# Patient Record
Sex: Male | Born: 1955 | Race: Black or African American | Hispanic: No | Marital: Married | Smoking: Former smoker
Health system: Southern US, Community
[De-identification: ages and names within clinical notes are randomized; demographics above are authoritative.]

## PROBLEM LIST (undated history)

## (undated) ENCOUNTER — Emergency Department (HOSPITAL_COMMUNITY): Payer: No Typology Code available for payment source

## (undated) ENCOUNTER — Emergency Department (HOSPITAL_COMMUNITY): Admission: EM | Payer: Medicare HMO | Source: Home / Self Care

## (undated) DIAGNOSIS — T7840XA Allergy, unspecified, initial encounter: Secondary | ICD-10-CM

## (undated) DIAGNOSIS — K635 Polyp of colon: Secondary | ICD-10-CM

## (undated) DIAGNOSIS — M1990 Unspecified osteoarthritis, unspecified site: Secondary | ICD-10-CM

## (undated) DIAGNOSIS — H269 Unspecified cataract: Secondary | ICD-10-CM

## (undated) DIAGNOSIS — I2510 Atherosclerotic heart disease of native coronary artery without angina pectoris: Secondary | ICD-10-CM

## (undated) DIAGNOSIS — A630 Anogenital (venereal) warts: Secondary | ICD-10-CM

## (undated) DIAGNOSIS — I219 Acute myocardial infarction, unspecified: Secondary | ICD-10-CM

## (undated) DIAGNOSIS — E785 Hyperlipidemia, unspecified: Secondary | ICD-10-CM

## (undated) DIAGNOSIS — Z89421 Acquired absence of other right toe(s): Secondary | ICD-10-CM

## (undated) DIAGNOSIS — I739 Peripheral vascular disease, unspecified: Secondary | ICD-10-CM

## (undated) DIAGNOSIS — N189 Chronic kidney disease, unspecified: Secondary | ICD-10-CM

## (undated) DIAGNOSIS — J189 Pneumonia, unspecified organism: Secondary | ICD-10-CM

## (undated) DIAGNOSIS — J449 Chronic obstructive pulmonary disease, unspecified: Secondary | ICD-10-CM

## (undated) DIAGNOSIS — M869 Osteomyelitis, unspecified: Secondary | ICD-10-CM

## (undated) DIAGNOSIS — J40 Bronchitis, not specified as acute or chronic: Secondary | ICD-10-CM

## (undated) DIAGNOSIS — I10 Essential (primary) hypertension: Secondary | ICD-10-CM

## (undated) DIAGNOSIS — I509 Heart failure, unspecified: Secondary | ICD-10-CM

## (undated) HISTORY — DX: Hyperlipidemia, unspecified: E78.5

## (undated) HISTORY — DX: Essential (primary) hypertension: I10

## (undated) HISTORY — PX: COLONOSCOPY: SHX174

## (undated) HISTORY — DX: Chronic kidney disease, unspecified: N18.9

## (undated) HISTORY — DX: Osteomyelitis, unspecified: M86.9

## (undated) HISTORY — DX: Allergy, unspecified, initial encounter: T78.40XA

## (undated) HISTORY — DX: Peripheral vascular disease, unspecified: I73.9

## (undated) HISTORY — DX: Acquired absence of other right toe(s): Z89.421

## (undated) HISTORY — DX: Anogenital (venereal) warts: A63.0

## (undated) HISTORY — PX: OTHER SURGICAL HISTORY: SHX169

## (undated) HISTORY — DX: Polyp of colon: K63.5

## (undated) HISTORY — PX: CATARACT EXTRACTION: SUR2

## (undated) HISTORY — DX: Chronic obstructive pulmonary disease, unspecified: J44.9

## (undated) HISTORY — DX: Unspecified cataract: H26.9

## (undated) HISTORY — DX: Unspecified osteoarthritis, unspecified site: M19.90

---

## 1997-12-28 ENCOUNTER — Encounter: Admission: RE | Admit: 1997-12-28 | Discharge: 1997-12-28 | Payer: Self-pay | Admitting: Internal Medicine

## 1998-07-05 ENCOUNTER — Emergency Department (HOSPITAL_COMMUNITY): Admission: EM | Admit: 1998-07-05 | Discharge: 1998-07-05 | Payer: Self-pay | Admitting: Emergency Medicine

## 1998-09-18 ENCOUNTER — Encounter: Admission: RE | Admit: 1998-09-18 | Discharge: 1998-09-18 | Payer: Self-pay | Admitting: Internal Medicine

## 1998-09-19 ENCOUNTER — Encounter: Admission: RE | Admit: 1998-09-19 | Discharge: 1998-09-19 | Payer: Self-pay | Admitting: Hematology and Oncology

## 1998-10-03 ENCOUNTER — Encounter: Admission: RE | Admit: 1998-10-03 | Discharge: 1998-10-03 | Payer: Self-pay | Admitting: Hematology and Oncology

## 1998-11-10 ENCOUNTER — Encounter: Admission: RE | Admit: 1998-11-10 | Discharge: 1998-11-10 | Payer: Self-pay | Admitting: Internal Medicine

## 1999-01-05 ENCOUNTER — Encounter: Admission: RE | Admit: 1999-01-05 | Discharge: 1999-01-05 | Payer: Self-pay | Admitting: Internal Medicine

## 1999-02-23 ENCOUNTER — Encounter: Admission: RE | Admit: 1999-02-23 | Discharge: 1999-02-23 | Payer: Self-pay | Admitting: Internal Medicine

## 1999-03-30 ENCOUNTER — Encounter: Admission: RE | Admit: 1999-03-30 | Discharge: 1999-03-30 | Payer: Self-pay | Admitting: Internal Medicine

## 1999-04-27 ENCOUNTER — Encounter: Admission: RE | Admit: 1999-04-27 | Discharge: 1999-04-27 | Payer: Self-pay | Admitting: Internal Medicine

## 1999-05-10 ENCOUNTER — Encounter: Admission: RE | Admit: 1999-05-10 | Discharge: 1999-08-08 | Payer: Self-pay | Admitting: Internal Medicine

## 1999-12-26 ENCOUNTER — Encounter: Admission: RE | Admit: 1999-12-26 | Discharge: 1999-12-26 | Payer: Self-pay | Admitting: Internal Medicine

## 2000-10-14 ENCOUNTER — Emergency Department (HOSPITAL_COMMUNITY): Admission: EM | Admit: 2000-10-14 | Discharge: 2000-10-14 | Payer: Self-pay | Admitting: Emergency Medicine

## 2000-10-14 ENCOUNTER — Encounter: Payer: Self-pay | Admitting: Emergency Medicine

## 2001-03-26 ENCOUNTER — Encounter: Admission: RE | Admit: 2001-03-26 | Discharge: 2001-03-26 | Payer: Self-pay | Admitting: Internal Medicine

## 2001-12-08 ENCOUNTER — Emergency Department (HOSPITAL_COMMUNITY): Admission: EM | Admit: 2001-12-08 | Discharge: 2001-12-08 | Payer: Self-pay | Admitting: Emergency Medicine

## 2002-10-17 ENCOUNTER — Encounter: Payer: Self-pay | Admitting: Emergency Medicine

## 2002-10-17 ENCOUNTER — Emergency Department (HOSPITAL_COMMUNITY): Admission: EM | Admit: 2002-10-17 | Discharge: 2002-10-17 | Payer: Self-pay | Admitting: Emergency Medicine

## 2003-11-04 ENCOUNTER — Emergency Department (HOSPITAL_COMMUNITY): Admission: EM | Admit: 2003-11-04 | Discharge: 2003-11-04 | Payer: Self-pay | Admitting: Family Medicine

## 2004-01-12 ENCOUNTER — Emergency Department (HOSPITAL_COMMUNITY): Admission: EM | Admit: 2004-01-12 | Discharge: 2004-01-12 | Payer: Self-pay | Admitting: Family Medicine

## 2004-02-02 ENCOUNTER — Emergency Department (HOSPITAL_COMMUNITY): Admission: EM | Admit: 2004-02-02 | Discharge: 2004-02-02 | Payer: Self-pay | Admitting: Family Medicine

## 2004-03-28 ENCOUNTER — Ambulatory Visit: Payer: Self-pay | Admitting: Internal Medicine

## 2004-03-29 ENCOUNTER — Ambulatory Visit (HOSPITAL_COMMUNITY): Admission: RE | Admit: 2004-03-29 | Discharge: 2004-03-29 | Payer: Self-pay | Admitting: Internal Medicine

## 2004-04-09 ENCOUNTER — Ambulatory Visit: Payer: Self-pay | Admitting: Internal Medicine

## 2004-10-19 ENCOUNTER — Ambulatory Visit: Payer: Self-pay | Admitting: Internal Medicine

## 2004-12-25 ENCOUNTER — Ambulatory Visit: Payer: Self-pay | Admitting: Internal Medicine

## 2004-12-31 ENCOUNTER — Ambulatory Visit: Payer: Self-pay | Admitting: Family Medicine

## 2005-02-02 ENCOUNTER — Emergency Department (HOSPITAL_COMMUNITY): Admission: EM | Admit: 2005-02-02 | Discharge: 2005-02-02 | Payer: Self-pay | Admitting: Emergency Medicine

## 2005-02-03 ENCOUNTER — Emergency Department (HOSPITAL_COMMUNITY): Admission: EM | Admit: 2005-02-03 | Discharge: 2005-02-03 | Payer: Self-pay | Admitting: Emergency Medicine

## 2005-02-04 ENCOUNTER — Emergency Department (HOSPITAL_COMMUNITY): Admission: EM | Admit: 2005-02-04 | Discharge: 2005-02-04 | Payer: Self-pay | Admitting: Family Medicine

## 2005-02-04 ENCOUNTER — Encounter (HOSPITAL_BASED_OUTPATIENT_CLINIC_OR_DEPARTMENT_OTHER): Admission: RE | Admit: 2005-02-04 | Discharge: 2005-05-05 | Payer: Self-pay | Admitting: Surgery

## 2005-02-05 ENCOUNTER — Ambulatory Visit (HOSPITAL_COMMUNITY): Admission: RE | Admit: 2005-02-05 | Discharge: 2005-02-05 | Payer: Self-pay | Admitting: Internal Medicine

## 2005-04-25 ENCOUNTER — Ambulatory Visit: Payer: Self-pay | Admitting: Internal Medicine

## 2005-04-25 ENCOUNTER — Inpatient Hospital Stay (HOSPITAL_COMMUNITY): Admission: AD | Admit: 2005-04-25 | Discharge: 2005-05-01 | Payer: Self-pay | Admitting: Hospitalist

## 2005-04-25 ENCOUNTER — Ambulatory Visit: Payer: Self-pay | Admitting: Hospitalist

## 2005-05-14 ENCOUNTER — Ambulatory Visit: Payer: Self-pay | Admitting: Internal Medicine

## 2005-06-05 ENCOUNTER — Emergency Department (HOSPITAL_COMMUNITY): Admission: EM | Admit: 2005-06-05 | Discharge: 2005-06-05 | Payer: Self-pay | Admitting: Family Medicine

## 2005-07-24 ENCOUNTER — Ambulatory Visit: Payer: Self-pay | Admitting: Internal Medicine

## 2005-07-29 ENCOUNTER — Encounter (INDEPENDENT_AMBULATORY_CARE_PROVIDER_SITE_OTHER): Payer: Self-pay | Admitting: *Deleted

## 2005-07-29 ENCOUNTER — Ambulatory Visit: Payer: Self-pay | Admitting: Internal Medicine

## 2005-07-29 LAB — CONVERTED CEMR LAB: Hgb A1c MFr Bld: 11.6 %

## 2005-08-06 ENCOUNTER — Encounter (HOSPITAL_BASED_OUTPATIENT_CLINIC_OR_DEPARTMENT_OTHER): Admission: RE | Admit: 2005-08-06 | Discharge: 2005-11-04 | Payer: Self-pay | Admitting: Surgery

## 2005-08-15 ENCOUNTER — Ambulatory Visit (HOSPITAL_COMMUNITY): Admission: RE | Admit: 2005-08-15 | Discharge: 2005-08-15 | Payer: Self-pay | Admitting: Internal Medicine

## 2005-11-07 ENCOUNTER — Encounter (HOSPITAL_BASED_OUTPATIENT_CLINIC_OR_DEPARTMENT_OTHER): Admission: RE | Admit: 2005-11-07 | Discharge: 2005-12-12 | Payer: Self-pay | Admitting: Surgery

## 2005-11-19 ENCOUNTER — Encounter (INDEPENDENT_AMBULATORY_CARE_PROVIDER_SITE_OTHER): Payer: Self-pay | Admitting: *Deleted

## 2005-11-19 ENCOUNTER — Ambulatory Visit: Payer: Self-pay | Admitting: Internal Medicine

## 2005-11-19 LAB — CONVERTED CEMR LAB: Hgb A1c MFr Bld: 9.6 %

## 2005-11-29 ENCOUNTER — Ambulatory Visit: Payer: Self-pay | Admitting: Internal Medicine

## 2005-11-29 ENCOUNTER — Encounter (INDEPENDENT_AMBULATORY_CARE_PROVIDER_SITE_OTHER): Payer: Self-pay | Admitting: *Deleted

## 2005-11-29 LAB — CONVERTED CEMR LAB: TSH: 0.351 microintl units/mL

## 2005-12-24 ENCOUNTER — Ambulatory Visit: Payer: Self-pay | Admitting: Internal Medicine

## 2005-12-26 ENCOUNTER — Ambulatory Visit (HOSPITAL_COMMUNITY): Admission: RE | Admit: 2005-12-26 | Discharge: 2005-12-26 | Payer: Self-pay | Admitting: Internal Medicine

## 2006-01-30 ENCOUNTER — Ambulatory Visit: Payer: Self-pay | Admitting: Hospitalist

## 2006-02-14 ENCOUNTER — Ambulatory Visit: Payer: Self-pay | Admitting: Internal Medicine

## 2006-02-14 ENCOUNTER — Encounter (INDEPENDENT_AMBULATORY_CARE_PROVIDER_SITE_OTHER): Payer: Self-pay | Admitting: *Deleted

## 2006-02-14 ENCOUNTER — Ambulatory Visit (HOSPITAL_COMMUNITY): Admission: RE | Admit: 2006-02-14 | Discharge: 2006-02-14 | Payer: Self-pay | Admitting: Internal Medicine

## 2006-02-14 LAB — CONVERTED CEMR LAB: Hgb A1c MFr Bld: 10 %

## 2006-02-19 ENCOUNTER — Ambulatory Visit: Payer: Self-pay | Admitting: Internal Medicine

## 2006-02-21 ENCOUNTER — Ambulatory Visit: Payer: Self-pay | Admitting: Internal Medicine

## 2006-02-24 ENCOUNTER — Ambulatory Visit (HOSPITAL_COMMUNITY): Admission: RE | Admit: 2006-02-24 | Discharge: 2006-02-24 | Payer: Self-pay | Admitting: *Deleted

## 2006-02-26 ENCOUNTER — Ambulatory Visit (HOSPITAL_COMMUNITY): Admission: RE | Admit: 2006-02-26 | Discharge: 2006-02-26 | Payer: Self-pay | Admitting: *Deleted

## 2006-02-27 ENCOUNTER — Ambulatory Visit: Payer: Self-pay | Admitting: Hospitalist

## 2006-04-26 ENCOUNTER — Encounter: Payer: Self-pay | Admitting: *Deleted

## 2006-04-26 DIAGNOSIS — F528 Other sexual dysfunction not due to a substance or known physiological condition: Secondary | ICD-10-CM | POA: Insufficient documentation

## 2006-04-26 DIAGNOSIS — D573 Sickle-cell trait: Secondary | ICD-10-CM | POA: Insufficient documentation

## 2006-04-26 DIAGNOSIS — I739 Peripheral vascular disease, unspecified: Secondary | ICD-10-CM | POA: Insufficient documentation

## 2006-04-26 DIAGNOSIS — Z720 Tobacco use: Secondary | ICD-10-CM | POA: Insufficient documentation

## 2006-04-26 DIAGNOSIS — J309 Allergic rhinitis, unspecified: Secondary | ICD-10-CM | POA: Insufficient documentation

## 2006-05-05 ENCOUNTER — Ambulatory Visit: Payer: Self-pay | Admitting: Hospitalist

## 2006-05-05 ENCOUNTER — Encounter (INDEPENDENT_AMBULATORY_CARE_PROVIDER_SITE_OTHER): Payer: Self-pay | Admitting: *Deleted

## 2006-05-05 LAB — CONVERTED CEMR LAB: Hgb A1c MFr Bld: 8.5 %

## 2006-05-26 ENCOUNTER — Ambulatory Visit: Payer: Self-pay | Admitting: Internal Medicine

## 2006-08-11 ENCOUNTER — Emergency Department (HOSPITAL_COMMUNITY): Admission: EM | Admit: 2006-08-11 | Discharge: 2006-08-11 | Payer: Self-pay | Admitting: Family Medicine

## 2006-08-25 ENCOUNTER — Ambulatory Visit: Payer: Self-pay | Admitting: Family Medicine

## 2006-10-06 ENCOUNTER — Ambulatory Visit: Payer: Self-pay | Admitting: Sports Medicine

## 2006-10-06 DIAGNOSIS — J449 Chronic obstructive pulmonary disease, unspecified: Secondary | ICD-10-CM | POA: Insufficient documentation

## 2006-10-07 ENCOUNTER — Encounter (INDEPENDENT_AMBULATORY_CARE_PROVIDER_SITE_OTHER): Payer: Self-pay | Admitting: *Deleted

## 2006-10-07 ENCOUNTER — Ambulatory Visit: Payer: Self-pay | Admitting: Family Medicine

## 2006-10-07 LAB — CONVERTED CEMR LAB
HCT: 41.4 %
Hgb A1c MFr Bld: 9.4 %
MCV: 86.3 fL
Platelets: 147 10*3/uL
RBC: 4.8 M/uL
WBC Urine, dipstick: NEGATIVE
WBC: 4.5 10*3/uL

## 2006-10-13 ENCOUNTER — Ambulatory Visit (HOSPITAL_COMMUNITY): Admission: RE | Admit: 2006-10-13 | Discharge: 2006-10-13 | Payer: Self-pay | Admitting: Family Medicine

## 2006-10-24 ENCOUNTER — Ambulatory Visit: Payer: Self-pay | Admitting: Gastroenterology

## 2006-11-03 ENCOUNTER — Encounter (INDEPENDENT_AMBULATORY_CARE_PROVIDER_SITE_OTHER): Payer: Self-pay | Admitting: Family Medicine

## 2006-11-03 ENCOUNTER — Ambulatory Visit: Payer: Self-pay | Admitting: Gastroenterology

## 2006-11-03 ENCOUNTER — Encounter (INDEPENDENT_AMBULATORY_CARE_PROVIDER_SITE_OTHER): Payer: Self-pay | Admitting: Specialist

## 2006-11-10 ENCOUNTER — Telehealth (INDEPENDENT_AMBULATORY_CARE_PROVIDER_SITE_OTHER): Payer: Self-pay | Admitting: *Deleted

## 2006-11-17 LAB — CONVERTED CEMR LAB
ALT: 17 units/L (ref 0–53)
CO2: 23 meq/L (ref 19–32)
Chloride: 108 meq/L (ref 96–112)
Creatinine, Ser: 1.01 mg/dL (ref 0.40–1.50)
Glucose, Bld: 52 mg/dL — ABNORMAL LOW (ref 70–99)
LDL Cholesterol: 56 mg/dL (ref 0–99)
PSA: 0.12 ng/mL (ref 0.10–4.00)
Sodium: 142 meq/L (ref 135–145)
Total Bilirubin: 0.3 mg/dL (ref 0.3–1.2)
Total CHOL/HDL Ratio: 3.9
Triglycerides: 268 mg/dL — ABNORMAL HIGH (ref <150)
VLDL: 54 mg/dL — ABNORMAL HIGH (ref 0–40)

## 2006-12-01 ENCOUNTER — Encounter (HOSPITAL_BASED_OUTPATIENT_CLINIC_OR_DEPARTMENT_OTHER): Admission: RE | Admit: 2006-12-01 | Discharge: 2006-12-08 | Payer: Self-pay | Admitting: Surgery

## 2007-01-07 ENCOUNTER — Ambulatory Visit: Payer: Self-pay | Admitting: Family Medicine

## 2007-01-07 LAB — CONVERTED CEMR LAB: Hgb A1c MFr Bld: 8.1 %

## 2007-01-14 ENCOUNTER — Encounter (INDEPENDENT_AMBULATORY_CARE_PROVIDER_SITE_OTHER): Payer: Self-pay | Admitting: Family Medicine

## 2007-02-06 ENCOUNTER — Ambulatory Visit: Payer: Self-pay | Admitting: Family Medicine

## 2007-02-09 ENCOUNTER — Encounter: Payer: Self-pay | Admitting: Family Medicine

## 2007-02-09 ENCOUNTER — Ambulatory Visit: Payer: Self-pay | Admitting: Sports Medicine

## 2007-02-09 LAB — CONVERTED CEMR LAB

## 2007-02-10 ENCOUNTER — Encounter: Payer: Self-pay | Admitting: *Deleted

## 2007-02-10 ENCOUNTER — Emergency Department (HOSPITAL_COMMUNITY): Admission: EM | Admit: 2007-02-10 | Discharge: 2007-02-10 | Payer: Self-pay | Admitting: Emergency Medicine

## 2007-02-11 ENCOUNTER — Encounter (INDEPENDENT_AMBULATORY_CARE_PROVIDER_SITE_OTHER): Payer: Self-pay | Admitting: Family Medicine

## 2007-03-24 ENCOUNTER — Telehealth (INDEPENDENT_AMBULATORY_CARE_PROVIDER_SITE_OTHER): Payer: Self-pay | Admitting: *Deleted

## 2007-03-24 ENCOUNTER — Ambulatory Visit: Payer: Self-pay | Admitting: Family Medicine

## 2007-05-20 ENCOUNTER — Emergency Department (HOSPITAL_COMMUNITY): Admission: EM | Admit: 2007-05-20 | Discharge: 2007-05-20 | Payer: Self-pay | Admitting: Emergency Medicine

## 2007-05-21 ENCOUNTER — Encounter: Payer: Self-pay | Admitting: Family Medicine

## 2007-05-21 ENCOUNTER — Ambulatory Visit: Payer: Self-pay | Admitting: Family Medicine

## 2007-05-22 ENCOUNTER — Telehealth: Payer: Self-pay | Admitting: Family Medicine

## 2007-05-22 ENCOUNTER — Telehealth: Payer: Self-pay | Admitting: *Deleted

## 2007-05-26 ENCOUNTER — Telehealth (INDEPENDENT_AMBULATORY_CARE_PROVIDER_SITE_OTHER): Payer: Self-pay | Admitting: *Deleted

## 2007-05-26 ENCOUNTER — Ambulatory Visit (HOSPITAL_COMMUNITY): Admission: RE | Admit: 2007-05-26 | Discharge: 2007-05-26 | Payer: Self-pay | Admitting: Family Medicine

## 2007-06-09 ENCOUNTER — Encounter (HOSPITAL_BASED_OUTPATIENT_CLINIC_OR_DEPARTMENT_OTHER): Admission: RE | Admit: 2007-06-09 | Discharge: 2007-07-15 | Payer: Self-pay | Admitting: Surgery

## 2007-07-01 ENCOUNTER — Encounter (INDEPENDENT_AMBULATORY_CARE_PROVIDER_SITE_OTHER): Payer: Self-pay | Admitting: Family Medicine

## 2007-07-01 ENCOUNTER — Ambulatory Visit: Payer: Self-pay | Admitting: Family Medicine

## 2007-07-01 DIAGNOSIS — R809 Proteinuria, unspecified: Secondary | ICD-10-CM | POA: Insufficient documentation

## 2007-07-01 LAB — CONVERTED CEMR LAB: Hgb A1c MFr Bld: 8.9 %

## 2007-07-02 ENCOUNTER — Telehealth (INDEPENDENT_AMBULATORY_CARE_PROVIDER_SITE_OTHER): Payer: Self-pay | Admitting: *Deleted

## 2007-07-02 LAB — CONVERTED CEMR LAB
ALT: 18 units/L (ref 0–53)
BUN: 21 mg/dL (ref 6–23)
CO2: 25 meq/L (ref 19–32)
Calcium: 9.6 mg/dL (ref 8.4–10.5)
Chloride: 104 meq/L (ref 96–112)
Creatinine, Ser: 1.08 mg/dL (ref 0.40–1.50)
Triglycerides: 94 mg/dL (ref <150)

## 2007-07-03 ENCOUNTER — Telehealth (INDEPENDENT_AMBULATORY_CARE_PROVIDER_SITE_OTHER): Payer: Self-pay | Admitting: Family Medicine

## 2007-07-06 ENCOUNTER — Emergency Department (HOSPITAL_COMMUNITY): Admission: EM | Admit: 2007-07-06 | Discharge: 2007-07-06 | Payer: Self-pay | Admitting: Family Medicine

## 2007-07-09 ENCOUNTER — Telehealth: Payer: Self-pay | Admitting: *Deleted

## 2007-07-09 ENCOUNTER — Ambulatory Visit: Payer: Self-pay | Admitting: Family Medicine

## 2007-07-16 ENCOUNTER — Encounter (HOSPITAL_BASED_OUTPATIENT_CLINIC_OR_DEPARTMENT_OTHER): Admission: RE | Admit: 2007-07-16 | Discharge: 2007-10-14 | Payer: Self-pay | Admitting: Internal Medicine

## 2007-07-16 ENCOUNTER — Ambulatory Visit: Payer: Self-pay | Admitting: Family Medicine

## 2007-07-17 ENCOUNTER — Encounter (INDEPENDENT_AMBULATORY_CARE_PROVIDER_SITE_OTHER): Payer: Self-pay | Admitting: Family Medicine

## 2007-07-18 ENCOUNTER — Ambulatory Visit (HOSPITAL_COMMUNITY): Admission: RE | Admit: 2007-07-18 | Discharge: 2007-07-18 | Payer: Self-pay | Admitting: Internal Medicine

## 2007-07-20 ENCOUNTER — Emergency Department (HOSPITAL_COMMUNITY): Admission: EM | Admit: 2007-07-20 | Discharge: 2007-07-20 | Payer: Self-pay | Admitting: Family Medicine

## 2007-07-22 ENCOUNTER — Encounter (INDEPENDENT_AMBULATORY_CARE_PROVIDER_SITE_OTHER): Payer: Self-pay | Admitting: Family Medicine

## 2007-07-24 ENCOUNTER — Other Ambulatory Visit: Payer: Self-pay | Admitting: Emergency Medicine

## 2007-07-24 ENCOUNTER — Encounter: Payer: Self-pay | Admitting: Family Medicine

## 2007-07-24 ENCOUNTER — Telehealth (INDEPENDENT_AMBULATORY_CARE_PROVIDER_SITE_OTHER): Payer: Self-pay | Admitting: Family Medicine

## 2007-07-25 ENCOUNTER — Encounter (INDEPENDENT_AMBULATORY_CARE_PROVIDER_SITE_OTHER): Payer: Self-pay | Admitting: Family Medicine

## 2007-07-25 ENCOUNTER — Ambulatory Visit: Payer: Self-pay | Admitting: Family Medicine

## 2007-07-25 ENCOUNTER — Inpatient Hospital Stay (HOSPITAL_COMMUNITY): Admission: EM | Admit: 2007-07-25 | Discharge: 2007-07-31 | Payer: Self-pay | Admitting: Family Medicine

## 2007-07-27 ENCOUNTER — Encounter (INDEPENDENT_AMBULATORY_CARE_PROVIDER_SITE_OTHER): Payer: Self-pay | Admitting: *Deleted

## 2007-07-28 ENCOUNTER — Encounter: Payer: Self-pay | Admitting: Family Medicine

## 2007-07-29 ENCOUNTER — Encounter (INDEPENDENT_AMBULATORY_CARE_PROVIDER_SITE_OTHER): Payer: Self-pay | Admitting: Orthopedic Surgery

## 2007-08-06 ENCOUNTER — Ambulatory Visit: Payer: Self-pay | Admitting: Family Medicine

## 2007-08-10 ENCOUNTER — Telehealth (INDEPENDENT_AMBULATORY_CARE_PROVIDER_SITE_OTHER): Payer: Self-pay | Admitting: Family Medicine

## 2007-08-18 ENCOUNTER — Telehealth (INDEPENDENT_AMBULATORY_CARE_PROVIDER_SITE_OTHER): Payer: Self-pay | Admitting: Family Medicine

## 2007-08-25 ENCOUNTER — Telehealth: Payer: Self-pay | Admitting: *Deleted

## 2007-09-04 ENCOUNTER — Ambulatory Visit: Payer: Self-pay | Admitting: Family Medicine

## 2007-10-15 ENCOUNTER — Ambulatory Visit: Payer: Self-pay | Admitting: Family Medicine

## 2007-10-15 DIAGNOSIS — M79609 Pain in unspecified limb: Secondary | ICD-10-CM | POA: Insufficient documentation

## 2007-10-16 ENCOUNTER — Encounter (INDEPENDENT_AMBULATORY_CARE_PROVIDER_SITE_OTHER): Payer: Self-pay | Admitting: Family Medicine

## 2007-10-16 ENCOUNTER — Ambulatory Visit: Payer: Self-pay | Admitting: Family Medicine

## 2007-10-22 ENCOUNTER — Encounter (INDEPENDENT_AMBULATORY_CARE_PROVIDER_SITE_OTHER): Payer: Self-pay | Admitting: Family Medicine

## 2007-10-22 LAB — CONVERTED CEMR LAB
BUN: 18 mg/dL (ref 6–23)
Calcium: 9.3 mg/dL (ref 8.4–10.5)
Potassium: 4.3 meq/L (ref 3.5–5.3)
Sodium: 142 meq/L (ref 135–145)

## 2008-02-10 ENCOUNTER — Telehealth (INDEPENDENT_AMBULATORY_CARE_PROVIDER_SITE_OTHER): Payer: Self-pay | Admitting: Family Medicine

## 2008-03-04 ENCOUNTER — Ambulatory Visit: Payer: Self-pay | Admitting: Family Medicine

## 2008-03-04 DIAGNOSIS — N433 Hydrocele, unspecified: Secondary | ICD-10-CM | POA: Insufficient documentation

## 2008-03-04 DIAGNOSIS — N62 Hypertrophy of breast: Secondary | ICD-10-CM | POA: Insufficient documentation

## 2008-03-04 LAB — CONVERTED CEMR LAB: Hgb A1c MFr Bld: 9.4 %

## 2008-03-17 ENCOUNTER — Ambulatory Visit: Payer: Self-pay | Admitting: Family Medicine

## 2008-03-17 DIAGNOSIS — IMO0002 Reserved for concepts with insufficient information to code with codable children: Secondary | ICD-10-CM | POA: Insufficient documentation

## 2008-03-18 ENCOUNTER — Telehealth (INDEPENDENT_AMBULATORY_CARE_PROVIDER_SITE_OTHER): Payer: Self-pay | Admitting: Family Medicine

## 2008-03-18 ENCOUNTER — Encounter (INDEPENDENT_AMBULATORY_CARE_PROVIDER_SITE_OTHER): Payer: Self-pay | Admitting: Family Medicine

## 2008-04-26 ENCOUNTER — Emergency Department (HOSPITAL_COMMUNITY): Admission: EM | Admit: 2008-04-26 | Discharge: 2008-04-26 | Payer: Self-pay | Admitting: Emergency Medicine

## 2008-05-04 ENCOUNTER — Telehealth: Payer: Self-pay | Admitting: *Deleted

## 2008-05-25 ENCOUNTER — Encounter (INDEPENDENT_AMBULATORY_CARE_PROVIDER_SITE_OTHER): Payer: Self-pay | Admitting: *Deleted

## 2008-06-01 ENCOUNTER — Ambulatory Visit: Payer: Self-pay | Admitting: Family Medicine

## 2008-06-01 DIAGNOSIS — L84 Corns and callosities: Secondary | ICD-10-CM | POA: Insufficient documentation

## 2008-06-01 LAB — CONVERTED CEMR LAB: Hgb A1c MFr Bld: 10.3 %

## 2008-06-09 ENCOUNTER — Encounter (INDEPENDENT_AMBULATORY_CARE_PROVIDER_SITE_OTHER): Payer: Self-pay | Admitting: Family Medicine

## 2008-06-30 ENCOUNTER — Emergency Department (HOSPITAL_COMMUNITY): Admission: EM | Admit: 2008-06-30 | Discharge: 2008-06-30 | Payer: Self-pay | Admitting: Family Medicine

## 2008-07-08 ENCOUNTER — Encounter (HOSPITAL_BASED_OUTPATIENT_CLINIC_OR_DEPARTMENT_OTHER): Admission: RE | Admit: 2008-07-08 | Discharge: 2008-08-08 | Payer: Self-pay | Admitting: Internal Medicine

## 2008-07-11 ENCOUNTER — Telehealth: Payer: Self-pay | Admitting: *Deleted

## 2008-07-12 ENCOUNTER — Ambulatory Visit: Payer: Self-pay | Admitting: Family Medicine

## 2008-07-21 ENCOUNTER — Encounter (INDEPENDENT_AMBULATORY_CARE_PROVIDER_SITE_OTHER): Payer: Self-pay | Admitting: Family Medicine

## 2008-07-21 ENCOUNTER — Ambulatory Visit: Payer: Self-pay | Admitting: Family Medicine

## 2008-07-21 LAB — CONVERTED CEMR LAB
ALT: 12 units/L (ref 0–53)
AST: 11 units/L (ref 0–37)
Alkaline Phosphatase: 140 units/L — ABNORMAL HIGH (ref 39–117)
Calcium: 8.9 mg/dL (ref 8.4–10.5)
Chloride: 106 meq/L (ref 96–112)
Creatinine, Ser: 1.08 mg/dL (ref 0.40–1.50)

## 2008-07-24 ENCOUNTER — Encounter (INDEPENDENT_AMBULATORY_CARE_PROVIDER_SITE_OTHER): Payer: Self-pay | Admitting: Family Medicine

## 2008-07-26 ENCOUNTER — Telehealth (INDEPENDENT_AMBULATORY_CARE_PROVIDER_SITE_OTHER): Payer: Self-pay | Admitting: Family Medicine

## 2008-08-10 ENCOUNTER — Emergency Department (HOSPITAL_COMMUNITY): Admission: EM | Admit: 2008-08-10 | Discharge: 2008-08-10 | Payer: Self-pay | Admitting: Family Medicine

## 2008-08-23 ENCOUNTER — Ambulatory Visit: Payer: Self-pay | Admitting: Family Medicine

## 2008-08-23 ENCOUNTER — Telehealth: Payer: Self-pay | Admitting: Family Medicine

## 2008-08-31 ENCOUNTER — Ambulatory Visit: Payer: Self-pay | Admitting: Family Medicine

## 2008-09-01 ENCOUNTER — Emergency Department (HOSPITAL_COMMUNITY): Admission: EM | Admit: 2008-09-01 | Discharge: 2008-09-01 | Payer: Self-pay | Admitting: Family Medicine

## 2008-11-09 ENCOUNTER — Encounter (INDEPENDENT_AMBULATORY_CARE_PROVIDER_SITE_OTHER): Payer: Self-pay | Admitting: Family Medicine

## 2008-11-09 ENCOUNTER — Ambulatory Visit: Payer: Self-pay | Admitting: Family Medicine

## 2008-11-09 DIAGNOSIS — I152 Hypertension secondary to endocrine disorders: Secondary | ICD-10-CM | POA: Insufficient documentation

## 2008-11-09 DIAGNOSIS — I10 Essential (primary) hypertension: Secondary | ICD-10-CM | POA: Insufficient documentation

## 2008-11-09 LAB — CONVERTED CEMR LAB
Cholesterol: 140 mg/dL (ref 0–200)
HDL: 38 mg/dL — ABNORMAL LOW (ref >39)
Hgb A1c MFr Bld: 9.4 %
Total CHOL/HDL Ratio: 3.7
Triglycerides: 137 mg/dL (ref <150)

## 2008-11-10 ENCOUNTER — Encounter (INDEPENDENT_AMBULATORY_CARE_PROVIDER_SITE_OTHER): Payer: Self-pay | Admitting: Family Medicine

## 2008-12-08 ENCOUNTER — Telehealth (INDEPENDENT_AMBULATORY_CARE_PROVIDER_SITE_OTHER): Payer: Self-pay | Admitting: Family Medicine

## 2008-12-23 ENCOUNTER — Encounter (INDEPENDENT_AMBULATORY_CARE_PROVIDER_SITE_OTHER): Payer: Self-pay | Admitting: Family Medicine

## 2008-12-28 ENCOUNTER — Encounter (INDEPENDENT_AMBULATORY_CARE_PROVIDER_SITE_OTHER): Payer: Self-pay | Admitting: Family Medicine

## 2009-02-03 ENCOUNTER — Telehealth: Payer: Self-pay | Admitting: *Deleted

## 2009-02-08 ENCOUNTER — Encounter: Payer: Self-pay | Admitting: Family Medicine

## 2009-02-22 ENCOUNTER — Emergency Department (HOSPITAL_COMMUNITY): Admission: EM | Admit: 2009-02-22 | Discharge: 2009-02-22 | Payer: Self-pay | Admitting: Family Medicine

## 2009-02-27 ENCOUNTER — Encounter: Payer: Self-pay | Admitting: Family Medicine

## 2009-02-27 ENCOUNTER — Emergency Department (HOSPITAL_COMMUNITY): Admission: EM | Admit: 2009-02-27 | Discharge: 2009-02-27 | Payer: Self-pay | Admitting: Family Medicine

## 2009-03-02 ENCOUNTER — Encounter: Payer: Self-pay | Admitting: Family Medicine

## 2009-03-03 ENCOUNTER — Ambulatory Visit: Payer: Self-pay | Admitting: Surgery

## 2009-03-03 ENCOUNTER — Ambulatory Visit: Payer: Self-pay | Admitting: Family Medicine

## 2009-03-03 ENCOUNTER — Ambulatory Visit: Admission: RE | Admit: 2009-03-03 | Discharge: 2009-03-03 | Payer: Self-pay | Admitting: Family Medicine

## 2009-03-03 ENCOUNTER — Encounter: Payer: Self-pay | Admitting: Family Medicine

## 2009-03-03 DIAGNOSIS — R609 Edema, unspecified: Secondary | ICD-10-CM | POA: Insufficient documentation

## 2009-03-03 LAB — CONVERTED CEMR LAB
Basophils Absolute: 0 10*3/uL (ref 0.0–0.1)
Basophils Relative: 0 % (ref 0–1)
Eosinophils Absolute: 0.2 10*3/uL (ref 0.0–0.7)
Eosinophils Relative: 2 % (ref 0–5)
HCT: 39.9 % (ref 39.0–52.0)
MCHC: 33.8 g/dL (ref 30.0–36.0)
MCV: 88.7 fL (ref 78.0–100.0)
Neutrophils Relative %: 56 % (ref 43–77)
Platelets: 176 10*3/uL (ref 150–400)
RDW: 13.3 % (ref 11.5–15.5)
WBC: 6.9 10*3/uL (ref 4.0–10.5)

## 2009-03-17 ENCOUNTER — Ambulatory Visit: Payer: Self-pay | Admitting: Family Medicine

## 2009-03-27 ENCOUNTER — Encounter: Payer: Self-pay | Admitting: *Deleted

## 2009-04-28 ENCOUNTER — Ambulatory Visit: Payer: Self-pay | Admitting: Family Medicine

## 2009-05-01 ENCOUNTER — Telehealth: Payer: Self-pay | Admitting: Family Medicine

## 2009-06-01 ENCOUNTER — Ambulatory Visit: Payer: Self-pay | Admitting: Family Medicine

## 2009-06-03 ENCOUNTER — Emergency Department (HOSPITAL_COMMUNITY): Admission: EM | Admit: 2009-06-03 | Discharge: 2009-06-03 | Payer: Self-pay | Admitting: Emergency Medicine

## 2009-07-06 ENCOUNTER — Telehealth: Payer: Self-pay | Admitting: *Deleted

## 2009-08-08 ENCOUNTER — Encounter: Payer: Self-pay | Admitting: Family Medicine

## 2009-09-05 ENCOUNTER — Encounter: Payer: Self-pay | Admitting: Family Medicine

## 2009-09-12 ENCOUNTER — Ambulatory Visit: Payer: Self-pay | Admitting: Family Medicine

## 2009-09-12 LAB — CONVERTED CEMR LAB: Hgb A1c MFr Bld: 8.8 %

## 2009-09-14 ENCOUNTER — Encounter: Payer: Self-pay | Admitting: *Deleted

## 2009-10-31 ENCOUNTER — Encounter: Payer: Self-pay | Admitting: Family Medicine

## 2009-10-31 ENCOUNTER — Ambulatory Visit: Payer: Self-pay | Admitting: Family Medicine

## 2009-10-31 LAB — CONVERTED CEMR LAB
CO2: 27 meq/L (ref 19–32)
Calcium: 9.1 mg/dL (ref 8.4–10.5)
Glucose, Bld: 160 mg/dL — ABNORMAL HIGH (ref 70–99)
Potassium: 4.4 meq/L (ref 3.5–5.3)
Sodium: 140 meq/L (ref 135–145)

## 2009-11-10 ENCOUNTER — Emergency Department (HOSPITAL_COMMUNITY): Admission: EM | Admit: 2009-11-10 | Discharge: 2009-11-10 | Payer: Self-pay | Admitting: Family Medicine

## 2009-12-14 ENCOUNTER — Ambulatory Visit: Payer: Self-pay | Admitting: Family Medicine

## 2009-12-14 ENCOUNTER — Encounter: Payer: Self-pay | Admitting: *Deleted

## 2009-12-15 ENCOUNTER — Encounter: Payer: Self-pay | Admitting: Family Medicine

## 2009-12-15 ENCOUNTER — Ambulatory Visit: Payer: Self-pay | Admitting: Family Medicine

## 2009-12-15 LAB — CONVERTED CEMR LAB
Cholesterol: 170 mg/dL (ref 0–200)
GC Probe Amp, Urine: NEGATIVE
Total CHOL/HDL Ratio: 4.4
Triglycerides: 174 mg/dL — ABNORMAL HIGH (ref <150)
VLDL: 35 mg/dL (ref 0–40)

## 2009-12-18 ENCOUNTER — Encounter: Payer: Self-pay | Admitting: Family Medicine

## 2010-04-03 ENCOUNTER — Encounter: Payer: Self-pay | Admitting: *Deleted

## 2010-04-12 ENCOUNTER — Telehealth: Payer: Self-pay | Admitting: Family Medicine

## 2010-04-13 ENCOUNTER — Ambulatory Visit: Payer: Self-pay | Admitting: Family Medicine

## 2010-04-13 ENCOUNTER — Encounter: Payer: Self-pay | Admitting: Family Medicine

## 2010-04-13 LAB — CONVERTED CEMR LAB
Blood in Urine, dipstick: NEGATIVE
Chlamydia, Swab/Urine, PCR: NEGATIVE
GC Probe Amp, Urine: NEGATIVE
Glucose, Urine, Semiquant: NEGATIVE
Ketones, urine, test strip: NEGATIVE
Protein, U semiquant: NEGATIVE
Specific Gravity, Urine: 1.02
WBC Urine, dipstick: NEGATIVE
pH: 8

## 2010-04-17 ENCOUNTER — Telehealth: Payer: Self-pay | Admitting: Family Medicine

## 2010-04-25 ENCOUNTER — Ambulatory Visit: Payer: Self-pay | Admitting: Family Medicine

## 2010-05-03 ENCOUNTER — Ambulatory Visit (HOSPITAL_COMMUNITY): Admission: RE | Admit: 2010-05-03 | Discharge: 2010-05-03 | Payer: Self-pay | Admitting: Family Medicine

## 2010-05-03 ENCOUNTER — Ambulatory Visit: Payer: Self-pay | Admitting: Cardiovascular Disease

## 2010-05-03 ENCOUNTER — Encounter: Payer: Self-pay | Admitting: Family Medicine

## 2010-05-07 ENCOUNTER — Encounter: Payer: Self-pay | Admitting: Family Medicine

## 2010-05-07 DIAGNOSIS — Q231 Congenital insufficiency of aortic valve: Secondary | ICD-10-CM | POA: Insufficient documentation

## 2010-06-13 ENCOUNTER — Ambulatory Visit: Payer: Self-pay | Admitting: Family Medicine

## 2010-06-21 ENCOUNTER — Encounter: Payer: Self-pay | Admitting: *Deleted

## 2010-07-22 ENCOUNTER — Encounter: Payer: Self-pay | Admitting: Internal Medicine

## 2010-07-22 ENCOUNTER — Encounter: Payer: Self-pay | Admitting: *Deleted

## 2010-07-31 NOTE — Progress Notes (Signed)
Summary: results  Phone Note Call from Patient Call back at Home Phone [phone]   Caller: Patient Summary of Call: wants to know results of test  Initial call taken by: De Nurse,  April 17, 2010 3:46 PM  Follow-up for Phone Call        Called patient and informed him of results Follow-up by: Everrett Coombe DO,  April 18, 2010 1:36 PM

## 2010-07-31 NOTE — Assessment & Plan Note (Signed)
Summary: Adam Ray   Vital Signs:  Patient profile:   55 year old male Height:      67.5 inches Weight:      167.5 pounds BMI:     25.94 Temp:     98.2 degrees F oral Pulse rate:   67 / minute BP sitting:   109 / 70  (left arm) Cuff size:   regular  Vitals Entered By: Garen Grams LPN (April 25, 2010 3:09 PM) CC: DM, HTN, leg edema Is Patient Diabetic? No Pain Assessment Patient in pain? no        Primary Care Provider:  Ellery Plunk MD  CC:  DM, HTN, and leg edema.  History of Present Illness: DM- insulin q night 51units each leg.  checking cbgs every once in a while.  they are usually 140.  sees podiatrist q3 months and sees opthomalogist   HTN-  taking meds.  avoid salt.  baked food.  no exercise.  last lipid check was 6/11 and was good.  leg edema-  taking lasix and going down.  does elevate and that helps.  only happens every once in a while.    eyelid swelling- has happened several  times before.  drains and swells.     Habits & Providers  Alcohol-Tobacco-Diet     Alcohol drinks/day: 0     Tobacco Status: current     Tobacco Counseling: to quit use of tobacco products     Cigarette Packs/Day: 1.0     Diet Comments: not watching his diet     Diet Counseling: not indicated; diet is assessed to be healthy  Allergies: No Known Drug Allergies  Review of Systems  The patient denies anorexia, fever, and weight loss.    Physical Exam  General:  vs reviewedalert, well-developed, well-nourished, and well-hydrated.  alert, well-developed, well-nourished, and well-hydrated.   Head:  normocephalic and atraumatic.  normocephalic and atraumatic.   Eyes:   hordeolum.   Ears:  External ear exam shows no significant lesions or deformities.   Nose:  no external deformity.   Mouth:  fair dentition. Lungs:  Normal respiratory effort, chest expands symmetrically. Lungs are clear to auscultation, no crackles or wheezes. Heart:  Normal rate and regular rhythm. S1 and S2  normal without gallop, murmur, click, rub or other extra sounds. Abdomen:  Bowel sounds positive,abdomen soft and non-tender without masses, organomegaly or hernias noted. Extremities:  no edema Skin:  color normal, no rashes, and no edema.     Impression & Recommendations:  Problem # 1:  DIAB W/OPHTH MANIFESTS TYPE II/UNS NOT UNCNTRL (ICD-250.50) Assessment Deteriorated A1c slightly worse 7.3.  pt states usually worse in the fall winter and improves with spring.  continue curent meds.    His updated medication list for this problem includes:    Lantus 100 Unit/ml Soln (Insulin glargine) .[name]... Take 50 units in am and 100 units in pm. dispense 1 month supply as well as needles and syringes.    Accupril 5 Mg Tabs (Quinapril hcl) .[name]... Take 1/2 tablet daily for bp    Baby Aspirin 81 Mg Chew (Aspirin) .[name]... Take one tablet by mouth daily    Metformin Hcl 1000 Mg Tabs (Metformin hcl) .[name]... Take one twice a day  Orders: A1C-[facility] (63875) [facility]- Est  Level 4 (64332)  Problem # 2:  ESSENTIAL HYPERTENSION, BENIGN (ICD-401.1) Assessment: Unchanged at goal.  continue current treatmetn His updated medication list for this problem includes:    Accupril 5 Mg Tabs (Quinapril  hcl) ..... Take 1/2 tablet daily for bp    Furosemide 20 Mg Tabs (Furosemide) .[name]... Take one as needed for leg swelling.  may take up to 2 per day.  Orders: [facility]- Est  Level 4 ([address])  Problem # 3:  LEG EDEMA (ICD-782.3) Assessment: Unchanged better with lasix, getting more frequent.  likely due to venous insufficiency but would like to check cardiac function.  will get echo.   His updated medication list for this problem includes:    Furosemide 20 Mg Tabs (Furosemide) .[name]... Take one as needed for leg swelling.  may take up to 2 per day.  Orders: [facility]- Est  Level 4 (99214) 2 D Echo (2 D Echo)  Complete Medication List: 1)  Lantus 100 Unit/ml Soln (Insulin glargine) .... Take 50 units in am and 100 units in pm. dispense 1  month supply as well as needles and syringes. 2)  Truetrack Test Strp (Glucose blood) .... Use 2 times a day 3)  Accupril 5 Mg Tabs (Quinapril hcl) .... Take 1/2 tablet daily for bp 4)  Baby Aspirin 81 Mg Chew (Aspirin) .... Take one tablet by mouth daily 5)  Viagra 100 Mg Tabs (Sildenafil citrate) .... Take 1 tablet by mouth one hour before sexual relations 6)  Furosemide 20 Mg Tabs (Furosemide) .... Take one as needed for leg swelling.  may take up to 2 per day. 7)  Metformin Hcl 1000 Mg Tabs (Metformin hcl) .... Take one twice a day  Patient Instructions: 1)  I am going to send you to the heart doctor to have them make sure your heart is pumping well. 2)  Keep up the good work with the diabetes. 3)  Apply warm compresses to your eye 3 times a day.  If it isn't better in 1-2 weeks, we can send you back to the opthamologist. Prescriptions: LANTUS 100 UNIT/ML SOLN (INSULIN GLARGINE) Take 50 units in AM and 100 units in PM. Dispense 1 month supply as well as needles and syringes.  #1 x 6   Entered and Authorized by:   Ellery Plunk MD   Signed by:   Ellery Plunk MD on 04/25/2010   Method used:   Electronically to        [facility] [address] [phone]* (retail)       [address]       [place], [place]  [address]       Ph: [phone] or [phone]       Fax: [phone]   RxID:   [id number] TEST   STRP (GLUCOSE BLOOD) use 2 times a day  #100 x 4   Entered and Authorized by:   Ellery Plunk MD   Signed by:   Ellery Plunk MD on 04/25/2010   Method used:   [facility]ron[facility] to        [facility] [address] [phone]* (retail)       [address]       [place], [place]  [address]       Ph: [phone] or [phone]       Fax: [phone]   RxID:   [id number] ACCUPRIL 5 MG TABS (QUINAPRIL HCL) Take 1/2 tablet daily for BP  #30 x 6   Entered and Authorized by:   Ellery Plunk MD   Signed by:   Ellery Plunk MD on  04/25/2010   Method used:  [facility]ron[facility] to        [facility] [address] [phone]* (retail)       [address]       [place], [place]  [address]       Ph: [phone] or [phone]       Fax: [phone]   RxID:   [id number] FUROSEMIDE 20 MG TABS (FUROSEMIDE) take one as needed for leg swelling.  May take up to 2 per day.  #60 x 3   Entered and Authorized by:   Ellery Plunk MD   Signed by:   Ellery Plunk MD on 04/25/2010   Method used:   [facility]ronically to        [facility] [address] [phone]* (retail)       [address]       [place], [place]  [address]       Ph: [phone] or [phone]       Fax: [phone]   RxID:   [id number] METFORMIN HCL 1000 MG TABS (METFORMIN HCL) take one twice a day  #60 x 6   Entered and Authorized by:   Ellery Plunk MD   Signed by:   Ellery Plunk MD on 04/25/2010   Method used:   [facility]ronically to        [facility] [address] [phone]* (retail)       [address]       [place], [place]  [address]       Ph: [phone] or [phone]       Fax: [phone]   RxID:   [id number]    Orders Added: 1)  A1C-[facility] [83036] 2)  [facility]- Est  Level 4 [26834] 3)  2 D Echo [2 D Echo]    Laboratory Results   Blood Tests   Date/Time Received: April 25, 2010 3:12 PM  Date/Time Reported: April 25, 2010 3:55 PM   HGBA1C: 7.3%   (Normal Range: Non-Diabetic - 3-6%   Control Diabetic - 6-8%)  Comments: ...............test performed by......[name] KitchenBonnie A. [place], MLS (ASCP)cm     Prevention & Chronic Care Immunizations   Influenza vaccine: Fluvax MCR  (04/28/2009)   Influenza vaccine deferral: Not indicated  (04/25/2010)   Influenza vaccine due: 03/02/2011    Tetanus booster: 08/23/2008: given   Tetanus booster due: 08/23/2018    Pneumococcal vaccine: Pneumovax (Medicare)  (06/01/2009)  Colorectal Screening   Hemoccult: Not documented   Hemoccult due:  Not Indicated    Colonoscopy: Hyperplastic Polyp  (11/03/2006)   Colonoscopy due: 11/02/2016  Other Screening   PSA: 0.12  (10/07/2006)   PSA action/deferral: Not indicated  (03/17/2009)   PSA due due: 10/07/2007   Smoking status: current  (04/25/2010)   Smoking cessation counseling: yes  (12/14/2009)  Diabetes Mellitus   HgbA1C: 7.3  (04/25/2010)   HgbA1C action/deferral: Ordered  (06/01/2009)   Hemoglobin A1C due: 09/29/2007    Eye exam: diabetic retinopathy  (12/13/2009)   Diabetic eye exam action/deferral: Ophthalmology referral  (09/12/2009)   Eye exam due: 11/2010    Foot exam: yes  (09/12/2009)   Foot exam action/deferral: Not indicated   High risk foot: Not documented   Foot care education: Not documented   Foot exam due: 03/16/2010    Urine microalbumin/creatinine ratio: Not documented   Urine microalbumin action/deferral: Ordered   Urine microalbumin/cr due: 06/30/2008    Diabetes  flowsheet reviewed?: Yes   Progress toward A1C goal: Deteriorated    Stage of readiness to change (diabetes management): Action  Lipids   Total Cholesterol: 170  (12/15/2009)   LDL: 96  (12/15/2009)   LDL Direct: 72  (07/21/2008)   HDL: 39  (12/15/2009)   Triglycerides: 174  (12/15/2009)   Lipid panel due: 12/16/2010  Hypertension   Last Blood Pressure: 109 / 70  (04/25/2010)   Serum creatinine: 1.18  (10/31/2009)   Serum potassium 4.4  (10/31/2009)    Hypertension flowsheet reviewed?: Yes   Progress toward BP goal: At goal    Stage of readiness to change (hypertension management): Preparation  Self-Management Support :   Personal Goals (by the next clinic visit) :     Personal A1C goal: 7  (03/17/2009)     Personal blood pressure goal: 130/80  (09/12/2009)     Personal LDL goal: 70  (03/17/2009)     Home glucose monitoring frequency: 2 times a day  (09/12/2009)    Diabetes self-management support: Written self-care plan  (04/25/2010)   Diabetes care plan printed     Diabetes self-management support not done because: Good outcomes  (12/14/2009)    Hypertension self-management support: Written self-care plan  (04/25/2010)   Hypertension self-care plan printed.    Hypertension self-management support not done because: Good outcomes  (12/14/2009)

## 2010-07-31 NOTE — Letter (Signed)
Summary: [facility]  [address]   [place], [place] [address]   Phone: [phone]  Fax: [phone]    04/03/2010 MRN: [id number]  Dear Mr. BOUTELLE,   You have missed three scheduled appointments with the [facility] since JAnuary of this year.  It is expected that if you are unable to keep your appointments that you will call to cancel at least 24 hours prior to your appointment time.  In order for [place] to provide you with the best possible care, we urge you to keep your appointments.  If you continue to miss appointments, however, we will ask that you find another healthcare provider.  Our business office staff can be reached at [phone] Monday through Friday from 8:30 a.m.-5:00 p.m. and will be glad to reschedule your appointment as necessary.  Thank you for your cooperation.  Sincerely,  [facility]

## 2010-07-31 NOTE — Assessment & Plan Note (Signed)
Summary: Adam Ray   Vital Signs:  Patient profile:   55 year old male Height:      67.5 inches Weight:      159 pounds BMI:     24.62 BSA:     1.85 Temp:     98.3 degrees F Pulse rate:   71 / minute BP sitting:   108 / 64  Vitals Entered By: Jone Baseman CMA (Oct 31, 2009 3:15 PM) CC: f/u Is Patient Diabetic? No Pain Assessment Patient in pain? no        Primary Care Provider:  Ellery Plunk MD  CC:  f/u.  History of Present Illness: pt with no complaints taking 1000 metformin q day and accupril daily CBG at night 150-180. did not see opthomologist because is established with another doctor.    went to podiatrist who recommended that he follow up at foot center  Habits & Providers  Alcohol-Tobacco-Diet     Tobacco Status: current     Tobacco Counseling: to quit use of tobacco products     Cigarette Packs/Day: 0.25  Current Medications (verified): 1)  Lantus 100 Unit/ml Soln (Insulin Glargine) .... Take 50 Units in Am and 50 Units in Pm. Dispense 1 Month Supply As Well As Needles and Syringes. 2)  Truetrack Test   Strp (Glucose Blood) .... Use 2 Times A Day 3)  Accupril 5 Mg Tabs (Quinapril Hcl) .... Take 1/2 Tablet Daily For Bp 4)  Baby Aspirin 81 Mg  Chew (Aspirin) .... Take One Tablet By Mouth Daily 5)  Viagra 100 Mg Tabs (Sildenafil Citrate) .... Take 1 Tablet By Mouth One Hour Before Sexual Relations 6)  Furosemide 20 Mg Tabs (Furosemide) .... Take One As Needed For Leg Swelling.  May Take Up To 2 Per Day. 7)  Metformin Hcl 500 Mg Tabs (Metformin Hcl) .... Take One Daily For 7 Days, Then Twice A Day For 7 Days, Then Two Pills Twice A Day Until The End of The Month. 8)  Metformin Hcl 1000 Mg Tabs (Metformin Hcl) .... Take One Twice A Day  Allergies (verified): No Known Drug Allergies  Social History: Packs/Day:  0.25  Review of Systems  The patient denies anorexia, fever, chest pain, syncope, and abdominal pain.    Physical Exam  General:   Well-developed,well-nourished,in no acute distress; alert,appropriate and cooperative throughout examination Lungs:  Normal respiratory effort, chest expands symmetrically. Lungs are clear to auscultation, no crackles or wheezes. Heart:  Normal rate and regular rhythm. S1 and S2 normal without gallop, murmur, click, rub or other extra sounds. Extremities:  trace edema   Impression & Recommendations:  Problem # 1:  DIABETES MELLITUS, TYPE II (ICD-250.00) pt's nighttime CBG are improved.  Pt does not want to get am CBGs.  Asked pt to increase metformin to two times a day.  recheck A1C in 1.5 months. His updated medication list for this problem includes:    Lantus 100 Unit/ml Soln (Insulin glargine) .[name]... Take 50 units in am and 50 units in pm. dispense 1 month supply as well as needles and syringes.    Accupril 5 Mg Tabs (Quinapril hcl) .[name]... Take 1/2 tablet daily for bp    Baby Aspirin 81 Mg Chew (Aspirin) .[name]... Take one tablet by mouth daily    Metformin Hcl 500 Mg Tabs (Metformin hcl) .[name]... Take one daily for 7 days, then twice a day for 7 days, then two pills twice a day until the end of the month.  Metformin Hcl 1000 Mg Tabs (Metformin hcl) .[name]... Take one twice a day  Orders: Basic Met-[facility] ([address]) [facility]- Est Level  3 (564[address])  Problem # 2:  ESSENTIAL HYPERTENSION, BENIGN (ICD-401.1) pt now taking accupril.  checked BMET for Cr.  about a 10% increase from Jan 2010.  COntinue medicine.  BP at goal His updated medication list for this problem includes:    Accupril 5 Mg Tabs (Quinapril hcl) .[name]... Take 1/2 tablet daily for bp    Furosemide 20 Mg Tabs (Furosemide) .[name]... Take one as needed for leg swelling.  may take up to 2 per day.  Orders: [facility]- Est Level  3 (29518)  Complete Medication List: 1)  Lantus 100 Unit/ml Soln (Insulin glargine) .... Take 50 units in am and 50 units in pm. dispense 1 month supply as well as needles and syringes. 2)  Truetrack Test Strp (Glucose blood)  .... Use 2 times a day 3)  Accupril 5 Mg Tabs (Quinapril hcl) .... Take 1/2 tablet daily for bp 4)  Baby Aspirin 81 Mg Chew (Aspirin) .... Take one tablet by mouth daily 5)  Viagra 100 Mg Tabs (Sildenafil citrate) .... Take 1 tablet by mouth one hour before sexual relations 6)  Furosemide 20 Mg Tabs (Furosemide) .... Take one as needed for leg swelling.  may take up to 2 per day. 7)  Metformin Hcl 500 Mg Tabs (Metformin hcl) .... Take one daily for 7 days, then twice a day for 7 days, then two pills twice a day until the end of the month. 8)  Metformin Hcl 1000 Mg Tabs (Metformin hcl) .... Take one twice a day  Patient Instructions: 1)  please return in 1.5 months around june 15th  Prevention & Chronic Care Immunizations   Influenza vaccine: Fluvax MCR  (04/28/2009)   Influenza vaccine deferral: Not available  (03/17/2009)   Influenza vaccine due: 06/01/2009    Tetanus booster: 08/23/2008: given   Tetanus booster due: 08/23/2018    Pneumococcal vaccine: Pneumovax (Medicare)  (06/01/2009)  Colorectal Screening   Hemoccult: Not documented   Hemoccult due: Not Indicated    Colonoscopy: Hyperplastic Polyp  (11/03/2006)   Colonoscopy due: 11/02/2016  Other Screening   PSA: 0.12  (10/07/2006)   PSA action/deferral: Not indicated  (03/17/2009)   PSA due due: 10/07/2007   Smoking status: current  (10/31/2009)   Smoking cessation counseling: YES  (06/01/2009)  Diabetes Mellitus   HgbA1C: 8.8  (09/12/2009)   HgbA1C action/deferral: Ordered  (06/01/2009)   Hemoglobin A1C due: 09/29/2007    Eye exam: Not documented   Diabetic eye exam action/deferral: Ophthalmology referral  (09/12/2009)    Foot exam: yes  (09/12/2009)   High risk foot: Not documented   Foot care education: Not documented    Urine microalbumin/creatinine ratio: Not documented   Urine microalbumin action/deferral: Ordered   Urine microalbumin/cr due: 06/30/2008    Diabetes flowsheet reviewed?: Yes   Progress  toward A1C goal: Improved    Stage of readiness to change (diabetes management): Action  Lipids   Total Cholesterol: 140  (11/09/2008)   LDL: 75  (11/09/2008)   LDL Direct: 72  (07/21/2008)   HDL: 38  (11/09/2008)   Triglycerides: 137  (11/09/2008)   Lipid panel due: 11/30/2009  Hypertension   Last Blood Pressure: 108 / 64  (10/31/2009)   Serum creatinine: 1.08  (07/21/2008)   Serum potassium 4.5  (07/21/2008)    Hypertension flowsheet reviewed?: Yes   Progress toward BP goal: At goal  Stage of readiness to change (hypertension management): Action  Self-Management Support :   Personal Goals (by the next clinic visit) :     Personal A1C goal: 7  (03/17/2009)     Personal blood pressure goal: 130/80  (09/12/2009)     Personal LDL goal: 70  (03/17/2009)     Home glucose monitoring frequency: 2 times a day  (09/12/2009)    Diabetes self-management support: Written self-care plan  (10/31/2009)   Diabetes care plan printed    Hypertension self-management support: Written self-care plan  (10/31/2009)   Hypertension self-care plan printed.

## 2010-07-31 NOTE — Progress Notes (Signed)
  Phone Note Call from Patient   Caller: Patient Call For: [phone] Summary of Call: Want a STD test and see the physician Initial call taken by: Abundio Miu,  April 12, 2010 2:06 PM  Follow-up for Phone Call        LM Follow-up by: Golden Circle RN,  April 12, 2010 2:42 PM  Additional Follow-up for Phone Call Additional follow up Details #1::        appt made for tomorrow. understands his md will not be here. he wants blood testing for stds as well Additional Follow-up by: Golden Circle RN,  April 12, 2010 3:02 PM

## 2010-07-31 NOTE — Miscellaneous (Signed)
Summary: DM supplies  Clinical Lists Changes rec'd form from [facility]. completed what I could & to pcp to finish & fax.Golden Circle RN  August 08, 2009 11:26 AM finised and put in to be faxed box  Ellery Plunk MD  August 11, 2009 3:54 PM

## 2010-07-31 NOTE — Miscellaneous (Signed)
  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

## 2010-07-31 NOTE — Assessment & Plan Note (Signed)
Summary: f/u dm,df   Vital Signs:  Patient profile:   55 year old male Height:      67.5 inches Weight:      163.5 pounds BMI:     25.32 Temp:     98.3 degrees F oral Pulse rate:   57 / minute BP sitting:   134 / 80  (left arm) Cuff size:   regular  Vitals Entered By: Garen Grams LPN (September 12, 2009 3:59 PM) CC: f/u dm Is Patient Diabetic? Yes Did you bring your meter with you today? No Pain Assessment Patient in pain? no        Primary Care Provider:  Ellery Plunk MD  CC:  f/u dm.  History of Present Illness: pt here for DM f/u  pt taking his lantus and checking his CBGs at night.  CBGs have been 130-150s.  Pt not taking metformin and only taking his ACE irregularly.  no changes in vision, no problems with feet- [facility] and missed last appt.  smoking-  considering quitting, has chantix at home  arm pain-  only at night when he lays on it.  no pain during day, no weakness, no focal findings.  Has tried 300mg  motrin with little effect.  Habits & Providers  Alcohol-Tobacco-Diet     Tobacco Status: current     Tobacco Counseling: to quit use of tobacco products     Cigarette Packs/Day: 0.5     Diet Comments: not watching his diet  Current Medications (verified): 1)  Lantus 100 Unit/ml Soln (Insulin Glargine) .... Take 50 Units in Am and 50 Units in Pm. Dispense 1 Month Supply As Well As Needles and Syringes. 2)  Truetrack Test   Strp (Glucose Blood) .... Use 2 Times A Day 3)  Accupril 5 Mg Tabs (Quinapril Hcl) .... Take 1/2 Tablet Daily For Bp 4)  Baby Aspirin 81 Mg  Chew (Aspirin) .... Take One Tablet By Mouth Daily 5)  Viagra 100 Mg Tabs (Sildenafil Citrate) .... Take 1 Tablet By Mouth One Hour Before Sexual Relations 6)  Furosemide 20 Mg Tabs (Furosemide) .... Take One As Needed For Leg Swelling.  May Take Up To 2 Per Day. 7)  Metformin Hcl 500 Mg Tabs (Metformin Hcl) .... Take One Daily For 7 Days, Then Twice A Day For 7 Days,  Then Two Pills Twice A Day Until The End of The Month. 8)  Metformin Hcl 1000 Mg Tabs (Metformin Hcl) .... Take One Twice A Day  Allergies (verified): No Known Drug Allergies  Review of Systems  The patient denies anorexia, weight loss, vision loss, chest pain, syncope, headaches, abdominal pain, and difficulty walking.    Physical Exam  General:  cooperative to examination and unkempt.   Head:  normocephalic, atraumatic, and male-pattern balding.   Lungs:  Normal respiratory effort, chest expands symmetrically. Lungs are clear to auscultation, no crackles or wheezes. Heart:  Normal rate and regular rhythm. S1 and S2 normal without gallop, murmur, click, rub or other extra sounds. Abdomen:  Bowel sounds positive,abdomen soft and non-tender without masses, organomegaly or hernias noted. Msk:  Left arm full ROM, no point tenderness, no weakness  Diabetes Management Exam:    Foot Exam (with socks and/or shoes not present):       Sensory-Pinprick/Light touch:          Left medial foot (L-4): normal          Left dorsal foot (L-5): diminished  Left lateral foot (S-1): normal          Right medial foot (L-4): normal          Right dorsal foot (L-5): diminished          Right lateral foot (S-1): normal       Inspection:          Left foot: normal          Right foot: abnormal             Comments: callous present oover forefoot- pt states has been seen and evaluated by foot center    Foot Exam by Podiatrist:       Results: no diabetic findings   Impression & Recommendations:  Problem # 1:  DIABETES MELLITUS, TYPE II (ICD-250.00) Assessment Deteriorated A1C is up today.  pt is not taking metformin.  restarted this and asked pt to take CBG in am and bring in at next visit.  see back in one month.  His updated medication list for this problem includes:    Lantus 100 Unit/ml Soln (Insulin glargine) .[name]... Take 50 units in am and 50 units in pm. dispense 1 month supply as well  as needles and syringes.    Accupril 5 Mg Tabs (Quinapril hcl) .[name]... Take 1/2 tablet daily for bp    Baby Aspirin 81 Mg Chew (Aspirin) .[name]... Take one tablet by mouth daily    Metformin Hcl 500 Mg Tabs (Metformin hcl) .[name]... Take one daily for 7 days, then twice a day for 7 days, then two pills twice a day until the end of the month.    Metformin Hcl 1000 Mg Tabs (Metformin hcl) .[name]... Take one twice a day  Orders: A1C-[facility] (98119) Ophthalmology Referral (Ophthalmology) [facility]- Est  Level 4 (14782)  Problem # 2:  ESSENTIAL HYPERTENSION, BENIGN (ICD-401.1) Assessment: Unchanged Pt is not taking accupril regularly.  explained that this is for his kidneys.  will check a BMET next visit. BP goal is 130/80, pt at goal His updated medication list for this problem includes:    Accupril 5 Mg Tabs (Quinapril hcl) .[name]... Take 1/2 tablet daily for bp    Furosemide 20 Mg Tabs (Furosemide) .[name]... Take one as needed for leg swelling.  may take up to 2 per day.  Orders: [facility]- Est  Level 4 (95621)  Problem # 3:  TOBACCO ABUSE (ICD-305.1) Assessment: Unchanged Pt thinking of quitting smoking and has chantix.  talked to him about the smoking cessation class.  will continue to reassess readiness for change. Orders: [facility]- Est  Level 4 (99214)  Complete Medication List: 1)  Lantus 100 Unit/ml Soln (Insulin glargine) .... Take 50 units in am and 50 units in pm. dispense 1 month supply as well as needles and syringes. 2)  Truetrack Test Strp (Glucose blood) .... Use 2 times a day 3)  Accupril 5 Mg Tabs (Quinapril hcl) .... Take 1/2 tablet daily for bp 4)  Baby Aspirin 81 Mg Chew (Aspirin) .... Take one tablet by mouth daily 5)  Viagra 100 Mg Tabs (Sildenafil citrate) .... Take 1 tablet by mouth one hour before sexual relations 6)  Furosemide 20 Mg Tabs (Furosemide) .... Take one as needed for leg swelling.  may take up to 2 per day. 7)  Metformin Hcl 500 Mg Tabs (Metformin hcl) .... Take one daily for 7 days, then  twice a day for 7 days, then two pills twice a day until the end of the month.  8)  Metformin Hcl 1000 Mg Tabs (Metformin hcl) .... Take one twice a day  Patient Instructions: 1)  Please schedule a follow-up appointment in 1 month.  2)  at that time we will check your electrolytes 3)  bring all the medications that you are taking 4)  check your blood sugar in the morning Prescriptions: METFORMIN HCL 1000 MG TABS (METFORMIN HCL) take one twice a day  #60 x 3   Entered and Authorized by:   Ellery Plunk MD   Signed by:   Ellery Plunk MD on 09/12/2009   Method used:   Electronically to        [facility] [address] [phone]* (retail)       [address]       [place], [place]  [address]       Ph: [phone] or [phone]       Fax: [phone]   RxID:   [id number] ACCUPRIL 5 MG TABS (QUINAPRIL HCL) Take 1/2 tablet daily for BP  #30 x 3   Entered and Authorized by:   Ellery Plunk MD   Signed by:   Ellery Plunk MD on 09/12/2009   Method used:   [facility]ronically to        [facility] [address] [phone]* (retail)       [address]       [place], [place]  [address]       Ph: [phone] or [phone]       Fax: [phone]   RxID:   [id number] LANTUS 100 UNIT/ML SOLN (INSULIN GLARGINE) Take 50 units in AM and 50 units in PM. Dispense 1 month supply as well as needles and syringes.  #3 x 3   Entered and Authorized by:   Ellery Plunk MD   Signed by:   Ellery Plunk MD on 09/12/2009   Method used:   [facility]        [facility] [address] [phone]* (retail)       [address]       [place], [place]  [address]       Ph: [phone] or [phone]       Fax: [phone]   RxID:   [id number]   Laboratory Results   Blood Tests   Date/Time Received: September 12, 2009 3:15  PM  Date/Time Reported: September 12, 2009 4:07 PM   HGBA1C: 8.8%   (Normal Range: Non-Diabetic - 3-6%   Control  Diabetic - 6-8%)  Comments: ...........test performed by..........[name] KitchenGaren Grams, LPN entered by Terese Door, CMA          Prevention & Chronic Care Immunizations   Influenza vaccine: Fluvax MCR  (04/28/2009)   Influenza vaccine deferral: Not available  (03/17/2009)   Influenza vaccine due: 06/01/2009    Tetanus booster: 08/23/2008: given   Tetanus booster due: 08/23/2018    Pneumococcal vaccine: Pneumovax (Medicare)  (06/01/2009)  Colorectal Screening   Hemoccult: Not documented   Hemoccult due: Not Indicated    Colonoscopy: Hyperplastic Polyp  (11/03/2006)   Colonoscopy due: 11/02/2016  Other Screening   PSA: 0.12  (10/07/2006)   PSA action/deferral: Not indicated  (03/17/2009)   PSA due due: 10/07/2007    Smoking status: current  (09/12/2009)   Smoking cessation counseling: YES  (06/01/2009)  Diabetes Mellitus   HgbA1C: 8.8  (09/12/2009)   HgbA1C action/deferral:  Ordered  (06/01/2009)   Hemoglobin A1C due: 09/29/2007    Eye exam: Not documented   Diabetic eye exam action/deferral: Ophthalmology referral  (09/12/2009)    Foot exam: yes  (09/12/2009)   High risk foot: Not documented   Foot care education: Not documented    Urine microalbumin/creatinine ratio: Not documented   Urine microalbumin action/deferral: Ordered   Urine microalbumin/cr due: 06/30/2008    Diabetes flowsheet reviewed?: Yes   Progress toward A1C goal: Deteriorated  Hypertension   Last Blood Pressure: 134 / 80  (09/12/2009)   Serum creatinine: 1.08  (07/21/2008)   Serum potassium 4.5  (07/21/2008)    Hypertension flowsheet reviewed?: Yes   Progress toward BP goal: At goal  Lipids   Total Cholesterol: 140  (11/09/2008)   LDL: 75  (11/09/2008)   LDL Direct: 72  (07/21/2008)   HDL: 38  (11/09/2008)   Triglycerides: 137  (11/09/2008)   Lipid panel due: 11/30/2009  Self-Management Support :   Personal Goals (by the next clinic visit) :     Personal A1C goal: 7  (03/17/2009)      Personal blood pressure goal: 130/80  (09/12/2009)     Personal LDL goal: 70  (03/17/2009)    Patient will work on the following items until the next clinic visit to reach self-care goals:     Medications and monitoring: check my blood sugar  (09/12/2009)     Other: attend a smoking cessation class  (09/12/2009)     Home glucose monitoring frequency: 2 times a day  (09/12/2009)    Diabetes self-management support: Copy of home glucose meter record, Written self-care plan  (09/12/2009)   Diabetes care plan printed    Hypertension self-management support: Written self-care plan  (09/12/2009)   Hypertension self-care plan printed.   Nursing Instructions: Refer for screening diabetic eye exam (see order)

## 2010-07-31 NOTE — Assessment & Plan Note (Signed)
Summary: std check/Malone/spiegel   Vital Signs:  Patient profile:   55 year old male Height:      67.5 inches Weight:      168.4 pounds BMI:     26.08 Temp:     98.7 degrees F oral Pulse rate:   76 / minute BP sitting:   111 / 73  (left arm) Cuff size:   large  Vitals Entered By: Jimmy Footman, CMA (April 13, 2010 2:58 PM) CC: std check  Is Patient Diabetic? No Pain Assessment Patient in pain? no        Primary Provider:  Ellery Plunk MD  CC:  std check .  History of Present Illness: Patient comes in today to be checked for std.  States he has not had any symptoms however his wife has been complaining of itching and irritation in the vaginal area with some discharge, especially after intercourse. She has not been tested for STD's.  Patient does use latex condoms most times with intercourse, wife thinks she may be allergic to latex.  Pt. denies urethral discharge, rash, fever, dysuria, frequency or testicular pain.  Habits & Providers  Alcohol-Tobacco-Diet     Tobacco Status: current     Cigarette Packs/Day: 1.0  Current Medications (verified): 1)  Lantus 100 Unit/ml Soln (Insulin Glargine) .... Take 50 Units in Am and 100 Units in Pm. Dispense 1 Month Supply As Well As Needles and Syringes. 2)  Truetrack Test   Strp (Glucose Blood) .... Use 2 Times A Day 3)  Accupril 5 Mg Tabs (Quinapril Hcl) .... Take 1/2 Tablet Daily For Bp 4)  Baby Aspirin 81 Mg  Chew (Aspirin) .... Take One Tablet By Mouth Daily 5)  Viagra 100 Mg Tabs (Sildenafil Citrate) .... Take 1 Tablet By Mouth One Hour Before Sexual Relations 6)  Furosemide 20 Mg Tabs (Furosemide) .... Take One As Needed For Leg Swelling.  May Take Up To 2 Per Day. 7)  Metformin Hcl 1000 Mg Tabs (Metformin Hcl) .... Take One Twice A Day  Allergies (verified): No Known Drug Allergies  Social History: Packs/Day:  1.0  Review of Systems       Pertinent positives and negatives noted in HPI, Vitals signs noted   Physical  Exam  General:  Well-developed,well-nourished,in no acute distress; alert,appropriate and cooperative throughout examination Lungs:  Normal respiratory effort, chest expands symmetrically. Lungs are clear to auscultation, no crackles or wheezes. Heart:  Normal rate and regular rhythm. S1 and S2 normal without gallop, murmur, click, rub or other extra sounds. Abdomen:  Bowel sounds positive,abdomen soft and non-tender without masses, organomegaly or hernias noted. Genitalia:  Testes bilaterally descended without tenderness.  No urethral discharge.  There is a moderate size hydrocele on the L testicle Msk:  no joint tenderness, no joint swelling, no joint warmth, no redness over joints, and no crepitation.   Skin:  Intact without suspicious lesions or rashes Inguinal Nodes:  No significant adenopathy   Impression & Recommendations:  Problem # 1:  SEXUALLY TRANSMITTED DISEASE, EXPOSURE TO (ICD-V01.6) Patient thinks wife may have STD, she has not been screened.  Will go ahead and check patient for most common stds, with HIV, RPR, and first void urine for GC and chlamydia.  Advised if tests were negative that his wife should get her self checked if symptoms persist and that they should discontinue latex condoms and try an alternative such as polyurethane. Orders: RPR-[facility] ([id number]) [facility]- Est Level  3 (01027)  Complete Medication List: 1)  Lantus 100 Unit/ml Soln (Insulin glargine) .... Take 50 units in am and 100 units in pm. dispense 1 month supply as well as needles and syringes. 2)  Truetrack Test Strp (Glucose blood) .... Use 2 times a day 3)  Accupril 5 Mg Tabs (Quinapril hcl) .... Take 1/2 tablet daily for bp 4)  Baby Aspirin 81 Mg Chew (Aspirin) .... Take one tablet by mouth daily 5)  Viagra 100 Mg Tabs (Sildenafil citrate) .... Take 1 tablet by mouth one hour before sexual relations 6)  Furosemide 20 Mg Tabs (Furosemide) .... Take one as needed for leg swelling.  may take up to 2 per  day. 7)  Metformin Hcl 1000 Mg Tabs (Metformin hcl) .... Take one twice a day  Other Orders: GC/Chlamydia-[facility] ([id number]) Urinalysis-[facility] (00000) HIV-[facility] ([id number])  Patient Instructions: 1)  Nice meeting you today 2)  I will let you know the results of your testing when they come in. 3)  I would encourage your wife to have testing done for other causes and testing to see if she possibly has a latex allergy.   4)  You can try switching types of condoms, you can try polyurethane condoms to see if that helps.  5)  If you have further questions please call the clinic  Laboratory Results   Urine Tests  Date/Time Received: April 13, 2010 3:05 PM  Date/Time Reported: April 13, 2010 3:15 PM   Routine Urinalysis   Color: yellow Appearance: Clear Glucose: negative   (Normal Range: Negative) Bilirubin: negative   (Normal Range: Negative) Ketone: negative   (Normal Range: Negative) Spec. Gravity: 1.020   (Normal Range: 1.003-1.035) Blood: negative   (Normal Range: Negative) pH: 8.0   (Normal Range: 5.0-8.0) Protein: negative   (Normal Range: Negative) Urobilinogen: 1.0   (Normal Range: 0-1) Nitrite: negative   (Normal Range: Negative) Leukocyte Esterace: negative   (Normal Range: Negative)    Comments: ...........test performed by...........[name] KitchenTerese Door, CMA

## 2010-07-31 NOTE — Letter (Signed)
Summary: Generic Letter  [facility]  [address]   [place], [place] [address]   Phone: [phone]  Fax: [phone]    09/14/2009  Adam Ray [address] [place], [place]  [address]  Dear [name],      I was unable to contact you by phone.  Dr. Hulen Luster requested we schedule an appointment for you with an ophthalmologist for a diabetic eye exam. An appointment has been scheduled for  March 29 ,2011 at 9:45 AM with Dr. Pearlean Brownie of [facility]. The address is [address]  and phone number is [phone]. If this time is not convenient please call their office to reschedule.        Thank you.       Sincerely,   Theresia Lo RN

## 2010-07-31 NOTE — Miscellaneous (Signed)
Summary: diabetic shoe request  Clinical Lists Changes forms from [employer] to pcp for him to get diabetic shoes. forms to pcp to complete & sign.Golden Circle RN  September 05, 2009 3:49 PM  filled out, returned to sally's desk Ellery Plunk MD  September 12, 2009 4:37 PM

## 2010-07-31 NOTE — Letter (Signed)
Summary: Lipid Letter  [facility]  [address]   [place], [place] [address]   Phone: [phone]  Fax: [phone]    12/18/2009  Bao Bazen [address] [place], [place]  [address]  Dear Jillyn Hidden: We have received your lab results. Your STD check was negative.  We checked for Gonnorhea, Chlamydia, HIV, and syphilis.  We have carefully reviewed your last lipid profile and the results are noted below with a summary of recommendations for lipid management.    Cholesterol:       170     Goal: < 200   HDL "good" Cholesterol:   38     Goal: >39   LDL "bad" Cholesterol:   96     Goal: <100   Triglycerides:       174     Goal: <150        TLC Diet (Therapeutic Lifestyle Change): Saturated Fats & Transfatty acids should be kept < 7% of total calories ***Reduce Saturated Fats Polyunstaurated Fat can be up to 10% of total calories Monounsaturated Fat Fat can be up to 20% of total calories Total Fat should be no greater than 25-35% of total calories Carbohydrates should be 50-60% of total calories Protein should be approximately 15% of total calories Fiber should be at least 20-30 grams a day ***Increased fiber may help lower LDL Total Cholesterol should be < 200mg /day Consider adding plant stanol/sterols to diet (example: Benacol spread) ***A higher intake of unsaturated fat may reduce Triglycerides and Increase HDL    Adjunctive Measures (may lower LIPIDS and reduce risk of Heart Attack) include: Aerobic Exercise (20-30 minutes 3-4 times a week) Limit Alcohol Consumption Weight Reduction Aspirin 75-81 mg a day by mouth (if not allergic or contraindicated) Dietary Fiber 20-30 grams a day by mouth     Current Medications: 1)    Lantus 100 Unit/ml Soln (Insulin glargine) .... Take 50 units in am and 100 units in pm. dispense 1 month supply as well as needles and syringes. 2)    Truetrack Test   Strp (Glucose blood) .... Use 2 times a day 3)    Accupril 5 Mg  Tabs (Quinapril hcl) .... Take 1/2 tablet daily for bp 4)    Baby Aspirin 81 Mg  Chew (Aspirin) .... Take one tablet by mouth daily 5)    Viagra 100 Mg Tabs (Sildenafil citrate) .... Take 1 tablet by mouth one hour before sexual relations 6)    Furosemide 20 Mg Tabs (Furosemide) .... Take one as needed for leg swelling.  may take up to 2 per day. 7)    Metformin Hcl 1000 Mg Tabs (Metformin hcl) .... Take one twice a day  If you have any questions, please call. We appreciate being able to work with you.   Sincerely,    [facility] Ellery Plunk MD  Appended Document: Lipid Letter mailed

## 2010-07-31 NOTE — Assessment & Plan Note (Signed)
Summary: F/U/KH   Vital Signs:  Patient profile:   55 year old male Height:      67.5 inches Weight:      157 pounds BMI:     24.31 Temp:     98.2 degrees F oral Pulse rate:   81 / minute BP sitting:   112 / 74  (left arm) Cuff size:   regular  Vitals Entered By: Tessie Fass CMA (December 14, 2009 4:02 PM) CC: F/U diabetes Is Patient Diabetic? Yes Pain Assessment Patient in pain? no        Primary Care Provider:  Ellery Plunk MD  CC:  F/U diabetes.  History of Present Illness: DM- taking 50u lantus in each leg at bedtime and between 30-50units qam.  AM CBG between 140-150 except this am was 250, eating ice cream last night. taking metformin 1000 BID  went to optho yesterday, scheduled for an intervention when he has financial clearance.  smoking 8 cigarettes/day.  interesting in decreasing, does not want any meds or to go to classes  sex- says wife thinks he has an STD--interesting relationship between the two, neither will endorse mult sex partners or symptoms.  desires STD screen  Habits & Providers  Alcohol-Tobacco-Diet     Tobacco Status: current     Tobacco Counseling: to quit use of tobacco products     Cigarette Packs/Day: 0.5  Current Medications (verified): 1)  Lantus 100 Unit/ml Soln (Insulin Glargine) .... Take 50 Units in Am and 50 Units in Pm. Dispense 1 Month Supply As Well As Needles and Syringes. 2)  Truetrack Test   Strp (Glucose Blood) .... Use 2 Times A Day 3)  Accupril 5 Mg Tabs (Quinapril Hcl) .... Take 1/2 Tablet Daily For Bp 4)  Baby Aspirin 81 Mg  Chew (Aspirin) .... Take One Tablet By Mouth Daily 5)  Viagra 100 Mg Tabs (Sildenafil Citrate) .... Take 1 Tablet By Mouth One Hour Before Sexual Relations 6)  Furosemide 20 Mg Tabs (Furosemide) .... Take One As Needed For Leg Swelling.  May Take Up To 2 Per Day. 7)  Metformin Hcl 500 Mg Tabs (Metformin Hcl) .... Take One Daily For 7 Days, Then Twice A Day For 7 Days, Then Two Pills Twice A Day  Until The End of The Month. 8)  Metformin Hcl 1000 Mg Tabs (Metformin Hcl) .... Take One Twice A Day  Allergies (verified): No Known Drug Allergies  Social History: Packs/Day:  0.5  Review of Systems       complains of some leg swelling controlled by as needed lasix, otherwise negative  Physical Exam  General:  Well-developed,well-nourished,in no acute distress; alert,appropriate and cooperative throughout examination Lungs:  Normal respiratory effort, chest expands symmetrically. Lungs are clear to auscultation, no crackles or wheezes. Heart:  Normal rate and regular rhythm. S1 and S2 normal without gallop, murmur, click, rub or other extra sounds. Abdomen:  Bowel sounds positive,abdomen soft and non-tender without masses, organomegaly or hernias noted. Extremities:  some 1plus swelling in legs to calf  Diabetes Management Exam:    Eye Exam:       Eye Exam done elsewhere          Date: 12/13/2009          Results: diabetic retinopathy          Done by: eye center   Impression & Recommendations:  Problem # 1:  DIAB W/OPHTH MANIFESTS TYPE II/UNS NOT UNCNTRL (ICD-250.50) Assessment Improved A1C at goal.  will  ask pt to continue with current regimen  His updated medication list for this problem includes:    Lantus 100 Unit/ml Soln (Insulin glargine) .[name]... Take 50 units in am and 50 units in pm. dispense 1 month supply as well as needles and syringes.    Accupril 5 Mg Tabs (Quinapril hcl) .[name]... Take 1/2 tablet daily for bp    Baby Aspirin 81 Mg Chew (Aspirin) .[name]... Take one tablet by mouth daily    Metformin Hcl 500 Mg Tabs (Metformin hcl) .[name]... Take one daily for 7 days, then twice a day for 7 days, then two pills twice a day until the end of the month.    Metformin Hcl 1000 Mg Tabs (Metformin hcl) .[name]... Take one twice a day  Orders: [facility]- Est Level  3 (99213)Future Orders: Lipid-[facility] ([id number]) ... 12/06/2010  Problem # 2:  SEXUALLY TRANSMITTED DISEASE, EXPOSURE TO  (ICD-V01.6) Assessment: Unchanged will order future labs to eval for STD.  low risk per report Orders: [facility]- Est Level  3 (99213)Future Orders: GC/Chlamydia-[facility] ([id number]) ... 12/05/2010 HIV-[facility] ([id number]) ... 12/27/2010 RPR-[facility] [id number]) ... 12/12/2010  Complete Medication List: 1)  Lantus 100 Unit/ml Soln (Insulin glargine) .... Take 50 units in am and 50 units in pm. dispense 1 month supply as well as needles and syringes. 2)  Truetrack Test Strp (Glucose blood) .... Use 2 times a day 3)  Accupril 5 Mg Tabs (Quinapril hcl) .... Take 1/2 tablet daily for bp 4)  Baby Aspirin 81 Mg Chew (Aspirin) .... Take one tablet by mouth daily 5)  Viagra 100 Mg Tabs (Sildenafil citrate) .... Take 1 tablet by mouth one hour before sexual relations 6)  Furosemide 20 Mg Tabs (Furosemide) .... Take one as needed for leg swelling.  may take up to 2 per day. 7)  Metformin Hcl 500 Mg Tabs (Metformin hcl) .... Take one daily for 7 days, then twice a day for 7 days, then two pills twice a day until the end of the month. 8)  Metformin Hcl 1000 Mg Tabs (Metformin hcl) .... Take one twice a day  Other Orders: A1C-[facility] ([id number])  Patient Instructions: 1)  make an appt to come back for a cholesterol and sexually transmitted disease check.  Do not eat anything after midnight and only drink water or black coffee the morning before. 2)  Come see me in 3 months 3)  I will call if anything is worrisome, or else I will send a letter.  Laboratory Results   Blood Tests   Date/Time Received: December 14, 2009 4:38 PM  Date/Time Reported: December 14, 2009 4:54 PM   HGBA1C: 6.9%   (Normal Range: Non-Diabetic - 3-6%   Control Diabetic - 6-8%)  Comments: ...........test performed by...........[name] KitchenTerese Door, CMA      Prevention & Chronic Care Immunizations   Influenza vaccine: Fluvax MCR  (04/28/2009)   Influenza vaccine deferral: Not available  (03/17/2009)   Influenza vaccine due: 06/01/2009    Tetanus  booster: 08/23/2008: given   Tetanus booster due: 08/23/2018    Pneumococcal vaccine: Pneumovax (Medicare)  (06/01/2009)  Colorectal Screening   Hemoccult: Not documented   Hemoccult due: Not Indicated    Colonoscopy: Hyperplastic Polyp  (11/03/2006)   Colonoscopy due: 11/02/2016  Other Screening   PSA: 0.12  (10/07/2006)   PSA action/deferral: Not indicated  (03/17/2009)   PSA due due: 10/07/2007   Smoking status: current  (12/14/2009)   Smoking cessation counseling: yes  (12/14/2009)  Diabetes Mellitus   HgbA1C: 6.9  (  12/14/2009)   HgbA1C action/deferral: Ordered  (06/01/2009)   Hemoglobin A1C due: 09/29/2007    Eye exam: diabetic retinopathy  (12/13/2009)   Diabetic eye exam action/deferral: Ophthalmology referral  (09/12/2009)   Eye exam due: 11/2010    Foot exam: yes  (09/12/2009)   High risk foot: Not documented   Foot care education: Not documented   Foot exam due: 03/16/2010    Urine microalbumin/creatinine ratio: Not documented   Urine microalbumin action/deferral: Ordered   Urine microalbumin/cr due: 06/30/2008    Diabetes flowsheet reviewed?: Yes   Progress toward A1C goal: At goal  Lipids   Total Cholesterol: 140  (11/09/2008)   LDL: 75  (11/09/2008)   LDL Direct: 72  (07/21/2008)   HDL: 38  (11/09/2008)   Triglycerides: 137  (11/09/2008)   Lipid panel due: 11/30/2009  Hypertension   Last Blood Pressure: 112 / 74  (12/14/2009)   Serum creatinine: 1.18  (10/31/2009)   Serum potassium 4.4  (10/31/2009)    Hypertension flowsheet reviewed?: Yes   Progress toward BP goal: At goal  Self-Management Support :   Personal Goals (by the next clinic visit) :     Personal A1C goal: 7  (03/17/2009)     Personal blood pressure goal: 130/80  (09/12/2009)     Personal LDL goal: 70  (03/17/2009)     Home glucose monitoring frequency: 2 times a day  (09/12/2009)    Diabetes self-management support: Written self-care plan  (10/31/2009)    Diabetes  self-management support not done because: Good outcomes  (12/14/2009)    Hypertension self-management support: Written self-care plan  (10/31/2009)    Hypertension self-management support not done because: Good outcomes  (12/14/2009)   Appended Document: F/U/KH    Clinical Lists Changes  Medications: Changed medication from LANTUS 100 UNIT/ML SOLN (INSULIN GLARGINE) Take 50 units in AM and 50 units in PM. Dispense 1 month supply as well as needles and syringes. to LANTUS 100 UNIT/ML SOLN (INSULIN GLARGINE) Take 50 units in AM and 100 units in PM. Dispense 1 month supply as well as needles and syringes. - Signed Removed medication of METFORMIN HCL 500 MG TABS (METFORMIN HCL) take one daily for 7 days, then twice a day for 7 days, then two pills twice a day until the end of the month. Changed medication from FUROSEMIDE 20 MG TABS (FUROSEMIDE) take one as needed for leg swelling.  May take up to 2 per day. to FUROSEMIDE 20 MG TABS (FUROSEMIDE) take one as needed for leg swelling.  May take up to 2 per day. - Signed Rx of LANTUS 100 UNIT/ML SOLN (INSULIN GLARGINE) Take 50 units in AM and 100 units in PM. Dispense 1 month supply as well as needles and syringes.;  #1 x 6;  Signed;  Entered by: Ellery Plunk MD;  Authorized by: Ellery Plunk MD;  Method used: [facility] [address] [phone]*, [address], [place], [place], [place]  [address], Ph: [phone] or [phone], Fax: [phone] Rx of FUROSEMIDE 20 MG TABS (FUROSEMIDE) take one as needed for leg swelling.  May take up to 2 per day.;  #60 x 3;  Signed;  Entered by: Ellery Plunk MD;  Authorized by: Ellery Plunk MD;  Method used: [facility]ically [facility] [address] [phone]*, [address] [place], [place], [place]  [address], Ph: [phone] or [phone], Fax: [phone] Rx of ACCUPRIL 5 MG TABS (QUINAPRIL HCL) Take 1/2 tablet daily for BP;  #30 x 6;  Signed;  Entered by: Ellery Plunk MD;   Authorized by: Ellery Plunk MD;  Method used: Electronically to [facility]  [facility] [address] [phone]*, [address] [place], [place], [place]  [address], Ph: [phone] or [phone], Fax: [phone] Rx of METFORMIN HCL 1000 MG TABS (METFORMIN HCL) take one twice a day;  #60 x 6;  Signed;  Entered by: Ellery Plunk MD;  Authorized by: Ellery Plunk MD;  Method used: Electronically to [facility]  [facility] [address] [phone]*, [address] [place], [place], [place]  [address], Ph: [phone] or [phone], Fax: [phone]    Prescriptions: METFORMIN HCL 1000 MG TABS (METFORMIN HCL) take one twice a day  #60 x 6   Entered and Authorized by:   Ellery Plunk MD   Signed by:   Ellery Plunk MD on 12/14/2009   Method used:   Electronically to        [facility] [address] [phone]* (retail)       [address]       [place], [place]  [address]       Ph: [phone] or [phone]       Fax: [phone]   RxID:   [id number] ACCUPRIL 5 MG TABS (QUINAPRIL HCL) Take 1/2 tablet daily for BP  #30 x 6   Entered and Authorized by:   Ellery Plunk MD   Signed by:   Ellery Plunk MD on 12/14/2009   Method used:   Electronically to        [facility] [address] [phone]* (retail)       [address]       [place], [place]  [address]       Ph: [phone] or [phone]       Fax: [phone]   RxID:   [id number] FUROSEMIDE 20 MG TABS (FUROSEMIDE) take one as needed for leg swelling.  May take up to 2 per day.  #60 x 3   Entered and Authorized by:   Ellery Plunk MD   Signed by:   Ellery Plunk MD on 12/14/2009   Method used:   Electronically to        [facility]  [facility] [address] [phone]* (retail)       [address]       [place], [place]  [address]       Ph: [phone] or [phone]       Fax: [phone]   RxID:   [id number] LANTUS 100 UNIT/ML SOLN (INSULIN GLARGINE) Take 50 units in AM and 100 units in  PM. Dispense 1 month supply as well as needles and syringes.  #1 x 6   Entered and Authorized by:   Ellery Plunk MD   Signed by:   Ellery Plunk MD on 12/14/2009   Method used:   Electronically to        [facility] [address] [phone]* (retail)       [address]       [place], [place]  [address]       Ph: [phone] or [phone]       Fax: [phone]   RxID:   [id number]

## 2010-07-31 NOTE — Progress Notes (Signed)
Summary: refill  Phone Note Refill Request Call back at [phone] Message from:  Patient  Refills Requested: Medication #1:  VIAGRA 100 MG TABS Take 1 tablet by mouth one hour before sexual relations [facility]  Initial call taken by: De Nurse,  July 06, 2009 8:56 AM  Follow-up for Phone Call        left message.(med last filled 06/01/09 & has refills) will ask about this when he calls Follow-up by: Golden Circle RN,  July 06, 2009 9:17 AM  Additional Follow-up for Phone Call Additional follow up Details #1::        Pt returned call. Additional Follow-up by: Clydell Hakim,  July 06, 2009 11:12 AM    Additional Follow-up for Phone Call Additional follow up Details #2::    states he never got it filled due to high cost. wants it sent to health department. sent Follow-up by: Golden Circle RN,  July 06, 2009 11:31 AM  Prescriptions: VIAGRA 100 MG TABS (SILDENAFIL CITRATE) Take 1 tablet by mouth one hour before sexual relations  #15 x 5   Entered by:   Golden Circle RN   Authorized by:   Ellery Plunk MD   Signed by:   Golden Circle RN on 07/06/2009   Method used:   Printed then faxed to ...       [facility] (retail)       [address] [place], [place]  [address]       Ph: [phone]       Fax: [phone]   RxID:   [id number]  faxed to health dept.Golden Circle RN  July 06, 2009 11:34 AM

## 2010-07-31 NOTE — Letter (Signed)
Summary: Work Excuse  [facility]  [address]   [place], [place] [address]   Phone: [phone]  Fax: [phone]    Today's Date: December 14, 2009  Name of Patient: Adam Ray  The above named patient had a medical visit today at: 4:00 pm.  Please take this into consideration when reviewing the time away from work/school.    Special Instructions:  [x  ] None  [  ] To be off the remainder of today, returning to the normal work / school schedule tomorrow.  [  ] To be off until the next scheduled appointment on ______________________.  [  ] Other ________________________________________________________________ ________________________________________________________________________   Sincerely yours,   Loralee Pacas CMA

## 2010-08-02 NOTE — Assessment & Plan Note (Signed)
Summary: [facility]   Vital Signs:  Patient profile:   55 year old male Height:      67.5 inches Weight:      167.50 pounds BMI:     25.94 BSA:     1.89 Temp:     98.3 degrees F Pulse rate:   64 / minute BP sitting:   132 / 79  Vitals Entered By: Jone Baseman CMA (June 13, 2010 2:04 PM) CC: f/u Is Patient Diabetic? Yes Did you bring your meter with you today? No Pain Assessment Patient in pain? no        Primary Care Provider:  Ellery Plunk MD  CC:  f/u.  History of Present Illness: pt here to discuss getting benefits through the [place] for agent orange exposure.  Pt brought several forms with him, but none of them needed to be filled out by me.  I looked up information online about agent orange and DM II for him, including the number of the clinic he needed to call.  together we called his VA representative and confirmed what he needed to do.  I spent 20 minutes counseling and helping this pt.  Habits & Providers  Alcohol-Tobacco-Diet     Alcohol drinks/day: 0     Tobacco Status: current     Tobacco Counseling: to quit use of tobacco products     Cigarette Packs/Day: 1.0     Diet Comments: not watching his diet     Diet Counseling: not indicated; diet is assessed to be healthy  Allergies: No Known Drug Allergies   Impression & Recommendations:  Problem # 1:  DIAB W/OPHTH MANIFESTS TYPE II/UNS NOT UNCNTRL (ICD-250.50) Assessment Unchanged pt thinks he will be able to get VA assistance for this.  I have told him I will fill out additional forms for him if necessary His updated medication list for this problem includes:    Lantus 100 Unit/ml Soln (Insulin glargine) .[name]... Take 50 units in am and 100 units in pm. dispense 1 month supply as well as needles and syringes.    Accupril 5 Mg Tabs (Quinapril hcl) .[name]... Take 1/2 tablet daily for bp    Baby Aspirin 81 Mg Chew (Aspirin) .[name]... Take one tablet by mouth daily    Metformin Hcl 1000 Mg Tabs (Metformin hcl) .[name]...  Take one twice a day  Complete Medication List: 1)  Lantus 100 Unit/ml Soln (Insulin glargine) .... Take 50 units in am and 100 units in pm. dispense 1 month supply as well as needles and syringes. 2)  Truetrack Test Strp (Glucose blood) .... Use 2 times a day 3)  Accupril 5 Mg Tabs (Quinapril hcl) .... Take 1/2 tablet daily for bp 4)  Baby Aspirin 81 Mg Chew (Aspirin) .... Take one tablet by mouth daily 5)  Viagra 100 Mg Tabs (Sildenafil citrate) .... Take 1 tablet by mouth one hour before sexual relations 6)  Furosemide 20 Mg Tabs (Furosemide) .... Take one as needed for leg swelling.  may take up to 2 per day. 7)  Metformin Hcl 1000 Mg Tabs (Metformin hcl) .... Take one twice a day  Appended Document: [facility]    Clinical Lists Changes  Orders: Added new Test order of [facility]- Est Level  3 ([address]) - Signed

## 2010-08-02 NOTE — Miscellaneous (Signed)
Summary: form for drug  company  Clinical Lists Changes patient brought in form for dr. Hulen Luster to complete regarding getting viagra thru [facility] thru [employer]. MD completed form and RN left message on his voicemail that form may be picked up on 06/26/2010. advised patient on voice message that this med cannot be shipped to our office , must be shipped to his home. Theresia Lo RN  June 21, 2010 4:56 PM

## 2010-09-05 ENCOUNTER — Encounter: Payer: Self-pay | Admitting: Family Medicine

## 2010-09-05 ENCOUNTER — Ambulatory Visit (INDEPENDENT_AMBULATORY_CARE_PROVIDER_SITE_OTHER): Payer: Medicare Other | Admitting: Family Medicine

## 2010-09-05 VITALS — BP 109/74 | HR 84 | Temp 98.3°F | Wt 162.0 lb

## 2010-09-05 DIAGNOSIS — F17200 Nicotine dependence, unspecified, uncomplicated: Secondary | ICD-10-CM

## 2010-09-05 DIAGNOSIS — E1139 Type 2 diabetes mellitus with other diabetic ophthalmic complication: Secondary | ICD-10-CM

## 2010-09-05 DIAGNOSIS — G4730 Sleep apnea, unspecified: Secondary | ICD-10-CM

## 2010-09-05 MED ORDER — SILDENAFIL CITRATE 100 MG PO TABS: 100.0000 mg | ORAL_TABLET | ORAL | Status: DC | PRN

## 2010-09-05 NOTE — Patient Instructions (Signed)
[facility] [address] [place], [address] [place], [place] [address] Phone #: [phone]  Please give [place] a call and find out when to return for a recheck Come back to see me in one month for a recheck of diabetes- please write down 1-2 weeks of early morning (without eating) blood sugars and bring that in We will call with your sleep study appt Next time you come in, you will be ready to quit smoking Take your aspirin every day

## 2010-09-06 DIAGNOSIS — G4730 Sleep apnea, unspecified: Secondary | ICD-10-CM | POA: Insufficient documentation

## 2010-09-06 NOTE — Progress Notes (Signed)
  Subjective:    Patient ID: Adam Ray, male    DOB: October 09, 1955, 55 y.o.   MRN: [id number]  HPI  Colonoscopy- has been 4 years since last colonoscopy, had a polyp in last exam.  Not sure when he is supposed to go back.  No changes in stool, no blood, no weight loss.  DM- taking lantus, has not changed diet or exercise routine.  Only taking ASA occasionally.  No CP, palpiations, no symptomatic lows.    Smoking- has thought about quitting, but is not ready yet.  Would like to try patch, not chantix  Snoring- wife is complaining, has some daytime sleepiness.  Would like sleep study.  Review of Systems No HA, blurry vision.    Objective:   Physical Exam     Vital signs reviewed General appearance - alert, well appearing, and in no distress and oriented to person, place, and time Heart - normal rate, regular rhythm, normal S1, S2, no murmurs, rubs, clicks or gallops Chest - clear to auscultation, no wheezes, rales or rhonchi, symmetric air entry, no tachypnea, retractions or cyanosis Extremities - peripheral pulses normal, no pedal edema, no clubbing or cyanosis     Assessment & Plan:

## 2010-09-06 NOTE — Assessment & Plan Note (Signed)
New.  Will send for sleep study, eval for CPAP.

## 2010-09-06 NOTE — Assessment & Plan Note (Signed)
Set goal quit date of one month from now.  Will rx patch at next visit.  Pt wants lowest dose.

## 2010-09-06 NOTE — Assessment & Plan Note (Addendum)
Worse.   Lab Results  Component Value Date   HGBA1C 9.1 09/05/2010  pt states he has not made any changes.  Likely need to increase insulin, however pt without any CBGs.  Asked pt to check AM CBGs and RTC in one month for reeval.    Encouraged pt to take ASA daily

## 2010-09-18 LAB — POCT URINALYSIS DIP (DEVICE)
Bilirubin Urine: NEGATIVE
Ketones, ur: NEGATIVE mg/dL
pH: 5.5 (ref 5.0–8.0)

## 2010-10-02 LAB — CBC
Hemoglobin: 14.2 g/dL (ref 13.0–17.0)
Platelets: 123 10*3/uL — ABNORMAL LOW (ref 150–400)
RDW: 14.4 % (ref 11.5–15.5)

## 2010-10-02 LAB — COMPREHENSIVE METABOLIC PANEL
ALT: 23 U/L (ref 0–53)
AST: 21 U/L (ref 0–37)
Albumin: 3.6 g/dL (ref 3.5–5.2)
Alkaline Phosphatase: 118 U/L — ABNORMAL HIGH (ref 39–117)
GFR calc Af Amer: >60 mL/min (ref >60)
Glucose, Bld: 240 mg/dL — ABNORMAL HIGH (ref 70–99)
Potassium: 4.4 mEq/L (ref 3.5–5.1)
Sodium: 135 mEq/L (ref 135–145)
Total Protein: 6.4 g/dL (ref 6.0–8.3)

## 2010-10-02 LAB — DIFFERENTIAL
Basophils Relative: 1 % (ref 0–1)
Eosinophils Absolute: 0.2 10*3/uL (ref 0.0–0.7)
Eosinophils Relative: 5 % (ref 0–5)
Monocytes Absolute: 0.4 10*3/uL (ref 0.1–1.0)
Monocytes Relative: 10 % (ref 3–12)

## 2010-10-02 LAB — URINALYSIS, ROUTINE W REFLEX MICROSCOPIC
Ketones, ur: NEGATIVE mg/dL
Nitrite: NEGATIVE
pH: 6.5 (ref 5.0–8.0)

## 2010-10-02 LAB — GLUCOSE, CAPILLARY

## 2010-10-06 LAB — POCT I-STAT, CHEM 8
BUN: 20 mg/dL (ref 6–23)
Creatinine, Ser: 1.2 mg/dL (ref 0.4–1.5)
Glucose, Bld: 282 mg/dL — ABNORMAL HIGH (ref 70–99)
Hemoglobin: 15 g/dL (ref 13.0–17.0)
TCO2: 25 mmol/L (ref 0–100)

## 2010-11-08 ENCOUNTER — Ambulatory Visit: Payer: Medicare Other | Admitting: Family Medicine

## 2010-11-09 ENCOUNTER — Encounter: Payer: Self-pay | Admitting: *Deleted

## 2010-11-13 NOTE — Assessment & Plan Note (Signed)
[facility]   NAME:  Adam Ray, Adam Ray NO.:  [id number]   MEDICAL RECORD NO.:  [id number]      DATE OF BIRTH:  Feb 22, 1956   PHYSICIAN:  Maxwell Caul, M.D. VISIT DATE:  07/24/2007                                   OFFICE VISIT   [name] is a gentleman I saw last week.  He had a recurrent ulcer over  his right first metatarsal head.  This wound was debrided. Culture was  done and an x-ray was done.  The x-ray came back showing osteomyelitis.  CNS actually grew Pseudomonas although I do not believe he was  prescribed antibiotics.   PHYSICAL EXAMINATION:  On examination temperature is 98.9, pulse 90,  respirations 18, blood pressure 136/72.  The area over his right first  metatarsal head was malodorous and draining.  This probes easily to  bone.  I believe there is probably ascending cellulitis on the side of  the foot and perhaps up the leg, although I am less certain about the  latter.   IMPRESSION:  Diabetic foot with foot ulcer culturing Pseudomonas.  I  think he would need hospitalization for intravenous antibiotics directed  against Pseudomonas.  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   I have referred him to the ER for admission.           ______________________________  Maxwell Caul, M.D.     MGR/MEDQ  D:  07/24/2007  T:  07/25/2007  Job:  [id number]

## 2010-11-13 NOTE — Assessment & Plan Note (Signed)
[facility]   NAME:  Adam Ray, Adam Ray NO.:  [id number]   MEDICAL RECORD NO.:  [id number]      DATE OF BIRTH:  11/18/1955   PHYSICIAN:  Maxwell Caul, M.D. VISIT DATE:  07/11/2008                                   OFFICE VISIT   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  The last time we discharged him I believe  we gave him prescriptions for diabetic footwear.  He has problems with  being able to afford these, although he is quite determined to obtain  them this time.  He is a poorly controlled diabetic.  Notes from his  primary physician noted a hemoglobin A1c of 10.3.  He continues to  smoke.  His compliance with medical suggestions is less than 100%,  although he is legitimately concerned about the callus on his left foot.   On examination, the circulation in his peripheral pulses in his left  foot were palpable, not in the right.  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  Does not have any open areas on the right leg and he  does not describe claudication.   On the left foot, there was a thick callus over the left third  metatarsal head.  This was fully excised.  Fortunately, there is no  underlying wound here.  There was also wound on the lateral aspect of  his first metatarsophalangeal, this was also fully excised, and again  there was no open wound.   IMPRESSION:  Type 2 diabetes with diabetic neuropathy and probably  peripheral vascular disease essentially on the right.  I think it would  be very important to obtain diabetic foot wear.  Where there is callus,  there is excessive pressure and excessive pressure is the precursor for  diabetic wounds.  Fortunately, he does not have any of these at present.  I have once again written a prescription for custom diabetic shoes with  diabetic footwear.   According to the patient, his insurance copay for  this is 130 dollars.  He is not certain he can afford it but will try.  There is a fund here to help support patients with purchases of lower  extremity orthotics related to their diabetes.  I told him if he cannot  afford these in the next month or two, to get back with [place] and I will  see if he is eligible.  In the meantime, he has no open wound.  I  believe he has peripheral vascular disease on the right.  However, he is  asymptomatic from this.  He was given a strong personal statement from  me to consider discontinuing smoking, although I do not think he is that  interested.  We have not made firm arrangements here to see him again,  but would be available to his physicians as required.  He is certainly  at extreme risk for diabetic foot problems in the future.           ______________________________  Maxwell Caul, M.D.     MGR/MEDQ  D:  07/11/2008  T:  07/12/2008  Job:  [id number]

## 2010-11-13 NOTE — Discharge Summary (Signed)
NAME:  Adam Ray, Adam Ray NO.:  [id number]   MEDICAL RECORD NO.:  [id number]          PATIENT TYPE:  INP   LOCATION:  5157                         FACILITY:  [facility]   PHYSICIAN:  Nestor Ramp, MD        DATE OF BIRTH:  12-18-1955   DATE OF ADMISSION:  07/25/2007  DATE OF DISCHARGE:  07/31/2007                               DISCHARGE SUMMARY   PRIMARY CARE PHYSICIAN:  Dr. Alanda Amass at the [facility].   DISCHARGE DIAGNOSES:  1. 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  2. Diabetes mellitus, type 2, complicated.   DISCHARGE MEDICATIONS:  1. Aspirin 81 mg daily.  2. Humulin 70/30 insulin 22 units b.i.d.  3. Omeprazole 20 mg daily.  4. Lisinopril 2.5 mg daily.  5. Levaquin 750 mg 1 daily for 6 weeks.   BRIEF HPI:  Adam Ray is a 55 year old male with poorly controlled  diabetes mellitus, his last A1c being 8.9.  He presented to the [facility] ED with a nonhealing foot wound.  He had been seen both in the  family practice clinic and at wound care center recently.  The wound had  been going on chronically since at least November.  A plain film taken  at that time was not concerning for osteomyelitis, however, a week prior  to admission an x-ray was suspicious for osteomyelitis and MRI was  recommended.  Of note, he had been noncompliant with the wound care  center, not keeping all of his appointments, and had been absent for  over a month in between his visit in November and being seen in January.  In that time frame, per documentation, his foot ulcer did worsen  considerably.  A culture was taken in the family practice center and  when it came back it did show Pseudomonas aeruginosa.  He was put on  Levaquin 750 mg for 10 days.  However, due to insurance mix up, his  prescription was unable to be filled and he never began taking the  medicine.   Considerably, he presented to the [facility] ED with  increased drainage and pain from this nonhealing wound.   HOSPITAL COURSE:  1. Adam Ray was admitted to be placed on IV antibiotics and to be      seen by orthopedics for treatment options and recommendations      regarding his nonhealing foot wound.  An MRI was obtained which did      show osteomyelitis at 75% of his first metatarsal and the base of      proximal phalangeal bone of the right great toe.  He was seen by      ortho who laid out the treatment options and upon discussion with      the patient per their professional recommendations believed that      amputation was the best mode of treatment for Adam Ray.  He was  agreeable and he did undergo amputation of his right great toe and      first metatarsal ray on July 29, 2007.  He tolerated the      procedure well and please see an operative note for details.      Following the amputation, he was maintained on Zosyn which he had      been on prior to the operation.  He continued to do very well.  His      wounds looked good on the day of discharge.  He was seen by PT who      fitted him with a special boot and instructed him on the use of      crutches.  On the day of discharge, patient was ambulating very      well using his crutches.  Adam Ray was cleared by orthopedics for      discharge with home health to do wound checks and for him to follow      up in their office in 2 to 3 weeks following discharge.  They did      recommend oral antibiotic course for the remainder of his infection      as the osteomyelitis was essentially cured by the amputation and he      was basically left with a soft tissue infection for which they did      not believe IV antibiotics were necessary.  Therefore, he will be      discharged on a 6-week course of Levaquin, to follow up with      orthopedics in 2 to 3 weeks, and have wound care checks by a home      health RN.  2. Regarding  Adam Ray's diabetes mellitus, on admission he was      Humulin 70/30 insulin 50 units subcu daily and his insulin was over      400 when he was first admitted to the hospital.  He was placed on      his home regimen and quickly became hypoglycemic.  His regimen was      therefore backed off to half of his home regimen 22 units b.i.d.      and his sugars were maintained within a reasonable range on this      regimen both prior to the amputation and after with his highest      sugar on this regimen being 250, his sugars ranging 169 to 189.  He      had had multiple hypoglycemic events, however, on higher doses of      insulin therefore he will be discharged on 22 units of Humulin      70/30 b.i.d.  He feels he was possibly on higher regimen at home      due to both noncompliance and therefore titration up by the clinic      as well as his nonhealing infection which would be a reason his      sugars would be fluctuating widely and having significant highs.      He is instructed to take his insulin 22 units b.i.d. and check his      sugar both in the morning and 2 hours after lunch daily until he is      seen in the hospital followup clinic at the family practice center.      He is to record these values and bring them with him to his      appointment so that  his insulin may be adjusted as needed as I      suspect his diet is poor when he is not in the hospital and he may      have some degree of noncompliance at home.   DISPOSITION:  The patient will be discharged home with home health  arranged to come to his house and do wound care checks.   DISPOSITION AT THE TIME OF DISCHARGE:  Stable.   FOLLOWUP:  Adam Ray has an appointment at the [facility] on Thursday, February 5th at 9  o'clock in the morning.  He is also to follow up with Dr. Luiz Blare, the  orthopedic surgeon, in his office in 2 to 3 weeks.  Patient will  schedule this on his own  at a time that suits his schedule.  He is aware  of the need to make this appointment.  He was given contact information  for Dr. Luiz Blare' office which phone number being [phone].  Followup  issues for Adam Ray at his followup with his primary care physician  would be:  1. To ensure his wound is continuing to heal.  Orthopedics did state      that should his wound begin to look infected that we could switch      to IV antibiotics; however, they feel he should do fine on oral.  2. His post discharge sugars should be reviewed and his insulin      adjusted as necessary.  I do anticipate that adjustments will need      to be made.  However, given his history of severe lows here and a      reasonable control on his current regimen, I will discharge him on      the regimen he has been on in the hospital.  3. Please ensure that he has made his followup appointment with Dr.      Luiz Blare for 2 to 3 weeks after discharge.      Ardeen Garland, MD  Electronically Signed      Nestor Ramp, MD  Electronically Signed    LM/MEDQ  D:  07/31/2007  T:  07/31/2007  Job:  [phone]

## 2010-11-13 NOTE — Consult Note (Signed)
NAME:  Adam Ray, Adam Ray                  ACCOUNT NO.:  [id number]   MEDICAL RECORD NO.:  [id number]          PATIENT TYPE:  REC   LOCATION:  FOOT                         FACILITY:  [facility]   PHYSICIAN:  Jonelle Sports. Sevier, M.D. DATE OF BIRTH:  [birth year]   DATE OF CONSULTATION:  06/10/2007  DATE OF DISCHARGE:                                 CONSULTATION   HISTORY OF PRESENT ILLNESS:  This 55 year old black male is seen with a  new wound on the medial aspect of the first MP joint of the right foot.   The patient had been seen here in the past most recently in May of this  year with a wound in the same area which was thought to have its basis  on some earlier gout in that joint.  Since that wound has healed he had  been simply in a sneaker type shoe and apparently had had no difficulty  until approximately three weeks ago when he noted the area began to get  a little hard and to swell again.  He has not had any episodes  suggesting an acute gout attack at that area.   As the time progressed he noted that it took on a more blister like  characteristic in that on one occasion a small amount of what he thought  was pus came out.  He consulted his primary doctor and was begun on  Augmentin 875 mg twice a day.  This is about 12 days ago and __________  was begun.  Since that time the wound has continued to drain, but has  had no other specific treatment.   He arrives today for further evaluation and advice.   Since the time of the last visit he has had no new medical problems of  any consequence and no change in medications other than the recent  addition of the Augmentin.   PHYSICAL EXAMINATION:  VITAL SIGNS:  Blood pressure 121/76, pulse 72,  respirations 18, temperature 98.7, blood sugar 226 mg/dL.  ULCERATION:  On the right foot on the medial aspect of the first MP  joint is an open ulceration which prior to debridement measures 2.1 x  2.5 x approximately 0.2 cm.  It has very shaggy margins  of loose skin.  There is some maceration, some evidence of earlier bleeding.  There is  no untoward odor.  There is no evidence of spreading inflammation or  deep infection at this time.  There is some chronic edema and chronic  stasis skin changes of that extremity involving primarily the distal  lower leg but not so much the foot.   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   DISPOSITION:  The wound is partial thickness debrided of the loose skin  and some other debris and clotted blood.  The wound does bleed freely  during debridement, and silver nitrate is used for cautery.  It provides  a fairly clean wound base.  There is one slightly nodular area  at  approximately 1 o'clock in the wound, but I do not believe this  represents a tophus or certainly there is no clear penetration at this  point to bone or joint capsule.   The wound is dressed with an application of Aquacel AG.  This is covered  with a SoftSorb pad and a bulky dressing.  The foot is then placed in a  healing sandal.   The patient is advised to stay off his feet as much as possible to keep  the dressing clean and dry and to complete the two additional days of  antibiotic that he has available to him.   Follow-up visit will be here in one week.           ______________________________  Jonelle Sports. Cheryll Cockayne, M.D.     RES/MEDQ  D:  06/10/2007  T:  06/10/2007  Job:  [id number]

## 2010-11-13 NOTE — Op Note (Signed)
NAME:  KIMI, KROFT NO.:  [id number]   MEDICAL RECORD NO.:  [id number]          PATIENT TYPE:  INP   LOCATION:  5157                         FACILITY:  [facility]   PHYSICIAN:  Harvie Junior, M.D.   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   PRINCIPAL PROCEDURE:  First great ray amputation.   SURGEON:  Harvie Junior, M.D.   ASSISTANT:  Marshia Ly, P.A.-C.   ANESTHESIA:  General.   BRIEF HISTORY:  Mr. Cauthorn is a 55 year old male with a long history of  having had significant ulceration over his MTP joint on the medial side.  He had had drainage from it multiple times.  He came in for evaluation  and MRI showed that he had severe osteomyelitis of the entire first  metatarsal as well as osteomyelitis of the proximal phalanx of the great  toe.  ABIs were obtained which showed that he had excellent blood flow  and ultimately, we discussed with the patient the treatment options  including long term antibiotics versus amputation.  He elected to  undergo amputation.  He is brought to the operating room for this  procedure.   DESCRIPTION OF PROCEDURE:  The patient was taken to the operating room.  After adequate anesthesia was obtained with general anesthetic, the  patient was positioned on the operating table.  The right leg was  prepped and draped in the usual sterile fashion.  Following this,  attention was turned to the great toe where a linear incision was made  down the great ray and we made an ellipse around the great toe.  The  great toe was then dissected in full thickness  flaps down to the  metatarsal and flaps were raised medial and lateral and then the great  toe was amputated along with the proximal ray with full thickness flaps  medial and lateral.  All the tendinous insertions and periosteum were  excised with the great ray.  Once this was completed, the metatarsal  cuneiform joint was exploited to allow removal of the first ray.  At  this point, the tourniquet was let down and bleeding was controlled with  electrocautery.  The wound was copiously and thoroughly irrigated with  pulsatile lavage irrigation.  Bone cultures were sent from the great  metatarsal head and wound cultures were sent as well as a specimen to  pathology.  The wound was then  closed with central closure of the skin with mattress interrupted  sutures and a Penrose drain was placed.  A sterile compressive dressing  was applied and the patient taken to the recovery room where he was  noted to be in satisfactory condition.  Estimated blood  loss for the  procedure was 100 mL.      Harvie Junior, M.D.  Electronically Signed     JLG/MEDQ  D:  07/29/2007  T:  07/29/2007  Job:  [id number]

## 2010-11-13 NOTE — Assessment & Plan Note (Signed)
[facility]   NAME:  Adam Ray, Adam Ray NO.:  [id number]   MEDICAL RECORD NO.:  [id number]      DATE OF BIRTH:  [birth year]   PHYSICIAN:  Maxwell Caul, M.D. VISIT DATE:  07/17/2007                                   OFFICE VISIT   PURPOSE OF TODAY'S VISIT:  The patient returns with deterioration of a  previously treated ulcer on his right first metatarsal head on the  lateral aspect.   [name] returns today having not been seen here in a month.  He was  treated here in December for an ulcer on the lateral aspect of his first  metatarsal phalangeal joint.  He is a diabetic and he tells [place] that the  area has been draining but he has not had any pain or fever.  When he  was last here he almost had this healed.  He was using his own modified  healing sandal.   EXAMINATION:  His temperature is 98.4, pulse 73, respirations 18, blood  pressure is 125/69.  The area over his right lateral aspect of his right  metatarsal head had deteriorated quite a bit from what it was previously  described on December 17.  The area had drainage, a bit of malodor.  There was a significant amount of callus.  All of this underwent a full-  thickness debridement of a large amount a callus and viable tissue to  define wound edges.  Even after this extensive debridement there was  still about a centimeter of overhang of viable tissue from about six to  nine o'clock looking at the wound.  The area was cultured for C&S,  although I did not find any areas of truly necrotic material.   IMPRESSION:  1. Wagner's grade 2, possibly grade 3 recurrent diabetic foot wound.      I have cultured this area and I am going to send him for an x-ray      to make sure there is no obvious osteomyelitis in this area.  I      have not given him antibiotics.  We have applied Aquacel AG, a      SofSorb pad and I am going to put him back in a healing sandal and      see him again next week.   The patient tells me he does not spend      much time on his feet, although the degree of callus here would      suggest otherwise.           ______________________________  Maxwell Caul, M.D.     MGR/MEDQ  D:  07/17/2007  T:  07/17/2007  Job:  [id number]

## 2010-11-13 NOTE — Op Note (Signed)
NAME:  Adam Ray, Adam Ray                  ACCOUNT NO.:  [id number]   MEDICAL RECORD NO.:  [id number]          PATIENT TYPE:  INP   LOCATION:  5157                         FACILITY:  [facility]   PHYSICIAN:  Harvie Junior, M.D.   DATE OF BIRTH:  1956-05-27   DATE OF PROCEDURE:  07/25/2007  DATE OF DISCHARGE:  07/31/2007                               OPERATIVE REPORT   PREOPERATIVE DIAGNOSIS:  Infected right foot with osteomyelitis of the  first metatarsal.   POSTOPERATIVE DIAGNOSIS:  Infected right foot with osteomyelitis of the  first metatarsal.   PRINCIPAL PROCEDURE:  First ray amputation, right.   SURGEON:  Harvie Junior, M.D.   ASSISTANT:  Marshia Ly, P.A.   ANESTHESIA:  General anesthesia.   BRIEF HISTORY:  [name] is a 55 year old male with a long history of  having had a draining wound on his great toe, right side.  He had been  in and out of the hospital multiple times and put on antibiotics.  He  was ultimately recovered but he was just getting tired of doing dressing  changes and dealing with this wound.  When he came in this time, MRI was  obtained which showed unfortunately that he had a severe case of  osteomyelitis of the entire first metatarsal.  It, happily, did not seem  to involve any of the other bones of the foot.  We had a long discussion  with him about treatment options, although we felt that he probably  could get through the current hospitalization with antibiotics and time,  but he would continued to have issues related to the osteomyelitis.  We  did vascular studies which showed that he had reasonable blood flow and  ultimately felt that amputation of the great ray was appropriate.  We  discussed this with the patient and his family at length and ultimately  elected to undergo this procedure.  He was taken to the operating room  for this procedure.   PROCEDURE:  The patient was taken to the operating room.  After adequacy  anesthesia was obtained with  general anesthesia, the patient was placed  supine on the operating table.  The right foot was prepped and draped in  sterile fashion.  Following this, the leg was elevated for three minutes  and a blood pressure cuff was insufflated to 250 mmHg, used as a calf  tourniquet.  At this point, attention was turned to the great ray where  a linear incision was made along the great ray with a racket portion for  the great toe and the flaps were taken directly to bone and elevated  directly off of bone.  The first ray and great toe was then excised as a  single unit and sent to pathology.  At this point, the wound was  copiously and thoroughly irrigated, tourniquet was let down, bleeders  were controlled with electrocautery and the wound was closed with nylon  interrupted sutures over a deep drain.  A sterile  compressive dressing was applied as well as a hard sole shoe and the  patient was taken to the recovery room where he was noted to be in  satisfactory condition.   ESTIMATED BLOOD LOSS:  None.      Harvie Junior, M.D.  Electronically Signed     JLG/MEDQ  D:  08/20/2007  T:  08/20/2007  Job:  [id number]

## 2010-11-13 NOTE — Consult Note (Signed)
NAME:  Adam Ray, Adam Ray                  ACCOUNT NO.:  [id number]   MEDICAL RECORD NO.:  [id number]          PATIENT TYPE:  REC   LOCATION:  FOOT                         FACILITY:  [facility]   PHYSICIAN:  Jonelle Sports. Sevier, M.D. DATE OF BIRTH:  [birth year]   DATE OF CONSULTATION:  06/16/2007  DATE OF DISCHARGE:                                 CONSULTATION   HISTORY OF PRESENT ILLNESS:  This 55 year old black male is seen for an  ulcer on the first MP joint area of the right foot.  He does have  diabetes and there had been some question in the past that perhaps gout  was contributory to this, but I can find no evidence that that has ever  been clearly documented.   He does have significant degree of hallux valgus and __________  to the  return of the ulcer at this time (first return visit one week ago) was  based on pressure from inadequate footwear.   The patient has been treated since debridement last week simply with  daily cleansing and covered with a clean dressing.  To my surprise he  reports that he has been using hydrogen peroxide which was not our  recommendation to clean the wound.   He is unaware of any drainage, odor, swelling, pain or other  complication.  He does feel that there has been improvement in the  ulcer.  He has been wearing a sandal of his own which has been cut away  so as to remove all pressure from the area.   PHYSICAL EXAMINATION:  VITAL SIGNS:  Blood pressure 124/73, pulse 69,  respirations 18, temperature 98.2.  WOUNDS:  The wound now measures 1.2 x 1 x 0.1 cm with a nicely granular  base with minimal fibrinopurulent slough.  The wound margins are showing  evidence of advance, and the wound is quite healthy in appearance and  now somewhat boomerang in shape.   There is no evidence of infection and no apparent odor.   IMPRESSION:  Satisfactory healing diabetic foot ulcer right first MP  joint area.   DISPOSITION:  The selective debridement of some loose skin  and callus  from the margins of wound is carried out as well as selective  debridement of the fibrinous exudate of the wound base which is simply  cautiously scraped away with sharp instrument.   The wound is dressed with an application of Neosporin and covered with a  Band-Aid dressing.   The patient is advised at home to cleanse the wound daily using simply  warm soapy water preferably Dial soap, rinsing it well, patting it dry,  and then reapplying a Band-Aid.  He understands that he is to continue  in this cutaway shoe until we get complete healing.   Once he has healed we certainly should make every attempt to get him in  custom diabetic shoes with wide toe last.           ______________________________  Jonelle Sports. Cheryll Cockayne, M.D.     RES/MEDQ  D:  06/17/2007  T:  06/17/2007  Job:  [id number] 

## 2010-11-15 ENCOUNTER — Ambulatory Visit (INDEPENDENT_AMBULATORY_CARE_PROVIDER_SITE_OTHER): Payer: Medicare Other | Admitting: Family Medicine

## 2010-11-15 ENCOUNTER — Encounter: Payer: Self-pay | Admitting: Family Medicine

## 2010-11-15 VITALS — BP 125/75 | HR 76 | Temp 97.9°F

## 2010-11-15 DIAGNOSIS — I10 Essential (primary) hypertension: Secondary | ICD-10-CM

## 2010-11-15 DIAGNOSIS — Z0000 Encounter for general adult medical examination without abnormal findings: Secondary | ICD-10-CM

## 2010-11-15 DIAGNOSIS — E1139 Type 2 diabetes mellitus with other diabetic ophthalmic complication: Secondary | ICD-10-CM

## 2010-11-15 NOTE — Patient Instructions (Signed)
Come back in 1 month to talk about diabetes and your lab work Please make an appt for lab work in the AM Come without eating or drinking except for black coffee or water

## 2010-11-16 NOTE — Assessment & Plan Note (Signed)
[facility]   NAME:  JAVARIS, WIGINGTON NO.:  [id number]   MEDICAL RECORD NO.:  [id number]      DATE OF BIRTH:  09-Jan-1956   PHYSICIAN:  Jake Shark A. Tanda Rockers, M.D.      VISIT DATE:                                   OFFICE VISIT   SUBJECTIVE:  Mr. Labonte is a 55 year old man who was last seen in the  wound center in May of 2007.  At that time, he had been successfully  treated for a Wagner grade 2 diabetic foot ulcer.  He was discharged to  wear 30-40 mm support and we recommended that he procure custom shoes  and inserts.  Since that time, he has done well.  He has continued his  care with [facility].  He was recently seen by Dr. Carolanne Grumbling and noted to have an ulceration on the right hallux.  The  patient was referred for followup.  He continues on insulin 70/30 100  units daily.  There has been no interim trauma.   OBJECTIVE:  Blood pressure is 106/64, respirations are 20, pulse rate  67, temperature 98.4.  Capillary blood glucose is 258 mg%.  Inspection  of the lower extremity shows that there is trace edema with some  persistent hyperpigmentation changes consistent with stasis.  The pedal  pulses are 3+ bilaterally.  There are no ulcerations.  The patient  remains relatively insensate.  There is no evidence of active infection.   ASSESSMENT:  Historical Wagner grade 2 diabetic foot ulcer.   PLAN/RECOMMENDATION:  We have recommended that the patient procure the  custom insert and extra depth shoes.  We have given him a second  prescription for the same.  We have discharged him from the wound  center.  We will be happy to reevaluate him on a p.r.n. basis.  We have  encouraged him to comply with all the instructions that have been given  to him through the family practice clinic, including the routine  diabetic foot care.  The patient was given an opportunity to asks  questions.  He seems to understands and indicates that he  will be  compliant.  He is discharged.      Harold A. Tanda Rockers, M.D.  Electronically Signed     HAN/MEDQ  D:  12/02/2006  T:  12/02/2006  Job:  [id number]   cc:   French Ana L. Mayford Knife, M.D.

## 2010-11-16 NOTE — Assessment & Plan Note (Signed)
[facility]   NAME:  YAQUB, ARNEY NO.:  [id number]   MEDICAL RECORD NO.:  [id number]      DATE OF BIRTH:  Dec 27, 1955   PHYSICIAN:  Jonelle Sports. Sevier, M.D.  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 He has most recently been in a Profor  light wrap inside of a walking boot, which has allowed him to work in light  work.   PHYSICAL EXAMINATION:  VITAL SIGNS:  Examination today shows blood pressure  of 116/78, pulse 62 and regular. Respiratory rate 16, temperature 98.2.  EXTREMITIES:  On inspection of the wound, the area is 100% healed with any  open wound resolved.   PROCEDURE:  The area is partial thickness debrided today with removal of  callus and some dried skin but no open wound persists.   The area is treated with a leave-in pad to allow it to toughen and he is  returned to the walking boot.   FOLLOWUP:  He will be seen again in 1 week with a decision made about return  to ordinary foot wear and return to work at that time.           ______________________________  Jonelle Sports Cheryll Cockayne, M.D.     RES/MEDQ  D:  11/27/2005  T:  11/27/2005  Job:  [id number]

## 2010-11-16 NOTE — Assessment & Plan Note (Signed)
[facility]   NAME:  Adam Ray, Adam Ray NO.:  [id number]   MEDICAL RECORD NO.:  [id number]      DATE OF BIRTH:  Feb 20, 1956   PHYSICIAN:  Jonelle Sports. Sevier, M.D.       VISIT DATE:                                     OFFICE VISIT   VITAL SIGNS:  On examination today, Blood pressure 108/89, heart rate 66 and  regular, respirations 16, temperature 98.1.   PURPOSE OF TODAY'S VISIT:  This 55 year old black male is seen in continuing  follow-up for management of a diabetic ulcer of the medial aspect of the  first tarsal-metatarsal joint area; where he has considerable hallucis  valgus deformity.   WOUND EXAM:  This ulcer has gradually made progress, but has failed to heal  -- primarily because it has been necessary for him to continue working and  to wear a specific hard-toed footwear at work.   WOUND SINCE LAST VISIT:  The wound itself is largely covered with recurrent  callous; but, after appropriate full-thickness debridement the wound  dimensions are 0.8 x 0.5 x 0.3 cm.  In addition, this debridement has served  to create an area of superficial epithelial loss in the more distal portion  of the calloused area -- which measures 1.2 x 1.8 x 0.5 cm.   CHANGE IN INTERVAL MEDICAL HISTORY:  This represents some slight improvement  in the wound, but overall it has not progressed as is necessary.  I do not  think it will until we can achieve total off-loading and better local  therapy.   DIAGNOSES:  1.  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  2.  Diabetes mellitus type 2, chronically poorly controlled   TREATMENT:  Treatment today consisted of debridement full-thickness (as it  indicated above).  Then the application of Nitroban and an Allevyn pad.   CHANGE IN MEDICATIONS:  No changes made in his medications.   MANAGEMENT PLAN & GOAL:  Plan is to place him today in total contact  casting.  He has some arrangements to  make in the meanwhile, and so will  leave and return to the Clinic today at 1:00 p.m. for that purpose.   Following application of the total contact cast, subsequent follow-up will  be in one week.           ______________________________  Jonelle Sports. Cheryll Cockayne, M.D.    RES/MEDQ  D:  11/07/2005  T:  11/08/2005  Job:  [id number]

## 2010-11-16 NOTE — Consult Note (Signed)
NAME:  Adam Ray, Adam Ray                  ACCOUNT NO.:  [id number]   MEDICAL RECORD NO.:  [id number]           PATIENT TYPE:   LOCATION:  URG                          FACILITY:  [facility]   PHYSICIAN:  Jonelle Sports. Sevier, M.D.      DATE OF BIRTH:   DATE OF CONSULTATION:  08/14/2005  DATE OF DISCHARGE:                                   CONSULTATION   HISTORY:  This 55 year old black male is seen at the courtesy of Dr. Pasty Spillers  and Dr. Phillips Odor for evaluation of nonhealing ulcer of the right first  metatarsal phalangeal area, and also an apparently infected wound of the  distal right lower extremity in the pretibial area.   The patient is previously known to this clinic, having been here for the  same ulcer on his first MP joint area, in the fall of 2006. He was released  on August 23 for p.r.n. return with that lesion having healed.   The details of the history in the interim are not entirely clear, but  apparently this ulcer recurred and has failed to heal despite treatments  given through the clinic. He was apparently hospitalized October 26 through  November 1, at which time he had an MRI of that extremity; which showed  suspicion of osteomyelitis in the middle and distal phalanges of the fourth  digit, extensive forefoot cellulitis and confirmed the open lesion at the  first metatarsal head MP joint area. He was treated at that time apparently  with vancomycin and Zosyn, and was said to respond with decreased erythema,  etc.; and then was discharged on Cipro 500 mg every 6 hours and  metronidazole 500 mg every 8 hours -- which he has continued to the present.  These antibiotics were selected based on culture results, which showed gram  negative and gram positive rods, likely coag-negative Staphylococcus.   Apparently, since that time he has not healed the ulcer on his foot, and in  addition apparently sustained some blunt trauma to the distal pretibial area  on that side. Now the entire calf  of the leg is tensely swollen and the area  of the trauma has become locally swollen and fluctuant. That extremity is  quite warm.   Although his referral here is said to be for management of the MP joint  ulcer, it certainly would appear that the more urgent problem is that wound  of his distal lower extremity on that side.   PAST MEDICAL HISTORY:  Is recorded in the chart here, and is not reviewed at  this time.  It is noted that in late October he had BUN of 13 and a  creatinine of 1.0 .  He is said to be allergic to AUGMENTIN, and is  currently taking insulin for his diabetes ( 70/30 40 units b.i.d.),  metformin in uncertain dose; the Cipro and Flagyl at the doses indicated  above; and ibuprofen approximately six per day for pain.   EXAMINATION:  Examination Is limited to the right lower extremity. Pulses  there are not easily palpable because of his edema,  but on handheld Doppler  determination are found to be biphasic at both the dorsalis pedis and  posterior tibial areas. There is, however, persistence of a signal  throughout diastole, which may well simply be representative of the degree  of tense edema in that extremity. The extremity is indeed tensely edematous  to the knee.  There were no focal areas of tenderness on palpation over the  venous channels, and no palpable venous cords. On the distal pretibial area  is an area of skin thickening that appears to represent some degree of  eschar formation over former trauma; and surrounding this in an area of  approximately 5 x 4 cm is an area of palpable fluctuance. That entire  extremity is quite warm, with differential temperatures as compared to the  contralateral side to be approximately 3 degrees on both the foot and the  entire leg up to the knee.   On the foot, in the medial aspect of the first metatarsal head and first MP  joint area, is a lot of heavy callous formation with suspected underlying  ulceration.    DISPOSITION:  1.  The wound over the first metatarsal head area on the right foot is      extensively debrided of accumulated callous; and indeed there is found      underlying this, an open ulcer which after debridement measures 0.3 x      0.9 x 0.2 cm in dimension.  Probing of this suggests it does not lead      into the joint space or to the bone at this point.  2.  The fluctuant area in the distal pretibial area on that right lower      extremity is aspirated.  Several cc's of sanguinopurulent material were      obtained and sent for culture.  Both areas were treated with an      application of Neosporin and covered with dry dressings.  3.  The the patient is advised to stay off of work, and to remain at bed or      on the couch or recliner at home; with his heal elevated continuously      higher than his buttocks.  4.  For the present he is to continue the antibiotics as previously      described.  5.  He is scheduled for MRI of the right lower extremity tomorrow (which      would be as February 15).  Further disposition will be made following      that study and the knowledge of the cultures.   Efforts will be made to discuss this patient's care personally with Dr.  Pasty Spillers or  the residents in the internal medicine clinic.  It is certainly  conveyed to the patient and will be conveyed to them, that there is very  serious concern about the significant deep abscess and/or osteomyelitis in  this right lower extremity (especially in the tibial area).  If those things  exist, it may well ultimately lead to amputation of the extremity.   1.  The patient is given a follow-up visit here in 5 days, but if these      results become available in the interim, interim intervention may be      undertaken instead.  2.  The patient is, of course, advised to call us immediately should he      worsen.           ______________________________  Molly Maduro  Esperanza Richters, M.D.    RES/MEDQ  D:   08/14/2005  T:  08/14/2005  Job:  [id number]   cc:   [name], MD  Fax: [phone]   Clent Demark, M.D.  Fax: [phone]   Clare Charon, M.D.  Fax: [phone]

## 2010-11-16 NOTE — Assessment & Plan Note (Signed)
[facility]   NAME:  [name], [name] NO.:  [id number]   MEDICAL RECORD NO.:  [id number]      DATE OF BIRTH:  07-Aug-1955   PHYSICIAN:  Theresia Majors. Tanda Rockers, M.D. VISIT DATE:  11/13/2005                                     OFFICE VISIT   SUBJECTIVE:  [name] returns for followup of a diabetic foot ulcer on the  right first metatarsophalangeal joint on the lateral aspect of the foot.  In  the interim, he has been wearing a total contact cast.  He denies fever or  pain.  He is complaining of the heaviness of the cast.   OBJECTIVE:  VITAL SIGNS:  Blood pressure 110/75, pulse rate 74, respirations  16, and he is afebrile.  His blood sugar in the clinic this afternoon is  over 500 mg%.  The patient reports that he did take his insulin, but he did  eat a large barbeque dinner before he came to the clinic.  He continues  under the care of his primary care physician, and his insulin regimen has  been unchanged.  He has an appointment to see his primary care doctor next  week.  EXTREMITIES:  Examination of the lower extremity following removal of the  cast shows that the wound is near healed, 99%.  There is a persistence of 2+  edema with chronic changes of stasis.   IMPRESSION:  1.  Improved ulceration.  2.  Hyperglycemia.   PLAN:  We will place the patient in a Profore wrap and a Cam walker to  offload the foot for an additional two weeks.  In the interim, we have  instructed the patient to make an appointment with his primary care  physician for better control of his diabetes.  The patient seems to  understand.  We will see him at the end of two weeks, and we will refer him  for custom shoes and inserts at that time.           ______________________________  Theresia Majors Tanda Rockers, M.D.     Adam Ray  D:  11/13/2005  T:  11/14/2005  Job:  [id number]

## 2010-11-16 NOTE — Consult Note (Signed)
NAME:  Adam Ray, Adam Ray                  ACCOUNT NO.:  [id number]   MEDICAL RECORD NO.:  [id number]          PATIENT TYPE:  OUT   LOCATION:  XRAY                         FACILITY:  [facility]   PHYSICIAN:  Jonelle Sports. Sevier, M.D. DATE OF BIRTH:  [birth year]   DATE OF CONSULTATION:  02/05/2005  DATE OF DISCHARGE:                                   CONSULTATION   HISTORY:  This 54 year old black male is seen at the referral of Dr. Waynette Buttery  for assistance with management of a diabetic ulcer of the right foot with  secondary cellulitis.   The patient has had type 2 diabetes apparently for a number of years and is  currently on 70/30 insulin twice daily. Degree of his control was uncertain  (he says he thinks it is reasonably good) but he did have an extremely high  blood sugar level in excess of 400 several nights ago, which might have been  in association with his current infection and antibiotics but it is simply  not known. He also has a history of smoking one pack of cigarettes every two  days. He has had history of infection on the right foot but cannot remember  details and specifically does not remember plantar ulceration connected with  that.   Underlying all this, the patient says that he has some type of bone disease  which is unknown to him but which is characterized by no pain, deformity  or other easy fractures or other problems but by tendency for his bones to  get infected any time he has an infection anywhere in the body.   In addition, he does have symptoms to suggest significant diabetic  neuropathy in his feet with neuropathic discomfort and dysesthesias.   For that background history, the patient noted for the past several months  the accumulation of callus in the area of the first MP joint on the right.  About a week ago, this began to be associated with a little bloody drainage  from the plantar aspect and he noted also swelling and warmth and increasing  pain in his foot. This  led him to the [facility] on  February 02, 2005 at which point he was begun on Augmentin XR 2 grams b.i.d. He  was seen there again on the day prior to my consultation and at that time  was thought to look considerably better and his urgent referral here was  established for today.   PAST MEDICAL HISTORY:  Really is essentially as indicated above. He does  have history of eye surgery. His tobacco use is one pack every two days. He  says that he drinks Vodka one or two times a month but denies this has been  a problem. His weight has been stable. He has no known medicinal allergies  and his regular medications currently include his 70/30 mixed insulin,  ibuprofen 1000 milligrams per day0 and the Augmentin presently of 2 grams  b.i.d. to run for an additional 11 days.   PHYSICAL EXAMINATION:  EXTREMITIES:  Examination today is limited to the  distal lower extremities. The left leg is free of edema and the pulses there  are palpable. There is no evidence of callus formation or any skin break or  other cutaneous problems on the left.   The right lower extremity is characterized by boggy edema, relatively mild  but which extends to approximately midway up the calf area. This is in  association with warmth and erythema which involved the skin to the distal  two-thirds of the dorsal foot and the distal third of the plantar aspect of  the foot. There is no fluctuation.   Monofilament testing shows that he lacks protective sensation throughout  both feet.   His pulses are palpable through this edema but our study by handheld Doppler  nonetheless and indeed showed biphasic signals at both the dorsalis pedis  and posterior tibial sites on the right.   At the area of the first MP joint there is heavy callus formation both  laterally and on the plantar aspect of the foot and there is a small linear  opening at the plantar aspect. It is noted that some of the skin of this  callus  appears to be somewhat loose.   IMPRESSION:  Diabetic ulcer right foot with cellulitis and with some concern  about possibility of osteomyelitis.   DISPOSITION:  1.  The patient's callus is debrided away its loose portions and this allows      greater access to the area of defissurization and drainage in the base      of the wound. This is further full-thickness debrided and results      eventually in an open ulcer measuring 0.9 x 0.3 x 0.15 cm. It does not      appear to probe to bone or joint.  2.  The wound is dressed with an application of Bactroban and a bulky      protective dressing. The patient is placed in a Darco platform walker      and instructed in its proper use.  3.  He is advised to continue his Augmentin XR 2 grams b.i.d. until all      doses are complete which will be 11 days hence.  4.  He is advised to minimize his time on his feet and to elevate his foot      as high as his need when he is off his feet. He is given a note for his      employer in hopes that the situation can be established at work at least      until the patient is seen here one week hence, and if this is not      possible, the employer is asked to give him medical leave for that same      period of time. (In this pain, it is stressed upon the patient and his      wife that getting this injury to heal at this stage is extremely      important if potential catastrophic outcomes are to be avoided).  5.  The patient is set for x-ray of the area to exclude any deep undrained      pockets of purulence and/or any involvement of bone or joint.  6.  The patient's wife is advised to change his dressing at home daily, to      wash the area gently with warm mild soapy water, rinse it well, pat it      dry, then apply Neosporin, and again  a bulky dressing. Again, having the      patient remain full-time in his platform walker.  Follow-up visit will be to this clinic in one week.       RES/MEDQ  D:   02/05/2005  T:  02/06/2005  Job:  [id number]   cc:   Viviann Spare A. Waynette Buttery, M.D.  [facility] - Resident - [place], [place] [address]  Fax: [phone]   Jimmie Molly, M.D.  Fax: [phone]

## 2010-11-16 NOTE — Discharge Summary (Signed)
Adam Ray, Adam Ray                  ACCOUNT NO.:  [id number]   MEDICAL RECORD NO.:  [id number]          PATIENT TYPE:  INP   LOCATION:  3031                         FACILITY:  [facility]   PHYSICIAN:  Duncan Dull, M.D.     DATE OF BIRTH:  [birth year]   DATE OF ADMISSION:  04/25/2005  DATE OF DISCHARGE:  05/01/2005                                 DISCHARGE SUMMARY   DISCHARGE DIAGNOSES:  1.  Right metatarsophalangeal joint cellulitis.  2.  Right fourth toe osteomyelitis.  3.  Type 2 diabetes.   DISCHARGE MEDICATIONS:  1.  Ciprofloxacin 500 mg p.o. q.12h. for six weeks.  2.  Metronidazole 500 mg p.o. q.8h. for six weeks.  3.  Glucophage 500 mg p.o. q.12h.  4.  Glucotrol 5 mg p.o. q.12h.  5.  NovoLog 70/30, 30 units in the morning and 25 units at night.   PROCEDURES PERFORMED:  An MRI was obtained of the right lower extremity  which showed findings suspicious for osteomyelitis in the middle and distal  phalanges of the fourth digit.  Extensive forefoot cellulitis was identified  with an ill-defined fluid collection in the dorsal subcutaneous fat.  There  was a possible superficial abscess medial to the head of the first  metatarsal associated with overlying skin ulcer.   CONSULTATIONS:  Consultation was obtained from wound care and from Dr. Charlann Boxer  from [facility].   HISTORY OF PRESENT ILLNESS:  Mr. Schorr is a  55 year old African American  male with a past medical history significant for type 2 diabetes and  previous cellulitis of his right lower extremity, and there was concern for  osteomyelitis at his last hospitalization in August of 2006.  He presents  with a two-day history of increasing pain, swelling, and erythema of his  right lower extremity.  He had some drainage from the fourth toe but no  drainage over the medial aspect of his MP joint.  He also reported feeling  like his sugar was elevated, with increased urination, increased thirst, and  blurry vision.   ADMISSION LABORATORY DATA:  Sodium 130, potassium 4.0, BUN 9, creatinine  0.9, blood sugar 512.  White blood count 7.6, hemoglobin 13.2, platelets  222.  His UA was negative for nitrites and leukocyte esterase.  His  hemoglobin A1C was 13.5.  Please see admission history and physical for  further details.   HOSPITAL COURSE:  Problem 1.  Right lower extremity cellulitis overlying the  MP joint.  The patient was started on empiric broad-spectrum antibiotics of  vancomycin and Zosyn.  The patient responded, with decreasing erythema,  swelling, and pain in the lower extremity.  An orthopedic consult was  obtained from Dr. Charlann Boxer who felt that our current management of the  cellulitis was appropriate.  He felt that there was no need for an I&D of  the area.  The day after the consult was obtained, the area spontaneously  ruptured, and a culture was obtained from the purulent discharge.  Culture  results initially showed gram-negative rods and gram-positive rods, , likely  coag negative Staph  but were still pending speciation at time time of  dictation.  After 5 days of IV antiobiotics, patient was requesting  discharge and had been afebrile with excellent resolution of abscess on  exam.  As there was no plan for biopsy of the 4th metatarsal per  Orthopedics, patient was discharged home on 6 weeks of Cipro and Flagyl  (pending culture results) and will follow up with Ortho and IM in 1 week.   Problem 2.  Fourth toe osteomyelitis in the right lower extremity.  Dr. Charlann Boxer  had no plans for surgical intervention; therefore the plan for treatment  included 6 weeks of antiobiotics, as outlined above.  Problem 3.  Diabetes.  The patient was placed on his home regimen of insulin  70/30.  Due to the infection, the patient had increased blood glucose.  The  insulin 70/30 was increased, and he was also restarted on Glucophage and  Glucotrol which he had been on previously three years ago.  The patient   responded well.  His urinary symptoms decreased, and his vision improved.  Blood sugars were ranging from 80 to 120 at the time of discharge.   DISCHARGE LABORATORY DATA:  A BMET was obtained on April 28, 2005 which  showed a sodium of 137, potassium 3.9, chloride 105, bicarb 27, BUN 13,  creatinine 1.0, glucose 122, calcium 9.0.  A CBC obtained on April 30, 2005 showed a white blood count of 5.3, hemoglobin 12.1, and platelet count  of 257.  Culture results showed moderate gram-negative rods and moderate  gram-positive rods.  No speciation or sensitivities were back at the time of  discharge.      Zetta Bills, MD      Duncan Dull, M.D.  Electronically Signed    JP/MEDQ  D:  05/01/2005  T:  05/02/2005  Job:  [id number]   cc:   Madlyn Frankel Charlann Boxer, M.D.  Fax: [phone]

## 2010-11-16 NOTE — Assessment & Plan Note (Signed)
[facility]   NAME:  MARKISE, HAYMER NO.:  [id number]   MEDICAL RECORD NO.:  [id number]      DATE OF BIRTH:  25-Mar-1956   PHYSICIAN:  Theresia Majors. Tanda Rockers, M.D. VISIT DATE:  12/09/2005                                     OFFICE VISIT   OBJECTIVE:  Mr. Mcclenny returns for followup of his right medial ulceration of  the foot. The patient's wound was declared having been resolved on Nov 27, 2005. He returns for a followup. His concern is the status of his disability  claim with his local employer and the current insurance carrier. In the  interim, he has not had drainage. He has not had recurrent ulceration.   OBJECTIVE:  VITAL SIGNS:  Stable. He is afebrile.  EXTREMITIES:  Inspection of his lower extremity shows that there is a  persistence of 2+ edema bilaterally. The patient is not wearing his  recommended 30 to 40 mm below the knee support hose.   ASSESSMENT:  Resolved wound.   PLAN:  We have encouraged the patient to procure and wear the bilateral  below the knee 30 to 40 mm support hose. With regard to his insurance, we  have reassured the patient that we will make available, his medical record  and recommendations for occupation modification upon request. He has signed  a medical release for this information and we will cooperate with his  insurance company and with him to the best of our ability. We will see the  patient on a p.r.n. basis.           ______________________________  Theresia Majors Tanda Rockers, M.D.     Cephus Slater  D:  12/09/2005  T:  12/09/2005  Job:  [id number]

## 2010-11-19 ENCOUNTER — Encounter: Payer: Self-pay | Admitting: Family Medicine

## 2010-11-19 DIAGNOSIS — Z0000 Encounter for general adult medical examination without abnormal findings: Secondary | ICD-10-CM | POA: Insufficient documentation

## 2010-11-19 NOTE — Assessment & Plan Note (Signed)
Reviewed diet and exercise recommendations.  Put in labs for follow up of DM, HTN, HL.  Will f/u in one month for DM check

## 2010-11-19 NOTE — Progress Notes (Signed)
  Subjective:    Patient ID: Adam Ray, male    DOB: Mar 10, 1956, 55 y.o.   MRN: [id number]  HPI Presents for well exam.  No concerns today.  Denies new sexual partners, denies alcohol use, denies drug use.  No changes to PMH or social hx.  Living with his wife who is present today.    Currently smoking and does not want to discuss quitting  Review of Systems Denies CP, SOB, any low blood sugars    Objective:   Physical Exam     Vital signs reviewed General appearance - alert, well appearing, and in no distress and oriented to person, place, and time Heart - normal rate, regular rhythm, normal S1, S2, no murmurs, rubs, clicks or gallops Chest - clear to auscultation, no wheezes, rales or rhonchi, symmetric air entry, no tachypnea, retractions or cyanosis Abdomen - soft, nontender, nondistended, no masses or organomegaly Extremities - peripheral pulses normal, trace edema, no clubbing or cyanosis    Assessment & Plan:

## 2010-11-22 ENCOUNTER — Telehealth: Payer: Self-pay | Admitting: Family Medicine

## 2010-11-22 NOTE — Telephone Encounter (Signed)
Left message on patient vm, stated that I would send message to MD but from now on he would need to call his pharmacy to request refills on meds.

## 2010-11-22 NOTE — Telephone Encounter (Signed)
Patient called about getting a refill on his lantus, explained to pt we need to get a refill request from the pharmacy, pt says "no you dont, I always get a year supply and call [facility] to have them send it" Can someone call pt to educate, thanks! [facility]

## 2010-11-23 ENCOUNTER — Observation Stay (HOSPITAL_COMMUNITY)
Admission: EM | Admit: 2010-11-23 | Discharge: 2010-11-24 | Disposition: A | Discharge status: Home / Self Care | Payer: Medicare Other | Attending: Family Medicine | Admitting: Family Medicine

## 2010-11-23 ENCOUNTER — Emergency Department (HOSPITAL_COMMUNITY): Payer: Medicare Other

## 2010-11-23 DIAGNOSIS — E119 Type 2 diabetes mellitus without complications: Secondary | ICD-10-CM | POA: Insufficient documentation

## 2010-11-23 DIAGNOSIS — J449 Chronic obstructive pulmonary disease, unspecified: Secondary | ICD-10-CM | POA: Insufficient documentation

## 2010-11-23 DIAGNOSIS — D573 Sickle-cell trait: Secondary | ICD-10-CM | POA: Insufficient documentation

## 2010-11-23 DIAGNOSIS — J4489 Other specified chronic obstructive pulmonary disease: Secondary | ICD-10-CM | POA: Insufficient documentation

## 2010-11-23 DIAGNOSIS — I10 Essential (primary) hypertension: Secondary | ICD-10-CM | POA: Insufficient documentation

## 2010-11-23 DIAGNOSIS — Z91199 Patient's noncompliance with other medical treatment and regimen due to unspecified reason: Secondary | ICD-10-CM | POA: Insufficient documentation

## 2010-11-23 DIAGNOSIS — Z794 Long term (current) use of insulin: Secondary | ICD-10-CM | POA: Insufficient documentation

## 2010-11-23 DIAGNOSIS — R0789 Other chest pain: Principal | ICD-10-CM | POA: Insufficient documentation

## 2010-11-23 DIAGNOSIS — Z9119 Patient's noncompliance with other medical treatment and regimen: Secondary | ICD-10-CM | POA: Insufficient documentation

## 2010-11-23 DIAGNOSIS — F17200 Nicotine dependence, unspecified, uncomplicated: Secondary | ICD-10-CM | POA: Insufficient documentation

## 2010-11-23 DIAGNOSIS — R911 Solitary pulmonary nodule: Secondary | ICD-10-CM | POA: Insufficient documentation

## 2010-11-24 ENCOUNTER — Encounter: Payer: Self-pay | Admitting: Family Medicine

## 2010-11-24 ENCOUNTER — Emergency Department (HOSPITAL_COMMUNITY): Payer: Medicare Other

## 2010-11-24 DIAGNOSIS — E119 Type 2 diabetes mellitus without complications: Secondary | ICD-10-CM

## 2010-11-24 DIAGNOSIS — R0789 Other chest pain: Secondary | ICD-10-CM

## 2010-11-24 DIAGNOSIS — F17200 Nicotine dependence, unspecified, uncomplicated: Secondary | ICD-10-CM

## 2010-11-24 LAB — CBC
Hemoglobin: 13.8 g/dL (ref 13.0–17.0)
MCH: 29.7 pg (ref 26.0–34.0)
MCHC: 35 g/dL (ref 30.0–36.0)
MCV: 84.9 fL (ref 78.0–100.0)
Platelets: 132 10*3/uL — ABNORMAL LOW (ref 150–400)

## 2010-11-24 LAB — GLUCOSE, CAPILLARY
Glucose-Capillary: 269 mg/dL — ABNORMAL HIGH (ref 70–99)
Glucose-Capillary: 268 mg/dL — ABNORMAL HIGH (ref 70–99)

## 2010-11-24 LAB — URINALYSIS, ROUTINE W REFLEX MICROSCOPIC
Glucose, UA: >1000 mg/dL — AB
Ketones, ur: NEGATIVE mg/dL
Leukocytes, UA: NEGATIVE
Nitrite: NEGATIVE
Protein, ur: NEGATIVE mg/dL
Urobilinogen, UA: 1 mg/dL (ref 0.0–1.0)

## 2010-11-24 LAB — BASIC METABOLIC PANEL
CO2: 24 mEq/L (ref 19–32)
Chloride: 98 mEq/L (ref 96–112)
Creatinine, Ser: 1.1 mg/dL (ref 0.4–1.5)
GFR calc Af Amer: >60 mL/min (ref >60)
Potassium: 4.1 mEq/L (ref 3.5–5.1)

## 2010-11-24 LAB — DIFFERENTIAL
Basophils Relative: 1 % (ref 0–1)
Eosinophils Absolute: 0.3 10*3/uL (ref 0.0–0.7)
Lymphs Abs: 1.9 10*3/uL (ref 0.7–4.0)
Monocytes Absolute: 0.4 10*3/uL (ref 0.1–1.0)
Monocytes Relative: 10 % (ref 3–12)

## 2010-11-24 LAB — TROPONIN I: Troponin I: <0.3 ng/mL (ref <0.30)

## 2010-11-24 LAB — CK TOTAL AND CKMB (NOT AT ARMC)
CK, MB: 4.4 ng/mL — ABNORMAL HIGH (ref 0.3–4.0)
CK, MB: 4.7 ng/mL — ABNORMAL HIGH (ref 0.3–4.0)
Relative Index: 1.4 (ref 0.0–2.5)
Total CK: 324 U/L — ABNORMAL HIGH (ref 7–232)
Total CK: 349 U/L — ABNORMAL HIGH (ref 7–232)
Total CK: 337 U/L — ABNORMAL HIGH (ref 7–232)

## 2010-11-24 LAB — LIPID PANEL
LDL Cholesterol: 91 mg/dL (ref 0–99)
Total CHOL/HDL Ratio: 4.5 RATIO
VLDL: 54 mg/dL — ABNORMAL HIGH (ref 0–40)

## 2010-11-24 LAB — URINE MICROSCOPIC-ADD ON

## 2010-11-24 NOTE — Progress Notes (Deleted)
  Subjective:    Patient ID: Adam Ray, male    DOB: 1956/03/18, 55 y.o.   MRN: [id number]  HPI    Review of Systems     Objective:   Physical Exam        Assessment & Plan:
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
## 2010-11-25 ENCOUNTER — Other Ambulatory Visit: Payer: Self-pay | Admitting: Family Medicine

## 2010-11-25 MED ORDER — INSULIN GLARGINE 100 UNIT/ML ~~LOC~~ SOLN: SUBCUTANEOUS | Status: DC

## 2010-11-25 NOTE — Telephone Encounter (Signed)
refilled 

## 2010-11-29 ENCOUNTER — Other Ambulatory Visit (INDEPENDENT_AMBULATORY_CARE_PROVIDER_SITE_OTHER): Payer: Medicare Other

## 2010-11-29 DIAGNOSIS — I10 Essential (primary) hypertension: Secondary | ICD-10-CM

## 2010-11-29 DIAGNOSIS — E1139 Type 2 diabetes mellitus with other diabetic ophthalmic complication: Secondary | ICD-10-CM

## 2010-11-29 LAB — LIPID PANEL: Triglycerides: 586 mg/dL — ABNORMAL HIGH (ref <150)

## 2010-11-29 LAB — COMPREHENSIVE METABOLIC PANEL
ALT: 20 U/L (ref 0–53)
AST: 16 U/L (ref 0–37)
Alkaline Phosphatase: 133 U/L — ABNORMAL HIGH (ref 39–117)
BUN: 18 mg/dL (ref 6–23)
Creat: 1.18 mg/dL (ref 0.40–1.50)
Total Bilirubin: 0.4 mg/dL (ref 0.3–1.2)

## 2010-11-29 LAB — CBC
HCT: 41.1 % (ref 39.0–52.0)
Hemoglobin: 14.4 g/dL (ref 13.0–17.0)
MCHC: 35 g/dL (ref 30.0–36.0)
MCV: 85.1 fL (ref 78.0–100.0)
RDW: 13.8 % (ref 11.5–15.5)

## 2010-11-29 LAB — POCT GLYCOSYLATED HEMOGLOBIN (HGB A1C): Hemoglobin A1C: 9.5

## 2010-11-29 NOTE — Progress Notes (Signed)
FLP,CMP CBC AND HGBA1C DONE TODAY Joven Mom

## 2010-11-29 NOTE — Progress Notes (Signed)
DREW CMP, FLP, CBC, A1C BAJORDAN, MLS

## 2010-12-02 NOTE — H&P (Signed)
NAME:  Adam Ray, Adam Ray                  ACCOUNT NO.:  [id number]  MEDICAL RECORD NO.:  [id number]           PATIENT TYPE:  O  LOCATION:  3732                         FACILITY:  [facility]  PHYSICIAN:  Janece Laidlaw A. Sheffield Slider, M.D.    DATE OF BIRTH:  01-12-1956  DATE OF ADMISSION:  11/23/2010 DATE OF DISCHARGE:                             HISTORY & PHYSICAL   PRIMARY CARE PROVIDER:  Ellery Plunk, MD, [facility].  CHIEF COMPLAINT:  Chest pain.  HISTORY OF PRESENT ILLNESS:  Adam Ray is a 55 year old male presenting with chest pain.  Pain is under his right rib.  He feels like someone is stabbing his ribs from the front to the back.  Pain comes and goes and lasts few seconds.  Has been going on for the past few weeks, but may be getting worse.  Concern about cancer.  It does not radiate.  It sometimes worsens by lying on right side, but lying on right side does not consistently bring about pain.  Not brought on by exertion or associated with activity or with meals.  Denies dyspnea, nausea, vomiting, diarrhea, last bowel movement yesterday was normal, fevers,. No fevers, headaches, or vision changes.  Saw cardiologist at [facility] about 10 years ago and being cleared for surgery.  Told he had a "extra heart beat."  Never had a stress test.  In the ED, he received full-dose aspirin x1.  Unsure whether this helps her chest pain.  Chest pain is resolved now.  Regarding diabetes, sugars on the patient's glucometer that he brought with him ranges from 170-460 in the past few months.  He checked sugars every several days.  PAST MEDICAL HISTORY: 1. Type 2 diabetes. 2. Sickle cell trait. 3. Erectile dysfunction. 4. Chronic obstructive pulmonary disease.  Had previously been on     Advair, but no longer taking. 5. Benign hypertension. 6. Venous insufficiency. 7. Allergic rhinitis. 8. Left hydrocele. 9. Bicuspid aortic valve. 10.Leg edema.  PAST SURGICAL HISTORY: 1.  Removed full right great toe secondary to "bone disease," denies     infection in the toe. 2. 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  SOCIAL HISTORY:  Lives with wife in [place].  Smokes one-third pack per day.  Has smoked for about 50 years.  Drinks 1 shot of vodka about every other month.  Denies other drugs.  FAMILY HISTORY:  Father died from heart attack in his 20s.  Mother died from stroke in late 60s/early 77s.  ALLERGIES:  No known drug allergies.  MEDICATIONS: 1. Aspirin 81 mg p.o. daily. 2. Lasix 20 mg p.o. nightly for lower extremity edema. 3. Lantus 100 units nightly. 4. Quinapril 2.5 mg p.o. nightly. 5. Viagra 100 mg p.o. p.r.n. erectile dysfunction, last used 3 months     ago.  REVIEW OF SYSTEMS:  Per HPI with following additions.  Denies dysuria.  PHYSICAL EXAM:  VITAL SIGNS:  Pulse 65, blood pressure 129/76, respiratory rate 20, O2 sats 98% on room air, temperature 98.6. GENERAL:  Not apparent distress. HEENT:  Neck supple with no thyromegaly. HEART:  Regular rate and rhythm, S4 auscultated, no murmurs, no chest tenderness to palpation. LUNGS:  No increased work of breathing, clear to auscultation bilaterally with no wheezes, rales, or rhonchi, good effort and aeration. ABDOMEN:  Normoactive bowel sounds, soft, nontender. EXTREMITIES:  1 to 2+ pitting pretibial edema bilaterally, 1 to 2+ pedal pulses, cool, sensation intact, missing right great toe, no pedal lesions. NEUROLOGIC:  Fully alert and oriented and appropriate to questions.  LABORATORIES AND IMAGING:  BMET:  Sodium 133, potassium 4.1, chloride 98, bicarb 24, BUN 18, creatinine 1.10, glucose 442.  CBC:  White blood count 4.5, hemoglobin 13.8, platelets 132.  Chest x-ray with mild left basilar atelectasis and scattered tiny nodular density in the right lung base that raises a question for granulomatous disease since the prior study.  CT of the chest without  contrast shows minimal scarring at the lung apices and lungs otherwise essentially clear, 2-3 mm nodule noted in the right upper lung lobe.  Right renal cyst.  Diffuse degenerative change along the lower cervical and upper thoracic spine with sclerosis involving vertebral bodies C6, C7, T1, and endplate irregularity with significant endplate sclerotic changes at T8 through T9.  Echo in February 2011 showed normal systolic function with ejection fraction 60- 65% and bicuspid aortic valve mildly thickened with mildly calcified leaflets and trivial regurgitation.  ECG, heart rate 65, normal sinus rhythm, left axis deviation, PO2 of 100, QRS 82, QTc 428.  Compared with 2009 EKG, new T-wave inversion and V1 with questionable ST elevation with questionable 1 mm ST elevation in leads V2 and V3.  Cardiac enzymes, CK 349, then 324, CK-MB 4.6 and 4.4, troponin less than 0.30 x2.  ASSESSMENT AND PLAN:  Adam Ray is a 55 year old male presenting with atypical chest pain. 1. Chest pain concerning for acute coronary syndrome.  The patient     received dose of aspirin in the emergency department.  We will give     morphine 1-2 mg q.4 h. p.r.n. chest pain.  We will not start beta     blocker at this time because this is part of acute coronary     syndrome protocol as heart rate is in the 60s and blood pressure is     in the 120s over 70s.  We will give oxygen as needed.  Satting well     on room air currently.  We will give nitroglycerin p.r.n. chest     pain.  He has no chest pain at this time.  The patient's current     work of concerning for elevated CK/CK-MB, although troponins are     negative x2.  CK/CK-MB is trending down, however.  Unclear why this     is elevated.  No history of falls or complaints of myalgias.  We     will check another set of cardiac enzymes now.  Previous one was     just 4 hours ago.  We will get repeat ECG now.  We will risk     stratify with hemoglobin A1c, TSH, and the  fasting lipid panel. 2. Type 2 diabetes.  The patient did not take last night Lantus 100     units, but did receive 10 units of NovoLog in the emergency     department for elevated blood sugars in the 400s.  The patient did     not have a metabolic anion gap.  We will give 50 units of Lantus  morning.  We will plan to set aside p.o. insulin with mealtime     coverage.  We will continue home Accupril 2.5 mg p.o. nightly.     Most recent blood sugar checked on the patient's glucometer was     313.  Last hemoglobin A1c 9.9 in March 2012.  Since due for another     check.  Calcium, we will check now as a part of risk stratification     for acute coronary syndrome. 3. Hypertension.  Blood pressure good currently.  We will give home     Accupril and Lasix nightly. 4. Hyponatremia.  Likely secondary to elevated glucose.  We will give     normal saline.  We will repeat BMET in the a.m. 5. Pulmonary nodule on CT.  We will notify primary care physician     about followup CT in 1 year secondary to the patient's risk factors     of tobacco abuse. 6. Tobacco abuse.  We will provide counseling.  No nicotine patches at     this time. 7. Thrombocytopenia.  The patient's platelets are usually in the one     teens to 130s. 8. S1 auscultated on physical exam.  The patient says he has had an     extra heart beat for several years.  I do not feel the patient     needs a repeat echo at this time.  The patient may benefit from a     Cardiology followup as an outpatient. 9. Fluids, electrolytes, nutrition/gastrointestinal.  We will give     normal saline at 125 mL per hour.  Carbohydrate modified diet. 10.Prophylaxis.  Heparin subcutaneously. 11.Disposition:  Pending negative cardiac workup for acute coronary     syndrome.    ______________________________ Priscella Mann, MD   ______________________________ Arnette Norris Sheffield Slider, M.D.    AO/MEDQ  D:  11/24/2010  T:  11/24/2010  Job:   [id number]  Electronically Signed by Priscella Mann MD on 11/28/2010 12:24:25 PM Electronically Signed by Zachery Dauer M.D. on 12/02/2010 06:15:13 PM

## 2010-12-02 NOTE — Discharge Summary (Signed)
NAME:  Adam Ray, Adam Ray                  ACCOUNT NO.:  [id number]  MEDICAL RECORD NO.:  [id number]           PATIENT TYPE:  O  LOCATION:  3732                         FACILITY:  [facility]  PHYSICIAN:  Denice Cardon A. Sheffield Slider, M.D.    DATE OF BIRTH:  [birth year]  DATE OF ADMISSION:  11/23/2010 DATE OF DISCHARGE:  11/24/2010                              DISCHARGE SUMMARY   PRIMARY CARE PROVIDER:  Ellery Plunk, MD, at [facility].  REASON FOR HOSPITALIZATION:  Chest pain, concern for acute coronary syndrome.  DISCHARGE DIAGNOSES: 1. Right-sided rib chest pain, acute coronary syndrome ruled out. 2. Poorly controlled type 2 diabetes, noncompliant with insulin. 3. Tobacco abuse. 4. Small right upper lobe pulmonary nodule.  MEDICATIONS:  New medication, nitroglycerin sublingual 0.4 mg sublingually q.5 minutes x3 p.r.n. chest pain.  Continue home medications: 1. Accupril 2.5 mg p.o. daily. 2. Aspirin 81 mg p.o. daily 3. Furosemide 20 mg p.o. b.i.d. 4. Lantus 100 units subcutaneously q.a.m. 5. Sildenafil 100 mg p.o. daily p.r.n. erectile dysfunction.  CONSULTS:  None.  PROCEDURES:  Chest x-ray showed mild left basilar atelectasis with scattered tiny nodular densities in the right lung base that raises the question for granulomatous disease since the prior study.  A CT of the chest showed minimal scarring at the lung apices with the lungs otherwise being clear.  A 2- to 3-mm nodule in the right upper lung lobe.  Right renal cyst.  Diffuse degenerative change along the lower cervical and upper thoracic spine with sclerosis involving vertebral bodies of C6, C7, and T1, and plain irregularity was significant and plain sclerotic change at T8-T9.  PERTINENT LABORATORY DATA:  Troponins negative x3.  CK-MB and CK mildly elevated x3 with normal relative indices x3.  BRIEF HOSPITAL COURSE:  This is a 55 year old male with history of poorly controlled type 2 diabetes presenting with  right-sided chest pain. 1. Right-sided chest pain., acute coronary syndrome ruled out.  The     patient was admitted to floor bed with telemetry for observation.     No events on telemetry.  CK and CK-MB were elevated, but the     relative indices were normal x3 and troponins were negative x3.     ECG on admission was concerning for possible 1-mm ST elevation in     leads V2 and V3 that was not present on his previous ECG in 2009.     However, repeat ECG several hours later was unchanged, and we     thought that STEMI was unlikely based on his presentation in the     upsloping nature of these ST-waves.  The patient's right-sided     chest pain was resolved at the time of discharge.  Unclear etiology     for this pain is not reproducible, intermittent, and lasts only a     few seconds.  The patient insisted on leaving before noon and     threatened to leave AMA if not discharged at that time.  ACS was     ruled out, but the patient was asked to promptly follow up  with his     PCP for a referral for a stress test since he is at high risk for     cardiac disease from his poorly controlled diabetes and tobacco     use. 2. Type 2 diabetes, poorly controlled.  When asked about his insulin     regimen, the patient gave different answers to different providers.     According to records on EPIC, he is supposed to take 50 units of Lantus     in the a.m. and 100 units in the p.m.  His hemoglobin A1c in March     2012 was 9.1 and deteriorated from previous hemoglobin A1c of 7.6.     He presented with glucose in the 400s in the ED.  He was given 10     units of NovoLog in the ED and 50 units of Lantus in the a.m. and     kept on moderate sliding scale insulin regimen with mealtime     coverage. 3. Tobacco abuse.  The patient was counseled.  Denied nicotine patch. 4. Right upper lobe pulmonary nodule found on CT.  A 2- to 3-mm     nodule.  The patient will need repeat CT in 1 year to reassess      nodule since he is at high risk for bronchogenic carcinoma     secondary to tobacco abuse.  DISPOSITION:  The patient was discharged to home in stable medical condition.  DISCHARGE INSTRUCTIONS: 1. Call PCP for appointment on Nov 27, 2010, when the clinic reopens     in order to schedule hospital followup. 2. If chest pain worsens, if he gets short of breath, or chest pain is     alleviated by nitroglycerin, return to the ED.  FOLLOWUP ISSUES: 1. Recommend PCP.  Refer the patient for outpatient stress test to     rule out angina. 2. At PCP followup, please clarify the patient's insulin regimen.  He     is unclear how much Lantus he is supposed to be taking, told he was     taking for 50-100 units at nighttime and nothing in the morning.     He also says he was unfamiliar with metformin which is on his     medication reconciliation.    ______________________________ Priscella Mann, MD   ______________________________ Arnette Norris Sheffield Slider, M.D.    AO/MEDQ  D:  11/26/2010  T:  11/26/2010  Job:  [id number]  cc:   Ellery Plunk, MD  Electronically Signed by Priscella Mann MD on 11/28/2010 12:24:46 PM Electronically Signed by Zachery Dauer M.D. on 12/02/2010 04:54:09 PM

## 2010-12-12 ENCOUNTER — Ambulatory Visit (INDEPENDENT_AMBULATORY_CARE_PROVIDER_SITE_OTHER): Payer: Medicare Other | Admitting: Family Medicine

## 2010-12-12 ENCOUNTER — Encounter: Payer: Self-pay | Admitting: Family Medicine

## 2010-12-12 DIAGNOSIS — R0789 Other chest pain: Secondary | ICD-10-CM

## 2010-12-12 DIAGNOSIS — E1139 Type 2 diabetes mellitus with other diabetic ophthalmic complication: Secondary | ICD-10-CM

## 2010-12-12 DIAGNOSIS — E119 Type 2 diabetes mellitus without complications: Secondary | ICD-10-CM

## 2010-12-12 NOTE — Patient Instructions (Signed)
Start taking 60units of lantus in the AM and 100 units at night Take your blood sugar in AM before eating every day for a week. Call me after a week to tell me how they are  We can adjust your insulin from there  We will call with the cardiology information

## 2010-12-13 NOTE — Assessment & Plan Note (Signed)
R/o in hospital.  Send to cards as suggested in hospital d/c for c/s and stress test

## 2010-12-13 NOTE — Progress Notes (Signed)
  Subjective:     Adam Ray is a 55 y.o. male who presents for follow up of diabetes.. Current symptoms include: chest pain.  he was r/o in the hospital and now he is here for hospital follow up.. Patient denies nausea, vomiting and weight loss. Evaluation to date has been: fasting blood sugar, fasting lipid panel, hemoglobin A1C and microalbuminuria. Home sugars: BGs range between 170 and 400, BGs are high in the morning. Current treatments: Increased dose of insulin which has been somewhat effective.   The following portions of the patient's history were reviewed and updated as appropriate: allergies, current medications, past family history, past medical history, past social history, past surgical history and problem list.  Review of Systems Pertinent items are noted in HPI.   Pt has had no further CP since hospitalization.     Objective:    BP 108/69  Pulse 69  Temp(Src) 98.1 F (36.7 C) (Oral)  Wt 171 lb (77.565 kg) General appearance: alert, cooperative, appears stated age and no distress Head: Normocephalic, without obvious abnormality, atraumatic Lungs: clear to auscultation bilaterally and normal percussion bilaterally Heart: regular rate and rhythm, S1, S2 normal and systolic murmur: systolic ejection 2/6, blowing at 2nd left intercostal space Abdomen: soft, non-tender; bowel sounds normal; no masses,  no organomegaly Skin: Skin color, texture, turgor normal. No rashes or lesions    @DMFOOTEXAM @  Patient was evaluated for proper footwear and sizing.  Laboratory:  Lab Results  Component Value Date   HGBA1C 9.5 11/29/2010     Assessment:    Diabetes mellitus Type II, under fair control.    Plan:    Discussed general issues about diabetes pathophysiology and management. Encouraged aerobic exercise. Discussed foot care. Increased dose of insulin: 100 at night 60 in AM.  Pt to call with fasting CBGs for one week and titrate AM lantus.  . Follow up in 1 month or as  needed.

## 2010-12-13 NOTE — Assessment & Plan Note (Signed)
Increase lantus in AM to 60.  Record 7 days of AM fasting CBGs and titrate lantus by phone.

## 2011-01-03 ENCOUNTER — Ambulatory Visit: Payer: Medicare Other | Admitting: Family Medicine

## 2011-01-15 ENCOUNTER — Ambulatory Visit (INDEPENDENT_AMBULATORY_CARE_PROVIDER_SITE_OTHER): Payer: Medicare Other | Admitting: Cardiovascular Disease

## 2011-01-15 ENCOUNTER — Encounter: Payer: Self-pay | Admitting: Cardiovascular Disease

## 2011-01-15 ENCOUNTER — Encounter: Payer: Self-pay | Admitting: *Deleted

## 2011-01-15 DIAGNOSIS — E1139 Type 2 diabetes mellitus with other diabetic ophthalmic complication: Secondary | ICD-10-CM

## 2011-01-15 DIAGNOSIS — F17200 Nicotine dependence, unspecified, uncomplicated: Secondary | ICD-10-CM

## 2011-01-15 DIAGNOSIS — R0789 Other chest pain: Secondary | ICD-10-CM

## 2011-01-15 DIAGNOSIS — I10 Essential (primary) hypertension: Secondary | ICD-10-CM

## 2011-01-15 DIAGNOSIS — R079 Chest pain, unspecified: Secondary | ICD-10-CM

## 2011-01-15 NOTE — Assessment & Plan Note (Signed)
Longstanding IDDM, abnormal ECG and smoking  F/U stress myovue

## 2011-01-15 NOTE — Assessment & Plan Note (Signed)
Target A1c 6.5 or less.  Continue F/U with eye doctor and refer to podiatrist

## 2011-01-15 NOTE — Assessment & Plan Note (Signed)
Discussed length issues with vascular disease and DM with smoking.  Refer [facility] smoking cessation. He wont take chantix due to side effects

## 2011-01-15 NOTE — Patient Instructions (Signed)
Your physician has requested that you have en exercise stress myoview. For further information please visit [email or web address]. Please follow instruction sheet, as given.   

## 2011-01-15 NOTE — Progress Notes (Signed)
55 yo referred by [facility] practice Dr Sheffield Slider for SSCP and multiple CRF;s.  Long standing IDDM, still smoking, HTN and elevated lipids. Reviewed hospital records from 5/12.  R/O no acute ECG changes. Outpatient stress test recommended.  Pain was somewhat atypical and right sided.  Not recurred. Counseled for less than 10 minutes on cessation.  Last quit a month ago but only lasted a week.  Not interested in Chantix.  Referred to [facility] program.  Has infection in right foot and seeing primary for this.  Encouraged him to see podiatrist.  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  No previous stress test.    ROS: Denies fever, malais, weight loss, blurry vision, decreased visual acuity, cough, sputum, SOB, hemoptysis, pleuritic pain, palpitaitons, heartburn, abdominal pain, melena, lower extremity edema, claudication, or rash.  All other systems reviewed and negative   General: Affect appropriate Healthy:  appears stated age HEENT: normal Neck supple with no adenopathy JVP normal no bruits no thyromegaly Lungs clear with no wheezing and good diaphragmatic motion Heart:  S1/S2 no murmur,rub, gallop or click PMI normal Abdomen: benighn, BS positve, no tenderness, no AAA no bruit.  No HSM or HJR Distal pulses intact with no bruits Plus 2 RLE  edema Neuro non-focal Skin warm and dry No muscular weakness  Medications Current Outpatient Prescriptions  Medication Sig Dispense Refill  . aspirin 81 MG tablet Take 81 mg by mouth daily.        . metFORMIN (GLUCOPHAGE) 1000 MG tablet Take 1,000 mg by mouth 2 (two) times daily with meals.        . quinapril (ACCUPRIL) 5 MG tablet Take 2.5 mg by mouth daily.        . furosemide (LASIX) 20 MG tablet Take 20 mg by mouth as needed. May take up to 2 per day       . glucose blood (TRUETRACK TEST) test strip 1 each by Other route 2 (two) times daily. Use as instructed       . insulin glargine (LANTUS) 100 UNIT/ML injection Take  50 units in AM and 100 units in PM. Dispense 1 month supply as well as needles and syringes. Take 50 units in AM and 100 units in PM. Dispense 1 month supply as well as needles and syringes.  10 mL  11  . sildenafil (VIAGRA) 100 MG tablet Take 1 tablet (100 mg total) by mouth as needed for erectile dysfunction. Take one tablet one hour before sexual relations  10 tablet  6    Allergies Review of patient's allergies indicates no known allergies.  Family History: No family history on file.  Social History: History   Social History  . Marital Status: Married    Spouse Name: N/A    Number of Children: N/A  . Years of Education: N/A   Occupational History  . Not on file.   Social History Main Topics  . 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 Smokeless tobacco: Current User   Comment: wants to use electric cigarettes.  not interested in pills, worried about chantix side effects  . Alcohol Use: No  . Drug Use: No  . Sexually Active: Yes    Birth Control/ Protection: Post-menopausal   Other Topics Concern  . Not on file   Social History Narrative  . No narrative on file    Electrocardiogram:  NSR 75 LAD LVH poor R  wave progression  Assessment and Plan

## 2011-01-15 NOTE — Assessment & Plan Note (Signed)
Well controlled.  Continue current medications and low sodium Dash type diet.    

## 2011-01-16 ENCOUNTER — Ambulatory Visit (INDEPENDENT_AMBULATORY_CARE_PROVIDER_SITE_OTHER): Payer: Medicare Other | Admitting: Family Medicine

## 2011-01-16 ENCOUNTER — Encounter: Payer: Self-pay | Admitting: Family Medicine

## 2011-01-16 DIAGNOSIS — E1139 Type 2 diabetes mellitus with other diabetic ophthalmic complication: Secondary | ICD-10-CM

## 2011-01-16 DIAGNOSIS — R609 Edema, unspecified: Secondary | ICD-10-CM

## 2011-01-16 DIAGNOSIS — L98499 Non-pressure chronic ulcer of skin of other sites with unspecified severity: Secondary | ICD-10-CM

## 2011-01-16 DIAGNOSIS — F17200 Nicotine dependence, unspecified, uncomplicated: Secondary | ICD-10-CM

## 2011-01-16 DIAGNOSIS — E1169 Type 2 diabetes mellitus with other specified complication: Secondary | ICD-10-CM

## 2011-01-16 DIAGNOSIS — E11621 Type 2 diabetes mellitus with foot ulcer: Secondary | ICD-10-CM

## 2011-01-16 DIAGNOSIS — L84 Corns and callosities: Secondary | ICD-10-CM

## 2011-01-17 ENCOUNTER — Other Ambulatory Visit: Payer: Self-pay | Admitting: Family Medicine

## 2011-01-17 NOTE — Telephone Encounter (Signed)
Refill request

## 2011-01-22 ENCOUNTER — Encounter: Payer: Self-pay | Admitting: Family Medicine

## 2011-01-22 ENCOUNTER — Other Ambulatory Visit: Payer: Self-pay | Admitting: Family Medicine

## 2011-01-22 ENCOUNTER — Ambulatory Visit (HOSPITAL_COMMUNITY): Payer: Medicare Other | Attending: Cardiovascular Disease | Admitting: Radiology

## 2011-01-22 ENCOUNTER — Encounter (HOSPITAL_COMMUNITY): Payer: Medicare Other | Admitting: Radiology

## 2011-01-22 DIAGNOSIS — Z8249 Family history of ischemic heart disease and other diseases of the circulatory system: Secondary | ICD-10-CM | POA: Insufficient documentation

## 2011-01-22 DIAGNOSIS — J4489 Other specified chronic obstructive pulmonary disease: Secondary | ICD-10-CM | POA: Insufficient documentation

## 2011-01-22 DIAGNOSIS — F17200 Nicotine dependence, unspecified, uncomplicated: Secondary | ICD-10-CM | POA: Insufficient documentation

## 2011-01-22 DIAGNOSIS — E119 Type 2 diabetes mellitus without complications: Secondary | ICD-10-CM | POA: Insufficient documentation

## 2011-01-22 DIAGNOSIS — L84 Corns and callosities: Secondary | ICD-10-CM | POA: Insufficient documentation

## 2011-01-22 DIAGNOSIS — E11621 Type 2 diabetes mellitus with foot ulcer: Secondary | ICD-10-CM | POA: Insufficient documentation

## 2011-01-22 DIAGNOSIS — Z794 Long term (current) use of insulin: Secondary | ICD-10-CM | POA: Insufficient documentation

## 2011-01-22 DIAGNOSIS — I10 Essential (primary) hypertension: Secondary | ICD-10-CM | POA: Insufficient documentation

## 2011-01-22 DIAGNOSIS — J449 Chronic obstructive pulmonary disease, unspecified: Secondary | ICD-10-CM | POA: Insufficient documentation

## 2011-01-22 DIAGNOSIS — R079 Chest pain, unspecified: Secondary | ICD-10-CM | POA: Insufficient documentation

## 2011-01-22 MED ORDER — REGADENOSON 0.4 MG/5ML IV SOLN
0.4000 mg | Freq: Once | INTRAVENOUS | Status: AC
  Administered 2011-01-22: 0.4 mg via INTRAVENOUS

## 2011-01-22 MED ORDER — TECHNETIUM TC 99M TETROFOSMIN IV KIT
11.0000 | PACK | Freq: Once | INTRAVENOUS | Status: AC | PRN
  Administered 2011-01-22: 11 via INTRAVENOUS

## 2011-01-22 MED ORDER — TECHNETIUM TC 99M TETROFOSMIN IV KIT
33.0000 | PACK | Freq: Once | INTRAVENOUS | Status: AC | PRN
  Administered 2011-01-22: 33 via INTRAVENOUS

## 2011-01-22 NOTE — Assessment & Plan Note (Signed)
Worse today.  Discussed PRN lasix, quit smoking, elevate leg.

## 2011-01-22 NOTE — Assessment & Plan Note (Signed)
Discussed with pt today.  Not ready to quit

## 2011-01-22 NOTE — Telephone Encounter (Signed)
Refill request

## 2011-01-22 NOTE — Assessment & Plan Note (Signed)
Poor control.  Pt does not want to change insulin, occasionally checks CBGS 20s-400s.   Lab Results  Component Value Date   HGBA1C 9.5 11/29/2010   Due for a1c in august.  Will keep discussing tighter control with pt.

## 2011-01-22 NOTE — Progress Notes (Signed)
  Subjective:    Patient ID: Adam Ray, male    DOB: 07-Jun-1956, 55 y.o.   MRN: [id number]  HPI  Pt p/w ulcer of 3rd toe x 1 wk.  On ventral side of foot at the first joint.  No fevers, no pus.  Some bleeding originally.  Pt with poor foot wear, trying to get diabetic shoes.    Pt also has hx of great toe removal, PVD.  Today right leg with significant edema.  Some skin changes of PVD.  Pt has lasix but does not take.  Still smoking 1 ppd, not interested in quitting.    DM- taking insulin, rarely checks CBGs, not watching carb intake.  Denies polyuria, polydipsia.    Review of Systems Denies CP, SOB, HA, N/V/D, fever     Objective:   Physical Exam Vital signs reviewed General appearance - alert, well appearing, and in no distress and oriented to person, place, and time Heart - normal rate, regular rhythm, normal S1, S2,grade 3 murmur LUSB Chest - clear to auscultation, no wheezes, rales or rhonchi, symmetric air entry, no tachypnea, retractions or cyanosis Abdomen - soft, nontender, nondistended, no masses or organomegaly Right foot- great toe surgically absent, 3rd toe with ulcer with some crusting on it, so surrounding erythema, no drainage, pink tissue. Large corn on right sole.  After pairing punctate ulcer.  Offloaded with felt in u-shape       Assessment & Plan:

## 2011-01-22 NOTE — Progress Notes (Signed)
[facility] SITE 3 NUCLEAR MED [address] [place] [address] [phone]  Cardiology Nuclear Med Study  Adam Ray is a 55 y.o. male [id number] 11-04-1955   Nuclear Med Background Indication for Stress Test:  Evaluation for Ischemia History:  COPD and '11 Echo:EF=60-65% Cardiac Risk Factors: Family History - CAD, Hypertension, IDDM Type 2, Lipids and Smoker  Symptoms:  Chest Pain (last episode of chest discomfort was about one month ago)   Nuclear Pre-Procedure Caffeine/Decaff Intake:  None NPO After: 10:00pm   Lungs:  Clear.  O2 Sat 98% on RA. IV 0.9% NS with Angio Cath:  20g  IV Site: R Antecubital  IV Started by:  Bonnita Levan, RN  Chest Size (in):  42 Cup Size: n/a  Height: 5\' 11"  (1.803 m)  Weight:  168 lb (76.204 kg)  BMI:  Body mass index is 23.43 kg/(m^2). Tech Comments:  BS @ 12 pm = 117    Nuclear Med Study 1 or 2 day study: 1 day  Stress Test Type:  Lexiscan  Reading MD: Charlton Haws, MD  Order Authorizing Provider:  Charlton Haws, MD  Resting Radionuclide: Technetium 68m Tetrofosmin  Resting Radionuclide Dose: 11.0 mCi   Stress Radionuclide:  Technetium 22m Tetrofosmin  Stress Radionuclide Dose: 33.0 mCi           Stress Protocol Rest HR: 66 Stress HR: 88  Rest BP: 101/74 Stress BP: 113/69  Exercise Time (min): n/a METS: n/a   Predicted Max HR: 165 bpm % Max HR: 53.33 bpm Rate Pressure Product: 9944   Dose of Adenosine (mg):  n/a Dose of Lexiscan: 0.4 mg  Dose of Atropine (mg): n/a Dose of Dobutamine: n/a mcg/kg/min (at max HR)  Stress Test Technologist: Smiley Houseman, CMA-N  Nuclear Technologist:  Domenic Polite, CNMT     Rest Procedure:  Myocardial perfusion imaging was performed at rest 45 minutes following the intravenous administration of Technetium 40m Tetrofosmin.  Rest ECG: Prior ASWMI.  Stress Procedure:  The patient received IV Lexiscan 0.4 mg over 15-seconds.  Technetium 66m Tetrofosmin injected at 30-seconds.   There were nonspecific T-wave changes with Lexiscan.  Quantitative spect images were obtained after a 45 minute delay.  Stress ECG: No significant change from baseline ECG  QPS Raw Data Images:  Normal; no motion artifact; normal heart/lung ratio. Stress Images:  Normal homogeneous uptake in all areas of the myocardium. Rest Images:  Normal homogeneous uptake in all areas of the myocardium. Subtraction (SDS):  Normal Transient Ischemic Dilatation (Normal <1.22):  1.05 Lung/Heart Ratio (Normal <0.45):  0.36  Quantitative Gated Spect Images QGS EDV:  112 ml QGS ESV:  50 ml QGS cine images:  NL LV Function; NL Wall Motion QGS EF: 55%  Impression Exercise Capacity:  Lexiscan with no exercise. BP Response:  Normal blood pressure response. Clinical Symptoms:  Mild chest pain/dyspnea. ECG Impression:  No significant ST segment change suggestive of ischemia. Comparison with Prior Nuclear Study: No previous nuclear study performed  Overall Impression:  Normal stress nuclear study.      Charlton Haws

## 2011-01-22 NOTE — Assessment & Plan Note (Signed)
Used 15 blade to pare this.  Uncovered a tiny ulcer.  Offloaded with u-shaped felt.

## 2011-01-22 NOTE — Assessment & Plan Note (Signed)
precepted with Dr. McDiarmid.  Used restore to cover.  Not infected today.  See back in one week.

## 2011-01-23 ENCOUNTER — Encounter: Payer: Self-pay | Admitting: Family Medicine

## 2011-01-23 ENCOUNTER — Ambulatory Visit (INDEPENDENT_AMBULATORY_CARE_PROVIDER_SITE_OTHER): Payer: Medicare Other | Admitting: Family Medicine

## 2011-01-23 DIAGNOSIS — E11621 Type 2 diabetes mellitus with foot ulcer: Secondary | ICD-10-CM

## 2011-01-23 DIAGNOSIS — L98499 Non-pressure chronic ulcer of skin of other sites with unspecified severity: Secondary | ICD-10-CM

## 2011-01-23 DIAGNOSIS — L97509 Non-pressure chronic ulcer of other part of unspecified foot with unspecified severity: Secondary | ICD-10-CM

## 2011-01-23 DIAGNOSIS — E1169 Type 2 diabetes mellitus with other specified complication: Secondary | ICD-10-CM

## 2011-01-23 NOTE — Assessment & Plan Note (Signed)
Improved.  8mm.  Continue restore.  RTC in 1 week for recheck.

## 2011-01-23 NOTE — Progress Notes (Signed)
  Subjective:    Patient ID: Adam Ray, male    DOB: [birth year], 55 y.o.   MRN: [id number]  HPI  Using restore bandage QOD because he thinks it needs air.  When it gets air, it hardens up which he thinks is better.  No fevers, no pus.  Looks better to him  Review of Systems Denies CP, SOB, HA, N/V/D, fever     Objective:   Physical Exam    Vital signs reviewed General appearance - alert, well appearing, and in no distress and oriented to person, place, and time Toe- ulcer with pink healthy tissue on all edges, still with some white skin on top, no pus, 8mm in diameter    Assessment & Plan:

## 2011-01-23 NOTE — Progress Notes (Signed)
nuc med report routed to Dr.Nishan 01/23/11 Ayaz Sondgeroth  

## 2011-01-23 NOTE — Patient Instructions (Signed)
Keep using the bandage I gave you-  You want the white stuff on the ulcer to go away and the nice pink skin to grow in to the center  Come back in a week for recheck and more of the bandages  Your stress test (heart test) was fine!

## 2011-01-23 NOTE — Progress Notes (Signed)
pt aware of results Adam Ray  

## 2011-01-30 ENCOUNTER — Ambulatory Visit: Payer: Medicare Other | Admitting: Family Medicine

## 2011-02-06 ENCOUNTER — Encounter: Payer: Self-pay | Admitting: Family Medicine

## 2011-02-06 ENCOUNTER — Ambulatory Visit (INDEPENDENT_AMBULATORY_CARE_PROVIDER_SITE_OTHER): Payer: Medicare Other | Admitting: Family Medicine

## 2011-02-06 DIAGNOSIS — E11621 Type 2 diabetes mellitus with foot ulcer: Secondary | ICD-10-CM

## 2011-02-06 DIAGNOSIS — E1169 Type 2 diabetes mellitus with other specified complication: Secondary | ICD-10-CM

## 2011-02-06 DIAGNOSIS — L98499 Non-pressure chronic ulcer of skin of other sites with unspecified severity: Secondary | ICD-10-CM

## 2011-02-06 NOTE — Assessment & Plan Note (Signed)
Significantly improved.  Pt to call if worsens or another comes.  RTC in 1 month for DM f/u

## 2011-02-06 NOTE — Progress Notes (Signed)
  Subjective:    Patient ID: Adam Ray, male    DOB: [birth year], 55 y.o.   MRN: [id number]  HPI  Here for f/u of foot ulcer.  Improved greatly.  Using neosporin. Still waiting for diabetic shoes.    Review of Systems    Denies CP, SOB, HA, N/V/D, fever  Objective:   Physical Exam   Vital signs reviewed General appearance - alert, well appearing, and in no distress and oriented to person, place, and time 2nd toe on right foot ulcer completely closed, still a little red but no heat or fluctuance.     Assessment & Plan:

## 2011-03-08 ENCOUNTER — Inpatient Hospital Stay (INDEPENDENT_AMBULATORY_CARE_PROVIDER_SITE_OTHER)
Admission: RE | Admit: 2011-03-08 | Discharge: 2011-03-08 | Disposition: A | Discharge status: Home / Self Care | Payer: Medicare Other | Source: Ambulatory Visit | Attending: Family Medicine | Admitting: Family Medicine

## 2011-03-08 DIAGNOSIS — J40 Bronchitis, not specified as acute or chronic: Secondary | ICD-10-CM

## 2011-03-15 ENCOUNTER — Ambulatory Visit (INDEPENDENT_AMBULATORY_CARE_PROVIDER_SITE_OTHER): Payer: Medicare Other | Admitting: Family Medicine

## 2011-03-15 DIAGNOSIS — J209 Acute bronchitis, unspecified: Secondary | ICD-10-CM | POA: Insufficient documentation

## 2011-03-15 NOTE — Progress Notes (Signed)
Mr. Ignasiak notes a cold for 1-1/2 weeks. He notes a cough that is sometimes productive. He denies any fevers or chills or dyspnea or wheeze. He denies any stuffy nose itchy eyes or sneezing. He was seen in urgent care last week and prescribed amoxicillin. This has not helped his symptoms.   Exam:  BP 120/74  Pulse 80  Temp(Src) 97.9 F (36.6 C) (Oral)  Wt 166 lb (75.297 kg) Gen: Well NAD HEENT: EOMI,  MMM Lungs: CTABL Nl WOB Heart: RRR no MRG Abd: NABS, NT, ND Exts: Non edematous BL  LE, warm and well perfused.

## 2011-03-15 NOTE — Assessment & Plan Note (Signed)
I suspect Adam Ray has bronchitis. He was treated with amoxicillin which didn't help his symptoms.  I suspect this causes viral. Plan to treat with supportive care such as humidifier Tylenol and cessation of smoking. Red flag precautions provided. Handout given from up-to-date on patient information of bronchitis. Patient expresses understanding.

## 2011-03-21 LAB — CBC
HCT: 32.8 — ABNORMAL LOW
HCT: 34.5 — ABNORMAL LOW
HCT: 34.5 — ABNORMAL LOW
HCT: 39.3
Hemoglobin: 11.6 — ABNORMAL LOW
Hemoglobin: 11.7 — ABNORMAL LOW
Hemoglobin: 12.3 — ABNORMAL LOW
Hemoglobin: 13.2
MCHC: 33.9
MCHC: 33.6
MCHC: 33.6
MCHC: 33.7
MCHC: 33.8
MCV: 85.3
MCV: 86.1
MCV: 86.3
MCV: 86.6
Platelets: 257
Platelets: 190
Platelets: 217
RBC: 4.36
RBC: 3.98 — ABNORMAL LOW
RBC: 4.22
RDW: 13.5
RDW: 13.7
RDW: 13.7
WBC: 7.7
WBC: 4

## 2011-03-21 LAB — BASIC METABOLIC PANEL
BUN: 12
BUN: 15
BUN: 21
CO2: 31
CO2: 27
CO2: 28
CO2: 29
CO2: 31
Calcium: 9
Calcium: 8.6
Calcium: 8.7
Calcium: 8.9
Calcium: 8.9
Chloride: 104
Chloride: 101
Chloride: 103
Chloride: 105
Creatinine, Ser: 1.01
Creatinine, Ser: 1.08
Creatinine, Ser: 1.09
Creatinine, Ser: 1.26
GFR calc Af Amer: >60
GFR calc Af Amer: >60
GFR calc Af Amer: >60
GFR calc Af Amer: >60
GFR calc non Af Amer: >60
Glucose, Bld: 112 — ABNORMAL HIGH
Glucose, Bld: 170 — ABNORMAL HIGH
Glucose, Bld: 255 — ABNORMAL HIGH
Glucose, Bld: 329 — ABNORMAL HIGH
Potassium: 4.1
Potassium: 4.2
Potassium: 4.4
Potassium: 5
Sodium: 134 — ABNORMAL LOW
Sodium: 138
Sodium: 139

## 2011-03-21 LAB — POCT RAPID STREP A: Streptococcus, Group A Screen (Direct): NEGATIVE

## 2011-03-21 LAB — DIFFERENTIAL
Basophils Absolute: 0
Basophils Relative: 0
Eosinophils Absolute: 0.1
Neutro Abs: 5.3
Neutrophils Relative %: 69

## 2011-03-21 LAB — ANAEROBIC CULTURE

## 2011-03-21 LAB — WOUND CULTURE

## 2011-03-21 LAB — APTT: aPTT: 38 — ABNORMAL HIGH

## 2011-03-21 LAB — TISSUE CULTURE: Gram Stain: NONE SEEN

## 2011-03-21 LAB — PROTIME-INR
INR: 1
Prothrombin Time: 13.8

## 2011-03-28 ENCOUNTER — Encounter: Payer: Self-pay | Admitting: Family Medicine

## 2011-03-28 ENCOUNTER — Ambulatory Visit (INDEPENDENT_AMBULATORY_CARE_PROVIDER_SITE_OTHER): Payer: Medicare Other | Admitting: Family Medicine

## 2011-03-28 VITALS — BP 114/72 | HR 80 | Temp 98.4°F | Wt 163.0 lb

## 2011-03-28 DIAGNOSIS — E1139 Type 2 diabetes mellitus with other diabetic ophthalmic complication: Secondary | ICD-10-CM

## 2011-03-28 DIAGNOSIS — R05 Cough: Secondary | ICD-10-CM

## 2011-03-28 DIAGNOSIS — R059 Cough, unspecified: Secondary | ICD-10-CM

## 2011-03-28 MED ORDER — GUAIFENESIN ER 1200 MG PO TB12: 1.0000 | ORAL_TABLET | Freq: Two times a day (BID) | ORAL | Status: DC

## 2011-03-28 MED ORDER — ALBUTEROL SULFATE HFA 108 (90 BASE) MCG/ACT IN AERS: 2.0000 | INHALATION_SPRAY | Freq: Four times a day (QID) | RESPIRATORY_TRACT | Status: DC | PRN

## 2011-03-28 NOTE — Assessment & Plan Note (Signed)
No better with amox, continues to cough.  Considering crackles and long smoking hx, will check cxr.  Will maybe try azithro if CXR consistent with PNA.

## 2011-03-28 NOTE — Progress Notes (Signed)
  Subjective:    Patient ID: Adam Ray, male    DOB: 09-04-1955, 55 y.o.   MRN: [id number]  HPI  Here with 3 weeks of cough.  Treated 1 week ago with amoxicillin.  No better.  Feels like he can't cough up what is in his chest.  No fevers or body aches.  Taking 3 kinds of dayquil and 2 kinds of benedryl at once.  Smoker but is smoking less with this chest cold.  Review of Systems No N/V/D    Objective:   Physical Exam Vital signs reviewed General appearance - alert, well appearing, and in no distress and oriented to person, place, and time Heart - normal rate, regular rhythm, normal S1, S2, no murmurs, rubs, clicks or gallops Lungs- some crackles throughout lung fields, no wheeze       Assessment & Plan:

## 2011-03-28 NOTE — Patient Instructions (Signed)
I am sending you for a chest xray at [facility] I would like you to take mucinex 1200 twice a day Only take one of each TYPE of medication Come back and see me in a week to see how you are doing

## 2011-04-02 ENCOUNTER — Ambulatory Visit
Admission: RE | Admit: 2011-04-02 | Discharge: 2011-04-02 | Disposition: A | Discharge status: Home / Self Care | Payer: Medicare Other | Source: Ambulatory Visit | Attending: Family Medicine | Admitting: Family Medicine

## 2011-04-02 DIAGNOSIS — R059 Cough, unspecified: Secondary | ICD-10-CM

## 2011-04-02 DIAGNOSIS — R05 Cough: Secondary | ICD-10-CM

## 2011-04-03 ENCOUNTER — Encounter: Payer: Self-pay | Admitting: Family Medicine

## 2011-04-05 ENCOUNTER — Ambulatory Visit: Payer: Medicare Other | Admitting: Family Medicine

## 2011-04-08 ENCOUNTER — Encounter: Payer: Self-pay | Admitting: *Deleted

## 2011-04-08 NOTE — Telephone Encounter (Signed)
This encounter was created in error - please disregard.

## 2011-04-15 LAB — GC/CHLAMYDIA PROBE AMP, GENITAL: Chlamydia, DNA Probe: NEGATIVE

## 2011-04-24 ENCOUNTER — Ambulatory Visit (INDEPENDENT_AMBULATORY_CARE_PROVIDER_SITE_OTHER): Payer: Medicare Other | Admitting: Family Medicine

## 2011-04-24 ENCOUNTER — Encounter: Payer: Self-pay | Admitting: Family Medicine

## 2011-04-24 DIAGNOSIS — F17200 Nicotine dependence, unspecified, uncomplicated: Secondary | ICD-10-CM

## 2011-04-24 DIAGNOSIS — J209 Acute bronchitis, unspecified: Secondary | ICD-10-CM

## 2011-04-24 NOTE — Assessment & Plan Note (Signed)
Patient is feeling much better. No further treatment.

## 2011-04-24 NOTE — Assessment & Plan Note (Signed)
Told patient that electronic cigarettes are not FDA regulated. I am not able to recommend needs as they may have substances that he would not want inhale. I did tell him that tobacco definitely has substances he does not inhale. I told him that we were available to help him with tobacco cessation when you want to discuss it.

## 2011-04-24 NOTE — Progress Notes (Signed)
  Subjective:    Patient ID: Adam Ray, male    DOB: 1956/07/01, 55 y.o.   MRN: [id number]  HPI  Patient here for followup of bronchitis. He states he is today much better. He is almost no cough. Occasionally he has a dry cough. He is not taking any medication for this anymore.  Smoking. Patient wanted to discuss electronic cigarettes. He is not interested in Chantix, nicotine patches, or nicotine gum. He is not interested in seeing Dr. Raymondo Band for smoking cessation.  Review of Systems    Denies CP, SOB, HA, N/V/D, fever  Objective:   Physical Exam Vital signs reviewed General appearance - alert, well appearing, and in no distress and oriented to person, place, and time Heart - normal rate, regular rhythm, normal S1, S2, no murmurs, rubs, clicks or gallops Chest - clear to auscultation, no wheezes, rales or rhonchi, symmetric air entry, no tachypnea, retractions or cyanosis        Assessment & Plan:

## 2011-04-24 NOTE — Patient Instructions (Signed)
I will see you in December for diabetes followup. Please let us know if you want any help with smoking cessation.

## 2011-05-22 ENCOUNTER — Telehealth: Payer: Self-pay | Admitting: Family Medicine

## 2011-05-22 ENCOUNTER — Other Ambulatory Visit: Payer: Self-pay | Admitting: Family Medicine

## 2011-05-22 NOTE — Telephone Encounter (Signed)
Patient states he doesn't always take insulin and metformin in the morning , just when his BS is elevated. For past 3 days however he has been taking it twice daily . BS yesterday was 600 and today 580. Had him  check now and reading is 380 . States he feels fine. Can't think of anything he has done differently. No signs of infection noted he states. Consulted with Dr. Jennette Kettle and she advises for patient to take  extra 25 units Lantus now and tomorrow  mid day if  BS over 350. Advised to check BS more frequently.  Appointment scheduled for Monday to follow up elevated BS. Reviewed signs and symptoms of hypoglycemia.

## 2011-05-22 NOTE — Telephone Encounter (Signed)
Refill request

## 2011-05-22 NOTE — Telephone Encounter (Signed)
States that his BS is over 500 today and yesterday was 600.  Thinks his meds needs to be changed.

## 2011-05-27 ENCOUNTER — Ambulatory Visit: Payer: Medicare Other | Admitting: Family Medicine

## 2011-05-29 ENCOUNTER — Ambulatory Visit: Payer: Medicare Other | Admitting: Family Medicine

## 2011-06-11 ENCOUNTER — Ambulatory Visit (INDEPENDENT_AMBULATORY_CARE_PROVIDER_SITE_OTHER): Payer: Medicare Other | Admitting: Family Medicine

## 2011-06-11 ENCOUNTER — Encounter: Payer: Self-pay | Admitting: Family Medicine

## 2011-06-11 VITALS — BP 114/72 | HR 74 | Temp 98.2°F | Ht 67.5 in | Wt 170.0 lb

## 2011-06-11 DIAGNOSIS — Z23 Encounter for immunization: Secondary | ICD-10-CM

## 2011-06-11 DIAGNOSIS — J209 Acute bronchitis, unspecified: Secondary | ICD-10-CM

## 2011-06-11 MED ORDER — QUINAPRIL HCL 5 MG PO TABS: 2.5000 mg | ORAL_TABLET | Freq: Every day | ORAL | Status: DC

## 2011-06-11 MED ORDER — ALBUTEROL 90 MCG/ACT IN AERS: 2.0000 | INHALATION_SPRAY | RESPIRATORY_TRACT | Status: DC | PRN

## 2011-06-11 MED ORDER — FUROSEMIDE 20 MG PO TABS: 20.0000 mg | ORAL_TABLET | Freq: Every day | ORAL | Status: DC

## 2011-06-11 MED ORDER — METFORMIN HCL 1000 MG PO TABS: 1000.0000 mg | ORAL_TABLET | Freq: Two times a day (BID) | ORAL | Status: DC

## 2011-06-11 MED ORDER — AZITHROMYCIN 500 MG PO TABS: 500.0000 mg | ORAL_TABLET | Freq: Every day | ORAL | Status: AC

## 2011-06-11 MED ORDER — INSULIN GLARGINE 100 UNIT/ML ~~LOC~~ SOLN: SUBCUTANEOUS | Status: DC

## 2011-06-11 NOTE — Assessment & Plan Note (Signed)
Patient with likely bronchitis. Given long smoking history may indicate COPD. Since patient is diabetic and has been sick in the past, I will treat with azithromycin for this. I will give him albuterol inhaler to see if this helps her cough. Her potassium in 2 weeks for followup. He should benefit from PFTs. We discussed smoking cessation and patient is not at all interested.

## 2011-06-11 NOTE — Progress Notes (Signed)
  Subjective:    Patient ID: Adam Ray, male    DOB: [birth year], 55 y.o.   MRN: [id number]  HPI Patient here today for cough x2 weeks. This cough is worse at night. He says he has mucus production that is green. He is having trouble sleeping at night due to this cough. He denies any fever. Denies any daytime symptoms. He is still smoking at least one pack per day. he does not have any trouble breathing during the day. His cough seems to be getting worse and not better.   Review of Systems Denies CP, SOB, HA, N/V/D, fever     Objective:   Physical Exam Vital signs reviewed General appearance - alert, well appearing, and in no distress and oriented to person, place, and time Heart - normal rate, regular rhythm, normal S1, S2, no murmurs, rubs, clicks or gallops Chest - clear to auscultation, no wheezes, rales or rhonchi, symmetric air entry, no tachypnea, retractions or cyanosis        Assessment & Plan:

## 2011-06-11 NOTE — Patient Instructions (Addendum)
I have filled all of your prescriptions today to  take to whatever pharmacy you would like Please take the azithromycin for the bronchitis Use the albuterol to see if it will help with your cough at night Please come back and see me in 2 weeks for a recheck of your diabetes and other problems

## 2011-06-15 ENCOUNTER — Other Ambulatory Visit: Payer: Self-pay | Admitting: Family Medicine

## 2011-06-17 MED ORDER — SILDENAFIL CITRATE 100 MG PO TABS: 100.0000 mg | ORAL_TABLET | ORAL | Status: DC | PRN

## 2011-06-17 NOTE — Telephone Encounter (Signed)
Refill request

## 2011-06-18 ENCOUNTER — Other Ambulatory Visit: Payer: Self-pay | Admitting: Family Medicine

## 2011-06-18 NOTE — Telephone Encounter (Signed)
Refill request

## 2011-06-20 ENCOUNTER — Ambulatory Visit: Payer: Medicare Other | Admitting: Family Medicine

## 2011-07-23 ENCOUNTER — Telehealth: Payer: Self-pay | Admitting: Family Medicine

## 2011-07-23 NOTE — Telephone Encounter (Signed)
Would like the Rx for Viagra to read for 1 per week.

## 2011-07-23 NOTE — Telephone Encounter (Signed)
Left message on voicemail for patient to call back with more details about request.

## 2011-07-23 NOTE — Telephone Encounter (Signed)
Spoke with patient, states he can only afford one pill per week and would like new rx with instructions that state one pill weekly as needed. Will forward to MD.

## 2011-07-29 MED ORDER — SILDENAFIL CITRATE 100 MG PO TABS: 100.0000 mg | ORAL_TABLET | ORAL | Status: DC | PRN

## 2011-07-29 NOTE — Telephone Encounter (Signed)
Changed to 4 pills

## 2011-07-30 ENCOUNTER — Ambulatory Visit: Payer: Medicare Other | Admitting: Family Medicine

## 2011-07-30 NOTE — Telephone Encounter (Signed)
Patient informed. 

## 2011-08-07 ENCOUNTER — Ambulatory Visit: Payer: Medicare Other | Admitting: Family Medicine

## 2011-08-28 ENCOUNTER — Encounter (INDEPENDENT_AMBULATORY_CARE_PROVIDER_SITE_OTHER): Payer: Medicare Other | Admitting: Ophthalmology

## 2011-08-28 DIAGNOSIS — E11359 Type 2 diabetes mellitus with proliferative diabetic retinopathy without macular edema: Secondary | ICD-10-CM

## 2011-08-28 DIAGNOSIS — E11319 Type 2 diabetes mellitus with unspecified diabetic retinopathy without macular edema: Secondary | ICD-10-CM

## 2011-08-28 DIAGNOSIS — E1139 Type 2 diabetes mellitus with other diabetic ophthalmic complication: Secondary | ICD-10-CM

## 2011-08-28 DIAGNOSIS — H43819 Vitreous degeneration, unspecified eye: Secondary | ICD-10-CM

## 2011-08-28 DIAGNOSIS — E1165 Type 2 diabetes mellitus with hyperglycemia: Secondary | ICD-10-CM

## 2011-08-28 DIAGNOSIS — H3581 Retinal edema: Secondary | ICD-10-CM

## 2011-08-28 DIAGNOSIS — H2510 Age-related nuclear cataract, unspecified eye: Secondary | ICD-10-CM

## 2011-09-08 ENCOUNTER — Emergency Department (INDEPENDENT_AMBULATORY_CARE_PROVIDER_SITE_OTHER)
Admission: EM | Admit: 2011-09-08 | Discharge: 2011-09-08 | Disposition: A | Discharge status: Home / Self Care | Payer: Self-pay | Source: Home / Self Care | Attending: Emergency Medicine | Admitting: Emergency Medicine

## 2011-09-08 ENCOUNTER — Emergency Department (INDEPENDENT_AMBULATORY_CARE_PROVIDER_SITE_OTHER): Payer: Medicare Other

## 2011-09-08 ENCOUNTER — Encounter (HOSPITAL_COMMUNITY): Payer: Self-pay | Admitting: *Deleted

## 2011-09-08 DIAGNOSIS — R319 Hematuria, unspecified: Secondary | ICD-10-CM

## 2011-09-08 DIAGNOSIS — N390 Urinary tract infection, site not specified: Secondary | ICD-10-CM

## 2011-09-08 DIAGNOSIS — M549 Dorsalgia, unspecified: Secondary | ICD-10-CM

## 2011-09-08 HISTORY — DX: Bronchitis, not specified as acute or chronic: J40

## 2011-09-08 LAB — POCT I-STAT, CHEM 8
BUN: 11 mg/dL (ref 6–23)
Creatinine, Ser: 1.1 mg/dL (ref 0.50–1.35)
Potassium: 4.4 mEq/L (ref 3.5–5.1)
Sodium: 139 mEq/L (ref 135–145)

## 2011-09-08 LAB — POCT URINALYSIS DIP (DEVICE)
Bilirubin Urine: NEGATIVE
Glucose, UA: 500 mg/dL — AB
Ketones, ur: NEGATIVE mg/dL
Leukocytes, UA: NEGATIVE
pH: 5 (ref 5.0–8.0)

## 2011-09-08 MED ORDER — HYDROCODONE-ACETAMINOPHEN 5-325 MG PO TABS: 2.0000 | ORAL_TABLET | ORAL | Status: AC | PRN

## 2011-09-08 MED ORDER — METAXALONE 800 MG PO TABS: 800.0000 mg | ORAL_TABLET | Freq: Three times a day (TID) | ORAL | Status: AC

## 2011-09-08 MED ORDER — MELOXICAM 15 MG PO TABS: 15.0000 mg | ORAL_TABLET | Freq: Every day | ORAL | Status: DC

## 2011-09-08 NOTE — ED Provider Notes (Signed)
History     CSN: [id number]  Arrival date & time 09/08/11  1428   First MD Initiated Contact with Patient 09/08/11 1436      Chief Complaint  Patient presents with  . Back Pain  . Flank Pain    (Consider location/radiation/quality/duration/timing/severity/associated sxs/prior treatment) HPI Comments: Patient with sharp, constant, nonradiating, right posterior rib pain starting yesterday. Pain is worse with lateral bending, torso rotation no alleviating factors.. Is not related to exertion, deep inspiration. Denies any change in level of physical activity, or lifting heavy objects recently. No recent or remote history of trauma to his back or to this area. No coughing, wheezing, shortness of breath hemoptysis,. No nausea, vomiting, fevers. No urinary urgency, frequency, hematuria, dysuria. No abdominal distention, abdominal pain. Last bowel movement was yesterday, and was normal for patient No rash. No calf swelling, tenderness, prolonged immobilization, recent surgery, history of cancer. Patient has not tried anything for this.  ROS as noted in HPI. All other ROS negative.   The history is provided by the patient. No language interpreter was used.    Past Medical History  Diagnosis Date  . Diabetes mellitus   . COPD (chronic obstructive pulmonary disease)   . Allergy   . Chronic kidney disease     Past Surgical History  Procedure Date  . Surgery left great toe   . Tear ducts bilateral eyes     History reviewed. No pertinent family history.  History  Substance Use Topics  . Smoking status: Current Everyday Smoker -- 0.3 packs/day for 46 years    Types: Cigars  . Smokeless tobacco: Current User   Comment: wants to use electric cigarettes.  not interested in pills, worried about chantix side effects  . Alcohol Use: No      Review of Systems  Allergies  Review of patient's allergies indicates no known allergies.  Home Medications   Current Outpatient Rx  Name  Route Sig Dispense Refill  . ALBUTEROL 90 MCG/ACT IN AERS Inhalation Inhale 2 puffs into the lungs every 4 (four) hours as needed for wheezing (cough). 17 g 0  . ASPIRIN 81 MG PO TABS Oral Take 81 mg by mouth daily.      . FUROSEMIDE 20 MG PO TABS Oral Take 1 tablet (20 mg total) by mouth daily. May take up to 2 per day 30 tablet 11  . INSULIN GLARGINE 100 UNIT/ML [name]  100 UNITS IN PM. DISPENSE 1 MONTH SUPPLY AS WELL AS NEEDLES AND SYRINGES.    [name] METFORMIN HCL 1000 MG PO TABS Oral Take 1 tablet (1,000 mg total) by mouth 2 (two) times daily with a meal. 60 tablet 11  . QUINAPRIL HCL 5 MG PO TABS Oral Take 0.5 tablets (2.5 mg total) by mouth daily. 30 tablet 11  . ALBUTEROL SULFATE HFA 108 (90 BASE) MCG/ACT IN AERS Inhalation Inhale 2 puffs into the lungs every 6 (six) hours as needed for wheezing. 1 Inhaler 1    Dispense with spacer  . BD INSULIN SYR ULTRAFINE II 31G X 5/16" 0.5 ML MISC  USE AS DIRECTED 100 each 3    NEED MORE REFILLS PLEASE  . GLUCOSE BLOOD VI STRP Other 1 each by Other route 2 (two) times daily. Use as instructed     . SILDENAFIL CITRATE 100 MG PO TABS Oral Take 1 tablet (100 mg total) by mouth as needed for erectile dysfunction. 4 tablet 11    BP 122/76  Pulse 67  Temp(Src) 98.3  F (36.8 C) (Oral)  Resp 17  SpO2 96%  Physical Exam  Nursing note and vitals reviewed. Constitutional: He is oriented to person, place, and time. He appears well-developed and well-nourished.  HENT:  Head: Normocephalic and atraumatic.  Eyes: Conjunctivae and EOM are normal.  Neck: Normal range of motion.  Cardiovascular: Normal rate, regular rhythm, normal heart sounds and intact distal pulses.   Pulmonary/Chest: Effort normal. No respiratory distress. He has no wheezes. He has no rales. He exhibits tenderness.       Questionable crackles right lower lobe. Tenderness right lower ribs in the midaxillary line. Pain with lateral bending, and torso rotation. No rash, bruising.  Abdominal:  Soft. Bowel sounds are normal. He exhibits no distension and no mass. There is no tenderness. There is no rebound and no guarding.  Musculoskeletal: Normal range of motion. He exhibits no edema.       Back:       Calves symmetric, nontender.  Neurological: He is alert and oriented to person, place, and time.  Skin: Skin is warm and dry. No rash noted.  Psychiatric: He has a normal mood and affect. His behavior is normal.    ED Course  Procedures (including critical care time)  Labs Reviewed - No data to display No results found.   No diagnosis found. Results for orders placed during the hospital encounter of 09/08/11  POCT I-STAT, CHEM 8      Component Value Range   Sodium 139  135 - 145 (mEq/L)   Potassium 4.4  3.5 - 5.1 (mEq/L)   Chloride 102  96 - 112 (mEq/L)   BUN 11  6 - 23 (mg/dL)   Creatinine, Ser 1.61  0.50 - 1.35 (mg/dL)   Glucose, Bld 096 (*) 70 - 99 (mg/dL)   Calcium, Ion 0.45  4.09 - 1.32 (mmol/L)   TCO2 28  0 - 100 (mmol/L)   Hemoglobin 16.0  13.0 - 17.0 (g/dL)   HCT 81.1  91.4 - 78.2 (%)  POCT URINALYSIS DIP (DEVICE)      Component Value Range   Glucose, UA 500 (*) NEGATIVE (mg/dL)   Bilirubin Urine NEGATIVE  NEGATIVE    Ketones, ur NEGATIVE  NEGATIVE (mg/dL)   Specific Gravity, Urine 1.015  1.005 - 1.030    Hgb urine dipstick SMALL (*) NEGATIVE    pH 5.0  5.0 - 8.0    Protein, ur NEGATIVE  NEGATIVE (mg/dL)   Urobilinogen, UA 0.2  0.0 - 1.0 (mg/dL)   Nitrite NEGATIVE  NEGATIVE    Leukocytes, UA NEGATIVE  NEGATIVE      MDM  Previous records, imaging, labs reviewed. Seen in [facility] and 06/03/2009 for right back and flank pain. That note is not available in the database.  patient had a CT abdomen pelvis on 06/03/2009 showed small bilateral nonobstructing kidney stones.  CT abdomen on 11/24/2010 showed Normal abdominal aorta, right renal cyst.     H&P most consistent with musculoskeletal etiology of his rib pain. No fevers, vital signs acceptable. Will  check UA to rule out UTI, nephritis, kidney stone. Will check chest x-ray to rule out pneumonia, as patient is a long-term smoker. No other chest pain, diaphoresis, and symptoms suggesting cardiac etiology of his pain. Calves Symmetric, nontender, no history of DVT or PE. Doubt PE. No rash suggestive of shingles at this time. Abdomen soft, nontender, last bowel movement was yesterday and was within normal limits for him. doubt intra-abdominal process. Checking i-STAT, as patient  with chronic kidney disease, to guide NSAID therapy.  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 Suspect pain may be from this. Patient states that he was diagnosed with a muscle spasm on the ER visit from 2010. Discussed lab results with patient, and concern for possible kidney stones. We'll have [facility] for reevaluation hematuria.    Domenick Gong, MD 09/08/11 2123

## 2011-09-08 NOTE — Discharge Instructions (Signed)
This appears to be a muscle spasm, however, it could be from very small kidney stones. You have a small amount of blood in your urine. You will need to have this rechecked in a week or so. Take the medication as written. Take 1 gram of tylenol up to 4 times a day as needed for pain and fever. This with the meloxicam is an effective combination for pain. Take the hydrocodone/norco only for severe pain. Do not take the tylenol and hydrocodone/norco they both have tylenol in them and too much can hurt your liver. Return if you get worse, have a  fever >100.4, or for any concerns.   Go to www.goodrx.com to look up your medications. This will give you a list of where you can find your prescriptions at the most affordable prices.

## 2011-09-08 NOTE — ED Notes (Signed)
Pt with c/o right sided back pain mid to upper back worse with movement onset of pain yesterday - denies increased pain with deep breath - denies urinary symptoms

## 2011-09-11 ENCOUNTER — Encounter: Payer: Self-pay | Admitting: Family Medicine

## 2011-09-11 ENCOUNTER — Ambulatory Visit (INDEPENDENT_AMBULATORY_CARE_PROVIDER_SITE_OTHER): Payer: Medicare Other | Admitting: Family Medicine

## 2011-09-11 ENCOUNTER — Encounter: Payer: Self-pay | Admitting: *Deleted

## 2011-09-11 VITALS — BP 121/79 | HR 57 | Temp 97.8°F | Ht 67.5 in | Wt 173.0 lb

## 2011-09-11 DIAGNOSIS — E1139 Type 2 diabetes mellitus with other diabetic ophthalmic complication: Secondary | ICD-10-CM

## 2011-09-11 DIAGNOSIS — M549 Dorsalgia, unspecified: Secondary | ICD-10-CM

## 2011-09-11 DIAGNOSIS — H579 Unspecified disorder of eye and adnexa: Secondary | ICD-10-CM

## 2011-09-11 DIAGNOSIS — F17200 Nicotine dependence, unspecified, uncomplicated: Secondary | ICD-10-CM

## 2011-09-11 LAB — POCT UA - MICROALBUMIN
Creatinine, POC: 200 mg/dL
Microalbumin Ur, POC: 10 mg/dL

## 2011-09-11 LAB — LDL CHOLESTEROL, DIRECT: Direct LDL: 78 mg/dL

## 2011-09-11 NOTE — Assessment & Plan Note (Signed)
Neck pain mostly resolved. Likely MSK. We'll recheck a urine today to followup hemoglobin in the urine.

## 2011-09-11 NOTE — Progress Notes (Signed)
  Subjective:    Patient ID: Adam Ray, male    DOB: [birth year], 56 y.o.   MRN: [id number]  HPI Followup right sided back pain. Taking Skelaxin, Norco, Mobic. Feeling much better. No trouble with urination. Has not noticed any blood in his urine. Denies any history of kidney stones. No nausea, vomiting, diarrhea. No fevers.  Diabetes-patient is still taking 50 units in each leg at night. He did not take his Lantus last night. He's been really resistant to any changes to his Lantus or diabetes regimen in the past. Today he states that he would be willing to talk to Dr. Raymondo Band about his diabetes.  Nodule on CT-patient had a nodule on a CT scan in May of 2012. He states he followed up in one year from that date. He denies any coughing, shortness of breath, chest pain. he denies hemoptysis.  Today we discussed his smoking. He has a pack of cigarettes in his pocket. He says he will be smoking until he is in the grave. Review of Systems See above    Objective:   Physical Exam  Vital signs reviewed General appearance - alert, well appearing, and in no distress and oriented to person, place, and time Abdomen - soft, nontender, nondistended, no masses or organomegaly Back- nontender along the spine, ribs. Patient pointed out the area where he was hurting and he is no longer tender there. Heart - normal rate, regular rhythm, normal S1, S2, no murmurs, rubs, clicks or gallops Chest - clear to auscultation, no wheezes, rales or rhonchi, symmetric air entry, no tachypnea, retractions or cyanosis       Assessment & Plan:

## 2011-09-11 NOTE — Patient Instructions (Signed)
Please make an appointment to see Dr. Raymondo Band for your diabetes In May we will send you for another CT chest  Please come back if there is any change in your pain

## 2011-09-11 NOTE — Progress Notes (Signed)
Addended by: Phebe Colla on: 09/11/2011 05:37 PM   Modules accepted: Orders

## 2011-09-11 NOTE — Assessment & Plan Note (Signed)
Patient precontemplation stage. Discussed the effects of tobacco use. Discussed his need for a CT scan in May.

## 2011-09-11 NOTE — Assessment & Plan Note (Signed)
Plan to have patient see Dr. Raymondo Band to help him work on his diabetes. I've asked him to bring in his meter to that appointment. He has not been very interested in working with me on this but I think having another opinion will help.

## 2011-09-12 LAB — POCT URINALYSIS DIPSTICK
Protein, UA: NEGATIVE
Spec Grav, UA: >=1.03
Urobilinogen, UA: 0.2
pH, UA: 5.5

## 2011-09-12 NOTE — Progress Notes (Signed)
Addended by: [place], Lucindia Lemley on: 09/12/2011 02:44 PM   Modules accepted: Orders

## 2011-10-28 ENCOUNTER — Ambulatory Visit (INDEPENDENT_AMBULATORY_CARE_PROVIDER_SITE_OTHER): Payer: Medicare Other | Admitting: Family Medicine

## 2011-10-28 ENCOUNTER — Encounter: Payer: Self-pay | Admitting: Family Medicine

## 2011-10-28 VITALS — BP 120/80 | HR 72 | Temp 98.9°F | Ht 67.5 in | Wt 169.0 lb

## 2011-10-28 DIAGNOSIS — J309 Allergic rhinitis, unspecified: Secondary | ICD-10-CM

## 2011-10-28 MED ORDER — FLUTICASONE PROPIONATE 50 MCG/ACT NA SUSP: 2.0000 | Freq: Every day | NASAL | Status: DC

## 2011-10-28 MED ORDER — CETIRIZINE HCL 10 MG PO TABS: 10.0000 mg | ORAL_TABLET | Freq: Every day | ORAL | Status: DC

## 2011-10-28 NOTE — Patient Instructions (Signed)
Allergic Rhinitis  Allergic rhinitis is when the mucous membranes in the nose respond to allergens. Allergens are particles in the air that cause your body to have an allergic reaction. This causes you to release allergic antibodies. Through a chain of events, these eventually cause you to release histamine into the blood stream (hence the use of antihistamines). Although meant to be protective to the body, it is this release that causes your discomfort, such as frequent sneezing, congestion and an itchy runny nose.    CAUSES    The pollen allergens may come from grasses, trees, and weeds. This is seasonal allergic rhinitis, or "hay fever." Other allergens cause year-round allergic rhinitis (perennial allergic rhinitis) such as house dust mite allergen, pet dander and mold spores.    SYMPTOMS     Nasal stuffiness (congestion).   Runny, itchy nose with sneezing and tearing of the eyes.   There is often an itching of the mouth, eyes and ears.  It cannot be cured, but it can be controlled with medications.  DIAGNOSIS    If you are unable to determine the offending allergen, skin or blood testing may find it.  TREATMENT     Avoid the allergen.   Medications and allergy shots (immunotherapy) can help.   Hay fever may often be treated with antihistamines in pill or nasal spray forms. Antihistamines block the effects of histamine. There are over-the-counter medicines that may help with nasal congestion and swelling around the eyes. Check with your caregiver before taking or giving this medicine.  If the treatment above does not work, there are many new medications your caregiver can prescribe. Stronger medications may be used if initial measures are ineffective. Desensitizing injections can be used if medications and avoidance fails. Desensitization is when a patient is given ongoing shots until the body becomes less sensitive to the allergen. Make sure you follow up with your caregiver if problems continue.  SEEK  MEDICAL CARE IF:     You develop fever (more than 100.5 F (38.1 C).   You develop a cough that does not stop easily (persistent).   You have shortness of breath.   You start wheezing.   Symptoms interfere with normal daily activities.  Document Released: 03/12/2001 Document Revised: 06/06/2011 Document Reviewed: 09/21/2008  [employer] Patient Information 2012 [employer]

## 2011-10-29 ENCOUNTER — Encounter: Payer: Self-pay | Admitting: Family Medicine

## 2011-10-29 NOTE — Progress Notes (Signed)
  Subjective:    Patient ID: Adam Ray, male    DOB: 14-Jun-1956, 56 y.o.   MRN: [id number]  HPI  Pt p/w 1 week of congestion, rhinorrhea, and cough as well as PND. No fevers, no SOB, no CP.  Hx of seasonal allergies.  Not taking anything now.  Denies icthy eyes but is sneezing.  Worse outside.  Review of Systems See above    Objective:   Physical Exam  Vital signs reviewed General appearance - alert, well appearing, and in no distress Heart - normal rate, regular rhythm, normal S1, S2, no murmurs, rubs, clicks or gallops Chest - clear to auscultation, no wheezes, rales or rhonchi, symmetric air entry, no tachypnea, retractions or cyanosis Eyes - no conjunctival injection Ears - bilateral TM's and external ear canals normal, right ear normal, left ear normal Nose - normal and patent, with mild thin clear discharge        Assessment & Plan:

## 2011-10-30 ENCOUNTER — Encounter: Payer: Self-pay | Admitting: Family Medicine

## 2011-10-30 NOTE — Assessment & Plan Note (Signed)
Likely allergic rhinitis with hx of same.  Gave zyrtec to start and rx for flonase if necessary.  To return if no better in 2-3 weeks

## 2011-11-05 ENCOUNTER — Emergency Department (INDEPENDENT_AMBULATORY_CARE_PROVIDER_SITE_OTHER)
Admission: EM | Admit: 2011-11-05 | Discharge: 2011-11-05 | Disposition: A | Discharge status: Home / Self Care | Payer: Medicare Other | Source: Home / Self Care | Attending: Emergency Medicine | Admitting: Emergency Medicine

## 2011-11-05 ENCOUNTER — Encounter (HOSPITAL_COMMUNITY): Payer: Self-pay | Admitting: *Deleted

## 2011-11-05 DIAGNOSIS — M7989 Other specified soft tissue disorders: Secondary | ICD-10-CM

## 2011-11-05 DIAGNOSIS — M25461 Effusion, right knee: Secondary | ICD-10-CM

## 2011-11-05 DIAGNOSIS — J40 Bronchitis, not specified as acute or chronic: Secondary | ICD-10-CM

## 2011-11-05 MED ORDER — AZITHROMYCIN 250 MG PO TABS: 250.0000 mg | ORAL_TABLET | Freq: Every day | ORAL | Status: DC

## 2011-11-05 MED ORDER — MELOXICAM 15 MG PO TABS: 15.0000 mg | ORAL_TABLET | Freq: Every day | ORAL | Status: DC

## 2011-11-05 MED ORDER — DEXAMETHASONE 4 MG PO TABS: 16.0000 mg | ORAL_TABLET | Freq: Two times a day (BID) | ORAL | Status: DC

## 2011-11-05 NOTE — Discharge Instructions (Signed)
Wear the knee sleeves as needed for comfort. Take the Mobic for inflammation and pain. Ice to knee 20 minutes at a time. Take 2 puffs from your albuterol inhaler every 4-6 hours, even if you're not wheezing. Do this for the next 2 or 3 days. You may decrease the frequency with which use the albuterol as your cough improves. Take the steroids unless you're Dr. tells you to stop. This will elevate your blood sugars significantly, so you need to check your blood sugar twice a day. Make sure you measure them before you eat breakfast, and then one hour after a meal. Make sure you it just your insulin dosing accordingly. you should followup with Dr. Hulen Luster within the next several days, for any medication adjustments. It is important that you get your diabetes under better control. Return if you've a fever above 100.4, if you get worse, or any other concerns.

## 2011-11-05 NOTE — ED Notes (Signed)
2 days of right knee pain.  Denies recent injury.   Also he c/o having  a cold the past 2 weeks, now with a non-productive cough without a fever

## 2011-11-05 NOTE — ED Provider Notes (Signed)
History     CSN: [id number]  Arrival date & time 11/05/11  1518   First MD Initiated Contact with Patient 11/05/11 1611      Chief Complaint  Patient presents with  . Knee Pain    (Consider location/radiation/quality/duration/timing/severity/associated sxs/prior treatment) HPI Comments: Patient presents with 2 complaints; first, patient reports dry, nonproductive cough at night for 2 weeks. There is no cough during the day. States he's had upper respiratory like symptoms with post nasal drip, rhinorrhea during this time No wheezing, chest pain, shortness of breath. No hemoptysis. No fevers, chest tightness, waterbrash, sore throat. States this feels identical to previous episodes of bronchitis. States he uses his albuterol once a day as written, but it has tried it once or twice at night without improvement. He has tried Mucinex as well. Patient has a history COPD, and is a long-term smoker.  Second, patient reports diffuse right knee pain, swelling starting 2 days ago. Does not recall any recent or remote history of trauma to the area. Does not recall any recent change in physical activity No redness, limitation of motion, fevers, deformity, paresthesias. Symptoms are worse with walking, and better with rest. He tried 400 mg of ibuprofen about an hour prior to arrival without improvement. Patient is diabetic, and states his sugars have been ranging in the 300s for the past 2 days, but this is "normal" for him. Denies abdominal pain, polyuria, polydipsia, unintentional weight loss.  ROS as noted in HPI. All other ROS negative.   Patient is a 56 y.o. male presenting with knee pain and cough. The history is provided by the patient. No language interpreter was used.  Knee Pain This is a new problem. The current episode started 2 days ago. The problem occurs constantly. The problem has not changed since onset.The symptoms are aggravated by walking. The symptoms are relieved by nothing.   Cough    Past Medical History  Diagnosis Date  . Diabetes mellitus   . COPD (chronic obstructive pulmonary disease)   . Allergy   . Chronic kidney disease   . Bronchitis   . Gout     Past Surgical History  Procedure Date  . Surgery left great toe   . Tear ducts bilateral eyes     Family History  Problem Relation Age of Onset  . Diabetes Mother   . Stroke Mother   . Heart failure Father     History  Substance Use Topics  . Smoking status: Current Everyday Smoker -- 0.3 packs/day for 46 years    Types: Cigars  . Smokeless tobacco: Current User   Comment: wants to use electric cigarettes.  not interested in pills, worried about chantix side effects  . Alcohol Use: No      Review of Systems  Respiratory: Positive for cough.     Allergies  Review of patient's allergies indicates no known allergies.  Home Medications   Current Outpatient Rx  Name Route Sig Dispense Refill  . ALBUTEROL SULFATE HFA 108 (90 BASE) MCG/ACT IN AERS Inhalation Inhale 2 puffs into the lungs every 6 (six) hours as needed for wheezing. 1 Inhaler 1    Dispense with spacer  . ALBUTEROL 90 MCG/ACT IN AERS Inhalation Inhale 2 puffs into the lungs every 4 (four) hours as needed for wheezing (cough). 17 g 0  . ASPIRIN 81 MG PO TABS Oral Take 81 mg by mouth daily.      . BD INSULIN SYR ULTRAFINE II 31G X 5/16" 0.5  ML MISC  USE AS DIRECTED 100 each 3    NEED MORE REFILLS PLEASE  . CETIRIZINE HCL 10 MG PO TABS Oral Take 1 tablet (10 mg total) by mouth daily. 30 tablet 11  . FUROSEMIDE 20 MG PO TABS Oral Take 1 tablet (20 mg total) by mouth daily. May take up to 2 per day 30 tablet 11  . GLUCOSE BLOOD VI STRP Other 1 each by Other route 2 (two) times daily. Use as instructed     . GUAIFENESIN ER 600 MG PO TB12 Oral Take 1,200 mg by mouth 2 (two) times daily.    . INSULIN GLARGINE 100 UNIT/ML [place] SOLN  100 UNITS IN PM. DISPENSE 1 MONTH SUPPLY AS WELL AS NEEDLES AND SYRINGES.    [name] METFORMIN HCL 1000  MG PO TABS Oral Take 1 tablet (1,000 mg total) by mouth 2 (two) times daily with a meal. 60 tablet 11  . QUINAPRIL HCL 5 MG PO TABS Oral Take 0.5 tablets (2.5 mg total) by mouth daily. 30 tablet 11  . SILDENAFIL CITRATE 100 MG PO TABS Oral Take 1 tablet (100 mg total) by mouth as needed for erectile dysfunction. 4 tablet 11  . AZITHROMYCIN 250 MG PO TABS Oral Take 1 tablet (250 mg total) by mouth daily. 2 tabs po on day one, then one tablet po once daily on days 2-5. 6 tablet 0  . DEXAMETHASONE 4 MG PO TABS Oral Take 4 tablets (16 mg total) by mouth 2 (two) times daily with a meal. 4 tabs po at once on day one, 4 tabs po at once on day 2 8 tablet 0  . FLUTICASONE PROPIONATE 50 MCG/ACT NA SUSP Nasal Place 2 sprays into the nose daily. 16 g 6  . MELOXICAM 15 MG PO TABS Oral Take 1 tablet (15 mg total) by mouth daily. 14 tablet 0    BP 126/70  Pulse 80  Temp(Src) 99.3 F (37.4 C) (Oral)  Resp 18  SpO2 97%  Physical Exam  Nursing note and vitals reviewed. Constitutional: He is oriented to person, place, and time. He appears well-developed and well-nourished.  HENT:  Head: Normocephalic and atraumatic.  Eyes: Conjunctivae and EOM are normal.  Neck: Normal range of motion.  Cardiovascular: Normal rate, regular rhythm, normal heart sounds and intact distal pulses.   Pulmonary/Chest: Effort normal and breath sounds normal. No respiratory distress. He has no wheezes. He exhibits no tenderness.  Abdominal: He exhibits no distension.  Musculoskeletal: Normal range of motion.       Right knee: He exhibits swelling.       Mild swelling right knee. No  effusion. Minimal tenderness. Patellar Crepitus with flexion/extension.  Knee ROM baseline for PT Flexion  intact , Patella NT, Patellar apprehension test negative, Patellar tendon NT, Medial joint NT, Lateral joint NT, Popliteal region NT, Lachman's stable, Varus stress testing stable, Valgus stress testing stable, McMurray's testing normal, distal NVI  with intact baseline sensation / motor / pulse distal to knee. No redness, increased temperature compared other knee.   Neurological: He is alert and oriented to person, place, and time.  Skin: Skin is warm and dry.  Psychiatric: He has a normal mood and affect. His behavior is normal.    ED Course  Procedures (including critical care time)  Labs Reviewed - No data to display No results found.   1. Knee swelling, right   2. Bronchitis       MDM  Cough is most likely a  postviral cough however, given his history COPD will treat empirically for bronchitis. increased albuterol, as he states he's taking only once a day, 2 days of steroids, and azithromycin as he has a history of COPD. As for his right knee,  No evidence of septic joint. Suspect arthritis flare. I sent him home with meloxicam recent BUN/creatinine from 08/2010 were within normal limits.   Patient is pleasant, although is a difficult historian.  appears to have very poor understanding of his diabetes and how to manage it. States his glucose has been "traveling" ranging in the 200s and 300s. He checks his sugar once a day. Discussed importance of take glycemic control. Advised him that the steroids will increase his sugars while taking it, so he needs to adjust his insulin accordingly. Will have him followup with his physician in several days for diabetic education and possible adjustment of his insulin, especially after the 2 days of the dexamethasone.  Cc Dr. Ellery Plunk, his primary care physician about today's visit  Luiz Blare, MD 11/05/11 1729

## 2011-11-11 ENCOUNTER — Emergency Department (HOSPITAL_COMMUNITY): Payer: Medicare Other

## 2011-11-11 ENCOUNTER — Encounter (HOSPITAL_COMMUNITY): Payer: Self-pay | Admitting: *Deleted

## 2011-11-11 ENCOUNTER — Emergency Department (HOSPITAL_COMMUNITY)
Admission: EM | Admit: 2011-11-11 | Discharge: 2011-11-11 | Disposition: A | Discharge status: Home / Self Care | Payer: Medicare Other | Attending: Emergency Medicine | Admitting: Emergency Medicine

## 2011-11-11 DIAGNOSIS — Z8639 Personal history of other endocrine, nutritional and metabolic disease: Secondary | ICD-10-CM | POA: Insufficient documentation

## 2011-11-11 DIAGNOSIS — S0990XA Unspecified injury of head, initial encounter: Secondary | ICD-10-CM | POA: Insufficient documentation

## 2011-11-11 DIAGNOSIS — S2239XA Fracture of one rib, unspecified side, initial encounter for closed fracture: Secondary | ICD-10-CM | POA: Insufficient documentation

## 2011-11-11 DIAGNOSIS — Z79899 Other long term (current) drug therapy: Secondary | ICD-10-CM | POA: Insufficient documentation

## 2011-11-11 DIAGNOSIS — J449 Chronic obstructive pulmonary disease, unspecified: Secondary | ICD-10-CM | POA: Insufficient documentation

## 2011-11-11 DIAGNOSIS — S0100XA Unspecified open wound of scalp, initial encounter: Secondary | ICD-10-CM | POA: Insufficient documentation

## 2011-11-11 DIAGNOSIS — J4489 Other specified chronic obstructive pulmonary disease: Secondary | ICD-10-CM | POA: Insufficient documentation

## 2011-11-11 DIAGNOSIS — Z862 Personal history of diseases of the blood and blood-forming organs and certain disorders involving the immune mechanism: Secondary | ICD-10-CM | POA: Insufficient documentation

## 2011-11-11 DIAGNOSIS — E119 Type 2 diabetes mellitus without complications: Secondary | ICD-10-CM | POA: Insufficient documentation

## 2011-11-11 DIAGNOSIS — Z794 Long term (current) use of insulin: Secondary | ICD-10-CM | POA: Insufficient documentation

## 2011-11-11 DIAGNOSIS — N189 Chronic kidney disease, unspecified: Secondary | ICD-10-CM | POA: Insufficient documentation

## 2011-11-11 DIAGNOSIS — R079 Chest pain, unspecified: Secondary | ICD-10-CM | POA: Insufficient documentation

## 2011-11-11 DIAGNOSIS — S0101XA Laceration without foreign body of scalp, initial encounter: Secondary | ICD-10-CM

## 2011-11-11 MED ORDER — OXYCODONE-ACETAMINOPHEN 5-325 MG PO TABS: 1.0000 | ORAL_TABLET | Freq: Four times a day (QID) | ORAL | Status: AC | PRN
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
## 2011-11-15 ENCOUNTER — Ambulatory Visit: Payer: Medicare Other | Admitting: Family Medicine

## 2011-11-19 ENCOUNTER — Ambulatory Visit (INDEPENDENT_AMBULATORY_CARE_PROVIDER_SITE_OTHER): Payer: Medicare Other | Admitting: Family Medicine

## 2011-11-19 ENCOUNTER — Encounter: Payer: Self-pay | Admitting: Family Medicine

## 2011-11-19 VITALS — BP 123/75 | HR 75 | Temp 98.6°F | Ht 67.5 in | Wt 170.0 lb

## 2011-11-19 DIAGNOSIS — K1324 Leukokeratosis nicotina palati: Secondary | ICD-10-CM | POA: Insufficient documentation

## 2011-11-19 DIAGNOSIS — R4789 Other speech disturbances: Secondary | ICD-10-CM

## 2011-11-19 DIAGNOSIS — Q383 Other congenital malformations of tongue: Secondary | ICD-10-CM

## 2011-11-19 DIAGNOSIS — L858 Other specified epidermal thickening: Secondary | ICD-10-CM | POA: Insufficient documentation

## 2011-11-19 DIAGNOSIS — R059 Cough, unspecified: Secondary | ICD-10-CM

## 2011-11-19 DIAGNOSIS — R4781 Slurred speech: Secondary | ICD-10-CM | POA: Insufficient documentation

## 2011-11-19 DIAGNOSIS — R05 Cough: Secondary | ICD-10-CM
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
## 2011-11-19 NOTE — Assessment & Plan Note (Addendum)
precepted with Dr. McDiarmid.  Advised to stop smoking and follow up if worse.

## 2011-11-20 ENCOUNTER — Encounter: Payer: Self-pay | Admitting: Family Medicine

## 2011-11-20 DIAGNOSIS — R05 Cough: Secondary | ICD-10-CM | POA: Insufficient documentation

## 2011-11-20 DIAGNOSIS — R059 Cough, unspecified: Secondary | ICD-10-CM | POA: Insufficient documentation

## 2011-11-20 NOTE — Assessment & Plan Note (Signed)
Cough without wheeze. Normal O2 sat of 98%. I do not think that this is a COPD exacerbation. Gave instructions for symptomatic treatment and have patient return if worse.
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
## 2011-12-10 ENCOUNTER — Encounter: Payer: Self-pay | Admitting: Family Medicine

## 2011-12-25 ENCOUNTER — Ambulatory Visit (INDEPENDENT_AMBULATORY_CARE_PROVIDER_SITE_OTHER): Payer: Medicare Other | Admitting: Ophthalmology

## 2011-12-25 DIAGNOSIS — E1139 Type 2 diabetes mellitus with other diabetic ophthalmic complication: Secondary | ICD-10-CM

## 2011-12-25 DIAGNOSIS — E1165 Type 2 diabetes mellitus with hyperglycemia: Secondary | ICD-10-CM

## 2011-12-25 DIAGNOSIS — E11359 Type 2 diabetes mellitus with proliferative diabetic retinopathy without macular edema: Secondary | ICD-10-CM

## 2011-12-25 DIAGNOSIS — H3581 Retinal edema: Secondary | ICD-10-CM

## 2011-12-25 DIAGNOSIS — H43819 Vitreous degeneration, unspecified eye: Secondary | ICD-10-CM

## 2011-12-25 DIAGNOSIS — H2510 Age-related nuclear cataract, unspecified eye: Secondary | ICD-10-CM

## 2011-12-25 DIAGNOSIS — E11319 Type 2 diabetes mellitus with unspecified diabetic retinopathy without macular edema: Secondary | ICD-10-CM

## 2012-02-24 ENCOUNTER — Other Ambulatory Visit: Payer: Self-pay | Admitting: Family Medicine

## 2012-02-27 ENCOUNTER — Telehealth: Payer: Self-pay | Admitting: Family Medicine

## 2012-02-27 NOTE — Telephone Encounter (Signed)
Pt is asking for 50mg  of his Viagra instead of 100mg .  It is cheaper to get the 50mg .  Wants it to say - 50mg  once a week x 5 weeks

## 2012-02-27 NOTE — Telephone Encounter (Signed)
Will forward message to Dr. Oh Park. 

## 2012-02-28 MED ORDER — SILDENAFIL CITRATE 50 MG PO TABS: 50.0000 mg | ORAL_TABLET | Freq: Every day | ORAL | Status: AC | PRN

## 2012-02-28 NOTE — Telephone Encounter (Signed)
Please notify patient Rx has been sent to [facility] on [address]. Thank you.

## 2012-04-10 ENCOUNTER — Telehealth: Payer: Self-pay | Admitting: Family Medicine

## 2012-04-10 ENCOUNTER — Ambulatory Visit (HOSPITAL_COMMUNITY)
Admission: RE | Admit: 2012-04-10 | Discharge: 2012-04-10 | Disposition: A | Discharge status: Home / Self Care | Payer: Medicare Other | Source: Ambulatory Visit | Attending: Family Medicine | Admitting: Family Medicine

## 2012-04-10 ENCOUNTER — Encounter: Payer: Self-pay | Admitting: Family Medicine

## 2012-04-10 ENCOUNTER — Ambulatory Visit (INDEPENDENT_AMBULATORY_CARE_PROVIDER_SITE_OTHER): Payer: Medicare Other | Admitting: Family Medicine

## 2012-04-10 VITALS — BP 120/80 | HR 82 | Temp 98.1°F | Ht 67.5 in | Wt 165.1 lb

## 2012-04-10 DIAGNOSIS — M79671 Pain in right foot: Secondary | ICD-10-CM

## 2012-04-10 DIAGNOSIS — M7989 Other specified soft tissue disorders: Secondary | ICD-10-CM | POA: Insufficient documentation

## 2012-04-10 DIAGNOSIS — M79609 Pain in unspecified limb: Secondary | ICD-10-CM | POA: Insufficient documentation

## 2012-04-10 DIAGNOSIS — E119 Type 2 diabetes mellitus without complications: Secondary | ICD-10-CM | POA: Insufficient documentation

## 2012-04-10 DIAGNOSIS — R609 Edema, unspecified: Secondary | ICD-10-CM

## 2012-04-10 LAB — POCT GLYCOSYLATED HEMOGLOBIN (HGB A1C): Hemoglobin A1C: 8.6

## 2012-04-10 NOTE — Progress Notes (Signed)
  Subjective:    Patient ID: Adam Ray, male    DOB: Oct 22, 1955, 56 y.o.   MRN: [id number]  HPI 1.  Right foot swelling:  Patient has noticed a "knot" on bottom of his foot on arch of his foot for past 3 months.  States that this is increasing in size.  Is sore when he has to walk on this all day long.  Is concerned it is a callus.  Does not want to cut on it in case it is infected.  No fevers or chills.  No drainage from site.    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     Review of Systems See HPI above for review of systems.       Objective:   Physical Exam  Gen:  Alert, cooperative patient who appears stated age in no acute distress.  Vital signs reviewed. Ext:  Right foot s/p amputation of Great toe.  3 x 3 cm nodule located on plantar aspect of Right foot.  Minimally tender to pain.  No erythema.  No ulcers or open wounds.  Chronic venous stasis changes noted BL LE's.        Assessment & Plan:

## 2012-04-10 NOTE — Telephone Encounter (Signed)
Discussed with Dr. Gwendolyn Grant who saw patient today in clinic.  We both agree that patient needs more imaging of his foot and should come to ED for further evaluation.  I called patient and he says foot lesion has been there for 3 months, but but pain has become worse in the last few weeks.  Discussed X-ray with patient and advised him to go to ED tonight.  He says he cannot come tonight (did not say why), but can come to ED in the morning.  I told him this was fine, but if he developed fever, chills, night sweats, nausea/vomiting, or worsening pain, he should come to ED immediately.  Reviewed flags with patient twice and he understood.  I will flag this message to Tana Conch who is on call tomorrow to confirm that patient will be coming to ED in AM.

## 2012-04-10 NOTE — Patient Instructions (Signed)
[facility] for [facility] xray.    I will call you with the results.    Keep taking Advil for pain relief.

## 2012-04-10 NOTE — Telephone Encounter (Signed)
Radiology called physician on-call.  Foot X-ray revealed Ddffuse soft tissue swelling with focal soft tissue swelling and foci of gas at the plantar aspect of the right foot either representing an abscess by a gas forming organism or an open wound. Reviewed note from today - vital signs were stable in our office.  Patient complained of localized pain, but no nausea/vomiting or fever.  Will discuss case with my attending and decide if patient can be treated outpatient with PO antibiotics and close follow up vs. Inpatient.

## 2012-04-11 ENCOUNTER — Encounter (HOSPITAL_COMMUNITY): Payer: Self-pay | Admitting: Anesthesiology

## 2012-04-11 ENCOUNTER — Observation Stay (HOSPITAL_COMMUNITY)
Admission: EM | Admit: 2012-04-11 | Discharge: 2012-04-12 | Disposition: A | Discharge status: Home / Self Care | Payer: Medicare Other | Attending: Emergency Medicine | Admitting: Emergency Medicine

## 2012-04-11 ENCOUNTER — Encounter (HOSPITAL_COMMUNITY)
Admission: EM | Disposition: A | Discharge status: Home / Self Care | Payer: Self-pay | Source: Home / Self Care | Attending: Emergency Medicine

## 2012-04-11 ENCOUNTER — Emergency Department (HOSPITAL_COMMUNITY): Payer: Medicare Other | Admitting: Anesthesiology

## 2012-04-11 ENCOUNTER — Encounter (HOSPITAL_COMMUNITY): Payer: Self-pay | Admitting: Physical Medicine and Rehabilitation

## 2012-04-11 DIAGNOSIS — S98119A Complete traumatic amputation of unspecified great toe, initial encounter: Secondary | ICD-10-CM | POA: Insufficient documentation

## 2012-04-11 DIAGNOSIS — M79609 Pain in unspecified limb: Secondary | ICD-10-CM | POA: Insufficient documentation

## 2012-04-11 DIAGNOSIS — I129 Hypertensive chronic kidney disease with stage 1 through stage 4 chronic kidney disease, or unspecified chronic kidney disease: Secondary | ICD-10-CM | POA: Insufficient documentation

## 2012-04-11 DIAGNOSIS — N182 Chronic kidney disease, stage 2 (mild): Secondary | ICD-10-CM | POA: Insufficient documentation

## 2012-04-11 DIAGNOSIS — E119 Type 2 diabetes mellitus without complications: Secondary | ICD-10-CM | POA: Insufficient documentation

## 2012-04-11 DIAGNOSIS — L03119 Cellulitis of unspecified part of limb: Secondary | ICD-10-CM | POA: Insufficient documentation

## 2012-04-11 DIAGNOSIS — F17200 Nicotine dependence, unspecified, uncomplicated: Secondary | ICD-10-CM | POA: Insufficient documentation

## 2012-04-11 DIAGNOSIS — J449 Chronic obstructive pulmonary disease, unspecified: Secondary | ICD-10-CM | POA: Insufficient documentation

## 2012-04-11 DIAGNOSIS — G4730 Sleep apnea, unspecified: Secondary | ICD-10-CM | POA: Insufficient documentation

## 2012-04-11 DIAGNOSIS — L02619 Cutaneous abscess of unspecified foot: Principal | ICD-10-CM | POA: Insufficient documentation

## 2012-04-11 DIAGNOSIS — L84 Corns and callosities: Secondary | ICD-10-CM | POA: Insufficient documentation

## 2012-04-11 DIAGNOSIS — J4489 Other specified chronic obstructive pulmonary disease: Secondary | ICD-10-CM | POA: Insufficient documentation

## 2012-04-11 HISTORY — PX: I & D EXTREMITY: SHX5045

## 2012-04-11 LAB — CBC
HCT: 40.2 % (ref 39.0–52.0)
Hemoglobin: 13.8 g/dL (ref 13.0–17.0)
MCV: 85 fL (ref 78.0–100.0)
RDW: 13.7 % (ref 11.5–15.5)
WBC: 6.6 10*3/uL (ref 4.0–10.5)

## 2012-04-11 LAB — BASIC METABOLIC PANEL
BUN: 19 mg/dL (ref 6–23)
CO2: 23 mEq/L (ref 19–32)
Chloride: 104 mEq/L (ref 96–112)
Creatinine, Ser: 1.18 mg/dL (ref 0.50–1.35)
GFR calc Af Amer: 77 mL/min — ABNORMAL LOW (ref >90)
Glucose, Bld: 269 mg/dL — ABNORMAL HIGH (ref 70–99)
Potassium: 4.3 mEq/L (ref 3.5–5.1)

## 2012-04-11 LAB — SEDIMENTATION RATE: Sed Rate: 9 mm/hr (ref 0–16)

## 2012-04-11 LAB — GLUCOSE, CAPILLARY
Glucose-Capillary: 242 mg/dL — ABNORMAL HIGH (ref 70–99)
Glucose-Capillary: 178 mg/dL — ABNORMAL HIGH (ref 70–99)

## 2012-04-11 SURGERY — IRRIGATION AND DEBRIDEMENT EXTREMITY
Anesthesia: General | Site: Foot | Laterality: Right | Wound class: Dirty or Infected

## 2012-04-11 MED ORDER — FENTANYL CITRATE 0.05 MG/ML IJ SOLN
INTRAMUSCULAR | Status: DC | PRN
  Administered 2012-04-11: 100 ug via INTRAVENOUS

## 2012-04-11 MED ORDER — SODIUM CHLORIDE 0.9 % IV BOLUS (SEPSIS)
500.0000 mL | Freq: Once | INTRAVENOUS | Status: AC
  Administered 2012-04-11: 500 mL via INTRAVENOUS

## 2012-04-11 MED ORDER — ONDANSETRON HCL 4 MG/2ML IJ SOLN: 4.0000 mg | Freq: Once | INTRAMUSCULAR | Status: DC | PRN

## 2012-04-11 MED ORDER — INSULIN GLARGINE 100 UNIT/ML ~~LOC~~ SOLN: 100.0000 [IU] | Freq: Every day | SUBCUTANEOUS | Status: DC

## 2012-04-11 MED ORDER — MEPERIDINE HCL 25 MG/ML IJ SOLN: 6.2500 mg | INTRAMUSCULAR | Status: DC | PRN

## 2012-04-11 MED ORDER — LISINOPRIL 2.5 MG PO TABS
2.5000 mg | ORAL_TABLET | Freq: Every day | ORAL | Status: DC
  Administered 2012-04-12: 2.5 mg via ORAL
  Filled 2012-04-11: qty 1

## 2012-04-11 MED ORDER — HYDROMORPHONE HCL PF 1 MG/ML IJ SOLN
0.2500 mg | INTRAMUSCULAR | Status: DC | PRN
  Administered 2012-04-11 (×2): 0.5 mg via INTRAVENOUS

## 2012-04-11 MED ORDER — INSULIN GLARGINE 100 UNIT/ML ~~LOC~~ SOLN
50.0000 [IU] | Freq: Every day | SUBCUTANEOUS | Status: DC
  Administered 2012-04-11: 50 [IU] via SUBCUTANEOUS

## 2012-04-11 MED ORDER — ASPIRIN EC 81 MG PO TBEC
81.0000 mg | DELAYED_RELEASE_TABLET | Freq: Every day | ORAL | Status: DC
  Administered 2012-04-12: 81 mg via ORAL
  Filled 2012-04-11: qty 1

## 2012-04-11 MED ORDER — INSULIN ASPART 100 UNIT/ML ~~LOC~~ SOLN: 0.0000 [IU] | Freq: Every day | SUBCUTANEOUS | Status: DC

## 2012-04-11 MED ORDER — VANCOMYCIN HCL 1000 MG IV SOLR
1000.0000 mg | INTRAVENOUS | Status: DC | PRN
  Administered 2012-04-11: 1000 mg via INTRAVENOUS

## 2012-04-11 MED ORDER — NICOTINE 7 MG/24HR TD PT24
7.0000 mg | MEDICATED_PATCH | Freq: Every day | TRANSDERMAL | Status: DC
  Filled 2012-04-11 (×2): qty 1

## 2012-04-11 MED ORDER — FUROSEMIDE 20 MG PO TABS
20.0000 mg | ORAL_TABLET | Freq: Every day | ORAL | Status: DC
  Administered 2012-04-12: 20 mg via ORAL
  Filled 2012-04-11: qty 1

## 2012-04-11 MED ORDER — SODIUM CHLORIDE 0.9 % IV BOLUS (SEPSIS): 500.0000 mL | Freq: Once | INTRAVENOUS | Status: DC

## 2012-04-11 MED ORDER — SENNA 8.6 MG PO TABS
1.0000 | ORAL_TABLET | Freq: Two times a day (BID) | ORAL | Status: DC
  Administered 2012-04-11 – 2012-04-12 (×2): 8.6 mg via ORAL
  Filled 2012-04-11 (×3): qty 1

## 2012-04-11 MED ORDER — SODIUM CHLORIDE 0.9 % IR SOLN
Status: DC | PRN
  Administered 2012-04-11: 3000 mL

## 2012-04-11 MED ORDER — ALBUTEROL SULFATE HFA 108 (90 BASE) MCG/ACT IN AERS
2.0000 | INHALATION_SPRAY | Freq: Four times a day (QID) | RESPIRATORY_TRACT | Status: DC | PRN
  Filled 2012-04-11: qty 6.7

## 2012-04-11 MED ORDER — INSULIN ASPART 100 UNIT/ML ~~LOC~~ SOLN
0.0000 [IU] | Freq: Three times a day (TID) | SUBCUTANEOUS | Status: DC
  Administered 2012-04-12: 1 [IU] via SUBCUTANEOUS

## 2012-04-11 MED ORDER — OXYCODONE HCL 5 MG PO TABS: 5.0000 mg | ORAL_TABLET | Freq: Once | ORAL | Status: DC | PRN

## 2012-04-11 MED ORDER — PIPERACILLIN-TAZOBACTAM 3.375 G IVPB
3.3750 g | Freq: Three times a day (TID) | INTRAVENOUS | Status: DC
  Administered 2012-04-11 – 2012-04-12 (×3): 3.375 g via INTRAVENOUS
  Filled 2012-04-11 (×5): qty 50

## 2012-04-11 MED ORDER — PROPOFOL 10 MG/ML IV EMUL
INTRAVENOUS | Status: DC | PRN
  Administered 2012-04-11: 140 mL via INTRAVENOUS

## 2012-04-11 MED ORDER — SODIUM CHLORIDE 0.9 % IV SOLN
INTRAVENOUS | Status: DC
  Administered 2012-04-11: 19:00:00 via INTRAVENOUS

## 2012-04-11 MED ORDER — ONDANSETRON HCL 4 MG/2ML IJ SOLN: 4.0000 mg | Freq: Four times a day (QID) | INTRAMUSCULAR | Status: DC | PRN

## 2012-04-11 MED ORDER — SODIUM CHLORIDE 0.9 % IR SOLN
Status: DC | PRN
  Administered 2012-04-11: 1000 mL

## 2012-04-11 MED ORDER — METOCLOPRAMIDE HCL 5 MG/ML IJ SOLN: 5.0000 mg | Freq: Three times a day (TID) | INTRAMUSCULAR | Status: DC | PRN

## 2012-04-11 MED ORDER — VANCOMYCIN HCL 1000 MG IV SOLR
750.0000 mg | Freq: Two times a day (BID) | INTRAVENOUS | Status: DC
  Filled 2012-04-11 (×2): qty 750

## 2012-04-11 MED ORDER — METFORMIN HCL 500 MG PO TABS: 1000.0000 mg | ORAL_TABLET | Freq: Two times a day (BID) | ORAL | Status: DC

## 2012-04-11 MED ORDER — SODIUM CHLORIDE 0.9 % IV SOLN
INTRAVENOUS | Status: DC | PRN
  Administered 2012-04-11: 16:00:00 via INTRAVENOUS

## 2012-04-11 MED ORDER — METOCLOPRAMIDE HCL 10 MG PO TABS: 5.0000 mg | ORAL_TABLET | Freq: Three times a day (TID) | ORAL | Status: DC | PRN

## 2012-04-11 MED ORDER — ONDANSETRON HCL 4 MG/2ML IJ SOLN
INTRAMUSCULAR | Status: DC | PRN
  Administered 2012-04-11: 4 mg via INTRAVENOUS

## 2012-04-11 MED ORDER — OXYCODONE HCL 5 MG/5ML PO SOLN: 5.0000 mg | Freq: Once | ORAL | Status: DC | PRN

## 2012-04-11 MED ORDER — ONDANSETRON HCL 4 MG PO TABS: 4.0000 mg | ORAL_TABLET | Freq: Four times a day (QID) | ORAL | Status: DC | PRN

## 2012-04-11 MED ORDER — SODIUM CHLORIDE 0.9 % IV SOLN
INTRAVENOUS | Status: DC | PRN
  Administered 2012-04-11: 17:00:00 via INTRAVENOUS

## 2012-04-11 MED ORDER — HYDROCODONE-ACETAMINOPHEN 5-325 MG PO TABS
1.0000 | ORAL_TABLET | ORAL | Status: DC | PRN
  Administered 2012-04-11 – 2012-04-12 (×2): 2 via ORAL
  Administered 2012-04-12: 1 via ORAL
  Filled 2012-04-11: qty 1
  Filled 2012-04-11 (×2): qty 2

## 2012-04-11 MED ORDER — LIDOCAINE HCL (CARDIAC) 20 MG/ML IV SOLN
INTRAVENOUS | Status: DC | PRN
  Administered 2012-04-11: 100 mg via INTRAVENOUS

## 2012-04-11 MED ORDER — MIDAZOLAM HCL 5 MG/5ML IJ SOLN
INTRAMUSCULAR | Status: DC | PRN
  Administered 2012-04-11: 2 mg via INTRAVENOUS

## 2012-04-11 MED ORDER — INSULIN ASPART 100 UNIT/ML ~~LOC~~ SOLN: 0.0000 [IU] | Freq: Three times a day (TID) | SUBCUTANEOUS | Status: DC

## 2012-04-11 MED ORDER — MORPHINE SULFATE 2 MG/ML IJ SOLN: 1.0000 mg | INTRAMUSCULAR | Status: DC | PRN

## 2012-04-11 MED ORDER — DOCUSATE SODIUM 100 MG PO CAPS
100.0000 mg | ORAL_CAPSULE | Freq: Two times a day (BID) | ORAL | Status: DC
  Administered 2012-04-11 – 2012-04-12 (×2): 100 mg via ORAL
  Filled 2012-04-11 (×3): qty 1

## 2012-04-11 MED ORDER — VANCOMYCIN HCL 1000 MG IV SOLR
750.0000 mg | Freq: Two times a day (BID) | INTRAVENOUS | Status: DC
  Administered 2012-04-12: 750 mg via INTRAVENOUS
  Filled 2012-04-11 (×3): qty 750

## 2012-04-11 SURGICAL SUPPLY — 52 items
BANDAGE CONFORM 3  STR LF (GAUZE/BANDAGES/DRESSINGS) ×1 IMPLANT
BANDAGE ELASTIC 4 VELCRO ST LF (GAUZE/BANDAGES/DRESSINGS) ×2 IMPLANT
BANDAGE ESMARK 6X9 LF (GAUZE/BANDAGES/DRESSINGS) ×1 IMPLANT
BANDAGE GAUZE ELAST BULKY 4 IN (GAUZE/BANDAGES/DRESSINGS) ×1 IMPLANT
BLADE SURG 10 STRL SS (BLADE) ×2 IMPLANT
BNDG CMPR 9X4 STRL LF SNTH (GAUZE/BANDAGES/DRESSINGS) ×1
BNDG CMPR 9X6 STRL LF SNTH (GAUZE/BANDAGES/DRESSINGS)
BNDG COHESIVE 4X5 TAN STRL (GAUZE/BANDAGES/DRESSINGS) ×1 IMPLANT
BNDG COHESIVE 6X5 TAN STRL LF (GAUZE/BANDAGES/DRESSINGS) ×1 IMPLANT
BNDG ESMARK 4X9 LF (GAUZE/BANDAGES/DRESSINGS) ×1 IMPLANT
BNDG ESMARK 6X9 LF (GAUZE/BANDAGES/DRESSINGS)
CHLORAPREP W/TINT 26ML (MISCELLANEOUS) ×1 IMPLANT
CLOTH BEACON ORANGE TIMEOUT ST (SAFETY) ×2 IMPLANT
COVER SURGICAL LIGHT HANDLE (MISCELLANEOUS) ×2 IMPLANT
CUFF TOURNIQUET SINGLE 34IN LL (TOURNIQUET CUFF) ×1 IMPLANT
CUFF TOURNIQUET SINGLE 44IN (TOURNIQUET CUFF) IMPLANT
DRAIN PENROSE 1/2X12 LTX STRL (WOUND CARE) IMPLANT
DRAPE U-SHAPE 47X51 STRL (DRAPES) ×2 IMPLANT
DRSG ADAPTIC 3X8 NADH LF (GAUZE/BANDAGES/DRESSINGS) ×1 IMPLANT
DRSG PAD ABDOMINAL 8X10 ST (GAUZE/BANDAGES/DRESSINGS) ×1 IMPLANT
DURAPREP 26ML APPLICATOR (WOUND CARE) ×1 IMPLANT
ELECT REM PT RETURN 9FT ADLT (ELECTROSURGICAL)
ELECTRODE REM PT RTRN 9FT ADLT (ELECTROSURGICAL) ×1 IMPLANT
GLOVE BIO SURGEON STRL SZ8 (GLOVE) ×2 IMPLANT
GLOVE BIOGEL PI IND STRL 8 (GLOVE) ×1 IMPLANT
GLOVE BIOGEL PI INDICATOR 8 (GLOVE) ×1
GOWN PREVENTION PLUS XLARGE (GOWN DISPOSABLE) ×2 IMPLANT
GOWN STRL NON-REIN LRG LVL3 (GOWN DISPOSABLE) ×3 IMPLANT
KIT BASIN OR (CUSTOM PROCEDURE TRAY) ×2 IMPLANT
KIT ROOM TURNOVER OR (KITS) ×2 IMPLANT
MANIFOLD NEPTUNE II (INSTRUMENTS) ×2 IMPLANT
NS IRRIG 1000ML POUR BTL (IV SOLUTION) ×2 IMPLANT
PACK ORTHO EXTREMITY (CUSTOM PROCEDURE TRAY) ×2 IMPLANT
PAD ARMBOARD 7.5X6 YLW CONV (MISCELLANEOUS) ×3 IMPLANT
PAD CAST 4YDX4 CTTN HI CHSV (CAST SUPPLIES) ×1 IMPLANT
PADDING CAST COTTON 4X4 STRL (CAST SUPPLIES)
SOAP 2 % CHG 4 OZ (WOUND CARE) ×2 IMPLANT
SPONGE GAUZE 4X4 12PLY (GAUZE/BANDAGES/DRESSINGS) ×1 IMPLANT
STAPLER VISISTAT 35W (STAPLE) IMPLANT
SUCTION FRAZIER TIP 10 FR DISP (SUCTIONS) ×2 IMPLANT
SUT ETHILON 3 0 FSL (SUTURE) IMPLANT
SUT PROLENE 3 0 PS 2 (SUTURE) ×1 IMPLANT
SUT VIC AB 2-0 CT1 27 (SUTURE)
SUT VIC AB 2-0 CT1 TAPERPNT 27 (SUTURE) ×2 IMPLANT
SUT VIC AB 3-0 PS2 18 (SUTURE)
SUT VIC AB 3-0 PS2 18XBRD (SUTURE) ×1 IMPLANT
TOWEL OR 17X24 6PK STRL BLUE (TOWEL DISPOSABLE) ×2 IMPLANT
TOWEL OR 17X26 10 PK STRL BLUE (TOWEL DISPOSABLE) ×2 IMPLANT
TUBE CONNECTING 12X1/4 (SUCTIONS) ×2 IMPLANT
TUBING CYSTO DISP (UROLOGICAL SUPPLIES) ×2 IMPLANT
WATER STERILE IRR 1000ML POUR (IV SOLUTION) ×1 IMPLANT
YANKAUER SUCT BULB TIP NO VENT (SUCTIONS) ×1 IMPLANT

## 2012-04-11 NOTE — Consult Note (Signed)
Reason for Consult:  Right foot swelling and pain Referring Physician: Dr. Durene Cal for Dr. Smitty Knudsen is an 56 y.o. male.  HPI: 56 y/o male with PMH of poorly controlled diabetes and smoking.  He c/o plantar foot pain for several months but worse over the last 3 weeks with increased swelling.  He has a h/o 1st ray amputation by Dr. Luiz Blare several years ago.  He denies any drainage or foul smell from the painful area.  Foot feels better only when elevated.  Denies f/c/n/v/wt loss.  Not currently on any abx.  Past Medical History  Diagnosis Date  . Diabetes mellitus   . COPD (chronic obstructive pulmonary disease)   . Allergy   . Chronic kidney disease   . Bronchitis   . Gout     Past Surgical History  Procedure Date  . Surgery left great toe   . Tear ducts bilateral eyes     Family History  Problem Relation Age of Onset  . Diabetes Mother   . Stroke Mother   . Heart failure Father     Social History:  reports that he has been smoking Cigars.  He uses smokeless tobacco. He reports that he does not drink alcohol or use illicit drugs.  Allergies: No Known Allergies  Medications: I have reviewed the patient's current medications.  Results for orders placed during the hospital encounter of 04/11/12 (from the past 48 hour(s))  GLUCOSE, CAPILLARY     Status: Abnormal   Collection Time   04/11/12  8:11 AM      Component Value Range Comment   Glucose-Capillary 255 (*) 70 - 99 mg/dL    Comment 1 Notify RN     SEDIMENTATION RATE     Status: Normal   Collection Time   04/11/12  8:12 AM      Component Value Range Comment   Sed Rate 9  0 - 16 mm/hr   CBC     Status: Abnormal   Collection Time   04/11/12  8:12 AM      Component Value Range Comment   WBC 6.6  4.0 - 10.5 K/uL    RBC 4.73  4.22 - 5.81 MIL/uL    Hemoglobin 13.8  13.0 - 17.0 g/dL    HCT 14.7  82.9 - 56.2 %    MCV 85.0  78.0 - 100.0 fL    MCH 29.2  26.0 - 34.0 pg    MCHC 34.3  30.0 - 36.0 g/dL    RDW  13.0  86.5 - 78.4 %    Platelets 129 (*) 150 - 400 K/uL   BASIC METABOLIC PANEL     Status: Abnormal   Collection Time   04/11/12  8:12 AM      Component Value Range Comment   Sodium 138  135 - 145 mEq/L    Potassium 4.3  3.5 - 5.1 mEq/L    Chloride 104  96 - 112 mEq/L    CO2 23  19 - 32 mEq/L    Glucose, Bld 269 (*) 70 - 99 mg/dL    BUN 19  6 - 23 mg/dL    Creatinine, Ser 6.96  0.50 - 1.35 mg/dL    Calcium 9.3  8.4 - 29.5 mg/dL    GFR calc non Af Amer 67 (*) >90 mL/min    GFR calc Af Amer 77 (*) >90 mL/min   GLUCOSE, CAPILLARY     Status: Abnormal   Collection Time  04-28-12  9:20 AM      Component Value Range Comment   Glucose-Capillary 242 (*) 70 - 99 mg/dL    Comment 1 Notify RN       Dg Foot Complete Right  04/10/2012  **ADDENDUM** CREATED: 04/10/2012 17:17:15  Findings called to Dr. Domenick Bookbinder on 04/10/2012 at 1718 hours.  **END ADDENDUM** SIGNED BY: Loraine Leriche A. Tyron Russell, M.D.   04/10/2012  *RADIOLOGY REPORT*  Clinical Data: Increased pain at plantar aspect of right foot, swelling/knot at bottom of foot, history diabetes, prior first ray amputation  RIGHT FOOT COMPLETE - 3+ VIEW  Comparison: 07/18/2007  Findings: Interval amputation of the first ray from the first metatarsal distally. Bones appear diffusely demineralized. Joint spaces preserved. No acute fracture or dislocation. Regional soft tissue swelling at foot. Additionally, focal soft tissue swelling with several foci of gas identified at plantar aspect of the midfoot compatible with either an open wound or soft tissue infection/abscess by gas forming organism. No bone destruction seen.  IMPRESSION: Prior amputation of the first ray. Diffuse soft tissue swelling with focal soft tissue swelling and foci of gas at the plantar aspect of the right foot either representing an abscess by a gas forming organism or an open wound.   Original Report Authenticated By: Lollie Marrow, M.D.     ROS:  As above. PE:  Blood pressure 128/83,  pulse 69, temperature 98.4 F (36.9 C), temperature source Oral, resp. rate 20, SpO2 98.00%. wn wd male in nad.  A and O x 4.  Mood and affect normal.  EOMI.  Respirations unlabored.  R foot with plantar callous under the midfoot.  Skin intact with some mild erythema over the dorsal midfoot with swelling.  No lymphadenopathy. 1+ dp and pt pulses.  Sens to LT diminished at forefoot.  Severe hammertoe deformities.  Previous incision well healed.  Fluctuance under the callous.  5/5 strength in PF and DF at the ankle.  Assessment/Plan: R foot plantar abscess - after informed consent I pared the superficial aspect of the callous.  Immediately evident was purulent foul smelling drainage.  I collected a specimen and sent it to micro for aerobic and anaerobic culture.  BAsed on this exam, I believe it's medically necessary to take the patient to the operating room for formal I and D.  He may need partial resection of the medial cuneiform given the proximity to the abscess.  The risks and benefits of the alternative treatment options have been discussed in detail.  The patient wishes to proceed with surgery and specifically understands risks of bleeding, infection, nerve damage, blood clots, need for additional surgery, amputation and death.   Toni Arthurs 2012-04-28, 3:02 PM

## 2012-04-11 NOTE — ED Notes (Signed)
Dr. Victorino Dike at the bedside to perform I&D of R foot abscess. Pt tolerated without difficulty. Wound culture sent to lab for testing.

## 2012-04-11 NOTE — Brief Op Note (Signed)
04/11/2012  4:43 PM  PATIENT:  Adam Ray  56 y.o. male  PRE-OPERATIVE DIAGNOSIS:  right plantar foot abcess  POST-OPERATIVE DIAGNOSIS:  right plantar foot abcess  Procedure(s): Irrigation and excisional debridement of right foot plantar abscess  SURGEON:  Toni Arthurs, MD  ASSISTANT: n/a  ANESTHESIA:   General  EBL:  minimal   TOURNIQUET:   Total Tourniquet Time Documented: Calf (Right) - 7 minutes  COMPLICATIONS:  None apparent  DISPOSITION:  Extubated, awake and stable to recovery.  DICTATION ID:   [id number]

## 2012-04-11 NOTE — ED Provider Notes (Signed)
Medical screening examination/treatment/procedure(s) were performed by non-physician practitioner and as supervising physician I was immediately available for consultation/collaboration.  Ethelda Chick, MD 04/11/12 1256

## 2012-04-11 NOTE — Progress Notes (Signed)
ANTIBIOTIC CONSULT NOTE - INITIAL  Pharmacy Consult for Vancomycin + Zosyn Indication: Plantar foot abscess  No Known Allergies  Patient Measurements: Height: 5' 7.5" (171.5 cm) Weight: 165 lb 2 oz (74.9 kg) IBW/kg (Calculated) : 67.25   Vital Signs: Temp: 97.6 F (36.4 C) (10/12 1725) Temp src: Oral (10/12 1615) BP: 107/62 mmHg (10/12 1732) Pulse Rate: 65  (10/12 1730) Intake/Output from previous day:   Intake/Output from this shift: Total I/O In: 1400 [I.V.:1400] Out: 410 [Urine:400; Blood:10]  Labs:  [facility] 04/11/12 0812  WBC 6.6  HGB 13.8  PLT 129*  LABCREA --  CREATININE 1.18   Estimated Creatinine Clearance: 65.7 ml/min (by C-G formula based on Cr of 1.18). No results found for this basename: VANCOTROUGH:2,VANCOPEAK:2,VANCORANDOM:2,GENTTROUGH:2,GENTPEAK:2,GENTRANDOM:2,TOBRATROUGH:2,TOBRAPEAK:2,TOBRARND:2,AMIKACINPEAK:2,AMIKACINTROU:2,AMIKACIN:2, in the last 72 hours   Microbiology: No results found for this or any previous visit (from the past 720 hour(s)).  Medical History: Past Medical History  Diagnosis Date  . Diabetes mellitus   . COPD (chronic obstructive pulmonary disease)   . Allergy   . Chronic kidney disease   . Bronchitis   . Gout     Assessment: 56 y.o. M who presented to the [facility] on 10/12 with R-foot pain x 3 weeks and was noted on X-ray to have abscess. Pt was taken to the OR and is now s/p I&D -- cultures pending. Pharmacy was consulted to start Vancomycin + Zosyn for empiric for plantar foot abscess while waiting on culture results. No osteo noted at this time. The patient is noted to receive 1 dose of Vancomycin 1g around 1630 during his procedure today. Wt: 74.9 kg, SCr 1.18, CrCl~70 ml/min.   Goal of Therapy:  Vancomycin trough level 10-15 mcg/ml  Plan:  1. Vancomycin 750 mg IV every 12 hours (starting on 10/13) 2. Zosyn 3.375 g IV every 8 hours 3. Will continue to follow renal function, culture results, LOT, and antibiotic  de-escalation plans   Georgina Pillion, PharmD, BCPS Clinical Pharmacist Pager: [phone] 04/11/2012 7:05 PM

## 2012-04-11 NOTE — Anesthesia Preprocedure Evaluation (Addendum)
Anesthesia Evaluation  Patient identified by MRN, date of birth, ID band Patient awake    Reviewed: Allergy & Precautions, H&P , NPO status , Patient's Chart, lab work & pertinent test results  Airway Mallampati: III TM Distance: >3 FB     Dental  (+) Edentulous Upper and Edentulous Lower   Pulmonary neg sleep apnea, COPD   Pulmonary exam normal       Cardiovascular Exercise Tolerance: Poor hypertension, Pt. on medications Rhythm:Regular Rate:Normal  --------------------------------------------------------------------  Study Conclusions   - Left ventricle: Systolic function was normal. The estimated    ejection fraction was in the range of 60% to 65%.  - Aortic valve: Bicuspid; mildly thickened, mildly calcified    leaflets. Trivial regurgitation. Valve area: 3.04cm^2(VTI). Valve    area: 2.91cm^2 (Vmax).  Transthoracic echocardiography. M-mode, complete 2D, spectral  Doppler, and color Doppler. Height: Height: 180.3cm. Height: 71in.  Weight: Weight: 75kg. Weight: 165lb. Body mass index: BMI:  23.1kg/m^2. Body surface area: BSA: 1.11m^2. Blood pressure: 122/76.  Patient status: Outpatient. Location: Echo laboratory.     Neuro/Psych negative neurological ROS  negative psych ROS   GI/Hepatic negative GI ROS,   Endo/Other  diabetes, Type 2, Insulin Dependent  Renal/GU Renal InsufficiencyRenal disease     Musculoskeletal  (+) Arthritis -, Osteoarthritis,    Abdominal   Peds  Hematology Sickle Cell Trait   Anesthesia Other Findings One lower lat incisor remains  Reproductive/Obstetrics                        Anesthesia Physical Anesthesia Plan  ASA: III  Anesthesia Plan: General   Post-op Pain Management:    Induction: Intravenous  Airway Management Planned: LMA  Additional Equipment:   Intra-op Plan:   Post-operative Plan: Extubation in OR  Informed Consent: I have reviewed the  patients History and Physical, chart, labs and discussed the procedure including the risks, benefits and alternatives for the proposed anesthesia with the patient or authorized representative who has indicated his/her understanding and acceptance.   Dental advisory given  Plan Discussed with: CRNA and Anesthesiologist  Anesthesia Plan Comments:         Anesthesia Quick Evaluation

## 2012-04-11 NOTE — Anesthesia Postprocedure Evaluation (Signed)
Anesthesia Post Note  Patient: ABDURRAHEEM AFIFI  Procedure(s) Performed: Procedure(s) (LRB): IRRIGATION AND DEBRIDEMENT EXTREMITY (Right)  Anesthesia type: general  Patient location: PACU  Post pain: Pain level controlled  Post assessment: Patient's Cardiovascular Status Stable  Last Vitals:  Filed Vitals:   04/11/12 1732  BP: 107/62  Pulse:   Temp:   Resp:     Post vital signs: Reviewed and stable  Level of consciousness: sedated  Complications: No apparent anesthesia complications

## 2012-04-11 NOTE — ED Notes (Signed)
Report given to OR. Pt transported for surgery. 

## 2012-04-11 NOTE — ED Notes (Signed)
Dr. Victorino Dike at the bedside, preparing to take patient to the OR, consent to be signed per Dr. Victorino Dike. Pt updated, has no further questions at the time. Vital signs stable. Pt completely undressed and belongings place in bag.

## 2012-04-11 NOTE — ED Notes (Signed)
Notified RN of CBG 255 

## 2012-04-11 NOTE — Transfer of Care (Signed)
Immediate Anesthesia Transfer of Care Note  Patient: Adam Ray  Procedure(s) Performed: Procedure(s) (LRB) with comments: IRRIGATION AND DEBRIDEMENT EXTREMITY (Right)  Patient Location: PACU  Anesthesia Type: General  Level of Consciousness: awake, alert , oriented and patient cooperative  Airway & Oxygen Therapy: Patient Spontanous Breathing and Patient connected to nasal cannula oxygen  Post-op Assessment: Report given to PACU RN and Post -op Vital signs reviewed and stable  Post vital signs: Reviewed and stable  Complications: No apparent anesthesia complications

## 2012-04-11 NOTE — ED Notes (Addendum)
Pt presents to department for evaluation of R foot pain and swelling. Ongoing x2 months. Pt states difficulty walking due to pain, using crutches upon arrival. No open wounds noted. Pt is conscious alert and oriented x4.

## 2012-04-11 NOTE — Anesthesia Procedure Notes (Signed)
Procedure Name: LMA Insertion Date/Time: 04/11/2012 4:15 PM Performed by: Tyrone Nine Pre-anesthesia Checklist: Patient identified, Timeout performed, Emergency Drugs available, Suction available and Patient being monitored Patient Re-evaluated:Patient Re-evaluated prior to inductionOxygen Delivery Method: Circle system utilized Preoxygenation: Pre-oxygenation with 100% oxygen Intubation Type: IV induction Ventilation: Mask ventilation with difficulty LMA: LMA with gastric port inserted LMA Size: 4.0 Number of attempts: 1 Placement Confirmation: positive ETCO2 and breath sounds checked- equal and bilateral Dental Injury: Teeth and Oropharynx as per pre-operative assessment

## 2012-04-11 NOTE — ED Notes (Addendum)
CDU nurse unavailable at the time, to call back when she returns to unit from coronary CT.

## 2012-04-11 NOTE — H&P (Signed)
Family Medicine Teaching Service Admission History and Physical Service Pager: [phone]  Patient name: Adam Ray Medical record number: [id number] Date of birth: 1956-03-15 Age: 56 y.o. Gender: male  Primary Care Provider: [facility], Marylene Land, MD  Chief Complaint: foot pain  Assessment and Plan: Adam Ray is a 56 y.o. year old male presenting with right foot pain for 3 weeks and right foot "callous" for 3 months with x-ray findings concerning for abscess with gas forming organsim given foci of gas now s/p I+D with pus noted.   1. Right foot abscess. WBC and ESR not elevated reassuring for infection being local in nature.  1. Dr. Victorino Dike of [facility] to take to OR for formal I+D after paring of callous showed pus.  2. Will follow up cultures from wound.  3. Will start Vanc and Zosyn post op.  4. If osteomyelitis noted during operation, will need to consider prolonged course-up to 6 weeks of antibiotics. Will consider ID consult. Could consider shorter course if contained to soft tissue.  2. Poorly controlled type II diabetes- last a1c >8.  1. Will place on 1/2 home dose Lantus at 50 units. 2. Hold home metformin 3. Sensitive SSI to start, likely will need moderate. But will be gentle at first given likely dietary changes on carb mod.  3. Hypertension-well controlled on home lasix and quinapril-will continue in house. Continue aspirin. Direct LDL 78 so not currently on statin.  4. History of chest pain in 10/2010-has not recurred. Outpatient stress test was recommended inpatient and patient followed up with Dr. Eden Emms outpatient. Appears patient never completed stress test. Multiple risk factors, may need to attempt to plug back in to Dr. Fabio Bering clinic post hospitalization.  5. Tobacco abuse-7mg  nicotine patch if admitted due to 0.3 ppd.  6. Copd-albuterol prn.  7. Sleep apnea-does not appear uses cpap.  8. CKD Stage II-stable at baseline Cr of 1.1-1.2 will trend 9. FEN/GI: NPO for  now. Carb modified post op.  10. Prophylaxis: Heparin if admitted 11. Disposition: pending orthopedic work up 12. Code Status: full code.   History of Present Illness: Adam Ray is a 56 y.o. year old male presenting with right foot pain.   Patient has noted a "knot" or callous" on the bottom of his right midfoot over the last 3 months slowly progressing in size.  Patient states he has had some pain while walking on it over the last 3 weeks which has been stable. Denies nausea/vomiting/fever/chills/fatigue/overall sick feelings. 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   Patient was sent for x-ray yesterday and findings were concerning for abscess with gas forming organsim given foci of gas.  Family practice called patient to come for evaluation by surgery and potentially by MRI so patient presented to ED today.   EDP did not feel as if surgery consult or MRI were emergent given chronicity. Family medicine was requested for ED consult and spoke with previous orthopedist group by phone with plan for ED evaluation by orthopedic surgeon, Dr Victorino Dike. Dr. Victorino Dike did a paring of the superficial aspect with large amounts of purulent foul smelling pus coming from area. Sent drainage for culture. Decision made to take patient to OR for formal I+D with considerations of partial resection of medial cuneiform given proximity to abscess.   Patient to be admitted to family medicine service after procedure for management of CBGs and antibiotics.   Patient Active Problem  List  Diagnosis  . DIAB W/OPHTH MANIFESTS TYPE II/UNS NOT UNCNTRL  . SICKLE-CELL TRAIT  . ERECTILE DYSFUNCTION  . TOBACCO ABUSE  . ESSENTIAL HYPERTENSION, BENIGN  . VENOUS INSUFFICIENCY  . ALLERGIC RHINITIS  . COPD  . BICUSPID AORTIC VALVE  . LEG EDEMA  . MICROALBUMINURIA  . Sleep apnea  . Visit for well man health check  . Diabetic foot ulcer associated with  type 2 diabetes mellitus  . Back pain  . Smoker's keratosis  . Slurred speech  . Cough   Past Medical History: Past Medical History  Diagnosis Date  . Diabetes mellitus   . COPD (chronic obstructive pulmonary disease)   . Allergy   . Chronic kidney disease   . Bronchitis   . Gout    Past Surgical History: Past Surgical History  Procedure Date  . Surgery left great toe   . Tear ducts bilateral eyes    Social History: History  Substance Use Topics  . Smoking status: Current Every Day Smoker -- 0.3 packs/day for 46 years    Types: Cigars  . Smokeless tobacco: Current User   Comment: wants to use electric cigarettes.  not interested in pills, worried about chantix side effects  . Alcohol Use: No   For any additional social history documentation, please refer to relevant sections of EMR.  Family History: Family History  Problem Relation Age of Onset  . Diabetes Mother   . Stroke Mother   . Heart failure Father    Allergies: No Known Allergies No current facility-administered medications on file prior to encounter.   Current Outpatient Prescriptions on File Prior to Encounter  Medication Sig Dispense Refill  . furosemide (LASIX) 20 MG tablet Take 20 mg by mouth daily.      . insulin glargine (LANTUS) 100 UNIT/ML injection Inject 100 Units into the skin at bedtime.       . metFORMIN (GLUCOPHAGE) 1000 MG tablet Take 1,000 mg by mouth 2 (two) times daily with a meal.       . quinapril (ACCUPRIL) 5 MG tablet Take 0.5 tablets (2.5 mg total) by mouth daily.  30 tablet  11   Review Of Systems: Per HPI  Otherwise 12 point review of systems was performed and was unremarkable.  Physical Exam: BP 123/80  Pulse 80  Temp 97.6 F (36.4 C) (Oral)  Resp 18  SpO2 100% Gen: NAD, resting comfortably in bed HEENT: NCAT, MMM, PERRLA  CV: RRR no mrg  Lungs: CTAB  Abd: soft/nontender/nondistended/normal bowel sounds  MSK: moves all extremities, 1+ pitting edema bilaterally.  Venous stasis changes of bilateral legs Foot exam: s/p 1st ray amputation. 3x3 cm nodule with callous formation on right midfoot. Mildly tender to palpation. No surrounding erythema. No open wound or ulceration. 2+ DP pulses bilaterally.  Skin: warm and dry, no rash  Neuro: CN II-XII intact, sensation and reflexes normal throughout, 5/5 muscle strength in bilateral upper and lower extremities. Normal finger to nose. Normal rapid alternating movements.    Results for orders placed during the hospital encounter of 04/11/12 (from the past 24 hour(s))  GLUCOSE, CAPILLARY     Status: Abnormal   Collection Time   04/11/12  8:11 AM      Component Value Range   Glucose-Capillary 255 (*) 70 - 99 mg/dL   Comment 1 Notify RN    SEDIMENTATION RATE     Status: Normal   Collection Time   04/11/12  8:12 AM  Component Value Range   Sed Rate 9  0 - 16 mm/hr  CBC     Status: Abnormal   Collection Time   04/11/12  8:12 AM      Component Value Range   WBC 6.6  4.0 - 10.5 K/uL   RBC 4.73  4.22 - 5.81 MIL/uL   Hemoglobin 13.8  13.0 - 17.0 g/dL   HCT 16.1  09.6 - 04.5 %   MCV 85.0  78.0 - 100.0 fL   MCH 29.2  26.0 - 34.0 pg   MCHC 34.3  30.0 - 36.0 g/dL   RDW 40.9  81.1 - 91.4 %   Platelets 129 (*) 150 - 400 K/uL  BASIC METABOLIC PANEL     Status: Abnormal   Collection Time   04/11/12  8:12 AM      Component Value Range   Sodium 138  135 - 145 mEq/L   Potassium 4.3  3.5 - 5.1 mEq/L   Chloride 104  96 - 112 mEq/L   CO2 23  19 - 32 mEq/L   Glucose, Bld 269 (*) 70 - 99 mg/dL   BUN 19  6 - 23 mg/dL   Creatinine, Ser 7.82  0.50 - 1.35 mg/dL   Calcium 9.3  8.4 - 95.6 mg/dL   GFR calc non Af Amer 67 (*) >90 mL/min   GFR calc Af Amer 77 (*) >90 mL/min  GLUCOSE, CAPILLARY     Status: Abnormal   Collection Time   04/11/12  9:20 AM      Component Value Range   Glucose-Capillary 242 (*) 70 - 99 mg/dL   Comment 1 Notify RN      Dg Foot Complete Right  04/10/2012  **ADDENDUM** CREATED:  04/10/2012 17:17:15  Findings called to Dr. Domenick Bookbinder on 04/10/2012 at 1718 hours.  **END ADDENDUM** SIGNED BY: Loraine Leriche A. Tyron Russell, M.D.   04/10/2012  *RADIOLOGY REPORT*  Clinical Data: Increased pain at plantar aspect of right foot, swelling/knot at bottom of foot, history diabetes, prior first ray amputation  RIGHT FOOT COMPLETE - 3+ VIEW  Comparison: 07/18/2007  Findings: Interval amputation of the first ray from the first metatarsal distally. Bones appear diffusely demineralized. Joint spaces preserved. No acute fracture or dislocation. Regional soft tissue swelling at foot. Additionally, focal soft tissue swelling with several foci of gas identified at plantar aspect of the midfoot compatible with either an open wound or soft tissue infection/abscess by gas forming organism. No bone destruction seen.  IMPRESSION: Prior amputation of the first ray. Diffuse soft tissue swelling with focal soft tissue swelling and foci of gas at the plantar aspect of the right foot either representing an abscess by a gas forming organism or an open wound.   Original Report Authenticated By: Lollie Marrow, M.D.     Tana Conch, MD, PGY2 04/11/2012 1:03 PM

## 2012-04-11 NOTE — ED Provider Notes (Signed)
History     CSN: [id number]  Arrival date & time 04/11/12  7829   First MD Initiated Contact with Patient 04/11/12 0756      No chief complaint on file.   (Consider location/radiation/quality/duration/timing/severity/associated sxs/prior treatment) HPI  56 y.o. insulin-dependent diabetic male sent by family practice doctor Dr. Tye Savoy for further evaluation of painful soft tissue swelling to patient's plantar right foot. Patient's had house in that area since he amputation of the great toe in 2007. However pain just began 2 months ago, symptoms are not changing rapidly over the last 8 weeks. Patient denies any fever, nausea vomiting. X-ray of the affected area shows a foci of gas.   Past Medical History  Diagnosis Date  . Diabetes mellitus   . COPD (chronic obstructive pulmonary disease)   . Allergy   . Chronic kidney disease   . Bronchitis   . Gout     Past Surgical History  Procedure Date  . Surgery left great toe   . Tear ducts bilateral eyes     Family History  Problem Relation Age of Onset  . Diabetes Mother   . Stroke Mother   . Heart failure Father     History  Substance Use Topics  . Smoking status: Current Every Day Smoker -- 0.3 packs/day for 46 years    Types: Cigars  . Smokeless tobacco: Current User   Comment: wants to use electric cigarettes.  not interested in pills, worried about chantix side effects  . Alcohol Use: No      Review of Systems  Constitutional: Negative for fever.  Respiratory: Negative for shortness of breath.   Cardiovascular: Negative for chest pain.  Gastrointestinal: Negative for nausea, vomiting, abdominal pain and diarrhea.  Skin: Positive for rash.  All other systems reviewed and are negative.    Allergies  Review of patient's allergies indicates no known allergies.  Home Medications   Current Outpatient Rx  Name Route Sig Dispense Refill  . ALBUTEROL SULFATE HFA 108 (90 BASE) MCG/ACT IN AERS Inhalation  Inhale 2 puffs into the lungs every 6 (six) hours as needed for wheezing. 1 Inhaler 1    Dispense with spacer  . ASPIRIN 81 MG PO TABS Oral Take 81 mg by mouth daily.      . BD INSULIN SYR ULTRAFINE II 31G X 5/16" 0.5 ML MISC  USE AS DIRECTED 100 each 3    NEED MORE REFILLS PLEASE  . CETIRIZINE HCL 10 MG PO TABS Oral Take 1 tablet (10 mg total) by mouth daily. 30 tablet 11  . FLUTICASONE PROPIONATE 50 MCG/ACT NA SUSP Nasal Place 2 sprays into the nose daily. 16 g 6  . FUROSEMIDE 20 MG PO TABS Oral Take 20 mg by mouth daily.    [name] GLUCOSE BLOOD VI STRP Other 1 each by Other route 2 (two) times daily. Use as instructed     . INSULIN GLARGINE 100 UNIT/ML [place] SOLN  100 UNITS IN PM. DISPENSE 1 MONTH SUPPLY AS WELL AS NEEDLES AND SYRINGES.    [name] METFORMIN HCL 1000 MG PO TABS Oral Take 1,000 mg by mouth daily.    . QUINAPRIL HCL 5 MG PO TABS Oral Take 0.5 tablets (2.5 mg total) by mouth daily. 30 tablet 11    BP 123/80  Pulse 80  Temp 97.6 F (36.4 C) (Oral)  Resp 18  SpO2 100%  Physical Exam  Nursing note and vitals reviewed. Constitutional: He is oriented to person, place, and  time. He appears well-developed and well-nourished. No distress.  HENT:  Head: Normocephalic.  Eyes: Conjunctivae normal and EOM are normal.  Cardiovascular: Normal rate.   Pulmonary/Chest: Effort normal. No stridor.  Abdominal: Soft. There is no tenderness.  Musculoskeletal: Normal range of motion.  Neurological: He is alert and oriented to person, place, and time.  Skin:       3 cm callous to right plantar foot tender to palpation, no erythema OR fluctuance.   Psychiatric: He has a normal mood and affect.    ED Course  Procedures (including critical care time)  Labs Reviewed  GLUCOSE, CAPILLARY - Abnormal; Notable for the following:    Glucose-Capillary 255 (*)     All other components within normal limits  CBC - Abnormal; Notable for the following:    Platelets 129 (*)     All other components within  normal limits  BASIC METABOLIC PANEL - Abnormal; Notable for the following:    Glucose, Bld 269 (*)     GFR calc non Af Amer 67 (*)     GFR calc Af Amer 77 (*)     All other components within normal limits  GLUCOSE, CAPILLARY - Abnormal; Notable for the following:    Glucose-Capillary 242 (*)     All other components within normal limits  SEDIMENTATION RATE   Dg Foot Complete Right  04/10/2012  **ADDENDUM** CREATED: 04/10/2012 17:17:15  Findings called to Dr. Domenick Bookbinder on 04/10/2012 at 1718 hours.  **END ADDENDUM** SIGNED BY: Loraine Leriche A. Tyron Russell, M.D.   04/10/2012  *RADIOLOGY REPORT*  Clinical Data: Increased pain at plantar aspect of right foot, swelling/knot at bottom of foot, history diabetes, prior first ray amputation  RIGHT FOOT COMPLETE - 3+ VIEW  Comparison: 07/18/2007  Findings: Interval amputation of the first ray from the first metatarsal distally. Bones appear diffusely demineralized. Joint spaces preserved. No acute fracture or dislocation. Regional soft tissue swelling at foot. Additionally, focal soft tissue swelling with several foci of gas identified at plantar aspect of the midfoot compatible with either an open wound or soft tissue infection/abscess by gas forming organism. No bone destruction seen.  IMPRESSION: Prior amputation of the first ray. Diffuse soft tissue swelling with focal soft tissue swelling and foci of gas at the plantar aspect of the right foot either representing an abscess by a gas forming organism or an open wound.   Original Report Authenticated By: Lollie Marrow, M.D.      No diagnosis found.    MDM  This is a family practice patient of Dr. Vladimir Faster. She had recommend he come to the emergency room for further evaluation of this painful cyst to the plantar right foot. She has discussed this with Dr. Tana Conch who I will consult this a.m. Basic labs sent.   Blood work is grossly within normal limits he does have an elevated blood glucose which I will  treat with IV hydration. His sedimentation rate is normal at 9. His symptoms are not rapidly progressing we do not feel an emergent surgery consult or MRI are indicated at this time.  Patient's blood sugar is elevated therefore boluses of fluids are ordered however patient is refusing hydration at this time.  Consult from family practice Dr. Durene Cal appreciated: He will come to evaluate the patient and consult surgery.  Dr. Durene Cal has consulted the surgeon Dr. every 8 who will come to evaluate the patient in several hours. He recommends the patient is n.p.o. and that imaging is deferred  until he is evaluated.   Patient will be transferred to CDU pending surgery consult. Sign out given to PA [facility], PA-C 04/11/12 1247

## 2012-04-11 NOTE — ED Notes (Signed)
Pt resting quietly at the time. Waiting on surgeon to consult. Vital signs stable. Remains alert and oriented x4. Updated on plan of care. No signs of distress noted.

## 2012-04-11 NOTE — ED Notes (Signed)
Notified RN of CBG 242 

## 2012-04-11 NOTE — Preoperative (Signed)
Beta Blockers   Reason not to administer Beta Blockers:Not Applicable 

## 2012-04-11 NOTE — ED Notes (Signed)
FP at the bedside. Pt states "I want to leave." updated on plan of care, pt encouarged to stay until decision is made for follow up care.

## 2012-04-11 NOTE — Consult Note (Signed)
Family Medicine Teaching Service ED CONSULT  Service Pager: [phone]  Patient name: Adam Ray Medical record number: [id number] Date of birth: 08-06-1955 Age: 56 y.o. Gender: male  Primary Care Provider: [facility], Marylene Land, MD  Chief Complaint: foot pain  Assessment and Plan: [name] is a 56 y.o. year old male presenting with right foot pain for 3 weeks and right foot "callous" for 3 months with x-ray findings concerning for abscess with gas forming organsim given foci of gas.   1. Right foot callous/pain with x-ray findings with gas. Spoke with Dr. Victorino Dike of [facility] by phone who viewed images. Suggested doing a debridement to investigate for ulceration. If ulceration present, to perform MRI. Discussed with patient who wanted orthopedic surgeon to perform debridement. Dr. Victorino Dike should be available within 4 hours. Dr. Victorino Dike did not suggest starting IV antibiotics but did request keeping patient NPO until further evaluation.   1. Patient previously seen by [facility] [facility] in 2006 for 4th ray cellulitis and not operated on as no osteomyelitis. Amputation in 2009 by Dr. Luiz Blare. Dr. Victorino Dike currently on call and as his practice has previously seen patient, willing to see patient today. 2. WBC and ESR not elevated reassuring for infection not being widespread.  2. Poorly controlled type II diabetes- last a1c >8. discussed fluid bolus with EDP as well as sliding scale and will continue until ortho evaluation. Would hold home metformin if admitted and place on 1/2 home dose Lantus at 50 units.  3. Hypertension-well controlled on home lasix and quinapril. Continue aspirin. Unclear why patient not on a statin.  4. History of chest pain in 10/2010-has not recurred. Outpatient stress test was recommended inpatient and patient followed up with Dr. Eden Emms outpatient. Appears patient never completed stress test. Multiple risk factors, if patient in need of surgery, may need to discuss with  cardiology given no myoview completed.  5. Tobacco abuse-7mg  nicotine patch if admitted due to 0.3 ppd.  6. Copd-albuterol prn.  7. Sleep apnea-does not appear uses cpap.  8. CKD Stage II-stable at baseline Cr of 1.1-1.2 9. FEN/GI: NPO for now. IVF per EDP.  10. Prophylaxis: Heparin if admitted 11. Disposition: pending orthopedic work up 12. Code Status: full code.   History of Present Illness: Adam Ray is a 56 y.o. year old male presenting with right foot pain.   Patient has noted a "knot" or callous" on the bottom of his right midfoot over the last 3 months slowly progressing in size.  Patient states he has had some pain while walking on it over the last 3 weeks which has been stable. Denies nausea/vomiting/fever/chills/fatigue/overall sick feelings. 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   Patient was sent for x-ray yesterday and findings were concerning for abscess with gas forming organsim given foci of gas.  Family practice called patient to come for evaluation by surgery and potentially by MRI so patient presented to ED today.   EDP did not feel as if surgery consult or MRI were emergent given chronicity. Family medicine was requested for ED consult and spoke with previous orthopedist group by phone with plan for ED evaluation by orthopedic surgeon within next several hours.  or 3 weeks and right foot "callous" for 3 months with x-ray findings   Patient Active Problem List  Diagnosis  . DIAB W/OPHTH MANIFESTS TYPE II/UNS NOT UNCNTRL  . SICKLE-CELL TRAIT  . ERECTILE DYSFUNCTION  .  TOBACCO ABUSE  . ESSENTIAL HYPERTENSION, BENIGN  . VENOUS INSUFFICIENCY  . ALLERGIC RHINITIS  . COPD  . BICUSPID AORTIC VALVE  . LEG EDEMA  . MICROALBUMINURIA  . Sleep apnea  . Visit for well man health check  . Diabetic foot ulcer associated with type 2 diabetes mellitus  . Back pain  . Smoker's keratosis  . Slurred  speech  . Cough   Past Medical History: Past Medical History  Diagnosis Date  . Diabetes mellitus   . COPD (chronic obstructive pulmonary disease)   . Allergy   . Chronic kidney disease   . Bronchitis   . Gout    Past Surgical History: Past Surgical History  Procedure Date  . Surgery left great toe   . Tear ducts bilateral eyes    Social History: History  Substance Use Topics  . Smoking status: Current Every Day Smoker -- 0.3 packs/day for 46 years    Types: Cigars  . Smokeless tobacco: Current User   Comment: wants to use electric cigarettes.  not interested in pills, worried about chantix side effects  . Alcohol Use: No   For any additional social history documentation, please refer to relevant sections of EMR.  Family History: Family History  Problem Relation Age of Onset  . Diabetes Mother   . Stroke Mother   . Heart failure Father    Allergies: No Known Allergies No current facility-administered medications on file prior to encounter.   Current Outpatient Prescriptions on File Prior to Encounter  Medication Sig Dispense Refill  . furosemide (LASIX) 20 MG tablet Take 20 mg by mouth daily.      . insulin glargine (LANTUS) 100 UNIT/ML injection Inject 100 Units into the skin at bedtime.       . metFORMIN (GLUCOPHAGE) 1000 MG tablet Take 1,000 mg by mouth 2 (two) times daily with a meal.       . quinapril (ACCUPRIL) 5 MG tablet Take 0.5 tablets (2.5 mg total) by mouth daily.  30 tablet  11   Review Of Systems: Per HPI  Otherwise 12 point review of systems was performed and was unremarkable.  Physical Exam: BP 123/80  Pulse 80  Temp 97.6 F (36.4 C) (Oral)  Resp 18  SpO2 100% Gen: NAD, resting comfortably in bed HEENT: NCAT, MMM, PERRLA  CV: RRR no mrg  Lungs: CTAB  Abd: soft/nontender/nondistended/normal bowel sounds  MSK: moves all extremities, 1+ pitting edema bilaterally. Venous stasis changes of bilateral legs Foot exam: s/p 1st ray amputation. 3x3  cm nodule with callous formation on right midfoot. Mildly tender to palpation. No surrounding erythema. No open wound or ulceration. 2+ DP pulses bilaterally.  Skin: warm and dry, no rash  Neuro: CN II-XII intact, sensation and reflexes normal throughout, 5/5 muscle strength in bilateral upper and lower extremities. Normal finger to nose. Normal rapid alternating movements.    Results for orders placed during the hospital encounter of 04/11/12 (from the past 24 hour(s))  GLUCOSE, CAPILLARY     Status: Abnormal   Collection Time   04/11/12  8:11 AM      Component Value Range   Glucose-Capillary 255 (*) 70 - 99 mg/dL   Comment 1 Notify RN    SEDIMENTATION RATE     Status: Normal   Collection Time   04/11/12  8:12 AM      Component Value Range   Sed Rate 9  0 - 16 mm/hr  CBC     Status: Abnormal  Collection Time   04/11/12  8:12 AM      Component Value Range   WBC 6.6  4.0 - 10.5 K/uL   RBC 4.73  4.22 - 5.81 MIL/uL   Hemoglobin 13.8  13.0 - 17.0 g/dL   HCT 16.1  09.6 - 04.5 %   MCV 85.0  78.0 - 100.0 fL   MCH 29.2  26.0 - 34.0 pg   MCHC 34.3  30.0 - 36.0 g/dL   RDW 40.9  81.1 - 91.4 %   Platelets 129 (*) 150 - 400 K/uL  BASIC METABOLIC PANEL     Status: Abnormal   Collection Time   04/11/12  8:12 AM      Component Value Range   Sodium 138  135 - 145 mEq/L   Potassium 4.3  3.5 - 5.1 mEq/L   Chloride 104  96 - 112 mEq/L   CO2 23  19 - 32 mEq/L   Glucose, Bld 269 (*) 70 - 99 mg/dL   BUN 19  6 - 23 mg/dL   Creatinine, Ser 7.82  0.50 - 1.35 mg/dL   Calcium 9.3  8.4 - 95.6 mg/dL   GFR calc non Af Amer 67 (*) >90 mL/min   GFR calc Af Amer 77 (*) >90 mL/min  GLUCOSE, CAPILLARY     Status: Abnormal   Collection Time   04/11/12  9:20 AM      Component Value Range   Glucose-Capillary 242 (*) 70 - 99 mg/dL   Comment 1 Notify RN      Dg Foot Complete Right  04/10/2012  **ADDENDUM** CREATED: 04/10/2012 17:17:15  Findings called to Dr. Domenick Bookbinder on 04/10/2012 at 1718 hours.  **END  ADDENDUM** SIGNED BY: Loraine Leriche A. Tyron Russell, M.D.   04/10/2012  *RADIOLOGY REPORT*  Clinical Data: Increased pain at plantar aspect of right foot, swelling/knot at bottom of foot, history diabetes, prior first ray amputation  RIGHT FOOT COMPLETE - 3+ VIEW  Comparison: 07/18/2007  Findings: Interval amputation of the first ray from the first metatarsal distally. Bones appear diffusely demineralized. Joint spaces preserved. No acute fracture or dislocation. Regional soft tissue swelling at foot. Additionally, focal soft tissue swelling with several foci of gas identified at plantar aspect of the midfoot compatible with either an open wound or soft tissue infection/abscess by gas forming organism. No bone destruction seen.  IMPRESSION: Prior amputation of the first ray. Diffuse soft tissue swelling with focal soft tissue swelling and foci of gas at the plantar aspect of the right foot either representing an abscess by a gas forming organism or an open wound.   Original Report Authenticated By: Lollie Marrow, M.D.     Tana Conch, MD, PGY2 04/11/2012 1:03 PM

## 2012-04-12 LAB — BASIC METABOLIC PANEL
BUN: 13 mg/dL (ref 6–23)
Chloride: 107 mEq/L (ref 96–112)
GFR calc Af Amer: 83 mL/min — ABNORMAL LOW (ref >90)
GFR calc non Af Amer: 72 mL/min — ABNORMAL LOW (ref >90)
Glucose, Bld: 138 mg/dL — ABNORMAL HIGH (ref 70–99)
Potassium: 3.9 mEq/L (ref 3.5–5.1)
Sodium: 139 mEq/L (ref 135–145)

## 2012-04-12 LAB — GLUCOSE, CAPILLARY: Glucose-Capillary: 164 mg/dL — ABNORMAL HIGH (ref 70–99)

## 2012-04-12 LAB — CBC
HCT: 38.7 % — ABNORMAL LOW (ref 39.0–52.0)
Hemoglobin: 13.2 g/dL (ref 13.0–17.0)
MCHC: 34.1 g/dL (ref 30.0–36.0)
WBC: 4.8 10*3/uL (ref 4.0–10.5)

## 2012-04-12 MED ORDER — DOXYCYCLINE HYCLATE 50 MG PO CAPS: 100.0000 mg | ORAL_CAPSULE | Freq: Two times a day (BID) | ORAL | Status: DC

## 2012-04-12 MED ORDER — FUROSEMIDE 20 MG PO TABS: 20.0000 mg | ORAL_TABLET | Freq: Every day | ORAL | Status: DC

## 2012-04-12 MED ORDER — HYDROCODONE-ACETAMINOPHEN 5-325 MG PO TABS: 1.0000 | ORAL_TABLET | ORAL | Status: DC | PRN

## 2012-04-12 NOTE — Progress Notes (Signed)
Subjective: 1 Day Post-Op Procedure(s) (LRB): IRRIGATION AND DEBRIDEMENT EXTREMITY (Right) Patient reports pain as mild.   No c/o.  Did well with PT.  Objective: Vital signs in last 24 hours: Temp:  [97.6 F (36.4 C)-98.4 F (36.9 C)] 98 F (36.7 C) (10/13 0642) Pulse Rate:  [53-76] 53  (10/13 0642) Resp:  [12-23] 18  (10/13 0642) BP: (96-128)/(44-83) 96/67 mmHg (10/13 0642) SpO2:  [96 %-100 %] 96 % (10/13 0642) Weight:  [74.9 kg (165 lb 2 oz)] 74.9 kg (165 lb 2 oz) (10/12 1824)  Intake/Output from previous day: 10/12 0701 - 10/13 0700 In: 2225 [P.O.:480; I.V.:1695; IV Piggyback:50] Out: 410 [Urine:400; Blood:10] Intake/Output this shift:     Basename 04/12/12 0628 04/11/12 0812  HGB 13.2 13.8    Basename 04/12/12 0628 04/11/12 0812  WBC 4.8 6.6  RBC 4.55 4.73  HCT 38.7* 40.2  PLT 135* 129*    Basename 04/12/12 0628 04/11/12 0812  NA 139 138  K 3.9 4.3  CL 107 104  CO2 23 23  BUN 13 19  CREATININE 1.11 1.18  GLUCOSE 138* 269*  CALCIUM 8.2* 9.3   No results found for this basename: LABPT:2,INR:2 in the last 72 hours  foot dressed and dry.  Assessment/Plan: 1 Day Post-Op Procedure(s) (LRB): IRRIGATION AND DEBRIDEMENT EXTREMITY (Right) From my perspective, he can go home today in a CAM boot.  F/u with me in a week at the office.  Abx per [facility] team.  Toni Arthurs 04/12/2012, 10:16 AM

## 2012-04-12 NOTE — Consult Note (Signed)
I have seen and examined this patient. I have discussed with Dr Durene Cal.  I agree with their findings and plans as documented in their consultation  note.

## 2012-04-12 NOTE — Progress Notes (Signed)
HOME HEALTH AGENCIES SERVING [place]   Agencies that are Medicare-Certified and are affiliated with [facility] Health Agency  Telephone Number Address  [facility]   The [facility] has ownership interest in this company; however, you are under no obligation to use this agency. [phone] or  [phone] [address] [place], [place] [address]   Agencies that are Medicare-Certified and are not affiliated with [facility] Agency Telephone Number Address  [facility] [phone] Fax [phone] [address] [place], [place]  [address]  [facility] [phone] or [phone] Fax [phone] [address] [place], [place] [address]  [facility] [phone] Fax [phone] [address] [place], [place] [address]  [facility] [phone] Fax [phone] [address] [place], [place]  [address]  [facility] [phone] Fax [phone] [address], Suite [place], [place] [address]  [facility] [phone] [address] [place], [place] [address]  [facility] [phone]  [address] [place], [place] [address]  [facility] [phone] or [phone] Fax [phone] [place], [place]  [address]  [facility] [phone] Fax [phone] [address] [place], [place]  [address]  [facility]  [phone] Fax [phone] [address] [place], [place] [address]               Agencies that are not Medicare-Certified and are not affiliated with [facility] Agency Telephone Number Address  [facility] [place] [phone] or [phone] Fax [phone] [address], [place]  [address]  [facility] [phone] Fax [phone] [address] [place], [place]  [address]  [facility]  [phone] Fax [phone] [address] [place], [place] [address]  [facility] [phone] Fax [phone] [address] [place], [place] [address]  [facility] [phone] or [phone] Fax [phone] [address] [place], [place]  [address]  [facility] [phone] or [phone] Fax [phone] [address], Suite [place], [place]  [address]  [facility] [phone] Fax [phone] [address] [place], [place]  [address]  [facility] [phone] [address] [place], [place]  [address]  [facility] [phone] Fax [phone] [address] [place], [place]  [address]  [facility] [phone] Fax [phone] [address] [place], [place]  [address]  [facility] [phone] Fax [phone] [address] [place], [place] [address]  [facility] [phone] Fax [phone] [address] [place], [place]  [address]       Noted recommendation for home health services. Spoke with patient. Choice of agencies offered. [facility]. Referral will be made.

## 2012-04-12 NOTE — Care Management Note (Signed)
    Page 1 of 1   04/12/2012     10:24:57 AM   CARE MANAGEMENT NOTE 04/12/2012  Patient:  Adam Ray, Adam Ray   Account Number:  [id number]  Date Initiated:  04/12/2012  Documentation initiated by:  Fransico Michael  Subjective/Objective Assessment:   admitted on 04/11/12 with c/o foot pain.     Action/Plan:   prior to admission, patient lived at home and independent with ADLs   Anticipated DC Date:  04/14/2012   Anticipated DC Plan:  HOME W HOME HEALTH SERVICES      DC Planning Services  CM consult      [facility] Choice  HOME HEALTH  DURABLE MEDICAL EQUIPMENT   Choice offered to / List presented to:  C-1 Patient      DME agency  [facility]        [facility] agency  [facility]   Status of service:  In process, will continue to follow Medicare Important Message given?   (If response is "NO", the following Medicare IM given date fields will be blank) Date Medicare IM given:   Date Additional Medicare IM given:    Discharge Disposition:    Per UR Regulation:  Reviewed for med. necessity/level of care/duration of stay  If discussed at Long Length of Stay Meetings, dates discussed:    Comments:  04/12/12-1006-J.Lutricia Horsfall [phone]      56yo male patient admitted on 04/11/12 with c/o foot pain. Taken to surgery for debridement of abcess. Noted need for home health PT and rolling walker for patient. Spoke to patient who reported having had [facility] in the past. Choice of agencies offered to patient again. [facility] chosen. No orders noted in system yet. Sticky note entered for orders. Referral will be made when orders entered. NCM to follow.

## 2012-04-12 NOTE — Progress Notes (Signed)
Utilization Review Completed.  

## 2012-04-12 NOTE — Op Note (Signed)
NAMEJACQUEES, Adam Ray NO.:  [id number]  MEDICAL RECORD NO.:  [id number]  LOCATION:  [id number]                        FACILITY:  [facility]  PHYSICIAN:  Toni Arthurs, MD        DATE OF BIRTH:  March 22, 1956  DATE OF PROCEDURE:  04/11/2012 DATE OF DISCHARGE:                              OPERATIVE REPORT   PREOPERATIVE DIAGNOSIS:  Right plantar foot abscess.  POSTOPERATIVE DIAGNOSIS:  Right plantar foot abscess.  PROCEDURE:  Irrigation and excisional debridement of right plantar foot abscess.  SURGEON:  Toni Arthurs, MD  ANESTHESIA:  General.  ESTIMATED BLOOD LOSS:  Minimal.  TOURNIQUET TIME:  7 minutes with an ankle Esmarch.  COMPLICATIONS:  None apparent.  DISPOSITION:  Extubated, awake, and stable to recovery.  SPECIMEN:  Deep tissue to Microbiology for aerobic and anaerobic culture.  INDICATIONS FOR PROCEDURE:  The patient is a 56 year old male with poorly controlled diabetes and history of cigarette smoking.  He complains of increasing pain over the right foot.  He was found in the ER to have a large callus under his medial cuneiform adjacent to the site of the previous first ray resection.  The callus was pared and an abscess was found deep to the callus.  He presents now for operative treatment of this plantar foot abscess.  He understands the risks and benefits, the alternative treatment options, and elects surgical treatment.  He specifically understands risks of bleeding, infection, nerve damage, blood clots, need for additional surgery, amputation, and death.  PROCEDURE IN DETAIL:  After preoperative consent was obtained and the correct operative site was identified, the patient was brought to the operating room and placed supine on the operating table.  General anesthesia was induced.  Preoperative antibiotics were held.  Surgical time-out was taken.  The right lower extremity was prepped and draped in standard sterile fashion.  The foot was  exsanguinated and a 4-inch Esmarch tourniquet was wrapped around the ankle.  The location of the abscess was identified.  The extremely thickened callus over this area was pared down with a #10 blade.  This uncovered a large area of ulceration that had created an abscess.  The infected material was cut out with the 10 blade sent as a specimen to Microbiology for aerobic and anaerobic culture.  All necrotic material was sharply excised with the scalpel.  Remaining was only healthy viable appearing tissue.  The wound was then irrigated copiously with 3 L of normal saline.  There was no extension below the level of the plantar fascia.  A tourniquet was then released.  The wound areas appeared well perfused.  Hemostasis was achieved.  Dry dressings were applied followed by a compression wrap. The patient was then awakened from anesthesia and transported to the recovery room in stable condition.  FOLLOWUP PLAN:  The patient will be nonweightbearing on his right foot. He is going to have overnight observation with IV antibiotics and then possibly transition to oral antibiotics given the superficial nature of this ulcer.     Toni Arthurs, MD     JH/MEDQ  D:  04/11/2012  T:  04/12/2012  Job:  [id number]

## 2012-04-12 NOTE — Progress Notes (Signed)
Orthopedic Tech Progress Note Patient Details:  Adam Ray 07-Jan-1956 [id number] Cam Walker applied to Right LE. Fitted to patient's comfort. Family present in room with patient. Ortho Devices Type of Ortho Device: CAM walker Ortho Device/Splint Location: Right LE Ortho Device/Splint Interventions: Application   [name] 04/12/2012, 11:46 AM

## 2012-04-12 NOTE — Discharge Summary (Signed)
Physician Discharge Summary  Patient ID: Adam Ray MRN: [id number] DOB/AGE: 57-26-1957 57 y.o.  Admit date: 04/11/2012 Discharge date: 04/12/2012  Admission Diagnoses: Worsening right foot pain  Discharge Diagnoses: Right plantar foot abscess  Discharged Condition: stable  Hospital Course: This is a 56 year old male who presented with right foot pain for 3 weeks and right foot "callous" for 3 months who presented to the clinic on 10/11 for right foot swelling. An x-ray was ordered at that time and when it revealed soft tissue swelling with gas, the patient was advised to come to the ED where he presented on 10/12.  Orthopedics (Dr. Victorino Dike) was consulted in the ED. He was started on vancomycin and Zosyn. He was afebrile with normal vital signs and no leukocytosis. Dr. Victorino Dike performed an I&D on 10/12 where a large abscess was drained and the fluid sent for culture. There was no extension below the level of the plantar fascia.  On POD#1, the patient reported minimal pain and was able to ambulate with assistance. His antibiotics were transitioned to doxycycline, and he was discharged to home with his girlfriend/wife. Per PT recommendation, referral for [facility] PT made prior to discharge.   Follow-up issues: -Follow-up right foot pain and for signs of infection, making sure he is taking antibiotic. He is also to schedule an orthopedic follow-up in 1 week.  -Abscess culture   Consults: Orthopedics  Discharge Exam: Blood pressure 120/64, pulse 56, temperature 98.1 F (36.7 C), temperature source Oral, resp. rate 18, height 5' 7.5" (1.715 m), weight 165 lb 2 oz (74.9 kg), SpO2 98.00%. GEN: NAD; sitting up in a chair PSYCH: fully alert and oriented CV: RRR PULM: NI WOB ABD: soft, NT, distended EXT: right foot covered in bandage; sensation in toes intact with 3-4 capillary refill; no TTP  Disposition: 01-Home or Self Care  Discharge Orders    Future Appointments: Provider: Department: Dept  Phone: Center:   04/17/2012 3:30 PM Shelly Rubenstein, MD [facility] [phone] [facility]   07/22/2012 2:30 PM Sherrie George, MD [facility] [phone] None     Future Orders Please Complete By Expires   Discharge patient          Medication List     As of 04/12/2012  3:20 PM    STOP taking these medications         ibuprofen 200 MG tablet   Commonly known as: ADVIL,MOTRIN      TAKE these medications         albuterol 108 (90 BASE) MCG/ACT inhaler   Commonly known as: PROVENTIL HFA;VENTOLIN HFA   Inhale 2 puffs into the lungs every 6 (six) hours as needed. For shortness of breath      aspirin EC 81 MG tablet   Take 81 mg by mouth daily.      doxycycline 50 MG capsule   Commonly known as: VIBRAMYCIN   Take 2 capsules (100 mg total) by mouth 2 (two) times daily.      furosemide 20 MG tablet   Commonly known as: LASIX   Take 1 tablet (20 mg total) by mouth daily.      HYDROcodone-acetaminophen 5-325 MG per tablet   Commonly known as: NORCO/VICODIN   Take 1-2 tablets by mouth every 4 (four) hours as needed.      insulin glargine 100 UNIT/ML injection   Commonly known as: LANTUS   Inject 100 Units into the skin at bedtime.      metFORMIN  1000 MG tablet   Commonly known as: GLUCOPHAGE   Take 1,000 mg by mouth 2 (two) times daily with a meal.      quinapril 5 MG tablet   Commonly known as: ACCUPRIL   Take 0.5 tablets (2.5 mg total) by mouth daily.           Follow-up Information    Follow up with Toni Arthurs, MD. Call in 1 week.   Contact information:   [address] [place] [address] [phone]       Follow up with [facility], ANGELA, MD. On 04/17/2012. (Keep appointment )    Contact information:   [address] [place] [address] [phone]          Signed: Madolyn Frieze, ANGELA 04/12/2012, 3:21 PM

## 2012-04-12 NOTE — Assessment & Plan Note (Signed)
**   Please note this is a late note:  Unclear etiology of swelling and "spot" on foot.  Sending patient for radiographs of foot.   Will call with results.   Does not appear to be infected, no erythema, no fevers or chills.  Present for several months.

## 2012-04-12 NOTE — Evaluation (Signed)
Physical Therapy Evaluation Patient Details Name: Adam Ray MRN: [id number] DOB: [birth year] Today's Date: 04/12/2012 Time: 4696-2952 PT Time Calculation (min): 34 min  PT Assessment / Plan / Recommendation Clinical Impression  pt presenst with R Plantar Abscess s/p I+D.  pt having difficulty maintaining NWBing on R and needs multiples cues throughout mobility.  Discussed use of crutches vs RW.  pt wanting to try RW next session.      PT Assessment  Patient needs continued PT services    Follow Up Recommendations  Home health PT;Supervision - Intermittent    Does the patient have the potential to tolerate intense rehabilitation      Barriers to Discharge None      Equipment Recommendations  Rolling walker with 5" wheels    Recommendations for Other Services     Frequency Min 5X/week    Precautions / Restrictions Precautions Precautions: Fall Restrictions Weight Bearing Restrictions: Yes RLE Weight Bearing: Non weight bearing   Pertinent Vitals/Pain Denies pain.        Mobility  Bed Mobility Bed Mobility: Supine to Sit;Sitting - Scoot to Edge of Bed Supine to Sit: 5: Supervision;With rails Sitting - Scoot to Edge of Bed: 5: Supervision Transfers Transfers: Sit to Stand;Stand to Sit Sit to Stand: 4: Min guard;With upper extremity assist;From bed Stand to Sit: 4: Min guard;With upper extremity assist;To chair/3-in-1;With armrests Details for Transfer Assistance: cues for use of crutches and UEs, NWBing on R LE.   Ambulation/Gait Ambulation/Gait Assistance: 4: Min guard Ambulation Distance (Feet): 120 Feet Assistive device: Crutches Ambulation/Gait Assistance Details: pt generally unsteady with lateral sway.  pt needs cues to slow down and to maintain NWBing R LE.   Gait Pattern: Step-to pattern Stairs: No Wheelchair Mobility Wheelchair Mobility: No    Shoulder Instructions     Exercises     PT Diagnosis: Difficulty walking  PT Problem List: Decreased  strength;Decreased activity tolerance;Decreased balance;Decreased mobility;Decreased knowledge of use of DME;Decreased knowledge of precautions PT Treatment Interventions: DME instruction;Gait training;Stair training;Functional mobility training;Therapeutic activities;Therapeutic exercise;Balance training;Patient/family education   PT Goals Acute Rehab PT Goals PT Goal Formulation: With patient Time For Goal Achievement: 04/19/12 Potential to Achieve Goals: Good Pt will go Supine/Side to Sit: with modified independence PT Goal: Supine/Side to Sit - Progress: Goal set today Pt will go Sit to Supine/Side: with modified independence PT Goal: Sit to Supine/Side - Progress: Goal set today Pt will go Sit to Stand: with modified independence PT Goal: Sit to Stand - Progress: Goal set today Pt will go Stand to Sit: with modified independence PT Goal: Stand to Sit - Progress: Goal set today Pt will Ambulate: >150 feet;with modified independence;with least restrictive assistive device PT Goal: Ambulate - Progress: Goal set today Pt will Go Up / Down Stairs: 1-2 stairs;with supervision;with least restrictive assistive device PT Goal: Up/Down Stairs - Progress: Goal set today  Visit Information  Last PT Received On: 04/12/12 Assistance Needed: +1    Subjective Data  Subjective: I don't have any pain at all.   Patient Stated Goal: Home   Prior Functioning  Home Living Lives With: Spouse Available Help at Discharge: Family;Available PRN/intermittently (Wife works) Type of Home: House Home Access: Stairs to enter Secretary/administrator of Steps: 1 Entrance Stairs-Rails: None Home Layout: One level Home Adaptive Equipment: Crutches Prior Function Level of Independence: Independent Able to Take Stairs?: Yes Driving: Yes Communication Communication: No difficulties    Cognition  Overall Cognitive Status: Appears within functional limits for tasks assessed/performed  Arousal/Alertness:  Awake/alert Orientation Level: Appears intact for tasks assessed Behavior During Session: [facility] for tasks performed    Extremity/Trunk Assessment Right Lower Extremity Assessment RLE ROM/Strength/Tone: Deficits RLE ROM/Strength/Tone Deficits: pt wrapped at ankle/foot.  pt with history of Great Toe removed.  Hip and Knee WFL.   RLE Sensation: WFL - Light Touch Left Lower Extremity Assessment LLE ROM/Strength/Tone: WFL for tasks assessed LLE Sensation: WFL - Light Touch Trunk Assessment Trunk Assessment: Normal   Balance Balance Balance Assessed: Yes Static Standing Balance Static Standing - Balance Support: Right upper extremity supported;During functional activity Static Standing - Level of Assistance: 5: Stand by assistance Static Standing - Comment/# of Minutes: pt required R UE A while standing to urinate.  pt mildly unsteady.    End of Session PT - End of Session Equipment Utilized During Treatment: Gait belt Activity Tolerance: Patient tolerated treatment well Patient left: in chair;with call bell/phone within reach;with family/visitor present Nurse Communication: Mobility status  GP Functional Assessment Tool Used: Clinical Judgement Functional Limitation: Mobility: Walking and moving around Mobility: Walking and Moving Around Current Status ([id number]): At least 1 percent but less than 20 percent impaired, limited or restricted Mobility: Walking and Moving Around Goal Status [phone]): 0 percent impaired, limited or restricted   Sunny Schlein, [place] [phone] 04/12/2012, 9:24 AM

## 2012-04-13 ENCOUNTER — Encounter (HOSPITAL_COMMUNITY): Payer: Self-pay | Admitting: Orthopedic Surgery

## 2012-04-14 NOTE — Discharge Summary (Signed)
I have seen and examined this patient. I have discussed with Dr Madolyn Frieze.  I agree with their findings and plans as documented in their discharge note.

## 2012-04-15 LAB — CULTURE, ROUTINE-ABSCESS

## 2012-04-16 LAB — ANAEROBIC CULTURE: Gram Stain: NONE SEEN

## 2012-04-17 ENCOUNTER — Ambulatory Visit: Payer: Medicare Other | Admitting: Family Medicine

## 2012-04-27 ENCOUNTER — Ambulatory Visit (INDEPENDENT_AMBULATORY_CARE_PROVIDER_SITE_OTHER): Payer: Medicare Other | Admitting: Family Medicine

## 2012-04-27 ENCOUNTER — Encounter: Payer: Self-pay | Admitting: Family Medicine

## 2012-04-27 ENCOUNTER — Encounter (HOSPITAL_BASED_OUTPATIENT_CLINIC_OR_DEPARTMENT_OTHER): Payer: Medicare Other | Attending: General Surgery

## 2012-04-27 VITALS — BP 125/76 | HR 67 | Temp 98.1°F | Ht 67.5 in | Wt 169.0 lb

## 2012-04-27 DIAGNOSIS — E1139 Type 2 diabetes mellitus with other diabetic ophthalmic complication: Secondary | ICD-10-CM

## 2012-04-27 DIAGNOSIS — Z794 Long term (current) use of insulin: Secondary | ICD-10-CM | POA: Insufficient documentation

## 2012-04-27 DIAGNOSIS — L97529 Non-pressure chronic ulcer of other part of left foot with unspecified severity: Secondary | ICD-10-CM

## 2012-04-27 DIAGNOSIS — R609 Edema, unspecified: Secondary | ICD-10-CM

## 2012-04-27 DIAGNOSIS — L97509 Non-pressure chronic ulcer of other part of unspecified foot with unspecified severity: Secondary | ICD-10-CM

## 2012-04-27 DIAGNOSIS — E785 Hyperlipidemia, unspecified: Secondary | ICD-10-CM

## 2012-04-27 DIAGNOSIS — I10 Essential (primary) hypertension: Secondary | ICD-10-CM | POA: Insufficient documentation

## 2012-04-27 DIAGNOSIS — L03119 Cellulitis of unspecified part of limb: Secondary | ICD-10-CM

## 2012-04-27 DIAGNOSIS — Z79899 Other long term (current) drug therapy: Secondary | ICD-10-CM | POA: Insufficient documentation

## 2012-04-27 DIAGNOSIS — E119 Type 2 diabetes mellitus without complications: Secondary | ICD-10-CM | POA: Insufficient documentation

## 2012-04-27 DIAGNOSIS — L02619 Cutaneous abscess of unspecified foot: Secondary | ICD-10-CM

## 2012-04-27 LAB — LIPID PANEL: LDL Cholesterol: 66 mg/dL (ref 0–99)

## 2012-04-27 NOTE — Patient Instructions (Signed)
For your foot pain: -Try Tylenol 650 mg in the morning -If you still have a lot of pain, you may take Advil  We will refer you to the wound center to get your foot managed  For your diabetes: -Please check your sugars twice a day:   1. In the morning before you eat breakfast   2. After your biggest of the day   Follow-up in 1 month

## 2012-04-27 NOTE — Progress Notes (Signed)
[facility]  NAME:  Adam Ray, BOZZO NO.:  [id number]  MEDICAL RECORD NO.:  [id number]      DATE OF BIRTH:  11-29-1955  PHYSICIAN:  Ardath Sax, M.D.           VISIT DATE:                                  OFFICE VISIT   This is a 56 year old diabetic man who comes to [place] with an ulcer about a centimeter in diameter on the plantar aspect of his right foot.  He was operated on by Dr. Victorino Dike, who debrided the callus and found abscess that went deep into the plantar aspect of the foot and to the fascial layer.  He took him to the operating room and opened all this and debrided it and left to drain and put him on IV vancomycin and Zosyn. He is a longstanding diabetic.  He is on insulin and metformin.  He also takes Accupril for hypertension and he is on hydrocodone for pain.  He is also on Lasix 20 mg a day and doxycycline by mouth.  He was seen in the clinic today and found to have normal vital signs.  His temperature is 98.6.  He weighs 165 pounds.  His blood pressure is 120/64.  We looked at the wound and found it to be very clean following Dr. Laverta Baltimore surgery and we treated it  with Endoform that we put on today and we applied for a Dermagraft to put on hopefully next week.  We continued him on oral doxycycline.     Ardath Sax, M.D.     PP/MEDQ  D:  04/27/2012  T:  04/27/2012  Job:  [id number]

## 2012-04-27 NOTE — Progress Notes (Signed)
  Subjective:    Patient ID: Adam Ray, male    DOB: 1956/03/28, 56 y.o.   MRN: [id number]  HPI # Hospital follow-up after being admitted 10/12-10/13 for right plantar foot abscess He saw surgeon Dr. Victorino Dike last week and was advised to finish course of antibiotics and follow-up in 2 months He has not had follow-up with wound care at home or at the wound center  His pain is about the same as when he left the hospital He takes Advil 750 mg once a day about every other day. He last took it 2 days ago. Yesterday the pain was mild but the pain is worse today because of the cold weather.   Alleviated by: Advil Exacerbated by: walking on that foot (he does not use a walker or a cane); cold weather  Review of Systems Per HPI Denies constipation, nausea Denies fevers/chills Denies numbness of foot  Allergies, medication, past medical history reviewed.      Objective:   Physical Exam GEN: NAD; slurred speech RIGHT FOOT:   1 cm ulcer plantar surface not actively draining but opened (stage 3) without visible muscle or bone   Sensation intact and minimal tenderness   2+ DP pulses    1-2+ pitting pedal edema (none on left)   No calf tenderness or swelling    Assessment & Plan:

## 2012-04-27 NOTE — Assessment & Plan Note (Signed)
Leg edema of right foot. It appears stable and not concerning for infection or DVT. He was advised to continue compressive hose.

## 2012-04-27 NOTE — Assessment & Plan Note (Signed)
It appears stable.  We will refer to Wound Center for management of wound. He will try Tylenol daily to help with pain and then Advil as needed advised to take with food.  He was advised to follow-up in 1 month for follow-up of diabetes and asked to bring log of sugars and given indications to RTC.

## 2012-05-04 ENCOUNTER — Encounter (HOSPITAL_BASED_OUTPATIENT_CLINIC_OR_DEPARTMENT_OTHER): Payer: Medicare Other

## 2012-05-11 ENCOUNTER — Encounter: Payer: Self-pay | Admitting: Home Health Services

## 2012-05-12 ENCOUNTER — Encounter: Payer: Self-pay | Admitting: Home Health Services

## 2012-05-22 ENCOUNTER — Other Ambulatory Visit: Payer: Self-pay | Admitting: Family Medicine

## 2012-05-25 ENCOUNTER — Encounter (HOSPITAL_BASED_OUTPATIENT_CLINIC_OR_DEPARTMENT_OTHER): Payer: Medicare Other

## 2012-05-26 ENCOUNTER — Encounter: Payer: Self-pay | Admitting: Family Medicine

## 2012-05-26 ENCOUNTER — Ambulatory Visit (INDEPENDENT_AMBULATORY_CARE_PROVIDER_SITE_OTHER): Payer: Medicare Other | Admitting: Family Medicine

## 2012-05-26 VITALS — BP 128/76 | HR 71 | Temp 98.4°F | Ht 67.5 in | Wt 169.0 lb

## 2012-05-26 DIAGNOSIS — E1165 Type 2 diabetes mellitus with hyperglycemia: Secondary | ICD-10-CM

## 2012-05-26 DIAGNOSIS — IMO0001 Reserved for inherently not codable concepts without codable children: Secondary | ICD-10-CM

## 2012-05-26 DIAGNOSIS — L02619 Cutaneous abscess of unspecified foot: Secondary | ICD-10-CM

## 2012-05-26 DIAGNOSIS — L03119 Cellulitis of unspecified part of limb: Secondary | ICD-10-CM

## 2012-05-26 NOTE — Patient Instructions (Addendum)
Check sugars 3 times a day: 1. In the morning before you eat  2. After your biggest meal of the day 3. Before bedtime  Follow-up in 2 weeks and bring these numbers with you

## 2012-05-26 NOTE — Assessment & Plan Note (Signed)
Uncontrolled A1c 10/11.  We discussed checking sugars 3 x daily to see if we can optimize on insulin regimen, consider starting Novolog. He does not have any difficulty taking Lantus. Continue metformin Follow-up in 2 weeks to go over numbers.  He was encouraged to take aspirin daily to prevent heart attack and stroke.

## 2012-05-26 NOTE — Assessment & Plan Note (Signed)
Healing well. Now with callus. He declined podiatry referral for callus and toenail trimming (too expensive). He says he would prefer getting pedicure.

## 2012-05-26 NOTE — Progress Notes (Signed)
  Subjective:    Patient ID: Adam Ray, male    DOB: 08/11/1955, 56 y.o.   MRN: [id number]  HPI # Follow-up of right foot He feels like it is doing better He went to the Wound Center. They did not have new recommendations. He will follow-up in a few months.   # Diabetes, last A1c October 8.6 He reports compliance with insulin and metformin. Denies side effects with medications He does not check sugars regularly at home. The last time he checked "a while ago" it was 34. He does not report feeling bad (nausea/lightheaded) when his sugar was in the 60s. He takes aspirin intermittently because of the cost ($6-7 a bottle). ROS: denies chest pain, dyspnea  Review of Systems  Allergies, medication, past medical history reviewed.      Objective:   Physical Exam GEN: NAD; thin CV: RRR, normal S1/S2, no murmurs, ?S4; no carotid bruits PULM: NI WOB; CTAB FEET:   RIGHT FOOT: ulcer is healing well, closed, no drainage but significant callus around site; sensation intact; pedal pulses difficult to palpate; 2+ popliteal pulses, femoral pulses bilaterally; toenails brittle   LEFT FOOT: callus plantar surface first metatarsal area; sensation intact; 2+ pedal pulse; toenails brittle     Assessment & Plan:

## 2012-06-05 ENCOUNTER — Ambulatory Visit (INDEPENDENT_AMBULATORY_CARE_PROVIDER_SITE_OTHER): Payer: Medicare Other | Admitting: Family Medicine

## 2012-06-05 ENCOUNTER — Encounter: Payer: Self-pay | Admitting: Family Medicine

## 2012-06-05 VITALS — BP 135/78 | HR 68 | Temp 98.0°F | Ht 69.0 in | Wt 172.1 lb

## 2012-06-05 DIAGNOSIS — J069 Acute upper respiratory infection, unspecified: Secondary | ICD-10-CM

## 2012-06-05 NOTE — Progress Notes (Signed)
Patient ID: Adam Ray, male   DOB: 09-18-1955, 56 y.o.   MRN: [id number] Subjective:     Adam Ray is a 56 y.o. male who presents for evaluation of symptoms of a URI. Symptoms include congestion and cough described as productive of white sputum. Onset of symptoms was 7 days ago, and has been gradually improving since that time. Treatment to date: cough suppressants and decongestants. He denies CP, SOB and fever. His grand daughter was also sick with similar symptoms.   Review of Systems Pertinent items are noted in HPI.   Objective:    BP 135/78  Pulse 68  Temp 98 F (36.7 C) (Oral)  Ht 5\' 9"  (1.753 m)  Wt 172 lb 2 oz (78.075 kg)  BMI 25.42 kg/m2 General appearance: alert and cooperative, no distress.  Head: Normocephalic, without obvious abnormality, atraumatic Eyes: conjunctivae/corneas clear. PERRL, EOM's intact.  Ears: normal TM's and external ear canals both ears Nose: Nares normal. Septum midline. Mucosa normal. No drainage or sinus tenderness. Throat: lips, mucosa, and tongue normal; teeth and gums normal Lungs: clear to auscultation bilaterally Heart: regular rate and rhythm, S1, S2 normal, no murmur, click, rub or gallop   Assessment:    viral upper respiratory illness   Plan:    Discussed diagnosis and treatment of URI. Suggested symptomatic OTC remedies. Nasal saline spray for congestion. Follow up as needed.

## 2012-06-05 NOTE — Patient Instructions (Addendum)
Adam Ray,  Thank you for coming in to see me today.   Your symptoms are consistent with viral upper respiratory infection.  1. Nasal saline every 4-5 hours 2. Mucinex/guanefesin for cough and congestion 3. Take the doxycyline as prescribed as your surgeon this will also cover any possible bacteria in your upper airways.   Return for worsening symptoms including fever, chest pain, shortness of breath.   Dr. Armen Pickup

## 2012-06-07 DIAGNOSIS — J189 Pneumonia, unspecified organism: Secondary | ICD-10-CM | POA: Insufficient documentation

## 2012-06-07 NOTE — Assessment & Plan Note (Signed)
Your symptoms are consistent with viral upper respiratory infection.  1. Nasal saline every 4-5 hours 2. Mucinex/guanefesin for cough and congestion 3. Take the doxycyline as prescribed as your surgeon this will also cover any possible bacteria in your upper airways.   Return for worsening symptoms including fever, chest pain, shortness of breath.

## 2012-06-10 ENCOUNTER — Ambulatory Visit: Payer: Medicare Other | Admitting: Family Medicine

## 2012-06-25 ENCOUNTER — Ambulatory Visit (INDEPENDENT_AMBULATORY_CARE_PROVIDER_SITE_OTHER): Payer: Medicare Other | Admitting: Family Medicine

## 2012-06-25 ENCOUNTER — Encounter: Payer: Self-pay | Admitting: Family Medicine

## 2012-06-25 VITALS — BP 136/76 | HR 63 | Temp 98.1°F | Ht 69.0 in | Wt 165.0 lb

## 2012-06-25 DIAGNOSIS — IMO0001 Reserved for inherently not codable concepts without codable children: Secondary | ICD-10-CM

## 2012-06-25 DIAGNOSIS — J069 Acute upper respiratory infection, unspecified: Secondary | ICD-10-CM

## 2012-06-25 DIAGNOSIS — F17200 Nicotine dependence, unspecified, uncomplicated: Secondary | ICD-10-CM

## 2012-06-25 DIAGNOSIS — R05 Cough: Secondary | ICD-10-CM

## 2012-06-25 DIAGNOSIS — E1165 Type 2 diabetes mellitus with hyperglycemia: Secondary | ICD-10-CM

## 2012-06-25 DIAGNOSIS — R059 Cough, unspecified: Secondary | ICD-10-CM

## 2012-06-25 MED ORDER — INSULIN GLARGINE 100 UNIT/ML ~~LOC~~ SOLN: SUBCUTANEOUS | Status: DC

## 2012-06-25 MED ORDER — GUAIFENESIN-CODEINE 100-10 MG/5ML PO SYRP: 10.0000 mL | ORAL_SOLUTION | Freq: Three times a day (TID) | ORAL | Status: DC | PRN

## 2012-06-25 MED ORDER — LORATADINE 10 MG PO TABS: 10.0000 mg | ORAL_TABLET | Freq: Every day | ORAL | Status: DC

## 2012-06-25 NOTE — Assessment & Plan Note (Signed)
His sugars remain elevated. They do not seem more elevated with current illness. Remains in the 300s. We will try splitting up Lantus dose to 100 at nighttime and 50 in the morning to help absorption and efficacy. He wanted "something stronger" than Lantus. He may be amenable to trying sliding scale. I recommended he make appointment with Dr. Raymondo Band to discuss options.

## 2012-06-25 NOTE — Patient Instructions (Addendum)
Try Lantus 100 units at nighttime and 50 units in the morning   Make an appointment to see our pharmacist Dr. Raymondo Band to talk about your diabetes  Make an appointment to see Dr. Madolyn Frieze in 1 weeks

## 2012-06-25 NOTE — Assessment & Plan Note (Addendum)
He continues to have cough. He was given a course of doxycycline, but I am not sure if this helped him.  We will try cough medications for now. He does not seem to have an active URI at this time but post-URI symptoms. We will check CXR to rule-out pneumonia or other pulmonary process. Try anti-histamine to help with sneezing. Encouraged smoking cessation; not ready yet but he has cut back because he says it worsens his cough. He does not appear to have COPD flare at this time.  Follow-up in 1 week.

## 2012-06-25 NOTE — Progress Notes (Signed)
  Subjective:    Patient ID: Adam Ray, male    DOB: [birth year], 56 y.o.   MRN: [id number]  HPI Patient has been having congestion, runny nose, and sneezing for the past week.  He denies sick contacts, fevers/chills, nausea/vomiting, diarrhea, sore throat.  He smokes 1 pack per week.   He has tried Nyquil, Mucinex, and Tessalon to no effect.   He is a diabetic and his sugars have been in the 300s, but this was before he was having current symptoms. He is taking 150 units of Lantus at nighttime. He checks sugars once daily, randomly.  ROS: denies dysuria, urinary frequency, excessive thirst   Review of Systems Denies SOB  Allergies, medication, past medical history reviewed.      Objective:   Physical Exam Gen: appears uncomfortable with nasal congestion and rhinorrhea HEENT:   Head: [place]/AT, including sinuses   Eyes: normal conjunctiva without injection or tearing   Ears: TM clear bilaterally with good light reflex and without erythema or air-fluid level   Nose: nasal congestion and clear nasal discharge   Mouth: MMM; no tonsillar adenopathy; no oropharyngeal erythema NECK: swollen submandibular lymph nodes and nodules palpated on right PULM: NI WOB; coarse breath sound occasionally on right that improves with coughing; clear on left; good air movement; no ronchi or wheezing ABD: soft, NT, ND EXT: no edema SKIN: warm, dry, no rash     Assessment & Plan:

## 2012-06-25 NOTE — Assessment & Plan Note (Signed)
He continues to smoke but down to 1 ppw. He is not ready to quit yet.

## 2012-07-03 ENCOUNTER — Ambulatory Visit (INDEPENDENT_AMBULATORY_CARE_PROVIDER_SITE_OTHER): Payer: Medicare Other | Admitting: Pharmacist

## 2012-07-03 ENCOUNTER — Encounter: Payer: Self-pay | Admitting: Pharmacist

## 2012-07-03 VITALS — BP 126/77 | HR 72 | Temp 98.6°F | Ht 68.5 in | Wt 171.5 lb

## 2012-07-03 DIAGNOSIS — F17200 Nicotine dependence, unspecified, uncomplicated: Secondary | ICD-10-CM

## 2012-07-03 DIAGNOSIS — E1165 Type 2 diabetes mellitus with hyperglycemia: Secondary | ICD-10-CM

## 2012-07-03 DIAGNOSIS — IMO0001 Reserved for inherently not codable concepts without codable children: Secondary | ICD-10-CM

## 2012-07-03 MED ORDER — INSULIN ASPART 100 UNIT/ML ~~LOC~~ SOLN: 20.0000 [IU] | Freq: Three times a day (TID) | SUBCUTANEOUS | Status: DC

## 2012-07-03 NOTE — Assessment & Plan Note (Signed)
Patient is long-term smoker who is currently contemplative about smoking.   He is willing to cut down form 4 cigars per day to 2 per day.   Reassess progress toward goal of complete cessation at next visit.

## 2012-07-03 NOTE — Patient Instructions (Addendum)
Change Lantus to 50 units prior to early meal AND 50 units prior to late meal  Start Novolog 20 units prior to early meal and 40 units prior to late meal  Take early doses in your belly and late doses in your legs.   Follow up with me 10/24 at 10:30

## 2012-07-03 NOTE — Progress Notes (Signed)
  Subjective:    Patient ID: Adam Ray, male    DOB: 09/11/1955, 57 y.o.   MRN: [id number]  HPI Patient arrives in good spirits.   He reports being diagnosed with DM since 1990 and has taken lantus insulin for 9 years.   He reports that his sugar is under poor control.    He currently takes his shots with 4mm insulin needles in his legs.   He is willing to consider use of his stomach but is apprehensive about shots in his belly b/c his mother had "knots from the insulin" - We discussed this was likely due to older types of insulin.   Reports eating two meals per day (only has Coffee in the AM).   First meal of the day is 10:00-11:00 AM and second meal of the day is 7:00 PM  Review of Systems     Objective:   Physical Exam  Home glucose readings 68 - 350 with majority of readings > 200.      Assessment & Plan:   Diabetes of 23 yrs duration (since 1990) currently under poor control of blood glucose based on home glucose readings.  Control is suboptimal due to suboptimal insulin regimen. Reports one hypoglycemic event of 68.  Able to verbalize appropriate hypoglycemia management plan. Decreased dose of basal insulin Lantus (insulin glargine) to 50 units twice daily changed dosing times to prior to meals with Novolog 20 units prior to AM meal AND 40 units prior to your PM meal.  Patient will also use insulin injection in belly with early dose AND continue to use legs for PM dose.  May consider longer needles in the near future.   Written patient instructions provided.  Follow up in  Pharmacist Clinic Visit in 3 weeks.   Total time in face to face counseling 35 minutes.  Patient seen with Doris Cheadle. PharmD resident.   Patient is long-term smoker who is currently contemplative about smoking.   He is willing to cut down form 4 cigars per day to 2 per day.   Reassess progress toward goal of complete cessation at next visit.

## 2012-07-03 NOTE — Assessment & Plan Note (Signed)
Diabetes of 23 yrs duration (since 1990) currently under poor control of blood glucose based on home glucose readings.  Control is suboptimal due to suboptimal insulin regimen. Reports one hypoglycemic event of 68.  Able to verbalize appropriate hypoglycemia management plan. Decreased dose of basal insulin Lantus (insulin glargine) to 50 units twice daily changed dosing times to prior to meals with Novolog 20 units prior to AM meal AND 40 units prior to your PM meal.  Patient will also use insulin injection in belly with early dose AND continue to use legs for PM dose.  May consider longer needles in the near future.   Written patient instructions provided.  Follow up in  Pharmacist Clinic Visit in 3 weeks.   Total time in face to face counseling 35 minutes.  Patient seen with Doris Cheadle. PharmD resident.   Patient is long-term smoker who is currently contemplative about smoking.   He is willing to cut down form 4 cigars per day to 2 per day.   Reassess progress toward goal of complete cessation at next visit.

## 2012-07-06 NOTE — Progress Notes (Signed)
Patient ID: Adam Ray, male   DOB: 07/20/1955, 56 y.o.   MRN: [id number] Reviewed: Agree with Dr. Koval's documentation and management. 

## 2012-07-07 ENCOUNTER — Telehealth: Payer: Self-pay | Admitting: Family Medicine

## 2012-07-07 ENCOUNTER — Encounter: Payer: Self-pay | Admitting: Family Medicine

## 2012-07-07 ENCOUNTER — Ambulatory Visit (INDEPENDENT_AMBULATORY_CARE_PROVIDER_SITE_OTHER): Payer: Medicare Other | Admitting: Family Medicine

## 2012-07-07 ENCOUNTER — Ambulatory Visit (HOSPITAL_COMMUNITY)
Admission: RE | Admit: 2012-07-07 | Discharge: 2012-07-07 | Disposition: A | Discharge status: Home / Self Care | Payer: Medicare Other | Source: Ambulatory Visit | Attending: Family Medicine | Admitting: Family Medicine

## 2012-07-07 VITALS — BP 150/92 | HR 75 | Temp 98.4°F | Ht 67.5 in | Wt 169.0 lb

## 2012-07-07 DIAGNOSIS — R059 Cough, unspecified: Secondary | ICD-10-CM

## 2012-07-07 DIAGNOSIS — IMO0001 Reserved for inherently not codable concepts without codable children: Secondary | ICD-10-CM

## 2012-07-07 DIAGNOSIS — R05 Cough: Secondary | ICD-10-CM | POA: Insufficient documentation

## 2012-07-07 DIAGNOSIS — E1165 Type 2 diabetes mellitus with hyperglycemia: Secondary | ICD-10-CM

## 2012-07-07 LAB — CBC WITH DIFFERENTIAL/PLATELET
Basophils Absolute: 0.1 10*3/uL (ref 0.0–0.1)
Basophils Relative: 1 % (ref 0–1)
Eosinophils Relative: 8 % — ABNORMAL HIGH (ref 0–5)
HCT: 40.7 % (ref 39.0–52.0)
MCH: 29 pg (ref 26.0–34.0)
MCHC: 34.9 g/dL (ref 30.0–36.0)
MCV: 83.2 fL (ref 78.0–100.0)
Monocytes Absolute: 0.6 10*3/uL (ref 0.1–1.0)
RDW: 15.1 % (ref 11.5–15.5)

## 2012-07-07 MED ORDER — LEVOFLOXACIN 750 MG PO TABS: 750.0000 mg | ORAL_TABLET | Freq: Every day | ORAL | Status: DC

## 2012-07-07 NOTE — Assessment & Plan Note (Addendum)
He presents today due to persistent cough. Residual cough from URI versus persistent bronchitis versus pneumonia. Cough from ACEi also possibility.  He will get CXR tomorrow. We will discuss results and management from there. Consider prednisone of no signs of infection. ESR negative. CBC pending.  Objectively he appears improved with decreased congestion symptoms. His only symptom at this time seems to be this cough.  He is willing to stop smoking for 3 months. I encouraged him to try since this may help his symptoms.

## 2012-07-07 NOTE — Telephone Encounter (Signed)
Notified CXR concerning for pneumonia. Rx Levaquin sent and patient notified.

## 2012-07-07 NOTE — Progress Notes (Signed)
  Subjective:    Patient ID: Adam Ray, male    DOB: 10/22/1955, 57 y.o.   MRN: [id number]  HPI Follow-up of cough He says it is persistent. It is not worsening. The cough medication with codeine helped some. He thinks he may need an antibiotic. He is taking doxycycline 100 twice daily to help prevent foot infection.  He denies worsening symptoms, including denying cough/fever/decreased oral intake/malaise. He first reported being ill early December. He feels like he never got better since then.  He has not gotten to get a chest x-ray yet. He went and radiology was busy so he left.   Timeline of office visits: 12/06 presented with cough and congestion that had started 7 days prior. He was taking doxycycline already for his foot at that time.  12/26 presented to me with persistent cough as well as congestion; he had significant rhinorrhea at that time  Review of Systems Per HPI  Allergies, medication, past medical history reviewed.      Objective:   Physical Exam Gen: NAD; well-appearing, -appearing HEENT:   Head: [place]/AT   Eyes: normal conjunctiva without injection or tearing   Ears: TM clear bilaterally with good light reflex and without erythema or air-fluid level   Nose: no rhinorrhea, normal turbinates   Mouth: MMM; no tonsillar adenopathy; no oropharyngeal erythema NECK: no LAD PULM: NI WOB; good air movement; occasional coarse breath sound heard right middle lobe; no rales, wheezes, ronchi    Assessment & Plan:

## 2012-07-07 NOTE — Patient Instructions (Signed)
Please get chest x-ray.  Let's check blood work today.  I will call you with results and plan.

## 2012-07-17 ENCOUNTER — Encounter: Payer: Self-pay | Admitting: Family Medicine

## 2012-07-17 ENCOUNTER — Ambulatory Visit (INDEPENDENT_AMBULATORY_CARE_PROVIDER_SITE_OTHER): Payer: Medicare Other | Admitting: Family Medicine

## 2012-07-17 VITALS — BP 124/77 | HR 76 | Temp 98.4°F | Ht 67.5 in | Wt 171.0 lb

## 2012-07-17 DIAGNOSIS — J189 Pneumonia, unspecified organism: Secondary | ICD-10-CM

## 2012-07-17 NOTE — Patient Instructions (Addendum)
Follow-up at your convenience to get the callus removed from your foot   Follow-up in 1 month for your diabetes

## 2012-07-17 NOTE — Progress Notes (Addendum)
  Subjective:    Patient ID: NUH LIPTON, male    DOB: Sep 05, 1955, 57 y.o.   MRN: [id number]  HPI # Follow-up visit for cough and pneumonia His cough has improved since starting Levaquin   Review of Systems Denies fevers, chills, decreased appetite, nausea/vomiting/diarrhea     Objective:   Physical Exam Gen: NAD; thin PSYCH: pleasant, engaged, appropriate to questions CV: RRR, normal S1/S2, no murmurs PULM: NI WOB; CTAB; good air movement; no wheezes, ronchi, coarse breath sounds     Assessment & Plan:

## 2012-07-17 NOTE — Assessment & Plan Note (Signed)
He has been doing better with improved cough since starting Levaquin for LUL infiltrate concerning for pneumonia seen on CXR.

## 2012-07-22 ENCOUNTER — Ambulatory Visit (INDEPENDENT_AMBULATORY_CARE_PROVIDER_SITE_OTHER): Payer: Medicare Other | Admitting: Ophthalmology

## 2012-07-22 NOTE — Addendum Note (Signed)
Addended by: Madolyn Frieze, Marylene Land J on: 07/22/2012 11:55 AM   Modules accepted: Level of Service

## 2012-07-24 ENCOUNTER — Ambulatory Visit (INDEPENDENT_AMBULATORY_CARE_PROVIDER_SITE_OTHER): Payer: Medicare Other | Admitting: Pharmacist

## 2012-07-24 VITALS — Ht 69.0 in | Wt 173.2 lb

## 2012-07-24 DIAGNOSIS — E1165 Type 2 diabetes mellitus with hyperglycemia: Secondary | ICD-10-CM

## 2012-07-24 DIAGNOSIS — IMO0001 Reserved for inherently not codable concepts without codable children: Secondary | ICD-10-CM

## 2012-07-24 NOTE — Progress Notes (Signed)
  Subjective:    Patient ID: Adam Ray, male    DOB: 1956/04/12, 57 y.o.   MRN: [id number]  HPI  Patient arrives accompanied by his wife.   He brings all medications and blood glucose meter to visit.    His meter had incorrect date and time ( this was corrected).   His recent readings range from 61 to > 400 with majority of readings <300.  He continues to be working on healing foot wound with wound care center and Dr. Madolyn Frieze.   He states that some days he does eat lunch.  He is willing to increase the use of his Rapid insulin (Novolog) to improve control  Review of Systems     Objective:   Physical Exam        Assessment & Plan:   Diabetes of many yrs duration currently under fair and improved control of blood glucose based on   Lab Results  Component Value Date   HGBA1C 9.3 07/07/2012    ,home fasting CBG readings of < 300 with rare but concerning low readings. Control is suboptimal due to erratic meal schedule. Denies symptomatic hypoglycemic events.  Able to verbalize appropriate hypoglycemia management plan. Patient was asked to recheck low readings to make sure sample was adequate.  Decreased dose of basal insulin Lantus (insulin glargine) to 40 units twice daily. Increased dose of rapid insulin Novolog (insulin aspart) to 15 units prior to breakfast and lunch if he eats lunch and continue 40 units with evening meal.  Written patient instructions provided.  Follow up in  Pharmacist Clinic Visit in 2-3 weeks.   Total time in face to face counseling  25 minutes.

## 2012-07-24 NOTE — Assessment & Plan Note (Signed)
Diabetes of many yrs duration currently under fair and improved control of blood glucose based on   Lab Results  Component Value Date   HGBA1C 9.3 07/07/2012    ,home fasting CBG readings of < 300 with rare but concerning low readings. Control is suboptimal due to erratic meal schedule. Denies symptomatic hypoglycemic events.  Able to verbalize appropriate hypoglycemia management plan. Patient was asked to recheck low readings to make sure sample was adequate.  Decreased dose of basal insulin Lantus (insulin glargine) to 40 units twice daily. Increased dose of rapid insulin Novolog (insulin aspart) to 15 units prior to breakfast and lunch if he eats lunch and continue 40 units with evening meal.  Written patient instructions provided.  Follow up in  Pharmacist Clinic Visit in 2-3 weeks.   Total time in face to face counseling  25 minutes.

## 2012-07-24 NOTE — Patient Instructions (Addendum)
Increase Metformin to 1000mg  twice daily  Change Lantus to 40 units twice a day.  Novolog 15 units prior to Breakfast,   15 units if you eat lunch,   And 40 units with your evening.   Schedule visit with Pharmacist in 2nd week of Feb for follow up.  Do this on the way out today.

## 2012-07-27 ENCOUNTER — Encounter: Payer: Self-pay | Admitting: Pharmacist

## 2012-07-27 ENCOUNTER — Ambulatory Visit (INDEPENDENT_AMBULATORY_CARE_PROVIDER_SITE_OTHER): Payer: Medicare Other | Admitting: Family Medicine

## 2012-07-27 ENCOUNTER — Encounter: Payer: Self-pay | Admitting: Family Medicine

## 2012-07-27 VITALS — BP 127/72 | HR 72 | Temp 98.4°F | Ht 67.5 in | Wt 176.0 lb

## 2012-07-27 DIAGNOSIS — L84 Corns and callosities: Secondary | ICD-10-CM

## 2012-07-27 MED ORDER — SALICYLIC ACID 40 % EX PADS: MEDICATED_PAD | CUTANEOUS | Status: DC

## 2012-07-27 NOTE — Progress Notes (Signed)
Patient ID: Adam Ray, male   DOB: 1955-09-26, 57 y.o.   MRN: [id number] Reviewed: Agree with Dr. Macky Lower documentation and management.

## 2012-07-27 NOTE — Patient Instructions (Addendum)
Bring your medicine with you to your next visit You should be taking: -Metformin (diabetes) -Insulin (diabetes) -Lasix (fluid pill) 20 mg once a day -aspirin -quinapril (blood pressure medication) -Viagra as needed  For your callus that I just removed, check every day to make sure does not look infected In 2 days, start using the callus pads I sent over to pharmacy  Follow-up in 2 weeks

## 2012-07-28 NOTE — Assessment & Plan Note (Addendum)
Callus on medial plantar aspect of right mid-foot pared down today. Advised patient and wife to check foot regularly for the next week or so to look for signs of infection. Keep clean and dry. Some callus left intact due to history and risk of diabetic foot ulcers. Try Mediplast to help with remaining callus.

## 2012-07-28 NOTE — Progress Notes (Signed)
  Subjective:    Patient ID: Adam Ray, male    DOB: [birth year], 57 y.o.   MRN: [id number]  HPI He is here to get foot callus on right foot pared down.  He had a diabetic foot ulcer on that foot in the past. It has since healed but has built up a callus.   Review of Systems  Allergies, medication, past medical history reviewed.  Smoking status noted.     Objective:   Physical Exam GEN: NAD NEURO: decreased sensation in feet bilaterally  Procedure About 0.5 cm of dry, thickened callus removed from medial plantar aspect of mid-foot with 15-blade scalpel. Left some callus intact due to history of foot ulcer and decreased sensation      Assessment & Plan:

## 2012-07-28 NOTE — Assessment & Plan Note (Signed)
Callus on medial plantar aspect of right mid-foot pared down today. Advised patient and wife to check foot regularly for the next week or so to look for signs of infection. Keep clean and dry. Some callus left intact due to history and risk of diabetic foot ulcers. Try Mediplast to help with remaining callus.  

## 2012-08-12 ENCOUNTER — Ambulatory Visit: Payer: Medicare Other | Admitting: Family Medicine

## 2012-08-13 ENCOUNTER — Ambulatory Visit: Payer: Medicare Other | Admitting: Pharmacist

## 2012-08-17 ENCOUNTER — Ambulatory Visit (INDEPENDENT_AMBULATORY_CARE_PROVIDER_SITE_OTHER): Payer: Self-pay | Admitting: Ophthalmology

## 2012-08-20 ENCOUNTER — Encounter: Payer: Self-pay | Admitting: Family Medicine

## 2012-08-20 ENCOUNTER — Ambulatory Visit (INDEPENDENT_AMBULATORY_CARE_PROVIDER_SITE_OTHER): Payer: Medicare Other | Admitting: Family Medicine

## 2012-08-20 VITALS — BP 128/73 | HR 78 | Ht 69.0 in | Wt 175.0 lb

## 2012-08-20 DIAGNOSIS — M25562 Pain in left knee: Secondary | ICD-10-CM

## 2012-08-20 DIAGNOSIS — E1165 Type 2 diabetes mellitus with hyperglycemia: Secondary | ICD-10-CM

## 2012-08-20 DIAGNOSIS — M25569 Pain in unspecified knee: Secondary | ICD-10-CM

## 2012-08-20 DIAGNOSIS — IMO0001 Reserved for inherently not codable concepts without codable children: Secondary | ICD-10-CM

## 2012-08-20 MED ORDER — MELOXICAM 15 MG PO TABS: 15.0000 mg | ORAL_TABLET | Freq: Every day | ORAL | Status: DC

## 2012-08-20 NOTE — Patient Instructions (Signed)
Check your blood sugar 2 times a day 1. Before you eat breakfast (when you wake up) 2. Right after dinner  LANTUS: Take 50 units with breakfast  NOVOLOG: Take 15 units with breakfast Take 40 units with dinner  Follow-up in 1 week

## 2012-08-21 DIAGNOSIS — M25562 Pain in left knee: Secondary | ICD-10-CM | POA: Insufficient documentation

## 2012-08-21 NOTE — Progress Notes (Signed)
  Subjective:    Patient ID: Adam Ray, male    DOB: 12/13/1955, 57 y.o.   MRN: [id number]  HPI # Diabetes follow-up He checks his sugars in the morning before breakfast and at 6 pm. His sugars are usually 65-86 in the morning. His evening sugars vary since he eats dinner at different times.   He takes 30 units of Novolog at breakfast, 40 units of Novolog and 50 units of Lantus before he goes to bed.   He eats breakfast and dinner but usually skips lunch.   # Right knee pain This has been going on for a long time. He reports the pain is persistent but not necessarily getting better. It swells sometimes.  He does get gout in his feet and takes ibuprofen occasionally for this. He gets flares infrequently.  ROS: denies rash, hot knee  Review of Systems Per HPI Denies fevers, chills Denies nausea, vomiting Denies lightheadedness, weakness Allergies, medication, past medical history reviewed.  Smoking status noted.     Objective:   Physical Exam GEN: NAD; thin; well-appearing CV: RRR PULM: NI WOB RIGHT KNEE: tender along medial joint line; negative McMurrays; full knee extension bilaterally; no obvious swelling; no rash, no erythema, no warmth compared to left  GAIT: mildly antalgic    Assessment & Plan:

## 2012-08-21 NOTE — Assessment & Plan Note (Addendum)
He is not taking his insulin appropriately. We clarified his insulin regimen. He gets hypoglycemic in the mornings because he takes significant Novolog and Lantus before bedtime.  He was advised to take 15 units of Novolog with breakfast and 50 units of Lantus with breakfast and 40 units of Novolog with dinner. Do not take any insulin before bedtime, only take insulin with meals.  Follow-up in 1 week.

## 2012-08-21 NOTE — Assessment & Plan Note (Signed)
He presents with years of persistent left knee pain. Osteoarthritis most likely, however, he does have history of gout. Does not appear acutely inflammed at this time.  -Stop ibuprofen (he was only taking once a day and complained that it only relieved pain for a few hours) and try meloxicam, which he has taken in the past and helped.  -Follow-up if he would like to try glucocorticoid injection.

## 2012-08-27 ENCOUNTER — Ambulatory Visit: Payer: Medicare Other | Admitting: Pharmacist

## 2012-09-01 ENCOUNTER — Telehealth: Payer: Self-pay | Admitting: Family Medicine

## 2012-09-01 ENCOUNTER — Other Ambulatory Visit: Payer: Self-pay | Admitting: Family Medicine

## 2012-09-01 NOTE — Telephone Encounter (Signed)
LMOVM for pt to return call.  Need to know exactly which medications are needed. Fleeger, Maryjo Rochester

## 2012-09-01 NOTE — Telephone Encounter (Signed)
Mr. Flemmer rxs that were refilled for Jan and Feb never got to the pharmacy.  Need all those meds resent to [facility] on [address] and Mr. Vanderloop would like to be called to let him know they were done.

## 2012-09-02 NOTE — Telephone Encounter (Signed)
Pt was here today with wife, asked about the refills, Payton Doughty read message to him.  I was unable to come out and speak with him at the time and he did not want to wait.  He ask that I call [phone] to speak with him.  No answer and no machine.  Will wait for callback and clarification of what meds.  Will also forward to MD to see if she knows what meds he needs. Fleeger, Maryjo Rochester

## 2012-09-03 MED ORDER — METFORMIN HCL 1000 MG PO TABS: 1000.0000 mg | ORAL_TABLET | Freq: Two times a day (BID) | ORAL | Status: DC

## 2012-09-03 MED ORDER — INSULIN GLARGINE 100 UNIT/ML ~~LOC~~ SOLN: SUBCUTANEOUS | Status: DC

## 2012-09-03 MED ORDER — INSULIN ASPART 100 UNIT/ML ~~LOC~~ SOLN: SUBCUTANEOUS | Status: DC

## 2012-09-03 NOTE — Telephone Encounter (Signed)
Please notify Rx for Lantus, Novolog, and metformin sent in. If he needs anything else, let me know please.

## 2012-09-04 ENCOUNTER — Ambulatory Visit: Payer: Medicare Other | Admitting: Family Medicine

## 2012-09-16 ENCOUNTER — Encounter: Payer: Self-pay | Admitting: *Deleted

## 2012-09-21 ENCOUNTER — Ambulatory Visit (INDEPENDENT_AMBULATORY_CARE_PROVIDER_SITE_OTHER): Payer: Medicare HMO | Admitting: Family Medicine

## 2012-09-21 ENCOUNTER — Ambulatory Visit (HOSPITAL_COMMUNITY)
Admission: RE | Admit: 2012-09-21 | Discharge: 2012-09-21 | Disposition: A | Discharge status: Home / Self Care | Payer: Medicare HMO | Source: Ambulatory Visit | Attending: Family Medicine | Admitting: Family Medicine

## 2012-09-21 ENCOUNTER — Encounter: Payer: Self-pay | Admitting: Family Medicine

## 2012-09-21 VITALS — BP 130/85 | HR 65 | Temp 98.2°F | Ht 67.5 in | Wt 181.0 lb

## 2012-09-21 DIAGNOSIS — R05 Cough: Secondary | ICD-10-CM

## 2012-09-21 DIAGNOSIS — E119 Type 2 diabetes mellitus without complications: Secondary | ICD-10-CM | POA: Insufficient documentation

## 2012-09-21 DIAGNOSIS — R911 Solitary pulmonary nodule: Secondary | ICD-10-CM | POA: Insufficient documentation

## 2012-09-21 DIAGNOSIS — R0789 Other chest pain: Secondary | ICD-10-CM | POA: Insufficient documentation

## 2012-09-21 DIAGNOSIS — R059 Cough, unspecified: Secondary | ICD-10-CM

## 2012-09-21 DIAGNOSIS — J449 Chronic obstructive pulmonary disease, unspecified: Secondary | ICD-10-CM | POA: Insufficient documentation

## 2012-09-21 DIAGNOSIS — J4489 Other specified chronic obstructive pulmonary disease: Secondary | ICD-10-CM | POA: Insufficient documentation

## 2012-09-21 DIAGNOSIS — F17200 Nicotine dependence, unspecified, uncomplicated: Secondary | ICD-10-CM | POA: Insufficient documentation

## 2012-09-21 MED ORDER — HYDROCODONE-CHLORPHENIRAMINE 5-4 MG/5ML PO SOLN: 5.0000 mL | Freq: Four times a day (QID) | ORAL | Status: DC | PRN

## 2012-09-21 NOTE — Patient Instructions (Addendum)
Nice to meet you today. We will get a chest X-ray to see if you have a Pneumonia. Once I get the results of this I will call you to let you know. I have given you a prescription for cough medication.

## 2012-09-22 ENCOUNTER — Telehealth: Payer: Self-pay | Admitting: Family Medicine

## 2012-09-22 DIAGNOSIS — R911 Solitary pulmonary nodule: Secondary | ICD-10-CM

## 2012-09-22 NOTE — Telephone Encounter (Signed)
Abnormal will forward to MD. Adam Ray, Adam Ray

## 2012-09-22 NOTE — Assessment & Plan Note (Signed)
Patient with return of cough following treatment of PNA in January. In the office patient was saturating well and afebrile. Plan: given recent PNA, few crackles heard in right lung, and report of fever at home CXR was ordered that revealed no infiltrate, though there was noted to be a persistent nodule in the LUL. Radiologist recommended CT chest to follow this up. Will defer to patient PCP for further evaluation of this issue. Prescribed hydrocodone-chlorpheniramine for cough.

## 2012-09-22 NOTE — Telephone Encounter (Signed)
Patient is calling for results of his chest xray.

## 2012-09-22 NOTE — Progress Notes (Signed)
  Subjective:    Patient ID: Adam Ray, male    DOB: January 18, 1956, 57 y.o.   MRN: [id number]  URI    Patient is a 57 yo male who presents for same day for URI symptoms.  Patient states he thinks he has walking PNA. When asked why, he stated he has been coughing at night with some improvement in cough durin gthe day. Denies productive cough. States his chest is sore when he coughs. Has tried cough syrup OTC with out much help. Has been coughing for 2 weeks. States had fever to 102 recently. States not much SOB.  PMH: had PNA in January 2014, additionally patient is current smoker  Review of Systems see HPI     Objective:   Physical Exam  Constitutional: He appears well-developed and well-nourished.  HENT:  Head: Normocephalic and atraumatic.  Mouth/Throat: Oropharynx is clear and moist. No oropharyngeal exudate.  Cardiovascular: Normal rate, regular rhythm and normal heart sounds.  Exam reveals no gallop and no friction rub.   No murmur heard. Pulmonary/Chest: Effort normal. He exhibits tenderness (minimal tnederness with palpation of bilateral rib cage).  Minimal crackles heard right lower lung field  BP 130/85  Pulse 65  Temp(Src) 98.2 F (36.8 C) (Oral)  Ht 5' 7.5" (1.715 m)  Wt 181 lb (82.101 kg)  BMI 27.91 kg/m2     Assessment & Plan:

## 2012-09-23 ENCOUNTER — Other Ambulatory Visit: Payer: Self-pay | Admitting: Family Medicine

## 2012-09-23 DIAGNOSIS — E1165 Type 2 diabetes mellitus with hyperglycemia: Secondary | ICD-10-CM

## 2012-09-23 MED ORDER — NICOTINE POLACRILEX 4 MG MT GUM: 4.0000 mg | CHEWING_GUM | OROMUCOSAL | Status: DC | PRN

## 2012-09-23 NOTE — Telephone Encounter (Signed)
Notified of abnormal CXR. Patient is amenable to follow-up CT with contrast.  He is interested in quitting smoking. He smokes 1/3 ppd.  -Rx for nicorette gum sent -1-800-QUIT-NOW -Follow-up with me in week.

## 2012-09-23 NOTE — Telephone Encounter (Signed)
Do not take metformin for 1 day prior and 2 days after CT. If he has significantly elevated sugars > 250, advise to call me and let me know.

## 2012-09-24 ENCOUNTER — Other Ambulatory Visit: Payer: Medicare HMO

## 2012-09-24 ENCOUNTER — Telehealth: Payer: Self-pay | Admitting: Family Medicine

## 2012-09-24 DIAGNOSIS — E1165 Type 2 diabetes mellitus with hyperglycemia: Secondary | ICD-10-CM

## 2012-09-24 LAB — BASIC METABOLIC PANEL
CO2: 29 mEq/L (ref 19–32)
Calcium: 9.1 mg/dL (ref 8.4–10.5)
Chloride: 100 mEq/L (ref 96–112)
Glucose, Bld: 366 mg/dL — ABNORMAL HIGH (ref 70–99)
Potassium: 5.1 mEq/L (ref 3.5–5.3)
Sodium: 135 mEq/L (ref 135–145)

## 2012-09-24 NOTE — Telephone Encounter (Signed)
Patient is asking for his CT appt to be changed until after 2:00.  Please call him.

## 2012-09-24 NOTE — Telephone Encounter (Signed)
Gave pt number to call and reschedule. Alyxandria Wentz, Maryjo Rochester

## 2012-09-24 NOTE — Progress Notes (Signed)
BMP DONE TODAY Adam Ray 

## 2012-09-28 ENCOUNTER — Ambulatory Visit (HOSPITAL_COMMUNITY): Payer: Medicare HMO

## 2012-09-28 ENCOUNTER — Ambulatory Visit (HOSPITAL_COMMUNITY)
Admission: RE | Admit: 2012-09-28 | Discharge: 2012-09-28 | Disposition: A | Discharge status: Home / Self Care | Payer: Medicare HMO | Source: Ambulatory Visit | Attending: Family Medicine | Admitting: Family Medicine

## 2012-09-28 DIAGNOSIS — J984 Other disorders of lung: Secondary | ICD-10-CM | POA: Insufficient documentation

## 2012-09-28 DIAGNOSIS — Q619 Cystic kidney disease, unspecified: Secondary | ICD-10-CM | POA: Insufficient documentation

## 2012-09-28 DIAGNOSIS — R911 Solitary pulmonary nodule: Secondary | ICD-10-CM | POA: Insufficient documentation

## 2012-09-28 MED ORDER — IOHEXOL 300 MG/ML  SOLN
80.0000 mL | Freq: Once | INTRAMUSCULAR | Status: AC | PRN
  Administered 2012-09-28: 80 mL via INTRAVENOUS

## 2012-09-29 ENCOUNTER — Telehealth: Payer: Self-pay | Admitting: Family Medicine

## 2012-09-29 MED ORDER — OMEPRAZOLE 20 MG PO CPDR: 20.0000 mg | DELAYED_RELEASE_CAPSULE | Freq: Every day | ORAL | Status: DC

## 2012-09-29 NOTE — Telephone Encounter (Signed)
Notified CT chest without concerning findings  He still endorses cough that has been going on 4-6 weeks Hydrocodone cough syrup not helping  He denies reflux symptoms but try omeprazole for 1 week and then follow up in 1-2 weeks

## 2012-10-19 ENCOUNTER — Ambulatory Visit: Payer: Medicare HMO | Admitting: Family Medicine

## 2012-10-21 ENCOUNTER — Ambulatory Visit: Payer: Medicare HMO | Admitting: Family Medicine

## 2012-10-28 ENCOUNTER — Ambulatory Visit (INDEPENDENT_AMBULATORY_CARE_PROVIDER_SITE_OTHER): Payer: Medicare HMO | Admitting: Family Medicine

## 2012-10-28 ENCOUNTER — Encounter: Payer: Self-pay | Admitting: Family Medicine

## 2012-10-28 VITALS — BP 122/72 | HR 84 | Ht 70.0 in | Wt 168.7 lb

## 2012-10-28 DIAGNOSIS — E1165 Type 2 diabetes mellitus with hyperglycemia: Secondary | ICD-10-CM

## 2012-10-28 DIAGNOSIS — Z0000 Encounter for general adult medical examination without abnormal findings: Secondary | ICD-10-CM

## 2012-10-28 DIAGNOSIS — IMO0001 Reserved for inherently not codable concepts without codable children: Secondary | ICD-10-CM

## 2012-10-28 NOTE — Progress Notes (Signed)
  Subjective:    Patient ID: Adam Ray, male    DOB: August 27, 1955, 57 y.o.   MRN: [id number]  HPI # Diabetes follow-up HgbA1c 9.8 He takes insulin every day he does not drink. If he drinks, he does not take insulin, like this past weekend.  He does not remember where his sugars have been. He did not bring his meter.  ROS: denies paresthesias  # He would like STI testing to prove to his wife that he is not having intercourse with any one else.  He denies penile discharge, fevers, chills, abdominal pain, dysuria.   Review of Systems Per HPI  Allergies, medication, past medical history reviewed.  Smoking status noted. Tobacco     Objective:   Physical Exam GEN: NAD; well-nourished, -appearing PSYCH: appropriate, pleasant CV: RRR PULM: NI WOB; CTAB without w/r/r EXT: no edema    Assessment & Plan:

## 2012-10-28 NOTE — Patient Instructions (Signed)
-  Time of day, sugar value -Time of day, how much insulin you took  Follow-up in 1 week

## 2012-10-29 ENCOUNTER — Telehealth: Payer: Self-pay | Admitting: Family Medicine

## 2012-10-29 LAB — HEPATITIS B SURFACE ANTIGEN: Hepatitis B Surface Ag: NEGATIVE

## 2012-10-29 LAB — HEPATITIS C ANTIBODY, REFLEX: HCV Ab: NEGATIVE

## 2012-10-29 NOTE — Assessment & Plan Note (Signed)
He will follow-up in 1 week with log of blood sugars and insulin use to determine how to maximize insulin regimen. He reports on days he drinks alcohol to not taking insulin because a friend "died" from doing this.

## 2012-10-29 NOTE — Assessment & Plan Note (Signed)
Check HIV, hepatitis B and C>>>negative.

## 2012-10-29 NOTE — Telephone Encounter (Signed)
Notified negative HIV and hepatitis B and C.

## 2012-11-03 NOTE — Addendum Note (Signed)
Addended byFeliz Beam on: 11/03/2012 10:55 AM   Modules accepted: Orders

## 2012-11-09 ENCOUNTER — Ambulatory Visit: Payer: Medicare HMO | Admitting: Family Medicine

## 2012-11-24 ENCOUNTER — Ambulatory Visit (INDEPENDENT_AMBULATORY_CARE_PROVIDER_SITE_OTHER): Payer: Medicare HMO | Admitting: Family Medicine

## 2012-11-24 ENCOUNTER — Encounter: Payer: Self-pay | Admitting: Family Medicine

## 2012-11-24 VITALS — BP 113/63 | HR 70 | Temp 98.4°F | Ht 70.0 in | Wt 169.0 lb

## 2012-11-24 DIAGNOSIS — E1165 Type 2 diabetes mellitus with hyperglycemia: Secondary | ICD-10-CM

## 2012-11-24 DIAGNOSIS — R609 Edema, unspecified: Secondary | ICD-10-CM

## 2012-11-24 DIAGNOSIS — IMO0001 Reserved for inherently not codable concepts without codable children: Secondary | ICD-10-CM

## 2012-11-24 MED ORDER — METFORMIN HCL 1000 MG PO TABS: 1000.0000 mg | ORAL_TABLET | Freq: Two times a day (BID) | ORAL | Status: DC

## 2012-11-24 MED ORDER — INSULIN ASPART 100 UNIT/ML ~~LOC~~ SOLN: SUBCUTANEOUS | Status: DC

## 2012-11-24 MED ORDER — INSULIN GLARGINE 100 UNIT/ML ~~LOC~~ SOLN: SUBCUTANEOUS | Status: DC

## 2012-11-24 MED ORDER — QUINAPRIL HCL 5 MG PO TABS: 2.5000 mg | ORAL_TABLET | Freq: Every day | ORAL | Status: DC

## 2012-11-24 NOTE — Progress Notes (Signed)
  Subjective:    Patient ID: Adam Ray, male    DOB: [birth year], 57 y.o.   MRN: [id number]  HPI # Diabetes follow-up  04/30 A1c 9.8 (8.6-10.7 previously)  Home BS? 1-2 times a day. Usually at nighttime or early morning.  5/27 5:33 am 66 5/22 5:16 am 80 5/22 5:15 am 6 ? 5/19 5:22 am 74 5/14 2:55 am 68 5/13 5:27 pm 84 5/12 11:16 pm 216 5/03 6:43 pm 150 5/02 5:33 am 84 5/01 11:08 pm 475 5/01 8:15 pm 105 5/01 5:30 am 152  When sugars are 60-80s, he gets hot, sweat, and nervous. He eats something then feels better.   Lantus 100 units at nighttime.  Novolog 25 units random (sometimes without food).  Metformin 1000 bid.   Review of Systems Per HPI Denies dyspnea, chest pain  Allergies, medication, past medical history reviewed.  Smoking status noted.     Objective:   Physical Exam GEN: NAD PULM: NI WOB CV: RRR EXT: non pitting pretibial edema    Assessment & Plan:

## 2012-11-24 NOTE — Assessment & Plan Note (Signed)
Hold Lasix for now. He has not been on it for a while, his blood pressure is okay (on the lower side actually), and he has minimal swelling. Last ECHO several years ago, no heart failure.

## 2012-11-24 NOTE — Assessment & Plan Note (Signed)
He is notorious for taking unusual doses of his insulin.  It seems okay what he is on right now so to simplify things, we just changed the times around, although I realize amount and dose unusual/suboptimal. Lantus 100 units AM, Novolog 25 with biggest meal of day, metformin (1000 bid ideally but he takes daily).   Follow-up in 2 weeks.

## 2012-11-24 NOTE — Patient Instructions (Addendum)
Take your Lantus in the morning with your first meal/snack.   Take your Novolog with the biggest meal of the day.   Follow-up in 2 weeks.

## 2012-11-27 ENCOUNTER — Encounter: Payer: Self-pay | Admitting: Family Medicine

## 2012-12-09 ENCOUNTER — Ambulatory Visit: Payer: Medicare HMO | Admitting: Family Medicine

## 2012-12-23 ENCOUNTER — Ambulatory Visit (INDEPENDENT_AMBULATORY_CARE_PROVIDER_SITE_OTHER): Payer: Medicare HMO | Admitting: Family Medicine

## 2012-12-23 ENCOUNTER — Encounter: Payer: Self-pay | Admitting: Family Medicine

## 2012-12-23 VITALS — BP 132/74 | HR 70 | Temp 98.1°F | Wt 162.0 lb

## 2012-12-23 DIAGNOSIS — IMO0001 Reserved for inherently not codable concepts without codable children: Secondary | ICD-10-CM

## 2012-12-23 DIAGNOSIS — R609 Edema, unspecified: Secondary | ICD-10-CM

## 2012-12-23 DIAGNOSIS — E1165 Type 2 diabetes mellitus with hyperglycemia: Secondary | ICD-10-CM

## 2012-12-23 MED ORDER — INSULIN GLARGINE 100 UNIT/ML ~~LOC~~ SOLN: SUBCUTANEOUS | Status: DC

## 2012-12-23 MED ORDER — METFORMIN HCL 1000 MG PO TABS: 1000.0000 mg | ORAL_TABLET | Freq: Two times a day (BID) | ORAL | Status: DC

## 2012-12-23 MED ORDER — INSULIN ASPART 100 UNIT/ML ~~LOC~~ SOLN: SUBCUTANEOUS | Status: DC

## 2012-12-23 NOTE — Assessment & Plan Note (Signed)
Stable

## 2012-12-23 NOTE — Assessment & Plan Note (Signed)
Continue current regimen.  His blood sugars seems labile. He was advised to bring meet next visit. Follow-up 6 weeks; check A1c at that time.

## 2012-12-23 NOTE — Progress Notes (Signed)
  Subjective:    Patient ID: Adam Ray, male    DOB: [birth year], 57 y.o.   MRN: [id number]  HPI # Diabetes follow-up He is taking insulin as directed but he was out of his Lantus for a few days so his sugars were running high on just his Novolog; he has all his insulin now  Home BS?    Highest: 600   Lowest: 60  Review of Systems Denies chest pain, dyspnea  Allergies, medication, past medical history reviewed.  Smoking status noted.     Objective:   Physical Exam GEN: NAD CV: RRR PULM: NI WOB; CTAB EXT: no pitting pretibial edema    Assessment & Plan:

## 2012-12-23 NOTE — Patient Instructions (Addendum)
Follow-up in 6 weeks   Bring your meter with you Put it in your wife's purse the night before  Schedule colonoscopy. Call [facility].

## 2013-01-06 ENCOUNTER — Telehealth: Payer: Self-pay | Admitting: Family Medicine

## 2013-01-06 NOTE — Telephone Encounter (Signed)
Called and gave the phone number to [facility] and instructed patient to call and schedule his screening colonoscopy.Busick, Rodena Medin

## 2013-01-06 NOTE — Telephone Encounter (Signed)
Pt wants to know about his referral for colonscopy.  He wants to know when it is. (it shows on his last visit he was to call [facility] and schedule but he says he was told we would do it) Please advise

## 2013-01-07 ENCOUNTER — Telehealth: Payer: Self-pay | Admitting: Gastroenterology

## 2013-01-07 ENCOUNTER — Other Ambulatory Visit: Payer: Self-pay

## 2013-01-07 NOTE — Telephone Encounter (Signed)
Procedure is in your IN BOX. Thanks

## 2013-01-07 NOTE — Telephone Encounter (Signed)
COLON, RECTAL POLYP: HYPERPLASTIC POLYP. NO ADENOMATOUS CHANGE OR MALIGNANCY IDENTIFIED.  Recall is in for 2018.  No answer on phone.

## 2013-01-07 NOTE — Telephone Encounter (Signed)
Dr Jarold Motto, the note below was taken from the path report in EPIC. I do not have a path letter, but the RECALL is listed as 2018. Informed pt of this and he stated Dr Priscella Mann is requesting a COLON and it is listed in OV from her on 12/23/12. Please advise. Thanks.

## 2013-01-08 NOTE — Telephone Encounter (Signed)
Per Dr Suzzanne Cloud, recall for 2018 is correct. Tried to call pt but phone will not take a message

## 2013-01-14 ENCOUNTER — Encounter: Payer: Self-pay | Admitting: Family Medicine

## 2013-01-14 ENCOUNTER — Ambulatory Visit (INDEPENDENT_AMBULATORY_CARE_PROVIDER_SITE_OTHER): Payer: Medicare HMO | Admitting: Family Medicine

## 2013-01-14 ENCOUNTER — Other Ambulatory Visit (HOSPITAL_COMMUNITY)
Admission: RE | Admit: 2013-01-14 | Discharge: 2013-01-14 | Disposition: A | Discharge status: Home / Self Care | Payer: Medicare HMO | Source: Ambulatory Visit | Attending: Family Medicine | Admitting: Family Medicine

## 2013-01-14 VITALS — BP 127/78 | HR 59 | Ht 70.0 in | Wt 166.0 lb

## 2013-01-14 DIAGNOSIS — Z202 Contact with and (suspected) exposure to infections with a predominantly sexual mode of transmission: Secondary | ICD-10-CM

## 2013-01-14 DIAGNOSIS — Z113 Encounter for screening for infections with a predominantly sexual mode of transmission: Secondary | ICD-10-CM | POA: Insufficient documentation

## 2013-01-14 DIAGNOSIS — Z2089 Contact with and (suspected) exposure to other communicable diseases: Secondary | ICD-10-CM

## 2013-01-14 NOTE — Progress Notes (Signed)
  Subjective:    Patient ID: Adam Ray, male    DOB: 1956-02-26, 57 y.o.   MRN: [id number]  HPI  57 year old M presenting for STD testing at the request of his wife. He presents with his wife. She is concerned that he is cheating on her, because she has vaginal itching after intercourse. Therefore, she went to he see her physician yesterday for STD testing, but she cannot remember what tests were done. Per our records came in with this same complaint in 10/28/12, at which time he was tested for HIV, Hepatitis B and Hepatitis C. These were all negative and the patient was notified of this on 10/29/12. When asked why he should be tested again, they both stated that they did not trust the results because "they came back too fast and real tests can't come back that fast." Additionally, his wife states that the previous doctor who called to share the results spoke only to Adam Ray, and she did not believe that he was telling the truth. She was also confused about the definition of a negative versus a positive test. Therefore, I provided a detailed explanation of the testing and what certain results signify. When asked directly, Adam Ray denied that he was at risk for contracting STD's and denied having intercourse with anyone aside from his wife. He also denied penile discharge, genital lesions, genital swelling, rashes on palms/soles and dysuria.   When I explained that is has not had any other sexual partners or using IV drugs, then he does not need to be tested for HIV again less than 3 months after his previous tests. He then states the he only wants the tests that his wife wants him to have. When I asked her, she continued to perseverate on vaginal itching, and then they began to argue about the cause of the symptoms and whether or not he was telling the truth. After numerous attempts to redirect the patient and his wife, I was asked the patient if we could review her lab results to help guide her in  deciding what she thought might be causing her symptoms. Therefore, we reviewed the results together in the room. Finally, I suggested that we check him for gonorrhea and chlamydia in his urine, to which they were both agreeable. They were also both agreeable to looking at the results together.   PMH: gonorrhea in the past in his 20's, treated at the time of infection   Review of Systems     Objective:   Physical Exam BP 127/78  Pulse 59  Ht 5\' 10"  (1.778 m)  Wt 166 lb (75.297 kg)  BMI 23.82 kg/m2  No performed as patient denied any problems       Assessment & Plan:  I spent greater than 20 minutes directly counseling the patient and his wife. Unfortunately, due to low health literacy and marital strife, there was lots of confusion and disagreement. However, given that he was HIV negative 3 months ago, and her test from yesterday was HIV negative and he denies any risk for contracting the disease, it was decided not to repeat this test. However, I did recommend a urine gonorrhea and chlamydia test, since this result was not yet back for his wife. They were in agreement with this plan.

## 2013-01-15 ENCOUNTER — Encounter: Payer: Self-pay | Admitting: Family Medicine

## 2013-01-28 ENCOUNTER — Encounter: Payer: Self-pay | Admitting: Family Medicine

## 2013-01-28 ENCOUNTER — Ambulatory Visit (INDEPENDENT_AMBULATORY_CARE_PROVIDER_SITE_OTHER): Payer: Medicare HMO | Admitting: Family Medicine

## 2013-01-28 VITALS — BP 112/74 | HR 61 | Temp 98.0°F | Ht 70.0 in | Wt 164.0 lb

## 2013-01-28 DIAGNOSIS — IMO0001 Reserved for inherently not codable concepts without codable children: Secondary | ICD-10-CM

## 2013-01-28 DIAGNOSIS — E1165 Type 2 diabetes mellitus with hyperglycemia: Secondary | ICD-10-CM

## 2013-01-28 DIAGNOSIS — E11319 Type 2 diabetes mellitus with unspecified diabetic retinopathy without macular edema: Secondary | ICD-10-CM | POA: Insufficient documentation

## 2013-01-28 DIAGNOSIS — M79609 Pain in unspecified limb: Secondary | ICD-10-CM

## 2013-01-28 LAB — POCT GLYCOSYLATED HEMOGLOBIN (HGB A1C): Hemoglobin A1C: 9.3

## 2013-01-28 NOTE — Patient Instructions (Addendum)
It was great to see you again today!  Please check your blood sugar every morning, afternoon, and before you go to bed. Schedule an appointment with Dr. Raymondo Band in pharmacy clinic to work on your diabetes.  For the leg pain, try taking some ibuprofen. You can do 400mg  once daily as needed. If it is not improving, come back to be seen so that we can try a different kind of medicine.  For the questions about colonoscopy, call [facility] office.  On your way out, schedule an appointment one morning to come back for fasting labs. Do not eat or drink anything other than water the morning of your lab appointment until after your labs are drawn.  Come back to see me in 3 months.  Be well, Dr. Pollie Meyer

## 2013-01-28 NOTE — Progress Notes (Signed)
Patient ID: Adam Ray, male   DOB: 1955/09/16, 57 y.o.   MRN: [id number]   HPI:  Diabetes follow up: currently taking Lantus 100 units every morning. Also takes novolog 25 units with meals, but doesn't always do it with every meal. Also taking metformin 1000mg  BID. Takes quinapril 2.5mg  daily for renal protection. Not consistently checking blood sugars, hasn't checked them over the last week.  Leg pain: Has pain in his right leg, in the calf and foot primarily. The pain is not acute, has been present for some time. Has had prior amputation in his right foot for osteomyelitis. Has not tried taking much medicine for the pain.  Colonoscopy: Pt would like to have colonoscopy every 5 years, asking if we can get that set up for him.  ROS: See HPI  PMFSH: smokes cigars, says one pack lasts him 3 days  PHYSICAL EXAM: BP 112/74  Pulse 61  Temp(Src) 98 F (36.7 C) (Oral)  Ht 5\' 10"  (1.778 m)  Wt 164 lb (74.39 kg)  BMI 23.53 kg/m2 Gen: NAD Heart: RRR Lungs: CTAB, normal respiratory effort Neuro: nonfocal grossly, speech intact Ext: bilateral lower extremities with chronic venous stasis changes, 1+ pitting edema. Posterior R calf is nontender. S/p ray amputation of R great toe. No erythema or warmth in bilateral calves. Calves equal size. DP pulses very faint.  ASSESSMENT/PLAN:  # Health maintenance:  -Patient requesting colonoscopy because he "wants it every 5 years". It appears that from Dr. Celene Skeen last office visit with pt in late June, she instructed him to call [facility] to set up a colonoscopy. There is a phone note in epic from Dr. Jarold Motto of [facility] dated 01/07/13 which documents that patient does not need colonoscopy callback until 2018. I explained this to patient, but he continues to insist that he get one every 5 years. Encouraged him to call his GI doctor's office and discuss his concerns regarding repeat colonoscopy with them, since they are the ones that would be doing the  procedure.  # Leg pain: -unclear etiology, pain is vague in description, exam significant only for venous stasis changes. Pulses are somewhat faint. Advised low dose ibuprofen (given mild renal disease, creatinine 1.2) for what is most likely MSK pain versus diabetic peripheral neuropathy. DVT very unlikely given absence of erythema, warmth, tenderness on exam, and calves are same size. Pt does have hx of osteomyelitis, however I am less concerned about this as he has no point tenderness, is afebrile, and is generally well appearing. Will treat conservatively and do trial of ibuprofen and if not improving, follow up for additional management. Next step would likely be to try gabapentin. Since we are referring pt to pharmacy clinic for diabetes management, I will also ask that pharmacy clinic perform ABI's to assess blood flow to pt's legs.

## 2013-01-29 ENCOUNTER — Ambulatory Visit (INDEPENDENT_AMBULATORY_CARE_PROVIDER_SITE_OTHER): Payer: Medicare HMO | Admitting: Family Medicine

## 2013-01-29 ENCOUNTER — Encounter: Payer: Self-pay | Admitting: Family Medicine

## 2013-01-29 VITALS — BP 126/78 | HR 60 | Ht 70.0 in | Wt 166.0 lb

## 2013-01-29 DIAGNOSIS — Z20828 Contact with and (suspected) exposure to other viral communicable diseases: Secondary | ICD-10-CM

## 2013-01-29 DIAGNOSIS — Z113 Encounter for screening for infections with a predominantly sexual mode of transmission: Secondary | ICD-10-CM

## 2013-01-29 DIAGNOSIS — Z202 Contact with and (suspected) exposure to infections with a predominantly sexual mode of transmission: Secondary | ICD-10-CM

## 2013-01-29 LAB — HIV ANTIBODY (ROUTINE TESTING W REFLEX): HIV: NONREACTIVE

## 2013-01-29 LAB — RPR

## 2013-01-29 NOTE — Progress Notes (Signed)
Patient ID: Adam Ray, male   DOB: [birth year], 57 y.o.   MRN: [id number]   HPI:  STD testing: Patient presents with his wife to discuss STD testing. I just saw him yesterday afternoon in clinic for follow up of chronic medical conditions, however he also had this visit scheduled. Patient would like information on what STD's are out there in the world and to be tested on them. Denies any genital symptoms (penile discharge, lesions, etc). Denies having any new sexual partners. Recently saw Dr. Clinton Sawyer, who had an extensive discussion with patient and his wife regarding STD risk exposures. At that visit had testing for gc/chlamydia, which was negative. Also had HIV antibody drawn in late April 2014, which was negative. Hasn't had RPR in several years. Denies history of herpes.   ROS: See HPI - no penile lesions, discharge  PMFSH: no hx of herpes  PHYSICAL EXAM: BP 126/78  Pulse 60  Ht 5\' 10"  (1.778 m)  Wt 166 lb (75.297 kg)  BMI 23.82 kg/m2 Gen: NAD Heart: RRR Lungs: CTAB GU: patient declines exam Neuro: grossly nonfocal, speech intact  ASSESSMENT/PLAN:  Explained common STD's to patient and screening practices. It appears that there is a lot of mistrust and suspicion between patient and wife, with concerns about infidelity and exposure to STD's. Discussed with patient that he recently underwent testing for gc/chlamydia, which was negative. Since he is not having symptoms suggestive of these and has no new sexual partners, there is no indication to repeat this test. Will check RPR since he has not had this in several years. While it is not likely to be positive, I will repeat HIV antibody today since it has been 3 months since his last screening and he theoretically could have acquired acute HIV which was not detected by HIV antibody back in April. If these tests are negative, patient will not need STD screening for at least six months unless he or his wife have a new sexual partner. If he  re-presents in the next several months requesting screening and has no new risk factors, I will recommend a social work evaluation to meet with him and his wife to further explore these issues.

## 2013-01-29 NOTE — Patient Instructions (Signed)
It was great to see you again today!  We are testing you for HIV and syphilis today. I will call you if your test results are not normal.  Otherwise, I will send you a letter.  If you do not hear from me with in 2 weeks please call our office.      Be well, Dr. Randye Lobo Sex Safe sex is about reducing the risk of giving or getting a sexually transmitted disease (STD). STDs are spread through sexual contact involving the genitals, mouth, or rectum. Some STDS can be cured and others cannot. Safe sex can also prevent unintended pregnancies.  SAFE SEX PRACTICES  Limit your sexual activity to only one partner who is only having sex with you.  Talk to your partner about their past partners, past STDs, and drug use.  Use a condom every time you have sexual intercourse. This includes vaginal, oral, and anal sexual activity. Both females and males should wear condoms during oral sex. Only use latex or polyurethane condoms and water-based lubricants. Petroleum-based lubricants or oils used to lubricate a condom will weaken the condom and increase the chance that it will break. The condom should be in place from the beginning to the end of sexual activity. Wearing a condom reduces, but does not completely eliminate, your risk of getting or giving a STD. STDs can be spread by contact with skin of surrounding areas.  Get vaccinated for hepatitis B and HPV.  Avoid alcohol and recreational drugs which can affect your judgement. You may forget to use a condom or participate in high-risk sex.  For females, avoid douching after sexual intercourse. Douching can spread an infection farther into the reproductive tract.  Check your body for signs of sores, blisters, rashes, or unusual discharge. See your caregiver if you notice any of these signs.  Avoid sexual contact if you have symptoms of an infection or are being treated for an STD. If you or your partner has herpes, avoid sexual contact when  blisters are present. Use condoms at all other times.  See your caregiver for regular screenings, examinations, and tests for STDs. Before having sex with a new partner, each of you should be screened for STDs and talk about the results with your partner. BENEFITS OF SAFE SEX   There is less of a chance of getting or giving an STD.  You can prevent unwanted or unintended pregnancies.  By discussing safer sex concerns with your partner, you may increase feelings of intimacy, comfort, trust, and honesty between the both of you. Document Released: 07/25/2004 Document Revised: 03/11/2012 Document Reviewed: 12/09/2011 [facility] Patient Information 2014 [place], [place].   Sexually Transmitted Disease Sexually transmitted disease (STD) refers to any infection that is passed from person to person during sexual activity. This may happen by way of saliva, semen, blood, vaginal mucus, or urine. Common STDs include:  Gonorrhea.  Chlamydia.  Syphilis.  HIV/AIDS.  Genital herpes.  Hepatitis B and C.  Trichomonas.  Human papillomavirus (HPV).  Pubic lice. CAUSES  An STD may be spread by bacteria, virus, or parasite. A person can get an STD by:  Sexual intercourse with an infected person.  Sharing sex toys with an infected person.  Sharing needles with an infected person.  Having intimate contact with the genitals, mouth, or rectal areas of an infected person. SYMPTOMS  Some people may not have any symptoms, but they can still pass the infection to others. Different STDs have different symptoms. Symptoms  include:  Painful or bloody urination.  Pain in the pelvis, abdomen, vagina, anus, throat, or eyes.  Skin rash, itching, irritation, growths, or sores (lesions). These usually occur in the genital or anal area.  Abnormal vaginal discharge.  Penile discharge in men.  Soft, flesh-colored skin growths in the genital or anal area.  Fever.  Pain or bleeding during sexual  intercourse.  Swollen glands in the groin area.  Yellow skin and eyes (jaundice). This is seen with hepatitis. DIAGNOSIS  To make a diagnosis, your caregiver may:  Take a medical history.  Perform a physical exam.  Take a specimen (culture) to be examined.  Examine a sample of discharge under a microscope.  Perform blood tests.  Perform a Pap test, if this applies.  Perform a colposcopy.  Perform a laparoscopy. TREATMENT   Chlamydia, gonorrhea, trichomonas, and syphilis can be cured with antibiotic medicine.  Genital herpes, hepatitis, and HIV can be treated, but not cured, with prescribed medicines. The medicines will lessen the symptoms.  Genital warts from HPV can be treated with medicine or by freezing, burning (electrocautery), or surgery. Warts may come back.  HPV is a virus and cannot be cured with medicine or surgery.However, abnormal areas may be followed very closely by your caregiver and may be removed from the cervix, vagina, or vulva through office procedures or surgery. If your diagnosis is confirmed, your recent sexual partners need treatment. This is true even if they are symptom-free or have a negative culture or evaluation. They should not have sex until their caregiver says it is okay. HOME CARE INSTRUCTIONS  All sexual partners should be informed, tested, and treated for all STDs.  Take your antibiotics as directed. Finish them even if you start to feel better.  Only take over-the-counter or prescription medicines for pain, discomfort, or fever as directed by your caregiver.  Rest.  Eat a balanced diet and drink enough fluids to keep your urine clear or pale yellow.  Do not have sex until treatment is completed and you have followed up with your caregiver. STDs should be checked after treatment.  Keep all follow-up appointments, Pap tests, and blood tests as directed by your caregiver.  Only use latex condoms and water-soluble lubricants during  sexual activity. Do not use petroleum jelly or oils.  Avoid alcohol and illegal drugs.  Get vaccinated for HPV and hepatitis. If you have not received these vaccines in the past, talk to your caregiver about whether one or both might be right for you.  Avoid risky sex practices that can break the skin. The only way to avoid getting an STD is to avoid all sexual activity.Latex condoms and dental dams (for oral sex) will help lessen the risk of getting an STD, but will not completely eliminate the risk. SEEK MEDICAL CARE IF:   You have a fever.  You have any new or worsening symptoms. Document Released: 09/07/2002 Document Revised: 09/09/2011 Document Reviewed: 09/14/2010 [facility] Patient Information 2014 [place], [place].

## 2013-01-31 NOTE — Assessment & Plan Note (Addendum)
A1c today 9.3. Diabetes remains poorly controlled. I am unable to safely titrate pt's insulin since he hasn't been checking his blood sugars regularly. Discussed with him today the need to improve control of his diabetes as he already shows signs of diabetic neuropathy, has some mild elevation in his creatinine, and has hx of osteomyelitis requiring amputation. Advised that pt check his sugars three times daily, write down the numbers, and bring them with him to his next appointment. Asked him to schedule an appointment in pharmacy clinic to discuss diabetes regimen in greater detail.  Cardiac: on aspirin, not on statin. Will check lipids at fasting lab visit. Renal: on ACE inhibitor for renal protection. BP good today. Check CMET (add LFT's since pt hasn't had in several years) Eye: hx diabetic retinopathy per retinal scan May 2014 (bilateral moderate nonproliferative diabetic retinopathy), needs follow up imaging in November 2014 Foot: not due for monofilament exam until Oct 2014 per health maintenance flowsheet

## 2013-02-02 ENCOUNTER — Other Ambulatory Visit: Payer: Medicare HMO

## 2013-02-03 ENCOUNTER — Encounter: Payer: Self-pay | Admitting: Family Medicine

## 2013-02-11 ENCOUNTER — Ambulatory Visit: Payer: Medicare HMO | Admitting: Pharmacist

## 2013-02-17 ENCOUNTER — Telehealth: Payer: Self-pay | Admitting: Family Medicine

## 2013-02-17 NOTE — Telephone Encounter (Signed)
No answer at # given.

## 2013-02-17 NOTE — Telephone Encounter (Signed)
Pt wants to know STD test results

## 2013-04-06 ENCOUNTER — Ambulatory Visit (INDEPENDENT_AMBULATORY_CARE_PROVIDER_SITE_OTHER): Payer: Medicare HMO | Admitting: *Deleted

## 2013-04-06 DIAGNOSIS — IMO0001 Reserved for inherently not codable concepts without codable children: Secondary | ICD-10-CM

## 2013-04-06 DIAGNOSIS — E1165 Type 2 diabetes mellitus with hyperglycemia: Secondary | ICD-10-CM

## 2013-04-06 DIAGNOSIS — Z23 Encounter for immunization: Secondary | ICD-10-CM

## 2013-04-06 NOTE — Progress Notes (Signed)
DIABETES Pt came in to have a retinal scan per diabetic care.   Image was taken and submitted to [facility]-DR. Garg for reading.    Results will be available in 1-2 weeks.  Results will be given to PCP for review and to contact patient.  Jordi Lacko  

## 2013-04-09 ENCOUNTER — Encounter: Payer: Self-pay | Admitting: Family Medicine

## 2013-04-22 ENCOUNTER — Ambulatory Visit: Payer: Medicare HMO | Admitting: Family Medicine

## 2013-04-27 ENCOUNTER — Telehealth: Payer: Self-pay | Admitting: Family Medicine

## 2013-04-27 ENCOUNTER — Ambulatory Visit (HOSPITAL_COMMUNITY)
Admission: RE | Admit: 2013-04-27 | Discharge: 2013-04-27 | Disposition: A | Discharge status: Home / Self Care | Payer: Medicare HMO | Source: Ambulatory Visit | Attending: Family Medicine | Admitting: Family Medicine

## 2013-04-27 ENCOUNTER — Ambulatory Visit (INDEPENDENT_AMBULATORY_CARE_PROVIDER_SITE_OTHER): Payer: Medicare HMO | Admitting: Family Medicine

## 2013-04-27 ENCOUNTER — Encounter: Payer: Self-pay | Admitting: Family Medicine

## 2013-04-27 VITALS — BP 120/74 | HR 71 | Ht 70.0 in | Wt 166.9 lb

## 2013-04-27 DIAGNOSIS — M79604 Pain in right leg: Secondary | ICD-10-CM | POA: Insufficient documentation

## 2013-04-27 DIAGNOSIS — M79609 Pain in unspecified limb: Secondary | ICD-10-CM | POA: Insufficient documentation

## 2013-04-27 DIAGNOSIS — R29898 Other symptoms and signs involving the musculoskeletal system: Secondary | ICD-10-CM | POA: Insufficient documentation

## 2013-04-27 DIAGNOSIS — M545 Low back pain, unspecified: Secondary | ICD-10-CM | POA: Insufficient documentation

## 2013-04-27 MED ORDER — MELOXICAM 15 MG PO TABS: 15.0000 mg | ORAL_TABLET | Freq: Every day | ORAL | Status: DC

## 2013-04-27 NOTE — Assessment & Plan Note (Signed)
Assessment: ddx includes sciatica which is unlikely given normal X-ray of lumbar spine and lack any neurologic findings on exam, peripheral neuropathy from diabetes is known and may be cause, also patient with positive FABER, so may be SI joint issue Plan: - Try antiinflammatory mobic 15 mg daily for 1 month and f/u

## 2013-04-27 NOTE — Progress Notes (Signed)
  Subjective:    Patient ID: Adam Ray, male    DOB: 11/08/1955, 57 y.o.   MRN: [id number]  HPI  57 year old M who presents for right leg pain.   Pain in right leg, starts in lower back and radiates to ankle Duration - 8 months Frequency - every few days Hx of trama - no Associated symptoms - weakness Denies perineal anesthesia, bowel or bladder incontinence, falls  PMH - peripheral neuropathy with hx of toe amputation on right   Review of Systems See HPI     Objective:   Physical Exam BP 120/74  Pulse 71  Ht 5\' 10"  (1.778 m)  Wt 166 lb 14.4 oz (75.705 kg)  BMI 23.95 kg/m2  Back:  Appearance: sciolosis no Palpation: tenderness of paraspinal muscles no, spinous process no; pelvis no    Hip:  Appearance: Palpation: tenderness of greater trochanter no Rotation Reduced: internal no, external yes FADIR: normal FABER: limited due to pain and stiffness  Neuro: Strength hip flexion 5/5, hip abduction 5/5, hip adduction 5/5,  knee extension 5/5, knee flexion 5/5, dorsiflexion 5/5, plantar flexion 5/5 Reflexes: patella 1/2 Bilateral  Achilles 1/2 Bilateral Straight Leg Raise: negative Sensation to light touch intact: yes   X-ray ordered and reviewed   Dg Lumbar Spine 2-3 Views  04/27/2013   CLINICAL DATA:  Right leg weakness and pain, no injury  EXAM: LUMBAR SPINE - 2-3 VIEW  COMPARISON:  CT abdomen pelvis of 06/04/1999 and  FINDINGS: The lumbar vertebrae remain in normal alignment. No compression deformity is seen. Minimal anterior osteophyte formation is noted particularly at L3-4 but disc spaces are relatively well preserved. The SI joints appear corticated.  IMPRESSION: Normal alignment with normal disc spaces.   Electronically Signed   By: Dwyane Dee M.D.   On: 04/27/2013 16:25        Assessment & Plan:

## 2013-04-27 NOTE — Telephone Encounter (Signed)
Please let patient know that X-ray of back is completely normal.

## 2013-04-27 NOTE — Patient Instructions (Signed)
[name],   I am worried about a condition called sciatica, which is irritation of a nerve in your back that runs down your leg. Please get an X-ray of the lower back and start taking an anti-inflammatory daily called Meloxicam.   Please follow up with Dr. Pollie Meyer in 1 month.   Sincerely,   Dr. Clinton Sawyer

## 2013-04-29 NOTE — Telephone Encounter (Signed)
Unable to leave message on home phone and cell phone. Waiting for call back. Please tell pt, that x-rays were normal. Thank you. Lorenda Hatchet, Jaymarion Trombly Pt called back and informed of x-ray results.   Lorenda Hatchet, Renato Battles

## 2013-05-20 ENCOUNTER — Ambulatory Visit (INDEPENDENT_AMBULATORY_CARE_PROVIDER_SITE_OTHER): Payer: Medicare HMO | Admitting: Family Medicine

## 2013-05-20 ENCOUNTER — Encounter: Payer: Self-pay | Admitting: Family Medicine

## 2013-05-20 VITALS — BP 119/79 | HR 76 | Ht 70.0 in | Wt 167.0 lb

## 2013-05-20 DIAGNOSIS — L97529 Non-pressure chronic ulcer of other part of left foot with unspecified severity: Secondary | ICD-10-CM

## 2013-05-20 DIAGNOSIS — E1165 Type 2 diabetes mellitus with hyperglycemia: Secondary | ICD-10-CM

## 2013-05-20 DIAGNOSIS — IMO0001 Reserved for inherently not codable concepts without codable children: Secondary | ICD-10-CM

## 2013-05-20 DIAGNOSIS — L97509 Non-pressure chronic ulcer of other part of unspecified foot with unspecified severity: Secondary | ICD-10-CM

## 2013-05-20 DIAGNOSIS — F528 Other sexual dysfunction not due to a substance or known physiological condition: Secondary | ICD-10-CM

## 2013-05-20 LAB — POCT GLYCOSYLATED HEMOGLOBIN (HGB A1C): Hemoglobin A1C: 8.6

## 2013-05-20 MED ORDER — ALBUTEROL SULFATE HFA 108 (90 BASE) MCG/ACT IN AERS: 2.0000 | INHALATION_SPRAY | Freq: Four times a day (QID) | RESPIRATORY_TRACT | Status: DC | PRN

## 2013-05-20 MED ORDER — SILDENAFIL CITRATE 100 MG PO TABS: ORAL_TABLET | ORAL | Status: DC

## 2013-05-20 NOTE — Patient Instructions (Addendum)
It was great to see you again today!  I am referring you to the wound care center for your foot. I want you to come back in 3-4 weeks so we can talk about your diabetes in greater detail. Please schedule this appointment on the way out. I refilled the viagra and albuterol for you.  If you develop worsening signs of infection in your foot, fevers, or feel ill please return to the clinic or go to the emergency room.  Be well, Dr. Pollie Meyer

## 2013-05-20 NOTE — Progress Notes (Addendum)
Patient ID: Adam Ray, male   DOB: 1956/05/31, 57 y.o.   MRN: [id number]  HPI:  Diabetic Foot wound: has hx of ray amputation in the past on his R foot. Now has a new wound on the medial aspect of his left foot overlying the first metatarsal.  This began about 2 weeks ago, but he has worked on caring for it at home and he thinks it is actually doing better. He has not been to his orthopedist lately, and doesn't know the name of that physician. His last ortho visit was years ago.   Diabetes mellitus: checks sugars and gets lows of 68's and up in the morning. He gets up to the 400s in the evenings. He takes Lantus 100 units at night. He also gives himself insulin novolog ixed with the Lantus, states that a pharmacist told him he could do this safely. I advised him not to do this.  Erectile dysfunction - requests refill of viagra, states this has worked well for him in the past and he is out.   ROS: See HPI  PMFSH: hx DM, prior foot amputation  PHYSICAL EXAM: BP 119/79  Pulse 76  Ht 5\' 10"  (1.778 m)  Wt 167 lb (75.751 kg)  BMI 23.96 kg/m2 Gen: NAD, well appearing HEENT: NCAT Heart: RRR Lungs: CTAB Neuro: grossly nonfocal, speech intact Ext: L foot with charcot deformity. Large callous present overlying/just distal to quarter-sized area of skin breakdown with partial re-epithelialization. No gross fluctuance or drainage. 2+ DP pulse on L foot. No erythema or tracking up skin.  ASSESSMENT/PLAN:  # Diabetic foot wound: does not appear actively infected at this time, actually is healing well -large callous overlying wound removed via sharp dissection using a scalpel by Dr. Leveda Anna -treated with antibiotic ointment and dressing as well -will refer to diabetic wound center for further wound care -return precautions per AVS  # Med refills - refilled viagra, albuterol at pt's request  See problem based charting for additional assessment/plan.  FOLLOW UP: F/u in 3-4 weeks to adjust  diabetic meds and f/u on other aspects of DM routine care (did not have time to do this today as the wound was the main focus of our visit)  [name], MD [facility]

## 2013-05-25 NOTE — Assessment & Plan Note (Signed)
Pt reports good results with viagra, refilled today

## 2013-05-26 ENCOUNTER — Encounter: Payer: Self-pay | Admitting: Home Health Services

## 2013-05-26 DIAGNOSIS — E11339 Type 2 diabetes mellitus with moderate nonproliferative diabetic retinopathy without macular edema: Secondary | ICD-10-CM | POA: Insufficient documentation

## 2013-05-26 DIAGNOSIS — E113399 Type 2 diabetes mellitus with moderate nonproliferative diabetic retinopathy without macular edema, unspecified eye: Secondary | ICD-10-CM | POA: Insufficient documentation

## 2013-05-26 NOTE — Addendum Note (Signed)
Addended by: Oneal Grout on: 05/26/2013 02:54 PM   Modules accepted: Orders

## 2013-06-01 ENCOUNTER — Ambulatory Visit: Payer: Medicare HMO | Admitting: Family Medicine

## 2013-06-01 ENCOUNTER — Telehealth: Payer: Self-pay | Admitting: Family Medicine

## 2013-06-01 NOTE — Telephone Encounter (Signed)
Pt is calling to see if he has an appointment yet for the foot doctor. jw

## 2013-06-04 NOTE — Telephone Encounter (Signed)
[phone] Pt wants to know when is his appt is for the foot center

## 2013-06-28 ENCOUNTER — Encounter (HOSPITAL_BASED_OUTPATIENT_CLINIC_OR_DEPARTMENT_OTHER): Payer: Medicare HMO | Attending: General Surgery

## 2013-06-28 DIAGNOSIS — S98119A Complete traumatic amputation of unspecified great toe, initial encounter: Secondary | ICD-10-CM | POA: Insufficient documentation

## 2013-06-28 DIAGNOSIS — E1169 Type 2 diabetes mellitus with other specified complication: Secondary | ICD-10-CM | POA: Insufficient documentation

## 2013-06-28 DIAGNOSIS — J4489 Other specified chronic obstructive pulmonary disease: Secondary | ICD-10-CM | POA: Insufficient documentation

## 2013-06-28 DIAGNOSIS — E1139 Type 2 diabetes mellitus with other diabetic ophthalmic complication: Secondary | ICD-10-CM | POA: Insufficient documentation

## 2013-06-28 DIAGNOSIS — J449 Chronic obstructive pulmonary disease, unspecified: Secondary | ICD-10-CM | POA: Insufficient documentation

## 2013-06-28 DIAGNOSIS — E11319 Type 2 diabetes mellitus with unspecified diabetic retinopathy without macular edema: Secondary | ICD-10-CM | POA: Insufficient documentation

## 2013-06-28 DIAGNOSIS — Z79899 Other long term (current) drug therapy: Secondary | ICD-10-CM | POA: Insufficient documentation

## 2013-06-28 DIAGNOSIS — I10 Essential (primary) hypertension: Secondary | ICD-10-CM | POA: Insufficient documentation

## 2013-06-28 DIAGNOSIS — L84 Corns and callosities: Secondary | ICD-10-CM | POA: Insufficient documentation

## 2013-06-28 DIAGNOSIS — Z794 Long term (current) use of insulin: Secondary | ICD-10-CM | POA: Insufficient documentation

## 2013-06-28 DIAGNOSIS — N289 Disorder of kidney and ureter, unspecified: Secondary | ICD-10-CM | POA: Insufficient documentation

## 2013-06-28 DIAGNOSIS — L97509 Non-pressure chronic ulcer of other part of unspecified foot with unspecified severity: Secondary | ICD-10-CM | POA: Insufficient documentation

## 2013-06-29 NOTE — Progress Notes (Signed)
[facility]  NAME:  [name], [name] NO.:  [id number]  MEDICAL RECORD NO.:  [id number]      DATE OF BIRTH:  1955/10/19  PHYSICIAN:  Ardath Sax, M.D.           VISIT DATE:                                  OFFICE VISIT   Adam Ray is a 57 year old, African American male with type 2 diabetic with COPD, hypertension, and renal insufficiency.  He has already had a right first toe amputation, because of the gangrenous toe.  He on being examined here today, he was found to have a blood pressure 121/70, pulse 64, respirations 17, his blood sugar was 91.  He weighs 165 pounds.  He takes Proventil, aspirin, Lasix, NovoLog insulin, Lantus insulin, metformin, Accupril, and Viagra are his only meds.  He has also had some diabetic retinopathy and has had some laser surgery.  He comes to [place] today with a diabetic foot ulcer, which I graded as a Wagner 2 on his left foot, it is on the MP area of the plantar aspect of his foot.  It is about a centimeter in diameter.  He also has a large callus on the mid-right foot, which I debrided down of just hypertrophic callus.  I did the same to the MP area of his right foot, and I am going to treat it with collagen and we offloaded him with felt tape to his feet.  He already has diabetic shoes.  He will come back here in a week.  DIAGNOSES:  Diabetic foot ulcer, left foot and large callus of the right foot.  He will be classified as a Wagner 2.  He is also hypertensive, diabetic, and has a history of renal failure.  He will continue his regular medicines, and we will use collagen on his wound and see him next week.     Ardath Sax, M.D.     PP/MEDQ  D:  06/28/2013  T:  06/29/2013  Job:  [id number]

## 2013-06-30 ENCOUNTER — Telehealth: Payer: Self-pay | Admitting: Family Medicine

## 2013-06-30 NOTE — Telephone Encounter (Signed)
Please call patient back and ask him to have the shoe supplier fax back the shoe forms again. These forms are typically fraudulent so I do not fill them out unless directly contacted by the patient. If his supplier re-sends the form, I will be happy to fill it out for him.  Latrelle Dodrill, MD

## 2013-06-30 NOTE — Telephone Encounter (Signed)
Pt came by stating that he need fax returned regarding his diabetic shoes and stated that he needs it sent asap.Adam Ray

## 2013-07-05 ENCOUNTER — Encounter (HOSPITAL_BASED_OUTPATIENT_CLINIC_OR_DEPARTMENT_OTHER): Payer: Medicare HMO | Attending: General Surgery

## 2013-07-05 DIAGNOSIS — E1169 Type 2 diabetes mellitus with other specified complication: Secondary | ICD-10-CM | POA: Insufficient documentation

## 2013-07-05 DIAGNOSIS — G589 Mononeuropathy, unspecified: Secondary | ICD-10-CM | POA: Insufficient documentation

## 2013-07-05 DIAGNOSIS — L97509 Non-pressure chronic ulcer of other part of unspecified foot with unspecified severity: Secondary | ICD-10-CM | POA: Insufficient documentation

## 2013-07-08 ENCOUNTER — Telehealth: Payer: Self-pay | Admitting: *Deleted

## 2013-07-08 NOTE — Telephone Encounter (Signed)
No answer and no machine.  Please have pt make a fasting lab appt to check cholesterol.  Future orders in. Marcene Laskowski, Salome Spotted

## 2013-08-10 ENCOUNTER — Ambulatory Visit (INDEPENDENT_AMBULATORY_CARE_PROVIDER_SITE_OTHER): Payer: Self-pay | Admitting: General Surgery

## 2013-08-12 ENCOUNTER — Telehealth: Payer: Self-pay | Admitting: *Deleted

## 2013-08-12 DIAGNOSIS — IMO0001 Reserved for inherently not codable concepts without codable children: Secondary | ICD-10-CM

## 2013-08-12 DIAGNOSIS — E1165 Type 2 diabetes mellitus with hyperglycemia: Principal | ICD-10-CM

## 2013-08-12 MED ORDER — INSULIN ASPART 100 UNIT/ML ~~LOC~~ SOLN: SUBCUTANEOUS | Status: DC

## 2013-08-12 MED ORDER — INSULIN GLARGINE 100 UNIT/ML ~~LOC~~ SOLN
SUBCUTANEOUS | Status: DC
Start: 2013-08-12 — End: 2013-09-01

## 2013-08-12 NOTE — Telephone Encounter (Addendum)
Patient here in office today requesting Novolog and Lantus samples.  Patient states he is out of is Lantus and will soon be out of Novolog.  Unable to afford his insulin this month (costs around $480).  States he went to [facility] and they are unable to assist him because he is insured Passenger transport manager).  Spoke with Dr. Inda Merlin to give insulin samples.    Med ordered and documented with lot #/exp. date in Epic.  Nolene Ebbs, RN

## 2013-08-13 ENCOUNTER — Encounter: Payer: Self-pay | Admitting: Cardiovascular Disease

## 2013-08-13 ENCOUNTER — Ambulatory Visit (INDEPENDENT_AMBULATORY_CARE_PROVIDER_SITE_OTHER): Payer: Medicare HMO | Admitting: Cardiovascular Disease

## 2013-08-13 VITALS — BP 124/92 | HR 56 | Ht 70.0 in | Wt 160.0 lb

## 2013-08-13 DIAGNOSIS — M79604 Pain in right leg: Secondary | ICD-10-CM

## 2013-08-13 DIAGNOSIS — M79609 Pain in unspecified limb: Secondary | ICD-10-CM

## 2013-08-13 DIAGNOSIS — I739 Peripheral vascular disease, unspecified: Secondary | ICD-10-CM

## 2013-08-13 NOTE — Patient Instructions (Signed)
  We will see you back in follow up after the test.   Dr Gwenlyn Found has ordered  Lower extremity dopplers

## 2013-08-13 NOTE — Assessment & Plan Note (Signed)
Patient complains of right greater than left lower extremity claudication. He's had her right foot ray resection in the past what sounds like osteomyelitis. He was referred by Dr. Judene Companion for decreased pedal pulses. I'm going to get lower extremity arterial Doppler studies and we'll see him back in the office for further evaluation.

## 2013-08-13 NOTE — Progress Notes (Signed)
08/13/2013 Adam Ray   [birth year]  [id number]  Primary Physician Chrisandra Netters, MD Primary Cardiologist: Lorretta Harp MD Renae Gloss   HPI:  [name] is a 58 year old mildly overweight married African American male father of 3 children father to 6 grandchildren who is disabled. He was referred by Dr. Judene Companion from the [facility] care center were decreased pedal pulses and right lower extremity claudication. He has had a right ray resection of his right foot for what sounds like osteomyelitis in the past. His cardiac risk factor profile is remarkable for tobacco abuse, diabetes and hypertension. His father did die of an MI at age 82. He's never had a heart attack or stroke and denies chest pain or shortness of breath. There is a question of a bicuspid aortic valve .  Current Outpatient Prescriptions  Medication Sig Dispense Refill  . albuterol (PROVENTIL HFA;VENTOLIN HFA) 108 (90 BASE) MCG/ACT inhaler Inhale 2 puffs into the lungs every 6 (six) hours as needed. For shortness of breath  18 g  1  . aspirin EC 81 MG tablet Take 81 mg by mouth daily.      . furosemide (LASIX) 20 MG tablet TAKE 1 TABLET BY MOUTH EVERY DAY MAY TAKE UP TO 2 PER DAY  30 tablet  7  . insulin aspart (NOVOLOG) 100 UNIT/ML injection 25 units with your biggest meal of the day. Do not take if you do not eat that day.  1 vial  0  . insulin glargine (LANTUS) 100 UNIT/ML injection 100 units in the AM with breakfast.  50 mL  0  . metFORMIN (GLUCOPHAGE) 1000 MG tablet Take 1,000 mg by mouth daily with breakfast.      . quinapril (ACCUPRIL) 5 MG tablet Take 0.5 tablets (2.5 mg total) by mouth daily.  30 tablet  11  . sildenafil (VIAGRA) 100 MG tablet TAKE 1 TABLET 1 HOUR BEFORE SEXUAL RELATIONS  5 tablet  1  . [DISCONTINUED] levofloxacin (LEVAQUIN) 750 MG tablet Take 1 tablet (750 mg total) by mouth daily. For 14 days  14 tablet  0   No current facility-administered medications for this visit.     No Known Allergies  History   Social History  . Marital Status: Married    Spouse Name: N/A    Number of Children: N/A  . Years of Education: N/A   Occupational History  . Not on file.   Social History Main Topics  . Smoking status: Current Every Day Smoker -- 0.30 packs/day for 48 years    Types: Cigars    Start date: 07/02/1963  . Smokeless tobacco: Current User     Comment: wants to use electric cigarettes.  not interested in pills, worried about chantix side effects  . Alcohol Use: No  . Drug Use: No  . Sexual Activity: Not on file   Other Topics Concern  . Not on file   Social History Narrative  . No narrative on file     Review of Systems: General: negative for chills, fever, night sweats or weight changes.  Cardiovascular: negative for chest pain, dyspnea on exertion, edema, orthopnea, palpitations, paroxysmal nocturnal dyspnea or shortness of breath Dermatological: negative for rash Respiratory: negative for cough or wheezing Urologic: negative for hematuria Abdominal: negative for nausea, vomiting, diarrhea, bright red blood per rectum, melena, or hematemesis Neurologic: negative for visual changes, syncope, or dizziness All other systems reviewed and are otherwise negative except as noted above.  Blood pressure 124/92, pulse 56, height 5\' 10"  (1.778 m), weight 160 lb (72.576 kg).  General appearance: alert and no distress Neck: no adenopathy, no carotid bruit, no JVD, supple, symmetrical, trachea midline and thyroid not enlarged, symmetric, no tenderness/mass/nodules Lungs: clear to auscultation bilaterally Heart: regular rate and rhythm, S1, S2 normal, no murmur, click, rub or gallop Extremities: extremities normal, atraumatic, no cyanosis or edema and 2+ posterior tibial pulse on the right, absent dorsalis pedis, 2+ pedal pulses on the left.  2+ femoral pulses without bruits.  EKG not performed today  ASSESSMENT AND PLAN:   Right leg  pain Patient complains of right greater than left lower extremity claudication. He's had her right foot ray resection in the past what sounds like osteomyelitis. He was referred by Dr. Judene Companion for decreased pedal pulses. I'm going to get lower extremity arterial Doppler studies and we'll see him back in the office for further evaluation.      Lorretta Harp MD FACP,FACC,FAHA, [facility] 08/13/2013 4:47 PM

## 2013-08-18 ENCOUNTER — Inpatient Hospital Stay (HOSPITAL_COMMUNITY): Admission: RE | Admit: 2013-08-18 | Payer: Medicare HMO | Source: Ambulatory Visit

## 2013-08-19 ENCOUNTER — Ambulatory Visit: Payer: Medicare HMO

## 2013-08-19 ENCOUNTER — Telehealth: Payer: Self-pay | Admitting: Family Medicine

## 2013-08-19 NOTE — Telephone Encounter (Signed)
Called pt and he demands to have blood work done now due to possible STD exposure from this wife. He wants to be tested for every thing possible. I explained to the pt several times, that in order to have him tested, he needs to be seen by a doctor here. We do not just order labs without an OV. Pt was very upset and demands testing, but declines OV. Will fwd. To J.Marvel Plan and PCP for advise. Thanks. Javier Glazier, Gerrit Heck

## 2013-08-19 NOTE — Telephone Encounter (Signed)
Pt called and would like an emergency blood work done. Can someone call him jw

## 2013-08-19 NOTE — Telephone Encounter (Signed)
Called pt. He demands to have blood work done due to possible STD exposure from his wife. I explained to the pt several times, that in order to

## 2013-08-19 NOTE — Telephone Encounter (Signed)
Spoke with patient and told him he needs OV to have blood work done.  Pt states, "That's ok, Im not worried about it now".  Adam Ray, [name], [name]

## 2013-08-19 NOTE — Telephone Encounter (Signed)
Pt will need office visit in order to have STD testing done. Please inform pt. [name], MD

## 2013-08-20 ENCOUNTER — Encounter: Payer: Self-pay | Admitting: Emergency Medicine

## 2013-08-20 ENCOUNTER — Ambulatory Visit (INDEPENDENT_AMBULATORY_CARE_PROVIDER_SITE_OTHER): Payer: Medicare HMO | Admitting: Family Medicine

## 2013-08-20 VITALS — BP 151/84 | HR 65 | Temp 97.2°F | Ht 70.0 in | Wt 163.0 lb

## 2013-08-20 DIAGNOSIS — Z113 Encounter for screening for infections with a predominantly sexual mode of transmission: Secondary | ICD-10-CM

## 2013-08-20 DIAGNOSIS — A630 Anogenital (venereal) warts: Secondary | ICD-10-CM

## 2013-08-20 DIAGNOSIS — Z20828 Contact with and (suspected) exposure to other viral communicable diseases: Secondary | ICD-10-CM

## 2013-08-20 DIAGNOSIS — Z202 Contact with and (suspected) exposure to infections with a predominantly sexual mode of transmission: Secondary | ICD-10-CM

## 2013-08-20 LAB — HIV ANTIBODY (ROUTINE TESTING W REFLEX): HIV: NONREACTIVE

## 2013-08-20 NOTE — Assessment & Plan Note (Addendum)
Noted on exam as patient came in for STD screening. Discussed treatment options would only be cosmetic and would not reduce transfer. Given size of lesions, suspect these have been there for some time. Extensive discussion with patient in wife > 30 minutes about STDs and HPV. Also screened with HIV, RPR. Urine gc/chlamydia/trichomonas was ordered but patient filled cup (was instructed to keep below fill line).   Ultimately, I agree with Dr. [name] that family may benefit from meeting with social worker given distrust of each other regarding sexual infidelity. I encouraged patient to keep appointment with Dr. [name] to discuss if he wants to pursue treatment (thougha stated is purely cosmetic).

## 2013-08-20 NOTE — Progress Notes (Signed)
  Adam Reddish, MD Phone: [phone]  Subjective:  Chief complaint-noted  Genital Lesion On exam discovered 5 wart like lesions on patients penis though none in surrounding area. Inquired with patient and states been there "all my life". Adam Ray adamantly denies. Very suspicious of marital infidelity (both parties).   Patient and Adam Ray presented for STD screening initially (which has been repeatedly done over last year). Concern is due to Adam Ray having itching after each time they have sex. She had a separate visit today with Dr. Bridgett Larsson.   ROS- no fever, chills, nausea, vomiting, penile discharge, dysuria, polyuria Past Medical History-gonorrhea in his 37s, none since that time, tobacco abuse, diabetes, hypertension Medications- reviewed and updated Current Outpatient Prescriptions  Medication Sig Dispense Refill  . albuterol (PROVENTIL HFA;VENTOLIN HFA) 108 (90 BASE) MCG/ACT inhaler Inhale 2 puffs into the lungs every 6 (six) hours as needed. For shortness of breath  18 g  1  . aspirin EC 81 MG tablet Take 81 mg by mouth daily.      . furosemide (LASIX) 20 MG tablet TAKE 1 TABLET BY MOUTH EVERY DAY MAY TAKE UP TO 2 PER DAY  30 tablet  7  . insulin aspart (NOVOLOG) 100 UNIT/ML injection 25 units with your biggest meal of the day. Do not take if you do not eat that day.  1 vial  0  . insulin glargine (LANTUS) 100 UNIT/ML injection 100 units in the AM with breakfast.  50 mL  0  . metFORMIN (GLUCOPHAGE) 1000 MG tablet Take 1,000 mg by mouth daily with breakfast.      . quinapril (ACCUPRIL) 5 MG tablet Take 0.5 tablets (2.5 mg total) by mouth daily.  30 tablet  11  . sildenafil (VIAGRA) 100 MG tablet TAKE 1 TABLET 1 HOUR BEFORE SEXUAL RELATIONS  5 tablet  1  . [DISCONTINUED] levofloxacin (LEVAQUIN) 750 MG tablet Take 1 tablet (750 mg total) by mouth daily. For 14 days  14 tablet  0   No current facility-administered medications for this visit.    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 Discussed treatment options would only be cosmetic and would not reduce transfer. Given size of lesions, suspect these have been there for some time. Extensive discussion with patient in Adam Ray > 30 minutes about STDs and HPV. Also screened with HIV, RPR. Urine gc/chlamydia/trichomonas was ordered but patient filled cup (was instructed to keep below fill line). He will return to complete this portion of STD screening.   Ultimately, I agree with Dr. [name] that family may benefit from meeting with social worker given distrust of each other regarding sexual infidelity.   >50% of 35 minute office visit was spent on counseling (about HPV, about protection from STDs, etc., about concerns of infidelity)  Orders Placed This Encounter  Procedures  . HIV antibody  . RPR

## 2013-08-20 NOTE — Patient Instructions (Addendum)
For your labs, I will send you a letter if there are no medication changes needed. I will call you if we need to discuss your lab results.  Orders Placed This Encounter  Procedures  . HIV antibody  . RPR  also tested for gonorrhea and chlamydia.   It does appear you have genital warts. To discuss this further as far as treatment options, I want you to schedule to see your primary care doctor, Dr. [name].  You also need to discuss the following with her: Health Maintenance Due  Topic Date Due  . Colonoscopy  02/20/2005  . Urine Microalbumin  09/10/2012  . Lipid Panel  10/26/2012  . Foot Exam  04/27/2013       Genital Warts Genital warts are a sexually transmitted infection. They may appear as small bumps on the tissues of the genital area. CAUSES  Genital warts are caused by a virus called human papillomavirus (HPV). HPV is the most common sexually transmitted disease (STD) and infection of the sex organs. This infection is spread by having unprotected sex with an infected person. It can be spread by vaginal, anal, and oral sex. Many people do not know they are infected. They may be infected for years without problems. However, even if they do not have problems, they can unknowingly pass the infection to their sexual partners. SYMPTOMS   Itching and irritation in the genital area.  Warts that bleed.  Painful sexual intercourse. DIAGNOSIS  Warts are usually recognized with the naked eye on the vagina, vulva, perineum, anus, and rectum. Certain tests can also diagnose genital warts, such as:  A Pap test.  A tissue sample (biopsy) exam.  Colposcopy. A magnifying tool is used to examine the vagina and cervix. The HPV cells will change color when certain solutions are used. TREATMENT  Warts can be removed by:  Applying certain chemicals, such as cantharidin or podophyllin.  Liquid nitrogen freezing (cryotherapy).  Immunotherapy with candida or trichophyton  injections.  Laser treatment.  Burning with an electrified probe (electrocautery).  Interferon injections.  Surgery. PREVENTION  HPV vaccination can help prevent HPV infections that cause genital warts and that cause cancer of the cervix. It is recommended that the vaccination be given to people between the ages 23 to 44 years old. The vaccine might not work as well or might not work at all if you already have HPV. It should not be given to pregnant women. HOME CARE INSTRUCTIONS   It is important to follow your caregiver's instructions. The warts will not go away without treatment. Repeat treatments are often needed to get rid of warts. Even after it appears that the warts are gone, the normal tissue underneath often remains infected.  Do not try to treat genital warts with medicine used to treat hand warts. This type of medicine is strong and can burn the skin in the genital area, causing more damage.  Tell your past and current sexual partner(s) that you have genital warts. They may be infected also and need treatment.  Avoid sexual contact while being treated.  Do not touch or scratch the warts. The infection may spread to other parts of your body.  Women with genital warts should have a cervical cancer check (Pap test) at least once a year. This type of cancer is slow-growing and can be cured if found early. Chances of developing cervical cancer are increased with HPV.  Inform your obstetrician about your warts in the event of pregnancy. This virus can  be passed to the baby's respiratory tract. Discuss this with your caregiver.  Use a condom during sexual intercourse. Following treatment, the use of condoms will help prevent reinfection.  Ask your caregiver about using over-the-counter anti-itch creams. SEEK MEDICAL CARE IF:   Your treated skin becomes red, swollen, or painful.  You have a fever.  You feel generally ill.  You feel little lumps in and around your genital  area.  You are bleeding or have painful sexual intercourse. MAKE SURE YOU:   Understand these instructions.  Will watch your condition.  Will get help right away if you are not doing well or get worse. Document Released: 06/14/2000 Document Revised: 09/09/2011 Document Reviewed: 12/24/2010 [facility] Patient Information 2014 [place], [place].

## 2013-08-21 LAB — RPR

## 2013-08-23 ENCOUNTER — Other Ambulatory Visit: Payer: Commercial Managed Care - HMO

## 2013-08-23 ENCOUNTER — Other Ambulatory Visit (HOSPITAL_COMMUNITY)
Admission: RE | Admit: 2013-08-23 | Discharge: 2013-08-23 | Disposition: A | Discharge status: Home / Self Care | Payer: Medicare HMO | Source: Ambulatory Visit | Attending: Emergency Medicine | Admitting: Emergency Medicine

## 2013-08-23 DIAGNOSIS — Z20828 Contact with and (suspected) exposure to other viral communicable diseases: Secondary | ICD-10-CM

## 2013-08-23 DIAGNOSIS — Z113 Encounter for screening for infections with a predominantly sexual mode of transmission: Secondary | ICD-10-CM | POA: Insufficient documentation

## 2013-08-23 NOTE — Progress Notes (Signed)
Patient here to repeat urine cytology, sample was invalid due to the amount collected on Friday.Busick, Kevin Fenton

## 2013-08-24 ENCOUNTER — Telehealth: Payer: Self-pay | Admitting: *Deleted

## 2013-08-24 ENCOUNTER — Encounter: Payer: Self-pay | Admitting: *Deleted

## 2013-08-24 LAB — URINE CYTOLOGY ANCILLARY ONLY
CHLAMYDIA, DNA PROBE: NEGATIVE
NEISSERIA GONORRHEA: NEGATIVE

## 2013-08-24 NOTE — Telephone Encounter (Signed)
Message copied by Valerie Roys on Tue Aug 24, 2013  2:04 PM ------      Message from: Marin Olp      Created: Tue Aug 24, 2013  1:35 PM       [facility] team- can you let patient know that syphilis and HIV testing were negative? Thanks ------

## 2013-08-24 NOTE — Telephone Encounter (Signed)
Pt called and left message for [place] to contact him regarding labs.  Called pt and back and was unable to reach him.  Please let him know that labs were negative please. Thanks [employer]

## 2013-08-24 NOTE — Telephone Encounter (Signed)
Result letter sent to patient. Deaveon Schoen,CMA

## 2013-08-27 ENCOUNTER — Telehealth: Payer: Self-pay | Admitting: Family Medicine

## 2013-08-27 DIAGNOSIS — IMO0001 Reserved for inherently not codable concepts without codable children: Secondary | ICD-10-CM

## 2013-08-27 DIAGNOSIS — E1165 Type 2 diabetes mellitus with hyperglycemia: Principal | ICD-10-CM

## 2013-08-27 NOTE — Telephone Encounter (Signed)
Patient has Medicare and states he is unable to afford his co-pay/deductible for the Lantus.  Will be able to afford his meds in 60 days.  Patient received Lantus samples 2 weeks ago.  Will check with PCP and Dr. Valentina Lucks (Pharmacist) and see what options are available to patient.  Patient unable to come to office before we close today at 5:00 pm.  Will call patient back on Monday.  Burna Forts, BSN, RN-BC

## 2013-08-27 NOTE — Telephone Encounter (Signed)
Patient is requesting samples on Lantus. Please advise.

## 2013-08-30 ENCOUNTER — Encounter (HOSPITAL_COMMUNITY): Payer: Self-pay | Admitting: Emergency Medicine

## 2013-08-30 ENCOUNTER — Emergency Department (HOSPITAL_COMMUNITY)
Admission: EM | Admit: 2013-08-30 | Discharge: 2013-08-30 | Disposition: A | Discharge status: Home / Self Care | Payer: Medicare HMO | Attending: Emergency Medicine | Admitting: Emergency Medicine

## 2013-08-30 DIAGNOSIS — Z794 Long term (current) use of insulin: Secondary | ICD-10-CM | POA: Insufficient documentation

## 2013-08-30 DIAGNOSIS — Z8679 Personal history of other diseases of the circulatory system: Secondary | ICD-10-CM | POA: Insufficient documentation

## 2013-08-30 DIAGNOSIS — F17200 Nicotine dependence, unspecified, uncomplicated: Secondary | ICD-10-CM | POA: Insufficient documentation

## 2013-08-30 DIAGNOSIS — J4489 Other specified chronic obstructive pulmonary disease: Secondary | ICD-10-CM | POA: Insufficient documentation

## 2013-08-30 DIAGNOSIS — J449 Chronic obstructive pulmonary disease, unspecified: Secondary | ICD-10-CM | POA: Insufficient documentation

## 2013-08-30 DIAGNOSIS — N189 Chronic kidney disease, unspecified: Secondary | ICD-10-CM | POA: Insufficient documentation

## 2013-08-30 DIAGNOSIS — Z7982 Long term (current) use of aspirin: Secondary | ICD-10-CM | POA: Insufficient documentation

## 2013-08-30 DIAGNOSIS — A630 Anogenital (venereal) warts: Secondary | ICD-10-CM | POA: Insufficient documentation

## 2013-08-30 DIAGNOSIS — E119 Type 2 diabetes mellitus without complications: Secondary | ICD-10-CM | POA: Insufficient documentation

## 2013-08-30 DIAGNOSIS — Z79899 Other long term (current) drug therapy: Secondary | ICD-10-CM | POA: Insufficient documentation

## 2013-08-30 NOTE — ED Provider Notes (Signed)
CSN: [id number]     Arrival date & time 08/30/13  1559 History  This chart was scribed for non-physician practitioner, Harvie Heck, PA-C,working with Alfonzo Feller, DO, by Marlowe Kays, ED Scribe.  This patient was seen in room TR07C/TR07C and the patient's care was started at 5:56 PM.  Chief Complaint  Patient presents with  . moles??    The history is provided by the patient. No language interpreter was used.   HPI Comments:  Adam Ray is a 58 y.o. male who presents to the Emergency Department complaining of lesions to his penis for over 30 yeras. He states he has been evaluated in the past for the same symptoms and "doesn't know" what he was told about them. He states there are 2-3 of the lesions on the sides of his penile shaft. He states he is sexually active, currently one male partner, his wife. He denies swelling, itching, discharge, pain or burning, urinary symptoms, or testicular swelling.   Past Medical History  Diagnosis Date  . Diabetes mellitus   . COPD (chronic obstructive pulmonary disease)   . Allergy   . Chronic kidney disease   . Bronchitis   . Gout   . Claudication     right foot ray resection   Past Surgical History  Procedure Laterality Date  . Surgery left great toe    . Tear ducts bilateral eyes    . I&d extremity  04/11/2012    Procedure: IRRIGATION AND DEBRIDEMENT EXTREMITY;  Surgeon: Wylene Simmer, MD;  Location: [place];  Service: Orthopedics;  Laterality: Right;   Family History  Problem Relation Age of Onset  . Diabetes Mother   . Stroke Mother   . Heart failure Father    History  Substance Use Topics  . Smoking status: Current Every Day Smoker -- 0.30 packs/day for 48 years    Types: Cigars    Start date: 07/02/1963  . Smokeless tobacco: Current User     Comment: wants to use electric cigarettes.  not interested in pills, worried about chantix side effects  . Alcohol Use: No    Review of Systems  Constitutional: Negative for  fever.  Genitourinary: Positive for genital sores. Negative for dysuria, frequency, hematuria, decreased urine volume, discharge, penile swelling, scrotal swelling, difficulty urinating, penile pain and testicular pain.  Skin: Negative for color change and wound.  All other systems reviewed and are negative.    Allergies  Review of patient's allergies indicates no known allergies.  Home Medications   Current Outpatient Rx  Name  Route  Sig  Dispense  Refill  . albuterol (PROVENTIL HFA;VENTOLIN HFA) 108 (90 BASE) MCG/ACT inhaler   Inhalation   Inhale 2 puffs into the lungs every 6 (six) hours as needed. For shortness of breath   18 g   1   . aspirin EC 81 MG tablet   Oral   Take 81 mg by mouth daily.         . furosemide (LASIX) 20 MG tablet      TAKE 1 TABLET BY MOUTH EVERY DAY MAY TAKE UP TO 2 PER DAY   30 tablet   7   . insulin aspart (NOVOLOG) 100 UNIT/ML injection      25 units with your biggest meal of the day. Do not take if you do not eat that day.   1 vial   0   . insulin glargine (LANTUS) 100 UNIT/ML injection      100 units  in the AM with breakfast.   50 mL   0   . metFORMIN (GLUCOPHAGE) 1000 MG tablet   Oral   Take 1,000 mg by mouth daily with breakfast.         . quinapril (ACCUPRIL) 5 MG tablet   Oral   Take 0.5 tablets (2.5 mg total) by mouth daily.   30 tablet   11   . sildenafil (VIAGRA) 100 MG tablet      TAKE 1 TABLET 1 HOUR BEFORE SEXUAL RELATIONS   5 tablet   1    Triage Vitals: BP 131/75  Pulse 67  Temp(Src) 97.9 F (36.6 C) (Oral)  Resp 20  Wt 160 lb (72.576 kg)  SpO2 100% Physical Exam  Nursing note and vitals reviewed. Constitutional: He is oriented to person, place, and time. He appears well-developed and well-nourished. No distress.  HENT:  Head: Normocephalic and atraumatic.  Eyes: EOM are normal.  Neck: Neck supple.  Pulmonary/Chest: Effort normal. No respiratory distress.  Genitourinary: Right testis shows no  tenderness. Left testis shows no tenderness.  2 soft fleshy, cauliflower-like lesions to left shaft of penis and 2 on the right side. No vesicles identified. No lymphadenopathy.   Musculoskeletal: Normal range of motion.  Lymphadenopathy:       Right: No inguinal adenopathy present.       Left: No inguinal adenopathy present.  Neurological: He is alert and oriented to person, place, and time.  Skin: Skin is warm and dry. No erythema.  Psychiatric: He has a normal mood and affect. His behavior is normal.    ED Course  Procedures (including critical care time) COORDINATION OF CARE: 6:13 PM- Advised pt to follow up with a dermatologist for further treatment of probable genital warts. Will giver dermatology referral and advised pt to follow up with his PCP. Pt verbalizes understanding and agrees to plan.  Medications - No data to display  Labs Review Labs Reviewed - No data to display Imaging Review No results found.   EKG Interpretation None      MDM   Final diagnoses:  Genital warts  Pt presents for chronic genital lesions. EMR shows Pt had HIV, RPR, GC, and chlamydia testing, all negative from 08/20/13. Has been evaluated for genital warts multiple times in the past.  Pt has several cauliflower like lesions to shaft of penis. I don't feel that repeat STI testing is indicated at this time. Spent over 15 minutes counseling pt and pt's wife about STI and using protection with every sexual encounter.  The pt's source was concerned about infidelity and previous sexual partners.  Advise pt's wife to follow up with her OB/GYN for regular Pap-smears, pelvic exam, and further monitoring. Advised pt to go to dermatologist for further evaluation and treatment of genital warts. Return precautions given. Reports understanding and no other concerns at this time.  Patient is stable for discharge at this time.  I personally performed the services described in this documentation, which was scribed in  my presence. The recorded information has been reviewed and is accurate.    Lorrine Kin, PA-C 09/02/13 [phone]

## 2013-08-30 NOTE — ED Notes (Signed)
Pt. Stated, i just want somebody to tell me what kind of bumps i have on my groin area for the last 4-50 years.

## 2013-08-30 NOTE — Telephone Encounter (Signed)
Informed patient that nurse will contact him back regarding his insulin

## 2013-08-30 NOTE — ED Notes (Signed)
Per pt sts that he has had bumps on his groin area for 35 or 40 years and he would like to know if they are moles are genital warts. Denies pain.

## 2013-08-30 NOTE — Discharge Instructions (Signed)
Call for a follow up appointment with a Family or Primary Care Provider.  Call [facility] for further evaluation and treatment of your genital warts. Use condoms with each sexual encounter. Return if Symptoms worsen.   Take medication as prescribed.

## 2013-08-30 NOTE — Telephone Encounter (Signed)
Returned call to patient and left message to call our office back.  Richardson, Jeannette Ann, RN  

## 2013-08-30 NOTE — Telephone Encounter (Signed)
Pt called again about his insulin  Please call

## 2013-08-30 NOTE — Telephone Encounter (Signed)
No Lantus pens available--only vials.  Will check if patient has syringes at home.  Will also check on options for possible places patient can get insulin cheaper until he can afford his co-pay.  Returned call to patient and left message to call our office back.  Burna Forts, BSN, RN-BC

## 2013-09-01 ENCOUNTER — Telehealth: Payer: Self-pay | Admitting: Family Medicine

## 2013-09-01 ENCOUNTER — Ambulatory Visit (HOSPITAL_COMMUNITY)
Admission: RE | Admit: 2013-09-01 | Discharge: 2013-09-01 | Disposition: A | Discharge status: Home / Self Care | Payer: Medicare HMO | Source: Ambulatory Visit | Attending: Cardiovascular Disease | Admitting: Cardiovascular Disease

## 2013-09-01 DIAGNOSIS — I739 Peripheral vascular disease, unspecified: Secondary | ICD-10-CM

## 2013-09-01 MED ORDER — INSULIN GLARGINE 100 UNIT/ML ~~LOC~~ SOLN: SUBCUTANEOUS | Status: DC

## 2013-09-01 NOTE — Telephone Encounter (Signed)
Adam Ray is requesting a referral to a dermatologist.   He is scheduled with his provider on 3/12, but want to see someone before then.

## 2013-09-01 NOTE — Telephone Encounter (Signed)
Called patient and informed that we do not have Lantus pens--only have Lantus vials.  Patient states he has insulin syringes at home.  Will place Lantus vial #1 in med fridge for patient to pick up.  Informed patient that we need to find other options for him to afford med due to our office not always having insulin samples available.  Patient verbalized understanding and states she should be able to afford his co-pay soon.  Burna Forts, BSN, RN-BC

## 2013-09-01 NOTE — Progress Notes (Signed)
Lower Extremity Arterial Duplex Completed. °Brianna L Mazza,RVT °

## 2013-09-01 NOTE — Telephone Encounter (Signed)
Told pt he needs to discuss with his PCP and that we would discuss on 09/09/2013.  Adam Ray, Adam Ray, Adam Ray

## 2013-09-02 ENCOUNTER — Ambulatory Visit: Payer: Medicare HMO | Admitting: Family Medicine

## 2013-09-03 NOTE — ED Provider Notes (Signed)
Medical screening examination/treatment/procedure(s) were performed by non-physician practitioner and as supervising physician I was immediately available for consultation/collaboration.   EKG Interpretation None        Alfonzo Feller, DO 09/03/13 1436

## 2013-09-05 ENCOUNTER — Encounter: Payer: Self-pay | Admitting: *Deleted

## 2013-09-09 ENCOUNTER — Encounter: Payer: Self-pay | Admitting: Family Medicine

## 2013-09-09 ENCOUNTER — Ambulatory Visit (INDEPENDENT_AMBULATORY_CARE_PROVIDER_SITE_OTHER): Payer: Medicare HMO | Admitting: Family Medicine

## 2013-09-09 VITALS — BP 125/73 | HR 58 | Temp 98.5°F | Ht 71.0 in | Wt 164.3 lb

## 2013-09-09 DIAGNOSIS — D485 Neoplasm of uncertain behavior of skin: Secondary | ICD-10-CM

## 2013-09-09 DIAGNOSIS — A630 Anogenital (venereal) warts: Secondary | ICD-10-CM

## 2013-09-09 DIAGNOSIS — H547 Unspecified visual loss: Secondary | ICD-10-CM

## 2013-09-09 NOTE — Patient Instructions (Addendum)
It was great to see you again today!  I am referring you to dermatology for the spot on your penis. You will get a phone call to schedule this appointment.   Please call your eye doctor to schedule a follow up appointment to discuss the vision problems you've been having. If anything suddenly changes with your vision, go immediately to the ER.   Come back to see me within the next month so we can discuss your diabetes.  Be well, Dr. [name]

## 2013-09-09 NOTE — Progress Notes (Signed)
Patient ID: Adam Ray, male   DOB: 1956/06/01, 58 y.o.   MRN: [id number]  HPI:  Penile lesion: was seen here recently at [facility] for STD testing and penile exam showed lesion suspicious for genital warts. Pt does not think this is genital warts and would like referral to dermatologist. States this lesion has been present as long as he can remember. Denies any drainage from lesion or from penis. No itching, hurting, or burning. Has never had anything like this anywhere else on his body. It has not move or spread. No other rashes on his body.  Decreased vision in R eye: has hx of moderate nonproliferative diabetic retinopathy and has an ophthalmologist that he's followed up with previously. Has noted his vision getting worse in his R eye over the past several months. Has no acute pain, drainage, or acute vision loss in his R eye, this has all been chronic/gradual.   ROS: See HPI  [place]: hx DM poorly controlled  PHYSICAL EXAM: BP 125/73  Pulse 58  Temp(Src) 98.5 F (36.9 C) (Oral)  Ht 5\' 11"  (1.803 m)  Wt 164 lb 4.8 oz (74.526 kg)  BMI 22.93 kg/m2 Visual acuity: R 20/160  L 20/50  Both 20/50 Gen: NAD HEENT: NCAT, PERRL, EOMI, visualized portion of fundus appears normal bilaterally (no cataracts appreciated). No perilimbal redness or conjunctival injection. GU: Penis has hyperpigmented plaque that is approximately 0.5cm in diameter on left lateral portion of mid-shaft. No appreciable penile drainage or tenderness. No inguinal lymphadenopathy. Exam chaperoned by Dr. Lindell Noe.  ASSESSMENT/PLAN:  # Decreased vision in R eye: no signs of acute ophthalmologic emergency. This is a chronic problem. Advised pt call his ophthalmologist to schedule an appointment. Gave return precautions should his eye acutely become painful or have acute vision loss. Precepted with Dr. Lindell Noe who agrees with this plan.   FOLLOW UP: F/u in one month with me for diabetes and other chronic medical problems.  [place].  [name], [place]

## 2013-09-14 ENCOUNTER — Telehealth: Payer: Self-pay | Admitting: Family Medicine

## 2013-09-14 ENCOUNTER — Ambulatory Visit: Payer: Medicare HMO | Admitting: Cardiovascular Disease

## 2013-09-14 NOTE — Assessment & Plan Note (Signed)
Appearance of penile lesion possibly genital wart. Discussed potential treatment options with patient today, although pt would strongly prefer to be referred to dermatology for additional evaluation. Will enter referral. Precepted with Dr. Lindell Noe who also examined patient and agrees with this plan.

## 2013-09-14 NOTE — Telephone Encounter (Signed)
Patient would like to know the status of his Dermatology referral.

## 2013-09-15 NOTE — Telephone Encounter (Signed)
Referral faxed to [facility]. Spoke with pt and explain how dermatology referral work Pt was agree and he will call to set up appt after I faxed referral to [facility].   [place]

## 2013-09-28 ENCOUNTER — Telehealth: Payer: Self-pay | Admitting: Family Medicine

## 2013-09-28 NOTE — Telephone Encounter (Signed)
Adam Ray is requesting that we send a copy of his [employer] card to the dermatologist office.  They are asking that we give that to them for his visit.

## 2013-09-30 ENCOUNTER — Ambulatory Visit (INDEPENDENT_AMBULATORY_CARE_PROVIDER_SITE_OTHER): Payer: Medicare HMO | Admitting: Ophthalmology

## 2013-09-30 DIAGNOSIS — E1139 Type 2 diabetes mellitus with other diabetic ophthalmic complication: Secondary | ICD-10-CM

## 2013-09-30 DIAGNOSIS — E1165 Type 2 diabetes mellitus with hyperglycemia: Secondary | ICD-10-CM

## 2013-09-30 DIAGNOSIS — E11319 Type 2 diabetes mellitus with unspecified diabetic retinopathy without macular edema: Secondary | ICD-10-CM

## 2013-09-30 DIAGNOSIS — I10 Essential (primary) hypertension: Secondary | ICD-10-CM

## 2013-09-30 DIAGNOSIS — E11359 Type 2 diabetes mellitus with proliferative diabetic retinopathy without macular edema: Secondary | ICD-10-CM

## 2013-09-30 DIAGNOSIS — H35039 Hypertensive retinopathy, unspecified eye: Secondary | ICD-10-CM

## 2013-09-30 DIAGNOSIS — H2510 Age-related nuclear cataract, unspecified eye: Secondary | ICD-10-CM

## 2013-10-01 ENCOUNTER — Other Ambulatory Visit: Payer: Self-pay | Admitting: Family Medicine

## 2013-10-04 NOTE — Telephone Encounter (Signed)
Please call Mr. Brittle back regarding referral to dermatologist

## 2013-10-13 ENCOUNTER — Telehealth: Payer: Self-pay | Admitting: Family Medicine

## 2013-10-13 NOTE — Telephone Encounter (Signed)
Please call pt and obtain a little more info for why he needs me to call him, in the event this is something that can be handled without me speaking with him directly.  [name], MD

## 2013-10-13 NOTE — Telephone Encounter (Signed)
FYI: [facility] faxed note, that pt DNKA for 10/12/13 at 3 pm. .[place]

## 2013-10-13 NOTE — Telephone Encounter (Signed)
Pt called and would like Dr. [name] to call him at [phone]. jw

## 2013-10-14 NOTE — Telephone Encounter (Signed)
Called pt at mobile number. Wrong number. Male answered the phone. I deleted the number. Called pt's home phone. Phone not available. Wrong number? Adam Ray

## 2013-10-14 NOTE — Telephone Encounter (Signed)
Called pt.

## 2013-10-28 ENCOUNTER — Telehealth: Payer: Self-pay | Admitting: Family Medicine

## 2013-10-28 NOTE — Telephone Encounter (Signed)
Need refills on all meds as well as needing test strips and lancets

## 2013-11-02 ENCOUNTER — Encounter: Payer: Self-pay | Admitting: Cardiovascular Disease

## 2013-11-02 ENCOUNTER — Ambulatory Visit (INDEPENDENT_AMBULATORY_CARE_PROVIDER_SITE_OTHER): Payer: Medicare HMO | Admitting: Cardiovascular Disease

## 2013-11-02 VITALS — BP 128/80 | HR 85 | Ht 71.0 in | Wt 158.0 lb

## 2013-11-02 DIAGNOSIS — IMO0001 Reserved for inherently not codable concepts without codable children: Secondary | ICD-10-CM

## 2013-11-02 DIAGNOSIS — E1165 Type 2 diabetes mellitus with hyperglycemia: Secondary | ICD-10-CM

## 2013-11-02 DIAGNOSIS — M79609 Pain in unspecified limb: Secondary | ICD-10-CM

## 2013-11-02 DIAGNOSIS — IMO0002 Reserved for concepts with insufficient information to code with codable children: Secondary | ICD-10-CM

## 2013-11-02 DIAGNOSIS — I10 Essential (primary) hypertension: Secondary | ICD-10-CM

## 2013-11-02 DIAGNOSIS — M79604 Pain in right leg: Secondary | ICD-10-CM

## 2013-11-02 MED ORDER — LANCETS MISC. MISC: Status: DC

## 2013-11-02 MED ORDER — BLOOD GLUCOSE TEST VI STRP: ORAL_STRIP | Status: DC

## 2013-11-02 NOTE — Progress Notes (Signed)
Adam Ray returns for followup of his lower extremity R. Jewell Doppler studies performed in our office 09/01/13 which were entirely normal. These were ordered to rule out PAD. He has had a resection of his right foot. I have reassured him that there is no evidence of peripheral arterial disease and I will see him back on an as-needed basis.    Lorretta Harp, M.D., [place] [employer] [place], [facility], [place] [address] [place], [place]  [address]  [phone] 11/02/2013 3:49 PM

## 2013-11-02 NOTE — Assessment & Plan Note (Addendum)
Adam Ray is referred to me by the wound Center. He had Dopplers performed in the office 09/01/13 which were entirely normal. This suggests that he is no evidence of peripheral arterial disease.

## 2013-11-02 NOTE — Telephone Encounter (Signed)
Pt notified, again, to call his pharmacy for refills OR to specifically tell us what medications he needs refilled when he calls.  I have instructed patient on this numerous times.  I have also spoken to patient several times and reminded him he needs OV with his PCP.  I reminded pt again and he said he will call to schedule.  [place]

## 2013-11-02 NOTE — Telephone Encounter (Signed)
I will fill test strips and lancets. Patient needs to supply a list of what medications he specifically needs refilled. He also needs to schedule an appointment with me to follow up on his diabetes.  [name], MD

## 2013-11-02 NOTE — Patient Instructions (Signed)
Follow up with Dr Berry as needed.  

## 2013-11-04 ENCOUNTER — Encounter (INDEPENDENT_AMBULATORY_CARE_PROVIDER_SITE_OTHER): Payer: Medicare HMO | Admitting: Ophthalmology

## 2013-11-09 ENCOUNTER — Encounter: Payer: Self-pay | Admitting: Family Medicine

## 2013-11-09 ENCOUNTER — Ambulatory Visit (INDEPENDENT_AMBULATORY_CARE_PROVIDER_SITE_OTHER): Payer: Medicare HMO | Admitting: Family Medicine

## 2013-11-09 VITALS — BP 128/83 | HR 60 | Ht 71.0 in | Wt 158.0 lb

## 2013-11-09 DIAGNOSIS — A630 Anogenital (venereal) warts: Secondary | ICD-10-CM

## 2013-11-09 NOTE — Progress Notes (Signed)
   Subjective:    Patient ID: Adam Ray, male    DOB: 1956/01/23, 58 y.o.   MRN: [id number]  HPI 58 y/o male presents for follow up of genital warts. He is seen by dermatology in [facility] and initiated treatment with Aldara 2 weeks ago, he had follow up yesterday and reports that they sent in a refill for this Aldara, patient is concerned as there are small wounds developing at the sight of treatment, patient's significant other is concerned for other STD's. Patient denies penile discharge, no dysuria, no penile pain, the ulcerations are painful.    Review of Systems  Constitutional: Negative for fever, chills and fatigue.  Genitourinary: Positive for genital sores. Negative for discharge, penile swelling, scrotal swelling, penile pain and testicular pain.       Objective:   Physical Exam Vitals: reviewed Gen: pleasant AAM GU: bilateral testes descended, no urethral discharge, multiple small ulcerations on scrotum in the areas that have been treated with Aldara, there are also multiple sessile/polypoid lesion on the penis and scrotum that are consistent with genital warts  HIV/RPR negative in 08/2013    Assessment & Plan:  Please see problem specific assessment and plan.

## 2013-11-09 NOTE — Assessment & Plan Note (Signed)
Patient has ulcerations present in areas that he has applied Aldara. He still has multiple warts on the scrotum/penis that have not been treated. -Patient told to discontinue Aldara for one week to allow the ulcerations to heal. -An HSV culture was obtained to rule out herpes as the cause for his lesions.  -He has had negative RPR/HIV in 08/2013.  -Return to office in one week, consider transition to Podofilox.

## 2013-11-09 NOTE — Patient Instructions (Signed)
I think that the wounds on your scrotum are from the treatment for your genital warts. Please stop the Aldara for one week and follow up in our office. A Herpes culture was obtained today and Dr. Ree Kida will call you with the results.

## 2013-11-10 ENCOUNTER — Encounter (HOSPITAL_COMMUNITY): Payer: Self-pay | Admitting: Emergency Medicine

## 2013-11-10 ENCOUNTER — Emergency Department (HOSPITAL_COMMUNITY)
Admission: EM | Admit: 2013-11-10 | Discharge: 2013-11-10 | Disposition: A | Discharge status: Home / Self Care | Payer: Medicare HMO | Attending: Emergency Medicine | Admitting: Emergency Medicine

## 2013-11-10 DIAGNOSIS — N189 Chronic kidney disease, unspecified: Secondary | ICD-10-CM | POA: Insufficient documentation

## 2013-11-10 DIAGNOSIS — Z8709 Personal history of other diseases of the respiratory system: Secondary | ICD-10-CM | POA: Insufficient documentation

## 2013-11-10 DIAGNOSIS — Z794 Long term (current) use of insulin: Secondary | ICD-10-CM | POA: Insufficient documentation

## 2013-11-10 DIAGNOSIS — Z8679 Personal history of other diseases of the circulatory system: Secondary | ICD-10-CM | POA: Insufficient documentation

## 2013-11-10 DIAGNOSIS — Z7982 Long term (current) use of aspirin: Secondary | ICD-10-CM | POA: Insufficient documentation

## 2013-11-10 DIAGNOSIS — F17200 Nicotine dependence, unspecified, uncomplicated: Secondary | ICD-10-CM | POA: Insufficient documentation

## 2013-11-10 DIAGNOSIS — J4489 Other specified chronic obstructive pulmonary disease: Secondary | ICD-10-CM | POA: Insufficient documentation

## 2013-11-10 DIAGNOSIS — Z862 Personal history of diseases of the blood and blood-forming organs and certain disorders involving the immune mechanism: Secondary | ICD-10-CM | POA: Insufficient documentation

## 2013-11-10 DIAGNOSIS — Z8639 Personal history of other endocrine, nutritional and metabolic disease: Secondary | ICD-10-CM | POA: Insufficient documentation

## 2013-11-10 DIAGNOSIS — J449 Chronic obstructive pulmonary disease, unspecified: Secondary | ICD-10-CM | POA: Insufficient documentation

## 2013-11-10 DIAGNOSIS — N509 Disorder of male genital organs, unspecified: Secondary | ICD-10-CM | POA: Insufficient documentation

## 2013-11-10 DIAGNOSIS — E119 Type 2 diabetes mellitus without complications: Secondary | ICD-10-CM | POA: Insufficient documentation

## 2013-11-10 MED ORDER — VALACYCLOVIR HCL 1 G PO TABS: 1000.0000 mg | ORAL_TABLET | Freq: Two times a day (BID) | ORAL | Status: DC

## 2013-11-10 NOTE — ED Notes (Signed)
Patient presents to ED via POV. Patient c/o of "genital warts" that have been active x2 months. Patient states that they are getting worse and some are "draining." Normal bowel and bladder function.  No acute distress noted at this time. A&Ox4.

## 2013-11-10 NOTE — ED Provider Notes (Signed)
CSN: [id number]     Arrival date & time 11/10/13  0126 History   First MD Initiated Contact with Patient 11/10/13 0135     Chief Complaint  Patient presents with  . Genital Warts     (Consider location/radiation/quality/duration/timing/severity/associated sxs/prior Treatment) HPI Patient reports intercourse outside of his.  She and reports new scrotal lesions over the past 2 months.  He states he was seen by infectious disease physicians in [facility] and was told he had chin no warts and has been applying cream without improvement in his symptoms.  He's concerned this could be something else.  Said no fevers or chills.  He denies penile pain.  He denies penile discharge.  No abdominal pain.  No other complaints.   Past Medical History  Diagnosis Date  . Diabetes mellitus   . COPD (chronic obstructive pulmonary disease)   . Allergy   . Chronic kidney disease   . Bronchitis   . Gout   . Claudication     right foot ray resection   Past Surgical History  Procedure Laterality Date  . Surgery left great toe    . Tear ducts bilateral eyes    . I&d extremity  04/11/2012    Procedure: IRRIGATION AND DEBRIDEMENT EXTREMITY;  Surgeon: Wylene Simmer, MD;  Location: [place];  Service: Orthopedics;  Laterality: Right;   Family History  Problem Relation Age of Onset  . Diabetes Mother   . Stroke Mother   . Heart failure Father    History  Substance Use Topics  . Smoking status: Current Every Day Smoker -- 0.30 packs/day for 48 years    Types: Cigars    Start date: 07/02/1963  . Smokeless tobacco: Current User     Comment: wants to use electric cigarettes.  not interested in pills, worried about chantix side effects  . Alcohol Use: No    Review of Systems  All other systems reviewed and are negative.     Allergies  Review of patient's allergies indicates no known allergies.  Home Medications   Prior to Admission medications   Medication Sig Start Date End Date Taking?  Authorizing Provider  aspirin EC 81 MG tablet Take 81 mg by mouth daily.   Yes Historical Provider, MD  furosemide (LASIX) 20 MG tablet Take 20 mg by mouth daily.   Yes Historical Provider, MD  imiquimod (ALDARA) 5 % cream Apply topically 3 (three) times a week. Monday,Wednesday, Friday 10/22/13  Yes Historical Provider, MD  insulin aspart (NOVOLOG) 100 UNIT/ML injection Inject 25 Units into the skin 3 (three) times daily before meals.   Yes Historical Provider, MD  insulin glargine (LANTUS) 100 UNIT/ML injection Inject 100 Units into the skin at bedtime.   Yes Historical Provider, MD  metFORMIN (GLUCOPHAGE) 1000 MG tablet Take 1,000 mg by mouth daily with breakfast. 12/23/12  Yes Carolin Guernsey, MD  quinapril (ACCUPRIL) 5 MG tablet Take 0.5 tablets (2.5 mg total) by mouth daily. 11/24/12  Yes Carolin Guernsey, MD  sildenafil (VIAGRA) 100 MG tablet Take 100 mg by mouth daily as needed for erectile dysfunction.   Yes Historical Provider, MD  B-D INS SYRINGE 0.5CC/31GX5/16 31G X 5/16" 0.5 ML MISC USE AS DIRECTED    [name], MD  Glucose Blood (BLOOD GLUCOSE TEST STRIPS) STRP Check blood sugar 4x daily 11/02/13   [name], MD  Lancets Misc. MISC Check blood sugar 4x daily 11/02/13   [name], MD  BP 134/80  Pulse 75  Temp(Src) 97.7 F (36.5 C) (Oral)  Resp 18  Ht 5\' 10"  (1.778 m)  Wt 170 lb (77.111 kg)  BMI 24.39 kg/m2  SpO2 98% Physical Exam  Nursing note and vitals reviewed. Constitutional: He is oriented to person, place, and time. He appears well-developed and well-nourished.  HENT:  Head: Normocephalic.  Eyes: EOM are normal.  Neck: Normal range of motion.  Pulmonary/Chest: Effort normal.  Abdominal: He exhibits no distension.  Genitourinary:  Circumcised penis.  No lesions over his glans over the shaft of his penis.  Patient with multiple eroded scrotal lesions primarily on the anterior inferior aspect of his scrotum itself.  There is no surrounding  erythema.  There is no drainage or discharge.  There is no foul smell.  He has no crepitus to his perineal space.  No fluid filled pustules are noted  Musculoskeletal: Normal range of motion.  Neurological: He is alert and oriented to person, place, and time.  Psychiatric: He has a normal mood and affect.    ED Course  Procedures (including critical care time) Labs Review Labs Reviewed - No data to display  Imaging Review No results found.   EKG Interpretation None      MDM   Final diagnoses:  None    This could represent herpes.  Patient be referred to infectious disease.  Oral start the patient on twice a day valacyclovir for 10 day course.    Hoy Morn, MD 11/10/13 [phone]

## 2013-11-10 NOTE — ED Notes (Signed)
Patient requesting to speak to EDP. EDP notified of request and stated, "I will be there to talk to patient shortly." Patient updated.

## 2013-11-11 LAB — HERPES SIMPLEX VIRUS CULTURE: Organism ID, Bacteria: NOT DETECTED

## 2013-11-12 ENCOUNTER — Encounter: Payer: Self-pay | Admitting: Family Medicine

## 2013-11-16 ENCOUNTER — Ambulatory Visit: Payer: Medicare HMO | Admitting: Family Medicine

## 2013-11-19 ENCOUNTER — Ambulatory Visit (INDEPENDENT_AMBULATORY_CARE_PROVIDER_SITE_OTHER): Payer: Medicare HMO | Admitting: Family Medicine

## 2013-11-19 ENCOUNTER — Encounter: Payer: Self-pay | Admitting: Family Medicine

## 2013-11-19 VITALS — BP 115/69 | HR 73 | Temp 98.9°F | Ht 70.0 in | Wt 161.0 lb

## 2013-11-19 DIAGNOSIS — IMO0001 Reserved for inherently not codable concepts without codable children: Secondary | ICD-10-CM

## 2013-11-19 DIAGNOSIS — I10 Essential (primary) hypertension: Secondary | ICD-10-CM

## 2013-11-19 DIAGNOSIS — E1165 Type 2 diabetes mellitus with hyperglycemia: Secondary | ICD-10-CM

## 2013-11-19 DIAGNOSIS — R42 Dizziness and giddiness: Secondary | ICD-10-CM

## 2013-11-19 DIAGNOSIS — A630 Anogenital (venereal) warts: Secondary | ICD-10-CM

## 2013-11-19 DIAGNOSIS — IMO0002 Reserved for concepts with insufficient information to code with codable children: Secondary | ICD-10-CM

## 2013-11-19 LAB — BASIC METABOLIC PANEL
BUN: 9 mg/dL (ref 6–23)
CO2: 30 mEq/L (ref 19–32)
CREATININE: 1.07 mg/dL (ref 0.50–1.35)
Calcium: 8.6 mg/dL (ref 8.4–10.5)
Chloride: 102 mEq/L (ref 96–112)
Glucose, Bld: 133 mg/dL — ABNORMAL HIGH (ref 70–99)
POTASSIUM: 4.2 meq/L (ref 3.5–5.3)
Sodium: 139 mEq/L (ref 135–145)

## 2013-11-19 LAB — CBC
HCT: 40 % (ref 39.0–52.0)
Hemoglobin: 14.1 g/dL (ref 13.0–17.0)
MCH: 28.9 pg (ref 26.0–34.0)
MCHC: 35.3 g/dL (ref 30.0–36.0)
MCV: 82 fL (ref 78.0–100.0)
Platelets: 117 10*3/uL — ABNORMAL LOW (ref 150–400)
RBC: 4.88 MIL/uL (ref 4.22–5.81)
RDW: 13.9 % (ref 11.5–15.5)
WBC: 4.2 10*3/uL (ref 4.0–10.5)

## 2013-11-19 LAB — TSH: TSH: 0.297 u[IU]/mL — AB (ref 0.350–4.500)

## 2013-11-19 LAB — GLUCOSE, CAPILLARY: GLUCOSE-CAPILLARY: 133 mg/dL — AB (ref 70–99)

## 2013-11-19 NOTE — Patient Instructions (Addendum)
Genital Warts - ok to restart Aldara, discontinue after one week to prevent ulcerations, stop for one week then restart if you still have warts  Lightheadedness - check lab work today, stop Lasix for 3 days to see if symptoms improved, also check blood glucoses 4 times a day for the next 3 days to look for lows  Return in 1-2 weeks (with Dr. [name]) to follow up on lightheadedness.

## 2013-11-20 NOTE — Assessment & Plan Note (Signed)
Scrotal Ulcerations improved. Ok to restart Aldara. Patient counseled to only use for a week then take a week off to prevent skin irritations/ulceration.

## 2013-11-20 NOTE — Progress Notes (Signed)
   Subjective:    Patient ID: Adam Ray, male    DOB: 11/07/1955, 58 y.o.   MRN: [id number]  HPI 58 y/o male presents for follow up of genital warts. Patient has significant irritation/ulceration from Aldara at last visit, he stopped Aldara and has improvement of his symptoms, he has restarted Aldara a few days ago  He also reports intermittent lightheadedness over the past few days, no vertigo, no change with movement of his head/standing/or ambulation, he thinks that it may be related to his blood sugars, fasting blood glucoses in the 90's to low 100's, does not check other times of the day, has not check his sugars when he has had the lightheadedness, takes Lantus 100 units at night and Novolog 25 units at breakfast (prescribed TID before meals), no associated chest pain/sob/leg swelling/orthopnea/PND, no recent travel or surgery, no hearing or vision changes, he is also on Quinipril 5 mg daily and Lasix 20 mg daily for DM/HTN/Leg swelling   Review of Systems  Constitutional: Negative for fever, chills and fatigue.  HENT: Negative for congestion, hearing loss, sore throat and tinnitus.   Respiratory: Negative for cough and shortness of breath.   Cardiovascular: Negative for chest pain, palpitations and leg swelling.  Gastrointestinal: Negative for nausea, vomiting, abdominal pain and diarrhea.  Genitourinary: Positive for genital sores. Negative for dysuria, discharge, penile swelling, penile pain and testicular pain.       Objective:   Physical Exam  Constitutional: Vital signs are normal. He appears well-developed and well-nourished. No distress.  HENT:  Head: Normocephalic.  Right Ear: Tympanic membrane, external ear and ear canal normal.  Left Ear: Tympanic membrane, external ear and ear canal normal.  Mouth/Throat: Uvula is midline, oropharynx is clear and moist and mucous membranes are normal.  Eyes: Conjunctivae and EOM are normal. Pupils are equal, round, and reactive to  light.  Neck: Neck supple.  Cardiovascular: Normal rate, regular rhythm, S1 normal and S2 normal.   No murmur heard. Pulses:      Radial pulses are 2+ on the right side, and 2+ on the left side.       Dorsalis pedis pulses are 2+ on the right side, and 2+ on the left side.  Pulmonary/Chest: Effort normal and breath sounds normal.  Ext: 1+ edema in bilateral LE to the knee Skin: chronic skin changes on anterior shin from edema GU: ulcerations on scrotum significantly improved from last visit, genital warts still present on penis and scrotum  POC glucose 133 Orthostatics wnl       Assessment & Plan:  Please see problem specific assessment and plan.

## 2013-11-20 NOTE — Assessment & Plan Note (Signed)
Well controlled. Orthostatic blood pressure negative. Will attempt trial off Lasix for three days to see if this improves his lightheadedness.

## 2013-11-20 NOTE — Assessment & Plan Note (Signed)
Three day history of lightheadedness. Suspect hypoglycemia however POC glucose 133. Patient to check blood glucoses when he is symptomatic. Orthostatics wnl however will also attempt 3 day trial off Lasix to see if symptoms improve. Also check lab work including TSH/CBC/BMP. -Return for follow up with PCP next week.

## 2013-11-20 NOTE — Assessment & Plan Note (Signed)
Hypoglycemia may be contributing to lightheadedness however POC glucose 133. Patient counseled to continue current regimen of Lantus 100 units nightly and Novolog 25 units before breakfast (was supposed to be taking TID before meals). Check blood glucoses when he becomes lightheaded.

## 2013-11-23 ENCOUNTER — Telehealth: Payer: Self-pay | Admitting: Family Medicine

## 2013-11-23 ENCOUNTER — Ambulatory Visit: Payer: Medicare HMO | Admitting: Cardiovascular Disease

## 2013-11-24 ENCOUNTER — Encounter: Payer: Self-pay | Admitting: Family Medicine

## 2013-11-24 NOTE — Telephone Encounter (Signed)
Unable to reach patient by telephone, will send letter.  

## 2013-11-25 ENCOUNTER — Encounter (HOSPITAL_COMMUNITY): Payer: Self-pay | Admitting: Emergency Medicine

## 2013-11-25 ENCOUNTER — Emergency Department (HOSPITAL_COMMUNITY)
Admission: EM | Admit: 2013-11-25 | Discharge: 2013-11-25 | Discharge status: Against Medical Advice | Payer: Medicare HMO | Attending: Emergency Medicine | Admitting: Emergency Medicine

## 2013-11-25 DIAGNOSIS — F329 Major depressive disorder, single episode, unspecified: Secondary | ICD-10-CM | POA: Insufficient documentation

## 2013-11-25 DIAGNOSIS — F911 Conduct disorder, childhood-onset type: Secondary | ICD-10-CM | POA: Insufficient documentation

## 2013-11-25 DIAGNOSIS — F17200 Nicotine dependence, unspecified, uncomplicated: Secondary | ICD-10-CM | POA: Insufficient documentation

## 2013-11-25 DIAGNOSIS — Z8679 Personal history of other diseases of the circulatory system: Secondary | ICD-10-CM | POA: Insufficient documentation

## 2013-11-25 DIAGNOSIS — A630 Anogenital (venereal) warts: Secondary | ICD-10-CM | POA: Insufficient documentation

## 2013-11-25 DIAGNOSIS — F39 Unspecified mood [affective] disorder: Secondary | ICD-10-CM | POA: Insufficient documentation

## 2013-11-25 DIAGNOSIS — N189 Chronic kidney disease, unspecified: Secondary | ICD-10-CM | POA: Insufficient documentation

## 2013-11-25 DIAGNOSIS — J449 Chronic obstructive pulmonary disease, unspecified: Secondary | ICD-10-CM | POA: Insufficient documentation

## 2013-11-25 DIAGNOSIS — Z8709 Personal history of other diseases of the respiratory system: Secondary | ICD-10-CM | POA: Insufficient documentation

## 2013-11-25 DIAGNOSIS — Z7982 Long term (current) use of aspirin: Secondary | ICD-10-CM | POA: Insufficient documentation

## 2013-11-25 DIAGNOSIS — F3289 Other specified depressive episodes: Secondary | ICD-10-CM | POA: Insufficient documentation

## 2013-11-25 DIAGNOSIS — J4489 Other specified chronic obstructive pulmonary disease: Secondary | ICD-10-CM | POA: Insufficient documentation

## 2013-11-25 DIAGNOSIS — Z794 Long term (current) use of insulin: Secondary | ICD-10-CM | POA: Insufficient documentation

## 2013-11-25 DIAGNOSIS — Z79899 Other long term (current) drug therapy: Secondary | ICD-10-CM | POA: Insufficient documentation

## 2013-11-25 DIAGNOSIS — F32A Depression, unspecified: Secondary | ICD-10-CM

## 2013-11-25 DIAGNOSIS — E119 Type 2 diabetes mellitus without complications: Secondary | ICD-10-CM | POA: Insufficient documentation

## 2013-11-25 LAB — COMPREHENSIVE METABOLIC PANEL
ALBUMIN: 3.6 g/dL (ref 3.5–5.2)
Alkaline Phosphatase: 126 U/L — ABNORMAL HIGH (ref 39–117)
BUN: 14 mg/dL (ref 6–23)
CHLORIDE: 103 meq/L (ref 96–112)
CO2: 26 mEq/L (ref 19–32)
Calcium: 9.3 mg/dL (ref 8.4–10.5)
Creatinine, Ser: 1.12 mg/dL (ref 0.50–1.35)
GFR calc Af Amer: 82 mL/min — ABNORMAL LOW (ref >90)
GFR calc non Af Amer: 71 mL/min — ABNORMAL LOW (ref >90)
Glucose, Bld: 70 mg/dL (ref 70–99)
POTASSIUM: 4.5 meq/L (ref 3.7–5.3)
SODIUM: 142 meq/L (ref 137–147)
TOTAL PROTEIN: 6.4 g/dL (ref 6.0–8.3)
Total Bilirubin: 0.3 mg/dL (ref 0.3–1.2)

## 2013-11-25 LAB — CBC
HCT: 41.1 % (ref 39.0–52.0)
Hemoglobin: 14.1 g/dL (ref 13.0–17.0)
MCH: 29.4 pg (ref 26.0–34.0)
MCHC: 34.3 g/dL (ref 30.0–36.0)
MCV: 85.6 fL (ref 78.0–100.0)
PLATELETS: 113 10*3/uL — AB (ref 150–400)
RBC: 4.8 MIL/uL (ref 4.22–5.81)
RDW: 13.4 % (ref 11.5–15.5)
WBC: 4.3 10*3/uL (ref 4.0–10.5)

## 2013-11-25 NOTE — ED Provider Notes (Signed)
CSN: [id number]     Arrival date & time 11/25/13  1500 History   First MD Initiated Contact with Patient 11/25/13 1738     Chief Complaint  Patient presents with  . Medical Clearance     (Consider location/radiation/quality/duration/timing/severity/associated sxs/prior Treatment) The history is provided by the patient.   patient states that he was sent here from [place] [facility] to see a counselor. The paperwork with the patient states go to [facility]. Patient states she's had anger and depression since he was cheating on his wife 2 weeks ago. He states he got warts at that time also. He's been seen for that already. He denies a history of depression. He denies suicidal or homicidal thoughts. He denies substance abuse. He states he does smoke cigars  Past Medical History  Diagnosis Date  . Diabetes mellitus   . COPD (chronic obstructive pulmonary disease)   . Allergy   . Chronic kidney disease   . Bronchitis   . Gout   . Claudication     right foot ray resection   Past Surgical History  Procedure Laterality Date  . Surgery left great toe    . Tear ducts bilateral eyes    . I&d extremity  04/11/2012    Procedure: IRRIGATION AND DEBRIDEMENT EXTREMITY;  Surgeon: Wylene Simmer, MD;  Location: [place];  Service: Orthopedics;  Laterality: Right;   Family History  Problem Relation Age of Onset  . Diabetes Mother   . Stroke Mother   . Heart failure Father    History  Substance Use Topics  . Smoking status: Current Every Day Smoker -- 0.30 packs/day for 48 years    Types: Cigars    Start date: 07/02/1963  . Smokeless tobacco: Current User     Comment: wants to use electric cigarettes.  not interested in pills, worried about chantix side effects  . Alcohol Use: No    Review of Systems  HENT: Negative for congestion.   Respiratory: Positive for cough.   Genitourinary:       Patient states he has some genital warts  Skin: Negative for wound.   Psychiatric/Behavioral: Positive for dysphoric mood. Negative for suicidal ideas and behavioral problems.      Allergies  Review of patient's allergies indicates no known allergies.  Home Medications   Prior to Admission medications   Medication Sig Start Date End Date Taking? Authorizing Provider  aspirin EC 81 MG tablet Take 81 mg by mouth daily.    Historical Provider, MD  B-D INS SYRINGE 0.5CC/31GX5/16 31G X 5/16" 0.5 ML MISC USE AS DIRECTED    [name], MD  furosemide (LASIX) 20 MG tablet Take 20 mg by mouth daily.    Historical Provider, MD  Glucose Blood (BLOOD GLUCOSE TEST STRIPS) STRP Check blood sugar 4x daily 11/02/13   [name], MD  imiquimod (ALDARA) 5 % cream Apply topically 3 (three) times a week. Monday,Wednesday, Friday 10/22/13   Historical Provider, MD  insulin aspart (NOVOLOG) 100 UNIT/ML injection Inject 25 Units into the skin 3 (three) times daily before meals.    Historical Provider, MD  insulin glargine (LANTUS) 100 UNIT/ML injection Inject 100 Units into the skin at bedtime.    Historical Provider, MD  Lancets Misc. MISC Check blood sugar 4x daily 11/02/13   [name], MD  metFORMIN (GLUCOPHAGE) 1000 MG tablet Take 1,000 mg by mouth daily with breakfast. 12/23/12   Carolin Guernsey, MD  quinapril (ACCUPRIL) 5  MG tablet Take 0.5 tablets (2.5 mg total) by mouth daily. 11/24/12   Carolin Guernsey, MD  sildenafil (VIAGRA) 100 MG tablet Take 100 mg by mouth daily as needed for erectile dysfunction.    Historical Provider, MD   BP 114/69  Pulse 74  Temp(Src) 98.1 F (36.7 C) (Oral)  Resp 16  SpO2 98% Physical Exam  Constitutional: He is oriented to person, place, and time. He appears well-developed and well-nourished.  Cardiovascular: Normal rate and regular rhythm.   Pulmonary/Chest: Effort normal.  Abdominal: Soft.  Musculoskeletal: He exhibits no edema.  Neurological: He is alert and oriented to person, place, and time.  Skin: Skin  is warm.  Psychiatric:  Patient is somewhat poor store and. He does not appear overly depressed    ED Course  Procedures (including critical care time) Labs Review Labs Reviewed  CBC - Abnormal; Notable for the following:    Platelets 113 (*)    All other components within normal limits  COMPREHENSIVE METABOLIC PANEL - Abnormal; Notable for the following:    Alkaline Phosphatase 126 (*)    GFR calc non Af Amer 71 (*)    GFR calc Af Amer 82 (*)    All other components within normal limits  URINE RAPID DRUG SCREEN (HOSP PERFORMED)    Imaging Review No results found.   EKG Interpretation None      MDM   Final diagnoses:  None    Patient presented for medical clearance. Wanted to see a Social worker. He was not willing to wait and left the ER.    Jasper Riling. Alvino Chapel, MD 11/26/13 5102

## 2013-11-25 NOTE — ED Notes (Signed)
Per pt, states he is here to be treated for depression-states he is sad and angry r/t health problems

## 2013-12-06 ENCOUNTER — Encounter: Payer: Self-pay | Admitting: Family Medicine

## 2013-12-06 ENCOUNTER — Ambulatory Visit (INDEPENDENT_AMBULATORY_CARE_PROVIDER_SITE_OTHER): Payer: Commercial Managed Care - HMO | Admitting: Family Medicine

## 2013-12-06 VITALS — BP 113/73 | HR 66 | Ht 70.0 in | Wt 156.0 lb

## 2013-12-06 DIAGNOSIS — F32A Depression, unspecified: Secondary | ICD-10-CM

## 2013-12-06 DIAGNOSIS — A630 Anogenital (venereal) warts: Secondary | ICD-10-CM

## 2013-12-06 DIAGNOSIS — F3289 Other specified depressive episodes: Secondary | ICD-10-CM

## 2013-12-06 DIAGNOSIS — F329 Major depressive disorder, single episode, unspecified: Secondary | ICD-10-CM

## 2013-12-06 MED ORDER — PODOFILOX 0.5 % EX SOLN: Freq: Two times a day (BID) | CUTANEOUS | Status: DC

## 2013-12-06 MED ORDER — SERTRALINE HCL 50 MG PO TABS: 50.0000 mg | ORAL_TABLET | Freq: Every day | ORAL | Status: DC

## 2013-12-06 NOTE — Patient Instructions (Signed)
It was great to see you again today!  For warts: stop the Aldara. I sent in a new medicine, podofilox. Starting on Friday, apply it twice a day for 3 days, then stop for four days. Then restart for 3 days, and stop for four days. You can repeat this cycle (3 days on, 3 days off) up to four times.  For depression: I sent in zoloft for you. You need to follow up in 1 week to ensure you are tolerating the medicine okay. If you have any thoughts of hurting yourself or anyone else, go to the Emergency Room to stay safe.   Be well, Dr. [name]

## 2013-12-09 DIAGNOSIS — F329 Major depressive disorder, single episode, unspecified: Secondary | ICD-10-CM | POA: Insufficient documentation

## 2013-12-09 DIAGNOSIS — F32A Depression, unspecified: Secondary | ICD-10-CM | POA: Insufficient documentation

## 2013-12-09 NOTE — Assessment & Plan Note (Addendum)
Ulcerative areas of scrotum secondary to aldara use. Will stop this therapy and switch to podofilox, which will hopefully be less irritating. Instructed pt to wait several days before beginning therapy with podofilox. He is still planning to follow up with dermatology next week. Precepted with Dr. Ree Kida who also examined patient and agrees with this plan.

## 2013-12-09 NOTE — Assessment & Plan Note (Addendum)
New diagnosis today. Pt would like to start medication. He endorses recent thoughts of self harm but denies having any thoughts or plans to harm himself at this time. He was able to contract for safety and stated that if he has thoughts of self harm he will go to the ER to stay safe. No hx concerning for bipolar spectrum illness. Will start low dose zoloft 50mg  daily and have pt return in 1 week to assess tolerability of medication and reassess suicidality. Will provide only a limited quantity (10 pills) to ensure that he follows up in the near future. Precepted with Dr. Ree Kida who agrees with this plan.

## 2013-12-09 NOTE — Progress Notes (Signed)
Patient ID: Adam Ray, male   DOB: 1956-03-13, 58 y.o.   MRN: [id number]  HPI:  Genital warts: pt presents for f/u of genital warts. Was seen by dermatology and given Aldara cream, which he has applied to his genital warts. He has also been seen here at the [facility] by Dr. Ree Kida for painful genital ulcers as a result of the Aldara cream. He is planning to follow up with dermatology next week. He last used the Aldara on Saturday. He has continued pain in his scrotal area. It has been painful off and on for one month. This recent painful episode has gone on for about two weeks. He and his wife are very displeased because they believe a pill exists to treat genital warts and that his doctors have been withholding this pill from him. They also believe he needs treatment with hormones.  Depression: at end of visit pt brings up depression. States that the current problem he has with his genitals are causing him to be depressed and anxious. He does admit to having had thoughts of harming himself. Has not tried to harm himself, but says he previously had a plan of running his car off the road. He denies having thoughts or plans of harming himself at this time. He would like to start medication for depression and anxiety today. He denies any history concerning for prior manic episodes. Has never been hospitalized for mental health before.  ROS: See HPI. Denies fevers, vomiting, penile discharge, new ulcers or warts elsewhere on his body  [place]: DM2, HTN, COPD  PHYSICAL EXAM: BP 113/73  Pulse 66  Ht 5\' 10"  (1.778 m)  Wt 156 lb (70.761 kg)  BMI 22.38 kg/m2 Gen: NAD HEENT: NCAT Lungs: NWOB GU: genital warts present on shaft of penis. Ulcerative areas present on much of scrotum. No drainage or signs of superinfection. No penile discharge.  Neuro: grossly nonfocal, speech normal Psych: somewhat unkempt in appearance. Normal eye contact. Affect is irritable and anxious. Speech normal in rate and  volume.  ASSESSMENT/PLAN:  See problem based charting for additional assessment/plan.  FOLLOW UP: F/u in 1 week for depression to ensure tolerating zoloft  [name], [place]

## 2013-12-14 ENCOUNTER — Encounter: Payer: Self-pay | Admitting: Family Medicine

## 2013-12-14 ENCOUNTER — Ambulatory Visit (INDEPENDENT_AMBULATORY_CARE_PROVIDER_SITE_OTHER): Payer: Commercial Managed Care - HMO | Admitting: Family Medicine

## 2013-12-14 VITALS — BP 119/72 | HR 71 | Ht 70.0 in | Wt 164.0 lb

## 2013-12-14 DIAGNOSIS — F329 Major depressive disorder, single episode, unspecified: Secondary | ICD-10-CM

## 2013-12-14 DIAGNOSIS — IMO0002 Reserved for concepts with insufficient information to code with codable children: Secondary | ICD-10-CM

## 2013-12-14 DIAGNOSIS — N508 Other specified disorders of male genital organs: Secondary | ICD-10-CM

## 2013-12-14 DIAGNOSIS — F32A Depression, unspecified: Secondary | ICD-10-CM

## 2013-12-14 DIAGNOSIS — F3289 Other specified depressive episodes: Secondary | ICD-10-CM

## 2013-12-14 DIAGNOSIS — A630 Anogenital (venereal) warts: Secondary | ICD-10-CM

## 2013-12-14 DIAGNOSIS — Z113 Encounter for screening for infections with a predominantly sexual mode of transmission: Secondary | ICD-10-CM

## 2013-12-14 NOTE — Patient Instructions (Signed)
Genital Warts - continue to use Podofilox twice daily (use for 3 days and then take 4 days off, repeat if you still have genital warts)  Depression - continue daily Zoloft, follow up with Dr. [name] in 3-4 weeks  Check lab work for Herpes

## 2013-12-15 ENCOUNTER — Emergency Department (HOSPITAL_COMMUNITY)
Admission: EM | Admit: 2013-12-15 | Discharge: 2013-12-15 | Disposition: A | Discharge status: Home / Self Care | Payer: Medicare HMO | Attending: Emergency Medicine | Admitting: Emergency Medicine

## 2013-12-15 ENCOUNTER — Encounter (HOSPITAL_COMMUNITY): Payer: Self-pay | Admitting: Emergency Medicine

## 2013-12-15 ENCOUNTER — Encounter: Payer: Self-pay | Admitting: Family Medicine

## 2013-12-15 DIAGNOSIS — Z794 Long term (current) use of insulin: Secondary | ICD-10-CM | POA: Insufficient documentation

## 2013-12-15 DIAGNOSIS — J449 Chronic obstructive pulmonary disease, unspecified: Secondary | ICD-10-CM | POA: Insufficient documentation

## 2013-12-15 DIAGNOSIS — Z862 Personal history of diseases of the blood and blood-forming organs and certain disorders involving the immune mechanism: Secondary | ICD-10-CM | POA: Insufficient documentation

## 2013-12-15 DIAGNOSIS — N508 Other specified disorders of male genital organs: Secondary | ICD-10-CM | POA: Insufficient documentation

## 2013-12-15 DIAGNOSIS — N5089 Other specified disorders of the male genital organs: Secondary | ICD-10-CM

## 2013-12-15 DIAGNOSIS — R300 Dysuria: Secondary | ICD-10-CM | POA: Insufficient documentation

## 2013-12-15 DIAGNOSIS — Z113 Encounter for screening for infections with a predominantly sexual mode of transmission: Secondary | ICD-10-CM | POA: Insufficient documentation

## 2013-12-15 DIAGNOSIS — E119 Type 2 diabetes mellitus without complications: Secondary | ICD-10-CM | POA: Insufficient documentation

## 2013-12-15 DIAGNOSIS — F17200 Nicotine dependence, unspecified, uncomplicated: Secondary | ICD-10-CM | POA: Insufficient documentation

## 2013-12-15 DIAGNOSIS — J4489 Other specified chronic obstructive pulmonary disease: Secondary | ICD-10-CM | POA: Insufficient documentation

## 2013-12-15 DIAGNOSIS — A630 Anogenital (venereal) warts: Secondary | ICD-10-CM

## 2013-12-15 DIAGNOSIS — Z7982 Long term (current) use of aspirin: Secondary | ICD-10-CM | POA: Insufficient documentation

## 2013-12-15 DIAGNOSIS — Z79899 Other long term (current) drug therapy: Secondary | ICD-10-CM | POA: Insufficient documentation

## 2013-12-15 DIAGNOSIS — N189 Chronic kidney disease, unspecified: Secondary | ICD-10-CM | POA: Insufficient documentation

## 2013-12-15 DIAGNOSIS — Z8639 Personal history of other endocrine, nutritional and metabolic disease: Secondary | ICD-10-CM | POA: Insufficient documentation

## 2013-12-15 LAB — CBC WITH DIFFERENTIAL/PLATELET
Basophils Absolute: 0 10*3/uL (ref 0.0–0.1)
Basophils Relative: 1 % (ref 0–1)
Eosinophils Absolute: 0.1 10*3/uL (ref 0.0–0.7)
Eosinophils Relative: 2 % (ref 0–5)
HCT: 39.6 % (ref 39.0–52.0)
Hemoglobin: 13.7 g/dL (ref 13.0–17.0)
LYMPHS ABS: 1.6 10*3/uL (ref 0.7–4.0)
LYMPHS PCT: 27 % (ref 12–46)
MCH: 30.2 pg (ref 26.0–34.0)
MCHC: 34.6 g/dL (ref 30.0–36.0)
MCV: 87.2 fL (ref 78.0–100.0)
Monocytes Absolute: 0.5 10*3/uL (ref 0.1–1.0)
Monocytes Relative: 8 % (ref 3–12)
NEUTROS ABS: 3.9 10*3/uL (ref 1.7–7.7)
NEUTROS PCT: 64 % (ref 43–77)
PLATELETS: 116 10*3/uL — AB (ref 150–400)
RBC: 4.54 MIL/uL (ref 4.22–5.81)
RDW: 14.2 % (ref 11.5–15.5)
WBC: 6.1 10*3/uL (ref 4.0–10.5)

## 2013-12-15 LAB — BASIC METABOLIC PANEL
BUN: 13 mg/dL (ref 6–23)
CHLORIDE: 101 meq/L (ref 96–112)
CO2: 22 meq/L (ref 19–32)
Calcium: 9.5 mg/dL (ref 8.4–10.5)
Creatinine, Ser: 1.01 mg/dL (ref 0.50–1.35)
GFR calc Af Amer: >90 mL/min (ref >90)
GFR calc non Af Amer: 80 mL/min — ABNORMAL LOW (ref >90)
GLUCOSE: 156 mg/dL — AB (ref 70–99)
Potassium: 4.5 mEq/L (ref 3.7–5.3)
SODIUM: 138 meq/L (ref 137–147)

## 2013-12-15 LAB — URINALYSIS, ROUTINE W REFLEX MICROSCOPIC
Bilirubin Urine: NEGATIVE
Glucose, UA: NEGATIVE mg/dL
HGB URINE DIPSTICK: NEGATIVE
Ketones, ur: NEGATIVE mg/dL
Nitrite: NEGATIVE
Protein, ur: NEGATIVE mg/dL
SPECIFIC GRAVITY, URINE: 1.022 (ref 1.005–1.030)
UROBILINOGEN UA: 4 mg/dL — AB (ref 0.0–1.0)
pH: 7 (ref 5.0–8.0)

## 2013-12-15 LAB — URINE MICROSCOPIC-ADD ON

## 2013-12-15 LAB — HSV 2 ANTIBODY, IGG: HSV 2 Glycoprotein G Ab, IgG: <0.1 IV

## 2013-12-15 MED ORDER — SERTRALINE HCL 50 MG PO TABS: 50.0000 mg | ORAL_TABLET | Freq: Every day | ORAL | Status: DC

## 2013-12-15 MED ORDER — OXYCODONE-ACETAMINOPHEN 5-325 MG PO TABS
2.0000 | ORAL_TABLET | Freq: Once | ORAL | Status: AC
  Administered 2013-12-15: 2 via ORAL
  Filled 2013-12-15: qty 2

## 2013-12-15 MED ORDER — DOXYCYCLINE HYCLATE 100 MG PO CAPS: 100.0000 mg | ORAL_CAPSULE | Freq: Two times a day (BID) | ORAL | Status: DC

## 2013-12-15 NOTE — Progress Notes (Signed)
   Subjective:    Patient ID: Adam Ray, male    DOB: [birth year], 58 y.o.   MRN: [id number]  HPI 58 y/o male presents for follow up of genital warts and depression.  Genital Warts - recent switched from aldara to podofilox, having less irritation, he has only used twice (not following directions to use twice daily for 3 days and than take 4 days off).  Depression - mood is improved, recently started of Zoloft, taking daily, no side effects, no SI (however admits to previously wanted to hurt self, no specific plan), no HI, sleeping well, eating well, still reports decreased concentration, he states that his decreased mood is related to "drama", he did not wish to go into much detail however wife is present and states that he has been cheating on her   Review of Systems  Constitutional: Negative for fever, chills and fatigue.  Respiratory: Negative for shortness of breath.   Cardiovascular: Negative for chest pain.  Genitourinary: Positive for scrotal swelling and genital sores.       Objective:   Physical Exam Vitals: reviewed Gen: AAM, NAD, accompanied by wife GU: scrotal ulcerations are healing well, still has multiple genital warts on shaft of penis and scrotum Psych: appropriately dressed, affect is outgoing, no tangential thoughts, no SI, no HI     Assessment & Plan:  Please see problem specific assessment and plan.

## 2013-12-15 NOTE — Assessment & Plan Note (Signed)
Mood is improved today. Patient tolerating Zoloft well. -Continue Zoloft. Refill provided.  -Follow up with PCP in one month

## 2013-12-15 NOTE — ED Notes (Signed)
Pt. 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 Denies fever or chills.

## 2013-12-15 NOTE — ED Provider Notes (Signed)
CSN: [id number]     Arrival date & time 12/15/13  2016 History  This chart was scribed for non-physician practitioner Montine Circle, PA-C working with Richarda Blade, MD by Eston Mould, ED Scribe. This patient was seen in room TR08C/TR08C and the patient's care was started at 9:00 PM .   Chief Complaint  Patient presents with  . Genital Warts   The history is provided by the patient. No language interpreter was used.  HPI Comments: Adam Ray is a 58 y.o. male who presents to the Emergency Department complaining of chronic penile warts for 2 months. Pt states he has tried Podophyllin ; reports having swelling and pain to penis. Describes the pain as burning and states he began having dysuria today. Denies penile discharge, fevers, and chills.  Past Medical History  Diagnosis Date  . Diabetes mellitus   . COPD (chronic obstructive pulmonary disease)   . Allergy   . Chronic kidney disease   . Bronchitis   . Gout   . Claudication     right foot ray resection   Past Surgical History  Procedure Laterality Date  . Surgery left great toe    . Tear ducts bilateral eyes    . I&d extremity  04/11/2012    Procedure: IRRIGATION AND DEBRIDEMENT EXTREMITY;  Surgeon: Wylene Simmer, MD;  Location: [place];  Service: Orthopedics;  Laterality: Right;   Family History  Problem Relation Age of Onset  . Diabetes Mother   . Stroke Mother   . Heart failure Father    History  Substance Use Topics  . Smoking status: Current Every Day Smoker -- 0.30 packs/day for 48 years    Types: Cigars    Start date: 07/02/1963  . Smokeless tobacco: Current User     Comment: wants to use electric cigarettes.  not interested in pills, worried about chantix side effects  . Alcohol Use: No    Review of Systems  Constitutional: Negative for fever and chills.  Respiratory: Negative for shortness of breath.   Cardiovascular: Negative for chest pain.  Gastrointestinal: Negative for nausea, vomiting,  diarrhea and constipation.  Genitourinary: Positive for dysuria, penile swelling and penile pain. Negative for discharge.   Allergies  Review of patient's allergies indicates no known allergies.  Home Medications   Prior to Admission medications   Medication Sig Start Date End Date Taking? Authorizing Provider  aspirin EC 81 MG tablet Take 81 mg by mouth daily.    Historical Provider, MD  B-D INS SYRINGE 0.5CC/31GX5/16 31G X 5/16" 0.5 ML MISC USE AS DIRECTED    [name], MD  furosemide (LASIX) 20 MG tablet Take 20 mg by mouth daily.    Historical Provider, MD  Glucose Blood (BLOOD GLUCOSE TEST STRIPS) STRP Check blood sugar 4x daily 11/02/13   [name], MD  insulin aspart (NOVOLOG) 100 UNIT/ML injection Inject 25 Units into the skin 3 (three) times daily before meals.    Historical Provider, MD  insulin glargine (LANTUS) 100 UNIT/ML injection Inject 100 Units into the skin at bedtime.    Historical Provider, MD  Lancets Misc. MISC Check blood sugar 4x daily 11/02/13   [name], MD  metFORMIN (GLUCOPHAGE) 1000 MG tablet Take 1,000 mg by mouth daily with breakfast. 12/23/12   Carolin Guernsey, MD  podofilox (CONDYLOX) 0.5 % external solution Apply topically 2 (two) times daily. Apply twice daily x 3 days, then stop for 4 days. Cycle this regimen up to  four times. 12/06/13   [name], MD  quinapril (ACCUPRIL) 5 MG tablet Take 0.5 tablets (2.5 mg total) by mouth daily. 11/24/12   Carolin Guernsey, MD  sertraline (ZOLOFT) 50 MG tablet Take 1 tablet (50 mg total) by mouth daily. 12/15/13   Lupita Dawn, MD  sildenafil (VIAGRA) 100 MG tablet Take 100 mg by mouth daily as needed for erectile dysfunction.    Historical Provider, MD   BP 114/72  Pulse 79  Temp(Src) 98.8 F (37.1 C) (Oral)  Resp 16  SpO2 97%  Physical Exam  Nursing note and vitals reviewed. Constitutional: He is oriented to person, place, and time. He appears well-developed and well-nourished.  No distress.  HENT:  Head: Normocephalic and atraumatic.  Eyes: EOM are normal.  Neck: Neck supple. No tracheal deviation present.  Cardiovascular: Normal rate.   Pulmonary/Chest: Effort normal. No respiratory distress.  Genitourinary:  Moderately swollen erythematous scrotum with tenderness to palatpion of testicles. Also remarkable for genital warts, no penile discharge.  Chaperon present.  Musculoskeletal: Normal range of motion.  Neurological: He is alert and oriented to person, place, and time.  Skin: Skin is warm and dry.  Psychiatric: He has a normal mood and affect. His behavior is normal.   ED Course  Procedures (including critical care time) DIAGNOSTIC STUDIES: Oxygen Saturation is 97% on RA, normal by my interpretation.    COORDINATION OF CARE: 9:04 PM-Discussed treatment plan which includes consult with attending and UA. Pt agreed to plan.   10:39 PM-Dr. Eulis Foster evaluated patient.  Labs Review Labs Reviewed - No data to display  Imaging Review No results found.   EKG Interpretation None     MDM   Final diagnoses:  Genital warts  Irritation of scrotum    Patient with general warts, and irritation in the scrotum. Suspect this to be medication reaction. Patient seen by and discussed with Dr. Eulis Foster, who recommends treatment with doxycycline, and urology followup. Patient's labs are reassuring. No evidence of fournier's gangrene.  Will discharge to home with urology follow-up.  I personally performed the services described in this documentation, which was scribed in my presence. The recorded information has been reviewed and is accurate.    Montine Circle, PA-C 12/15/13 2246

## 2013-12-15 NOTE — ED Notes (Signed)
Wentz, MD at bedside.  

## 2013-12-15 NOTE — Assessment & Plan Note (Signed)
Patient has had less irritation since stopping Aldara. Has not been using Podofilox as prescribed. Counseled on the correct use of this medication.

## 2013-12-15 NOTE — Discharge Instructions (Signed)
Genital Warts °Genital warts are a sexually transmitted infection. They may appear as small bumps on the tissues of the genital area. °CAUSES  °Genital warts are caused by a virus called human papillomavirus (HPV). HPV is the most common sexually transmitted disease (STD) and infection of the sex organs. This infection is spread by having unprotected sex with an infected person. It can be spread by vaginal, anal, and oral sex. Many people do not know they are infected. They may be infected for years without problems. However, even if they do not have problems, they can unknowingly pass the infection to their sexual partners. °SYMPTOMS  °· Itching and irritation in the genital area. °· Warts that bleed. °· Painful sexual intercourse. °DIAGNOSIS  °Warts are usually recognized with the naked eye on the vagina, vulva, perineum, anus, and rectum. Certain tests can also diagnose genital warts, such as: °· A Pap test. °· A tissue sample (biopsy) exam. °· Colposcopy. A magnifying tool is used to examine the vagina and cervix. The HPV cells will change color when certain solutions are used. °TREATMENT  °Warts can be removed by: °· Applying certain chemicals, such as cantharidin or podophyllin. °· Liquid nitrogen freezing (cryotherapy). °· Immunotherapy with candida or trichophyton injections. °· Laser treatment. °· Burning with an electrified probe (electrocautery). °· Interferon injections. °· Surgery. °PREVENTION  °HPV vaccination can help prevent HPV infections that cause genital warts and that cause cancer of the cervix. It is recommended that the vaccination be given to people between the ages 9 to 26 years old. The vaccine might not work as well or might not work at all if you already have HPV. It should not be given to pregnant women. °HOME CARE INSTRUCTIONS  °· It is important to follow your caregiver's instructions. The warts will not go away without treatment. Repeat treatments are often needed to get rid of warts.  Even after it appears that the warts are gone, the normal tissue underneath often remains infected. °· Do not try to treat genital warts with medicine used to treat hand warts. This type of medicine is strong and can burn the skin in the genital area, causing more damage. °· Tell your past and current sexual partner(s) that you have genital warts. They may be infected also and need treatment. °· Avoid sexual contact while being treated. °· Do not touch or scratch the warts. The infection may spread to other parts of your body. °· Women with genital warts should have a cervical cancer check (Pap test) at least once a year. This type of cancer is slow-growing and can be cured if found early. Chances of developing cervical cancer are increased with HPV. °· Inform your obstetrician about your warts in the event of pregnancy. This virus can be passed to the baby's respiratory tract. Discuss this with your caregiver. °· Use a condom during sexual intercourse. Following treatment, the use of condoms will help prevent reinfection. °· Ask your caregiver about using over-the-counter anti-itch creams. °SEEK MEDICAL CARE IF:  °· Your treated skin becomes red, swollen, or painful. °· You have a fever. °· You feel generally ill. °· You feel little lumps in and around your genital area. °· You are bleeding or have painful sexual intercourse. °MAKE SURE YOU:  °· Understand these instructions. °· Will watch your condition. °· Will get help right away if you are not doing well or get worse. °Document Released: 06/14/2000 Document Revised: 09/09/2011 Document Reviewed: 12/24/2010 °[employer]® Patient Information ©2015 [employer]. This   information is not intended to replace advice given to you by your health care provider. Make sure you discuss any questions you have with your health care provider. ° °

## 2013-12-15 NOTE — Assessment & Plan Note (Signed)
Patient request Herpes blood test. Previously has had negative herpes viral swab. -HSV 2 IGG serum test ordered

## 2013-12-16 ENCOUNTER — Telehealth: Payer: Self-pay | Admitting: Family Medicine

## 2013-12-16 DIAGNOSIS — A630 Anogenital (venereal) warts: Secondary | ICD-10-CM

## 2013-12-16 NOTE — ED Provider Notes (Signed)
  Face-to-face evaluation   History: Onset scrotal rash after initiating use the podophyllin for genital warts  Physical exam: Mild scrotal erythema without scrotal swelling or abnormality of the testicles and scrotal contents. Superficial ulcers of both sides of the scrotum, and the left proximal medial thigh. These ulcers are nonspecific in appearance. There is no significant  inguinal adenopathy. There is no urethral discharge.  Medical screening examination/treatment/procedure(s) were conducted as a shared visit with non-physician practitioner(s) and myself.  I personally evaluated the patient during the encounter  Richarda Blade, MD 12/16/13 [id number]

## 2013-12-16 NOTE — Telephone Encounter (Signed)
Requesting RX  for Ibuprofen.

## 2013-12-20 MED ORDER — LIDOCAINE HCL 2 % EX GEL: 1.0000 "application " | CUTANEOUS | Status: DC | PRN

## 2013-12-20 NOTE — Telephone Encounter (Signed)
Asked by triage RN to address patient's pain from genital warts; patient seen in our office last week, I am precepting and the physicians who saw him not available.  Patient walked into our office today and given appointment to be seen tomorrow morning.  He is asking for prescription for pain medicine.  For this reason I will send a prescription for lidocaine gel, topical, to apply up to 4 times daily as needed.  JB

## 2013-12-20 NOTE — Telephone Encounter (Signed)
Patient here in office requesting something "stronger" for pain. States he was seen at emergency room due to genital warts.  Was given abx and sent to urologist.  Per patient--urologist told patient to stop the meds except for his abx.  Patient c/o pain--rates at 4.  No appts available this afternoon and wife declined work-in appt due to possible long wait.  Scheduled appt for tomorrow with Dr. Ree Kida at 9:15 am.  Consulted with Dr. Lindell Noe (preceptor) and will Rx lidocaine gel.  Patient informed and will pick Rx up at pharmacy and come in for appt tomorrow.  Burna Forts, BSN, RN-BC

## 2013-12-21 ENCOUNTER — Ambulatory Visit (INDEPENDENT_AMBULATORY_CARE_PROVIDER_SITE_OTHER): Payer: Commercial Managed Care - HMO | Admitting: Family Medicine

## 2013-12-21 ENCOUNTER — Encounter (HOSPITAL_COMMUNITY): Payer: Self-pay | Admitting: Emergency Medicine

## 2013-12-21 ENCOUNTER — Encounter: Payer: Self-pay | Admitting: Family Medicine

## 2013-12-21 ENCOUNTER — Emergency Department (HOSPITAL_COMMUNITY)
Admission: EM | Admit: 2013-12-21 | Discharge: 2013-12-21 | Disposition: A | Discharge status: Home / Self Care | Payer: Medicare HMO | Attending: Emergency Medicine | Admitting: Emergency Medicine

## 2013-12-21 VITALS — BP 139/86 | HR 73 | Temp 98.1°F | Wt 162.0 lb

## 2013-12-21 DIAGNOSIS — N492 Inflammatory disorders of scrotum: Secondary | ICD-10-CM

## 2013-12-21 DIAGNOSIS — Z792 Long term (current) use of antibiotics: Secondary | ICD-10-CM | POA: Insufficient documentation

## 2013-12-21 DIAGNOSIS — A630 Anogenital (venereal) warts: Secondary | ICD-10-CM

## 2013-12-21 DIAGNOSIS — J4489 Other specified chronic obstructive pulmonary disease: Secondary | ICD-10-CM | POA: Insufficient documentation

## 2013-12-21 DIAGNOSIS — Z7982 Long term (current) use of aspirin: Secondary | ICD-10-CM | POA: Insufficient documentation

## 2013-12-21 DIAGNOSIS — N498 Inflammatory disorders of other specified male genital organs: Secondary | ICD-10-CM

## 2013-12-21 DIAGNOSIS — R21 Rash and other nonspecific skin eruption: Secondary | ICD-10-CM

## 2013-12-21 DIAGNOSIS — Z794 Long term (current) use of insulin: Secondary | ICD-10-CM | POA: Insufficient documentation

## 2013-12-21 DIAGNOSIS — J449 Chronic obstructive pulmonary disease, unspecified: Secondary | ICD-10-CM | POA: Insufficient documentation

## 2013-12-21 DIAGNOSIS — E119 Type 2 diabetes mellitus without complications: Secondary | ICD-10-CM | POA: Insufficient documentation

## 2013-12-21 DIAGNOSIS — N189 Chronic kidney disease, unspecified: Secondary | ICD-10-CM | POA: Insufficient documentation

## 2013-12-21 DIAGNOSIS — Z79899 Other long term (current) drug therapy: Secondary | ICD-10-CM | POA: Insufficient documentation

## 2013-12-21 DIAGNOSIS — F17200 Nicotine dependence, unspecified, uncomplicated: Secondary | ICD-10-CM | POA: Insufficient documentation

## 2013-12-21 DIAGNOSIS — Z8679 Personal history of other diseases of the circulatory system: Secondary | ICD-10-CM | POA: Insufficient documentation

## 2013-12-21 DIAGNOSIS — N5089 Other specified disorders of the male genital organs: Secondary | ICD-10-CM

## 2013-12-21 DIAGNOSIS — IMO0002 Reserved for concepts with insufficient information to code with codable children: Secondary | ICD-10-CM | POA: Insufficient documentation

## 2013-12-21 MED ORDER — HYDROCORTISONE 0.5 % EX CREA: 1.0000 "application " | TOPICAL_CREAM | Freq: Two times a day (BID) | CUTANEOUS | Status: DC

## 2013-12-21 MED ORDER — OXYCODONE-ACETAMINOPHEN 5-325 MG PO TABS
1.0000 | ORAL_TABLET | Freq: Once | ORAL | Status: AC
  Administered 2013-12-21: 1 via ORAL
  Filled 2013-12-21: qty 1

## 2013-12-21 MED ORDER — TRAMADOL HCL 50 MG PO TABS: 50.0000 mg | ORAL_TABLET | Freq: Three times a day (TID) | ORAL | Status: DC | PRN

## 2013-12-21 MED ORDER — MUPIROCIN 2 % EX OINT: 1.0000 "application " | TOPICAL_OINTMENT | Freq: Two times a day (BID) | CUTANEOUS | Status: DC

## 2013-12-21 NOTE — Discharge Instructions (Signed)
Keep area clean and dry  Wear good supporting underwear  Continue taking doxycycline  Apply lidocaine gel as needed - take ultram as needed for pain Call urology as soon as possible  Return to the emergency department if you develop any changing/worsening condition, fever, abdominal pain, feeling ill, abdominal pain, testicular pain, penile discharge, or any other concerns (please read additional information regarding your condition below)  Genital Warts Genital warts are a sexually transmitted infection. They may appear as small bumps on the tissues of the genital area. CAUSES  Genital warts are caused by a virus called human papillomavirus (HPV). HPV is the most common sexually transmitted disease (STD) and infection of the sex organs. This infection is spread by having unprotected sex with an infected person. It can be spread by vaginal, anal, and oral sex. Many people do not know they are infected. They may be infected for years without problems. However, even if they do not have problems, they can unknowingly pass the infection to their sexual partners. SYMPTOMS   Itching and irritation in the genital area.  Warts that bleed.  Painful sexual intercourse. DIAGNOSIS  Warts are usually recognized with the naked eye on the vagina, vulva, perineum, anus, and rectum. Certain tests can also diagnose genital warts, such as:  A Pap test.  A tissue sample (biopsy) exam.  Colposcopy. A magnifying tool is used to examine the vagina and cervix. The HPV cells will change color when certain solutions are used. TREATMENT  Warts can be removed by:  Applying certain chemicals, such as cantharidin or podophyllin.  Liquid nitrogen freezing (cryotherapy).  Immunotherapy with candida or trichophyton injections.  Laser treatment.  Burning with an electrified probe (electrocautery).  Interferon injections.  Surgery. PREVENTION  HPV vaccination can help prevent HPV infections that cause  genital warts and that cause cancer of the cervix. It is recommended that the vaccination be given to people between the ages 82 to 89 years old. The vaccine might not work as well or might not work at all if you already have HPV. It should not be given to pregnant women. HOME CARE INSTRUCTIONS   It is important to follow your caregiver's instructions. The warts will not go away without treatment. Repeat treatments are often needed to get rid of warts. Even after it appears that the warts are gone, the normal tissue underneath often remains infected.  Do not try to treat genital warts with medicine used to treat hand warts. This type of medicine is strong and can burn the skin in the genital area, causing more damage.  Tell your past and current sexual partner(s) that you have genital warts. They may be infected also and need treatment.  Avoid sexual contact while being treated.  Do not touch or scratch the warts. The infection may spread to other parts of your body.  Women with genital warts should have a cervical cancer check (Pap test) at least once a year. This type of cancer is slow-growing and can be cured if found early. Chances of developing cervical cancer are increased with HPV.  Inform your obstetrician about your warts in the event of pregnancy. This virus can be passed to the baby's respiratory tract. Discuss this with your caregiver.  Use a condom during sexual intercourse. Following treatment, the use of condoms will help prevent reinfection.  Ask your caregiver about using over-the-counter anti-itch creams. SEEK MEDICAL CARE IF:   Your treated skin becomes red, swollen, or painful.  You have a fever.  You feel generally ill.  You feel little lumps in and around your genital area.  You are bleeding or have painful sexual intercourse. MAKE SURE YOU:   Understand these instructions.  Will watch your condition.  Will get help right away if you are not doing well or get  worse. Document Released: 06/14/2000 Document Revised: 09/09/2011 Document Reviewed: 12/24/2010 [facility] Patient Information 2015 [place], [place]. This information is not intended to replace advice given to you by your health care provider. Make sure you discuss any questions you have with your health care provider.

## 2013-12-21 NOTE — ED Provider Notes (Signed)
CSN: [id number]     Arrival date & time 12/21/13  1009 History   First MD Initiated Contact with Patient 12/21/13 1027     Chief Complaint  Patient presents with  . Penis Pain   HPI  Adam Ray is a 58 y.o. male with a PMH of DM, COPD, CKD, gout, and claudication who presents to the ED for evaluation of penis pain. History was provided by the patient and his wife. Patient has hx of penile warts for several months. He was last seen in the ED for this on 12/15/13 and prescribed doxycycline which he has been taking. Patient states that his condition has not worsened. He states the pain, swelling and redness has improved, but has not resolved. He also called his doctor and was called in a prescription for lidocaine topical jel with no relief in his symptoms. He also has a prescription for ultram which he has not filled. He complains of continued scrotal pain and irritation. He occasionally has serosangenous drainage. No purulent drainage. No testicular pain. He denies any dysuria, hematuria, fever, chills, emesis, abdominal pain. He has been tested for HIV, RPR, and herpes which was negative. No new sexual partners. He has tried aldara but was switched to podofilox on 12/06/13, which temporarily improved his condition. Patient has not yet seen a urologist. Patient and wife upset and are worried about cancer.       Past Medical History  Diagnosis Date  . Diabetes mellitus   . COPD (chronic obstructive pulmonary disease)   . Allergy   . Chronic kidney disease   . Bronchitis   . Gout   . Claudication     right foot ray resection   Past Surgical History  Procedure Laterality Date  . Surgery left great toe    . Tear ducts bilateral eyes    . I&d extremity  04/11/2012    Procedure: IRRIGATION AND DEBRIDEMENT EXTREMITY;  Surgeon: Wylene Simmer, MD;  Location: [place];  Service: Orthopedics;  Laterality: Right;   Family History  Problem Relation Age of Onset  . Diabetes Mother   . Stroke Mother   .  Heart failure Father    History  Substance Use Topics  . Smoking status: Current Every Day Smoker -- 0.30 packs/day for 48 years    Types: Cigars    Start date: 07/02/1963  . Smokeless tobacco: Current User     Comment: wants to use electric cigarettes.  not interested in pills, worried about chantix side effects  . Alcohol Use: No    Review of Systems  Constitutional: Negative for fever, chills, activity change, appetite change and fatigue.  Gastrointestinal: Negative for nausea, vomiting and abdominal pain.  Genitourinary: Positive for scrotal swelling, genital sores and penile pain. Negative for dysuria, urgency, frequency, hematuria, flank pain, decreased urine volume, discharge, penile swelling, difficulty urinating and testicular pain.  Musculoskeletal: Negative for back pain and myalgias.  Skin: Positive for wound.  Neurological: Negative for weakness.    Allergies  Review of patient's allergies indicates no known allergies.  Home Medications   Prior to Admission medications   Medication Sig Start Date End Date Taking? Authorizing Provider  aspirin EC 81 MG tablet Take 81 mg by mouth daily.   Yes Historical Provider, MD  doxycycline (VIBRAMYCIN) 100 MG capsule Take 1 capsule (100 mg total) by mouth 2 (two) times daily. 12/15/13  Yes Montine Circle, PA-C  furosemide (LASIX) 20 MG tablet Take 20 mg by mouth daily.  Yes Historical Provider, MD  ibuprofen (ADVIL,MOTRIN) 200 MG tablet Take 1,000 mg by mouth every 6 (six) hours as needed for moderate pain.   Yes Historical Provider, MD  insulin aspart (NOVOLOG) 100 UNIT/ML injection Inject 25 Units into the skin daily.    Yes Historical Provider, MD  insulin glargine (LANTUS) 100 UNIT/ML injection Inject 100 Units into the skin at bedtime.   Yes Historical Provider, MD  lidocaine (XYLOCAINE) 2 % jelly Apply 1 application topically as needed. 12/20/13  Yes Willeen Niece, MD  metFORMIN (GLUCOPHAGE) 1000 MG tablet Take 1,000 mg by  mouth daily at 6 PM.  12/23/12  Yes Carolin Guernsey, MD  quinapril (ACCUPRIL) 5 MG tablet Take 0.5 tablets (2.5 mg total) by mouth daily. 11/24/12  Yes Carolin Guernsey, MD  sildenafil (VIAGRA) 100 MG tablet Take 100 mg by mouth daily as needed for erectile dysfunction.   Yes Historical Provider, MD  hydrocortisone cream 0.5 % Apply 1 application topically 2 (two) times daily. 12/21/13   Lupita Dawn, MD  mupirocin ointment (BACTROBAN) 2 % Place 1 application into the nose 2 (two) times daily. 12/21/13   Lupita Dawn, MD  traMADol (ULTRAM) 50 MG tablet Take 1 tablet (50 mg total) by mouth every 8 (eight) hours as needed. 12/21/13   Lupita Dawn, MD   BP 114/67  Pulse 64  Temp(Src) 98.3 F (36.8 C) (Oral)  Resp 18  SpO2 97%  Filed Vitals:   12/21/13 1015 12/21/13 1209  BP: 114/67 118/76  Pulse: 64 67  Temp: 98.3 F (36.8 C)   TempSrc: Oral   Resp: 18 18  SpO2: 97% 100%    Physical Exam  Nursing note and vitals reviewed. Constitutional: He is oriented to person, place, and time. He appears well-developed and well-nourished. No distress.  Non-toxic  HENT:  Head: Normocephalic and atraumatic.  Right Ear: External ear normal.  Left Ear: External ear normal.  Mouth/Throat: Oropharynx is clear and moist.  Eyes: Conjunctivae are normal. Right eye exhibits no discharge. Left eye exhibits no discharge.  Neck: Neck supple.  Cardiovascular: Normal rate.   Pulmonary/Chest: Effort normal.  Abdominal: Soft. He exhibits no distension. There is no tenderness.  Genitourinary:  Circumcised penis. Genital warts seen on the penis and scrotum bilaterally. No testicular tenderness bilaterally. No scrotal edema bilaterally. No penile tenderness, edema or drainage. Patchy rash with circular erythema and irritation to the scrotum bilaterally and throughout. No purulent drainage. No open wounds. Area not warm to the touch.   Musculoskeletal: Normal range of motion. He exhibits no edema and no  tenderness.  Neurological: He is alert and oriented to person, place, and time.  Skin: Skin is warm and dry. He is not diaphoretic.    ED Course  Procedures (including critical care time) Labs Review Labs Reviewed - No data to display  Imaging Review No results found.   EKG Interpretation None      MDM   BERT PTACEK is a 58 y.o. male with a PMH of DM, COPD, CKD, gout, and clauditation who presents to the ED for evaluation of penis pain. Previous labs and notes reviewed. Etiology of symptoms likely due to chronic genital warts. Also, patient recently seen in the ED for this with scrotal irritation thought to be due to recent treatment for warts. Patient currently on doxycycline for this which has improved his condition. No tenderness on exam. Erythema likely more irritation. Less likely cellulitis or other infectious  process including Fournier's gangrene. Patient afebrile and non-toxic in appearance. Patient encouraged to continue doxycycline and follow-up with urology and PCP. Encouraged to keep area clean and dry. Return precautions, discharge instructions, and follow-up was discussed with the patient before discharge.     Discharge Medication List as of 12/21/2013 12:07 PM      Final impressions: 1. Genital warts   2. Scrotal irritation       Harold Hedge Palmer PA-C          [place], [place] 12/23/13 1133

## 2013-12-21 NOTE — Patient Instructions (Addendum)
Warts/skin infection - continue to not take Aldara or Podofolix. Complete course of Doxycycline (oral antibiotic), apply Bactroban cream (topical antibiotic) twice daily, may take Tramadol 50 mg twice daly as needed for pain  Call and make a follow up with Urology  Rash on arm - suspect allergic reaction, start Hydrocortisone cream twice daily  Return to office in 3-5 days for follow up.

## 2013-12-21 NOTE — ED Notes (Signed)
Pt states he has penile warts.  Has had them for 3 months.  Pt has been treated prior to today with no relief.

## 2013-12-22 DIAGNOSIS — R21 Rash and other nonspecific skin eruption: Secondary | ICD-10-CM | POA: Insufficient documentation

## 2013-12-22 NOTE — Progress Notes (Signed)
   Subjective:    Patient ID: Adam Ray, male    DOB: 1955/08/21, 58 y.o.   MRN: [id number]  HPI 58 year old male presents for followup of genital warts and scrotal irritation. Patient was recently evaluated in the emergency room on 12/15/2013 for increasing irritation. Patient has previously been on Aldara and had significant irritation. These symptoms improved after discontinuation of Aldara. He was subsequently started on Podofilox which also caused irritation. In the emergency room visit on 12/15/2013 he was given doxycycline to treat possible superinfection. Patient states he has been taking this medication as prescribed. He continues to have significant irritation as well as some drainage from the area. His pain is not controlled with ibuprofen. No associated fevers or chills, or redness is not spread from the scrotal area.  The patient also reports a one-week history of rash on the right upper extremity, this area is pruritic, he denies exposure to allergen, he has not attempted any over-the-counter salves   Review of Systems  Constitutional: Negative for fever, chills and fatigue.  Genitourinary: Positive for scrotal swelling.       Objective:   Physical Exam Vitals: Reviewed General: Pleasant African American male, accompanied by his wife Skin: Approximately 8 cm x 9 cm hyperpigmented maculopapular rash of the right upper extremity, no active drainage, mild scaling, excoriations present GU: Scrotum appears irritated and is slightly erythematous, there is serosanguineous drainage present, there is no spreading erythema, mild warmth of the area, multiple genital warts present on the penis and scrotum     Assessment & Plan:  Please see problem specific assessment and plan.

## 2013-12-22 NOTE — Assessment & Plan Note (Addendum)
Patient has irritation of the scrotum secondary to use of Podofilox. There is suspected overlying superinfection however there is not concern for Forniers Gangrene at this time. -Patient to continue doxycycline twice daily -Patient to continue to hold Podofilox -Prescription for topical Bactroban given -Short course of tramadol given for pain relief -Patient to return to office in 3-5 days for follow up -Patient given precautions to go to the emergency room immediately including fevers, spreading redness, or worsening pain of the scrotal area -Given the prolonged nature of his irritation and possible progression to Forniers Gangrene will refer patient to urology

## 2013-12-22 NOTE — Assessment & Plan Note (Signed)
Hyperpigmented maculopapular rash of the right upper extremity likely consistent with allergic dermatitis. -Attempt trial of hydrocortisone cream

## 2013-12-23 ENCOUNTER — Ambulatory Visit (INDEPENDENT_AMBULATORY_CARE_PROVIDER_SITE_OTHER): Payer: Commercial Managed Care - HMO | Admitting: Family Medicine

## 2013-12-23 ENCOUNTER — Encounter: Payer: Self-pay | Admitting: Family Medicine

## 2013-12-23 VITALS — BP 124/76 | HR 83 | Temp 98.1°F | Wt 158.0 lb

## 2013-12-23 DIAGNOSIS — A630 Anogenital (venereal) warts: Secondary | ICD-10-CM

## 2013-12-23 DIAGNOSIS — IMO0002 Reserved for concepts with insufficient information to code with codable children: Secondary | ICD-10-CM

## 2013-12-23 DIAGNOSIS — IMO0001 Reserved for inherently not codable concepts without codable children: Secondary | ICD-10-CM

## 2013-12-23 DIAGNOSIS — E1165 Type 2 diabetes mellitus with hyperglycemia: Secondary | ICD-10-CM

## 2013-12-23 LAB — POCT GLYCOSYLATED HEMOGLOBIN (HGB A1C): Hemoglobin A1C: 9

## 2013-12-23 MED ORDER — DOXYCYCLINE HYCLATE 100 MG PO CAPS: 100.0000 mg | ORAL_CAPSULE | Freq: Two times a day (BID) | ORAL | Status: DC

## 2013-12-23 MED ORDER — METFORMIN HCL 1000 MG PO TABS: 1000.0000 mg | ORAL_TABLET | Freq: Two times a day (BID) | ORAL | Status: DC

## 2013-12-23 NOTE — ED Provider Notes (Signed)
Medical screening examination/treatment/procedure(s) were performed by non-physician practitioner and as supervising physician I was immediately available for consultation/collaboration.   EKG Interpretation None       Adam Ray. Alvino Chapel, MD 12/23/13 1450

## 2013-12-23 NOTE — Patient Instructions (Signed)
It was great to see you again today!  Increase your metformin to 1000mg  twice a day. I sent in a new prescription. Look at your feet every day. You are due for a diabetic eye exam. Please schedule this with your eye doctor.  I will send in a few more days of the doxycycline for your scrotum. You should also follow up with urology and dermatology. Return if any fevers or worsening of the scrotum swelling, redness, etc.  Follow up with me in 1 month for your depression and diabetes or sooner if you have any problems.  Be well, Dr. [name]

## 2013-12-23 NOTE — Progress Notes (Signed)
Patient ID: Adam Ray, male   DOB: 04/04/1956, 58 y.o.   MRN: [id number]  HPI:  Diabetes: Patient is currently taking Lantus 100 units at night. He also takes NovoLog 25 units in the afternoon. He checks his blood sugar at home. In the mornings he gets 80s to 90s. At night he has 300s to 400s. He states he never has any problems with hypoglycemia. He does not eat breakfast. He just eats one meal per day around 3 to 4 PM. He does have a history of diabetic retinopathy, but has not followed up with ophthalmology because he owes $90 to them. He will work on getting an appointment after he pays his bills. He denies any shortness of breath. He states he only gets chest pain when he is very mad, not if he walks around.  Genital warts: Has been seen by multiple providers including the emergency room over the last week. He is no longer using any topical creams on his scrotum. He was treated with doxycycline for a possible superinfection. He has finished this medicine, but requests that we continue it for another few days, as he felt like it helped a lot. He has been referred to urology, but has not yet scheduled an appointment there.  ROS: See HPI  [place]: hx DM, depression, sickle cell trait, HTN, COPD, OSA  PHYSICAL EXAM: BP 124/76  Pulse 83  Temp(Src) 98.1 F (36.7 C) (Oral)  Wt 158 lb (71.668 kg) Gen: No acute distress HEENT: Normocephalic, atraumatic Heart: Regular rate and rhythm Lungs: Clear to auscultation bilaterally, normal respiratory effort Neuro: Grossly nonfocal, speech normal GU: Scrotum mildly discolored from prior cream application and treatment. No scrotal tenderness. No active lesions. No erythema or warmth. No edema.  ASSESSMENT/PLAN:  See problem based charting for additional assessment/plan.  FOLLOW UP: F/u in one month for depression and diabetes.  [place]. [name], [place]

## 2013-12-30 NOTE — Assessment & Plan Note (Addendum)
Appears improved on exam. No current signs of superinfection. As he is strongly convinced that he needs several more days of doxycycline therapy for his scrotal infection, we'll continue this medicine for 3 days. He will followup with both urology and dermatology for this problem.

## 2013-12-30 NOTE — Assessment & Plan Note (Addendum)
A1c today is 9.0. He is reporting sugars that are in the 300s to 400s in the evening. He is currently only taking metformin once per day. I'll ask him to take it twice per day. Will likely also need to increase his insulin in the future. Followup in one month.  Cardiac: on aspirin, due for lipids in the future (did not have time to address this today) Renal: on ACE for renal protection, recent normal CKD Eye: hx diabetic retinopathy per retinal scan May 2014 (bilateral moderate nonproliferative diabetic retinopathy), advised f/u appt Foot: needs to look at feet daily since hx of osteomyelitis requiring amputation

## 2014-01-06 ENCOUNTER — Telehealth: Payer: Self-pay | Admitting: *Deleted

## 2014-01-06 DIAGNOSIS — A630 Anogenital (venereal) warts: Secondary | ICD-10-CM

## 2014-01-06 NOTE — Telephone Encounter (Signed)
Referral entered in epic.  Adam Zehnder J Antony Sian, MD  

## 2014-01-06 NOTE — Telephone Encounter (Signed)
Patient calling because he is unable to be seen at [facility] (?) due to "owing them money" and wants to change to an office here in [place].  Will route request to Dr. [name] for referral to [facility] and call patient back with appt info.  Burna Forts, BSN, RN-BC

## 2014-01-11 ENCOUNTER — Other Ambulatory Visit: Payer: Self-pay | Admitting: Family Medicine

## 2014-01-14 ENCOUNTER — Other Ambulatory Visit: Payer: Self-pay | Admitting: *Deleted

## 2014-01-19 ENCOUNTER — Encounter (HOSPITAL_COMMUNITY): Payer: Self-pay | Admitting: Emergency Medicine

## 2014-01-19 ENCOUNTER — Emergency Department (INDEPENDENT_AMBULATORY_CARE_PROVIDER_SITE_OTHER)
Admission: EM | Admit: 2014-01-19 | Discharge: 2014-01-19 | Disposition: A | Discharge status: Home / Self Care | Payer: Commercial Managed Care - HMO | Source: Home / Self Care | Attending: Family Medicine | Admitting: Family Medicine

## 2014-01-19 ENCOUNTER — Emergency Department (INDEPENDENT_AMBULATORY_CARE_PROVIDER_SITE_OTHER): Payer: Medicare HMO

## 2014-01-19 DIAGNOSIS — S46819A Strain of other muscles, fascia and tendons at shoulder and upper arm level, unspecified arm, initial encounter: Secondary | ICD-10-CM

## 2014-01-19 DIAGNOSIS — S46312A Strain of muscle, fascia and tendon of triceps, left arm, initial encounter: Secondary | ICD-10-CM

## 2014-01-19 DIAGNOSIS — S43499A Other sprain of unspecified shoulder joint, initial encounter: Secondary | ICD-10-CM

## 2014-01-19 DIAGNOSIS — X58XXXA Exposure to other specified factors, initial encounter: Secondary | ICD-10-CM

## 2014-01-19 MED ORDER — IBUPROFEN 800 MG PO TABS
ORAL_TABLET | ORAL | Status: AC
  Filled 2014-01-19: qty 1

## 2014-01-19 MED ORDER — HYDROCODONE-ACETAMINOPHEN 5-325 MG PO TABS: 1.0000 | ORAL_TABLET | ORAL | Status: DC | PRN

## 2014-01-19 MED ORDER — IBUPROFEN 800 MG PO TABS
800.0000 mg | ORAL_TABLET | Freq: Once | ORAL | Status: AC
  Administered 2014-01-19: 800 mg via ORAL

## 2014-01-19 NOTE — ED Notes (Signed)
States his left elbow has been sore since helped his son move a TV. Painful to touch, hot to touch

## 2014-01-19 NOTE — ED Provider Notes (Signed)
CSN: [id number]     Arrival date & time 01/19/14  7035 History   First MD Initiated Contact with Patient 01/19/14 1014     Chief Complaint  Patient presents with  . Elbow Problem   (Consider location/radiation/quality/duration/timing/severity/associated sxs/prior Treatment) HPI Comments: 58 year old male presents complaining of left elbow pain and swelling. He was helping his son move a 200 pound TV yesterday and last night he started to experience pain. he has severe pain just proximal to his left elbow on the back of his arm. He has difficulty moving the arm because of pain in this area. He did not have any immediate pain, this all started later after he was resting. No numbness distal to this. No previous history of elbow injury or pain. He does have a history of gout.   Past Medical History  Diagnosis Date  . Diabetes mellitus   . COPD (chronic obstructive pulmonary disease)   . Allergy   . Chronic kidney disease   . Bronchitis   . Gout   . Claudication     right foot ray resection   Past Surgical History  Procedure Laterality Date  . Surgery left great toe    . Tear ducts bilateral eyes    . I&d extremity  04/11/2012    Procedure: IRRIGATION AND DEBRIDEMENT EXTREMITY;  Surgeon: Wylene Simmer, MD;  Location: [facility];  Service: Orthopedics;  Laterality: Right;   Family History  Problem Relation Age of Onset  . Diabetes Mother   . Stroke Mother   . Heart failure Father    History  Substance Use Topics  . Smoking status: Current Every Day Smoker -- 0.30 packs/day for 48 years    Types: Cigars    Start date: 07/02/1963  . Smokeless tobacco: Current User     Comment: wants to use electric cigarettes.  not interested in pills, worried about chantix side effects  . Alcohol Use: No    Review of Systems  Musculoskeletal: Positive for arthralgias and joint swelling.  All other systems reviewed and are negative.   Allergies  Review of patient's allergies indicates no known  allergies.  Home Medications   Prior to Admission medications   Medication Sig Start Date End Date Taking? Authorizing Provider  aspirin EC 81 MG tablet Take 81 mg by mouth daily.    Historical Provider, MD  doxycycline (VIBRAMYCIN) 100 MG capsule Take 1 capsule (100 mg total) by mouth 2 (two) times daily. 12/23/13   [name], MD  furosemide (LASIX) 20 MG tablet TAKE 1 TABLET BY MOUTH EVERY DAY MAY TAKE UP TO 2 PER DAY    [name], MD  HYDROcodone-acetaminophen (NORCO) 5-325 MG per tablet Take 1 tablet by mouth every 4 (four) hours as needed for moderate pain. 01/19/14   Liam Graham, PA-C  hydrocortisone cream 0.5 % Apply 1 application topically 2 (two) times daily. 12/21/13   Lupita Dawn, MD  ibuprofen (ADVIL,MOTRIN) 200 MG tablet Take 400 mg by mouth every 6 (six) hours as needed for moderate pain.     Historical Provider, MD  insulin aspart (NOVOLOG) 100 UNIT/ML injection Inject 25 Units into the skin daily.     Historical Provider, MD  insulin glargine (LANTUS) 100 UNIT/ML injection Inject 100 Units into the skin at bedtime.    Historical Provider, MD  lidocaine (XYLOCAINE) 2 % jelly Apply 1 application topically as needed. 12/20/13   Willeen Niece, MD  metFORMIN (GLUCOPHAGE) 1000 MG tablet Take 1  tablet (1,000 mg total) by mouth 2 (two) times daily with a meal. 12/23/13   [name], MD  mupirocin ointment (BACTROBAN) 2 % Place 1 application into the nose 2 (two) times daily. 12/21/13   Lupita Dawn, MD  quinapril (ACCUPRIL) 5 MG tablet Take 0.5 tablets (2.5 mg total) by mouth daily. 11/24/12   Carolin Guernsey, MD  sildenafil (VIAGRA) 100 MG tablet Take 100 mg by mouth daily as needed for erectile dysfunction.    Historical Provider, MD  traMADol (ULTRAM) 50 MG tablet Take 1 tablet (50 mg total) by mouth every 8 (eight) hours as needed. 12/21/13   Lupita Dawn, MD   BP 139/88  Pulse 76  Temp(Src) 98.2 F (36.8 C) (Oral)  Resp 14  SpO2 99% Physical Exam   Nursing note and vitals reviewed. Constitutional: He is oriented to person, place, and time. He appears well-developed and well-nourished. No distress.  HENT:  Head: Normocephalic.  Cardiovascular:  Pulses:      Radial pulses are 2+ on the right side, and 2+ on the left side.  Pulmonary/Chest: Effort normal. No respiratory distress.  Musculoskeletal:       Left elbow: He exhibits decreased range of motion (decreased extension. Almost no ability to extend the elbow against resistance secondary to pain above the elbow) and swelling (about the extensor surface of the elbow). He exhibits no effusion, no deformity and no laceration. Tenderness (Distal portion of the triceps up to the olecranon process) found.  Neurological: He is alert and oriented to person, place, and time. Coordination normal.  Skin: Skin is warm and dry. No rash noted. He is not diaphoretic.  Psychiatric: He has a normal mood and affect. Judgment normal.    ED Course  Procedures (including critical care time) Labs Review Labs Reviewed - No data to display  Imaging Review Dg Elbow Complete Left  01/19/2014   CLINICAL DATA:  Injured elbow moving a heavy television yesterday. Pain is posterior.  EXAM: LEFT ELBOW - COMPLETE 3+ VIEW  COMPARISON:  None.  FINDINGS: No fracture. Elbow joint is normally spaced and aligned. No joint effusion.  There is edema posteriorly. The triceps tendon is not defined where it should insert upon the olecranon. Partial or complete triceps tendon rupture is suspected.  IMPRESSION: Probable triceps tendon partial or complete rupture. This could be further assessed with elbow MRI. No fracture. No elbow joint abnormality.   Electronically Signed   By: Lajean Manes M.D.   On: 01/19/2014 11:15     MDM   1. Triceps tendon rupture, left, initial encounter    Discussed with orthopedic on call. Placing patient in a posterior arm splint and sling, pain control, and they will see the patient in the  morning.  Meds ordered this encounter  Medications  . ibuprofen (ADVIL,MOTRIN) tablet 800 mg    Sig:   . HYDROcodone-acetaminophen (NORCO) 5-325 MG per tablet    Sig: Take 1 tablet by mouth every 4 (four) hours as needed for moderate pain.    Dispense:  20 tablet    Refill:  0    Order Specific Question:  Supervising Provider    Answer:  Lynne Leader, [place]     Liam Graham, PA-C 01/19/14 1247

## 2014-01-19 NOTE — Progress Notes (Signed)
Orthopedic Tech Progress Note Patient Details:  Adam Ray [birth year] [id number]  Ortho Devices Type of Ortho Device: Long arm splint;Arm sling Ortho Device/Splint Interventions: Application   Cammer, Theodoro Parma 01/19/2014, 12:50 PM

## 2014-01-20 ENCOUNTER — Ambulatory Visit (INDEPENDENT_AMBULATORY_CARE_PROVIDER_SITE_OTHER): Payer: Medicare HMO | Admitting: Family Medicine

## 2014-01-20 ENCOUNTER — Encounter: Payer: Self-pay | Admitting: Family Medicine

## 2014-01-20 VITALS — BP 155/88 | HR 62 | Temp 97.8°F | Wt 167.0 lb

## 2014-01-20 DIAGNOSIS — IMO0001 Reserved for inherently not codable concepts without codable children: Secondary | ICD-10-CM

## 2014-01-20 DIAGNOSIS — E1165 Type 2 diabetes mellitus with hyperglycemia: Secondary | ICD-10-CM

## 2014-01-20 DIAGNOSIS — F32A Depression, unspecified: Secondary | ICD-10-CM

## 2014-01-20 DIAGNOSIS — IMO0002 Reserved for concepts with insufficient information to code with codable children: Secondary | ICD-10-CM

## 2014-01-20 DIAGNOSIS — F329 Major depressive disorder, single episode, unspecified: Secondary | ICD-10-CM

## 2014-01-20 DIAGNOSIS — F3289 Other specified depressive episodes: Secondary | ICD-10-CM

## 2014-01-20 MED ORDER — METFORMIN HCL 1000 MG PO TABS: 1000.0000 mg | ORAL_TABLET | Freq: Two times a day (BID) | ORAL | Status: DC

## 2014-01-20 NOTE — Progress Notes (Signed)
Patient ID: Adam Ray, male   DOB: Apr 27, 1956, 58 y.o.   MRN: [id number]  HPI:  Depression: taking zoloft just sometimes. Breaks it in half and takes half a day, about every 3-4 days. Denies having any thoughts of hurting himself or others. Doing better mood wise than when we started this medication. States he does not want to take an entire zoloft pill. Willing to try half a pill each day.  Diabetes - taking metformin twice a day. Has not checked blood sugar in a few days. Still takes insulin as well.  ROS: See HPI  [place]: hx DM, depression, sickle cell trait, HTN, COPD, OSA  PHYSICAL EXAM: BP 155/88  Pulse 62  Temp(Src) 97.8 F (36.6 C) (Oral)  Wt 167 lb (75.751 kg) Gen: NAD HEENT: NCAT Heart: RRR Lungs: CTAB Neuro: grossly nonfocal, speech normal Psych: affect irritable, well groomed, speech normal in rate and volume, normal eye contact, denies SI/HI  ASSESSMENT/PLAN:  DM (diabetes mellitus), type 2, uncontrolled Unable to titrate medications as patient has not been checking sugars regularly over the last few days. Stressed the importance of checking sugars regularly. He will f/u in 1 month for further adjustment of diabetes medicines.  Depression Reports mood mildly improved despite only taking 25mg  zoloft every few days. Advised importance of taking medication daily for full effect. He is willing to continue with 1/2 of a pill each day. Will have him continue this medicine and f/u in 1 month to reassess.   FOLLOW UP: F/u in 1 month for depression & diabetes.  Note: pt expressed to me today that he is interested in switching to a different PCP. His primary concern was that I do not provide him with enough refills when I prescribe his medications. I advised that I can give him more refills, but that it is very important for him to follow up regularly and that limiting refills is a way of ensuring close follow up, especially given his uncontrolled diabetes. I recommended that  if he would still like to do this, he can request a PCP change form at the front desk, and that the clinic staff would be in touch with him to process this request.  [name], [place]

## 2014-01-20 NOTE — Patient Instructions (Signed)
Take the zoloft 1/2 tablet every day. I sent in more metformin for you. Take it twice a day, every day.  Follow up in 1 month.  Be well, Dr. [name]

## 2014-01-21 NOTE — ED Provider Notes (Signed)
Medical screening examination/treatment/procedure(s) were performed by a resident physician or non-physician practitioner and as the supervising physician I was immediately available for consultation/collaboration.  Lynne Leader, MD    Gregor Hams, MD 01/21/14 [phone]

## 2014-01-23 ENCOUNTER — Other Ambulatory Visit: Payer: Self-pay | Admitting: Family Medicine

## 2014-01-25 NOTE — Assessment & Plan Note (Signed)
Reports mood mildly improved despite only taking 25mg  zoloft every few days. Advised importance of taking medication daily for full effect. He is willing to continue with 1/2 of a pill each day. Will have him continue this medicine and f/u in 1 month to reassess.

## 2014-01-25 NOTE — Assessment & Plan Note (Signed)
Unable to titrate medications as patient has not been checking sugars regularly over the last few days. Stressed the importance of checking sugars regularly. He will f/u in 1 month for further adjustment of diabetes medicines.

## 2014-02-22 ENCOUNTER — Ambulatory Visit: Payer: Commercial Managed Care - HMO | Admitting: Family Medicine

## 2014-02-28 ENCOUNTER — Encounter: Payer: Self-pay | Admitting: Family Medicine

## 2014-02-28 ENCOUNTER — Ambulatory Visit (INDEPENDENT_AMBULATORY_CARE_PROVIDER_SITE_OTHER): Payer: Commercial Managed Care - HMO | Admitting: Family Medicine

## 2014-02-28 VITALS — BP 121/74 | HR 77 | Temp 98.1°F | Wt 168.0 lb

## 2014-02-28 DIAGNOSIS — IMO0002 Reserved for concepts with insufficient information to code with codable children: Secondary | ICD-10-CM

## 2014-02-28 DIAGNOSIS — E1165 Type 2 diabetes mellitus with hyperglycemia: Secondary | ICD-10-CM

## 2014-02-28 DIAGNOSIS — R609 Edema, unspecified: Secondary | ICD-10-CM

## 2014-02-28 DIAGNOSIS — IMO0001 Reserved for inherently not codable concepts without codable children: Secondary | ICD-10-CM

## 2014-02-28 DIAGNOSIS — R600 Localized edema: Secondary | ICD-10-CM

## 2014-02-28 LAB — CBC WITH DIFFERENTIAL/PLATELET
Basophils Absolute: 0 10*3/uL (ref 0.0–0.1)
Basophils Relative: 1 % (ref 0–1)
EOS ABS: 0.2 10*3/uL (ref 0.0–0.7)
Eosinophils Relative: 5 % (ref 0–5)
HEMATOCRIT: 41 % (ref 39.0–52.0)
HEMOGLOBIN: 14.4 g/dL (ref 13.0–17.0)
LYMPHS PCT: 45 % (ref 12–46)
Lymphs Abs: 1.8 10*3/uL (ref 0.7–4.0)
MCH: 29.8 pg (ref 26.0–34.0)
MCHC: 35.1 g/dL (ref 30.0–36.0)
MCV: 84.9 fL (ref 78.0–100.0)
MONOS PCT: 9 % (ref 3–12)
Monocytes Absolute: 0.4 10*3/uL (ref 0.1–1.0)
Neutro Abs: 1.6 10*3/uL — ABNORMAL LOW (ref 1.7–7.7)
Neutrophils Relative %: 40 % — ABNORMAL LOW (ref 43–77)
Platelets: 150 10*3/uL (ref 150–400)
RBC: 4.83 MIL/uL (ref 4.22–5.81)
RDW: 14.4 % (ref 11.5–15.5)
WBC: 4.1 10*3/uL (ref 4.0–10.5)

## 2014-02-28 LAB — POCT SEDIMENTATION RATE: POCT SED RATE: 8 mm/hr (ref 0–22)

## 2014-02-28 NOTE — Patient Instructions (Addendum)
It was great to see you again today!  We are checking bloodwork, xrays, and an ultrasound to check for infection and blood clot in your leg. We will call you if the results are abnormal.  Go to the emergency room if you have any increased pain or redness in the leg, or any chest pain or shortness of breath. Please follow up with me in 1 week to re-evaluate your leg and talk about diabetes.  Chart your sugars three times a day on the chart I printed off for you.  Be well, Dr. [name]

## 2014-03-01 ENCOUNTER — Ambulatory Visit (HOSPITAL_COMMUNITY)
Admission: RE | Admit: 2014-03-01 | Discharge: 2014-03-01 | Disposition: A | Discharge status: Home / Self Care | Payer: Medicare HMO | Source: Ambulatory Visit | Attending: Family Medicine | Admitting: Family Medicine

## 2014-03-01 ENCOUNTER — Encounter (HOSPITAL_COMMUNITY): Payer: Medicare HMO

## 2014-03-01 DIAGNOSIS — R609 Edema, unspecified: Secondary | ICD-10-CM | POA: Insufficient documentation

## 2014-03-01 DIAGNOSIS — S98139A Complete traumatic amputation of one unspecified lesser toe, initial encounter: Secondary | ICD-10-CM | POA: Diagnosis not present

## 2014-03-01 DIAGNOSIS — M7989 Other specified soft tissue disorders: Secondary | ICD-10-CM | POA: Insufficient documentation

## 2014-03-01 DIAGNOSIS — R600 Localized edema: Secondary | ICD-10-CM

## 2014-03-01 DIAGNOSIS — M79609 Pain in unspecified limb: Secondary | ICD-10-CM

## 2014-03-01 LAB — C-REACTIVE PROTEIN: CRP: 0.5 mg/dL (ref <0.60)

## 2014-03-01 NOTE — Progress Notes (Signed)
Patient ID: Adam Ray, male   DOB: 1955/10/19, 58 y.o.   MRN: [id number]  HPI:  R leg swelling: has had swelling of R leg/ankle for 3 weeks. Has been taking lasix 40mg  at night and 20mg  in the morning, without improvement. No redness, fevers, shortness of breath, or chest pain. Did not injure the leg. Has just some mild muscle cramping in the back of his calf but no overt pain. The leg has stayed the same level of swelling, not getting worse or better. No prior hx of blood clots. This seems similar to him as when he previously had an infection in his leg which led to needing a first ray amputation.  Has diabetes and is on insulin but has not checked sugars consistently lately.  ROS: See HPI.  [place]: hx COPD, HTN, T2DM, diabetic retinopathy, bicuspid aortic valve  PHYSICAL EXAM: BP 121/74  Pulse 77  Temp(Src) 98.1 F (36.7 C) (Oral)  Wt 168 lb (76.204 kg) Gen: NAD HEENT: NCAT Heart: RRR no murmurs Lungs: CTAB, NWOB Neuro: grossly nonfocal speech normal Ext: R lower ext with 2+ pitting edema over anterior shin & ankle. No posterior calf tenderness or palpable cords. No erythema or warmth. 2+ DP pulse on L, 1+ DP pulse on R. Full ROM of R ankle.   ASSESSMENT/PLAN:  LEG EDEMA Subacute RLE edema. Concern for possible DVT, infection, or just venous insufficiency. Hemodynamically stable without signs of PE at this time. Plan: - obtain RLE venous doppler to rule out DVT - xray of R ankle, foot, and tibia/fibula - check CBC, sed rate, CRP - f/u 1 week for recheck, sooner if symptoms worsen - Precepted with Dr. Lindell Noe who also examined patient and agrees with this plan.   DM (diabetes mellitus), type 2, uncontrolled Again unable to titrate insulin as pt not checking sugars consistently. Printed off CBG chart for him and will have him return for insulin titration at next appt.   FOLLOW UP: F/u in 1 week for recheck leg, titration of insulin.  [name]. [name], [place]

## 2014-03-01 NOTE — Progress Notes (Signed)
VASCULAR LAB PRELIMINARY  PRELIMINARY  PRELIMINARY  PRELIMINARY  Right lower extremity venous duplex completed.    Preliminary report:  Right:  No evidence of DVT, superficial thrombosis, or Baker's cyst.  Arnika Larzelere, RVS 03/01/2014, 4:02 PM

## 2014-03-08 NOTE — Assessment & Plan Note (Signed)
Again unable to titrate insulin as pt not checking sugars consistently. Printed off CBG chart for him and will have him return for insulin titration at next appt.

## 2014-03-08 NOTE — Assessment & Plan Note (Addendum)
Subacute RLE edema. Concern for possible DVT, infection, or just venous insufficiency. Hemodynamically stable without signs of PE at this time. Plan: - obtain RLE venous doppler to rule out DVT - xray of R ankle, foot, and tibia/fibula - check CBC, sed rate, CRP - f/u 1 week for recheck, sooner if symptoms worsen - Precepted with Dr. Lindell Noe who also examined patient and agrees with this plan.

## 2014-03-09 ENCOUNTER — Ambulatory Visit: Payer: Medicare HMO | Admitting: Family Medicine

## 2014-03-21 ENCOUNTER — Ambulatory Visit (INDEPENDENT_AMBULATORY_CARE_PROVIDER_SITE_OTHER): Payer: Commercial Managed Care - HMO | Admitting: Family Medicine

## 2014-03-21 ENCOUNTER — Encounter: Payer: Self-pay | Admitting: Family Medicine

## 2014-03-21 VITALS — BP 119/74 | HR 62 | Temp 98.0°F | Ht 70.0 in | Wt 167.1 lb

## 2014-03-21 DIAGNOSIS — M545 Low back pain, unspecified: Secondary | ICD-10-CM

## 2014-03-21 DIAGNOSIS — R609 Edema, unspecified: Secondary | ICD-10-CM

## 2014-03-21 DIAGNOSIS — A630 Anogenital (venereal) warts: Secondary | ICD-10-CM

## 2014-03-21 DIAGNOSIS — I872 Venous insufficiency (chronic) (peripheral): Secondary | ICD-10-CM

## 2014-03-21 NOTE — Patient Instructions (Signed)

## 2014-03-21 NOTE — Assessment & Plan Note (Signed)
Patient reports right sided lumbar back pain a few weeks ago. No associated neurologic symptoms. He is currently pain free. Normal exam today. -monitor clinically

## 2014-03-21 NOTE — Assessment & Plan Note (Signed)
Resolved s/p cryotherapy.

## 2014-03-21 NOTE — Assessment & Plan Note (Signed)
Bilateral leg edema (R>L). No acute infection. No acute DVT. Renal and LFT's stable. Suspect due to venous insufficiency.  -prescription for compression stockings provided

## 2014-03-21 NOTE — Progress Notes (Signed)
   Subjective:    Patient ID: Adam Ray, male    DOB: Sep 28, 1955, 58 y.o.   MRN: [id number]  HPI 58 y/o male presents for follow up of right foot/ankle swelling.  Right foot/ankle swelling - evaluated on 02/28/14 by PCP, sent for xray and venous dopplers (see results below), continues to have swelling in both lower extremities (right > left), no redness, no fevers/chills, no pain  Low back pain - patient reports that he had low back pain a few weeks ago, lasted 1-2 weeks then resolved, no current pain, no radiation down legs, no bladder/bowel incontinence, he states that he had xrays of his back done at [facility] in the past month (none present in records), asked PCP about this and she stated that she did not order back films  Genital warts - patient currently not on any topical medications, had cryotherapy performed at another facility   Review of Systems  Constitutional: Negative for fever, chills and fatigue.  Respiratory: Negative for cough and shortness of breath.   Cardiovascular: Positive for leg swelling. Negative for chest pain.  Gastrointestinal: Negative for nausea, vomiting and diarrhea.       Objective:   Physical Exam Vitals: reviewed Gen: pleasant AAM, NAD Ext: 1+ edema right LE, trace edema left lower extremity, 2+ DP and PT pulses in left LE, unable to palpate DP pulse in right LE (may be due to previous first digit amputation, also unable to find on doppler), unable to palpate right PT pulse due to swelling (strong on doppler), bilateral feet are warm to touch, no erythema, normal capillary refill MSK: no lumbar back tenderness Neuro: strength 5/5 in bilateral LE, sensation to light touch intact  Imaging and Lab work from 02/28/14  Xray right foot/ankle - 1st digit amputation, chronic erosive changes of distal second digit, no acute fracture or osteomyelitis, soft tissue swelling present  Right LE venous doppler - negative for DVT  ESR/CRP wnl  CBC -  WBC 4.1, Hemoglobin 14.4, Platelets 150  Previous CMP identified normal LFT's     Assessment & Plan:  Please see problem specific assessment and plan.

## 2014-03-22 ENCOUNTER — Encounter: Payer: Self-pay | Admitting: *Deleted

## 2014-03-22 NOTE — Progress Notes (Signed)
Pt in nurse clinic for triage.  Pt requesting blood work for HIV testing.  Pt asked if he wanted a complete STD check or just blood work.  Pt stated he wanted to be check for everything.  Appt 03/23/2014 at 9:45 AM.  Derl Barrow, RN

## 2014-03-23 ENCOUNTER — Ambulatory Visit (INDEPENDENT_AMBULATORY_CARE_PROVIDER_SITE_OTHER): Payer: Commercial Managed Care - HMO | Admitting: Family Medicine

## 2014-03-23 ENCOUNTER — Encounter: Payer: Self-pay | Admitting: Family Medicine

## 2014-03-23 ENCOUNTER — Other Ambulatory Visit (HOSPITAL_COMMUNITY)
Admission: RE | Admit: 2014-03-23 | Discharge: 2014-03-23 | Disposition: A | Discharge status: Home / Self Care | Payer: Medicare HMO | Source: Ambulatory Visit | Attending: Family Medicine | Admitting: Family Medicine

## 2014-03-23 VITALS — BP 144/79 | HR 59 | Temp 98.0°F | Ht 70.0 in | Wt 166.0 lb

## 2014-03-23 DIAGNOSIS — Z113 Encounter for screening for infections with a predominantly sexual mode of transmission: Secondary | ICD-10-CM

## 2014-03-23 DIAGNOSIS — Z202 Contact with and (suspected) exposure to infections with a predominantly sexual mode of transmission: Secondary | ICD-10-CM

## 2014-03-23 LAB — RPR

## 2014-03-23 NOTE — Patient Instructions (Addendum)
Nice to see you. We will call you with the results of your blood work.

## 2014-03-23 NOTE — Assessment & Plan Note (Signed)
Patient requesting testing for STDs at this time. He is asymptomatic, has no signs of infection on exam, and has had no new sexual encounters in the past year. Will check HIV and RPR. Will check urine GC/chlamydia. Will call patient with results.

## 2014-03-23 NOTE — Progress Notes (Signed)
Patient ID: Adam Ray, male   DOB: Sep 25, 1955, 58 y.o.   MRN: [id number]  Tommi Rumps, MD Phone: [phone]  Adam Ray is a 58 y.o. male who presents today for same day appointment.  Concern for STD: patient presents today asking for STD testing. He notes he has not had sexual activity in the past year, though her wants to get checked for everything that he could possible have. He denies any symptoms today. He has no discharge or rash. He notes a history of genital wart, gonorrhea, and crabs. He sometimes uses condoms when sexually active.   Patient is a smoker.   ROS: Per HPI   Physical Exam Filed Vitals:   03/23/14 0956  BP: 144/79  Pulse: 59  Temp: 98 F (36.7 C)    Gen: Well NAD HEENT: PERRL,  MMM, no OP erythema or exudate Lungs: CTABL Nl WOB Heart: RRR no MRG GU: normal male penis and testicles, normal vas deferens to palpation, no penile discharge on exam Exts: Non edematous BL  LE, warm and well perfused.    Assessment/Plan: Please see individual problem list.   Tommi Rumps, MD [place] PGY-3'

## 2014-03-24 LAB — URINE CYTOLOGY ANCILLARY ONLY
Chlamydia: NEGATIVE
Neisseria Gonorrhea: NEGATIVE

## 2014-03-24 LAB — HIV ANTIBODY (ROUTINE TESTING W REFLEX): HIV: NONREACTIVE

## 2014-03-25 ENCOUNTER — Ambulatory Visit (INDEPENDENT_AMBULATORY_CARE_PROVIDER_SITE_OTHER): Payer: Commercial Managed Care - HMO | Admitting: *Deleted

## 2014-03-25 DIAGNOSIS — Z23 Encounter for immunization: Secondary | ICD-10-CM

## 2014-04-07 ENCOUNTER — Encounter: Payer: Self-pay | Admitting: Family Medicine

## 2014-04-07 NOTE — Progress Notes (Signed)
Patient wants to know if the paper has been sent yet for him to get diabetic shoes.

## 2014-04-12 ENCOUNTER — Telehealth: Payer: Self-pay | Admitting: Family Medicine

## 2014-04-12 DIAGNOSIS — E08319 Diabetes mellitus due to underlying condition with unspecified diabetic retinopathy without macular edema: Secondary | ICD-10-CM

## 2014-04-12 NOTE — Telephone Encounter (Signed)
Please place referral. Kennon Holter, [place] CMA

## 2014-04-12 NOTE — Telephone Encounter (Signed)
Pt is requesting a referral to see Dr. Katy Fitch and eye specialist. Blima Rich

## 2014-04-12 NOTE — Telephone Encounter (Signed)
Referral ordered. Thanks, [name], MD

## 2014-04-14 NOTE — Progress Notes (Signed)
I just filled out forms for his shoes less than 1 year ago (Dec/Jan I think). Not eligible for shoes until a year has passed.  [name], MD

## 2014-04-15 NOTE — Progress Notes (Signed)
Left message informing patient.

## 2014-04-28 ENCOUNTER — Emergency Department (INDEPENDENT_AMBULATORY_CARE_PROVIDER_SITE_OTHER)
Admission: EM | Admit: 2014-04-28 | Discharge: 2014-04-28 | Discharge status: Against Medical Advice | Payer: Medicare HMO | Source: Home / Self Care | Attending: Family Medicine | Admitting: Family Medicine

## 2014-04-28 ENCOUNTER — Encounter (HOSPITAL_COMMUNITY): Payer: Self-pay | Admitting: Emergency Medicine

## 2014-04-28 DIAGNOSIS — R059 Cough, unspecified: Secondary | ICD-10-CM

## 2014-04-28 DIAGNOSIS — R05 Cough: Secondary | ICD-10-CM

## 2014-04-28 MED ORDER — IPRATROPIUM-ALBUTEROL 0.5-2.5 (3) MG/3ML IN SOLN
3.0000 mL | Freq: Once | RESPIRATORY_TRACT | Status: AC
  Administered 2014-04-28: 3 mL via RESPIRATORY_TRACT

## 2014-04-28 MED ORDER — IPRATROPIUM-ALBUTEROL 0.5-2.5 (3) MG/3ML IN SOLN
RESPIRATORY_TRACT | Status: AC
  Filled 2014-04-28: qty 3

## 2014-04-28 NOTE — ED Notes (Signed)
Pt states that he has a cough and want to r/o PNA. Pt is in no acute distress at this time. Pt denies any pain.

## 2014-04-28 NOTE — ED Provider Notes (Signed)
Adam Ray is a 58 y.o. male who presents to Urgent Care today for cough. Patient has a persistent cough present for 3 weeks. He notes the cough is productive. No significant wheezing. No fevers or chills. No nausea vomiting or diarrhea. He has tried over-the-counter medications which have not helped much.   Past Medical History  Diagnosis Date  . Diabetes mellitus   . COPD (chronic obstructive pulmonary disease)   . Allergy   . Chronic kidney disease   . Bronchitis   . Gout   . Claudication     right foot ray resection   History  Substance Use Topics  . Smoking status: Current Every Day Smoker -- 0.30 packs/day for 48 years    Types: Cigars    Start date: 07/02/1963  . Smokeless tobacco: Current User     Comment: wants to use electric cigarettes.  not interested in pills, worried about chantix side effects  . Alcohol Use: No   ROS as above Medications: Current Facility-Administered Medications  Medication Dose Route Frequency Provider Last Rate Last Dose  . ipratropium-albuterol (DUONEB) 0.5-2.5 (3) MG/3ML nebulizer solution 3 mL  3 mL Nebulization Once Gregor Hams, MD       Current Outpatient Prescriptions  Medication Sig Dispense Refill  . aspirin EC 81 MG tablet Take 81 mg by mouth daily.      . furosemide (LASIX) 20 MG tablet TAKE 1 TABLET BY MOUTH EVERY DAY MAY TAKE UP TO 2 PER DAY  30 tablet  2  . HYDROcodone-acetaminophen (NORCO) 5-325 MG per tablet Take 1 tablet by mouth every 4 (four) hours as needed for moderate pain.  20 tablet  0  . hydrocortisone cream 0.5 % Apply 1 application topically 2 (two) times daily.  30 g  0  . ibuprofen (ADVIL,MOTRIN) 200 MG tablet Take 400 mg by mouth every 6 (six) hours as needed for moderate pain.       [name] insulin aspart (NOVOLOG) 100 UNIT/ML injection Inject 25 Units into the skin daily.       . insulin glargine (LANTUS) 100 UNIT/ML injection Inject 1 mL (100 Units total) into the skin at bedtime.  50 mL  5  . metFORMIN (GLUCOPHAGE)  1000 MG tablet Take 1 tablet (1,000 mg total) by mouth 2 (two) times daily with a meal.  60 tablet  5  . mupirocin ointment (BACTROBAN) 2 % Place 1 application into the nose 2 (two) times daily.  22 g  1  . quinapril (ACCUPRIL) 5 MG tablet Take 0.5 tablets (2.5 mg total) by mouth daily.  30 tablet  11  . sildenafil (VIAGRA) 100 MG tablet Take 100 mg by mouth daily as needed for erectile dysfunction.      . traMADol (ULTRAM) 50 MG tablet Take 1 tablet (50 mg total) by mouth every 8 (eight) hours as needed.  10 tablet  0  . [DISCONTINUED] levofloxacin (LEVAQUIN) 750 MG tablet Take 1 tablet (750 mg total) by mouth daily. For 14 days  14 tablet  0    Exam:  BP 121/68  Pulse 68  Temp(Src) 97.8 F (36.6 C) (Oral)  Resp 16  SpO2 100% Gen: Well NAD HEENT: EOMI,  MMM posterior pharynx with cobblestoning. Normal tympanic membranes bilaterally. Lungs: Normal work of breathing. Coarse breath sounds throughout Heart: RRR no MRG Abd: NABS, Soft. Nondistended, Nontender Exts: Brisk capillary refill, warm and well perfused.   Patient received a DuoNeb nebulizer treatment and did not improve. Nebulizer containing  2.5 mg of albuterol and 0.5 mg of Atrovent.   Assessment and Plan: 58 y.o. male with a chest x-ray was ordered however patient abruptly left AMA stating that he did not want to wait any longer. The nurse discussed the risks and benefits of leaving. Patient expresses understanding and agreement.       Gregor Hams, MD 04/28/14 1556

## 2014-04-29 ENCOUNTER — Ambulatory Visit (INDEPENDENT_AMBULATORY_CARE_PROVIDER_SITE_OTHER): Payer: Medicare HMO | Admitting: Family Medicine

## 2014-04-29 ENCOUNTER — Encounter: Payer: Self-pay | Admitting: Family Medicine

## 2014-04-29 VITALS — BP 150/84 | HR 76 | Temp 98.4°F | Wt 166.0 lb

## 2014-04-29 DIAGNOSIS — J069 Acute upper respiratory infection, unspecified: Secondary | ICD-10-CM

## 2014-04-29 DIAGNOSIS — J449 Chronic obstructive pulmonary disease, unspecified: Secondary | ICD-10-CM

## 2014-04-29 MED ORDER — GUAIFENESIN 200 MG PO TABS: 200.0000 mg | ORAL_TABLET | ORAL | Status: DC | PRN

## 2014-04-29 MED ORDER — ALBUTEROL SULFATE HFA 108 (90 BASE) MCG/ACT IN AERS: 2.0000 | INHALATION_SPRAY | Freq: Four times a day (QID) | RESPIRATORY_TRACT | Status: DC | PRN

## 2014-04-29 NOTE — Patient Instructions (Signed)
Great to see you today  You have a bad cold or the flu.   Be sure to get plenty of fluids and seek medical help if you develop fevers, shortness of breath, or if your cough worsens or become productive.   Come back Monday morning if you are getting worse

## 2014-04-29 NOTE — Assessment & Plan Note (Addendum)
Pt with chills X 1 night, congestion, and myalgias Denies productive cough or dyspnea, would have low threshold for COPD exacerbation Tx  Consider flu or viral etiology as most likely etiology at this point Non toxic appearing Close fopllow up if not improving guaifenesin for cough, albuterol given per request but no wheezing and non labored on exam.

## 2014-04-29 NOTE — Progress Notes (Signed)
Patient ID: Adam Ray, male   DOB: [birth year], 58 y.o.   MRN: [id number]   HPI  Patient presents today for chills and myalgias  Pt state sthat for the last week he has had graduually worsening chills, cough, sneezing, congestion, headache, muscle aches, and blurring vision. He report normal PO intake. He has used dayquil and nyquil with minimal relief.   He denies chest pain, dyspnea, and productive cough.   He states that he does have a cough but it is mild and non productive.   He has had a flu shot this season.   Smoking status noted ROS: Per HPI  Objective: BP 150/84  Pulse 76  Temp(Src) 98.4 F (36.9 C) (Oral)  Wt 166 lb (75.297 kg)  SpO2 98% Gen: NAD, alert, cooperative with exam, well appearing HEENT: NCAT CV: RRR, good S1/S2, Split S1 no murmur Resp: CTABL, no wheezes, non-labored Neuro: Alert and oriented, No gross deficits  Assessment and plan:  URI (upper respiratory infection) Pt with chills X 1 night, congestion, and myalgias Denies productive cough or dyspnea, would have low threshold for COPD exacerbation Tx  Consider flu or viral etiology as most likely etiology at this point Non toxic appearing Close fopllow up if not improving guaifenesin for cough, albuterol given per request but no wheezing and non labored on exam.   COPD, mild Given Rx for Albuterol per request Lung exam benign      Meds ordered this encounter  Medications  . guaiFENesin 200 MG tablet    Sig: Take 1 tablet (200 mg total) by mouth every 4 (four) hours as needed for cough or to loosen phlegm.    Dispense:  30 suppository    Refill:  0  . albuterol (PROVENTIL HFA;VENTOLIN HFA) 108 (90 BASE) MCG/ACT inhaler    Sig: Inhale 2 puffs into the lungs every 6 (six) hours as needed for wheezing or shortness of breath.    Dispense:  1 Inhaler    Refill:  0

## 2014-04-29 NOTE — Assessment & Plan Note (Signed)
Given Rx for Albuterol per request Lung exam benign

## 2014-05-02 ENCOUNTER — Encounter: Payer: Self-pay | Admitting: Family Medicine

## 2014-05-02 ENCOUNTER — Ambulatory Visit (INDEPENDENT_AMBULATORY_CARE_PROVIDER_SITE_OTHER): Payer: Medicare HMO | Admitting: Family Medicine

## 2014-05-02 VITALS — BP 126/83 | HR 80 | Temp 98.6°F | Ht 70.0 in | Wt 161.8 lb

## 2014-05-02 DIAGNOSIS — J069 Acute upper respiratory infection, unspecified: Secondary | ICD-10-CM

## 2014-05-02 DIAGNOSIS — E1165 Type 2 diabetes mellitus with hyperglycemia: Secondary | ICD-10-CM

## 2014-05-02 DIAGNOSIS — IMO0002 Reserved for concepts with insufficient information to code with codable children: Secondary | ICD-10-CM

## 2014-05-02 NOTE — Patient Instructions (Signed)
It was great to see you again today!  I'm glad you are overall feeling better. Return if you get worse. Fill out the blood sugar chart and follow up with me in a few weeks for your diabetes.  Be well, Dr. [name]

## 2014-05-05 ENCOUNTER — Emergency Department (INDEPENDENT_AMBULATORY_CARE_PROVIDER_SITE_OTHER)
Admission: EM | Admit: 2014-05-05 | Discharge: 2014-05-05 | Disposition: A | Discharge status: Home / Self Care | Payer: Medicare HMO | Source: Home / Self Care | Attending: Family Medicine | Admitting: Family Medicine

## 2014-05-05 ENCOUNTER — Encounter (HOSPITAL_COMMUNITY): Payer: Self-pay | Admitting: Emergency Medicine

## 2014-05-05 DIAGNOSIS — A630 Anogenital (venereal) warts: Secondary | ICD-10-CM

## 2014-05-05 MED ORDER — PODOFILOX 0.5 % EX GEL: Freq: Two times a day (BID) | CUTANEOUS | Status: DC

## 2014-05-05 NOTE — ED Notes (Signed)
Reports possible wart/lesion on penis. Noticed about two weeks ago.  Denies penile discharge or any pelvic/abdominal pain.  No otc treatments tried.

## 2014-05-05 NOTE — ED Provider Notes (Signed)
CSN: [id number]     Arrival date & time 05/05/14  1748 History   First MD Initiated Contact with Patient 05/05/14 1813     Chief Complaint  Patient presents with  . Genital Warts   (Consider location/radiation/quality/duration/timing/severity/associated sxs/prior Treatment) HPI  Genital warts: initially came up last September. Started on medicine (Podofilox) from [facility] at that time w/ resolution. Started coming back around the base of the penis. First noticed earlier this week. Shaves in the pubic area. Wife states they look like cauliflower. Non-painful. Denies penile discharge, lymphadenopathy, pain.    Past Medical History  Diagnosis Date  . Diabetes mellitus   . COPD (chronic obstructive pulmonary disease)   . Allergy   . Chronic kidney disease   . Bronchitis   . Gout   . Claudication     right foot ray resection   Past Surgical History  Procedure Laterality Date  . Surgery left great toe    . Tear ducts bilateral eyes    . I&d extremity  04/11/2012    Procedure: IRRIGATION AND DEBRIDEMENT EXTREMITY;  Surgeon: Wylene Simmer, MD;  Location: [facility];  Service: Orthopedics;  Laterality: Right;   Family History  Problem Relation Age of Onset  . Diabetes Mother   . Stroke Mother   . Heart failure Father    History  Substance Use Topics  . Smoking status: Current Every Day Smoker -- 0.30 packs/day for 48 years    Types: Cigars    Start date: 07/02/1963  . Smokeless tobacco: Current User     Comment: wants to use electric cigarettes.  not interested in pills, worried about chantix side effects  . Alcohol Use: No    Review of Systems Per HPI with all other pertinent systems negative.   Allergies  Review of patient's allergies indicates no known allergies.  Home Medications   Prior to Admission medications   Medication Sig Start Date End Date Taking? Authorizing Provider  aspirin EC 81 MG tablet Take 81 mg by mouth daily.   Yes Historical Provider, MD  furosemide  (LASIX) 20 MG tablet TAKE 1 TABLET BY MOUTH EVERY DAY MAY TAKE UP TO 2 PER DAY   Yes [name], MD  insulin aspart (NOVOLOG) 100 UNIT/ML injection Inject 25 Units into the skin daily.    Yes Historical Provider, MD  insulin glargine (LANTUS) 100 UNIT/ML injection Inject 1 mL (100 Units total) into the skin at bedtime.   Yes [name], MD  metFORMIN (GLUCOPHAGE) 1000 MG tablet Take 1 tablet (1,000 mg total) by mouth 2 (two) times daily with a meal. 01/20/14  Yes [name], MD  quinapril (ACCUPRIL) 5 MG tablet Take 0.5 tablets (2.5 mg total) by mouth daily. 11/24/12  Yes Carolin Guernsey, MD  albuterol (PROVENTIL HFA;VENTOLIN HFA) 108 (90 BASE) MCG/ACT inhaler Inhale 2 puffs into the lungs every 6 (six) hours as needed for wheezing or shortness of breath. 04/29/14   Timmothy Euler, MD  guaiFENesin 200 MG tablet Take 1 tablet (200 mg total) by mouth every 4 (four) hours as needed for cough or to loosen phlegm. 04/29/14   Timmothy Euler, MD  HYDROcodone-acetaminophen (NORCO) 5-325 MG per tablet Take 1 tablet by mouth every 4 (four) hours as needed for moderate pain. 01/19/14   Liam Graham, PA-C  hydrocortisone cream 0.5 % Apply 1 application topically 2 (two) times daily. 12/21/13   Lupita Dawn, MD  ibuprofen (ADVIL,MOTRIN) 200 MG tablet Take  400 mg by mouth every 6 (six) hours as needed for moderate pain.     Historical Provider, MD  mupirocin ointment (BACTROBAN) 2 % Place 1 application into the nose 2 (two) times daily. 12/21/13   Lupita Dawn, MD  podofilox (CONDYLOX) 0.5 % gel Apply topically 2 (two) times daily. Treat for 3 days then stop for 4 days. Repeat cycle up to 4 times 05/05/14   Waldemar Dickens, MD  sildenafil (VIAGRA) 100 MG tablet Take 100 mg by mouth daily as needed for erectile dysfunction.    Historical Provider, MD  traMADol (ULTRAM) 50 MG tablet Take 1 tablet (50 mg total) by mouth every 8 (eight) hours as needed. 12/21/13   Lupita Dawn, MD   BP  132/92 mmHg  Pulse 79  Temp(Src) 98.7 F (37.1 C) (Oral)  Resp 14  SpO2 97% Physical Exam  Constitutional: He is oriented to person, place, and time. He appears well-developed and well-nourished.  HENT:  Head: Normocephalic and atraumatic.  Eyes: EOM are normal. Pupils are equal, round, and reactive to light.  Neck: Normal range of motion.  Pulmonary/Chest: Effort normal. No respiratory distress.  Abdominal: He exhibits no distension.  Genitourinary:  Penis shaft w/ L sided few scattered wart like projections, no groin lymphadenopathy. No open lesions. No penile discharge  Musculoskeletal: Normal range of motion. He exhibits no tenderness.  Neurological: He is alert and oriented to person, place, and time. No cranial nerve deficit.  Skin: Skin is warm.  Psychiatric: He has a normal mood and affect. His behavior is normal. Thought content normal.    ED Course  Procedures (including critical care time) Labs Review Labs Reviewed - No data to display  Imaging Review No results found.   MDM   1. Penile venereal warts     Podofilox Precautions given and all questions answered  Linna Darner, MD Family Medicine 05/05/2014, 6:40 PM       Waldemar Dickens, MD 05/05/14 1840

## 2014-05-05 NOTE — Discharge Instructions (Signed)
You have genital warts Please use the Podofilox to treat the spots Please come back as needed

## 2014-05-08 NOTE — Assessment & Plan Note (Signed)
Improving. Continue supportive care with prn albuterol, guaifenesin. Return if not cointinuin gto get better.

## 2014-05-08 NOTE — Progress Notes (Signed)
Patient ID: Adam Ray, male   DOB: May 23, 1956, 58 y.o.   MRN: [id number]  HPI:  Pt presents today to f/u on his URI. Saw Dr. Wendi Ray several days ago for this and was thought to be viral. Pt reports he is overall improving. Still has a cough at night. No fever. Guaifenesin is helping. Also using albuterol, which helps some too. Otherwise has no complaints today.  Of note pt has been checking CBG's but did not bring his record in today and is not able to recall the values accurately. Will defer diabetes to future visit. He does request a referral to Dr. Katy Ray for his eyes (has hx of diabetic retinopathy) and that I complete paperwork for him to get another set of diabetic shoes.  ROS: See HPI.  Gu [place]: hx DM, HTN, bicuspid aortic valve, COPD, diabetic retinopathy  PHYSICAL EXAM: BP 126/83 mmHg  Pulse 80  Temp(Src) 98.6 F (37 C) (Oral)  Ht 5\' 10"  (1.778 m)  Wt 161 lb 12.8 oz (73.392 kg)  BMI 23.22 kg/m2 Gen: NAD HEENT: NCAT Heart: RRR Lungs: CTAB, NWOB, no crackles or wheezes Neuro: grossly nonfocal speech normal Ext: No appreciable lower extremity edema bilaterally   ASSESSMENT/PLAN:  URI (upper respiratory infection) Improving. Continue supportive care with prn albuterol, guaifenesin. Return if not cointinuin gto get better.  DM (diabetes mellitus), type 2, uncontrolled Unable to titrate insulin as did not bring blood sugar chart. Will have him return for separate visit to do this.   For diabetic retinopathy, upon chart review I have already entered a referral to Dr. Katy Ray (pt states he needs this).  Will complete paperwork for him to get diabetic shoes.   FOLLOW UP: F/u in a couple weeks for diabetes.  [place]. [name], [place]

## 2014-05-08 NOTE — Assessment & Plan Note (Addendum)
Unable to titrate insulin as did not bring blood sugar chart. Will have him return for separate visit to do this.   For diabetic retinopathy, upon chart review I have already entered a referral to Dr. Katy Fitch (pt states he needs this).  Will complete paperwork for him to get diabetic shoes.

## 2014-05-23 ENCOUNTER — Encounter: Payer: Self-pay | Admitting: Family Medicine

## 2014-05-23 ENCOUNTER — Ambulatory Visit (INDEPENDENT_AMBULATORY_CARE_PROVIDER_SITE_OTHER): Payer: Medicare HMO | Admitting: Family Medicine

## 2014-05-23 VITALS — BP 132/83 | HR 70 | Temp 98.5°F | Ht 71.0 in | Wt 163.1 lb

## 2014-05-23 DIAGNOSIS — A630 Anogenital (venereal) warts: Secondary | ICD-10-CM

## 2014-05-23 MED ORDER — PODOFILOX 0.5 % EX GEL: Freq: Two times a day (BID) | CUTANEOUS | Status: DC

## 2014-05-23 NOTE — Progress Notes (Signed)
   Subjective:  Adam Ray is a 58 y.o. male presenting to same day clinic for evaluation of genital warts  These were first noticed in Sept of this year and have improved greatly with cryosurgery as well as topical medications given since then. They are not irritating him in any way and the only area he believes is still affected is small near the left base of the penis. He has not been sexually active. + History of STI's which he was treated for, though he doesn't recall which specifically. They are most bothersome to a potential partner of his who is present and would like advice on transmissability of these lesions.   - Review of Systems: Per HPI.  - Smoking status noted Objective:  BP 132/83 mmHg  Pulse 70  Temp(Src) 98.5 F (36.9 C) (Oral)  Ht 5\' 11"  (1.803 m)  Wt 163 lb 1.6 oz (73.982 kg)  BMI 22.76 kg/m2 Gen:  58 y.o. male in no distress Physical Examination: Eyes - sclera anicteric, no evidence of iritis GU Male - no penile lesions or discharge, no testicular masses or tenderness, no hernias, PENIS: normal without lesions or discharge, SCROTUM: approximately 42mm skin-colored lesion on anterior superior left scrotum without pain, discharge or erythema.  Skin - normal coloration, no other rashes, no suspicious skin lesions noted  Assessment/Plan:  Adam Ray is a 58 y.o. male here for condyloma, improving. Refill podofilox and advised to use this as directed until lesions are gone. Educational material given to pt. Advised that condylomata are transmissable by contact and may be transmitted even without symptoms. Follow up prn.

## 2014-05-23 NOTE — Patient Instructions (Signed)
Genital Warts Genital warts are a sexually transmitted infection. They may appear as small bumps on the tissues of the genital area. CAUSES  Genital warts are caused by a virus called human papillomavirus (HPV). HPV is the most common sexually transmitted disease (STD) and infection of the sex organs. This infection is spread by having unprotected sex with an infected person. It can be spread by vaginal, anal, and oral sex. Many people do not know they are infected. They may be infected for years without problems.  SYMPTOMS   Itching and irritation in the genital area.  Warts that bleed.  Painful sexual intercourse. DIAGNOSIS  Warts are usually recognized with the naked eye on the vagina, vulva, perineum, anus, and rectum. Certain tests can also diagnose genital warts, such as:  A Pap test.  A tissue sample (biopsy) exam.  Colposcopy. A magnifying tool is used to examine the vagina and cervix. The HPV cells will change color when certain solutions are used. TREATMENT  Warts can be removed by:  Applying certain chemicals, such as cantharidin or podophyllin.  Liquid nitrogen freezing (cryotherapy).  Immunotherapy with Candida or Trichophyton injections.  Laser treatment.  Burning with an electrified probe (electrocautery).  Interferon injections.  Surgery. PREVENTION  HPV vaccination can help prevent HPV infections that cause genital warts and that cause cancer of the cervix. It is recommended that the vaccination be given to people between the ages 40 to 70 years old. The vaccine might not work as well or might not work at all if you already have HPV. It should not be given to pregnant women. HOME CARE INSTRUCTIONS   It is important to follow your caregiver's instructions. The warts will not go away without treatment. Repeat treatments are often needed to get rid of warts. Even after it appears that the warts are gone, the normal tissue underneath often remains infected.  Do  not try to treat genital warts with medicine used to treat hand warts. This type of medicine is strong and can burn the skin in the genital area, causing more damage.  Tell your past and current sexual partner(s) that you have genital warts. They may be infected also and need treatment.  Avoid sexual contact while being treated.  Do not touch or scratch the warts. The infection may spread to other parts of your body.  Women with genital warts should have a cervical cancer check (Pap test) at least once a year. This type of cancer is slow-growing and can be cured if found early. Chances of developing cervical cancer are increased with HPV.  Inform your obstetrician about your warts in the event of pregnancy. This virus can be passed to the baby's respiratory tract. Discuss this with your caregiver.  Use a condom during sexual intercourse. Following treatment, the use of condoms will help prevent reinfection.  Ask your caregiver about using over-the-counter anti-itch creams. SEEK MEDICAL CARE IF:   Your treated skin becomes red, swollen, or painful.  You have a fever.  You feel generally ill.  You feel little lumps in and around your genital area.  You are bleeding or have painful sexual intercourse. MAKE SURE YOU:   Understand these instructions.  Will watch your condition.  Will get help right away if you are not doing well or get worse. Document Released: 06/14/2000 Document Revised: 11/01/2013 Document Reviewed: 12/24/2010 [facility] Patient Information 2015 [place], [place]. This information is not intended to replace advice given to you by your health care provider. Make sure  you discuss any questions you have with your health care provider.

## 2014-05-24 NOTE — Assessment & Plan Note (Signed)
Improving and essentially gone on exam today though pt would like to continue treatment for definitive cure. Counseled that absence of lesions does not equal cure and that transmission may occur at any time. Appropriate precautions reviewed.  - Refilled podofilox.  - Follow up prn

## 2014-05-28 ENCOUNTER — Encounter (HOSPITAL_COMMUNITY): Payer: Self-pay | Admitting: *Deleted

## 2014-05-28 ENCOUNTER — Emergency Department (HOSPITAL_COMMUNITY)
Admission: EM | Admit: 2014-05-28 | Discharge: 2014-05-28 | Disposition: A | Discharge status: Home / Self Care | Payer: Medicare HMO | Attending: Emergency Medicine | Admitting: Emergency Medicine

## 2014-05-28 DIAGNOSIS — Z7982 Long term (current) use of aspirin: Secondary | ICD-10-CM | POA: Diagnosis not present

## 2014-05-28 DIAGNOSIS — N189 Chronic kidney disease, unspecified: Secondary | ICD-10-CM | POA: Insufficient documentation

## 2014-05-28 DIAGNOSIS — Z113 Encounter for screening for infections with a predominantly sexual mode of transmission: Secondary | ICD-10-CM | POA: Diagnosis not present

## 2014-05-28 DIAGNOSIS — Z7952 Long term (current) use of systemic steroids: Secondary | ICD-10-CM | POA: Insufficient documentation

## 2014-05-28 DIAGNOSIS — E119 Type 2 diabetes mellitus without complications: Secondary | ICD-10-CM | POA: Diagnosis not present

## 2014-05-28 DIAGNOSIS — Z720 Tobacco use: Secondary | ICD-10-CM | POA: Insufficient documentation

## 2014-05-28 DIAGNOSIS — Z8739 Personal history of other diseases of the musculoskeletal system and connective tissue: Secondary | ICD-10-CM | POA: Diagnosis not present

## 2014-05-28 DIAGNOSIS — Z8679 Personal history of other diseases of the circulatory system: Secondary | ICD-10-CM | POA: Diagnosis not present

## 2014-05-28 DIAGNOSIS — Z794 Long term (current) use of insulin: Secondary | ICD-10-CM | POA: Insufficient documentation

## 2014-05-28 DIAGNOSIS — Z79899 Other long term (current) drug therapy: Secondary | ICD-10-CM | POA: Diagnosis not present

## 2014-05-28 DIAGNOSIS — J449 Chronic obstructive pulmonary disease, unspecified: Secondary | ICD-10-CM | POA: Diagnosis not present

## 2014-05-28 DIAGNOSIS — Z202 Contact with and (suspected) exposure to infections with a predominantly sexual mode of transmission: Secondary | ICD-10-CM | POA: Diagnosis present

## 2014-05-28 NOTE — ED Provider Notes (Signed)
CSN: [id number]     Arrival date & time 05/28/14  9735 History  This chart was scribed for non-physician practitioner, Abigail Butts, PA-C,working with Debby Freiberg, MD, by Marlowe Kays, ED Scribe. This patient was seen in room TR11C/TR11C and the patient's care was started at 8:13 PM.  Chief Complaint  Patient presents with  . STD check    The history is provided by the patient. No language interpreter was used.    HPI Comments:  Adam Ray is a 58 y.o. male who presents to the Emergency Department wanting a blood test for HIV. His wife is present in the room and states that another male called the patient on her phone and said she had tested positive for HIV. Pt denies knowing the male but wants the test to appease his wife. He denies any symptoms. Denies penile discharge or pain, testicle pain or swelling, lesions, abdominal pain, night sweats, weight loss or rashes. He reports PMH of genital warts, DM, COPD, CKD and gout.  Past Medical History  Diagnosis Date  . Diabetes mellitus   . COPD (chronic obstructive pulmonary disease)   . Allergy   . Chronic kidney disease   . Bronchitis   . Gout   . Claudication     right foot ray resection   Past Surgical History  Procedure Laterality Date  . Surgery left great toe    . Tear ducts bilateral eyes    . I&d extremity  04/11/2012    Procedure: IRRIGATION AND DEBRIDEMENT EXTREMITY;  Surgeon: Wylene Simmer, MD;  Location: [place];  Service: Orthopedics;  Laterality: Right;   Family History  Problem Relation Age of Onset  . Diabetes Mother   . Stroke Mother   . Heart failure Father    History  Substance Use Topics  . Smoking status: Current Every Day Smoker -- 0.30 packs/day for 48 years    Types: Cigars    Start date: 07/02/1963  . Smokeless tobacco: Current User     Comment: wants to use electric cigarettes.  not interested in pills, worried about chantix side effects  . Alcohol Use: No    Review of Systems   Constitutional: Negative for fever, chills, diaphoresis, appetite change, fatigue and unexpected weight change.  HENT: Negative for mouth sores.   Eyes: Negative for visual disturbance.  Respiratory: Negative for cough, chest tightness, shortness of breath and wheezing.   Cardiovascular: Negative for chest pain.  Gastrointestinal: Negative for nausea, vomiting, abdominal pain, diarrhea and constipation.  Endocrine: Negative for polydipsia, polyphagia and polyuria.  Genitourinary: Negative for dysuria, urgency, frequency, hematuria, discharge, penile swelling, scrotal swelling, genital sores, penile pain and testicular pain.  Musculoskeletal: Negative for back pain and neck stiffness.  Skin: Negative for rash.  Allergic/Immunologic: Negative for immunocompromised state.  Neurological: Negative for syncope, light-headedness and headaches.  Hematological: Does not bruise/bleed easily.  Psychiatric/Behavioral: Negative for sleep disturbance. The patient is not nervous/anxious.     Allergies  Review of patient's allergies indicates no known allergies.  Home Medications   Prior to Admission medications   Medication Sig Start Date End Date Taking? Authorizing Provider  albuterol (PROVENTIL HFA;VENTOLIN HFA) 108 (90 BASE) MCG/ACT inhaler Inhale 2 puffs into the lungs every 6 (six) hours as needed for wheezing or shortness of breath. 04/29/14   Timmothy Euler, MD  aspirin EC 81 MG tablet Take 81 mg by mouth daily.    Historical Provider, MD  furosemide (LASIX) 20 MG tablet TAKE 1 TABLET BY  MOUTH EVERY DAY MAY TAKE UP TO 2 PER DAY    [name], MD  guaiFENesin 200 MG tablet Take 1 tablet (200 mg total) by mouth every 4 (four) hours as needed for cough or to loosen phlegm. 04/29/14   Timmothy Euler, MD  HYDROcodone-acetaminophen (NORCO) 5-325 MG per tablet Take 1 tablet by mouth every 4 (four) hours as needed for moderate pain. 01/19/14   Liam Graham, PA-C  hydrocortisone cream  0.5 % Apply 1 application topically 2 (two) times daily. 12/21/13   Lupita Dawn, MD  ibuprofen (ADVIL,MOTRIN) 200 MG tablet Take 400 mg by mouth every 6 (six) hours as needed for moderate pain.     Historical Provider, MD  insulin aspart (NOVOLOG) 100 UNIT/ML injection Inject 25 Units into the skin daily.     Historical Provider, MD  insulin glargine (LANTUS) 100 UNIT/ML injection Inject 1 mL (100 Units total) into the skin at bedtime.    [name], MD  metFORMIN (GLUCOPHAGE) 1000 MG tablet Take 1 tablet (1,000 mg total) by mouth 2 (two) times daily with a meal. 01/20/14   [name], MD  mupirocin ointment (BACTROBAN) 2 % Place 1 application into the nose 2 (two) times daily. 12/21/13   Lupita Dawn, MD  podofilox (CONDYLOX) 0.5 % gel Apply topically 2 (two) times daily. Treat for 3 days then stop for 4 days. Repeat cycle up to 4 times 05/23/14   Patrecia Pour, MD  quinapril (ACCUPRIL) 5 MG tablet Take 0.5 tablets (2.5 mg total) by mouth daily. 11/24/12   Carolin Guernsey, MD  sildenafil (VIAGRA) 100 MG tablet Take 100 mg by mouth daily as needed for erectile dysfunction.    Historical Provider, MD  traMADol (ULTRAM) 50 MG tablet Take 1 tablet (50 mg total) by mouth every 8 (eight) hours as needed. 12/21/13   Lupita Dawn, MD   Triage Vitals: BP 142/71 mmHg  Pulse 85  Temp(Src) 98.2 F (36.8 C) (Oral)  Resp 18  Ht 5\' 10"  (1.778 m)  Wt 163 lb (73.936 kg)  BMI 23.39 kg/m2  SpO2 97% Physical Exam  Constitutional: He appears well-developed and well-nourished. No distress.  Awake, alert, nontoxic appearance  HENT:  Head: Normocephalic and atraumatic.  Mouth/Throat: Oropharynx is clear and moist. No oropharyngeal exudate.  Eyes: Conjunctivae are normal. No scleral icterus.  Neck: Normal range of motion. Neck supple.  Cardiovascular: Normal rate, regular rhythm, normal heart sounds and intact distal pulses.   No murmur heard. Pulmonary/Chest: Effort normal and breath sounds  normal. No respiratory distress. He has no wheezes.  Equal chest expansion  Abdominal: Soft. Bowel sounds are normal. He exhibits no distension and no mass. There is no tenderness. There is no rebound and no guarding. Hernia confirmed negative in the right inguinal area and confirmed negative in the left inguinal area.  Genitourinary: Testes normal. Cremasteric reflex is present. Circumcised. No phimosis, paraphimosis, hypospadias, penile erythema or penile tenderness. Discharge (clear, thin) found.  Musculoskeletal: Normal range of motion. He exhibits no edema.  Lymphadenopathy:       Right: No inguinal adenopathy present.       Left: No inguinal adenopathy present.  Neurological: He is alert.  Speech is clear and goal oriented Moves extremities without ataxia  Skin: Skin is warm and dry. He is not diaphoretic.  Psychiatric: He has a normal mood and affect.  Nursing note and vitals reviewed.   ED Course  Procedures (including  critical care time) DIAGNOSTIC STUDIES: Oxygen Saturation is 97% on RA, normal by my interpretation.   COORDINATION OF CARE: 8:25 PM- Will test for GC/chlamydia and order lab work for further testing for STDs. Pt verbalizes understanding and agrees to plan.  Medications - No data to display  Labs Review Labs Reviewed  GC/CHLAMYDIA PROBE AMP  RPR  HIV ANTIBODY (ROUTINE TESTING)    Imaging Review No results found.   EKG Interpretation None      MDM   Final diagnoses:  Screen for STD (sexually transmitted disease)   Adam Ray presents with his wife who reports concerns about a possible STD.  Pt denies any somatic symptoms.  Presents for STD screen. STD cultures obtained including gonorrhea, HIV, RPR, and Chlamydia. Patient to be discharged with instructions to follow up with PCP. Discussed importance of using protection when sexually active. Pt understands that they have GC/Chlamydia cultures pending and that they will need to inform all sexual  partners if results return positive.   I have personally reviewed patient's vitals, nursing note and any pertinent labs or imaging.  I performed an focused physical exam; undressed when appropriate .    It has been determined that no acute conditions requiring further emergency intervention are present at this time. The patient/guardian have been advised of the diagnosis and plan. I reviewed any labs and imaging including any potential incidental findings. We have discussed signs and symptoms that warrant return to the ED and they are listed in the discharge instructions.    Vital signs are stable at discharge.   BP 136/75 mmHg  Pulse 75  Temp(Src) 98.2 F (36.8 C) (Oral)  Resp 20  Ht 5\' 10"  (1.778 m)  Wt 163 lb (73.936 kg)  BMI 23.39 kg/m2  SpO2 100%  I personally performed the services described in this documentation, which was scribed in my presence. The recorded information has been reviewed and is accurate.    Jarrett Soho Oris Staffieri, PA-C 05/28/14 2054  Debby Freiberg, MD 06/02/14 [phone]

## 2014-05-28 NOTE — ED Notes (Signed)
Pt in requesting to be tested for STDs, denies complaints.

## 2014-05-28 NOTE — ED Notes (Signed)
Patient presents requesting to be checked for STD's.  No complaints of painful urination, discharge, etc.

## 2014-05-28 NOTE — ED Notes (Signed)
Discharge instructions reviewed.  Voiced understanding but requests to have [place] call with his results

## 2014-05-28 NOTE — Discharge Instructions (Signed)
1. Medications: usual home medications 2. Treatment: rest, drink plenty of fluids, use condoms 3. Follow Up: Please followup with your primary doctor in 3 days for discussion of your diagnoses and further evaluation after today's visit; if you do not have a primary care doctor use the resource guide provided to find one; Please return to the ER for new or worsening symptoms    Health Maintenance A healthy lifestyle and preventative care can promote health and wellness.  Maintain regular health, dental, and eye exams.  Eat a healthy diet. Foods like vegetables, fruits, whole grains, low-fat dairy products, and lean protein foods contain the nutrients you need and are low in calories. Decrease your intake of foods high in solid fats, added sugars, and salt. Get information about a proper diet from your health care provider, if necessary.  Regular physical exercise is one of the most important things you can do for your health. Most adults should get at least 150 minutes of moderate-intensity exercise (any activity that increases your heart rate and causes you to sweat) each week. In addition, most adults need muscle-strengthening exercises on 2 or more days a week.   Maintain a healthy weight. The body mass index (BMI) is a screening tool to identify possible weight problems. It provides an estimate of body fat based on height and weight. Your health care provider can find your BMI and can help you achieve or maintain a healthy weight. For males 20 years and older:  A BMI below 18.5 is considered underweight.  A BMI of 18.5 to 24.9 is normal.  A BMI of 25 to 29.9 is considered overweight.  A BMI of 30 and above is considered obese.  Maintain normal blood lipids and cholesterol by exercising and minimizing your intake of saturated fat. Eat a balanced diet with plenty of fruits and vegetables. Blood tests for lipids and cholesterol should begin at age 65 and be repeated every 5 years. If your  lipid or cholesterol levels are high, you are over age 14, or you are at high risk for heart disease, you may need your cholesterol levels checked more frequently.Ongoing high lipid and cholesterol levels should be treated with medicines if diet and exercise are not working.  If you smoke, find out from your health care provider how to quit. If you do not use tobacco, do not start.  Lung cancer screening is recommended for adults aged 50-80 years who are at high risk for developing lung cancer because of a history of smoking. A yearly low-dose CT scan of the lungs is recommended for people who have at least a 30-pack-year history of smoking and are current smokers or have quit within the past 15 years. A pack year of smoking is smoking an average of 1 pack of cigarettes a day for 1 year (for example, a 30-pack-year history of smoking could mean smoking 1 pack a day for 30 years or 2 packs a day for 15 years). Yearly screening should continue until the smoker has stopped smoking for at least 15 years. Yearly screening should be stopped for people who develop a health problem that would prevent them from having lung cancer treatment.  If you choose to drink alcohol, do not have more than 2 drinks per day. One drink is considered to be 12 oz (360 mL) of beer, 5 oz (150 mL) of wine, or 1.5 oz (45 mL) of liquor.  Avoid the use of street drugs. Do not share needles with anyone. Ask  for help if you need support or instructions about stopping the use of drugs.  High blood pressure causes heart disease and increases the risk of stroke. Blood pressure should be checked at least every 1-2 years. Ongoing high blood pressure should be treated with medicines if weight loss and exercise are not effective.  If you are 97-52 years old, ask your health care provider if you should take aspirin to prevent heart disease.  Diabetes screening involves taking a blood sample to check your fasting blood sugar level. This  should be done once every 3 years after age 41 if you are at a normal weight and without risk factors for diabetes. Testing should be considered at a younger age or be carried out more frequently if you are overweight and have at least 1 risk factor for diabetes.  Colorectal cancer can be detected and often prevented. Most routine colorectal cancer screening begins at the age of 38 and continues through age 64. However, your health care provider may recommend screening at an earlier age if you have risk factors for colon cancer. On a yearly basis, your health care provider may provide home test kits to check for hidden blood in the stool. A small camera at the end of a tube may be used to directly examine the colon (sigmoidoscopy or colonoscopy) to detect the earliest forms of colorectal cancer. Talk to your health care provider about this at age 67 when routine screening begins. A direct exam of the colon should be repeated every 5-10 years through age 70, unless early forms of precancerous polyps or small growths are found.  People who are at an increased risk for hepatitis B should be screened for this virus. You are considered at high risk for hepatitis B if:  You were born in a country where hepatitis B occurs often. Talk with your health care provider about which countries are considered high risk.  Your parents were born in a high-risk country and you have not received a shot to protect against hepatitis B (hepatitis B vaccine).  You have HIV or AIDS.  You use needles to inject street drugs.  You live with, or have sex with, someone who has hepatitis B.  You are a man who has sex with other men (MSM).  You get hemodialysis treatment.  You take certain medicines for conditions like cancer, organ transplantation, and autoimmune conditions.  Hepatitis C blood testing is recommended for all people born from [birth year] through 1966 and any individual with known risk factors for hepatitis  C.  Healthy men should no longer receive prostate-specific antigen (PSA) blood tests as part of routine cancer screening. Talk to your health care provider about prostate cancer screening.  Testicular cancer screening is not recommended for adolescents or adult males who have no symptoms. Screening includes self-exam, a health care provider exam, and other screening tests. Consult with your health care provider about any symptoms you have or any concerns you have about testicular cancer.  Practice safe sex. Use condoms and avoid high-risk sexual practices to reduce the spread of sexually transmitted infections (STIs).  You should be screened for STIs, including gonorrhea and chlamydia if:  You are sexually active and are younger than 24 years.  You are older than 24 years, and your health care provider tells you that you are at risk for this type of infection.  Your sexual activity has changed since you were last screened, and you are at an increased risk for chlamydia or  gonorrhea. Ask your health care provider if you are at risk.  If you are at risk of being infected with HIV, it is recommended that you take a prescription medicine daily to prevent HIV infection. This is called pre-exposure prophylaxis (PrEP). You are considered at risk if:  You are a man who has sex with other men (MSM).  You are a heterosexual man who is sexually active with multiple partners.  You take drugs by injection.  You are sexually active with a partner who has HIV.  Talk with your health care provider about whether you are at high risk of being infected with HIV. If you choose to begin PrEP, you should first be tested for HIV. You should then be tested every 3 months for as long as you are taking PrEP.  Use sunscreen. Apply sunscreen liberally and repeatedly throughout the day. You should seek shade when your shadow is shorter than you. Protect yourself by wearing long sleeves, pants, a wide-brimmed hat, and  sunglasses year round whenever you are outdoors.  Tell your health care provider of new moles or changes in moles, especially if there is a change in shape or color. Also, tell your health care provider if a mole is larger than the size of a pencil eraser.  A one-time screening for abdominal aortic aneurysm (AAA) and surgical repair of large AAAs by ultrasound is recommended for men aged 62-75 years who are current or former smokers.  Stay current with your vaccines (immunizations). Document Released: 12/14/2007 Document Revised: 06/22/2013 Document Reviewed: 11/12/2010 [facility] Patient Information 2015 [place], [place]. This information is not intended to replace advice given to you by your health care provider. Make sure you discuss any questions you have with your health care provider.  Sexually Transmitted Disease A sexually transmitted disease (STD) is a disease or infection that may be passed (transmitted) from person to person, usually during sexual activity. This may happen by way of saliva, semen, blood, vaginal mucus, or urine. Common STDs include:   Gonorrhea.   Chlamydia.   Syphilis.   HIV and AIDS.   Genital herpes.   Hepatitis B and C.   Trichomonas.   Human papillomavirus (HPV).   Pubic lice.   Scabies.  Mites.  Bacterial vaginosis. WHAT ARE CAUSES OF STDs? An STD may be caused by bacteria, a virus, or parasites. STDs are often transmitted during sexual activity if one person is infected. However, they may also be transmitted through nonsexual means. STDs may be transmitted after:   Sexual intercourse with an infected person.   Sharing sex toys with an infected person.   Sharing needles with an infected person or using unclean piercing or tattoo needles.  Having intimate contact with the genitals, mouth, or rectal areas of an infected person.   Exposure to infected fluids during birth. WHAT ARE THE SIGNS AND SYMPTOMS OF STDs? Different STDs  have different symptoms. Some people may not have any symptoms. If symptoms are present, they may include:   Painful or bloody urination.   Pain in the pelvis, abdomen, vagina, anus, throat, or eyes.   A skin rash, itching, or irritation.  Growths, ulcerations, blisters, or sores in the genital and anal areas.  Abnormal vaginal discharge with or without bad odor.   Penile discharge in men.   Fever.   Pain or bleeding during sexual intercourse.   Swollen glands in the groin area.   Yellow skin and eyes (jaundice). This is seen with hepatitis.   Swollen  testicles.  Infertility.  Sores and blisters in the mouth. HOW ARE STDs DIAGNOSED? To make a diagnosis, your health care provider may:   Take a medical history.   Perform a physical exam.   Take a sample of any discharge to examine.  Swab the throat, cervix, opening to the penis, rectum, or vagina for testing.  Test a sample of your first morning urine.   Perform blood tests.   Perform a Pap test, if this applies.   Perform a colposcopy.   Perform a laparoscopy.  HOW ARE STDs TREATED? Treatment depends on the STD. Some STDs may be treated but not cured.   Chlamydia, gonorrhea, trichomonas, and syphilis can be cured with antibiotic medicine.   Genital herpes, hepatitis, and HIV can be treated, but not cured, with prescribed medicines. The medicines lessen symptoms.   Genital warts from HPV can be treated with medicine or by freezing, burning (electrocautery), or surgery. Warts may come back.   HPV cannot be cured with medicine or surgery. However, abnormal areas may be removed from the cervix, vagina, or vulva.   If your diagnosis is confirmed, your recent sexual partners need treatment. This is true even if they are symptom-free or have a negative culture or evaluation. They should not have sex until their health care providers say it is okay. HOW CAN I REDUCE MY RISK OF GETTING AN  STD? Take these steps to reduce your risk of getting an STD:  Use latex condoms, dental dams, and water-soluble lubricants during sexual activity. Do not use petroleum jelly or oils.  Avoid having multiple sex partners.  Do not have sex with someone who has other sex partners.  Do not have sex with anyone you do not know or who is at high risk for an STD.  Avoid risky sex practices that can break your skin.  Do not have sex if you have open sores on your mouth or skin.  Avoid drinking too much alcohol or taking illegal drugs. Alcohol and drugs can affect your judgment and put you in a vulnerable position.  Avoid engaging in oral and anal sex acts.  Get vaccinated for HPV and hepatitis. If you have not received these vaccines in the past, talk to your health care provider about whether one or both might be right for you.   If you are at risk of being infected with HIV, it is recommended that you take a prescription medicine daily to prevent HIV infection. This is called pre-exposure prophylaxis (PrEP). You are considered at risk if:  You are a man who has sex with other men (MSM).  You are a heterosexual man or woman and are sexually active with more than one partner.  You take drugs by injection.  You are sexually active with a partner who has HIV.  Talk with your health care provider about whether you are at high risk of being infected with HIV. If you choose to begin PrEP, you should first be tested for HIV. You should then be tested every 3 months for as long as you are taking PrEP.  WHAT SHOULD I DO IF I THINK I HAVE AN STD?  See your health care provider.   Tell your sexual partner(s). They should be tested and treated for any STDs.  Do not have sex until your health care provider says it is okay. WHEN SHOULD I GET IMMEDIATE MEDICAL CARE? Contact your health care provider right away if:   You have severe abdominal pain.  You are a man and notice swelling or pain  in your testicles.  You are a woman and notice swelling or pain in your vagina. Document Released: 09/07/2002 Document Revised: 06/22/2013 Document Reviewed: 01/05/2013 [facility] Patient Information 2015 [place], [place]. This information is not intended to replace advice given to you by your health care provider. Make sure you discuss any questions you have with your health care provider.

## 2014-05-29 LAB — HIV ANTIBODY (ROUTINE TESTING W REFLEX): HIV 1&2 Ab, 4th Generation: NONREACTIVE

## 2014-05-29 LAB — RPR

## 2014-05-31 ENCOUNTER — Other Ambulatory Visit: Payer: Self-pay | Admitting: Family Medicine

## 2014-05-31 LAB — GC/CHLAMYDIA PROBE AMP
CT Probe RNA: NEGATIVE
GC Probe RNA: NEGATIVE

## 2014-05-31 NOTE — Telephone Encounter (Signed)
Pt was given podoliox on his last visit. He lost the prescription and needs another one Please call when it is ready for pickup

## 2014-05-31 NOTE — Telephone Encounter (Signed)
Will forward to MD who prescribed.

## 2014-06-16 MED ORDER — PODOFILOX 0.5 % EX GEL: Freq: Two times a day (BID) | CUTANEOUS | Status: DC

## 2014-06-16 NOTE — Telephone Encounter (Signed)
LVM for patient to call back to inform of info below.

## 2014-06-16 NOTE — Telephone Encounter (Signed)
Refill printed and placed at front desk.

## 2014-07-04 ENCOUNTER — Ambulatory Visit: Payer: Medicare HMO | Admitting: Family Medicine

## 2014-07-04 ENCOUNTER — Encounter: Payer: Self-pay | Admitting: Family Medicine

## 2014-07-04 ENCOUNTER — Ambulatory Visit (INDEPENDENT_AMBULATORY_CARE_PROVIDER_SITE_OTHER): Payer: Medicare HMO | Admitting: Family Medicine

## 2014-07-04 VITALS — BP 142/76 | HR 71 | Temp 97.7°F | Ht 70.0 in | Wt 168.0 lb

## 2014-07-04 DIAGNOSIS — A630 Anogenital (venereal) warts: Secondary | ICD-10-CM

## 2014-07-04 MED ORDER — PODOFILOX 0.5 % EX GEL: Freq: Two times a day (BID) | CUTANEOUS | Status: DC

## 2014-07-04 NOTE — Progress Notes (Signed)
  Subjective:     Adam Ray is a 59 y.o. male who comes in for re-revaluation of condyloma accuminatum.  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  Have improved since starting Podofolix   Objective:    Skin: No evidence of firm pink/purple papules on the scrotum, perineal area, or penile shaft/meatus   Assessment:    Hx of Warts (Verruca Vulgaris)    Plan:   1. Refill on Podofilox today.  Apply PRN and f/u PRN

## 2014-07-04 NOTE — Patient Instructions (Signed)
Podofilox topical gel What is this medicine? PODOFILOX (po do FIL ox) is used to remove genital or perianal warts. This medicine may be used for other purposes; ask your health care provider or pharmacist if you have questions. COMMON BRAND NAME(S): Condylox What should I tell my health care provider before I take this medicine? They need to know if you have any of these conditions: -an unusual or allergic reaction to podofilox, podophyllum resin, other medicines, foods, dyes or preservatives -pregnant or trying to get pregnant -breast-feeding How should I use this medicine? This medicine is for external use only. Do not take by mouth. This medicine may be used to remove warts on areas of the skin around the vagina or penis and between the rectum and the genitals. It should not be used to treat warts that are inside the rectum, vagina, or penis. Follow the directions on the prescription label. Wash hands before and after use. Apply the gel to the specific wart as instructed by your doctor or health care professional. Use the applicator provided or a cotton-tipped applicator. Applicators should not be re-used. Make sure that the gel is dry before normal, untreated skin comes into contact with the treated skin. This medicine can cause severe irritation of normal skin. If contact with normal skin occurs, immediately flush the area thoroughly with water. Avoid contact with the eyes. If eye contact occurs, immediately flush the eye with large quantities of water for 15 minutes and seek medical attention. Do not use this medicine more often or for longer than directed. Talk to your pediatrician regarding the use of this medicine in children. Special care may be needed. Overdosage: If you think you have taken too much of this medicine contact a poison control center or emergency room at once. NOTE: This medicine is only for you. Do not share this medicine with others. What if I miss a dose? If you miss a  dose, use it as soon as you can. If it is almost time for your next dose, use only that dose. Do not use double or extra doses. What may interact with this medicine? Interactions are not expected. Do not use any other skin products on the same area of skin without asking your doctor or health care professional. This list may not describe all possible interactions. Give your health care provider a list of all the medicines, herbs, non-prescription drugs, or dietary supplements you use. Also tell them if you smoke, drink alcohol, or use illegal drugs. Some items may interact with your medicine. What should I watch for while using this medicine? Visit your doctor or health care professional for regular checks on your progress. This medicine is not a cure. New warts may develop during or after treatment. Tell your doctor or health care professional if your symptoms do not start to get better within one week. The weekly treatment course can be repeated up to 4 times. If the wart does not go away in 4 weeks, a different treatment should be considered. If you are pregnant or think you might be pregnant, contact your doctor or health care professional. Sexual (genital, oral, anal) contact should be avoided while the medication is on the skin. The only way to prevent infecting others with the HPV virus (the virus that causes genital warts) is to avoid direct skin-to-skin contact. If warts are visible in the genital area, sexual contact should be avoided until the warts are treated. Experts advise that using latex condoms during sexual contact  may reduce, but not entirely prevent, infecting others. What side effects may I notice from receiving this medicine? Side effects that you should report to your doctor or health care professional as soon as possible: -allergic reactions like skin rash, itching or hives, swelling of the face, lips, or tongue -bleeding, blistering, burning, crusting, or scabbing of treated  skin -blood in the urine -dizziness -vomiting (may indicate excessive dosage) Side effects that usually do not require medical attention (report to your doctor or health care professional if they continue or are bothersome): -dryness, flaking or peeling of the skin -headache -mild redness, itching or stinging of the skin This list may not describe all possible side effects. Call your doctor for medical advice about side effects. You may report side effects to FDA at 1-800-FDA-1088. Where should I keep my medicine? Keep out of the reach of children. Store at room temperature between 15 and 30 degrees C (59 and 86 degrees F). Do not freeze. Keep container tightly closed. This medicine contains alcohol and is flammable. Do not store near heat or open flame. Throw away any unused medicine after the expiration date. NOTE: This sheet is a summary. It may not cover all possible information. If you have questions about this medicine, talk to your doctor, pharmacist, or health care provider.  2015, [employer]vier/[employer] Standard. (2008-01-18 16:12:59)

## 2014-07-19 ENCOUNTER — Other Ambulatory Visit (HOSPITAL_COMMUNITY)
Admission: RE | Admit: 2014-07-19 | Discharge: 2014-07-19 | Disposition: A | Discharge status: Home / Self Care | Payer: Medicare HMO | Source: Ambulatory Visit | Attending: Family Medicine | Admitting: Family Medicine

## 2014-07-19 ENCOUNTER — Ambulatory Visit (INDEPENDENT_AMBULATORY_CARE_PROVIDER_SITE_OTHER): Payer: Medicare HMO | Admitting: Family Medicine

## 2014-07-19 ENCOUNTER — Encounter: Payer: Self-pay | Admitting: Family Medicine

## 2014-07-19 VITALS — BP 113/66 | HR 87 | Temp 98.1°F | Ht 70.0 in | Wt 172.0 lb

## 2014-07-19 DIAGNOSIS — Z202 Contact with and (suspected) exposure to infections with a predominantly sexual mode of transmission: Secondary | ICD-10-CM

## 2014-07-19 DIAGNOSIS — Z113 Encounter for screening for infections with a predominantly sexual mode of transmission: Secondary | ICD-10-CM | POA: Diagnosis present

## 2014-07-19 DIAGNOSIS — E11319 Type 2 diabetes mellitus with unspecified diabetic retinopathy without macular edema: Secondary | ICD-10-CM

## 2014-07-19 LAB — POCT GLYCOSYLATED HEMOGLOBIN (HGB A1C): Hemoglobin A1C: 11.8

## 2014-07-19 MED ORDER — PODOFILOX 0.5 % EX GEL: Freq: Two times a day (BID) | CUTANEOUS | Status: DC

## 2014-07-19 NOTE — Progress Notes (Signed)
Subjective: Adam Ray is a 59 y.o. male presenting for STD check.   Desires STD check. Has had sexual contact with another woman outside of 5 year marriage.  Wife would like confirmation that he is clear of and diseases. Oral and penile sites exposed only. Has history of genital warts but no lesions since most recent treatment. Requests cream for any future flares.   - Review of Systems: Per HPI.  - Smoking status noted Objective: BP 113/66 mmHg  Pulse 87  Temp(Src) 98.1 F (36.7 C) (Oral)  Ht 5\' 10"  (1.778 m)  Wt 172 lb (78.019 kg)  BMI 24.68 kg/m2 Gen: 59 y.o. male in no distress HEENT: Mild R cataract. + arcus senilis, muddy sclerae without jaundice or inflammation GU: no penile lesions or discharge, no testicular masses or tenderness, no hernias. No lesions on scrotum.  Skin: normal coloration, no other rashes, no suspicious skin lesions noted  Assessment/Plan: Adam Ray is a 59 y.o. male here for STD check.   1. Exposure to STD - Refill podofilox - RPR - HIV antibody - POCT glycosylated hemoglobin (Hb A1C) - Urine cytology ancillary only - Throat culture

## 2014-07-19 NOTE — Patient Instructions (Signed)
We will call with results.  You have no active genital warts whatsoever.

## 2014-07-20 LAB — URINE CYTOLOGY ANCILLARY ONLY
Chlamydia: NEGATIVE
Neisseria Gonorrhea: NEGATIVE

## 2014-07-20 LAB — RPR

## 2014-07-20 LAB — HIV ANTIBODY (ROUTINE TESTING W REFLEX): HIV 1&2 Ab, 4th Generation: NONREACTIVE

## 2014-07-22 LAB — GONOCOCCUS CULTURE: Organism ID, Bacteria: NO GROWTH

## 2014-07-26 LAB — CHLAMYDIA CULTURE

## 2014-08-04 ENCOUNTER — Ambulatory Visit (INDEPENDENT_AMBULATORY_CARE_PROVIDER_SITE_OTHER): Payer: Medicare HMO | Admitting: Family Medicine

## 2014-08-04 ENCOUNTER — Encounter: Payer: Self-pay | Admitting: Family Medicine

## 2014-08-04 VITALS — BP 130/69 | HR 77 | Temp 98.4°F | Ht 70.0 in | Wt 169.7 lb

## 2014-08-04 DIAGNOSIS — A630 Anogenital (venereal) warts: Secondary | ICD-10-CM

## 2014-08-04 MED ORDER — VALACYCLOVIR HCL 1 G PO TABS: 1000.0000 mg | ORAL_TABLET | Freq: Two times a day (BID) | ORAL | Status: DC

## 2014-08-04 MED ORDER — "INSULIN SYRINGE-NEEDLE U-100 26G X 1/2"" 1 ML MISC": 1.0000 | Freq: Two times a day (BID) | Status: DC

## 2014-08-05 ENCOUNTER — Telehealth: Payer: Self-pay | Admitting: *Deleted

## 2014-08-05 NOTE — Patient Instructions (Signed)
I am snot sure what you need, but here's some information on genital warts.  Again, I don't know of any medication you only take every 4 days or any medication you can take to suppress genital warts once you've already got them. Genital Warts Genital warts are a sexually transmitted infection. They may appear as small bumps on the tissues of the genital area. CAUSES  Genital warts are caused by a virus called human papillomavirus (HPV). HPV is the most common sexually transmitted disease (STD) and infection of the sex organs. This infection is spread by having unprotected sex with an infected person. It can be spread by vaginal, anal, and oral sex. Many people do not know they are infected. They may be infected for years without problems. However, even if they do not have problems, they can unknowingly pass the infection to their sexual partners. SYMPTOMS   Itching and irritation in the genital area.  Warts that bleed.  Painful sexual intercourse. DIAGNOSIS  Warts are usually recognized with the naked eye on the vagina, vulva, perineum, anus, and rectum. Certain tests can also diagnose genital warts, such as:  A Pap test.  A tissue sample (biopsy) exam.  Colposcopy. A magnifying tool is used to examine the vagina and cervix. The HPV cells will change color when certain solutions are used. TREATMENT  Warts can be removed by:  Applying certain chemicals, such as cantharidin or podophyllin.  Liquid nitrogen freezing (cryotherapy).  Immunotherapy with Candida or Trichophyton injections.  Laser treatment.  Burning with an electrified probe (electrocautery).  Interferon injections.  Surgery. PREVENTION  HPV vaccination can help prevent HPV infections that cause genital warts and that cause cancer of the cervix. It is recommended that the vaccination be given to people between the ages 32 to 67 years old. The vaccine might not work as well or might not work at all if you already have  HPV. It should not be given to pregnant women. HOME CARE INSTRUCTIONS   It is important to follow your caregiver's instructions. The warts will not go away without treatment. Repeat treatments are often needed to get rid of warts. Even after it appears that the warts are gone, the normal tissue underneath often remains infected.  Do not try to treat genital warts with medicine used to treat hand warts. This type of medicine is strong and can burn the skin in the genital area, causing more damage.  Tell your past and current sexual partner(s) that you have genital warts. They may be infected also and need treatment.  Avoid sexual contact while being treated.  Do not touch or scratch the warts. The infection may spread to other parts of your body.  Women with genital warts should have a cervical cancer check (Pap test) at least once a year. This type of cancer is slow-growing and can be cured if found early. Chances of developing cervical cancer are increased with HPV.  Inform your obstetrician about your warts in the event of pregnancy. This virus can be passed to the baby's respiratory tract. Discuss this with your caregiver.  Use a condom during sexual intercourse. Following treatment, the use of condoms will help prevent reinfection.  Ask your caregiver about using over-the-counter anti-itch creams. SEEK MEDICAL CARE IF:   Your treated skin becomes red, swollen, or painful.  You have a fever.  You feel generally ill.  You feel little lumps in and around your genital area.  You are bleeding or have painful sexual intercourse. MAKE  SURE YOU:   Understand these instructions.  Will watch your condition.  Will get help right away if you are not doing well or get worse. Document Released: 06/14/2000 Document Revised: 11/01/2013 Document Reviewed: 12/24/2010 [facility] Patient Information 2015 [place], [place]. This information is not intended to replace advice given to you by your  health care provider. Make sure you discuss any questions you have with your health care provider.

## 2014-08-05 NOTE — Progress Notes (Signed)
Patient ID: Adam Ray, male   DOB: 06/26/1956, 59 y.o.   MRN: [id number] Subjective: Adam Ray is a 59 y.o. male presenting requesting a medication.  Adam Ray requests a pill that he has been prescribed in the past (doesn't remember what year or by whom) which he took every 4 days to clear up genital warts. He did not bring a pill or bottle with him as example. He denies recent genital warts, but knows they will come back and wants them suppressed.   Objective: BP 130/69 mmHg  Pulse 77  Temp(Src) 98.4 F (36.9 C) (Oral)  Ht 5\' 10"  (1.778 m)  Wt 169 lb 11.2 oz (76.975 kg)  BMI 24.35 kg/m2 Gen:  59 y.o. male in no distress Psych: Fairly groomed and appropriately dressed. Speech is normal volume and rate. Mood and affect are euthymic and congruent. Thoughts are consistently misadvised but process is logical and goal directed. No suicidal or homicidal ideation. Does not appear to be responding to any internal stimuli. Able to maintain train of thought and concentrate on the questions. Cognitive ability is below average.    Assessment/Plan: Adam Ray is a 59 y.o. male here for medication request. - Pt is to bring this medication by tomorrow, though I have set the expectation that I'm not aware of any medication that can be taken every 4 days or that suppresses genital warts.

## 2014-08-05 NOTE — Telephone Encounter (Signed)
Pt brought in empty bottle of Cephalexin 500 mg requesting a refill.  Medication was prescribed by Deirdre Pippins.  Medication last filled 04/29/14.  Left pt a voice message informing him, an appt will need to be made first to determine if this medication is needed.  Derl Barrow, RN

## 2014-08-06 ENCOUNTER — Other Ambulatory Visit: Payer: Self-pay | Admitting: Family Medicine

## 2014-08-26 ENCOUNTER — Ambulatory Visit: Payer: Medicare HMO | Admitting: Family Medicine

## 2014-09-08 ENCOUNTER — Ambulatory Visit (INDEPENDENT_AMBULATORY_CARE_PROVIDER_SITE_OTHER): Payer: Medicare HMO | Admitting: Family Medicine

## 2014-09-08 ENCOUNTER — Encounter: Payer: Self-pay | Admitting: Family Medicine

## 2014-09-08 VITALS — BP 121/84 | HR 75 | Ht 70.0 in | Wt 166.0 lb

## 2014-09-08 DIAGNOSIS — IMO0002 Reserved for concepts with insufficient information to code with codable children: Secondary | ICD-10-CM

## 2014-09-08 DIAGNOSIS — E1165 Type 2 diabetes mellitus with hyperglycemia: Secondary | ICD-10-CM

## 2014-09-08 LAB — LIPID PANEL
Cholesterol: 156 mg/dL (ref 0–200)
HDL: 35 mg/dL — ABNORMAL LOW (ref >=40)
LDL CALC: 63 mg/dL (ref 0–99)
TRIGLYCERIDES: 289 mg/dL — AB (ref <150)
Total CHOL/HDL Ratio: 4.5 Ratio
VLDL: 58 mg/dL — ABNORMAL HIGH (ref 0–40)

## 2014-09-08 LAB — COMPREHENSIVE METABOLIC PANEL
ALBUMIN: 4.2 g/dL (ref 3.5–5.2)
ALT: 24 U/L (ref 0–53)
AST: 17 U/L (ref 0–37)
Alkaline Phosphatase: 103 U/L (ref 39–117)
BILIRUBIN TOTAL: 0.5 mg/dL (ref 0.2–1.2)
BUN: 12 mg/dL (ref 6–23)
CHLORIDE: 98 meq/L (ref 96–112)
CO2: 25 meq/L (ref 19–32)
Calcium: 8.8 mg/dL (ref 8.4–10.5)
Creat: 1.22 mg/dL (ref 0.50–1.35)
GLUCOSE: 372 mg/dL — AB (ref 70–99)
Potassium: 4.5 mEq/L (ref 3.5–5.3)
Sodium: 136 mEq/L (ref 135–145)
TOTAL PROTEIN: 6.4 g/dL (ref 6.0–8.3)

## 2014-09-08 LAB — GLUCOSE, CAPILLARY: Glucose-Capillary: 355 mg/dL — ABNORMAL HIGH (ref 70–99)

## 2014-09-08 MED ORDER — INSULIN DETEMIR 100 UNIT/ML ~~LOC~~ SOLN: 40.0000 [IU] | Freq: Two times a day (BID) | SUBCUTANEOUS | Status: DC

## 2014-09-08 NOTE — Patient Instructions (Signed)
Start levemir 40 units twice a day I sent this in to your pharmacy  Check your sugar 2x a day (once when you wake up, and once in the afternoon) Fill out the blood sugar log  Follow up with me in 2-3 weeks to talk about how your sugars are doing.  Be well, Dr. [name]

## 2014-09-09 LAB — MICROALBUMIN / CREATININE URINE RATIO
Creatinine, Urine: 84.5 mg/dL
MICROALB/CREAT RATIO: 5.9 mg/g (ref 0.0–30.0)
Microalb, Ur: 0.5 mg/dL (ref <2.0)

## 2014-09-14 NOTE — Assessment & Plan Note (Signed)
A1c 11.8 in January. Obviously poorly controlled. CBG in clinic today is 355. Will send in new rx for levemir due to insurance requirements. Start with 40units BID (note decrease from prior dose of 100 of lantus daily). Printed and gave copy of CBG log for patient to complete. F/u with me in 2-3 weeks to discuss glycemic control and titrate insulin. I have asked him to STOP the novolog in the mornings, just intil we get an idea of what his sugars do on levemir.  Cardiac: check lipids & CMET, address statin at next visit Renal: check urine microalbumin today, NOT on ACE presently Eye: visit with Dr. Katy Fitch 1 mo ago Foot: defer to future visit Immunizations: UTD

## 2014-09-14 NOTE — Progress Notes (Signed)
Patient ID: Adam Ray, male   DOB: Jun 09, 1956, 59 y.o.   MRN: [id number] Visit date 09/08/14  HPI:  Pt presents to f/u on diabetes. His insurance recently stopped covering his Lantus, instead prefers levemir. He has thus not used lantus in 1 week. He was previously on 100 units daily. He also takes 25 units of novolog each morning. Sugars have been elevated. In the morning they are reportedly as low as 60-80 but patient reports he is asymptomatic when they are this low. In the evenings, they have been as high as 500's. He denies CP or SOB. He has not been taking quinapril for renal protection, has not had this prescribed in several years. Last eye doctor appt was with Dr. Katy Fitch last month.  ROS: See HPI.  [place]: hx T2DM, HTN, bicuspid aortic valve, diabetic retinopathy  PHYSICAL EXAM: BP 121/84 mmHg  Pulse 75  Ht 5\' 10"  (1.778 m)  Wt 166 lb (75.297 kg)  BMI 23.82 kg/m2 Gen: NAD HEENT: NCAT Heart: RRR no murmurs Lungs: CTAB NWOB Neuro: grossly nonfocal speech normal Ext: mild chronic bilat lower ext edema  ASSESSMENT/PLAN:  DM (diabetes mellitus), type 2, uncontrolled A1c 11.8 in January. Obviously poorly controlled. CBG in clinic today is 355. Will send in new rx for levemir due to insurance requirements. Start with 40units BID (note decrease from prior dose of 100 of lantus daily). Printed and gave copy of CBG log for patient to complete. F/u with me in 2-3 weeks to discuss glycemic control and titrate insulin. I have asked him to STOP the novolog in the mornings, just intil we get an idea of what his sugars do on levemir.  Cardiac: check lipids & CMET, address statin at next visit Renal: check urine microalbumin today, NOT on ACE presently Eye: visit with Dr. Katy Fitch 1 mo ago Foot: defer to future visit Immunizations: UTD     FOLLOW UP: F/u in 2-3 weeks for DM  [name], [place]

## 2014-09-22 ENCOUNTER — Telehealth: Payer: Self-pay | Admitting: Family Medicine

## 2014-09-22 ENCOUNTER — Ambulatory Visit (INDEPENDENT_AMBULATORY_CARE_PROVIDER_SITE_OTHER): Payer: Medicare HMO | Admitting: Family Medicine

## 2014-09-22 ENCOUNTER — Encounter: Payer: Self-pay | Admitting: Family Medicine

## 2014-09-22 VITALS — BP 123/78 | HR 68 | Temp 97.7°F | Ht 70.0 in | Wt 170.0 lb

## 2014-09-22 DIAGNOSIS — E1165 Type 2 diabetes mellitus with hyperglycemia: Secondary | ICD-10-CM

## 2014-09-22 DIAGNOSIS — IMO0002 Reserved for concepts with insufficient information to code with codable children: Secondary | ICD-10-CM

## 2014-09-22 MED ORDER — INSULIN ASPART 100 UNIT/ML ~~LOC~~ SOLN: SUBCUTANEOUS | Status: DC

## 2014-09-22 MED ORDER — INSULIN DETEMIR 100 UNIT/ML ~~LOC~~ SOLN: 50.0000 [IU] | Freq: Two times a day (BID) | SUBCUTANEOUS | Status: DC

## 2014-09-22 MED ORDER — ROSUVASTATIN CALCIUM 20 MG PO TABS: 20.0000 mg | ORAL_TABLET | Freq: Every day | ORAL | Status: DC

## 2014-09-22 NOTE — Telephone Encounter (Signed)
[place] [facility],  Please inform patient that his cholesterol is high. I recommend he take Crestor 20 mg daily. I sent in a prescription for this.  [name], MD

## 2014-09-22 NOTE — Patient Instructions (Signed)
Increase insulin to 50 units twice a day of levemir Okay to do the novolog if your sugars run over 300, with food only once per day Fill out sugar chart Follow up with me in 2 weeks to adjust insulin further. Call with any problems.  Be well, Dr. [name]

## 2014-09-22 NOTE — Progress Notes (Signed)
Patient ID: Adam Ray, male   DOB: Nov 24, 1955, 59 y.o.   MRN: [id number]  HPI:  Diabetes f/u: currently taking levemir 40 units BID. Tolerating this well. Checks sugars several x per day and is getting 300-400 on average. No #'s lower than 150, very few in 200's. Brought in CBG chart with him. He also has been taking novolog 25 units with one meal per day when his sugar is high.   ROS: See HPI.  [place]: t2dm, htn, sickle cell trait, copd  PHYSICAL EXAM: BP 123/78 mmHg  Pulse 68  Temp(Src) 97.7 F (36.5 C) (Oral)  Ht 5\' 10"  (1.778 m)  Wt 170 lb (77.111 kg)  BMI 24.39 kg/m2 Gen: NAD HEENT: NCAT Heart: RRR Lungs: CTAB NWOB Neuro: grossly nonfocal speech normal Ext: atraumatic  ASSESSMENT/PLAN:  DM (diabetes mellitus), type 2, uncontrolled CBG's poorly controlled. Increase levemir to 50 units BID.  Continue novolog 25 units with largest meal of day if sugar high - cautioned pt about risks of hypoglycemia Printed new CBG chart for patient F/u in around 2 weeks    FOLLOW UP: F/u in 2 weeks for DM  [name], [place]

## 2014-09-24 NOTE — Assessment & Plan Note (Signed)
CBG's poorly controlled. Increase levemir to 50 units BID.  Continue novolog 25 units with largest meal of day if sugar high - cautioned pt about risks of hypoglycemia Printed new CBG chart for patient F/u in around 2 weeks

## 2014-09-26 NOTE — Telephone Encounter (Signed)
Left message on voicemail for patient to call back. 

## 2014-09-27 NOTE — Telephone Encounter (Signed)
Left message on voicemail for patient to call back. 

## 2014-09-28 NOTE — Telephone Encounter (Signed)
Patient informed, expressed understanding. 

## 2014-10-06 ENCOUNTER — Ambulatory Visit (INDEPENDENT_AMBULATORY_CARE_PROVIDER_SITE_OTHER): Payer: Medicare HMO | Admitting: Family Medicine

## 2014-10-06 VITALS — BP 122/82 | HR 64 | Temp 97.8°F | Ht 70.0 in | Wt 167.5 lb

## 2014-10-06 DIAGNOSIS — E1165 Type 2 diabetes mellitus with hyperglycemia: Secondary | ICD-10-CM | POA: Diagnosis not present

## 2014-10-06 DIAGNOSIS — IMO0002 Reserved for concepts with insufficient information to code with codable children: Secondary | ICD-10-CM

## 2014-10-06 NOTE — Assessment & Plan Note (Signed)
Continues to have poorly controlled sugars Will increase levemir to 55 units BID. Also gave instructions for how to self-titrate levemir based on fasting sugars Will also start novolog sliding scale with meals (eats about 2 meals per day) F/u in 3 weeks to see how sugars are doing. Printed new CBG chart for him.

## 2014-10-06 NOTE — Patient Instructions (Signed)
Adjusting Levemir: Check your blood sugar each morning before you have anything to eat. If your blood sugar in the morning is over 120, increase the dose of your Levemir by 1 unit in the morning and one unit in the evening. Repeat the next day. Start with 55 units twice a day.  Adjusting novolog: Check your blood sugar before each meal. Based on what your blood sugar is, do the following: Mealtime Blood Sugar & NOVOLOG Insulin dose: 60-110    no novolog 111-150  4 units novolog 151-200  8 units novolog 201-250  10 units novolog 251-300  12 units novolog 301-350  14 units novolog 350-400  16 units novolog >400       20 units novolog  Please do not increase more than what I've written above, as I am concerned that you could drop your blood sugar too low. Call if you have any questions.  Be well, Dr. [name]

## 2014-10-06 NOTE — Progress Notes (Signed)
Patient ID: Adam Ray, male   DOB: 1955-12-04, 59 y.o.   MRN: [id number]  HPI:  F/u diabetes: currently taking levemir 50 units BID, as well as novolog 25 units with largest meal of day. On some days does novolog twice. Checks sugars and has gotten always in the 200s and above. Distressed by how high his sugars are.   ROS: See HPI.  [place]: hx HTN, T2DM, diabetic retinopathy, sickle cell trait, current smoker  PHYSICAL EXAM: BP 122/82 mmHg  Pulse 64  Temp(Src) 97.8 F (36.6 C) (Oral)  Ht 5\' 10"  (1.778 m)  Wt 167 lb 8 oz (75.978 kg)  BMI 24.03 kg/m2 Gen: NAD, well appearing HEENT: NCAT Lungs: NWOB Neuro: grossly nonfocal speech normal  ASSESSMENT/PLAN:   DM (diabetes mellitus), type 2, uncontrolled Continues to have poorly controlled sugars Will increase levemir to 55 units BID. Also gave instructions for how to self-titrate levemir based on fasting sugars Will also start novolog sliding scale with meals (eats about 2 meals per day) F/u in 3 weeks to see how sugars are doing. Printed new CBG chart for him.    FOLLOW UP: F/u in 3 weeks for DM  [name], [place]

## 2014-10-17 ENCOUNTER — Ambulatory Visit (INDEPENDENT_AMBULATORY_CARE_PROVIDER_SITE_OTHER): Payer: Medicare HMO | Admitting: Ophthalmology

## 2014-10-25 ENCOUNTER — Ambulatory Visit: Payer: Medicare HMO | Admitting: Family Medicine

## 2014-11-08 ENCOUNTER — Emergency Department (HOSPITAL_COMMUNITY): Payer: Medicare HMO

## 2014-11-08 ENCOUNTER — Emergency Department (HOSPITAL_COMMUNITY)
Admission: EM | Admit: 2014-11-08 | Discharge: 2014-11-08 | Disposition: A | Discharge status: Home / Self Care | Payer: Medicare HMO | Attending: Emergency Medicine | Admitting: Emergency Medicine

## 2014-11-08 ENCOUNTER — Encounter (HOSPITAL_COMMUNITY): Payer: Self-pay | Admitting: Cardiology

## 2014-11-08 DIAGNOSIS — J449 Chronic obstructive pulmonary disease, unspecified: Secondary | ICD-10-CM | POA: Insufficient documentation

## 2014-11-08 DIAGNOSIS — Y9289 Other specified places as the place of occurrence of the external cause: Secondary | ICD-10-CM | POA: Insufficient documentation

## 2014-11-08 DIAGNOSIS — E119 Type 2 diabetes mellitus without complications: Secondary | ICD-10-CM | POA: Insufficient documentation

## 2014-11-08 DIAGNOSIS — X58XXXA Exposure to other specified factors, initial encounter: Secondary | ICD-10-CM | POA: Diagnosis not present

## 2014-11-08 DIAGNOSIS — M109 Gout, unspecified: Secondary | ICD-10-CM | POA: Diagnosis not present

## 2014-11-08 DIAGNOSIS — Y9389 Activity, other specified: Secondary | ICD-10-CM | POA: Insufficient documentation

## 2014-11-08 DIAGNOSIS — Z7982 Long term (current) use of aspirin: Secondary | ICD-10-CM | POA: Insufficient documentation

## 2014-11-08 DIAGNOSIS — Z79899 Other long term (current) drug therapy: Secondary | ICD-10-CM | POA: Diagnosis not present

## 2014-11-08 DIAGNOSIS — N189 Chronic kidney disease, unspecified: Secondary | ICD-10-CM | POA: Insufficient documentation

## 2014-11-08 DIAGNOSIS — S299XXA Unspecified injury of thorax, initial encounter: Secondary | ICD-10-CM | POA: Diagnosis present

## 2014-11-08 DIAGNOSIS — Z8679 Personal history of other diseases of the circulatory system: Secondary | ICD-10-CM | POA: Insufficient documentation

## 2014-11-08 DIAGNOSIS — S29011A Strain of muscle and tendon of front wall of thorax, initial encounter: Secondary | ICD-10-CM

## 2014-11-08 DIAGNOSIS — Z720 Tobacco use: Secondary | ICD-10-CM | POA: Insufficient documentation

## 2014-11-08 DIAGNOSIS — Z794 Long term (current) use of insulin: Secondary | ICD-10-CM | POA: Insufficient documentation

## 2014-11-08 DIAGNOSIS — Y998 Other external cause status: Secondary | ICD-10-CM | POA: Diagnosis not present

## 2014-11-08 LAB — BASIC METABOLIC PANEL
Anion gap: 9 (ref 5–15)
BUN: 16 mg/dL (ref 6–20)
CALCIUM: 9.1 mg/dL (ref 8.9–10.3)
CO2: 23 mmol/L (ref 22–32)
CREATININE: 1.21 mg/dL (ref 0.61–1.24)
Chloride: 103 mmol/L (ref 101–111)
GFR calc Af Amer: >60 mL/min (ref >60)
GFR calc non Af Amer: >60 mL/min (ref >60)
Glucose, Bld: 413 mg/dL — ABNORMAL HIGH (ref 70–99)
Potassium: 4.6 mmol/L (ref 3.5–5.1)
Sodium: 135 mmol/L (ref 135–145)

## 2014-11-08 LAB — CBC
HEMATOCRIT: 39.7 % (ref 39.0–52.0)
HEMOGLOBIN: 14 g/dL (ref 13.0–17.0)
MCH: 30.2 pg (ref 26.0–34.0)
MCHC: 35.3 g/dL (ref 30.0–36.0)
MCV: 85.7 fL (ref 78.0–100.0)
Platelets: 125 10*3/uL — ABNORMAL LOW (ref 150–400)
RBC: 4.63 MIL/uL (ref 4.22–5.81)
RDW: 13.1 % (ref 11.5–15.5)
WBC: 4.3 10*3/uL (ref 4.0–10.5)

## 2014-11-08 LAB — I-STAT TROPONIN, ED: TROPONIN I, POC: 0 ng/mL (ref 0.00–0.08)

## 2014-11-08 LAB — CBG MONITORING, ED: Glucose-Capillary: 341 mg/dL — ABNORMAL HIGH (ref 70–99)

## 2014-11-08 MED ORDER — IBUPROFEN 400 MG PO TABS
600.0000 mg | ORAL_TABLET | Freq: Once | ORAL | Status: AC
  Administered 2014-11-08: 600 mg via ORAL
  Filled 2014-11-08: qty 2

## 2014-11-08 MED ORDER — SODIUM CHLORIDE 0.9 % IV BOLUS (SEPSIS): 1000.0000 mL | Freq: Once | INTRAVENOUS | Status: DC

## 2014-11-08 MED ORDER — METHOCARBAMOL 750 MG PO TABS: 750.0000 mg | ORAL_TABLET | Freq: Four times a day (QID) | ORAL | Status: DC | PRN

## 2014-11-08 NOTE — ED Notes (Addendum)
Pt continues to call out of room.  This RN requesting pt please stay until PA able to speak with him.  This RN explained delay in radiology report and wishes to view results before pt leaves.    PA made aware
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
## 2014-11-08 NOTE — ED Notes (Signed)
Pt is stating he needs to go home to get ready for work.    Called xray to verify report status, radiology sts "there is not a report".

## 2014-11-08 NOTE — ED Notes (Signed)
Pt has a hx of 4 broken ribs on the left side from 2009.  Pt sts this feels the same as when he broke his ribs.  Pain is worse with movement, palpation, and deep breaths.

## 2014-11-08 NOTE — ED Notes (Signed)
Pt called out again.  This RN went in to check on him.  Pt sts "I call sham.  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  I know how this works.  It doesn't work like this".    PA at bedside at this time

## 2014-11-08 NOTE — ED Notes (Addendum)
Pt reports that he was cutting limbs yesterday and had a sharp pain in his ribs on the left side while reaching up to cut the limb. Pt reports pain with movement of the left arm. Pt denies any nausea, or SOb. Pain present at this time. Reports it as sharp stabbing pain.

## 2014-11-08 NOTE — ED Notes (Signed)
Spoke to Radiology.  Will locate images and send them across again to be read.

## 2014-11-08 NOTE — Discharge Instructions (Signed)
Read the information below.  Use the prescribed medication as directed.  Please discuss all new medications with your pharmacist.  You may return to the Emergency Department at any time for worsening condition or any new symptoms that concern you.  If you develop worsening chest pain, shortness of breath, fever, you pass out, or become weak or dizzy, return to the ER for a recheck.       Muscle Strain A muscle strain (pulled muscle) happens when a muscle is stretched beyond normal length. It happens when a sudden, violent force stretches your muscle too far. Usually, a few of the fibers in your muscle are torn. Muscle strain is common in athletes. Recovery usually takes 1-2 weeks. Complete healing takes 5-6 weeks.  HOME CARE   Follow the PRICE method of treatment to help your injury get better. Do this the first 2-3 days after the injury:  Protect. Protect the muscle to keep it from getting injured again.  Rest. Limit your activity and rest the injured body part.  Ice. Put ice in a plastic bag. Place a towel between your skin and the bag. Then, apply the ice and leave it on from 15-20 minutes each hour. After the third day, switch to moist heat packs.  Compression. Use a splint or elastic bandage on the injured area for comfort. Do not put it on too tightly.  Elevate. Keep the injured body part above the level of your heart.  Only take medicine as told by your doctor.  Warm up before doing exercise to prevent future muscle strains. GET HELP IF:   You have more pain or puffiness (swelling) in the injured area.  You feel numbness, tingling, or notice a loss of strength in the injured area. MAKE SURE YOU:   Understand these instructions.  Will watch your condition.  Will get help right away if you are not doing well or get worse. Document Released: 03/26/2008 Document Revised: 04/07/2013 Document Reviewed: 01/14/2013 [facility] Patient Information 2015 [place], [place]. This information  is not intended to replace advice given to you by your health care provider. Make sure you discuss any questions you have with your health care provider.

## 2014-11-08 NOTE — ED Notes (Signed)
Pt is refusing IV placement at this time.  EDP made aware.    This RN made pt aware of reasoning for IV therapy for blood sugar.  Pt sts "I can treat this at home with my own medicines.  I need to go home.  I'm getting tired of waiting.  This happens to me every day, I know I can deal with it at home".

## 2014-11-16 ENCOUNTER — Emergency Department (INDEPENDENT_AMBULATORY_CARE_PROVIDER_SITE_OTHER)
Admission: EM | Admit: 2014-11-16 | Discharge: 2014-11-16 | Disposition: A | Discharge status: Home / Self Care | Payer: Medicare HMO | Source: Home / Self Care | Attending: Family Medicine | Admitting: Family Medicine

## 2014-11-16 ENCOUNTER — Encounter (HOSPITAL_COMMUNITY): Payer: Self-pay | Admitting: Emergency Medicine

## 2014-11-16 DIAGNOSIS — M545 Low back pain, unspecified: Secondary | ICD-10-CM

## 2014-11-16 DIAGNOSIS — M542 Cervicalgia: Secondary | ICD-10-CM

## 2014-11-16 MED ORDER — TRAMADOL HCL 50 MG PO TABS: 50.0000 mg | ORAL_TABLET | Freq: Four times a day (QID) | ORAL | Status: DC | PRN
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
## 2014-11-16 NOTE — Discharge Instructions (Signed)
Thank you for coming in today. Come back or go to the emergency room if you notice new weakness new numbness problems walking or bowel or bladder problems.   Back Exercises Back exercises help treat and prevent back injuries. The goal is to increase your strength in your belly (abdominal) and back muscles. These exercises can also help with flexibility. Start these exercises when told by your doctor. HOME CARE Back exercises include: Pelvic Tilt.  Lie on your back with your knees bent. Tilt your pelvis until the lower part of your back is against the floor. Hold this position 5 to 10 sec. Repeat this exercise 5 to 10 times. Knee to Chest.  Pull 1 knee up against your chest and hold for 20 to 30 seconds. Repeat this with the other knee. This may be done with the other leg straight or bent, whichever feels better. Then, pull both knees up against your chest. Sit-Ups or Curl-Ups.  Bend your knees 90 degrees. Start with tilting your pelvis, and do a partial, slow sit-up. Only lift your upper half 30 to 45 degrees off the floor. Take at least 2 to 3 seonds for each sit-up. Do not do sit-ups with your knees out straight. If partial sit-ups are difficult, simply do the above but with only tightening your belly (abdominal) muscles and holding it as told. Hip-Lift.  Lie on your back with your knees flexed 90 degrees. Push down with your feet and shoulders as you raise your hips 2 inches off the floor. Hold for 10 seconds, repeat 5 to 10 times. Back Arches.  Lie on your stomach. Prop yourself up on bent elbows. Slowly press on your hands, causing an arch in your low back. Repeat 3 to 5 times. Shoulder-Lifts.  Lie face down with arms beside your body. Keep hips and belly pressed to floor as you slowly lift your head and shoulders off the floor. Do not overdo your exercises. Be careful in the beginning. Exercises may cause you some mild back discomfort. If the pain lasts for more than 15 minutes, stop  the exercises until you see your doctor. Improvement with exercise for back problems is slow.  Document Released: 07/20/2010 Document Revised: 09/09/2011 Document Reviewed: 04/18/2011 [facility] Patient Information 2015 [place], [place]. This information is not intended to replace advice given to you by your health care provider. Make sure you discuss any questions you have with your health care provider.

## 2014-11-17 ENCOUNTER — Ambulatory Visit (INDEPENDENT_AMBULATORY_CARE_PROVIDER_SITE_OTHER): Payer: Medicare HMO | Admitting: Family Medicine

## 2014-11-17 ENCOUNTER — Encounter: Payer: Self-pay | Admitting: Family Medicine

## 2014-11-17 VITALS — BP 116/58 | HR 67 | Temp 98.1°F | Wt 168.0 lb

## 2014-11-17 DIAGNOSIS — M25511 Pain in right shoulder: Secondary | ICD-10-CM

## 2014-11-17 DIAGNOSIS — M542 Cervicalgia: Secondary | ICD-10-CM | POA: Diagnosis not present

## 2014-11-17 DIAGNOSIS — R0781 Pleurodynia: Secondary | ICD-10-CM

## 2014-11-17 DIAGNOSIS — M25512 Pain in left shoulder: Secondary | ICD-10-CM

## 2014-11-17 NOTE — Patient Instructions (Signed)
Nice to meet you. You injuries are likely due to strain of muscles in your neck and shoulder. We will obtain x-rays to further evaluate this to ensure no bony issues.  If you develop any numbness, weakness, tingling, worsening pain, or loss of bowel or bladder function please seek medical attention.
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
## 2014-11-18 ENCOUNTER — Telehealth: Payer: Self-pay | Admitting: Family Medicine

## 2014-11-18 ENCOUNTER — Ambulatory Visit (HOSPITAL_COMMUNITY)
Admission: RE | Admit: 2014-11-18 | Discharge: 2014-11-18 | Disposition: A | Discharge status: Home / Self Care | Payer: Medicare HMO | Source: Ambulatory Visit | Attending: Family Medicine | Admitting: Family Medicine

## 2014-11-18 DIAGNOSIS — M5032 Other cervical disc degeneration, mid-cervical region: Secondary | ICD-10-CM | POA: Insufficient documentation

## 2014-11-18 DIAGNOSIS — M542 Cervicalgia: Secondary | ICD-10-CM | POA: Diagnosis not present

## 2014-11-18 DIAGNOSIS — M25511 Pain in right shoulder: Secondary | ICD-10-CM | POA: Diagnosis not present

## 2014-11-18 DIAGNOSIS — R0781 Pleurodynia: Secondary | ICD-10-CM

## 2014-11-18 DIAGNOSIS — M25512 Pain in left shoulder: Secondary | ICD-10-CM | POA: Diagnosis not present

## 2014-11-18 NOTE — Telephone Encounter (Signed)
Called patient to inform of XR results. Bilateral shoulders without fracture or dislocation. Cervical spine with evidence of arthritis, though no fracture or listhesis. Rib film shows chronic rib fractures, though no acute rib fractures. Advised patient of these findings and that he should continue with treatment and return precautions as we discussed yesterday. He voiced understanding.

## 2014-11-21 ENCOUNTER — Encounter: Payer: Self-pay | Admitting: Family Medicine
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
## 2014-11-22 ENCOUNTER — Other Ambulatory Visit: Payer: Self-pay | Admitting: Family Medicine

## 2014-11-22 DIAGNOSIS — M542 Cervicalgia: Secondary | ICD-10-CM

## 2014-11-22 NOTE — Progress Notes (Signed)
Referral entered Adam Ray [name], MD  

## 2014-11-24 ENCOUNTER — Ambulatory Visit: Payer: Medicare HMO | Admitting: Physical Therapy

## 2014-11-24 ENCOUNTER — Encounter: Payer: Self-pay | Admitting: Family Medicine

## 2014-11-24 NOTE — Progress Notes (Signed)
Patient cancelled appointment at rehab because they said he was referred for neck pain and he says he needs the referral to be for back and neck pain.  He would like this to be scheduled soon.

## 2014-11-25 ENCOUNTER — Other Ambulatory Visit: Payer: Self-pay | Admitting: Family Medicine

## 2014-11-25 DIAGNOSIS — M542 Cervicalgia: Secondary | ICD-10-CM

## 2014-11-25 DIAGNOSIS — M549 Dorsalgia, unspecified: Secondary | ICD-10-CM

## 2014-11-25 NOTE — Progress Notes (Signed)
Referral updated. [name], MD

## 2014-11-25 NOTE — Progress Notes (Signed)
Patient informed. 

## 2014-11-30 ENCOUNTER — Ambulatory Visit: Payer: Commercial Managed Care - HMO | Admitting: Physical Therapy

## 2014-12-02 ENCOUNTER — Telehealth: Payer: Self-pay | Admitting: Family Medicine

## 2014-12-02 NOTE — Telephone Encounter (Signed)
Forms completed. Red team/[employer], please call pt and obtain permission to fax forms back to this company since it does contain his health information. Will give forms to [employer] to fax once permission obtained.  [name], MD

## 2014-12-02 NOTE — Telephone Encounter (Signed)
Checking status of paperwork for diabetic shoes, was faxed on 5/23, will refax again just in case

## 2014-12-05 NOTE — Telephone Encounter (Signed)
Left message on voicemail for patient to call back. 

## 2014-12-06 NOTE — Telephone Encounter (Signed)
Pt informed that paperwork for diabetic shoes was completed and will be faxed per his request.  Pt to pick up original copies.  Derl Barrow, RN

## 2014-12-12 ENCOUNTER — Ambulatory Visit: Payer: Commercial Managed Care - HMO | Attending: Family Medicine

## 2014-12-12 DIAGNOSIS — M545 Low back pain, unspecified: Secondary | ICD-10-CM

## 2014-12-12 DIAGNOSIS — M436 Torticollis: Secondary | ICD-10-CM | POA: Diagnosis not present

## 2014-12-12 DIAGNOSIS — R293 Abnormal posture: Secondary | ICD-10-CM | POA: Diagnosis not present

## 2014-12-12 DIAGNOSIS — M542 Cervicalgia: Secondary | ICD-10-CM | POA: Diagnosis not present

## 2014-12-12 DIAGNOSIS — M2560 Stiffness of unspecified joint, not elsewhere classified: Secondary | ICD-10-CM | POA: Insufficient documentation
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
## 2014-12-26 ENCOUNTER — Ambulatory Visit: Payer: Commercial Managed Care - HMO | Admitting: Physical Therapy

## 2014-12-26 DIAGNOSIS — M542 Cervicalgia: Secondary | ICD-10-CM | POA: Diagnosis not present

## 2014-12-26 DIAGNOSIS — M545 Low back pain, unspecified: Secondary | ICD-10-CM

## 2014-12-26 DIAGNOSIS — R293 Abnormal posture: Secondary | ICD-10-CM | POA: Diagnosis not present

## 2014-12-26 DIAGNOSIS — M2560 Stiffness of unspecified joint, not elsewhere classified: Secondary | ICD-10-CM

## 2014-12-26 DIAGNOSIS — M436 Torticollis: Secondary | ICD-10-CM

## 2014-12-26 NOTE — Patient Instructions (Addendum)
Knee to Chest (Flexion)   Pull knee toward chest. Feel stretch in lower back or buttock area. Breathing deeply, Hold _30___ seconds. Repeat with other knee. Repeat ___3_ times each leg. Do _2___ sessions per day.    Lumbar Rotation: Caudal - Bilateral (Supine)   Feet and knees together, arms outstretched, rock knees left and right, staying within shoulder distance ( man in picture is going too far) ,relaxing muscles of low back. Perform for 30 seconds. Relax. Repeat _3___ times per set.  Do __3__ sessions per day.   Levator Stretch   Grasp seat or sit on hand on side to be stretched. Turn head toward other side and look down. Use hand on head to gently stretch neck in that position. Hold _10___ seconds. Repeat on other side. Repeat _3___ times each way. Do 2____ sessions per day.  http://gt2.exer.us/30   Copyright  VHI. All rights reserved.  Side-Bending   One hand on opposite side of head, pull head to side as far as is comfortable. Stop if there is pain. Hold _10___ seconds. Repeat with other hand to other side. Repeat _3___ times each way. Do __2__ sessions per day.   Copyright  VHI. All rights reserved.  Scapular Retraction (Standing)   With arms at sides, pinch shoulder blades together. Repeat __10__ times per set. Do __2__ sets per session. Do _2___ sessions per day.  http://orth.exer.us/944   Copyright  VHI. All rights reserved.  Chin Protraction / Retraction   Slide head forward keeping chin level. Slide head back, pulling chin in. Hold each position _5__ seconds. Repeat _10__ times. Do _2__ sessions per day.  Copyright  VHI. All rights reserved.

## 2014-12-26 NOTE — Therapy (Signed)
[place], [place], [address] Phone: [phone]   Fax:  [phone]  Physical Therapy Treatment  Patient Details  Name: Adam Ray MRN: [id number] Date of Birth: [birth year] Referring Provider:  [name], MD  Encounter Date: 12/26/2014      PT End of Session - 12/26/14 0939    Visit Number 2   Number of Visits 16   Date for PT Re-Evaluation 02/06/15   PT Start Time 0933   PT Stop Time 9833   PT Time Calculation (min) 62 min      Past Medical History  Diagnosis Date  . Diabetes mellitus   . COPD (chronic obstructive pulmonary disease)   . Allergy   . Chronic kidney disease   . Bronchitis   . Gout   . Claudication     right foot ray resection    Past Surgical History  Procedure Laterality Date  . Surgery left great toe    . Tear ducts bilateral eyes    . I&d extremity  04/11/2012    Procedure: IRRIGATION AND DEBRIDEMENT EXTREMITY;  Surgeon: Wylene Simmer, MD;  Location: [place];  Service: Orthopedics;  Laterality: Right;    There were no vitals filed for this visit.  Visit Diagnosis:  Neck pain  Bilateral low back pain without sciatica  Abnormal posture  Joint stiffness of spine  Stiffness of neck      Subjective Assessment - 12/26/14 0937    Currently in Pain? Yes   Pain Score 7    Pain Location Back  neck   Pain Descriptors / Indicators --  like my muscles are pulling   Pain Type Acute pain   Pain Frequency Intermittent   Aggravating Factors  bening over   Pain Relieving Factors medication   Multiple Pain Sites Yes            [facility] PT Assessment - 12/26/14 0954    AROM   Cervical - Right Side Bend 35   Cervical - Left Side Bend 31   Cervical - Right Rotation 55   Cervical - Left Rotation 55                     [facility] Adult PT Treatment/Exercise - 12/26/14 0942    Neck Exercises: Seated   Neck Retraction 10 reps;5 secs   Other Seated  Exercise scap squeezes 5 sec x 10   Lumbar Exercises: Stretches   Single Knee to Chest Stretch 3 reps;20 seconds   Lower Trunk Rotation 3 reps;30 seconds   Modalities   Modalities Electrical Stimulation;Moist Heat   Moist Heat Therapy   Number Minutes Moist Heat 15 Minutes   Moist Heat Location Lumbar Spine;Cervical   Electrical Stimulation   Electrical Stimulation Location Neck and back    Electrical Stimulation Action Pre Mod   Electrical Stimulation Parameters Neck 7, back 18   Electrical Stimulation Goals Pain   Neck Exercises: Stretches   Upper Trapezius Stretch 3 reps;10 seconds   Levator Stretch 3 reps;10 seconds                PT Education - 12/26/14 0946    Education provided Yes   Education Details SELF CARE: Seated Posture , stretching HEP: single knee to chest, lower trunk rotation, upper trap and levator stretch   Person(s) Educated Patient   Methods Explanation;Handout   Comprehension Verbalized understanding          PT  Short Term Goals - 12/12/14 0847    PT SHORT TERM GOAL #1   Title Independent with inital HEP   Time 4   Period Weeks   Status New   PT SHORT TERM GOAL #2   Title pain inn neck reduced 30% or more    Time 4   Period Weeks   Status New   PT SHORT TERM GOAL #3   Title Pain in back reduced 30% or more   Time 4   Period Weeks   Status New   PT SHORT TERM GOAL #4   Title demo understanding of good posture   Time 4   Period Weeks   Status New           PT Long Term Goals - 12/12/14 0849    PT LONG TERM GOAL #1   Title He will be independent with all HEP issued as of last visit   Time 8   Period Weeks   Status New   PT LONG TERM GOAL #2   Title He will report returning to all normal activity with min to no pain   Time 8   Period Weeks   Status New   PT LONG TERM GOAL #3   Title He will report neck pain reduced by 80% and become intermittant without meds   Time 8   Status New   PT LONG TERM GOAL #4   Title He  will report pain in back reducecd 80% or less   Time 8   Period Weeks   Status New               Plan - 12/26/14 0950    Clinical Impression Statement First visit since eval. Focus today on initiating HEP and pain management. Pt reports decreased pain in neck to 5-6/10 before modalities and 4/10 after modalities. No goals met due to limited visits.    PT Next Visit Plan review HEP, start manual for neck         Problem List Patient Active Problem List   Diagnosis Date Noted  . URI (upper respiratory infection) 04/29/2014  . Lumbar back pain 03/21/2014  . Rash and nonspecific skin eruption 12/22/2013  . Screening for STD (sexually transmitted disease) 12/15/2013  . Depression 12/09/2013  . Lightheadedness 11/19/2013  . Genital warts 08/20/2013  . 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 Right leg pain 04/27/2013  . Diabetic retinopathy 01/28/2013  . Visit for well man health check 11/19/2010  . Sleep apnea 09/06/2010  . 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 DM (diabetes mellitus), type 2, uncontrolled 12/14/2009  . LEG EDEMA 03/03/2009  . Essential hypertension, benign 11/09/2008  . COPD, mild 10/06/2006  . SICKLE-CELL TRAIT 04/26/2006  . ERECTILE DYSFUNCTION 04/26/2006  . TOBACCO ABUSE 04/26/2006  . VENOUS INSUFFICIENCY 04/26/2006  . ALLERGIC RHINITIS 04/26/2006    Dorene Ar, PTA 12/26/2014, 10:35 AM  [facility] [address] [place], [place], [address] Phone: [phone]   Fax:  [phone]

## 2014-12-27 ENCOUNTER — Emergency Department (INDEPENDENT_AMBULATORY_CARE_PROVIDER_SITE_OTHER)
Admission: EM | Admit: 2014-12-27 | Discharge: 2014-12-27 | Disposition: A | Discharge status: Home / Self Care | Payer: Commercial Managed Care - HMO | Source: Home / Self Care | Attending: Family Medicine | Admitting: Family Medicine

## 2014-12-27 ENCOUNTER — Other Ambulatory Visit: Payer: Self-pay | Admitting: Family Medicine

## 2014-12-27 ENCOUNTER — Encounter (HOSPITAL_COMMUNITY): Payer: Self-pay | Admitting: *Deleted

## 2014-12-27 DIAGNOSIS — K0500 Acute gingivitis, plaque induced: Secondary | ICD-10-CM | POA: Diagnosis not present

## 2014-12-27 DIAGNOSIS — E1165 Type 2 diabetes mellitus with hyperglycemia: Secondary | ICD-10-CM

## 2014-12-27 DIAGNOSIS — IMO0002 Reserved for concepts with insufficient information to code with codable children: Secondary | ICD-10-CM

## 2014-12-27 MED ORDER — AMOXICILLIN 500 MG PO CAPS: 500.0000 mg | ORAL_CAPSULE | Freq: Three times a day (TID) | ORAL | Status: DC

## 2014-12-27 MED ORDER — CHLORHEXIDINE GLUCONATE 0.12 % MT SOLN: 10.0000 mL | Freq: Four times a day (QID) | OROMUCOSAL | Status: DC

## 2014-12-27 NOTE — Telephone Encounter (Signed)
Patient says he needs partial refills of Lantis  (4), Novolog (2), and Metformin sent to [facility] on [address].

## 2014-12-27 NOTE — ED Provider Notes (Signed)
CSN: [id number]     Arrival date & time 12/27/14  1712 History   First MD Initiated Contact with Patient 12/27/14 1816     Chief Complaint  Patient presents with  . Dental Pain   (Consider location/radiation/quality/duration/timing/severity/associated sxs/prior Treatment) Patient is a 59 y.o. male presenting with tooth pain. The history is provided by the patient.  Dental Pain Location:  Lower Lower teeth location:  31/RL 2nd molar Quality:  Dull Severity:  Mild Progression:  Unchanged Chronicity:  New Context: poor dentition   Context: not abscess and not dental caries   Context comment:  Edentulous Relieved by:  None tried Worsened by:  Nothing tried Associated symptoms: gum swelling   Associated symptoms: no fever and no neck pain   Risk factors: lack of dental care     Past Medical History  Diagnosis Date  . Diabetes mellitus   . COPD (chronic obstructive pulmonary disease)   . Allergy   . Chronic kidney disease   . Bronchitis   . Gout   . Claudication     right foot ray resection   Past Surgical History  Procedure Laterality Date  . Surgery left great toe    . Tear ducts bilateral eyes    . I&d extremity  04/11/2012    Procedure: IRRIGATION AND DEBRIDEMENT EXTREMITY;  Surgeon: Wylene Simmer, MD;  Location: [place];  Service: Orthopedics;  Laterality: Right;   Family History  Problem Relation Age of Onset  . Diabetes Mother   . Stroke Mother   . Heart failure Father    History  Substance Use Topics  . Smoking status: Current Every Day Smoker -- 0.30 packs/day for 48 years    Types: Cigars    Start date: 07/02/1963  . Smokeless tobacco: Current User     Comment: wants to use electric cigarettes.  not interested in pills, worried about chantix side effects  . Alcohol Use: No    Review of Systems  Constitutional: Negative.  Negative for fever.  HENT: Positive for dental problem.   Musculoskeletal: Negative for neck pain.    Allergies  Review of  patient's allergies indicates no known allergies.  Home Medications   Prior to Admission medications   Medication Sig Start Date End Date Taking? Authorizing Provider  albuterol (PROVENTIL HFA;VENTOLIN HFA) 108 (90 BASE) MCG/ACT inhaler Inhale 2 puffs into the lungs every 6 (six) hours as needed for wheezing or shortness of breath. 04/29/14   Timmothy Euler, MD  amoxicillin (AMOXIL) 500 MG capsule Take 1 capsule (500 mg total) by mouth 3 (three) times daily. 12/27/14   Billy Fischer, MD  aspirin EC 81 MG tablet Take 81 mg by mouth daily.    Historical Provider, MD  chlorhexidine (PERIDEX) 0.12 % solution Use as directed 10 mLs in the mouth or throat 4 (four) times daily. 12/27/14   Billy Fischer, MD  furosemide (LASIX) 20 MG tablet TAKE 1 TABLET BY MOUTH EVERY DAY MAY TAKE UP TO 2 PER DAY    [name], MD  ibuprofen (ADVIL,MOTRIN) 200 MG tablet Take 400 mg by mouth every 6 (six) hours as needed for moderate pain.     Historical Provider, MD  insulin aspart (NOVOLOG) 100 UNIT/ML injection 25 UNITS WITH YOUR BIGGEST MEAL OF THE DAY if sugar is over 300. DO NOT TAKE IF YOU DO NOT EAT THAT DAY. 09/22/14   [name], MD  insulin detemir (LEVEMIR) 100 UNIT/ML injection Inject 0.5 mLs (50 Units  total) into the skin 2 (two) times daily. 09/22/14   [name], MD  metFORMIN (GLUCOPHAGE) 1000 MG tablet Take 1 tablet (1,000 mg total) by mouth 2 (two) times daily with a meal. 01/20/14   [name], MD  methocarbamol (ROBAXIN) 750 MG tablet Take 1 tablet (750 mg total) by mouth every 6 (six) hours as needed for muscle spasms (and pain). Patient not taking: Reported on 12/12/2014 11/08/14   Clayton Bibles, PA-C  quinapril (ACCUPRIL) 5 MG tablet Take 0.5 tablets (2.5 mg total) by mouth daily. 11/24/12   Carolin Guernsey, MD  rosuvastatin (CRESTOR) 20 MG tablet Take 1 tablet (20 mg total) by mouth daily. 09/22/14   [name], MD  sildenafil (VIAGRA) 100 MG tablet Take 100 mg  by mouth daily as needed for erectile dysfunction.    Historical Provider, MD  traMADol (ULTRAM) 50 MG tablet Take 1 tablet (50 mg total) by mouth every 6 (six) hours as needed. Patient not taking: Reported on 12/12/2014 11/16/14   Gregor Hams, MD   BP 114/69 mmHg  Pulse 59  Temp(Src) 98.1 F (36.7 C) (Oral)  Resp 12  SpO2 97% Physical Exam  Constitutional: He is oriented to person, place, and time. He appears well-developed and well-nourished. No distress.  HENT:  Right Ear: External ear normal.  Left Ear: External ear normal.  Mouth/Throat: Oropharynx is clear and moist.    Neck: Normal range of motion. Neck supple.  Lymphadenopathy:    He has no cervical adenopathy.  Neurological: He is alert and oriented to person, place, and time.  Skin: Skin is warm and dry.  Nursing note and vitals reviewed.   ED Course  Procedures (including critical care time) Labs Review Labs Reviewed - No data to display  Imaging Review No results found.   MDM   1. Acute gingivitis        Billy Fischer, MD 12/27/14 [phone]

## 2014-12-27 NOTE — Telephone Encounter (Signed)
Red team, please call pt and find out how much lantus he is taking. I last saw him over 2 months ago for his diabetes and he was on levemir at that time. I need to know his current dosing so I can send in refills.  Thanks, [name], MD

## 2014-12-27 NOTE — Discharge Instructions (Signed)
Take medicine as prescribed, see dentist as soon as possible

## 2014-12-27 NOTE — Telephone Encounter (Signed)
Said he needs refills on : lantus 4 bottles Novolog-2  Metformin " the creme"

## 2014-12-27 NOTE — Telephone Encounter (Signed)
Pt called and would like the doctor to fax a copy of ALL medications he is currently on to [facility]. This needs to include his DOB April 23, 2056, address [place] [address], and his insurance number # [id number]. Please fax all this to [phone]. [email or web address]

## 2014-12-27 NOTE — ED Notes (Signed)
Pt  Is  Adam Ray   He  Reports pain  r  Upper  Gum   For  About  1  Week      he  Is  Awake  And  Alert  And  Oriented   Speaking in  Complete  sentances

## 2014-12-28 ENCOUNTER — Ambulatory Visit: Payer: Commercial Managed Care - HMO

## 2014-12-28 DIAGNOSIS — M436 Torticollis: Secondary | ICD-10-CM | POA: Diagnosis not present

## 2014-12-28 DIAGNOSIS — M545 Low back pain, unspecified: Secondary | ICD-10-CM

## 2014-12-28 DIAGNOSIS — M2560 Stiffness of unspecified joint, not elsewhere classified: Secondary | ICD-10-CM

## 2014-12-28 DIAGNOSIS — M542 Cervicalgia: Secondary | ICD-10-CM | POA: Diagnosis not present

## 2014-12-28 DIAGNOSIS — R293 Abnormal posture: Secondary | ICD-10-CM | POA: Diagnosis not present

## 2014-12-28 NOTE — Telephone Encounter (Signed)
Spoke with patient this morning. He wanted to know if nurse sent the refill request for the wart cream to PCP.  Informed patient that the request for sent.  Derl Barrow, RN

## 2014-12-28 NOTE — Telephone Encounter (Signed)
Pt called back and would like to speak with a  nurse

## 2014-12-28 NOTE — Therapy (Addendum)
[place], [place], [address] Phone: [phone]   Fax:  [phone]  Physical Therapy Treatment  Patient Details  Name: Adam Ray MRN: [id number] Date of Birth: May 03, 1956 Referring Provider:  [name], MD  Encounter Date: 12/28/2014      PT End of Session - 12/28/14 0844    Visit Number 3   Number of Visits 16   Date for PT Re-Evaluation 02/06/15   PT Start Time 0800   PT Stop Time 0900   PT Time Calculation (min) 60 min   Activity Tolerance Patient tolerated treatment well   Behavior During Therapy [facility] for tasks assessed/performed      Past Medical History  Diagnosis Date  . Diabetes mellitus   . COPD (chronic obstructive pulmonary disease)   . Allergy   . Chronic kidney disease   . Bronchitis   . Gout   . Claudication     right foot ray resection    Past Surgical History  Procedure Laterality Date  . Surgery left great toe    . Tear ducts bilateral eyes    . I&d extremity  04/11/2012    Procedure: IRRIGATION AND DEBRIDEMENT EXTREMITY;  Surgeon: Wylene Simmer, MD;  Location: [facility];  Service: Orthopedics;  Laterality: Right;    There were no vitals filed for this visit.  Visit Diagnosis:  Neck pain  Bilateral low back pain without sciatica  Joint stiffness of spine  Stiffness of neck      Subjective Assessment - 12/28/14 0806    Subjective Doing alot better than last week   Currently in Pain? Yes   Pain Score 4    Pain Location Neck   Pain Orientation Right;Left;Posterior   Pain Descriptors / Indicators --  pulling in muscles   Pain Type Acute pain   Pain Onset More than a month ago   Pain Frequency Constant   Aggravating Factors  bending   Pain Relieving Factors medication   Multiple Pain Sites Yes                         [facility] Adult PT Treatment/Exercise - 12/28/14 0809    Neck Exercises: Seated   Other Seated Exercise Reviewed HEP  and he was able to do all but needed some cues to not pull too hard and for psoitions of head  and wa able to do after cueing. cervical and scapula squeezes.    Lumbar Exercises: Stretches   Single Knee to Chest Stretch 2 reps;30 seconds  RT and LT   Lower Trunk Rotation 2 reps;30 seconds   Lumbar Exercises: Aerobic   Stationary Bike Nustep L3 UE and LE 5 min   Moist Heat Therapy   Number Minutes Moist Heat 15 Minutes   Moist Heat Location Lumbar Spine;Cervical   Manual Therapy   Manual Therapy Soft tissue mobilization                  PT Short Term Goals - 12/28/14 0846    PT SHORT TERM GOAL #1   Title Independent with inital HEP   Status Achieved   PT SHORT TERM GOAL #2   Title pain inn neck reduced 30% or more    Status Achieved   PT SHORT TERM GOAL #3   Title Pain in back reduced 30% or more   Status Achieved           PT Long  Term Goals - 12/12/14 0849    PT LONG TERM GOAL #1   Title He will be independent with all HEP issued as of last visit   Time 8   Period Weeks   Status New   PT LONG TERM GOAL #2   Title He will report returning to all normal activity with min to no pain   Time 8   Period Weeks   Status New   PT LONG TERM GOAL #3   Title He will report neck pain reduced by 80% and become intermittant without meds   Time 8   Status New   PT LONG TERM GOAL #4   Title He will report pain in back reducecd 80% or less   Time 8   Period Weeks   Status New               Plan - 12/28/14 3354    Clinical Impression Statement Subjectively he is improving. He was able to do HEP.   We will progress to exercises with band.    PT Next Visit Plan review HEP, manual for neck and back, band exercises, modalities PRN   Consulted and Agree with Plan of Care Patient        Problem List Patient Active Problem List   Diagnosis Date Noted  . URI (upper respiratory infection) 04/29/2014  . Lumbar back pain 03/21/2014  . Rash and nonspecific skin  eruption 12/22/2013  . Screening for STD (sexually transmitted disease) 12/15/2013  . Depression 12/09/2013  . Lightheadedness 11/19/2013  . Genital warts 08/20/2013  . 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 Right leg pain 04/27/2013  . Diabetic retinopathy 01/28/2013  . Visit for well man health check 11/19/2010  . Sleep apnea 09/06/2010  . 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 DM (diabetes mellitus), type 2, uncontrolled 12/14/2009  . LEG EDEMA 03/03/2009  . Essential hypertension, benign 11/09/2008  . COPD, mild 10/06/2006  . SICKLE-CELL TRAIT 04/26/2006  . ERECTILE DYSFUNCTION 04/26/2006  . TOBACCO ABUSE 04/26/2006  . VENOUS INSUFFICIENCY 04/26/2006  . ALLERGIC RHINITIS 04/26/2006    Darrel Hoover PT 12/28/2014, 8:47 AM  [facility] [address] [place], [place], [address] Phone: [phone]   Fax:  [phone]     PHYSICAL THERAPY DISCHARGE SUMMARY  Visits from Start of Care: 3  Current functional level related to goals / functional outcomes: He came in today to discharge himself due to feeling better   Remaining deficits: Unable to assess due to self discharge   Education / Equipment: HEP Plan: Patient agrees to discharge.  Patient goals were partially met. Patient is being discharged due to being pleased with the current functional level.  ?????   [place] PT   01/03/15     2:31 PM

## 2014-12-28 NOTE — Telephone Encounter (Signed)
Patient states he was never taking Levemir, states he is on Lantus 100 U every AM. Patient states he would like a 90-day supply on all refills. Will forward to MD.

## 2014-12-29 MED ORDER — METFORMIN HCL 1000 MG PO TABS: 1000.0000 mg | ORAL_TABLET | Freq: Two times a day (BID) | ORAL | Status: DC

## 2014-12-29 MED ORDER — INSULIN ASPART 100 UNIT/ML ~~LOC~~ SOLN: SUBCUTANEOUS | Status: DC

## 2014-12-29 MED ORDER — INSULIN GLARGINE 100 UNIT/ML ~~LOC~~ SOLN
100.0000 [IU] | Freq: Every day | SUBCUTANEOUS | Status: DC
Start: 2014-12-29 — End: 2015-01-10

## 2014-12-29 NOTE — Telephone Encounter (Signed)
rx sent in for metformin, lantus, and novolog. Will not refill aldara as we haven't seen him for warts in some time and this isn't a medicine that should be continued indefinitely.  [name], MD

## 2015-01-03 ENCOUNTER — Ambulatory Visit: Payer: Commercial Managed Care - HMO

## 2015-01-03 ENCOUNTER — Telehealth: Payer: Self-pay | Admitting: *Deleted

## 2015-01-03 NOTE — Telephone Encounter (Signed)
Received a fax from [facility] needing clarification in Lantus dosage.  Fax placed in provider box for review.  Derl Barrow, RN

## 2015-01-05 ENCOUNTER — Ambulatory Visit: Payer: Commercial Managed Care - HMO

## 2015-01-05 NOTE — Telephone Encounter (Signed)
From what I can tell from last OV note in April, patient is not currently taking Lantus but instead on Levemir.  Will leave on Levemir and have patient FU with PCP rather than switching back to Lantus.

## 2015-01-09 ENCOUNTER — Ambulatory Visit (INDEPENDENT_AMBULATORY_CARE_PROVIDER_SITE_OTHER): Payer: Commercial Managed Care - HMO | Admitting: Family Medicine

## 2015-01-09 ENCOUNTER — Encounter: Payer: Self-pay | Admitting: Family Medicine

## 2015-01-09 VITALS — BP 113/68 | HR 69 | Temp 98.3°F | Wt 164.0 lb

## 2015-01-09 DIAGNOSIS — A630 Anogenital (venereal) warts: Secondary | ICD-10-CM

## 2015-01-09 DIAGNOSIS — E1165 Type 2 diabetes mellitus with hyperglycemia: Secondary | ICD-10-CM | POA: Diagnosis not present

## 2015-01-09 DIAGNOSIS — F17200 Nicotine dependence, unspecified, uncomplicated: Secondary | ICD-10-CM

## 2015-01-09 DIAGNOSIS — IMO0002 Reserved for concepts with insufficient information to code with codable children: Secondary | ICD-10-CM

## 2015-01-09 DIAGNOSIS — Z1211 Encounter for screening for malignant neoplasm of colon: Secondary | ICD-10-CM

## 2015-01-09 DIAGNOSIS — Z720 Tobacco use: Secondary | ICD-10-CM | POA: Diagnosis not present

## 2015-01-09 LAB — POCT GLYCOSYLATED HEMOGLOBIN (HGB A1C): Hemoglobin A1C: 10.7

## 2015-01-09 NOTE — Patient Instructions (Addendum)
Getting CT scan of your lungs to screen for lung cancer I am referring you to a GI doctor to talk to them about your colonoscopy. You will get a phone call to schedule this appointment.  Let me know if the warts return Fill out blood sugar chart Follow up with me in 3 weeks  Be well, Dr. [name]

## 2015-01-09 NOTE — Assessment & Plan Note (Signed)
Will obtain low-dose CT chest without contrast for routine lung cancer screening given active tobacco use. No pulmonary symptoms presently.

## 2015-01-09 NOTE — Assessment & Plan Note (Signed)
Did not have time to discuss diabetes in detail with patient today. A1c was done and was 10.7. Gave patient printed CBG chart to complete. He will follow-up with me in 3 weeks to discuss his diabetes in greater detail.

## 2015-01-09 NOTE — Assessment & Plan Note (Signed)
Discussed with patient that he does not appear to be due for another colonoscopy for another 2 years. He is still adamant that he would like to have one done. I advised that I would refer him to a gastroenterologist, so that he can discuss these concerns. He was appreciative of the referral.

## 2015-01-09 NOTE — Assessment & Plan Note (Signed)
Discussed with patient the podofilox is not a good treatment for long-term use as he has been using. He currently does not have any warts. Advised that we need to stop this medication, and if his warts recur, we can discuss other treatment options.

## 2015-01-09 NOTE — Progress Notes (Signed)
Patient ID: Adam Ray, male   DOB: Nov 06, 1955, 59 y.o.   MRN: [id number]  HPI:  Patient presents today to follow-up on multiple items.  Colonoscopy: States he would like to have a colonoscopy now. He has a bowel movement about 1-2 times per week. Denies any blood in his stool or diarrhea. Does not strain to have a bowel movement. Denies any abdominal pain or weight loss. Last colonoscopy appears to have been in 2008, and patient reports being told he needed a follow-up colonoscopy in 10 years. Despite this, he requests having one now.  "Check my lungs": Also states he wants his lungs checked. Currently smokes 5 cigars per day. States his breathing is doing well. Denies any persistent or problematic shortness of breath. No chest pain. Uses albuterol as needed, less than one to 2 times per week.  Genital warts: Currently does not have any warts. He has been using the podofilox every few days. He would like a refill of this.  Diabetes: Currently taking Lantus 100 units per day. In the morning checks blood sugar and gets 140-160. In the afternoon is getting around 220. He also takes NovoLog 25 units with his largest meal of the day. He was previously on Levemir due to an insurance issue, but states he switched back to Lantus about 2 months ago.  ROS: See HPI.  [place]: History of diabetes, hypertension, hyperlipidemia, diabetic retinopathy, genital warts, COPD  PHYSICAL EXAM: BP 113/68 mmHg  Pulse 69  Temp(Src) 98.3 F (36.8 C) (Oral)  Wt 164 lb (74.39 kg) Gen: No acute distress, pleasant, cooperative HEENT: Normocephalic, atraumatic Heart: Regular rate and rhythm, no murmurs Lungs: Clear to auscultation bilaterally, normal respiratory effort Neuro: Grossly nonfocal, speech normal  Abdomen: Soft, nontender to palpation, no masses or  organomegaly Ext: No edema bilateral lower extremities  ASSESSMENT/PLAN:  Genital warts Discussed with patient the podofilox is not a good treatment for  long-term use as he has been using. He currently does not have any warts. Advised that we need to stop this medication, and if his warts recur, we can discuss other treatment options.  Screening for colon cancer Discussed with patient that he does not appear to be due for another colonoscopy for another 2 years. He is still adamant that he would like to have one done. I advised that I would refer him to a gastroenterologist, so that he can discuss these concerns. He was appreciative of the referral.  TOBACCO ABUSE Will obtain low-dose CT chest without contrast for routine lung cancer screening given active tobacco use. No pulmonary symptoms presently.  DM (diabetes mellitus), type 2, uncontrolled Did not have time to discuss diabetes in detail with patient today. A1c was done and was 10.7. Gave patient printed CBG chart to complete. He will follow-up with me in 3 weeks to discuss his diabetes in greater detail.   FOLLOW UP: F/u in 3 weeks for diabetes Referring to GI to discuss colonoscopy  [name], [place]

## 2015-01-10 ENCOUNTER — Ambulatory Visit: Payer: Commercial Managed Care - HMO

## 2015-01-10 ENCOUNTER — Other Ambulatory Visit: Payer: Self-pay | Admitting: *Deleted

## 2015-01-10 MED ORDER — INSULIN GLARGINE 100 UNIT/ML ~~LOC~~ SOLN: 100.0000 [IU] | Freq: Every day | SUBCUTANEOUS | Status: DC

## 2015-01-10 MED ORDER — INSULIN ASPART 100 UNIT/ML ~~LOC~~ SOLN: SUBCUTANEOUS | Status: DC

## 2015-01-10 MED ORDER — METFORMIN HCL 1000 MG PO TABS: 1000.0000 mg | ORAL_TABLET | Freq: Two times a day (BID) | ORAL | Status: DC

## 2015-01-17 ENCOUNTER — Encounter: Payer: Self-pay | Admitting: Family Medicine

## 2015-01-17 ENCOUNTER — Telehealth: Payer: Self-pay | Admitting: *Deleted

## 2015-01-17 NOTE — Telephone Encounter (Signed)
Received phone call from [facility].  Pt does not meet requirement for low dose CT scan.  Dr. [name] informed, CT scan cancelled and pt informed. Lavone Barrientes, [place] Spotted

## 2015-01-18 ENCOUNTER — Inpatient Hospital Stay: Admission: RE | Admit: 2015-01-18 | Payer: Commercial Managed Care - HMO | Source: Ambulatory Visit

## 2015-01-18 NOTE — Telephone Encounter (Signed)
We were informed that despite pt's history of smoking for 50 years (since age 59), because he does not smoke enough to quantify as >30 pack-years, he's not eligible for this screening test. Confirmed this smoking history with patient on the phone yesterday. He states the most he's ever smoked is 5 cigarettes or cigars per day.  [name], MD

## 2015-01-20 ENCOUNTER — Ambulatory Visit: Payer: Commercial Managed Care - HMO | Admitting: Family Medicine

## 2015-01-20 ENCOUNTER — Encounter: Payer: Self-pay | Admitting: Internal Medicine

## 2015-01-23 ENCOUNTER — Encounter: Payer: Medicare HMO | Admitting: Physical Therapy

## 2015-01-25 ENCOUNTER — Encounter: Payer: Medicare HMO | Admitting: Physical Therapy

## 2015-01-31 ENCOUNTER — Encounter: Payer: Self-pay | Admitting: Family Medicine

## 2015-01-31 ENCOUNTER — Ambulatory Visit (INDEPENDENT_AMBULATORY_CARE_PROVIDER_SITE_OTHER): Payer: Commercial Managed Care - HMO | Admitting: Family Medicine

## 2015-01-31 VITALS — BP 123/67 | HR 54 | Temp 97.6°F | Ht 70.0 in | Wt 164.5 lb

## 2015-01-31 DIAGNOSIS — E1165 Type 2 diabetes mellitus with hyperglycemia: Secondary | ICD-10-CM

## 2015-01-31 DIAGNOSIS — IMO0002 Reserved for concepts with insufficient information to code with codable children: Secondary | ICD-10-CM

## 2015-01-31 DIAGNOSIS — R911 Solitary pulmonary nodule: Secondary | ICD-10-CM

## 2015-01-31 LAB — BASIC METABOLIC PANEL
BUN: 11 mg/dL (ref 7–25)
CO2: 28 mmol/L (ref 20–31)
Calcium: 8.5 mg/dL — ABNORMAL LOW (ref 8.6–10.3)
Chloride: 105 mmol/L (ref 98–110)
Creat: 1.05 mg/dL (ref 0.70–1.33)
Glucose, Bld: 268 mg/dL — ABNORMAL HIGH (ref 65–99)
Potassium: 4.5 mmol/L (ref 3.5–5.3)
SODIUM: 139 mmol/L (ref 135–146)

## 2015-01-31 NOTE — Patient Instructions (Addendum)
Check your blood sugar 3 times a day: - in the morning when you first wake up - before your main daily meal - 2 hours after your main daily meal  Continue current insulin regimen. Call us if you have any more low blood sugars.  Getting CT scan to follow up on the lung nodule.  Follow up with me in 3 weeks for your diabetes.  Be well, Dr. [name]

## 2015-01-31 NOTE — Progress Notes (Signed)
Patient ID: Adam Ray, male   DOB: 08/01/1955, 59 y.o.   MRN: [id number]  HPI:  Diabetes: Currently taking Lantus 100 units daily. Doses this at night. Also is occasionally taking NovoLog 25 units daily with his biggest meal of the day. Patient reports he only eats once a day. In general, his sugars have been elevated but he did have one day where he had a blood sugar of less than 100 in the morning.  Otherwise, morning sugars seem to have been elevated. Another day he had a sugar of around 63 in the afternoon. He ate something sweet and this improved. He admits to not routinely checking his blood sugar in the morning. He did not bring his blood sugar chart with him today.  CT of lungs: recently we attempted to schedule a low-dose screening CT scan given his smoking history. However, despite his prolonged smoking history of over 50 years, he does not qualify for this as he does not smoke enough per day to meet 30 pack years. He does have a history of a pulmonary nodule which was stable when last scanned back in 2014. He would like to schedule a follow-up CT to ensure this is not enlarging.  Request for colonoscopy: We discussed this at his last office visit. Has upcoming appointment with GI to discuss getting an early colonoscopy.  ROS: See HPI.  [place]: hx poorly controlled DM, longstanding smoking  PHYSICAL EXAM: BP 123/67 mmHg  Pulse 54  Temp(Src) 97.6 F (36.4 C) (Oral)  Ht 5\' 10"  (1.778 m)  Wt 164 lb 8 oz (74.617 kg)  BMI 23.60 kg/m2 Gen: NAD, pleasant, cooperative HEENT: NCAT Heart: RRR no murmur Lungs: CTAB NWOB Neuro: grossly nonfocal, speech normal Ext: atraumatic  ASSESSMENT/PLAN:  DM (diabetes mellitus), type 2, uncontrolled Unable to make many changes to insulin regimen today as pt not reliable about checking at home. He does report a low blood sugar or two, but states these have not recurred since that time. Printed new CBG chart to give to patient and stressed importance  of him checking sugar each morning, before his daily meal, and 2 hours after daily meal. Will continue current regimen and have pt f/u in a couple of weeks for insulin adjustment. He will call us if he has any more low sugars.  Pulmonary nodule Schedule CT scan w contrast to f/u on nodule BMET today to eval renal fxn prior to IV contrast   FOLLOW UP: F/u in 3 weeks for adjustment of insulin  [name], [place]

## 2015-02-01 ENCOUNTER — Telehealth: Payer: Self-pay | Admitting: Family Medicine

## 2015-02-01 ENCOUNTER — Encounter: Payer: Self-pay | Admitting: Family Medicine

## 2015-02-01 DIAGNOSIS — R911 Solitary pulmonary nodule: Secondary | ICD-10-CM | POA: Insufficient documentation

## 2015-02-01 NOTE — Assessment & Plan Note (Signed)
Schedule CT scan w contrast to f/u on nodule BMET today to eval renal fxn prior to IV contrast

## 2015-02-01 NOTE — Telephone Encounter (Signed)
[place] placed to Dr. Alanda Slim, Josem Kaufmann # [phone]. Showing suspended at the moment.

## 2015-02-01 NOTE — Telephone Encounter (Signed)
Mr. Cammack calling and needs and referral to [facility].  Address: [address] [place], [place] [address]; Phone: [phone]  Thanks, Fonda Kinder, ASA

## 2015-02-01 NOTE — Assessment & Plan Note (Signed)
Unable to make many changes to insulin regimen today as pt not reliable about checking at home. He does report a low blood sugar or two, but states these have not recurred since that time. Printed new CBG chart to give to patient and stressed importance of him checking sugar each morning, before his daily meal, and 2 hours after daily meal. Will continue current regimen and have pt f/u in a couple of weeks for insulin adjustment. He will call us if he has any more low sugars.

## 2015-02-03 DIAGNOSIS — E11359 Type 2 diabetes mellitus with proliferative diabetic retinopathy without macular edema: Secondary | ICD-10-CM | POA: Diagnosis not present

## 2015-02-03 DIAGNOSIS — H2513 Age-related nuclear cataract, bilateral: Secondary | ICD-10-CM | POA: Diagnosis not present

## 2015-02-07 ENCOUNTER — Ambulatory Visit (HOSPITAL_COMMUNITY): Payer: Commercial Managed Care - HMO | Attending: Family Medicine

## 2015-02-21 ENCOUNTER — Ambulatory Visit (INDEPENDENT_AMBULATORY_CARE_PROVIDER_SITE_OTHER): Payer: Commercial Managed Care - HMO | Admitting: Family Medicine

## 2015-02-21 ENCOUNTER — Encounter: Payer: Self-pay | Admitting: Family Medicine

## 2015-02-21 VITALS — BP 141/75 | HR 58 | Temp 97.9°F | Ht 70.0 in | Wt 165.0 lb

## 2015-02-21 DIAGNOSIS — R911 Solitary pulmonary nodule: Secondary | ICD-10-CM | POA: Diagnosis not present

## 2015-02-21 DIAGNOSIS — M25511 Pain in right shoulder: Secondary | ICD-10-CM | POA: Diagnosis not present

## 2015-02-21 DIAGNOSIS — A630 Anogenital (venereal) warts: Secondary | ICD-10-CM | POA: Diagnosis not present

## 2015-02-21 DIAGNOSIS — M25519 Pain in unspecified shoulder: Secondary | ICD-10-CM | POA: Insufficient documentation

## 2015-02-21 NOTE — Assessment & Plan Note (Signed)
Suspect rotator cuff tendonopathy/impingement syndrome. Discussed possibility of steroid injection with patient today but he declined (which I am certainly okay with, as it would require more intensive monitoring of his CBG's which he has not demonstrated his willingness to do). Will obtain xrays of shoulder & refer to sports medicine for further eval. May benefit from physical therapy.

## 2015-02-21 NOTE — Assessment & Plan Note (Signed)
No recurrence, continue to monitor.

## 2015-02-21 NOTE — Patient Instructions (Signed)
Fill out the blood sugar chart  Rescheduling CT scan  Let us know if warts return  Getting xrays of shoulder and referring to sports medicine.  Follow up in 3-4 weeks for diabetes  Be well, Dr. [name]

## 2015-02-21 NOTE — Progress Notes (Signed)
Patient ID: Adam Ray, male   DOB: [birth year], 59 y.o.   MRN: [id number]  HPI:  R shoulder pain - has had pain for several months, gradual in onset, no preceding injury. No fevers. Pain worse with use of the shoulder. L handed. No weakness or sensory issues in arm. 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 Does not want steroid injection today.  Chest nodule - no showed to his CT scan appointment for his pulmonary nodule. States he did not know when the appointment was. Would like this rescheduled.  Genital warts - no recurrence of warts. Reports he got a call saying he missed an appointment "in [facility]" for his warts. Upon chart review, we have not referred him to dermatology in appx 1.5 years. Pt does not think he needs to see dermatology right now.  Note, this visit was scheduled for f/u of diabetes but pt did not bring blood sugar chart with him to this visit and thus is willing to discuss insulin regimen another day once he has the chart with him.  ROS: See HPI.  [place]: hx allergic rhinitis, bicuspid aortic valve, depression, DM, COPD, genital warts, sleep apnea  PHYSICAL EXAM: BP 141/75 mmHg  Pulse 58  Temp(Src) 97.9 F (36.6 C) (Oral)  Ht 5\' 10"  (1.778 m)  Wt 165 lb (74.844 kg)  BMI 23.68 kg/m2 Gen: NAD HEENT: NCAT Neuro: grossly nonfocal, speech nonfocal Ext: R shoulder without TTP. Atraumatic in appearance. Slightly decreased active ROM but able to abduct shoulder nearly completely. Good strength in biceps. Good grip strength.  ASSESSMENT/PLAN:  Pain in joint, shoulder region Suspect rotator cuff tendonopathy/impingement syndrome. Discussed possibility of steroid injection with patient today but he declined (which I am certainly okay with, as it would require more intensive monitoring of his CBG's which he has not demonstrated his willingness to do). Will obtain xrays of shoulder & refer to sports medicine for  further eval. May benefit from physical therapy.  Genital warts No recurrence, continue to monitor.  Pulmonary nodule F/u CT scan rescheduled again for patient since he no showed to last CT appointment.   FOLLOW UP: F/u in several weeks for DM Referring to sports medicine  [name], [place]

## 2015-02-21 NOTE — Assessment & Plan Note (Signed)
F/u CT scan rescheduled again for patient since he no showed to last CT appointment.

## 2015-02-27 ENCOUNTER — Ambulatory Visit (HOSPITAL_COMMUNITY): Admission: RE | Admit: 2015-02-27 | Payer: Commercial Managed Care - HMO | Source: Ambulatory Visit

## 2015-03-01 ENCOUNTER — Ambulatory Visit: Payer: Commercial Managed Care - HMO | Admitting: Family Medicine

## 2015-03-16 ENCOUNTER — Ambulatory Visit: Payer: Commercial Managed Care - HMO | Admitting: Family Medicine

## 2015-03-27 ENCOUNTER — Ambulatory Visit (INDEPENDENT_AMBULATORY_CARE_PROVIDER_SITE_OTHER): Payer: Commercial Managed Care - HMO | Admitting: Internal Medicine

## 2015-03-27 ENCOUNTER — Encounter: Payer: Self-pay | Admitting: Internal Medicine

## 2015-03-27 VITALS — BP 108/70 | HR 72 | Ht 70.0 in | Wt 154.0 lb

## 2015-03-27 DIAGNOSIS — Z1211 Encounter for screening for malignant neoplasm of colon: Secondary | ICD-10-CM | POA: Diagnosis not present

## 2015-03-27 DIAGNOSIS — R194 Change in bowel habit: Secondary | ICD-10-CM

## 2015-03-27 DIAGNOSIS — K5900 Constipation, unspecified: Secondary | ICD-10-CM

## 2015-03-27 DIAGNOSIS — IMO0001 Reserved for inherently not codable concepts without codable children: Secondary | ICD-10-CM

## 2015-03-27 DIAGNOSIS — E119 Type 2 diabetes mellitus without complications: Secondary | ICD-10-CM | POA: Diagnosis not present

## 2015-03-27 DIAGNOSIS — Z794 Long term (current) use of insulin: Secondary | ICD-10-CM

## 2015-03-27 MED ORDER — NA SULFATE-K SULFATE-MG SULF 17.5-3.13-1.6 GM/177ML PO SOLN: ORAL | Status: DC

## 2015-03-27 NOTE — Progress Notes (Signed)
HISTORY OF PRESENT ILLNESS:  Adam Ray is a 59 y.o. male with multiple medical problems including insulin requiring diabetes mellitus who is sent today by his primary care provider Dr. Ardelia Ray regarding change in bowel habits and colonoscopy. Patient is accompanied by his wife. He reports having undergone colonoscopy about 8 years ago. No report available. However, pathology report in the electronic medical record shows pathology from May 2008 with hyperplastic colon polyps. Patient states that he has had progressive constipation over the past year. This has been associated with some abdominal discomfort but no bleeding. He denies weight loss. He is concerned about colon cancer. No family history of colon cancer in first-degree relatives. GI review of systems is otherwise negative. He has not been using any agents to help with his constipation.  REVIEW OF SYSTEMS:  All non-GI ROS negative except for arthritis, visual change, hearing problems  Past Medical History  Diagnosis Date  . Diabetes mellitus   . COPD (chronic obstructive pulmonary disease)   . Allergy   . Chronic kidney disease   . Bronchitis   . Gout   . Claudication     right foot ray resection  . Genital warts   . Colon polyps     hyperplastic    Past Surgical History  Procedure Laterality Date  . Surgery left great toe    . Tear ducts bilateral eyes    . I&d extremity  04/11/2012    Procedure: IRRIGATION AND DEBRIDEMENT EXTREMITY;  Surgeon: Wylene Simmer, MD;  Location: [facility];  Service: Orthopedics;  Laterality: Right;    Social History Adam Ray  reports that he has been smoking Cigarettes and Cigars.  He started smoking about 51 years ago. He has a 14.4 pack-year smoking history. He uses smokeless tobacco. He reports that he does not drink alcohol or use illicit drugs.  family history includes Diabetes in his mother; Heart failure in his father; Stroke in his mother.  No Known Allergies     PHYSICAL  EXAMINATION: Vital signs: BP 108/70 mmHg  Pulse 72  Ht 5\' 10"  (1.778 m)  Wt 154 lb (69.854 kg)  BMI 22.10 kg/m2  Constitutional: Thin, generally well-appearing, no acute distress Psychiatric: alert and oriented x3, cooperative Eyes: extraocular movements intact, anicteric, conjunctiva pink Mouth: oral pharynx moist, no lesions Neck: supple without thyromegaly LYMPH: no lymphadenopathy Cardiovascular: heart regular rate and rhythm, no murmur Lungs: clear to auscultation bilaterally Abdomen: soft, nontender, nondistended, no obvious ascites, no peritoneal signs, normal bowel sounds, no organomegaly Rectal: Deferred until colonoscopy Extremities: no clubbing cyanosis or lower extremity edema bilaterally Skin: no lesions on visible extremities Neuro: No focal deficits. No asterixis.   ASSESSMENT:  #1. Change in bowel habits as manifested by progressive constipation #2. Colonoscopy greater than 8 years ago with hyperplastic polyp. No report of the actual procedure #3. Multiple medical problems including insulin requiring diabetes mellitus  PLAN:  #1. MiraLAX for constipation. Titrate to need #2. Schedule colonoscopy to evaluate change in bowel habits and provide follow-up neoplasia screening. Almost 10 years. Higher than baseline risk given insulin requiring diabetes. See below.The nature of the procedure, as well as the risks, benefits, and alternatives were carefully and thoroughly reviewed with the patient. Ample time for discussion and questions allowed. The patient understood, was satisfied, and agreed to proceed. #3. Advised to adjust insulin. He was told to take his evening Lantus at 75 units (instead of 100). Also, hold a.m. NovoLog and metformin. Morning appointment  A copy  of this consultation note has been sent to Dr. Ardelia Ray

## 2015-03-27 NOTE — Patient Instructions (Signed)
You have been scheduled for a colonoscopy. Please follow written instructions given to you at your visit today.  Please pick up your prep supplies at the pharmacy within the next 1-3 days. If you use inhalers (even only as needed), please bring them with you on the day of your procedure.   

## 2015-04-07 ENCOUNTER — Encounter: Payer: Self-pay | Admitting: Internal Medicine

## 2015-04-07 ENCOUNTER — Ambulatory Visit (AMBULATORY_SURGERY_CENTER): Payer: Commercial Managed Care - HMO | Admitting: Internal Medicine

## 2015-04-07 VITALS — BP 119/73 | HR 64 | Temp 96.7°F | Resp 18 | Ht 70.0 in | Wt 164.0 lb

## 2015-04-07 DIAGNOSIS — I739 Peripheral vascular disease, unspecified: Secondary | ICD-10-CM | POA: Diagnosis not present

## 2015-04-07 DIAGNOSIS — J449 Chronic obstructive pulmonary disease, unspecified: Secondary | ICD-10-CM | POA: Diagnosis not present

## 2015-04-07 DIAGNOSIS — I10 Essential (primary) hypertension: Secondary | ICD-10-CM | POA: Diagnosis not present

## 2015-04-07 DIAGNOSIS — Z1211 Encounter for screening for malignant neoplasm of colon: Secondary | ICD-10-CM

## 2015-04-07 DIAGNOSIS — R195 Other fecal abnormalities: Secondary | ICD-10-CM | POA: Diagnosis not present

## 2015-04-07 DIAGNOSIS — R194 Change in bowel habit: Secondary | ICD-10-CM | POA: Diagnosis present

## 2015-04-07 DIAGNOSIS — E119 Type 2 diabetes mellitus without complications: Secondary | ICD-10-CM | POA: Diagnosis not present

## 2015-04-07 LAB — GLUCOSE, CAPILLARY
GLUCOSE-CAPILLARY: 372 mg/dL — AB (ref 65–99)
GLUCOSE-CAPILLARY: 125 mg/dL — AB (ref 65–99)
Glucose-Capillary: 421 mg/dL — ABNORMAL HIGH (ref 65–99)

## 2015-04-07 MED ORDER — INSULIN REGULAR HUMAN 100 UNIT/ML IJ SOLN
10.0000 [IU] | Freq: Once | INTRAMUSCULAR | Status: AC
  Administered 2015-04-07: 10 [IU] via INTRAVENOUS

## 2015-04-07 MED ORDER — INSULIN REGULAR HUMAN 100 UNIT/ML IJ SOLN
10.0000 [IU] | Freq: Once | INTRAMUSCULAR | Status: DC
  Administered 2015-04-07: 10 [IU] via INTRAVENOUS

## 2015-04-07 MED ORDER — SODIUM CHLORIDE 0.9 % IV SOLN: 500.0000 mL | INTRAVENOUS | Status: DC

## 2015-04-07 NOTE — Op Note (Signed)
[place]  [address]. [place], [address]   COLONOSCOPY PROCEDURE REPORT  PATIENT: Adam Ray, Adam Ray  MR#: [id number] BIRTHDATE: 1956/04/09 , 29  yrs. old GENDER: male ENDOSCOPIST: Eustace Quail, MD REFERRED [id number] [name], M.D. PROCEDURE DATE:  04/07/2015 PROCEDURE:   Colonoscopy, screening First Screening Colonoscopy - Avg.  risk and is 50 yrs.  old or older - No.  Prior Negative Screening - Now for repeat screening. N/A  History of Adenoma - Now for follow-up colonoscopy & has been > or = to 3 yrs.  N/A  Polyps removed today? No Recommend repeat exam, <10 yrs? Yes poor prep ASA CLASS:   Class III INDICATIONS:Screening for colonic neoplasia and Colorectal Neoplasm Risk Assessment for this procedure is average risk.   Change in bowel habits with progressive constipation. Had colonoscopy in 2008 with hyperplastic polyp pathology (no procedure report) MEDICATIONS: Monitored anesthesia care and Propofol 150 mg IV  DESCRIPTION OF PROCEDURE:   After the risks benefits and alternatives of the procedure were thoroughly explained, informed consent was obtained.  The digital rectal exam revealed no abnormalities of the rectum.   The LB [id number]  endoscope was introduced through the anus and advanced to the cecum, which was identified by both the appendix and ileocecal valve. No adverse events experienced.   The quality of the prep was poor.  (Suprep was used)  The instrument was then slowly withdrawn as the colon was fully examined. Estimated blood loss is zero unless otherwise noted in this procedure report.      COLON FINDINGS: The exam was complete to the cecum the preparation was poor. No gross colonic lesions.A normal appearing cecum, ileocecal valve, and appendiceal orifice were identified.  The ascending, transverse, descending, sigmoid colon, and rectum appeared unremarkable.  Retroflexed views revealed no abnormalities. The time to cecum =  3.8 Withdrawal time = 4.7   The scope was withdrawn and the procedure completed. COMPLICATIONS: There were no immediate complications.  ENDOSCOPIC IMPRESSION: 1. Normal colonoscopy with great limitation secondary to poor prep  RECOMMENDATIONS: 1. Repeat Colonoscopy in 1 year.  Will need more vigorous preparation (today) with strict attention to dietary restrictions preprocedure. 2. Recommend MiraLAX (GlycoLax) daily for chronic constipation. Can obtain over-the-counter at drug store eSigned:  Eustace Quail, MD 04/07/2015 11:25 AM   cc: The Patient    ; Donalda Ewings.D.

## 2015-04-07 NOTE — Patient Instructions (Signed)
YOU HAD AN ENDOSCOPIC PROCEDURE TODAY AT [facility]:   Refer to the procedure report that was given to you for any specific questions about what was found during the examination.  If the procedure report does not answer your questions, please call your gastroenterologist to clarify.  If you requested that your care partner not be given the details of your procedure findings, then the procedure report has been included in a sealed envelope for you to review at your convenience later.  YOU SHOULD EXPECT: Some feelings of bloating in the abdomen. Passage of more gas than usual.  Walking can help get rid of the air that was put into your GI tract during the procedure and reduce the bloating. If you had a lower endoscopy (such as a colonoscopy or flexible sigmoidoscopy) you may notice spotting of blood in your stool or on the toilet paper. If you underwent a bowel prep for your procedure, you may not have a normal bowel movement for a few days.  Please Note:  You might notice some irritation and congestion in your nose or some drainage.  This is from the oxygen used during your procedure.  There is no need for concern and it should clear up in a day or so.  SYMPTOMS TO REPORT IMMEDIATELY:   Following lower endoscopy (colonoscopy or flexible sigmoidoscopy):  Excessive amounts of blood in the stool  Significant tenderness or worsening of abdominal pains  Swelling of the abdomen that is new, acute  Fever of 100F or higher  For urgent or emergent issues, a gastroenterologist can be reached at any hour by calling [phone].   DIET: Your first meal following the procedure should be a small meal and then it is ok to progress to your normal diet. Heavy or fried foods are harder to digest and may make you feel nauseous or bloated.  Likewise, meals heavy in dairy and vegetables can increase bloating.  Drink plenty of fluids but you should avoid alcoholic beverages for 24  hours.  ACTIVITY:  You should plan to take it easy for the rest of today and you should NOT DRIVE or use heavy machinery until tomorrow (because of the sedation medicines used during the test).    FOLLOW UP: Our staff will call the number listed on your records the next business day following your procedure to check on you and address any questions or concerns that you may have regarding the information given to you following your procedure. If we do not reach you, we will leave a message.  However, if you are feeling well and you are not experiencing any problems, there is no need to return our call.  We will assume that you have returned to your regular daily activities without incident.  If any biopsies were taken you will be contacted by phone or by letter within the next 1-3 weeks.  Please call us at [phone] if you have not heard about the biopsies in 3 weeks.    SIGNATURES/CONFIDENTIALITY: You and/or your care partner have signed paperwork which will be entered into your electronic medical record.  These signatures attest to the fact that that the information above on your After Visit Summary has been reviewed and is understood.  Full responsibility of the confidentiality of this discharge information lies with you and/or your care-partner.  Repeat colonoscopy in 1 years with more vigorous prep and strict dietary restrictions.  Miralax daily for chronic constipation.

## 2015-04-07 NOTE — Progress Notes (Signed)
Report to PACU, RN, vss, BBS= Clear.   During procedure pt noticed to be "chewing"  Head droppped, pt suctioned.  approx 100cc clear fluid out.  Dr Henrene Pastor and PACU RN notified

## 2015-04-07 NOTE — Progress Notes (Signed)
1003 blood glucose 372. Patient stating he is not having any symptoms.

## 2015-04-07 NOTE — Progress Notes (Addendum)
Blood glucose 372 at 1003, informed Dr.Perry. Ordered received for Novolin Regular 10 units IV. Novolin 10 units iv at 1020.

## 2015-04-07 NOTE — Progress Notes (Signed)
Blood sugar at 1035, 274, informed Dr. Henrene Pastor and [facility] Monday CRNA.

## 2015-04-09 ENCOUNTER — Other Ambulatory Visit: Payer: Self-pay | Admitting: Family Medicine

## 2015-04-10 ENCOUNTER — Telehealth: Payer: Self-pay | Admitting: *Deleted

## 2015-04-10 LAB — GLUCOSE, CAPILLARY: GLUCOSE-CAPILLARY: 274 mg/dL — AB (ref 65–99)

## 2015-04-10 NOTE — Telephone Encounter (Signed)
  Follow up Call-  Call back number 04/07/2015  Post procedure Call Back phone  # [phone] - wife cell phone  Permission to leave phone message Yes     No answer and no answering picked up to leave a message.

## 2015-04-12 ENCOUNTER — Other Ambulatory Visit: Payer: Self-pay | Admitting: Family Medicine

## 2015-04-24 DIAGNOSIS — H2511 Age-related nuclear cataract, right eye: Secondary | ICD-10-CM | POA: Diagnosis not present

## 2015-05-03 DIAGNOSIS — E113599 Type 2 diabetes mellitus with proliferative diabetic retinopathy without macular edema, unspecified eye: Secondary | ICD-10-CM | POA: Diagnosis not present

## 2015-05-03 DIAGNOSIS — E119 Type 2 diabetes mellitus without complications: Secondary | ICD-10-CM | POA: Diagnosis not present

## 2015-05-04 ENCOUNTER — Other Ambulatory Visit: Payer: Self-pay | Admitting: Family Medicine

## 2015-05-04 NOTE — Telephone Encounter (Signed)
Patient needs medications sent to [facility] on [address].  He will no longer be using home delivery.  Fluid pill and viagara are needed.

## 2015-05-05 ENCOUNTER — Other Ambulatory Visit: Payer: Self-pay | Admitting: Family Medicine

## 2015-05-08 ENCOUNTER — Encounter: Payer: Self-pay | Admitting: Family Medicine

## 2015-05-08 ENCOUNTER — Ambulatory Visit (INDEPENDENT_AMBULATORY_CARE_PROVIDER_SITE_OTHER): Payer: Commercial Managed Care - HMO | Admitting: Family Medicine

## 2015-05-08 VITALS — BP 123/70 | HR 67 | Temp 98.8°F | Ht 70.0 in | Wt 167.0 lb

## 2015-05-08 DIAGNOSIS — Z9889 Other specified postprocedural states: Secondary | ICD-10-CM | POA: Diagnosis not present

## 2015-05-08 DIAGNOSIS — E1121 Type 2 diabetes mellitus with diabetic nephropathy: Secondary | ICD-10-CM

## 2015-05-08 DIAGNOSIS — IMO0002 Reserved for concepts with insufficient information to code with codable children: Secondary | ICD-10-CM

## 2015-05-08 DIAGNOSIS — E1165 Type 2 diabetes mellitus with hyperglycemia: Secondary | ICD-10-CM | POA: Diagnosis not present

## 2015-05-08 DIAGNOSIS — H547 Unspecified visual loss: Secondary | ICD-10-CM

## 2015-05-08 LAB — POCT GLYCOSYLATED HEMOGLOBIN (HGB A1C): Hemoglobin A1C: 11.8

## 2015-05-08 MED ORDER — FUROSEMIDE 20 MG PO TABS: ORAL_TABLET | ORAL | Status: DC

## 2015-05-08 MED ORDER — SILDENAFIL CITRATE 100 MG PO TABS: ORAL_TABLET | ORAL | Status: DC

## 2015-05-08 NOTE — Progress Notes (Signed)
Date of Visit: 05/08/2015   HPI:  Patient presents to discuss vision problems.  Had eye surgery done last week with Dr. Alanda Slim at [facility] on [address]. Surgery went well but patient reports that beginning about 3-4 days ago started having decreased vision in his eyes. Called the opthalmologist and they scheduled him a visit for tomorrow. He already had a visit scheduled with me, so he kept this appointment today.   ROS: See HPI.  [place]: history of bicuspid aortic valve, hypertension, diabetic retinopathy, uncontrolled type 2 diabetes   PHYSICAL EXAM: BP 123/70 mmHg  Pulse 67  Temp(Src) 98.8 F (37.1 C) (Oral)  Ht 5\' 10"  (1.778 m)  Wt 167 lb (75.751 kg)  BMI 23.96 kg/m2  Visual acuity: L 20/80  R 20/120  B 20/120  Gen: no acute distress, keeps eyes closed, has to blot at them frequently with tissue HEENT: able to identify number of fingers held up in front of him  ASSESSMENT/PLAN:  1. Acute vision issues after eye surgery - explained to patient that he really needs to be seen by his eye doctor for this. I was able to call and get him a same day appointment with the ophtho office. He was able to leave our office and go straight there. Advised that his wife drive, not him, due to the vision issues.  2. diabetes - we did not discuss in any detail due to the more pressing nature of his eye issues. He will follow up with me separately for the diabetes.  FOLLOW UP: Schedule separate visit for diabetes  [name], [place]

## 2015-05-08 NOTE — Patient Instructions (Signed)
Please go to your eye doctor They are expecting you at 3pm You should not drive until they clear you  I will send in your medicines Follow up with me for diabetes another day  Be well, Dr. [name]

## 2015-05-16 DIAGNOSIS — E103512 Type 1 diabetes mellitus with proliferative diabetic retinopathy with macular edema, left eye: Secondary | ICD-10-CM | POA: Diagnosis not present

## 2015-06-12 ENCOUNTER — Ambulatory Visit (INDEPENDENT_AMBULATORY_CARE_PROVIDER_SITE_OTHER): Payer: Commercial Managed Care - HMO | Admitting: Family Medicine

## 2015-06-12 ENCOUNTER — Encounter: Payer: Self-pay | Admitting: Family Medicine

## 2015-06-12 VITALS — BP 141/68 | HR 69 | Temp 98.1°F | Ht 70.0 in | Wt 172.1 lb

## 2015-06-12 DIAGNOSIS — E1121 Type 2 diabetes mellitus with diabetic nephropathy: Secondary | ICD-10-CM | POA: Diagnosis not present

## 2015-06-12 DIAGNOSIS — E1165 Type 2 diabetes mellitus with hyperglycemia: Secondary | ICD-10-CM

## 2015-06-12 DIAGNOSIS — Z23 Encounter for immunization: Secondary | ICD-10-CM

## 2015-06-12 DIAGNOSIS — R911 Solitary pulmonary nodule: Secondary | ICD-10-CM | POA: Diagnosis not present

## 2015-06-12 DIAGNOSIS — Z794 Long term (current) use of insulin: Secondary | ICD-10-CM

## 2015-06-12 DIAGNOSIS — IMO0002 Reserved for concepts with insufficient information to code with codable children: Secondary | ICD-10-CM

## 2015-06-12 LAB — BASIC METABOLIC PANEL
BUN: 15 mg/dL (ref 7–25)
CALCIUM: 8.9 mg/dL (ref 8.6–10.3)
CO2: 29 mmol/L (ref 20–31)
Chloride: 106 mmol/L (ref 98–110)
Creat: 1.05 mg/dL (ref 0.70–1.25)
Glucose, Bld: 233 mg/dL — ABNORMAL HIGH (ref 65–99)
Potassium: 4.7 mmol/L (ref 3.5–5.3)
SODIUM: 141 mmol/L (ref 135–146)

## 2015-06-12 NOTE — Patient Instructions (Signed)
Decrease lantus to 80 units at night Use the sliding scale below:  Check your blood sugar before each meal. Give yourself the following amount of novolog with the meal, based on what your blood sugar is: Blood Sugar Novolog Dose  Under 150 None  150-199 2 units  200-249 4 units  250-299 7 units  300-349 10 units  350-400 12 units  Over 400 Call doctor   We are scheduling you for a CT scan of your lungs. Please keep this appointment Come back and see me in 3-4 weeks with how your blood sugars are doing.  Be well, Dr. [name]

## 2015-06-12 NOTE — Progress Notes (Signed)
Date of Visit: 06/12/2015   HPI:  Patient presents to discuss diabetes and pulmonary nodule.  Currently taking lantus 100 units at night. Also takes novolog 25 units after breakfast. Tends to eat just breakfast and lunch combined, then dinner later in the day.  Brought meter with him to review blood sugars. Sugars have ranged from 48-367, most of which are in 100's and 200's.   Pulmonary nodule - he missed the appointment for follow up ct scan. Would like this rescheduled.   ROS: See HPI.  [place]: diabetes, hypertension, bicuspid aortic valve, diabetic retinopathy, allergic rhinitis  PHYSICAL EXAM: BP 141/68 mmHg  Pulse 69  Temp(Src) 98.1 F (36.7 C) (Oral)  Ht 5\' 10"  (1.778 m)  Wt 172 lb 1.6 oz (78.064 kg)  BMI 24.69 kg/m2 Gen: NAD, pleasant, cooperative HEENT: NCAT Heart: regular rate and rhythm no murmur Lungs: clear to auscultation bilaterally normal work of breathing  Neuro: alert grossly nonfocal Ext: atraumatic  ASSESSMENT/PLAN:  Health maintenance:  -flu vaccine given today -13 valent pneumonia vaccine today  DM (diabetes mellitus), type 2, uncontrolled Wide range of blood sugars. Will stop his standing novolog 25 units with largest meal and instead switch to sliding scale. Reviewed in detail with patient.  Decrease lantus to 80 units while we add novolog three times daily with meals.   Pulmonary nodule Reschedule follow up CT scan BMET today for pre-contrast assessment of renal function   FOLLOW UP: F/u in 3-4 weeks for diabetes   [name], [place]

## 2015-06-13 ENCOUNTER — Encounter: Payer: Self-pay | Admitting: Family Medicine

## 2015-06-15 NOTE — Assessment & Plan Note (Signed)
Reschedule follow up CT scan BMET today for pre-contrast assessment of renal function

## 2015-06-15 NOTE — Assessment & Plan Note (Signed)
Wide range of blood sugars. Will stop his standing novolog 25 units with largest meal and instead switch to sliding scale. Reviewed in detail with patient.  Decrease lantus to 80 units while we add novolog three times daily with meals.

## 2015-06-19 ENCOUNTER — Ambulatory Visit (HOSPITAL_COMMUNITY): Payer: Commercial Managed Care - HMO

## 2015-06-22 ENCOUNTER — Ambulatory Visit (HOSPITAL_COMMUNITY)
Admission: RE | Admit: 2015-06-22 | Discharge: 2015-06-22 | Disposition: A | Discharge status: Home / Self Care | Payer: Commercial Managed Care - HMO | Source: Ambulatory Visit | Attending: Family Medicine | Admitting: Family Medicine

## 2015-06-22 ENCOUNTER — Encounter (HOSPITAL_COMMUNITY): Payer: Self-pay

## 2015-06-22 DIAGNOSIS — J984 Other disorders of lung: Secondary | ICD-10-CM | POA: Diagnosis not present

## 2015-06-22 DIAGNOSIS — J449 Chronic obstructive pulmonary disease, unspecified: Secondary | ICD-10-CM | POA: Insufficient documentation

## 2015-06-22 DIAGNOSIS — R911 Solitary pulmonary nodule: Secondary | ICD-10-CM | POA: Diagnosis not present

## 2015-06-22 MED ORDER — IOHEXOL 300 MG/ML  SOLN
80.0000 mL | Freq: Once | INTRAMUSCULAR | Status: AC | PRN
  Administered 2015-06-22: 80 mL via INTRAVENOUS

## 2015-06-23 ENCOUNTER — Encounter: Payer: Self-pay | Admitting: Family Medicine

## 2015-07-18 ENCOUNTER — Ambulatory Visit (INDEPENDENT_AMBULATORY_CARE_PROVIDER_SITE_OTHER): Payer: Commercial Managed Care - HMO | Admitting: Family Medicine

## 2015-07-18 ENCOUNTER — Encounter: Payer: Self-pay | Admitting: Family Medicine

## 2015-07-18 VITALS — BP 120/70 | HR 68 | Temp 97.9°F | Ht 70.0 in | Wt 170.0 lb

## 2015-07-18 DIAGNOSIS — IMO0002 Reserved for concepts with insufficient information to code with codable children: Secondary | ICD-10-CM

## 2015-07-18 DIAGNOSIS — E1121 Type 2 diabetes mellitus with diabetic nephropathy: Secondary | ICD-10-CM

## 2015-07-18 DIAGNOSIS — L84 Corns and callosities: Secondary | ICD-10-CM | POA: Diagnosis not present

## 2015-07-18 DIAGNOSIS — R609 Edema, unspecified: Secondary | ICD-10-CM

## 2015-07-18 DIAGNOSIS — Z794 Long term (current) use of insulin: Secondary | ICD-10-CM

## 2015-07-18 DIAGNOSIS — E1165 Type 2 diabetes mellitus with hyperglycemia: Secondary | ICD-10-CM

## 2015-07-18 NOTE — Patient Instructions (Signed)
Fill out the blood sugar chart Schedule an appointment with Dr. Valentina Lucks in our clinic here at the [facility] for diabetes and testing of blood circulation in your legs You can likely get your A1c that day as well.  I am referring you to a podiatrist for your foot callous. You will get a phone call to schedule this appointment.   Call the GI doctor about rescheduling your colonoscopy. That number is [phone]  Be well, Dr. [name]

## 2015-07-21 NOTE — Progress Notes (Signed)
Date of Visit: 07/18/2015   HPI:  Presents to follow up on diabetes.  Currently taking lantus 100 units daily. Brought his meter with him. Sugars are fairly variable. Some morning gets down in the 80s, occasionally lower. Reports he has done sliding scale insulin as discussed at last visit, but he also says he is giving himself "20 or 30" units of novolog at times as well. Not due yet for A1c.  Swelling in feet - has swelling in feet, R worse than L. Takes lasix 20-40mg  daily. No calf pain or erythema.Has callous on medial aspect of R foot. Patient reports has not seen podiatry for this ever, does his own wound care. No pain in his feet. Denies shortness of breath.   Wants to reschedule his colonoscopy - needs the number to [facility].  ROS: See HPI.  [place]: history of diabetes, copd, hypertension, diabetic retinopathy, allergic rhinitis, sickle cell trait, venous insufficiency  PHYSICAL EXAM: BP 120/70 mmHg  Pulse 68  Temp(Src) 97.9 F (36.6 C) (Oral)  Ht 5\' 10"  (1.778 m)  Wt 170 lb (77.111 kg)  BMI 24.39 kg/m2  SpO2 99% Gen: NAD, pleasant, cooperative HEENT: NCAT Heart: regular rate and rhythm, no murmur Lungs: clear to auscultation bilaterally, normal work of breathing  Neuro: alert, grossly nonfocal, speech normal Ext: 2+ pitting edema bilateral feet. 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 Large callous on medial aspect of R foot. No drainage or skin breakdown. DP pulses minimally palpable in each foot. 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 No calf erythema, warmth, or tenderness to suggest DVT. No recent echo documented. -With difficulty palpating pulses today, would benefit from ABI's. Will have him schedule appointment in pharmacy clinic with Dr. Valentina Lucks for these.  -Refer to  podiatry for R medial foot callous as it is at risk for ulceration and infection. -Consider echo in future (hold for now in absence of cardiac/respiratory issues)  DM (diabetes mellitus), type 2, uncontrolled History is very difficult to obtain from patient. He will benefit from intensive counseling regarding how to manage his insulin. - made and printed detailed CBG & insulin chart for patient to complete at home - schedule appointment in pharmacy clinic to discuss insulin dosing - likely will be due for A1c at that point.   Colonoscopy - incomplete due to poor prep in Oct 2016, plan was for 1 year follow up after that. Patient requesting the phone # of GI office as he wants to try again sooner. Phone # obtained & given in after visit summary.   Note - also discussed with patient that he received prevnar 13 pneumonia vaccine at last visit, but per guidelines he actually should have waited until age 1 as he does not have any severely immunocompromising condition. Informed patient of this, advised that I doubt any harm will come to him as a result of the early vaccination. Asked that if he receives a bill from [place] or his insurance company for the vaccination, to let us know so we can rectify the situation. Patient understood and was appreciative.  FOLLOW UP: Follow up in pharmacy clinic in next several weeks  [name], [place]

## 2015-07-21 NOTE — Assessment & Plan Note (Signed)
History is very difficult to obtain from patient. He will benefit from intensive counseling regarding how to manage his insulin. - made and printed detailed CBG & insulin chart for patient to complete at home - schedule appointment in pharmacy clinic to discuss insulin dosing - likely will be due for A1c at that point.

## 2015-07-21 NOTE — Assessment & Plan Note (Signed)
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 No calf erythema, warmth, or tenderness to suggest DVT. No recent echo documented. -With difficulty palpating pulses today, would benefit from ABI's. Will have him schedule appointment in pharmacy clinic with Dr. Valentina Lucks for these.  -Refer to podiatry for R medial foot callous as it is at risk for ulceration and infection. -Consider echo in future (hold for now in absence of cardiac/respiratory issues)

## 2015-07-27 ENCOUNTER — Encounter: Payer: Self-pay | Admitting: Pharmacist

## 2015-07-27 ENCOUNTER — Ambulatory Visit (INDEPENDENT_AMBULATORY_CARE_PROVIDER_SITE_OTHER): Payer: Commercial Managed Care - HMO | Admitting: Pharmacist

## 2015-07-27 DIAGNOSIS — E1165 Type 2 diabetes mellitus with hyperglycemia: Secondary | ICD-10-CM

## 2015-07-27 DIAGNOSIS — Z794 Long term (current) use of insulin: Secondary | ICD-10-CM

## 2015-07-27 DIAGNOSIS — IMO0002 Reserved for concepts with insufficient information to code with codable children: Secondary | ICD-10-CM

## 2015-07-27 DIAGNOSIS — J449 Chronic obstructive pulmonary disease, unspecified: Secondary | ICD-10-CM | POA: Diagnosis not present

## 2015-07-27 DIAGNOSIS — I10 Essential (primary) hypertension: Secondary | ICD-10-CM

## 2015-07-27 DIAGNOSIS — E1121 Type 2 diabetes mellitus with diabetic nephropathy: Secondary | ICD-10-CM

## 2015-07-27 LAB — GLUCOSE, CAPILLARY
GLUCOSE-CAPILLARY: 30 mg/dL — AB (ref 65–99)
GLUCOSE-CAPILLARY: 47 mg/dL — AB (ref 65–99)
GLUCOSE-CAPILLARY: 65 mg/dL (ref 65–99)
GLUCOSE-CAPILLARY: 79 mg/dL (ref 65–99)

## 2015-07-27 LAB — POCT GLYCOSYLATED HEMOGLOBIN (HGB A1C): Hemoglobin A1C: 7.9

## 2015-07-27 MED ORDER — CHLORHEXIDINE GLUCONATE 0.12 % MT SOLN: 10.0000 mL | Freq: Four times a day (QID) | OROMUCOSAL | Status: DC

## 2015-07-27 MED ORDER — FUROSEMIDE 20 MG PO TABS: ORAL_TABLET | ORAL | Status: DC

## 2015-07-27 MED ORDER — QUINAPRIL HCL 5 MG PO TABS: 2.5000 mg | ORAL_TABLET | Freq: Every day | ORAL | Status: DC

## 2015-07-27 MED ORDER — GLUCOSE 40 % PO GEL
1.0000 | Freq: Once | ORAL | Status: AC
  Administered 2015-07-27: 37.5 g via ORAL

## 2015-07-27 MED ORDER — ROSUVASTATIN CALCIUM 20 MG PO TABS: 20.0000 mg | ORAL_TABLET | Freq: Every day | ORAL | Status: DC

## 2015-07-27 MED ORDER — ALBUTEROL SULFATE HFA 108 (90 BASE) MCG/ACT IN AERS: 2.0000 | INHALATION_SPRAY | Freq: Four times a day (QID) | RESPIRATORY_TRACT | Status: DC | PRN

## 2015-07-27 MED ORDER — DULAGLUTIDE 0.75 MG/0.5ML ~~LOC~~ SOAJ: 0.7500 mg | SUBCUTANEOUS | Status: DC

## 2015-07-27 NOTE — Patient Instructions (Addendum)
Decrease your Lantus dose to 80 units once a day at night.  Stop taking Novolog with breakfast.  Pick up prescription for Trulicity 0.75mg  weekly.  Bring this medication with you to your next clinic visit.   Start taking metformin 1000mg  twice per day.   Follow up with Dr. Valentina Lucks in one week.  Bring your medications and glucose meter with you to your next visit.

## 2015-07-27 NOTE — Progress Notes (Signed)
Patient ID: Adam Ray, male   DOB: 09-25-1955, 59 y.o.   MRN: [id number] Reviewed.  Agree with Dr. Graylin Shiver documentation and management.

## 2015-07-27 NOTE — Progress Notes (Signed)
S:    Patient arrives in good spirits, ambulating unassisted.  Presents for diabetes follow up.   Patient reports Diabetes was diagnosed in 56.   Patient reports adherence with medications. Current diabetes medications include metformin 1000mg  daily, Lantus 100 units at night, Novolog 25 units with breakfast.    Patient reports hypoglycemic events with associated sweating and blurred vision.  Patient reports feeling blurred vision during visit.  CBG checked and patient's blood glucose was 30mg /dL.  Patient was given 15 grams of glucose gel.  Repeat CBG after 15 minutes 47mg /dL.  Patient was given a second dose of glucose gel.  Repeat CBG after 20 minutes was 65mg /dL then 79mg /dL after 20 more minutes.    Patient reported dietary habits: -Breakfast (8 to 9 AM): chicken tenders and coffee -Lunch (11AM to 12PM): chicken biscuit  -Dinner (4 to 5 PM): greens, salad, fried chicken -Drink: koolaid 2-3 cups daily (1 pack of mix, 1 cup sugar, gallon water)   Patient reported exercise habits: walks around the house and does yard work including gardening   Patient denies nocturia.  Patient denies neuropathy. Patient reports visual changes (blurry vision at times due to hypoglycemia). Patient reports self foot exams.  Patient reports a calloused spot on his right foot that he has been cutting at.  Patient's PCP looked at his foot during visit.  Patient has been referred to podiatrist.     O:  A1c today: 7.9% Lab Results  Component Value Date   HGBA1C 11.8 05/08/2015    Home fasting CBG: <100  2 hour post-prandial/random CBG: 120-150s  A/P: Diabetes diagnosed in 1990 currently improved.  Patient reports hypoglycemic events and is able to verbalize appropriate hypoglycemia management plan.  Patient reports symptomatic hypoglycemia in clinic. CBG corrected to 79mg /dL with 30 grams of glucose gel.  Patient reports adherence with medications; however, patient reports he is currently taking Novolog  1 hour after breakfast.  Will decrease dose of basal insulin Lantus (insulin glargine) to 80 units daily. Discontinued rapid insulin Novolog (insulin aspart).  Increase metformin to 1000mg  BID.  Started Trulicity (dulaglutide) 0.75mg  weekly.  Will have patient pick up prescription this week and start Trulicity at next visit.  Next A1C anticipated in 3 months.  Written patient instructions provided.  Total time in face to face counseling 45 minutes.   Follow up in Pharmacist Clinic Visit in one week.   Patient seen with Phillis Knack, PharmD Candidate, Bennye Alm, PharmD Resident, and Elisabeth Most, PharmD Resident.  Adam Ray

## 2015-07-27 NOTE — Assessment & Plan Note (Signed)
Diabetes diagnosed in 1990 currently improved.  Patient reports hypoglycemic events and is able to verbalize appropriate hypoglycemia management plan.  Patient reports symptomatic hypoglycemia in clinic. CBG corrected to 79mg /dL with 30 grams of glucose gel.  Patient reports adherence with medications; however, patient reports he is currently taking Novolog 1 hour after breakfast.  Will decrease dose of basal insulin Lantus (insulin glargine) to 80 units daily. Discontinued rapid insulin Novolog (insulin aspart).  Increase metformin to 1000mg  BID.  Started Trulicity (dulaglutide) 0.75mg  weekly.  Will have patient pick up prescription this week and start Trulicity at next visit.  Next A1C anticipated in 3 months.

## 2015-08-10 ENCOUNTER — Encounter: Payer: Self-pay | Admitting: Family Medicine

## 2015-08-10 ENCOUNTER — Ambulatory Visit (INDEPENDENT_AMBULATORY_CARE_PROVIDER_SITE_OTHER): Payer: Commercial Managed Care - HMO | Admitting: Family Medicine

## 2015-08-10 VITALS — BP 158/91 | HR 70 | Temp 98.2°F | Wt 168.6 lb

## 2015-08-10 DIAGNOSIS — M25512 Pain in left shoulder: Secondary | ICD-10-CM | POA: Insufficient documentation

## 2015-08-10 MED ORDER — NAPROXEN 500 MG PO TABS: 500.0000 mg | ORAL_TABLET | Freq: Two times a day (BID) | ORAL | Status: DC

## 2015-08-10 NOTE — Patient Instructions (Signed)
Thank you for coming in,   I have sent in Naproxen for your pain. Please take this for two weeks.   Please try to perform the exercises that I have given you.   You should not have pain when doing the exercises.   Please follow up in 4 weeks if you don't have any improvement.   Please bring all of your medications with you to each visit.   Sign up for My Chart to have easy access to your labs results, and communication with your Primary care physician   Please feel free to call with any questions or concerns at any time, at [phone]. --Dr. Colman Cater Bandage and RICE WHAT DOES AN ELASTIC BANDAGE DO? Elastic bandages come in different shapes and sizes. They generally provide support to your injury and reduce swelling while you are healing, but they can perform different functions. Your health care provider will help you to decide what is best for your protection, recovery, or rehabilitation following an injury. WHAT ARE SOME GENERAL TIPS FOR USING AN ELASTIC BANDAGE?  Use the bandage as directed by the maker of the bandage that you are using.  Do not wrap the bandage too tightly. This may cut off the circulation in the arm or leg in the area below the bandage.  If part of your body beyond the bandage becomes blue, numb, cold, swollen, or is more painful, your bandage is most likely too tight. If this occurs, remove your bandage and reapply it more loosely.  See your health care provider if the bandage seems to be making your problems worse rather than better.  An elastic bandage should be removed and reapplied every 3-4 hours or as directed by your health care provider. WHAT IS RICE? The routine care of many injuries includes rest, ice, compression, and elevation (RICE therapy).  Rest Rest is required to allow your body to heal. Generally, you can resume your routine activities when you are comfortable and have been given permission by your health care provider. Ice Icing your  injury helps to keep the swelling down and it reduces pain. Do not apply ice directly to your skin.  Put ice in a plastic bag.  Place a towel between your skin and the bag.  Leave the ice on for 20 minutes, 2-3 times per day. Do this for as long as you are directed by your health care provider. Compression Compression helps to keep swelling down, gives support, and helps with discomfort. Compression may be done with an elastic bandage. Elevation Elevation helps to reduce swelling and it decreases pain. If possible, your injured area should be placed at or above the level of your heart or the center of your chest. [place]? You should seek medical care if:  You have persistent pain and swelling.  Your symptoms are getting worse rather than improving. These symptoms may indicate that further evaluation or further X-rays are needed. Sometimes, X-rays may not show a small broken bone (fracture) until a number of days later. Make a follow-up appointment with your health care provider. Ask when your X-ray results will be ready. Make sure that you get your X-ray results. WHEN SHOULD I SEEK IMMEDIATE MEDICAL CARE? You should seek immediate medical care if:  You have a sudden onset of severe pain at or below the area of your injury.  You develop redness or increased swelling around your injury.  You have tingling or numbness at or below the area of  your injury that does not improve after you remove the elastic bandage.   This information is not intended to replace advice given to you by your health care provider. Make sure you discuss any questions you have with your health care provider.   Document Released: 12/07/2001 Document Revised: 03/08/2015 Document Reviewed: 01/31/2014 Elsevier Interactive Patient Education [employer].
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
## 2015-08-10 NOTE — Assessment & Plan Note (Signed)
Most likely RTC pathology.  Doubtful for BT  His imaging of his left shoulder from 2016 looks good with minimal arthritic changes  - naproxen for 14 days  - RICE  - given home modalities sheet  - if no improvement then advised to follow up for consideration of injection vs referral to [facility] for nitro and [place] evaluation vs referral to PT

## 2015-08-11 ENCOUNTER — Emergency Department (INDEPENDENT_AMBULATORY_CARE_PROVIDER_SITE_OTHER)
Admission: EM | Admit: 2015-08-11 | Discharge: 2015-08-11 | Disposition: A | Discharge status: Home / Self Care | Payer: Commercial Managed Care - HMO | Source: Home / Self Care | Attending: Emergency Medicine | Admitting: Emergency Medicine

## 2015-08-11 ENCOUNTER — Emergency Department (INDEPENDENT_AMBULATORY_CARE_PROVIDER_SITE_OTHER): Payer: Commercial Managed Care - HMO

## 2015-08-11 ENCOUNTER — Encounter (HOSPITAL_COMMUNITY): Payer: Self-pay

## 2015-08-11 DIAGNOSIS — S46002A Unspecified injury of muscle(s) and tendon(s) of the rotator cuff of left shoulder, initial encounter: Secondary | ICD-10-CM

## 2015-08-11 DIAGNOSIS — M25512 Pain in left shoulder: Secondary | ICD-10-CM | POA: Diagnosis not present

## 2015-08-11 MED ORDER — HYDROCODONE-ACETAMINOPHEN 5-325 MG PO TABS: 1.0000 | ORAL_TABLET | Freq: Four times a day (QID) | ORAL | Status: DC | PRN

## 2015-08-11 MED ORDER — MELOXICAM 7.5 MG PO TABS: 7.5000 mg | ORAL_TABLET | Freq: Every day | ORAL | Status: DC

## 2015-08-11 NOTE — Discharge Instructions (Signed)
You have injured the rotator cuff in your shoulder. Take meloxicam daily for the next week, then as needed. Do not take ibuprofen with this medicine. Apply ice at least 3 times a day. You can use the hydrocodone every 4-6 hours as needed for pain. Do not drive while taking this medicine. Follow-up with sports medicine center next week.

## 2015-08-11 NOTE — ED Provider Notes (Signed)
CSN: [id number]     Arrival date & time 08/11/15  1408 History   First MD Initiated Contact with Patient 08/11/15 1518     Chief Complaint  Patient presents with  . Arm Pain   (Consider location/radiation/quality/duration/timing/severity/associated sxs/prior Treatment) HPI He is a 60 year old man here for evaluation of left shoulder pain. He states that 5 days ago he was working on his shed and was lowering the garage door when he felt a pop in his left anterior shoulder. He had immediate pain, primarily in the anterior shoulder. It radiates down his arm to his elbow. Pain is worse with shoulder abduction and elbow flexion. He has been taking ibuprofen without improvement.  Past Medical History  Diagnosis Date  . Diabetes mellitus   . COPD (chronic obstructive pulmonary disease) ([place])   . Allergy   . Chronic kidney disease   . Bronchitis   . Gout   . Claudication [facility])     right foot ray resection  . Genital warts   . Colon polyps     hyperplastic  . Arthritis   . Cataract    Past Surgical History  Procedure Laterality Date  . Surgery left great toe    . Tear ducts bilateral eyes    . I&d extremity  04/11/2012    Procedure: IRRIGATION AND DEBRIDEMENT EXTREMITY;  Surgeon: Wylene Simmer, MD;  Location: [facility];  Service: Orthopedics;  Laterality: Right;   Family History  Problem Relation Age of Onset  . Diabetes Mother   . Stroke Mother   . Heart failure Father    Social History  Substance Use Topics  . Smoking status: Current Every Day Smoker -- 0.30 packs/day for 48 years    Types: Cigars    Start date: 07/02/1963  . Smokeless tobacco: Current User     Comment: wants to use electric cigarettes.  not interested in pills, worried about chantix side effects  . Alcohol Use: No    Review of Systems As in history of present illness Allergies  Review of patient's allergies indicates no known allergies.  Home Medications   Prior to Admission medications   Medication Sig  Start Date End Date Taking? Authorizing Provider  ACCU-CHEK FASTCLIX LANCETS [place]  12/30/14   Historical Provider, MD  ACCU-CHEK SMARTVIEW test strip  12/30/14   Historical Provider, MD  albuterol (PROVENTIL HFA;VENTOLIN HFA) 108 (90 Base) MCG/ACT inhaler Inhale 2 puffs into the lungs every 6 (six) hours as needed for wheezing or shortness of breath. 07/27/15   [name], MD  Alcohol Swabs (B-D SINGLE USE SWABS REGULAR) PADS  12/30/14   Historical Provider, MD  aspirin EC 81 MG tablet Take 81 mg by mouth daily.    Historical Provider, MD  BD INSULIN SYRINGE ULTRAFINE 31G X 5/16" 0.3 ML MISC  12/30/14   Historical Provider, MD  Blood Glucose Calibration (ACCU-CHEK SMARTVIEW CONTROL) LIQD  12/30/14   Historical Provider, MD  Blood Glucose Monitoring Suppl (ACCU-CHEK NANO SMARTVIEW) W/DEVICE KIT  12/30/14   Historical Provider, MD  chlorhexidine (PERIDEX) 0.12 % solution Use as directed 10 mLs in the mouth or throat 4 (four) times daily. 07/27/15   [name], MD  Dulaglutide (TRULICITY) 1.82 XH/3.7JI SOPN Inject 0.75 mg into the skin once a week. 07/27/15   [name], MD  furosemide (LASIX) 20 MG tablet TAKE 1 TABLET BY MOUTH EVERY DAY MAY TAKE UP TO 2 PER DAY 07/27/15   [name], MD  HYDROcodone-acetaminophen [facility])  5-325 MG tablet Take 1 tablet by mouth every 6 (six) hours as needed for moderate pain. 08/11/15   Charm Rings, MD  ibuprofen (ADVIL,MOTRIN) 200 MG tablet Take 400 mg by mouth every 6 (six) hours as needed for moderate pain.     Historical Provider, MD  imiquimod [facility]) 5 % cream Reported on 07/27/2015 12/29/14   Historical Provider, MD  insulin glargine (LANTUS) 100 UNIT/ML injection Inject 0.8 mLs (80 Units total) into the skin at bedtime. 07/27/15   [place] Manners, MD  meloxicam (MOBIC) 7.5 MG tablet Take 1 tablet (7.5 mg total) by mouth daily. 08/11/15   Charm Rings, MD  metFORMIN (GLUCOPHAGE) 1000 MG tablet Take 1 tablet (1,000 mg total) by mouth 2 (two) times daily  with a meal. Patient taking differently: Take 1,000 mg by mouth daily.  01/10/15   Latrelle Dodrill, MD  methocarbamol (ROBAXIN) 750 MG tablet Take 1 tablet (750 mg total) by mouth every 6 (six) hours as needed for muscle spasms (and pain). Patient not taking: Reported on 08/10/2015 11/08/14   Trixie Dredge, PA-C  naproxen (NAPROSYN) 500 MG tablet Take 1 tablet (500 mg total) by mouth 2 (two) times daily with a meal. 08/10/15   Myra Rude, MD  quinapril (ACCUPRIL) 5 MG tablet Take 0.5 tablets (2.5 mg total) by mouth daily. 07/27/15   [place] Manners, MD  rosuvastatin (CRESTOR) 20 MG tablet Take 1 tablet (20 mg total) by mouth daily. 07/27/15   [place] Manners, MD  sildenafil (VIAGRA) 100 MG tablet TAKE 1 TABLET 1 HOUR BEFORE SEXUAL RELATIONS 05/08/15   Latrelle Dodrill, MD   Meds Ordered and Administered this Visit  Medications - No data to display  BP 129/75 mmHg  Pulse 69  Temp(Src) 98.2 F (36.8 C) (Oral)  Resp 16  SpO2 100% No data found.   Physical Exam  Constitutional: He is oriented to person, place, and time. He appears well-developed and well-nourished. No distress.  Cardiovascular: Normal rate.   Pulmonary/Chest: Effort normal.  Musculoskeletal:  Left shoulder: No erythema or edema. No Popeye deformity. No bony tenderness. He is exquisitely tender over the bicipital groove. He has pain with biceps testing. I'm unable to abduct his arm beyond 75 due to pain. He is unable to hold his arm in 75 of abduction. 2+ radial pulse.  Neurological: He is alert and oriented to person, place, and time.    ED Course  Procedures (including critical care time)  Labs Review Labs Reviewed - No data to display  Imaging Review Dg Shoulder Left  08/11/2015  CLINICAL DATA:  Left shoulder pain for 5 days.  No known injury. EXAM: LEFT SHOULDER - 2+ VIEW COMPARISON:  Radiographs 11/18/2014 FINDINGS: There is no acute fracture or dislocation. Slight osteoarthritis of the glenohumeral joint.  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 Multiple old healed left rib fractures. IMPRESSION: No acute abnormality. 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 Os acromiale can predispose to impingement. Electronically Signed   By: Francene Boyers M.D.   On: 08/11/2015 16:08      MDM   1. Rotator cuff injury, left, initial encounter    Suspect rotator cuff injury. He could also have partial tear of biceps tendon. We'll switch him to meloxicam daily. Emphasized importance of icing. Hydrocodone as needed for pain. Follow-up with  sports medicine next week.    Melony Overly, MD 08/11/15 [phone]

## 2015-08-11 NOTE — ED Notes (Signed)
Pt stated that he was pulling down his garage door on Sunday and heard a pop and that his left arm has been in pain since. Pt alert and oriented.

## 2015-08-14 ENCOUNTER — Ambulatory Visit: Payer: Commercial Managed Care - HMO | Admitting: Family Medicine

## 2015-09-15 ENCOUNTER — Ambulatory Visit (INDEPENDENT_AMBULATORY_CARE_PROVIDER_SITE_OTHER): Payer: Commercial Managed Care - HMO | Admitting: Family Medicine

## 2015-09-15 ENCOUNTER — Encounter: Payer: Self-pay | Admitting: Family Medicine

## 2015-09-15 VITALS — BP 140/74 | HR 71 | Temp 98.2°F | Wt 171.0 lb

## 2015-09-15 DIAGNOSIS — E119 Type 2 diabetes mellitus without complications: Secondary | ICD-10-CM

## 2015-09-15 DIAGNOSIS — Z23 Encounter for immunization: Secondary | ICD-10-CM

## 2015-09-15 DIAGNOSIS — M25512 Pain in left shoulder: Secondary | ICD-10-CM | POA: Diagnosis not present

## 2015-09-15 DIAGNOSIS — E1121 Type 2 diabetes mellitus with diabetic nephropathy: Secondary | ICD-10-CM

## 2015-09-15 DIAGNOSIS — IMO0002 Reserved for concepts with insufficient information to code with codable children: Secondary | ICD-10-CM

## 2015-09-15 DIAGNOSIS — L84 Corns and callosities: Secondary | ICD-10-CM

## 2015-09-15 DIAGNOSIS — Z794 Long term (current) use of insulin: Secondary | ICD-10-CM

## 2015-09-15 DIAGNOSIS — E1165 Type 2 diabetes mellitus with hyperglycemia: Secondary | ICD-10-CM

## 2015-09-15 MED ORDER — ZOSTER VACCINE LIVE 19400 UNT/0.65ML ~~LOC~~ SOLR: 0.6500 mL | Freq: Once | SUBCUTANEOUS | Status: DC

## 2015-09-15 NOTE — Progress Notes (Signed)
Date of Visit: 09/15/2015   HPI:  Diabetes - sugars have overall been over 200 for the last 3 days. Taking lantus 100 units at night. Sugar was 298 this morning. Did not bring blood sugar log with him today. Sugars are often running 60s-70s in the morning. Denies chest pain or shortness of breath.   Shoulder pain - having pain in left shoulder. Had xrays at urgent care on 2/10 showing degenerative glenohumeral changes. Pain with motion of shoulder. willing to see sports medicine.   Feet issues- has big callous on foot. Hasn't heard about podiatry appointment.   ROS: See HPI.  [place]: hypertension, diabetes, bicuspid aortic valve, diabetic retinopathy, venous insufficiency  PHYSICAL EXAM: BP 140/74 mmHg  Pulse 71  Temp(Src) 98.2 F (36.8 C) (Oral)  Wt 171 lb (77.565 kg) Gen: NAD pleasant cooperative HEENT: normocephalic, atraumatic  Heart: regular rate and rhythm, no murmur Lungs: clear to auscultation bilaterally normal work of breathing  Neuro: grossly nonfocal, speech normal Ext: mild edema bilateral lower shins L foot: Large bunion present over medial aspect of first MTP joint. Hammertoes present. 1+ DP pulse. Diffuse skin changes of dryness. Decreased monofilament sensation.  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 Large callous present on lateral aspect of plantar foot. No ulcerations. Dry skin throughout. Normal monofilament sensation. Pulse not palpable easily but foot warm and well perfused.  Toenail hypertrophy bilaterally  ASSESSMENT/PLAN:  DM (diabetes mellitus), type 2, uncontrolled Uncontrolled at present, with somewhat labile sugars Change lantus dosing to morning to avoid low fastings Refer to podiatry again (apparently I never entered referral before?) Follow up in 2 weeks to see how he's doing Printed new blood sugar chart for patient to fill out  Left shoulder pain Suspect rotator cuff impingement. refer to sports medicine for further eval.    FOLLOW  UP: Follow up in 2 weeks for diabetes  Referring to sports medicine Referring to podiatry  [name], [place]

## 2015-09-15 NOTE — Patient Instructions (Signed)
Switch to taking lantus in morning Referring to podiatrist Checking liver, kidneys, cholesterol today  Follow up with me in 2 weeks Fill out blood sugar chart  Be well, Dr. [name]

## 2015-09-16 LAB — COMPREHENSIVE METABOLIC PANEL
ALBUMIN: 4.2 g/dL (ref 3.6–5.1)
ALT: 23 U/L (ref 9–46)
AST: 21 U/L (ref 10–35)
Alkaline Phosphatase: 109 U/L (ref 40–115)
BUN: 13 mg/dL (ref 7–25)
CHLORIDE: 102 mmol/L (ref 98–110)
CO2: 29 mmol/L (ref 20–31)
CREATININE: 1.04 mg/dL (ref 0.70–1.25)
Calcium: 9.3 mg/dL (ref 8.6–10.3)
Glucose, Bld: 95 mg/dL (ref 65–99)
POTASSIUM: 4.1 mmol/L (ref 3.5–5.3)
SODIUM: 139 mmol/L (ref 135–146)
Total Bilirubin: 0.4 mg/dL (ref 0.2–1.2)
Total Protein: 6.6 g/dL (ref 6.1–8.1)

## 2015-09-16 LAB — LIPID PANEL
CHOL/HDL RATIO: 3.2 ratio (ref <=5.0)
CHOLESTEROL: 137 mg/dL (ref 125–200)
HDL: 43 mg/dL (ref >=40)
LDL CALC: 66 mg/dL (ref <130)
TRIGLYCERIDES: 140 mg/dL (ref <150)
VLDL: 28 mg/dL (ref <30)

## 2015-09-20 NOTE — Assessment & Plan Note (Addendum)
Uncontrolled at present, with somewhat labile sugars Change lantus dosing to morning to avoid low fastings Refer to podiatry again (apparently I never entered referral before?) Follow up in 2 weeks to see how he's doing Printed new blood sugar chart for patient to fill out

## 2015-09-20 NOTE — Assessment & Plan Note (Signed)
Suspect rotator cuff impingement. refer to sports medicine for further eval.

## 2015-09-27 ENCOUNTER — Encounter: Payer: Self-pay | Admitting: Family Medicine

## 2015-10-02 ENCOUNTER — Encounter: Payer: Self-pay | Admitting: Family Medicine

## 2015-10-02 ENCOUNTER — Ambulatory Visit (INDEPENDENT_AMBULATORY_CARE_PROVIDER_SITE_OTHER): Payer: Commercial Managed Care - HMO | Admitting: Family Medicine

## 2015-10-02 VITALS — BP 130/74 | HR 81 | Temp 98.1°F | Ht 70.0 in | Wt 165.0 lb

## 2015-10-02 DIAGNOSIS — Z794 Long term (current) use of insulin: Secondary | ICD-10-CM

## 2015-10-02 DIAGNOSIS — E1121 Type 2 diabetes mellitus with diabetic nephropathy: Secondary | ICD-10-CM

## 2015-10-02 DIAGNOSIS — IMO0002 Reserved for concepts with insufficient information to code with codable children: Secondary | ICD-10-CM

## 2015-10-02 DIAGNOSIS — E1165 Type 2 diabetes mellitus with hyperglycemia: Secondary | ICD-10-CM | POA: Diagnosis not present

## 2015-10-02 DIAGNOSIS — M25512 Pain in left shoulder: Secondary | ICD-10-CM | POA: Diagnosis not present

## 2015-10-02 MED ORDER — ACETAMINOPHEN 500 MG PO TABS
1000.0000 mg | ORAL_TABLET | Freq: Three times a day (TID) | ORAL | Status: DC | PRN
Start: 2015-10-02 — End: 2018-06-30

## 2015-10-02 NOTE — Patient Instructions (Signed)
Fill out blood sugar chart Do not take more than 30 units of novolog at one time - it is very dangerous to take more than that  Follow up with me in 2-3 weeks with the blood sugar chart  For shoulder, see handout with exercises Try tylenol - sent this in for you  Be well, Dr. [name]   Impingement Syndrome, Rotator Cuff, Bursitis With Rehab Impingement syndrome is a condition that involves inflammation of the tendons of the rotator cuff and the subacromial bursa, that causes pain in the shoulder. The rotator cuff consists of four tendons and muscles that control much of the shoulder and upper arm function. The subacromial bursa is a fluid filled sac that helps reduce friction between the rotator cuff and one of the bones of the shoulder (acromion). Impingement syndrome is usually an overuse injury that causes swelling of the bursa (bursitis), swelling of the tendon (tendonitis), and/or a tear of the tendon (strain). Strains are classified into three categories. Grade 1 strains cause pain, but the tendon is not lengthened. Grade 2 strains include a lengthened ligament, due to the ligament being stretched or partially ruptured. With grade 2 strains there is still function, although the function may be decreased. Grade 3 strains include a complete tear of the tendon or muscle, and function is usually impaired. SYMPTOMS   Pain around the shoulder, often at the outer portion of the upper arm.  Pain that gets worse with shoulder function, especially when reaching overhead or lifting.  Sometimes, aching when not using the arm.  Pain that wakes you up at night.  Sometimes, tenderness, swelling, warmth, or redness over the affected area.  Loss of strength.  Limited motion of the shoulder, especially reaching behind the back (to the back pocket or to unhook bra) or across your body.  Crackling sound (crepitation) when moving the arm.  Biceps tendon pain and inflammation (in the front of the  shoulder). Worse when bending the elbow or lifting. CAUSES  Impingement syndrome is often an overuse injury, in which chronic (repetitive) motions cause the tendons or bursa to become inflamed. A strain occurs when a force is paced on the tendon or muscle that is greater than it can withstand. Common mechanisms of injury include: Stress from sudden increase in duration, frequency, or intensity of training.  Direct hit (trauma) to the shoulder.  Aging, erosion of the tendon with normal use.  Bony bump on shoulder (acromial spur). RISK INCREASES WITH:  Contact sports (football, wrestling, boxing).  Throwing sports (baseball, tennis, volleyball).  Weightlifting and bodybuilding.  Heavy labor.  Previous injury to the rotator cuff, including impingement.  Poor shoulder strength and flexibility.  Failure to warm up properly before activity.  Inadequate protective equipment.  Old age.  Bony bump on shoulder (acromial spur). PREVENTION   Warm up and stretch properly before activity.  Allow for adequate recovery between workouts.  Maintain physical fitness:  Strength, flexibility, and endurance.  Cardiovascular fitness.  Learn and use proper exercise technique. PROGNOSIS  If treated properly, impingement syndrome usually goes away within 6 weeks. Sometimes surgery is required.  RELATED COMPLICATIONS   Longer healing time if not properly treated, or if not given enough time to heal.  Recurring symptoms, that result in a chronic condition.  Shoulder stiffness, frozen shoulder, or loss of motion.  Rotator cuff tendon tear.  Recurring symptoms, especially if activity is resumed too soon, with overuse, with a direct blow, or when using poor technique. TREATMENT  Treatment  first involves the use of ice and medicine, to reduce pain and inflammation. The use of strengthening and stretching exercises may help reduce pain with activity. These exercises may be performed at home  or with a therapist. If non-surgical treatment is unsuccessful after more than 6 months, surgery may be advised. After surgery and rehabilitation, activity is usually possible in 3 months.  MEDICATION  If pain medicine is needed, nonsteroidal anti-inflammatory medicines (aspirin and ibuprofen), or other minor pain relievers (acetaminophen), are often advised.  Do not take pain medicine for 7 days before surgery.  Prescription pain relievers may be given, if your caregiver thinks they are needed. Use only as directed and only as much as you need.  Corticosteroid injections may be given by your caregiver. These injections should be reserved for the most serious cases, because they may only be given a certain number of times. HEAT AND COLD  Cold treatment (icing) should be applied for 10 to 15 minutes every 2 to 3 hours for inflammation and pain, and immediately after activity that aggravates your symptoms. Use ice packs or an ice massage.  Heat treatment may be used before performing stretching and strengthening activities prescribed by your caregiver, physical therapist, or athletic trainer. Use a heat pack or a warm water soak. SEEK MEDICAL CARE IF:   Symptoms get worse or do not improve in 4 to 6 weeks, despite treatment.  New, unexplained symptoms develop. (Drugs used in treatment may produce side effects.) EXERCISES  RANGE OF MOTION (ROM) AND STRETCHING EXERCISES - Impingement Syndrome (Rotator Cuff  Tendinitis, Bursitis) These exercises may help you when beginning to rehabilitate your injury. Your symptoms may go away with or without further involvement from your physician, physical therapist or athletic trainer. While completing these exercises, remember:   Restoring tissue flexibility helps normal motion to return to the joints. This allows healthier, less painful movement and activity.  An effective stretch should be held for at least 30 seconds.  A stretch should never be  painful. You should only feel a gentle lengthening or release in the stretched tissue. STRETCH - Flexion, Standing  Stand with good posture. With an underhand grip on your right / left hand, and an overhand grip on the opposite hand, grasp a broomstick or cane so that your hands are a little more than shoulder width apart.  Keeping your right / left elbow straight and shoulder muscles relaxed, push the stick with your opposite hand, to raise your right / left arm in front of your body and then overhead. Raise your arm until you feel a stretch in your right / left shoulder, but before you have increased shoulder pain.  Try to avoid shrugging your right / left shoulder as your arm rises, by keeping your shoulder blade tucked down and toward your mid-back spine. Hold for __________ seconds.  Slowly return to the starting position. Repeat __________ times. Complete this exercise __________ times per day. STRETCH - Abduction, Supine  Lie on your back. With an underhand grip on your right / left hand and an overhand grip on the opposite hand, grasp a broomstick or cane so that your hands are a little more than shoulder width apart.  Keeping your right / left elbow straight and your shoulder muscles relaxed, push the stick with your opposite hand, to raise your right / left arm out to the side of your body and then overhead. Raise your arm until you feel a stretch in your right / left shoulder,  but before you have increased shoulder pain.  Try to avoid shrugging your right / left shoulder as your arm rises, by keeping your shoulder blade tucked down and toward your mid-back spine. Hold for __________ seconds.  Slowly return to the starting position. Repeat __________ times. Complete this exercise __________ times per day. ROM - Flexion, Active-Assisted  Lie on your back. You may bend your knees for comfort.  Grasp a broomstick or cane so your hands are about shoulder width apart. Your right / left  hand should grip the end of the stick, so that your hand is positioned "thumbs-up," as if you were about to shake hands.  Using your healthy arm to lead, raise your right / left arm overhead, until you feel a gentle stretch in your shoulder. Hold for __________ seconds.  Use the stick to assist in returning your right / left arm to its starting position. Repeat __________ times. Complete this exercise __________ times per day.  ROM - Internal Rotation, Supine   Lie on your back on a firm surface. Place your right / left elbow about 60 degrees away from your side. Elevate your elbow with a folded towel, so that the elbow and shoulder are the same height.  Using a broomstick or cane and your strong arm, pull your right / left hand toward your body until you feel a gentle stretch, but no increase in your shoulder pain. Keep your shoulder and elbow in place throughout the exercise.  Hold for __________ seconds. Slowly return to the starting position. Repeat __________ times. Complete this exercise __________ times per day. STRETCH - Internal Rotation  Place your right / left hand behind your back, palm up.  Throw a towel or belt over your opposite shoulder. Grasp the towel with your right / left hand.  While keeping an upright posture, gently pull up on the towel, until you feel a stretch in the front of your right / left shoulder.  Avoid shrugging your right / left shoulder as your arm rises, by keeping your shoulder blade tucked down and toward your mid-back spine.  Hold for __________ seconds. Release the stretch, by lowering your healthy hand. Repeat __________ times. Complete this exercise __________ times per day. ROM - Internal Rotation   Using an underhand grip, grasp a stick behind your back with both hands.  While standing upright with good posture, slide the stick up your back until you feel a mild stretch in the front of your shoulder.  Hold for __________ seconds. Slowly  return to your starting position. Repeat __________ times. Complete this exercise __________ times per day.  STRETCH - Posterior Shoulder Capsule   Stand or sit with good posture. Grasp your right / left elbow and draw it across your chest, keeping it at the same height as your shoulder.  Pull your elbow, so your upper arm comes in closer to your chest. Pull until you feel a gentle stretch in the back of your shoulder.  Hold for __________ seconds. Repeat __________ times. Complete this exercise __________ times per day. STRENGTHENING EXERCISES - Impingement Syndrome (Rotator Cuff Tendinitis, Bursitis) These exercises may help you when beginning to rehabilitate your injury. They may resolve your symptoms with or without further involvement from your physician, physical therapist or athletic trainer. While completing these exercises, remember:  Muscles can gain both the endurance and the strength needed for everyday activities through controlled exercises.  Complete these exercises as instructed by your physician, physical therapist or athletic trainer.  Increase the resistance and repetitions only as guided.  You may experience muscle soreness or fatigue, but the pain or discomfort you are trying to eliminate should never worsen during these exercises. If this pain does get worse, stop and make sure you are following the directions exactly. If the pain is still present after adjustments, discontinue the exercise until you can discuss the trouble with your clinician.  During your recovery, avoid activity or exercises which involve actions that place your injured hand or elbow above your head or behind your back or head. These positions stress the tissues which you are trying to heal. STRENGTH - Scapular Depression and Adduction   With good posture, sit on a firm chair. Support your arms in front of you, with pillows, arm rests, or on a table top. Have your elbows in line with the sides of your  body.  Gently draw your shoulder blades down and toward your mid-back spine. Gradually increase the tension, without tensing the muscles along the top of your shoulders and the back of your neck.  Hold for __________ seconds. Slowly release the tension and relax your muscles completely before starting the next repetition.  After you have practiced this exercise, remove the arm support and complete the exercise in standing as well as sitting position. Repeat __________ times. Complete this exercise __________ times per day.  STRENGTH - Shoulder Abductors, Isometric  With good posture, stand or sit about 4-6 inches from a wall, with your right / left side facing the wall.  Bend your right / left elbow. Gently press your right / left elbow into the wall. Increase the pressure gradually, until you are pressing as hard as you can, without shrugging your shoulder or increasing any shoulder discomfort.  Hold for __________ seconds.  Release the tension slowly. Relax your shoulder muscles completely before you begin the next repetition. Repeat __________ times. Complete this exercise __________ times per day.  STRENGTH - External Rotators, Isometric  Keep your right / left elbow at your side and bend it 90 degrees.  Step into a door frame so that the outside of your right / left wrist can press against the door frame without your upper arm leaving your side.  Gently press your right / left wrist into the door frame, as if you were trying to swing the back of your hand away from your stomach. Gradually increase the tension, until you are pressing as hard as you can, without shrugging your shoulder or increasing any shoulder discomfort.  Hold for __________ seconds.  Release the tension slowly. Relax your shoulder muscles completely before you begin the next repetition. Repeat __________ times. Complete this exercise __________ times per day.  STRENGTH - Supraspinatus   Stand or sit with good  posture. Grasp a __________ weight, or an exercise band or tubing, so that your hand is "thumbs-up," like you are shaking hands.  Slowly lift your right / left arm in a "V" away from your thigh, diagonally into the space between your side and straight ahead. Lift your hand to shoulder height or as far as you can, without increasing any shoulder pain. At first, many people do not lift their hands above shoulder height.  Avoid shrugging your right / left shoulder as your arm rises, by keeping your shoulder blade tucked down and toward your mid-back spine.  Hold for __________ seconds. Control the descent of your hand, as you slowly return to your starting position. Repeat __________ times. Complete this exercise __________  times per day.  STRENGTH - External Rotators  Secure a rubber exercise band or tubing to a fixed object (table, pole) so that it is at the same height as your right / left elbow when you are standing or sitting on a firm surface.  Stand or sit so that the secured exercise band is at your uninjured side.  Bend your right / left elbow 90 degrees. Place a folded towel or small pillow under your right / left arm, so that your elbow is a few inches away from your side.  Keeping the tension on the exercise band, pull it away from your body, as if pivoting on your elbow. Be sure to keep your body steady, so that the movement is coming only from your rotating shoulder.  Hold for __________ seconds. Release the tension in a controlled manner, as you return to the starting position. Repeat __________ times. Complete this exercise __________ times per day.  STRENGTH - Internal Rotators   Secure a rubber exercise band or tubing to a fixed object (table, pole) so that it is at the same height as your right / left elbow when you are standing or sitting on a firm surface.  Stand or sit so that the secured exercise band is at your right / left side.  Bend your elbow 90 degrees. Place a  folded towel or small pillow under your right / left arm so that your elbow is a few inches away from your side.  Keeping the tension on the exercise band, pull it across your body, toward your stomach. Be sure to keep your body steady, so that the movement is coming only from your rotating shoulder.  Hold for __________ seconds. Release the tension in a controlled manner, as you return to the starting position. Repeat __________ times. Complete this exercise __________ times per day.  STRENGTH - Scapular Protractors, Standing   Stand arms length away from a wall. Place your hands on the wall, keeping your elbows straight.  Begin by dropping your shoulder blades down and toward your mid-back spine.  To strengthen your protractors, keep your shoulder blades down, but slide them forward on your rib cage. It will feel as if you are lifting the back of your rib cage away from the wall. This is a subtle motion and can be challenging to complete. Ask your caregiver for further instruction, if you are not sure you are doing the exercise correctly.  Hold for __________ seconds. Slowly return to the starting position, resting the muscles completely before starting the next repetition. Repeat __________ times. Complete this exercise __________ times per day. STRENGTH - Scapular Protractors, Supine  Lie on your back on a firm surface. Extend your right / left arm straight into the air while holding a __________ weight in your hand.  Keeping your head and back in place, lift your shoulder off the floor.  Hold for __________ seconds. Slowly return to the starting position, and allow your muscles to relax completely before starting the next repetition. Repeat __________ times. Complete this exercise __________ times per day. STRENGTH - Scapular Protractors, Quadruped  Get onto your hands and knees, with your shoulders directly over your hands (or as close as you can be, comfortably).  Keeping your  elbows locked, lift the back of your rib cage up into your shoulder blades, so your mid-back rounds out. Keep your neck muscles relaxed.  Hold this position for __________ seconds. Slowly return to the starting position and allow your  muscles to relax completely before starting the next repetition. Repeat __________ times. Complete this exercise __________ times per day.  STRENGTH - Scapular Retractors  Secure a rubber exercise band or tubing to a fixed object (table, pole), so that it is at the height of your shoulders when you are either standing, or sitting on a firm armless chair.  With a palm down grip, grasp an end of the band in each hand. Straighten your elbows and lift your hands straight in front of you, at shoulder height. Step back, away from the secured end of the band, until it becomes tense.  Squeezing your shoulder blades together, draw your elbows back toward your sides, as you bend them. Keep your upper arms lifted away from your body throughout the exercise.  Hold for __________ seconds. Slowly ease the tension on the band, as you reverse the directions and return to the starting position. Repeat __________ times. Complete this exercise __________ times per day. STRENGTH - Shoulder Extensors   Secure a rubber exercise band or tubing to a fixed object (table, pole) so that it is at the height of your shoulders when you are either standing, or sitting on a firm armless chair.  With a thumbs-up grip, grasp an end of the band in each hand. Straighten your elbows and lift your hands straight in front of you, at shoulder height. Step back, away from the secured end of the band, until it becomes tense.  Squeezing your shoulder blades together, pull your hands down to the sides of your thighs. Do not allow your hands to go behind you.  Hold for __________ seconds. Slowly ease the tension on the band, as you reverse the directions and return to the starting position. Repeat  __________ times. Complete this exercise __________ times per day.  STRENGTH - Scapular Retractors and External Rotators   Secure a rubber exercise band or tubing to a fixed object (table, pole) so that it is at the height as your shoulders, when you are either standing, or sitting on a firm armless chair.  With a palm down grip, grasp an end of the band in each hand. Bend your elbows 90 degrees and lift your elbows to shoulder height, at your sides. Step back, away from the secured end of the band, until it becomes tense.  Squeezing your shoulder blades together, rotate your shoulders so that your upper arms and elbows remain stationary, but your fists travel upward to head height.  Hold for __________ seconds. Slowly ease the tension on the band, as you reverse the directions and return to the starting position. Repeat __________ times. Complete this exercise __________ times per day.  STRENGTH - Scapular Retractors and External Rotators, Rowing   Secure a rubber exercise band or tubing to a fixed object (table, pole) so that it is at the height of your shoulders, when you are either standing, or sitting on a firm armless chair.  With a palm down grip, grasp an end of the band in each hand. Straighten your elbows and lift your hands straight in front of you, at shoulder height. Step back, away from the secured end of the band, until it becomes tense.  Step 1: Squeeze your shoulder blades together. Bending your elbows, draw your hands to your chest, as if you are rowing a boat. At the end of this motion, your hands and elbow should be at shoulder height and your elbows should be out to your sides.  Step 2: Rotate your  shoulders, to raise your hands above your head. Your forearms should be vertical and your upper arms should be horizontal.  Hold for __________ seconds. Slowly ease the tension on the band, as you reverse the directions and return to the starting position. Repeat __________  times. Complete this exercise __________ times per day.  STRENGTH - Scapular Depressors  Find a sturdy chair without wheels, such as a dining room chair.  Keeping your feet on the floor, and your hands on the chair arms, lift your bottom up from the seat, and lock your elbows.  Keeping your elbows straight, allow gravity to pull your body weight down. Your shoulders will rise toward your ears.  Raise your body against gravity by drawing your shoulder blades down your back, shortening the distance between your shoulders and ears. Although your feet should always maintain contact with the floor, your feet should progressively support less body weight, as you get stronger.  Hold for __________ seconds. In a controlled and slow manner, lower your body weight to begin the next repetition. Repeat __________ times. Complete this exercise __________ times per day.    This information is not intended to replace advice given to you by your health care provider. Make sure you discuss any questions you have with your health care provider.   Document Released: 06/17/2005 Document Revised: 07/08/2014 Document Reviewed: 09/29/2008 [employer] Interactive Patient Education [employer].

## 2015-10-02 NOTE — Progress Notes (Signed)
Date of Visit: 10/02/2015   HPI:  Patient presents for follow up of diabetes and left shoulder pain.  Reports he is presently taking 100 units of lantus in the morning. He takes novolog about once per day. Has been self-adjusting novolog based on when his sugar is high. Reports he recently took 100 units of novolog in the evening because his sugar was in the 200s-300s. The next morning his sugar was in the 60s. Has not filled out blood sugar log.  Left shoulder pain continues. Was contacted by sports medicine for appointment but didn't schedule bc he was worried about copay. Pain is worse with raising arm above head and using arm strenuously.  ROS: See HPI.  [place]: history of hypertension, bicuspid aortic valve, diabetes uncontrolled, diabetic retinopathy, sickle cell trait, COPD  PHYSICAL EXAM: BP 130/74 mmHg  Pulse 81  Temp(Src) 98.1 F (36.7 C) (Oral)  Ht 5\' 10"  (1.778 m)  Wt 165 lb (74.844 kg)  BMI 23.68 kg/m2 Gen: NAD, pleasant, cooperative HEENT: normocephalic, atraumatic  Heart: regular rate and rhythm no murmur Lungs: clear to auscultation bilaterally normal work of breathing  Neuro: alert, grossly nonfocal speech normal Ext: Left shoulder with +neers & empty can test. Actively only able to raise arm to the horizontal. Passively can abduct fully. Shoulder itself is nontender.  ASSESSMENT/PLAN:  Left shoulder pain Not wanting to see sports medicine at this time due to cost issues. Counseled him that rotator cuff is likely impinged if not torn and that going to sports medicine for u/s is a good idea For now will do rotator cuff exercises and tylenol. Discuss again at follow up in a few weeks.  DM (diabetes mellitus), type 2, uncontrolled Long, frank discussion with patient today about dangerousness of dosing his novolog as high as he's been doing (gave himself 100 units of novolog one night). His meter is not reliable for helping me see how his sugars have been doing, as  the date/time is wrong on it. I stressed the importance of erring on the high side for now, and the dire importance of him filling out the CBG log. Printed a new log and gave to patient. Discussed risk of coma, permanent brain damage, and death with hypoglycemia and insulin overdose. He will follow up with me in 2-3 weeks.   FOLLOW UP: Follow up in 2-3 weeks for diabetes and shoulder  [name], [place]

## 2015-10-04 ENCOUNTER — Encounter (HOSPITAL_COMMUNITY): Payer: Self-pay | Admitting: Emergency Medicine

## 2015-10-04 ENCOUNTER — Ambulatory Visit (INDEPENDENT_AMBULATORY_CARE_PROVIDER_SITE_OTHER): Payer: Commercial Managed Care - HMO

## 2015-10-04 ENCOUNTER — Ambulatory Visit (HOSPITAL_COMMUNITY)
Admission: EM | Admit: 2015-10-04 | Discharge: 2015-10-04 | Disposition: A | Discharge status: Home / Self Care | Payer: Commercial Managed Care - HMO | Attending: Family Medicine | Admitting: Family Medicine

## 2015-10-04 DIAGNOSIS — M25512 Pain in left shoulder: Secondary | ICD-10-CM

## 2015-10-04 MED ORDER — TRAMADOL HCL 50 MG PO TABS: 50.0000 mg | ORAL_TABLET | Freq: Two times a day (BID) | ORAL | Status: DC | PRN

## 2015-10-04 NOTE — Assessment & Plan Note (Signed)
Not wanting to see sports medicine at this time due to cost issues. Counseled him that rotator cuff is likely impinged if not torn and that going to sports medicine for u/s is a good idea For now will do rotator cuff exercises and tylenol. Discuss again at follow up in a few weeks.

## 2015-10-04 NOTE — ED Provider Notes (Addendum)
CSN: [id number]     Arrival date & time 10/04/15  1527 History   First MD Initiated Contact with Patient 10/04/15 1717     Chief Complaint  Patient presents with  . Arm Injury   (Consider location/radiation/quality/duration/timing/severity/associated sxs/prior Treatment) Patient is a 60 y.o. male presenting with arm injury. The history is provided by the patient. No language interpreter was used.  Arm Injury Associated symptoms: no fatigue and no fever   Patient presents to [facility] for complaint of worsening L shoulder pain, onset about 1 month ago when he opened shed door and was immediately struck by a sharp shooting pain up the L forearm to the lateral aspect of the L shoulder. He insists that this incident occurred after the [facility] visit in mid_February (08/11/15) during which he had a x-ray showing possible signs of impingement syndrome.  Patient was seen by his primary doctor 2 days ago and [facility]; patient is reticent to go because he is concerned about out-of-pocket costs.   He is taking Tylenol alone for the pain, not noticing any relief.   Past Medical History  Diagnosis Date  . Diabetes mellitus   . COPD (chronic obstructive pulmonary disease) (HCC)   . Allergy   . Chronic kidney disease   . Bronchitis   . Gout   . Claudication [facility])     right foot ray resection  . Genital warts   . Colon polyps     hyperplastic  . Arthritis   . Cataract    Past Surgical History  Procedure Laterality Date  . Surgery left great toe    . Tear ducts bilateral eyes    . I&d extremity  04/11/2012    Procedure: IRRIGATION AND DEBRIDEMENT EXTREMITY;  Surgeon: Toni Arthurs, MD;  Location: [facility] OR;  Service: Orthopedics;  Laterality: Right;   Family History  Problem Relation Age of Onset  . Diabetes Mother   . Stroke Mother   . Heart failure Father    Social History  Substance Use Topics  . Smoking status: Current Every Day Smoker -- 0.30  packs/day for 48 years    Types: Cigars    Start date: 07/02/1963  . Smokeless tobacco: Current User     Comment: wants to use electric cigarettes.  not interested in pills, worried about chantix side effects  . Alcohol Use: No    Review of Systems  Constitutional: Negative for fever, chills, diaphoresis and fatigue.  Neurological: Negative for dizziness, seizures and headaches.  All other systems reviewed and are negative.   Allergies  Review of patient's allergies indicates no known allergies.  Home Medications   Prior to Admission medications   Medication Sig Start Date End Date Taking? Authorizing Provider  ACCU-CHEK FASTCLIX LANCETS MISC  12/30/14  Yes Historical Provider, MD  ACCU-CHEK SMARTVIEW test strip  12/30/14  Yes Historical Provider, MD  acetaminophen (TYLENOL) 500 MG tablet Take 2 tablets (1,000 mg total) by mouth every 8 (eight) hours as needed. 10/02/15  Yes Latrelle Dodrill, MD  albuterol (PROVENTIL HFA;VENTOLIN HFA) 108 (90 Base) MCG/ACT inhaler Inhale 2 puffs into the lungs every 6 (six) hours as needed for wheezing or shortness of breath. 07/27/15  Yes Moses Manners, MD  Alcohol Swabs (B-D SINGLE USE SWABS REGULAR) PADS  12/30/14  Yes Historical Provider, MD  aspirin EC 81 MG tablet Take 81 mg by mouth daily.   Yes Historical Provider, MD  BD INSULIN SYRINGE ULTRAFINE 31G X  5/16" 0.3 ML MISC  12/30/14  Yes Historical Provider, MD  Blood Glucose Calibration (ACCU-CHEK SMARTVIEW CONTROL) LIQD  12/30/14  Yes Historical Provider, MD  Blood Glucose Monitoring Suppl (ACCU-CHEK NANO SMARTVIEW) W/DEVICE KIT  12/30/14  Yes Historical Provider, MD  chlorhexidine (PERIDEX) 0.12 % solution Use as directed 10 mLs in the mouth or throat 4 (four) times daily. 07/27/15  Yes [name], MD  Dulaglutide (TRULICITY) 9.52 WU/1.3KG SOPN Inject 0.75 mg into the skin once a week. 07/27/15  Yes [name], MD  furosemide (LASIX) 20 MG tablet TAKE 1 TABLET BY MOUTH EVERY DAY MAY TAKE UP  TO 2 PER DAY 07/27/15  Yes [name], MD  HYDROcodone-acetaminophen (NORCO) 5-325 MG tablet Take 1 tablet by mouth every 6 (six) hours as needed for moderate pain. 08/11/15  Yes Melony Overly, MD  ibuprofen (ADVIL,MOTRIN) 200 MG tablet Take 400 mg by mouth every 6 (six) hours as needed for moderate pain.    Yes Historical Provider, MD  imiquimod Leroy Sea) 5 % cream Reported on 07/27/2015 12/29/14  Yes Historical Provider, MD  insulin glargine (LANTUS) 100 UNIT/ML injection Inject 0.8 mLs (80 Units total) into the skin at bedtime. 07/27/15  Yes [name], MD  meloxicam (MOBIC) 7.5 MG tablet Take 1 tablet (7.5 mg total) by mouth daily. 08/11/15  Yes Melony Overly, MD  metFORMIN (GLUCOPHAGE) 1000 MG tablet Take 1 tablet (1,000 mg total) by mouth 2 (two) times daily with a meal. Patient taking differently: Take 1,000 mg by mouth daily.  01/10/15  Yes [name], MD  methocarbamol (ROBAXIN) 750 MG tablet Take 1 tablet (750 mg total) by mouth every 6 (six) hours as needed for muscle spasms (and pain). 11/08/14  Yes Clayton Bibles, PA-C  naproxen (NAPROSYN) 500 MG tablet Take 1 tablet (500 mg total) by mouth 2 (two) times daily with a meal. 08/10/15  Yes Rosemarie Ax, MD  quinapril (ACCUPRIL) 5 MG tablet Take 0.5 tablets (2.5 mg total) by mouth daily. 07/27/15  Yes [name], MD  rosuvastatin (CRESTOR) 20 MG tablet Take 1 tablet (20 mg total) by mouth daily. 07/27/15  Yes [name], MD  sildenafil (VIAGRA) 100 MG tablet TAKE 1 TABLET 1 HOUR BEFORE SEXUAL RELATIONS 05/08/15  Yes [name], MD  traMADol (ULTRAM) 50 MG tablet Take 1 tablet (50 mg total) by mouth every 12 (twelve) hours as needed. 10/04/15   Willeen Niece, MD  zoster vaccine live, PF, (ZOSTAVAX) 40102 UNT/0.65ML injection Inject 19,400 Units into the skin once. 09/15/15   [name], MD   Meds Ordered and Administered this Visit  Medications - No data to display  BP 119/72 mmHg  Pulse 69  Temp(Src) 98.1  F (36.7 C) (Oral)  Resp 16  SpO2 100% No data found.   Physical Exam  Constitutional: He appears well-developed and well-nourished. No distress.  Musculoskeletal:  Full active ROM of neck in all planes.   Unable to abduct L arm to horizontal. Unable to perform empty-can test or to touch behind his back.   Full elbow flexion/extension bilaterally and symmetric.   No skin changes over the L arm or shoulder.   Handgrip full and symmetric bilaterally. Radial pulses bilaterally present and symmetric. Sensation in distal finger tips both hands full and symmetric.   Skin: He is not diaphoretic.    ED Course  Procedures (including critical care time)  Labs Review Labs Reviewed - No data to  display  Imaging Review No results found.   Visual Acuity Review  Right Eye Distance:   Left Eye Distance:   Bilateral Distance:    Right Eye Near:   Left Eye Near:    Bilateral Near:         MDM   1. Left shoulder pain    Patient with pain and limited ROM of L shoulder.  He has been seen by his primary doctor for this.  When I discuss the fact taht he has had previous X-ray for this in Feb, he insists that radiating pain worsened since that time, requests another x-ray.   X-ray today shows: some degenerative changes.  Recommended that he return to primary doctor, or to see [facility] for further evaluation. May consider subacromial bursa inj for likely impingement syndrome L shoulder.  Tramadol 45m twice daily prn for pain today.    JDalbert Mayotte MD    JWilleen Niece MD 10/04/15 1[place] MD 10/04/15 [phone]

## 2015-10-04 NOTE — Assessment & Plan Note (Signed)
Long, frank discussion with patient today about dangerousness of dosing his novolog as high as he's been doing (gave himself 100 units of novolog one night). His meter is not reliable for helping me see how his sugars have been doing, as the date/time is wrong on it. I stressed the importance of erring on the high side for now, and the dire importance of him filling out the CBG log. Printed a new log and gave to patient. Discussed risk of coma, permanent brain damage, and death with hypoglycemia and insulin overdose. He will follow up with me in 2-3 weeks.

## 2015-10-04 NOTE — ED Notes (Signed)
The patient presented to the [facility] with a complaint of left arm pain secondary to a possible strain while he was opening a door. He stated that this occurred 1 month ago.

## 2015-10-04 NOTE — Discharge Instructions (Signed)
It is a pleasure to see you today.  The x-ray of the left shoulder shows some arthritic changes.   I recommend you follow up with your primary doctor, or Sports Medicine as referred by Dr [name].   I am giving you a prescription for Tramadol 50mg  to take 1 tablet every 12 hours as needed for extreme pain.

## 2015-10-05 ENCOUNTER — Encounter: Payer: Self-pay | Admitting: Family Medicine

## 2015-10-16 ENCOUNTER — Other Ambulatory Visit: Payer: Self-pay | Admitting: Family Medicine

## 2015-10-16 ENCOUNTER — Ambulatory Visit: Payer: Self-pay | Admitting: Podiatry

## 2015-10-17 ENCOUNTER — Emergency Department (HOSPITAL_COMMUNITY)
Admission: EM | Admit: 2015-10-17 | Discharge: 2015-10-17 | Disposition: A | Discharge status: Home / Self Care | Payer: Commercial Managed Care - HMO | Attending: Emergency Medicine | Admitting: Emergency Medicine

## 2015-10-17 ENCOUNTER — Encounter (HOSPITAL_COMMUNITY): Payer: Self-pay | Admitting: *Deleted

## 2015-10-17 DIAGNOSIS — M1990 Unspecified osteoarthritis, unspecified site: Secondary | ICD-10-CM | POA: Insufficient documentation

## 2015-10-17 DIAGNOSIS — Z8679 Personal history of other diseases of the circulatory system: Secondary | ICD-10-CM | POA: Diagnosis not present

## 2015-10-17 DIAGNOSIS — M25512 Pain in left shoulder: Secondary | ICD-10-CM | POA: Diagnosis not present

## 2015-10-17 DIAGNOSIS — Z79899 Other long term (current) drug therapy: Secondary | ICD-10-CM | POA: Diagnosis not present

## 2015-10-17 DIAGNOSIS — Z794 Long term (current) use of insulin: Secondary | ICD-10-CM | POA: Diagnosis not present

## 2015-10-17 DIAGNOSIS — J449 Chronic obstructive pulmonary disease, unspecified: Secondary | ICD-10-CM | POA: Insufficient documentation

## 2015-10-17 DIAGNOSIS — H269 Unspecified cataract: Secondary | ICD-10-CM | POA: Insufficient documentation

## 2015-10-17 DIAGNOSIS — Z86010 Personal history of colonic polyps: Secondary | ICD-10-CM | POA: Insufficient documentation

## 2015-10-17 DIAGNOSIS — E1122 Type 2 diabetes mellitus with diabetic chronic kidney disease: Secondary | ICD-10-CM | POA: Insufficient documentation

## 2015-10-17 DIAGNOSIS — Z8619 Personal history of other infectious and parasitic diseases: Secondary | ICD-10-CM | POA: Diagnosis not present

## 2015-10-17 DIAGNOSIS — G8929 Other chronic pain: Secondary | ICD-10-CM | POA: Insufficient documentation

## 2015-10-17 DIAGNOSIS — Z791 Long term (current) use of non-steroidal anti-inflammatories (NSAID): Secondary | ICD-10-CM | POA: Diagnosis not present

## 2015-10-17 DIAGNOSIS — Z7984 Long term (current) use of oral hypoglycemic drugs: Secondary | ICD-10-CM | POA: Insufficient documentation

## 2015-10-17 DIAGNOSIS — Z7982 Long term (current) use of aspirin: Secondary | ICD-10-CM | POA: Insufficient documentation

## 2015-10-17 DIAGNOSIS — N189 Chronic kidney disease, unspecified: Secondary | ICD-10-CM | POA: Insufficient documentation

## 2015-10-17 DIAGNOSIS — F17210 Nicotine dependence, cigarettes, uncomplicated: Secondary | ICD-10-CM | POA: Insufficient documentation

## 2015-10-17 NOTE — ED Provider Notes (Signed)
CSN: [id number]     Arrival date & time 10/17/15  1658 History  By signing my name below, I, Eustaquio Maize, attest that this documentation has been prepared under the direction and in the presence of [employer], PA-C.  Electronically Signed: Eustaquio Maize, ED Scribe. 10/17/2015. 5:58 PM.   Chief Complaint  Patient presents with  . Shoulder Pain   The history is provided by the patient. No language interpreter was used.     HPI Comments: Adam Ray is a 60 y.o. male who presents to the Emergency Department complaining of sudden onset, constant, left shoulder pain x 2 months. Pt reports that he began having pain after he opened a shed door and felt immediate pain to the shoulder. No new injury since onset. Pt has been seen by his PCP, Dr. [name], for this pain and has had 2 xrays with no acute findings. Pt was told to follow up with sports medicine. Pt states that he does not want to go to sports medicine because he believes this pain can be resolved without seeing them. Pt would like a CT or MRI to see what is wrong with his shoulder. Denies weakness, numbness, tingling, bruising, swelling, fever, or any other associated symptoms.    Past Medical History  Diagnosis Date  . Diabetes mellitus   . COPD (chronic obstructive pulmonary disease) ([place])   . Allergy   . Chronic kidney disease   . Bronchitis   . Gout   . Claudication [facility])     right foot ray resection  . Genital warts   . Colon polyps     hyperplastic  . Arthritis   . Cataract    Past Surgical History  Procedure Laterality Date  . Surgery left great toe    . Tear ducts bilateral eyes    . I&d extremity  04/11/2012    Procedure: IRRIGATION AND DEBRIDEMENT EXTREMITY;  Surgeon: Wylene Simmer, MD;  Location: [place];  Service: Orthopedics;  Laterality: Right;   Family History  Problem Relation Age of Onset  . Diabetes Mother   . Stroke Mother   . Heart failure Father    Social History  Substance Use Topics  .  Smoking status: Current Every Day Smoker -- 0.30 packs/day for 48 years    Types: Cigars    Start date: 07/02/1963  . Smokeless tobacco: Current User     Comment: wants to use electric cigarettes.  not interested in pills, worried about chantix side effects  . Alcohol Use: No    Review of Systems  Constitutional: Negative for fever and chills.  Musculoskeletal: Positive for arthralgias (left shoulder). Negative for joint swelling.  Skin: Negative for color change.  Neurological: Negative for weakness and numbness.  All other systems reviewed and are negative.  Allergies  Review of patient's allergies indicates no known allergies.  Home Medications   Prior to Admission medications   Medication Sig Start Date End Date Taking? Authorizing Provider  ACCU-CHEK FASTCLIX LANCETS [place]  12/30/14   Historical Provider, MD  ACCU-CHEK SMARTVIEW test strip  12/30/14   Historical Provider, MD  acetaminophen (TYLENOL) 500 MG tablet Take 2 tablets (1,000 mg total) by mouth every 8 (eight) hours as needed. 10/02/15   [name], MD  albuterol (PROVENTIL HFA;VENTOLIN HFA) 108 (90 Base) MCG/ACT inhaler Inhale 2 puffs into the lungs every 6 (six) hours as needed for wheezing or shortness of breath. 07/27/15   [name], MD  Alcohol Swabs (B-D SINGLE  USE SWABS REGULAR) PADS  12/30/14   Historical Provider, MD  aspirin EC 81 MG tablet Take 81 mg by mouth daily.    Historical Provider, MD  BD INSULIN SYRINGE ULTRAFINE 31G X 5/16" 0.3 ML MISC  12/30/14   Historical Provider, MD  Blood Glucose Calibration (ACCU-CHEK SMARTVIEW CONTROL) LIQD  12/30/14   Historical Provider, MD  Blood Glucose Monitoring Suppl (ACCU-CHEK NANO SMARTVIEW) W/DEVICE KIT  12/30/14   Historical Provider, MD  chlorhexidine (PERIDEX) 0.12 % solution Use as directed 10 mLs in the mouth or throat 4 (four) times daily. 07/27/15   [name], MD  Dulaglutide (TRULICITY) 8.58 IF/0.2DX SOPN Inject 0.75 mg into the skin once a week.  07/27/15   [name], MD  furosemide (LASIX) 20 MG tablet TAKE 1 TABLET BY MOUTH EVERY DAY MAY TAKE UP TO 2 PER DAY 07/27/15   [name], MD  HYDROcodone-acetaminophen (NORCO) 5-325 MG tablet Take 1 tablet by mouth every 6 (six) hours as needed for moderate pain. 08/11/15   Melony Overly, MD  ibuprofen (ADVIL,MOTRIN) 200 MG tablet Take 400 mg by mouth every 6 (six) hours as needed for moderate pain.     Historical Provider, MD  imiquimod Leroy Sea) 5 % cream Reported on 07/27/2015 12/29/14   Historical Provider, MD  insulin aspart (NOVOLOG) 100 UNIT/ML injection Inject up to 25 units subcutaneously with meals per sliding scale. 10/17/15   [name], MD  insulin glargine (LANTUS) 100 UNIT/ML injection Inject 0.8 mLs (80 Units total) into the skin at bedtime. 07/27/15   [name], MD  meloxicam (MOBIC) 7.5 MG tablet Take 1 tablet (7.5 mg total) by mouth daily. 08/11/15   Melony Overly, MD  metFORMIN (GLUCOPHAGE) 1000 MG tablet Take 1 tablet (1,000 mg total) by mouth 2 (two) times daily with a meal. Patient taking differently: Take 1,000 mg by mouth daily.  01/10/15   [name], MD  methocarbamol (ROBAXIN) 750 MG tablet Take 1 tablet (750 mg total) by mouth every 6 (six) hours as needed for muscle spasms (and pain). 11/08/14   Clayton Bibles, PA-C  naproxen (NAPROSYN) 500 MG tablet Take 1 tablet (500 mg total) by mouth 2 (two) times daily with a meal. 08/10/15   Rosemarie Ax, MD  quinapril (ACCUPRIL) 5 MG tablet Take 0.5 tablets (2.5 mg total) by mouth daily. 07/27/15   [name], MD  rosuvastatin (CRESTOR) 20 MG tablet Take 1 tablet (20 mg total) by mouth daily. 07/27/15   [name], MD  sildenafil (VIAGRA) 100 MG tablet TAKE 1 TABLET 1 HOUR BEFORE SEXUAL RELATIONS 05/08/15   [name], MD  traMADol (ULTRAM) 50 MG tablet Take 1 tablet (50 mg total) by mouth every 12 (twelve) hours as needed. 10/04/15   Willeen Niece, MD  zoster vaccine live, PF, (ZOSTAVAX)  19400 UNT/0.65ML injection Inject 19,400 Units into the skin once. 09/15/15   [name], MD   BP 120/69 mmHg  Pulse 65  Temp(Src) 97.9 F (36.6 C) (Oral)  Resp 16  Wt 165 lb (74.844 kg)  SpO2 99%   Physical Exam  Constitutional: He is oriented to person, place, and time. He appears well-developed and well-nourished. No distress.  HENT:  Head: Normocephalic and atraumatic.  Eyes: Conjunctivae and EOM are normal.  Neck: Neck supple. No tracheal deviation present.  Cardiovascular: Normal rate, regular rhythm, normal heart sounds and intact distal pulses.   Pulses:  Radial pulses are 2+ on the left side.  Pulmonary/Chest: Effort normal and breath sounds normal. No respiratory distress. He has no wheezes. He has no rales.  Musculoskeletal: He exhibits tenderness.  Diffuse TTP of left shoulder.  No erythema, edema, warmth. ROM of the left shoulder limited with abduction and flexion.  Pt does have some pain with abduction and flexion.  Neurological: He is alert and oriented to person, place, and time.  Distal sensation of left hand is intact.   Skin: Skin is warm and dry.  Psychiatric: He has a normal mood and affect. His behavior is normal.  Nursing note and vitals reviewed.   ED Course  Procedures (including critical care time)  DIAGNOSTIC STUDIES: Oxygen Saturation is 99% on RA, normal by my interpretation.    COORDINATION OF CARE: 5:56 PM-Discussed treatment plan which includes follow up with PCP or sports medicine with pt at bedside and pt agreed to plan.   Labs Review Labs Reviewed - No data to display  Imaging Review No results found.   EKG Interpretation None      MDM   Final diagnoses:  None  Patient presents today with chronic pain of the left shoulder.  He has been seen by his PCP for this several times in the past and referred to Sports Medicine, but has not followed up.  He denies any new injury or trauma.  No signs of infection.   Neurovascularly intact.  Do not feel that any emergent imaging is indicated at this time.  Stable for discharge.  Return precautions given.  I personally performed the services described in this documentation, which was scribed in my presence. The recorded information has been reviewed and is accurate.      Hyman Bible, PA-C 10/17/15 [place], MD 10/17/15 [phone]

## 2015-10-17 NOTE — ED Notes (Signed)
Pt injured his shoulder 2-3 months ago and continues to have pain, has had x-rays done.  Saw his MD today and was told to come to the ED for a CT scan.  No fever or chills

## 2015-10-19 ENCOUNTER — Encounter: Payer: Self-pay | Admitting: Family Medicine

## 2015-10-19 ENCOUNTER — Ambulatory Visit (INDEPENDENT_AMBULATORY_CARE_PROVIDER_SITE_OTHER): Payer: Commercial Managed Care - HMO | Admitting: Family Medicine

## 2015-10-19 VITALS — BP 103/58 | HR 79 | Temp 98.4°F | Ht 70.0 in | Wt 173.1 lb

## 2015-10-19 DIAGNOSIS — E118 Type 2 diabetes mellitus with unspecified complications: Secondary | ICD-10-CM | POA: Diagnosis not present

## 2015-10-19 DIAGNOSIS — E1165 Type 2 diabetes mellitus with hyperglycemia: Secondary | ICD-10-CM

## 2015-10-19 DIAGNOSIS — Z794 Long term (current) use of insulin: Secondary | ICD-10-CM

## 2015-10-19 DIAGNOSIS — M25512 Pain in left shoulder: Secondary | ICD-10-CM

## 2015-10-19 DIAGNOSIS — E1121 Type 2 diabetes mellitus with diabetic nephropathy: Secondary | ICD-10-CM | POA: Diagnosis not present

## 2015-10-19 DIAGNOSIS — IMO0002 Reserved for concepts with insufficient information to code with codable children: Secondary | ICD-10-CM

## 2015-10-19 NOTE — Patient Instructions (Addendum)
Getting MRI of left shoulder Referring to endocrinologist  Keep filling out the blood sugar charts  Check your blood sugar before each meal. Give yourself the following amount of novolog with the meal, based on what your blood sugar is: Blood Sugar Novolog Dose  Under 150 None  150-199 6 units  200-249 10 units  250-299 15 units  300-349 20 units  350-400 25 units  Over 400 Call doctor   Do not give yourself more than 25-30 units of novolog!  Be well, Dr. [name]

## 2015-10-23 ENCOUNTER — Ambulatory Visit (HOSPITAL_COMMUNITY): Admission: RE | Admit: 2015-10-23 | Payer: Commercial Managed Care - HMO | Source: Ambulatory Visit

## 2015-10-23 NOTE — Assessment & Plan Note (Signed)
CBGs remain somewhat erratic. He has not followed my suggestions about how to dose his insulin. The amounts he gives himself are variable and do not seem to follow a particular pattern. Discussed with patient today that clearly he and I are not succesfully managing his diabetes together. Offered to refer him to an endocrinologist. He is agreeable to this. I will enter referral and continue to follow up with him every few weeks until care of his diabetes is transferred to endocrinology. Stressed importance of not overdosing insulin again today.

## 2015-10-23 NOTE — Progress Notes (Signed)
Date of Visit: 10/19/2015   HPI:  Patient presents for follow up.  Diabetes - He has brought his blood sugar log with him. Sugars range from 65 up to 400s but majority are in the 100-200 range. Dosing of insulin is very variable and somewhat difficult to follow from his sugar chart. Has given himself typically 50-80units of novolog at a time (despite me stressing to him at last visit that he needs to not go more than 30 units of novolog at a time). Doses lantus most mornings, records that he typically gives himself 50-80 units of lantus each morning, but it varies from day to day.   Left shoulder pain - has had ongoing shoudler pain since February. Seen in the ED recently and requested MRI but they deferred this to outpatient setting. Does not want to see sports medicine. Would like MRI of shoulder. Still pain and weakness with abducting shoulder above the horizontal.  ROS: See HPI.  [place]: history of hypertension, bicuspid aortic valve, copd, uncontrolled diabetes with retinopathy  PHYSICAL EXAM: BP 103/58 mmHg  Pulse 79  Temp(Src) 98.4 F (36.9 C) (Oral)  Ht 5\' 10"  (1.778 m)  Wt 173 lb 1.6 oz (78.518 kg)  BMI 24.84 kg/m2 Gen: NAD, pleasant, cooperative HEENT: normocephalic, atraumatic, moist mucous membranes  Lungs: normal work of breathing  Neuro: alert, grossly nonfocal, speech normal Ext: L shoulder atraumatic. Pain with abdduction of shoulder, unable to actively abduct above the horizontal. Passive abduction reveals better ROM. Full grip strength bilaterally.  ASSESSMENT/PLAN:  DM (diabetes mellitus), type 2, uncontrolled CBGs remain somewhat erratic. He has not followed my suggestions about how to dose his insulin. The amounts he gives himself are variable and do not seem to follow a particular pattern. Discussed with patient today that clearly he and I are not succesfully managing his diabetes together. Offered to refer him to an endocrinologist. He is agreeable to this. I will  enter referral and continue to follow up with him every few weeks until care of his diabetes is transferred to endocrinology. Stressed importance of not overdosing insulin again today.   Left shoulder pain Given inability to abduct above horizontal actively, warrants further imaging. Had tried to avoid MRI and instead pursue ultrasound at sports medication, but patient prefers MRI. Will order & schedule this.   FOLLOW UP: Follow up in 2-3 weeks for diabetes Referring to endocrinology  [name], [place]

## 2015-10-23 NOTE — Assessment & Plan Note (Signed)
Given inability to abduct above horizontal actively, warrants further imaging. Had tried to avoid MRI and instead pursue ultrasound at sports medication, but patient prefers MRI. Will order & schedule this.

## 2015-10-26 ENCOUNTER — Ambulatory Visit (HOSPITAL_COMMUNITY)
Admission: RE | Admit: 2015-10-26 | Discharge: 2015-10-26 | Disposition: A | Discharge status: Home / Self Care | Payer: Commercial Managed Care - HMO | Source: Ambulatory Visit | Attending: Family Medicine | Admitting: Family Medicine

## 2015-10-26 DIAGNOSIS — M7552 Bursitis of left shoulder: Secondary | ICD-10-CM | POA: Diagnosis not present

## 2015-10-26 DIAGNOSIS — M25512 Pain in left shoulder: Secondary | ICD-10-CM | POA: Diagnosis not present

## 2015-10-26 DIAGNOSIS — M19012 Primary osteoarthritis, left shoulder: Secondary | ICD-10-CM | POA: Insufficient documentation

## 2015-10-26 DIAGNOSIS — M62512 Muscle wasting and atrophy, not elsewhere classified, left shoulder: Secondary | ICD-10-CM | POA: Diagnosis not present

## 2015-10-26 DIAGNOSIS — D759 Disease of blood and blood-forming organs, unspecified: Secondary | ICD-10-CM | POA: Insufficient documentation

## 2015-10-26 DIAGNOSIS — M75122 Complete rotator cuff tear or rupture of left shoulder, not specified as traumatic: Secondary | ICD-10-CM | POA: Insufficient documentation

## 2015-10-30 ENCOUNTER — Telehealth: Payer: Self-pay | Admitting: Family Medicine

## 2015-10-30 DIAGNOSIS — M75102 Unspecified rotator cuff tear or rupture of left shoulder, not specified as traumatic: Secondary | ICD-10-CM

## 2015-10-30 NOTE — Telephone Encounter (Signed)
Called patient to discuss MRI results MRI shows complete tear of supraspinatus and infraspinatus tendons, and near complete tear of subscapularis.  Recommend that he sees a [employer] fast streamer. I will enter referral. Patient agreeable to this plan.  [name], MD

## 2015-10-30 NOTE — Telephone Encounter (Signed)
Called patient to discuss. He was just letting me know that his pharmacy had an early rx for quinapril. Not sure where this came from as I did not send this in. Advised if he has plenty he can just tell his pharmacy that he doesn't need it yet.  [name], MD

## 2015-10-30 NOTE — Telephone Encounter (Signed)
Pt called and would like to speak to Dr. [name] about a personal issue. jw

## 2015-11-06 ENCOUNTER — Ambulatory Visit (INDEPENDENT_AMBULATORY_CARE_PROVIDER_SITE_OTHER): Payer: Commercial Managed Care - HMO | Admitting: Podiatry

## 2015-11-06 ENCOUNTER — Encounter: Payer: Self-pay | Admitting: Podiatry

## 2015-11-06 ENCOUNTER — Encounter: Payer: Self-pay | Admitting: Endocrinology

## 2015-11-06 DIAGNOSIS — E1149 Type 2 diabetes mellitus with other diabetic neurological complication: Secondary | ICD-10-CM

## 2015-11-06 DIAGNOSIS — M2012 Hallux valgus (acquired), left foot: Secondary | ICD-10-CM

## 2015-11-06 DIAGNOSIS — Z899 Acquired absence of limb, unspecified: Secondary | ICD-10-CM | POA: Diagnosis not present

## 2015-11-06 DIAGNOSIS — M2040 Other hammer toe(s) (acquired), unspecified foot: Secondary | ICD-10-CM

## 2015-11-06 DIAGNOSIS — B351 Tinea unguium: Secondary | ICD-10-CM | POA: Diagnosis not present

## 2015-11-06 DIAGNOSIS — M79676 Pain in unspecified toe(s): Secondary | ICD-10-CM | POA: Diagnosis not present

## 2015-11-06 DIAGNOSIS — L84 Corns and callosities: Secondary | ICD-10-CM | POA: Diagnosis not present

## 2015-11-06 NOTE — Patient Instructions (Signed)
Diabetes and Foot Care Diabetes may cause you to have problems because of poor blood supply (circulation) to your feet and legs. This may cause the skin on your feet to become thinner, break easier, and heal more slowly. Your skin may become dry, and the skin may peel and crack. You may also have nerve damage in your legs and feet causing decreased feeling in them. You may not notice minor injuries to your feet that could lead to infections or more serious problems. Taking care of your feet is one of the most important things you can do for yourself.  HOME CARE INSTRUCTIONS  Wear shoes at all times, even in the house. Do not go barefoot. Bare feet are easily injured.  Check your feet daily for blisters, cuts, and redness. If you cannot see the bottom of your feet, use a mirror or ask someone for help.  Wash your feet with warm water (do not use hot water) and mild soap. Then pat your feet and the areas between your toes until they are completely dry. Do not soak your feet as this can dry your skin.  Apply a moisturizing lotion or petroleum jelly (that does not contain alcohol and is unscented) to the skin on your feet and to dry, brittle toenails. Do not apply lotion between your toes.  Trim your toenails straight across. Do not dig under them or around the cuticle. File the edges of your nails with an emery board or nail file.  Do not cut corns or calluses or try to remove them with medicine.  Wear clean socks or stockings every day. Make sure they are not too tight. Do not wear knee-high stockings since they may decrease blood flow to your legs.  Wear shoes that fit properly and have enough cushioning. To break in new shoes, wear them for just a few hours a day. This prevents you from injuring your feet. Always look in your shoes before you put them on to be sure there are no objects inside.  Do not cross your legs. This may decrease the blood flow to your feet.  If you find a minor scrape,  cut, or break in the skin on your feet, keep it and the skin around it clean and dry. These areas may be cleansed with mild soap and water. Do not cleanse the area with peroxide, alcohol, or iodine.  When you remove an adhesive bandage, be sure not to damage the skin around it.  If you have a wound, look at it several times a day to make sure it is healing.  Do not use heating pads or hot water bottles. They may burn your skin. If you have lost feeling in your feet or legs, you may not know it is happening until it is too late.  Make sure your health care provider performs a complete foot exam at least annually or more often if you have foot problems. Report any cuts, sores, or bruises to your health care provider immediately. SEEK MEDICAL CARE IF:   You have an injury that is not healing.  You have cuts or breaks in the skin.  You have an ingrown nail.  You notice redness on your legs or feet.  You feel burning or tingling in your legs or feet.  You have pain or cramps in your legs and feet.  Your legs or feet are numb.  Your feet always feel cold. SEEK IMMEDIATE MEDICAL CARE IF:   There is increasing redness,   swelling, or pain in or around a wound.  There is a red line that goes up your leg.  Pus is coming from a wound.  You develop a fever or as directed by your health care provider.  You notice a bad smell coming from an ulcer or wound.   This information is not intended to replace advice given to you by your health care provider. Make sure you discuss any questions you have with your health care provider.   Document Released: 06/14/2000 Document Revised: 02/17/2013 Document Reviewed: 11/24/2012 Elsevier Interactive Patient Education 2016 [employer]  

## 2015-11-06 NOTE — Progress Notes (Signed)
   Subjective:    Patient ID: Adam Ray, male    DOB: 06-22-1956, 60 y.o.   MRN: [id number]  HPI  60-year-old male presents the office today for concerns of thick, painful, elongated toenails that he cannot trim himself. He also has calluses to both of his feet which are painful. 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 He states the nails and the callouses are painful particularly pressure. He states his blood sugars "pretty good" but does not recall a A1c. He is requesting diabetic shoes today. No other complaints.   Review of Systems  All other systems reviewed and are negative.      Objective:   Physical Exam General: AAO x3, NAD  Dermatological: Hypertrophic, dystrophic, brittle, discolored, elongated 9. His been a previous partial first ray amputation of the right side. There is tenderness to nails 1-5 bilaterally except for the right. No surrounding redness or drainage. Hyperkeratotic lesion on the left bunion as well as right medial midfoot. Upon debridement no underlying ulceration, drainage or other signs of infection. No other open lesions or pre-ulcerative lesions identified at this time.  Vascular: Dorsalis Pedis artery and Posterior Tibial artery pedal pulses are 2/4 bilateral with immedate capillary fill time. Pedal hair growth present. No varicosities and no lower extremity edema present bilateral. There is no pain with calf compression, swelling, warmth, erythema.   Neruologic: Sensation decreases Simms Weinstein monofilament.  Musculoskeletal: Any present on the left side as well as hammertoes bilaterally. No other areas of tenderness bilaterally.  Gait: Unassisted, Nonantalgic.      Assessment & Plan:  60 year old male symptomatic onychomycosis, hyperkeratotic lesion. -Treatment options discussed including all alternatives, risks, and complications -Nails debrided 9 without complications or bleeding -Hyperkeratotic lesion  debrided 2 without complications or bleeding. -Given previous dictation as well as deformity to his foot recommended diabetic shoes. He is completed today. -Patient was wanted me to trim more of the nails as well as the calluses however I was short on the nails and down to healthy skin. He was wanting me to do more stating it "will not kill me" but I did not want to go shorter for risk of infection.  -Follow-up in 3 months or sooner if any problems arise. In the meantime, encouraged to call the office with any questions, concerns, change in symptoms.   Celesta Gentile, DPM

## 2015-11-15 ENCOUNTER — Ambulatory Visit (INDEPENDENT_AMBULATORY_CARE_PROVIDER_SITE_OTHER): Payer: Commercial Managed Care - HMO | Admitting: Family Medicine

## 2015-11-15 ENCOUNTER — Encounter: Payer: Self-pay | Admitting: Family Medicine

## 2015-11-15 VITALS — BP 127/89 | HR 80 | Temp 98.4°F | Ht 70.0 in | Wt 167.9 lb

## 2015-11-15 DIAGNOSIS — Z794 Long term (current) use of insulin: Secondary | ICD-10-CM

## 2015-11-15 DIAGNOSIS — E1165 Type 2 diabetes mellitus with hyperglycemia: Secondary | ICD-10-CM

## 2015-11-15 DIAGNOSIS — M25512 Pain in left shoulder: Secondary | ICD-10-CM | POA: Diagnosis not present

## 2015-11-15 DIAGNOSIS — E1121 Type 2 diabetes mellitus with diabetic nephropathy: Secondary | ICD-10-CM

## 2015-11-15 DIAGNOSIS — IMO0002 Reserved for concepts with insufficient information to code with codable children: Secondary | ICD-10-CM

## 2015-11-15 LAB — POCT GLYCOSYLATED HEMOGLOBIN (HGB A1C): Hemoglobin A1C: 8.9

## 2015-11-15 MED ORDER — TRAMADOL HCL 50 MG PO TABS: 50.0000 mg | ORAL_TABLET | Freq: Three times a day (TID) | ORAL | Status: DC | PRN

## 2015-11-15 MED ORDER — ACCU-CHEK AVIVA DEVI
Status: AC
Start: 2015-11-15 — End: 2016-11-14

## 2015-11-15 NOTE — Patient Instructions (Signed)
Sent in a new meter for you  We will call you and give you an update on the referral for your shoulder once our referral coordinator gets back in  Follow up with me in 6 weeks after the endocrinology appointment   Prescribing tramadol for your shoulder pain  Be well, Dr. [name]

## 2015-11-15 NOTE — Progress Notes (Signed)
Date of Visit: 11/15/2015   HPI:  Patient presents to discuss his MRI results and for routine follow up.  Diabetes - taking novolog 50 units at night. Variable amounts of lantus. He doses his insulin based on what he finds his sugars to be and how he feels he should dose it. Does not follow my instructions typically. Has upcoming appointment with endocrinology, who will hopefully assume care of his diabetes. Needs new glucometer.  MRI shoulder - reviewed results with him, including showing him pictures. antiicpate he may need shoulder surgery. Has not yet heard about appointment with orthopedic surgeon.  ROS: See HPI.  [place]: history of hypertension, diabetes, copd  PHYSICAL EXAM: BP 127/89 mmHg  Pulse 80  Temp(Src) 98.4 F (36.9 C) (Oral)  Ht 5\' 10"  (1.778 m)  Wt 167 lb 14.4 oz (76.159 kg)  BMI 24.09 kg/m2 Gen: NAD, pleasant, cooperative Ext: L shoulder without any palpable abnormalities  ASSESSMENT/PLAN:  DM (diabetes mellitus), type 2, uncontrolled A1c today 8.9 Has upcoming appointment with endocrinology to manage his diabetes Again counseled on risks of taking so much novolog all at once Gave new rx for glucometer  Left shoulder pain Advised he should hear soon about ortho appointment. Will rx tramadol for pain control.    FOLLOW UP: Follow up in 6 weeks for chronic medical problems  [name], [place]

## 2015-11-16 ENCOUNTER — Telehealth: Payer: Self-pay | Admitting: Family Medicine

## 2015-11-16 MED ORDER — ACCU-CHEK SOFTCLIX LANCETS MISC: Status: DC

## 2015-11-16 MED ORDER — GLUCOSE BLOOD VI STRP: ORAL_STRIP | Status: DC

## 2015-11-16 NOTE — Telephone Encounter (Signed)
Patient asks PCP to order strips to check his sugar ASAP. Please, follow up.

## 2015-11-16 NOTE — Telephone Encounter (Signed)
Pt informed. [place], CMA  

## 2015-11-16 NOTE — Telephone Encounter (Signed)
Rx sent in. Please inform patient  [name], MD

## 2015-11-20 NOTE — Assessment & Plan Note (Signed)
Advised he should hear soon about ortho appointment. Will rx tramadol for pain control.

## 2015-11-20 NOTE — Assessment & Plan Note (Signed)
A1c today 8.9 Has upcoming appointment with endocrinology to manage his diabetes Again counseled on risks of taking so much novolog all at once Gave new rx for glucometer

## 2015-12-04 ENCOUNTER — Telehealth: Payer: Self-pay | Admitting: Family Medicine

## 2015-12-04 NOTE — Telephone Encounter (Signed)
Pt is calling to check the status of his surgery. He said that the doctor was going to get back with him . Please call to discuss. jw

## 2015-12-05 NOTE — Telephone Encounter (Signed)
Thanks for your help, Tia. He may not have realized about having the appointment with Dr. Marlou Sa already scheduled. [place], would you call him and let him know about that appointment?  Thanks very much, [name], MD

## 2015-12-05 NOTE — Telephone Encounter (Signed)
Patient was referred initially to [facility] but has a balance ( I and Dr. [name] discussed this issue). I sent the referral to [facility] on 11/16/15. Called their office this morning, and he has an appt scheduled with Dr. Marlou Sa tomorrow 12/06/15 @ 3:15 PM. They called him last month and scheduled this. So I am not sure what exactly the patient is needing.

## 2015-12-05 NOTE — Telephone Encounter (Signed)
Patient informed, provided him the number to [place].

## 2015-12-05 NOTE — Telephone Encounter (Signed)
He's asking about the referral to orthopedics. He hasn't heard yet about an appointment Tia can y ou check on status? I know you sent over the referral info but could you contact the ortho office?  Thanks so much, [name], MD

## 2015-12-06 DIAGNOSIS — M75122 Complete rotator cuff tear or rupture of left shoulder, not specified as traumatic: Secondary | ICD-10-CM | POA: Diagnosis not present

## 2015-12-08 ENCOUNTER — Encounter: Payer: Self-pay | Admitting: Endocrinology

## 2015-12-08 ENCOUNTER — Ambulatory Visit (INDEPENDENT_AMBULATORY_CARE_PROVIDER_SITE_OTHER): Payer: Commercial Managed Care - HMO | Admitting: Endocrinology

## 2015-12-08 VITALS — BP 121/62 | HR 61 | Temp 98.3°F | Ht 70.0 in | Wt 170.6 lb

## 2015-12-08 DIAGNOSIS — IMO0002 Reserved for concepts with insufficient information to code with codable children: Secondary | ICD-10-CM

## 2015-12-08 DIAGNOSIS — E1151 Type 2 diabetes mellitus with diabetic peripheral angiopathy without gangrene: Secondary | ICD-10-CM | POA: Diagnosis not present

## 2015-12-08 DIAGNOSIS — E1121 Type 2 diabetes mellitus with diabetic nephropathy: Secondary | ICD-10-CM

## 2015-12-08 DIAGNOSIS — E1149 Type 2 diabetes mellitus with other diabetic neurological complication: Secondary | ICD-10-CM | POA: Insufficient documentation

## 2015-12-08 DIAGNOSIS — Z794 Long term (current) use of insulin: Secondary | ICD-10-CM | POA: Diagnosis not present

## 2015-12-08 DIAGNOSIS — E1165 Type 2 diabetes mellitus with hyperglycemia: Secondary | ICD-10-CM

## 2015-12-08 MED ORDER — INSULIN GLARGINE 100 UNIT/ML ~~LOC~~ SOLN: 70.0000 [IU] | Freq: Every day | SUBCUTANEOUS | Status: DC

## 2015-12-08 MED ORDER — INSULIN ASPART 100 UNIT/ML FLEXPEN: 30.0000 [IU] | PEN_INJECTOR | Freq: Three times a day (TID) | SUBCUTANEOUS | Status: DC

## 2015-12-08 MED ORDER — INSULIN ASPART 100 UNIT/ML ~~LOC~~ SOLN: 30.0000 [IU] | Freq: Three times a day (TID) | SUBCUTANEOUS | Status: DC

## 2015-12-08 MED ORDER — INSULIN GLARGINE 100 UNIT/ML SOLOSTAR PEN: 70.0000 [IU] | PEN_INJECTOR | Freq: Every day | SUBCUTANEOUS | Status: DC

## 2015-12-08 NOTE — Progress Notes (Signed)
Pre visit review using our clinic tool,if applicable. No additional management support is needed unless otherwise documented below in the visit note.  

## 2015-12-08 NOTE — Patient Instructions (Addendum)
good diet and exercise significantly improve the control of your diabetes.  please let me know if you wish to be referred to a dietician.  high blood sugar is very risky to your health.  you should see an eye doctor and dentist every year.  It is very important to get all recommended vaccinations.  controlling your blood pressure and cholesterol drastically reduces the damage diabetes does to your body.  Those who smoke should quit.  please discuss these with your doctor.  check your blood sugar twice a day.  vary the time of day when you check, between before the 3 meals, and at bedtime.  also check if you have symptoms of your blood sugar being too high or too low.  please keep a record of the readings and bring it to your next appointment here (or you can bring the meter itself).  You can write it on any piece of paper.  please call us sooner if your blood sugar goes below 70, or if you have a lot of readings over 200. For now, please: Stop taking the trulicity and metformin, and: Take lantus, 70 units at bedtime, and: Increase the novolog to 30 units 3 times a day (just before each meal).   Please call us next week, to tell us how the blood sugar is doing.   Please come back for a follow-up appointment in 6 weeks.

## 2015-12-08 NOTE — Progress Notes (Signed)
Subjective:    Patient ID: Adam Ray, male    DOB: 26-Jun-1956, 60 y.o.   MRN: [id number]  HPI pt states DM was dx'ed in 1990; he has mild neuropathy of the lower extremities; he has associated retinopathy, right great toe amputation, and PAD; he has been on insulin since 1997; pt says his diet and exercise are good; he has never had pancreatitis, severe hypoglycemia or DKA.  He is disabled.  He takes novolog 25 units qhs, lantus, 25 units 3 times a day (just before each meal), trulicuty, and metformin.  He saysHe says cbg's vary from 60-300's.  It is in general higher as the day goes on.  He says he never misses the insulin.   Past Medical History  Diagnosis Date  . Diabetes mellitus   . COPD (chronic obstructive pulmonary disease) ([place])   . Allergy   . Chronic kidney disease   . Bronchitis   . Gout   . Claudication [facility])     right foot ray resection  . Genital warts   . Colon polyps     hyperplastic  . Arthritis   . Cataract     Past Surgical History  Procedure Laterality Date  . Surgery left great toe    . Tear ducts bilateral eyes    . I&d extremity  04/11/2012    Procedure: IRRIGATION AND DEBRIDEMENT EXTREMITY;  Surgeon: Wylene Simmer, MD;  Location: [facility];  Service: Orthopedics;  Laterality: Right;    Social History   Social History  . Marital Status: Married    Spouse Name: N/A  . Number of Children: 3  . Years of Education: N/A   Occupational History  . disabled    Social History Main Topics  . Smoking status: Former Smoker -- 0.30 packs/day for 48 years    Types: Cigars    Start date: 07/02/1963  . Smokeless tobacco: Former Systems developer     Comment: wants to use electric cigarettes.  not interested in pills, worried about chantix side effects  . Alcohol Use: 0.0 oz/week    0 Standard drinks or equivalent per week  . Drug Use: No  . Sexual Activity: Not on file   Other Topics Concern  . Not on file   Social History Narrative    Current Outpatient  Prescriptions on File Prior to Visit  Medication Sig Dispense Refill  . ACCU-CHEK FASTCLIX LANCETS MISC     . ACCU-CHEK SOFTCLIX LANCETS lancets Use as instructed 100 each 12  . acetaminophen (TYLENOL) 500 MG tablet Take 2 tablets (1,000 mg total) by mouth every 8 (eight) hours as needed. 60 tablet 0  . albuterol (PROVENTIL HFA;VENTOLIN HFA) 108 (90 Base) MCG/ACT inhaler Inhale 2 puffs into the lungs every 6 (six) hours as needed for wheezing or shortness of breath. 1 Inhaler 0  . Alcohol Swabs (B-D SINGLE USE SWABS REGULAR) PADS     . aspirin EC 81 MG tablet Take 81 mg by mouth daily.    . BD INSULIN SYRINGE ULTRAFINE 31G X 5/16" 0.3 ML MISC     . Blood Glucose Calibration (ACCU-CHEK SMARTVIEW CONTROL) LIQD     . Blood Glucose Monitoring Suppl (ACCU-CHEK AVIVA) device Use as instructed to check blood sugar 3 times per day 1 each 0  . furosemide (LASIX) 20 MG tablet TAKE 1 TABLET BY MOUTH EVERY DAY MAY TAKE UP TO 2 PER DAY 30 tablet 2  . glucose blood (ACCU-CHEK AVIVA) test strip Use as instructed 100  each 12  . ibuprofen (ADVIL,MOTRIN) 200 MG tablet Take 400 mg by mouth every 6 (six) hours as needed for moderate pain.     [name] imiquimod (ALDARA) 5 % cream Reported on 07/27/2015  0  . meloxicam (MOBIC) 7.5 MG tablet Take 1 tablet (7.5 mg total) by mouth daily. 30 tablet 0  . methocarbamol (ROBAXIN) 750 MG tablet Take 1 tablet (750 mg total) by mouth every 6 (six) hours as needed for muscle spasms (and pain). 20 tablet 0  . naproxen (NAPROSYN) 500 MG tablet Take 1 tablet (500 mg total) by mouth 2 (two) times daily with a meal. 28 tablet 0  . quinapril (ACCUPRIL) 5 MG tablet Take 0.5 tablets (2.5 mg total) by mouth daily. 30 tablet 11  . rosuvastatin (CRESTOR) 20 MG tablet Take 1 tablet (20 mg total) by mouth daily. 90 tablet 3  . sildenafil (VIAGRA) 100 MG tablet TAKE 1 TABLET 1 HOUR BEFORE SEXUAL RELATIONS 5 tablet 0  . traMADol (ULTRAM) 50 MG tablet Take 1 tablet (50 mg total) by mouth every 8  (eight) hours as needed. 30 tablet 0  . zoster vaccine live, PF, (ZOSTAVAX) 09811 UNT/0.65ML injection Inject 19,400 Units into the skin once. 1 each 0  . chlorhexidine (PERIDEX) 0.12 % solution Use as directed 10 mLs in the mouth or throat 4 (four) times daily. 120 mL 0  . HYDROcodone-acetaminophen (NORCO) 5-325 MG tablet Take 1 tablet by mouth every 6 (six) hours as needed for moderate pain. 15 tablet 0  . [DISCONTINUED] levofloxacin (LEVAQUIN) 750 MG tablet Take 1 tablet (750 mg total) by mouth daily. For 14 days 14 tablet 0   No current facility-administered medications on file prior to visit.    No Known Allergies  Family History  Problem Relation Age of Onset  . Diabetes Mother   . Stroke Mother   . Heart failure Father     BP 121/62 mmHg  Pulse 61  Temp(Src) 98.3 F (36.8 C) (Oral)  Ht 5\' 10"  (1.778 m)  Wt 170 lb 9.6 oz (77.384 kg)  BMI 24.48 kg/m2  SpO2 97%  Review of Systems denies weight loss, headache, chest pain, sob, n/v, urinary frequency, excessive diaphoresis, memory loss, depression, cold intolerance, rhinorrhea, and easy bruising.  He has chronically blurry vision and lege cramps.      Objective:   Physical Exam VS: see vs page GEN: no distress HEAD: head: no deformity eyes: no periorbital swelling, no proptosis external nose and ears are normal mouth: no lesion seen NECK: supple, thyroid is not enlarged CHEST WALL: no deformity LUNGS: clear to auscultation CV: reg rate and rhythm, no murmur ABD: abdomen is soft, nontender.  no hepatosplenomegaly.  not distended.  no hernia.  MUSCULOSKELETAL: muscle bulk and strength are grossly normal.  no obvious joint swelling.  gait is normal and steady EXTEMITIES: right great toe is surgically absent.  no ulcer on the feet.  feet are of normal color and temp.  2+ right leg and 1+ left leg edema.  There is a heavy callus at the plantar aspect of the right foot (sees podiatry).   PULSES: dorsalis pedis absent bilat.   no carotid bruit NEURO:  cn 2-12 grossly intact.   readily moves all 4's.  sensation is intact to touch on the feet SKIN:  Normal texture and temperature.  No rash or suspicious lesion is visible.   NODES:  None palpable at the neck PSYCH: alert, well-oriented.  Does not appear anxious nor depressed.  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     Assessment & Plan:  Insulin-requiring type 2 DM, new to me: he needs increased rx.  Noncompliance with insulin: he is taking insuln wrong--he needs a simpler regimen.   Patient is advised the following: Patient Instructions  good diet and exercise significantly improve the control of your diabetes.  please let me know if you wish to be referred to a dietician.  high blood sugar is very risky to your health.  you should see an eye doctor and dentist every year.  It is very important to get all recommended vaccinations.  controlling your blood pressure and cholesterol drastically reduces the damage diabetes does to your body.  Those who smoke should quit.  please discuss these with your doctor.  check your blood sugar twice a day.  vary the time of day when you check, between before the 3 meals, and at bedtime.  also check if you have symptoms of your blood sugar being too high or too low.  please keep a record of the readings and bring it to your next appointment here (or you can bring the meter itself).  You can write it on any piece of paper.  please call us sooner if your blood sugar goes below 70, or if you have a lot of readings over 200. For now, please: Stop taking the trulicity and metformin, and: Take lantus, 70 units at bedtime, and: Increase the novolog to 30  units 3 times a day (just before each meal).   Please call us next week, to tell us how the blood sugar is doing.   Please come back for a follow-up appointment in 6 weeks.     Renato Shin, MD

## 2015-12-09 LAB — MICROALBUMIN / CREATININE URINE RATIO
Creatinine, Urine: 114.4 mg/dL
MICROALB/CREAT RATIO: 3.8 mg/g creat (ref 0.0–30.0)
Microalbumin, Urine: 4.3 ug/mL

## 2016-01-10 ENCOUNTER — Ambulatory Visit: Payer: Commercial Managed Care - HMO | Admitting: *Deleted

## 2016-01-10 DIAGNOSIS — E1149 Type 2 diabetes mellitus with other diabetic neurological complication: Secondary | ICD-10-CM

## 2016-01-10 NOTE — Progress Notes (Signed)
Patient ID: Adam Ray, male   DOB: 02-11-1956, 60 y.o.   MRN: [id number] Patient presents to be measured for diabetic shoes and inserts, box molds were used to give location of unload and placement of great toe filler right.

## 2016-01-19 ENCOUNTER — Ambulatory Visit: Payer: Commercial Managed Care - HMO | Admitting: Endocrinology

## 2016-01-19 DIAGNOSIS — Z0289 Encounter for other administrative examinations: Secondary | ICD-10-CM

## 2016-01-28 ENCOUNTER — Encounter (HOSPITAL_COMMUNITY): Payer: Self-pay | Admitting: Emergency Medicine

## 2016-01-28 ENCOUNTER — Inpatient Hospital Stay (HOSPITAL_COMMUNITY)
Admission: EM | Admit: 2016-01-28 | Discharge: 2016-02-01 | DRG: 617 | Disposition: A | Discharge status: Home Health | Payer: Commercial Managed Care - HMO | Attending: Family Medicine | Admitting: Family Medicine

## 2016-01-28 ENCOUNTER — Ambulatory Visit (HOSPITAL_COMMUNITY)
Admission: EM | Admit: 2016-01-28 | Discharge: 2016-01-28 | Disposition: A | Discharge status: Nursing Home (Includes ICF) | Payer: Commercial Managed Care - HMO | Attending: Emergency Medicine | Admitting: Emergency Medicine

## 2016-01-28 DIAGNOSIS — L97519 Non-pressure chronic ulcer of other part of right foot with unspecified severity: Secondary | ICD-10-CM | POA: Diagnosis present

## 2016-01-28 DIAGNOSIS — F329 Major depressive disorder, single episode, unspecified: Secondary | ICD-10-CM | POA: Diagnosis present

## 2016-01-28 DIAGNOSIS — I739 Peripheral vascular disease, unspecified: Secondary | ICD-10-CM

## 2016-01-28 DIAGNOSIS — Z8249 Family history of ischemic heart disease and other diseases of the circulatory system: Secondary | ICD-10-CM

## 2016-01-28 DIAGNOSIS — E1169 Type 2 diabetes mellitus with other specified complication: Secondary | ICD-10-CM | POA: Diagnosis not present

## 2016-01-28 DIAGNOSIS — R0989 Other specified symptoms and signs involving the circulatory and respiratory systems: Secondary | ICD-10-CM

## 2016-01-28 DIAGNOSIS — R229 Localized swelling, mass and lump, unspecified: Secondary | ICD-10-CM

## 2016-01-28 DIAGNOSIS — M86271 Subacute osteomyelitis, right ankle and foot: Secondary | ICD-10-CM | POA: Diagnosis present

## 2016-01-28 DIAGNOSIS — Q231 Congenital insufficiency of aortic valve: Secondary | ICD-10-CM

## 2016-01-28 DIAGNOSIS — Z89411 Acquired absence of right great toe: Secondary | ICD-10-CM

## 2016-01-28 DIAGNOSIS — R001 Bradycardia, unspecified: Secondary | ICD-10-CM | POA: Diagnosis not present

## 2016-01-28 DIAGNOSIS — Z7982 Long term (current) use of aspirin: Secondary | ICD-10-CM

## 2016-01-28 DIAGNOSIS — L03115 Cellulitis of right lower limb: Secondary | ICD-10-CM | POA: Diagnosis present

## 2016-01-28 DIAGNOSIS — M86671 Other chronic osteomyelitis, right ankle and foot: Secondary | ICD-10-CM | POA: Diagnosis present

## 2016-01-28 DIAGNOSIS — E11621 Type 2 diabetes mellitus with foot ulcer: Secondary | ICD-10-CM | POA: Diagnosis present

## 2016-01-28 DIAGNOSIS — F17200 Nicotine dependence, unspecified, uncomplicated: Secondary | ICD-10-CM | POA: Diagnosis present

## 2016-01-28 DIAGNOSIS — B370 Candidal stomatitis: Secondary | ICD-10-CM | POA: Diagnosis present

## 2016-01-28 DIAGNOSIS — E0842 Diabetes mellitus due to underlying condition with diabetic polyneuropathy: Secondary | ICD-10-CM

## 2016-01-28 DIAGNOSIS — J449 Chronic obstructive pulmonary disease, unspecified: Secondary | ICD-10-CM | POA: Diagnosis present

## 2016-01-28 DIAGNOSIS — Z79899 Other long term (current) drug therapy: Secondary | ICD-10-CM

## 2016-01-28 DIAGNOSIS — E1151 Type 2 diabetes mellitus with diabetic peripheral angiopathy without gangrene: Secondary | ICD-10-CM | POA: Diagnosis present

## 2016-01-28 DIAGNOSIS — D573 Sickle-cell trait: Secondary | ICD-10-CM | POA: Diagnosis present

## 2016-01-28 DIAGNOSIS — M869 Osteomyelitis, unspecified: Secondary | ICD-10-CM | POA: Diagnosis present

## 2016-01-28 DIAGNOSIS — E785 Hyperlipidemia, unspecified: Secondary | ICD-10-CM | POA: Diagnosis present

## 2016-01-28 DIAGNOSIS — I872 Venous insufficiency (chronic) (peripheral): Secondary | ICD-10-CM | POA: Diagnosis present

## 2016-01-28 DIAGNOSIS — I10 Essential (primary) hypertension: Secondary | ICD-10-CM | POA: Diagnosis present

## 2016-01-28 DIAGNOSIS — Z794 Long term (current) use of insulin: Secondary | ICD-10-CM

## 2016-01-28 DIAGNOSIS — E1165 Type 2 diabetes mellitus with hyperglycemia: Secondary | ICD-10-CM | POA: Diagnosis present

## 2016-01-28 DIAGNOSIS — R911 Solitary pulmonary nodule: Secondary | ICD-10-CM | POA: Diagnosis present

## 2016-01-28 DIAGNOSIS — E1142 Type 2 diabetes mellitus with diabetic polyneuropathy: Secondary | ICD-10-CM

## 2016-01-28 DIAGNOSIS — Z833 Family history of diabetes mellitus: Secondary | ICD-10-CM

## 2016-01-28 DIAGNOSIS — M109 Gout, unspecified: Secondary | ICD-10-CM | POA: Diagnosis present

## 2016-01-28 LAB — COMPREHENSIVE METABOLIC PANEL
ALK PHOS: 129 U/L — AB (ref 38–126)
ALT: 19 U/L (ref 17–63)
ANION GAP: 7 (ref 5–15)
AST: 28 U/L (ref 15–41)
Albumin: 3.6 g/dL (ref 3.5–5.0)
BILIRUBIN TOTAL: 0.8 mg/dL (ref 0.3–1.2)
BUN: 13 mg/dL (ref 6–20)
CALCIUM: 9 mg/dL (ref 8.9–10.3)
CO2: 27 mmol/L (ref 22–32)
CREATININE: 1.24 mg/dL (ref 0.61–1.24)
Chloride: 101 mmol/L (ref 101–111)
GFR calc non Af Amer: >60 mL/min (ref >60)
Glucose, Bld: 361 mg/dL — ABNORMAL HIGH (ref 65–99)
Potassium: 4.7 mmol/L (ref 3.5–5.1)
Sodium: 135 mmol/L (ref 135–145)
TOTAL PROTEIN: 5.8 g/dL — AB (ref 6.5–8.1)

## 2016-01-28 LAB — CBC WITH DIFFERENTIAL/PLATELET
Basophils Absolute: 0 10*3/uL (ref 0.0–0.1)
Basophils Relative: 1 %
Eosinophils Absolute: 0.2 10*3/uL (ref 0.0–0.7)
Eosinophils Relative: 4 %
HEMATOCRIT: 40.9 % (ref 39.0–52.0)
HEMOGLOBIN: 13.4 g/dL (ref 13.0–17.0)
LYMPHS ABS: 1.7 10*3/uL (ref 0.7–4.0)
LYMPHS PCT: 42 %
MCH: 29.4 pg (ref 26.0–34.0)
MCHC: 32.8 g/dL (ref 30.0–36.0)
MCV: 89.7 fL (ref 78.0–100.0)
MONOS PCT: 10 %
Monocytes Absolute: 0.4 10*3/uL (ref 0.1–1.0)
NEUTROS ABS: 1.6 10*3/uL — AB (ref 1.7–7.7)
NEUTROS PCT: 43 %
Platelets: 134 10*3/uL — ABNORMAL LOW (ref 150–400)
RBC: 4.56 MIL/uL (ref 4.22–5.81)
RDW: 13.3 % (ref 11.5–15.5)
WBC: 3.8 10*3/uL — ABNORMAL LOW (ref 4.0–10.5)

## 2016-01-28 LAB — I-STAT CG4 LACTIC ACID, ED: Lactic Acid, Venous: 1.47 mmol/L (ref 0.5–1.9)

## 2016-01-28 NOTE — ED Provider Notes (Signed)
CSN: [id number]     Arrival date & time 01/28/16  1834 History   None    Chief Complaint  Patient presents with  . Wound Infection   (Consider location/radiation/quality/duration/timing/severity/associated sxs/prior Treatment)  HPI   Patient is a 60 year old male who is presenting today with right lower extremity swelling and discomfort which started yesterday. Patient is an insulin-dependent diabetic. He has 2 toes on his right foot which he wrapped himself this morning in order to go to church which do appear to have a purulent green drainage.  Past Medical History:  Diagnosis Date  . Allergy   . Arthritis   . Bronchitis   . Cataract   . Chronic kidney disease   . Claudication [facility])    right foot ray resection  . Colon polyps    hyperplastic  . COPD (chronic obstructive pulmonary disease) ([place])   . Diabetes mellitus   . Genital warts   . Gout    Past Surgical History:  Procedure Laterality Date  . I&D EXTREMITY  04/11/2012   Procedure: IRRIGATION AND DEBRIDEMENT EXTREMITY;  Surgeon: Wylene Simmer, MD;  Location: [place];  Service: Orthopedics;  Laterality: Right;  . Surgery left great toe    . Tear ducts bilateral eyes     Family History  Problem Relation Age of Onset  . Diabetes Mother   . Stroke Mother   . Heart failure Father    Social History  Substance Use Topics  . Smoking status: Former Smoker    Packs/day: 0.30    Years: 48.00    Types: Cigars    Start date: 07/02/1963  . Smokeless tobacco: Former Systems developer     Comment: wants to use electric cigarettes.  not interested in pills, worried about chantix side effects  . Alcohol use 0.0 oz/week    Review of Systems  Constitutional: Negative.  Negative for fatigue and fever.  HENT: Negative.   Eyes: Negative.   Respiratory: Negative.  Negative for chest tightness and shortness of breath.   Cardiovascular: Positive for leg swelling.  Gastrointestinal: Negative.   Endocrine: Negative.   Genitourinary: Negative.    Musculoskeletal: Negative.   Skin: Positive for wound.       Oozing from legs.   Allergic/Immunologic: Negative.   Neurological: Negative.   Hematological: Negative.   Psychiatric/Behavioral: Negative.     Allergies  Review of patient's allergies indicates no known allergies.  Home Medications   Prior to Admission medications   Medication Sig Start Date End Date Taking? Authorizing Provider  ACCU-CHEK FASTCLIX LANCETS [place]  12/30/14  Yes Historical Provider, MD  ACCU-CHEK SOFTCLIX LANCETS lancets Use as instructed 11/16/15  Yes [name], MD  Alcohol Swabs (B-D SINGLE USE SWABS REGULAR) PADS  12/30/14  Yes Historical Provider, MD  aspirin EC 81 MG tablet Take 81 mg by mouth daily.   Yes Historical Provider, MD  BD INSULIN SYRINGE ULTRAFINE 31G X 5/16" 0.3 ML MISC  12/30/14  Yes Historical Provider, MD  Blood Glucose Calibration (ACCU-CHEK SMARTVIEW CONTROL) LIQD  12/30/14  Yes Historical Provider, MD  Blood Glucose Monitoring Suppl (ACCU-CHEK AVIVA) device Use as instructed to check blood sugar 3 times per day 11/15/15 11/14/16 Yes [name], MD  chlorhexidine (PERIDEX) 0.12 % solution Use as directed 10 mLs in the mouth or throat 4 (four) times daily. 07/27/15  Yes [name], MD  furosemide (LASIX) 20 MG tablet TAKE 1 TABLET BY MOUTH EVERY DAY MAY TAKE UP TO 2 PER DAY  07/27/15  Yes [name], MD  glucose blood (ACCU-CHEK AVIVA) test strip Use as instructed 11/16/15  Yes [name], MD  insulin aspart (NOVOLOG) 100 UNIT/ML injection Inject 30 Units into the skin 3 (three) times daily before meals. And pen needles 4/day 12/08/15  Yes Renato Shin, MD  insulin glargine (LANTUS) 100 UNIT/ML injection Inject 0.7 mLs (70 Units total) into the skin at bedtime. 12/08/15  Yes Renato Shin, MD  rosuvastatin (CRESTOR) 20 MG tablet Take 1 tablet (20 mg total) by mouth daily. 07/27/15  Yes [name], MD  acetaminophen (TYLENOL) 500 MG tablet Take 2 tablets (1,000 mg  total) by mouth every 8 (eight) hours as needed. 10/02/15   [name], MD  albuterol (PROVENTIL HFA;VENTOLIN HFA) 108 (90 Base) MCG/ACT inhaler Inhale 2 puffs into the lungs every 6 (six) hours as needed for wheezing or shortness of breath. 07/27/15   [name], MD  HYDROcodone-acetaminophen (NORCO) 5-325 MG tablet Take 1 tablet by mouth every 6 (six) hours as needed for moderate pain. 08/11/15   Melony Overly, MD  ibuprofen (ADVIL,MOTRIN) 200 MG tablet Take 400 mg by mouth every 6 (six) hours as needed for moderate pain.     Historical Provider, MD  imiquimod [place]) 5 % cream Reported on 07/27/2015 12/29/14   Historical Provider, MD  meloxicam (MOBIC) 7.5 MG tablet Take 1 tablet (7.5 mg total) by mouth daily. 08/11/15   Melony Overly, MD  methocarbamol (ROBAXIN) 750 MG tablet Take 1 tablet (750 mg total) by mouth every 6 (six) hours as needed for muscle spasms (and pain). 11/08/14   Clayton Bibles, PA-C  naproxen (NAPROSYN) 500 MG tablet Take 1 tablet (500 mg total) by mouth 2 (two) times daily with a meal. 08/10/15   Rosemarie Ax, MD  quinapril (ACCUPRIL) 5 MG tablet Take 0.5 tablets (2.5 mg total) by mouth daily. 07/27/15   [name], MD  sildenafil (VIAGRA) 100 MG tablet TAKE 1 TABLET 1 HOUR BEFORE SEXUAL RELATIONS 05/08/15   [name], MD  traMADol (ULTRAM) 50 MG tablet Take 1 tablet (50 mg total) by mouth every 8 (eight) hours as needed. 11/15/15   [name], MD  zoster vaccine live, PF, (ZOSTAVAX) 09811 UNT/0.65ML injection Inject 19,400 Units into the skin once. 09/15/15   [name], MD   Meds Ordered and Administered this Visit  Medications - No data to display  BP 121/74 (BP Location: Left Arm)   Pulse 65   Temp 98.1 F (36.7 C) (Oral)   Resp 18   SpO2 97%  No data found.   Physical Exam  Constitutional: He appears well-developed and well-nourished. No distress.  Cardiovascular: Normal rate, regular rhythm and intact distal pulses.  Exam  reveals no gallop and no friction rub.   Murmur heard. Patient has grade 3/6 systolic murmur best heard over pulmonic area.  Pulmonary/Chest: Effort normal and breath sounds normal. No respiratory distress. He has no wheezes. He has no rales. He exhibits no tenderness.  Skin: Skin is warm and dry. He is not diaphoretic.  Nursing note and vitals reviewed.   Urgent Care Course   Clinical Course    Procedures (including critical care time)  Labs Review Labs Reviewed - No data to display  Imaging Review No results found.    MDM   1. Cellulitis of right lower extremity    Transfer to [facility] ED for further evaluation.      Barnetta Chapel  Marcy Siren, NP 01/28/16 2006

## 2016-01-28 NOTE — ED Triage Notes (Signed)
Pt. reports right lower leg swelling/edema and right 2nd toe infection with drainage onset this week , denies fever or chills.

## 2016-01-28 NOTE — ED Notes (Signed)
Patient report called to Verne Carrow RN at [facility] ED

## 2016-01-28 NOTE — ED Provider Notes (Signed)
[facility] DEPT Provider Note   CSN: [id number] Arrival date & time: 01/28/16  2019   First MD Initiated Contact with Patient 01/28/16 2358     By signing my name below, I, Jasmyn B. Alexander, attest that this documentation has been prepared under the direction and in the presence of Merryl Hacker, MD.  Electronically Signed: Tedra Coupe. Sheppard Coil, ED Scribe. 01/28/16. 12:06 AM.   History   Chief Complaint Chief Complaint  Patient presents with  . Cellulitis    HPI HPI Comments: Adam Ray is a 60 y.o. male with PMHx of DM and Gout who presents to the Emergency Department complaining of gradual worsening, constant, right lower extremity swelling x 3 days. Pt has associated drainage of the right lower leg. Pt reports having a right second toe infection. Pt does not have any pain to the area. He has had his right great toe amputated previously with similar symptoms. Denies any recent long-distance travel or hospitalization. Denies any fever, chills, chest pain, or shortness of breath. Pt is a current smoker.  Denies recent long travel.  The history is provided by the patient. No language interpreter was used.   Past Medical History:  Diagnosis Date  . Allergy   . Arthritis   . Bronchitis   . Cataract   . Chronic kidney disease   . Claudication [facility])    right foot ray resection  . Colon polyps    hyperplastic  . COPD (chronic obstructive pulmonary disease) ([place])   . Diabetes mellitus   . Genital warts   . Gout     Patient Active Problem List   Diagnosis Date Noted  . Osteomyelitis ([place]) 01/29/2016  . Diabetes ([place]) 12/08/2015  . Left shoulder pain 08/10/2015  . Pain in joint, shoulder region 02/21/2015  . Pulmonary nodule 02/01/2015  . Screening for colon cancer 01/09/2015  . URI (upper respiratory infection) 04/29/2014  . Lumbar back pain 03/21/2014  . Rash and nonspecific skin eruption 12/22/2013  . Screening for STD (sexually transmitted disease)  12/15/2013  . Depression 12/09/2013  . Lightheadedness 11/19/2013  . Genital warts 08/20/2013  . 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 Right leg pain 04/27/2013  . Diabetic retinopathy ([place]) 01/28/2013  . Visit for well man health check 11/19/2010  . Sleep apnea 09/06/2010  . 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 LEG EDEMA 03/03/2009  . Essential hypertension, benign 11/09/2008  . COPD, mild ([place]) 10/06/2006  . SICKLE-CELL TRAIT 04/26/2006  . ERECTILE DYSFUNCTION 04/26/2006  . TOBACCO ABUSE 04/26/2006  . VENOUS INSUFFICIENCY 04/26/2006  . ALLERGIC RHINITIS 04/26/2006    Past Surgical History:  Procedure Laterality Date  . I&D EXTREMITY  04/11/2012   Procedure: IRRIGATION AND DEBRIDEMENT EXTREMITY;  Surgeon: Wylene Simmer, MD;  Location: [place];  Service: Orthopedics;  Laterality: Right;  . Surgery left great toe    . Tear ducts bilateral eyes       Home Medications    Prior to Admission medications   Medication Sig Start Date End Date Taking? Authorizing Provider  acetaminophen (TYLENOL) 500 MG tablet Take 2 tablets (1,000 mg total) by mouth every 8 (eight) hours as needed. 10/02/15  Yes [name], MD  aspirin EC 81 MG tablet Take 81 mg by mouth daily.   Yes Historical Provider, MD  insulin aspart (NOVOLOG) 100 UNIT/ML injection Inject 30 Units into the skin 3 (three) times daily before meals. And pen needles 4/day Patient taking differently: Inject 25-40 Units into the skin  3 (three) times daily before meals. Sliding scale 12/08/15  Yes Renato Shin, MD  insulin glargine (LANTUS) 100 UNIT/ML injection Inject 0.7 mLs (70 Units total) into the skin at bedtime. Patient taking differently: Inject 100 Units into the skin daily.  12/08/15  Yes Renato Shin, MD  quinapril (ACCUPRIL) 5 MG tablet Take 0.5 tablets (2.5 mg total) by mouth daily. 07/27/15  Yes [name], MD  rosuvastatin (CRESTOR) 20 MG tablet Take 20 mg by mouth daily. 01/21/16  Yes  Historical Provider, MD  ACCU-CHEK FASTCLIX LANCETS [place]  12/30/14   Historical Provider, MD  ACCU-CHEK SOFTCLIX LANCETS lancets Use as instructed 11/16/15   [name], MD  albuterol (PROVENTIL HFA;VENTOLIN HFA) 108 (90 Base) MCG/ACT inhaler Inhale 2 puffs into the lungs every 6 (six) hours as needed for wheezing or shortness of breath. Patient not taking: Reported on 01/29/2016 07/27/15   [name], MD  Alcohol Swabs (B-D SINGLE USE SWABS REGULAR) PADS  12/30/14   Historical Provider, MD  BD INSULIN SYRINGE ULTRAFINE 31G X 5/16" 0.3 ML MISC  12/30/14   Historical Provider, MD  Blood Glucose Calibration (ACCU-CHEK SMARTVIEW CONTROL) LIQD  12/30/14   Historical Provider, MD  Blood Glucose Monitoring Suppl (ACCU-CHEK AVIVA) device Use as instructed to check blood sugar 3 times per day 11/15/15 11/14/16  [name], MD  chlorhexidine (PERIDEX) 0.12 % solution Use as directed 10 mLs in the mouth or throat 4 (four) times daily. Patient not taking: Reported on 01/29/2016 07/27/15   [name], MD  furosemide (LASIX) 20 MG tablet TAKE 1 TABLET BY MOUTH EVERY DAY MAY TAKE UP TO 2 PER DAY Patient not taking: Reported on 01/29/2016 07/27/15   [name], MD  glucose blood (ACCU-CHEK AVIVA) test strip Use as instructed 11/16/15   [name], MD  HYDROcodone-acetaminophen (NORCO) 5-325 MG tablet Take 1 tablet by mouth every 6 (six) hours as needed for moderate pain. Patient not taking: Reported on 01/29/2016 08/11/15   Melony Overly, MD  meloxicam (MOBIC) 7.5 MG tablet Take 1 tablet (7.5 mg total) by mouth daily. Patient not taking: Reported on 01/29/2016 08/11/15   Melony Overly, MD  methocarbamol (ROBAXIN) 750 MG tablet Take 1 tablet (750 mg total) by mouth every 6 (six) hours as needed for muscle spasms (and pain). Patient not taking: Reported on 01/29/2016 11/08/14   Clayton Bibles, PA-C  naproxen (NAPROSYN) 500 MG tablet Take 1 tablet (500 mg total) by mouth 2 (two) times daily with a  meal. Patient not taking: Reported on 01/29/2016 08/10/15   Rosemarie Ax, MD  sildenafil (VIAGRA) 100 MG tablet TAKE 1 TABLET 1 HOUR BEFORE SEXUAL RELATIONS Patient not taking: Reported on 01/29/2016 05/08/15   [name], MD  traMADol (ULTRAM) 50 MG tablet Take 1 tablet (50 mg total) by mouth every 8 (eight) hours as needed. Patient not taking: Reported on 01/29/2016 11/15/15   [name], MD  zoster vaccine live, PF, (ZOSTAVAX) 16109 UNT/0.65ML injection Inject 19,400 Units into the skin once. Patient not taking: Reported on 01/29/2016 09/15/15   [name], MD    Family History Family History  Problem Relation Age of Onset  . Diabetes Mother   . Stroke Mother   . Heart failure Father     Social History Social History  Substance Use Topics  . Smoking status: Former Smoker    Packs/day: 0.30    Years: 48.00    Types: Cigars  Start date: 07/02/1963  . Smokeless tobacco: Former Systems developer     Comment: wants to use electric cigarettes.  not interested in pills, worried about chantix side effects  . Alcohol use 0.0 oz/week     Allergies   Review of patient's allergies indicates no known allergies.   Review of Systems Review of Systems  Constitutional: Negative for chills and fever.  Respiratory: Negative for shortness of breath.   Cardiovascular: Positive for leg swelling. Negative for chest pain.  Gastrointestinal: Negative for abdominal pain.  Skin: Positive for color change. Negative for wound.  All other systems reviewed and are negative.  Physical Exam Updated Vital Signs BP 139/79   Pulse (S) (!) 54 Comment: Right Foot  Temp 98 F (36.7 C) (Oral)   Resp 18   Ht 5\' 10"  (1.778 m)   Wt 171 lb (77.6 kg)   SpO2 99%   BMI 24.54 kg/m   Physical Exam  Constitutional: He is oriented to person, place, and time. He appears well-developed and well-nourished.  Smells of smoke  HENT:  Head: Normocephalic and atraumatic.  Cardiovascular: Normal rate,  regular rhythm and normal heart sounds.   No murmur heard. Pulmonary/Chest: Effort normal and breath sounds normal. No respiratory distress.  Abdominal: Soft. Bowel sounds are normal. There is no tenderness. There is no rebound.  Musculoskeletal: He exhibits edema.  Extensive right lower extremity swelling with tense using noted lower leg, mild erythema noted, absent hair, unable to palpate pulses, amputated right first digit, bleeding and tenderness palpation noted over the right second digit nail bed  Lymphadenopathy:    He has no cervical adenopathy.  Neurological: He is alert and oriented to person, place, and time.  Skin: Skin is warm. There is erythema.  Psychiatric: He has a normal mood and affect.  Nursing note and vitals reviewed.    ED Treatments / Results  DIAGNOSTIC STUDIES: Oxygen Saturation is 100% on RA, normal by my interpretation.    COORDINATION OF CARE: 12:03 AM-Discussed treatment plan which includes Lactic Acid and order of Vancomycin with pt at bedside and pt agreed to plan.   Labs (all labs ordered are listed, but only abnormal results are displayed) Labs Reviewed  CBC WITH DIFFERENTIAL/PLATELET - Abnormal; Notable for the following:       Result Value   WBC 3.8 (*)    Platelets 134 (*)    Neutro Abs 1.6 (*)    All other components within normal limits  COMPREHENSIVE METABOLIC PANEL - Abnormal; Notable for the following:    Glucose, Bld 361 (*)    Total Protein 5.8 (*)    Alkaline Phosphatase 129 (*)    All other components within normal limits  CULTURE, BLOOD (ROUTINE X 2)  CULTURE, BLOOD (ROUTINE X 2)  I-STAT CG4 LACTIC ACID, ED  I-STAT CG4 LACTIC ACID, ED    EKG  EKG Interpretation None       Radiology Dg Foot Complete Right  Result Date: 01/29/2016 CLINICAL DATA:  RIGHT leg swelling and second toe infection for 1 week. History of diabetes, gout. EXAM: RIGHT FOOT COMPLETE - 3+ VIEW COMPARISON:  RIGHT foot radiograph March 01, 2004  FINDINGS: Erosion, sclerosis, flattening of the second distal phalanx tuft associated with apparent ulceration and packing material. No acute fracture deformity or dislocation. Status post first digit amputation. Pes planus on nonweightbearing radiographs. Diffuse soft tissue swelling. No radiopaque foreign bodies. IMPRESSION: Suspected chronic osteomyelitis second distal phalanx with overlying ulceration. Findings could be confirmed with MRI  as clinically indicated. Diffuse soft tissue swelling. Electronically Signed   By: Elon Alas M.D.   On: 01/29/2016 00:45   Procedures Procedures (including critical care time)  Medications Ordered in ED Medications  vancomycin (VANCOCIN) IVPB 1000 mg/200 mL premix (1,000 mg Intravenous New Bag/Given 01/29/16 0141)  sodium chloride 0.9 % bolus 1,000 mL (1,000 mLs Intravenous New Bag/Given 01/29/16 0143)   Initial Impression / Assessment and Plan / ED Course  I have reviewed the triage vital signs and the nursing notes.  Pertinent labs & imaging results that were available during my care of the patient were reviewed by me and considered in my medical decision making (see chart for details).  Clinical Course    She presents with concern for infection and swelling of the right lower extremity. 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 Denies systemic symptoms including fevers. He has extensive swelling of the right lower extremity. Right second toe has some mild bleeding and oozing. No evidence of necrosis. Dopplerable pulses by nursing. He has no peripheral vascular disease. No leukocytosis. Afebrile. Patient was given IV vancomycin with concern for infection. Plain films are suspicious for chronic osteomyelitis. No recent films noted in his electronic medical record. Patient needs an MRI. He likely also needs an ultrasound of his lower extremities rule out DVT. He is a family practice patient. Discussed with the resident. Will admit.  Final  Clinical Impressions(s) / ED Diagnoses   Final diagnoses:  Subacute osteomyelitis of right foot ([place])    New Prescriptions New Prescriptions   No medications on file   I personally performed the services described in this documentation, which was scribed in my presence. The recorded information has been reviewed and is accurate.     Merryl Hacker, MD 01/29/16 [phone]

## 2016-01-28 NOTE — ED Triage Notes (Signed)
The patient presented to the [facility] with a complaint of a possible infected 2nd toe on his right foot for 3 days. The patient stated that he felt his shoe could have irritated the toe initially. 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

## 2016-01-29 ENCOUNTER — Observation Stay (HOSPITAL_COMMUNITY): Payer: Commercial Managed Care - HMO

## 2016-01-29 ENCOUNTER — Encounter (HOSPITAL_COMMUNITY): Payer: Commercial Managed Care - HMO

## 2016-01-29 ENCOUNTER — Emergency Department (HOSPITAL_COMMUNITY): Payer: Commercial Managed Care - HMO

## 2016-01-29 DIAGNOSIS — E1142 Type 2 diabetes mellitus with diabetic polyneuropathy: Secondary | ICD-10-CM

## 2016-01-29 DIAGNOSIS — M7989 Other specified soft tissue disorders: Secondary | ICD-10-CM | POA: Diagnosis not present

## 2016-01-29 DIAGNOSIS — M25571 Pain in right ankle and joints of right foot: Secondary | ICD-10-CM | POA: Diagnosis not present

## 2016-01-29 DIAGNOSIS — M86271 Subacute osteomyelitis, right ankle and foot: Secondary | ICD-10-CM | POA: Diagnosis present

## 2016-01-29 DIAGNOSIS — E785 Hyperlipidemia, unspecified: Secondary | ICD-10-CM | POA: Diagnosis present

## 2016-01-29 DIAGNOSIS — B370 Candidal stomatitis: Secondary | ICD-10-CM | POA: Diagnosis not present

## 2016-01-29 DIAGNOSIS — E1165 Type 2 diabetes mellitus with hyperglycemia: Secondary | ICD-10-CM | POA: Diagnosis not present

## 2016-01-29 DIAGNOSIS — R0989 Other specified symptoms and signs involving the circulatory and respiratory systems: Secondary | ICD-10-CM | POA: Diagnosis not present

## 2016-01-29 DIAGNOSIS — S91104A Unspecified open wound of right lesser toe(s) without damage to nail, initial encounter: Secondary | ICD-10-CM | POA: Diagnosis not present

## 2016-01-29 DIAGNOSIS — L97519 Non-pressure chronic ulcer of other part of right foot with unspecified severity: Secondary | ICD-10-CM | POA: Diagnosis present

## 2016-01-29 DIAGNOSIS — J449 Chronic obstructive pulmonary disease, unspecified: Secondary | ICD-10-CM | POA: Diagnosis not present

## 2016-01-29 DIAGNOSIS — E1149 Type 2 diabetes mellitus with other diabetic neurological complication: Secondary | ICD-10-CM | POA: Diagnosis not present

## 2016-01-29 DIAGNOSIS — M86272 Subacute osteomyelitis, left ankle and foot: Secondary | ICD-10-CM | POA: Diagnosis not present

## 2016-01-29 DIAGNOSIS — I10 Essential (primary) hypertension: Secondary | ICD-10-CM | POA: Diagnosis not present

## 2016-01-29 DIAGNOSIS — Q231 Congenital insufficiency of aortic valve: Secondary | ICD-10-CM | POA: Diagnosis not present

## 2016-01-29 DIAGNOSIS — R911 Solitary pulmonary nodule: Secondary | ICD-10-CM | POA: Diagnosis present

## 2016-01-29 DIAGNOSIS — R229 Localized swelling, mass and lump, unspecified: Secondary | ICD-10-CM | POA: Diagnosis not present

## 2016-01-29 DIAGNOSIS — E11621 Type 2 diabetes mellitus with foot ulcer: Secondary | ICD-10-CM | POA: Diagnosis not present

## 2016-01-29 DIAGNOSIS — Z7982 Long term (current) use of aspirin: Secondary | ICD-10-CM | POA: Diagnosis not present

## 2016-01-29 DIAGNOSIS — M1990 Unspecified osteoarthritis, unspecified site: Secondary | ICD-10-CM | POA: Diagnosis not present

## 2016-01-29 DIAGNOSIS — M86671 Other chronic osteomyelitis, right ankle and foot: Secondary | ICD-10-CM | POA: Diagnosis not present

## 2016-01-29 DIAGNOSIS — F329 Major depressive disorder, single episode, unspecified: Secondary | ICD-10-CM | POA: Diagnosis present

## 2016-01-29 DIAGNOSIS — M109 Gout, unspecified: Secondary | ICD-10-CM | POA: Diagnosis present

## 2016-01-29 DIAGNOSIS — E1169 Type 2 diabetes mellitus with other specified complication: Secondary | ICD-10-CM | POA: Diagnosis not present

## 2016-01-29 DIAGNOSIS — Z79899 Other long term (current) drug therapy: Secondary | ICD-10-CM | POA: Diagnosis not present

## 2016-01-29 DIAGNOSIS — F17200 Nicotine dependence, unspecified, uncomplicated: Secondary | ICD-10-CM | POA: Diagnosis present

## 2016-01-29 DIAGNOSIS — R001 Bradycardia, unspecified: Secondary | ICD-10-CM | POA: Diagnosis not present

## 2016-01-29 DIAGNOSIS — Z794 Long term (current) use of insulin: Secondary | ICD-10-CM | POA: Diagnosis not present

## 2016-01-29 DIAGNOSIS — M869 Osteomyelitis, unspecified: Secondary | ICD-10-CM | POA: Diagnosis present

## 2016-01-29 DIAGNOSIS — Z8249 Family history of ischemic heart disease and other diseases of the circulatory system: Secondary | ICD-10-CM | POA: Diagnosis not present

## 2016-01-29 DIAGNOSIS — E0842 Diabetes mellitus due to underlying condition with diabetic polyneuropathy: Secondary | ICD-10-CM | POA: Diagnosis not present

## 2016-01-29 DIAGNOSIS — I872 Venous insufficiency (chronic) (peripheral): Secondary | ICD-10-CM | POA: Diagnosis present

## 2016-01-29 DIAGNOSIS — L03115 Cellulitis of right lower limb: Secondary | ICD-10-CM | POA: Diagnosis not present

## 2016-01-29 DIAGNOSIS — D573 Sickle-cell trait: Secondary | ICD-10-CM | POA: Diagnosis present

## 2016-01-29 DIAGNOSIS — Z833 Family history of diabetes mellitus: Secondary | ICD-10-CM | POA: Diagnosis not present

## 2016-01-29 DIAGNOSIS — I739 Peripheral vascular disease, unspecified: Secondary | ICD-10-CM | POA: Diagnosis not present

## 2016-01-29 DIAGNOSIS — E1151 Type 2 diabetes mellitus with diabetic peripheral angiopathy without gangrene: Secondary | ICD-10-CM | POA: Diagnosis not present

## 2016-01-29 HISTORY — DX: Subacute osteomyelitis, right ankle and foot: M86.271

## 2016-01-29 LAB — CBC
HEMATOCRIT: 39.7 % (ref 39.0–52.0)
HEMOGLOBIN: 12.9 g/dL — AB (ref 13.0–17.0)
MCH: 29.1 pg (ref 26.0–34.0)
MCHC: 32.5 g/dL (ref 30.0–36.0)
MCV: 89.6 fL (ref 78.0–100.0)
Platelets: 119 10*3/uL — ABNORMAL LOW (ref 150–400)
RBC: 4.43 MIL/uL (ref 4.22–5.81)
RDW: 13.3 % (ref 11.5–15.5)
WBC: 3.1 10*3/uL — ABNORMAL LOW (ref 4.0–10.5)

## 2016-01-29 LAB — GLUCOSE, CAPILLARY
Glucose-Capillary: 323 mg/dL — ABNORMAL HIGH (ref 65–99)
Glucose-Capillary: 273 mg/dL — ABNORMAL HIGH (ref 65–99)
Glucose-Capillary: 221 mg/dL — ABNORMAL HIGH (ref 65–99)

## 2016-01-29 LAB — BASIC METABOLIC PANEL
ANION GAP: 4 — AB (ref 5–15)
BUN: 12 mg/dL (ref 6–20)
CHLORIDE: 101 mmol/L (ref 101–111)
CO2: 30 mmol/L (ref 22–32)
Calcium: 8.4 mg/dL — ABNORMAL LOW (ref 8.9–10.3)
Creatinine, Ser: 1.02 mg/dL (ref 0.61–1.24)
GFR calc Af Amer: >60 mL/min (ref >60)
GLUCOSE: 287 mg/dL — AB (ref 65–99)
POTASSIUM: 4.2 mmol/L (ref 3.5–5.1)
Sodium: 135 mmol/L (ref 135–145)

## 2016-01-29 LAB — C-REACTIVE PROTEIN: CRP: 2.6 mg/dL — ABNORMAL HIGH (ref <1.0)

## 2016-01-29 LAB — HIV ANTIBODY (ROUTINE TESTING W REFLEX): HIV Screen 4th Generation wRfx: NONREACTIVE

## 2016-01-29 LAB — I-STAT CG4 LACTIC ACID, ED: Lactic Acid, Venous: 1.64 mmol/L (ref 0.5–1.9)

## 2016-01-29 LAB — SAVE SMEAR

## 2016-01-29 LAB — SEDIMENTATION RATE: Sed Rate: 8 mm/hr (ref 0–16)

## 2016-01-29 MED ORDER — INSULIN ASPART 100 UNIT/ML ~~LOC~~ SOLN
0.0000 [IU] | Freq: Three times a day (TID) | SUBCUTANEOUS | Status: DC
  Administered 2016-01-29 (×2): 8 [IU] via SUBCUTANEOUS
  Administered 2016-01-29: 11 [IU] via SUBCUTANEOUS
  Administered 2016-01-30 (×2): 5 [IU] via SUBCUTANEOUS

## 2016-01-29 MED ORDER — PIPERACILLIN-TAZOBACTAM 3.375 G IVPB 30 MIN
3.3750 g | Freq: Once | INTRAVENOUS | Status: AC
  Administered 2016-01-29: 3.375 g via INTRAVENOUS
  Filled 2016-01-29: qty 50

## 2016-01-29 MED ORDER — ROSUVASTATIN CALCIUM 10 MG PO TABS
20.0000 mg | ORAL_TABLET | Freq: Every day | ORAL | Status: DC
  Administered 2016-01-29 – 2016-02-01 (×4): 20 mg via ORAL
  Filled 2016-01-29 (×4): qty 2

## 2016-01-29 MED ORDER — ACETAMINOPHEN 325 MG PO TABS: 650.0000 mg | ORAL_TABLET | Freq: Four times a day (QID) | ORAL | Status: DC | PRN

## 2016-01-29 MED ORDER — INSULIN GLARGINE 100 UNIT/ML ~~LOC~~ SOLN
50.0000 [IU] | Freq: Every day | SUBCUTANEOUS | Status: DC
  Administered 2016-01-30: 50 [IU] via SUBCUTANEOUS
  Filled 2016-01-29: qty 0.5

## 2016-01-29 MED ORDER — ASPIRIN EC 81 MG PO TBEC
81.0000 mg | DELAYED_RELEASE_TABLET | Freq: Every day | ORAL | Status: DC
  Administered 2016-01-29 – 2016-02-01 (×4): 81 mg via ORAL
  Filled 2016-01-29 (×4): qty 1

## 2016-01-29 MED ORDER — PIPERACILLIN-TAZOBACTAM 3.375 G IVPB
3.3750 g | Freq: Three times a day (TID) | INTRAVENOUS | Status: DC
  Administered 2016-01-29 – 2016-02-01 (×10): 3.375 g via INTRAVENOUS
  Filled 2016-01-29 (×12): qty 50

## 2016-01-29 MED ORDER — INSULIN GLARGINE 100 UNIT/ML ~~LOC~~ SOLN
35.0000 [IU] | Freq: Every day | SUBCUTANEOUS | Status: DC
  Administered 2016-01-29: 35 [IU] via SUBCUTANEOUS
  Filled 2016-01-29 (×2): qty 0.35

## 2016-01-29 MED ORDER — SODIUM CHLORIDE 0.9 % IV SOLN
INTRAVENOUS | Status: DC
  Administered 2016-01-29 – 2016-01-30 (×4): via INTRAVENOUS

## 2016-01-29 MED ORDER — GADOBENATE DIMEGLUMINE 529 MG/ML IV SOLN
17.0000 mL | Freq: Once | INTRAVENOUS | Status: AC | PRN
  Administered 2016-01-29: 17 mL via INTRAVENOUS

## 2016-01-29 MED ORDER — VANCOMYCIN HCL IN DEXTROSE 1-5 GM/200ML-% IV SOLN
1000.0000 mg | Freq: Once | INTRAVENOUS | Status: AC
  Administered 2016-01-29: 1000 mg via INTRAVENOUS
  Filled 2016-01-29: qty 200

## 2016-01-29 MED ORDER — INSULIN ASPART 100 UNIT/ML ~~LOC~~ SOLN
0.0000 [IU] | Freq: Every day | SUBCUTANEOUS | Status: DC
  Administered 2016-01-29: 2 [IU] via SUBCUTANEOUS
  Administered 2016-01-30: 3 [IU] via SUBCUTANEOUS

## 2016-01-29 MED ORDER — NYSTATIN 100000 UNIT/ML MT SUSP
5.0000 mL | Freq: Four times a day (QID) | OROMUCOSAL | Status: DC
  Administered 2016-01-29 – 2016-02-01 (×14): 500000 [IU] via ORAL
  Filled 2016-01-29 (×16): qty 5

## 2016-01-29 MED ORDER — ACETAMINOPHEN 650 MG RE SUPP: 650.0000 mg | Freq: Four times a day (QID) | RECTAL | Status: DC | PRN

## 2016-01-29 MED ORDER — ENOXAPARIN SODIUM 40 MG/0.4ML ~~LOC~~ SOLN
40.0000 mg | SUBCUTANEOUS | Status: DC
  Administered 2016-01-29 – 2016-02-01 (×4): 40 mg via SUBCUTANEOUS
  Filled 2016-01-29 (×4): qty 0.4

## 2016-01-29 MED ORDER — LISINOPRIL 5 MG PO TABS
5.0000 mg | ORAL_TABLET | Freq: Every day | ORAL | Status: DC
  Administered 2016-01-29 – 2016-02-01 (×4): 5 mg via ORAL
  Filled 2016-01-29 (×4): qty 1

## 2016-01-29 MED ORDER — SODIUM CHLORIDE 0.9 % IV BOLUS (SEPSIS)
1000.0000 mL | Freq: Once | INTRAVENOUS | Status: AC
  Administered 2016-01-29: 1000 mL via INTRAVENOUS

## 2016-01-29 MED ORDER — VANCOMYCIN HCL IN DEXTROSE 750-5 MG/150ML-% IV SOLN
750.0000 mg | Freq: Two times a day (BID) | INTRAVENOUS | Status: DC
  Administered 2016-01-29 – 2016-02-01 (×6): 750 mg via INTRAVENOUS
  Filled 2016-01-29 (×9): qty 150

## 2016-01-29 MED ORDER — VANCOMYCIN HCL IN DEXTROSE 1-5 GM/200ML-% IV SOLN: 1000.0000 mg | Freq: Once | INTRAVENOUS | Status: DC

## 2016-01-29 NOTE — H&P (Signed)
[facility] Admission History and Physical Service Pager: [phone]  Patient name: Adam Ray Medical record number: [id number] Date of birth: February 23, 1956 Age: 60 y.o. Gender: male  Primary Care Provider: Chrisandra Netters, MD Consultants: None Code Status: Full  Chief Complaint: Right lower leg swelling and redness  Assessment and Plan: Adam Ray is a 60 y.o. male with a past medical history significant for osteomyelitis s/p first right toe ray amputation, DMII, Hyperlipidemia, Hypertension, COPD who presents today with right leg swelling and redness consistent with cellulitis most likely secondary the second right toe phalanx infection.    #Non purulent right leg cellulitis 2/2 second toe infection/osteomyelitis Patient presented with acute onset of right leg swelling with some redness, cracked skin accompanied with some weeping.On exam, right leg is larger than left, warm and tight. No pitting edema noted on exam. Swelling is mostly below his right knee with redness extending to mid calf. Right second toe wound, with some dry blood, but no drainage, purulence or foul odor noted on exam. Sensation intact bilaterally. Patient denies any pain fever or chills and WBC is within normal limit amd lactic acid is 1.69 (wnl). Patient is not tachycardic or tachypneic. Patient does not meet SIRS criteria. Bedside doppler was done and  pulses were detected. At baseline, patient R leg has been larger than left since he underwent a right toe ray amputation in 2006. Right foot X ray shows possible chronic osteomyelitis of the second distal phalanx with overlying ulceration. Patient with no history of prolonged immobility that would be suggestive of a DVT. Patient denies any shortness of breath or pain associated with leg swelling. Differential diagnosis would include cellulitis 2/2 second toe infection/osteo vs  Right leg DVT vs venous insufficiency/neuropathy 2/2 to uncontrolled  diabetes and prior great toe ray amputation. --Place on observation with FMTS, admitting physician Dr.Hensel --Follow up on blood cultures --Continue vancomycin 1g, consider deescalating for a more narrow coverage; add Zosyn for gram negative coverage especially given h/o diabetes  -arterial doppler of right LE given non-palpable distal pulses  --Order MRI right foot to r/o osteomyelitis of the second phalanx --am CBC, BMP --CRP and Sed red --Duplex US of LE for DVT rule out --NS 125cc/hr --Acetaminophen for pain control --Monitor vitals  --Consider restarted lasix for chronic edema -will likely need ortho consult and/or vascular consult pending further work up   #Thrush Patient with white plaques on his tongue consistent with what appears to be candida infection. Patient has a history of uncontrolled diabetes which could explained etiology. Patient also with a history of STD, last HIV test in 2016 was non reactive.  --Start patient on nystatin qid --Order HIV test  #Right plantar surface callus Appears to be chronic, does not look infected no surrounding erythema, warm or redness. --Consider podiatry consult, can likely be done outpatient   #DM2 Patient with history of poor DM type 2 control. Last A1c 8.9 (11/15/2015). Will repeat A1c, it has been almost three months. Patient could use Diabete education while inpatient. Patient is on 70 U of lantus. Will decrease by half while hospitalized. Blood glucose on admission 361, further evidence of poor control. --Start patient on 63 lantus --CBGs --SSI --Order A1C  #HTN Blood pressure on admission 129/80, well control. Will continue home regimen --Continue lisinopril 5mg  daily --Continue ASA 81 mg daily   #COPD Mild, no new oxygen requirement on admission. patient denies SOB, cough will continue home regimen --Continue albuterol 2 puffs q6 prn  #  HLD --Continue rosuvastatin 20mg  daily  FEN/GI: NS125 cc/hr, Diet Carb  modified Prophylaxis:Lovenox   Disposition: Place under observation for osteomyelitis/cellulitis rule out  History of Present Illness:  Adam Ray is a 60 y.o. male with a past medical history significant for osteomyelitis s/p first right toe ray amputation, DMII, Hyperlipidemia, Hypertension and COPD who presented with right leg swelling and redness. Patient reports that he started to notice that his right leg was getting larger on Friday. Patient states R leg is larger than L leg at baseline. Swelling was mostly localized to foot and calf. Patient also noticed second toes of his right foot appears to be "infected". Swelling worsened of the next two days and this morning patient used some "peroxide" on his foot prior to going to church. Patient has been ambulating and is able to put some weight on his foot and toe. Patient notice also that his skin was cracking with increase leg circumference. Patient was concerned because he had a similar presentation a few years ago which lead to great toe amputation on his right foot. Patient denies any pain on his right toe and swollen leg. Patient denies fever, chills, abdominal pain, chest pain, shortness of breath, nausea, vomiting and diarrhea.  In the ED, patient was started on vancomycin, an X ray of his right foot was taken which showed some evidence of chronic osteomyelitis in the phalanx of his second right toe. Patient had dopplerable pulses by ED nursing staff.   Review Of Systems: Per HPI otherwise the remainder of the systems were negative.  Patient Active Problem List   Diagnosis Date Noted  . Osteomyelitis ([place]) 01/29/2016  . Diabetes ([place]) 12/08/2015  . Left shoulder pain 08/10/2015  . Pain in joint, shoulder region 02/21/2015  . Pulmonary nodule 02/01/2015  . Screening for colon cancer 01/09/2015  . URI (upper respiratory infection) 04/29/2014  . Lumbar back pain 03/21/2014  . Rash and nonspecific skin eruption 12/22/2013  . Screening  for STD (sexually transmitted disease) 12/15/2013  . Depression 12/09/2013  . Lightheadedness 11/19/2013  . Genital warts 08/20/2013  . 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 Right leg pain 04/27/2013  . Diabetic retinopathy ([place]) 01/28/2013  . Visit for well man health check 11/19/2010  . Sleep apnea 09/06/2010  . 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 LEG EDEMA 03/03/2009  . Essential hypertension, benign 11/09/2008  . 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 SICKLE-CELL TRAIT 04/26/2006  . ERECTILE DYSFUNCTION 04/26/2006  . TOBACCO ABUSE 04/26/2006  . VENOUS INSUFFICIENCY 04/26/2006  . ALLERGIC RHINITIS 04/26/2006    Past Medical History: Past Medical History:  Diagnosis Date  . Allergy   . Arthritis   . Bronchitis   . Cataract   . Chronic kidney disease   . Claudication [facility])    right foot ray resection  . Colon polyps    hyperplastic  . COPD (chronic obstructive pulmonary disease) ([place])   . Diabetes mellitus   . Genital warts   . Gout     Past Surgical History: Past Surgical History:  Procedure Laterality Date  . I&D EXTREMITY  04/11/2012   Procedure: IRRIGATION AND DEBRIDEMENT EXTREMITY;  Surgeon: Wylene Simmer, MD;  Location: [place];  Service: Orthopedics;  Laterality: Right;  . Surgery left great toe    . Tear ducts bilateral eyes      Social History: Social History  Substance Use Topics  . Smoking status: Former Smoker    Packs/day: 0.30    Years: 48.00  Types: Cigars    Start date: 07/02/1963  . Smokeless tobacco: Former Systems developer     Comment: wants to use electric cigarettes.  not interested in pills, worried about chantix side effects  . Alcohol use 0.0 oz/week   Additional social history: Please also refer to relevant sections of EMR.  Family History: Family History  Problem Relation Age of Onset  . Diabetes Mother   . Stroke Mother   . Heart failure Father      Allergies and Medications: No Known Allergies No current  facility-administered medications on file prior to encounter.    Current Outpatient Prescriptions on File Prior to Encounter  Medication Sig Dispense Refill  . acetaminophen (TYLENOL) 500 MG tablet Take 2 tablets (1,000 mg total) by mouth every 8 (eight) hours as needed. 60 tablet 0  . aspirin EC 81 MG tablet Take 81 mg by mouth daily.    . insulin aspart (NOVOLOG) 100 UNIT/ML injection Inject 30 Units into the skin 3 (three) times daily before meals. And pen needles 4/day (Patient taking differently: Inject 25-40 Units into the skin 3 (three) times daily before meals. Sliding scale) 30 mL 11  . insulin glargine (LANTUS) 100 UNIT/ML injection Inject 0.7 mLs (70 Units total) into the skin at bedtime. (Patient taking differently: Inject 100 Units into the skin daily. ) 30 mL 11  . quinapril (ACCUPRIL) 5 MG tablet Take 0.5 tablets (2.5 mg total) by mouth daily. 30 tablet 11  . ACCU-CHEK FASTCLIX LANCETS MISC     . ACCU-CHEK SOFTCLIX LANCETS lancets Use as instructed 100 each 12  . albuterol (PROVENTIL HFA;VENTOLIN HFA) 108 (90 Base) MCG/ACT inhaler Inhale 2 puffs into the lungs every 6 (six) hours as needed for wheezing or shortness of breath. (Patient not taking: Reported on 01/29/2016) 1 Inhaler 0  . Alcohol Swabs (B-D SINGLE USE SWABS REGULAR) PADS     . BD INSULIN SYRINGE ULTRAFINE 31G X 5/16" 0.3 ML MISC     . Blood Glucose Calibration (ACCU-CHEK SMARTVIEW CONTROL) LIQD     . Blood Glucose Monitoring Suppl (ACCU-CHEK AVIVA) device Use as instructed to check blood sugar 3 times per day 1 each 0  . chlorhexidine (PERIDEX) 0.12 % solution Use as directed 10 mLs in the mouth or throat 4 (four) times daily. (Patient not taking: Reported on 01/29/2016) 120 mL 0  . furosemide (LASIX) 20 MG tablet TAKE 1 TABLET BY MOUTH EVERY DAY MAY TAKE UP TO 2 PER DAY (Patient not taking: Reported on 01/29/2016) 30 tablet 2  . glucose blood (ACCU-CHEK AVIVA) test strip Use as instructed 100 each 12  .  HYDROcodone-acetaminophen (NORCO) 5-325 MG tablet Take 1 tablet by mouth every 6 (six) hours as needed for moderate pain. (Patient not taking: Reported on 01/29/2016) 15 tablet 0  . meloxicam (MOBIC) 7.5 MG tablet Take 1 tablet (7.5 mg total) by mouth daily. (Patient not taking: Reported on 01/29/2016) 30 tablet 0  . methocarbamol (ROBAXIN) 750 MG tablet Take 1 tablet (750 mg total) by mouth every 6 (six) hours as needed for muscle spasms (and pain). (Patient not taking: Reported on 01/29/2016) 20 tablet 0  . naproxen (NAPROSYN) 500 MG tablet Take 1 tablet (500 mg total) by mouth 2 (two) times daily with a meal. (Patient not taking: Reported on 01/29/2016) 28 tablet 0  . sildenafil (VIAGRA) 100 MG tablet TAKE 1 TABLET 1 HOUR BEFORE SEXUAL RELATIONS (Patient not taking: Reported on 01/29/2016) 5 tablet 0  . traMADol (ULTRAM) 50 MG tablet  Take 1 tablet (50 mg total) by mouth every 8 (eight) hours as needed. (Patient not taking: Reported on 01/29/2016) 30 tablet 0  . zoster vaccine live, PF, (ZOSTAVAX) 09811 UNT/0.65ML injection Inject 19,400 Units into the skin once. (Patient not taking: Reported on 01/29/2016) 1 each 0  . [DISCONTINUED] levofloxacin (LEVAQUIN) 750 MG tablet Take 1 tablet (750 mg total) by mouth daily. For 14 days 14 tablet 0    Objective: BP 144/81   Pulse (!) 56   Temp 98 F (36.7 C) (Oral)   Resp 18   Ht 5\' 10"  (1.778 m)   Wt 171 lb (77.6 kg)   SpO2 98%   BMI 24.54 kg/m  Exam: Physical Exam  Constitutional: He is oriented to person, place, and time. He appears well-developed and well-nourished.  HENT:  Head: Normocephalic and atraumatic.  White plaques present on tongue  Eyes: EOM are normal. Pupils are equal, round, and reactive to light.  Neck: Normal range of motion. Neck supple. No JVD present.  Cardiovascular: Normal rate, regular rhythm and normal heart sounds.  Exam reveals no gallop and no friction rub.   No murmur heard. Pulses:      Popliteal pulses are 0 on the  right side.       Dorsalis pedis pulses are 0 on the right side.  Pulmonary/Chest: Effort normal and breath sounds normal. He has no wheezes. He has no rales.  Abdominal: Soft. Bowel sounds are normal. He exhibits no distension. There is no tenderness. There is no rebound and no guarding.  Musculoskeletal:       Right ankle: He exhibits normal range of motion, no swelling and no laceration.       Right lower leg: He exhibits swelling, edema and laceration.       Left lower leg: He exhibits no edema and no laceration.       Right foot: There is decreased range of motion, swelling, deformity and laceration.       Feet:  Hammertoes right foot,  Great toe ray amputation   Feet:  Right Foot:  Skin Integrity: Positive for skin breakdown, erythema, warmth, callus and dry skin.  Neurological: He is alert and oriented to person, place, and time.  Skin: Skin is warm and dry.  Psychiatric: He has a normal mood and affect. His behavior is normal. Judgment and thought content normal.         Labs and Imaging: CBC BMET   Recent Labs Lab 01/28/16 2036  WBC 3.8*  HGB 13.4  HCT 40.9  PLT 134*    Recent Labs Lab 01/28/16 2036  NA 135  K 4.7  CL 101  CO2 27  BUN 13  CREATININE 1.24  GLUCOSE 361*  CALCIUM 9.0     Lactic Acid 1.47-->1.64  Dg Foot Complete Right  Result Date: 01/29/2016 CLINICAL DATA:  RIGHT leg swelling and second toe infection for 1 week. History of diabetes, gout. EXAM: RIGHT FOOT COMPLETE - 3+ VIEW COMPARISON:  RIGHT foot radiograph March 01, 2004 FINDINGS: Erosion, sclerosis, flattening of the second distal phalanx tuft associated with apparent ulceration and packing material. No acute fracture deformity or dislocation. Status post first digit amputation. Pes planus on nonweightbearing radiographs. Diffuse soft tissue swelling. No radiopaque foreign bodies. IMPRESSION: Suspected chronic osteomyelitis second distal phalanx with overlying ulceration. Findings  could be confirmed with MRI as clinically indicated. Diffuse soft tissue swelling. Electronically Signed   By: Elon Alas M.D.   On: 01/29/2016 00:45  Marjie Skiff, MD 01/29/2016, 2:55 AM PGY-1, [place] Intern pager: [phone], text pages welcome  Upper Level Addendum:  I have seen and evaluated this patient along with Dr. Andy Gauss and reviewed the above note, making necessary revisions in green.   Phill Myron, D.O. 01/29/2016, 6:59 AM PGY-2, [place]

## 2016-01-29 NOTE — Consult Note (Signed)
ORTHOPAEDIC CONSULTATION  REQUESTING PHYSICIAN: [name], MD  Chief Complaint: Ulceration swelling osteomyelitis right foot second toe  HPI: Adam Ray is a 60 y.o. male who presents with osteomyelitis ulceration right foot second toe. Patient states he is status post a first ray amputation about a 10 years ago. Patient states he has developed increasing swelling ulceration to the second toe right foot. 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  Past Medical History:  Diagnosis Date  . Allergy   . Arthritis   . Bronchitis   . Cataract   . Chronic kidney disease   . Claudication [facility])    right foot ray resection  . Colon polyps    hyperplastic  . COPD (chronic obstructive pulmonary disease) ([place])   . Diabetes mellitus   . Genital warts   . Gout    Past Surgical History:  Procedure Laterality Date  . I&D EXTREMITY  04/11/2012   Procedure: IRRIGATION AND DEBRIDEMENT EXTREMITY;  Surgeon: Wylene Simmer, MD;  Location: [place];  Service: Orthopedics;  Laterality: Right;  . Surgery left great toe    . Tear ducts bilateral eyes     Social History   Social History  . Marital status: Married    Spouse name: N/A  . Number of children: 3  . Years of education: N/A   Occupational History  . disabled    Social History Main Topics  . Smoking status: Former Smoker    Packs/day: 0.30    Years: 48.00    Types: Cigars    Start date: 07/02/1963  . Smokeless tobacco: Former Systems developer     Comment: wants to use electric cigarettes.  not interested in pills, worried about chantix side effects  . Alcohol use 0.0 oz/week  . Drug use: No  . Sexual activity: Not Asked   Other Topics Concern  . None   Social History Narrative  . None   Family History  Problem Relation Age of Onset  . Diabetes Mother   . Stroke Mother   . Heart failure Father    - negative except otherwise stated in the family history section No Known Allergies Prior to Admission  medications   Medication Sig Start Date End Date Taking? Authorizing Provider  acetaminophen (TYLENOL) 500 MG tablet Take 2 tablets (1,000 mg total) by mouth every 8 (eight) hours as needed. 10/02/15  Yes [name], MD  aspirin EC 81 MG tablet Take 81 mg by mouth daily.   Yes Historical Provider, MD  insulin aspart (NOVOLOG) 100 UNIT/ML injection Inject 30 Units into the skin 3 (three) times daily before meals. And pen needles 4/day Patient taking differently: Inject 25-40 Units into the skin 3 (three) times daily before meals. Sliding scale 12/08/15  Yes Renato Shin, MD  insulin glargine (LANTUS) 100 UNIT/ML injection Inject 0.7 mLs (70 Units total) into the skin at bedtime. Patient taking differently: Inject 100 Units into the skin daily.  12/08/15  Yes Renato Shin, MD  quinapril (ACCUPRIL) 5 MG tablet Take 0.5 tablets (2.5 mg total) by mouth daily. 07/27/15  Yes [name], MD  rosuvastatin (CRESTOR) 20 MG tablet Take 20 mg by mouth daily. 01/21/16  Yes Historical Provider, MD  ACCU-CHEK FASTCLIX LANCETS [place]  12/30/14   Historical Provider, MD  ACCU-CHEK SOFTCLIX LANCETS lancets Use as instructed 11/16/15   [name], MD  albuterol (PROVENTIL HFA;VENTOLIN HFA) 108 (90 Base) MCG/ACT inhaler Inhale 2 puffs into the lungs every  6 (six) hours as needed for wheezing or shortness of breath. Patient not taking: Reported on 01/29/2016 07/27/15   [name], MD  Alcohol Swabs (B-D SINGLE USE SWABS REGULAR) PADS  12/30/14   Historical Provider, MD  BD INSULIN SYRINGE ULTRAFINE 31G X 5/16" 0.3 ML MISC  12/30/14   Historical Provider, MD  Blood Glucose Calibration (ACCU-CHEK SMARTVIEW CONTROL) LIQD  12/30/14   Historical Provider, MD  Blood Glucose Monitoring Suppl (ACCU-CHEK AVIVA) device Use as instructed to check blood sugar 3 times per day 11/15/15 11/14/16  [name], MD  chlorhexidine (PERIDEX) 0.12 % solution Use as directed 10 mLs in the mouth or throat 4 (four) times  daily. Patient not taking: Reported on 01/29/2016 07/27/15   [name], MD  furosemide (LASIX) 20 MG tablet TAKE 1 TABLET BY MOUTH EVERY DAY MAY TAKE UP TO 2 PER DAY Patient not taking: Reported on 01/29/2016 07/27/15   [name], MD  glucose blood (ACCU-CHEK AVIVA) test strip Use as instructed 11/16/15   [name], MD  HYDROcodone-acetaminophen (NORCO) 5-325 MG tablet Take 1 tablet by mouth every 6 (six) hours as needed for moderate pain. Patient not taking: Reported on 01/29/2016 08/11/15   Melony Overly, MD  meloxicam (MOBIC) 7.5 MG tablet Take 1 tablet (7.5 mg total) by mouth daily. Patient not taking: Reported on 01/29/2016 08/11/15   Melony Overly, MD  methocarbamol (ROBAXIN) 750 MG tablet Take 1 tablet (750 mg total) by mouth every 6 (six) hours as needed for muscle spasms (and pain). Patient not taking: Reported on 01/29/2016 11/08/14   Clayton Bibles, PA-C  naproxen (NAPROSYN) 500 MG tablet Take 1 tablet (500 mg total) by mouth 2 (two) times daily with a meal. Patient not taking: Reported on 01/29/2016 08/10/15   Rosemarie Ax, MD  sildenafil (VIAGRA) 100 MG tablet TAKE 1 TABLET 1 HOUR BEFORE SEXUAL RELATIONS Patient not taking: Reported on 01/29/2016 05/08/15   [name], MD  traMADol (ULTRAM) 50 MG tablet Take 1 tablet (50 mg total) by mouth every 8 (eight) hours as needed. Patient not taking: Reported on 01/29/2016 11/15/15   [name], MD  zoster vaccine live, PF, (ZOSTAVAX) 60454 UNT/0.65ML injection Inject 19,400 Units into the skin once. Patient not taking: Reported on 01/29/2016 09/15/15   [name], MD   Adam Ray Wo Contrast  Result Date: 01/29/2016 CLINICAL DATA:  Open wound on the second toe. Diabetic with history of prior first ray amputation. EXAM: MRI OF THE RIGHT FOREFOOT WITHOUT AND WITH CONTRAST TECHNIQUE: Multiplanar, multisequence Adam imaging was performed both before and after administration of intravenous contrast. CONTRAST:   60mL MULTIHANCE GADOBENATE DIMEGLUMINE 529 MG/ML IV SOLN COMPARISON:  Radiographs 01/29/2016 FINDINGS: Extensive subcutaneous soft tissue swelling/ edema/fluid involving the dorsum of the forefoot and midfoot. No discrete drainable soft tissue abscess is identified. Diffuse myositis without findings for pyomyositis. T2 signal abnormality and enhancement involving the second middle and distal phalanges consistent with osteomyelitis. Mild degenerative changes at the second metatarsal phalangeal joint with small joint effusion but no definite findings for septic arthritis. Moderate midfoot degenerative changes are noted. IMPRESSION: 1. Adam findings consistent with osteomyelitis involving the middle and distal phalanges of the second toe. 2. Severe cellulitis without discrete abscess. 3. Myofasciitis without findings for pyomyositis. 4. No definite Adam findings for septic arthritis. Electronically Signed   By: Marijo Sanes M.D.   On: 01/29/2016 14:25   Dg Foot Complete Right  Result Date: 01/29/2016 CLINICAL DATA:  RIGHT leg swelling and second toe infection for 1 week. History of diabetes, gout. EXAM: RIGHT FOOT COMPLETE - 3+ VIEW COMPARISON:  RIGHT foot radiograph March 01, 2004 FINDINGS: Erosion, sclerosis, flattening of the second distal phalanx tuft associated with apparent ulceration and packing material. No acute fracture deformity or dislocation. Status post first digit amputation. Pes planus on nonweightbearing radiographs. Diffuse soft tissue swelling. No radiopaque foreign bodies. IMPRESSION: Suspected chronic osteomyelitis second distal phalanx with overlying ulceration. Findings could be confirmed with MRI as clinically indicated. Diffuse soft tissue swelling. Electronically Signed   By: Elon Alas M.D.   On: 01/29/2016 00:45  - pertinent xrays, CT, MRI studies were reviewed and independently interpreted  Positive ROS: All other systems have been reviewed and were otherwise negative with  the exception of those mentioned in the HPI and as above.  Physical Exam: General: Alert, no acute distress Cardiovascular: Massive venous stasis edema right lower extremity. Respiratory: No cyanosis, no use of accessory musculature GI: No organomegaly, abdomen is soft and non-tender Skin: Patient has sausage digit swelling of the second toe with ulceration which probes to bone at the tuft of the second toe. Neurologic: Patient does not have protective sensation. Psychiatric: Patient is competent for consent with normal mood and affect Lymphatic: No axillary or cervical lymphadenopathy  MUSCULOSKELETAL:  On examination patient has massive swelling in the foot and ankle leg he has venous stasis changes with brawny skin color changes. He does have a palpable dorsalis pedis pulse. He has fixed clawing of the second toe status post first ray amputation. Review of the radiographs and MRI scan shows osteomyelitis involving the second toe with no ascending abscess.  Assessment: Assessment: Diabetic insensate neuropathy with venous stasis insufficiency with history [place] continued smoking with osteomyelitis ulceration and swelling right foot second toe status post first ray amputation 10 years ago.  Plan: Plan: We'll plan for a second toe amputation at the MTP joint most likely Wednesday morning. Continue IV antibiotics until surgery. Discussed with the patient the critical nature of controlling his glucose and stopping smoking. Discussed that with continued for glucose control and continued smoking patient is at increased risk for a transtibial amputation. Patient states that he is concerned that if he did stop smoking and control his diabetes he may still have complications.  Thank you for the consult and the opportunity to see Adam. [place], MD [facility] [phone] 5:39 PM

## 2016-01-29 NOTE — Progress Notes (Addendum)
Pharmacy Antibiotic Note  Adam Ray is a 59 y.o. male admitted on 01/28/2016 with cellulitis.  Pharmacy has been consulted for Vancomycin dosing.  Vanc 1gm IV given in ED ~0140  Plan: Vancomycin 750mg  IV q12h Will f/u micro data, renal function, and pt's clinical condition Vanc trough prn   Height: 5\' 10"  (177.8 cm) Weight: 171 lb (77.6 kg) IBW/kg (Calculated) : 73  Temp (24hrs), Avg:98 F (36.7 C), Min:97.8 F (36.6 C), Max:98.1 F (36.7 C)   Recent Labs Lab 01/28/16 2036 01/28/16 2047 01/29/16 0156  WBC 3.8*  --   --   CREATININE 1.24  --   --   LATICACIDVEN  --  1.47 1.64    Estimated Creatinine Clearance: 65.4 mL/min (by C-G formula based on SCr of 1.24 mg/dL).    No Known Allergies  Antimicrobials this admission: 7/31 Vanc >>   Dose adjustments this admission: n/a  Microbiology results: 7/31 BCx x2:   Thank you for allowing pharmacy to be a part of this patient's care.  Sherlon Handing, PharmD, BCPS Clinical pharmacist, pager [phone] 01/29/2016 3:20 AM    ADDENDUM: To add Zosyn for broader coverage.  Will start Zosyn 3.365g IV Q8H. Wynona Neat, PharmD, BCPS 01/29/2016 7:20 AM

## 2016-01-29 NOTE — Progress Notes (Signed)
Inpatient Diabetes Program Recommendations  AACE/ADA: New Consensus Statement on Inpatient Glycemic Control (2015)  Target Ranges:  Prepandial:   less than 140 mg/dL      Peak postprandial:   less than 180 mg/dL (1-2 hours)      Critically ill patients:  140 - 180 mg/dL     Review of Glycemic Control  Diabetes history: DM2 Outpatient Diabetes medications: Lantus 100 units qd + Novolog 25-40 units meal coverage tid Current orders for Inpatient glycemic control: Lantus 35 units +0-15 units tid+0-5 units hs  Inpatient Diabetes Program Recommendations:  Please consider increase to 50% of home Lantus dose to 50 units qd and add meal coverage when eats > 50% 8 units tid.  Thank you, Nani Gasser. Kordelia Severin, RN, MSN, CDE Inpatient Glycemic Control Team Team Pager [phone] (8am-5pm) 01/29/2016 9:51 AM

## 2016-01-30 ENCOUNTER — Inpatient Hospital Stay (HOSPITAL_COMMUNITY): Payer: Commercial Managed Care - HMO

## 2016-01-30 DIAGNOSIS — I10 Essential (primary) hypertension: Secondary | ICD-10-CM

## 2016-01-30 DIAGNOSIS — R0989 Other specified symptoms and signs involving the circulatory and respiratory systems: Secondary | ICD-10-CM

## 2016-01-30 DIAGNOSIS — R229 Localized swelling, mass and lump, unspecified: Secondary | ICD-10-CM

## 2016-01-30 DIAGNOSIS — M7989 Other specified soft tissue disorders: Secondary | ICD-10-CM

## 2016-01-30 DIAGNOSIS — E1159 Type 2 diabetes mellitus with other circulatory complications: Secondary | ICD-10-CM

## 2016-01-30 LAB — BASIC METABOLIC PANEL
Anion gap: 6 (ref 5–15)
BUN: 13 mg/dL (ref 6–20)
CHLORIDE: 102 mmol/L (ref 101–111)
CO2: 28 mmol/L (ref 22–32)
Calcium: 8.4 mg/dL — ABNORMAL LOW (ref 8.9–10.3)
Creatinine, Ser: 1.11 mg/dL (ref 0.61–1.24)
GFR calc non Af Amer: >60 mL/min (ref >60)
Glucose, Bld: 201 mg/dL — ABNORMAL HIGH (ref 65–99)
POTASSIUM: 4 mmol/L (ref 3.5–5.1)
SODIUM: 136 mmol/L (ref 135–145)

## 2016-01-30 LAB — GLUCOSE, CAPILLARY
GLUCOSE-CAPILLARY: 224 mg/dL — AB (ref 65–99)
Glucose-Capillary: 105 mg/dL — ABNORMAL HIGH (ref 65–99)
Glucose-Capillary: 286 mg/dL — ABNORMAL HIGH (ref 65–99)

## 2016-01-30 LAB — HEMOGLOBIN A1C
HEMOGLOBIN A1C: 10.5 % — AB (ref 4.8–5.6)
MEAN PLASMA GLUCOSE: 255 mg/dL

## 2016-01-30 LAB — CBC
HCT: 40.4 % (ref 39.0–52.0)
HEMOGLOBIN: 13.2 g/dL (ref 13.0–17.0)
MCH: 29.3 pg (ref 26.0–34.0)
MCHC: 32.7 g/dL (ref 30.0–36.0)
MCV: 89.8 fL (ref 78.0–100.0)
Platelets: 119 10*3/uL — ABNORMAL LOW (ref 150–400)
RBC: 4.5 MIL/uL (ref 4.22–5.81)
RDW: 13.1 % (ref 11.5–15.5)
WBC: 3.7 10*3/uL — ABNORMAL LOW (ref 4.0–10.5)

## 2016-01-30 MED ORDER — INSULIN GLARGINE 100 UNIT/ML ~~LOC~~ SOLN
25.0000 [IU] | Freq: Every day | SUBCUTANEOUS | Status: DC
  Filled 2016-01-30: qty 0.25

## 2016-01-30 MED ORDER — CHLORHEXIDINE GLUCONATE 4 % EX LIQD: 60.0000 mL | Freq: Once | CUTANEOUS | Status: DC

## 2016-01-30 MED ORDER — POVIDONE-IODINE 10 % EX SWAB: 2.0000 "application " | Freq: Once | CUTANEOUS | Status: DC

## 2016-01-30 MED ORDER — SODIUM CHLORIDE 0.45 % IV SOLN
INTRAVENOUS | Status: DC
  Administered 2016-01-30: via INTRAVENOUS

## 2016-01-30 MED ORDER — NICOTINE 21 MG/24HR TD PT24
21.0000 mg | MEDICATED_PATCH | Freq: Every day | TRANSDERMAL | Status: DC
  Administered 2016-01-30 – 2016-02-01 (×2): 21 mg via TRANSDERMAL
  Filled 2016-01-30 (×3): qty 1

## 2016-01-30 NOTE — Progress Notes (Signed)
**  Preliminary report by tech**  Right lower extremity venous duplex completed. No evidence of deep or superficial vein thrombosis noted in the right leg or left common femoral vein. All visualized vessels on the right side appear patent and compressible. There is no evidence of a right sided Baker's cyst.  Incidental finding consistent with an enlarged lymph node on the right measuring 1.5 x 0.7 cm.  01/30/16 12:02 PM Adam Ray RVT

## 2016-01-30 NOTE — Clinical Social Work Note (Signed)
CSW called financial counselor, Philipp Ovens, and left voicemail regarding patient questions about social security.  Dayton Scrape, [place]

## 2016-01-30 NOTE — Care Management Important Message (Signed)
Important Message  Patient Details  Name: Adam Ray MRN: [id number] Date of Birth: [birth year]   Medicare Important Message Given:  Yes    [name] 01/30/2016, 8:16 AM

## 2016-01-30 NOTE — Progress Notes (Signed)
**  Preliminary report by tech**  VASCULAR LAB PRELIMINARY  ARTERIAL  ABI completed: Bilateral ABI's are suggestive of being within normal limits at rest.    RIGHT    LEFT    PRESSURE WAVEFORM  PRESSURE WAVEFORM  BRACHIAL 123 Triphasic BRACHIAL 111 Triphasic  DP 127 Biphasic DP 155 Triphasic  PT 152 Biphasic PT 147 Triphasic  GREAT TOE AMP NA GREAT TOE 0.76 NA    RIGHT LEFT  ABI 1.2 1.2     Legrand Como, RVT 01/30/2016, 12:18 PM

## 2016-01-30 NOTE — Care Management Obs Status (Addendum)
[place] NOTIFICATION   Patient Details  Name: Adam Ray MRN: [id number] Date of Birth: [birth year]   Medicare Observation Status Notification Given:  Yes  * patient was changed to inpatient  Ninfa Meeker, RN 01/30/2016, 11:25 AM

## 2016-01-30 NOTE — Progress Notes (Signed)
Family Medicine Teaching Service Daily Progress Note Intern Pager: [phone]  Patient name: Adam Ray Medical record number: [id number] Date of birth: [birth year] Age: 60 y.o. Gender: male  Primary Care Provider: Chrisandra Netters, MD Consultants: Ortho Code Status: FULL  Pt Overview and Major Events to Date:  Admitted - 7/30 R toe amp - 2006 Scheduled for Ortho surg - 8/2  Assessment and Plan:  #Non purulent right leg cellulitis 2/2 second toe infection/osteomyelitis On exam, right leg is larger than left, warm and tight. No pitting edema noted on exam. Swelling is mostly below his right knee with redness extending to mid calf. Right second toe wound, with some dry blood, but no drainage, purulence or foul odor noted on exam. Sensation intact bilaterally. Patient denies any pain fever or chills and WBC is within normal limit amd lactic acid is 1.69 (wnl). Patient is not tachycardic or tachypneic. Patient does not meet SIRS criteria. Bedside doppler was done and  pulses were detected. Right foot X ray shows possible chronic osteomyelitis of the second distal phalanx with overlying ulceration. Patient with no history of prolonged immobility that would be suggestive of a DVT. Patient denies any shortness of breath or pain associated with leg swelling. Differential diagnosis would include cellulitis 2/2 second toe infection/osteo vs  Right leg DVT vs venous insufficiency/neuropathy 2/2 to uncontrolled diabetes and prior great toe ray amputation.   MRI 7/31 extensive subcutaneous soft tissue swelling/ edema/fluid involving dorsum of the forefoot and midfoot. No discrete drainable soft tissue abscess identified. --Follow up on blood cultures - NG x24hours --Continue vancomycin (Day 2) 750mg , consider deescalating for a more narrow coverage -Continue IV zosyn Q8 (Day 2) for Gram neg coverage, esp given hx of diabetes --Duplex US of LE for DVT rule out, results pending (Prelim report: B/L ABI's  1.2, suggestive of being within normal limits at rest, no evidence of deep or superficial vein thrombosis notes in right leg) --am CBC, BMP 7/31 wnl, low wbc 3.1, low plat 119 --CRP: 2.6 (elev), Sed rate 8 --NS 125cc/hr --Acetaminophen for pain control --Monitor vitals  --Consider restarting lasix for chronic edema -Ortho consult: recc amputation of 2nd right toe at MTP joint. Likely surgery for tomorrow am. NPO at midnight.   #Thrush Patient with white plaques on his tongue consistent with what appears to be candida infection. Patient has a history of uncontrolled diabetes which could explained etiology. Patient also with a history of STD, last HIV test in 2016 was non reactive.  --Start patient on nystatin qid --HIV test Negative -Peripheral smear to r/o malignancy: pending   #Right plantar surface callus Appears to be chronic, does not look infected no surrounding erythema, warm or redness. --Consider podiatry consult, can likely be done outpatient.  #DM2 Patient with history of poor DM type 2 control. Last A1c 8.9 (11/15/2015). Will repeat A1c, it has been almost three months. Patient could use Diabete education while inpatient. Patient is on 70 U of lantus. Will decrease by half while hospitalized. Blood glucose on admission 361, further evidence of poor control. --currently on 50 units Lantus (increased today from 35 units) --Follow CBGs --SSI --Order A1C 10.5   #Asyptomatic bradycardia -EKG ordered 8/1: showed sinus bradycardia with left axis deviation -appears unchanged from previous EKG  #HTN Blood pressure on admission 129/80, well controlled. Will continue home regimen --Continue lisinopril 5mg  daily --Continue ASA 81 mg daily   #COPD Mild, no new oxygen requirement on admission. patient denies SOB, cough will continue home regimen --  Continue albuterol 2 puffs q6 prn  #HLD --Continue rosuvastatin 20mg  daily  FEN/GI: NS125 cc/hr, Diet Carb  modified Prophylaxis:Lovenox   Disposition: Place under observation for osteomyelitis/cellulitis rule out   FEN/GI: NS125 cc/hr, Diet Carb modified, If surgery tomorrow morning, NPO tonight at midnight Prophylaxis:Lovenox   Subjective:  Patient states he feels well and has no current complaints. VSS overnight except for bradycardia. Remained afebrile. Wants to shower and was informed he can as long as LE remains covered to avoid contact with water.  Objective: Temp:  [97.5 F (36.4 C)-98.2 F (36.8 C)] 97.9 F (36.6 C) (08/01 1300) Pulse Rate:  [49-57] 57 (08/01 1300) Resp:  [18-20] 18 (08/01 1300) BP: (102-129)/(65-74) 125/72 (08/01 1300) SpO2:  [98 %-100 %] 99 % (08/01 1300)   Physical Exam: General: awake, alert, oriented in NAD Cardiovascular: Normal S1S2 no murmurs appreciated on exam Respiratory: CTA B/L no increased work of breathing, comfortable on room air Abdomen: soft, NTND +BS Extremities: Right lower extremity nonpitting edema, right larger than left. Right second toe wound no drainage or blood, poor palpable pulse on exam  Laboratory:  Recent Labs Lab 01/28/16 2036 01/29/16 0508 01/30/16 0822  WBC 3.8* 3.1* 3.7*  HGB 13.4 12.9* 13.2  HCT 40.9 39.7 40.4  PLT 134* 119* 119*    Recent Labs Lab 01/28/16 2036 01/29/16 0508 01/30/16 0822  NA 135 135 136  K 4.7 4.2 4.0  CL 101 101 102  CO2 27 30 28   BUN 13 12 13   CREATININE 1.24 1.02 1.11  CALCIUM 9.0 8.4* 8.4*  PROT 5.8*  --   --   BILITOT 0.8  --   --   ALKPHOS 129*  --   --   ALT 19  --   --   AST 28  --   --   GLUCOSE 361* 287* 201*     Imaging/Diagnostic Tests: Duplex US LE  Lovenia Kim, MD 01/30/2016, 2:28 PM PGY-1, [place] Intern pager: [phone], text pages welcome

## 2016-01-31 ENCOUNTER — Encounter (HOSPITAL_COMMUNITY)
Admission: EM | Disposition: A | Discharge status: Home Health | Payer: Self-pay | Source: Home / Self Care | Attending: Family Medicine

## 2016-01-31 ENCOUNTER — Inpatient Hospital Stay (HOSPITAL_COMMUNITY): Payer: Commercial Managed Care - HMO | Admitting: Anesthesiology

## 2016-01-31 ENCOUNTER — Encounter (HOSPITAL_COMMUNITY): Payer: Self-pay | Admitting: Certified Registered Nurse Anesthetist

## 2016-01-31 DIAGNOSIS — Z794 Long term (current) use of insulin: Secondary | ICD-10-CM

## 2016-01-31 DIAGNOSIS — M25571 Pain in right ankle and joints of right foot: Secondary | ICD-10-CM | POA: Diagnosis not present

## 2016-01-31 DIAGNOSIS — E1151 Type 2 diabetes mellitus with diabetic peripheral angiopathy without gangrene: Secondary | ICD-10-CM

## 2016-01-31 DIAGNOSIS — Z720 Tobacco use: Secondary | ICD-10-CM

## 2016-01-31 DIAGNOSIS — M86272 Subacute osteomyelitis, left ankle and foot: Secondary | ICD-10-CM | POA: Diagnosis not present

## 2016-01-31 DIAGNOSIS — M86271 Subacute osteomyelitis, right ankle and foot: Secondary | ICD-10-CM | POA: Diagnosis not present

## 2016-01-31 HISTORY — PX: AMPUTATION: SHX166

## 2016-01-31 LAB — BASIC METABOLIC PANEL
Anion gap: 6 (ref 5–15)
BUN: 13 mg/dL (ref 6–20)
CHLORIDE: 106 mmol/L (ref 101–111)
CO2: 27 mmol/L (ref 22–32)
CREATININE: 1.09 mg/dL (ref 0.61–1.24)
Calcium: 8.6 mg/dL — ABNORMAL LOW (ref 8.9–10.3)
GFR calc Af Amer: >60 mL/min (ref >60)
GLUCOSE: 96 mg/dL (ref 65–99)
POTASSIUM: 3.7 mmol/L (ref 3.5–5.1)
Sodium: 139 mmol/L (ref 135–145)

## 2016-01-31 LAB — GLUCOSE, CAPILLARY
GLUCOSE-CAPILLARY: 104 mg/dL — AB (ref 65–99)
GLUCOSE-CAPILLARY: 113 mg/dL — AB (ref 65–99)
Glucose-Capillary: 218 mg/dL — ABNORMAL HIGH (ref 65–99)
Glucose-Capillary: 72 mg/dL (ref 65–99)
Glucose-Capillary: 101 mg/dL — ABNORMAL HIGH (ref 65–99)
Glucose-Capillary: 194 mg/dL — ABNORMAL HIGH (ref 65–99)
Glucose-Capillary: 254 mg/dL — ABNORMAL HIGH (ref 65–99)

## 2016-01-31 LAB — CBC
HEMATOCRIT: 37.7 % — AB (ref 39.0–52.0)
Hemoglobin: 12.4 g/dL — ABNORMAL LOW (ref 13.0–17.0)
MCH: 29 pg (ref 26.0–34.0)
MCHC: 32.9 g/dL (ref 30.0–36.0)
MCV: 88.3 fL (ref 78.0–100.0)
PLATELETS: 120 10*3/uL — AB (ref 150–400)
RBC: 4.27 MIL/uL (ref 4.22–5.81)
RDW: 13 % (ref 11.5–15.5)
WBC: 3.4 10*3/uL — ABNORMAL LOW (ref 4.0–10.5)

## 2016-01-31 LAB — PATHOLOGIST SMEAR REVIEW

## 2016-01-31 LAB — SURGICAL PCR SCREEN
MRSA, PCR: NEGATIVE
STAPHYLOCOCCUS AUREUS: NEGATIVE

## 2016-01-31 SURGERY — AMPUTATION DIGIT
Anesthesia: General | Laterality: Right

## 2016-01-31 MED ORDER — EPHEDRINE SULFATE 50 MG/ML IJ SOLN
INTRAMUSCULAR | Status: DC | PRN
  Administered 2016-01-31: 15 mg via INTRAVENOUS
  Administered 2016-01-31: 10 mg via INTRAVENOUS

## 2016-01-31 MED ORDER — ACETAMINOPHEN 325 MG PO TABS
650.0000 mg | ORAL_TABLET | Freq: Four times a day (QID) | ORAL | Status: DC | PRN
Start: 2016-01-31 — End: 2016-02-02
  Administered 2016-02-01: 650 mg via ORAL
  Filled 2016-01-31: qty 2

## 2016-01-31 MED ORDER — ONDANSETRON HCL 4 MG/2ML IJ SOLN
INTRAMUSCULAR | Status: DC | PRN
  Administered 2016-01-31: 4 mg via INTRAVENOUS

## 2016-01-31 MED ORDER — METHOCARBAMOL 500 MG PO TABS
500.0000 mg | ORAL_TABLET | Freq: Four times a day (QID) | ORAL | Status: DC | PRN
Start: 2016-01-31 — End: 2016-02-02
  Administered 2016-02-01 (×2): 500 mg via ORAL
  Filled 2016-01-31 (×2): qty 1

## 2016-01-31 MED ORDER — ONDANSETRON HCL 4 MG PO TABS: 4.0000 mg | ORAL_TABLET | Freq: Four times a day (QID) | ORAL | Status: DC | PRN

## 2016-01-31 MED ORDER — FENTANYL CITRATE (PF) 250 MCG/5ML IJ SOLN
INTRAMUSCULAR | Status: AC
  Filled 2016-01-31: qty 5

## 2016-01-31 MED ORDER — INSULIN ASPART 100 UNIT/ML ~~LOC~~ SOLN
0.0000 [IU] | SUBCUTANEOUS | Status: DC
  Administered 2016-01-31: 5 [IU] via SUBCUTANEOUS
  Administered 2016-01-31: 2 [IU] via SUBCUTANEOUS
  Administered 2016-02-01: 3 [IU] via SUBCUTANEOUS

## 2016-01-31 MED ORDER — PROPOFOL 10 MG/ML IV BOLUS
INTRAVENOUS | Status: DC | PRN
  Administered 2016-01-31: 150 mg via INTRAVENOUS

## 2016-01-31 MED ORDER — METOCLOPRAMIDE HCL 5 MG PO TABS: 5.0000 mg | ORAL_TABLET | Freq: Three times a day (TID) | ORAL | Status: DC | PRN

## 2016-01-31 MED ORDER — OXYCODONE HCL 5 MG/5ML PO SOLN: 5.0000 mg | Freq: Once | ORAL | Status: DC | PRN

## 2016-01-31 MED ORDER — INSULIN GLARGINE 100 UNIT/ML ~~LOC~~ SOLN
35.0000 [IU] | Freq: Every day | SUBCUTANEOUS | Status: DC
  Administered 2016-01-31: 35 [IU] via SUBCUTANEOUS
  Filled 2016-01-31 (×2): qty 0.35

## 2016-01-31 MED ORDER — ACETAMINOPHEN 650 MG RE SUPP: 650.0000 mg | Freq: Four times a day (QID) | RECTAL | Status: DC | PRN

## 2016-01-31 MED ORDER — INSULIN ASPART 100 UNIT/ML ~~LOC~~ SOLN: 0.0000 [IU] | SUBCUTANEOUS | Status: DC

## 2016-01-31 MED ORDER — LACTATED RINGERS IV SOLN
INTRAVENOUS | Status: DC
  Administered 2016-01-31: 09:00:00 via INTRAVENOUS

## 2016-01-31 MED ORDER — LIDOCAINE HCL (CARDIAC) 20 MG/ML IV SOLN
INTRAVENOUS | Status: DC | PRN
  Administered 2016-01-31: 60 mg via INTRATRACHEAL

## 2016-01-31 MED ORDER — METHOCARBAMOL 1000 MG/10ML IJ SOLN: 500.0000 mg | Freq: Four times a day (QID) | INTRAVENOUS | Status: DC | PRN

## 2016-01-31 MED ORDER — GLYCOPYRROLATE 0.2 MG/ML IV SOSY
PREFILLED_SYRINGE | INTRAVENOUS | Status: AC
  Filled 2016-01-31: qty 3

## 2016-01-31 MED ORDER — HYDROMORPHONE HCL 1 MG/ML IJ SOLN: 0.2500 mg | INTRAMUSCULAR | Status: DC | PRN

## 2016-01-31 MED ORDER — OXYCODONE HCL 5 MG PO TABS: 5.0000 mg | ORAL_TABLET | Freq: Once | ORAL | Status: DC | PRN

## 2016-01-31 MED ORDER — GLYCOPYRROLATE 0.2 MG/ML IJ SOLN
INTRAMUSCULAR | Status: DC | PRN
  Administered 2016-01-31: 0.1 mg via INTRAVENOUS

## 2016-01-31 MED ORDER — ONDANSETRON HCL 4 MG/2ML IJ SOLN
INTRAMUSCULAR | Status: AC
  Filled 2016-01-31: qty 2

## 2016-01-31 MED ORDER — OXYCODONE HCL 5 MG PO TABS
5.0000 mg | ORAL_TABLET | ORAL | Status: DC | PRN
  Administered 2016-02-01: 10 mg via ORAL
  Administered 2016-02-01: 5 mg via ORAL
  Filled 2016-01-31: qty 2
  Filled 2016-01-31: qty 1

## 2016-01-31 MED ORDER — PROPOFOL 10 MG/ML IV BOLUS
INTRAVENOUS | Status: AC
  Filled 2016-01-31: qty 20

## 2016-01-31 MED ORDER — 0.9 % SODIUM CHLORIDE (POUR BTL) OPTIME
TOPICAL | Status: DC | PRN
  Administered 2016-01-31: 1000 mL

## 2016-01-31 MED ORDER — ONDANSETRON HCL 4 MG/2ML IJ SOLN: 4.0000 mg | Freq: Four times a day (QID) | INTRAMUSCULAR | Status: DC | PRN

## 2016-01-31 MED ORDER — METOCLOPRAMIDE HCL 5 MG/ML IJ SOLN: 5.0000 mg | Freq: Three times a day (TID) | INTRAMUSCULAR | Status: DC | PRN

## 2016-01-31 MED ORDER — FENTANYL CITRATE (PF) 250 MCG/5ML IJ SOLN
INTRAMUSCULAR | Status: DC | PRN
  Administered 2016-01-31: 25 ug via INTRAVENOUS

## 2016-01-31 MED ORDER — LIDOCAINE 2% (20 MG/ML) 5 ML SYRINGE
INTRAMUSCULAR | Status: AC
  Filled 2016-01-31: qty 5

## 2016-01-31 MED ORDER — SODIUM CHLORIDE 0.9 % IV SOLN
INTRAVENOUS | Status: DC
  Administered 2016-01-31: 12:00:00 via INTRAVENOUS

## 2016-01-31 MED ORDER — DEXTROSE 50 % IV SOLN
INTRAVENOUS | Status: AC
  Administered 2016-01-31: 25 mL
  Filled 2016-01-31: qty 50

## 2016-01-31 MED ORDER — EPHEDRINE 5 MG/ML INJ
INTRAVENOUS | Status: AC
  Filled 2016-01-31: qty 10

## 2016-01-31 SURGICAL SUPPLY — 33 items
BLADE SURG 21 STRL SS (BLADE) ×2 IMPLANT
BNDG CMPR 9X4 STRL LF SNTH (GAUZE/BANDAGES/DRESSINGS)
BNDG COHESIVE 4X5 TAN STRL (GAUZE/BANDAGES/DRESSINGS) ×2 IMPLANT
BNDG COHESIVE 6X5 TAN STRL LF (GAUZE/BANDAGES/DRESSINGS) ×1 IMPLANT
BNDG ESMARK 4X9 LF (GAUZE/BANDAGES/DRESSINGS) IMPLANT
BNDG GAUZE ELAST 4 BULKY (GAUZE/BANDAGES/DRESSINGS) ×2 IMPLANT
COVER SURGICAL LIGHT HANDLE (MISCELLANEOUS) ×4 IMPLANT
DRAPE U-SHAPE 47X51 STRL (DRAPES) ×2 IMPLANT
DRSG ADAPTIC 3X8 NADH LF (GAUZE/BANDAGES/DRESSINGS) ×1 IMPLANT
DRSG PAD ABDOMINAL 8X10 ST (GAUZE/BANDAGES/DRESSINGS) ×2 IMPLANT
DURAPREP 26ML APPLICATOR (WOUND CARE) ×2 IMPLANT
ELECT REM PT RETURN 9FT ADLT (ELECTROSURGICAL) ×2
ELECTRODE REM PT RTRN 9FT ADLT (ELECTROSURGICAL) ×1 IMPLANT
GAUZE SPONGE 4X4 12PLY STRL (GAUZE/BANDAGES/DRESSINGS) IMPLANT
GLOVE BIOGEL PI IND STRL 9 (GLOVE) ×1 IMPLANT
GLOVE BIOGEL PI INDICATOR 9 (GLOVE) ×1
GLOVE SURG ORTHO 9.0 STRL STRW (GLOVE) ×2 IMPLANT
GOWN STRL REUS W/ TWL XL LVL3 (GOWN DISPOSABLE) ×2 IMPLANT
GOWN STRL REUS W/TWL XL LVL3 (GOWN DISPOSABLE) ×4
KIT BASIN OR (CUSTOM PROCEDURE TRAY) ×2 IMPLANT
KIT ROOM TURNOVER OR (KITS) ×2 IMPLANT
MANIFOLD NEPTUNE II (INSTRUMENTS) ×2 IMPLANT
NEEDLE 22X1 1/2 (OR ONLY) (NEEDLE) IMPLANT
NS IRRIG 1000ML POUR BTL (IV SOLUTION) ×2 IMPLANT
PACK ORTHO EXTREMITY (CUSTOM PROCEDURE TRAY) ×2 IMPLANT
PAD ARMBOARD 7.5X6 YLW CONV (MISCELLANEOUS) ×4 IMPLANT
SPONGE GAUZE 4X4 12PLY STER LF (GAUZE/BANDAGES/DRESSINGS) ×1 IMPLANT
SUCTION FRAZIER HANDLE 10FR (MISCELLANEOUS)
SUCTION TUBE FRAZIER 10FR DISP (MISCELLANEOUS) IMPLANT
SUT ETHILON 2 0 PSLX (SUTURE) ×2 IMPLANT
SYR CONTROL 10ML LL (SYRINGE) IMPLANT
TOWEL OR 17X24 6PK STRL BLUE (TOWEL DISPOSABLE) ×2 IMPLANT
TOWEL OR 17X26 10 PK STRL BLUE (TOWEL DISPOSABLE) ×2 IMPLANT

## 2016-01-31 NOTE — Op Note (Signed)
01/28/2016 - 01/31/2016  10:01 AM  PATIENT:  Adam Ray    PRE-OPERATIVE DIAGNOSIS:  Osteomyelitis Right 2nd Toe   POST-OPERATIVE DIAGNOSIS:  Same  PROCEDURE:  Right 2nd Toe Amputation  SURGEON:  Newt Minion, MD  PHYSICIAN ASSISTANT:None ANESTHESIA:   General  PREOPERATIVE INDICATIONS:  Adam Ray is a  60 y.o. male with a diagnosis of Osteomyelitis Right 2nd Toe  who failed conservative measures and elected for surgical management.    The risks benefits and alternatives were discussed with the patient preoperatively including but not limited to the risks of infection, bleeding, nerve injury, cardiopulmonary complications, the need for revision surgery, among others, and the patient was willing to proceed.  OPERATIVE IMPLANTS: None  OPERATIVE FINDINGS: Good petechial bleeding  OPERATIVE PROCEDURE: Patient brought the operating room and underwent general anesthetic. After adequate levels anesthesia obtained patient's right lower extremity was prepped using DuraPrep draped into a sterile field a timeout was called. A V incision was made around the toe this was carried down and the toe was obtained through the MTP joint. Electrocautery was used for hemostasis wound was irrigated with normal saline. The wound was closed using 2-0 nylon. A sterile compressive dressing was applied. Patient was extubated taken the PACU in stable condition.

## 2016-01-31 NOTE — Anesthesia Procedure Notes (Signed)
Procedure Name: LMA Insertion Date/Time: 01/31/2016 9:35 AM Performed by: Marcie Bal, ADAM Pre-anesthesia Checklist: Patient identified, Emergency Drugs available, Suction available and Patient being monitored Patient Re-evaluated:Patient Re-evaluated prior to inductionOxygen Delivery Method: Circle system utilized Preoxygenation: Pre-oxygenation with 100% oxygen Intubation Type: IV induction LMA: LMA inserted LMA Size: 4.0 Tube type: Oral Number of attempts: 1 Tube secured with: Tape Dental Injury: Teeth and Oropharynx as per pre-operative assessment

## 2016-01-31 NOTE — Progress Notes (Signed)
0400 CBG 72. Patient NPO for sx today. On call resident paged and ordered for IV D50 given. 45ml of D50 given initially and cbg rechecked came up to 101.

## 2016-01-31 NOTE — Progress Notes (Signed)
Family Medicine Teaching Service Daily Progress Note Intern Pager: [phone]  Patient name: Adam Ray Medical record number: [id number] Date of birth: 1955-08-20 Age: 60 y.o. Gender: male  Primary Care Provider: Chrisandra Netters, MD Consultants: Ortho Code Status: FULL  Pt Overview and Major Events to Date:  Admitted - 7/30 R toe amp - 2006 Scheduled for Ortho surg - 8/2 am  Assessment and Plan:  #Non purulent right leg cellulitis 2/2 second toe infection/osteomyelitis now post op day 1 due to poorly controlled diabetes. MRI 7/31 extensive subcutaneous soft tissue swelling/ edema/fluid involving dorsum of the forefoot and midfoot. No discrete drainable soft tissue abscess identified. Neg for DVT and ABIs WNL. S/p amputation of R 2nd toe at MTP --Follow up on blood cultures - NG x24hours --Continue vancomycin (Day 3) 750mg , consider deescalating for a more narrow coverage -Continue IV zosyn Q8 (Day 3) for Gram neg coverage, esp given hx of diabetes --am BMP wnl, CBC wnl, low wbc 3.4 (from 3.1 yesterday), low plat 120 --NS 125cc/hr --Tylenol 650mg  po or pr Q6 prn --Monitor vitals  --Consider restarting lasix for chronic edema - ortho following, appreciate recs for antibiotics  #Thrush, possibly due to poorly controlled DM vs malignancy. HIV Negative --Start patient on nystatin qid -Peripheral smear to r/o malignancy: In process  #Chronic Right plantar surface callus no current infection. -- Podiatry outpatient  #DM2 poorly controlled, stable. Last a1c on 7/31 is 10.5 -50U Lantus, held prior to surgery due to low CBGs, Resume when eating -sensitive SSI -CBGs Q4  #Asyptomatic bradycardia -EKG ordered 8/1: showed sinus bradycardia with left axis deviation -appears unchanged from previous EKG  #HTN well controlled Continue home regimen --Continue lisinopril 5mg  daily --Continue ASA 81 mg daily   #COPD, mild stable. not in exacerbation --Continue albuterol 2 puffs q6  prn  #HLD --Continue rosuvastatin 20mg  daily  FEN/GI: NPO'd at midnight for surgery this morning. Prophylaxis:Lovenox   Disposition: Keep inpatient for IV abx treatment of his cellulitis and osteo.   Subjective:  Patient states he feels well and has no current complaints. VSS overnight except for bradycardia. Remained afebrile. Currently going down to OR for surgery. Will follow up post-op.  Objective: Temp:  [97.6 F (36.4 C)-98.4 F (36.9 C)] 97.8 F (36.6 C) (08/02 0841) Pulse Rate:  [50-87] 87 (08/02 0841) Resp:  [17-18] 18 (08/02 0841) BP: (109-147)/(60-77) 114/77 (08/02 0841) SpO2:  [99 %-100 %] 100 % (08/02 0841)   Physical Exam: General: awake, alert, oriented in NAD Cardiovascular: Normal S1S2 no murmurs appreciated on exam Respiratory: CTA B/L no increased work of breathing, comfortable on room air Abdomen: soft, NTND +BS Extremities: Right lower extremity nonpitting edema, right larger than left. Right second toe wound no drainage or blood, poor palpable pulse on exam  Laboratory:  Recent Labs Lab 01/29/16 0508 01/30/16 0822 01/31/16 0549  WBC 3.1* 3.7* 3.4*  HGB 12.9* 13.2 12.4*  HCT 39.7 40.4 37.7*  PLT 119* 119* 120*    Recent Labs Lab 01/28/16 2036 01/29/16 0508 01/30/16 0822 01/31/16 0549  NA 135 135 136 139  K 4.7 4.2 4.0 3.7  CL 101 101 102 106  CO2 27 30 28 27   BUN 13 12 13 13   CREATININE 1.24 1.02 1.11 1.09  CALCIUM 9.0 8.4* 8.4* 8.6*  PROT 5.8*  --   --   --   BILITOT 0.8  --   --   --   ALKPHOS 129*  --   --   --  ALT 19  --   --   --   AST 28  --   --   --   GLUCOSE 361* 287* 201* 96     Imaging/Diagnostic Tests:  No results found.   Lovenia Kim, MD 01/31/2016, 9:27 AM PGY-1, [place] Intern pager: [phone], text pages welcome

## 2016-01-31 NOTE — H&P (View-Only) (Signed)
ORTHOPAEDIC CONSULTATION  REQUESTING PHYSICIAN: [name], MD  Chief Complaint: Ulceration swelling osteomyelitis right foot second toe  HPI: Adam Ray is a 60 y.o. male who presents with osteomyelitis ulceration right foot second toe. Patient states he is status post a first ray amputation about a 10 years ago. Patient states he has developed increasing swelling ulceration to the second toe right foot. 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  Past Medical History:  Diagnosis Date  . Allergy   . Arthritis   . Bronchitis   . Cataract   . Chronic kidney disease   . Claudication [facility])    right foot ray resection  . Colon polyps    hyperplastic  . COPD (chronic obstructive pulmonary disease) ([place])   . Diabetes mellitus   . Genital warts   . Gout    Past Surgical History:  Procedure Laterality Date  . I&D EXTREMITY  04/11/2012   Procedure: IRRIGATION AND DEBRIDEMENT EXTREMITY;  Surgeon: Wylene Simmer, MD;  Location: [place];  Service: Orthopedics;  Laterality: Right;  . Surgery left great toe    . Tear ducts bilateral eyes     Social History   Social History  . Marital status: Married    Spouse name: N/A  . Number of children: 3  . Years of education: N/A   Occupational History  . disabled    Social History Main Topics  . Smoking status: Former Smoker    Packs/day: 0.30    Years: 48.00    Types: Cigars    Start date: 07/02/1963  . Smokeless tobacco: Former Systems developer     Comment: wants to use electric cigarettes.  not interested in pills, worried about chantix side effects  . Alcohol use 0.0 oz/week  . Drug use: No  . Sexual activity: Not Asked   Other Topics Concern  . None   Social History Narrative  . None   Family History  Problem Relation Age of Onset  . Diabetes Mother   . Stroke Mother   . Heart failure Father    - negative except otherwise stated in the family history section No Known Allergies Prior to Admission  medications   Medication Sig Start Date End Date Taking? Authorizing Provider  acetaminophen (TYLENOL) 500 MG tablet Take 2 tablets (1,000 mg total) by mouth every 8 (eight) hours as needed. 10/02/15  Yes [name], MD  aspirin EC 81 MG tablet Take 81 mg by mouth daily.   Yes Historical Provider, MD  insulin aspart (NOVOLOG) 100 UNIT/ML injection Inject 30 Units into the skin 3 (three) times daily before meals. And pen needles 4/day Patient taking differently: Inject 25-40 Units into the skin 3 (three) times daily before meals. Sliding scale 12/08/15  Yes Renato Shin, MD  insulin glargine (LANTUS) 100 UNIT/ML injection Inject 0.7 mLs (70 Units total) into the skin at bedtime. Patient taking differently: Inject 100 Units into the skin daily.  12/08/15  Yes Renato Shin, MD  quinapril (ACCUPRIL) 5 MG tablet Take 0.5 tablets (2.5 mg total) by mouth daily. 07/27/15  Yes [name], MD  rosuvastatin (CRESTOR) 20 MG tablet Take 20 mg by mouth daily. 01/21/16  Yes Historical Provider, MD  ACCU-CHEK FASTCLIX LANCETS [place]  12/30/14   Historical Provider, MD  ACCU-CHEK SOFTCLIX LANCETS lancets Use as instructed 11/16/15   [name], MD  albuterol (PROVENTIL HFA;VENTOLIN HFA) 108 (90 Base) MCG/ACT inhaler Inhale 2 puffs into the lungs every  6 (six) hours as needed for wheezing or shortness of breath. Patient not taking: Reported on 01/29/2016 07/27/15   [name], MD  Alcohol Swabs (B-D SINGLE USE SWABS REGULAR) PADS  12/30/14   Historical Provider, MD  BD INSULIN SYRINGE ULTRAFINE 31G X 5/16" 0.3 ML MISC  12/30/14   Historical Provider, MD  Blood Glucose Calibration (ACCU-CHEK SMARTVIEW CONTROL) LIQD  12/30/14   Historical Provider, MD  Blood Glucose Monitoring Suppl (ACCU-CHEK AVIVA) device Use as instructed to check blood sugar 3 times per day 11/15/15 11/14/16  [name], MD  chlorhexidine (PERIDEX) 0.12 % solution Use as directed 10 mLs in the mouth or throat 4 (four) times  daily. Patient not taking: Reported on 01/29/2016 07/27/15   [name], MD  furosemide (LASIX) 20 MG tablet TAKE 1 TABLET BY MOUTH EVERY DAY MAY TAKE UP TO 2 PER DAY Patient not taking: Reported on 01/29/2016 07/27/15   [name], MD  glucose blood (ACCU-CHEK AVIVA) test strip Use as instructed 11/16/15   [name], MD  HYDROcodone-acetaminophen (NORCO) 5-325 MG tablet Take 1 tablet by mouth every 6 (six) hours as needed for moderate pain. Patient not taking: Reported on 01/29/2016 08/11/15   Melony Overly, MD  meloxicam (MOBIC) 7.5 MG tablet Take 1 tablet (7.5 mg total) by mouth daily. Patient not taking: Reported on 01/29/2016 08/11/15   Melony Overly, MD  methocarbamol (ROBAXIN) 750 MG tablet Take 1 tablet (750 mg total) by mouth every 6 (six) hours as needed for muscle spasms (and pain). Patient not taking: Reported on 01/29/2016 11/08/14   Clayton Bibles, PA-C  naproxen (NAPROSYN) 500 MG tablet Take 1 tablet (500 mg total) by mouth 2 (two) times daily with a meal. Patient not taking: Reported on 01/29/2016 08/10/15   Rosemarie Ax, MD  sildenafil (VIAGRA) 100 MG tablet TAKE 1 TABLET 1 HOUR BEFORE SEXUAL RELATIONS Patient not taking: Reported on 01/29/2016 05/08/15   [name], MD  traMADol (ULTRAM) 50 MG tablet Take 1 tablet (50 mg total) by mouth every 8 (eight) hours as needed. Patient not taking: Reported on 01/29/2016 11/15/15   [name], MD  zoster vaccine live, PF, (ZOSTAVAX) 60454 UNT/0.65ML injection Inject 19,400 Units into the skin once. Patient not taking: Reported on 01/29/2016 09/15/15   [name], MD   [name] Contrast  Result Date: 01/29/2016 CLINICAL DATA:  Open wound on the second toe. Diabetic with history of prior first ray amputation. EXAM: MRI OF THE RIGHT FOREFOOT WITHOUT AND WITH CONTRAST TECHNIQUE: Multiplanar, multisequence MR imaging was performed both before and after administration of intravenous contrast. CONTRAST:   60mL MULTIHANCE GADOBENATE DIMEGLUMINE 529 MG/ML IV SOLN COMPARISON:  Radiographs 01/29/2016 FINDINGS: Extensive subcutaneous soft tissue swelling/ edema/fluid involving the dorsum of the forefoot and midfoot. No discrete drainable soft tissue abscess is identified. Diffuse myositis without findings for pyomyositis. T2 signal abnormality and enhancement involving the second middle and distal phalanges consistent with osteomyelitis. Mild degenerative changes at the second metatarsal phalangeal joint with small joint effusion but no definite findings for septic arthritis. Moderate midfoot degenerative changes are noted. IMPRESSION: 1. MR findings consistent with osteomyelitis involving the middle and distal phalanges of the second toe. 2. Severe cellulitis without discrete abscess. 3. Myofasciitis without findings for pyomyositis. 4. No definite MR findings for septic arthritis. Electronically Signed   By: Marijo Sanes M.D.   On: 01/29/2016 14:25   Dg Foot Complete Right  Result Date: 01/29/2016 CLINICAL DATA:  RIGHT leg swelling and second toe infection for 1 week. History of diabetes, gout. EXAM: RIGHT FOOT COMPLETE - 3+ VIEW COMPARISON:  RIGHT foot radiograph March 01, 2004 FINDINGS: Erosion, sclerosis, flattening of the second distal phalanx tuft associated with apparent ulceration and packing material. No acute fracture deformity or dislocation. Status post first digit amputation. Pes planus on nonweightbearing radiographs. Diffuse soft tissue swelling. No radiopaque foreign bodies. IMPRESSION: Suspected chronic osteomyelitis second distal phalanx with overlying ulceration. Findings could be confirmed with MRI as clinically indicated. Diffuse soft tissue swelling. Electronically Signed   By: Elon Alas M.D.   On: 01/29/2016 00:45  - pertinent xrays, CT, MRI studies were reviewed and independently interpreted  Positive ROS: All other systems have been reviewed and were otherwise negative with  the exception of those mentioned in the HPI and as above.  Physical Exam: General: Alert, no acute distress Cardiovascular: Massive venous stasis edema right lower extremity. Respiratory: No cyanosis, no use of accessory musculature GI: No organomegaly, abdomen is soft and non-tender Skin: Patient has sausage digit swelling of the second toe with ulceration which probes to bone at the tuft of the second toe. Neurologic: Patient does not have protective sensation. Psychiatric: Patient is competent for consent with normal mood and affect Lymphatic: No axillary or cervical lymphadenopathy  MUSCULOSKELETAL:  On examination patient has massive swelling in the foot and ankle leg he has venous stasis changes with brawny skin color changes. He does have a palpable dorsalis pedis pulse. He has fixed clawing of the second toe status post first ray amputation. Review of the radiographs and MRI scan shows osteomyelitis involving the second toe with no ascending abscess.  Assessment: Assessment: Diabetic insensate neuropathy with venous stasis insufficiency with history [place] continued smoking with osteomyelitis ulceration and swelling right foot second toe status post first ray amputation 10 years ago.  Plan: Plan: We'll plan for a second toe amputation at the MTP joint most likely Wednesday morning. Continue IV antibiotics until surgery. Discussed with the patient the critical nature of controlling his glucose and stopping smoking. Discussed that with continued for glucose control and continued smoking patient is at increased risk for a transtibial amputation. Patient states that he is concerned that if he did stop smoking and control his diabetes he may still have complications.  Thank you for the consult and the opportunity to see Mr. Ackworth, MD [facility] [phone] 5:39 PM

## 2016-01-31 NOTE — Progress Notes (Signed)
Pt going to the OR for procedure. Report called off to [facility] in short stay. Francis Gaines Raiyah Speakman RN.

## 2016-01-31 NOTE — Anesthesia Postprocedure Evaluation (Signed)
Anesthesia Post Note  Patient: [name]  Procedure(s) Performed: Procedure(s) (LRB): Right 2nd Toe Amputation (Right)  Patient location during evaluation: PACU Anesthesia Type: General Level of consciousness: awake and alert and patient cooperative Pain management: pain level controlled Vital Signs Assessment: post-procedure vital signs reviewed and stable Respiratory status: spontaneous breathing and respiratory function stable Cardiovascular status: stable Anesthetic complications: no    Last Vitals:  Vitals:   01/31/16 1021 01/31/16 1022  BP:  137/71  Pulse:  (!) 53  Resp:  17  Temp: 36.3 C     Last Pain:  Vitals:   01/31/16 0841  TempSrc: Oral  PainSc:                  Galliano S

## 2016-01-31 NOTE — Progress Notes (Signed)
Pt with ring on left ring finger. Unable to remove, ring is taped.

## 2016-01-31 NOTE — Progress Notes (Signed)
Patient wife upset with staff and felt that we are disrespecting her. I informed her staff are just performing our routine duties which includes checking in on patient, assisting with ADL's, and offering help as needed by patient. Patient's wife stated she is his wife and can do all those things for him and is very upset staff offered to wipe patient with CHG wipes and when staff checked on patient while he was in the bathroom. Charge nurse made aware as well as AC.

## 2016-01-31 NOTE — Interval H&P Note (Signed)
History and Physical Interval Note:  01/31/2016 6:58 AM  Adam Ray  has presented today for surgery, with the diagnosis of Osteomyelitis Right 2nd Toe   The various methods of treatment have been discussed with the patient and family. After consideration of risks, benefits and other options for treatment, the patient has consented to  Procedure(s): Right 2nd Toe Amputation (Right) as a surgical intervention .  The patient's history has been reviewed, patient examined, no change in status, stable for surgery.  I have reviewed the patient's chart and labs.  Questions were answered to the patient's satisfaction.     DUDA,MARCUS V

## 2016-01-31 NOTE — Transfer of Care (Signed)
Immediate Anesthesia Transfer of Care Note  Patient: Adam Ray  Procedure(s) Performed: Procedure(s): Right 2nd Toe Amputation (Right)  Patient Location: PACU  Anesthesia Type:General  Level of Consciousness: awake, alert , oriented and patient cooperative  Airway & Oxygen Therapy: Patient Spontanous Breathing and Patient connected to nasal cannula oxygen  Post-op Assessment: Report given to RN, Post -op Vital signs reviewed and stable, Patient moving all extremities X 4 and Patient able to stick tongue midline  Post vital signs: Reviewed and stable  Last Vitals:  Vitals:   01/31/16 0500 01/31/16 0841  BP: 109/60 114/77  Pulse: (!) 50 87  Resp: 17 18  Temp: 36.4 C 36.6 C    Last Pain:  Vitals:   01/31/16 0841  TempSrc: Oral  PainSc:       Patients Stated Pain Goal: 0 ([id number])  Complications: No apparent anesthesia complications

## 2016-01-31 NOTE — Anesthesia Preprocedure Evaluation (Signed)
Anesthesia Evaluation  Patient identified by MRN, date of birth, ID band Patient awake    Reviewed: Allergy & Precautions, NPO status , Patient's Chart, lab work & pertinent test results  Airway Mallampati: II   Neck ROM: full    Dental   Pulmonary sleep apnea , COPD, former smoker,    breath sounds clear to auscultation       Cardiovascular + Peripheral Vascular Disease   Rhythm:regular Rate:Normal     Neuro/Psych Depression  Neuromuscular disease    GI/Hepatic   Endo/Other  diabetes, Type 2  Renal/GU      Musculoskeletal  (+) Arthritis ,   Abdominal   Peds  Hematology   Anesthesia Other Findings   Reproductive/Obstetrics                             Anesthesia Physical Anesthesia Plan  ASA: III  Anesthesia Plan: General   Post-op Pain Management:    Induction: Intravenous  Airway Management Planned: LMA  Additional Equipment:   Intra-op Plan:   Post-operative Plan:   Informed Consent: I have reviewed the patients History and Physical, chart, labs and discussed the procedure including the risks, benefits and alternatives for the proposed anesthesia with the patient or authorized representative who has indicated his/her understanding and acceptance.     Plan Discussed with: CRNA, Anesthesiologist and Surgeon  Anesthesia Plan Comments:         Anesthesia Quick Evaluation

## 2016-01-31 NOTE — Progress Notes (Signed)
Orthopedic Tech Progress Note Patient Details:  Adam Ray 01-01-1956 [id number]  Ortho Devices Type of Ortho Device: Postop shoe/boot Ortho Device/Splint Location: rle Ortho Device/Splint Interventions: Application   Arelyn Gauer 01/31/2016, 11:47 AM

## 2016-02-01 ENCOUNTER — Encounter (HOSPITAL_COMMUNITY): Payer: Self-pay | Admitting: Orthopedic Surgery

## 2016-02-01 ENCOUNTER — Telehealth: Payer: Self-pay | Admitting: Family Medicine

## 2016-02-01 DIAGNOSIS — E1149 Type 2 diabetes mellitus with other diabetic neurological complication: Secondary | ICD-10-CM

## 2016-02-01 LAB — GLUCOSE, CAPILLARY
GLUCOSE-CAPILLARY: 207 mg/dL — AB (ref 65–99)
Glucose-Capillary: 114 mg/dL — ABNORMAL HIGH (ref 65–99)
Glucose-Capillary: 150 mg/dL — ABNORMAL HIGH (ref 65–99)
Glucose-Capillary: 219 mg/dL — ABNORMAL HIGH (ref 65–99)
Glucose-Capillary: 231 mg/dL — ABNORMAL HIGH (ref 65–99)

## 2016-02-01 LAB — BASIC METABOLIC PANEL
Anion gap: 7 (ref 5–15)
BUN: 12 mg/dL (ref 6–20)
CALCIUM: 8.7 mg/dL — AB (ref 8.9–10.3)
CO2: 27 mmol/L (ref 22–32)
CREATININE: 1.09 mg/dL (ref 0.61–1.24)
Chloride: 104 mmol/L (ref 101–111)
Glucose, Bld: 131 mg/dL — ABNORMAL HIGH (ref 65–99)
Potassium: 3.9 mmol/L (ref 3.5–5.1)
SODIUM: 138 mmol/L (ref 135–145)

## 2016-02-01 LAB — CBC
HCT: 37.6 % — ABNORMAL LOW (ref 39.0–52.0)
HEMOGLOBIN: 12.5 g/dL — AB (ref 13.0–17.0)
MCH: 29.1 pg (ref 26.0–34.0)
MCHC: 33.2 g/dL (ref 30.0–36.0)
MCV: 87.6 fL (ref 78.0–100.0)
PLATELETS: 130 10*3/uL — AB (ref 150–400)
RBC: 4.29 MIL/uL (ref 4.22–5.81)
RDW: 13.1 % (ref 11.5–15.5)
WBC: 4.4 10*3/uL (ref 4.0–10.5)

## 2016-02-01 MED ORDER — INSULIN ASPART 100 UNIT/ML ~~LOC~~ SOLN: 0.0000 [IU] | Freq: Every day | SUBCUTANEOUS | Status: DC

## 2016-02-01 MED ORDER — OXYCODONE HCL 5 MG PO TABS: 5.0000 mg | ORAL_TABLET | Freq: Four times a day (QID) | ORAL | 0 refills | Status: DC | PRN

## 2016-02-01 MED ORDER — INSULIN GLARGINE 100 UNIT/ML ~~LOC~~ SOLN: 40.0000 [IU] | Freq: Every day | SUBCUTANEOUS | 11 refills | Status: DC

## 2016-02-01 MED ORDER — INSULIN ASPART 100 UNIT/ML ~~LOC~~ SOLN: 3.0000 [IU] | Freq: Three times a day (TID) | SUBCUTANEOUS | 11 refills | Status: DC

## 2016-02-01 MED ORDER — INSULIN ASPART 100 UNIT/ML ~~LOC~~ SOLN
0.0000 [IU] | Freq: Three times a day (TID) | SUBCUTANEOUS | Status: DC
  Administered 2016-02-01 (×2): 5 [IU] via SUBCUTANEOUS

## 2016-02-01 NOTE — Evaluation (Signed)
Physical Therapy Evaluation Patient Details Name: Adam Ray MRN: [id number] DOB: 1955-09-27 Today's Date: 02/01/2016   History of Present Illness  pt presents with R 2nd Toe Amputation.  pt with hx of Gout, DM, COPD, CKD, and R 1st Ray Resection.  Clinical Impression  Pt eager for mobility and D/C today.  Pt does well maintaining TDWBing during ambulation, but needed cueing during transfers.  Feel pt is able to D/C today from a PT stand point, but would continue to follow if remains on acute.      Follow Up Recommendations Home health PT;Supervision - Intermittent    Equipment Recommendations  Rolling walker with 5" wheels    Recommendations for Other Services       Precautions / Restrictions Precautions Precautions: Fall Required Braces or Orthoses: Other Brace/Splint Other Brace/Splint: R Post-Op shoe Restrictions Weight Bearing Restrictions: Yes RLE Weight Bearing: Touchdown weight bearing      Mobility  Bed Mobility Overal bed mobility: Modified Independent             General bed mobility comments: pt needs increased time, but able to complete without A.    Transfers Overall transfer level: Needs assistance Equipment used: Rolling walker (2 wheeled) Transfers: Sit to/from Stand Sit to Stand: Supervision         General transfer comment: cues for UE use and maintaining TDWB.    Ambulation/Gait Ambulation/Gait assistance: Supervision Ambulation Distance (Feet): 70 Feet Assistive device: Rolling walker (2 wheeled) Gait Pattern/deviations: Step-to pattern     General Gait Details: pt able to maintain TDWBing during ambulation.  Cues for positioning within RW and for more upright posture.    Stairs            Wheelchair Mobility    Modified Rankin (Stroke Patients Only)       Balance Overall balance assessment: Needs assistance Sitting-balance support: No upper extremity supported;Feet supported Sitting balance-Leahy Scale: Good      Standing balance support: Bilateral upper extremity supported;During functional activity Standing balance-Leahy Scale: Poor                               Pertinent Vitals/Pain Pain Assessment: Faces Faces Pain Scale: Hurts a little bit Pain Location: pt indicates "not bad" when asked to rate. Pain Intervention(s): Monitored during session;Premedicated before session;Repositioned    Home Living Family/patient expects to be discharged to:: Private residence Living Arrangements: Spouse/significant other Available Help at Discharge: Family;Available PRN/intermittently Type of Home: House Home Access: Stairs to enter Entrance Stairs-Rails: None Entrance Stairs-Number of Steps: 1 Home Layout: One level Home Equipment: Cane - single point      Prior Function Level of Independence: Independent               Hand Dominance        Extremity/Trunk Assessment   Upper Extremity Assessment: Overall WFL for tasks assessed           Lower Extremity Assessment: RLE deficits/detail RLE Deficits / Details: ankle/foot limited by edema and dressing, but otherwise LE with good functional strength and ROM.      Cervical / Trunk Assessment: Normal  Communication   Communication: No difficulties  Cognition Arousal/Alertness: Awake/alert Behavior During Therapy: WFL for tasks assessed/performed Overall Cognitive Status: Within Functional Limits for tasks assessed                      General Comments  Exercises        Assessment/Plan    PT Assessment Patient needs continued PT services  PT Diagnosis Difficulty walking   PT Problem List Decreased strength;Decreased activity tolerance;Decreased balance;Decreased mobility;Decreased coordination;Decreased knowledge of use of DME  PT Treatment Interventions DME instruction;Gait training;Stair training;Functional mobility training;Therapeutic activities;Therapeutic exercise;Balance  training;Patient/family education   PT Goals (Current goals can be found in the Care Plan section) Acute Rehab PT Goals Patient Stated Goal: Home today PT Goal Formulation: With patient Time For Goal Achievement: 02/08/16 Potential to Achieve Goals: Good    Frequency Min 3X/week   Barriers to discharge        Co-evaluation               End of Session Equipment Utilized During Treatment: Gait belt Activity Tolerance: Patient tolerated treatment well Patient left: in chair;with call bell/phone within reach Nurse Communication: Mobility status         Time: [id number] PT Time Calculation (min) (ACUTE ONLY): 27 min   Charges:   PT Evaluation $PT Eval Low Complexity: 1 Procedure PT Treatments $Gait Training: 8-22 mins   PT G CodesCatarina Hartshorn, [place] 02/01/2016, 10:21 AM

## 2016-02-01 NOTE — Progress Notes (Signed)
Family Medicine Teaching Service Daily Progress Note Intern Pager: [phone]  Patient name: Adam Ray Medical record number: [id number] Date of birth: [birth year] Age: 60 y.o. Gender: male  Primary Care Provider: Chrisandra Netters, MD Consultants: Ortho Code Status: FULL  Pt Overview and Major Events to Date:  Admitted - 7/30 R toe amp - 2006 Ortho surg s/p right 2nd toe amp - 8/2 Vanc and zosyn started 7/31  Assessment and Plan:  #Non purulent right leg cellulitis 2/2 second toe infection/osteomyelitis now post op day 1 due to poorly controlled diabetes. MRI 7/31 extensive subcutaneous soft tissue swelling/ edema/fluid involving dorsum of the forefoot and midfoot. No discrete drainable soft tissue abscess identified. Neg for DVT and ABIs WNL. POD#1: S/p amputation of R 2nd toe at MTP --Follow up on blood cultures - NG 2days --Continue vancomycin (Day 4) 750mg   -Continue IV zosyn Q8 (Day 4) for Gram neg coverage, esp given hx of diabetes --am BMP wnl, Cr 1.09 -CBC wnl, low wbc 4.4 (from 3.1 yesterday), low plat 120 --NS 125cc/hr, KVO when tolerating po --Tylenol 650mg  po or pr Q6 prn -Oxycodone 5-10mg  po Q3 for breakthrough pain --Monitor vitals - ortho following, 24h of post op IV abx needed while inpatient, no abx needed upon d/c -PT seen, recc continued PT services due to difficulty walking, walker recommended -follow up appointment next week with Ortho for wound recheck  #Thrush, possibly due to poorly controlled DM vs malignancy. HIV Negative --Start patient on nystatin qid -Peripheral smear to r/o malignancy: Normal  #Chronic Right plantar surface callus no current infection. -- Podiatry outpatient  #DM2 poorly controlled, stable. Last a1c on 7/31 is 10.5 -50U Lantus, held prior to surgery due to low CBGs, Resumed 35U when started eating yesterday -sensitive SSI -CBGs Q4 - blood sugars in 200s but stable overnight -will be discharged on 40 U Lantus at bedtime and 3 U  Novolog pre-meal, with close follow up -Pharmacy follow up and PCP follow up scheduled.  #Asyptomatic bradycardia -EKG ordered 8/1: showed sinus bradycardia with left axis deviation -appears unchanged from previous EKG  #HTN well controlled Continue home regimen --Continue lisinopril 5mg  daily --Continue ASA 81 mg daily   #COPD, mild stable. not in exacerbation --Continue albuterol 2 puffs q6 prn   #HLD --Continue rosuvastatin 20mg  daily  FEN/GI: Carb modified Prophylaxis:Lovenox   Disposition: Keep inpatient for post-op monitoring and 24h of IV abx. Likely discharge once PT sees him today to minimize weight bearing on RLE. Will need home health PT and follow up with Ortho in 1 week.  Subjective:  Patient states he feels well and has no current complaints. VSS overnight except for bradycardia. Remained afebrile.   Objective: Temp:  [97.5 F (36.4 C)-98.6 F (37 C)] 97.5 F (36.4 C) (08/03 1301) Pulse Rate:  [57-76] 76 (08/03 1301) Resp:  [15-18] 18 (08/03 1301) BP: (103-159)/(56-101) 115/59 (08/03 1301) SpO2:  [97 %-100 %] 98 % (08/03 1301)   Physical Exam: General: awake, alert, oriented in NAD Cardiovascular: Normal S1S2 no murmurs appreciated on exam Respiratory: CTA B/L no increased work of breathing, comfortable on room air Abdomen: soft, NTND +BS Extremities: RLE with wound dressing and elevated  Laboratory:  Recent Labs Lab 01/30/16 0822 01/31/16 0549 02/01/16 0339  WBC 3.7* 3.4* 4.4  HGB 13.2 12.4* 12.5*  HCT 40.4 37.7* 37.6*  PLT 119* 120* 130*    Recent Labs Lab 01/28/16 2036  01/30/16 0822 01/31/16 0549 02/01/16 0339  NA 135  < > 136 139  138  K 4.7  < > 4.0 3.7 3.9  CL 101  < > 102 106 104  CO2 27  < > 28 27 27   BUN 13  < > 13 13 12   CREATININE 1.24  < > 1.11 1.09 1.09  CALCIUM 9.0  < > 8.4* 8.6* 8.7*  PROT 5.8*  --   --   --   --   BILITOT 0.8  --   --   --   --   ALKPHOS 129*  --   --   --   --   ALT 19  --   --   --   --   AST  28  --   --   --   --   GLUCOSE 361*  < > 201* 96 131*  < > = values in this interval not displayed.   Imaging/Diagnostic Tests:  No results found.   Lovenia Kim, MD 02/01/2016, 4:37 PM PGY-1, [place] Intern pager: [phone], text pages welcome

## 2016-02-01 NOTE — Progress Notes (Signed)
Pharmacy Antibiotic Note  Adam Ray is a 60 y.o. male admitted on 01/28/2016 with cellulitis.  Pharmacy was consulted on 01/29/16 for Vancomycin dosing. Goal VT = 10-15 mcg/ml WBC remains wnl.  Afebrile. Blood cultures are negative to date. SCr 1.09, renal function stable.  S/p Right 2nd Toe Amputation on 01/31/16   Plan: Continue Vancomycin 750mg  IV q12h Continue  Zosyn 3.365g IV Q8H. Will f/u micro data, renal function, and pt's clinical condition Vanc trough prn, check trough by 8/5   Height: 5\' 10"  (177.8 cm) Weight: 171 lb (77.6 kg) IBW/kg (Calculated) : 73  Temp (24hrs), Avg:98.1 F (36.7 C), Min:97.5 F (36.4 C), Max:98.6 F (37 C)   Recent Labs Lab 01/28/16 2036 01/28/16 2047 01/29/16 0156 01/29/16 0508 01/30/16 0822 01/31/16 0549 02/01/16 0339  WBC 3.8*  --   --  3.1* 3.7* 3.4* 4.4  CREATININE 1.24  --   --  1.02 1.11 1.09 1.09  LATICACIDVEN  --  1.47 1.64  --   --   --   --     Estimated Creatinine Clearance: 74.4 mL/min (by C-G formula based on SCr of 1.09 mg/dL).    No Known Allergies  Antimicrobials this admission: 7/31 Vanc >>  7/31 Zosyn >>  Dose adjustments this admission: n/a  Microbiology results: 7/31 BCx x2:  ngtd  8/1 MRSA PCR neg  Thank you for allowing pharmacy to be a part of this patient's care. Nicole Cella, RPh Clinical Pharmacist Pager: [phone] 02/01/2016 2:49 PM

## 2016-02-01 NOTE — Telephone Encounter (Signed)
Received notice that patient was in the hospital and would like to speak with me.  I called his hospital room. He mentioned that he needs to speak with me about "getting financial help". It wasn't very clear what he meant by this even after I asked him to clarify a few times. He has a hospital follow up appointment scheduled on 8/8. We can address his concerns then. He appreciated the call.  [name], MD

## 2016-02-01 NOTE — Discharge Summary (Signed)
[facility] Discharge Summary  Patient name: Adam Ray Medical record number: [id number] Date of birth: 05/05/1956 Age: 60 y.o. Gender: male Date of Admission: 01/28/2016  Date of Discharge: 02/01/2016 Admitting Physician: Lupita Dawn, MD  Primary Care Provider: Chrisandra Netters, MD Consultants: Manson Passey, Cardiovasc.  Indication for Hospitalization: Diabetic foot infection  Discharge Diagnoses/Problem List:  Osteomyelitis RLE Cellulitis T2DM, poorly controlled  Disposition: Discharge to home.  Discharge Condition: Improved, stable.  Discharge Exam:   Gen- alert,oriented, in no apparent distress Skin - swelling and edema in RLE from knee down, some cracking skin lacerations on shin that are weeping Eyes - pupils equal and reactive, extraocular eye movements intact, no conjunctival injection Ears - bilateral TM's and external ear canals normal Nose - normal and patent, no erythema, discharge or rhinnorhea Mouth - mucous membranes moist, pharynx normal without lesions Neck - supple, no significant adenopathy Chest - clear to auscultation bilaterally, no wheezes, rales or rhonchi, symmetric air entry Heart - normal rate, regular rhythm, normal S1, S2, no murmurs, rubs, clicks or gallops Abdomen - soft, nontender, nondistended, no masses or organomegaly, +BS Musculoskeletal - RLE edema and swelling , with wound dressing intact over right foot.   Brief Hospital Course:   Adam Ray is a 60 y.o. male with a past medical history significant for osteomyelitis s/p first right toe ray amputation, T2DM, Hyperlipidemia, Hypertension, and COPD who presented with right leg swelling and redness consistent with cellulitis most likely secondary to an infection of his second right toe.    Non purulent right leg cellulitis secondary to second toe infection/osteomyelitis Adam Ray presented with acute onset of right leg swelling with some redness, cracked skin  accompanied with some weeping. On exam, his right leg was larger than left, warm and tight with poor palpable pulses. No pitting edema noted on exam. He had a right second toe wound concerning for infection. Patient denied pain, fever or chills and WBC was within normal limits with a normal lactic acid level of 1.69. He did not meet SIRS criteria. Bedside Doppler was done and  pulses were detected. Right foot X ray showed possible chronic osteomyelitis of the second distal phalanx with overlying ulceration. Patient with no history of prolonged immobility that would be suggestive of a DVT. Patient denied any shortness of breath or pain associated with his leg swelling. He was admitted to family medicine teaching service for observation and workup of his infection. Blood cx were obtained and Vancomycin was started. Zosyn was added for gram negative coverage especially given his history of poorly controlled diabetes. An arterial doppler of RLE and MRI of right foot were ordered which showed no drainable soft tissue abscesses. Orthopedics surgery was consulted and  recommended amputation of right second toe. Surgery was scheduled for following morning and patient tolerated the procedure well. He was kept on IV antibiotics for the next 24 hours. At time of discharge, he was stable and pain was well controlled with medication. Physical therapy was consulted and home health was arranged to help him with his care.   Oral Thrush Patient had white plaques on his tongue consistent with what appears to be candida infection. Likely due to history of uncontrolled diabetes which could explain etiology. HIV testing negative. He was started on Nystatin QID while admitted. Peripheral blood smear ordered to rule out malignancy which was negative.  DMT2 Adam Ray had a history of poor Type 2 DM control. His last A1c 8.9 (11/15/2015). Repeat A1c was  10.5. While hospitalized, he was on 35 U Lantus and SSI. Upon discharge he was on 40 U  Lantus and 3 U Novolog three times daily with meals. Requires close follow up for tighter glycemic control due to susceptibility of infections.  HTN Blood pressure on admission 129/80, well controlled. Home regimen was continued inpatient.  COPD Adam Ray had no new oxygen requirement on admission. He denied shortness of breath and cough. Home regimen continued while inpatient.   HLD Patient has history of hyperlipidemia, continued on his home rosuvastatin 20mg  daily while inpatient.   Issues for Follow Up:  Will need close follow up for management of his T2DM due to noncompliance.  Significant Procedures: Right 2nd toe amputation.  Significant Labs and Imaging:   Recent Labs Lab 01/30/16 0822 01/31/16 0549 02/01/16 0339  WBC 3.7* 3.4* 4.4  HGB 13.2 12.4* 12.5*  HCT 40.4 37.7* 37.6*  PLT 119* 120* 130*    Recent Labs Lab 01/28/16 2036 01/29/16 0508 01/30/16 0822 01/31/16 0549 02/01/16 0339  NA 135 135 136 139 138  K 4.7 4.2 4.0 3.7 3.9  CL 101 101 102 106 104  CO2 27 30 28 27 27   GLUCOSE 361* 287* 201* 96 131*  BUN 13 12 13 13 12   CREATININE 1.24 1.02 1.11 1.09 1.09  CALCIUM 9.0 8.4* 8.4* 8.6* 8.7*  ALKPHOS 129*  --   --   --   --   AST 28  --   --   --   --   ALT 19  --   --   --   --   ALBUMIN 3.6  --   --   --   --     Results/Tests Pending at Time of Discharge: None.  Discharge Medications:    Medication List    STOP taking these medications   chlorhexidine 0.12 % solution Commonly known as:  PERIDEX   furosemide 20 MG tablet Commonly known as:  LASIX   HYDROcodone-acetaminophen 5-325 MG tablet Commonly known as:  NORCO   meloxicam 7.5 MG tablet Commonly known as:  MOBIC   methocarbamol 750 MG tablet Commonly known as:  ROBAXIN   naproxen 500 MG tablet Commonly known as:  NAPROSYN   sildenafil 100 MG tablet Commonly known as:  VIAGRA   traMADol 50 MG tablet Commonly known as:  ULTRAM   zoster vaccine live (PF) 19400 UNT/0.65ML  injection Commonly known as:  ZOSTAVAX     TAKE these medications   ACCU-CHEK AVIVA device Use as instructed to check blood sugar 3 times per day   ACCU-CHEK FASTCLIX LANCETS Misc   ACCU-CHEK SOFTCLIX LANCETS lancets Use as instructed   ACCU-CHEK SMARTVIEW CONTROL Liqd   acetaminophen 500 MG tablet Commonly known as:  TYLENOL Take 2 tablets (1,000 mg total) by mouth every 8 (eight) hours as needed.   albuterol 108 (90 Base) MCG/ACT inhaler Commonly known as:  PROVENTIL HFA;VENTOLIN HFA Inhale 2 puffs into the lungs every 6 (six) hours as needed for wheezing or shortness of breath.   aspirin EC 81 MG tablet Take 81 mg by mouth daily.   B-D SINGLE USE SWABS REGULAR Pads   BD INSULIN SYRINGE ULTRAFINE 31G X 5/16" 0.3 ML Misc Generic drug:  Insulin Syringe-Needle U-100   glucose blood test strip Commonly known as:  ACCU-CHEK AVIVA Use as instructed   insulin aspart 100 UNIT/ML injection Commonly known as:  NOVOLOG Inject 3 Units into the skin 3 (three) times daily before meals. And pen  needles 4/day What changed:  how much to take   insulin glargine 100 UNIT/ML injection Commonly known as:  LANTUS Inject 0.4 mLs (40 Units total) into the skin at bedtime. What changed:  how much to take   oxyCODONE 5 MG immediate release tablet Commonly known as:  Oxy IR/ROXICODONE Take 1 tablet (5 mg total) by mouth every 6 (six) hours as needed for breakthrough pain.   quinapril 5 MG tablet Commonly known as:  ACCUPRIL Take 0.5 tablets (2.5 mg total) by mouth daily.   rosuvastatin 20 MG tablet Commonly known as:  CRESTOR Take 20 mg by mouth daily.      Discharge Instructions: Please refer to Patient Instructions section of EMR for full details.  Patient was counseled important signs and symptoms that should prompt return to medical care, changes in medications, dietary instructions, activity restrictions, and follow up appointments.   Follow-Up Appointments: Follow-up  Information    DUDA,MARCUS V, MD Follow up in 1 week(s).   Specialty:  Orthopedic Surgery Contact information: [place] [address] [phone]        [place] .   Why:  Someone from [facility] will contact you concerning start date and time for therapy. Contact information: [place] [address] [phone]        Chrisandra Netters, MD. Go on 02/06/2016.   Specialty:  Family Medicine Why:  Hospital follow up appointment at 10.15 am.  Contact information: [place] [address] [phone]          Lovenia Kim, MD 02/01/2016, 10:18 PM PGY-1, [place]

## 2016-02-01 NOTE — Care Management Important Message (Signed)
Important Message  Patient Details  Name: Adam Ray MRN: [id number] Date of Birth: 1955/12/14   Medicare Important Message Given:  Yes    [name] 02/01/2016, 10:56 AM

## 2016-02-01 NOTE — Progress Notes (Signed)
Pt discharge education and instructions completed with pt and spouse at bedside; both voices understanding and denies any questions. Pt IV removed; pt leg incision dsg remains clean, dry and intact with not stain or active bleeding noted. Pt handed his prescription for oxycodone. Pt discharge home with spouse to transport him to disposition. Delia Heady RN

## 2016-02-01 NOTE — Progress Notes (Signed)
Patient ID: Adam Ray, male   DOB: 03-23-1956, 60 y.o.   MRN: [id number] Postoperative day 1 amputation right foot second toe. Plan for physical therapy minimize weightbearing right lower extremity. Patient may discharge to home once he is safe with therapy anticipate he will need home health physical therapy. I will follow-up in the office in 1 week.

## 2016-02-01 NOTE — Care Management Note (Signed)
Case Management Note  Patient Details  Name: Adam Ray MRN: [id number] Date of Birth: 05-12-1956  Subjective/Objective:  60 yr old male s/p right second toe amputation.            Action/Plan: Case manager spoke with [employer] and wife [name] [facility] and DME needs. Referral for Home Health PT was called to Marcy Salvo, [facility]. Patient has requested to speak with financial counselor concerning assistance with bill. Social worker did inform financial counseling, they will followup with patient after he is discharged. Patient's cell # is [phone], home # [phone].   Expected Discharge Date:  01/31/16               Expected Discharge Plan:  [place]  In-House Referral:  Financial Counselor  Discharge planning Services  CM Consult  Post Acute Care Choice:  Durable Medical Equipment, Home Health Choice offered to:  Patient  DME Arranged:  [facility] Agency:  [facility] Arranged:  PT [facility] Agency:  [place]  Status of Service:  Completed, signed off  If discussed at [place] of Stay Meetings, dates discussed:    Additional Comments:  Ninfa Meeker, RN 02/01/2016, 1:08 PM

## 2016-02-03 LAB — CULTURE, BLOOD (ROUTINE X 2)
CULTURE: NO GROWTH
Culture: NO GROWTH

## 2016-02-05 ENCOUNTER — Encounter: Payer: Self-pay | Admitting: Family Medicine

## 2016-02-05 ENCOUNTER — Ambulatory Visit (INDEPENDENT_AMBULATORY_CARE_PROVIDER_SITE_OTHER): Payer: Commercial Managed Care - HMO | Admitting: Family Medicine

## 2016-02-05 VITALS — BP 131/80 | HR 65 | Temp 98.2°F | Wt 169.0 lb

## 2016-02-05 DIAGNOSIS — B370 Candidal stomatitis: Secondary | ICD-10-CM

## 2016-02-05 DIAGNOSIS — E118 Type 2 diabetes mellitus with unspecified complications: Secondary | ICD-10-CM | POA: Diagnosis not present

## 2016-02-05 DIAGNOSIS — M869 Osteomyelitis, unspecified: Secondary | ICD-10-CM

## 2016-02-05 DIAGNOSIS — Z794 Long term (current) use of insulin: Secondary | ICD-10-CM

## 2016-02-05 DIAGNOSIS — Z4789 Encounter for other orthopedic aftercare: Secondary | ICD-10-CM | POA: Diagnosis not present

## 2016-02-05 DIAGNOSIS — E785 Hyperlipidemia, unspecified: Secondary | ICD-10-CM | POA: Diagnosis not present

## 2016-02-05 DIAGNOSIS — I10 Essential (primary) hypertension: Secondary | ICD-10-CM | POA: Diagnosis not present

## 2016-02-05 DIAGNOSIS — J449 Chronic obstructive pulmonary disease, unspecified: Secondary | ICD-10-CM | POA: Diagnosis not present

## 2016-02-05 DIAGNOSIS — E1165 Type 2 diabetes mellitus with hyperglycemia: Secondary | ICD-10-CM | POA: Diagnosis not present

## 2016-02-05 DIAGNOSIS — Z89421 Acquired absence of other right toe(s): Secondary | ICD-10-CM | POA: Diagnosis not present

## 2016-02-05 DIAGNOSIS — IMO0002 Reserved for concepts with insufficient information to code with codable children: Secondary | ICD-10-CM

## 2016-02-05 MED ORDER — NYSTATIN 100000 UNIT/ML MT SUSP
5.0000 mL | Freq: Four times a day (QID) | OROMUCOSAL | 0 refills | Status: AC
Start: 2016-02-05 — End: 2016-02-12

## 2016-02-05 NOTE — Patient Instructions (Signed)
Sent in nystatin for you Four times a day for 1 week  Follow up with me in 1-2 months, sooner if you need anything  Be well, Dr. [name]

## 2016-02-05 NOTE — Progress Notes (Signed)
  HPI:  Adam Ray presents for hospital follow up. Patient was hospitalized from 01/28/16 to 02/01/16 with osteomyelitis of his right second toe. Underwent operative resection of this toe.   Patient now reports he's doing well. Has appointment with Dr. Sharol Given on Friday of this week. Getting around alright. Pain well controlled. Blood sugars are doing better. Sugars are in the 100s now.  Requests rx to treat thrush. Was treated in the hospital, presumably due to elevated blood sugars. States still has some discomfort in mouth and does not think it's fully resolved.   ROS: See HPI.  [place]: history of hypertension, COPD, bicuspid aortic valve, diabetic retinopathy, type 2 diabetes on insulin, tobacco abuse, PVD,   PHYSICAL EXAM: BP 131/80 (BP Location: Left Arm, Patient Position: Sitting, Cuff Size: Normal)   Pulse 65   Temp 98.2 F (36.8 C) (Oral)   Wt 169 lb (76.7 kg)   BMI 24.25 kg/m  Gen: NAD, pleasant, cooperative HEENT: normocephalic, atraumatic. Oropharynx clear, no visible thrush lesions Ext: RLE wrapped in dressing, did not remove  ASSESSMENT/PLAN:  1. Osteomyelitis s/p amputation of toe - doing well post operatively. Keep appointment with surgeon on Friday. Did not unwrap dressing today as patient reports being told to leave it in place until his follow up appointment on Friday. Stressed with him that better control of diabetes is important to prevent recurrence.  2. Diabetes - encouraged follow up with endocrinology. Sounds like sugars are doing well now.  3. Thrush - nystatin four times a day for 1 week  FOLLOW UP: Follow up in 1-2 mos for routine follow up, sooner if needed  [name], [place]

## 2016-02-06 ENCOUNTER — Inpatient Hospital Stay: Payer: Commercial Managed Care - HMO | Admitting: Family Medicine

## 2016-02-08 ENCOUNTER — Other Ambulatory Visit: Payer: Self-pay | Admitting: Family Medicine

## 2016-02-13 ENCOUNTER — Ambulatory Visit: Payer: Commercial Managed Care - HMO | Admitting: Podiatry

## 2016-02-13 DIAGNOSIS — Z89421 Acquired absence of other right toe(s): Secondary | ICD-10-CM | POA: Diagnosis not present

## 2016-02-13 DIAGNOSIS — E1165 Type 2 diabetes mellitus with hyperglycemia: Secondary | ICD-10-CM | POA: Diagnosis not present

## 2016-02-13 DIAGNOSIS — I10 Essential (primary) hypertension: Secondary | ICD-10-CM | POA: Diagnosis not present

## 2016-02-13 DIAGNOSIS — E785 Hyperlipidemia, unspecified: Secondary | ICD-10-CM | POA: Diagnosis not present

## 2016-02-13 DIAGNOSIS — Z4789 Encounter for other orthopedic aftercare: Secondary | ICD-10-CM | POA: Diagnosis not present

## 2016-02-13 DIAGNOSIS — J449 Chronic obstructive pulmonary disease, unspecified: Secondary | ICD-10-CM | POA: Diagnosis not present

## 2016-02-14 ENCOUNTER — Ambulatory Visit: Payer: Commercial Managed Care - HMO | Admitting: Podiatry

## 2016-02-14 ENCOUNTER — Ambulatory Visit (INDEPENDENT_AMBULATORY_CARE_PROVIDER_SITE_OTHER): Payer: Commercial Managed Care - HMO | Admitting: Podiatry

## 2016-02-14 DIAGNOSIS — Z899 Acquired absence of limb, unspecified: Secondary | ICD-10-CM | POA: Diagnosis not present

## 2016-02-14 DIAGNOSIS — L84 Corns and callosities: Secondary | ICD-10-CM

## 2016-02-14 DIAGNOSIS — E1149 Type 2 diabetes mellitus with other diabetic neurological complication: Secondary | ICD-10-CM | POA: Diagnosis not present

## 2016-02-14 DIAGNOSIS — M2040 Other hammer toe(s) (acquired), unspecified foot: Secondary | ICD-10-CM

## 2016-02-14 NOTE — Patient Instructions (Signed)

## 2016-02-14 NOTE — Progress Notes (Addendum)
Patient ID: Adam Ray, male   DOB: 03/04/1956, 60 y.o.   MRN: [id number]  Patient presents for diabetic shoe pick up, shoes are tried on for good fit.  Patient received 1 pair [employer]pex [phone] Strap walker black in men's size 11 wide and 3 pairs custom molded diabetic inserts with 1st ray toe filler right.  Verbal and written break in and wear instructions given.  Patient will follow up for scheduled routine care. He had no new concerns today.

## 2016-02-15 DIAGNOSIS — Z89411 Acquired absence of right great toe: Secondary | ICD-10-CM | POA: Diagnosis not present

## 2016-02-15 DIAGNOSIS — M25561 Pain in right knee: Secondary | ICD-10-CM | POA: Diagnosis not present

## 2016-02-15 DIAGNOSIS — E1142 Type 2 diabetes mellitus with diabetic polyneuropathy: Secondary | ICD-10-CM | POA: Diagnosis not present

## 2016-02-16 ENCOUNTER — Encounter: Payer: Self-pay | Admitting: Internal Medicine

## 2016-02-19 DIAGNOSIS — M25561 Pain in right knee: Secondary | ICD-10-CM | POA: Diagnosis not present

## 2016-03-15 ENCOUNTER — Ambulatory Visit (INDEPENDENT_AMBULATORY_CARE_PROVIDER_SITE_OTHER): Payer: Commercial Managed Care - HMO | Admitting: *Deleted

## 2016-03-15 DIAGNOSIS — Z23 Encounter for immunization: Secondary | ICD-10-CM

## 2016-03-15 NOTE — Progress Notes (Signed)
   Adam Ray presents for immunizations.    Screening questions for immunizations: 1. Are you sick today?  no 2. Do you have allergies to medications, foods, or any vaccines?  no 3. Have you ever had a serious reaction after receiving a vaccination?  no 4. Do you have a long-term health problem with heart disease, asthma, lung disease, kidney disease, metabolic disease (e.g. diabetes), anemia, or other blood disorder?  no 5. Have you had a seizure, brain problem, or other nervous system problem?  no 6. Do you have cancer, leukemia, AIDS, or any other immune system problem?  no 7. Do you take cortisone, prednisone, other steroids, anticancer drugs or have you had radiation treatments?  no 8. Have you received a transfusion of blood or blood products, or been given immune (gamma) globulin or an antiviral drug in the past year?  no 9. Have you received vaccinations in the past 4 weeks?  no 10. FEMALES ONLY: Are you pregnant or is there a chance you could become pregnant during the next month?  no   Derl Barrow, RN

## 2016-03-18 ENCOUNTER — Telehealth: Payer: Self-pay | Admitting: Family Medicine

## 2016-03-18 NOTE — Telephone Encounter (Signed)
Patient came by office request 6 Norolog to be called in to [facility] on [address]. Please let patient know once done.

## 2016-03-20 MED ORDER — INSULIN ASPART 100 UNIT/ML ~~LOC~~ SOLN: 3.0000 [IU] | Freq: Three times a day (TID) | SUBCUTANEOUS | 2 refills | Status: DC

## 2016-03-20 NOTE — Telephone Encounter (Signed)
Pt informed. Laronica Bhagat, CMA  

## 2016-03-20 NOTE — Telephone Encounter (Signed)
rx sent in. Please inform patient, also that he needs to schedule follow up with his endocrinologist.   They are now managing his diabetes and should really be doing refills on insulin from here on out. He is past due for a follow up appointment with them.   [name], MD

## 2016-03-25 ENCOUNTER — Encounter: Payer: Self-pay | Admitting: Internal Medicine

## 2016-04-15 ENCOUNTER — Ambulatory Visit (INDEPENDENT_AMBULATORY_CARE_PROVIDER_SITE_OTHER): Payer: Commercial Managed Care - HMO | Admitting: Family Medicine

## 2016-04-15 ENCOUNTER — Telehealth: Payer: Self-pay | Admitting: *Deleted

## 2016-04-15 ENCOUNTER — Encounter: Payer: Self-pay | Admitting: Family Medicine

## 2016-04-15 VITALS — BP 118/70 | HR 77 | Temp 98.4°F | Ht 70.0 in | Wt 173.0 lb

## 2016-04-15 DIAGNOSIS — K0510 Chronic gingivitis, plaque induced: Secondary | ICD-10-CM

## 2016-04-15 DIAGNOSIS — N509 Disorder of male genital organs, unspecified: Secondary | ICD-10-CM | POA: Diagnosis not present

## 2016-04-15 DIAGNOSIS — Z125 Encounter for screening for malignant neoplasm of prostate: Secondary | ICD-10-CM | POA: Diagnosis not present

## 2016-04-15 DIAGNOSIS — N5089 Other specified disorders of the male genital organs: Secondary | ICD-10-CM

## 2016-04-15 DIAGNOSIS — B370 Candidal stomatitis: Secondary | ICD-10-CM

## 2016-04-15 MED ORDER — AMOXICILLIN 500 MG PO CAPS: 500.0000 mg | ORAL_CAPSULE | Freq: Three times a day (TID) | ORAL | 0 refills | Status: DC

## 2016-04-15 NOTE — Patient Instructions (Addendum)
Getting ultrasound to check out the testicular mass- this is probably a fluid collection that won't be a problem  Prostate blood test today  Amoxicillin for your gums HIV test today  Follow up later this week for scraping of your tongue test.  See me in 3 weeks  Be well, Dr. [name]

## 2016-04-15 NOTE — Telephone Encounter (Signed)
Patient called again about his results.  I told him that they were not back yet and that provider is already gone for the day since it is almost 5pm.  He was insistent that it was important for provider to call him today.  Will forward to Md to advise but I did tell him that she would call him once the results were back. Jazmin Hartsell,CMA

## 2016-04-15 NOTE — Progress Notes (Signed)
Date of Visit: 04/15/2016   HPI:  Patient presents to discuss a few acute issues:  - gum pain - located on R upper and lower gums for the last 4 days. Reports history of this in the past, which was treated with amoxicillin with good relief. No fevers. No drooling. Able to swallow normally. No teeth on that side of his mouth, does not wear dentures.  - white coating on tongue - has had for several months. Has taken nystatin liquid multiple times without much relief. Does not hurt, he's worried about what it is though and wants it gone.  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 Has had this evaluated before and was told it was fine, but it was years ago. Also reports decrease in volume of semen over time.  Other general updates: -recently patient followed up with his ophthalmologist. 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 They are working to save the vision in his left eye. -Patient also reports that he wants me to help write a letter saying that he got diabetes as a result of being exposed to [place] when he was in the [employer]. He was never in [place], but thinks he was exposed when the planes came back to [place]. I advised I cannot write this letter with what I currently know about his health and history. He is working with an endocrinologist at the [place] about this - will defer to them.  ROS: See HPI.  [place]: history of hypertension, COPD, bicuspid valve, diabetes, diabetic retinopathy, tobacco abuse, PVD  PHYSICAL EXAM: BP 118/70   Pulse 77   Temp 98.4 F (36.9 C) (Oral)   Ht 5\' 10"  (1.778 m)   Wt 173 lb (78.5 kg)   SpO2 98%   BMI 24.82 kg/m  Gen: NAD, pleasant, cooperative HEENT: normocephalic, atraumatic. Moist mucous membranes. Edentulous in most of mouth, though a few teeth remain. R upper and lower gums mildly tender to palpation, no sores or obvious lesions. Tongue with white  coating. GU: normal circumcised male penis. Scrotum normal in size. R testicle normal to palpation. L testicle with masslike separate component, though still attached, to L testicle. Neither are tender to palpation.   ASSESSMENT/PLAN:  Health maintenance:  -prostate cancer: Discussed risks and benefits of PSA testing. Pt elected to have PSA drawn today. Declined rectal digital prostate exam.   Thrush Unclear if this is truly thrush, since it has not responded to nystatin swish & swallow Wanted to perform KOH prep of tongue scraping today to confirm dx, but our lab technician who performs this is out today. Will have him return for lab visit later in the week to have tongue scraping KOH prep. Will rx amoxicillin for presumed gingivitis - follow up if not improving.  Testicular mass Previously worked up and was hydrocele, per records. No recent evaluation. Definitely feels mass-like to me today. Will check scrotal ultrasound to further characterize. May need urology referral at some point.  FOLLOW UP: Follow up in 3 weeks for gums & thrush Lab visit later this week for KOH prep  [name], [facility]

## 2016-04-15 NOTE — Telephone Encounter (Signed)
Patient called checking on his labs from today.  Informed patient that they are not back yet and that it could be tomorrow before they get resulted.  Told him that provider would call him when they come back.  He voiced understanding. Jazmin Hartsell,CMA

## 2016-04-16 ENCOUNTER — Telehealth: Payer: Self-pay | Admitting: Family Medicine

## 2016-04-16 LAB — HIV ANTIBODY (ROUTINE TESTING W REFLEX): HIV: NONREACTIVE

## 2016-04-16 LAB — PSA: PSA: 0.1 ng/mL (ref <=4.0)

## 2016-04-16 NOTE — Telephone Encounter (Signed)
Returned call to patient Advised of neg HIV and normal PSA Reminded him of his ultrasound appointment next week  Patient appreciative  [name], MD

## 2016-04-16 NOTE — Telephone Encounter (Signed)
2nd request. ep °

## 2016-04-16 NOTE — Telephone Encounter (Signed)
Pt is calling for his lab results. jw

## 2016-04-16 NOTE — Telephone Encounter (Signed)
Patient request a call from Dr [name] today if possible. Best # [phone].  Patient stated it is important.

## 2016-04-16 NOTE — Telephone Encounter (Signed)
Pt is calling back again and would like to speak to Dr. [name] about his test results and other issues. He is wanting a call today. jw

## 2016-04-17 NOTE — Telephone Encounter (Signed)
Returned call to patient. Notably I spoke with him about his labwork yesterday morning, prior to him calling three times yesterday PM (when I was off work).  He was calling because he wants me to speak with his wife [name] (who is also my patient) about his HIV test results. She wants to understand why the test was done. Makari gave me explicit permission to discuss this with her. Her # is [phone]. I called this number and there was no answer, no voicemail.  I then called Draegan back and advised that I couldn't reach her. He will have her call our clinic. When she calls, please get in touch with me via pager or by calling my office so that I can speak with her.  Thanks, [name], MD

## 2016-04-18 ENCOUNTER — Telehealth: Payer: Self-pay | Admitting: Family Medicine

## 2016-04-18 DIAGNOSIS — N5089 Other specified disorders of the male genital organs: Secondary | ICD-10-CM | POA: Insufficient documentation

## 2016-04-18 DIAGNOSIS — B370 Candidal stomatitis: Secondary | ICD-10-CM | POA: Insufficient documentation

## 2016-04-18 HISTORY — DX: Other specified disorders of the male genital organs: N50.89

## 2016-04-18 NOTE — Assessment & Plan Note (Signed)
Previously worked up and was hydrocele, per records. No recent evaluation. Definitely feels mass-like to me today. Will check scrotal ultrasound to further characterize. May need urology referral at some point.

## 2016-04-18 NOTE — Telephone Encounter (Signed)
Pt would like Dr. [name] to call today between 2pm-4pm. Please advise. Thanks! ep

## 2016-04-18 NOTE — Assessment & Plan Note (Signed)
Unclear if this is truly thrush, since it has not responded to nystatin swish & swallow Wanted to perform KOH prep of tongue scraping today to confirm dx, but our lab technician who performs this is out today. Will have him return for lab visit later in the week to have tongue scraping KOH prep. Will rx amoxicillin for presumed gingivitis - follow up if not improving.

## 2016-04-18 NOTE — Telephone Encounter (Signed)
Called patient. See separate phone note for details of his questions. With Safi's permission, I spoke with his wife and reassured her as to why the HIV test was done, and that it was negative. Lab visit scheduled for tomorrow afternoon for KOH prep of the mouth (since we couldn't get this done during his visit the other day).  [name], MD

## 2016-04-19 ENCOUNTER — Other Ambulatory Visit (INDEPENDENT_AMBULATORY_CARE_PROVIDER_SITE_OTHER): Payer: Commercial Managed Care - HMO

## 2016-04-19 ENCOUNTER — Encounter: Payer: Self-pay | Admitting: Family Medicine

## 2016-04-19 DIAGNOSIS — B370 Candidal stomatitis: Secondary | ICD-10-CM

## 2016-04-19 LAB — POCT ORAL KOH: KOH Prep POC: NEGATIVE

## 2016-04-22 ENCOUNTER — Encounter (INDEPENDENT_AMBULATORY_CARE_PROVIDER_SITE_OTHER): Payer: Self-pay | Admitting: Orthopedic Surgery

## 2016-04-22 ENCOUNTER — Ambulatory Visit (INDEPENDENT_AMBULATORY_CARE_PROVIDER_SITE_OTHER): Payer: Commercial Managed Care - HMO | Admitting: Orthopedic Surgery

## 2016-04-22 ENCOUNTER — Telehealth: Payer: Self-pay | Admitting: Family Medicine

## 2016-04-22 VITALS — Ht 71.0 in | Wt 170.0 lb

## 2016-04-22 DIAGNOSIS — Z89421 Acquired absence of other right toe(s): Secondary | ICD-10-CM | POA: Diagnosis not present

## 2016-04-22 NOTE — Telephone Encounter (Signed)
Labs mailed per patient request

## 2016-04-22 NOTE — Progress Notes (Signed)
Office Visit Note   Patient: Adam Ray           Date of Birth: [birth year]           MRN: [id number] Visit Date: 04/22/2016              Requested by: [name], MD [address] [place], [place] [address] PCP: Chrisandra Netters, MD   Assessment & Plan: Visit Diagnoses:  1. Status post amputation of toe of right foot ([place])     Plan: Follow-up in the office in 3 months. Recommended continued compression stockings recommended Band-Aids for protection of the calloused skin  Follow-Up Instructions: Return in about 3 months (around 07/23/2016).   Orders:  Orders Placed This Encounter  Procedures  . Debridement  . NAIL REMOVAL (AMB ORTHO)   No orders of the defined types were placed in this encounter.     Procedures: No procedures performed  After informed consent the wound was debrided of skin and soft tissue with a 10 blade knife back to bleeding viable granulation tissue. Wound measurements and photographs were obtained. Silver nitrate was used for hemostasis.  A dry dressing was applied. Patient tolerated the procedure without complications.  Patient lacks protective sensation and does not have the dexterity to reach their toes. After informed consent, thickened discolored onychomycotic nails were trimmed, less than 6 nails.  Silver nitrate was used for hemostasis, patient tolerated the procedure without complications.   Clinical Data: No additional findings.   Subjective: Chief Complaint  Patient presents with  . Right Foot - Routine Post Op, Follow-up  . Follow-up    Right foot 2nd toe amputation on 01/31/2016    Patient presents today for 4 week follow up visit for right foot 2nd toe amputation. He is applying antibacterial ointment dressing changes daily with bandaid. There is no drainage, no redness, there is swelling with pitting edema of right lower extremity. Patient applying nitrogylcerin patches daily. He is wearing an older pair of  compression stockings today but says 'they are tight'. He denies any pain.    Review of Systems   Objective: Vital Signs: Ht 5\' 11"  (1.803 m)   Wt 170 lb (77.1 kg)   BMI 23.71 kg/m   Physical Exam  Ortho Exam Patient is alert oriented no adenopathy well-dressed normal affect normal respiratory effort. Patient has an antalgic gait. Examination of both lower extremities he has venous stasis swelling with pitting edema there is no open ulcers there is no cellulitis. Examination the right foot there is hypertrophic callus and ulceration. Specialty Comments:  No specialty comments available.  Imaging: No results found.   PMFS History: Patient Active Problem List   Diagnosis Date Noted  . Testicular mass 04/18/2016  . Thrush 04/18/2016  . Soft tissue swelling   . Osteomyelitis ([place]) 01/29/2016  . Diabetic polyneuropathy associated with diabetes mellitus due to underlying condition ([place])   . Type 2 diabetes mellitus with neurologic complication, with long-term current use of insulin ([place]) 12/08/2015  . Left shoulder pain 08/10/2015  . Pain in joint, shoulder region 02/21/2015  . Pulmonary nodule 02/01/2015  . Screening for colon cancer 01/09/2015  . URI (upper respiratory infection) 04/29/2014  . Lumbar back pain 03/21/2014  . Rash and nonspecific skin eruption 12/22/2013  . Screening for STD (sexually transmitted disease) 12/15/2013  . Depression 12/09/2013  . Lightheadedness 11/19/2013  . Genital warts 08/20/2013  . 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 Right leg pain 04/27/2013  .  Diabetic retinopathy ([place]) 01/28/2013  . Visit for well man health check 11/19/2010  . Sleep apnea 09/06/2010  . 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 LEG EDEMA 03/03/2009  . Essential hypertension 11/09/2008  . 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 SICKLE-CELL TRAIT 04/26/2006  . ERECTILE DYSFUNCTION 04/26/2006  . Tobacco abuse 04/26/2006  . Peripheral vascular  disease ([place]) 04/26/2006  . ALLERGIC RHINITIS 04/26/2006   Past Medical History:  Diagnosis Date  . Allergy   . Arthritis   . Bronchitis   . Cataract   . Chronic kidney disease   . Claudication [facility])    right foot ray resection  . Colon polyps    hyperplastic  . COPD (chronic obstructive pulmonary disease) ([place])   . Diabetes mellitus   . Genital warts   . Gout     Family History  Problem Relation Age of Onset  . Diabetes Mother   . Stroke Mother   . Heart failure Father     Past Surgical History:  Procedure Laterality Date  . AMPUTATION Right 01/31/2016   Procedure: Right 2nd Toe Amputation;  Surgeon: Newt Minion, MD;  Location: [place] [facility];  Service: Orthopedics;  Laterality: Right;  . I&D EXTREMITY  04/11/2012   Procedure: IRRIGATION AND DEBRIDEMENT EXTREMITY;  Surgeon: Wylene Simmer, MD;  Location: [facility];  Service: Orthopedics;  Laterality: Right;  . Surgery left great toe    . Tear ducts bilateral eyes     Social History   Occupational History  . disabled    Social History Main Topics  . Smoking status: Former Smoker    Packs/day: 0.30    Years: 48.00    Types: Cigars    Start date: 07/02/1963  . Smokeless tobacco: Former Systems developer     Comment: wants to use electric cigarettes.  not interested in pills, worried about chantix side effects  . Alcohol use 0.0 oz/week  . Drug use: No  . Sexual activity: Not on file

## 2016-04-22 NOTE — Telephone Encounter (Signed)
Would like his results mailed to him

## 2016-04-22 NOTE — Procedures (Signed)
After informed consent the wound was debrided of skin and soft tissue with a 10 blade knife back to bleeding viable granulation tissue. Wound measurements and photographs were obtained. Silver nitrate was used for hemostasis.  A dry dressing was applied. Patient tolerated the procedure without complications.

## 2016-04-23 ENCOUNTER — Telehealth: Payer: Self-pay | Admitting: Family Medicine

## 2016-04-23 ENCOUNTER — Other Ambulatory Visit: Payer: Commercial Managed Care - HMO

## 2016-04-23 NOTE — Telephone Encounter (Signed)
Pt would like to know where he is supposed to go to see a tongue specialist. Pt would also like for Dr. [name] to call him. Please advise. Thanks! ep

## 2016-04-24 NOTE — Progress Notes (Signed)
Late entry Patient came in for lab visit on 10/20 to have KOH prep done. I obtained KOH sample from tongue. This was performed in the lab and was negative for yeast. Patient still very worried about the white coating on his tongue. Offered ENT referral. This was scheduled for patient and he was given appointment information.  [name], MD

## 2016-04-25 NOTE — Telephone Encounter (Signed)
Returned call to patient. I asked him if he still has the appointment card that he was given during his lab visit last week. He does have it. Advised that appointment information is on that card. He had no further questions.  [name], MD

## 2016-04-29 ENCOUNTER — Telehealth: Payer: Self-pay | Admitting: Family Medicine

## 2016-04-29 NOTE — Telephone Encounter (Signed)
Pt is calling for a refill on his cream that he uses for his warts. jw

## 2016-04-29 NOTE — Telephone Encounter (Signed)
3rd request. ep °

## 2016-04-29 NOTE — Telephone Encounter (Signed)
Pt is calling to check the status of his request for a cream that he uses for his warts. jw

## 2016-05-01 ENCOUNTER — Ambulatory Visit (INDEPENDENT_AMBULATORY_CARE_PROVIDER_SITE_OTHER): Payer: Commercial Managed Care - HMO | Admitting: Family Medicine

## 2016-05-01 VITALS — BP 132/77 | HR 73 | Temp 98.2°F | Wt 177.0 lb

## 2016-05-01 DIAGNOSIS — A630 Anogenital (venereal) warts: Secondary | ICD-10-CM

## 2016-05-01 MED ORDER — IMIQUIMOD 5 % EX CREA: TOPICAL_CREAM | CUTANEOUS | 1 refills | Status: DC

## 2016-05-01 NOTE — Patient Instructions (Signed)
Nice to meet you. Apply cream to affected area 3 times weekly at bedtime. Wash off in the morning with soap and water. Continue to apply until is completely resolved. If it is still there in 8 weeks, make a follow-up appointment to see [place] again.  Take care, Dr. B   Genital Warts Genital warts are small growths in the genital area or anal area. They are caused by a type of germ (HPV virus). This germ is spread from person to person during sex. It can be spread through vaginal, anal, and oral sex. Genital warts can lead to other problems if they are not treated. HOME CARE Medicines  Apply over-the-counter and prescription medicines only as told by your doctor.  Do not use medicines that are meant for treating hand warts.  Talk with your doctor about using anti-itch creams. General Instructions  Do not touch or scratch the warts.  Do not have sex until your treatment is done.  Tell your current and past sexual partners about your condition. They may need treatment.  Keep all follow-up visits as told by your doctor. This is important.  After treatment, use condoms during sex. Other Instructions for Women  Women who have genital warts may need to be checked more often for cervical cancer.  If you become pregnant, tell your doctor that you have had genital warts. The germ can be passed to the baby. GET HELP IF:  You have redness, swelling, or pain in the area of the treated skin.  You have a fever.  You feel generally sick.  You feel lumps in and around your genital area or anal area.  You have bleeding in your genital area or anal area.  You have pain during sex.   This information is not intended to replace advice given to you by your health care provider. Make sure you discuss any questions you have with your health care provider.   Document Released: 09/11/2009 Document Revised: 03/08/2015 Document Reviewed: 09/12/2014 Elsevier Interactive Patient Education [employer]

## 2016-05-01 NOTE — Progress Notes (Signed)
   Subjective:   Adam Ray is a 60 y.o. male with a history of Genital warts here for same day appointment for possible recurrence of genital warts  Patient reports he was diagnosed about 3 years ago which genital warts. His wife told him that she saw some come back that a week ago. He tried one pack of imiquimod that he had left a few nights ago. He denies any penile discharge or fever. He has had no new recent sexual partners. He uses a condom when sexually active with his wife  Review of Systems:  Per HPI.   Social History: Former smoker  Objective:  BP 132/77   Pulse 73   Temp 98.2 F (36.8 C) (Oral)   Wt 177 lb (80.3 kg)   BMI 24.69 kg/m   Gen:  61 y.o. male in NAD HEENT: NCAT, MMM, anicteric sclerae CV: RRR, no MRG Resp: Non-labored, CTAB, no wheezes noted Male genitalia: not done Penis: circumcised and warts Scrotum: normal MSK: No obvious deformities, gait intact Neuro: Alert and oriented, speech normal    Assessment & Plan:     Adam Ray is a 60 y.o. male here for   Genital warts Exam consistent of recurrence of genital warts Treat with imiquimod 5% 3 times weekly bedtime Discussed safe sex practices with patient and possibility for recurrence If no better after 8 weeks of treatment needs follow-up appointment      Virginia Crews, MD MPH PGY-3,  [facility] 05/01/2016  11:05 AM

## 2016-05-01 NOTE — Assessment & Plan Note (Signed)
Exam consistent of recurrence of genital warts Treat with imiquimod 5% 3 times weekly bedtime Discussed safe sex practices with patient and possibility for recurrence If no better after 8 weeks of treatment needs follow-up appointment

## 2016-05-01 NOTE — Telephone Encounter (Signed)
Returned call to patient. He reports his wife [name] found some warts on his scrotum. Thinks they are similar to the ones he had before. Is agreeable to coming in to be seen this morning to confirm diagnosis, since it's been >1 year since we treated him for these and I just did a scrotal exam several weeks ago. Appointment scheduled at 10:45a with Dr. Brita Romp. I will give her a verbal heads up that he is coming as well.  [name], MD

## 2016-05-06 ENCOUNTER — Telehealth: Payer: Self-pay | Admitting: *Deleted

## 2016-05-06 NOTE — Telephone Encounter (Signed)
Pt no show PV today. Called mobile number first, "not accepting calls at this time". Then called home # listed, no answer, left message for patient to call us back to reschedule nurse visit.

## 2016-05-08 ENCOUNTER — Encounter: Payer: Self-pay | Admitting: Internal Medicine

## 2016-05-08 ENCOUNTER — Telehealth: Payer: Self-pay | Admitting: Family Medicine

## 2016-05-08 NOTE — Telephone Encounter (Signed)
Appointment re-scheduled, pre-op 12/28 at 10am and procedure 1/11 at 10am. Patient informed and given number in case he needs to cancel re-schedule [phone].

## 2016-05-08 NOTE — Telephone Encounter (Signed)
Needs a new referral to the colonoscopy dr because  He missed his appt on Monday Nov 6

## 2016-05-10 ENCOUNTER — Ambulatory Visit (INDEPENDENT_AMBULATORY_CARE_PROVIDER_SITE_OTHER): Payer: Commercial Managed Care - HMO | Admitting: Family Medicine

## 2016-05-10 ENCOUNTER — Encounter: Payer: Self-pay | Admitting: Family Medicine

## 2016-05-10 VITALS — BP 111/68 | HR 69 | Temp 98.0°F | Ht 71.0 in | Wt 174.6 lb

## 2016-05-10 DIAGNOSIS — E1140 Type 2 diabetes mellitus with diabetic neuropathy, unspecified: Secondary | ICD-10-CM

## 2016-05-10 DIAGNOSIS — A630 Anogenital (venereal) warts: Secondary | ICD-10-CM

## 2016-05-10 DIAGNOSIS — Z794 Long term (current) use of insulin: Secondary | ICD-10-CM | POA: Diagnosis not present

## 2016-05-10 LAB — POCT GLYCOSYLATED HEMOGLOBIN (HGB A1C): Hemoglobin A1C: 8

## 2016-05-10 MED ORDER — PODOFILOX 0.5 % EX SOLN: Freq: Two times a day (BID) | CUTANEOUS | 1 refills | Status: DC

## 2016-05-10 NOTE — Progress Notes (Signed)
   HPI  CC: Genital warts. Patient is here with complaints of genital warts. He was seen for this 9-10 days ago and prescribed Aldara cream. he endorses good compliance with this medication but states it is not working, and he would like any medication. He endorses no other reason for today's visit.  Of note: Patient and wife were abrasive throughout the entire visit today. A very loud arguments seem to persist throughout the entire visit about whether or not his genital warts were evidence of him "messing around". Much of today's visit was spent trying to answer numerous and frequently repeated questions.  ROS: No fever, chills, nausea, vomiting, diarrhea, dysuria.  CC, SH/smoking status, and VS noted  Objective: BP 111/68 (BP Location: Left Arm, Patient Position: Sitting, Cuff Size: Normal)   Pulse 69   Temp 98 F (36.7 C) (Oral)   Ht 5\' 11"  (1.803 m)   Wt 174 lb 9.6 oz (79.2 kg)   SpO2 98%   BMI 24.35 kg/m  Gen:  60 y.o. male in NAD CV:  well-perfused Resp: Non-labored Male genitalia: circumcised and warts Scrotum: normal Neuro: Alert and oriented, speech normal [Adam Ray present for exam]  Assessment and plan:  Genital warts Stable: Patient is here to obtain a new prescription for his genital warts. He endorses good compliance with the aldara cream but states that it's not doing anything. I tried to inform him that this cream would take more than 10 days to properly treat his genital warts. Patient insisted on obtaining a new prescription. - Podofilox prescribed. Informed him to apply twice a day for 3 days and take a 4 day vacation off of this medication. After that vacation he can repeat the same treatment course until the warts have resolved.  Of note: Patient and wife were very abrasive throughout the entire visit. I spent significant amount of time answering frequently repeated questions. I believe there is a significant amount of cognitive limitations on both sides,  patient and wife, which in my opinion may go beyond simple educational limitations.   Orders Placed This Encounter  Procedures  . HgB A1c    Meds ordered this encounter  Medications  . podofilox (CONDYLOX) 0.5 % external solution    Sig: Apply topically 2 (two) times daily. For 3 days. Then withhold for 4 days. Then repeat.    Dispense:  3.5 mL    Refill:  1     Elberta Leatherwood, MD,MS,  PGY3 05/10/2016 3:12 PM

## 2016-05-10 NOTE — Patient Instructions (Addendum)
It was a pleasure seeing you today in our clinic. Today we discussed his genital warts. Here is the treatment plan we have discussed and agreed upon together:   - I have changed your medication at this time. Do not use the topical cream prescribed to you at the last visit. The new cream, Podofilox, should be applied twice a day for 3 days straight. Then take the following 4 days off. Repeat this schedule the warts have resolved. - Please follow up with your PCP

## 2016-05-10 NOTE — Assessment & Plan Note (Signed)
Stable: Patient is here to obtain a new prescription for his genital warts. He endorses good compliance with the aldara cream but states that it's not doing anything. I tried to inform him that this cream would take more than 10 days to properly treat his genital warts. Patient insisted on obtaining a new prescription. - Podofilox prescribed. Informed him to apply twice a day for 3 days and take a 4 day vacation off of this medication. After that vacation he can repeat the same treatment course until the warts have resolved.  Of note: Patient and wife were very abrasive throughout the entire visit. I spent significant amount of time answering frequently repeated questions. I believe there is a significant amount of cognitive limitations on both sides, patient and wife, which in my opinion may go beyond simple educational limitations.

## 2016-05-13 ENCOUNTER — Telehealth: Payer: Self-pay | Admitting: Family Medicine

## 2016-05-13 ENCOUNTER — Telehealth (INDEPENDENT_AMBULATORY_CARE_PROVIDER_SITE_OTHER): Payer: Self-pay

## 2016-05-13 NOTE — Telephone Encounter (Signed)
[facility] fax for 90 day refill nitro patch 0.2mg /hr

## 2016-05-13 NOTE — Telephone Encounter (Signed)
Ok refill? 

## 2016-05-13 NOTE — Telephone Encounter (Signed)
No answer. Left a message to call back for free diabetic eye exam 06/07/16.

## 2016-05-17 ENCOUNTER — Other Ambulatory Visit (INDEPENDENT_AMBULATORY_CARE_PROVIDER_SITE_OTHER): Payer: Self-pay

## 2016-05-17 DIAGNOSIS — M79604 Pain in right leg: Secondary | ICD-10-CM

## 2016-05-17 MED ORDER — NITROGLYCERIN 0.2 MG/HR TD PT24: 0.2000 mg | MEDICATED_PATCH | Freq: Every day | TRANSDERMAL | 12 refills | Status: DC

## 2016-05-17 NOTE — Telephone Encounter (Signed)
rx entered into module.

## 2016-05-20 ENCOUNTER — Encounter: Payer: Commercial Managed Care - HMO | Admitting: Internal Medicine

## 2016-05-21 ENCOUNTER — Ambulatory Visit (INDEPENDENT_AMBULATORY_CARE_PROVIDER_SITE_OTHER): Payer: Commercial Managed Care - HMO | Admitting: Family Medicine

## 2016-05-21 ENCOUNTER — Encounter: Payer: Self-pay | Admitting: Family Medicine

## 2016-05-21 ENCOUNTER — Ambulatory Visit: Payer: Commercial Managed Care - HMO | Admitting: Family Medicine

## 2016-05-21 VITALS — BP 125/75 | HR 85 | Temp 98.4°F | Ht 71.0 in | Wt 170.8 lb

## 2016-05-21 DIAGNOSIS — A630 Anogenital (venereal) warts: Secondary | ICD-10-CM

## 2016-05-21 LAB — CBC
HEMATOCRIT: 40.5 % (ref 38.5–50.0)
Hemoglobin: 13.4 g/dL (ref 13.2–17.1)
MCH: 29.3 pg (ref 27.0–33.0)
MCHC: 33.1 g/dL (ref 32.0–36.0)
MCV: 88.6 fL (ref 80.0–100.0)
MPV: 10.9 fL (ref 7.5–12.5)
PLATELETS: 108 10*3/uL — AB (ref 140–400)
RBC: 4.57 MIL/uL (ref 4.20–5.80)
RDW: 14.8 % (ref 11.0–15.0)
WBC: 3.5 10*3/uL — AB (ref 3.8–10.8)

## 2016-05-21 MED ORDER — IMIQUIMOD 5 % EX CREA: TOPICAL_CREAM | CUTANEOUS | 1 refills | Status: DC

## 2016-05-21 NOTE — Progress Notes (Signed)
Subjective:   Patient ID: Adam Ray    DOB: [birth year], 60 y.o. male   MRN: [id number]  CC: "Genital warts"  HPI: Adam Ray is a 61 y.o. male who presents to clinic today for genital warts. Problems discussed today are as follows:  Genital Warts: H/o genital warts 3 years ago which resolved over 2 months with tx. Patient unsure what he was given at that time. He has experienced this episode for the past 2 months. Was seen in clinic on 05/01/16 and given Imiquimod. Returned 9 days later complaining of no response to med. Given Podfiolox but returns today for same reason. Demanding "strong medication" and requesting pain medication. Says he has 10/10 sharp pain at site and has some drainage. Denies fevers, dyspnea, nausea, vomiting, penile discharge, dysuria.  ROS: Complete ROS performed, see HPI for pertinent ROS.  [place]: Pertinent past medical, surgical, family, and social history were reviewed and updated as appropriate. Smoking status reviewed.  Medications reviewed. Current Outpatient Prescriptions  Medication Sig Dispense Refill  . ACCU-CHEK FASTCLIX LANCETS MISC     . ACCU-CHEK SOFTCLIX LANCETS lancets Use as instructed 100 each 12  . acetaminophen (TYLENOL) 500 MG tablet Take 2 tablets (1,000 mg total) by mouth every 8 (eight) hours as needed. 60 tablet 0  . albuterol (PROVENTIL HFA;VENTOLIN HFA) 108 (90 Base) MCG/ACT inhaler Inhale 2 puffs into the lungs every 6 (six) hours as needed for wheezing or shortness of breath. 1 Inhaler 0  . Alcohol Swabs (B-D SINGLE USE SWABS REGULAR) PADS     . amoxicillin (AMOXIL) 500 MG capsule Take 1 capsule (500 mg total) by mouth 3 (three) times daily. 21 capsule 0  . aspirin EC 81 MG tablet Take 81 mg by mouth daily.    . BD INSULIN SYRINGE ULTRAFINE 31G X 5/16" 0.3 ML MISC     . Blood Glucose Calibration (ACCU-CHEK SMARTVIEW CONTROL) LIQD     . Blood Glucose Monitoring Suppl (ACCU-CHEK AVIVA) device Use as instructed to check blood sugar 3  times per day 1 each 0  . glucose blood (ACCU-CHEK AVIVA) test strip Use as instructed 100 each 12  . insulin aspart (NOVOLOG) 100 UNIT/ML injection Inject 3 Units into the skin 3 (three) times daily before meals. 30 mL 2  . insulin glargine (LANTUS) 100 UNIT/ML injection Inject 0.4 mLs (40 Units total) into the skin at bedtime. 30 mL 11  . nitroGLYCERIN (NITRO-DUR) 0.2 mg/hr patch Place 1 patch (0.2 mg total) onto the skin daily. 30 patch 12  . oxyCODONE (OXY IR/ROXICODONE) 5 MG immediate release tablet Take 1 tablet (5 mg total) by mouth every 6 (six) hours as needed for breakthrough pain. 20 tablet 0  . quinapril (ACCUPRIL) 5 MG tablet Take 0.5 tablets (2.5 mg total) by mouth daily. 30 tablet 11  . rosuvastatin (CRESTOR) 20 MG tablet Take 20 mg by mouth daily.    Derrill Memo ON 05/22/2016] imiquimod (ALDARA) 5 % cream Apply topically 3 (three) times a week. 12 each 1   No current facility-administered medications for this visit.     Objective:   BP 125/75   Pulse 85   Temp 98.4 F (36.9 C) (Oral)   Ht 5\' 11"  (1.803 m)   Wt 170 lb 12.8 oz (77.5 kg)   BMI 23.82 kg/m  Vitals and nursing note reviewed.  General: well nourished, well developed, in no acute distress with non-toxic appearance HEENT: normocephalic, atraumatic, moist mucous membranes CV: regular rate and rhythm  without murmurs, rubs, or gallops, no lower extremity edema Lungs: clear to auscultation bilaterally with normal work of breathing Abdomen: soft, non-tender, non-distended, normoactive bowel sounds Skin: warm, dry, cap refill < 2 seconds GU: induration of scrotal tissue w/o erythema with serous drainage and foul odor, spares penile shaft and no penile discharge Extremities: warm and well perfused, normal tone  Assessment & Plan:   Warts, genital Onset 3 weeks ago. Given Imiquimod 5% cream on 05/01/16 during initial visit. Inadequate duration of medication. Pt returned and given Podofilox w/o response on 05/09/16.  Obvious warts with mild soft tissue breakdown with foul smell. Clear drainage. Does not look infected. Will continue Imiquimod given inadequate tx. --Imiquimod 5% cream given, discussed instructions and proper duration for response --Stop Podofilox while on Imiquimod  --Educated on safe sex practices --CBC pending given pt and wife concern for infection --Dry dressings to site of concern --Discussed need for diabetes f/u and smoking cessation to promote better healing  Orders Placed This Encounter  Procedures  . CBC   Meds ordered this encounter  Medications  . imiquimod (ALDARA) 5 % cream    Sig: Apply topically 3 (three) times a week.    Dispense:  12 each    Refill:  [place], [place], PGY-1 05/21/2016 3:58 PM

## 2016-05-21 NOTE — Patient Instructions (Addendum)
It was a pleasure to meet you today. Please see below to review our plan for today's visit.  1. Apply Imiquimod cream to area prior to bedtime; leave on skin for 6 to 10 hours, then remove with mild soap and water. Continue until there is total clearance of the genital/perianal warts or for a maximum duration of therapy of 16 weeks (4 months). I have sent a refill to you pharmacy. 2. This will not clear up until you take the medication for a few months. 3. STOP the Podofilox. You do not need to take this with the other medication. 4. Keep area clear and dry before applying bandages. 5. If you have a fever of 100.75F or more, get chills, area becomes more swollen or discharge becomes worse, seek medical attention. 6. Follow up in 3 months. 7. Use safe sex practices like condoms.  Please call the clinic at [phone] if your symptoms worsen or you have any concerns. It was my pleasure to see you. -- Harriet Butte, [place], PGY-1  Imiquimod skin cream What is this medicine? IMIQUIMOD (i mi KWI mod) cream is used to treat external genital or anal warts. It is also used to treat other skin conditions such as actinic keratosis and certain types of skin cancer. This medicine may be used for other purposes; ask your health care provider or pharmacist if you have questions. COMMON BRAND NAME(S): Tawni Levy What should I tell my health care provider before I take this medicine? They need to know if you have any of these conditions: -decreased immune function -an unusual or allergic reaction to imiquimod, other medicines, foods, dyes, or preservatives -pregnant or trying to get pregnant -breast-feeding How should I use this medicine? This medicine is for external use only. Do not take by mouth. Follow the directions on the prescription label. Apply just before bedtime. Wash your hands before and after use. Apply a thin layer of cream and massage gently into the  affected areas until no longer visible. Do not use in the mouth, eyes or the vagina. Use this medicine only on the affected area as directed by your health care provider. Do not use for longer than prescribed. It is important not to use more medicine than prescribed. To do so may increase the chance of side effects. Talk to your pediatrician regarding the use of this medicine in children. While this drug may be prescribed for children as young as 46 years of age for selected conditions, precautions do apply. Overdosage: If you think you have taken too much of this medicine contact a poison control center or emergency room at once. NOTE: This medicine is only for you. Do not share this medicine with others. What if I miss a dose? If you miss a dose, use it as soon as you can. If it is almost time for your next dose, use only that dose. Do not use double or extra doses. What may interact with this medicine? Interactions are not expected. Do not use any other medicines on the treated area without asking your doctor or health care professional. This list may not describe all possible interactions. Give your health care provider a list of all the medicines, herbs, non-prescription drugs, or dietary supplements you use. Also tell them if you smoke, drink alcohol, or use illegal drugs. Some items may interact with your medicine. What should I watch for while using this medicine? Visit your health care professional for regular checks on your  progress. Do not use this medicine until the skin has healed from any other drug (example: podofilox or podophyllin resin) or surgical skin treatment. Females should receive regular pelvic exams while being treated for genital warts. Most patients see improvement within 4 weeks. It may take up to 16 weeks to see a full clearing of the warts. This medicine is not a cure. New warts may develop during or after treatment. Avoid sexual (genital, anal, oral) contact while the  cream is on the skin. If warts are visible in the genital area, sexual contact should be avoided until the warts are treated. The use of latex condoms during sexual contact may reduce, but not entirely prevent, infecting others. This medicine may weaken condoms, diaphragms, cervical caps or other barrier devices and make them less effective as birth control. Do not cover the treated area with an airtight bandage. Cotton gauze dressings can be used. Cotton underwear can be worn after using this medicine on the genital or anal area. Actinic keratoses that were not seen before may appear during treatment and may later go away. The treatment area and surrounding area may lighten or darken after treatment with this medicine. These skin color changes may be permanent in some patients. If you experience a skin reaction at the treatment site that interferes or prevents you from doing any daily activity, contact your health care provider. You may need a rest period from treatment. Treatment may be restarted once the reaction has gotten better as recommended by your doctor or health care professional. This medicine can make you more sensitive to the sun. Keep out of the sun. If you cannot avoid being in the sun, wear protective clothing and use sunscreen. Do not use sun lamps or tanning beds/booths. What side effects may I notice from receiving this medicine? Side effects that you should report to your doctor or health care professional as soon as possible: -open sores with or without drainage -skin infection -skin rash -unusual or severe skin reaction Side effects that usually do not require medical attention (report to your doctor or health care professional if they continue or are bothersome): -burning or itching -redness of the skin (very common but is usually not painful or harmful) -scabbing, crusting, or peeling skin -skin that becomes hard or thickened -swelling of the skin This list may not describe  all possible side effects. Call your doctor for medical advice about side effects. You may report side effects to FDA at 1-800-FDA-1088. Where should I keep my medicine? Keep out of the reach of children. Store between 4 and 25 degrees C (39 and 77 degrees F). Do not freeze. Throw away any unused medicine after the expiration date. Discard packet after applying to affected area. Partial packets should not be saved or reused. NOTE: This sheet is a summary. It may not cover all possible information. If you have questions about this medicine, talk to your doctor, pharmacist, or health care provider.  2017 Elsevier/Gold Standard (2008-05-31 10:33:25)

## 2016-05-21 NOTE — Assessment & Plan Note (Addendum)
Onset 3 weeks ago. Given Imiquimod 5% cream on 05/01/16 during initial visit. Inadequate duration of medication. Pt returned and given Podofilox w/o response on 05/09/16. Obvious warts with mild soft tissue breakdown with foul smell. Clear drainage. Does not look infected. Will continue Imiquimod given inadequate tx. --Imiquimod 5% cream given, discussed instructions and proper duration for response --Stop Podofilox while on Imiquimod  --Educated on safe sex practices --CBC pending given pt and wife concern for infection --Dry dressings to site of concern --Discussed need for diabetes f/u and smoking cessation to promote better healing

## 2016-05-22 ENCOUNTER — Encounter: Payer: Self-pay | Admitting: Family Medicine

## 2016-06-11 ENCOUNTER — Other Ambulatory Visit: Payer: Self-pay | Admitting: Family Medicine

## 2016-06-13 ENCOUNTER — Ambulatory Visit (INDEPENDENT_AMBULATORY_CARE_PROVIDER_SITE_OTHER): Payer: Commercial Managed Care - HMO | Admitting: Obstetrics and Gynecology

## 2016-06-13 ENCOUNTER — Ambulatory Visit (INDEPENDENT_AMBULATORY_CARE_PROVIDER_SITE_OTHER): Payer: Commercial Managed Care - HMO | Admitting: *Deleted

## 2016-06-13 ENCOUNTER — Encounter: Payer: Self-pay | Admitting: *Deleted

## 2016-06-13 ENCOUNTER — Encounter: Payer: Self-pay | Admitting: Obstetrics and Gynecology

## 2016-06-13 VITALS — Ht 71.0 in | Wt 174.6 lb

## 2016-06-13 VITALS — BP 106/62 | HR 70 | Temp 97.8°F | Wt 174.0 lb

## 2016-06-13 DIAGNOSIS — E1121 Type 2 diabetes mellitus with diabetic nephropathy: Secondary | ICD-10-CM

## 2016-06-13 DIAGNOSIS — Z794 Long term (current) use of insulin: Secondary | ICD-10-CM | POA: Diagnosis not present

## 2016-06-13 DIAGNOSIS — M545 Low back pain, unspecified: Secondary | ICD-10-CM

## 2016-06-13 DIAGNOSIS — E1140 Type 2 diabetes mellitus with diabetic neuropathy, unspecified: Secondary | ICD-10-CM | POA: Diagnosis not present

## 2016-06-13 DIAGNOSIS — IMO0002 Reserved for concepts with insufficient information to code with codable children: Secondary | ICD-10-CM

## 2016-06-13 DIAGNOSIS — E1165 Type 2 diabetes mellitus with hyperglycemia: Secondary | ICD-10-CM

## 2016-06-13 MED ORDER — BACLOFEN 10 MG PO TABS: 10.0000 mg | ORAL_TABLET | Freq: Three times a day (TID) | ORAL | 0 refills | Status: DC | PRN

## 2016-06-13 MED ORDER — INSULIN ASPART 100 UNIT/ML ~~LOC~~ SOLN: 25.0000 [IU] | Freq: Four times a day (QID) | SUBCUTANEOUS | Status: DC

## 2016-06-13 MED ORDER — IBUPROFEN 600 MG PO TABS: 600.0000 mg | ORAL_TABLET | Freq: Four times a day (QID) | ORAL | 0 refills | Status: DC | PRN

## 2016-06-13 NOTE — Patient Instructions (Addendum)
Back Pain, Adult Introduction Back pain is very common. The pain often gets better over time. The cause of back pain is usually not dangerous. Most people can learn to manage their back pain on their own. Follow these instructions at home: Watch your back pain for any changes. The following actions may help to lessen any pain you are feeling:  Stay active. Start with short walks on flat ground if you can. Try to walk farther each day.  Exercise regularly as told by your doctor. Exercise helps your back heal faster. It also helps avoid future injury by keeping your muscles strong and flexible.  Do not sit, drive, or stand in one place for more than 30 minutes.  Do not stay in bed. Resting more than 1-2 days can slow down your recovery.  Be careful when you bend or lift an object. Use good form when lifting:  Bend at your knees.  Keep the object close to your body.  Do not twist.  Sleep on a firm mattress. Lie on your side, and bend your knees. If you lie on your back, put a pillow under your knees.  Take medicines only as told by your doctor.  Put ice on the injured area.  Put ice in a plastic bag.  Place a towel between your skin and the bag.  Leave the ice on for 20 minutes, 2-3 times a day for the first 2-3 days. After that, you can switch between ice and heat packs.  Avoid feeling anxious or stressed. Find good ways to deal with stress, such as exercise.  Maintain a healthy weight. Extra weight puts stress on your back. Contact a doctor if:  You have pain that does not go away with rest or medicine.  You have worsening pain that goes down into your legs or buttocks.  You have pain that does not get better in one week.  You have pain at night.  You lose weight.  You have a fever or chills. Get help right away if:  You cannot control when you poop (bowel movement) or pee (urinate).  Your arms or legs feel weak.  Your arms or legs lose feeling  (numbness).  You feel sick to your stomach (nauseous) or throw up (vomit).  You have belly (abdominal) pain.  You feel like you may pass out (faint). This information is not intended to replace advice given to you by your health care provider. Make sure you discuss any questions you have with your health care provider. Document Released: 12/04/2007 Document Revised: 11/23/2015 Document Reviewed: 10/19/2013  2017 Elsevier     Decrease Novolog to 25 units prior to meals 3-4 times daily.  Next visti with Dr. [name] in early January.  Bring all medications to that visit.

## 2016-06-13 NOTE — Progress Notes (Signed)
   Subjective:   Patient ID: Adam Ray, male    DOB: 28-Jun-1956, 60 y.o.   MRN: [id number]  Patient presents for Same Day Appointment with complaints of left back pain.   Chief Complaint  Patient presents with  . Pain    left sided pain     HPI: # BACK PAIN  Patient reports his left-sided back pain that began 3-4 days ago. Believes he might have pulled a muscle that might be causing his pain. Denies any recent or previous history of trauma to the area. Reports using ibuprofen as needed which has helped some. No radiation of pain down his buttocks, thighs, or leg. Denies any weakness, numbness or tingling in any of his extremities. Denies bowel or bladder incontinence, fever, chills, fatigue or weight loss. Just hurting at night mainly. Hard for him to bend and pick things up when it hurts. No pain currently. Pain is described as sharp  Prior history of similar pain: no  Review of Systems   See HPI for ROS.   History  Smoking Status  . Former Smoker  . Packs/day: 0.30  . Years: 48.00  . Types: Cigars  . Start date: 07/02/1963  Smokeless Tobacco  . Former Systems developer    Comment: wants to use electric cigarettes.  not interested in pills, worried about chantix side effects    Past medical history, surgical, family, and social history reviewed and updated in the EMR as appropriate.  Objective:  BP 106/62   Pulse 70   Temp 97.8 F (36.6 C) (Oral)   Wt 174 lb (78.9 kg)   SpO2 97%   BMI 24.27 kg/m  Vitals and nursing note reviewed  Physical Exam  GEN: Pleasant male. NAD.  NEURO: Alert and interacting appropriately.   Back Exam:  Inspection: Unremarkable  Motion: Flexion 45 deg, Extension 45 deg, Side Bending to 45 deg bilaterally,  Rotation to 45 deg bilaterally  SLR laying: Negative  Palpable tenderness: None. FABER: negative. Sensory change: Gross sensation intact to all lumbar and sacral dermatomes.  Strength intact 5/5   Assessment & Plan:  1. Acute left-sided low  back pain without sciatica Patient reported left-sided back pain that started a few days ago. No associated neurologic symptoms. Normal back and neuro exam. No red flags. Currently pain free. Most likely MSK pain. Patient given extra strength ibuprofen to use prn and baclofen for muscle spasms. Encouraged activity to help with pain and other conservative measures such as heat. Warning signs and return precautions discussed. Follow-up with PCP as needed.    Luiz Blare, DO 06/13/2016, 1:57 PM PGY-3, [place]

## 2016-06-13 NOTE — Progress Notes (Signed)
   Stopped appt at med rec. Patient had 8 bottles of meds that he brought with him from [place] that were not on med list. Also had wife's tramadol that he states he takes as well as glipizide from 2006.  Patient reports "a bunch" of BS < 70 over last 2 weeks, one as low as 30, with symptoms of warmth, shakiness and vision "going black."  Patient states he ate hard candy or drank OJ to raise BS. Taking 30 units of novolog TID with meals. Med list states 3 units. Dr. Valentina Lucks in to reconcile meds and discuss low BS. PCP made aware.  Hubbard Hartshorn, RN, BSN

## 2016-06-13 NOTE — Progress Notes (Signed)
59 year old male referred to pharmacy clinic by annual wellness visit RN due to patients medication list being incorrect and patient reporting hypoglycemic episodes. Patient reports frequent hypoglycemia and is seeing an endocrinologist at the [facility].  Patient states he was previously on Lantus 100 units but now has been switched to Novolog 30 units TID + QHS.     Plan: Reviewed and updated patients medication list Will decrease Novolog to 25 units TID + QHS Patient will followup with Dr [name] in early January  Patient seen with Nicholas Lose, PharmD, BCPS, CPP and medication changes were discussed with Dr Gerarda Fraction today in clinic.

## 2016-06-27 ENCOUNTER — Ambulatory Visit (AMBULATORY_SURGERY_CENTER): Payer: Self-pay | Admitting: *Deleted

## 2016-06-27 ENCOUNTER — Encounter: Payer: Self-pay | Admitting: *Deleted

## 2016-06-27 VITALS — Ht 71.0 in | Wt 181.0 lb

## 2016-06-27 DIAGNOSIS — Z1211 Encounter for screening for malignant neoplasm of colon: Secondary | ICD-10-CM

## 2016-06-27 MED ORDER — NA SULFATE-K SULFATE-MG SULF 17.5-3.13-1.6 GM/177ML PO SOLN: ORAL | 0 refills | Status: DC

## 2016-06-27 NOTE — Progress Notes (Signed)
Opened in error

## 2016-06-27 NOTE — Progress Notes (Signed)
No egg or soy allergy  No diet medications taken  No anesthesia or intubation problems  No home oxygen used or hx of sleep apnea

## 2016-07-08 ENCOUNTER — Encounter: Payer: Self-pay | Admitting: Family Medicine

## 2016-07-08 ENCOUNTER — Ambulatory Visit (INDEPENDENT_AMBULATORY_CARE_PROVIDER_SITE_OTHER): Payer: Commercial Managed Care - HMO | Admitting: Family Medicine

## 2016-07-08 DIAGNOSIS — E1140 Type 2 diabetes mellitus with diabetic neuropathy, unspecified: Secondary | ICD-10-CM

## 2016-07-08 DIAGNOSIS — Q231 Congenital insufficiency of aortic valve: Secondary | ICD-10-CM

## 2016-07-08 DIAGNOSIS — Z720 Tobacco use: Secondary | ICD-10-CM | POA: Diagnosis not present

## 2016-07-08 DIAGNOSIS — I10 Essential (primary) hypertension: Secondary | ICD-10-CM | POA: Diagnosis not present

## 2016-07-08 DIAGNOSIS — Z794 Long term (current) use of insulin: Secondary | ICD-10-CM

## 2016-07-08 DIAGNOSIS — N509 Disorder of male genital organs, unspecified: Secondary | ICD-10-CM

## 2016-07-08 DIAGNOSIS — N5089 Other specified disorders of the male genital organs: Secondary | ICD-10-CM

## 2016-07-08 NOTE — Patient Instructions (Addendum)
Follow up with your endocrinologist at the [facility] later this month as scheduled Looks like your sugars are doing some better  Have them send records from your eye exam to [place]: [facility]  [place], [place] [address] Fax [phone] Phone [phone]  See me in 3 months, sooner if you aren't able to get in there at the [facility]  Be well, Dr. [name]

## 2016-07-08 NOTE — Progress Notes (Signed)
Date of Visit: 07/08/2016   HPI:  Patient presents for follow up.  Diabetes - currently taking novolog 25 units four times a day . Patient also endorses taking lantus 60 units at his discretion, about once per week. Brought in CBG monitor with him today. Several weeks ago had one low sugar of 57, the rest are mostly over 100, with most of them actually in the 200-300 range. Got his eye exam done at the [place]. He has been following with an endocrinologist at the [facility] and has an appointment with them at the end of this month. Knows what to do if he has a low sugar.  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 Discussed with him today. He and his wife talked it over and decided that since the problem with his testicle has been there for a long time and is stable, they did not want to proceed with any further testing right now. He will let me know if it changes at all.  Hypertension - taking lisinopril 5mg  daily and also lasix  Tobacco abuse - patient does not want to quit smoking at this time. He is contemplating it but is not yet ready.  ROS: See HPI.  [place]: history of hypertension, COPD, bicuspid aortic valve, diabetic retinopathy, tobacco abuse  PHYSICAL EXAM: BP 126/88   Pulse 66   Temp 98 F (36.7 C) (Oral)   Wt 183 lb (83 kg)   BMI 25.52 kg/m  Gen: no acute distress, pleasant, cooperative HEENT: normocephalic, atraumatic  Heart: regular rate and rhythm, no murmur Lungs: clear to auscultation bilaterally, normal work of breathing  Neuro: alert grossly nonfocal speech normal Ext: No appreciable lower extremity edema bilaterally   ASSESSMENT/PLAN:  Health maintenance:  -patient reports getting zostavax done at [facility], we will request records -has appointment this week for colonoscopy -eye exam done at [facility] - patient will have records sent to [place]  Essential hypertension Well controlled, just on lasix and low dose lisinopril  (presumably for renal protection, I believe this was prescribed to him by a [place] doctor). Continue current regimen.  Tobacco abuse Patient not interested in quitting at this time. Encouraged him to let me know if/when he is ready so we can discuss cessation techniques.  Type 2 diabetes mellitus with neurologic complication, with long-term current use of insulin (HCC) A1c 8.0 in November. He is following with the [facility] endocrinologist. Much fewer low sugars since decreasing novolog dosing to 25units four times a day. I will hold off on making any further changes to his regimen right now since he is seeing endocrine later this month. He really should follow up with them - he and I have never been successful at getting diabetes under control in the past (mostly related to patient dosing insulin however he wants rather than following my recommendations). I did discuss with him today that self-dosing 60 units of lantus once per week has the potential to drop him very low and that I recommend against this. Follow up with me in 3 months, though he was instructed to follow up here sooner if he has any issues getting in with the doctor at the [place].  BICUSPID AORTIC VALVE Noted on history, have never actually discussed this issue with patient before. Reviewed records after visit- had echo in November 2011, during which bicuspid aortic valve was noted. No stenosis, just trivial regurgitation at that time. No follow up echos visible in the system. Last visit  with cardiology was early 2015 - done for claudication symptoms. There was mention in the note of bicuspid aortic valve but no recommendations regarding monitoring of this.  Will plan to address this at future visit and get updated echo.  Testicular mass Patient now declining ultrasound or further workup given longstanding stable nature of this problem. He will let me know if it changes at all.  FOLLOW UP: Follow up in 3 months for chronic medical  problems  [name], [place]

## 2016-07-09 ENCOUNTER — Telehealth: Payer: Self-pay | Admitting: Internal Medicine

## 2016-07-10 ENCOUNTER — Encounter: Payer: Self-pay | Admitting: Internal Medicine

## 2016-07-10 ENCOUNTER — Ambulatory Visit (AMBULATORY_SURGERY_CENTER): Payer: Commercial Managed Care - HMO | Admitting: Internal Medicine

## 2016-07-10 VITALS — BP 102/60 | HR 67 | Temp 98.6°F | Resp 16 | Ht 71.0 in | Wt 181.0 lb

## 2016-07-10 DIAGNOSIS — Z1212 Encounter for screening for malignant neoplasm of rectum: Secondary | ICD-10-CM

## 2016-07-10 DIAGNOSIS — K621 Rectal polyp: Secondary | ICD-10-CM

## 2016-07-10 DIAGNOSIS — Z1211 Encounter for screening for malignant neoplasm of colon: Secondary | ICD-10-CM | POA: Diagnosis present

## 2016-07-10 DIAGNOSIS — J449 Chronic obstructive pulmonary disease, unspecified: Secondary | ICD-10-CM | POA: Diagnosis not present

## 2016-07-10 DIAGNOSIS — D128 Benign neoplasm of rectum: Secondary | ICD-10-CM

## 2016-07-10 DIAGNOSIS — E119 Type 2 diabetes mellitus without complications: Secondary | ICD-10-CM | POA: Diagnosis not present

## 2016-07-10 LAB — GLUCOSE, CAPILLARY
GLUCOSE-CAPILLARY: 246 mg/dL — AB (ref 65–99)
Glucose-Capillary: 217 mg/dL — ABNORMAL HIGH (ref 65–99)

## 2016-07-10 MED ORDER — SODIUM CHLORIDE 0.9 % IV SOLN: 500.0000 mL | INTRAVENOUS | Status: DC

## 2016-07-10 NOTE — Progress Notes (Signed)
Spontaneous respirations throughout. VSS. Resting comfortably. To PACU on room air. Report to  Jane RN. 

## 2016-07-10 NOTE — Assessment & Plan Note (Addendum)
A1c 8.0 in November. He is following with the [facility] endocrinologist. Much fewer low sugars since decreasing novolog dosing to 25units four times a day. I will hold off on making any further changes to his regimen right now since he is seeing endocrine later this month. He really should follow up with them - he and I have never been successful at getting diabetes under control in the past (mostly related to patient dosing insulin however he wants rather than following my recommendations). I did discuss with him today that self-dosing 60 units of lantus once per week has the potential to drop him very low and that I recommend against this. Follow up with me in 3 months, though he was instructed to follow up here sooner if he has any issues getting in with the doctor at the [place].

## 2016-07-10 NOTE — Assessment & Plan Note (Signed)
Noted on history, have never actually discussed this issue with patient before. Reviewed records after visit- had echo in November 2011, during which bicuspid aortic valve was noted. No stenosis, just trivial regurgitation at that time. No follow up echos visible in the system. Last visit with cardiology was early 2015 - done for claudication symptoms. There was mention in the note of bicuspid aortic valve but no recommendations regarding monitoring of this.  Will plan to address this at future visit and get updated echo.

## 2016-07-10 NOTE — Telephone Encounter (Signed)
Called pt and pt states he will be here later this afternoon, pt has colon appt later today.

## 2016-07-10 NOTE — Progress Notes (Signed)
Called to room to assist during endoscopic procedure.  Patient ID and intended procedure confirmed with present staff. Received instructions for my participation in the procedure from the performing physician.  

## 2016-07-10 NOTE — Assessment & Plan Note (Signed)
Patient not interested in quitting at this time. Encouraged him to let me know if/when he is ready so we can discuss cessation techniques.

## 2016-07-10 NOTE — Assessment & Plan Note (Signed)
Patient now declining ultrasound or further workup given longstanding stable nature of this problem. He will let me know if it changes at all.

## 2016-07-10 NOTE — Patient Instructions (Signed)
Impression/Recommendations:  [place] handout given to patient.  Repeat colonoscopy in 10 years for surveillance.  Resume previous diet. Continue present medications.  YOU HAD AN ENDOSCOPIC PROCEDURE TODAY AT [facility]:   Refer to the procedure report that was given to you for any specific questions about what was found during the examination.  If the procedure report does not answer your questions, please call your gastroenterologist to clarify.  If you requested that your care partner not be given the details of your procedure findings, then the procedure report has been included in a sealed envelope for you to review at your convenience later.  YOU SHOULD EXPECT: Some feelings of bloating in the abdomen. Passage of more gas than usual.  Walking can help get rid of the air that was put into your GI tract during the procedure and reduce the bloating. If you had a lower endoscopy (such as a colonoscopy or flexible sigmoidoscopy) you may notice spotting of blood in your stool or on the toilet paper. If you underwent a bowel prep for your procedure, you may not have a normal bowel movement for a few days.  Please Note:  You might notice some irritation and congestion in your nose or some drainage.  This is from the oxygen used during your procedure.  There is no need for concern and it should clear up in a day or so.  SYMPTOMS TO REPORT IMMEDIATELY:   Following lower endoscopy (colonoscopy or flexible sigmoidoscopy):  Excessive amounts of blood in the stool  Significant tenderness or worsening of abdominal pains  Swelling of the abdomen that is new, acute  Fever of 100F or higher For urgent or emergent issues, a gastroenterologist can be reached at any hour by calling [phone].   DIET:  We do recommend a small meal at first, but then you may proceed to your regular diet.  Drink plenty of fluids but you should avoid alcoholic beverages for 24 hours.  ACTIVITY:  You  should plan to take it easy for the rest of today and you should NOT DRIVE or use heavy machinery until tomorrow (because of the sedation medicines used during the test).    FOLLOW UP: Our staff will call the number listed on your records the next business day following your procedure to check on you and address any questions or concerns that you may have regarding the information given to you following your procedure. If we do not reach you, we will leave a message.  However, if you are feeling well and you are not experiencing any problems, there is no need to return our call.  We will assume that you have returned to your regular daily activities without incident.  If any biopsies were taken you will be contacted by phone or by letter within the next 1-3 weeks.  Please call us at [phone] if you have not heard about the biopsies in 3 weeks.    SIGNATURES/CONFIDENTIALITY: You and/or your care partner have signed paperwork which will be entered into your electronic medical record.  These signatures attest to the fact that that the information above on your After Visit Summary has been reviewed and is understood.  Full responsibility of the confidentiality of this discharge information lies with you and/or your care-partner.

## 2016-07-10 NOTE — Assessment & Plan Note (Addendum)
Well controlled, just on lasix and low dose lisinopril (presumably for renal protection, I believe this was prescribed to him by a [place]). Continue current regimen.

## 2016-07-11 ENCOUNTER — Telehealth: Payer: Self-pay

## 2016-07-11 ENCOUNTER — Encounter: Payer: Commercial Managed Care - HMO | Admitting: Internal Medicine

## 2016-07-11 NOTE — Telephone Encounter (Signed)
  Follow up Call-  Call back number 07/10/2016 04/07/2015  Post procedure Call Back phone  # [phone] - wife cell phone  Permission to leave phone message Yes Yes  Some recent data might be hidden    Patient was called for follow up after his procedure on 07/10/2016. No answer at the number given for follow up phone call. I was not able to leave a message.

## 2016-07-11 NOTE — Op Note (Addendum)
[facility] Patient Name: [facility] Procedure Date: 07/10/2016 3:32 PM MRN: [id number] Endoscopist: Docia Chuck. Henrene Pastor , MD Age: 61 Referring MD:  Date of Birth: 03-Nov-1955 Gender: Male Account #: [id number] Procedure:                Colonoscopy, with cold snare polypectomy x 1 Indications:              Screening for colorectal malignant neoplasm.                            Examination in 2008 with hyperplastic polyps (no                            report); examination October 2016 with poor prep.                            Now for repeat colonoscopy with more extensive                            preparation Medicines:                Monitored Anesthesia Care Procedure:                Pre-Anesthesia Assessment:                           - Prior to the procedure, a History and Physical                            was performed, and patient medications and                            allergies were reviewed. The patient's tolerance of                            previous anesthesia was also reviewed. The risks                            and benefits of the procedure and the sedation                            options and risks were discussed with the patient.                            All questions were answered, and informed consent                            was obtained. Prior Anticoagulants: The patient has                            taken no previous anticoagulant or antiplatelet                            agents. ASA Grade Assessment: II - A patient with  mild systemic disease. After reviewing the risks                            and benefits, the patient was deemed in                            satisfactory condition to undergo the procedure.                           After obtaining informed consent, the colonoscope                            was passed under direct vision. Throughout the                            procedure, the patient's blood  pressure, pulse, and                            oxygen saturations were monitored continuously. The                            Model CF-HQ190L [id number]) scope was introduced                            through the anus and advanced to the the cecum,                            identified by appendiceal orifice and ileocecal                            valve. The ileocecal valve, appendiceal orifice,                            and rectum were photographed. The quality of the                            bowel preparation was excellent. The colonoscopy                            was performed without difficulty. The patient                            tolerated the procedure well. The bowel preparation                            used was 2 day preparation including magnesium                            citrate and SUPREP. Scope In: 3:42:41 PM Scope Out: 3:57:17 PM Scope Withdrawal Time: 0 hours 11 minutes 24 seconds  Total Procedure Duration: 0 hours 14 minutes 36 seconds  Findings:                 A 3 mm polyp was found in the rectum. The polyp was  removed with a cold snare. Resection and retrieval                            were complete.                           The exam was otherwise without abnormality on                            direct and retroflexion views. Complications:            No immediate complications. Estimated blood loss:                            None. Estimated Blood Loss:     Estimated blood loss: none. Impression:               - One 3 mm polyp in the rectum, removed with a cold                            snare. Resected and retrieved.                           - The examination was otherwise normal on direct                            and retroflexion views. Recommendation:           - Repeat colonoscopy in 10 years for surveillance.                           - Patient has a contact number available for                             emergencies. The signs and symptoms of potential                            delayed complications were discussed with the                            patient. Return to normal activities tomorrow.                            Written discharge instructions were provided to the                            patient.                           - Resume previous diet.                           - Continue present medications.                           - Await pathology results. Docia Chuck. Henrene Pastor, MD 07/10/2016 4:04:57 PM This report has been signed electronically.

## 2016-07-11 NOTE — Telephone Encounter (Signed)
  Follow up Call-  Call back number 07/10/2016 04/07/2015  Post procedure Call Back phone  # [phone] - wife cell phone  Permission to leave phone message Yes Yes  Some recent data might be hidden    Patient was called for follow up after his procedure on 07/10/2016. No answer at the number given for follow up phone call. I was not able to leave a message.

## 2016-07-18 ENCOUNTER — Encounter: Payer: Self-pay | Admitting: Internal Medicine

## 2016-07-23 ENCOUNTER — Ambulatory Visit (INDEPENDENT_AMBULATORY_CARE_PROVIDER_SITE_OTHER): Payer: Commercial Managed Care - HMO | Admitting: Orthopedic Surgery

## 2016-07-24 ENCOUNTER — Other Ambulatory Visit: Payer: Self-pay | Admitting: Family Medicine

## 2016-07-24 DIAGNOSIS — IMO0002 Reserved for concepts with insufficient information to code with codable children: Secondary | ICD-10-CM

## 2016-07-24 DIAGNOSIS — E1121 Type 2 diabetes mellitus with diabetic nephropathy: Secondary | ICD-10-CM

## 2016-07-24 DIAGNOSIS — E1165 Type 2 diabetes mellitus with hyperglycemia: Principal | ICD-10-CM

## 2016-07-24 DIAGNOSIS — Z794 Long term (current) use of insulin: Principal | ICD-10-CM

## 2016-08-12 ENCOUNTER — Ambulatory Visit: Payer: Medicare HMO | Admitting: Podiatry

## 2016-08-26 ENCOUNTER — Ambulatory Visit (INDEPENDENT_AMBULATORY_CARE_PROVIDER_SITE_OTHER): Payer: Medicare HMO | Admitting: Orthopedic Surgery

## 2016-08-27 ENCOUNTER — Ambulatory Visit (INDEPENDENT_AMBULATORY_CARE_PROVIDER_SITE_OTHER): Payer: Medicare HMO | Admitting: Family

## 2016-08-27 ENCOUNTER — Ambulatory Visit (INDEPENDENT_AMBULATORY_CARE_PROVIDER_SITE_OTHER): Payer: Medicare HMO

## 2016-08-27 DIAGNOSIS — M79674 Pain in right toe(s): Secondary | ICD-10-CM

## 2016-08-27 NOTE — Progress Notes (Signed)
Office Visit Note   Patient: [place] [facility]           Date of Birth: [birth year]           MRN: [id number] Visit Date: 08/27/2016              Requested by: [name], MD [address] [place], [place] [address] PCP: Chrisandra Netters, MD  No chief complaint on file.   HPI: Patient is a 61 year old gentleman seen today for evaluation of concern for swelling left 3rd toe. Has history of osteomyelitis left great toe and 2nd toe. Is concerned that the 3rd toe is swollen and infected. Fears further amputation. Minimal pain. No aggravating or relieving factors.     Assessment & Plan: Visit Diagnoses:  1. Toe pain, right     Plan: Continue with daily foot checks. Did provide a toe spacer per his request. Recommended he wear compression stockings daily.  Follow-Up Instructions: Return if symptoms worsen or fail to improve.   Physical Exam  Constitutional: Appears well-developed.  Head: Normocephalic.  Eyes: EOM are normal.  Neck: Normal range of motion.  Cardiovascular: Normal rate.   Pulmonary/Chest: Effort normal.  Neurological: Is alert.  Skin: Skin is warm.  Psychiatric: Has a normal mood and affect. Right foot: 2+ pitting edema to bilateral lower legs. cannot appreciate any swelling to foot or toes. Does have fixed clawing of 3 and 4th toes right foot. No open ulcers. No callus. No tender areas. No erythema or drainage.  Ortho Exam   Imaging: No results found.  Orders:  Orders Placed This Encounter  Procedures  . XR Toe 3rd Right   No orders of the defined types were placed in this encounter.    Procedures: No procedures performed  Clinical Data: No additional findings.  Subjective: Review of Systems  Constitutional: Negative for chills and fever.  Musculoskeletal: Negative for gait problem.  Skin: Negative for color change and wound.    Objective: Vital Signs: There were no vitals taken for this visit.  Specialty Comments:  No  specialty comments available.  PMFS History: Patient Active Problem List   Diagnosis Date Noted  . Testicular mass 04/18/2016  . Thrush 04/18/2016  . Soft tissue swelling   . Osteomyelitis ([place]) 01/29/2016  . Diabetic polyneuropathy associated with diabetes mellitus due to underlying condition ([place])   . Type 2 diabetes mellitus with neurologic complication, with long-term current use of insulin ([place]) 12/08/2015  . Left shoulder pain 08/10/2015  . Pulmonary nodule 02/01/2015  . Screening for colon cancer 01/09/2015  . Lumbar back pain 03/21/2014  . Screening for STD (sexually transmitted disease) 12/15/2013  . Depression 12/09/2013  . Lightheadedness 11/19/2013  . Warts, genital 08/20/2013  . 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 Right leg pain 04/27/2013  . Diabetic retinopathy ([place]) 01/28/2013  . Visit for well man health check 11/19/2010  . Sleep apnea 09/06/2010  . 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 LEG EDEMA 03/03/2009  . Essential hypertension 11/09/2008  . COPD, mild ([place]) 10/06/2006  . SICKLE-CELL TRAIT 04/26/2006  . ERECTILE DYSFUNCTION 04/26/2006  . Tobacco abuse 04/26/2006  . Peripheral vascular disease ([place]) 04/26/2006  . ALLERGIC RHINITIS 04/26/2006   Past Medical History:  Diagnosis Date  . Allergy   . Arthritis   . Bronchitis   . Cataract   . Chronic kidney disease   . Claudication [facility])    right foot ray resection  . Colon polyps  hyperplastic  . COPD (chronic obstructive pulmonary disease) ([place])   . Diabetes mellitus   . Genital warts   . Gout   . Hyperlipidemia   . Hypertension     Family History  Problem Relation Age of Onset  . Diabetes Mother   . Stroke Mother   . Heart failure Father   . Colon cancer Neg Hx   . Esophageal cancer Neg Hx   . Rectal cancer Neg Hx   . Stomach cancer Neg Hx     Past Surgical History:  Procedure Laterality Date  . AMPUTATION Right 01/31/2016   Procedure: Right 2nd Toe  Amputation;  Surgeon: Newt Minion, MD;  Location: [facility];  Service: Orthopedics;  Laterality: Right;  . CATARACT EXTRACTION     right eye  . COLONOSCOPY    . I&D EXTREMITY  04/11/2012   Procedure: IRRIGATION AND DEBRIDEMENT EXTREMITY;  Surgeon: Wylene Simmer, MD;  Location: [facility];  Service: Orthopedics;  Laterality: Right;  . Surgery left great toe    . Tear ducts bilateral eyes     Social History   Occupational History  . disabled    Social History Main Topics  . Smoking status: Former Smoker    Packs/day: 0.30    Years: 48.00    Types: Cigars    Start date: 07/02/1963  . Smokeless tobacco: Former Systems developer     Comment: wants to use electric cigarettes.  not interested in pills, worried about chantix side effects  . Alcohol use 0.0 oz/week     Comment: occassional use  . Drug use: No  . Sexual activity: Not on file

## 2016-09-03 ENCOUNTER — Ambulatory Visit: Payer: Commercial Managed Care - HMO | Admitting: Family Medicine

## 2016-09-16 ENCOUNTER — Ambulatory Visit: Payer: Commercial Managed Care - HMO | Admitting: Internal Medicine

## 2016-09-24 ENCOUNTER — Ambulatory Visit (INDEPENDENT_AMBULATORY_CARE_PROVIDER_SITE_OTHER): Payer: Medicare HMO | Admitting: Orthopedic Surgery

## 2016-09-24 ENCOUNTER — Encounter (INDEPENDENT_AMBULATORY_CARE_PROVIDER_SITE_OTHER): Payer: Self-pay | Admitting: Orthopedic Surgery

## 2016-09-24 VITALS — Ht 71.0 in | Wt 181.0 lb

## 2016-09-24 DIAGNOSIS — E1142 Type 2 diabetes mellitus with diabetic polyneuropathy: Secondary | ICD-10-CM

## 2016-09-24 DIAGNOSIS — Z89421 Acquired absence of other right toe(s): Secondary | ICD-10-CM

## 2016-09-24 DIAGNOSIS — L97411 Non-pressure chronic ulcer of right heel and midfoot limited to breakdown of skin: Secondary | ICD-10-CM | POA: Insufficient documentation

## 2016-09-24 DIAGNOSIS — B351 Tinea unguium: Secondary | ICD-10-CM

## 2016-09-24 DIAGNOSIS — I87323 Chronic venous hypertension (idiopathic) with inflammation of bilateral lower extremity: Secondary | ICD-10-CM | POA: Insufficient documentation

## 2016-09-24 HISTORY — DX: Acquired absence of other right toe(s): Z89.421

## 2016-09-24 NOTE — Progress Notes (Signed)
Office Visit Note   Patient: [facility]           Date of Birth: October 04, 1955           MRN: [id number] Visit Date: 09/24/2016              Requested by: [name], MD [address] [place], [place] [address] PCP: Chrisandra Netters, MD  Chief Complaint  Patient presents with  . Right Foot - Follow-up  . Left Foot - Follow-up    HPI: Patient states that he tried wearing some compression stockings that he got a flea market and he states that these did not help. He is concerned that the real support stockings are expensive medical supply stores. He has been full weightbearing with orthotics with extra-depth shoes status post amputations of toes 2 on the right he is currently wearing nitroglycerin patches on both feet.   Assessment & Plan: Visit Diagnoses:  1. Status post amputation of toe of right foot ([name])   2. Diabetic polyneuropathy associated with type 2 diabetes mellitus ([name])   3. Idiopathic chronic venous hypertension of both lower extremities with inflammation   4. Onychomycosis   5. Midfoot skin ulcer, right, limited to breakdown of skin ([name])     Plan: Ulcer debridement of skin soft tissue 1 right foot nails trimmed 8 without complications follow-up the office in 3 months. Again recommend importance of wearing 15-20 mm medical compression stockings to minimize risk of ulcerations in both legs.  Follow-Up Instructions: Return in about 3 months (around 12/25/2016).   Ortho Exam  Patient is alert, oriented, no adenopathy, well-dressed, normal affect, normal respiratory effort. Patient has an antalgic gait difficulty getting from sitting to standing position. 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 Patient has thick and discolored onychomycotic nails 8 he is unable safely trim the nails on his own due to his diabetic insensate neuropathy and  poor circulation with ulceration on the right foot. 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  Imaging: No results found.  Labs: Lab Results  Component Value Date   HGBA1C 8.0 05/10/2016   HGBA1C 10.5 (H) 01/29/2016   HGBA1C 8.9 11/15/2015   ESRSEDRATE 8 01/29/2016   ESRSEDRATE 9 04/11/2012   CRP 2.6 (H) 01/29/2016   CRP 0.5 02/28/2014   REPTSTATUS 02/03/2016 FINAL 01/29/2016   REPTSTATUS 02/03/2016 FINAL 01/29/2016   GRAMSTAIN  04/11/2012    NO WBC SEEN RARE SQUAMOUS EPITHELIAL CELLS PRESENT MODERATE GRAM POSITIVE COCCI IN PAIRS RARE GRAM NEGATIVE RODS   GRAMSTAIN  04/11/2012    NO WBC SEEN RARE SQUAMOUS EPITHELIAL CELLS PRESENT MODERATE GRAM POSITIVE COCCI IN PAIRS RARE GRAM NEGATIVE RODS   CULT NO GROWTH 5 DAYS 01/29/2016   CULT NO GROWTH 5 DAYS 01/29/2016   LABORGA NO GROWTH 2 DAYS 07/19/2014    Orders:  No orders of the defined types were placed in this encounter.  No orders of the defined types were placed in this encounter.    Procedures: No procedures performed  Clinical Data: No additional findings.  ROS:  All other systems negative, except as noted in the HPI. Review of Systems  Objective: Vital Signs: Ht 5\' 11"  (  1.803 m)   Wt 181 lb (82.1 kg)   BMI 25.24 kg/m   Specialty Comments:  No specialty comments available.  PMFS History: Patient Active Problem List   Diagnosis Date Noted  . Status post amputation of toe of right foot ([facility]) 09/24/2016  . Idiopathic chronic venous hypertension of both lower extremities with inflammation 09/24/2016  . Onychomycosis 09/24/2016  . Midfoot skin ulcer, right, limited to breakdown of skin ([place] [facility]) 09/24/2016  . Testicular mass 04/18/2016  . Thrush 04/18/2016  . Soft tissue swelling   . Osteomyelitis ([name]) 01/29/2016  .  Diabetic polyneuropathy associated with type 2 diabetes mellitus ([place] [facility])   . Type 2 diabetes mellitus with neurologic complication, with long-term current use of insulin ([place] [facility]) 12/08/2015  . Left shoulder pain 08/10/2015  . Pulmonary nodule 02/01/2015  . Screening for colon cancer 01/09/2015  . Lumbar back pain 03/21/2014  . Screening for STD (sexually transmitted disease) 12/15/2013  . Depression 12/09/2013  . Lightheadedness 11/19/2013  . Warts, genital 08/20/2013  . 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 Right leg pain 04/27/2013  . Diabetic retinopathy ([place] [facility]) 01/28/2013  . Visit for well man health check 11/19/2010  . Sleep apnea 09/06/2010  . 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 LEG EDEMA 03/03/2009  . Essential hypertension 11/09/2008  . COPD, mild ([place] [facility]) 10/06/2006  . SICKLE-CELL TRAIT 04/26/2006  . ERECTILE DYSFUNCTION 04/26/2006  . Tobacco abuse 04/26/2006  . Peripheral vascular disease ([place] [facility]) 04/26/2006  . ALLERGIC RHINITIS 04/26/2006   Past Medical History:  Diagnosis Date  . Allergy   . Arthritis   . Bronchitis   . Cataract   . Chronic kidney disease   . Claudication [facility])    right foot ray resection  . Colon polyps    hyperplastic  . COPD (chronic obstructive pulmonary disease) ([place] [facility])   . Diabetes mellitus   . Genital warts   . Gout   . Hyperlipidemia   . Hypertension     Family History  Problem Relation Age of Onset  . Diabetes Mother   . Stroke Mother   . Heart failure Father   . Colon cancer Neg Hx   . Esophageal cancer Neg Hx   . Rectal cancer Neg Hx   . Stomach cancer Neg Hx     Past Surgical History:  Procedure Laterality Date  . AMPUTATION Right 01/31/2016   Procedure: Right 2nd Toe Amputation;  Surgeon: Newt Minion, MD;  Location: [place] [facility];  Service: Orthopedics;  Laterality: Right;  . CATARACT EXTRACTION     right eye  . COLONOSCOPY    . I&D EXTREMITY  04/11/2012   Procedure: IRRIGATION AND DEBRIDEMENT EXTREMITY;   Surgeon: Wylene Simmer, MD;  Location: [facility];  Service: Orthopedics;  Laterality: Right;  . Surgery left great toe    . Tear ducts bilateral eyes     Social History   Occupational History  . disabled    Social History Main Topics  . Smoking status: Former Smoker    Packs/day: 0.30    Years: 48.00    Types: Cigars    Start date: 07/02/1963  . Smokeless tobacco: Former Systems developer     Comment: wants to use electric cigarettes.  not interested in pills, worried about chantix side effects  . Alcohol use 0.0 oz/week     Comment: occassional use  . Drug use: No  . Sexual activity: Not on file

## 2016-10-23 ENCOUNTER — Ambulatory Visit: Payer: Commercial Managed Care - HMO | Admitting: Family Medicine

## 2016-11-11 ENCOUNTER — Ambulatory Visit (HOSPITAL_COMMUNITY)
Admission: EM | Admit: 2016-11-11 | Discharge: 2016-11-11 | Disposition: A | Discharge status: Home / Self Care | Payer: Medicare HMO | Attending: Internal Medicine | Admitting: Internal Medicine

## 2016-11-11 ENCOUNTER — Encounter (HOSPITAL_COMMUNITY): Payer: Self-pay | Admitting: Emergency Medicine

## 2016-11-11 DIAGNOSIS — M7989 Other specified soft tissue disorders: Secondary | ICD-10-CM

## 2016-11-11 DIAGNOSIS — L03115 Cellulitis of right lower limb: Secondary | ICD-10-CM

## 2016-11-11 MED ORDER — DOXYCYCLINE HYCLATE 100 MG PO CAPS: 100.0000 mg | ORAL_CAPSULE | Freq: Two times a day (BID) | ORAL | 0 refills | Status: DC

## 2016-11-11 NOTE — ED Provider Notes (Signed)
CSN: [id number]     Arrival date & time 11/11/16  1448 History   First MD Initiated Contact with Patient 11/11/16 1600     Chief Complaint  Patient presents with  . Leg Swelling   (Consider location/radiation/quality/duration/timing/severity/associated sxs/prior Treatment) 61 year old male with past history of diabetes, hypertension, high cholesterol, claudication, and amputation of the right great toe presents to clinic for 2 weeks of swelling in his right foot and ankle. He denies any pain, or discomfort, states he's had no recent hospitalizations, recent travel, recent immobility, he does smoke, no history of clotting disorders. His great toe on the right foot was removed 01/31/2016. He reports he has not been wearing his compression stockings, as he does not believe they work.     The history is provided by the patient.    Past Medical History:  Diagnosis Date  . Allergy   . Arthritis   . Bronchitis   . Cataract   . Chronic kidney disease   . Claudication [facility])    right foot ray resection  . Colon polyps    hyperplastic  . COPD (chronic obstructive pulmonary disease) ([place])   . Diabetes mellitus   . Genital warts   . Gout   . Hyperlipidemia   . Hypertension    Past Surgical History:  Procedure Laterality Date  . AMPUTATION Right 01/31/2016   Procedure: Right 2nd Toe Amputation;  Surgeon: Newt Minion, MD;  Location: [name];  Service: Orthopedics;  Laterality: Right;  . CATARACT EXTRACTION     right eye  . COLONOSCOPY    . I&D EXTREMITY  04/11/2012   Procedure: IRRIGATION AND DEBRIDEMENT EXTREMITY;  Surgeon: Wylene Simmer, MD;  Location: [facility];  Service: Orthopedics;  Laterality: Right;  . Surgery left great toe    . Tear ducts bilateral eyes     Family History  Problem Relation Age of Onset  . Diabetes Mother   . Stroke Mother   . Heart failure Father   . Colon cancer Neg Hx   . Esophageal cancer Neg Hx   . Rectal cancer Neg Hx   . Stomach cancer Neg Hx     Social History  Substance Use Topics  . Smoking status: Former Smoker    Packs/day: 0.30    Years: 48.00    Types: Cigars    Start date: 07/02/1963  . Smokeless tobacco: Former Systems developer     Comment: wants to use electric cigarettes.  not interested in pills, worried about chantix side effects  . Alcohol use 0.0 oz/week     Comment: occassional use    Review of Systems  Constitutional: Negative.   HENT: Negative.   Respiratory: Negative.   Cardiovascular: Negative.   Gastrointestinal: Negative.   Musculoskeletal: Positive for joint swelling. Negative for back pain.  Skin: Positive for color change and wound.  Neurological: Negative.     Allergies  Patient has no known allergies.  Home Medications   Prior to Admission medications   Medication Sig Start Date End Date Taking? Authorizing Provider  ACCU-CHEK FASTCLIX LANCETS [place]  12/30/14   [provider]  ACCU-CHEK SOFTCLIX LANCETS lancets Use as instructed 11/16/15   [name], MD  acetaminophen (TYLENOL) 500 MG tablet Take 2 tablets (1,000 mg total) by mouth every 8 (eight) hours as needed. 10/02/15   [name], MD  albuterol (PROVENTIL HFA;VENTOLIN HFA) 108 (90 Base) MCG/ACT inhaler Inhale 2 puffs into the lungs every 6 (six) hours as needed for wheezing  or shortness of breath. 07/27/15   [name], MD  aspirin EC 81 MG tablet Take 81 mg by mouth daily.    [provider]  baclofen (LIORESAL) 10 MG tablet Take 1 tablet (10 mg total) by mouth 3 (three) times daily as needed for muscle spasms. 06/13/16   Katheren Shams, DO  BD INSULIN SYRINGE ULTRAFINE 31G X 5/16" 0.3 ML MISC  12/30/14   [provider]  Blood Glucose Calibration (ACCU-CHEK SMARTVIEW CONTROL) LIQD  12/30/14   [provider]  Blood Glucose Monitoring Suppl (ACCU-CHEK AVIVA) device Use as instructed to check blood sugar 3 times per day 11/15/15 11/14/16  [name], MD  Cholecalciferol (VITAMIN D)  2000 units CAPS Take 1 capsule by mouth daily.    [provider]  diphenhydrAMINE (BENADRYL) 25 MG tablet Take 25 mg by mouth at bedtime as needed.    [provider]  doxycycline (VIBRAMYCIN) 100 MG capsule Take 1 capsule (100 mg total) by mouth 2 (two) times daily. 11/11/16   Barnet Glasgow, NP  furosemide (LASIX) 20 MG tablet Take 20 mg by mouth at bedtime.    [provider]  glucose blood (ACCU-CHEK AVIVA) test strip Use as instructed 11/16/15   [name], MD  ibuprofen (ADVIL,MOTRIN) 600 MG tablet Take 1 tablet (600 mg total) by mouth every 6 (six) hours as needed. 06/13/16   Katheren Shams, DO  imiquimod (ALDARA) 5 % cream Apply topically 3 (three) times a week. Patient taking differently: Apply topically 2 (two) times a week.  05/22/16   Riddle Bing, DO  insulin aspart (NOVOLOG) 100 UNIT/ML injection Inject 25 Units into the skin 4 (four) times daily. 06/13/16   [name], MD  lisinopril (PRINIVIL,ZESTRIL) 5 MG tablet Take 5 mg by mouth daily.    [provider]  metFORMIN (GLUCOPHAGE) 1000 MG tablet Take 1,000 mg by mouth 2 (two) times daily with a meal.    [provider]  nitroGLYCERIN (NITRO-DUR) 0.2 mg/hr patch Place 1 patch (0.2 mg total) onto the skin daily. 05/17/16   Newt Minion, MD  rosuvastatin (CRESTOR) 20 MG tablet TAKE 1 TABLET BY MOUTH DAILY 07/24/16   [name], MD  traMADol (ULTRAM) 50 MG tablet Take 50 mg by mouth every 6 (six) hours as needed.    [provider]   Meds Ordered and Administered this Visit  Medications - No data to display  BP 126/68 (BP Location: Right Arm)   Pulse 61   Temp 97.8 F (36.6 C) (Oral)   Resp 14   SpO2 100%  No data found.   Physical Exam  Constitutional: He is oriented to person, place, and time. He appears well-developed and well-nourished. No distress.  HENT:  Head: Normocephalic.  Right Ear: External ear normal.  Left Ear: External  ear normal.  Eyes: Conjunctivae are normal.  Neck: Normal range of motion.  Cardiovascular: Normal rate and regular rhythm.   Pulmonary/Chest: Effort normal and breath sounds normal.  Neurological: He is alert and oriented to person, place, and time.  Skin: Skin is warm and dry. He is not diaphoretic.  See attached photograph  Psychiatric: He has a normal mood and affect. His behavior is normal.  Nursing note and vitals reviewed.       Urgent Care Course     Procedures (including critical care time)  Labs Review Labs Reviewed - No data to display  Imaging Review No results found.  MDM   1. Leg swelling   2. Cellulitis of right lower extremity    Patient counseled on the importance of wearing his compression stockings, started on doxycycline, encouraged to schedule an appointment with either Dr. Sharol Given, or his primary care provider in 2-3 days for reevaluation. Further advised to go to the emergency room at any time symptoms worsen.     Barnet Glasgow, NP 11/11/16 [phone]

## 2016-11-11 NOTE — Discharge Instructions (Signed)
I have started you on doxycyline, when you leave here, go to or call your primary care provider and schedule an appointment for recheck in two to three days. Wear your compression stockings as this will reduce the swelling in your leg. Also schedule an appointment with Dr. Sharol Given for reevaluation of your leg.  If your symptoms worsen, go to the ER.

## 2016-11-11 NOTE — ED Triage Notes (Signed)
The patient presented to the [facility] with a complaint of right lower leg swelling x 3 weeks.

## 2016-11-14 ENCOUNTER — Ambulatory Visit (INDEPENDENT_AMBULATORY_CARE_PROVIDER_SITE_OTHER): Payer: Medicare HMO | Admitting: Family

## 2016-11-14 ENCOUNTER — Encounter (INDEPENDENT_AMBULATORY_CARE_PROVIDER_SITE_OTHER): Payer: Self-pay | Admitting: Family

## 2016-11-14 VITALS — Ht 71.0 in | Wt 181.0 lb

## 2016-11-14 DIAGNOSIS — I872 Venous insufficiency (chronic) (peripheral): Secondary | ICD-10-CM | POA: Diagnosis not present

## 2016-11-14 NOTE — Progress Notes (Signed)
Office Visit Note   Patient: Adam Ray           Date of Birth: 12-Oct-1955           MRN: [id number] Visit Date: 11/14/2016              Requested by: [name], [place] [place], [place] [address] PCP: [name], MD  Chief Complaint  Patient presents with  . Right Leg - Follow-up    ER visit 11/11/16 RLE swelling      HPI: The patient is a 61 year old gentleman presents today in follow-up following a urgent care visit on May 14. Is concerned for swelling to his right lower extremity. Has been advised multiple times in the past to wear compression stockings daily. Today is wearing a regular knee-high sock with an Ace wrap around his lower extremity up to the tibial tubercle. The Ace wrap unfortunately it is stretched out and worn out. Was seen in urgent care and started on doxycycline for cellulitis 2 days ago. His wife is concerned that his swelling may be due to heart failure. He has no history of heart failure no chest pain no shortness of breath no dyspnea on exertion.  Of note did have ABIs during a hospitalization about a year ago. These were suggestive of normal circulation ABI of 1 on the left and 1.1 on the right.   Assessment & Plan: Visit Diagnoses:  1. Venous insufficiency (chronic) (peripheral)     Plan: Again discussed the importance of daily wear of compression garments in a prescription provided an order as well as direction to go to [facility] medical supply to come obtain 20-30 mmHg compression stockings. He'll follow-up in office in 6 weeks. Advised him to complete the course of doxycycline.  Follow-Up Instructions: Return in about 6 weeks (around 12/26/2016).   Ortho Exam  Patient is alert, oriented, no adenopathy, well-dressed, normal affect, normal respiratory effort. Bilateral lower extremities with 3+ pitting edema up to the tibial tubercle there is dry flaking skin some brawny skin color changes. On the right unable  to palpate a dorsalis pedis pulse. With an ultrasound I'm able to auscultate a biphasic pulse. The toes and foot are normothermic.  Imaging: No results found.  Labs: Lab Results  Component Value Date   HGBA1C 8.0 05/10/2016   HGBA1C 10.5 (H) 01/29/2016   HGBA1C 8.9 11/15/2015   ESRSEDRATE 8 01/29/2016   ESRSEDRATE 9 04/11/2012   CRP 2.6 (H) 01/29/2016   CRP 0.5 02/28/2014   REPTSTATUS 02/03/2016 FINAL 01/29/2016   REPTSTATUS 02/03/2016 FINAL 01/29/2016   GRAMSTAIN  04/11/2012    NO WBC SEEN RARE SQUAMOUS EPITHELIAL CELLS PRESENT MODERATE GRAM POSITIVE COCCI IN PAIRS RARE GRAM NEGATIVE RODS   GRAMSTAIN  04/11/2012    NO WBC SEEN RARE SQUAMOUS EPITHELIAL CELLS PRESENT MODERATE GRAM POSITIVE COCCI IN PAIRS RARE GRAM NEGATIVE RODS   CULT NO GROWTH 5 DAYS 01/29/2016   CULT NO GROWTH 5 DAYS 01/29/2016   LABORGA NO GROWTH 2 DAYS 07/19/2014    Orders:  No orders of the defined types were placed in this encounter.  No orders of the defined types were placed in this encounter.    Procedures: No procedures performed  Clinical Data: No additional findings.  ROS:  All other systems negative, except as noted in the HPI. Review of Systems  Constitutional: Negative for chills and fever.  Respiratory: Negative for cough, chest tightness and shortness of breath.  Cardiovascular: Positive for leg swelling. Negative for chest pain.  Musculoskeletal: Negative for myalgias.  Skin: Negative for wound.  Neurological: Negative for tremors.    Objective: Vital Signs: Ht 5\' 11"  (1.803 m)   Wt 181 lb (82.1 kg)   BMI 25.24 kg/m   Specialty Comments:  No specialty comments available.  PMFS History: Patient Active Problem List   Diagnosis Date Noted  . Status post amputation of toe of right foot ([place]) 09/24/2016  . Idiopathic chronic venous hypertension of both lower extremities with inflammation 09/24/2016  . Onychomycosis 09/24/2016  . Midfoot skin ulcer, right, limited to  breakdown of skin ([place]) 09/24/2016  . Testicular mass 04/18/2016  . Thrush 04/18/2016  . Soft tissue swelling   . Osteomyelitis ([place]) 01/29/2016  . Diabetic polyneuropathy associated with type 2 diabetes mellitus ([place])   . Type 2 diabetes mellitus with neurologic complication, with long-term current use of insulin ([place]) 12/08/2015  . Left shoulder pain 08/10/2015  . Pulmonary nodule 02/01/2015  . Screening for colon cancer 01/09/2015  . Lumbar back pain 03/21/2014  . Screening for STD (sexually transmitted disease) 12/15/2013  . Depression 12/09/2013  . Lightheadedness 11/19/2013  . Warts, genital 08/20/2013  . 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 Right leg pain 04/27/2013  . Diabetic retinopathy ([place]) 01/28/2013  . Visit for well man health check 11/19/2010  . Sleep apnea 09/06/2010  . 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 LEG EDEMA 03/03/2009  . Essential hypertension 11/09/2008  . COPD, mild ([place]) 10/06/2006  . SICKLE-CELL TRAIT 04/26/2006  . ERECTILE DYSFUNCTION 04/26/2006  . Tobacco abuse 04/26/2006  . Peripheral vascular disease ([place]) 04/26/2006  . ALLERGIC RHINITIS 04/26/2006   Past Medical History:  Diagnosis Date  . Allergy   . Arthritis   . Bronchitis   . Cataract   . Chronic kidney disease   . Claudication [facility])    right foot ray resection  . Colon polyps    hyperplastic  . COPD (chronic obstructive pulmonary disease) ([place])   . Diabetes mellitus   . Genital warts   . Gout   . Hyperlipidemia   . Hypertension     Family History  Problem Relation Age of Onset  . Diabetes Mother   . Stroke Mother   . Heart failure Father   . Colon cancer Neg Hx   . Esophageal cancer Neg Hx   . Rectal cancer Neg Hx   . Stomach cancer Neg Hx     Past Surgical History:  Procedure Laterality Date  . AMPUTATION Right 01/31/2016   Procedure: Right 2nd Toe Amputation;  Surgeon: Newt Minion, MD;  Location: [facility];  Service: Orthopedics;   Laterality: Right;  . CATARACT EXTRACTION     right eye  . COLONOSCOPY    . I&D EXTREMITY  04/11/2012   Procedure: IRRIGATION AND DEBRIDEMENT EXTREMITY;  Surgeon: Wylene Simmer, MD;  Location: [facility];  Service: Orthopedics;  Laterality: Right;  . Surgery left great toe    . Tear ducts bilateral eyes     Social History   Occupational History  . disabled    Social History Main Topics  . Smoking status: Former Smoker    Packs/day: 0.30    Years: 48.00    Types: Cigars    Start date: 07/02/1963  . Smokeless tobacco: Former Systems developer     Comment: wants to use electric cigarettes.  not interested in pills, worried about chantix side effects  . Alcohol use 0.0 oz/week  Comment: occassional use  . Drug use: No  . Sexual activity: Not on file

## 2016-11-18 ENCOUNTER — Ambulatory Visit: Payer: Medicare HMO | Admitting: Family Medicine

## 2016-11-19 ENCOUNTER — Encounter: Payer: Self-pay | Admitting: Family Medicine

## 2016-11-19 ENCOUNTER — Ambulatory Visit (INDEPENDENT_AMBULATORY_CARE_PROVIDER_SITE_OTHER): Payer: Medicare HMO | Admitting: Family Medicine

## 2016-11-19 ENCOUNTER — Ambulatory Visit: Payer: Medicare HMO | Admitting: Family Medicine

## 2016-11-19 VITALS — BP 126/74 | HR 63 | Temp 98.2°F | Ht 71.0 in | Wt 177.6 lb

## 2016-11-19 DIAGNOSIS — Z794 Long term (current) use of insulin: Secondary | ICD-10-CM | POA: Diagnosis not present

## 2016-11-19 DIAGNOSIS — R609 Edema, unspecified: Secondary | ICD-10-CM | POA: Diagnosis not present

## 2016-11-19 DIAGNOSIS — E1140 Type 2 diabetes mellitus with diabetic neuropathy, unspecified: Secondary | ICD-10-CM

## 2016-11-19 LAB — POCT GLYCOSYLATED HEMOGLOBIN (HGB A1C): Hemoglobin A1C: 8.3

## 2016-11-19 NOTE — Progress Notes (Signed)
   Subjective: CC: RLE swelling PPJ:KDTO L Adam Ray is a 61 y.o. male presenting to clinic today for same day appointment. PCP: [name], MD Concerns today include:  1. RLE swelling Patient seen at [facility] on 11/11/16 for RLE swelling.  He was put on doxycycline for concern for possible cellulitis of limb.  He followed up with his orthopedic office on 5/17.  His swelling at that time was thought to be more secondary to chronic venous stasis.  He reports that swelling has been ongoing for months now.  He wonders why he keeps having this happen.  He denies SOB, CP, DOE, exercise intolerance, orthopnea.  He notes he wears compression hose intermittently but that he does not think that this helps much.  He report social ETOH use and daily tobacco use.  He drinks diet sodas occasionally and canned goods frequently.  He reports elevating his LEs when he can at home.  He denies pain, fevers, chills, exudate from LE.  No Known Allergies  Social Hx reviewed: active smoker. MedHx, current medications and allergies reviewed.  Please see EMR. ROS: Per HPI  Objective: Office vital signs reviewed. BP 126/74   Pulse 63   Temp 98.2 F (36.8 C) (Oral)   Ht 5\' 11"  (1.803 m)   Wt 177 lb 9.6 oz (80.6 kg)   SpO2 96%   BMI 24.77 kg/m   Physical Examination:  General: Awake, alert, thin, No acute distress, accompanied to appt by wife. HEENT: Normal, no JVD Cardio: regular rate and rhythm, S1S2 heard, no murmurs appreciated Pulm: clear to auscultation bilaterally, no wheezes, rhonchi or rales; normal work of breathing on room air Extremities: warm, bilateral 3+ pitting edema to mid shins.  RLE>LLE.  He has surgically absent digits on the RLE.  He has a Nitro patch in place on the dorsum of the right foot.  Pedal pulses extremely difficult to palpate.  Foot is normal warmth.  Rocker bottom foot appreciated. Skin: hyperpigmentation of skin from mid shin down consistent with venous stasis changes.  No  erythema appreciated. Neuro: light touch grossly in tact  Assessment/ Plan: 61 y.o. male   1. Edema, unspecified type.  This is chronic for patient.  He has diabetic neuropathy and poor blood flow to foot that is chronic for him.  He is followed by orthopedics.  I counseled him extensively on tobacco cessation and salt restriction.  I also advised that he wear tight fitting compression hose daily.  He was wrapped in an ACE bandage prior to discharge from office in efforts to aid reduced edema in LE. - Encouraged him to follow up with PCP or ortho if swelling worsens or becomes painful.   2. Type 2 diabetes mellitus with diabetic neuropathy, with long-term current use of insulin (H[facility]) - HgB A1c - To follow up with PCP for DM2 management  Janora Norlander, DO PGY-3, [facility]

## 2016-11-19 NOTE — Patient Instructions (Signed)
Low-Sodium Eating Plan Sodium, which is an element that makes up salt, helps you maintain a healthy balance of fluids in your body. Too much sodium can increase your blood pressure and cause fluid and waste to be held in your body. Your health care provider or dietitian may recommend following this plan if you have high blood pressure (hypertension), kidney disease, liver disease, or heart failure. Eating less sodium can help lower your blood pressure, reduce swelling, and protect your heart, liver, and kidneys. What are tips for following this plan? General guidelines   Most people on this plan should limit their sodium intake to 1,500-2,000 mg (milligrams) of sodium each day. Reading food labels   The Nutrition Facts label lists the amount of sodium in one serving of the food. If you eat more than one serving, you must multiply the listed amount of sodium by the number of servings.  Choose foods with less than 140 mg of sodium per serving.  Avoid foods with 300 mg of sodium or more per serving. Shopping   Look for lower-sodium products, often labeled as "low-sodium" or "no salt added."  Always check the sodium content even if foods are labeled as "unsalted" or "no salt added".  Buy fresh foods.  Avoid canned foods and premade or frozen meals.  Avoid canned, cured, or processed meats  Buy breads that have less than 80 mg of sodium per slice. Cooking   Eat more home-cooked food and less restaurant, buffet, and fast food.  Avoid adding salt when cooking. Use salt-free seasonings or herbs instead of table salt or sea salt. Check with your health care provider or pharmacist before using salt substitutes.  Cook with plant-based oils, such as canola, sunflower, or olive oil. Meal planning   When eating at a restaurant, ask that your food be prepared with less salt or no salt, if possible.  Avoid foods that contain MSG (monosodium glutamate). MSG is sometimes added to [place] food,  bouillon, and some canned foods. What foods are recommended? The items listed may not be a complete list. Talk with your dietitian about what dietary choices are best for you. Grains  Low-sodium cereals, including oats, puffed wheat and rice, and shredded wheat. Low-sodium crackers. Unsalted rice. Unsalted pasta. Low-sodium bread. Whole-grain breads and whole-grain pasta. Vegetables  Fresh or frozen vegetables. "No salt added" canned vegetables. "No salt added" tomato sauce and paste. Low-sodium or reduced-sodium tomato and vegetable juice. Fruits  Fresh, frozen, or canned fruit. Fruit juice. Meats and other protein foods  Fresh or frozen (no salt added) meat, poultry, seafood, and fish. Low-sodium canned tuna and salmon. Unsalted nuts. Dried peas, beans, and lentils without added salt. Unsalted canned beans. Eggs. Unsalted nut butters. Dairy  Milk. Soy milk. Cheese that is naturally low in sodium, such as ricotta cheese, fresh mozzarella, or Swiss cheese Low-sodium or reduced-sodium cheese. Cream cheese. Yogurt. Fats and oils  Unsalted butter. Unsalted margarine with no trans fat. Vegetable oils such as canola or olive oils. Seasonings and other foods  Fresh and dried herbs and spices. Salt-free seasonings. Low-sodium mustard and ketchup. Sodium-free salad dressing. Sodium-free light mayonnaise. Fresh or refrigerated horseradish. Lemon juice. Vinegar. Homemade, reduced-sodium, or low-sodium soups. Unsalted popcorn and pretzels. Low-salt or salt-free chips. What foods are not recommended? The items listed may not be a complete list. Talk with your dietitian about what dietary choices are best for you. Grains  Instant hot cereals. Bread stuffing, pancake, and biscuit mixes. Croutons. Seasoned rice or pasta  mixes. Noodle soup cups. Boxed or frozen macaroni and cheese. Regular salted crackers. Self-rising flour. Vegetables  Sauerkraut, pickled vegetables, and relishes. Olives. [place] fries. Onion  rings. Regular canned vegetables (not low-sodium or reduced-sodium). Regular canned tomato sauce and paste (not low-sodium or reduced-sodium). Regular tomato and vegetable juice (not low-sodium or reduced-sodium). Frozen vegetables in sauces. Meats and other protein foods  Meat or fish that is salted, canned, smoked, spiced, or pickled. Bacon, ham, sausage, hotdogs, corned beef, chipped beef, packaged lunch meats, salt pork, jerky, pickled herring, anchovies, regular canned tuna, sardines, salted nuts. Dairy  Processed cheese and cheese spreads. Cheese curds. Blue cheese. Feta cheese. String cheese. Regular cottage cheese. Buttermilk. Canned milk. Fats and oils  Salted butter. Regular margarine. Ghee. Bacon fat. Seasonings and other foods  Onion salt, garlic salt, seasoned salt, table salt, and sea salt. Canned and packaged gravies. Worcestershire sauce. Tartar sauce. Barbecue sauce. Teriyaki sauce. Soy sauce, including reduced-sodium. Steak sauce. Fish sauce. Oyster sauce. Cocktail sauce. Horseradish that you find on the shelf. Regular ketchup and mustard. Meat flavorings and tenderizers. Bouillon cubes. Hot sauce and Tabasco sauce. Premade or packaged marinades. Premade or packaged taco seasonings. Relishes. Regular salad dressings. Salsa. Potato and tortilla chips. Corn chips and puffs. Salted popcorn and pretzels. Canned or dried soups. Pizza. Frozen entrees and pot pies. Summary  Eating less sodium can help lower your blood pressure, reduce swelling, and protect your heart, liver, and kidneys.  Most people on this plan should limit their sodium intake to 1,500-2,000 mg (milligrams) of sodium each day.  Canned, boxed, and frozen foods are high in sodium. Restaurant foods, fast foods, and pizza are also very high in sodium. You also get sodium by adding salt to food.  Try to cook at home, eat more fresh fruits and vegetables, and eat less fast food, canned, processed, or prepared foods. This  information is not intended to replace advice given to you by your health care provider. Make sure you discuss any questions you have with your health care provider. Document Released: 12/07/2001 Document Revised: 06/10/2016 Document Reviewed: 06/10/2016 Elsevier Interactive Patient Education  2017 Elsevier Inc.   Chronic Venous Insufficiency Chronic venous insufficiency, also called venous stasis, is a condition that prevents blood from being pumped effectively through the veins in your legs. Blood may no longer be pumped effectively from the legs back to the heart. This condition can range from mild to severe. With proper treatment, you should be able to continue with an active life. What are the causes? Chronic venous insufficiency occurs when the vein walls become stretched, weakened, or damaged, or when valves within the vein are damaged. Some common causes of this include:  High blood pressure inside the veins (venous hypertension).  Increased blood pressure in the leg veins from long periods of sitting or standing.  A blood clot that blocks blood flow in a vein (deep vein thrombosis, DVT).  Inflammation of a vein (phlebitis) that causes a blood clot to form.  Tumors in the pelvis that cause blood to back up. What increases the risk? The following factors may make you more likely to develop this condition:  Having a family history of this condition.  Obesity.  Pregnancy.  Living without enough physical activity or exercise (sedentary lifestyle).  Smoking.  Having a job that requires long periods of standing or sitting in one place.  Being a certain age. Women in their 58s and 90s and men in their 83s are more likely to develop  this condition. What are the signs or symptoms? Symptoms of this condition include:  Veins that are enlarged, bulging, or twisted (varicose veins).  Skin breakdown or ulcers.  Reddened or discolored skin on the front of the leg.  Brown,  smooth, tight, and painful skin just above the ankle, usually on the inside of the leg (lipodermatosclerosis).  Swelling. How is this diagnosed? This condition may be diagnosed based on:  Your medical history.  A physical exam.  Tests, such as:  A procedure that creates an image of a blood vessel and nearby organs and provides information about blood flow through the blood vessel (duplex ultrasound).  A procedure that tests blood flow (plethysmography).  A procedure to look at the veins using X-ray and dye (venogram). How is this treated? The goals of treatment are to help you return to an active life and to minimize pain or disability. Treatment depends on the severity of your condition, and it may include:  Wearing compression stockings. These can help relieve symptoms and help prevent your condition from getting worse. However, they do not cure the condition.  Sclerotherapy. This is a procedure involving an injection of a material that "dissolves" damaged veins.  Surgery. This may involve:  Removing a diseased vein (vein stripping).  Cutting off blood flow through the vein (laser ablation surgery).  Repairing a valve. Follow these instructions at home:  Wear compression stockings as told by your health care provider. These stockings help to prevent blood clots and reduce swelling in your legs.  Take over-the-counter and prescription medicines only as told by your health care provider.  Stay active by exercising, walking, or doing different activities. Ask your health care provider what activities are safe for you and how much exercise you need.  Drink enough fluid to keep your urine clear or pale yellow.  Do not use any products that contain nicotine or tobacco, such as cigarettes and e-cigarettes. If you need help quitting, ask your health care provider.  Keep all follow-up visits as told by your health care provider. This is important. Contact a health care provider  if:  You have redness, swelling, or more pain in the affected area.  You see a red streak or line that extends up or down from the affected area.  You have skin breakdown or a loss of skin in the affected area, even if the breakdown is small.  You get an injury in the affected area. Get help right away if:  You get an injury and an open wound in the affected area.  You have severe pain that does not get better with medicine.  You have sudden numbness or weakness in the foot or ankle below the affected area, or you have trouble moving your foot or ankle.  You have a fever and you have worse or persistent symptoms.  You have chest pain.  You have shortness of breath. Summary  Chronic venous insufficiency, also called venous stasis, is a condition that prevents blood from being pumped effectively through the veins in your legs.  Chronic venous insufficiency occurs when the vein walls become stretched, weakened, or damaged, or when valves within the vein are damaged.  Treatment for this condition depends on how severe your condition is, and it may involve wearing compression stockings or having a procedure.  Make sure you stay active by exercising, walking, or doing different activities. Ask your health care provider what activities are safe for you and how much exercise you need. This  information is not intended to replace advice given to you by your health care provider. Make sure you discuss any questions you have with your health care provider. Document Released: 10/21/2006 Document Revised: 05/06/2016 Document Reviewed: 05/06/2016 Elsevier Interactive Patient Education  2017 [employer].

## 2016-11-28 ENCOUNTER — Encounter: Payer: Self-pay | Admitting: Family Medicine

## 2016-11-28 ENCOUNTER — Ambulatory Visit (INDEPENDENT_AMBULATORY_CARE_PROVIDER_SITE_OTHER): Payer: Medicare HMO | Admitting: Family Medicine

## 2016-11-28 ENCOUNTER — Telehealth: Payer: Self-pay

## 2016-11-28 VITALS — BP 128/66 | HR 61 | Temp 98.1°F | Ht 71.0 in | Wt 178.6 lb

## 2016-11-28 DIAGNOSIS — E1140 Type 2 diabetes mellitus with diabetic neuropathy, unspecified: Secondary | ICD-10-CM

## 2016-11-28 DIAGNOSIS — Z794 Long term (current) use of insulin: Secondary | ICD-10-CM

## 2016-11-28 NOTE — Telephone Encounter (Signed)
Pt would like Dr. [name] to order at CT of his chest. He would like to follow up from 06/2015 when something was spotted on his CT. "Just want to make sure nothing sneaks up on me".  Please let him know when this is scheduled. Ottis Stain, CMA

## 2016-11-28 NOTE — Progress Notes (Signed)
   Subjective: CC: elevated blood sugar [id number] Adam Ray is a 61 y.o. male presenting to clinic today for same day appointment. PCP: [name], MD Concerns today include:  1. Diabetes:  Is followed by Endocrinology at the [facility]. High at home: 412 Low at home: 3 Taking medications: Novolog 25 units QID. Brings BG monitor in. Hypoglycemic episodes erratic.  ROS: denies fever, chills, dizziness, LOC, polyuria, polydipsia, or chest pain. Endorses numbness or tingling in extremities Last eye exam: Today.  Was told he has a small bleed in his eye and is scheduled for follow up in June.  Wife disgruntled that he was not given eye drops for this. Last A1c: 10/2016 was 8.2 Nephropathy screen indicated?: on ACE-I Last flu, zoster and/or pneumovax: UTD  No Known Allergies  Social Hx reviewed: active smoker. MedHx, current medications and allergies reviewed.  Please see EMR. ROS: Per HPI  Objective: Office vital signs reviewed. BP 128/66   Pulse 61   Temp 98.1 F (36.7 C) (Oral)   Ht 5\' 11"  (1.803 m)   Wt 178 lb 9.6 oz (81 kg)   SpO2 96%   BMI 24.91 kg/m   Physical Examination:  General: Awake, alert, No acute distress, accompanied to visit by wife HEENT: MMM    Eyes: PERRLA, EOMI, sclera without injection, no ocular discharge  Cardio: regular rate and rhythm, S1S2 heard, no murmurs appreciated Pulm: clear to auscultation bilaterally, no wheezes, rhonchi or rales; normal work of breathing on room air Extremities: warm, bilateral 2-3+ pitting edema to mid shins.  RLE>LLE.  Surgically absent digits on the RLE.  Pedal pulses extremely difficult to palpate.  Foot is normal warmth.  Rocker bottom foot appreciated. Skin: hyperpigmentation of skin from mid shin down consistent with venous stasis changes.  No erythema appreciated.  Assessment/ Plan: 61 y.o. male   1. Type 2 diabetes mellitus with diabetic neuropathy, with long-term current use of insulin ([place]), uncontrolled.  Last  A1c 8.2.  Given lability of BGs, I asked patient and wife to keep a BG log and bring to Endocrinologist, PCP or myself for insulin titration.  I made no adjustments today, given his frequent hypoglycemic episodes.  Will check renal function today per wife's request.  Continue Novolog, ACE-I and statin. - Basic metabolic panel  Janora Norlander, DO PGY-3, [facility]

## 2016-11-28 NOTE — Patient Instructions (Signed)
Make sure to keep track of your blood sugars on the logs I have provided.  Schedule an appointment with your Endocrinologist at the [facility].  This is your sugar doctor.   I will contact you will the results of your labs.  If anything is abnormal, I will call you.  Otherwise, expect a copy to be mailed to you.

## 2016-11-29 ENCOUNTER — Ambulatory Visit: Payer: Medicare HMO | Admitting: Family Medicine

## 2016-11-29 ENCOUNTER — Encounter: Payer: Self-pay | Admitting: Family Medicine

## 2016-11-29 LAB — BASIC METABOLIC PANEL
BUN/Creatinine Ratio: 16 (ref 10–24)
BUN: 19 mg/dL (ref 8–27)
CO2: 24 mmol/L (ref 18–29)
CREATININE: 1.19 mg/dL (ref 0.76–1.27)
Calcium: 9 mg/dL (ref 8.6–10.2)
Chloride: 102 mmol/L (ref 96–106)
GFR, EST AFRICAN AMERICAN: 76 mL/min/{1.73_m2} (ref >59)
GFR, EST NON AFRICAN AMERICAN: 66 mL/min/{1.73_m2} (ref >59)
Glucose: 327 mg/dL — ABNORMAL HIGH (ref 65–99)
POTASSIUM: 4.6 mmol/L (ref 3.5–5.2)
Sodium: 140 mmol/L (ref 134–144)

## 2016-11-29 NOTE — Progress Notes (Signed)
Letter sent with results.  Kidney function was within normal limits.  Blood sugar elevated.  Please reemphasize that patient should follow up with his diabetes doctor at the [facility] in next week.

## 2016-12-03 ENCOUNTER — Telehealth: Payer: Self-pay | Admitting: Family Medicine

## 2016-12-03 ENCOUNTER — Other Ambulatory Visit: Payer: Self-pay | Admitting: Endocrinology

## 2016-12-03 DIAGNOSIS — Z711 Person with feared health complaint in whom no diagnosis is made: Secondary | ICD-10-CM

## 2016-12-03 NOTE — Telephone Encounter (Signed)
Please refill x 3 mos Ov is due 

## 2016-12-03 NOTE — Addendum Note (Signed)
Addended by: [name] on: 12/03/2016 04:33 PM   Modules accepted: Orders

## 2016-12-03 NOTE — Telephone Encounter (Signed)
Discussed with patient on the phone today that he needs an office visit to discuss this. The CT from 2016 did not suggest that he needs additional imaging. His insurance will not cover it without valid reason for the study.  [name], MD

## 2016-12-03 NOTE — Telephone Encounter (Signed)
Last seen 12/08/15, okay to refill?   Thank you!

## 2016-12-03 NOTE — Telephone Encounter (Signed)
Pt needs a referral for [facility] ASAP. Pt states he has blurry vision and his eyes are bleeding. Fax [phone]. Dr. Feliberto Gottron ep

## 2016-12-03 NOTE — Telephone Encounter (Signed)
Called patient to discuss. Please note that history I obtained over the phone was quite limited as he had difficulty giving me details on what his symptoms are or what he has done.  What I did understand from him is that last week week he started seeing "blinds" and one eye was bleeding. He saw his ophthalmologist last Tuesday. He does not know that physician's name. She told him he needs a new eye drop, which they have ordered but is not in stock yet. She wanted him to follow up this week or next. Since then, his vision is getting a little bit better.  He, however, wants to transfer to [facility]. He requests a referral. States his son has spoken to them and that he will be able to get an appointment as soon as the referral is placed. He is adamant that all he needs from me is a referral to [facility].  I stressed to him that it is essential that he follow up with his ophthalmologist in the mean time until he can get in with [facility].   The fax number they provided me with over the phone is [phone]. I advised I would put in a referral.   [name], MD

## 2016-12-03 NOTE — Telephone Encounter (Signed)
Patient called back again to tell me that the fax number is actually [phone]. He reports his ophthalmologist is through the [place]. I discussed with him that Dr. Feliberto Gottron is an optometrist and he needs an ophthalmologist. Will still enter referral to [facility] per his request, to see an ophthalmologist. [name], MD

## 2016-12-06 ENCOUNTER — Telehealth: Payer: Self-pay | Admitting: Family Medicine

## 2016-12-06 NOTE — Telephone Encounter (Signed)
Pt is calling to check the status of his referral to see a eye doctor. He called [facility] and they said that they have not received a fax from [place]. jw

## 2016-12-08 NOTE — Telephone Encounter (Signed)
It appears this was handled by Tia and referral was faxed on 6/7. Tia, can you contact them to see if they received it? Thanks [name], MD

## 2016-12-09 NOTE — Telephone Encounter (Signed)
I will have to re-fax referral tomorrow when back in office.
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
## 2016-12-11 NOTE — Telephone Encounter (Signed)
I re-faxed referral on 12/10/16.

## 2016-12-16 NOTE — Telephone Encounter (Signed)
I will contact their office tomorrow to verify the fax number, as I have faxed the referral twice to the number that patient supplied.

## 2016-12-16 NOTE — Telephone Encounter (Signed)
Pt says [facility] has not received the referral.  Please advise

## 2016-12-17 NOTE — Telephone Encounter (Addendum)
This is the third fax number given to [place] by the patient. I have faxed the referral again to this new fax number [phone].

## 2016-12-17 NOTE — Telephone Encounter (Signed)
Pt states [facility] has not received the fax. Pt wants it faxed to [phone] Attention Melissa and pt wants someone to call him after fax has been sent in. ep

## 2016-12-18 ENCOUNTER — Ambulatory Visit (INDEPENDENT_AMBULATORY_CARE_PROVIDER_SITE_OTHER): Payer: Medicare HMO | Admitting: Family Medicine

## 2016-12-18 ENCOUNTER — Other Ambulatory Visit (INDEPENDENT_AMBULATORY_CARE_PROVIDER_SITE_OTHER): Payer: Self-pay | Admitting: Radiology

## 2016-12-18 ENCOUNTER — Encounter: Payer: Self-pay | Admitting: Family Medicine

## 2016-12-18 VITALS — BP 125/80 | HR 68 | Temp 97.7°F | Ht 71.0 in | Wt 183.6 lb

## 2016-12-18 DIAGNOSIS — T148XXA Other injury of unspecified body region, initial encounter: Secondary | ICD-10-CM | POA: Diagnosis not present

## 2016-12-18 MED ORDER — MELOXICAM 15 MG PO TABS: 15.0000 mg | ORAL_TABLET | Freq: Every day | ORAL | 0 refills | Status: DC

## 2016-12-18 MED ORDER — BACLOFEN 10 MG PO TABS: 10.0000 mg | ORAL_TABLET | Freq: Three times a day (TID) | ORAL | 0 refills | Status: DC | PRN

## 2016-12-18 MED ORDER — NITROGLYCERIN 0.2 MG/HR TD PT24: 0.2000 mg | MEDICATED_PATCH | Freq: Every day | TRANSDERMAL | 2 refills | Status: DC

## 2016-12-18 NOTE — Patient Instructions (Signed)
I suspect the pains in your back are due to muscle strains. I have refilled your muscle relaxer (Baclofen). I have also sent a prescription for Mobic to the pharmacy to take once a day. Do not use Advil or Aleve with this medicine as it is in the same family. If you need additional pain medicine to take with Mobic, I recommend Tylenol or Salonpas patches. Please follow up if no improvement.  Dr. Gerlean Ren

## 2016-12-23 ENCOUNTER — Ambulatory Visit (INDEPENDENT_AMBULATORY_CARE_PROVIDER_SITE_OTHER): Payer: Medicare HMO | Admitting: Orthopedic Surgery

## 2016-12-24 NOTE — Progress Notes (Signed)
Subjective:     Patient ID: Adam Ray, male   DOB: 1955/10/07, 61 y.o.   MRN: [id number]  HPI Adam Ray is a 61yo male presenting today for pain. Reports right upper back and left lower back pain. Right upper back pain noted Monday and Tuesday. Left lower back pain started yesterday. First noted pain after working in his garden. Denies history of trauma. Has not been taking any medications for his pain. Has taken Baclofen with some relief in the past. Denies numbness, tingling, weakness. Denies urinary incontinence/retention and fecal incontinence. Denies saddle anesthesia. Has history of normal right shoulder xray in 2016 and normal lower back xray in 2014.   Review of Systems Per HPI    Objective:   Physical Exam  Constitutional: He appears well-developed and well-nourished. No distress.  Cardiovascular: Normal rate and regular rhythm.   No murmur heard. Pulmonary/Chest: Effort normal. No respiratory distress. He has no wheezes.  Musculoskeletal:  Tenderness over right thoracic paraspinal muscles and right scapula as well as left lumbar paraspinal muscles. No midline spinal tenderness. No tenderness over left greater trochanter. Hip ROM full and symmetric. Negative straight leg raise. Muscle strength 5/5 in upper and lower extremities.   Neurological: He has normal reflexes.  Sensation intact  Psychiatric: He has a normal mood and affect. His behavior is normal.       Assessment and Plan:     1. Muscle strain Suspect muscle strain in upper and lower back following gardening. Baclofen refilled. Prescription for Mobic sent to pharmacy. May take Tylenol or use Salonpas patches for breakthrough pain. Refuses injection today. Follow up if no improvement.

## 2016-12-26 ENCOUNTER — Telehealth: Payer: Self-pay | Admitting: Family Medicine

## 2016-12-26 NOTE — Telephone Encounter (Signed)
Pt and wife were on speaker phone. Pt wanted to be with wife on July 6th, but PCP does not have anymore openings. They would like to know if PCP is willing to talk to pt during wife's visit about something (they wouldn't say what) and said it needed to be discussed face to face. Let pt know if this is an option. ep

## 2016-12-27 ENCOUNTER — Ambulatory Visit: Payer: Medicare HMO | Admitting: Student

## 2016-12-27 ENCOUNTER — Telehealth: Payer: Self-pay | Admitting: Family Medicine

## 2016-12-27 NOTE — Telephone Encounter (Signed)
Called patient, no answer, no voicemail

## 2016-12-27 NOTE — Telephone Encounter (Signed)
It depends on how long [name]'s visit goes. If there is time, I may be able to briefly speak with Eoin about his concern, but it will need to be brief. Please let him know.  Thanks [name], MD

## 2016-12-27 NOTE — Telephone Encounter (Signed)
Pt would like PCP to call him as soon as she can. Pt did not say why. ep

## 2016-12-30 DIAGNOSIS — H52223 Regular astigmatism, bilateral: Secondary | ICD-10-CM | POA: Diagnosis not present

## 2016-12-30 DIAGNOSIS — H524 Presbyopia: Secondary | ICD-10-CM | POA: Diagnosis not present

## 2016-12-30 DIAGNOSIS — E113512 Type 2 diabetes mellitus with proliferative diabetic retinopathy with macular edema, left eye: Secondary | ICD-10-CM | POA: Diagnosis not present

## 2016-12-30 DIAGNOSIS — H5203 Hypermetropia, bilateral: Secondary | ICD-10-CM | POA: Diagnosis not present

## 2016-12-30 DIAGNOSIS — H2512 Age-related nuclear cataract, left eye: Secondary | ICD-10-CM | POA: Diagnosis not present

## 2016-12-31 NOTE — Telephone Encounter (Signed)
Called again, no answer, no voicemail. 

## 2017-01-06 ENCOUNTER — Telehealth: Payer: Self-pay | Admitting: Family Medicine

## 2017-01-06 NOTE — Telephone Encounter (Signed)
Pt wants to tale to dr [name] ASAP.  Says it is about his life insurance needing medical records.

## 2017-01-07 NOTE — Telephone Encounter (Signed)
Returned call to patient. Apparently his life insurance company sent a request last week for his medical records to be sent to them. I advised pt that I don't have anything to do with medical records release but that I would send a message to our admin team to look into the request and update him on its status.  Admin team, can you check to see what the status is of this request and call patient with information?  Thanks [name], MD

## 2017-01-07 NOTE — Telephone Encounter (Signed)
Pt is calling to speak to Dr. [name] about his life insurance. He said that this was important. jw

## 2017-01-14 ENCOUNTER — Telehealth: Payer: Self-pay | Admitting: *Deleted

## 2017-01-14 MED ORDER — MELOXICAM 15 MG PO TABS: 15.0000 mg | ORAL_TABLET | Freq: Every day | ORAL | 0 refills | Status: DC

## 2017-01-14 NOTE — Telephone Encounter (Signed)
Jessica, There was no note attached to this phone call. It looks like it was a meloxicam refill request. I've sent it to his pharmacy. Was that all? Thanks [name], MD

## 2017-01-15 NOTE — Telephone Encounter (Signed)
Yes that was all.  I must have done a telephone encounter instead of refill. Thanks!  Macalister Arnaud, Salome Spotted, CMA

## 2017-01-29 ENCOUNTER — Ambulatory Visit (INDEPENDENT_AMBULATORY_CARE_PROVIDER_SITE_OTHER): Payer: Medicare HMO | Admitting: Internal Medicine

## 2017-01-29 ENCOUNTER — Encounter: Payer: Self-pay | Admitting: Internal Medicine

## 2017-01-29 VITALS — BP 110/72 | HR 57 | Temp 98.5°F | Ht 70.0 in | Wt 187.2 lb

## 2017-01-29 DIAGNOSIS — Z5181 Encounter for therapeutic drug level monitoring: Secondary | ICD-10-CM | POA: Diagnosis not present

## 2017-01-29 DIAGNOSIS — M7989 Other specified soft tissue disorders: Secondary | ICD-10-CM | POA: Insufficient documentation

## 2017-01-29 MED ORDER — FUROSEMIDE 40 MG PO TABS: 40.0000 mg | ORAL_TABLET | Freq: Every day | ORAL | 0 refills | Status: DC

## 2017-01-29 NOTE — Assessment & Plan Note (Addendum)
-   Chronic, worsening. No evidence of infection. Lungs CTAB and no murmurs appreciated, so low suspicion for CHF. Suspect chronic foot deformities contributing. - Will trial increased dose of lasix, from 20 mg to 40 mg. Will check CMP for baseline electrolytes and assess for hypoalbuminemia. Cautioned patient to stop increased dose if becomes light-headed or dizzy, as BP is on the lower side. - Counseled patient to continue wearing compression stockings, elevate legs as able throughout the day, and to avoid salty foods. - He is to see his orthopedic surgeon Dr. Sharol Given later this week. He plans to contact [facility] for nail cutting.

## 2017-01-29 NOTE — Progress Notes (Signed)
[facility] Progress Note  Subjective:  Adam Ray is a 61 y.o. 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  #Leg swelling: - Has been worse for the last 4 months - Not necessarily painful. Complains of pain in his toes but reports needing his nails trimmed. - Has been wearing compression hose that he reports are 30 mmHg. Says he has not noticed any improvement despite wearing them all day and only taking off at night. - Denies eating salty foods or canned goods. Does eat chips but says hasn't had any for the last 2 weeks and hasn't noticed a difference.  - Says lasix 20 mg does make him urinate frequently (10-15 times a day) but not necessarily much ROS: No fevers, chills, no rash, no SOB, no CP  Social: Smoking 4 cigarettes a day; not interested in quitting at this time  No Known Allergies  Objective: Blood pressure 110/72, pulse (!) 57, temperature 98.5 F (36.9 C), temperature source Oral, height 5\' 10"  (1.778 m), weight 187 lb 3.2 oz (84.9 kg), SpO2 97 %. Body mass index is 26.86 kg/m. Constitutional: Overweight male in NAD HENT: MMM Cardiovascular: RRR, S1, S2, no m/r/g.  Pulmonary/Chest: Effort normal and breath sounds normal. No respiratory distress.  Musculoskeletal: 2+ pitting edema to just below knee on R and to mid-tibia on L. Clean amputation sites on R foot with thickened toenails.  Neurological: No sensation to monofilament testing over plantar aspect of R foot; sensation intact over L foot. Clean bandage in place over previously debrided ulcer of medial plantar aspect of R foot.  Skin: Skin is warm and dry. No rash noted. No erythema.  Psychiatric: Normal mood and affect.  Vitals reviewed  LVEF 55% as estimated from [place] 12/2010  Assessment/Plan: Leg swelling - Chronic, worsening. No evidence of infection. Lungs CTAB and no murmurs appreciated, so low suspicion for CHF. Suspect  chronic foot deformities contributing. - Will trial increased dose of lasix, from 20 mg to 40 mg. Will check CMP for baseline electrolytes and assess for hypoalbuminemia. Cautioned patient to stop increased dose if becomes light-headed or dizzy, as BP is on the lower side. - Counseled patient to continue wearing compression stockings, elevate legs as able throughout the day, and to avoid salty foods. - He is to see his orthopedic surgeon Dr. Sharol Given later this week. He plans to contact [facility] for nail cutting.    Follow-up in 1-2 weeks to assess symptoms and repeat labs.  Olene Floss, MD [place], PGY-3

## 2017-01-29 NOTE — Patient Instructions (Signed)
Adam Ray,  I am increasing your lasix to 40 mg daily. Please return in 1-2 weeks to see if you have improvement in symptoms and to recheck labs.  Please call if you need referral to Podiatry for nail clipping.  Best, Dr. Ola Spurr

## 2017-01-30 LAB — COMPREHENSIVE METABOLIC PANEL
A/G RATIO: 1.7 (ref 1.2–2.2)
ALBUMIN: 4 g/dL (ref 3.6–4.8)
ALT: 23 IU/L (ref 0–44)
AST: 18 IU/L (ref 0–40)
Alkaline Phosphatase: 101 IU/L (ref 39–117)
BUN / CREAT RATIO: 13 (ref 10–24)
BUN: 16 mg/dL (ref 8–27)
CALCIUM: 9 mg/dL (ref 8.6–10.2)
CO2: 24 mmol/L (ref 20–29)
Chloride: 102 mmol/L (ref 96–106)
Creatinine, Ser: 1.27 mg/dL (ref 0.76–1.27)
GFR, EST AFRICAN AMERICAN: 70 mL/min/{1.73_m2} (ref >59)
GFR, EST NON AFRICAN AMERICAN: 61 mL/min/{1.73_m2} (ref >59)
GLOBULIN, TOTAL: 2.3 g/dL (ref 1.5–4.5)
Glucose: 148 mg/dL — ABNORMAL HIGH (ref 65–99)
POTASSIUM: 4.7 mmol/L (ref 3.5–5.2)
SODIUM: 139 mmol/L (ref 134–144)
TOTAL PROTEIN: 6.3 g/dL (ref 6.0–8.5)

## 2017-02-03 ENCOUNTER — Encounter: Payer: Self-pay | Admitting: Family Medicine

## 2017-02-03 ENCOUNTER — Ambulatory Visit (INDEPENDENT_AMBULATORY_CARE_PROVIDER_SITE_OTHER): Payer: Medicare HMO | Admitting: Family Medicine

## 2017-02-03 VITALS — BP 134/78 | HR 71 | Temp 98.7°F | Ht 70.0 in | Wt 182.0 lb

## 2017-02-03 DIAGNOSIS — A630 Anogenital (venereal) warts: Secondary | ICD-10-CM

## 2017-02-03 DIAGNOSIS — M7989 Other specified soft tissue disorders: Secondary | ICD-10-CM

## 2017-02-03 NOTE — Assessment & Plan Note (Signed)
Patient was concerned he has active genital wart breakout. Inspected the genital region including penis and scrotum and saw no evidence of genital warts at this time.

## 2017-02-03 NOTE — Patient Instructions (Addendum)
It was great to meet you today! Thank you for letting me participate in your care!  Today, we discussed safe sex practices. If you have any further concerns please return to the office.  Be well, Harolyn Rutherford, DO PGY-1, [facility]

## 2017-02-03 NOTE — Assessment & Plan Note (Signed)
Patient reports he has improved leg edema with the increase in Lasix. Denies any episodes of syncope or near syncope.   Counseled patient to find some compression stockings or socks he can wear to help improve leg edema and wear them everyday. [facility] to see if they have a 77mmHg stocking. If they do not patient agreed to go to the store and buy some medium compression socks.

## 2017-02-03 NOTE — Progress Notes (Signed)
     Subjective: No chief complaint on file.    HPI: Adam Ray is a 61 y.o. presenting to clinic today to discuss the following:  1 Determine if he has an active breakout of genital warts. 2 Check on his leg swelling  Patient is here today for Determine if he has an active breakout of genital warts and discuss safe sex practices that limit the risk of transmission of genital warts. He states he doesn't think he has any but wants a doctor to look and make sure.  He also states he cannot wear his 68mmHg stocking he has gotten from the [place]. He states his leg swelling has improved since increasing the Lasix last week but he still has swollen legs.  Patient denies SOB, difficulty breathing, cough, chest pain, dizziness, syncope, or near syncope.  Health Maintenance: none today     ROS noted in HPI.   Past Medical, Surgical, Social, and Family History Reviewed & Updated per EMR.   Pertinent Historical Findings include:   History  Smoking Status  . Former Smoker  . Packs/day: 0.30  . Years: 48.00  . Types: Cigars  . Start date: 07/02/1963  Smokeless Tobacco  . Former Systems developer    Comment: wants to use electric cigarettes.  not interested in pills, worried about chantix side effects      Objective: BP 134/78   Pulse 71   Temp 98.7 F (37.1 C) (Oral)   Ht 5\' 10"  (1.778 m)   Wt 182 lb (82.6 kg)   BMI 26.11 kg/m  Vitals and nursing notes reviewed  Physical Exam  Gen: Alert and Oriented x 3, NAD HEENT: Normocephalic, atraumatic, PERRLA, EOMI Resp: CTAB, no wheezing, rales, or rhonchi, comfortable work of breathing CV: RRR, no murmurs, normal S1, S2 split Ext: no clubbing, cyanosis, +2 pitting edema bilaterally below the knees Skin: warm, dry, intact, no rashes   No results found for this or any previous visit (from the past 72 hour(s)).  Assessment/Plan:  Warts, genital Patient was concerned he has active genital wart breakout. Inspected the genital region including  penis and scrotum and saw no evidence of genital warts at this time.  Leg swelling Patient reports he has improved leg edema with the increase in Lasix. Denies any episodes of syncope or near syncope.   Counseled patient to find some compression stockings or socks he can wear to help improve leg edema and wear them everyday. [facility] to see if they have a 9mmHg stocking. If they do not patient agreed to go to the store and buy some medium compression socks.    Please see problem based Assessment and Plan PATIENT EDUCATION PROVIDED: See AVS    Diagnosis and plan along with any newly prescribed medication(s) were discussed in detail with this patient today. The patient verbalized understanding and agreed with the plan. Patient advised if symptoms worsen return to clinic or ER.   Health Maintainance:   No orders of the defined types were placed in this encounter.   No orders of the defined types were placed in this encounter.    Harolyn Rutherford, DO 02/03/2017, 10:00 AM PGY-1, [place]

## 2017-02-05 ENCOUNTER — Ambulatory Visit: Payer: Medicare HMO | Admitting: Internal Medicine

## 2017-02-07 ENCOUNTER — Ambulatory Visit (INDEPENDENT_AMBULATORY_CARE_PROVIDER_SITE_OTHER): Payer: Medicare HMO | Admitting: Family Medicine

## 2017-02-07 ENCOUNTER — Encounter: Payer: Self-pay | Admitting: Family Medicine

## 2017-02-07 VITALS — BP 130/78 | HR 73 | Temp 98.1°F | Wt 176.6 lb

## 2017-02-07 DIAGNOSIS — M7989 Other specified soft tissue disorders: Secondary | ICD-10-CM

## 2017-02-07 NOTE — Patient Instructions (Addendum)
It was nice meeting you!  You were seen for follow up on your leg swelling.  You can continue to take 40 mg Lasix daily as this dose seems to be better for you.  In addition, continue to elevate your legs and feet at home and use the compression stockings to help reduce some of this.   I have repeated your labs today as well to make sure your kidney function is good with your increase in Lasix dose.  I will call you with the results and let you know.   Be well,  Lovenia Kim, MD

## 2017-02-07 NOTE — Progress Notes (Signed)
   Subjective:   Patient ID: Adam Ray    DOB: 06/19/1956, 61 y.o. male   MRN: [id number]  CC: f/u leg swelling   HPI: Adam Ray is a 62 y.o. male who presents to clinic today for follow up on leg swelling.  Leg swelling -Swelling in both feet x 4 months.  Recently saw Dr. Ola Spurr on 8/1 and Lasix increased from 20 mg to 40 mg daily.  Reports good compliance.  Reports UOP is significantly increased on this dose.  -Pain has somewhat improved due to improvement in swelling.  Denies numbness, tingling.  -Does not eat salty/canned foods   -Reports some cramping in legs at nighttime but overall less pain as swelling has decreased  -using 30 mmHg compression stockings he got from the [place].  Has also been elevating legs at home.   ROS: Denies CP, palpitations, SOB.  [place]: Pertinent past medical, surgical, family, and social history were reviewed and updated as appropriate. Smoking status reviewed. Medications reviewed.  Objective:   BP 130/78   Pulse 73   Temp 98.1 F (36.7 C) (Oral)   Wt 176 lb 9.6 oz (80.1 kg)   SpO2 96%   BMI 25.34 kg/m  Vitals and nursing note reviewed.  General: 61 yo male, no acute distress  CV: RRR no MRG  Lungs: effort normal, no increased work of breathing  MSK: 2+ pitting edema to below the knee bilaterally.   Skin: warm, dry, no rashes or lesions  Neuro: decreased sensation over plantar aspect of right foot.  Sensation intact over left foot.  Strength is 5/5 bilaterally in LE.   Assessment & Plan:   Leg swelling Chronic, improving with increased Lasix dose. Recommend continuing 40 mg daily.  -Advised pt to elevate legs , continue to use compression stockings and avoid high salt intake in diet -pt to contact [place] for nail cutting -Repeat BMET today to check SCr with increased dose Lasix  -Follow up with PCP in 3 weeks to reassess   Orders Placed This Encounter  Procedures  . Basic Metabolic Panel   Lovenia Kim, MD [place], PGY-1 02/12/2017 5:41 PM

## 2017-02-08 LAB — BASIC METABOLIC PANEL
BUN/Creatinine Ratio: 14 (ref 10–24)
BUN: 19 mg/dL (ref 8–27)
CALCIUM: 9.2 mg/dL (ref 8.6–10.2)
CO2: 24 mmol/L (ref 20–29)
CREATININE: 1.39 mg/dL — AB (ref 0.76–1.27)
Chloride: 98 mmol/L (ref 96–106)
GFR, EST AFRICAN AMERICAN: 63 mL/min/{1.73_m2} (ref >59)
GFR, EST NON AFRICAN AMERICAN: 54 mL/min/{1.73_m2} — AB (ref >59)
Glucose: 278 mg/dL — ABNORMAL HIGH (ref 65–99)
POTASSIUM: 4.2 mmol/L (ref 3.5–5.2)
Sodium: 137 mmol/L (ref 134–144)

## 2017-02-10 DIAGNOSIS — R6889 Other general symptoms and signs: Secondary | ICD-10-CM | POA: Diagnosis not present

## 2017-02-12 NOTE — Assessment & Plan Note (Signed)
Chronic, improving with increased Lasix dose. Recommend continuing 40 mg daily.  -Advised pt to elevate legs , continue to use compression stockings and avoid high salt intake in diet -pt to contact [facility] for nail cutting -Repeat BMET today to check SCr with increased dose Lasix  -Follow up with PCP in 3 weeks to reassess

## 2017-02-13 ENCOUNTER — Ambulatory Visit (INDEPENDENT_AMBULATORY_CARE_PROVIDER_SITE_OTHER): Payer: Medicare HMO | Admitting: Orthopedic Surgery

## 2017-02-13 DIAGNOSIS — B351 Tinea unguium: Secondary | ICD-10-CM | POA: Diagnosis not present

## 2017-02-13 NOTE — Progress Notes (Signed)
Office Visit Note   Patient: Adam Ray           Date of Birth: 1955-09-25           MRN: [id number] Visit Date: 02/13/2017              Requested by: [name], [place], [place] [address] PCP: [name], MD  No chief complaint on file.     HPI: Patient is a 61 year old gentleman who presents today for bilateral foot evaluation. He states he's been going to the [place] who is unable to get them some compression stockings he currently has 30 mmHg of compression stockings he doesn't wear the feels they're too tight he is waiting on some that are 15- 20 mmHg.  Today wearing no compression with pitting edema bilaterally.   Assessment & Plan: Visit Diagnoses:  1. Onychomycosis     Plan: Nails trimmed 8 he'll follow-up in office in 3 months for foot evaluation. Encouraged him to wear compression stockings daily.  Follow-Up Instructions: Return in about 3 months (around 05/16/2017).   Ortho Exam  Patient is alert, oriented, no adenopathy, well-dressed, normal affect, normal respiratory effort. 2+ pitting edema to bilateral lower extremities with no open ulcerations are weeping. On examination him the right great toe and second toe are surgically absent. He has thickened and onychomycotic nails 8. There are no open ulcers, drainage or erythema. Patient is unable to safely trim his own nails these were trimmed today in the office for him times 8. Imaging: No results found. No images are attached to the encounter.  Labs: Lab Results  Component Value Date   HGBA1C 8.3 11/19/2016   HGBA1C 8.0 05/10/2016   HGBA1C 10.5 (H) 01/29/2016   ESRSEDRATE 8 01/29/2016   ESRSEDRATE 9 04/11/2012   CRP 2.6 (H) 01/29/2016   CRP 0.5 02/28/2014   REPTSTATUS 02/03/2016 FINAL 01/29/2016   REPTSTATUS 02/03/2016 FINAL 01/29/2016   GRAMSTAIN  04/11/2012    NO WBC SEEN RARE SQUAMOUS EPITHELIAL CELLS PRESENT MODERATE GRAM POSITIVE COCCI IN PAIRS RARE  GRAM NEGATIVE RODS   GRAMSTAIN  04/11/2012    NO WBC SEEN RARE SQUAMOUS EPITHELIAL CELLS PRESENT MODERATE GRAM POSITIVE COCCI IN PAIRS RARE GRAM NEGATIVE RODS   CULT NO GROWTH 5 DAYS 01/29/2016   CULT NO GROWTH 5 DAYS 01/29/2016   LABORGA NO GROWTH 2 DAYS 07/19/2014    Orders:  No orders of the defined types were placed in this encounter.  No orders of the defined types were placed in this encounter.    Procedures: No procedures performed  Clinical Data: No additional findings.  ROS:  All other systems negative, except as noted in the HPI. Review of Systems  Constitutional: Negative for chills and fever.  Cardiovascular: Positive for leg swelling.  Skin: Negative for wound.    Objective: Vital Signs: There were no vitals taken for this visit.  Specialty Comments:  No specialty comments available.  PMFS History: Patient Active Problem List   Diagnosis Date Noted  . Leg swelling 01/29/2017  . Status post amputation of toe of right foot ([place]) 09/24/2016  . Idiopathic chronic venous hypertension of both lower extremities with inflammation 09/24/2016  . Onychomycosis 09/24/2016  . Midfoot skin ulcer, right, limited to breakdown of skin ([place]) 09/24/2016  . Testicular mass 04/18/2016  . Thrush 04/18/2016  . Soft tissue swelling   . Osteomyelitis ([place]) 01/29/2016  . Diabetic polyneuropathy associated with type 2 diabetes mellitus ([place])   .  Type 2 diabetes mellitus with neurologic complication, with long-term current use of insulin ([place]) 12/08/2015  . Left shoulder pain 08/10/2015  . Pulmonary nodule 02/01/2015  . Screening for colon cancer 01/09/2015  . Lumbar back pain 03/21/2014  . Screening for STD (sexually transmitted disease) 12/15/2013  . Depression 12/09/2013  . Lightheadedness 11/19/2013  . Warts, genital 08/20/2013  . 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 Right leg pain 04/27/2013  . Diabetic retinopathy ([place]) 01/28/2013    . Visit for well man health check 11/19/2010  . Sleep apnea 09/06/2010  . 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 LEG EDEMA 03/03/2009  . Essential hypertension 11/09/2008  . COPD, mild ([place]) 10/06/2006  . SICKLE-CELL TRAIT 04/26/2006  . ERECTILE DYSFUNCTION 04/26/2006  . Tobacco abuse 04/26/2006  . Peripheral vascular disease ([place]) 04/26/2006  . ALLERGIC RHINITIS 04/26/2006   Past Medical History:  Diagnosis Date  . Allergy   . Arthritis   . Bronchitis   . Cataract   . Chronic kidney disease   . Claudication [facility])    right foot ray resection  . Colon polyps    hyperplastic  . COPD (chronic obstructive pulmonary disease) ([place])   . Diabetes mellitus   . Genital warts   . Gout   . Hyperlipidemia   . Hypertension     Family History  Problem Relation Age of Onset  . Diabetes Mother   . Stroke Mother   . Heart failure Father   . Colon cancer Neg Hx   . Esophageal cancer Neg Hx   . Rectal cancer Neg Hx   . Stomach cancer Neg Hx     Past Surgical History:  Procedure Laterality Date  . AMPUTATION Right 01/31/2016   Procedure: Right 2nd Toe Amputation;  Surgeon: Newt Minion, MD;  Location: [facility];  Service: Orthopedics;  Laterality: Right;  . CATARACT EXTRACTION     right eye  . COLONOSCOPY    . I&D EXTREMITY  04/11/2012   Procedure: IRRIGATION AND DEBRIDEMENT EXTREMITY;  Surgeon: Wylene Simmer, MD;  Location: [facility];  Service: Orthopedics;  Laterality: Right;  . Surgery left great toe    . Tear ducts bilateral eyes     Social History   Occupational History  . disabled    Social History Main Topics  . Smoking status: Former Smoker    Packs/day: 0.30    Years: 48.00    Types: Cigars    Start date: 07/02/1963  . Smokeless tobacco: Former Systems developer     Comment: wants to use electric cigarettes.  not interested in pills, worried about chantix side effects  . Alcohol use 0.0 oz/week     Comment: occassional use  . Drug use: No  . Sexual activity: Not on file

## 2017-02-14 ENCOUNTER — Ambulatory Visit (INDEPENDENT_AMBULATORY_CARE_PROVIDER_SITE_OTHER): Payer: Medicare HMO | Admitting: Family Medicine

## 2017-02-14 VITALS — BP 132/70 | HR 88 | Temp 99.1°F | Wt 181.0 lb

## 2017-02-14 DIAGNOSIS — J069 Acute upper respiratory infection, unspecified: Secondary | ICD-10-CM | POA: Diagnosis not present

## 2017-02-14 MED ORDER — BENZONATATE 200 MG PO CAPS: 200.0000 mg | ORAL_CAPSULE | Freq: Two times a day (BID) | ORAL | 0 refills | Status: DC | PRN

## 2017-02-14 NOTE — Patient Instructions (Signed)
Adam Ray, you was seen today for 1 day of nonproductive cough and runny nose without fevers.  This is very likely a viral infection and will go away on its own.  Since you're requesting a medication for cough, I will prescribe Tessalon Perles that you can take as needed.  I hope that you feel better.   Take care, [name] L. Rosalyn Gess, [facility] Medicine Resident PGY-2 02/14/2017 2:20 PM

## 2017-02-14 NOTE — Progress Notes (Signed)
    Subjective:  Adam Ray is a 61 y.o. male who presents to the [facility] today with a chief complaint of cough.   HPI:  Cough yesterday non-productive.  Does have sneezing, runny nose, subjective chills. No documented fevers.  No nausea, vomiting or diarrhea.  Smokes 4-5 cigars daily.  Denies CP.  Does have. Does have a history of COPD and does not use inhalers. No sick contacts and no recent hospitalizations.   Smoking status reviewed.  Medication: reviewed and updated ROS: see HPI   Objective:  Physical Exam: BP 132/70   Pulse 88   Temp 99.1 F (37.3 C) (Oral)   Wt 181 lb (82.1 kg)   BMI 25.97 kg/m   Gen: 61yo M in NAD, resting comfortably CV: RRR with no murmurs appreciated Pulm: NWOB, CTAB with no crackles, wheezes, or rhonchi GI: Normal bowel sounds present. Soft, Nontender, Nondistended. MSK: no edema, cyanosis, or clubbing noted Skin: warm, dry Neuro: grossly normal, moves all extremities Psych: Normal affect and thought content  No results found for this or any previous visit (from the past 72 hour(s)).   Assessment/Plan:  Viral upper respiratory infection: Patient with nonproductive cough afebrile no pleuritic chest pain, satting appropriately on room air and clear lung exam very unlikely to be pneumonia.  Given his associated reported clear rhinorrhea, this is most likely viral upper respiratory infection if anything. Patient asked for cough medicine.  - Tessalon Perles 200 mg twice a day as needed - Return precautions discussed - Follow-up as needed

## 2017-02-21 DIAGNOSIS — R6889 Other general symptoms and signs: Secondary | ICD-10-CM | POA: Diagnosis not present

## 2017-02-22 ENCOUNTER — Ambulatory Visit (HOSPITAL_COMMUNITY)
Admission: EM | Admit: 2017-02-22 | Discharge: 2017-02-22 | Disposition: A | Discharge status: Home / Self Care | Payer: Medicare HMO | Attending: Family Medicine | Admitting: Family Medicine

## 2017-02-22 ENCOUNTER — Encounter (HOSPITAL_COMMUNITY): Payer: Self-pay | Admitting: *Deleted

## 2017-02-22 DIAGNOSIS — B9789 Other viral agents as the cause of diseases classified elsewhere: Secondary | ICD-10-CM | POA: Diagnosis not present

## 2017-02-22 DIAGNOSIS — J069 Acute upper respiratory infection, unspecified: Secondary | ICD-10-CM | POA: Diagnosis not present

## 2017-02-22 MED ORDER — HYDROCODONE-HOMATROPINE 5-1.5 MG/5ML PO SYRP: 5.0000 mL | ORAL_SOLUTION | Freq: Four times a day (QID) | ORAL | 0 refills | Status: DC | PRN

## 2017-02-22 NOTE — ED Triage Notes (Signed)
C/O "head cold"; cough, congestion, wheezing x 3 days.

## 2017-02-22 NOTE — ED Provider Notes (Signed)
  [place] [place]   [id number] 02/22/17 Arrival Time: 7062  ASSESSMENT & PLAN:  1. Viral URI with cough     Meds ordered this encounter  Medications  . HYDROcodone-homatropine (HYCODAN) 5-1.5 MG/5ML syrup    Sig: Take 5 mLs by mouth every 6 (six) hours as needed for cough.    Dispense:  90 mL    Refill:  0    OTC analgesics and symptom care as needed. Medication sedation precautions. May f/u as needed.  Reviewed expectations re: course of current medical issues. Questions answered. Outlined signs and symptoms indicating need for more acute intervention. Patient verbalized understanding. After Visit Summary given.   SUBJECTIVE:  Adam Ray is a 61 y.o. male who presents with complaint of nasal congestion, post-nasal drainage, and coughing for 3 days. Acute onset. His wife with the same. No fever reported. Decreased sleep secondary to coughing. No SOB or wheezing. Non-smoker. No n/v. OTC medications without relief.   ROS: As per HPI.   OBJECTIVE:  Vitals:   02/22/17 1214  BP: 105/60  Pulse: 64  Resp: 16  Temp: 98.3 F (36.8 C)  TempSrc: Oral  SpO2: 98%     General appearance: alert; no distress HEENT: nasal congestion; clear runny nose; throat irritation secondary to post-nasal drainage Neck: supple without LAD Lungs: clear to auscultation bilaterally; dry cough Skin: warm and dry Psychological:  alert and cooperative; normal mood and affect   No Known Allergies  Past Medical History:  Diagnosis Date  . Allergy   . Arthritis   . Bronchitis   . Cataract   . Chronic kidney disease   . Claudication [facility])    right foot ray resection  . Colon polyps    hyperplastic  . COPD (chronic obstructive pulmonary disease) ([place])   . Diabetes mellitus   . Genital warts   . Gout   . Hyperlipidemia   . Hypertension            Vanessa Kick, MD 02/22/17 1241

## 2017-02-27 DIAGNOSIS — R6889 Other general symptoms and signs: Secondary | ICD-10-CM | POA: Diagnosis not present

## 2017-03-21 ENCOUNTER — Encounter: Payer: Self-pay | Admitting: Family Medicine

## 2017-03-21 ENCOUNTER — Ambulatory Visit (INDEPENDENT_AMBULATORY_CARE_PROVIDER_SITE_OTHER): Payer: Medicare HMO | Admitting: Family Medicine

## 2017-03-21 VITALS — BP 118/62 | HR 67 | Temp 98.2°F | Ht 70.0 in | Wt 176.8 lb

## 2017-03-21 DIAGNOSIS — Z23 Encounter for immunization: Secondary | ICD-10-CM

## 2017-03-21 DIAGNOSIS — Q231 Congenital insufficiency of aortic valve: Secondary | ICD-10-CM | POA: Diagnosis not present

## 2017-03-21 DIAGNOSIS — E1142 Type 2 diabetes mellitus with diabetic polyneuropathy: Secondary | ICD-10-CM

## 2017-03-21 DIAGNOSIS — Q383 Other congenital malformations of tongue: Secondary | ICD-10-CM | POA: Diagnosis not present

## 2017-03-21 DIAGNOSIS — Z720 Tobacco use: Secondary | ICD-10-CM | POA: Diagnosis not present

## 2017-03-21 DIAGNOSIS — B370 Candidal stomatitis: Secondary | ICD-10-CM

## 2017-03-21 DIAGNOSIS — E1140 Type 2 diabetes mellitus with diabetic neuropathy, unspecified: Secondary | ICD-10-CM | POA: Diagnosis not present

## 2017-03-21 DIAGNOSIS — Z794 Long term (current) use of insulin: Secondary | ICD-10-CM | POA: Diagnosis not present

## 2017-03-21 LAB — GLUCOSE, POCT (MANUAL RESULT ENTRY)
POC Glucose: 57 mg/dl — AB (ref 70–99)
POC Glucose: 86 mg/dl (ref 70–99)

## 2017-03-21 MED ORDER — BACLOFEN 10 MG PO TABS: 10.0000 mg | ORAL_TABLET | Freq: Three times a day (TID) | ORAL | 0 refills | Status: DC | PRN

## 2017-03-21 MED ORDER — MELOXICAM 15 MG PO TABS: 15.0000 mg | ORAL_TABLET | Freq: Every day | ORAL | 0 refills | Status: DC | PRN

## 2017-03-21 NOTE — Patient Instructions (Signed)
We will request records from the [place] Please follow up with me in 3 months, sooner if needed Referring to ENT for your tongue Sent in medication for your knee - meloxicam Also refilled baclofen  Be well, Dr. [name]

## 2017-03-21 NOTE — Progress Notes (Signed)
Date of Visit: 03/21/2017   HPI:  Patient presents for routine follow up.  Left knee pain - has had to get on his left knee in the last week for work. Now is sore over anterior aspect. No definite preceding injury. Would like rx for meloxicam, has helped in the past with musculoskeletal  Issues.  Diabetes- current insulin regimen is lantus 25 units twice daily, and novolog 25 units with lunch and dinner. Has been seeing the endocrinologist at the [facility], and has follow up visit in 2 weeks. Just had a bunch of labs taken from the [place] recently so would like to avoid having more labwork today. Did not eat breakfast yet and sugar is low this AM.  White coating on tongue - has been present for almost a year. Has done multiple rounds of nystatin liquid without improvement. I previously referred him to ENT for it, but he says he instead saw a dentist who was not helpful. Agreeable to seeing ENT this time.  Bicuspid aortic valve - discussed with patient today, as I noted it on his problem list previously. Had planned to recommend echo, but patient reports he just had one at the [place] in the last month.  Tobacco abuse - reports he has quit smoking entirely.  Baclofen refill - requests refill of baclofen, states uses this as needed when has muscular back pain, uses sparingly, helps his symptoms. Just needs refill. Does not make him sleepy.  ROS: See HPI.  [place]: history of bicuspid aortic valve, diabetes, tobacco abuse, hypertension, COPD, diabetic retinopathy  PHYSICAL EXAM: BP 118/62   Pulse 67   Temp 98.2 F (36.8 C) (Oral)   Ht 5\' 10"  (1.778 m)   Wt 176 lb 12.8 oz (80.2 kg)   SpO2 98%   BMI 25.37 kg/m  Gen: no acute distress, pleasant, cooperative HEENT: normocephalic, atraumatic, moist mucous membranes. + thick white coating on tongue as per prior exams Heart: regular rate and rhythm, no murmur Lungs: clear to auscultation bilaterally, normal work of breathing  Neuro: alert, grossly nonfocal,  speech normal Ext: L knee without effusion, warmth, or bony tenderness. MCL & LCL intact to varus & valgus stressing. mcmurrays and lachmans negative. Area of thickened skin present on anterior knee, suggesting prior repeated episodes of kneeling.  ASSESSMENT/PLAN:  Health maintenance:  -flu shot given today  -advised to schedule Annual Wellness Visit   Diabetic polyneuropathy associated with type 2 diabetes mellitus ([place]) Diabetes is managed by endocrinologist at the [facility]. Glucose low in office today at 54. Patient given glucose tube with improvement in the 80s. He was counseled to go eat breakfast right after the appointment. Discussed with patient that I would recommend adjusting his insulin down a little bit, but he is hesitant to do this and thinks insulin is working well, it's just that he didn't eat yet this AM. Will request records from [place].  Tobacco abuse Reports he's quit. Congratulated him on this!  BICUSPID AORTIC VALVE Evidently he just had an echo at the [place] recently. Will request records. Does not complain of any cardiac or pulmonary symptoms.   Thrush Unclear if this is thrush (previously was KOH neg) as it has not responded to multiple rounds of nystatin. Question whether he would benefit from biopsy Will refer to ENT for further assessment, patient agreable  Left knee pain Mild, no significant abnormalities on exam. Refilled meloxicam. Counseled to use ice pack. Return if not improving.  Intermittent back pain Did not address in detail.  Well controlled with baclofen. Refilled today.   FOLLOW UP: Follow up in 3 mos for routine medical issues Referring to ENT  [name], [place]

## 2017-03-22 NOTE — Assessment & Plan Note (Signed)
Unclear if this is thrush (previously was KOH neg) as it has not responded to multiple rounds of nystatin. Question whether he would benefit from biopsy Will refer to ENT for further assessment, patient agreable

## 2017-03-22 NOTE — Assessment & Plan Note (Signed)
Diabetes is managed by endocrinologist at the [facility]. Glucose low in office today at 54. Patient given glucose tube with improvement in the 80s. He was counseled to go eat breakfast right after the appointment. Discussed with patient that I would recommend adjusting his insulin down a little bit, but he is hesitant to do this and thinks insulin is working well, it's just that he didn't eat yet this AM. Will request records from [place].

## 2017-03-22 NOTE — Assessment & Plan Note (Signed)
Reports he's quit. Congratulated him on this!

## 2017-03-22 NOTE — Assessment & Plan Note (Signed)
Evidently he just had an echo at the [place] recently. Will request records. Does not complain of any cardiac or pulmonary symptoms.

## 2017-03-26 ENCOUNTER — Other Ambulatory Visit: Payer: Self-pay | Admitting: Internal Medicine

## 2017-03-27 DIAGNOSIS — R6889 Other general symptoms and signs: Secondary | ICD-10-CM | POA: Diagnosis not present

## 2017-04-03 DIAGNOSIS — K143 Hypertrophy of tongue papillae: Secondary | ICD-10-CM | POA: Insufficient documentation

## 2017-04-10 ENCOUNTER — Telehealth: Payer: Self-pay

## 2017-04-10 NOTE — Telephone Encounter (Signed)
Called patient. He had two questions:  1. Asked if we had gotten his [place] records yet. I told him I have not yet received anything.  2. Wanted me to know the ENT doctor he saw did not know what was going on with his tongue, and evidently recommended he see a "tongue doctor". Advised patient I do not know of a tongue doctor, and that ENT physicians are usually the specialists in tongue issues. He is going to the [place] for an appointment next week and will talk with them there about what his options might be through the [place] system.  Patient appreciative.  [name], MD

## 2017-04-10 NOTE — Telephone Encounter (Signed)
Pt called. Asked Dr. [name] to give him a call as soon as possible. Would not explain the reason for the call. Michela Pitcher he would tell the Dr. Please call him on [phone]. Ottis Stain, CMA

## 2017-04-11 ENCOUNTER — Ambulatory Visit: Payer: Medicare HMO | Admitting: *Deleted

## 2017-04-15 DIAGNOSIS — R6889 Other general symptoms and signs: Secondary | ICD-10-CM | POA: Diagnosis not present

## 2017-05-05 ENCOUNTER — Other Ambulatory Visit: Payer: Self-pay | Admitting: Family Medicine

## 2017-05-05 NOTE — Telephone Encounter (Signed)
Red team, Please let patient know I will send in this meloxicam but he should not take any ibuprofen if he is taking meloxicam  Thanks [name], MD

## 2017-05-08 NOTE — Telephone Encounter (Signed)
Pt informed. Deseree Blount, CMA  

## 2017-05-12 ENCOUNTER — Ambulatory Visit (INDEPENDENT_AMBULATORY_CARE_PROVIDER_SITE_OTHER): Payer: Medicare HMO | Admitting: Orthopedic Surgery

## 2017-05-28 ENCOUNTER — Telehealth: Payer: Self-pay | Admitting: Psychology

## 2017-05-28 NOTE — Telephone Encounter (Signed)
Called to request an appointment for mental health.  He hasn't talked to his PCP about this recently.  Asked at the front desk about mental health services.  I set him up to see Larene Beach on 12/3 at 11:00.  He has depression on his problem list - was taking Zoloft inconsistently.  Did not delve into difficulties on the phone.

## 2017-06-02 ENCOUNTER — Ambulatory Visit (INDEPENDENT_AMBULATORY_CARE_PROVIDER_SITE_OTHER): Payer: Medicare HMO | Admitting: Psychology

## 2017-06-02 DIAGNOSIS — F411 Generalized anxiety disorder: Secondary | ICD-10-CM
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
## 2017-06-02 NOTE — Patient Instructions (Signed)
It was good to meet you today. Please work on a letter to family members, as we discussed. Please don't send it. Bring with you when I see you on December 17 at 11 am

## 2017-06-02 NOTE — Assessment & Plan Note (Signed)
Assessment / Plan / Recommendations: Adam Ray is experiencing moderate anxiety (GAD7=10, somewhat difficult) and minimal depression (PHQ9=3, not difficult,no SI). He is not taking Zoloft or any medication for his anxiety. He is experiencing several stressors that he would like to discuss. He will return on December 17 at 11:00 a.m.

## 2017-06-16 ENCOUNTER — Ambulatory Visit (INDEPENDENT_AMBULATORY_CARE_PROVIDER_SITE_OTHER): Payer: Medicare HMO | Admitting: Psychology

## 2017-06-16 DIAGNOSIS — F411 Generalized anxiety disorder: Secondary | ICD-10-CM

## 2017-06-16 NOTE — Assessment & Plan Note (Signed)
Assessment/Plan/Recommnedations: Adam Ray returned to discuss stressors and past events that are troubling him. He has symptoms of mild depression (PHQ9=6, no SI) and mild anxiety (GAD7=7). I provided reflective listening and support.
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
## 2017-06-16 NOTE — Patient Instructions (Addendum)
Adam Ray, it was good to see you today. I'm sorry that we did not have more time to talk. Please schedule a follow-up visit for January 7. Thank you.

## 2017-06-27 ENCOUNTER — Telehealth: Payer: Self-pay | Admitting: Psychology

## 2017-06-27 NOTE — Telephone Encounter (Signed)
Patient left a VM yesterday asking me to call him back today because it was "urgent."  When I spoke to him, he said the situation had calmed down and he used the strategy of "walking away."  He said he didn't need to be seen urgently.  He sees Larene Beach who is not here this coming Monday secondary to the holiday.  He was okay with scheduling for the 7th at 2:00.  He was able to repeat the appointment date and time back to me.

## 2017-07-04 ENCOUNTER — Other Ambulatory Visit: Payer: Self-pay | Admitting: Endocrinology

## 2017-07-07 ENCOUNTER — Ambulatory Visit: Payer: Medicare HMO

## 2017-07-07 ENCOUNTER — Other Ambulatory Visit: Payer: Self-pay | Admitting: Family Medicine

## 2017-07-07 MED ORDER — FUROSEMIDE 40 MG PO TABS: 40.0000 mg | ORAL_TABLET | Freq: Every day | ORAL | 0 refills | Status: DC

## 2017-07-07 MED ORDER — MELOXICAM 15 MG PO TABS: 15.0000 mg | ORAL_TABLET | Freq: Every day | ORAL | 0 refills | Status: DC | PRN

## 2017-07-14 DIAGNOSIS — R6889 Other general symptoms and signs: Secondary | ICD-10-CM | POA: Diagnosis not present

## 2017-07-16 ENCOUNTER — Telehealth: Payer: Self-pay | Admitting: Psychology
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
## 2017-07-16 NOTE — Telephone Encounter (Deleted)
Patient left a VM yesterday saying it was

## 2017-07-17 ENCOUNTER — Other Ambulatory Visit: Payer: Self-pay | Admitting: *Deleted

## 2017-07-17 NOTE — Telephone Encounter (Signed)
30 day supply not appropriate. Patient needs follow up to monitor kidney function before doing this for 3 more months Please ask patient to schedule follow up with me Thanks [name], MD

## 2017-07-17 NOTE — Telephone Encounter (Signed)
Requesting 90-day supply. Geralynn Capri Kennon Holter, CMA

## 2017-07-18 NOTE — Telephone Encounter (Signed)
Attempted to call patient to schedule appointment per Dr. [name], but, there was no answer and there was no voicemail box set up.Ozella Almond, CMA

## 2017-07-21 ENCOUNTER — Ambulatory Visit (INDEPENDENT_AMBULATORY_CARE_PROVIDER_SITE_OTHER): Payer: Medicare HMO | Admitting: Psychology

## 2017-07-21 DIAGNOSIS — F411 Generalized anxiety disorder: Secondary | ICD-10-CM
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
## 2017-07-21 NOTE — Assessment & Plan Note (Addendum)
Assessment/Plan/Recommnedations: Because this patient had great difficulty reading the forms and writing his responses during prior visits, I did not do any screening (e.g., PHQ-9 and GAD-7), but did ask Antwion about suicidal ideation and homicidal ideation as well. He denied both. Londell is difficult to understand, likely due to cognitive and dental issues. Because of this and his rambling communication style, along with the tendency for he and his wife to argue at length, each session with him takes close to -- or over -- an hour with very little progress made. He and his wife spent much of their time in my office shouting at each other and accusing each other of infidelity and mistreatment (e.g., cussing and yelling). I asked Peregrine to articulate some ideas for distress tolerance, such as going on a drive, taking a walk, watching a funny TV show, or calling a friend. He said that he would take a walk when distressed, that his car does not run, he has no friends because of his wife's interference, and that he does not like funny TV shows. Because these sessions have not been productive, I suggested that Axxel seek weekly hour-long counseling sessions at the [facility] where his wife is currently receiving therapy. In addition, I recommended that they inquire about marital therapy at the [facility] and/or ask for a referral. I spent some time reviewing the role of Integrative Care with Dominica Severin. I explained that I would see him in two weeks with the expectation that he would seek a referral from [facility], the [facility] or [facility] and our next visit would just be to check in on his progress. He was given referral information and phone numbers to take with him. He will return on February 4 at 2:30 p.m.

## 2017-07-22 ENCOUNTER — Encounter: Payer: Self-pay | Admitting: Family Medicine

## 2017-07-22 ENCOUNTER — Other Ambulatory Visit: Payer: Self-pay

## 2017-07-22 ENCOUNTER — Ambulatory Visit (INDEPENDENT_AMBULATORY_CARE_PROVIDER_SITE_OTHER): Payer: Medicare HMO | Admitting: Family Medicine

## 2017-07-22 VITALS — BP 104/68 | HR 71 | Temp 98.3°F | Ht 70.0 in | Wt 182.4 lb

## 2017-07-22 DIAGNOSIS — E118 Type 2 diabetes mellitus with unspecified complications: Secondary | ICD-10-CM | POA: Diagnosis not present

## 2017-07-22 DIAGNOSIS — B370 Candidal stomatitis: Secondary | ICD-10-CM | POA: Diagnosis not present

## 2017-07-22 DIAGNOSIS — E1140 Type 2 diabetes mellitus with diabetic neuropathy, unspecified: Secondary | ICD-10-CM

## 2017-07-22 DIAGNOSIS — Z794 Long term (current) use of insulin: Secondary | ICD-10-CM | POA: Diagnosis not present

## 2017-07-22 DIAGNOSIS — S00552A Superficial foreign body of oral cavity, initial encounter: Secondary | ICD-10-CM | POA: Diagnosis not present

## 2017-07-22 LAB — POCT GLYCOSYLATED HEMOGLOBIN (HGB A1C): HEMOGLOBIN A1C: 9.6

## 2017-07-22 MED ORDER — FLUCONAZOLE 150 MG PO TABS: 150.0000 mg | ORAL_TABLET | Freq: Every day | ORAL | 0 refills | Status: DC

## 2017-07-22 NOTE — Assessment & Plan Note (Signed)
Patient has a history of thrush.  Has a white film on his tongue but nowhere else on his oropharynx. Notes that he was previously diagnosed with thrush and has been treated with several rounds of nystatin without any improvement.  Per epic review he has been tested for thrush and was KOH negative.  He was also referred to ENT, unclear if he went to this.  He is likely having thrush secondary to uncontrolled diabetes.  Discussed possible alternative treatments such as Diflucan for a 2-week course.  He was agreeable to try this. -Diflucan 150 mg daily for 2 weeks -Discussed testing for HIV for possible immunodeficiency, patient would like to defer this.  Been previously negative. -Follow-up in 1 week with PCP

## 2017-07-22 NOTE — Assessment & Plan Note (Signed)
Not discussed diabetes today as this was a same-day visit.  Did check his A1c and asked that he follow-up with his PCP.

## 2017-07-22 NOTE — Patient Instructions (Signed)
Thank you for coming in today, it was so nice to see you! Today we talked about:    Possible fish bone in the gum: this may come out on its own. I do suggest going to the dentist. Please come back if your gum turns red, gets painful, has pus coming out, or you get a fever  Yeast on tongue: This is likely from high sugars in your blood. We are going to try a antifungal pill. Take 1 pill a day for 2 weeks. We will see if this works  Please follow up with Dr. [name].  If we ordered any tests today, you will be notified via telephone of any abnormalities. If everything is normal you will get a letter in the mail.   If you have any questions or concerns, please do not hesitate to call the office at [phone]. You can also message me directly via MyChart.   Sincerely,  Smitty Cords, MD

## 2017-07-22 NOTE — Assessment & Plan Note (Signed)
Possible foreign body in left lower jaw gingiva.  Patient notes that he thinks it is a fishbone.  I was unable to appreciate any foreign bodies on exam or palpation.  Discussed that he should have a dentist if he is having concerns of a foreign body in this gum area.  He only has 1 tooth and all the rest of his mouth is exposed gingiva.  Does not wear dentures -Asked patient to schedule appointment with his dentist -Strict return precautions discussed including gingival erythema, swelling, discharge, increased pain, fever

## 2017-07-22 NOTE — Progress Notes (Signed)
Subjective:    Patient ID: Adam Ray , male   DOB: 1956-03-12 , 62 y.o..   MRN: [id number]  HPI  Adam Ray is a 62 year old with history of hypertension, peripheral vascular disease, COPD, sleep apnea, type 2 diabetes here for a same-day visit for Chief Complaint  Patient presents with  . Gum Problem    1. Gum problem: Patient states he was eating Saturday afternoon, fish, he thinks that a fish bone went into the top left jaw. He is unsure if it came out. He did not see the bone but felt it. Denies any pain right now, there never was pain even when he felt the fish bone. Denies any mouth bleeding. He has been able to eat and drink normally. He only has 1 tooth, all the rest were pulled.   2. Tongue whiteness: He is also endorsing tongue whiteness for about 6 years, he notes he doesn't like it because his wife doesn't kiss him anymore. He has tried nystatin liquid swish and swallow several times in the past and it hasn't helped. He last took it 6 months ago and was told to stop it because it wasn't helping.  Review of Systems: Per HPI.  Past Medical History: Patient Active Problem List   Diagnosis Date Noted  . Gingival foreign body 07/22/2017  . Anxiety state 06/02/2017  . Leg swelling 01/29/2017  . Status post amputation of toe of right foot ([place]) 09/24/2016  . Idiopathic chronic venous hypertension of both lower extremities with inflammation 09/24/2016  . Onychomycosis 09/24/2016  . Midfoot skin ulcer, right, limited to breakdown of skin ([place]) 09/24/2016  . Testicular mass 04/18/2016  . Thrush 04/18/2016  . Soft tissue swelling   . Osteomyelitis ([place]) 01/29/2016  . Diabetic polyneuropathy associated with type 2 diabetes mellitus ([place])   . Type 2 diabetes mellitus with neurologic complication, with long-term current use of insulin ([place]) 12/08/2015  . Left shoulder pain 08/10/2015  . Pulmonary nodule 02/01/2015  . Screening for colon cancer 01/09/2015  . Lumbar back pain  03/21/2014  . Screening for STD (sexually transmitted disease) 12/15/2013  . Depression 12/09/2013  . Lightheadedness 11/19/2013  . Warts, genital 08/20/2013  . 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 Right leg pain 04/27/2013  . Diabetic retinopathy ([place]) 01/28/2013  . Visit for well man health check 11/19/2010  . Sleep apnea 09/06/2010  . 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 LEG EDEMA 03/03/2009  . Essential hypertension 11/09/2008  . COPD, mild ([place]) 10/06/2006  . SICKLE-CELL TRAIT 04/26/2006  . ERECTILE DYSFUNCTION 04/26/2006  . Tobacco abuse 04/26/2006  . Peripheral vascular disease ([place]) 04/26/2006  . ALLERGIC RHINITIS 04/26/2006    Medications: reviewed   Social Hx:  reports that he has quit smoking. His smoking use included cigars. He started smoking about 54 years ago. He has a 14.40 pack-year smoking history. He has quit using smokeless tobacco.   Objective:   BP 104/68   Pulse 71   Temp 98.3 F (36.8 C) (Oral)   Ht 5\' 10"  (1.778 m)   Wt 182 lb 6.4 oz (82.7 kg)   SpO2 97%   BMI 26.17 kg/m  Physical Exam  Gen: NAD, alert, cooperative with exam, well-appearing HEENT:     Head: Normocephalic, atraumatic    Neck: No masses palpated. No goiter. No lymphadenopathy     Ears: External ears normal, no drainage.Tympanic membranes intact, normal light reflex bilaterally, no erythema or bulging    Nose: nasal turbinates  moist, no nasal discharge    Throat: moist mucus membranes, no pharyngeal erythema, no tonsillar exudate. Airway is patent Mouth: Tongue appears to have white film, no white lesions anywhere else in oropharynx. 1 tooth on lower left jaw. No foreign bodies seen or palpated on gums  Assessment & Plan:  Thrush Patient has a history of thrush.  Has a white film on his tongue but nowhere else on his oropharynx. Notes that he was previously diagnosed with thrush and has been treated with several rounds of nystatin without any  improvement.  Per epic review he has been tested for thrush and was KOH negative.  He was also referred to ENT, unclear if he went to this.  He is likely having thrush secondary to uncontrolled diabetes.  Discussed possible alternative treatments such as Diflucan for a 2-week course.  He was agreeable to try this. -Diflucan 150 mg daily for 2 weeks -Discussed testing for HIV for possible immunodeficiency, patient would like to defer this.  Been previously negative. -Follow-up in 1 week with PCP  Gingival foreign body Possible foreign body in left lower jaw gingiva.  Patient notes that he thinks it is a fishbone.  I was unable to appreciate any foreign bodies on exam or palpation.  Discussed that he should have a dentist if he is having concerns of a foreign body in this gum area.  He only has 1 tooth and all the rest of his mouth is exposed gingiva.  Does not wear dentures -Asked patient to schedule appointment with his dentist -Strict return precautions discussed including gingival erythema, swelling, discharge, increased pain, fever  Type 2 diabetes mellitus with neurologic complication, with long-term current use of insulin ([place]) Not discussed diabetes today as this was a same-day visit.  Did check his A1c and asked that he follow-up with his PCP.  Orders Placed This Encounter  Procedures  . POCT glycosylated hemoglobin (Hb A1C)   Meds ordered this encounter  Medications  . fluconazole (DIFLUCAN) 150 MG tablet    Sig: Take 1 tablet (150 mg total) by mouth daily.    Dispense:  14 tablet    Refill:  0    Smitty Cords, MD [place], PGY-3

## 2017-07-29 ENCOUNTER — Telehealth: Payer: Self-pay | Admitting: Psychology

## 2017-07-29 NOTE — Telephone Encounter (Signed)
[name] left four VMs yesterday at 2:14, 2:29, 2:53, and 4:49 stating he would like to speak to the psychiatrist.  He said that if he could not get me today, he wasn't sure if he would make it by tonight.  I called him first thing this morning.  He was feeling better.  Denied thoughts of hurting himself.  Was planning to take his wife (I think) to work.  Discussed several things including the recommendation for him to call the [facility] to get more intensive care.  He said he would call this morning.    I reminded him that my number is not an emergency number.  He remembered the options we discussed last time:  911 or emergency department.  He does not like either of those options.  I told him we do not have a psychiatrist here but do have mental health providers.  If he is in distress and needs to talk to someone, he should tell the person answering the phone that when he first calls (apparently, he was transferred to my number because he asked to speak to the psychiatrist).  They can get a [facility] or send to the nurse for triage.    He voiced an understanding but I am not entirely sure it will change his behavior.  Will let Adam Ray know.  I told Adam Ray one of [place] will give him a call to make sure he is connected to the [facility].

## 2017-08-01 ENCOUNTER — Ambulatory Visit: Payer: Medicare HMO | Admitting: Family Medicine

## 2017-08-04 ENCOUNTER — Ambulatory Visit: Payer: Medicare HMO | Admitting: Psychology

## 2017-08-04 DIAGNOSIS — F411 Generalized anxiety disorder: Secondary | ICD-10-CM

## 2017-08-04 NOTE — Assessment & Plan Note (Signed)
Assessment/Plan/Recommendation: The patient would benefit from weekly counseling at the [facility] or [facility]. I also suggested marital counseling; both the patient and his wife dismissed this suggestion. I tried to give the patient and his wife some tips on effective communication (e.g., use "I" statements instead of accusations, purposeful delay, broken record). Neither was receptive to this. I provided them with referral information and explained that the patient can follow up if needed.

## 2017-08-04 NOTE — Patient Instructions (Addendum)
Adam Ray: I hope that you start feeling better. Please contact your medical provider regarding your medication question. Some providers that I would like you to contact for weekly counseling:  [facility] [place], [place], [place] [address]  Phone: [phone]  [facility] [phone]   [facility] [phone]  Feel free to call the front desk and ask for [place] if you need additional assistance in obtaining outpatient treatment.

## 2017-08-04 NOTE — Progress Notes (Signed)
Reason for follow-up:  Adam Ray returns with his wife to discuss their marital problems and his anxiety.  Issues discussed:  The patient requested medication for his anxiety. I explained that he would need to set up an appointment with Dr. [name] for this. The patient's wife claimed that Adam Ray was able to obtain prescriptions during visits, but I told her that I do not have this ability. Although the patient has been quite agitated during his visits with me and his phone calls with Adam Ray, his PHQ9 and GAD7 scores were quite low so this is perplexing (I did not give these today, as the patient seemed to have a difficult time reading them and filling them out). Adam Ray and his wife spent most of the visit yelling at each other. She was upset because he has been purchasing liquor and also did not tell her about his purchase of Viagra.

## 2017-08-07 ENCOUNTER — Other Ambulatory Visit: Payer: Self-pay | Admitting: Family Medicine

## 2017-08-08 NOTE — Telephone Encounter (Signed)
Patient has appt scheduled on 08/22/17. Danley Danker, RN [facility] RN)

## 2017-08-11 ENCOUNTER — Other Ambulatory Visit: Payer: Self-pay | Admitting: Family Medicine

## 2017-08-14 DIAGNOSIS — F411 Generalized anxiety disorder: Secondary | ICD-10-CM | POA: Diagnosis not present

## 2017-08-19 ENCOUNTER — Ambulatory Visit (INDEPENDENT_AMBULATORY_CARE_PROVIDER_SITE_OTHER): Payer: Medicare HMO | Admitting: Podiatry

## 2017-08-19 DIAGNOSIS — M79676 Pain in unspecified toe(s): Secondary | ICD-10-CM | POA: Diagnosis not present

## 2017-08-19 DIAGNOSIS — B351 Tinea unguium: Secondary | ICD-10-CM

## 2017-08-19 DIAGNOSIS — E1149 Type 2 diabetes mellitus with other diabetic neurological complication: Secondary | ICD-10-CM | POA: Diagnosis not present

## 2017-08-19 DIAGNOSIS — Z899 Acquired absence of limb, unspecified: Secondary | ICD-10-CM

## 2017-08-19 DIAGNOSIS — L84 Corns and callosities: Secondary | ICD-10-CM | POA: Diagnosis not present

## 2017-08-20 NOTE — Progress Notes (Signed)
Subjective: 62 y.o. returns the office today for painful, elongated, thickened toenails which he cannot trim himself. Denies any redness or drainage around the nails.  He also has painful calluses and wants to have trimmed.  Inspection of the nails and the calluses as short as possible.  Is also requesting new diabetic shoes and inserts today.  Denies any acute changes since last appointment and no new complaints today. Denies any systemic complaints such as fevers, chills, nausea, vomiting.   PCP: [name], MD   Last A1c was 9.6   Objective: AAO 3, NAD DP/PT pulses palpable, CRT less than 3 seconds Protective sensation decreased with Simms Weinstein monofilament, Achilles tendon reflex intact.  Nails hypertrophic, dystrophic, elongated, brittle, discolored 9. There is tenderness overlying the nails 1-5 on the left and 2-5 on the right. There is no surrounding erythema or drainage along the nail sites. Hyperkeratotic lesions present at the stump of the first metatarsal on the right foot as well as left medial first metatarsal phalangeal joint and the distal right third toe and left third toe.  Upon debridement there is no underlying ulceration, drainage or any signs of infection noted. Hammertoes are present No open lesions or pre-ulcerative lesions are identified. No other areas of tenderness bilateral lower extremities. No overlying edema, erythema, increased warmth. No pain with calf compression, swelling, warmth, erythema.  Assessment: Patient presents with symptomatic onychomycosis; hyperkeratotic lesions requesting diabetic shoes  Plan: -Treatment options including alternatives, risks, complications were discussed -Nails sharply debrided 9 without complication/bleeding. -Hyperkeratotic lesions were sharply debrided x4 without any complications or bleeding.  He want me to trim the nails and the callus is much shorter.  I did trim them as short as I felt comfortable doing.   I felt that a picture of them anymore they were going to bleed. -He was measured for diabetic shoes and inserts today with Liliane Channel.  Paperwork was completed today for precertification of diabetic shoes.  Given his history of amputation as well as pre-ulcerative calluses and digital deformity he would benefit from shoes. -Discussed daily foot inspection. If there are any changes, to call the office immediately.  -Follow-up in 3 months or sooner if any problems are to arise. In the meantime, encouraged to call the office with any questions, concerns, changes symptoms.  Celesta Gentile, DPM

## 2017-08-22 ENCOUNTER — Ambulatory Visit: Payer: Medicare HMO | Admitting: Family Medicine

## 2017-08-26 ENCOUNTER — Other Ambulatory Visit: Payer: Medicare HMO

## 2017-08-26 DIAGNOSIS — F411 Generalized anxiety disorder: Secondary | ICD-10-CM | POA: Diagnosis not present

## 2017-08-28 ENCOUNTER — Telehealth: Payer: Self-pay | Admitting: Family Medicine

## 2017-08-28 NOTE — Telephone Encounter (Signed)
I received a request from [facility] and [facility] to sign for patient's diabetic shoes. I am not able to sign this form, as I am not managing patient's diabetes. He goes to the [facility] for his diabetes.  Red team, can you call them at [phone] (option 2) and let them know that they need to contact his [facility] doctor to have these forms completed?  Thanks [name], MD

## 2017-09-01 NOTE — Telephone Encounter (Signed)
[facility] informed. Deseree Kennon Holter, CMA

## 2017-09-02 ENCOUNTER — Encounter: Payer: Medicare HMO | Admitting: Family Medicine

## 2017-09-10 ENCOUNTER — Other Ambulatory Visit: Payer: Self-pay | Admitting: *Deleted

## 2017-09-10 NOTE — Telephone Encounter (Signed)
Pharmacy requested 90 day supply. Please advise. Deseree Kennon Holter, CMA

## 2017-09-11 MED ORDER — FUROSEMIDE 40 MG PO TABS: 40.0000 mg | ORAL_TABLET | Freq: Every day | ORAL | 0 refills | Status: DC

## 2017-09-11 NOTE — Telephone Encounter (Signed)
Patient needs to schedule follow up visit with me. I will not send in 90 day supply before seeing him. [name], MD

## 2017-09-15 ENCOUNTER — Other Ambulatory Visit: Payer: Self-pay | Admitting: Family Medicine

## 2017-09-16 ENCOUNTER — Telehealth: Payer: Self-pay

## 2017-09-16 NOTE — Telephone Encounter (Signed)
Called patient and made appointment per Dr. [name] for 10/20/2017 @1430 .Ozella Almond, CMA

## 2017-09-22 ENCOUNTER — Telehealth: Payer: Self-pay | Admitting: Podiatry

## 2017-09-22 ENCOUNTER — Encounter: Payer: Medicare HMO | Admitting: Family Medicine

## 2017-09-22 NOTE — Telephone Encounter (Signed)
Pt left voicemail stating the [facility] says we have his diabetic shoes for him.  I returned call and pt does not have voicemail set up. I will call again tomorrow but we are not authorized to dispense shoes for the [facility] in our office. They have contracts with specific companies that dispense for them.

## 2017-09-23 ENCOUNTER — Telehealth: Payer: Self-pay | Admitting: Podiatry

## 2017-09-23 NOTE — Telephone Encounter (Signed)
Tried calling Erin back to let her know that Adam Ray had re-faxed the information to Jim's attention and that she had already faxed it to that fax number to a Jeneen Rinks. Phone just rang and rang and rang with no answer/answering machine.

## 2017-09-23 NOTE — Telephone Encounter (Signed)
Please let him know that unfortunately  Due to insurance we cannot do them. Please make sure everything is faxed to them and make sure they get it? Did the shoes get ordered from [place]??

## 2017-09-23 NOTE — Telephone Encounter (Signed)
Pt called and I explained that [facility] does not allow [place] to dispense shoes.He understood and is going to call [facility]

## 2017-09-23 NOTE — Telephone Encounter (Signed)
Erin with [facility] called and stated that the pt was referred to [place] for treatment and that they received the notes from our office. She said it looked like we were dispensing the pt his diabetic shoes and inserts instead of their prosthetic department. Stated pt called wanting to know what was going on and was upset. I read the note where it said he was seen by Liliane Channel and measured for shoes and inserts.

## 2017-09-23 NOTE — Telephone Encounter (Signed)
Erin with [facility] called back wanting me to fax the office visit note to the prosthetics department. Fax to Clair Gulling at fax number [phone]. I told Junie Panning I would get that faxed over in just a few minutes.

## 2017-09-29 ENCOUNTER — Other Ambulatory Visit (INDEPENDENT_AMBULATORY_CARE_PROVIDER_SITE_OTHER): Payer: Self-pay | Admitting: Orthopedic Surgery

## 2017-10-13 ENCOUNTER — Other Ambulatory Visit: Payer: Self-pay

## 2017-10-16 ENCOUNTER — Other Ambulatory Visit: Payer: Self-pay | Admitting: Family Medicine

## 2017-10-16 MED ORDER — FUROSEMIDE 40 MG PO TABS: 40.0000 mg | ORAL_TABLET | Freq: Every day | ORAL | 0 refills | Status: DC

## 2017-10-20 ENCOUNTER — Ambulatory Visit (INDEPENDENT_AMBULATORY_CARE_PROVIDER_SITE_OTHER): Payer: Medicare HMO | Admitting: Family Medicine

## 2017-10-20 ENCOUNTER — Encounter: Payer: Self-pay | Admitting: Family Medicine

## 2017-10-20 ENCOUNTER — Other Ambulatory Visit: Payer: Self-pay

## 2017-10-20 VITALS — BP 118/80 | HR 68 | Temp 98.2°F | Ht 70.0 in | Wt 177.8 lb

## 2017-10-20 DIAGNOSIS — J449 Chronic obstructive pulmonary disease, unspecified: Secondary | ICD-10-CM | POA: Diagnosis not present

## 2017-10-20 DIAGNOSIS — Z794 Long term (current) use of insulin: Secondary | ICD-10-CM | POA: Diagnosis not present

## 2017-10-20 DIAGNOSIS — M7989 Other specified soft tissue disorders: Secondary | ICD-10-CM | POA: Diagnosis not present

## 2017-10-20 DIAGNOSIS — B370 Candidal stomatitis: Secondary | ICD-10-CM

## 2017-10-20 DIAGNOSIS — E1143 Type 2 diabetes mellitus with diabetic autonomic (poly)neuropathy: Secondary | ICD-10-CM | POA: Diagnosis not present

## 2017-10-20 MED ORDER — ALBUTEROL SULFATE HFA 108 (90 BASE) MCG/ACT IN AERS: 2.0000 | INHALATION_SPRAY | Freq: Four times a day (QID) | RESPIRATORY_TRACT | 0 refills | Status: DC | PRN

## 2017-10-20 NOTE — Patient Instructions (Signed)
Have [facility] fax records to [place]. Our fax # is [phone].  Refilled albuterol  See [facility] for your chronic issues (leg swelling, diabetes, etc). Come see [place] for acute issues (colds, infections, etc).  On your way out, schedule an appointment one morning to come back for fasting labs. Do not eat or drink anything other than water the morning of your lab appointment until after your labs are drawn.  Be well, Dr. [name]

## 2017-10-20 NOTE — Progress Notes (Signed)
Date of Visit: 10/20/2017   HPI:  Adam Ray presents for routine follow up. It's been a while since I've seen him. He gets the majority of his care for chronic medical issues at the [facility]. We have not been successful at getting any records regarding his care from the [facility].  Diabetes - being managed by physician at the [facility], who adjusts his insulin. Ether would like A1c checked today. No shortness of breath or chest pain. Last eye exam was in September. No records available from that.  Lower extremity edema - taking lasix 40mg  daily without much relief. Has chronic swelling of RLE. Has not seen Dr. Sharol Given, his orthopedist, in about a year. Does urinate on this dose of lasix.  meloxicam rx - I have been getting frequent requests for meloxicam refills on him. He is not sure if he takes this. He usually takes what was given to him by the [facility], which sounds like it was acetaminophen, takes for general aches and pains.[name] He is ok with stopping meloxicam to avoid harming his kidneys.  Tongue issue - treated previously with 2 weeks of diflucan for presumed thrush. Has was previously referred to ENT in [place] but it's not clear if he actually saw them for this. He wants another 2 week course of diflucan.  Albuterol refill - needs refill of albuterol inhaler. Uses not very often.  ROS: See HPI.  [place]: history of type 2 diabetes, tobacco abuse, bicuspid aortic valve, diabetic neuropathy, hypertension, COPD, diabetic retinopathy, genital warts  PHYSICAL EXAM: BP 118/80   Pulse 68   Temp 98.2 F (36.8 C) (Oral)   Ht 5\' 10"  (1.778 m)   Wt 177 lb 12.8 oz (80.6 kg)   SpO2 96%   BMI 25.51 kg/m  Gen: no acute distress, pleasant, cooperative, well appearing HEENT: normocephalic, atraumatic, moist mucous membranes. Tongue without obvious thrush, though has some chronic discoloration. Heart: regular rate and rhythm Lungs: clear to auscultation bilaterally, normal work of breathing  Neuro: alert, grossly nonfocal,  speech normal Ext: +1 edema RLE (chronic), minimal edema LLE.  ASSESSMENT/PLAN:  Health maintenance:  -past due for eye exam in our system, though likely this is because we can't get [facility] records. He reports getting it every Sept.  Type 2 diabetes mellitus with neurologic complication, with long-term current use of insulin ([place]) a1c to be checked with fasting lab appointment tomorrow. Patient gets all of his diabetes and general chronic condition care at the [facility], and I had a discussion with him today about how it is challenging for me to contribute to his diabetes care without access to his [facility] records, and having no knowledge of what his [facility] doctors are thinking regarding his care. After discussion, we decided that he would continue seeing the [facility] for all chronic medical issues, and would get refills from [facility] doctors for these issues. He will come to [place] for acute care issues (viruses, colds, etc). He was agreeable to this plan.  COPD, mild ([place]) Refill albuterol inhaler for as needed use  Leg swelling Chronic RLE swelling for the last year or so. No signs of DVT on exam. Likely venous insufficiency. He feels lasix is not helping, but I am hesitant to take him off this as he has seen cardiology at the [facility] and there may be a reason for why they want him to stay on it (as above, I have no access to their records). Continue lasix & follow up with [facility] doctor.  Thrush Appearance not consistent  with thrush today. Explained to patient that another 2 week course of diflucan is not appropriate due to risk of renal damage. Encouraged him to see specialist at the [facility] for this.  Stop meloxicam, no clear reason for use & patient takes tylenol regularly instead.  FOLLOW UP: Follow up as needed for acute issues, seeing [facility] for [facility].  [place]. [name], [place]

## 2017-10-21 ENCOUNTER — Other Ambulatory Visit: Payer: Medicare HMO

## 2017-10-21 DIAGNOSIS — E1143 Type 2 diabetes mellitus with diabetic autonomic (poly)neuropathy: Secondary | ICD-10-CM | POA: Diagnosis not present

## 2017-10-21 DIAGNOSIS — Z794 Long term (current) use of insulin: Secondary | ICD-10-CM | POA: Diagnosis not present

## 2017-10-21 LAB — POCT GLYCOSYLATED HEMOGLOBIN (HGB A1C): HEMOGLOBIN A1C: 11.1

## 2017-10-22 LAB — CMP14+EGFR
A/G RATIO: 1.7 (ref 1.2–2.2)
ALT: 18 IU/L (ref 0–44)
AST: 15 IU/L (ref 0–40)
Albumin: 3.9 g/dL (ref 3.6–4.8)
Alkaline Phosphatase: 123 IU/L — ABNORMAL HIGH (ref 39–117)
BILIRUBIN TOTAL: 0.3 mg/dL (ref 0.0–1.2)
BUN/Creatinine Ratio: 12 (ref 10–24)
BUN: 13 mg/dL (ref 8–27)
CHLORIDE: 100 mmol/L (ref 96–106)
CO2: 23 mmol/L (ref 20–29)
Calcium: 8.9 mg/dL (ref 8.6–10.2)
Creatinine, Ser: 1.13 mg/dL (ref 0.76–1.27)
GFR calc non Af Amer: 69 mL/min/{1.73_m2} (ref >59)
GFR, EST AFRICAN AMERICAN: 80 mL/min/{1.73_m2} (ref >59)
Globulin, Total: 2.3 g/dL (ref 1.5–4.5)
Glucose: 378 mg/dL — ABNORMAL HIGH (ref 65–99)
POTASSIUM: 4.5 mmol/L (ref 3.5–5.2)
Sodium: 137 mmol/L (ref 134–144)
TOTAL PROTEIN: 6.2 g/dL (ref 6.0–8.5)

## 2017-10-22 LAB — LIPID PANEL
CHOL/HDL RATIO: 3.2 ratio (ref 0.0–5.0)
Cholesterol, Total: 159 mg/dL (ref 100–199)
HDL: 50 mg/dL (ref >39)
LDL CALC: 65 mg/dL (ref 0–99)
TRIGLYCERIDES: 219 mg/dL — AB (ref 0–149)
VLDL Cholesterol Cal: 44 mg/dL — ABNORMAL HIGH (ref 5–40)

## 2017-10-23 NOTE — Assessment & Plan Note (Signed)
Refill albuterol inhaler for as needed use

## 2017-10-23 NOTE — Assessment & Plan Note (Signed)
Chronic RLE swelling for the last year or so. No signs of DVT on exam. Likely venous insufficiency. He feels lasix is not helping, but I am hesitant to take him off this as he has seen cardiology at the [facility] and there may be a reason for why they want him to stay on it (as above, I have no access to their records). Continue lasix & follow up with [facility] doctor.

## 2017-10-23 NOTE — Assessment & Plan Note (Signed)
a1c to be checked with fasting lab appointment tomorrow. Patient gets all of his diabetes and general chronic condition care at the [place], and I had a discussion with him today about how it is challenging for me to contribute to his diabetes care without access to his [place] records, and having no knowledge of what his [place] doctors are thinking regarding his care. After discussion, we decided that he would continue seeing the [place] for all chronic medical issues, and would get refills from [place] doctors for these issues. He will come to [place] for acute care issues (viruses, colds, etc). He was agreeable to this plan.

## 2017-10-23 NOTE — Assessment & Plan Note (Signed)
Appearance not consistent with thrush today. Explained to patient that another 2 week course of diflucan is not appropriate due to risk of renal damage. Encouraged him to see specialist at the [facility] for this.

## 2017-11-11 ENCOUNTER — Telehealth: Payer: Self-pay

## 2017-11-11 DIAGNOSIS — F411 Generalized anxiety disorder: Secondary | ICD-10-CM | POA: Diagnosis not present

## 2017-11-11 NOTE — Telephone Encounter (Signed)
Pt called nurse line asking for a refill of fluconazole for "yeast on his tongue." [facility] Call back [phone] Wallace Cullens, RN

## 2017-11-12 NOTE — Telephone Encounter (Signed)
Pt left another message on nurse line demanding refill of "yeast medicine for tongue" and that he needs it now. Call back [phone] Wallace Cullens, RN

## 2017-11-12 NOTE — Telephone Encounter (Signed)
I have discussed this with patient a few weeks ago at his appointment. Will not refill diflucan. He needs to see the [facility] ENT doctor about this. I am not able to call him right now - leaving the office but will call him in the morning.  [name], MD

## 2017-11-12 NOTE — Telephone Encounter (Signed)
Pt a 2nd time about the refill for his 'yeast.' Told pt one message has already been sent back to [place] but I would send another. Pt demanded to speak to [facility] right now to get this refill. Told him [name] was probably at the hospital today so he said he was calling the hospital and hung up.  Pt then called back a 3rd time and asked where [name] was at in the hospital and I told pt I do not know where exactly she was in the hospital. Pt then called me a liar and said he wanted a call from [place] today and hung up.

## 2017-11-13 NOTE — Telephone Encounter (Signed)
Called patient at [phone] (his mobile number as listed in chart) to discuss this request. No answer, no VM set up.  [name], MD

## 2017-11-14 ENCOUNTER — Ambulatory Visit: Payer: Medicare HMO | Admitting: Internal Medicine

## 2017-11-17 ENCOUNTER — Ambulatory Visit: Payer: Non-veteran care | Admitting: Podiatry

## 2017-11-25 DIAGNOSIS — F411 Generalized anxiety disorder: Secondary | ICD-10-CM | POA: Diagnosis not present

## 2017-11-27 DIAGNOSIS — F411 Generalized anxiety disorder: Secondary | ICD-10-CM | POA: Diagnosis not present

## 2017-12-15 DIAGNOSIS — R6889 Other general symptoms and signs: Secondary | ICD-10-CM | POA: Diagnosis not present

## 2017-12-19 ENCOUNTER — Ambulatory Visit (INDEPENDENT_AMBULATORY_CARE_PROVIDER_SITE_OTHER): Payer: Medicare HMO | Admitting: Podiatry

## 2017-12-19 ENCOUNTER — Encounter: Payer: Self-pay | Admitting: Podiatry

## 2017-12-19 DIAGNOSIS — L84 Corns and callosities: Secondary | ICD-10-CM | POA: Diagnosis not present

## 2017-12-19 DIAGNOSIS — B351 Tinea unguium: Secondary | ICD-10-CM

## 2017-12-19 DIAGNOSIS — E1142 Type 2 diabetes mellitus with diabetic polyneuropathy: Secondary | ICD-10-CM | POA: Diagnosis not present

## 2017-12-19 DIAGNOSIS — M79675 Pain in left toe(s): Secondary | ICD-10-CM

## 2017-12-19 DIAGNOSIS — M79674 Pain in right toe(s): Secondary | ICD-10-CM | POA: Diagnosis not present

## 2017-12-19 NOTE — Progress Notes (Signed)
Subjective: 62 y.o. AAM returns the office today for painful, elongated, thickened toenails which he cannot trim himself. Denies any redness or drainage around the nails.    He also has painful calluses and wants to have trimmed.    Inspection of the nails and the calluses as short as possible.  Has new diabetic shoes and inserts today.  He nor his wife voice  any acute changes since last appointment and no new complaints today. Denies any systemic complaints such as fevers, chills, nausea, vomiting.   He feels he is ready to get back in the workforce and his surgeon has cleared him for 4 hrs/day.    Objective: AAO 3, NAD Vascular: DP/PT pulses palpable, CRT less than 3 seconds   Neuro: Protective sensation decreased with Simms Weinstein monofilament, Achilles tendon reflex intact.    Dermatological: Toenails hypertrophic, dystrophic, elongated, brittle, discolored 9. There is tenderness overlying the nails 1-5 on the left and 2-5 on the right. There is no surrounding erythema or drainage along the nail sites.  Hyperkeratotic lesions present: A. stump of the right first metatarsal  B.  left medial first metatarsal phalangeal joint and the  C. distal right third toe and left third toe. D. Plantarmedial midfoot right foot   Upon debridement there is no underlying ulceration, drainage or any signs of infection noted.  Musculoskeletal: Hammertoes are present b/l No open lesions or pre-ulcerative lesions are identified. No other areas of tenderness bilateral lower extremities. No overlying edema, erythema, increased warmth. No pain with calf compression, swelling, warmth, erythema.  Assessment: 1.  Onychomycosis x 9 2. Callusesx 4 3. NIDDM with neuropathy 4. S/p amputation right hallux  Plan: 1. Toenails sharply debrided 9 without complication/bleeding. 2. Hyperkeratotic lesions were sharply debrided x4 without any complications or bleeding.    3. Discussed diabetes  and foot care. Patient instructions andout given on diabetic foot care. If there are any changes, to call the office immediately.  4.Follow-up in 9 weeks or sooner if any problems are to arise. In the meantime, encouraged to call the office with any questions, concerns, changes symptoms.

## 2017-12-19 NOTE — Patient Instructions (Signed)

## 2018-01-05 DIAGNOSIS — F411 Generalized anxiety disorder: Secondary | ICD-10-CM | POA: Diagnosis not present

## 2018-01-05 DIAGNOSIS — F4312 Post-traumatic stress disorder, chronic: Secondary | ICD-10-CM | POA: Diagnosis not present

## 2018-01-05 DIAGNOSIS — F3181 Bipolar II disorder: Secondary | ICD-10-CM | POA: Diagnosis not present

## 2018-01-13 DIAGNOSIS — R6889 Other general symptoms and signs: Secondary | ICD-10-CM | POA: Diagnosis not present

## 2018-02-10 DIAGNOSIS — R6889 Other general symptoms and signs: Secondary | ICD-10-CM | POA: Diagnosis not present

## 2018-02-23 ENCOUNTER — Ambulatory Visit (INDEPENDENT_AMBULATORY_CARE_PROVIDER_SITE_OTHER): Payer: Medicare HMO | Admitting: Podiatry

## 2018-02-23 ENCOUNTER — Encounter: Payer: Self-pay | Admitting: Podiatry

## 2018-02-23 ENCOUNTER — Ambulatory Visit: Payer: Non-veteran care | Admitting: Podiatry

## 2018-02-23 DIAGNOSIS — Q828 Other specified congenital malformations of skin: Secondary | ICD-10-CM

## 2018-02-23 DIAGNOSIS — B351 Tinea unguium: Secondary | ICD-10-CM | POA: Diagnosis not present

## 2018-02-23 DIAGNOSIS — M79674 Pain in right toe(s): Secondary | ICD-10-CM

## 2018-02-23 DIAGNOSIS — M79675 Pain in left toe(s): Secondary | ICD-10-CM

## 2018-02-23 DIAGNOSIS — E1142 Type 2 diabetes mellitus with diabetic polyneuropathy: Secondary | ICD-10-CM | POA: Diagnosis not present

## 2018-02-25 NOTE — Progress Notes (Signed)
Subjective: 62 y.o. returns the office today for painful, elongated, thickened toenails which he cannot trim himself. Denies any redness or drainage around the nails.  He also has painful calluses and wants to have trimmed. Denies any acute changes since last appointment and no new complaints today. Denies any systemic complaints such as fevers, chills, nausea, vomiting.   PCP: [name], MD   Objective: AAO 3, NAD DP/PT pulses palpable, CRT less than 3 seconds Protective sensation decreased with Simms Weinstein monofilament, Achilles tendon reflex intact.  Nails hypertrophic, dystrophic, elongated, brittle, discolored 9. There is tenderness overlying the nails 1-5 on the left and 2-5 on the right. There is no surrounding erythema or drainage along the nail sites. Hyperkeratotic lesions present at the stump of the first metatarsal on the right foot as well as left medial first metatarsal phalangeal joint and the distal right third toe and left third toe.  Upon debridement there is no underlying ulceration, drainage or any signs of infection noted. Hammertoes are present No open lesions or pre-ulcerative lesions are identified. No other areas of tenderness bilateral lower extremities. No overlying edema, erythema, increased warmth. No pain with calf compression, swelling, warmth, erythema.  Assessment: Patient presents with symptomatic onychomycosis; hyperkeratotic lesions requesting diabetic shoes  Plan: -Treatment options including alternatives, risks, complications were discussed -Nails sharply debrided 9 without complication/bleeding. -Hyperkeratotic lesions were sharply debrided x4 without any complications or bleeding.   -Discussed daily foot inspection. If there are any changes, to call the office immediately.  -Follow-up in 3 months or sooner if any problems are to arise. In the meantime, encouraged to call the office with any questions, concerns, changes  symptoms.  Celesta Gentile, DPM

## 2018-03-24 DIAGNOSIS — R6889 Other general symptoms and signs: Secondary | ICD-10-CM | POA: Diagnosis not present

## 2018-04-02 ENCOUNTER — Telehealth: Payer: Self-pay | Admitting: Podiatry

## 2018-04-02 NOTE — Telephone Encounter (Signed)
Please fax notes to [place], [phone]

## 2018-04-21 ENCOUNTER — Ambulatory Visit (INDEPENDENT_AMBULATORY_CARE_PROVIDER_SITE_OTHER): Payer: Medicare HMO | Admitting: Podiatry

## 2018-04-21 ENCOUNTER — Other Ambulatory Visit: Payer: Self-pay

## 2018-04-21 DIAGNOSIS — E1142 Type 2 diabetes mellitus with diabetic polyneuropathy: Secondary | ICD-10-CM

## 2018-04-21 DIAGNOSIS — L84 Corns and callosities: Secondary | ICD-10-CM | POA: Diagnosis not present

## 2018-04-21 DIAGNOSIS — B351 Tinea unguium: Secondary | ICD-10-CM

## 2018-04-21 DIAGNOSIS — M79675 Pain in left toe(s): Secondary | ICD-10-CM | POA: Diagnosis not present

## 2018-04-21 DIAGNOSIS — M79674 Pain in right toe(s): Secondary | ICD-10-CM

## 2018-04-21 DIAGNOSIS — Z89411 Acquired absence of right great toe: Secondary | ICD-10-CM

## 2018-04-21 NOTE — Patient Instructions (Addendum)
[place] for Aid and Attendance Application  Diagnoses for Visit on 04/21/2018: 1.  Onychomycosis x 9 2. Callusesx 4 3. NIDDM with neuropathy 4. S/p amputation right hallux

## 2018-04-27 ENCOUNTER — Ambulatory Visit: Payer: Non-veteran care | Admitting: Podiatry

## 2018-04-28 ENCOUNTER — Ambulatory Visit (INDEPENDENT_AMBULATORY_CARE_PROVIDER_SITE_OTHER): Payer: Medicare HMO | Admitting: Orthopedic Surgery

## 2018-04-28 DIAGNOSIS — B351 Tinea unguium: Secondary | ICD-10-CM

## 2018-04-28 DIAGNOSIS — I87323 Chronic venous hypertension (idiopathic) with inflammation of bilateral lower extremity: Secondary | ICD-10-CM | POA: Diagnosis not present

## 2018-04-28 DIAGNOSIS — L97411 Non-pressure chronic ulcer of right heel and midfoot limited to breakdown of skin: Secondary | ICD-10-CM | POA: Diagnosis not present

## 2018-04-29 ENCOUNTER — Encounter (INDEPENDENT_AMBULATORY_CARE_PROVIDER_SITE_OTHER): Payer: Self-pay | Admitting: Orthopedic Surgery

## 2018-04-29 ENCOUNTER — Telehealth: Payer: Self-pay | Admitting: Podiatry

## 2018-04-29 NOTE — Telephone Encounter (Signed)
Pt asked if I knew when he had his foot surgery. I reviewed Chart Reviews and 08/19/2017 office visit list of diagnosis stated History of Amputation. Pt asked if Dr. Arnoldo Morale was in the office and I told pt, Dr. Arnoldo Morale was not in our practices. I Googled Dr. Newman Pies and gave pt the their office number.

## 2018-04-29 NOTE — Progress Notes (Signed)
Office Visit Note   Patient: Adam Ray           Date of Birth: 06-17-1956           MRN: [id number] Visit Date: 04/28/2018              Requested by: [name], [place], [place] [address] PCP: [name], MD  No chief complaint on file.     HPI: Patient is a 62 year old gentleman who presents in follow-up for both lower extremities he has had a history of venous insufficiency.  He is status post a right first and second ray amputation has ulceration on the plantar aspect of the midfoot.  Assessment & Plan: Visit Diagnoses:  1. Onychomycosis   2. Idiopathic chronic venous hypertension of both lower extremities with inflammation   3. Midfoot skin ulcer, right, limited to breakdown of skin ([place])     Plan: The ulcer was debrided of skin and soft tissue recommended compression stockings for the venous stasis swelling he is currently wearing knee-high cotton socks without compression.    Discussed with the patient that due to his foot deformity the plantar ulceration and venous insufficiency, patient is disabled from an orthopedic standpoint from working.    Recommended patient obtain from the [place] new extra-depth shoes custom orthotics and a carbon plate.  Follow-up in 3 months for evaluation of his feet.  Follow-Up Instructions: Return in about 3 weeks (around 05/19/2018).   Ortho Exam  Patient is alert, oriented, no adenopathy, well-dressed, normal affect, normal respiratory effort. Examination patient has venous swelling with pitting edema both lower extremities without ulceration.  He does have diabetic insensate neuropathy without protective sensation.  He has palpable pulses.  He has an ulcer beneath the second metatarsal right foot.  After informed consent a 10 blade knife was used to debride the skin and soft tissue back to healthy viable tissue the ulcer is 3 cm in diameter 2 mm deep.  A Band-Aid was applied.  Imaging: No  results found. No images are attached to the encounter.  Labs: Lab Results  Component Value Date   HGBA1C 11.1 10/21/2017   HGBA1C 9.6 07/22/2017   HGBA1C 8.3 11/19/2016   ESRSEDRATE 8 01/29/2016   ESRSEDRATE 9 04/11/2012   CRP 2.6 (H) 01/29/2016   CRP 0.5 02/28/2014   REPTSTATUS 02/03/2016 FINAL 01/29/2016   REPTSTATUS 02/03/2016 FINAL 01/29/2016   GRAMSTAIN  04/11/2012    NO WBC SEEN RARE SQUAMOUS EPITHELIAL CELLS PRESENT MODERATE GRAM POSITIVE COCCI IN PAIRS RARE GRAM NEGATIVE RODS   GRAMSTAIN  04/11/2012    NO WBC SEEN RARE SQUAMOUS EPITHELIAL CELLS PRESENT MODERATE GRAM POSITIVE COCCI IN PAIRS RARE GRAM NEGATIVE RODS   CULT NO GROWTH 5 DAYS 01/29/2016   CULT NO GROWTH 5 DAYS 01/29/2016   LABORGA NO GROWTH 2 DAYS 07/19/2014     Lab Results  Component Value Date   ALBUMIN 3.9 10/21/2017   ALBUMIN 4.0 01/29/2017   ALBUMIN 3.6 01/28/2016    There is no height or weight on file to calculate BMI.  Orders:  No orders of the defined types were placed in this encounter.  No orders of the defined types were placed in this encounter.    Procedures: No procedures performed  Clinical Data: No additional findings.  ROS:  All other systems negative, except as noted in the HPI. Review of Systems  Objective: Vital Signs: There were no vitals taken for this visit.  Specialty Comments:  No specialty comments available.  PMFS History: Patient Active Problem List   Diagnosis Date Noted  . Gingival foreign body 07/22/2017  . Anxiety state 06/02/2017  . Coated tongue 04/03/2017  . Leg swelling 01/29/2017  . Status post amputation of toe of right foot ([place]) 09/24/2016  . Idiopathic chronic venous hypertension of both lower extremities with inflammation 09/24/2016  . Onychomycosis 09/24/2016  . Midfoot skin ulcer, right, limited to breakdown of skin ([place]) 09/24/2016  . Testicular mass 04/18/2016  . Thrush 04/18/2016  . Soft tissue swelling   . Osteomyelitis  ([place]) 01/29/2016  . Diabetic polyneuropathy associated with type 2 diabetes mellitus ([place])   . Type 2 diabetes mellitus with neurologic complication, with long-term current use of insulin ([place]) 12/08/2015  . Left shoulder pain 08/10/2015  . Pulmonary nodule 02/01/2015  . Screening for colon cancer 01/09/2015  . Lumbar back pain 03/21/2014  . Screening for STD (sexually transmitted disease) 12/15/2013  . Depression 12/09/2013  . Lightheadedness 11/19/2013  . Warts, genital 08/20/2013  . 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 Right leg pain 04/27/2013  . Diabetic retinopathy ([place]) 01/28/2013  . Visit for well man health check 11/19/2010  . Sleep apnea 09/06/2010  . 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 LEG EDEMA 03/03/2009  . Essential hypertension 11/09/2008  . COPD, mild ([place]) 10/06/2006  . SICKLE-CELL TRAIT 04/26/2006  . ERECTILE DYSFUNCTION 04/26/2006  . Tobacco abuse 04/26/2006  . Peripheral vascular disease ([place]) 04/26/2006  . ALLERGIC RHINITIS 04/26/2006   Past Medical History:  Diagnosis Date  . Allergy   . Arthritis   . Bronchitis   . Cataract   . Chronic kidney disease   . Claudication [facility])    right foot ray resection  . Colon polyps    hyperplastic  . COPD (chronic obstructive pulmonary disease) ([place])   . Diabetes mellitus   . Genital warts   . Gout   . Hyperlipidemia   . Hypertension     Family History  Problem Relation Age of Onset  . Diabetes Mother   . Stroke Mother   . Heart failure Father   . Colon cancer Neg Hx   . Esophageal cancer Neg Hx   . Rectal cancer Neg Hx   . Stomach cancer Neg Hx     Past Surgical History:  Procedure Laterality Date  . AMPUTATION Right 01/31/2016   Procedure: Right 2nd Toe Amputation;  Surgeon: Newt Minion, MD;  Location: [facility];  Service: Orthopedics;  Laterality: Right;  . CATARACT EXTRACTION     right eye  . COLONOSCOPY    . I&D EXTREMITY  04/11/2012   Procedure: IRRIGATION AND  DEBRIDEMENT EXTREMITY;  Surgeon: Wylene Simmer, MD;  Location: [facility];  Service: Orthopedics;  Laterality: Right;  . Surgery left great toe    . Tear ducts bilateral eyes     Social History   Occupational History  . Occupation: disabled  Tobacco Use  . Smoking status: Former Smoker    Packs/day: 0.30    Years: 48.00    Pack years: 14.40    Types: Cigars    Start date: 07/02/1963  . Smokeless tobacco: Former Systems developer  . Tobacco comment: wants to use electric cigarettes.  not interested in pills, worried about chantix side effects  Substance and Sexual Activity  . Alcohol use: Yes    Alcohol/week: 0.0 standard drinks    Comment: occassional use  . Drug use: No  . Sexual activity: Not on  file

## 2018-04-29 NOTE — Telephone Encounter (Signed)
Pt called back and stated he wanted Dr. Elisha Ponder to call him. I told pt Dr. Elisha Ponder was seeing pt and I would pass a message to her. Pt states he has to talk to Dr. Elisha Ponder and she knows what it is about.

## 2018-04-29 NOTE — Telephone Encounter (Signed)
Please give pt a call, stating that it is Important but will not give any information

## 2018-04-29 NOTE — Telephone Encounter (Signed)
left message requesting pt call with his concerns, if I was not available leave a message and I would call again.

## 2018-04-30 NOTE — Telephone Encounter (Signed)
Called patient back. He has application from [employer] for Aid and [employer]. I instructed him to make copies of physician's statement and drop a blank form off for me to complete for him.I told him it would take me some time to complete it and we would call him back when it's complete for him to pick up. We can also mail it back to him if he'd like.  I also instructed him to give a copy to his PCP and the doctor who performed his amputation.  He related understanding of our phone call.

## 2018-05-03 ENCOUNTER — Encounter: Payer: Self-pay | Admitting: Podiatry

## 2018-05-03 NOTE — Progress Notes (Signed)
Subjective: Adam Ray is a 62 y.o. y.o. male who presents today for at risk foot care.   He has history of amputation of right great toe and multiple calluses.  He continues to be determined to return to work 4 hours/day.   Objective: Vascular Examination: Capillary refill time <3 seconds x 10 digits Dorsalis pedis and Posterior tibial pulses present b/l No digital hair x 10 digits   Dermatological Examination: Skin with normal turgor, texture and tone b/l  Toenails 1-5 left, 2-5 right  discolored, thick, dystrophic with subungual debris and pain with palpation to nailbeds due to thickness of nails.  Hyperkeratotic lesion stump of right first metatarsal, left medial first MPJ, distal tip right 3rd toe, distal tip left 3rd digit, plantarmedial midfoot right foot. No underlying ulceration, no flocculence, no edema, no erythema. No underlying abscess or signs of infection noted.  Musculoskeletal: Muscle strength 5/5 to all LE muscle groups Hammertoes 2-5 b/l   Neurological: Sensation diminished with 10 gram monofilament.  Assessment: 1. Painful onychomycosis toenails  1-5 left, 2-5 right  2.  Callus right first metatarsal, left medial first MPJ, distal tip right 3rd toe, distal tip left 3rd digit, plantarmedial midfoot right foot. 3.  NIDDM with neuropathy 4. Status post amputation right hallux  Plan: 1. Continue diabetic shoes 2. Toenails1-5 left, 2-5 right  were debrided in length and girth without iatrogenic bleeding. 3. Hyperkeratotic lesion pared calluses right first metatarsal, left medial first MPJ, distal tip right 3rd toe, distal tip left 3rd digit, plantarmedial midfoot right foot 4. Patient to report any pedal injuries to medical professional  5. Discussed Veterans Aid and Attendance Benefits for which he may qualify. He was advised to request application and I would complete Physician's statement for him. 6. Follow up 9 weeks. Patient/POA to call should there be a  concern in the interim.

## 2018-05-11 ENCOUNTER — Other Ambulatory Visit: Payer: Self-pay

## 2018-05-11 ENCOUNTER — Ambulatory Visit (INDEPENDENT_AMBULATORY_CARE_PROVIDER_SITE_OTHER): Payer: Medicare HMO | Admitting: Family Medicine

## 2018-05-11 ENCOUNTER — Encounter: Payer: Self-pay | Admitting: Family Medicine

## 2018-05-11 VITALS — BP 134/72 | HR 62 | Temp 98.4°F | Ht 70.0 in | Wt 179.2 lb

## 2018-05-11 DIAGNOSIS — Z7409 Other reduced mobility: Secondary | ICD-10-CM

## 2018-05-11 DIAGNOSIS — Z23 Encounter for immunization: Secondary | ICD-10-CM

## 2018-05-11 NOTE — Patient Instructions (Signed)
Filled out paperwork for the VA Follow up as needed.  [name], MD

## 2018-05-11 NOTE — Progress Notes (Signed)
Date of Visit: 05/11/2018   HPI:  Patient presents for form completion. He is applying for extra benefits with the VA and needs a form completed saying that he requires assistance with some of his ADL's.  Patient and wife report he needs assistance with some grooming and dressing activities. He has difficulty reaching behind to wash his back and his posterior neck and head. He is able to feed himself but his wife assists with preparing food. Able to ambulate only to the mailbox and back. Occasionally uses a cane. He is s/p first and second ray amputations on the right foot and currently has a plantar medial ulcer on the right foot which is undergoing care by his orthopedist Dr. Sharol Given, who has told patient he cannot work or he risks losing his foot. Patient estimates he can stand upright for maximum of an hour at a time without pain in the foot. He limps on the R foot when he walks.  He is [facility] at the [facility] for his diabetes and other chronic medical problems. He would like a flu shot today.  ROS: See HPI.  [place]: history of COPD, type 2 diabetes, tobacco abuse, bicuspid aortic valve, diabetic polyneuropathy, hypertension, diabetic retinopathy  PHYSICAL EXAM: BP 134/72   Pulse 62   Temp 98.4 F (36.9 C) (Oral)   Ht 5\' 10"  (1.778 m)   Wt 179 lb 3.2 oz (81.3 kg)   SpO2 97%   BMI 25.71 kg/m  Gen: no acute distress, pleasant and cooperative HEENT: normocephalic, atraumatic  Lungs: normal respiratory effort Neuro: speech normal Ext:  Upper extremities: decreased ROM of R shoulder with reaching behind head. Grip 5/5 bilaterally. Shoulder abduction 5/5 bilaterally. Lower extremities: s/p first and second ray amputations of R foot. Antalgic gait, favors right foot. Medial midfoot with ulcer that is approximately 3 cm in diameter. Limited ROM of R ankle due to chronic swelling. Patient has great difficulty putting his socks on by himself.  ASSESSMENT/PLAN:  Health maintenance:  -flu shot  given today -receives diabetes HM care at [facility] mobility Patient does require assistance with ADLs (bathing, dressing himself). Form completed for his application for additional VA benefits documenting his physical exam and functional impairments. Copy of form to be scanned into chart.  FOLLOW UP: Follow up as needed - continue seeing [place] doctors for chronic medical conditions.  [place]. [name], [place]

## 2018-05-12 DIAGNOSIS — Z7409 Other reduced mobility: Secondary | ICD-10-CM | POA: Insufficient documentation

## 2018-05-12 NOTE — Assessment & Plan Note (Signed)
Patient does require assistance with ADLs (bathing, dressing himself). Form completed for his application for additional VA benefits documenting his physical exam and functional impairments. Copy of form to be scanned into chart.

## 2018-05-13 ENCOUNTER — Ambulatory Visit (INDEPENDENT_AMBULATORY_CARE_PROVIDER_SITE_OTHER): Payer: Non-veteran care | Admitting: Orthopedic Surgery

## 2018-05-21 ENCOUNTER — Ambulatory Visit (INDEPENDENT_AMBULATORY_CARE_PROVIDER_SITE_OTHER): Payer: Non-veteran care | Admitting: Orthopedic Surgery

## 2018-06-05 ENCOUNTER — Telehealth (INDEPENDENT_AMBULATORY_CARE_PROVIDER_SITE_OTHER): Payer: Self-pay | Admitting: *Deleted

## 2018-06-05 NOTE — Telephone Encounter (Signed)
Done and faxed

## 2018-06-09 ENCOUNTER — Telehealth (INDEPENDENT_AMBULATORY_CARE_PROVIDER_SITE_OTHER): Payer: Self-pay | Admitting: Orthopedic Surgery

## 2018-06-09 NOTE — Telephone Encounter (Signed)
Will you please check on this pts VA auth

## 2018-06-09 NOTE — Telephone Encounter (Signed)
Called and lm on vm to advise pt to call the office back and advise what auth he is needing for the [place]. What is the name of the office he is trying to go and what is the service that he is wanting to get there. I will hold onto this message pending return call from the pt.

## 2018-06-11 DIAGNOSIS — S90414A Abrasion, right lesser toe(s), initial encounter: Secondary | ICD-10-CM | POA: Diagnosis not present

## 2018-06-12 ENCOUNTER — Telehealth (INDEPENDENT_AMBULATORY_CARE_PROVIDER_SITE_OTHER): Payer: Self-pay | Admitting: Orthopedic Surgery

## 2018-06-12 NOTE — Telephone Encounter (Signed)
Patient called back and stated he is trying to come here to [place] to see Dr. Sharol Given for a knot of some sort in the center of bottom right foot.  Please call patient to advise.  [phone]

## 2018-06-15 NOTE — Telephone Encounter (Signed)
This has been done.

## 2018-06-15 NOTE — Telephone Encounter (Signed)
Called pt and lm on vm to advise that form has been completed for eval of his right foot and faxed to the [place] today. To call with any other questions.

## 2018-06-17 ENCOUNTER — Telehealth (INDEPENDENT_AMBULATORY_CARE_PROVIDER_SITE_OTHER): Payer: Self-pay

## 2018-06-17 NOTE — Telephone Encounter (Signed)
Per PCP request NO VA form faxed to ATT: Judithann Sauger [phone] and to [phone]

## 2018-06-20 ENCOUNTER — Encounter (HOSPITAL_COMMUNITY): Payer: Self-pay

## 2018-06-20 ENCOUNTER — Ambulatory Visit (HOSPITAL_COMMUNITY)
Admission: EM | Admit: 2018-06-20 | Discharge: 2018-06-20 | Disposition: A | Discharge status: Home / Self Care | Payer: Medicare HMO | Attending: Family Medicine | Admitting: Family Medicine

## 2018-06-20 ENCOUNTER — Ambulatory Visit (INDEPENDENT_AMBULATORY_CARE_PROVIDER_SITE_OTHER): Payer: Medicare HMO

## 2018-06-20 DIAGNOSIS — R05 Cough: Secondary | ICD-10-CM | POA: Diagnosis not present

## 2018-06-20 DIAGNOSIS — B9789 Other viral agents as the cause of diseases classified elsewhere: Secondary | ICD-10-CM | POA: Diagnosis not present

## 2018-06-20 DIAGNOSIS — J069 Acute upper respiratory infection, unspecified: Secondary | ICD-10-CM | POA: Insufficient documentation

## 2018-06-20 NOTE — Discharge Instructions (Addendum)
There were no signs of pneumonia on your chest x-ray this evening.  Follow up with your primary care doctor or here if you are not seeing improvement of your symptoms over the next several days, sooner if you feel you are worsening.  Caring for yourself: Get plenty of rest. Drink plenty of fluids, enough so that your urine is light yellow or clear like water. If you have kidney, heart, or liver disease and have to limit fluids, talk with your doctor before you increase the amount of fluids you drink. Take an over-the-counter pain medicine if needed, such as acetaminophen (Tylenol), ibuprofen (Advil, Motrin), or naproxen (Aleve), to relieve fever, headache, and muscle aches. Read and follow all instructions on the label. If the skin around your nose and lips becomes sore, put some petroleum jelly on the area.  Avoid spreading a respiratory virus: Wash your hands regularly, and keep your hands away from your face.  Stay home from school, work, and other public places until you are feeling better and your fever has been gone for at least 24 hours. The fever needs to have gone away on its own without the help of medicine.

## 2018-06-20 NOTE — ED Triage Notes (Signed)
Pt presents with cough, chest tightness and chills.

## 2018-06-22 ENCOUNTER — Telehealth (INDEPENDENT_AMBULATORY_CARE_PROVIDER_SITE_OTHER): Payer: Self-pay | Admitting: Orthopedic Surgery

## 2018-06-22 DIAGNOSIS — R6889 Other general symptoms and signs: Secondary | ICD-10-CM | POA: Diagnosis not present

## 2018-06-22 NOTE — Telephone Encounter (Signed)
refaxed with ov notes the  "non-VA care referral form"

## 2018-06-22 NOTE — ED Provider Notes (Signed)
[place]   [id number] 06/20/18 Arrival Time: 1751  ASSESSMENT & PLAN:  1. Viral URI with cough    Overall, he looks good. As he and his wife voiced concern over fear of pneumonia, I reassured them.  I have personally viewed the imaging studies ordered this visit; no signs of pneumonia. I am not concerned for any other bacterial infection.  Discussed typical duration of symptoms concerning likely viral URI. Prefers OTC symptom care as needed. Ensure adequate fluid intake and rest. May f/u with PCP or here as needed.  Reviewed expectations re: course of current medical issues. Questions answered. Outlined signs and symptoms indicating need for more acute intervention. Patient verbalized understanding. After Visit Summary given.   SUBJECTIVE: History from: patient.  OSBORNE SERIO is a 62 y.o. male who presents with complaint of nasal congestion, post-nasal drainage, and a persistent dry cough; in triage he reported "chest tightness" but clarifies this is only while he is coughing and without associated SOB. Former smoker. No sore throat. Onset abrupt, over the past 2-3 days. Overall with mild fatigue and without body aches. Wheezing: none. Fever: none reported; "have felt a little chilled". Wife reports no confusion or mental status changes. Overall normal PO intake without n/v. Sick contacts: no. No specific or significant aggravating or alleviating factors reported. OTC treatment: none reported.  Received flu shot this year: yes.  Reviewed: Immunization History  Administered Date(s) Administered  . Influenza Split 06/11/2011  . Influenza Whole 06/01/2008, 04/28/2009  . Influenza,inj,Quad PF,6+ Mos 04/06/2013, 03/25/2014, 06/12/2015, 03/15/2016, 03/21/2017, 05/11/2018  . Influenza-Unspecified 03/28/2012  . Pneumococcal Conjugate-13 06/12/2015  . Pneumococcal Polysaccharide-23 06/01/2009, 03/15/2016  . Td 08/23/2008  . Zoster 09/15/2015   Social History    Tobacco Use  Smoking Status Former Smoker  . Packs/day: 0.30  . Years: 48.00  . Pack years: 14.40  . Types: Cigars  . Start date: 07/02/1963  Smokeless Tobacco Former User  Tobacco Comment   wants to use electric cigarettes.  not interested in pills, worried about chantix side effects   ROS: As per HPI. All other systems negative.   OBJECTIVE:  Vitals:   06/20/18 1552  BP: 135/71  Pulse: 68  Resp: 18  Temp: 98.8 F (37.1 C)  TempSrc: Oral  SpO2: 99%    General appearance: alert HEENT: nasal congestion; clear runny nose; throat irritation secondary to post-nasal drainage Neck: supple without LAD CV: RRR Lungs: unlabored respirations, symmetrical air entry without wheezing; cough: mild Abd: soft; non-tender Ext: no LE edema Skin: warm and dry Psychological: alert and cooperative; normal mood and affect  Imaging: Dg Chest 2 View  Result Date: 06/20/2018 CLINICAL DATA:  Cough EXAM: CHEST - 2 VIEW COMPARISON:  11/17/2017 FINDINGS: The heart size and mediastinal contours are within normal limits. Both lungs are clear. Degenerative changes of the spine. IMPRESSION: No active cardiopulmonary disease. Electronically Signed   By: Donavan Foil M.D.   On: 06/20/2018 16:50    No Known Allergies  Past Medical History:  Diagnosis Date  . Allergy   . Arthritis   . Bronchitis   . Cataract   . Chronic kidney disease   . Claudication [facility])    right foot ray resection  . Colon polyps    hyperplastic  . COPD (chronic obstructive pulmonary disease) ([place])   . Diabetes mellitus   . Genital warts   . Gout   . Hyperlipidemia   . Hypertension    Family History  Problem Relation Age of  Onset  . Diabetes Mother   . Stroke Mother   . Heart failure Father   . Colon cancer Neg Hx   . Esophageal cancer Neg Hx   . Rectal cancer Neg Hx   . Stomach cancer Neg Hx    Social History   Socioeconomic History  . Marital status: Married    Spouse name: Not on file  . Number of  children: 3  . Years of education: Not on file  . Highest education level: Not on file  Occupational History  . Occupation: disabled  Social Needs  . Financial resource strain: Not on file  . Food insecurity:    Worry: Not on file    Inability: Not on file  . Transportation needs:    Medical: Not on file    Non-medical: Not on file  Tobacco Use  . Smoking status: Former Smoker    Packs/day: 0.30    Years: 48.00    Pack years: 14.40    Types: Cigars    Start date: 07/02/1963  . Smokeless tobacco: Former Systems developer  . Tobacco comment: wants to use electric cigarettes.  not interested in pills, worried about chantix side effects  Substance and Sexual Activity  . Alcohol use: Yes    Alcohol/week: 0.0 standard drinks    Comment: occassional use  . Drug use: No  . Sexual activity: Not on file  Lifestyle  . Physical activity:    Days per week: Not on file    Minutes per session: Not on file  . Stress: Not on file  Relationships  . Social connections:    Talks on phone: Not on file    Gets together: Not on file    Attends religious service: Not on file    Active member of club or organization: Not on file    Attends meetings of clubs or organizations: Not on file    Relationship status: Not on file  . Intimate partner violence:    Fear of current or ex partner: Not on file    Emotionally abused: Not on file    Physically abused: Not on file    Forced sexual activity: Not on file  Other Topics Concern  . Not on file  Social History Narrative  . Not on file           Vanessa Kick, MD 06/22/18 [phone]

## 2018-06-25 ENCOUNTER — Ambulatory Visit (INDEPENDENT_AMBULATORY_CARE_PROVIDER_SITE_OTHER): Payer: PRIVATE HEALTH INSURANCE

## 2018-06-25 ENCOUNTER — Ambulatory Visit (INDEPENDENT_AMBULATORY_CARE_PROVIDER_SITE_OTHER): Payer: Medicare HMO | Admitting: Podiatry

## 2018-06-25 ENCOUNTER — Other Ambulatory Visit: Payer: Self-pay

## 2018-06-25 ENCOUNTER — Emergency Department (HOSPITAL_COMMUNITY)
Admission: EM | Admit: 2018-06-25 | Discharge: 2018-06-25 | Disposition: A | Discharge status: Home / Self Care | Payer: Medicare HMO | Attending: Emergency Medicine | Admitting: Emergency Medicine

## 2018-06-25 ENCOUNTER — Encounter (HOSPITAL_COMMUNITY): Payer: Self-pay

## 2018-06-25 ENCOUNTER — Encounter: Payer: Self-pay | Admitting: Podiatry

## 2018-06-25 ENCOUNTER — Ambulatory Visit (INDEPENDENT_AMBULATORY_CARE_PROVIDER_SITE_OTHER): Payer: PRIVATE HEALTH INSURANCE | Admitting: Physician Assistant

## 2018-06-25 ENCOUNTER — Encounter (INDEPENDENT_AMBULATORY_CARE_PROVIDER_SITE_OTHER): Payer: Self-pay | Admitting: Physician Assistant

## 2018-06-25 DIAGNOSIS — Z5321 Procedure and treatment not carried out due to patient leaving prior to being seen by health care provider: Secondary | ICD-10-CM | POA: Diagnosis not present

## 2018-06-25 DIAGNOSIS — M86171 Other acute osteomyelitis, right ankle and foot: Secondary | ICD-10-CM | POA: Diagnosis not present

## 2018-06-25 DIAGNOSIS — L03119 Cellulitis of unspecified part of limb: Secondary | ICD-10-CM | POA: Diagnosis not present

## 2018-06-25 DIAGNOSIS — E1142 Type 2 diabetes mellitus with diabetic polyneuropathy: Secondary | ICD-10-CM

## 2018-06-25 DIAGNOSIS — I739 Peripheral vascular disease, unspecified: Secondary | ICD-10-CM

## 2018-06-25 DIAGNOSIS — R2241 Localized swelling, mass and lump, right lower limb: Secondary | ICD-10-CM | POA: Insufficient documentation

## 2018-06-25 DIAGNOSIS — I87323 Chronic venous hypertension (idiopathic) with inflammation of bilateral lower extremity: Secondary | ICD-10-CM

## 2018-06-25 DIAGNOSIS — L97414 Non-pressure chronic ulcer of right heel and midfoot with necrosis of bone: Secondary | ICD-10-CM | POA: Diagnosis not present

## 2018-06-25 LAB — CBC
HCT: 40.7 % (ref 39.0–52.0)
Hemoglobin: 13.4 g/dL (ref 13.0–17.0)
MCH: 29.8 pg (ref 26.0–34.0)
MCHC: 32.9 g/dL (ref 30.0–36.0)
MCV: 90.4 fL (ref 80.0–100.0)
NRBC: 0 % (ref 0.0–0.2)
PLATELETS: 171 10*3/uL (ref 150–400)
RBC: 4.5 MIL/uL (ref 4.22–5.81)
RDW: 13.1 % (ref 11.5–15.5)
WBC: 5 10*3/uL (ref 4.0–10.5)

## 2018-06-25 LAB — BASIC METABOLIC PANEL
ANION GAP: 3 — AB (ref 5–15)
BUN: 14 mg/dL (ref 8–23)
CO2: 33 mmol/L — ABNORMAL HIGH (ref 22–32)
Calcium: 8.9 mg/dL (ref 8.9–10.3)
Chloride: 103 mmol/L (ref 98–111)
Creatinine, Ser: 1.27 mg/dL — ABNORMAL HIGH (ref 0.61–1.24)
GFR calc Af Amer: >60 mL/min (ref >60)
GFR, EST NON AFRICAN AMERICAN: 60 mL/min — AB (ref >60)
Glucose, Bld: 125 mg/dL — ABNORMAL HIGH (ref 70–99)
POTASSIUM: 4.2 mmol/L (ref 3.5–5.1)
SODIUM: 139 mmol/L (ref 135–145)

## 2018-06-25 LAB — PROTIME-INR
INR: 0.98
Prothrombin Time: 12.9 seconds (ref 11.4–15.2)

## 2018-06-25 NOTE — Patient Instructions (Signed)
Diabetes Mellitus and Foot Care °Foot care is an important part of your health, especially when you have diabetes. Diabetes may cause you to have problems because of poor blood flow (circulation) to your feet and legs, which can cause your skin to: °· Become thinner and drier. °· Break more easily. °· Heal more slowly. °· Peel and crack. °You may also have nerve damage (neuropathy) in your legs and feet, causing decreased feeling in them. This means that you may not notice minor injuries to your feet that could lead to more serious problems. Noticing and addressing any potential problems early is the best way to prevent future foot problems. °How to care for your feet °Foot hygiene °· Wash your feet daily with warm water and mild soap. Do not use hot water. Then, pat your feet and the areas between your toes until they are completely dry. Do not soak your feet as this can dry your skin. °· Trim your toenails straight across. Do not dig under them or around the cuticle. File the edges of your nails with an emery board or nail file. °· Apply a moisturizing lotion or petroleum jelly to the skin on your feet and to dry, brittle toenails. Use lotion that does not contain alcohol and is unscented. Do not apply lotion between your toes. °Shoes and socks °· Wear clean socks or stockings every day. Make sure they are not too tight. Do not wear knee-high stockings since they may decrease blood flow to your legs. °· Wear shoes that fit properly and have enough cushioning. Always look in your shoes before you put them on to be sure there are no objects inside. °· To break in new shoes, wear them for just a few hours a day. This prevents injuries on your feet. °Wounds, scrapes, corns, and calluses °· Check your feet daily for blisters, cuts, bruises, sores, and redness. If you cannot see the bottom of your feet, use a mirror or ask someone for help. °· Do not cut corns or calluses or try to remove them with medicine. °· If you  find a minor scrape, cut, or break in the skin on your feet, keep it and the skin around it clean and dry. You may clean these areas with mild soap and water. Do not clean the area with peroxide, alcohol, or iodine. °· If you have a wound, scrape, corn, or callus on your foot, look at it several times a day to make sure it is healing and not infected. Check for: °? Redness, swelling, or pain. °? Fluid or blood. °? Warmth. °? Pus or a bad smell. °General instructions °· Do not cross your legs. This may decrease blood flow to your feet. °· Do not use heating pads or hot water bottles on your feet. They may burn your skin. If you have lost feeling in your feet or legs, you may not know this is happening until it is too late. °· Protect your feet from hot and cold by wearing shoes, such as at the beach or on hot pavement. °· Schedule a complete foot exam at least once a year (annually) or more often if you have foot problems. If you have foot problems, report any cuts, sores, or bruises to your health care provider immediately. °Contact a health care provider if: °· You have a medical condition that increases your risk of infection and you have any cuts, sores, or bruises on your feet. °· You have an injury that is not   healing. °· You have redness on your legs or feet. °· You feel burning or tingling in your legs or feet. °· You have pain or cramps in your legs and feet. °· Your legs or feet are numb. °· Your feet always feel cold. °· You have pain around a toenail. °Get help right away if: °· You have a wound, scrape, corn, or callus on your foot and: °? You have pain, swelling, or redness that gets worse. °? You have fluid or blood coming from the wound, scrape, corn, or callus. °? Your wound, scrape, corn, or callus feels warm to the touch. °? You have pus or a bad smell coming from the wound, scrape, corn, or callus. °? You have a fever. °? You have a red line going up your leg. °Summary °· Check your feet every day  for cuts, sores, red spots, swelling, and blisters. °· Moisturize feet and legs daily. °· Wear shoes that fit properly and have enough cushioning. °· If you have foot problems, report any cuts, sores, or bruises to your health care provider immediately. °· Schedule a complete foot exam at least once a year (annually) or more often if you have foot problems. °This information is not intended to replace advice given to you by your health care provider. Make sure you discuss any questions you have with your health care provider. °Document Released: 06/14/2000 Document Revised: 07/30/2017 Document Reviewed: 07/19/2016 °Elsevier Interactive Patient Education © 2019 [employer] ° °Diabetic Neuropathy °Diabetic neuropathy refers to nerve damage that is caused by diabetes (diabetes mellitus). Over time, people with diabetes can develop nerve damage throughout the body. There are several types of diabetic neuropathy: °· Peripheral neuropathy. This is the most common type of diabetic neuropathy. It causes damage to nerves that carry signals between the spinal cord and other parts of the body (peripheral nerves). This usually affects nerves in the feet and legs first, and may eventually affect the hands and arms. The damage affects the ability to sense touch or temperature. °· Autonomic neuropathy. This type causes damage to nerves that control involuntary functions (autonomic nerves). These nerves carry signals that control: °? Heartbeat. °? Body temperature. °? Blood pressure. °? Urination. °? Digestion. °? Sweating. °? Sexual function. °? Response to changing blood sugar (glucose) levels. °· Focal neuropathy. This type of nerve damage affects one area of the body, such as an arm, a leg, or the face. The injury may involve one nerve or a small group of nerves. Focal neuropathy can be painful and unpredictable, and occurs most often in older adults with diabetes. This often develops suddenly, but usually improves over time  and does not cause long-term problems. °· Proximal neuropathy. This type of nerve damage affects the nerves of the thighs, hips, buttocks, or legs. It causes severe pain, weakness, and muscle death (atrophy), usually in the thigh muscles. It is more common among older men and people who have type 2 diabetes. The length of recovery time may vary. °What are the causes? °Peripheral, autonomic, and focal neuropathies are caused by diabetes that is not well controlled with treatment. The cause of proximal neuropathy is not known, but it may be caused by inflammation related to uncontrolled blood glucose levels. °What are the signs or symptoms? °Peripheral neuropathy °Peripheral neuropathy develops slowly over time. When the nerves of the feet and legs no longer work, you may experience: °· Burning, stabbing, or aching pain in the legs or feet. °· Pain or cramping in the   legs or feet. °· Loss of feeling (numbness) and inability to feel pressure or pain in the feet. This can lead to: °? Thick calluses or sores on areas of constant pressure. °? Ulcers. °? Reduced ability to feel temperature changes. °· Foot deformities. °· Muscle weakness. °· Loss of balance or coordination. °Autonomic neuropathy °The symptoms of autonomic neuropathy vary depending on which nerves are affected. Symptoms may include: °· Problems with digestion, such as: °? Nausea or vomiting. °? Poor appetite. °? Bloating. °? Diarrhea or constipation. °? Trouble swallowing. °? Losing weight without trying to. °· Problems with the heart, blood and lungs, such as: °? Dizziness, especially when standing up. °? Fainting. °? Shortness of breath. °? Irregular heartbeat. °· Bladder problems, such as: °? Trouble starting or stopping urination. °? Leaking urine. °? Trouble emptying the bladder. °? Urinary tract infections (UTIs). °· Problems with other body functions, such as: °? Sweat. You may sweat too much or too little. °? Temperature. You might get hot easily.  Or, you might feel cold more than usual. °? Sexual function. Men may not be able to get or maintain an erection. Women may have vaginal dryness and difficulty with arousal. °Focal neuropathy °Symptoms affect only one area of the body. Common symptoms include: °· Numbness. °· Tingling. °· Burning pain. °· Prickling feeling. °· Very sensitive skin. °· Weakness. °· Inability to move (paralysis). °· Muscle twitching. °· Muscles getting smaller (wasting). °· Poor coordination. °· Double or blurred vision. °Proximal neuropathy °· Sudden, severe pain in the hip, thigh, or buttocks. Pain may spread from the back into the legs (sciatica). °· Pain and numbness in the arms and legs. °· Tingling. °· Loss of bladder control or bowel control. °· Weakness and wasting of thigh muscles. °· Difficulty getting up from a seated position. °· Abdominal swelling. °· Unexplained weight loss. °How is this diagnosed? °Diagnosis usually involves reviewing your medical history and any symptoms you have. Diagnosis varies depending on the type of neuropathy your health care provider suspects. °Peripheral neuropathy °Your health care provider will check areas that are affected by your nervous system (neurologic exam), such as your reflexes, how you move, and what you can feel. You may have other tests, such as: °· Blood tests. °· Removal and examination of fluid that surrounds the spinal cord (lumbar puncture). °· CT scan. °· MRI. °· A test to check the nerves that control muscles (electromyogram, EMG). °· Tests of how quickly messages pass through your nerves (nerve conduction velocity tests). °· Removal of a small piece of nerve to be examined under a microscope (biopsy). °Autonomic neuropathy °You may have tests, such as: °· Tests to measure your blood pressure and heart rate. This may include monitoring you while you are safely secured to an exam table that moves you from a lying position to an upright position (table tilt test). °· Breathing  tests to check your lungs. °· Tests to check how food moves through the digestive system (gastric emptying tests). °· Blood, sweat, or urine tests. °· Ultrasound of your bladder. °· Spinal fluid tests. °Focal neuropathy °This condition may be diagnosed with: °· A neurologic exam. °· CT scan. °· MRI. °· EMG. °· Nerve conduction velocity tests. °Proximal neuropathy °There is no test to diagnose this type of neuropathy. You may have tests to rule out other possible causes of this type of neuropathy. Tests may include: °· X-rays of your spine and lumbar region. °· Lumbar puncture. °· MRI. °How is this treated? °The   goal of treatment is to keep nerve damage from getting worse. The most important part of treatment is keeping your blood glucose level and your A1C level within your target range by following your diabetes management plan. Over time, maintaining lower blood glucose levels helps lessen symptoms. In some cases, you may need prescription pain medicine. °Follow these instructions at home: ° °Lifestyle ° °· Do not use any products that contain nicotine or tobacco, such as cigarettes and e-cigarettes. If you need help quitting, ask your health care provider. °· Be physically active every day. Include strength training and balance exercises. °· Follow a healthy meal plan. °· Work with your health care provider to manage your blood pressure. °General instructions °· Follow your diabetes management plan as directed. °? Check your blood glucose levels as directed by your health care provider. °? Keep your blood glucose in your target range as directed by your health care provider. °? Have your A1C level checked at least two times a year, or as often as told by your health care provider. °· Take over the counter and prescription medicines only as told by your health care provider. This includes insulin and diabetes medicine. °· Do not drive or use heavy machinery while taking prescription pain medicines. °· Check your  skin and feet every day for cuts, bruises, redness, blisters, or sores. °· Keep all follow up visits as told by your health care provider. This is important. °Contact a health care provider if: °· You have burning, stabbing, or aching pain in your legs or feet. °· You are unable to feel pressure or pain in your feet. °· You develop problems with digestion, such as: °? Nausea. °? Vomiting. °? Bloating. °? Constipation. °? Diarrhea. °? Abdominal pain. °· You have difficulty with urination, such as inability: °? To control when you urinate (incontinence). °? To completely empty the bladder (retention). °· You have palpitations. °· You feel dizzy, weak, or faint when you stand up. °Get help right away if: °· You cannot urinate. °· You have sudden weakness or loss of coordination. °· You have trouble speaking. °· You have pain or pressure in your chest. °· You have an irregular heart beat. °· You have sudden inability to move a part of your body. °Summary °· Diabetic neuropathy refers to nerve damage that is caused by diabetes. It can affect nerves throughout the entire body, causing numbness and pain in the arms, legs, digestive tract, heart, and other body systems. °· Keep your blood glucose level and your blood pressure in your target range, as directed by your health care provider. This can help prevent neuropathy from getting worse. °· Check your skin and feet every day for cuts, bruises, redness, blisters, or sores. °· Do not use any products that contain nicotine or tobacco, such as cigarettes and e-cigarettes. If you need help quitting, ask your health care provider. °This information is not intended to replace advice given to you by your health care provider. Make sure you discuss any questions you have with your health care provider. °Document Released: 08/26/2001 Document Revised: 07/30/2017 Document Reviewed: 07/22/2016 °Elsevier Interactive Patient Education © 2019 [employer] ° °

## 2018-06-25 NOTE — ED Triage Notes (Signed)
Pt sent from [facility] for a DVT study of the right leg.  Swelling noted to the area.  Pt states surgery scheduled for removal or toe due to osteomyelitis.

## 2018-06-25 NOTE — ED Notes (Signed)
Patient requested to leave. Informed charge Nurse. Explained to the patient that I would not advise him to leave. I explained to him that it would be in his best interest to have the scans and see a provider. The patients wife kept interrupting me telling him that they can just walk out. Cut patients band off and they left.

## 2018-06-25 NOTE — Progress Notes (Signed)
Office Visit Note   Patient: Adam Ray           Date of Birth: June 10, 1956           MRN: [id number] Visit Date: 06/25/2018              Requested by: [name], [place], [place] [address] PCP: [name], MD  No chief complaint on file.     HPI: The patient is a 62 yo gentleman who is seen for follow up of a diabetic toe ulcer of the right 3rd toe. He reports he was just seen at the foot clinic and they referred him here as they could see bone exposed and were worried he had a blood clot in the right leg. He is S/P right foot 1st and 2nd ray amputations. The patient reports worsening of swelling of the right leg over the past 3 days. Denies fever or chills.   Assessment & Plan: Visit Diagnoses:  1. Midfoot ulcer with necrosis of bone, right ([place])   2. Diabetic polyneuropathy associated with type 2 diabetes mellitus ([place])   3. Idiopathic chronic venous hypertension of both lower extremities with inflammation   4. Peripheral vascular disease ([place])     Plan: Counseled the patient and wife that the patient needs evaluation in the hospital today to work up for possible DVT and suspected osteomyelitis of the right 3rd toe. Advised patient to go to the ED to start the evaluation as soon as possible and that both of these problems could result in severe illness or even death if ignored. He agrees to go to the ED now for evaluation.  He will follow up here once discharged.   Follow-Up Instructions: No follow-ups on file.   Ortho Exam  Patient is alert, oriented, no adenopathy, well-dressed, normal affect, normal respiratory effort. The right lower leg is massively edematous with severe pitting. Tender to palpation. The right 3rd toe is edematous and there is exposed bone over the dorsal surface. There is mild erythema of the right lower leg. Pedal pulses faintly palpable with the severe edema.   Imaging: No results found. No images are  attached to the encounter.  Labs: Lab Results  Component Value Date   HGBA1C 11.1 10/21/2017   HGBA1C 9.6 07/22/2017   HGBA1C 8.3 11/19/2016   ESRSEDRATE 8 01/29/2016   ESRSEDRATE 9 04/11/2012   CRP 2.6 (H) 01/29/2016   CRP 0.5 02/28/2014   REPTSTATUS 02/03/2016 FINAL 01/29/2016   REPTSTATUS 02/03/2016 FINAL 01/29/2016   GRAMSTAIN  04/11/2012    NO WBC SEEN RARE SQUAMOUS EPITHELIAL CELLS PRESENT MODERATE GRAM POSITIVE COCCI IN PAIRS RARE GRAM NEGATIVE RODS   GRAMSTAIN  04/11/2012    NO WBC SEEN RARE SQUAMOUS EPITHELIAL CELLS PRESENT MODERATE GRAM POSITIVE COCCI IN PAIRS RARE GRAM NEGATIVE RODS   CULT NO GROWTH 5 DAYS 01/29/2016   CULT NO GROWTH 5 DAYS 01/29/2016   LABORGA NO GROWTH 2 DAYS 07/19/2014     Lab Results  Component Value Date   ALBUMIN 3.9 10/21/2017   ALBUMIN 4.0 01/29/2017   ALBUMIN 3.6 01/28/2016    There is no height or weight on file to calculate BMI.  Orders:  No orders of the defined types were placed in this encounter.  No orders of the defined types were placed in this encounter.    Procedures: No procedures performed  Clinical Data: No additional findings.  ROS:  All other systems negative, except as  noted in the HPI. Review of Systems  Objective: Vital Signs: There were no vitals taken for this visit.  Specialty Comments:  No specialty comments available.  PMFS History: Patient Active Problem List   Diagnosis Date Noted  . Limited mobility 05/12/2018  . Gingival foreign body 07/22/2017  . Anxiety state 06/02/2017  . Coated tongue 04/03/2017  . Leg swelling 01/29/2017  . Status post amputation of toe of right foot ([place]) 09/24/2016  . Idiopathic chronic venous hypertension of both lower extremities with inflammation 09/24/2016  . Onychomycosis 09/24/2016  . Midfoot skin ulcer, right, limited to breakdown of skin ([place]) 09/24/2016  . Testicular mass 04/18/2016  . Thrush 04/18/2016  . Soft tissue swelling   . Osteomyelitis  ([place]) 01/29/2016  . Diabetic polyneuropathy associated with type 2 diabetes mellitus ([place])   . Type 2 diabetes mellitus with neurologic complication, with long-term current use of insulin ([place]) 12/08/2015  . Left shoulder pain 08/10/2015  . Pulmonary nodule 02/01/2015  . Screening for colon cancer 01/09/2015  . Lumbar back pain 03/21/2014  . Screening for STD (sexually transmitted disease) 12/15/2013  . Depression 12/09/2013  . Lightheadedness 11/19/2013  . Warts, genital 08/20/2013  . 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 Right leg pain 04/27/2013  . Diabetic retinopathy ([place]) 01/28/2013  . Visit for well man health check 11/19/2010  . Sleep apnea 09/06/2010  . 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 LEG EDEMA 03/03/2009  . Essential hypertension 11/09/2008  . COPD, mild ([place]) 10/06/2006  . SICKLE-CELL TRAIT 04/26/2006  . ERECTILE DYSFUNCTION 04/26/2006  . Tobacco abuse 04/26/2006  . Peripheral vascular disease ([place]) 04/26/2006  . ALLERGIC RHINITIS 04/26/2006   Past Medical History:  Diagnosis Date  . Allergy   . Arthritis   . Bronchitis   . Cataract   . Chronic kidney disease   . Claudication [facility])    right foot ray resection  . Colon polyps    hyperplastic  . COPD (chronic obstructive pulmonary disease) ([place])   . Diabetes mellitus   . Genital warts   . Gout   . Hyperlipidemia   . Hypertension     Family History  Problem Relation Age of Onset  . Diabetes Mother   . Stroke Mother   . Heart failure Father   . Colon cancer Neg Hx   . Esophageal cancer Neg Hx   . Rectal cancer Neg Hx   . Stomach cancer Neg Hx     Past Surgical History:  Procedure Laterality Date  . AMPUTATION Right 01/31/2016   Procedure: Right 2nd Toe Amputation;  Surgeon: Newt Minion, MD;  Location: [place];  Service: Orthopedics;  Laterality: Right;  . CATARACT EXTRACTION     right eye  . COLONOSCOPY    . I&D EXTREMITY  04/11/2012   Procedure: IRRIGATION AND  DEBRIDEMENT EXTREMITY;  Surgeon: Wylene Simmer, MD;  Location: [place];  Service: Orthopedics;  Laterality: Right;  . Surgery left great toe    . Tear ducts bilateral eyes     Social History   Occupational History  . Occupation: disabled  Tobacco Use  . Smoking status: Former Smoker    Packs/day: 0.30    Years: 48.00    Pack years: 14.40    Types: Cigars    Start date: 07/02/1963  . Smokeless tobacco: Former Systems developer  . Tobacco comment: wants to use electric cigarettes.  not interested in pills, worried about chantix side effects  Substance and Sexual Activity  . Alcohol use:  Yes    Alcohol/week: 0.0 standard drinks    Comment: occassional use  . Drug use: No  . Sexual activity: Not on file

## 2018-06-25 NOTE — Progress Notes (Addendum)
Subjective:   Mr.  Adam Ray presents today for high risk foot care. He presents to clinic accompanied by his wife. His cc today is his 3rd toe right lower extremity. He states he has not been back to our clinic because he was trying to complete paperwork for the [employer]. He also states he has not been able to see Dr. Sharol Given due to not having authorization from the [place] for visits. He states he could have used his insurance, but it required a co-pay which he could not afford at the time.  His wife raises concerns regarding his compliance in regards to drinking, smoking and his insulin administration stating he continues to drink and smoke. She is also concerned about him administering his insulin directly through his clothing.  Patient denies any fever, chills, nightsweats, nausea or vomiting.   Current Outpatient Medications:  .  ACCU-CHEK FASTCLIX LANCETS MISC, , Disp: , Rfl:  .  ACCU-CHEK SOFTCLIX LANCETS lancets, Use as instructed, Disp: 100 each, Rfl: 12 .  acetaminophen (TYLENOL) 500 MG tablet, Take 2 tablets (1,000 mg total) by mouth every 8 (eight) hours as needed., Disp: 60 tablet, Rfl: 0 .  albuterol (PROVENTIL HFA;VENTOLIN HFA) 108 (90 Base) MCG/ACT inhaler, Inhale 2 puffs into the lungs every 6 (six) hours as needed for wheezing or shortness of breath., Disp: 1 Inhaler, Rfl: 0 .  aspirin EC 81 MG tablet, Take 81 mg by mouth daily., Disp: , Rfl:  .  baclofen (LIORESAL) 10 MG tablet, TAKE 1 TABLET BY MOUTH THREE TIMES A DAY AS NEEDED FOR MUSCLE SPASMS, Disp: , Rfl:  .  BD INSULIN SYRINGE ULTRAFINE 31G X 5/16" 0.3 ML MISC, , Disp: , Rfl:  .  Blood Glucose Calibration (ACCU-CHEK SMARTVIEW CONTROL) LIQD, , Disp: , Rfl:  .  Cholecalciferol (VITAMIN D) 2000 units CAPS, Take 1 capsule by mouth daily., Disp: , Rfl:  .  diphenhydrAMINE (BENADRYL) 25 MG tablet, Take by mouth., Disp: , Rfl:  .  doxycycline (VIBRAMYCIN) 100 MG capsule, TAKE 1 CAPSULE BY MOUTH TWICE A DAY, Disp: ,  Rfl:  .  furosemide (LASIX) 40 MG tablet, Take 1 tablet (40 mg total) by mouth daily., Disp: 90 tablet, Rfl: 0 .  glucose blood (ACCU-CHEK AVIVA) test strip, Use as instructed, Disp: 100 each, Rfl: 12 .  ibuprofen (ADVIL,MOTRIN) 600 MG tablet, Take by mouth., Disp: , Rfl:  .  imiquimod (ALDARA) 5 % cream, Apply topically., Disp: , Rfl:  .  insulin aspart (NOVOLOG) 100 UNIT/ML injection, Inject 25 Units into the skin 4 (four) times daily., Disp: 10 mL, Rfl:  .  lisinopril (PRINIVIL,ZESTRIL) 5 MG tablet, Take 5 mg by mouth daily., Disp: , Rfl:  .  meloxicam (MOBIC) 15 MG tablet, Take by mouth., Disp: , Rfl:  .  nitroGLYCERIN (NITRODUR - DOSED IN MG/24 HR) 0.2 mg/hr patch, Place 1 patch (0.2 mg total) onto the skin daily., Disp: 30 patch, Rfl: 2 .  rosuvastatin (CRESTOR) 20 MG tablet, TAKE 1 TABLET BY MOUTH DAILY, Disp: 90 tablet, Rfl: 1 .  sodium chloride 0.9 % infusion, Inject into the vein., Disp: , Rfl:  .  traMADol (ULTRAM) 50 MG tablet, Take by mouth., Disp: , Rfl:    No Known Allergies   Objective:   Faintly palpable pedal pulses +3 Edema RLE  Ulceration located dorsal 3rd digit right foot where he has a full thickness wound with hyperkeratotic roof, malodorous drainage and the head of the proximal phalanx is visible. Right leg  with +3 edema from pretibial distally to digits.  Xray right foot confirms clinical presentation of osteomyelitis proximal phalanx 3rd digit  Other xray findings: surgical 1st ray resection, 2nd digit amputation  Shoe Evaluation: He is wearing a black sneaker with inverted plastizote insert and top of the shoe cut out on the right foot. He is wearing  diabetic boot on the left foot.   Assessment:   1.  Osteomyelitis Right 3rd digit 2. Cellulitis/Abscess right foot 2.  NIDDM with peripheral neuropathy  Plan: 1. Patient evaluated with Dr. March Rummage on today. 2. Xrays taken right foot and discussed with patient/wife. Recommended venous doppler to rule out DVT.   Explained seriousness of  limb threatening diagnosis. They related understanding and will see Dr. Sharol Given on today. 3. Obtained authorization for [name] to see Dr.Duda on today. He and his wife related understanding.

## 2018-06-26 ENCOUNTER — Other Ambulatory Visit: Payer: Self-pay

## 2018-06-26 ENCOUNTER — Emergency Department (HOSPITAL_COMMUNITY)
Admission: EM | Admit: 2018-06-26 | Discharge: 2018-06-26 | Disposition: A | Discharge status: Home / Self Care | Payer: Medicare HMO | Attending: Emergency Medicine | Admitting: Emergency Medicine

## 2018-06-26 ENCOUNTER — Encounter (HOSPITAL_COMMUNITY): Payer: Self-pay | Admitting: Emergency Medicine

## 2018-06-26 ENCOUNTER — Emergency Department (HOSPITAL_BASED_OUTPATIENT_CLINIC_OR_DEPARTMENT_OTHER): Payer: Medicare HMO

## 2018-06-26 DIAGNOSIS — I10 Essential (primary) hypertension: Secondary | ICD-10-CM | POA: Diagnosis not present

## 2018-06-26 DIAGNOSIS — L97514 Non-pressure chronic ulcer of other part of right foot with necrosis of bone: Secondary | ICD-10-CM | POA: Insufficient documentation

## 2018-06-26 DIAGNOSIS — Z87891 Personal history of nicotine dependence: Secondary | ICD-10-CM | POA: Diagnosis not present

## 2018-06-26 DIAGNOSIS — E11621 Type 2 diabetes mellitus with foot ulcer: Secondary | ICD-10-CM | POA: Insufficient documentation

## 2018-06-26 DIAGNOSIS — Z794 Long term (current) use of insulin: Secondary | ICD-10-CM | POA: Insufficient documentation

## 2018-06-26 DIAGNOSIS — M7989 Other specified soft tissue disorders: Secondary | ICD-10-CM | POA: Diagnosis not present

## 2018-06-26 DIAGNOSIS — Z79899 Other long term (current) drug therapy: Secondary | ICD-10-CM | POA: Insufficient documentation

## 2018-06-26 DIAGNOSIS — J449 Chronic obstructive pulmonary disease, unspecified: Secondary | ICD-10-CM | POA: Diagnosis not present

## 2018-06-26 DIAGNOSIS — R2241 Localized swelling, mass and lump, right lower limb: Secondary | ICD-10-CM | POA: Diagnosis present

## 2018-06-26 DIAGNOSIS — L97509 Non-pressure chronic ulcer of other part of unspecified foot with unspecified severity: Secondary | ICD-10-CM | POA: Diagnosis not present

## 2018-06-26 NOTE — Discharge Instructions (Signed)
Please follow-up with Dr. Sharol Given as soon as possible for further evaluation and treatment of your diabetic ulcer and leg swelling and pain.  Please return to emergency department immediately if you develop any fevers, increasing pain, redness, or any other concerning symptoms.

## 2018-06-26 NOTE — ED Provider Notes (Signed)
[facility] EMERGENCY DEPARTMENT Provider Note   CSN: [id number] Arrival date & time: 06/26/18  0909     History   Chief Complaint Chief Complaint  Patient presents with  . Leg Pain    HPI Adam Ray is a 62 y.o. male with history of diabetes, hypertension, gout, COPD, previous partial rotation of the right foot who presents with right lower leg swelling for the past 3 weeks.  Patient was sent here last evening by his orthopedist, Dr. Sharol Given, to rule out DVT.  They plan to do surgery to amputated third toe toe with osteomyelitis from diabetic ulcer.  He has had worsening swelling over the past 3 days.  Patient denies any fever, chills, chest pain, shortness of breath, abdominal pain.  He has no history of blood clots.  HPI  Past Medical History:  Diagnosis Date  . Allergy   . Arthritis   . Bronchitis   . Cataract   . Chronic kidney disease   . Claudication [facility])    right foot ray resection  . Colon polyps    hyperplastic  . COPD (chronic obstructive pulmonary disease) ([place])   . Diabetes mellitus   . Genital warts   . Gout   . Hyperlipidemia   . Hypertension     Patient Active Problem List   Diagnosis Date Noted  . Limited mobility 05/12/2018  . Gingival foreign body 07/22/2017  . Anxiety state 06/02/2017  . Coated tongue 04/03/2017  . Leg swelling 01/29/2017  . Status post amputation of toe of right foot ([place]) 09/24/2016  . Idiopathic chronic venous hypertension of both lower extremities with inflammation 09/24/2016  . Onychomycosis 09/24/2016  . Midfoot skin ulcer, right, limited to breakdown of skin ([place]) 09/24/2016  . Testicular mass 04/18/2016  . Thrush 04/18/2016  . Soft tissue swelling   . Osteomyelitis ([place]) 01/29/2016  . Diabetic polyneuropathy associated with type 2 diabetes mellitus ([place])   . Type 2 diabetes mellitus with neurologic complication, with long-term current use of insulin ([place]) 12/08/2015  . Left shoulder pain  08/10/2015  . Pulmonary nodule 02/01/2015  . Screening for colon cancer 01/09/2015  . Lumbar back pain 03/21/2014  . Screening for STD (sexually transmitted disease) 12/15/2013  . Depression 12/09/2013  . Lightheadedness 11/19/2013  . Warts, genital 08/20/2013  . 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 Right leg pain 04/27/2013  . Diabetic retinopathy ([place]) 01/28/2013  . Visit for well man health check 11/19/2010  . Sleep apnea 09/06/2010  . 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 LEG EDEMA 03/03/2009  . Essential hypertension 11/09/2008  . COPD, mild ([place]) 10/06/2006  . SICKLE-CELL TRAIT 04/26/2006  . ERECTILE DYSFUNCTION 04/26/2006  . Tobacco abuse 04/26/2006  . Peripheral vascular disease ([place]) 04/26/2006  . ALLERGIC RHINITIS 04/26/2006    Past Surgical History:  Procedure Laterality Date  . AMPUTATION Right 01/31/2016   Procedure: Right 2nd Toe Amputation;  Surgeon: Newt Minion, MD;  Location: [place];  Service: Orthopedics;  Laterality: Right;  . CATARACT EXTRACTION     right eye  . COLONOSCOPY    . I&D EXTREMITY  04/11/2012   Procedure: IRRIGATION AND DEBRIDEMENT EXTREMITY;  Surgeon: Wylene Simmer, MD;  Location: [place];  Service: Orthopedics;  Laterality: Right;  . Surgery left great toe    . Tear ducts bilateral eyes          Home Medications    Prior to Admission medications   Medication Sig Start Date End Date  Taking? Authorizing Provider  ACCU-CHEK FASTCLIX LANCETS [place]  12/30/14   [provider]  ACCU-CHEK SOFTCLIX LANCETS lancets Use as instructed 11/16/15   [name], MD  acetaminophen (TYLENOL) 500 MG tablet Take 2 tablets (1,000 mg total) by mouth every 8 (eight) hours as needed. 10/02/15   [name], MD  albuterol (PROVENTIL HFA;VENTOLIN HFA) 108 (90 Base) MCG/ACT inhaler Inhale 2 puffs into the lungs every 6 (six) hours as needed for wheezing or shortness of breath. 10/20/17   [name], MD    aspirin EC 81 MG tablet Take 81 mg by mouth daily.    [provider]  baclofen (LIORESAL) 10 MG tablet TAKE 1 TABLET BY MOUTH THREE TIMES A DAY AS NEEDED FOR MUSCLE SPASMS 12/18/16   [provider]  BD INSULIN SYRINGE ULTRAFINE 31G X 5/16" 0.3 ML MISC  12/30/14   [provider]  Blood Glucose Calibration (ACCU-CHEK SMARTVIEW CONTROL) LIQD  12/30/14   [provider]  Cholecalciferol (VITAMIN D) 2000 units CAPS Take 1 capsule by mouth daily.    [provider]  diphenhydrAMINE (BENADRYL) 25 MG tablet Take by mouth.    [provider]  doxycycline (VIBRAMYCIN) 100 MG capsule TAKE 1 CAPSULE BY MOUTH TWICE A DAY 11/11/16   [provider]  furosemide (LASIX) 40 MG tablet Take 1 tablet (40 mg total) by mouth daily. 10/16/17   [name], MD  glucose blood (ACCU-CHEK AVIVA) test strip Use as instructed 11/16/15   [name], MD  ibuprofen (ADVIL,MOTRIN) 600 MG tablet Take by mouth. 06/13/16   [provider]  imiquimod (ALDARA) 5 % cream Apply topically. 05/22/16   [provider]  insulin aspart (NOVOLOG) 100 UNIT/ML injection Inject 25 Units into the skin 4 (four) times daily. 06/13/16   [name], MD  lisinopril (PRINIVIL,ZESTRIL) 5 MG tablet Take 5 mg by mouth daily.    [provider]  meloxicam (MOBIC) 15 MG tablet Take by mouth. 12/18/16   [provider]  nitroGLYCERIN (NITRODUR - DOSED IN MG/24 HR) 0.2 mg/hr patch Place 1 patch (0.2 mg total) onto the skin daily. 12/18/16   Newt Minion, MD  rosuvastatin (CRESTOR) 20 MG tablet TAKE 1 TABLET BY MOUTH DAILY 07/24/16   [name], MD  sodium chloride 0.9 % infusion Inject into the vein. 07/10/16   [provider]  traMADol (ULTRAM) 50 MG tablet Take by mouth.    [provider]  levofloxacin (LEVAQUIN) 750 MG tablet Take 1 tablet (750 mg total) by mouth daily. For 14 days 07/07/12 07/24/12  Verdie Drown,  Samuel Germany, MD    Family History Family History  Problem Relation Age of Onset  . Diabetes Mother   . Stroke Mother   . Heart failure Father   . Colon cancer Neg Hx   . Esophageal cancer Neg Hx   . Rectal cancer Neg Hx   . Stomach cancer Neg Hx     Social History Social History   Tobacco Use  . Smoking status: Former Smoker    Packs/day: 0.30    Years: 48.00    Pack years: 14.40    Types: Cigars    Start date: 07/02/1963  . Smokeless tobacco: Former Systems developer  . Tobacco comment: wants to use electric cigarettes.  not interested in pills, worried about chantix side effects  Substance Use Topics  . Alcohol use: Yes    Alcohol/week: 0.0 standard drinks  Comment: occassional use  . Drug use: No     Allergies   Patient has no known allergies.   Review of Systems Review of Systems  Constitutional: Negative for chills and fever.  HENT: Negative for facial swelling and sore throat.   Respiratory: Negative for shortness of breath.   Cardiovascular: Positive for leg swelling. Negative for chest pain.  Gastrointestinal: Negative for abdominal pain, nausea and vomiting.  Genitourinary: Negative for dysuria.  Musculoskeletal: Positive for arthralgias. Negative for back pain.  Skin: Negative for rash and wound.  Neurological: Negative for headaches.  Psychiatric/Behavioral: The patient is not nervous/anxious.      Physical Exam Updated Vital Signs BP 123/72 (BP Location: Right Arm)   Pulse 88   Temp 98.8 F (37.1 C) (Oral)   Ht 5\' 10"  (1.778 m)   Wt 77.1 kg   SpO2 99%   BMI 24.39 kg/m   Physical Exam Vitals signs and nursing note reviewed.  Constitutional:      General: He is not in acute distress.    Appearance: He is well-developed. He is not diaphoretic.  HENT:     Head: Normocephalic and atraumatic.     Mouth/Throat:     Pharynx: No oropharyngeal exudate.  Eyes:     General: No scleral icterus.       Right eye: No discharge.        Left eye: No discharge.       Conjunctiva/sclera: Conjunctivae normal.     Pupils: Pupils are equal, round, and reactive to light.  Neck:     Musculoskeletal: Normal range of motion and neck supple.     Thyroid: No thyromegaly.  Cardiovascular:     Rate and Rhythm: Normal rate and regular rhythm.     Heart sounds: Normal heart sounds. No murmur. No friction rub. No gallop.   Pulmonary:     Effort: Pulmonary effort is normal. No respiratory distress.     Breath sounds: Normal breath sounds. No stridor. No wheezing or rales.  Abdominal:     General: Bowel sounds are normal. There is no distension.     Palpations: Abdomen is soft.     Tenderness: There is no abdominal tenderness. There is no guarding or rebound.  Musculoskeletal:        General: Swelling present.     Right lower leg: Edema (3-4+, no pain) present.     Comments: Diabetic ulcer to the third foot, first and second rays are amputated.  Tenderness to the ulcer and to midfoot along the ray, no tenderness elsewhere.  There is significant pitting edema to the right extremity without tenderness  Lymphadenopathy:     Cervical: No cervical adenopathy.  Skin:    General: Skin is warm and dry.     Coloration: Skin is not pale.     Findings: No rash.  Neurological:     Mental Status: He is alert.     Coordination: Coordination normal.          ED Treatments / Results  Labs (all labs ordered are listed, but only abnormal results are displayed) Labs Reviewed - No data to display  EKG None  Radiology Vas [place] Lower Extremity Venous (dvt) (only Mc & Wl 7a-7p)  Result Date: 06/26/2018  Lower Venous Study Indications: Swelling.  Limitations: Poor ultrasound/tissue interface. Performing Technologist: Oliver Hum RVT  Examination Guidelines: A complete evaluation includes B-mode imaging, spectral Doppler, color Doppler, and power Doppler as needed of all accessible portions of each  vessel. Bilateral testing is considered an integral part of a  complete examination. Limited examinations for reoccurring indications may be performed as noted.  Right Venous Findings: +---------+---------------+---------+-----------+----------+-------+          CompressibilityPhasicitySpontaneityPropertiesSummary +---------+---------------+---------+-----------+----------+-------+ CFV      Full           Yes      Yes                          +---------+---------------+---------+-----------+----------+-------+ SFJ      Full                                                 +---------+---------------+---------+-----------+----------+-------+ FV Prox  Full                                                 +---------+---------------+---------+-----------+----------+-------+ FV Mid   Full                                                 +---------+---------------+---------+-----------+----------+-------+ FV DistalFull                                                 +---------+---------------+---------+-----------+----------+-------+ PFV      Full                                                 +---------+---------------+---------+-----------+----------+-------+ POP      Full           Yes      Yes                          +---------+---------------+---------+-----------+----------+-------+ PTV      Full                                                 +---------+---------------+---------+-----------+----------+-------+ PERO     Full                                                 +---------+---------------+---------+-----------+----------+-------+  Left Venous Findings: +---+---------------+---------+-----------+----------+-------+    CompressibilityPhasicitySpontaneityPropertiesSummary +---+---------------+---------+-----------+----------+-------+ CFVFull           Yes      Yes                          +---+---------------+---------+-----------+----------+-------+    Summary: Right: There is no evidence  of deep vein thrombosis in the lower extremity. However, portions of this examination were limited- see  technologist comments above. No cystic structure found in the popliteal fossa. Left: No evidence of common femoral vein obstruction.  *See table(s) above for measurements and observations.    Preliminary     Procedures Procedures (including critical care time)  Medications Ordered in ED Medications - No data to display   Initial Impression / Assessment and Plan / ED Course  I have reviewed the triage vital signs and the nursing notes.  Pertinent labs & imaging results that were available during my care of the patient were reviewed by me and considered in my medical decision making (see chart for details).     Patient presenting for rule out of DVT for right lower extremity swelling in the setting of diabetic ulcer.  Patient reports he was sent here only to rule out DVT and Dr. Sharol Given plans to operate on his foot next week.  Patient saw a PA at Dr. Jess Barters office yesterday whose note was not as clear and stated that patient was sent for work-up of osteo-as well.  Labs at 8 PM last night were unremarkable except for mild elevation in creatinine.  Patient states he has not been drinking very much water this week.  Advised to increase fluid intake.  DVT ultrasound is negative in the right lower extremity.  I discussed patient case with Hilbert Odor, PA-C, on-call for orthopedics, who advised that patient will follow-up outpatient to schedule surgery.  There are no signs of sepsis.  Patient is afebrile and well-appearing.  Return precautions discussed.  Patient or stands and agrees with plan.  Patient vitals stable throughout ED course and discharged in satisfactory condition. I discussed patient case with Dr. Dayna Barker who guided the patient's management and agrees with plan.   Final Clinical Impressions(s) / ED Diagnoses   Final diagnoses:  Right leg swelling  Diabetic ulcer of toe of right foot  associated with type 2 diabetes mellitus, with necrosis of bone [facility])    ED Discharge Orders    None       Frederica Kuster, PA-C 06/26/18 1154    Mesner, Corene Cornea, MD 06/26/18 1524

## 2018-06-26 NOTE — Progress Notes (Signed)
Right lower extremity venous duplex has been completed. Negative for DVT. Results were given to Eliezer Mccoy PA.  06/26/18 11:19 AM Carlos Levering RVT

## 2018-06-26 NOTE — ED Triage Notes (Signed)
Pt. Stated, I was here yesterday and didn't wait. Ive had leg pain for 3 weeks. No injury. I have blood clots.Im suppose to have a [place] on my leg.  Right leg is swollen.

## 2018-06-29 ENCOUNTER — Encounter (INDEPENDENT_AMBULATORY_CARE_PROVIDER_SITE_OTHER): Payer: Self-pay | Admitting: Orthopedic Surgery

## 2018-06-29 ENCOUNTER — Ambulatory Visit (INDEPENDENT_AMBULATORY_CARE_PROVIDER_SITE_OTHER): Payer: Medicare HMO | Admitting: Orthopedic Surgery

## 2018-06-29 VITALS — Ht 70.0 in | Wt 170.0 lb

## 2018-06-29 DIAGNOSIS — L97414 Non-pressure chronic ulcer of right heel and midfoot with necrosis of bone: Secondary | ICD-10-CM

## 2018-07-01 ENCOUNTER — Encounter (INDEPENDENT_AMBULATORY_CARE_PROVIDER_SITE_OTHER): Payer: Self-pay | Admitting: Orthopedic Surgery

## 2018-07-01 NOTE — Progress Notes (Signed)
Office Visit Note   Patient: Adam Ray           Date of Birth: 1956-04-22           MRN: [id number] Visit Date: 06/29/2018              Requested by: [name], [address] [place], [place] [address] PCP: [name], MD  Chief Complaint  Patient presents with  . Right Foot - Follow-up      HPI: Patient is seen in follow-up status post great toe and second toe amputation the right foot with osteomyelitis of the third toe at this time.  Patient had significant increased swelling the right lower extremity he did undergo an ultrasound which was negative for DVT.  Patient states he is currently disabled.  Assessment & Plan: Visit Diagnoses:  1. Midfoot ulcer with necrosis of bone, right ([place])     Plan: We will plan for transmetatarsal amputation on the right.  Risks and benefits of surgery were discussed including risk of the wound not healing need for higher level amputation.  Patient states he understands wishes to proceed at this time.  Will place a Dynaflex wrap at this time to decrease the swelling until surgery.  Follow-Up Instructions: Return in about 1 week (around 07/06/2018).   Ortho Exam  Patient is alert, oriented, no adenopathy, well-dressed, normal affect, normal respiratory effort. Examination patient has good capillary refill to right lower extremity a pulses not palpable due to the venous stasis swelling with pitting edema.  There is ulceration of the third toe with exposed bone with osteomyelitis.  Status post great toe and second toe amputation.  Patient is currently full weightbearing.  Discussed the need to be nonweightbearing postoperatively.  Imaging: No results found. No images are attached to the encounter.  Labs: Lab Results  Component Value Date   HGBA1C 11.1 10/21/2017   HGBA1C 9.6 07/22/2017   HGBA1C 8.3 11/19/2016   ESRSEDRATE 8 01/29/2016   ESRSEDRATE 9 04/11/2012   CRP 2.6 (H) 01/29/2016   CRP 0.5  02/28/2014   REPTSTATUS 02/03/2016 FINAL 01/29/2016   REPTSTATUS 02/03/2016 FINAL 01/29/2016   GRAMSTAIN  04/11/2012    NO WBC SEEN RARE SQUAMOUS EPITHELIAL CELLS PRESENT MODERATE GRAM POSITIVE COCCI IN PAIRS RARE GRAM NEGATIVE RODS   GRAMSTAIN  04/11/2012    NO WBC SEEN RARE SQUAMOUS EPITHELIAL CELLS PRESENT MODERATE GRAM POSITIVE COCCI IN PAIRS RARE GRAM NEGATIVE RODS   CULT NO GROWTH 5 DAYS 01/29/2016   CULT NO GROWTH 5 DAYS 01/29/2016   LABORGA NO GROWTH 2 DAYS 07/19/2014     Lab Results  Component Value Date   ALBUMIN 3.9 10/21/2017   ALBUMIN 4.0 01/29/2017   ALBUMIN 3.6 01/28/2016    Body mass index is 24.39 kg/m.  Orders:  No orders of the defined types were placed in this encounter.  No orders of the defined types were placed in this encounter.    Procedures: No procedures performed  Clinical Data: No additional findings.  ROS:  All other systems negative, except as noted in the HPI. Review of Systems  Objective: Vital Signs: Ht 5\' 10"  (1.778 m)   Wt 170 lb (77.1 kg)   BMI 24.39 kg/m   Specialty Comments:  No specialty comments available.  PMFS History: Patient Active Problem List   Diagnosis Date Noted  . Limited mobility 05/12/2018  . Gingival foreign body 07/22/2017  . Anxiety state 06/02/2017  . Coated tongue 04/03/2017  .  Leg swelling 01/29/2017  . Status post amputation of toe of right foot ([place]) 09/24/2016  . Idiopathic chronic venous hypertension of both lower extremities with inflammation 09/24/2016  . Onychomycosis 09/24/2016  . Midfoot skin ulcer, right, limited to breakdown of skin ([place]) 09/24/2016  . Testicular mass 04/18/2016  . Thrush 04/18/2016  . Soft tissue swelling   . Osteomyelitis ([place]) 01/29/2016  . Diabetic polyneuropathy associated with type 2 diabetes mellitus ([place])   . Type 2 diabetes mellitus with neurologic complication, with long-term current use of insulin ([place]) 12/08/2015  . Left shoulder pain 08/10/2015    . Pulmonary nodule 02/01/2015  . Screening for colon cancer 01/09/2015  . Lumbar back pain 03/21/2014  . Screening for STD (sexually transmitted disease) 12/15/2013  . Depression 12/09/2013  . Lightheadedness 11/19/2013  . Warts, genital 08/20/2013  . 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 Right leg pain 04/27/2013  . Diabetic retinopathy ([place]) 01/28/2013  . Visit for well man health check 11/19/2010  . Sleep apnea 09/06/2010  . 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 LEG EDEMA 03/03/2009  . Essential hypertension 11/09/2008  . COPD, mild ([place]) 10/06/2006  . SICKLE-CELL TRAIT 04/26/2006  . ERECTILE DYSFUNCTION 04/26/2006  . Tobacco abuse 04/26/2006  . Peripheral vascular disease ([place]) 04/26/2006  . ALLERGIC RHINITIS 04/26/2006   Past Medical History:  Diagnosis Date  . Allergy   . Arthritis   . Bronchitis   . Cataract   . Chronic kidney disease   . Claudication [facility])    right foot ray resection  . Colon polyps    hyperplastic  . COPD (chronic obstructive pulmonary disease) ([place])   . Diabetes mellitus   . Genital warts   . Gout   . Hyperlipidemia   . Hypertension     Family History  Problem Relation Age of Onset  . Diabetes Mother   . Stroke Mother   . Heart failure Father   . Colon cancer Neg Hx   . Esophageal cancer Neg Hx   . Rectal cancer Neg Hx   . Stomach cancer Neg Hx     Past Surgical History:  Procedure Laterality Date  . AMPUTATION Right 01/31/2016   Procedure: Right 2nd Toe Amputation;  Surgeon: Newt Minion, MD;  Location: [place];  Service: Orthopedics;  Laterality: Right;  . CATARACT EXTRACTION     right eye  . COLONOSCOPY    . I&D EXTREMITY  04/11/2012   Procedure: IRRIGATION AND DEBRIDEMENT EXTREMITY;  Surgeon: Wylene Simmer, MD;  Location: [place];  Service: Orthopedics;  Laterality: Right;  . Surgery left great toe    . Tear ducts bilateral eyes     Social History   Occupational History  . Occupation: disabled   Tobacco Use  . Smoking status: Former Smoker    Packs/day: 0.30    Years: 48.00    Pack years: 14.40    Types: Cigars    Start date: 07/02/1963  . Smokeless tobacco: Former Systems developer  . Tobacco comment: wants to use electric cigarettes.  not interested in pills, worried about chantix side effects  Substance and Sexual Activity  . Alcohol use: Yes    Alcohol/week: 0.0 standard drinks    Comment: occassional use  . Drug use: No  . Sexual activity: Not on file

## 2018-07-02 ENCOUNTER — Telehealth: Payer: Self-pay | Admitting: Podiatry

## 2018-07-02 NOTE — Telephone Encounter (Signed)
Please call patient, has to speak with Dr. Elisha Ponder about something personal

## 2018-07-03 ENCOUNTER — Ambulatory Visit (INDEPENDENT_AMBULATORY_CARE_PROVIDER_SITE_OTHER): Payer: Self-pay | Admitting: Physician Assistant

## 2018-07-06 ENCOUNTER — Encounter (HOSPITAL_COMMUNITY): Payer: Self-pay | Admitting: *Deleted

## 2018-07-06 ENCOUNTER — Other Ambulatory Visit: Payer: Self-pay

## 2018-07-06 NOTE — Progress Notes (Signed)
Spoke with pt for pre-op call. Pt denies cardiac history and HTN. Pt is a type 2 Diabetic. Last A1C was 11.1 on 10/21/17. He states the [place] adjusted his meds in April and he has not had another A1C done since. He states his fasting blood sugar can be below 70 up to 250. States he's not had any recent high ones. Pt instructed to take 1/2 of his regular dose of Lantus Insulin Tuesday evening and Wednesday AM (after checking blood sugar that morning). He will take 20 units. Pt instructed not to take his Novalog insulin Wednesday AM. Instructed pt to check his blood sugar when he gets up Wednesday AM and every 2 hours until he leaves for the hospital. If blood sugar is 70 or below, treat with 1/2 cup of clear juice (apple or cranberry) and recheck blood sugar 15 minutes after drinking juice. If blood sugar continues to be 70 or below, call the Short Stay department and ask to speak to a nurse. Pt voiced understanding.

## 2018-07-08 ENCOUNTER — Encounter (HOSPITAL_COMMUNITY)
Admission: RE | Disposition: A | Discharge status: Home / Self Care | Payer: Self-pay | Source: Home / Self Care | Attending: Orthopedic Surgery

## 2018-07-08 ENCOUNTER — Encounter (HOSPITAL_COMMUNITY): Payer: Self-pay

## 2018-07-08 ENCOUNTER — Ambulatory Visit (HOSPITAL_COMMUNITY)
Admission: RE | Admit: 2018-07-08 | Discharge: 2018-07-09 | Disposition: A | Discharge status: Home / Self Care | Payer: No Typology Code available for payment source | Attending: Orthopedic Surgery | Admitting: Orthopedic Surgery

## 2018-07-08 ENCOUNTER — Ambulatory Visit (HOSPITAL_COMMUNITY): Payer: No Typology Code available for payment source | Admitting: Anesthesiology

## 2018-07-08 ENCOUNTER — Other Ambulatory Visit: Payer: Self-pay

## 2018-07-08 DIAGNOSIS — M869 Osteomyelitis, unspecified: Secondary | ICD-10-CM | POA: Diagnosis present

## 2018-07-08 DIAGNOSIS — M1990 Unspecified osteoarthritis, unspecified site: Secondary | ICD-10-CM | POA: Insufficient documentation

## 2018-07-08 DIAGNOSIS — L97414 Non-pressure chronic ulcer of right heel and midfoot with necrosis of bone: Secondary | ICD-10-CM | POA: Diagnosis not present

## 2018-07-08 DIAGNOSIS — M86171 Other acute osteomyelitis, right ankle and foot: Secondary | ICD-10-CM | POA: Diagnosis not present

## 2018-07-08 DIAGNOSIS — E1122 Type 2 diabetes mellitus with diabetic chronic kidney disease: Secondary | ICD-10-CM | POA: Insufficient documentation

## 2018-07-08 DIAGNOSIS — F17210 Nicotine dependence, cigarettes, uncomplicated: Secondary | ICD-10-CM | POA: Diagnosis not present

## 2018-07-08 DIAGNOSIS — N189 Chronic kidney disease, unspecified: Secondary | ICD-10-CM | POA: Insufficient documentation

## 2018-07-08 DIAGNOSIS — I129 Hypertensive chronic kidney disease with stage 1 through stage 4 chronic kidney disease, or unspecified chronic kidney disease: Secondary | ICD-10-CM | POA: Insufficient documentation

## 2018-07-08 DIAGNOSIS — Z89431 Acquired absence of right foot: Secondary | ICD-10-CM

## 2018-07-08 DIAGNOSIS — E11621 Type 2 diabetes mellitus with foot ulcer: Secondary | ICD-10-CM | POA: Diagnosis not present

## 2018-07-08 DIAGNOSIS — E1169 Type 2 diabetes mellitus with other specified complication: Secondary | ICD-10-CM | POA: Insufficient documentation

## 2018-07-08 DIAGNOSIS — Z794 Long term (current) use of insulin: Secondary | ICD-10-CM | POA: Insufficient documentation

## 2018-07-08 DIAGNOSIS — J449 Chronic obstructive pulmonary disease, unspecified: Secondary | ICD-10-CM | POA: Diagnosis not present

## 2018-07-08 HISTORY — DX: Pneumonia, unspecified organism: J18.9

## 2018-07-08 HISTORY — DX: Acquired absence of right foot: Z89.431

## 2018-07-08 HISTORY — PX: AMPUTATION: SHX166

## 2018-07-08 HISTORY — PX: TRANSMETATARSAL AMPUTATION: SHX6197

## 2018-07-08 LAB — GLUCOSE, CAPILLARY
GLUCOSE-CAPILLARY: 204 mg/dL — AB (ref 70–99)
Glucose-Capillary: 136 mg/dL — ABNORMAL HIGH (ref 70–99)
Glucose-Capillary: 216 mg/dL — ABNORMAL HIGH (ref 70–99)
Glucose-Capillary: 297 mg/dL — ABNORMAL HIGH (ref 70–99)

## 2018-07-08 SURGERY — AMPUTATION, FOOT, RAY
Anesthesia: General | Laterality: Right

## 2018-07-08 MED ORDER — FENTANYL CITRATE (PF) 100 MCG/2ML IJ SOLN
50.0000 ug | Freq: Once | INTRAMUSCULAR | Status: AC
  Administered 2018-07-08: 50 ug via INTRAVENOUS

## 2018-07-08 MED ORDER — INSULIN ASPART 100 UNIT/ML ~~LOC~~ SOLN
4.0000 [IU] | Freq: Three times a day (TID) | SUBCUTANEOUS | Status: DC
  Administered 2018-07-08: 4 [IU] via SUBCUTANEOUS

## 2018-07-08 MED ORDER — OXYCODONE HCL 5 MG PO TABS: 5.0000 mg | ORAL_TABLET | ORAL | Status: DC | PRN

## 2018-07-08 MED ORDER — PHENYLEPHRINE HCL 10 MG/ML IJ SOLN: INTRAMUSCULAR | Status: DC | PRN

## 2018-07-08 MED ORDER — ONDANSETRON HCL 4 MG/2ML IJ SOLN
INTRAMUSCULAR | Status: DC | PRN
  Administered 2018-07-08: 4 mg via INTRAVENOUS

## 2018-07-08 MED ORDER — ONDANSETRON HCL 4 MG PO TABS: 4.0000 mg | ORAL_TABLET | Freq: Four times a day (QID) | ORAL | Status: DC | PRN

## 2018-07-08 MED ORDER — CHLORHEXIDINE GLUCONATE 4 % EX LIQD: 60.0000 mL | Freq: Once | CUTANEOUS | Status: DC

## 2018-07-08 MED ORDER — METHOCARBAMOL 500 MG PO TABS: 500.0000 mg | ORAL_TABLET | Freq: Four times a day (QID) | ORAL | Status: DC | PRN

## 2018-07-08 MED ORDER — MIDAZOLAM HCL 2 MG/2ML IJ SOLN
INTRAMUSCULAR | Status: AC
  Administered 2018-07-08: 1 mg via INTRAVENOUS
  Filled 2018-07-08: qty 2

## 2018-07-08 MED ORDER — METFORMIN HCL 500 MG PO TABS
1000.0000 mg | ORAL_TABLET | Freq: Two times a day (BID) | ORAL | Status: DC
  Administered 2018-07-08: 1000 mg via ORAL
  Filled 2018-07-08: qty 2

## 2018-07-08 MED ORDER — EPHEDRINE SULFATE 50 MG/ML IJ SOLN
INTRAMUSCULAR | Status: DC | PRN
  Administered 2018-07-08: 10 mg via INTRAVENOUS

## 2018-07-08 MED ORDER — PROPOFOL 10 MG/ML IV BOLUS
INTRAVENOUS | Status: DC | PRN
  Administered 2018-07-08: 200 mg via INTRAVENOUS

## 2018-07-08 MED ORDER — ACETAMINOPHEN 325 MG PO TABS: 325.0000 mg | ORAL_TABLET | Freq: Four times a day (QID) | ORAL | Status: DC | PRN

## 2018-07-08 MED ORDER — FENTANYL CITRATE (PF) 250 MCG/5ML IJ SOLN
INTRAMUSCULAR | Status: AC
  Filled 2018-07-08: qty 5

## 2018-07-08 MED ORDER — CEFAZOLIN SODIUM-DEXTROSE 1-4 GM/50ML-% IV SOLN
1.0000 g | Freq: Four times a day (QID) | INTRAVENOUS | Status: AC
  Administered 2018-07-08 – 2018-07-09 (×3): 1 g via INTRAVENOUS
  Filled 2018-07-08 (×3): qty 50

## 2018-07-08 MED ORDER — METOCLOPRAMIDE HCL 5 MG/ML IJ SOLN: 5.0000 mg | Freq: Three times a day (TID) | INTRAMUSCULAR | Status: DC | PRN

## 2018-07-08 MED ORDER — PROPOFOL 10 MG/ML IV BOLUS
INTRAVENOUS | Status: AC
  Filled 2018-07-08: qty 20

## 2018-07-08 MED ORDER — EPHEDRINE 5 MG/ML INJ
INTRAVENOUS | Status: AC
  Filled 2018-07-08: qty 10

## 2018-07-08 MED ORDER — INSULIN GLARGINE 100 UNIT/ML ~~LOC~~ SOLN
40.0000 [IU] | Freq: Two times a day (BID) | SUBCUTANEOUS | Status: DC
  Administered 2018-07-08: 40 [IU] via SUBCUTANEOUS
  Filled 2018-07-08 (×2): qty 0.4

## 2018-07-08 MED ORDER — SODIUM CHLORIDE 0.9 % IV SOLN: INTRAVENOUS | Status: DC

## 2018-07-08 MED ORDER — LIDOCAINE HCL (CARDIAC) PF 100 MG/5ML IV SOSY
PREFILLED_SYRINGE | INTRAVENOUS | Status: DC | PRN
  Administered 2018-07-08: 60 mg via INTRATRACHEAL

## 2018-07-08 MED ORDER — MIDAZOLAM HCL 2 MG/2ML IJ SOLN
INTRAMUSCULAR | Status: DC | PRN
  Administered 2018-07-08 (×2): 1 mg via INTRAVENOUS

## 2018-07-08 MED ORDER — ROPIVACAINE HCL 5 MG/ML IJ SOLN
INTRAMUSCULAR | Status: DC | PRN
  Administered 2018-07-08: 30 mL via PERINEURAL

## 2018-07-08 MED ORDER — ASPIRIN EC 81 MG PO TBEC
81.0000 mg | DELAYED_RELEASE_TABLET | Freq: Every day | ORAL | Status: DC
  Administered 2018-07-08: 81 mg via ORAL
  Filled 2018-07-08: qty 1

## 2018-07-08 MED ORDER — INSULIN ASPART 100 UNIT/ML ~~LOC~~ SOLN
0.0000 [IU] | Freq: Three times a day (TID) | SUBCUTANEOUS | Status: DC
  Administered 2018-07-08: 5 [IU] via SUBCUTANEOUS

## 2018-07-08 MED ORDER — HYDROMORPHONE HCL 1 MG/ML IJ SOLN: 0.5000 mg | INTRAMUSCULAR | Status: DC | PRN

## 2018-07-08 MED ORDER — FENTANYL CITRATE (PF) 100 MCG/2ML IJ SOLN: 25.0000 ug | INTRAMUSCULAR | Status: DC | PRN

## 2018-07-08 MED ORDER — ONDANSETRON HCL 4 MG/2ML IJ SOLN
INTRAMUSCULAR | Status: AC
  Filled 2018-07-08: qty 2

## 2018-07-08 MED ORDER — FENTANYL CITRATE (PF) 100 MCG/2ML IJ SOLN
INTRAMUSCULAR | Status: AC
  Administered 2018-07-08: 50 ug via INTRAVENOUS
  Filled 2018-07-08: qty 2

## 2018-07-08 MED ORDER — CEFAZOLIN SODIUM-DEXTROSE 2-4 GM/100ML-% IV SOLN
2.0000 g | INTRAVENOUS | Status: AC
  Administered 2018-07-08: 2 g via INTRAVENOUS

## 2018-07-08 MED ORDER — METHOCARBAMOL 1000 MG/10ML IJ SOLN
500.0000 mg | Freq: Four times a day (QID) | INTRAVENOUS | Status: DC | PRN
  Filled 2018-07-08: qty 5

## 2018-07-08 MED ORDER — OXYCODONE HCL 5 MG PO TABS: 10.0000 mg | ORAL_TABLET | ORAL | Status: DC | PRN

## 2018-07-08 MED ORDER — MAGNESIUM CITRATE PO SOLN: 1.0000 | Freq: Once | ORAL | Status: DC | PRN

## 2018-07-08 MED ORDER — ONDANSETRON HCL 4 MG/2ML IJ SOLN: 4.0000 mg | Freq: Four times a day (QID) | INTRAMUSCULAR | Status: DC | PRN

## 2018-07-08 MED ORDER — MIDAZOLAM HCL 2 MG/2ML IJ SOLN
INTRAMUSCULAR | Status: AC
  Filled 2018-07-08: qty 2

## 2018-07-08 MED ORDER — OXYCODONE HCL 5 MG PO TABS: 5.0000 mg | ORAL_TABLET | Freq: Once | ORAL | Status: DC | PRN

## 2018-07-08 MED ORDER — ONDANSETRON HCL 4 MG/2ML IJ SOLN: 4.0000 mg | Freq: Once | INTRAMUSCULAR | Status: DC | PRN

## 2018-07-08 MED ORDER — LIDOCAINE 2% (20 MG/ML) 5 ML SYRINGE
INTRAMUSCULAR | Status: AC
  Filled 2018-07-08: qty 5

## 2018-07-08 MED ORDER — LACTATED RINGERS IV SOLN
INTRAVENOUS | Status: DC
  Administered 2018-07-08: 09:00:00 via INTRAVENOUS

## 2018-07-08 MED ORDER — METOCLOPRAMIDE HCL 5 MG PO TABS: 5.0000 mg | ORAL_TABLET | Freq: Three times a day (TID) | ORAL | Status: DC | PRN

## 2018-07-08 MED ORDER — OXYCODONE HCL 5 MG/5ML PO SOLN: 5.0000 mg | Freq: Once | ORAL | Status: DC | PRN

## 2018-07-08 MED ORDER — GLYCOPYRROLATE 0.2 MG/ML IJ SOLN
INTRAMUSCULAR | Status: DC | PRN
  Administered 2018-07-08: 0.2 mg via INTRAVENOUS

## 2018-07-08 MED ORDER — BISACODYL 10 MG RE SUPP: 10.0000 mg | Freq: Every day | RECTAL | Status: DC | PRN

## 2018-07-08 MED ORDER — POLYETHYLENE GLYCOL 3350 17 G PO PACK: 17.0000 g | PACK | Freq: Every day | ORAL | Status: DC | PRN

## 2018-07-08 MED ORDER — MIDAZOLAM HCL 2 MG/2ML IJ SOLN
1.0000 mg | Freq: Once | INTRAMUSCULAR | Status: AC
  Administered 2018-07-08: 1 mg via INTRAVENOUS

## 2018-07-08 MED ORDER — DOCUSATE SODIUM 100 MG PO CAPS
100.0000 mg | ORAL_CAPSULE | Freq: Two times a day (BID) | ORAL | Status: DC
  Administered 2018-07-08: 100 mg via ORAL
  Filled 2018-07-08: qty 1

## 2018-07-08 SURGICAL SUPPLY — 30 items
BLADE SAW SGTL MED 73X18.5 STR (BLADE) IMPLANT
BLADE SURG 21 STRL SS (BLADE) ×2 IMPLANT
BNDG COHESIVE 4X5 TAN STRL (GAUZE/BANDAGES/DRESSINGS) ×2 IMPLANT
BNDG GAUZE ELAST 4 BULKY (GAUZE/BANDAGES/DRESSINGS) ×2 IMPLANT
CANISTER WOUND CARE 500ML ATS (WOUND CARE) ×1 IMPLANT
COVER SURGICAL LIGHT HANDLE (MISCELLANEOUS) ×4 IMPLANT
COVER WAND RF STERILE (DRAPES) ×2 IMPLANT
DRAPE U-SHAPE 47X51 STRL (DRAPES) ×4 IMPLANT
DRSG ADAPTIC 3X8 NADH LF (GAUZE/BANDAGES/DRESSINGS) ×2 IMPLANT
DRSG PAD ABDOMINAL 8X10 ST (GAUZE/BANDAGES/DRESSINGS) ×4 IMPLANT
DURAPREP 26ML APPLICATOR (WOUND CARE) ×2 IMPLANT
ELECT REM PT RETURN 9FT ADLT (ELECTROSURGICAL) ×2
ELECTRODE REM PT RTRN 9FT ADLT (ELECTROSURGICAL) ×1 IMPLANT
GAUZE SPONGE 4X4 12PLY STRL (GAUZE/BANDAGES/DRESSINGS) ×2 IMPLANT
GLOVE BIOGEL PI IND STRL 9 (GLOVE) ×1 IMPLANT
GLOVE BIOGEL PI INDICATOR 9 (GLOVE) ×1
GLOVE SURG ORTHO 9.0 STRL STRW (GLOVE) ×2 IMPLANT
GOWN STRL REUS W/ TWL XL LVL3 (GOWN DISPOSABLE) ×2 IMPLANT
GOWN STRL REUS W/TWL XL LVL3 (GOWN DISPOSABLE) ×4
KIT BASIN OR (CUSTOM PROCEDURE TRAY) ×2 IMPLANT
KIT PREVENA INCISION MGT 13 (CANNISTER) ×1 IMPLANT
KIT TURNOVER KIT B (KITS) ×2 IMPLANT
NS IRRIG 1000ML POUR BTL (IV SOLUTION) ×2 IMPLANT
PACK ORTHO EXTREMITY (CUSTOM PROCEDURE TRAY) ×2 IMPLANT
PAD ARMBOARD 7.5X6 YLW CONV (MISCELLANEOUS) ×4 IMPLANT
STOCKINETTE IMPERVIOUS LG (DRAPES) IMPLANT
SUT ETHILON 2 0 PSLX (SUTURE) ×2 IMPLANT
TOWEL OR 17X26 10 PK STRL BLUE (TOWEL DISPOSABLE) ×2 IMPLANT
TUBE CONNECTING 12X1/4 (SUCTIONS) ×2 IMPLANT
YANKAUER SUCT BULB TIP NO VENT (SUCTIONS) ×2 IMPLANT

## 2018-07-08 NOTE — Anesthesia Procedure Notes (Signed)
Anesthesia Regional Block: Popliteal block   Pre-Anesthetic Checklist: ,, timeout performed, Correct Patient, Correct Site, Correct Laterality, Correct Procedure, Correct Position, site marked, Risks and benefits discussed,  Surgical consent,  Pre-op evaluation,  At surgeon's request and post-op pain management  Laterality: Right  Prep: chloraprep       Needles:  Injection technique: Single-shot  Needle Type: Echogenic Needle     Needle Length: 10cm  Needle Gauge: 21     Additional Needles:   Narrative:  Start time: 07/08/2018 11:43 AM End time: 07/08/2018 11:47 AM Injection made incrementally with aspirations every 5 mL.  Performed by: Personally  Anesthesiologist: Audry Pili, MD  Additional Notes: No pain on injection. No increased resistance to injection. Injection made in 5cc increments. Good needle visualization. Patient tolerated the procedure well.

## 2018-07-08 NOTE — H&P (Signed)
Adam Ray is an 63 y.o. male.   Chief Complaint: osteomyelitis right foot third toe HPI: Patient is seen in follow-up status post great toe and second toe amputation the right foot with osteomyelitis of the third toe at this time.  Patient had significant increased swelling the right lower extremity he did undergo an ultrasound which was negative for DVT.  Patient states he is currently disabled.   Past Medical History:  Diagnosis Date  . Allergy   . Arthritis   . Bronchitis   . Cataract   . Chronic kidney disease   . Claudication [facility])    right foot ray resection  . Colon polyps    hyperplastic  . COPD (chronic obstructive pulmonary disease) ([place])   . Diabetes mellitus   . Genital warts   . Gout   . Hyperlipidemia   . Hypertension   . Pneumonia     Past Surgical History:  Procedure Laterality Date  . AMPUTATION Right 01/31/2016   Procedure: Right 2nd Toe Amputation;  Surgeon: Newt Minion, MD;  Location: [place];  Service: Orthopedics;  Laterality: Right;  . CATARACT EXTRACTION     right eye  . COLONOSCOPY    . I&D EXTREMITY  04/11/2012   Procedure: IRRIGATION AND DEBRIDEMENT EXTREMITY;  Surgeon: Wylene Simmer, MD;  Location: [place];  Service: Orthopedics;  Laterality: Right;  . Surgery left great toe    . Tear ducts bilateral eyes      Family History  Problem Relation Age of Onset  . Diabetes Mother   . Stroke Mother   . Heart failure Father   . Colon cancer Neg Hx   . Esophageal cancer Neg Hx   . Rectal cancer Neg Hx   . Stomach cancer Neg Hx    Social History:  reports that he has been smoking cigars and cigarettes. He started smoking about 55 years ago. He has a 14.40 pack-year smoking history. He has quit using smokeless tobacco. He reports current alcohol use. He reports that he does not use drugs.  Allergies: No Known Allergies  No medications prior to admission.    No results found for this or any previous visit (from the past 48 hour(s)). No results  found.  Review of Systems  All other systems reviewed and are negative.   There were no vitals taken for this visit. Physical Exam  Patient is alert, oriented, no adenopathy, well-dressed, normal affect, normal respiratory effort. Examination patient has good capillary refill to right lower extremity  pulses not palpable due to the venous stasis swelling with pitting edema.  There is ulceration of the third toe with exposed bone with osteomyelitis.  Status post great toe and second toe amputation.  Patient is currently full weightbearing.  Discussed the need to be nonweightbearing postoperatively.  Assessment/Plan 1. Midfoot ulcer with necrosis of bone, right ([place])     Plan: We will plan for transmetatarsal amputation on the right.  Risks and benefits of surgery were discussed including risk of the wound not healing need for higher level amputation.  Patient states he understands wishes to proceed at this time.      Newt Minion, MD 07/08/2018, 6:51 AM

## 2018-07-08 NOTE — Op Note (Signed)
07/08/2018  1:30 PM  PATIENT:  Adam Ray    PRE-OPERATIVE DIAGNOSIS:  Osteomyelitis Right 3rd Toe  POST-OPERATIVE DIAGNOSIS:  Same  PROCEDURE:  RIGHT TRANSMETATARSAL AMPUTATION Application of Praveena wound VAC  SURGEON:  Newt Minion, MD  PHYSICIAN ASSISTANT:None ANESTHESIA:   General  PREOPERATIVE INDICATIONS:  Adam Ray is a  63 y.o. male with a diagnosis of Osteomyelitis Right 3rd Toe who failed conservative measures and elected for surgical management.    The risks benefits and alternatives were discussed with the patient preoperatively including but not limited to the risks of infection, bleeding, nerve injury, cardiopulmonary complications, the need for revision surgery, among others, and the patient was willing to proceed.  OPERATIVE IMPLANTS: Praveena wound VAC  @ENCIMAGES @  OPERATIVE FINDINGS: Good petechial bleeding no abscess  OPERATIVE PROCEDURE: Patient was brought to the operating room and underwent a general anesthetic.[name]  After adequate levels anesthesia were obtained patient's right lower extremity was prepped using DuraPrep draped into a sterile field a timeout was called.  A fishmouth incision was made proximal to the ulcerative tissue.  A oscillating saw was used to perform a transmetatarsal amputation beveled plantarly.  Electrocautery was used for hemostasis the wound was irrigated with normal saline there is no necrotic tissue no signs of any infection at the resection margins.  2-0 nylon was used to close the incision.  A Praveena wound VAC was applied this had a good suction fit patient was extubated taken the PACU in stable condition.   DISCHARGE PLANNING:  Antibiotic duration:24 hours  Weightbearing: touchdown right  Pain medication: opoid pathway  Dressing care/ Wound VAC:vac for 1 week  Ambulatory devices:walker  Discharge to: home after 23 hour observation  Follow-up: In the office 1 week post operative.

## 2018-07-08 NOTE — Anesthesia Preprocedure Evaluation (Addendum)
Anesthesia Evaluation  Patient identified by MRN, date of birth, ID band Patient awake    Reviewed: Allergy & Precautions, NPO status , Patient's Chart, lab work & pertinent test results  History of Anesthesia Complications Negative for: history of anesthetic complications  Airway Mallampati: II  TM Distance: >3 FB Neck ROM: Full    Dental  (+) Edentulous Upper, Missing   Pulmonary sleep apnea , COPD, Current Smoker,    breath sounds clear to auscultation       Cardiovascular Hypertension: denies. + Peripheral Vascular Disease  + Valvular Problems/Murmurs (bicuspid AV)  Rhythm:Regular Rate:Normal + Systolic murmurs    Neuro/Psych PSYCHIATRIC DISORDERS Anxiety Depression negative neurological ROS     GI/Hepatic negative GI ROS, Neg liver ROS,   Endo/Other  diabetes, Type 2, Insulin Dependent, Oral Hypoglycemic Agents  Renal/GU CRFRenal disease    Genital warts     Musculoskeletal  (+) Arthritis ,  Gout    Abdominal   Peds  Hematology  (+) Sickle cell trait ,   Anesthesia Other Findings   Reproductive/Obstetrics                           Anesthesia Physical Anesthesia Plan  ASA: III  Anesthesia Plan: General   Post-op Pain Management:  Regional for Post-op pain   Induction: Intravenous  PONV Risk Score and Plan: 2 and Treatment may vary due to age or medical condition, Ondansetron and Midazolam  Airway Management Planned: LMA  Additional Equipment: None  Intra-op Plan:   Post-operative Plan: Extubation in OR  Informed Consent: I have reviewed the patients History and Physical, chart, labs and discussed the procedure including the risks, benefits and alternatives for the proposed anesthesia with the patient or authorized representative who has indicated his/her understanding and acceptance.   Dental advisory given  Plan Discussed with: CRNA and  Anesthesiologist  Anesthesia Plan Comments:        Anesthesia Quick Evaluation

## 2018-07-08 NOTE — Plan of Care (Signed)

## 2018-07-08 NOTE — Anesthesia Postprocedure Evaluation (Signed)
Anesthesia Post Note  Patient: KEYRON POKORSKI  Procedure(s) Performed: RIGHT TRANSMETATARSAL AMPUTATION (Right )     Patient location during evaluation: PACU Anesthesia Type: General Level of consciousness: awake and alert Pain management: pain level controlled Vital Signs Assessment: post-procedure vital signs reviewed and stable Respiratory status: spontaneous breathing, nonlabored ventilation and respiratory function stable Cardiovascular status: blood pressure returned to baseline and stable Postop Assessment: no apparent nausea or vomiting Anesthetic complications: no    Last Vitals:  Vitals:   07/08/18 1150 07/08/18 1326  BP: (!) 114/57 134/75  Pulse: (!) 52 78  Resp: 17 16  Temp:  36.4 C  SpO2: 100% 100%    Last Pain:  Vitals:   07/08/18 1326  PainSc: 0-No pain                 Audry Pili

## 2018-07-08 NOTE — Discharge Instructions (Signed)

## 2018-07-08 NOTE — Transfer of Care (Signed)
Immediate Anesthesia Transfer of Care Note  Patient: Adam Ray  Procedure(s) Performed: RIGHT TRANSMETATARSAL AMPUTATION (Right )  Patient Location: PACU  Anesthesia Type:General  Level of Consciousness: awake and patient cooperative  Airway & Oxygen Therapy: Patient connected to face mask oxygen  Post-op Assessment: Report given to RN, Post -op Vital signs reviewed and stable, Patient moving all extremities X 4 and Patient able to stick tongue midline  Post vital signs: Reviewed and stable  Last Vitals:  Vitals Value Taken Time  BP 134/75 07/08/2018  1:26 PM  Temp    Pulse 81 07/08/2018  1:28 PM  Resp 17 07/08/2018  1:28 PM  SpO2 100 % 07/08/2018  1:28 PM  Vitals shown include unvalidated device data.  Last Pain:  Vitals:   07/08/18 1150  PainSc: 0-No pain         Complications: No apparent anesthesia complications

## 2018-07-09 ENCOUNTER — Encounter (HOSPITAL_COMMUNITY): Payer: Self-pay | Admitting: Orthopedic Surgery

## 2018-07-09 DIAGNOSIS — E1169 Type 2 diabetes mellitus with other specified complication: Secondary | ICD-10-CM | POA: Diagnosis not present

## 2018-07-09 LAB — GLUCOSE, CAPILLARY: Glucose-Capillary: 225 mg/dL — ABNORMAL HIGH (ref 70–99)

## 2018-07-09 MED ORDER — OXYCODONE-ACETAMINOPHEN 5-325 MG PO TABS: 1.0000 | ORAL_TABLET | ORAL | 0 refills | Status: DC | PRN

## 2018-07-09 NOTE — Discharge Summary (Signed)
Discharge Diagnoses:  Active Problems:   Osteomyelitis of third toe of right foot ([place])   Status post transmetatarsal amputation of foot, right [facility])   Surgeries: Procedure(s): RIGHT TRANSMETATARSAL AMPUTATION on 07/08/2018    Consultants:   Discharged Condition: Improved  Hospital Course: Adam Ray is an 63 y.o. male who was admitted 07/08/2018 with a chief complaint of osteomyelitis right foot, with a final diagnosis of Osteomyelitis Right 3rd Toe.  Patient was brought to the operating room on 07/08/2018 and underwent Procedure(s): RIGHT TRANSMETATARSAL AMPUTATION.    Patient was given perioperative antibiotics:  Anti-infectives (From admission, onward)   Start     Dose/Rate Route Frequency Ordered Stop   07/08/18 1830  ceFAZolin (ANCEF) IVPB 1 g/50 mL premix     1 g 100 mL/hr over 30 Minutes Intravenous Every 6 hours 07/08/18 1601 07/09/18 0557   07/08/18 0900  ceFAZolin (ANCEF) IVPB 2g/100 mL premix     2 g 200 mL/hr over 30 Minutes Intravenous On call to O.R. 07/08/18 5643 07/08/18 1303    .  Patient was given sequential compression devices, early ambulation, and aspirin for DVT prophylaxis.  Recent vital signs:  Patient Vitals for the past 24 hrs:  BP Temp Temp src Pulse Resp SpO2 Height Weight  07/09/18 0523 121/60 99.3 F (37.4 C) Oral 60 16 100 % - -  07/09/18 0030 120/70 98.1 F (36.7 C) Oral 60 14 98 % - -  07/08/18 2014 118/64 97.8 F (36.6 C) Oral (!) 56 14 99 % - -  07/08/18 1559 140/71 (!) 97.4 F (36.3 C) Oral (!) 51 - 100 % - -  07/08/18 1530 (!) 158/80 97.9 F (36.6 C) - - 18 - - -  07/08/18 1410 (!) 162/83 - - (!) 56 17 100 % - -  07/08/18 1354 (!) 162/88 - - 70 15 100 % - -  07/08/18 1340 136/77 - - 68 (!) 26 98 % - -  07/08/18 1326 134/75 97.6 F (36.4 C) - 78 16 100 % - -  07/08/18 1150 (!) 114/57 - - (!) 52 17 100 % - -  07/08/18 1145 (!) 104/59 - - (!) 49 16 100 % - -  07/08/18 1140 126/61 - - (!) 53 18 100 % - -  07/08/18 0909 - - - - - - 5'  10" (1.778 m) 77.1 kg  07/08/18 0856 (!) 147/74 97.8 F (36.6 C) - (!) 57 16 100 % - -  .  Recent laboratory studies: No results found.  Discharge Medications:   Allergies as of 07/09/2018   No Known Allergies     Medication List    TAKE these medications   acetaminophen 500 MG tablet Commonly known as:  TYLENOL Take 500-1,500 mg by mouth every 6 (six) hours as needed (for pain.).   aspirin EC 81 MG tablet Take 81 mg by mouth daily.   insulin aspart 100 UNIT/ML injection Commonly known as:  novoLOG Inject 40 Units into the skin 3 (three) times daily before meals.   LANTUS SOLOSTAR 100 UNIT/ML Solostar Pen Generic drug:  Insulin Glargine Inject 40 Units into the skin 2 (two) times daily.   metFORMIN 500 MG tablet Commonly known as:  GLUCOPHAGE Take 1,000 mg by mouth 2 (two) times daily.   multivitamin with minerals Tabs tablet Take 2 tablets by mouth 2 (two) times daily. COMPLETE MULTIVITAMIN   oxyCODONE-acetaminophen 5-325 MG tablet Commonly known as:  PERCOCET/ROXICET Take 1 tablet by mouth every 4 (  four) hours as needed.       Diagnostic Studies: Dg Chest 2 View  Result Date: 06/20/2018 CLINICAL DATA:  Cough EXAM: CHEST - 2 VIEW COMPARISON:  11/17/2017 FINDINGS: The heart size and mediastinal contours are within normal limits. Both lungs are clear. Degenerative changes of the spine. IMPRESSION: No active cardiopulmonary disease. Electronically Signed   By: Donavan Foil M.D.   On: 06/20/2018 16:50   Vas [place] Lower Extremity Venous (dvt) (only Mc & Wl 7a-7p)  Result Date: 06/26/2018  Lower Venous Study Indications: Swelling.  Limitations: Poor ultrasound/tissue interface. Performing Technologist: Oliver Hum RVT  Examination Guidelines: A complete evaluation includes B-mode imaging, spectral Doppler, color Doppler, and power Doppler as needed of all accessible portions of each vessel. Bilateral testing is considered an integral part of a complete examination.  Limited examinations for reoccurring indications may be performed as noted.  Right Venous Findings: +---------+---------------+---------+-----------+----------+-------+          CompressibilityPhasicitySpontaneityPropertiesSummary +---------+---------------+---------+-----------+----------+-------+ CFV      Full           Yes      Yes                          +---------+---------------+---------+-----------+----------+-------+ SFJ      Full                                                 +---------+---------------+---------+-----------+----------+-------+ FV Prox  Full                                                 +---------+---------------+---------+-----------+----------+-------+ FV Mid   Full                                                 +---------+---------------+---------+-----------+----------+-------+ FV DistalFull                                                 +---------+---------------+---------+-----------+----------+-------+ PFV      Full                                                 +---------+---------------+---------+-----------+----------+-------+ POP      Full           Yes      Yes                          +---------+---------------+---------+-----------+----------+-------+ PTV      Full                                                 +---------+---------------+---------+-----------+----------+-------+ PERO     Full                                                 +---------+---------------+---------+-----------+----------+-------+  Left Venous Findings: +---+---------------+---------+-----------+----------+-------+    CompressibilityPhasicitySpontaneityPropertiesSummary +---+---------------+---------+-----------+----------+-------+ CFVFull           Yes      Yes                          +---+---------------+---------+-----------+----------+-------+    Summary: Right: There is no evidence of deep vein  thrombosis in the lower extremity. However, portions of this examination were limited- see technologist comments above. No cystic structure found in the popliteal fossa. Left: No evidence of common femoral vein obstruction.  *See table(s) above for measurements and observations. Electronically signed by Curt Jews MD on 06/26/2018 at 2:29:33 PM.    Final     Patient benefited maximally from their hospital stay and there were no complications.     Disposition: Discharge disposition: 01-Home or Self Care      Discharge Instructions    Call MD / Call 911   Complete by:  As directed    If you experience chest pain or shortness of breath, CALL 911 and be transported to the hospital emergency room.  If you develope a fever above 101 F, pus (white drainage) or increased drainage or redness at the wound, or calf pain, call your surgeon's office.   Constipation Prevention   Complete by:  As directed    Drink plenty of fluids.  Prune juice may be helpful.  You may use a stool softener, such as Colace (over the counter) 100 mg twice a day.  Use MiraLax (over the counter) for constipation as needed.   Diet - low sodium heart healthy   Complete by:  As directed    Increase activity slowly as tolerated   Complete by:  As directed      Follow-up Information    Newt Minion, MD In 1 week.   Specialty:  Orthopedic Surgery Contact information: [place] [address] [phone]            Signed: Newt Minion 07/09/2018, 7:06 AM

## 2018-07-09 NOTE — Progress Notes (Signed)
PT Cancellation Note  Patient Details Name: Adam Ray MRN: [id number] DOB: Nov 05, 1955   Cancelled Treatment:    Reason Eval/Treat Not Completed: Other (comment);Patient declined, no reason specified; refused all meds and also therapy per RN; pt to d/c this am    [facility] 07/09/2018, 8:48 AM

## 2018-07-09 NOTE — Progress Notes (Signed)
Patient discharging home. Patient refused all morning medicine stating that he will take them when he gets home. He also refused therapy. MD notified. Wound Vac switched to prevena. Discharge instructions expalined to patient and wife and they both verbalized understanding. IV D/c'd. Took all personal belongings. No further questions or concerns voiced.

## 2018-07-12 ENCOUNTER — Inpatient Hospital Stay (HOSPITAL_COMMUNITY)
Admission: EM | Admit: 2018-07-12 | Discharge: 2018-07-15 | DRG: 247 | Disposition: A | Discharge status: Home / Self Care | Payer: Medicare HMO | Attending: Cardiovascular Disease | Admitting: Cardiovascular Disease

## 2018-07-12 ENCOUNTER — Encounter (HOSPITAL_COMMUNITY): Payer: Self-pay | Admitting: Emergency Medicine

## 2018-07-12 ENCOUNTER — Encounter (HOSPITAL_COMMUNITY)
Admission: EM | Disposition: A | Discharge status: Home / Self Care | Payer: Self-pay | Source: Home / Self Care | Attending: Cardiovascular Disease

## 2018-07-12 DIAGNOSIS — Z4781 Encounter for orthopedic aftercare following surgical amputation: Secondary | ICD-10-CM

## 2018-07-12 DIAGNOSIS — I959 Hypotension, unspecified: Secondary | ICD-10-CM | POA: Diagnosis present

## 2018-07-12 DIAGNOSIS — Z833 Family history of diabetes mellitus: Secondary | ICD-10-CM

## 2018-07-12 DIAGNOSIS — Z720 Tobacco use: Secondary | ICD-10-CM | POA: Diagnosis present

## 2018-07-12 DIAGNOSIS — R001 Bradycardia, unspecified: Secondary | ICD-10-CM | POA: Diagnosis not present

## 2018-07-12 DIAGNOSIS — R0789 Other chest pain: Secondary | ICD-10-CM | POA: Diagnosis not present

## 2018-07-12 DIAGNOSIS — I152 Hypertension secondary to endocrine disorders: Secondary | ICD-10-CM | POA: Diagnosis present

## 2018-07-12 DIAGNOSIS — Z7982 Long term (current) use of aspirin: Secondary | ICD-10-CM | POA: Diagnosis not present

## 2018-07-12 DIAGNOSIS — I499 Cardiac arrhythmia, unspecified: Secondary | ICD-10-CM | POA: Diagnosis not present

## 2018-07-12 DIAGNOSIS — I340 Nonrheumatic mitral (valve) insufficiency: Secondary | ICD-10-CM | POA: Diagnosis not present

## 2018-07-12 DIAGNOSIS — I2510 Atherosclerotic heart disease of native coronary artery without angina pectoris: Secondary | ICD-10-CM | POA: Diagnosis not present

## 2018-07-12 DIAGNOSIS — J449 Chronic obstructive pulmonary disease, unspecified: Secondary | ICD-10-CM | POA: Diagnosis present

## 2018-07-12 DIAGNOSIS — Z794 Long term (current) use of insulin: Secondary | ICD-10-CM | POA: Diagnosis not present

## 2018-07-12 DIAGNOSIS — E1142 Type 2 diabetes mellitus with diabetic polyneuropathy: Secondary | ICD-10-CM | POA: Diagnosis present

## 2018-07-12 DIAGNOSIS — Z955 Presence of coronary angioplasty implant and graft: Secondary | ICD-10-CM

## 2018-07-12 DIAGNOSIS — Z823 Family history of stroke: Secondary | ICD-10-CM | POA: Diagnosis not present

## 2018-07-12 DIAGNOSIS — E161 Other hypoglycemia: Secondary | ICD-10-CM | POA: Diagnosis not present

## 2018-07-12 DIAGNOSIS — I2121 ST elevation (STEMI) myocardial infarction involving left circumflex coronary artery: Secondary | ICD-10-CM | POA: Diagnosis not present

## 2018-07-12 DIAGNOSIS — E1151 Type 2 diabetes mellitus with diabetic peripheral angiopathy without gangrene: Secondary | ICD-10-CM | POA: Diagnosis present

## 2018-07-12 DIAGNOSIS — Z8249 Family history of ischemic heart disease and other diseases of the circulatory system: Secondary | ICD-10-CM | POA: Diagnosis not present

## 2018-07-12 DIAGNOSIS — I10 Essential (primary) hypertension: Secondary | ICD-10-CM | POA: Diagnosis not present

## 2018-07-12 DIAGNOSIS — E1149 Type 2 diabetes mellitus with other diabetic neurological complication: Secondary | ICD-10-CM

## 2018-07-12 DIAGNOSIS — I213 ST elevation (STEMI) myocardial infarction of unspecified site: Secondary | ICD-10-CM

## 2018-07-12 DIAGNOSIS — I739 Peripheral vascular disease, unspecified: Secondary | ICD-10-CM

## 2018-07-12 DIAGNOSIS — Z89431 Acquired absence of right foot: Secondary | ICD-10-CM | POA: Diagnosis not present

## 2018-07-12 DIAGNOSIS — E11319 Type 2 diabetes mellitus with unspecified diabetic retinopathy without macular edema: Secondary | ICD-10-CM | POA: Diagnosis present

## 2018-07-12 DIAGNOSIS — F17210 Nicotine dependence, cigarettes, uncomplicated: Secondary | ICD-10-CM | POA: Diagnosis present

## 2018-07-12 DIAGNOSIS — E162 Hypoglycemia, unspecified: Secondary | ICD-10-CM | POA: Diagnosis not present

## 2018-07-12 DIAGNOSIS — E785 Hyperlipidemia, unspecified: Secondary | ICD-10-CM | POA: Diagnosis present

## 2018-07-12 HISTORY — PX: LEFT HEART CATH AND CORONARY ANGIOGRAPHY: CATH118249

## 2018-07-12 HISTORY — PX: CORONARY STENT INTERVENTION: CATH118234

## 2018-07-12 HISTORY — PX: CORONARY/GRAFT ACUTE MI REVASCULARIZATION: CATH118305

## 2018-07-12 HISTORY — DX: ST elevation (STEMI) myocardial infarction involving left circumflex coronary artery: I21.21

## 2018-07-12 LAB — CBC WITH DIFFERENTIAL/PLATELET
Abs Immature Granulocytes: 0.08 10*3/uL — ABNORMAL HIGH (ref 0.00–0.07)
Basophils Absolute: 0.1 10*3/uL (ref 0.0–0.1)
Basophils Relative: 1 %
Eosinophils Absolute: 0.2 10*3/uL (ref 0.0–0.5)
Eosinophils Relative: 5 %
HCT: 40.9 % (ref 39.0–52.0)
Hemoglobin: 13.2 g/dL (ref 13.0–17.0)
IMMATURE GRANULOCYTES: 2 %
LYMPHS PCT: 47 %
Lymphs Abs: 2.4 10*3/uL (ref 0.7–4.0)
MCH: 29.5 pg (ref 26.0–34.0)
MCHC: 32.3 g/dL (ref 30.0–36.0)
MCV: 91.3 fL (ref 80.0–100.0)
Monocytes Absolute: 0.7 10*3/uL (ref 0.1–1.0)
Monocytes Relative: 13 %
Neutro Abs: 1.6 10*3/uL — ABNORMAL LOW (ref 1.7–7.7)
Neutrophils Relative %: 32 %
Platelets: 129 10*3/uL — ABNORMAL LOW (ref 150–400)
RBC: 4.48 MIL/uL (ref 4.22–5.81)
RDW: 13.4 % (ref 11.5–15.5)
WBC: 4.9 10*3/uL (ref 4.0–10.5)
nRBC: 0 % (ref 0.0–0.2)

## 2018-07-12 LAB — LIPID PANEL
Cholesterol: 131 mg/dL (ref 0–200)
HDL: 48 mg/dL (ref >40)
LDL Cholesterol: 44 mg/dL (ref 0–99)
Total CHOL/HDL Ratio: 2.7 RATIO
Triglycerides: 193 mg/dL — ABNORMAL HIGH (ref <150)
VLDL: 39 mg/dL (ref 0–40)

## 2018-07-12 LAB — POCT I-STAT, CHEM 8
BUN: 29 mg/dL — ABNORMAL HIGH (ref 8–23)
Calcium, Ion: 1.21 mmol/L (ref 1.15–1.40)
Chloride: 102 mmol/L (ref 98–111)
Creatinine, Ser: 1.3 mg/dL — ABNORMAL HIGH (ref 0.61–1.24)
Glucose, Bld: 72 mg/dL (ref 70–99)
HCT: 41 % (ref 39.0–52.0)
HEMOGLOBIN: 13.9 g/dL (ref 13.0–17.0)
Potassium: 4.3 mmol/L (ref 3.5–5.1)
Sodium: 140 mmol/L (ref 135–145)
TCO2: 30 mmol/L (ref 22–32)

## 2018-07-12 LAB — COMPREHENSIVE METABOLIC PANEL
ALT: 24 U/L (ref 0–44)
ANION GAP: 7 (ref 5–15)
AST: 27 U/L (ref 15–41)
Albumin: 3.5 g/dL (ref 3.5–5.0)
Alkaline Phosphatase: 73 U/L (ref 38–126)
BUN: 22 mg/dL (ref 8–23)
CO2: 28 mmol/L (ref 22–32)
Calcium: 9 mg/dL (ref 8.9–10.3)
Chloride: 105 mmol/L (ref 98–111)
Creatinine, Ser: 1.24 mg/dL (ref 0.61–1.24)
GFR calc Af Amer: >60 mL/min (ref >60)
GFR calc non Af Amer: >60 mL/min (ref >60)
Glucose, Bld: 75 mg/dL (ref 70–99)
POTASSIUM: 4.4 mmol/L (ref 3.5–5.1)
Sodium: 140 mmol/L (ref 135–145)
Total Bilirubin: 1.1 mg/dL (ref 0.3–1.2)
Total Protein: 6 g/dL — ABNORMAL LOW (ref 6.5–8.1)

## 2018-07-12 LAB — HEMOGLOBIN A1C
Hgb A1c MFr Bld: 7 % — ABNORMAL HIGH (ref 4.8–5.6)
MEAN PLASMA GLUCOSE: 154.2 mg/dL

## 2018-07-12 LAB — PROTIME-INR
INR: 1.03
PROTHROMBIN TIME: 13.4 s (ref 11.4–15.2)

## 2018-07-12 LAB — TSH: TSH: 1.131 u[IU]/mL (ref 0.350–4.500)

## 2018-07-12 LAB — TROPONIN I: Troponin I: 0.12 ng/mL (ref <0.03)

## 2018-07-12 LAB — MRSA PCR SCREENING: MRSA by PCR: NEGATIVE

## 2018-07-12 LAB — GLUCOSE, CAPILLARY
Glucose-Capillary: 57 mg/dL — ABNORMAL LOW (ref 70–99)
Glucose-Capillary: 80 mg/dL (ref 70–99)

## 2018-07-12 LAB — APTT: aPTT: 29 seconds (ref 24–36)

## 2018-07-12 LAB — POCT I-STAT TROPONIN I: Troponin i, poc: 0.01 ng/mL (ref 0.00–0.08)

## 2018-07-12 SURGERY — CORONARY/GRAFT ACUTE MI REVASCULARIZATION
Anesthesia: LOCAL

## 2018-07-12 MED ORDER — INSULIN ASPART 100 UNIT/ML ~~LOC~~ SOLN: 40.0000 [IU] | Freq: Three times a day (TID) | SUBCUTANEOUS | Status: DC

## 2018-07-12 MED ORDER — HEPARIN (PORCINE) IN NACL 1000-0.9 UT/500ML-% IV SOLN
INTRAVENOUS | Status: AC
  Filled 2018-07-12: qty 1000

## 2018-07-12 MED ORDER — VERAPAMIL HCL 2.5 MG/ML IV SOLN
INTRAVENOUS | Status: DC | PRN
  Administered 2018-07-12: 10 mL via INTRA_ARTERIAL

## 2018-07-12 MED ORDER — ACETAMINOPHEN 325 MG PO TABS
650.0000 mg | ORAL_TABLET | ORAL | Status: DC | PRN
  Administered 2018-07-14 (×2): 650 mg via ORAL
  Filled 2018-07-12 (×2): qty 2

## 2018-07-12 MED ORDER — ASPIRIN 81 MG PO CHEW: 81.0000 mg | CHEWABLE_TABLET | Freq: Every day | ORAL | Status: DC

## 2018-07-12 MED ORDER — TICAGRELOR 90 MG PO TABS
ORAL_TABLET | ORAL | Status: AC
  Filled 2018-07-12: qty 2

## 2018-07-12 MED ORDER — SODIUM CHLORIDE 0.9 % IV SOLN: 250.0000 mL | INTRAVENOUS | Status: DC | PRN

## 2018-07-12 MED ORDER — FENTANYL CITRATE (PF) 100 MCG/2ML IJ SOLN
INTRAMUSCULAR | Status: DC | PRN
  Administered 2018-07-12 (×2): 25 ug via INTRAVENOUS

## 2018-07-12 MED ORDER — BIVALIRUDIN TRIFLUOROACETATE 250 MG IV SOLR
INTRAVENOUS | Status: AC
  Filled 2018-07-12: qty 250

## 2018-07-12 MED ORDER — INSULIN ASPART 100 UNIT/ML ~~LOC~~ SOLN: 0.0000 [IU] | Freq: Every day | SUBCUTANEOUS | Status: DC

## 2018-07-12 MED ORDER — LIDOCAINE HCL (PF) 1 % IJ SOLN
INTRAMUSCULAR | Status: AC
  Filled 2018-07-12: qty 30

## 2018-07-12 MED ORDER — OXYCODONE-ACETAMINOPHEN 5-325 MG PO TABS
1.0000 | ORAL_TABLET | ORAL | Status: DC | PRN
  Administered 2018-07-12: 1 via ORAL
  Filled 2018-07-12: qty 1

## 2018-07-12 MED ORDER — MIDAZOLAM HCL 2 MG/2ML IJ SOLN
INTRAMUSCULAR | Status: DC | PRN
  Administered 2018-07-12: 2 mg via INTRAVENOUS

## 2018-07-12 MED ORDER — SODIUM CHLORIDE 0.9% FLUSH: 3.0000 mL | INTRAVENOUS | Status: DC | PRN

## 2018-07-12 MED ORDER — NITROGLYCERIN 0.4 MG SL SUBL: 0.4000 mg | SUBLINGUAL_TABLET | SUBLINGUAL | Status: DC | PRN

## 2018-07-12 MED ORDER — VERAPAMIL HCL 2.5 MG/ML IV SOLN
INTRAVENOUS | Status: AC
  Filled 2018-07-12: qty 2

## 2018-07-12 MED ORDER — SODIUM CHLORIDE 0.9 % IV SOLN: INTRAVENOUS | Status: DC

## 2018-07-12 MED ORDER — MORPHINE SULFATE (PF) 4 MG/ML IV SOLN
INTRAVENOUS | Status: AC
  Administered 2018-07-12: 4 mg
  Filled 2018-07-12: qty 1

## 2018-07-12 MED ORDER — FENTANYL CITRATE (PF) 100 MCG/2ML IJ SOLN
INTRAMUSCULAR | Status: AC
  Filled 2018-07-12: qty 2

## 2018-07-12 MED ORDER — ASPIRIN EC 81 MG PO TBEC: 81.0000 mg | DELAYED_RELEASE_TABLET | Freq: Every day | ORAL | Status: DC

## 2018-07-12 MED ORDER — NITROGLYCERIN 1 MG/10 ML FOR IR/CATH LAB
INTRA_ARTERIAL | Status: DC | PRN
  Administered 2018-07-12: 100 ug via INTRACORONARY

## 2018-07-12 MED ORDER — LIDOCAINE HCL (PF) 1 % IJ SOLN
INTRAMUSCULAR | Status: DC | PRN
  Administered 2018-07-12: 2 mL

## 2018-07-12 MED ORDER — DIAZEPAM 5 MG PO TABS: 5.0000 mg | ORAL_TABLET | Freq: Four times a day (QID) | ORAL | Status: DC | PRN

## 2018-07-12 MED ORDER — TICAGRELOR 90 MG PO TABS
90.0000 mg | ORAL_TABLET | Freq: Two times a day (BID) | ORAL | Status: DC
  Administered 2018-07-13 – 2018-07-15 (×5): 90 mg via ORAL
  Filled 2018-07-12 (×5): qty 1

## 2018-07-12 MED ORDER — NITROGLYCERIN 1 MG/10 ML FOR IR/CATH LAB
INTRA_ARTERIAL | Status: AC
  Filled 2018-07-12: qty 10

## 2018-07-12 MED ORDER — HEPARIN SODIUM (PORCINE) 5000 UNIT/ML IJ SOLN
4000.0000 [IU] | Freq: Once | INTRAMUSCULAR | Status: AC
  Administered 2018-07-12: 4000 [IU] via INTRAVENOUS
  Filled 2018-07-12: qty 1

## 2018-07-12 MED ORDER — TICAGRELOR 90 MG PO TABS
ORAL_TABLET | ORAL | Status: DC | PRN
  Administered 2018-07-12: 180 mg via ORAL

## 2018-07-12 MED ORDER — SODIUM CHLORIDE 0.9 % IV SOLN
INTRAVENOUS | Status: AC | PRN
  Administered 2018-07-12: 1.75 mg/kg/h via INTRAVENOUS

## 2018-07-12 MED ORDER — ASPIRIN 300 MG RE SUPP: 300.0000 mg | RECTAL | Status: DC

## 2018-07-12 MED ORDER — HEPARIN (PORCINE) IN NACL 1000-0.9 UT/500ML-% IV SOLN
INTRAVENOUS | Status: DC | PRN
  Administered 2018-07-12: 500 mL

## 2018-07-12 MED ORDER — HYDRALAZINE HCL 20 MG/ML IJ SOLN: 5.0000 mg | INTRAMUSCULAR | Status: AC | PRN

## 2018-07-12 MED ORDER — ASPIRIN 81 MG PO CHEW: 324.0000 mg | CHEWABLE_TABLET | ORAL | Status: DC

## 2018-07-12 MED ORDER — SODIUM CHLORIDE 0.9% FLUSH
3.0000 mL | Freq: Two times a day (BID) | INTRAVENOUS | Status: DC
  Administered 2018-07-13 – 2018-07-14 (×4): 3 mL via INTRAVENOUS

## 2018-07-12 MED ORDER — BIVALIRUDIN BOLUS VIA INFUSION - CUPID
INTRAVENOUS | Status: DC | PRN
  Administered 2018-07-12: 57.825 mg via INTRAVENOUS

## 2018-07-12 MED ORDER — ATORVASTATIN CALCIUM 80 MG PO TABS: 80.0000 mg | ORAL_TABLET | Freq: Every day | ORAL | Status: DC

## 2018-07-12 MED ORDER — ONDANSETRON HCL 4 MG/2ML IJ SOLN: 4.0000 mg | Freq: Four times a day (QID) | INTRAMUSCULAR | Status: DC | PRN

## 2018-07-12 MED ORDER — INSULIN GLARGINE 100 UNIT/ML ~~LOC~~ SOLN
40.0000 [IU] | Freq: Two times a day (BID) | SUBCUTANEOUS | Status: DC
  Administered 2018-07-12 – 2018-07-15 (×6): 40 [IU] via SUBCUTANEOUS
  Filled 2018-07-12 (×6): qty 0.4

## 2018-07-12 MED ORDER — INSULIN ASPART 100 UNIT/ML ~~LOC~~ SOLN
0.0000 [IU] | Freq: Three times a day (TID) | SUBCUTANEOUS | Status: DC
  Administered 2018-07-14 – 2018-07-15 (×2): 3 [IU] via SUBCUTANEOUS

## 2018-07-12 MED ORDER — ASPIRIN EC 81 MG PO TBEC
81.0000 mg | DELAYED_RELEASE_TABLET | Freq: Every day | ORAL | Status: DC
  Administered 2018-07-13 – 2018-07-15 (×3): 81 mg via ORAL
  Filled 2018-07-12 (×3): qty 1

## 2018-07-12 MED ORDER — SODIUM CHLORIDE 0.9 % IV SOLN
INTRAVENOUS | Status: DC
  Administered 2018-07-12: 23:00:00 via INTRAVENOUS

## 2018-07-12 MED ORDER — ACETAMINOPHEN 325 MG PO TABS: 650.0000 mg | ORAL_TABLET | ORAL | Status: DC | PRN

## 2018-07-12 MED ORDER — LABETALOL HCL 5 MG/ML IV SOLN: 10.0000 mg | INTRAVENOUS | Status: AC | PRN

## 2018-07-12 MED ORDER — SODIUM CHLORIDE 0.9 % IV SOLN
INTRAVENOUS | Status: AC | PRN
  Administered 2018-07-12: 150 mL/h via INTRAVENOUS

## 2018-07-12 MED ORDER — MIDAZOLAM HCL 2 MG/2ML IJ SOLN
INTRAMUSCULAR | Status: AC
  Filled 2018-07-12: qty 2

## 2018-07-12 MED ORDER — ATORVASTATIN CALCIUM 80 MG PO TABS
80.0000 mg | ORAL_TABLET | Freq: Every day | ORAL | Status: DC
  Administered 2018-07-13 – 2018-07-14 (×2): 80 mg via ORAL
  Filled 2018-07-12 (×2): qty 1

## 2018-07-12 MED ORDER — ADULT MULTIVITAMIN W/MINERALS CH
2.0000 | ORAL_TABLET | Freq: Two times a day (BID) | ORAL | Status: DC
  Administered 2018-07-12 – 2018-07-15 (×6): 2 via ORAL
  Filled 2018-07-12 (×7): qty 2

## 2018-07-12 SURGICAL SUPPLY — 16 items
BALLN SAPPHIRE 2.0X12 (BALLOONS) ×2
BALLN SAPPHIRE ~~LOC~~ 2.5X15 (BALLOONS) ×1 IMPLANT
BALLOON SAPPHIRE 2.0X12 (BALLOONS) IMPLANT
CATH OPTITORQUE TIG 4.0 5F (CATHETERS) ×1 IMPLANT
CATH VISTA GUIDE 6FR XB3.5 (CATHETERS) ×1 IMPLANT
DEVICE RAD COMP TR BAND LRG (VASCULAR PRODUCTS) ×1 IMPLANT
GLIDESHEATH SLEND SS 6F .021 (SHEATH) ×1 IMPLANT
GUIDEWIRE INQWIRE 1.5J.035X260 (WIRE) IMPLANT
INQWIRE 1.5J .035X260CM (WIRE) ×2
KIT ENCORE 26 ADVANTAGE (KITS) ×1 IMPLANT
KIT HEART LEFT (KITS) ×2 IMPLANT
PACK CARDIAC CATHETERIZATION (CUSTOM PROCEDURE TRAY) ×2 IMPLANT
STENT SYNERGY DES 2.25X24 (Permanent Stent) ×1 IMPLANT
TRANSDUCER W/STOPCOCK (MISCELLANEOUS) ×2 IMPLANT
TUBING CIL FLEX 10 FLL-RA (TUBING) ×2 IMPLANT
WIRE PT2 LS 185 (WIRE) ×1 IMPLANT

## 2018-07-12 NOTE — Progress Notes (Signed)
Chaplain responded to Code STEMI.  Patient was being cared for by staff.  Chaplain waited for wife's arrival.  Escorted wife to [facility] waiting area and provided ministry of presence. Will continue to follow. Tamsen Snider  Pager [phone]

## 2018-07-12 NOTE — ED Provider Notes (Addendum)
[facility] CATH LAB Provider Note   CSN: [id number] Arrival date & time: 07/12/18  1742     History   Chief Complaint Chief Complaint  Patient presents with  . Code STEMI    HPI Adam Ray is a 63 y.o. male.  HPI Patient presents via EMS with concern for STEMI.  History provided by the patient and EMS providers. About 1 hour prior to ED arrival the patient developed chest pressure, left-sided, radiating down his left arm. Since onset chest pressure is been persistent, but the left arm pain has improved. Patient has received aspirin. Reported the patient had mild bradycardia, 40s and 50s, with hypotension, systolic about 629 on arrival. Blood pressure improved in route, with a systolic just prior to arrival of 528 systolic. Patient self notes that he continues to have pain, 8/10, described as pressure-like. Patient has a notable history of diabetes, and amputation of 3 digits on his right foot, performed 4 days ago. He has no known cardiac history, denies seeing a cardiologist ever. Past Medical History:  Diagnosis Date  . Allergy   . Arthritis   . Bronchitis   . Cataract   . Chronic kidney disease   . Claudication [facility])    right foot ray resection  . Colon polyps    hyperplastic  . COPD (chronic obstructive pulmonary disease) ([place])   . Diabetes mellitus   . Genital warts   . Gout   . Hyperlipidemia   . Hypertension   . Pneumonia     Patient Active Problem List   Diagnosis Date Noted  . Status post transmetatarsal amputation of foot, right ([place]) 07/08/2018  . Osteomyelitis of third toe of right foot ([place])   . Limited mobility 05/12/2018  . Gingival foreign body 07/22/2017  . Anxiety state 06/02/2017  . Coated tongue 04/03/2017  . Leg swelling 01/29/2017  . Status post amputation of toe of right foot ([place]) 09/24/2016  . Idiopathic chronic venous hypertension of both lower extremities with inflammation 09/24/2016  . Onychomycosis  09/24/2016  . Midfoot skin ulcer, right, limited to breakdown of skin ([place]) 09/24/2016  . Testicular mass 04/18/2016  . Thrush 04/18/2016  . Soft tissue swelling   . Osteomyelitis ([place]) 01/29/2016  . Diabetic polyneuropathy associated with type 2 diabetes mellitus ([place])   . Type 2 diabetes mellitus with neurologic complication, with long-term current use of insulin ([place]) 12/08/2015  . Left shoulder pain 08/10/2015  . Pulmonary nodule 02/01/2015  . Screening for colon cancer 01/09/2015  . Lumbar back pain 03/21/2014  . Screening for STD (sexually transmitted disease) 12/15/2013  . Depression 12/09/2013  . Lightheadedness 11/19/2013  . Warts, genital 08/20/2013  . 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 Right leg pain 04/27/2013  . Diabetic retinopathy ([place]) 01/28/2013  . Visit for well man health check 11/19/2010  . Sleep apnea 09/06/2010  . 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 LEG EDEMA 03/03/2009  . Essential hypertension 11/09/2008  . COPD, mild ([place]) 10/06/2006  . SICKLE-CELL TRAIT 04/26/2006  . ERECTILE DYSFUNCTION 04/26/2006  . Tobacco abuse 04/26/2006  . Peripheral vascular disease ([place]) 04/26/2006  . ALLERGIC RHINITIS 04/26/2006    Past Surgical History:  Procedure Laterality Date  . AMPUTATION Right 01/31/2016   Procedure: Right 2nd Toe Amputation;  Surgeon: Newt Minion, MD;  Location: [facility];  Service: Orthopedics;  Laterality: Right;  . AMPUTATION Right 07/08/2018   Procedure: RIGHT TRANSMETATARSAL AMPUTATION;  Surgeon: Newt Minion, MD;  Location:  [facility];  Service: Orthopedics;  Laterality: Right;  . CATARACT EXTRACTION     right eye  . COLONOSCOPY    . I&D EXTREMITY  04/11/2012   Procedure: IRRIGATION AND DEBRIDEMENT EXTREMITY;  Surgeon: Wylene Simmer, MD;  Location: [facility];  Service: Orthopedics;  Laterality: Right;  . Surgery left great toe    . Tear ducts bilateral eyes    . TRANSMETATARSAL AMPUTATION Right 07/08/2018         Home Medications    Prior to Admission medications   Medication Sig Start Date End Date Taking? Authorizing Provider  acetaminophen (TYLENOL) 500 MG tablet Take 500-1,500 mg by mouth every 6 (six) hours as needed (for pain.).    [provider]  aspirin EC 81 MG tablet Take 81 mg by mouth daily.    [provider]  insulin aspart (NOVOLOG) 100 UNIT/ML injection Inject 40 Units into the skin 3 (three) times daily before meals.    [provider]  Insulin Glargine (LANTUS SOLOSTAR) 100 UNIT/ML Solostar Pen Inject 40 Units into the skin 2 (two) times daily.    [provider]  metFORMIN (GLUCOPHAGE) 500 MG tablet Take 1,000 mg by mouth 2 (two) times daily.    [provider]  Multiple Vitamin (MULTIVITAMIN WITH MINERALS) TABS tablet Take 2 tablets by mouth 2 (two) times daily. COMPLETE MULTIVITAMIN    [provider]  oxyCODONE-acetaminophen (PERCOCET/ROXICET) 5-325 MG tablet Take 1 tablet by mouth every 4 (four) hours as needed. 07/09/18   Newt Minion, MD  levofloxacin (LEVAQUIN) 750 MG tablet Take 1 tablet (750 mg total) by mouth daily. For 14 days 07/07/12 07/24/12  Verdie Drown, Samuel Germany, MD    Family History Family History  Problem Relation Age of Onset  . Diabetes Mother   . Stroke Mother   . Heart failure Father   . Colon cancer Neg Hx   . Esophageal cancer Neg Hx   . Rectal cancer Neg Hx   . Stomach cancer Neg Hx     Social History Social History   Tobacco Use  . Smoking status: Current Every Day Smoker    Packs/day: 0.30    Years: 48.00    Pack years: 14.40    Types: Cigars, Cigarettes    Start date: 07/02/1963  . Smokeless tobacco: Former Systems developer  . Tobacco comment: wants to use electric cigarettes.  not interested in pills, worried about chantix side effects  Substance Use Topics  . Alcohol use: Yes    Alcohol/week: 0.0 standard drinks    Comment: occassional use  . Drug use: No     Allergies   Patient has  no known allergies.   Review of Systems Review of Systems  Constitutional: Positive for diaphoresis.  HENT:       Per HPI, otherwise negative  Respiratory:       Per HPI, otherwise negative  Cardiovascular:       Per HPI, otherwise negative  Gastrointestinal: Negative for vomiting.  Endocrine:       Negative aside from HPI  Genitourinary:       Neg aside from HPI   Musculoskeletal:       Per HPI, otherwise negative  Skin: Positive for wound.  Allergic/Immunologic: Negative for immunocompromised state.  Neurological: Positive for weakness. Negative for syncope.     Physical Exam Updated Vital Signs BP 136/74   Pulse (!) 51   Resp 12   Ht 5\' 10"  (1.778 m)   Wt 77.1  kg   SpO2 100%   BMI 24.39 kg/m   Physical Exam Vitals signs and nursing note reviewed.  Constitutional:      General: He is not in acute distress.    Appearance: He is ill-appearing and diaphoretic.  HENT:     Head: Normocephalic and atraumatic.  Eyes:     Conjunctiva/sclera: Conjunctivae normal.  Cardiovascular:     Rate and Rhythm: Normal rate and regular rhythm.  Pulmonary:     Effort: Pulmonary effort is normal. No respiratory distress.     Breath sounds: No stridor.  Abdominal:     General: There is no distension.  Musculoskeletal:       Feet:  Skin:    General: Skin is warm.  Neurological:     Mental Status: He is alert and oriented to person, place, and time.      ED Treatments / Results  Labs (all labs ordered are listed, but only abnormal results are displayed) Labs Reviewed  POCT I-STAT, CHEM 8 - Abnormal; Notable for the following components:      Result Value   BUN 29 (*)    Creatinine, Ser 1.30 (*)    All other components within normal limits  CBC WITH DIFFERENTIAL/PLATELET  PROTIME-INR  APTT  COMPREHENSIVE METABOLIC PANEL  LIPID PANEL  I-STAT TROPONIN, ED  POCT I-STAT TROPONIN I    EKG EKG Interpretation  Date/Time:  Sunday July 12 2018 17:51:55  EST Ventricular Rate:  54 PR Interval:    QRS Duration: 86 QT Interval:  437 QTC Calculation: 415 R Axis:   147 Text Interpretation:  Right and left arm electrode reversal, interpretation assumes no reversal Sinus or ectopic atrial rhythm Inferior infarct, acute (LCx) Anterior infarct, old Lateral leads are also involved >>> Acute MI <<< Abnormal ekg Confirmed by Carmin Muskrat [phone]) on 07/12/2018 6:09:08 PM  Procedures Procedures (including critical care time)  Medications Ordered in ED Medications  0.9 %  sodium chloride infusion (has no administration in time range)  Heparin (Porcine) in NaCl 1000-0.9 UT/500ML-% SOLN (500 mLs  Given 07/12/18 1810)  Heparin (Porcine) in NaCl 1000-0.9 UT/500ML-% SOLN (500 mLs  Given 07/12/18 1810)  heparin injection 4,000 Units (4,000 Units Intravenous Given 07/12/18 1751)  morphine 4 MG/ML injection (4 mg  Given 07/12/18 1755)     Initial Impression / Assessment and Plan / ED Course  I have reviewed the triage vital signs and the nursing notes.  Pertinent labs & imaging results that were available during my care of the patient were reviewed by me and considered in my medical decision making (see chart for details).  On arrival I discussed patient's case with EMS providers, and the patient himself. With concern for ST elevation MI, the patient had heparin, morphine provided. Patient received continuous cardiac monitoring, and then I discussed the patient's case with our cardiology colleagues.  Update:, Patient transferred to catheterization lab.  This elderly male with no history of cardiac disease presents with new chest pain, EKG with substantial ST elevations in the inferior, lateral leads, concern for acute occlusion. After providing heparin, morphine in the emergency department, discussing the patient's presentation with our cardiology colleagues, is transferred expeditiously to the catheterization lab.   Update: Chart review after  catheterization notable with new total occlusion of the circumflex artery, Cath Lab notes below: Acute inferior ST segment elevation myocardial infarction secondary to subtotal occlusion of a codominant mid left circumflex coronary artery.  Normal LAD and RCA.  LVEDP 20 mmHg.  Successful PCI to the mid left circumflex coronary artery with ultimate insertion of a 2.25 x 24 mm Synergy DES stent postdilated to 2.4 mm with the stenosis being reduced to 0%. Final Clinical Impressions(s) / ED Diagnoses   Final diagnoses:  Acute ST elevation myocardial infarction (STEMI), unspecified artery (HCC)  ST elevation myocardial infarction involving left circumflex coronary artery ([place])   CRITICAL CARE Performed by: Carmin Muskrat Total critical care time: 35 minutes Critical care time was exclusive of separately billable procedures and treating other patients. Critical care was necessary to treat or prevent imminent or life-threatening deterioration. Critical care was time spent personally by me on the following activities: development of treatment plan with patient and/or surrogate as well as nursing, discussions with consultants, evaluation of patient's response to treatment, examination of patient, obtaining history from patient or surrogate, ordering and performing treatments and interventions, ordering and review of laboratory studies, ordering and review of radiographic studies, pulse oximetry and re-evaluation of patient's condition.    Carmin Muskrat, MD 07/12/18 Mallie Mussel    Carmin Muskrat, MD 07/12/18 [phone]

## 2018-07-12 NOTE — ED Triage Notes (Signed)
Pt had removal of toes X 3 on the 8th. He had new onset of chest pressure to mid chest radiating to the left. Pain is currently 8/10. Pt was diaphoretic and short of breath. Pain started around 1650. Pt took nitro prior to EMS arrrival. Pt was bradycardic with EMS. CBg 77. 18G PIV to LAC 18G PIV to RAC.

## 2018-07-12 NOTE — H&P (Signed)
History and Physical   Patient ID: Adam Ray, MRN: [id number], DOB: [birth year]   Date of Encounter: 07/12/2018, 6:10 PM  Primary Care Provider: [name], MD Cardiologist: NA  Chief Complaint:  CP  History of Present Illness: Adam Ray is a 63 y.o. male w/ no prior cardiac hx but complicated PMHx as below, just post-op from transmetatarsal amputation of R foot on 07-08-18, brought by EMS to ED c/o CP, w/ STEMI activated in the field by EMS. Pt states he had sudden onset of midsternal chest tightness/discomfort that he describes as a "hard pain" tonight at rest. It radiated down into his L arm and was associated with diaphoresis and SOB. No nausea, palpitations, or other sx. He had never had any similar sx before; the pain persisted about 20-30 min and he told his wife about his sx. They called EMS; EMS found 74mm ST elevation in inferior leads on initial EKG and STEMI activated. Pt initially hypotensive upon EMS arrival w/ SBP in the 90s; given inferior ST changes and hypotension, NTG was not given. Upon my interview of the pt he was still having 8/10 CP with persistent EKG changes as above, HR in the high 40s.  Past Medical History:  Diagnosis Date  . Allergy   . Arthritis   . Bronchitis   . Cataract   . Chronic kidney disease   . Claudication [facility])    right foot ray resection  . Colon polyps    hyperplastic  . COPD (chronic obstructive pulmonary disease) ([place])   . Diabetes mellitus   . Genital warts   . Gout   . Hyperlipidemia   . Hypertension   . Pneumonia     Past Surgical History:  Procedure Laterality Date  . AMPUTATION Right 01/31/2016   Procedure: Right 2nd Toe Amputation;  Surgeon: Newt Minion, MD;  Location: [facility];  Service: Orthopedics;  Laterality: Right;  . AMPUTATION Right 07/08/2018   Procedure: RIGHT TRANSMETATARSAL AMPUTATION;  Surgeon: Newt Minion, MD;  Location: [facility];  Service: Orthopedics;  Laterality: Right;  . CATARACT EXTRACTION     right  eye  . COLONOSCOPY    . I&D EXTREMITY  04/11/2012   Procedure: IRRIGATION AND DEBRIDEMENT EXTREMITY;  Surgeon: Wylene Simmer, MD;  Location: [facility];  Service: Orthopedics;  Laterality: Right;  . Surgery left great toe    . Tear ducts bilateral eyes    . TRANSMETATARSAL AMPUTATION Right 07/08/2018     Prior to Admission medications   Medication Sig Start Date End Date Taking? Authorizing Provider  acetaminophen (TYLENOL) 500 MG tablet Take 500-1,500 mg by mouth every 6 (six) hours as needed (for pain.).    [provider]  aspirin EC 81 MG tablet Take 81 mg by mouth daily.    [provider]  insulin aspart (NOVOLOG) 100 UNIT/ML injection Inject 40 Units into the skin 3 (three) times daily before meals.    [provider]  Insulin Glargine (LANTUS SOLOSTAR) 100 UNIT/ML Solostar Pen Inject 40 Units into the skin 2 (two) times daily.    [provider]  metFORMIN (GLUCOPHAGE) 500 MG tablet Take 1,000 mg by mouth 2 (two) times daily.    [provider]  Multiple Vitamin (MULTIVITAMIN WITH MINERALS) TABS tablet Take 2 tablets by mouth 2 (two) times daily. COMPLETE MULTIVITAMIN    [provider]  oxyCODONE-acetaminophen (PERCOCET/ROXICET) 5-325 MG tablet Take 1 tablet by mouth every 4 (four) hours as needed. 07/09/18  Newt Minion, MD  levofloxacin (LEVAQUIN) 750 MG tablet Take 1 tablet (750 mg total) by mouth daily. For 14 days 07/07/12 07/24/12  Verdie Drown, Samuel Germany, MD     Allergies: No Known Allergies  Social History:  The patient  reports that he has been smoking cigars and cigarettes. He started smoking about 55 years ago. He has a 14.40 pack-year smoking history. He has quit using smokeless tobacco. He reports current alcohol use. He reports that he does not use drugs.   Family History:  The patient's family history includes Diabetes in his mother; Heart failure in his father; Stroke in his mother.   ROS:  Please see the history of present  illness.     All other systems reviewed and negative.   Vital Signs: Blood pressure 136/74, pulse (!) 51, resp. rate 12, height 5\' 10"  (1.778 m), weight 77.1 kg, SpO2 100 %.  PHYSICAL EXAM: General:  Well nourished, well developed, in mild distress HEENT: normal Lymph: no adenopathy Neck: no JVD Endocrine:  No thryomegaly Vascular: No carotid bruits; DP pulses not palpable; RLE wrapped Cardiac:  normal S1, S2; RRR; no murmur  Lungs:  clear to auscultation bilaterally, no wheezing, rhonchi or rales  Abd: soft, nontender, no hepatomegaly  Ext: RLE wrapped post-op from transmetatarsal amputation of R foot; tr LLE edema Musculoskeletal:  No deformities, BUE and BLE strength normal and equal Skin: warm and dry  Neuro:  CNs 2-12 intact, no focal abnormalities noted Psych:  Normal affect   EKG:  SB with 1mm ST elevation in the inferior leads, new compared to 07-08-18  Labs:   Lab Results  Component Value Date   WBC 5.0 06/25/2018   HGB 13.9 07/12/2018   HCT 41.0 07/12/2018   MCV 90.4 06/25/2018   PLT 171 06/25/2018   Recent Labs  Lab 07/12/18 1801  NA 140  K 4.3  CL 102  BUN 29*  CREATININE 1.30*  GLUCOSE 72   No results for input(s): CKTOTAL, CKMB, TROPONINI in the last 72 hours. Troponin (Point of Care Test) Recent Labs    07/12/18 1755  TROPIPOC 0.01    Lab Results  Component Value Date   CHOL 159 10/21/2017   HDL 50 10/21/2017   LDLCALC 65 10/21/2017   TRIG 219 (H) 10/21/2017   No results found for: DDIMER  Radiology/Studies:  Dg Chest 2 View  Result Date: 06/20/2018 CLINICAL DATA:  Cough EXAM: CHEST - 2 VIEW COMPARISON:  11/17/2017 FINDINGS: The heart size and mediastinal contours are within normal limits. Both lungs are clear. Degenerative changes of the spine. IMPRESSION: No active cardiopulmonary disease. Electronically Signed   By: Donavan Foil M.D.   On: 06/20/2018 16:50   Vas [place] Lower Extremity Venous (dvt) (only Mc & Wl 7a-7p)  Result Date:  06/26/2018  Lower Venous Study Indications: Swelling.  Limitations: Poor ultrasound/tissue interface. Performing Technologist: Oliver Hum RVT  Examination Guidelines: A complete evaluation includes B-mode imaging, spectral Doppler, color Doppler, and power Doppler as needed of all accessible portions of each vessel. Bilateral testing is considered an integral part of a complete examination. Limited examinations for reoccurring indications may be performed as noted.  Right Venous Findings: +---------+---------------+---------+-----------+----------+-------+          CompressibilityPhasicitySpontaneityPropertiesSummary +---------+---------------+---------+-----------+----------+-------+ CFV      Full           Yes      Yes                          +---------+---------------+---------+-----------+----------+-------+  SFJ      Full                                                 +---------+---------------+---------+-----------+----------+-------+ FV Prox  Full                                                 +---------+---------------+---------+-----------+----------+-------+ FV Mid   Full                                                 +---------+---------------+---------+-----------+----------+-------+ FV DistalFull                                                 +---------+---------------+---------+-----------+----------+-------+ PFV      Full                                                 +---------+---------------+---------+-----------+----------+-------+ POP      Full           Yes      Yes                          +---------+---------------+---------+-----------+----------+-------+ PTV      Full                                                 +---------+---------------+---------+-----------+----------+-------+ PERO     Full                                                 +---------+---------------+---------+-----------+----------+-------+   Left Venous Findings: +---+---------------+---------+-----------+----------+-------+    CompressibilityPhasicitySpontaneityPropertiesSummary +---+---------------+---------+-----------+----------+-------+ CFVFull           Yes      Yes                          +---+---------------+---------+-----------+----------+-------+    Summary: Right: There is no evidence of deep vein thrombosis in the lower extremity. However, portions of this examination were limited- see technologist comments above. No cystic structure found in the popliteal fossa. Left: No evidence of common femoral vein obstruction.  *See table(s) above for measurements and observations. Electronically signed by Curt Jews MD on 06/26/2018 at 2:29:33 PM.    Final       ASSESSMENT AND PLAN:   1. STEMI: urgent LHC. Avoid NTG given hypotension. Will initiate the standard medical tx post-procedure of daily ASA, DAPT (if PCI is performed), statin, BB, ACEi if tolerated  2. HTN/dyslipidemia/DM2: start prior home regimen and  adjust as necessary. Will check FLP, A1c  3. S/p R foot amputation: stable. May need WOCN consult  Thank you for the opportunity to participate in the care of this patient. Will follow. Please call w/ questions.   Signed,  Rudean Curt, MD, [facility] 07/12/2018 6:10 PM

## 2018-07-13 ENCOUNTER — Encounter (HOSPITAL_COMMUNITY): Payer: Self-pay | Admitting: Cardiovascular Disease

## 2018-07-13 ENCOUNTER — Telehealth: Payer: Self-pay | Admitting: Family Medicine

## 2018-07-13 ENCOUNTER — Other Ambulatory Visit (HOSPITAL_COMMUNITY): Payer: Non-veteran care

## 2018-07-13 ENCOUNTER — Telehealth (INDEPENDENT_AMBULATORY_CARE_PROVIDER_SITE_OTHER): Payer: Self-pay | Admitting: Orthopedic Surgery

## 2018-07-13 ENCOUNTER — Inpatient Hospital Stay (HOSPITAL_COMMUNITY): Payer: Medicare HMO

## 2018-07-13 ENCOUNTER — Other Ambulatory Visit: Payer: Self-pay

## 2018-07-13 DIAGNOSIS — I2121 ST elevation (STEMI) myocardial infarction involving left circumflex coronary artery: Principal | ICD-10-CM

## 2018-07-13 DIAGNOSIS — I340 Nonrheumatic mitral (valve) insufficiency: Secondary | ICD-10-CM

## 2018-07-13 LAB — CBC
HCT: 38.1 % — ABNORMAL LOW (ref 39.0–52.0)
Hemoglobin: 12.6 g/dL — ABNORMAL LOW (ref 13.0–17.0)
MCH: 29.7 pg (ref 26.0–34.0)
MCHC: 33.1 g/dL (ref 30.0–36.0)
MCV: 89.9 fL (ref 80.0–100.0)
NRBC: 0 % (ref 0.0–0.2)
Platelets: 131 10*3/uL — ABNORMAL LOW (ref 150–400)
RBC: 4.24 MIL/uL (ref 4.22–5.81)
RDW: 13.5 % (ref 11.5–15.5)
WBC: 4.3 10*3/uL (ref 4.0–10.5)

## 2018-07-13 LAB — LIPID PANEL
Cholesterol: 123 mg/dL (ref 0–200)
HDL: 47 mg/dL (ref >40)
LDL Cholesterol: 29 mg/dL (ref 0–99)
Total CHOL/HDL Ratio: 2.6 RATIO
Triglycerides: 235 mg/dL — ABNORMAL HIGH (ref <150)
VLDL: 47 mg/dL — ABNORMAL HIGH (ref 0–40)

## 2018-07-13 LAB — HIV ANTIBODY (ROUTINE TESTING W REFLEX): HIV SCREEN 4TH GENERATION: NONREACTIVE

## 2018-07-13 LAB — BASIC METABOLIC PANEL
ANION GAP: 5 (ref 5–15)
BUN: 18 mg/dL (ref 8–23)
CO2: 25 mmol/L (ref 22–32)
Calcium: 8.4 mg/dL — ABNORMAL LOW (ref 8.9–10.3)
Chloride: 108 mmol/L (ref 98–111)
Creatinine, Ser: 1.07 mg/dL (ref 0.61–1.24)
GFR calc Af Amer: >60 mL/min (ref >60)
GFR calc non Af Amer: >60 mL/min (ref >60)
Glucose, Bld: 78 mg/dL (ref 70–99)
Potassium: 4 mmol/L (ref 3.5–5.1)
SODIUM: 138 mmol/L (ref 135–145)

## 2018-07-13 LAB — ECHOCARDIOGRAM COMPLETE
Height: 70 in
Weight: 2720 oz

## 2018-07-13 LAB — POCT ACTIVATED CLOTTING TIME
Activated Clotting Time: 406 seconds
Activated Clotting Time: >1000 seconds

## 2018-07-13 LAB — GLUCOSE, CAPILLARY
Glucose-Capillary: 70 mg/dL (ref 70–99)
Glucose-Capillary: 106 mg/dL — ABNORMAL HIGH (ref 70–99)
Glucose-Capillary: 106 mg/dL — ABNORMAL HIGH (ref 70–99)
Glucose-Capillary: 105 mg/dL — ABNORMAL HIGH (ref 70–99)
Glucose-Capillary: 157 mg/dL — ABNORMAL HIGH (ref 70–99)
Glucose-Capillary: 96 mg/dL (ref 70–99)

## 2018-07-13 LAB — TROPONIN I
Troponin I: 1.45 ng/mL (ref <0.03)
Troponin I: 1.39 ng/mL (ref <0.03)

## 2018-07-13 LAB — PATHOLOGIST SMEAR REVIEW

## 2018-07-13 MED ORDER — IOHEXOL 350 MG/ML SOLN
INTRAVENOUS | Status: DC | PRN
  Administered 2018-07-12: 165 mL via INTRA_ARTERIAL

## 2018-07-13 MED ORDER — DEXTROSE 50 % IV SOLN: 1.0000 | INTRAVENOUS | Status: DC | PRN

## 2018-07-13 MED ORDER — INSULIN ASPART 100 UNIT/ML ~~LOC~~ SOLN
10.0000 [IU] | Freq: Once | SUBCUTANEOUS | Status: AC
  Administered 2018-07-13: 10 [IU] via SUBCUTANEOUS

## 2018-07-13 NOTE — Progress Notes (Signed)
  Echocardiogram 2D Echocardiogram has been performed.  Adam Ray 07/13/2018, 11:53 AM

## 2018-07-13 NOTE — Care Management Note (Addendum)
Case Management Note  Patient Details  Name: Adam Ray MRN: [id number] Date of Birth: 1956-05-20  Subjective/Objective:  63 yo male presented with CP s/p cath with PCI; PMH: post-op TMA of Right foot with prevena vac intact.    Action/Plan: CM met with patient/spouse to discuss transitional needs. Patient states living home with spouse with assistance with ADLs provided by spouse; ambulates with a RW. PCP verified as: Dr. Chrisandra Netters; pharmacy of choice: [facility] with patient followed by Dr. Hortencia Conradi [facility] Team, [place]). Patient indicated he alerted [name] of his admission to [name]. [employer] 30-day free card provided, with patient agreeable to [facility] for his Rx needs prior to transitioning home. CM team will continue to follow.   Expected Discharge Date:  07/16/18               Expected Discharge Plan:  Home/Self Care  In-House Referral:  NA  Discharge planning Services  CM Consult, Medication Assistance([facility] card provided)  Post Acute Care Choice:  NA Choice offered to:  NA  DME Arranged:  N/A DME Agency:  NA  HH Arranged:  NA HH Agency:  NA  Status of Service:  In process, will continue to follow  If discussed at Long Length of Stay Meetings, dates discussed:    Additional Comments: 07/15/18 @ 1231-Breniya Goertzen RNCM-CM faxed today's progress notes and DC summary to [facility] as requested via Epic to follow for Rx needs.   Midge Minium RN, BSN, NCM-BC, ACM-RN [phone] 07/13/2018, 3:42 PM

## 2018-07-13 NOTE — Telephone Encounter (Signed)
See below

## 2018-07-13 NOTE — Telephone Encounter (Signed)
Patient called to let Dr Sharol Given know he is in the hospital. Patient said he was admitted Sunday night. Patient said he is in ICU. The number to contact patient is [phone]

## 2018-07-13 NOTE — Telephone Encounter (Signed)
Pt is calling to inform Dr. [name] that he in ICU at [facility] due to having a heart attack last night 07/12/18.

## 2018-07-13 NOTE — Progress Notes (Signed)
Progress Note  Patient Name: Adam Ray Date of Encounter: 07/13/2018  Primary Cardiologist: Dr. Shelva Majestic  Subjective   Postop day 1 lateral STEMI from subtotally occluded circumflex.  He denies chest pain or shortness of breath.  Inpatient Medications    Scheduled Meds: . aspirin EC  81 mg Oral Daily  . atorvastatin  80 mg Oral q1800  . insulin aspart  0-15 Units Subcutaneous TID WC  . insulin aspart  0-5 Units Subcutaneous QHS  . insulin aspart  40 Units Subcutaneous TID AC  . insulin glargine  40 Units Subcutaneous BID  . multivitamin with minerals  2 tablet Oral BID  . sodium chloride flush  3 mL Intravenous Q12H  . ticagrelor  90 mg Oral BID   Continuous Infusions: . sodium chloride    . sodium chloride 80 mL/hr at 07/13/18 0500  . sodium chloride     PRN Meds: sodium chloride, acetaminophen, diazepam, nitroGLYCERIN, ondansetron (ZOFRAN) IV, oxyCODONE-acetaminophen, sodium chloride flush   Vital Signs    Vitals:   07/13/18 0615 07/13/18 0630 07/13/18 0648 07/13/18 0807  BP: 94/65 113/63 126/66   Pulse: (!) 50 (!) 58 (!) 56   Resp: _0 Temp:    98.2 F (36.8 C)  TempSrc:    Oral  SpO2: 99% 99% 98%   Weight:      Height:        Intake/Output Summary (Last 24 hours) at 07/13/2018 0859 Last data filed at 07/13/2018 0500 Gross per 24 hour  Intake 1145.16 ml  Output 750 ml  Net 395.16 ml   Last 3 Weights 07/12/2018 07/08/2018 06/29/2018  Weight (lbs) 170 lb 170 lb 170 lb  Weight (kg) 77.111 kg 77.111 kg 77.111 kg      Telemetry    Normal sinus rhythm- Personally Reviewed  ECG    Sinus bradycardia 57, septal Q waves, inferior T wave inversion.- Personally Reviewed  Physical Exam   GEN: No acute distress.   Neck: No JVD Cardiac: RRR, no murmurs, rubs, or gallops.  Respiratory: Clear to auscultation bilaterally. GI: Soft, nontender, non-distended  MS: No edema; No deformity. Neuro:  Nonfocal  Psych: Normal affect  Extremities: Right  radial puncture site appears stable  Labs    Chemistry Recent Labs  Lab 07/12/18 1801 07/12/18 1805 07/13/18 0621  NA 140 140 138  K 4.3 4.4 4.0  CL 102 105 108  CO2  --  28 25  GLUCOSE 72 75 78  BUN 29* 22 18  CREATININE 1.30* 1.24 1.07  CALCIUM  --  9.0 8.4*  PROT  --  6.0*  --   ALBUMIN  --  3.5  --   AST  --  27  --   ALT  --  24  --   ALKPHOS  --  73  --   BILITOT  --  1.1  --   GFRNONAA  --  >60 >60  GFRAA  --  >60 >60  ANIONGAP  --  7 5     Hematology Recent Labs  Lab 07/12/18 1801 07/12/18 1805 07/13/18 0621  WBC  --  4.9 4.3  RBC  --  4.48 4.24  HGB 13.9 13.2 12.6*  HCT 41.0 40.9 38.1*  MCV  --  91.3 89.9  MCH  --  29.5 29.7  MCHC  --  32.3 33.1  RDW  --  13.4 13.5  PLT  --  129* 131*    Cardiac Enzymes  Recent Labs  Lab 07/12/18 2004 07/13/18 0057 07/13/18 0621  TROPONINI 0.12* 1.45* 1.39*    Recent Labs  Lab 07/12/18 1755  TROPIPOC 0.01     BNPNo results for input(s): BNP, PROBNP in the last 168 hours.   DDimer No results for input(s): DDIMER in the last 168 hours.   Radiology    No results found.  Cardiac Studies   Cardiac catheterization (07/12/2018)  Conclusion     Prox Cx to Mid Cx lesion is 95% stenosed.  Post intervention, there is a 0% residual stenosis.  A stent was successfully placed.   Acute inferior ST segment elevation myocardial infarction secondary to subtotal occlusion of a codominant mid left circumflex coronary artery.  Normal LAD and RCA.  LVEDP 20 mmHg.  Successful PCI to the mid left circumflex coronary artery with ultimate insertion of a 2.25 x 24 mm Synergy DES stent postdilated to 2.4 mm with the stenosis being reduced to 0%.  RECOMMENDATION: DAPT therapy for a minimum of 12 months of aspirin/Brilinta.  Optimal blood pressure control.  Aggressive lipid intervention with target LDL less than 70.     Patient Profile     63 y.o. male with history of hypertension, hyperlipidemia, tobacco  abuse with COPD and peripheral vascular disease who presented with complaints of chest pain and STEMI.  He recently had a trans-met on 07/08/2018 and has a wound VAC on.  He had a stent placed in his AV groove circumflex by Dr. Claiborne Billings performed radially.  He had no other significant CAD.  Troponins were low.  2D echo is pending.  Assessment & Plan    1: Coronary artery disease- postop day 1 STEMI treated with PCI and drug-eluting stenting of the mid AV groove circumflex with a synergy drug-eluting stent.  He had no other significant CAD.  His troponins were low.  His EKG today shows no acute changes with septal Q waves and inferior T wave inversion.  He is on dual antiplatelet therapy with aspirin and Brilinta.  Not on beta-blocker because of relative bradycardia.  2: Hyperlipidemia- admission LDL was 29.  He is on high-dose atorvastatin  3: Tobacco abuse- counseled about the importance of tobacco cessation  4: Peripheral vascular disease- postop day 7 transmetatarsal amputation with a wound VAC currently on.  We have consulted the wound care nurse.  We will keep in the unit today.  Hopefully the wound care nurse will evaluate and treat appropriately.  Continue current medications.  Anticipate transfer to telemetry tomorrow.  Ambulate with cardiac rehab.  Hopefully home on Wednesday.  For questions or updates, please contact [place] Please consult [email or web address] for contact info under        Signed, Quay Burow, MD  07/13/2018, 8:59 AM

## 2018-07-13 NOTE — Consult Note (Addendum)
[place] Nurse wound consult note Reason for Consult: Consult requested for right foot.  Pt had surgery for toe amputations on 1/8 by Dr Sharol Given of the ortho service, according to the progress notes.  A Prevena negative pressure machine and dressing was applied and the routine is to leave them in place for 7 days, then remove and apply a dressing.  The patient has a Prevena drape and dressing and tubing in place, but states he left his machine at home. The [facility] team happened to have a spare machine in our closet of free samples; applied to 115mm cont suction and there is a good seal, small amt dark brown old bloody drainage in the suction cannister when attached to the tubing.  Pt can use this machine while in the hospital and after discharge and states he will follow up with Dr Sharol Given for his followup post-op appointment.  Discussed that the battery life is expected to last 7 days, then the dressing and machine should be removed and Xerform and gauze can be applied; he verbalized understanding.    Wound type: Full thickness post-op wound not visualized, since dressing remained in place. Kerlex applied over Prevena drape. Please re-consult if further assistance is needed.  Thank-you,  Julien Girt MSN, [place], [place], [place], [place]

## 2018-07-13 NOTE — Progress Notes (Signed)
Inpatient Diabetes Program Recommendations  AACE/ADA: New Consensus Statement on Inpatient Glycemic Control (2015)  Target Ranges:  Prepandial:   less than 140 mg/dL      Peak postprandial:   less than 180 mg/dL (1-2 hours)      Critically ill patients:  140 - 180 mg/dL   Lab Results  Component Value Date   GLUCAP 106 (H) 07/13/2018   HGBA1C 7.0 (H) 07/12/2018    Review of Glycemic ControlResults for RAMY, GRETH (MRN [id number]) as of 07/13/2018 10:33  Ref. Range 07/12/2018 21:23 07/12/2018 21:53 07/13/2018 06:38 07/13/2018 08:48  Glucose-Capillary Latest Ref Range: 70 - 99 mg/dL 57 (L) 80 70 106 (H)    Diabetes history: DM 2 Outpatient Diabetes medications:  Lantus 40 units bid, Metformin 1000 mg bid Current orders for Inpatient glycemic control:  Novolog moderate tid with meals and HS, Lantus 40 units bid  Inpatient Diabetes Program Recommendations:    Please consider reducing Lantus to 30 units bid due to blood sugars <110 mg/dL.    Thanks,  Adah Perl, RN, BC-ADM Inpatient Diabetes Coordinator Pager [phone] (8a-5p)

## 2018-07-13 NOTE — Progress Notes (Signed)
CARDIAC REHAB PHASE I   PRE:  Rate/Rhythm: 57 SB  BP:  Sitting: 102/52      SaO2: 99 RA  MODE:  Ambulation: 250 ft   POST:  Rate/Rhythm: 78 SR  BP:  Sitting: 134/74    SaO2: 99 RA   Pt ambulated 255ft in hallway assist of 2 with gait belt and rolling walker. Pt denies CP, does c/o some SOB and dizziness. Pt and wife educated on importance of ASA, Brilinta, and NTG. Pt given MI book, stent card, heart healthy and diabetic diets. Reviewed restrictions and exercise guidelines. Will refer to CRP II [place].  [id number] Rufina Falco, RN BSN 07/13/2018 10:16 AM

## 2018-07-13 NOTE — Progress Notes (Signed)
PCP Social Note  I am Adam Ray's PCP at the [facility]. I stopped by his ICU room to visit. Offered supportive listening ear as he recalled the events of yesterday. He was tearful and grateful for the care he has received. Wife [name], who is also my patient, was at bedside and attentive to his needs.  We discussed his tobacco use. He is motivated to quit smoking. He started smoking at the age of 62. Recently cut bac k from 1/2ppd to 1/3ppd. He is interested in nicotine patches at discharge to help him stay away from cigarettes.   I appreciate the excellent care of the cardiology team in saving [name]'s life yesterday. I will see him in follow up at the [facility] after discharge. Please contact me if I can be helpful in his care.   Chrisandra Netters, MD [place] Pager [phone]

## 2018-07-13 NOTE — Telephone Encounter (Signed)
Noted. I will plan to go see Adam Ray in the ICU later this morning.  [name], MD

## 2018-07-14 ENCOUNTER — Encounter (HOSPITAL_COMMUNITY): Payer: Self-pay

## 2018-07-14 LAB — BASIC METABOLIC PANEL
Anion gap: 7 (ref 5–15)
BUN: 20 mg/dL (ref 8–23)
CO2: 26 mmol/L (ref 22–32)
Calcium: 8.9 mg/dL (ref 8.9–10.3)
Chloride: 105 mmol/L (ref 98–111)
Creatinine, Ser: 1.24 mg/dL (ref 0.61–1.24)
GFR calc Af Amer: >60 mL/min (ref >60)
GFR calc non Af Amer: >60 mL/min (ref >60)
Glucose, Bld: 96 mg/dL (ref 70–99)
POTASSIUM: 4 mmol/L (ref 3.5–5.1)
Sodium: 138 mmol/L (ref 135–145)

## 2018-07-14 LAB — GLUCOSE, CAPILLARY
Glucose-Capillary: 89 mg/dL (ref 70–99)
Glucose-Capillary: 184 mg/dL — ABNORMAL HIGH (ref 70–99)
Glucose-Capillary: 117 mg/dL — ABNORMAL HIGH (ref 70–99)
Glucose-Capillary: 190 mg/dL — ABNORMAL HIGH (ref 70–99)

## 2018-07-14 MED ORDER — METOPROLOL TARTRATE 12.5 MG HALF TABLET
12.5000 mg | ORAL_TABLET | Freq: Two times a day (BID) | ORAL | Status: DC
  Administered 2018-07-14 – 2018-07-15 (×3): 12.5 mg via ORAL
  Filled 2018-07-14 (×3): qty 1

## 2018-07-14 NOTE — Progress Notes (Signed)
Progress Note  Patient Name: Adam Ray Date of Encounter: 07/14/2018  Primary Cardiologist: Dr. Shelva Majestic  Subjective   Postop day 2 lateral STEMI from subtotally occluded circumflex.  He denies chest pain or shortness of breath.  Inpatient Medications    Scheduled Meds: . aspirin EC  81 mg Oral Daily  . atorvastatin  80 mg Oral q1800  . insulin aspart  0-15 Units Subcutaneous TID WC  . insulin glargine  40 Units Subcutaneous BID  . multivitamin with minerals  2 tablet Oral BID  . sodium chloride flush  3 mL Intravenous Q12H  . ticagrelor  90 mg Oral BID   Continuous Infusions: . sodium chloride    . sodium chloride 80 mL/hr at 07/13/18 0500  . sodium chloride     PRN Meds: sodium chloride, acetaminophen, dextrose, diazepam, nitroGLYCERIN, ondansetron (ZOFRAN) IV, oxyCODONE-acetaminophen, sodium chloride flush   Vital Signs    Vitals:   07/14/18 0400 07/14/18 0500 07/14/18 0600 07/14/18 0700  BP: 110/64 116/63 134/85 118/66  Pulse: 62 60  65  Resp: (!) 22 19    Temp: 98.3 F (36.8 C)   98.4 F (36.9 C)  TempSrc: Oral   Oral  SpO2: 98% 97%  100%  Weight:      Height:        Intake/Output Summary (Last 24 hours) at 07/14/2018 0813 Last data filed at 07/14/2018 0600 Gross per 24 hour  Intake 240 ml  Output 3000 ml  Net -2760 ml   Last 3 Weights 07/12/2018 07/08/2018 06/29/2018  Weight (lbs) 170 lb 170 lb 170 lb  Weight (kg) 77.111 kg 77.111 kg 77.111 kg      Telemetry    Normal sinus rhythm- Personally Reviewed  ECG    Sinus bradycardia 57, septal Q waves, inferior T wave inversion.- Personally Reviewed  Physical Exam   GEN: No acute distress.   Neck: No JVD Cardiac: RRR, no murmurs, rubs, or gallops.  Respiratory: Clear to auscultation bilaterally. GI: Soft, nontender, non-distended  MS: No edema; No deformity. Neuro:  Nonfocal  Psych: Normal affect  Extremities: Right radial puncture site appears stable  Labs    Chemistry Recent Labs   Lab 07/12/18 1805 07/13/18 0621 07/14/18 0307  NA 140 138 138  K 4.4 4.0 4.0  CL 105 108 105  CO2 '28 25 26  '$ GLUCOSE 75 78 96  BUN '22 18 20  '$ CREATININE 1.24 1.07 1.24  CALCIUM 9.0 8.4* 8.9  PROT 6.0*  --   --   ALBUMIN 3.5  --   --   AST 27  --   --   ALT 24  --   --   ALKPHOS 73  --   --   BILITOT 1.1  --   --   GFRNONAA >60 >60 >60  GFRAA >60 >60 >60  ANIONGAP '7 5 7     '$ Hematology Recent Labs  Lab 07/12/18 1801 07/12/18 1805 07/13/18 0621  WBC  --  4.9 4.3  RBC  --  4.48 4.24  HGB 13.9 13.2 12.6*  HCT 41.0 40.9 38.1*  MCV  --  91.3 89.9  MCH  --  29.5 29.7  MCHC  --  32.3 33.1  RDW  --  13.4 13.5  PLT  --  129* 131*    Cardiac Enzymes Recent Labs  Lab 07/12/18 2004 07/13/18 0057 07/13/18 0621  TROPONINI 0.12* 1.45* 1.39*    Recent Labs  Lab 07/12/18 1755  TROPIPOC 0.01  BNPNo results for input(s): BNP, PROBNP in the last 168 hours.   DDimer No results for input(s): DDIMER in the last 168 hours.   Radiology    No results found.  Cardiac Studies   Cardiac catheterization (07/12/2018)  Conclusion     Prox Cx to Mid Cx lesion is 95% stenosed.  Post intervention, there is a 0% residual stenosis.  A stent was successfully placed.   Acute inferior ST segment elevation myocardial infarction secondary to subtotal occlusion of a codominant mid left circumflex coronary artery.  Normal LAD and RCA.  LVEDP 20 mmHg.  Successful PCI to the mid left circumflex coronary artery with ultimate insertion of a 2.25 x 24 mm Synergy DES stent postdilated to 2.4 mm with the stenosis being reduced to 0%.  RECOMMENDATION: DAPT therapy for a minimum of 12 months of aspirin/Brilinta.  Optimal blood pressure control.  Aggressive lipid intervention with target LDL less than 70.   2D echocardiogram (07/13/2018)  Study Conclusions  - Left ventricle: The cavity size was normal. Wall thickness was   normal. Systolic function was vigorous. The  estimated ejection   fraction was in the range of 65% to 70%. Wall motion was normal;   there were no regional wall motion abnormalities. Doppler   parameters are consistent with abnormal left ventricular   relaxation (grade 1 diastolic dysfunction). The E&'e&' ratio is   >15, suggesting elevated LV filling pressure. - Aortic valve: Bicuspid aortic valve with thickened leaflets. No   significant stenosis. There was no regurgitation. - Mitral valve: Mildly thickened leaflets . There was mild   regurgitation. - Left atrium: The atrium was normal in size. - Inferior vena cava: The vessel was dilated. The respirophasic   diameter changes were blunted (< 50%), consistent with elevated   central venous pressure.  Impressions:  - LVEF 65-70%, normal wall thickness, normal wall motion, grade 1   DD, elevated LV filling pressure, bicuspid aortic valve is again   noted (compared to prior echo in 2011) without stenosis or   regurgitation, mild MR, normal LA size, dilated IVC.  Patient Profile     63 y.o. male with history of hypertension, hyperlipidemia, tobacco abuse with COPD and peripheral vascular disease who presented with complaints of chest pain and STEMI.  He recently had a trans-met on 07/08/2018 and has a wound VAC on.  He had a stent placed in his AV groove circumflex by Dr. Claiborne Billings performed radially.  He had no other significant CAD.  Troponins were low.  2D performed yesterday revealed normal LV systolic function.  Assessment & Plan    1: Coronary artery disease- postop day 2 STEMI treated with PCI and drug-eluting stenting of the mid AV groove circumflex with a synergy drug-eluting stent.  He had no other significant CAD.  His troponins were low.  His EKG today shows no acute changes with septal Q waves and inferior T wave inversion.  He is on dual antiplatelet therapy with aspirin and Brilinta.  Start low-dose beta-blocker.  2: Hyperlipidemia- admission LDL was 29.  He is on  high-dose atorvastatin  3: Tobacco abuse- counseled about the importance of tobacco cessation  4: Peripheral vascular disease- postop day 8 transmetatarsal amputation with a wound VAC currently on.  We have consulted the wound care nurse.  Dr. Sharol Given saw him yesterday and has arranged outpatient follow-up next week.  Plan on transferring to telemetry today with intent of discharging home tomorrow.  For questions or updates, please contact [facility] Please consult [email or web address] for contact info under        Signed, Quay Burow, MD  07/14/2018, 8:13 AM

## 2018-07-14 NOTE — Progress Notes (Signed)
Patient ID: Adam Ray, male   DOB: 05/22/1956, 63 y.o.   MRN: [id number] Patient is seen in follow-up status post transmetatarsal amputation.  Patient states that he remove the wound VAC pump stating there was not enough room in the ambulance for transfer to the hospital.  Patient currently has a wound VAC pump in place this is working well minimal drainage, discussed the importance of elevation patient's foot is dependent.  I will follow-up in the office next week to change the wound VAC dressing.  Discussed the importance of plugging in the pump` so it stays charged.

## 2018-07-14 NOTE — Progress Notes (Signed)
CARDIAC REHAB PHASE I   0930: Offered to walk pt earlier in the day, and pt stated he couldn't because he needed to eat first.  1330: Returned to see if pt would like to walk, pt states he already has. Pt denies CP or SOB. Pt and wife deny questions or concerns over education. Pt feels "ready" to go home. Pt requesting to speak with someone about his insurance. Will continue to follow and encourage ambulation.  [id number] Rufina Falco, RN BSN 07/14/2018 1:33 PM

## 2018-07-14 NOTE — Plan of Care (Signed)
  Problem: Education: Goal: Understanding of medication regimen will improve Outcome: Progressing   Problem: Cardiac: Goal: Ability to achieve and maintain adequate cardiopulmonary perfusion will improve Outcome: Progressing   Problem: Health Behavior/Discharge Planning: Goal: Ability to safely manage health-related needs after discharge will improve Outcome: Progressing

## 2018-07-14 NOTE — Progress Notes (Signed)
Pt has questions re: insurance. SW c/s placed

## 2018-07-15 LAB — GLUCOSE, CAPILLARY
Glucose-Capillary: 94 mg/dL (ref 70–99)
Glucose-Capillary: 159 mg/dL — ABNORMAL HIGH (ref 70–99)

## 2018-07-15 MED ORDER — ATORVASTATIN CALCIUM 80 MG PO TABS: 80.0000 mg | ORAL_TABLET | Freq: Every day | ORAL | 11 refills | Status: DC

## 2018-07-15 MED ORDER — NITROGLYCERIN 0.4 MG SL SUBL: 0.4000 mg | SUBLINGUAL_TABLET | SUBLINGUAL | 11 refills | Status: DC | PRN

## 2018-07-15 MED ORDER — METOPROLOL TARTRATE 25 MG PO TABS: 12.5000 mg | ORAL_TABLET | Freq: Two times a day (BID) | ORAL | 11 refills | Status: DC

## 2018-07-15 MED ORDER — TICAGRELOR 90 MG PO TABS: 90.0000 mg | ORAL_TABLET | Freq: Two times a day (BID) | ORAL | 11 refills | Status: DC

## 2018-07-15 MED FILL — ATORVASTATIN CALCIUM 80 MG: 80 | 30 days supply | Qty: 30 | Fill #0

## 2018-07-15 MED FILL — METOPROLOL TARTRATE 25 MG T: 25 | 30 days supply | Qty: 30 | Fill #0

## 2018-07-15 MED FILL — BRILINTA 90 MG TABLET: 90 | 30 days supply | Qty: 60 | Fill #0

## 2018-07-15 MED FILL — NITROGLYCERIN 0.4 MG TAB SL: 0.4 | 8 days supply | Qty: 25 | Fill #0

## 2018-07-15 NOTE — Progress Notes (Signed)
Progress Note  Patient Name: Adam Ray Date of Encounter: 07/15/2018  Primary Cardiologist: Dr. Shelva Majestic  Subjective   Postop day 3 lateral STEMI from subtotally occluded circumflex.  He denies chest pain or shortness of breath.  Inpatient Medications    Scheduled Meds: . aspirin EC  81 mg Oral Daily  . atorvastatin  80 mg Oral q1800  . insulin aspart  0-15 Units Subcutaneous TID WC  . insulin glargine  40 Units Subcutaneous BID  . metoprolol tartrate  12.5 mg Oral BID  . multivitamin with minerals  2 tablet Oral BID  . sodium chloride flush  3 mL Intravenous Q12H  . ticagrelor  90 mg Oral BID   Continuous Infusions: . sodium chloride    . sodium chloride 80 mL/hr at 07/13/18 0500  . sodium chloride     PRN Meds: sodium chloride, acetaminophen, dextrose, diazepam, nitroGLYCERIN, ondansetron (ZOFRAN) IV, oxyCODONE-acetaminophen, sodium chloride flush   Vital Signs    Vitals:   07/15/18 0700 07/15/18 0717 07/15/18 0800 07/15/18 0809  BP:  127/85 117/64   Pulse: (!) 56 61 (!) 59   Resp:      Temp:    98.5 F (36.9 C)  TempSrc:    Oral  SpO2: 99% 100% 100%   Weight:      Height:        Intake/Output Summary (Last 24 hours) at 07/15/2018 0917 Last data filed at 07/15/2018 0800 Gross per 24 hour  Intake 240 ml  Output 600 ml  Net -360 ml   Last 3 Weights 07/12/2018 07/08/2018 06/29/2018  Weight (lbs) 170 lb 170 lb 170 lb  Weight (kg) 77.111 kg 77.111 kg 77.111 kg      Telemetry    Normal sinus rhythm- Personally Reviewed  ECG    Sinus bradycardia 57, septal Q waves, inferior T wave inversion.- Personally Reviewed  Physical Exam   GEN: No acute distress.   Neck: No JVD Cardiac: RRR, no murmurs, rubs, or gallops.  Respiratory: Clear to auscultation bilaterally. GI: Soft, nontender, non-distended  MS: No edema; No deformity. Neuro:  Nonfocal  Psych: Normal affect  Extremities: Right radial puncture site appears stable  Labs     Chemistry Recent Labs  Lab 07/12/18 1805 07/13/18 0621 07/14/18 0307  NA 140 138 138  K 4.4 4.0 4.0  CL 105 108 105  CO2 _0 GLUCOSE 75 78 96  BUN _1 CREATININE 1.24 1.07 1.24  CALCIUM 9.0 8.4* 8.9  PROT 6.0*  --   --   ALBUMIN 3.5  --   --   AST 27  --   --   ALT 24  --   --   ALKPHOS 73  --   --   BILITOT 1.1  --   --   GFRNONAA >60 >60 >60  GFRAA >60 >60 >60  ANIONGAP _2 Hematology Recent Labs  Lab 07/12/18 1801 07/12/18 1805 07/13/18 0621  WBC  --  4.9 4.3  RBC  --  4.48 4.24  HGB 13.9 13.2 12.6*  HCT 41.0 40.9 38.1*  MCV  --  91.3 89.9  MCH  --  29.5 29.7  MCHC  --  32.3 33.1  RDW  --  13.4 13.5  PLT  --  129* 131*    Cardiac Enzymes Recent Labs  Lab 07/12/18 2004 07/13/18 0057 07/13/18 0621  TROPONINI 0.12* 1.45* 1.39*    Recent Labs  Lab 07/12/18 1755  TROPIPOC 0.01     BNPNo results for input(s): BNP, PROBNP in the last 168 hours.   DDimer No results for input(s): DDIMER in the last 168 hours.   Radiology    No results found.  Cardiac Studies   Cardiac catheterization (07/12/2018)  Conclusion     Prox Cx to Mid Cx lesion is 95% stenosed.  Post intervention, there is a 0% residual stenosis.  A stent was successfully placed.   Acute inferior ST segment elevation myocardial infarction secondary to subtotal occlusion of a codominant mid left circumflex coronary artery.  Normal LAD and RCA.  LVEDP 20 mmHg.  Successful PCI to the mid left circumflex coronary artery with ultimate insertion of a 2.25 x 24 mm Synergy DES stent postdilated to 2.4 mm with the stenosis being reduced to 0%.  RECOMMENDATION: DAPT therapy for a minimum of 12 months of aspirin/Brilinta.  Optimal blood pressure control.  Aggressive lipid intervention with target LDL less than 70.   2D echocardiogram (07/13/2018)  Study Conclusions  - Left ventricle: The cavity size was normal. Wall thickness was   normal. Systolic function  was vigorous. The estimated ejection   fraction was in the range of 65% to 70%. Wall motion was normal;   there were no regional wall motion abnormalities. Doppler   parameters are consistent with abnormal left ventricular   relaxation (grade 1 diastolic dysfunction). The E&'e&' ratio is   >15, suggesting elevated LV filling pressure. - Aortic valve: Bicuspid aortic valve with thickened leaflets. No   significant stenosis. There was no regurgitation. - Mitral valve: Mildly thickened leaflets . There was mild   regurgitation. - Left atrium: The atrium was normal in size. - Inferior vena cava: The vessel was dilated. The respirophasic   diameter changes were blunted (< 50%), consistent with elevated   central venous pressure.  Impressions:  - LVEF 65-70%, normal wall thickness, normal wall motion, grade 1   DD, elevated LV filling pressure, bicuspid aortic valve is again   noted (compared to prior echo in 2011) without stenosis or   regurgitation, mild MR, normal LA size, dilated IVC.  Patient Profile     63 y.o. male with history of hypertension, hyperlipidemia, tobacco abuse with COPD and peripheral vascular disease who presented with complaints of chest pain and STEMI.  He recently had a trans-met on 07/08/2018 and has a wound VAC on.  He had a stent placed in his AV groove circumflex by Dr. Claiborne Billings performed radially.  He had no other significant CAD.  Troponins were low.  2D performed yesterday revealed normal LV systolic function.  Assessment & Plan    1: Coronary artery disease- postop day 3 STEMI treated with PCI and drug-eluting stenting of the mid AV groove circumflex with a synergy drug-eluting stent.  He had no other significant CAD.  His troponins were low.  His EKG today shows no acute changes with septal Q waves and inferior T wave inversion.  He is on dual antiplatelet therapy with aspirin and Brilinta.  Start low-dose beta-blocker.  2: Hyperlipidemia- admission LDL was 29.   He is on high-dose atorvastatin  3: Tobacco abuse- counseled about the importance of tobacco cessation  4: Peripheral vascular disease- postop day 8 transmetatarsal amputation with a wound VAC currently on.  We have consulted the wound care nurse.  Dr. Sharol Given saw him yesterday and has arranged outpatient follow-up next week.  Mr. Oran Rein apparently ambulated yesterday.  Is been completely asymptomatic.  Beta-blocker was started yesterday.  He does have a wound VAC on.  He stable for discharge home today and will obtain his outpatient follow-up with the [facility].  We will provide him with 30 days of medication.  He will follow-up with his wound VAC with Dr. Sharol Given as an outpatient..  For questions or updates, please contact [place] Please consult [email or web address] for contact info under        Signed, Quay Burow, MD  07/15/2018, 9:17 AM

## 2018-07-15 NOTE — Discharge Summary (Addendum)
Discharge Summary    Patient ID: Adam Ray MRN: [id number]; DOB: May 09, 1956  Admit date: 07/12/2018 Discharge date: 07/15/2018  Primary Care Provider: [name], MD  Primary Cardiologist: Dr. Shelva Majestic, MD   Discharge Diagnoses    Principal Problem:   STEMI (ST elevation myocardial infarction) [facility]) Active Problems:   Tobacco abuse   Essential hypertension   PAD (peripheral artery disease) (HCC)   COPD, mild (HCC)   Diabetic retinopathy ([place])   Type 2 diabetes mellitus with neurologic complication, with long-term current use of insulin ([place])   Status post transmetatarsal amputation of foot, right ([place])   Acute ST elevation myocardial infarction (STEMI) [facility])   STEMI involving left circumflex coronary artery ([place])  Allergies No Known Allergies  Diagnostic Studies/Procedures    Cardiac catheterization 07/12/2018:   Prox Cx to Mid Cx lesion is 95% stenosed.  Post intervention, there is a 0% residual stenosis.  A stent was successfully placed.   Acute inferior ST segment elevation myocardial infarction secondary to subtotal occlusion of a codominant mid left circumflex coronary artery.  Normal LAD and RCA.  LVEDP 20 mmHg.  Successful PCI to the mid left circumflex coronary artery with ultimate insertion of a 2.25 x 24 mm Synergy DES stent postdilated to 2.4 mm with the stenosis being reduced to 0%.  RECOMMENDATION: DAPT therapy for a minimum of 12 months of aspirin/Brilinta.  Optimal blood pressure control.  Aggressive lipid intervention with target LDL less than 70.   Echocardiogram 07/13/2018:  Study Conclusions  - Left ventricle: The cavity size was normal. Wall thickness was   normal. Systolic function was vigorous. The estimated ejection   fraction was in the range of 65% to 70%. Wall motion was normal;   there were no regional wall motion abnormalities. Doppler   parameters are consistent with abnormal left ventricular   relaxation  (grade 1 diastolic dysfunction). The E&'e&' ratio is   >15, suggesting elevated LV filling pressure. - Aortic valve: Bicuspid aortic valve with thickened leaflets. No   significant stenosis. There was no regurgitation. - Mitral valve: Mildly thickened leaflets . There was mild   regurgitation. - Left atrium: The atrium was normal in size. - Inferior vena cava: The vessel was dilated. The respirophasic   diameter changes were blunted (< 50%), consistent with elevated   central venous pressure.  Impressions:  - LVEF 65-70%, normal wall thickness, normal wall motion, grade 1   DD, elevated LV filling pressure, bicuspid aortic valve is again   noted (compared to prior echo in 2011) without stenosis or   regurgitation, mild MR, normal LA size, dilated IVC.  History of Present Illness    Adam Ray is a 63 y.o. male w/ no prior cardiac hx but complicated PMHx of hypertension, hyperlipidemia, DM2, ongoing tobacco use, COPD, claudication, chronic kidney disease who recently underwent a transmetatarsal amputation of R foot on 07/08/2018, brought by EMS to [facility] with c/o CP found to have a STEMI which was activated in the field by EMS.  On presentation, pt stated he had sudden onset of midsternal chest tightness/discomfort that he described as a "hard pain" on day of presentation which was noted to be at rest.  He also report radiation down to his left arm with associated diaphoresis and shortness of breath.  There was no nausea, palpitations or other symptoms. He reported never having similar symptoms the past. The pain persisted for about 20-30 min. His wife called EMS who found 53mm ST elevation  in inferior leads on initial EKG and STEMI was activated. Pt initially hypotensive upon EMS arrival w/ SBP in the 90s; given inferior ST changes and hypotension, NTG was not given. Upon cardiology interview, he continued having 8/10 CP with persistent EKG changes as above, HR was in the 40s.  Hospital  Course     He was taken to the cardiac cath lab on 07/12/2018 which revealed acute inferior ST segment elevation myocardial infarction secondary to subtotal occlusion of a codominant mid left circumflex coronary artery in which a successful PCI to the mid left circumflex coronary artery with ultimate insertion of a 2.25 x 24 mm Synergy DES stent was placed.  Recommendations are for DAPT therapy with ASA and Brilinta for a minimum of 12 months along with optimal blood pressure control and aggressive lipid intervention with target LDL less than 70.  Echocardiogram revealed an LVEF of 65 to 70% with no wall motion abnormalities and G1 DD.  He had no recurrent symptoms.  Follow-up EKG showed no acute changes with septal Q waves and inferior T wave inversion.  He was started on low-dose beta-blocker. LDL was found to be 29 however pt was started on high intensity statin in the setting of STEMI. He has been counseled on the importance of smoking cessation. Dr. Sharol Given evaluated the patient in the setting of recent transmetatarsal amputation with a wound VAC. OP follow up was set up at that time.    Other hospital problems include:  -Hyperlipidemia- admission LDL was 29.  He is on high-dose atorvastatin  -Tobacco abuse- counseled about the importance of tobacco cessation  -Peripheral vascular disease- postop day 8 transmetatarsal amputation with a wound VAC currently on.  We have consulted the wound care nurse.  Dr. Sharol Given saw him yesterday and has arranged outpatient follow-up next week.  Adam Ray apparently ambulated yesterday. Beta-blocker was started 07/14/2018. He does have a wound VAC on. He will obtain his outpatient follow-up with the [facility]. We will provide him with 30 days of medication.  He will follow-up with his wound VAC with Dr. Sharol Given as an outpatient..  Patient's cath site unremarkable.  He is ambulated with cardiac rehabilitation without complication. Creatinine on 07/14/2018 was  1.24. CBC stable. Troponin down-trending with no recurrent symptoms.    Consultants: None   The patient has been seen and examined by Dr. Alvester Chou who feels that he is stable and ready for discharge today, 07/15/2018. _____________  Discharge Vitals Blood pressure 104/60, pulse (!) 58, temperature 98.6 F (37 C), temperature source Oral, resp. rate 19, height 5\' 10"  (1.778 m), weight 77.1 kg, SpO2 99 %.  Filed Weights   07/12/18 1751  Weight: 77.1 kg   Labs & Radiologic Studies    CBC Recent Labs    07/12/18 1805 07/13/18 0621  WBC 4.9 4.3  NEUTROABS 1.6*  --   HGB 13.2 12.6*  HCT 40.9 38.1*  MCV 91.3 89.9  PLT 129* 433*   Basic Metabolic Panel Recent Labs    07/13/18 0621 07/14/18 0307  NA 138 138  K 4.0 4.0  CL 108 105  CO2 25 26  GLUCOSE 78 96  BUN 18 20  CREATININE 1.07 1.24  CALCIUM 8.4* 8.9   Liver Function Tests Recent Labs    07/12/18 1805  AST 27  ALT 24  ALKPHOS 73  BILITOT 1.1  PROT 6.0*  ALBUMIN 3.5   Cardiac Enzymes Recent Labs    07/12/18 2004 07/13/18 0057 07/13/18 2951  TROPONINI 0.12* 1.45* 1.39*   Hemoglobin A1C Recent Labs    07/12/18 2004  HGBA1C 7.0*   Fasting Lipid Panel Recent Labs    07/13/18 0057  CHOL 123  HDL 47  LDLCALC 29  TRIG 235*  CHOLHDL 2.6   Thyroid Function Tests Recent Labs    07/12/18 2004  TSH 1.131   _____________  Dg Chest 2 View  Result Date: 06/20/2018 CLINICAL DATA:  Cough EXAM: CHEST - 2 VIEW COMPARISON:  11/17/2017 FINDINGS: The heart size and mediastinal contours are within normal limits. Both lungs are clear. Degenerative changes of the spine. IMPRESSION: No active cardiopulmonary disease. Electronically Signed   By: Donavan Foil M.D.   On: 06/20/2018 16:50   Vas [place] Lower Extremity Venous (dvt) (only Mc & Wl 7a-7p)  Result Date: 06/26/2018  Lower Venous Study Indications: Swelling.  Limitations: Poor ultrasound/tissue interface. Performing Technologist: Oliver Hum RVT   Examination Guidelines: A complete evaluation includes B-mode imaging, spectral Doppler, color Doppler, and power Doppler as needed of all accessible portions of each vessel. Bilateral testing is considered an integral part of a complete examination. Limited examinations for reoccurring indications may be performed as noted.  Right Venous Findings: +---------+---------------+---------+-----------+----------+-------+          CompressibilityPhasicitySpontaneityPropertiesSummary +---------+---------------+---------+-----------+----------+-------+ CFV      Full           Yes      Yes                          +---------+---------------+---------+-----------+----------+-------+ SFJ      Full                                                 +---------+---------------+---------+-----------+----------+-------+ FV Prox  Full                                                 +---------+---------------+---------+-----------+----------+-------+ FV Mid   Full                                                 +---------+---------------+---------+-----------+----------+-------+ FV DistalFull                                                 +---------+---------------+---------+-----------+----------+-------+ PFV      Full                                                 +---------+---------------+---------+-----------+----------+-------+ POP      Full           Yes      Yes                          +---------+---------------+---------+-----------+----------+-------+ PTV      Full                                                 +---------+---------------+---------+-----------+----------+-------+  PERO     Full                                                 +---------+---------------+---------+-----------+----------+-------+  Left Venous Findings: +---+---------------+---------+-----------+----------+-------+    CompressibilityPhasicitySpontaneityPropertiesSummary  +---+---------------+---------+-----------+----------+-------+ CFVFull           Yes      Yes                          +---+---------------+---------+-----------+----------+-------+    Summary: Right: There is no evidence of deep vein thrombosis in the lower extremity. However, portions of this examination were limited- see technologist comments above. No cystic structure found in the popliteal fossa. Left: No evidence of common femoral vein obstruction.  *See table(s) above for measurements and observations. Electronically signed by Curt Jews MD on 06/26/2018 at 2:29:33 PM.    Final    Disposition   Pt is being discharged home today in good condition.  Follow-up Plans & Appointments    Follow-up Information    Newt Minion, MD Follow up in 1 week(s).   Specialty:  Orthopedic Surgery Contact information: [place] [address] [phone]          Discharge Instructions    Amb Referral to Cardiac Rehabilitation   Complete by:  As directed    Diagnosis:   Coronary Stents STEMI     Call MD for:  difficulty breathing, headache or visual disturbances   Complete by:  As directed    Call MD for:  extreme fatigue   Complete by:  As directed    Call MD for:  hives   Complete by:  As directed    Call MD for:  persistant dizziness or light-headedness   Complete by:  As directed    Call MD for:  persistant nausea and vomiting   Complete by:  As directed    Call MD for:  redness, tenderness, or signs of infection (pain, swelling, redness, odor or green/yellow discharge around incision site)   Complete by:  As directed    Call MD for:  severe uncontrolled pain   Complete by:  As directed    Call MD for:  temperature >100.4   Complete by:  As directed    Diet - low sodium heart healthy   Complete by:  As directed    Discharge instructions   Complete by:  As directed    No driving for 3 days. No lifting over 5 lbs for 1 week. No sexual activity for 1  week. Keep procedure site clean & dry. If you notice increased pain, swelling, bleeding or pus, call/return!  You may shower, but no soaking baths/hot tubs/pools for 1 week.   PLEASE DO NOT MISS ANY DOSES OF YOUR BRILINTA!!!!! Also keep a log of you blood pressures and bring back to your follow up appt. Please call the office with any questions.   Patients taking blood thinners should generally stay away from medicines like ibuprofen, Advil, Motrin, naproxen, and Aleve due to risk of stomach bleeding. You may take Tylenol as directed or talk to your primary doctor about alternatives.  Some studies suggest Prilosec/Omeprazole interacts with Plavix.  If you have reflux symptoms, please use Protonix for less chance of interaction.   If you notice any bleeding such as blood  in stool, black tarry stools, blood in urine, nosebleeds or any other unusual bleeding, call your doctor immediately. It is not normal to have this kind of bleeding while on a blood thinner and usually indicates there is an underlying problem with one of your body systems that needs to be checked out.   Increase activity slowly   Complete by:  As directed       Discharge Medications   Allergies as of 07/15/2018   No Known Allergies     Medication List    TAKE these medications   acetaminophen 500 MG tablet Commonly known as:  TYLENOL Take 500-1,500 mg by mouth every 6 (six) hours as needed (for pain.).   aspirin EC 81 MG tablet Take 81 mg by mouth daily.   atorvastatin 80 MG tablet Commonly known as:  LIPITOR Take 1 tablet (80 mg total) by mouth daily at 6 PM.   insulin aspart 100 UNIT/ML injection Commonly known as:  novoLOG Inject 40 Units into the skin 3 (three) times daily before meals.   LANTUS SOLOSTAR 100 UNIT/ML Solostar Pen Generic drug:  Insulin Glargine Inject 40 Units into the skin 2 (two) times daily.   latanoprost 0.005 % ophthalmic solution Commonly known as:  XALATAN Place 1 drop into both  eyes at bedtime.   metFORMIN 500 MG tablet Commonly known as:  GLUCOPHAGE Take 1,000 mg by mouth 2 (two) times daily.   metoprolol tartrate 25 MG tablet Commonly known as:  LOPRESSOR Take 0.5 tablets (12.5 mg total) by mouth 2 (two) times daily.   multivitamin with minerals Tabs tablet Take 2 tablets by mouth 2 (two) times daily. COMPLETE MULTIVITAMIN   nitroGLYCERIN 0.4 MG SL tablet Commonly known as:  NITROSTAT Place 1 tablet (0.4 mg total) under the tongue every 5 (five) minutes x 3 doses as needed for chest pain.   oxyCODONE-acetaminophen 5-325 MG tablet Commonly known as:  PERCOCET/ROXICET Take 1 tablet by mouth every 4 (four) hours as needed. What changed:  reasons to take this   ticagrelor 90 MG Tabs tablet Commonly known as:  BRILINTA Take 1 tablet (90 mg total) by mouth 2 (two) times daily.        Acute coronary syndrome (MI, NSTEMI, STEMI, etc) this admission?: Yes.     AHA/ACC Clinical Performance & Quality Measures: 1. Aspirin prescribed? - Yes 2. ADP Receptor Inhibitor (Plavix/Clopidogrel, Brilinta/Ticagrelor or Effient/Prasugrel) prescribed (includes medically managed patients)? - Yes 3. Beta Blocker prescribed? - Yes 4. High Intensity Statin (Lipitor 40-80mg  or Crestor 20-40mg ) prescribed? - Yes 5. EF assessed during THIS hospitalization? - Yes 6. For EF <40%, was ACEI/ARB prescribed? - Not Applicable (EF >/= 16%) 7. For EF <40%, Aldosterone Antagonist (Spironolactone or Eplerenone) prescribed? - Not Applicable (EF >/= 60%) 8. Cardiac Rehab Phase II ordered (Included Medically managed Patients)? - Yes   Outstanding Labs/Studies   [facility]   Duration of Discharge Encounter   Greater than 30 minutes including physician time.  Signed, Kathyrn Drown, NP 07/15/2018, 12:00 PM   Agree with note by Kathyrn Drown NP  Adam Ray is stable for discharge home today.  He is postop day 3 inferolateral STEMI treated with PCI and stenting of the circumflex.   EF was normal he does have a wound VAC on his right foot status post transmetatarsal amputation by Dr. Sharol Given who will follow him up in the office.  Is been ambulating without symptoms.  Exam is benign.  Is on appropriate pharmacology.  He will  follow-up with the [facility].   Lorretta Harp, M.D., [place], [facility], [place] [address] [place], [place]  [address]  [phone] 07/15/2018 12:27 PM

## 2018-07-15 NOTE — Plan of Care (Signed)

## 2018-07-16 ENCOUNTER — Telehealth (INDEPENDENT_AMBULATORY_CARE_PROVIDER_SITE_OTHER): Payer: Self-pay | Admitting: Orthopedic Surgery

## 2018-07-16 NOTE — Telephone Encounter (Signed)
Pt wife called very upset bc Mr Trainer isn't being seen sooner than the 29th. Pt stated Dr Sharol Given told them at the hospital to come back in a week. Although I didn't see anything in the chart about a week follow up. Pt # [phone]

## 2018-07-16 NOTE — Telephone Encounter (Signed)
This pt was d/c from the hospital yesterday following a transmet amputation on 07/08/2018. He needs follow up next Thursday and there are appt times available. Please call pt and make appt.

## 2018-07-22 ENCOUNTER — Telehealth (INDEPENDENT_AMBULATORY_CARE_PROVIDER_SITE_OTHER): Payer: Self-pay | Admitting: Orthopedic Surgery

## 2018-07-22 NOTE — Telephone Encounter (Signed)
Patient called wanted to see if he could drive tomorrow he is currently in his post op period

## 2018-07-22 NOTE — Progress Notes (Signed)
Transitions of Care Follow Up Call Note  Adam Ray is an 63 y.o. male who presented to [facility] on 07/12/2018.  The patient had the following prescriptions filled at [facility]: [name], NTG, [place]  Patient was called by pharmacist and HIPAA identifiers were verified. The following questions were asked about the prescriptions filled at [facility]:  Has the patient been experiencing any side effects to the medications prescribed? no Understanding of regimen: fair Understanding of indications: fair Potential of compliance: fair  [x]  Patient's prescriptions filled at the [facility] were [facility] to the following [facility]: [facility]   []  Patient unable to be reached after calling three times and prescriptions filled at the [facility] Pharm[facility] were transferred to preferred pharmacy found within their chart.   Mills Koller 07/22/2018, 5:30 PM Transitions of Care Pharmacy Hours: Monday - Friday 8:30am to 5:00 PM  Phone - [phone]

## 2018-07-23 ENCOUNTER — Telehealth (INDEPENDENT_AMBULATORY_CARE_PROVIDER_SITE_OTHER): Payer: Self-pay | Admitting: Orthopedic Surgery

## 2018-07-23 ENCOUNTER — Ambulatory Visit (INDEPENDENT_AMBULATORY_CARE_PROVIDER_SITE_OTHER): Payer: PRIVATE HEALTH INSURANCE | Admitting: Orthopedic Surgery

## 2018-07-23 ENCOUNTER — Encounter (INDEPENDENT_AMBULATORY_CARE_PROVIDER_SITE_OTHER): Payer: Self-pay | Admitting: Orthopedic Surgery

## 2018-07-23 VITALS — Ht 70.0 in | Wt 170.0 lb

## 2018-07-23 DIAGNOSIS — Z792 Long term (current) use of antibiotics: Secondary | ICD-10-CM

## 2018-07-23 DIAGNOSIS — Z9889 Other specified postprocedural states: Secondary | ICD-10-CM

## 2018-07-23 DIAGNOSIS — Z89439 Acquired absence of unspecified foot: Secondary | ICD-10-CM

## 2018-07-23 MED ORDER — DOXYCYCLINE HYCLATE 100 MG PO TABS: 100.0000 mg | ORAL_TABLET | Freq: Two times a day (BID) | ORAL | 0 refills | Status: DC

## 2018-07-23 NOTE — Telephone Encounter (Signed)
Patient called asked if he can drive? The number to contact patient is [phone]

## 2018-07-23 NOTE — Telephone Encounter (Signed)
Pt s/p right transmet amputation on 18/2020 called pt to advise. He also  Has an appt this afternoon in which his disability paperwork will be reviewed.

## 2018-07-23 NOTE — Telephone Encounter (Signed)
Autumn returned call

## 2018-07-24 ENCOUNTER — Encounter (INDEPENDENT_AMBULATORY_CARE_PROVIDER_SITE_OTHER): Payer: Self-pay | Admitting: Orthopedic Surgery

## 2018-07-24 NOTE — Progress Notes (Signed)
Office Visit Note   Patient: Adam Ray           Date of Birth: May 01, 1956           MRN: [id number] Visit Date: 07/23/2018              Requested by: [name], [address] [place], [place] [address] PCP: [name], MD  Chief Complaint  Patient presents with  . Right Foot - Routine Post Op    TMA right foot      HPI: Patient is a 63 year old gentleman who presents in follow-up approximately 1 week status post transmetatarsal amputation.  Patient states he filled 3 canisters for the wound VAC.  Patient complains of increasing pain swelling odor and drainage.  Assessment & Plan: Visit Diagnoses:  1. History of amputation of foot through metatarsal bone ([place])     Plan: Patient will start dry dressing changes daily with cleansing with soap and water daily discussed the importance of elevation and nonweightbearing.  Prescription is called in for doxycycline.  Follow-Up Instructions: Return in about 1 week (around 07/30/2018).   Ortho Exam  Patient is alert, oriented, no adenopathy, well-dressed, normal affect, normal respiratory effort. Examination patient has maceration there is no wound dehiscence there is no cellulitis.  Imaging: No results found. No images are attached to the encounter.  Labs: Lab Results  Component Value Date   HGBA1C 7.0 (H) 07/12/2018   HGBA1C 11.1 10/21/2017   HGBA1C 9.6 07/22/2017   ESRSEDRATE 8 01/29/2016   ESRSEDRATE 9 04/11/2012   CRP 2.6 (H) 01/29/2016   CRP 0.5 02/28/2014   REPTSTATUS 02/03/2016 FINAL 01/29/2016   REPTSTATUS 02/03/2016 FINAL 01/29/2016   GRAMSTAIN  04/11/2012    NO WBC SEEN RARE SQUAMOUS EPITHELIAL CELLS PRESENT MODERATE GRAM POSITIVE COCCI IN PAIRS RARE GRAM NEGATIVE RODS   GRAMSTAIN  04/11/2012    NO WBC SEEN RARE SQUAMOUS EPITHELIAL CELLS PRESENT MODERATE GRAM POSITIVE COCCI IN PAIRS RARE GRAM NEGATIVE RODS   CULT NO GROWTH 5 DAYS 01/29/2016   CULT NO GROWTH 5 DAYS  01/29/2016   LABORGA NO GROWTH 2 DAYS 07/19/2014     Lab Results  Component Value Date   ALBUMIN 3.5 07/12/2018   ALBUMIN 3.9 10/21/2017   ALBUMIN 4.0 01/29/2017    Body mass index is 24.39 kg/m.  Orders:  No orders of the defined types were placed in this encounter.  Meds ordered this encounter  Medications  . doxycycline (VIBRA-TABS) 100 MG tablet    Sig: Take 1 tablet (100 mg total) by mouth 2 (two) times daily.    Dispense:  60 tablet    Refill:  0     Procedures: No procedures performed  Clinical Data: No additional findings.  ROS:  All other systems negative, except as noted in the HPI. Review of Systems  Objective: Vital Signs: Ht 5\' 10"  (1.778 m)   Wt 170 lb (77.1 kg)   BMI 24.39 kg/m   Specialty Comments:  No specialty comments available.  PMFS History: Patient Active Problem List   Diagnosis Date Noted  . STEMI (ST elevation myocardial infarction) ([place]) 07/12/2018  . STEMI involving left circumflex coronary artery ([place]) 07/12/2018  . Acute ST elevation myocardial infarction (STEMI) ([place])   . Status post transmetatarsal amputation of foot, right ([place]) 07/08/2018  . Osteomyelitis of third toe of right foot ([place])   . Limited mobility 05/12/2018  . Gingival foreign body 07/22/2017  . Anxiety state 06/02/2017  .  Coated tongue 04/03/2017  . Leg swelling 01/29/2017  . Status post amputation of toe of right foot ([place]) 09/24/2016  . Idiopathic chronic venous hypertension of both lower extremities with inflammation 09/24/2016  . Onychomycosis 09/24/2016  . Midfoot skin ulcer, right, limited to breakdown of skin ([place]) 09/24/2016  . Testicular mass 04/18/2016  . Thrush 04/18/2016  . Soft tissue swelling   . Osteomyelitis ([place]) 01/29/2016  . Diabetic polyneuropathy associated with type 2 diabetes mellitus ([place])   . Type 2 diabetes mellitus with neurologic complication, with long-term current use of insulin ([place]) 12/08/2015  . Left shoulder pain  08/10/2015  . Pulmonary nodule 02/01/2015  . Screening for colon cancer 01/09/2015  . Lumbar back pain 03/21/2014  . Screening for STD (sexually transmitted disease) 12/15/2013  . Depression 12/09/2013  . Lightheadedness 11/19/2013  . Warts, genital 08/20/2013  . 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 Right leg pain 04/27/2013  . Diabetic retinopathy ([place]) 01/28/2013  . Visit for well man health check 11/19/2010  . Sleep apnea 09/06/2010  . 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 LEG EDEMA 03/03/2009  . Essential hypertension 11/09/2008  . COPD, mild ([place]) 10/06/2006  . SICKLE-CELL TRAIT 04/26/2006  . ERECTILE DYSFUNCTION 04/26/2006  . Tobacco abuse 04/26/2006  . PAD (peripheral artery disease) ([place]) 04/26/2006  . ALLERGIC RHINITIS 04/26/2006   Past Medical History:  Diagnosis Date  . Allergy   . Arthritis   . Bronchitis   . Cataract   . Chronic kidney disease   . Claudication [facility])    right foot ray resection  . Colon polyps    hyperplastic  . COPD (chronic obstructive pulmonary disease) ([place])   . Diabetes mellitus   . Genital warts   . Gout   . Hyperlipidemia   . Hypertension   . Pneumonia     Family History  Problem Relation Age of Onset  . Diabetes Mother   . Stroke Mother   . Heart failure Father   . Colon cancer Neg Hx   . Esophageal cancer Neg Hx   . Rectal cancer Neg Hx   . Stomach cancer Neg Hx     Past Surgical History:  Procedure Laterality Date  . AMPUTATION Right 01/31/2016   Procedure: Right 2nd Toe Amputation;  Surgeon: Newt Minion, MD;  Location: [place];  Service: Orthopedics;  Laterality: Right;  . AMPUTATION Right 07/08/2018   Procedure: RIGHT TRANSMETATARSAL AMPUTATION;  Surgeon: Newt Minion, MD;  Location: [facility];  Service: Orthopedics;  Laterality: Right;  . CATARACT EXTRACTION     right eye  . COLONOSCOPY    . CORONARY STENT INTERVENTION N/A 07/12/2018   Procedure: CORONARY STENT INTERVENTION;  Surgeon: Troy Sine, MD;  Location: [place] [facility];  Service: Cardiovascular;  Laterality: N/A;  . CORONARY/GRAFT ACUTE MI REVASCULARIZATION N/A 07/12/2018   Procedure: Coronary/Graft Acute MI Revascularization;  Surgeon: Troy Sine, MD;  Location: [place] [facility];  Service: Cardiovascular;  Laterality: N/A;  . I&D EXTREMITY  04/11/2012   Procedure: IRRIGATION AND DEBRIDEMENT EXTREMITY;  Surgeon: Wylene Simmer, MD;  Location: [place];  Service: Orthopedics;  Laterality: Right;  . LEFT HEART CATH AND CORONARY ANGIOGRAPHY N/A 07/12/2018   Procedure: LEFT HEART CATH AND CORONARY ANGIOGRAPHY;  Surgeon: Troy Sine, MD;  Location: [place] [facility];  Service: Cardiovascular;  Laterality: N/A;  . Surgery left great toe    . Tear ducts bilateral eyes    . TRANSMETATARSAL AMPUTATION Right 07/08/2018  Social History   Occupational History  . Occupation: disabled  Tobacco Use  . Smoking status: Current Every Day Smoker    Packs/day: 0.30    Years: 48.00    Pack years: 14.40    Types: Cigars, Cigarettes    Start date: 07/02/1963  . Smokeless tobacco: Former Systems developer  . Tobacco comment: wants to use electric cigarettes.  not interested in pills, worried about chantix side effects  Substance and Sexual Activity  . Alcohol use: Yes    Alcohol/week: 0.0 standard drinks    Comment: occassional use  . Drug use: No  . Sexual activity: Not on file

## 2018-07-28 ENCOUNTER — Encounter: Payer: Self-pay | Admitting: Podiatry

## 2018-07-28 ENCOUNTER — Ambulatory Visit (INDEPENDENT_AMBULATORY_CARE_PROVIDER_SITE_OTHER): Payer: PRIVATE HEALTH INSURANCE | Admitting: Podiatry

## 2018-07-28 DIAGNOSIS — M79675 Pain in left toe(s): Secondary | ICD-10-CM

## 2018-07-28 DIAGNOSIS — E1142 Type 2 diabetes mellitus with diabetic polyneuropathy: Secondary | ICD-10-CM

## 2018-07-28 DIAGNOSIS — B351 Tinea unguium: Secondary | ICD-10-CM

## 2018-07-28 DIAGNOSIS — Z899 Acquired absence of limb, unspecified: Secondary | ICD-10-CM | POA: Diagnosis not present

## 2018-07-28 LAB — HM DIABETES FOOT EXAM

## 2018-07-28 NOTE — Progress Notes (Signed)
This patient presents to the office for nail care on his toes left foot.   Patient presents to the office with his wife. Patient has also had surgery for the removal of digits x 2  right foot.  He says this surgical site is bandaged and he is scheduled to see Dr.  Sharol Given tomorrow.  He presents to the office today for preventative foot care services for his left foot.  He says the nails are thick and long.  He is unable to self treat.    General Appearance  Alert, conversant and in no acute stress.  Vascular  Dorsalis pedis and posterior tibial  pulses are weakly  palpable  Left foot.  .  Capillary return is within normal limits  left foot.. Temperature is within normal limits  left foot..  Neurologic  Senn-Weinstein monofilament wire test diminished left foot. Muscle power diminished..  Nails Thick disfigured discolored nails with subungual debris  from hallux to fifth toes left foot. No evidence of bacterial infection or drainage bilaterally. Unable to check toes right foot due to amputation.  Orthopedic  No limitations of motion  feet .  No crepitus or effusions noted.  HAV 1st MPJ left foot.  Hammer toes 2-5 left foot. Amputations digits right foot.  Skin  normotropic skin with no porokeratosis noted bilaterally.  No signs of infections or ulcers noted.    Onychomycosis  Left foot.  Hammer toes 205  Left foot.  HAV left foot.  Debride nails left foot.  RTC prn nail care with Dr.  Elisha Ponder.  Gardiner Barefoot DPM

## 2018-07-29 ENCOUNTER — Encounter: Payer: Self-pay | Admitting: Family Medicine

## 2018-07-29 ENCOUNTER — Ambulatory Visit (INDEPENDENT_AMBULATORY_CARE_PROVIDER_SITE_OTHER): Payer: PRIVATE HEALTH INSURANCE | Admitting: Orthopedic Surgery

## 2018-07-29 ENCOUNTER — Encounter (INDEPENDENT_AMBULATORY_CARE_PROVIDER_SITE_OTHER): Payer: Self-pay | Admitting: Orthopedic Surgery

## 2018-07-29 DIAGNOSIS — Z89431 Acquired absence of right foot: Secondary | ICD-10-CM | POA: Diagnosis not present

## 2018-07-29 NOTE — Progress Notes (Signed)
Office Visit Note   Patient: Adam Ray           Date of Birth: January 29, 1956           MRN: [id number] Visit Date: 07/29/2018              Requested by: [name], [address], [place] [address] PCP: [name], MD  Chief Complaint  Patient presents with  . Right Foot - Routine Post Op    07/08/2018 right transmet amputation       HPI: Patient is a 63 year old gentleman who presents in follow-up approximately 3 weeks status post transmetatarsal amputation.  Patient complains of increasing swelling  Assessment & Plan: Visit Diagnoses:  1. Status post transmetatarsal amputation of foot, right ([place])     Plan: Will apply a dynaflex wrap and dry dresing discussed the importance of elevation and nonweightbearing.  To follow up in office in 1 more week for suture removal.  Follow-Up Instructions: Return in about 1 week (around 08/05/2018).   Ortho Exam  Patient is alert, oriented, no adenopathy, well-dressed, normal affect, normal respiratory effort. Massive pitting edema to right lower extremity. No erythema or warmth. Examination patient has maceration. Is healing medially. Incision has gaped open laterally. There is granulation in wound bed. Is open 5 mm wide and 1 mm deep. There is scant serous drainage. there is no cellulitis.  Imaging: No results found. No images are attached to the encounter.  Labs: Lab Results  Component Value Date   HGBA1C 7.0 (H) 07/12/2018   HGBA1C 11.1 10/21/2017   HGBA1C 9.6 07/22/2017   ESRSEDRATE 8 01/29/2016   ESRSEDRATE 9 04/11/2012   CRP 2.6 (H) 01/29/2016   CRP 0.5 02/28/2014   REPTSTATUS 02/03/2016 FINAL 01/29/2016   REPTSTATUS 02/03/2016 FINAL 01/29/2016   GRAMSTAIN  04/11/2012    NO WBC SEEN RARE SQUAMOUS EPITHELIAL CELLS PRESENT MODERATE GRAM POSITIVE COCCI IN PAIRS RARE GRAM NEGATIVE RODS   GRAMSTAIN  04/11/2012    NO WBC SEEN RARE SQUAMOUS EPITHELIAL CELLS PRESENT MODERATE GRAM  POSITIVE COCCI IN PAIRS RARE GRAM NEGATIVE RODS   CULT NO GROWTH 5 DAYS 01/29/2016   CULT NO GROWTH 5 DAYS 01/29/2016   LABORGA NO GROWTH 2 DAYS 07/19/2014     Lab Results  Component Value Date   ALBUMIN 3.5 07/12/2018   ALBUMIN 3.9 10/21/2017   ALBUMIN 4.0 01/29/2017    There is no height or weight on file to calculate BMI.  Orders:  No orders of the defined types were placed in this encounter.  No orders of the defined types were placed in this encounter.    Procedures: No procedures performed  Clinical Data: No additional findings.  ROS:  All other systems negative, except as noted in the HPI. Review of Systems  Objective: Vital Signs: There were no vitals taken for this visit.  Specialty Comments:  No specialty comments available.  PMFS History: Patient Active Problem List   Diagnosis Date Noted  . STEMI (ST elevation myocardial infarction) ([place]) 07/12/2018  . STEMI involving left circumflex coronary artery ([place]) 07/12/2018  . Acute ST elevation myocardial infarction (STEMI) ([place])   . Status post transmetatarsal amputation of foot, right ([place]) 07/08/2018  . Limited mobility 05/12/2018  . Gingival foreign body 07/22/2017  . Anxiety state 06/02/2017  . Coated tongue 04/03/2017  . Leg swelling 01/29/2017  . Idiopathic chronic venous hypertension of both lower extremities with inflammation 09/24/2016  . Onychomycosis 09/24/2016  .  Testicular mass 04/18/2016  . Thrush 04/18/2016  . Soft tissue swelling   . Osteomyelitis ([place]) 01/29/2016  . Diabetic polyneuropathy associated with type 2 diabetes mellitus ([place])   . Type 2 diabetes mellitus with neurologic complication, with long-term current use of insulin ([place]) 12/08/2015  . Left shoulder pain 08/10/2015  . Pulmonary nodule 02/01/2015  . Screening for colon cancer 01/09/2015  . Lumbar back pain 03/21/2014  . Screening for STD (sexually transmitted disease) 12/15/2013  . Depression 12/09/2013  .  Lightheadedness 11/19/2013  . Warts, genital 08/20/2013  . 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 Right leg pain 04/27/2013  . Diabetic retinopathy ([place]) 01/28/2013  . Visit for well man health check 11/19/2010  . Sleep apnea 09/06/2010  . 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 LEG EDEMA 03/03/2009  . Essential hypertension 11/09/2008  . COPD, mild ([place]) 10/06/2006  . SICKLE-CELL TRAIT 04/26/2006  . ERECTILE DYSFUNCTION 04/26/2006  . Tobacco abuse 04/26/2006  . PAD (peripheral artery disease) ([place]) 04/26/2006  . ALLERGIC RHINITIS 04/26/2006   Past Medical History:  Diagnosis Date  . Allergy   . Arthritis   . Bronchitis   . Cataract   . Chronic kidney disease   . Claudication [facility])    right foot ray resection  . Colon polyps    hyperplastic  . COPD (chronic obstructive pulmonary disease) ([place])   . Diabetes mellitus   . Genital warts   . Gout   . Hyperlipidemia   . Hypertension   . Osteomyelitis of third toe of right foot ([employer])   . Pneumonia   . Status post amputation of toe of right foot ([place]) 09/24/2016    Family History  Problem Relation Age of Onset  . Diabetes Mother   . Stroke Mother   . Heart failure Father   . Colon cancer Neg Hx   . Esophageal cancer Neg Hx   . Rectal cancer Neg Hx   . Stomach cancer Neg Hx     Past Surgical History:  Procedure Laterality Date  . AMPUTATION Right 01/31/2016   Procedure: Right 2nd Toe Amputation;  Surgeon: Newt Minion, MD;  Location: [place];  Service: Orthopedics;  Laterality: Right;  . AMPUTATION Right 07/08/2018   Procedure: RIGHT TRANSMETATARSAL AMPUTATION;  Surgeon: Newt Minion, MD;  Location: [facility];  Service: Orthopedics;  Laterality: Right;  . CATARACT EXTRACTION     right eye  . COLONOSCOPY    . CORONARY STENT INTERVENTION N/A 07/12/2018   Procedure: CORONARY STENT INTERVENTION;  Surgeon: Troy Sine, MD;  Location: [place] [facility];  Service: Cardiovascular;  Laterality: N/A;  .  CORONARY/GRAFT ACUTE MI REVASCULARIZATION N/A 07/12/2018   Procedure: Coronary/Graft Acute MI Revascularization;  Surgeon: Troy Sine, MD;  Location: [place] [facility];  Service: Cardiovascular;  Laterality: N/A;  . I&D EXTREMITY  04/11/2012   Procedure: IRRIGATION AND DEBRIDEMENT EXTREMITY;  Surgeon: Wylene Simmer, MD;  Location: [place];  Service: Orthopedics;  Laterality: Right;  . LEFT HEART CATH AND CORONARY ANGIOGRAPHY N/A 07/12/2018   Procedure: LEFT HEART CATH AND CORONARY ANGIOGRAPHY;  Surgeon: Troy Sine, MD;  Location: [place] [facility];  Service: Cardiovascular;  Laterality: N/A;  . Surgery left great toe    . Tear ducts bilateral eyes    . TRANSMETATARSAL AMPUTATION Right 07/08/2018   Social History   Occupational History  . Occupation: disabled  Tobacco Use  . Smoking status: Current Every Day Smoker    Packs/day: 0.30  Years: 48.00    Pack years: 14.40    Types: Cigars, Cigarettes    Start date: 07/02/1963  . Smokeless tobacco: Former Systems developer  . Tobacco comment: wants to use electric cigarettes.  not interested in pills, worried about chantix side effects  Substance and Sexual Activity  . Alcohol use: Yes    Alcohol/week: 0.0 standard drinks    Comment: occassional use  . Drug use: No  . Sexual activity: Not on file

## 2018-08-06 ENCOUNTER — Encounter (INDEPENDENT_AMBULATORY_CARE_PROVIDER_SITE_OTHER): Payer: Self-pay | Admitting: Physician Assistant

## 2018-08-06 ENCOUNTER — Ambulatory Visit (INDEPENDENT_AMBULATORY_CARE_PROVIDER_SITE_OTHER): Payer: PRIVATE HEALTH INSURANCE | Admitting: Physician Assistant

## 2018-08-06 VITALS — Ht 70.0 in | Wt 170.0 lb

## 2018-08-06 DIAGNOSIS — E1142 Type 2 diabetes mellitus with diabetic polyneuropathy: Secondary | ICD-10-CM

## 2018-08-06 DIAGNOSIS — I739 Peripheral vascular disease, unspecified: Secondary | ICD-10-CM

## 2018-08-06 DIAGNOSIS — I87323 Chronic venous hypertension (idiopathic) with inflammation of bilateral lower extremity: Secondary | ICD-10-CM

## 2018-08-06 DIAGNOSIS — Z89431 Acquired absence of right foot: Secondary | ICD-10-CM

## 2018-08-06 NOTE — Progress Notes (Signed)
Office Visit Note   Patient: Adam Ray           Date of Birth: October 15, 1955           MRN: [id number] Visit Date: 08/06/2018              Requested by: [name], [address] [place], [place] [address] PCP: [name], MD  Chief Complaint  Patient presents with  . Right Foot - Routine Post Op    07/08/2018 right transmet amputation       HPI: The patient is a 63 yo gentleman who is seen for post operative follow up following right transmetatarsal amputation 1/8/020. He has been in a Dynaflex wrap for the past week due to increased edema.  He continues on doxycycline 100 mg p.o. twice daily.  He reports he is trying to stay off the foot as much as possible.  Assessment & Plan: Visit Diagnoses:  1. Status post transmetatarsal amputation of foot, right ([place])   2. Diabetic polyneuropathy associated with type 2 diabetes mellitus ([place])   3. Idiopathic chronic venous hypertension of both lower extremities with inflammation   4. Peripheral vascular disease ([place])     Plan: A portion of the sutures were harvested this visit.  Continue doxycycline 100 mg p.o. twice daily.  Will put him back in a Dynaflex wrap for edema control.  Continue to elevate as much as possible and strict nonweightbearing is much as possible.  Follow-up in 1 week.  Follow-Up Instructions: Return in about 1 week (around 08/13/2018).   Ortho Exam  Patient is alert, oriented, no adenopathy, well-dressed, normal affect, normal respiratory effort. A portion of his sutures were removed.  He does have some dehiscence about the central incision with some serous sanguinous slightly tan appearing drainage.  His edema about the upper calf is improved and he has some wrinkling of the skin throughout but has edema about his ankle and foot still remain rather marked.  He does have palpable pedal pulse.     Imaging: No results found. No images are attached to the encounter.  Labs: Lab  Results  Component Value Date   HGBA1C 7.0 (H) 07/12/2018   HGBA1C 11.1 10/21/2017   HGBA1C 9.6 07/22/2017   ESRSEDRATE 8 01/29/2016   ESRSEDRATE 9 04/11/2012   CRP 2.6 (H) 01/29/2016   CRP 0.5 02/28/2014   REPTSTATUS 02/03/2016 FINAL 01/29/2016   REPTSTATUS 02/03/2016 FINAL 01/29/2016   GRAMSTAIN  04/11/2012    NO WBC SEEN RARE SQUAMOUS EPITHELIAL CELLS PRESENT MODERATE GRAM POSITIVE COCCI IN PAIRS RARE GRAM NEGATIVE RODS   GRAMSTAIN  04/11/2012    NO WBC SEEN RARE SQUAMOUS EPITHELIAL CELLS PRESENT MODERATE GRAM POSITIVE COCCI IN PAIRS RARE GRAM NEGATIVE RODS   CULT NO GROWTH 5 DAYS 01/29/2016   CULT NO GROWTH 5 DAYS 01/29/2016   LABORGA NO GROWTH 2 DAYS 07/19/2014     Lab Results  Component Value Date   ALBUMIN 3.5 07/12/2018   ALBUMIN 3.9 10/21/2017   ALBUMIN 4.0 01/29/2017    Body mass index is 24.39 kg/m.  Orders:  No orders of the defined types were placed in this encounter.  No orders of the defined types were placed in this encounter.    Procedures: No procedures performed  Clinical Data: No additional findings.  ROS:  All other systems negative, except as noted in the HPI. Review of Systems  Objective: Vital Signs: Ht 5\' 10"  (1.778 m)   Wt  170 lb (77.1 kg)   BMI 24.39 kg/m   Specialty Comments:  No specialty comments available.  PMFS History: Patient Active Problem List   Diagnosis Date Noted  . STEMI (ST elevation myocardial infarction) ([place]) 07/12/2018  . STEMI involving left circumflex coronary artery ([place]) 07/12/2018  . Acute ST elevation myocardial infarction (STEMI) ([place])   . Status post transmetatarsal amputation of foot, right ([place]) 07/08/2018  . Limited mobility 05/12/2018  . Gingival foreign body 07/22/2017  . Anxiety state 06/02/2017  . Coated tongue 04/03/2017  . Leg swelling 01/29/2017  . Idiopathic chronic venous hypertension of both lower extremities with inflammation 09/24/2016  . Onychomycosis 09/24/2016  .  Testicular mass 04/18/2016  . Thrush 04/18/2016  . Soft tissue swelling   . Osteomyelitis ([place]) 01/29/2016  . Diabetic polyneuropathy associated with type 2 diabetes mellitus ([place])   . Type 2 diabetes mellitus with neurologic complication, with long-term current use of insulin ([place]) 12/08/2015  . Left shoulder pain 08/10/2015  . Pulmonary nodule 02/01/2015  . Screening for colon cancer 01/09/2015  . Lumbar back pain 03/21/2014  . Screening for STD (sexually transmitted disease) 12/15/2013  . Depression 12/09/2013  . Lightheadedness 11/19/2013  . Warts, genital 08/20/2013  . 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 Right leg pain 04/27/2013  . Diabetic retinopathy ([place]) 01/28/2013  . Visit for well man health check 11/19/2010  . Sleep apnea 09/06/2010  . 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 LEG EDEMA 03/03/2009  . Essential hypertension 11/09/2008  . COPD, mild ([place]) 10/06/2006  . SICKLE-CELL TRAIT 04/26/2006  . ERECTILE DYSFUNCTION 04/26/2006  . Tobacco abuse 04/26/2006  . PAD (peripheral artery disease) ([place]) 04/26/2006  . ALLERGIC RHINITIS 04/26/2006   Past Medical History:  Diagnosis Date  . Allergy   . Arthritis   . Bronchitis   . Cataract   . Chronic kidney disease   . Claudication [facility])    right foot ray resection  . Colon polyps    hyperplastic  . COPD (chronic obstructive pulmonary disease) ([place])   . Diabetes mellitus   . Genital warts   . Gout   . Hyperlipidemia   . Hypertension   . Osteomyelitis of third toe of right foot ([place])   . Pneumonia   . Status post amputation of toe of right foot ([place]) 09/24/2016    Family History  Problem Relation Age of Onset  . Diabetes Mother   . Stroke Mother   . Heart failure Father   . Colon cancer Neg Hx   . Esophageal cancer Neg Hx   . Rectal cancer Neg Hx   . Stomach cancer Neg Hx     Past Surgical History:  Procedure Laterality Date  . AMPUTATION Right 01/31/2016   Procedure: Right  2nd Toe Amputation;  Surgeon: Newt Minion, MD;  Location: [facility];  Service: Orthopedics;  Laterality: Right;  . AMPUTATION Right 07/08/2018   Procedure: RIGHT TRANSMETATARSAL AMPUTATION;  Surgeon: Newt Minion, MD;  Location: [place];  Service: Orthopedics;  Laterality: Right;  . CATARACT EXTRACTION     right eye  . COLONOSCOPY    . CORONARY STENT INTERVENTION N/A 07/12/2018   Procedure: CORONARY STENT INTERVENTION;  Surgeon: Troy Sine, MD;  Location: [place] [facility];  Service: Cardiovascular;  Laterality: N/A;  . CORONARY/GRAFT ACUTE MI REVASCULARIZATION N/A 07/12/2018   Procedure: Coronary/Graft Acute MI Revascularization;  Surgeon: Troy Sine, MD;  Location: [place] [facility];  Service: Cardiovascular;  Laterality: N/A;  .  I&D EXTREMITY  04/11/2012   Procedure: IRRIGATION AND DEBRIDEMENT EXTREMITY;  Surgeon: Wylene Simmer, MD;  Location: [place];  Service: Orthopedics;  Laterality: Right;  . LEFT HEART CATH AND CORONARY ANGIOGRAPHY N/A 07/12/2018   Procedure: LEFT HEART CATH AND CORONARY ANGIOGRAPHY;  Surgeon: Troy Sine, MD;  Location: [place] [facility];  Service: Cardiovascular;  Laterality: N/A;  . Surgery left great toe    . Tear ducts bilateral eyes    . TRANSMETATARSAL AMPUTATION Right 07/08/2018   Social History   Occupational History  . Occupation: disabled  Tobacco Use  . Smoking status: Current Every Day Smoker    Packs/day: 0.30    Years: 48.00    Pack years: 14.40    Types: Cigars, Cigarettes    Start date: 07/02/1963  . Smokeless tobacco: Former Systems developer  . Tobacco comment: wants to use electric cigarettes.  not interested in pills, worried about chantix side effects  Substance and Sexual Activity  . Alcohol use: Yes    Alcohol/week: 0.0 standard drinks    Comment: occassional use  . Drug use: No  . Sexual activity: Not on file

## 2018-08-13 ENCOUNTER — Ambulatory Visit (INDEPENDENT_AMBULATORY_CARE_PROVIDER_SITE_OTHER): Payer: PRIVATE HEALTH INSURANCE | Admitting: Physician Assistant

## 2018-08-13 ENCOUNTER — Encounter (INDEPENDENT_AMBULATORY_CARE_PROVIDER_SITE_OTHER): Payer: Self-pay | Admitting: Physician Assistant

## 2018-08-13 VITALS — Ht 70.0 in | Wt 170.0 lb

## 2018-08-13 DIAGNOSIS — I87323 Chronic venous hypertension (idiopathic) with inflammation of bilateral lower extremity: Secondary | ICD-10-CM

## 2018-08-13 DIAGNOSIS — E1142 Type 2 diabetes mellitus with diabetic polyneuropathy: Secondary | ICD-10-CM | POA: Diagnosis not present

## 2018-08-13 DIAGNOSIS — Z89431 Acquired absence of right foot: Secondary | ICD-10-CM

## 2018-08-13 NOTE — Progress Notes (Signed)
Office Visit Note   Patient: Adam Ray           Date of Birth: 05-Feb-1956           MRN: [id number] Visit Date: 08/13/2018              Requested by: [name], [address] [place], [place] [address] PCP: [name], MD  Chief Complaint  Patient presents with  . Right Foot - Pain, Routine Post Op    07/08/2018 right transmet amputation         HPI: Patient is a 63 year old gentleman who presents about 5 weeks status post right transmetatarsal amputation.  He has been in a Dynaflex wrap for 2 weeks with 2 separate wraps for the venous stasis swelling.  Assessment & Plan: Visit Diagnoses:  1. Status post transmetatarsal amputation of foot, right ([place])   2. Diabetic polyneuropathy associated with type 2 diabetes mellitus ([place])   3. Idiopathic chronic venous hypertension of both lower extremities with inflammation     Plan: Patient is showing good improvement with the compression wraps and will continue with silver cell to the wound plus a Dynaflex wrap discussed the importance of elevation sutures harvested today.  Follow-Up Instructions: Return in about 1 week (around 08/20/2018).   Ortho Exam  Patient is alert, oriented, no adenopathy, well-dressed, normal affect, normal respiratory effort. Examination patient has significant decreased edema in the right leg with the compression wraps the wound is almost completely healed there is one open area with some mild maceration which is 1 x 5 mm.  There is no redness no cellulitis no purulence no drainage no signs of infection.  The sutures are harvested.  Silver cell and a Dynaflex wrap are applied.  Imaging: No results found. No images are attached to the encounter.  Labs: Lab Results  Component Value Date   HGBA1C 7.0 (H) 07/12/2018   HGBA1C 11.1 10/21/2017   HGBA1C 9.6 07/22/2017   ESRSEDRATE 8 01/29/2016   ESRSEDRATE 9 04/11/2012   CRP 2.6 (H) 01/29/2016   CRP 0.5 02/28/2014   REPTSTATUS 02/03/2016 FINAL 01/29/2016   REPTSTATUS 02/03/2016 FINAL 01/29/2016   GRAMSTAIN  04/11/2012    NO WBC SEEN RARE SQUAMOUS EPITHELIAL CELLS PRESENT MODERATE GRAM POSITIVE COCCI IN PAIRS RARE GRAM NEGATIVE RODS   GRAMSTAIN  04/11/2012    NO WBC SEEN RARE SQUAMOUS EPITHELIAL CELLS PRESENT MODERATE GRAM POSITIVE COCCI IN PAIRS RARE GRAM NEGATIVE RODS   CULT NO GROWTH 5 DAYS 01/29/2016   CULT NO GROWTH 5 DAYS 01/29/2016   LABORGA NO GROWTH 2 DAYS 07/19/2014     Lab Results  Component Value Date   ALBUMIN 3.5 07/12/2018   ALBUMIN 3.9 10/21/2017   ALBUMIN 4.0 01/29/2017    Body mass index is 24.39 kg/m.  Orders:  No orders of the defined types were placed in this encounter.  No orders of the defined types were placed in this encounter.    Procedures: No procedures performed  Clinical Data: No additional findings.  ROS:  All other systems negative, except as noted in the HPI. Review of Systems  Objective: Vital Signs: Ht 5\' 10"  (1.778 m)   Wt 170 lb (77.1 kg)   BMI 24.39 kg/m   Specialty Comments:  No specialty comments available.  PMFS History: Patient Active Problem List   Diagnosis Date Noted  . STEMI (ST elevation myocardial infarction) ([place]) 07/12/2018  . STEMI involving left circumflex coronary artery ([place]) 07/12/2018  .  Acute ST elevation myocardial infarction (STEMI) ([place])   . Status post transmetatarsal amputation of foot, right ([place]) 07/08/2018  . Limited mobility 05/12/2018  . Gingival foreign body 07/22/2017  . Anxiety state 06/02/2017  . Coated tongue 04/03/2017  . Leg swelling 01/29/2017  . Idiopathic chronic venous hypertension of both lower extremities with inflammation 09/24/2016  . Onychomycosis 09/24/2016  . Testicular mass 04/18/2016  . Thrush 04/18/2016  . Soft tissue swelling   . Osteomyelitis ([place]) 01/29/2016  . Diabetic polyneuropathy associated with type 2 diabetes mellitus ([place])   . Type 2 diabetes mellitus with  neurologic complication, with long-term current use of insulin ([place]) 12/08/2015  . Left shoulder pain 08/10/2015  . Pulmonary nodule 02/01/2015  . Screening for colon cancer 01/09/2015  . Lumbar back pain 03/21/2014  . Screening for STD (sexually transmitted disease) 12/15/2013  . Depression 12/09/2013  . Lightheadedness 11/19/2013  . Warts, genital 08/20/2013  . 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 Right leg pain 04/27/2013  . Diabetic retinopathy ([place]) 01/28/2013  . Visit for well man health check 11/19/2010  . Sleep apnea 09/06/2010  . 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 LEG EDEMA 03/03/2009  . Essential hypertension 11/09/2008  . COPD, mild ([place]) 10/06/2006  . SICKLE-CELL TRAIT 04/26/2006  . ERECTILE DYSFUNCTION 04/26/2006  . Tobacco abuse 04/26/2006  . PAD (peripheral artery disease) ([place]) 04/26/2006  . ALLERGIC RHINITIS 04/26/2006   Past Medical History:  Diagnosis Date  . Allergy   . Arthritis   . Bronchitis   . Cataract   . Chronic kidney disease   . Claudication [facility])    right foot ray resection  . Colon polyps    hyperplastic  . COPD (chronic obstructive pulmonary disease) ([place])   . Diabetes mellitus   . Genital warts   . Gout   . Hyperlipidemia   . Hypertension   . Osteomyelitis of third toe of right foot ([place])   . Pneumonia   . Status post amputation of toe of right foot ([place]) 09/24/2016    Family History  Problem Relation Age of Onset  . Diabetes Mother   . Stroke Mother   . Heart failure Father   . Colon cancer Neg Hx   . Esophageal cancer Neg Hx   . Rectal cancer Neg Hx   . Stomach cancer Neg Hx     Past Surgical History:  Procedure Laterality Date  . AMPUTATION Right 01/31/2016   Procedure: Right 2nd Toe Amputation;  Surgeon: Newt Minion, MD;  Location: [facility];  Service: Orthopedics;  Laterality: Right;  . AMPUTATION Right 07/08/2018   Procedure: RIGHT TRANSMETATARSAL AMPUTATION;  Surgeon: Newt Minion, MD;   Location: [facility];  Service: Orthopedics;  Laterality: Right;  . CATARACT EXTRACTION     right eye  . COLONOSCOPY    . CORONARY STENT INTERVENTION N/A 07/12/2018   Procedure: CORONARY STENT INTERVENTION;  Surgeon: Troy Sine, MD;  Location: [facility];  Service: Cardiovascular;  Laterality: N/A;  . CORONARY/GRAFT ACUTE MI REVASCULARIZATION N/A 07/12/2018   Procedure: Coronary/Graft Acute MI Revascularization;  Surgeon: Troy Sine, MD;  Location: [facility];  Service: Cardiovascular;  Laterality: N/A;  . I&D EXTREMITY  04/11/2012   Procedure: IRRIGATION AND DEBRIDEMENT EXTREMITY;  Surgeon: Wylene Simmer, MD;  Location: [facility];  Service: Orthopedics;  Laterality: Right;  . LEFT HEART CATH AND CORONARY ANGIOGRAPHY N/A 07/12/2018   Procedure: LEFT HEART CATH AND CORONARY ANGIOGRAPHY;  Surgeon: Troy Sine,  MD;  Location: [facility];  Service: Cardiovascular;  Laterality: N/A;  . Surgery left great toe    . Tear ducts bilateral eyes    . TRANSMETATARSAL AMPUTATION Right 07/08/2018   Social History   Occupational History  . Occupation: disabled  Tobacco Use  . Smoking status: Current Every Day Smoker    Packs/day: 0.30    Years: 48.00    Pack years: 14.40    Types: Cigars, Cigarettes    Start date: 07/02/1963  . Smokeless tobacco: Former Systems developer  . Tobacco comment: wants to use electric cigarettes.  not interested in pills, worried about chantix side effects  Substance and Sexual Activity  . Alcohol use: Yes    Alcohol/week: 0.0 standard drinks    Comment: occassional use  . Drug use: No  . Sexual activity: Not on file

## 2018-08-14 ENCOUNTER — Other Ambulatory Visit (INDEPENDENT_AMBULATORY_CARE_PROVIDER_SITE_OTHER): Payer: Self-pay | Admitting: Orthopedic Surgery

## 2018-08-20 ENCOUNTER — Ambulatory Visit (INDEPENDENT_AMBULATORY_CARE_PROVIDER_SITE_OTHER): Payer: PRIVATE HEALTH INSURANCE | Admitting: Physician Assistant

## 2018-08-20 ENCOUNTER — Encounter (INDEPENDENT_AMBULATORY_CARE_PROVIDER_SITE_OTHER): Payer: Self-pay | Admitting: Physician Assistant

## 2018-08-20 VITALS — Ht 70.0 in | Wt 170.0 lb

## 2018-08-20 DIAGNOSIS — I87323 Chronic venous hypertension (idiopathic) with inflammation of bilateral lower extremity: Secondary | ICD-10-CM

## 2018-08-20 DIAGNOSIS — I739 Peripheral vascular disease, unspecified: Secondary | ICD-10-CM

## 2018-08-20 DIAGNOSIS — Z89431 Acquired absence of right foot: Secondary | ICD-10-CM

## 2018-08-20 DIAGNOSIS — E1142 Type 2 diabetes mellitus with diabetic polyneuropathy: Secondary | ICD-10-CM

## 2018-08-20 NOTE — Progress Notes (Signed)
Office Visit Note   Patient: Adam Ray           Date of Birth: 02-13-1956           MRN: [id number] Visit Date: 08/20/2018              Requested by: [name], [address], [place] [address] PCP: [name], MD  Chief Complaint  Patient presents with  . Right Foot - Routine Post Op    07/08/2018 TMA right foot      HPI: The patient is a 64 year old gentleman who is about 6 weeks status post a right transmetatarsal amputation.  Surgery was on 07/08/2018.  We have been utilizing a Dynaflex compression wrap for edema control.  His edema is overall improved.  He has been trying to minimize weightbearing as much as possible and elevate as much as possible.  He has been on doxycycline.    Assessment & Plan: Visit Diagnoses:  1. Status post transmetatarsal amputation of foot, right ([place])   2. Diabetic polyneuropathy associated with type 2 diabetes mellitus ([place])   3. Idiopathic chronic venous hypertension of both lower extremities with inflammation   4. Peripheral vascular disease ([place])     Plan: Instructed patient to utilize some silver collagen to small open area over the distal amputation site and then apply Vive stump shrinker stocking for edema control.  He may use cocoa butter or Shea butter to the other areas of his foot for moisturization.  Continue doxycycline.  He should continue to minimize weightbearing as much as possible and elevate much as possible he is going to follow-up in 1 week.  Follow-Up Instructions: Return in about 1 week (around 08/27/2018).   Ortho Exam  Patient is alert, oriented, no adenopathy, well-dressed, normal affect, normal respiratory effort. Patient has a open area over the central incision which is approximately 4 x 8 x 3 mm with minimal serous drainage.  There is no bone or tendon visible and probing appears to be to soft tissue only.  His edema is overall markedly improved.  He has a right really have a  good weekend palpable dorsalis pedis pulse.      Imaging: No results found. No images are attached to the encounter.  Labs: Lab Results  Component Value Date   HGBA1C 7.0 (H) 07/12/2018   HGBA1C 11.1 10/21/2017   HGBA1C 9.6 07/22/2017   ESRSEDRATE 8 01/29/2016   ESRSEDRATE 9 04/11/2012   CRP 2.6 (H) 01/29/2016   CRP 0.5 02/28/2014   REPTSTATUS 02/03/2016 FINAL 01/29/2016   REPTSTATUS 02/03/2016 FINAL 01/29/2016   GRAMSTAIN  04/11/2012    NO WBC SEEN RARE SQUAMOUS EPITHELIAL CELLS PRESENT MODERATE GRAM POSITIVE COCCI IN PAIRS RARE GRAM NEGATIVE RODS   GRAMSTAIN  04/11/2012    NO WBC SEEN RARE SQUAMOUS EPITHELIAL CELLS PRESENT MODERATE GRAM POSITIVE COCCI IN PAIRS RARE GRAM NEGATIVE RODS   CULT NO GROWTH 5 DAYS 01/29/2016   CULT NO GROWTH 5 DAYS 01/29/2016   LABORGA NO GROWTH 2 DAYS 07/19/2014     Lab Results  Component Value Date   ALBUMIN 3.5 07/12/2018   ALBUMIN 3.9 10/21/2017   ALBUMIN 4.0 01/29/2017    Body mass index is 24.39 kg/m.  Orders:  No orders of the defined types were placed in this encounter.  No orders of the defined types were placed in this encounter.    Procedures: No procedures performed  Clinical Data: No additional findings.  ROS:  All other systems negative, except as noted in the HPI. Review of Systems  Objective: Vital Signs: Ht 5\' 10"  (1.778 m)   Wt 170 lb (77.1 kg)   BMI 24.39 kg/m   Specialty Comments:  No specialty comments available.  PMFS History: Patient Active Problem List   Diagnosis Date Noted  . STEMI (ST elevation myocardial infarction) ([place]) 07/12/2018  . STEMI involving left circumflex coronary artery ([place]) 07/12/2018  . Acute ST elevation myocardial infarction (STEMI) ([place])   . Status post transmetatarsal amputation of foot, right ([place]) 07/08/2018  . Limited mobility 05/12/2018  . Gingival foreign body 07/22/2017  . Anxiety state 06/02/2017  . Coated tongue 04/03/2017  . Leg swelling 01/29/2017    . Idiopathic chronic venous hypertension of both lower extremities with inflammation 09/24/2016  . Onychomycosis 09/24/2016  . Testicular mass 04/18/2016  . Thrush 04/18/2016  . Soft tissue swelling   . Osteomyelitis ([place]) 01/29/2016  . Diabetic polyneuropathy associated with type 2 diabetes mellitus ([place])   . Type 2 diabetes mellitus with neurologic complication, with long-term current use of insulin ([place]) 12/08/2015  . Left shoulder pain 08/10/2015  . Pulmonary nodule 02/01/2015  . Screening for colon cancer 01/09/2015  . Lumbar back pain 03/21/2014  . Screening for STD (sexually transmitted disease) 12/15/2013  . Depression 12/09/2013  . Lightheadedness 11/19/2013  . Warts, genital 08/20/2013  . 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 Right leg pain 04/27/2013  . Diabetic retinopathy ([place]) 01/28/2013  . Visit for well man health check 11/19/2010  . Sleep apnea 09/06/2010  . 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 LEG EDEMA 03/03/2009  . Essential hypertension 11/09/2008  . COPD, mild ([place]) 10/06/2006  . SICKLE-CELL TRAIT 04/26/2006  . ERECTILE DYSFUNCTION 04/26/2006  . Tobacco abuse 04/26/2006  . PAD (peripheral artery disease) ([place]) 04/26/2006  . ALLERGIC RHINITIS 04/26/2006   Past Medical History:  Diagnosis Date  . Allergy   . Arthritis   . Bronchitis   . Cataract   . Chronic kidney disease   . Claudication [facility])    right foot ray resection  . Colon polyps    hyperplastic  . COPD (chronic obstructive pulmonary disease) ([place])   . Diabetes mellitus   . Genital warts   . Gout   . Hyperlipidemia   . Hypertension   . Osteomyelitis of third toe of right foot ([place])   . Pneumonia   . Status post amputation of toe of right foot ([place]) 09/24/2016    Family History  Problem Relation Age of Onset  . Diabetes Mother   . Stroke Mother   . Heart failure Father   . Colon cancer Neg Hx   . Esophageal cancer Neg Hx   . Rectal cancer Neg Hx   .  Stomach cancer Neg Hx     Past Surgical History:  Procedure Laterality Date  . AMPUTATION Right 01/31/2016   Procedure: Right 2nd Toe Amputation;  Surgeon: Newt Minion, MD;  Location: [facility];  Service: Orthopedics;  Laterality: Right;  . AMPUTATION Right 07/08/2018   Procedure: RIGHT TRANSMETATARSAL AMPUTATION;  Surgeon: Newt Minion, MD;  Location: [facility];  Service: Orthopedics;  Laterality: Right;  . CATARACT EXTRACTION     right eye  . COLONOSCOPY    . CORONARY STENT INTERVENTION N/A 07/12/2018   Procedure: CORONARY STENT INTERVENTION;  Surgeon: Troy Sine, MD;  Location: [facility];  Service: Cardiovascular;  Laterality: N/A;  . CORONARY/GRAFT ACUTE MI REVASCULARIZATION N/A 07/12/2018  Procedure: Coronary/Graft Acute MI Revascularization;  Surgeon: Troy Sine, MD;  Location: [facility];  Service: Cardiovascular;  Laterality: N/A;  . I&D EXTREMITY  04/11/2012   Procedure: IRRIGATION AND DEBRIDEMENT EXTREMITY;  Surgeon: Wylene Simmer, MD;  Location: [facility];  Service: Orthopedics;  Laterality: Right;  . LEFT HEART CATH AND CORONARY ANGIOGRAPHY N/A 07/12/2018   Procedure: LEFT HEART CATH AND CORONARY ANGIOGRAPHY;  Surgeon: Troy Sine, MD;  Location: [facility];  Service: Cardiovascular;  Laterality: N/A;  . Surgery left great toe    . Tear ducts bilateral eyes    . TRANSMETATARSAL AMPUTATION Right 07/08/2018   Social History   Occupational History  . Occupation: disabled  Tobacco Use  . Smoking status: Current Every Day Smoker    Packs/day: 0.30    Years: 48.00    Pack years: 14.40    Types: Cigars, Cigarettes    Start date: 07/02/1963  . Smokeless tobacco: Former Systems developer  . Tobacco comment: wants to use electric cigarettes.  not interested in pills, worried about chantix side effects  Substance and Sexual Activity  . Alcohol use: Yes    Alcohol/week: 0.0 standard drinks    Comment: occassional use  . Drug use: No  . Sexual activity: Not on file

## 2018-08-21 ENCOUNTER — Encounter (INDEPENDENT_AMBULATORY_CARE_PROVIDER_SITE_OTHER): Payer: Self-pay | Admitting: Physician Assistant

## 2018-08-21 ENCOUNTER — Telehealth: Payer: Self-pay | Admitting: Podiatry

## 2018-08-21 NOTE — Telephone Encounter (Signed)
Left message for pt to call again for concerns or our appt line [phone].

## 2018-08-21 NOTE — Telephone Encounter (Signed)
Pt called requesting that the doctor give him a call back. Pt would not give me any other information regarding the call.

## 2018-08-24 ENCOUNTER — Other Ambulatory Visit: Payer: Self-pay

## 2018-08-24 NOTE — Telephone Encounter (Signed)
Faxed refill request. Patient requesting Baclofen. Not on current med list.  Danley Danker, RN [facility] RN)

## 2018-08-24 NOTE — Telephone Encounter (Signed)
Have not rx'd this for patient since September 2018. Will not prescribe at this time without an appointment.  [name], MD

## 2018-08-25 ENCOUNTER — Telehealth: Payer: Self-pay | Admitting: Podiatry

## 2018-08-25 NOTE — Telephone Encounter (Signed)
Pt refused to allow me to help him and wanted Dr. Elisha Ponder to call him.

## 2018-08-25 NOTE — Telephone Encounter (Signed)
Attempted to call patient to make an appointment in regards to Baclofen request.  There was no answer or voice mail.  Ozella Almond, [place]

## 2018-08-25 NOTE — Telephone Encounter (Signed)
Attempted to call once again to make an appointment for patient.  If patient happens to call back and still wants Baclofen, please schedule him an appointment per Dr. [name] since this medication was discontinued in September 2018.  [name] KitchenOzella Almond, CMA

## 2018-08-25 NOTE — Telephone Encounter (Signed)
Patient called to have Dr. Elisha Ponder call him back. Patient would not leave a message. I routed call to Nurse, Marcy Siren.

## 2018-08-26 ENCOUNTER — Telehealth (INDEPENDENT_AMBULATORY_CARE_PROVIDER_SITE_OTHER): Payer: Self-pay

## 2018-08-26 NOTE — Telephone Encounter (Signed)
Patient called and lm on vm to advise that he is allergic to a medicine that Dr. Sharol Given gave him states that he has had N/V. The most recent I see in his chart is doxycycline. I called and lm on vm for pt to advise that he can hold on this and can discuss with Dr. Sharol Given at appt tomorrow. Can call with any other questions otherwise will hold this message pending tomorrows appt.

## 2018-08-27 ENCOUNTER — Ambulatory Visit (INDEPENDENT_AMBULATORY_CARE_PROVIDER_SITE_OTHER): Payer: PRIVATE HEALTH INSURANCE | Admitting: Orthopedic Surgery

## 2018-08-27 ENCOUNTER — Encounter (INDEPENDENT_AMBULATORY_CARE_PROVIDER_SITE_OTHER): Payer: Self-pay | Admitting: Physician Assistant

## 2018-08-27 VITALS — Ht 70.0 in | Wt 170.0 lb

## 2018-08-27 DIAGNOSIS — E1142 Type 2 diabetes mellitus with diabetic polyneuropathy: Secondary | ICD-10-CM

## 2018-08-27 DIAGNOSIS — Z89431 Acquired absence of right foot: Secondary | ICD-10-CM

## 2018-08-27 DIAGNOSIS — I87323 Chronic venous hypertension (idiopathic) with inflammation of bilateral lower extremity: Secondary | ICD-10-CM

## 2018-08-27 NOTE — Progress Notes (Signed)
Office Visit Note   Patient: Adam Ray           Date of Birth: 06-29-1956           MRN: [id number] Visit Date: 08/27/2018              Requested by: Adam Ray, [address] [place], [place] [address] PCP: Adam Rio, MD  Chief Complaint  Patient presents with  . Right Foot - Routine Post Op    07/08/2018 Right TMA      HPI: Patient is a 63 year old gentleman who presents status post right transmetatarsal amputation.  Patient states that his current shoe wear does not provide enough support.  He states he has a little bit of drainage from the ulcer and has some swelling from the venous insufficiency.  Assessment & Plan: Visit Diagnoses:  1. Status post transmetatarsal amputation of foot, right ([place])   2. Diabetic polyneuropathy associated with type 2 diabetes mellitus ([place])   3. Idiopathic chronic venous hypertension of both lower extremities with inflammation     Plan: The foot wound is almost completely healed there is no signs of infection.  Patient is given a prescription for [employer] for extra-depth shoes custom orthotic spacer and double upright brace for the right shoe.  Reevaluate in 3 months.  Follow-Up Instructions: Return in about 3 months (around 11/25/2018).   Ortho Exam  Patient is alert, oriented, no adenopathy, well-dressed, normal affect, normal respiratory effort. Examination patient has venous swelling in the right lower extremity but no open ulcers.  His transmetatarsal amputation is almost completely healed there is an area of granulation tissue that is less than 5 mm in diameter.  There is no odor no cellulitis no signs of infection.  His foot is plantigrade.  Imaging: No results found. No images are attached to the encounter.  Labs: Lab Results  Component Value Date   HGBA1C 7.0 (H) 07/12/2018   HGBA1C 11.1 10/21/2017   HGBA1C 9.6 07/22/2017   ESRSEDRATE 8 01/29/2016   ESRSEDRATE 9 04/11/2012   CRP 2.6 (H)  01/29/2016   CRP 0.5 02/28/2014   REPTSTATUS 02/03/2016 FINAL 01/29/2016   REPTSTATUS 02/03/2016 FINAL 01/29/2016   GRAMSTAIN  04/11/2012    NO WBC SEEN RARE SQUAMOUS EPITHELIAL CELLS PRESENT MODERATE GRAM POSITIVE COCCI IN PAIRS RARE GRAM NEGATIVE RODS   GRAMSTAIN  04/11/2012    NO WBC SEEN RARE SQUAMOUS EPITHELIAL CELLS PRESENT MODERATE GRAM POSITIVE COCCI IN PAIRS RARE GRAM NEGATIVE RODS   CULT NO GROWTH 5 DAYS 01/29/2016   CULT NO GROWTH 5 DAYS 01/29/2016   LABORGA NO GROWTH 2 DAYS 07/19/2014     Lab Results  Component Value Date   ALBUMIN 3.5 07/12/2018   ALBUMIN 3.9 10/21/2017   ALBUMIN 4.0 01/29/2017    Body mass index is 24.39 kg/m.  Orders:  No orders of the defined types were placed in this encounter.  No orders of the defined types were placed in this encounter.    Procedures: No procedures performed  Clinical Data: No additional findings.  ROS:  All other systems negative, except as noted in the HPI. Review of Systems  Objective: Vital Signs: Ht 5\' 10"  (1.778 m)   Wt 170 lb (77.1 kg)   BMI 24.39 kg/m   Specialty Comments:  No specialty comments available.  PMFS History: Patient Active Problem List   Diagnosis Date Noted  . STEMI (ST elevation myocardial infarction) ([place]) 07/12/2018  . STEMI involving left  circumflex coronary artery ([place]) 07/12/2018  . Acute ST elevation myocardial infarction (STEMI) ([place])   . Status post transmetatarsal amputation of foot, right ([place]) 07/08/2018  . Limited mobility 05/12/2018  . Gingival foreign body 07/22/2017  . Anxiety state 06/02/2017  . Coated tongue 04/03/2017  . Leg swelling 01/29/2017  . Idiopathic chronic venous hypertension of both lower extremities with inflammation 09/24/2016  . Onychomycosis 09/24/2016  . Testicular mass 04/18/2016  . Thrush 04/18/2016  . Soft tissue swelling   . Osteomyelitis ([place]) 01/29/2016  . Diabetic polyneuropathy associated with type 2 diabetes mellitus ([place])     . Type 2 diabetes mellitus with neurologic complication, with long-term current use of insulin ([place]) 12/08/2015  . Left shoulder pain 08/10/2015  . Pulmonary nodule 02/01/2015  . Screening for colon cancer 01/09/2015  . Lumbar back pain 03/21/2014  . Screening for STD (sexually transmitted disease) 12/15/2013  . Depression 12/09/2013  . Lightheadedness 11/19/2013  . Warts, genital 08/20/2013  . 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 Right leg pain 04/27/2013  . Diabetic retinopathy ([place]) 01/28/2013  . Visit for well man health check 11/19/2010  . Sleep apnea 09/06/2010  . 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 LEG EDEMA 03/03/2009  . Essential hypertension 11/09/2008  . COPD, mild ([place]) 10/06/2006  . SICKLE-CELL TRAIT 04/26/2006  . ERECTILE DYSFUNCTION 04/26/2006  . Tobacco abuse 04/26/2006  . PAD (peripheral artery disease) ([place]) 04/26/2006  . ALLERGIC RHINITIS 04/26/2006   Past Medical History:  Diagnosis Date  . Allergy   . Arthritis   . Bronchitis   . Cataract   . Chronic kidney disease   . Claudication [facility])    right foot ray resection  . Colon polyps    hyperplastic  . COPD (chronic obstructive pulmonary disease) ([place])   . Diabetes mellitus   . Genital warts   . Gout   . Hyperlipidemia   . Hypertension   . Osteomyelitis of third toe of right foot ([place])   . Pneumonia   . Status post amputation of toe of right foot ([place]) 09/24/2016    Family History  Problem Relation Age of Onset  . Diabetes Mother   . Stroke Mother   . Heart failure Father   . Colon cancer Neg Hx   . Esophageal cancer Neg Hx   . Rectal cancer Neg Hx   . Stomach cancer Neg Hx     Past Surgical History:  Procedure Laterality Date  . AMPUTATION Right 01/31/2016   Procedure: Right 2nd Toe Amputation;  Surgeon: Newt Minion, MD;  Location: [facility];  Service: Orthopedics;  Laterality: Right;  . AMPUTATION Right 07/08/2018   Procedure: RIGHT TRANSMETATARSAL AMPUTATION;   Surgeon: Newt Minion, MD;  Location: [facility];  Service: Orthopedics;  Laterality: Right;  . CATARACT EXTRACTION     right eye  . COLONOSCOPY    . CORONARY STENT INTERVENTION N/A 07/12/2018   Procedure: CORONARY STENT INTERVENTION;  Surgeon: Troy Sine, MD;  Location: [facility];  Service: Cardiovascular;  Laterality: N/A;  . CORONARY/GRAFT ACUTE MI REVASCULARIZATION N/A 07/12/2018   Procedure: Coronary/Graft Acute MI Revascularization;  Surgeon: Troy Sine, MD;  Location: [facility];  Service: Cardiovascular;  Laterality: N/A;  . I&D EXTREMITY  04/11/2012   Procedure: IRRIGATION AND DEBRIDEMENT EXTREMITY;  Surgeon: Wylene Simmer, MD;  Location: [facility];  Service: Orthopedics;  Laterality: Right;  . LEFT HEART CATH AND CORONARY ANGIOGRAPHY N/A 07/12/2018   Procedure: LEFT HEART CATH  AND CORONARY ANGIOGRAPHY;  Surgeon: Troy Sine, MD;  Location: [facility];  Service: Cardiovascular;  Laterality: N/A;  . Surgery left great toe    . Tear ducts bilateral eyes    . TRANSMETATARSAL AMPUTATION Right 07/08/2018   Social History   Occupational History  . Occupation: disabled  Tobacco Use  . Smoking status: Current Every Day Smoker    Packs/day: 0.30    Years: 48.00    Pack years: 14.40    Types: Cigars, Cigarettes    Start date: 07/02/1963  . Smokeless tobacco: Former Systems developer  . Tobacco comment: wants to use electric cigarettes.  not interested in pills, worried about chantix side effects  Substance and Sexual Activity  . Alcohol use: Yes    Alcohol/week: 0.0 standard drinks    Comment: occassional use  . Drug use: No  . Sexual activity: Not on file

## 2018-08-31 ENCOUNTER — Encounter: Payer: Self-pay | Admitting: Family Medicine

## 2018-08-31 ENCOUNTER — Ambulatory Visit (INDEPENDENT_AMBULATORY_CARE_PROVIDER_SITE_OTHER): Payer: Medicare HMO | Admitting: Family Medicine

## 2018-08-31 ENCOUNTER — Other Ambulatory Visit: Payer: Self-pay

## 2018-08-31 VITALS — BP 118/80 | HR 77 | Temp 98.0°F | Ht 70.0 in | Wt 180.2 lb

## 2018-08-31 DIAGNOSIS — E1142 Type 2 diabetes mellitus with diabetic polyneuropathy: Secondary | ICD-10-CM | POA: Diagnosis not present

## 2018-08-31 DIAGNOSIS — Z720 Tobacco use: Secondary | ICD-10-CM | POA: Diagnosis not present

## 2018-08-31 DIAGNOSIS — R609 Edema, unspecified: Secondary | ICD-10-CM | POA: Diagnosis not present

## 2018-08-31 DIAGNOSIS — Z0000 Encounter for general adult medical examination without abnormal findings: Secondary | ICD-10-CM

## 2018-08-31 MED ORDER — TETANUS-DIPHTH-ACELL PERTUSSIS 5-2.5-18.5 LF-MCG/0.5 IM SUSP: 0.5000 mL | Freq: Once | INTRAMUSCULAR | 0 refills | Status: AC

## 2018-08-31 NOTE — Assessment & Plan Note (Signed)
Congratulated patient on quitting smoking 

## 2018-08-31 NOTE — Progress Notes (Signed)
Date of Visit: 08/31/2018   HPI:  Patient presents today for a well adult male exam.   Concerns today: see below Sexual activity: yes STD Screening: declines today Exercise: walking some for exercise Smoking: quit smoking altogether Alcohol: drinks 2-4 times per month Drugs: no Mood: no concerns, doing well  Swelling - wants fluid medicine, has had swelling in legs increasing lately, no shortness of breath   Tongue - patient still convinced his tongue is white. Wants medication to treat it. Unclear if he ever saw ENT for this  ROS: See HPI  [place]:  Cancers in family: none that patient knows of  PHYSICAL EXAM: BP 118/80   Pulse 77   Temp 98 F (36.7 C) (Oral)   Ht 5\' 10"  (1.778 m)   Wt 180 lb 3.2 oz (81.7 kg)   SpO2 99%   BMI 25.86 kg/m  Gen: NAD, pleasant, cooperative HEENT: NCAT, PERRL, no palpable thyromegaly or anterior cervical lymphadenopathy. Tongue with whitish appearance but seems normal variant, no definite thrush. Heart: RRR, no murmurs Lungs: CTAB, NWOB Abdomen: soft, nontender to palpation Neuro: grossly nonfocal, speech normal GU: not examined Extremities: 2-3+ edema bilateral lower extremities   ASSESSMENT/PLAN:  Health maintenance:  -STD screening: declines today -immunizations: discussed recommendations regarding shingrix, patient wants to wait. Given Rx for Tdap. -lipid screening: current on lipids -colonoscopy: current  -prostate cancer screening: reviewed screening strategy - DRE and PSA. Patient prefers to wait on these. -handout given on health maintenance topics  Tongue concern Outside the scope of primary care; I have previously tried to treat this with antifungals and it did not improve. I am not convinced there is thrush and believe he would be served by seeing a specialist. Offered ENT referral here or for patient to have this done at the [facility], where he gets the majority of his care. He will schedule with ENT at the [facility].  Tobacco  abuse Congratulated patient on quitting smoking.  Diabetic polyneuropathy associated with type 2 diabetes mellitus ([place]) Getting all diabetes care at the [facility].  LEG EDEMA Quite edematous. Recent echo with G1DD. No dyspneic symptoms. Will do trial of lasix 40mg  daily. Follow up in 1-2 weeks to reassess, plan to check BMET at that visit.   FOLLOW UP: Follow up in 1-2 weeks for leg swelling Patient to schedule with ENT at [facility]. [name], [place]

## 2018-08-31 NOTE — Patient Instructions (Signed)
Get tetanus shot at your pharmacy Take lasix 40mg  daily Follow up with me in 1-2 weeks to see how swelling is doing  See ENT at the [facility] for your tongue  Be well, Dr. [name]     Health Maintenance, Male A healthy lifestyle and preventive care is important for your health and wellness. Ask your health care provider about what schedule of regular examinations is right for you. What should I know about weight and diet? Eat a Healthy Diet  Eat plenty of vegetables, fruits, whole grains, low-fat dairy products, and lean protein.  Do not eat a lot of foods high in solid fats, added sugars, or salt.  Maintain a Healthy Weight Regular exercise can help you achieve or maintain a healthy weight. You should:  Do at least 150 minutes of exercise each week. The exercise should increase your heart rate and make you sweat (moderate-intensity exercise).  Do strength-training exercises at least twice a week. Watch Your Levels of Cholesterol and Blood Lipids  Have your blood tested for lipids and cholesterol every 5 years starting at 64 years of age. If you are at high risk for heart disease, you should start having your blood tested when you are 64 years old. You may need to have your cholesterol levels checked more often if: ? Your lipid or cholesterol levels are high. ? You are older than 64 years of age. ? You are at high risk for heart disease. What should I know about cancer screening? Many types of cancers can be detected early and may often be prevented. Lung Cancer  You should be screened every year for lung cancer if: ? You are a current smoker who has smoked for at least 30 years. ? You are a former smoker who has quit within the past 15 years.  Talk to your health care provider about your screening options, when you should start screening, and how often you should be screened. Colorectal Cancer  Routine colorectal cancer screening usually begins at 64 years of age and should be  repeated every 5-10 years until you are 64 years old. You may need to be screened more often if early forms of precancerous polyps or small growths are found. Your health care provider may recommend screening at an earlier age if you have risk factors for colon cancer.  Your health care provider may recommend using home test kits to check for hidden blood in the stool.  A small camera at the end of a tube can be used to examine your colon (sigmoidoscopy or colonoscopy). This checks for the earliest forms of colorectal cancer. Prostate and Testicular Cancer  Depending on your age and overall health, your health care provider may do certain tests to screen for prostate and testicular cancer.  Talk to your health care provider about any symptoms or concerns you have about testicular or prostate cancer. Skin Cancer  Check your skin from head to toe regularly.  Tell your health care provider about any new moles or changes in moles, especially if: ? There is a change in a mole's size, shape, or color. ? You have a mole that is larger than a pencil eraser.  Always use sunscreen. Apply sunscreen liberally and repeat throughout the day.  Protect yourself by wearing long sleeves, pants, a wide-brimmed hat, and sunglasses when outside. What should I know about heart disease, diabetes, and high blood pressure?  If you are 62-32 years of age, have your blood pressure checked every 3-5 years.  If you are 8 years of age or older, have your blood pressure checked every year. You should have your blood pressure measured twice-once when you are at a hospital or clinic, and once when you are not at a hospital or clinic. Record the average of the two measurements. To check your blood pressure when you are not at a hospital or clinic, you can use: ? An automated blood pressure machine at a pharmacy. ? A home blood pressure monitor.  Talk to your health care provider about your target blood pressure.  If you  are between 81-20 years old, ask your health care provider if you should take aspirin to prevent heart disease.  Have regular diabetes screenings by checking your fasting blood sugar level. ? If you are at a normal weight and have a low risk for diabetes, have this test once every three years after the age of 59. ? If you are overweight and have a high risk for diabetes, consider being tested at a younger age or more often.  A one-time screening for abdominal aortic aneurysm (AAA) by ultrasound is recommended for men aged 38-75 years who are current or former smokers. What should I know about preventing infection? Hepatitis B If you have a higher risk for hepatitis B, you should be screened for this virus. Talk with your health care provider to find out if you are at risk for hepatitis B infection. Hepatitis C Blood testing is recommended for:  Everyone born from [birth year] through 1966.  Anyone with known risk factors for hepatitis C. Sexually Transmitted Diseases (STDs)  You should be screened each year for STDs including gonorrhea and chlamydia if: ? You are sexually active and are younger than 64 years of age. ? You are older than 64 years of age and your health care provider tells you that you are at risk for this type of infection. ? Your sexual activity has changed since you were last screened and you are at an increased risk for chlamydia or gonorrhea. Ask your health care provider if you are at risk.  Talk with your health care provider about whether you are at high risk of being infected with HIV. Your health care provider may recommend a prescription medicine to help prevent HIV infection. What else can I do?  Schedule regular health, dental, and eye exams.  Stay current with your vaccines (immunizations).  Do not use any tobacco products, such as cigarettes, chewing tobacco, and e-cigarettes. If you need help quitting, ask your health care provider.  Limit alcohol intake to no  more than 2 drinks per day. One drink equals 12 ounces of beer, 5 ounces of wine, or 1 ounces of hard liquor.  Do not use street drugs.  Do not share needles.  Ask your health care provider for help if you need support or information about quitting drugs.  Tell your health care provider if you often feel depressed.  Tell your health care provider if you have ever been abused or do not feel safe at home. This information is not intended to replace advice given to you by your health care provider. Make sure you discuss any questions you have with your health care provider. Document Released: 12/14/2007 Document Revised: 02/14/2016 Document Reviewed: 03/21/2015 Elsevier Interactive Patient Education  2019 [employer].

## 2018-08-31 NOTE — Assessment & Plan Note (Signed)
Quite edematous. Recent echo with G1DD. No dyspneic symptoms. Will do trial of lasix 40mg  daily. Follow up in 1-2 weeks to reassess, plan to check BMET at that visit.

## 2018-08-31 NOTE — Assessment & Plan Note (Signed)
Getting all diabetes care at the [facility].

## 2018-09-02 ENCOUNTER — Telehealth: Payer: Self-pay | Admitting: *Deleted

## 2018-09-02 MED ORDER — FUROSEMIDE 40 MG PO TABS: 40.0000 mg | ORAL_TABLET | Freq: Every day | ORAL | 1 refills | Status: DC

## 2018-09-02 NOTE — Telephone Encounter (Signed)
Pt calls because he thought he was supposed to get a medication yesterday.  To MD. Clinton Sawyer, Salome Spotted, [place]

## 2018-09-02 NOTE — Telephone Encounter (Signed)
I sent in a prescription for lasix for him just now. Please apologize to him on my behalf for failing to send it yesterday. Thank you!  [name], MD

## 2018-09-07 ENCOUNTER — Ambulatory Visit: Payer: Non-veteran care | Admitting: Family Medicine

## 2018-09-13 ENCOUNTER — Other Ambulatory Visit (INDEPENDENT_AMBULATORY_CARE_PROVIDER_SITE_OTHER): Payer: Self-pay | Admitting: Orthopedic Surgery

## 2018-09-15 NOTE — Telephone Encounter (Signed)
Does this pt need to continue with ABX?

## 2018-09-15 NOTE — Telephone Encounter (Signed)
Does not need to continue

## 2018-09-18 ENCOUNTER — Telehealth (HOSPITAL_COMMUNITY): Payer: Self-pay

## 2018-09-18 NOTE — Telephone Encounter (Signed)
Attempted to call patient in regards to Cardiac Rehab - LM on VM also need to ask pt which insurance is he planning on using the [place]. Tedra Senegal. Support Rep II

## 2018-09-18 NOTE — Telephone Encounter (Signed)
Pt insurance is active and benefits verified through [place] $10.00, DED 0/0 met, out of pocket $3,400/$885 met, co-insurance 0. no pre-authorization required. Passport, 09/18/2018 @ 12:27pm , REF# [id number]  Will contact patient to see if he is interested in the Cardiac Rehab Program. If interested, patient will need to complete follow up appt. Once completed, patient will be contacted for scheduling upon review by the RN Navigator.

## 2018-09-24 ENCOUNTER — Telehealth: Payer: Self-pay | Admitting: Family Medicine

## 2018-09-24 NOTE — Telephone Encounter (Signed)
Contacted patient about possibility of postponing appointment due to [place] pandemic, or to see if there are issues that can be addressed over the phone rather than in-person.  Patient reports he has no acute concerns and is fine postponing his appointment. Advised him to call back in 1 month to reschedule. Appointment canceled.  Encouraged patient to stay home as much as possible to avoid exposure to COVID. Educated on emphasis of telehealth right now to minimize risk of disease transmission. Advised he call us with any concerns or needs.   Patient appreciative.  [name], MD

## 2018-09-28 ENCOUNTER — Ambulatory Visit: Payer: Non-veteran care | Admitting: Family Medicine

## 2018-10-02 ENCOUNTER — Telehealth: Payer: Self-pay

## 2018-10-02 NOTE — Telephone Encounter (Signed)
Cardiac Questionnaire:    Since your last visit or hospitalization:    1. Have you been having new or worsening chest pain? NO   2. Have you been having new or worsening shortness of breath? NO 3. Have you been having new or worsening leg swelling, wt gain, or increase in abdominal girth (pants fitting more tightly)?  LEG SWELLING AFTER AMPUTATION OF 4 TOES   4. Have you had any passing out spells? NO    *A YES to any of these questions would result in the appointment being kept. *If all the answers to these questions are NO, we should indicate that given the current situation regarding the worldwide coronarvirus pandemic, at the recommendation of the CDC, we are looking to limit gatherings in our waiting area, and thus will reschedule their appointment beyond four weeks from today.   _____________   [id number] Pre-Screening Questions:  . Do you currently have a fever? NO (yes = cancel and refer to pcp for e-visit) . Have you recently travelled on a cruise, internationally, or to [place], [place], [place], [place], [place], or [place], [place] [employer]) ? NO (yes = cancel, stay home, monitor symptoms, and contact pcp or initiate e-visit if symptoms develop) . Have you been in contact with someone that is currently pending confirmation of Covid19 testing or has been confirmed to have the [place] virus?  NO (yes = cancel, stay home, away from tested individual, monitor symptoms, and contact pcp or initiate e-visit if symptoms develop) . Are you currently experiencing fatigue or cough? YES (yes = pt should be prepared to have a mask placed at the time of their visit).       Virtual Visit Pre-Appointment Phone Call  Steps For Call:  1. Confirm consent - "In the setting of the current Covid19 crisis, you are scheduled for a (phone or video) visit with your provider on (date) at (time).  Just as we do with many in-office visits, in order for you to participate in this visit, we must obtain consent.  If you'd  like, I can send this to your mychart (if signed up) or email for you to review.  Otherwise, I can obtain your verbal consent now.  All virtual visits are billed to your insurance company just like a normal visit would be.  By agreeing to a virtual visit, we'd like you to understand that the technology does not allow for your provider to perform an examination, and thus may limit your provider's ability to fully assess your condition.  Finally, though the technology is pretty good, we cannot assure that it will always work on either your or our end, and in the setting of a video visit, we may have to convert it to a phone-only visit.  In either situation, we cannot ensure that we have a secure connection.  Are you willing to proceed?"  2. Give patient instructions for WebEx download to smartphone as below if video visit  3. Advise patient to be prepared with any vital sign or heart rhythm information, their current medicines, and a piece of paper and pen handy for any instructions they may receive the day of their visit  4. Inform patient they will receive a phone call 15 minutes prior to their appointment time (may be from unknown caller ID) so they should be prepared to answer  5. Confirm that appointment type is correct in Epic appointment notes (video vs telephone)    TELEPHONE CALL NOTE  ELIJAHJAMES FUELLING has  been deemed a candidate for a follow-up tele-health visit to limit community exposure during the Covid-19 pandemic. I spoke with the patient via phone to ensure availability of phone/video source, confirm preferred email & phone number, and discuss instructions and expectations.  I reminded SIDI DZIKOWSKI to be prepared with any vital sign and/or heart rhythm information that could potentially be obtained via home monitoring, at the time of his visit. I reminded WITTEN CERTAIN to expect a phone call at the time of his visit if his visit.  Did the patient verbally acknowledge consent to treatment?  YES  Belvin Gauss Dawna Part, RN 10/02/2018    DOWNLOADING THE [place], go to App Store and type in WebEx in the search bar. [place] [employer], the blue/green circle. The app is free but as with any other app downloads, their phone may require them to verify saved payment information or Apple password. The patient does NOT have to create an account.  - If Android, ask patient to go to [employer] and type in WebEx in the search bar. [place] [employer], the blue/green circle. The app is free but as with any other app downloads, their phone may require them to verify saved payment information or Android password. The patient does NOT have to create an account.   CONSENT FOR TELE-HEALTH VISIT - PLEASE REVIEW  I hereby voluntarily request, consent and authorize [facility] and its employed or contracted physicians, physician assistants, nurse practitioners or other licensed health care professionals (the Practitioner), to provide me with telemedicine health care services (the "Services") as deemed necessary by the treating Practitioner. I acknowledge and consent to receive the Services by the Practitioner via telemedicine. I understand that the telemedicine visit will involve communicating with the Practitioner through live audiovisual communication technology and the disclosure of certain medical information by electronic transmission. I acknowledge that I have been given the opportunity to request an in-person assessment or other available alternative prior to the telemedicine visit and am voluntarily participating in the telemedicine visit.  I understand that I have the right to withhold or withdraw my consent to the use of telemedicine in the course of my care at any time, without affecting my right to future care or treatment, and that the Practitioner or I may terminate the telemedicine visit at any time. I understand that I have the right  to inspect all information obtained and/or recorded in the course of the telemedicine visit and may receive copies of available information for a reasonable fee.  I understand that some of the potential risks of receiving the Services via telemedicine include:  [name] Delay or interruption in medical evaluation due to technological equipment failure or disruption; . Information transmitted may not be sufficient (e.g. poor resolution of images) to allow for appropriate medical decision making by the Practitioner; and/or  . In rare instances, security protocols could fail, causing a breach of personal health information.  Furthermore, I acknowledge that it is my responsibility to provide information about my medical history, conditions and care that is complete and accurate to the best of my ability. I acknowledge that Practitioner's advice, recommendations, and/or decision may be based on factors not within their control, such as incomplete or inaccurate data provided by me or distortions of diagnostic images or specimens that may result from electronic transmissions. I understand that the practice of medicine is not an exact science and that Practitioner makes no warranties or  guarantees regarding treatment outcomes. I acknowledge that I will receive a copy of this consent concurrently upon execution via email to the email address I last provided but may also request a printed copy by calling the office of [place].    I understand that my insurance will be billed for this visit.   I have read or had this consent read to me. . I understand the contents of this consent, which adequately explains the benefits and risks of the Services being provided via telemedicine.  . I have been provided ample opportunity to ask questions regarding this consent and the Services and have had my questions answered to my satisfaction. . I give my informed consent for the services to be provided through the use of  telemedicine in my medical care  By participating in this telemedicine visit I agree to the above.

## 2018-10-05 ENCOUNTER — Other Ambulatory Visit: Payer: Self-pay

## 2018-10-06 ENCOUNTER — Telehealth: Payer: Self-pay

## 2018-10-06 ENCOUNTER — Other Ambulatory Visit: Payer: Self-pay

## 2018-10-06 ENCOUNTER — Telehealth (INDEPENDENT_AMBULATORY_CARE_PROVIDER_SITE_OTHER): Payer: No Typology Code available for payment source | Admitting: Cardiovascular Disease

## 2018-10-06 DIAGNOSIS — I2121 ST elevation (STEMI) myocardial infarction involving left circumflex coronary artery: Secondary | ICD-10-CM

## 2018-10-06 MED ORDER — TICAGRELOR 90 MG PO TABS: 90.0000 mg | ORAL_TABLET | Freq: Two times a day (BID) | ORAL | 11 refills | Status: DC

## 2018-10-06 MED ORDER — METOPROLOL TARTRATE 25 MG PO TABS: 12.5000 mg | ORAL_TABLET | Freq: Two times a day (BID) | ORAL | 11 refills | Status: DC

## 2018-10-06 NOTE — Telephone Encounter (Signed)
Virtual Visit Pre-Appointment Phone Call  Steps For Call:  1. Confirm consent - "In the setting of the current Covid19 crisis, you are scheduled for a (phone or video) visit with your provider on (date) at (time).  Just as we do with many in-office visits, in order for you to participate in this visit, we must obtain consent.  If you'd like, I can send this to your mychart (if signed up) or email for you to review.  Otherwise, I can obtain your verbal consent now.  All virtual visits are billed to your insurance company just like a normal visit would be.  By agreeing to a virtual visit, we'd like you to understand that the technology does not allow for your provider to perform an examination, and thus may limit your provider's ability to fully assess your condition.  Finally, though the technology is pretty good, we cannot assure that it will always work on either your or our end, and in the setting of a video visit, we may have to convert it to a phone-only visit.  In either situation, we cannot ensure that we have a secure connection.  Are you willing to proceed?"  2. Give patient instructions for WebEx download to smartphone as below if video visit  3. Advise patient to be prepared with any vital sign or heart rhythm information, their current medicines, and a piece of paper and pen handy for any instructions they may receive the day of their visit  4. Inform patient they will receive a phone call 15 minutes prior to their appointment time (may be from unknown caller ID) so they should be prepared to answer  5. Confirm that appointment type is correct in Epic appointment notes (video vs telephone)    TELEPHONE CALL NOTE  Adam Ray has been deemed a candidate for a follow-up tele-health visit to limit community exposure during the Covid-19 pandemic. I spoke with the patient via phone to ensure availability of phone/video source, confirm preferred email & phone number, and discuss  instructions and expectations.  I reminded Adam Ray to be prepared with any vital sign and/or heart rhythm information that could potentially be obtained via home monitoring, at the time of his visit. I reminded Adam Ray to expect a phone call at the time of his visit if his visit.  Did the patient verbally acknowledge consent to treatment? yes  Annita Brod, RN 10/06/2018    DOWNLOADING THE [place], go to App Store and type in WebEx in the search bar. [place] [employer], the blue/green circle. The app is free but as with any other app downloads, their phone may require them to verify saved payment information or Apple password. The patient does NOT have to create an account.  - If Android, ask patient to go to [employer] and type in WebEx in the search bar. [place] [employer], the blue/green circle. The app is free but as with any other app downloads, their phone may require them to verify saved payment information or Android password. The patient does NOT have to create an account.   CONSENT FOR TELE-HEALTH VISIT - PLEASE REVIEW  I hereby voluntarily request, consent and authorize [facility] and its employed or contracted physicians, physician assistants, nurse practitioners or other licensed health care professionals (the Practitioner), to provide me with telemedicine health care services (the "Services") as deemed necessary by the treating Practitioner. I  acknowledge and consent to receive the Services by the Practitioner via telemedicine. I understand that the telemedicine visit will involve communicating with the Practitioner through live audiovisual communication technology and the disclosure of certain medical information by electronic transmission. I acknowledge that I have been given the opportunity to request an in-person assessment or other available alternative prior to the telemedicine visit and am voluntarily  participating in the telemedicine visit.  I understand that I have the right to withhold or withdraw my consent to the use of telemedicine in the course of my care at any time, without affecting my right to future care or treatment, and that the Practitioner or I may terminate the telemedicine visit at any time. I understand that I have the right to inspect all information obtained and/or recorded in the course of the telemedicine visit and may receive copies of available information for a reasonable fee.  I understand that some of the potential risks of receiving the Services via telemedicine include:  [name] Delay or interruption in medical evaluation due to technological equipment failure or disruption; . Information transmitted may not be sufficient (e.g. poor resolution of images) to allow for appropriate medical decision making by the Practitioner; and/or  . In rare instances, security protocols could fail, causing a breach of personal health information.  Furthermore, I acknowledge that it is my responsibility to provide information about my medical history, conditions and care that is complete and accurate to the best of my ability. I acknowledge that Practitioner's advice, recommendations, and/or decision may be based on factors not within their control, such as incomplete or inaccurate data provided by me or distortions of diagnostic images or specimens that may result from electronic transmissions. I understand that the practice of medicine is not an exact science and that Practitioner makes no warranties or guarantees regarding treatment outcomes. I acknowledge that I will receive a copy of this consent concurrently upon execution via email to the email address I last provided but may also request a printed copy by calling the office of [place].    I understand that my insurance will be billed for this visit.   I have read or had this consent read to me. . I understand the contents of this  consent, which adequately explains the benefits and risks of the Services being provided via telemedicine.  . I have been provided ample opportunity to ask questions regarding this consent and the Services and have had my questions answered to my satisfaction. . I give my informed consent for the services to be provided through the use of telemedicine in my medical care  By participating in this telemedicine visit I agree to the above.

## 2018-10-06 NOTE — Addendum Note (Signed)
Addended by: Annita Brod on: 10/06/2018 04:55 PM   Modules accepted: Orders

## 2018-10-06 NOTE — Progress Notes (Signed)
Virtual Visit via Telephone Note   This visit type was conducted due to national recommendations for restrictions regarding the COVID-19 Pandemic (e.g. social distancing) in an effort to limit this patient's exposure and mitigate transmission in our community.  Due to his co-morbid illnesses, this patient is at least at moderate risk for complications without adequate follow up.  This format is felt to be most appropriate for this patient at this time.  The patient did not have access to video technology/had technical difficulties with video requiring transitioning to audio format only (telephone).  All issues noted in this document were discussed and addressed.  No physical exam could be performed with this format.  Please refer to the patient's chart for his  consent to telehealth for [facility].   Evaluation Performed:  Follow-up visit  Date:  10/06/2018   ID:  Adam Ray, DOB 04/06/1956, MRN [id number]  Patient Location: Home  Provider Location: Home  PCP:  [name], MD  Cardiologist: Dr. Quay Burow Electrophysiologist:  None   Chief Complaint: Post hospital follow-up for recent STEMI  History of Present Illness:    Adam Ray is a 63 y.o. male who presents via audio/video conferencing for a telehealth visit today.    Adam Ray is a 62 year old married Caucasian male father of 2 children, grandfather of 8 grandchildren who I saw him remotely for peripheral vascular valuation.  He was recently discharged from [facility] after having had a "STEMI" for an occluded circumflex coronary artery on 07/12/2018.  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  His risk factors include recently discontinue tobacco abuse having smoked the majority of his life and diabetes as well as hyperlipidemia.  He does have a family history for heart disease with a father and brother both of who had myocardial infarction's.  He never had an  MI in the past.  Dr. Claiborne Billings placed a synergy drug-eluting stent in his circumflex.  The LAD and RCA were free of significant disease.  He was discharged home on aspirin and Brilinta.  He has had no recurrent symptoms.  He did see Dr. Sharol Given back for post hospital follow-up and has a wound VAC in place.  The patient does not have symptoms concerning for COVID-19 infection (fever, chills, cough, or new shortness of breath).    Past Medical History:  Diagnosis Date  . Allergy   . Arthritis   . Bronchitis   . Cataract   . Chronic kidney disease   . Claudication [facility])    right foot ray resection  . Colon polyps    hyperplastic  . COPD (chronic obstructive pulmonary disease) ([place])   . Diabetes mellitus   . Genital warts   . Gout   . Hyperlipidemia   . Hypertension   . Osteomyelitis of third toe of right foot ([place])   . Pneumonia   . Status post amputation of toe of right foot ([place]) 09/24/2016   Past Surgical History:  Procedure Laterality Date  . AMPUTATION Right 01/31/2016   Procedure: Right 2nd Toe Amputation;  Surgeon: Newt Minion, MD;  Location: [place];  Service: Orthopedics;  Laterality: Right;  . AMPUTATION Right 07/08/2018   Procedure: RIGHT TRANSMETATARSAL AMPUTATION;  Surgeon: Newt Minion, MD;  Location: [place];  Service: Orthopedics;  Laterality: Right;  . CATARACT EXTRACTION     right eye  . COLONOSCOPY    . CORONARY STENT INTERVENTION N/A 07/12/2018  Procedure: CORONARY STENT INTERVENTION;  Surgeon: Troy Sine, MD;  Location: [place] [facility];  Service: Cardiovascular;  Laterality: N/A;  . CORONARY/GRAFT ACUTE MI REVASCULARIZATION N/A 07/12/2018   Procedure: Coronary/Graft Acute MI Revascularization;  Surgeon: Troy Sine, MD;  Location: [place] [facility];  Service: Cardiovascular;  Laterality: N/A;  . I&D EXTREMITY  04/11/2012   Procedure: IRRIGATION AND DEBRIDEMENT EXTREMITY;  Surgeon: Wylene Simmer, MD;  Location: [place];  Service: Orthopedics;  Laterality: Right;   . LEFT HEART CATH AND CORONARY ANGIOGRAPHY N/A 07/12/2018   Procedure: LEFT HEART CATH AND CORONARY ANGIOGRAPHY;  Surgeon: Troy Sine, MD;  Location: [place] [facility];  Service: Cardiovascular;  Laterality: N/A;  . Surgery left great toe    . Tear ducts bilateral eyes    . TRANSMETATARSAL AMPUTATION Right 07/08/2018     Current Meds  Medication Sig  . acetaminophen (TYLENOL) 500 MG tablet Take 500-1,500 mg by mouth every 6 (six) hours as needed (for pain.).  [name] aspirin EC 81 MG tablet Take 81 mg by mouth daily.  [name] atorvastatin (LIPITOR) 80 MG tablet Take 1 tablet (80 mg total) by mouth daily at 6 PM.  . furosemide (LASIX) 40 MG tablet Take 1 tablet (40 mg total) by mouth daily.  . insulin aspart (NOVOLOG) 100 UNIT/ML injection Inject 40 Units into the skin 3 (three) times daily before meals.  . Insulin Glargine (LANTUS SOLOSTAR) 100 UNIT/ML Solostar Pen Inject 40 Units into the skin 2 (two) times daily.  . metFORMIN (GLUCOPHAGE) 500 MG tablet Take 1,000 mg by mouth 2 (two) times daily.  . Multiple Vitamin (MULTIVITAMIN WITH MINERALS) TABS tablet Take 2 tablets by mouth 2 (two) times daily. COMPLETE MULTIVITAMIN  . nitroGLYCERIN (NITROSTAT) 0.4 MG SL tablet Place 1 tablet (0.4 mg total) under the tongue every 5 (five) minutes x 3 doses as needed for chest pain.     Allergies:   Patient has no known allergies.   Social History   Tobacco Use  . Smoking status: Current Every Day Smoker    Packs/day: 0.30    Years: 48.00    Pack years: 14.40    Types: Cigars, Cigarettes    Start date: 07/02/1963  . Smokeless tobacco: Former Systems developer  . Tobacco comment: wants to use electric cigarettes.  not interested in pills, worried about chantix side effects  Substance Use Topics  . Alcohol use: Yes    Alcohol/week: 0.0 standard drinks    Comment: occassional use  . Drug use: No     Family Hx: The patient's family history includes Diabetes in his mother; Heart failure in his father; Stroke in  his mother. There is no history of Colon cancer, Esophageal cancer, Rectal cancer, or Stomach cancer.  ROS:   Please see the history of present illness.     All other systems reviewed and are negative.   Prior CV studies:   The following studies were reviewed today:    Labs/Other Tests and Data Reviewed:    EKG:  An ECG dated 07/14/2018 was personally reviewed today and demonstrated:  Normal sinus rhythm at 62 with septal Q waves, left axis deviation, LVH voltage and inferior T wave inversion.  Recent Labs: 07/12/2018: ALT 24; TSH 1.131 07/13/2018: Hemoglobin 12.6; Platelets 131 07/14/2018: BUN 20; Creatinine, Ser 1.24; Potassium 4.0; Sodium 138   Recent Lipid Panel Lab Results  Component Value Date/Time   CHOL 123 07/13/2018 12:57 AM   CHOL 159 10/21/2017 09:02 AM  TRIG 235 (H) 07/13/2018 12:57 AM   HDL 47 07/13/2018 12:57 AM   HDL 50 10/21/2017 09:02 AM   CHOLHDL 2.6 07/13/2018 12:57 AM   LDLCALC 29 07/13/2018 12:57 AM   LDLCALC 65 10/21/2017 09:02 AM   LDLDIRECT 78 09/11/2011 03:03 PM    Wt Readings from Last 3 Encounters:  08/31/18 180 lb 3.2 oz (81.7 kg)  08/27/18 170 lb (77.1 kg)  08/20/18 170 lb (77.1 kg)     Objective:    Vital Signs:  There were no vitals taken for this visit.   This was a telephone telemedicine visit and therefore physical exam was not performed.  ASSESSMENT & PLAN:    1. Coronary artery disease- history of CAD status post recent circumflex STEMI 07/12/2018 treated with PCI and drug-eluting stenting using a synergy drug-eluting stent by Dr. Claiborne Billings.  His EF was normal by 2D echo.  He had no other residual CAD.  He was discharged home on aspirin and Brilinta and is currently asymptomatic. 2. Tobacco abuse- lifelong history of tobacco abuse having recently discontinued at the time of his STEMI 3. Hyperlipidemia- history of hyperlipidemia on high-dose atorvastatin with lipid profile performed 07/13/2018 revealing a total cholesterol 123, LDL of 29  and HDL 47 4. Peripheral arterial disease- history of PAD status post recent right transmetatarsal amputation by Dr. Sharol Given for poorly healing osteomyelitis. 5.   COVID-19 Education: The signs and symptoms of COVID-19 were discussed with the patient and how to seek care for testing (follow up with PCP or arrange E-visit).  The importance of social distancing was discussed today.  Time:   Today, I have spent 20 minutes with the patient with telehealth technology discussing the above problems.     Medication Adjustments/Labs and Tests Ordered: Current medicines are reviewed at length with the patient today.  Concerns regarding medicines are outlined above.  Tests Ordered: No orders of the defined types were placed in this encounter.  Medication Changes: No orders of the defined types were placed in this encounter.   Disposition:  Follow up in 6 month(s)  Signed, Quay Burow, MD  10/06/2018 3:23 PM    [facility]

## 2018-10-06 NOTE — Patient Instructions (Addendum)
Medication Instructions:  Your physician recommends that you continue on your current medications as directed. Please refer to the Current Medication list given to you today.  Continue taking your ticagrelor (Brilinta) Continue taking your metoprolol (Lopressor)  If you need a refill on your cardiac medications before your next appointment, please call your pharmacy.   Lab work: NONE If you have labs (blood work) drawn today and your tests are completely normal, you will receive your results only by: [name] MyChart Message (if you have MyChart) OR . A paper copy in the mail If you have any lab test that is abnormal or we need to change your treatment, we will call you to review the results.  Testing/Procedures: NONE  Follow-Up: At [facility], you and your health needs are our priority.  As part of our continuing mission to provide you with exceptional heart care, we have created designated Provider Care Teams.  These Care Teams include your primary Cardiologist (physician) and Advanced Practice Providers (APPs -  Physician Assistants and Nurse Practitioners) who all work together to provide you with the care you need, when you need it. You will need a follow up appointment in [place]

## 2018-10-06 NOTE — Telephone Encounter (Signed)
LEFT DETAILED VOICEMAIL REGARDING AVS INSTRUCTIONS

## 2018-10-07 ENCOUNTER — Other Ambulatory Visit: Payer: Self-pay

## 2018-10-07 ENCOUNTER — Telehealth: Payer: Self-pay

## 2018-10-07 MED ORDER — TICAGRELOR 90 MG PO TABS: 90.0000 mg | ORAL_TABLET | Freq: Two times a day (BID) | ORAL | 11 refills | Status: DC

## 2018-10-08 ENCOUNTER — Telehealth: Payer: Self-pay | Admitting: Cardiovascular Disease

## 2018-10-08 NOTE — Telephone Encounter (Signed)
Faxed notes from Virtual visit appt on 10/06/18 to [place] from the [place]  Fax number  [phone]

## 2018-10-08 NOTE — Telephone Encounter (Signed)
New Message   Verdis Frederickson from the [place]  Would like the notes from Virtual visit appt on 10/06/18 to be sent to them  Fax number  [phone]

## 2018-10-15 ENCOUNTER — Telehealth: Payer: Self-pay | Admitting: Cardiovascular Disease

## 2018-10-15 DIAGNOSIS — R6889 Other general symptoms and signs: Secondary | ICD-10-CM | POA: Diagnosis not present

## 2018-10-15 NOTE — Telephone Encounter (Signed)
Pt c/o swelling: STAT is pt has developed SOB within 24 hours  1) How much weight have you gained and in what time span?  He have not weighed lately  2) If swelling, where is the swelling located? Feet and legs- pt wants to be seen- not a Virtual Visit  3) Are you currently taking a fluid pill? yes  4) Are you currently SOB? no  5) Do you have a log of your daily weights (if so, list)? no  6) Have you gained 3 pounds in a day or 5 pounds in a week?  He does not know  7) Have you traveled recently?  no

## 2018-10-16 NOTE — Telephone Encounter (Signed)
Called patient back, LMTCB

## 2018-10-16 NOTE — Telephone Encounter (Signed)
LMTCB

## 2018-10-16 NOTE — Telephone Encounter (Signed)
Follow Up:     Returning Ann's call from this morning.

## 2018-10-18 ENCOUNTER — Ambulatory Visit (INDEPENDENT_AMBULATORY_CARE_PROVIDER_SITE_OTHER)
Admission: EM | Admit: 2018-10-18 | Discharge: 2018-10-18 | Disposition: A | Discharge status: Home / Self Care | Payer: Medicare HMO | Source: Home / Self Care

## 2018-10-18 ENCOUNTER — Other Ambulatory Visit: Payer: Self-pay

## 2018-10-18 ENCOUNTER — Emergency Department (HOSPITAL_COMMUNITY): Payer: Medicare HMO

## 2018-10-18 ENCOUNTER — Encounter (HOSPITAL_COMMUNITY): Payer: Self-pay | Admitting: Emergency Medicine

## 2018-10-18 ENCOUNTER — Emergency Department (HOSPITAL_COMMUNITY)
Admission: EM | Admit: 2018-10-18 | Discharge: 2018-10-18 | Disposition: A | Discharge status: Home / Self Care | Payer: Medicare HMO | Attending: Emergency Medicine | Admitting: Emergency Medicine

## 2018-10-18 DIAGNOSIS — I10 Essential (primary) hypertension: Secondary | ICD-10-CM | POA: Insufficient documentation

## 2018-10-18 DIAGNOSIS — Z794 Long term (current) use of insulin: Secondary | ICD-10-CM | POA: Insufficient documentation

## 2018-10-18 DIAGNOSIS — H538 Other visual disturbances: Secondary | ICD-10-CM | POA: Diagnosis not present

## 2018-10-18 DIAGNOSIS — Z79899 Other long term (current) drug therapy: Secondary | ICD-10-CM | POA: Insufficient documentation

## 2018-10-18 DIAGNOSIS — H5462 Unqualified visual loss, left eye, normal vision right eye: Secondary | ICD-10-CM | POA: Insufficient documentation

## 2018-10-18 DIAGNOSIS — E1142 Type 2 diabetes mellitus with diabetic polyneuropathy: Secondary | ICD-10-CM | POA: Insufficient documentation

## 2018-10-18 DIAGNOSIS — J449 Chronic obstructive pulmonary disease, unspecified: Secondary | ICD-10-CM | POA: Insufficient documentation

## 2018-10-18 DIAGNOSIS — H539 Unspecified visual disturbance: Secondary | ICD-10-CM | POA: Diagnosis present

## 2018-10-18 LAB — LIPID PANEL
Cholesterol: 112 mg/dL (ref 0–200)
HDL: 46 mg/dL (ref >40)
Total CHOL/HDL Ratio: 2.4 RATIO
Triglycerides: 220 mg/dL — ABNORMAL HIGH (ref <150)
VLDL: 44 mg/dL — ABNORMAL HIGH (ref 0–40)

## 2018-10-18 LAB — CBC
HCT: 37.4 % — ABNORMAL LOW (ref 39.0–52.0)
Hemoglobin: 11.7 g/dL — ABNORMAL LOW (ref 13.0–17.0)
MCH: 28.7 pg (ref 26.0–34.0)
MCHC: 31.3 g/dL (ref 30.0–36.0)
MCV: 91.7 fL (ref 80.0–100.0)
Platelets: 139 10*3/uL — ABNORMAL LOW (ref 150–400)
RBC: 4.08 MIL/uL — ABNORMAL LOW (ref 4.22–5.81)
RDW: 14.9 % (ref 11.5–15.5)
WBC: 3.2 10*3/uL — ABNORMAL LOW (ref 4.0–10.5)
nRBC: 0 % (ref 0.0–0.2)

## 2018-10-18 LAB — COMPREHENSIVE METABOLIC PANEL
ALT: 21 U/L (ref 0–44)
AST: 26 U/L (ref 15–41)
Albumin: 3.5 g/dL (ref 3.5–5.0)
Alkaline Phosphatase: 82 U/L (ref 38–126)
Anion gap: 8 (ref 5–15)
BUN: 12 mg/dL (ref 8–23)
CO2: 27 mmol/L (ref 22–32)
Calcium: 9.2 mg/dL (ref 8.9–10.3)
Chloride: 105 mmol/L (ref 98–111)
Creatinine, Ser: 1.18 mg/dL (ref 0.61–1.24)
GFR calc Af Amer: >60 mL/min (ref >60)
GFR calc non Af Amer: >60 mL/min (ref >60)
Glucose, Bld: 68 mg/dL — ABNORMAL LOW (ref 70–99)
Potassium: 4.3 mmol/L (ref 3.5–5.1)
Sodium: 140 mmol/L (ref 135–145)
Total Bilirubin: 0.6 mg/dL (ref 0.3–1.2)
Total Protein: 6.2 g/dL — ABNORMAL LOW (ref 6.5–8.1)

## 2018-10-18 LAB — PROTIME-INR
INR: 1 (ref 0.8–1.2)
Prothrombin Time: 13.5 seconds (ref 11.4–15.2)

## 2018-10-18 LAB — HEMOGLOBIN A1C
Hgb A1c MFr Bld: 6.9 % — ABNORMAL HIGH (ref 4.8–5.6)
Mean Plasma Glucose: 151.33 mg/dL

## 2018-10-18 LAB — CBG MONITORING, ED: Glucose-Capillary: 87 mg/dL (ref 70–99)

## 2018-10-18 MED ORDER — FLUORESCEIN SODIUM 1 MG OP STRP
1.0000 | ORAL_STRIP | Freq: Once | OPHTHALMIC | Status: AC
  Administered 2018-10-18: 1 via OPHTHALMIC
  Filled 2018-10-18: qty 1

## 2018-10-18 MED ORDER — TETRACAINE HCL 0.5 % OP SOLN
2.0000 [drp] | Freq: Once | OPHTHALMIC | Status: AC
  Administered 2018-10-18: 2 [drp] via OPHTHALMIC
  Filled 2018-10-18: qty 4

## 2018-10-18 NOTE — ED Provider Notes (Signed)
[facility] Provider Note   CSN: [id number] Arrival date & time: 10/18/18  1434    History   Chief Complaint Chief Complaint  Patient presents with  . Eye Problem    HPI [facility] Ray is a 63 y.o. male.     HPI 63 year old man with a past medical history of diabetes, coronary artery disease currently on Brilinta and aspirin after a myocardial infarction earlier this year presents to the emergency [facility] for evaluation of left eye visual deficits ongoing for the last 2 weeks.  Insidious in onset and he does not recall any particular inciting event.  Has problems with his right eye though he does not know a particular diagnosis of his vision loss in his right eye that is chronic for the last several years.  He does not take any medicated eyedrops and he only uses Refresh Tears occasionally as needed.  He takes insulin for his diabetes and blood pressure medication as well as the previous mentioned antiplatelet medication but does not take other medication.  No recent trauma to the head.  No other neurologic deficits that the patient is aware of.  States that he is having trouble seeing out of the left eye which is worse than its baseline.  He generally needs glasses to see with his left eye.  Was seen at urgent care and sent here for possible ophthalmologic evaluation. Past Medical History:  Diagnosis Date  . Allergy   . Arthritis   . Bronchitis   . Cataract   . Chronic kidney disease   . Claudication [facility])    right foot ray resection  . Colon polyps    hyperplastic  . COPD (chronic obstructive pulmonary disease) ([place])   . Diabetes mellitus   . Genital warts   . Gout   . Hyperlipidemia   . Hypertension   . Osteomyelitis of third toe of right foot ([name])   . Pneumonia   . Status post amputation of toe of right foot ([place]) 09/24/2016    Patient Active Problem List   Diagnosis Date Noted  . STEMI (ST elevation myocardial infarction)  ([place]) 07/12/2018  . STEMI involving left circumflex coronary artery ([place]) 07/12/2018  . Acute ST elevation myocardial infarction (STEMI) ([place])   . Status post transmetatarsal amputation of foot, right ([place]) 07/08/2018  . Limited mobility 05/12/2018  . Gingival foreign body 07/22/2017  . Anxiety state 06/02/2017  . Coated tongue 04/03/2017  . Leg swelling 01/29/2017  . Idiopathic chronic venous hypertension of both lower extremities with inflammation 09/24/2016  . Onychomycosis 09/24/2016  . Testicular mass 04/18/2016  . Thrush 04/18/2016  . Soft tissue swelling   . Osteomyelitis ([place]) 01/29/2016  . Diabetic polyneuropathy associated with type 2 diabetes mellitus ([place])   . Type 2 diabetes mellitus with neurologic complication, with long-term current use of insulin ([place]) 12/08/2015  . Left shoulder pain 08/10/2015  . Pulmonary nodule 02/01/2015  . Screening for colon cancer 01/09/2015  . Lumbar back pain 03/21/2014  . Screening for STD (sexually transmitted disease) 12/15/2013  . Depression 12/09/2013  . Lightheadedness 11/19/2013  . Warts, genital 08/20/2013  . 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 Right leg pain 04/27/2013  . Diabetic retinopathy ([place]) 01/28/2013  . Visit for well man health check 11/19/2010  . Sleep apnea 09/06/2010  . 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 LEG EDEMA 03/03/2009  . Essential hypertension 11/09/2008  . COPD, mild ([place]) 10/06/2006  . SICKLE-CELL TRAIT 04/26/2006  .  ERECTILE DYSFUNCTION 04/26/2006  . Tobacco abuse 04/26/2006  . PAD (peripheral artery disease) ([place]) 04/26/2006  . ALLERGIC RHINITIS 04/26/2006    Past Surgical History:  Procedure Laterality Date  . AMPUTATION Right 01/31/2016   Procedure: Right 2nd Toe Amputation;  Surgeon: Newt Minion, MD;  Location: [place];  Service: Orthopedics;  Laterality: Right;  . AMPUTATION Right 07/08/2018   Procedure: RIGHT TRANSMETATARSAL AMPUTATION;  Surgeon: Newt Minion, MD;  Location: [place];  Service: Orthopedics;  Laterality: Right;  . CATARACT EXTRACTION     right eye  . COLONOSCOPY    . CORONARY STENT INTERVENTION N/A 07/12/2018   Procedure: CORONARY STENT INTERVENTION;  Surgeon: Troy Sine, MD;  Location: [place] [facility];  Service: Cardiovascular;  Laterality: N/A;  . CORONARY/GRAFT ACUTE MI REVASCULARIZATION N/A 07/12/2018   Procedure: Coronary/Graft Acute MI Revascularization;  Surgeon: Troy Sine, MD;  Location: [place] [facility];  Service: Cardiovascular;  Laterality: N/A;  . I&D EXTREMITY  04/11/2012   Procedure: IRRIGATION AND DEBRIDEMENT EXTREMITY;  Surgeon: Wylene Simmer, MD;  Location: [place];  Service: Orthopedics;  Laterality: Right;  . LEFT HEART CATH AND CORONARY ANGIOGRAPHY N/A 07/12/2018   Procedure: LEFT HEART CATH AND CORONARY ANGIOGRAPHY;  Surgeon: Troy Sine, MD;  Location: [place] [facility];  Service: Cardiovascular;  Laterality: N/A;  . Surgery left great toe    . Tear ducts bilateral eyes    . TRANSMETATARSAL AMPUTATION Right 07/08/2018        Home Medications    Prior to Admission medications   Medication Sig Start Date End Date Taking? Authorizing Provider  acetaminophen (TYLENOL) 500 MG tablet Take 500-1,500 mg by mouth every 6 (six) hours as needed (for pain.).    [provider]  aspirin EC 81 MG tablet Take 81 mg by mouth daily.    [provider]  atorvastatin (LIPITOR) 80 MG tablet Take 1 tablet (80 mg total) by mouth daily at 6 PM. 07/15/18   Tommie Raymond, NP  furosemide (LASIX) 40 MG tablet Take 1 tablet (40 mg total) by mouth daily. 09/02/18   [name], MD  insulin aspart (NOVOLOG) 100 UNIT/ML injection Inject 40 Units into the skin 3 (three) times daily before meals.    [provider]  Insulin Glargine (LANTUS SOLOSTAR) 100 UNIT/ML Solostar Pen Inject 40 Units into the skin 2 (two) times daily.    [provider]  latanoprost (XALATAN) 0.005 %  ophthalmic solution Place 1 drop into both eyes at bedtime.    [provider]  metFORMIN (GLUCOPHAGE) 500 MG tablet Take 1,000 mg by mouth 2 (two) times daily.    [provider]  metoprolol tartrate (LOPRESSOR) 25 MG tablet Take 0.5 tablets (12.5 mg total) by mouth 2 (two) times daily. 10/06/18   Lorretta Harp, MD  Multiple Vitamin (MULTIVITAMIN WITH MINERALS) TABS tablet Take 2 tablets by mouth 2 (two) times daily. COMPLETE MULTIVITAMIN    [provider]  nitroGLYCERIN (NITROSTAT) 0.4 MG SL tablet Place 1 tablet (0.4 mg total) under the tongue every 5 (five) minutes x 3 doses as needed for chest pain. 07/15/18   Kathyrn Drown D, NP  ticagrelor (BRILINTA) 90 MG TABS tablet Take 1 tablet (90 mg total) by mouth 2 (two) times daily. 10/07/18   Lorretta Harp, MD  levofloxacin (LEVAQUIN) 750 MG tablet Take 1 tablet (750 mg total) by mouth daily. For 14 days 07/07/12 07/24/12  Verdie Drown, Samuel Germany,  MD    Family History Family History  Problem Relation Age of Onset  . Diabetes Mother   . Stroke Mother   . Heart failure Father   . Colon cancer Neg Hx   . Esophageal cancer Neg Hx   . Rectal cancer Neg Hx   . Stomach cancer Neg Hx     Social History Social History   Tobacco Use  . Smoking status: Current Every Day Smoker    Packs/day: 0.30    Years: 48.00    Pack years: 14.40    Types: Cigars, Cigarettes    Start date: 07/02/1963  . Smokeless tobacco: Former Systems developer  . Tobacco comment: wants to use electric cigarettes.  not interested in pills, worried about chantix side effects  Substance Use Topics  . Alcohol use: Yes    Alcohol/week: 0.0 standard drinks    Comment: occassional use  . Drug use: No     Allergies   Patient has no known allergies.   Review of Systems Review of Systems  Constitutional: Negative for activity change, appetite change, chills and fever.  HENT: Negative for ear pain and sore throat.   Eyes: Positive for visual disturbance.  Negative for photophobia, pain, redness and itching.  Respiratory: Negative for cough and shortness of breath.   Cardiovascular: Negative for chest pain and palpitations.  Gastrointestinal: Negative for abdominal pain and vomiting.  Endocrine: Negative for polyphagia and polyuria.  Genitourinary: Negative for dysuria and hematuria.  Musculoskeletal: Negative for arthralgias and back pain.  Skin: Negative for color change and rash.  Allergic/Immunologic: Negative for immunocompromised state.  Neurological: Negative for seizures, syncope, weakness, light-headedness and headaches.  All other systems reviewed and are negative.    Physical Exam Updated Vital Signs There were no vitals taken for this visit.  Physical Exam Vitals signs and nursing note reviewed.  Constitutional:      Appearance: He is well-developed.  HENT:     Head: Normocephalic and atraumatic.  Eyes:     Conjunctiva/sclera: Conjunctivae normal.  Neck:     Musculoskeletal: Neck supple.  Cardiovascular:     Rate and Rhythm: Normal rate and regular rhythm.     Heart sounds: No murmur.  Pulmonary:     Effort: Pulmonary effort is normal. No respiratory distress.     Breath sounds: Normal breath sounds.  Abdominal:     Palpations: Abdomen is soft.     Tenderness: There is no abdominal tenderness.  Musculoskeletal:        General: No deformity.     Right lower leg: No edema.     Left lower leg: No edema.  Skin:    General: Skin is warm and dry.  Neurological:     Mental Status: He is alert and oriented to person, place, and time.     Sensory: No sensory deficit.     Motor: No weakness.     Comments: 5 out of 5 strength in bilateral upper and lower extremities.  Cranial nerves III through XII grossly intact bilaterally.  Notable left visual field bilateral hemianopsia in the upper and lower quadrants which appears to be new from his baseline he states.  Less than 20/200 vision in the right eye and exactly 20/200  vision in the left eye.  Bilateral eye pressure 20 mmHg.  No fluorescein uptake on fluorescein staining exam.  Ultrasound does not reveal any hyperechoic material within the globe and it otherwise appears normal.  Psychiatric:  Mood and Affect: Mood normal.      ED Treatments / Results  Labs (all labs ordered are listed, but only abnormal results are displayed) Labs Reviewed - No data to display  EKG None  Radiology No results found.  Procedures Procedures (including critical care time)  Medications Ordered in ED Medications - No data to display   Initial Impression / Assessment and Plan / ED Course  I have reviewed the triage vital signs and the nursing notes.  Pertinent labs & imaging results that were available during my care of the patient were reviewed by me and considered in my medical decision making (see chart for details).         Well-appearing hemodynamically stable 63 year old man presents to the emergency [facility] for 2 weeks of vision loss.  Patient has a history concerning for possible stroke given his past medical history and the otherwise normal physical examination findings except for hemianopsia as noted in the note above.  Basic lab work appears to be at the patient's baseline.  CT scan does not show any acute intracranial hemorrhage.  MRI ordered for possible stroke and no identifiable stroke was found.    Of note at approximately 6 PM I was called into the patient's room as he desired to leave.  I explained to his wife over the phone as well that we felt it was necessary for him to stay to complete the evaluation for possible stroke given his concerning hemianopsia.  He decided to stay for the MRI at that time.  Given the patient's negative work-up at this time I do believe that this is an ophthalmologic problem but does not appear to be an ophthalmologic emergency and is stable for follow-up this week for ophthalmology.  Patient in agreement with  the plan.  I spoke with the patient at length about not driving because of his vision impairment.  Patient stated that he understood and he has family in the area who may be able to come pick him up.  Patient discharged in good condition and told return to the emergency [facility] if any worrisome signs or symptoms appear. Final Clinical Impressions(s) / ED Diagnoses   Final diagnoses:  Vision loss of left eye    ED Discharge Orders    None       Andee Poles, MD 10/18/18 2206    Elnora Morrison, MD 10/20/18 [phone]

## 2018-10-18 NOTE — ED Triage Notes (Signed)
Pt here for blurred vision sts seen at [facility] for same 2 weeks ago; pt denies any change

## 2018-10-18 NOTE — Discharge Instructions (Signed)
Please go to emergency room for further evaluation.

## 2018-10-18 NOTE — ED Notes (Signed)
Pt ambulated to bathroom 

## 2018-10-18 NOTE — Discharge Instructions (Addendum)
You will be called to set up a follow-up appointment with the eye doctor.  Please do not drive until you are able to have your vision evaluated.

## 2018-10-18 NOTE — ED Notes (Signed)
Patient verbalizes understanding of discharge instructions. Opportunity for questioning and answers were provided. Armband removed by staff, pt discharged from ED.  

## 2018-10-18 NOTE — ED Notes (Signed)
Patient transported to MRI 

## 2018-10-18 NOTE — ED Triage Notes (Signed)
Pt seen at [facility] for blurred vision in left eye that has been progressive for weeks. Pt seen several times for same. Sent her for further evaluation

## 2018-10-18 NOTE — ED Provider Notes (Addendum)
[place]    CSN: [id number] Arrival date & time: 10/18/18  1321     History   Chief Complaint Chief Complaint  Patient presents with   Blurred Vision    HPI Adam Ray is a 63 y.o. male history of hypertension, hyperlipidemia, DM type II, COPD, CKD, CAD, presenting today for evaluation of blurry vision.  Patient states that he has had progressive blurry vision in his left eye over the past 2 weeks.  States that everything has felt foggy.  He also notes that he feels as if he has bleeding in his eye as he frequently will see red in his vision, denies any drainage or seeing red on his eye in a mirror.  States that it is mainly over his visual field.  He denies any spots or flashes.  He does note that he has some pain and feels as if there is a pin tapping on the back of his eyeball and then will slowly release.  This will happen every 15 to 20 minutes.  He states he last had an eye exam in September.  He does note that he has prescription glasses for reading and driving and states that there for a distance.  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  He denies any drainage.  Denies weakness, difficulty speaking or facial drooping.  Denies headache.  Patient recently with STEMI and January 2020 and initiated on Brilinta and aspirin daily.  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    Patient Active Problem List   Diagnosis Date Noted   STEMI (ST elevation myocardial infarction) ([place]) 07/12/2018   STEMI involving  left circumflex coronary artery ([place]) 07/12/2018   Acute ST elevation myocardial infarction (STEMI) ([place])    Status post transmetatarsal amputation of foot, right ([place]) 07/08/2018   Limited mobility 05/12/2018   Gingival foreign body 07/22/2017   Anxiety state 06/02/2017   Coated tongue 04/03/2017   Leg swelling 01/29/2017   Idiopathic chronic venous hypertension of both lower extremities with inflammation 09/24/2016   Onychomycosis 09/24/2016   Testicular mass 04/18/2016   Thrush 04/18/2016   Soft tissue swelling    Osteomyelitis ([place]) 01/29/2016   Diabetic polyneuropathy associated with type 2 diabetes mellitus ([place])    Type 2 diabetes mellitus with neurologic complication, with long-term current use of insulin ([place]) 12/08/2015   Left shoulder pain 08/10/2015   Pulmonary nodule 02/01/2015   Screening for colon cancer 01/09/2015   Lumbar back pain 03/21/2014   Screening for STD (sexually transmitted disease) 12/15/2013   Depression 12/09/2013   Lightheadedness 11/19/2013   Warts, genital 08/20/2013   Moderate nonproliferative diabetic retinopathy(362.05) 05/26/2013   Right leg pain 04/27/2013   Diabetic retinopathy ([place]) 01/28/2013   Visit for well man health check 11/19/2010   Sleep apnea 09/06/2010   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   LEG EDEMA 03/03/2009  Essential hypertension 11/09/2008   COPD, mild ([place]) 10/06/2006   SICKLE-CELL TRAIT 04/26/2006   ERECTILE DYSFUNCTION 04/26/2006   Tobacco abuse 04/26/2006   PAD (peripheral artery disease) ([place]) 04/26/2006   ALLERGIC RHINITIS 04/26/2006    Past Surgical History:  Procedure Laterality Date   AMPUTATION Right 01/31/2016   Procedure: Right 2nd Toe Amputation;  Surgeon: Newt Minion, MD;  Location: [facility];  Service: Orthopedics;  Laterality: Right;   AMPUTATION Right 07/08/2018   Procedure: RIGHT TRANSMETATARSAL AMPUTATION;  Surgeon: Newt Minion, MD;  Location: [facility];  Service:  Orthopedics;  Laterality: Right;   CATARACT EXTRACTION     right eye   COLONOSCOPY     CORONARY STENT INTERVENTION N/A 07/12/2018   Procedure: CORONARY STENT INTERVENTION;  Surgeon: Troy Sine, MD;  Location: [facility];  Service: Cardiovascular;  Laterality: N/A;   CORONARY/GRAFT ACUTE MI REVASCULARIZATION N/A 07/12/2018   Procedure: Coronary/Graft Acute MI Revascularization;  Surgeon: Troy Sine, MD;  Location: [facility];  Service: Cardiovascular;  Laterality: N/A;   I&D EXTREMITY  04/11/2012   Procedure: IRRIGATION AND DEBRIDEMENT EXTREMITY;  Surgeon: Wylene Simmer, MD;  Location: [facility];  Service: Orthopedics;  Laterality: Right;   LEFT HEART CATH AND CORONARY ANGIOGRAPHY N/A 07/12/2018   Procedure: LEFT HEART CATH AND CORONARY ANGIOGRAPHY;  Surgeon: Troy Sine, MD;  Location: [facility];  Service: Cardiovascular;  Laterality: N/A;   Surgery left great toe     Tear ducts bilateral eyes     TRANSMETATARSAL AMPUTATION Right 07/08/2018       Home Medications    Prior to Admission medications   Medication Sig Start Date End Date Taking? Authorizing Provider  acetaminophen (TYLENOL) 500 MG tablet Take 500-1,500 mg by mouth every 6 (six) hours as needed (for pain.).    [provider]  aspirin EC 81 MG tablet Take 81 mg by mouth daily.    [provider]  atorvastatin (LIPITOR) 80 MG tablet Take 1 tablet (80 mg total) by mouth daily at 6 PM. 07/15/18   Tommie Raymond, NP  furosemide (LASIX) 40 MG tablet Take 1 tablet (40 mg total) by mouth daily. 09/02/18   [name], MD  insulin aspart (NOVOLOG) 100 UNIT/ML injection Inject 40 Units into the skin 3 (three) times daily before meals.    [provider]  Insulin Glargine (LANTUS SOLOSTAR) 100 UNIT/ML Solostar Pen Inject 40 Units into the skin 2 (two) times daily.    [provider]  latanoprost (XALATAN) 0.005 % ophthalmic solution Place 1 drop into  both eyes at bedtime.    [provider]  metFORMIN (GLUCOPHAGE) 500 MG tablet Take 1,000 mg by mouth 2 (two) times daily.    [provider]  metoprolol tartrate (LOPRESSOR) 25 MG tablet Take 0.5 tablets (12.5 mg total) by mouth 2 (two) times daily. 10/06/18   Lorretta Harp, MD  Multiple Vitamin (MULTIVITAMIN WITH MINERALS) TABS tablet Take 2 tablets by mouth 2 (two) times daily. COMPLETE MULTIVITAMIN    [provider]  nitroGLYCERIN (NITROSTAT) 0.4 MG SL tablet Place 1 tablet (0.4 mg total) under the tongue every 5 (five) minutes x 3 doses as needed for chest pain. 07/15/18   Kathyrn Drown D, NP  ticagrelor (BRILINTA) 90 MG TABS tablet Take 1 tablet (90 mg total) by mouth 2 (two) times daily. 10/07/18   Lorretta Harp, MD  levofloxacin (LEVAQUIN) 750 MG tablet Take 1 tablet (750  mg total) by mouth daily. For 14 days 07/07/12 07/24/12  Verdie Drown, Samuel Germany, MD    Family History Family History  Problem Relation Age of Onset   Diabetes Mother    Stroke Mother    Heart failure Father    Colon cancer Neg Hx    Esophageal cancer Neg Hx    Rectal cancer Neg Hx    Stomach cancer Neg Hx     Social History Social History   Tobacco Use   Smoking status: Current Every Day Smoker    Packs/day: 0.30    Years: 48.00    Pack years: 14.40    Types: Cigars, Cigarettes    Start date: 07/02/1963   Smokeless tobacco: Former Systems developer   Tobacco comment: wants to use electric cigarettes.  not interested in pills, worried about chantix side effects  Substance Use Topics   Alcohol use: Yes    Alcohol/week: 0.0 standard drinks    Comment: occassional use   Drug use: No     Allergies   Patient has no known allergies.   Review of Systems Review of Systems  Constitutional: Negative for fatigue and fever.  HENT: Negative for congestion, sinus pressure and sore throat.   Eyes: Positive for pain and visual disturbance. Negative for photophobia and discharge.   Respiratory: Negative for cough and shortness of breath.   Cardiovascular: Negative for chest pain.  Gastrointestinal: Negative for abdominal pain, nausea and vomiting.  Genitourinary: Negative for decreased urine volume and hematuria.  Musculoskeletal: Negative for myalgias, neck pain and neck stiffness.  Neurological: Negative for dizziness, syncope, facial asymmetry, speech difficulty, weakness, light-headedness, numbness and headaches.     Physical Exam Triage Vital Signs ED Triage Vitals  Enc Vitals Group     BP 10/18/18 1348 112/77     Pulse Rate 10/18/18 1348 62     Resp 10/18/18 1348 18     Temp 10/18/18 1348 98.1 F (36.7 C)     Temp Source 10/18/18 1348 Oral     SpO2 10/18/18 1348 99 %     Weight --      Height --      Head Circumference --      Peak Flow --      Pain Score 10/18/18 1349 4     Pain Loc --      Pain Edu? --      Excl. in [place]? --    No data found.  Updated Vital Signs BP 112/77 (BP Location: Right Arm)    Pulse 62    Temp 98.1 F (36.7 C) (Oral)    Resp 18    SpO2 99%   Visual Acuity Unable to read top line of visual acuity chart with either eye with up close and distance Right Eye Distance:   Left Eye Distance:   Bilateral Distance:    Right Eye Near:   Left Eye Near:    Bilateral Near:     Physical Exam Vitals signs and nursing note reviewed.  Constitutional:      Appearance: He is well-developed.  HENT:     Head: Normocephalic and atraumatic.  Eyes:     Extraocular Movements: Extraocular movements intact.     Conjunctiva/sclera: Conjunctivae normal.     Pupils: Pupils are equal, round, and reactive to light.     Comments: Has corneal arcus bilaterally, conjunctiva nonerythematous, limbus clear   Neck:     Musculoskeletal: Neck supple.  Cardiovascular:     Rate and  Rhythm: Normal rate and regular rhythm.     Heart sounds: No murmur.  Pulmonary:     Effort: Pulmonary effort is normal. No respiratory distress.  Skin:     General: Skin is warm and dry.  Neurological:     General: No focal deficit present.     Mental Status: He is alert and oriented to person, place, and time. Mental status is at baseline.     Cranial Nerves: No cranial nerve deficit.     Motor: No weakness.     Gait: Gait normal.      UC Treatments / Results  Labs (all labs ordered are listed, but only abnormal results are displayed) Labs Reviewed - No data to display  EKG None  Radiology No results found.  Procedures Procedures (including critical care time)  Medications Ordered in UC Medications - No data to display  Initial Impression / Assessment and Plan / UC Course  I have reviewed the triage vital signs and the nursing notes.  Pertinent labs & imaging results that were available during my care of the patient were reviewed by me and considered in my medical decision making (see chart for details).    Given patient's unilateral symptoms, progressive symptoms, reported red/bleeding in visiual field, benign/nonsuggestive external eye exam, poor visual acuity and significant history of diabetes and hypertension that could be contributing to symptoms recommending further evaluation in emergency room as likely needs further evaluation by ophthalmologist.  Cannot rule out more serious eye emergency at this time.  Discussed case with Dr. Joseph Art who is agreeable with plan to have further evaluation in emergency room.  Patient verbalized understanding and agrees to go to emergency room, stable upon discharge.  Final Clinical Impressions(s) / UC Diagnoses   Final diagnoses:  Blurry vision, left eye     Discharge Instructions     Please go to emergency room for further evaluation    ED Prescriptions    None     Controlled Substance Prescriptions [place] Controlled Substance Registry consulted? Not Applicable   Janith Lima, PA-C 10/18/18 1429    Torris House, [place] C, [place] 10/18/18 1431

## 2018-10-20 NOTE — Telephone Encounter (Signed)
Message received from Triage. Patient was recently seen by Dr Gwenlyn Found on 10/06/18 and was doing well at that time.   Tried to return call, no answer. Left a voicemail.  I will forward Triage's call to Dr. Gwenlyn Found.

## 2018-10-20 NOTE — Telephone Encounter (Signed)
Patient returned call back.

## 2018-10-20 NOTE — Telephone Encounter (Signed)
Make an appointment to see an APP back in the office in 3 months

## 2018-10-20 NOTE — Telephone Encounter (Signed)
Left message for patient to call if needed.

## 2018-10-20 NOTE — Telephone Encounter (Signed)
Called and spoke with wife and patient, they stated that he has had no changes in the past 5 days with the swelling in his ankles/leg. It is only in the right leg, which he had amputations on his toes on this leg, so only ankles and leg swelling is noted. Denies weight gain (hasnt been checking at home), denies redness, denies increased salt or diet changes, denies SOB, denies chest pains. They are elevating his legs, and he is wearing compression stockings. Patient currently takes lasix 60 mg daily, and when asked about spironolactone they stated he was not taking this, other than that no other med changes. Patient has not been seen in our office in quite a while, please advise if patient should be seen virtual visit and or office visit, or other recommendations.   Thanks!

## 2018-10-21 ENCOUNTER — Telehealth (HOSPITAL_COMMUNITY): Payer: Self-pay | Admitting: Cardiac Rehabilitation

## 2018-10-21 NOTE — Telephone Encounter (Addendum)
Phone call to patient to discuss outpatient cardiac rehab and make aware dept remains closed for COVID 19 precautions.  Left message on voice mail. Andi Hence, RN, BSN Cardiac Pulmonary Rehab

## 2018-10-22 DIAGNOSIS — Z794 Long term (current) use of insulin: Secondary | ICD-10-CM | POA: Diagnosis not present

## 2018-10-22 DIAGNOSIS — H4312 Vitreous hemorrhage, left eye: Secondary | ICD-10-CM | POA: Diagnosis not present

## 2018-10-22 DIAGNOSIS — E113513 Type 2 diabetes mellitus with proliferative diabetic retinopathy with macular edema, bilateral: Secondary | ICD-10-CM | POA: Diagnosis not present

## 2018-10-22 DIAGNOSIS — E1136 Type 2 diabetes mellitus with diabetic cataract: Secondary | ICD-10-CM | POA: Diagnosis not present

## 2018-10-22 DIAGNOSIS — Z9842 Cataract extraction status, left eye: Secondary | ICD-10-CM | POA: Diagnosis not present

## 2018-10-22 DIAGNOSIS — Z961 Presence of intraocular lens: Secondary | ICD-10-CM | POA: Diagnosis not present

## 2018-10-22 DIAGNOSIS — R6889 Other general symptoms and signs: Secondary | ICD-10-CM | POA: Diagnosis not present

## 2018-10-22 DIAGNOSIS — H35373 Puckering of macula, bilateral: Secondary | ICD-10-CM | POA: Diagnosis not present

## 2018-10-22 LAB — HM DIABETES EYE EXAM

## 2018-10-23 NOTE — Telephone Encounter (Signed)
Contacted the patient to let him know that I had made him an appointment with Kerin Ransom for June 30 at 2 PM.  Left a voicemail with this information and requested that he call back to the office to confirm that he got the message.  Rosaria Ferries, PA-C 10/23/2018 9:16 AM [facility] [phone]

## 2018-10-27 ENCOUNTER — Other Ambulatory Visit: Payer: Self-pay

## 2018-10-27 ENCOUNTER — Encounter: Payer: Self-pay | Admitting: Podiatry

## 2018-10-27 ENCOUNTER — Ambulatory Visit (INDEPENDENT_AMBULATORY_CARE_PROVIDER_SITE_OTHER): Payer: PRIVATE HEALTH INSURANCE | Admitting: Podiatry

## 2018-10-27 VITALS — Temp 98.2°F

## 2018-10-27 DIAGNOSIS — E1142 Type 2 diabetes mellitus with diabetic polyneuropathy: Secondary | ICD-10-CM | POA: Diagnosis not present

## 2018-10-27 DIAGNOSIS — M79675 Pain in left toe(s): Secondary | ICD-10-CM | POA: Diagnosis not present

## 2018-10-27 DIAGNOSIS — B351 Tinea unguium: Secondary | ICD-10-CM

## 2018-10-27 DIAGNOSIS — Z899 Acquired absence of limb, unspecified: Secondary | ICD-10-CM

## 2018-10-27 NOTE — Progress Notes (Signed)
This patient presents to the office for nail care on his toes left foot.   Patient presents to the office with his wife. Patient has also had surgery for the removal of his   right foot.   He presents to the office today for preventative foot care services for his left foot.  He says the nails are thick and long.  He is unable to self treat.  TMA right foot.  General Appearance  Alert, conversant and in no acute stress.  Vascular  Dorsalis pedis  are weakly  palpable  Left foot. Posterior tibial pulse is absent due to swelling.  .  Capillary return is within normal limits  left foot.. Temperature is within normal limits  left foot..  Neurologic  Senn-Weinstein monofilament wire test diminished left foot. Muscle power diminished..  Nails Thick disfigured discolored nails with subungual debris  from hallux to fifth toes left foot. No evidence of bacterial infection or drainage bilaterally. Unable to check toes right foot due to amputation.  Orthopedic  No limitations of motion  feet .  No crepitus or effusions noted.  HAV 1st MPJ left foot.  Hammer toes 2-5 left foot. Amputations TMA right foot  Skin  normotropic skin with no porokeratosis noted bilaterally.  No signs of infections or ulcers noted.    Onychomycosis  Left foot.  Hammer toes 2-5  Left foot.  HAV left foot.  Debride nails left foot.  RTC 3 months.  Gardiner Barefoot DPM

## 2018-10-27 NOTE — Telephone Encounter (Signed)
Pt aware that he has appt 12/29/2018 at 2:00pm and verbalized understanding

## 2018-11-24 ENCOUNTER — Telehealth: Payer: Self-pay | Admitting: Cardiovascular Disease

## 2018-11-24 NOTE — Telephone Encounter (Signed)
New Message   Pt c/o of Chest Pain: STAT if CP now or developed within 24 hours  1. Are you having CP right now? No tightness in chest area  2. Are you experiencing any other symptoms (ex. SOB, nausea, vomiting, sweating)? SOB  3. How long have you been experiencing CP? A week ago  4. Is your CP continuous or coming and going? Coming and going   5. Have you taken Nitroglycerin? NO  ?

## 2018-11-24 NOTE — Telephone Encounter (Signed)
Left message to call back  

## 2018-11-25 ENCOUNTER — Ambulatory Visit: Payer: Self-pay | Admitting: Orthopedic Surgery

## 2018-11-25 NOTE — Telephone Encounter (Signed)
[name] is having chest pain and shortness of breath he probably should go to the emergency room.

## 2018-11-25 NOTE — Telephone Encounter (Signed)
Spoke with pt and pt handed phone to male at his residence and spoke with male ,as well. Pt has noted significant swelling to legs,SOB, and chest tightness every day Per pt has not tried ntg and refuses to is demanding a office visit Encouraged pt to go to ED for evaluation refuses.Will forward to Dr Gwenlyn Found for review .Adonis Housekeeper

## 2018-11-26 NOTE — Telephone Encounter (Signed)
Contacted pt to f/u on if he went to ED. lmtcb

## 2018-11-28 ENCOUNTER — Telehealth: Payer: Self-pay

## 2018-11-28 NOTE — Telephone Encounter (Signed)
Pt states that he was not seen by orthopedics on 11/25/18. No ortho notes noted. Covid testing not warrented.

## 2018-12-18 ENCOUNTER — Ambulatory Visit (INDEPENDENT_AMBULATORY_CARE_PROVIDER_SITE_OTHER): Payer: No Typology Code available for payment source | Admitting: Physician Assistant

## 2018-12-18 ENCOUNTER — Encounter: Payer: Self-pay | Admitting: Physician Assistant

## 2018-12-18 ENCOUNTER — Other Ambulatory Visit: Payer: Self-pay

## 2018-12-18 VITALS — Ht 70.0 in | Wt 180.0 lb

## 2018-12-18 DIAGNOSIS — E1142 Type 2 diabetes mellitus with diabetic polyneuropathy: Secondary | ICD-10-CM

## 2018-12-18 DIAGNOSIS — I739 Peripheral vascular disease, unspecified: Secondary | ICD-10-CM | POA: Diagnosis not present

## 2018-12-18 DIAGNOSIS — I87323 Chronic venous hypertension (idiopathic) with inflammation of bilateral lower extremity: Secondary | ICD-10-CM

## 2018-12-18 DIAGNOSIS — Z89431 Acquired absence of right foot: Secondary | ICD-10-CM

## 2018-12-18 MED ORDER — DOXYCYCLINE HYCLATE 100 MG PO CAPS: 100.0000 mg | ORAL_CAPSULE | Freq: Two times a day (BID) | ORAL | 1 refills | Status: DC

## 2018-12-20 ENCOUNTER — Encounter: Payer: Self-pay | Admitting: Physician Assistant

## 2018-12-20 NOTE — Progress Notes (Signed)
Office Visit Note   Patient: Adam Ray           Date of Birth: Nov 05, 1955           MRN: [id number] Visit Date: 12/18/2018              Requested by: [facility] [address] [facility] [place],  [place] [address] PCP: [facility]  Chief Complaint  Patient presents with  . Right Foot - Follow-up    07/2018 transmet amputation   . Right Leg - Edema  . Left Leg - Edema      HPI: The patient is a 63 year old gentleman with a history of a right transmetatarsal amputation in January 2020 who presents with a small ulceration over the distal residual right transmetatarsal amputation.  He also presents with severe bilateral lower extremity swelling the right side greater than the left.  He reports that he had a heart attack back in February and ever since then has developed some progressive swelling of the lower extremities.  He was started on Lasix 60 mg daily but has not been seen in the office by the cardiologist due to the COVID-19 situation.  He does have a low-grade temp of 99.8 today but reports he is otherwise feeling in his usual state of health.  He denies any upper respiratory symptoms.  He does have medical compression stockings but has been unable to apply the medical compression stocking to the right lower extremity due to the degree of his swelling.  He is planning to see his cardiologist in the office for further evaluation of his lower extremity swelling but comes in today with concerns of the swelling in small residual wound over the distal amputation.  Assessment & Plan: Visit Diagnoses:  1. Status post transmetatarsal amputation of foot, right ([place])   2. Diabetic polyneuropathy associated with type 2 diabetes mellitus ([place])   3. Idiopathic chronic venous hypertension of both lower extremities with inflammation   4. Peripheral vascular disease ([place])     Plan: A Dynaflex compression wrap was applied to the right lower extremity today with  a small amount of silver cell over the small distal ulcer.  He is also started on doxycycline 100 mg p.o. twice daily.  He will follow-up on Monday for reevaluation.  He was instructed to start utilizing a medical compression sock on the left side as well and a prescription was written for the patient for medical compression socks.  He was instructed to elevate and offload his legs is much as possible.  Again he will follow-up on Monday for dressing change.  Follow-Up Instructions: Return in about 3 days (around 12/21/2018).   Ortho Exam  Patient is alert, oriented, no adenopathy, well-dressed, normal affect, normal respiratory effort. The patient has significant bilateral lower extremity edema.  The right side calf is 46 cm and the left side is 40 cm.  He is not currently in compression on either leg today.  He reports he does have medical compression stockings at home but has not been wearing them and was given a prescription for Vive medical compression socks today.  Pulses were dopplered in the right right foot and were biphasic.  He has a small 1 cm ulceration over the distal residual amputation site without signs of cellulitis or infection of the foot.  He was placed in a multi layer compression wrap on the right lower extremity and will follow-up on Monday for recheck.  Imaging: No results found.  Labs: Lab Results  Component Value Date   HGBA1C 6.9 (H) 10/18/2018   HGBA1C 7.0 (H) 07/12/2018   HGBA1C 11.1 10/21/2017   ESRSEDRATE 8 01/29/2016   ESRSEDRATE 9 04/11/2012   CRP 2.6 (H) 01/29/2016   CRP 0.5 02/28/2014   REPTSTATUS 02/03/2016 FINAL 01/29/2016   REPTSTATUS 02/03/2016 FINAL 01/29/2016   GRAMSTAIN  04/11/2012    NO WBC SEEN RARE SQUAMOUS EPITHELIAL CELLS PRESENT MODERATE GRAM POSITIVE COCCI IN PAIRS RARE GRAM NEGATIVE RODS   GRAMSTAIN  04/11/2012    NO WBC SEEN RARE SQUAMOUS EPITHELIAL CELLS PRESENT MODERATE GRAM POSITIVE COCCI IN PAIRS RARE GRAM NEGATIVE RODS    CULT NO GROWTH 5 DAYS 01/29/2016   CULT NO GROWTH 5 DAYS 01/29/2016   LABORGA NO GROWTH 2 DAYS 07/19/2014     Lab Results  Component Value Date   ALBUMIN 3.5 10/18/2018   ALBUMIN 3.5 07/12/2018   ALBUMIN 3.9 10/21/2017    Body mass index is 25.83 kg/m.  Orders:  No orders of the defined types were placed in this encounter.  Meds ordered this encounter  Medications  . doxycycline (VIBRAMYCIN) 100 MG capsule    Sig: Take 1 capsule (100 mg total) by mouth 2 (two) times daily.    Dispense:  28 capsule    Refill:  1     Procedures: No procedures performed  Clinical Data: No additional findings.  ROS:  All other systems negative, except as noted in the HPI. Review of Systems  Objective: Vital Signs: Ht 5\' 10"  (1.778 m)   Wt 180 lb (81.6 kg)   BMI 25.83 kg/m   Specialty Comments:  No specialty comments available.  PMFS History: Patient Active Problem List   Diagnosis Date Noted  . STEMI (ST elevation myocardial infarction) ([place]) 07/12/2018  . STEMI involving left circumflex coronary artery ([place]) 07/12/2018  . Acute ST elevation myocardial infarction (STEMI) ([place])   . Status post transmetatarsal amputation of foot, right ([place]) 07/08/2018  . Limited mobility 05/12/2018  . Gingival foreign body 07/22/2017  . Anxiety state 06/02/2017  . Coated tongue 04/03/2017  . Leg swelling 01/29/2017  . Idiopathic chronic venous hypertension of both lower extremities with inflammation 09/24/2016  . Onychomycosis 09/24/2016  . Testicular mass 04/18/2016  . Thrush 04/18/2016  . Soft tissue swelling   . Osteomyelitis ([place]) 01/29/2016  . Diabetic polyneuropathy associated with type 2 diabetes mellitus ([place])   . Type 2 diabetes mellitus with neurologic complication, with long-term current use of insulin ([place]) 12/08/2015  . Left shoulder pain 08/10/2015  . Pulmonary nodule 02/01/2015  . Screening for colon cancer 01/09/2015  . Lumbar back pain 03/21/2014  . Screening for STD  (sexually transmitted disease) 12/15/2013  . Depression 12/09/2013  . Lightheadedness 11/19/2013  . Warts, genital 08/20/2013  . 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 Right leg pain 04/27/2013  . Diabetic retinopathy ([place]) 01/28/2013  . Visit for well man health check 11/19/2010  . Sleep apnea 09/06/2010  . 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 LEG EDEMA 03/03/2009  . Essential hypertension 11/09/2008  . COPD, mild ([place]) 10/06/2006  . SICKLE-CELL TRAIT 04/26/2006  . ERECTILE DYSFUNCTION 04/26/2006  . Tobacco abuse 04/26/2006  . PAD (peripheral artery disease) ([place]) 04/26/2006  . ALLERGIC RHINITIS 04/26/2006   Past Medical History:  Diagnosis Date  . Allergy   . Arthritis   . Bronchitis   . Cataract   . Chronic kidney disease   . Claudication [facility])    right foot ray resection  .  Colon polyps    hyperplastic  . COPD (chronic obstructive pulmonary disease) ([place])   . Diabetes mellitus   . Genital warts   . Gout   . Hyperlipidemia   . Hypertension   . Osteomyelitis of third toe of right foot ([place])   . Pneumonia   . Status post amputation of toe of right foot ([place]) 09/24/2016    Family History  Problem Relation Age of Onset  . Diabetes Mother   . Stroke Mother   . Heart failure Father   . Colon cancer Neg Hx   . Esophageal cancer Neg Hx   . Rectal cancer Neg Hx   . Stomach cancer Neg Hx     Past Surgical History:  Procedure Laterality Date  . AMPUTATION Right 01/31/2016   Procedure: Right 2nd Toe Amputation;  Surgeon: Newt Minion, MD;  Location: [facility];  Service: Orthopedics;  Laterality: Right;  . AMPUTATION Right 07/08/2018   Procedure: RIGHT TRANSMETATARSAL AMPUTATION;  Surgeon: Newt Minion, MD;  Location: [facility];  Service: Orthopedics;  Laterality: Right;  . CATARACT EXTRACTION     right eye  . COLONOSCOPY    . CORONARY STENT INTERVENTION N/A 07/12/2018   Procedure: CORONARY STENT INTERVENTION;  Surgeon: Troy Sine, MD;   Location: [facility];  Service: Cardiovascular;  Laterality: N/A;  . CORONARY/GRAFT ACUTE MI REVASCULARIZATION N/A 07/12/2018   Procedure: Coronary/Graft Acute MI Revascularization;  Surgeon: Troy Sine, MD;  Location: [facility] [facility];  Service: Cardiovascular;  Laterality: N/A;  . I&D EXTREMITY  04/11/2012   Procedure: IRRIGATION AND DEBRIDEMENT EXTREMITY;  Surgeon: Wylene Simmer, MD;  Location: [facility];  Service: Orthopedics;  Laterality: Right;  . LEFT HEART CATH AND CORONARY ANGIOGRAPHY N/A 07/12/2018   Procedure: LEFT HEART CATH AND CORONARY ANGIOGRAPHY;  Surgeon: Troy Sine, MD;  Location: [place] [facility];  Service: Cardiovascular;  Laterality: N/A;  . Surgery left great toe    . Tear ducts bilateral eyes    . TRANSMETATARSAL AMPUTATION Right 07/08/2018   Social History   Occupational History  . Occupation: disabled  Tobacco Use  . Smoking status: Current Every Day Smoker    Packs/day: 0.30    Years: 48.00    Pack years: 14.40    Types: Cigars, Cigarettes    Start date: 07/02/1963  . Smokeless tobacco: Former Systems developer  . Tobacco comment: wants to use electric cigarettes.  not interested in pills, worried about chantix side effects  Substance and Sexual Activity  . Alcohol use: Yes    Alcohol/week: 0.0 standard drinks    Comment: occassional use  . Drug use: No  . Sexual activity: Not on file

## 2018-12-21 ENCOUNTER — Ambulatory Visit (INDEPENDENT_AMBULATORY_CARE_PROVIDER_SITE_OTHER): Payer: No Typology Code available for payment source | Admitting: Orthopedic Surgery

## 2018-12-21 ENCOUNTER — Encounter: Payer: Self-pay | Admitting: Physician Assistant

## 2018-12-21 ENCOUNTER — Other Ambulatory Visit: Payer: Self-pay

## 2018-12-21 VITALS — Ht 70.0 in | Wt 180.0 lb

## 2018-12-21 DIAGNOSIS — Z89431 Acquired absence of right foot: Secondary | ICD-10-CM | POA: Diagnosis not present

## 2018-12-24 ENCOUNTER — Encounter: Payer: Self-pay | Admitting: Orthopedic Surgery

## 2018-12-24 NOTE — Progress Notes (Signed)
Office Visit Note   Patient: Adam Ray           Date of Birth: 1956-02-15           MRN: [id number] Visit Date: 12/21/2018              [facility]ed by: [facility] [address] [facility] [place],  [place] [address] PCP: [facility]  Chief Complaint  Patient presents with  . Right Leg - Follow-up      HPI: Patient is a 63 year old gentleman who is over 5 months out from a right transmetatarsal amputation.  He is currently wearing compression dressings he states the swelling is decreasing.  Assessment & Plan: Visit Diagnoses:  1. Status post transmetatarsal amputation of foot, right (H[facility])     Plan: Leg is wrapped with a Dynaflex plan to follow-up in 1 week.  Patient has orders for double upright brace from [facility] he also obtains care through the [place] system and he states he may try to go through the [place] system to get a larger size shoe.  Follow-Up Instructions: Return in about 1 week (around 12/28/2018).   Ortho Exam  Patient is alert, oriented, no adenopathy, well-dressed, normal affect, normal respiratory effort. Examination patient's transmetatarsal amputation is healing nicely after debridement there is a small 5 mm in diameter 1 mm deep wound which has healthy granulation tissue he has venous stasis swelling with brawny skin color changes in the right lower extremity no swelling in the left lower extremity.  Imaging: No results found. No images are attached to the encounter.  Labs: Lab Results  Component Value Date   HGBA1C 6.9 (H) 10/18/2018   HGBA1C 7.0 (H) 07/12/2018   HGBA1C 11.1 10/21/2017   ESRSEDRATE 8 01/29/2016   ESRSEDRATE 9 04/11/2012   CRP 2.6 (H) 01/29/2016   CRP 0.5 02/28/2014   REPTSTATUS 02/03/2016 FINAL 01/29/2016   REPTSTATUS 02/03/2016 FINAL 01/29/2016   GRAMSTAIN  04/11/2012    NO WBC SEEN RARE SQUAMOUS EPITHELIAL CELLS PRESENT MODERATE GRAM POSITIVE COCCI IN PAIRS RARE GRAM NEGATIVE RODS   GRAMSTAIN   04/11/2012    NO WBC SEEN RARE SQUAMOUS EPITHELIAL CELLS PRESENT MODERATE GRAM POSITIVE COCCI IN PAIRS RARE GRAM NEGATIVE RODS   CULT NO GROWTH 5 DAYS 01/29/2016   CULT NO GROWTH 5 DAYS 01/29/2016   LABORGA NO GROWTH 2 DAYS 07/19/2014     Lab Results  Component Value Date   ALBUMIN 3.5 10/18/2018   ALBUMIN 3.5 07/12/2018   ALBUMIN 3.9 10/21/2017    Body mass index is 25.83 kg/m.  Orders:  No orders of the defined types were placed in this encounter.  No orders of the defined types were placed in this encounter.    Procedures: No procedures performed  Clinical Data: No additional findings.  ROS:  All other systems negative, except as noted in the HPI. Review of Systems  Objective: Vital Signs: Ht 5\' 10"  (1.778 m)   Wt 180 lb (81.6 kg)   BMI 25.83 kg/m   Specialty Comments:  No specialty comments available.  PMFS History: Patient Active Problem List   Diagnosis Date Noted  . STEMI (ST elevation myocardial infarction) ([place]) 07/12/2018  . STEMI involving left circumflex coronary artery ([place]) 07/12/2018  . Acute ST elevation myocardial infarction (STEMI) ([place])   . Status post transmetatarsal amputation of foot, right ([place]) 07/08/2018  . Limited mobility 05/12/2018  . Gingival foreign body 07/22/2017  . Anxiety state 06/02/2017  . Coated tongue 04/03/2017  .  Leg swelling 01/29/2017  . Idiopathic chronic venous hypertension of both lower extremities with inflammation 09/24/2016  . Onychomycosis 09/24/2016  . Testicular mass 04/18/2016  . Thrush 04/18/2016  . Soft tissue swelling   . Osteomyelitis ([place]) 01/29/2016  . Diabetic polyneuropathy associated with type 2 diabetes mellitus ([place])   . Type 2 diabetes mellitus with neurologic complication, with long-term current use of insulin ([place]) 12/08/2015  . Left shoulder pain 08/10/2015  . Pulmonary nodule 02/01/2015  . Screening for colon cancer 01/09/2015  . Lumbar back pain 03/21/2014  . Screening for STD  (sexually transmitted disease) 12/15/2013  . Depression 12/09/2013  . Lightheadedness 11/19/2013  . Warts, genital 08/20/2013  . 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 Right leg pain 04/27/2013  . Diabetic retinopathy ([place]) 01/28/2013  . Visit for well man health check 11/19/2010  . Sleep apnea 09/06/2010  . 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 LEG EDEMA 03/03/2009  . Essential hypertension 11/09/2008  . COPD, mild ([place]) 10/06/2006  . SICKLE-CELL TRAIT 04/26/2006  . ERECTILE DYSFUNCTION 04/26/2006  . Tobacco abuse 04/26/2006  . PAD (peripheral artery disease) ([place]) 04/26/2006  . ALLERGIC RHINITIS 04/26/2006   Past Medical History:  Diagnosis Date  . Allergy   . Arthritis   . Bronchitis   . Cataract   . Chronic kidney disease   . Claudication [facility])    right foot ray resection  . Colon polyps    hyperplastic  . COPD (chronic obstructive pulmonary disease) ([place])   . Diabetes mellitus   . Genital warts   . Gout   . Hyperlipidemia   . Hypertension   . Osteomyelitis of third toe of right foot ([place])   . Pneumonia   . Status post amputation of toe of right foot ([place]) 09/24/2016    Family History  Problem Relation Age of Onset  . Diabetes Mother   . Stroke Mother   . Heart failure Father   . Colon cancer Neg Hx   . Esophageal cancer Neg Hx   . Rectal cancer Neg Hx   . Stomach cancer Neg Hx     Past Surgical History:  Procedure Laterality Date  . AMPUTATION Right 01/31/2016   Procedure: Right 2nd Toe Amputation;  Surgeon: Newt Minion, MD;  Location: [facility];  Service: Orthopedics;  Laterality: Right;  . AMPUTATION Right 07/08/2018   Procedure: RIGHT TRANSMETATARSAL AMPUTATION;  Surgeon: Newt Minion, MD;  Location: [facility];  Service: Orthopedics;  Laterality: Right;  . CATARACT EXTRACTION     right eye  . COLONOSCOPY    . CORONARY STENT INTERVENTION N/A 07/12/2018   Procedure: CORONARY STENT INTERVENTION;  Surgeon: Troy Sine, MD;   Location: [place] [facility];  Service: Cardiovascular;  Laterality: N/A;  . CORONARY/GRAFT ACUTE MI REVASCULARIZATION N/A 07/12/2018   Procedure: Coronary/Graft Acute MI Revascularization;  Surgeon: Troy Sine, MD;  Location: [place] [facility];  Service: Cardiovascular;  Laterality: N/A;  . I&D EXTREMITY  04/11/2012   Procedure: IRRIGATION AND DEBRIDEMENT EXTREMITY;  Surgeon: Wylene Simmer, MD;  Location: [facility];  Service: Orthopedics;  Laterality: Right;  . LEFT HEART CATH AND CORONARY ANGIOGRAPHY N/A 07/12/2018   Procedure: LEFT HEART CATH AND CORONARY ANGIOGRAPHY;  Surgeon: Troy Sine, MD;  Location: [place] [facility];  Service: Cardiovascular;  Laterality: N/A;  . Surgery left great toe    . Tear ducts bilateral eyes    . TRANSMETATARSAL AMPUTATION Right 07/08/2018   Social History   Occupational  History  . Occupation: disabled  Tobacco Use  . Smoking status: Current Every Day Smoker    Packs/day: 0.30    Years: 48.00    Pack years: 14.40    Types: Cigars, Cigarettes    Start date: 07/02/1963  . Smokeless tobacco: Former Systems developer  . Tobacco comment: wants to use electric cigarettes.  not interested in pills, worried about chantix side effects  Substance and Sexual Activity  . Alcohol use: Yes    Alcohol/week: 0.0 standard drinks    Comment: occassional use  . Drug use: No  . Sexual activity: Not on file

## 2018-12-25 ENCOUNTER — Telehealth: Payer: Self-pay | Admitting: Cardiology

## 2018-12-28 ENCOUNTER — Telehealth: Payer: Self-pay | Admitting: Cardiology

## 2018-12-28 NOTE — Telephone Encounter (Signed)
New message   Pt c/o swelling: STAT is pt has developed SOB within 24 hours  1) How much weight have you gained and in what time span? Patient does not know how much weight he has gained or what time span  2) If swelling, where is the swelling located? Mainly in right leg but swelling is in both legs   3) Are you currently taking a fluid pill? Yes   4) Are you currently SOB? Yes   5) Do you have a log of your daily weights (if so, list)? No   6) Have you gained 3 pounds in a day or 5 pounds in a week? No   7) Have you traveled recently? No

## 2018-12-28 NOTE — Telephone Encounter (Signed)
Spoke with pt who report for about a month he has been experiencing swelling in both his legs. He also report that he get a little SOB with exertion. Pt has an appointment scheduled with Kerin Ransom, PA tomorrow 6/30 at 2 pm. Pt encouraged to keep appointment and informed a message will be routed to update PA.

## 2018-12-29 ENCOUNTER — Encounter: Payer: Self-pay | Admitting: Cardiology

## 2018-12-29 ENCOUNTER — Telehealth (INDEPENDENT_AMBULATORY_CARE_PROVIDER_SITE_OTHER): Payer: Medicare HMO | Admitting: Cardiology

## 2018-12-29 ENCOUNTER — Telehealth: Payer: Self-pay

## 2018-12-29 VITALS — Ht 71.0 in | Wt 170.0 lb

## 2018-12-29 DIAGNOSIS — J449 Chronic obstructive pulmonary disease, unspecified: Secondary | ICD-10-CM

## 2018-12-29 DIAGNOSIS — I739 Peripheral vascular disease, unspecified: Secondary | ICD-10-CM | POA: Diagnosis not present

## 2018-12-29 DIAGNOSIS — Q231 Congenital insufficiency of aortic valve: Secondary | ICD-10-CM

## 2018-12-29 DIAGNOSIS — I10 Essential (primary) hypertension: Secondary | ICD-10-CM | POA: Diagnosis not present

## 2018-12-29 DIAGNOSIS — I2121 ST elevation (STEMI) myocardial infarction involving left circumflex coronary artery: Secondary | ICD-10-CM | POA: Diagnosis not present

## 2018-12-29 DIAGNOSIS — E1142 Type 2 diabetes mellitus with diabetic polyneuropathy: Secondary | ICD-10-CM

## 2018-12-29 DIAGNOSIS — R600 Localized edema: Secondary | ICD-10-CM | POA: Diagnosis not present

## 2018-12-29 NOTE — Progress Notes (Signed)
Virtual Visit via Telephone Note   This visit type was conducted due to national recommendations for restrictions regarding the COVID-19 Pandemic (e.g. social distancing) in an effort to limit this patient's exposure and mitigate transmission in our community.  Due to his co-morbid illnesses, this patient is at least at moderate risk for complications without adequate follow up.  This format is felt to be most appropriate for this patient at this time.  The patient did not have access to video technology/had technical difficulties with video requiring transitioning to audio format only (telephone).  All issues noted in this document were discussed and addressed.  No physical exam could be performed with this format.  Please refer to the patient's chart for his  consent to telehealth for [facility].   Date:  12/29/2018   ID:  Adam Ray, DOB [birth year], MRN [id number]  Patient Location: Home Provider Location: Office  PCP:  [facility]  Cardiologist:  Dr Claiborne Billings Electrophysiologist:  None   Evaluation Performed:  Follow-Up Visit  Chief Complaint:  Rt leg edema  History of Present Illness:    Adam Ray is a 63 y.o. male with history of coronary disease, he presented January 2020 with a circumflex MI treated with DES.  Other medical issues include insulin-dependent diabetes with polyneuropathy, hypertension, mild COPD, peripheral vascular disease status post transmetatarsal amputation earlier in January 2020, and a bicuspid aortic valve noted on echocardiogram with no aortic stenosis.  He was contacted for a virtual visit in April.  He was stable at that point.  Dr. Sharol Given performed his surgery in January.  Called the office recently with complaints of right lower extremity swelling.  He says this is been going on for about a month.  He has been taking furosemide but he does not think it helps.  He denies any hemoptysis but has had some shortness of breath.  He is a longtime  smoker.   The patient does not have symptoms concerning for COVID-19 infection (fever, chills, cough, or new shortness of breath).    Past Medical History:  Diagnosis Date  . Allergy   . Arthritis   . Bronchitis   . Cataract   . Chronic kidney disease   . Claudication [facility])    right foot ray resection  . Colon polyps    hyperplastic  . COPD (chronic obstructive pulmonary disease) ([place])   . Diabetes mellitus   . Genital warts   . Gout   . Hyperlipidemia   . Hypertension   . Osteomyelitis of third toe of right foot ([place])   . Pneumonia   . Status post amputation of toe of right foot ([place]) 09/24/2016   Past Surgical History:  Procedure Laterality Date  . AMPUTATION Right 01/31/2016   Procedure: Right 2nd Toe Amputation;  Surgeon: Newt Minion, MD;  Location: [place];  Service: Orthopedics;  Laterality: Right;  . AMPUTATION Right 07/08/2018   Procedure: RIGHT TRANSMETATARSAL AMPUTATION;  Surgeon: Newt Minion, MD;  Location: [place];  Service: Orthopedics;  Laterality: Right;  . CATARACT EXTRACTION     right eye  . COLONOSCOPY    . CORONARY STENT INTERVENTION N/A 07/12/2018   Procedure: CORONARY STENT INTERVENTION;  Surgeon: Troy Sine, MD;  Location: [place] CV LAB;  Service: Cardiovascular;  Laterality: N/A;  . CORONARY/GRAFT ACUTE MI REVASCULARIZATION N/A 07/12/2018   Procedure: Coronary/Graft Acute MI Revascularization;  Surgeon: Troy Sine, MD;  Location: [place] CV LAB;  Service: Cardiovascular;  Laterality: N/A;  . I&D EXTREMITY  04/11/2012   Procedure: IRRIGATION AND DEBRIDEMENT EXTREMITY;  Surgeon: Wylene Simmer, MD;  Location: [place];  Service: Orthopedics;  Laterality: Right;  . LEFT HEART CATH AND CORONARY ANGIOGRAPHY N/A 07/12/2018   Procedure: LEFT HEART CATH AND CORONARY ANGIOGRAPHY;  Surgeon: Troy Sine, MD;  Location: [place] CV LAB;  Service: Cardiovascular;  Laterality: N/A;  . Surgery left great toe    . Tear ducts bilateral eyes    .  TRANSMETATARSAL AMPUTATION Right 07/08/2018     Current Meds  Medication Sig  . acetaminophen (TYLENOL) 500 MG tablet Take 500 mg by mouth 2 (two) times daily as needed for headache (pain).   [name] aspirin 81 MG chewable tablet Chew 81 mg by mouth daily.  [name] atorvastatin (LIPITOR) 80 MG tablet Take 1 tablet (80 mg total) by mouth daily at 6 PM.  . baclofen (LIORESAL) 10 MG tablet Take 10 mg by mouth 2 (two) times daily as needed for muscle spasms.  . Cholecalciferol (VITAMIN D3) 50 MCG (2000 UT) TABS Take 2,000 Units by mouth daily.  [name] doxycycline (VIBRAMYCIN) 100 MG capsule Take 1 capsule (100 mg total) by mouth 2 (two) times daily.  . empagliflozin (JARDIANCE) 25 MG TABS tablet Take 12.5 mg by mouth daily.  . furosemide (LASIX) 20 MG tablet Take 60 mg by mouth 3 (three) times daily.   . insulin aspart (NOVOLOG FLEXPEN) 100 UNIT/ML FlexPen Inject 40 Units into the skin 3 (three) times daily with meals.  . insulin glargine (LANTUS) 100 UNIT/ML injection Inject 40 Units into the skin 2 (two) times daily.  [name] lisinopril (ZESTRIL) 5 MG tablet Take 5 mg by mouth daily.  . metFORMIN (GLUCOPHAGE-XR) 500 MG 24 hr tablet Take 250 mg by mouth 2 (two) times a day.   . metoprolol tartrate (LOPRESSOR) 25 MG tablet Take 0.5 tablets (12.5 mg total) by mouth 2 (two) times daily. (Patient taking differently: Take 25 mg by mouth daily. )  . nitroGLYCERIN (NITROSTAT) 0.4 MG SL tablet Place 1 tablet (0.4 mg total) under the tongue every 5 (five) minutes x 3 doses as needed for chest pain.  . rosuvastatin (CRESTOR) 40 MG tablet Take 40 mg by mouth daily.   . sildenafil (VIAGRA) 100 MG tablet Take 100 mg by mouth daily as needed for erectile dysfunction.  . ticagrelor (BRILINTA) 90 MG TABS tablet Take 1 tablet (90 mg total) by mouth 2 (two) times daily. (Patient taking differently: Take 90 mg by mouth daily. )  . [DISCONTINUED] furosemide (LASIX) 40 MG tablet Take 1 tablet (40 mg total) by mouth daily.  . [DISCONTINUED]  latanoprost (XALATAN) 0.005 % ophthalmic solution Place 1 drop into both eyes at bedtime.  . [DISCONTINUED] spironolactone (ALDACTONE) 25 MG tablet Take 50 mg by mouth daily.     Allergies:   Codeine   Social History   Tobacco Use  . Smoking status: Current Every Day Smoker    Packs/day: 0.30    Years: 48.00    Pack years: 14.40    Types: Cigars, Cigarettes    Start date: 07/02/1963  . Smokeless tobacco: Former Systems developer  . Tobacco comment: wants to use electric cigarettes.  not interested in pills, worried about chantix side effects  Substance Use Topics  . Alcohol use: Yes    Alcohol/week: 0.0 standard drinks    Comment: occassional use  . Drug use: No     Family Hx: The patient's family history includes Diabetes in his  mother; Heart failure in his father; Stroke in his mother. There is no history of Colon cancer, Esophageal cancer, Rectal cancer, or Stomach cancer.  ROS:   Please see the history of present illness.    All other systems reviewed and are negative.   Prior CV studies:   The following studies were reviewed today: Echo Jan 2020  Labs/Other Tests and Data Reviewed:    EKG:  No ECG reviewed.  Recent Labs: 07/12/2018: TSH 1.131 10/18/2018: ALT 21; BUN 12; Creatinine, Ser 1.18; Hemoglobin 11.7; Platelets 139; Potassium 4.3; Sodium 140   Recent Lipid Panel Lab Results  Component Value Date/Time   CHOL 112 10/18/2018 04:12 PM   CHOL 159 10/21/2017 09:02 AM   TRIG 220 (H) 10/18/2018 04:12 PM   HDL 46 10/18/2018 04:12 PM   HDL 50 10/21/2017 09:02 AM   CHOLHDL 2.4 10/18/2018 04:12 PM   LDLCALC NOT CALCULATED 10/18/2018 04:12 PM   LDLCALC 65 10/21/2017 09:02 AM   LDLDIRECT 78 09/11/2011 03:03 PM    Wt Readings from Last 3 Encounters:  12/29/18 170 lb (77.1 kg)  12/21/18 180 lb (81.6 kg)  12/18/18 180 lb (81.6 kg)     Objective:    Vital Signs:  Ht 5\' 11"  (1.803 m)   Wt 170 lb (77.1 kg)   BMI 23.71 kg/m    VITAL SIGNS:  reviewed  ASSESSMENT & PLAN:     Rt LE edema- Pretty difficult to evaluate this over the phone but DVT is a concern.  I have arranged for LE venous dopplers tomorrow.  I think he should also be seen by a provider.   CAD- STEMI Jan 2020- CFX PCI with DES (no other significant CAD).  BiCuspid AOV- No AS, normal LVF  HTN- Unable to assess B/P  PVD- S/P Rt MT surgery 07/08/2018-Dr Sharol Given  IDDM- Normal renal fucntion   COVID-19 Education: The signs and symptoms of COVID-19 were discussed with the patient and how to seek care for testing (follow up with PCP or arrange E-visit).  The importance of social distancing was discussed today.  Time:   Today, I have spent 15 minutes with the patient with telehealth technology discussing the above problems.     Medication Adjustments/Labs and Tests Ordered: Current medicines are reviewed at length with the patient today.  Concerns regarding medicines are outlined above.   Tests Ordered: No orders of the defined types were placed in this encounter.   Medication Changes: No orders of the defined types were placed in this encounter.   Follow Up:  Venous doppler tomorrow- trying to get him seen by a provider as well.  It seems unlikley this is CHF but it difficult to assess him over the phone.   Signed, Kerin Ransom, PA-C  12/29/2018 2:10 PM    [facility]

## 2018-12-29 NOTE — Progress Notes (Signed)
Cardiology Office Note    ERROR NO Show

## 2018-12-29 NOTE — Telephone Encounter (Signed)
Contacted patient to discuss AVS Instructions. Gave patient Luke's recommendations from today's virtual office visit. Informed patient that someone from the scheduling dept will be in contact with him to schedule his follow up appt with Dr Gwenlyn Found. Patient voiced understanding and AVS will be given to patient at tomorrows appt.   PATIENT INFORMED TO COME TO THE OFFICE WITH A MASK AND WILL BRING WIFE DUE TO HIS COGNITIVE REASONS.         COVID-19 Pre-Screening Questions:  . In the past 7 to 10 days have you had a cough,  shortness of breath, headache, congestion, fever (100 or greater) body aches, chills, sore throat, or sudden loss of taste or sense of smell? NO . Have you been around anyone with known Covid 19? NO . Have you been around anyone who is awaiting Covid 19 test results in the past 7 to 10 days? NO . Have you been around anyone who has been exposed to Covid 19, or has mentioned symptoms of Covid 19 within the past 7 to 10 days?NO  If you have any concerns/questions about symptoms patients report during screening (either on the phone or at threshold). Contact the provider seeing the patient or DOD for further guidance.  If neither are available contact a member of the leadership team.

## 2018-12-29 NOTE — Patient Instructions (Addendum)
Medication Instructions:  Your physician recommends that you continue on your current medications as directed. Please refer to the Current Medication list given to you today. If you need a refill on your cardiac medications before your next appointment, please call your pharmacy.   Lab work: NONE  If you have labs (blood work) drawn today and your tests are completely normal, you will receive your results only by: [name] MyChart Message (if you have MyChart) OR . A paper copy in the mail If you have any lab test that is abnormal or we need to change your treatment, we will call you to review the results.  Testing/Procedures: Your physician has requested that you have a lower extremity venous duplex. This test is an ultrasound of the veins in the legs. It looks at venous blood flow that carries blood from the heart to the legs or arms. Allow one hour for a Lower Venous exam. Allow thirty minutes for an Upper Venous exam. There are no restrictions or special instructions. TEST NEEDS TO BE SCHEDULED FOR TODAY OR TOMORROW  APPOINTMENT MADE FOR 12/30/2018 AT 1PM  Follow-Up: At [facility], you and your health needs are our priority.  As part of our continuing mission to provide you with exceptional heart care, we have created designated Provider Care Teams.  These Care Teams include your primary Cardiologist (physician) and Advanced Practice Providers (APPs -  Physician Assistants and Nurse Practitioners) who all work together to provide you with the care you need, when you need it. You will need a follow up appointment in 3 months.  Please call our office 2 months in advance to schedule this appointment.  You may see Dr Quay Burow or one of the following Advanced Practice Providers on your designated Care Team:   Kerin Ransom, PA-C Roby Lofts, [place] . Sande Rives, PA-C  YOU HAVE AN APPT SCHEDULED FOR TOMORROW WITH KATHRYN LAWRENCE  Any Other Special Instructions Will Be Listed Below (If  Applicable).

## 2018-12-29 NOTE — Assessment & Plan Note (Signed)
Pt complains of RLE edema "for a month".   R/O DVT

## 2018-12-30 ENCOUNTER — Encounter: Payer: Self-pay | Admitting: Adult Health

## 2018-12-30 ENCOUNTER — Encounter: Payer: Non-veteran care | Admitting: Adult Health

## 2018-12-30 ENCOUNTER — Ambulatory Visit (HOSPITAL_COMMUNITY)
Admission: RE | Admit: 2018-12-30 | Discharge: 2018-12-30 | Disposition: A | Discharge status: Home / Self Care | Payer: No Typology Code available for payment source | Source: Ambulatory Visit | Attending: Cardiovascular Disease | Admitting: Cardiovascular Disease

## 2018-12-30 ENCOUNTER — Telehealth: Payer: Self-pay | Admitting: Adult Health

## 2018-12-30 ENCOUNTER — Other Ambulatory Visit: Payer: Self-pay

## 2018-12-30 ENCOUNTER — Telehealth: Payer: Self-pay | Admitting: Cardiovascular Disease

## 2018-12-30 DIAGNOSIS — R600 Localized edema: Secondary | ICD-10-CM

## 2018-12-30 DIAGNOSIS — I10 Essential (primary) hypertension: Secondary | ICD-10-CM

## 2018-12-30 DIAGNOSIS — I739 Peripheral vascular disease, unspecified: Secondary | ICD-10-CM

## 2018-12-30 DIAGNOSIS — Q231 Congenital insufficiency of aortic valve: Secondary | ICD-10-CM

## 2018-12-30 DIAGNOSIS — I2510 Atherosclerotic heart disease of native coronary artery without angina pectoris: Secondary | ICD-10-CM

## 2018-12-30 DIAGNOSIS — I2121 ST elevation (STEMI) myocardial infarction involving left circumflex coronary artery: Secondary | ICD-10-CM | POA: Diagnosis present

## 2018-12-30 DIAGNOSIS — J449 Chronic obstructive pulmonary disease, unspecified: Secondary | ICD-10-CM

## 2018-12-30 DIAGNOSIS — E1142 Type 2 diabetes mellitus with diabetic polyneuropathy: Secondary | ICD-10-CM

## 2018-12-30 NOTE — Telephone Encounter (Signed)
New Message           Patient is calling because he has questions concerning the test today. Pls call to advise

## 2018-12-30 NOTE — Telephone Encounter (Signed)
New Message  Patient calling in to follow up with questions after the doppler ultrasound that was done on his right leg today. Patient states that he was supposed to be coming in to have his heart checked and not his leg. Wants to know why the ultrasound was performed on his leg vs. His heart. Please give patient a call to further explain.

## 2018-12-30 NOTE — Telephone Encounter (Signed)
Open n error °

## 2018-12-30 NOTE — Telephone Encounter (Signed)
Left message to callback   RN spoke to extender- patient  Arrive to office to  Doppler test ( dvt)  Patient was suppose to have appointment with extender today , but patient left the office before  Appointment started,

## 2018-12-31 ENCOUNTER — Telehealth: Payer: Self-pay | Admitting: Adult Health

## 2018-12-31 ENCOUNTER — Telehealth: Payer: Self-pay | Admitting: Cardiology

## 2018-12-31 ENCOUNTER — Telehealth: Payer: Self-pay | Admitting: Orthopedic Surgery

## 2018-12-31 ENCOUNTER — Telehealth: Payer: Self-pay

## 2018-12-31 ENCOUNTER — Encounter: Payer: Self-pay | Admitting: Physician Assistant

## 2018-12-31 ENCOUNTER — Ambulatory Visit (INDEPENDENT_AMBULATORY_CARE_PROVIDER_SITE_OTHER): Payer: Medicare HMO | Admitting: Physician Assistant

## 2018-12-31 VITALS — Ht 71.0 in | Wt 170.0 lb

## 2018-12-31 DIAGNOSIS — I739 Peripheral vascular disease, unspecified: Secondary | ICD-10-CM | POA: Diagnosis not present

## 2018-12-31 DIAGNOSIS — I87323 Chronic venous hypertension (idiopathic) with inflammation of bilateral lower extremity: Secondary | ICD-10-CM | POA: Diagnosis not present

## 2018-12-31 DIAGNOSIS — E1142 Type 2 diabetes mellitus with diabetic polyneuropathy: Secondary | ICD-10-CM

## 2018-12-31 DIAGNOSIS — Z89431 Acquired absence of right foot: Secondary | ICD-10-CM

## 2018-12-31 NOTE — Telephone Encounter (Signed)
Returned call to patient.Advised he had appointment with Jory Sims DNP yesterday after his dopplers.Stated he was told ok to leave.Appointment rescheduled with Jory Sims DNP 7/6 at 2:45 pm.Stated he needs wife to come back with him she is his assistant.

## 2018-12-31 NOTE — Telephone Encounter (Signed)
I faxed Last 2 office notes with demo faxed to [facility] at [phone] on 12/31/2018

## 2018-12-31 NOTE — Telephone Encounter (Signed)
New Message   Patient requesting for a call back!

## 2018-12-31 NOTE — Telephone Encounter (Signed)
I called the patient patient to discuss his doppler results.  He was supposed to have an office visit after the test but when the [place] tech said he was done he left.  He does not think the correct test was ordered.  He told me a doctor at the [place] told him in 2019 he had "too much fat" around his heart.  I explained that his echo in Jan 2020 showed his heart function was good and that I did not think he had heart failure (unilateral edema) but to be sure that is why I scheduled an in person visit after his study.    He was also concerned that he was in the doppler room with the tech without an escort and that his wife was not allowed to accompany him to the clinic. I explained the reasoning behind not allowing visitors but he was not satisfied. I have referred him to our office manager and offered to schedule him an appointment with Dr Gwenlyn Found.   Kerin Ransom PA-C 12/31/2018 9:02 AM

## 2018-12-31 NOTE — Telephone Encounter (Signed)
LVM, reminding pt of his appt with Jory Sims on 01-04-19.

## 2018-12-31 NOTE — Progress Notes (Signed)
Office Visit Note   Patient: Adam Ray           Date of Birth: [birth year]           MRN: [id number] Visit Date: 12/31/2018              Requested by: [facility] [place],  [place] [id number] PCP: [facility]  Chief Complaint  Patient presents with   Right Foot - Follow-up    TMA 07/08/18 right foot      HPI: The patient is a 63 year old gentleman with a history of a right transmetatarsal amputation in January 2020 who presents for follow-up of bilateral lower extremity edema and for a small residual ulceration over the distal residual right transmetatarsal amputation.  He was placed in a Dynaflex wrap to the right lower extremity and his edema is markedly improved.  He does have medical compression stockings at home and believes that he can begin wearing these again to control his peripheral edema.  He is on Lasix but does not feel that this is helped much with his peripheral edema. He is following up with cardiology concerning the peripheral edema and underwent a venous Doppler study of the right lower extremity as well on July 1 and this was negative for DVT. Assessment & Plan: Visit Diagnoses:  1. Status post transmetatarsal amputation of foot, right ([place])   2. Diabetic polyneuropathy associated with type 2 diabetes mellitus ([place])   3. Idiopathic chronic venous hypertension of both lower extremities with inflammation   4. Peripheral vascular disease ([place])     Plan: The patient was provided a Vive medical compression shrinker stocking to wear on the right lower extremity to help try to control his edema now that his edema is markedly improved following a compression wrap.  This can be applied directly over the very small residual open area over his transmetatarsal amputation and should help this heal as well.  Have recommended that he wear some type of medical compression on the left side as well.  He will continue to follow  with cardiology concerning his edema for any further work-up such as 2D echo or other evaluation is indicated.  He will follow-up here in 2 weeks or sooner should he have difficulty in the interim. He is also working with the [place] system to obtain appropriate foot wear and inserts.  He has a double upright brace ordered through [facility] but again is trying to pursue this through the [place] system. Follow-Up Instructions: Return in about 2 weeks (around 01/14/2019).   Ortho Exam  Patient is alert, oriented, no adenopathy, well-dressed, normal affect, normal respiratory effort. Right lower extremity edema is much improved following application of Dynaflex multilayer compression wrap.  He has good palpable pedal pulses.  He has no signs of cellulitis or infection.  There is now wrinkling of the skin and no pitting.  He has a 5 mm residual shallow ulcer over the distal residual limb and the incision line and he is going to wear a medical compression sock over this directly in contact with the skin to help with the healing as well as to control edema.  He has mild pitting edema over the left lower extremity without ulceration or signs of cellulitis.  Imaging: No results found.   Labs: Lab Results  Component Value Date   HGBA1C 6.9 (H) 10/18/2018   HGBA1C 7.0 (H) 07/12/2018   HGBA1C 11.1 10/21/2017   ESRSEDRATE 8 01/29/2016  ESRSEDRATE 9 04/11/2012   CRP 2.6 (H) 01/29/2016   CRP 0.5 02/28/2014   REPTSTATUS 02/03/2016 FINAL 01/29/2016   REPTSTATUS 02/03/2016 FINAL 01/29/2016   GRAMSTAIN  04/11/2012    NO WBC SEEN RARE SQUAMOUS EPITHELIAL CELLS PRESENT MODERATE GRAM POSITIVE COCCI IN PAIRS RARE GRAM NEGATIVE RODS   GRAMSTAIN  04/11/2012    NO WBC SEEN RARE SQUAMOUS EPITHELIAL CELLS PRESENT MODERATE GRAM POSITIVE COCCI IN PAIRS RARE GRAM NEGATIVE RODS   CULT NO GROWTH 5 DAYS 01/29/2016   CULT NO GROWTH 5 DAYS 01/29/2016   LABORGA NO GROWTH 2 DAYS 07/19/2014     Lab Results  Component Value  Date   ALBUMIN 3.5 10/18/2018   ALBUMIN 3.5 07/12/2018   ALBUMIN 3.9 10/21/2017    No results found for: MG No results found for: VD25OH  No results found for: PREALBUMIN CBC EXTENDED Latest Ref Rng & Units 10/18/2018 07/13/2018 07/12/2018  WBC 4.0 - 10.5 K/uL 3.2(L) 4.3 4.9  RBC 4.22 - 5.81 MIL/uL 4.08(L) 4.24 4.48  HGB 13.0 - 17.0 g/dL 11.7(L) 12.6(L) 13.2  HCT 39.0 - 52.0 % 37.4(L) 38.1(L) 40.9  PLT 150 - 400 K/uL 139(L) 131(L) 129(L)  NEUTROABS 1.7 - 7.7 K/uL - - 1.6(L)  LYMPHSABS 0.7 - 4.0 K/uL - - 2.4     Body mass index is 23.71 kg/m.  Orders:  No orders of the defined types were placed in this encounter.  No orders of the defined types were placed in this encounter.    Procedures: No procedures performed  Clinical Data: No additional findings.  ROS:  All other systems negative, except as noted in the HPI. Review of Systems  Objective: Vital Signs: Ht 5\' 11"  (1.803 m)    Wt 170 lb (77.1 kg)    BMI 23.71 kg/m   Specialty Comments:  No specialty comments available.  PMFS History: Patient Active Problem List   Diagnosis Date Noted   Edema leg 12/29/2018   STEMI (ST elevation myocardial infarction) ([place]) 07/12/2018   STEMI involving left circumflex coronary artery ([place]) 07/12/2018   Acute ST elevation myocardial infarction (STEMI) [facility])    Status post transmetatarsal amputation of foot, right ([place]) 07/08/2018   Limited mobility 05/12/2018   Gingival foreign body 07/22/2017   Anxiety state 06/02/2017   Coated tongue 04/03/2017   Leg swelling 01/29/2017   Idiopathic chronic venous hypertension of both lower extremities with inflammation 09/24/2016   Onychomycosis 09/24/2016   Testicular mass 04/18/2016   Thrush 04/18/2016   Soft tissue swelling    Osteomyelitis ([place]) 01/29/2016   Diabetic polyneuropathy associated with type 2 diabetes mellitus ([place])    Type 2 diabetes mellitus with neurologic complication, with long-term current use  of insulin ([place]) 12/08/2015   Left shoulder pain 08/10/2015   Pulmonary nodule 02/01/2015   Screening for colon cancer 01/09/2015   Lumbar back pain 03/21/2014   Screening for STD (sexually transmitted disease) 12/15/2013   Depression 12/09/2013   Lightheadedness 11/19/2013   Warts, genital 08/20/2013   Moderate nonproliferative diabetic retinopathy(362.05) 05/26/2013   Right leg pain 04/27/2013   Diabetic retinopathy ([place]) 01/28/2013   Visit for well man health check 11/19/2010   Sleep apnea 09/06/2010   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   LEG EDEMA 03/03/2009   Essential hypertension 11/09/2008   COPD, mild ([place]) 10/06/2006   SICKLE-CELL TRAIT 04/26/2006   ERECTILE DYSFUNCTION 04/26/2006   Tobacco abuse 04/26/2006   PAD (peripheral artery disease) ([place]) 04/26/2006   ALLERGIC RHINITIS 04/26/2006   Past Medical  History:  Diagnosis Date   Allergy    Arthritis    Bronchitis    Cataract    Chronic kidney disease    Claudication (HCC)    right foot ray resection   Colon polyps    hyperplastic   COPD (chronic obstructive pulmonary disease) (HCC)    Diabetes mellitus    Genital warts    Gout    Hyperlipidemia    Hypertension    Osteomyelitis of third toe of right foot (HCC)    Pneumonia    Status post amputation of toe of right foot ([place]) 09/24/2016    Family History  Problem Relation Age of Onset   Diabetes Mother    Stroke Mother    Heart failure Father    Colon cancer Neg Hx    Esophageal cancer Neg Hx    Rectal cancer Neg Hx    Stomach cancer Neg Hx     Past Surgical History:  Procedure Laterality Date   AMPUTATION Right 01/31/2016   Procedure: Right 2nd Toe Amputation;  Surgeon: Newt Minion, MD;  Location: [facility];  Service: Orthopedics;  Laterality: Right;   AMPUTATION Right 07/08/2018   Procedure: RIGHT TRANSMETATARSAL AMPUTATION;  Surgeon: Newt Minion, MD;  Location: [facility];  Service: Orthopedics;  Laterality:  Right;   CATARACT EXTRACTION     right eye   COLONOSCOPY     CORONARY STENT INTERVENTION N/A 07/12/2018   Procedure: CORONARY STENT INTERVENTION;  Surgeon: Troy Sine, MD;  Location: [facility];  Service: Cardiovascular;  Laterality: N/A;   CORONARY/GRAFT ACUTE MI REVASCULARIZATION N/A 07/12/2018   Procedure: Coronary/Graft Acute MI Revascularization;  Surgeon: Troy Sine, MD;  Location: [facility];  Service: Cardiovascular;  Laterality: N/A;   I&D EXTREMITY  04/11/2012   Procedure: IRRIGATION AND DEBRIDEMENT EXTREMITY;  Surgeon: Wylene Simmer, MD;  Location: [facility];  Service: Orthopedics;  Laterality: Right;   LEFT HEART CATH AND CORONARY ANGIOGRAPHY N/A 07/12/2018   Procedure: LEFT HEART CATH AND CORONARY ANGIOGRAPHY;  Surgeon: Troy Sine, MD;  Location: [facility];  Service: Cardiovascular;  Laterality: N/A;   Surgery left great toe     Tear ducts bilateral eyes     TRANSMETATARSAL AMPUTATION Right 07/08/2018   Social History   Occupational History   Occupation: disabled  Tobacco Use   Smoking status: Current Every Day Smoker    Packs/day: 0.30    Years: 48.00    Pack years: 14.40    Types: Cigars, Cigarettes    Start date: 07/02/1963   Smokeless tobacco: Former Systems developer   Tobacco comment: wants to use electric cigarettes.  not interested in pills, worried about chantix side effects  Substance and Sexual Activity   Alcohol use: Yes    Alcohol/week: 0.0 standard drinks    Comment: occassional use   Drug use: No   Sexual activity: Not on file

## 2018-12-31 NOTE — Telephone Encounter (Signed)
Pt authorization for [facility] has expired as of June 2020 and the [place] said they need for you to fax over the pt last 2 office notes with request for more office visits.

## 2018-12-31 NOTE — Telephone Encounter (Signed)
Pt was upset when speaking with provider earlier today. Left message wanting to talk to him about his experience yesterday not being able to bring family into office and to schedule him an appointment with Dr. Gwenlyn Found.

## 2019-01-02 NOTE — Progress Notes (Deleted)
Cardiology Office Note   Date:  01/02/2019   ID:  Adam Ray, DOB 04/02/1956, MRN [id number]  PCP[facility]  [facility]  Cardiologist:  Dr. Gwenlyn Found  No chief complaint on file.    History of Present Illness: Adam Ray is a 63 y.o. male who presents for  for follow up after having doppler ultrasound of right lower extremity edema .  He was seen virtually by Kerin Ransom yesterday 12/29/2018 for complaints of pain and lower extremity edema.  He has a history of coronary artery disease, with myocardial infarction and January 2020 requiring drug-eluting stent to the circumflex, hypertension, peripheral vascular disease status post transmetatarsal amputation January 2020.   Other medical issues include insulin-dependent diabetes with polyneuropathy, hypertension, mild COPD, peripheral vascular disease status post transmetatarsal amputation earlier in January 2020, and a bicuspid aortic valve noted on echocardiogram with no aortic stenosis.  Due to difficulty evaluating his leg over the telephone, he is asked to come to the office for doppler ultrasound to r/o DVT, and see me on follow up.   On day of Doppler study, the patient was to have an appointment in the office but he left after the imaging study.  He also became upset when his family was not allowed to join him in the clinic due to [place] pandemic restrictions.  He was called several times to have repeat appointment and to explain restrictions to him.  He did not answer any calls that were returned.  Past Medical History:  Diagnosis Date  . Allergy   . Arthritis   . Bronchitis   . Cataract   . Chronic kidney disease   . Claudication [facility])    right foot ray resection  . Colon polyps    hyperplastic  . COPD (chronic obstructive pulmonary disease) ([place])   . Diabetes mellitus   . Genital warts   . Gout   . Hyperlipidemia   . Hypertension   . Osteomyelitis of third toe of right foot ([place])   . Pneumonia   . Status post  amputation of toe of right foot ([place]) 09/24/2016    Past Surgical History:  Procedure Laterality Date  . AMPUTATION Right 01/31/2016   Procedure: Right 2nd Toe Amputation;  Surgeon: Newt Minion, MD;  Location: [facility];  Service: Orthopedics;  Laterality: Right;  . AMPUTATION Right 07/08/2018   Procedure: RIGHT TRANSMETATARSAL AMPUTATION;  Surgeon: Newt Minion, MD;  Location: [facility];  Service: Orthopedics;  Laterality: Right;  . CATARACT EXTRACTION     right eye  . COLONOSCOPY    . CORONARY STENT INTERVENTION N/A 07/12/2018   Procedure: CORONARY STENT INTERVENTION;  Surgeon: Troy Sine, MD;  Location: [facility] [place] [facility];  Service: Cardiovascular;  Laterality: N/A;  . CORONARY/GRAFT ACUTE MI REVASCULARIZATION N/A 07/12/2018   Procedure: Coronary/Graft Acute MI Revascularization;  Surgeon: Troy Sine, MD;  Location: [facility] [facility];  Service: Cardiovascular;  Laterality: N/A;  . I&D EXTREMITY  04/11/2012   Procedure: IRRIGATION AND DEBRIDEMENT EXTREMITY;  Surgeon: Wylene Simmer, MD;  Location: [facility];  Service: Orthopedics;  Laterality: Right;  . LEFT HEART CATH AND CORONARY ANGIOGRAPHY N/A 07/12/2018   Procedure: LEFT HEART CATH AND CORONARY ANGIOGRAPHY;  Surgeon: Troy Sine, MD;  Location: [facility];  Service: Cardiovascular;  Laterality: N/A;  . Surgery left great toe    . Tear ducts bilateral eyes    . TRANSMETATARSAL AMPUTATION Right 07/08/2018     Current Outpatient Medications  Medication Sig Dispense Refill  . acetaminophen (TYLENOL) 500 MG tablet Take 500 mg by mouth 2 (two) times daily as needed for headache (pain).     [name] aspirin 81 MG chewable tablet Chew 81 mg by mouth daily.    [name] atorvastatin (LIPITOR) 80 MG tablet Take 1 tablet (80 mg total) by mouth daily at 6 PM. 30 tablet 11  . baclofen (LIORESAL) 10 MG tablet Take 10 mg by mouth 2 (two) times daily as needed for muscle spasms.    . Cholecalciferol (VITAMIN D3) 50 MCG (2000 UT) TABS Take 2,000 Units by  mouth daily.    [name] doxycycline (VIBRAMYCIN) 100 MG capsule Take 1 capsule (100 mg total) by mouth 2 (two) times daily. 28 capsule 1  . empagliflozin (JARDIANCE) 25 MG TABS tablet Take 12.5 mg by mouth daily.    . furosemide (LASIX) 20 MG tablet Take 60 mg by mouth 3 (three) times daily.     . insulin aspart (NOVOLOG FLEXPEN) 100 UNIT/ML FlexPen Inject 40 Units into the skin 3 (three) times daily with meals.    . insulin glargine (LANTUS) 100 UNIT/ML injection Inject 40 Units into the skin 2 (two) times daily.    [name] lisinopril (ZESTRIL) 5 MG tablet Take 5 mg by mouth daily.    . metFORMIN (GLUCOPHAGE-XR) 500 MG 24 hr tablet Take 250 mg by mouth 2 (two) times a day.     . metoprolol tartrate (LOPRESSOR) 25 MG tablet Take 0.5 tablets (12.5 mg total) by mouth 2 (two) times daily. (Patient taking differently: Take 25 mg by mouth daily. ) 60 tablet 11  . nitroGLYCERIN (NITROSTAT) 0.4 MG SL tablet Place 1 tablet (0.4 mg total) under the tongue every 5 (five) minutes x 3 doses as needed for chest pain. 25 tablet 11  . rosuvastatin (CRESTOR) 40 MG tablet Take 40 mg by mouth daily.     . sildenafil (VIAGRA) 100 MG tablet Take 100 mg by mouth daily as needed for erectile dysfunction.    . ticagrelor (BRILINTA) 90 MG TABS tablet Take 1 tablet (90 mg total) by mouth 2 (two) times daily. (Patient taking differently: Take 90 mg by mouth daily. ) 60 tablet 11   No current facility-administered medications for this visit.     Allergies:   Codeine    Social History:  The patient  reports that he has been smoking cigars and cigarettes. He started smoking about 55 years ago. He has a 14.40 pack-year smoking history. He has quit using smokeless tobacco. He reports current alcohol use. He reports that he does not use drugs.   Family History:  The patient's family history includes Diabetes in his mother; Heart failure in his father; Stroke in his mother.    ROS: All other systems are reviewed and negative. Unless  otherwise mentioned in H&P    PHYSICAL EXAM: VS:  There were no vitals taken for this visit. , BMI There is no height or weight on file to calculate BMI. GEN: Well nourished, well developed, in no acute distress HEENT: normal Neck: no JVD, carotid bruits, or masses Cardiac: ***RRR; no murmurs, rubs, or gallops,no edema  Respiratory:  Clear to auscultation bilaterally, normal work of breathing GI: soft, nontender, nondistended, + BS MS: no deformity or atrophy Skin: warm and dry, no rash Neuro:  Strength and sensation are intact Psych: euthymic mood, full affect   EKG:  EKG {ACTION; IS/IS [id number]:[id number]} ordered today. The ekg ordered today demonstrates ***   Recent Labs:  07/12/2018: TSH 1.131 10/18/2018: ALT 21; BUN 12; Creatinine, Ser 1.18; Hemoglobin 11.7; Platelets 139; Potassium 4.3; Sodium 140    Lipid Panel    Component Value Date/Time   CHOL 112 10/18/2018 1612   CHOL 159 10/21/2017 0902   TRIG 220 (H) 10/18/2018 1612   HDL 46 10/18/2018 1612   HDL 50 10/21/2017 0902   CHOLHDL 2.4 10/18/2018 1612   VLDL 44 (H) 10/18/2018 1612   LDLCALC NOT CALCULATED 10/18/2018 1612   LDLCALC 65 10/21/2017 0902   LDLDIRECT 78 09/11/2011 1503      Wt Readings from Last 3 Encounters:  12/31/18 170 lb (77.1 kg)  12/29/18 170 lb (77.1 kg)  12/21/18 180 lb (81.6 kg)    Other studies Reviewed: Right: No evidence of deep vein thrombosis in the lower extremity. No indirect evidence of obstruction proximal to the inguinal ligament. No cystic structure found in the popliteal fossa. Ultrasound characteristics of enlarged lymph nodes are noted in the groin. Two large lymph nodes seen in groin. Largest measuring 3.5 x 1.6 x 1.3 cm. Left: No evidence of common femoral vein obstruction.  ASSESSMENT AND PLAN:  1.  ***   Current medicines are reviewed at length with the patient today.    Labs/ tests ordered today include: *** Phill Myron. [place] P[place], ANP, AACC   01/02/2019 5:52 PM     [facility] [place] [address] Office [phone] Fax [phone]

## 2019-01-04 ENCOUNTER — Ambulatory Visit: Payer: Non-veteran care | Admitting: Adult Health

## 2019-01-04 NOTE — Progress Notes (Signed)
Cardiology Office Note   Date:  01/05/2019   ID:  Adam Ray, DOB 04/22/1956, MRN [id number]  PCP:  [facility]  Cardiologist:  Gwenlyn Found  Chief Complaint  Patient presents with   Follow-up     History of Present Illness: Adam Ray is a 63 y.o. male who presents for follow up after having doppler ultrasound of right lower extremity edema .  He was seen virtually by Kerin Ransom yesterday 12/29/2018 for complaints of pain and lower extremity edema.   He has a history of coronary artery disease, with myocardial infarction and January 2020 requiring drug-eluting stent to the circumflex, hypertension, peripheral vascular disease status post transmetatarsal amputation January 2020, and CKD.   Other medical issues include insulin-dependent diabetes with polyneuropathy, hypertension, mild COPD, peripheral vascular disease status post transmetatarsal amputation earlier in January 2020, and a bicuspid aortic valve noted on echocardiogram with no aortic stenosis.  Doppler study did not reveal evidence of DVT, but did identify enlarged lymph nodes in the right groin. Largest measuring 3.5 x 1.6 x 1.3 cm. He was due to be seen on follow up on 01/04/2019 but was a no show.  We have rescheduled him for today.   He denies any chest pain or shortness of breath.  He states that his leg looks better than it did when he called our office originally.  He denies pain in the leg.  He continues to be edematous however.  Past Medical History:  Diagnosis Date   Allergy    Arthritis    Bronchitis    Cataract    Chronic kidney disease    Claudication ([place])    right foot ray resection   Colon polyps    hyperplastic   COPD (chronic obstructive pulmonary disease) (HCC)    Diabetes mellitus    Genital warts    Gout    Hyperlipidemia    Hypertension    Osteomyelitis of third toe of right foot ([place])    Pneumonia    Status post amputation of toe of right foot ([place]) 09/24/2016     Past Surgical History:  Procedure Laterality Date   AMPUTATION Right 01/31/2016   Procedure: Right 2nd Toe Amputation;  Surgeon: Newt Minion, MD;  Location: [facility];  Service: Orthopedics;  Laterality: Right;   AMPUTATION Right 07/08/2018   Procedure: RIGHT TRANSMETATARSAL AMPUTATION;  Surgeon: Newt Minion, MD;  Location: [facility];  Service: Orthopedics;  Laterality: Right;   CATARACT EXTRACTION     right eye   COLONOSCOPY     CORONARY STENT INTERVENTION N/A 07/12/2018   Procedure: CORONARY STENT INTERVENTION;  Surgeon: Troy Sine, MD;  Location: [facility];  Service: Cardiovascular;  Laterality: N/A;   CORONARY/GRAFT ACUTE MI REVASCULARIZATION N/A 07/12/2018   Procedure: Coronary/Graft Acute MI Revascularization;  Surgeon: Troy Sine, MD;  Location: [facility];  Service: Cardiovascular;  Laterality: N/A;   I&D EXTREMITY  04/11/2012   Procedure: IRRIGATION AND DEBRIDEMENT EXTREMITY;  Surgeon: Wylene Simmer, MD;  Location: [facility];  Service: Orthopedics;  Laterality: Right;   LEFT HEART CATH AND CORONARY ANGIOGRAPHY N/A 07/12/2018   Procedure: LEFT HEART CATH AND CORONARY ANGIOGRAPHY;  Surgeon: Troy Sine, MD;  Location: [facility];  Service: Cardiovascular;  Laterality: N/A;   Surgery left great toe     Tear ducts bilateral eyes     TRANSMETATARSAL AMPUTATION Right 07/08/2018     Current Outpatient Medications  Medication Sig Dispense Refill  acetaminophen (TYLENOL) 500 MG tablet Take 500 mg by mouth 2 (two) times daily as needed for headache (pain).      aspirin 81 MG chewable tablet Chew 81 mg by mouth daily.     atorvastatin (LIPITOR) 80 MG tablet Take 1 tablet (80 mg total) by mouth daily at 6 PM. 30 tablet 11   baclofen (LIORESAL) 10 MG tablet Take 10 mg by mouth 2 (two) times daily as needed for muscle spasms.     Cholecalciferol (VITAMIN D3) 50 MCG (2000 UT) TABS Take 2,000 Units by mouth daily.     doxycycline (VIBRAMYCIN) 100  MG capsule Take 1 capsule (100 mg total) by mouth 2 (two) times daily. 28 capsule 1   empagliflozin (JARDIANCE) 25 MG TABS tablet Take 12.5 mg by mouth daily.     furosemide (LASIX) 20 MG tablet Take 60 mg by mouth 3 (three) times daily.      insulin aspart (NOVOLOG FLEXPEN) 100 UNIT/ML FlexPen Inject 40 Units into the skin 3 (three) times daily with meals.     insulin glargine (LANTUS) 100 UNIT/ML injection Inject 40 Units into the skin 2 (two) times daily.     lisinopril (ZESTRIL) 5 MG tablet Take 5 mg by mouth daily.     metFORMIN (GLUCOPHAGE-XR) 500 MG 24 hr tablet Take 250 mg by mouth 2 (two) times a day.      metoprolol tartrate (LOPRESSOR) 25 MG tablet Take 0.5 tablets (12.5 mg total) by mouth 2 (two) times daily. (Patient taking differently: Take 25 mg by mouth daily. ) 60 tablet 11   nitroGLYCERIN (NITROSTAT) 0.4 MG SL tablet Place 1 tablet (0.4 mg total) under the tongue every 5 (five) minutes x 3 doses as needed for chest pain. 25 tablet 11   rosuvastatin (CRESTOR) 40 MG tablet Take 40 mg by mouth daily.      sildenafil (VIAGRA) 100 MG tablet Take 100 mg by mouth daily as needed for erectile dysfunction.     ticagrelor (BRILINTA) 90 MG TABS tablet Take 1 tablet (90 mg total) by mouth 2 (two) times daily. (Patient taking differently: Take 90 mg by mouth daily. ) 60 tablet 11   No current facility-administered medications for this visit.     Allergies:   Codeine    Social History:  The patient  reports that he has been smoking cigars and cigarettes. He started smoking about 55 years ago. He has a 14.40 pack-year smoking history. He has quit using smokeless tobacco. He reports current alcohol use. He reports that he does not use drugs.   Family History:  The patient's family history includes Diabetes in his mother; Heart failure in his father; Stroke in his mother.    ROS: All other systems are reviewed and negative. Unless otherwise mentioned in H&P    PHYSICAL  EXAM: VS:  BP 104/62 (BP Location: Left Arm, Patient Position: Sitting, Cuff Size: Normal)    Pulse 75    Temp 98.1 F (36.7 C)    Ht 5\' 10"  (1.778 m)    Wt 185 lb (83.9 kg)    BMI 26.54 kg/m  , BMI Body mass index is 26.54 kg/m. GEN: Well nourished, well developed, in no acute distress HEENT: normal Neck: no JVD, carotid bruits, or masses Cardiac: RRR; 1/6 systolic murmurs, rubs, or gallops, Respiratory:  Clear to auscultation bilaterally, normal work of breathing GI: soft, nontender, nondistended, + BS MS: no deformity or atrophy.  Right foot toes amputated, he has an open sore  at the distal segment of the right foot stump, clear drainage is noted.  He has 3+ to 2+ pitting edema to the knees.  Cracking of the skin with shiny skin appearance in other areas.  Skin is warm but not erythematous.  He feels no pain with palpation.  He is able to to bear weight on the leg. Skin: warm and dry, no rash Neuro:  Strength and sensation are intact Psych: euthymic mood, full affect   EKG: Not completed on this office visit.  Recent Labs: 07/12/2018: TSH 1.131 10/18/2018: ALT 21; BUN 12; Creatinine, Ser 1.18; Hemoglobin 11.7; Platelets 139; Potassium 4.3; Sodium 140    Lipid Panel    Component Value Date/Time   CHOL 112 10/18/2018 1612   CHOL 159 10/21/2017 0902   TRIG 220 (H) 10/18/2018 1612   HDL 46 10/18/2018 1612   HDL 50 10/21/2017 0902   CHOLHDL 2.4 10/18/2018 1612   VLDL 44 (H) 10/18/2018 1612   LDLCALC NOT CALCULATED 10/18/2018 1612   LDLCALC 65 10/21/2017 0902   LDLDIRECT 78 09/11/2011 1503      Wt Readings from Last 3 Encounters:  01/05/19 185 lb (83.9 kg)  12/31/18 170 lb (77.1 kg)  12/29/18 170 lb (77.1 kg)      Other studies Reviewed: Doppler Ultrasound 12/30/2018 Summary: Right: No evidence of deep vein thrombosis in the lower extremity. No indirect evidence of obstruction proximal to the inguinal ligament. No cystic structure found in the popliteal fossa. Ultrasound  characteristics of enlarged lymph nodes are noted in  the groin. Two large lymph nodes seen in groin. Largest measuring 3.5 x 1.6 x 1.3 cm. Left: No evidence of common femoral vein obstruction.   ASSESSMENT AND PLAN:  1.  Probable cellulitis of the right foot: We will begin the patient on Keflex 500 mg p.o. twice daily for 10 days.  Will dress the open sore on the distal end of his right foot.  It does have some clear drainage.  There is no odor at this time.  He will [facility]up with [facility] for further assessment and management.  He does have large lymph nodes in the right groin area which may be indicative of lower extremity infection versus questionable issues with the colon.  This will need to be further evaluated.  Consider CEA and CT scan of the abdomen for further evaluation of right groin lymph nodes.  On palpation of the right groin he does not have any pain.  2.  Hypertension: Hypotensive today on initial evaluation.  The patient is on furosemide 60 mg daily.  Continue lisinopril 5 mg for renal protection.   3. PAD: Post transmetatarsal amputation in January 2020.  He is followed for lower extremity edema and has had Doppler ultrasound to rule DVT which was negative.  He was noted to have enlarged lymph nodes in the right groin.  This will need to be investigated further.  4.  Insulin-dependent diabetes: He will need to be followed by [facility] family medicine for ongoing management.   Current medicines are reviewed at length with the patient today.    Labs/ tests ordered today include: BMET, Hgb A1c., CBC.   Phill Myron. [place] Pugh, ANP, AACC   01/05/2019 2:49 PM    [facility] [address] Office [phone] Fax [phone]

## 2019-01-05 ENCOUNTER — Other Ambulatory Visit: Payer: Self-pay

## 2019-01-05 ENCOUNTER — Encounter: Payer: Self-pay | Admitting: Adult Health

## 2019-01-05 ENCOUNTER — Ambulatory Visit (INDEPENDENT_AMBULATORY_CARE_PROVIDER_SITE_OTHER): Payer: No Typology Code available for payment source | Admitting: Adult Health

## 2019-01-05 VITALS — BP 104/62 | HR 75 | Temp 98.1°F | Ht 70.0 in | Wt 185.0 lb

## 2019-01-05 DIAGNOSIS — L03115 Cellulitis of right lower limb: Secondary | ICD-10-CM

## 2019-01-05 DIAGNOSIS — Z79899 Other long term (current) drug therapy: Secondary | ICD-10-CM

## 2019-01-05 DIAGNOSIS — E1140 Type 2 diabetes mellitus with diabetic neuropathy, unspecified: Secondary | ICD-10-CM | POA: Diagnosis not present

## 2019-01-05 DIAGNOSIS — I10 Essential (primary) hypertension: Secondary | ICD-10-CM | POA: Diagnosis not present

## 2019-01-05 DIAGNOSIS — Z794 Long term (current) use of insulin: Secondary | ICD-10-CM

## 2019-01-05 DIAGNOSIS — I739 Peripheral vascular disease, unspecified: Secondary | ICD-10-CM

## 2019-01-05 MED ORDER — CEPHALEXIN 500 MG PO CAPS: 500.0000 mg | ORAL_CAPSULE | Freq: Two times a day (BID) | ORAL | 0 refills | Status: DC

## 2019-01-05 NOTE — Patient Instructions (Addendum)
Medication Instructions:  DECEASE- Lasix 40 mg daily  If you need a refill on your cardiac medications before your next appointment, please call your pharmacy.  Labwork: CBC, BMP and HgB A1C HERE IN OUR OFFICE AT [facility] You will NOT need to fast   Take the provided lab slips with you to the lab for your blood draw.   When you have your labs (blood work) drawn today and your tests are completely normal, you will receive your results only by MyChart Message (if you have MyChart) -OR-  A paper copy in the mail.  If you have any lab test that is abnormal or we need to change your treatment, we will call you to review these results.  Testing/Procedures: None Ordered  Follow-Up: . Your physician recommends that you schedule a follow-up appointment in: Dr Gwenlyn Found 3 Months   At [facility], you and your health needs are our priority.  As part of our continuing mission to provide you with exceptional heart care, we have created designated Provider Care Teams.  These Care Teams include your primary Cardiologist (physician) and Advanced Practice Providers (APPs -  Physician Assistants and Nurse Practitioners) who all work together to provide you with the care you need, when you need it.  Thank you for choosing [facility] at [facility]!!

## 2019-01-06 ENCOUNTER — Telehealth: Payer: Self-pay | Admitting: Orthopedic Surgery

## 2019-01-06 ENCOUNTER — Telehealth: Payer: Self-pay

## 2019-01-06 LAB — BASIC METABOLIC PANEL
BUN/Creatinine Ratio: 13 (ref 10–24)
BUN: 15 mg/dL (ref 8–27)
CO2: 26 mmol/L (ref 20–29)
Calcium: 9.2 mg/dL (ref 8.6–10.2)
Chloride: 97 mmol/L (ref 96–106)
Creatinine, Ser: 1.19 mg/dL (ref 0.76–1.27)
GFR calc Af Amer: 75 mL/min/{1.73_m2} (ref >59)
GFR calc non Af Amer: 65 mL/min/{1.73_m2} (ref >59)
Glucose: 205 mg/dL — ABNORMAL HIGH (ref 65–99)
Potassium: 4.2 mmol/L (ref 3.5–5.2)
Sodium: 137 mmol/L (ref 134–144)

## 2019-01-06 LAB — HEMOGLOBIN A1C
Est. average glucose Bld gHb Est-mCnc: 171 mg/dL
Hgb A1c MFr Bld: 7.6 % — ABNORMAL HIGH (ref 4.8–5.6)

## 2019-01-06 LAB — CBC
Hematocrit: 35.7 % — ABNORMAL LOW (ref 37.5–51.0)
Hemoglobin: 12.4 g/dL — ABNORMAL LOW (ref 13.0–17.7)
MCH: 29.9 pg (ref 26.6–33.0)
MCHC: 34.7 g/dL (ref 31.5–35.7)
MCV: 86 fL (ref 79–97)
Platelets: 116 10*3/uL — ABNORMAL LOW (ref 150–450)
RBC: 4.15 x10E6/uL (ref 4.14–5.80)
RDW: 13.4 % (ref 11.6–15.4)
WBC: 3.4 10*3/uL (ref 3.4–10.8)

## 2019-01-06 NOTE — Telephone Encounter (Signed)
Patient authorization form along with office notes were faxed today on 01/06/2019 for verification. Patient next appt is scheduled for 01/14/2019 in our office. FYI

## 2019-01-06 NOTE — Telephone Encounter (Signed)
Is this something that we do? Last week when he was here his auth had run out and someone had called to obtain a new one. What do I need to do for the pt to be seen here in our office?

## 2019-01-06 NOTE — Telephone Encounter (Signed)
Patient called asked for a call back concerning obtainging an auth for him through the [place]. The fax# is [phone]   The number to contact patient is [phone]

## 2019-01-07 ENCOUNTER — Telehealth: Payer: Self-pay | Admitting: Adult Health

## 2019-01-07 NOTE — Telephone Encounter (Signed)
New Message  Patient is returning call in reference to his lab work. Please call to discuss.

## 2019-01-14 ENCOUNTER — Ambulatory Visit: Payer: Non-veteran care | Admitting: Physician Assistant

## 2019-01-14 ENCOUNTER — Other Ambulatory Visit: Payer: Self-pay

## 2019-01-14 ENCOUNTER — Ambulatory Visit (INDEPENDENT_AMBULATORY_CARE_PROVIDER_SITE_OTHER): Payer: Medicare HMO | Admitting: Family Medicine

## 2019-01-14 DIAGNOSIS — R600 Localized edema: Secondary | ICD-10-CM

## 2019-01-14 NOTE — Progress Notes (Signed)
Date of Visit: 01/14/2019   CC: right leg swelling   HPI:  Adam Ray is a 63 year old male with a past history of PAD, DM,CAD and 6 months post transmetatarsal amputation presenting to the clinic today  with a 3 to 4 month history of right leg swelling. The swelling has always been present but it has been markedly increased in the past 3 to 4 months. There has been no associated pain and he reports the swelling is only in the right leg. The swelling gets worse if he stands for more than 4 hours and it goes down with rest and with leg elevation. On fluid pill and antibiotics that he thinks should be helping but he has not seen any improvement. He denies ulcerations, oozing, bleeding, redness or new wounds of the right leg. No pain in the leg. Just swollen. No shortness of breath or chest pain. No blood in stools or changes to stools.  He was seen on 07/01 via telemedicine by cardiology for this and had doppler u/s of RLE which revealed no DVT but they did identify lymphadenopathy in the right groin. He was seen in person by cardiology on 7/7 and was prescribed Keflex for a possible cellulitis of the right leg.  Denies fevers, weight loss, night sweats.   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 2.5 years ago patient had declined further workup. Mass persists today.   PMHX:   Medical Hx:  PAD  STEMI involving LCX- 01/20  COPD  T2DM  Tobacco abuse   ROS: See HPI.  PHYSICAL EXAM: BP 110/70   Pulse 71   SpO2 96%  Gen: Well appearing male, oriented 3X. No acute distress.  HEENT:  No cervical LAD.  Resp: normal work of breathing  Abd: Soft, non-distended abdomen. Normoactive bowel sounds.  Ext: 3-4+ Edema in the right leg below the Ray. Chronic venous stasis changes of discoloration and flaking of skin on right leg. GU: Right inguinal lymphadenopathy, nontender to palpation. No L inguinal lymphadenopathy. Testicles with 3 distinct round  components, appears to possibly have a mass off the L testicle. nontender to palpation. Patient's wife Adam Ray and medical student Adam Ray both present in room during GU examination.  ASSESSMENT/PLAN:  Edema leg Adam Ray is 63 year old man with a PMHx of CAD, PAD, and DM presenting with 3 to 4 month history right lower leg edema. The leg is without redness, fever, ulceration and oozing on physical exam. DVT was ruled out by doppler on 12/30/18. Patient is on a course of Keflex for a potential cellulitis of the leg but the leg shows no improvement. Given the unilateral leg edema and right inguinal LAD, concern for some compressive process in the pelvis. Could be related to his chronic testicular mass for which he has previously declined workup (in past was demonstrated to be hydrocele). Discussed recommendation with patient that we should obtain imaging to further evaluate. Recommend CT abdomen and pelvis. 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 Advised he schedule appointment with [facility] doctor ASAP to get the CT scan ordered. If he has any trouble doing this, he will contact me and I will order the CT. Offered to speak with [facility] doctor if needed.  FOLLOW UP: [facility]  Adam Ray MS3, Medical Student   Patient seen along with MS3 student Adam Ray. I personally evaluated this patient along with the student, and  verified all aspects of the history, physical exam, and medical decision making as documented by the student. I agree with the student's documentation and have made all necessary edits.  Chrisandra Netters, MD [place]

## 2019-01-14 NOTE — Assessment & Plan Note (Addendum)
Adam Ray is 63 year old man with a PMHx of CAD, PAD, and DM presenting with 3 to 4 month history right lower leg edema. The leg is without redness, fever, ulceration and oozing on physical exam. DVT was ruled out by doppler on 12/30/18. Patient is on a course of Keflex for a potential cellulitis of the leg but the leg shows no improvement. Given the unilateral leg edema and right inguinal LAD, concern for some compressive process in the pelvis. Could be related to his chronic testicular mass for which he has previously declined workup (in past was demonstrated to be hydrocele). Discussed recommendation with patient that we should obtain imaging to further evaluate. Recommend CT abdomen and pelvis. 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 Advised he schedule appointment with [facility] doctor ASAP to get the CT scan ordered. If he has any trouble doing this, he will contact me and I will order the CT. Offered to speak with [place] doctor if needed.

## 2019-01-14 NOTE — Patient Instructions (Signed)
[facility] see [facility] and get CT scan of your abdomen and pelvis Call me if you have trouble getting in there and I will get you set up for CT scan  Be well, Dr. [name]

## 2019-01-20 ENCOUNTER — Telehealth: Payer: Self-pay

## 2019-01-20 DIAGNOSIS — I889 Nonspecific lymphadenitis, unspecified: Secondary | ICD-10-CM

## 2019-01-20 DIAGNOSIS — N5089 Other specified disorders of the male genital organs: Secondary | ICD-10-CM

## 2019-01-20 DIAGNOSIS — R600 Localized edema: Secondary | ICD-10-CM

## 2019-01-20 NOTE — Telephone Encounter (Signed)
Order changed to with contrast only. Please let Radiology and patient know. Same instructions- hold lisinopril on day of procedure. Dorris Singh, MD  Family Medicine Teaching Service

## 2019-01-20 NOTE — Telephone Encounter (Signed)
Patient called nurse line stating he no longer wants to have CT done at the [facility] hospital. Patient stated he would like to have it done here. Please advise.

## 2019-01-20 NOTE — Telephone Encounter (Signed)
Called Centralized Scheduling and was informed that it is exposure to too much radiation to do with and without contrast.  Suggest that order be changed to with only.    Please advise.  Ozella Almond, [place]

## 2019-01-20 NOTE — Telephone Encounter (Signed)
Reviewed prior office notes. Unilateral R leg edema with adenopathy plus likely hydrocele. Per notes, CT abdomen/pelvis requested and recommended.   Last creatinine normal. No contrast allergies. CT ordered for [facility].   Please call patient and let him know the test has been ordered. He should hold his lisinopril the day of the procedure. Please help the patient to schedule the CT.   Let me know if he has questions.  Thank you, Dorris Singh, MD  [facility] Medicine Teaching Service

## 2019-01-21 ENCOUNTER — Telehealth: Payer: Self-pay

## 2019-01-21 NOTE — Telephone Encounter (Signed)
LVM for patient to call office concerning his appointment.  Adam Ray, [place]

## 2019-01-21 NOTE — Telephone Encounter (Signed)
Attempted to call patient to inform him of appointment at [facility] for a CT Abdomen Pelvis with contrast. Scheduled for 01/27/2019 at 1630 with a show time of 1615.  Patient is to stop Metformin per scheduler and Lisinopril per Dr. Owens Shark on day of procedure.  Patient is to pick up contrast at hospital prior to procedure and drink one bottle at 1430 and the second bottle at 1530.  Patient is to be NPO 4 hours prior to procedure as well.  I will call patient again as there was no answer and no voice mail.  Ozella Almond, [place]

## 2019-01-21 NOTE — Telephone Encounter (Signed)
Pt informed.  Adam Ray, CMA  

## 2019-01-25 ENCOUNTER — Encounter: Payer: Self-pay | Admitting: Orthopedic Surgery

## 2019-01-25 ENCOUNTER — Ambulatory Visit (INDEPENDENT_AMBULATORY_CARE_PROVIDER_SITE_OTHER): Payer: No Typology Code available for payment source | Admitting: Orthopedic Surgery

## 2019-01-25 ENCOUNTER — Telehealth: Payer: Self-pay | Admitting: Orthopedic Surgery

## 2019-01-25 VITALS — Ht 70.0 in | Wt 180.0 lb

## 2019-01-25 DIAGNOSIS — Z89431 Acquired absence of right foot: Secondary | ICD-10-CM | POA: Diagnosis not present

## 2019-01-25 DIAGNOSIS — I87323 Chronic venous hypertension (idiopathic) with inflammation of bilateral lower extremity: Secondary | ICD-10-CM | POA: Diagnosis not present

## 2019-01-25 DIAGNOSIS — E1142 Type 2 diabetes mellitus with diabetic polyneuropathy: Secondary | ICD-10-CM | POA: Diagnosis not present

## 2019-01-25 NOTE — Progress Notes (Signed)
Office Visit Note   Patient: Adam Ray           Date of Birth: 12-18-1955           MRN: [id number] Visit Date: 01/25/2019              Requested by: [name], [address],  [place] [address] PCP: [name], MD  Chief Complaint  Patient presents with  . Right Foot - Follow-up    07/2018 Right Transmet  . Left Leg - Follow-up, Edema  . Right Leg - Edema, Follow-up      HPI: Patient is a 63 year old gentleman presents in follow-up for transmetatarsal amputation on the right.  Patient has had significant venous stasis swelling in both legs he underwent a Dynaflex compression wrap at the last appointment.  Patient states that he is wearing medical compression stockings but he currently only has knee-high loosefitting cotton socks.  Assessment & Plan: Visit Diagnoses:  1. Status post transmetatarsal amputation of foot, right ([place])   2. Diabetic polyneuropathy associated with type 2 diabetes mellitus ([place])   3. Idiopathic chronic venous hypertension of both lower extremities with inflammation     Plan: Patient was given a new prescription for an extra-large medical compression stocking.  Discussed that his main problem is venous insufficiency and he needs appropriate compression for this to be resolved wearing the cotton socks will not work.  Patient was placed in a Dynaflex compression wrap to the right lower extremity and we will follow-up in a week.  Follow-Up Instructions: Return in about 1 week (around 02/01/2019).   Ortho Exam  Patient is alert, oriented, no adenopathy, well-dressed, normal affect, normal respiratory effort. Examination patient has doughy pitting edema of both lower extremities these are not tender to palpation there is no signs or symptoms of a DVT.  He has significant venous insufficiency of both lower extremities.  Patient states that he is going to get a CT scan of his legs to determine the cause of the swelling.   Patient states he is undergone a cardiac as well as a renal work-up.  Patient's transmetatarsal amputation is well-healed there is no drainage no open wound no signs of infection.  Imaging: No results found. No images are attached to the encounter.  Labs: Lab Results  Component Value Date   HGBA1C 7.6 (H) 01/05/2019   HGBA1C 6.9 (H) 10/18/2018   HGBA1C 7.0 (H) 07/12/2018   ESRSEDRATE 8 01/29/2016   ESRSEDRATE 9 04/11/2012   CRP 2.6 (H) 01/29/2016   CRP 0.5 02/28/2014   REPTSTATUS 02/03/2016 FINAL 01/29/2016   REPTSTATUS 02/03/2016 FINAL 01/29/2016   GRAMSTAIN  04/11/2012    NO WBC SEEN RARE SQUAMOUS EPITHELIAL CELLS PRESENT MODERATE GRAM POSITIVE COCCI IN PAIRS RARE GRAM NEGATIVE RODS   GRAMSTAIN  04/11/2012    NO WBC SEEN RARE SQUAMOUS EPITHELIAL CELLS PRESENT MODERATE GRAM POSITIVE COCCI IN PAIRS RARE GRAM NEGATIVE RODS   CULT NO GROWTH 5 DAYS 01/29/2016   CULT NO GROWTH 5 DAYS 01/29/2016   LABORGA NO GROWTH 2 DAYS 07/19/2014     Lab Results  Component Value Date   ALBUMIN 3.5 10/18/2018   ALBUMIN 3.5 07/12/2018   ALBUMIN 3.9 10/21/2017    No results found for: MG No results found for: VD25OH  No results found for: PREALBUMIN CBC EXTENDED Latest Ref Rng & Units 01/05/2019 10/18/2018 07/13/2018  WBC 3.4 - 10.8 x10E3/uL 3.4 3.2(L) 4.3  RBC 4.14 - 5.80  x10E6/uL 4.15 4.08(L) 4.24  HGB 13.0 - 17.7 g/dL 12.4(L) 11.7(L) 12.6(L)  HCT 37.5 - 51.0 % 35.7(L) 37.4(L) 38.1(L)  PLT 150 - 450 x10E3/uL 116(L) 139(L) 131(L)  NEUTROABS 1.7 - 7.7 K/uL - - -  LYMPHSABS 0.7 - 4.0 K/uL - - -     Body mass index is 25.83 kg/m.  Orders:  No orders of the defined types were placed in this encounter.  No orders of the defined types were placed in this encounter.    Procedures: No procedures performed  Clinical Data: No additional findings.  ROS:  All other systems negative, except as noted in the HPI. Review of Systems  Objective: Vital Signs: Ht 5\' 10"  (1.778 m)    Wt 180 lb (81.6 kg)   BMI 25.83 kg/m   Specialty Comments:  No specialty comments available.  PMFS History: Patient Active Problem List   Diagnosis Date Noted  . Edema leg 12/29/2018  . STEMI (ST elevation myocardial infarction) ([place]) 07/12/2018  . STEMI involving left circumflex coronary artery ([place]) 07/12/2018  . Acute ST elevation myocardial infarction (STEMI) ([place])   . Status post transmetatarsal amputation of foot, right ([place]) 07/08/2018  . Limited mobility 05/12/2018  . Gingival foreign body 07/22/2017  . Anxiety state 06/02/2017  . Coated tongue 04/03/2017  . Leg swelling 01/29/2017  . Idiopathic chronic venous hypertension of both lower extremities with inflammation 09/24/2016  . Onychomycosis 09/24/2016  . Testicular mass 04/18/2016  . Thrush 04/18/2016  . Soft tissue swelling   . Osteomyelitis ([place]) 01/29/2016  . Diabetic polyneuropathy associated with type 2 diabetes mellitus ([place])   . Type 2 diabetes mellitus with neurologic complication, with long-term current use of insulin ([place]) 12/08/2015  . Left shoulder pain 08/10/2015  . Pulmonary nodule 02/01/2015  . Screening for colon cancer 01/09/2015  . Lumbar back pain 03/21/2014  . Screening for STD (sexually transmitted disease) 12/15/2013  . Depression 12/09/2013  . Lightheadedness 11/19/2013  . Warts, genital 08/20/2013  . 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 Right leg pain 04/27/2013  . Diabetic retinopathy ([place]) 01/28/2013  . Visit for well man health check 11/19/2010  . Sleep apnea 09/06/2010  . 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 LEG EDEMA 03/03/2009  . Essential hypertension 11/09/2008  . COPD, mild ([place]) 10/06/2006  . SICKLE-CELL TRAIT 04/26/2006  . ERECTILE DYSFUNCTION 04/26/2006  . Tobacco abuse 04/26/2006  . PAD (peripheral artery disease) ([place]) 04/26/2006  . ALLERGIC RHINITIS 04/26/2006   Past Medical History:  Diagnosis Date  . Allergy   . Arthritis   .  Bronchitis   . Cataract   . Chronic kidney disease   . Claudication [facility])    right foot ray resection  . Colon polyps    hyperplastic  . COPD (chronic obstructive pulmonary disease) ([place])   . Diabetes mellitus   . Genital warts   . Gout   . Hyperlipidemia   . Hypertension   . Osteomyelitis of third toe of right foot ([place])   . Pneumonia   . Status post amputation of toe of right foot ([place]) 09/24/2016    Family History  Problem Relation Age of Onset  . Diabetes Mother   . Stroke Mother   . Heart failure Father   . Colon cancer Neg Hx   . Esophageal cancer Neg Hx   . Rectal cancer Neg Hx   . Stomach cancer Neg Hx     Past Surgical History:  Procedure Laterality Date  . AMPUTATION Right  01/31/2016   Procedure: Right 2nd Toe Amputation;  Surgeon: Newt Minion, MD;  Location: [place];  Service: Orthopedics;  Laterality: Right;  . AMPUTATION Right 07/08/2018   Procedure: RIGHT TRANSMETATARSAL AMPUTATION;  Surgeon: Newt Minion, MD;  Location: [place];  Service: Orthopedics;  Laterality: Right;  . CATARACT EXTRACTION     right eye  . COLONOSCOPY    . CORONARY STENT INTERVENTION N/A 07/12/2018   Procedure: CORONARY STENT INTERVENTION;  Surgeon: Troy Sine, MD;  Location: [place] [facility];  Service: Cardiovascular;  Laterality: N/A;  . CORONARY/GRAFT ACUTE MI REVASCULARIZATION N/A 07/12/2018   Procedure: Coronary/Graft Acute MI Revascularization;  Surgeon: Troy Sine, MD;  Location: [place] CV LAB;  Service: Cardiovascular;  Laterality: N/A;  . I&D EXTREMITY  04/11/2012   Procedure: IRRIGATION AND DEBRIDEMENT EXTREMITY;  Surgeon: Wylene Simmer, MD;  Location: [name];  Service: Orthopedics;  Laterality: Right;  . LEFT HEART CATH AND CORONARY ANGIOGRAPHY N/A 07/12/2018   Procedure: LEFT HEART CATH AND CORONARY ANGIOGRAPHY;  Surgeon: Troy Sine, MD;  Location: [place] CV LAB;  Service: Cardiovascular;  Laterality: N/A;  . Surgery left great toe    . Tear ducts bilateral  eyes    . TRANSMETATARSAL AMPUTATION Right 07/08/2018   Social History   Occupational History  . Occupation: disabled  Tobacco Use  . Smoking status: Current Every Day Smoker    Packs/day: 0.30    Years: 48.00    Pack years: 14.40    Types: Cigars, Cigarettes    Start date: 07/02/1963  . Smokeless tobacco: Former Systems developer  . Tobacco comment: wants to use electric cigarettes.  not interested in pills, worried about chantix side effects  Substance and Sexual Activity  . Alcohol use: Yes    Alcohol/week: 0.0 standard drinks    Comment: occassional use  . Drug use: No  . Sexual activity: Not on file

## 2019-01-25 NOTE — Telephone Encounter (Signed)
Patient called and requested for you to call him back today.  CB#[phone].  Thank you.

## 2019-01-25 NOTE — Telephone Encounter (Signed)
I called patient. He asked for me to speak with his wife and explain what was discussed in appt today. She advised he could talk to me. I went over information from appt with patient again. He expressed understanding.

## 2019-01-27 ENCOUNTER — Other Ambulatory Visit: Payer: Self-pay

## 2019-01-27 ENCOUNTER — Encounter: Payer: Self-pay | Admitting: Podiatry

## 2019-01-27 ENCOUNTER — Ambulatory Visit (HOSPITAL_COMMUNITY)
Admission: RE | Admit: 2019-01-27 | Discharge: 2019-01-27 | Disposition: A | Discharge status: Home / Self Care | Payer: Medicare HMO | Source: Ambulatory Visit | Attending: Family Medicine | Admitting: Family Medicine

## 2019-01-27 ENCOUNTER — Ambulatory Visit (INDEPENDENT_AMBULATORY_CARE_PROVIDER_SITE_OTHER): Payer: Medicare HMO | Admitting: Podiatry

## 2019-01-27 VITALS — Temp 98.6°F

## 2019-01-27 DIAGNOSIS — R600 Localized edema: Secondary | ICD-10-CM | POA: Diagnosis not present

## 2019-01-27 DIAGNOSIS — E1142 Type 2 diabetes mellitus with diabetic polyneuropathy: Secondary | ICD-10-CM | POA: Diagnosis not present

## 2019-01-27 DIAGNOSIS — B351 Tinea unguium: Secondary | ICD-10-CM | POA: Diagnosis not present

## 2019-01-27 DIAGNOSIS — K429 Umbilical hernia without obstruction or gangrene: Secondary | ICD-10-CM | POA: Diagnosis not present

## 2019-01-27 DIAGNOSIS — M79675 Pain in left toe(s): Secondary | ICD-10-CM

## 2019-01-27 DIAGNOSIS — I2510 Atherosclerotic heart disease of native coronary artery without angina pectoris: Secondary | ICD-10-CM | POA: Insufficient documentation

## 2019-01-27 DIAGNOSIS — N5089 Other specified disorders of the male genital organs: Secondary | ICD-10-CM | POA: Diagnosis not present

## 2019-01-27 DIAGNOSIS — I889 Nonspecific lymphadenitis, unspecified: Secondary | ICD-10-CM | POA: Diagnosis not present

## 2019-01-27 DIAGNOSIS — Z899 Acquired absence of limb, unspecified: Secondary | ICD-10-CM

## 2019-01-27 DIAGNOSIS — I700 Atherosclerosis of aorta: Secondary | ICD-10-CM | POA: Diagnosis not present

## 2019-01-27 DIAGNOSIS — R590 Localized enlarged lymph nodes: Secondary | ICD-10-CM | POA: Diagnosis not present

## 2019-01-27 MED ORDER — IOHEXOL 300 MG/ML  SOLN
100.0000 mL | Freq: Once | INTRAMUSCULAR | Status: AC | PRN
  Administered 2019-01-27: 18:00:00 100 mL via INTRAVENOUS

## 2019-01-27 NOTE — Progress Notes (Signed)
Complaint:  Visit Type: Patient returns to my office for continued preventative foot care services. Complaint: Patient states" my nails have grown long and thick and become painful to walk and wear shoes" Patient has been diagnosed with DM with  TMA right foot. The patient presents for preventative foot care services. No changes to ROS  Podiatric Exam: Vascular: dorsalis pedis and posterior tibial pulses are  Weakly  palpable . Capillary return is immediate. Temperature gradient is WNL. Skin turgor WNL  Sensorium: Diminished  Semmes Weinstein monofilament test left foot.. Normal tactile sensation bilaterally. Nail Exam: Pt has thick disfigured discolored nails with subungual debris 1-5 left foot. Ulcer Exam: There is no evidence of ulcer or pre-ulcerative changes or infection. Orthopedic Exam: Muscle tone and strength are WNL. No limitations in general ROM. No crepitus or effusions noted. Foot type and digits show no abnormalities. Bony prominences are unremarkable. Skin: No Porokeratosis. No infection or ulcers  Diagnosis:  Onychomycosis, , Pain in right toe, pain in left toes  Treatment & Plan Procedures and Treatment: Consent by patient was obtained for treatment procedures.   Debridement of mycotic and hypertrophic toenails, 1 through 5 left and clearing of subungual debris. No ulceration, no infection noted.  Return Visit-Office Procedure: Patient instructed to return to the office for a follow up visit 3 months for continued evaluation and treatment.    Gardiner Barefoot DPM

## 2019-02-01 ENCOUNTER — Ambulatory Visit: Payer: Medicare HMO | Admitting: Orthopedic Surgery

## 2019-02-02 ENCOUNTER — Telehealth: Payer: Self-pay | Admitting: *Deleted

## 2019-02-02 NOTE — Telephone Encounter (Signed)
Pt lm on vm @ 4:59 yesterday asking for a callback from Dr. [name].  Attempted to call back this am, lmovm for callback. Christen Bame, CMA

## 2019-02-03 NOTE — Telephone Encounter (Signed)
Called patient & reviewed CT results. No obvious cause for why is leg is swollen. Discussed next steps in workup - recommend ultrasound of testicles. Patient does not want to get that through [facility] right now because it costs him money. Encouraged him to see his [facility] doctor to see about getting it done there. He states he will call his doctor.  In any case, patient states leg swelling is getting better. I do still think an u/s is prudent to rule out testicular pathology (had recommended that years ago but he declined workup at that time). He is going to contact the [facility] [place]  Patient appreciative [name], MD

## 2019-02-04 NOTE — Telephone Encounter (Signed)
Pt is calling to inform Dr. [name] that he contacted his doctor through the [place] and they said they should be contacting her soon.

## 2019-02-05 ENCOUNTER — Ambulatory Visit: Payer: Medicare HMO | Admitting: Family

## 2019-02-09 ENCOUNTER — Telehealth: Payer: Self-pay | Admitting: Family Medicine

## 2019-02-09 NOTE — Telephone Encounter (Signed)
Pt is calling and would like to talk to Dr. [name]. He said he needs to discuss some medical issues as well as his referral for his leg. He would not go into detail and will not see another doctor.   Dr. [name] doesn't have any virtual appointments until 08/25 and the pt said he needs to speak with her as soon as possible.   Please call to discuss.

## 2019-02-10 ENCOUNTER — Telehealth: Payer: Self-pay | Admitting: Family Medicine

## 2019-02-10 NOTE — Telephone Encounter (Signed)
Late entry - called and spoke with Josie. Informed her of my wanting to have patient get a scrotal/testicular ultrasound. She says they will arrange this through the [place]. [name], MD

## 2019-02-10 NOTE — Telephone Encounter (Signed)
Pt is calling to speak to Dr, [name] about a private issues. jw

## 2019-02-10 NOTE — Telephone Encounter (Signed)
Called [facility] and spoke with him. He is trying to connect me with his [place] doctor, Dr. Radford Pax. I called the [facility] on his behalf ([phone]) to try to reach Dr. Radford Pax. They sent her a message. I provided our clinic number for them to call me back and reach me.  [name], MD

## 2019-02-10 NOTE — Telephone Encounter (Signed)
Called patient. He is now saying he is not sure he wants to get the ultrasound of his scrotum. Advised I recommend it to evaluate the testicular mass. He says his leg swelling is better. He is going to think about it and decide whether he wants to get the ultrasound [name], MD

## 2019-02-10 NOTE — Telephone Encounter (Signed)
Adam Ray, Dr. Landis Gandy nurse, called nurse line to speak with patients PCP. She is returning her call. You can reach Adam Ray at [phone] and ask to speak with her specifically.

## 2019-03-15 ENCOUNTER — Telehealth: Payer: Self-pay | Admitting: Orthopedic Surgery

## 2019-03-15 NOTE — Telephone Encounter (Signed)
Note has been faxed to number provided.

## 2019-03-15 NOTE — Telephone Encounter (Signed)
Adam Ray, from [facility] called requesting the office notes from the July 27th office visit be faxed to her at [phone].  Thank you.

## 2019-03-25 DIAGNOSIS — R6889 Other general symptoms and signs: Secondary | ICD-10-CM | POA: Diagnosis not present

## 2019-03-25 LAB — HM DIABETES EYE EXAM

## 2019-03-30 ENCOUNTER — Telehealth: Payer: Self-pay | Admitting: Family Medicine

## 2019-03-30 NOTE — Telephone Encounter (Signed)
Pt wanted Fax # and Address.  Returned call and gave info.  Christen Bame, CMA

## 2019-03-30 NOTE — Telephone Encounter (Signed)
The patient says he needs to speak to you 'today about [facility]'.  That is all he would say. His number is [phone].  He insists he wants to speak to Dr. [name] today.

## 2019-04-01 ENCOUNTER — Telehealth: Payer: Self-pay

## 2019-04-01 NOTE — Telephone Encounter (Signed)
Called patient. Provided our fax number. He is going to have the [place] fax [place] records. [name], MD

## 2019-04-01 NOTE — Telephone Encounter (Signed)
Patient would like a Rx for new prosthetic shoes.  Stated that last pair did not fit due to brace?  Cb# is [phone].  Please advise.  Thank you.

## 2019-04-02 NOTE — Telephone Encounter (Signed)
I called and tried to reach pt. No answer to ask pt where the order needs to go for the shoes. Can you follow up

## 2019-04-05 NOTE — Telephone Encounter (Signed)
I tried to call patient on numbers listed and could not reach him on cell. I tried house number and lvm for patient to notify us on where he would like for Rx to be sent to for prosthetic shoes.

## 2019-04-07 NOTE — Telephone Encounter (Signed)
Telemedicine 6/30; OV 7/7

## 2019-04-08 ENCOUNTER — Encounter: Payer: Self-pay | Admitting: Family Medicine

## 2019-04-13 ENCOUNTER — Ambulatory Visit (INDEPENDENT_AMBULATORY_CARE_PROVIDER_SITE_OTHER): Payer: Medicare HMO | Admitting: Orthopedic Surgery

## 2019-04-13 ENCOUNTER — Other Ambulatory Visit: Payer: Self-pay

## 2019-04-13 ENCOUNTER — Ambulatory Visit: Payer: Self-pay

## 2019-04-13 ENCOUNTER — Encounter: Payer: Self-pay | Admitting: Orthopedic Surgery

## 2019-04-13 DIAGNOSIS — Z89431 Acquired absence of right foot: Secondary | ICD-10-CM

## 2019-04-13 DIAGNOSIS — M79671 Pain in right foot: Secondary | ICD-10-CM

## 2019-04-13 MED ORDER — NABUMETONE 500 MG PO TABS: 500.0000 mg | ORAL_TABLET | Freq: Every day | ORAL | 1 refills | Status: DC

## 2019-04-13 NOTE — Progress Notes (Signed)
Office Visit Note   Patient: Adam Ray           Date of Birth: 02-Dec-1955           MRN: [id number] Visit Date: 04/13/2019              Requested by: [name], [address],  [place] [address] PCP: [name], MD  Chief Complaint  Patient presents with  . Right Foot - Pain      HPI: Patient is a 63 year old gentleman who presents today complaining of right foot pain.  To the plantar aspect of his foot he is complaining of a grinding bone-on-bone sensation with weightbearing.  He states he is recently received orthotics however the extra-depth shoes and orthotics that he is wearing appear to be broken down the left foot he has cut the toe box out he states that his foot was rubbing and painful.  He has not had any recent injuries no warmth no erythema  States that he follows with the [place] but does follow with [facility] for fabrication of his orthotics and inserts  Assessment & Plan: Visit Diagnoses:  1. Pain in right foot   2. Status post transmetatarsal amputation of foot, right ([place])     Plan: Provided an order for Relafen for his pain.  Will fax this to the [facility].  Also will provide an order for new custom orthotics and extra-depth shoes.  Discussed his double upright brace.  Patient prefers not to use this.  Follow-Up Instructions: Return in about 4 weeks (around 05/11/2019), or if symptoms worsen or fail to improve.   Ortho Exam  Patient is alert, oriented, no adenopathy, well-dressed, normal affect, normal respiratory effort. On examination of right lower extremity the leg has pitting 2+ edema. No erythema. No warmth. No weeping. Foot with preview transmetatarsal amputation which is well healed. No callus no skin breakdown. Moderate swelling to foot. No bony tenderness.   Imaging: No results found. No images are attached to the encounter.  Labs: Lab Results  Component Value Date   HGBA1C 7.6 (H) 01/05/2019   HGBA1C 6.9 (H) 10/18/2018   HGBA1C 7.0 (H) 07/12/2018   ESRSEDRATE 8 01/29/2016   ESRSEDRATE 9 04/11/2012   CRP 2.6 (H) 01/29/2016   CRP 0.5 02/28/2014   REPTSTATUS 02/03/2016 FINAL 01/29/2016   REPTSTATUS 02/03/2016 FINAL 01/29/2016   GRAMSTAIN  04/11/2012    NO WBC SEEN RARE SQUAMOUS EPITHELIAL CELLS PRESENT MODERATE GRAM POSITIVE COCCI IN PAIRS RARE GRAM NEGATIVE RODS   GRAMSTAIN  04/11/2012    NO WBC SEEN RARE SQUAMOUS EPITHELIAL CELLS PRESENT MODERATE GRAM POSITIVE COCCI IN PAIRS RARE GRAM NEGATIVE RODS   CULT NO GROWTH 5 DAYS 01/29/2016   CULT NO GROWTH 5 DAYS 01/29/2016   LABORGA NO GROWTH 2 DAYS 07/19/2014     Lab Results  Component Value Date   ALBUMIN 3.5 10/18/2018   ALBUMIN 3.5 07/12/2018   ALBUMIN 3.9 10/21/2017    No results found for: MG No results found for: VD25OH  No results found for: PREALBUMIN CBC EXTENDED Latest Ref Rng & Units 01/05/2019 10/18/2018 07/13/2018  WBC 3.4 - 10.8 x10E3/uL 3.4 3.2(L) 4.3  RBC 4.14 - 5.80 x10E6/uL 4.15 4.08(L) 4.24  HGB 13.0 - 17.7 g/dL 12.4(L) 11.7(L) 12.6(L)  HCT 37.5 - 51.0 % 35.7(L) 37.4(L) 38.1(L)  PLT 150 - 450 x10E3/uL 116(L) 139(L) 131(L)  NEUTROABS 1.7 - 7.7 K/uL - - -  LYMPHSABS 0.7 - 4.0  K/uL - - -     There is no height or weight on file to calculate BMI.  Orders:  Orders Placed This Encounter  Procedures  . XR Foot Complete Right   Meds ordered this encounter  Medications  . nabumetone (RELAFEN) 500 MG tablet    Sig: Take 1 tablet (500 mg total) by mouth daily.    Dispense:  60 tablet    Refill:  1     Procedures: No procedures performed  Clinical Data: No additional findings.  ROS:  All other systems negative, except as noted in the HPI. Review of Systems  Constitutional: Negative for chills and fever.  Cardiovascular: Positive for leg swelling.  Musculoskeletal: Positive for arthralgias and myalgias.  Skin: Negative for color change and wound.    Objective: Vital Signs: There  were no vitals taken for this visit.  Specialty Comments:  No specialty comments available.  PMFS History: Patient Active Problem List   Diagnosis Date Noted  . Edema leg 12/29/2018  . STEMI (ST elevation myocardial infarction) ([place]) 07/12/2018  . STEMI involving left circumflex coronary artery ([place]) 07/12/2018  . Acute ST elevation myocardial infarction (STEMI) ([place])   . Status post transmetatarsal amputation of foot, right ([place]) 07/08/2018  . Limited mobility 05/12/2018  . Gingival foreign body 07/22/2017  . Anxiety state 06/02/2017  . Coated tongue 04/03/2017  . Leg swelling 01/29/2017  . Idiopathic chronic venous hypertension of both lower extremities with inflammation 09/24/2016  . Onychomycosis 09/24/2016  . Testicular mass 04/18/2016  . Thrush 04/18/2016  . Soft tissue swelling   . Osteomyelitis ([place]) 01/29/2016  . Diabetic polyneuropathy associated with type 2 diabetes mellitus ([place])   . Type 2 diabetes mellitus with neurologic complication, with long-term current use of insulin ([place]) 12/08/2015  . Left shoulder pain 08/10/2015  . Pulmonary nodule 02/01/2015  . Screening for colon cancer 01/09/2015  . Lumbar back pain 03/21/2014  . Screening for STD (sexually transmitted disease) 12/15/2013  . Depression 12/09/2013  . Lightheadedness 11/19/2013  . Warts, genital 08/20/2013  . 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 Right leg pain 04/27/2013  . Diabetic retinopathy ([place]) 01/28/2013  . Visit for well man health check 11/19/2010  . Sleep apnea 09/06/2010  . 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 LEG EDEMA 03/03/2009  . Essential hypertension 11/09/2008  . COPD, mild ([place]) 10/06/2006  . SICKLE-CELL TRAIT 04/26/2006  . ERECTILE DYSFUNCTION 04/26/2006  . Tobacco abuse 04/26/2006  . PAD (peripheral artery disease) ([place]) 04/26/2006  . ALLERGIC RHINITIS 04/26/2006   Past Medical History:  Diagnosis Date  . Allergy   . Arthritis   .  Bronchitis   . Cataract   . Chronic kidney disease   . Claudication [facility])    right foot ray resection  . Colon polyps    hyperplastic  . COPD (chronic obstructive pulmonary disease) ([place])   . Diabetes mellitus   . Genital warts   . Gout   . Hyperlipidemia   . Hypertension   . Osteomyelitis of third toe of right foot ([place])   . Pneumonia   . Status post amputation of toe of right foot ([place]) 09/24/2016    Family History  Problem Relation Age of Onset  . Diabetes Mother   . Stroke Mother   . Heart failure Father   . Colon cancer Neg Hx   . Esophageal cancer Neg Hx   . Rectal cancer Neg Hx   . Stomach cancer Neg Hx  Past Surgical History:  Procedure Laterality Date  . AMPUTATION Right 01/31/2016   Procedure: Right 2nd Toe Amputation;  Surgeon: Newt Minion, MD;  Location: [place];  Service: Orthopedics;  Laterality: Right;  . AMPUTATION Right 07/08/2018   Procedure: RIGHT TRANSMETATARSAL AMPUTATION;  Surgeon: Newt Minion, MD;  Location: [place];  Service: Orthopedics;  Laterality: Right;  . CATARACT EXTRACTION     right eye  . COLONOSCOPY    . CORONARY STENT INTERVENTION N/A 07/12/2018   Procedure: CORONARY STENT INTERVENTION;  Surgeon: Troy Sine, MD;  Location: [place] CV LAB;  Service: Cardiovascular;  Laterality: N/A;  . CORONARY/GRAFT ACUTE MI REVASCULARIZATION N/A 07/12/2018   Procedure: Coronary/Graft Acute MI Revascularization;  Surgeon: Troy Sine, MD;  Location: [place] CV LAB;  Service: Cardiovascular;  Laterality: N/A;  . I&D EXTREMITY  04/11/2012   Procedure: IRRIGATION AND DEBRIDEMENT EXTREMITY;  Surgeon: Wylene Simmer, MD;  Location: [place];  Service: Orthopedics;  Laterality: Right;  . LEFT HEART CATH AND CORONARY ANGIOGRAPHY N/A 07/12/2018   Procedure: LEFT HEART CATH AND CORONARY ANGIOGRAPHY;  Surgeon: Troy Sine, MD;  Location: [place] CV LAB;  Service: Cardiovascular;  Laterality: N/A;  . Surgery left great toe    . Tear ducts bilateral  eyes    . TRANSMETATARSAL AMPUTATION Right 07/08/2018   Social History   Occupational History  . Occupation: disabled  Tobacco Use  . Smoking status: Current Every Day Smoker    Packs/day: 0.30    Years: 48.00    Pack years: 14.40    Types: Cigars, Cigarettes    Start date: 07/02/1963  . Smokeless tobacco: Former Systems developer  . Tobacco comment: wants to use electric cigarettes.  not interested in pills, worried about chantix side effects  Substance and Sexual Activity  . Alcohol use: Yes    Alcohol/week: 0.0 standard drinks    Comment: occassional use  . Drug use: No  . Sexual activity: Not on file

## 2019-04-20 ENCOUNTER — Ambulatory Visit: Payer: Non-veteran care | Admitting: Cardiovascular Disease

## 2019-04-28 ENCOUNTER — Ambulatory Visit: Payer: Medicare HMO | Admitting: Podiatry

## 2019-05-05 DIAGNOSIS — Z20828 Contact with and (suspected) exposure to other viral communicable diseases: Secondary | ICD-10-CM | POA: Diagnosis not present

## 2019-05-19 ENCOUNTER — Telehealth: Payer: Self-pay | Admitting: Cardiovascular Disease

## 2019-05-19 NOTE — Telephone Encounter (Signed)
The patient requests a copy of his records and medicine history  be sent to his PCP Dr. Radford Pax at the [facility].  The fax number is [phone]

## 2019-05-24 ENCOUNTER — Encounter: Payer: Self-pay | Admitting: Family Medicine

## 2019-05-26 ENCOUNTER — Other Ambulatory Visit: Payer: Self-pay

## 2019-05-26 ENCOUNTER — Encounter: Payer: Self-pay | Admitting: Cardiovascular Disease

## 2019-05-26 ENCOUNTER — Ambulatory Visit (INDEPENDENT_AMBULATORY_CARE_PROVIDER_SITE_OTHER): Payer: No Typology Code available for payment source | Admitting: Cardiovascular Disease

## 2019-05-26 ENCOUNTER — Telehealth: Payer: Self-pay | Admitting: Cardiovascular Disease

## 2019-05-26 DIAGNOSIS — E1169 Type 2 diabetes mellitus with other specified complication: Secondary | ICD-10-CM | POA: Insufficient documentation

## 2019-05-26 DIAGNOSIS — I739 Peripheral vascular disease, unspecified: Secondary | ICD-10-CM | POA: Diagnosis not present

## 2019-05-26 DIAGNOSIS — E782 Mixed hyperlipidemia: Secondary | ICD-10-CM

## 2019-05-26 DIAGNOSIS — E785 Hyperlipidemia, unspecified: Secondary | ICD-10-CM | POA: Insufficient documentation

## 2019-05-26 DIAGNOSIS — Q231 Congenital insufficiency of aortic valve: Secondary | ICD-10-CM | POA: Diagnosis not present

## 2019-05-26 DIAGNOSIS — Z720 Tobacco use: Secondary | ICD-10-CM

## 2019-05-26 DIAGNOSIS — I10 Essential (primary) hypertension: Secondary | ICD-10-CM

## 2019-05-26 NOTE — Assessment & Plan Note (Signed)
History of bicuspid aortic valve without stenosis by recent echo in January of this year.

## 2019-05-26 NOTE — Telephone Encounter (Signed)
Called pt's wife [name] and she states that she does not know why he is call or what the "fax number" is.

## 2019-05-26 NOTE — Telephone Encounter (Signed)
Patient wants to speak with [facility] and refuses to tell why.

## 2019-05-26 NOTE — Assessment & Plan Note (Signed)
History of PAD status post right transmetatarsal amputation by Dr. Sharol Given 07/08/2018 for osteomyelitis.

## 2019-05-26 NOTE — Telephone Encounter (Signed)
Attempted to reach the patient. Was unable to leave a message 

## 2019-05-26 NOTE — Telephone Encounter (Signed)
LMVM-left detailed message we are not admitting pt to bring visitors to their appointments. We CB if any further questions.

## 2019-05-26 NOTE — Assessment & Plan Note (Signed)
History of essential hypertension blood pressure measured today 120/60.  He is on lisinopril and metoprolol.

## 2019-05-26 NOTE — Telephone Encounter (Signed)
[facility] He will call back on Monday

## 2019-05-26 NOTE — Progress Notes (Signed)
05/26/2019 Marcello Moores   [birth year]  [id number]  Primary Physician [name] Delorse Limber, MD Primary Cardiologist: Lorretta Harp MD Lupe Carney, [place]  HPI:  Adam Ray is a 63 y.o.  married Caucasian male father of 2 children, grandfather of 8 grandchildren who I saw him remotely for peripheral vascular valuation.  I last saw him for a telephone virtual telephone telemedicine visit 10/06/2018. He was recently discharged from [facility] after having had a "STEMI" for an occluded circumflex coronary artery on 07/12/2018.  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  His risk factors include recently discontinue tobacco abuse having smoked the majority of his life and diabetes as well as hyperlipidemia.  He does have a family history for heart disease with a father and brother both of who had myocardial infarction's.  He never had an MI in the past.  Dr. Claiborne Billings placed a synergy drug-eluting stent in his circumflex.  The LAD and RCA were free of significant disease.  He was discharged home on aspirin and Brilinta.   Since I saw him 6 months ago he continues to do well.  He did see Jory Sims, NP in the office 01/05/2019 and was asymptomatic at that time.  He remains on aspirin and Brilinta.  He denies chest pain or shortness of breath.  Current Meds  Medication Sig  . acetaminophen (TYLENOL) 500 MG tablet Take 500 mg by mouth 2 (two) times daily as needed for headache (pain).   [name] aspirin 81 MG chewable tablet Chew 81 mg by mouth daily.  [name] atorvastatin (LIPITOR) 80 MG tablet Take 1 tablet (80 mg total) by mouth daily at 6 PM.  . baclofen (LIORESAL) 10 MG tablet Take 10 mg by mouth 2 (two) times daily as needed for muscle spasms.  . Cholecalciferol (VITAMIN D3) 50 MCG (2000 UT) TABS Take 2,000 Units by mouth daily.  . empagliflozin (JARDIANCE) 25 MG TABS tablet Take 12.5 mg by mouth daily.  . furosemide (LASIX) 20 MG tablet Take 60 mg  by mouth 2 (two) times daily.   . insulin aspart (NOVOLOG FLEXPEN) 100 UNIT/ML FlexPen Inject 40 Units into the skin 3 (three) times daily with meals.  . insulin glargine (LANTUS) 100 UNIT/ML injection Inject 40 Units into the skin 2 (two) times daily.  [name] lisinopril (ZESTRIL) 5 MG tablet Take 5 mg by mouth daily.  . metFORMIN (GLUCOPHAGE-XR) 500 MG 24 hr tablet Take 250 mg by mouth 2 (two) times a day.   . metoprolol tartrate (LOPRESSOR) 25 MG tablet Take 0.5 tablets (12.5 mg total) by mouth 2 (two) times daily.  . nabumetone (RELAFEN) 500 MG tablet Take 1 tablet (500 mg total) by mouth daily.  . nitroGLYCERIN (NITROSTAT) 0.4 MG SL tablet Place 1 tablet (0.4 mg total) under the tongue every 5 (five) minutes x 3 doses as needed for chest pain.  . rosuvastatin (CRESTOR) 40 MG tablet Take 40 mg by mouth daily.   . sildenafil (VIAGRA) 100 MG tablet Take 100 mg by mouth daily as needed for erectile dysfunction.  . ticagrelor (BRILINTA) 90 MG TABS tablet Take 1 tablet (90 mg total) by mouth 2 (two) times daily. (Patient taking differently: Take 90 mg by mouth daily. )     Allergies  Allergen Reactions  . Codeine Other (See Comments)    Heart attack.    Social History   Socioeconomic History  . Marital status: Married  Spouse name: Not on file  . Number of children: 3  . Years of education: Not on file  . Highest education level: Not on file  Occupational History  . Occupation: disabled  Social Needs  . Financial resource strain: Not on file  . Food insecurity    Worry: Not on file    Inability: Not on file  . Transportation needs    Medical: Not on file    Non-medical: Not on file  Tobacco Use  . Smoking status: Current Every Day Smoker    Packs/day: 0.30    Years: 48.00    Pack years: 14.40    Types: Cigars, Cigarettes    Start date: 07/02/1963  . Smokeless tobacco: Former Systems developer  . Tobacco comment: wants to use electric cigarettes.  not interested in pills, worried about  chantix side effects  Substance and Sexual Activity  . Alcohol use: Yes    Alcohol/week: 0.0 standard drinks    Comment: occassional use  . Drug use: No  . Sexual activity: Not on file  Lifestyle  . Physical activity    Days per week: Not on file    Minutes per session: Not on file  . Stress: Not on file  Relationships  . Social Herbalist on phone: Not on file    Gets together: Not on file    Attends religious service: Not on file    Active member of club or organization: Not on file    Attends meetings of clubs or organizations: Not on file    Relationship status: Not on file  . Intimate partner violence    Fear of current or ex partner: Not on file    Emotionally abused: Not on file    Physically abused: Not on file    Forced sexual activity: Not on file  Other Topics Concern  . Not on file  Social History Narrative  . Not on file     Review of Systems: General: negative for chills, fever, night sweats or weight changes.  Cardiovascular: negative for chest pain, dyspnea on exertion, edema, orthopnea, palpitations, paroxysmal nocturnal dyspnea or shortness of breath Dermatological: negative for rash Respiratory: negative for cough or wheezing Urologic: negative for hematuria Abdominal: negative for nausea, vomiting, diarrhea, bright red blood per rectum, melena, or hematemesis Neurologic: negative for visual changes, syncope, or dizziness All other systems reviewed and are otherwise negative except as noted above.    Blood pressure 120/60, pulse 60, height 5\' 10"  (1.778 m), weight 191 lb 12.8 oz (87 kg), SpO2 95 %.  General appearance: alert and no distress Neck: no adenopathy, no carotid bruit, no JVD, supple, symmetrical, trachea midline and thyroid not enlarged, symmetric, no tenderness/mass/nodules Lungs: clear to auscultation bilaterally Heart: regular rate and rhythm, S1, S2 normal, no murmur, click, rub or gallop Extremities: extremities normal,  atraumatic, no cyanosis or edema Pulses: 2+ and symmetric Skin: Skin color, texture, turgor normal. No rashes or lesions Neurologic: Alert and oriented X 3, normal strength and tone. Normal symmetric reflexes. Normal coordination and gait  EKG sinus rhythm at 60 with septal Q waves.  I personally reviewed this EKG.  ASSESSMENT AND PLAN:   Tobacco abuse History of discontinue tobacco abuse the time of his STEMI  Essential hypertension History of essential hypertension blood pressure measured today 120/60.  He is on lisinopril and metoprolol.  BICUSPID AORTIC VALVE History of bicuspid aortic valve without stenosis by recent echo in January of this year.  PAD (peripheral artery disease) (HCC) History of PAD status post right transmetatarsal amputation by Dr. Sharol Given 07/08/2018 for osteomyelitis.  Hyperlipidemia History of hyperlipidemia on atorvastatin with lipid profile performed 10/18/2018 revealed a total cholesterol 112, LDL 29 HDL 46.      Lorretta Harp MD [facility], [facility] 05/26/2019 2:45 PM

## 2019-05-26 NOTE — Telephone Encounter (Signed)
Patient called requesting his wife attend appoint on 05/26/19 at 2:00 PM with him due to him not being able to see well.

## 2019-05-26 NOTE — Telephone Encounter (Signed)
LM2CB 

## 2019-05-26 NOTE — Patient Instructions (Addendum)
Medication Instructions:  No changes *If you need a refill on your cardiac medications before your next appointment, please call your pharmacy*  Lab Work: None ordered If you have labs (blood work) drawn today and your tests are completely normal, you will receive your results only by: [name] MyChart Message (if you have MyChart) OR . A paper copy in the mail If you have any lab test that is abnormal or we need to change your treatment, we will call you to review the results.  Testing/Procedures: None ordered  Follow-Up: At [facility], you and your health needs are our priority.  As part of our continuing mission to provide you with exceptional heart care, we have created designated Provider Care Teams.  These Care Teams include your primary Cardiologist (physician) and Advanced Practice Providers (APPs -  Physician Assistants and Nurse Practitioners) who all work together to provide you with the care you need, when you need it.  Your next appointment:   Follow up in 6 months with Jory Sims, DNP Follow up in 12 months with Dr. Gwenlyn Found

## 2019-05-26 NOTE — Assessment & Plan Note (Signed)
History of hyperlipidemia on atorvastatin with lipid profile performed 10/18/2018 revealed a total cholesterol 112, LDL 29 HDL 46.

## 2019-05-26 NOTE — Telephone Encounter (Signed)
PT CALLED BACK AND HE STATES THAT HE ONLY WANTS TO TALK TO HIS "NURSE" TOLD HIM THAT SHE IS NOT IN AND IS OUT FOR THE HOLIDAY AND THAT I WAS SUPPOSED TO ASK HIM FOR THE FAX NUMBER. THEN HE STATES THAT HE WILL CALL BACK IN 10 MINUTES. I INFORMED HIM THAT OUR PHONE TURNS OFF AT 5 AND TO MAKE SURE TO CALL BEFORE THEN. HE STATES ILL MAKE SURE TO CALL BACK BEFORE THEN.

## 2019-05-26 NOTE — Assessment & Plan Note (Signed)
History of discontinue tobacco abuse the time of his STEMI

## 2019-05-31 ENCOUNTER — Other Ambulatory Visit (INDEPENDENT_AMBULATORY_CARE_PROVIDER_SITE_OTHER): Payer: No Typology Code available for payment source

## 2019-05-31 ENCOUNTER — Ambulatory Visit (INDEPENDENT_AMBULATORY_CARE_PROVIDER_SITE_OTHER): Payer: No Typology Code available for payment source | Admitting: Family Medicine

## 2019-05-31 ENCOUNTER — Other Ambulatory Visit: Payer: Self-pay

## 2019-05-31 ENCOUNTER — Telehealth: Payer: Self-pay | Admitting: Cardiovascular Disease

## 2019-05-31 ENCOUNTER — Encounter: Payer: Self-pay | Admitting: Family Medicine

## 2019-05-31 VITALS — BP 132/68 | HR 68 | Wt 197.4 lb

## 2019-05-31 DIAGNOSIS — I87323 Chronic venous hypertension (idiopathic) with inflammation of bilateral lower extremity: Secondary | ICD-10-CM

## 2019-05-31 DIAGNOSIS — Q231 Congenital insufficiency of aortic valve: Secondary | ICD-10-CM

## 2019-05-31 DIAGNOSIS — E1142 Type 2 diabetes mellitus with diabetic polyneuropathy: Secondary | ICD-10-CM | POA: Diagnosis not present

## 2019-05-31 DIAGNOSIS — E1140 Type 2 diabetes mellitus with diabetic neuropathy, unspecified: Secondary | ICD-10-CM | POA: Diagnosis not present

## 2019-05-31 DIAGNOSIS — I739 Peripheral vascular disease, unspecified: Secondary | ICD-10-CM | POA: Diagnosis not present

## 2019-05-31 DIAGNOSIS — Z794 Long term (current) use of insulin: Secondary | ICD-10-CM | POA: Diagnosis not present

## 2019-05-31 DIAGNOSIS — Z23 Encounter for immunization: Secondary | ICD-10-CM | POA: Diagnosis not present

## 2019-05-31 DIAGNOSIS — I10 Essential (primary) hypertension: Secondary | ICD-10-CM

## 2019-05-31 LAB — POCT GLYCOSYLATED HEMOGLOBIN (HGB A1C): Hemoglobin A1C: 7.6 % — AB (ref 4.0–5.6)

## 2019-05-31 MED ORDER — ZOSTER VAC RECOMB ADJUVANTED 50 MCG/0.5ML IM SUSR: 0.5000 mL | Freq: Once | INTRAMUSCULAR | 1 refills | Status: AC

## 2019-05-31 NOTE — Telephone Encounter (Signed)
New Message      Pt is needing all of his medications and medical information sent too fax # [phone] to Dr Radford Pax at a Clinic in [facility]    Please call back

## 2019-05-31 NOTE — Telephone Encounter (Signed)
Called patient, LVM advising that information was sent to Dr.Turner via epic fax. Left call back number if any other questions.

## 2019-05-31 NOTE — Patient Instructions (Signed)
You will get a tetanus booster from [place] today. I sent in a prescription to your pharmacy for the shingles vaccine.  I would wait a few days after the tetanus shot.  Remember the shingles vaccine might cause side effects.  You will need a second dose of the vaccine 2-6 months after the first dose. Your A1C is stable and pretty good.  We might try to get it a little better.  Make sure you are taking all your diabetes medicines.  There was one we talked about that you were not sure you were taking.

## 2019-05-31 NOTE — Telephone Encounter (Signed)
Patient is calling wanting to know who came and got him for his EKG on 05/26/19 and would like to speak with them.

## 2019-05-31 NOTE — Telephone Encounter (Signed)
Called patient, tried to assist patient- he refused to tell me what question he had regarding his EKG, he wanted to speak to who did the EKG, I advised that we have different people assisting in our office and asked him multiple times how I could assist him. He stated he had a 'point' that he wanted to make, then said he wanted to speak with Dr.Berry personally, I advised that I could have his nurse contact him back- patient stated that he only wants to speak with Dr.Berry, I tried to explain to patient that the doctors are in clinic- and his nurse would contact him to discuss, patient does not want to speak to anyone but Dr.Berry regarding unknown reason. will route to nurse.

## 2019-06-01 ENCOUNTER — Telehealth: Payer: Self-pay

## 2019-06-01 ENCOUNTER — Telehealth: Payer: Self-pay | Admitting: Family Medicine

## 2019-06-01 ENCOUNTER — Encounter: Payer: Self-pay | Admitting: Family Medicine

## 2019-06-01 NOTE — Progress Notes (Signed)
   Subjective:    Patient ID: Adam Ray, male    DOB: June 21, 1956, 63 y.o.   MRN: [id number]  HPI  FU several issues. #1 DM - A1C is stable at 7.6.  He did not bring in meds and is unsure if he is taking Jardiance.   2. Wants immunizations.  He had a flu shot. 05/17/19.  Thought he was due for a peumovax but not yet.  Has not had shingles vaccine.  Is due for a tetanus booster. 3. [facility]  I could not find any specifics in care everywhere.  We decided to hold off since I did not know the specific blood test they wanted.     4. No other health maint issues at this time.   Review of Systems     Objective:   Physical Exam Lungs clear, Cardiac RRR without m or g Abd benign.       Assessment & Plan:

## 2019-06-01 NOTE — Telephone Encounter (Signed)
LM2CB concerning EKG question for Dr Gwenlyn Found.

## 2019-06-01 NOTE — Assessment & Plan Note (Signed)
With his diabetes care through the [place] and with A1C reasonable (<8), no changes except to check that he is taking his jardiance.

## 2019-06-01 NOTE — Telephone Encounter (Signed)
Patient calling concerning his appt yesterday with Dr. Andria Frames. Patient request Hensel give him a call when he gets a moment. He says it's concerning a medication they discussed at his appt on 05-31-2019.  Routing to white team since he saw Dr. Andria Frames.

## 2019-06-02 ENCOUNTER — Telehealth: Payer: Self-pay | Admitting: Family Medicine

## 2019-06-02 NOTE — Telephone Encounter (Signed)
See separate phone note.  Adam Ray was on his med list and he was not taking.  He will order from the [place].  No action needed on my part.

## 2019-06-02 NOTE — Telephone Encounter (Signed)
Contacted pt and he stated that he just wanted to notify Dr. Andria Frames that he has not been taking his Jardience and that he is going to contact the [place] to get this medication filled.April Zimmerman Rumple, CMA

## 2019-06-02 NOTE — Telephone Encounter (Signed)
Patient called again today insisting that Dr. Andria Frames call him back today regarding the message he left yesterday. I informed him I would leave another message. Thanks.

## 2019-06-02 NOTE — Telephone Encounter (Signed)
Called. Spoke to patient and verified no action needed on my part.

## 2019-06-02 NOTE — Telephone Encounter (Signed)
Attempted to return call.  LM.  I will try again.

## 2019-06-03 NOTE — Telephone Encounter (Signed)
Let's try to call him tomorrow while we're in the office

## 2019-06-04 ENCOUNTER — Telehealth: Payer: Self-pay

## 2019-06-04 NOTE — Telephone Encounter (Signed)
Called pt about concerns with EKG last week. Advised pt that Dr Gwenlyn Found was willing to talk to him about concerns. Asked the pt what the concern was so that I could pass that to Dr Gwenlyn Found. Pt stated that his wife wanted to know who performed the EKG last week. Informed pt that there are many different assist in the office and asked if I could help answer his questions. He stated that his wife wanted to know who it was because they would not let her come back during the appt. Educated pt about [facility]'s policy with visitors at the time and how he needs to get approval prior to appt to get spouse to come during appt. Pt verbalized understanding. Apologized to pt about the confusion. Asked pt if he still wanted to talk to Dr Gwenlyn Found. Pt denied.

## 2019-07-20 ENCOUNTER — Encounter: Payer: Self-pay | Admitting: Family Medicine

## 2019-07-20 ENCOUNTER — Ambulatory Visit (INDEPENDENT_AMBULATORY_CARE_PROVIDER_SITE_OTHER): Payer: No Typology Code available for payment source | Admitting: Family Medicine

## 2019-07-20 ENCOUNTER — Other Ambulatory Visit: Payer: Self-pay

## 2019-07-20 VITALS — BP 120/72 | HR 70 | Wt 201.2 lb

## 2019-07-20 DIAGNOSIS — Q383 Other congenital malformations of tongue: Secondary | ICD-10-CM

## 2019-07-20 MED ORDER — FLUCONAZOLE 100 MG PO TABS: 100.0000 mg | ORAL_TABLET | Freq: Every day | ORAL | 0 refills | Status: DC

## 2019-07-20 MED ORDER — FAMOTIDINE 20 MG PO TABS: 20.0000 mg | ORAL_TABLET | Freq: Two times a day (BID) | ORAL | 1 refills | Status: DC

## 2019-07-20 NOTE — Progress Notes (Signed)
Date of Visit: 07/20/2019   HPI:  Adam Ray presents today for follow up. He has 2 issues he wants to discuss today.  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 Has been treated in the past with nystatin, also with diflucan. It is unclear whehter he has seen ENT, despite being referred in the past. He gets a lot of his care at the [place]. He brings in an empty old container of diflucan and requests a refill of this to treat his tongue.  Phlegm in throat - for a while now has had sensation of phlegm in his throat. Coughs/clears his throat to get it up. No fevers. ottherwise feeling well.  Followed at [facility] for his diabetes.  PHYSICAL EXAM: BP 120/72   Pulse 70   Wt 201 lb 3.2 oz (91.3 kg)   SpO2 99%   BMI 28.87 kg/m  Gen: no acute distress, pleasant, cooperative HEENT: normocephalic, atraumatic, moist mucous membranes, oropharynx clear and moist. Tongue with some whitish appearing papillae but no discernable exudates or plaques Lungs: normal work of breathing, clear to auscultation bilaterally  Neuro: speech normal, grossly nonfocal  ASSESSMENT/PLAN:  Health maintenance:  -current on HM items  Tongue issue - unclear etiology. Will rx 1 week of diflucan since patient is certain this has helped in the past. Refer back to ENT for evaluation.  Phlegm in throat - suspect acid reflux. Start with trial of H2 blocker pepcid. Follow up if not improving.  [place]. [name], [place]

## 2019-07-20 NOTE — Patient Instructions (Signed)
It was great to see you again today!  Sent in the pills for your tongue Referring to ENT doctor  Sent in medication to help with the phlegm in your throat - let's see if this helps  Be well, Dr. [name]

## 2019-07-23 ENCOUNTER — Telehealth: Payer: Self-pay | Admitting: Family Medicine

## 2019-07-23 NOTE — Telephone Encounter (Signed)
Patient says he needs Dr. [name] to call him concerning his new medication: Pepcid. He says he is not taking any of it until [place] calls him back. He demanded [name] to call him back today and I told him our [id number] hour policy and he states 'this is an emergency.' Offered to let him speak to nurse but he refused this.

## 2019-07-26 NOTE — Telephone Encounter (Signed)
Pt calls again to check status. Jessica Fleeger, CMA 

## 2019-07-26 NOTE — Telephone Encounter (Signed)
Called Adam Ray and answered his questions. He was asking what the pepcid was for. Explained it is to treat acid reflux, in the event this is causing his sensation of having phlegm in his throat. Patient appreciative.  [name], MD

## 2019-08-29 ENCOUNTER — Ambulatory Visit: Payer: Medicare HMO | Attending: Internal Medicine

## 2019-08-29 DIAGNOSIS — Z23 Encounter for immunization: Secondary | ICD-10-CM | POA: Insufficient documentation

## 2019-08-29 NOTE — Progress Notes (Signed)
   Covid-19 Vaccination Clinic  Name:  Adam Ray    MRN: [id number] DOB: 1956-06-29  08/29/2019  Adam Ray was observed post Covid-19 immunization for 15 minutes without incidence. He was provided with Vaccine Information Sheet and instruction to access the V-Safe system.   Adam Ray was instructed to call 911 with any severe reactions post vaccine: [name] Difficulty breathing  . Swelling of your face and throat  . A fast heartbeat  . A bad rash all over your body  . Dizziness and weakness    Immunizations Administered    Name Date Dose VIS Date Route   Pfizer COVID-19 Vaccine 08/29/2019  3:55 PM 0.3 mL 06/11/2019 Intramuscular   Manufacturer: [place]   Lot: [id number]   [place]: [id number]

## 2019-09-14 ENCOUNTER — Telehealth: Payer: Self-pay

## 2019-09-14 NOTE — Telephone Encounter (Signed)
Message left on voicemail requesting an order to be faxed to [phone] for [place] ED shoes custom orthotics and carbon fiber plate for right transmet amputation and bilateral foot diabetic neuropathy. This has been written and will fax today.

## 2019-09-21 ENCOUNTER — Telehealth: Payer: Self-pay | Admitting: Cardiovascular Disease

## 2019-09-21 NOTE — Telephone Encounter (Signed)
Patient states that he received a letter and a VM about scheduling an appt ASAP. I did not see any notes about someone calling him and the recall is for May. Patient would like to speak with Dr. Gwenlyn Found or his nurse before scheduling appt in May.

## 2019-09-21 NOTE — Telephone Encounter (Signed)
Call returned to Pt.  Per Pt he was calling back to schedule his follow up appointment in May.  Please call Pt to schedule follow up.

## 2019-09-28 ENCOUNTER — Ambulatory Visit: Payer: No Typology Code available for payment source | Attending: Internal Medicine

## 2019-09-28 DIAGNOSIS — Z23 Encounter for immunization: Secondary | ICD-10-CM

## 2019-09-28 NOTE — Progress Notes (Signed)
   Covid-19 Vaccination Clinic  Name:  Adam Ray    MRN: [id number] DOB: [birth year]  09/28/2019  Adam Ray was observed post Covid-19 immunization for 15 minutes without incident. He was provided with Vaccine Information Sheet and instruction to access the V-Safe system.   Adam Ray was instructed to call 911 with any severe reactions post vaccine: [name] Difficulty breathing  . Swelling of face and throat  . A fast heartbeat  . A bad rash all over body  . Dizziness and weakness   Immunizations Administered    Name Date Dose VIS Date Route   Pfizer COVID-19 Vaccine 09/28/2019 12:04 PM 0.3 mL 06/11/2019 Intramuscular   Manufacturer: [place]   Lot: [id number]   [place]: [id number]

## 2019-10-24 ENCOUNTER — Other Ambulatory Visit: Payer: Self-pay | Admitting: Cardiovascular Disease

## 2019-10-26 NOTE — Telephone Encounter (Signed)
Rx(s) sent to pharmacy electronically.  

## 2019-10-28 ENCOUNTER — Ambulatory Visit (INDEPENDENT_AMBULATORY_CARE_PROVIDER_SITE_OTHER): Payer: No Typology Code available for payment source | Admitting: Family Medicine

## 2019-10-28 ENCOUNTER — Other Ambulatory Visit: Payer: Self-pay

## 2019-10-28 VITALS — BP 126/64 | HR 70 | Wt 194.0 lb

## 2019-10-28 DIAGNOSIS — G4762 Sleep related leg cramps: Secondary | ICD-10-CM | POA: Diagnosis not present

## 2019-10-28 DIAGNOSIS — M7989 Other specified soft tissue disorders: Secondary | ICD-10-CM

## 2019-10-28 DIAGNOSIS — R600 Localized edema: Secondary | ICD-10-CM | POA: Diagnosis not present

## 2019-10-28 DIAGNOSIS — E1142 Type 2 diabetes mellitus with diabetic polyneuropathy: Secondary | ICD-10-CM | POA: Diagnosis not present

## 2019-10-28 LAB — POCT GLYCOSYLATED HEMOGLOBIN (HGB A1C): HbA1c, POC (controlled diabetic range): 7.2 % — AB (ref 0.0–7.0)

## 2019-10-28 NOTE — Assessment & Plan Note (Addendum)
Nocturnal leg cramps likely due to change in routine and possible dehydration given patient's exertion for the day. Also continue to use either mustard as needed for leg cramps.  BMP with no signs of electrolyte abnormality.  Patient also encouraged to stretch prior to bed. Will see in about two weeks to assess improvement.

## 2019-10-28 NOTE — Patient Instructions (Addendum)
Thank you for coming to see me today. It was a pleasure! Today we talked about:   Please increase your furosemide or Lasix to 3 tablets or 60 mg twice daily.  You may take these when you first wake up in the morning and then around 3 PM in the afternoon so you are not up all night using the bathroom.  If you develop any worsening shortness of breath, chest pain or worsening leg swelling then please do not hesitate to go to the emergency room or come into our office.   Please be sure to keep your follow-up visit with your cardiologist on May 5.  I have scheduled your appointment here to follow-up 1 week later to be sure that your swelling has improved and to discuss your diabetes more.   If you have any questions or concerns, please do not hesitate to call the office at [phone].  Take Care,   [place] Andrell Bergeson, DO

## 2019-10-28 NOTE — Progress Notes (Signed)
SUBJECTIVE:   CHIEF COMPLAINT / HPI:   Adam Ray is a 64 y.o. who presented today with worries of nocturnal right leg "cramping" that occurred two nights ago. He states this has never happened before. The cramping began at 2:30 AM as a pressure sensation in his calf muscle. The sensation progressively became painful and radiated up to the thigh. He further described the feeling as a "pulled" and "tight" muscle. He also described some swelling. He got out of bed and tried to stretch the muscle and "hit" it, but there was no relief. He tried drinking orange juice and eating two peppermint candies since he thought it was his blood sugar, but that did not help either. He then took two spoonfuls of mustard, and the pain went away soon after. He did not take any medications for the pain. The painful episode lasted 45 minutes. The day before the cramping, he was outside lifting 10 bags of mulch and moving bricks around. He states he is unemployed and is not usually that active. Today, he states that he can move his leg normally except for lifting it up high. Denies shortness of breath, chest pain, fevers, nausea, vomiting.   Reports that he was to be taking furosemide 60 mg or 3 tablets but he dropped it down to 1 tablet as it was making him pee too much.  Patient reports a subjective 30 pound weight gain over the past few weeks.  He denies any shortness of breath.  States that yesterday he was outside in the yard working and not experience any dyspnea or chest pain on exertion during that time.  PERTINENT  PMH / PSH: HTN, COPD, PAD, CAD, venous insufficiency, OSA, T2DM  OBJECTIVE:   BP 126/64   Pulse 70   Wt 194 lb (88 kg)   SpO2 97%   BMI 27.84 kg/m    General: well-appearing and conversant in no signs of immediate distress. Weight gain of around 30 pounds presumably due to fluid retention and poor medication adherence  Pulmonary: clear to auscultation bilaterally, no wheezes or  rales  MSK: lower extremity 2+ pitting edema bilaterally  ASSESSMENT/PLAN:   Nocturnal leg cramps Nocturnal leg cramps likely due to change in routine and possible dehydration given patient's exertion for the day. Also continue to use either mustard as needed for leg cramps.  BMP with no signs of electrolyte abnormality.  Patient also encouraged to stretch prior to bed. Will see in about two weeks to assess improvement.  Leg swelling Patient with subjective 30 pound weight gain and significant 3+ pitting bilateral lower extremity edema to knees.  Possibly multifactorial due to history of venous insufficiency, but also appears that patient may have undiagnosed HFpEF given last echo 07/13/2018 showed signs of elevated filling pressures with EF of 65 to 70%.  Patient has cardiologist that he will try to schedule follow-up with.  Given that patient is not having any increased shortness of breath and lungs are clear on exam do not see the utility for obtaining BNP at this time, although it may help in the future with diagnosis if needed.  However this will not change management.  Patient will need to diurese. -Restart furosemide at 60 mg given patient's reported increase in urination with this -Patient to return 5/11 for follow-up and repeat BMP. -Counseled on need for compression stockings -Strict return precautions discussed including developing shortness of breath, chest pain or worsening lower extremity edema -Patient encouraged to weigh himself daily  at the same time and record those weights -   [place] Shirley, [place]

## 2019-10-28 NOTE — Assessment & Plan Note (Deleted)
Adam Ray was counseled on medication adherence to Lasix by taking three pills twice daily. He had stated he was only taking one pill twice daily. Follow up after he sees his cardiologist to assess for improvement in fluid retention and lower extremity edema.

## 2019-10-29 LAB — LIPID PANEL
Chol/HDL Ratio: 3.2 ratio (ref 0.0–5.0)
Cholesterol, Total: 144 mg/dL (ref 100–199)
HDL: 45 mg/dL (ref >39)
LDL Chol Calc (NIH): 62 mg/dL (ref 0–99)
Triglycerides: 231 mg/dL — ABNORMAL HIGH (ref 0–149)
VLDL Cholesterol Cal: 37 mg/dL (ref 5–40)

## 2019-10-29 LAB — BASIC METABOLIC PANEL
BUN/Creatinine Ratio: 15 (ref 10–24)
BUN: 20 mg/dL (ref 8–27)
CO2: 22 mmol/L (ref 20–29)
Calcium: 9.5 mg/dL (ref 8.6–10.2)
Chloride: 106 mmol/L (ref 96–106)
Creatinine, Ser: 1.34 mg/dL — ABNORMAL HIGH (ref 0.76–1.27)
GFR calc Af Amer: 64 mL/min/{1.73_m2} (ref >59)
GFR calc non Af Amer: 56 mL/min/{1.73_m2} — ABNORMAL LOW (ref >59)
Glucose: 249 mg/dL — ABNORMAL HIGH (ref 65–99)
Potassium: 4.6 mmol/L (ref 3.5–5.2)
Sodium: 142 mmol/L (ref 134–144)

## 2019-11-01 NOTE — Assessment & Plan Note (Addendum)
Patient with subjective 30 pound weight gain and significant 3+ pitting bilateral lower extremity edema to knees.  Possibly multifactorial due to history of venous insufficiency, but also appears that patient may have undiagnosed HFpEF given last echo 07/13/2018 showed signs of elevated filling pressures with EF of 65 to 70%.  Patient has cardiologist that he will try to schedule follow-up with.  Given that patient is not having any increased shortness of breath and lungs are clear on exam do not see the utility for obtaining BNP at this time, although it may help in the future with diagnosis if needed.  However this will not change management.  Patient will need to diurese. -Restart furosemide at 60 mg given patient's reported increase in urination with this -Patient to return 5/11 for follow-up and repeat BMP. -Counseled on need for compression stockings -Strict return precautions discussed including developing shortness of breath, chest pain or worsening lower extremity edema -Patient encouraged to weigh himself daily at the same time and record those weights -

## 2019-11-01 NOTE — Progress Notes (Signed)
Cardiology Office Note   Date:  11/03/2019   ID:  Adam Ray, DOB September 22, 1955, MRN [id number]  PCP:  [name], MD  Cardiologist:  Dr. Gwenlyn Found CC: Follow-up   History of Present Illness: Adam Ray is a 64 y.o. male who presents for ongoing assessment and management of PAD, CAD (occluded CX per cath in 07/12/2018), with DES to Cx by Dr. Claiborne Billings. He also had a right transmetatarsal amputation on 07/08/2018 by Dr. Sharol Given for osteomyelitis. He also has a history of tobacco abuse, but has since quit, diabetes, and hyperlipidemia.   He was seen by [facility], by Jacelyn Grip, Medical student on 10/28/2019 with patient complaints of nocturnal leg cramping in the right leg. It began in his right calf and progressed into his right thigh.He tried to stretch it but did not help.[name] He then took two spoonfuls of mustard, and the pain went away soon after. He did not take any medications for the pain. The painful episode lasted 45 minutes. The day before the cramping, he was outside lifting 10 bags of mulch and moving bricks around.  He was also found to have LEE and was counseled on taking lasix as directed, as he was to take it BID and was only taking it daily. BMET, Hgb A1c, and Fasting lipids were ordered. He was to follow up with cardiology. BMET revealed elevated creatinine of 1.34, elevated glucose of 249. Potassium of 4.6. Hgb A1c of 7.2. TG elevated at 231.   I have spoken with him today about his current symptoms.  He is complaining of frequent coughing with phlegm production.  He denies any dyspnea on exertion.  His leg cramps have been controlled.  I have reviewed his labs with him.  He admits to drinking vodka throughout the day that he mixes with Kool-Aid, he also does not adhere to a low-sodium diet.  I have talked with him about alcohol and sugar.  Along with reducing the salt in his diet.  He does not appear to be willing to make significant lifestyle changes.  Past Medical  History:  Diagnosis Date  . Allergy   . Arthritis   . Bronchitis   . Cataract   . Chronic kidney disease   . Claudication [facility])    right foot ray resection  . Colon polyps    hyperplastic  . COPD (chronic obstructive pulmonary disease) ([place])   . Diabetes mellitus   . Genital warts   . Gout   . Hyperlipidemia   . Hypertension   . Osteomyelitis of third toe of right foot ([place])   . Pneumonia   . Status post amputation of toe of right foot ([place]) 09/24/2016    Past Surgical History:  Procedure Laterality Date  . AMPUTATION Right 01/31/2016   Procedure: Right 2nd Toe Amputation;  Surgeon: Newt Minion, MD;  Location: [place];  Service: Orthopedics;  Laterality: Right;  . AMPUTATION Right 07/08/2018   Procedure: RIGHT TRANSMETATARSAL AMPUTATION;  Surgeon: Newt Minion, MD;  Location: [place];  Service: Orthopedics;  Laterality: Right;  . CATARACT EXTRACTION     right eye  . COLONOSCOPY    . CORONARY STENT INTERVENTION N/A 07/12/2018   Procedure: CORONARY STENT INTERVENTION;  Surgeon: Troy Sine, MD;  Location: [place] CV LAB;  Service: Cardiovascular;  Laterality: N/A;  . CORONARY/GRAFT ACUTE MI REVASCULARIZATION N/A 07/12/2018   Procedure: Coronary/Graft Acute MI Revascularization;  Surgeon: Troy Sine, MD;  Location: [place]  CV LAB;  Service: Cardiovascular;  Laterality: N/A;  . I & D EXTREMITY  04/11/2012   Procedure: IRRIGATION AND DEBRIDEMENT EXTREMITY;  Surgeon: Wylene Simmer, MD;  Location: [place];  Service: Orthopedics;  Laterality: Right;  . LEFT HEART CATH AND CORONARY ANGIOGRAPHY N/A 07/12/2018   Procedure: LEFT HEART CATH AND CORONARY ANGIOGRAPHY;  Surgeon: Troy Sine, MD;  Location: [place] CV LAB;  Service: Cardiovascular;  Laterality: N/A;  . Surgery left great toe    . Tear ducts bilateral eyes    . TRANSMETATARSAL AMPUTATION Right 07/08/2018     Current Outpatient Medications  Medication Sig Dispense Refill  . acetaminophen (TYLENOL) 500 MG  tablet Take 500 mg by mouth 2 (two) times daily as needed for headache (pain).     [name] aspirin 81 MG chewable tablet Chew 81 mg by mouth daily.    [name] atorvastatin (LIPITOR) 80 MG tablet Take 1 tablet (80 mg total) by mouth daily at 6 PM. 30 tablet 11  . baclofen (LIORESAL) 10 MG tablet Take 10 mg by mouth 2 (two) times daily as needed for muscle spasms.    . Cholecalciferol (VITAMIN D3) 50 MCG (2000 UT) TABS Take 2,000 Units by mouth daily.    . empagliflozin (JARDIANCE) 25 MG TABS tablet Take 12.5 mg by mouth daily.    . famotidine (PEPCID) 20 MG tablet Take 1 tablet (20 mg total) by mouth 2 (two) times daily. 60 tablet 1  . fluconazole (DIFLUCAN) 100 MG tablet Take 1 tablet (100 mg total) by mouth daily. 7 tablet 0  . furosemide (LASIX) 20 MG tablet Take 60 mg by mouth 2 (two) times daily.     . insulin aspart (NOVOLOG FLEXPEN) 100 UNIT/ML FlexPen Inject 40 Units into the skin 3 (three) times daily with meals.    . insulin glargine (LANTUS) 100 UNIT/ML injection Inject 40 Units into the skin 2 (two) times daily.    [name] lisinopril (ZESTRIL) 5 MG tablet Take 5 mg by mouth daily.    . metFORMIN (GLUCOPHAGE-XR) 500 MG 24 hr tablet Take 250 mg by mouth 2 (two) times a day.     . metoprolol tartrate (LOPRESSOR) 25 MG tablet TAKE 0.5 TABLETS (12.5 MG TOTAL) BY MOUTH 2 (TWO) TIMES DAILY. 90 tablet 1  . nabumetone (RELAFEN) 500 MG tablet Take 1 tablet (500 mg total) by mouth daily. 60 tablet 1  . nitroGLYCERIN (NITROSTAT) 0.4 MG SL tablet Place 1 tablet (0.4 mg total) under the tongue every 5 (five) minutes x 3 doses as needed for chest pain. 25 tablet 11  . sildenafil (VIAGRA) 100 MG tablet Take 100 mg by mouth daily as needed for erectile dysfunction.    . ticagrelor (BRILINTA) 90 MG TABS tablet Take 1 tablet (90 mg total) by mouth 2 (two) times daily. (Patient taking differently: Take 90 mg by mouth daily. ) 60 tablet 11   No current facility-administered medications for this visit.    Allergies:    Codeine    Social History:  The patient  reports that he has been smoking cigars and cigarettes. He started smoking about 56 years ago. He has a 14.40 pack-year smoking history. He has quit using smokeless tobacco. He reports current alcohol use. He reports that he does not use drugs.   Family History:  The patient's family history includes Diabetes in his mother; Heart failure in his father; Stroke in his mother.    ROS: All other systems are reviewed and negative. Unless otherwise mentioned  in H&P    PHYSICAL EXAM: VS:  BP 126/79 (BP Location: Right Arm, Patient Position: Sitting, Cuff Size: Normal)   Pulse 75   Temp 98.4 F (36.9 C)   Ht 5\' 10"  (1.778 m)   Wt 189 lb (85.7 kg)   BMI 27.12 kg/m  , BMI Body mass index is 27.12 kg/m. GEN: Well nourished, well developed, in no acute distress HEENT: normal Neck: no JVD, bilateral soft carotid bruits, or masses Cardiac: RRR; split S1 ,no murmurs, rubs, or gallops, 2+ ankle edema  Respiratory: Bibasilar crackles more on the left than on the right. GI: soft, nontender, nondistended, + BS MS: no deformity or atrophy Skin: warm and dry, no rash Neuro:  Strength and sensation are intact Psych: euthymic mood, full affect   EKG: Normal sinus rhythm with LVH, evidence of anterior lateral infarct.  Heart rate of 75 bpm.  (Personally reviewed)  Recent Labs: 01/05/2019: Hemoglobin 12.4; Platelets 116 10/28/2019: BUN 20; Creatinine, Ser 1.34; Potassium 4.6; Sodium 142    Lipid Panel    Component Value Date/Time   CHOL 144 10/28/2019 1549   TRIG 231 (H) 10/28/2019 1549   HDL 45 10/28/2019 1549   CHOLHDL 3.2 10/28/2019 1549   CHOLHDL 2.4 10/18/2018 1612   VLDL 44 (H) 10/18/2018 1612   LDLCALC 62 10/28/2019 1549   LDLDIRECT 78 09/11/2011 1503      Wt Readings from Last 3 Encounters:  11/03/19 189 lb (85.7 kg)  10/28/19 194 lb (88 kg)  07/20/19 201 lb 3.2 oz (91.3 kg)      Other studies Reviewed: Vascular Ultrasound  12/30/2018 Right: No evidence of deep vein thrombosis in the lower extremity. No  indirect evidence of obstruction proximal to the inguinal ligament. No  cystic structure found in the popliteal fossa. Ultrasound characteristics  of enlarged lymph nodes are noted in  the groin. Two large lymph nodes seen in groin. Largest measuring 3.5 x  1.6 x 1.3 cm.  Left: No evidence of common femoral vein obstruction.   Echocardiogram 07/13/2018  Left ventricle: The cavity size was normal. Wall thickness was  normal. Systolic function was vigorous. The estimated ejection  fraction was in the range of 65% to 70%. Wall motion was normal;  there were no regional wall motion abnormalities. Doppler  parameters are consistent with abnormal left ventricular  relaxation (grade 1 diastolic dysfunction). The E&'e&' ratio is  >15, suggesting elevated LV filling pressure.  - Aortic valve: Bicuspid aortic valve with thickened leaflets. No  significant stenosis. There was no regurgitation.  - Mitral valve: Mildly thickened leaflets . There was mild  regurgitation.  - Left atrium: The atrium was normal in size.  - Inferior vena cava: The vessel was dilated. The respirophasic  diameter changes were blunted (< 50%), consistent with elevated  central venous pressure.   ASSESSMENT AND PLAN:  1.  Chronic diastolic CHF: Currently on Lasix 60 mg twice daily.  He also is on potassium supplement 20 mEq daily.  He still has some lower extremity edema especially around the ankles in the dependent position.  I am going to recheck labs with higher dose of Lasix prescribed recently.  BMP T and a BNP.  He is given a copy of the "salty 6" to take home with him to evaluate what he is eating.  He does not appear to want to change his habits at this time.  2.  Hypertension: Currently well controlled on medication regimen.  We will continue lisinopril 5 mg  daily.  Checking labs  3.  CAD: Drug-eluting stent to the  circumflex on 07/12/2018.  He continues on dual antiplatelet therapy.  Significant risk factors in the setting of diabetes, hypertension, history of smoking, age.    4.  Hyperlipidemia: Atorvastatin 80 mg daily will continue.  Will need to have fasting lipids LFTs on follow-up visit with goal of LDL less than 70.  5.  EtOH abuse: Mixes Kool-Aid and vodka and drinks this throughout the day.  He also states he has some at night before he goes to bed to help him sleep.  I have advised him on cutting down on his alcohol.  He he does not feel he drinks that much.  His wife who is with him disagrees.  He is aware of the dangers of ongoing alcohol abuse.  6.  History of COPD: He continues to have coughing with some phlegm production.  He did stop smoking about a year ago.  He used to have an inhaler.  May need to have this resumed but will defer to family practice or they may recommend pulmonary consultation.   Current medicines are reviewed at length with the patient today.  I have spent 45 minutes  dedicated to the care of this patient on the date of this encounter to include pre-visit review of records, assessment, management and diagnostic testing,with shared decision making.  Labs/ tests ordered today include: BMET BNP Adam Ray, ANP, AACC   11/03/2019 2:52 PM    [facility] [address] Office [phone] Fax [phone]  Notice: This dictation was prepared with Dragon dictation along with smaller phrase technology. Any transcriptional errors that result from this process are unintentional and may not be corrected upon review.

## 2019-11-02 ENCOUNTER — Telehealth: Payer: Self-pay | Admitting: Adult Health

## 2019-11-02 NOTE — Telephone Encounter (Signed)
New message   Patient states that his wife will be coming with him to office visit on tomorrow because he has issues with his sight and can't read.

## 2019-11-02 NOTE — Telephone Encounter (Signed)
Nurse informed pt that wife ok to attend appointment.

## 2019-11-03 ENCOUNTER — Encounter: Payer: Self-pay | Admitting: Adult Health

## 2019-11-03 ENCOUNTER — Ambulatory Visit: Payer: No Typology Code available for payment source | Admitting: Adult Health

## 2019-11-03 ENCOUNTER — Ambulatory Visit (INDEPENDENT_AMBULATORY_CARE_PROVIDER_SITE_OTHER): Payer: No Typology Code available for payment source | Admitting: Adult Health

## 2019-11-03 ENCOUNTER — Other Ambulatory Visit: Payer: Self-pay

## 2019-11-03 VITALS — BP 126/79 | HR 75 | Temp 98.4°F | Ht 70.0 in | Wt 189.0 lb

## 2019-11-03 DIAGNOSIS — I10 Essential (primary) hypertension: Secondary | ICD-10-CM | POA: Diagnosis not present

## 2019-11-03 DIAGNOSIS — I5032 Chronic diastolic (congestive) heart failure: Secondary | ICD-10-CM

## 2019-11-03 DIAGNOSIS — I2121 ST elevation (STEMI) myocardial infarction involving left circumflex coronary artery: Secondary | ICD-10-CM | POA: Diagnosis not present

## 2019-11-03 DIAGNOSIS — F1020 Alcohol dependence, uncomplicated: Secondary | ICD-10-CM

## 2019-11-03 DIAGNOSIS — I2510 Atherosclerotic heart disease of native coronary artery without angina pectoris: Secondary | ICD-10-CM

## 2019-11-03 DIAGNOSIS — E7800 Pure hypercholesterolemia, unspecified: Secondary | ICD-10-CM | POA: Diagnosis not present

## 2019-11-03 DIAGNOSIS — J449 Chronic obstructive pulmonary disease, unspecified: Secondary | ICD-10-CM

## 2019-11-03 DIAGNOSIS — Z79899 Other long term (current) drug therapy: Secondary | ICD-10-CM | POA: Diagnosis not present

## 2019-11-03 DIAGNOSIS — Q231 Congenital insufficiency of aortic valve: Secondary | ICD-10-CM

## 2019-11-03 NOTE — Patient Instructions (Signed)
Medication Instructions:  Continue current medications    *If you need a refill on your cardiac medications before your next appointment, please call your pharmacy*   Lab Work: BMP and BNP  If you have labs (blood work) drawn today and your tests are completely normal, you will receive your results only by: [name] MyChart Message (if you have MyChart) OR . A paper copy in the mail If you have any lab test that is abnormal or we need to change your treatment, we will call you to review the results.   Testing/Procedures: None Ordered   Follow-Up: At [facility], you and your health needs are our priority.  As part of our continuing mission to provide you with exceptional heart care, we have created designated Provider Care Teams.  These Care Teams include your primary Cardiologist (physician) and Advanced Practice Providers (APPs -  Physician Assistants and Nurse Practitioners) who all work together to provide you with the care you need, when you need it.  We recommend signing up for the patient portal called "MyChart".  Sign up information is provided on this After Visit Summary.  MyChart is used to connect with patients for Virtual Visits (Telemedicine).  Patients are able to view lab/test results, encounter notes, upcoming appointments, etc.  Non-urgent messages can be sent to your provider as well.   To learn more about what you can do with MyChart, go to [email or web address].    Your next appointment:   3 month(s)  The format for your next appointment:   In Person  Provider:   You may see Quay Burow, MD or one of the following Advanced Practice Providers on your designated Care Team:    Kerin Ransom, PA-C  [place], [place]  Coletta Memos, [place]

## 2019-11-04 ENCOUNTER — Telehealth: Payer: Self-pay

## 2019-11-04 LAB — BASIC METABOLIC PANEL
BUN/Creatinine Ratio: 19 (ref 10–24)
BUN: 26 mg/dL (ref 8–27)
CO2: 25 mmol/L (ref 20–29)
Calcium: 9.1 mg/dL (ref 8.6–10.2)
Chloride: 97 mmol/L (ref 96–106)
Creatinine, Ser: 1.37 mg/dL — ABNORMAL HIGH (ref 0.76–1.27)
GFR calc Af Amer: 63 mL/min/{1.73_m2} (ref >59)
GFR calc non Af Amer: 54 mL/min/{1.73_m2} — ABNORMAL LOW (ref >59)
Glucose: 237 mg/dL — ABNORMAL HIGH (ref 65–99)
Potassium: 4.5 mmol/L (ref 3.5–5.2)
Sodium: 137 mmol/L (ref 134–144)

## 2019-11-04 LAB — BRAIN NATRIURETIC PEPTIDE: BNP: 4.5 pg/mL (ref 0.0–100.0)

## 2019-11-04 NOTE — Telephone Encounter (Addendum)
Left a voice message for the patient to call the office a call to discuss his recent lab results.   ----- Message from Lendon Colonel, NP sent at 11/04/2019  7:29 AM EDT ----- Blood glucose is elevated. Kidney function is a little strained.  Please follow up with your PCP for recommendations for better diabetic control.

## 2019-11-09 ENCOUNTER — Ambulatory Visit: Payer: No Typology Code available for payment source | Admitting: Family Medicine

## 2019-11-11 ENCOUNTER — Telehealth: Payer: Self-pay | Admitting: Family Medicine

## 2019-11-11 NOTE — Telephone Encounter (Signed)
Called patient with no answer and left voicemail encouraging him to follow up and determine why he no-showed for appointment.

## 2019-11-16 ENCOUNTER — Telehealth: Payer: Self-pay | Admitting: Family Medicine

## 2019-11-16 NOTE — Telephone Encounter (Signed)
Pt is requesting the doctor call him at [phone] about ordering an inhaler through from the [place].

## 2019-11-18 NOTE — Progress Notes (Signed)
This encounter was created in error - please disregard.

## 2019-11-22 NOTE — Telephone Encounter (Signed)
Attempted to reach patient, no answer x2. I think I reached a voicemail that says "Good morning, how are you doing?" and it is the only response on the VM. I therefore did not leave a VM.   Will wait to hear back from patient if this is still a need he has.  [name], MD

## 2019-11-30 ENCOUNTER — Ambulatory Visit: Payer: No Typology Code available for payment source | Admitting: Family Medicine

## 2019-12-07 ENCOUNTER — Telehealth: Payer: Self-pay

## 2019-12-07 NOTE — Telephone Encounter (Signed)
Patient's wife calls nurse line requesting albuterol inhaler be sent into the [place] pharmacy. Not on current medication list. Patient states that they discussed patient receiving albuterol rx from [place] at last office visit on 10/28/19. Patient wants to receive medication from the [place], however, states that they need [place] to place the order.   Forwarding to PCP and Dr. Junius Finner, RN

## 2019-12-08 ENCOUNTER — Ambulatory Visit (INDEPENDENT_AMBULATORY_CARE_PROVIDER_SITE_OTHER): Payer: Medicare HMO | Admitting: Family Medicine

## 2019-12-08 ENCOUNTER — Other Ambulatory Visit: Payer: Self-pay

## 2019-12-08 ENCOUNTER — Encounter: Payer: Self-pay | Admitting: Family Medicine

## 2019-12-08 VITALS — BP 114/64 | HR 61 | Ht 70.0 in | Wt 195.0 lb

## 2019-12-08 DIAGNOSIS — M7989 Other specified soft tissue disorders: Secondary | ICD-10-CM | POA: Diagnosis not present

## 2019-12-08 MED ORDER — ALBUTEROL SULFATE HFA 108 (90 BASE) MCG/ACT IN AERS: 2.0000 | INHALATION_SPRAY | RESPIRATORY_TRACT | 2 refills | Status: DC | PRN

## 2019-12-08 NOTE — Progress Notes (Addendum)
   SUBJECTIVE:   CHIEF COMPLAINT / HPI:   Patient is here for follow-up of leg swelling.  Missed last appointment.  States that he has been taking his furosemide once per day and his weight has remained about the same.  He states he cannot take it more than once a day as it would cause him to urinate too much.  Patient reports that he follows at the [facility] clinic for a lot of his health concerns and because of this we are limited in what we are able to see in our system.  Discussed extensively with patient today given that with fragmented care due to him receiving care at the [facility] as well as here it is difficult for [place] to know how to manage his medications appropriately.  He states he would much rather be seen here in our clinic and he will try to send over his records to [place].    He is currently denying any chest pain, shortness of breath.  He reports that he was about 190 pounds at home but he does not weigh himself daily.  He reports compliance with his other medications.  He denies any recent illness, fever, chills.  He reports that he is still having occasionally leg cramping but it is not as bad as it was before.  He notes that his activity level has not changed or been wrapped up with shortness of breath on exertion.  Patient also here today with a new meter that he needs help setting up.  Did help patient set up his new blood glucose monitor in clinic in order for him to be able to use this daily.   Patient sees Dr. Cherlynn Kaiser in [place], [place] at [name] clinic, fax:[phone]  PERTINENT  PMH / [place]: HTN, COPD, PAD, CAD, venous insufficiency, OSA, T2DM  OBJECTIVE:  BP 114/64   Pulse 61   Ht 5\' 10"  (1.778 m)   Wt 195 lb (88.5 kg)   SpO2 97%   BMI 27.98 kg/m   General: NAD, pleasant Neck: Supple Cardiovascular: RRR, no m/r/g, 3+ pitting edema bilaterally up to knees Respiratory: CTA BL, no crackles noted at bases, normal work of breathing Psych: AOx3, appropriate  affect  ASSESSMENT/PLAN:   Leg swelling BMP with improved creatinine after initiating diuresis.  Creatinine stable.  We will continue to attempt diuresis.  Patient to start taking furosemide twice daily in order to help more with his swelling as he reports that the 60 mg works but he does not like taking it.  Encourage patient to also use compression stockings and elevate legs while he is sitting at home.  We will attempt to obtain records from [place] system.  VSS today.  Encouraged close follow-up with PCP, Dr. [name] on 6/22 at 10:30 AM in order to decrease fragmented care.  Patient reported that he would prefer to have care from our clinic.  Patient voiced understanding of plan and will return if he develops any worsening leg swelling, shortness of breath or chest pain.    [place] Karaline Buresh, DO PGY-3, [facility]

## 2019-12-08 NOTE — Progress Notes (Deleted)
° °  SUBJECTIVE:   CHIEF COMPLAINT / HPI:   ***: ***  PERTINENT  PMH / PSH: ***  OBJECTIVE:  BP 114/64    Pulse 61    Ht 5\' 10"  (1.778 m)    Wt 195 lb (88.5 kg)    SpO2 97%    BMI 27.98 kg/m   General: NAD, pleasant Neck: Supple, no LAD Cardiovascular: RRR, no m/r/g, no LE edema*** Respiratory: CTA BL***, normal work of breathing Gastrointestinal: soft, nontender, nondistended, normoactive BS*** Neuro: CN II-XII grossly intact Psych: AOx3, appropriate affect ***  ASSESSMENT/PLAN:   No problem-specific Assessment & Plan notes found for this encounter.    [place] Kaiden Pech, DO PGY-3, [facility]

## 2019-12-08 NOTE — Telephone Encounter (Signed)
Looks like patient has an appointment this AM with Dr. Enid Derry. [place], let me know if I can help - I'll be in my office this AM Thanks [name], MD

## 2019-12-08 NOTE — Patient Instructions (Signed)
Thank you for coming to see me today. It was a pleasure! Today we talked about:   Please start taking the furosemide 60mg  twice daily. If you develop shortness of breath, chest pain or your leg swelling gets much worse then please come in sooner.   Please follow-up with Dr. [name] on 6/22 at 10:30am or sooner as needed.  If you have any questions or concerns, please do not hesitate to call the office at [phone]. Our fax number is [phone] for the [place] to send [place] records.   Take Care,   Adam Aino Heckert, DO

## 2019-12-09 LAB — BASIC METABOLIC PANEL
BUN/Creatinine Ratio: 13 (ref 10–24)
BUN: 15 mg/dL (ref 8–27)
CO2: 25 mmol/L (ref 20–29)
Calcium: 9 mg/dL (ref 8.6–10.2)
Chloride: 101 mmol/L (ref 96–106)
Creatinine, Ser: 1.18 mg/dL (ref 0.76–1.27)
GFR calc Af Amer: 75 mL/min/{1.73_m2} (ref >59)
GFR calc non Af Amer: 65 mL/min/{1.73_m2} (ref >59)
Glucose: 246 mg/dL — ABNORMAL HIGH (ref 65–99)
Potassium: 4.4 mmol/L (ref 3.5–5.2)
Sodium: 139 mmol/L (ref 134–144)

## 2019-12-14 NOTE — Assessment & Plan Note (Addendum)
BMP with improved creatinine after initiating diuresis.  Creatinine stable.  We will continue to attempt diuresis.  Patient to start taking furosemide twice daily in order to help more with his swelling as he reports that the 60 mg works but he does not like taking it.  Encourage patient to also use compression stockings and elevate legs while he is sitting at home.  We will attempt to obtain records from [place] system.  VSS today.  Encouraged close follow-up with PCP, Dr. [name] on 6/22 at 10:30 AM in order to decrease fragmented care.  Patient reported that he would prefer to have care from our clinic.  Patient voiced understanding of plan and will return if he develops any worsening leg swelling, shortness of breath or chest pain.

## 2019-12-20 NOTE — Progress Notes (Deleted)
°  Date of Visit: 12/21/2019   SUBJECTIVE:   HPI:  Adam Ray presents today for ***  OBJECTIVE:   There were no vitals taken for this visit. Gen: *** HEENT: *** Heart: *** Lungs: *** Neuro: *** Ext: ***  ASSESSMENT/PLAN:   Health maintenance:  -***  No problem-specific Assessment & Plan notes found for this encounter.  FOLLOW UP: Follow up in *** for ***  Adam Ray, [place]

## 2019-12-21 ENCOUNTER — Ambulatory Visit: Payer: No Typology Code available for payment source | Admitting: Family Medicine

## 2019-12-31 ENCOUNTER — Encounter (HOSPITAL_COMMUNITY): Payer: Self-pay

## 2019-12-31 ENCOUNTER — Other Ambulatory Visit: Payer: Self-pay

## 2019-12-31 ENCOUNTER — Inpatient Hospital Stay (HOSPITAL_COMMUNITY)
Admission: EM | Admit: 2019-12-31 | Discharge: 2020-01-15 | DRG: 475 | Disposition: A | Discharge status: Home Health | Payer: No Typology Code available for payment source | Attending: Family Medicine | Admitting: Family Medicine

## 2019-12-31 DIAGNOSIS — L03115 Cellulitis of right lower limb: Secondary | ICD-10-CM | POA: Diagnosis present

## 2019-12-31 DIAGNOSIS — I5032 Chronic diastolic (congestive) heart failure: Secondary | ICD-10-CM | POA: Diagnosis present

## 2019-12-31 DIAGNOSIS — L02611 Cutaneous abscess of right foot: Secondary | ICD-10-CM

## 2019-12-31 DIAGNOSIS — Z833 Family history of diabetes mellitus: Secondary | ICD-10-CM

## 2019-12-31 DIAGNOSIS — Z823 Family history of stroke: Secondary | ICD-10-CM

## 2019-12-31 DIAGNOSIS — M25569 Pain in unspecified knee: Secondary | ICD-10-CM

## 2019-12-31 DIAGNOSIS — Z955 Presence of coronary angioplasty implant and graft: Secondary | ICD-10-CM

## 2019-12-31 DIAGNOSIS — N182 Chronic kidney disease, stage 2 (mild): Secondary | ICD-10-CM | POA: Diagnosis present

## 2019-12-31 DIAGNOSIS — M109 Gout, unspecified: Secondary | ICD-10-CM | POA: Diagnosis present

## 2019-12-31 DIAGNOSIS — Z9841 Cataract extraction status, right eye: Secondary | ICD-10-CM

## 2019-12-31 DIAGNOSIS — I878 Other specified disorders of veins: Secondary | ICD-10-CM | POA: Diagnosis present

## 2019-12-31 DIAGNOSIS — I130 Hypertensive heart and chronic kidney disease with heart failure and stage 1 through stage 4 chronic kidney disease, or unspecified chronic kidney disease: Secondary | ICD-10-CM | POA: Diagnosis present

## 2019-12-31 DIAGNOSIS — I252 Old myocardial infarction: Secondary | ICD-10-CM

## 2019-12-31 DIAGNOSIS — I2510 Atherosclerotic heart disease of native coronary artery without angina pectoris: Secondary | ICD-10-CM | POA: Diagnosis present

## 2019-12-31 DIAGNOSIS — M868X6 Other osteomyelitis, lower leg: Secondary | ICD-10-CM | POA: Diagnosis not present

## 2019-12-31 DIAGNOSIS — Z79899 Other long term (current) drug therapy: Secondary | ICD-10-CM

## 2019-12-31 DIAGNOSIS — M86271 Subacute osteomyelitis, right ankle and foot: Secondary | ICD-10-CM

## 2019-12-31 DIAGNOSIS — F1010 Alcohol abuse, uncomplicated: Secondary | ICD-10-CM | POA: Diagnosis present

## 2019-12-31 DIAGNOSIS — E1151 Type 2 diabetes mellitus with diabetic peripheral angiopathy without gangrene: Secondary | ICD-10-CM | POA: Diagnosis present

## 2019-12-31 DIAGNOSIS — Z794 Long term (current) use of insulin: Secondary | ICD-10-CM

## 2019-12-31 DIAGNOSIS — Z7982 Long term (current) use of aspirin: Secondary | ICD-10-CM

## 2019-12-31 DIAGNOSIS — Z8249 Family history of ischemic heart disease and other diseases of the circulatory system: Secondary | ICD-10-CM

## 2019-12-31 DIAGNOSIS — E1122 Type 2 diabetes mellitus with diabetic chronic kidney disease: Secondary | ICD-10-CM | POA: Diagnosis present

## 2019-12-31 DIAGNOSIS — L0390 Cellulitis, unspecified: Secondary | ICD-10-CM | POA: Diagnosis present

## 2019-12-31 DIAGNOSIS — J449 Chronic obstructive pulmonary disease, unspecified: Secondary | ICD-10-CM | POA: Diagnosis present

## 2019-12-31 DIAGNOSIS — E1149 Type 2 diabetes mellitus with other diabetic neurological complication: Secondary | ICD-10-CM

## 2019-12-31 DIAGNOSIS — E11649 Type 2 diabetes mellitus with hypoglycemia without coma: Secondary | ICD-10-CM | POA: Diagnosis not present

## 2019-12-31 DIAGNOSIS — Z885 Allergy status to narcotic agent status: Secondary | ICD-10-CM

## 2019-12-31 DIAGNOSIS — Z8616 Personal history of COVID-19: Secondary | ICD-10-CM

## 2019-12-31 DIAGNOSIS — I152 Hypertension secondary to endocrine disorders: Secondary | ICD-10-CM | POA: Diagnosis present

## 2019-12-31 DIAGNOSIS — E785 Hyperlipidemia, unspecified: Secondary | ICD-10-CM | POA: Diagnosis present

## 2019-12-31 DIAGNOSIS — I739 Peripheral vascular disease, unspecified: Secondary | ICD-10-CM | POA: Diagnosis present

## 2019-12-31 DIAGNOSIS — M7989 Other specified soft tissue disorders: Secondary | ICD-10-CM | POA: Diagnosis not present

## 2019-12-31 DIAGNOSIS — I10 Essential (primary) hypertension: Secondary | ICD-10-CM | POA: Diagnosis present

## 2019-12-31 LAB — URINALYSIS, ROUTINE W REFLEX MICROSCOPIC
Bacteria, UA: NONE SEEN
Bilirubin Urine: NEGATIVE
Glucose, UA: 50 mg/dL — AB
Ketones, ur: NEGATIVE mg/dL
Leukocytes,Ua: NEGATIVE
Nitrite: NEGATIVE
Protein, ur: 30 mg/dL — AB
Specific Gravity, Urine: 1.026 (ref 1.005–1.030)
pH: 5 (ref 5.0–8.0)

## 2019-12-31 LAB — CBC WITH DIFFERENTIAL/PLATELET
Abs Immature Granulocytes: 0.05 10*3/uL (ref 0.00–0.07)
Basophils Absolute: 0 10*3/uL (ref 0.0–0.1)
Basophils Relative: 0 %
Eosinophils Absolute: 0 10*3/uL (ref 0.0–0.5)
Eosinophils Relative: 1 %
HCT: 40 % (ref 39.0–52.0)
Hemoglobin: 12.8 g/dL — ABNORMAL LOW (ref 13.0–17.0)
Immature Granulocytes: 1 %
Lymphocytes Relative: 7 %
Lymphs Abs: 0.5 10*3/uL — ABNORMAL LOW (ref 0.7–4.0)
MCH: 29.2 pg (ref 26.0–34.0)
MCHC: 32 g/dL (ref 30.0–36.0)
MCV: 91.1 fL (ref 80.0–100.0)
Monocytes Absolute: 0.6 10*3/uL (ref 0.1–1.0)
Monocytes Relative: 9 %
Neutro Abs: 5.4 10*3/uL (ref 1.7–7.7)
Neutrophils Relative %: 82 %
Platelets: 143 10*3/uL — ABNORMAL LOW (ref 150–400)
RBC: 4.39 MIL/uL (ref 4.22–5.81)
RDW: 13 % (ref 11.5–15.5)
WBC: 6.5 10*3/uL (ref 4.0–10.5)
nRBC: 0 % (ref 0.0–0.2)

## 2019-12-31 LAB — COMPREHENSIVE METABOLIC PANEL
ALT: 31 U/L (ref 0–44)
AST: 30 U/L (ref 15–41)
Albumin: 3.1 g/dL — ABNORMAL LOW (ref 3.5–5.0)
Alkaline Phosphatase: 106 U/L (ref 38–126)
Anion gap: 11 (ref 5–15)
BUN: 12 mg/dL (ref 8–23)
CO2: 26 mmol/L (ref 22–32)
Calcium: 8.8 mg/dL — ABNORMAL LOW (ref 8.9–10.3)
Chloride: 97 mmol/L — ABNORMAL LOW (ref 98–111)
Creatinine, Ser: 1.38 mg/dL — ABNORMAL HIGH (ref 0.61–1.24)
GFR calc Af Amer: >60 mL/min (ref >60)
GFR calc non Af Amer: 54 mL/min — ABNORMAL LOW (ref >60)
Glucose, Bld: 223 mg/dL — ABNORMAL HIGH (ref 70–99)
Potassium: 4 mmol/L (ref 3.5–5.1)
Sodium: 134 mmol/L — ABNORMAL LOW (ref 135–145)
Total Bilirubin: 0.9 mg/dL (ref 0.3–1.2)
Total Protein: 6.5 g/dL (ref 6.5–8.1)

## 2019-12-31 LAB — PROTIME-INR
INR: 1.2 (ref 0.8–1.2)
Prothrombin Time: 14.5 seconds (ref 11.4–15.2)

## 2019-12-31 LAB — LACTIC ACID, PLASMA: Lactic Acid, Venous: 2 mmol/L (ref 0.5–1.9)

## 2019-12-31 MED ORDER — ACETAMINOPHEN 325 MG PO TABS
650.0000 mg | ORAL_TABLET | Freq: Once | ORAL | Status: AC
  Administered 2019-12-31: 650 mg via ORAL
  Filled 2019-12-31: qty 2

## 2019-12-31 MED ORDER — SODIUM CHLORIDE 0.9% FLUSH: 3.0000 mL | Freq: Once | INTRAVENOUS | Status: DC

## 2019-12-31 NOTE — ED Triage Notes (Signed)
Pt arrives to ED w/ c/o weeping BLE edema R>L x 1 week. Pt endorses CHF hx. Pt denies pain. Pt has fever in triage 101.3 F.

## 2020-01-01 ENCOUNTER — Encounter (HOSPITAL_COMMUNITY): Payer: Self-pay | Admitting: Internal Medicine

## 2020-01-01 ENCOUNTER — Inpatient Hospital Stay (HOSPITAL_COMMUNITY): Payer: No Typology Code available for payment source

## 2020-01-01 ENCOUNTER — Emergency Department (HOSPITAL_COMMUNITY): Payer: No Typology Code available for payment source

## 2020-01-01 ENCOUNTER — Other Ambulatory Visit: Payer: Self-pay

## 2020-01-01 DIAGNOSIS — M109 Gout, unspecified: Secondary | ICD-10-CM | POA: Diagnosis present

## 2020-01-01 DIAGNOSIS — E1151 Type 2 diabetes mellitus with diabetic peripheral angiopathy without gangrene: Secondary | ICD-10-CM | POA: Diagnosis present

## 2020-01-01 DIAGNOSIS — E785 Hyperlipidemia, unspecified: Secondary | ICD-10-CM | POA: Diagnosis present

## 2020-01-01 DIAGNOSIS — E1140 Type 2 diabetes mellitus with diabetic neuropathy, unspecified: Secondary | ICD-10-CM

## 2020-01-01 DIAGNOSIS — I878 Other specified disorders of veins: Secondary | ICD-10-CM | POA: Diagnosis present

## 2020-01-01 DIAGNOSIS — N182 Chronic kidney disease, stage 2 (mild): Secondary | ICD-10-CM | POA: Diagnosis present

## 2020-01-01 DIAGNOSIS — F1010 Alcohol abuse, uncomplicated: Secondary | ICD-10-CM | POA: Diagnosis present

## 2020-01-01 DIAGNOSIS — E11649 Type 2 diabetes mellitus with hypoglycemia without coma: Secondary | ICD-10-CM | POA: Diagnosis not present

## 2020-01-01 DIAGNOSIS — L03115 Cellulitis of right lower limb: Secondary | ICD-10-CM | POA: Diagnosis present

## 2020-01-01 DIAGNOSIS — M868X6 Other osteomyelitis, lower leg: Secondary | ICD-10-CM | POA: Diagnosis present

## 2020-01-01 DIAGNOSIS — L0390 Cellulitis, unspecified: Secondary | ICD-10-CM | POA: Diagnosis present

## 2020-01-01 DIAGNOSIS — I5032 Chronic diastolic (congestive) heart failure: Secondary | ICD-10-CM | POA: Diagnosis present

## 2020-01-01 DIAGNOSIS — E1142 Type 2 diabetes mellitus with diabetic polyneuropathy: Secondary | ICD-10-CM | POA: Diagnosis not present

## 2020-01-01 DIAGNOSIS — Z885 Allergy status to narcotic agent status: Secondary | ICD-10-CM | POA: Diagnosis not present

## 2020-01-01 DIAGNOSIS — I130 Hypertensive heart and chronic kidney disease with heart failure and stage 1 through stage 4 chronic kidney disease, or unspecified chronic kidney disease: Secondary | ICD-10-CM | POA: Diagnosis present

## 2020-01-01 DIAGNOSIS — E1122 Type 2 diabetes mellitus with diabetic chronic kidney disease: Secondary | ICD-10-CM | POA: Diagnosis present

## 2020-01-01 DIAGNOSIS — Z9841 Cataract extraction status, right eye: Secondary | ICD-10-CM | POA: Diagnosis not present

## 2020-01-01 DIAGNOSIS — I252 Old myocardial infarction: Secondary | ICD-10-CM | POA: Diagnosis not present

## 2020-01-01 DIAGNOSIS — I10 Essential (primary) hypertension: Secondary | ICD-10-CM | POA: Diagnosis not present

## 2020-01-01 DIAGNOSIS — I2510 Atherosclerotic heart disease of native coronary artery without angina pectoris: Secondary | ICD-10-CM | POA: Diagnosis present

## 2020-01-01 DIAGNOSIS — L02611 Cutaneous abscess of right foot: Secondary | ICD-10-CM | POA: Diagnosis not present

## 2020-01-01 DIAGNOSIS — J449 Chronic obstructive pulmonary disease, unspecified: Secondary | ICD-10-CM | POA: Diagnosis not present

## 2020-01-01 DIAGNOSIS — Z7982 Long term (current) use of aspirin: Secondary | ICD-10-CM | POA: Diagnosis not present

## 2020-01-01 DIAGNOSIS — Z833 Family history of diabetes mellitus: Secondary | ICD-10-CM | POA: Diagnosis not present

## 2020-01-01 DIAGNOSIS — E1149 Type 2 diabetes mellitus with other diabetic neurological complication: Secondary | ICD-10-CM | POA: Diagnosis present

## 2020-01-01 DIAGNOSIS — Z89511 Acquired absence of right leg below knee: Secondary | ICD-10-CM | POA: Diagnosis not present

## 2020-01-01 DIAGNOSIS — M86271 Subacute osteomyelitis, right ankle and foot: Secondary | ICD-10-CM | POA: Diagnosis not present

## 2020-01-01 DIAGNOSIS — M7989 Other specified soft tissue disorders: Secondary | ICD-10-CM | POA: Diagnosis present

## 2020-01-01 DIAGNOSIS — Z79899 Other long term (current) drug therapy: Secondary | ICD-10-CM | POA: Diagnosis not present

## 2020-01-01 DIAGNOSIS — E1141 Type 2 diabetes mellitus with diabetic mononeuropathy: Secondary | ICD-10-CM | POA: Diagnosis not present

## 2020-01-01 DIAGNOSIS — Z89512 Acquired absence of left leg below knee: Secondary | ICD-10-CM | POA: Diagnosis not present

## 2020-01-01 DIAGNOSIS — Z8616 Personal history of COVID-19: Secondary | ICD-10-CM | POA: Diagnosis not present

## 2020-01-01 DIAGNOSIS — I739 Peripheral vascular disease, unspecified: Secondary | ICD-10-CM | POA: Diagnosis not present

## 2020-01-01 DIAGNOSIS — I70261 Atherosclerosis of native arteries of extremities with gangrene, right leg: Secondary | ICD-10-CM | POA: Diagnosis not present

## 2020-01-01 DIAGNOSIS — Z955 Presence of coronary angioplasty implant and graft: Secondary | ICD-10-CM | POA: Diagnosis not present

## 2020-01-01 DIAGNOSIS — Z794 Long term (current) use of insulin: Secondary | ICD-10-CM | POA: Diagnosis not present

## 2020-01-01 LAB — CBC
HCT: 36.2 % — ABNORMAL LOW (ref 39.0–52.0)
Hemoglobin: 11.7 g/dL — ABNORMAL LOW (ref 13.0–17.0)
MCH: 29.8 pg (ref 26.0–34.0)
MCHC: 32.3 g/dL (ref 30.0–36.0)
MCV: 92.3 fL (ref 80.0–100.0)
Platelets: 126 10*3/uL — ABNORMAL LOW (ref 150–400)
RBC: 3.92 MIL/uL — ABNORMAL LOW (ref 4.22–5.81)
RDW: 13.1 % (ref 11.5–15.5)
WBC: 7 10*3/uL (ref 4.0–10.5)
nRBC: 0 % (ref 0.0–0.2)

## 2020-01-01 LAB — HIV ANTIBODY (ROUTINE TESTING W REFLEX): HIV Screen 4th Generation wRfx: NONREACTIVE

## 2020-01-01 LAB — SARS CORONAVIRUS 2 BY RT PCR (HOSPITAL ORDER, PERFORMED IN ~~LOC~~ HOSPITAL LAB): SARS Coronavirus 2: NEGATIVE

## 2020-01-01 LAB — GLUCOSE, CAPILLARY
Glucose-Capillary: 225 mg/dL — ABNORMAL HIGH (ref 70–99)
Glucose-Capillary: 186 mg/dL — ABNORMAL HIGH (ref 70–99)
Glucose-Capillary: 190 mg/dL — ABNORMAL HIGH (ref 70–99)
Glucose-Capillary: 190 mg/dL — ABNORMAL HIGH (ref 70–99)
Glucose-Capillary: 173 mg/dL — ABNORMAL HIGH (ref 70–99)

## 2020-01-01 LAB — BASIC METABOLIC PANEL
Anion gap: 11 (ref 5–15)
BUN: 11 mg/dL (ref 8–23)
CO2: 23 mmol/L (ref 22–32)
Calcium: 8.3 mg/dL — ABNORMAL LOW (ref 8.9–10.3)
Chloride: 101 mmol/L (ref 98–111)
Creatinine, Ser: 1.25 mg/dL — ABNORMAL HIGH (ref 0.61–1.24)
GFR calc Af Amer: >60 mL/min (ref >60)
GFR calc non Af Amer: >60 mL/min (ref >60)
Glucose, Bld: 206 mg/dL — ABNORMAL HIGH (ref 70–99)
Potassium: 3.8 mmol/L (ref 3.5–5.1)
Sodium: 135 mmol/L (ref 135–145)

## 2020-01-01 LAB — SEDIMENTATION RATE: Sed Rate: 54 mm/hr — ABNORMAL HIGH (ref 0–16)

## 2020-01-01 LAB — CBG MONITORING, ED: Glucose-Capillary: 229 mg/dL — ABNORMAL HIGH (ref 70–99)

## 2020-01-01 LAB — LACTIC ACID, PLASMA: Lactic Acid, Venous: 1.1 mmol/L (ref 0.5–1.9)

## 2020-01-01 LAB — APTT: aPTT: 45 seconds — ABNORMAL HIGH (ref 24–36)

## 2020-01-01 MED ORDER — ROSUVASTATIN CALCIUM 20 MG PO TABS
20.0000 mg | ORAL_TABLET | Freq: Every day | ORAL | Status: DC
  Administered 2020-01-01 – 2020-01-15 (×15): 20 mg via ORAL
  Filled 2020-01-01 (×15): qty 1

## 2020-01-01 MED ORDER — METOPROLOL TARTRATE 12.5 MG HALF TABLET
12.5000 mg | ORAL_TABLET | Freq: Two times a day (BID) | ORAL | Status: DC
  Administered 2020-01-01 – 2020-01-15 (×29): 12.5 mg via ORAL
  Filled 2020-01-01 (×30): qty 1

## 2020-01-01 MED ORDER — INSULIN ASPART 100 UNIT/ML ~~LOC~~ SOLN
40.0000 [IU] | Freq: Two times a day (BID) | SUBCUTANEOUS | Status: DC
  Administered 2020-01-01 – 2020-01-02 (×4): 40 [IU] via SUBCUTANEOUS

## 2020-01-01 MED ORDER — INSULIN GLARGINE 100 UNIT/ML ~~LOC~~ SOLN
80.0000 [IU] | Freq: Two times a day (BID) | SUBCUTANEOUS | Status: DC
  Administered 2020-01-01 (×2): 80 [IU] via SUBCUTANEOUS
  Filled 2020-01-01 (×4): qty 0.8

## 2020-01-01 MED ORDER — METRONIDAZOLE IN NACL 5-0.79 MG/ML-% IV SOLN
500.0000 mg | Freq: Once | INTRAVENOUS | Status: AC
  Administered 2020-01-01: 500 mg via INTRAVENOUS
  Filled 2020-01-01: qty 100

## 2020-01-01 MED ORDER — VANCOMYCIN HCL 1500 MG/300ML IV SOLN
1500.0000 mg | Freq: Once | INTRAVENOUS | Status: AC
  Administered 2020-01-01: 1500 mg via INTRAVENOUS
  Filled 2020-01-01: qty 300

## 2020-01-01 MED ORDER — VANCOMYCIN HCL IN DEXTROSE 1-5 GM/200ML-% IV SOLN: 1000.0000 mg | Freq: Once | INTRAVENOUS | Status: DC

## 2020-01-01 MED ORDER — NITROGLYCERIN 0.4 MG SL SUBL: 0.4000 mg | SUBLINGUAL_TABLET | SUBLINGUAL | Status: DC | PRN

## 2020-01-01 MED ORDER — BACLOFEN 10 MG PO TABS
10.0000 mg | ORAL_TABLET | Freq: Two times a day (BID) | ORAL | Status: DC | PRN
  Administered 2020-01-06 – 2020-01-15 (×4): 10 mg via ORAL
  Filled 2020-01-01 (×4): qty 1

## 2020-01-01 MED ORDER — SODIUM CHLORIDE 0.9 % IV SOLN
INTRAVENOUS | Status: DC | PRN
  Administered 2020-01-01 (×2): 250 mL via INTRAVENOUS

## 2020-01-01 MED ORDER — SODIUM CHLORIDE 0.9 % IV SOLN
2.0000 g | Freq: Once | INTRAVENOUS | Status: AC
  Administered 2020-01-01: 2 g via INTRAVENOUS
  Filled 2020-01-01: qty 2

## 2020-01-01 MED ORDER — PIPERACILLIN-TAZOBACTAM 3.375 G IVPB 30 MIN: 3.3750 g | Freq: Once | INTRAVENOUS | Status: DC

## 2020-01-01 MED ORDER — ALBUTEROL SULFATE (2.5 MG/3ML) 0.083% IN NEBU: 2.5000 mg | INHALATION_SOLUTION | RESPIRATORY_TRACT | Status: DC | PRN

## 2020-01-01 MED ORDER — ONDANSETRON HCL 4 MG/2ML IJ SOLN: 4.0000 mg | Freq: Four times a day (QID) | INTRAMUSCULAR | Status: DC | PRN

## 2020-01-01 MED ORDER — INSULIN ASPART 100 UNIT/ML ~~LOC~~ SOLN
0.0000 [IU] | Freq: Three times a day (TID) | SUBCUTANEOUS | Status: DC
  Administered 2020-01-01 (×2): 2 [IU] via SUBCUTANEOUS
  Administered 2020-01-01: 3 [IU] via SUBCUTANEOUS
  Administered 2020-01-02: 1 [IU] via SUBCUTANEOUS
  Administered 2020-01-02 – 2020-01-03 (×2): 2 [IU] via SUBCUTANEOUS
  Administered 2020-01-04: 1 [IU] via SUBCUTANEOUS
  Administered 2020-01-05: 5 [IU] via SUBCUTANEOUS
  Administered 2020-01-05: 1 [IU] via SUBCUTANEOUS
  Administered 2020-01-06: 2 [IU] via SUBCUTANEOUS
  Administered 2020-01-07: 1 [IU] via SUBCUTANEOUS
  Administered 2020-01-07 – 2020-01-08 (×2): 5 [IU] via SUBCUTANEOUS
  Administered 2020-01-08: 3 [IU] via SUBCUTANEOUS
  Administered 2020-01-08: 2 [IU] via SUBCUTANEOUS
  Administered 2020-01-09: 5 [IU] via SUBCUTANEOUS
  Administered 2020-01-09: 2 [IU] via SUBCUTANEOUS
  Administered 2020-01-09 – 2020-01-10 (×3): 3 [IU] via SUBCUTANEOUS
  Administered 2020-01-10: 2 [IU] via SUBCUTANEOUS
  Administered 2020-01-11 (×2): 1 [IU] via SUBCUTANEOUS
  Administered 2020-01-11: 2 [IU] via SUBCUTANEOUS
  Administered 2020-01-12: 1 [IU] via SUBCUTANEOUS
  Administered 2020-01-12 – 2020-01-13 (×2): 2 [IU] via SUBCUTANEOUS
  Administered 2020-01-13: 1 [IU] via SUBCUTANEOUS
  Administered 2020-01-13 – 2020-01-14 (×3): 2 [IU] via SUBCUTANEOUS
  Administered 2020-01-15 (×2): 1 [IU] via SUBCUTANEOUS

## 2020-01-01 MED ORDER — FUROSEMIDE 40 MG PO TABS
60.0000 mg | ORAL_TABLET | Freq: Two times a day (BID) | ORAL | Status: DC
  Administered 2020-01-01 – 2020-01-02 (×3): 60 mg via ORAL
  Filled 2020-01-01 (×3): qty 1

## 2020-01-01 MED ORDER — ONDANSETRON HCL 4 MG PO TABS: 4.0000 mg | ORAL_TABLET | Freq: Four times a day (QID) | ORAL | Status: DC | PRN

## 2020-01-01 MED ORDER — POLYETHYLENE GLYCOL 3350 17 G PO PACK: 17.0000 g | PACK | Freq: Every day | ORAL | Status: DC | PRN

## 2020-01-01 MED ORDER — ASPIRIN 81 MG PO CHEW
81.0000 mg | CHEWABLE_TABLET | Freq: Every day | ORAL | Status: DC
  Administered 2020-01-01 – 2020-01-03 (×3): 81 mg via ORAL
  Filled 2020-01-01 (×4): qty 1

## 2020-01-01 MED ORDER — LISINOPRIL 5 MG PO TABS
5.0000 mg | ORAL_TABLET | Freq: Every day | ORAL | Status: DC
  Administered 2020-01-01 – 2020-01-15 (×16): 5 mg via ORAL
  Filled 2020-01-01 (×16): qty 1

## 2020-01-01 MED ORDER — PIPERACILLIN-TAZOBACTAM 3.375 G IVPB
3.3750 g | Freq: Three times a day (TID) | INTRAVENOUS | Status: DC
  Administered 2020-01-01 – 2020-01-06 (×16): 3.375 g via INTRAVENOUS
  Filled 2020-01-01 (×15): qty 50

## 2020-01-01 MED ORDER — VANCOMYCIN HCL IN DEXTROSE 1-5 GM/200ML-% IV SOLN
1000.0000 mg | Freq: Two times a day (BID) | INTRAVENOUS | Status: DC
  Administered 2020-01-01 – 2020-01-02 (×3): 1000 mg via INTRAVENOUS
  Filled 2020-01-01 (×3): qty 200

## 2020-01-01 MED ORDER — TICAGRELOR 90 MG PO TABS
90.0000 mg | ORAL_TABLET | Freq: Two times a day (BID) | ORAL | Status: DC
  Administered 2020-01-01 – 2020-01-03 (×6): 90 mg via ORAL
  Filled 2020-01-01 (×7): qty 1

## 2020-01-01 NOTE — Progress Notes (Signed)
Pharmacy Antibiotic Note  Adam Ray is a 64 y.o. male admitted on 12/31/2019 with RLE infection.  Pharmacy has been consulted for Vancomycin and Zosyn  dosing. Vancomycin 1500 mg IV given in ED at 0200   Plan: Vancomycin 1000 mg IV q12h Zosyn 3.375 g IV q8h   Height: 5\' 10"  (177.8 cm) Weight: 80.7 kg (178 lb) IBW/kg (Calculated) : 73  Temp (24hrs), Avg:100.2 F (37.9 C), Min:99.4 F (37.4 C), Max:101.3 F (38.5 C)  Recent Labs  Lab 12/31/19 2052 01/01/20 0054  WBC 6.5  --   CREATININE 1.38*  --   LATICACIDVEN 2.0* 1.1    Estimated Creatinine Clearance: 55.8 mL/min (A) (by C-G formula based on SCr of 1.38 mg/dL (H)).    Allergies  Allergen Reactions  . Codeine Other (See Comments)    Heart attack.    Caryl Pina 01/01/2020 4:23 AM

## 2020-01-01 NOTE — H&P (Addendum)
History and Physical    Adam Ray GYJ:[id number] DOB: 1956-05-19 DOA: 12/31/2019  PCP: [name], MD  Patient coming from: Home.  Chief Complaint: Right foot discharge.  HPI: Adam Ray is a 64 y.o. male with with history of CAD status post stenting in January 2020 for acute MI with previous history of transmetatarsal amputation of the right foot for osteomyelitis with diabetes mellitus, chronic alcohol abuse hypertension diastolic CHF presents to the ER because of increasing pain and discharge from the right foot stump for the last 2 weeks.  Patient noticed that his right foot stump was having discharge which has been proven.  Denies any fever chills or any trauma.  ED Course: In the ER x-rays of the foot does not show any definite evidence of osteomyelitis.  Patient was febrile with temperature of 101.3 F.  Labs show hemoglobin 12.8 WBC 6.5 lactic acid was 2 improved without any intervention to 1.1 blood glucose 223 sodium 134 albumin 3.1.  Patient was started on empiric antibiotics for cellulitis/abscess possible osteomyelitis of the right foot.  Review of Systems: As per HPI, rest all negative.   Past Medical History:  Diagnosis Date  . Allergy   . Arthritis   . Bronchitis   . Cataract   . Chronic kidney disease   . Claudication [facility])    right foot ray resection  . Colon polyps    hyperplastic  . COPD (chronic obstructive pulmonary disease) ([place])   . Diabetes mellitus   . Genital warts   . Gout   . Hyperlipidemia   . Hypertension   . Osteomyelitis of third toe of right foot ([place])   . Pneumonia   . Status post amputation of toe of right foot ([place]) 09/24/2016    Past Surgical History:  Procedure Laterality Date  . AMPUTATION Right 01/31/2016   Procedure: Right 2nd Toe Amputation;  Surgeon: Newt Minion, MD;  Location: [place];  Service: Orthopedics;  Laterality: Right;  . AMPUTATION Right 07/08/2018   Procedure: RIGHT TRANSMETATARSAL AMPUTATION;  Surgeon: Newt Minion, MD;  Location: [place];  Service: Orthopedics;  Laterality: Right;  . CATARACT EXTRACTION     right eye  . COLONOSCOPY    . CORONARY STENT INTERVENTION N/A 07/12/2018   Procedure: CORONARY STENT INTERVENTION;  Surgeon: Troy Sine, MD;  Location: [place] [facility];  Service: Cardiovascular;  Laterality: N/A;  . CORONARY/GRAFT ACUTE MI REVASCULARIZATION N/A 07/12/2018   Procedure: Coronary/Graft Acute MI Revascularization;  Surgeon: Troy Sine, MD;  Location: [place] [facility];  Service: Cardiovascular;  Laterality: N/A;  . I & D EXTREMITY  04/11/2012   Procedure: IRRIGATION AND DEBRIDEMENT EXTREMITY;  Surgeon: Wylene Simmer, MD;  Location: [place];  Service: Orthopedics;  Laterality: Right;  . LEFT HEART CATH AND CORONARY ANGIOGRAPHY N/A 07/12/2018   Procedure: LEFT HEART CATH AND CORONARY ANGIOGRAPHY;  Surgeon: Troy Sine, MD;  Location: [place] [facility];  Service: Cardiovascular;  Laterality: N/A;  . Surgery left great toe    . Tear ducts bilateral eyes    . TRANSMETATARSAL AMPUTATION Right 07/08/2018     reports that he has been smoking cigars and cigarettes. He started smoking about 56 years ago. He has a 14.40 pack-year smoking history. He has quit using smokeless tobacco. He reports current alcohol use. He reports that he does not use drugs.  Allergies  Allergen Reactions  . Codeine Other (See Comments)    Heart attack.  Family History  Problem Relation Age of Onset  . Diabetes Mother   . Stroke Mother   . Heart failure Father   . Colon cancer Neg Hx   . Esophageal cancer Neg Hx   . Rectal cancer Neg Hx   . Stomach cancer Neg Hx     Prior to Admission medications   Medication Sig Start Date End Date Taking? Authorizing Provider  acetaminophen (TYLENOL) 500 MG tablet Take 500 mg by mouth 2 (two) times daily as needed for headache (pain).    Yes [provider]  albuterol (VENTOLIN HFA) 108 (90 Base) MCG/ACT inhaler Inhale 2 puffs into the  lungs every 4 (four) hours as needed for wheezing or shortness of breath. 12/08/19  Yes Shirley, [place], DO  aspirin 81 MG chewable tablet Chew 81 mg by mouth daily.   Yes [provider]  baclofen (LIORESAL) 10 MG tablet Take 10 mg by mouth 2 (two) times daily as needed for muscle spasms.   Yes [provider]  CALCIUM PO Take 1 tablet by mouth daily.   Yes [provider]  Cholecalciferol (VITAMIN D3) 50 MCG (2000 UT) TABS Take 2,000 Units by mouth daily.   Yes [provider]  empagliflozin (JARDIANCE) 25 MG TABS tablet Take 12.5 mg by mouth daily.   Yes [provider]  famotidine (PEPCID) 20 MG tablet Take 1 tablet (20 mg total) by mouth 2 (two) times daily. Patient taking differently: Take 20 mg by mouth 2 (two) times daily as needed for heartburn or indigestion.  07/20/19  Yes [name], MD  furosemide (LASIX) 20 MG tablet Take 60 mg by mouth 2 (two) times daily.    Yes [provider]  insulin aspart (NOVOLOG FLEXPEN) 100 UNIT/ML FlexPen Inject 40 Units into the skin in the morning and at bedtime.    Yes [provider]  insulin glargine (LANTUS) 100 UNIT/ML injection Inject 80 Units into the skin 2 (two) times daily.    Yes [provider]  lisinopril (ZESTRIL) 5 MG tablet Take 5 mg by mouth daily.   Yes [provider]  metoprolol tartrate (LOPRESSOR) 25 MG tablet TAKE 0.5 TABLETS (12.5 MG TOTAL) BY MOUTH 2 (TWO) TIMES DAILY. 10/26/19  Yes Lorretta Harp, MD  Multiple Vitamin (MULTIVITAMIN WITH MINERALS) TABS tablet Take 1 tablet by mouth daily.   Yes [provider]  nabumetone (RELAFEN) 500 MG tablet Take 1 tablet (500 mg total) by mouth daily. 04/13/19  Yes Suzan Slick, NP  nitroGLYCERIN (NITROSTAT) 0.4 MG SL tablet Place 1 tablet (0.4 mg total) under the tongue every 5 (five) minutes x 3 doses as needed for chest pain. 07/15/18  Yes Kathyrn Drown D, NP  rosuvastatin (CRESTOR) 40 MG  tablet Take 20 mg by mouth daily.   Yes [provider]  sildenafil (VIAGRA) 100 MG tablet Take 100 mg by mouth daily as needed for erectile dysfunction.   Yes [provider]  ticagrelor (BRILINTA) 90 MG TABS tablet Take 1 tablet (90 mg total) by mouth 2 (two) times daily. 10/07/18  Yes Lorretta Harp, MD  atorvastatin (LIPITOR) 80 MG tablet Take 1 tablet (80 mg total) by mouth daily at 6 PM. Patient not taking: Reported on 01/01/2020 07/15/18   Tommie Raymond, NP  fluconazole (DIFLUCAN) 100 MG tablet Take 1 tablet (100 mg total) by mouth daily. Patient not taking: Reported on 01/01/2020 07/20/19   [name], MD  levofloxacin (LEVAQUIN) 750 MG  tablet Take 1 tablet (750 mg total) by mouth daily. For 14 days 07/07/12 07/24/12  Verdie Drown, Samuel Germany, MD    Physical Exam: Constitutional: Moderately built and nourished. Vitals:   12/31/19 2030 12/31/19 2159 01/01/20 0029 01/01/20 0151  BP: 138/65 110/62 127/62   Pulse: 96 83 74   Resp: (!) 22 20 18    Temp: (!) 101.3 F (38.5 C) 99.9 F (37.7 C) 99.4 F (37.4 C)   TempSrc: Oral Oral Oral   SpO2: 100% 95% 98%   Weight:    80.7 kg  Height:    5\' 10"  (1.778 m)   Eyes: Anicteric no pallor. ENMT: No discharge from the ears eyes nose or mouth. Neck: No mass felt.  No neck rigidity. Respiratory: No rhonchi or crepitations. Cardiovascular: S1-S2 heard. Abdomen: Soft nontender bowel sounds present. Musculoskeletal: Bilateral lower extremity edema with some discharge from the right foot stump. Skin: Right foot stump discharge. Neurologic: Alert awake oriented to time place and person.  Moves all extremities. Psychiatric: Appears normal per normal affect.   Labs on Admission: I have personally reviewed following labs and imaging studies  CBC: Recent Labs  Lab 12/31/19 2052  WBC 6.5  NEUTROABS 5.4  HGB 12.8*  HCT 40.0  MCV 91.1  PLT 326*   Basic Metabolic Panel: Recent Labs  Lab 12/31/19 2052  NA 134*  K 4.0   CL 97*  CO2 26  GLUCOSE 223*  BUN 12  CREATININE 1.38*  CALCIUM 8.8*   GFR: Estimated Creatinine Clearance: 55.8 mL/min (A) (by C-G formula based on SCr of 1.38 mg/dL (H)). Liver Function Tests: Recent Labs  Lab 12/31/19 2052  AST 30  ALT 31  ALKPHOS 106  BILITOT 0.9  PROT 6.5  ALBUMIN 3.1*   No results for input(s): LIPASE, AMYLASE in the last 168 hours. No results for input(s): AMMONIA in the last 168 hours. Coagulation Profile: Recent Labs  Lab 12/31/19 2059  INR 1.2   Cardiac Enzymes: No results for input(s): CKTOTAL, CKMB, CKMBINDEX, TROPONINI in the last 168 hours. BNP (last 3 results) No results for input(s): PROBNP in the last 8760 hours. HbA1C: No results for input(s): HGBA1C in the last 72 hours. CBG: No results for input(s): GLUCAP in the last 168 hours. Lipid Profile: No results for input(s): CHOL, HDL, LDLCALC, TRIG, CHOLHDL, LDLDIRECT in the last 72 hours. Thyroid Function Tests: No results for input(s): TSH, T4TOTAL, FREET4, T3FREE, THYROIDAB in the last 72 hours. Anemia Panel: No results for input(s): VITAMINB12, FOLATE, FERRITIN, TIBC, IRON, RETICCTPCT in the last 72 hours. Urine analysis:    Component Value Date/Time   COLORURINE AMBER (A) 12/31/2019 2036   APPEARANCEUR CLEAR 12/31/2019 2036   LABSPEC 1.026 12/31/2019 2036   PHURINE 5.0 12/31/2019 2036   GLUCOSEU 50 (A) 12/31/2019 2036   HGBUR SMALL (A) 12/31/2019 2036   HGBUR negative 04/13/2010 1446   BILIRUBINUR NEGATIVE 12/31/2019 2036   BILIRUBINUR NEG 09/12/2011 1456   [name] 12/31/2019 2036   PROTEINUR 30 (A) 12/31/2019 2036   UROBILINOGEN 4.0 (H) 12/15/2013 2124   NITRITE NEGATIVE 12/31/2019 2036   LEUKOCYTESUR NEGATIVE 12/31/2019 2036   Sepsis Labs: @LABRCNTIP (procalcitonin:4,lacticidven:4) ) Recent Results (from the past 240 hour(s))  SARS Coronavirus 2 by RT PCR (hospital order, performed in [place] hospital lab) Nasopharyngeal Nasopharyngeal Swab      Status: None   Collection Time: 01/01/20  1:33 AM   Specimen: Nasopharyngeal Swab  Result Value Ref Range Status   SARS Coronavirus 2 NEGATIVE  NEGATIVE Final    Comment: (NOTE) SARS-CoV-2 target nucleic acids are NOT DETECTED.  The SARS-CoV-2 RNA is generally detectable in upper and lower respiratory specimens during the acute phase of infection. The lowest concentration of SARS-CoV-2 viral copies this assay can detect is 250 copies / mL. A negative result does not preclude SARS-CoV-2 infection and should not be used as the sole basis for treatment or other patient management decisions.  A negative result may occur with improper specimen collection / handling, submission of specimen other than nasopharyngeal swab, presence of viral mutation(s) within the areas targeted by this assay, and inadequate number of viral copies (<250 copies / mL). A negative result must be combined with clinical observations, patient history, and epidemiological information.  Fact Sheet for Patients:   StrictlyIdeas.no  Fact Sheet for Healthcare Providers: BankingDealers.co.za  This test is not yet approved or  cleared by the [place] FDA and has been authorized for detection and/or diagnosis of SARS-CoV-2 by FDA under an Emergency Use Authorization (EUA).  This EUA will remain in effect (meaning this test can be used) for the duration of the COVID-19 declaration under Section 564(b)(1) of the Act, 21 U.S.C. section 360bbb-3(b)(1), unless the authorization is terminated or revoked sooner.  Performed at [facility], [place] [address] [place], [place] [address]      Radiological Exams on Admission: DG Foot Complete Right  Result Date: 01/01/2020 CLINICAL DATA:  Infection.  Lower extremity edema. EXAM: RIGHT FOOT COMPLETE - 3+ VIEW COMPARISON:  Most recent radiograph 04/13/2019 FINDINGS: Prior transmetatarsal amputation. Resection margins are  smooth. There is no erosion or periosteal reaction. Small focal skin/soft tissue defect overlies the second metatarsal head amputation site. No radiopaque foreign body. Generalized soft tissue edema about the foot. Questionable soft tissue defect about the heel versus overlapping soft tissues. IMPRESSION: 1. Prior transmetatarsal amputation. No radiographic findings of osteomyelitis. 2. Small focal skin/soft tissue defect overlies the second metatarsal head amputation site. Generalized subcutaneous soft tissue edema. 3. Questionable soft tissue defect about the heel versus overlapping soft tissues. Electronically Signed   By: Keith Rake M.D.   On: 01/01/2020 01:16    EKG: Independently reviewed.  Sinus rhythm with PVCs.  Assessment/Plan Principal Problem:   Cellulitis of right foot Active Problems:   Essential hypertension   PAD (peripheral artery disease) (HCC)   COPD, mild (HCC)   Type 2 diabetes mellitus with neurologic complication, with long-term current use of insulin (HCC)   Cellulitis    1. Cellulitis of the right foot with concern for possible abscess and osteomyelitis for which I have ordered MRI of the right foot.  Patient on empiric antibiotics.  Further recommendation based on MRI findings. 2. CAD status post stenting on dual antiplatelet agents beta-blockers and statins. 3. Chronic diastolic CHF on Lasix 60 mg p.o. twice daily. 4. Diabetes mellitus type 2 on Lantus insulin 80 units twice daily and 40 units of NovoLog morning and night. 5. History of alcohol abuse on CIWA. 6. COPD not actively wheezing. 7. History of peripheral vascular disease. 8. Chronic kidney disease stage II creatinine appears to be at baseline.  Note that patient is on Lasix and lisinopril. 9. History of hypertension on lisinopril and metoprolol.  Given that patient has cellulitis of the right foot with possible osteomyelitis will need further work-up and close monitoring and will need more than 2  midnights in inpatient status.  Patient's medications were verified with patient and patient's wife.   DVT prophylaxis:  SCDs for now until we get the MRI results which may need surgical procedure based on the results.  We will avoid anticoagulation for now. Code Status: Full code. Family Communication: Patient's wife. Disposition Plan: To be determined. Consults called: None. Admission status: Inpatient.   Rise Patience MD [facility] Pager [phone].  If 7PM-7AM, please contact night-coverage [email or web address] Password [email or web address]  01/01/2020, 4:19 AM

## 2020-01-01 NOTE — Progress Notes (Addendum)
FPTS interim progress note:  S: Resumed care of Adam Ray shortly after admission from 4 AM this morning, he was admitted for extensive cellulitis of his right lower extremity with concerns for possible abscess formation/osteomyelitis of his transmetatarsal stump.  Intermittently febrile, but not meeting sepsis criteria.  This morning, he reports he is doing okay. Wife is at bedside and requesting that she be able to stay the night with him.  He is particularly anxious about staying unless she is here.  Minimal pain in this leg.   O: Blood pressure (!) 142/74, pulse 73, temperature 99.6 F (37.6 C), resp. rate 20, height 5\' 10"  (1.778 m), weight 86.1 kg, SpO2 100 %. General: Alert, NAD HEENT: NCAT, MMM Cardiac: RRR Lungs: Clear bilaterally, no increased WOB on room air Abdomen: soft Msk: Moves all extremities spontaneously  Ext: Significant warmth to touch to right lower leg to approximate level of knee with 2-3+ pitting edema.  Erythema present as shown in picture below.  Small opening on transmetatarsal stump with purulent drainage present, did not probe opening.  Sensation to light touch intact throughout, foot is warm to palpation.  2+ pitting edema to level of knee present on left lower extremity without any erythema or warmth to touch.     A/P: Extensive cellulitis with concern for possible abscess formation and osteomyelitis of right LE: Acute, worsening. We will continue plan as is including IV vancomycin/Zosyn for empiric coverage and obtain MRI foot to R/O osteomyelitis.  Used skin marker to place cellulitis line to continually assess improvement.  Will place wound care consult.  Plan on narrowing antibiotic regimen tomorrow if responding well.   Otherwise continue home medications for chronic conditions as stated in H&P.  Will monitor glucose closely as suspect his diet is likely drastically different at home, currently reasonable around 180-200s.  Will discuss with charge nurse  to allow patient's wife to stay overnight at this will promote patient adherence to medical management.   Patriciaann Clan, DO

## 2020-01-01 NOTE — Treatment Plan (Signed)
Called by Dr. Higinio Plan from [facility], they'll take Mr. Sculley on their service today. Please let [facility] know if we can be of any further assistance. Will sign off.

## 2020-01-01 NOTE — Progress Notes (Deleted)
FPTS Interim Progress Note  S: paged about [facility] patient being admitted to [facility] in rm 3e10.   A/P: - thank you for notifying fpts of patient admission - per our program policy, we will assume care on following day at 0700 (7/4). - If you have any questions about this policy, please feel free to contact attending on service. Thank you.  Richarda Osmond, DO 01/01/2020, 5:25 AM PGY-3, [facility] pager [phone]

## 2020-01-01 NOTE — Progress Notes (Signed)
Pt's wife didn't want NT to put condom cath on him.  Wife said that she put condom cath on him so staff doesn't need to see his private part.  Insisted that we won't be able to see his bottom even after explaining of skin assessment.

## 2020-01-01 NOTE — ED Provider Notes (Signed)
[facility] EMERGENCY DEPARTMENT Provider Note   CSN: [id number] Arrival date & time: 12/31/19  2024     History Chief Complaint  Patient presents with  . Leg Swelling    Adam Ray is a 64 y.o. male.  Patient presents to the emergency department with a chief complaint of foot infection.  He reports discharge from right midfoot stump for the past several days.  He states that his foot has been more swollen than normal.  States that the swelling is extending up his leg.  He is diabetic.  Has had prior midfoot amputation by Dr. Sharol Given.  He is noted to be febrile to 101.3 in triage.  He denies any fevers at home.  Denies taking any medications for the symptoms.  Denies known injury.  The history is provided by the patient. No language interpreter was used.       Past Medical History:  Diagnosis Date  . Allergy   . Arthritis   . Bronchitis   . Cataract   . Chronic kidney disease   . Claudication [facility])    right foot ray resection  . Colon polyps    hyperplastic  . COPD (chronic obstructive pulmonary disease) ([place])   . Diabetes mellitus   . Genital warts   . Gout   . Hyperlipidemia   . Hypertension   . Osteomyelitis of third toe of right foot ([place])   . Pneumonia   . Status post amputation of toe of right foot ([place]) 09/24/2016    Patient Active Problem List   Diagnosis Date Noted  . Nocturnal leg cramps 10/28/2019  . Hyperlipidemia 05/26/2019  . Edema leg 12/29/2018  . STEMI involving left circumflex coronary artery ([place]) 07/12/2018  . Status post transmetatarsal amputation of foot, right ([place]) 07/08/2018  . Limited mobility 05/12/2018  . Gingival foreign body 07/22/2017  . Anxiety state 06/02/2017  . Leg swelling 01/29/2017  . Idiopathic chronic venous hypertension of both lower extremities with inflammation 09/24/2016  . Testicular mass 04/18/2016  . Diabetic polyneuropathy associated with type 2 diabetes mellitus ([place])   . Type 2 diabetes  mellitus with neurologic complication, with long-term current use of insulin ([place]) 12/08/2015  . Pulmonary nodule 02/01/2015  . Depression 12/09/2013  . Warts, genital 08/20/2013  . 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 Diabetic retinopathy ([place]) 01/28/2013  . Sleep apnea 09/06/2010  . 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 Essential hypertension 11/09/2008  . COPD, mild ([place]) 10/06/2006  . SICKLE-CELL TRAIT 04/26/2006  . ERECTILE DYSFUNCTION 04/26/2006  . Tobacco abuse 04/26/2006  . PAD (peripheral artery disease) ([place]) 04/26/2006  . ALLERGIC RHINITIS 04/26/2006    Past Surgical History:  Procedure Laterality Date  . AMPUTATION Right 01/31/2016   Procedure: Right 2nd Toe Amputation;  Surgeon: Newt Minion, MD;  Location: [place];  Service: Orthopedics;  Laterality: Right;  . AMPUTATION Right 07/08/2018   Procedure: RIGHT TRANSMETATARSAL AMPUTATION;  Surgeon: Newt Minion, MD;  Location: [place];  Service: Orthopedics;  Laterality: Right;  . CATARACT EXTRACTION     right eye  . COLONOSCOPY    . CORONARY STENT INTERVENTION N/A 07/12/2018   Procedure: CORONARY STENT INTERVENTION;  Surgeon: Troy Sine, MD;  Location: [place] [facility];  Service: Cardiovascular;  Laterality: N/A;  . CORONARY/GRAFT ACUTE MI REVASCULARIZATION N/A 07/12/2018   Procedure: Coronary/Graft Acute MI Revascularization;  Surgeon: Troy Sine, MD;  Location: [place] [facility];  Service: Cardiovascular;  Laterality: N/A;  .  I & D EXTREMITY  04/11/2012   Procedure: IRRIGATION AND DEBRIDEMENT EXTREMITY;  Surgeon: Wylene Simmer, MD;  Location: [place];  Service: Orthopedics;  Laterality: Right;  . LEFT HEART CATH AND CORONARY ANGIOGRAPHY N/A 07/12/2018   Procedure: LEFT HEART CATH AND CORONARY ANGIOGRAPHY;  Surgeon: Troy Sine, MD;  Location: [place] [facility];  Service: Cardiovascular;  Laterality: N/A;  . Surgery left great toe    . Tear ducts bilateral eyes    . TRANSMETATARSAL  AMPUTATION Right 07/08/2018       Family History  Problem Relation Age of Onset  . Diabetes Mother   . Stroke Mother   . Heart failure Father   . Colon cancer Neg Hx   . Esophageal cancer Neg Hx   . Rectal cancer Neg Hx   . Stomach cancer Neg Hx     Social History   Tobacco Use  . Smoking status: Current Every Day Smoker    Packs/day: 0.30    Years: 48.00    Pack years: 14.40    Types: Cigars, Cigarettes    Start date: 07/02/1963  . Smokeless tobacco: Former Systems developer  . Tobacco comment: wants to use electric cigarettes.  not interested in pills, worried about chantix side effects  Vaping Use  . Vaping Use: Former  Substance Use Topics  . Alcohol use: Yes    Alcohol/week: 0.0 standard drinks    Comment: occassional use  . Drug use: No    Home Medications Prior to Admission medications   Medication Sig Start Date End Date Taking? Authorizing Provider  acetaminophen (TYLENOL) 500 MG tablet Take 500 mg by mouth 2 (two) times daily as needed for headache (pain).     [provider]  albuterol (VENTOLIN HFA) 108 (90 Base) MCG/ACT inhaler Inhale 2 puffs into the lungs every 4 (four) hours as needed for wheezing or shortness of breath. 12/08/19   Shirley, [place], DO  aspirin 81 MG chewable tablet Chew 81 mg by mouth daily.    [provider]  atorvastatin (LIPITOR) 80 MG tablet Take 1 tablet (80 mg total) by mouth daily at 6 PM. 07/15/18   Tommie Raymond, NP  baclofen (LIORESAL) 10 MG tablet Take 10 mg by mouth 2 (two) times daily as needed for muscle spasms.    [provider]  Cholecalciferol (VITAMIN D3) 50 MCG (2000 UT) TABS Take 2,000 Units by mouth daily.    [provider]  empagliflozin (JARDIANCE) 25 MG TABS tablet Take 12.5 mg by mouth daily.    [provider]  famotidine (PEPCID) 20 MG tablet Take 1 tablet (20 mg total) by mouth 2 (two) times daily. 07/20/19   [name], MD  fluconazole (DIFLUCAN) 100 MG tablet Take 1  tablet (100 mg total) by mouth daily. 07/20/19   [name], MD  furosemide (LASIX) 20 MG tablet Take 60 mg by mouth 2 (two) times daily.     [provider]  insulin aspart (NOVOLOG FLEXPEN) 100 UNIT/ML FlexPen Inject 40 Units into the skin 3 (three) times daily with meals.    [provider]  insulin glargine (LANTUS) 100 UNIT/ML injection Inject 40 Units into the skin 2 (two) times daily.    [provider]  lisinopril (ZESTRIL) 5 MG tablet Take 5 mg by mouth daily.    [provider]  metFORMIN (GLUCOPHAGE-XR) 500 MG 24 hr tablet Take 250 mg by mouth 2 (two) times a day.     [provider]  metoprolol tartrate (LOPRESSOR) 25 MG tablet TAKE 0.5 TABLETS (12.5 MG TOTAL) BY MOUTH 2 (TWO) TIMES DAILY. 10/26/19   Lorretta Harp, MD  nabumetone (RELAFEN) 500 MG tablet Take 1 tablet (500 mg total) by mouth daily. 04/13/19   Suzan Slick, NP  nitroGLYCERIN (NITROSTAT) 0.4 MG SL tablet Place 1 tablet (0.4 mg total) under the tongue every 5 (five) minutes x 3 doses as needed for chest pain. 07/15/18   Kathyrn Drown D, NP  sildenafil (VIAGRA) 100 MG tablet Take 100 mg by mouth daily as needed for erectile dysfunction.    [provider]  ticagrelor (BRILINTA) 90 MG TABS tablet Take 1 tablet (90 mg total) by mouth 2 (two) times daily. Patient taking differently: Take 90 mg by mouth daily.  10/07/18   Lorretta Harp, MD  levofloxacin (LEVAQUIN) 750 MG tablet Take 1 tablet (750 mg total) by mouth daily. For 14 days 07/07/12 07/24/12  Verdie Drown, Samuel Germany, MD    Allergies    Codeine  Review of Systems   Review of Systems  All other systems reviewed and are negative.   Physical Exam Updated Vital Signs BP 127/62 (BP Location: Right Arm)   Pulse 74   Temp 99.4 F (37.4 C) (Oral)   Resp 18   SpO2 98%   Physical Exam Vitals and nursing note reviewed.  Constitutional:      Appearance: He is well-developed.  HENT:     Head:  Normocephalic and atraumatic.  Eyes:     Conjunctiva/sclera: Conjunctivae normal.  Cardiovascular:     Rate and Rhythm: Normal rate and regular rhythm.     Heart sounds: No murmur heard.   Pulmonary:     Effort: Pulmonary effort is normal. No respiratory distress.     Breath sounds: Normal breath sounds.  Abdominal:     Palpations: Abdomen is soft.     Tenderness: There is no abdominal tenderness.  Musculoskeletal:     Cervical back: Neck supple.     Comments: Right midfoot amputation Significant swelling and erythema as pictured  Skin:    General: Skin is warm and dry.  Neurological:     Mental Status: He is alert.       ED Results / Procedures / Treatments   Labs (all labs ordered are listed, but only abnormal results are displayed) Labs Reviewed  LACTIC ACID, PLASMA - Abnormal; Notable for the following components:      Result Value   Lactic Acid, Venous 2.0 (*)    All other components within normal limits  COMPREHENSIVE METABOLIC PANEL - Abnormal; Notable for the following components:   Sodium 134 (*)    Chloride 97 (*)    Glucose, Bld 223 (*)    Creatinine, Ser 1.38 (*)    Calcium 8.8 (*)    Albumin 3.1 (*)    GFR calc non Af Amer 54 (*)    All other components within normal limits  CBC WITH DIFFERENTIAL/PLATELET - Abnormal; Notable for the following components:   Hemoglobin 12.8 (*)    Platelets 143 (*)    Lymphs Abs 0.5 (*)    All other components within normal limits  URINALYSIS, ROUTINE W REFLEX MICROSCOPIC - Abnormal; Notable for the following components:   Color, Urine AMBER (*)    Glucose, UA 50 (*)    Hgb urine dipstick SMALL (*)    Protein, ur 30 (*)    All other components within normal limits  CULTURE,  BLOOD (ROUTINE X 2)  CULTURE, BLOOD (ROUTINE X 2)  URINE CULTURE  PROTIME-INR  LACTIC ACID, PLASMA  APTT    EKG None  Radiology No results found.  Procedures .Critical Care Performed by: Montine Circle, PA-C Authorized by:  Montine Circle, PA-C   Critical care provider statement:    Critical care time (minutes):  35   Critical care was necessary to treat or prevent imminent or life-threatening deterioration of the following conditions:  Sepsis   Critical care was time spent personally by me on the following activities:  Discussions with consultants, evaluation of patient's response to treatment, examination of patient, ordering and performing treatments and interventions, ordering and review of laboratory studies, ordering and review of radiographic studies, pulse oximetry, re-evaluation of patient's condition, obtaining history from patient or surrogate and review of old charts   (including critical care time)  Medications Ordered in ED Medications  sodium chloride flush (NS) 0.9 % injection 3 mL (has no administration in time range)  ceFEPIme (MAXIPIME) 2 g in sodium chloride 0.9 % 100 mL IVPB (has no administration in time range)  metroNIDAZOLE (FLAGYL) IVPB 500 mg (has no administration in time range)  vancomycin (VANCOCIN) IVPB 1000 mg/200 mL premix (has no administration in time range)  acetaminophen (TYLENOL) tablet 650 mg (650 mg Oral Given 12/31/19 2040)    ED Course  I have reviewed the triage vital signs and the nursing notes.  Pertinent labs & imaging results that were available during my care of the patient were reviewed by me and considered in my medical decision making (see chart for details).    MDM Rules/Calculators/A&P                          This patient complains of right leg infection, this involves an extensive number of treatment options, and is a complaint that carries with it a high risk of complications and morbidity.  The differential diagnosis includes sepsis, osteomyelitis, cellulitis, diabetic foot infection.  Pertinent Labs I ordered, reviewed, and interpreted labs, which included CBG 229, WBC 7.0, Lactic 2.0->1.1, Cr 1.25.  Imaging Interpretation I ordered imaging  studies which included right foot, which showed no findings of acute osteomyelitis.   Medications I ordered medication broad spectrum antibiotics for foot infection.  Consultants Dr. Hal Hope - [facility], who is appreciated for admitting the patient.  Critical Interventions  Broad spectrum antibiotics.  Reassessments After the interventions stated above, I reevaluated the patient and found with stable vitals.  Stable for admission.  Agreeable with plan.  Final Clinical Impression(s) / ED Diagnoses Final diagnoses:  Cellulitis of right lower extremity    Rx / DC Orders ED Discharge Orders    None       Montine Circle, PA-C 01/01/20 [place], Delice Bison, DO 01/01/20 [id number]

## 2020-01-02 DIAGNOSIS — J449 Chronic obstructive pulmonary disease, unspecified: Secondary | ICD-10-CM

## 2020-01-02 DIAGNOSIS — I10 Essential (primary) hypertension: Secondary | ICD-10-CM

## 2020-01-02 DIAGNOSIS — I739 Peripheral vascular disease, unspecified: Secondary | ICD-10-CM

## 2020-01-02 DIAGNOSIS — L02611 Cutaneous abscess of right foot: Secondary | ICD-10-CM

## 2020-01-02 DIAGNOSIS — L03115 Cellulitis of right lower limb: Secondary | ICD-10-CM

## 2020-01-02 LAB — CBC WITH DIFFERENTIAL/PLATELET
Abs Immature Granulocytes: 0.05 10*3/uL (ref 0.00–0.07)
Basophils Absolute: 0.1 10*3/uL (ref 0.0–0.1)
Basophils Relative: 1 %
Eosinophils Absolute: 0.1 10*3/uL (ref 0.0–0.5)
Eosinophils Relative: 2 %
HCT: 37.9 % — ABNORMAL LOW (ref 39.0–52.0)
Hemoglobin: 12.6 g/dL — ABNORMAL LOW (ref 13.0–17.0)
Immature Granulocytes: 1 %
Lymphocytes Relative: 22 %
Lymphs Abs: 1.2 10*3/uL (ref 0.7–4.0)
MCH: 29.5 pg (ref 26.0–34.0)
MCHC: 33.2 g/dL (ref 30.0–36.0)
MCV: 88.8 fL (ref 80.0–100.0)
Monocytes Absolute: 0.8 10*3/uL (ref 0.1–1.0)
Monocytes Relative: 15 %
Neutro Abs: 3.2 10*3/uL (ref 1.7–7.7)
Neutrophils Relative %: 59 %
Platelets: 154 10*3/uL (ref 150–400)
RBC: 4.27 MIL/uL (ref 4.22–5.81)
RDW: 12.8 % (ref 11.5–15.5)
WBC: 5.4 10*3/uL (ref 4.0–10.5)
nRBC: 0 % (ref 0.0–0.2)

## 2020-01-02 LAB — GLUCOSE, CAPILLARY
Glucose-Capillary: 67 mg/dL — ABNORMAL LOW (ref 70–99)
Glucose-Capillary: 72 mg/dL (ref 70–99)
Glucose-Capillary: 124 mg/dL — ABNORMAL HIGH (ref 70–99)
Glucose-Capillary: 136 mg/dL — ABNORMAL HIGH (ref 70–99)
Glucose-Capillary: 157 mg/dL — ABNORMAL HIGH (ref 70–99)
Glucose-Capillary: 132 mg/dL — ABNORMAL HIGH (ref 70–99)

## 2020-01-02 LAB — BASIC METABOLIC PANEL
Anion gap: 13 (ref 5–15)
BUN: 14 mg/dL (ref 8–23)
CO2: 25 mmol/L (ref 22–32)
Calcium: 8.7 mg/dL — ABNORMAL LOW (ref 8.9–10.3)
Chloride: 96 mmol/L — ABNORMAL LOW (ref 98–111)
Creatinine, Ser: 1.41 mg/dL — ABNORMAL HIGH (ref 0.61–1.24)
GFR calc Af Amer: >60 mL/min (ref >60)
GFR calc non Af Amer: 52 mL/min — ABNORMAL LOW (ref >60)
Glucose, Bld: 89 mg/dL (ref 70–99)
Potassium: 3.3 mmol/L — ABNORMAL LOW (ref 3.5–5.1)
Sodium: 134 mmol/L — ABNORMAL LOW (ref 135–145)

## 2020-01-02 LAB — URINE CULTURE: Culture: NO GROWTH

## 2020-01-02 MED ORDER — INSULIN GLARGINE 100 UNIT/ML ~~LOC~~ SOLN
60.0000 [IU] | Freq: Two times a day (BID) | SUBCUTANEOUS | Status: DC
  Administered 2020-01-02 – 2020-01-03 (×3): 60 [IU] via SUBCUTANEOUS
  Filled 2020-01-02 (×5): qty 0.6

## 2020-01-02 MED ORDER — ENOXAPARIN SODIUM 40 MG/0.4ML ~~LOC~~ SOLN
40.0000 mg | SUBCUTANEOUS | Status: DC
  Administered 2020-01-02 – 2020-01-03 (×2): 40 mg via SUBCUTANEOUS
  Filled 2020-01-02 (×3): qty 0.4

## 2020-01-02 MED ORDER — VANCOMYCIN HCL 750 MG/150ML IV SOLN
750.0000 mg | Freq: Two times a day (BID) | INTRAVENOUS | Status: DC
  Administered 2020-01-02 – 2020-01-06 (×8): 750 mg via INTRAVENOUS
  Filled 2020-01-02 (×11): qty 150

## 2020-01-02 MED ORDER — FUROSEMIDE 40 MG PO TABS
60.0000 mg | ORAL_TABLET | Freq: Every day | ORAL | Status: DC
  Administered 2020-01-03: 60 mg via ORAL
  Filled 2020-01-02: qty 1

## 2020-01-02 MED ORDER — POTASSIUM CHLORIDE CRYS ER 20 MEQ PO TBCR
30.0000 meq | EXTENDED_RELEASE_TABLET | ORAL | Status: AC
  Administered 2020-01-02 (×2): 30 meq via ORAL
  Filled 2020-01-02 (×2): qty 1

## 2020-01-02 NOTE — Progress Notes (Signed)
Pharmacy Antibiotic Note  Adam Ray is a 64 y.o. male admitted on 12/31/2019 with RLE infection/osteomyelitis as shown on foot MRI.  Pharmacy has been consulted for Vancomycin and Zosyn dosing. WBC remains WNL, fevers resolved. Renal function worsened from yesterday with Scr bump from 1.25 to 1.41, continues to make urine.  Plan: Adjust vancomycin to 750mg  IV Q12 hrs Continue Zosyn 3.375mg  IV Q8 hrs (4-hr infusion) Monitor renal function, cultures, clinical progression F/u ABX LOT and OM plan  Height: 5\' 10"  (177.8 cm) Weight: 84.2 kg (185 lb 9.6 oz) IBW/kg (Calculated) : 73  Temp (24hrs), Avg:99 F (37.2 C), Min:98.3 F (36.8 C), Max:99.6 F (37.6 C)  Recent Labs  Lab 12/31/19 2052 01/01/20 0054 01/01/20 0432 01/02/20 0405  WBC 6.5  --  7.0 5.4  CREATININE 1.38*  --  1.25* 1.41*  LATICACIDVEN 2.0* 1.1  --   --     Estimated Creatinine Clearance: 54.6 mL/min (A) (by C-G formula based on SCr of 1.41 mg/dL (H)).    Allergies  Allergen Reactions  . Codeine Other (See Comments)    Heart attack.    Antimicrobials this admission: Cefepime 7/3 x1 Flagyl 7/3 x1 Vancomycin 7/3 >>  Zosyn 7/3 >>   Dose adjustments this admission: 7/4: Vancomycin 1000mg  Q12 hrs > 750mg  Q12 hrs  Microbiology results: 7/2 BCx: ngtd 7/2 UCx:    Richardine Service, PharmD PGY2 Cardiology Pharmacy Resident Phone: [phone] 01/02/2020  10:25 AM  Please check [email or web address] for unit-specific pharmacy phone numbers.

## 2020-01-02 NOTE — Consult Note (Signed)
ORTHOPAEDIC CONSULTATION  REQUESTING PHYSICIAN: [name], MD  Chief Complaint: Swelling pain and cellulitis right leg.  HPI: Adam Ray is a 64 y.o. male who presents with diabetic insensate neuropathy and a history of a right transmetatarsal amputation patient complains of increasing pain swelling and cellulitis of the right lower extremity.  Past Medical History:  Diagnosis Date  . Allergy   . Arthritis   . Bronchitis   . Cataract   . Chronic kidney disease   . [facility])    right foot ray resection  . Colon polyps    hyperplastic  . COPD (chronic obstructive pulmonary disease) ([place])   . Diabetes mellitus   . Genital warts   . Gout   . Hyperlipidemia   . Hypertension   . Osteomyelitis of third toe of right foot ([place])   . Pneumonia   . Status post amputation of toe of right foot ([place]) 09/24/2016   Past Surgical History:  Procedure Laterality Date  . AMPUTATION Right 01/31/2016   Procedure: Right 2nd Toe Amputation;  Surgeon: Newt Minion, MD;  Location: [place];  Service: Orthopedics;  Laterality: Right;  . AMPUTATION Right 07/08/2018   Procedure: RIGHT TRANSMETATARSAL AMPUTATION;  Surgeon: Newt Minion, MD;  Location: [place];  Service: Orthopedics;  Laterality: Right;  . CATARACT EXTRACTION     right eye  . COLONOSCOPY    . CORONARY STENT INTERVENTION N/A 07/12/2018   Procedure: CORONARY STENT INTERVENTION;  Surgeon: Troy Sine, MD;  Location: [place] [facility];  Service: Cardiovascular;  Laterality: N/A;  . CORONARY/GRAFT ACUTE MI REVASCULARIZATION N/A 07/12/2018   Procedure: Coronary/Graft Acute MI Revascularization;  Surgeon: Troy Sine, MD;  Location: [place] [facility];  Service: Cardiovascular;  Laterality: N/A;  . I & D EXTREMITY  04/11/2012   Procedure: IRRIGATION AND DEBRIDEMENT EXTREMITY;  Surgeon: Wylene Simmer, MD;  Location: [place];  Service: Orthopedics;  Laterality: Right;  . LEFT HEART CATH AND CORONARY ANGIOGRAPHY N/A 07/12/2018    Procedure: LEFT HEART CATH AND CORONARY ANGIOGRAPHY;  Surgeon: Troy Sine, MD;  Location: [place] [facility];  Service: Cardiovascular;  Laterality: N/A;  . Surgery left great toe    . Tear ducts bilateral eyes    . TRANSMETATARSAL AMPUTATION Right 07/08/2018   Social History   Socioeconomic History  . Marital status: Married    Spouse name: Not on file  . Number of children: 3  . Years of education: Not on file  . Highest education level: Not on file  Occupational History  . Occupation: disabled  Tobacco Use  . Smoking status: Current Every Day Smoker    Packs/day: 0.30    Years: 48.00    Pack years: 14.40    Types: Cigars, Cigarettes    Start date: 07/02/1963  . Smokeless tobacco: Former Systems developer  . Tobacco comment: wants to use electric cigarettes.  not interested in pills, worried about chantix side effects  Vaping Use  . Vaping Use: Former  Substance and Sexual Activity  . Alcohol use: Yes    Alcohol/week: 0.0 standard drinks    Comment: occassional use  . Drug use: No  . Sexual activity: Not on file  Other Topics Concern  . Not on file  Social History Narrative  . Not on file   Social Determinants of Health   Financial Resource Strain:   . Difficulty of Paying Living Expenses:   Food Insecurity:   . Worried About Running  Out of Food in the Last Year:   . [place] in the Last Year:   Transportation Needs:   . Lack of Transportation (Medical):   [name] Lack of Transportation (Non-Medical):   Physical Activity:   . Days of Exercise per Week:   . Minutes of Exercise per Session:   Stress:   . Feeling of Stress :   Social Connections:   . Frequency of Communication with Friends and Family:   . Frequency of Social Gatherings with Friends and Family:   . Attends Religious Services:   . Active Member of Clubs or Organizations:   . Attends Archivist Meetings:   [name] Marital Status:    Family History  Problem Relation Age of Onset  . Diabetes  Mother   . Stroke Mother   . Heart failure Father   . Colon cancer Neg Hx   . Esophageal cancer Neg Hx   . Rectal cancer Neg Hx   . Stomach cancer Neg Hx    - negative except otherwise stated in the family history section Allergies  Allergen Reactions  . Codeine Other (See Comments)    Heart attack.   Prior to Admission medications   Medication Sig Start Date End Date Taking? Authorizing Provider  acetaminophen (TYLENOL) 500 MG tablet Take 500 mg by mouth 2 (two) times daily as needed for headache (pain).    Yes [provider]  albuterol (VENTOLIN HFA) 108 (90 Base) MCG/ACT inhaler Inhale 2 puffs into the lungs every 4 (four) hours as needed for wheezing or shortness of breath. 12/08/19  Yes Shirley, [place], DO  aspirin 81 MG chewable tablet Chew 81 mg by mouth daily.   Yes [provider]  baclofen (LIORESAL) 10 MG tablet Take 10 mg by mouth 2 (two) times daily as needed for muscle spasms.   Yes [provider]  CALCIUM PO Take 1 tablet by mouth daily.   Yes [provider]  Cholecalciferol (VITAMIN D3) 50 MCG (2000 UT) TABS Take 2,000 Units by mouth daily.   Yes [provider]  empagliflozin (JARDIANCE) 25 MG TABS tablet Take 12.5 mg by mouth daily.   Yes [provider]  famotidine (PEPCID) 20 MG tablet Take 1 tablet (20 mg total) by mouth 2 (two) times daily. Patient taking differently: Take 20 mg by mouth 2 (two) times daily as needed for heartburn or indigestion.  07/20/19  Yes [name], MD  furosemide (LASIX) 20 MG tablet Take 60 mg by mouth 2 (two) times daily.    Yes [provider]  insulin aspart (NOVOLOG FLEXPEN) 100 UNIT/ML FlexPen Inject 40 Units into the skin in the morning and at bedtime.    Yes [provider]  insulin glargine (LANTUS) 100 UNIT/ML injection Inject 80 Units into the skin 2 (two) times daily.    Yes [provider]  lisinopril (ZESTRIL) 5 MG tablet Take 5 mg by  mouth daily.   Yes [provider]  metoprolol tartrate (LOPRESSOR) 25 MG tablet TAKE 0.5 TABLETS (12.5 MG TOTAL) BY MOUTH 2 (TWO) TIMES DAILY. 10/26/19  Yes Lorretta Harp, MD  Multiple Vitamin (MULTIVITAMIN WITH MINERALS) TABS tablet Take 1 tablet by mouth daily.   Yes [provider]  nabumetone (RELAFEN) 500 MG tablet Take 1 tablet (500 mg total) by mouth daily. 04/13/19  Yes Dondra Prader R, NP  nitroGLYCERIN (NITROSTAT) 0.4 MG SL tablet Place 1 tablet (0.4 mg total) under the tongue every 5 (  five) minutes x 3 doses as needed for chest pain. 07/15/18  Yes Kathyrn Drown D, NP  rosuvastatin (CRESTOR) 40 MG tablet Take 20 mg by mouth daily.   Yes [provider]  sildenafil (VIAGRA) 100 MG tablet Take 100 mg by mouth daily as needed for erectile dysfunction.   Yes [provider]  ticagrelor (BRILINTA) 90 MG TABS tablet Take 1 tablet (90 mg total) by mouth 2 (two) times daily. 10/07/18  Yes Lorretta Harp, MD  atorvastatin (LIPITOR) 80 MG tablet Take 1 tablet (80 mg total) by mouth daily at 6 PM. Patient not taking: Reported on 01/01/2020 07/15/18   Tommie Raymond, NP  fluconazole (DIFLUCAN) 100 MG tablet Take 1 tablet (100 mg total) by mouth daily. Patient not taking: Reported on 01/01/2020 07/20/19   [name], MD  levofloxacin (LEVAQUIN) 750 MG tablet Take 1 tablet (750 mg total) by mouth daily. For 14 days 07/07/12 07/24/12  Verdie Drown, Samuel Germany, MD   MR FOOT RIGHT WO CONTRAST  Result Date: 01/02/2020 CLINICAL DATA:  Diabetic patient with a skin ulceration on the stump of the right foot. History of prior transmetatarsal amputation. EXAM: MRI OF THE RIGHT ANKLE WITHOUT CONTRAST TECHNIQUE: Multiplanar, multisequence MR imaging of the ankle was performed. No intravenous contrast was administered. COMPARISON:  Plain films right ankle 01/01/2020. FINDINGS: Bones/Joint/Cartilage Postoperative change of transmetatarsal amputation and amputation of the first  metatarsal again seen. Marrow edema is seen in approximately the distal 3.5 cm of the second metatarsal remnant. There is also marrow edema in the third metatarsal remnant which is most intense in the distal 1.2 cm. A milder degree of edema in the more proximal third metatarsal is likely reactive. No fracture or dislocation. Subtalar osteoarthritis noted. No evidence of septic joint. Ligaments Negative. Muscles and Tendons No intramuscular fluid collection or evidence of septic tenosynovitis. Soft tissues Intense subcutaneous edema is present about the visualized ankle and foot. There is a skin ulceration at the distal second metatarsal. Focal fluid deep to the ulceration and dorsal to the distal second metatarsal measures 2 cm long by 2.2 cm transverse by 1 cm craniocaudal. IMPRESSION: Skin ulceration on the patient's stump at the second metatarsal with an underlying abscess extending dorsal to the second metatarsal as described. Osteomyelitis in the distal 3.5 cm of the second metatarsal. Marrow edema in the distal 1.2 cm of the third metatarsal is also likely due to osteomyelitis. 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 Electronically Signed   By: Inge Rise M.D.   On: 01/02/2020 09:19   DG Foot Complete Right  Result Date: 01/01/2020 CLINICAL DATA:  Infection.  Lower extremity edema. EXAM: RIGHT FOOT COMPLETE - 3+ VIEW COMPARISON:  Most recent radiograph 04/13/2019 FINDINGS: Prior transmetatarsal amputation. Resection margins are smooth. There is no erosion or periosteal reaction. Small focal skin/soft tissue defect overlies the second metatarsal head amputation site. No radiopaque foreign body. Generalized soft tissue edema about the foot. Questionable soft tissue defect about the heel versus overlapping soft tissues. IMPRESSION: 1. Prior transmetatarsal amputation. No radiographic findings of osteomyelitis. 2. Small focal skin/soft tissue defect overlies  the second metatarsal head amputation site. Generalized subcutaneous soft tissue edema. 3. Questionable soft tissue defect about the heel versus overlapping soft tissues. Electronically Signed   By: Keith Rake M.D.   On: 01/01/2020 01:16   - pertinent xrays, CT, MRI studies were reviewed and independently interpreted  Positive ROS: All other systems have been  reviewed and were otherwise negative with the exception of those mentioned in the HPI and as above.  Physical Exam: General: Alert, no acute distress Psychiatric: Patient is competent for consent with normal mood and affect Lymphatic: No axillary or cervical lymphadenopathy Cardiovascular: No pedal edema Respiratory: No cyanosis, no use of accessory musculature GI: No organomegaly, abdomen is soft and non-tender    Images:  @ENCIMAGES @  Labs:  Lab Results  Component Value Date   HGBA1C 7.2 (A) 10/28/2019   HGBA1C 7.6 (A) 05/31/2019   HGBA1C 7.6 (H) 01/05/2019   ESRSEDRATE 54 (H) 01/01/2020   ESRSEDRATE 8 01/29/2016   ESRSEDRATE 9 04/11/2012   CRP 2.6 (H) 01/29/2016   CRP 0.5 02/28/2014   REPTSTATUS PENDING 12/31/2019   GRAMSTAIN  04/11/2012    NO WBC SEEN RARE SQUAMOUS EPITHELIAL CELLS PRESENT MODERATE GRAM POSITIVE COCCI IN PAIRS RARE GRAM NEGATIVE RODS   GRAMSTAIN  04/11/2012    NO WBC SEEN RARE SQUAMOUS EPITHELIAL CELLS PRESENT MODERATE GRAM POSITIVE COCCI IN PAIRS RARE GRAM NEGATIVE RODS   CULT  12/31/2019    NO GROWTH < 24 HOURS Performed at [facility], [address], [place], [place] [address]    LABORGA NO GROWTH 2 DAYS 07/19/2014    Lab Results  Component Value Date   ALBUMIN 3.1 (L) 12/31/2019   ALBUMIN 3.5 10/18/2018   ALBUMIN 3.5 07/12/2018    Neurologic: Patient does not have protective sensation bilateral lower extremities.   MUSCULOSKELETAL:   Skin: Examination patient has cellulitis and massive swelling of the right lower extremity the calf and foot are tender to  palpation.  Patient has an ulcer over the distal aspect of the transmetatarsal amputation this was debrided at bedside there was a large purulent abscess that was decompressed the second metatarsal it is palpable.  I cannot palpate pulses secondary to the significant swelling.  Review of the radiographs shows no destructive bony changes review of the MRI scan shows the abscess and shows the osteomyelitis of the exposed second metatarsal.  Assessment: Assessment: Diabetic insensate neuropathy with abscess and osteomyelitis right foot with a transmetatarsal amputation.  Plan: I discussed with the patient and his wife that he does not have sufficient soft tissue that is not involved with the infection to proceed with foot salvage intervention.  Recommended that patient proceed with a transtibial amputation on Wednesday.  Patient will need the IV antibiotics prior to surgery to try to decrease the cellulitis in the leg.  The abscess has been decompressed this should help with decompressing the cellulitis.  Continue IV antibiotics until surgery.  Thank you for the consult and the opportunity to see Mr. [place], MD [facility] [phone] 10:43 AM

## 2020-01-02 NOTE — H&P (View-Only) (Signed)
ORTHOPAEDIC CONSULTATION  REQUESTING PHYSICIAN: [name], MD  Chief Complaint: Swelling pain and cellulitis right leg.  HPI: Adam Ray is a 64 y.o. male who presents with diabetic insensate neuropathy and a history of a right transmetatarsal amputation patient complains of increasing pain swelling and cellulitis of the right lower extremity.  Past Medical History:  Diagnosis Date  . Allergy   . Arthritis   . Bronchitis   . Cataract   . Chronic kidney disease   . Claudication [facility])    right foot ray resection  . Colon polyps    hyperplastic  . COPD (chronic obstructive pulmonary disease) ([place])   . Diabetes mellitus   . Genital warts   . Gout   . Hyperlipidemia   . Hypertension   . Osteomyelitis of third toe of right foot ([place])   . Pneumonia   . Status post amputation of toe of right foot ([place]) 09/24/2016   Past Surgical History:  Procedure Laterality Date  . AMPUTATION Right 01/31/2016   Procedure: Right 2nd Toe Amputation;  Surgeon: Newt Minion, MD;  Location: [place];  Service: Orthopedics;  Laterality: Right;  . AMPUTATION Right 07/08/2018   Procedure: RIGHT TRANSMETATARSAL AMPUTATION;  Surgeon: Newt Minion, MD;  Location: [place];  Service: Orthopedics;  Laterality: Right;  . CATARACT EXTRACTION     right eye  . COLONOSCOPY    . CORONARY STENT INTERVENTION N/A 07/12/2018   Procedure: CORONARY STENT INTERVENTION;  Surgeon: Troy Sine, MD;  Location: [place] [facility];  Service: Cardiovascular;  Laterality: N/A;  . CORONARY/GRAFT ACUTE MI REVASCULARIZATION N/A 07/12/2018   Procedure: Coronary/Graft Acute MI Revascularization;  Surgeon: Troy Sine, MD;  Location: [place] [facility];  Service: Cardiovascular;  Laterality: N/A;  . I & D EXTREMITY  04/11/2012   Procedure: IRRIGATION AND DEBRIDEMENT EXTREMITY;  Surgeon: Wylene Simmer, MD;  Location: [place];  Service: Orthopedics;  Laterality: Right;  . LEFT HEART CATH AND CORONARY ANGIOGRAPHY N/A 07/12/2018    Procedure: LEFT HEART CATH AND CORONARY ANGIOGRAPHY;  Surgeon: Troy Sine, MD;  Location: [place] [facility];  Service: Cardiovascular;  Laterality: N/A;  . Surgery left great toe    . Tear ducts bilateral eyes    . TRANSMETATARSAL AMPUTATION Right 07/08/2018   Social History   Socioeconomic History  . Marital status: Married    Spouse name: Not on file  . Number of children: 3  . Years of education: Not on file  . Highest education level: Not on file  Occupational History  . Occupation: disabled  Tobacco Use  . Smoking status: Current Every Day Smoker    Packs/day: 0.30    Years: 48.00    Pack years: 14.40    Types: Cigars, Cigarettes    Start date: 07/02/1963  . Smokeless tobacco: Former Systems developer  . Tobacco comment: wants to use electric cigarettes.  not interested in pills, worried about chantix side effects  Vaping Use  . Vaping Use: Former  Substance and Sexual Activity  . Alcohol use: Yes    Alcohol/week: 0.0 standard drinks    Comment: occassional use  . Drug use: No  . Sexual activity: Not on file  Other Topics Concern  . Not on file  Social History Narrative  . Not on file   Social Determinants of Health   Financial Resource Strain:   . Difficulty of Paying Living Expenses:   Food Insecurity:   . Worried About Running  Out of Food in the Last Year:   . [place] in the Last Year:   Transportation Needs:   . Lack of Transportation (Medical):   [name] Lack of Transportation (Non-Medical):   Physical Activity:   . Days of Exercise per Week:   . Minutes of Exercise per Session:   Stress:   . Feeling of Stress :   Social Connections:   . Frequency of Communication with Friends and Family:   . Frequency of Social Gatherings with Friends and Family:   . Attends Religious Services:   . Active Member of Clubs or Organizations:   . Attends Archivist Meetings:   [name] Marital Status:    Family History  Problem Relation Age of Onset  . Diabetes  Mother   . Stroke Mother   . Heart failure Father   . Colon cancer Neg Hx   . Esophageal cancer Neg Hx   . Rectal cancer Neg Hx   . Stomach cancer Neg Hx    - negative except otherwise stated in the family history section Allergies  Allergen Reactions  . Codeine Other (See Comments)    Heart attack.   Prior to Admission medications   Medication Sig Start Date End Date Taking? Authorizing Provider  acetaminophen (TYLENOL) 500 MG tablet Take 500 mg by mouth 2 (two) times daily as needed for headache (pain).    Yes [provider]  albuterol (VENTOLIN HFA) 108 (90 Base) MCG/ACT inhaler Inhale 2 puffs into the lungs every 4 (four) hours as needed for wheezing or shortness of breath. 12/08/19  Yes Shirley, [place], DO  aspirin 81 MG chewable tablet Chew 81 mg by mouth daily.   Yes [provider]  baclofen (LIORESAL) 10 MG tablet Take 10 mg by mouth 2 (two) times daily as needed for muscle spasms.   Yes [provider]  CALCIUM PO Take 1 tablet by mouth daily.   Yes [provider]  Cholecalciferol (VITAMIN D3) 50 MCG (2000 UT) TABS Take 2,000 Units by mouth daily.   Yes [provider]  empagliflozin (JARDIANCE) 25 MG TABS tablet Take 12.5 mg by mouth daily.   Yes [provider]  famotidine (PEPCID) 20 MG tablet Take 1 tablet (20 mg total) by mouth 2 (two) times daily. Patient taking differently: Take 20 mg by mouth 2 (two) times daily as needed for heartburn or indigestion.  07/20/19  Yes [name], MD  furosemide (LASIX) 20 MG tablet Take 60 mg by mouth 2 (two) times daily.    Yes [provider]  insulin aspart (NOVOLOG FLEXPEN) 100 UNIT/ML FlexPen Inject 40 Units into the skin in the morning and at bedtime.    Yes [provider]  insulin glargine (LANTUS) 100 UNIT/ML injection Inject 80 Units into the skin 2 (two) times daily.    Yes [provider]  lisinopril (ZESTRIL) 5 MG tablet Take 5 mg by  mouth daily.   Yes [provider]  metoprolol tartrate (LOPRESSOR) 25 MG tablet TAKE 0.5 TABLETS (12.5 MG TOTAL) BY MOUTH 2 (TWO) TIMES DAILY. 10/26/19  Yes Lorretta Harp, MD  Multiple Vitamin (MULTIVITAMIN WITH MINERALS) TABS tablet Take 1 tablet by mouth daily.   Yes [provider]  nabumetone (RELAFEN) 500 MG tablet Take 1 tablet (500 mg total) by mouth daily. 04/13/19  Yes Dondra Prader R, NP  nitroGLYCERIN (NITROSTAT) 0.4 MG SL tablet Place 1 tablet (0.4 mg total) under the tongue every 5 (  five) minutes x 3 doses as needed for chest pain. 07/15/18  Yes Kathyrn Drown D, NP  rosuvastatin (CRESTOR) 40 MG tablet Take 20 mg by mouth daily.   Yes [provider]  sildenafil (VIAGRA) 100 MG tablet Take 100 mg by mouth daily as needed for erectile dysfunction.   Yes [provider]  ticagrelor (BRILINTA) 90 MG TABS tablet Take 1 tablet (90 mg total) by mouth 2 (two) times daily. 10/07/18  Yes Lorretta Harp, MD  atorvastatin (LIPITOR) 80 MG tablet Take 1 tablet (80 mg total) by mouth daily at 6 PM. Patient not taking: Reported on 01/01/2020 07/15/18   Tommie Raymond, NP  fluconazole (DIFLUCAN) 100 MG tablet Take 1 tablet (100 mg total) by mouth daily. Patient not taking: Reported on 01/01/2020 07/20/19   [name], MD  levofloxacin (LEVAQUIN) 750 MG tablet Take 1 tablet (750 mg total) by mouth daily. For 14 days 07/07/12 07/24/12  Verdie Drown, Samuel Germany, MD   MR FOOT RIGHT WO CONTRAST  Result Date: 01/02/2020 CLINICAL DATA:  Diabetic patient with a skin ulceration on the stump of the right foot. History of prior transmetatarsal amputation. EXAM: MRI OF THE RIGHT ANKLE WITHOUT CONTRAST TECHNIQUE: Multiplanar, multisequence MR imaging of the ankle was performed. No intravenous contrast was administered. COMPARISON:  Plain films right ankle 01/01/2020. FINDINGS: Bones/Joint/Cartilage Postoperative change of transmetatarsal amputation and amputation of the first  metatarsal again seen. Marrow edema is seen in approximately the distal 3.5 cm of the second metatarsal remnant. There is also marrow edema in the third metatarsal remnant which is most intense in the distal 1.2 cm. A milder degree of edema in the more proximal third metatarsal is likely reactive. No fracture or dislocation. Subtalar osteoarthritis noted. No evidence of septic joint. Ligaments Negative. Muscles and Tendons No intramuscular fluid collection or evidence of septic tenosynovitis. Soft tissues Intense subcutaneous edema is present about the visualized ankle and foot. There is a skin ulceration at the distal second metatarsal. Focal fluid deep to the ulceration and dorsal to the distal second metatarsal measures 2 cm long by 2.2 cm transverse by 1 cm craniocaudal. IMPRESSION: Skin ulceration on the patient's stump at the second metatarsal with an underlying abscess extending dorsal to the second metatarsal as described. Osteomyelitis in the distal 3.5 cm of the second metatarsal. Marrow edema in the distal 1.2 cm of the third metatarsal is also likely due to osteomyelitis. 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 Electronically Signed   By: Inge Rise M.D.   On: 01/02/2020 09:19   DG Foot Complete Right  Result Date: 01/01/2020 CLINICAL DATA:  Infection.  Lower extremity edema. EXAM: RIGHT FOOT COMPLETE - 3+ VIEW COMPARISON:  Most recent radiograph 04/13/2019 FINDINGS: Prior transmetatarsal amputation. Resection margins are smooth. There is no erosion or periosteal reaction. Small focal skin/soft tissue defect overlies the second metatarsal head amputation site. No radiopaque foreign body. Generalized soft tissue edema about the foot. Questionable soft tissue defect about the heel versus overlapping soft tissues. IMPRESSION: 1. Prior transmetatarsal amputation. No radiographic findings of osteomyelitis. 2. Small focal skin/soft tissue defect overlies  the second metatarsal head amputation site. Generalized subcutaneous soft tissue edema. 3. Questionable soft tissue defect about the heel versus overlapping soft tissues. Electronically Signed   By: Keith Rake M.D.   On: 01/01/2020 01:16   - pertinent xrays, CT, MRI studies were reviewed and independently interpreted  Positive ROS: All other systems have been  reviewed and were otherwise negative with the exception of those mentioned in the HPI and as above.  Physical Exam: General: Alert, no acute distress Psychiatric: Patient is competent for consent with normal mood and affect Lymphatic: No axillary or cervical lymphadenopathy Cardiovascular: No pedal edema Respiratory: No cyanosis, no use of accessory musculature GI: No organomegaly, abdomen is soft and non-tender    Images:  @ENCIMAGES @  Labs:  Lab Results  Component Value Date   HGBA1C 7.2 (A) 10/28/2019   HGBA1C 7.6 (A) 05/31/2019   HGBA1C 7.6 (H) 01/05/2019   ESRSEDRATE 54 (H) 01/01/2020   ESRSEDRATE 8 01/29/2016   ESRSEDRATE 9 04/11/2012   CRP 2.6 (H) 01/29/2016   CRP 0.5 02/28/2014   REPTSTATUS PENDING 12/31/2019   GRAMSTAIN  04/11/2012    NO WBC SEEN RARE SQUAMOUS EPITHELIAL CELLS PRESENT MODERATE GRAM POSITIVE COCCI IN PAIRS RARE GRAM NEGATIVE RODS   GRAMSTAIN  04/11/2012    NO WBC SEEN RARE SQUAMOUS EPITHELIAL CELLS PRESENT MODERATE GRAM POSITIVE COCCI IN PAIRS RARE GRAM NEGATIVE RODS   CULT  12/31/2019    NO GROWTH < 24 HOURS Performed at [facility], [address] [place], [place] [address]    LABORGA NO GROWTH 2 DAYS 07/19/2014    Lab Results  Component Value Date   ALBUMIN 3.1 (L) 12/31/2019   ALBUMIN 3.5 10/18/2018   ALBUMIN 3.5 07/12/2018    Neurologic: Patient does not have protective sensation bilateral lower extremities.   MUSCULOSKELETAL:   Skin: Examination patient has cellulitis and massive swelling of the right lower extremity the calf and foot are tender to  palpation.  Patient has an ulcer over the distal aspect of the transmetatarsal amputation this was debrided at bedside there was a large purulent abscess that was decompressed the second metatarsal it is palpable.  I cannot palpate pulses secondary to the significant swelling.  Review of the radiographs shows no destructive bony changes review of the MRI scan shows the abscess and shows the osteomyelitis of the exposed second metatarsal.  Assessment: Assessment: Diabetic insensate neuropathy with abscess and osteomyelitis right foot with a transmetatarsal amputation.  Plan: I discussed with the patient and his wife that he does not have sufficient soft tissue that is not involved with the infection to proceed with foot salvage intervention.  Recommended that patient proceed with a transtibial amputation on Wednesday.  Patient will need the IV antibiotics prior to surgery to try to decrease the cellulitis in the leg.  The abscess has been decompressed this should help with decompressing the cellulitis.  Continue IV antibiotics until surgery.  Thank you for the consult and the opportunity to see Mr. Paris, MD [facility] [phone] 10:43 AM

## 2020-01-02 NOTE — Progress Notes (Addendum)
Family Medicine Teaching Service Daily Progress Note Intern Pager: [phone]  Patient name: BRISTON LAX Medical record number: [id number] Date of birth: 05/24/1956 Age: 64 y.o. Gender: male  Primary Care Provider: [name], MD Consultants: Pharmacy  Code Status: Full Code   Pt Overview and Major Events to Date:  Hospital Day: 3 12/31/2019: admitted for Leg Swelling  Assessment and Plan: ASAPH SERENA is a 64 y.o. male who presented w/ lower extremity edema and severe cellulitis.  PMHx s/f hypertension, CAD, PAD, HFpEF, insulin-dependent diabetes, CKD 2, OA, history of alcohol abuse.  Extensive cellulitis with concern for possible abscess formation and osteomyelitis of right LE: Acute, worsening. Cellulitis appears to remain within borders marked yesterday, not worsening.  Patient is on day 2 of vancomycin and Zosyn.  He does not have a leukocytosis and his blood cultures are negative to date.  MRI was performed overnight, but not yet read. For LEE, on lasix BID at home. Will decrease to once daily.   Follow-up on MRI read  Continue antibiotics  Will reach out to [facility] about current treatment   Order arterial dopplers   Elevate lower extremities   Venous stasis  3+ pitting in B/L LE. At home, takes 60 mg lasix BID. Patient reports no recent improvement. Will decrease lasix to once daily and elevated lower extremities.   HTN CAD PAD HFpEF Most recent echo in January 2020 shows ejection fraction 65 to 70% with grade 1 diastolic dysfunction.  Mild mitral regurg.  Blood pressures overall stable overnight.  Last blood pressure 101/67.  At home, on lasix and lisinopril, DAPT (ASA + brillianta), rosuvastatin.  K 3.3 this morning. Will replete.   Continue home medications  IDDM II  At home, on lantus 80 units BID + 40 units BID, jardiance 12.5 daily.  CBGs in last 24 hours ranging from 67-190.  Will decrease home Lantus to 60 units twice daily given low fasting this  morning.  Continue home insulin dosing + sSSI   Holding jardiance   CBG QID   Hx of ETOH abuse  Continue CIWA   CKD II  Creatinine 1.41 this morning from 1.25 yesterday.  Mild proteinuria. Will decrease lasix to once daily   Continue to monitor in setting of lasix and abx   OA On Relafen at home, 500 mg daily.  Held on admission   Muscular pain  Takes baclofen daily at home.   Continue baclofen   Fungal infection ? Per med rec, patient takes nystatin 100 mg daily. Appears last addressed in January 2021. Will hold this medication at this time and continue to monitor via physical exam daily   FEN/GI:   Fluids: KVO   Electrolytes: replete PRN, K 3.3    Nutrition: HH/CM    Access: R PIV, L PIV  VTE prophylaxis: lovenox   Disposition: When medically stable  TBD -     Subjective:  NAEO.   Objective: Temp:  [99 F (37.2 C)-101.1 F (38.4 C)] 99 F (37.2 C) (07/04 0351) Pulse Rate:  [67-76] 67 (07/04 0351) Cardiac Rhythm: Normal sinus rhythm (07/03 1925) Resp:  [18-22] 18 (07/04 0351) BP: (101-145)/(65-74) 101/67 (07/04 0351) SpO2:  [95 %-100 %] 95 % (07/04 0351) Weight:  [84.2 kg] 84.2 kg (07/04 0400) Intake/Output      07/03 0701 - 07/04 0700 07/04 0701 - 07/05 0700   P.O. 360    IV Piggyback 250    Total Intake(mL/kg) 610 (7.2)    Urine (mL/kg/hr)  850 (0.4)    Total Output 850    Net -240         Urine Occurrence 1 x        Physical Exam: General: NAD, non-toxic, well-appearing, sitting comfortably in bed eating breakfast    HEENT: [place]/AT. PERRLA. EOMI.  Cardiovascular: RRR, normal S1, S2. B/L 2+ RP. 3+ BLEE Respiratory: CTAB. No IWOB.  Abdomen: + BS. NT, ND, soft to palpation.  Extremities: RLE (see picture). 3+ pitting on left lower extremity.  Integumentary: No obvious rashes, lesions, trauma on general exam. Neuro: A & O x4. CN grossly intact. No FND     Laboratory: I have personally read and reviewed all labs and imaging studies.   CBC: Recent Labs  Lab 01/07/20 2052 01/01/20 0432 01/02/20 0405  WBC 6.5 7.0 5.4  NEUTROABS 5.4  --  3.2  HGB 12.8* 11.7* 12.6*  HCT 40.0 36.2* 37.9*  MCV 91.1 92.3 88.8  PLT 143* 126* 154   CMP: Recent Labs  Lab 2020/01/07 2052 01/01/20 0432 01/02/20 0405  NA 134* 135 134*  K 4.0 3.8 3.3*  CL 97* 101 96*  CO2 26 23 25   GLUCOSE 223* 206* 89  BUN 12 11 14   CREATININE 1.38* 1.25* 1.41*  CALCIUM 8.8* 8.3* 8.7*  ALBUMIN 3.1*  --   --    CBG: Recent Labs  Lab 01/01/20 1113 01/01/20 1708 01/01/20 2027 01/02/20 0620 01/02/20 0645  GLUCAP 190* 190* 173* 67* 72   Micro: Covid Negative  Recent Results (from the past 240 hour(s))  Blood culture (routine x 2)     Status: None (Preliminary result)   Collection Time: 07-Jan-2020  8:30 PM   Specimen: BLOOD  Result Value Ref Range Status   Specimen Description BLOOD LEFT ARM  Final   Special Requests   Final    BOTTLES DRAWN AEROBIC AND ANAEROBIC Blood Culture adequate volume   Culture   Final    NO GROWTH < 24 HOURS Performed at [name], [address] [place], [place] [address]    Report Status PENDING  Incomplete  Blood culture (routine x 2)     Status: None (Preliminary result)   Collection Time: Jan 07, 2020  8:58 PM   Specimen: BLOOD  Result Value Ref Range Status   Specimen Description BLOOD RIGHT ARM  Final   Special Requests   Final    BOTTLES DRAWN AEROBIC ONLY Blood Culture results may not be optimal due to an excessive volume of blood received in culture bottles   Culture   Final    NO GROWTH < 24 HOURS Performed at [facility], [place] [address], [place], [place] [address]    Report Status PENDING  Incomplete  SARS Coronavirus 2 by RT PCR (hospital order, performed in [facility] hospital lab) Nasopharyngeal Nasopharyngeal Swab     Status: None   Collection Time: 01/01/20  1:33 AM   Specimen: Nasopharyngeal Swab  Result Value Ref Range Status   SARS Coronavirus 2 NEGATIVE NEGATIVE Final     Comment: (NOTE) SARS-CoV-2 target nucleic acids are NOT DETECTED.  The SARS-CoV-2 RNA is generally detectable in upper and lower respiratory specimens during the acute phase of infection. The lowest concentration of SARS-CoV-2 viral copies this assay can detect is 250 copies / mL. A negative result does not preclude SARS-CoV-2 infection and should not be used as the sole basis for treatment or other patient management decisions.  A negative result may occur with improper  specimen collection / handling, submission of specimen other than nasopharyngeal swab, presence of viral mutation(s) within the areas targeted by this assay, and inadequate number of viral copies (<250 copies / mL). A negative result must be combined with clinical observations, patient history, and epidemiological information.  Fact Sheet for Patients:   StrictlyIdeas.no  Fact Sheet for Healthcare Providers: BankingDealers.co.za  This test is not yet approved or  cleared by the [place] FDA and has been authorized for detection and/or diagnosis of SARS-CoV-2 by FDA under an Emergency Use Authorization (EUA).  This EUA will remain in effect (meaning this test can be used) for the duration of the COVID-19 declaration under Section 564(b)(1) of the Act, 21 U.S.C. section 360bbb-3(b)(1), unless the authorization is terminated or revoked sooner.  Performed at [facility], [place] [address] [place], [place] [address]      Imaging/Diagnostic Tests: DG Foot Complete Right  Result Date: 01/01/2020 CLINICAL DATA:  Infection.  Lower extremity edema. EXAM: RIGHT FOOT COMPLETE - 3+ VIEW COMPARISON:  Most recent radiograph 04/13/2019 FINDINGS: Prior transmetatarsal amputation. Resection margins are smooth. There is no erosion or periosteal reaction. Small focal skin/soft tissue defect overlies the second metatarsal head amputation site. No radiopaque foreign body.  Generalized soft tissue edema about the foot. Questionable soft tissue defect about the heel versus overlapping soft tissues. IMPRESSION: 1. Prior transmetatarsal amputation. No radiographic findings of osteomyelitis. 2. Small focal skin/soft tissue defect overlies the second metatarsal head amputation site. Generalized subcutaneous soft tissue edema. 3. Questionable soft tissue defect about the heel versus overlapping soft tissues. Electronically Signed   By: Keith Rake M.D.   On: 01/01/2020 01:16    EKG Interpretation  Date/Time:  Saturday January 01 2020 01:01:56 EDT Ventricular Rate:  70 PR Interval:    QRS Duration: 105 QT Interval:  393 QTC Calculation: 424 R Axis:   -9 Text Interpretation: Sinus rhythm Ventricular trigeminy Low voltage, precordial leads Probable anteroseptal infarct, old Confirmed by Ward, Cyril Mourning [phone]) on 01/01/2020 1:04:21 AM        Procedures:   Procedure Orders     Critical Care     ED EKG 12-Lead     EKG 12-Lead  Wilber Oliphant, MD 01/02/2020, 7:02 AM PGY-3, [place] Intern pager: [phone], text pages welcome

## 2020-01-03 LAB — GLUCOSE, CAPILLARY
Glucose-Capillary: 47 mg/dL — ABNORMAL LOW (ref 70–99)
Glucose-Capillary: 51 mg/dL — ABNORMAL LOW (ref 70–99)
Glucose-Capillary: 79 mg/dL (ref 70–99)
Glucose-Capillary: 224 mg/dL — ABNORMAL HIGH (ref 70–99)
Glucose-Capillary: 194 mg/dL — ABNORMAL HIGH (ref 70–99)
Glucose-Capillary: 227 mg/dL — ABNORMAL HIGH (ref 70–99)
Glucose-Capillary: 150 mg/dL — ABNORMAL HIGH (ref 70–99)

## 2020-01-03 LAB — BASIC METABOLIC PANEL
Anion gap: 11 (ref 5–15)
BUN: 20 mg/dL (ref 8–23)
CO2: 27 mmol/L (ref 22–32)
Calcium: 8.6 mg/dL — ABNORMAL LOW (ref 8.9–10.3)
Chloride: 95 mmol/L — ABNORMAL LOW (ref 98–111)
Creatinine, Ser: 1.38 mg/dL — ABNORMAL HIGH (ref 0.61–1.24)
GFR calc Af Amer: >60 mL/min (ref >60)
GFR calc non Af Amer: 54 mL/min — ABNORMAL LOW (ref >60)
Glucose, Bld: 225 mg/dL — ABNORMAL HIGH (ref 70–99)
Potassium: 4.1 mmol/L (ref 3.5–5.1)
Sodium: 133 mmol/L — ABNORMAL LOW (ref 135–145)

## 2020-01-03 MED ORDER — DEXTROSE 50 % IV SOLN
25.0000 g | INTRAVENOUS | Status: DC
  Filled 2020-01-03: qty 50

## 2020-01-03 MED ORDER — INSULIN ASPART 100 UNIT/ML ~~LOC~~ SOLN
30.0000 [IU] | Freq: Two times a day (BID) | SUBCUTANEOUS | Status: DC
  Administered 2020-01-03: 30 [IU] via SUBCUTANEOUS

## 2020-01-03 MED ORDER — INSULIN GLARGINE 100 UNIT/ML ~~LOC~~ SOLN
40.0000 [IU] | Freq: Two times a day (BID) | SUBCUTANEOUS | Status: DC
  Administered 2020-01-03: 40 [IU] via SUBCUTANEOUS
  Filled 2020-01-03 (×3): qty 0.4

## 2020-01-03 NOTE — Hospital Course (Addendum)
Adam Ray is a 64 y.o. male who presented w/ lower extremity edema and severe cellulitis.  PMHx s/f hypertension, CAD, PAD, HFpEF, insulin-dependent diabetes, CKD 2, OA, history of alcohol abuse.  When admitted patient had extreme cellulitis of the RLE but did not meet sepsis criteria. X- ray of the RLE was not conclusive for osteomyelitis; subsequent MRI was conclusive of osteomyelitis. Patient was seen by surgery who concluded that the patient would require transtibial amputation of the R LE and recommended Vancomycin and Zosyn for osteomyelitis and prevention of bacteremia. Patient had a home regimen for Lasix which was initially continued due to concern for bilateral lower extremity edema. However, lower extremity edema was also complicated by a history of venous stasis.  Patient Cr increased and patient did not appear to be hypervolemic, Lasix was discontinued and patient's Cr levels followed with a downward trend. Patient's bilateral lower extremity edema was likely secondary to his venous stasis and treated with elevation. Patient completed 5 days vancomycin and 6 days of zosyn. The abx regimen was stopped 24 hours after patient's successful transtibial amputation. Patient required muscle relaxants for post- op cramping at the amputation site and post-op pain medications. Post op patient was recommended to continue wound vac for 1 week and recommended rehab. PT/OT recommended CIR, which patient was aggreable to.   Patient was initially placed on home regimen for diabetes management withholding his Jardiance and using LAI, SAI, and slideing scale. However patient continued to be hypoglycemic in the morning and the regimen was adjusted to account for morning hypoglycemia. Patient remained stable with 60 U daily of Lantus.   Follow up recommendations: Follow up ortho in 1 week post surgery

## 2020-01-03 NOTE — Progress Notes (Signed)
Family Medicine Teaching Service Daily Progress Note Intern Pager: [phone]  Patient name: Adam Ray Medical record number: [id number] Date of birth: [birth year] Age: 64 y.o. Gender: male  Primary Care Provider: [name], MD Consultants: Orthopaedics, Pharmacy Code Status: FULL  Pt Overview and Major Events to Date:  Hospital Day: 4 12/31/2019: admitted for Leg Swelling 01/02/2020: Seen by Ortho, recommend transtibial amputation  Assessment and Plan: Adam Ray is a 64 y.o. male who presented w/ lower extremity edema and severe cellulitis.  PMHx s/f hypertension, CAD, PAD, HFpEF, insulin-dependent diabetes, CKD 2, OA, history of alcohol abuse.  Extensive cellulitis with concern for possible abscess formation and osteomyelitis of right DE:YCXKG Cellulitis appears to remain within borders marked at admission, not worsening.  Patient is on day 3 of vancomycin and Zosyn.  He does not have a leukocytosis and his blood cultures are negative to date.  MRI was performed and concluded osteomyelitis in the distal 3.5cm of the second metatarsal with subsequent marrow edema in the distal 1.2 cm of the third metatarsal. Patient currently on Lasix 1x/ day and does not notice a change in anasarca, we will continue to monitor urine output and renal function.   Continue antibiotics  Ortho consult, appreciate recommendations  Per ortho, patient will require transtibial amputation scheduled 01/05/2020 and should cont. abx IV   arterial dopplers, pending  Continue to elevate lower extremities   Venous stasis  3+ pitting in B/L LE. At home, takes 60 mg lasix BID. Patient reports no recent improvement. Will continue lasix at once daily and elevate lower extremities.   HTN CAD PAD HFpEF Most recent echo in January 2020 shows ejection fraction 65 to 70% with grade 1 diastolic dysfunction.  Mild mitral regurg.  Blood pressures overall stable overnight.  Last blood pressure 114/78.  At  home, on lasix and lisinopril, DAPT (ASA + brillianta), rosuvastatin.     Continue home medications  IDDM II  At home, on lantus 80 units BID + 40 units BID, jardiance 12.5 daily. Patient had second episode of morning hypoglycemia, today on 60U BID Lantus + 40 novolog.  CBGs in last 24 hours ranging from 79-224.  Will adjust Lantus dosing and SS1 as below. Patient's ongoing morning hypoglycemia despite being welll controlled outpatient on his home regimen is likely secondary to change in diet. It is likely that the patient will  need a readjustment in medication regimen after surgery.  We will decrease Lantus dose to 40 U BID + 30 U Novolog SS   Holding jardiance   CBG QID   Hx of ETOH abuse  Continue CIWA   CKD II  Creatinine trended upward yesterday resulting in decreasing Lasix to 1x day.   Mild proteinuria noted 7/4. Need a Cr today to continue to trend Cr and kidney response to Abx + Lasix.  Will continue lasix once daily   - BMP   Continue to monitor in setting of lasix and abx   OA On Relafen at home, 500 mg daily.  Held on admission, suggest revaluating this at f/u at this is not standard care for OA and patient has CKDII.   Muscular pain  Takes baclofen daily at home.   Continue baclofen   Fungal infection ? Per med rec, patient takes nystatin 100 mg daily. Appears last addressed in January 2021. Will hold this medication at this time and continue to monitor via physical exam daily    FEN/GI: Heart healthy/ carb modified PPx: Lovenox  Disposition: Inpatient  Subjective:  Patient CBG AM 47 otherwise patient had an uneventful night. Hypoglycemia protocol was followed and patient was f/u CBG was 79 without D50. Patient did not have any additional concerns at this time.  Objective: Temp:  [97.3 F (36.3 C)-98.3 F (36.8 C)] 97.3 F (36.3 C) (07/05 0405) Pulse Rate:  [58-74] 58 (07/05 0405) Resp:  [17-20] 18 (07/05 0405) BP: (110-120)/(67-78) 114/78 (07/05  0405) SpO2:  [98 %-99 %] 98 % (07/05 0405) Weight:  [82.8 kg] 82.8 kg (07/05 0406) Physical Exam: General: Patient resting comfortably in bed Cardiovascular: Regular rate and rhythm no murmur auscultated Respiratory: Clear to auscultation bilaterally, no increased work of breathing observed Abdomen: No pain to palpation, Normoactive bowel sounds Extremities: Bilateral 3+ pitting edema of the lower extremities. Area of erythema does not pass the boundary marked at admission on the RLE, patient has pain to palpation of the R lower extremity  Laboratory: Recent Labs  Lab 12/31/19 2052 01/01/20 0432 01/02/20 0405  WBC 6.5 7.0 5.4  HGB 12.8* 11.7* 12.6*  HCT 40.0 36.2* 37.9*  PLT 143* 126* 154   Recent Labs  Lab 12/31/19 2052 01/01/20 0432 01/02/20 0405  NA 134* 135 134*  K 4.0 3.8 3.3*  CL 97* 101 96*  CO2 26 23 25   BUN 12 11 14   CREATININE 1.38* 1.25* 1.41*  CALCIUM 8.8* 8.3* 8.7*  PROT 6.5  --   --   BILITOT 0.9  --   --   ALKPHOS 106  --   --   ALT 31  --   --   AST 30  --   --   GLUCOSE 223* 206* 89      Imaging/Diagnostic Tests: 01/02/2020- MRI R foot wo contrast IMPRESSION: Skin ulceration on the patient's stump at the second metatarsal with an underlying abscess extending dorsal to the second metatarsal as described.  Osteomyelitis in the distal 3.5 cm of the second metatarsal. Marrow edema in the distal 1.2 cm of the third metatarsal is also likely due to osteomyelitis.  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  Freida Busman, MD 01/03/2020, 5:42 AM PGY-1, [place] Intern pager: [phone], text pages welcome

## 2020-01-03 NOTE — Progress Notes (Addendum)
Inpatient Diabetes Program Recommendations  AACE/ADA: New Consensus Statement on Inpatient Glycemic Control (2015)  Target Ranges:  Prepandial:   less than 140 mg/dL      Peak postprandial:   less than 180 mg/dL (1-2 hours)      Critically ill patients:  140 - 180 mg/dL   Results for FISHER, HARGADON (MRN [id number]) as of 01/03/2020 10:28  Ref. Range 01/02/2020 06:20 01/02/2020 06:45 01/02/2020 08:13 01/02/2020 12:03 01/02/2020 16:21 01/02/2020 21:43  Glucose-Capillary Latest Ref Range: 70 - 99 mg/dL 67 (L) 72 124 (H)  40 units NOVOLOG  136 (H)  1 unit NOVOLOG +  60 units LANTUS given at 10am  157 (H)  42 units NOVOLOG  132 (H)     60 units LANTUS given at 11pm   Results for JEYDEN, COFFELT (MRN [id number]) as of 01/03/2020 10:28  Ref. Range 01/03/2020 06:12 01/03/2020 06:54 01/03/2020 07:28 01/03/2020 09:32  Glucose-Capillary Latest Ref Range: 70 - 99 mg/dL 47 (L) 51 (L) 79 224 (H)    Admit with: Extensive cellulitis with concern for possible abscess formation and osteomyelitis of right KG:YJEHU, worsening  History: DM, CHF, CKD  Home DM Meds: Lantus 80 units BID       Novolog 40 units BID       Jardiance 12.5 mg Daily  Current Orders: Lantus 60 units BID      Novolog Sensitive Correction Scale/ SSI (0-9 units) TID AC      Novolog 40 units BID with meals    MD- Patient with Severe Hypoglycemia this AM after receiving a total of 120 units Lantus yesterday (07/04).  Please consider reducing Lantus to 50 units BID     --Will follow patient during hospitalization--  Wyn Quaker RN, MSN, CDE Diabetes Coordinator Inpatient Glycemic Control Team Team Pager: [phone] (8a-5p)

## 2020-01-03 NOTE — Progress Notes (Signed)
Patient refused follow-up CBG on the grounds that "it hasn't been 15 minutes yet". Will wait another ten minutes and reattempt.

## 2020-01-03 NOTE — Progress Notes (Addendum)
Hypoglycemic Event  CBG: 47  Treatment: juice and graham crackers  Symptoms: none  Follow-up CBG: Time: 0654 CBG Result: 51  Possible Reasons for Event: ongoing Lantus adjustment  Comments/MD notified: none    Lanier Ensign

## 2020-01-03 NOTE — Progress Notes (Signed)
Rechecked CBG before pushing D50 per patient request. Latest CBG 79. As hypoglycemia protocol criteria is no longer met, D50 not given.

## 2020-01-04 ENCOUNTER — Other Ambulatory Visit: Payer: Self-pay | Admitting: Physician Assistant

## 2020-01-04 DIAGNOSIS — E1142 Type 2 diabetes mellitus with diabetic polyneuropathy: Secondary | ICD-10-CM

## 2020-01-04 DIAGNOSIS — Z794 Long term (current) use of insulin: Secondary | ICD-10-CM

## 2020-01-04 LAB — CBC
HCT: 40.3 % (ref 39.0–52.0)
Hemoglobin: 13.2 g/dL (ref 13.0–17.0)
MCH: 29.2 pg (ref 26.0–34.0)
MCHC: 32.8 g/dL (ref 30.0–36.0)
MCV: 89.2 fL (ref 80.0–100.0)
Platelets: 165 10*3/uL (ref 150–400)
RBC: 4.52 MIL/uL (ref 4.22–5.81)
RDW: 12.6 % (ref 11.5–15.5)
WBC: 4.8 10*3/uL (ref 4.0–10.5)
nRBC: 0 % (ref 0.0–0.2)

## 2020-01-04 LAB — BASIC METABOLIC PANEL
Anion gap: 11 (ref 5–15)
BUN: 24 mg/dL — ABNORMAL HIGH (ref 8–23)
CO2: 28 mmol/L (ref 22–32)
Calcium: 8.7 mg/dL — ABNORMAL LOW (ref 8.9–10.3)
Chloride: 97 mmol/L — ABNORMAL LOW (ref 98–111)
Creatinine, Ser: 1.41 mg/dL — ABNORMAL HIGH (ref 0.61–1.24)
GFR calc Af Amer: >60 mL/min (ref >60)
GFR calc non Af Amer: 52 mL/min — ABNORMAL LOW (ref >60)
Glucose, Bld: 74 mg/dL (ref 70–99)
Potassium: 3.8 mmol/L (ref 3.5–5.1)
Sodium: 136 mmol/L (ref 135–145)

## 2020-01-04 LAB — GLUCOSE, CAPILLARY
Glucose-Capillary: 58 mg/dL — ABNORMAL LOW (ref 70–99)
Glucose-Capillary: 113 mg/dL — ABNORMAL HIGH (ref 70–99)
Glucose-Capillary: 91 mg/dL (ref 70–99)
Glucose-Capillary: 138 mg/dL — ABNORMAL HIGH (ref 70–99)
Glucose-Capillary: 177 mg/dL — ABNORMAL HIGH (ref 70–99)

## 2020-01-04 MED ORDER — CEFAZOLIN SODIUM-DEXTROSE 2-4 GM/100ML-% IV SOLN
2.0000 g | INTRAVENOUS | Status: DC
  Filled 2020-01-04: qty 100

## 2020-01-04 MED ORDER — POLYETHYLENE GLYCOL 3350 17 G PO PACK
17.0000 g | PACK | Freq: Every day | ORAL | Status: DC
  Administered 2020-01-04 – 2020-01-09 (×5): 17 g via ORAL
  Filled 2020-01-04 (×8): qty 1

## 2020-01-04 MED ORDER — INSULIN GLARGINE 100 UNIT/ML ~~LOC~~ SOLN
60.0000 [IU] | Freq: Every day | SUBCUTANEOUS | Status: DC
  Administered 2020-01-04 – 2020-01-11 (×7): 60 [IU] via SUBCUTANEOUS
  Filled 2020-01-04 (×9): qty 0.6

## 2020-01-04 MED ORDER — SENNA 8.6 MG PO TABS
1.0000 | ORAL_TABLET | Freq: Every day | ORAL | Status: DC
  Administered 2020-01-04 – 2020-01-15 (×7): 8.6 mg via ORAL
  Filled 2020-01-04 (×10): qty 1

## 2020-01-04 MED ORDER — FUROSEMIDE 40 MG PO TABS: 60.0000 mg | ORAL_TABLET | Freq: Every day | ORAL | Status: DC

## 2020-01-04 NOTE — Progress Notes (Signed)
Family Medicine Teaching Service Daily Progress Note Intern Pager: [phone]  Patient name: CAEL WORTH Medical record number: [id number] Date of birth: 1955/12/22 Age: 64 y.o. Gender: male  Primary Care Provider: [name], MD Consultants: Orthopaedics and Pharmacy Code Status: FULL  Pt Overview and Major Events to Date:  Hospital Day: 5 12/31/2019: admitted forLeg Swelling 01/02/2020: Seen by Ortho, recommend transtibial amputation  Assessment and Plan: Quantavious Eggert Foustis a 64 y.o.malewho presented w/ lower extremity edema and severe cellulitis.  PMHx s/fhypertension, CAD, PAD, HFpEF, insulin-dependent diabetes, CKD 2, OA, history of alcohol abuse.  Extensive cellulitis with concern for possible abscess formation and osteomyelitis of right QM:VHQIO Cellulitis appears to remain within borders marked at admission, not worsening.Patient is on day 4 of vancomycin and Zosyn. He does not have a leukocytosis and his blood cultures are negative to date. MRI was performed and concluded osteomyelitis in the distal 3.5cm of the second metatarsal with subsequent marrow edema in the distal 1.2 cm of the third metatarsal. Patient did have a slight bump in Cr 1.38> 1.41, we will continue to monitor. BUN/Cr has increased and is > 20:1. We will stop Lasix.  Continue antibiotics  Ortho consult, appreciate recommendations  Per ortho, patient will require transtibial amputation scheduled 01/05/2020 and should cont. abx IV   arterial dopplers, pending  Continue to elevate lower extremities  Holding Lovenox for surgery   Venous stasis  1+ pitting in B/L LE. At home, takes 60 mg lasix BID. Patient reports no recent improvement.  Will stop Lasix due to elevated BUN/Cr ratio and elevated Cr.  Continue to elevate lower extremities.  HTN CAD PAD HFpEF Most recent echo in January 2020 shows ejection fraction 65 to 70% with grade 1 diastolic dysfunction. Mild mitral regurg. Blood  pressures overall stable overnight. At home, on lasix and lisinopril, DAPT (ASA + brillianta), rosuvastatin.    Holding ASA, and Brillanta for surgery  Continue home rousuvastatin  IDDM II  At home, on lantus 80 units BID + 40 units BID, jardiance 12.5 daily. Patient had third episode of morning hypoglycemia, today on 60U BID Lantus + 40 novolog.CBGs this morning before breakfast 59.Will adjust Lantus dosing and SS1 as below. Patient's ongoing morning hypoglycemia despite being welll controlled outpatient on his home regimen is likely secondary to change in diet. It is likely that the patient will  need a readjustment in medication regimen after surgery.  We will decrease Lantus dose to 30 U BID + 30 U Novolog SS   Holding jardiance   CBG QID   Hx of ETOH abuse  Continue CIWA   CKD II  Mild proteinuria noted 7/4. Cr trended upward.  Will stop lasix.  - BMP  Continue to monitor in setting of abx   OA On Relafen at home, 500 mg daily.  Held on admission, suggest revaluating this at f/u at this is not standard care for OA and patient has CKDII.   Muscular pain  Takes baclofen daily at home.   Continue baclofen  Fungal infection ? Per med rec, patient takes nystatin 100 mg daily. Appears last addressed in January 2021. Will hold this medication at this time and continue to monitor via physical exam daily     FEN/GI: Heart healthy/ carb modified, NPO at midnight PPx: Held for surgery  Disposition: Inpatient  Subjective:  Patient had no overnight events. He continues to have morning episodes of hypoglycemia. Patient has not had a BM in 3 weeks, he does not  seemed concerned.  Objective: Temp:  [98.1 F (36.7 C)-98.3 F (36.8 C)] 98.1 F (36.7 C) (07/06 0550) Pulse Rate:  [63-72] 70 (07/06 0550) Resp:  [20] 20 (07/06 0550) BP: (107-124)/(63-77) 109/77 (07/06 0550) SpO2:  [97 %] 97 % (07/06 0550) Weight:  [85.5 kg] 85.5 kg (07/06 0550) Physical  Exam: General: Patient resting in bed arguing with his wife on the phone.  Cardiovascular: Regular rate and rhythm, no murmur auscl Respiratory: Clear and equal to bil. Auscultation., no extra work of breathing noted Abdomen: No pain to palpation, normoactive bowel sounds Extremities: RLE erythematous does not extend pass the line of demarcation, pain to palpation of the lower extremities, 1+ pitting edema to the shin bilaterally  Laboratory: Recent Labs  Lab 01/01/20 0432 01/02/20 0405 01/04/20 0435  WBC 7.0 5.4 4.8  HGB 11.7* 12.6* 13.2  HCT 36.2* 37.9* 40.3  PLT 126* 154 165   Recent Labs  Lab 12/31/19 2052 01/01/20 0432 01/02/20 0405 01/03/20 1005 01/04/20 0435  NA 134*   < > 134* 133* 136  K 4.0   < > 3.3* 4.1 3.8  CL 97*   < > 96* 95* 97*  CO2 26   < > 25 27 28   BUN 12   < > 14 20 24*  CREATININE 1.38*   < > 1.41* 1.38* 1.41*  CALCIUM 8.8*   < > 8.7* 8.6* 8.7*  PROT 6.5  --   --   --   --   BILITOT 0.9  --   --   --   --   ALKPHOS 106  --   --   --   --   ALT 31  --   --   --   --   AST 30  --   --   --   --   GLUCOSE 223*   < > 89 225* 74   < > = values in this interval not displayed.      Imaging/Diagnostic Tests: None new  Freida Busman, MD 01/04/2020, 6:27 AM PGY-1, [place] Intern pager: [phone], text pages welcome

## 2020-01-04 NOTE — Progress Notes (Signed)
Patient's am blood sugar was 58. He remained alert & oriented x 4. Was given OJ and rechecked. Was 113.

## 2020-01-04 NOTE — Plan of Care (Signed)
  Problem: Activity: Goal: Risk for activity intolerance will decrease Outcome: Progressing   Problem: Elimination: Goal: Will not experience complications related to urinary retention Outcome: Progressing   

## 2020-01-04 NOTE — Progress Notes (Signed)
Inpatient Diabetes Program Recommendations  AACE/ADA: New Consensus Statement on Inpatient Glycemic Control (2015)  Target Ranges:  Prepandial:   less than 140 mg/dL      Peak postprandial:   less than 180 mg/dL (1-2 hours)      Critically ill patients:  140 - 180 mg/dL   Lab Results  Component Value Date   GLUCAP 91 01/04/2020   HGBA1C 7.2 (A) 10/28/2019    Review of Glycemic Control Results for MAKAIL, WATLING (MRN [id number]) as of 01/04/2020 12:42  Ref. Range 01/03/2020 16:23 01/03/2020 21:07 01/04/2020 05:46 01/04/2020 06:32 01/04/2020 11:27  Glucose-Capillary Latest Ref Range: 70 - 99 mg/dL 227 (H) 150 (H) 58 (L) 113 (H) 91   Diabetes history: DM 2 Home DM Meds: Lantus 80 units BID                             Novolog 40 units BID                             Jardiance 12.5 mg Daily Current Orders: Lantus 60 units once daily (reduced today)                            Novolog Sensitive Correction Scale/ SSI (0-9 units) TID AC                            Novolog 30 units BID with meals  Inpatient Diabetes Program Recommendations:   Consider also reducing Novolog meal coverage to 10 units tid with meals.    Thanks  Adah Perl, RN, BC-ADM Inpatient Diabetes Coordinator Pager [phone] (8a-5p)

## 2020-01-04 NOTE — Consult Note (Signed)
WOC Nurse Consult Note: Patient receiving care in [facility] 3E10.  Consult completed remotely after review of record.  Dr. Sharol Given to perform amputation surgery to RLE tomorrow. Reason for Consult: Right foot to have surgery tomorrow Wound type: necrotic; managed by Dr. Sharol Given. Pressure Injury POA: Yes/No/NA Measurement: Wound bed: Drainage (amount, consistency, odor)  Periwound: Dressing procedure/placement/frequency: Wrap R foot in kerlex, change daily and prn. Val Riles, RN, MSN, CWOCN, CNS-BC, pager [phone]

## 2020-01-04 NOTE — Progress Notes (Signed)
Patient and wife had a verbal altercation. He asked her to leave and not come back. She replied she will not be back to visit. She also told patient not to push her. Patient return to bed without further distress. Will continue to monitor for safety.

## 2020-01-05 ENCOUNTER — Encounter (HOSPITAL_COMMUNITY): Payer: Self-pay | Admitting: Internal Medicine

## 2020-01-05 ENCOUNTER — Encounter (HOSPITAL_COMMUNITY): Payer: No Typology Code available for payment source

## 2020-01-05 ENCOUNTER — Inpatient Hospital Stay (HOSPITAL_COMMUNITY): Payer: No Typology Code available for payment source | Admitting: Certified Registered Nurse Anesthetist

## 2020-01-05 ENCOUNTER — Encounter (HOSPITAL_COMMUNITY)
Admission: EM | Disposition: A | Discharge status: Home Health | Payer: Self-pay | Source: Home / Self Care | Attending: Family Medicine

## 2020-01-05 DIAGNOSIS — M86271 Subacute osteomyelitis, right ankle and foot: Secondary | ICD-10-CM

## 2020-01-05 DIAGNOSIS — L02611 Cutaneous abscess of right foot: Secondary | ICD-10-CM

## 2020-01-05 HISTORY — PX: AMPUTATION: SHX166

## 2020-01-05 LAB — BASIC METABOLIC PANEL
Anion gap: 9 (ref 5–15)
BUN: 25 mg/dL — ABNORMAL HIGH (ref 8–23)
CO2: 25 mmol/L (ref 22–32)
Calcium: 8.6 mg/dL — ABNORMAL LOW (ref 8.9–10.3)
Chloride: 98 mmol/L (ref 98–111)
Creatinine, Ser: 1.29 mg/dL — ABNORMAL HIGH (ref 0.61–1.24)
GFR calc Af Amer: >60 mL/min (ref >60)
GFR calc non Af Amer: 58 mL/min — ABNORMAL LOW (ref >60)
Glucose, Bld: 247 mg/dL — ABNORMAL HIGH (ref 70–99)
Potassium: 4.3 mmol/L (ref 3.5–5.1)
Sodium: 132 mmol/L — ABNORMAL LOW (ref 135–145)

## 2020-01-05 LAB — CBC
HCT: 37.9 % — ABNORMAL LOW (ref 39.0–52.0)
Hemoglobin: 12.3 g/dL — ABNORMAL LOW (ref 13.0–17.0)
MCH: 29.4 pg (ref 26.0–34.0)
MCHC: 32.5 g/dL (ref 30.0–36.0)
MCV: 90.5 fL (ref 80.0–100.0)
Platelets: 163 10*3/uL (ref 150–400)
RBC: 4.19 MIL/uL — ABNORMAL LOW (ref 4.22–5.81)
RDW: 12.6 % (ref 11.5–15.5)
WBC: 4.5 10*3/uL (ref 4.0–10.5)
nRBC: 0 % (ref 0.0–0.2)

## 2020-01-05 LAB — GLUCOSE, CAPILLARY
Glucose-Capillary: 140 mg/dL — ABNORMAL HIGH (ref 70–99)
Glucose-Capillary: 108 mg/dL — ABNORMAL HIGH (ref 70–99)
Glucose-Capillary: 105 mg/dL — ABNORMAL HIGH (ref 70–99)
Glucose-Capillary: 91 mg/dL (ref 70–99)
Glucose-Capillary: 141 mg/dL — ABNORMAL HIGH (ref 70–99)
Glucose-Capillary: 278 mg/dL — ABNORMAL HIGH (ref 70–99)
Glucose-Capillary: 326 mg/dL — ABNORMAL HIGH (ref 70–99)

## 2020-01-05 LAB — TYPE AND SCREEN
ABO/RH(D): B POS
Antibody Screen: NEGATIVE

## 2020-01-05 LAB — CULTURE, BLOOD (ROUTINE X 2)
Culture: NO GROWTH
Culture: NO GROWTH
Special Requests: ADEQUATE

## 2020-01-05 LAB — ABO/RH: ABO/RH(D): B POS

## 2020-01-05 SURGERY — AMPUTATION BELOW KNEE
Anesthesia: General | Site: Knee | Laterality: Right

## 2020-01-05 MED ORDER — CHLORHEXIDINE GLUCONATE 0.12 % MT SOLN
15.0000 mL | Freq: Once | OROMUCOSAL | Status: AC
  Administered 2020-01-05: 15 mL via OROMUCOSAL
  Filled 2020-01-05: qty 15

## 2020-01-05 MED ORDER — ACETAMINOPHEN 325 MG PO TABS
325.0000 mg | ORAL_TABLET | Freq: Four times a day (QID) | ORAL | Status: DC | PRN
  Administered 2020-01-06 – 2020-01-08 (×3): 650 mg via ORAL
  Administered 2020-01-08: 325 mg via ORAL
  Administered 2020-01-09: 650 mg via ORAL
  Administered 2020-01-09: 325 mg via ORAL
  Administered 2020-01-09: 650 mg via ORAL
  Administered 2020-01-10 (×2): 325 mg via ORAL
  Administered 2020-01-11 – 2020-01-12 (×6): 650 mg via ORAL
  Administered 2020-01-12: 325 mg via ORAL
  Administered 2020-01-13 – 2020-01-15 (×8): 650 mg via ORAL
  Filled 2020-01-05 (×5): qty 2
  Filled 2020-01-05: qty 1
  Filled 2020-01-05 (×7): qty 2
  Filled 2020-01-05 (×2): qty 1
  Filled 2020-01-05 (×3): qty 2
  Filled 2020-01-05: qty 1
  Filled 2020-01-05 (×5): qty 2

## 2020-01-05 MED ORDER — ROPIVACAINE HCL 5 MG/ML IJ SOLN
INTRAMUSCULAR | Status: DC | PRN
Start: 2020-01-05 — End: 2020-01-05
  Administered 2020-01-05: 20 mL via PERINEURAL

## 2020-01-05 MED ORDER — PHENYLEPHRINE 40 MCG/ML (10ML) SYRINGE FOR IV PUSH (FOR BLOOD PRESSURE SUPPORT)
PREFILLED_SYRINGE | INTRAVENOUS | Status: DC | PRN
  Administered 2020-01-05 (×2): 40 ug via INTRAVENOUS

## 2020-01-05 MED ORDER — METHOCARBAMOL 1000 MG/10ML IJ SOLN: 500.0000 mg | Freq: Four times a day (QID) | INTRAVENOUS | Status: DC | PRN

## 2020-01-05 MED ORDER — MIDAZOLAM HCL 2 MG/2ML IJ SOLN
INTRAMUSCULAR | Status: AC
  Administered 2020-01-05: 1 mg
  Filled 2020-01-05: qty 2

## 2020-01-05 MED ORDER — LACTATED RINGERS IV SOLN: INTRAVENOUS | Status: DC

## 2020-01-05 MED ORDER — BUPIVACAINE HCL (PF) 0.25 % IJ SOLN
INTRAMUSCULAR | Status: DC | PRN
Start: 2020-01-05 — End: 2020-01-05
  Administered 2020-01-05: 20 mL

## 2020-01-05 MED ORDER — SODIUM CHLORIDE 0.9 % IV SOLN: INTRAVENOUS | Status: DC

## 2020-01-05 MED ORDER — DEXAMETHASONE SODIUM PHOSPHATE 4 MG/ML IJ SOLN
INTRAMUSCULAR | Status: DC | PRN
  Administered 2020-01-05: 4 mg via INTRAVENOUS

## 2020-01-05 MED ORDER — PROPOFOL 10 MG/ML IV BOLUS
INTRAVENOUS | Status: DC | PRN
  Administered 2020-01-05: 100 mg via INTRAVENOUS

## 2020-01-05 MED ORDER — FENTANYL CITRATE (PF) 100 MCG/2ML IJ SOLN
INTRAMUSCULAR | Status: AC
  Administered 2020-01-05: 50 ug
  Filled 2020-01-05: qty 2

## 2020-01-05 MED ORDER — MIDAZOLAM HCL 2 MG/2ML IJ SOLN
INTRAMUSCULAR | Status: AC
  Filled 2020-01-05: qty 2

## 2020-01-05 MED ORDER — FENTANYL CITRATE (PF) 100 MCG/2ML IJ SOLN: 50.0000 ug | Freq: Once | INTRAMUSCULAR | Status: AC

## 2020-01-05 MED ORDER — METHOCARBAMOL 500 MG PO TABS
500.0000 mg | ORAL_TABLET | Freq: Four times a day (QID) | ORAL | Status: DC | PRN
  Administered 2020-01-05 – 2020-01-15 (×12): 500 mg via ORAL
  Filled 2020-01-05 (×13): qty 1

## 2020-01-05 MED ORDER — EPHEDRINE SULFATE-NACL 50-0.9 MG/10ML-% IV SOSY
PREFILLED_SYRINGE | INTRAVENOUS | Status: DC | PRN
  Administered 2020-01-05 (×3): 10 mg via INTRAVENOUS

## 2020-01-05 MED ORDER — OXYCODONE HCL 5 MG PO TABS
5.0000 mg | ORAL_TABLET | ORAL | Status: DC | PRN
  Administered 2020-01-05: 5 mg via ORAL
  Administered 2020-01-06 (×3): 10 mg via ORAL
  Administered 2020-01-06: 5 mg via ORAL
  Administered 2020-01-07 – 2020-01-15 (×13): 10 mg via ORAL
  Filled 2020-01-05 (×9): qty 2
  Filled 2020-01-05: qty 1
  Filled 2020-01-05 (×5): qty 2
  Filled 2020-01-05: qty 1
  Filled 2020-01-05 (×4): qty 2

## 2020-01-05 MED ORDER — METOCLOPRAMIDE HCL 5 MG PO TABS: 5.0000 mg | ORAL_TABLET | Freq: Three times a day (TID) | ORAL | Status: DC | PRN

## 2020-01-05 MED ORDER — HYDROMORPHONE HCL 1 MG/ML IJ SOLN
0.5000 mg | INTRAMUSCULAR | Status: DC | PRN
  Administered 2020-01-06 – 2020-01-15 (×4): 0.5 mg via INTRAVENOUS
  Filled 2020-01-05 (×4): qty 1

## 2020-01-05 MED ORDER — ONDANSETRON HCL 4 MG/2ML IJ SOLN
INTRAMUSCULAR | Status: DC | PRN
Start: 2020-01-05 — End: 2020-01-05
  Administered 2020-01-05: 4 mg via INTRAVENOUS

## 2020-01-05 MED ORDER — FENTANYL CITRATE (PF) 250 MCG/5ML IJ SOLN
INTRAMUSCULAR | Status: AC
  Filled 2020-01-05: qty 5

## 2020-01-05 MED ORDER — METOCLOPRAMIDE HCL 5 MG/ML IJ SOLN: 5.0000 mg | Freq: Three times a day (TID) | INTRAMUSCULAR | Status: DC | PRN

## 2020-01-05 MED ORDER — BUPIVACAINE LIPOSOME 1.3 % IJ SUSP
INTRAMUSCULAR | Status: DC | PRN
Start: 2020-01-05 — End: 2020-01-05
  Administered 2020-01-05: 10 mL via PERINEURAL

## 2020-01-05 MED ORDER — MIDAZOLAM HCL 2 MG/2ML IJ SOLN: 2.0000 mg | Freq: Once | INTRAMUSCULAR | Status: AC

## 2020-01-05 MED ORDER — ORAL CARE MOUTH RINSE: 15.0000 mL | Freq: Once | OROMUCOSAL | Status: AC

## 2020-01-05 MED ORDER — INSULIN ASPART 100 UNIT/ML ~~LOC~~ SOLN
6.0000 [IU] | Freq: Once | SUBCUTANEOUS | Status: AC
  Administered 2020-01-05: 6 [IU] via SUBCUTANEOUS

## 2020-01-05 MED ORDER — 0.9 % SODIUM CHLORIDE (POUR BTL) OPTIME
TOPICAL | Status: DC | PRN
  Administered 2020-01-05: 1000 mL

## 2020-01-05 SURGICAL SUPPLY — 37 items
BLADE SAW RECIP 87.9 MT (BLADE) ×2 IMPLANT
BLADE SURG 21 STRL SS (BLADE) ×2 IMPLANT
BNDG COHESIVE 6X5 TAN STRL LF (GAUZE/BANDAGES/DRESSINGS) IMPLANT
CANISTER WOUND CARE 500ML ATS (WOUND CARE) ×2 IMPLANT
COVER SURGICAL LIGHT HANDLE (MISCELLANEOUS) ×2 IMPLANT
COVER WAND RF STERILE (DRAPES) IMPLANT
CUFF TOURN SGL QUICK 34 (TOURNIQUET CUFF) ×2
CUFF TRNQT CYL 34X4.125X (TOURNIQUET CUFF) ×1 IMPLANT
DRAPE INCISE IOBAN 66X45 STRL (DRAPES) ×2 IMPLANT
DRAPE U-SHAPE 47X51 STRL (DRAPES) ×2 IMPLANT
DRESSING PREVENA PLUS CUSTOM (GAUZE/BANDAGES/DRESSINGS) ×1 IMPLANT
DRSG PREVENA PLUS CUSTOM (GAUZE/BANDAGES/DRESSINGS) ×2
DURAPREP 26ML APPLICATOR (WOUND CARE) ×2 IMPLANT
ELECT REM PT RETURN 9FT ADLT (ELECTROSURGICAL) ×2
ELECTRODE REM PT RTRN 9FT ADLT (ELECTROSURGICAL) ×1 IMPLANT
GLOVE BIOGEL PI IND STRL 9 (GLOVE) ×1 IMPLANT
GLOVE BIOGEL PI INDICATOR 9 (GLOVE) ×1
GLOVE SURG ORTHO 9.0 STRL STRW (GLOVE) ×2 IMPLANT
GOWN STRL REUS W/ TWL XL LVL3 (GOWN DISPOSABLE) ×2 IMPLANT
GOWN STRL REUS W/TWL XL LVL3 (GOWN DISPOSABLE) ×4
KIT BASIN OR (CUSTOM PROCEDURE TRAY) ×2 IMPLANT
KIT TURNOVER KIT B (KITS) ×2 IMPLANT
MANIFOLD NEPTUNE II (INSTRUMENTS) ×2 IMPLANT
NS IRRIG 1000ML POUR BTL (IV SOLUTION) ×2 IMPLANT
PACK ORTHO EXTREMITY (CUSTOM PROCEDURE TRAY) ×2 IMPLANT
PAD ARMBOARD 7.5X6 YLW CONV (MISCELLANEOUS) ×2 IMPLANT
PREVENA RESTOR ARTHOFORM 46X30 (CANNISTER) ×2 IMPLANT
SPONGE LAP 18X18 RF (DISPOSABLE) IMPLANT
STAPLER VISISTAT 35W (STAPLE) IMPLANT
STOCKINETTE IMPERVIOUS LG (DRAPES) ×2 IMPLANT
SUT ETHILON 2 0 PSLX (SUTURE) IMPLANT
SUT SILK 2 0 (SUTURE) ×2
SUT SILK 2-0 18XBRD TIE 12 (SUTURE) ×1 IMPLANT
SUT VIC AB 1 CTX 27 (SUTURE) ×4 IMPLANT
TOWEL GREEN STERILE (TOWEL DISPOSABLE) ×2 IMPLANT
TUBE CONNECTING 12X1/4 (SUCTIONS) ×2 IMPLANT
YANKAUER SUCT BULB TIP NO VENT (SUCTIONS) ×2 IMPLANT

## 2020-01-05 NOTE — Progress Notes (Addendum)
Family Medicine Teaching Service Daily Progress Note Intern Pager: [phone]  Patient name: Adam Ray Medical record number: [id number] Date of birth: [birth year] Age: 64 y.o. Gender: male  Primary Care Provider: [name], MD Consultants: Orthopaedics and Pharmacy Code Status: FULL  Pt Overview and Major Events to Date:  Hospital Day:6 12/31/2019: admitted forLeg Swelling 01/02/2020: Seen by Ortho, recommend transtibial amputation  Assessment and Plan: Adam Ray a 64 y.o.malewho presented w/ lower extremity edema and severe cellulitis.  PMHx s/fhypertension, CAD, PAD, HFpEF, insulin-dependent diabetes, CKD 2, OA, history of alcohol abuse.  Extensive cellulitis with concern for possible abscess formation and osteomyelitis of right JY:NWGNF Cellulitis appears to remain within borders markedat admission, not worsening.Patient is on day6of vancomycin and Zosyn. He does not have a leukocytosis and his blood cultures are negative to date. MRI was performedand concluded osteomyelitis in the distal 3.5cm of the second metatarsal with subsequent marrow edema in the distal 1.2 cm of the third metatarsal. Patient Cr decreased after stopping Lasix. Patient will receive his surgery today.  Will reassess need for abx after surgery   Ortho consult, appreciate recommendations  Per ortho, patient will recieve transtibial amputation today  should cont. abx IV   Continue to elevate lower extremities  Holding Lovenox for surgery   Venous stasis  1+ pitting in B/L LE. At home, takes 60 mg lasix BID. Patient reports no recent improvement.  Will stop Lasix due to elevated BUN/Cr ratio and elevated Cr.  Continue to elevatelower extremities.  HTN CAD PAD HFpEF Most recent echo in January 2020 shows ejection fraction 65 to 70% with grade 1 diastolic dysfunction. Mild mitral regurg. Blood pressures overall stable overnight. At home, on lasix and lisinopril, DAPT  (ASA + brillianta), rosuvastatin.    Holding ASA, and Brillanta for surgery will restart after surgery  Continue home rousuvastatin  IDDM II  At home, on lantus 80 units BID + 40 units BID, jardiance 12.5 daily. We have continued to readjust the dose as patient has been having morning hypoglycemia. Patient did not receive his Novolog 30 U BID, morning BGL was 247, but continued to trend downward to  140>108. The patient is currently NPO for surgery.  We will stop 30 U Novologas patient did not receive the Novolog 30 U BID and BGL of 108 is acceptable patient.   Continue 60 U daily = 30 U BID and SS  Holding jardiance   CBG QID   Hx of ETOH abuse  Continue CIWA   CKD II  Mild proteinurianoted 7/4.Cr trended downward after stopping Lasix. - BMP  Continue to monitor in setting of abx   OA On Relafen at home, 500 mg daily.  Held on admission, suggest revaluating this at f/u at this is not standard care for OA and patient has CKDII.  Muscular pain  Takes baclofen daily at home.   Continue baclofen  Fungal infection ? Per med rec, patient takes nystatin 100 mg daily. Appears last addressed in January 2021. Will hold this medication at this time and continue to monitor via physical exam daily   FEN/GI: NPO will restart heart healthy carb modified, after surgery PPx: Held for surgery  Disposition: Inpatient  Subjective:  Patient is resting in bed comfortably. His wife is back in the room. The patient had no overnight events and is reports he is ready for his procedure.   Objective: Temp:  [97.7 F (36.5 C)-98.7 F (37.1 C)] 98.4 F (36.9 C) (07/07 0612) Pulse  Rate:  [62-65] 65 (07/07 0612) Resp:  [14-18] 18 (07/07 0612) BP: (113-122)/(69-74) 120/72 (07/07 0612) SpO2:  [97 %-100 %] 100 % (07/07 0612) Weight:  [81.6 kg] 81.6 kg (07/07 0612) Physical Exam: General: Patient appears to be resting comfortably.  Cardiovascular: Regular rate and rhythm no  murmur noted Respiratory: Clear and equal to bilateral ausc Abdomen: Normoactive bowel sounds Extremities: LLE remains erythematous and edematous, erythema remains in the area of demarcation, Bil 3+ pitting   Laboratory: Recent Labs  Lab 01/02/20 0405 01/04/20 0435 01/05/20 0301  WBC 5.4 4.8 4.5  HGB 12.6* 13.2 12.3*  HCT 37.9* 40.3 37.9*  PLT 154 165 163   Recent Labs  Lab 12/31/19 2052 01/01/20 0432 01/03/20 1005 01/04/20 0435 01/05/20 0301  NA 134*   < > 133* 136 132*  K 4.0   < > 4.1 3.8 4.3  CL 97*   < > 95* 97* 98  CO2 26   < > 27 28 25   BUN 12   < > 20 24* 25*  CREATININE 1.38*   < > 1.38* 1.41* 1.29*  CALCIUM 8.8*   < > 8.6* 8.7* 8.6*  PROT 6.5  --   --   --   --   BILITOT 0.9  --   --   --   --   ALKPHOS 106  --   --   --   --   ALT 31  --   --   --   --   AST 30  --   --   --   --   GLUCOSE 223*   < > 225* 74 247*   < > = values in this interval not displayed.    Imaging/Diagnostic Tests: 01/05/2020- EKG- NSR  Freida Busman, MD 01/05/2020, 6:17 AM PGY-1, [place] Intern pager: [phone], text pages welcome

## 2020-01-05 NOTE — Op Note (Signed)
   Date of Surgery: 01/05/2020  INDICATIONS: Mr. Fellers is a 64 y.o.-year-old male who presents with osteomyelitis abscess right foot.  PREOPERATIVE DIAGNOSIS: Abscess osteomyelitis right foot  POSTOPERATIVE DIAGNOSIS: Same.  PROCEDURE: Transtibial amputation Application of Prevena wound VAC  SURGEON: Sharol Given, M.D.  ANESTHESIA:  general  IV FLUIDS AND URINE: See anesthesia.  ESTIMATED BLOOD LOSS: Minimal mL.  COMPLICATIONS: None.  DESCRIPTION OF PROCEDURE: The patient was brought to the operating room and underwent a general anesthetic. After adequate levels of anesthesia were obtained patient's lower extremity was prepped using DuraPrep draped into a sterile field. A timeout was called. The foot was draped out of the sterile field with impervious stockinette. A transverse incision was made 11 cm distal to the tibial tubercle. This curved proximally and a large posterior flap was created. The tibia was transected 1 cm proximal to the skin incision. The fibula was transected just proximal to the tibial incision. The tibia was beveled anteriorly. A large posterior flap was created. The sciatic nerve was pulled cut and allowed to retract. The vascular bundles were suture ligated with 2-0 silk. The deep and superficial fascial layers were closed using #1 Vicryl. The skin was closed using staples and 2-0 nylon. The wound was covered with a Prevena wound VAC. There was a good suction fit. A prosthetic shrinker was applied. Patient was extubated taken to the PACU in stable condition.   DISCHARGE PLANNING:  Antibiotic duration: 24 hours postoperatively  Weightbearing: Nonweightbearing on the right  Pain medication: Opioid pathway  Dressing care/ Wound VAC: Continue wound VAC for 1 week after discharge  Discharge to: Anticipate discharge to skilled nursing.  Follow-up: In the office 1 week post operative.  Meridee Score, MD [place] 12:02 PM

## 2020-01-05 NOTE — Anesthesia Postprocedure Evaluation (Signed)
Anesthesia Post Note  Patient: Adam Ray  Procedure(s) Performed: RIGHT BELOW KNEE AMPUTATION (Right Knee)     Patient location during evaluation: PACU Anesthesia Type: General Level of consciousness: awake and alert Pain management: pain level controlled Vital Signs Assessment: post-procedure vital signs reviewed and stable Respiratory status: spontaneous breathing, nonlabored ventilation, respiratory function stable and patient connected to nasal cannula oxygen Cardiovascular status: blood pressure returned to baseline and stable Postop Assessment: no apparent nausea or vomiting Anesthetic complications: no   No complications documented.  Last Vitals:  Vitals:   01/05/20 1420 01/05/20 1539  BP: 108/61 110/64  Pulse: 61 64  Resp: 20 20  Temp: 36.7 C 36.5 C  SpO2: 97% 95%    Last Pain:  Vitals:   01/05/20 1539  TempSrc: Oral  PainSc: 0-No pain                 Daylah Sayavong S

## 2020-01-05 NOTE — Interval H&P Note (Signed)
History and Physical Interval Note:  01/05/2020 6:50 AM  Adam Ray  has presented today for surgery, with the diagnosis of Left Foot Osteomyelitis.  The various methods of treatment have been discussed with the patient and family. After consideration of risks, benefits and other options for treatment, the patient has consented to  Procedure(s): RIGHT BELOW KNEE AMPUTATION (Right) as a surgical intervention.  The patient's history has been reviewed, patient examined, no change in status, stable for surgery.  I have reviewed the patient's chart and labs.  Questions were answered to the patient's satisfaction.     Newt Minion

## 2020-01-05 NOTE — Transfer of Care (Signed)
Immediate Anesthesia Transfer of Care Note  Patient: Adam Ray  Procedure(s) Performed: RIGHT BELOW KNEE AMPUTATION (Right Knee)  Patient Location: PACU  Anesthesia Type:GA combined with regional for post-op pain  Level of Consciousness: awake and drowsy  Airway & Oxygen Therapy: Patient Spontanous Breathing and Patient connected to nasal cannula oxygen  Post-op Assessment: Report given to RN and Post -op Vital signs reviewed and stable  Post vital signs: Reviewed and stable  Last Vitals:  Vitals Value Taken Time  BP 118/66 01/05/20 1204  Temp 36.2 C 01/05/20 1204  Pulse 57 01/05/20 1205  Resp 25 01/05/20 1205  SpO2 97 % 01/05/20 1205  Vitals shown include unvalidated device data.  Last Pain:  Vitals:   01/05/20 1055  TempSrc:   PainSc: 0-No pain         Complications: No complications documented.

## 2020-01-05 NOTE — Progress Notes (Signed)
Md on call paged [phone] [phone] no HS coverage. Arthor Captain LPN

## 2020-01-05 NOTE — Anesthesia Procedure Notes (Signed)
Anesthesia Regional Block: Adductor canal block   Pre-Anesthetic Checklist: ,, timeout performed, Correct Patient, Correct Site, Correct Laterality, Correct Procedure, Correct Position, site marked, Risks and benefits discussed,  Surgical consent,  Pre-op evaluation,  At surgeon's request and post-op pain management  Laterality: Right  Prep: chloraprep       Needles:  Injection technique: Single-shot  Needle Type: Echogenic Needle     Needle Length: 9cm  Needle Gauge: 21     Additional Needles:   Narrative:  Start time: 01/05/2020 10:50 AM End time: 01/05/2020 10:56 AM Injection made incrementally with aspirations every 5 mL.  Performed by: Personally  Anesthesiologist: Albertha Ghee, MD  Additional Notes: Pt tolerated the procedure well.

## 2020-01-05 NOTE — Progress Notes (Signed)
Contacted family medicine regarding level of pt care , was told pt could transfer to [facility] or ortho unit  Will continue to monitor

## 2020-01-05 NOTE — Progress Notes (Signed)
Pt transferred to 6N20 via hospital bed. Report given to [facility] LPN.

## 2020-01-05 NOTE — Anesthesia Procedure Notes (Signed)
Procedure Name: LMA Insertion Date/Time: 01/05/2020 11:17 AM Performed by: Inda Coke, CRNA Pre-anesthesia Checklist: Patient identified, Emergency Drugs available, Suction available and Patient being monitored Patient Re-evaluated:Patient Re-evaluated prior to induction Oxygen Delivery Method: Circle System Utilized Preoxygenation: Pre-oxygenation with 100% oxygen Induction Type: IV induction Ventilation: Mask ventilation without difficulty LMA: LMA inserted LMA Size: 4.0 Number of attempts: 1 Airway Equipment and Method: Bite block Placement Confirmation: positive ETCO2 Tube secured with: Tape Dental Injury: Teeth and Oropharynx as per pre-operative assessment

## 2020-01-05 NOTE — Anesthesia Preprocedure Evaluation (Signed)
Anesthesia Evaluation  Patient identified by MRN, date of birth, ID band Patient awake    Reviewed: Allergy & Precautions, H&P , NPO status , Patient's Chart, lab work & pertinent test results  Airway Mallampati: II   Neck ROM: full    Dental   Pulmonary sleep apnea , COPD, Current Smoker and Patient abstained from smoking.,    breath sounds clear to auscultation       Cardiovascular hypertension, + Past MI and + Peripheral Vascular Disease   Rhythm:regular Rate:Normal     Neuro/Psych PSYCHIATRIC DISORDERS Anxiety Depression  Neuromuscular disease    GI/Hepatic   Endo/Other  diabetes, Type 2  Renal/GU Renal InsufficiencyRenal disease     Musculoskeletal  (+) Arthritis ,   Abdominal   Peds  Hematology   Anesthesia Other Findings   Reproductive/Obstetrics                             Anesthesia Physical Anesthesia Plan  ASA: III  Anesthesia Plan: General   Post-op Pain Management:  Regional for Post-op pain   Induction: Intravenous  PONV Risk Score and Plan: 1 and Ondansetron, Treatment may vary due to age or medical condition and Midazolam  Airway Management Planned: LMA  Additional Equipment:   Intra-op Plan:   Post-operative Plan: Extubation in OR  Informed Consent: I have reviewed the patients History and Physical, chart, labs and discussed the procedure including the risks, benefits and alternatives for the proposed anesthesia with the patient or authorized representative who has indicated his/her understanding and acceptance.       Plan Discussed with: CRNA, Anesthesiologist and Surgeon  Anesthesia Plan Comments:         Anesthesia Quick Evaluation

## 2020-01-05 NOTE — Anesthesia Procedure Notes (Signed)
Anesthesia Regional Block: Popliteal block   Pre-Anesthetic Checklist: ,, timeout performed, Correct Patient, Correct Site, Correct Laterality, Correct Procedure, Correct Position, site marked, Risks and benefits discussed,  Surgical consent,  Pre-op evaluation,  At surgeon's request and post-op pain management  Laterality: Right  Prep: chloraprep       Needles:  Injection technique: Single-shot  Needle Type: Echogenic Stimulator Needle          Additional Needles:   Procedures:, nerve stimulator,,,,,,,   Nerve Stimulator or Paresthesia:  Response: plantar flexion of foot, 0.45 mA,   Additional Responses:   Narrative:  Start time: 01/05/2020 10:45 AM End time: 01/05/2020 10:52 AM Injection made incrementally with aspirations every 5 mL.  Performed by: Personally  Anesthesiologist: Albertha Ghee, MD  Additional Notes: Functioning IV was confirmed and monitors were applied.  A 35mm 21ga Arrow echogenic stimulator needle was used. Sterile prep and drape,hand hygiene and sterile gloves were used.  Negative aspiration and negative test dose prior to incremental administration of local anesthetic. The patient tolerated the procedure well.  Ultrasound guidance: relevent anatomy identified, needle position confirmed, local anesthetic spread visualized around nerve(s), vascular puncture avoided.  Image printed for medical record.

## 2020-01-06 ENCOUNTER — Encounter (HOSPITAL_COMMUNITY): Payer: Self-pay | Admitting: Orthopedic Surgery

## 2020-01-06 ENCOUNTER — Telehealth: Payer: Self-pay | Admitting: Family Medicine

## 2020-01-06 LAB — BASIC METABOLIC PANEL
Anion gap: 8 (ref 5–15)
BUN: 24 mg/dL — ABNORMAL HIGH (ref 8–23)
CO2: 25 mmol/L (ref 22–32)
Calcium: 8.2 mg/dL — ABNORMAL LOW (ref 8.9–10.3)
Chloride: 101 mmol/L (ref 98–111)
Creatinine, Ser: 1.38 mg/dL — ABNORMAL HIGH (ref 0.61–1.24)
GFR calc Af Amer: >60 mL/min (ref >60)
GFR calc non Af Amer: 54 mL/min — ABNORMAL LOW (ref >60)
Glucose, Bld: 243 mg/dL — ABNORMAL HIGH (ref 70–99)
Potassium: 4.6 mmol/L (ref 3.5–5.1)
Sodium: 134 mmol/L — ABNORMAL LOW (ref 135–145)

## 2020-01-06 LAB — CBC WITH DIFFERENTIAL/PLATELET
Abs Immature Granulocytes: 0.09 10*3/uL — ABNORMAL HIGH (ref 0.00–0.07)
Basophils Absolute: 0 10*3/uL (ref 0.0–0.1)
Basophils Relative: 1 %
Eosinophils Absolute: 0 10*3/uL (ref 0.0–0.5)
Eosinophils Relative: 1 %
HCT: 32.3 % — ABNORMAL LOW (ref 39.0–52.0)
Hemoglobin: 10.7 g/dL — ABNORMAL LOW (ref 13.0–17.0)
Immature Granulocytes: 2 %
Lymphocytes Relative: 19 %
Lymphs Abs: 1.1 10*3/uL (ref 0.7–4.0)
MCH: 30.1 pg (ref 26.0–34.0)
MCHC: 33.1 g/dL (ref 30.0–36.0)
MCV: 91 fL (ref 80.0–100.0)
Monocytes Absolute: 0.5 10*3/uL (ref 0.1–1.0)
Monocytes Relative: 9 %
Neutro Abs: 4.2 10*3/uL (ref 1.7–7.7)
Neutrophils Relative %: 68 %
Platelets: 180 10*3/uL (ref 150–400)
RBC: 3.55 MIL/uL — ABNORMAL LOW (ref 4.22–5.81)
RDW: 12.6 % (ref 11.5–15.5)
WBC: 5.9 10*3/uL (ref 4.0–10.5)
nRBC: 0 % (ref 0.0–0.2)

## 2020-01-06 LAB — GLUCOSE, CAPILLARY
Glucose-Capillary: 95 mg/dL (ref 70–99)
Glucose-Capillary: 157 mg/dL — ABNORMAL HIGH (ref 70–99)
Glucose-Capillary: 112 mg/dL — ABNORMAL HIGH (ref 70–99)
Glucose-Capillary: 163 mg/dL — ABNORMAL HIGH (ref 70–99)

## 2020-01-06 MED ORDER — ASPIRIN 81 MG PO CHEW
81.0000 mg | CHEWABLE_TABLET | Freq: Every day | ORAL | Status: DC
  Administered 2020-01-06 – 2020-01-15 (×10): 81 mg via ORAL
  Filled 2020-01-06 (×10): qty 1

## 2020-01-06 MED ORDER — ENOXAPARIN SODIUM 40 MG/0.4ML ~~LOC~~ SOLN
40.0000 mg | SUBCUTANEOUS | Status: DC
  Administered 2020-01-06 – 2020-01-15 (×10): 40 mg via SUBCUTANEOUS
  Filled 2020-01-06 (×10): qty 0.4

## 2020-01-06 MED ORDER — TICAGRELOR 90 MG PO TABS
90.0000 mg | ORAL_TABLET | Freq: Two times a day (BID) | ORAL | Status: DC
  Administered 2020-01-06 – 2020-01-15 (×18): 90 mg via ORAL
  Filled 2020-01-06 (×21): qty 1

## 2020-01-06 NOTE — Telephone Encounter (Signed)
Pt. appt has been cancelled for the July 13th because he is in the hospital.  Caller want to know if doctor can come by to see him.

## 2020-01-06 NOTE — Evaluation (Signed)
Physical Therapy Evaluation Patient Details Name: Adam Ray MRN: [id number] DOB: [birth year] Today's Date: 01/06/2020   History of Present Illness  64 y.o.male who presents with osteomyelitis abscess right foot. Patient s/p transtibial amputation and wound vac placement. Significant PMH: CKD, COPD, DM type 2.   Clinical Impression  Prior to admission, pt lives with his spouse in a house with one step to enter, is independent with mobility and occasionally requires assist with ADL's. On PT evaluation, pt requiring min assist for functional mobility. Hopping 40 feet with a walker and close chair follow. Education provided regarding knee positioning, desensitization techniques, prosthetic expectations and timeline. Pt would benefit from CIR to progress to a supervision-modI level prior to discharge home.     Follow Up Recommendations CIR    Equipment Recommendations  Rolling walker with 5" wheels;3in1 (PT);Wheelchair (measurements PT);Wheelchair cushion (measurements PT);R amputee swing away legrest   Recommendations for Other Services       Precautions / Restrictions Precautions Precautions: Fall;Other (comment) Precaution Comments: wound vac Restrictions Weight Bearing Restrictions: Yes RLE Weight Bearing: Non weight bearing      Mobility  Bed Mobility Overal bed mobility: Needs Assistance Bed Mobility: Supine to Sit     Supine to sit: Supervision;HOB elevated     General bed mobility comments: OOB in chair  Transfers Overall transfer level: Needs assistance Equipment used: Rolling walker (2 wheeled) Transfers: Sit to/from [facility] Sit to Stand: Min assist Stand pivot transfers: Min assist       General transfer comment: MinA to power up to stand and initially steady from recliner  Ambulation/Gait Ambulation/Gait assistance: Min assist Gait Distance (Feet): 40 Feet Assistive device: Rolling walker (2 wheeled) Gait Pattern/deviations: Step-to  pattern     General Gait Details: Hop to pattern, cues for upright posture, proper use of walker. Close chair follow utilized  Financial trader Rankin (Stroke Patients Only)       Balance Overall balance assessment: Needs assistance Sitting-balance support: Feet supported Sitting balance-Leahy Scale: Good     Standing balance support: Bilateral upper extremity supported Standing balance-Leahy Scale: Poor Standing balance comment: reliant on external support                             Pertinent Vitals/Pain Pain Assessment: No/denies pain Pain Score: 8  Pain Location: R residual limb Pain Descriptors / Indicators: Discomfort;Spasm Pain Intervention(s): Patient requesting pain meds-RN notified    Home Living Family/patient expects to be discharged to:: Private residence Living Arrangements: Spouse/significant other Available Help at Discharge: Family Type of Home: House Home Access: Stairs to enter   Technical brewer of Steps: 1, 6 inch step Home Layout: One level Home Equipment: None      Prior Function Level of Independence: Needs assistance   Gait / Transfers Assistance Needed: patient denies using cane or walker for ambulation  ADL's / Homemaking Assistance Needed: spouse assists with dressing, bathing as needed "he has good days and bad days" per spouse        Hand Dominance   Dominant Hand: Left    Extremity/Trunk Assessment   Upper Extremity Assessment Upper Extremity Assessment: Generalized weakness    Lower Extremity Assessment Lower Extremity Assessment: RLE deficits/detail RLE Deficits / Details: s/p BKA. At least anti gravity strength, knee extension to neutral    Cervical / Trunk Assessment Cervical /  Trunk Assessment: Normal  Communication   Communication: No difficulties  Cognition Arousal/Alertness: Awake/alert Behavior During Therapy: WFL for tasks  assessed/performed Overall Cognitive Status: Within Functional Limits for tasks assessed                                 General Comments: A&Ox4, initially stating 2019 then correcting to 2021. Often mumbles and is difficult to understand. Pt wife in room and seems to think he is at baseline.      General Comments      Exercises Amputee Exercises Quad Sets: Both;15 reps;Seated   Assessment/Plan    PT Assessment Patient needs continued PT services  PT Problem List Decreased strength;Decreased activity tolerance;Decreased balance;Decreased mobility;Pain       PT Treatment Interventions DME instruction;Gait training;Stair training;Functional mobility training;Therapeutic activities;Therapeutic exercise;Balance training;Patient/family education    PT Goals (Current goals can be found in the Care Plan section)  Acute Rehab PT Goals Patient Stated Goal: go to CIR PT Goal Formulation: With patient Time For Goal Achievement: 01/20/20 Potential to Achieve Goals: Good    Frequency Min 3X/week   Barriers to discharge        Co-evaluation               AM-PAC PT "6 Clicks" Mobility  Outcome Measure Help needed turning from your back to your side while in a flat bed without using bedrails?: None Help needed moving from lying on your back to sitting on the side of a flat bed without using bedrails?: A Little Help needed moving to and from a bed to a chair (including a wheelchair)?: A Little Help needed standing up from a chair using your arms (e.g., wheelchair or bedside chair)?: A Little Help needed to walk in hospital room?: A Little Help needed climbing 3-5 steps with a railing? : A Lot 6 Click Score: 18    End of Session Equipment Utilized During Treatment: Gait belt Activity Tolerance: Patient tolerated treatment well Patient left: in chair;with call bell/phone within reach;with chair alarm set;with family/visitor present Nurse Communication: Mobility  status PT Visit Diagnosis: Pain;Difficulty in walking, not elsewhere classified (R26.2);Unsteadiness on feet (R26.81) Pain - Right/Left: Right Pain - part of body:  (residual limb)    Time: 3790-2409 PT Time Calculation (min) (ACUTE ONLY): 27 min   Charges:   PT Evaluation $PT Eval Moderate Complexity: 1 Mod PT Treatments $Gait Training: 8-22 mins          Wyona Almas, PT, DPT Acute Rehabilitation Services Pager [phone] Office [phone]   Deno Etienne 01/06/2020, 1:30 PM

## 2020-01-06 NOTE — Progress Notes (Signed)
Patient is postop day 1 status post below-knee amputation.  He is doing well but complaining of muscle cramps.  This is in his postoperative stump.  Patient is sitting in bed canister his working with 0 cc.   Patient is requesting inpatient rehab.  I did tell him that this would depend on how he does with PT but I will certainly put a consult into inpatient rehab for their recommendations

## 2020-01-06 NOTE — Evaluation (Signed)
Occupational Therapy Evaluation Patient Details Name: Adam Ray MRN: [id number] DOB: 04/18/1956 Today's Date: 01/06/2020    History of Present Illness 64 y.o.-year-old male who presents with osteomyelitis abscess right foot. Patient s/p transtibial amputation and wound vac placement.    Clinical Impression   Patient with functional deficits listed below impacting safety and independence with self care. Patient min/mod A to power up to standing from edge of bed, min A with verbal cues sequencing with rolling walker to transfer to recliner. Patient/OT agree would be very beneficial for patient to receive further inpatient rehab before returning home to maximize strength, balance, safety awareness necessary for functional transfers and self care.    Follow Up Recommendations  CIR    Equipment Recommendations  3 in 1 bedside commode;Tub/shower bench;Wheelchair (measurements OT);Other (comment) (rolling walker)    Recommendations for Other Services Rehab consult     Precautions / Restrictions Precautions Precautions: Fall;Other (comment) Precaution Comments: wound vac Restrictions Weight Bearing Restrictions: Yes RLE Weight Bearing: Non weight bearing      Mobility Bed Mobility Overal bed mobility: Needs Assistance Bed Mobility: Supine to Sit     Supine to sit: Supervision;HOB elevated     General bed mobility comments: line management, requires increased time and use of bed rail  Transfers Overall transfer level: Needs assistance Equipment used: Rolling walker (2 wheeled) Transfers: Sit to/from [facility] Sit to Stand: Min assist;Mod assist Stand pivot transfers: Min assist       General transfer comment: patient require min/mod A to power up to standing from bed, min A for balance and safety with cues for sequencing to recliner    Balance Overall balance assessment: Needs assistance Sitting-balance support: Feet supported Sitting balance-Leahy  Scale: Good     Standing balance support: Bilateral upper extremity supported Standing balance-Leahy Scale: Poor Standing balance comment: reliant on external support                           ADL either performed or assessed with clinical judgement   ADL Overall ADL's : Needs assistance/impaired     Grooming: Set up;Sitting   Upper Body Bathing: Sitting;Set up   Lower Body Bathing: Sit to/from stand;Sitting/lateral leans;Maximal assistance   Upper Body Dressing : Set up;Sitting   Lower Body Dressing: Sitting/lateral leans;Sit to/from stand;Maximal assistance   Toilet Transfer: Minimal assistance;Stand-pivot;BSC;RW;Cueing for safety;Cueing for sequencing Toilet Transfer Details (indicate cue type and reason): to recliner, cues for safe hand placement and sequencing with walker Toileting- Clothing Manipulation and Hygiene: Maximal assistance;Sit to/from stand;Sitting/lateral lean       Functional mobility during ADLs: Minimal assistance;Rolling walker;Cueing for sequencing;Cueing for safety General ADL Comments: patient requiring increased assistance with self care due to decreased balance, activity tolerance, safety awareness, precautions                  Pertinent Vitals/Pain Pain Assessment: 0-10 Pain Score: 8  Pain Location: R residual limb Pain Descriptors / Indicators: Discomfort;Spasm Pain Intervention(s): Patient requesting pain meds-RN notified     Hand Dominance Left   Extremity/Trunk Assessment Upper Extremity Assessment Upper Extremity Assessment: Generalized weakness   Lower Extremity Assessment Lower Extremity Assessment: Defer to PT evaluation       Communication Communication Communication: No difficulties   Cognition Arousal/Alertness: Awake/alert Behavior During Therapy: WFL for tasks assessed/performed Overall Cognitive Status: Within Functional Limits for tasks assessed  Home Living Family/patient expects to be discharged to:: Private residence Living Arrangements: Spouse/significant other Available Help at Discharge: Family Type of Home: House Home Access: Stairs to enter [employer] of Steps: 1, 6 inch step   Home Layout: One level     Bathroom Shower/Tub: Teacher, early years/pre: Standard     Home Equipment: None          Prior Functioning/Environment Level of Independence: Needs assistance  Gait / Transfers Assistance Needed: patient denies using cane or walker for ambulation ADL's / Homemaking Assistance Needed: spouse assists with dressing, bathing as needed "he has good days and bad days" per spouse            OT Problem List: Decreased strength;Decreased activity tolerance;Impaired balance (sitting and/or standing);Decreased safety awareness;Decreased knowledge of use of DME or AE;Pain      OT Treatment/Interventions: Self-care/ADL training;Therapeutic exercise;Energy conservation;DME and/or AE instruction;Therapeutic activities;Patient/family education;Balance training    OT Goals(Current goals can be found in the care plan section) Acute Rehab OT Goals Patient Stated Goal: go to CIR OT Goal Formulation: With patient/family Time For Goal Achievement: 01/20/20 Potential to Achieve Goals: Good  OT Frequency: Min 2X/week    AM-PAC OT "6 Clicks" Daily Activity     Outcome Measure Help from another person eating meals?: None Help from another person taking care of personal grooming?: A Little Help from another person toileting, which includes using toliet, bedpan, or urinal?: A Lot Help from another person bathing (including washing, rinsing, drying)?: A Lot Help from another person to put on and taking off regular upper body clothing?: A Little Help from another person to put on and taking off regular lower body clothing?: A Lot 6 Click Score: 16   End of Session Equipment Utilized During  Treatment: Rolling walker Nurse Communication: Mobility status;Patient requests pain meds  Activity Tolerance: Patient tolerated treatment well Patient left: in chair;with call bell/phone within reach;with chair alarm set  OT Visit Diagnosis: Unsteadiness on feet (R26.81);Other abnormalities of gait and mobility (R26.89);Pain Pain - Right/Left: Right Pain - part of body: Leg                Time: 2202-5427 OT Time Calculation (min): 48 min Charges:  OT General Charges $OT Visit: 1 Visit OT Evaluation $OT Eval Moderate Complexity: 1 Mod OT Treatments $Self Care/Home Management : 23-37 mins  Delbert Phenix OT OT office: [place] 01/06/2020, 1:16 PM

## 2020-01-06 NOTE — TOC Initial Note (Addendum)
Transition of Care [facility]) - Initial/Assessment Note    Patient Details  Name: Adam Ray MRN: [id number] Date of Birth: 1955/09/27  Transition of Care [facility]) CM/SW Contact:    Marilu Favre, RN Phone Number: 01/06/2020, 2:47 PM  Clinical Narrative:                  Received a message from OT, patient and wife had questions regarding insurance etc.   Called into patient room and spoke to [place] and Patient.   Patient has VA Benefits and either [place] or [employer].   Wife stated they were told CIR does not accept VA( see CIR note) .  Discussed SNF vs HH  Discussed DME recommendations . Patient has no DME at home. Asked for patient's home address in case he decides to go home with home health. Patient became upset and staed " you people do not need to know my street address all you need is my PO BOX .". Unable to arrange home health without street address.  Wife wanted NCM "to sign him up for medicaid or an insurance   " . Explained TOC does sign people up for insurance. For medicaid directed wife and patient to Department of Social Services.    PAtient states he has two PCP's one here and one at [facility]. Patient did consent for NCM to call VA and fax clinicals.   Center [phone].  PCP at [facility] is DR [facility] fax [phone]   SW at [place] is [employer] phone [phone] ext [phone].  [name] is out of the office until January 11, 2020 , called covering [place] at ext [phone]  , she is aware patient is s/p Amputation, if patient decides to use his VA benefit please page Abundio Miu at [phone] and she will assist. faxed clinicals to PCP.   Patient Goals and CMS Choice Patient states their goals for this hospitalization and ongoing recovery are:: rehab CMS Medicare.gov Compare Post Acute Care list provided to:: Patient Choice offered to / list presented to : Patient  Expected Discharge Plan and Services     Discharge  Planning Services: CM Consult   Living arrangements for the past 2 months: Single Family Home                                      Prior Living Arrangements/Services Living arrangements for the past 2 months: Single Family Home Lives with:: Spouse Patient language and need for interpreter reviewed:: Yes Do you feel safe going back to the place where you live?: Yes      Need for Family Participation in Patient Care: Yes (Comment) Care giver support system in place?: Yes (comment)   Criminal Activity/Legal Involvement Pertinent to Current Situation/Hospitalization: No - Comment as needed  Activities of Daily Living Home Assistive Devices/Equipment: Cane (specify quad or straight) ADL Screening (condition at time of admission) Patient's cognitive ability adequate to safely complete daily activities?: Yes Is the patient deaf or have difficulty hearing?: No Does the patient have difficulty seeing, even when wearing glasses/contacts?: No Does the patient have difficulty concentrating, remembering, or making decisions?: No Patient able to express need for assistance with ADLs?: Yes Does the patient have difficulty dressing or bathing?: No Independently performs ADLs?: Yes (appropriate for developmental age) Does the patient have difficulty walking or  climbing stairs?: Yes Weakness of Legs: None Weakness of Arms/Hands: None  Permission Sought/Granted   Permission granted to share information with : Yes, Verbal Permission Granted  Share Information with NAME: spouse [name]           Emotional Assessment   Attitude/Demeanor/Rapport: Angry Affect (typically observed): Agitated Orientation: : Oriented to Self, Oriented to Place, Oriented to  Time, Oriented to Situation      Admission diagnosis:  Sepsis ([place]) [A41.9] Cellulitis [L03.90] Patient Active Problem List   Diagnosis Date Noted  . Cutaneous abscess of right foot   . Cellulitis of right foot 01/01/2020  .  Cellulitis 01/01/2020  . Nocturnal leg cramps 10/28/2019  . Hyperlipidemia 05/26/2019  . Edema leg 12/29/2018  . STEMI involving left circumflex coronary artery ([place]) 07/12/2018  . Status post transmetatarsal amputation of foot, right ([place]) 07/08/2018  . Limited mobility 05/12/2018  . Gingival foreign body 07/22/2017  . Anxiety state 06/02/2017  . Leg swelling 01/29/2017  . Idiopathic chronic venous hypertension of both lower extremities with inflammation 09/24/2016  . Testicular mass 04/18/2016  . Subacute osteomyelitis, right ankle and foot ([place]) 01/29/2016  . Diabetic polyneuropathy associated with type 2 diabetes mellitus ([place])   . Type 2 diabetes mellitus with neurologic complication, with long-term current use of insulin ([place]) 12/08/2015  . Pulmonary nodule 02/01/2015  . Depression 12/09/2013  . Warts, genital 08/20/2013  . 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 Diabetic retinopathy ([place]) 01/28/2013  . Sleep apnea 09/06/2010  . 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 Essential hypertension 11/09/2008  . COPD, mild ([place]) 10/06/2006  . SICKLE-CELL TRAIT 04/26/2006  . ERECTILE DYSFUNCTION 04/26/2006  . Tobacco abuse 04/26/2006  . PAD (peripheral artery disease) ([place]) 04/26/2006  . ALLERGIC RHINITIS 04/26/2006   PCP:  [name], MD Pharmacy:   [id number] - [place], [place] [address] [place] [address] Phone: [phone] Fax: [phone]  [place], [place] - [address] [place] [address] Phone: [phone] Fax: [place], [place] - [place] [phone] [place] [address] Phone: [phone] Fax: [phone]     Social Determinants of Health ([place]) Interventions    Readmission Risk  Interventions No flowsheet data found.

## 2020-01-06 NOTE — Progress Notes (Signed)
Inpatient Rehabilitation Admissions Coordinator  I await therapy evals to assist with planning dispo needs.  Danne Baxter, RN, MSN Rehab Admissions Coordinator [phone] 01/06/2020 10:46 AM

## 2020-01-06 NOTE — Progress Notes (Signed)
Inpatient Rehabilitation Admissions Coordinator  Inpatient rehab consult received. I met with patient and his wife. Patient has [place] community care which [facility] does not contract with as his primary. To use VA, his options would be [facility] in [place] or SNF using [place] contracts. Wife and patient believe he has [employer] , not [employer]. We discussed goals and expectations of an inpt rehab admit as well as need to pursue insurance approval for possible admit. They request that I clarify his insurance and pursue authorization. Not to use his [facility] for [facility]  Is not contracted. They also request assistance in pursuing Medicaid approval. I directed wife to contact [facility] social services to pursue application. I will follow up tomorrow with clarification of his medicare advantage policy, discuss cost of care and likelihood of approval from insurance.   Danne Baxter, RN, MSN Rehab Admissions Coordinator [phone] 01/06/2020 3:28 PM

## 2020-01-06 NOTE — Plan of Care (Signed)

## 2020-01-06 NOTE — Progress Notes (Addendum)
Family Medicine Teaching Service Daily Progress Note Intern Pager: [phone]  Patient name: Adam Ray Medical record number: [id number] Date of birth: 11-24-1955 Age: 64 y.o. Gender: male  Primary Care Provider: [name], MD Consultants: Orthopaedics and Pharmacy Code Status: FULL  Pt Overview and Major Events to Date:  Hospital Day:7 12/31/2019: admitted forLeg Swelling 01/02/2020: Seen by Ortho, recommend transtibial amputation 01/06/2020: Transtibial amputation with Dr. Sharol Given  Assessment and Plan: Adam Ray a 64 y.o.malewho presented w/ lower extremity edema and severe cellulitis.  PMHx s/fhypertension, CAD, PAD, HFpEF, insulin-dependent diabetes, CKD 2, OA, history of alcohol abuse.  Extensive cellulitis with concern for possible abscess formation and osteomyelitis of right FK:CLEXN Cellulitis appears to remain within borders markedat admission, not worsening.Patient is on day7of vancomycin and Zosyn. Patient received transtibial amputation and has responded well. Ortho recommends that patient have Wound vac for 1 week after discharge and be discharged to SNF and discontinue abx 24 hrs after surgery. Patient does not want morphine. He did have overnight episodes of hyperglycemia, we will continue to monitor patient response to surgery.He does not have a leukocytosis and his blood cultures are negative to date. Patient reports that has been having cramps at the site of amputation. This was addressed by ortho who prescribed muscle relaxants.  Will stop abx per ortho  Ortho consult, appreciate recommendations  Per ortho, patient recieved transtibial amputation with no complications  Will restart Lovenox   Ortho prescribed robaxin and baclofen for cramps at amputation site  Dilaudid and oxycodone PRN IDDM II   At home, on lantus 80 units BID + 40 units BID, jardiance 12.5 daily.Post-op patient had episode of hyperglycemia 326 and received a night time  dose of insulin.  Second reading was 243. We will continue to monitor patient response to amputation and readjust insulin as needed.   At this time continue 60 U daily and SS  Holding jardiance  CBG QID   Venous stasis At home, takes 60 mg lasix BID. Continue to elevatelower extremity.  HTN CAD PAD HFpEF Most recent echo in January 2020 shows ejection fraction 65 to 70% with grade 1 diastolic dysfunction. Mild mitral regurg. Blood pressures overall stable overnight. At home, on lasix and lisinopril, DAPT (ASA + brillianta), rosuvastatin.    Will restart ASA and Brillanta  Continue home rousuvastatin, lisinopril, metoprolol   Hx of ETOH abuse  Continue CIWA   CKD II  Mild proteinurianoted 7/4.Cr trended downward after stopping Lasix. - BMP  Continue to monitor in setting of abx   OA On Relafen at home, 500 mg daily.  Held on admission, suggest revaluating this at f/u at this is not standard care for OA and patient has CKDII.  Muscular pain  Takes baclofen daily at home.   Continue baclofen  Fungal infection ? Per med rec, patient takes nystatin 100 mg daily. Appears last addressed in January 2021. Will hold this medication at this time and continue to monitor via physical exam daily    FEN/GI: Heart carb modified PPx: Lovenox  Disposition:Inpatient  Subjective:  Patient and wife request to see Dr. Christena Flake as they want to understand what to expect in terms of pain post surgery. Patient and wife also report that he does not want morphine for pain. Patient wife staes, "morphine slows your organs down and will slowly kill you." He also reports that he want his wife made aware of his medication regimen. Patient reports that he has had constant cramps at the site  of the amputation. The patient does not want to keep his leg in the brace because it is "too heavy."  Objective: Temp:  [97.2 F (36.2 C)-99 F (37.2 C)] 97.9 F (36.6 C) (07/08  0521) Pulse Rate:  [52-70] 52 (07/08 0521) Resp:  [14-24] 16 (07/08 0521) BP: (90-118)/(47-71) 103/60 (07/08 0521) SpO2:  [95 %-100 %] 98 % (07/08 0521) Physical Exam: General: Resting in bed Cardiovascular: Regular weight and rhythm no murmurs detected Respiratory: Clear and equal to bilateral ausc.  Abdomen: Normoactive bowel sounds, no pain to palpation Extremities: Amputation site (R transtibial amputation) has wound vac and compression on elevated on pillow, does not wear his brace. L leg is warm with non pitting edema  Laboratory: Recent Labs  Lab 01/04/20 0435 01/05/20 0301 01/06/20 0109  WBC 4.8 4.5 5.9  HGB 13.2 12.3* 10.7*  HCT 40.3 37.9* 32.3*  PLT 165 163 180   Recent Labs  Lab 12/31/19 2052 01/01/20 0432 01/04/20 0435 01/05/20 0301 01/06/20 0109  NA 134*   < > 136 132* 134*  K 4.0   < > 3.8 4.3 4.6  CL 97*   < > 97* 98 101  CO2 26   < > 28 25 25   BUN 12   < > 24* 25* 24*  CREATININE 1.38*   < > 1.41* 1.29* 1.38*  CALCIUM 8.8*   < > 8.7* 8.6* 8.2*  PROT 6.5  --   --   --   --   BILITOT 0.9  --   --   --   --   ALKPHOS 106  --   --   --   --   ALT 31  --   --   --   --   AST 30  --   --   --   --   GLUCOSE 223*   < > 74 247* 243*   < > = values in this interval not displayed.      Imaging/Diagnostic Tests: 01/06/2020- Transtibial amputation No complications  Freida Busman, MD 01/06/2020, 7:19 AM PGY-1, [place] Intern pager: [phone], text pages welcome

## 2020-01-07 LAB — CBC WITH DIFFERENTIAL/PLATELET
Abs Immature Granulocytes: 0.09 10*3/uL — ABNORMAL HIGH (ref 0.00–0.07)
Basophils Absolute: 0.1 10*3/uL (ref 0.0–0.1)
Basophils Relative: 1 %
Eosinophils Absolute: 0.1 10*3/uL (ref 0.0–0.5)
Eosinophils Relative: 3 %
HCT: 33.2 % — ABNORMAL LOW (ref 39.0–52.0)
Hemoglobin: 10.7 g/dL — ABNORMAL LOW (ref 13.0–17.0)
Immature Granulocytes: 2 %
Lymphocytes Relative: 32 %
Lymphs Abs: 1.5 10*3/uL (ref 0.7–4.0)
MCH: 30 pg (ref 26.0–34.0)
MCHC: 32.2 g/dL (ref 30.0–36.0)
MCV: 93 fL (ref 80.0–100.0)
Monocytes Absolute: 0.5 10*3/uL (ref 0.1–1.0)
Monocytes Relative: 11 %
Neutro Abs: 2.4 10*3/uL (ref 1.7–7.7)
Neutrophils Relative %: 51 %
Platelets: 199 10*3/uL (ref 150–400)
RBC: 3.57 MIL/uL — ABNORMAL LOW (ref 4.22–5.81)
RDW: 12.7 % (ref 11.5–15.5)
WBC: 4.6 10*3/uL (ref 4.0–10.5)
nRBC: 0 % (ref 0.0–0.2)

## 2020-01-07 LAB — BASIC METABOLIC PANEL
Anion gap: 6 (ref 5–15)
BUN: 20 mg/dL (ref 8–23)
CO2: 25 mmol/L (ref 22–32)
Calcium: 8.1 mg/dL — ABNORMAL LOW (ref 8.9–10.3)
Chloride: 104 mmol/L (ref 98–111)
Creatinine, Ser: 1.2 mg/dL (ref 0.61–1.24)
GFR calc Af Amer: >60 mL/min (ref >60)
GFR calc non Af Amer: >60 mL/min (ref >60)
Glucose, Bld: 186 mg/dL — ABNORMAL HIGH (ref 70–99)
Potassium: 4.7 mmol/L (ref 3.5–5.1)
Sodium: 135 mmol/L (ref 135–145)

## 2020-01-07 LAB — GLUCOSE, CAPILLARY
Glucose-Capillary: 126 mg/dL — ABNORMAL HIGH (ref 70–99)
Glucose-Capillary: 105 mg/dL — ABNORMAL HIGH (ref 70–99)
Glucose-Capillary: 254 mg/dL — ABNORMAL HIGH (ref 70–99)
Glucose-Capillary: 282 mg/dL — ABNORMAL HIGH (ref 70–99)

## 2020-01-07 NOTE — Progress Notes (Signed)
Physical Therapy Treatment Patient Details Name: Adam Ray MRN: [id number] DOB: 28-Sep-1955 Today's Date: 01/07/2020    History of Present Illness 64 y.o.male who presents with osteomyelitis abscess right foot. Patient s/p transtibial amputation and wound vac placement. Significant PMH: CKD, COPD, DM type 2.     PT Comments     Pt making steady progress towards his physical therapy goals, remains motivated to participate. Session focused on bed level exercises for right residual limb strengthening/ROM and transfer/gait training. Pt requiring min assist for functional mobility. Hopping 50 feet with a walker and a close chair follow. Continue to recommend comprehensive inpatient rehab (CIR) for post-acute therapy needs.    Follow Up Recommendations  CIR     Equipment Recommendations  Rolling walker with 5" wheels;3in1 (PT);Wheelchair (measurements PT);Wheelchair cushion (measurements PT);Other (comment)    Recommendations for Other Services       Precautions / Restrictions Precautions Precautions: Fall;Other (comment) Precaution Comments: wound vac Restrictions Weight Bearing Restrictions: Yes RLE Weight Bearing: Non weight bearing    Mobility  Bed Mobility Overal bed mobility: Needs Assistance Bed Mobility: Supine to Sit     Supine to sit: Supervision     General bed mobility comments: Increased time, HOB elevated, no physical assist required  Transfers Overall transfer level: Needs assistance Equipment used: Rolling walker (2 wheeled) Transfers: Sit to/from [facility] Sit to Stand: Min assist         General transfer comment: MinA to power up to stand and initially steady from edge of bed  Ambulation/Gait Ambulation/Gait assistance: Min assist Gait Distance (Feet): 50 Feet Assistive device: Rolling walker (2 wheeled) Gait Pattern/deviations: Step-to pattern Gait velocity: decreased Gait velocity interpretation: <1.8 ft/sec, indicate of  risk for recurrent falls General Gait Details: Hop to pattern, slow pace, cues for walker proximity.    Stairs             Wheelchair Mobility    Modified Rankin (Stroke Patients Only)       Balance Overall balance assessment: Needs assistance Sitting-balance support: Feet supported Sitting balance-Leahy Scale: Good     Standing balance support: Bilateral upper extremity supported Standing balance-Leahy Scale: Poor Standing balance comment: reliant on external support                            Cognition Arousal/Alertness: Awake/alert Behavior During Therapy: WFL for tasks assessed/performed Overall Cognitive Status: Within Functional Limits for tasks assessed                                 General Comments: WFL for basic mobility      Exercises Amputee Exercises Quad Sets: Seated;Right;10 reps Hip ABduction/ADduction: Right;10 reps;Supine Knee Extension: Right;10 reps;Seated Straight Leg Raises: Right;10 reps;Supine    General Comments        Pertinent Vitals/Pain Pain Assessment: Faces Faces Pain Scale: Hurts even more Pain Location: R residual limb Pain Descriptors / Indicators: Discomfort;Guarding;Grimacing Pain Intervention(s): Limited activity within patient's tolerance;Monitored during session;Repositioned;Other (comment) (pain medication not due)    Home Living                      Prior Function            PT Goals (current goals can now be found in the care plan section) Acute Rehab PT Goals Patient Stated Goal: go to CIR PT Goal  Formulation: With patient Time For Goal Achievement: 01/20/20 Potential to Achieve Goals: Good Progress towards PT goals: Progressing toward goals    Frequency    Min 3X/week      PT Plan Current plan remains appropriate    Co-evaluation              AM-PAC PT "6 Clicks" Mobility   Outcome Measure  Help needed turning from your back to your side while in a  flat bed without using bedrails?: None Help needed moving from lying on your back to sitting on the side of a flat bed without using bedrails?: A Little Help needed moving to and from a bed to a chair (including a wheelchair)?: A Little Help needed standing up from a chair using your arms (e.g., wheelchair or bedside chair)?: A Little Help needed to walk in hospital room?: A Little Help needed climbing 3-5 steps with a railing? : A Lot 6 Click Score: 18    End of Session Equipment Utilized During Treatment: Gait belt Activity Tolerance: Patient tolerated treatment well Patient left: in chair;with call bell/phone within reach;with chair alarm set;with family/visitor present Nurse Communication: Mobility status PT Visit Diagnosis: Pain;Difficulty in walking, not elsewhere classified (R26.2);Unsteadiness on feet (R26.81) Pain - Right/Left: Right Pain - part of body:  (residual limb)     Time: 7847-8412 PT Time Calculation (min) (ACUTE ONLY): 25 min  Charges:  $Gait Training: 8-22 mins $Therapeutic Exercise: 8-22 mins                       Wyona Almas, PT, DPT Acute Rehabilitation Services Pager [phone] Office [phone]    Deno Etienne 01/07/2020, 8:54 AM

## 2020-01-07 NOTE — Progress Notes (Signed)
Patient is postop day 2 status post below-knee amputation.  He is lying in bed comfortably however he has been struggling a little bit with muscle spasms.  He has been receiving Robaxin.  Vital signs stable alert lying in bed pain patient is wanting to go to inpatient rehab.  Hopefully his insurance clarification will help him move forward with this he will need a 1 week follow-up in our office at which time we will discontinue the wound VAC

## 2020-01-07 NOTE — Progress Notes (Signed)
Inpatient Rehabilitation Admissions Coordinator  I met with patient at bedside and spoke with his wife by phone. I clarified that his [employer] Medicare is no longer active and his [employer] Medicare is active and I have begun authorization. I also explained that it is doubtful to approve CIR admit. Patient and wife argumentative over why is [employer] no longer active. I have provided patient with both policy numbers and customer service phone numbers so that they can pursue answers to their questions. I will follow up on Monday if patient remains in house.  Barbara Boyette, RN, MSN Rehab Admissions Coordinator [phone] 01/07/2020 12:08 PM  

## 2020-01-07 NOTE — Plan of Care (Signed)
  Problem: Activity: Goal: Risk for activity intolerance will decrease Outcome: Progressing   Problem: Nutrition: Goal: Adequate nutrition will be maintained Outcome: Progressing   

## 2020-01-07 NOTE — Progress Notes (Addendum)
Family Medicine Teaching Service Daily Progress Note Intern Pager: [phone]  Patient name: Adam Ray Medical record number: [id number] Date of birth: 10-09-1955 Age: 64 y.o. Gender: male  Primary Care Provider: [name], MD Consultants: Orthopaedics and Pharmacy Code Status: FULL  Pt Overview and Major Events to Date:  Hospital Day:8 12/31/2019: admitted forLeg Swelling 01/02/2020: Seen by Ortho, recommend transtibial amputation 01/06/2020: Transtibial amputation with Dr. Sharol Given  Assessment and Plan: Adam Almond Foustis a 64 y.o.malewho presented w/ lower extremity edema and severe cellulitis.  PMHx s/fhypertension, CAD, PAD, HFpEF, insulin-dependent diabetes, CKD 2, OA, history of alcohol abuse.  Extensive cellulitis with concern for possible abscess formation and osteomyelitis of right JE:HUDJS  Cellulitis appears to remain within borders markedat admission, not worsening.Patient vancomycin and Zosyn discontinued 24 hours after surgery. Patient received transtibial amputation and has responded well. Ortho recommends that patient have Wound vac for 1 week after discharge and be discharged to SNF and discontinue abx 24 hrs after surgery. Patient would like inpatient rehab. He does not have a leukocytosis and his blood cultures are negative to date. Patient reported that muscle relaxants helped with his cramps at sight of amputation. Ortho consult, appreciate recommendations  Per ortho, patient recieved transtibial amputationwith no complications  Will restart Lovenox   Ortho prescribed robaxin and baclofen for cramps at amputation site  Dilaudid and oxycodone PRN   IDDM II   At home, on lantus 80 units BID + 40 units BID, jardiance 12.5 daily. We will continue to monitor patient response to amputation and readjust insulin as needed. Patient required 2 U SAI.   At this time continue 60 U daily and SS  Holding jardiance  CBG QID   Venous stasis At home,  takes 60 mg lasix BID. Continue to elevatelower extremity.  HTN CAD PAD HFpEF Most recent echo in January 2020 shows ejection fraction 65 to 70% with grade 1 diastolic dysfunction. Mild mitral regurg. Blood pressures overall stable overnight.BP range 112-163  At home, on lasix and lisinopril, DAPT (ASA + brillianta), rosuvastatin.    Continue ASA and Brillanta  Continue home rousuvastatin, lisinopril, metoprolol   Hx of ETOH abuse  Continue CIWA   CKD II  Mild proteinurianoted 7/4.Cr trendeddownward after stopping Lasix. - BMP  Continue to monitor in setting of abx   OA On Relafen at home, 500 mg daily.  Held on admission, suggest revaluating this at f/u at this is not standard care for OA and patient has CKDII.  Muscular pain  Takes baclofen daily at home.   Continue baclofen  Fungal infection ? Per med rec, patient takes nystatin 100 mg daily. Appears last addressed in January 2021. Will hold this medication at this time and continue to monitor via physical exam daily     FEN/GI: Heart carb modified PPx: Lovenox  Disposition: SNF vs CIR, medically stable  Subjective:  Patient is waiting for insurance confirmation on where he will go for rehab. Patient reports that he doing well and his cramping is getting better with the muscle relaxants and the pain medication. Patient had a bowel movement.   Objective: Temp:  [97.4 F (36.3 C)-98.6 F (37 C)] 98.6 F (37 C) (07/09 0504) Pulse Rate:  [54-57] 57 (07/09 0504) Resp:  [16-18] 18 (07/09 0504) BP: (94-118)/(62-70) 118/70 (07/09 0504) SpO2:  [97 %-100 %] 98 % (07/09 0504) Physical Exam: General: Patient resting in chair with leg elevated. Cardiovascular: Regular heart rate and rythym, no murmur detected Respiratory: Clear and equal  to bil. auscl. Abdomen: No pain to palp., normoactive bowel sounds Extremities: Wound elevated, LLE decreased edema  Laboratory: Recent Labs  Lab  01/05/20 0301 01/06/20 0109 01/07/20 0238  WBC 4.5 5.9 4.6  HGB 12.3* 10.7* 10.7*  HCT 37.9* 32.3* 33.2*  PLT 163 180 199   Recent Labs  Lab 12/31/19 2052 01/01/20 0432 01/05/20 0301 01/06/20 0109 01/07/20 0238  NA 134*   < > 132* 134* 135  K 4.0   < > 4.3 4.6 4.7  CL 97*   < > 98 101 104  CO2 26   < > 25 25 25   BUN 12   < > 25* 24* 20  CREATININE 1.38*   < > 1.29* 1.38* 1.20  CALCIUM 8.8*   < > 8.6* 8.2* 8.1*  PROT 6.5  --   --   --   --   BILITOT 0.9  --   --   --   --   ALKPHOS 106  --   --   --   --   ALT 31  --   --   --   --   AST 30  --   --   --   --   GLUCOSE 223*   < > 247* 243* 186*   < > = values in this interval not displayed.      Imaging/Diagnostic Tests:   Freida Busman, MD 01/07/2020, 7:46 AM PGY-1, [place] Intern pager: [phone], text pages welcome

## 2020-01-08 LAB — GLUCOSE, CAPILLARY
Glucose-Capillary: 268 mg/dL — ABNORMAL HIGH (ref 70–99)
Glucose-Capillary: 200 mg/dL — ABNORMAL HIGH (ref 70–99)
Glucose-Capillary: 203 mg/dL — ABNORMAL HIGH (ref 70–99)
Glucose-Capillary: 234 mg/dL — ABNORMAL HIGH (ref 70–99)

## 2020-01-08 NOTE — Progress Notes (Signed)
Family Medicine Teaching Service Daily Progress Note Intern Pager: [phone]  Patient name: Adam Ray Medical record number: [id number] Date of birth: Nov 27, 1955 Age: 64 y.o. Gender: male  Primary Care Provider: [name], MD Consultants: Orthopaedics and Pharmacy Code Status: FULL  Pt Overview and Major Events to Date:  Hospital Day:8 12/31/2019: admitted forLeg Swelling 01/02/2020: Seen by Ortho, recommend transtibial amputation 01/06/2020: Transtibial amputation with Dr. Sharol Given  Assessment and Plan: Adam Almond Foustis a 64 y.o.malewho presented w/ lower extremity edema and severe cellulitis.  PMHx s/fhypertension, CAD, PAD, HFpEF, insulin-dependent diabetes, CKD 2, OA, history of alcohol abuse.  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 Denies any pain. Working well with PT/OT. Awaiting SNF placement - PT/OT - f/u ortho - pain control PRN  - acetaminophen, robaxin, oxy IR  IDDM II- highly variable. CBGs overnight 105- 282 - sSSI - continue rosuvastatin  HTN- stable. wnl ON - continue lisinopril, metoprolol  CAD, PAD - continue ticagrelor, ASA, nitroglycerin PRN  HFpEF- stable. normovolemic - continue home meds  CKD II- normal renal function yesterday after discontinuing Abx - labs q72hrs  FEN/GI: Heart carb modified PPx: Lovenox  Disposition: SNF vs CIR, medically stable  Subjective:  Patient is doing well today. Denies any pain or concerns. Would like to see Dr. Sharol Given in person today.  Objective: Temp:  [97.9 F (36.6 C)-98.7 F (37.1 C)] 98.7 F (37.1 C) (07/09 2107) Pulse Rate:  [57-64] 62 (07/09 2107) Resp:  [18-19] 19 (07/09 2107) BP: (107-125)/(59-70) 125/59 (07/09 2107) SpO2:  [98 %-99 %] 99 % (07/09 2107) Physical Exam: General: Patient resting in bed, comfortable Cardiovascular: Regular heart rate and rythym, no murmur detected Respiratory: CTAB Abdomen: No pain to palp., normoactive bowel sounds Extremities: Wound  elevated, wound vac in place LLE 1+ pitting edema  Laboratory: Recent Labs  Lab 01/05/20 0301 01/06/20 0109 01/07/20 0238  WBC 4.5 5.9 4.6  HGB 12.3* 10.7* 10.7*  HCT 37.9* 32.3* 33.2*  PLT 163 180 199   Recent Labs  Lab 01/05/20 0301 01/06/20 0109 01/07/20 0238  NA 132* 134* 135  K 4.3 4.6 4.7  CL 98 101 104  CO2 25 25 25   BUN 25* 24* 20  CREATININE 1.29* 1.38* 1.20  CALCIUM 8.6* 8.2* 8.1*  GLUCOSE 247* 243* 186*   Imaging/Diagnostic Tests: No results found.   Richarda Osmond, DO 01/08/2020, 12:57 AM PGY-3, [place] Intern pager: [phone], text pages welcome

## 2020-01-09 DIAGNOSIS — E1141 Type 2 diabetes mellitus with diabetic mononeuropathy: Secondary | ICD-10-CM

## 2020-01-09 LAB — GLUCOSE, CAPILLARY
Glucose-Capillary: 185 mg/dL — ABNORMAL HIGH (ref 70–99)
Glucose-Capillary: 257 mg/dL — ABNORMAL HIGH (ref 70–99)
Glucose-Capillary: 233 mg/dL — ABNORMAL HIGH (ref 70–99)
Glucose-Capillary: 257 mg/dL — ABNORMAL HIGH (ref 70–99)

## 2020-01-09 NOTE — Progress Notes (Signed)
Family Medicine Teaching Service Daily Progress Note Intern Pager: [phone]  Patient name: Adam Ray Medical record number: [id number] Date of birth: January 01, 1956 Age: 64 y.o. Gender: male  Primary Care Provider: [name], MD Consultants: Orthopaedics and Pharmacy Code Status: FULL  Pt Overview and Major Events to Date:  Hospital day: 8 12/31/2019: admitted forLeg Swelling 01/02/2020: Seen by Ortho, recommend transtibial amputation 01/06/2020: Transtibial amputation with Dr. Sharol Given  Assessment and Plan: Adam Ray a 64 y.o.malewho presented w/ lower extremity edema and severe cellulitis.  PMHx s/fhypertension, CAD, PAD, HFpEF, insulin-dependent diabetes, CKD 2, OA, history of alcohol abuse.  POD#4 S/p Right transtibial amputation Pain well controlled.  Has been getting up into the chair.  Is to follow up with Orthopedic surgery in 1 week and they will discontinue wound vac. Patient remains medically stable for discharge to SNF vs CIR.  - PT/OT: rolling walker with 5' wheels, wheelchair, wheelchair cushion  - f/u ortho ~7/16 - pain control PRN: acetaminophen, robaxin, oxy IR  Type 2 Diabetes, non insulin dependent  Patient received 10 sliding scale insulin and 60 Lantus yesterday. Glucose ranged yesterday 270-230.  - sSSI - continue Lantus 60u daily  - continue rosuvastatin  HTN- stable Normotensive this morning.  - continue lisinopril, metoprolol  CAD, PAD - continue ticagrelor, ASA, nitroglycerin PRN  HFpEF- stable.  Appears euvolemic on exam.  - continue home meds  CKD II Stable following discontinuation of IV antibiotics.  - labs q72hrs  FEN/GI: Heart carb modified PPx: Lovenox  Disposition: SNF vs CIR, medically stable  Subjective:  Patient had no significant overnight events.   Objective: Temp:  [98.4 F (36.9 C)-98.8 F (37.1 C)] 98.7 F (37.1 C) (07/11 0600) Pulse Rate:  [55-61] 61 (07/11 0600) Resp:  [18-19] 19 (07/11 0600) BP:  (116-129)/(64-74) 116/67 (07/11 0600) SpO2:  [96 %-100 %] 96 % (07/11 0600) Physical Exam: GEN: pleasant older male, in no acute distress  CV: regular rate and rhythm, no murmurs RESP: no increased work of breathing, clear to ascultation bilaterally  ABD: Bowel sounds present. Soft, nontender, nondistended.  MSK: right BKA with stump protector, LLE non-pitting edema appreciated, good ROM  SKIN: warm, dry     Laboratory: Recent Labs  Lab 01/05/20 0301 01/06/20 0109 01/07/20 0238  WBC 4.5 5.9 4.6  HGB 12.3* 10.7* 10.7*  HCT 37.9* 32.3* 33.2*  PLT 163 180 199   Recent Labs  Lab 01/05/20 0301 01/06/20 0109 01/07/20 0238  NA 132* 134* 135  K 4.3 4.6 4.7  CL 98 101 104  CO2 25 25 25   BUN 25* 24* 20  CREATININE 1.29* 1.38* 1.20  CALCIUM 8.6* 8.2* 8.1*  GLUCOSE 247* 243* 186*   Imaging/Diagnostic Tests: No results found.   [name], DO 01/09/2020, 6:07 AM PGY-2, [place] Intern pager: [phone], text pages welcome

## 2020-01-10 ENCOUNTER — Ambulatory Visit: Payer: No Typology Code available for payment source | Admitting: Orthopedic Surgery

## 2020-01-10 LAB — BASIC METABOLIC PANEL
Anion gap: 6 (ref 5–15)
BUN: 19 mg/dL (ref 8–23)
CO2: 26 mmol/L (ref 22–32)
Calcium: 8.8 mg/dL — ABNORMAL LOW (ref 8.9–10.3)
Chloride: 103 mmol/L (ref 98–111)
Creatinine, Ser: 1.19 mg/dL (ref 0.61–1.24)
GFR calc Af Amer: >60 mL/min (ref >60)
GFR calc non Af Amer: >60 mL/min (ref >60)
Glucose, Bld: 208 mg/dL — ABNORMAL HIGH (ref 70–99)
Potassium: 4.1 mmol/L (ref 3.5–5.1)
Sodium: 135 mmol/L (ref 135–145)

## 2020-01-10 LAB — SURGICAL PATHOLOGY

## 2020-01-10 LAB — GLUCOSE, CAPILLARY
Glucose-Capillary: 215 mg/dL — ABNORMAL HIGH (ref 70–99)
Glucose-Capillary: 234 mg/dL — ABNORMAL HIGH (ref 70–99)
Glucose-Capillary: 186 mg/dL — ABNORMAL HIGH (ref 70–99)
Glucose-Capillary: 205 mg/dL — ABNORMAL HIGH (ref 70–99)

## 2020-01-10 MED ORDER — EMPAGLIFLOZIN 25 MG PO TABS
25.0000 mg | ORAL_TABLET | Freq: Every day | ORAL | Status: DC
  Administered 2020-01-10 – 2020-01-15 (×6): 25 mg via ORAL
  Filled 2020-01-10 (×6): qty 1

## 2020-01-10 NOTE — Progress Notes (Signed)
Physical Therapy Treatment Patient Details Name: Adam Ray MRN: [id number] DOB: 03-22-1956 Today's Date: 01/10/2020    History of Present Illness 64 y.o.male who presents with osteomyelitis abscess right foot. Patient s/p transtibial amputation and wound vac placement. Significant PMH: CKD, COPD, DM type 2.     PT Comments    Pt very motivated but often tries to do too much.  Pt required cues for pacing.  He continues to be an excellent  candidate for aggressive rehab at [facility].     Follow Up Recommendations  CIR     Equipment Recommendations  Rolling walker with 5" wheels;3in1 (PT);Wheelchair (measurements PT);Wheelchair cushion (measurements PT);Other (comment)    Recommendations for Other Services       Precautions / Restrictions Precautions Precautions: Fall;Other (comment) Precaution Comments: wound vac Restrictions Weight Bearing Restrictions: Yes RLE Weight Bearing: Non weight bearing    Mobility  Bed Mobility               General bed mobility comments: in recliner  Transfers Overall transfer level: Needs assistance Equipment used: Rolling walker (2 wheeled) Transfers: Sit to/from Stand Sit to Stand: Min assist         General transfer comment: MinA to power up to stand and initially steady from recliner.  Ambulation/Gait Ambulation/Gait assistance: Min assist;Mod assist Gait Distance (Feet): 70 Feet Assistive device: Rolling walker (2 wheeled) Gait Pattern/deviations: Step-to pattern;Decreased step length - left;Antalgic;Trunk flexed (hop to pattern.) Gait velocity: decreased   General Gait Details: required mod assistance as he began to fatigue.   Stairs             Wheelchair Mobility    Modified Rankin (Stroke Patients Only)       Balance Overall balance assessment: Needs assistance Sitting-balance support: Feet supported Sitting balance-Leahy Scale: Good       Standing balance-Leahy Scale: Poor                               Cognition Arousal/Alertness: Awake/alert Behavior During Therapy: WFL for tasks assessed/performed Overall Cognitive Status: Within Functional Limits for tasks assessed                                 General Comments: WFL for basic mobility      Exercises General Exercises - Lower Extremity Ankle Circles/Pumps: Left;20 reps;Supine;AROM Quad Sets: AROM;Both;10 reps;Supine Heel Slides: AROM;10 reps;Supine;Left Hip ABduction/ADduction: AROM;Both;10 reps;Supine Straight Leg Raises: AROM;Both;10 reps;Supine Amputee Exercises Hip Extension: AROM;Right;10 reps;Standing Knee Flexion: AROM;Right;10 reps;Supine    General Comments        Pertinent Vitals/Pain Pain Assessment: Faces Faces Pain Scale: Hurts even more Pain Location: R residual limb Pain Descriptors / Indicators: Discomfort;Guarding;Grimacing Pain Intervention(s): Monitored during session;Repositioned    Home Living                      Prior Function            PT Goals (current goals can now be found in the care plan section) Acute Rehab PT Goals Patient Stated Goal: go to CIR Potential to Achieve Goals: Good Progress towards PT goals: Progressing toward goals    Frequency    Min 3X/week      PT Plan Current plan remains appropriate    Co-evaluation              AM-PAC  PT "6 Clicks" Mobility   Outcome Measure  Help needed turning from your back to your side while in a flat bed without using bedrails?: None Help needed moving from lying on your back to sitting on the side of a flat bed without using bedrails?: A Little Help needed moving to and from a bed to a chair (including a wheelchair)?: A Little Help needed standing up from a chair using your arms (e.g., wheelchair or bedside chair)?: A Little Help needed to walk in hospital room?: A Little Help needed climbing 3-5 steps with a railing? : A Little 6 Click Score: 19    End of Session Equipment  Utilized During Treatment: Gait belt Activity Tolerance: Patient tolerated treatment well Patient left: in chair;with call bell/phone within reach;with chair alarm set;with family/visitor present Nurse Communication: Mobility status PT Visit Diagnosis: Pain;Difficulty in walking, not elsewhere classified (R26.2);Unsteadiness on feet (R26.81) Pain - Right/Left: Right     Time: 7672-0947 PT Time Calculation (min) (ACUTE ONLY): 23 min  Charges:  $Gait Training: 8-22 mins $Therapeutic Exercise: 8-22 mins                     Erasmo Leventhal , PTA Acute Rehabilitation Services Pager [phone] Office [phone]     Nyellie Yetter Eli Hose 01/10/2020, 6:15 PM

## 2020-01-10 NOTE — Progress Notes (Signed)
Inpatient Rehabilitation Admissions Coordinator  I continue to await [facility] decision on my request for a possible CIR admit. I met with patient and his wife at bedside who continue to state [facility] and his other insurance is [facility] which I have verified is no longer active. I met with patient and wife as he contacted the [place] ED 72 hour notification line to request authorization for CIR. Patient and his wife have contacted his [place] PCP for assistance. I requested they ask his PCP to call me to further discuss.  Danne Baxter, RN, MSN Rehab Admissions Coordinator [phone] 01/10/2020 5:02 PM

## 2020-01-10 NOTE — Progress Notes (Signed)
Inpatient Diabetes Program Recommendations  AACE/ADA: New Consensus Statement on Inpatient Glycemic Control (2015)  Target Ranges:  Prepandial:   less than 140 mg/dL      Peak postprandial:   less than 180 mg/dL (1-2 hours)      Critically ill patients:  140 - 180 mg/dL   Lab Results  Component Value Date   GLUCAP 215 (H) 01/10/2020   HGBA1C 7.2 (A) 10/28/2019    Review of Glycemic Control Results for Adam Ray, Adam Ray (MRN [id number]) as of 01/10/2020 11:36  Ref. Range 01/09/2020 12:16 01/09/2020 16:41 01/09/2020 20:26 01/10/2020 08:05  Glucose-Capillary Latest Ref Range: 70 - 99 mg/dL 257 (H) 233 (H) 257 (H) 215 (H)   Diabetes history: DM 2 Home DM Meds:Lantus 80 units BID Novolog 40 units BID Jardiance 12.5 mg Daily Current Orders:Lantus 60 units once daily Novolog Sensitive Correction Scale/ SSI (0-9 units) TID [facility]   Inpatient Diabetes Program Recommendations:   Consider adding Novolog meal coverage to 5 units tid with meals (assuming patient is consuming >50% of meal).   Thanks, Bronson Curb, MSN, RNC-OB Diabetes Coordinator [phone] (8a-5p)

## 2020-01-10 NOTE — Progress Notes (Signed)
Family Medicine Teaching Service Daily Progress Note Intern Pager: [phone]  Patient name: Adam Ray Medical record number: [id number] Date of birth: 10-30-1955 Age: 64 y.o. Gender: male  Primary Care Provider: [name], MD Consultants: Orthopaedics and Pharmacy Code Status: Full Code  Pt Overview and Major Events to Date:  12/31/2019: admitted forLeg Swelling 01/02/2020: Seen by Ortho, recommend transtibial amputation 01/06/2020: Transtibial amputation with Dr. Sharol Given  Assessment and Plan: Adam Ray a 64 y.o.malewho presented w/ lower extremity edema and severe cellulitis. PMHx is significant for hypertension, CAD, PAD, HFpEF, insulin-dependent diabetes, CKD 2, osteoarthritis and history of alcohol abuse.   POD#4 S/p Right transtibial amputation Pain well controlled, denies any generalized or localized pain. Follow up with Orthopedic surgery in 1 week and they will discontinue wound vac at that time. Patient remains medically stable for discharge to SNF vs CIR.  - PT/OT: rolling walker with 5' wheels, wheelchair, wheelchair cushion  - f/u ortho ~7/16 - pain control PRN: acetaminophen, robaxin, oxy IR -Continue to work with social work to see status of SNF vs. CIR  Type 2 Diabetes, non insulin dependent  Patient on 10 sliding scale insulin and 60 Lantus. Blood glucose levels from 01/09/2020 to currently are as follows 257, 233, 257, 215 and 234.  - sSSI - continue Lantus 60u daily  - continue rosuvastatin -Added home med jardiance 25 mg  -reassess after jardiance to see if changes need to be made to lantus  HTN- stable BP today is 116/60 and 115/68. Normotensive today thus far.  - continue lisinopril and metoprolol -continue to monitor vitals   CAD, PAD - continue ticagrelor, ASA, nitroglycerin PRN  HFpEF Stable. Appears euvolemic on exam.  - continue home meds  CKD II Creatinine 1.19, BUN 19 and GFR > 60 all within normal limits. Stable following  discontinuation of IV antibiotics.  - Continue to monitor BMP  FEN/GI: Heart carb modified PPx: Lovenox  Disposition: awaiting SNF vs CIR, medically stable  Subjective:  Patient seen and examined at bedside, wife was also in the room and present for the entire encounter. He denies any chest pain, dyspnea, nausea, vomiting, dizziness, generalized or localized pain. He had no overnight events.   Objective: Temp:  [98 F (36.7 C)-98.3 F (36.8 C)] 98.3 F (36.8 C) (07/12 0626) Pulse Rate:  [53-57] 53 (07/12 0626) Resp:  [17-18] 17 (07/12 0626) BP: (106-116)/(58-61) 116/60 (07/12 0626) SpO2:  [98 %-99 %] 98 % (07/12 0626) Physical Exam: General: Patient in no acute distress, pleasant.  Cardiovascular: regular rate and rhythm, no murmurs or gallops Respiratory: lungs clear to auscultation bilaterally  Abdomen: soft, nontender, nondistended, active bowel sounds present  Extremities: dorsalis pedis pulse intact on the left foot, left LE edema present below the ankle, right amputation below the knee (healing well) Psych: mood appropriate, no agitation noted   Laboratory: Recent Labs  Lab 01/05/20 0301 01/06/20 0109 01/07/20 0238  WBC 4.5 5.9 4.6  HGB 12.3* 10.7* 10.7*  HCT 37.9* 32.3* 33.2*  PLT 163 180 199   Recent Labs  Lab 01/06/20 0109 01/07/20 0238 01/10/20 0129  NA 134* 135 135  K 4.6 4.7 4.1  CL 101 104 103  CO2 25 25 26   BUN 24* 20 19  CREATININE 1.38* 1.20 1.19  CALCIUM 8.2* 8.1* 8.8*  GLUCOSE 243* 186* 208*      Imaging/Diagnostic Tests: No results found.  Donney Dice, DO 01/10/2020, 6:29 AM PGY-1, [place] Intern pager: [phone], text  pages welcome

## 2020-01-10 NOTE — Progress Notes (Signed)
OT Cancellation Note  Patient Details Name: Adam Ray MRN: [id number] DOB: 1956-05-30   Cancelled Treatment:    Reason Eval/Treat Not Completed: Patient declined, no reason specified;Other (comment) (Pt on phone with insurance company >30 mins.)   OT checking on pt multiple times and pt continues to be on the phone. OT to continue to follow for treatment at next available treatment time.  Jefferey Pica, OTR/L Acute Rehabilitation Services Pager: [phone] Office: [phone]   Jefferey Pica 01/10/2020, 12:59 PM

## 2020-01-10 NOTE — Telephone Encounter (Signed)
Noted. I will try to go see patient in the hospital today.  [name], MD

## 2020-01-11 ENCOUNTER — Ambulatory Visit: Payer: No Typology Code available for payment source | Admitting: Family Medicine

## 2020-01-11 ENCOUNTER — Telehealth (HOSPITAL_COMMUNITY): Payer: Self-pay

## 2020-01-11 LAB — GLUCOSE, CAPILLARY
Glucose-Capillary: 134 mg/dL — ABNORMAL HIGH (ref 70–99)
Glucose-Capillary: 165 mg/dL — ABNORMAL HIGH (ref 70–99)
Glucose-Capillary: 150 mg/dL — ABNORMAL HIGH (ref 70–99)
Glucose-Capillary: 141 mg/dL — ABNORMAL HIGH (ref 70–99)

## 2020-01-11 NOTE — Telephone Encounter (Signed)
Please advise, thank you.

## 2020-01-11 NOTE — Progress Notes (Signed)
Inpatient Rehabilitation Admissions Coordinator  Notified by [facility] team that patient is requesting d/c/ home. We will sign off at this time.  Danne Baxter, RN, MSN Rehab Admissions Coordinator [phone] 01/11/2020 4:14 PM

## 2020-01-11 NOTE — Care Management (Addendum)
Received a message from [place] at [place] to call her back. 1 [phone], no ext given. Called number back.  Spoke to [place] , [place] updated.    [facility] did call [facility] last week and speak with [facility] SW and fax notes.    Melissa , patient's PCP 's nurse would like clinicals refaxed to [phone]. Same done.       [place] back to room with SW to discuss disposition. Patient now wants to go home with home health and wheel chair, 3 in 1 , walker and 2 ramps ( 2 steps each 6 inch).   Patient's home address is [place] [address] .   Patient wants DME and home health through the [place]. Provided patient with list of home health agencies. Explained if he has a preference NCM will let the [facility] know. Patient wants NCM to come back tomorrow for his preference. NCM will follow up with patient tomorrow. NCM explained to patient that NCM will call Melissa ( DR Cherlynn Kaiser at [facility]) and update her with patient's plan. Patient voiced understanding.   NCM spoke with Melissa at [place] . Melissa did receive fax from this morning. Melissa will update Dr Cherlynn Kaiser and [place] SW and call NCM back with what is needed to arrange home health and DME. Await call back.    Magdalen Spatz RN

## 2020-01-11 NOTE — Telephone Encounter (Signed)
Patient called in wanting to speak with dr duda . Says he needs to be seen In hospital again.

## 2020-01-11 NOTE — Plan of Care (Signed)
  Problem: Clinical Measurements: Goal: Ability to maintain clinical measurements within normal limits will improve Outcome: Progressing   Problem: Pain Managment: Goal: General experience of comfort will improve Outcome: Progressing   

## 2020-01-11 NOTE — Progress Notes (Signed)
Occupational Therapy Treatment Patient Details Name: Adam Ray MRN: [id number] DOB: [birth year] Today's Date: 01/11/2020    History of present illness 64 y.o.male who presents with osteomyelitis abscess right foot. Patient s/p transtibial amputation and wound vac placement. Significant PMH: CKD, COPD, DM type 2.    OT comments  Pt progressing well. VSS on RA. Pt standing at sink for ~5-7 mins for light grooming tasks with fair to poor standing balance requiring counter to lean on for stability. 1 LOB episode and pt using righting reactions to avoid total LOB with minguardA. Pt education on residual limb to decrease phantom limb pain by massage, looking at limb and paying attention to it. Pt would benefit from continued OT skilled services for ADL, mobility and safety. OT following acutely.   Follow Up Recommendations  CIR    Equipment Recommendations  3 in 1 bedside commode;Tub/shower bench;Wheelchair (measurements OT)    Recommendations for Other Services      Precautions / Restrictions Precautions Precautions: Fall;Other (comment) Precaution Comments: wound vac Restrictions Weight Bearing Restrictions: Yes RLE Weight Bearing: Non weight bearing       Mobility Bed Mobility               General bed mobility comments: in recliner pre and post session  Transfers Overall transfer level: Needs assistance Equipment used: Rolling walker (2 wheeled) Transfers: Sit to/from [facility] Sit to Stand: Min guard Stand pivot transfers: Min guard            Balance Overall balance assessment: Needs assistance Sitting-balance support: Feet supported Sitting balance-Leahy Scale: Good     Standing balance support: Single extremity supported;Bilateral upper extremity supported;During functional activity Standing balance-Leahy Scale: Poor Standing balance comment: RW for support or sink                           ADL either performed or assessed  with clinical judgement   ADL Overall ADL's : Needs assistance/impaired     Grooming: Supervision/safety;Standing Grooming Details (indicate cue type and reason): x4 mins at sink leaning on counter, single UE required for stability             Lower Body Dressing: Minimal assistance;Sitting/lateral leans Lower Body Dressing Details (indicate cue type and reason): minA to doff sleeve/sock and limb protector Toilet Transfer: Min guard;Ambulation;RW Toilet Transfer Details (indicate cue type and reason): hopping to and from bathroom Toileting- Clothing Manipulation and Hygiene: Minimal assistance;Sitting/lateral lean;Sit to/from stand       Functional mobility during ADLs: Min guard;Cueing for safety;Rolling walker General ADL Comments: Pt limited by decreased activity tolerance and ability to conserve energy. Pt with LOB episode with ambulationg "hopping" over hump to bathroom. Pt able to use righting reactions      Vision   Vision Assessment?: No apparent visual deficits   Perception     Praxis      Cognition Arousal/Alertness: Awake/alert Behavior During Therapy: WFL for tasks assessed/performed Overall Cognitive Status: Within Functional Limits for tasks assessed                                          Exercises     Shoulder Instructions       General Comments VSS on RA. Pt education on residual limb to decrease phanotm limb pain by massage, looking at limb and paying attention  to it.    Pertinent Vitals/ Pain       Pain Assessment: No/denies pain  Home Living                                          Prior Functioning/Environment              Frequency  Min 2X/week        Progress Toward Goals  OT Goals(current goals can now be found in the care plan section)  Progress towards OT goals: Progressing toward goals  Acute Rehab OT Goals Patient Stated Goal: go to CIR OT Goal Formulation: With  patient/family Time For Goal Achievement: 01/20/20 Potential to Achieve Goals: Good ADL Goals Pt Will Perform Lower Body Dressing: with min guard assist;with min assist;with adaptive equipment;sitting/lateral leans;sit to/from stand Pt Will Transfer to Toilet: with supervision;ambulating;bedside commode Pt Will Perform Toileting - Clothing Manipulation and hygiene: with min guard assist;with min assist;sitting/lateral leans;sit to/from stand;with adaptive equipment  Plan Discharge plan remains appropriate    Co-evaluation                 AM-PAC OT "6 Clicks" Daily Activity     Outcome Measure   Help from another person eating meals?: None Help from another person taking care of personal grooming?: A Little Help from another person toileting, which includes using toliet, bedpan, or urinal?: A Little Help from another person bathing (including washing, rinsing, drying)?: A Little Help from another person to put on and taking off regular upper body clothing?: None Help from another person to put on and taking off regular lower body clothing?: A Little 6 Click Score: 20    End of Session Equipment Utilized During Treatment: Rolling walker  OT Visit Diagnosis: Unsteadiness on feet (R26.81);Other abnormalities of gait and mobility (R26.89);Pain Pain - Right/Left: Right Pain - part of body: Leg   Activity Tolerance Patient tolerated treatment well   Patient Left in chair;with call bell/phone within reach;with chair alarm set   Nurse Communication Mobility status        Time: 7939-0300 OT Time Calculation (min): 18 min  Charges: OT General Charges $OT Visit: 1 Visit OT Treatments $Self Care/Home Management : 8-22 mins  Jefferey Pica, OTR/L Acute Rehabilitation Services Pager: [phone] Office: [phone]    [name] C 01/11/2020, 6:09 PM

## 2020-01-11 NOTE — Progress Notes (Signed)
Family Medicine Teaching Service Daily Progress Note Intern Pager: [phone]  Patient name: Adam Ray Medical record number: [id number] Date of birth: 19-Nov-1955 Age: 64 y.o. Gender: male  Primary Care Provider: [name], MD Consultants: Orthopaedics and Pharmacy Code Status: Full Code  Pt Overview and Major Events to Date:  12/31/2019: admitted forLeg Swelling 01/02/2020: Seen by Ortho, recommend transtibial amputation 01/06/2020: Transtibial amputation with Dr. Sharol Given  Assessment and Plan:  Adam Ray a 64 y.o.malewho presented w/ lower extremity edema and severe cellulitis. PMHx is significant for hypertension, CAD, PAD, HFpEF, insulin-dependent diabetes, CKD 2, osteoarthritis and history of alcohol abuse.  POD#4S/p Righttranstibial amputation Pain well controlled, denies any generalized or localized pain. Follow up with Orthopedic surgery in 1 week and they will discontinue wound vac at that time. Patient remains medically stable for discharge to SNF vs CIR.  - PT/OT: rolling walker with 5' wheels, wheelchair, wheelchair cushion  - f/u ortho ~7/16 - pain control PRN: acetaminophen, robaxin, oxy IR -Continue to work with social work to see status of SNF vs. CIR  Type 2 Diabetes, non insulin dependent  Patient on10sliding scale insulin and 60 Lantus. Blood glucose levels from 01/09/2020 to 01/10/2020 were as follows 257, 233, 257, 215 and 234. Most recent CBG 205 from last night 01/10/2020. CBGs are continuing to improve.  - sSSI - continue Lantus 60u daily - continue rosuvastatin -Continue home med jardiance 25 mg  -Continue to monitor   HTN- stable BP today is 113/66. Normotensive today thus far.  - continue lisinopril and metoprolol -continue to monitor vitals   CAD, PAD - continue ticagrelor, ASA, nitroglycerin PRN  HFpEF Stable. Appears euvolemic on exam. - continue home meds  CKD II Most recent creatinine 1.19, BUN 19 and GFR > 60 from  yesterday all within normal limits.  Stable following discontinuation of IV antibiotics. - Continue to monitor BMP   FEN/GI: Heart carb modified PPx: Lovenox  Disposition: awaiting SNF vs CIR, medically stable  Subjective:  Patient seen and examined at bedside. He denies any chest pain, dyspnea, nausea, vomiting and pain. Patient had no overnight events and has no new complaints this morning.  Objective: Temp:  [98.2 F (36.8 C)-99 F (37.2 C)] 98.2 F (36.8 C) (07/13 0522) Pulse Rate:  [55-63] 55 (07/13 0522) Resp:  [16-18] 17 (07/13 0522) BP: (101-115)/(62-68) 113/66 (07/13 0522) SpO2:  [99 %] 99 % (07/13 0522) Physical Exam: General: Patient in no acute distress, resting comfortably in bed. Cardiovascular: regular rate and rhythm, no murmurs or gallops noted Respiratory: lungs clear to auscultation bilaterally  Abdomen: soft, nontender on palpation, nondistended, active bowel sounds Extremities: dorsalis pedis pulse intact on the left, left LE edema present below the ankle but improved from yesterday, right amputation below the knee, continuing to heal well Psych: mood appropriate, no agitation noted   Laboratory: Recent Labs  Lab 01/05/20 0301 01/06/20 0109 01/07/20 0238  WBC 4.5 5.9 4.6  HGB 12.3* 10.7* 10.7*  HCT 37.9* 32.3* 33.2*  PLT 163 180 199   Recent Labs  Lab 01/06/20 0109 01/07/20 0238 01/10/20 0129  NA 134* 135 135  K 4.6 4.7 4.1  CL 101 104 103  CO2 25 25 26   BUN 24* 20 19  CREATININE 1.38* 1.20 1.19  CALCIUM 8.2* 8.1* 8.8*  GLUCOSE 243* 186* 208*      Imaging/Diagnostic Tests: No results found.  Donney Dice, DO 01/11/2020, 7:25 AM PGY-1, [place] Intern pager: [phone], text pages  welcome

## 2020-01-11 NOTE — Discharge Instructions (Addendum)
Dear Adam Ray,  Thank you for letting us participate in your care. You were hospitalized for Cellulitis of right foot and abscess, requiring amputation.  We have substantially decreased your insulin while you are here due to your sugars getting low.  Please see the medication list for your decreased amount of Lantus.  Please make sure you continue to monitor your sugar at home and bring in a journal on your hospital follow-up in case we need to titrate this medicine.  POST-HOSPITAL & CARE INSTRUCTIONS 1. Please stop taking nabumetone at discharge.  2. You will need to follow up with orthopedics this week.  2. Go to your follow up appointments (listed below)  DOCTOR'S APPOINTMENT   Future Appointments  Date Time Provider [place]  01/19/2020  9:00 AM Delora Fuel, MD [facility]  02/01/2020  9:10 AM [name], MD [facility]  02/08/2020  2:00 PM Lorretta Harp, MD [facility] [place], NP Follow up in 1 week(s).   Specialty: Orthopedic Surgery Contact information: [place] [address] [phone]        [place]. Go on 01/19/2020.   Why: at 9am with Dr. Manus Rudd for hospital follow up. You also have an appt with your PCP at the beginning of August.  Contact information: [place]              Take care and be well!  [facility]  [place], [place] [address] [phone]

## 2020-01-11 NOTE — Plan of Care (Signed)

## 2020-01-11 NOTE — Telephone Encounter (Signed)
We will see on rounds in the am

## 2020-01-12 LAB — BASIC METABOLIC PANEL
Anion gap: 12 (ref 5–15)
BUN: 24 mg/dL — ABNORMAL HIGH (ref 8–23)
CO2: 24 mmol/L (ref 22–32)
Calcium: 8.9 mg/dL (ref 8.9–10.3)
Chloride: 101 mmol/L (ref 98–111)
Creatinine, Ser: 1.37 mg/dL — ABNORMAL HIGH (ref 0.61–1.24)
GFR calc Af Amer: >60 mL/min (ref >60)
GFR calc non Af Amer: 54 mL/min — ABNORMAL LOW (ref >60)
Glucose, Bld: 65 mg/dL — ABNORMAL LOW (ref 70–99)
Potassium: 4 mmol/L (ref 3.5–5.1)
Sodium: 137 mmol/L (ref 135–145)

## 2020-01-12 LAB — CBC
HCT: 34.9 % — ABNORMAL LOW (ref 39.0–52.0)
Hemoglobin: 11.3 g/dL — ABNORMAL LOW (ref 13.0–17.0)
MCH: 29 pg (ref 26.0–34.0)
MCHC: 32.4 g/dL (ref 30.0–36.0)
MCV: 89.5 fL (ref 80.0–100.0)
Platelets: 263 10*3/uL (ref 150–400)
RBC: 3.9 MIL/uL — ABNORMAL LOW (ref 4.22–5.81)
RDW: 12.7 % (ref 11.5–15.5)
WBC: 4.5 10*3/uL (ref 4.0–10.5)
nRBC: 0 % (ref 0.0–0.2)

## 2020-01-12 LAB — GLUCOSE, CAPILLARY
Glucose-Capillary: 72 mg/dL (ref 70–99)
Glucose-Capillary: 171 mg/dL — ABNORMAL HIGH (ref 70–99)
Glucose-Capillary: 133 mg/dL — ABNORMAL HIGH (ref 70–99)
Glucose-Capillary: 230 mg/dL — ABNORMAL HIGH (ref 70–99)

## 2020-01-12 MED ORDER — INSULIN GLARGINE 100 UNIT/ML ~~LOC~~ SOLN
50.0000 [IU] | Freq: Every day | SUBCUTANEOUS | Status: DC
  Administered 2020-01-12 – 2020-01-15 (×4): 50 [IU] via SUBCUTANEOUS
  Filled 2020-01-12 (×4): qty 0.5

## 2020-01-12 NOTE — Plan of Care (Signed)

## 2020-01-12 NOTE — TOC Progression Note (Addendum)
Transition of Care [facility]) - Progression Note    Patient Details  Name: Adam Ray MRN: [id number] Date of Birth: 06/30/1956  Transition of Care [facility] CM/SW Contact  Jacalyn Lefevre Edson Snowball, RN Phone Number: 01/12/2020, 1:02 PM  Clinical Narrative:     Spoke to patient at bedside , wife was not present.   Patient does want to go home with home health services and DME( wheel chair, walker, 3 in 1 and two ramps) through the [place]. Patient has no decided on preference for home health . [facility] can give [facility] preference , they will arrange home health agency and call patient directly with agency name.   [facility] will arrange D[facility].   Spoke to patient's [place]. Patient's [facility] PCP DR Cherlynn Kaiser is at lunch and has not signed off on home health or DME.   NCM explained patient is ready for discharge. [name] aware and has sent message to Dr Kathrin Ruddy nurse Lenna Sciara.   [name] will email NCM DME form to complete and have [facility] MD sign and return.    1415 Completed Urgent Needs for Discharge form for DME through [place] and fax form back to the [place] at [phone]. Called [place] SW back and left message form returned and MD ready to discharge.   Also called Dr Cherlynn Kaiser nurse Lenna Sciara and left message awaiting return call.  Also faxed updated clinicals and Urgent Items for Discharge form to [place] ( Dr Kathrin Ruddy nurse at [facility] ).    [place] at [place] back again and left another message.  Melissa with [facility] returned call. Confirmed she received all needed forms/ clinicals from NCM. PCP has approved DME and HHRN,PT and aide. Melissa will call tomorrow and check on status of DME delivery and call NCM and patient. Expected Discharge Plan and Services     Discharge Planning Services: CM Consult   Living arrangements for the past 2 months: Single Family Home                                       Social Determinants of Health (SDOH) Interventions    Readmission Risk  Interventions No flowsheet data found.

## 2020-01-12 NOTE — Progress Notes (Signed)
Physical Therapy Treatment Patient Details Name: Adam Ray MRN: [id number] DOB: [birth year] Today's Date: 01/12/2020    History of Present Illness 64 y.o.male who presents with osteomyelitis abscess right foot. Patient s/p transtibial amputation and wound vac placement. Significant PMH: CKD, COPD, DM type 2.     PT Comments    Pt reclined this session in recliner.  Pt required assistance to move into standing and advance gt with RW.  He continues to require cues to energy conservation as he often pushes him self too far with RW.   He continues to benefit from rehab in a post acute setting but he is now refusing placement and will require HHPT.    Follow Up Recommendations  CIR     Equipment Recommendations  Rolling walker with 5" wheels;3in1 (PT);Wheelchair (measurements PT);Wheelchair cushion (measurements PT);Other (comment)    Recommendations for Other Services       Precautions / Restrictions Precautions Precautions: Fall;Other (comment) Precaution Comments: wound vac Restrictions Weight Bearing Restrictions: Yes RLE Weight Bearing: Non weight bearing    Mobility  Bed Mobility               General bed mobility comments: in recliner pre and post session  Transfers Overall transfer level: Needs assistance Equipment used: Rolling walker (2 wheeled) Transfers: Sit to/from Stand Sit to Stand: Min guard         General transfer comment: min guard to stand from recliner and bench in hall.  Ambulation/Gait Ambulation/Gait assistance: Min guard Gait Distance (Feet): 60 Feet (x2) Assistive device: Rolling walker (2 wheeled) Gait Pattern/deviations: Step-to pattern;Decreased step length - left;Antalgic;Trunk flexed Gait velocity: decreased   General Gait Details: Pt continues to fatigue and end of gt training.  Pt required cues for RW safety, L foot clearance and sequencing.   Stairs             Wheelchair Mobility    Modified Rankin (Stroke  Patients Only)       Balance Overall balance assessment: Needs assistance Sitting-balance support: Feet supported Sitting balance-Leahy Scale: Good     Standing balance support: Single extremity supported;Bilateral upper extremity supported;During functional activity                                Cognition Arousal/Alertness: Awake/alert Behavior During Therapy: WFL for tasks assessed/performed Overall Cognitive Status: Within Functional Limits for tasks assessed                                 General Comments: WFL for basic mobility      Exercises General Exercises - Lower Extremity Ankle Circles/Pumps: Left;20 reps;Supine;AROM Quad Sets: AROM;Both;10 reps;Supine Gluteal Sets: AROM;Both;10 reps;Supine Hip ABduction/ADduction: AROM;Both;10 reps;Supine Straight Leg Raises: AROM;Both;10 reps;Supine Amputee Exercises Knee Flexion: AROM;10 reps;Seated;Right Knee Extension: Right;10 reps;Seated;AROM    General Comments        Pertinent Vitals/Pain Pain Assessment: No/denies pain Faces Pain Scale: Hurts even more Pain Location: R residual limb Pain Descriptors / Indicators: Discomfort;Guarding;Grimacing Pain Intervention(s): Monitored during session;Repositioned    Home Living                      Prior Function            PT Goals (current goals can now be found in the care plan section) Acute Rehab PT Goals Patient Stated Goal: go to  CIR Potential to Achieve Goals: Good Progress towards PT goals: Progressing toward goals    Frequency    Min 3X/week      PT Plan Current plan remains appropriate    Co-evaluation              AM-PAC PT "6 Clicks" Mobility   Outcome Measure  Help needed turning from your back to your side while in a flat bed without using bedrails?: A Little Help needed moving from lying on your back to sitting on the side of a flat bed without using bedrails?: A Little Help needed moving to  and from a bed to a chair (including a wheelchair)?: A Little Help needed standing up from a chair using your arms (e.g., wheelchair or bedside chair)?: A Little Help needed to walk in hospital room?: A Little Help needed climbing 3-5 steps with a railing? : A Little 6 Click Score: 18    End of Session Equipment Utilized During Treatment: Gait belt Activity Tolerance: Patient tolerated treatment well Patient left: in chair;with call bell/phone within reach;with chair alarm set;with family/visitor present Nurse Communication: Mobility status PT Visit Diagnosis: Pain;Difficulty in walking, not elsewhere classified (R26.2);Unsteadiness on feet (R26.81) Pain - Right/Left: Right Pain - part of body:  (residual limb)     Time: 6629-4765 PT Time Calculation (min) (ACUTE ONLY): 26 min  Charges:  $Gait Training: 8-22 mins $Therapeutic Exercise: 8-22 mins                     Erasmo Leventhal , PTA Acute Rehabilitation Services Pager [phone] Office [phone]     Charell Faulk Eli Hose 01/12/2020, 5:11 PM

## 2020-01-12 NOTE — Progress Notes (Signed)
Family Medicine Teaching Service Daily Progress Note Intern Pager: [phone]  Patient name: Adam Ray Medical record number: [id number] Date of birth: 1956/05/03 Age: 64 y.o. Gender: male  Primary Care Provider: [name], MD Consultants: Orthopaedics and Pharmacy Code Status: Full Code  Pt Overview and Major Events to Date:  12/31/2019: admitted forLeg Swelling 01/02/2020: Seen by Ortho, recommend transtibial amputation 01/06/2020: Transtibial amputation with Dr. Sharol Given  Assessment and Plan: Adam Ray a 64 y.o.malewho presented w/ lower extremity edema and severe cellulitis. PMHxis significant forhypertension, CAD, PAD, HFpEF, insulin-dependent diabetes, CKD 2,osteoarthritis andhistory of alcohol abuse.  POD#4S/p Righttranstibial amputation Pain well controlled, denies any generalized or localized pain.Follow upwith Orthopedic surgery later this week and they will discontinue wound vac at that time. Patient remains medically stable for discharge to home with home health. - PT/OT: rolling walker with 5' wheels, wheelchair, wheelchair cushion  - pain control PRN: acetaminophen, robaxin, oxy IR -Per case management, patient now wants to be discharged home with home health. May be discharged home with wheelchair, 3 in 1, walker and 2 ramps. Patient medically stable for discharge.  -Follow up with ortho ~7/16 to discontinue wound vac -Social work has discussed transportation home with patient   Type 2 Diabetes, non insulin dependent  Patienton10sliding scale insulin and 60 Lantus.Blood glucose levels from 01/09/2020 to 01/10/2020 were as follows 257, 233, 257, 215 and 234.CBG 205 from 01/10/2020. CBG this morning was 70 and Lantus was lowered.  - sSSI - Lantus 50u daily - continue rosuvastatin -Continue home med jardiance 25 mg -Continue to monitor  -Due to medication change so close to discharge, follow up with PCP to check on current insulin regimen and BMP  to assess renal function.  HTN- stable BP today is110/63.Normotensivetoday thus far.  - continue lisinoprilandmetoprolol -continue to monitor vitals  CAD, PAD - continue ticagrelor, ASA, nitroglycerin PRN  HFpEF Stable.Appears euvolemic on exam. - continue home meds  CKD II Stable following discontinuation of IV antibiotics.Cr 1.37, BUN 24 and GFR >60. -Continue to monitor BMP   FEN/GI: Heart carb modified PPx: Lovenox  Disposition: potentially to be discharged home today  Subjective:  Patient was seen and examined at bedside. He denies any chest pain, dyspnea, nausea, vomiting and dizziness. When I was in there, he wanted to transition from the bed to the chair which was successful with the nurse's assistance. Patient denies any vision changes or dizziness upon standing and with movements. He says that he is ready to be discharged home today.   Objective: Temp:  [98.2 F (36.8 C)-98.4 F (36.9 C)] 98.2 F (36.8 C) (07/14 0432) Pulse Rate:  [57-64] 57 (07/14 0432) Resp:  [16-17] 16 (07/14 0432) BP: (107-123)/(60-69) 110/63 (07/14 0432) SpO2:  [99 %-100 %] 100 % (07/14 0432) Physical Exam: General: Patient in no acute distress, ready to go home Cardiovascular: regular rate and rhythm, no murmurs or gallops noted  Respiratory: lungs clear to auscultation bilaterally, no increased work of breathing noted Abdomen: soft, nontender on palpation, active bowel sounds  Extremities: distal pulse intact on the left, left LE edema below the ankle has improved since yesterday but still present, right amputation below the knee Psych: mood appropriate, no agitation   Laboratory: Recent Labs  Lab 01/06/20 0109 01/07/20 0238 01/12/20 0401  WBC 5.9 4.6 4.5  HGB 10.7* 10.7* 11.3*  HCT 32.3* 33.2* 34.9*  PLT 180 199 263   Recent Labs  Lab 01/07/20 0238 01/10/20 0129 01/12/20 0401  NA 135 135  137  K 4.7 4.1 4.0  CL 104 103 101  CO2 25 26 24   BUN 20 19 24*   CREATININE 1.20 1.19 1.37*  CALCIUM 8.1* 8.8* 8.9  GLUCOSE 186* 208* 65*      Imaging/Diagnostic Tests: No results found.  Donney Dice, DO 01/12/2020, 7:17 AM PGY-1, [place] Intern pager: [phone], text pages welcome

## 2020-01-12 NOTE — TOC Progression Note (Signed)
Transition of Care [facility]) - Progression Note    Patient Details  Name: Adam Ray MRN: [id number] Date of Birth: 25-Jan-1956  Transition of Care [facility]) CM/SW [place], [place] Phone Number: 01/12/2020, 8:09 AM  Clinical Narrative:    CSW met with pt at bedside on Tuesday afternoon to discuss [facility] transportation to physician appointments and grocery store.  Information will be faxed to the [place] office.  Pt was given information on how to follow up with [facility] for transportation.  Pt acknowledges understanding        Expected Discharge Plan and Services     Discharge Planning Services: CM Consult   Living arrangements for the past 2 months: Single Family Home                                       Social Determinants of Health (SDOH) Interventions    Readmission Risk Interventions No flowsheet data found.

## 2020-01-12 NOTE — Progress Notes (Signed)
Patient is 1 week status post transtibial amputation.  He is doing well.  He is anticipating discharge now to home with home health.  He is worried about the ability to be able to change the dressing himself and getting around his house safely  Vital signs stable afebrile wound is VAC is currently in place no new drainage in canister   From an orthopedic standpoint patient could be discharged with home health physical therapy and nursing to assess his wound and help with the dressing changes he is unable to do this himself and this is a complex amputation stump wound.  I have put in a consult for transition of care as well as home health order and a DME face-to-face for a wheelchair.  Patient will need follow-up in our office in 1 week.  Wound VAC will be removed today.  Dressing will be applied wound VAC will be removed today

## 2020-01-13 LAB — CBC
HCT: 35.3 % — ABNORMAL LOW (ref 39.0–52.0)
Hemoglobin: 11.7 g/dL — ABNORMAL LOW (ref 13.0–17.0)
MCH: 29.9 pg (ref 26.0–34.0)
MCHC: 33.1 g/dL (ref 30.0–36.0)
MCV: 90.3 fL (ref 80.0–100.0)
Platelets: 240 10*3/uL (ref 150–400)
RBC: 3.91 MIL/uL — ABNORMAL LOW (ref 4.22–5.81)
RDW: 12.7 % (ref 11.5–15.5)
WBC: 3.2 10*3/uL — ABNORMAL LOW (ref 4.0–10.5)
nRBC: 0 % (ref 0.0–0.2)

## 2020-01-13 LAB — BASIC METABOLIC PANEL
Anion gap: 10 (ref 5–15)
Anion gap: 7 (ref 5–15)
BUN: 26 mg/dL — ABNORMAL HIGH (ref 8–23)
BUN: 27 mg/dL — ABNORMAL HIGH (ref 8–23)
CO2: 25 mmol/L (ref 22–32)
CO2: 26 mmol/L (ref 22–32)
Calcium: 8.9 mg/dL (ref 8.9–10.3)
Calcium: 8.9 mg/dL (ref 8.9–10.3)
Chloride: 101 mmol/L (ref 98–111)
Chloride: 101 mmol/L (ref 98–111)
Creatinine, Ser: 1.25 mg/dL — ABNORMAL HIGH (ref 0.61–1.24)
Creatinine, Ser: 1.34 mg/dL — ABNORMAL HIGH (ref 0.61–1.24)
GFR calc Af Amer: >60 mL/min (ref >60)
GFR calc Af Amer: >60 mL/min (ref >60)
GFR calc non Af Amer: 56 mL/min — ABNORMAL LOW (ref >60)
GFR calc non Af Amer: >60 mL/min (ref >60)
Glucose, Bld: 146 mg/dL — ABNORMAL HIGH (ref 70–99)
Glucose, Bld: 198 mg/dL — ABNORMAL HIGH (ref 70–99)
Potassium: 4.3 mmol/L (ref 3.5–5.1)
Potassium: 4.3 mmol/L (ref 3.5–5.1)
Sodium: 134 mmol/L — ABNORMAL LOW (ref 135–145)
Sodium: 136 mmol/L (ref 135–145)

## 2020-01-13 LAB — GLUCOSE, CAPILLARY
Glucose-Capillary: 131 mg/dL — ABNORMAL HIGH (ref 70–99)
Glucose-Capillary: 186 mg/dL — ABNORMAL HIGH (ref 70–99)
Glucose-Capillary: 159 mg/dL — ABNORMAL HIGH (ref 70–99)
Glucose-Capillary: 208 mg/dL — ABNORMAL HIGH (ref 70–99)

## 2020-01-13 NOTE — Progress Notes (Signed)
Family Medicine Teaching Service Daily Progress Note Intern Pager: [phone]  Patient name: Adam Ray Medical record number: [id number] Date of birth: 1956/03/03 Age: 64 y.o. Gender: male  Primary Care Provider: [name], MD Consultants: Orthopaedics and Pharmacy Code Status: Full Code  Pt Overview and Major Events to Date:  12/31/2019: admitted forLeg Swelling 01/02/2020: Seen by Ortho, recommend transtibial amputation 01/06/2020: Transtibial amputation with Dr. Sharol Given  Assessment and Plan: Adam Almond Foustis a 64 y.o.malewho presented w/ lower extremity edema and severe cellulitis. PMHxis significant forhypertension, CAD, PAD, HFpEF, insulin-dependent diabetes, CKD 2,osteoarthritis andhistory of alcohol abuse.   POD#4S/p Righttranstibial amputation Pain well controlled, denies any generalized or localized pain.Follow upwith Orthopedic surgery later this week and they will discontinue wound vac at that time. Patient remains medically stable for discharge to home with home health. - PT/OT: rolling walker with 5' wheels, wheelchair, wheelchair cushion  - pain control PRN: acetaminophen, robaxin, oxy IR -Per case management, patient now wants to be discharged home with home health. May be discharged home with wheelchair, 3 in 1, walker and 2 ramps. Patient medically stable for discharge.  -Follow up with ortho ~7/16 to discontinue wound vac -Social work has discussed transportation home with patient   Type 2 Diabetes, non insulin dependent  Patienton10sliding scale insulin and 60 Lantus.Blood glucose levels from 01/09/2020 to7/05/2020 wereas follows 257, 233, 257, 215 and 234.CBG 205 from 01/10/2020. CBG yesterday morning was 72 and Lantus was lowered. Other CBG values after decrease in Lantus were 171>133>230. - sSSI - Continue Lantus 50u daily - continue rosuvastatin  -Continuehome med jardiance 25 mg -Continue to monitor -Pharmacy had an extensive discussion  with the patient on his change in DM management during hospitalization and how this change continues after he goes home.  -Due to medication change so close to discharge, follow up with PCP to check on current insulin regimen and BMP to assess renal function.  HTN- stable BP today is109/65.Normotensivetoday thus far.  - continue lisinoprilandmetoprolol -continue to monitor vitals  CAD, PAD - continue ticagrelor, ASA, nitroglycerin PRN  HFpEF Stable.Appears euvolemic on exam. - continue home meds  CKD II Stable following discontinuation of IV antibiotics.Cr 1.34, BUN 26 and GFR >60. -Continue to monitor BMP  FEN/GI: Heart carb modified PPx: Lovenox  Disposition: likely discharged today with home health  Subjective:  Patient seen and examined at bedside, along with wife who was also in the room. He is eagerly waiting to be discharged home. He says that social work spoke with him yesterday regarding transportation. He denies any chest pain, dyspnea and dizziness. He has no new complaints today. Pharmacy had an extensive discussion with the patient on his change in DM management during hospitalization and how this change continues after he goes home. Patient and his wife understood and had no further questions.  Objective: Temp:  [97.4 F (36.3 C)-98.3 F (36.8 C)] 98.3 F (36.8 C) (07/15 0449) Pulse Rate:  [52-60] 52 (07/15 0449) Resp:  [15-17] 15 (07/15 0449) BP: (96-109)/(56-65) 109/65 (07/15 0449) SpO2:  [97 %-99 %] 97 % (07/15 0449) Physical Exam: General: Patient in no acute distress Cardiovascular: regular rate and rhythm, no murmurs or gallops  Respiratory: lungs clear to auscultation bilaterally Abdomen: soft, nontender, nondistended, active bowel sounds present  Extremities: distal pulse intact on the left, left LE edema below the ankle continues to improve each day, right amputation below the knee  Laboratory: Recent Labs  Lab 01/07/20 0238  01/12/20 0401 01/13/20 0357  WBC  4.6 4.5 3.2*  HGB 10.7* 11.3* 11.7*  HCT 33.2* 34.9* 35.3*  PLT 199 263 240   Recent Labs  Lab 01/12/20 0401 01/12/20 2356 01/13/20 0357  NA 137 134* 136  K 4.0 4.3 4.3  CL 101 101 101  CO2 24 26 25   BUN 24* 27* 26*  CREATININE 1.37* 1.25* 1.34*  CALCIUM 8.9 8.9 8.9  GLUCOSE 65* 198* 146*      Imaging/Diagnostic Tests: No results found.  Donney Dice, DO 01/13/2020, 9:23 AM PGY-1, [place] Intern pager: [phone], text pages welcome

## 2020-01-13 NOTE — TOC Progression Note (Addendum)
Transition of Care [facility]) - Progression Note    Patient Details  Name: Adam Ray MRN: [id number] Date of Birth: 01/06/1956  [facility]) CM/SW Contact  Jacalyn Lefevre Edson Snowball, RN Phone Number: 01/13/2020, 11:45 AM  Clinical Narrative:    Patient and wife asking when DME will be delivered. Again explained NCM confirmed [facility] received all required documentation for DME and home health services. VA will arrange and call patient. PAtient states he will not leave hospital until he receives DME. NCM explained this to patient's [place], PCP care nurse yesterday. Melissa stated she would call NCM back this morning regarding DME. NCM has not received call. Patient also has [facility] messages at [facility]   NCM left message for [place], regarding same.   NCM called [facility] and asked to speak to [facility]. Left message. Melissa called back. [place] will not be delivered prior to discharge. DME requires VA to do a telephone consult with patient before VA will authorized. Lenna Sciara will ask if phone consult can be done today. Melissa will call NCM back.       Expected Discharge Plan and Services     Discharge Planning Services: CM Consult   Living arrangements for the past 2 months: Single Family Home                                       Social Determinants of Health (SDOH) Interventions    Readmission Risk Interventions No flowsheet data found.

## 2020-01-14 ENCOUNTER — Inpatient Hospital Stay (HOSPITAL_COMMUNITY): Payer: No Typology Code available for payment source

## 2020-01-14 LAB — GLUCOSE, CAPILLARY
Glucose-Capillary: 87 mg/dL (ref 70–99)
Glucose-Capillary: 166 mg/dL — ABNORMAL HIGH (ref 70–99)
Glucose-Capillary: 161 mg/dL — ABNORMAL HIGH (ref 70–99)
Glucose-Capillary: 167 mg/dL — ABNORMAL HIGH (ref 70–99)

## 2020-01-14 MED ORDER — MELATONIN 3 MG PO TABS
3.0000 mg | ORAL_TABLET | Freq: Every day | ORAL | Status: DC
  Administered 2020-01-14: 3 mg via ORAL
  Filled 2020-01-14: qty 1

## 2020-01-14 NOTE — Progress Notes (Signed)
Family Medicine Teaching Service Daily Progress Note Intern Pager: [phone]  Patient name: Adam Ray Medical record number: [id number] Date of birth: 12-04-1955 Age: 64 y.o. Gender: male  Primary Care Provider: [name], MD Consultants: Orthopaedics and Pharmacy Code Status: Full Code  Pt Overview and Major Events to Date:  12/31/2019: admitted forLeg Swelling 01/02/2020: Seen by Ortho, recommend transtibial amputation 01/06/2020: Transtibial amputation with Dr. Sharol Given  Assessment and Plan: Adam Almond Foustis a 64 y.o.malewho presented w/ lower extremity edema and severe cellulitis. PMHxis significant forhypertension, CAD, PAD, HFpEF, insulin-dependent diabetes, CKD 2,osteoarthritis andhistory of alcohol abuse.  POD#4S/p Righttranstibial amputation Pain well controlled, denies any generalized or localized pain.Follow upwith Orthopedic surgery after dischargeand they will discontinue wound vac at that time. Patient remains medically stable for discharge to home with home health. - PT/OT: rolling walker with 5' wheels, wheelchair, wheelchair cushion  - pain control PRN: acetaminophen, robaxin, oxy IR -Per case management, patient now wants to be discharged home with home health. May be discharged home with wheelchair, 3 in 1, walker and 2 ramps. Patient medically stable for discharge. -Follow up with orthoafter discharge to discontinue wound vac -Social work has discussed transportation home with patient -Awaiting DME  Type 2 Diabetes, non insulin dependent  Blood glucose levels from 01/09/2020 to7/05/2020 wereas follows 257, 233, 257, 215 and 234.CBG trend 7/15 131>186>159. CBG 208 today. - Continue sSSI - Continue Lantus50u daily - continue rosuvastatin  -Continuehome med jardiance 25 mg -Continue to monitor -Pharmacy had an extensive discussion on 01/13/2020 with the patient on his change in DM management during hospitalization and how this change  continues after he goes home.  -Due to medication change so close to discharge, follow up with PCP to check on current insulin regimen and BMP to assess renal function.  HTN- stable BP today is111/60.Normotensivetoday thus far.  - continue lisinoprilandmetoprolol -continue to monitor vitals -Follow up with PCP  CAD, PAD - continue ticagrelor, ASA, nitroglycerin PRN  HFpEF Stable.Appears euvolemic on exam. - continue home meds  CKD II Stable following discontinuation of IV antibiotics.Cr 1.34, BUN 26 and GFR >60 on 01/13/2020. -Continue to monitor BMP  FEN/GI: Heart carb modified PPx: Lovenox  Disposition: anticipate discharge home with home health   Subjective:  Patient seen and examined at bedside. No overnight events reported. He is awaiting discharge, potentially today and denies any new complaints at this time.  Objective: Temp:  [97.4 F (36.3 C)-98.6 F (37 C)] 98.1 F (36.7 C) (07/16 0444) Pulse Rate:  [54-61] 54 (07/16 0444) Resp:  [15-17] 17 (07/16 0444) BP: (111-118)/(60-73) 111/60 (07/16 0444) SpO2:  [99 %-100 %] 99 % (07/16 0444) Physical Exam: General: Patient in no acute distress, resting comfortably in bed, eager for discharge. Cardiovascular: regular rate and rhythm, no murmurs or gallops noted  Respiratory: lungs clear to auscultation bilaterally Abdomen: soft, nontender upon palpation, active bowel sounds  Extremities: 1+ pitting edema on the left LE, presence of left LE edema below the ankle, distal pulse intact on the left Psych: mood appropriate, no agitation noted  Laboratory: Recent Labs  Lab 01/12/20 0401 01/13/20 0357  WBC 4.5 3.2*  HGB 11.3* 11.7*  HCT 34.9* 35.3*  PLT 263 240   Recent Labs  Lab 01/12/20 0401 01/12/20 2356 01/13/20 0357  NA 137 134* 136  K 4.0 4.3 4.3  CL 101 101 101  CO2 24 26 25   BUN 24* 27* 26*  CREATININE 1.37* 1.25* 1.34*  CALCIUM 8.9 8.9 8.9  GLUCOSE  65* 198* 146*       Imaging/Diagnostic Tests: No results found.  Donney Dice, DO 01/14/2020, 9:36 AM PGY-1, [place] Intern pager: [phone], text pages welcome

## 2020-01-14 NOTE — Progress Notes (Addendum)
PT Cancellation Note  Patient Details Name: Adam Ray MRN: [id number] DOB: [birth year]   Cancelled Treatment:    Reason Eval/Treat Not Completed: (P) Other (comment) (On arrival patient seated in recliner and reports he has been having a rough day.  PTA asked why and Pt reports he had an unwitnessed fall this am in the bathroom and that his knee is throbbing 10/10 pain.  He reports he landed on his knee but did not want to bother the nurse so he did not tell her.  Will await assessment from RN before PT.)   [name] Eli Hose 01/14/2020, 1:01 PM  Erasmo Leventhal , PTA Acute Rehabilitation Services Pager [phone] Office [phone]

## 2020-01-14 NOTE — Progress Notes (Signed)
Called by the physical therapist that patient verbalizes he fell in the bathroom this morning and he did not tell it to anyone. When asked he said he did fall and when asked what time he said about 10, I told the patient I was in the room about that time but patient said he did, when asked how did he get up, he said he got up, no injury was noted, no signs of any abrasion or skin discoloration, dressing to right leg incision clean and dry, denies any pain. Nurse Tech on duty stated he did not hear any fall or witness any fall.Charge nurse made aware. Will monitor.

## 2020-01-14 NOTE — Care Management (Signed)
Received a call from [facility]. His DME will be delivered to the home today between 3 pm and 5 pm. Melissa spoke to patient's wife. Ramp will be delivered later . Wife aware.   Melissa left a message for patient.    Magdalen Spatz RN

## 2020-01-14 NOTE — Progress Notes (Signed)
MD made aware of the fall that patient is claiming.

## 2020-01-14 NOTE — Progress Notes (Signed)
Occupational Therapy Treatment Patient Details Name: Adam Ray MRN: [id number] DOB: October 27, 1955 Today's Date: 01/14/2020    History of present illness 64 y.o.male who presents with osteomyelitis abscess right foot. Patient s/p transtibial amputation and wound vac placement. Significant PMH: CKD, COPD, DM type 2.    OT comments  Pt seen for OT follow up session. He reports he had an unwitnessed fall in bathroom earlier this AM resulting in 8/10 R knee pain. Session completed conservatively as pt has xray pending. Pt able to complete  Bed mobility and transfers with supervision-min guard assist with RW this session. Pt engaged in peri hygiene and donning new hospital gown with set up assist. Educated pt on bathroom safety (specifically with threshold to enter bathroom) and how to reduce falls. Pt being taken to x-ray at end of session. D/c recs appropriate, will continue to follow.    Follow Up Recommendations  CIR    Equipment Recommendations  3 in 1 bedside commode;Tub/shower bench;Wheelchair (measurements OT);Wheelchair cushion (measurements OT)    Recommendations for Other Services      Precautions / Restrictions Precautions Precautions: Fall Required Braces or Orthoses: Other Brace Other Brace: limb guard Restrictions Weight Bearing Restrictions: Yes RLE Weight Bearing: Non weight bearing       Mobility Bed Mobility Overal bed mobility: Needs Assistance Bed Mobility: Sit to Supine     Supine to sit: Supervision        Transfers Overall transfer level: Needs assistance Equipment used: Rolling walker (2 wheeled) Transfers: Sit to/from Stand Sit to Stand: Min guard Stand pivot transfers: Min guard            Balance Overall balance assessment: Needs assistance Sitting-balance support: Feet supported Sitting balance-Leahy Scale: Good     Standing balance support: Bilateral upper extremity supported;During functional activity Standing balance-Leahy Scale:  Poor Standing balance comment: reliant on RW                           ADL either performed or assessed with clinical judgement   ADL Overall ADL's : Needs assistance/impaired                         Toilet Transfer: Min guard;Ambulation;RW Toilet Transfer Details (indicate cue type and reason): completing hopping mobility with RW         Functional mobility during ADLs: Min guard;Cueing for safety;Rolling walker General ADL Comments: session conducted with caution due to pt reporting fall in bathroom earlier this AM. Has x-ray of R knee pending     Vision Baseline Vision/History: No visual deficits     Perception     Praxis      Cognition Arousal/Alertness: Awake/alert Behavior During Therapy: WFL for tasks assessed/performed Overall Cognitive Status: Within Functional Limits for tasks assessed                                          Exercises     Shoulder Instructions       General Comments      Pertinent Vitals/ Pain       Pain Assessment: 0-10 Pain Score: 8  Pain Location: R knee from unwitnessed fall earlier in day Pain Descriptors / Indicators: Discomfort;Guarding;Grimacing Pain Intervention(s): Monitored during session;Repositioned  Home Living  Prior Functioning/Environment              Frequency  Min 2X/week        Progress Toward Goals  OT Goals(current goals can now be found in the care plan section)  Progress towards OT goals: Progressing toward goals  Acute Rehab OT Goals Patient Stated Goal: go to CIR OT Goal Formulation: With patient/family Time For Goal Achievement: 01/20/20 Potential to Achieve Goals: Good  Plan Discharge plan remains appropriate    Co-evaluation                 AM-PAC OT "6 Clicks" Daily Activity     Outcome Measure   Help from another person eating meals?: None Help from another person taking  care of personal grooming?: A Little Help from another person toileting, which includes using toliet, bedpan, or urinal?: A Little Help from another person bathing (including washing, rinsing, drying)?: A Little Help from another person to put on and taking off regular upper body clothing?: None Help from another person to put on and taking off regular lower body clothing?: A Little 6 Click Score: 20    End of Session Equipment Utilized During Treatment: Rolling walker  OT Visit Diagnosis: Unsteadiness on feet (R26.81);Other abnormalities of gait and mobility (R26.89);Pain Pain - Right/Left: Right Pain - part of body: Leg   Activity Tolerance Patient tolerated treatment well   Patient Left in bed;with call bell/phone within reach;Other (comment) (on way to xray)   Nurse Communication Mobility status        Time: 8088-1103 OT Time Calculation (min): 11 min  Charges: OT General Charges $OT Visit: 1 Visit OT Treatments $Self Care/Home Management : 8-22 mins  Zenovia Jarred, MSOT, OTR/L [facility] Office Number: [phone] Pager: [phone]  Zenovia Jarred 01/14/2020, 6:07 PM

## 2020-01-14 NOTE — Progress Notes (Signed)
PT Cancellation Note  Patient Details Name: WINFRED IIAMS MRN: [id number] DOB: 09/17/1955   Cancelled Treatment:    Reason Eval/Treat Not Completed: (P) Medical issues which prohibited therapy (Pt awating xray on R knee, will defer PT today.)   Darin Arndt Eli Hose 01/14/2020, 4:07 PM Erasmo Leventhal , PTA Acute Rehabilitation Services Pager [phone] Office [phone]

## 2020-01-15 DIAGNOSIS — Z89511 Acquired absence of right leg below knee: Secondary | ICD-10-CM

## 2020-01-15 DIAGNOSIS — Z89512 Acquired absence of left leg below knee: Secondary | ICD-10-CM

## 2020-01-15 LAB — GLUCOSE, CAPILLARY
Glucose-Capillary: 140 mg/dL — ABNORMAL HIGH (ref 70–99)
Glucose-Capillary: 142 mg/dL — ABNORMAL HIGH (ref 70–99)

## 2020-01-15 MED ORDER — INSULIN GLARGINE 100 UNIT/ML ~~LOC~~ SOLN: 50.0000 [IU] | Freq: Every day | SUBCUTANEOUS | 0 refills | Status: DC

## 2020-01-15 MED ORDER — POLYETHYLENE GLYCOL 3350 17 G PO PACK: 17.0000 g | PACK | Freq: Every day | ORAL | 0 refills | Status: DC

## 2020-01-15 MED ORDER — OXYCODONE-ACETAMINOPHEN 5-325 MG PO TABS: 1.0000 | ORAL_TABLET | Freq: Three times a day (TID) | ORAL | 0 refills | Status: DC | PRN

## 2020-01-15 NOTE — TOC Progression Note (Signed)
Transition of Care [facility]) - Progression Note    Patient Details  Name: Adam Ray MRN: [id number] Date of Birth: [birth year]  Transition of Care [facility]) CM/SW [place], [place] Phone Number: [phone] 01/15/2020, 1:29 PM  Clinical Narrative:     CSW was alerted that patient needed assistance for transportation due to family members not being able to provide transportation. CSW confirmed that it was medically safe to transport patient via taxi and was informed that he could be transported by taxi. RN confirmed patient's home address. CSW provided taxi voucher.       Expected Discharge Plan and Services     Discharge Planning Services: CM Consult   Living arrangements for the past 2 months: Single Family Home Expected Discharge Date: 01/15/20                                     Social Determinants of Health (SDOH) Interventions    Readmission Risk Interventions No flowsheet data found.

## 2020-01-15 NOTE — Progress Notes (Signed)
Bed alarm going off. Tech arrived to room Pt sitting on side of bed refusing to sit back so the alarm could be reset.

## 2020-01-15 NOTE — TOC Transition Note (Signed)
Transition of Care [facility]) - CM/SW Discharge Note   Patient Details  Name: THADD APUZZO MRN: [id number] Date of Birth: 29-Oct-1955  Transition of Care [facility]) CM/SW Contact:  Bartholomew Crews, RN Phone Number: [phone] 01/15/2020, 2:19 PM   Clinical Narrative:     Spoke with patient at the bedside to discuss home health needs. Verified patient address [place], [place], [place] [address] and phone [phone] or [phone]. [facility] and he thought he was already set up. Spoke with Ronalee Belts at [place] on the phone while at patient bedside. [place] ready for start of care on Monday. Patient's PCP at [facility] is Dr. Cherlynn Kaiser. No further TOC needs identified.    Final next level of care: [place] Barriers to Discharge: No Barriers Identified   Patient Goals and CMS Choice Patient states their goals for this hospitalization and ongoing recovery are:: home with home health CMS Medicare.gov Compare Post Acute Care list provided to:: Patient Choice offered to / list presented to : Patient  Discharge Placement                       Discharge Plan and Services   Discharge Planning Services: CM Consult                      HH Arranged: PT, OT [facility] Agency: [place] Date [place]: 01/15/20 Time [place]: 1400 Representative spoke with at [place]: [place] ([place]) Interventions     Readmission Risk Interventions No flowsheet data found.

## 2020-01-15 NOTE — Progress Notes (Addendum)
Family Medicine Teaching Service Daily Progress Note         Intern Pager: [phone] Patient name: Adam Ray Medical record number: [id number] Date of birth: Feb 08, 1956 Age: 64 y.o. Gender: male  Primary Care Provider: [name], MD Consultants: Orthopaedics and Pharmacy Code Status: FULL  Pt Overview and Major Events to Date:  12/31/2019: admitted forLeg Swelling 01/02/2020: Seen by Ortho, recommend transtibial amputation 01/06/2020: Transtibial amputation with Dr. Sharol Given  Assessment and Plan: [name] a 64 y.o.malewho presented w/ lower extremity edema and severe cellulitis. PMHxis significant forhypertension, CAD, PAD, HFpEF, insulin-dependent diabetes, CKD 2,osteoarthritis andhistory of alcohol abuse.  POD#4S/p Righttranstibial amputation Pain well controlled, denies any generalized or localized pain.Follow upwith Orthopedic surgery after dischargeand they will discontinue wound vac at that time. Patient remains medically stable for discharge to home with home health. Patient R knee XR post fall, showed no bony abnormalities. - PT/OT: rolling walker with 5' wheels, wheelchair, wheelchair cushion  - pain control PRN: acetaminophen, robaxin, oxy IR -Per case management, patient now wants to be discharged home with home health. May be discharged home with wheelchair, 3 in 1, walker and 2 ramps. Patient medically stable for discharge. -Follow up with orthoafter discharge -Social work has discussed transportation home with patient -Awaiting DME  Type 2 Diabetes, non insulin dependent  CBG trend has 7/16 87> 166> 161 >167 . - Continue sSSI -ContinueLantus50u daily - continue rosuvastatin  -Continuehome med jardiance 25 mg -Continue to monitor -Pharmacy had an extensive discussion on 01/13/2020 with the patient on his change in DM management during hospitalization and how this change continues after he goes home. -Due to medication change so close to  discharge, follow up with PCP to check on current insulin regimen and BMP to assess renal function.  HTN- stable BP today is149/74.  - continue lisinoprilandmetoprolol -continue to monitor vitals -Follow up with PCP  CAD, PAD - continue ticagrelor, ASA, nitroglycerin PRN  HFpEF Stable.Appears euvolemic on exam. - continue home meds  CKD II Stable following discontinuation of IV antibiotics. -Continue to monitor BMP   FEN/GI: Heart carb modified PPx: Lovenox  Disposition: Home w/ HH  Subjective:  Patient is awake and only concerned about the 10/10 pain in his stump. He otherwise has no complaints and is ready to go home. He had no overnight events. Knee Xray resulted WNL.  Objective: Temp:  [98.1 F (36.7 C)-98.4 F (36.9 C)] 98.2 F (36.8 C) (07/16 2059) Pulse Rate:  [54-65] 65 (07/16 2059) Resp:  [17-18] 18 (07/16 2059) BP: (111-135)/(60-69) 126/68 (07/16 2059) SpO2:  [99 %] 99 % (07/16 2059) Physical Exam: General: Resting comfortably in bed with RLE elevated. Cardiovascular: Regular rate and rhythm, no murmur detected Respiratory: Clear and equal to bil. Auscl., no extra work of breathing noted. Abdomen: Normoactive bowel sounds, no pain to palpation Extremities: Stump remains in compression sock and elevated, pain to the area without palpation. LLE has minimal edema and distal pulse is intact.  Laboratory: Recent Labs  Lab 01/12/20 0401 01/13/20 0357  WBC 4.5 3.2*  HGB 11.3* 11.7*  HCT 34.9* 35.3*  PLT 263 240   Recent Labs  Lab 01/12/20 0401 01/12/20 2356 01/13/20 0357  NA 137 134* 136  K 4.0 4.3 4.3  CL 101 101 101  CO2 24 26 25   BUN 24* 27* 26*  CREATININE 1.37* 1.25* 1.34*  CALCIUM 8.9 8.9 8.9  GLUCOSE 65* 198* 146*      Imaging/Diagnostic Tests: DG Knee 1-2 Views  Right  Result Date: 01/14/2020 CLINICAL DATA:  Fall.  Status post right below-knee amputation. EXAM: RIGHT KNEE - 1-2 VIEW COMPARISON:  None. FINDINGS: Status  post right below-knee amputation. No evidence of traumatic fracture or dislocation is noted. Surgical staples are again noted in the stomach. IMPRESSION: Status post right below-knee amputation. Electronically Signed   By: Marijo Conception M.D.   On: 01/14/2020 18:53    Freida Busman, MD 01/15/2020, 3:20 AM PGY-1, [place] Intern pager: [phone], text pages welcome

## 2020-01-16 NOTE — Discharge Summary (Addendum)
[facility] Discharge Summary  Patient name: Adam Ray Medical record number: [id number] Date of birth: Jun 21, 1956 Age: 64 y.o. Gender: male Date of Admission: 12/31/2019  Date of Discharge: 01/15/2020 Admitting Physician: Rise Patience, MD  Primary Care Provider: [name], MD Consultants: Orthopaedics, pharmacy   Indication for Hospitalization: edema and severe cellulitis of the right LE  Discharge Diagnoses/Problem List:  S/p right transtibial amputation  Non-insulin dependent Type 2 DM Hypertension CAD, PAD HFpEF CKD stage 2  Disposition: discharged home with home health  Discharge Condition: stable  Discharge Exam:  Physical Exam: General: Resting comfortably in bed with RLE elevated. Cardiovascular: Regular rate and rhythm, no murmur detected Respiratory: Clear and equal to bil. Auscl., no extra work of breathing noted. Abdomen: Normoactive bowel sounds, no pain to palpation Extremities: Stump remains in compression sock and elevated, pain to the area without palpation. LLE has minimal edema and distal pulse is intact.  Physical examination performed by Dr. Candie Chroman  Brief [facility] Course:   Right transtibial amputation, 2/2 osteomyelitis and abscess  Presented due to worsening pain and right lower extremity erythema for the past 2 weeks, in setting of known chronic abscess along previous transmetatarsal with poor wound healing.  Admitted for extensive cellulitis of right LE extending up to level of knee, reassuringly did not appear septic on arrival.  Started on IV broad-spectrum antibiotics, vancomycin and Zosyn.  Right LE x-ray was not conclusive for osteomyelitis, but MRI was performed and conclusive for osteomyelitis.  Dr. Sharol Given, orthopedics, was consulted (follows with him outpatient as well), recommended a transtibial amputation and underwent this successfully on 7/8 with wound VAC placement.  He continued antibiotics  through 24 hours postoperatively, completing a 5-day course.  Wound VAC was removed prior to discharge, will need follow-up with orthopedics in 1 week.  Initially recommended SNF/CIR, however patient ultimately decided to go home with home health instead.  Type 2 DM Patient initially placed on his home diabetes regimen in addition to sliding scale and with the exception of home jardiance. He had multiple hypoglycemic episodes in the morning and Lantus was increased to 60 U daily. About a week later, patient had CBG of 72 and change was made to decrease to Lantus 50 U daily and add jardiance. Blood glucose levels were maintained and patient discharged on this regimen. Prior to discharge, pharmacy discussed with the patient regarding these medication changes and patient agreed and did not have any concerns. Encouraged to follow up with PCP.   Issues for Follow Up:  1. Please ensure he follows up with orthopedics, Dr. Sharol Given, in the next week. 2. Monitor CBGs, substantially decreased home regimen during hospitalization.  Previous home regimen of Lantus 80 BID + Jardiance, sent home with Lantus 50 U due to several hypoglycemic episodes.  CBGs at DC 150s.  3. Assess fluid status.  Home Lasix for chronic edema/HFpEF was held during admission and remained euvolemic, considering adding back on as appropriate. 4. Ensure he gets appropriate DME through the [place], received wheelchair prior to DC. However additionally needed ramps, 3n1, PT/OT, and home health RN.    Significant Procedures:  01/06/2020 Transtibial amputation of right LE performed by Dr. Sharol Given   Significant Labs and Imaging:  Recent Labs  Lab 01/12/20 0401 01/13/20 0357  WBC 4.5 3.2*  HGB 11.3* 11.7*  HCT 34.9* 35.3*  PLT 263 240   Recent Labs  Lab 01/10/20 0129 01/10/20 0129 01/12/20 0401 01/12/20 0401 01/12/20 2356 01/13/20 0357  NA 135  --  137  --  134* 136  K 4.1   < > 4.0   < > 4.3 4.3  CL 103  --  101  --  101 101  CO2 26  --   24  --  26 25  GLUCOSE 208*  --  65*  --  198* 146*  BUN 19  --  24*  --  27* 26*  CREATININE 1.19  --  1.37*  --  1.25* 1.34*  CALCIUM 8.8*  --  8.9  --  8.9 8.9   < > = values in this interval not displayed.      Results/Tests Pending at Time of Discharge:  No results found.  Discharge Medications:  Allergies as of 01/15/2020      Reactions   Codeine Other (See Comments)   Heart attack.      Medication List    STOP taking these medications   furosemide 20 MG tablet Commonly known as: LASIX   nabumetone 500 MG tablet Commonly known as: RELAFEN   NovoLOG FlexPen 100 UNIT/ML FlexPen Generic drug: insulin aspart     TAKE these medications   acetaminophen 500 MG tablet Commonly known as: TYLENOL Take 500 mg by mouth 2 (two) times daily as needed for headache (pain).   albuterol 108 (90 Base) MCG/ACT inhaler Commonly known as: VENTOLIN HFA Inhale 2 puffs into the lungs every 4 (four) hours as needed for wheezing or shortness of breath.   aspirin 81 MG chewable tablet Chew 81 mg by mouth daily. Notes to patient: Next dose is due tomorrow at 10:00 AM   baclofen 10 MG tablet Commonly known as: LIORESAL Take 10 mg by mouth 2 (two) times daily as needed for muscle spasms.   CALCIUM PO Take 1 tablet by mouth daily. Notes to patient: Resume to your regular schedule   empagliflozin 25 MG Tabs tablet Commonly known as: JARDIANCE Take 12.5 mg by mouth daily. Notes to patient: Next dose is due tomorrow at 10:00 AM   famotidine 20 MG tablet Commonly known as: Pepcid Take 1 tablet (20 mg total) by mouth 2 (two) times daily. What changed:   when to take this  reasons to take this Notes to patient: Resume to your regular schedule   insulin glargine 100 UNIT/ML injection Commonly known as: LANTUS Inject 0.5 mLs (50 Units total) into the skin daily. What changed:   how much to take  when to take this Notes to patient: Next dose is due tomorrow at 10:00 AM    lisinopril 5 MG tablet Commonly known as: ZESTRIL Take 5 mg by mouth daily. Notes to patient: Next dose is due tomorrow at 10:00 AM   metoprolol tartrate 25 MG tablet Commonly known as: LOPRESSOR TAKE 0.5 TABLETS (12.5 MG TOTAL) BY MOUTH 2 (TWO) TIMES DAILY. Notes to patient: Next dose is due this evening at 10:00 PM   multivitamin with minerals Tabs tablet Take 1 tablet by mouth daily. Notes to patient: Resume to your regular schedule   nitroGLYCERIN 0.4 MG SL tablet Commonly known as: NITROSTAT Place 1 tablet (0.4 mg total) under the tongue every 5 (five) minutes x 3 doses as needed for chest pain.   oxyCODONE-acetaminophen 5-325 MG tablet Commonly known as: Percocet Take 1 tablet by mouth every 8 (eight) hours as needed for severe pain. Notes to patient: Next dose not earlier than 10:00 PM   polyethylene glycol 17 g packet Commonly known as: MIRALAX / GLYCOLAX  Take 17 g by mouth daily.   rosuvastatin 40 MG tablet Commonly known as: CRESTOR Take 20 mg by mouth daily. Notes to patient: Next dose is due tomorrow at 10:00 PM   sildenafil 100 MG tablet Commonly known as: VIAGRA Take 100 mg by mouth daily as needed for erectile dysfunction.   ticagrelor 90 MG Tabs tablet Commonly known as: BRILINTA Take 1 tablet (90 mg total) by mouth 2 (two) times daily. Notes to patient: Next dose is due tonight at 10:00 PM   Vitamin D3 50 MCG (2000 UT) Tabs Take 2,000 Units by mouth daily. Notes to patient: Resume to your regular schedule       Discharge Instructions: Please refer to Patient Instructions section of EMR for full details.  Patient was counseled important signs and symptoms that should prompt return to medical care, changes in medications, dietary instructions, activity restrictions, and follow up appointments.   Follow-Up Appointments:  Follow-up Information    Suzan Slick, NP Follow up in 1 week(s).   Specialty: Orthopedic Surgery Contact information: [address] [id number] [phone]        [place]. Go on 01/19/2020.   Why: at 9am with Dr. Manus Rudd for hospital follow up. You also have an appt with your PCP at the beginning of August.  Contact information: [place], [place] Follow up.   Specialty: [place] Why: the office will call to schedule home health visits Contact information: [address] [address] [place], [place], DO 01/16/2020, 5:00 PM PGY-1, [place] Upper-Level Resident Addendum    My edits for correction/addition/clarification are added. Please see also any attending notes.    Patriciaann Clan, DO  Family Medicine PGY-3

## 2020-01-17 ENCOUNTER — Other Ambulatory Visit: Payer: Self-pay

## 2020-01-17 ENCOUNTER — Encounter (HOSPITAL_COMMUNITY): Payer: Self-pay

## 2020-01-17 ENCOUNTER — Emergency Department (HOSPITAL_COMMUNITY): Payer: No Typology Code available for payment source

## 2020-01-17 ENCOUNTER — Inpatient Hospital Stay (HOSPITAL_COMMUNITY)
Admission: EM | Admit: 2020-01-17 | Discharge: 2020-01-19 | DRG: 683 | Disposition: A | Discharge status: Home / Self Care | Payer: No Typology Code available for payment source | Attending: Family Medicine | Admitting: Family Medicine

## 2020-01-17 DIAGNOSIS — Z8249 Family history of ischemic heart disease and other diseases of the circulatory system: Secondary | ICD-10-CM

## 2020-01-17 DIAGNOSIS — Z7982 Long term (current) use of aspirin: Secondary | ICD-10-CM

## 2020-01-17 DIAGNOSIS — Z823 Family history of stroke: Secondary | ICD-10-CM

## 2020-01-17 DIAGNOSIS — F1021 Alcohol dependence, in remission: Secondary | ICD-10-CM | POA: Diagnosis present

## 2020-01-17 DIAGNOSIS — N179 Acute kidney failure, unspecified: Secondary | ICD-10-CM | POA: Diagnosis not present

## 2020-01-17 DIAGNOSIS — I130 Hypertensive heart and chronic kidney disease with heart failure and stage 1 through stage 4 chronic kidney disease, or unspecified chronic kidney disease: Secondary | ICD-10-CM | POA: Diagnosis present

## 2020-01-17 DIAGNOSIS — Z885 Allergy status to narcotic agent status: Secondary | ICD-10-CM

## 2020-01-17 DIAGNOSIS — Z79891 Long term (current) use of opiate analgesic: Secondary | ICD-10-CM

## 2020-01-17 DIAGNOSIS — I152 Hypertension secondary to endocrine disorders: Secondary | ICD-10-CM | POA: Diagnosis present

## 2020-01-17 DIAGNOSIS — E1151 Type 2 diabetes mellitus with diabetic peripheral angiopathy without gangrene: Secondary | ICD-10-CM | POA: Diagnosis present

## 2020-01-17 DIAGNOSIS — F17210 Nicotine dependence, cigarettes, uncomplicated: Secondary | ICD-10-CM | POA: Diagnosis present

## 2020-01-17 DIAGNOSIS — Q231 Congenital insufficiency of aortic valve: Secondary | ICD-10-CM

## 2020-01-17 DIAGNOSIS — F411 Generalized anxiety disorder: Secondary | ICD-10-CM | POA: Diagnosis present

## 2020-01-17 DIAGNOSIS — H547 Unspecified visual loss: Secondary | ICD-10-CM | POA: Diagnosis present

## 2020-01-17 DIAGNOSIS — K760 Fatty (change of) liver, not elsewhere classified: Secondary | ICD-10-CM | POA: Diagnosis present

## 2020-01-17 DIAGNOSIS — E1142 Type 2 diabetes mellitus with diabetic polyneuropathy: Secondary | ICD-10-CM | POA: Diagnosis present

## 2020-01-17 DIAGNOSIS — I252 Old myocardial infarction: Secondary | ICD-10-CM

## 2020-01-17 DIAGNOSIS — J309 Allergic rhinitis, unspecified: Secondary | ICD-10-CM | POA: Diagnosis present

## 2020-01-17 DIAGNOSIS — Z79899 Other long term (current) drug therapy: Secondary | ICD-10-CM

## 2020-01-17 DIAGNOSIS — Z720 Tobacco use: Secondary | ICD-10-CM | POA: Diagnosis present

## 2020-01-17 DIAGNOSIS — Z833 Family history of diabetes mellitus: Secondary | ICD-10-CM

## 2020-01-17 DIAGNOSIS — I959 Hypotension, unspecified: Secondary | ICD-10-CM

## 2020-01-17 DIAGNOSIS — Z794 Long term (current) use of insulin: Secondary | ICD-10-CM

## 2020-01-17 DIAGNOSIS — I10 Essential (primary) hypertension: Secondary | ICD-10-CM | POA: Diagnosis present

## 2020-01-17 DIAGNOSIS — Z89511 Acquired absence of right leg below knee: Secondary | ICD-10-CM

## 2020-01-17 DIAGNOSIS — D573 Sickle-cell trait: Secondary | ICD-10-CM | POA: Diagnosis present

## 2020-01-17 DIAGNOSIS — Z20822 Contact with and (suspected) exposure to covid-19: Secondary | ICD-10-CM | POA: Diagnosis present

## 2020-01-17 DIAGNOSIS — R531 Weakness: Secondary | ICD-10-CM | POA: Diagnosis not present

## 2020-01-17 DIAGNOSIS — I2510 Atherosclerotic heart disease of native coronary artery without angina pectoris: Secondary | ICD-10-CM | POA: Diagnosis present

## 2020-01-17 DIAGNOSIS — I739 Peripheral vascular disease, unspecified: Secondary | ICD-10-CM | POA: Diagnosis present

## 2020-01-17 DIAGNOSIS — K5900 Constipation, unspecified: Secondary | ICD-10-CM | POA: Diagnosis present

## 2020-01-17 DIAGNOSIS — I700 Atherosclerosis of aorta: Secondary | ICD-10-CM | POA: Diagnosis present

## 2020-01-17 DIAGNOSIS — I5032 Chronic diastolic (congestive) heart failure: Secondary | ICD-10-CM | POA: Diagnosis present

## 2020-01-17 DIAGNOSIS — F329 Major depressive disorder, single episode, unspecified: Secondary | ICD-10-CM | POA: Diagnosis present

## 2020-01-17 DIAGNOSIS — E113399 Type 2 diabetes mellitus with moderate nonproliferative diabetic retinopathy without macular edema, unspecified eye: Secondary | ICD-10-CM | POA: Diagnosis present

## 2020-01-17 DIAGNOSIS — E785 Hyperlipidemia, unspecified: Secondary | ICD-10-CM | POA: Diagnosis present

## 2020-01-17 DIAGNOSIS — E1149 Type 2 diabetes mellitus with other diabetic neurological complication: Secondary | ICD-10-CM

## 2020-01-17 DIAGNOSIS — R1011 Right upper quadrant pain: Secondary | ICD-10-CM | POA: Diagnosis present

## 2020-01-17 DIAGNOSIS — H538 Other visual disturbances: Secondary | ICD-10-CM | POA: Diagnosis present

## 2020-01-17 DIAGNOSIS — J449 Chronic obstructive pulmonary disease, unspecified: Secondary | ICD-10-CM | POA: Diagnosis present

## 2020-01-17 DIAGNOSIS — N182 Chronic kidney disease, stage 2 (mild): Secondary | ICD-10-CM | POA: Diagnosis present

## 2020-01-17 DIAGNOSIS — E1122 Type 2 diabetes mellitus with diabetic chronic kidney disease: Secondary | ICD-10-CM | POA: Diagnosis present

## 2020-01-17 DIAGNOSIS — Z7902 Long term (current) use of antithrombotics/antiplatelets: Secondary | ICD-10-CM

## 2020-01-17 HISTORY — DX: Atherosclerotic heart disease of native coronary artery without angina pectoris: I25.10

## 2020-01-17 LAB — URINALYSIS, ROUTINE W REFLEX MICROSCOPIC
Bilirubin Urine: NEGATIVE
Glucose, UA: >=500 mg/dL — AB
Hgb urine dipstick: NEGATIVE
Ketones, ur: NEGATIVE mg/dL
Leukocytes,Ua: NEGATIVE
Nitrite: NEGATIVE
Protein, ur: NEGATIVE mg/dL
Specific Gravity, Urine: 1.021 (ref 1.005–1.030)
pH: 5 (ref 5.0–8.0)

## 2020-01-17 LAB — CBC WITH DIFFERENTIAL/PLATELET
Abs Immature Granulocytes: 0.03 10*3/uL (ref 0.00–0.07)
Basophils Absolute: 0.1 10*3/uL (ref 0.0–0.1)
Basophils Relative: 1 %
Eosinophils Absolute: 0.1 10*3/uL (ref 0.0–0.5)
Eosinophils Relative: 2 %
HCT: 35.9 % — ABNORMAL LOW (ref 39.0–52.0)
Hemoglobin: 11.4 g/dL — ABNORMAL LOW (ref 13.0–17.0)
Immature Granulocytes: 1 %
Lymphocytes Relative: 27 %
Lymphs Abs: 1.1 10*3/uL (ref 0.7–4.0)
MCH: 28.7 pg (ref 26.0–34.0)
MCHC: 31.8 g/dL (ref 30.0–36.0)
MCV: 90.4 fL (ref 80.0–100.0)
Monocytes Absolute: 0.5 10*3/uL (ref 0.1–1.0)
Monocytes Relative: 12 %
Neutro Abs: 2.4 10*3/uL (ref 1.7–7.7)
Neutrophils Relative %: 57 %
Platelets: 196 10*3/uL (ref 150–400)
RBC: 3.97 MIL/uL — ABNORMAL LOW (ref 4.22–5.81)
RDW: 13.1 % (ref 11.5–15.5)
WBC: 4.2 10*3/uL (ref 4.0–10.5)
nRBC: 0 % (ref 0.0–0.2)

## 2020-01-17 LAB — COMPREHENSIVE METABOLIC PANEL
ALT: 27 U/L (ref 0–44)
AST: 23 U/L (ref 15–41)
Albumin: 3.4 g/dL — ABNORMAL LOW (ref 3.5–5.0)
Alkaline Phosphatase: 102 U/L (ref 38–126)
Anion gap: 12 (ref 5–15)
BUN: 64 mg/dL — ABNORMAL HIGH (ref 8–23)
CO2: 23 mmol/L (ref 22–32)
Calcium: 9.2 mg/dL (ref 8.9–10.3)
Chloride: 97 mmol/L — ABNORMAL LOW (ref 98–111)
Creatinine, Ser: 2.68 mg/dL — ABNORMAL HIGH (ref 0.61–1.24)
GFR calc Af Amer: 28 mL/min — ABNORMAL LOW (ref >60)
GFR calc non Af Amer: 24 mL/min — ABNORMAL LOW (ref >60)
Glucose, Bld: 314 mg/dL — ABNORMAL HIGH (ref 70–99)
Potassium: 4.5 mmol/L (ref 3.5–5.1)
Sodium: 132 mmol/L — ABNORMAL LOW (ref 135–145)
Total Bilirubin: 0.8 mg/dL (ref 0.3–1.2)
Total Protein: 7.5 g/dL (ref 6.5–8.1)

## 2020-01-17 LAB — PROTIME-INR
INR: 1.1 (ref 0.8–1.2)
Prothrombin Time: 13.5 seconds (ref 11.4–15.2)

## 2020-01-17 LAB — TROPONIN I (HIGH SENSITIVITY)
Troponin I (High Sensitivity): 6 ng/L (ref <18)
Troponin I (High Sensitivity): 7 ng/L (ref <18)

## 2020-01-17 LAB — LIPASE, BLOOD: Lipase: 20 U/L (ref 11–51)

## 2020-01-17 LAB — LACTIC ACID, PLASMA: Lactic Acid, Venous: 1.4 mmol/L (ref 0.5–1.9)

## 2020-01-17 LAB — SARS CORONAVIRUS 2 BY RT PCR (HOSPITAL ORDER, PERFORMED IN ~~LOC~~ HOSPITAL LAB): SARS Coronavirus 2: NEGATIVE

## 2020-01-17 MED ORDER — SODIUM CHLORIDE 0.9 % IV BOLUS
1000.0000 mL | Freq: Once | INTRAVENOUS | Status: AC
  Administered 2020-01-17: 1000 mL via INTRAVENOUS

## 2020-01-17 MED ORDER — SENNA 8.6 MG PO TABS
1.0000 | ORAL_TABLET | Freq: Every day | ORAL | Status: DC
  Administered 2020-01-17 – 2020-01-19 (×3): 8.6 mg via ORAL
  Filled 2020-01-17 (×3): qty 1

## 2020-01-17 MED ORDER — OXYCODONE HCL 5 MG PO TABS
5.0000 mg | ORAL_TABLET | Freq: Three times a day (TID) | ORAL | Status: DC | PRN
  Filled 2020-01-17: qty 1

## 2020-01-17 MED ORDER — INSULIN GLARGINE 100 UNIT/ML ~~LOC~~ SOLN
15.0000 [IU] | Freq: Every day | SUBCUTANEOUS | Status: DC
  Administered 2020-01-18 – 2020-01-19 (×2): 15 [IU] via SUBCUTANEOUS
  Filled 2020-01-17 (×2): qty 0.15

## 2020-01-17 MED ORDER — ROSUVASTATIN CALCIUM 5 MG PO TABS: 20.0000 mg | ORAL_TABLET | Freq: Every day | ORAL | Status: DC

## 2020-01-17 MED ORDER — VITAMIN D 25 MCG (1000 UNIT) PO TABS
2000.0000 [IU] | ORAL_TABLET | Freq: Every day | ORAL | Status: DC
  Administered 2020-01-18 – 2020-01-19 (×2): 2000 [IU] via ORAL
  Filled 2020-01-17 (×2): qty 2

## 2020-01-17 MED ORDER — ASPIRIN 81 MG PO CHEW
81.0000 mg | CHEWABLE_TABLET | Freq: Every day | ORAL | Status: DC
  Administered 2020-01-18 – 2020-01-19 (×2): 81 mg via ORAL
  Filled 2020-01-17 (×2): qty 1

## 2020-01-17 MED ORDER — TICAGRELOR 90 MG PO TABS
90.0000 mg | ORAL_TABLET | Freq: Two times a day (BID) | ORAL | Status: DC
  Administered 2020-01-17 – 2020-01-19 (×4): 90 mg via ORAL
  Filled 2020-01-17 (×4): qty 1

## 2020-01-17 MED ORDER — ALBUTEROL SULFATE (2.5 MG/3ML) 0.083% IN NEBU: 2.5000 mg | INHALATION_SOLUTION | RESPIRATORY_TRACT | Status: DC | PRN

## 2020-01-17 MED ORDER — INSULIN ASPART 100 UNIT/ML ~~LOC~~ SOLN
0.0000 [IU] | Freq: Three times a day (TID) | SUBCUTANEOUS | Status: DC
  Administered 2020-01-18: 3 [IU] via SUBCUTANEOUS
  Administered 2020-01-18: 2 [IU] via SUBCUTANEOUS

## 2020-01-17 MED ORDER — ENOXAPARIN SODIUM 30 MG/0.3ML ~~LOC~~ SOLN
30.0000 mg | Freq: Every day | SUBCUTANEOUS | Status: DC
  Administered 2020-01-17: 30 mg via SUBCUTANEOUS
  Filled 2020-01-17: qty 0.3

## 2020-01-17 MED ORDER — SODIUM CHLORIDE 0.9 % IV BOLUS: 500.0000 mL | Freq: Once | INTRAVENOUS | Status: DC

## 2020-01-17 MED ORDER — POLYETHYLENE GLYCOL 3350 17 G PO PACK
17.0000 g | PACK | Freq: Every day | ORAL | Status: DC
  Administered 2020-01-17 – 2020-01-19 (×3): 17 g via ORAL
  Filled 2020-01-17 (×3): qty 1

## 2020-01-17 MED ORDER — SODIUM CHLORIDE 0.9 % IV BOLUS
500.0000 mL | Freq: Once | INTRAVENOUS | Status: AC
  Administered 2020-01-17: 500 mL via INTRAVENOUS

## 2020-01-17 MED ORDER — ACETAMINOPHEN 500 MG PO TABS
1000.0000 mg | ORAL_TABLET | Freq: Two times a day (BID) | ORAL | Status: DC
  Administered 2020-01-17 – 2020-01-19 (×4): 1000 mg via ORAL
  Filled 2020-01-17 (×4): qty 2

## 2020-01-17 MED ORDER — FAMOTIDINE 20 MG PO TABS
20.0000 mg | ORAL_TABLET | Freq: Two times a day (BID) | ORAL | Status: DC
  Administered 2020-01-17 – 2020-01-19 (×4): 20 mg via ORAL
  Filled 2020-01-17 (×4): qty 1

## 2020-01-17 NOTE — ED Provider Notes (Signed)
[facility] EMERGENCY DEPARTMENT Provider Note   CSN: [id number] Arrival date & time: 01/17/20  1556     History No chief complaint on file.   Adam Ray is a 63 y.o. male.  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  Home health nurse visiting today found his blood pressure to be low with him to be complaining of some weakness.  He tells me he has had some right upper quadrant pain that began yesterday.  No fevers chills nausea vomiting diarrhea or dysuria.  No other wounds.  Denies any particular problem with his BKA and has not noticed much drainage.  The history is provided by the patient and the EMS personnel.  Weakness Severity:  Moderate Onset quality:  Gradual Timing:  Constant Progression:  Worsening Chronicity:  New Context: recent infection   Relieved by:  Nothing Worsened by:  Activity Ineffective treatments:  None tried Associated symptoms: abdominal pain   Associated symptoms: no chest pain, no cough, no diarrhea, no dysuria, no fever, no foul-smelling urine, no headaches, no loss of consciousness, no nausea, no shortness of breath and no vomiting   Abdominal pain:    Location:  RUQ   Quality: aching     Severity:  Moderate   Onset quality:  Gradual   Timing:  Intermittent   Chronicity:  New Risk factors: diabetes        Past Medical History:  Diagnosis Date  . Allergy   . Arthritis   . Bronchitis   . Cataract   . Chronic kidney disease   . Claudication [facility])    right foot ray resection  . Colon polyps    hyperplastic  . COPD (chronic obstructive pulmonary disease) ([place])   . Diabetes mellitus   . Genital warts   . Gout   . Hyperlipidemia   . Hypertension   . Osteomyelitis of third toe of right foot ([place])   . Pneumonia   . Status post amputation of toe of right foot ([place]) 09/24/2016    Patient Active Problem List   Diagnosis Date Noted  . Cutaneous abscess of right foot   .  Cellulitis of right foot 01/01/2020  . Cellulitis 01/01/2020  . Nocturnal leg cramps 10/28/2019  . Hyperlipidemia 05/26/2019  . Edema leg 12/29/2018  . STEMI involving left circumflex coronary artery ([place]) 07/12/2018  . Status post transmetatarsal amputation of foot, right ([place]) 07/08/2018  . Limited mobility 05/12/2018  . Gingival foreign body 07/22/2017  . Anxiety state 06/02/2017  . Leg swelling 01/29/2017  . Idiopathic chronic venous hypertension of both lower extremities with inflammation 09/24/2016  . Testicular mass 04/18/2016  . Subacute osteomyelitis, right ankle and foot ([place]) 01/29/2016  . Diabetic polyneuropathy associated with type 2 diabetes mellitus ([place])   . Type 2 diabetes mellitus with neurologic complication, with long-term current use of insulin ([place]) 12/08/2015  . Pulmonary nodule 02/01/2015  . Depression 12/09/2013  . Warts, genital 08/20/2013  . 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 Diabetic retinopathy ([place]) 01/28/2013  . Sleep apnea 09/06/2010  . 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 Essential hypertension 11/09/2008  . COPD, mild ([place]) 10/06/2006  . SICKLE-CELL TRAIT 04/26/2006  . ERECTILE DYSFUNCTION 04/26/2006  . Tobacco abuse 04/26/2006  . PAD (peripheral artery disease) ([place]) 04/26/2006  . ALLERGIC RHINITIS 04/26/2006    Past Surgical History:  Procedure Laterality Date  . AMPUTATION Right 01/31/2016   Procedure: Right 2nd Toe Amputation;  Surgeon: Newt Minion, MD;  Location: [facility];  Service: Orthopedics;  Laterality: Right;  . AMPUTATION Right 07/08/2018   Procedure: RIGHT TRANSMETATARSAL AMPUTATION;  Surgeon: Newt Minion, MD;  Location: [facility];  Service: Orthopedics;  Laterality: Right;  . AMPUTATION Right 01/05/2020   Procedure: RIGHT BELOW KNEE AMPUTATION;  Surgeon: Newt Minion, MD;  Location: [facility];  Service: Orthopedics;  Laterality: Right;  . CATARACT EXTRACTION     right eye  . COLONOSCOPY    . CORONARY  STENT INTERVENTION N/A 07/12/2018   Procedure: CORONARY STENT INTERVENTION;  Surgeon: Troy Sine, MD;  Location: [place] CV LAB;  Service: Cardiovascular;  Laterality: N/A;  . CORONARY/GRAFT ACUTE MI REVASCULARIZATION N/A 07/12/2018   Procedure: Coronary/Graft Acute MI Revascularization;  Surgeon: Troy Sine, MD;  Location: [place] CV LAB;  Service: Cardiovascular;  Laterality: N/A;  . I & D EXTREMITY  04/11/2012   Procedure: IRRIGATION AND DEBRIDEMENT EXTREMITY;  Surgeon: Wylene Simmer, MD;  Location: [facility];  Service: Orthopedics;  Laterality: Right;  . LEFT HEART CATH AND CORONARY ANGIOGRAPHY N/A 07/12/2018   Procedure: LEFT HEART CATH AND CORONARY ANGIOGRAPHY;  Surgeon: Troy Sine, MD;  Location: [facility] CV LAB;  Service: Cardiovascular;  Laterality: N/A;  . Surgery left great toe    . Tear ducts bilateral eyes    . TRANSMETATARSAL AMPUTATION Right 07/08/2018       Family History  Problem Relation Age of Onset  . Diabetes Mother   . Stroke Mother   . Heart failure Father   . Colon cancer Neg Hx   . Esophageal cancer Neg Hx   . Rectal cancer Neg Hx   . Stomach cancer Neg Hx     Social History   Tobacco Use  . Smoking status: Current Every Day Smoker    Packs/day: 0.30    Years: 48.00    Pack years: 14.40    Types: Cigars, Cigarettes    Start date: 07/02/1963  . Smokeless tobacco: Former Systems developer  . Tobacco comment: wants to use electric cigarettes.  not interested in pills, worried about chantix side effects  Vaping Use  . Vaping Use: Former  Substance Use Topics  . Alcohol use: Yes    Alcohol/week: 0.0 standard drinks    Comment: occassional use  . Drug use: No    Home Medications Prior to Admission medications   Medication Sig Start Date End Date Taking? Authorizing Provider  acetaminophen (TYLENOL) 500 MG tablet Take 500 mg by mouth 2 (two) times daily as needed for headache (pain).     [provider]  albuterol (VENTOLIN HFA) 108 (90  Base) MCG/ACT inhaler Inhale 2 puffs into the lungs every 4 (four) hours as needed for wheezing or shortness of breath. 12/08/19   Shirley, [place], DO  aspirin 81 MG chewable tablet Chew 81 mg by mouth daily.    [provider]  baclofen (LIORESAL) 10 MG tablet Take 10 mg by mouth 2 (two) times daily as needed for muscle spasms.    [provider]  CALCIUM PO Take 1 tablet by mouth daily.    [provider]  Cholecalciferol (VITAMIN D3) 50 MCG (2000 UT) TABS Take 2,000 Units by mouth daily.    [provider]  empagliflozin (JARDIANCE) 25 MG TABS tablet Take 12.5 mg by mouth daily.    [provider]  famotidine (PEPCID) 20 MG tablet Take 1 tablet (20 mg total) by mouth 2 (two) times daily.  Patient taking differently: Take 20 mg by mouth 2 (two) times daily as needed for heartburn or indigestion.  07/20/19   [name], MD  insulin glargine (LANTUS) 100 UNIT/ML injection Inject 0.5 mLs (50 Units total) into the skin daily. 01/15/20   Patriciaann Clan, DO  lisinopril (ZESTRIL) 5 MG tablet Take 5 mg by mouth daily.    [provider]  metoprolol tartrate (LOPRESSOR) 25 MG tablet TAKE 0.5 TABLETS (12.5 MG TOTAL) BY MOUTH 2 (TWO) TIMES DAILY. 10/26/19   Lorretta Harp, MD  Multiple Vitamin (MULTIVITAMIN WITH MINERALS) TABS tablet Take 1 tablet by mouth daily.    [provider]  nitroGLYCERIN (NITROSTAT) 0.4 MG SL tablet Place 1 tablet (0.4 mg total) under the tongue every 5 (five) minutes x 3 doses as needed for chest pain. 07/15/18   Kathyrn Drown D, NP  oxyCODONE-acetaminophen (PERCOCET) 5-325 MG tablet Take 1 tablet by mouth every 8 (eight) hours as needed for severe pain. 01/15/20 01/14/21  Patriciaann Clan, DO  polyethylene glycol (MIRALAX / GLYCOLAX) 17 g packet Take 17 g by mouth daily. 01/15/20   Patriciaann Clan, DO  rosuvastatin (CRESTOR) 40 MG tablet Take 20 mg by mouth daily.    [provider]  sildenafil  (VIAGRA) 100 MG tablet Take 100 mg by mouth daily as needed for erectile dysfunction.    [provider]  ticagrelor (BRILINTA) 90 MG TABS tablet Take 1 tablet (90 mg total) by mouth 2 (two) times daily. 10/07/18   Lorretta Harp, MD  levofloxacin (LEVAQUIN) 750 MG tablet Take 1 tablet (750 mg total) by mouth daily. For 14 days 07/07/12 07/24/12  Verdie Drown, Samuel Germany, MD    Allergies    Codeine  Review of Systems   Review of Systems  Constitutional: Negative for fever.  HENT: Negative for sore throat.   Eyes: Negative for visual disturbance.  Respiratory: Negative for cough and shortness of breath.   Cardiovascular: Negative for chest pain.  Gastrointestinal: Positive for abdominal pain. Negative for diarrhea, nausea and vomiting.  Genitourinary: Negative for dysuria.  Musculoskeletal: Negative for neck pain.  Skin: Positive for wound.  Neurological: Positive for weakness. Negative for loss of consciousness and headaches.    Physical Exam Updated Vital Signs BP 119/71   Pulse 79   Temp 98.3 F (36.8 C) (Oral)   Resp (!) 24   Ht 5\' 10"  (1.778 m)   Wt 77.1 kg   SpO2 98%   BMI 24.39 kg/m   Physical Exam Vitals and nursing note reviewed.  Constitutional:      General: He is not in acute distress.    Appearance: Normal appearance. He is well-developed.  HENT:     Head: Normocephalic and atraumatic.  Eyes:     Conjunctiva/sclera: Conjunctivae normal.  Cardiovascular:     Rate and Rhythm: Normal rate and regular rhythm.     Heart sounds: No murmur heard.   Pulmonary:     Effort: Pulmonary effort is normal. No respiratory distress.     Breath sounds: Normal breath sounds.  Abdominal:     Tenderness: There is abdominal tenderness (ruq). There is no rebound.  Musculoskeletal:        General: Normal range of motion.     Cervical back: Neck supple.     Comments: Right BKA, wound stapled closed.  No particular warmth and no drainage expressed.  Skin:    General: Skin  is warm and dry.  Capillary Refill: Capillary refill takes less than 2 seconds.  Neurological:     General: No focal deficit present.     Mental Status: He is alert.     Sensory: No sensory deficit.     Motor: No weakness.     ED Results / Procedures / Treatments   Labs (all labs ordered are listed, but only abnormal results are displayed) Labs Reviewed  COMPREHENSIVE METABOLIC PANEL - Abnormal; Notable for the following components:      Result Value   Sodium 132 (*)    Chloride 97 (*)    Glucose, Bld 314 (*)    BUN 64 (*)    Creatinine, Ser 2.68 (*)    Albumin 3.4 (*)    GFR calc non Af Amer 24 (*)    GFR calc Af Amer 28 (*)    All other components within normal limits  CBC WITH DIFFERENTIAL/PLATELET - Abnormal; Notable for the following components:   RBC 3.97 (*)    Hemoglobin 11.4 (*)    HCT 35.9 (*)    All other components within normal limits  URINALYSIS, ROUTINE W REFLEX MICROSCOPIC - Abnormal; Notable for the following components:   Glucose, UA >=500 (*)    Bacteria, UA RARE (*)    All other components within normal limits  CBC - Abnormal; Notable for the following components:   WBC 3.1 (*)    RBC 3.56 (*)    Hemoglobin 10.3 (*)    HCT 32.7 (*)    All other components within normal limits  COMPREHENSIVE METABOLIC PANEL - Abnormal; Notable for the following components:   Glucose, Bld 138 (*)    BUN 51 (*)    Creatinine, Ser 1.74 (*)    Calcium 8.7 (*)    Albumin 3.0 (*)    GFR calc non Af Amer 41 (*)    GFR calc Af Amer 47 (*)    All other components within normal limits  CBG MONITORING, ED - Abnormal; Notable for the following components:   Glucose-Capillary 103 (*)    All other components within normal limits  CULTURE, BLOOD (ROUTINE X 2)  CULTURE, BLOOD (ROUTINE X 2)  SARS CORONAVIRUS 2 BY RT PCR (HOSPITAL ORDER, [facility])  URINE CULTURE  LACTIC ACID, PLASMA  LIPASE, BLOOD  PROTIME-INR  TROPONIN I (HIGH SENSITIVITY)   TROPONIN I (HIGH SENSITIVITY)    EKG EKG Interpretation  Date/Time:  Monday January 17 2020 16:07:48 EDT Ventricular Rate:  64 PR Interval:    QRS Duration: 91 QT Interval:  434 QTC Calculation: 448 R Axis:   -19 Text Interpretation: Sinus rhythm Prolonged PR interval Left ventricular hypertrophy Anterior Q waves, possibly due to LVH No significant change since prior 7/21 Confirmed by Aletta Edouard [phone]) on 01/17/2020 4:14:54 PM   Radiology DG Chest Port 1 View  Result Date: 01/17/2020 CLINICAL DATA:  Weakness and hypotension EXAM: PORTABLE CHEST 1 VIEW COMPARISON:  06/20/2018 FINDINGS: Cardiac shadow is within normal limits. Aortic calcifications are noted. The lungs are clear. No acute bony abnormality is noted. IMPRESSION: No active disease. Electronically Signed   By: Inez Catalina M.D.   On: 01/17/2020 17:05   CT Renal Stone Study  Result Date: 01/17/2020 CLINICAL DATA:  Right flank pain, stone disease suspected EXAM: CT ABDOMEN AND PELVIS WITHOUT CONTRAST TECHNIQUE: Multidetector CT imaging of the abdomen and pelvis was performed following the standard protocol without IV contrast. COMPARISON:  CT 01/27/2019 FINDINGS: Lower chest: Lung bases  are clear. Normal heart size. No pericardial effusion. Calcifications on the aortic leaflets. Coronary artery calcifications as well. Mild bilateral gynecomastia. Hepatobiliary: Diffuse hepatic hypoattenuation compatible with hepatic steatosis. Sparing along the gallbladder fossa. No visible focal liver lesion. Smooth surface contour. Gallbladder and biliary tree are unremarkable. No visible calcified gallstones. Pancreas: Unremarkable. No pancreatic ductal dilatation or surrounding inflammatory changes. Spleen: Normal in size. No concerning splenic lesions. Adrenals/Urinary Tract: Normal adrenal glands. Kidneys are symmetric in size and normally located. No visible or concerning renal lesions. No visible urolithiasis or hydronephrosis. Physiologic  distension of the urinary bladder without acute or worrisome abnormality. Stomach/Bowel: Distal esophagus, stomach and duodenal sweep are unremarkable. No small bowel wall thickening or dilatation. A normal appendix is visualized. No colonic dilatation or wall thickening. Moderate colonic stool burden. No evidence of obstruction. Vascular/Lymphatic: Atherosclerotic calcifications within the abdominal aorta and branch vessels. No aneurysm or ectasia. Few prominent bilateral inguinal nodes with normal morphology, stable from priors as remote is 2010. No enlarged or enlarging adenopathy. Reproductive: The prostate and seminal vesicles are unremarkable. Other: No abdominopelvic free fluid or free gas. No bowel containing hernias. Small fat containing umbilical hernia. Mild soft tissue edema in the flanks and posterior soft tissues. Musculoskeletal: Multilevel degenerative changes in the spine with some sclerotic endplate features likely reflecting Modic type endplate changes unchanged from comparison exam. No acute or suspicious osseous lesions. Mild degenerative changes in the hips and pelvis as well. IMPRESSION: 1. Moderate colonic stool burden, could correlate for constipation. 2. No other acute intra-abdominal process to provide cause for patient's right flank pain, specifically no visible urolithiasis or hydronephrosis. 3. Hepatic steatosis. 4. Aortic Atherosclerosis (ICD10-I70.0). 5. Coronary artery calcifications are present. Please note that the presence of coronary artery calcium documents the presence of coronary artery disease, the severity of this disease and any potential stenosis cannot be assessed on this non-gated CT examination. Assessment for potential risk factor modification, dietary therapy or pharmacologic therapy may be warranted. Electronically Signed   By: Lovena Le M.D.   On: 01/17/2020 20:08   US Abdomen Limited RUQ  Result Date: 01/17/2020 CLINICAL DATA:  Right upper quadrant abdominal  pain. EXAM: ULTRASOUND ABDOMEN LIMITED RIGHT UPPER QUADRANT COMPARISON:  CT dated January 27, 2019 FINDINGS: Gallbladder: No gallstones or wall thickening visualized. No sonographic Murphy sign noted by sonographer. Common bile duct: Diameter: 6 mm Liver: Diffuse increased echogenicity with slightly heterogeneous liver. Appearance typically secondary to fatty infiltration. Fibrosis secondary consideration. No secondary findings of cirrhosis noted. No focal hepatic lesion or intrahepatic biliary duct dilatation. Portal vein is patent on color Doppler imaging with normal direction of blood flow towards the liver. Other: None. IMPRESSION: 1. No evidence for cholelithiasis or acute cholecystitis. 2. Hepatic steatosis. Electronically Signed   By: Constance Holster M.D.   On: 01/17/2020 19:21    Procedures Procedures (including critical care time)  Medications Ordered in ED Medications  acetaminophen (TYLENOL) tablet 1,000 mg (1,000 mg Oral Given 01/18/20 1100)  aspirin chewable tablet 81 mg (81 mg Oral Given 01/18/20 1100)  oxyCODONE (Oxy IR/ROXICODONE) immediate release tablet 5 mg (has no administration in time range)  insulin glargine (LANTUS) injection 15 Units (15 Units Subcutaneous Given 01/18/20 1104)  famotidine (PEPCID) tablet 20 mg (20 mg Oral Given 01/18/20 1100)  polyethylene glycol (MIRALAX / GLYCOLAX) packet 17 g (17 g Oral Given 01/18/20 1103)  senna (SENOKOT) tablet 8.6 mg (8.6 mg Oral Given 01/18/20 1059)  ticagrelor (BRILINTA) tablet 90 mg (90 mg Oral Given  01/18/20 1100)  cholecalciferol (VITAMIN D3) tablet 2,000 Units (2,000 Units Oral Given 01/18/20 1059)  albuterol (PROVENTIL) (2.5 MG/3ML) 0.083% nebulizer solution 2.5 mg (has no administration in time range)  enoxaparin (LOVENOX) injection 30 mg (30 mg Subcutaneous Given 01/17/20 2334)  insulin aspart (novoLOG) injection 0-9 Units (0 Units Subcutaneous Not Given 01/18/20 0839)  rosuvastatin (CRESTOR) tablet 10 mg (has no administration in  time range)  metoprolol tartrate (LOPRESSOR) 25 mg/10 mL oral suspension 6.25 mg (has no administration in time range)  sodium chloride 0.9 % bolus 500 mL (0 mLs Intravenous Stopped 01/17/20 1844)  sodium chloride 0.9 % bolus 1,000 mL (0 mLs Intravenous Stopped 01/17/20 2241)    ED Course  I have reviewed the triage vital signs and the nursing notes.  Pertinent labs & imaging results that were available during my care of the patient were reviewed by me and considered in my medical decision making (see chart for details).  Clinical Course as of Jan 17 1117  Mon Jan 17, 2020  1659 Chest x-ray interpreted by me as low lung volumes no gross infiltrates.   [MB]  9381 Cardiac echo 1/20 - Study Conclusions   - Left ventricle: The cavity size was normal. Wall thickness was  normal. Systolic function was vigorous. The estimated ejection  fraction was in the range of 65% to 70%. Wall motion was normal;  there were no regional wall motion abnormalities. Doppler  parameters are consistent with abnormal left ventricular  relaxation (grade 1 diastolic dysfunction). The E&'e&' ratio is  >15, suggesting elevated LV filling pressure.  - Aortic valve: Bicuspid aortic valve with thickened leaflets. No  significant stenosis. There was no regurgitation.  - Mitral valve: Mildly thickened leaflets . There was mild  regurgitation.  - Left atrium: The atrium was normal in size.  - Inferior vena cava: The vessel was dilated. The respirophasic  diameter changes were blunted (< 50%), consistent with elevated  central venous pressure.    [MB]  0175 Discussed with the family practice team will evaluate the patient for admission.   [MB]    Clinical Course User Index [MB] Hayden Rasmussen, MD   MDM Rules/Calculators/A&P                         This patient complains of low blood pressure, weakness, right upper quadrant pain; this involves an extensive number of treatment Options and is a  complaint that carries with it a high risk of complications and Morbidity. The differential includes sepsis, Sirs, anemia, cholecystitis, cholelithiasis, infected wound  I ordered, reviewed and interpreted labs, which included CBC with normal white count, low but stable hemoglobin, chemistries with elevated glucose elevated BUN and creatinine up from baseline, urinalysis negative, lactate not elevated, Covid testing negative, blood cultures drawn and pending I ordered medication IV fluids with improvement in blood pressure I ordered imaging studies which included chest x-ray and I independently    visualized and interpreted imaging which showed no acute infiltrates Additional history obtained from EMS Previous records obtained and reviewed in epic including last admission for right lower leg infection and subsequent BKA I consulted family practice resident team and discussed lab and imaging findings  Critical Interventions: None  After the interventions stated above, I reevaluated the patient and found patient's blood pressure to be trending the right direction.  Will need to be admitted for further hydration and continued work-up of his hypotension.   Final Clinical Impression(s) / ED  Diagnoses Final diagnoses:  RUQ abdominal pain  AKI (acute kidney injury) ([place])  Hypotension, unspecified hypotension type    Rx / DC Orders ED Discharge Orders    None       Hayden Rasmussen, MD 01/18/20 1121

## 2020-01-17 NOTE — ED Notes (Signed)
Patient transported to CT 

## 2020-01-17 NOTE — H&P (Addendum)
[facility] Admission History and Physical Service Pager: [phone]  Patient name: Adam Ray Medical record number: [id number] Date of birth: 1956-03-07 Age: 64 y.o. Gender: male  Primary Care Provider: [name], MD Consultants: None Code Status: Full Preferred Emergency Contact: [name], wife, [phone]  Chief Complaint: hypotension and abdominal pain  Assessment and Plan: MANDO BLATZ is a 64 y.o. male presenting with RUQ abdominal pain and AKI. 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  AKI likely 2/2 hypotension and decreased PO intake: Cr 2.68 (BL 1.3). K+ 4.5 Patient presented hypotensive, 94/59. No associated symptoms. Of note, patient took his Viagra today. Also in the setting of decreased PO intake and restarting Januvia and Lisinopril.  BUN/Cr ratio suggesting pre-renal cause. States he has not had an appetite since he was discharged on 7/17. This was also the last time patient reports eating and having BM. Creatinine on 7/15 was 1.34. Has received 1500 mL NS in ED, improving BP to 117/61.  On exam, patient appears volume up with 2+ pitting edema LLE to knee and trace sacral pitting edema.  CXR negative and patient denies shortness of breath.  Will hold off on further fluids given fluid status and history of HFpEF.  Of note, patient's Lasix was discontinued on discharge from hospital (had previously been taking 20mg  QD).  For now, do not think that we need to diurese as he is breathing comfortably, but should this change, would consider repeat CXR and diuresis.  Will monitor closely with strict I/O and daily weights.  Will assess Cr in AM as well. - Admit to medical telemetry, attending Dr. Owens Shark - vitals per flood protocol - s/p 1.5L NS, hold off on further fluids - CXR and possible Lasix if decline in respiratory  status - Strict I&O, monitor to keep urine output > 30 mL/hr - Daily weights - Avoid nephrotoxic agents, hold lisinopril, januvia - would not recommend continuing Viagra on discharge  Hypotension in setting of chronic HTN Home meds:  Lisinopril 5 mg, metoprolol tartrate 12.5 mg BID.  BP 94/59 on arrival.  Likely 2/2 decreased PO intake per above.  Patient also took viagra this AM which can also cause hypotension.  Suspect that both are contributing.  Did not meet SIRS criteria on presentation, LA 1.4, no evidence of infection with WBC 4.2 and afebrile.  Patient's surgical wound of RLE also without signs of infection.  BP improved s/p 1.5L fluids, 117/61.  Will continue to monitor closely. - holding lisinopril and metop - consider restarting metoprolol in AM if BP is stable overnight - would recommend discontinuing viagra on discharge given risk of hypotension - f/u blood cx and urine cx obtained in ED, but at this time, no indication for antibiotics  RUQ Abdominal Pain  Describes 7/10- 10/10 constant, stabbing pain that waxes and wanes. Associated nausea but no vomiting. Decreased PO intake last two days due to having no appetite. CT abdomen with evidence of moderate colonic stool burden. Last BM 7/17. Patient on Percocet for pain control for recent transtibial amputation. This is likely related to constipation. No evidence of renal stones and no urinary symptoms apart from decreased urinary frequency. No evidence for cholelithiasis or acute cholecystitis on RUQ U/S. Evidence of hepatic steatosis on imaging. Patient with history of alcohol abuse. AST/ALT, PT and INR WNL. Hepatomegaly not appreciated and no fluid wave on exam.  No signs of  infection, WBC WNL and afebrile.  CXR also negative for RLL pneumonia.  He does seem to have mildly distended abdomen on exam which could also lend to constipation, especially with no fluid wave appreciated. 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 Pain control at discharge: 5-325 Percocet. Follow up appointment with ortho scheduled for this week. No recent fevers, erythema, drainage to suggest infection. WBC 4.2. - PT/OT eval - Wound care consult - Fall precautions  - Scheduled Tylenol 1000 BID and oxycodone IR 5 mg q8h PRN severe pain  Type 2 Diabetes, insulin dependent  Last HgA1C 7.2 on 10/28/19. Patient reports "low" BG levels at home, but does not know specific readings. Glucose on admission: 314. - Hold home Jardiance 12.5 mg, Lantus 50 mg (patient reports only taking 30U) - Lantus 15U daily, increase as needed - sSSI with AC&HS glucose checks. - Hypoglycemia protocols ordered.  - Consider repeat HgbA1C   CAD, PAD Serial troponins 6 > 7. Negative CXR. Denies chest pain, SOB. EKG without acute changes. - continue ticagrelor, ASA PRN - holding prn Nitro as patient took Viagra today  HFpEF Chest x-ray negative for cardiomegaly, fluid. 2+ LLE to tibial tubercle and trace sacral edema on exam. Lungs CTAB. Echo on 07/13/2018 demonstrates EF of 65-70%. Grade 1 diastolic dysfunction, bicuspid aortic valve, mild MR/ - Lasix held on most recent discharge  - Consider repeat Echo  - Strict I&O, daily weights as above   CKD, stage II Baseline creatinine appears to be 1.3, 2.68 today. GFR 28, previously >60. BUN 64.  - Lasix discontinued at recent discharge.  - Holding Lisinopril due to hypotension - plan per above  Hx of EtOH abuse:  Reports drinking "a lot" of vodka. States last drink was before prior admission. Evidence of hepatic steatosis on CT. AST and ALT WNL. PT 13.5, INR 1. - CIWA daily  - Thiamine  983 mg daily and folic acid 1 mg daily on d/c  - Replete electrolytes PRN - CBC, CMP daily - Consider recommendation to AA on discharge  FEN/GI: Heart healthy/carb modified  Prophylaxis: Lovenox  Disposition: Admit to telemetry   History of Present Illness:  [name] is a 64 y.o. male presenting with right upper abdominal pain and AKI in the setting of recent discharge on 7/17 after right BKA following cellulitis of RLE.  Patient reports that since being discharged on 7/17, he has not been getting enough fluid and has been drinking less than usual.  States that he has been having RUQ pain that is coming and going, "but also there all the time."  Has been having this pain since being discharged on 7/17.  Rates pain 7/10 to 10/10.  Describes the pain as stabbing pain.  States that pain feels better when he sits up.  Laying down, makes the pain worse.  Endorses nausea.  No vomiting.  Has not had BM since Saturday, states that at that time it was soft.  He has been taking percocet for post-surgical pain.  Has not eaten since Saturday because he has not been hungry.  He has been drinking a little bit.  He has not been drinking any alcohol.  States that he  is currently feeling hungry.  States that he has been peeing less often than usual.  No dysuria, no hematuria.  States that urine is yellow.    Today, patient was seen by a [facility] RN and was found to be hypotensive, therefore was brought to the ED.  Patient reports that he has been taking all of his medications as prescribed.  States that CBGs have been running on the low end, but he is not checking them himself.  Continues to endorse some pain s/p right BKA, but states that it is stable.    Review Of Systems: Per HPI with the following additions:   Review of Systems  Constitutional: Positive for appetite change. Negative for chills, diaphoresis and fever.  HENT: Negative for congestion and sore throat.   Eyes: Negative for visual  disturbance.  Respiratory: Negative for cough and shortness of breath.   Cardiovascular: Negative for chest pain and leg swelling.  Gastrointestinal: Positive for abdominal pain, constipation and nausea. Negative for diarrhea and vomiting.  Genitourinary: Negative for dysuria, frequency and hematuria.  Musculoskeletal:       Right leg pain s/p BKA  Neurological: Negative for dizziness, syncope, weakness and headaches.     Patient Active Problem List   Diagnosis Date Noted  . Cutaneous abscess of right foot   . Cellulitis of right foot 01/01/2020  . Cellulitis 01/01/2020  . Nocturnal leg cramps 10/28/2019  . Hyperlipidemia 05/26/2019  . Edema leg 12/29/2018  . STEMI involving left circumflex coronary artery ([place]) 07/12/2018  . Status post transmetatarsal amputation of foot, right ([place]) 07/08/2018  . Limited mobility 05/12/2018  . Gingival foreign body 07/22/2017  . Anxiety state 06/02/2017  . Leg swelling 01/29/2017  . Idiopathic chronic venous hypertension of both lower extremities with inflammation 09/24/2016  . Testicular mass 04/18/2016  . Subacute osteomyelitis, right ankle and foot ([place]) 01/29/2016  . Diabetic polyneuropathy associated with type 2 diabetes mellitus ([place])   . Type 2 diabetes mellitus with neurologic complication, with long-term current use of insulin ([place]) 12/08/2015  . Pulmonary nodule 02/01/2015  . Depression 12/09/2013  . Warts, genital 08/20/2013  . 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 Diabetic retinopathy ([place]) 01/28/2013  . Sleep apnea 09/06/2010  . 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 Essential hypertension 11/09/2008  . COPD, mild ([place]) 10/06/2006  . SICKLE-CELL TRAIT 04/26/2006  . ERECTILE DYSFUNCTION 04/26/2006  . Tobacco abuse 04/26/2006  . PAD (peripheral artery disease) ([place]) 04/26/2006  . ALLERGIC RHINITIS 04/26/2006    Past Medical History: Past Medical History:  Diagnosis Date  . Allergy   . Arthritis    . Bronchitis   . Cataract   . Chronic kidney disease   . Claudication [facility])    right foot ray resection  . Colon polyps    hyperplastic  . COPD (chronic obstructive pulmonary disease) ([place])   . Diabetes mellitus   . Genital warts   . Gout   . Hyperlipidemia   . Hypertension   . Osteomyelitis of third toe of right foot ([place])   . Pneumonia   . Status post amputation of toe of right foot ([place]) 09/24/2016    Past Surgical History: Past Surgical History:  Procedure Laterality Date  . AMPUTATION Right 01/31/2016   Procedure: Right 2nd Toe Amputation;  Surgeon: Newt Minion, MD;  Location: [place];  Service: Orthopedics;  Laterality: Right;  . AMPUTATION Right 07/08/2018   Procedure: RIGHT TRANSMETATARSAL AMPUTATION;  Surgeon: Newt Minion, MD;  Location: [facility]  OR;  Service: Orthopedics;  Laterality: Right;  . AMPUTATION Right 01/05/2020   Procedure: RIGHT BELOW KNEE AMPUTATION;  Surgeon: Newt Minion, MD;  Location: [place];  Service: Orthopedics;  Laterality: Right;  . CATARACT EXTRACTION     right eye  . COLONOSCOPY    . CORONARY STENT INTERVENTION N/A 07/12/2018   Procedure: CORONARY STENT INTERVENTION;  Surgeon: Troy Sine, MD;  Location: [place] CV LAB;  Service: Cardiovascular;  Laterality: N/A;  . CORONARY/GRAFT ACUTE MI REVASCULARIZATION N/A 07/12/2018   Procedure: Coronary/Graft Acute MI Revascularization;  Surgeon: Troy Sine, MD;  Location: [place] CV LAB;  Service: Cardiovascular;  Laterality: N/A;  . I & D EXTREMITY  04/11/2012   Procedure: IRRIGATION AND DEBRIDEMENT EXTREMITY;  Surgeon: Wylene Simmer, MD;  Location: [place];  Service: Orthopedics;  Laterality: Right;  . LEFT HEART CATH AND CORONARY ANGIOGRAPHY N/A 07/12/2018   Procedure: LEFT HEART CATH AND CORONARY ANGIOGRAPHY;  Surgeon: Troy Sine, MD;  Location: [place] CV LAB;  Service: Cardiovascular;  Laterality: N/A;  . Surgery left great toe    . Tear ducts bilateral eyes    . TRANSMETATARSAL  AMPUTATION Right 07/08/2018    Social History: Social History   Tobacco Use  . Smoking status: Current Every Day Smoker    Packs/day: 0.30    Years: 48.00    Pack years: 14.40    Types: Cigars, Cigarettes    Start date: 07/02/1963  . Smokeless tobacco: Former Systems developer  . Tobacco comment: wants to use electric cigarettes.  not interested in pills, worried about chantix side effects  Vaping Use  . Vaping Use: Former  Substance Use Topics  . Alcohol use: Yes    Alcohol/week: 0.0 standard drinks    Comment: occassional use  . Drug use: No   Additional social history:   Please also refer to relevant sections of EMR.  Family History: Family History  Problem Relation Age of Onset  . Diabetes Mother   . Stroke Mother   . Heart failure Father   . Colon cancer Neg Hx   . Esophageal cancer Neg Hx   . Rectal cancer Neg Hx   . Stomach cancer Neg Hx     Allergies and Medications: Allergies  Allergen Reactions  . Codeine Other (See Comments)    Heart attack.   No current facility-administered medications on file prior to encounter.   Current Outpatient Medications on File Prior to Encounter  Medication Sig Dispense Refill  . acetaminophen (TYLENOL) 500 MG tablet Take 500 mg by mouth 2 (two) times daily as needed for headache (pain).     [name] albuterol (VENTOLIN HFA) 108 (90 Base) MCG/ACT inhaler Inhale 2 puffs into the lungs every 4 (four) hours as needed for wheezing or shortness of breath. 18 g 2  . aspirin 81 MG chewable tablet Chew 81 mg by mouth daily.    . baclofen (LIORESAL) 10 MG tablet Take 10 mg by mouth 2 (two) times daily as needed for muscle spasms.    [name] CALCIUM PO Take 1 tablet by mouth daily.    . Cholecalciferol (VITAMIN D3) 50 MCG (2000 UT) TABS Take 2,000 Units by mouth daily.    . empagliflozin (JARDIANCE) 25 MG TABS tablet Take 12.5 mg by mouth daily.    . famotidine (PEPCID) 20 MG tablet Take 1 tablet (20 mg total) by mouth 2 (two) times daily. (Patient taking  differently: Take 20 mg by mouth 2 (two) times daily as  needed for heartburn or indigestion. ) 60 tablet 1  . insulin glargine (LANTUS) 100 UNIT/ML injection Inject 0.5 mLs (50 Units total) into the skin daily. 20 mL 0  . lisinopril (ZESTRIL) 5 MG tablet Take 5 mg by mouth daily.    . metoprolol tartrate (LOPRESSOR) 25 MG tablet TAKE 0.5 TABLETS (12.5 MG TOTAL) BY MOUTH 2 (TWO) TIMES DAILY. 90 tablet 1  . Multiple Vitamin (MULTIVITAMIN WITH MINERALS) TABS tablet Take 1 tablet by mouth daily.    . nitroGLYCERIN (NITROSTAT) 0.4 MG SL tablet Place 1 tablet (0.4 mg total) under the tongue every 5 (five) minutes x 3 doses as needed for chest pain. 25 tablet 11  . oxyCODONE-acetaminophen (PERCOCET) 5-325 MG tablet Take 1 tablet by mouth every 8 (eight) hours as needed for severe pain. 10 tablet 0  . polyethylene glycol (MIRALAX / GLYCOLAX) 17 g packet Take 17 g by mouth daily. 14 each 0  . rosuvastatin (CRESTOR) 40 MG tablet Take 20 mg by mouth daily.    . sildenafil (VIAGRA) 100 MG tablet Take 100 mg by mouth daily as needed for erectile dysfunction.    . ticagrelor (BRILINTA) 90 MG TABS tablet Take 1 tablet (90 mg total) by mouth 2 (two) times daily. 60 tablet 11  . [DISCONTINUED] levofloxacin (LEVAQUIN) 750 MG tablet Take 1 tablet (750 mg total) by mouth daily. For 14 days 14 tablet 0    Objective: BP (!) 104/59   Pulse 63   Temp 98.2 F (36.8 C) (Oral)   Resp (!) 22   Ht 5\' 10"  (1.778 m)   Wt 77.1 kg   SpO2 100%   BMI 24.39 kg/m  Exam: General: Awake, alert, no acute distress.  Eyes: EOMI, no icterus  ENTM: airway patent Neck: supple Cardiovascular: RRR, difficult to assess L pedal pulses, 2+ radial pulses. 2+ pitting edema LLE to tibial tubercle, trace pitting edema of sacrum Respiratory: CTAB Gastrointestinal: RUQ ttp, mild distension, bowel sounds present, no hepatomegaly or splenomegaly appreciated MSK: Right BKA, moving extremities spontaneously Derm: right BKA with staples, no  erythema or drainage noted, see below, BLE warm and well-perfused Neuro: answers questions appropriately, oriented  Psych: pleasant, flat affect      Labs and Imaging: CBC BMET  Recent Labs  Lab 01/17/20 1614  WBC 4.2  HGB 11.4*  HCT 35.9*  PLT 196   Recent Labs  Lab 01/17/20 1614  NA 132*  K 4.5  CL 97*  CO2 23  BUN 64*  CREATININE 2.68*  GLUCOSE 314*  CALCIUM 9.2     EKG: NSR 64, LVH  CT Renal Stone Study 7/19 IMPRESSION: 1. Moderate colonic stool burden, could correlate for constipation. 2. No other acute intra-abdominal process to provide cause for patient's right flank pain, specifically no visible urolithiasis or hydronephrosis. 3. Hepatic steatosis. 4. Aortic Atherosclerosis (ICD10-I70.0). 5. Coronary artery calcifications are present. Please note that the presence of coronary artery calcium documents the presence of coronary artery disease, the severity of this disease and any potential stenosis cannot be assessed on this non-gated CT examination. Assessment for potential risk factor modification, dietary therapy or pharmacologic therapy may be warranted.  U/S Abdomen Limited RUQ 7/19 IMPRESSION: 1. No evidence for cholelithiasis or acute cholecystitis. 2. Hepatic steatosis.  1-View Chest X-ray 7/19 IMPRESSION: No active disease.  Sharion Settler, DO 01/17/2020, 7:59 PM PGY-1, [place] Intern pager: [phone], text pages welcome  FPTS Upper-Level Resident Addendum   I have independently interviewed and examined  the patient. I have discussed the above with the original author and agree with their documentation. My edits for correction/addition/clarification are in green. Please see also any attending notes.   [place] Constable, D.O. PGY-3, [facility] 01/17/2020 11:02 PM  [place] Service pager: [phone] (text pages welcome through [facility])

## 2020-01-17 NOTE — ED Triage Notes (Signed)
Pt arrived via [facility] from home. Pt recently had a right bka performed. Home health nurse reported low bp today.

## 2020-01-18 ENCOUNTER — Telehealth: Payer: Self-pay | Admitting: Family Medicine

## 2020-01-18 ENCOUNTER — Encounter (HOSPITAL_COMMUNITY): Payer: Self-pay | Admitting: Family Medicine

## 2020-01-18 DIAGNOSIS — Z720 Tobacco use: Secondary | ICD-10-CM

## 2020-01-18 DIAGNOSIS — E1122 Type 2 diabetes mellitus with diabetic chronic kidney disease: Secondary | ICD-10-CM | POA: Diagnosis present

## 2020-01-18 DIAGNOSIS — K760 Fatty (change of) liver, not elsewhere classified: Secondary | ICD-10-CM | POA: Diagnosis present

## 2020-01-18 DIAGNOSIS — E785 Hyperlipidemia, unspecified: Secondary | ICD-10-CM | POA: Diagnosis present

## 2020-01-18 DIAGNOSIS — Z794 Long term (current) use of insulin: Secondary | ICD-10-CM | POA: Diagnosis not present

## 2020-01-18 DIAGNOSIS — I739 Peripheral vascular disease, unspecified: Secondary | ICD-10-CM

## 2020-01-18 DIAGNOSIS — N182 Chronic kidney disease, stage 2 (mild): Secondary | ICD-10-CM | POA: Diagnosis present

## 2020-01-18 DIAGNOSIS — F411 Generalized anxiety disorder: Secondary | ICD-10-CM | POA: Diagnosis present

## 2020-01-18 DIAGNOSIS — I10 Essential (primary) hypertension: Secondary | ICD-10-CM

## 2020-01-18 DIAGNOSIS — E1149 Type 2 diabetes mellitus with other diabetic neurological complication: Secondary | ICD-10-CM | POA: Diagnosis present

## 2020-01-18 DIAGNOSIS — R1011 Right upper quadrant pain: Secondary | ICD-10-CM | POA: Diagnosis present

## 2020-01-18 DIAGNOSIS — I5032 Chronic diastolic (congestive) heart failure: Secondary | ICD-10-CM | POA: Diagnosis present

## 2020-01-18 DIAGNOSIS — Z89511 Acquired absence of right leg below knee: Secondary | ICD-10-CM | POA: Diagnosis not present

## 2020-01-18 DIAGNOSIS — Z7902 Long term (current) use of antithrombotics/antiplatelets: Secondary | ICD-10-CM | POA: Diagnosis not present

## 2020-01-18 DIAGNOSIS — I2510 Atherosclerotic heart disease of native coronary artery without angina pectoris: Secondary | ICD-10-CM | POA: Diagnosis present

## 2020-01-18 DIAGNOSIS — K5900 Constipation, unspecified: Secondary | ICD-10-CM | POA: Diagnosis present

## 2020-01-18 DIAGNOSIS — Z20822 Contact with and (suspected) exposure to covid-19: Secondary | ICD-10-CM | POA: Diagnosis present

## 2020-01-18 DIAGNOSIS — F1021 Alcohol dependence, in remission: Secondary | ICD-10-CM | POA: Diagnosis present

## 2020-01-18 DIAGNOSIS — N179 Acute kidney failure, unspecified: Principal | ICD-10-CM

## 2020-01-18 DIAGNOSIS — I959 Hypotension, unspecified: Secondary | ICD-10-CM | POA: Diagnosis present

## 2020-01-18 DIAGNOSIS — I130 Hypertensive heart and chronic kidney disease with heart failure and stage 1 through stage 4 chronic kidney disease, or unspecified chronic kidney disease: Secondary | ICD-10-CM | POA: Diagnosis present

## 2020-01-18 DIAGNOSIS — I252 Old myocardial infarction: Secondary | ICD-10-CM | POA: Diagnosis not present

## 2020-01-18 DIAGNOSIS — E1142 Type 2 diabetes mellitus with diabetic polyneuropathy: Secondary | ICD-10-CM | POA: Diagnosis present

## 2020-01-18 DIAGNOSIS — E113399 Type 2 diabetes mellitus with moderate nonproliferative diabetic retinopathy without macular edema, unspecified eye: Secondary | ICD-10-CM | POA: Diagnosis present

## 2020-01-18 DIAGNOSIS — H547 Unspecified visual loss: Secondary | ICD-10-CM | POA: Diagnosis present

## 2020-01-18 DIAGNOSIS — R531 Weakness: Secondary | ICD-10-CM | POA: Diagnosis present

## 2020-01-18 DIAGNOSIS — Q231 Congenital insufficiency of aortic valve: Secondary | ICD-10-CM | POA: Diagnosis not present

## 2020-01-18 DIAGNOSIS — H538 Other visual disturbances: Secondary | ICD-10-CM | POA: Diagnosis present

## 2020-01-18 LAB — URINE CULTURE: Culture: <10000 — AB

## 2020-01-18 LAB — COMPREHENSIVE METABOLIC PANEL
ALT: 26 U/L (ref 0–44)
AST: 24 U/L (ref 15–41)
Albumin: 3 g/dL — ABNORMAL LOW (ref 3.5–5.0)
Alkaline Phosphatase: 84 U/L (ref 38–126)
Anion gap: 7 (ref 5–15)
BUN: 51 mg/dL — ABNORMAL HIGH (ref 8–23)
CO2: 23 mmol/L (ref 22–32)
Calcium: 8.7 mg/dL — ABNORMAL LOW (ref 8.9–10.3)
Chloride: 108 mmol/L (ref 98–111)
Creatinine, Ser: 1.74 mg/dL — ABNORMAL HIGH (ref 0.61–1.24)
GFR calc Af Amer: 47 mL/min — ABNORMAL LOW (ref >60)
GFR calc non Af Amer: 41 mL/min — ABNORMAL LOW (ref >60)
Glucose, Bld: 138 mg/dL — ABNORMAL HIGH (ref 70–99)
Potassium: 4.2 mmol/L (ref 3.5–5.1)
Sodium: 138 mmol/L (ref 135–145)
Total Bilirubin: 0.8 mg/dL (ref 0.3–1.2)
Total Protein: 6.8 g/dL (ref 6.5–8.1)

## 2020-01-18 LAB — CBG MONITORING, ED
Glucose-Capillary: 103 mg/dL — ABNORMAL HIGH (ref 70–99)
Glucose-Capillary: 194 mg/dL — ABNORMAL HIGH (ref 70–99)
Glucose-Capillary: 215 mg/dL — ABNORMAL HIGH (ref 70–99)

## 2020-01-18 LAB — CBC
HCT: 32.7 % — ABNORMAL LOW (ref 39.0–52.0)
Hemoglobin: 10.3 g/dL — ABNORMAL LOW (ref 13.0–17.0)
MCH: 28.9 pg (ref 26.0–34.0)
MCHC: 31.5 g/dL (ref 30.0–36.0)
MCV: 91.9 fL (ref 80.0–100.0)
Platelets: 167 10*3/uL (ref 150–400)
RBC: 3.56 MIL/uL — ABNORMAL LOW (ref 4.22–5.81)
RDW: 13.3 % (ref 11.5–15.5)
WBC: 3.1 10*3/uL — ABNORMAL LOW (ref 4.0–10.5)
nRBC: 0 % (ref 0.0–0.2)

## 2020-01-18 LAB — GLUCOSE, CAPILLARY: Glucose-Capillary: 184 mg/dL — ABNORMAL HIGH (ref 70–99)

## 2020-01-18 MED ORDER — MELATONIN 3 MG PO TABS
3.0000 mg | ORAL_TABLET | Freq: Every evening | ORAL | Status: DC | PRN
  Administered 2020-01-18: 3 mg via ORAL
  Filled 2020-01-18: qty 1

## 2020-01-18 MED ORDER — METOPROLOL TARTRATE 25 MG/10 ML ORAL SUSPENSION
6.2500 mg | Freq: Two times a day (BID) | ORAL | Status: DC
  Administered 2020-01-18 – 2020-01-19 (×3): 6.25 mg via ORAL
  Filled 2020-01-18 (×4): qty 5

## 2020-01-18 MED ORDER — INSULIN ASPART 100 UNIT/ML ~~LOC~~ SOLN: 0.0000 [IU] | Freq: Every day | SUBCUTANEOUS | Status: DC

## 2020-01-18 MED ORDER — INSULIN ASPART 100 UNIT/ML ~~LOC~~ SOLN
0.0000 [IU] | Freq: Three times a day (TID) | SUBCUTANEOUS | Status: DC
  Administered 2020-01-19 (×2): 2 [IU] via SUBCUTANEOUS

## 2020-01-18 MED ORDER — PNEUMOCOCCAL VAC POLYVALENT 25 MCG/0.5ML IJ INJ: 0.5000 mL | INJECTION | INTRAMUSCULAR | Status: DC | PRN

## 2020-01-18 MED ORDER — ENOXAPARIN SODIUM 40 MG/0.4ML ~~LOC~~ SOLN
40.0000 mg | Freq: Every day | SUBCUTANEOUS | Status: DC
  Administered 2020-01-18: 40 mg via SUBCUTANEOUS
  Filled 2020-01-18: qty 0.4

## 2020-01-18 MED ORDER — ROSUVASTATIN CALCIUM 5 MG PO TABS
10.0000 mg | ORAL_TABLET | Freq: Every day | ORAL | Status: DC
  Administered 2020-01-18: 10 mg via ORAL
  Filled 2020-01-18 (×2): qty 2

## 2020-01-18 NOTE — Consult Note (Signed)
WOC Nurse Consult Note: Reason for Consult: Right BKA incision line. Photo uploaded to the EMR in ED yesterday. Surgery 01/05/20 (Dr. Sharol Given).  Patient to have seen Dr. Sharol Given next week. Recommend patient keep scheduled appointment or if hospitalized, to reschedule and see Dr.Duda as soon as possible following discharge. Wound type: Surgical Pressure Injury POA: N/A Measurement:  Wound bed: Closely approximated for time since surgery. A few areas of wider opening, no dehiscence. Dried serum, scabbing. Drainage (amount, consistency, odor) Scant, light yellow Periwound:Mild edema, no erythema or warmth.  Dressing procedure/placement/frequency: I have provided Nursing with guidance for topical care of the right BKA incision which is not the reason for admission. Patient to follow up with Dr. Sharol Given next week and is advised to keep that appointment unless hospitalized, in which case he should make/keep appointment at the earliest opportunity. Topical care will be with nonadherent antimicrobial (Xeroform) with Kerlix roll gauze/paper tape to secure. Changes are twice daily following soap and water cleanse.  [facility] nursing team will not follow, but will remain available to this patient, the nursing and medical teams.  Please re-consult if needed. Thanks, Maudie Flakes, MSN, RN, [place], Ar[facility]  Pager# [phone]

## 2020-01-18 NOTE — ED Notes (Signed)
Dinner Tray Ordered @ 1709. 

## 2020-01-18 NOTE — Evaluation (Signed)
Occupational Therapy Evaluation Patient Details Name: Adam Ray MRN: [id number] DOB: 01/10/1956 Today's Date: 01/18/2020    History of Present Illness 64 y.o. male presenting with RUQ abdominal pain and AKI. 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   Clinical Impression   PT admitted with R UQ pain. Pt currently with functional limitiations due to the deficits listed below (see OT problem list). Pt min guard (A) with RW for basic transfers. Pt does not have limb guard at this time and higher risk of injury to R LE. If pt is admitted consider ordering limb protector from [employer].  Pt will benefit from skilled OT to increase their independence and safety with adls and balance to allow discharge home.     Follow Up Recommendations  Home health OT;Other (comment) (home aide RN)    Equipment Recommendations  None recommended by OT    Recommendations for Other Services       Precautions / Restrictions Precautions Precautions: Fall Restrictions Weight Bearing Restrictions: Yes RLE Weight Bearing: Non weight bearing      Mobility Bed Mobility Overal bed mobility: Needs Assistance Bed Mobility: Supine to Sit;Sit to Supine     Supine to sit: Min guard Sit to supine: Min assist      Transfers Overall transfer level: Needs assistance Equipment used: Rolling walker (2 wheeled) Transfers: Sit to/from [employer] Sit to Stand: Min guard Stand pivot transfers: Min guard       General transfer comment: good hand placement and ability to power up .    Balance                                           ADL either performed or assessed with clinical judgement   ADL Overall ADL's : Needs assistance/impaired Eating/Feeding: Modified independent   Grooming: Supervision/safety;Sitting                   Toilet  Transfer: Min guard;BSC;Stand-pivot;RW             General ADL Comments: pt able to progress from bed to bedside commode. pt with pending effort to void bowels to determine if constipation is part of R UQ     Vision Baseline Vision/History: No visual deficits       Perception     Praxis      Pertinent Vitals/Pain Pain Assessment: No/denies pain     Hand Dominance Left   Extremity/Trunk Assessment Upper Extremity Assessment Upper Extremity Assessment: Generalized weakness   Lower Extremity Assessment Lower Extremity Assessment: Defer to PT evaluation;RLE deficits/detail RLE Deficits / Details: BKA with staples present   Cervical / Trunk Assessment Cervical / Trunk Assessment: Normal   Communication Communication Communication: No difficulties   Cognition Arousal/Alertness: Awake/alert Behavior During Therapy: WFL for tasks assessed/performed Overall Cognitive Status: Impaired/Different from baseline                                 General Comments: pt with incontinence noted in bed and wet gown but does not alert staff. OT mentioned the bed and pt still does not express to staff his gown is wet.    General Comments  Pt without limb guard at this time. When asked about equipment wife  reports its at home. upon further questioning pt has not been wearing it after d/c. Wife reports i told him. pt very minimal responses and relies on wife for medication now at home. next session need to evaluate cognition further for executive functional deficits    Exercises     Shoulder Instructions      Home Living Family/patient expects to be discharged to:: Private residence Living Arrangements: Spouse/significant other Available Help at Discharge: Family Type of Home: House Home Access: Stairs to enter [employer] of Steps: 1, 6 inch step Entrance Stairs-Rails: None Home Layout: One level     Bathroom Shower/Tub: Engineer, site: Standard     Home Equipment: Environmental consultant - 2 wheels (limb guard protector)          Prior Functioning/Environment Level of Independence: Needs assistance  Gait / Transfers Assistance Needed: using RW ADL's / Homemaking Assistance Needed: spouse (A) with bathing/ dressing and medication management. wife expressed concerns over medication and does not feel comfortable giving insulin at this time. pt sleeps in recliner at home not a bed            OT Problem List: Decreased strength;Decreased activity tolerance;Impaired balance (sitting and/or standing);Decreased safety awareness;Decreased knowledge of use of DME or AE;Pain;Decreased cognition      OT Treatment/Interventions: Self-care/ADL training;Therapeutic exercise;Energy conservation;DME and/or AE instruction;Therapeutic activities;Patient/family education;Balance training;Cognitive remediation/compensation    OT Goals(Current goals can be found in the care plan section) Acute Rehab OT Goals Patient Stated Goal: wife wants rehab for patient due to concerns over medication management. patient wants to return home OT Goal Formulation: With patient/family Time For Goal Achievement: 02/01/20 Potential to Achieve Goals: Good  OT Frequency: Min 2X/week   Barriers to D/C:            Co-evaluation              AM-PAC OT "6 Clicks" Daily Activity     Outcome Measure Help from another person eating meals?: None Help from another person taking care of personal grooming?: A Little Help from another person toileting, which includes using toliet, bedpan, or urinal?: A Little Help from another person bathing (including washing, rinsing, drying)?: A Little Help from another person to put on and taking off regular upper body clothing?: None Help from another person to put on and taking off regular lower body clothing?: A Little 6 Click Score: 20   End of Session Equipment Utilized During Treatment: Rolling walker;Gait  belt Nurse Communication: Mobility status;Precautions;Weight bearing status  Activity Tolerance: Patient tolerated treatment well Patient left: in bed;with call bell/phone within reach;with family/visitor present  OT Visit Diagnosis: Unsteadiness on feet (R26.81);Other abnormalities of gait and mobility (R26.89);Pain Pain - Right/Left: Right Pain - part of body: Leg                Time: 1040-1116 OT Time Calculation (min): 36 min Charges:  OT General Charges $OT Visit: 1 Visit OT Evaluation $OT Eval Moderate Complexity: 1 Mod OT Treatments $Self Care/Home Management : 8-22 mins   Brynn, OTR/L  Acute Rehabilitation Services Pager: [phone] Office: [phone] .   Jeri Modena 01/18/2020, 11:34 AM

## 2020-01-18 NOTE — Progress Notes (Signed)
01/18/20 1500  PT Visit Information  Last PT Received On 01/18/20  Assistance Needed +1 (+2 for chair follow for mobility progression)  History of Present Illness 64 y.o. male presenting with RUQ abdominal pain and AKI. 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  Precautions  Precautions Fall  Restrictions  Weight Bearing Restrictions Yes  RLE Weight Bearing NWB  Other Position/Activity Restrictions recent [name] expects to be discharged to: Private residence  Living Arrangements Spouse/significant other  Available Help at Discharge Family  Type of [place] to enter  Entrance Stairs-Number of Steps 1, 6 inch step  Entrance Stairs-Rails None  Home Layout One level  Bathroom Shower/Tub Tub/shower unit  Tax adviser - 2 wheels;Wheelchair - manual;BSC (limb Catering manager)  Prior Function  Level of Independence Needs assistance  Gait / Transfers Assistance Needed using RW for transfers and w/c for primary mobility  ADL's / [place] spouse assists with bathing/ dressing and medication management.  Communication  Communication No difficulties  Pain Assessment  Pain Assessment No/denies pain  Cognition  Arousal/Alertness Awake/alert  Behavior During Therapy WFL for tasks assessed/performed  Overall Cognitive Status Impaired/Different from baseline  Area of Impairment Problem solving  Problem Solving Slow processing  General Comments pt slow to process throughout mobility tasks  Upper Extremity Assessment  Upper Extremity Assessment Defer to OT evaluation  Lower Extremity Assessment  Lower Extremity Assessment RLE deficits/detail  RLE Deficits / Details BKA with staples present, mild hamstring tightness noted.   Cervical / Trunk Assessment  Cervical /  Trunk Assessment Normal  Bed Mobility  Overal bed mobility Needs Assistance  Bed Mobility Supine to Sit;Sit to Supine  Supine to sit Supervision  Sit to supine Supervision  General bed mobility comments pt found reclining in bed, required use of bed rails with increased time to perform bed mobility  Transfers  Overall transfer level Needs assistance  Equipment used Rolling walker (2 wheeled)  Transfers Sit to/from [employer] Transfers  Sit to Stand Min guard;Min assist  Stand pivot transfers Min guard  General transfer comment pt had proper hand placement for sit<>stand transfer, required min assist for sit<>stand tranfer from [facility] otherwise required min gaurd for safety, pt also cued for upright posture in standing  Balance  Overall balance assessment Needs assistance  Sitting-balance support Bilateral upper extremity supported;Feet supported (single LE supported )  Sitting balance-Leahy Scale Poor  Standing balance support Bilateral upper extremity supported;During functional activity  Standing balance-Leahy Scale Poor  Standing balance comment reliant on RW for support  General Comments  General comments (skin integrity, edema, etc.) pt noted to have mild hamstring tightness on RLE and was educated on contracture prevention. pt also stated that he had left his limb protector at home   Exercises  Exercises Amputee  Amputee Exercises  Knee Extension PROM;Right;5 reps;Seated  PT - End of Session  Equipment Utilized During Treatment Gait belt  Activity Tolerance Patient tolerated treatment well  Patient left in bed;with call Talasia Saulter/phone within reach;with family/visitor present  Nurse Communication Mobility status  PT Assessment  PT Recommendation/Assessment Patient needs continued PT services  PT Visit Diagnosis Unsteadiness on feet (R26.81);Other abnormalities of gait and mobility (R26.89);Muscle weakness (generalized) (M62.81);Difficulty in walking, not elsewhere classified  (R26.2)  PT Problem List Decreased strength;Decreased range of motion;Decreased balance;Decreased activity tolerance;Decreased mobility;Decreased coordination;Decreased knowledge  of use of DME;Decreased safety awareness;Decreased knowledge of precautions  PT Plan  PT Frequency (ACUTE ONLY) Min 3X/week  PT Treatment/Interventions (ACUTE ONLY) DME instruction;Gait training;Stair training;Functional mobility training;Therapeutic activities;Therapeutic exercise;Balance training;Neuromuscular re-education;Patient/family education;Wheelchair mobility training  AM-PAC PT "6 Clicks" Mobility Outcome Measure (Version 2)  Help needed turning from your back to your side while in a flat bed without using bedrails? 4  Help needed moving from lying on your back to sitting on the side of a flat bed without using bedrails? 3  Help needed moving to and from a bed to a chair (including a wheelchair)? 3  Help needed standing up from a chair using your arms (e.g., wheelchair or bedside chair)? 3  Help needed to walk in hospital room? 2  Help needed climbing 3-5 steps with a railing?  2  6 Click Score 17  Consider Recommendation of Discharge To: [facility] with [facility]  PT Recommendation  Follow Up Recommendations Home health PT;Supervision for mobility/OOB  PT equipment None recommended by PT  Individuals Consulted  Consulted and Agree with Results and Recommendations Family member/caregiver;Patient  Family Member Consulted spouse  Acute Rehab PT Goals  Patient Stated Goal wife wants rehab for patient due to concerns over medication management. patient wants to return home  PT Goal Formulation With patient/family  Time For Goal Achievement 02/01/20  Potential to Achieve Goals Good  PT Time Calculation  PT Start Time (ACUTE ONLY) 1431  PT Stop Time (ACUTE ONLY) 1448  PT Time Calculation (min) (ACUTE ONLY) 17 min  PT General Charges  $$ ACUTE PT VISIT 1 Visit  PT Evaluation  $PT Eval Moderate Complexity 1 Mod   Written Expression  Dominant Hand Left    Clinical Impression: Pt admitted to the hospital for the above diagnosis and noted mobility impairments listed above. Pt was assessed today requiring supervision to min A for all functional tasks. Pt requires RW and w/c to be functional within the home and spouse can assist full time within the home. Pt was educated on amputee contracture prevention. Pt would continue benefit from skilled therapy to return him to PLOF and ensure his safety with mobility and functional tasks.    Gloriann Loan, SPT  Acute Rehabilitation Services  Office: [phone] 01/18/2020

## 2020-01-18 NOTE — Telephone Encounter (Signed)
Patient is wanting Doctor to spot by the hospital and see him.

## 2020-01-18 NOTE — Telephone Encounter (Signed)
Pt states he need to see Dr. [name]; He is in the emergency room  / Admitted Room 44   Ph # [phone]

## 2020-01-18 NOTE — Progress Notes (Signed)
Family Medicine Teaching Service Daily Progress Note Intern Pager: [phone]  Patient name: Adam Ray Medical record number: [id number] Date of birth: [birth year] Age: 64 y.o. Gender: male  Primary Care Provider: [name], MD Consultants: None Code Status: Full Code  Pt Overview and Major Events to Date:  Admitted 01/17/2020  Assessment and Plan: Adam Ray is a 64 y.o. male presenting with RUQ abdominal pain and AKI. 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  AKI likely 2/2 hypotension and decreased PO intake:  Cr 1.74 today, improved from Cr. 2.68 yesterday on admission. AKI likely secondary to hypotension and decreased PO intake. Patient states he has not had much oral intake since discharged home last Saturday.  - Strict I&O, monitor to keep urine output > 30 mL/hr - Daily weights - Avoid nephrotoxic agents, hold lisinopril, januvia  Hypotension in setting of chronic HTN Home meds include Lisinopril 5 mg, metoprolol tartrate 12.5 mg BID.  BP 94/59 on arrival.  BP throughout 92/58>110/66>104/63. Likely 2/2 decreased PO intake per above.  Patient also took viagra on the morning of admission which can also cause hypotension.  LA 1.4, no evidence of infection with WBC 4.2>3.1 (today) and afebrile.  Patient's surgical wound of RLE also without signs of infection.  BP improved s/p 1.5L fluids, 117/61.  Will continue to monitor closely. - holding lisinopril  -Start metoprolol 6.25 mg  - would recommend discontinuing viagra on discharge given risk of hypotension - awaiting blood cx and urine cultures  Abdominal Pain  Describes RUQ 7/10 compared to 10/10 on admission. Denies nausea, vomting or associated symptoms. Decreased PO intake last two days due to having no appetite. CT abdomen with evidence of moderate colonic stool burden. Last BM 7/17 on the day  of discharge, patient not sure if he had a BM this morning. Patient on Percocet for pain control for recent transtibial amputation. This is likely related to constipation. No evidence of renal stones and no urinary symptoms apart from decreased urinary frequency. No evidence for cholelithiasis or acute cholecystitis on RUQ U/S. Evidence of hepatic steatosis on imaging. Patient with history of alcohol abuse. AST/ALT, PT and INR WNL. Hepatomegaly not appreciated and no fluid wave on exam.  No signs of infection, WBC WNL and afebrile.  CXR also negative for RLL pneumonia.   - Bowel regimen to include senna, Miralax scheduled, especially while taking narcotics - monitor vitals per floor protocol  - Encourage fluid intake, 1.5 L daily  - consider enema if no adequate response from bowel regimen   S/p righttranstibial amputation (01/06/20)- stable Recently discharged with home health 7/17 s/p right transtibial amputation following cellulitis of RLE. Pain control at discharge: 5-325 Percocet. Follow up appointment with ortho scheduled for this week. No recent fevers, erythema, drainage to suggest infection. WBC 3.1 - PT/OT eval - Wound care consult - Fall precautions  - Scheduled Tylenol 1000 BID and oxycodone IR 5 mg q8h PRN severe pain  Type 2 Diabetes, insulin dependent Last HgA1C 7.2 on 10/28/19.  CBG 103 from 142. Blood glucose 138 from 314. - Hold home Jardiance 12.5 mg, Lantus 50 mg (patient reports only taking 30U) - Continue Lantus 15U daily - sSSI with AC&HS glucose checks. - Hypoglycemia protocols ordered.  - Consider repeat HgbA1C   CAD, PAD Serial troponins 6 > 7. Negative CXR. Denies chest pain, SOB. EKG without acute changes. - continue ticagrelor, ASA PRN -  held prn Nitro as patient took Viagra on 7/19  HFpEF Chest x-ray negative for cardiomegaly, fluid. 2+ left LE edema on exam. Lungs CTAB. Echo on 07/13/2018 demonstrates EF of 65-70%. Grade 1 diastolic dysfunction, bicuspid  aortic valve - Lasix held on most recent discharge  - Consider repeat Echo  - Strict I&O, daily weights as above   CKD, stage II Cr 1.74 today. Baseline creatinine appears to be 1.3 and on admission was 2.68. GFR 41, BUN 51.  - Lasix discontinued at recent discharge.  - Holding Lisinopril due to hypotension - plan per above  Hx of EtOH abuse:  Reports drinking excess amounts of vodka. States last drink was before prior admission. Evidence of hepatic steatosis on CT. AST and ALT WNL. PT 13.5, INR 1. - CIWA daily  - Thiamine 220 mg daily and folic acid 1 mg daily on d/c  - Replete electrolytes PRN - CBC, CMP daily - Consider recommendation to AA on discharge  FEN/GI: heart healthy/ carb modified diet  PPx: Lovenox   Disposition: Admitted to med telemetry, attending Dr. Owens Shark  Subjective:  Patient seen and examined at bedside. He states that since his discharge last Saturday, he has had poor PO intake. He states that he is having RUQ pain that he rates 7/10, is on a current bowel regimen. Denies chest pain, dyspnea, dizziness and fatigue.   Objective: Temp:  [98.2 F (36.8 C)-98.3 F (36.8 C)] 98.3 F (36.8 C) (07/20 0848) Pulse Rate:  [60-79] 78 (07/20 1300) Resp:  [14-30] 23 (07/20 1300) BP: (92-126)/(52-96) 126/82 (07/20 1300) SpO2:  [96 %-100 %] 100 % (07/20 1300) Weight:  [77.1 kg] 77.1 kg (07/20 0851) Physical Exam: General: Patient in no acute distress, resting comfortably in bed. Cardiovascular: regular rate and rhythm, no gallops or murmurs appreciated Respiratory: lungs clear to auscultation bilaterally, no rales or rhonchi noted  Abdomen: tenderness on palpation of the RUQ, active bowel sounds  Extremities: 2+ pitting edema in left LE, distal pulse intact on the left   Laboratory: Recent Labs  Lab 01/13/20 0357 01/17/20 1614 01/18/20 0611  WBC 3.2* 4.2 3.1*  HGB 11.7* 11.4* 10.3*  HCT 35.3* 35.9* 32.7*  PLT 240 196 167   Recent Labs  Lab  01/13/20 0357 01/17/20 1614 01/18/20 0611  NA 136 132* 138  K 4.3 4.5 4.2  CL 101 97* 108  CO2 25 23 23   BUN 26* 64* 51*  CREATININE 1.34* 2.68* 1.74*  CALCIUM 8.9 9.2 8.7*  PROT  --  7.5 6.8  BILITOT  --  0.8 0.8  ALKPHOS  --  102 84  ALT  --  27 26  AST  --  23 24  GLUCOSE 146* 314* 138*      Imaging/Diagnostic Tests: DG Chest Port 1 View  Result Date: 01/17/2020 CLINICAL DATA:  Weakness and hypotension EXAM: PORTABLE CHEST 1 VIEW COMPARISON:  06/20/2018 FINDINGS: Cardiac shadow is within normal limits. Aortic calcifications are noted. The lungs are clear. No acute bony abnormality is noted. IMPRESSION: No active disease. Electronically Signed   By: Inez Catalina M.D.   On: 01/17/2020 17:05   CT Renal Stone Study  Result Date: 01/17/2020 CLINICAL DATA:  Right flank pain, stone disease suspected EXAM: CT ABDOMEN AND PELVIS WITHOUT CONTRAST TECHNIQUE: Multidetector CT imaging of the abdomen and pelvis was performed following the standard protocol without IV contrast. COMPARISON:  CT 01/27/2019 FINDINGS: Lower chest: Lung bases are clear. Normal heart size. No pericardial effusion. Calcifications on the  aortic leaflets. Coronary artery calcifications as well. Mild bilateral gynecomastia. Hepatobiliary: Diffuse hepatic hypoattenuation compatible with hepatic steatosis. Sparing along the gallbladder fossa. No visible focal liver lesion. Smooth surface contour. Gallbladder and biliary tree are unremarkable. No visible calcified gallstones. Pancreas: Unremarkable. No pancreatic ductal dilatation or surrounding inflammatory changes. Spleen: Normal in size. No concerning splenic lesions. Adrenals/Urinary Tract: Normal adrenal glands. Kidneys are symmetric in size and normally located. No visible or concerning renal lesions. No visible urolithiasis or hydronephrosis. Physiologic distension of the urinary bladder without acute or worrisome abnormality. Stomach/Bowel: Distal esophagus, stomach and  duodenal sweep are unremarkable. No small bowel wall thickening or dilatation. A normal appendix is visualized. No colonic dilatation or wall thickening. Moderate colonic stool burden. No evidence of obstruction. Vascular/Lymphatic: Atherosclerotic calcifications within the abdominal aorta and branch vessels. No aneurysm or ectasia. Few prominent bilateral inguinal nodes with normal morphology, stable from priors as remote is 2010. No enlarged or enlarging adenopathy. Reproductive: The prostate and seminal vesicles are unremarkable. Other: No abdominopelvic free fluid or free gas. No bowel containing hernias. Small fat containing umbilical hernia. Mild soft tissue edema in the flanks and posterior soft tissues. Musculoskeletal: Multilevel degenerative changes in the spine with some sclerotic endplate features likely reflecting Modic type endplate changes unchanged from comparison exam. No acute or suspicious osseous lesions. Mild degenerative changes in the hips and pelvis as well. IMPRESSION: 1. Moderate colonic stool burden, could correlate for constipation. 2. No other acute intra-abdominal process to provide cause for patient's right flank pain, specifically no visible urolithiasis or hydronephrosis. 3. Hepatic steatosis. 4. Aortic Atherosclerosis (ICD10-I70.0). 5. Coronary artery calcifications are present. Please note that the presence of coronary artery calcium documents the presence of coronary artery disease, the severity of this disease and any potential stenosis cannot be assessed on this non-gated CT examination. Assessment for potential risk factor modification, dietary therapy or pharmacologic therapy may be warranted. Electronically Signed   By: Lovena Le M.D.   On: 01/17/2020 20:08   US Abdomen Limited RUQ  Result Date: 01/17/2020 CLINICAL DATA:  Right upper quadrant abdominal pain. EXAM: ULTRASOUND ABDOMEN LIMITED RIGHT UPPER QUADRANT COMPARISON:  CT dated January 27, 2019 FINDINGS: Gallbladder:  No gallstones or wall thickening visualized. No sonographic Murphy sign noted by sonographer. Common bile duct: Diameter: 6 mm Liver: Diffuse increased echogenicity with slightly heterogeneous liver. Appearance typically secondary to fatty infiltration. Fibrosis secondary consideration. No secondary findings of cirrhosis noted. No focal hepatic lesion or intrahepatic biliary duct dilatation. Portal vein is patent on color Doppler imaging with normal direction of blood flow towards the liver. Other: None. IMPRESSION: 1. No evidence for cholelithiasis or acute cholecystitis. 2. Hepatic steatosis. Electronically Signed   By: Constance Holster M.D.   On: 01/17/2020 19:21    Donney Dice, DO 01/18/2020, 1:57 PM PGY-1, [facility] Intern pager: [phone], text pages welcome

## 2020-01-18 NOTE — ED Notes (Signed)
OT at pt bedside.

## 2020-01-18 NOTE — ED Notes (Signed)
Lunch Tray Ordered @ 1045 

## 2020-01-18 NOTE — Hospital Course (Signed)
Dc summary follow up recommendations:  Do not restart jardiance at discharge

## 2020-01-18 NOTE — Telephone Encounter (Signed)
I will see patient tomorrow; he was seen by Dr. Owens Shark this morning as the admitting attending  [name], MD

## 2020-01-18 NOTE — Telephone Encounter (Signed)
I am not in the hospital this afternoon, but will be seeing patient tomorrow as the rounding attending. He was seen by Dr. Owens Shark this morning as the admitting attending.  I attempted to call the number provided twice but there was no answer x2. If patient has concerns, he can have his nurse contact the inpatient team who are in the hospital caring for him.  [name], MD

## 2020-01-19 ENCOUNTER — Other Ambulatory Visit: Payer: Self-pay | Admitting: Family Medicine

## 2020-01-19 ENCOUNTER — Inpatient Hospital Stay: Payer: No Typology Code available for payment source | Admitting: Family Medicine

## 2020-01-19 LAB — CBC
HCT: 30.4 % — ABNORMAL LOW (ref 39.0–52.0)
Hemoglobin: 9.9 g/dL — ABNORMAL LOW (ref 13.0–17.0)
MCH: 28.9 pg (ref 26.0–34.0)
MCHC: 32.6 g/dL (ref 30.0–36.0)
MCV: 88.9 fL (ref 80.0–100.0)
Platelets: 167 10*3/uL (ref 150–400)
RBC: 3.42 MIL/uL — ABNORMAL LOW (ref 4.22–5.81)
RDW: 13.2 % (ref 11.5–15.5)
WBC: 3.2 10*3/uL — ABNORMAL LOW (ref 4.0–10.5)
nRBC: 0 % (ref 0.0–0.2)

## 2020-01-19 LAB — HEMOGLOBIN A1C
Hgb A1c MFr Bld: 8.7 % — ABNORMAL HIGH (ref 4.8–5.6)
Mean Plasma Glucose: 202.99 mg/dL

## 2020-01-19 LAB — BASIC METABOLIC PANEL
Anion gap: 9 (ref 5–15)
BUN: 35 mg/dL — ABNORMAL HIGH (ref 8–23)
CO2: 23 mmol/L (ref 22–32)
Calcium: 9.1 mg/dL (ref 8.9–10.3)
Chloride: 107 mmol/L (ref 98–111)
Creatinine, Ser: 1.26 mg/dL — ABNORMAL HIGH (ref 0.61–1.24)
GFR calc Af Amer: >60 mL/min (ref >60)
GFR calc non Af Amer: 60 mL/min — ABNORMAL LOW (ref >60)
Glucose, Bld: 146 mg/dL — ABNORMAL HIGH (ref 70–99)
Potassium: 4.4 mmol/L (ref 3.5–5.1)
Sodium: 139 mmol/L (ref 135–145)

## 2020-01-19 LAB — GLUCOSE, CAPILLARY
Glucose-Capillary: 171 mg/dL — ABNORMAL HIGH (ref 70–99)
Glucose-Capillary: 154 mg/dL — ABNORMAL HIGH (ref 70–99)

## 2020-01-19 MED ORDER — INSULIN GLARGINE 100 UNIT/ML ~~LOC~~ SOLN: 15.0000 [IU] | Freq: Every day | SUBCUTANEOUS | 11 refills | Status: DC

## 2020-01-19 MED ORDER — ROSUVASTATIN CALCIUM 10 MG PO TABS: 10.0000 mg | ORAL_TABLET | Freq: Every day | ORAL | 0 refills | Status: DC

## 2020-01-19 MED ORDER — METOPROLOL TARTRATE 25 MG/10 ML ORAL SUSPENSION: 6.2500 mg | Freq: Two times a day (BID) | ORAL | 0 refills | Status: DC

## 2020-01-19 NOTE — Progress Notes (Signed)
PT discharged in stable condition. IV removed. AVS documenting reviewed. Pt left in car with family. No belongings at bedside

## 2020-01-19 NOTE — Progress Notes (Signed)
Physical Therapy Treatment Patient Details Name: Adam Ray MRN: [id number] DOB: 27-Apr-1956 Today's Date: 01/19/2020    History of Present Illness 64 yo male presenting with RUQ abdominal pain and AKI. 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    PT Comments    Pt agreeable to OOB mobility this day. Residual limb protector not present in room, per pt it is at home, and PT requested pt's wife bring it to hospital for safety. Pt ambulated room distance with close guard for safety, increased time and effort and required cuing for taking his time. Pt tolerated BKA exercises well, requires multiple repeated cuing for form. PT to continue to follow acutely.      Follow Up Recommendations  Home health PT;Supervision for mobility/OOB     Equipment Recommendations  None recommended by PT    Recommendations for Other Services       Precautions / Restrictions Precautions Precautions: Fall Other Brace: limb guard - not present in room, pt states it is at home Restrictions Weight Bearing Restrictions: No RLE Weight Bearing: Non weight bearing Other Position/Activity Restrictions: recent RBKA    Mobility  Bed Mobility Overal bed mobility: Needs Assistance Bed Mobility: Supine to Sit     Supine to sit: Min assist     General bed mobility comments: Min assist for scooting hips forward towards EOB once sitting up, increased time and effort.  Transfers Overall transfer level: Needs assistance Equipment used: Rolling walker (2 wheeled) Transfers: Sit to/from Stand Sit to Stand: Min assist;From elevated surface         General transfer comment: Min assist for power up, steadying upon standing and when transitioning his hand from pushing up on EOB to RW.  Ambulation/Gait Ambulation/Gait assistance: Min guard Gait Distance (Feet): 30 Feet Assistive device: Rolling walker (2 wheeled) Gait Pattern/deviations: Step-to pattern;Decreased step length -  left;Antalgic;Trunk flexed Gait velocity: decr   General Gait Details: Hop to gait, min guard for safety only. Verbal cuing for pt to take his time, as pt tends to rush when fatigued especially   Stairs             Wheelchair Mobility    Modified Rankin (Stroke Patients Only)       Balance Overall balance assessment: Needs assistance Sitting-balance support: Feet supported Sitting balance-Leahy Scale: Fair     Standing balance support: Bilateral upper extremity supported;During functional activity Standing balance-Leahy Scale: Poor Standing balance comment: reliant on RW for support                            Cognition Arousal/Alertness: Awake/alert Behavior During Therapy: WFL for tasks assessed/performed Overall Cognitive Status: Within Functional Limits for tasks assessed                               Problem Solving: Slow processing General Comments: pleasant, cooperative, increased processing time to understand and follow verbal commands      Exercises Amputee Exercises Quad Sets: Right;10 reps;Supine Hip Extension: AROM;Right;10 reps;Supine Hip ABduction/ADduction: Right;10 reps;Supine;AROM Straight Leg Raises: Right;10 reps;Supine;AROM    General Comments        Pertinent Vitals/Pain Pain Assessment: No/denies pain Pain Intervention(s): Monitored during session    Home Living                      Prior Function  PT Goals (current goals can now be found in the care plan section) Acute Rehab PT Goals Patient Stated Goal: wife wants rehab for patient due to concerns over medication management. patient wants to return home PT Goal Formulation: With patient/family Time For Goal Achievement: 02/01/20 Potential to Achieve Goals: Good Progress towards PT goals: Progressing toward goals    Frequency    Min 3X/week      PT Plan Current plan remains appropriate    Co-evaluation               AM-PAC PT "6 Clicks" Mobility   Outcome Measure  Help needed turning from your back to your side while in a flat bed without using bedrails?: A Little Help needed moving from lying on your back to sitting on the side of a flat bed without using bedrails?: A Little Help needed moving to and from a bed to a chair (including a wheelchair)?: A Little Help needed standing up from a chair using your arms (e.g., wheelchair or bedside chair)?: A Little Help needed to walk in hospital room?: A Little Help needed climbing 3-5 steps with a railing? : A Lot 6 Click Score: 17    End of Session Equipment Utilized During Treatment: Gait belt Activity Tolerance: Patient tolerated treatment well Patient left: with call bell/phone within reach;in chair;with chair alarm set Nurse Communication: Mobility status PT Visit Diagnosis: Unsteadiness on feet (R26.81);Other abnormalities of gait and mobility (R26.89);Difficulty in walking, not elsewhere classified (R26.2)     Time: 6415-8309 PT Time Calculation (min) (ACUTE ONLY): 20 min  Charges:  $Gait Training: 8-22 mins                     Melquan Ernsberger E, PT [place] Pager [phone]  Office [phone]   Roxine Caddy D Elonda Husky 01/19/2020, 12:32 PM

## 2020-01-19 NOTE — Plan of Care (Signed)
°  Problem: Education: Goal: Knowledge of General Education information will improve Description: Including pain rating scale, medication(s)/side effects and non-pharmacologic comfort measures Outcome: Adequate for Discharge   Problem: Health Behavior/Discharge Planning: Goal: Ability to manage health-related needs will improve Outcome: Adequate for Discharge   Problem: Clinical Measurements: Goal: Ability to maintain clinical measurements within normal limits will improve Outcome: Adequate for Discharge Goal: Will remain free from infection Outcome: Adequate for Discharge Goal: Diagnostic test results will improve Outcome: Adequate for Discharge Goal: Respiratory complications will improve Outcome: Adequate for Discharge Goal: Cardiovascular complication will be avoided Outcome: Adequate for Discharge   Problem: Coping: Goal: Level of anxiety will decrease Outcome: Adequate for Discharge   Problem: Elimination: Goal: Will not experience complications related to bowel motility Outcome: Adequate for Discharge Goal: Will not experience complications related to urinary retention Outcome: Adequate for Discharge   Problem: Pain Managment: Goal: General experience of comfort will improve Outcome: Adequate for Discharge   Problem: Safety: Goal: Ability to remain free from injury will improve Outcome: Adequate for Discharge   Problem: Skin Integrity: Goal: Risk for impaired skin integrity will decrease Outcome: Adequate for Discharge   Problem: Education: Goal: Knowledge of General Education information will improve Description: Including pain rating scale, medication(s)/side effects and non-pharmacologic comfort measures Outcome: Adequate for Discharge   Problem: Health Behavior/Discharge Planning: Goal: Ability to manage health-related needs will improve Outcome: Adequate for Discharge   Problem: Clinical Measurements: Goal: Ability to maintain clinical measurements  within normal limits will improve Outcome: Adequate for Discharge Goal: Will remain free from infection Outcome: Adequate for Discharge Goal: Diagnostic test results will improve Outcome: Adequate for Discharge Goal: Respiratory complications will improve Outcome: Adequate for Discharge Goal: Cardiovascular complication will be avoided Outcome: Adequate for Discharge   Problem: Coping: Goal: Level of anxiety will decrease Outcome: Adequate for Discharge   Problem: Elimination: Goal: Will not experience complications related to bowel motility Outcome: Adequate for Discharge Goal: Will not experience complications related to urinary retention Outcome: Adequate for Discharge   Problem: Pain Managment: Goal: General experience of comfort will improve Outcome: Adequate for Discharge   Problem: Safety: Goal: Ability to remain free from injury will improve Outcome: Adequate for Discharge   Problem: Skin Integrity: Goal: Risk for impaired skin integrity will decrease Outcome: Adequate for Discharge

## 2020-01-19 NOTE — TOC Initial Note (Signed)
Transition of Care [facility]) - Initial/Assessment Note    Patient Details  Name: Adam Ray MRN: [id number] Date of Birth: 1956/01/20  [facility] CM/SW Contact:    Benard [place], LCSW Phone Number: 01/19/2020, 9:43 AM  Clinical Narrative:                 Patient with recent hospital discharge on 7/17. He was set up with a wheel chair, walker, 3 in 1 and two ramps through the [place]. He is active with [facility] (RN/PT/OT) and will need home health resumption orders. [facility] transportation application also completed last admission.    Expected Discharge Plan: [place] Barriers to Discharge: Continued Medical Work up   Patient Goals and CMS Choice Patient states their goals for this hospitalization and ongoing recovery are:: Return home CMS Medicare.gov Compare Post Acute Care list provided to:: Patient Choice offered to / list presented to : Patient  Expected Discharge Plan and Services Expected Discharge Plan: [place]   Discharge Planning Services: CM Consult Post Acute Care Choice: [place] arrangements for the past 2 months: Single Family Home                           HH Arranged: RN, PT, OT [facility] Agency: [place] Date [place]: 01/19/20 Time [place]: [phone] Representative spoke with at [place]: Ronalee Belts  Prior Living Arrangements/Services Living arrangements for the past 2 months: [place] with:: Spouse Patient language and need for interpreter reviewed:: Yes Do you feel safe going back to the place where you live?: Yes      Need for Family Participation in Patient Care: Yes (Comment) Care giver support system in place?: Yes (comment) Current home services: DME Criminal Activity/Legal Involvement Pertinent to Current Situation/Hospitalization: No - Comment as needed  Activities of Daily Living Home Assistive Devices/Equipment: Eyeglasses, Gilford Rile  (specify type) ADL Screening (condition at time of admission) Patient's cognitive ability adequate to safely complete daily activities?: Yes Is the patient deaf or have difficulty hearing?: No Does the patient have difficulty seeing, even when wearing glasses/contacts?: No Does the patient have difficulty concentrating, remembering, or making decisions?: No Patient able to express need for assistance with ADLs?: Yes Does the patient have difficulty dressing or bathing?: No Independently performs ADLs?: No Communication: Independent Dressing (OT): Independent Grooming: Independent Feeding: Independent Bathing: Independent Toileting: Independent In/Out Bed: Needs assistance Is this a change from baseline?: Pre-admission baseline Does the patient have difficulty walking or climbing stairs?: Yes Weakness of Legs: Right Weakness of Arms/Hands: None  Permission Sought/Granted Permission sought to share information with : Chartered certified accountant granted to share information with : Yes, Verbal Permission Granted  Share Information with NAME: [name]  Permission granted to share info w AGENCY: [employer] and [facility]  Permission granted to share info w Relationship: Spouse     Emotional Assessment Appearance:: Appears stated age     Orientation: : Oriented to Self, Oriented to Place, Oriented to  Time, Oriented to Situation Alcohol / Substance Use: Not Applicable Psych Involvement: No (comment)  Admission diagnosis:  RUQ abdominal pain [R10.11] AKI (acute kidney injury) ([place]) [N17.9] Hypotension, unspecified hypotension type [I95.9] Patient Active Problem List   Diagnosis Date Noted  . Coronary artery disease 01/18/2020  . AKI (acute kidney injury) ([place]) 01/17/2020  . Cutaneous abscess of right foot   . Cellulitis  of right foot 01/01/2020  . Cellulitis 01/01/2020  . Nocturnal leg cramps 10/28/2019  . Hyperlipidemia 05/26/2019  . Edema leg 12/29/2018  . STEMI  involving left circumflex coronary artery ([place]) 07/12/2018  . Status post transmetatarsal amputation of foot, right ([place]) 07/08/2018  . Limited mobility 05/12/2018  . Gingival foreign body 07/22/2017  . Anxiety state 06/02/2017  . Leg swelling 01/29/2017  . Idiopathic chronic venous hypertension of both lower extremities with inflammation 09/24/2016  . Testicular mass 04/18/2016  . Subacute osteomyelitis, right ankle and foot ([place]) 01/29/2016  . Diabetic polyneuropathy associated with type 2 diabetes mellitus ([place])   . Type 2 diabetes mellitus with neurologic complication, with long-term current use of insulin ([place]) 12/08/2015  . Pulmonary nodule 02/01/2015  . Depression 12/09/2013  . Warts, genital 08/20/2013  . 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 Diabetic retinopathy ([place]) 01/28/2013  . Sleep apnea 09/06/2010  . 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 Essential hypertension 11/09/2008  . COPD, mild ([place]) 10/06/2006  . SICKLE-CELL TRAIT 04/26/2006  . ERECTILE DYSFUNCTION 04/26/2006  . Tobacco abuse 04/26/2006  . PAD (peripheral artery disease) ([place]) 04/26/2006  . ALLERGIC RHINITIS 04/26/2006   PCP:  [name], MD Pharmacy:   [id number] - [place], [place] [address] [place] [address] Phone: [phone] Fax: [phone]  [place], [place] - [address] [place] [address] Phone: [phone] Fax: [place], [place] - [place] [phone] [place] [address] Phone: [phone] Fax: [phone]     Social Determinants of Health ([place]) Interventions    Readmission Risk Interventions Readmission Risk Prevention Plan 01/19/2020  Transportation Screening Complete  PCP or Specialist Appt within 5-7  Days Complete  Home Care Screening Complete  Medication Review (RN CM) Referral to Pharmacy  Some recent data might be hidden

## 2020-01-19 NOTE — Progress Notes (Signed)
Family Medicine Teaching Service Daily Progress Note Intern Pager: [phone]  Patient name: CECILIO OHLRICH Medical record number: [id number] Date of birth: 1956/05/27 Age: 64 y.o. Gender: male  Primary Care Provider: [name], MD Consultants: None Code Status: Full  Pt Overview and Major Events to Date:  Admitted 01/17/2020  Assessment and Plan: Arvell Pulsifer Foustis a 64 y.o.malepresenting with RUQ abdominal pain and AKI. PMH is significantfor righttranstibialBKA (7/8),hypertension, CAD, PAD, HFpEF, insulin-dependent diabetes, CKD 2,osteoarthritis,andhistory of alcohol abuse.  AKIlikely 2/2 hypotension and decreased PO intake: Cr 1.26 today, improved from Cr 1.74 and Cr. 2.68 yesterday on admission. AKI likely secondary to hypotension and decreased PO intake. Patient states he has not had much oral intake since discharged home last Saturday.  - Strict I&O, monitor to keep urine output > 30 mL/hr - Daily weights - Avoid nephrotoxic agents, hold lisinopril, januvia -Continue to monitor BMP   Blurred vision Patient admits to blurred vision that he denied this morning and during our previous encounters. He states that it has been worsening since his right transtibial amputation. He denies any eye pain or headache. Reports 2 falls during his last admission but none during this admission. He denies any weakness and dizziness. On exam, deficits appear to be more prominent in the right eye with decreased vision peripherally in both eyes. Per chart review, this appears to be a chronic issue that could also be likely secondary to his diabetes. -Follow up outpatient with opthalmology with Dr. Coralyn Pear on 7/22 (tomorrow morning)  Hypotension in setting of chronic HTN Homemeds include Lisinopril 5 mg, metoprolol tartrate 12.5 mg BID. BP 94/59 on arrival.  BP today 122/64. Likely 2/2 decreased PO intake per above. Patient also took viagra on the morning of admission which can also cause  hypotension. LA 1.4, no evidence of infection with WBC 3.2 (today) and afebrile. Patient's surgical wound of RLE also without signs of infection. BP improved s/p 1.5L fluids, 117/61. Will continue to monitor closely. - holding lisinopril  -Continue metoprolol 6.25 mg  - would recommend discontinuing viagra on discharge given risk of hypotension  Abdominal Pain Endorses generalized abdominal soreness but no tenderness. Denies nausea, vomting or associated symptoms. CT abdomen with evidence of moderate colonic stool burden. Last BM 7/17 on the day of discharge, patient has not had a BM yet but admits to flatus. Patient on Percocet for pain control for recent transtibial amputation. This is likely related to constipation. No evidence of renal stones and no urinary symptoms apart from decreased urinary frequency.No evidence for cholelithiasis or acute cholecystitison RUQ U/S. Evidence of hepatic steatosis on imaging. Patient with history of alcohol abuse. AST/ALT, PT and INR WNL. Hepatomegaly not appreciated and no fluid wave on exam.No signs of infection, WBC 3.2 and afebrile. CXR also negative for RLL pneumonia.  - Bowel regimen to include senna, Miralaxscheduled, especially while taking narcotics - monitor vitals per floor protocol  - Encourage fluid intake, 1.5 L daily  - consider enema if no adequate response from bowel regimen  S/prighttranstibial amputation(01/06/20)- stable Recently discharged with home health 7/17 s/p right transtibial amputationfollowing cellulitis of RLE.Pain control at discharge: 5-325 Percocet. Follow up appointment with ortho scheduled for this week. No recent fevers, erythema, drainage to suggest infection. WBC 3.2 - PT/OT eval - Wound care consult - Fall precautions  - Scheduled Tylenol 1000 BID and oxycodone IR 5 mg q8h PRN severe pain  Type 2 Diabetes, insulin dependent Last HgA1C7.2 on 10/28/19.  CBG 184 (7/20). Blood glucose  146. - Hold  homeJardiance 12.5 mg, Lantus 50 mg (patient reports only taking 30U) - Continue Lantus15Udaily -sSSIwith AC&HS glucose checks. - Hypoglycemia protocols ordered.  -repeatHgbA1C  CAD, PAD Serial troponins 6 > 7. Negative CXR. Denies chest pain, SOB.EKG without acute changes. - continue ticagrelor, ASA PRN -heldprnNitro as patient took Viagra on 7/19  HFpEF Chest x-ray negative for cardiomegaly, fluid. 1+ left LE edema on exam. Lungs CTAB.Echo on 07/13/2018 demonstrates EF of 65-70%. Grade 1 diastolic dysfunction, bicuspid aortic valve - Lasix held on most recent discharge  - Consider repeat Echo - Strict I&O, daily weights as above  CKD, stage II Cr 1.26 today. Baseline creatinine appears to be 1.3 and on admission was 2.68. GFR 41, BUN 51. - Lasix discontinued at recent discharge.  - Holding Lisinopril due to hypotension - plan per above  Hx of EtOH abuse: Reports drinking excess amounts of vodka. States last drink was before prior admission. Evidence of hepatic steatosis on CT. AST and ALT WNL. PT 13.5, INR 1. - CIWAdaily - Thiamine 035 mg daily and folic acid 1 mg daily on d/c  - Replete electrolytes PRN - CBC, CMP daily -Considerrecommendationto AAon discharge  FEN/GI: heart healthy/carb modified diet PPx: Lovenox  Disposition: Inpatient   Subjective:  Overnight events include patient given insulin and melatonin. Patient still denies having a BM but admits to flatulence. He denies any abdominal pain, just admits to generalized soreness. He denies chest pain, dyspnea, nausea, vomiting and generalized or localized pain.   Patient complains of blurred vision this afternoon although denied it this morning. He reports that it has been occurring since his right transtibial amputation this month. Denies any falls this admission and any dizziness or weakness.   Objective: Temp:  [98.3 F (36.8 C)-99 F (37.2 C)] 98.3 F (36.8 C) (07/21 0857) Pulse  Rate:  [62-84] 62 (07/21 0857) Resp:  [18-23] 20 (07/21 0857) BP: (103-132)/(59-77) 122/64 (07/21 0857) SpO2:  [97 %-99 %] 98 % (07/21 0857) Physical Exam: General: Patient in no acute distress, sitting upright in bed eating breakfast. Cardiovascular: regular rate and rhythm, no murmurs or gallops noted  Respiratory: lungs clear to auscultation bilaterally, no crackles noted  Abdomen: nontender on palpation, active bowel sounds  Neuro: AOx3, decreased vision in right eye Extremities: 1+ pitting edema in the left LE, mild edema above the left ankle, distal pulse intact on the left  Laboratory: Recent Labs  Lab 01/17/20 1614 01/18/20 0611 01/19/20 0218  WBC 4.2 3.1* 3.2*  HGB 11.4* 10.3* 9.9*  HCT 35.9* 32.7* 30.4*  PLT 196 167 167   Recent Labs  Lab 01/17/20 1614 01/18/20 0611 01/19/20 0218  NA 132* 138 139  K 4.5 4.2 4.4  CL 97* 108 107  CO2 23 23 23   BUN 64* 51* 35*  CREATININE 2.68* 1.74* 1.26*  CALCIUM 9.2 8.7* 9.1  PROT 7.5 6.8  --   BILITOT 0.8 0.8  --   ALKPHOS 102 84  --   ALT 27 26  --   AST 23 24  --   GLUCOSE 314* 138* 146*      Imaging/Diagnostic Tests: No results found.  Donney Dice, DO 01/19/2020, 3:19 PM PGY-1, [place] Intern pager: [phone], text pages welcome

## 2020-01-19 NOTE — Discharge Instructions (Signed)
Dear Adam Ray,  Thank you for letting us participate in your care. We are glad you are better.  POST-HOSPITAL & CARE INSTRUCTIONS 1. Please stop taking jardiance, lisinopril, and lasix 2. You metoprolol will be a solution for now and you will take 6.25 mg daily. Your Crestor will be 10 mg daily.  3. You have an eye appt 7/22 @ 8:30 with Dr. Coralyn Pear [address], [place] [address] 4. Go to your follow up appointments (listed below)   DOCTOR'S APPOINTMENT   Future Appointments  Date Time Provider Painter  01/21/2020 11:00 AM ACCESS TO CARE POOL [facility]  02/01/2020  9:10 AM [name], MD [facility]  02/08/2020  2:00 PM Lorretta Harp, MD [facility] [place]    Bernarda Caffey, MD. Go on 01/20/2020.   Specialty: Ophthalmology Why: Appointment with Dr. Coralyn Pear on 01/20/2020 at 8:30 am Contact information: [place] [address] [phone]               Take care and be well!  [facility]  [place], [place] [address] [phone]

## 2020-01-19 NOTE — TOC Transition Note (Signed)
Transition of Care [facility]) - CM/SW Discharge Note   Patient Details  Name: Adam Ray MRN: [id number] Date of Birth: 1955/10/16  Transition of Care [facility]) CM/SW Contact:  Benard Halsted, LCSW Phone Number: 01/19/2020, 4:34 PM   Clinical Narrative:    CSW requested Home Health RN/PT/OT orders from MD and made [facility] aware of discharge. No other needs identified.      Barriers to Discharge: Barriers Resolved   Patient Goals and CMS Choice Patient states their goals for this hospitalization and ongoing recovery are:: Return home CMS Medicare.gov Compare Post Acute Care list provided to:: Patient Choice offered to / list presented to : Patient  Discharge Placement                       Discharge Plan and Services   Discharge Planning Services: CM Consult Post Acute Care Choice: Home Health                    HH Arranged: RN, PT, OT [facility] Agency: [place] Date [place]: 01/19/20 Time [place]: [phone] Representative spoke with at [place]: Candelaria Arenas ([place]) Interventions     Readmission Risk Interventions Readmission Risk Prevention Plan 01/19/2020  Transportation Screening Complete  PCP or Specialist Appt within 5-7 Days Complete  Home Care Screening Complete  Medication Review (RN CM) Referral to Pharmacy  Some recent data might be hidden

## 2020-01-20 ENCOUNTER — Other Ambulatory Visit: Payer: Self-pay

## 2020-01-20 ENCOUNTER — Ambulatory Visit (INDEPENDENT_AMBULATORY_CARE_PROVIDER_SITE_OTHER): Payer: No Typology Code available for payment source | Admitting: Ophthalmology

## 2020-01-20 DIAGNOSIS — I10 Essential (primary) hypertension: Secondary | ICD-10-CM

## 2020-01-20 DIAGNOSIS — H25812 Combined forms of age-related cataract, left eye: Secondary | ICD-10-CM

## 2020-01-20 DIAGNOSIS — H35033 Hypertensive retinopathy, bilateral: Secondary | ICD-10-CM

## 2020-01-20 DIAGNOSIS — H3581 Retinal edema: Secondary | ICD-10-CM | POA: Diagnosis not present

## 2020-01-20 DIAGNOSIS — Z961 Presence of intraocular lens: Secondary | ICD-10-CM

## 2020-01-20 DIAGNOSIS — E113553 Type 2 diabetes mellitus with stable proliferative diabetic retinopathy, bilateral: Secondary | ICD-10-CM

## 2020-01-20 NOTE — Discharge Summary (Addendum)
[facility] Discharge Summary  Patient name: Adam Ray Medical record number: [id number] Date of birth: May 06, 1956 Age: 64 y.o. 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 Cardiovascular: regular rate and rhythm, no murmurs or gallops noted  Respiratory: lungs clear to auscultation bilaterally, no crackles noted  Abdomen: nontender on palpation, active bowel sounds  Neuro: AOx3, decreased vision in right eye, peripheral vision limited bilaterally  Extremities: 1+ pitting edema in the left LE, mild edema above the left ankle, distal pulse intact on the left  Brief Hospital Course:  Mr. Aguinaldo is a 64 year old gentleman who presented with abdominal pain, recently discharged on 7/17 for left LE cellulitis s/p transtibial amputation on 7/8.  AKI on CKD Stage II:  Cr 2.68 on admit, in setting of hypotension as discussed below and poor oral intake since recent discharge. Additionally, recently restarted home medications Lisinopril and Jardiance, and took Viagra on day of admit.  Baseline Cr on 7/15 was 1.34.  On exam, no increased work of breathing noted and 2+ pitting edema in  left LE. CXR demonstrated no active disease.  U/a without hematuria or proteinuria, nonoliguric.  Fortunately with fluids, monitoring BP, and encouraging oral intake his creatinine significantly improved returned to baseline at 1.26 on day of discharge.  Hypotension: Patient's BP 94/59 on admit, asymptomatic. Hypotension in the setting of chronic hypertension along with decreased oral intake and viagra. Given 1.5L fluids and BP improved to 117/61. Lisinopril was held, patient given decreased home metoprolol to 6.25 mg. BP monitored and patient normotensive state maintained throughout this hospitalization.   Constipation  Patient presented with a 2 day history of RUQ pain with 10/10 constant, sharp and stabbing pain. Patient stated that his last BM was on the day of discharge from last admission (7/19). Endorsed nausea but denied vomiting. Likely in the setting of decreased PO intake and scheduled home narcotics for recent right transtibial amputation from previous hospitalization. Abdominal US demonstrated hepatic steatosis with no evidence for cholelithiasis or acute cholecystitis. CT renal stone study demonstrated moderate colonic stool burden, could correlate for constipation with no other acute intra-abdominal process. AST/ALT, PT and INR all within normal limits. Patient given bowel regimen of senna and Miralax. Pain eventually resolved to mild soreness and patient instructed to continue bowel regimen at home.   Blurred vision  Patient admits to blurred vision on the afternoon of discharge that he denied during previous encounters. Per chart review, this appears to be a chronic issue. History of chronic vision issues likely related to diabetes and has reportedly worsened since his right transtibial amputation 01/06/2020. Denied any eye pain, associated discharge and headache. On exam, deficits appear to be more prominent in the right eye with decreased vision peripherally in both eyes.  Patient assisted  with arranging opthalmology follow up for the morning immediately following discharge.    Issues for Follow Up:  1. Follow up with ophthalmology,Dr. Coralyn Pear, for recent vision changes. Appointment scheduled for 01/20/2020 at 8:30 am. Continue regular follow up outpatient for annual diabetic eye exams.  2.  Follow up with orthopedics, Dr. Sharol Given, regarding right transtibial amputation. 3. Continue bowel regimen regularly given constipation, likely secondary to pain medication use for recent transtibial amputation. 4. Continue to follow up with PCP for hypertension and diabetes management to reassess as needed. Jardiance discontinued. Encourage continued compliance.  5. Remain cautious of sildenafil use. Do not take sildenafil and nitroglycerin simultaneously, could cause severe hypotension.    Significant Procedures:  None during this hospitalization   Significant Labs and Imaging:  Recent Labs  Lab 01/17/20 1614 01/18/20 0611 01/19/20 0218  WBC 4.2 3.1* 3.2*  HGB 11.4* 10.3* 9.9*  HCT 35.9* 32.7* 30.4*  PLT 196 167 167   Recent Labs  Lab 01/17/20 1614 01/17/20 1614 01/18/20 0611 01/19/20 0218  NA 132*  --  138 139  K 4.5   < > 4.2 4.4  CL 97*  --  108 107  CO2 23  --  23 23  GLUCOSE 314*  --  138* 146*  BUN 64*  --  51* 35*  CREATININE 2.68*  --  1.74* 1.26*  CALCIUM 9.2  --  8.7* 9.1  ALKPHOS 102  --  84  --   AST 23  --  24  --   ALT 27  --  26  --   ALBUMIN 3.4*  --  3.0*  --    < > = values in this interval not displayed.      Results/Tests Pending at Time of Discharge:  OCT, Retina - OU - Both Eyes  Result Date: 01/20/2020 Right Eye Quality was good. Central Foveal Thickness: 164. Progression has no prior data. Findings include normal foveal contour, no IRF, no SRF, central retinal atrophy, outer retinal atrophy, inner retinal atrophy, epiretinal membrane. Left Eye Quality was good. Central Foveal Thickness: 207. Progression has no prior data. Findings include  normal foveal contour, intraretinal fluid, no SRF, outer retinal atrophy, intraretinal hyper-reflective material, epiretinal membrane, subretinal hyper-reflective material. Notes *Images captured and stored on drive Diagnosis / Impression: PDR OU Diffuse atrophy OU ERM OU OS: Trace, non-central cystic changes Clinical management: See below Abbreviations: NFP - Normal foveal profile. CME - cystoid macular edema. PED - pigment epithelial detachment. IRF - intraretinal fluid. SRF - subretinal fluid. EZ - ellipsoid zone. ERM - epiretinal membrane. ORA - outer retinal atrophy. ORT - outer retinal tubulation. SRHM - subretinal hyper-reflective material. IRHM - intraretinal hyper-reflective material   Fluorescein Angiography Optos (Transit OS)  Result Date: 01/20/2020 Right Eye Progression has no prior data. Early phase findings include staining, window defect, microaneurysm, vascular perfusion defect (No NV). Mid/Late phase findings include staining, window defect, microaneurysm, vascular perfusion defect (No NV). Left Eye Progression has no prior data. Early phase findings include delayed filling, staining, window defect, microaneurysm, vascular perfusion defect (No NV). Mid/Late phase findings include staining, window defect, microaneurysm, vascular perfusion defect (No NV). 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  Medication List    STOP taking these medications   empagliflozin 25 MG Tabs tablet Commonly known as: JARDIANCE   lisinopril 5 MG tablet Commonly known as: ZESTRIL   metoprolol tartrate 25 MG tablet Commonly known as: LOPRESSOR Replaced by: metoprolol tartrate 25 mg/10 mL Susp     TAKE these medications   acetaminophen 500  MG tablet Commonly known as: TYLENOL Take 1,000 mg by mouth in the morning and at bedtime.   albuterol 108 (90 Base) MCG/ACT inhaler Commonly known as: VENTOLIN HFA Inhale 2 puffs into the lungs every 4 (four) hours as needed for wheezing or shortness of breath.   aspirin 81 MG chewable tablet Chew 81 mg by mouth daily.   baclofen 10 MG tablet Commonly known as: LIORESAL Take 10 mg by mouth 2 (two) times daily.   CALCIUM PO Take 1 tablet by mouth daily.   famotidine 20 MG tablet Commonly known as: Pepcid Take 1 tablet (20 mg total) by mouth 2 (two) times daily.   insulin glargine 100 UNIT/ML injection Commonly known as: LANTUS Inject 0.15 mLs (15 Units total) into the skin daily. What changed: how much to take   metoprolol tartrate 25 mg/10 mL Susp Commonly known as: LOPRESSOR Take 2.5 mLs (6.25 mg total) by mouth 2 (two) times daily. Replaces: metoprolol tartrate 25 MG tablet   multivitamin with minerals Tabs tablet Take 1 tablet by mouth daily.   nitroGLYCERIN 0.4 MG SL tablet Commonly known as: NITROSTAT Place 1 tablet (0.4 mg total) under the tongue every 5 (five) minutes x 3 doses as needed for chest pain.   oxyCODONE-acetaminophen 5-325 MG tablet Commonly known as: Percocet Take 1 tablet by mouth every 8 (eight) hours as needed for severe pain.   polyethylene glycol 17 g packet Commonly known as: MIRALAX / GLYCOLAX Take 17 g by mouth daily. What changed:   when to take this  reasons to take this   rosuvastatin 10 MG tablet Commonly known as: CRESTOR Take 1 tablet (10 mg total) by mouth daily at 6 PM. What changed:   medication strength  how much to take  when to take this   sildenafil 100 MG tablet Commonly known as: VIAGRA Take 100 mg by mouth daily as needed for erectile dysfunction.   ticagrelor 90 MG Tabs tablet Commonly known as: BRILINTA Take 1 tablet (90 mg total) by mouth 2 (two) times daily.   Vitamin D3 50 MCG (2000 UT) Tabs Take  2,000 Units by mouth daily.       Discharge Instructions: Please refer to Patient Instructions section of EMR for full details.  Patient was counseled important signs and symptoms that should prompt return to medical care, changes in medications, dietary instructions, activity restrictions, and follow up appointments.   Follow-Up Appointments:  Follow-up Information    Bernarda Caffey, MD. Go on 01/20/2020.   Specialty: Ophthalmology Why: Appointment with Dr. Coralyn Pear on 01/20/2020 at 8:30 am Contact information: [address] [place] [address] [place]. Go on 01/21/2020.   Why: Hospital follow up appt @ 11AM Contact information: [place]              Donney Dice, DO 01/20/2020, 4:07 PM PGY-1, [facility] Upper-Level Resident Addendum  My edits for correction/addition/clarification are added. Please see also any attending notes.    Patriciaann Clan, DO  Family Medicine PGY-3

## 2020-01-20 NOTE — Progress Notes (Signed)
[facility] Note  01/20/2020     CHIEF COMPLAINT Patient presents for Retina Evaluation   HISTORY OF PRESENT ILLNESS: TRISTON SKARE is a 64 y.o. male who presents to the clinic today for:   HPI    Retina Evaluation    In left eye.  This started 1 week ago.  Duration of 1 week.  Context:  distance vision and near vision.  I, the attending physician,  performed the HPI with the patient and updated documentation appropriately.          Comments    BS yesterday: 138 Last HgA1c: 8.7 Patient states his left eye became very blurry about a week ago, and states it came on all of a sudden.  Patient denies eye pain or discomfort and denies any new or worsening floaters or fol OU.  Hx of Pseudophakia OD, cataract OS--per [facility] notes, will proceed with CE/IOL OS when DM better controlled Hx of IVA OU--patient states he lost vision in his right eye secondary to injections       Last edited by Bernarda Caffey, MD on 01/23/2020 12:26 AM. (History)    former pt of Dr. Zigmund [name], current pt of Dr. Helane Rima at [facility], referred here for urgent post hospitalization f/u for decreased vision OS, pt with hx of PDR OU, s/p extensive PRP and focal laser, pt was recently hospitalized to have his right foot amputated, he feels like his left eye has gotten more blurry recently.  Referring physician: [name], MD [place],  [place] [address]  HISTORICAL INFORMATION:   Selected notes from the MEDICAL RECORD NUMBER Referred by Dr. Truman Hayward:  Ocular Hx- PMH-    CURRENT MEDICATIONS: No current outpatient medications on file. (Ophthalmic Drugs)   No current facility-administered medications for this visit. (Ophthalmic Drugs)   Current Outpatient Medications (Other)  Medication Sig  . acetaminophen (TYLENOL) 500 MG tablet Take 1,000 mg by mouth in the morning and at bedtime.   [name] albuterol (VENTOLIN HFA) 108 (90 Base) MCG/ACT inhaler Inhale 2 puffs into the  lungs every 4 (four) hours as needed for wheezing or shortness of breath.  [name] aspirin 81 MG chewable tablet Chew 81 mg by mouth daily.  . baclofen (LIORESAL) 10 MG tablet Take 10 mg by mouth 2 (two) times daily.   [name] CALCIUM PO Take 1 tablet by mouth daily.  . Cholecalciferol (VITAMIN D3) 50 MCG (2000 UT) TABS Take 2,000 Units by mouth daily.  . famotidine (PEPCID) 20 MG tablet Take 1 tablet (20 mg total) by mouth 2 (two) times daily.  . insulin glargine (LANTUS) 100 UNIT/ML injection Inject 0.15 mLs (15 Units total) into the skin daily.  . metoprolol tartrate (LOPRESSOR) 25 mg/10 mL SUSP Take 2.5 mLs (6.25 mg total) by mouth 2 (two) times daily.  . Multiple Vitamin (MULTIVITAMIN WITH MINERALS) TABS tablet Take 1 tablet by mouth daily.  . nitroGLYCERIN (NITROSTAT) 0.4 MG SL tablet Place 1 tablet (0.4 mg total) under the tongue every 5 (five) minutes x 3 doses as needed for chest pain.  [name] oxyCODONE-acetaminophen (PERCOCET) 5-325 MG tablet Take 1 tablet by mouth every 8 (eight) hours as needed for severe pain.  . polyethylene glycol (MIRALAX / GLYCOLAX) 17 g packet Take 17 g by mouth daily. (Patient taking differently: Take 17 g by mouth daily as needed (constipation). )  . rosuvastatin (CRESTOR) 10 MG tablet Take 1 tablet (10 mg total) by mouth daily at 6  PM.  . sildenafil (VIAGRA) 100 MG tablet Take 100 mg by mouth daily as needed for erectile dysfunction.  . ticagrelor (BRILINTA) 90 MG TABS tablet Take 1 tablet (90 mg total) by mouth 2 (two) times daily.   No current facility-administered medications for this visit. (Other)      REVIEW OF SYSTEMS: ROS    Positive for: Eyes   Negative for: Constitutional, Gastrointestinal, Neurological, Skin, Genitourinary, Musculoskeletal, HENT, Endocrine, Cardiovascular, Respiratory, Psychiatric, Allergic/Imm, Heme/Lymph   Last edited by Doneen Poisson on 01/20/2020  8:51 AM. (History)       ALLERGIES Allergies  Allergen Reactions  . Codeine Other  (See Comments)    Heart attack.    PAST MEDICAL HISTORY Past Medical History:  Diagnosis Date  . Allergy   . Arthritis   . Bronchitis   . Cataract   . Chronic kidney disease   . Claudication [facility])    right foot ray resection  . Colon polyps    hyperplastic  . COPD (chronic obstructive pulmonary disease) ([place])   . Coronary artery disease   . Diabetes mellitus   . Genital warts   . Gout   . Hyperlipidemia   . Hypertension   . Osteomyelitis of third toe of right foot ([place])   . Pneumonia   . Status post amputation of toe of right foot ([place]) 09/24/2016   Past Surgical History:  Procedure Laterality Date  . AMPUTATION Right 01/31/2016   Procedure: Right 2nd Toe Amputation;  Surgeon: Newt Minion, MD;  Location: [place];  Service: Orthopedics;  Laterality: Right;  . AMPUTATION Right 07/08/2018   Procedure: RIGHT TRANSMETATARSAL AMPUTATION;  Surgeon: Newt Minion, MD;  Location: [place];  Service: Orthopedics;  Laterality: Right;  . AMPUTATION Right 01/05/2020   Procedure: RIGHT BELOW KNEE AMPUTATION;  Surgeon: Newt Minion, MD;  Location: [place];  Service: Orthopedics;  Laterality: Right;  . CATARACT EXTRACTION     right eye  . COLONOSCOPY    . CORONARY STENT INTERVENTION N/A 07/12/2018   Procedure: CORONARY STENT INTERVENTION;  Surgeon: Troy Sine, MD;  Location: [place] [facility];  Service: Cardiovascular;  Laterality: N/A;  . CORONARY/GRAFT ACUTE MI REVASCULARIZATION N/A 07/12/2018   Procedure: Coronary/Graft Acute MI Revascularization;  Surgeon: Troy Sine, MD;  Location: [place] CV LAB;  Service: Cardiovascular;  Laterality: N/A;  . I & D EXTREMITY  04/11/2012   Procedure: IRRIGATION AND DEBRIDEMENT EXTREMITY;  Surgeon: Wylene Simmer, MD;  Location: [place];  Service: Orthopedics;  Laterality: Right;  . LEFT HEART CATH AND CORONARY ANGIOGRAPHY N/A 07/12/2018   Procedure: LEFT HEART CATH AND CORONARY ANGIOGRAPHY;  Surgeon: Troy Sine, MD;  Location: [place] CV LAB;   Service: Cardiovascular;  Laterality: N/A;  . Surgery left great toe    . Tear ducts bilateral eyes    . TRANSMETATARSAL AMPUTATION Right 07/08/2018    FAMILY HISTORY Family History  Problem Relation Age of Onset  . Diabetes Mother   . Stroke Mother   . Heart failure Father   . Colon cancer Neg Hx   . Esophageal cancer Neg Hx   . Rectal cancer Neg Hx   . Stomach cancer Neg Hx     SOCIAL HISTORY Social History   Tobacco Use  . Smoking status: Current Every Day Smoker    Packs/day: 0.30    Years: 48.00    Pack years: 14.40    Types: Cigars, Cigarettes    Start date: 07/02/1963  .  Smokeless tobacco: Former Systems developer  . Tobacco comment: wants to use electric cigarettes.  not interested in pills, worried about chantix side effects  Vaping Use  . Vaping Use: Former  Substance Use Topics  . Alcohol use: Yes    Alcohol/week: 0.0 standard drinks    Comment: occassional use  . Drug use: No         OPHTHALMIC EXAM:  Base Eye Exam    Visual Acuity (Snellen - Linear)      Right Left   Dist [place] CF @ face 20/60 -2   Dist ph [place] NI 20/40 -1       Tonometry (Tonopen, 9:07 AM)      Right Left   Pressure 25 18       Pupils      Dark Light Shape React APD   Right 3 2 Round Minimal 0   Left 3 2 Round Brisk 0       Visual Fields      Left Right    Full    Restrictions  Total superior temporal, inferior temporal, superior nasal, inferior nasal deficiencies       Extraocular Movement      Right Left    Full Full       Neuro/Psych    Oriented x3: Yes   Mood/Affect: Normal       Dilation    Both eyes: 1.0% Mydriacyl, 2.5% Phenylephrine @ 9:07 AM        Slit Lamp and Fundus Exam    Slit Lamp Exam      Right Left   Lids/Lashes Dermatochalasis - upper lid, Meibomian gland dysfunction Dermatochalasis - upper lid, Meibomian gland dysfunction   Conjunctiva/Sclera mild Melanosis mild Melanosis   Cornea well healed temporal cataract wounds, 1+ Punctate epithelial  erosions 2+ Punctate epithelial erosions, arcus   Anterior Chamber Deep and quiet Deep and quiet   Iris Round and dilated Round and dilated   Lens PC IOL in good position, 1+ Posterior capsular opacification 2+ Nuclear sclerosis, 2-3+ Cortical cataract   Vitreous Vitreous syneresis Vitreous syneresis, Posterior vitreous detachment, vitreous condensations       Fundus Exam      Right Left   Disc mild Pallor, Sharp rim mild Pallor, Sharp rim   C/D Ratio 0.4 0.4   Macula Blunted foveal reflex, extensive laser scarring, scattered IRH/MA, no edema Flat, extensive laser scarring, scattered IRH/MA, no edema   Vessels Vascular attenuation, Tortuous Vascular attenuation, Tortuous, Copper wiring, no NV   Periphery Attached, extensive 360 PRP, scattered IRH Attached, 360 PRP, scattered IRH        Refraction    Manifest Refraction      Sphere Cylinder Axis Dist VA   Right NI +/-3.00      Left -0.25 +0.25 180 20/50-2          IMAGING AND PROCEDURES  Imaging and Procedures for 01/20/2020  OCT, Retina - OU - Both Eyes       Right Eye Quality was good. Central Foveal Thickness: 164. Progression has no prior data. Findings include normal foveal contour, no IRF, no SRF, central retinal atrophy, outer retinal atrophy, inner retinal atrophy, epiretinal membrane.   Left Eye Quality was good. Central Foveal Thickness: 207. Progression has no prior data. Findings include normal foveal contour, intraretinal fluid, no SRF, outer retinal atrophy, intraretinal hyper-reflective material, epiretinal membrane, subretinal hyper-reflective material.   Notes *Images captured and stored on drive  Diagnosis / Impression:  PDR  OU Diffuse atrophy OU ERM OU OS: Trace, non-central cystic changes  Clinical management:  See below  Abbreviations: NFP - Normal foveal profile. CME - cystoid macular edema. PED - pigment epithelial detachment. IRF - intraretinal fluid. SRF - subretinal fluid. EZ - ellipsoid  zone. ERM - epiretinal membrane. ORA - outer retinal atrophy. ORT - outer retinal tubulation. SRHM - subretinal hyper-reflective material. IRHM - intraretinal hyper-reflective material        Fluorescein Angiography Optos (Transit OS)       Right Eye   Progression has no prior data. Early phase findings include staining, window defect, microaneurysm, vascular perfusion defect (No NV). Mid/Late phase findings include staining, window defect, microaneurysm, vascular perfusion defect (No NV).   Left Eye   Progression has no prior data. Early phase findings include delayed filling, staining, window defect, microaneurysm, vascular perfusion defect (No NV). Mid/Late phase findings include staining, window defect, microaneurysm, vascular perfusion defect (No NV).   Notes **Images stored on drive**  Impression: Stable PDR OU with extensive PRP and focal laser OU Scattered vascular perfusion defects and retinal ischemia OU Scattered MA OU No NV OU                 ASSESSMENT/PLAN:    ICD-10-CM   1. Retinal edema  H35.81 OCT, Retina - OU - Both Eyes  2. Hypertensive retinopathy of both eyes  H35.033 Fluorescein Angiography Optos (Transit OS)    1,2. 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 Zigmund [name], currently following with Dr. Helane Rima at [facility] - referred here following hospital discharge due to decreased vision OS and inability to be transported to [facility] - review of chart shows BCVA stable OU from Dr. Helane Rima visit on 9.24.20 - pt has extensive laser in place -- both PRP and focal grid laser -- no NV - The incidence, risk factors for progression, natural history and treatment options for diabetic retinopathy were discussed with patient.   - The need for close monitoring of blood glucose, blood pressure, and serum lipids, avoiding cigarette or any type of tobacco, and the need for long term follow up was also discussed with patient. - FA  today (07.22.21) confirms no active NV, but extensive ischemia OU - OCT without diabetic macular edema, both eyes  - pt to re-establish with Dr. Helane Rima at [facility]  3,4. Hypertensive retinopathy OU - discussed importance of tight BP control - monitor  5. Mixed Cataract OS - The symptoms of cataract, surgical options, and treatments and risks were discussed with patient. - discussed diagnosis and progression - will refer to [facility] for cataract consult  6. Pseudophakia OD  - s/p CE/IOL OD  - IOL in good position, doing well - monitor    Ophthalmic Meds Ordered this visit:  No orders of the defined types were placed in this encounter.      Return if symptoms worsen or fail to improve.  There are no Patient Instructions on file for this visit.   Explained the diagnoses, plan, and follow up with the patient and they expressed understanding.  Patient expressed understanding of the importance of proper follow up care.   This document serves as a record of services personally performed by Gardiner Sleeper, MD, PhD. It was created on their behalf by San Jetty. Owens Shark, OA an ophthalmic technician. The creation of this record is the provider's dictation and/or activities during the visit.    Electronically signed by:  San Jetty. Marguerita Merles 07.22.2021 12:33 AM   Gardiner Sleeper, M.D., Ph.D. Diseases & Surgery of the Retina and Vitreous [facility]  I have reviewed the above documentation for accuracy and completeness, and I agree with the above. Gardiner Sleeper, M.D., Ph.D. 01/23/20 12:33 AM   Abbreviations: M myopia (nearsighted); A astigmatism; H hyperopia (farsighted); P presbyopia; Mrx spectacle prescription;  CTL contact lenses; OD right eye; OS left eye; OU both eyes  XT exotropia; ET esotropia; PEK punctate epithelial keratitis; PEE punctate epithelial erosions; DES dry eye syndrome; MGD meibomian gland dysfunction; ATs artificial tears; PFAT's  preservative free artificial tears; [place] nuclear sclerotic cataract; PSC posterior subcapsular cataract; ERM epi-retinal membrane; PVD posterior vitreous detachment; RD retinal detachment; DM diabetes mellitus; DR diabetic retinopathy; NPDR non-proliferative diabetic retinopathy; PDR proliferative diabetic retinopathy; CSME clinically significant macular edema; DME diabetic macular edema; dbh dot blot hemorrhages; CWS cotton wool spot; POAG primary open angle glaucoma; C/D cup-to-disc ratio; HVF humphrey visual field; GVF goldmann visual field; OCT optical coherence tomography; IOP intraocular pressure; BRVO Branch retinal vein occlusion; CRVO central retinal vein occlusion; CRAO central retinal artery occlusion; BRAO branch retinal artery occlusion; RT retinal tear; SB scleral buckle; PPV pars plana vitrectomy; VH Vitreous hemorrhage; PRP panretinal laser photocoagulation; IVK intravitreal kenalog; VMT vitreomacular traction; MH Macular hole;  NVD neovascularization of the disc; NVE neovascularization elsewhere; AREDS age related eye disease study; ARMD age related macular degeneration; POAG primary open angle glaucoma; EBMD epithelial/anterior basement membrane dystrophy; ACIOL anterior chamber intraocular lens; IOL intraocular lens; PCIOL posterior chamber intraocular lens; Phaco/IOL phacoemulsification with intraocular lens placement; [place] photorefractive keratectomy; LASIK laser assisted in situ keratomileusis; HTN hypertension; DM diabetes mellitus; COPD chronic obstructive pulmonary disease

## 2020-01-21 ENCOUNTER — Inpatient Hospital Stay: Payer: No Typology Code available for payment source

## 2020-01-22 LAB — CULTURE, BLOOD (ROUTINE X 2)
Culture: NO GROWTH
Culture: NO GROWTH

## 2020-01-23 ENCOUNTER — Encounter (INDEPENDENT_AMBULATORY_CARE_PROVIDER_SITE_OTHER): Payer: Self-pay | Admitting: Ophthalmology

## 2020-01-25 ENCOUNTER — Telehealth: Payer: Self-pay | Admitting: Orthopedic Surgery

## 2020-01-25 ENCOUNTER — Ambulatory Visit: Payer: No Typology Code available for payment source | Admitting: Family Medicine

## 2020-01-25 NOTE — Telephone Encounter (Signed)
Called patient got recording call cannot be completed at this time. Please try your call again later

## 2020-01-26 ENCOUNTER — Telehealth: Payer: Self-pay | Admitting: Orthopedic Surgery

## 2020-01-26 NOTE — Telephone Encounter (Signed)
Called patient got recording call can not be completed at this time   Hang up and try call again later   Will try again later

## 2020-01-27 ENCOUNTER — Other Ambulatory Visit: Payer: Self-pay

## 2020-01-27 ENCOUNTER — Ambulatory Visit (INDEPENDENT_AMBULATORY_CARE_PROVIDER_SITE_OTHER): Payer: No Typology Code available for payment source | Admitting: Family Medicine

## 2020-01-27 ENCOUNTER — Encounter: Payer: Self-pay | Admitting: Family Medicine

## 2020-01-27 VITALS — BP 112/58 | HR 53 | Wt 172.8 lb

## 2020-01-27 DIAGNOSIS — L03115 Cellulitis of right lower limb: Secondary | ICD-10-CM | POA: Diagnosis not present

## 2020-01-27 DIAGNOSIS — E1141 Type 2 diabetes mellitus with diabetic mononeuropathy: Secondary | ICD-10-CM | POA: Diagnosis not present

## 2020-01-27 DIAGNOSIS — M7989 Other specified soft tissue disorders: Secondary | ICD-10-CM

## 2020-01-27 DIAGNOSIS — I10 Essential (primary) hypertension: Secondary | ICD-10-CM

## 2020-01-27 DIAGNOSIS — Z794 Long term (current) use of insulin: Secondary | ICD-10-CM

## 2020-01-27 NOTE — Assessment & Plan Note (Addendum)
This is certainly a chronic problem although it is difficult to tell if it is changed dramatically recently.  Previous notes document well that he is previously been on Lasix for lower extremity swelling.  Since he has been off his Lasix since his recent hospitalization this is likely accumulation.  It does not seem to be a symptom of heart failure.  Lungs are clear and there is no elevated JVD.  I cannot rule out DVT based on my clinical exam.  We will follow up with lower extremity Doppler of the left side. -Follow-up Doppler -Follow-up CBC

## 2020-01-27 NOTE — Assessment & Plan Note (Signed)
His current diabetes regimen seems unusual.  [name] reports that he came up with this particular regimen on his own (with regard to determining how much insulin he receives of his Lantus or NovoLog today).  We did not have time to fully discuss a better diabetes regimen for him so he was encouraged to follow-up with his primary care provider.

## 2020-01-27 NOTE — Patient Instructions (Addendum)
Leg swelling: This might be your chronic leg swelling issue. I am sorry I am not sure. Because you have risk factors for blood clots, I do think we should get an ultrasound to make sure there is no blood clot. We will get that appointment scheduled for you before you leave the clinic today.  We will also get blood work to make sure your kidneys are coming back to normal.  Diabetes: Make sure to follow-up with Dr. [name] in the next month or two to address her diabetes regimen.

## 2020-01-27 NOTE — Assessment & Plan Note (Signed)
-  Continue metoprolol -Follow-up BMP

## 2020-01-27 NOTE — Progress Notes (Signed)
SUBJECTIVE:   CHIEF COMPLAINT / HPI:   Blurred vision Is a chronic issue.  Does not appear to have worsened recently.  He reports that he follows with an ophthalmologist at [facility].  He has an upcoming appointment.  Hypertension Currently taking metoprolol.  No complaints of lightheadedness or headaches.  Leg swelling He reports that he has some left sided leg swelling that seems to have worsened in the past several days to week.  He reports that he has had some issues with leg swelling in the past and has previously been treated with oral diuretics.  He reports that these were stopped during her recent hospitalization and that he has been off of his diuretics since his last hospitalization.  He also has a history of DVT.  He is not currently experiencing any leg pain, chest pain, shortness of breath.  Right leg BKA Since hospital discharge, he has followed up with Dr. Sharol Given for his right transtibial amputation.  He reports no significant pain, swelling, erythema.  Diabetes He reports that he is currently taking long-acting and short acting medication.  He reports that his long-acting medication consists of 15-60 units of Lantus per night.  He reports that he takes 60 units of Lantus if his blood sugar is over 400 and takes 15 units if his blood sugar is under 400.  He reports a similar diabetes regimen for his short acting insulin, NovoLog.  For his short acting insulin, he takes 30 units of NovoLog as often as twice daily.  He decides whether or not he takes all 30 units based on his blood sugar although there is not a predetermined scale.  PERTINENT  PMH / PSH: Grade 1 diastolic dysfunction CHF, HTN, type II DM, recent AKI.  OBJECTIVE:   BP (!) 112/58   Pulse 53   Wt 172 lb 12.8 oz (78.4 kg)   SpO2 97%   BMI 24.79 kg/m    General: Seated comfortably in his wheelchair.  Responsive and interactive.  No acute distress.  Accompanied by his wife. Respiratory: Breathing comfortably on  room air.  No crackles/wheezing/stridor.  Lungs clear to auscultation bilaterally.  No respiratory distress. Cardiac: Regular rate and rhythm.  No M/R/G. Lower extremities: Right leg ending abruptly at the mid tibia.  Left leg with substantial swelling from the ankle to the knee.  Swelling of the ankle is 3+, swelling at the knee is about 1+.  Left foot with strong TP and DP pulses.  No evidence of ulceration or infection of the left foot.  No tenderness with palpation of the left calf or thigh.  ASSESSMENT/PLAN:   Essential hypertension -Continue metoprolol -Follow-up BMP  Leg swelling This is certainly a chronic problem although it is difficult to tell if it is changed dramatically recently.  Previous notes document well that he is previously been on Lasix for lower extremity swelling.  Since he has been off his Lasix since his recent hospitalization this is likely accumulation.  It does not seem to be a symptom of heart failure.  Lungs are clear and there is no elevated JVD.  I cannot rule out DVT based on my clinical exam.  We will follow up with lower extremity Doppler of the left side. -Follow-up Doppler  Type 2 diabetes mellitus with neurologic complication, with long-term current use of insulin ([place]) His current diabetes regimen seems unusual.  Mr. Oran Rein reports that he came up with this particular regimen on his own (with regard to determining how  much insulin he receives of his Lantus or NovoLog today).  We did not have time to fully discuss a better diabetes regimen for him so he was encouraged to follow-up with his primary care provider.     Matilde Haymaker, MD [place]

## 2020-01-28 ENCOUNTER — Ambulatory Visit (HOSPITAL_COMMUNITY)
Admission: RE | Admit: 2020-01-28 | Discharge: 2020-01-28 | Disposition: A | Discharge status: Home / Self Care | Payer: Medicare HMO | Source: Ambulatory Visit | Attending: Family Medicine | Admitting: Family Medicine

## 2020-01-28 DIAGNOSIS — M7989 Other specified soft tissue disorders: Secondary | ICD-10-CM | POA: Diagnosis not present

## 2020-01-28 LAB — BASIC METABOLIC PANEL
BUN/Creatinine Ratio: 18 (ref 10–24)
BUN: 17 mg/dL (ref 8–27)
CO2: 27 mmol/L (ref 20–29)
Calcium: 9.3 mg/dL (ref 8.6–10.2)
Chloride: 104 mmol/L (ref 96–106)
Creatinine, Ser: 0.97 mg/dL (ref 0.76–1.27)
GFR calc Af Amer: 95 mL/min/{1.73_m2} (ref >59)
GFR calc non Af Amer: 82 mL/min/{1.73_m2} (ref >59)
Glucose: 146 mg/dL — ABNORMAL HIGH (ref 65–99)
Potassium: 4.5 mmol/L (ref 3.5–5.2)
Sodium: 140 mmol/L (ref 134–144)

## 2020-01-28 LAB — CBC
Hematocrit: 31.2 % — ABNORMAL LOW (ref 37.5–51.0)
Hemoglobin: 10.5 g/dL — ABNORMAL LOW (ref 13.0–17.7)
MCH: 29.8 pg (ref 26.6–33.0)
MCHC: 33.7 g/dL (ref 31.5–35.7)
MCV: 89 fL (ref 79–97)
Platelets: 132 10*3/uL — ABNORMAL LOW (ref 150–450)
RBC: 3.52 x10E6/uL — ABNORMAL LOW (ref 4.14–5.80)
RDW: 13.1 % (ref 11.6–15.4)
WBC: 3.1 10*3/uL — ABNORMAL LOW (ref 3.4–10.8)

## 2020-01-28 NOTE — Progress Notes (Signed)
Please call Mr. Gadbois to let him know there is no evidence of clot in his leg and it is OK to simply use compression hose/socks for now.  His repeat labs look good and his kidneys have improved since hospital discharge.  Matilde Haymaker, MD

## 2020-01-31 ENCOUNTER — Ambulatory Visit (INDEPENDENT_AMBULATORY_CARE_PROVIDER_SITE_OTHER): Payer: No Typology Code available for payment source | Admitting: Physician Assistant

## 2020-01-31 ENCOUNTER — Encounter: Payer: Self-pay | Admitting: Physician Assistant

## 2020-01-31 DIAGNOSIS — I87323 Chronic venous hypertension (idiopathic) with inflammation of bilateral lower extremity: Secondary | ICD-10-CM

## 2020-01-31 NOTE — Progress Notes (Signed)
Office Visit Note   Patient: Adam Ray           Date of Birth: [birth year]           MRN: [id number] Visit Date: 01/31/2020              Requested by: [name], [place] [place],  [place] [address] PCP: [name], MD  Chief Complaint  Patient presents with  . Right Knee - Pain      HPI: Patient is 3 weeks status post below-knee amputation.  He has been having daily dressing changes.  He did fall which increased some of the bloody drainage.  Assessment & Plan: Visit Diagnoses: No diagnosis found.  Plan: Patient will obtain 3 XL shrinkers to use.  He did have some bleeding today so a dressing was applied with some compression.  I have given orders for dressing changes to give to the home health nurse.  With regards to the swelling in his other leg he was ruled out for DVT.  He should be wearing a compression stocking and he states he does have a compression stocking of his own to use.  They will obtain more shrinkers from [place] Instructions: No follow-ups on file.   Ortho Exam  Patient is alert, oriented, no adenopathy, well-dressed, normal affect, normal respiratory effort. Focused examination demonstrates healing amputation stump.  Overall well opposed wound edges with moderate soft tissue swelling no cellulitis no foul odor.  There is some bloody drainage.  This seems to be at the skin as some of this was coagulated with a silver nitrate stick.  Hemostasis was retrieved surgical staples in place  Imaging: No results found. No images are attached to the encounter.  Labs: Lab Results  Component Value Date   HGBA1C 8.7 (H) 01/19/2020   HGBA1C 7.2 (A) 10/28/2019   HGBA1C 7.6 (A) 05/31/2019   ESRSEDRATE 54 (H) 01/01/2020   ESRSEDRATE 8 01/29/2016   ESRSEDRATE 9 04/11/2012   CRP 2.6 (H) 01/29/2016   CRP 0.5 02/28/2014   REPTSTATUS 01/18/2020 FINAL 01/17/2020   REPTSTATUS 01/22/2020 FINAL 01/17/2020   REPTSTATUS  01/22/2020 FINAL 01/17/2020   GRAMSTAIN  04/11/2012    NO WBC SEEN RARE SQUAMOUS EPITHELIAL CELLS PRESENT MODERATE GRAM POSITIVE COCCI IN PAIRS RARE GRAM NEGATIVE RODS   GRAMSTAIN  04/11/2012    NO WBC SEEN RARE SQUAMOUS EPITHELIAL CELLS PRESENT MODERATE GRAM POSITIVE COCCI IN PAIRS RARE GRAM NEGATIVE RODS   CULT (A) 01/17/2020    <10,000 COLONIES/mL INSIGNIFICANT GROWTH Performed at [facility], [address], [place], [place] [address]    CULT  01/17/2020    NO GROWTH 5 DAYS Performed at [facility] [address], [place], [place] [address]    CULT  01/17/2020    NO GROWTH 5 DAYS Performed at [facility] [address] [place], [place] [address]    LABORGA NO GROWTH 2 DAYS 07/19/2014     Lab Results  Component Value Date   ALBUMIN 3.0 (L) 01/18/2020   ALBUMIN 3.4 (L) 01/17/2020   ALBUMIN 3.1 (L) 12/31/2019    No results found for: MG No results found for: VD25OH  No results found for: PREALBUMIN CBC EXTENDED Latest Ref Rng & Units 01/27/2020 01/19/2020 01/18/2020  WBC 3.4 - 10.8 x10E3/uL 3.1(L) 3.2(L) 3.1(L)  RBC 4.14 - 5.80 x10E6/uL 3.52(L) 3.42(L) 3.56(L)  HGB 13.0 - 17.7 g/dL 10.5(L) 9.9(L) 10.3(L)  HCT 37.5 -  51.0 % 31.2(L) 30.4(L) 32.7(L)  PLT 150 - 450 x10E3/uL 132(L) 167 167  NEUTROABS 1.7 - 7.7 K/uL - - -  LYMPHSABS 0.7 - 4.0 K/uL - - -     There is no height or weight on file to calculate BMI.  Orders:  No orders of the defined types were placed in this encounter.  No orders of the defined types were placed in this encounter.    Procedures: No procedures performed  Clinical Data: No additional findings.  ROS:  All other systems negative, except as noted in the HPI. Review of Systems  Objective: Vital Signs: There were no vitals taken for this visit.  Specialty Comments:  No specialty comments available.  PMFS History: Patient Active Problem List   Diagnosis Date Noted  . Coronary artery disease 01/18/2020    . AKI (acute kidney injury) ([place]) 01/17/2020  . Cutaneous abscess of right foot   . Cellulitis of right foot 01/01/2020  . Cellulitis 01/01/2020  . Nocturnal leg cramps 10/28/2019  . Hyperlipidemia 05/26/2019  . Edema leg 12/29/2018  . STEMI involving left circumflex coronary artery ([place]) 07/12/2018  . Status post transmetatarsal amputation of foot, right ([place]) 07/08/2018  . Limited mobility 05/12/2018  . Gingival foreign body 07/22/2017  . Anxiety state 06/02/2017  . Leg swelling 01/29/2017  . Idiopathic chronic venous hypertension of both lower extremities with inflammation 09/24/2016  . Testicular mass 04/18/2016  . Subacute osteomyelitis, right ankle and foot ([place]) 01/29/2016  . Diabetic polyneuropathy associated with type 2 diabetes mellitus ([place])   . Type 2 diabetes mellitus with neurologic complication, with long-term current use of insulin ([place]) 12/08/2015  . Pulmonary nodule 02/01/2015  . Depression 12/09/2013  . Warts, genital 08/20/2013  . 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 Diabetic retinopathy ([place]) 01/28/2013  . Sleep apnea 09/06/2010  . 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 Essential hypertension 11/09/2008  . COPD, mild ([place]) 10/06/2006  . SICKLE-CELL TRAIT 04/26/2006  . ERECTILE DYSFUNCTION 04/26/2006  . Tobacco abuse 04/26/2006  . PAD (peripheral artery disease) ([place]) 04/26/2006  . ALLERGIC RHINITIS 04/26/2006   Past Medical History:  Diagnosis Date  . Allergy   . Arthritis   . Bronchitis   . Cataract   . Chronic kidney disease   . Claudication [facility])    right foot ray resection  . Colon polyps    hyperplastic  . COPD (chronic obstructive pulmonary disease) ([place])   . Coronary artery disease   . Diabetes mellitus   . Genital warts   . Gout   . Hyperlipidemia   . Hypertension   . Osteomyelitis of third toe of right foot ([place])   . Pneumonia   . Status post amputation of toe of right foot ([place]) 09/24/2016    Family  History  Problem Relation Age of Onset  . Diabetes Mother   . Stroke Mother   . Heart failure Father   . Colon cancer Neg Hx   . Esophageal cancer Neg Hx   . Rectal cancer Neg Hx   . Stomach cancer Neg Hx     Past Surgical History:  Procedure Laterality Date  . AMPUTATION Right 01/31/2016   Procedure: Right 2nd Toe Amputation;  Surgeon: Newt Minion, MD;  Location: [facility];  Service: Orthopedics;  Laterality: Right;  . AMPUTATION Right 07/08/2018   Procedure: RIGHT TRANSMETATARSAL AMPUTATION;  Surgeon: Newt Minion, MD;  Location: [facility];  Service: Orthopedics;  Laterality: Right;  . AMPUTATION Right 01/05/2020  Procedure: RIGHT BELOW KNEE AMPUTATION;  Surgeon: Newt Minion, MD;  Location: [facility];  Service: Orthopedics;  Laterality: Right;  . CATARACT EXTRACTION     right eye  . COLONOSCOPY    . CORONARY STENT INTERVENTION N/A 07/12/2018   Procedure: CORONARY STENT INTERVENTION;  Surgeon: Troy Sine, MD;  Location: [facility];  Service: Cardiovascular;  Laterality: N/A;  . CORONARY/GRAFT ACUTE MI REVASCULARIZATION N/A 07/12/2018   Procedure: Coronary/Graft Acute MI Revascularization;  Surgeon: Troy Sine, MD;  Location: [facility];  Service: Cardiovascular;  Laterality: N/A;  . I & D EXTREMITY  04/11/2012   Procedure: IRRIGATION AND DEBRIDEMENT EXTREMITY;  Surgeon: Wylene Simmer, MD;  Location: [facility];  Service: Orthopedics;  Laterality: Right;  . LEFT HEART CATH AND CORONARY ANGIOGRAPHY N/A 07/12/2018   Procedure: LEFT HEART CATH AND CORONARY ANGIOGRAPHY;  Surgeon: Troy Sine, MD;  Location: [facility];  Service: Cardiovascular;  Laterality: N/A;  . Surgery left great toe    . Tear ducts bilateral eyes    . TRANSMETATARSAL AMPUTATION Right 07/08/2018   Social History   Occupational History  . Occupation: disabled  Tobacco Use  . Smoking status: Former Smoker    Packs/day: 0.30    Years: 48.00    Pack years: 14.40    Types: Cigars, Cigarettes     Start date: 07/02/1963    Quit date: 06/01/2019    Years since quitting: 0.6  . Smokeless tobacco: Former Systems developer  . Tobacco comment: wants to use electric cigarettes.  not interested in pills, worried about chantix side effects  Vaping Use  . Vaping Use: Former  Substance and Sexual Activity  . Alcohol use: Yes    Alcohol/week: 0.0 standard drinks    Comment: occassional use  . Drug use: No  . Sexual activity: Not on file

## 2020-02-01 ENCOUNTER — Other Ambulatory Visit: Payer: Self-pay

## 2020-02-01 ENCOUNTER — Encounter: Payer: Self-pay | Admitting: Family Medicine

## 2020-02-01 ENCOUNTER — Ambulatory Visit (INDEPENDENT_AMBULATORY_CARE_PROVIDER_SITE_OTHER): Payer: No Typology Code available for payment source | Admitting: Family Medicine

## 2020-02-01 DIAGNOSIS — E1141 Type 2 diabetes mellitus with diabetic mononeuropathy: Secondary | ICD-10-CM

## 2020-02-01 DIAGNOSIS — E1142 Type 2 diabetes mellitus with diabetic polyneuropathy: Secondary | ICD-10-CM | POA: Diagnosis not present

## 2020-02-01 DIAGNOSIS — Z794 Long term (current) use of insulin: Secondary | ICD-10-CM

## 2020-02-01 DIAGNOSIS — M7989 Other specified soft tissue disorders: Secondary | ICD-10-CM | POA: Diagnosis not present

## 2020-02-01 NOTE — Progress Notes (Signed)
  Date of Visit: 02/01/2020   SUBJECTIVE:   HPI:  Adam Ray presents today for routine follow up.  Diabetes - taking anywhere from 15 to 60 u of lantus daily, all depending on what his sugars are. He makes up doses himself based on what he gets when he checks his sugar. Willing to fill out sugar log. Has not seen [place] doctor in several months.  Lower extremity swelling - had doppler u/s which ruled out DVT bilaterally. He is s/p R leg BKA. L leg continues to be swollen especially around his ankle.  OBJECTIVE:   BP 118/72   Pulse 65   Wt 175 lb 12.8 oz (79.7 kg)   SpO2 99%   BMI 25.22 kg/m  Gen: no acute distress, pleasant, cooperative HEENT: normocephalic, atraumatic   Heart: regular rate and rhythm, no murmur Lungs: clear to auscultation bilaterally, normal work of breathing  Foot exam: Charcot deformity of L foot. Skin flaking and callousing present. Diminished sensation with monofilament testing on all areas tested of L plantar surface   ASSESSMENT/PLAN:   Health maintenance:  -foot exam done today  Diabetic polyneuropathy associated with type 2 diabetes mellitus ([place]) Foot exam today demonstrates dminished sensation Advised to check feet daily Asked him to schedule follow up with podiatry for routine foot care. Given he has already had one BKA, is at high risk for needing another amputation.  Leg swelling Doppler negative for DVT bilaterally Encouraged compression hose on L  Type 2 diabetes mellitus with neurologic complication, with long-term current use of insulin (HCC) Very challenging to make recommendations about his insulin as he does not take a consistent amount. Asked him to complete a chart showing what his sugars are and how much insulin he takes each day  FOLLOW UP: Follow up in a few weeks for diabetes  Schedule with podiatry  [name], [place]

## 2020-02-01 NOTE — Patient Instructions (Signed)
It was great to see you again today!  Fill out blood sugar and insulin log Come back and see me in a few weeks  Get in with the podiatrist  Call with any questions  Be well, Dr. [name]

## 2020-02-03 ENCOUNTER — Other Ambulatory Visit: Payer: Self-pay

## 2020-02-03 MED ORDER — ROSUVASTATIN CALCIUM 10 MG PO TABS: 10.0000 mg | ORAL_TABLET | Freq: Every day | ORAL | 0 refills | Status: DC

## 2020-02-03 NOTE — Telephone Encounter (Signed)
Received fax from pharmacy for request for 90 day supply of rosuvastatin. 30 day rx was sent to pharmacy on 7/21. Spoke with pharmacist. Patient has not picked up rx and rx is currently on hold as patient is requesting 3 month supply.   Forwarding to PCP and prescribing doctor.   Talbot Grumbling, RN

## 2020-02-06 NOTE — Assessment & Plan Note (Signed)
Doppler negative for DVT bilaterally Encouraged compression hose on L

## 2020-02-06 NOTE — Assessment & Plan Note (Signed)
Foot exam today demonstrates dminished sensation Advised to check feet daily Asked him to schedule follow up with podiatry for routine foot care. Given he has already had one BKA, is at high risk for needing another amputation.

## 2020-02-06 NOTE — Assessment & Plan Note (Signed)
Very challenging to make recommendations about his insulin as he does not take a consistent amount. Asked him to complete a chart showing what his sugars are and how much insulin he takes each day

## 2020-02-07 ENCOUNTER — Telehealth: Payer: Self-pay | Admitting: Physician Assistant

## 2020-02-07 NOTE — Telephone Encounter (Signed)
Received call from [place] with the [place] in [place]. She asked that the 01/31/2020 be faxed to her attention     Fax# is [phone]   Ph# is [phone] Ext: [phone]

## 2020-02-07 NOTE — Telephone Encounter (Signed)
Office note was faxed to listed number [phone] Attn:Mary at 1:28 pm.

## 2020-02-08 ENCOUNTER — Encounter: Payer: Self-pay | Admitting: Cardiovascular Disease

## 2020-02-08 ENCOUNTER — Ambulatory Visit (INDEPENDENT_AMBULATORY_CARE_PROVIDER_SITE_OTHER): Payer: No Typology Code available for payment source | Admitting: Cardiovascular Disease

## 2020-02-08 ENCOUNTER — Other Ambulatory Visit: Payer: Self-pay

## 2020-02-08 DIAGNOSIS — I739 Peripheral vascular disease, unspecified: Secondary | ICD-10-CM

## 2020-02-08 DIAGNOSIS — Q231 Congenital insufficiency of aortic valve: Secondary | ICD-10-CM | POA: Diagnosis not present

## 2020-02-08 DIAGNOSIS — Z720 Tobacco use: Secondary | ICD-10-CM | POA: Diagnosis not present

## 2020-02-08 DIAGNOSIS — I10 Essential (primary) hypertension: Secondary | ICD-10-CM

## 2020-02-08 DIAGNOSIS — I2121 ST elevation (STEMI) myocardial infarction involving left circumflex coronary artery: Secondary | ICD-10-CM

## 2020-02-08 DIAGNOSIS — M7989 Other specified soft tissue disorders: Secondary | ICD-10-CM

## 2020-02-08 NOTE — Progress Notes (Signed)
02/08/2020 Adam Ray   12-27-1955  [id number]  Primary Physician [name] Delorse Limber, MD Primary Cardiologist: Lorretta Harp MD Lupe Carney, [place]  HPI:  Adam Ray is a 64 y.o.  married Caucasian male father of 2 children, grandfather of 8 grandchildren who I saw him remotely for peripheral vascular valuation.  I last saw saw him in the office 05/26/2019.[name] KitchenHe was recently discharged from [facility] after having had a "STEMI" for an occluded circumflex coronary artery on 07/12/2018. 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 His risk factors include recently discontinue tobacco abuse having smoked the majority of his life and diabetes as well as hyperlipidemia. He does have a family history for heart disease with a father and brother both of who had myocardial infarction's. He never had an MI in the past. Dr. Claiborne Billings placed a synergy drug-eluting stent in his circumflex. The LAD and RCA were free of significant disease. He was discharged home on aspirin and Brilinta.   Since I saw him 9 months ago he continues to do well.  He has stopped smoking.  He is pretty much confined to wheelchair.  He did have a right BKA and has 2+ left lower extremity edema.  He denies chest pain or shortness of breath.  He has been fitted for prosthesis.  Current Meds  Medication Sig  . acetaminophen (TYLENOL) 500 MG tablet Take 1,000 mg by mouth in the morning and at bedtime.   [name] albuterol (VENTOLIN HFA) 108 (90 Base) MCG/ACT inhaler Inhale 2 puffs into the lungs every 4 (four) hours as needed for wheezing or shortness of breath.  [name] aspirin 81 MG chewable tablet Chew 81 mg by mouth daily.  . baclofen (LIORESAL) 10 MG tablet Take 10 mg by mouth 2 (two) times daily.   [name] CALCIUM PO Take 1 tablet by mouth daily.  . Cholecalciferol (VITAMIN D3) 50 MCG (2000 UT) TABS Take 2,000 Units by mouth daily.  . famotidine (PEPCID) 20 MG tablet Take 1 tablet  (20 mg total) by mouth 2 (two) times daily.  . Insulin Glargine (BASAGLAR KWIKPEN) 100 UNIT/ML Inject 0.15 mLs (15 Units total) into the skin daily.  . metoprolol tartrate (LOPRESSOR) 25 mg/10 mL SUSP Take by mouth 2 (two) times daily.  . Multiple Vitamin (MULTIVITAMIN WITH MINERALS) TABS tablet Take 1 tablet by mouth daily.  . nitroGLYCERIN (NITROSTAT) 0.4 MG SL tablet Place 1 tablet (0.4 mg total) under the tongue every 5 (five) minutes x 3 doses as needed for chest pain.  . rosuvastatin (CRESTOR) 10 MG tablet Take 1 tablet (10 mg total) by mouth daily at 6 PM.  . sildenafil (VIAGRA) 100 MG tablet Take 100 mg by mouth daily as needed for erectile dysfunction.  . ticagrelor (BRILINTA) 90 MG TABS tablet Take 1 tablet (90 mg total) by mouth 2 (two) times daily.  . [DISCONTINUED] metoprolol tartrate (LOPRESSOR) 25 mg/10 mL SUSP Take 2.5 mLs (6.25 mg total) by mouth 2 (two) times daily.     Allergies  Allergen Reactions  . Codeine Other (See Comments)    Heart attack.    Social History   Socioeconomic History  . Marital status: Married    Spouse name: Not on file  . Number of children: 3  . Years of education: Not on file  . Highest education level: Not on file  Occupational History  . Occupation: disabled  Tobacco Use  . Smoking status: Former  Smoker    Packs/day: 0.30    Years: 48.00    Pack years: 14.40    Types: Cigars, Cigarettes    Start date: 07/02/1963    Quit date: 06/01/2019    Years since quitting: 0.6  . Smokeless tobacco: Former Systems developer  . Tobacco comment: wants to use electric cigarettes.  not interested in pills, worried about chantix side effects  Vaping Use  . Vaping Use: Former  Substance and Sexual Activity  . Alcohol use: Yes    Alcohol/week: 0.0 standard drinks    Comment: occassional use  . Drug use: No  . Sexual activity: Not on file  Other Topics Concern  . Not on file  Social History Narrative  . Not on file   Social Determinants of Health    Financial Resource Strain:   . Difficulty of Paying Living Expenses:   Food Insecurity:   . Worried About Charity fundraiser in the Last Year:   . Arboriculturist in the Last Year:   Transportation Needs:   . Film/video editor (Medical):   [name] Lack of Transportation (Non-Medical):   Physical Activity:   . Days of Exercise per Week:   . Minutes of Exercise per Session:   Stress:   . Feeling of Stress :   Social Connections:   . Frequency of Communication with Friends and Family:   . Frequency of Social Gatherings with Friends and Family:   . Attends Religious Services:   . Active Member of Clubs or Organizations:   . Attends Archivist Meetings:   [name] Marital Status:   Intimate Partner Violence:   . Fear of Current or Ex-Partner:   . Emotionally Abused:   [name] Physically Abused:   . Sexually Abused:      Review of Systems: General: negative for chills, fever, night sweats or weight changes.  Cardiovascular: negative for chest pain, dyspnea on exertion, edema, orthopnea, palpitations, paroxysmal nocturnal dyspnea or shortness of breath Dermatological: negative for rash Respiratory: negative for cough or wheezing Urologic: negative for hematuria Abdominal: negative for nausea, vomiting, diarrhea, bright red blood per rectum, melena, or hematemesis Neurologic: negative for visual changes, syncope, or dizziness All other systems reviewed and are otherwise negative except as noted above.    Blood pressure (!) 89/51, pulse (!) 56, SpO2 98 %.  General appearance: alert and no distress Neck: no adenopathy, no carotid bruit, no JVD, supple, symmetrical, trachea midline and thyroid not enlarged, symmetric, no tenderness/mass/nodules Lungs: clear to auscultation bilaterally Heart: regular rate and rhythm, S1, S2 normal, no murmur, click, rub or gallop Extremities: Right BKA, 2+ left lower extremity edema Pulses: Right BKA Skin: Skin color, texture, turgor normal. No  rashes or lesions Neurologic: Alert and oriented X 3, normal strength and tone. Normal symmetric reflexes. Normal coordination and gait  EKG not performed today  ASSESSMENT AND PLAN:   Tobacco abuse Discontinue tobacco abuse  Essential hypertension History of essential hypertension a blood pressure measured today at 90/50.  He currently is not on antihypertensive medications.  BICUSPID AORTIC VALVE 2D echo performed 07/12/2018 showed a bicuspid aortic valve without evidence of stenosis and normal LV function.  PAD (peripheral artery disease) (HCC) History of PAD status post right BKA by Dr. Sharol Given in the past currently wearing a shrinker and scheduled to see Dr. Sharol Given tomorrow  Leg swelling 2+ left lower extremity edema probably related to dependence.  His EF is normal.  I am hesitant to put him  on a diuretic because of his already low blood pressure.  I suggested potential compression stockings or elevation.  STEMI involving left circumflex coronary artery (HCC) History of CAD status post acute STEMI with occluded circumflex 07/12/2018.  He had a drug-eluting stent placed by Dr. Claiborne Billings at that time.  LAD and RCA were free of significant disease.  His EF was normal.  He denies chest pain or shortness of breath.      Lorretta Harp MD FACP,FACC,FAHA, [facility] 02/08/2020 3:08 PM

## 2020-02-08 NOTE — Assessment & Plan Note (Signed)
History of CAD status post acute STEMI with occluded circumflex 07/12/2018.  He had a drug-eluting stent placed by Dr. Claiborne Billings at that time.  LAD and RCA were free of significant disease.  His EF was normal.  He denies chest pain or shortness of breath.

## 2020-02-08 NOTE — Assessment & Plan Note (Signed)
History of essential hypertension a blood pressure measured today at 90/50.  He currently is not on antihypertensive medications.

## 2020-02-08 NOTE — Assessment & Plan Note (Signed)
History of PAD status post right BKA by Dr. Sharol Given in the past currently wearing a shrinker and scheduled to see Dr. Sharol Given tomorrow

## 2020-02-08 NOTE — Assessment & Plan Note (Signed)
2D echo performed 07/12/2018 showed a bicuspid aortic valve without evidence of stenosis and normal LV function.

## 2020-02-08 NOTE — Assessment & Plan Note (Signed)
2+ left lower extremity edema probably related to dependence.  His EF is normal.  I am hesitant to put him on a diuretic because of his already low blood pressure.  I suggested potential compression stockings or elevation.

## 2020-02-08 NOTE — Assessment & Plan Note (Signed)
Discontinue tobacco abuse °

## 2020-02-08 NOTE — Patient Instructions (Signed)
Medication Instructions:  Your physician recommends that you continue on your current medications as directed. Please refer to the Current Medication list given to you today.  *If you need a refill on your cardiac medications before your next appointment, please call your pharmacy*   Follow-Up: At [facility], you and your health needs are our priority.  As part of our continuing mission to provide you with exceptional heart care, we have created designated Provider Care Teams.  These Care Teams include your primary Cardiologist (physician) and Advanced Practice Providers (APPs -  Physician Assistants and Nurse Practitioners) who all work together to provide you with the care you need, when you need it.  We recommend signing up for the patient portal called "MyChart".  Sign up information is provided on this After Visit Summary.  MyChart is used to connect with patients for Virtual Visits (Telemedicine).  Patients are able to view lab/test results, encounter notes, upcoming appointments, etc.  Non-urgent messages can be sent to your provider as well.   To learn more about what you can do with MyChart, go to [email or web address].    Your next appointment:   6 month(s)  The format for your next appointment:   In Person  Provider:    Luke Kilroy, PA-C  Callie Goodrich, PA-C  Jesse Cleaver, FNP  12 months with Dr. Berry   

## 2020-02-09 ENCOUNTER — Telehealth: Payer: Self-pay

## 2020-02-09 ENCOUNTER — Encounter: Payer: Self-pay | Admitting: Physician Assistant

## 2020-02-09 ENCOUNTER — Ambulatory Visit (INDEPENDENT_AMBULATORY_CARE_PROVIDER_SITE_OTHER): Payer: No Typology Code available for payment source | Admitting: Physician Assistant

## 2020-02-09 VITALS — Ht 70.0 in | Wt 175.0 lb

## 2020-02-09 DIAGNOSIS — Z89511 Acquired absence of right leg below knee: Secondary | ICD-10-CM

## 2020-02-09 NOTE — Telephone Encounter (Signed)
Adam Ray with [facility] called and stated she needs wound care orders and OV note from today faxed to [phone]  If any questions can call her back @ [phone]

## 2020-02-09 NOTE — Telephone Encounter (Signed)
Note from todays visit was faxed to [facility] advised in fax no new wound care orders. To wash limb with soap and water daily and apply shrinker with direct skin contact to BKA daily. To call with questions.

## 2020-02-09 NOTE — Telephone Encounter (Signed)
Faxed 01/31/20 office note to [facility] in [place] per their request [phone]

## 2020-02-09 NOTE — Progress Notes (Signed)
Post-Op Visit Note   Patient: Adam Ray           Date of Birth: 1955/08/04           MRN: [id number] Visit Date: 02/09/2020 PCP: [name], MD  Chief Complaint:  Chief Complaint  Patient presents with  . Right Leg - Routine Post Op    01/05/20 right BKA     HPI:  HPI The patient is a 64 year old gentleman seen today status post right below-knee amputation on July 7 he does have one ulcerated area to the medial aspect of his incision overall is doing well he is wearing an Ace wrap and a shrinker dressing changes  Ortho Exam On examination of the incision this is well-healed except for an ulcerated area to the medial aspect of his incision this is 2 cm in length 1 cm in width covered with eschar there is no active drainage no surrounding erythema or maceration  Visit Diagnoses:  1. Acquired absence of right leg below knee [facility])     Plan: Iodosorb dressing applied today Staples harvested today.  He will follow-up once more in 2 weeks he may wear his shrinker with direct skin contact  Follow-Up Instructions: Return in about 2 weeks (around 02/23/2020).   Imaging: No results found.  Orders:  No orders of the defined types were placed in this encounter.  No orders of the defined types were placed in this encounter.    PMFS History: Patient Active Problem List   Diagnosis Date Noted  . Acquired absence of right leg below knee ([place]) 02/09/2020  . Coronary artery disease 01/18/2020  . AKI (acute kidney injury) ([place]) 01/17/2020  . Nocturnal leg cramps 10/28/2019  . Hyperlipidemia 05/26/2019  . Edema leg 12/29/2018  . STEMI involving left circumflex coronary artery ([place]) 07/12/2018  . Status post transmetatarsal amputation of foot, right ([place]) 07/08/2018  . Limited mobility 05/12/2018  . Gingival foreign body 07/22/2017  . Anxiety state 06/02/2017  . Leg swelling 01/29/2017  . Idiopathic chronic venous hypertension of both lower extremities with inflammation  09/24/2016  . Testicular mass 04/18/2016  . Subacute osteomyelitis, right ankle and foot ([place]) 01/29/2016  . Diabetic polyneuropathy associated with type 2 diabetes mellitus ([place])   . Type 2 diabetes mellitus with neurologic complication, with long-term current use of insulin ([place]) 12/08/2015  . Pulmonary nodule 02/01/2015  . Depression 12/09/2013  . Warts, genital 08/20/2013  . 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 Diabetic retinopathy ([place]) 01/28/2013  . Sleep apnea 09/06/2010  . 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 Essential hypertension 11/09/2008  . COPD, mild ([place]) 10/06/2006  . SICKLE-CELL TRAIT 04/26/2006  . ERECTILE DYSFUNCTION 04/26/2006  . Tobacco abuse 04/26/2006  . PAD (peripheral artery disease) ([place]) 04/26/2006  . ALLERGIC RHINITIS 04/26/2006   Past Medical History:  Diagnosis Date  . Allergy   . Arthritis   . Bronchitis   . Cataract   . Chronic kidney disease   . Claudication [facility])    right foot ray resection  . Colon polyps    hyperplastic  . COPD (chronic obstructive pulmonary disease) ([place])   . Coronary artery disease   . Diabetes mellitus   . Genital warts   . Gout   . Hyperlipidemia   . Hypertension   . Osteomyelitis of third toe of right foot ([place])   . Pneumonia   . Status post amputation of toe of right foot ([place]) 09/24/2016    Family History  Problem Relation Age of Onset  . Diabetes Mother   . Stroke Mother   . Heart failure Father   . Colon cancer Neg Hx   . Esophageal cancer Neg Hx   . Rectal cancer Neg Hx   . Stomach cancer Neg Hx     Past Surgical History:  Procedure Laterality Date  . AMPUTATION Right 01/31/2016   Procedure: Right 2nd Toe Amputation;  Surgeon: Newt Minion, MD;  Location: [place];  Service: Orthopedics;  Laterality: Right;  . AMPUTATION Right 07/08/2018   Procedure: RIGHT TRANSMETATARSAL AMPUTATION;  Surgeon: Newt Minion, MD;  Location: [place];  Service: Orthopedics;  Laterality: Right;   . AMPUTATION Right 01/05/2020   Procedure: RIGHT BELOW KNEE AMPUTATION;  Surgeon: Newt Minion, MD;  Location: [place];  Service: Orthopedics;  Laterality: Right;  . CATARACT EXTRACTION     right eye  . COLONOSCOPY    . CORONARY STENT INTERVENTION N/A 07/12/2018   Procedure: CORONARY STENT INTERVENTION;  Surgeon: Troy Sine, MD;  Location: [place] [facility];  Service: Cardiovascular;  Laterality: N/A;  . CORONARY/GRAFT ACUTE MI REVASCULARIZATION N/A 07/12/2018   Procedure: Coronary/Graft Acute MI Revascularization;  Surgeon: Troy Sine, MD;  Location: [place] [facility];  Service: Cardiovascular;  Laterality: N/A;  . I & D EXTREMITY  04/11/2012   Procedure: IRRIGATION AND DEBRIDEMENT EXTREMITY;  Surgeon: Wylene Simmer, MD;  Location: [facility];  Service: Orthopedics;  Laterality: Right;  . LEFT HEART CATH AND CORONARY ANGIOGRAPHY N/A 07/12/2018   Procedure: LEFT HEART CATH AND CORONARY ANGIOGRAPHY;  Surgeon: Troy Sine, MD;  Location: [place] [facility];  Service: Cardiovascular;  Laterality: N/A;  . Surgery left great toe    . Tear ducts bilateral eyes    . TRANSMETATARSAL AMPUTATION Right 07/08/2018   Social History   Occupational History  . Occupation: disabled  Tobacco Use  . Smoking status: Former Smoker    Packs/day: 0.30    Years: 48.00    Pack years: 14.40    Types: Cigars, Cigarettes    Start date: 07/02/1963    Quit date: 06/01/2019    Years since quitting: 0.6  . Smokeless tobacco: Former Systems developer  . Tobacco comment: wants to use electric cigarettes.  not interested in pills, worried about chantix side effects  Vaping Use  . Vaping Use: Former  Substance and Sexual Activity  . Alcohol use: Yes    Alcohol/week: 0.0 standard drinks    Comment: occassional use  . Drug use: No  . Sexual activity: Not on file

## 2020-02-14 LAB — HM DIABETES EYE EXAM

## 2020-02-15 ENCOUNTER — Telehealth: Payer: Self-pay

## 2020-02-15 NOTE — Telephone Encounter (Signed)
Adam Ray with [facility] would like a call back to discuss patient's wound on right leg and a treatment plan for patient.  Stated that patient has 2 staples that were left in his right leg and patient is having some drainage.  Cb# [phone].  Please advise.  Thank you.

## 2020-02-15 NOTE — Telephone Encounter (Signed)
Adam Ray @ [facility] was called and she stated; Patient has necrotic tissue on wound and has been using neosporin on wound. Would like to know if patient could use santyl on wound and if they could remove the rest of staples that are left in. Stated patient has eschar tissue and small opening with drainage. I discussed concerns with NP and she would like for patient to be seen in our office on 02/16/20. Patient will be called to get scheduled.

## 2020-02-16 ENCOUNTER — Ambulatory Visit: Payer: No Typology Code available for payment source | Admitting: Physician Assistant

## 2020-02-17 ENCOUNTER — Encounter: Payer: Self-pay | Admitting: Physician Assistant

## 2020-02-17 ENCOUNTER — Other Ambulatory Visit: Payer: Self-pay

## 2020-02-17 ENCOUNTER — Ambulatory Visit (INDEPENDENT_AMBULATORY_CARE_PROVIDER_SITE_OTHER): Payer: No Typology Code available for payment source | Admitting: Physician Assistant

## 2020-02-17 VITALS — Ht 70.0 in | Wt 175.0 lb

## 2020-02-17 DIAGNOSIS — T8130XA Disruption of wound, unspecified, initial encounter: Secondary | ICD-10-CM

## 2020-02-17 NOTE — Progress Notes (Signed)
Office Visit Note   Patient: Adam Ray           Date of Birth: May 16, 1956           MRN: [id number] Visit Date: 02/17/2020              Requested by: [name], [place],  [place] [address] PCP: [name], MD  Chief Complaint  Patient presents with  . Right Leg - Routine Post Op    01/05/20 right BKA       HPI: The patient is 8 weeks s/p  Right Below Knee Amputation . He has been slow to heal and has continued medial wound dehiscence  Assessment & Plan: Visit Diagnoses: No diagnosis found.  Plan: Plan for Right Below Knee Amputation revision. Discussed Recovery.  This could be done as an outpatient next week  Follow-Up Instructions: No follow-ups on file.   Ortho Exam  Patient is alert, oriented, no adenopathy, well-dressed, normal affect, normal respiratory effort. Right below-knee amputation stump wound dehiscence on the medial side with exposed subcutaneous fat.  Wound dehiscence is approximately 6 to 7 cm x 4 cm.  No surrounding cellulitis.  There is necrotic skin  Lungs clear heart regular rate and rhythm  Imaging: No results found. No images are attached to the encounter.  Labs: Lab Results  Component Value Date   HGBA1C 8.7 (H) 01/19/2020   HGBA1C 7.2 (A) 10/28/2019   HGBA1C 7.6 (A) 05/31/2019   ESRSEDRATE 54 (H) 01/01/2020   ESRSEDRATE 8 01/29/2016   ESRSEDRATE 9 04/11/2012   CRP 2.6 (H) 01/29/2016   CRP 0.5 02/28/2014   REPTSTATUS 01/18/2020 FINAL 01/17/2020   REPTSTATUS 01/22/2020 FINAL 01/17/2020   REPTSTATUS 01/22/2020 FINAL 01/17/2020   GRAMSTAIN  04/11/2012    NO WBC SEEN RARE SQUAMOUS EPITHELIAL CELLS PRESENT MODERATE GRAM POSITIVE COCCI IN PAIRS RARE GRAM NEGATIVE RODS   GRAMSTAIN  04/11/2012    NO WBC SEEN RARE SQUAMOUS EPITHELIAL CELLS PRESENT MODERATE GRAM POSITIVE COCCI IN PAIRS RARE GRAM NEGATIVE RODS   CULT (A) 01/17/2020    <10,000 COLONIES/mL INSIGNIFICANT GROWTH Performed at  [facility], [address] [place], [place] [address]    CULT  01/17/2020    NO GROWTH 5 DAYS Performed at [facility] [address], [place], [place] [address]    CULT  01/17/2020    NO GROWTH 5 DAYS Performed at [facility] [address], [place], [place] [address]    LABORGA NO GROWTH 2 DAYS 07/19/2014     Lab Results  Component Value Date   ALBUMIN 3.0 (L) 01/18/2020   ALBUMIN 3.4 (L) 01/17/2020   ALBUMIN 3.1 (L) 12/31/2019    No results found for: MG No results found for: VD25OH  No results found for: PREALBUMIN CBC EXTENDED Latest Ref Rng & Units 01/27/2020 01/19/2020 01/18/2020  WBC 3.4 - 10.8 x10E3/uL 3.1(L) 3.2(L) 3.1(L)  RBC 4.14 - 5.80 x10E6/uL 3.52(L) 3.42(L) 3.56(L)  HGB 13.0 - 17.7 g/dL 10.5(L) 9.9(L) 10.3(L)  HCT 37.5 - 51.0 % 31.2(L) 30.4(L) 32.7(L)  PLT 150 - 450 x10E3/uL 132(L) 167 167  NEUTROABS 1.7 - 7.7 K/uL - - -  LYMPHSABS 0.7 - 4.0 K/uL - - -     Body mass index is 25.11 kg/m.  Orders:  No orders of the defined types were placed in this encounter.  No orders of the defined types were placed in this encounter.  Procedures: No procedures performed  Clinical Data: No additional findings.  ROS:  All other systems negative, except as noted in the HPI. Review of Systems  Objective: Vital Signs: Ht 5\' 10"  (1.778 m)   Wt 175 lb (79.4 kg)   BMI 25.11 kg/m   Specialty Comments:  No specialty comments available.  PMFS History: Patient Active Problem List   Diagnosis Date Noted  . Acquired absence of right leg below knee ([place]) 02/09/2020  . Coronary artery disease 01/18/2020  . AKI (acute kidney injury) ([place]) 01/17/2020  . Nocturnal leg cramps 10/28/2019  . Hyperlipidemia 05/26/2019  . Edema leg 12/29/2018  . STEMI involving left circumflex coronary artery ([place]) 07/12/2018  . Status post transmetatarsal amputation of foot, right ([place]) 07/08/2018  . Limited mobility 05/12/2018  . Gingival foreign body  07/22/2017  . Anxiety state 06/02/2017  . Leg swelling 01/29/2017  . Idiopathic chronic venous hypertension of both lower extremities with inflammation 09/24/2016  . Testicular mass 04/18/2016  . Subacute osteomyelitis, right ankle and foot ([place]) 01/29/2016  . Diabetic polyneuropathy associated with type 2 diabetes mellitus ([place])   . Type 2 diabetes mellitus with neurologic complication, with long-term current use of insulin ([place]) 12/08/2015  . Pulmonary nodule 02/01/2015  . Depression 12/09/2013  . Warts, genital 08/20/2013  . 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 Diabetic retinopathy ([place]) 01/28/2013  . Sleep apnea 09/06/2010  . 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 Essential hypertension 11/09/2008  . COPD, mild ([place]) 10/06/2006  . SICKLE-CELL TRAIT 04/26/2006  . ERECTILE DYSFUNCTION 04/26/2006  . Tobacco abuse 04/26/2006  . PAD (peripheral artery disease) ([place]) 04/26/2006  . ALLERGIC RHINITIS 04/26/2006   Past Medical History:  Diagnosis Date  . Allergy   . Arthritis   . Bronchitis   . Cataract   . Chronic kidney disease   . Claudication [facility])    right foot ray resection  . Colon polyps    hyperplastic  . COPD (chronic obstructive pulmonary disease) ([place])   . Coronary artery disease   . Diabetes mellitus   . Genital warts   . Gout   . Hyperlipidemia   . Hypertension   . Osteomyelitis of third toe of right foot ([place])   . Pneumonia   . Status post amputation of toe of right foot ([place]) 09/24/2016    Family History  Problem Relation Age of Onset  . Diabetes Mother   . Stroke Mother   . Heart failure Father   . Colon cancer Neg Hx   . Esophageal cancer Neg Hx   . Rectal cancer Neg Hx   . Stomach cancer Neg Hx     Past Surgical History:  Procedure Laterality Date  . AMPUTATION Right 01/31/2016   Procedure: Right 2nd Toe Amputation;  Surgeon: Newt Minion, MD;  Location: [place];  Service: Orthopedics;  Laterality: Right;  . AMPUTATION  Right 07/08/2018   Procedure: RIGHT TRANSMETATARSAL AMPUTATION;  Surgeon: Newt Minion, MD;  Location: [place];  Service: Orthopedics;  Laterality: Right;  . AMPUTATION Right 01/05/2020   Procedure: RIGHT BELOW KNEE AMPUTATION;  Surgeon: Newt Minion, MD;  Location: [facility];  Service: Orthopedics;  Laterality: Right;  . CATARACT EXTRACTION     right eye  . COLONOSCOPY    . CORONARY STENT INTERVENTION N/A 07/12/2018   Procedure: CORONARY STENT INTERVENTION;  Surgeon: Troy Sine, MD;  Location: [place] [facility];  Service: Cardiovascular;  Laterality: N/A;  . CORONARY/GRAFT ACUTE MI REVASCULARIZATION N/A  07/12/2018   Procedure: Coronary/Graft Acute MI Revascularization;  Surgeon: Troy Sine, MD;  Location: [place] [facility];  Service: Cardiovascular;  Laterality: N/A;  . I & D EXTREMITY  04/11/2012   Procedure: IRRIGATION AND DEBRIDEMENT EXTREMITY;  Surgeon: Wylene Simmer, MD;  Location: [facility];  Service: Orthopedics;  Laterality: Right;  . LEFT HEART CATH AND CORONARY ANGIOGRAPHY N/A 07/12/2018   Procedure: LEFT HEART CATH AND CORONARY ANGIOGRAPHY;  Surgeon: Troy Sine, MD;  Location: [facility] [facility];  Service: Cardiovascular;  Laterality: N/A;  . Surgery left great toe    . Tear ducts bilateral eyes    . TRANSMETATARSAL AMPUTATION Right 07/08/2018   Social History   Occupational History  . Occupation: disabled  Tobacco Use  . Smoking status: Former Smoker    Packs/day: 0.30    Years: 48.00    Pack years: 14.40    Types: Cigars, Cigarettes    Start date: 07/02/1963    Quit date: 06/01/2019    Years since quitting: 0.7  . Smokeless tobacco: Former Systems developer  . Tobacco comment: wants to use electric cigarettes.  not interested in pills, worried about chantix side effects  Vaping Use  . Vaping Use: Former  Substance and Sexual Activity  . Alcohol use: Yes    Alcohol/week: 0.0 standard drinks    Comment: occassional use  . Drug use: No  . Sexual activity: Not on file

## 2020-02-18 ENCOUNTER — Ambulatory Visit: Payer: No Typology Code available for payment source | Admitting: Cardiovascular Disease

## 2020-02-19 ENCOUNTER — Inpatient Hospital Stay (HOSPITAL_COMMUNITY): Admission: RE | Admit: 2020-02-19 | Payer: No Typology Code available for payment source | Source: Ambulatory Visit

## 2020-02-21 ENCOUNTER — Other Ambulatory Visit: Payer: Self-pay | Admitting: Physician Assistant

## 2020-02-22 ENCOUNTER — Ambulatory Visit (INDEPENDENT_AMBULATORY_CARE_PROVIDER_SITE_OTHER): Payer: No Typology Code available for payment source | Admitting: Podiatrist

## 2020-02-22 ENCOUNTER — Encounter: Payer: Self-pay | Admitting: Podiatrist

## 2020-02-22 ENCOUNTER — Other Ambulatory Visit (HOSPITAL_COMMUNITY)
Admission: RE | Admit: 2020-02-22 | Discharge: 2020-02-22 | Disposition: A | Discharge status: Home / Self Care | Payer: No Typology Code available for payment source | Source: Ambulatory Visit | Attending: Orthopedic Surgery | Admitting: Orthopedic Surgery

## 2020-02-22 ENCOUNTER — Encounter: Payer: Self-pay | Admitting: Family Medicine

## 2020-02-22 ENCOUNTER — Other Ambulatory Visit: Payer: Self-pay

## 2020-02-22 ENCOUNTER — Ambulatory Visit (INDEPENDENT_AMBULATORY_CARE_PROVIDER_SITE_OTHER): Payer: No Typology Code available for payment source | Admitting: Family Medicine

## 2020-02-22 ENCOUNTER — Encounter (HOSPITAL_COMMUNITY): Payer: Self-pay | Admitting: Orthopedic Surgery

## 2020-02-22 VITALS — BP 138/60 | HR 56

## 2020-02-22 DIAGNOSIS — L603 Nail dystrophy: Secondary | ICD-10-CM

## 2020-02-22 DIAGNOSIS — I739 Peripheral vascular disease, unspecified: Secondary | ICD-10-CM

## 2020-02-22 DIAGNOSIS — Z20822 Contact with and (suspected) exposure to covid-19: Secondary | ICD-10-CM | POA: Insufficient documentation

## 2020-02-22 DIAGNOSIS — Z794 Long term (current) use of insulin: Secondary | ICD-10-CM

## 2020-02-22 DIAGNOSIS — L84 Corns and callosities: Secondary | ICD-10-CM

## 2020-02-22 DIAGNOSIS — E1141 Type 2 diabetes mellitus with diabetic mononeuropathy: Secondary | ICD-10-CM

## 2020-02-22 DIAGNOSIS — Z01812 Encounter for preprocedural laboratory examination: Secondary | ICD-10-CM | POA: Diagnosis present

## 2020-02-22 DIAGNOSIS — Z899 Acquired absence of limb, unspecified: Secondary | ICD-10-CM

## 2020-02-22 LAB — SARS CORONAVIRUS 2 (TAT 6-24 HRS): SARS Coronavirus 2: NEGATIVE

## 2020-02-22 NOTE — Progress Notes (Signed)
[name] asked me to speak to his wife- [name], [name] is in the room also. [name] has not had any chest pain or shortness of breath. [name] tested negative for Covid 02/19/20. Patient has only been to medical appointments since tested.  [name] has type II diabetes. [name] reports that CBG's can run from 80 - 400. Patient was seen by his PCP- she reduced Lantus dose to 40 units bid. I instructed that [name] take 20 units at hs.  In am, if CBG > 70 take Lantus 40 units. If CBG if greater than 220, take 1/2 of sliding dose Novolog.  I instructed patient to check CBG after awaking and every 2 hours until arrival  to the hospital.  I Instructed patient if CBG is less than 70 to drink 1/2 cup of a clear juice. Recheck CBG in 15 minutes then call pre- op desk at [phone] for further instructions. If scheduled to receive Insulin, do not take Insulin

## 2020-02-22 NOTE — Progress Notes (Signed)
  Date of Visit: 02/22/2020   SUBJECTIVE:   HPI:  Adam Ray presents today for follow up of diabetes.  Diabetes - filled out sugar and insulin log as I requested. Currently taking lantus 60u every morning, and typically 40 of novolog as well, but sometimes gives himself additional lantus or novolog without any identifiable pattern. Has evidently not written down every single sugar he gets.  Of note is having revision of his BKA tomorrow. Going for preop COVID testing today.   OBJECTIVE:   BP 138/60   Pulse (!) 56   SpO2 99%  Gen: no acute distress, pleasant cooperative HEENT: normocephalic, atraumatic  Heart: regular rate and rhythm, no murmur Lungs: clear to auscultation bilaterally normal work of breathing  Neuro: alert, speech normal Ext: necrotic R BKA stump with small amt of serosanguinous drainage, no odor or pus      ASSESSMENT/PLAN:   Diabetes Difficult to make adjustments as he is not using a predefined insulin regimen Gave instructions to take lantus 40u twice daily, and check sugar 3-4 times a day. Given sliding scale for novolog to use at meals (eats just breakfast and dinner daily) Advised to start this regimen once he is stable and at home after his surgery  R BKA stump necrotic, undergoing revision tomorrow. Follow up with me in a few weeks after his surgery   [name], [place]

## 2020-02-22 NOTE — Patient Instructions (Signed)
Take lantus 40 units twice a day, every day.  Check sugar 3-4 times a day. Write these down like you have been.  With breakfast and dinner, give yourself novolog based on the following blood sugars.  Blood Sugar & Insulin dose: <80         no novolog, call doctor 81-120    no novolog 121-150  4 units novolog 151-200  8 units novolog 201-250  12 units novolog 251-300  16 units novolog 301-350  20 units novolog 351-400  25 units novolog >400       30 units novolog, call doctor   Follow up with me in a few weeks  Be well, Dr. [name]

## 2020-02-22 NOTE — Progress Notes (Signed)
Pt's wife called back to let us know they are unable to change times d/t transportation already set up. Called OR Desk and Denese Killings is aware.

## 2020-02-22 NOTE — Progress Notes (Signed)
Chief Complaint  Patient presents with   Callouses    L plantar forefoot submet 1.   Nail Problem    Onychomycosis. Requests nail trim.   Diabetes    Most recent HgbA1c on file = 8.7. Recent R BKA on 01/05/2020.      HPI: This patient returns to the office for at risk foot care.  This patient requires this care by a professional since this patient will be at risk due to having poor circulation, neuropathy, and a recent right below knee amputation on 01/05/2020 that will be revised tomorrow 02/23/2020.  He has a painful callus of the first metatarsal head and toenails that need to be trimmed.     Patient Active Problem List   Diagnosis Date Noted   Acquired absence of right leg below knee ([place]) 02/09/2020   Coronary artery disease 01/18/2020   AKI (acute kidney injury) ([place]) 01/17/2020   Nocturnal leg cramps 10/28/2019   Hyperlipidemia 05/26/2019   Edema leg 12/29/2018   STEMI involving left circumflex coronary artery ([place]) 07/12/2018   Status post transmetatarsal amputation of foot, right ([place]) 07/08/2018   Limited mobility 05/12/2018   Gingival foreign body 07/22/2017   Anxiety state 06/02/2017   Leg swelling 01/29/2017   Idiopathic chronic venous hypertension of both lower extremities with inflammation 09/24/2016   Testicular mass 04/18/2016   Subacute osteomyelitis, right ankle and foot ([place]) 01/29/2016   Diabetic polyneuropathy associated with type 2 diabetes mellitus ([place])    Type 2 diabetes mellitus with neurologic complication, with long-term current use of insulin ([place]) 12/08/2015   Pulmonary nodule 02/01/2015   Depression 12/09/2013   Warts, genital 08/20/2013   Moderate nonproliferative diabetic retinopathy(362.05) 05/26/2013   Diabetic retinopathy (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      albuterol (VENTOLIN HFA) 108 (90 Base) MCG/ACT inhaler Inhale 2 puffs into the lungs every 4 (four) hours as needed for wheezing or shortness of breath. 18 g 2   aspirin 81 MG chewable tablet Chew 81 mg by mouth daily.     baclofen (LIORESAL) 10 MG tablet Take 10 mg by mouth 2 (two) times daily.      calcium carbonate (OSCAL) 1500 (600 Ca) MG TABS tablet Take 600 mg of elemental calcium by mouth daily.     Cholecalciferol (VITAMIN D3) 50 MCG (2000 UT) TABS Take 2,000 Units by mouth daily.     famotidine (PEPCID) 20 MG tablet Take 1 tablet (20 mg total) by mouth 2 (two) times daily. 60 tablet 1   insulin aspart (NOVOLOG) 100 UNIT/ML injection Inject 40 Units into the skin 3 (three) times daily before meals.     insulin glargine (LANTUS) 100 UNIT/ML injection Inject 40 Units into the skin 2 (two) times daily.      metoprolol tartrate (LOPRESSOR) 25 MG tablet Take 25 mg by mouth 2 (two) times daily.     Multiple Vitamin (MULTIVITAMIN WITH MINERALS) TABS tablet Take 1 tablet by mouth daily.     nitroGLYCERIN (NITROSTAT) 0.4 MG SL tablet Place  1 tablet (0.4 mg total) under the tongue every 5 (five) minutes x 3 doses as needed for chest pain. 25 tablet 11   rosuvastatin (CRESTOR) 10 MG tablet Take 1 tablet (10 mg total) by mouth daily at 6 PM. 90 tablet 0   sildenafil (VIAGRA) 100 MG tablet Take 100 mg by mouth daily as needed for erectile dysfunction.     ticagrelor (BRILINTA) 90 MG TABS tablet Take 1 tablet (90 mg total) by mouth 2 (two) times daily. 60 tablet 11   Insulin Glargine (BASAGLAR KWIKPEN) 100 UNIT/ML Inject 0.15 mLs (15 Units total) into the skin daily.  (Patient not taking: Reported on 02/18/2020) 15 mL 0   [DISCONTINUED] levofloxacin (LEVAQUIN) 750 MG tablet Take 1 tablet (750 mg total) by mouth daily. For 14 days 14 tablet 0   No current facility-administered medications on file prior to visit.    Allergies  Allergen Reactions   Codeine Other (See Comments)    Heart attack.    Review of Systems No fevers, chills, nausea, muscle aches, no difficulty breathing, no calf pain, no chest pain or shortness of breath.   Physical Exam  GENERAL APPEARANCE: Alert, conversant. Appropriately groomed. No acute distress.   VASCULAR: Pedal pulses faintly palpable DP and  Non palpable PT left.  Capillary refill time is immediate to all digits left foot,  Proximal to distal cooling it warm to warm left foot.[name]   NEUROLOGIC: sensation is decreased protectively to 5.07 monofilament at 1/5 sites left.  Light touch is intact  vibratory sensation intact left.   MUSCULOSKELETAL: acceptable muscle strength, tone and stability bilateral.  BK amputation on the right. Left foot has a flexible hallux that remains dorsiflexed in the resting position.   DERMATOLOGIC:  LEFT:  skin is warm, supple, and dry.  No open lesions noted.  No rash, no pre ulcerative lesions. Digital nails are thick, discolored, brittle and dystrophic 1-5 left.       Assessment     ICD-10-CM   1. Callus  L84   2. History of amputation  Z89.9   3. Nail dystrophy  L60.3      Plan    careful debridement of toenails 1-5 on the left foot was accomplished with a nail nipper and power burr without iatrogenic incident.  Paring of the callus with a #15 blade was also accomplished without complication.  Adam Ray will be seen back in 3 months for continued preventive at-risk foot care.

## 2020-02-23 ENCOUNTER — Encounter (HOSPITAL_COMMUNITY)
Admission: RE | Disposition: A | Discharge status: Home / Self Care | Payer: Self-pay | Source: Home / Self Care | Attending: Orthopedic Surgery

## 2020-02-23 ENCOUNTER — Ambulatory Visit (HOSPITAL_COMMUNITY): Payer: No Typology Code available for payment source | Admitting: Anesthesiology

## 2020-02-23 ENCOUNTER — Encounter (HOSPITAL_COMMUNITY): Payer: Self-pay | Admitting: Orthopedic Surgery

## 2020-02-23 ENCOUNTER — Ambulatory Visit (HOSPITAL_COMMUNITY)
Admission: RE | Admit: 2020-02-23 | Discharge: 2020-02-23 | Disposition: A | Discharge status: Home / Self Care | Payer: No Typology Code available for payment source | Attending: Orthopedic Surgery | Admitting: Orthopedic Surgery

## 2020-02-23 ENCOUNTER — Other Ambulatory Visit: Payer: Self-pay

## 2020-02-23 ENCOUNTER — Ambulatory Visit: Payer: No Typology Code available for payment source | Admitting: Physician Assistant

## 2020-02-23 DIAGNOSIS — Z955 Presence of coronary angioplasty implant and graft: Secondary | ICD-10-CM | POA: Diagnosis not present

## 2020-02-23 DIAGNOSIS — Y835 Amputation of limb(s) as the cause of abnormal reaction of the patient, or of later complication, without mention of misadventure at the time of the procedure: Secondary | ICD-10-CM | POA: Diagnosis not present

## 2020-02-23 DIAGNOSIS — T8781 Dehiscence of amputation stump: Secondary | ICD-10-CM | POA: Insufficient documentation

## 2020-02-23 DIAGNOSIS — I130 Hypertensive heart and chronic kidney disease with heart failure and stage 1 through stage 4 chronic kidney disease, or unspecified chronic kidney disease: Secondary | ICD-10-CM | POA: Insufficient documentation

## 2020-02-23 DIAGNOSIS — G4730 Sleep apnea, unspecified: Secondary | ICD-10-CM | POA: Insufficient documentation

## 2020-02-23 DIAGNOSIS — I509 Heart failure, unspecified: Secondary | ICD-10-CM | POA: Diagnosis not present

## 2020-02-23 DIAGNOSIS — N189 Chronic kidney disease, unspecified: Secondary | ICD-10-CM | POA: Insufficient documentation

## 2020-02-23 DIAGNOSIS — E1151 Type 2 diabetes mellitus with diabetic peripheral angiopathy without gangrene: Secondary | ICD-10-CM | POA: Diagnosis not present

## 2020-02-23 DIAGNOSIS — Z89511 Acquired absence of right leg below knee: Secondary | ICD-10-CM | POA: Diagnosis not present

## 2020-02-23 DIAGNOSIS — I252 Old myocardial infarction: Secondary | ICD-10-CM | POA: Diagnosis not present

## 2020-02-23 DIAGNOSIS — J449 Chronic obstructive pulmonary disease, unspecified: Secondary | ICD-10-CM | POA: Insufficient documentation

## 2020-02-23 DIAGNOSIS — I2510 Atherosclerotic heart disease of native coronary artery without angina pectoris: Secondary | ICD-10-CM | POA: Diagnosis not present

## 2020-02-23 DIAGNOSIS — Z89421 Acquired absence of other right toe(s): Secondary | ICD-10-CM | POA: Diagnosis not present

## 2020-02-23 DIAGNOSIS — Z87891 Personal history of nicotine dependence: Secondary | ICD-10-CM | POA: Diagnosis not present

## 2020-02-23 DIAGNOSIS — E1122 Type 2 diabetes mellitus with diabetic chronic kidney disease: Secondary | ICD-10-CM | POA: Insufficient documentation

## 2020-02-23 HISTORY — PX: AMPUTATION: SHX166

## 2020-02-23 HISTORY — DX: Acute myocardial infarction, unspecified: I21.9

## 2020-02-23 HISTORY — DX: Heart failure, unspecified: I50.9

## 2020-02-23 LAB — COMPREHENSIVE METABOLIC PANEL
ALT: 29 U/L (ref 0–44)
AST: 25 U/L (ref 15–41)
Albumin: 3.7 g/dL (ref 3.5–5.0)
Alkaline Phosphatase: 94 U/L (ref 38–126)
Anion gap: 10 (ref 5–15)
BUN: 14 mg/dL (ref 8–23)
CO2: 25 mmol/L (ref 22–32)
Calcium: 9.1 mg/dL (ref 8.9–10.3)
Chloride: 99 mmol/L (ref 98–111)
Creatinine, Ser: 1.11 mg/dL (ref 0.61–1.24)
GFR calc Af Amer: >60 mL/min (ref >60)
GFR calc non Af Amer: >60 mL/min (ref >60)
Glucose, Bld: 315 mg/dL — ABNORMAL HIGH (ref 70–99)
Potassium: 4.5 mmol/L (ref 3.5–5.1)
Sodium: 134 mmol/L — ABNORMAL LOW (ref 135–145)
Total Bilirubin: 0.5 mg/dL (ref 0.3–1.2)
Total Protein: 7.4 g/dL (ref 6.5–8.1)

## 2020-02-23 LAB — GLUCOSE, CAPILLARY
Glucose-Capillary: 325 mg/dL — ABNORMAL HIGH (ref 70–99)
Glucose-Capillary: 260 mg/dL — ABNORMAL HIGH (ref 70–99)
Glucose-Capillary: 202 mg/dL — ABNORMAL HIGH (ref 70–99)

## 2020-02-23 LAB — CBC
HCT: 35.9 % — ABNORMAL LOW (ref 39.0–52.0)
Hemoglobin: 11.6 g/dL — ABNORMAL LOW (ref 13.0–17.0)
MCH: 29.2 pg (ref 26.0–34.0)
MCHC: 32.3 g/dL (ref 30.0–36.0)
MCV: 90.4 fL (ref 80.0–100.0)
Platelets: 178 10*3/uL (ref 150–400)
RBC: 3.97 MIL/uL — ABNORMAL LOW (ref 4.22–5.81)
RDW: 13.1 % (ref 11.5–15.5)
WBC: 3.9 10*3/uL — ABNORMAL LOW (ref 4.0–10.5)
nRBC: 0 % (ref 0.0–0.2)

## 2020-02-23 SURGERY — AMPUTATION BELOW KNEE
Anesthesia: General | Site: Knee | Laterality: Right

## 2020-02-23 MED ORDER — ONDANSETRON HCL 4 MG/2ML IJ SOLN
4.0000 mg | Freq: Once | INTRAMUSCULAR | Status: AC | PRN
  Administered 2020-02-23: 4 mg via INTRAVENOUS

## 2020-02-23 MED ORDER — CHLORHEXIDINE GLUCONATE 0.12 % MT SOLN
OROMUCOSAL | Status: AC
  Administered 2020-02-23: 15 mL
  Filled 2020-02-23: qty 15

## 2020-02-23 MED ORDER — FENTANYL CITRATE (PF) 100 MCG/2ML IJ SOLN
25.0000 ug | INTRAMUSCULAR | Status: DC | PRN
  Administered 2020-02-23 (×2): 25 ug via INTRAVENOUS

## 2020-02-23 MED ORDER — LACTATED RINGERS IV SOLN: INTRAVENOUS | Status: DC

## 2020-02-23 MED ORDER — OXYCODONE HCL 5 MG PO TABS
ORAL_TABLET | ORAL | Status: AC
  Administered 2020-02-23: 5 mg via ORAL
  Filled 2020-02-23: qty 1

## 2020-02-23 MED ORDER — LIDOCAINE HCL (CARDIAC) PF 100 MG/5ML IV SOSY
PREFILLED_SYRINGE | INTRAVENOUS | Status: DC | PRN
  Administered 2020-02-23: 60 mg via INTRATRACHEAL

## 2020-02-23 MED ORDER — ONDANSETRON HCL 4 MG/2ML IJ SOLN
INTRAMUSCULAR | Status: AC
  Filled 2020-02-23: qty 2

## 2020-02-23 MED ORDER — CHLORHEXIDINE GLUCONATE 0.12 % MT SOLN: 15.0000 mL | Freq: Once | OROMUCOSAL | Status: DC

## 2020-02-23 MED ORDER — INSULIN ASPART 100 UNIT/ML ~~LOC~~ SOLN
SUBCUTANEOUS | Status: AC
  Administered 2020-02-23: 10 [IU] via INTRAVENOUS
  Filled 2020-02-23: qty 1

## 2020-02-23 MED ORDER — FENTANYL CITRATE (PF) 250 MCG/5ML IJ SOLN
INTRAMUSCULAR | Status: DC | PRN
  Administered 2020-02-23 (×2): 50 ug via INTRAVENOUS

## 2020-02-23 MED ORDER — OXYCODONE-ACETAMINOPHEN 5-325 MG PO TABS: 1.0000 | ORAL_TABLET | ORAL | 0 refills | Status: DC | PRN

## 2020-02-23 MED ORDER — 0.9 % SODIUM CHLORIDE (POUR BTL) OPTIME
TOPICAL | Status: DC | PRN
  Administered 2020-02-23: 1000 mL

## 2020-02-23 MED ORDER — MEPERIDINE HCL 25 MG/ML IJ SOLN: 6.2500 mg | INTRAMUSCULAR | Status: DC | PRN

## 2020-02-23 MED ORDER — CEFAZOLIN SODIUM-DEXTROSE 2-4 GM/100ML-% IV SOLN: 2.0000 g | INTRAVENOUS | Status: DC

## 2020-02-23 MED ORDER — ORAL CARE MOUTH RINSE: 15.0000 mL | Freq: Once | OROMUCOSAL | Status: DC

## 2020-02-23 MED ORDER — EPHEDRINE SULFATE 50 MG/ML IJ SOLN
INTRAMUSCULAR | Status: DC | PRN
  Administered 2020-02-23: 10 mg via INTRAVENOUS
  Administered 2020-02-23: 5 mg via INTRAVENOUS

## 2020-02-23 MED ORDER — ACETAMINOPHEN 160 MG/5ML PO SOLN: 325.0000 mg | ORAL | Status: DC | PRN

## 2020-02-23 MED ORDER — PROPOFOL 10 MG/ML IV BOLUS
INTRAVENOUS | Status: DC | PRN
  Administered 2020-02-23: 200 mg via INTRAVENOUS

## 2020-02-23 MED ORDER — MIDAZOLAM HCL 2 MG/2ML IJ SOLN
INTRAMUSCULAR | Status: AC
  Filled 2020-02-23: qty 2

## 2020-02-23 MED ORDER — CEFAZOLIN SODIUM-DEXTROSE 2-4 GM/100ML-% IV SOLN
INTRAVENOUS | Status: AC
  Filled 2020-02-23: qty 100

## 2020-02-23 MED ORDER — INSULIN ASPART 100 UNIT/ML IV SOLN
10.0000 [IU] | Freq: Once | INTRAVENOUS | Status: AC
  Filled 2020-02-23: qty 0.1

## 2020-02-23 MED ORDER — OXYCODONE HCL 5 MG/5ML PO SOLN: 5.0000 mg | Freq: Once | ORAL | Status: AC | PRN

## 2020-02-23 MED ORDER — FENTANYL CITRATE (PF) 250 MCG/5ML IJ SOLN
INTRAMUSCULAR | Status: AC
  Filled 2020-02-23: qty 5

## 2020-02-23 MED ORDER — OXYCODONE HCL 5 MG PO TABS: 5.0000 mg | ORAL_TABLET | Freq: Once | ORAL | Status: AC | PRN

## 2020-02-23 MED ORDER — FENTANYL CITRATE (PF) 100 MCG/2ML IJ SOLN
INTRAMUSCULAR | Status: AC
  Administered 2020-02-23: 50 ug via INTRAVENOUS
  Filled 2020-02-23: qty 2

## 2020-02-23 MED ORDER — ACETAMINOPHEN 325 MG PO TABS: 325.0000 mg | ORAL_TABLET | ORAL | Status: DC | PRN

## 2020-02-23 MED ORDER — CEFAZOLIN SODIUM-DEXTROSE 2-4 GM/100ML-% IV SOLN
2.0000 g | INTRAVENOUS | Status: AC
  Administered 2020-02-23: 2 g via INTRAVENOUS

## 2020-02-23 SURGICAL SUPPLY — 37 items
BLADE SAW RECIP 87.9 MT (BLADE) ×2 IMPLANT
BLADE SURG 21 STRL SS (BLADE) ×2 IMPLANT
BNDG COHESIVE 6X5 TAN STRL LF (GAUZE/BANDAGES/DRESSINGS) IMPLANT
CANISTER WOUND CARE 500ML ATS (WOUND CARE) ×2 IMPLANT
COVER SURGICAL LIGHT HANDLE (MISCELLANEOUS) ×2 IMPLANT
COVER WAND RF STERILE (DRAPES) IMPLANT
CUFF TOURN SGL QUICK 34 (TOURNIQUET CUFF) ×2
CUFF TRNQT CYL 34X4.125X (TOURNIQUET CUFF) ×1 IMPLANT
DRAPE INCISE IOBAN 66X45 STRL (DRAPES) ×2 IMPLANT
DRAPE U-SHAPE 47X51 STRL (DRAPES) ×2 IMPLANT
DRESSING PREVENA PLUS CUSTOM (GAUZE/BANDAGES/DRESSINGS) ×1 IMPLANT
DRSG PREVENA PLUS CUSTOM (GAUZE/BANDAGES/DRESSINGS) ×2
DURAPREP 26ML APPLICATOR (WOUND CARE) ×2 IMPLANT
ELECT REM PT RETURN 9FT ADLT (ELECTROSURGICAL) ×2
ELECTRODE REM PT RTRN 9FT ADLT (ELECTROSURGICAL) ×1 IMPLANT
GLOVE BIOGEL PI IND STRL 9 (GLOVE) ×1 IMPLANT
GLOVE BIOGEL PI INDICATOR 9 (GLOVE) ×1
GLOVE SURG ORTHO 9.0 STRL STRW (GLOVE) ×2 IMPLANT
GOWN STRL REUS W/ TWL XL LVL3 (GOWN DISPOSABLE) ×2 IMPLANT
GOWN STRL REUS W/TWL XL LVL3 (GOWN DISPOSABLE) ×4
KIT BASIN OR (CUSTOM PROCEDURE TRAY) ×2 IMPLANT
KIT TURNOVER KIT B (KITS) ×2 IMPLANT
MANIFOLD NEPTUNE II (INSTRUMENTS) ×2 IMPLANT
NS IRRIG 1000ML POUR BTL (IV SOLUTION) ×2 IMPLANT
PACK ORTHO EXTREMITY (CUSTOM PROCEDURE TRAY) ×2 IMPLANT
PAD ARMBOARD 7.5X6 YLW CONV (MISCELLANEOUS) ×2 IMPLANT
PREVENA RESTOR ARTHOFORM 46X30 (CANNISTER) ×2 IMPLANT
SPONGE LAP 18X18 RF (DISPOSABLE) IMPLANT
STAPLER VISISTAT 35W (STAPLE) IMPLANT
STOCKINETTE IMPERVIOUS LG (DRAPES) ×2 IMPLANT
SUT ETHILON 2 0 PSLX (SUTURE) ×2 IMPLANT
SUT SILK 2 0 (SUTURE) ×2
SUT SILK 2-0 18XBRD TIE 12 (SUTURE) ×1 IMPLANT
SUT VIC AB 1 CTX 27 (SUTURE) ×4 IMPLANT
TOWEL GREEN STERILE (TOWEL DISPOSABLE) ×2 IMPLANT
TUBE CONNECTING 12X1/4 (SUCTIONS) ×2 IMPLANT
YANKAUER SUCT BULB TIP NO VENT (SUCTIONS) ×2 IMPLANT

## 2020-02-23 NOTE — Anesthesia Preprocedure Evaluation (Addendum)
Anesthesia Evaluation  Patient identified by MRN, date of birth, ID band Patient awake    Reviewed: Allergy & Precautions, H&P , NPO status , Patient's Chart, lab work & pertinent test results  Airway Mallampati: II   Neck ROM: full    Dental  (+) Edentulous Upper, Poor Dentition, Missing   Pulmonary sleep apnea , COPD, Current Smoker and Patient abstained from smoking., former smoker,    breath sounds clear to auscultation       Cardiovascular hypertension, + CAD, + Past MI and + Peripheral Vascular Disease   Rhythm:regular Rate:Normal  ECHO 20' LVEF 65-70%, normal wall thickness, normal wall motion, grade 1  DD, elevated LV filling pressure, bicuspid aortic valve is again  noted (compared to prior echo in 2011) without stenosis or  regurgitation, mild MR, normal LA size, dilated IVC.    Neuro/Psych PSYCHIATRIC DISORDERS Anxiety Depression  Neuromuscular disease    GI/Hepatic   Endo/Other  diabetes, Type 2  Renal/GU Renal Insufficiency and ARFRenal disease     Musculoskeletal  (+) Arthritis ,   Abdominal   Peds  Hematology   Anesthesia Other Findings   Reproductive/Obstetrics                            Anesthesia Physical  Anesthesia Plan  ASA: III  Anesthesia Plan: General   Post-op Pain Management:    Induction: Intravenous  PONV Risk Score and Plan: 1 and Ondansetron, Treatment may vary due to age or medical condition and Midazolam  Airway Management Planned: LMA  Additional Equipment:   Intra-op Plan:   Post-operative Plan: Extubation in OR  Informed Consent: I have reviewed the patients History and Physical, chart, labs and discussed the procedure including the risks, benefits and alternatives for the proposed anesthesia with the patient or authorized representative who has indicated his/her understanding and acceptance.       Plan Discussed with: CRNA,  Anesthesiologist and Surgeon  Anesthesia Plan Comments:         Anesthesia Quick Evaluation

## 2020-02-23 NOTE — Op Note (Signed)
02/23/2020  4:00 PM  PATIENT:  Adam Ray    PRE-OPERATIVE DIAGNOSIS:  below knee amputation wound dehiscence  POST-OPERATIVE DIAGNOSIS:  Same  PROCEDURE:  RIGHT BELOW KNEE AMPUTATION REVISION Application of Prevena customizable and Arthur form wound VAC  SURGEON:  Newt Minion, MD  PHYSICIAN ASSISTANT:None ANESTHESIA:   General  PREOPERATIVE INDICATIONS:  Adam Ray is a  64 y.o. male with a diagnosis of below knee amputation wound dehiscence who failed conservative measures and elected for surgical management.    The risks benefits and alternatives were discussed with the patient preoperatively including but not limited to the risks of infection, bleeding, nerve injury, cardiopulmonary complications, the need for revision surgery, among others, and the patient was willing to proceed.  OPERATIVE IMPLANTS: Praveena customizable and Arthur form wound VAC  @ENCIMAGES @  OPERATIVE FINDINGS: Good petechial bleeding no deep abscess no necrotic muscle.  OPERATIVE PROCEDURE: Patient was brought to the operating room underwent a general anesthetic.  After adequate levels anesthesia were obtained patient's right lower extremity was prepped using DuraPrep draped into a sterile field a timeout was called.  A fishmouth incision was made around the area of stump dehiscence and necrotic tissue this was carried through healthy tissue down to bone.  The distal centimeter of the tibia and fibula were resected with a reciprocating saw the tibia was beveled anteriorly.  Electrocautery was used for hemostasis.  The deep and superficial fascia layers and skin was closed using 2-0 nylon this was covered with a Praveena customizable and Praveena Arthur form wound VAC.  This had a good suction fit patient was extubated taken the PACU in stable condition.   DISCHARGE PLANNING:  Antibiotic duration: Preoperative antibiotics   Weightbearing: Nonweightbearing on the right  Pain medication: Prescription  for Percocet  Dressing care/ Wound VAC: Discharge with wound VAC  Ambulatory devices: Walker or crutches  Discharge to: Home.  Follow-up: In the office 1 week post operative.

## 2020-02-23 NOTE — H&P (Signed)
Adam Ray is an 64 y.o. male.   Chief Complaint: Right Below Knee Wound Dehiscence HPI: The patient is 8 weeks s/p  Right Below Knee Amputation . He has been slow to heal and has continued medial wound dehiscence  Past Medical History:  Diagnosis Date  . Allergy   . Arthritis   . Bronchitis   . Cataract   . CHF (congestive heart failure) ([place])   . Chronic kidney disease   . Claudication [facility])    right foot ray resection  . Colon polyps    hyperplastic  . COPD (chronic obstructive pulmonary disease) ([place])   . Coronary artery disease   . Diabetes mellitus    type II  . Genital warts   . Gout   . Hyperlipidemia   . Hypertension   . Myocardial infarction ([place])   . Osteomyelitis of third toe of right foot ([place])   . Pneumonia   . Status post amputation of toe of right foot ([place]) 09/24/2016    Past Surgical History:  Procedure Laterality Date  . AMPUTATION Right 01/31/2016   Procedure: Right 2nd Toe Amputation;  Surgeon: Newt Minion, MD;  Location: [place];  Service: Orthopedics;  Laterality: Right;  . AMPUTATION Right 07/08/2018   Procedure: RIGHT TRANSMETATARSAL AMPUTATION;  Surgeon: Newt Minion, MD;  Location: [place];  Service: Orthopedics;  Laterality: Right;  . AMPUTATION Right 01/05/2020   Procedure: RIGHT BELOW KNEE AMPUTATION;  Surgeon: Newt Minion, MD;  Location: [place];  Service: Orthopedics;  Laterality: Right;  . CATARACT EXTRACTION     right eye  . COLONOSCOPY    . CORONARY STENT INTERVENTION N/A 07/12/2018   Procedure: CORONARY STENT INTERVENTION;  Surgeon: Troy Sine, MD;  Location: [place] CV [facility];  Service: Cardiovascular;  Laterality: N/A;  . CORONARY/GRAFT ACUTE MI REVASCULARIZATION N/A 07/12/2018   Procedure: Coronary/Graft Acute MI Revascularization;  Surgeon: Troy Sine, MD;  Location: [place] [facility];  Service: Cardiovascular;  Laterality: N/A;  . I & D EXTREMITY  04/11/2012   Procedure: IRRIGATION AND DEBRIDEMENT EXTREMITY;  Surgeon: Wylene Simmer, MD;  Location: [place];  Service: Orthopedics;  Laterality: Right;  . LEFT HEART CATH AND CORONARY ANGIOGRAPHY N/A 07/12/2018   Procedure: LEFT HEART CATH AND CORONARY ANGIOGRAPHY;  Surgeon: Troy Sine, MD;  Location: [place] [facility];  Service: Cardiovascular;  Laterality: N/A;  . Surgery left great toe    . Tear ducts bilateral eyes    . TRANSMETATARSAL AMPUTATION Right 07/08/2018    Family History  Problem Relation Age of Onset  . Diabetes Mother   . Stroke Mother   . Heart failure Father   . Colon cancer Neg Hx   . Esophageal cancer Neg Hx   . Rectal cancer Neg Hx   . Stomach cancer Neg Hx    Social History:  reports that he quit smoking about 8 months ago. His smoking use included cigars and cigarettes. He started smoking about 56 years ago. He has a 14.40 pack-year smoking history. He has quit using smokeless tobacco. He reports previous alcohol use. He reports that he does not use drugs.  Allergies:  Allergies  Allergen Reactions  . Codeine Other (See Comments)    Heart attack.    No medications prior to admission.    Results for orders placed or performed during the hospital encounter of 02/22/20 (from the past 48 hour(s))  SARS CORONAVIRUS 2 (TAT 6-24 HRS) Nasopharyngeal Nasopharyngeal Swab  Status: None   Collection Time: 02/22/20  1:33 PM   Specimen: Nasopharyngeal Swab  Result Value Ref Range   SARS Coronavirus 2 NEGATIVE NEGATIVE    Comment: (NOTE) SARS-CoV-2 target nucleic acids are NOT DETECTED.  The SARS-CoV-2 RNA is generally detectable in upper and lower respiratory specimens during the acute phase of infection. Negative results do not preclude SARS-CoV-2 infection, do not rule out co-infections with other pathogens, and should not be used as the sole basis for treatment or other patient management decisions. Negative results must be combined with clinical observations, patient history, and epidemiological information. The expected result  is Negative.  Fact Sheet for Patients: SugarRoll.be  Fact Sheet for Healthcare Providers: https://www.woods-mathews.com/  This test is not yet approved or cleared by the [place] FDA and  has been authorized for detection and/or diagnosis of SARS-CoV-2 by FDA under an Emergency Use Authorization (EUA). This EUA will remain  in effect (meaning this test can be used) for the duration of the COVID-19 declaration under Se ction 564(b)(1) of the Act, 21 U.S.C. section 360bbb-3(b)(1), unless the authorization is terminated or revoked sooner.  Performed at [facility], [place] [address], [place], [place] [address]    No results found.  Review of Systems  All other systems reviewed and are negative.   There were no vitals taken for this visit. Physical Exam  Patient is alert, oriented, no adenopathy, well-dressed, normal affect, normal respiratory effort. Right below-knee amputation stump wound dehiscence on the medial side with exposed subcutaneous fat.  Wound dehiscence is approximately 6 to 7 cm x 4 cm.  No surrounding cellulitis.  There is necrotic skin  Lungs clear heart regular rate and rhythm Assessment/Plan Plan: Plan for Right Below Knee Amputation revision. Discussed Recovery.  This could be done as an outpatient next week  [place], PA 02/23/2020, 6:50 AM

## 2020-02-23 NOTE — Anesthesia Postprocedure Evaluation (Signed)
Anesthesia Post Note  Patient: Adam Ray  Procedure(s) Performed: RIGHT BELOW KNEE AMPUTATION REVISION (Right Knee)     Patient location during evaluation: PACU Anesthesia Type: General Level of consciousness: awake and alert, oriented and patient cooperative Pain management: pain level controlled Vital Signs Assessment: post-procedure vital signs reviewed and stable Respiratory status: spontaneous breathing, nonlabored ventilation and respiratory function stable Cardiovascular status: blood pressure returned to baseline and stable Postop Assessment: no apparent nausea or vomiting Anesthetic complications: no   No complications documented.  Last Vitals:  Vitals:   02/23/20 1615 02/23/20 1620  BP: 116/75   Pulse: 95 76  Resp: (!) 22 12  Temp:    SpO2: 97% 97%    Last Pain:  Vitals:   02/23/20 1620  TempSrc:   PainSc: 10-Worst pain ever                 Pervis Hocking

## 2020-02-23 NOTE — Anesthesia Procedure Notes (Signed)
Procedure Name: LMA Insertion °Performed by: Ariani Seier H, CRNA °Pre-anesthesia Checklist: Patient identified, Emergency Drugs available, Suction available and Patient being monitored °Patient Re-evaluated:Patient Re-evaluated prior to induction °Oxygen Delivery Method: Circle System Utilized °Preoxygenation: Pre-oxygenation with 100% oxygen °Induction Type: IV induction °Ventilation: Mask ventilation without difficulty °LMA: LMA inserted °LMA Size: 4.0 °Number of attempts: 1 °Airway Equipment and Method: Bite block °Placement Confirmation: positive ETCO2 °Tube secured with: Tape °Dental Injury: Teeth and Oropharynx as per pre-operative assessment  ° ° ° ° ° ° °

## 2020-02-23 NOTE — Transfer of Care (Signed)
Immediate Anesthesia Transfer of Care Note  Patient: PRINCE COUEY  Procedure(s) Performed: RIGHT BELOW KNEE AMPUTATION REVISION (Right Knee)  Patient Location: PACU  Anesthesia Type:General  Level of Consciousness: drowsy  Airway & Oxygen Therapy: Patient Spontanous Breathing and Patient connected to face mask oxygen  Post-op Assessment: Report given to RN and Post -op Vital signs reviewed and stable  Post vital signs: Reviewed and stable  Last Vitals:  Vitals Value Taken Time  BP 130/85 02/23/20 1558  Temp    Pulse 96 02/23/20 1600  Resp 17 02/23/20 1600  SpO2 100 % 02/23/20 1600  Vitals shown include unvalidated device data.  Last Pain:  Vitals:   02/23/20 1325  TempSrc:   PainSc: 0-No pain      Patients Stated Pain Goal: 3 (76/70/11 0034)  Complications: No complications documented.

## 2020-02-24 ENCOUNTER — Encounter (HOSPITAL_COMMUNITY): Payer: Self-pay | Admitting: Orthopedic Surgery

## 2020-02-24 ENCOUNTER — Telehealth: Payer: Self-pay | Admitting: Orthopedic Surgery

## 2020-02-24 NOTE — Telephone Encounter (Signed)
Wife called.   She is requesting that his home health aide be dismissed. Feels they no longer need it.   Call back: [phone]

## 2020-02-24 NOTE — Telephone Encounter (Signed)
Adam Ray and his wife were on this phone call.  He is post op and would like his home health visits cancelled.  His wife states that she can do anything he needs done, he does not need nurses to come to his house. "He is doing fine", "please cancel their services".  Advised that I'm sure they are just checking [name]'s dressing and making sure he is doing ok.  His wife insists that she does not want a bunch of people in her house especially now with the virus that is going around.

## 2020-02-24 NOTE — Telephone Encounter (Signed)
Called and sw pt's wife to advise that I would notify [facility] that they do not want the service. Adam Ray is who was providing care. I notified [name] and the pt and his wife will contact me if they have any other questions.

## 2020-02-24 NOTE — Telephone Encounter (Signed)
Duplicate message. This has been done.

## 2020-02-25 ENCOUNTER — Ambulatory Visit: Payer: No Typology Code available for payment source | Admitting: Surgical

## 2020-02-25 ENCOUNTER — Encounter: Payer: Self-pay | Admitting: Family

## 2020-02-25 ENCOUNTER — Ambulatory Visit (INDEPENDENT_AMBULATORY_CARE_PROVIDER_SITE_OTHER): Payer: No Typology Code available for payment source | Admitting: Family

## 2020-02-25 VITALS — Ht 70.0 in | Wt 170.0 lb

## 2020-02-25 DIAGNOSIS — Z89511 Acquired absence of right leg below knee: Secondary | ICD-10-CM

## 2020-03-01 ENCOUNTER — Encounter: Payer: Self-pay | Admitting: Family

## 2020-03-01 ENCOUNTER — Inpatient Hospital Stay: Payer: No Typology Code available for payment source | Admitting: Physician Assistant

## 2020-03-01 NOTE — Progress Notes (Signed)
Post-Op Visit Note   Patient: Adam Ray           Date of Birth: 21-Aug-1955           MRN: [id number] Visit Date: 02/25/2020 PCP: [name], MD  Chief Complaint:  Chief Complaint  Patient presents with  . Right Leg - Routine Post Op    02/23/20 right BKA     HPI:  HPI Patient is a 64 year old gentleman seen today for some concerns with his wound VAC states that this was alarming at home.  He is status post revision of his low the knee amputation on the right just 2 days ago.  The wound VAC appears to be working well.  Ortho Exam On examination of the right residual limb the wound VAC is in place there is a good suction currently. There are some areas to his anterior thigh where the dressing has some air underneath.  No drainage outside the dressing.  Visit Diagnoses:  1. Acquired absence of right leg below knee (HCC)     Plan: Reinforced the wound VAC dressing to the anterior thigh continues with good seal to fit and suction.  We will follow-up in the office as scheduled.  They are aware of any return precautions.  Follow-Up Instructions: Return in about 1 week (around 03/03/2020), or as scheduled.   Imaging: No results found.  Orders:  No orders of the defined types were placed in this encounter.  No orders of the defined types were placed in this encounter.    PMFS History: Patient Active Problem List   Diagnosis Date Noted  . Dehiscence of amputation stump ([place])   . Acquired absence of right leg below knee ([place]) 02/09/2020  . Coronary artery disease 01/18/2020  . AKI (acute kidney injury) ([place]) 01/17/2020  . Nocturnal leg cramps 10/28/2019  . Hyperlipidemia 05/26/2019  . Edema leg 12/29/2018  . STEMI involving left circumflex coronary artery ([place]) 07/12/2018  . Status post transmetatarsal amputation of foot, right ([place]) 07/08/2018  . Limited mobility 05/12/2018  . Gingival foreign body 07/22/2017  . Anxiety state 06/02/2017  . Leg swelling  01/29/2017  . Idiopathic chronic venous hypertension of both lower extremities with inflammation 09/24/2016  . Testicular mass 04/18/2016  . Subacute osteomyelitis, right ankle and foot ([place]) 01/29/2016  . Diabetic polyneuropathy associated with type 2 diabetes mellitus ([place])   . Type 2 diabetes mellitus with neurologic complication, with long-term current use of insulin ([place]) 12/08/2015  . Pulmonary nodule 02/01/2015  . Depression 12/09/2013  . Warts, genital 08/20/2013  . 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 Diabetic retinopathy ([place]) 01/28/2013  . Sleep apnea 09/06/2010  . 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 Essential hypertension 11/09/2008  . COPD, mild ([place]) 10/06/2006  . SICKLE-CELL TRAIT 04/26/2006  . ERECTILE DYSFUNCTION 04/26/2006  . Tobacco abuse 04/26/2006  . PAD (peripheral artery disease) ([place]) 04/26/2006  . ALLERGIC RHINITIS 04/26/2006   Past Medical History:  Diagnosis Date  . Allergy   . Arthritis   . Bronchitis   . Cataract   . CHF (congestive heart failure) ([place])   . Chronic kidney disease   . Claudication [facility])    right foot ray resection  . Colon polyps    hyperplastic  . COPD (chronic obstructive pulmonary disease) ([place])   . Coronary artery disease   . Diabetes mellitus    type II  . Genital warts   . Gout   . Hyperlipidemia   . Hypertension   .  Myocardial infarction ([place])   . Osteomyelitis of third toe of right foot ([place])   . Pneumonia   . Status post amputation of toe of right foot ([place]) 09/24/2016    Family History  Problem Relation Age of Onset  . Diabetes Mother   . Stroke Mother   . Heart failure Father   . Colon cancer Neg Hx   . Esophageal cancer Neg Hx   . Rectal cancer Neg Hx   . Stomach cancer Neg Hx     Past Surgical History:  Procedure Laterality Date  . AMPUTATION Right 01/31/2016   Procedure: Right 2nd Toe Amputation;  Surgeon: Newt Minion, MD;  Location: [place];  Service: Orthopedics;   Laterality: Right;  . AMPUTATION Right 07/08/2018   Procedure: RIGHT TRANSMETATARSAL AMPUTATION;  Surgeon: Newt Minion, MD;  Location: [place];  Service: Orthopedics;  Laterality: Right;  . AMPUTATION Right 01/05/2020   Procedure: RIGHT BELOW KNEE AMPUTATION;  Surgeon: Newt Minion, MD;  Location: [place];  Service: Orthopedics;  Laterality: Right;  . AMPUTATION Right 02/23/2020   Procedure: RIGHT BELOW KNEE AMPUTATION REVISION;  Surgeon: Newt Minion, MD;  Location: [place];  Service: Orthopedics;  Laterality: Right;  . CATARACT EXTRACTION     right eye  . COLONOSCOPY    . CORONARY STENT INTERVENTION N/A 07/12/2018   Procedure: CORONARY STENT INTERVENTION;  Surgeon: Troy Sine, MD;  Location: [place] [facility];  Service: Cardiovascular;  Laterality: N/A;  . CORONARY/GRAFT ACUTE MI REVASCULARIZATION N/A 07/12/2018   Procedure: Coronary/Graft Acute MI Revascularization;  Surgeon: Troy Sine, MD;  Location: [place] [facility];  Service: Cardiovascular;  Laterality: N/A;  . I & D EXTREMITY  04/11/2012   Procedure: IRRIGATION AND DEBRIDEMENT EXTREMITY;  Surgeon: Wylene Simmer, MD;  Location: [place];  Service: Orthopedics;  Laterality: Right;  . LEFT HEART CATH AND CORONARY ANGIOGRAPHY N/A 07/12/2018   Procedure: LEFT HEART CATH AND CORONARY ANGIOGRAPHY;  Surgeon: Troy Sine, MD;  Location: [place] [facility];  Service: Cardiovascular;  Laterality: N/A;  . Surgery left great toe    . Tear ducts bilateral eyes    . TRANSMETATARSAL AMPUTATION Right 07/08/2018   Social History   Occupational History  . Occupation: disabled  Tobacco Use  . Smoking status: Former Smoker    Packs/day: 0.30    Years: 48.00    Pack years: 14.40    Types: Cigars, Cigarettes    Start date: 07/02/1963    Quit date: 06/01/2019    Years since quitting: 0.7  . Smokeless tobacco: Former Network engineer  . Vaping Use: Former  Substance and Sexual Activity  . Alcohol use: Not Currently    Alcohol/week: 0.0  standard drinks  . Drug use: No  . Sexual activity: Not on file

## 2020-03-03 ENCOUNTER — Ambulatory Visit (INDEPENDENT_AMBULATORY_CARE_PROVIDER_SITE_OTHER): Payer: No Typology Code available for payment source | Admitting: Physician Assistant

## 2020-03-03 ENCOUNTER — Encounter: Payer: Self-pay | Admitting: Physician Assistant

## 2020-03-03 DIAGNOSIS — Z89511 Acquired absence of right leg below knee: Secondary | ICD-10-CM

## 2020-03-03 NOTE — Progress Notes (Signed)
Office Visit Note   Patient: Adam Ray           Date of Birth: 11/26/1955           MRN: [id number] Visit Date: 03/03/2020              Requested by: [name], [address] [place],  [place] [address] PCP: [name], MD  Chief Complaint  Patient presents with  . Right Leg - Routine Post Op    02/23/20 right BKA revision       HPI: Patient is 1 week status post right below-knee amputation revision.  He is overall doing well.  He is also concerned that he seems to have more swelling in his left leg.  Denies any calf pain.  Assessment & Plan: Visit Diagnoses: No diagnosis found.  Plan: We will begin doing daily cleansing to the below-knee amputation stump.  He does have shrinkers and could place a clean one there daily.  With regards to the left leg I have prescribed based on his measurements a extra-large VVIVE compression stocking follow-up in 1 week  Follow-Up Instructions: No follow-ups on file.   Ortho Exam  Patient is alert, oriented, no adenopathy, well-dressed, normal affect, normal respiratory effort. Amputation stump is healing excellently.  Surgical staples are in place no swelling no foul odor well apposed wound edges without necrosis no cellulitis  Imaging: No results found. No images are attached to the encounter.  Labs: Lab Results  Component Value Date   HGBA1C 8.7 (H) 01/19/2020   HGBA1C 7.2 (A) 10/28/2019   HGBA1C 7.6 (A) 05/31/2019   ESRSEDRATE 54 (H) 01/01/2020   ESRSEDRATE 8 01/29/2016   ESRSEDRATE 9 04/11/2012   CRP 2.6 (H) 01/29/2016   CRP 0.5 02/28/2014   REPTSTATUS 01/18/2020 FINAL 01/17/2020   REPTSTATUS 01/22/2020 FINAL 01/17/2020   REPTSTATUS 01/22/2020 FINAL 01/17/2020   GRAMSTAIN  04/11/2012    NO WBC SEEN RARE SQUAMOUS EPITHELIAL CELLS PRESENT MODERATE GRAM POSITIVE COCCI IN PAIRS RARE GRAM NEGATIVE RODS   GRAMSTAIN  04/11/2012    NO WBC SEEN RARE SQUAMOUS EPITHELIAL CELLS PRESENT MODERATE  GRAM POSITIVE COCCI IN PAIRS RARE GRAM NEGATIVE RODS   CULT (A) 01/17/2020    <10,000 COLONIES/mL INSIGNIFICANT GROWTH Performed at [facility], [address], [place], [place] [address]    CULT  01/17/2020    NO GROWTH 5 DAYS Performed at [facility] [address], [place], [place] [address]    CULT  01/17/2020    NO GROWTH 5 DAYS Performed at [facility] [address] [place], [place] [address]    LABORGA NO GROWTH 2 DAYS 07/19/2014     Lab Results  Component Value Date   ALBUMIN 3.7 02/23/2020   ALBUMIN 3.0 (L) 01/18/2020   ALBUMIN 3.4 (L) 01/17/2020    No results found for: MG No results found for: VD25OH  No results found for: PREALBUMIN CBC EXTENDED Latest Ref Rng & Units 02/23/2020 01/27/2020 01/19/2020  WBC 4.0 - 10.5 K/uL 3.9(L) 3.1(L) 3.2(L)  RBC 4.22 - 5.81 MIL/uL 3.97(L) 3.52(L) 3.42(L)  HGB 13.0 - 17.0 g/dL 11.6(L) 10.5(L) 9.9(L)  HCT 39 - 52 % 35.9(L) 31.2(L) 30.4(L)  PLT 150 - 400 K/uL 178 132(L) 167  NEUTROABS 1.7 - 7.7 K/uL - - -  LYMPHSABS 0.7 - 4.0 K/uL - - -     There is no height or weight on file to calculate BMI.  Orders:  No orders of the defined types were placed in this encounter.  No orders of the defined types were placed in this encounter.    Procedures: No procedures performed  Clinical Data: No additional findings.  ROS:  All other systems negative, except as noted in the HPI. Review of Systems  Objective: Vital Signs: There were no vitals taken for this visit.  Specialty Comments:  No specialty comments available.  PMFS History: Patient Active Problem List   Diagnosis Date Noted  . Dehiscence of amputation stump ([place])   . Acquired absence of right leg below knee ([place]) 02/09/2020  . Coronary artery disease 01/18/2020  . AKI (acute kidney injury) ([place]) 01/17/2020  . Nocturnal leg cramps 10/28/2019  . Hyperlipidemia 05/26/2019  . Edema leg 12/29/2018  . STEMI involving left circumflex  coronary artery ([place]) 07/12/2018  . Status post transmetatarsal amputation of foot, right ([place]) 07/08/2018  . Limited mobility 05/12/2018  . Gingival foreign body 07/22/2017  . Anxiety state 06/02/2017  . Leg swelling 01/29/2017  . Idiopathic chronic venous hypertension of both lower extremities with inflammation 09/24/2016  . Testicular mass 04/18/2016  . Subacute osteomyelitis, right ankle and foot ([place]) 01/29/2016  . Diabetic polyneuropathy associated with type 2 diabetes mellitus ([place])   . Type 2 diabetes mellitus with neurologic complication, with long-term current use of insulin ([place]) 12/08/2015  . Pulmonary nodule 02/01/2015  . Depression 12/09/2013  . Warts, genital 08/20/2013  . 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 Diabetic retinopathy ([place]) 01/28/2013  . Sleep apnea 09/06/2010  . 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 Essential hypertension 11/09/2008  . COPD, mild ([place]) 10/06/2006  . SICKLE-CELL TRAIT 04/26/2006  . ERECTILE DYSFUNCTION 04/26/2006  . Tobacco abuse 04/26/2006  . PAD (peripheral artery disease) ([place]) 04/26/2006  . ALLERGIC RHINITIS 04/26/2006   Past Medical History:  Diagnosis Date  . Allergy   . Arthritis   . Bronchitis   . Cataract   . CHF (congestive heart failure) ([place])   . Chronic kidney disease   . Claudication [facility])    right foot ray resection  . Colon polyps    hyperplastic  . COPD (chronic obstructive pulmonary disease) ([place])   . Coronary artery disease   . Diabetes mellitus    type II  . Genital warts   . Gout   . Hyperlipidemia   . Hypertension   . Myocardial infarction ([place])   . Osteomyelitis of third toe of right foot ([place])   . Pneumonia   . Status post amputation of toe of right foot ([place]) 09/24/2016    Family History  Problem Relation Age of Onset  . Diabetes Mother   . Stroke Mother   . Heart failure Father   . Colon cancer Neg Hx   . Esophageal cancer Neg Hx   . Rectal cancer Neg Hx   .  Stomach cancer Neg Hx     Past Surgical History:  Procedure Laterality Date  . AMPUTATION Right 01/31/2016   Procedure: Right 2nd Toe Amputation;  Surgeon: Newt Minion, MD;  Location: [facility];  Service: Orthopedics;  Laterality: Right;  . AMPUTATION Right 07/08/2018   Procedure: RIGHT TRANSMETATARSAL AMPUTATION;  Surgeon: Newt Minion, MD;  Location: [facility];  Service: Orthopedics;  Laterality: Right;  . AMPUTATION Right 01/05/2020   Procedure: RIGHT BELOW KNEE AMPUTATION;  Surgeon: Newt Minion, MD;  Location: [facility];  Service: Orthopedics;  Laterality: Right;  . AMPUTATION Right 02/23/2020   Procedure: RIGHT  BELOW KNEE AMPUTATION REVISION;  Surgeon: Newt Minion, MD;  Location: [facility];  Service: Orthopedics;  Laterality: Right;  . CATARACT EXTRACTION     right eye  . COLONOSCOPY    . CORONARY STENT INTERVENTION N/A 07/12/2018   Procedure: CORONARY STENT INTERVENTION;  Surgeon: Troy Sine, MD;  Location: [facility];  Service: Cardiovascular;  Laterality: N/A;  . CORONARY/GRAFT ACUTE MI REVASCULARIZATION N/A 07/12/2018   Procedure: Coronary/Graft Acute MI Revascularization;  Surgeon: Troy Sine, MD;  Location: [facility];  Service: Cardiovascular;  Laterality: N/A;  . I & D EXTREMITY  04/11/2012   Procedure: IRRIGATION AND DEBRIDEMENT EXTREMITY;  Surgeon: Wylene Simmer, MD;  Location: [facility];  Service: Orthopedics;  Laterality: Right;  . LEFT HEART CATH AND CORONARY ANGIOGRAPHY N/A 07/12/2018   Procedure: LEFT HEART CATH AND CORONARY ANGIOGRAPHY;  Surgeon: Troy Sine, MD;  Location: [facility];  Service: Cardiovascular;  Laterality: N/A;  . Surgery left great toe    . Tear ducts bilateral eyes    . TRANSMETATARSAL AMPUTATION Right 07/08/2018   Social History   Occupational History  . Occupation: disabled  Tobacco Use  . Smoking status: Former Smoker    Packs/day: 0.30    Years: 48.00    Pack years: 14.40    Types: Cigars, Cigarettes    Start date:  07/02/1963    Quit date: 06/01/2019    Years since quitting: 0.7  . Smokeless tobacco: Former Network engineer  . Vaping Use: Former  Substance and Sexual Activity  . Alcohol use: Not Currently    Alcohol/week: 0.0 standard drinks  . Drug use: No  . Sexual activity: Not on file

## 2020-03-13 ENCOUNTER — Ambulatory Visit (INDEPENDENT_AMBULATORY_CARE_PROVIDER_SITE_OTHER): Payer: No Typology Code available for payment source | Admitting: Physician Assistant

## 2020-03-13 ENCOUNTER — Encounter: Payer: Self-pay | Admitting: Physician Assistant

## 2020-03-13 VITALS — Ht 70.0 in | Wt 170.0 lb

## 2020-03-13 DIAGNOSIS — Z89511 Acquired absence of right leg below knee: Secondary | ICD-10-CM

## 2020-03-13 NOTE — Progress Notes (Signed)
Office Visit Note   Patient: Adam Ray           Date of Birth: June 24, 1956           MRN: [id number] Visit Date: 03/13/2020              Requested by: [name], [address] [place],  [place] [address] PCP: [name], MD  Chief Complaint  Patient presents with  . Right Leg - Routine Post Op    02/23/20 right BKA revision       HPI: This is a pleasant gentleman who is now almost 3 weeks status post right below-knee amputation revision.  Overall he is feeling well his wife has been using his shrinker.  On the medial side she has been putting something beneath the shrinker.  She is also asking me about the use of hydrogen peroxide   Assessment & Plan: Visit Diagnoses: No diagnosis found.  Plan: I emphasized the importance that his leg must be elevated above heart level.  When he he is home he should not have his leg dangling down.  I did remove some of the sutures but left some in especially around the medial area.  I also told her not to use hydrogen peroxide  Follow-Up Instructions: No follow-ups on file.   Ortho Exam  Patient is alert, oriented, no adenopathy, well-dressed, normal affect, normal respiratory effort. Overall well apposed wound edges.  On the lateral side things are completely healed sutures were harvested medially he does have some maceration of the skin with some very superficial dehiscence.  He has significant swelling throughout the stump.  No cellulitis no foul odor  Imaging: No results found. No images are attached to the encounter.  Labs: Lab Results  Component Value Date   HGBA1C 8.7 (H) 01/19/2020   HGBA1C 7.2 (A) 10/28/2019   HGBA1C 7.6 (A) 05/31/2019   ESRSEDRATE 54 (H) 01/01/2020   ESRSEDRATE 8 01/29/2016   ESRSEDRATE 9 04/11/2012   CRP 2.6 (H) 01/29/2016   CRP 0.5 02/28/2014   REPTSTATUS 01/18/2020 FINAL 01/17/2020   REPTSTATUS 01/22/2020 FINAL 01/17/2020   REPTSTATUS 01/22/2020 FINAL 01/17/2020    GRAMSTAIN  04/11/2012    NO WBC SEEN RARE SQUAMOUS EPITHELIAL CELLS PRESENT MODERATE GRAM POSITIVE COCCI IN PAIRS RARE GRAM NEGATIVE RODS   GRAMSTAIN  04/11/2012    NO WBC SEEN RARE SQUAMOUS EPITHELIAL CELLS PRESENT MODERATE GRAM POSITIVE COCCI IN PAIRS RARE GRAM NEGATIVE RODS   CULT (A) 01/17/2020    <10,000 COLONIES/mL INSIGNIFICANT GROWTH Performed at [facility], [address], [place], [place] [address]    CULT  01/17/2020    NO GROWTH 5 DAYS Performed at [facility] [address], [place], [place] [address]    CULT  01/17/2020    NO GROWTH 5 DAYS Performed at [facility] [address] [place], [place] [address]    LABORGA NO GROWTH 2 DAYS 07/19/2014     Lab Results  Component Value Date   ALBUMIN 3.7 02/23/2020   ALBUMIN 3.0 (L) 01/18/2020   ALBUMIN 3.4 (L) 01/17/2020    No results found for: MG No results found for: VD25OH  No results found for: PREALBUMIN CBC EXTENDED Latest Ref Rng & Units 02/23/2020 01/27/2020 01/19/2020  WBC 4.0 - 10.5 K/uL 3.9(L) 3.1(L) 3.2(L)  RBC 4.22 - 5.81 MIL/uL 3.97(L) 3.52(L) 3.42(L)  HGB 13.0 - 17.0 g/dL 11.6(L) 10.5(L) 9.9(L)  HCT 39 - 52 %  35.9(L) 31.2(L) 30.4(L)  PLT 150 - 400 K/uL 178 132(L) 167  NEUTROABS 1.7 - 7.7 K/uL - - -  LYMPHSABS 0.7 - 4.0 K/uL - - -     Body mass index is 24.39 kg/m.  Orders:  No orders of the defined types were placed in this encounter.  No orders of the defined types were placed in this encounter.    Procedures: No procedures performed  Clinical Data: No additional findings.  ROS:  All other systems negative, except as noted in the HPI. Review of Systems  Objective: Vital Signs: Ht 5\' 10"  (1.778 m)   Wt 170 lb (77.1 kg)   BMI 24.39 kg/m   Specialty Comments:  No specialty comments available.  PMFS History: Patient Active Problem List   Diagnosis Date Noted  . Dehiscence of amputation stump ([place])   . Acquired absence of right leg below knee ([place])  02/09/2020  . Coronary artery disease 01/18/2020  . AKI (acute kidney injury) ([place]) 01/17/2020  . Nocturnal leg cramps 10/28/2019  . Hyperlipidemia 05/26/2019  . Edema leg 12/29/2018  . STEMI involving left circumflex coronary artery ([place]) 07/12/2018  . Status post transmetatarsal amputation of foot, right ([place]) 07/08/2018  . Limited mobility 05/12/2018  . Gingival foreign body 07/22/2017  . Anxiety state 06/02/2017  . Leg swelling 01/29/2017  . Idiopathic chronic venous hypertension of both lower extremities with inflammation 09/24/2016  . Testicular mass 04/18/2016  . Subacute osteomyelitis, right ankle and foot ([place]) 01/29/2016  . Diabetic polyneuropathy associated with type 2 diabetes mellitus ([place])   . Type 2 diabetes mellitus with neurologic complication, with long-term current use of insulin ([place]) 12/08/2015  . Pulmonary nodule 02/01/2015  . Depression 12/09/2013  . Warts, genital 08/20/2013  . 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 Diabetic retinopathy ([place]) 01/28/2013  . Sleep apnea 09/06/2010  . 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 Essential hypertension 11/09/2008  . COPD, mild ([place]) 10/06/2006  . SICKLE-CELL TRAIT 04/26/2006  . ERECTILE DYSFUNCTION 04/26/2006  . Tobacco abuse 04/26/2006  . PAD (peripheral artery disease) ([place]) 04/26/2006  . ALLERGIC RHINITIS 04/26/2006   Past Medical History:  Diagnosis Date  . Allergy   . Arthritis   . Bronchitis   . Cataract   . CHF (congestive heart failure) ([place])   . Chronic kidney disease   . Claudication [facility])    right foot ray resection  . Colon polyps    hyperplastic  . COPD (chronic obstructive pulmonary disease) ([place])   . Coronary artery disease   . Diabetes mellitus    type II  . Genital warts   . Gout   . Hyperlipidemia   . Hypertension   . Myocardial infarction ([place])   . Osteomyelitis of third toe of right foot ([place])   . Pneumonia   . Status post amputation of toe of right  foot ([place]) 09/24/2016    Family History  Problem Relation Age of Onset  . Diabetes Mother   . Stroke Mother   . Heart failure Father   . Colon cancer Neg Hx   . Esophageal cancer Neg Hx   . Rectal cancer Neg Hx   . Stomach cancer Neg Hx     Past Surgical History:  Procedure Laterality Date  . AMPUTATION Right 01/31/2016   Procedure: Right 2nd Toe Amputation;  Surgeon: Newt Minion, MD;  Location: [facility];  Service: Orthopedics;  Laterality: Right;  . AMPUTATION Right 07/08/2018   Procedure: RIGHT TRANSMETATARSAL AMPUTATION;  Surgeon:  Newt Minion, MD;  Location: [facility];  Service: Orthopedics;  Laterality: Right;  . AMPUTATION Right 01/05/2020   Procedure: RIGHT BELOW KNEE AMPUTATION;  Surgeon: Newt Minion, MD;  Location: [facility];  Service: Orthopedics;  Laterality: Right;  . AMPUTATION Right 02/23/2020   Procedure: RIGHT BELOW KNEE AMPUTATION REVISION;  Surgeon: Newt Minion, MD;  Location: [facility];  Service: Orthopedics;  Laterality: Right;  . CATARACT EXTRACTION     right eye  . COLONOSCOPY    . CORONARY STENT INTERVENTION N/A 07/12/2018   Procedure: CORONARY STENT INTERVENTION;  Surgeon: Troy Sine, MD;  Location: [facility];  Service: Cardiovascular;  Laterality: N/A;  . CORONARY/GRAFT ACUTE MI REVASCULARIZATION N/A 07/12/2018   Procedure: Coronary/Graft Acute MI Revascularization;  Surgeon: Troy Sine, MD;  Location: [facility];  Service: Cardiovascular;  Laterality: N/A;  . I & D EXTREMITY  04/11/2012   Procedure: IRRIGATION AND DEBRIDEMENT EXTREMITY;  Surgeon: Wylene Simmer, MD;  Location: [facility];  Service: Orthopedics;  Laterality: Right;  . LEFT HEART CATH AND CORONARY ANGIOGRAPHY N/A 07/12/2018   Procedure: LEFT HEART CATH AND CORONARY ANGIOGRAPHY;  Surgeon: Troy Sine, MD;  Location: [facility];  Service: Cardiovascular;  Laterality: N/A;  . Surgery left great toe    . Tear ducts bilateral eyes    . TRANSMETATARSAL AMPUTATION Right 07/08/2018    Social History   Occupational History  . Occupation: disabled  Tobacco Use  . Smoking status: Former Smoker    Packs/day: 0.30    Years: 48.00    Pack years: 14.40    Types: Cigars, Cigarettes    Start date: 07/02/1963    Quit date: 06/01/2019    Years since quitting: 0.7  . Smokeless tobacco: Former Network engineer  . Vaping Use: Former  Substance and Sexual Activity  . Alcohol use: Not Currently    Alcohol/week: 0.0 standard drinks  . Drug use: No  . Sexual activity: Not on file

## 2020-03-14 ENCOUNTER — Telehealth: Payer: Self-pay | Admitting: Orthopedic Surgery

## 2020-03-14 NOTE — Telephone Encounter (Signed)
Received call from patient's wife [name] asking if patient can drive? The number to contact [name] is [phone]

## 2020-03-15 NOTE — Telephone Encounter (Signed)
I called and sw pt's wife to advise that he can not drive right now. He is still healing and not able to do this currently but when he is at that point where he can increase his activity there is a company in [employer] that works with the amputee population with hand controls for the steering wheel of the car. Voiced understanding will call with any questions.

## 2020-03-20 ENCOUNTER — Ambulatory Visit: Payer: No Typology Code available for payment source | Admitting: Physician Assistant

## 2020-03-24 ENCOUNTER — Ambulatory Visit: Payer: No Typology Code available for payment source | Admitting: Physician Assistant

## 2020-03-24 ENCOUNTER — Ambulatory Visit: Payer: Self-pay

## 2020-03-24 ENCOUNTER — Encounter: Payer: Self-pay | Admitting: Physician Assistant

## 2020-03-24 ENCOUNTER — Ambulatory Visit (INDEPENDENT_AMBULATORY_CARE_PROVIDER_SITE_OTHER): Payer: No Typology Code available for payment source | Admitting: Physician Assistant

## 2020-03-24 VITALS — Ht 70.0 in | Wt 170.0 lb

## 2020-03-24 DIAGNOSIS — I87323 Chronic venous hypertension (idiopathic) with inflammation of bilateral lower extremity: Secondary | ICD-10-CM

## 2020-03-24 NOTE — Progress Notes (Signed)
Office Visit Note   Patient: Adam Ray           Date of Birth: 03/31/1956           MRN: [id number] Visit Date: 03/24/2020              Requested by: [name], [address] [place],  [place] [address] PCP: [name], MD  Chief Complaint  Patient presents with  . Right Leg - Follow-up    Right below knee amputation 02/23/2020      HPI: This is a pleasant 64 year old gentleman who is approximately 1 month status post right below-knee amputation revision he is accompanied by his wife today.  She has been doing daily dressing changes and is concerned that he is slow to heal she is also concerned about significant swelling he has had in his left foot and heel.  She has been given a prescription for a compression sock from [place] however she has difficulty getting transportation  Assessment & Plan: Visit Diagnoses:  1. Idiopathic chronic venous hypertension of both lower extremities with inflammation     Plan: I applied silver cell and a compression dressing.  She may switch back to the shrinker.  When she has a clean 1.  On the left I did apply an extra-large  Vive compression stocking patient's wife has requested to see Dr. Sharol Given at her next visit.  I demonstrated how to apply the compression stocking and she will continue to do this told her to be wary of any wrinkling of the sock.  Follow-Up Instructions: No follow-ups on file.   Ortho Exam  Patient is alert, oriented, no adenopathy, well-dressed, normal affect, normal respiratory effort. Right below-knee amputation stump.  Remaining sutures were removed there is 1 area that is about 2 cm in diameter that is has some bloody bleeding.  This does probe deeply.  There is no foul odor no surrounding cellulitis also laterally there is 1 area of dehiscence that is about 2 mm wide and about 3 mm long.  Sutures him both areas were in the middle of the wound and no longer providing support.  Mild to moderate  soft tissue swelling. On the left foot he has significant swelling in the heel and the foot.  He has biphasic tibialis anterior and posterior tibial tendons by Doppler.  Foot was too swollen to feel pulses.  Skin overall is in fair condition.  He does have some maceration in the first webspace.  No ascending cellulitis.  Imaging: No results found. No images are attached to the encounter.  Labs: Lab Results  Component Value Date   HGBA1C 8.7 (H) 01/19/2020   HGBA1C 7.2 (A) 10/28/2019   HGBA1C 7.6 (A) 05/31/2019   ESRSEDRATE 54 (H) 01/01/2020   ESRSEDRATE 8 01/29/2016   ESRSEDRATE 9 04/11/2012   CRP 2.6 (H) 01/29/2016   CRP 0.5 02/28/2014   REPTSTATUS 01/18/2020 FINAL 01/17/2020   REPTSTATUS 01/22/2020 FINAL 01/17/2020   REPTSTATUS 01/22/2020 FINAL 01/17/2020   GRAMSTAIN  04/11/2012    NO WBC SEEN RARE SQUAMOUS EPITHELIAL CELLS PRESENT MODERATE GRAM POSITIVE COCCI IN PAIRS RARE GRAM NEGATIVE RODS   GRAMSTAIN  04/11/2012    NO WBC SEEN RARE SQUAMOUS EPITHELIAL CELLS PRESENT MODERATE GRAM POSITIVE COCCI IN PAIRS RARE GRAM NEGATIVE RODS   CULT (A) 01/17/2020    <10,000 COLONIES/mL INSIGNIFICANT GROWTH Performed at [facility], [address] [place], [place] [address]    CULT  01/17/2020    NO GROWTH 5 DAYS Performed at [facility], [place] [address], [place], [place] [address]    CULT  01/17/2020    NO GROWTH 5 DAYS Performed at [facility] [address], [place], [place] [address]    LABORGA NO GROWTH 2 DAYS 07/19/2014     Lab Results  Component Value Date   ALBUMIN 3.7 02/23/2020   ALBUMIN 3.0 (L) 01/18/2020   ALBUMIN 3.4 (L) 01/17/2020    No results found for: MG No results found for: VD25OH  No results found for: PREALBUMIN CBC EXTENDED Latest Ref Rng & Units 02/23/2020 01/27/2020 01/19/2020  WBC 4.0 - 10.5 K/uL 3.9(L) 3.1(L) 3.2(L)  RBC 4.22 - 5.81 MIL/uL 3.97(L) 3.52(L) 3.42(L)  HGB 13.0 - 17.0 g/dL 11.6(L) 10.5(L) 9.9(L)  HCT 39 - 52  % 35.9(L) 31.2(L) 30.4(L)  PLT 150 - 400 K/uL 178 132(L) 167  NEUTROABS 1.7 - 7.7 K/uL - - -  LYMPHSABS 0.7 - 4.0 K/uL - - -     Body mass index is 24.39 kg/m.  Orders:  Orders Placed This Encounter  Procedures  . XR Foot Complete Left   No orders of the defined types were placed in this encounter.    Procedures: No procedures performed  Clinical Data: No additional findings.  ROS:  All other systems negative, except as noted in the HPI. Review of Systems  Objective: Vital Signs: Ht 5\' 10"  (1.778 m)   Wt 170 lb (77.1 kg)   BMI 24.39 kg/m   Specialty Comments:  No specialty comments available.  PMFS History: Patient Active Problem List   Diagnosis Date Noted  . Dehiscence of amputation stump ([place])   . Acquired absence of right leg below knee ([place]) 02/09/2020  . Coronary artery disease 01/18/2020  . AKI (acute kidney injury) ([place]) 01/17/2020  . Nocturnal leg cramps 10/28/2019  . Hyperlipidemia 05/26/2019  . Edema leg 12/29/2018  . STEMI involving left circumflex coronary artery ([place]) 07/12/2018  . Status post transmetatarsal amputation of foot, right ([place]) 07/08/2018  . Limited mobility 05/12/2018  . Gingival foreign body 07/22/2017  . Anxiety state 06/02/2017  . Leg swelling 01/29/2017  . Idiopathic chronic venous hypertension of both lower extremities with inflammation 09/24/2016  . Testicular mass 04/18/2016  . Subacute osteomyelitis, right ankle and foot ([place]) 01/29/2016  . Diabetic polyneuropathy associated with type 2 diabetes mellitus ([place])   . Type 2 diabetes mellitus with neurologic complication, with long-term current use of insulin ([place]) 12/08/2015  . Pulmonary nodule 02/01/2015  . Depression 12/09/2013  . Warts, genital 08/20/2013  . 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 Diabetic retinopathy ([place]) 01/28/2013  . Sleep apnea 09/06/2010  . 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 Essential hypertension 11/09/2008  .  COPD, mild ([place]) 10/06/2006  . SICKLE-CELL TRAIT 04/26/2006  . ERECTILE DYSFUNCTION 04/26/2006  . Tobacco abuse 04/26/2006  . PAD (peripheral artery disease) ([place]) 04/26/2006  . ALLERGIC RHINITIS 04/26/2006   Past Medical History:  Diagnosis Date  . Allergy   . Arthritis   . Bronchitis   . Cataract   . CHF (congestive heart failure) ([place])   . Chronic kidney disease   . Claudication [facility])    right foot ray resection  . Colon polyps    hyperplastic  . COPD (chronic obstructive pulmonary disease) ([place])   . Coronary artery disease   . Diabetes mellitus    type II  . Genital warts   . Gout   .  Hyperlipidemia   . Hypertension   . Myocardial infarction ([place])   . Osteomyelitis of third toe of right foot ([place])   . Pneumonia   . Status post amputation of toe of right foot ([place]) 09/24/2016    Family History  Problem Relation Age of Onset  . Diabetes Mother   . Stroke Mother   . Heart failure Father   . Colon cancer Neg Hx   . Esophageal cancer Neg Hx   . Rectal cancer Neg Hx   . Stomach cancer Neg Hx     Past Surgical History:  Procedure Laterality Date  . AMPUTATION Right 01/31/2016   Procedure: Right 2nd Toe Amputation;  Surgeon: Newt Minion, MD;  Location: [facility];  Service: Orthopedics;  Laterality: Right;  . AMPUTATION Right 07/08/2018   Procedure: RIGHT TRANSMETATARSAL AMPUTATION;  Surgeon: Newt Minion, MD;  Location: [facility];  Service: Orthopedics;  Laterality: Right;  . AMPUTATION Right 01/05/2020   Procedure: RIGHT BELOW KNEE AMPUTATION;  Surgeon: Newt Minion, MD;  Location: [facility];  Service: Orthopedics;  Laterality: Right;  . AMPUTATION Right 02/23/2020   Procedure: RIGHT BELOW KNEE AMPUTATION REVISION;  Surgeon: Newt Minion, MD;  Location: [facility];  Service: Orthopedics;  Laterality: Right;  . CATARACT EXTRACTION     right eye  . COLONOSCOPY    . CORONARY STENT INTERVENTION N/A 07/12/2018   Procedure: CORONARY STENT INTERVENTION;  Surgeon: Troy Sine, MD;   Location: [facility];  Service: Cardiovascular;  Laterality: N/A;  . CORONARY/GRAFT ACUTE MI REVASCULARIZATION N/A 07/12/2018   Procedure: Coronary/Graft Acute MI Revascularization;  Surgeon: Troy Sine, MD;  Location: [facility];  Service: Cardiovascular;  Laterality: N/A;  . I & D EXTREMITY  04/11/2012   Procedure: IRRIGATION AND DEBRIDEMENT EXTREMITY;  Surgeon: Wylene Simmer, MD;  Location: [facility];  Service: Orthopedics;  Laterality: Right;  . LEFT HEART CATH AND CORONARY ANGIOGRAPHY N/A 07/12/2018   Procedure: LEFT HEART CATH AND CORONARY ANGIOGRAPHY;  Surgeon: Troy Sine, MD;  Location: [facility];  Service: Cardiovascular;  Laterality: N/A;  . Surgery left great toe    . Tear ducts bilateral eyes    . TRANSMETATARSAL AMPUTATION Right 07/08/2018   Social History   Occupational History  . Occupation: disabled  Tobacco Use  . Smoking status: Former Smoker    Packs/day: 0.30    Years: 48.00    Pack years: 14.40    Types: Cigars, Cigarettes    Start date: 07/02/1963    Quit date: 06/01/2019    Years since quitting: 0.8  . Smokeless tobacco: Former Network engineer  . Vaping Use: Former  Substance and Sexual Activity  . Alcohol use: Not Currently    Alcohol/week: 0.0 standard drinks  . Drug use: No  . Sexual activity: Not on file

## 2020-03-27 ENCOUNTER — Other Ambulatory Visit: Payer: Self-pay

## 2020-03-27 ENCOUNTER — Encounter: Payer: Self-pay | Admitting: Family Medicine

## 2020-03-27 ENCOUNTER — Ambulatory Visit (INDEPENDENT_AMBULATORY_CARE_PROVIDER_SITE_OTHER): Payer: No Typology Code available for payment source | Admitting: Family Medicine

## 2020-03-27 VITALS — BP 100/65 | HR 54

## 2020-03-27 DIAGNOSIS — Z794 Long term (current) use of insulin: Secondary | ICD-10-CM

## 2020-03-27 DIAGNOSIS — N3941 Urge incontinence: Secondary | ICD-10-CM | POA: Diagnosis not present

## 2020-03-27 DIAGNOSIS — Z23 Encounter for immunization: Secondary | ICD-10-CM

## 2020-03-27 DIAGNOSIS — E1141 Type 2 diabetes mellitus with diabetic mononeuropathy: Secondary | ICD-10-CM | POA: Diagnosis not present

## 2020-03-27 LAB — POCT URINALYSIS DIP (MANUAL ENTRY)
Bilirubin, UA: NEGATIVE
Glucose, UA: 100 mg/dL — AB
Ketones, POC UA: NEGATIVE mg/dL
Leukocytes, UA: NEGATIVE
Nitrite, UA: NEGATIVE
Protein Ur, POC: NEGATIVE mg/dL
Spec Grav, UA: 1.025 (ref 1.010–1.025)
Urobilinogen, UA: 0.2 E.U./dL
pH, UA: 6.5 (ref 5.0–8.0)

## 2020-03-27 MED ORDER — FAMOTIDINE 20 MG PO TABS
20.0000 mg | ORAL_TABLET | Freq: Two times a day (BID) | ORAL | 1 refills | Status: DC
Start: 2020-03-27 — End: 2020-08-10

## 2020-03-27 NOTE — Patient Instructions (Addendum)
Increase lantus to 70u twice daily Keep monitoring sugars  Send me a message with your sugar each morning, as well as what dose of lantus you've been taking.  Username: [email or web address] Password: [email or web address]  I will reply and let you know what to do with your lantus.  Continue on the novolog 40u with meals.  Checking urine today for signs of infections  Flu shot today  Refilled your pepcid  Follow up with me in 1 month, will be due for next A1c at that time.  Be well, Dr. [name]

## 2020-03-27 NOTE — Progress Notes (Signed)
  Date of Visit: 03/27/2020   SUBJECTIVE:   HPI:  Adam Ray presents today for routine follow up.  Diabetes - taking lantus 60u twice daily, as well as novolog 40u with meals (twice a day). He increased his lantus on his own as his sugars were running high. Sugars are still running high consistently in 300s. He checks them most days, not always when fasting. No low sugars.   GERD - takes pepcid 20mg  twice daily. Working well. Needs refill.  Has appointment for his leg (recent BKA revision) this week. Of note he is concerned his L leg is having issues too. Has had some swelling there for some time.  Urinary symptoms - endorses having some urinary incontinence lately. No dysuria. No fevers. Sometimes strains to urinate. He is able to tell when he has to pee, just sometimes can't get it to stop once it starts. Has not seen urology recently. No history of BPH.  OBJECTIVE:   BP 100/65   Pulse (!) 54   SpO2 100%  Gen: no acute distress, pleasant cooperative HEENT: normocephalic, atraumatic  Lungs: normal work of breathing  Abdomen: soft, nontender to palpation, no masses Back: no CVA tenderness Ext: LLE with chronic swelling and fairly severe bunion deformity of L foot. No warmth or erythema. No tenderness.  ASSESSMENT/PLAN:   Health maintenance:  -flu shot today  Type 2 diabetes mellitus with neurologic complication, with long-term current use of insulin (HCC) Sugars elevated based on home readings. Increase Lantus to 70u twice daily. Spent the bulk of the visit helping him get set up with mychart on his phone so that he can send me messages with his blood sugars. This will enable [place] to more promptly titrate his Lantus. Follow up in 1 month for next A1c  Urinary incontinence UA unremarkable today Suspect BPH - advised we can evaluate this here or he can see urology He brought this issue up right at the end of the visit, so was not able to fully evaluate or draw PSA today as lab was  closed. Can discuss at follow up.   GERD stable. Refill pepcid  Recommend he sees podiatry regularly for his L foot to prevent another amputation. No signs of acute limb issues today.   [place]. [name], [place]

## 2020-03-28 ENCOUNTER — Ambulatory Visit: Payer: No Typology Code available for payment source | Admitting: Physician Assistant

## 2020-03-28 ENCOUNTER — Ambulatory Visit: Payer: No Typology Code available for payment source | Admitting: Podiatry

## 2020-03-29 ENCOUNTER — Ambulatory Visit: Payer: No Typology Code available for payment source | Admitting: Podiatry

## 2020-03-30 ENCOUNTER — Ambulatory Visit (INDEPENDENT_AMBULATORY_CARE_PROVIDER_SITE_OTHER): Payer: No Typology Code available for payment source | Admitting: Orthopedic Surgery

## 2020-03-30 ENCOUNTER — Encounter: Payer: Self-pay | Admitting: Orthopedic Surgery

## 2020-03-30 ENCOUNTER — Other Ambulatory Visit: Payer: Self-pay

## 2020-03-30 VITALS — Ht 70.0 in | Wt 170.0 lb

## 2020-03-30 DIAGNOSIS — Z89511 Acquired absence of right leg below knee: Secondary | ICD-10-CM

## 2020-04-02 NOTE — Assessment & Plan Note (Addendum)
Sugars elevated based on home readings. Increase Lantus to 70u twice daily. Spent the bulk of the visit helping him get set up with mychart on his phone so that he can send me messages with his blood sugars. This will enable [place] to more promptly titrate his Lantus. Follow up in 1 month for next A1c

## 2020-04-03 ENCOUNTER — Encounter: Payer: Self-pay | Admitting: Orthopedic Surgery

## 2020-04-03 NOTE — Progress Notes (Signed)
Office Visit Note   Patient: Adam Ray           Date of Birth: [birth year]           MRN: [id number] Visit Date: 03/30/2020              Requested by: [name], [address] [place],  [place] [address] PCP: [name], MD  Chief Complaint  Patient presents with  . Right Leg - Routine Post Op    02/23/20 right BKA revision     In with [employer] for prosthetic fitting.  HPI: Patient is a 64 year old gentleman who presents 4 weeks status post revision right transtibial amputation.  Patient has some open areas secondary to blistering from swelling.  Patient has used silver cell gauze and a shrinker.  He is currently work  Assessment & Plan: Visit Diagnoses:  1. Acquired absence of right leg below knee [facility])     Plan: Patient was given a sample of a 3 extra-large stump shrinker he was given a prescription to go to [employer]otech ideally he needs a 2 extra-large stump shrinker.  Follow-Up Instructions: Return in about 3 weeks (around 04/20/2020).   Ortho Exam  Patient is alert, oriented, no adenopathy, well-dressed, normal affect, normal respiratory effort. Examination patient has swelling and blistering secondary to the swelling he is currently in a 4 extra-large stump shrinker his calf measures 40 cm in circumference of 5 measures 45 cm in circumference which is consistent with a 2 extra-large shrinker.  There is blistering laterally from the swelling but no cellulitis the wounds are superficial.  Imaging: No results found. No images are attached to the encounter.  Labs: Lab Results  Component Value Date   HGBA1C 8.7 (H) 01/19/2020   HGBA1C 7.2 (A) 10/28/2019   HGBA1C 7.6 (A) 05/31/2019   ESRSEDRATE 54 (H) 01/01/2020   ESRSEDRATE 8 01/29/2016   ESRSEDRATE 9 04/11/2012   CRP 2.6 (H) 01/29/2016   CRP 0.5 02/28/2014   REPTSTATUS 01/18/2020 FINAL 01/17/2020   REPTSTATUS 01/22/2020 FINAL 01/17/2020   REPTSTATUS 01/22/2020 FINAL 01/17/2020    GRAMSTAIN  04/11/2012    NO WBC SEEN RARE SQUAMOUS EPITHELIAL CELLS PRESENT MODERATE GRAM POSITIVE COCCI IN PAIRS RARE GRAM NEGATIVE RODS   GRAMSTAIN  04/11/2012    NO WBC SEEN RARE SQUAMOUS EPITHELIAL CELLS PRESENT MODERATE GRAM POSITIVE COCCI IN PAIRS RARE GRAM NEGATIVE RODS   CULT (A) 01/17/2020    <10,000 COLONIES/mL INSIGNIFICANT GROWTH Performed at [facility], [address] [place], [place] [address]    CULT  01/17/2020    NO GROWTH 5 DAYS Performed at [facility] [address], [place], [place] [address]    CULT  01/17/2020    NO GROWTH 5 DAYS Performed at [facility] [address] [place], [place] [address]    LABORGA NO GROWTH 2 DAYS 07/19/2014     Lab Results  Component Value Date   ALBUMIN 3.7 02/23/2020   ALBUMIN 3.0 (L) 01/18/2020   ALBUMIN 3.4 (L) 01/17/2020    No results found for: MG No results found for: VD25OH  No results found for: PREALBUMIN CBC EXTENDED Latest Ref Rng & Units 02/23/2020 01/27/2020 01/19/2020  WBC 4.0 - 10.5 K/uL 3.9(L) 3.1(L) 3.2(L)  RBC 4.22 - 5.81 MIL/uL 3.97(L) 3.52(L) 3.42(L)  HGB 13.0 - 17.0 g/dL 11.6(L) 10.5(L) 9.9(L)  HCT 39 - 52 % 35.9(L) 31.2(L) 30.4(L)  PLT 150 - 400 K/uL 178 132(L)  167  NEUTROABS 1.7 - 7.7 K/uL - - -  LYMPHSABS 0.7 - 4.0 K/uL - - -     Body mass index is 24.39 kg/m.  Orders:  No orders of the defined types were placed in this encounter.  No orders of the defined types were placed in this encounter.    Procedures: No procedures performed  Clinical Data: No additional findings.  ROS:  All other systems negative, except as noted in the HPI. Review of Systems  Objective: Vital Signs: Ht 5\' 10"  (1.778 m)   Wt 170 lb (77.1 kg)   BMI 24.39 kg/m   Specialty Comments:  No specialty comments available.  PMFS History: Patient Active Problem List   Diagnosis Date Noted  . Dehiscence of amputation stump ([place])   . Acquired absence of right leg below knee ([place])  02/09/2020  . Coronary artery disease 01/18/2020  . AKI (acute kidney injury) ([place]) 01/17/2020  . Nocturnal leg cramps 10/28/2019  . Hyperlipidemia 05/26/2019  . Edema leg 12/29/2018  . STEMI involving left circumflex coronary artery ([place]) 07/12/2018  . Status post transmetatarsal amputation of foot, right ([place]) 07/08/2018  . Limited mobility 05/12/2018  . Gingival foreign body 07/22/2017  . Anxiety state 06/02/2017  . Leg swelling 01/29/2017  . Idiopathic chronic venous hypertension of both lower extremities with inflammation 09/24/2016  . Testicular mass 04/18/2016  . Subacute osteomyelitis, right ankle and foot ([place]) 01/29/2016  . Diabetic polyneuropathy associated with type 2 diabetes mellitus ([place])   . Type 2 diabetes mellitus with neurologic complication, with long-term current use of insulin ([place]) 12/08/2015  . Pulmonary nodule 02/01/2015  . Depression 12/09/2013  . Warts, genital 08/20/2013  . 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 Diabetic retinopathy ([place]) 01/28/2013  . Sleep apnea 09/06/2010  . 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 Essential hypertension 11/09/2008  . COPD, mild ([place]) 10/06/2006  . SICKLE-CELL TRAIT 04/26/2006  . ERECTILE DYSFUNCTION 04/26/2006  . Tobacco abuse 04/26/2006  . PAD (peripheral artery disease) ([place]) 04/26/2006  . ALLERGIC RHINITIS 04/26/2006   Past Medical History:  Diagnosis Date  . Allergy   . Arthritis   . Bronchitis   . Cataract   . CHF (congestive heart failure) ([place])   . Chronic kidney disease   . Claudication [facility])    right foot ray resection  . Colon polyps    hyperplastic  . COPD (chronic obstructive pulmonary disease) ([place])   . Coronary artery disease   . Diabetes mellitus    type II  . Genital warts   . Gout   . Hyperlipidemia   . Hypertension   . Myocardial infarction ([place])   . Osteomyelitis of third toe of right foot ([place])   . Pneumonia   . Status post amputation of toe of right  foot ([place]) 09/24/2016    Family History  Problem Relation Age of Onset  . Diabetes Mother   . Stroke Mother   . Heart failure Father   . Colon cancer Neg Hx   . Esophageal cancer Neg Hx   . Rectal cancer Neg Hx   . Stomach cancer Neg Hx     Past Surgical History:  Procedure Laterality Date  . AMPUTATION Right 01/31/2016   Procedure: Right 2nd Toe Amputation;  Surgeon: Newt Minion, MD;  Location: [facility];  Service: Orthopedics;  Laterality: Right;  . AMPUTATION Right 07/08/2018   Procedure: RIGHT TRANSMETATARSAL AMPUTATION;  Surgeon: Newt Minion, MD;  Location: [facility];  Service: Orthopedics;  Laterality: Right;  . AMPUTATION Right 01/05/2020   Procedure: RIGHT BELOW KNEE AMPUTATION;  Surgeon: Newt Minion, MD;  Location: [facility];  Service: Orthopedics;  Laterality: Right;  . AMPUTATION Right 02/23/2020   Procedure: RIGHT BELOW KNEE AMPUTATION REVISION;  Surgeon: Newt Minion, MD;  Location: [facility];  Service: Orthopedics;  Laterality: Right;  . CATARACT EXTRACTION     right eye  . COLONOSCOPY    . CORONARY STENT INTERVENTION N/A 07/12/2018   Procedure: CORONARY STENT INTERVENTION;  Surgeon: Troy Sine, MD;  Location: [facility];  Service: Cardiovascular;  Laterality: N/A;  . CORONARY/GRAFT ACUTE MI REVASCULARIZATION N/A 07/12/2018   Procedure: Coronary/Graft Acute MI Revascularization;  Surgeon: Troy Sine, MD;  Location: [facility];  Service: Cardiovascular;  Laterality: N/A;  . I & D EXTREMITY  04/11/2012   Procedure: IRRIGATION AND DEBRIDEMENT EXTREMITY;  Surgeon: Wylene Simmer, MD;  Location: [facility];  Service: Orthopedics;  Laterality: Right;  . LEFT HEART CATH AND CORONARY ANGIOGRAPHY N/A 07/12/2018   Procedure: LEFT HEART CATH AND CORONARY ANGIOGRAPHY;  Surgeon: Troy Sine, MD;  Location: [facility];  Service: Cardiovascular;  Laterality: N/A;  . Surgery left great toe    . Tear ducts bilateral eyes    . TRANSMETATARSAL AMPUTATION Right 07/08/2018    Social History   Occupational History  . Occupation: disabled  Tobacco Use  . Smoking status: Former Smoker    Packs/day: 0.30    Years: 48.00    Pack years: 14.40    Types: Cigars, Cigarettes    Start date: 07/02/1963    Quit date: 06/01/2019    Years since quitting: 0.8  . Smokeless tobacco: Former Network engineer  . Vaping Use: Former  Substance and Sexual Activity  . Alcohol use: Not Currently    Alcohol/week: 0.0 standard drinks  . Drug use: No  . Sexual activity: Not on file

## 2020-04-14 ENCOUNTER — Telehealth: Payer: Self-pay | Admitting: Orthopedic Surgery

## 2020-04-14 ENCOUNTER — Ambulatory Visit: Payer: No Typology Code available for payment source | Admitting: Family Medicine

## 2020-04-14 NOTE — Telephone Encounter (Signed)
Patient's wife [name] called asked if patient can drive? The number to contact [name] is [phone]

## 2020-04-14 NOTE — Telephone Encounter (Signed)
Please see below and advise.

## 2020-04-17 NOTE — Telephone Encounter (Signed)
I called and sw pt's wife to advise that he can not drive. He is s/p a right BKA. Can discuss with Dr. Sharol Given at next visit.

## 2020-04-20 ENCOUNTER — Ambulatory Visit (INDEPENDENT_AMBULATORY_CARE_PROVIDER_SITE_OTHER): Payer: No Typology Code available for payment source | Admitting: Physician Assistant

## 2020-04-20 ENCOUNTER — Encounter: Payer: Self-pay | Admitting: Orthopedic Surgery

## 2020-04-20 VITALS — Ht 70.0 in | Wt 170.0 lb

## 2020-04-20 DIAGNOSIS — M86271 Subacute osteomyelitis, right ankle and foot: Secondary | ICD-10-CM

## 2020-04-20 NOTE — Progress Notes (Signed)
Office Visit Note   Patient: Adam Ray           Date of Birth: 1956-01-25           MRN: [id number] Visit Date: 04/20/2020              Requested by: [name], [place],  [place] [address] PCP: [name], MD  Chief Complaint  Patient presents with  . Right Leg - Routine Post Op    02/23/20 right BKA revision       HPI: Patient presents today 2 months status post right below-knee amputation revision.  He has been wearing his shrinker.  He was provided a prescription to [employer] to fashion a prosthetic.  His wife is also been getting calls from the [place] and is wondering if she should have the prosthetic made there   Assessment & Plan: Visit Diagnoses: No diagnosis found.  Plan: Patient is ready to start working to have his prosthetic made.  I told his wife that she should look into [employer] and the [place] and compare timeframes when they can have the prosthetic ready as well as obviously causing.  They will follow-up with [place] in 1 month  Follow-Up Instructions: No follow-ups on file.   Ortho Exam  Patient is alert, oriented, no adenopathy, well-dressed, normal affect, normal respiratory effort. Focused examination demonstrates well-healed surgical incision.  Mild to moderate soft tissue swelling.  No erythema no drainage overall stump is in excellent condition  Imaging: No results found. No images are attached to the encounter.  Labs: Lab Results  Component Value Date   HGBA1C 8.7 (H) 01/19/2020   HGBA1C 7.2 (A) 10/28/2019   HGBA1C 7.6 (A) 05/31/2019   ESRSEDRATE 54 (H) 01/01/2020   ESRSEDRATE 8 01/29/2016   ESRSEDRATE 9 04/11/2012   CRP 2.6 (H) 01/29/2016   CRP 0.5 02/28/2014   REPTSTATUS 01/18/2020 FINAL 01/17/2020   REPTSTATUS 01/22/2020 FINAL 01/17/2020   REPTSTATUS 01/22/2020 FINAL 01/17/2020   GRAMSTAIN  04/11/2012    NO WBC SEEN RARE SQUAMOUS EPITHELIAL CELLS PRESENT MODERATE GRAM POSITIVE COCCI IN PAIRS RARE GRAM  NEGATIVE RODS   GRAMSTAIN  04/11/2012    NO WBC SEEN RARE SQUAMOUS EPITHELIAL CELLS PRESENT MODERATE GRAM POSITIVE COCCI IN PAIRS RARE GRAM NEGATIVE RODS   CULT (A) 01/17/2020    <10,000 COLONIES/mL INSIGNIFICANT GROWTH Performed at [facility], [address], [place], [place] [address]    CULT  01/17/2020    NO GROWTH 5 DAYS Performed at [facility] [address] [place], [place] [address]    CULT  01/17/2020    NO GROWTH 5 DAYS Performed at [facility] [address] [place], [place] [address]    LABORGA NO GROWTH 2 DAYS 07/19/2014     Lab Results  Component Value Date   ALBUMIN 3.7 02/23/2020   ALBUMIN 3.0 (L) 01/18/2020   ALBUMIN 3.4 (L) 01/17/2020    No results found for: MG No results found for: VD25OH  No results found for: PREALBUMIN CBC EXTENDED Latest Ref Rng & Units 02/23/2020 01/27/2020 01/19/2020  WBC 4.0 - 10.5 K/uL 3.9(L) 3.1(L) 3.2(L)  RBC 4.22 - 5.81 MIL/uL 3.97(L) 3.52(L) 3.42(L)  HGB 13.0 - 17.0 g/dL 11.6(L) 10.5(L) 9.9(L)  HCT 39 - 52 % 35.9(L) 31.2(L) 30.4(L)  PLT 150 - 400 K/uL 178 132(L) 167  NEUTROABS 1.7 - 7.7 K/uL - - -  LYMPHSABS 0.7 - 4.0 K/uL - - -  Body mass index is 24.39 kg/m.  Orders:  No orders of the defined types were placed in this encounter.  No orders of the defined types were placed in this encounter.    Procedures: No procedures performed  Clinical Data: No additional findings.  ROS:  All other systems negative, except as noted in the HPI. Review of Systems  Objective: Vital Signs: Ht 5\' 10"  (1.778 m)   Wt 170 lb (77.1 kg)   BMI 24.39 kg/m   Specialty Comments:  No specialty comments available.  PMFS History: Patient Active Problem List   Diagnosis Date Noted  . Dehiscence of amputation stump ([place])   . Acquired absence of right leg below knee ([place]) 02/09/2020  . Coronary artery disease 01/18/2020  . AKI (acute kidney injury) ([place]) 01/17/2020  . Nocturnal leg cramps  10/28/2019  . Hyperlipidemia 05/26/2019  . Edema leg 12/29/2018  . STEMI involving left circumflex coronary artery ([place]) 07/12/2018  . Status post transmetatarsal amputation of foot, right ([place]) 07/08/2018  . Limited mobility 05/12/2018  . Gingival foreign body 07/22/2017  . Anxiety state 06/02/2017  . Leg swelling 01/29/2017  . Idiopathic chronic venous hypertension of both lower extremities with inflammation 09/24/2016  . Testicular mass 04/18/2016  . Subacute osteomyelitis, right ankle and foot ([place]) 01/29/2016  . Diabetic polyneuropathy associated with type 2 diabetes mellitus ([place])   . Type 2 diabetes mellitus with neurologic complication, with long-term current use of insulin ([place]) 12/08/2015  . Pulmonary nodule 02/01/2015  . Depression 12/09/2013  . Warts, genital 08/20/2013  . 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 Diabetic retinopathy ([facility]) 01/28/2013  . Sleep apnea 09/06/2010  . 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 Essential hypertension 11/09/2008  . COPD, mild ([place]) 10/06/2006  . SICKLE-CELL TRAIT 04/26/2006  . ERECTILE DYSFUNCTION 04/26/2006  . Tobacco abuse 04/26/2006  . PAD (peripheral artery disease) ([facility]) 04/26/2006  . ALLERGIC RHINITIS 04/26/2006   Past Medical History:  Diagnosis Date  . Allergy   . Arthritis   . Bronchitis   . Cataract   . CHF (congestive heart failure) ([place])   . Chronic kidney disease   . Claudication [facility])    right foot ray resection  . Colon polyps    hyperplastic  . COPD (chronic obstructive pulmonary disease) ([place])   . Coronary artery disease   . Diabetes mellitus    type II  . Genital warts   . Gout   . Hyperlipidemia   . Hypertension   . Myocardial infarction ([place])   . Osteomyelitis of third toe of right foot ([place])   . Pneumonia   . Status post amputation of toe of right foot ([place]) 09/24/2016    Family History  Problem Relation Age of Onset  . Diabetes Mother   . Stroke Mother   . Heart  failure Father   . Colon cancer Neg Hx   . Esophageal cancer Neg Hx   . Rectal cancer Neg Hx   . Stomach cancer Neg Hx     Past Surgical History:  Procedure Laterality Date  . AMPUTATION Right 01/31/2016   Procedure: Right 2nd Toe Amputation;  Surgeon: Newt Minion, MD;  Location: [facility];  Service: Orthopedics;  Laterality: Right;  . AMPUTATION Right 07/08/2018   Procedure: RIGHT TRANSMETATARSAL AMPUTATION;  Surgeon: Newt Minion, MD;  Location: [facility];  Service: Orthopedics;  Laterality: Right;  . AMPUTATION Right 01/05/2020   Procedure: RIGHT BELOW KNEE AMPUTATION;  Surgeon: Newt Minion, MD;  Location:  [name];  Service: Orthopedics;  Laterality: Right;  . AMPUTATION Right 02/23/2020   Procedure: RIGHT BELOW KNEE AMPUTATION REVISION;  Surgeon: Newt Minion, MD;  Location: [facility];  Service: Orthopedics;  Laterality: Right;  . CATARACT EXTRACTION     right eye  . COLONOSCOPY    . CORONARY STENT INTERVENTION N/A 07/12/2018   Procedure: CORONARY STENT INTERVENTION;  Surgeon: Troy Sine, MD;  Location: [facility];  Service: Cardiovascular;  Laterality: N/A;  . CORONARY/GRAFT ACUTE MI REVASCULARIZATION N/A 07/12/2018   Procedure: Coronary/Graft Acute MI Revascularization;  Surgeon: Troy Sine, MD;  Location: [facility];  Service: Cardiovascular;  Laterality: N/A;  . I & D EXTREMITY  04/11/2012   Procedure: IRRIGATION AND DEBRIDEMENT EXTREMITY;  Surgeon: Wylene Simmer, MD;  Location: [facility];  Service: Orthopedics;  Laterality: Right;  . LEFT HEART CATH AND CORONARY ANGIOGRAPHY N/A 07/12/2018   Procedure: LEFT HEART CATH AND CORONARY ANGIOGRAPHY;  Surgeon: Troy Sine, MD;  Location: [facility];  Service: Cardiovascular;  Laterality: N/A;  . Surgery left great toe    . Tear ducts bilateral eyes    . TRANSMETATARSAL AMPUTATION Right 07/08/2018   Social History   Occupational History  . Occupation: disabled  Tobacco Use  . Smoking status: Former Smoker     Packs/day: 0.30    Years: 48.00    Pack years: 14.40    Types: Cigars, Cigarettes    Start date: 07/02/1963    Quit date: 06/01/2019    Years since quitting: 0.8  . Smokeless tobacco: Former Network engineer  . Vaping Use: Former  Substance and Sexual Activity  . Alcohol use: Not Currently    Alcohol/week: 0.0 standard drinks  . Drug use: No  . Sexual activity: Not on file

## 2020-04-26 ENCOUNTER — Encounter: Payer: Self-pay | Admitting: Podiatry

## 2020-04-26 ENCOUNTER — Other Ambulatory Visit: Payer: Self-pay

## 2020-04-26 ENCOUNTER — Ambulatory Visit (INDEPENDENT_AMBULATORY_CARE_PROVIDER_SITE_OTHER): Payer: No Typology Code available for payment source

## 2020-04-26 ENCOUNTER — Ambulatory Visit (INDEPENDENT_AMBULATORY_CARE_PROVIDER_SITE_OTHER): Payer: Medicare HMO | Admitting: Podiatry

## 2020-04-26 DIAGNOSIS — L03116 Cellulitis of left lower limb: Secondary | ICD-10-CM

## 2020-04-26 DIAGNOSIS — L03119 Cellulitis of unspecified part of limb: Secondary | ICD-10-CM

## 2020-04-26 DIAGNOSIS — E1151 Type 2 diabetes mellitus with diabetic peripheral angiopathy without gangrene: Secondary | ICD-10-CM

## 2020-04-26 DIAGNOSIS — L97529 Non-pressure chronic ulcer of other part of left foot with unspecified severity: Secondary | ICD-10-CM

## 2020-04-26 DIAGNOSIS — M79675 Pain in left toe(s): Secondary | ICD-10-CM | POA: Diagnosis not present

## 2020-04-26 DIAGNOSIS — E11621 Type 2 diabetes mellitus with foot ulcer: Secondary | ICD-10-CM | POA: Diagnosis not present

## 2020-04-26 DIAGNOSIS — B351 Tinea unguium: Secondary | ICD-10-CM

## 2020-04-26 DIAGNOSIS — S88119A Complete traumatic amputation at level between knee and ankle, unspecified lower leg, initial encounter: Secondary | ICD-10-CM

## 2020-04-27 NOTE — Progress Notes (Signed)
Subjective: Patient presents today with diabetes and h/o BKA right LE. He is accompanied by his wife.   New concern today: Wife relates left hallux has broken down about 2 weeks ago. She states it was a callus. She states she has been keeping it clean and she has been applying   [name], MD is patient's PCP. Last visit was 03/27/2020.  Current Outpatient Medications on File Prior to Visit  Medication Sig Dispense Refill  . ibuprofen (ADVIL) 600 MG tablet Take by mouth.    [name] acetaminophen (TYLENOL) 500 MG tablet Take 1,000 mg by mouth in the morning and at bedtime.     [name] albuterol (VENTOLIN HFA) 108 (90 Base) MCG/ACT inhaler Inhale 2 puffs into the lungs every 4 (four) hours as needed for wheezing or shortness of breath. 18 g 2  . aspirin 81 MG chewable tablet Chew 81 mg by mouth daily.    . baclofen (LIORESAL) 10 MG tablet Take 10 mg by mouth 2 (two) times daily.     . brimonidine (ALPHAGAN) 0.2 % ophthalmic solution     . calcium carbonate (OSCAL) 1500 (600 Ca) MG TABS tablet Take 600 mg of elemental calcium by mouth daily.    . Cholecalciferol (VITAMIN D3) 50 MCG (2000 UT) TABS Take 2,000 Units by mouth daily.    . diphenhydrAMINE (SOMINEX) 25 MG tablet Take by mouth.    . famotidine (PEPCID) 20 MG tablet Take 1 tablet (20 mg total) by mouth 2 (two) times daily. 180 tablet 1  . furosemide (LASIX) 20 MG tablet Take by mouth.    . insulin aspart (NOVOLOG) 100 UNIT/ML injection Inject 40 Units into the skin 3 (three) times daily before meals.    . insulin glargine (LANTUS) 100 UNIT/ML injection Inject 70 Units into the skin 2 (two) times daily.    [name] ketorolac (ACULAR) 0.5 % ophthalmic solution     . metoprolol tartrate (LOPRESSOR) 25 MG tablet Take 25 mg by mouth 2 (two) times daily.    . Multiple Vitamin (MULTIVITAMIN WITH MINERALS) TABS tablet Take 1 tablet by mouth daily.    . nitroGLYCERIN (NITROSTAT) 0.4 MG SL tablet Place 1 tablet (0.4 mg total) under the tongue every 5  (five) minutes x 3 doses as needed for chest pain. 25 tablet 11  . ofloxacin (OCUFLOX) 0.3 % ophthalmic solution     . prednisoLONE acetate (PRED FORTE) 1 % ophthalmic suspension     . rosuvastatin (CRESTOR) 10 MG tablet Take 1 tablet (10 mg total) by mouth daily at 6 PM. 90 tablet 0  . sildenafil (VIAGRA) 100 MG tablet Take 100 mg by mouth daily as needed for erectile dysfunction.    . ticagrelor (BRILINTA) 90 MG TABS tablet Take 1 tablet (90 mg total) by mouth 2 (two) times daily. 60 tablet 11  . [DISCONTINUED] levofloxacin (LEVAQUIN) 750 MG tablet Take 1 tablet (750 mg total) by mouth daily. For 14 days 14 tablet 0   No current facility-administered medications on file prior to visit.     Allergies  Allergen Reactions  . Codeine Other (See Comments)    Heart attack.     Objective: There were no vitals filed for this visit.  Adam Ray is a pleasant 64 y.o. male WD, WN in NAD.[name] AAO X 3.  Vascular Examination: Capillary fill time to digits <3 seconds left lower extremity. Faintly palpable DP pulse(s) left lower extremity. Nonpalpable PT pulse(s) left lower extremity. Pedal hair absent. Lower extremity skin  temperature gradient within normal limits. No pain with calf compression LLE. No edema noted left lower extremity. No ischemia or gangrene noted left lower extremity.  Dermatological Examination: Pedal skin with normal turgor, texture and tone bilaterally. No open wounds bilaterally. No interdigital macerations bilaterally. Toenails 1-5 left elongated, discolored, dystrophic, thickened, and crumbly with subungual debris and tenderness to dorsal palpation.   Wound Location: L hallux There is a minimal amount of devitalized tissue present in the wound. Wound Measurement:  1.0  x 1.0  cm.. Wound Base: Mixed Granular/Fibrotic Peri-wound: Macerated Exudate: None: wound tissue dry Blood Loss during debridement: N/A. Lesion not debrided on today. Sign(s) of clinical bacterial  infection: no clinical signs of infection noted on examination today.   Musculoskeletal Examination: Normal muscle strength 5/5 to all lower extremity muscle groups LLE. [name] No pain crepitus or joint limitation noted with ROM LLE. Hallux valgus with bunion deformity noted left lower extremity. Lower extremity amputation(s): below knee amputation right lower extremity.  Neurological Examination: Protective sensation diminished with 10g monofilament b/l.  Xray findings left foot: no gas in tissues, laterally deviated hallux with medial deviation of 1st metatarsal left foot and no bone erosion noted at location of ulceration  Assessment and Plan 1. Pain due to onychomycosis of toenail of left foot  2. Diabetic ulcer of left great toe ([place]) - DG Foot Complete Left; Future  3. Type II diabetes mellitus with peripheral circulatory disorder (HCC)  4. Below knee amputation ([place]) -Patient was evaluated and treated and all questions answered.  -Patient/POA/Family member educated on diagnosis and treatment plan of routine ulcer debridement/wound care.  -Ulceration not debrided today. Wound cleansed with Wound Cleanser and band-aid applied. Dispensed postop shoe for left foot. -Patient risk factors affecting healing of ulcer: uncontrolled diabetes, diabetic neuropathy, PAD, history of prior amputation, foot deformity -Toenails 1-5 left debrided in length and girth without iatrogenic bleeding with sterile nail nipper and dremel.  -Patient to report any pedal injuries to medical professional immediately. -Left hallux wound cleansed with wound cleanser. Band-aid applied. Wife instructed to keep it clean and dry. -Patient to continue soft, supportive shoe gear daily. -Patient/POA to call should there be question/concern in the interim.  Return in about 6 days (around 05/02/2020), or see Dr March Rummage on Tuesday 05/02/20, for diabetic ulcer.  Marzetta Board, DPM

## 2020-05-01 ENCOUNTER — Telehealth: Payer: Self-pay | Admitting: Internal Medicine

## 2020-05-01 NOTE — Telephone Encounter (Signed)
Pt states that he thinks his prostate is enlarged. Discussed with pts wife that for prostate issues he needs to contact his PCP or Urology. She verbalized understanding.

## 2020-05-01 NOTE — Telephone Encounter (Signed)
Pt's spouse is requesting a call back to discuss moving the recall colonoscopy sooner since pt states his prostate is enlarging.

## 2020-05-02 ENCOUNTER — Encounter: Payer: Self-pay | Admitting: Podiatry

## 2020-05-02 ENCOUNTER — Other Ambulatory Visit: Payer: Self-pay

## 2020-05-02 ENCOUNTER — Ambulatory Visit (INDEPENDENT_AMBULATORY_CARE_PROVIDER_SITE_OTHER): Payer: No Typology Code available for payment source | Admitting: Podiatry

## 2020-05-02 DIAGNOSIS — S88119A Complete traumatic amputation at level between knee and ankle, unspecified lower leg, initial encounter: Secondary | ICD-10-CM

## 2020-05-02 DIAGNOSIS — L97529 Non-pressure chronic ulcer of other part of left foot with unspecified severity: Secondary | ICD-10-CM | POA: Diagnosis not present

## 2020-05-02 DIAGNOSIS — I739 Peripheral vascular disease, unspecified: Secondary | ICD-10-CM

## 2020-05-02 DIAGNOSIS — E11621 Type 2 diabetes mellitus with foot ulcer: Secondary | ICD-10-CM

## 2020-05-02 MED ORDER — SILVER SULFADIAZINE 1 % EX CREA: TOPICAL_CREAM | CUTANEOUS | 0 refills | Status: DC

## 2020-05-02 NOTE — Progress Notes (Signed)
  Subjective:  Patient ID: Adam Ray, male    DOB: [birth year],  MRN: [id number]  Chief Complaint  Patient presents with  . Wound Check    Left 1st wound check, pt denies fever/nausea/vomiting/chills, but does state he has drainage.    64 y.o. male presents for wound care. Hx confirmed with patient.  Objective:  Physical Exam: Wound Location: Left hallux Wound Measurement: 1x1.5  Wound Base: Mixed Granular/Fibrotic Peri-wound: Macerated Exudate: Scant/small amount Serosanguinous exudate wound without warmth, erythema, signs of acute infection DP pulse palpable, PT non-palp Pitting edema LLE  Hx of R BKA  No images are attached to the encounter.  Radiographs:  Prior X-ray of the left foot: no soft tissue emphysema, no signs of osteomyelitis, sever hallux valgus Assessment:   1. Diabetic ulcer of left great toe ([place])   2. PAD (peripheral artery disease) ([place])   3. Below knee amputation ([place])      Plan:  Patient was evaluated and treated and all questions answered.  Ulcer Right hallux -Prior XR reviewed with patient. No signs of OM. -Dressing applied consisting of silvadene and band-aid -Offload ulcer with surgical shoe and tubefoam bunion shield. 2x fabricated today for patient. -Wound cleansed and debrided -Rx silvadene -Order Vascular arterial studies  Procedure: Excisional Debridement of Wound Indication: Removal of non-viable soft tissue from the wound to promote healing.  Anesthesia: none Pre-Debridement Wound Measurements: 0.5 cm x 0.5 cm x 0.2 cm  Post-Debridement Wound Measurements: 1 cm x 1.5 cm x 0.2 cm  Type of Debridement: Sharp Excisional Tissue Removed: Non-viable soft tissue Instrumentation: 15 blade and tissue nipper Depth of Debridement: subcutaneous tissue. Technique: Sharp excisional debridement to bleeding, viable wound base.  Dressing: Dry, sterile, compression dressing. Disposition: Patient tolerated procedure well. Patient to return in 1  week for follow-up.    Return in about 3 weeks (around 05/23/2020) for Wound Care, Left.

## 2020-05-08 ENCOUNTER — Encounter: Payer: Self-pay | Admitting: Student in an Organized Health Care Education/Training Program

## 2020-05-08 ENCOUNTER — Ambulatory Visit (INDEPENDENT_AMBULATORY_CARE_PROVIDER_SITE_OTHER)
Payer: No Typology Code available for payment source | Admitting: Student in an Organized Health Care Education/Training Program

## 2020-05-08 ENCOUNTER — Other Ambulatory Visit: Payer: Self-pay

## 2020-05-08 VITALS — BP 92/62 | HR 58

## 2020-05-08 DIAGNOSIS — R350 Frequency of micturition: Secondary | ICD-10-CM

## 2020-05-08 DIAGNOSIS — Z794 Long term (current) use of insulin: Secondary | ICD-10-CM

## 2020-05-08 DIAGNOSIS — E1141 Type 2 diabetes mellitus with diabetic mononeuropathy: Secondary | ICD-10-CM

## 2020-05-08 DIAGNOSIS — Z125 Encounter for screening for malignant neoplasm of prostate: Secondary | ICD-10-CM

## 2020-05-08 LAB — POCT GLYCOSYLATED HEMOGLOBIN (HGB A1C): Hemoglobin A1C: 11.5 % — AB (ref 4.0–5.6)

## 2020-05-08 LAB — POCT URINALYSIS DIP (MANUAL ENTRY)
Bilirubin, UA: NEGATIVE
Glucose, UA: >=1000 mg/dL — AB
Ketones, POC UA: NEGATIVE mg/dL
Leukocytes, UA: NEGATIVE
Nitrite, UA: NEGATIVE
Spec Grav, UA: 1.02 (ref 1.010–1.025)
Urobilinogen, UA: 0.2 E.U./dL
pH, UA: 6 (ref 5.0–8.0)

## 2020-05-08 LAB — POCT UA - MICROSCOPIC ONLY

## 2020-05-08 NOTE — Progress Notes (Signed)
    SUBJECTIVE:   CHIEF COMPLAINT / HPI: increased urination frequency  Increased urination- about once per hour. Sometimes feels like he does not fully empty his bladder. Has a strong urine stream. No pain with urination. Wife attests to occasional blood in urine. Volumes vary between just a small amount or a large amount. Has urinary incontinence 2-3x/day. Endorses leakage and also incontinence related to not being able to get to the restroom in time.  Takes Lasix- 40mg  BID. Does not see an increase in urine with this.  Denies h/o prostate problems and no incidences of retention.   DM- does not regularly monitor BS at home. Endorses adherence with medications.   OBJECTIVE:   BP 92/62   Pulse (!) 58   SpO2 97%   General: NAD, pleasant, able to participate in exam Cardiac: RRR, normal heart sounds, no murmurs. 2+ radial and PT pulses bilaterally Respiratory: CTAB, normal effort, No wheezes, rales or rhonchi Abdomen: soft, nontender, nondistended, no hepatic or splenomegaly, +BS Skin: warm and dry, no rashes noted Neuro: alert and oriented Psych: Normal affect and mood Prostate: enlarged, smooth and symmetrical. Normal rectal tone.  ASSESSMENT/PLAN:   Increased urinary frequency Mixed picture with description of symptoms.  Performing prostate exam and urinalysis today. Also checking diabetes as hyperglycemia could be contributing. - prostate exam revealed enlarged, smooth and symmetrical gland which could be contributing to symptoms - urinalysis to r/o infection - BMP, hgb A1c, urine to monitor DM.  - also taking lasix which will contribute but dose has not recently changed and patient denies increased UOP with doses so would consider stopping this medication   Update: patient has very poorly controlled DM with >1,000 glucose in urine and A1c increase from 8.7 to 11.5 today. Anion gap 12. Signs of dehydration. Microscopic urine neg for abnormal blood/infection. Kidney function has  declined since Aug or could be AKI. Called patient to confirm his insulin regimen and there is a lot of confusion. He states he takes 100mg  of insulin BID and maybe called novolog. Does not sounds like he is taking any meal coverage.   Scheduled Friday with Dr. Pilar Plate: -I asked the patient to come back in and bring ALL of his medications with him including insulin to be able to change his regimen and get him back on track. This is likely the biggest contribution to his current urinary symptoms. -I would recommend good glucose control, discontinue lasix, and then if still having symptoms, treating BPH. Low concern for prostatic cancer with exam and low PSA. - please recheck BMP for GFR monitoring  Richarda Osmond, [place]

## 2020-05-08 NOTE — Patient Instructions (Signed)
It was a pleasure to see you today!  To summarize our discussion for this visit:    We are also checking other things that could contribute to frequent urination like infection, diabetes, and prostate issues. We checked your prostate which is enlarged but smooth and symmetrical.   Call the clinic at [phone] if your symptoms worsen or you have any concerns.   Thank you for allowing me to take part in your care,  Dr. Doristine Mango   Benign Prostatic Hyperplasia  Benign prostatic hyperplasia (BPH) is an enlarged prostate gland that is caused by the normal aging process and not by cancer. The prostate is a walnut-sized gland that is involved in the production of semen. It is located in front of the rectum and below the bladder. The bladder stores urine and the urethra is the tube that carries the urine out of the body. The prostate may get bigger as a man gets older. An enlarged prostate can press on the urethra. This can make it harder to pass urine. The build-up of urine in the bladder can cause infection. Back pressure and infection may progress to bladder damage and kidney (renal) failure. What are the causes? This condition is part of a normal aging process. However, not all men develop problems from this condition. If the prostate enlarges away from the urethra, urine flow will not be blocked. If it enlarges toward the urethra and compresses it, there will be problems passing urine. What increases the risk? This condition is more likely to develop in men over the age of 69 years. What are the signs or symptoms? Symptoms of this condition include:  Getting up often during the night to urinate.  Needing to urinate frequently during the day.  Difficulty starting urine flow.  Decrease in size and strength of your urine stream.  Leaking (dribbling) after urinating.  Inability to pass urine. This needs immediate treatment.  Inability to completely empty your  bladder.  Pain when you pass urine. This is more common if there is also an infection.  Urinary tract infection (UTI). How is this diagnosed? This condition is diagnosed based on your medical history, a physical exam, and your symptoms. Tests will also be done, such as:  A post-void bladder scan. This measures any amount of urine that may remain in your bladder after you finish urinating.  A digital rectal exam. In a rectal exam, your health care provider checks your prostate by putting a lubricated, gloved finger into your rectum to feel the back of your prostate gland. This exam detects the size of your gland and any abnormal lumps or growths.  An exam of your urine (urinalysis).  A prostate specific antigen (PSA) screening. This is a blood test used to screen for prostate cancer.  An ultrasound. This test uses sound waves to electronically produce a picture of your prostate gland. Your health care provider may refer you to a specialist in kidney and prostate diseases (urologist). How is this treated? Once symptoms begin, your health care provider will monitor your condition (active surveillance or watchful waiting). Treatment for this condition will depend on the severity of your condition. Treatment may include:  Observation and yearly exams. This may be the only treatment needed if your condition and symptoms are mild.  Medicines to relieve your symptoms, including: ? Medicines to shrink the prostate. ? Medicines to relax the muscle of the prostate.  Surgery in severe cases. Surgery may include: ? Prostatectomy. In this procedure, the prostate  tissue is removed completely through an open incision or with a laparoscope or robotics. ? Transurethral resection of the prostate (TURP). In this procedure, a tool is inserted through the opening at the tip of the penis (urethra). It is used to cut away tissue of the inner core of the prostate. The pieces are removed through the same opening  of the penis. This removes the blockage. ? Transurethral incision (TUIP). In this procedure, small cuts are made in the prostate. This lessens the prostate's pressure on the urethra. ? Transurethral microwave thermotherapy (TUMT). This procedure uses microwaves to create heat. The heat destroys and removes a small amount of prostate tissue. ? Transurethral needle ablation (TUNA). This procedure uses radio frequencies to destroy and remove a small amount of prostate tissue. ? Interstitial laser coagulation ([place]). This procedure uses a laser to destroy and remove a small amount of prostate tissue. ? Transurethral electrovaporization (TUVP). This procedure uses electrodes to destroy and remove a small amount of prostate tissue. ? Prostatic urethral lift. This procedure inserts an implant to push the lobes of the prostate away from the urethra. Follow these instructions at home:  Take over-the-counter and prescription medicines only as told by your health care provider.  Monitor your symptoms for any changes. Contact your health care provider with any changes.  Avoid drinking large amounts of liquid before going to bed or out in public.  Avoid or reduce how much caffeine or alcohol you drink.  Give yourself time when you urinate.  Keep all follow-up visits as told by your health care provider. This is important. Contact a health care provider if:  You have unexplained back pain.  Your symptoms do not get better with treatment.  You develop side effects from the medicine you are taking.  Your urine becomes very dark or has a bad smell.  Your lower abdomen becomes distended and you have trouble passing your urine. Get help right away if:  You have a fever or chills.  You suddenly cannot urinate.  You feel lightheaded, or very dizzy, or you faint.  There are large amounts of blood or clots in the urine.  Your urinary problems become hard to manage.  You develop moderate to severe  low back or flank pain. The flank is the side of your body between the ribs and the hip. These symptoms may represent a serious problem that is an emergency. Do not wait to see if the symptoms will go away. Get medical help right away. Call your local emergency services (911 in the [place] Do not drive yourself to the hospital. Summary  Benign prostatic hyperplasia (BPH) is an enlarged prostate that is caused by the normal aging process and not by cancer.  An enlarged prostate can press on the urethra. This can make it hard to pass urine.  This condition is part of a normal aging process and is more likely to develop in men over the age of 10 years.  Get help right away if you suddenly cannot urinate. This information is not intended to replace advice given to you by your health care provider. Make sure you discuss any questions you have with your health care provider. Document Revised: 05/12/2018 Document Reviewed: 07/22/2016 Elsevier Patient Education  2020 [employer].

## 2020-05-09 DIAGNOSIS — R350 Frequency of micturition: Secondary | ICD-10-CM | POA: Insufficient documentation

## 2020-05-09 LAB — BASIC METABOLIC PANEL
BUN/Creatinine Ratio: 15 (ref 10–24)
BUN: 22 mg/dL (ref 8–27)
CO2: 24 mmol/L (ref 20–29)
Calcium: 9.3 mg/dL (ref 8.6–10.2)
Chloride: 96 mmol/L (ref 96–106)
Creatinine, Ser: 1.51 mg/dL — ABNORMAL HIGH (ref 0.76–1.27)
GFR calc Af Amer: 55 mL/min/{1.73_m2} — ABNORMAL LOW (ref >59)
GFR calc non Af Amer: 48 mL/min/{1.73_m2} — ABNORMAL LOW (ref >59)
Glucose: 395 mg/dL — ABNORMAL HIGH (ref 65–99)
Potassium: 4.5 mmol/L (ref 3.5–5.2)
Sodium: 132 mmol/L — ABNORMAL LOW (ref 134–144)

## 2020-05-09 LAB — PSA: Prostate Specific Ag, Serum: 0.1 ng/mL (ref 0.0–4.0)

## 2020-05-09 NOTE — Assessment & Plan Note (Addendum)
Mixed picture with description of symptoms.  Performing prostate exam and urinalysis today. Also checking diabetes as hyperglycemia could be contributing. - prostate exam revealed enlarged, smooth and symmetrical gland which could be contributing to symptoms - urinalysis to r/o infection - BMP, hgb A1c, urine to monitor DM.  - also taking lasix which will contribute but dose has not recently changed and patient denies increased UOP with doses so would consider stopping this medication

## 2020-05-12 ENCOUNTER — Ambulatory Visit (INDEPENDENT_AMBULATORY_CARE_PROVIDER_SITE_OTHER): Payer: No Typology Code available for payment source | Admitting: Family Medicine

## 2020-05-12 ENCOUNTER — Other Ambulatory Visit: Payer: Self-pay

## 2020-05-12 ENCOUNTER — Encounter: Payer: Self-pay | Admitting: Family Medicine

## 2020-05-12 VITALS — BP 110/60 | HR 56 | Ht 70.0 in

## 2020-05-12 DIAGNOSIS — R739 Hyperglycemia, unspecified: Secondary | ICD-10-CM | POA: Diagnosis not present

## 2020-05-12 DIAGNOSIS — Z794 Long term (current) use of insulin: Secondary | ICD-10-CM

## 2020-05-12 DIAGNOSIS — E1165 Type 2 diabetes mellitus with hyperglycemia: Secondary | ICD-10-CM | POA: Diagnosis not present

## 2020-05-12 LAB — GLUCOSE, POCT (MANUAL RESULT ENTRY): POC Glucose: 409 mg/dl — AB (ref 70–99)

## 2020-05-12 NOTE — Patient Instructions (Addendum)
Diabetes: Your diabetes is currently poorly controlled and I think it is causing other symptoms like frequent urination and kidney injury.  I would like you to come to the clinic frequently until we have your diabetes controlled much better.  Today, we will get lab work to make sure that you are not getting worse.  I will call you later today to try to confirm the medication you are taking at home.  I am also going to ask a nurse to go to your home to assess your medication administration to see if you are taking anything differently than we realize.  I do not think that today is a good day to start any new medication. [name] DASH Eating Plan DASH stands for "Dietary Approaches to Stop Hypertension." The DASH eating plan is a healthy eating plan that has been shown to reduce high blood pressure (hypertension). It may also reduce your risk for type 2 diabetes, heart disease, and stroke. The DASH eating plan may also help with weight loss. What are tips for following this plan?  General guidelines  Avoid eating more than 2,300 mg (milligrams) of salt (sodium) a day. If you have hypertension, you may need to reduce your sodium intake to 1,500 mg a day.  Limit alcohol intake to no more than 1 drink a day for nonpregnant women and 2 drinks a day for men. One drink equals 12 oz of beer, 5 oz of wine, or 1 oz of hard liquor.  Work with your health care provider to maintain a healthy body weight or to lose weight. Ask what an ideal weight is for you.  Get at least 30 minutes of exercise that causes your heart to beat faster (aerobic exercise) most days of the week. Activities may include walking, swimming, or biking.  Work with your health care provider or diet and nutrition specialist (dietitian) to adjust your eating plan to your individual calorie needs. Reading food labels   Check food labels for the amount of sodium per serving. Choose foods with less than 5 percent of the Daily Value of sodium.  Generally, foods with less than 300 mg of sodium per serving fit into this eating plan.  To find whole grains, look for the word "whole" as the first word in the ingredient list. Shopping  Buy products labeled as "low-sodium" or "no salt added."  Buy fresh foods. Avoid canned foods and premade or frozen meals. Cooking  Avoid adding salt when cooking. Use salt-free seasonings or herbs instead of table salt or sea salt. Check with your health care provider or pharmacist before using salt substitutes.  Do not fry foods. Cook foods using healthy methods such as baking, boiling, grilling, and broiling instead.  Cook with heart-healthy oils, such as olive, canola, soybean, or sunflower oil. Meal planning  Eat a balanced diet that includes: ? 5 or more servings of fruits and vegetables each day. At each meal, try to fill half of your plate with fruits and vegetables. ? Up to 6-8 servings of whole grains each day. ? Less than 6 oz of lean meat, poultry, or fish each day. A 3-oz serving of meat is about the same size as a deck of cards. One egg equals 1 oz. ? 2 servings of low-fat dairy each day. ? A serving of nuts, seeds, or beans 5 times each week. ? Heart-healthy fats. Healthy fats called Omega-3 fatty acids are found in foods such as flaxseeds and coldwater fish, like sardines, salmon, and mackerel.  Limit how much you eat of the following: ? Canned or prepackaged foods. ? Food that is high in trans fat, such as fried foods. ? Food that is high in saturated fat, such as fatty meat. ? Sweets, desserts, sugary drinks, and other foods with added sugar. ? Full-fat dairy products.  Do not salt foods before eating.  Try to eat at least 2 vegetarian meals each week.  Eat more home-cooked food and less restaurant, buffet, and fast food.  When eating at a restaurant, ask that your food be prepared with less salt or no salt, if possible. What foods are recommended? The items listed may not  be a complete list. Talk with your dietitian about what dietary choices are best for you. Grains Whole-grain or whole-wheat bread. Whole-grain or whole-wheat pasta. Brown rice. [place]. Bulgur. Whole-grain and low-sodium cereals. Pita bread. Low-fat, low-sodium crackers. Whole-wheat flour tortillas. Vegetables Fresh or frozen vegetables (raw, steamed, roasted, or grilled). Low-sodium or reduced-sodium tomato and vegetable juice. Low-sodium or reduced-sodium tomato sauce and tomato paste. Low-sodium or reduced-sodium canned vegetables. Fruits All fresh, dried, or frozen fruit. Canned fruit in natural juice (without added sugar). Meat and other protein foods Skinless chicken or [place]. Ground chicken or [place]. Pork with fat trimmed off. Fish and seafood. Egg whites. Dried beans, peas, or lentils. Unsalted nuts, nut butters, and seeds. Unsalted canned beans. Lean cuts of beef with fat trimmed off. Low-sodium, lean deli meat. Dairy Low-fat (1%) or fat-free (skim) milk. Fat-free, low-fat, or reduced-fat cheeses. Nonfat, low-sodium ricotta or cottage cheese. Low-fat or nonfat yogurt. Low-fat, low-sodium cheese. Fats and oils Soft margarine without trans fats. Vegetable oil. Low-fat, reduced-fat, or light mayonnaise and salad dressings (reduced-sodium). Canola, safflower, olive, soybean, and sunflower oils. Avocado. Seasoning and other foods Herbs. Spices. Seasoning mixes without salt. Unsalted popcorn and pretzels. Fat-free sweets. What foods are not recommended? The items listed may not be a complete list. Talk with your dietitian about what dietary choices are best for you. Grains Baked goods made with fat, such as croissants, muffins, or some breads. Dry pasta or rice meal packs. Vegetables Creamed or fried vegetables. Vegetables in a cheese sauce. Regular canned vegetables (not low-sodium or reduced-sodium). Regular canned tomato sauce and paste (not low-sodium or reduced-sodium). Regular  tomato and vegetable juice (not low-sodium or reduced-sodium). Angie Fava. Olives. Fruits Canned fruit in a light or heavy syrup. Fried fruit. Fruit in cream or butter sauce. Meat and other protein foods Fatty cuts of meat. Ribs. Fried meat. Berniece Salines. Sausage. Bologna and other processed lunch meats. Salami. Fatback. Hotdogs. Bratwurst. Salted nuts and seeds. Canned beans with added salt. Canned or smoked fish. Whole eggs or egg yolks. Chicken or [place] with skin. Dairy Whole or 2% milk, cream, and half-and-half. Whole or full-fat cream cheese. Whole-fat or sweetened yogurt. Full-fat cheese. Nondairy creamers. Whipped toppings. Processed cheese and cheese spreads. Fats and oils Butter. Stick margarine. Lard. Shortening. Ghee. Bacon fat. Tropical oils, such as coconut, palm kernel, or palm oil. Seasoning and other foods Salted popcorn and pretzels. Onion salt, garlic salt, seasoned salt, table salt, and sea salt. Worcestershire sauce. Tartar sauce. Barbecue sauce. Teriyaki sauce. Soy sauce, including reduced-sodium. Steak sauce. Canned and packaged gravies. Fish sauce. Oyster sauce. Cocktail sauce. Horseradish that you find on the shelf. Ketchup. Mustard. Meat flavorings and tenderizers. Bouillon cubes. Hot sauce and Tabasco sauce. Premade or packaged marinades. Premade or packaged taco seasonings. Relishes. Regular salad dressings. Where to find more information:  National Heart, Lung, and Blood  Institute: https://wilson-eaton.com/  American Heart Association: www.heart.org Summary  The DASH eating plan is a healthy eating plan that has been shown to reduce high blood pressure (hypertension). It may also reduce your risk for type 2 diabetes, heart disease, and stroke.  With the DASH eating plan, you should limit salt (sodium) intake to 2,300 mg a day. If you have hypertension, you may need to reduce your sodium intake to 1,500 mg a day.  When on the DASH eating plan, aim to eat more fresh fruits and  vegetables, whole grains, lean proteins, low-fat dairy, and heart-healthy fats.  Work with your health care provider or diet and nutrition specialist (dietitian) to adjust your eating plan to your individual calorie needs. This information is not intended to replace advice given to you by your health care provider. Make sure you discuss any questions you have with your health care provider. Document Revised: 05/30/2017 Document Reviewed: 06/10/2016 Elsevier Patient Education  2020 [employer].

## 2020-05-12 NOTE — Assessment & Plan Note (Signed)
Adam Ray continues to have very elevated blood sugars with a capillary blood glucose over 400 today.  I do not think that he can reliably recount his insulin doses he believes he is taking 400 units of insulin daily.  I am going to call his wife later on today while they are at home to see if I can find out additional information about their insulin while they have their medications in front of them.  In the meantime, I will bring them back to clinic early next week for further discussion about medication management.  He needs additional insulin but is difficult to make changes with an unreliable account of his current insulin dose.  I will also send a home health nurse out to the house for a medication reconciliation visit.  I do not want to start Metformin or other oral agents today based on his acute kidney injury.  On physical exam he is mildly dry all the does not demonstrate evidence of DKA or HHS.

## 2020-05-12 NOTE — Progress Notes (Signed)
    SUBJECTIVE:   CHIEF COMPLAINT / HPI:   Hyperglycemia Mr. Iannelli was seen in clinic last week and labs taken at that time demonstrated hyperglycemia with blood glucose over 400.  Is encouraged to return to clinic with all of his medications.  He is return to clinic today without his medication.  He reports that he is familiar with his medication.  His medication is listed in our system as follows: -Lantus 70 units BID -aspart 40 units TID  He reports that he is taking Lantus 100 units twice daily and aspart 100 units twice daily.  He notes that he has been having excessive urination without excessive thirst.  He specifically denies nausea, vomiting, abdominal pain.  He does not take Metformin because he was told his Metformin at some point had a high concentration of cancer causing chemicals.   PERTINENT  PMH / PSH: Diabetes  OBJECTIVE:   BP 110/60   Pulse (!) 56   Ht 5\' 10"  (1.778 m)   SpO2 97%   BMI 24.39 kg/m    General: Alert and interactive.  No acute distress.  Seated comfortably in his wheelchair.  Right leg BKA. HEENT: Moderately dry mucous membranes. Cardio: Normal S1 and S2, no S3 or S4. Rhythm is regular. No murmurs or rubs.   Pulm: Clear to auscultation bilaterally, no crackles, wheezing, or diminished breath sounds. Normal respiratory effort. Abdomen: Bowel sounds normal.  Nontender to palpation. Extremities: Warm, well-perfused.  Mild skin tenting.   ASSESSMENT/PLAN:   Hyperglycemia Mr. Ryker continues to have very elevated blood sugars with a capillary blood glucose over 400 today.  I do not think that he can reliably recount his insulin doses he believes he is taking 400 units of insulin daily.  I am going to call his wife later on today while they are at home to see if I can find out additional information about their insulin while they have their medications in front of them.  In the meantime, I will bring them back to clinic early next week for further  discussion about medication management.  He needs additional insulin but is difficult to make changes with an unreliable account of his current insulin dose.  I will also send a home health nurse out to the house for a medication reconciliation visit.  I do not want to start Metformin or other oral agents today based on his acute kidney injury.  On physical exam he is mildly dry all the does not demonstrate evidence of DKA or HHS.   Mrs. Hairston was called in the afternoon to follow-up on what medications Mr. Spells takes at home.  She was at home and able to access all of his medicine.  She noted that his vial of Lantus has a dose of 100 units/mL and when he draws it up he draws it up to the syringe marker indicating 100 units.  His NovoLog pen is currently set to 40 units.  He is taken 1 injection of NovoLog today.  They are encouraged to continue taking the insulin as he has been doing and to follow-up in clinic on Monday with all of their medicine.  Matilde Haymaker, MD [place]

## 2020-05-13 LAB — BASIC METABOLIC PANEL
BUN/Creatinine Ratio: 12 (ref 10–24)
BUN: 18 mg/dL (ref 8–27)
CO2: 24 mmol/L (ref 20–29)
Calcium: 9.3 mg/dL (ref 8.6–10.2)
Chloride: 98 mmol/L (ref 96–106)
Creatinine, Ser: 1.55 mg/dL — ABNORMAL HIGH (ref 0.76–1.27)
GFR calc Af Amer: 54 mL/min/{1.73_m2} — ABNORMAL LOW (ref >59)
GFR calc non Af Amer: 46 mL/min/{1.73_m2} — ABNORMAL LOW (ref >59)
Glucose: 385 mg/dL — ABNORMAL HIGH (ref 65–99)
Potassium: 5.1 mmol/L (ref 3.5–5.2)
Sodium: 135 mmol/L (ref 134–144)

## 2020-05-15 ENCOUNTER — Ambulatory Visit (INDEPENDENT_AMBULATORY_CARE_PROVIDER_SITE_OTHER): Payer: No Typology Code available for payment source | Admitting: Family Medicine

## 2020-05-15 ENCOUNTER — Other Ambulatory Visit: Payer: Self-pay

## 2020-05-15 VITALS — BP 100/60 | HR 59 | Ht 70.0 in

## 2020-05-15 DIAGNOSIS — E1141 Type 2 diabetes mellitus with diabetic mononeuropathy: Secondary | ICD-10-CM | POA: Diagnosis not present

## 2020-05-15 DIAGNOSIS — Z794 Long term (current) use of insulin: Secondary | ICD-10-CM

## 2020-05-15 DIAGNOSIS — N179 Acute kidney failure, unspecified: Secondary | ICD-10-CM | POA: Diagnosis not present

## 2020-05-15 DIAGNOSIS — R739 Hyperglycemia, unspecified: Secondary | ICD-10-CM

## 2020-05-15 LAB — GLUCOSE, POCT (MANUAL RESULT ENTRY): POC Glucose: 67 mg/dl — AB (ref 70–99)

## 2020-05-15 MED ORDER — ROSUVASTATIN CALCIUM 40 MG PO TABS: 40.0000 mg | ORAL_TABLET | Freq: Every day | ORAL | Status: AC

## 2020-05-15 NOTE — Assessment & Plan Note (Signed)
-  Follow-up BMP

## 2020-05-15 NOTE — Assessment & Plan Note (Signed)
Significantly improved compared to last visit.  Based on the log is wife brought, he appears to have his blood sugars ranging between 59 and 150 when he takes his full doses of insulin.  At this point, I am more concerned about episodes of hypoglycemia.  I have instructed Adam Ray and his wife to decrease his short acting insulin to 30 units 3 times daily.  Additionally, he should not receive any short acting insulin as his blood sugar is below 120 prior to a meal.  They are informed that we should shoot for a blood sugar goal range of 100-150.  I would like to restart Metformin but I would like to ensure that his AKI has resolved.  I will follow-up his blood work today and consider restarting Metformin at the follow-up visit this coming Friday.

## 2020-05-15 NOTE — Progress Notes (Signed)
    SUBJECTIVE:   CHIEF COMPLAINT / HPI:   Hyperglycemia follow-up At our last visit, there was uncertainty about his dose of insulin.  After long discussion, his wife agreed to accompany his insulin administration and document the insulin doses.  They return to clinic today with very helpful documentation of the insulin administration since Friday.  His wife is meticulously noted his capillary blood glucoses and insulin ministration since Friday.  Since Friday, he has been receiving the following quantities of insulin: -Lantus 70 units twice daily -Aspart 40 units with meals His average blood sugars seem to be 120-150 fasting in the morning and as low as 59 prior to meals.  He reports that he does sometimes feel like he is having low blood sugar although does not seem to be communicating this to his wife at all.  He reports that he has been having less frequent urination since we started being more meticulous about his insulin usage.  PERTINENT  PMH / PSH: Diabetes, CAD, sickle cell trait  OBJECTIVE:   BP 100/60   Pulse (!) 59   Ht 5\' 10"  (1.778 m)   SpO2 98%   BMI 24.39 kg/m    General: Alert and cooperative and appears to be in no acute distress.  Responsive to questioning although his wife provides the majority of the information for the visit. HEENT: Moist mucous membranes Cardio: Normal S1 and S2, no S3 or S4. Rhythm is regular. No murmurs or rubs.   Pulm: Clear to auscultation bilaterally, no crackles, wheezing, or diminished breath sounds. Normal respiratory effort Abdomen: Bowel sounds normal. Abdomen soft and non-tender.  Extremities: No peripheral edema. Warm/ well perfused.  Strong radial pulse.   ASSESSMENT/PLAN:   Hyperglycemia Significantly improved compared to last visit.  Based on the log is wife brought, he appears to have his blood sugars ranging between 59 and 150 when he takes his full doses of insulin.  At this point, I am more concerned about episodes of  hypoglycemia.  I have instructed Mr. Parenteau and his wife to decrease his short acting insulin to 30 units 3 times daily.  Additionally, he should not receive any short acting insulin as his blood sugar is below 120 prior to a meal.  They are informed that we should shoot for a blood sugar goal range of 100-150.  I would like to restart Metformin but I would like to ensure that his AKI has resolved.  I will follow-up his blood work today and consider restarting Metformin at the follow-up visit this coming Friday.  AKI (acute kidney injury) ([place]) -Follow-up BMP     Matilde Haymaker, MD [place]

## 2020-05-15 NOTE — Patient Instructions (Signed)
Diabetes: I am glad that his blood sugar numbers are so much better than they were before.  You guys are doing a great job giving him his insulin.  I would like to shoot for a blood sugar goal of 100-150.  Reduce the short acting (NovoLog) insulin to 30 units with each meal.  If his blood sugar is 120 or less, do not give him any short acting insulin.  Continue giving 70 units of his long-acting insulin twice a day.  Please continue keeping good records of your insulin administration.  I will see you this Friday and will continue to keep an eye on his blood sugar.

## 2020-05-16 LAB — BASIC METABOLIC PANEL
BUN/Creatinine Ratio: 12 (ref 10–24)
BUN: 16 mg/dL (ref 8–27)
CO2: 24 mmol/L (ref 20–29)
Calcium: 9.7 mg/dL (ref 8.6–10.2)
Chloride: 101 mmol/L (ref 96–106)
Creatinine, Ser: 1.33 mg/dL — ABNORMAL HIGH (ref 0.76–1.27)
GFR calc Af Amer: 64 mL/min/{1.73_m2} (ref >59)
GFR calc non Af Amer: 56 mL/min/{1.73_m2} — ABNORMAL LOW (ref >59)
Glucose: 109 mg/dL — ABNORMAL HIGH (ref 65–99)
Potassium: 4.2 mmol/L (ref 3.5–5.2)
Sodium: 140 mmol/L (ref 134–144)

## 2020-05-18 ENCOUNTER — Ambulatory Visit (INDEPENDENT_AMBULATORY_CARE_PROVIDER_SITE_OTHER): Payer: No Typology Code available for payment source

## 2020-05-18 ENCOUNTER — Encounter: Payer: Self-pay | Admitting: Physician Assistant

## 2020-05-18 ENCOUNTER — Ambulatory Visit (INDEPENDENT_AMBULATORY_CARE_PROVIDER_SITE_OTHER): Payer: No Typology Code available for payment source | Admitting: Physician Assistant

## 2020-05-18 DIAGNOSIS — M79672 Pain in left foot: Secondary | ICD-10-CM

## 2020-05-18 MED ORDER — DOXYCYCLINE HYCLATE 100 MG PO TABS: 100.0000 mg | ORAL_TABLET | Freq: Two times a day (BID) | ORAL | 0 refills | Status: DC

## 2020-05-18 NOTE — Progress Notes (Signed)
Office Visit Note   Patient: Adam Ray           Date of Birth: 10-01-1955           MRN: [id number] Visit Date: 05/18/2020              Requested by: [name], [address],  [place] [address] PCP: [name], MD  Chief Complaint  Patient presents with  . Right Knee - Follow-up      HPI: Patient presents today he is status post right below-knee amputation.  Wound culture is doing quite well.  He is in the process of working on getting a prosthetic.  His wife is concerned about his left foot and his left leg swelling.  He denies any calf pain.  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 Pain in left foot     Plan: Patient will be placed in a Profore dressing today.  Follow-up in 1 week.  Have also given him some doxycycline.  Follow-Up Instructions: No follow-ups on file.   Ortho Exam  Patient is alert, oriented, no adenopathy, well-dressed, normal affect, normal respiratory effort.  Right below-knee amputation stump is healing well.  Completely healed wound.  On the left he has 1/2 inch ulcer on the inside of his toe.  He has palpable dorsalis pedis pulses.  No cellulitis no skin breakdown.  Except for the ulcer.  He has pitting edema from his foot up into his knee and his sock is very tight on him.  Compartments are compressible without pain.  Imaging: No results found. No images are attached to the encounter.  Labs: Lab Results  Component Value Date   HGBA1C 11.5 (A) 05/08/2020   HGBA1C 8.7 (H) 01/19/2020   HGBA1C 7.2 (A) 10/28/2019   ESRSEDRATE 54 (H) 01/01/2020   ESRSEDRATE 8 01/29/2016   ESRSEDRATE 9 04/11/2012   CRP 2.6 (H) 01/29/2016   CRP 0.5 02/28/2014   REPTSTATUS 01/18/2020 FINAL 01/17/2020   REPTSTATUS 01/22/2020 FINAL 01/17/2020   REPTSTATUS 01/22/2020 FINAL 01/17/2020   GRAMSTAIN  04/11/2012    NO WBC SEEN RARE SQUAMOUS EPITHELIAL CELLS PRESENT MODERATE GRAM  POSITIVE COCCI IN PAIRS RARE GRAM NEGATIVE RODS   GRAMSTAIN  04/11/2012    NO WBC SEEN RARE SQUAMOUS EPITHELIAL CELLS PRESENT MODERATE GRAM POSITIVE COCCI IN PAIRS RARE GRAM NEGATIVE RODS   CULT (A) 01/17/2020    <10,000 COLONIES/mL INSIGNIFICANT GROWTH Performed at [facility], [address], [place], [place] [address]    CULT  01/17/2020    NO GROWTH 5 DAYS Performed at [facility] [address], [place], [place] [address]    CULT  01/17/2020    NO GROWTH 5 DAYS Performed at [facility] [address], [place], [place] [address]    LABORGA NO GROWTH 2 DAYS 07/19/2014     Lab Results  Component Value Date   ALBUMIN 3.7 02/23/2020   ALBUMIN 3.0 (L) 01/18/2020   ALBUMIN 3.4 (L) 01/17/2020    No results found for: MG No results found for: VD25OH  No results found for: PREALBUMIN CBC EXTENDED Latest Ref Rng & Units 02/23/2020 01/27/2020 01/19/2020  WBC 4.0 - 10.5 K/uL 3.9(L) 3.1(L) 3.2(L)  RBC 4.22 - 5.81 MIL/uL 3.97(L) 3.52(L) 3.42(L)  HGB 13.0 - 17.0 g/dL 11.6(L) 10.5(L) 9.9(L)  HCT 39 - 52 % 35.9(L) 31.2(L) 30.4(L)  PLT 150 - 400  K/uL 178 132(L) 167  NEUTROABS 1.7 - 7.7 K/uL - - -  LYMPHSABS 0.7 - 4.0 K/uL - - -     There is no height or weight on file to calculate BMI.  Orders:  Orders Placed This Encounter  Procedures  . XR Foot 2 Views Left   Meds ordered this encounter  Medications  . doxycycline (VIBRA-TABS) 100 MG tablet    Sig: Take 1 tablet (100 mg total) by mouth 2 (two) times daily.    Dispense:  60 tablet    Refill:  0     Procedures: No procedures performed  Clinical Data: No additional findings.  ROS:  All other systems negative, except as noted in the HPI. Review of Systems  Objective: Vital Signs: There were no vitals taken for this visit.  Specialty Comments:  No specialty comments available.  PMFS History: Patient Active Problem List   Diagnosis Date Noted  . Hyperglycemia 05/12/2020  . Increased  urinary frequency 05/09/2020  . Dehiscence of amputation stump ([place])   . Coronary artery disease 01/18/2020  . AKI (acute kidney injury) ([place]) 01/17/2020  . Nocturnal leg cramps 10/28/2019  . Hyperlipidemia 05/26/2019  . Edema leg 12/29/2018  . STEMI involving left circumflex coronary artery ([place]) 07/12/2018  . Status post transmetatarsal amputation of foot, right ([place]) 07/08/2018  . Limited mobility 05/12/2018  . Gingival foreign body 07/22/2017  . Anxiety state 06/02/2017  . Leg swelling 01/29/2017  . Idiopathic chronic venous hypertension of both lower extremities with inflammation 09/24/2016  . Testicular mass 04/18/2016  . Subacute osteomyelitis, right ankle and foot ([place]) 01/29/2016  . Diabetic polyneuropathy associated with type 2 diabetes mellitus ([place])   . Type 2 diabetes mellitus with neurologic complication, with long-term current use of insulin ([place]) 12/08/2015  . Pulmonary nodule 02/01/2015  . Depression 12/09/2013  . Warts, genital 08/20/2013  . Diabetic retinopathy ([place]) 01/28/2013  . Sleep apnea 09/06/2010  . 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 Essential hypertension 11/09/2008  . COPD, mild ([place]) 10/06/2006  . SICKLE-CELL TRAIT 04/26/2006  . ERECTILE DYSFUNCTION 04/26/2006  . Tobacco abuse 04/26/2006  . PAD (peripheral artery disease) ([place]) 04/26/2006  . ALLERGIC RHINITIS 04/26/2006   Past Medical History:  Diagnosis Date  . Allergy   . Arthritis   . Bronchitis   . Cataract   . CHF (congestive heart failure) ([place])   . Chronic kidney disease   . Claudication [facility])    right foot ray resection  . Colon polyps    hyperplastic  . COPD (chronic obstructive pulmonary disease) ([place])   . Coronary artery disease   . Diabetes mellitus    type II  . Genital warts   . Gout   . Hyperlipidemia   . Hypertension   . Myocardial infarction ([place])   . Osteomyelitis of third toe of right foot ([place])   . Pneumonia   . Status post amputation of toe of right foot ([place])  09/24/2016    Family History  Problem Relation Age of Onset  . Diabetes Mother   . Stroke Mother   . Heart failure Father   . Colon cancer Neg Hx   . Esophageal cancer Neg Hx   . Rectal cancer Neg Hx   . Stomach cancer Neg Hx     Past Surgical History:  Procedure Laterality Date  . AMPUTATION Right 01/31/2016   Procedure: Right 2nd Toe Amputation;  Surgeon: Newt Minion, MD;  Location: [facility];  Service: Orthopedics;  Laterality:  Right;  . AMPUTATION Right 07/08/2018   Procedure: RIGHT TRANSMETATARSAL AMPUTATION;  Surgeon: Newt Minion, MD;  Location: [facility];  Service: Orthopedics;  Laterality: Right;  . AMPUTATION Right 01/05/2020   Procedure: RIGHT BELOW KNEE AMPUTATION;  Surgeon: Newt Minion, MD;  Location: [facility];  Service: Orthopedics;  Laterality: Right;  . AMPUTATION Right 02/23/2020   Procedure: RIGHT BELOW KNEE AMPUTATION REVISION;  Surgeon: Newt Minion, MD;  Location: [facility];  Service: Orthopedics;  Laterality: Right;  . CATARACT EXTRACTION     right eye  . COLONOSCOPY    . CORONARY STENT INTERVENTION N/A 07/12/2018   Procedure: CORONARY STENT INTERVENTION;  Surgeon: Troy Sine, MD;  Location: [facility];  Service: Cardiovascular;  Laterality: N/A;  . CORONARY/GRAFT ACUTE MI REVASCULARIZATION N/A 07/12/2018   Procedure: Coronary/Graft Acute MI Revascularization;  Surgeon: Troy Sine, MD;  Location: [facility];  Service: Cardiovascular;  Laterality: N/A;  . I & D EXTREMITY  04/11/2012   Procedure: IRRIGATION AND DEBRIDEMENT EXTREMITY;  Surgeon: Wylene Simmer, MD;  Location: [facility];  Service: Orthopedics;  Laterality: Right;  . LEFT HEART CATH AND CORONARY ANGIOGRAPHY N/A 07/12/2018   Procedure: LEFT HEART CATH AND CORONARY ANGIOGRAPHY;  Surgeon: Troy Sine, MD;  Location: [facility];  Service: Cardiovascular;  Laterality: N/A;  . Surgery left great toe    . Tear ducts bilateral eyes    . TRANSMETATARSAL AMPUTATION Right 07/08/2018   Social  History   Occupational History  . Occupation: disabled  Tobacco Use  . Smoking status: Former Smoker    Packs/day: 0.30    Years: 48.00    Pack years: 14.40    Types: Cigars, Cigarettes    Start date: 07/02/1963    Quit date: 06/01/2019    Years since quitting: 0.9  . Smokeless tobacco: Former Network engineer  . Vaping Use: Former  Substance and Sexual Activity  . Alcohol use: Not Currently    Alcohol/week: 0.0 standard drinks  . Drug use: No  . Sexual activity: Not on file

## 2020-05-19 ENCOUNTER — Telehealth: Payer: Self-pay

## 2020-05-19 ENCOUNTER — Ambulatory Visit: Payer: No Typology Code available for payment source | Admitting: Family Medicine

## 2020-05-19 ENCOUNTER — Ambulatory Visit (INDEPENDENT_AMBULATORY_CARE_PROVIDER_SITE_OTHER): Payer: No Typology Code available for payment source | Admitting: Podiatry

## 2020-05-19 DIAGNOSIS — Z5329 Procedure and treatment not carried out because of patient's decision for other reasons: Secondary | ICD-10-CM

## 2020-05-19 NOTE — Progress Notes (Signed)
No show for appt. 

## 2020-05-19 NOTE — Telephone Encounter (Signed)
Sharyn Lull, [facility] Nurse, calls nurse line requesting verbal orders for home health nursing as follows:   1 time(s) weekly for 9 week(s)  Verbal orders given per [facility] protocol.

## 2020-05-23 ENCOUNTER — Ambulatory Visit: Payer: No Typology Code available for payment source | Admitting: Family Medicine

## 2020-05-24 ENCOUNTER — Ambulatory Visit: Payer: No Typology Code available for payment source | Admitting: Podiatry

## 2020-05-29 ENCOUNTER — Ambulatory Visit (INDEPENDENT_AMBULATORY_CARE_PROVIDER_SITE_OTHER): Payer: No Typology Code available for payment source | Admitting: Orthopedic Surgery

## 2020-05-29 ENCOUNTER — Encounter: Payer: Self-pay | Admitting: Orthopedic Surgery

## 2020-05-29 VITALS — Ht 70.0 in | Wt 170.0 lb

## 2020-05-29 DIAGNOSIS — Z89511 Acquired absence of right leg below knee: Secondary | ICD-10-CM

## 2020-05-29 DIAGNOSIS — I87323 Chronic venous hypertension (idiopathic) with inflammation of bilateral lower extremity: Secondary | ICD-10-CM

## 2020-05-29 NOTE — Progress Notes (Signed)
Office Visit Note   Patient: Adam Ray           Date of Birth: [birth year]           MRN: [id number] Visit Date: 05/29/2020              Requested by: [name], [address],  [place] [address] PCP: [name], MD  Chief Complaint  Patient presents with  . Right Leg - Routine Post Op    02/23/20 right BKA   . Left Leg - Follow-up      HPI: Patient is a 64 year old gentleman status post right transtibial amputation 3 months ago.  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  Assessment & Plan: Visit Diagnoses:  1. Acquired absence of right leg below knee ([place])   2. Idiopathic chronic venous hypertension of both lower extremities with inflammation     Plan: Recommended a knee-high compression stocking for the left lower extremity size large.  Follow-Up Instructions: Return in about 4 weeks (around 06/26/2020).   Ortho Exam  Patient is alert, oriented, no adenopathy, well-dressed, normal affect, normal respiratory effort. Examination patient had rolled up the distal aspect of the compression wrap causing swelling in the forefoot.  The remainder of the left lower extremity has good wrinkling skin from the compression.  There is no ulceration or draining in the leg.  He does have a pressure abrasion over the medial left great toe this is partial thickness skin breakdown.  Band-Aid was applied.  Imaging: No results found. No images are attached to the encounter.  Labs: Lab Results  Component Value Date   HGBA1C 11.5 (A) 05/08/2020   HGBA1C 8.7 (H) 01/19/2020   HGBA1C 7.2 (A) 10/28/2019   ESRSEDRATE 54 (H) 01/01/2020   ESRSEDRATE 8 01/29/2016   ESRSEDRATE 9 04/11/2012   CRP 2.6 (H) 01/29/2016   CRP 0.5 02/28/2014   REPTSTATUS 01/18/2020 FINAL 01/17/2020   REPTSTATUS 01/22/2020 FINAL 01/17/2020   REPTSTATUS 01/22/2020 FINAL 01/17/2020   GRAMSTAIN  04/11/2012    NO WBC SEEN RARE SQUAMOUS EPITHELIAL CELLS  PRESENT MODERATE GRAM POSITIVE COCCI IN PAIRS RARE GRAM NEGATIVE RODS   GRAMSTAIN  04/11/2012    NO WBC SEEN RARE SQUAMOUS EPITHELIAL CELLS PRESENT MODERATE GRAM POSITIVE COCCI IN PAIRS RARE GRAM NEGATIVE RODS   CULT (A) 01/17/2020    <10,000 COLONIES/mL INSIGNIFICANT GROWTH Performed at [facility], [address], [place], [place] [address]    CULT  01/17/2020    NO GROWTH 5 DAYS Performed at [facility] [address], [place], [place] [address]    CULT  01/17/2020    NO GROWTH 5 DAYS Performed at [facility] [address], [place], [place] [address]    LABORGA NO GROWTH 2 DAYS 07/19/2014     Lab Results  Component Value Date   ALBUMIN 3.7 02/23/2020   ALBUMIN 3.0 (L) 01/18/2020   ALBUMIN 3.4 (L) 01/17/2020    No results found for: MG No results found for: VD25OH  No results found for: PREALBUMIN CBC EXTENDED Latest Ref Rng & Units 02/23/2020 01/27/2020 01/19/2020  WBC 4.0 - 10.5 K/uL 3.9(L) 3.1(L) 3.2(L)  RBC 4.22 - 5.81 MIL/uL 3.97(L) 3.52(L) 3.42(L)  HGB 13.0 - 17.0 g/dL 11.6(L) 10.5(L) 9.9(L)  HCT 39 - 52 % 35.9(L) 31.2(L) 30.4(L)  PLT 150 - 400 K/uL 178 132(L) 167  NEUTROABS 1.7 - 7.7 K/uL - - -  LYMPHSABS  0.7 - 4.0 K/uL - - -     Body mass index is 24.39 kg/m.  Orders:  No orders of the defined types were placed in this encounter.  No orders of the defined types were placed in this encounter.    Procedures: No procedures performed  Clinical Data: No additional findings.  ROS:  All other systems negative, except as noted in the HPI. Review of Systems  Objective: Vital Signs: Ht 5\' 10"  (1.778 m)   Wt 170 lb (77.1 kg)   BMI 24.39 kg/m   Specialty Comments:  No specialty comments available.  PMFS History: Patient Active Problem List   Diagnosis Date Noted  . Hyperglycemia 05/12/2020  . Increased urinary frequency 05/09/2020  . Dehiscence of amputation stump ([place])   . Coronary artery disease 01/18/2020  . AKI  (acute kidney injury) ([place]) 01/17/2020  . Nocturnal leg cramps 10/28/2019  . Hyperlipidemia 05/26/2019  . Edema leg 12/29/2018  . STEMI involving left circumflex coronary artery ([place]) 07/12/2018  . Status post transmetatarsal amputation of foot, right ([place]) 07/08/2018  . Limited mobility 05/12/2018  . Gingival foreign body 07/22/2017  . Anxiety state 06/02/2017  . Leg swelling 01/29/2017  . Idiopathic chronic venous hypertension of both lower extremities with inflammation 09/24/2016  . Testicular mass 04/18/2016  . Subacute osteomyelitis, right ankle and foot ([place]) 01/29/2016  . Diabetic polyneuropathy associated with type 2 diabetes mellitus ([place])   . Type 2 diabetes mellitus with neurologic complication, with long-term current use of insulin ([place]) 12/08/2015  . Pulmonary nodule 02/01/2015  . Depression 12/09/2013  . Warts, genital 08/20/2013  . Diabetic retinopathy ([place]) 01/28/2013  . Sleep apnea 09/06/2010  . 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 Essential hypertension 11/09/2008  . COPD, mild ([place]) 10/06/2006  . SICKLE-CELL TRAIT 04/26/2006  . ERECTILE DYSFUNCTION 04/26/2006  . Tobacco abuse 04/26/2006  . PAD (peripheral artery disease) ([place]) 04/26/2006  . ALLERGIC RHINITIS 04/26/2006   Past Medical History:  Diagnosis Date  . Allergy   . Arthritis   . Bronchitis   . Cataract   . CHF (congestive heart failure) ([place])   . Chronic kidney disease   . Claudication [facility])    right foot ray resection  . Colon polyps    hyperplastic  . COPD (chronic obstructive pulmonary disease) ([place])   . Coronary artery disease   . Diabetes mellitus    type II  . Genital warts   . Gout   . Hyperlipidemia   . Hypertension   . Myocardial infarction ([place])   . Osteomyelitis of third toe of right foot ([place])   . Pneumonia   . Status post amputation of toe of right foot ([place]) 09/24/2016    Family History  Problem Relation Age of Onset  . Diabetes Mother   . Stroke Mother   . Heart failure  Father   . Colon cancer Neg Hx   . Esophageal cancer Neg Hx   . Rectal cancer Neg Hx   . Stomach cancer Neg Hx     Past Surgical History:  Procedure Laterality Date  . AMPUTATION Right 01/31/2016   Procedure: Right 2nd Toe Amputation;  Surgeon: Newt Minion, MD;  Location: [facility];  Service: Orthopedics;  Laterality: Right;  . AMPUTATION Right 07/08/2018   Procedure: RIGHT TRANSMETATARSAL AMPUTATION;  Surgeon: Newt Minion, MD;  Location: [facility];  Service: Orthopedics;  Laterality: Right;  . AMPUTATION Right 01/05/2020   Procedure: RIGHT BELOW KNEE AMPUTATION;  Surgeon: Newt Minion,  MD;  Location: [facility];  Service: Orthopedics;  Laterality: Right;  . AMPUTATION Right 02/23/2020   Procedure: RIGHT BELOW KNEE AMPUTATION REVISION;  Surgeon: Newt Minion, MD;  Location: [facility];  Service: Orthopedics;  Laterality: Right;  . CATARACT EXTRACTION     right eye  . COLONOSCOPY    . CORONARY STENT INTERVENTION N/A 07/12/2018   Procedure: CORONARY STENT INTERVENTION;  Surgeon: Troy Sine, MD;  Location: [facility];  Service: Cardiovascular;  Laterality: N/A;  . CORONARY/GRAFT ACUTE MI REVASCULARIZATION N/A 07/12/2018   Procedure: Coronary/Graft Acute MI Revascularization;  Surgeon: Troy Sine, MD;  Location: [facility];  Service: Cardiovascular;  Laterality: N/A;  . I & D EXTREMITY  04/11/2012   Procedure: IRRIGATION AND DEBRIDEMENT EXTREMITY;  Surgeon: Wylene Simmer, MD;  Location: [facility];  Service: Orthopedics;  Laterality: Right;  . LEFT HEART CATH AND CORONARY ANGIOGRAPHY N/A 07/12/2018   Procedure: LEFT HEART CATH AND CORONARY ANGIOGRAPHY;  Surgeon: Troy Sine, MD;  Location: [facility];  Service: Cardiovascular;  Laterality: N/A;  . Surgery left great toe    . Tear ducts bilateral eyes    . TRANSMETATARSAL AMPUTATION Right 07/08/2018   Social History   Occupational History  . Occupation: disabled  Tobacco Use  . Smoking status: Former Smoker    Packs/day:  0.30    Years: 48.00    Pack years: 14.40    Types: Cigars, Cigarettes    Start date: 07/02/1963    Quit date: 06/01/2019    Years since quitting: 0.9  . Smokeless tobacco: Former Network engineer  . Vaping Use: Former  Substance and Sexual Activity  . Alcohol use: Not Currently    Alcohol/week: 0.0 standard drinks  . Drug use: No  . Sexual activity: Not on file

## 2020-05-30 ENCOUNTER — Ambulatory Visit (INDEPENDENT_AMBULATORY_CARE_PROVIDER_SITE_OTHER): Payer: No Typology Code available for payment source | Admitting: Family Medicine

## 2020-05-30 DIAGNOSIS — Z5329 Procedure and treatment not carried out because of patient's decision for other reasons: Secondary | ICD-10-CM

## 2020-05-31 NOTE — Progress Notes (Signed)
No show

## 2020-06-05 ENCOUNTER — Telehealth: Payer: Self-pay

## 2020-06-05 NOTE — Telephone Encounter (Signed)
Can you please advise on rx to [employer]?

## 2020-06-05 NOTE — Telephone Encounter (Signed)
Patient called he is requesting a order to be sent in to [facility] for his shoe and right leg. [phone]

## 2020-06-06 NOTE — Telephone Encounter (Signed)
Fax rx for BKA prosthesis to [employer]

## 2020-06-07 ENCOUNTER — Telehealth: Payer: Self-pay | Admitting: Orthopedic Surgery

## 2020-06-07 NOTE — Telephone Encounter (Signed)
Order faxed.

## 2020-06-07 NOTE — Telephone Encounter (Signed)
I called and lm onv to advise that I had faxed over an order to [employer] for prosthetic and shoe for the other leg. I advised if there is anything else that they need to call and leave a detailed message so I can call back with some answers.

## 2020-06-07 NOTE — Telephone Encounter (Signed)
Pt called asking to speak with Dr. Sharol Given about his right leg.  Pt would like a call back [phone]

## 2020-06-08 ENCOUNTER — Telehealth: Payer: Self-pay

## 2020-06-08 NOTE — Telephone Encounter (Signed)
Patients wife called she is requesting rx for a prosthetic leg for [employer]. CB:[phone]

## 2020-06-08 NOTE — Telephone Encounter (Signed)
I called and lm on vm  at the number listed in message and at [phone] to advise that this was faxed the other day. I will fax again today. To call with any questions.

## 2020-06-09 ENCOUNTER — Telehealth: Payer: Self-pay | Admitting: Orthopedic Surgery

## 2020-06-09 NOTE — Telephone Encounter (Signed)
Pt called stating the rx faxed to [employer] yesterday was only for his foot and he needs a rx that say its for his leg; pt would like to have this faxed and would like to be updated when it's done.   [phone]

## 2020-06-12 NOTE — Telephone Encounter (Signed)
I called again to advise that this has been written and faxed. This has been done for his prosthetic. I have the copy of the rx that if they would like to come and pick up I can have it ready at the front dek. I have left multiple messages on both numbers listed in the chart that the rx has been faxed to [facility].

## 2020-06-13 ENCOUNTER — Other Ambulatory Visit: Payer: Self-pay | Admitting: Physician Assistant

## 2020-06-14 ENCOUNTER — Ambulatory Visit: Payer: No Typology Code available for payment source | Admitting: Podiatry

## 2020-06-15 ENCOUNTER — Encounter: Payer: Self-pay | Admitting: Physician Assistant

## 2020-06-15 ENCOUNTER — Ambulatory Visit (INDEPENDENT_AMBULATORY_CARE_PROVIDER_SITE_OTHER): Payer: Medicare HMO | Admitting: Physician Assistant

## 2020-06-15 DIAGNOSIS — Z89511 Acquired absence of right leg below knee: Secondary | ICD-10-CM

## 2020-06-15 NOTE — Progress Notes (Signed)
Office Visit Note   Patient: Adam Ray           Date of Birth: 21-Apr-1956           MRN: [id number] Visit Date: 06/15/2020              Requested by: [name], [place],  [place] [address] PCP: [name], MD  Chief Complaint  Patient presents with  . Left Leg - Wound Check      HPI: Is a pleasant [age] year old gentleman who is status post right below-knee amputation.  With regards that he is doing very well.  He has been struggling with left lower extremity swelling.  At his last visit with Dr. Sharol Given he was given a prescription to get compression socks.  Unfortunately he has had somewhat swelling in his ankle and foot especially his wife cannot get them over his foot and ankle no new injuries  Assessment & Plan: Visit Diagnoses: No diagnosis found.  Plan: Patient will be placed in a Profore wrap today.  Should follow-up on Tuesday for rewrapping.  Can transition back to his socks once swelling has subsided  Follow-Up Instructions: No follow-ups on file.   Ortho Exam  Patient is alert, oriented, no adenopathy, well-dressed, normal affect, normal respiratory effort. Examination demonstrates mild to moderate soft tissue swelling but no cellulitis or skin breakdown in the left leg.  In the left foot and ankle he does have a small heel ulcer this does not probe deeply.  He has massive swelling but no cellulitis or warmth or induration in his foot and ankle.  He does have som  ulcers in the second webspace again does not probe to bone. Imaging: No results found. No images are attached to the encounter.  Labs: Lab Results  Component Value Date   HGBA1C 11.5 (A) 05/08/2020   HGBA1C 8.7 (H) 01/19/2020   HGBA1C 7.2 (A) 10/28/2019   ESRSEDRATE 54 (H) 01/01/2020   ESRSEDRATE 8 01/29/2016   ESRSEDRATE 9 04/11/2012   CRP 2.6 (H) 01/29/2016   CRP 0.5 02/28/2014   REPTSTATUS 01/18/2020 FINAL 01/17/2020   REPTSTATUS 01/22/2020 FINAL  01/17/2020   REPTSTATUS 01/22/2020 FINAL 01/17/2020   GRAMSTAIN  04/11/2012    NO WBC SEEN RARE SQUAMOUS EPITHELIAL CELLS PRESENT MODERATE GRAM POSITIVE COCCI IN PAIRS RARE GRAM NEGATIVE RODS   GRAMSTAIN  04/11/2012    NO WBC SEEN RARE SQUAMOUS EPITHELIAL CELLS PRESENT MODERATE GRAM POSITIVE COCCI IN PAIRS RARE GRAM NEGATIVE RODS   CULT (A) 01/17/2020    <10,000 COLONIES/mL INSIGNIFICANT GROWTH Performed at [facility], [address] [place], [place] [address]    CULT  01/17/2020    NO GROWTH 5 DAYS Performed at [facility] [address], [place], [place] [address]    CULT  01/17/2020    NO GROWTH 5 DAYS Performed at [facility] [address], [place], [place] [address]    LABORGA NO GROWTH 2 DAYS 07/19/2014     Lab Results  Component Value Date   ALBUMIN 3.7 02/23/2020   ALBUMIN 3.0 (L) 01/18/2020   ALBUMIN 3.4 (L) 01/17/2020    No results found for: MG No results found for: VD25OH  No results found for: PREALBUMIN CBC EXTENDED Latest Ref Rng & Units 02/23/2020 01/27/2020 01/19/2020  WBC 4.0 - 10.5 K/uL 3.9(L) 3.1(L) 3.2(L)  RBC 4.22 - 5.81 MIL/uL 3.97(L) 3.52(L) 3.42(L)  HGB 13.0 - 17.0 g/dL  11.6(L) 10.5(L) 9.9(L)  HCT 39.0 - 52.0 % 35.9(L) 31.2(L) 30.4(L)  PLT 150 - 400 K/uL 178 132(L) 167  NEUTROABS 1.7 - 7.7 K/uL - - -  LYMPHSABS 0.7 - 4.0 K/uL - - -     There is no height or weight on file to calculate BMI.  Orders:  No orders of the defined types were placed in this encounter.  No orders of the defined types were placed in this encounter.    Procedures: No procedures performed  Clinical Data: No additional findings.  ROS:  All other systems negative, except as noted in the HPI. Review of Systems  Objective: Vital Signs: There were no vitals taken for this visit.  Specialty Comments:  No specialty comments available.  PMFS History: Patient Active Problem List   Diagnosis Date Noted  . Hyperglycemia 05/12/2020   . Increased urinary frequency 05/09/2020  . Dehiscence of amputation stump ([place])   . Coronary artery disease 01/18/2020  . AKI (acute kidney injury) ([place]) 01/17/2020  . Nocturnal leg cramps 10/28/2019  . Hyperlipidemia 05/26/2019  . Edema leg 12/29/2018  . STEMI involving left circumflex coronary artery ([place]) 07/12/2018  . Status post transmetatarsal amputation of foot, right ([place]) 07/08/2018  . Limited mobility 05/12/2018  . Gingival foreign body 07/22/2017  . Anxiety state 06/02/2017  . Leg swelling 01/29/2017  . Idiopathic chronic venous hypertension of both lower extremities with inflammation 09/24/2016  . Testicular mass 04/18/2016  . Subacute osteomyelitis, right ankle and foot ([place]) 01/29/2016  . Diabetic polyneuropathy associated with type 2 diabetes mellitus ([place])   . Type 2 diabetes mellitus with neurologic complication, with long-term current use of insulin ([place]) 12/08/2015  . Pulmonary nodule 02/01/2015  . Depression 12/09/2013  . Warts, genital 08/20/2013  . Diabetic retinopathy ([place]) 01/28/2013  . Sleep apnea 09/06/2010  . 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 Essential hypertension 11/09/2008  . COPD, mild ([place]) 10/06/2006  . SICKLE-CELL TRAIT 04/26/2006  . ERECTILE DYSFUNCTION 04/26/2006  . Tobacco abuse 04/26/2006  . PAD (peripheral artery disease) ([place]) 04/26/2006  . ALLERGIC RHINITIS 04/26/2006   Past Medical History:  Diagnosis Date  . Allergy   . Arthritis   . Bronchitis   . Cataract   . CHF (congestive heart failure) ([place])   . Chronic kidney disease   . Claudication [facility])    right foot ray resection  . Colon polyps    hyperplastic  . COPD (chronic obstructive pulmonary disease) ([place])   . Coronary artery disease   . Diabetes mellitus    type II  . Genital warts   . Gout   . Hyperlipidemia   . Hypertension   . Myocardial infarction ([place])   . Osteomyelitis of third toe of right foot ([place])   . Pneumonia   . Status post amputation of toe of  right foot ([place]) 09/24/2016    Family History  Problem Relation Age of Onset  . Diabetes Mother   . Stroke Mother   . Heart failure Father   . Colon cancer Neg Hx   . Esophageal cancer Neg Hx   . Rectal cancer Neg Hx   . Stomach cancer Neg Hx     Past Surgical History:  Procedure Laterality Date  . AMPUTATION Right 01/31/2016   Procedure: Right 2nd Toe Amputation;  Surgeon: Newt Minion, MD;  Location: [facility];  Service: Orthopedics;  Laterality: Right;  . AMPUTATION Right 07/08/2018   Procedure: RIGHT TRANSMETATARSAL AMPUTATION;  Surgeon: Newt Minion, MD;  Location: [facility];  Service: Orthopedics;  Laterality: Right;  . AMPUTATION Right 01/05/2020   Procedure: RIGHT BELOW KNEE AMPUTATION;  Surgeon: Newt Minion, MD;  Location: [facility];  Service: Orthopedics;  Laterality: Right;  . AMPUTATION Right 02/23/2020   Procedure: RIGHT BELOW KNEE AMPUTATION REVISION;  Surgeon: Newt Minion, MD;  Location: [facility];  Service: Orthopedics;  Laterality: Right;  . CATARACT EXTRACTION     right eye  . COLONOSCOPY    . CORONARY STENT INTERVENTION N/A 07/12/2018   Procedure: CORONARY STENT INTERVENTION;  Surgeon: Troy Sine, MD;  Location: [facility];  Service: Cardiovascular;  Laterality: N/A;  . CORONARY/GRAFT ACUTE MI REVASCULARIZATION N/A 07/12/2018   Procedure: Coronary/Graft Acute MI Revascularization;  Surgeon: Troy Sine, MD;  Location: [place] [facility];  Service: Cardiovascular;  Laterality: N/A;  . I & D EXTREMITY  04/11/2012   Procedure: IRRIGATION AND DEBRIDEMENT EXTREMITY;  Surgeon: Wylene Simmer, MD;  Location: [place] [place];  Service: Orthopedics;  Laterality: Right;  . LEFT HEART CATH AND CORONARY ANGIOGRAPHY N/A 07/12/2018   Procedure: LEFT HEART CATH AND CORONARY ANGIOGRAPHY;  Surgeon: Troy Sine, MD;  Location: [facility];  Service: Cardiovascular;  Laterality: N/A;  . Surgery left great toe    . Tear ducts bilateral eyes    . TRANSMETATARSAL AMPUTATION Right  07/08/2018   Social History   Occupational History  . Occupation: disabled  Tobacco Use  . Smoking status: Former Smoker    Packs/day: 0.30    Years: 48.00    Pack years: 14.40    Types: Cigars, Cigarettes    Start date: 07/02/1963    Quit date: 06/01/2019    Years since quitting: 1.0  . Smokeless tobacco: Former Network engineer  . Vaping Use: Former  Substance and Sexual Activity  . Alcohol use: Not Currently    Alcohol/week: 0.0 standard drinks  . Drug use: No  . Sexual activity: Not on file

## 2020-06-16 ENCOUNTER — Telehealth: Payer: Self-pay | Admitting: Family Medicine

## 2020-06-16 DIAGNOSIS — Z7409 Other reduced mobility: Secondary | ICD-10-CM

## 2020-06-16 NOTE — Telephone Encounter (Signed)
Received message from physical therapist with home health agency, saying that patient declined [facility] services and prefers referral to outpatient physical therapy at [facility] clinic.  Will place referral to this clinic.  [name], MD

## 2020-06-19 ENCOUNTER — Ambulatory Visit (INDEPENDENT_AMBULATORY_CARE_PROVIDER_SITE_OTHER): Payer: No Typology Code available for payment source | Admitting: Physician Assistant

## 2020-06-19 ENCOUNTER — Encounter: Payer: Self-pay | Admitting: Orthopedic Surgery

## 2020-06-19 VITALS — Ht 70.0 in | Wt 170.0 lb

## 2020-06-19 DIAGNOSIS — M79672 Pain in left foot: Secondary | ICD-10-CM

## 2020-06-19 NOTE — Progress Notes (Signed)
Office Visit Note   Patient: Adam Ray           Date of Birth: Jul 27, 1955           MRN: [id number] Visit Date: 06/19/2020              Requested by: [name], [place],  [place] [address] PCP: [name], MD  Chief Complaint  Patient presents with  . Left Leg - Wound Check      HPI: Patient presents in follow-up for his left lower extremity swelling.  He did have a Profore dressing on the 16th.  He did take this off yesterday his wife was able to place his sock and he is doing much better he will contact [employer] to ensure his prescription for his prosthetic is coming along.  As soon as he obtains this he will begin physical therapy.  Continue with sock.  Follow-up in 4 weeks  Assessment & Plan: Visit Diagnoses: No diagnosis found.  Plan:   Follow-Up Instructions: No follow-ups on file.   Ortho Exam  Patient is alert, oriented, no adenopathy, well-dressed, normal affect, normal respiratory effort. Examination of his left foot significant decrease in swelling he has wrinkling in the foot and ankle.  Compartments are soft and compressible no open ulcers or sores no cellulitis  Imaging: No results found. No images are attached to the encounter.  Labs: Lab Results  Component Value Date   HGBA1C 11.5 (A) 05/08/2020   HGBA1C 8.7 (H) 01/19/2020   HGBA1C 7.2 (A) 10/28/2019   ESRSEDRATE 54 (H) 01/01/2020   ESRSEDRATE 8 01/29/2016   ESRSEDRATE 9 04/11/2012   CRP 2.6 (H) 01/29/2016   CRP 0.5 02/28/2014   REPTSTATUS 01/18/2020 FINAL 01/17/2020   REPTSTATUS 01/22/2020 FINAL 01/17/2020   REPTSTATUS 01/22/2020 FINAL 01/17/2020   GRAMSTAIN  04/11/2012    NO WBC SEEN RARE SQUAMOUS EPITHELIAL CELLS PRESENT MODERATE GRAM POSITIVE COCCI IN PAIRS RARE GRAM NEGATIVE RODS   GRAMSTAIN  04/11/2012    NO WBC SEEN RARE SQUAMOUS EPITHELIAL CELLS PRESENT MODERATE GRAM POSITIVE COCCI IN PAIRS RARE GRAM NEGATIVE RODS   CULT (A)  01/17/2020    <10,000 COLONIES/mL INSIGNIFICANT GROWTH Performed at [facility], [address], [place], [place] [address]    CULT  01/17/2020    NO GROWTH 5 DAYS Performed at [facility] [address], [place], [place] [address]    CULT  01/17/2020    NO GROWTH 5 DAYS Performed at [facility] [address], [place], [place] [address]    LABORGA NO GROWTH 2 DAYS 07/19/2014     Lab Results  Component Value Date   ALBUMIN 3.7 02/23/2020   ALBUMIN 3.0 (L) 01/18/2020   ALBUMIN 3.4 (L) 01/17/2020    No results found for: MG No results found for: VD25OH  No results found for: PREALBUMIN CBC EXTENDED Latest Ref Rng & Units 02/23/2020 01/27/2020 01/19/2020  WBC 4.0 - 10.5 K/uL 3.9(L) 3.1(L) 3.2(L)  RBC 4.22 - 5.81 MIL/uL 3.97(L) 3.52(L) 3.42(L)  HGB 13.0 - 17.0 g/dL 11.6(L) 10.5(L) 9.9(L)  HCT 39.0 - 52.0 % 35.9(L) 31.2(L) 30.4(L)  PLT 150 - 400 K/uL 178 132(L) 167  NEUTROABS 1.7 - 7.7 K/uL - - -  LYMPHSABS 0.7 - 4.0 K/uL - - -     Body mass index is 24.39 kg/m.  Orders:  No orders of the defined types were placed in this encounter.  No  orders of the defined types were placed in this encounter.    Procedures: No procedures performed  Clinical Data: No additional findings.  ROS:  All other systems negative, except as noted in the HPI. Review of Systems  Objective: Vital Signs: Ht 5\' 10"  (1.778 m)   Wt 170 lb (77.1 kg)   BMI 24.39 kg/m   Specialty Comments:  No specialty comments available.  PMFS History: Patient Active Problem List   Diagnosis Date Noted  . Hyperglycemia 05/12/2020  . Increased urinary frequency 05/09/2020  . Dehiscence of amputation stump ([place])   . Coronary artery disease 01/18/2020  . AKI (acute kidney injury) ([place]) 01/17/2020  . Nocturnal leg cramps 10/28/2019  . Hyperlipidemia 05/26/2019  . Edema leg 12/29/2018  . STEMI involving left circumflex coronary artery ([place]) 07/12/2018  . Status post  transmetatarsal amputation of foot, right ([place]) 07/08/2018  . Limited mobility 05/12/2018  . Gingival foreign body 07/22/2017  . Anxiety state 06/02/2017  . Leg swelling 01/29/2017  . Idiopathic chronic venous hypertension of both lower extremities with inflammation 09/24/2016  . Testicular mass 04/18/2016  . Subacute osteomyelitis, right ankle and foot ([place]) 01/29/2016  . Diabetic polyneuropathy associated with type 2 diabetes mellitus ([place])   . Type 2 diabetes mellitus with neurologic complication, with long-term current use of insulin ([place]) 12/08/2015  . Pulmonary nodule 02/01/2015  . Depression 12/09/2013  . Warts, genital 08/20/2013  . Diabetic retinopathy ([place]) 01/28/2013  . Sleep apnea 09/06/2010  . 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 Essential hypertension 11/09/2008  . COPD, mild ([place]) 10/06/2006  . SICKLE-CELL TRAIT 04/26/2006  . ERECTILE DYSFUNCTION 04/26/2006  . Tobacco abuse 04/26/2006  . PAD (peripheral artery disease) ([place]) 04/26/2006  . ALLERGIC RHINITIS 04/26/2006   Past Medical History:  Diagnosis Date  . Allergy   . Arthritis   . Bronchitis   . Cataract   . CHF (congestive heart failure) ([place])   . Chronic kidney disease   . Claudication [facility])    right foot ray resection  . Colon polyps    hyperplastic  . COPD (chronic obstructive pulmonary disease) ([place])   . Coronary artery disease   . Diabetes mellitus    type II  . Genital warts   . Gout   . Hyperlipidemia   . Hypertension   . Myocardial infarction ([place])   . Osteomyelitis of third toe of right foot ([place])   . Pneumonia   . Status post amputation of toe of right foot ([place]) 09/24/2016    Family History  Problem Relation Age of Onset  . Diabetes Mother   . Stroke Mother   . Heart failure Father   . Colon cancer Neg Hx   . Esophageal cancer Neg Hx   . Rectal cancer Neg Hx   . Stomach cancer Neg Hx     Past Surgical History:  Procedure Laterality Date  . AMPUTATION Right 01/31/2016    Procedure: Right 2nd Toe Amputation;  Surgeon: Newt Minion, MD;  Location: [facility];  Service: Orthopedics;  Laterality: Right;  . AMPUTATION Right 07/08/2018   Procedure: RIGHT TRANSMETATARSAL AMPUTATION;  Surgeon: Newt Minion, MD;  Location: [place];  Service: Orthopedics;  Laterality: Right;  . AMPUTATION Right 01/05/2020   Procedure: RIGHT BELOW KNEE AMPUTATION;  Surgeon: Newt Minion, MD;  Location: [facility];  Service: Orthopedics;  Laterality: Right;  . AMPUTATION Right 02/23/2020   Procedure: RIGHT BELOW KNEE AMPUTATION REVISION;  Surgeon: Newt Minion, MD;  [facility];  Service: Orthopedics;  Laterality: Right;  . CATARACT EXTRACTION     right eye  . COLONOSCOPY    . CORONARY STENT INTERVENTION N/A 07/12/2018   Procedure: CORONARY STENT INTERVENTION;  Surgeon: Troy Sine, MD;  Location: [place] [facility];  Service: Cardiovascular;  Laterality: N/A;  . CORONARY/GRAFT ACUTE MI REVASCULARIZATION N/A 07/12/2018   Procedure: Coronary/Graft Acute MI Revascularization;  Surgeon: Troy Sine, MD;  Location: [facility];  Service: Cardiovascular;  Laterality: N/A;  . I & D EXTREMITY  04/11/2012   Procedure: IRRIGATION AND DEBRIDEMENT EXTREMITY;  Surgeon: Wylene Simmer, MD;  Location: [facility];  Service: Orthopedics;  Laterality: Right;  . LEFT HEART CATH AND CORONARY ANGIOGRAPHY N/A 07/12/2018   Procedure: LEFT HEART CATH AND CORONARY ANGIOGRAPHY;  Surgeon: Troy Sine, MD;  Location: [place] [facility];  Service: Cardiovascular;  Laterality: N/A;  . Surgery left great toe    . Tear ducts bilateral eyes    . TRANSMETATARSAL AMPUTATION Right 07/08/2018   Social History   Occupational History  . Occupation: disabled  Tobacco Use  . Smoking status: Former Smoker    Packs/day: 0.30    Years: 48.00    Pack years: 14.40    Types: Cigars, Cigarettes    Start date: 07/02/1963    Quit date: 06/01/2019    Years since quitting: 1.0  . Smokeless tobacco: Former Network engineer  .  Vaping Use: Former  Substance and Sexual Activity  . Alcohol use: Not Currently    Alcohol/week: 0.0 standard drinks  . Drug use: No  . Sexual activity: Not on file

## 2020-06-26 ENCOUNTER — Ambulatory Visit: Payer: No Typology Code available for payment source | Admitting: Physician Assistant

## 2020-06-29 ENCOUNTER — Ambulatory Visit: Payer: No Typology Code available for payment source

## 2020-07-05 ENCOUNTER — Ambulatory Visit (INDEPENDENT_AMBULATORY_CARE_PROVIDER_SITE_OTHER): Payer: No Typology Code available for payment source

## 2020-07-05 ENCOUNTER — Other Ambulatory Visit: Payer: Self-pay

## 2020-07-05 DIAGNOSIS — Z23 Encounter for immunization: Secondary | ICD-10-CM

## 2020-07-07 NOTE — Progress Notes (Signed)
   Covid-19 Vaccination Clinic  Name:  Adam Ray    MRN: [id number] DOB: 07-09-1955  07/05/2020  Patient presents to nurse clinic for booster COVID vaccination. Administered in RD, site unremarkable, tolerated injection well.   [name] was observed post Covid-19 immunization for 15 minutes without incident. He was provided with Vaccine Information Sheet and instruction to access the V-Safe system.   [name] was instructed to call 911 with any severe reactions post vaccine: [name] Difficulty breathing  . Swelling of face and throat  . A fast heartbeat  . A bad rash all over body  . Dizziness and weakness  Provided patient with updated immunization record and card.   Talbot Grumbling, RN

## 2020-07-13 ENCOUNTER — Ambulatory Visit (INDEPENDENT_AMBULATORY_CARE_PROVIDER_SITE_OTHER): Payer: No Typology Code available for payment source | Admitting: Family Medicine

## 2020-07-13 ENCOUNTER — Other Ambulatory Visit: Payer: Self-pay

## 2020-07-13 VITALS — BP 102/68 | HR 60

## 2020-07-13 DIAGNOSIS — H538 Other visual disturbances: Secondary | ICD-10-CM | POA: Insufficient documentation

## 2020-07-13 DIAGNOSIS — J069 Acute upper respiratory infection, unspecified: Secondary | ICD-10-CM | POA: Diagnosis not present

## 2020-07-13 DIAGNOSIS — R059 Cough, unspecified: Secondary | ICD-10-CM

## 2020-07-13 NOTE — Patient Instructions (Addendum)
[facility] [address] [place], [place] [address] Office: [phone]  You NEED to see your eye doctor in the next month (sooner the better). If you can not get transportation, please call and we will send a referral for an eye doctor in [place]. Your covid test should be back in 1-3 days. If you have any difficulty breathing, chest pain please go to the ED.   Transportation Resources: [employer] [phone]  Medicaid Transportation: will transport to medical appointments and pharmacy.  Call 3 days in advance of your appointment, select your plan below . Regular Medicaid Department of Social Services [phone]   . Healthy [facility] plan, please call: [phone] . [facility] Medicaid plan, please  [phone]  . [employer] Medicaid health plan call  [phone] . [employer] Medicaid Plan  call [place] Non- [employer] [place] for more information [phone] . ([place] Card/ [employer]) . Rural Comptroller-  Please contact your provider to see if they offer transportation to medical appointments. The phone number is listed on the back of your card.  SCAT: Only in [place] limits. Pay 1.50 per way.   Must complete an application and meet medical guidelines.   Call [phone] or visit  http://www.[place].gov/index.aspx?page=2188  [employer] ([place]) offers Discount Bus Fare to riders with Medicaid or Medicare Care   call   [phone] for discount price.  ________________________________________________________________________  Nurse, children's   [employer] 40 years and older Department of Social Services  [phone]  [employer] Medical Transportation Age 2 and older [phone] [place] for  non-medical rides,  Age 37 or older   [phone]  [employer] only --Dial a Ride for medical and non-medical,  Age 71 or older [place]:   [phone]

## 2020-07-13 NOTE — Assessment & Plan Note (Signed)
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  Blurred vision unchanged since onset after 03/2020 eye surgery performed at [facility] ophthalmology.  Noncontributory eye exam today, reassuringly no concerning s/sx for acute glaucoma.  Discussed the utmost importance that he follow-up with ophthalmology as soon as possible, offered placing referral to [facility] due to his transportation difficulties however he would prefer to follow-up with [facility].  He was instructed to call our office if he is unable to get to [facility] and will place referral to [facility] for evaluation.

## 2020-07-13 NOTE — Assessment & Plan Note (Addendum)
Day 4 of symptoms including dry cough/congestion.  Reassuringly well-appearing and breathing comfortably on exam today.  While his symptoms appear very mild and may be related to other viral illness, given his relatively recent symptom onset with numerous severe comorbidities, tested for COVID today.  If positive will place referral to monoclonal antibody infusion center.  Discussed supportive care with hydration, Tylenol.

## 2020-07-13 NOTE — Progress Notes (Signed)
    SUBJECTIVE:   CHIEF COMPLAINT / HPI: Cough/congestion    Adam Ray is a 65 year old male presenting for evaluation of cough, congestion/runny nose. He is accompanied by his wife who also is being evaluated for similar symptoms (however reported as covid booster side effects).   Started feeling different on 1/10 with sneezing, non-productive coughing, and congestion. Otherwise feels like himself. No fever, chest pain, or difficulty breathing.  He is fully vaccinated against COVID with Pfizer vaccine, just recently been boostered on 1/5.  He also reports bilateral blurry vision for the past several months after having L cataract surgery in 03/2020 without any worsening in severity.  Denies any significant eye pain.  Completed at [facility].  He called the ophthalmologist in 03/2020 stating that he could "barely see anything" with blurred vision since his surgery, scheduled for next day appointment however he did not have transportation to make it.  He still continues to struggle with transportation to get to [facility].  PERTINENT  PMH / PSH: HTN, PAD, T2DM, History of CAD with STEMI, S/p transmetatarsal amputation.   OBJECTIVE:   BP 102/68   Pulse 60   SpO2 97%   General: Alert, NAD HEENT: NCAT, MMM, oropharynx nonerythematous, no cervical lymphadenopathy present, EOMI, PERRLA, reports blurred vision generalized throughout with intact vision field testing, no conjunctival injection or drainage bilaterally Cardiac: RRR  Lungs: Clear bilaterally, no increased WOB on room air Ext: Warm, dry, 2+ distal pulses, s/p BKA on the right in wheelchair   ASSESSMENT/PLAN:   Viral upper respiratory illness Day 4 of symptoms including dry cough/congestion.  Reassuringly well-appearing and breathing comfortably on exam today.  While his symptoms appear very mild and may be related to other viral illness, given his relatively recent symptom onset with numerous severe comorbidities, tested for COVID today.  If  positive will place referral to monoclonal antibody infusion center.  Discussed supportive care with hydration, Tylenol.   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  Blurred vision unchanged since onset after 03/2020 eye surgery performed at [facility] ophthalmology.  Noncontributory eye exam today, reassuringly no concerning s/sx for acute glaucoma.  Discussed the utmost importance that he follow-up with ophthalmology as soon as possible, offered placing referral to [facility] due to his transportation difficulties however he would prefer to follow-up with [facility].  He was instructed to call our office if he is unable to get to [facility] and will place referral to [facility] for evaluation.    Provided transportation resources, however unfortunately most of those are confined to [place].  ED precautions discussed including any development of difficulty breathing/chest pain, otherwise follow-up if not improving or sooner if worsening.   Patriciaann Clan, [place]

## 2020-07-15 ENCOUNTER — Other Ambulatory Visit: Payer: Self-pay | Admitting: Family Medicine

## 2020-07-15 ENCOUNTER — Telehealth: Payer: Self-pay | Admitting: Family Medicine

## 2020-07-15 DIAGNOSIS — U071 COVID-19: Secondary | ICD-10-CM

## 2020-07-15 LAB — SARS-COV-2, NAA 2 DAY TAT

## 2020-07-15 LAB — NOVEL CORONAVIRUS, NAA: SARS-CoV-2, NAA: DETECTED — AB

## 2020-07-15 NOTE — Telephone Encounter (Signed)
Reached patient through spouse's cell number provided in demographics. Discussed COVID positive result, now day 5 since symptom onset. Fortunately, both he and his wife state they feel "great," and that he is not having too many symptoms anymore.  Discussed recommendation to continue 10-day quarantine, especially as he already stays at home and does not work. ED precautions reinforced including (not limited to) difficulty breathing, chest pain, confusion. Discussed monoclonal antibody referral, stated they would call to discuss/schedule if needed.    Patriciaann Clan, DO

## 2020-07-17 ENCOUNTER — Ambulatory Visit: Payer: No Typology Code available for payment source | Admitting: Physician Assistant

## 2020-07-19 ENCOUNTER — Ambulatory Visit: Payer: No Typology Code available for payment source | Admitting: Podiatry

## 2020-07-24 ENCOUNTER — Ambulatory Visit: Payer: No Typology Code available for payment source | Admitting: Orthopedic Surgery

## 2020-08-07 ENCOUNTER — Encounter: Payer: Self-pay | Admitting: Orthopedic Surgery

## 2020-08-07 ENCOUNTER — Ambulatory Visit (INDEPENDENT_AMBULATORY_CARE_PROVIDER_SITE_OTHER): Payer: Medicare Other | Admitting: Physician Assistant

## 2020-08-07 VITALS — Ht 70.0 in | Wt 170.0 lb

## 2020-08-07 DIAGNOSIS — Z89511 Acquired absence of right leg below knee: Secondary | ICD-10-CM

## 2020-08-07 MED ORDER — DOXYCYCLINE HYCLATE 100 MG PO TABS: 100.0000 mg | ORAL_TABLET | Freq: Two times a day (BID) | ORAL | 0 refills | Status: DC

## 2020-08-07 NOTE — Progress Notes (Signed)
Office Visit Note   Patient: Adam Ray           Date of Birth: May 07, 1956           MRN: [id number] Visit Date: 08/07/2020              Requested by: [name], [place],  [place] [address] PCP: [name], MD  Chief Complaint  Patient presents with  . Right Knee - Follow-up    Right below knee amputation revision 02/23/2020      HPI: Patient is status post right below-knee amputation revision.  With regards to that he is doing well and wants to begin physical therapy.  He is here also for reevaluation of his left lower extremity.  He has lower extremity swelling.  His he has been using a vive compression sock per his wife but today is wearing unsupportive white cotton sock.  His wife is worried about some maceration in his second webspace.  Also significant swelling in his ankle  Assessment & Plan: Visit Diagnoses: No diagnosis found.  Plan: We will place a compression wrap on the left lower extremity and instructed his wife to cleanse and dry well the areas in the webspace.  I will put them on a short course of doxycycline.  Follow-up in Friday hopefully to remove the wrap and place him in compression socks  Follow-Up Instructions: No follow-ups on file.   Ortho Exam  Patient is alert, oriented, no adenopathy, well-dressed, normal affect, normal respiratory effort. Left lower extremity.  He does have biphasic pulses by Doppler.  He does have significant swelling in the foot and ankle but no erythema or cellulitis or tenderness.  In the second webspace he does have an ulceration on the inside of the second toe.  There is no surrounding cellulitis it does not probe to bone.  Imaging: No results found. No images are attached to the encounter.  Labs: Lab Results  Component Value Date   HGBA1C 11.5 (A) 05/08/2020   HGBA1C 8.7 (H) 01/19/2020   HGBA1C 7.2 (A) 10/28/2019   ESRSEDRATE 54 (H) 01/01/2020   ESRSEDRATE 8 01/29/2016    ESRSEDRATE 9 04/11/2012   CRP 2.6 (H) 01/29/2016   CRP 0.5 02/28/2014   REPTSTATUS 01/18/2020 FINAL 01/17/2020   REPTSTATUS 01/22/2020 FINAL 01/17/2020   REPTSTATUS 01/22/2020 FINAL 01/17/2020   GRAMSTAIN  04/11/2012    NO WBC SEEN RARE SQUAMOUS EPITHELIAL CELLS PRESENT MODERATE GRAM POSITIVE COCCI IN PAIRS RARE GRAM NEGATIVE RODS   GRAMSTAIN  04/11/2012    NO WBC SEEN RARE SQUAMOUS EPITHELIAL CELLS PRESENT MODERATE GRAM POSITIVE COCCI IN PAIRS RARE GRAM NEGATIVE RODS   CULT (A) 01/17/2020    <10,000 COLONIES/mL INSIGNIFICANT GROWTH Performed at [facility], [address], [place], [place] [address]    CULT  01/17/2020    NO GROWTH 5 DAYS Performed at [facility] [address] [place], [place] [address]    CULT  01/17/2020    NO GROWTH 5 DAYS Performed at [facility] [address] [place], [place] [address]    LABORGA NO GROWTH 2 DAYS 07/19/2014     Lab Results  Component Value Date   ALBUMIN 3.7 02/23/2020   ALBUMIN 3.0 (L) 01/18/2020   ALBUMIN 3.4 (L) 01/17/2020    No results found for: MG No results found for: VD25OH  No results found for: PREALBUMIN CBC EXTENDED Latest Ref Rng & Units 02/23/2020  01/27/2020 01/19/2020  WBC 4.0 - 10.5 K/uL 3.9(L) 3.1(L) 3.2(L)  RBC 4.22 - 5.81 MIL/uL 3.97(L) 3.52(L) 3.42(L)  HGB 13.0 - 17.0 g/dL 11.6(L) 10.5(L) 9.9(L)  HCT 39.0 - 52.0 % 35.9(L) 31.2(L) 30.4(L)  PLT 150 - 400 K/uL 178 132(L) 167  NEUTROABS 1.7 - 7.7 K/uL - - -  LYMPHSABS 0.7 - 4.0 K/uL - - -     Body mass index is 24.39 kg/m.  Orders:  No orders of the defined types were placed in this encounter.  No orders of the defined types were placed in this encounter.    Procedures: No procedures performed  Clinical Data: No additional findings.  ROS:  All other systems negative, except as noted in the HPI. Review of Systems  Objective: Vital Signs: Ht 5\' 10"  (1.778 m)   Wt 170 lb (77.1 kg)   BMI 24.39 kg/m   Specialty  Comments:  No specialty comments available.  PMFS History: Patient Active Problem List   Diagnosis Date Noted  . Viral upper respiratory illness 07/13/2020  . Blurred vision, bilateral 07/13/2020  . Hyperglycemia 05/12/2020  . Increased urinary frequency 05/09/2020  . Dehiscence of amputation stump ([place])   . Coronary artery disease 01/18/2020  . AKI (acute kidney injury) ([place]) 01/17/2020  . Nocturnal leg cramps 10/28/2019  . Hyperlipidemia 05/26/2019  . Edema leg 12/29/2018  . STEMI involving left circumflex coronary artery ([place]) 07/12/2018  . Status post transmetatarsal amputation of foot, right ([place]) 07/08/2018  . Limited mobility 05/12/2018  . Gingival foreign body 07/22/2017  . Anxiety state 06/02/2017  . Leg swelling 01/29/2017  . Idiopathic chronic venous hypertension of both lower extremities with inflammation 09/24/2016  . Testicular mass 04/18/2016  . Subacute osteomyelitis, right ankle and foot ([place]) 01/29/2016  . Diabetic polyneuropathy associated with type 2 diabetes mellitus ([place])   . Type 2 diabetes mellitus with neurologic complication, with long-term current use of insulin ([place]) 12/08/2015  . Pulmonary nodule 02/01/2015  . Depression 12/09/2013  . Warts, genital 08/20/2013  . Diabetic retinopathy ([place]) 01/28/2013  . Sleep apnea 09/06/2010  . 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 Essential hypertension 11/09/2008  . COPD, mild ([place]) 10/06/2006  . SICKLE-CELL TRAIT 04/26/2006  . ERECTILE DYSFUNCTION 04/26/2006  . Tobacco abuse 04/26/2006  . PAD (peripheral artery disease) ([place]) 04/26/2006  . ALLERGIC RHINITIS 04/26/2006   Past Medical History:  Diagnosis Date  . Allergy   . Arthritis   . Bronchitis   . Cataract   . CHF (congestive heart failure) ([place])   . Chronic kidney disease   . Claudication [facility])    right foot ray resection  . Colon polyps    hyperplastic  . COPD (chronic obstructive pulmonary disease) ([place])   . Coronary artery disease   .  Diabetes mellitus    type II  . Genital warts   . Gout   . Hyperlipidemia   . Hypertension   . Myocardial infarction ([place])   . Osteomyelitis of third toe of right foot ([place])   . Pneumonia   . Status post amputation of toe of right foot ([place]) 09/24/2016    Family History  Problem Relation Age of Onset  . Diabetes Mother   . Stroke Mother   . Heart failure Father   . Colon cancer Neg Hx   . Esophageal cancer Neg Hx   . Rectal cancer Neg Hx   . Stomach cancer Neg Hx     Past Surgical History:  Procedure Laterality Date  .  AMPUTATION Right 01/31/2016   Procedure: Right 2nd Toe Amputation;  Surgeon: Newt Minion, MD;  Location: [facility];  Service: Orthopedics;  Laterality: Right;  . AMPUTATION Right 07/08/2018   Procedure: RIGHT TRANSMETATARSAL AMPUTATION;  Surgeon: Newt Minion, MD;  Location: [facility];  Service: Orthopedics;  Laterality: Right;  . AMPUTATION Right 01/05/2020   Procedure: RIGHT BELOW KNEE AMPUTATION;  Surgeon: Newt Minion, MD;  Location: [facility];  Service: Orthopedics;  Laterality: Right;  . AMPUTATION Right 02/23/2020   Procedure: RIGHT BELOW KNEE AMPUTATION REVISION;  Surgeon: Newt Minion, MD;  Location: [facility];  Service: Orthopedics;  Laterality: Right;  . CATARACT EXTRACTION     right eye  . COLONOSCOPY    . CORONARY STENT INTERVENTION N/A 07/12/2018   Procedure: CORONARY STENT INTERVENTION;  Surgeon: Troy Sine, MD;  Location: [facility];  Service: Cardiovascular;  Laterality: N/A;  . CORONARY/GRAFT ACUTE MI REVASCULARIZATION N/A 07/12/2018   Procedure: Coronary/Graft Acute MI Revascularization;  Surgeon: Troy Sine, MD;  Location: [facility];  Service: Cardiovascular;  Laterality: N/A;  . I & D EXTREMITY  04/11/2012   Procedure: IRRIGATION AND DEBRIDEMENT EXTREMITY;  Surgeon: Wylene Simmer, MD;  Location: [facility];  Service: Orthopedics;  Laterality: Right;  . LEFT HEART CATH AND CORONARY ANGIOGRAPHY N/A 07/12/2018   Procedure: LEFT HEART CATH AND  CORONARY ANGIOGRAPHY;  Surgeon: Troy Sine, MD;  Location: [facility];  Service: Cardiovascular;  Laterality: N/A;  . Surgery left great toe    . Tear ducts bilateral eyes    . TRANSMETATARSAL AMPUTATION Right 07/08/2018   Social History   Occupational History  . Occupation: disabled  Tobacco Use  . Smoking status: Former Smoker    Packs/day: 0.30    Years: 48.00    Pack years: 14.40    Types: Cigars, Cigarettes    Start date: 07/02/1963    Quit date: 06/01/2019    Years since quitting: 1.1  . Smokeless tobacco: Former Network engineer  . Vaping Use: Former  Substance and Sexual Activity  . Alcohol use: Not Currently    Alcohol/week: 0.0 standard drinks  . Drug use: No  . Sexual activity: Not on file

## 2020-08-10 ENCOUNTER — Other Ambulatory Visit: Payer: Self-pay

## 2020-08-10 ENCOUNTER — Ambulatory Visit (INDEPENDENT_AMBULATORY_CARE_PROVIDER_SITE_OTHER): Payer: No Typology Code available for payment source | Admitting: Family Medicine

## 2020-08-10 ENCOUNTER — Encounter: Payer: Self-pay | Admitting: Family Medicine

## 2020-08-10 VITALS — BP 112/62 | HR 53 | Wt 168.4 lb

## 2020-08-10 DIAGNOSIS — E11319 Type 2 diabetes mellitus with unspecified diabetic retinopathy without macular edema: Secondary | ICD-10-CM | POA: Diagnosis not present

## 2020-08-10 DIAGNOSIS — E1141 Type 2 diabetes mellitus with diabetic mononeuropathy: Secondary | ICD-10-CM

## 2020-08-10 DIAGNOSIS — R32 Unspecified urinary incontinence: Secondary | ICD-10-CM | POA: Diagnosis not present

## 2020-08-10 DIAGNOSIS — Z794 Long term (current) use of insulin: Secondary | ICD-10-CM

## 2020-08-10 LAB — POCT URINALYSIS DIP (MANUAL ENTRY)
Bilirubin, UA: NEGATIVE
Blood, UA: NEGATIVE
Glucose, UA: 100 mg/dL — AB
Ketones, POC UA: NEGATIVE mg/dL
Leukocytes, UA: NEGATIVE
Nitrite, UA: NEGATIVE
Protein Ur, POC: NEGATIVE mg/dL
Spec Grav, UA: 1.025 (ref 1.010–1.025)
Urobilinogen, UA: 1 E.U./dL
pH, UA: 6.5 (ref 5.0–8.0)

## 2020-08-10 LAB — POCT GLYCOSYLATED HEMOGLOBIN (HGB A1C): Hemoglobin A1C: 11.3 % — AB (ref 4.0–5.6)

## 2020-08-10 MED ORDER — TAMSULOSIN HCL 0.4 MG PO CAPS: 0.4000 mg | ORAL_CAPSULE | Freq: Every day | ORAL | 3 refills | Status: DC

## 2020-08-10 MED ORDER — TAMSULOSIN HCL 0.4 MG PO CAPS
0.4000 mg | ORAL_CAPSULE | Freq: Every day | ORAL | 3 refills | Status: DC
Start: 2020-08-10 — End: 2021-05-15

## 2020-08-10 NOTE — Assessment & Plan Note (Signed)
A1c elevated today Follow up separately for diabetes - need to bring sugar chart with him in order to make adjustments

## 2020-08-10 NOTE — Progress Notes (Signed)
  Date of Visit: 08/10/2020   SUBJECTIVE:   HPI:  Gawain presents today to discuss issues with his urine.  Having issues with weak urine stream and incontinence of urine almost every day. Does not see urology. No dysuria. Has to strain to urinate. He says this has gone on for about 1.5 months, but he complained of the same symptoms back at the end of September (see visit from 9/27).  Vision issues - requests getting set up with ophthalmologist here in town. Typically sees Dr. Helane Rima at [facility] but has significant issues with transportation to [facility].  OBJECTIVE:   BP 112/62   Pulse (!) 53   Wt 168 lb 6.4 oz (76.4 kg)   SpO2 97%   BMI 24.16 kg/m  Gen: no acute distress, pleasant, cooperative HEENT: ncat Lungs: normal work of breathing Rectal exam: negative without mass, lesions or tenderness, prostate normal in size without any palpable nodules. Chaperone present-  Deseree Blount CMA  ASSESSMENT/PLAN:   Urinary incontinence Prostate exam unremarkable today, but suspect BPH as cause. No signs of infection on UA. - check PSA - refer to urology - start flomax 0.4mg  daily  Diabetic retinopathy ([place]) Significant vision issues, followed by [facility], but now having trouble with transportation there. Will get him set up for visit here in [place].  Type 2 diabetes mellitus with neurologic complication, with long-term current use of insulin (HCC) A1c elevated today Follow up separately for diabetes - need to bring sugar chart with him in order to make adjustments   FOLLOW UP: Follow up for diabetes with me at next available appointment  Referring to urology Will get set up for Ophtho visit here in [place]. [name], [place] than 30 minutes were spent on this encounter on the day of service, including pre-visit planning, actual face to face time, coordination of care, and documentation of visit.

## 2020-08-10 NOTE — Assessment & Plan Note (Signed)
Prostate exam unremarkable today, but suspect BPH as cause. No signs of infection on UA. - check PSA - refer to urology - start flomax 0.4mg  daily

## 2020-08-10 NOTE — Assessment & Plan Note (Signed)
Significant vision issues, followed by [facility], but now having trouble with transportation there. Will get him set up for visit here in [place].

## 2020-08-10 NOTE — Patient Instructions (Addendum)
It was great to see you again today!  Checking urine and prostate tests today  Start flomax 0.4mg  daily  Referring to urologist  A1c is elevated - please follow up with your sugar log so we can discuss this more  Will get you an appointment with Dr. Coralyn Pear  Be well, Dr. [name]

## 2020-08-11 ENCOUNTER — Encounter: Payer: Self-pay | Admitting: Family Medicine

## 2020-08-11 ENCOUNTER — Ambulatory Visit: Payer: Self-pay

## 2020-08-11 ENCOUNTER — Encounter: Payer: Self-pay | Admitting: Physician Assistant

## 2020-08-11 ENCOUNTER — Ambulatory Visit (INDEPENDENT_AMBULATORY_CARE_PROVIDER_SITE_OTHER): Payer: No Typology Code available for payment source | Admitting: Physician Assistant

## 2020-08-11 VITALS — Ht 70.0 in | Wt 168.0 lb

## 2020-08-11 DIAGNOSIS — M79672 Pain in left foot: Secondary | ICD-10-CM

## 2020-08-11 LAB — PSA: Prostate Specific Ag, Serum: 0.1 ng/mL (ref 0.0–4.0)

## 2020-08-11 NOTE — Progress Notes (Signed)
Office Visit Note   Patient: Adam Ray           Date of Birth: [birth year]           MRN: [id number] Visit Date: 08/11/2020              Requested by: [name], [place],  [place] [address] PCP: [name], MD  Chief Complaint  Patient presents with  . Left Leg - Follow-up    1 week follow up       HPI: Patient comes in today for follow-up on his left lower extremity swelling.  At his last visit a week ago he was placed in a compression wrap.  He is here for follow-up today.  He complains of most of his pain being in his heel today.  Assessment & Plan: Visit Diagnoses:  1. Pain in left foot     Plan: Patient should continue on doxycycline.  I have providing them with a PRAFO boot and have placed them in his compression stocking.  I do have concerns with the purulent drainage in his pain that he does need to be rechecked on Monday as noted in my exam last week he does have biphasic pulses.  His wife will continue to keep the macerated area in the webspace dry and clean.  Follow-Up Instructions: No follow-ups on file.   Ortho Exam  Patient is alert, oriented, no adenopathy, well-dressed, normal affect, normal respiratory effort.  He does have tenderness to palpation in the heel though no cellulitis.  He does have callusing.  With some purulent drainage from the callus.  Does not probe deeply today.  No ascending cellulitis.  Maceration in the web spaces has improved and is drier.  Swelling is definitely better with wrinkling in the calf but still swelling in the heel and the ankle  Imaging: No results found. No images are attached to the encounter.  Labs: Lab Results  Component Value Date   HGBA1C 11.3 (A) 08/10/2020   HGBA1C 11.5 (A) 05/08/2020   HGBA1C 8.7 (H) 01/19/2020   ESRSEDRATE 54 (H) 01/01/2020   ESRSEDRATE 8 01/29/2016   ESRSEDRATE 9 04/11/2012   CRP 2.6 (H) 01/29/2016   CRP 0.5 02/28/2014   REPTSTATUS  01/18/2020 FINAL 01/17/2020   REPTSTATUS 01/22/2020 FINAL 01/17/2020   REPTSTATUS 01/22/2020 FINAL 01/17/2020   GRAMSTAIN  04/11/2012    NO WBC SEEN RARE SQUAMOUS EPITHELIAL CELLS PRESENT MODERATE GRAM POSITIVE COCCI IN PAIRS RARE GRAM NEGATIVE RODS   GRAMSTAIN  04/11/2012    NO WBC SEEN RARE SQUAMOUS EPITHELIAL CELLS PRESENT MODERATE GRAM POSITIVE COCCI IN PAIRS RARE GRAM NEGATIVE RODS   CULT (A) 01/17/2020    <10,000 COLONIES/mL INSIGNIFICANT GROWTH Performed at [facility], [address], [place], [place] [address]    CULT  01/17/2020    NO GROWTH 5 DAYS Performed at [facility] [address], [place], [place] [address]    CULT  01/17/2020    NO GROWTH 5 DAYS Performed at [facility] [address] [place], [place] [address]    LABORGA NO GROWTH 2 DAYS 07/19/2014     Lab Results  Component Value Date   ALBUMIN 3.7 02/23/2020   ALBUMIN 3.0 (L) 01/18/2020   ALBUMIN 3.4 (L) 01/17/2020    No results found for: MG No results found for: VD25OH  No results found for: PREALBUMIN CBC EXTENDED Latest Ref Rng & Units 02/23/2020 01/27/2020  01/19/2020  WBC 4.0 - 10.5 K/uL 3.9(L) 3.1(L) 3.2(L)  RBC 4.22 - 5.81 MIL/uL 3.97(L) 3.52(L) 3.42(L)  HGB 13.0 - 17.0 g/dL 11.6(L) 10.5(L) 9.9(L)  HCT 39.0 - 52.0 % 35.9(L) 31.2(L) 30.4(L)  PLT 150 - 400 K/uL 178 132(L) 167  NEUTROABS 1.7 - 7.7 K/uL - - -  LYMPHSABS 0.7 - 4.0 K/uL - - -     Body mass index is 24.11 kg/m.  Orders:  Orders Placed This Encounter  Procedures  . XR Foot 2 Views Left  . XR Os Calcis Left   No orders of the defined types were placed in this encounter.    Procedures: No procedures performed  Clinical Data: No additional findings.  ROS:  All other systems negative, except as noted in the HPI. Review of Systems  Objective: Vital Signs: Ht 5\' 10"  (1.778 m)   Wt 168 lb (76.2 kg)   BMI 24.11 kg/m   Specialty Comments:  No specialty comments available.  PMFS  History: Patient Active Problem List   Diagnosis Date Noted  . Urinary incontinence 08/10/2020  . Viral upper respiratory illness 07/13/2020  . Blurred vision, bilateral 07/13/2020  . Hyperglycemia 05/12/2020  . Increased urinary frequency 05/09/2020  . Dehiscence of amputation stump ([place])   . Coronary artery disease 01/18/2020  . AKI (acute kidney injury) ([place]) 01/17/2020  . Nocturnal leg cramps 10/28/2019  . Hyperlipidemia 05/26/2019  . Edema leg 12/29/2018  . STEMI involving left circumflex coronary artery ([place]) 07/12/2018  . Status post transmetatarsal amputation of foot, right ([place]) 07/08/2018  . Limited mobility 05/12/2018  . Gingival foreign body 07/22/2017  . Anxiety state 06/02/2017  . Leg swelling 01/29/2017  . Idiopathic chronic venous hypertension of both lower extremities with inflammation 09/24/2016  . Testicular mass 04/18/2016  . Subacute osteomyelitis, right ankle and foot ([place]) 01/29/2016  . Diabetic polyneuropathy associated with type 2 diabetes mellitus ([place])   . Type 2 diabetes mellitus with neurologic complication, with long-term current use of insulin ([place]) 12/08/2015  . Pulmonary nodule 02/01/2015  . Depression 12/09/2013  . Warts, genital 08/20/2013  . Diabetic retinopathy ([place]) 01/28/2013  . Sleep apnea 09/06/2010  . 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 Essential hypertension 11/09/2008  . COPD, mild ([place]) 10/06/2006  . SICKLE-CELL TRAIT 04/26/2006  . ERECTILE DYSFUNCTION 04/26/2006  . Tobacco abuse 04/26/2006  . PAD (peripheral artery disease) ([place]) 04/26/2006  . ALLERGIC RHINITIS 04/26/2006   Past Medical History:  Diagnosis Date  . Allergy   . Arthritis   . Bronchitis   . Cataract   . CHF (congestive heart failure) ([place])   . Chronic kidney disease   . Claudication [facility])    right foot ray resection  . Colon polyps    hyperplastic  . COPD (chronic obstructive pulmonary disease) ([place])   . Coronary artery disease   . Diabetes mellitus     type II  . Genital warts   . Gout   . Hyperlipidemia   . Hypertension   . Myocardial infarction ([place])   . Osteomyelitis of third toe of right foot ([place])   . Pneumonia   . Status post amputation of toe of right foot ([place]) 09/24/2016    Family History  Problem Relation Age of Onset  . Diabetes Mother   . Stroke Mother   . Heart failure Father   . Colon cancer Neg Hx   . Esophageal cancer Neg Hx   . Rectal cancer Neg Hx   . Stomach cancer Neg  Hx     Past Surgical History:  Procedure Laterality Date  . AMPUTATION Right 01/31/2016   Procedure: Right 2nd Toe Amputation;  Surgeon: Newt Minion, MD;  Location: [facility];  Service: Orthopedics;  Laterality: Right;  . AMPUTATION Right 07/08/2018   Procedure: RIGHT TRANSMETATARSAL AMPUTATION;  Surgeon: Newt Minion, MD;  Location: [facility];  Service: Orthopedics;  Laterality: Right;  . AMPUTATION Right 01/05/2020   Procedure: RIGHT BELOW KNEE AMPUTATION;  Surgeon: Newt Minion, MD;  Location: [facility];  Service: Orthopedics;  Laterality: Right;  . AMPUTATION Right 02/23/2020   Procedure: RIGHT BELOW KNEE AMPUTATION REVISION;  Surgeon: Newt Minion, MD;  Location: [facility];  Service: Orthopedics;  Laterality: Right;  . CATARACT EXTRACTION     right eye  . COLONOSCOPY    . CORONARY STENT INTERVENTION N/A 07/12/2018   Procedure: CORONARY STENT INTERVENTION;  Surgeon: Troy Sine, MD;  Location: [facility];  Service: Cardiovascular;  Laterality: N/A;  . CORONARY/GRAFT ACUTE MI REVASCULARIZATION N/A 07/12/2018   Procedure: Coronary/Graft Acute MI Revascularization;  Surgeon: Troy Sine, MD;  Location: [facility];  Service: Cardiovascular;  Laterality: N/A;  . I & D EXTREMITY  04/11/2012   Procedure: IRRIGATION AND DEBRIDEMENT EXTREMITY;  Surgeon: Wylene Simmer, MD;  Location: [facility];  Service: Orthopedics;  Laterality: Right;  . LEFT HEART CATH AND CORONARY ANGIOGRAPHY N/A 07/12/2018   Procedure: LEFT HEART CATH AND CORONARY ANGIOGRAPHY;   Surgeon: Troy Sine, MD;  Location: [facility];  Service: Cardiovascular;  Laterality: N/A;  . Surgery left great toe    . Tear ducts bilateral eyes    . TRANSMETATARSAL AMPUTATION Right 07/08/2018   Social History   Occupational History  . Occupation: disabled  Tobacco Use  . Smoking status: Former Smoker    Packs/day: 0.30    Years: 48.00    Pack years: 14.40    Types: Cigars, Cigarettes    Start date: 07/02/1963    Quit date: 06/01/2019    Years since quitting: 1.1  . Smokeless tobacco: Former Network engineer  . Vaping Use: Former  Substance and Sexual Activity  . Alcohol use: Not Currently    Alcohol/week: 0.0 standard drinks  . Drug use: No  . Sexual activity: Not on file

## 2020-08-14 ENCOUNTER — Ambulatory Visit: Payer: No Typology Code available for payment source | Admitting: Orthopedic Surgery

## 2020-08-15 ENCOUNTER — Encounter: Payer: Medicare Other | Admitting: Physical Therapy

## 2020-08-23 ENCOUNTER — Other Ambulatory Visit: Payer: Self-pay

## 2020-08-23 ENCOUNTER — Ambulatory Visit (INDEPENDENT_AMBULATORY_CARE_PROVIDER_SITE_OTHER): Payer: Medicare Other | Admitting: Physical Therapy

## 2020-08-23 ENCOUNTER — Encounter: Payer: Self-pay | Admitting: Physical Therapy

## 2020-08-23 DIAGNOSIS — R2689 Other abnormalities of gait and mobility: Secondary | ICD-10-CM | POA: Diagnosis not present

## 2020-08-23 DIAGNOSIS — R293 Abnormal posture: Secondary | ICD-10-CM | POA: Diagnosis not present

## 2020-08-23 DIAGNOSIS — M6281 Muscle weakness (generalized): Secondary | ICD-10-CM

## 2020-08-23 DIAGNOSIS — R2681 Unsteadiness on feet: Secondary | ICD-10-CM | POA: Diagnosis not present

## 2020-08-23 NOTE — Therapy (Signed)
[place], [place], [address] Phone: [phone]   Fax:  [phone]  Physical Therapy Evaluation  Patient Details  Name: Adam Ray MRN: [id number] Date of Birth: [birth year] Referring Provider (PT): Bevely Palmer Persons, [place]   Encounter Date: 08/23/2020   PT End of Session - 08/23/20 2115    Visit Number 1    Number of Visits 26    Date for PT Re-Evaluation 11/20/20    Authorization Type [employer] Medicare    Progress Note Due on Visit 10    PT Start Time 1430    PT Stop Time 1519    PT Time Calculation (min) 49 min           Past Medical History:  Diagnosis Date  . Allergy   . Arthritis   . Bronchitis   . Cataract   . CHF (congestive heart failure) ([place])   . Chronic kidney disease   . Claudication [facility])    right foot ray resection  . Colon polyps    hyperplastic  . COPD (chronic obstructive pulmonary disease) ([place])   . Coronary artery disease   . Diabetes mellitus    type II  . Genital warts   . Gout   . Hyperlipidemia   . Hypertension   . Myocardial infarction ([place])   . Osteomyelitis of third toe of right foot ([place])   . Pneumonia   . Status post amputation of toe of right foot ([place]) 09/24/2016    Past Surgical History:  Procedure Laterality Date  . AMPUTATION Right 01/31/2016   Procedure: Right 2nd Toe Amputation;  Surgeon: Newt Minion, MD;  Location: [place];  Service: Orthopedics;  Laterality: Right;  . AMPUTATION Right 07/08/2018   Procedure: RIGHT TRANSMETATARSAL AMPUTATION;  Surgeon: Newt Minion, MD;  Location: [place];  Service: Orthopedics;  Laterality: Right;  . AMPUTATION Right 01/05/2020   Procedure: RIGHT BELOW KNEE AMPUTATION;  Surgeon: Newt Minion, MD;  Location: [place];  Service: Orthopedics;  Laterality: Right;  . AMPUTATION Right 02/23/2020   Procedure: RIGHT BELOW KNEE AMPUTATION REVISION;  Surgeon: Newt Minion, MD;  Location: [place];  Service: Orthopedics;  Laterality: Right;  .  CATARACT EXTRACTION     right eye  . COLONOSCOPY    . CORONARY STENT INTERVENTION N/A 07/12/2018   Procedure: CORONARY STENT INTERVENTION;  Surgeon: Troy Sine, MD;  Location: [place] [facility];  Service: Cardiovascular;  Laterality: N/A;  . CORONARY/GRAFT ACUTE MI REVASCULARIZATION N/A 07/12/2018   Procedure: Coronary/Graft Acute MI Revascularization;  Surgeon: Troy Sine, MD;  Location: [place] [facility];  Service: Cardiovascular;  Laterality: N/A;  . I & D EXTREMITY  04/11/2012   Procedure: IRRIGATION AND DEBRIDEMENT EXTREMITY;  Surgeon: Wylene Simmer, MD;  Location: [place];  Service: Orthopedics;  Laterality: Right;  . LEFT HEART CATH AND CORONARY ANGIOGRAPHY N/A 07/12/2018   Procedure: LEFT HEART CATH AND CORONARY ANGIOGRAPHY;  Surgeon: Troy Sine, MD;  Location: [place] [facility];  Service: Cardiovascular;  Laterality: N/A;  . Surgery left great toe    . Tear ducts bilateral eyes    . TRANSMETATARSAL AMPUTATION Right 07/08/2018    There were no vitals filed for this visit.    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 He received a prosthesis on 07/24/2020    Patient is accompained by: Family member   wife, Gentle Hoge   Pertinent History HTN, CAD, MI 1/20, PAD, CHF, HFpEF, COPD, IDDM, CKD st2, OA, gout, hx alcohol abuse.    Patient Stated Goals To use prosthesis to walk in community & drive    Currently in Pain? No/denies              [facility] PT Assessment - 08/23/20 1430      Assessment   Medical Diagnosis Right Transtibial Amputation    Referring Provider (PT) Bevely Palmer Persons, PA    Onset Date/Surgical Date 07/24/20   Prosthesis delivery   Hand Dominance Left    Prior Therapy HHPT - pt discharged mid Sept      Precautions   Precautions Fall      Restrictions    Weight Bearing Restrictions No      Balance Screen   Has the patient fallen in the past 6 months No    Has the patient had a decrease in activity level because of a fear of falling?  No    Is the patient reluctant to leave their home because of a fear of falling?  No   in w/c     Home Environment   Living Environment Private residence    Living Arrangements Spouse/significant other    Type of [place] One level    [place] - 2 wheels;Cane - single point;Bedside commode;Cane - quad;Tub bench;Grab bars - tub/shower;Wheelchair - manual      Prior Function   Level of Independence Independent;Independent with household mobility without device;Independent with community mobility without device    Vocation On disability    Leisure fishing, hunting, garden      Posture/Postural Control   Posture/Postural Control Postural limitations    Postural Limitations Rounded Shoulders;Forward head;Flexed trunk;Weight shift left      AROM   Overall AROM  Within functional limits for tasks performed      Strength   Overall Strength Within functional limits for tasks performed;Deficits    Overall Strength Comments left ankle dorsiflexion 3+/5      Transfers   Transfers Sit to Stand;Stand to Sit    Sit to Stand 5: Supervision;With upper extremity assist;With armrests;From chair/3-in-1;Other (comment)   requires RW to stabilize   Stand to Sit 5: Supervision;With upper extremity assist;With armrests;To chair/3-in-1;Other (comment)   RW for stabilization     Ambulation/Gait   Ambulation/Gait Yes    Ambulation/Gait Assistance 5: Supervision    Ambulation/Gait Assistance Details excessive UE weight bearing & partial weight on prosthesis    Ambulation Distance (Feet) 100 Feet    Assistive device Rolling walker;Prosthesis    Gait Pattern Step-to pattern;Decreased stride length;Decreased step length - left;Decreased stance time - right;Right  circumduction;Right hip hike;Decreased weight shift to right;Right flexed knee in stance;Left flexed knee in stance;Antalgic;Lateral hip instability;Trunk flexed;Abducted- right    Ambulation Surface Level;Indoor    Gait velocity 1.21 ft/sec      Standardized Balance Assessment   Standardized Balance Assessment Berg Balance Test      Berg Balance Test   Sit to Stand Needs minimal aid to stand or to stabilize    Standing Unsupported Needs several tries to stand 30 seconds unsupported    Sitting with Back Unsupported but Feet Supported on Floor or Stool Able to sit safely and securely  2 minutes    Stand to Sit Controls descent by using hands    Transfers Able to transfer safely, definite need of hands    Standing Unsupported with Eyes Closed Unable to keep eyes closed 3 seconds but stays steady    Standing Unsupported with Feet Together Needs help to attain position and unable to hold for 15 seconds    From Standing, Reach Forward with Outstretched Arm Reaches forward but needs supervision    From Standing Position, Pick up Object from Floor Unable to pick up and needs supervision    From Standing Position, Turn to Look Behind Over each Shoulder Needs supervision when turning    Turn 360 Degrees Needs assistance while turning    Standing Unsupported, Alternately Place Feet on Step/Stool Needs assistance to keep from falling or unable to try    Standing Unsupported, One Foot in Front Needs help to step but can hold 15 seconds    Standing on One Leg Tries to lift leg/unable to hold 3 seconds but remains standing independently    Total Score 18           Prosthetics Assessment - 08/23/20 [place] with Skin check;Residual limb care;Care of non-amputated limb;Prosthetic cleaning;Ply sock cleaning;Correct ply sock adjustment;Proper wear schedule/adjustment;Proper weight-bearing schedule/adjustment    Donning prosthesis  Supervision    Doffing  prosthesis  Supervision    Current prosthetic wear tolerance (days/week)  reports most days. prsothesis delivered 30 days prior to PT evaluation and reports missed a few days of wear.    Current prosthetic wear tolerance (#hours/day)  3-6 hours 1x/day.   PT recommended starting with 4 hrs 2x/day with plans to increase q5-7 days if no issues.    Current prosthetic weight-bearing tolerance (hours/day)  Patient tolerated standing for 5 minutes with partial weight on prosthesis without c/o pain or discomfort    Edema pitting with 5-7 sec capillary refill    Residual limb condition  no open areas, dry skin,  no hair growth, normal color & termperature, shape cylinderical    Prosthesis Description silicon liner with shuttle pin lock, total contact socket, pt has placed foam pad in socket.  PT recommended seeing prosthetist for adjustments & pads.                     Objective measurements completed on examination: See above findings.       [facility] Adult PT Treatment/Exercise - 08/23/20 1430      Prosthetics   Prosthetic Care Comments  PT demo & verbal cues on driving with Right Transtibial Amputation / prosthesis.    Education Provided Skin check;Residual limb care;Prosthetic cleaning;Correct ply sock adjustment;Proper Donning;Proper wear schedule/adjustment;Other (comment)   see prosthetic care comments   Person(s) Educated Patient;Spouse    Education Method Explanation;Tactile cues;Verbal cues;Demonstration    Education Method Verbalized understanding;Tactile cues required;Verbal cues required;Needs further instruction                    PT Short Term Goals - 08/23/20 2130      PT SHORT TERM GOAL #1   Title Patient demonstrates proper donning & verbalizes proper cleaning of prosthesis    Time 1    Period Months    Status New    Target Date 09/20/20      PT SHORT TERM GOAL #2   Title Pateint tolerates prosthesis wear >/= 12 houes without skin issues.  Time 1     Period Months    Status New    Target Date 09/20/20      PT SHORT TERM GOAL #3   Title Berg Balance >= 25 / 56    Time 1    Period Months    Status New    Target Date 09/20/20      PT SHORT TERM GOAL #4   Title Patient ambulates 300' with RW & prosthesis with verbal cues for deviations no safety issues with supervision.    Time 1    Period Months    Status New    Target Date 09/20/20      PT SHORT TERM GOAL #5   Title Patient negotiates ramps & curbs with RW & prosthesis with minA    Time 1    Period Months    Status New    Target Date 09/20/20             PT Long Term Goals - 08/23/20 2126      PT LONG TERM GOAL #1   Title Pateint demonstrates & verbalizes proper prosthetic care to enable safe utilization of prosthesis.    Time 3    Period Months    Status New    Target Date 11/20/20      PT LONG TERM GOAL #2   Title Patient tolerates wear of prosthesis >90% of awake hours without skin or limb pain issues to enable function during his day.    Time 3    Period Months    Status New    Target Date 11/20/20      PT LONG TERM GOAL #3   Title Berg Balance >/= 45/56    Time 3    Period Months    Status New    Target Date 11/20/20      PT LONG TERM GOAL #4   Title Pateint ambulates with prosthesis with cane or less 500' modified independent.    Time 3    Period Months    Status New    Target Date 11/20/20      PT LONG TERM GOAL #5   Title Patient negotiates ramps, curbs and stairs with single rail with cane or less and prosthesis modified independent.    Time 3    Period Months    Status New    Target Date 11/20/20                  Plan - 08/23/20 2118    Clinical Impression Statement This 65yo male underwent a right Transtibial Amputation on 7/72021 with revision 02/23/2020 and received his first prosthesis on 07/24/2020. He is dependent in proper prosthetic care which increases risk of skin issues.  Pateint has limited wear of prosthesis which  lilmits function during his day.  Berg Balance 18/56 indicates high fall risk.  His prosthetic gait requires excessive UE weight bearing with gait deviations and gait velocity of 1.21 ft/sec indicate high fall risk.  Patient would benefit from skilled PT to improve function & safety with prosthesis.    Personal Factors and Comorbidities Comorbidity 3+;Fitness;Time since onset of injury/illness/exacerbation    Comorbidities HTN, CAD, MI 1/20, PAD, CHF, HFpEF, COPD, IDDM, CKD st2, OA, gout, hx alcohol abuse.    Examination-Activity Limitations Lift;Locomotion Level;Squat;Stairs;Stand;Transfers    Examination-Participation Restrictions Community Activity;Yard Work    Stability/Clinical Decision Making Evolving/Moderate complexity    Clinical Decision Making Moderate    Rehab Potential Good  PT Frequency 2x / week    PT Duration 12 weeks    PT Treatment/Interventions ADLs/Self Care Home Management;DME Instruction;Gait training;Stair training;Functional mobility training;Therapeutic activities;Therapeutic exercise;Balance training;Neuromuscular re-education;Patient/family education;Prosthetic Training;Vestibular    PT Next Visit Plan review prosthetic care, instruct in HEP at sink, prosthetic gait with RW including ramp & curb    Consulted and Agree with Plan of Care Patient;Family member/caregiver    Family Member Consulted wife, Nikos Anglemyer           Patient will benefit from skilled therapeutic intervention in order to improve the following deficits and impairments:  Abnormal gait,Cardiopulmonary status limiting activity,Decreased activity tolerance,Decreased balance,Decreased endurance,Decreased knowledge of use of DME,Decreased mobility,Decreased strength,Dizziness,Increased edema,Impaired flexibility,Postural dysfunction,Prosthetic Dependency,Obesity  Visit Diagnosis: Unsteadiness on feet  Other abnormalities of gait and mobility  Muscle weakness (generalized)  Abnormal  posture     Problem List Patient Active Problem List   Diagnosis Date Noted  . Urinary incontinence 08/10/2020  . Viral upper respiratory illness 07/13/2020  . Blurred vision, bilateral 07/13/2020  . Hyperglycemia 05/12/2020  . Increased urinary frequency 05/09/2020  . Dehiscence of amputation stump ([place])   . Coronary artery disease 01/18/2020  . AKI (acute kidney injury) ([place]) 01/17/2020  . Nocturnal leg cramps 10/28/2019  . Hyperlipidemia 05/26/2019  . Edema leg 12/29/2018  . STEMI involving left circumflex coronary artery ([place]) 07/12/2018  . Status post transmetatarsal amputation of foot, right ([place]) 07/08/2018  . Limited mobility 05/12/2018  . Gingival foreign body 07/22/2017  . Anxiety state 06/02/2017  . Leg swelling 01/29/2017  . Idiopathic chronic venous hypertension of both lower extremities with inflammation 09/24/2016  . Testicular mass 04/18/2016  . Subacute osteomyelitis, right ankle and foot ([place]) 01/29/2016  . Diabetic polyneuropathy associated with type 2 diabetes mellitus ([place])   . Type 2 diabetes mellitus with neurologic complication, with long-term current use of insulin ([place]) 12/08/2015  . Pulmonary nodule 02/01/2015  . Depression 12/09/2013  . Warts, genital 08/20/2013  . Diabetic retinopathy ([place]) 01/28/2013  . Sleep apnea 09/06/2010  . 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 Essential hypertension 11/09/2008  . COPD, mild ([place]) 10/06/2006  . SICKLE-CELL TRAIT 04/26/2006  . ERECTILE DYSFUNCTION 04/26/2006  . Tobacco abuse 04/26/2006  . PAD (peripheral artery disease) ([place]) 04/26/2006  . ALLERGIC RHINITIS 04/26/2006    Jamey Reas, PT, DPT 08/23/2020, 9:33 PM  [facility] [address] [place], [place], [address] Phone: [phone]   Fax:  [phone]  Name: Adam Ray MRN: [id number] Date of Birth: 1955-10-08

## 2020-08-24 ENCOUNTER — Ambulatory Visit: Payer: No Typology Code available for payment source | Admitting: Family Medicine

## 2020-08-24 ENCOUNTER — Ambulatory Visit (INDEPENDENT_AMBULATORY_CARE_PROVIDER_SITE_OTHER): Payer: Medicare Other | Admitting: Orthopedic Surgery

## 2020-08-24 DIAGNOSIS — I739 Peripheral vascular disease, unspecified: Secondary | ICD-10-CM

## 2020-08-24 DIAGNOSIS — Z89511 Acquired absence of right leg below knee: Secondary | ICD-10-CM

## 2020-08-24 DIAGNOSIS — I87323 Chronic venous hypertension (idiopathic) with inflammation of bilateral lower extremity: Secondary | ICD-10-CM

## 2020-08-25 ENCOUNTER — Encounter: Payer: Self-pay | Admitting: Orthopedic Surgery

## 2020-08-25 NOTE — Progress Notes (Signed)
Office Visit Note   Patient: Adam Ray           Date of Birth: 1955/08/07           MRN: [id number] Visit Date: 08/24/2020              Requested by: [name], [place],  [place] [address] PCP: [name], MD  Chief Complaint  Patient presents with  . Left Foot - Follow-up      HPI: Patient is seen in follow-up for both lower extremities he previously had a decubitus left heel ulcer he was placed on PRAFO with doxycycline he states it has been difficult to wear the PRAFO.  Assessment & Plan: Visit Diagnoses:  1. Acquired absence of right leg below knee ([place])   2. Peripheral vascular disease ([place])   3. Idiopathic chronic venous hypertension of both lower extremities with inflammation     Plan: Recommend wearing the PRAFO 24 hours a day.  Elevate the foot and evaluate the swelling at follow-up.  Follow-Up Instructions: Return in about 1 week (around 08/31/2020).   Ortho Exam  Patient is alert, oriented, no adenopathy, well-dressed, normal affect, normal respiratory effort. Examination patient has massive venous stasis swelling at this time of the foot and ankle he has fixed clawing of the toes and developing ulcer between the toes.  We will place him in a compression wrap and reevaluate in 1 week.  The ulcer on the heel has completely resolved.  Imaging: No results found. No images are attached to the encounter.  Labs: Lab Results  Component Value Date   HGBA1C 11.3 (A) 08/10/2020   HGBA1C 11.5 (A) 05/08/2020   HGBA1C 8.7 (H) 01/19/2020   ESRSEDRATE 54 (H) 01/01/2020   ESRSEDRATE 8 01/29/2016   ESRSEDRATE 9 04/11/2012   CRP 2.6 (H) 01/29/2016   CRP 0.5 02/28/2014   REPTSTATUS 01/18/2020 FINAL 01/17/2020   REPTSTATUS 01/22/2020 FINAL 01/17/2020   REPTSTATUS 01/22/2020 FINAL 01/17/2020   GRAMSTAIN  04/11/2012    NO WBC SEEN RARE SQUAMOUS EPITHELIAL CELLS PRESENT MODERATE GRAM POSITIVE COCCI IN PAIRS RARE GRAM  NEGATIVE RODS   GRAMSTAIN  04/11/2012    NO WBC SEEN RARE SQUAMOUS EPITHELIAL CELLS PRESENT MODERATE GRAM POSITIVE COCCI IN PAIRS RARE GRAM NEGATIVE RODS   CULT (A) 01/17/2020    <10,000 COLONIES/mL INSIGNIFICANT GROWTH Performed at [facility], [address] [place], [place] [address]    CULT  01/17/2020    NO GROWTH 5 DAYS Performed at [facility] [address], [place], [place] [address]    CULT  01/17/2020    NO GROWTH 5 DAYS Performed at [facility] [address] [place], [place] [address]    LABORGA NO GROWTH 2 DAYS 07/19/2014     Lab Results  Component Value Date   ALBUMIN 3.7 02/23/2020   ALBUMIN 3.0 (L) 01/18/2020   ALBUMIN 3.4 (L) 01/17/2020    No results found for: MG No results found for: VD25OH  No results found for: PREALBUMIN CBC EXTENDED Latest Ref Rng & Units 02/23/2020 01/27/2020 01/19/2020  WBC 4.0 - 10.5 K/uL 3.9(L) 3.1(L) 3.2(L)  RBC 4.22 - 5.81 MIL/uL 3.97(L) 3.52(L) 3.42(L)  HGB 13.0 - 17.0 g/dL 11.6(L) 10.5(L) 9.9(L)  HCT 39.0 - 52.0 % 35.9(L) 31.2(L) 30.4(L)  PLT 150 - 400 K/uL 178 132(L) 167  NEUTROABS 1.7 - 7.7 K/uL - - -  LYMPHSABS 0.7 - 4.0 K/uL - - -  There is no height or weight on file to calculate BMI.  Orders:  No orders of the defined types were placed in this encounter.  No orders of the defined types were placed in this encounter.    Procedures: No procedures performed  Clinical Data: No additional findings.  ROS:  All other systems negative, except as noted in the HPI. Review of Systems  Objective: Vital Signs: There were no vitals taken for this visit.  Specialty Comments:  No specialty comments available.  PMFS History: Patient Active Problem List   Diagnosis Date Noted  . Urinary incontinence 08/10/2020  . Viral upper respiratory illness 07/13/2020  . Blurred vision, bilateral 07/13/2020  . Hyperglycemia 05/12/2020  . Increased urinary frequency 05/09/2020  . Dehiscence of  amputation stump ([place])   . Coronary artery disease 01/18/2020  . AKI (acute kidney injury) ([place]) 01/17/2020  . Nocturnal leg cramps 10/28/2019  . Hyperlipidemia 05/26/2019  . Edema leg 12/29/2018  . STEMI involving left circumflex coronary artery ([place]) 07/12/2018  . Status post transmetatarsal amputation of foot, right ([place]) 07/08/2018  . Limited mobility 05/12/2018  . Gingival foreign body 07/22/2017  . Anxiety state 06/02/2017  . Leg swelling 01/29/2017  . Idiopathic chronic venous hypertension of both lower extremities with inflammation 09/24/2016  . Testicular mass 04/18/2016  . Subacute osteomyelitis, right ankle and foot ([place]) 01/29/2016  . Diabetic polyneuropathy associated with type 2 diabetes mellitus ([place])   . Type 2 diabetes mellitus with neurologic complication, with long-term current use of insulin ([place]) 12/08/2015  . Pulmonary nodule 02/01/2015  . Depression 12/09/2013  . Warts, genital 08/20/2013  . Diabetic retinopathy ([place]) 01/28/2013  . Sleep apnea 09/06/2010  . 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 Essential hypertension 11/09/2008  . COPD, mild ([place]) 10/06/2006  . SICKLE-CELL TRAIT 04/26/2006  . ERECTILE DYSFUNCTION 04/26/2006  . Tobacco abuse 04/26/2006  . PAD (peripheral artery disease) ([place]) 04/26/2006  . ALLERGIC RHINITIS 04/26/2006   Past Medical History:  Diagnosis Date  . Allergy   . Arthritis   . Bronchitis   . Cataract   . CHF (congestive heart failure) ([place])   . Chronic kidney disease   . Claudication [facility])    right foot ray resection  . Colon polyps    hyperplastic  . COPD (chronic obstructive pulmonary disease) ([place])   . Coronary artery disease   . Diabetes mellitus    type II  . Genital warts   . Gout   . Hyperlipidemia   . Hypertension   . Myocardial infarction ([place])   . Osteomyelitis of third toe of right foot ([place])   . Pneumonia   . Status post amputation of toe of right foot ([place]) 09/24/2016    Family History  Problem Relation  Age of Onset  . Diabetes Mother   . Stroke Mother   . Heart failure Father   . Colon cancer Neg Hx   . Esophageal cancer Neg Hx   . Rectal cancer Neg Hx   . Stomach cancer Neg Hx     Past Surgical History:  Procedure Laterality Date  . AMPUTATION Right 01/31/2016   Procedure: Right 2nd Toe Amputation;  Surgeon: Newt Minion, MD;  Location: [facility];  Service: Orthopedics;  Laterality: Right;  . AMPUTATION Right 07/08/2018   Procedure: RIGHT TRANSMETATARSAL AMPUTATION;  Surgeon: Newt Minion, MD;  Location: [place];  Service: Orthopedics;  Laterality: Right;  . AMPUTATION Right 01/05/2020   Procedure: RIGHT BELOW KNEE AMPUTATION;  Surgeon: Sharol Given,  Illene Regulus, MD;  Location: [facility];  Service: Orthopedics;  Laterality: Right;  . AMPUTATION Right 02/23/2020   Procedure: RIGHT BELOW KNEE AMPUTATION REVISION;  Surgeon: Newt Minion, MD;  Location: [facility];  Service: Orthopedics;  Laterality: Right;  . CATARACT EXTRACTION     right eye  . COLONOSCOPY    . CORONARY STENT INTERVENTION N/A 07/12/2018   Procedure: CORONARY STENT INTERVENTION;  Surgeon: Troy Sine, MD;  Location: [place] [facility];  Service: Cardiovascular;  Laterality: N/A;  . CORONARY/GRAFT ACUTE Ray REVASCULARIZATION N/A 07/12/2018   Procedure: Coronary/Graft Acute Ray Revascularization;  Surgeon: Troy Sine, MD;  Location: [place] [facility];  Service: Cardiovascular;  Laterality: N/A;  . I & D EXTREMITY  04/11/2012   Procedure: IRRIGATION AND DEBRIDEMENT EXTREMITY;  Surgeon: Wylene Simmer, MD;  Location: [facility];  Service: Orthopedics;  Laterality: Right;  . LEFT HEART CATH AND CORONARY ANGIOGRAPHY N/A 07/12/2018   Procedure: LEFT HEART CATH AND CORONARY ANGIOGRAPHY;  Surgeon: Troy Sine, MD;  Location: [place] [facility];  Service: Cardiovascular;  Laterality: N/A;  . Surgery left great toe    . Tear ducts bilateral eyes    . TRANSMETATARSAL AMPUTATION Right 07/08/2018   Social History   Occupational History  . Occupation:  disabled  Tobacco Use  . Smoking status: Former Smoker    Packs/day: 0.30    Years: 48.00    Pack years: 14.40    Types: Cigars, Cigarettes    Start date: 07/02/1963    Quit date: 06/01/2019    Years since quitting: 1.2  . Smokeless tobacco: Former Network engineer  . Vaping Use: Former  Substance and Sexual Activity  . Alcohol use: Not Currently    Alcohol/week: 0.0 standard drinks  . Drug use: No  . Sexual activity: Not on file

## 2020-08-31 ENCOUNTER — Ambulatory Visit: Payer: No Typology Code available for payment source | Admitting: Orthopedic Surgery

## 2020-09-03 ENCOUNTER — Other Ambulatory Visit: Payer: Self-pay | Admitting: Physician Assistant

## 2020-09-04 ENCOUNTER — Encounter: Payer: Self-pay | Admitting: Physical Therapy

## 2020-09-04 ENCOUNTER — Ambulatory Visit (INDEPENDENT_AMBULATORY_CARE_PROVIDER_SITE_OTHER): Payer: Medicare HMO | Admitting: Orthopedic Surgery

## 2020-09-04 ENCOUNTER — Other Ambulatory Visit: Payer: Self-pay

## 2020-09-04 ENCOUNTER — Encounter: Payer: Self-pay | Admitting: Orthopedic Surgery

## 2020-09-04 ENCOUNTER — Ambulatory Visit (INDEPENDENT_AMBULATORY_CARE_PROVIDER_SITE_OTHER): Payer: Medicare HMO | Admitting: Physical Therapy

## 2020-09-04 VITALS — Ht 70.0 in | Wt 168.0 lb

## 2020-09-04 DIAGNOSIS — R293 Abnormal posture: Secondary | ICD-10-CM

## 2020-09-04 DIAGNOSIS — R2689 Other abnormalities of gait and mobility: Secondary | ICD-10-CM | POA: Diagnosis not present

## 2020-09-04 DIAGNOSIS — R2681 Unsteadiness on feet: Secondary | ICD-10-CM

## 2020-09-04 DIAGNOSIS — M6281 Muscle weakness (generalized): Secondary | ICD-10-CM

## 2020-09-04 DIAGNOSIS — I87323 Chronic venous hypertension (idiopathic) with inflammation of bilateral lower extremity: Secondary | ICD-10-CM

## 2020-09-04 DIAGNOSIS — Z89511 Acquired absence of right leg below knee: Secondary | ICD-10-CM | POA: Diagnosis not present

## 2020-09-04 NOTE — Patient Instructions (Signed)
Wear prosthesis 4hrs in morning drying your limb & liner after 2 hrs immediately putting prosthesis back on limb to complete the 4 hrs. Remove liner & prosthesis for 2 hours wearing your black shrinker Wear prosthesis another 4hrs drying your limb & liner after 2 hrs immediately putting prosthesis back on limb to complete the 4 hrs.  Sweating increases with an amputation. Your body is trying to regulate your temperature & without an extremity, you sweat more easily to cool off. Also prosthetic material like liners do not breath and add hot layers which causes even more sweating. With time your body typically will accommodate to prosthesis and your sweat level will come closer to level with amputation but not pre-amputation level.   You need to pat your limb & liner dry when you notice sweating. If you leave sweat trapped inside your liner, then it can result in a blister.   Signs of sweating in your liner: 1. You are sweating elsewhere on your body or you notice sweat running / dripping.  2. Take note of how high your liner comes up on your limb when you first put your liner on your limb. If you notice that your liner has slipped down, then you probably have sweat inside your liner. A good time to check for liner slippage is when toileting.  3. You feel air bubbles inside your liner. When you liner slips, then air is allowed in bottom. As you put weight on prosthesis, the air is burp or pushed out. 4. You feel something crawling or moving inside your liner. When sweat runs inside the closed system of liner, it often feels like a bug or something crawling inside your liner.  If any of above symptoms are noted, you need to remove your prosthesis & liner to pat your limb & liner dry. This is permanent need as leaving sweat or water trapped can result in a blister or wound.    [employer] Socks:  1-ply is thin no color at top,  3-ply is yellow at top,  5-ply is green at top [employer]: 1-ply is yellow  color at top, 3-ply is green at top, 5-ply is navy blue at top How many ply you need depends on your limb size.  You should have even pressure on your limb when standing & walking.  Guidance points: 1. How ease it goes on? Should be some resistance. Too few it goes on too easily. Too many it takes a lot of work to get it on. 2. How many clicks you get. Especially clicks in sitting. 3. After standing or walking, check knee cap. Bottom should be just under the front lip.  Too few bottom of knee cap sits on indention. Too many bottom is above front lip. 4. Have your feet beside each other & hips over feet. Place hands on your waist. Pelvis Should be level. Too few prosthetic side will be low. Too many prosthetic side will be high.    Get ply socks correct before you leave the house. Take extra socks with you. Take one 3-ply and two 1-ply with you. This is in addition to what you are wearing.

## 2020-09-04 NOTE — Therapy (Signed)
[facility] [address] [place], [place], [address] Phone: [phone]   Fax:  [phone]  Physical Therapy Treatment  Patient Details  Name: Adam Ray MRN: [id number] Date of Birth: Nov 10, 1955 Referring Provider (PT): Bevely Palmer Persons, [place]   Encounter Date: 09/04/2020   PT End of Session - 09/04/20 1349    Visit Number 2    Number of Visits 26    Date for PT Re-Evaluation 11/20/20    Authorization Type [employer]    Progress Note Due on Visit 10    PT Start Time 1345    PT Stop Time 1425    PT Time Calculation (min) 40 min           Past Medical History:  Diagnosis Date   Allergy    Arthritis    Bronchitis    Cataract    CHF (congestive heart failure) ([place])    Chronic kidney disease    Claudication ([place])    right foot ray resection   Colon polyps    hyperplastic   COPD (chronic obstructive pulmonary disease) ([place])    Coronary artery disease    Diabetes mellitus    type II   Genital warts    Gout    Hyperlipidemia    Hypertension    Myocardial infarction ([place])    Osteomyelitis of third toe of right foot ([place])    Pneumonia    Status post amputation of toe of right foot ([place]) 09/24/2016    Past Surgical History:  Procedure Laterality Date   AMPUTATION Right 01/31/2016   Procedure: Right 2nd Toe Amputation;  Surgeon: Newt Minion, MD;  Location: [place];  Service: Orthopedics;  Laterality: Right;   AMPUTATION Right 07/08/2018   Procedure: RIGHT TRANSMETATARSAL AMPUTATION;  Surgeon: Newt Minion, MD;  Location: [place];  Service: Orthopedics;  Laterality: Right;   AMPUTATION Right 01/05/2020   Procedure: RIGHT BELOW KNEE AMPUTATION;  Surgeon: Newt Minion, MD;  Location: [place];  Service: Orthopedics;  Laterality: Right;   AMPUTATION Right 02/23/2020   Procedure: RIGHT BELOW KNEE AMPUTATION REVISION;  Surgeon: Newt Minion, MD;  Location: [place];  Service: Orthopedics;  Laterality: Right;   CATARACT  EXTRACTION     right eye   COLONOSCOPY     CORONARY STENT INTERVENTION N/A 07/12/2018   Procedure: CORONARY STENT INTERVENTION;  Surgeon: Troy Sine, MD;  Location: [place] [facility];  Service: Cardiovascular;  Laterality: N/A;   CORONARY/GRAFT ACUTE MI REVASCULARIZATION N/A 07/12/2018   Procedure: Coronary/Graft Acute MI Revascularization;  Surgeon: Troy Sine, MD;  Location: [place] [facility];  Service: Cardiovascular;  Laterality: N/A;   I & D EXTREMITY  04/11/2012   Procedure: IRRIGATION AND DEBRIDEMENT EXTREMITY;  Surgeon: Wylene Simmer, MD;  Location: [place];  Service: Orthopedics;  Laterality: Right;   LEFT HEART CATH AND CORONARY ANGIOGRAPHY N/A 07/12/2018   Procedure: LEFT HEART CATH AND CORONARY ANGIOGRAPHY;  Surgeon: Troy Sine, MD;  Location: [place] [facility];  Service: Cardiovascular;  Laterality: N/A;   Surgery left great toe     Tear ducts bilateral eyes     TRANSMETATARSAL AMPUTATION Right 07/08/2018    There were no vitals filed for this visit.   Subjective Assessment - 09/04/20 1348    Subjective He is donning prosthesis ~2hrs after arising, wearing ~8 hrs then removes 2 hrs before bed.  He thinks skin looks okay. The shin bone hurts after he has had it  on a while.    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    Patient Stated Goals To use prosthesis to walk in community & drive    Currently in Pain? No/denies                             [facility] Adult PT Treatment/Exercise - 09/04/20 1345      Transfers   Transfers Sit to Stand;Stand to Sit    Sit to Stand 5: Supervision;With upper extremity assist;With armrests;From chair/3-in-1;Other (comment)   requires RW to stabilize   Stand to Sit 5: Supervision;With upper extremity assist;With armrests;To chair/3-in-1;Other (comment)   RW for stabilization      Ambulation/Gait   Ambulation/Gait Yes    Ambulation/Gait Assistance 5: Supervision    Ambulation/Gait Assistance Details worked on step through pattern with demo, verbal & tactile cues    Ambulation Distance (Feet) 150 Feet    Assistive device Rolling walker;Prosthesis    Gait Pattern Step-to pattern;Decreased stride length;Decreased step length - left;Decreased stance time - right;Right circumduction;Right hip hike;Decreased weight shift to right;Right flexed knee in stance;Left flexed knee in stance;Antalgic;Lateral hip instability;Trunk flexed;Abducted- right    Gait velocity --    Ramp 4: Min assist   RW & prosthesis   Ramp Details (indicate cue type and reason) tactile & verbal cues on technique    Curb 4: Min assist   min guard with RW & prosthesis   Curb Details (indicate cue type and reason) tactile & verbal cues on technique    Pre-Gait Activities in //bars using lines on floor for equal step through pattern      Posture/Postural Control   Posture/Postural Control --    Postural Limitations --      Exercises   Exercises Knee/Hip      Knee/Hip Exercises: Seated   Sit to Sand 1 set;10 reps;without UE support   from 24" bar stool     Prosthetics   Prosthetic Care Comments  PT instructed in sweat management including signs of sweating. PT adjusted wear as his skin is blistering distally with sweat issues.  PT instructed in adjusting ply socks with too few, too many & correct ply fit.    Current prosthetic wear tolerance (days/week)  daily    Current prosthetic wear tolerance (#hours/day)  8 hrs 1x/day,  PT recommended 4 hrs 2x/day with off for 2 hours between and drying limb/liner midpoint of 4hrs. Pt verbalized understanding.    Current prosthetic weight-bearing tolerance (hours/day)  Patient tolerated standing for 5 minutes with partial weight on prosthesis without c/o pain or discomfort    Edema pitting with 5-7 sec capillary refill    Residual limb condition  distal limb  appears pre-blister from sweat in liner, limb was sweaty when prosthesis doffed, no open areas, dry skin,  no hair growth, normal color & termperature, shape cylinderical    Education Provided Skin check;Residual limb care;Prosthetic cleaning;Correct ply sock adjustment;Proper Donning;Proper wear schedule/adjustment;Other (comment)   see prosthetic care comments   Person(s) Educated Patient    Education Method Explanation;Demonstration;Tactile cues;Handout;Verbal cues    Education Method Verbalized understanding;Tactile cues required;Verbal cues required;Needs further instruction    Donning Prosthesis Supervision                    PT  Short Term Goals - 08/23/20 2130      PT SHORT TERM GOAL #1   Title Patient demonstrates proper donning & verbalizes proper cleaning of prosthesis    Time 1    Period Months    Status New    Target Date 09/20/20      PT SHORT TERM GOAL #2   Title Pateint tolerates prosthesis wear >/= 12 houes without skin issues.    Time 1    Period Months    Status New    Target Date 09/20/20      PT SHORT TERM GOAL #3   Title Berg Balance >= 25 / 56    Time 1    Period Months    Status New    Target Date 09/20/20      PT SHORT TERM GOAL #4   Title Patient ambulates 300' with RW & prosthesis with verbal cues for deviations no safety issues with supervision.    Time 1    Period Months    Status New    Target Date 09/20/20      PT SHORT TERM GOAL #5   Title Patient negotiates ramps & curbs with RW & prosthesis with minA    Time 1    Period Months    Status New    Target Date 09/20/20             PT Long Term Goals - 08/23/20 2126      PT LONG TERM GOAL #1   Title Pateint demonstrates & verbalizes proper prosthetic care to enable safe utilization of prosthesis.    Time 3    Period Months    Status New    Target Date 11/20/20      PT LONG TERM GOAL #2   Title Patient tolerates wear of prosthesis >90% of awake hours without skin or limb  pain issues to enable function during his day.    Time 3    Period Months    Status New    Target Date 11/20/20      PT LONG TERM GOAL #3   Title Berg Balance >/= 45/56    Time 3    Period Months    Status New    Target Date 11/20/20      PT LONG TERM GOAL #4   Title Pateint ambulates with prosthesis with cane or less 500' modified independent.    Time 3    Period Months    Status New    Target Date 11/20/20      PT LONG TERM GOAL #5   Title Patient negotiates ramps, curbs and stairs with single rail with cane or less and prosthesis modified independent.    Time 3    Period Months    Status New    Target Date 11/20/20                 Plan - 09/04/20 1350    Clinical Impression Statement Patient's residual limb was sweaty and skin appears close to blistering distally from wearing prosthesis too long too quickly.  PT instructed in sweat signs & management and adjusting ply fit. He appears to have a general understanding.    Personal Factors and Comorbidities Comorbidity 3+;Fitness;Time since onset of injury/illness/exacerbation    Comorbidities HTN, CAD, MI 1/20, PAD, CHF, HFpEF, COPD, IDDM, CKD st2, OA, gout, hx alcohol abuse.    Examination-Activity Limitations Lift;Locomotion Level;Squat;Stairs;Stand;Transfers    Examination-Participation Restrictions Community Activity;[place] Leaver Work  Stability/Clinical Decision Making Evolving/Moderate complexity    Rehab Potential Good    PT Frequency 2x / week    PT Duration 12 weeks    PT Treatment/Interventions ADLs/Self Care Home Management;DME Instruction;Gait training;Stair training;Functional mobility training;Therapeutic activities;Therapeutic exercise;Balance training;Neuromuscular re-education;Patient/family education;Prosthetic Training;Vestibular    PT Next Visit Plan review prosthetic care, instruct in HEP at sink, prosthetic gait with RW including ramp & curb    Consulted and Agree with Plan of Care Patient;Family  member/caregiver    Family Member Consulted wife, Merville Hijazi           Patient will benefit from skilled therapeutic intervention in order to improve the following deficits and impairments:  Abnormal gait,Cardiopulmonary status limiting activity,Decreased activity tolerance,Decreased balance,Decreased endurance,Decreased knowledge of use of DME,Decreased mobility,Decreased strength,Dizziness,Increased edema,Impaired flexibility,Postural dysfunction,Prosthetic Dependency,Obesity  Visit Diagnosis: Unsteadiness on feet  Other abnormalities of gait and mobility  Muscle weakness (generalized)  Abnormal posture     Problem List Patient Active Problem List   Diagnosis Date Noted   Urinary incontinence 08/10/2020   Viral upper respiratory illness 07/13/2020   Blurred vision, bilateral 07/13/2020   Hyperglycemia 05/12/2020   Increased urinary frequency 05/09/2020   Dehiscence of amputation stump ([place])    Coronary artery disease 01/18/2020   AKI (acute kidney injury) ([place]) 01/17/2020   Nocturnal leg cramps 10/28/2019   Hyperlipidemia 05/26/2019   Edema leg 12/29/2018   STEMI involving left circumflex coronary artery ([place]) 07/12/2018   Status post transmetatarsal amputation of foot, right ([place]) 07/08/2018   Limited mobility 05/12/2018   Gingival foreign body 07/22/2017   Anxiety state 06/02/2017   Leg swelling 01/29/2017   Idiopathic chronic venous hypertension of both lower extremities with inflammation 09/24/2016   Testicular mass 04/18/2016   Subacute osteomyelitis, right ankle and foot ([place]) 01/29/2016   Diabetic polyneuropathy associated with type 2 diabetes mellitus ([place])    Type 2 diabetes mellitus with neurologic complication, with long-term current use of insulin ([place]) 12/08/2015   Pulmonary nodule 02/01/2015   Depression 12/09/2013   Warts, genital 08/20/2013   Diabetic retinopathy ([place]) 01/28/2013   Sleep apnea 09/06/2010   BICUSPID  AORTIC VALVE 05/07/2010   Essential hypertension 11/09/2008   COPD, mild ([place]) 10/06/2006   SICKLE-CELL TRAIT 04/26/2006   ERECTILE DYSFUNCTION 04/26/2006   Tobacco abuse 04/26/2006   PAD (peripheral artery disease) ([place]) 04/26/2006   ALLERGIC RHINITIS 04/26/2006    Jamey Reas, PT, DPT 09/04/2020, 3:44 PM  [facility] [address] [place], [place], [address] Phone: [phone]   Fax:  [phone]  Name: Adam Ray MRN: [id number] Date of Birth: [birth year]

## 2020-09-05 ENCOUNTER — Encounter: Payer: No Typology Code available for payment source | Admitting: Physical Therapy

## 2020-09-11 ENCOUNTER — Other Ambulatory Visit: Payer: Self-pay

## 2020-09-11 ENCOUNTER — Encounter: Payer: Self-pay | Admitting: Physical Therapy

## 2020-09-11 ENCOUNTER — Ambulatory Visit (INDEPENDENT_AMBULATORY_CARE_PROVIDER_SITE_OTHER): Payer: Medicare HMO | Admitting: Physical Therapy

## 2020-09-11 DIAGNOSIS — M6281 Muscle weakness (generalized): Secondary | ICD-10-CM

## 2020-09-11 DIAGNOSIS — R2689 Other abnormalities of gait and mobility: Secondary | ICD-10-CM | POA: Diagnosis not present

## 2020-09-11 DIAGNOSIS — R2681 Unsteadiness on feet: Secondary | ICD-10-CM

## 2020-09-11 DIAGNOSIS — R293 Abnormal posture: Secondary | ICD-10-CM

## 2020-09-11 NOTE — Therapy (Signed)
[place], [place], [address] Phone: [phone]   Fax:  [phone]  Physical Therapy Treatment  Patient Details  Name: Adam Ray MRN: [id number] Date of Birth: 06-02-1956 Referring Provider (PT): Bevely Palmer Persons, [place]   Encounter Date: 09/11/2020   PT End of Session - 09/11/20 1359    Visit Number 3    Number of Visits 26    Date for PT Re-Evaluation 11/20/20    Authorization Type [employer]    Progress Note Due on Visit 10    PT Start Time 1345    PT Stop Time 1430    PT Time Calculation (min) 45 min           Past Medical History:  Diagnosis Date  . Allergy   . Arthritis   . Bronchitis   . Cataract   . CHF (congestive heart failure) ([place])   . Chronic kidney disease   . Claudication [facility])    right foot ray resection  . Colon polyps    hyperplastic  . COPD (chronic obstructive pulmonary disease) ([place])   . Coronary artery disease   . Diabetes mellitus    type II  . Genital warts   . Gout   . Hyperlipidemia   . Hypertension   . Myocardial infarction ([place])   . Osteomyelitis of third toe of right foot ([place])   . Pneumonia   . Status post amputation of toe of right foot ([place]) 09/24/2016    Past Surgical History:  Procedure Laterality Date  . AMPUTATION Right 01/31/2016   Procedure: Right 2nd Toe Amputation;  Surgeon: Newt Minion, MD;  Location: [facility];  Service: Orthopedics;  Laterality: Right;  . AMPUTATION Right 07/08/2018   Procedure: RIGHT TRANSMETATARSAL AMPUTATION;  Surgeon: Newt Minion, MD;  Location: [place];  Service: Orthopedics;  Laterality: Right;  . AMPUTATION Right 01/05/2020   Procedure: RIGHT BELOW KNEE AMPUTATION;  Surgeon: Newt Minion, MD;  Location: [place];  Service: Orthopedics;  Laterality: Right;  . AMPUTATION Right 02/23/2020   Procedure: RIGHT BELOW KNEE AMPUTATION REVISION;  Surgeon: Newt Minion, MD;  Location: [place];  Service: Orthopedics;  Laterality: Right;  .  CATARACT EXTRACTION     right eye  . COLONOSCOPY    . CORONARY STENT INTERVENTION N/A 07/12/2018   Procedure: CORONARY STENT INTERVENTION;  Surgeon: Troy Sine, MD;  Location: [place];  Service: Cardiovascular;  Laterality: N/A;  . CORONARY/GRAFT ACUTE MI REVASCULARIZATION N/A 07/12/2018   Procedure: Coronary/Graft Acute MI Revascularization;  Surgeon: Troy Sine, MD;  Location: [place] [facility];  Service: Cardiovascular;  Laterality: N/A;  . I & D EXTREMITY  04/11/2012   Procedure: IRRIGATION AND DEBRIDEMENT EXTREMITY;  Surgeon: Wylene Simmer, MD;  Location: [facility];  Service: Orthopedics;  Laterality: Right;  . LEFT HEART CATH AND CORONARY ANGIOGRAPHY N/A 07/12/2018   Procedure: LEFT HEART CATH AND CORONARY ANGIOGRAPHY;  Surgeon: Troy Sine, MD;  Location: [facility];  Service: Cardiovascular;  Laterality: N/A;  . Surgery left great toe    . Tear ducts bilateral eyes    . TRANSMETATARSAL AMPUTATION Right 07/08/2018    There were no vitals filed for this visit.   Subjective Assessment - 09/11/20 1351    Subjective He has worn prosthesis ~1 hour after arising, 2hrs break midday and removes at bedtime.  The bottom of limb hurts after being up 1-3 hours after donning.  Patient is accompained by: Family member   wife, Theodus Ran   Pertinent History HTN, CAD, MI 1/20, PAD, CHF, HFpEF, COPD, IDDM, CKD st2, OA, gout, hx alcohol abuse.    Patient Stated Goals To use prosthesis to walk in community & drive    Currently in Pain? Yes    Pain Score 3     Pain Location Leg    Pain Orientation Right;Distal    Pain Descriptors / Indicators Aching;Pressure;Sharp    Pain Type Other (Comment)   prosthetic   Pain Onset 1 to 4 weeks ago    Pain Frequency Intermittent    Aggravating Factors  standing & walking with prosthesis    Pain Relieving Factors sitting with weight off prosthesis                             [facility] Adult PT Treatment/Exercise -  09/11/20 1345      Transfers   Transfers Sit to Stand;Stand to Sit    Sit to Stand 5: Supervision;With upper extremity assist;With armrests;From chair/3-in-1;Other (comment)   requires RW to stabilize   Sit to Stand Details Visual cues for safe use of DME/AE;Verbal cues for safe use of DME/AE;Verbal cues for technique    Sit to Stand Details (indicate cue type and reason) worked on standing from chair without armrests using UEs.    Stand to Sit 5: Supervision;With upper extremity assist;With armrests;To chair/3-in-1;Other (comment)   RW for stabilization   Stand to Sit Details (indicate cue type and reason) Visual cues for safe use of DME/AE;Verbal cues for safe use of DME/AE;Verbal cues for technique    Stand to Sit Details worked on controlled      Ambulation/Gait   Ambulation/Gait Yes    Ambulation/Gait Assistance 5: Supervision    Ambulation/Gait Assistance Details verbal cues on upright posture and wt shift over prosthesis in stance    Ambulation Distance (Feet) 150 Feet    Assistive device Rolling walker;Prosthesis    Gait Pattern Step-to pattern;Decreased stride length;Decreased step length - left;Decreased stance time - right;Right circumduction;Right hip hike;Decreased weight shift to right;Right flexed knee in stance;Left flexed knee in stance;Antalgic;Lateral hip instability;Trunk flexed;Abducted- right    Ambulation Surface Level;Indoor    Stairs Yes    Stairs Assistance 5: Supervision    Stairs Assistance Details (indicate cue type and reason) PT demo & verbal cues on technique with 2 rails forward facing & single rail BUEs sidestepping    Stair Management Technique Two rails;Forwards;One rail Right;One rail Left;Sideways;Step to pattern    Number of Stairs 6   2 w/2 rails, 2w/ right rail & 2 w/left rail   Height of Stairs 6    Ramp 4: Min assist   RW & prosthesis   Ramp Details (indicate cue type and reason) tactile & verbal cues on technique    Curb 4: Min assist   min guard  with RW & prosthesis   Curb Details (indicate cue type and reason) tactile & verbal cues on technique      Exercises   Exercises Knee/Hip      Knee/Hip Exercises: Aerobic   Nustep seat 13 with BLEs & BUEs level 5 for 10 min      Knee/Hip Exercises: Seated   Sit to Sand 1 set;10 reps;without UE support   from 24" bar stool     Prosthetics   Prosthetic Care Comments  PT reviewed adjusting ply socks including fact that  weight on & off limb with gait pumps fluid out of limb so not uncommon to need to add 1-3 ply after wearing prosthesis 1-2 hours. Then many be back to lower number in morning.  Distal limb pain maybe due to excessive weight on distal limb with too few ply socks.  PT recommended pads added to socket when at prosthetist's appt on Wednesday. Depending on size & thickness of pads, he will need to decrease number of ply socks.    Current prosthetic wear tolerance (days/week)  daily    Current prosthetic wear tolerance (#hours/day)  5-6 hrs 2x/day with 2 hrs off midday.    Current prosthetic weight-bearing tolerance (hours/day)  Patient tolerated standing for 5 minutes with partial weight on prosthesis without c/o pain or discomfort    Edema pitting with 5-7 sec capillary refill    Residual limb condition  distal limb appears pre-blister from sweat in liner, limb was sweaty when prosthesis doffed, no open areas, dry skin,  no hair growth, normal color & termperature, shape cylinderical    Education Provided Skin check;Residual limb care;Prosthetic cleaning;Correct ply sock adjustment;Proper Donning;Proper wear schedule/adjustment;Other (comment)   see prosthetic care comments   Person(s) Educated Patient;Spouse    Education Method Explanation;Demonstration;Tactile cues;Verbal cues    Education Method Verbalized understanding;Tactile cues required;Verbal cues required;Needs further instruction                    PT Short Term Goals - 08/23/20 2130      PT SHORT TERM GOAL #1    Title Patient demonstrates proper donning & verbalizes proper cleaning of prosthesis    Time 1    Period Months    Status New    Target Date 09/20/20      PT SHORT TERM GOAL #2   Title Pateint tolerates prosthesis wear >/= 12 houes without skin issues.    Time 1    Period Months    Status New    Target Date 09/20/20      PT SHORT TERM GOAL #3   Title Berg Balance >= 25 / 56    Time 1    Period Months    Status New    Target Date 09/20/20      PT SHORT TERM GOAL #4   Title Patient ambulates 300' with RW & prosthesis with verbal cues for deviations no safety issues with supervision.    Time 1    Period Months    Status New    Target Date 09/20/20      PT SHORT TERM GOAL #5   Title Patient negotiates ramps & curbs with RW & prosthesis with minA    Time 1    Period Months    Status New    Target Date 09/20/20             PT Long Term Goals - 08/23/20 2126      PT LONG TERM GOAL #1   Title Pateint demonstrates & verbalizes proper prosthetic care to enable safe utilization of prosthesis.    Time 3    Period Months    Status New    Target Date 11/20/20      PT LONG TERM GOAL #2   Title Patient tolerates wear of prosthesis >90% of awake hours without skin or limb pain issues to enable function during his day.    Time 3    Period Months    Status New    Target Date 11/20/20  PT LONG TERM GOAL #3   Title Berg Balance >/= 45/56    Time 3    Period Months    Status New    Target Date 11/20/20      PT LONG TERM GOAL #4   Title Pateint ambulates with prosthesis with cane or less 500' modified independent.    Time 3    Period Months    Status New    Target Date 11/20/20      PT LONG TERM GOAL #5   Title Patient negotiates ramps, curbs and stairs with single rail with cane or less and prosthesis modified independent.    Time 3    Period Months    Status New    Target Date 11/20/20                 Plan - 09/11/20 1359    Clinical Impression  Statement Patient's distal limb pain is probably due to limb shrinkage then too few socks so taking too much weight on bottom of limb.  PT is recommending increasing ply socks or atleast checking 1-2 hours after morning wear.   PT recommend pads in socket when sees prosthetist on Wed, 3/17    Personal Factors and Comorbidities Comorbidity 3+;Fitness;Time since onset of injury/illness/exacerbation    Comorbidities HTN, CAD, MI 1/20, PAD, CHF, HFpEF, COPD, IDDM, CKD st2, OA, gout, hx alcohol abuse.    Examination-Activity Limitations Lift;Locomotion Level;Squat;Stairs;Stand;Transfers    Examination-Participation Restrictions Community Activity;Yard Work    [employer] Evolving/Moderate complexity    Rehab Potential Good    PT Frequency 2x / week    PT Duration 12 weeks    PT Treatment/Interventions ADLs/Self Care Home Management;DME Instruction;Gait training;Stair training;Functional mobility training;Therapeutic activities;Therapeutic exercise;Balance training;Neuromuscular re-education;Patient/family education;Prosthetic Training;Vestibular    PT Next Visit Plan review prosthetic care, instruct in HEP at sink, prosthetic gait with RW including ramp & curb    Consulted and Agree with Plan of Care Patient;Family member/caregiver    Family Member Consulted wife, Shamell Suarez           Patient will benefit from skilled therapeutic intervention in order to improve the following deficits and impairments:  Abnormal gait,Cardiopulmonary status limiting activity,Decreased activity tolerance,Decreased balance,Decreased endurance,Decreased knowledge of use of DME,Decreased mobility,Decreased strength,Dizziness,Increased edema,Impaired flexibility,Postural dysfunction,Prosthetic Dependency,Obesity  Visit Diagnosis: Other abnormalities of gait and mobility  Muscle weakness (generalized)  Abnormal posture  Unsteadiness on feet     Problem List Patient Active Problem List    Diagnosis Date Noted  . Urinary incontinence 08/10/2020  . Viral upper respiratory illness 07/13/2020  . Blurred vision, bilateral 07/13/2020  . Hyperglycemia 05/12/2020  . Increased urinary frequency 05/09/2020  . Dehiscence of amputation stump ([place])   . Coronary artery disease 01/18/2020  . AKI (acute kidney injury) ([place]) 01/17/2020  . Nocturnal leg cramps 10/28/2019  . Hyperlipidemia 05/26/2019  . Edema leg 12/29/2018  . STEMI involving left circumflex coronary artery ([place]) 07/12/2018  . Status post transmetatarsal amputation of foot, right ([place]) 07/08/2018  . Limited mobility 05/12/2018  . Gingival foreign body 07/22/2017  . Anxiety state 06/02/2017  . Leg swelling 01/29/2017  . Idiopathic chronic venous hypertension of both lower extremities with inflammation 09/24/2016  . Testicular mass 04/18/2016  . Subacute osteomyelitis, right ankle and foot ([place]) 01/29/2016  . Diabetic polyneuropathy associated with type 2 diabetes mellitus ([place])   . Type 2 diabetes mellitus with neurologic complication, with long-term current use of insulin ([place]) 12/08/2015  . Pulmonary nodule 02/01/2015  .  Depression 12/09/2013  . Warts, genital 08/20/2013  . Diabetic retinopathy ([place]) 01/28/2013  . Sleep apnea 09/06/2010  . 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 Essential hypertension 11/09/2008  . COPD, mild ([place]) 10/06/2006  . SICKLE-CELL TRAIT 04/26/2006  . ERECTILE DYSFUNCTION 04/26/2006  . Tobacco abuse 04/26/2006  . PAD (peripheral artery disease) ([place]) 04/26/2006  . ALLERGIC RHINITIS 04/26/2006    Jamey Reas, PT, DPT 09/11/2020, 3:45 PM  [facility] [address] [place], [place], [address] Phone: [phone]   Fax:  [phone]  Name: Adam Ray MRN: [id number] Date of Birth: [birth year]

## 2020-09-13 ENCOUNTER — Other Ambulatory Visit: Payer: Self-pay

## 2020-09-13 ENCOUNTER — Encounter: Payer: Self-pay | Admitting: Physical Therapy

## 2020-09-13 ENCOUNTER — Ambulatory Visit (INDEPENDENT_AMBULATORY_CARE_PROVIDER_SITE_OTHER): Payer: Medicare HMO | Admitting: Physical Therapy

## 2020-09-13 DIAGNOSIS — M6281 Muscle weakness (generalized): Secondary | ICD-10-CM | POA: Diagnosis not present

## 2020-09-13 DIAGNOSIS — R2681 Unsteadiness on feet: Secondary | ICD-10-CM | POA: Diagnosis not present

## 2020-09-13 DIAGNOSIS — R2689 Other abnormalities of gait and mobility: Secondary | ICD-10-CM | POA: Diagnosis not present

## 2020-09-13 DIAGNOSIS — R293 Abnormal posture: Secondary | ICD-10-CM | POA: Diagnosis not present

## 2020-09-13 NOTE — Patient Instructions (Signed)
Driving options with Below Knee Prosthesis   Option 1:  Remove prosthesis and use left leg to crossover drive Option 2:  2 Foot driving - right foot on gas & left foot on brake.  Prosthetic motion is leg press motion not ankle pump.  Focus is on heel pressing out not on toes/forefoot.   Option 3: Use prosthesis on both gas & brake.  Continue to focus on heel motion & position.  Option 4: Left foot accelerator.  Consider portable left foot accelerator ( http://plfa.org ) as allows to switch between cars or remove for other drivers.  Left foot both gas & brake.  Using cruise control to accelerate & decelerate or resume speed after stopping can safe some leg work / motion.   Practice initially in large empty parking lot. Stay in middle not near curbs. Practice turning & backing into parking spot on right & left. Parallel park.  Back up.  Quick stops. More about learning foot work not staying in motion.   

## 2020-09-13 NOTE — Therapy (Signed)
[facility] Physical Therapy [address] [place], [place], [address] Phone: [phone]   Fax:  [phone]  Physical Therapy Treatment  Patient Details  Name: Adam Ray MRN: [id number] Date of Birth: 1956-04-16 Referring Provider (PT): Adam Ray, [place]   Encounter Date: 09/13/2020   PT End of Session - 09/13/20 1532    Visit Number 4    Number of Visits 26    Date for PT Re-Evaluation 11/20/20    Authorization Type UHC Medicare    Progress Note Due on Visit 10    PT Start Time 1528    PT Stop Time 1608    PT Time Calculation (min) 40 min    Activity Tolerance Patient tolerated treatment well    Behavior During Therapy [facility] for tasks assessed/performed           Past Medical History:  Diagnosis Date  . Allergy   . Arthritis   . Bronchitis   . Cataract   . CHF (congestive heart failure) ([place])   . Chronic kidney disease   . Claudication [facility])    right foot ray resection  . Colon polyps    hyperplastic  . COPD (chronic obstructive pulmonary disease) ([place])   . Coronary artery disease   . Diabetes mellitus    type II  . Genital warts   . Gout   . Hyperlipidemia   . Hypertension   . Myocardial infarction ([place])   . Osteomyelitis of third toe of right foot ([place])   . Pneumonia   . Status post amputation of toe of right foot ([place]) 09/24/2016    Past Surgical History:  Procedure Laterality Date  . AMPUTATION Right 01/31/2016   Procedure: Right 2nd Toe Amputation;  Surgeon: Newt Minion, MD;  Location: [place];  Service: Orthopedics;  Laterality: Right;  . AMPUTATION Right 07/08/2018   Procedure: RIGHT TRANSMETATARSAL AMPUTATION;  Surgeon: Newt Minion, MD;  Location: [place];  Service: Orthopedics;  Laterality: Right;  . AMPUTATION Right 01/05/2020   Procedure: RIGHT BELOW KNEE AMPUTATION;  Surgeon: Newt Minion, MD;  Location: [place];  Service: Orthopedics;  Laterality: Right;  . AMPUTATION Right 02/23/2020   Procedure: RIGHT BELOW KNEE  AMPUTATION REVISION;  Surgeon: Newt Minion, MD;  Location: [place];  Service: Orthopedics;  Laterality: Right;  . CATARACT EXTRACTION     right eye  . COLONOSCOPY    . CORONARY STENT INTERVENTION N/A 07/12/2018   Procedure: CORONARY STENT INTERVENTION;  Surgeon: [place] Sine, MD;  Location: [place] CV LAB;  Service: Cardiovascular;  Laterality: N/A;  . CORONARY/GRAFT ACUTE MI REVASCULARIZATION N/A 07/12/2018   Procedure: Coronary/Graft Acute MI Revascularization;  Surgeon: [place] Sine, MD;  Location: [place] CV LAB;  Service: Cardiovascular;  Laterality: N/A;  . I & D EXTREMITY  04/11/2012   Procedure: IRRIGATION AND DEBRIDEMENT EXTREMITY;  Surgeon: Wylene Simmer, MD;  Location: [place];  Service: Orthopedics;  Laterality: Right;  . LEFT HEART CATH AND CORONARY ANGIOGRAPHY N/A 07/12/2018   Procedure: LEFT HEART CATH AND CORONARY ANGIOGRAPHY;  Surgeon: [place] Sine, MD;  Location: [place] CV LAB;  Service: Cardiovascular;  Laterality: N/A;  . Surgery left great toe    . Tear ducts bilateral eyes    . TRANSMETATARSAL AMPUTATION Right 07/08/2018    There were no vitals filed for this visit.   Subjective Assessment - 09/13/20 1530    Subjective He wants to know how to drive with prosthesis. He just left  prosthetist office who put pads in socket & made aligment changes.  "It feels better."    Patient is accompained by: Family member   wife, Adam Ray   Pertinent History HTN, CAD, MI 1/20, PAD, CHF, HFpEF, COPD, IDDM, CKD st2, OA, gout, hx alcohol abuse.    Patient Stated Goals To use prosthesis to walk in community & drive    Currently in Pain? No/denies    Pain Onset 1 to 4 weeks ago                             [facility] Adult PT Treatment/Exercise - 09/13/20 1528      Transfers   Transfers Sit to Stand;Stand to Sit    Sit to Stand 5: Supervision;With upper extremity assist;With armrests;From chair/3-in-1;Other (comment)   requires RW to stabilize   Sit  to Stand Details Visual cues for safe use of DME/AE;Verbal cues for safe use of DME/AE;Verbal cues for technique    Stand to Sit 5: Supervision;With upper extremity assist;With armrests;To chair/3-in-1;Other (comment)   RW for stabilization   Stand to Sit Details (indicate cue type and reason) Visual cues for safe use of DME/AE;Verbal cues for safe use of DME/AE;Verbal cues for technique      Ambulation/Gait   Ambulation/Gait Yes    Ambulation/Gait Assistance 5: Supervision    Ambulation Distance (Feet) 150 Feet    Assistive device Rolling walker;Prosthesis    Gait Pattern Step-to pattern;Decreased stride length;Decreased step length - left;Decreased stance time - right;Right circumduction;Right hip hike;Decreased weight shift to right;Right flexed knee in stance;Left flexed knee in stance;Antalgic;Lateral hip instability;Trunk flexed;Abducted- right    Stairs --    Stairs Assistance --    Stair Management Technique --    Number of Stairs --    Height of Stairs --    Ramp 4: Min assist   RW & prosthesis   Curb 4: Min assist   min guard with RW & prosthesis     Self-Care   Self-Care ADL's    ADL's donning & doffing jacket standing without UE support with supervision      Exercises   Exercises Knee/Hip      Knee/Hip Exercises: Aerobic   Nustep seat 13 with BLEs & BUEs level 7 for 8 min      Knee/Hip Exercises: Seated   Sit to Sand 1 set;10 reps;without UE support   from 24" bar stool     Prosthetics   Prosthetic Care Comments  driving with Right TTA see pt instructions    Current prosthetic wear tolerance (days/week)  daily    Current prosthetic wear tolerance (#hours/day)  most of awake hours except 1-2 hrs in morning & end of day.    Current prosthetic weight-bearing tolerance (hours/day)  Patient tolerated standing for 5 minutes with partial weight on prosthesis without c/o pain or discomfort    Edema pitting with 5-7 sec capillary refill    Residual limb condition  distal limb  appears pre-blister from sweat in liner, limb was sweaty when prosthesis doffed, no open areas, dry skin,  no hair growth, normal color & termperature, shape cylinderical    Education Provided Skin check;Residual limb care;Prosthetic cleaning;Correct ply sock adjustment;Proper Donning;Proper wear schedule/adjustment;Other (comment)   see prosthetic care comments   Person(s) Educated Patient;Spouse    Education Method Explanation;Demonstration;Tactile cues;Handout    Education Method Verbalized understanding;Verbal cues required;Needs further instruction  PT Short Term Goals - 08/23/20 2130      PT SHORT TERM GOAL #1   Title Patient demonstrates proper donning & verbalizes proper cleaning of prosthesis    Time 1    Period Months    Status New    Target Date 09/20/20      PT SHORT TERM GOAL #2   Title Pateint tolerates prosthesis wear >/= 12 houes without skin issues.    Time 1    Period Months    Status New    Target Date 09/20/20      PT SHORT TERM GOAL #3   Title Berg Balance >= 25 / 56    Time 1    Period Months    Status New    Target Date 09/20/20      PT SHORT TERM GOAL #4   Title Patient ambulates 300' with RW & prosthesis with verbal cues for deviations no safety issues with supervision.    Time 1    Period Months    Status New    Target Date 09/20/20      PT SHORT TERM GOAL #5   Title Patient negotiates ramps & curbs with RW & prosthesis with minA    Time 1    Period Months    Status New    Target Date 09/20/20             PT Long Term Goals - 08/23/20 2126      PT LONG TERM GOAL #1   Title Pateint demonstrates & verbalizes proper prosthetic care to enable safe utilization of prosthesis.    Time 3    Period Months    Status New    Target Date 11/20/20      PT LONG TERM GOAL #2   Title Patient tolerates wear of prosthesis >90% of awake hours without skin or limb pain issues to enable function during his day.    Time 3     Period Months    Status New    Target Date 11/20/20      PT LONG TERM GOAL #3   Title Berg Balance >/= 45/56    Time 3    Period Months    Status New    Target Date 11/20/20      PT LONG TERM GOAL #4   Title Pateint ambulates with prosthesis with cane or less 500' modified independent.    Time 3    Period Months    Status New    Target Date 11/20/20      PT LONG TERM GOAL #5   Title Patient negotiates ramps, curbs and stairs with single rail with cane or less and prosthesis modified independent.    Time 3    Period Months    Status New    Target Date 11/20/20                 Plan - 09/13/20 1532    Clinical Impression Statement Per patient request, PT instructed in driving options with right TTA.  His wife & he appear to have a general understanding. However patient will have to be able to pass vision test at [facility] before any of 4 options presented are possible.    Personal Factors and Comorbidities Comorbidity 3+;Fitness;Time since onset of injury/illness/exacerbation    Comorbidities HTN, CAD, MI 1/20, PAD, CHF, HFpEF, COPD, IDDM, CKD st2, OA, gout, hx alcohol abuse.    Examination-Activity Limitations Lift;Locomotion Level;Squat;Stairs;Stand;Transfers    Examination-Participation  Restrictions Community Activity;Yard Work    Merchant navy officer Evolving/Moderate complexity    Rehab Potential Good    PT Frequency 2x / week    PT Duration 12 weeks    PT Treatment/Interventions ADLs/Self Care Home Management;DME Instruction;Gait training;Stair training;Functional mobility training;Therapeutic activities;Therapeutic exercise;Balance training;Neuromuscular re-education;Patient/family education;Prosthetic Training;Vestibular    PT Next Visit Plan check STGs, review prosthetic care,  prosthetic gait with RW including ramp & curb    Consulted and Agree with Plan of Care Patient;Family member/caregiver    Family Member Consulted wife, Sair Faulcon            Patient will benefit from skilled therapeutic intervention in order to improve the following deficits and impairments:  Abnormal gait,Cardiopulmonary status limiting activity,Decreased activity tolerance,Decreased balance,Decreased endurance,Decreased knowledge of use of DME,Decreased mobility,Decreased strength,Dizziness,Increased edema,Impaired flexibility,Postural dysfunction,Prosthetic Dependency,Obesity  Visit Diagnosis: Other abnormalities of gait and mobility  Muscle weakness (generalized)  Abnormal posture  Unsteadiness on feet     Problem List Patient Active Problem List   Diagnosis Date Noted  . Urinary incontinence 08/10/2020  . Viral upper respiratory illness 07/13/2020  . Blurred vision, bilateral 07/13/2020  . Hyperglycemia 05/12/2020  . Increased urinary frequency 05/09/2020  . Dehiscence of amputation stump ([place])   . Coronary artery disease 01/18/2020  . AKI (acute kidney injury) ([place]) 01/17/2020  . Nocturnal leg cramps 10/28/2019  . Hyperlipidemia 05/26/2019  . Edema leg 12/29/2018  . STEMI involving left circumflex coronary artery ([place]) 07/12/2018  . Status post transmetatarsal amputation of foot, right ([place]) 07/08/2018  . Limited mobility 05/12/2018  . Gingival foreign body 07/22/2017  . Anxiety state 06/02/2017  . Leg swelling 01/29/2017  . Idiopathic chronic venous hypertension of both lower extremities with inflammation 09/24/2016  . Testicular mass 04/18/2016  . Subacute osteomyelitis, right ankle and foot ([place]) 01/29/2016  . Diabetic polyneuropathy associated with type 2 diabetes mellitus ([place])   . Type 2 diabetes mellitus with neurologic complication, with long-term current use of insulin ([place]) 12/08/2015  . Pulmonary nodule 02/01/2015  . Depression 12/09/2013  . Warts, genital 08/20/2013  . Diabetic retinopathy ([place]) 01/28/2013  . Sleep apnea 09/06/2010  . 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 Essential hypertension 11/09/2008  . COPD, mild  ([place]) 10/06/2006  . SICKLE-CELL TRAIT 04/26/2006  . ERECTILE DYSFUNCTION 04/26/2006  . Tobacco abuse 04/26/2006  . PAD (peripheral artery disease) ([place]) 04/26/2006  . ALLERGIC RHINITIS 04/26/2006    Jamey Reas, PT, DPT 09/13/2020, 4:17 PM  [facility] Physical Therapy [address] [place], [place], [address] Phone: [phone]   Fax:  [phone]  Name: Adam Ray MRN: [id number] Date of Birth: 1956-02-06

## 2020-09-14 ENCOUNTER — Other Ambulatory Visit: Payer: Self-pay

## 2020-09-14 ENCOUNTER — Ambulatory Visit (INDEPENDENT_AMBULATORY_CARE_PROVIDER_SITE_OTHER): Payer: Medicare HMO | Admitting: Family Medicine

## 2020-09-14 ENCOUNTER — Encounter: Payer: Self-pay | Admitting: Family Medicine

## 2020-09-14 VITALS — BP 126/60 | HR 61 | Ht 70.0 in | Wt 169.8 lb

## 2020-09-14 DIAGNOSIS — Z794 Long term (current) use of insulin: Secondary | ICD-10-CM

## 2020-09-14 DIAGNOSIS — R32 Unspecified urinary incontinence: Secondary | ICD-10-CM | POA: Diagnosis not present

## 2020-09-14 DIAGNOSIS — Z748 Other problems related to care provider dependency: Secondary | ICD-10-CM

## 2020-09-14 DIAGNOSIS — Z1211 Encounter for screening for malignant neoplasm of colon: Secondary | ICD-10-CM | POA: Diagnosis not present

## 2020-09-14 DIAGNOSIS — E1141 Type 2 diabetes mellitus with diabetic mononeuropathy: Secondary | ICD-10-CM

## 2020-09-14 NOTE — Patient Instructions (Addendum)
It was great to see you again today!  Referring to GI to discuss early colonoscopy Also referring to urology for your urine issues.  Go down to 55 units of lantus twice daily Go up to 35 units of novolog three times daily with meals  Will have someone reach out about transportation resources.  Call with any questions.  Follow up with me in 1 month for your diabetes   Be well, Dr. [name]

## 2020-09-14 NOTE — Progress Notes (Signed)
  Date of Visit: 09/14/2020   SUBJECTIVE:   HPI:  Adam Ray presents today for follow up of diabetes.  Vision issues - still a problem, has not seen ophtho here in [place]. After his last visit I spoke with Dr. Coralyn Pear who advised he see a general ophthalmologist in town if he is not able to follow up with his optho at [facility]. Patient would very much prefer to see ophthalmology at [facility] but struggles with transportation to [facility]. He is agreeable to me reaching out to social work/care management about transportation assistance.  Diabetes - currently taking lantus 60u twice daily and novolog 30u three times daily with meals. Lowest sugar is in high 60s. Later in day is much higher, in 300s.  Urinary issues - never saw urology (appears referral was not put in). Is not taking flomax.  Colon cancer screening - desires another colonoscopy even though he is not yet due. Agreeable to referral to discuss more.   OBJECTIVE:   BP 126/60   Pulse 61   Ht 5\' 10"  (1.778 m)   Wt 169 lb 12.8 oz (77 kg)   SpO2 97%   BMI 24.36 kg/m  Gen: no acute distress, pleasant cooperative HEENT: normocephalic, atraumatic  Heart: regular rate and rhythm, no murmur Lungs: clear to auscultation bilaterally, normal work of breathing  Neuro:  Alert, grossly nonfocal, speech normal  ASSESSMENT/PLAN:   Health maintenance:  -refer to GI to discuss early repeat colonoscopy by patient preference  Type 2 diabetes mellitus with neurologic complication, with long-term current use of insulin (HCC) Low sugar in 60s - suggests need to lower basal insulin. Change to lantus 55u twice daily Increase mealtime coverage to 35u three times daily - should help with peaks after meals Follow up in 1 month   Urinary incontinence Refer to urology  Vision issues Referral to CCM for transportation assistance - patient would be best served seeing [facility] if at all possible  FOLLOW UP: Follow up in 1 mo for above issues Referring to  GI, urology & care mgmt   [name], [place]

## 2020-09-17 NOTE — Assessment & Plan Note (Signed)
Refer to urology.  ?

## 2020-09-17 NOTE — Assessment & Plan Note (Signed)
Low sugar in 60s - suggests need to lower basal insulin. Change to lantus 55u twice daily Increase mealtime coverage to 35u three times daily - should help with peaks after meals Follow up in 1 month

## 2020-09-18 ENCOUNTER — Encounter: Payer: No Typology Code available for payment source | Admitting: Physical Therapy

## 2020-09-18 ENCOUNTER — Encounter: Payer: Self-pay | Admitting: Orthopedic Surgery

## 2020-09-18 ENCOUNTER — Ambulatory Visit: Payer: No Typology Code available for payment source | Admitting: Orthopedic Surgery

## 2020-09-18 NOTE — Progress Notes (Signed)
Office Visit Note   Patient: Adam Ray           Date of Birth: 02-13-1956           MRN: [id number] Visit Date: 09/04/2020              Requested by: [name], [place],  [place] [id number] PCP: [name], MD  Chief Complaint  Patient presents with  . Left Foot - Follow-up      HPI: Patient is a 65 year old gentleman who presents for follow-up for his left foot he is status post a right transtibial amputation he uses a walker.  Patient has pushed down the compression wrap on the left leg and has massive swelling proximally.  Assessment & Plan: Visit Diagnoses:  1. Acquired absence of right leg below knee ([place])   2. Idiopathic chronic venous hypertension of both lower extremities with inflammation     Plan: Recommend patient proceed with the knee-high compression stocking daily.  Follow-Up Instructions: Return in about 2 weeks (around 09/18/2020).   Ortho Exam  Patient is alert, oriented, no adenopathy, well-dressed, normal affect, normal respiratory effort. Examination patient cut off the compression wrap this has been pushed down in the proximal two thirds of his calf shows significant pitting edema.  His calf measures 42 cm in circumference.  There are no plantar ulcers he has a palpable anterior tibial pulse.  Patient's current calf would place him in a size extra-large sock.  Imaging: No results found. No images are attached to the encounter.  Labs: Lab Results  Component Value Date   HGBA1C 11.3 (A) 08/10/2020   HGBA1C 11.5 (A) 05/08/2020   HGBA1C 8.7 (H) 01/19/2020   ESRSEDRATE 54 (H) 01/01/2020   ESRSEDRATE 8 01/29/2016   ESRSEDRATE 9 04/11/2012   CRP 2.6 (H) 01/29/2016   CRP 0.5 02/28/2014   REPTSTATUS 01/18/2020 FINAL 01/17/2020   REPTSTATUS 01/22/2020 FINAL 01/17/2020   REPTSTATUS 01/22/2020 FINAL 01/17/2020   GRAMSTAIN  04/11/2012    NO WBC SEEN RARE SQUAMOUS EPITHELIAL CELLS PRESENT MODERATE GRAM  POSITIVE COCCI IN PAIRS RARE GRAM NEGATIVE RODS   GRAMSTAIN  04/11/2012    NO WBC SEEN RARE SQUAMOUS EPITHELIAL CELLS PRESENT MODERATE GRAM POSITIVE COCCI IN PAIRS RARE GRAM NEGATIVE RODS   CULT (A) 01/17/2020    <10,000 COLONIES/mL INSIGNIFICANT GROWTH Performed at [facility], [address] [place], [place] [address]    CULT  01/17/2020    NO GROWTH 5 DAYS Performed at [facility] [address], [place], [place] [address]    CULT  01/17/2020    NO GROWTH 5 DAYS Performed at [facility] [address], [place], [place] [address]    LABORGA NO GROWTH 2 DAYS 07/19/2014     Lab Results  Component Value Date   ALBUMIN 3.7 02/23/2020   ALBUMIN 3.0 (L) 01/18/2020   ALBUMIN 3.4 (L) 01/17/2020    No results found for: MG No results found for: VD25OH  No results found for: PREALBUMIN CBC EXTENDED Latest Ref Rng & Units 02/23/2020 01/27/2020 01/19/2020  WBC 4.0 - 10.5 K/uL 3.9(L) 3.1(L) 3.2(L)  RBC 4.22 - 5.81 MIL/uL 3.97(L) 3.52(L) 3.42(L)  HGB 13.0 - 17.0 g/dL 11.6(L) 10.5(L) 9.9(L)  HCT 39.0 - 52.0 % 35.9(L) 31.2(L) 30.4(L)  PLT 150 - 400 K/uL 178 132(L) 167  NEUTROABS 1.7 - 7.7 K/uL - - -  LYMPHSABS 0.7 - 4.0 K/uL - - -  Body mass index is 24.11 kg/m.  Orders:  No orders of the defined types were placed in this encounter.  No orders of the defined types were placed in this encounter.    Procedures: No procedures performed  Clinical Data: No additional findings.  ROS:  All other systems negative, except as noted in the HPI. Review of Systems  Objective: Vital Signs: Ht 5\' 10"  (1.778 m)   Wt 168 lb (76.2 kg)   BMI 24.11 kg/m   Specialty Comments:  No specialty comments available.  PMFS History: Patient Active Problem List   Diagnosis Date Noted  . Urinary incontinence 08/10/2020  . Viral upper respiratory illness 07/13/2020  . Blurred vision, bilateral 07/13/2020  . Hyperglycemia 05/12/2020  . Increased urinary  frequency 05/09/2020  . Dehiscence of amputation stump ([place])   . Coronary artery disease 01/18/2020  . AKI (acute kidney injury) ([place]) 01/17/2020  . Nocturnal leg cramps 10/28/2019  . Hyperlipidemia 05/26/2019  . Edema leg 12/29/2018  . STEMI involving left circumflex coronary artery ([place]) 07/12/2018  . Status post transmetatarsal amputation of foot, right ([place]) 07/08/2018  . Limited mobility 05/12/2018  . Gingival foreign body 07/22/2017  . Anxiety state 06/02/2017  . Leg swelling 01/29/2017  . Idiopathic chronic venous hypertension of both lower extremities with inflammation 09/24/2016  . Testicular mass 04/18/2016  . Subacute osteomyelitis, right ankle and foot ([place]) 01/29/2016  . Diabetic polyneuropathy associated with type 2 diabetes mellitus ([place])   . Type 2 diabetes mellitus with neurologic complication, with long-term current use of insulin ([place]) 12/08/2015  . Pulmonary nodule 02/01/2015  . Depression 12/09/2013  . Warts, genital 08/20/2013  . Diabetic retinopathy ([place]) 01/28/2013  . Sleep apnea 09/06/2010  . 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 Essential hypertension 11/09/2008  . COPD, mild ([place]) 10/06/2006  . SICKLE-CELL TRAIT 04/26/2006  . ERECTILE DYSFUNCTION 04/26/2006  . Tobacco abuse 04/26/2006  . PAD (peripheral artery disease) ([place]) 04/26/2006  . ALLERGIC RHINITIS 04/26/2006   Past Medical History:  Diagnosis Date  . Allergy   . Arthritis   . Bronchitis   . Cataract   . CHF (congestive heart failure) ([place])   . Chronic kidney disease   . Claudication [facility])    right foot ray resection  . Colon polyps    hyperplastic  . COPD (chronic obstructive pulmonary disease) ([place])   . Coronary artery disease   . Diabetes mellitus    type II  . Genital warts   . Gout   . Hyperlipidemia   . Hypertension   . Myocardial infarction ([place])   . Osteomyelitis of third toe of right foot ([place])   . Pneumonia   . Status post amputation of toe of right foot ([place]) 09/24/2016     Family History  Problem Relation Age of Onset  . Diabetes Mother   . Stroke Mother   . Heart failure Father   . Colon cancer Neg Hx   . Esophageal cancer Neg Hx   . Rectal cancer Neg Hx   . Stomach cancer Neg Hx     Past Surgical History:  Procedure Laterality Date  . AMPUTATION Right 01/31/2016   Procedure: Right 2nd Toe Amputation;  Surgeon: Newt Minion, MD;  Location: [place];  Service: Orthopedics;  Laterality: Right;  . AMPUTATION Right 07/08/2018   Procedure: RIGHT TRANSMETATARSAL AMPUTATION;  Surgeon: Newt Minion, MD;  Location: [place];  Service: Orthopedics;  Laterality: Right;  . AMPUTATION Right 01/05/2020   Procedure: RIGHT BELOW  KNEE AMPUTATION;  Surgeon: Newt Minion, MD;  Location: [place];  Service: Orthopedics;  Laterality: Right;  . AMPUTATION Right 02/23/2020   Procedure: RIGHT BELOW KNEE AMPUTATION REVISION;  Surgeon: Newt Minion, MD;  Location: [place];  Service: Orthopedics;  Laterality: Right;  . CATARACT EXTRACTION     right eye  . COLONOSCOPY    . CORONARY STENT INTERVENTION N/A 07/12/2018   Procedure: CORONARY STENT INTERVENTION;  Surgeon: [place] Sine, MD;  Location: [place] CV LAB;  Service: Cardiovascular;  Laterality: N/A;  . CORONARY/GRAFT ACUTE MI REVASCULARIZATION N/A 07/12/2018   Procedure: Coronary/Graft Acute MI Revascularization;  Surgeon: [place] Sine, MD;  Location: [place] CV LAB;  Service: Cardiovascular;  Laterality: N/A;  . I & D EXTREMITY  04/11/2012   Procedure: IRRIGATION AND DEBRIDEMENT EXTREMITY;  Surgeon: Wylene Simmer, MD;  Location: [place];  Service: Orthopedics;  Laterality: Right;  . LEFT HEART CATH AND CORONARY ANGIOGRAPHY N/A 07/12/2018   Procedure: LEFT HEART CATH AND CORONARY ANGIOGRAPHY;  Surgeon: Troy Sine, MD;  Location: [place] CV LAB;  Service: Cardiovascular;  Laterality: N/A;  . Surgery left great toe    . Tear ducts bilateral eyes    . TRANSMETATARSAL AMPUTATION Right 07/08/2018   Social History    Occupational History  . Occupation: disabled  Tobacco Use  . Smoking status: Former Smoker    Packs/day: 0.30    Years: 48.00    Pack years: 14.40    Types: Cigars, Cigarettes    Start date: 07/02/1963    Quit date: 06/01/2019    Years since quitting: 1.3  . Smokeless tobacco: Former Network engineer  . Vaping Use: Former  Substance and Sexual Activity  . Alcohol use: Not Currently    Alcohol/week: 0.0 standard drinks  . Drug use: No  . Sexual activity: Not on file

## 2020-09-20 ENCOUNTER — Telehealth: Payer: Self-pay | Admitting: Family Medicine

## 2020-09-20 ENCOUNTER — Encounter: Payer: No Typology Code available for payment source | Admitting: Physical Therapy

## 2020-09-20 NOTE — Telephone Encounter (Signed)
   Telephone encounter was:  Unsuccessful.  09/20/2020 Name: OLIVERIO CHO MRN: [id number] DOB: 13-Mar-1956  Unsuccessful outbound call made today to assist with:  Transportation Needs   Outreach Attempt:  1st Attempt  Attempted to call patient regarding referral, but no answer. Voicemail box is full and could not leave a message.   [place], Care Management Phone: [phone] Email: [email or web address]

## 2020-09-25 ENCOUNTER — Encounter: Payer: Self-pay | Admitting: Physical Therapy

## 2020-09-25 ENCOUNTER — Encounter: Payer: No Typology Code available for payment source | Admitting: Physical Therapy

## 2020-09-25 ENCOUNTER — Ambulatory Visit (INDEPENDENT_AMBULATORY_CARE_PROVIDER_SITE_OTHER): Payer: Medicare HMO | Admitting: Physical Therapy

## 2020-09-25 ENCOUNTER — Other Ambulatory Visit: Payer: Self-pay

## 2020-09-25 DIAGNOSIS — R293 Abnormal posture: Secondary | ICD-10-CM | POA: Diagnosis not present

## 2020-09-25 DIAGNOSIS — R2689 Other abnormalities of gait and mobility: Secondary | ICD-10-CM | POA: Diagnosis not present

## 2020-09-25 DIAGNOSIS — M6281 Muscle weakness (generalized): Secondary | ICD-10-CM

## 2020-09-25 DIAGNOSIS — R2681 Unsteadiness on feet: Secondary | ICD-10-CM

## 2020-09-25 NOTE — Therapy (Signed)
[facility] [address] [place], [place], [address] Phone: [phone]   Fax:  [phone]  Physical Therapy Treatment  Patient Details  Name: Adam Ray MRN: [id number] Date of Birth: 07/08/1955 Referring Provider (PT): Adam Anne Persons, PA   Encounter Date: 09/25/2020   PT End of Session - 09/25/20 1354    Visit Number 5    Number of Visits 26    Date for PT Re-Evaluation 11/20/20    Authorization Type UHC Medicare    Progress Note Due on Visit 10    PT Start Time 1347    PT Stop Time 1430    PT Time Calculation (min) 43 min    Equipment Utilized During Treatment Gait belt    Activity Tolerance Patient tolerated treatment well    Behavior During Therapy WFL for tasks assessed/performed           Past Medical History:  Diagnosis Date  . Allergy   . Arthritis   . Bronchitis   . Cataract   . CHF (congestive heart failure) (HCC)   . Chronic kidney disease   . Claudication (HCC)    right foot ray resection  . Colon polyps    hyperplastic  . COPD (chronic obstructive pulmonary disease) (HCC)   . Coronary artery disease   . Diabetes mellitus    type II  . Genital warts   . Gout   . Hyperlipidemia   . Hypertension   . Myocardial infarction (HCC)   . Osteomyelitis of third toe of right foot (HCC)   . Pneumonia   . Status post amputation of toe of right foot (HCC) 09/24/2016    Past Surgical History:  Procedure Laterality Date  . AMPUTATION Right 01/31/2016   Procedure: Right 2nd Toe Amputation;  Surgeon: Marcus V Duda, MD;  [facility];  Service: Orthopedics;  Laterality: Right;  . AMPUTATION Right 07/08/2018   Procedure: RIGHT TRANSMETATARSAL AMPUTATION;  Surgeon: Duda, Marcus V, MD;  [facility];  Service: Orthopedics;  Laterality: Right;  . AMPUTATION Right 01/05/2020   Procedure: RIGHT BELOW KNEE AMPUTATION;  Surgeon: Duda, Marcus V, MD;  [facility];  Service: Orthopedics;  Laterality: Right;  . AMPUTATION  Right 02/23/2020   Procedure: RIGHT BELOW KNEE AMPUTATION REVISION;  Surgeon: Duda, Marcus V, MD;  [facility];  Service: Orthopedics;  Laterality: Right;  . CATARACT EXTRACTION     right eye  . COLONOSCOPY    . CORONARY STENT INTERVENTION N/A 07/12/2018   Procedure: CORONARY STENT INTERVENTION;  Surgeon: Kelly, Thomas A, MD;  [facility];  Service: Cardiovascular;  Laterality: N/A;  . CORONARY/GRAFT ACUTE MI REVASCULARIZATION N/A 07/12/2018   Procedure: Coronary/Graft Acute MI Revascularization;  Surgeon: Kelly, Thomas A, MD;  [facility];  Service: Cardiovascular;  Laterality: N/A;  . I & D EXTREMITY  04/11/2012   Procedure: IRRIGATION AND DEBRIDEMENT EXTREMITY;  Surgeon: John Hewitt, MD;  [facility];  Service: Orthopedics;  Laterality: Right;  . LEFT HEART CATH AND CORONARY ANGIOGRAPHY N/A 07/12/2018   Procedure: LEFT HEART CATH AND CORONARY ANGIOGRAPHY;  Surgeon: Kelly, Thomas A, MD;  [facility];  Service: Cardiovascular;  Laterality: N/A;  . Surgery left great toe    . Tear ducts bilateral eyes    . TRANSMETATARSAL AMPUTATION Right 07/08/2018    There were no vitals filed for this visit.   Subjective Assessment - 09/25/20 1347    Subjective He is wearing   prosthesis from arising to bedtime. Patient brought rollator walker that a friend gave him.    Patient is accompained by: Family member   wife, Adam Ray   Pertinent History HTN, CAD, MI 1/20, PAD, CHF, HFpEF, COPD, IDDM, CKD st2, OA, gout, hx alcohol abuse.    Patient Stated Goals To use prosthesis to walk in community & drive    Currently in Pain? No/denies    Pain Onset 1 to 4 weeks ago                             [facility] Adult PT Treatment/Exercise - 09/25/20 1347      Transfers   Transfers Sit to Stand;Stand to [employer] Transfers    Sit to Stand 5: Supervision;With upper extremity assist;With armrests;From chair/3-in-1;Other (comment)   to  rollator walker   Sit to Stand Details Visual cues for safe use of DME/AE;Verbal cues for safe use of DME/AE;Verbal cues for technique    Stand to Sit 5: Supervision;With upper extremity assist;With armrests;To chair/3-in-1;Other (comment)   from locked rollator walker   Stand to Sit Details (indicate cue type and reason) Visual cues for safe use of DME/AE;Verbal cues for safe use of DME/AE;Verbal cues for technique    Stand Pivot Transfers 5: Supervision;With armrests   turning 180* sit / stand from rollator walker seat   Stand Pivot Transfer Details (indicate cue type and reason) PT demo & verbal cues on technique with rollator walker      Ambulation/Gait   Ambulation/Gait Yes    Ambulation/Gait Assistance 5: Supervision    Ambulation/Gait Assistance Details PT demo & verbal cues on technique & safety on rollator walker use.    Ambulation Distance (Feet) 150 Feet   150' X 2   Assistive device Prosthesis;Rollator    Gait Pattern --    Ambulation Surface Level;Indoor    Ramp 5: Supervision   rollator walker & prosthesis   Ramp Details (indicate cue type and reason) demo & verbal cues on technique with rollator walker    Curb 5: Supervision   rollator walker & prosthesis   Curb Details (indicate cue type and reason) demo & verbal cues on technique with rollator walker      Self-Care   Self-Care --    ADL's --      Exercises   Exercises Knee/Hip      Knee/Hip Exercises: Aerobic   Nustep --      Knee/Hip Exercises: Seated   Sit to Sand 1 set;10 reps;without UE support   from 24" bar stool     Prosthetics   Prosthetic Care Comments  Pt using piece of cardboard (not appropriate) to pad anterior area of socket.  PT instructed why padding socket changes proper tolerant areas.  PT set up appt with prosthetist Thursday, 3/31 at 3:00pm.    Current prosthetic wear tolerance (days/week)  daily    Current prosthetic wear tolerance (#hours/day)  most of awake hours except 1-2 hrs in morning &  end of day.    Current prosthetic weight-bearing tolerance (hours/day)  Patient tolerated standing for 5 minutes with partial weight on prosthesis without c/o pain or discomfort    Edema pitting with 5-7 sec capillary refill    Residual limb condition  distal limb appears pre-blister from sweat in liner, limb was sweaty when prosthesis doffed, no open areas, dry skin,  no hair growth, normal color & termperature, shape cylinderical  Education Provided Skin check;Residual limb care;Prosthetic cleaning;Correct ply sock adjustment;Proper Donning;Proper wear schedule/adjustment;Other (comment)   see prosthetic care comments   Person(s) Educated Patient;Spouse    Education Method Explanation;Verbal cues    Education Method Verbalized understanding;Verbal cues required;Needs further instruction    Donning Prosthesis Supervision                    PT Short Term Goals - 09/25/20 1738      PT SHORT TERM GOAL #1   Title Patient demonstrates proper donning & verbalizes proper cleaning of prosthesis    Baseline Partially MET 09/25/2020  He is able to properly donne prosthesis but is inappropriately padding socket.    Time 1    Period Months    Status Partially Met    Target Date 09/20/20      PT SHORT TERM GOAL #2   Title Pateint tolerates prosthesis wear >/= 12 houes without skin issues.    Baseline MET 09/25/2020    Time 1    Period Months    Status Achieved    Target Date 09/20/20      PT SHORT TERM GOAL #3   Title Berg Balance >= 25 / 56    Time 1    Period Months    Status On-going    Target Date 09/20/20      PT SHORT TERM GOAL #4   Title Patient ambulates 300' with RW & prosthesis with verbal cues for deviations no safety issues with supervision.    Baseline MET 09/25/2020    Time 1    Period Months    Status Achieved    Target Date 09/20/20      PT SHORT TERM GOAL #5   Title Patient negotiates ramps & curbs with RW & prosthesis with minA    Baseline MET 09/25/2020     Time 1    Period Months    Status Achieved    Target Date 09/20/20             PT Long Term Goals - 09/25/20 1740      PT LONG TERM GOAL #1   Title Pateint demonstrates & verbalizes proper prosthetic care to enable safe utilization of prosthesis.    Time 3    Period Months    Status On-going    Target Date 11/20/20      PT LONG TERM GOAL #2   Title Patient tolerates wear of prosthesis >90% of awake hours without skin or limb pain issues to enable function during his day.    Time 3    Period Months    Status On-going    Target Date 11/20/20      PT LONG TERM GOAL #3   Title Berg Balance >/= 45/56    Time 3    Period Months    Status On-going    Target Date 11/20/20      PT LONG TERM GOAL #4   Title Pateint ambulates with prosthesis with cane or less 500' modified independent.    Time 3    Period Months    Status On-going    Target Date 11/20/20      PT LONG TERM GOAL #5   Title Patient negotiates ramps, curbs and stairs with single rail with cane or less and prosthesis modified independent.    Time 3    Period Months    Status On-going    Target Date 11/20/20                   Plan - 09/25/20 1354    Clinical Impression Statement PT educated patient on safety & use of newly acquired rollator walker and he appears to understand.    Personal Factors and Comorbidities Comorbidity 3+;Fitness;Time since onset of injury/illness/exacerbation    Comorbidities HTN, CAD, MI 1/20, PAD, CHF, HFpEF, COPD, IDDM, CKD st2, OA, gout, hx alcohol abuse.    Examination-Activity Limitations Lift;Locomotion Level;Squat;Stairs;Stand;Transfers    Examination-Participation Restrictions Community Activity;Yard Work    Merchant navy officer Evolving/Moderate complexity    Rehab Potential Good    PT Frequency 2x / week    PT Duration 12 weeks    PT Treatment/Interventions ADLs/Self Care Home Management;DME Instruction;Gait training;Stair training;Functional mobility  training;Therapeutic activities;Therapeutic exercise;Balance training;Neuromuscular re-education;Patient/family education;Prosthetic Training;Vestibular    PT Next Visit Plan set new STGs, review prosthetic care,  prosthetic gait with RW including ramp & curb    Consulted and Agree with Plan of Care Patient;Family member/caregiver    Family Member Consulted wife, Taequan Stockhausen           Patient will benefit from skilled therapeutic intervention in order to improve the following deficits and impairments:  Abnormal gait,Cardiopulmonary status limiting activity,Decreased activity tolerance,Decreased balance,Decreased endurance,Decreased knowledge of use of DME,Decreased mobility,Decreased strength,Dizziness,Increased edema,Impaired flexibility,Postural dysfunction,Prosthetic Dependency,Obesity  Visit Diagnosis: Other abnormalities of gait and mobility  Muscle weakness (generalized)  Abnormal posture  Unsteadiness on feet     Problem List Patient Active Problem List   Diagnosis Date Noted  . Urinary incontinence 08/10/2020  . Viral upper respiratory illness 07/13/2020  . Blurred vision, bilateral 07/13/2020  . Hyperglycemia 05/12/2020  . Increased urinary frequency 05/09/2020  . Dehiscence of amputation stump ([place])   . Coronary artery disease 01/18/2020  . AKI (acute kidney injury) ([place]) 01/17/2020  . Nocturnal leg cramps 10/28/2019  . Hyperlipidemia 05/26/2019  . Edema leg 12/29/2018  . STEMI involving left circumflex coronary artery ([place]) 07/12/2018  . Status post transmetatarsal amputation of foot, right ([place]) 07/08/2018  . Limited mobility 05/12/2018  . Gingival foreign body 07/22/2017  . Anxiety state 06/02/2017  . Leg swelling 01/29/2017  . Idiopathic chronic venous hypertension of both lower extremities with inflammation 09/24/2016  . Testicular mass 04/18/2016  . Subacute osteomyelitis, right ankle and foot ([place]) 01/29/2016  . Diabetic polyneuropathy associated with  type 2 diabetes mellitus ([place])   . Type 2 diabetes mellitus with neurologic complication, with long-term current use of insulin ([place]) 12/08/2015  . Pulmonary nodule 02/01/2015  . Depression 12/09/2013  . Warts, genital 08/20/2013  . Diabetic retinopathy ([place]) 01/28/2013  . Sleep apnea 09/06/2010  . 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 Essential hypertension 11/09/2008  . COPD, mild ([place]) 10/06/2006  . SICKLE-CELL TRAIT 04/26/2006  . ERECTILE DYSFUNCTION 04/26/2006  . Tobacco abuse 04/26/2006  . PAD (peripheral artery disease) ([place]) 04/26/2006  . ALLERGIC RHINITIS 04/26/2006    Jamey Reas, PT, DPT 09/25/2020, 5:42 PM  [facility] [address] [place], [place], [address] Phone: [phone]   Fax:  [phone]  Name: Adam Ray MRN: [id number] Date of Birth: 10/29/1955

## 2020-09-26 ENCOUNTER — Telehealth: Payer: Self-pay | Admitting: Family Medicine

## 2020-09-26 ENCOUNTER — Ambulatory Visit (INDEPENDENT_AMBULATORY_CARE_PROVIDER_SITE_OTHER): Payer: Medicare HMO | Admitting: Orthopedic Surgery

## 2020-09-26 DIAGNOSIS — Z89511 Acquired absence of right leg below knee: Secondary | ICD-10-CM | POA: Diagnosis not present

## 2020-09-26 DIAGNOSIS — I87323 Chronic venous hypertension (idiopathic) with inflammation of bilateral lower extremity: Secondary | ICD-10-CM

## 2020-09-26 NOTE — Telephone Encounter (Signed)
   Telephone encounter was:  Successful.  09/26/2020 Name: MUHANAD TOROSYAN MRN: [id number] DOB: 1955-07-14  Marcello Moores is a 65 y.o. year old male who is a primary care patient of [name] Delorse Limber, MD . The community resource team was consulted for assistance with Transportation Needs   Care guide performed the following interventions: Discussed resources to assist with transportation. Patient stated he has already made an appointment to see an eye specialist, but is still in need of transportation assistance. Referred patient to [facility] [phone]). Since the patient is a veteran he can give their office a call to see what transportation services they have available to assist him. Patient stated he will give their office a call today. No addtional needs at this time. .  Follow Up Plan:  No further follow up planned at this time. The patient has been provided with needed resources.  [facility], Care Management Phone: [phone] Email: [email or web address]

## 2020-09-27 ENCOUNTER — Encounter: Payer: No Typology Code available for payment source | Admitting: Physical Therapy

## 2020-09-28 ENCOUNTER — Encounter: Payer: Self-pay | Admitting: Orthopedic Surgery

## 2020-09-28 NOTE — Progress Notes (Signed)
Office Visit Note   Patient: Adam Ray           Date of Birth: 1955-08-09           MRN: [id number] Visit Date: 09/26/2020              Requested by: [name], [place],  [place] [address] PCP: [name], MD  Chief Complaint  Patient presents with  . Right Leg - Follow-up    Right BKA   . Left Leg - Follow-up      HPI: Patient is a 65 year old gentleman who presents for evaluation for both lower extremities he is status post a right below the knee amputation and treatment for left lower extremity venous ulceration.  He is currently wearing his prosthesis on the right and walking in today.  Patient also complains of onychomycotic nails on the left foot.  Assessment & Plan: Visit Diagnoses:  1. Acquired absence of right leg below knee ([place])   2. Idiopathic chronic venous hypertension of both lower extremities with inflammation     Plan: Nails were trimmed x5 on the left patient will continue with compression stockings on the left he has subsided into the socket on the right and he is given a prescription for biotech for a new socket evaluation.  Follow-Up Instructions: Return in about 3 months (around 12/27/2020).   Ortho Exam  Patient is alert, oriented, no adenopathy, well-dressed, normal affect, normal respiratory effort. Examination the left foot he has no plantar ulcers he does have thickened discolored onychomycotic nails x5 the nails were trimmed x5 without complications.  He does have venous stasis insufficiency on the left side but no ulcers.  Semination the right leg he has subsided into the socket.  Patient will need socket modification or a new socket prescription was provided for biotech.  Imaging: No results found. No images are attached to the encounter.  Labs: Lab Results  Component Value Date   HGBA1C 11.3 (A) 08/10/2020   HGBA1C 11.5 (A) 05/08/2020   HGBA1C 8.7 (H) 01/19/2020   ESRSEDRATE 54 (H)  01/01/2020   ESRSEDRATE 8 01/29/2016   ESRSEDRATE 9 04/11/2012   CRP 2.6 (H) 01/29/2016   CRP 0.5 02/28/2014   REPTSTATUS 01/18/2020 FINAL 01/17/2020   REPTSTATUS 01/22/2020 FINAL 01/17/2020   REPTSTATUS 01/22/2020 FINAL 01/17/2020   GRAMSTAIN  04/11/2012    NO WBC SEEN RARE SQUAMOUS EPITHELIAL CELLS PRESENT MODERATE GRAM POSITIVE COCCI IN PAIRS RARE GRAM NEGATIVE RODS   GRAMSTAIN  04/11/2012    NO WBC SEEN RARE SQUAMOUS EPITHELIAL CELLS PRESENT MODERATE GRAM POSITIVE COCCI IN PAIRS RARE GRAM NEGATIVE RODS   CULT (A) 01/17/2020    <10,000 COLONIES/mL INSIGNIFICANT GROWTH Performed at [facility], [address], [place], [place] [address]    CULT  01/17/2020    NO GROWTH 5 DAYS Performed at [facility] [address] [place], [place] [address]    CULT  01/17/2020    NO GROWTH 5 DAYS Performed at [name] [address] [place], [place] [address]    LABORGA NO GROWTH 2 DAYS 07/19/2014     Lab Results  Component Value Date   ALBUMIN 3.7 02/23/2020   ALBUMIN 3.0 (L) 01/18/2020   ALBUMIN 3.4 (L) 01/17/2020    No results found for: MG No results found for: VD25OH  No results found for: PREALBUMIN CBC EXTENDED Latest Ref Rng & Units 02/23/2020 01/27/2020 01/19/2020  WBC 4.0 - 10.5 K/uL 3.9(L) 3.1(L) 3.2(L)  RBC 4.22 - 5.81 MIL/uL 3.97(L) 3.52(L) 3.42(L)  HGB 13.0 - 17.0 g/dL 11.6(L) 10.5(L) 9.9(L)  HCT 39.0 - 52.0 % 35.9(L) 31.2(L) 30.4(L)  PLT 150 - 400 K/uL 178 132(L) 167  NEUTROABS 1.7 - 7.7 K/uL - - -  LYMPHSABS 0.7 - 4.0 K/uL - - -     There is no height or weight on file to calculate BMI.  Orders:  No orders of the defined types were placed in this encounter.  No orders of the defined types were placed in this encounter.    Procedures: No procedures performed  Clinical Data: No additional findings.  ROS:  All other systems negative, except as noted in the HPI. Review of Systems  Objective: Vital Signs: There were no  vitals taken for this visit.  Specialty Comments:  No specialty comments available.  PMFS History: Patient Active Problem List   Diagnosis Date Noted  . Urinary incontinence 08/10/2020  . Viral upper respiratory illness 07/13/2020  . Blurred vision, bilateral 07/13/2020  . Hyperglycemia 05/12/2020  . Increased urinary frequency 05/09/2020  . Dehiscence of amputation stump ([place])   . Coronary artery disease 01/18/2020  . AKI (acute kidney injury) ([place]) 01/17/2020  . Nocturnal leg cramps 10/28/2019  . Hyperlipidemia 05/26/2019  . Edema leg 12/29/2018  . STEMI involving left circumflex coronary artery ([place]) 07/12/2018  . Status post transmetatarsal amputation of foot, right ([place]) 07/08/2018  . Limited mobility 05/12/2018  . Gingival foreign body 07/22/2017  . Anxiety state 06/02/2017  . Leg swelling 01/29/2017  . Idiopathic chronic venous hypertension of both lower extremities with inflammation 09/24/2016  . Testicular mass 04/18/2016  . Subacute osteomyelitis, right ankle and foot ([place]) 01/29/2016  . Diabetic polyneuropathy associated with type 2 diabetes mellitus ([place])   . Type 2 diabetes mellitus with neurologic complication, with long-term current use of insulin ([place]) 12/08/2015  . Pulmonary nodule 02/01/2015  . Depression 12/09/2013  . Warts, genital 08/20/2013  . Diabetic retinopathy ([place]) 01/28/2013  . Sleep apnea 09/06/2010  . 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 Essential hypertension 11/09/2008  . COPD, mild ([place]) 10/06/2006  . SICKLE-CELL TRAIT 04/26/2006  . ERECTILE DYSFUNCTION 04/26/2006  . Tobacco abuse 04/26/2006  . PAD (peripheral artery disease) ([place]) 04/26/2006  . ALLERGIC RHINITIS 04/26/2006   Past Medical History:  Diagnosis Date  . Allergy   . Arthritis   . Bronchitis   . Cataract   . CHF (congestive heart failure) ([employer])   . Chronic kidney disease   . Claudication [facility])    right foot ray resection  . Colon polyps    hyperplastic  . COPD (chronic  obstructive pulmonary disease) ([place])   . Coronary artery disease   . Diabetes mellitus    type II  . Genital warts   . Gout   . Hyperlipidemia   . Hypertension   . Myocardial infarction ([place])   . Osteomyelitis of third toe of right foot ([place])   . Pneumonia   . Status post amputation of toe of right foot ([place]) 09/24/2016    Family History  Problem Relation Age of Onset  . Diabetes Mother   . Stroke Mother   . Heart failure Father   . Colon cancer Neg Hx   . Esophageal cancer Neg Hx   . Rectal cancer Neg Hx   . Stomach cancer Neg Hx     Past Surgical History:  Procedure Laterality Date  . AMPUTATION Right  01/31/2016   Procedure: Right 2nd Toe Amputation;  Surgeon: Newt Minion, MD;  Location: [facility] [place];  Service: Orthopedics;  Laterality: Right;  . AMPUTATION Right 07/08/2018   Procedure: RIGHT TRANSMETATARSAL AMPUTATION;  Surgeon: Newt Minion, MD;  Location: [place];  Service: Orthopedics;  Laterality: Right;  . AMPUTATION Right 01/05/2020   Procedure: RIGHT BELOW KNEE AMPUTATION;  Surgeon: Newt Minion, MD;  Location: [place];  Service: Orthopedics;  Laterality: Right;  . AMPUTATION Right 02/23/2020   Procedure: RIGHT BELOW KNEE AMPUTATION REVISION;  Surgeon: Newt Minion, MD;  Location: [place];  Service: Orthopedics;  Laterality: Right;  . CATARACT EXTRACTION     right eye  . COLONOSCOPY    . CORONARY STENT INTERVENTION N/A 07/12/2018   Procedure: CORONARY STENT INTERVENTION;  Surgeon: Troy Sine, MD;  Location: [place] CV LAB;  Service: Cardiovascular;  Laterality: N/A;  . CORONARY/GRAFT ACUTE MI REVASCULARIZATION N/A 07/12/2018   Procedure: Coronary/Graft Acute MI Revascularization;  Surgeon: Troy Sine, MD;  Location: [place] CV LAB;  Service: Cardiovascular;  Laterality: N/A;  . I & D EXTREMITY  04/11/2012   Procedure: IRRIGATION AND DEBRIDEMENT EXTREMITY;  Surgeon: Wylene Simmer, MD;  Location: [facility];  Service: Orthopedics;  Laterality: Right;  . LEFT HEART CATH  AND CORONARY ANGIOGRAPHY N/A 07/12/2018   Procedure: LEFT HEART CATH AND CORONARY ANGIOGRAPHY;  Surgeon: Troy Sine, MD;  Location: [place] CV LAB;  Service: Cardiovascular;  Laterality: N/A;  . Surgery left great toe    . Tear ducts bilateral eyes    . TRANSMETATARSAL AMPUTATION Right 07/08/2018   Social History   Occupational History  . Occupation: disabled  Tobacco Use  . Smoking status: Former Smoker    Packs/day: 0.30    Years: 48.00    Pack years: 14.40    Types: Cigars, Cigarettes    Start date: 07/02/1963    Quit date: 06/01/2019    Years since quitting: 1.3  . Smokeless tobacco: Former Network engineer  . Vaping Use: Former  Substance and Sexual Activity  . Alcohol use: Not Currently    Alcohol/week: 0.0 standard drinks  . Drug use: No  . Sexual activity: Not on file

## 2020-10-02 ENCOUNTER — Encounter: Payer: No Typology Code available for payment source | Admitting: Physical Therapy

## 2020-10-04 ENCOUNTER — Encounter: Payer: No Typology Code available for payment source | Admitting: Physical Therapy

## 2020-10-09 ENCOUNTER — Encounter: Payer: No Typology Code available for payment source | Admitting: Physical Therapy

## 2020-10-11 ENCOUNTER — Encounter: Payer: No Typology Code available for payment source | Admitting: Physical Therapy

## 2020-10-16 ENCOUNTER — Encounter: Payer: Medicare HMO | Admitting: Physical Therapy

## 2020-10-16 ENCOUNTER — Encounter: Payer: No Typology Code available for payment source | Admitting: Physical Therapy

## 2020-10-18 ENCOUNTER — Encounter: Payer: Self-pay | Admitting: Physical Therapy

## 2020-10-18 ENCOUNTER — Encounter: Payer: No Typology Code available for payment source | Admitting: Physical Therapy

## 2020-10-18 ENCOUNTER — Ambulatory Visit (INDEPENDENT_AMBULATORY_CARE_PROVIDER_SITE_OTHER): Payer: Medicare HMO | Admitting: Physical Therapy

## 2020-10-18 ENCOUNTER — Other Ambulatory Visit: Payer: Self-pay

## 2020-10-18 DIAGNOSIS — R2681 Unsteadiness on feet: Secondary | ICD-10-CM | POA: Diagnosis not present

## 2020-10-18 DIAGNOSIS — R2689 Other abnormalities of gait and mobility: Secondary | ICD-10-CM | POA: Diagnosis not present

## 2020-10-18 DIAGNOSIS — R293 Abnormal posture: Secondary | ICD-10-CM | POA: Diagnosis not present

## 2020-10-18 DIAGNOSIS — M6281 Muscle weakness (generalized): Secondary | ICD-10-CM

## 2020-10-18 NOTE — Therapy (Signed)
[facility] Physical Therapy [address] [place], [place], [address] Phone: [phone]   Fax:  [phone]  Physical Therapy Treatment  Patient Details  Name: Adam Ray MRN: [id number] Date of Birth: 19-Nov-1955 Referring Provider (PT): Bevely Palmer Persons, [place]   Encounter Date: 10/18/2020   PT End of Session - 10/18/20 1511    Visit Number 6    Number of Visits 26    Date for PT Re-Evaluation 11/20/20    Authorization Type [employer] Medicare    Progress Note Due on Visit 10    PT Start Time 1515    PT Stop Time 1600    PT Time Calculation (min) 45 min    Equipment Utilized During Treatment Gait belt    Activity Tolerance Patient tolerated treatment well    Behavior During Therapy [facility] for tasks assessed/performed           Past Medical History:  Diagnosis Date  . Allergy   . Arthritis   . Bronchitis   . Cataract   . CHF (congestive heart failure) ([place])   . Chronic kidney disease   . Claudication [facility])    right foot ray resection  . Colon polyps    hyperplastic  . COPD (chronic obstructive pulmonary disease) ([place])   . Coronary artery disease   . Diabetes mellitus    type II  . Genital warts   . Gout   . Hyperlipidemia   . Hypertension   . Myocardial infarction ([place])   . Osteomyelitis of third toe of right foot ([place])   . Pneumonia   . Status post amputation of toe of right foot ([place]) 09/24/2016    Past Surgical History:  Procedure Laterality Date  . AMPUTATION Right 01/31/2016   Procedure: Right 2nd Toe Amputation;  Surgeon: Newt Minion, MD;  Location: [place];  Service: Orthopedics;  Laterality: Right;  . AMPUTATION Right 07/08/2018   Procedure: RIGHT TRANSMETATARSAL AMPUTATION;  Surgeon: Newt Minion, MD;  Location: [place];  Service: Orthopedics;  Laterality: Right;  . AMPUTATION Right 01/05/2020   Procedure: RIGHT BELOW KNEE AMPUTATION;  Surgeon: Newt Minion, MD;  Location: [place];  Service: Orthopedics;  Laterality: Right;  . AMPUTATION  Right 02/23/2020   Procedure: RIGHT BELOW KNEE AMPUTATION REVISION;  Surgeon: Newt Minion, MD;  Location: [place];  Service: Orthopedics;  Laterality: Right;  . CATARACT EXTRACTION     right eye  . COLONOSCOPY    . CORONARY STENT INTERVENTION N/A 07/12/2018   Procedure: CORONARY STENT INTERVENTION;  Surgeon: Troy Sine, MD;  Location: [place] [facility];  Service: Cardiovascular;  Laterality: N/A;  . CORONARY/GRAFT ACUTE MI REVASCULARIZATION N/A 07/12/2018   Procedure: Coronary/Graft Acute MI Revascularization;  Surgeon: Troy Sine, MD;  Location: [place] [facility];  Service: Cardiovascular;  Laterality: N/A;  . I & D EXTREMITY  04/11/2012   Procedure: IRRIGATION AND DEBRIDEMENT EXTREMITY;  Surgeon: Wylene Simmer, MD;  Location: [place];  Service: Orthopedics;  Laterality: Right;  . LEFT HEART CATH AND CORONARY ANGIOGRAPHY N/A 07/12/2018   Procedure: LEFT HEART CATH AND CORONARY ANGIOGRAPHY;  Surgeon: Troy Sine, MD;  Location: [place] [facility];  Service: Cardiovascular;  Laterality: N/A;  . Surgery left great toe    . Tear ducts bilateral eyes    . TRANSMETATARSAL AMPUTATION Right 07/08/2018    There were no vitals filed for this visit.   Subjective Assessment - 10/18/20 1515    Subjective He has missed  PT due to transportation issues.  He is wearing prosthesis daily most of awake hours without issues. He fell walking down hill in his backyard. He denies injuries.    Patient is accompained by: Family member   wife, Rashawd Laskaris   Pertinent History HTN, CAD, MI 1/20, PAD, CHF, HFpEF, COPD, IDDM, CKD st2, OA, gout, hx alcohol abuse.    Patient Stated Goals To use prosthesis to walk in community & drive    Currently in Pain? No/denies    Pain Onset 1 to 4 weeks ago              [facility] PT Assessment - 10/18/20 1512      Assessment   Medical Diagnosis Right Transtibial Amputation    Referring Provider (PT) Bevely Palmer Persons, PA      Berg Balance Test   Sit to Stand Able  to stand  independently using hands    Standing Unsupported Able to stand safely 2 minutes    Sitting with Back Unsupported but Feet Supported on Floor or Stool Able to sit safely and securely 2 minutes    Stand to Sit Controls descent by using hands    Transfers Able to transfer safely, definite need of hands    Standing Unsupported with Eyes Closed Able to stand 10 seconds with supervision    Standing Unsupported with Feet Together Able to place feet together independently and stand for 1 minute with supervision    From Standing, Reach Forward with Outstretched Arm Can reach forward >12 cm safely (5")    From Standing Position, Pick up Object from Floor Able to pick up shoe, needs supervision    From Standing Position, Turn to Look Behind Over each Shoulder Looks behind one side only/other side shows less weight shift    Turn 360 Degrees Able to turn 360 degrees safely but slowly    Standing Unsupported, Alternately Place Feet on Step/Stool Needs assistance to keep from falling or unable to try    Standing Unsupported, One Foot in Front Able to take small step independently and hold 30 seconds    Standing on One Leg Tries to lift leg/unable to hold 3 seconds but remains standing independently    Total Score 37                         [facility] Adult PT Treatment/Exercise - 10/18/20 1512      Transfers   Transfers Sit to Stand;Stand to [employer] Transfers    Sit to Stand 5: Supervision;With upper extremity assist;With armrests;From chair/3-in-1;Other (comment)   to rollator walker   Sit to Stand Details Visual cues for safe use of DME/AE;Verbal cues for safe use of DME/AE;Verbal cues for technique    Stand to Sit 5: Supervision;With upper extremity assist;With armrests;To chair/3-in-1;Other (comment)   from locked rollator walker   Stand to Sit Details (indicate cue type and reason) Visual cues for safe use of DME/AE;Verbal cues for safe use of DME/AE;Verbal cues for  technique    Stand Pivot Transfers 5: Supervision;With armrests   turning 180* sit / stand from rollator walker seat     Ambulation/Gait   Ambulation/Gait Yes    Ambulation/Gait Assistance 5: Supervision    Ambulation/Gait Assistance Details PT verbal cues on ambulating with step through pattern, upright posture.  worked on scanning & maintaining path.    Ambulation Distance (Feet) 150 Feet   150' X 2   Assistive device Prosthesis;None  Ambulation Surface Level;Indoor    Ramp --    Curb --      Neuro Re-ed    Neuro Re-ed Details  PT demo, verbal & tactile cues on standing with pelvic weight shift to midline for ADLs. Pt verbalized & demo return demo understanding.      Exercises   Exercises Knee/Hip      Knee/Hip Exercises: Aerobic   Nustep seat 13 with BLEs & BUEs level 7 for 8 min      Knee/Hip Exercises: Standing   Forward Step Up Right;Left;1 set;10 reps;Hand Hold: 2;Step Height: 6"    Forward Step Up Limitations PT demo & verbal cues on technique    Step Down Right;Left;1 set;10 reps;Hand Hold: 2;Step Height: 6"    Step Down Limitations PT demo & verbal cues on technique    Rocker Board 1 minute   ant/level/post & right/level/left   Rocker Board Limitations PT demo & verbal cues on technique      Knee/Hip Exercises: Seated   Sit to Sand 1 set;10 reps;without UE support   from 24" bar stool     Prosthetics   Prosthetic Care Comments  PT instructed pt & wife in signs of sweating & need to dry limb/liner. Not drying increases risk of skin issues. PT recommended using antiperspirant on residual limb after showering daily.    Current prosthetic wear tolerance (days/week)  daily    Current prosthetic wear tolerance (#hours/day)  most of awake hours except 1-2 hrs in morning & end of day.    Current prosthetic weight-bearing tolerance (hours/day)  Patient tolerated standing for 8 minutes with partial weight on prosthesis without c/o pain or discomfort    Edema pitting     Residual limb condition  limb was sweaty when prosthesis doffed with distal limb milky gray color, no open areas, dry skin,  no hair growth, normal termperature, shape cylinderical    Education Provided Skin check;Residual limb care;Prosthetic cleaning;Other (comment)   see prosthetic care comments   Person(s) Educated Patient;Spouse    Education Method Explanation;Demonstration;Verbal cues    Education Method Verbalized understanding;Verbal cues required;Needs further instruction                    PT Short Term Goals - 10/18/20 1621      PT SHORT TERM GOAL #1   Title Patient demonstrates proper donning & verbalizes proper cleaning of prosthesis    Baseline Partially MET 09/25/2020  He is able to properly donne prosthesis but is inappropriately padding socket.    Time 1    Period Months    Status Partially Met    Target Date 09/20/20      PT SHORT TERM GOAL #2   Title Pateint tolerates prosthesis wear >/= 12 houes without skin issues.    Baseline MET 09/25/2020    Time 1    Period Months    Status Achieved    Target Date 09/20/20      PT SHORT TERM GOAL #3   Title Berg Balance >= 25 / 56    Time 1    Period Months    Status Achieved    Target Date 09/20/20      PT SHORT TERM GOAL #4   Title Patient ambulates 300' with RW & prosthesis with verbal cues for deviations no safety issues with supervision.    Baseline MET 09/25/2020    Time 1    Period Months    Status Achieved  Target Date 09/20/20      PT SHORT TERM GOAL #5   Title Patient negotiates ramps & curbs with RW & prosthesis with minA    Baseline MET 09/25/2020    Time 1    Period Months    Status Achieved    Target Date 09/20/20             PT Long Term Goals - 09/25/20 1740      PT LONG TERM GOAL #1   Title Pateint demonstrates & verbalizes proper prosthetic care to enable safe utilization of prosthesis.    Time 3    Period Months    Status On-going    Target Date 11/20/20      PT LONG  TERM GOAL #2   Title Patient tolerates wear of prosthesis >90% of awake hours without skin or limb pain issues to enable function during his day.    Time 3    Period Months    Status On-going    Target Date 11/20/20      PT LONG TERM GOAL #3   Title Berg Balance >/= 45/56    Time 3    Period Months    Status On-going    Target Date 11/20/20      PT LONG TERM GOAL #4   Title Pateint ambulates with prosthesis with cane or less 500' modified independent.    Time 3    Period Months    Status On-going    Target Date 11/20/20      PT LONG TERM GOAL #5   Title Patient negotiates ramps, curbs and stairs with single rail with cane or less and prosthesis modified independent.    Time 3    Period Months    Status On-going    Target Date 11/20/20                 Plan - 10/18/20 1511    Clinical Impression Statement Patient improved balance with Berg score of 37/56 however <45/56 still indicates high fall risk.  PT worked on standing activities to facilitate balance & functional strength.    Personal Factors and Comorbidities Comorbidity 3+;Fitness;Time since onset of injury/illness/exacerbation    Comorbidities HTN, CAD, MI 1/20, PAD, CHF, HFpEF, COPD, IDDM, CKD st2, OA, gout, hx alcohol abuse.    Examination-Activity Limitations Lift;Locomotion Level;Squat;Stairs;Stand;Transfers    Examination-Participation Restrictions Community Activity;Yard Work    [employer] Evolving/Moderate complexity    Rehab Potential Good    PT Frequency 2x / week    PT Duration 12 weeks    PT Treatment/Interventions ADLs/Self Care Home Management;DME Instruction;Gait training;Stair training;Functional mobility training;Therapeutic activities;Therapeutic exercise;Balance training;Neuromuscular re-education;Patient/family education;Prosthetic Training;Vestibular    PT Next Visit Plan work towards LTGs, review prosthetic care,  prosthetic gait  including ramp & curb    Consulted  and Agree with Plan of Care Patient;Family member/caregiver    Family Member Consulted wife, Raiden Haydu           Patient will benefit from skilled therapeutic intervention in order to improve the following deficits and impairments:  Abnormal gait,Cardiopulmonary status limiting activity,Decreased activity tolerance,Decreased balance,Decreased endurance,Decreased knowledge of use of DME,Decreased mobility,Decreased strength,Dizziness,Increased edema,Impaired flexibility,Postural dysfunction,Prosthetic Dependency,Obesity  Visit Diagnosis: Other abnormalities of gait and mobility  Muscle weakness (generalized)  Abnormal posture  Unsteadiness on feet     Problem List Patient Active Problem List   Diagnosis Date Noted  . Urinary incontinence 08/10/2020  . Viral upper respiratory illness 07/13/2020  . Blurred  vision, bilateral 07/13/2020  . Hyperglycemia 05/12/2020  . Increased urinary frequency 05/09/2020  . Dehiscence of amputation stump ([place])   . Coronary artery disease 01/18/2020  . AKI (acute kidney injury) ([place]) 01/17/2020  . Nocturnal leg cramps 10/28/2019  . Hyperlipidemia 05/26/2019  . Edema leg 12/29/2018  . STEMI involving left circumflex coronary artery ([place]) 07/12/2018  . Status post transmetatarsal amputation of foot, right ([place]) 07/08/2018  . Limited mobility 05/12/2018  . Gingival foreign body 07/22/2017  . Anxiety state 06/02/2017  . Leg swelling 01/29/2017  . Idiopathic chronic venous hypertension of both lower extremities with inflammation 09/24/2016  . Testicular mass 04/18/2016  . Subacute osteomyelitis, right ankle and foot ([place]) 01/29/2016  . Diabetic polyneuropathy associated with type 2 diabetes mellitus ([place])   . Type 2 diabetes mellitus with neurologic complication, with long-term current use of insulin ([place]) 12/08/2015  . Pulmonary nodule 02/01/2015  . Depression 12/09/2013  . Warts, genital 08/20/2013  . Diabetic retinopathy ([place]) 01/28/2013   . Sleep apnea 09/06/2010  . 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 Essential hypertension 11/09/2008  . COPD, mild ([place]) 10/06/2006  . SICKLE-CELL TRAIT 04/26/2006  . ERECTILE DYSFUNCTION 04/26/2006  . Tobacco abuse 04/26/2006  . PAD (peripheral artery disease) ([place]) 04/26/2006  . ALLERGIC RHINITIS 04/26/2006    Jamey Reas, PT, DPT 10/18/2020, 4:24 PM  [facility] Physical Therapy [address] [place], [place], [address] Phone: [phone]   Fax:  [phone]  Name: Adam Ray MRN: [id number] Date of Birth: [birth year]

## 2020-10-23 ENCOUNTER — Encounter: Payer: No Typology Code available for payment source | Admitting: Physical Therapy

## 2020-10-25 ENCOUNTER — Encounter (HOSPITAL_COMMUNITY): Payer: Self-pay | Admitting: Emergency Medicine

## 2020-10-25 ENCOUNTER — Encounter: Payer: No Typology Code available for payment source | Admitting: Physical Therapy

## 2020-10-25 ENCOUNTER — Encounter: Payer: Self-pay | Admitting: Physical Therapy

## 2020-10-25 ENCOUNTER — Ambulatory Visit (INDEPENDENT_AMBULATORY_CARE_PROVIDER_SITE_OTHER): Payer: Medicare HMO | Admitting: Physical Therapy

## 2020-10-25 ENCOUNTER — Ambulatory Visit (INDEPENDENT_AMBULATORY_CARE_PROVIDER_SITE_OTHER): Payer: No Typology Code available for payment source

## 2020-10-25 ENCOUNTER — Ambulatory Visit (HOSPITAL_COMMUNITY)
Admission: EM | Admit: 2020-10-25 | Discharge: 2020-10-25 | Disposition: A | Discharge status: Home / Self Care | Payer: No Typology Code available for payment source | Attending: Emergency Medicine | Admitting: Emergency Medicine

## 2020-10-25 ENCOUNTER — Other Ambulatory Visit: Payer: Self-pay

## 2020-10-25 DIAGNOSIS — R2681 Unsteadiness on feet: Secondary | ICD-10-CM | POA: Diagnosis not present

## 2020-10-25 DIAGNOSIS — M06222 Rheumatoid bursitis, left elbow: Secondary | ICD-10-CM

## 2020-10-25 DIAGNOSIS — M6281 Muscle weakness (generalized): Secondary | ICD-10-CM

## 2020-10-25 DIAGNOSIS — M79602 Pain in left arm: Secondary | ICD-10-CM

## 2020-10-25 DIAGNOSIS — M25522 Pain in left elbow: Secondary | ICD-10-CM | POA: Diagnosis not present

## 2020-10-25 DIAGNOSIS — R2689 Other abnormalities of gait and mobility: Secondary | ICD-10-CM

## 2020-10-25 DIAGNOSIS — M7989 Other specified soft tissue disorders: Secondary | ICD-10-CM | POA: Diagnosis not present

## 2020-10-25 DIAGNOSIS — M778 Other enthesopathies, not elsewhere classified: Secondary | ICD-10-CM | POA: Diagnosis not present

## 2020-10-25 DIAGNOSIS — M7032 Other bursitis of elbow, left elbow: Secondary | ICD-10-CM | POA: Diagnosis not present

## 2020-10-25 DIAGNOSIS — R293 Abnormal posture: Secondary | ICD-10-CM | POA: Diagnosis not present

## 2020-10-25 MED ORDER — NAPROXEN 500 MG PO TABS: 500.0000 mg | ORAL_TABLET | Freq: Two times a day (BID) | ORAL | 0 refills | Status: AC

## 2020-10-25 NOTE — Therapy (Signed)
[facility] Physical Therapy [address] [place], [place], [address] Phone: [phone]   Fax:  [phone]  Physical Therapy Treatment & Discharge Summary  Patient Details  Name: Adam Ray MRN: [id number] Date of Birth: December 23, 1955 Referring Provider (PT): Bevely Palmer Persons, [place]   Encounter Date: 10/25/2020   PHYSICAL THERAPY DISCHARGE SUMMARY  Visits from Start of Care: 7  Current functional level related to goals / functional outcomes: See below   Remaining deficits: See below   Education / Equipment: Prosthetic care & HEP  Plan: Patient agrees to discharge.  Patient goals were met. Patient is being discharged due to meeting the stated rehab goals.  ?????          PT End of Session - 10/25/20 1524    Visit Number 7    Number of Visits 26    Date for PT Re-Evaluation 11/20/20    Authorization Type [employer] Medicare    Progress Note Due on Visit 10    PT Start Time 1516    PT Stop Time 1557    PT Time Calculation (min) 41 min    Equipment Utilized During Treatment Gait belt    Activity Tolerance Patient tolerated treatment well    Behavior During Therapy WFL for tasks assessed/performed           Past Medical History:  Diagnosis Date  . Allergy   . Arthritis   . Bronchitis   . Cataract   . CHF (congestive heart failure) ([place])   . Chronic kidney disease   . Claudication [facility])    right foot ray resection  . Colon polyps    hyperplastic  . COPD (chronic obstructive pulmonary disease) ([place])   . Coronary artery disease   . Diabetes mellitus    type II  . Genital warts   . Gout   . Hyperlipidemia   . Hypertension   . Myocardial infarction ([place])   . Osteomyelitis of third toe of right foot ([place])   . Pneumonia   . Status post amputation of toe of right foot ([place]) 09/24/2016    Past Surgical History:  Procedure Laterality Date  . AMPUTATION Right 01/31/2016   Procedure: Right 2nd Toe Amputation;  Surgeon: Newt Minion, MD;   Location: [place];  Service: Orthopedics;  Laterality: Right;  . AMPUTATION Right 07/08/2018   Procedure: RIGHT TRANSMETATARSAL AMPUTATION;  Surgeon: Newt Minion, MD;  Location: [place];  Service: Orthopedics;  Laterality: Right;  . AMPUTATION Right 01/05/2020   Procedure: RIGHT BELOW KNEE AMPUTATION;  Surgeon: Newt Minion, MD;  Location: [place];  Service: Orthopedics;  Laterality: Right;  . AMPUTATION Right 02/23/2020   Procedure: RIGHT BELOW KNEE AMPUTATION REVISION;  Surgeon: Newt Minion, MD;  Location: [place];  Service: Orthopedics;  Laterality: Right;  . CATARACT EXTRACTION     right eye  . COLONOSCOPY    . CORONARY STENT INTERVENTION N/A 07/12/2018   Procedure: CORONARY STENT INTERVENTION;  Surgeon: Troy Sine, MD;  Location: [place] [facility];  Service: Cardiovascular;  Laterality: N/A;  . CORONARY/GRAFT ACUTE MI REVASCULARIZATION N/A 07/12/2018   Procedure: Coronary/Graft Acute MI Revascularization;  Surgeon: Troy Sine, MD;  Location: [place] [facility];  Service: Cardiovascular;  Laterality: N/A;  . I & D EXTREMITY  04/11/2012   Procedure: IRRIGATION AND DEBRIDEMENT EXTREMITY;  Surgeon: Wylene Simmer, MD;  Location: [place];  Service: Orthopedics;  Laterality: Right;  . LEFT HEART CATH AND CORONARY ANGIOGRAPHY N/A 07/12/2018  Procedure: LEFT HEART CATH AND CORONARY ANGIOGRAPHY;  Surgeon: Troy Sine, MD;  Location: [place] [facility];  Service: Cardiovascular;  Laterality: N/A;  . Surgery left great toe    . Tear ducts bilateral eyes    . TRANSMETATARSAL AMPUTATION Right 07/08/2018    There were no vitals filed for this visit.   Subjective Assessment - 10/25/20 1520    Subjective He is wearing prosthesis all awake hours without issues.    Patient is accompained by: Family member   wife, Antoin Dargis   Pertinent History HTN, CAD, MI 1/20, PAD, CHF, HFpEF, COPD, IDDM, CKD st2, OA, gout, hx alcohol abuse.    Patient Stated Goals To use prosthesis to walk in community &  drive    Currently in Pain? No/denies    Pain Onset 1 to 4 weeks ago              [facility] PT Assessment - 10/25/20 1516      Assessment   Medical Diagnosis Right Transtibial Amputation    Referring Provider (PT) Bevely Palmer Persons, PA      Ambulation/Gait   Gait velocity 2.40 ft/sec   initially 1.21 ft/sec     Berg Balance Test   Sit to Stand Able to stand  independently using hands    Standing Unsupported Able to stand safely 2 minutes    Sitting with Back Unsupported but Feet Supported on Floor or Stool Able to sit safely and securely 2 minutes    Stand to Sit Sits safely with minimal use of hands    Transfers Able to transfer safely, minor use of hands    Standing Unsupported with Eyes Closed Able to stand 10 seconds safely    Standing Unsupported with Feet Together Able to place feet together independently and stand 1 minute safely    From Standing, Reach Forward with Outstretched Arm Can reach confidently >25 cm (10")    From Standing Position, Pick up Object from Floor Able to pick up shoe safely and easily    From Standing Position, Turn to Look Behind Over each Shoulder Looks behind from both sides and weight shifts well    Turn 360 Degrees Able to turn 360 degrees safely but slowly    Standing Unsupported, Alternately Place Feet on Step/Stool Able to complete >2 steps/needs minimal assist    Standing Unsupported, One Foot in Front Able to plae foot ahead of the other independently and hold 30 seconds    Standing on One Leg Tries to lift leg/unable to hold 3 seconds but remains standing independently    Total Score 46           Prosthetics Assessment - 10/25/20 1516      Prosthetics   Prosthetic Care Independent with Skin check;Residual limb care;Care of non-amputated limb;Prosthetic cleaning;Ply sock cleaning;Correct ply sock adjustment;Proper wear schedule/adjustment;Proper weight-bearing schedule/adjustment    Prosthesis Description silicon liner with shuttle pin  lock, total contact socket, pt has placed foam pad in socket.  PT recommended seeing prosthetist for adjustments & pads.                        [place] Adult PT Treatment/Exercise - 10/25/20 1516      Transfers   Transfers Sit to Stand;Stand to Sit;Stand Pivot Transfers    Sit to Stand 6: Modified independent (Device/Increase time);With upper extremity assist;From chair/3-in-1    Sit to Stand Details --    Stand to Sit 6:  Modified independent (Device/Increase time);Without upper extremity assist;To chair/3-in-1    Stand to Sit Details (indicate cue type and reason) --    Stand Pivot Transfers --      Ambulation/Gait   Ambulation/Gait Yes    Ambulation/Gait Assistance 6: Modified independent (Device/Increase time)    Ambulation Distance (Feet) 250 Feet    Assistive device Prosthesis;None    Ambulation Surface Level;Indoor    Ramp 6: Modified independent (Device)   prosthesis only   Curb 6: Modified independent (Device/increase time)   prosthesis only     Neuro Re-ed    Neuro Re-ed Details  --      Exercises   Exercises Knee/Hip      Knee/Hip Exercises: Aerobic   Nustep --      Knee/Hip Exercises: Standing   Forward Step Up --    Forward Step Up Limitations --    Step Down --    Step Down Limitations --    Rocker Board --    Rocker Board Limitations --      Knee/Hip Exercises: Seated   Sit to Sand 1 set;10 reps;without UE support   from 24" bar stool     Prosthetics   Prosthetic Care Comments  --    Current prosthetic wear tolerance (days/week)  daily    Current prosthetic wear tolerance (#hours/day)  most of awake hours except 1-2 hrs in morning & end of day.    Current prosthetic weight-bearing tolerance (hours/day)  Patient tolerated standing for 8 minutes with partial weight on prosthesis without c/o pain or discomfort    Edema pitting    Residual limb condition  limb was sweaty when prosthesis doffed with distal limb milky gray color, no open areas, dry  skin,  no hair growth, normal termperature, shape cylinderical    Education Provided --                    PT Short Term Goals - 10/18/20 1621      PT SHORT TERM GOAL #1   Title Patient demonstrates proper donning & verbalizes proper cleaning of prosthesis    Baseline Partially MET 09/25/2020  He is able to properly donne prosthesis but is inappropriately padding socket.    Time 1    Period Months    Status Partially Met    Target Date 09/20/20      PT SHORT TERM GOAL #2   Title Pateint tolerates prosthesis wear >/= 12 houes without skin issues.    Baseline MET 09/25/2020    Time 1    Period Months    Status Achieved    Target Date 09/20/20      PT SHORT TERM GOAL #3   Title Berg Balance >= 25 / 56    Time 1    Period Months    Status Achieved    Target Date 09/20/20      PT SHORT TERM GOAL #4   Title Patient ambulates 300' with RW & prosthesis with verbal cues for deviations no safety issues with supervision.    Baseline MET 09/25/2020    Time 1    Period Months    Status Achieved    Target Date 09/20/20      PT SHORT TERM GOAL #5   Title Patient negotiates ramps & curbs with RW & prosthesis with minA    Baseline MET 09/25/2020    Time 1    Period Months    Status Achieved    Target  Date 09/20/20             PT Long Term Goals - 10/25/20 1557      PT LONG TERM GOAL #1   Title Pateint demonstrates & verbalizes proper prosthetic care to enable safe utilization of prosthesis.    Time 3    Period Months    Status Achieved      PT LONG TERM GOAL #2   Title Patient tolerates wear of prosthesis >90% of awake hours without skin or limb pain issues to enable function during his day.    Time 3    Period Months    Status Achieved      PT LONG TERM GOAL #3   Title Berg Balance >/= 45/56    Time 3    Period Months    Status Achieved      PT LONG TERM GOAL #4   Title Pateint ambulates with prosthesis with cane or less 500' modified independent.     Baseline He ambulates up to 250' with PT but reports greater distances outside of PT.    Time 3    Period Months    Status Partially Met      PT LONG TERM GOAL #5   Title Patient negotiates ramps, curbs and stairs with single rail with cane or less and prosthesis modified independent.    Time 3    Period Months    Status Achieved                 Plan - 10/25/20 1519    Clinical Impression Statement Patient met all LTGs. He appears to be safely functioning with prosthesis at community level.    Personal Factors and Comorbidities Comorbidity 3+;Fitness;Time since onset of injury/illness/exacerbation    Comorbidities HTN, CAD, MI 1/20, PAD, CHF, HFpEF, COPD, IDDM, CKD st2, OA, gout, hx alcohol abuse.    Examination-Activity Limitations Lift;Locomotion Level;Squat;Stairs;Stand;Transfers    Examination-Participation Restrictions Community Activity;Yard Work    [employer] Evolving/Moderate complexity    Rehab Potential Good    PT Frequency 2x / week    PT Duration 12 weeks    PT Treatment/Interventions ADLs/Self Care Home Management;DME Instruction;Gait training;Stair training;Functional mobility training;Therapeutic activities;Therapeutic exercise;Balance training;Neuromuscular re-education;Patient/family education;Prosthetic Training;Vestibular    PT Next Visit Plan discharge PT    Consulted and Agree with Plan of Care Patient;Family member/caregiver    Family Member Consulted wife, Refoel Palladino           Patient will benefit from skilled therapeutic intervention in order to improve the following deficits and impairments:  Abnormal gait,Cardiopulmonary status limiting activity,Decreased activity tolerance,Decreased balance,Decreased endurance,Decreased knowledge of use of DME,Decreased mobility,Decreased strength,Dizziness,Increased edema,Impaired flexibility,Postural dysfunction,Prosthetic Dependency,Obesity  Visit Diagnosis: Muscle weakness  (generalized)  Abnormal posture  Unsteadiness on feet  Other abnormalities of gait and mobility     Problem List Patient Active Problem List   Diagnosis Date Noted  . Urinary incontinence 08/10/2020  . Viral upper respiratory illness 07/13/2020  . Blurred vision, bilateral 07/13/2020  . Hyperglycemia 05/12/2020  . Increased urinary frequency 05/09/2020  . Dehiscence of amputation stump ([place])   . Coronary artery disease 01/18/2020  . AKI (acute kidney injury) ([place]) 01/17/2020  . Nocturnal leg cramps 10/28/2019  . Hyperlipidemia 05/26/2019  . Edema leg 12/29/2018  . STEMI involving left circumflex coronary artery ([place]) 07/12/2018  . Status post transmetatarsal amputation of foot, right ([place]) 07/08/2018  . Limited mobility 05/12/2018  . Gingival foreign body 07/22/2017  . Anxiety state 06/02/2017  .  Leg swelling 01/29/2017  . Idiopathic chronic venous hypertension of both lower extremities with inflammation 09/24/2016  . Testicular mass 04/18/2016  . Subacute osteomyelitis, right ankle and foot ([place]) 01/29/2016  . Diabetic polyneuropathy associated with type 2 diabetes mellitus ([place])   . Type 2 diabetes mellitus with neurologic complication, with long-term current use of insulin ([place]) 12/08/2015  . Pulmonary nodule 02/01/2015  . Depression 12/09/2013  . Warts, genital 08/20/2013  . Diabetic retinopathy ([place]) 01/28/2013  . Sleep apnea 09/06/2010  . 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 Essential hypertension 11/09/2008  . COPD, mild ([place]) 10/06/2006  . SICKLE-CELL TRAIT 04/26/2006  . ERECTILE DYSFUNCTION 04/26/2006  . Tobacco abuse 04/26/2006  . PAD (peripheral artery disease) ([place]) 04/26/2006  . ALLERGIC RHINITIS 04/26/2006    Jamey Reas, PT, DPT 10/25/2020, 4:00 PM  [facility] Physical Therapy [address] [place], [place], [address] Phone: [phone]   Fax:  [phone]  Name: WON KREUZER MRN: [id number] Date of Birth: 1955/11/15
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
## 2020-10-25 NOTE — Discharge Instructions (Signed)
Take the naproxen twice a day for the next 10-14 days.  After 14 days, you can take the naproxen as needed for pain and swelling.    Rest as much as possible Ice for 10-15 minutes every 4-6 hours as needed for pain and swelling Compression- use an ace bandage or splint for comfort Elevate above your hip when sitting and standing  Follow up with sports medicine or orthopedics if symptoms do not improve in the next few days.

## 2020-10-30 ENCOUNTER — Encounter: Payer: No Typology Code available for payment source | Admitting: Physical Therapy

## 2020-11-01 ENCOUNTER — Encounter: Payer: No Typology Code available for payment source | Admitting: Physical Therapy

## 2020-11-07 ENCOUNTER — Other Ambulatory Visit: Payer: Self-pay | Admitting: Family Medicine

## 2020-11-08 NOTE — Telephone Encounter (Signed)
Can you clarify with patient if he is actually taking this medication? He told me last time he was not. Also did he actually see urology?  Thanks [name], MD

## 2020-11-09 NOTE — Telephone Encounter (Signed)
Called patient and left message to call [facility].  If and when patient calls back, please ask him if he is still using the Flomax and if he has seen the urologist?  .Ozella Almond, [place]

## 2020-11-17 ENCOUNTER — Encounter: Payer: Self-pay | Admitting: Physician Assistant

## 2020-11-17 ENCOUNTER — Ambulatory Visit (INDEPENDENT_AMBULATORY_CARE_PROVIDER_SITE_OTHER): Payer: Medicare HMO | Admitting: Physician Assistant

## 2020-11-17 DIAGNOSIS — Z89511 Acquired absence of right leg below knee: Secondary | ICD-10-CM | POA: Diagnosis not present

## 2020-11-17 NOTE — Progress Notes (Signed)
Office Visit Note   Patient: Adam Ray           Date of Birth: [birth year]           MRN: [id number] Visit Date: 11/17/2020              Requested by: [name], [place],  [place] [id number] PCP: [name], MD  Chief Complaint  Patient presents with  . Right Leg - Routine Post Op    BKA follow up       HPI: Patient is almost 1 year status post right below-knee amputation.  He comes in today because he is having some pressure pain when his amputation stump is in his prosthetic.  This is his first prosthetic since his amputation.  Assessment & Plan: Visit Diagnoses: No diagnosis found.  Plan: Patient will visit with [employer] to me the prosthetic.  We will see if there can be some padding placed.  I spoke to he and his wife and discussed with him that often because of the change of the stump a new socket will have to be fashioned.  If this is the case they will follow-up with [place]  Follow-Up Instructions: No follow-ups on file.   Ortho Exam  Patient is alert, oriented, no adenopathy, well-dressed, normal affect, normal respiratory effort.  Right below-knee amputation stump is completely healed.  There is no sign of breakdown in the skin.  He does have some tenderness over the distal tibia.  I suspect this is from volume loss.  No ascending cellulitis  Imaging: No results found. No images are attached to the encounter.  Labs: Lab Results  Component Value Date   HGBA1C 11.3 (A) 08/10/2020   HGBA1C 11.5 (A) 05/08/2020   HGBA1C 8.7 (H) 01/19/2020   ESRSEDRATE 54 (H) 01/01/2020   ESRSEDRATE 8 01/29/2016   ESRSEDRATE 9 04/11/2012   CRP 2.6 (H) 01/29/2016   CRP 0.5 02/28/2014   REPTSTATUS 01/18/2020 FINAL 01/17/2020   REPTSTATUS 01/22/2020 FINAL 01/17/2020   REPTSTATUS 01/22/2020 FINAL 01/17/2020   GRAMSTAIN  04/11/2012    NO WBC SEEN RARE SQUAMOUS EPITHELIAL CELLS PRESENT MODERATE GRAM POSITIVE COCCI IN PAIRS RARE GRAM  NEGATIVE RODS   GRAMSTAIN  04/11/2012    NO WBC SEEN RARE SQUAMOUS EPITHELIAL CELLS PRESENT MODERATE GRAM POSITIVE COCCI IN PAIRS RARE GRAM NEGATIVE RODS   CULT (A) 01/17/2020    <10,000 COLONIES/mL INSIGNIFICANT GROWTH Performed at [facility], [address], [place], [place] [address]    CULT  01/17/2020    NO GROWTH 5 DAYS Performed at [facility] [address] [place], [place] [address]    CULT  01/17/2020    NO GROWTH 5 DAYS Performed at [facility] [address], [place], [place] [address]    LABORGA NO GROWTH 2 DAYS 07/19/2014     Lab Results  Component Value Date   ALBUMIN 3.7 02/23/2020   ALBUMIN 3.0 (L) 01/18/2020   ALBUMIN 3.4 (L) 01/17/2020    No results found for: MG No results found for: VD25OH  No results found for: PREALBUMIN CBC EXTENDED Latest Ref Rng & Units 02/23/2020 01/27/2020 01/19/2020  WBC 4.0 - 10.5 K/uL 3.9(L) 3.1(L) 3.2(L)  RBC 4.22 - 5.81 MIL/uL 3.97(L) 3.52(L) 3.42(L)  HGB 13.0 - 17.0 g/dL 11.6(L) 10.5(L) 9.9(L)  HCT 39.0 - 52.0 % 35.9(L) 31.2(L) 30.4(L)  PLT 150 - 400 K/uL 178 132(L) 167  NEUTROABS 1.7 -  7.7 K/uL - - -  LYMPHSABS 0.7 - 4.0 K/uL - - -     There is no height or weight on file to calculate BMI.  Orders:  No orders of the defined types were placed in this encounter.  No orders of the defined types were placed in this encounter.    Procedures: No procedures performed  Clinical Data: No additional findings.  ROS:  All other systems negative, except as noted in the HPI. Review of Systems  Objective: Vital Signs: There were no vitals taken for this visit.  Specialty Comments:  No specialty comments available.  PMFS History: Patient Active Problem List   Diagnosis Date Noted  . Urinary incontinence 08/10/2020  . Viral upper respiratory illness 07/13/2020  . Blurred vision, bilateral 07/13/2020  . Hyperglycemia 05/12/2020  . Increased urinary frequency 05/09/2020  . Dehiscence of  amputation stump ([place])   . Coronary artery disease 01/18/2020  . AKI (acute kidney injury) ([place]) 01/17/2020  . Nocturnal leg cramps 10/28/2019  . Hyperlipidemia 05/26/2019  . Edema leg 12/29/2018  . STEMI involving left circumflex coronary artery ([place]) 07/12/2018  . Status post transmetatarsal amputation of foot, right ([place]) 07/08/2018  . Limited mobility 05/12/2018  . Gingival foreign body 07/22/2017  . Anxiety state 06/02/2017  . Leg swelling 01/29/2017  . Idiopathic chronic venous hypertension of both lower extremities with inflammation 09/24/2016  . Testicular mass 04/18/2016  . Subacute osteomyelitis, right ankle and foot ([place]) 01/29/2016  . Diabetic polyneuropathy associated with type 2 diabetes mellitus ([place])   . Type 2 diabetes mellitus with neurologic complication, with long-term current use of insulin ([place]) 12/08/2015  . Pulmonary nodule 02/01/2015  . Depression 12/09/2013  . Warts, genital 08/20/2013  . Diabetic retinopathy ([place]) 01/28/2013  . Sleep apnea 09/06/2010  . 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 Essential hypertension 11/09/2008  . COPD, mild ([place]) 10/06/2006  . SICKLE-CELL TRAIT 04/26/2006  . ERECTILE DYSFUNCTION 04/26/2006  . Tobacco abuse 04/26/2006  . PAD (peripheral artery disease) ([place]) 04/26/2006  . ALLERGIC RHINITIS 04/26/2006   Past Medical History:  Diagnosis Date  . Allergy   . Arthritis   . Bronchitis   . Cataract   . CHF (congestive heart failure) ([place])   . Chronic kidney disease   . Claudication [facility])    right foot ray resection  . Colon polyps    hyperplastic  . COPD (chronic obstructive pulmonary disease) ([place])   . Coronary artery disease   . Diabetes mellitus    type II  . Genital warts   . Gout   . Hyperlipidemia   . Hypertension   . Myocardial infarction ([place])   . Osteomyelitis of third toe of right foot ([place])   . Pneumonia   . Status post amputation of toe of right foot ([place]) 09/24/2016    Family History  Problem Relation  Age of Onset  . Diabetes Mother   . Stroke Mother   . Heart failure Father   . Colon cancer Neg Hx   . Esophageal cancer Neg Hx   . Rectal cancer Neg Hx   . Stomach cancer Neg Hx     Past Surgical History:  Procedure Laterality Date  . AMPUTATION Right 01/31/2016   Procedure: Right 2nd Toe Amputation;  Surgeon: Newt Minion, MD;  Location: [place];  Service: Orthopedics;  Laterality: Right;  . AMPUTATION Right 07/08/2018   Procedure: RIGHT TRANSMETATARSAL AMPUTATION;  Surgeon: Newt Minion, MD;  Location: [place];  Service: Orthopedics;  Laterality: Right;  . AMPUTATION Right 01/05/2020   Procedure: RIGHT BELOW KNEE AMPUTATION;  Surgeon: Newt Minion, MD;  Location: [place];  Service: Orthopedics;  Laterality: Right;  . AMPUTATION Right 02/23/2020   Procedure: RIGHT BELOW KNEE AMPUTATION REVISION;  Surgeon: Newt Minion, MD;  Location: [place];  Service: Orthopedics;  Laterality: Right;  . CATARACT EXTRACTION     right eye  . COLONOSCOPY    . CORONARY STENT INTERVENTION N/A 07/12/2018   Procedure: CORONARY STENT INTERVENTION;  Surgeon: Troy Sine, MD;  Location: [place] [facility];  Service: Cardiovascular;  Laterality: N/A;  . CORONARY/GRAFT ACUTE MI REVASCULARIZATION N/A 07/12/2018   Procedure: Coronary/Graft Acute MI Revascularization;  Surgeon: Troy Sine, MD;  Location: [place] [facility];  Service: Cardiovascular;  Laterality: N/A;  . I & D EXTREMITY  04/11/2012   Procedure: IRRIGATION AND DEBRIDEMENT EXTREMITY;  Surgeon: Wylene Simmer, MD;  Location: [place];  Service: Orthopedics;  Laterality: Right;  . LEFT HEART CATH AND CORONARY ANGIOGRAPHY N/A 07/12/2018   Procedure: LEFT HEART CATH AND CORONARY ANGIOGRAPHY;  Surgeon: Troy Sine, MD;  Location: [place] [facility];  Service: Cardiovascular;  Laterality: N/A;  . Surgery left great toe    . Tear ducts bilateral eyes    . TRANSMETATARSAL AMPUTATION Right 07/08/2018   Social History   Occupational History  . Occupation:  disabled  Tobacco Use  . Smoking status: Former Smoker    Packs/day: 0.30    Years: 48.00    Pack years: 14.40    Types: Cigars, Cigarettes    Start date: 07/02/1963    Quit date: 06/01/2019    Years since quitting: 1.4  . Smokeless tobacco: Former Network engineer  . Vaping Use: Former  Substance and Sexual Activity  . Alcohol use: Yes    Alcohol/week: 0.0 standard drinks  . Drug use: No  . Sexual activity: Not on file

## 2020-11-21 ENCOUNTER — Encounter: Payer: Self-pay | Admitting: Podiatry

## 2020-11-21 ENCOUNTER — Other Ambulatory Visit: Payer: Self-pay

## 2020-11-21 ENCOUNTER — Ambulatory Visit (INDEPENDENT_AMBULATORY_CARE_PROVIDER_SITE_OTHER): Payer: Medicare HMO | Admitting: Podiatry

## 2020-11-21 DIAGNOSIS — E1151 Type 2 diabetes mellitus with diabetic peripheral angiopathy without gangrene: Secondary | ICD-10-CM

## 2020-11-21 DIAGNOSIS — S88119A Complete traumatic amputation at level between knee and ankle, unspecified lower leg, initial encounter: Secondary | ICD-10-CM

## 2020-11-21 DIAGNOSIS — B351 Tinea unguium: Secondary | ICD-10-CM

## 2020-11-21 DIAGNOSIS — M79675 Pain in left toe(s): Secondary | ICD-10-CM | POA: Diagnosis not present

## 2020-11-21 DIAGNOSIS — I739 Peripheral vascular disease, unspecified: Secondary | ICD-10-CM

## 2020-11-21 NOTE — Progress Notes (Signed)
This patient returns to my office for at risk foot care.  This patient requires this care by a professional since this patient will be at risk due to having diabetic neuropathy and PAD.    This patient is unable to cut nails himself since the patient cannot reach his nails.These nails are painful walking and wearing shoes. He presents to the office with his wife. This patient presents for at risk foot care today.  General Appearance  Alert, conversant and in no acute stress.  Vascular  Dorsalis pedis and posterior tibial  pulses are palpable  Left..  Capillary return is within normal limits . Temperature is within normal limits  bilaterally.  Neurologic  Senn-Weinstein monofilament wire test within normal limits  left. Muscle power within normal limits left.  Nails Thick disfigured discolored nails with subungual debris  from hallux to fifth toes left. No evidence of bacterial infection or drainage left.  Orthopedic  No limitations of motion  feet .  No crepitus or effusions noted.  No bony pathology or digital deformities noted. BK amputation right.  Skin  normotropic skin with no porokeratosis noted .  No signs of infections or ulcers noted.     Onychomycosis   Pain in left toes  Consent was obtained for treatment procedures.   Mechanical debridement of nails 1-5  left performed with a nail nipper.  Filed with dremel without incident.    Return office visit   3 months                  Told patient to return for periodic foot care and evaluation due to potential at risk complications.   Gardiner Barefoot DPM

## 2020-11-23 ENCOUNTER — Telehealth: Payer: Self-pay | Admitting: Family Medicine

## 2020-11-23 NOTE — Telephone Encounter (Signed)
Patients wife is calling saying he is needing bloodwork done per the [place].

## 2020-11-24 NOTE — Telephone Encounter (Signed)
[facility] should be able to order it and he can go to [facility] to get it. I don't know what labs they need - I never receive any correspondence from them. Please let patient/wife know  Thanks [name], MD

## 2020-11-28 NOTE — Telephone Encounter (Signed)
Left voice message for patient to contact [facility] so that they can order blood work for him.  Ozella Almond, CMA'

## 2020-12-01 ENCOUNTER — Ambulatory Visit (INDEPENDENT_AMBULATORY_CARE_PROVIDER_SITE_OTHER): Payer: Medicare HMO | Admitting: Physician Assistant

## 2020-12-01 ENCOUNTER — Other Ambulatory Visit (INDEPENDENT_AMBULATORY_CARE_PROVIDER_SITE_OTHER): Payer: Medicare HMO

## 2020-12-01 ENCOUNTER — Encounter: Payer: Self-pay | Admitting: Physician Assistant

## 2020-12-01 VITALS — BP 102/60 | HR 74 | Ht 69.0 in | Wt 165.0 lb

## 2020-12-01 DIAGNOSIS — K5900 Constipation, unspecified: Secondary | ICD-10-CM

## 2020-12-01 DIAGNOSIS — R350 Frequency of micturition: Secondary | ICD-10-CM

## 2020-12-01 DIAGNOSIS — R32 Unspecified urinary incontinence: Secondary | ICD-10-CM

## 2020-12-01 LAB — CBC WITH DIFFERENTIAL/PLATELET
Basophils Absolute: 0 10*3/uL (ref 0.0–0.1)
Basophils Relative: 0.5 % (ref 0.0–3.0)
Eosinophils Absolute: 0 10*3/uL (ref 0.0–0.7)
Eosinophils Relative: 0.2 % (ref 0.0–5.0)
HCT: 38.8 % — ABNORMAL LOW (ref 39.0–52.0)
Hemoglobin: 13.1 g/dL (ref 13.0–17.0)
Lymphocytes Relative: 14 % (ref 12.0–46.0)
Lymphs Abs: 0.9 10*3/uL (ref 0.7–4.0)
MCHC: 33.8 g/dL (ref 30.0–36.0)
MCV: 83.3 fl (ref 78.0–100.0)
Monocytes Absolute: 0.5 10*3/uL (ref 0.1–1.0)
Monocytes Relative: 8.3 % (ref 3.0–12.0)
Neutro Abs: 4.8 10*3/uL (ref 1.4–7.7)
Neutrophils Relative %: 77 % (ref 43.0–77.0)
Platelets: 104 10*3/uL — ABNORMAL LOW (ref 150.0–400.0)
RBC: 4.66 Mil/uL (ref 4.22–5.81)
RDW: 14 % (ref 11.5–15.5)
WBC: 6.3 10*3/uL (ref 4.0–10.5)

## 2020-12-01 NOTE — Patient Instructions (Addendum)
If you are age 66 or older, your body mass index should be between 23-30. Your Body mass index is 24.37 kg/m. If this is out of the aforementioned range listed, please consider follow up with your Primary Care Provider. _____________________________________________________  The [facility] providers would like to encourage you to use [facility] to communicate with providers for non-urgent requests or questions.  Due to long hold times on the telephone, sending your provider a message by [facility] may be a faster and more efficient way to get a response.  Please allow 48 business hours for a response.  Please remember that this is for non-urgent requests.   START Miralax 1 capful in 8 ounces or water or juice daily as needed for constipation.  Try using Gas-x 1-2 tablets with each meal for gas or burping.  Your provider has requested that you go to the basement level for lab work before leaving today. Press "B" on the elevator. The lab is located at the first door on the left as you exit the elevator.  You have been referred to [facility] They are located at: [address], [place], [place] [address] You can contact them at: [phone] You have been scheduled for January 02, 2021 at 1:45 pm [facility] will be mailing some information to you.  Thank you for entrusting me with your care and choosing [facility].  Amy Esterqwood, PA-C

## 2020-12-01 NOTE — Progress Notes (Signed)
Subjective:    Patient ID: Adam Ray, male    DOB: [birth year], 65 y.o.   MRN: [id number]  HPI [name] is a 65 year old African-American male, established with Dr. Henrene Ray who was last seen in our office in 2018 when he had undergone colonoscopy.  He comes back in today with complaints of mild constipation.  On further questioning his main concern is that of significant urinary frequency and some urinary incontinence which has been somewhat progressive over the past year He and his wife are concerned about his prostate and feel he needs that evaluated. Patient denies any dysuria or hematuria.  He is not having any abdominal pain.  His wife says that he has a lot of gas and belching.  Bowel movements for the most part have been normal though occasionally he gets "backed up" he is not using any regular laxatives.  He has not had any melena or hematochezia.  No regular heartburn or indigestion. PSA had been done in February 2022 and was normal He has not seen a urologist. Colonoscopy January 2018 with finding of a 3 mm rectal polyp which was hyperplastic and otherwise negative exam, indicated for 10-year interval follow-up. Patient has multiple comorbidities including peripheral arterial disease, he is status post right BKA, history of coronary artery disease status post MI, and is maintained on Brilinta and aspirin.  COPD, hypertension, insulin-dependent diabetes, most recent echo 2020 EF of 65 to 70%.  Review of Systems Pertinent positive and negative review of systems were noted in the above HPI section.  All other review of systems was otherwise negative.  Outpatient Encounter Medications as of 12/01/2020  Medication Sig  . albuterol (VENTOLIN HFA) 108 (90 Base) MCG/ACT inhaler Inhale 2 puffs into the lungs every 4 (four) hours as needed for wheezing or shortness of breath.  [name] aspirin 81 MG chewable tablet Chew 81 mg by mouth daily.  . baclofen (LIORESAL) 10 MG tablet Take 10 mg by mouth 2 (two)  times daily.  . calcium carbonate (OSCAL) 1500 (600 Ca) MG TABS tablet Take 600 mg of elemental calcium by mouth daily.  . Cholecalciferol (VITAMIN D3) 50 MCG (2000 UT) TABS Take 2,000 Units by mouth daily.  [name] doxycycline (VIBRA-TABS) 100 MG tablet Take 1 tablet (100 mg total) by mouth 2 (two) times daily.  . furosemide (LASIX) 20 MG tablet Take by mouth.  . insulin aspart (NOVOLOG) 100 UNIT/ML injection Inject 35 Units into the skin 3 (three) times daily before meals.  . insulin glargine (LANTUS) 100 UNIT/ML injection Inject 55 Units into the skin 2 (two) times daily.  . metoprolol tartrate (LOPRESSOR) 25 MG tablet Take 25 mg by mouth 2 (two) times daily.  . Multiple Vitamin (MULTIVITAMIN WITH MINERALS) TABS tablet Take 1 tablet by mouth daily.  . nitroGLYCERIN (NITROSTAT) 0.4 MG SL tablet Place 1 tablet (0.4 mg total) under the tongue every 5 (five) minutes x 3 doses as needed for chest pain.  . rosuvastatin (CRESTOR) 40 MG tablet Take 1 tablet (40 mg total) by mouth daily at 6 PM.  . sildenafil (VIAGRA) 100 MG tablet Take 100 mg by mouth daily as needed for erectile dysfunction.  . tamsulosin (FLOMAX) 0.4 MG CAPS capsule Take 1 capsule (0.4 mg total) by mouth daily.  . ticagrelor (BRILINTA) 90 MG TABS tablet Take 1 tablet (90 mg total) by mouth 2 (two) times daily.  . [DISCONTINUED] levofloxacin (LEVAQUIN) 750 MG tablet Take 1 tablet (750 mg total) by mouth daily. For 14 days  No facility-administered encounter medications on file as of 12/01/2020.   Allergies  Allergen Reactions  . Codeine Other (See Comments)    Heart attack.   Patient Active Problem List   Diagnosis Date Noted  . Urinary incontinence 08/10/2020  . Viral upper respiratory illness 07/13/2020  . Blurred vision, bilateral 07/13/2020  . Hyperglycemia 05/12/2020  . Increased urinary frequency 05/09/2020  . Dehiscence of amputation stump (HCC)   . Coronary artery disease 01/18/2020  . AKI (acute kidney injury) (HCC)  01/17/2020  . Nocturnal leg cramps 10/28/2019  . Hyperlipidemia 05/26/2019  . Edema leg 12/29/2018  . STEMI involving left circumflex coronary artery (HCC) 07/12/2018  . Status post transmetatarsal amputation of foot, 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/2018  . Limited mobility 05/12/2018  . Gingival foreign body 07/22/2017  . Anxiety state 06/02/2017  . Leg swelling 01/29/2017  . Idiopathic chronic venous hypertension of both lower extremities with inflammation 09/24/2016  . Testicular mass 04/18/2016  . Subacute osteomyelitis, right ankle and foot (HCC) 01/29/2016  . Diabetic polyneuropathy associated with type 2 diabetes mellitus (HCC)   . Type 2 diabetes mellitus with neurologic complication, with long-term current use of insulin (HCC) 12/08/2015  . Pulmonary nodule 02/01/2015  . Depression 12/09/2013  . Warts, genital 08/20/2013  . Diabetic retinopathy (HCC) 01/28/2013  . Sleep apnea 09/06/2010  . 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 Essential hypertension 11/09/2008  . COPD, mild (HCC) 10/06/2006  . SICKLE-CELL TRAIT 04/26/2006  . ERECTILE DYSFUNCTION 04/26/2006  . Tobacco abuse 04/26/2006  . PAD (peripheral artery disease) (HCC) 04/26/2006  . ALLERGIC RHINITIS 04/26/2006   Social History   Socioeconomic History  . Marital status: Married    Spouse name: Not on file  . Number of children: 3  . Years of education: Not on file  . Highest education level: Not on file  Occupational History  . Occupation: disabled  Tobacco Use  . Smoking status: Former Smoker    Packs/day: 0.30    Years: 48.00    Pack years: 14.40    Types: Cigars, Cigarettes    Start date: 07/02/1963    Quit date: 06/01/2019    Years since quitting: 1.5  . Smokeless tobacco: Former User  Vaping Use  . Vaping Use: Former  Substance and Sexual Activity  . Alcohol use: Yes    Alcohol/week: 0.0 standard drinks  . Drug use: No  . Sexual activity: Not on file  Other Topics Concern  . Not on file  Social History  Narrative  . Not on file   Social Determinants of Health   Financial Resource Strain: Not on file  Food Insecurity: Not on file  Transportation Needs: Not on file  Physical Activity: Not on file  Stress: Not on file  Social Connections: Not on file  Intimate Partner Violence: Not on file    Mr. Kovatch's family history includes Diabetes in his brother, mother, and sister; Heart attack in his father; Heart disease in his brother; Heart failure in his father; Stroke in his mother.      Objective:    Vitals:   12/01/20 1445  BP: 102/60  Pulse: 74    Physical Exam Well-developed chronically ill-appearing older African-American male in a wheelchair, accompanied by his wife, in no acute distress.  Height, Weight, 165 BMI 24.3  HEENT; nontraumatic normocephalic, EOMI, PE R LA, sclera anicteric. Oropharynx; not examined today Neck; supple, no JVD Cardiovascular; regular rate and rhythm with S1-S2, no murmur rub or gallop Pulmonary; Clear bilaterally Abdomen;   soft, nontender, nondistended, no palpable mass or hepatosplenomegaly, bowel sounds are active Rectal; not done Skin; benign exam, no jaundice rash or appreciable lesions Extremities; prosthesis right lower extremity below the knee Neuro/Psych; alert and oriented x4, grossly nonfocal mood and affect appropriate       Assessment & Plan:   #92 65 year old male with multiple comorbidities with primary concern of urinary frequency and incontinence somewhat progressive over the past year.  Concerns for underlying prostate disease  #2 mild constipation #3 colon cancer surveillance-up-to-date with colonoscopy January 2018 negative with exception of 1 diminutive hyperplastic polyp indicated for 10-year interval follow-up 4.  Hypertension 5.  Peripheral arterial disease status post right BKA 6.  Coronary artery disease status post MI maintained on Brilinta and aspirin 7.  COPD 8.  Adult onset diabetes mellitus  insulin-dependent  Plan based on labs today, CBC and c-Met Discussed use of MiraLAX 17 g in 8 ounces of water daily as needed Trial of Gas-X 1-2 with meals as needed for gas. Low gas diet, avoidance of artificial sweeteners and carbonated beverages We will arrange for urology consultation with [facility].  Ladon Heney Genia Harold PA-C 12/01/2020   Cc: [name], MD

## 2020-12-02 NOTE — Progress Notes (Signed)
Assessment and plan noted ?

## 2020-12-03 NOTE — Progress Notes (Signed)
   Subjective:   Patient ID: Adam Ray    DOB: Jul 10, 1955, 65 y.o. male   MRN: [id number]  Adam Ray is a 65 y.o. male with a history of bicuspid aortic valve, CAD, HTN, PAD, h/o STEMI, allergic rhinitis, COPD, OSA, diabetes with polyneuropathy and retinopathy, genital warts, anxiety, history of amputation, depression, ED, hyperlipidemia, tobacco abuse here for diabetes follow-up  Diabetes: Last three A1C's below. Currently prescribed Lantus 55U BID, Novolog 35U TID but currently only takes Lantus 50U QD and Novolog 40U TID which was changed by provider at [facility] per patient. Endorses compliance. Notes CBGs range 400's every day. Checks his BS 4 times a day. Denies any hypoglycemia. Denies any polyuria, polydipsia, polyphagia. Due for  Pneumonia vaccine  Lab Results  Component Value Date   HGBA1C 11.9 (A) 12/06/2020   HGBA1C 11.3 (A) 08/10/2020   HGBA1C 11.5 (A) 05/08/2020    HTN:  BP: 132/74 today. Currently on Metoprolol 25mg  BID. Endorses compliance. Non-smoker. Denies any chest pain, SOB, vision changes, or headaches.   Lab Results  Component Value Date   CREATININE 1.07 12/06/2020   CREATININE 1.17 12/01/2020   CREATININE 1.33 (H) 05/15/2020    HLD: Last lipid panel below. Currently on Crestor 40mg  QD. Endorses compliance. Denies any muscles aches or weakness.   Lab Results  Component Value Date   CHOL 97 (L) 12/06/2020   HDL 39 (L) 12/06/2020   LDLCALC 33 12/06/2020   LDLDIRECT 78 09/11/2011   TRIG 143 12/06/2020   CHOLHDL 2.5 12/06/2020    Review of Systems:  Per HPI.   Objective:   BP 132/74   Pulse 82   Ht 5\' 10"  (1.778 m)   Wt 156 lb 12.8 oz (71.1 kg)   SpO2 96%   BMI 22.50 kg/m  Vitals and nursing note reviewed.  General: pleasant older male, sitting comfortably in exam chair, well nourished, well developed, in no acute distress with non-toxic appearance Resp: breathing comfortably on room air, speaking in full sentences MSK: gait normal Neuro: Alert  and oriented, speech normal  Assessment & Plan:   Essential hypertension Chronic. Slightly above goal. No changes made today. Continue Metoprolol  Follow up with PCP in 1 month to monitor BMP to monitor kidney function  Type 2 diabetes mellitus with neurologic complication, with long-term current use of insulin (HCC) Chronic, uncontrolled. Recommended increase back to Lantus 50U BID  Continue Novolog 40U TID Appears he's been on SGLT-2 in past but developed AKI thus this was discontinued. Appears he was also on Metformin in past but self discontinued this. May benefit from GLP-1?  Will start with this adjustment and have him follow up with PCP at end of month for continued adjustments. Encouraged to keep log.   Hyperlipidemia Chronic. - lipid panel today - continue Crestor 40mg  QD  Orders Placed This Encounter  Procedures   Lipid Panel   CBC   Comprehensive metabolic panel   POCT glycosylated hemoglobin (Hb A1C)   No orders of the defined types were placed in this encounter.     Mina Marble, DO PGY-3, [facility] 12/07/2020 8:18 PM

## 2020-12-04 LAB — COMPREHENSIVE METABOLIC PANEL
ALT: 19 U/L (ref 0–53)
AST: 16 U/L (ref 0–37)
Albumin: 4.3 g/dL (ref 3.5–5.2)
Alkaline Phosphatase: 99 U/L (ref 39–117)
BUN: 21 mg/dL (ref 6–23)
CO2: 25 mEq/L (ref 19–32)
Calcium: 9.2 mg/dL (ref 8.4–10.5)
Chloride: 93 mEq/L — ABNORMAL LOW (ref 96–112)
Creatinine, Ser: 1.17 mg/dL (ref 0.40–1.50)
GFR: 65.29 mL/min (ref >60.00)
Glucose, Bld: 508 mg/dL (ref 70–99)
Potassium: 4.3 mEq/L (ref 3.5–5.1)
Sodium: 130 mEq/L — ABNORMAL LOW (ref 135–145)
Total Bilirubin: 0.8 mg/dL (ref 0.2–1.2)
Total Protein: 7.2 g/dL (ref 6.0–8.3)

## 2020-12-05 ENCOUNTER — Other Ambulatory Visit: Payer: Self-pay

## 2020-12-05 ENCOUNTER — Ambulatory Visit (INDEPENDENT_AMBULATORY_CARE_PROVIDER_SITE_OTHER): Payer: Medicare HMO | Admitting: Student in an Organized Health Care Education/Training Program

## 2020-12-05 VITALS — BP 105/60 | HR 58

## 2020-12-05 DIAGNOSIS — E1141 Type 2 diabetes mellitus with diabetic mononeuropathy: Secondary | ICD-10-CM | POA: Diagnosis not present

## 2020-12-05 DIAGNOSIS — R059 Cough, unspecified: Secondary | ICD-10-CM | POA: Diagnosis not present

## 2020-12-05 DIAGNOSIS — R69 Illness, unspecified: Secondary | ICD-10-CM

## 2020-12-05 DIAGNOSIS — Z794 Long term (current) use of insulin: Secondary | ICD-10-CM | POA: Diagnosis not present

## 2020-12-05 LAB — GLUCOSE, POCT (MANUAL RESULT ENTRY): POC Glucose: 217 mg/dl — AB (ref 70–99)

## 2020-12-05 MED ORDER — FLUTICASONE PROPIONATE 50 MCG/ACT NA SUSP: 1.0000 | Freq: Every day | NASAL | 1 refills | Status: DC

## 2020-12-05 NOTE — Progress Notes (Signed)
   SUBJECTIVE:   CHIEF COMPLAINT / HPI: cough, fever, not feeling well.  Illness- about 2 weeks of generalized fatigue. Got worse in the past 3 days. No fevers. Positive for chills. Hasn't taken any treatments at home yet. Not eating much. Nausea and "clear vomiting". Having regular bowel movements. No rash. Some mild SOB with cough. Cough is productive of clear mucus. No known sick contacts.   Hyperglycemia-  saw kidney doctor today and says his BS was in 500s  385 BS at home today. Taken 40u insulin today. Takes it typically TID.   OBJECTIVE:   There were no vitals taken for this visit.  Physical Exam Vitals and nursing note reviewed. Exam conducted with a chaperone present.  Constitutional:      General: He is not in acute distress.    Appearance: He is not ill-appearing, toxic-appearing or diaphoretic.  HENT:     Right Ear: Tympanic membrane, ear canal and external ear normal.     Left Ear: Tympanic membrane, ear canal and external ear normal.     Nose: Rhinorrhea (mild) present. Rhinorrhea is clear.     Mouth/Throat:     Mouth: Mucous membranes are moist. No oral lesions.     Pharynx: Oropharynx is clear.     Tonsils: No tonsillar exudate.  Cardiovascular:     Rate and Rhythm: Normal rate and regular rhythm.     Pulses: Normal pulses.     Heart sounds: Murmur heard.    Pulmonary:     Effort: Pulmonary effort is normal. No respiratory distress.     Breath sounds: Normal breath sounds. No stridor. No wheezing or rhonchi.  Musculoskeletal:     Cervical back: No tenderness.  Lymphadenopathy:     Cervical: No cervical adenopathy.  Skin:    General: Skin is warm and dry.     Capillary Refill: Capillary refill takes less than 2 seconds.  Neurological:     General: No focal deficit present.     Mental Status: He is alert and oriented to person, place, and time.    ASSESSMENT/PLAN:   Illness Non-specific feelings of being unwell which were difficult to differentiate in  patient with several comorbidities. Potentially a URVI. Confirmed that his blood sugars were not severely elevated and contributing. CBG 217.  Only significant finding on exam was mild clear rhinorrhea. Vitals otherwise normal. Recommended flonase for nasal congestion/drainage.  covid swab collected today. Follow up with PCP already scheduled soon.      [place]

## 2020-12-05 NOTE — Patient Instructions (Signed)
It was a pleasure to see you today!  To summarize our discussion for this visit:  I'm sorry to hear that you are not feeling well. It looks like you have a viral illness.   Please try a nasal steroid spray to help with your nasal congestion and drainage.   Increase your fluid intake.   Your blood sugars today were improved from this morning. Continue to monitor them at take your insulin as prescribed.  Some additional health maintenance measures we should update are: Health Maintenance Due  Topic Date Due  . Pneumococcal Vaccine 26-28 Years old (1 of 4 - PCV13) Never done  . Zoster Vaccines- Shingrix (1 of 2) Never done  . LIPID PANEL  10/27/2020  .    Call the clinic at [phone] if your symptoms worsen or you have any concerns.   Thank you for allowing me to take part in your care,  Dr. Doristine Mango  What You Can Do to Feel Better When You Have a Viral Illness Common Symptoms: runny eyes, muscle aches, throat irritation, ear pain, cough, sneezing, runny nose or congested nose, sinus pressure, headache, fever, fatigue.  For your cough, try these: . Teaspoon of honey either alone or mixed with warm water (must be older than one year old) . Tessalon pearls . Lozenges or hard candies . When resting and laying down, keep your head elevated. . Vicks/menthol rub topically on chest  For your congestion, try these:  . Steroid nasal spray such as fluticasone or budesonide- helps prevent swelling in the nasal passage . Guaifenesin (mucinex) - helps thin the mucus . Pseudoephedrine (sudafed) - helps dry up mucus . Steam- in a closed bathroom with hot shower running or a bedside humidifier . Drinking plenty of fluids to stay hydrated can help thin mucus  For your runny nose:  . Atrovent nasal spray- helps decrease the drainage (can lead to dryness if overdone) . Nasal rinses such as a netty pot or a bulb syringe using filtered water mixed with small amount of baking soda  and/or sea salt  For your fever:  . Acetaminophen (Tylenol) up to 4g per day for most people . Ibuprofen 600mg  up to three times per day for most people  For your sore throat: . Drinking either warm or cold liquids (whichever feels best to you) . Gargling salt water  Help prevent spreading of infection to others. Wendee Copp your hands frequently . Avoid crowded places . Wear a mask when in public . Get your regularly scheduled vaccinations as they are recommended by the CDC.  Fun facts: -Antibiotics treat bacteria and have no effect on viruses so are not helpful in the vast majority of upper respiratory illnesses which are caused by common cold or flu viruses.  -Generic over the counter (OTC) medications have the same active ingredients and effectiveness of the more expensive name-brand version. -Vaccines are available to prevent infection with several of the most infectious/deadly viruses.

## 2020-12-06 ENCOUNTER — Ambulatory Visit (INDEPENDENT_AMBULATORY_CARE_PROVIDER_SITE_OTHER): Payer: Medicare HMO | Admitting: Family Medicine

## 2020-12-06 ENCOUNTER — Other Ambulatory Visit: Payer: Self-pay

## 2020-12-06 VITALS — BP 132/74 | HR 82 | Ht 70.0 in | Wt 156.8 lb

## 2020-12-06 DIAGNOSIS — I10 Essential (primary) hypertension: Secondary | ICD-10-CM

## 2020-12-06 DIAGNOSIS — E782 Mixed hyperlipidemia: Secondary | ICD-10-CM

## 2020-12-06 DIAGNOSIS — Z794 Long term (current) use of insulin: Secondary | ICD-10-CM | POA: Diagnosis not present

## 2020-12-06 DIAGNOSIS — E1141 Type 2 diabetes mellitus with diabetic mononeuropathy: Secondary | ICD-10-CM

## 2020-12-06 DIAGNOSIS — R69 Illness, unspecified: Secondary | ICD-10-CM | POA: Insufficient documentation

## 2020-12-06 LAB — NOVEL CORONAVIRUS, NAA: SARS-CoV-2, NAA: NOT DETECTED

## 2020-12-06 LAB — POCT GLYCOSYLATED HEMOGLOBIN (HGB A1C): HbA1c, POC (controlled diabetic range): 11.9 % — AB (ref 0.0–7.0)

## 2020-12-06 LAB — SARS-COV-2, NAA 2 DAY TAT

## 2020-12-06 NOTE — Assessment & Plan Note (Addendum)
Non-specific feelings of being unwell which were difficult to differentiate in patient with several comorbidities. Potentially a URVI. Confirmed that his blood sugars were not severely elevated and contributing. CBG 217.  Only significant finding on exam was mild clear rhinorrhea. Vitals otherwise normal. Recommended flonase for nasal congestion/drainage.  covid swab collected today. Follow up with PCP already scheduled soon.

## 2020-12-06 NOTE — Patient Instructions (Signed)
It was a pleasure to see you today!  Thank you for choosing [facility] for your primary care.   Our plans for today were:  Diabetes:   Increase your Lantus 50 units twice a day  Continue the Novolog 40 units three times a day  Follow up with Dr. [name] on 12/28/20   We are checking some labs today, I will call you if they are abnormal will send you a MyChart message or a letter if they are normal.  If you do not hear about your labs in the next 2 weeks please let us know.  BRING ALL OF YOUR MEDICATIONS WITH YOU TO EVERY VISIT   Best Wishes,   Mina Marble, DO

## 2020-12-07 ENCOUNTER — Encounter: Payer: Self-pay | Admitting: Family Medicine

## 2020-12-07 LAB — CBC
Hematocrit: 39.8 % (ref 37.5–51.0)
Hemoglobin: 13.7 g/dL (ref 13.0–17.7)
MCH: 28 pg (ref 26.6–33.0)
MCHC: 34.4 g/dL (ref 31.5–35.7)
MCV: 81 fL (ref 79–97)
Platelets: 138 10*3/uL — ABNORMAL LOW (ref 150–450)
RBC: 4.9 x10E6/uL (ref 4.14–5.80)
RDW: 12.8 % (ref 11.6–15.4)
WBC: 5.5 10*3/uL (ref 3.4–10.8)

## 2020-12-07 LAB — COMPREHENSIVE METABOLIC PANEL
ALT: 34 IU/L (ref 0–44)
AST: 34 IU/L (ref 0–40)
Albumin/Globulin Ratio: 1.5 (ref 1.2–2.2)
Albumin: 4.1 g/dL (ref 3.8–4.8)
Alkaline Phosphatase: 139 IU/L — ABNORMAL HIGH (ref 44–121)
BUN/Creatinine Ratio: 17 (ref 10–24)
BUN: 18 mg/dL (ref 8–27)
Bilirubin Total: 0.4 mg/dL (ref 0.0–1.2)
CO2: 27 mmol/L (ref 20–29)
Calcium: 9.6 mg/dL (ref 8.6–10.2)
Chloride: 93 mmol/L — ABNORMAL LOW (ref 96–106)
Creatinine, Ser: 1.07 mg/dL (ref 0.76–1.27)
Globulin, Total: 2.8 g/dL (ref 1.5–4.5)
Glucose: 243 mg/dL — ABNORMAL HIGH (ref 65–99)
Potassium: 4.4 mmol/L (ref 3.5–5.2)
Sodium: 134 mmol/L (ref 134–144)
Total Protein: 6.9 g/dL (ref 6.0–8.5)
eGFR: 77 mL/min/{1.73_m2} (ref >59)

## 2020-12-07 LAB — LIPID PANEL
Chol/HDL Ratio: 2.5 ratio (ref 0.0–5.0)
Cholesterol, Total: 97 mg/dL — ABNORMAL LOW (ref 100–199)
HDL: 39 mg/dL — ABNORMAL LOW (ref >39)
LDL Chol Calc (NIH): 33 mg/dL (ref 0–99)
Triglycerides: 143 mg/dL (ref 0–149)
VLDL Cholesterol Cal: 25 mg/dL (ref 5–40)

## 2020-12-07 NOTE — Assessment & Plan Note (Signed)
Chronic, uncontrolled. Recommended increase back to Lantus 50U BID  Continue Novolog 40U TID Appears he's been on SGLT-2 in past but developed AKI thus this was discontinued. Appears he was also on Metformin in past but self discontinued this. May benefit from GLP-1?  Will start with this adjustment and have him follow up with PCP at end of month for continued adjustments. Encouraged to keep log.

## 2020-12-07 NOTE — Assessment & Plan Note (Signed)
Chronic. Slightly above goal. No changes made today. Continue Metoprolol  Follow up with PCP in 1 month to monitor BMP to monitor kidney function

## 2020-12-07 NOTE — Assessment & Plan Note (Signed)
Chronic. - lipid panel today - continue Crestor 40mg  QD

## 2020-12-11 ENCOUNTER — Encounter (HOSPITAL_COMMUNITY): Payer: Self-pay | Admitting: Emergency Medicine

## 2020-12-11 ENCOUNTER — Ambulatory Visit (HOSPITAL_COMMUNITY)
Admission: EM | Admit: 2020-12-11 | Discharge: 2020-12-11 | Disposition: A | Discharge status: Home / Self Care | Payer: No Typology Code available for payment source | Attending: Family Medicine | Admitting: Family Medicine

## 2020-12-11 ENCOUNTER — Other Ambulatory Visit: Payer: Self-pay

## 2020-12-11 DIAGNOSIS — M12811 Other specific arthropathies, not elsewhere classified, right shoulder: Secondary | ICD-10-CM

## 2020-12-11 MED ORDER — TRAMADOL HCL 50 MG PO TABS: 50.0000 mg | ORAL_TABLET | Freq: Three times a day (TID) | ORAL | 0 refills | Status: DC | PRN

## 2020-12-11 NOTE — Discharge Instructions (Addendum)
Please try a warm wash cloth  Please try bengay or capsaicin  Please try the tramadol for severe pain.  Please follow up if your pain continues.

## 2020-12-11 NOTE — ED Provider Notes (Signed)
[place]    CSN: [id number] Arrival date & time: 12/11/20  1629      History   Chief Complaint Chief Complaint  Patient presents with   Shoulder Pain    HPI Adam Ray is a 65 y.o. male presenting with right shoulder pain.  The pain is been severe in nature.  Has not had any new or different activities.  Pain seems to initiate at the shoulder and radiate to the neck..   Independent review of the right shoulder x-ray from 2016 shows no significant glenohumeral disease.  HPI  Past Medical History:  Diagnosis Date   Allergy    Arthritis    Bronchitis    Cataract    CHF (congestive heart failure) (HCC)    Chronic kidney disease    Claudication ([place])    right foot ray resection   Colon polyps    hyperplastic   COPD (chronic obstructive pulmonary disease) (HCC)    Coronary artery disease    Diabetes mellitus    type II   Genital warts    Gout    Hyperlipidemia    Hypertension    Myocardial infarction ([place])    Osteomyelitis of third toe of right foot ([place])    Pneumonia    Status post amputation of toe of right foot ([place]) 09/24/2016    Patient Active Problem List   Diagnosis Date Noted   Illness 12/06/2020   Urinary incontinence 08/10/2020   Blurred vision, bilateral 07/13/2020   Increased urinary frequency 05/09/2020   Dehiscence of amputation stump ([place])    Coronary artery disease 01/18/2020   Nocturnal leg cramps 10/28/2019   Hyperlipidemia 05/26/2019   Edema leg 12/29/2018   STEMI involving left circumflex coronary artery ([place]) 07/12/2018   Status post transmetatarsal amputation of foot, right ([place]) 07/08/2018   Limited mobility 05/12/2018   Gingival foreign body 07/22/2017   Anxiety state 06/02/2017   Leg swelling 01/29/2017   Idiopathic chronic venous hypertension of both lower extremities with inflammation 09/24/2016   Testicular mass 04/18/2016   Subacute osteomyelitis, right ankle and foot ([place]) 01/29/2016   Diabetic polyneuropathy  associated with type 2 diabetes mellitus ([place])    Type 2 diabetes mellitus with neurologic complication, with long-term current use of insulin ([place]) 12/08/2015   Pulmonary nodule 02/01/2015   Depression 12/09/2013   Warts, genital 08/20/2013   Diabetic retinopathy ([place]) 01/28/2013   Sleep apnea 09/06/2010   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   Essential hypertension 11/09/2008   COPD, mild ([place]) 10/06/2006   SICKLE-CELL TRAIT 04/26/2006   ERECTILE DYSFUNCTION 04/26/2006   Tobacco abuse 04/26/2006   PAD (peripheral artery disease) ([place]) 04/26/2006   ALLERGIC RHINITIS 04/26/2006    Past Surgical History:  Procedure Laterality Date   AMPUTATION Right 01/31/2016   Procedure: Right 2nd Toe Amputation;  Surgeon: Newt Minion, MD;  Location: [facility];  Service: Orthopedics;  Laterality: Right;   AMPUTATION Right 07/08/2018   Procedure: RIGHT TRANSMETATARSAL AMPUTATION;  Surgeon: Newt Minion, MD;  Location: [facility];  Service: Orthopedics;  Laterality: Right;   AMPUTATION Right 01/05/2020   Procedure: RIGHT BELOW KNEE AMPUTATION;  Surgeon: Newt Minion, MD;  Location: [facility];  Service: Orthopedics;  Laterality: Right;   AMPUTATION Right 02/23/2020   Procedure: RIGHT BELOW KNEE AMPUTATION REVISION;  Surgeon: Newt Minion, MD;  Location: [facility];  Service: Orthopedics;  Laterality: Right;   CATARACT EXTRACTION     right eye   COLONOSCOPY  CORONARY STENT INTERVENTION N/A 07/12/2018   Procedure: CORONARY STENT INTERVENTION;  Surgeon: Troy Sine, MD;  Location: [facility];  Service: Cardiovascular;  Laterality: N/A;   CORONARY/GRAFT ACUTE MI REVASCULARIZATION N/A 07/12/2018   Procedure: Coronary/Graft Acute MI Revascularization;  Surgeon: Troy Sine, MD;  Location: [facility];  Service: Cardiovascular;  Laterality: N/A;   I & D EXTREMITY  04/11/2012   Procedure: IRRIGATION AND DEBRIDEMENT EXTREMITY;  Surgeon: Wylene Simmer, MD;  Location: [facility];  Service: Orthopedics;   Laterality: Right;   LEFT HEART CATH AND CORONARY ANGIOGRAPHY N/A 07/12/2018   Procedure: LEFT HEART CATH AND CORONARY ANGIOGRAPHY;  Surgeon: Troy Sine, MD;  Location: [facility];  Service: Cardiovascular;  Laterality: N/A;   Surgery left great toe     Tear ducts bilateral eyes     TRANSMETATARSAL AMPUTATION Right 07/08/2018       Home Medications    Prior to Admission medications   Medication Sig Start Date End Date Taking? Authorizing Provider  traMADol (ULTRAM) 50 MG tablet Take 1 tablet (50 mg total) by mouth 3 (three) times daily as needed. 12/11/20  Yes Rosemarie Ax, MD  albuterol (VENTOLIN HFA) 108 (90 Base) MCG/ACT inhaler Inhale 2 puffs into the lungs every 4 (four) hours as needed for wheezing or shortness of breath. 12/08/19   Shirley, [place], DO  aspirin 81 MG chewable tablet Chew 81 mg by mouth daily.    [provider]  baclofen (LIORESAL) 10 MG tablet Take 10 mg by mouth 2 (two) times daily.    [provider]  calcium carbonate (OSCAL) 1500 (600 Ca) MG TABS tablet Take 600 mg of elemental calcium by mouth daily.    [provider]  Cholecalciferol (VITAMIN D3) 50 MCG (2000 UT) TABS Take 2,000 Units by mouth daily.    [provider]  fluticasone (FLONASE) 50 MCG/ACT nasal spray Place 1 spray into both nostrils daily. 1 spray in each nostril every day 12/05/20   Doristine Mango L, DO  furosemide (LASIX) 20 MG tablet Take by mouth.    [provider]  insulin aspart (NOVOLOG) 100 UNIT/ML injection Inject 35 Units into the skin 3 (three) times daily before meals.    [provider]  insulin glargine (LANTUS) 100 UNIT/ML injection Inject 55 Units into the skin 2 (two) times daily.    [provider]  metoprolol tartrate (LOPRESSOR) 25 MG tablet Take 25 mg by mouth 2 (two) times daily.    [provider]  Multiple Vitamin (MULTIVITAMIN WITH MINERALS) TABS tablet Take 1 tablet by mouth daily.     [provider]  nitroGLYCERIN (NITROSTAT) 0.4 MG SL tablet Place 1 tablet (0.4 mg total) under the tongue every 5 (five) minutes x 3 doses as needed for chest pain. 07/15/18   Tommie Raymond, NP  rosuvastatin (CRESTOR) 40 MG tablet Take 1 tablet (40 mg total) by mouth daily at 6 PM. 05/15/20   Matilde Haymaker, MD  sildenafil (VIAGRA) 100 MG tablet Take 100 mg by mouth daily as needed for erectile dysfunction.    [provider]  tamsulosin (FLOMAX) 0.4 MG CAPS capsule Take 1 capsule (0.4 mg total) by mouth daily. 08/10/20   [name], MD  ticagrelor (BRILINTA) 90 MG TABS tablet Take 1 tablet (90 mg total) by mouth 2 (two) times daily. 10/07/18   Lorretta Harp, MD  levofloxacin (LEVAQUIN) 750 MG tablet Take 1 tablet (750 mg total)  by mouth daily. For 14 days 07/07/12 07/24/12  Verdie Drown, Samuel Germany, MD    Family History Family History  Problem Relation Age of Onset   Diabetes Mother    Stroke Mother    Heart failure Father    Heart attack Father    Diabetes Sister        multiple siblings   Diabetes Brother        muliple siblings   Heart disease Brother    Colon cancer Neg Hx    Esophageal cancer Neg Hx    Rectal cancer Neg Hx    Stomach cancer Neg Hx     Social History Social History   Tobacco Use   Smoking status: Former    Packs/day: 0.30    Years: 48.00    Pack years: 14.40    Types: Cigars, Cigarettes    Start date: 07/02/1963    Quit date: 06/01/2019    Years since quitting: 1.5   Smokeless tobacco: Former  Scientific laboratory technician Use: Former  Substance Use Topics   Alcohol use: Yes    Alcohol/week: 0.0 standard drinks   Drug use: No     Allergies   Codeine   Review of Systems Review of Systems See HPI  Physical Exam Triage Vital Signs ED Triage Vitals  Enc Vitals Group     BP 12/11/20 1909 (!) 146/73     Pulse Rate 12/11/20 1909 70     Resp 12/11/20 1909 (!) 21     Temp 12/11/20 1909 98.9 F (37.2 C)     Temp Source 12/11/20 1909  Oral     SpO2 12/11/20 1909 100 %     Weight --      Height --      Head Circumference --      Peak Flow --      Pain Score 12/11/20 1906 10     Pain Loc --      Pain Edu? --      Excl. in [place]? --    No data found.  Updated Vital Signs BP (!) 146/73 (BP Location: Right Arm)   Pulse 70   Temp 98.9 F (37.2 C) (Oral)   Resp (!) 21   SpO2 100%   Visual Acuity Right Eye Distance:   Left Eye Distance:   Bilateral Distance:    Right Eye Near:   Left Eye Near:    Bilateral Near:     Physical Exam Gen: NAD, alert, cooperative with exam, well-appearing Neuro: normal tone, normal sensation to touch Psych:  normal insight, alert and oriented MSK:  Right shoulder:  Limited active flexion and abduction. Limited external rotation. Weakness with empty can testing. Neurovascular intact   UC Treatments / Results  Labs (all labs ordered are listed, but only abnormal results are displayed) Labs Reviewed - No data to display  EKG   Radiology No results found.  Procedures Procedures (including critical care time)  Medications Ordered in UC Medications - No data to display  Initial Impression / Assessment and Plan / UC Course  I have reviewed the triage vital signs and the nursing notes.  Pertinent labs & imaging results that were available during my care of the patient were reviewed by me and considered in my medical decision making (see chart for details).     Mr. Longshore is a 65 year old male that is presenting with right shoulder pain.  Seems to have a capsulitis with given his lack of  range of motion but also has significant rotator cuff pathology.  His last A1c was around 11 so staying away from steroids.  Has significant cardiovascular disease so trying to abstain from NSAIDs.  Has allergy to codeine so we will provide tramadol.  Counseled on home exercise therapy and supportive care.  Counseled on follow-up.  Final Clinical Impressions(s) / UC Diagnoses   Final  diagnoses:  Rotator cuff arthropathy of right shoulder     Discharge Instructions      Please try a warm wash cloth  Please try bengay or capsaicin  Please try the tramadol for severe pain.  Please follow up if your pain continues.      ED Prescriptions     Medication Sig Dispense Auth. Provider   traMADol (ULTRAM) 50 MG tablet Take 1 tablet (50 mg total) by mouth 3 (three) times daily as needed. 15 tablet Rosemarie Ax, MD      I have reviewed the PDMP during this encounter.   Rosemarie Ax, MD 12/11/20 [phone]

## 2020-12-11 NOTE — ED Triage Notes (Signed)
Pain in right shoulder noticed 2 weeks ago.  Pain radiates into right side of neck and head .  No known injury

## 2020-12-14 DIAGNOSIS — M25512 Pain in left shoulder: Secondary | ICD-10-CM | POA: Insufficient documentation

## 2020-12-14 DIAGNOSIS — M542 Cervicalgia: Secondary | ICD-10-CM | POA: Diagnosis not present

## 2020-12-14 DIAGNOSIS — M47812 Spondylosis without myelopathy or radiculopathy, cervical region: Secondary | ICD-10-CM | POA: Insufficient documentation

## 2020-12-19 DIAGNOSIS — R6889 Other general symptoms and signs: Secondary | ICD-10-CM | POA: Diagnosis not present

## 2020-12-28 ENCOUNTER — Ambulatory Visit (INDEPENDENT_AMBULATORY_CARE_PROVIDER_SITE_OTHER): Payer: Medicare HMO | Admitting: Family Medicine

## 2020-12-28 ENCOUNTER — Ambulatory Visit (INDEPENDENT_AMBULATORY_CARE_PROVIDER_SITE_OTHER): Payer: Medicare HMO

## 2020-12-28 ENCOUNTER — Ambulatory Visit (INDEPENDENT_AMBULATORY_CARE_PROVIDER_SITE_OTHER): Payer: Medicare HMO | Admitting: Physician Assistant

## 2020-12-28 ENCOUNTER — Ambulatory Visit: Payer: Medicare HMO | Admitting: Orthopedic Surgery

## 2020-12-28 ENCOUNTER — Other Ambulatory Visit: Payer: Self-pay

## 2020-12-28 ENCOUNTER — Encounter: Payer: Self-pay | Admitting: Family Medicine

## 2020-12-28 ENCOUNTER — Ambulatory Visit: Payer: No Typology Code available for payment source | Admitting: Orthopedic Surgery

## 2020-12-28 ENCOUNTER — Encounter: Payer: Self-pay | Admitting: Orthopedic Surgery

## 2020-12-28 DIAGNOSIS — R32 Unspecified urinary incontinence: Secondary | ICD-10-CM | POA: Diagnosis not present

## 2020-12-28 DIAGNOSIS — Z794 Long term (current) use of insulin: Secondary | ICD-10-CM | POA: Diagnosis not present

## 2020-12-28 DIAGNOSIS — Z23 Encounter for immunization: Secondary | ICD-10-CM | POA: Diagnosis not present

## 2020-12-28 DIAGNOSIS — I87323 Chronic venous hypertension (idiopathic) with inflammation of bilateral lower extremity: Secondary | ICD-10-CM

## 2020-12-28 DIAGNOSIS — E1141 Type 2 diabetes mellitus with diabetic mononeuropathy: Secondary | ICD-10-CM

## 2020-12-28 MED ORDER — METFORMIN HCL 500 MG PO TABS: 500.0000 mg | ORAL_TABLET | Freq: Two times a day (BID) | ORAL | 1 refills | Status: DC

## 2020-12-28 MED ORDER — DOXYCYCLINE HYCLATE 100 MG PO TABS: 100.0000 mg | ORAL_TABLET | Freq: Two times a day (BID) | ORAL | 0 refills | Status: DC

## 2020-12-28 NOTE — Progress Notes (Signed)
Office Visit Note   Patient: Adam Ray           Date of Birth: [birth year]           MRN: [id number] Visit Date: 12/28/2020              Requested by: [name], [place],  [place] [address] PCP: [name], MD  Chief Complaint  Patient presents with   Right Leg - Follow-up    02/23/20 right BKA       HPI: Patient presents today 10 months status post right below-knee amputation this is not his concern today.  He has had continued left foot swelling on and off.  He has been wearing his vive compression stocking.  His wife is concerned because he has had some maceration between his toes that was oozing what she describes as brown fluid.  She wants to know if there is anything that can be done to straighten his toes.  Assessment & Plan: Visit Diagnoses: No diagnosis found.  Plan: We will place him on a short course of antibiotics told him they need to also get a probiotic.  Specifically they will be on doxycycline.  Follow-up in 2 weeks.  Reminded her to wash and dry thoroughly between his toes.  And have given her some spacers to allow airing out of these areas.  Continue to wear socks and elevate feet.  If he continues with this much swelling in his ankle may be appropriate to compression wrap.  Follow-up in 2 weeks advised him against any corrective surgery on his toes as he is high risk  Follow-Up Instructions: No follow-ups on file.   Ortho Exam  Patient is alert, oriented, no adenopathy, well-dressed, normal affect, normal respiratory effort. Examination left foot pulse is palpable.  No cellulitis no erythema no tenderness.  He has moderate soft tissue swelling without ascending cellulitis in the left foot.  No open ulcers.  Does have some maceration between the second and third toes no discoloration no warmth no signs of infection  Imaging: No results found. No images are attached to the encounter.  Labs: Lab Results   Component Value Date   HGBA1C 11.9 (A) 12/06/2020   HGBA1C 11.3 (A) 08/10/2020   HGBA1C 11.5 (A) 05/08/2020   ESRSEDRATE 54 (H) 01/01/2020   ESRSEDRATE 8 01/29/2016   ESRSEDRATE 9 04/11/2012   CRP 2.6 (H) 01/29/2016   CRP 0.5 02/28/2014   REPTSTATUS 01/18/2020 FINAL 01/17/2020   REPTSTATUS 01/22/2020 FINAL 01/17/2020   REPTSTATUS 01/22/2020 FINAL 01/17/2020   GRAMSTAIN  04/11/2012    NO WBC SEEN RARE SQUAMOUS EPITHELIAL CELLS PRESENT MODERATE GRAM POSITIVE COCCI IN PAIRS RARE GRAM NEGATIVE RODS   GRAMSTAIN  04/11/2012    NO WBC SEEN RARE SQUAMOUS EPITHELIAL CELLS PRESENT MODERATE GRAM POSITIVE COCCI IN PAIRS RARE GRAM NEGATIVE RODS   CULT (A) 01/17/2020    <10,000 COLONIES/mL INSIGNIFICANT GROWTH Performed at [facility], [address] [place], [place] [address]    CULT  01/17/2020    NO GROWTH 5 DAYS Performed at [facility] [address], [place], [place] [address]    CULT  01/17/2020    NO GROWTH 5 DAYS Performed at [facility] [address] [place],  [address]    LABORGA NO GROWTH 2 DAYS 07/19/2014     Lab Results  Component Value Date   ALBUMIN 4.1 12/06/2020   ALBUMIN 4.3 12/01/2020  ALBUMIN 3.7 02/23/2020    No results found for: MG No results found for: VD25OH  No results found for: PREALBUMIN CBC EXTENDED Latest Ref Rng & Units 12/06/2020 12/01/2020 02/23/2020  WBC 3.4 - 10.8 x10E3/uL 5.5 6.3 3.9(L)  RBC 4.14 - 5.80 x10E6/uL 4.90 4.66 3.97(L)  HGB 13.0 - 17.7 g/dL 13.7 13.1 11.6(L)  HCT 37.5 - 51.0 % 39.8 38.8(L) 35.9(L)  PLT 150 - 450 x10E3/uL 138(L) 104.0(L) 178  NEUTROABS 1.4 - 7.7 K/uL - 4.8 -  LYMPHSABS 0.7 - 4.0 K/uL - 0.9 -     There is no height or weight on file to calculate BMI.  Orders:  No orders of the defined types were placed in this encounter.  Meds ordered this encounter  Medications   doxycycline (VIBRA-TABS) 100 MG tablet    Sig: Take 1 tablet (100 mg total) by mouth 2 (two) times daily.     Dispense:  20 tablet    Refill:  0     Procedures: No procedures performed  Clinical Data: No additional findings.  ROS:  All other systems negative, except as noted in the HPI. Review of Systems  Objective: Vital Signs: There were no vitals taken for this visit.  Specialty Comments:  No specialty comments available.  PMFS History: Patient Active Problem List   Diagnosis Date Noted   Illness 12/06/2020   Urinary incontinence 08/10/2020   Blurred vision, bilateral 07/13/2020   Increased urinary frequency 05/09/2020   Dehiscence of amputation stump ([place])    Coronary artery disease 01/18/2020   Nocturnal leg cramps 10/28/2019   Hyperlipidemia 05/26/2019   Edema leg 12/29/2018   STEMI involving left circumflex coronary artery ([place]) 07/12/2018   Status post transmetatarsal amputation of foot, right ([place]) 07/08/2018   Limited mobility 05/12/2018   Gingival foreign body 07/22/2017   Anxiety state 06/02/2017   Leg swelling 01/29/2017   Idiopathic chronic venous hypertension of both lower extremities with inflammation 09/24/2016   Testicular mass 04/18/2016   Subacute osteomyelitis, right ankle and foot ([place]) 01/29/2016   Diabetic polyneuropathy associated with type 2 diabetes mellitus ([place])    Type 2 diabetes mellitus with neurologic complication, with long-term current use of insulin ([place]) 12/08/2015   Pulmonary nodule 02/01/2015   Depression 12/09/2013   Warts, genital 08/20/2013   Diabetic retinopathy ([place]) 01/28/2013   Sleep apnea 09/06/2010   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   Essential hypertension 11/09/2008   COPD, mild ([place]) 10/06/2006   SICKLE-CELL TRAIT 04/26/2006   ERECTILE DYSFUNCTION 04/26/2006   Tobacco abuse 04/26/2006   PAD (peripheral artery disease) ([place]) 04/26/2006   ALLERGIC RHINITIS 04/26/2006   Past Medical History:  Diagnosis Date   Allergy    Arthritis    Bronchitis    Cataract    CHF (congestive heart failure) ([place])    Chronic  kidney disease    Claudication ([place])    right foot ray resection   Colon polyps    hyperplastic   COPD (chronic obstructive pulmonary disease) ([place])    Coronary artery disease    Diabetes mellitus    type II   Genital warts    Gout    Hyperlipidemia    Hypertension    Myocardial infarction ([place])    Osteomyelitis of third toe of right foot ([place])    Pneumonia    Status post amputation of toe of right foot ([place]) 09/24/2016    Family History  Problem Relation Age of Onset   Diabetes Mother    Stroke  Mother    Heart failure Father    Heart attack Father    Diabetes Sister        multiple siblings   Diabetes Brother        muliple siblings   Heart disease Brother    Colon cancer Neg Hx    Esophageal cancer Neg Hx    Rectal cancer Neg Hx    Stomach cancer Neg Hx     Past Surgical History:  Procedure Laterality Date   AMPUTATION Right 01/31/2016   Procedure: Right 2nd Toe Amputation;  Surgeon: Newt Minion, MD;  Location: [facility];  Service: Orthopedics;  Laterality: Right;   AMPUTATION Right 07/08/2018   Procedure: RIGHT TRANSMETATARSAL AMPUTATION;  Surgeon: Newt Minion, MD;  Location: [facility];  Service: Orthopedics;  Laterality: Right;   AMPUTATION Right 01/05/2020   Procedure: RIGHT BELOW KNEE AMPUTATION;  Surgeon: Newt Minion, MD;  Location: [facility];  Service: Orthopedics;  Laterality: Right;   AMPUTATION Right 02/23/2020   Procedure: RIGHT BELOW KNEE AMPUTATION REVISION;  Surgeon: Newt Minion, MD;  Location: [facility];  Service: Orthopedics;  Laterality: Right;   CATARACT EXTRACTION     right eye   COLONOSCOPY     CORONARY STENT INTERVENTION N/A 07/12/2018   Procedure: CORONARY STENT INTERVENTION;  Surgeon: Troy Sine, MD;  Location: [facility];  Service: Cardiovascular;  Laterality: N/A;   CORONARY/GRAFT ACUTE MI REVASCULARIZATION N/A 07/12/2018   Procedure: Coronary/Graft Acute MI Revascularization;  Surgeon: Troy Sine, MD;  Location: [facility];   Service: Cardiovascular;  Laterality: N/A;   I & D EXTREMITY  04/11/2012   Procedure: IRRIGATION AND DEBRIDEMENT EXTREMITY;  Surgeon: Wylene Simmer, MD;  Location: [facility];  Service: Orthopedics;  Laterality: Right;   LEFT HEART CATH AND CORONARY ANGIOGRAPHY N/A 07/12/2018   Procedure: LEFT HEART CATH AND CORONARY ANGIOGRAPHY;  Surgeon: Troy Sine, MD;  Location: [facility];  Service: Cardiovascular;  Laterality: N/A;   Surgery left great toe     Tear ducts bilateral eyes     TRANSMETATARSAL AMPUTATION Right 07/08/2018   Social History   Occupational History   Occupation: disabled  Tobacco Use   Smoking status: Former    Packs/day: 0.30    Years: 48.00    Pack years: 14.40    Types: 82, Cigarettes    Start date: 07/02/1963    Quit date: 06/01/2019    Years since quitting: 1.5   Smokeless tobacco: Former  Scientific laboratory technician Use: Former  Substance and Sexual Activity   Alcohol use: Yes    Alcohol/week: 0.0 standard drinks   Drug use: No   Sexual activity: Not on file

## 2020-12-28 NOTE — Progress Notes (Signed)
  Date of Visit: 12/28/2020   SUBJECTIVE:   HPI:  Adam Ray presents today for follow up.  Diabetes - currently taking lantus 60u daily and novolog 40u three times daily with meals. A1c earlier this month was 11.9. no major low sugars lately.  Urinary incontinence - persists for many months now. Is taking flomax 0.4mg  daily. Has urology appointment on July 5. Flomax helped some but still having issues with it. No other new urinary symptoms.   Breast spot - has bump on L breast at 10oclock position, present for a couple months. Not painful.   OBJECTIVE:   BP 111/63   Pulse 67   Wt 163 lb 6.4 oz (74.1 kg)   SpO2 100%   BMI 23.45 kg/m  Gen: no acute distress, [place], cooperative HEENT: normocephalic, atraumatic  Lungs: normal work of breathing  Skin: area of small skin cyst on L breast at 10oclock position with overlying blackhead, consistent with likely sebaceous cyst. Neuro: alert, speech normal, grossly nonfocal  ASSESSMENT/PLAN:   Health maintenance:  -2nd COVID booster given today -given rx for shingrix vaccine to take to his pharmacy  Type 2 diabetes mellitus with neurologic complication, with long-term current use of insulin ([name]) Uncontrolled. Notably I was able to access some records from the [place] which show that his [name] PCP is asking the same question I've asked regarding who is managing his diabetes. Will proceed with adjusting insulin here. Will have him split lantus in to 30u twice daily for hopefully Ray absorption. Add back metformin 500mg  twice daily. Follow up with Adam Ray in pharmacy clinic for further titration of insulin.  Urinary incontinence Stable chronic problem. Fortunately has upcoming appointment with urology. Will not change medications at this time - he will keep that appointment next week with urology  Skin cyst of breast Consistent with sebaceous cyst - seems very superficial on skin with small overlying blackhead, not deeper within breast  tissue. As it is not causing him pain will observe for now.  FOLLOW UP: Follow up in 1 day for diabetes with Adam Ray. Adam Ray, [place]

## 2020-12-28 NOTE — Patient Instructions (Addendum)
COVID booster #2 today  Take shingles shot to your pharmacy to get it  Split your lantus dose into 30u twice daily.  Keep appointment next week with urologist.  Durene Cal in metformin 500mg  twice daily for you to take  See [name] tomorrow at 3pm to discuss diabetes. Bring all medications to that appointment.  Be well, Dr. [name]

## 2020-12-29 ENCOUNTER — Ambulatory Visit: Payer: Medicare HMO | Admitting: Pharmacist

## 2020-12-31 NOTE — Assessment & Plan Note (Signed)
Stable chronic problem. Fortunately has upcoming appointment with urology. Will not change medications at this time - he will keep that appointment next week with urology

## 2020-12-31 NOTE — Assessment & Plan Note (Signed)
Uncontrolled. Notably I was able to access some records from the [place] which show that his [facility] PCP is asking the same question I've asked regarding who is managing his diabetes. Will proceed with adjusting insulin here. Will have him split lantus in to 30u twice daily for hopefully better absorption. Add back metformin 500mg  twice daily. Follow up with [name] in pharmacy clinic for further titration of insulin.

## 2021-01-04 ENCOUNTER — Ambulatory Visit (INDEPENDENT_AMBULATORY_CARE_PROVIDER_SITE_OTHER): Payer: Medicare HMO | Admitting: Pharmacist

## 2021-01-04 ENCOUNTER — Other Ambulatory Visit: Payer: Self-pay

## 2021-01-04 DIAGNOSIS — E1141 Type 2 diabetes mellitus with diabetic mononeuropathy: Secondary | ICD-10-CM

## 2021-01-04 DIAGNOSIS — Z794 Long term (current) use of insulin: Secondary | ICD-10-CM | POA: Diagnosis not present

## 2021-01-04 MED ORDER — TRULICITY 0.75 MG/0.5ML ~~LOC~~ SOAJ: 0.7500 mg | SUBCUTANEOUS | 0 refills | Status: DC

## 2021-01-04 NOTE — Assessment & Plan Note (Signed)
T2DM is not controlled likely due to some confusion with medications and patient reporting he has been eating candy when questioned regarding elevated blood glucose readings in the 200's. Additional pharmacotherapy is warranted. Patient previously on Trulicity in 0037 and discontinued when patient was having confusion regarding regimen. Discussed with patient and his wife today and they were able to verbalize understanding of treatment plan. Will initiate Trulicity 0.75mg  once weekly. Patient educated on purpose, proper use and potential adverse effects of Trulicity. In addition discussed with patient that he should only take Novolog TID with his three daily meals and to no longer take an extra 40 units at night in an attempt to bring down his glucose as this is likely the cause of his hypoglycemia.   1. Continued basal insulin glargine (Lantus) 30 units BID 2. Continued  rapid insulin aspart (Novolog) 40 units TID with meals. 3. Started GLP-1 dulaglutide (Trulicity) 0.75mg  once weekly.  4. Extensively discussed pathophysiology of diabetes, dietary effects on blood sugar control, and recommended lifestyle interventions. 5. Patient will adhere to dietary modifications of avoiding candy. 6. Counseled on s/sx of and management of hypoglycemia 7. Next A1C anticipated September 2022.

## 2021-01-04 NOTE — Patient Instructions (Signed)
Mr. Delfin it was a pleasure seeing you today.   Please do the following:  Start Trulicity 0.75mg  ONCE A WEEK ON MONDAYS as directed today during your appointment. If you have any questions or if you believe something has occurred because of this change, call me or your doctor to let one of [place] know.  Take your NOVOLOG ONLY THREE TIMES A DAY WITH YOUR THREE MEALS Continue checking blood sugars at home. It's really important that you record these and bring these in to your next doctor's appointment.  Continue making the lifestyle changes we've discussed together during our visit. Diet and exercise play a significant role in improving your blood sugars.  Follow-up with me in three weeks.    Hypoglycemia or low blood sugar:   Low blood sugar can happen quickly and may become an emergency if not treated right away.   While this shouldn't happen often, it can be brought upon if you skip a meal or do not eat enough. Also, if your insulin or other diabetes medications are dosed too high, this can cause your blood sugar to go to low.   Warning signs of low blood sugar include: Feeling shaky or dizzy Feeling weak or tired  Excessive hunger Feeling anxious or upset  Sweating even when you aren't exercising  What to do if I experience low blood sugar? Follow the Rule of 15 Check your blood sugar with your meter. If lower than 70, proceed to step 2.  Treat with 15 grams of fast acting carbs which is found in 3-4 glucose tablets. If none are available you can try hard candy, 1 tablespoon of sugar or honey,4 ounces of fruit juice, or 6 ounces of REGULAR soda.  Re-check your sugar in 15 minutes. If it is still below 70, do what you did in step 2 again. If your blood sugar has come back up, go ahead and eat a snack or small meal made up of complex carbs (ex. Whole grains) and protein at this time to avoid recurrence of low blood sugar.

## 2021-01-04 NOTE — Progress Notes (Signed)
Subjective:    Patient ID: Adam Ray, male    DOB: [birth year], 65 y.o.   MRN: [id number]  HPI Patient is a 65 y.o. male who presents for diabetes management. He is in good spirits and presents without assistance. Patient was referred and last seen by Primary Care Provider on 12/28/20.  Patient states he would like to know how to get his A1C and blood glucose down. He states he did pick up the new prescription Dr. [name] prescribed (metformin) and has had no issues with it but reports his blood glucose is still sometimes high.  Insurance coverage/medication affordability: Medicare/VA  Family/Social history: lives with his wife  Current diabetes medications include: insulin aspart (Novolog) 40 units TID (patient reports taking 4x/day), insulin glargine (Lantus) 30 units BID, metformin 1 tablet BID Current hypertension medications include: metoprolol tartrate 25mg , furosemide 20mg  Current hyperlipidemia medications include: rosuvastatin 40mg  Patient states that He is taking his medications as prescribed. Patient reports adherence with medications.   Do you feel that your medications are working for you?  yes  Have you been experiencing any side effects to the medications prescribed? no  Do you have any problems obtaining medications due to transportation or finances?  no     Patient reported dietary habits:  Eats 3 meals/day and 1-2 snacks/day; Boluses with 3 meals/day  Breakfast:cauliflower +broccoli  Lunch:cauliflower + broccoli Dinner:cauliflower, broccoli, hotdog  Snacks: fruit; pickles; cucumbers Drinks: water, diet soda, coffee (creamer),    Patient reports hypoglycemic events. He had one low of 49 that he states occurred because he took his insulin and did not eat anything. Patient denies polyuria (increased urination).  Patient denies polyphagia (increased appetite).  Patient reports polydipsia (increased thirst).  Patient denies neuropathy (nerve pain). Patient reports  visual changes. Patient reports self foot exams.   7 day avg: 168 Home fasting blood sugars: 141, 196, 160, 110, 79, 49, 134, 107 2 hour post-meal/random blood sugars: 153, 162, 280, 183, 244, 226, 256, 170  Objective:   Labs:   Physical Exam Neurological:     Mental Status: He is alert and oriented to person, place, and time.   Lab Results  Component Value Date   POCGLU 217 (A) 12/05/2020   POCGLU 67 (A) 05/15/2020   POCGLU 409 (A) 05/12/2020     Lab Results  Component Value Date   MICRALBCREAT 3.8 12/08/2015    Lipid Panel     Component Value Date/Time   CHOL 97 (L) 12/06/2020 1642   TRIG 143 12/06/2020 1642   HDL 39 (L) 12/06/2020 1642   CHOLHDL 2.5 12/06/2020 1642   CHOLHDL 2.4 10/18/2018 1612   VLDL 44 (H) 10/18/2018 1612   LDLCALC 33 12/06/2020 1642   LDLDIRECT 78 09/11/2011 1503    Clinical Atherosclerotic Cardiovascular Disease (ASCVD): Yes  The ASCVD Risk score Mikey Bussing DC Jr., et al., 2013) failed to calculate for the following reasons:   The patient has a prior MI or stroke diagnosis   Assessment/Plan:   T2DM is not controlled likely due to some confusion with medications and patient reporting he has been eating candy when questioned regarding elevated blood glucose readings in the 200's. Additional pharmacotherapy is warranted. Patient previously on Trulicity in 3710 and discontinued when patient was having confusion regarding regimen. Discussed with patient and his wife today and they were able to verbalize understanding of treatment plan. Will initiate Trulicity 0.75mg  once weekly. Patient educated on purpose, proper use and potential adverse effects of Trulicity. In addition  discussed with patient that he should only take Novolog TID with his three daily meals and to no longer take an extra 40 units at night in an attempt to bring down his glucose as this is likely the cause of his hypoglycemia.   Continued basal insulin glargine (Lantus) 30 units  BID Continued  rapid insulin aspart (Novolog) 40 units TID with meals. Started GLP-1 dulaglutide (Trulicity) 0.75mg  once weekly.  Extensively discussed pathophysiology of diabetes, dietary effects on blood sugar control, and recommended lifestyle interventions. Patient will adhere to dietary modifications of avoiding candy. Counseled on s/sx of and management of hypoglycemia Next A1C anticipated September 2022.   ASCVD risk - secondary prevention in patient with diabetes. Last LDL is controlled.  Continued aspirin 81 mg  Continued rosuvastatin 40 mg.   Follow-up appointment two to three weeks to review sugar readings. Written patient instructions provided.  This appointment required 45 minutes of patient care (this includes precharting, chart review, review of results, and face-to-face care).  Thank you for involving pharmacy to assist in providing this patient's care.

## 2021-01-10 ENCOUNTER — Ambulatory Visit: Payer: Medicare HMO | Admitting: Family

## 2021-01-16 ENCOUNTER — Telehealth: Payer: Self-pay | Admitting: Podiatry

## 2021-01-16 ENCOUNTER — Encounter: Payer: Self-pay | Admitting: Podiatry

## 2021-01-16 NOTE — Telephone Encounter (Signed)
Called patient lvm to reschedule 8/24 appt and sent reschedule letter  

## 2021-01-17 ENCOUNTER — Other Ambulatory Visit: Payer: Self-pay

## 2021-01-17 ENCOUNTER — Encounter: Payer: Self-pay | Admitting: Family

## 2021-01-17 ENCOUNTER — Ambulatory Visit (INDEPENDENT_AMBULATORY_CARE_PROVIDER_SITE_OTHER): Payer: Medicare HMO | Admitting: Family

## 2021-01-17 DIAGNOSIS — I739 Peripheral vascular disease, unspecified: Secondary | ICD-10-CM

## 2021-01-17 DIAGNOSIS — B353 Tinea pedis: Secondary | ICD-10-CM | POA: Diagnosis not present

## 2021-01-17 DIAGNOSIS — I87323 Chronic venous hypertension (idiopathic) with inflammation of bilateral lower extremity: Secondary | ICD-10-CM | POA: Diagnosis not present

## 2021-01-17 MED ORDER — CLOTRIMAZOLE 1 % EX CREA: 1.0000 "application " | TOPICAL_CREAM | Freq: Two times a day (BID) | CUTANEOUS | 0 refills | Status: DC

## 2021-01-17 NOTE — Progress Notes (Signed)
Office Visit Note   Patient: Adam Ray           Date of Birth: 08-30-1955           MRN: [id number] Visit Date: 01/17/2021              Requested by: [name], [place] [place],  [place] [address] PCP: [name], MD  No chief complaint on file.     HPI: The patient is a 65 year old gentleman seen today for left lower extremity evaluation.  His wife is concerned for a foul odor between his toes.  He also has some pain on his posterior heel  Wearing medical compression stockings he states he wears these daily.  He feels he has had worse edema despite the compression.  He does not sleep in a recliner states he sleeps lying flat the edema does not seem to resolve in the morning.  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  He has had some issues getting his socket on its painful to get difficulty due to volume change it increased volume to his residual limb  Assessment & Plan: Visit Diagnoses: No diagnosis found.  Plan: Tinea pedis on the left.  Have recommended topical treatment for this.  They will place cotton between the toes.  Continue with compression garments, knee-high.  Discussed elevation for swelling.  Discussed possibility of speaking with his heart failure provider about his increased lower extremity edema.  Follow-Up Instructions: Return in about 6 weeks (around 02/28/2021).   Ortho Exam  Patient is alert, oriented, no adenopathy, well-dressed, normal affect, normal respiratory effort. Semination of left lower extremity he does have 2+ pitting edema up to the tibial tubercle.  There are some dry flaking skin.  To his posterior heel there is some callused skin and a healed ulcer posteriorly ulcer appeared to have been about 2 cm in diameter this is not open there is no erythema no drainage no redness completely epithelialized.  There is some maceration in the webspaces  between all toes on the left foot there is no ulceration no purulence no erythema  Imaging: No results found. No images are attached to the encounter.  Labs: Lab Results  Component Value Date   HGBA1C 11.9 (A) 12/06/2020   HGBA1C 11.3 (A) 08/10/2020   HGBA1C 11.5 (A) 05/08/2020   ESRSEDRATE 54 (H) 01/01/2020   ESRSEDRATE 8 01/29/2016   ESRSEDRATE 9 04/11/2012   CRP 2.6 (H) 01/29/2016   CRP 0.5 02/28/2014   REPTSTATUS 01/18/2020 FINAL 01/17/2020   REPTSTATUS 01/22/2020 FINAL 01/17/2020   REPTSTATUS 01/22/2020 FINAL 01/17/2020   GRAMSTAIN  04/11/2012    NO WBC SEEN RARE SQUAMOUS EPITHELIAL CELLS PRESENT MODERATE GRAM POSITIVE COCCI IN PAIRS RARE GRAM NEGATIVE RODS   GRAMSTAIN  04/11/2012    NO WBC SEEN RARE SQUAMOUS EPITHELIAL CELLS PRESENT MODERATE GRAM POSITIVE COCCI IN PAIRS RARE GRAM NEGATIVE RODS   CULT (A) 01/17/2020    <10,000 COLONIES/mL INSIGNIFICANT GROWTH Performed at [facility], [address] [place], [place] [address]    CULT  01/17/2020    NO GROWTH 5 DAYS Performed at [facility] [address], [place], [place] [address]    CULT  01/17/2020    NO GROWTH 5 DAYS Performed at [facility] [address], [place], [place] [address]    LABORGA NO GROWTH  2 DAYS 07/19/2014     Lab Results  Component Value Date   ALBUMIN 4.1 12/06/2020   ALBUMIN 4.3 12/01/2020   ALBUMIN 3.7 02/23/2020    No results found for: MG No results found for: VD25OH  No results found for: PREALBUMIN CBC EXTENDED Latest Ref Rng & Units 12/06/2020 12/01/2020 02/23/2020  WBC 3.4 - 10.8 x10E3/uL 5.5 6.3 3.9(L)  RBC 4.14 - 5.80 x10E6/uL 4.90 4.66 3.97(L)  HGB 13.0 - 17.7 g/dL 13.7 13.1 11.6(L)  HCT 37.5 - 51.0 % 39.8 38.8(L) 35.9(L)  PLT 150 - 450 x10E3/uL 138(L) 104.0(L) 178  NEUTROABS 1.4 - 7.7 K/uL - 4.8 -  LYMPHSABS 0.7 - 4.0 K/uL - 0.9 -     There is no height or weight on file to calculate BMI.  Orders:  No orders of the defined types were  placed in this encounter.  Meds ordered this encounter  Medications   clotrimazole (LOTRIMIN AF) 1 % cream    Sig: Apply 1 application topically 2 (two) times daily.    Dispense:  30 g    Refill:  0     Procedures: No procedures performed  Clinical Data: No additional findings.  ROS:  All other systems negative, except as noted in the HPI. Review of Systems  Constitutional:  Negative for chills and fever.  Cardiovascular:  Positive for leg swelling.  Skin:  Negative for color change and wound.   Objective: Vital Signs: There were no vitals taken for this visit.  Specialty Comments:  No specialty comments available.  PMFS History: Patient Active Problem List   Diagnosis Date Noted   Illness 12/06/2020   Urinary incontinence 08/10/2020   Blurred vision, bilateral 07/13/2020   Increased urinary frequency 05/09/2020   Dehiscence of amputation stump ([place])    Coronary artery disease 01/18/2020   Nocturnal leg cramps 10/28/2019   Hyperlipidemia 05/26/2019   Edema leg 12/29/2018   STEMI involving left circumflex coronary artery ([place]) 07/12/2018   Status post transmetatarsal amputation of foot, right ([place]) 07/08/2018   Limited mobility 05/12/2018   Gingival foreign body 07/22/2017   Anxiety state 06/02/2017   Leg swelling 01/29/2017   Idiopathic chronic venous hypertension of both lower extremities with inflammation 09/24/2016   Testicular mass 04/18/2016   Subacute osteomyelitis, right ankle and foot ([place]) 01/29/2016   Diabetic polyneuropathy associated with type 2 diabetes mellitus ([place])    Type 2 diabetes mellitus with neurologic complication, with long-term current use of insulin ([place]) 12/08/2015   Pulmonary nodule 02/01/2015   Depression 12/09/2013   Warts, genital 08/20/2013   Diabetic retinopathy ([place]) 01/28/2013   Sleep apnea 09/06/2010   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   Essential hypertension 11/09/2008   COPD, mild ([place]) 10/06/2006   SICKLE-CELL TRAIT  04/26/2006   ERECTILE DYSFUNCTION 04/26/2006   Tobacco abuse 04/26/2006   PAD (peripheral artery disease) ([facility] [place]) 04/26/2006   ALLERGIC RHINITIS 04/26/2006   Past Medical History:  Diagnosis Date   Allergy    Arthritis    Bronchitis    Cataract    CHF (congestive heart failure) ([facility])    Chronic kidney disease    Claudication ([place])    right foot ray resection   Colon polyps    hyperplastic   COPD (chronic obstructive pulmonary disease) ([facility])    Coronary artery disease    Diabetes mellitus    type II   Genital warts    Gout    Hyperlipidemia    Hypertension    Myocardial infarction ([place])  Osteomyelitis of third toe of right foot ([facility])    Pneumonia    Status post amputation of toe of right foot ([place]) 09/24/2016    Family History  Problem Relation Age of Onset   Diabetes Mother    Stroke Mother    Heart failure Father    Heart attack Father    Diabetes Sister        multiple siblings   Diabetes Brother        muliple siblings   Heart disease Brother    Colon cancer Neg Hx    Esophageal cancer Neg Hx    Rectal cancer Neg Hx    Stomach cancer Neg Hx     Past Surgical History:  Procedure Laterality Date   AMPUTATION Right 01/31/2016   Procedure: Right 2nd Toe Amputation;  Surgeon: Newt Minion, MD;  Location: [facility];  Service: Orthopedics;  Laterality: Right;   AMPUTATION Right 07/08/2018   Procedure: RIGHT TRANSMETATARSAL AMPUTATION;  Surgeon: Newt Minion, MD;  Location: [facility];  Service: Orthopedics;  Laterality: Right;   AMPUTATION Right 01/05/2020   Procedure: RIGHT BELOW KNEE AMPUTATION;  Surgeon: Newt Minion, MD;  Location: [facility];  Service: Orthopedics;  Laterality: Right;   AMPUTATION Right 02/23/2020   Procedure: RIGHT BELOW KNEE AMPUTATION REVISION;  Surgeon: Newt Minion, MD;  Location: [facility];  Service: Orthopedics;  Laterality: Right;   CATARACT EXTRACTION     right eye   COLONOSCOPY     CORONARY STENT INTERVENTION N/A 07/12/2018   Procedure: CORONARY  STENT INTERVENTION;  Surgeon: Troy Sine, MD;  Location: [facility];  Service: Cardiovascular;  Laterality: N/A;   CORONARY/GRAFT ACUTE MI REVASCULARIZATION N/A 07/12/2018   Procedure: Coronary/Graft Acute MI Revascularization;  Surgeon: Troy Sine, MD;  Location: [facility];  Service: Cardiovascular;  Laterality: N/A;   I & D EXTREMITY  04/11/2012   Procedure: IRRIGATION AND DEBRIDEMENT EXTREMITY;  Surgeon: Wylene Simmer, MD;  Location: [facility];  Service: Orthopedics;  Laterality: Right;   LEFT HEART CATH AND CORONARY ANGIOGRAPHY N/A 07/12/2018   Procedure: LEFT HEART CATH AND CORONARY ANGIOGRAPHY;  Surgeon: Troy Sine, MD;  Location: [facility];  Service: Cardiovascular;  Laterality: N/A;   Surgery left great toe     Tear ducts bilateral eyes     TRANSMETATARSAL AMPUTATION Right 07/08/2018   Social History   Occupational History   Occupation: disabled  Tobacco Use   Smoking status: Former    Packs/day: 0.30    Years: 48.00    Pack years: 14.40    Types: 29, Cigarettes    Start date: 07/02/1963    Quit date: 06/01/2019    Years since quitting: 1.6   Smokeless tobacco: Former  Scientific laboratory technician Use: Former  Substance and Sexual Activity   Alcohol use: Yes    Alcohol/week: 0.0 standard drinks   Drug use: No   Sexual activity: Not on file

## 2021-01-19 ENCOUNTER — Other Ambulatory Visit: Payer: Self-pay | Admitting: Family Medicine

## 2021-01-22 NOTE — Telephone Encounter (Signed)
Red team, can you contact patient and confirm that: 1) he is still taking this medication 2) did he see urology?  Thanks, [name], MD

## 2021-01-23 NOTE — Telephone Encounter (Signed)
Called patient and LVM for him to call [facility] if he is taking medication.  Ozella Almond, [place]

## 2021-01-25 ENCOUNTER — Other Ambulatory Visit: Payer: Self-pay

## 2021-01-25 ENCOUNTER — Ambulatory Visit (INDEPENDENT_AMBULATORY_CARE_PROVIDER_SITE_OTHER): Payer: Medicare HMO | Admitting: Pharmacist

## 2021-01-25 DIAGNOSIS — Z794 Long term (current) use of insulin: Secondary | ICD-10-CM

## 2021-01-25 DIAGNOSIS — E1141 Type 2 diabetes mellitus with diabetic mononeuropathy: Secondary | ICD-10-CM | POA: Diagnosis not present

## 2021-01-25 NOTE — Progress Notes (Signed)
Subjective:    Patient ID: Adam Ray, male    DOB: 03/07/1956, 65 y.o.   MRN: [id number]  HPI Patient is a 65 y.o. male who presents for diabetes management. He is in good spirits and presents without assistance. Patient was referred and last seen by Primary Care Provider on 12/28/20.  Patient states he would like to know how to get his A1C and blood glucose down. He states he did pick up the new prescription Dr. [name] prescribed (metformin) and has had no issues with it but reports his blood glucose is still sometimes high.  Patient has completed 3 doses of Trulicity 0.'75mg'$  once weekly. Reports no side effects  Insurance coverage/medication affordability: Medicare/VA  Family/Social history: lives with his wife  Current diabetes medications include: insulin aspart (Novolog) 40 units TID (patient reports taking 4x/day), insulin glargine (Lantus) 30 units BID, metformin 1 tablet BID, Trulicity 0.'75mg'$  once weekly Current hypertension medications include: metoprolol tartrate '25mg'$  BID, furosemide '20mg'$  Current hyperlipidemia medications include: rosuvastatin '40mg'$  Patient states that He is taking his medications as prescribed. Patient reports adherence with medications.   Do you feel that your medications are working for you?  yes  Have you been experiencing any side effects to the medications prescribed? no  Do you have any problems obtaining medications due to transportation or finances?  no     Patient reported dietary habits:  Eats 3 meals/day and 1-2 snacks/day; Boluses with 3 meals/day  Breakfast:cauliflower +broccoli  Lunch:cauliflower + broccoli Dinner:cauliflower, broccoli, hotdog  Snacks: fruit; pickles; cucumbers Drinks: water, diet soda, coffee (creamer),    Patient reports hypoglycemic events. He had one low of 49 that he states occurred because he took his insulin and did not eat anything. Patient denies polyuria (increased urination).  Patient denies polyphagia  (increased appetite).  Patient reports polydipsia (increased thirst).  Patient denies neuropathy (nerve pain). Patient reports visual changes. Patient reports self foot exams.   7 day avg: 115 Home fasting blood sugars: 113, 80, 125, 123, 151, 99, 82, 59, 34 2 hour post-meal/random blood sugars: 135, 120, 146, 75, 46, 185, 126  Objective:   Labs:   Physical Exam Neurological:     Mental Status: He is alert and oriented to person, place, and time.   Lab Results  Component Value Date   POCGLU 217 (A) 12/05/2020   POCGLU 67 (A) 05/15/2020   POCGLU 409 (A) 05/12/2020     Lab Results  Component Value Date   MICRALBCREAT 3.8 12/08/2015    Lipid Panel     Component Value Date/Time   CHOL 97 (L) 12/06/2020 1642   TRIG 143 12/06/2020 1642   HDL 39 (L) 12/06/2020 1642   CHOLHDL 2.5 12/06/2020 1642   CHOLHDL 2.4 10/18/2018 1612   VLDL 44 (H) 10/18/2018 1612   LDLCALC 33 12/06/2020 1642   LDLDIRECT 78 09/11/2011 1503    Clinical Atherosclerotic Cardiovascular Disease (ASCVD): Yes  The ASCVD Risk score Mikey Bussing DC Jr., et al., 2013) failed to calculate for the following reasons:   The patient has a prior MI or stroke diagnosis   Assessment/Plan:   T2DM is not controlled likely due to patient reporting inaccurate information regarding diet and medication administration. Discussed with patient importance of administering insulin correctly and providing accurate history. Due to frequent hypoglycemic events will decrease Lantus from 30 units BID to 50 units once daily in the morning. Will also decrease Novolog to 30 units and only take with meals a maximum of 3 times  a day. Patient verbalized understanding of treatment plan and repeated back correctly. Decreased basal insulin glargine (Lantus) 50 units once daily Decreased rapid insulin aspart (Novolog) 30 units with meals max of TID Continued GLP-1 dulaglutide (Trulicity) 0.'75mg'$  once weekly.  Extensively discussed pathophysiology of  diabetes, dietary effects on blood sugar control, and recommended lifestyle interventions. Counseled on s/sx of and management of hypoglycemia Next A1C anticipated September 2022.   ASCVD risk - secondary prevention in patient with diabetes. Last LDL is controlled.  Continued aspirin 81 mg  Continued rosuvastatin 40 mg.   Follow-up appointment two to three weeks to review sugar readings. Written patient instructions provided.  This appointment required 45 minutes of patient care (this includes precharting, chart review, review of results, and face-to-face care).  Thank you for involving pharmacy to assist in providing this patient's care.

## 2021-01-25 NOTE — Patient Instructions (Signed)
Mr. Kain it was a pleasure seeing you today.    Please do the following:   Decrease Novolog to 30 units and ONLY TAKE IF YOU ARE GOING TO EAT A MEAL. TAKE A MAX OF 3 TIMES A DAY meaning if you eat 4 meals still only take it 3 times. Decrease Lantus to 50 units and take only one time a day in the morning. You will no longer take Lantus in the evening. If you have any questions or if you believe something has occurred because of this change, call me or your doctor to let one of us know.  Continue checking blood sugars at home. It's really important that you record these and bring these in to your next doctor's appointment.  Continue making the lifestyle changes we've discussed together during our visit. Diet and exercise play a significant role in improving your blood sugars.  Follow-up with me in two weeks.      The sensor is small waterproof disc that is placed on the back of the upper arm.  There is a very thin filament that is inserted under the surface of the skin and measures the amount of glucose in the interstitial fluid.  This system collects your sugar levels for up to 14 days and it automatically records the glucose level every 15 minutes. This will show your provider any patterns in your glucose levels.   Please remember... 1. Sensor will last 14 days 2. Sensor should be applied to area away from scarring, tattoos, irritation, and bones. 3. Starting the sensor: 1 hour warm up before BG readings available   4. Scan the sensor at least every 8 hours 5. Hold reader within 1.5 inches of sensor to scan 6. When the blood drop and magnifying glass symbol appears, test fingerstick blood glucose prior to making treatment decisions 7. Do a fingerstick blood glucose test if the sensor readings do not match how you feel 8. Remove sensor prior to magnetic resonance imaging (MRI), computed tomography (CT) scan, or high-frequency electrical heat (diathermy) treatment. 9. Freestyle Libre may be  worn through a walk-through metal detector. It may not be exposed to an advanced Imaging Technology (AIT) body scanner (also called a millimeter wave scanner) or the baggage x-ray machine. Instead, ask for hand-wanding or full-body pat-down and visual inspection.  10. Doses of vitamin C (ascorbic acid) >500 mg every day may cause false high readings. 11. Do not submerge more than 3 feet or keep underwater longer than 30 minutes at a time. Gently pat to dry.  12. Store sensor kit between 39 and 77 degrees Farenheit. Can be refrigerated within this temperature range.   Problems with Freestyle Libre sticking? 1. Order Tegaderm I.V. films to place directly over Freestyle Libre sensor on arm. 2. May also order Skin Tac from [employer]. Alcohol swab area you plan to administer Freestyle Libre then let dry. Once dry, apply Skin Tac in a circular motion (with a spot in the middle for sensor without skin tac) and let dry. Once dry you can apply Freestyle Libre    Problems taking off Freestyle Libre? 1. Remember to try to shower before removing Freestyle Libre 2. Order Tac Away to help remove any extra adhesive left on your skin once you remove Freestyle Libre 3. May also try baby oil to loosen adhesive   Freestyle Libre Customer Care Phone number: [phone] Available 7 days a week; excluding holidays 8 AM to 8PM EST  [email or web address]   Hypoglycemia or low blood   sugar:    Low blood sugar can happen quickly and may become an emergency if not treated right away.    While this shouldn't happen often, it can be brought upon if you skip a meal or do not eat enough. Also, if your insulin or other diabetes medications are dosed too high, this can cause your blood sugar to go to low.    Warning signs of low blood sugar include: Feeling shaky or dizzy Feeling weak or tired  Excessive hunger Feeling anxious or upset  Sweating even when you aren't exercising   What to do if I experience low blood  sugar? Follow the Rule of 15 Check your blood sugar. If lower than 70, proceed to step 2.  Treat with 15 grams of fast acting carbs which is found in 3-4 glucose tablets. If none are available you can try hard candy, 1 tablespoon of sugar or honey,4 ounces of fruit juice, or 6 ounces of REGULAR soda.  Re-check your sugar in 15 minutes. If it is still below 70, do what you did in step 2 again. If your blood sugar has come back up, go ahead and eat a snack or small meal made up of complex carbs (ex. Whole grains) and protein at this time to avoid recurrence of low blood sugar.  

## 2021-01-29 DIAGNOSIS — R52 Pain, unspecified: Secondary | ICD-10-CM | POA: Diagnosis not present

## 2021-01-29 DIAGNOSIS — M7522 Bicipital tendinitis, left shoulder: Secondary | ICD-10-CM | POA: Diagnosis not present

## 2021-01-29 DIAGNOSIS — M5136 Other intervertebral disc degeneration, lumbar region: Secondary | ICD-10-CM | POA: Diagnosis not present

## 2021-01-29 NOTE — Assessment & Plan Note (Addendum)
T2DM is not controlled likely due to patient reporting inaccurate information regarding diet and medication administration. Discussed with patient importance of administering insulin correctly and providing accurate history. Due to frequent hypoglycemic events will decrease Lantus from 30 units BID to 50 units once daily in the morning. Will also decrease Novolog to 30 units and only take with meals a maximum of 3 times a day. Patient verbalized understanding of treatment plan and repeated back correctly. 1. Decreased basal insulin glargine (Lantus) 50 units once daily 2. Decreased rapid insulin aspart (Novolog) 30 units with meals max of TID 3. Continued GLP-1 dulaglutide (Trulicity) 0.'75mg'$  once weekly.  4. Extensively discussed pathophysiology of diabetes, dietary effects on blood sugar control, and recommended lifestyle interventions. 5. Counseled on s/sx of and management of hypoglycemia 6. Next A1C anticipated September 2022.

## 2021-01-30 ENCOUNTER — Other Ambulatory Visit: Payer: Self-pay | Admitting: Family Medicine

## 2021-02-08 ENCOUNTER — Other Ambulatory Visit: Payer: Self-pay

## 2021-02-08 ENCOUNTER — Ambulatory Visit (INDEPENDENT_AMBULATORY_CARE_PROVIDER_SITE_OTHER): Payer: Medicare HMO | Admitting: Pharmacist

## 2021-02-08 DIAGNOSIS — E1141 Type 2 diabetes mellitus with diabetic mononeuropathy: Secondary | ICD-10-CM

## 2021-02-08 DIAGNOSIS — Z794 Long term (current) use of insulin: Secondary | ICD-10-CM

## 2021-02-08 NOTE — Patient Instructions (Signed)
Mr. Noy it was a pleasure seeing you today.    Please do the following:   Decrease Novolog to 30 units and ONLY TAKE IF YOU ARE GOING TO EAT A MEAL. TAKE A MAX OF 3 TIMES A DAY meaning if you eat 4 meals still only take it 3 times. Decrease Lantus to 50 units and take only one time a day in the morning. You will no longer take Lantus in the evening. If you have any questions or if you believe something has occurred because of this change, call me or your doctor to let one of [place] know.  Continue checking blood sugars at home. It's really important that you record these and bring these in to your next doctor's appointment.  Continue making the lifestyle changes we've discussed together during our visit. Diet and exercise play a significant role in improving your blood sugars.  Follow-up with me in two weeks.      The sensor is small waterproof disc that is placed on the back of the upper arm.  There is a very thin filament that is inserted under the surface of the skin and measures the amount of glucose in the interstitial fluid.  This system collects your sugar levels for up to 14 days and it automatically records the glucose level every 15 minutes. This will show your provider any patterns in your glucose levels.   Please remember... 1. Sensor will last 14 days 2. Sensor should be applied to area away from scarring, tattoos, irritation, and bones. 3. Starting the sensor: 1 hour warm up before BG readings available   4. Scan the sensor at least every 8 hours 5. Hold reader within 1.5 inches of sensor to scan 6. When the blood drop and magnifying glass symbol appears, test fingerstick blood glucose prior to making treatment decisions 7. Do a fingerstick blood glucose test if the sensor readings do not match how you feel 8. Remove sensor prior to magnetic resonance imaging (MRI), computed tomography (CT) scan, or high-frequency electrical heat (diathermy) treatment. 9. Freestyle Libre may be  worn through a Environmental education officer. It may not be exposed to an advanced Imaging Technology (AIT) body scanner (also called a millimeter wave scanner) or the baggage x-ray machine. Instead, ask for hand-wanding or full-body pat-down and visual inspection.  10. Doses of vitamin C (ascorbic acid) >500 mg every day may cause false high readings. 11. Do not submerge more than 3 feet or keep underwater longer than 30 minutes at a time. Gently pat to dry.  12. Store sensor kit between 39 and 77 degrees Farenheit. Can be refrigerated within this temperature range.   Problems with Freestyle Libre sticking? 1. Order Tegaderm I.V. films to place directly over [facility] sensor on arm. 2. May also order Skin Tac from [facility]. Alcohol swab area you plan to administer Freestyle Libre then let dry. Once dry, apply Skin Tac in a circular motion (with a spot in the middle for sensor without skin tac) and let dry. Once dry you can apply Freestyle Libre    Problems taking off Freestyle [place]? 1. Remember to try to shower before removing Freestyle Libre 2. Order Tac Away to help remove any extra adhesive left on your skin once you remove Freestyle Libre 3. May also try baby oil to loosen adhesive   [place] Phone number: [phone] Available 7 days a week; excluding holidays 8 AM to 8PM EST  [email or web address]   Hypoglycemia or low blood  sugar:    Low blood sugar can happen quickly and may become an emergency if not treated right away.    While this shouldn't happen often, it can be brought upon if you skip a meal or do not eat enough. Also, if your insulin or other diabetes medications are dosed too high, this can cause your blood sugar to go to low.    Warning signs of low blood sugar include: Feeling shaky or dizzy Feeling weak or tired  Excessive hunger Feeling anxious or upset  Sweating even when you aren't exercising   What to do if I experience low blood  sugar? Follow the Rule of 15 Check your blood sugar. If lower than 70, proceed to step 2.  Treat with 15 grams of fast acting carbs which is found in 3-4 glucose tablets. If none are available you can try hard candy, 1 tablespoon of sugar or honey,4 ounces of fruit juice, or 6 ounces of REGULAR soda.  Re-check your sugar in 15 minutes. If it is still below 70, do what you did in step 2 again. If your blood sugar has come back up, go ahead and eat a snack or small meal made up of complex carbs (ex. Whole grains) and protein at this time to avoid recurrence of low blood sugar.

## 2021-02-08 NOTE — Progress Notes (Signed)
Subjective:    Patient ID: [name], male    DOB: 09/10/1955, 65 y.o.   MRN: [id number]  HPI Patient is a 65 y.o. male who presents for diabetes management. He is in good spirits and presents without assistance. Patient was referred and last seen by Primary Care Provider on 12/28/20. Last seen in pharmacy clinic on 01/25/21.  Insurance coverage/medication affordability: Medicare/[place]  Family/Social history: lives with his wife  Current diabetes medications include: insulin aspart (Novolog) 40 units TID, insulin glargine (Lantus) 30 units BID, metformin 1 tablet BID, Trulicity 0.'75mg'$  once weekly Current hypertension medications include: metoprolol tartrate '25mg'$  BID, furosemide '20mg'$  Current hyperlipidemia medications include: rosuvastatin '40mg'$  Patient states that He is taking his medications as prescribed. Patient reports adherence with medications.   Discussed with patient changes that were made to insulin therapy at last visit to which patient states he did make those changes and has been injecting Novolog 30 units TID and Lantus 50 units once daily which contradicts earlier report. Upon clarifying with patient it seems he is in fact still injecting his insulin as previously prescribed and did not make changes discussed at last appointment.  Do you feel that your medications are working for you?  yes  Have you been experiencing any side effects to the medications prescribed? no  Do you have any problems obtaining medications due to transportation or finances?  no     Patient reported dietary habits:  Eats 3 meals/day and 1-2 snacks/day; Boluses with 3 meals/day  Breakfast:cauliflower +broccoli  Lunch:cauliflower + broccoli Dinner:cauliflower, broccoli, hotdog  Snacks: fruit; pickles; cucumbers Drinks: water, diet soda, coffee (creamer),    Patient denies hypoglycemic events.  Patient denies polyuria (increased urination).  Patient denies polyphagia (increased appetite).  Patient  reports polydipsia (increased thirst).  Patient denies neuropathy (nerve pain). Patient reports visual changes. Patient reports self foot exams.   Patient did not present with glucometer but states his blood glucose this morning was 158   Objective:   Labs:   Physical Exam Neurological:     Mental Status: He is alert and oriented to person, place, and time.   Lab Results  Component Value Date   POCGLU 217 (A) 12/05/2020   POCGLU 67 (A) 05/15/2020   POCGLU 409 (A) 05/12/2020     Lab Results  Component Value Date   MICRALBCREAT 3.8 12/08/2015    Lipid Panel     Component Value Date/Time   CHOL 97 (L) 12/06/2020 1642   TRIG 143 12/06/2020 1642   HDL 39 (L) 12/06/2020 1642   CHOLHDL 2.5 12/06/2020 1642   CHOLHDL 2.4 10/18/2018 1612   VLDL 44 (H) 10/18/2018 1612   LDLCALC 33 12/06/2020 1642   LDLDIRECT 78 09/11/2011 1503    Clinical Atherosclerotic Cardiovascular Disease (ASCVD): Yes  The ASCVD Risk score Mikey Bussing DC Jr., et al., 2013) failed to calculate for the following reasons:   The patient has a prior MI or stroke diagnosis   Assessment/Plan:   T2DM is not controlled likely due to patient reporting inaccurate information regarding diet and medication administration. Discussed with patient importance of administering insulin correctly and providing accurate history. Reminded patient of new regimen. Patient verbalized understanding of treatment plan and repeated back correctly. Decreased basal insulin glargine (Lantus) 50 units once daily Decreased rapid insulin aspart (Novolog) 30 units with meals max of TID Continued GLP-1 dulaglutide (Trulicity) 0.'75mg'$  once weekly.  Extensively discussed pathophysiology of diabetes, dietary effects on blood sugar control, and recommended lifestyle interventions. Counseled  on s/sx of and management of hypoglycemia Next A1C anticipated September 2022.   ASCVD risk - secondary prevention in patient with diabetes. Last LDL is  controlled.  Continued aspirin 81 mg  Continued rosuvastatin 40 mg.   Follow-up appointment two to three weeks to review sugar readings. Written patient instructions provided.  This appointment required 45 minutes of direct patient care.  Thank you for involving pharmacy to assist in providing this patient's care.  Patient seen with Joseph Art, PharmD - PGY-1 Resident.

## 2021-02-12 NOTE — Assessment & Plan Note (Signed)
T2DM is not controlled likely due to patient reporting inaccurate information regarding diet and medication administration. Discussed with patient importance of administering insulin correctly and providing accurate history. Reminded patient of new regimen. Patient verbalized understanding of treatment plan and repeated back correctly. 1. Decreased basal insulin glargine (Lantus) 50 units once daily 2. Decreased rapid insulin aspart (Novolog) 30 units with meals max of TID 3. Continued GLP-1 dulaglutide (Trulicity) 0.'75mg'$  once weekly.  4. Extensively discussed pathophysiology of diabetes, dietary effects on blood sugar control, and recommended lifestyle interventions. 5. Counseled on s/sx of and management of hypoglycemia 6. Next A1C anticipated September 2022.

## 2021-02-13 ENCOUNTER — Ambulatory Visit (INDEPENDENT_AMBULATORY_CARE_PROVIDER_SITE_OTHER): Payer: Medicare HMO | Admitting: Pharmacist

## 2021-02-13 ENCOUNTER — Other Ambulatory Visit: Payer: Self-pay

## 2021-02-13 DIAGNOSIS — E1141 Type 2 diabetes mellitus with diabetic mononeuropathy: Secondary | ICD-10-CM | POA: Diagnosis not present

## 2021-02-13 DIAGNOSIS — Z794 Long term (current) use of insulin: Secondary | ICD-10-CM

## 2021-02-13 NOTE — Patient Instructions (Signed)
Adam Ray it was a pleasure seeing you today.   The sensor is small waterproof disc that is placed on the back of the upper arm.  There is a very thin filament that is inserted under the surface of the skin and measures the amount of glucose in the interstitial fluid.  This system collects your sugar levels for up to 14 days and it automatically records the glucose level every 15 minutes. This will show your provider any patterns in your glucose levels.  Please remember... 1. Sensor will last 14 days 2. Sensor should be applied to area away from scarring, tattoos, irritation, and bones. 3. Starting the sensor: 1 hour warm up before BG readings available   4. Scan the sensor at least every 8 hours 5. Hold reader within 1.5 inches of sensor to scan 6. When the blood drop and magnifying glass symbol appears, test fingerstick blood glucose prior to making treatment decisions 7. Do a fingerstick blood glucose test if the sensor readings do not match how you feel 8. Remove sensor prior to magnetic resonance imaging (MRI), computed tomography (CT) scan, or high-frequency electrical heat (diathermy) treatment. 9. Freestyle Libre may be worn through a Environmental education officer. It may not be exposed to an advanced Imaging Technology (AIT) body scanner (also called a millimeter wave scanner) or the baggage x-ray machine. Instead, ask for hand-wanding or full-body pat-down and visual inspection.  10. Doses of vitamin C (ascorbic acid) >500 mg every day may cause false high readings. 11. Do not submerge more than 3 feet or keep underwater longer than 30 minutes at a time. Gently pat to dry.  12. Store sensor kit between 39 and 77 degrees Farenheit. Can be refrigerated within this temperature range.  Problems with Freestyle Libre sticking? 1. Order Tegaderm I.V. films to place directly over [facility] sensor on arm. 2. May also order Skin Tac from [facility]. Alcohol swab area you plan to administer  Freestyle Libre then let dry. Once dry, apply Skin Tac in a circular motion (with a spot in the middle for sensor without skin tac) and let dry. Once dry you can apply Freestyle Libre   Problems taking off Freestyle [place]? 1. Remember to try to shower before removing Freestyle Libre 2. Order Tac Away to help remove any extra adhesive left on your skin once you remove Freestyle Libre 3. May also try baby oil to loosen adhesive  [place] Phone number: [phone] Available 7 days a week; excluding holidays 8 AM to 8PM EST  [email or web address]  Please do the following:  Continue your medications as directed today during your appointment. If you have any questions or if you believe something has occurred because of this change, call me or your doctor to let one of [place] know.  Continue checking blood sugars at home. It's really important that you record these and bring these in to your next doctor's appointment.  Continue making the lifestyle changes we've discussed together during our visit. Diet and exercise play a significant role in improving your blood sugars.  Follow-up with me and Dr. [name] on 8/30 at 10:00 AM.   Hypoglycemia or low blood sugar:   Low blood sugar can happen quickly and may become an emergency if not treated right away.   While this shouldn't happen often, it can be brought upon if you skip a meal or do not eat enough. Also, if your insulin or other diabetes medications are dosed too high, this can cause  your blood sugar to go to low.   Warning signs of low blood sugar include: Feeling shaky or dizzy Feeling weak or tired  Excessive hunger Feeling anxious or upset  Sweating even when you aren't exercising  What to do if I experience low blood sugar? Follow the Rule of 15 Check your blood sugar. If lower than 70, proceed to step 2.  Treat with 15 grams of fast acting carbs which is found in 3-4 glucose tablets. If none are available you can  try hard candy, 1 tablespoon of sugar or honey,4 ounces of fruit juice, or 6 ounces of REGULAR soda.  Re-check your sugar in 15 minutes. If it is still below 70, do what you did in step 2 again. If your blood sugar has come back up, go ahead and eat a snack or small meal made up of complex carbs (ex. Whole grains) and protein at this time to avoid recurrence of low blood sugar.

## 2021-02-14 NOTE — Progress Notes (Signed)
Subjective:    Patient ID: Adam Ray, male    DOB: 12-Jul-1955, 65 y.o.   MRN: [id number]  HPI Patient is a 65 y.o. male who presents for diabetes management as he was having issues with his [place] sensor. He is in good spirits and presents without assistance with his wife. Patient was referred and last seen by Primary Care Provider on 12/28/20. Last seen in pharmacy clinic on 02/08/21.  Insurance coverage/medication affordability: Medicare/VA  Family/Social history: lives with his wife  Current diabetes medications include: insulin aspart (Novolog) 30 units TID, insulin glargine (Lantus) 50 units once daily, metformin 500mg  1 tablet BID, Trulicity 0.75mg  once weekly Current hypertension medications include: metoprolol tartrate 25mg  BID, furosemide 20mg  Current hyperlipidemia medications include: rosuvastatin 40mg  Patient states that He is taking his medications as prescribed. Patient reports adherence with medications.   Do you feel that your medications are working for you?  yes  Have you been experiencing any side effects to the medications prescribed? no  Do you have any problems obtaining medications due to transportation or finances?  no     Patient reported dietary habits:  Eats 3 meals/day and 1-2 snacks/day; [place] with 3 meals/day  Breakfast:cauliflower +broccoli  Lunch:cauliflower + broccoli Dinner:cauliflower, broccoli, hotdog  Snacks: fruit; pickles; cucumbers Drinks: water, diet soda, coffee (creamer),    Patient denies hypoglycemic events.  Patient denies polyuria (increased urination).  Patient denies polyphagia (increased appetite).  Patient reports polydipsia (increased thirst).  Patient denies neuropathy (nerve pain). Patient reports visual changes. Patient reports self foot exams.   Freestyle Libre 2.0 patient education Patient taking >500 mg Vitamin C: denies Reminded patient to not take Vitamin C with Freestyle Libre.  Sensor application -- sensor  placed on right arm 1. Site selection and site prep with alcohol pad/Skin Tac 2. Sensor prep-sensor pack and sensor applicator 3. Starting the sensor: 1 hour warm up before BG readings available     Will ask for fingersticks the first 12 hours   4. Sensor change every 14 days and rotate site 5. Call [employer] customer service if sensor comes off before 14 days  Safety and Troubleshooting 1. Scan the sensor at least every 8 hours 2. When the "test BG" symbol appears, test fingerstick blood glucose prior to    making treatment decisions 3. Do a fingerstick blood glucose test if the sensor readings do not match how    you feel 4. Remove sensor prior for MRI or CT. Sensor may be damaged by exposure to    airport x-ray screening 5. Vitamin C may cause false high readings and aspirin may cause false low     readings 6. Store sensor kit between 39 and 77 degrees. Can be refrigerated at this temp.  Contact information provided for [employer] customer service and/or trainer.  Objective:   CGM report from last 7 days   Average Glucose 155   Time in Range (TIR) %  Target 69   Labs:   Physical Exam Neurological:     Mental Status: He is alert and oriented to person, place, and time.   Lab Results  Component Value Date   POCGLU 217 (A) 12/05/2020   POCGLU 67 (A) 05/15/2020   POCGLU 409 (A) 05/12/2020     Lab Results  Component Value Date   MICRALBCREAT 3.8 12/08/2015    Lipid Panel     Component Value Date/Time   CHOL 97 (L) 12/06/2020 1642   TRIG 143 12/06/2020 1642   HDL  39 (L) 12/06/2020 1642   CHOLHDL 2.5 12/06/2020 1642   CHOLHDL 2.4 10/18/2018 1612   VLDL 44 (H) 10/18/2018 1612   LDLCALC 33 12/06/2020 1642   LDLDIRECT 78 09/11/2011 1503    Clinical Atherosclerotic Cardiovascular Disease (ASCVD): Yes  The ASCVD Risk score Mikey Bussing DC Jr., et al., 2013) failed to calculate for the following reasons:   The patient has a prior MI or stroke diagnosis   Assessment/Plan:    T2DM control is improving likely due to patient verbalizing properly taking medications at this time. Praised patient for correct administration. Patient verbalized understanding of treatment plan and repeated back correctly. Continued basal insulin glargine (Lantus) 50 units once daily Continued rapid insulin aspart (Novolog) 30 units with meals max of TID Continued GLP-1 dulaglutide (Trulicity) 0.75mg  once weekly.  Continued metformin 500mg  BID Extensively discussed pathophysiology of diabetes, dietary effects on blood sugar control, and recommended lifestyle interventions. Counseled on s/sx of and management of hypoglycemia Next A1C anticipated September 2022.   ASCVD risk - secondary prevention in patient with diabetes. Last LDL is controlled.  Continued aspirin 81 mg  Continued rosuvastatin 40 mg.   Follow-up appointment in two weeks with pharmacy clinic and PCP. Written patient instructions provided  This appointment required 25 minutes of direct patient care.  Thank you for involving pharmacy to assist in providing this patient's care.  Patient seen with Meyer Russel, PharmD Candidate, and Joseph Art, PharmD - PGY-1 Resident.

## 2021-02-14 NOTE — Assessment & Plan Note (Signed)
T2DM control is improving likely due to patient verbalizing properly taking medications at this time. Praised patient for correct administration. Patient verbalized understanding of treatment plan and repeated back correctly. 1. Continued basal insulin glargine (Lantus) 50 units once daily 2. Continued rapid insulin aspart (Novolog) 30 units with meals max of TID 3. Continued GLP-1 dulaglutide (Trulicity) 0.'75mg'$  once weekly.  4. Continued metformin '500mg'$  BID 5. Extensively discussed pathophysiology of diabetes, dietary effects on blood sugar control, and recommended lifestyle interventions. 6. Counseled on s/sx of and management of hypoglycemia 7. Next A1C anticipated September 2022.

## 2021-02-16 ENCOUNTER — Ambulatory Visit: Payer: Medicare HMO | Admitting: Pharmacist

## 2021-02-21 ENCOUNTER — Ambulatory Visit: Payer: No Typology Code available for payment source | Admitting: Podiatry

## 2021-02-23 ENCOUNTER — Ambulatory Visit: Payer: Medicare HMO | Admitting: Pharmacist

## 2021-02-27 ENCOUNTER — Other Ambulatory Visit: Payer: Self-pay

## 2021-02-27 ENCOUNTER — Encounter: Payer: Self-pay | Admitting: Family Medicine

## 2021-02-27 ENCOUNTER — Ambulatory Visit: Payer: Medicare HMO | Admitting: Pharmacist

## 2021-02-27 ENCOUNTER — Ambulatory Visit (INDEPENDENT_AMBULATORY_CARE_PROVIDER_SITE_OTHER): Payer: Medicare HMO | Admitting: Family Medicine

## 2021-02-27 VITALS — BP 120/73 | HR 56 | Wt 166.0 lb

## 2021-02-27 DIAGNOSIS — E1141 Type 2 diabetes mellitus with diabetic mononeuropathy: Secondary | ICD-10-CM

## 2021-02-27 DIAGNOSIS — Z794 Long term (current) use of insulin: Secondary | ICD-10-CM

## 2021-02-27 LAB — POCT GLYCOSYLATED HEMOGLOBIN (HGB A1C): HbA1c, POC (controlled diabetic range): 7 % (ref 0.0–7.0)

## 2021-02-27 MED ORDER — SHINGRIX 50 MCG/0.5ML IM SUSR: 0.5000 mL | Freq: Once | INTRAMUSCULAR | 1 refills | Status: AC

## 2021-02-27 NOTE — Progress Notes (Signed)
  Date of Visit: 02/27/2021   SUBJECTIVE:   HPI:  Adam Ray presents today for follow up.  Diabetes - currently taking lantus 30u twice daily and novolog 30u three times daily with meals. Overall is eating healthier, putting in effort to make better food choices. Fasting sugars tend to run 190s-200s but A1c today is 7.0. Denies having any low sugars. He tells me today he wants me to call and speak with his [place] doctor; that the [place] doctor (Dr. Cherlynn Kaiser) is waiting to hear from me. I have not heard anything from this doctor prior to now.  OBJECTIVE:   BP 120/73   Pulse (!) 56   Wt 166 lb (75.3 kg)   SpO2 100%   BMI 23.82 kg/m  Gen: no acute distress, pleasant, cooperative HEENT: normocephalic, atraumatic  Heart: regular rate and rhythm, no murmur Lungs: clear to auscultation bilaterally, normal work of breathing  Neuro: alert, speech normal, grossly nonfocal  ASSESSMENT/PLAN:   Health maintenance:  -gave printed rx for shingrix vaccine for patient to get at his pharmacy  Type 2 diabetes mellitus with neurologic complication, with long-term current use of insulin (HCC) A1c greatly improved today to 7.0. Given improvement in A1c and lack of low sugars, will not make any adjustments to current insulin regimen. Provided our clinic fax number and advised if his [place] doctor wants to speak with me, they are welcome to call the clinic and I'll be happy to talk with him. Discussed that it is generally better to see ONE office for chronic disease management, rather than multiple different offices who do not have interwoven medical records - makes care fragmented and very challenging to know what the other clinic is doing regarding his diabetes management. Follow up with me in 3 months for next A1c, sooner if needed.  FOLLOW UP: Follow up in 3 mos for A1c  [name], [place]

## 2021-02-27 NOTE — Patient Instructions (Signed)
It was great to see you again today!  Stay on current diabetes medications  Our fax number is [phone]. If your [place] doctor wants to call I am happy to speak with her.  Take shingles shot prescription to your pharmacy  Follow up in 3 months with me  Be well, Dr. [name]

## 2021-02-28 ENCOUNTER — Ambulatory Visit: Payer: Medicare HMO | Admitting: Pharmacist

## 2021-02-28 ENCOUNTER — Ambulatory Visit: Payer: No Typology Code available for payment source | Admitting: Podiatry

## 2021-02-28 ENCOUNTER — Ambulatory Visit: Payer: Medicare HMO | Admitting: Family

## 2021-03-01 ENCOUNTER — Ambulatory Visit: Payer: Medicare HMO

## 2021-03-02 ENCOUNTER — Ambulatory Visit: Payer: Medicare HMO | Admitting: Pharmacist

## 2021-03-06 ENCOUNTER — Encounter: Payer: Self-pay | Admitting: Podiatry

## 2021-03-06 ENCOUNTER — Other Ambulatory Visit: Payer: Self-pay

## 2021-03-06 ENCOUNTER — Ambulatory Visit (INDEPENDENT_AMBULATORY_CARE_PROVIDER_SITE_OTHER): Payer: Medicare HMO | Admitting: Podiatry

## 2021-03-06 DIAGNOSIS — M79675 Pain in left toe(s): Secondary | ICD-10-CM

## 2021-03-06 DIAGNOSIS — B351 Tinea unguium: Secondary | ICD-10-CM

## 2021-03-06 DIAGNOSIS — E1151 Type 2 diabetes mellitus with diabetic peripheral angiopathy without gangrene: Secondary | ICD-10-CM

## 2021-03-06 DIAGNOSIS — I739 Peripheral vascular disease, unspecified: Secondary | ICD-10-CM

## 2021-03-06 DIAGNOSIS — S88119A Complete traumatic amputation at level between knee and ankle, unspecified lower leg, initial encounter: Secondary | ICD-10-CM

## 2021-03-06 NOTE — Assessment & Plan Note (Signed)
A1c greatly improved today to 7.0. Given improvement in A1c and lack of low sugars, will not make any adjustments to current insulin regimen. Provided our clinic fax number and advised if his [place] doctor wants to speak with me, they are welcome to call the clinic and I'll be happy to talk with him. Discussed that it is generally better to see ONE office for chronic disease management, rather than multiple different offices who do not have interwoven medical records - makes care fragmented and very challenging to know what the other clinic is doing regarding his diabetes management. Follow up with me in 3 months for next A1c, sooner if needed.

## 2021-03-06 NOTE — Progress Notes (Signed)
This patient returns to my office for at risk foot care.  This patient requires this care by a professional since this patient will be at risk due to having diabetic neuropathy and PAD.    This patient is unable to cut nails himself since the patient cannot reach his nails.These nails are painful walking and wearing shoes. He presents to the office with his wife. This patient presents for at risk foot care today.  General Appearance  Alert, conversant and in no acute stress.  Vascular  Dorsalis pedis and posterior tibial  pulses are  not palpable  Left..  Capillary return is within normal limits . Cold feet  left. bilaterally.  Neurologic  Senn-Weinstein monofilament wire test diminished  left. Muscle power within normal limits left.  Nails Thick disfigured discolored nails with subungual debris  from hallux to fifth toes left. No evidence of bacterial infection or drainage left.  Orthopedic  No limitations of motion  feet .  No crepitus or effusions noted.  No bony pathology or digital deformities noted. BK amputation right.  Skin  normotropic skin with no porokeratosis noted .  No signs of infections or ulcers noted.     Onychomycosis   Pain in left toes  Consent was obtained for treatment procedures.   Mechanical debridement of nails 1-5  left performed with a nail nipper.  Filed with dremel without incident.    Return office visit   3 months                  Told patient to return for periodic foot care and evaluation due to potential at risk complications.   Gardiner Barefoot DPM

## 2021-03-12 ENCOUNTER — Encounter: Payer: Self-pay | Admitting: Orthopedic Surgery

## 2021-03-12 ENCOUNTER — Ambulatory Visit (INDEPENDENT_AMBULATORY_CARE_PROVIDER_SITE_OTHER): Payer: Medicare HMO | Admitting: Physician Assistant

## 2021-03-12 VITALS — Ht 70.0 in | Wt 166.0 lb

## 2021-03-12 DIAGNOSIS — Z89511 Acquired absence of right leg below knee: Secondary | ICD-10-CM | POA: Diagnosis not present

## 2021-03-12 NOTE — Progress Notes (Signed)
Office Visit Note   Patient: Adam Ray           Date of Birth: 13-Jun-1956           MRN: [id number] Visit Date: 03/12/2021              Requested by: [name], MD [address] [place],  [place] [address] PCP: [name], MD  Chief Complaint  Patient presents with   Left Foot - Follow-up   Right Leg - Pain    Prosthesis problem      HPI: Patient is a pleasant 65 year old gentleman who is in follow-up for his left foot.  He is wearing his compression sock and reports he is doing much better without complaints.  He is recently received his right below-knee prosthetic 3 days ago.  He feels it is too tight not padded enough.  He is wearing extra socks to pad it more which is adding to the problem  Assessment & Plan: Visit Diagnoses: No diagnosis found.  Plan: Patient will return to [facility] for readjustment of his prosthetic and padding.  I do not think wearing extra socks is helping.  With regards to his left leg continue compression sock  Follow-Up Instructions: No follow-ups on file.   Ortho Exam  Patient is alert, oriented, no adenopathy, well-dressed, normal affect, normal respiratory effort. Examination of the right amputation stump well-healed surgical incision no cellulitis no swelling he has excellent knee range of motion no compromise in the skin notes evidence of infection.  On the left foot he has some moderate soft tissue swelling but no open ulcers.  Compartments are soft and compressible no ascending cellulitis or signs of infection  Imaging: No results found. No images are attached to the encounter.  Labs: Lab Results  Component Value Date   HGBA1C 7.0 02/27/2021   HGBA1C 11.9 (A) 12/06/2020   HGBA1C 11.3 (A) 08/10/2020   ESRSEDRATE 54 (H) 01/01/2020   ESRSEDRATE 8 01/29/2016   ESRSEDRATE 9 04/11/2012   CRP 2.6 (H) 01/29/2016   CRP 0.5 02/28/2014   REPTSTATUS 01/18/2020 FINAL 01/17/2020   REPTSTATUS 01/22/2020 FINAL  01/17/2020   REPTSTATUS 01/22/2020 FINAL 01/17/2020   GRAMSTAIN  04/11/2012    NO WBC SEEN RARE SQUAMOUS EPITHELIAL CELLS PRESENT MODERATE GRAM POSITIVE COCCI IN PAIRS RARE GRAM NEGATIVE RODS   GRAMSTAIN  04/11/2012    NO WBC SEEN RARE SQUAMOUS EPITHELIAL CELLS PRESENT MODERATE GRAM POSITIVE COCCI IN PAIRS RARE GRAM NEGATIVE RODS   CULT (A) 01/17/2020    <10,000 COLONIES/mL INSIGNIFICANT GROWTH Performed at [facility], [address], [place], [place] [address]    CULT  01/17/2020    NO GROWTH 5 DAYS Performed at [facility] [address] [place], [place] [address]    CULT  01/17/2020    NO GROWTH 5 DAYS Performed at [facility] [address], [place], [place] [address]    LABORGA NO GROWTH 2 DAYS 07/19/2014     Lab Results  Component Value Date   ALBUMIN 4.1 12/06/2020   ALBUMIN 4.3 12/01/2020   ALBUMIN 3.7 02/23/2020    No results found for: MG No results found for: VD25OH  No results found for: PREALBUMIN CBC EXTENDED Latest Ref Rng & Units 12/06/2020 12/01/2020 02/23/2020  WBC 3.4 - 10.8 x10E3/uL 5.5 6.3 3.9(L)  RBC 4.14 - 5.80 x10E6/uL 4.90 4.66 3.97(L)  HGB 13.0 - 17.7 g/dL 13.7 13.1 11.6(L)  HCT 37.5 -  51.0 % 39.8 38.8(L) 35.9(L)  PLT 150 - 450 x10E3/uL 138(L) 104.0(L) 178  NEUTROABS 1.4 - 7.7 K/uL - 4.8 -  LYMPHSABS 0.7 - 4.0 K/uL - 0.9 -     Body mass index is 23.82 kg/m.  Orders:  No orders of the defined types were placed in this encounter.  No orders of the defined types were placed in this encounter.    Procedures: No procedures performed  Clinical Data: No additional findings.  ROS:  All other systems negative, except as noted in the HPI. Review of Systems  Objective: Vital Signs: Ht '5\' 10"'$  (1.778 m)   Wt 166 lb (75.3 kg)   BMI 23.82 kg/m   Specialty Comments:  No specialty comments available.  PMFS History: Patient Active Problem List   Diagnosis Date Noted   Illness 12/06/2020   Urinary  incontinence 08/10/2020   Blurred vision, bilateral 07/13/2020   Increased urinary frequency 05/09/2020   Dehiscence of amputation stump ([place])    Coronary artery disease 01/18/2020   Nocturnal leg cramps 10/28/2019   Hyperlipidemia 05/26/2019   Edema leg 12/29/2018   STEMI involving left circumflex coronary artery ([place]) 07/12/2018   Status post transmetatarsal amputation of foot, right ([place]) 07/08/2018   Limited mobility 05/12/2018   Gingival foreign body 07/22/2017   Anxiety state 06/02/2017   Leg swelling 01/29/2017   Idiopathic chronic venous hypertension of both lower extremities with inflammation 09/24/2016   Testicular mass 04/18/2016   Subacute osteomyelitis, right ankle and foot ([place]) 01/29/2016   Diabetic polyneuropathy associated with type 2 diabetes mellitus ([place])    Type 2 diabetes mellitus with neurologic complication, with long-term current use of insulin ([place]) 12/08/2015   Pulmonary nodule 02/01/2015   Depression 12/09/2013   Warts, genital 08/20/2013   Diabetic retinopathy ([place]) 01/28/2013   Sleep apnea 09/06/2010   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   Essential hypertension 11/09/2008   COPD, mild ([place]) 10/06/2006   SICKLE-CELL TRAIT 04/26/2006   ERECTILE DYSFUNCTION 04/26/2006   Tobacco abuse 04/26/2006   PAD (peripheral artery disease) ([place]) 04/26/2006   ALLERGIC RHINITIS 04/26/2006   Past Medical History:  Diagnosis Date   Allergy    Arthritis    Bronchitis    Cataract    CHF (congestive heart failure) ([place])    Chronic kidney disease    Claudication ([place])    right foot ray resection   Colon polyps    hyperplastic   COPD (chronic obstructive pulmonary disease) ([place])    Coronary artery disease    Diabetes mellitus    type II   Genital warts    Gout    Hyperlipidemia    Hypertension    Myocardial infarction ([place])    Osteomyelitis of third toe of right foot ([place])    Pneumonia    Status post amputation of toe of right foot ([place]) 09/24/2016     Family History  Problem Relation Age of Onset   Diabetes Mother    Stroke Mother    Heart failure Father    Heart attack Father    Diabetes Sister        multiple siblings   Diabetes Brother        muliple siblings   Heart disease Brother    Colon cancer Neg Hx    Esophageal cancer Neg Hx    Rectal cancer Neg Hx    Stomach cancer Neg Hx     Past Surgical History:  Procedure Laterality Date   AMPUTATION Right 01/31/2016  Procedure: Right 2nd Toe Amputation;  Surgeon: Newt Minion, MD;  Location: [facility];  Service: Orthopedics;  Laterality: Right;   AMPUTATION Right 07/08/2018   Procedure: RIGHT TRANSMETATARSAL AMPUTATION;  Surgeon: Newt Minion, MD;  Location: [facility];  Service: Orthopedics;  Laterality: Right;   AMPUTATION Right 01/05/2020   Procedure: RIGHT BELOW KNEE AMPUTATION;  Surgeon: Newt Minion, MD;  Location: [facility];  Service: Orthopedics;  Laterality: Right;   AMPUTATION Right 02/23/2020   Procedure: RIGHT BELOW KNEE AMPUTATION REVISION;  Surgeon: Newt Minion, MD;  Location: [facility];  Service: Orthopedics;  Laterality: Right;   CATARACT EXTRACTION     right eye   COLONOSCOPY     CORONARY STENT INTERVENTION N/A 07/12/2018   Procedure: CORONARY STENT INTERVENTION;  Surgeon: Troy Sine, MD;  Location: [facility];  Service: Cardiovascular;  Laterality: N/A;   CORONARY/GRAFT ACUTE MI REVASCULARIZATION N/A 07/12/2018   Procedure: Coronary/Graft Acute MI Revascularization;  Surgeon: Troy Sine, MD;  Location: [facility];  Service: Cardiovascular;  Laterality: N/A;   I & D EXTREMITY  04/11/2012   Procedure: IRRIGATION AND DEBRIDEMENT EXTREMITY;  Surgeon: Wylene Simmer, MD;  Location: [facility];  Service: Orthopedics;  Laterality: Right;   LEFT HEART CATH AND CORONARY ANGIOGRAPHY N/A 07/12/2018   Procedure: LEFT HEART CATH AND CORONARY ANGIOGRAPHY;  Surgeon: Troy Sine, MD;  Location: [facility];  Service: Cardiovascular;  Laterality: N/A;   Surgery  left great toe     Tear ducts bilateral eyes     TRANSMETATARSAL AMPUTATION Right 07/08/2018   Social History   Occupational History   Occupation: disabled  Tobacco Use   Smoking status: Former    Packs/day: 0.30    Years: 48.00    Pack years: 14.40    Types: 36, Cigarettes    Start date: 07/02/1963    Quit date: 06/01/2019    Years since quitting: 1.7   Smokeless tobacco: Former  Scientific laboratory technician Use: Former  Substance and Sexual Activity   Alcohol use: Yes    Alcohol/week: 0.0 standard drinks   Drug use: No   Sexual activity: Not on file

## 2021-04-03 ENCOUNTER — Ambulatory Visit (INDEPENDENT_AMBULATORY_CARE_PROVIDER_SITE_OTHER): Payer: Medicare HMO | Admitting: Orthopedic Surgery

## 2021-04-03 ENCOUNTER — Encounter: Payer: Self-pay | Admitting: Orthopedic Surgery

## 2021-04-03 ENCOUNTER — Ambulatory Visit: Payer: Medicare HMO | Admitting: Orthopedic Surgery

## 2021-04-03 DIAGNOSIS — Z89511 Acquired absence of right leg below knee: Secondary | ICD-10-CM | POA: Diagnosis not present

## 2021-04-03 DIAGNOSIS — I739 Peripheral vascular disease, unspecified: Secondary | ICD-10-CM

## 2021-04-03 NOTE — Progress Notes (Signed)
Office Visit Note   Patient: Adam Ray           Date of Birth: 1955-11-13           MRN: [id number] Visit Date: 04/03/2021              Requested by: [name], [place],  [place] [address] PCP: [name], MD  No chief complaint on file.     HPI: Patient is a 65 year old gentleman who is about a year status post right below the knee amputation he is currently wearing prosthesis with a good fit he is seen in follow-up for evaluation of the left foot and left leg.  Patient is currently wearing compression stockings.  Assessment & Plan: Visit Diagnoses:  1. Acquired absence of right leg below knee ([place])   2. Peripheral vascular disease ([place])     Plan: Patient has mild maceration between the toes he was given felt pads to wear between the toes to help with the maceration and recommended changing his socks more frequently.  No changes with the prosthesis.  Follow-Up Instructions: Return in about 3 months (around 07/04/2021).   Ortho Exam  Patient is alert, oriented, no adenopathy, well-dressed, normal affect, normal respiratory effort. Examination patient has no plantar or venous ulcers in the left leg.  There is some venous swelling he is wearing compression stockings there is some mild maceration between the toes but no ulceration there is a valgus deformity of the great toe over the second toe.  Patient was given felt pads to place between the toes to prevent ulceration.  Imaging: No results found. No images are attached to the encounter.  Labs: Lab Results  Component Value Date   HGBA1C 7.0 02/27/2021   HGBA1C 11.9 (A) 12/06/2020   HGBA1C 11.3 (A) 08/10/2020   ESRSEDRATE 54 (H) 01/01/2020   ESRSEDRATE 8 01/29/2016   ESRSEDRATE 9 04/11/2012   CRP 2.6 (H) 01/29/2016   CRP 0.5 02/28/2014   REPTSTATUS 01/18/2020 FINAL 01/17/2020   REPTSTATUS 01/22/2020 FINAL 01/17/2020   REPTSTATUS 01/22/2020 FINAL 01/17/2020   GRAMSTAIN   04/11/2012    NO WBC SEEN RARE SQUAMOUS EPITHELIAL CELLS PRESENT MODERATE GRAM POSITIVE COCCI IN PAIRS RARE GRAM NEGATIVE RODS   GRAMSTAIN  04/11/2012    NO WBC SEEN RARE SQUAMOUS EPITHELIAL CELLS PRESENT MODERATE GRAM POSITIVE COCCI IN PAIRS RARE GRAM NEGATIVE RODS   CULT (A) 01/17/2020    <10,000 COLONIES/mL INSIGNIFICANT GROWTH Performed at [facility], [address] [place], [place] [address]    CULT  01/17/2020    NO GROWTH 5 DAYS Performed at [facility] [address], [place], [place] [address]    CULT  01/17/2020    NO GROWTH 5 DAYS Performed at [facility] [address], [place],  [address]    LABORGA NO GROWTH 2 DAYS 07/19/2014     Lab Results  Component Value Date   ALBUMIN 4.1 12/06/2020   ALBUMIN 4.3 12/01/2020   ALBUMIN 3.7 02/23/2020    No results found for: MG No results found for: VD25OH  No results found for: PREALBUMIN CBC EXTENDED Latest Ref Rng & Units 12/06/2020 12/01/2020 02/23/2020  WBC 3.4 - 10.8 x10E3/uL 5.5 6.3 3.9(L)  RBC 4.14 - 5.80 x10E6/uL 4.90 4.66 3.97(L)  HGB 13.0 - 17.7 g/dL 13.7 13.1 11.6(L)  HCT 37.5 - 51.0 % 39.8 38.8(L) 35.9(L)  PLT 150 - 450 x10E3/uL 138(L) 104.0(L) 178  NEUTROABS 1.4 - 7.7 K/uL - 4.8 -  LYMPHSABS 0.7 - 4.0 K/uL - 0.9 -     There is no height or weight on file to calculate BMI.  Orders:  No orders of the defined types were placed in this encounter.  No orders of the defined types were placed in this encounter.    Procedures: No procedures performed  Clinical Data: No additional findings.  ROS:  All other systems negative, except as noted in the HPI. Review of Systems  Objective: Vital Signs: There were no vitals taken for this visit.  Specialty Comments:  No specialty comments available.  PMFS History: Patient Active Problem List   Diagnosis Date Noted   Illness 12/06/2020   Urinary incontinence 08/10/2020   Blurred vision, bilateral 07/13/2020    Increased urinary frequency 05/09/2020   Dehiscence of amputation stump ([place])    Coronary artery disease 01/18/2020   Nocturnal leg cramps 10/28/2019   Hyperlipidemia 05/26/2019   Edema leg 12/29/2018   STEMI involving left circumflex coronary artery ([place]) 07/12/2018   Status post transmetatarsal amputation of foot, right ([place]) 07/08/2018   Limited mobility 05/12/2018   Gingival foreign body 07/22/2017   Anxiety state 06/02/2017   Leg swelling 01/29/2017   Idiopathic chronic venous hypertension of both lower extremities with inflammation 09/24/2016   Testicular mass 04/18/2016   Subacute osteomyelitis, right ankle and foot ([place]) 01/29/2016   Diabetic polyneuropathy associated with type 2 diabetes mellitus ([place])    Type 2 diabetes mellitus with neurologic complication, with long-term current use of insulin ([place]) 12/08/2015   Pulmonary nodule 02/01/2015   Depression 12/09/2013   Warts, genital 08/20/2013   Diabetic retinopathy ([place]) 01/28/2013   Sleep apnea 09/06/2010   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) 10/06/2006   SICKLE-CELL TRAIT 04/26/2006   ERECTILE DYSFUNCTION 04/26/2006   Tobacco abuse 04/26/2006   PAD (peripheral artery disease) ([place]) 04/26/2006   ALLERGIC RHINITIS 04/26/2006   Past Medical History:  Diagnosis Date   Allergy    Arthritis    Bronchitis    Cataract    CHF (congestive heart failure) ([place])    Chronic kidney disease    Claudication ([place])    right foot ray resection   Colon polyps    hyperplastic   COPD (chronic obstructive pulmonary disease) ([place])    Coronary artery disease    Diabetes mellitus    type II   Genital warts    Gout    Hyperlipidemia    Hypertension    Myocardial infarction ([place])    Osteomyelitis of third toe of right foot (H[facility])    Pneumonia    Status post amputation of toe of right foot ([place]) 09/24/2016    Family History  Problem Relation Age of Onset   Diabetes Mother     Stroke Mother    Heart failure Father    Heart attack Father    Diabetes Sister        multiple siblings   Diabetes Brother        muliple siblings   Heart disease Brother    Colon cancer Neg Hx    Esophageal cancer Neg Hx    Rectal cancer Neg Hx    Stomach cancer Neg Hx     Past Surgical History:  Procedure Laterality Date   AMPUTATION Right 01/31/2016   Procedure: Right 2nd Toe Amputation;  Surgeon: Newt Minion, MD;  Location: [facility];  Service: Orthopedics;  Laterality: Right;   AMPUTATION Right 07/08/2018   Procedure: RIGHT TRANSMETATARSAL AMPUTATION;  Surgeon: Newt Minion, MD;  Location: [facility];  Service: Orthopedics;  Laterality: Right;   AMPUTATION Right 01/05/2020   Procedure: RIGHT BELOW KNEE AMPUTATION;  Surgeon: Newt Minion, MD;  Location: [facility];  Service: Orthopedics;  Laterality: Right;   AMPUTATION Right 02/23/2020   Procedure: RIGHT BELOW KNEE AMPUTATION REVISION;  Surgeon: Newt Minion, MD;  Location: [facility];  Service: Orthopedics;  Laterality: Right;   CATARACT EXTRACTION     right eye   COLONOSCOPY     CORONARY STENT INTERVENTION N/A 07/12/2018   Procedure: CORONARY STENT INTERVENTION;  Surgeon: Troy Sine, MD;  Location: [facility];  Service: Cardiovascular;  Laterality: N/A;   CORONARY/GRAFT ACUTE MI REVASCULARIZATION N/A 07/12/2018   Procedure: Coronary/Graft Acute MI Revascularization;  Surgeon: Troy Sine, MD;  Location: [facility];  Service: Cardiovascular;  Laterality: N/A;   I & D EXTREMITY  04/11/2012   Procedure: IRRIGATION AND DEBRIDEMENT EXTREMITY;  Surgeon: Wylene Simmer, MD;  Location: [facility];  Service: Orthopedics;  Laterality: Right;   LEFT HEART CATH AND CORONARY ANGIOGRAPHY N/A 07/12/2018   Procedure: LEFT HEART CATH AND CORONARY ANGIOGRAPHY;  Surgeon: Troy Sine, MD;  Location: [facility];  Service: Cardiovascular;  Laterality: N/A;   Surgery left great toe     Tear ducts bilateral eyes     TRANSMETATARSAL  AMPUTATION Right 07/08/2018   Social History   Occupational History   Occupation: disabled  Tobacco Use   Smoking status: Former    Packs/day: 0.30    Years: 48.00    Pack years: 14.40    Types: 76, Cigarettes    Start date: 07/02/1963    Quit date: 06/01/2019    Years since quitting: 1.8   Smokeless tobacco: Former  Scientific laboratory technician Use: Former  Substance and Sexual Activity   Alcohol use: Yes    Alcohol/week: 0.0 standard drinks   Drug use: No   Sexual activity: Not on file

## 2021-05-15 ENCOUNTER — Other Ambulatory Visit: Payer: Self-pay

## 2021-05-22 MED ORDER — GABAPENTIN 100 MG PO CAPS: 100.0000 mg | ORAL_CAPSULE | Freq: Two times a day (BID) | ORAL | 2 refills | Status: DC

## 2021-05-22 MED ORDER — TAMSULOSIN HCL 0.4 MG PO CAPS: 0.4000 mg | ORAL_CAPSULE | Freq: Every day | ORAL | 3 refills | Status: DC

## 2021-05-22 NOTE — Telephone Encounter (Signed)
Naproxen refill not appropriate due to bleeding concerns and history of CAD  Will refill flomax and gabapentin

## 2021-06-05 ENCOUNTER — Ambulatory Visit: Payer: Medicare HMO | Admitting: Podiatry

## 2021-06-05 ENCOUNTER — Other Ambulatory Visit: Payer: Self-pay

## 2021-06-07 ENCOUNTER — Ambulatory Visit (INDEPENDENT_AMBULATORY_CARE_PROVIDER_SITE_OTHER): Payer: Medicare HMO | Admitting: Orthopedic Surgery

## 2021-06-07 DIAGNOSIS — Z89511 Acquired absence of right leg below knee: Secondary | ICD-10-CM

## 2021-06-07 DIAGNOSIS — B351 Tinea unguium: Secondary | ICD-10-CM

## 2021-06-11 ENCOUNTER — Encounter: Payer: Self-pay | Admitting: Orthopedic Surgery

## 2021-06-11 NOTE — Progress Notes (Signed)
Office Visit Note   Patient: Adam Ray           Date of Birth: [birth year]           MRN: [id number] Visit Date: 06/07/2021              Requested by: [name], MD [address] [place],  [place] [address] PCP: [name], MD  Chief Complaint  Patient presents with   Left Foot - Nail Problem      HPI: Patient is a 65 year old gentleman status post right transtibial amputation with venous ulceration and swelling of the left leg who presents in follow-up.  Assessment & Plan: Visit Diagnoses:  1. Acquired absence of right leg below knee ([place])   2. Onychomycosis     Plan: Nails were trimmed on the left without complication recommend continue with the extra-large compression sock.  Follow-Up Instructions: Return in about 3 months (around 09/05/2021).   Ortho Exam  Patient is alert, oriented, no adenopathy, well-dressed, normal affect, normal respiratory effort. Examination patient has no problems with the right transtibial amputation.  Left leg his calf is 40 cm in circumference he is currently wearing an extra-large sock and this appears to fit well.  He does have thickened discolored onychomycotic nails that were trimmed without complications.  Imaging: No results found. No images are attached to the encounter.  Labs: Lab Results  Component Value Date   HGBA1C 7.0 02/27/2021   HGBA1C 11.9 (A) 12/06/2020   HGBA1C 11.3 (A) 08/10/2020   ESRSEDRATE 54 (H) 01/01/2020   ESRSEDRATE 8 01/29/2016   ESRSEDRATE 9 04/11/2012   CRP 2.6 (H) 01/29/2016   CRP 0.5 02/28/2014   REPTSTATUS 01/18/2020 FINAL 01/17/2020   REPTSTATUS 01/22/2020 FINAL 01/17/2020   REPTSTATUS 01/22/2020 FINAL 01/17/2020   GRAMSTAIN  04/11/2012    NO WBC SEEN RARE SQUAMOUS EPITHELIAL CELLS PRESENT MODERATE GRAM POSITIVE COCCI IN PAIRS RARE GRAM NEGATIVE RODS   GRAMSTAIN  04/11/2012    NO WBC SEEN RARE SQUAMOUS EPITHELIAL CELLS PRESENT MODERATE GRAM POSITIVE COCCI IN  PAIRS RARE GRAM NEGATIVE RODS   CULT (A) 01/17/2020    <10,000 COLONIES/mL INSIGNIFICANT GROWTH Performed at [facility], [address], [place], [place] [address]    CULT  01/17/2020    NO GROWTH 5 DAYS Performed at [facility] [address], [place], [place] [address]    CULT  01/17/2020    NO GROWTH 5 DAYS Performed at [facility] [address], [place],  [address]    LABORGA NO GROWTH 2 DAYS 07/19/2014     Lab Results  Component Value Date   ALBUMIN 4.1 12/06/2020   ALBUMIN 4.3 12/01/2020   ALBUMIN 3.7 02/23/2020    No results found for: MG No results found for: VD25OH  No results found for: PREALBUMIN CBC EXTENDED Latest Ref Rng & Units 12/06/2020 12/01/2020 02/23/2020  WBC 3.4 - 10.8 x10E3/uL 5.5 6.3 3.9(L)  RBC 4.14 - 5.80 x10E6/uL 4.90 4.66 3.97(L)  HGB 13.0 - 17.7 g/dL 13.7 13.1 11.6(L)  HCT 37.5 - 51.0 % 39.8 38.8(L) 35.9(L)  PLT 150 - 450 x10E3/uL 138(L) 104.0(L) 178  NEUTROABS 1.4 - 7.7 K/uL - 4.8 -  LYMPHSABS 0.7 - 4.0 K/uL - 0.9 -     There is no height or weight on file to calculate BMI.  Orders:  No orders of the defined types were placed in this encounter.  No orders of the defined  types were placed in this encounter.    Procedures: No procedures performed  Clinical Data: No additional findings.  ROS:  All other systems negative, except as noted in the HPI. Review of Systems  Objective: Vital Signs: There were no vitals taken for this visit.  Specialty Comments:  No specialty comments available.  PMFS History: Patient Active Problem List   Diagnosis Date Noted   Illness 12/06/2020   Urinary incontinence 08/10/2020   Blurred vision, bilateral 07/13/2020   Increased urinary frequency 05/09/2020   Dehiscence of amputation stump ([place])    Coronary artery disease 01/18/2020   Nocturnal leg cramps 10/28/2019   Hyperlipidemia 05/26/2019   Edema leg 12/29/2018   STEMI involving left circumflex coronary  artery ([place]) 07/12/2018   Status post transmetatarsal amputation of foot, right ([place]) 07/08/2018   Limited mobility 05/12/2018   Gingival foreign body 07/22/2017   Anxiety state 06/02/2017   Leg swelling 01/29/2017   Idiopathic chronic venous hypertension of both lower extremities with inflammation 09/24/2016   Testicular mass 04/18/2016   Subacute osteomyelitis, right ankle and foot ([place]) 01/29/2016   Diabetic polyneuropathy associated with type 2 diabetes mellitus ([place])    Type 2 diabetes mellitus with neurologic complication, with long-term current use of insulin ([place]) 12/08/2015   Pulmonary nodule 02/01/2015   Depression 12/09/2013   Warts, genital 08/20/2013   Diabetic retinopathy ([place]) 01/28/2013   Sleep apnea 09/06/2010   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   Essential hypertension 11/09/2008   COPD, mild ([place]) 10/06/2006   SICKLE-CELL TRAIT 04/26/2006   ERECTILE DYSFUNCTION 04/26/2006   Tobacco abuse 04/26/2006   PAD (peripheral artery disease) ([place]) 04/26/2006   ALLERGIC RHINITIS 04/26/2006   Past Medical History:  Diagnosis Date   Allergy    Arthritis    Bronchitis    Cataract    CHF (congestive heart failure) ([place])    Chronic kidney disease    Claudication ([place])    right foot ray resection   Colon polyps    hyperplastic   COPD (chronic obstructive pulmonary disease) ([place])    Coronary artery disease    Diabetes mellitus    type II   Genital warts    Gout    Hyperlipidemia    Hypertension    Myocardial infarction ([place])    Osteomyelitis of third toe of right foot ([facility])    Pneumonia    Status post amputation of toe of right foot ([place]) 09/24/2016    Family History  Problem Relation Age of Onset   Diabetes Mother    Stroke Mother    Heart failure Father    Heart attack Father    Diabetes Sister        multiple siblings   Diabetes Brother        muliple siblings   Heart disease Brother    Colon cancer Neg Hx    Esophageal cancer Neg Hx    Rectal cancer  Neg Hx    Stomach cancer Neg Hx     Past Surgical History:  Procedure Laterality Date   AMPUTATION Right 01/31/2016   Procedure: Right 2nd Toe Amputation;  Surgeon: Newt Minion, MD;  Location: [facility];  Service: Orthopedics;  Laterality: Right;   AMPUTATION Right 07/08/2018   Procedure: RIGHT TRANSMETATARSAL AMPUTATION;  Surgeon: Newt Minion, MD;  Location: [facility];  Service: Orthopedics;  Laterality: Right;   AMPUTATION Right 01/05/2020   Procedure: RIGHT BELOW KNEE AMPUTATION;  Surgeon: Newt Minion, MD;  Location: [facility];  Service: Orthopedics;  Laterality: Right;   AMPUTATION Right 02/23/2020   Procedure: RIGHT BELOW KNEE AMPUTATION REVISION;  Surgeon: Newt Minion, MD;  Location: [facility];  Service: Orthopedics;  Laterality: Right;   CATARACT EXTRACTION     right eye   COLONOSCOPY     CORONARY STENT INTERVENTION N/A 07/12/2018   Procedure: CORONARY STENT INTERVENTION;  Surgeon: Troy Sine, MD;  Location: [facility];  Service: Cardiovascular;  Laterality: N/A;   CORONARY/GRAFT ACUTE MI REVASCULARIZATION N/A 07/12/2018   Procedure: Coronary/Graft Acute MI Revascularization;  Surgeon: Troy Sine, MD;  Location: [facility];  Service: Cardiovascular;  Laterality: N/A;   I & D EXTREMITY  04/11/2012   Procedure: IRRIGATION AND DEBRIDEMENT EXTREMITY;  Surgeon: Wylene Simmer, MD;  Location: [facility];  Service: Orthopedics;  Laterality: Right;   LEFT HEART CATH AND CORONARY ANGIOGRAPHY N/A 07/12/2018   Procedure: LEFT HEART CATH AND CORONARY ANGIOGRAPHY;  Surgeon: Troy Sine, MD;  Location: [facility];  Service: Cardiovascular;  Laterality: N/A;   Surgery left great toe     Tear ducts bilateral eyes     TRANSMETATARSAL AMPUTATION Right 07/08/2018   Social History   Occupational History   Occupation: disabled  Tobacco Use   Smoking status: Former    Packs/day: 0.30    Years: 48.00    Pack years: 14.40    Types: 52, Cigarettes    Start date: 07/02/1963     Quit date: 06/01/2019    Years since quitting: 2.0   Smokeless tobacco: Former  Scientific laboratory technician Use: Former  Substance and Sexual Activity   Alcohol use: Yes    Alcohol/week: 0.0 standard drinks   Drug use: No   Sexual activity: Not on file

## 2021-06-12 ENCOUNTER — Other Ambulatory Visit: Payer: Self-pay

## 2021-06-12 ENCOUNTER — Ambulatory Visit (INDEPENDENT_AMBULATORY_CARE_PROVIDER_SITE_OTHER): Payer: Medicare HMO

## 2021-06-12 DIAGNOSIS — Z23 Encounter for immunization: Secondary | ICD-10-CM

## 2021-06-12 NOTE — Progress Notes (Signed)
Patient presents to nurse clinic for flu and pneumonia vaccination. See immunization flow sheet. Patient tolerated injections well.   Talbot Grumbling, RN

## 2021-06-29 ENCOUNTER — Other Ambulatory Visit: Payer: Self-pay | Admitting: Family Medicine

## 2021-07-03 ENCOUNTER — Ambulatory Visit: Payer: Medicare HMO | Admitting: Family Medicine

## 2021-07-04 ENCOUNTER — Ambulatory Visit: Payer: Medicare HMO | Admitting: Family Medicine

## 2021-07-04 ENCOUNTER — Ambulatory Visit: Payer: Medicare HMO | Admitting: Cardiovascular Disease

## 2021-07-04 DIAGNOSIS — Z09 Encounter for follow-up examination after completed treatment for conditions other than malignant neoplasm: Secondary | ICD-10-CM

## 2021-07-05 ENCOUNTER — Ambulatory Visit: Payer: No Typology Code available for payment source | Admitting: Orthopedic Surgery

## 2021-07-09 ENCOUNTER — Ambulatory Visit: Payer: Medicare HMO | Admitting: Podiatry

## 2021-07-09 ENCOUNTER — Other Ambulatory Visit: Payer: Self-pay

## 2021-07-17 DIAGNOSIS — R69 Illness, unspecified: Secondary | ICD-10-CM | POA: Diagnosis not present

## 2021-08-08 DIAGNOSIS — R3916 Straining to void: Secondary | ICD-10-CM | POA: Diagnosis not present

## 2021-08-08 DIAGNOSIS — R3912 Poor urinary stream: Secondary | ICD-10-CM | POA: Diagnosis not present

## 2021-08-13 NOTE — Progress Notes (Unsigned)
° ° ° °  SUBJECTIVE:   CHIEF COMPLAINT / HPI:   Adam Ray is a 66 y.o. male presents for ***   ***  [facility] Office Visit from 12/28/2020 in [facility]  PHQ-9 Total Score 0        Health Maintenance Due  Topic   Zoster Vaccines- Shingrix (1 of 2)   OPHTHALMOLOGY EXAM    COVID-19 Vaccine (5 - Booster for Pfizer series)      PERTINENT  PMH / PSH: CAD, T2DM, COPD, HTN  OBJECTIVE:   There were no vitals taken for this visit.   General: Alert, no acute distress Cardio: Normal S1 and S2, RRR, no r/m/g Pulm: CTAB, normal work of breathing Abdomen: Bowel sounds normal. Abdomen soft and non-tender.  Extremities: No peripheral edema.  Neuro: Cranial nerves grossly intact   ASSESSMENT/PLAN:   No problem-specific Assessment & Plan notes found for this encounter.    Lattie Haw, MD PGY-3 [facility]

## 2021-08-14 ENCOUNTER — Other Ambulatory Visit: Payer: Self-pay

## 2021-08-14 ENCOUNTER — Encounter: Payer: Self-pay | Admitting: Podiatry

## 2021-08-14 ENCOUNTER — Ambulatory Visit (INDEPENDENT_AMBULATORY_CARE_PROVIDER_SITE_OTHER): Payer: Medicare Other | Admitting: Podiatry

## 2021-08-14 DIAGNOSIS — M79675 Pain in left toe(s): Secondary | ICD-10-CM | POA: Diagnosis not present

## 2021-08-14 DIAGNOSIS — E1151 Type 2 diabetes mellitus with diabetic peripheral angiopathy without gangrene: Secondary | ICD-10-CM | POA: Diagnosis not present

## 2021-08-14 DIAGNOSIS — S88119A Complete traumatic amputation at level between knee and ankle, unspecified lower leg, initial encounter: Secondary | ICD-10-CM

## 2021-08-14 DIAGNOSIS — B351 Tinea unguium: Secondary | ICD-10-CM | POA: Diagnosis not present

## 2021-08-14 DIAGNOSIS — I739 Peripheral vascular disease, unspecified: Secondary | ICD-10-CM

## 2021-08-14 NOTE — Progress Notes (Signed)
This patient returns to my office for at risk foot care.  This patient requires this care by a professional since this patient will be at risk due to having diabetic neuropathy and PAD.    This patient is unable to cut nails himself since the patient cannot reach his nails.These nails are painful walking and wearing shoes. He presents to the office with his wife. This patient presents for at risk foot care today.  General Appearance  Alert, conversant and in no acute stress.  Vascular  Dorsalis pedis and posterior tibial  pulses are  not palpable  Left..  Capillary return is within normal limits . Cold feet  left. bilaterally.  Neurologic  Senn-Weinstein monofilament wire test diminished  left. Muscle power within normal limits left.  Nails Thick disfigured discolored nails with subungual debris  from hallux to fifth toes left. No evidence of bacterial infection or drainage left.  Orthopedic  No limitations of motion  feet .  No crepitus or effusions noted.  No bony pathology or digital deformities noted. BK amputation right.  HAV  left.  Hammer toes 2-5  B/L.  Prominent metatarsal left.  Skin  normotropic skin with no porokeratosis noted .  No signs of infections or ulcers noted.     Onychomycosis   Pain in left toes  Consent was obtained for treatment procedures.   Mechanical debridement of nails 1-5  left performed with a nail nipper.  Filed with dremel without incident. Patient qualifies for diabetic shoes.  Patient to make an appointment with pedorthist.   Return office visit   3 months                  Told patient to return for periodic foot care and evaluation due to potential at risk complications.   Gardiner Barefoot DPM

## 2021-08-15 ENCOUNTER — Ambulatory Visit: Payer: Commercial Managed Care - HMO

## 2021-08-16 ENCOUNTER — Other Ambulatory Visit: Payer: Self-pay

## 2021-08-16 ENCOUNTER — Ambulatory Visit (INDEPENDENT_AMBULATORY_CARE_PROVIDER_SITE_OTHER): Payer: Medicare Other | Admitting: Family Medicine

## 2021-08-16 VITALS — BP 132/74 | HR 62 | Ht 70.0 in | Wt 184.2 lb

## 2021-08-16 DIAGNOSIS — E11319 Type 2 diabetes mellitus with unspecified diabetic retinopathy without macular edema: Secondary | ICD-10-CM

## 2021-08-16 DIAGNOSIS — R319 Hematuria, unspecified: Secondary | ICD-10-CM

## 2021-08-16 DIAGNOSIS — H538 Other visual disturbances: Secondary | ICD-10-CM

## 2021-08-16 LAB — POCT URINALYSIS DIP (MANUAL ENTRY)
Bilirubin, UA: NEGATIVE
Glucose, UA: NEGATIVE mg/dL
Ketones, POC UA: NEGATIVE mg/dL
Leukocytes, UA: NEGATIVE
Nitrite, UA: NEGATIVE
Protein Ur, POC: NEGATIVE mg/dL
Spec Grav, UA: 1.02 (ref 1.010–1.025)
Urobilinogen, UA: 2 E.U./dL — AB
pH, UA: 6.5 (ref 5.0–8.0)

## 2021-08-16 LAB — POCT UA - MICROSCOPIC ONLY

## 2021-08-16 NOTE — Assessment & Plan Note (Signed)
New, started last night.  Most concerned about bladder carcinoma given 10-pack-year history (1 pack/week x 50 years).  Also considered UTI and cystitis.  UA negative for leuks and nitrites, will order urine culture to be sure.  UA did demonstrate small blood and 2.0 urobilinogen; urine micro showed 5-10 RBCs.  Has established urologist, recommend they have an appointment there soon.  Return precautions given, see AVS for more.

## 2021-08-16 NOTE — Assessment & Plan Note (Signed)
Gradually worsening.  Known diabetic retinopathy, our system indicates last saw ophthalmology 2021.  No red flag symptoms or gross field deficits.  Most likely worsening of the diabetic retinopathy, also considered acute glaucoma, retinal detachment.  Has appointment with his ophthalmologist on March 2, encouraged to keep this.  No indications for ED at this time.  See AVS for more.

## 2021-08-16 NOTE — Progress Notes (Signed)
SUBJECTIVE:   CHIEF COMPLAINT / HPI:   Hematuria - onset last night - patient confident that blood from urethra and not rectum - no UTI symptoms - retention issues or hypertrophy symptoms at this time - known BPH, takes flomax daily - UA and micro ordered - does see Urologist for BPH, cannot recall who it is  Vision difficulty - last ophthalmology exam on file 02/14/2020 - has known history of diabetic retinopathy - worsening blurry vision - R worse than L - no acute changes, missing fields, headaches, eye pain, diplopia  - sees Dr. Vira Agar at [facility], next appointment March 2nd  Lab Results  Component Value Date   HGBA1C 7.0 02/27/2021   HGBA1C 11.9 (A) 12/06/2020   HGBA1C 11.3 (A) 08/10/2020   Lab Results  Component Value Date   MICROALBUR 0.5 09/08/2014   [place] 33 12/06/2020   CREATININE 1.07 12/06/2020    PERTINENT  PMH / PSH:  Patient Active Problem List   Diagnosis Date Noted   Hematuria 08/16/2021   Illness 12/06/2020   Urinary incontinence 08/10/2020   Blurred vision, bilateral 07/13/2020   Increased urinary frequency 05/09/2020   Dehiscence of amputation stump ([place])    Coronary artery disease 01/18/2020   Nocturnal leg cramps 10/28/2019   Hyperlipidemia 05/26/2019   Edema leg 12/29/2018   STEMI involving left circumflex coronary artery ([place]) 07/12/2018   Status post transmetatarsal amputation of foot, right ([place]) 07/08/2018   Limited mobility 05/12/2018   Gingival foreign body 07/22/2017   Anxiety state 06/02/2017   Leg swelling 01/29/2017   Idiopathic chronic venous hypertension of both lower extremities with inflammation 09/24/2016   Testicular mass 04/18/2016   Subacute osteomyelitis, right ankle and foot ([place]) 01/29/2016   Diabetic polyneuropathy associated with type 2 diabetes mellitus ([place])    Type 2 diabetes mellitus with neurologic complication, with long-term current use of insulin ([place]) 12/08/2015   Pulmonary nodule 02/01/2015    Depression 12/09/2013   Warts, genital 08/20/2013   Diabetic retinopathy ([place]) 01/28/2013   Sleep apnea 09/06/2010   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   Essential hypertension 11/09/2008   COPD, mild ([place]) 10/06/2006   SICKLE-CELL TRAIT 04/26/2006   ERECTILE DYSFUNCTION 04/26/2006   Tobacco abuse 04/26/2006   PAD (peripheral artery disease) ([place]) 04/26/2006   ALLERGIC RHINITIS 04/26/2006     OBJECTIVE:   BP 132/74    Pulse 62    Ht 5\' 10"  (1.778 m)    Wt 184 lb 4 oz (83.6 kg)    SpO2 100%    BMI 26.44 kg/m    Vision Screening   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 20/70 20/100  With correction     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     PHQ-9:  Depression screen [facility] 2/9 08/16/2021 02/27/2021 12/28/2020  Decreased Interest 0 0 0  Down, Depressed, Hopeless 0 0 0  PHQ - 2 Score 0 0 0  Altered sleeping 0 - 0  Tired, decreased energy 0 - 0  Change in appetite 0 - 0  Feeling bad or failure about yourself  0 - 0  Trouble concentrating 0 - 0  Moving slowly or fidgety/restless 0 - 0  Suicidal thoughts 0 - 0  PHQ-9 Score 0 - 0  Difficult doing work/chores - - -  Some recent data might be hidden     GAD-7:  GAD 7 : Generalized Anxiety Score 06/16/2017 06/02/2017  Nervous, Anxious, on Edge 1  0  Control/stop worrying 2 2  Worry too much - different things 3 2  Trouble relaxing 0 2  Restless 0 1  Easily annoyed or irritable 0 0  Afraid - awful might happen 1 3  Total GAD 7 Score 7 10  Anxiety Difficulty - Somewhat difficult    Physical Exam General: Awake, alert, oriented HEENT: PERRL, EOM intact, nasal mucosa slightly edematous, oral mucosa pink, moist Cardiovascular: Regular rate and rhythm, S1 and S2 present, no murmurs auscultated Respiratory: Lung fields clear to auscultation bilaterally Abdomen: Soft, nondistended, no TTP in any quadrant, no rebound tenderness or guarding  ASSESSMENT/PLAN:   Blurred vision, bilateral Gradually worsening.   Known diabetic retinopathy, our system indicates last saw ophthalmology 2021.  No red flag symptoms or gross field deficits.  Most likely worsening of the diabetic retinopathy, also considered acute glaucoma, retinal detachment.  Has appointment with his ophthalmologist on March 2, encouraged to keep this.  No indications for ED at this time.  See AVS for more.  Hematuria New, started last night.  Most concerned about bladder carcinoma given 10-pack-year history (1 pack/week x 50 years).  Also considered UTI and cystitis.  UA negative for leuks and nitrites, will order urine culture to be sure.  UA did demonstrate small blood and 2.0 urobilinogen; urine micro showed 5-10 RBCs.  Has established urologist, recommend they have an appointment there soon.  Return precautions given, see AVS for more.     Ezequiel Essex, MD [place]

## 2021-08-16 NOTE — Patient Instructions (Addendum)
It was wonderful to see you today. Thank you for allowing me to be a part of your care. Below is a short summary of what we discussed at your visit today:  Vision trouble Please keep your appointment with [place] ophthalmology and Dr. Pearline Cables would that you have on March 2.  Blood in urine It looks like your urologist is at the [facility] (name below).  This is from your chart records that I am able to see.  Please call them directly to make an appointment to evaluate this blood in your urine.  Your urine sample today did show blood.  Given your smoking history, the urologist may need to visualize your bladder.   [facility] Cons Consult[facility] [place]    Diabetes Please make an appointment with your PCP to come back to get your A1c checked and discuss your diabetes regimen.   Health Maintenance We like to think about ways to keep you healthy for years to come. Below are some interventions and screenings we can offer to keep you healthy: - COVID booster (get here in clinic) - Shingles vaccine (get at your favorite pharmacy) - Ophthalmology exam (order send today)  Please bring all of your medications to every appointment!  If you have any questions or concerns, please do not hesitate to contact us via phone or MyChart message.   Ezequiel Essex, MD

## 2021-08-17 LAB — URINALYSIS, MICROSCOPIC ONLY
Bacteria, UA: NONE SEEN
Casts: NONE SEEN /lpf

## 2021-08-19 LAB — URINE CULTURE

## 2021-08-23 ENCOUNTER — Other Ambulatory Visit: Payer: Medicare Other

## 2021-08-29 ENCOUNTER — Encounter (HOSPITAL_COMMUNITY): Payer: Self-pay

## 2021-08-29 ENCOUNTER — Other Ambulatory Visit: Payer: Self-pay

## 2021-08-29 ENCOUNTER — Ambulatory Visit (HOSPITAL_COMMUNITY)
Admission: EM | Admit: 2021-08-29 | Discharge: 2021-08-29 | Disposition: A | Discharge status: Home / Self Care | Payer: Medicare Other | Attending: Family Medicine | Admitting: Family Medicine

## 2021-08-29 DIAGNOSIS — M436 Torticollis: Secondary | ICD-10-CM | POA: Diagnosis not present

## 2021-08-29 MED ORDER — TIZANIDINE HCL 4 MG PO CAPS: 4.0000 mg | ORAL_CAPSULE | Freq: Three times a day (TID) | ORAL | 0 refills | Status: DC

## 2021-08-29 NOTE — ED Triage Notes (Signed)
Pt reports neck pain that started this morning. States the pain stays at his neck and sometimes moves down to his right arm . ?

## 2021-08-30 ENCOUNTER — Other Ambulatory Visit: Payer: Self-pay

## 2021-08-30 ENCOUNTER — Encounter: Payer: Self-pay | Admitting: Family Medicine

## 2021-08-30 ENCOUNTER — Ambulatory Visit (INDEPENDENT_AMBULATORY_CARE_PROVIDER_SITE_OTHER): Payer: Medicare Other | Admitting: Family Medicine

## 2021-08-30 VITALS — BP 130/73 | HR 60 | Ht 70.0 in | Wt 184.2 lb

## 2021-08-30 DIAGNOSIS — M436 Torticollis: Secondary | ICD-10-CM

## 2021-08-30 DIAGNOSIS — T148XXA Other injury of unspecified body region, initial encounter: Secondary | ICD-10-CM | POA: Diagnosis not present

## 2021-08-30 MED ORDER — DICLOFENAC SODIUM 1 % EX GEL: 2.0000 g | Freq: Four times a day (QID) | CUTANEOUS | 0 refills | Status: DC

## 2021-08-30 MED ORDER — BACLOFEN 5 MG PO TABS: 5.0000 mg | ORAL_TABLET | Freq: Three times a day (TID) | ORAL | 0 refills | Status: DC | PRN

## 2021-08-30 NOTE — Patient Instructions (Signed)
For your neck/shoulder pain, I am sending in a medication called Voltaren gel that you can use up to 4 times daily.  This may be cheaper over-the-counter so make sure to ask the pharmacist regarding the cost. ? ?I want you to discard/throw away the medications you got from the urgent care (tizanidine).  I am going to send you in a medication called baclofen that is a little bit less sedating (less likely to make you tired).  There is still caution with using this medication and I would advise your first dose to be before you go to bed. ?

## 2021-08-30 NOTE — ED Provider Notes (Signed)
[name] ? ? ?[id number] ?08/29/21 Arrival Time: 5726 ? ?ASSESSMENT & PLAN: ? ?1. Torticollis, acute   ? ?Onset today; ques from strain. No trauma. ?Begin trial of: ?Meds ordered this encounter  ?Medications  ? tiZANidine (ZANAFLEX) 4 MG capsule  ?  Sig: Take 1 capsule (4 mg total) by mouth 3 (three) times daily.  ?  Dispense:  15 capsule  ?  Refill:  0  ? ?OTC analgesics as needed. Encourage ROM. ? ? Follow-up Information   ? ? [name], MD.   ?Specialty: Family Medicine ?Why: As needed. ?Contact information: ?[address] ?[place] [address] ?[phone] ? ? ?  ?  ? ? [facility] at [facility].   ?Specialty: Urgent Care ?Why: If worsening or failing to improve as anticipated. ?Contact information: ?[address] ?[place] [address] ?[phone] ? ?  ?  ? ?  ?  ? ?  ? ? ?Will f/u with his doctor or here if not seeing significant improvement within one week. ? ?Reviewed expectations re: course of current medical issues. Questions answered. ?Outlined signs and symptoms indicating need for more acute intervention. ?Patient verbalized understanding. ?After Visit Summary given. ? ?SUBJECTIVE: ?History from: patient. ?Adam Ray is a 66 y.o. male who presents with complaint of a R-sided neck pain/soreness; abrupt onset this morning. "Felt like something popped and my neck was sore". Happened while bending forward. No HA or visual changes. Ambulatory without assistance. No extremity sensation changes or weakness.  ?No tx PTA. ? ?OBJECTIVE: ? ?Vitals:  ? 08/29/21 1611  ?BP: (!) 148/80  ?Pulse: 76  ?Resp: 17  ?Temp: 98.1 ?F (36.7 ?C)  ?TempSrc: Oral  ?SpO2: 93%  ?  ? ?GCS: 15 ?General appearance: alert; no distress ?HEENT: normocephalic; atraumatic; conjunctivae normal; TMs normal; no bleeding from ears; oral mucosa normal ?Neck: supple with FROM but moves slowly; no midline tenderness; does have tenderness of cervical musculature extending over  trapezius distribution on the RIGHT ?Lungs: clear to auscultation bilaterally; unlabored ?Extremities: moves all extremities normally; no edema; symmetrical with no gross deformities ?Skin: warm and dry ?Neurologic: gait normal but slow; normal sensation and strength of bilateral UE ?Psychological: alert and cooperative; normal mood and affect ? ? ?Allergies  ?Allergen Reactions  ? Codeine Other (See Comments)  ?  Heart attack.  ? ?Past Medical History:  ?Diagnosis Date  ? Allergy   ? Arthritis   ? Bronchitis   ? Cataract   ? CHF (congestive heart failure) ([place])   ? Chronic kidney disease   ? Claudication [facility])   ? right foot ray resection  ? Colon polyps   ? hyperplastic  ? COPD (chronic obstructive pulmonary disease) ([place])   ? Coronary artery disease   ? Diabetes mellitus   ? type II  ? Genital warts   ? Gout   ? Hyperlipidemia   ? Hypertension   ? Myocardial infarction [facility])   ? Osteomyelitis of third toe of right foot ([place])   ? Pneumonia   ? Status post amputation of toe of right foot ([place]) 09/24/2016  ? ?Past Surgical History:  ?Procedure Laterality Date  ? AMPUTATION Right 01/31/2016  ? Procedure: Right 2nd Toe Amputation;  Surgeon: Newt Minion, MD;  Location: [facility];  Service: Orthopedics;  Laterality: Right;  ? AMPUTATION Right 07/08/2018  ? Procedure: RIGHT TRANSMETATARSAL AMPUTATION;  Surgeon: Newt Minion, MD;  Location: [place];  Service: Orthopedics;  Laterality: Right;  ?  AMPUTATION Right 01/05/2020  ? Procedure: RIGHT BELOW KNEE AMPUTATION;  Surgeon: Newt Minion, MD;  Location: [place];  Service: Orthopedics;  Laterality: Right;  ? AMPUTATION Right 02/23/2020  ? Procedure: RIGHT BELOW KNEE AMPUTATION REVISION;  Surgeon: Newt Minion, MD;  Location: [place];  Service: Orthopedics;  Laterality: Right;  ? CATARACT EXTRACTION    ? right eye  ? COLONOSCOPY    ? CORONARY STENT INTERVENTION N/A 07/12/2018  ? Procedure: CORONARY STENT INTERVENTION;  Surgeon: Troy Sine, MD;  Location: [facility];   Service: Cardiovascular;  Laterality: N/A;  ? CORONARY/GRAFT ACUTE MI REVASCULARIZATION N/A 07/12/2018  ? Procedure: Coronary/Graft Acute MI Revascularization;  Surgeon: Troy Sine, MD;  Location: [place] [facility];  Service: Cardiovascular;  Laterality: N/A;  ? I & D EXTREMITY  04/11/2012  ? Procedure: IRRIGATION AND DEBRIDEMENT EXTREMITY;  Surgeon: Wylene Simmer, MD;  Location: [place];  Service: Orthopedics;  Laterality: Right;  ? LEFT HEART CATH AND CORONARY ANGIOGRAPHY N/A 07/12/2018  ? Procedure: LEFT HEART CATH AND CORONARY ANGIOGRAPHY;  Surgeon: Troy Sine, MD;  Location: [place] [facility];  Service: Cardiovascular;  Laterality: N/A;  ? Surgery left great toe    ? Tear ducts bilateral eyes    ? TRANSMETATARSAL AMPUTATION Right 07/08/2018  ? ?Family History  ?Problem Relation Age of Onset  ? Diabetes Mother   ? Stroke Mother   ? Heart failure Father   ? Heart attack Father   ? Diabetes Sister   ?     multiple siblings  ? Diabetes Brother   ?     muliple siblings  ? Heart disease Brother   ? Colon cancer Neg Hx   ? Esophageal cancer Neg Hx   ? Rectal cancer Neg Hx   ? Stomach cancer Neg Hx   ? ?Social History  ? ?Socioeconomic History  ? Marital status: Married  ?  Spouse name: Not on file  ? Number of children: 3  ? Years of education: Not on file  ? Highest education level: Not on file  ?Occupational History  ? Occupation: disabled  ?Tobacco Use  ? Smoking status: Former  ?  Packs/day: 0.30  ?  Years: 48.00  ?  Pack years: 14.40  ?  Types: Cigars, Cigarettes  ?  Start date: 07/02/1963  ?  Quit date: 06/01/2019  ?  Years since quitting: 2.2  ? Smokeless tobacco: Former  ?Vaping Use  ? Vaping Use: Former  ?Substance and Sexual Activity  ? Alcohol use: Yes  ?  Alcohol/week: 0.0 standard drinks  ? Drug use: No  ? Sexual activity: Not on file  ?Other Topics Concern  ? Not on file  ?Social History Narrative  ? Not on file  ? ?Social Determinants of Health  ? ?Financial Resource Strain: Not on file  ?Food  Insecurity: Not on file  ?Transportation Needs: Not on file  ?Physical Activity: Not on file  ?Stress: Not on file  ?Social Connections: Not on file  ? ? ? ? ? ? ? ?  ?Vanessa Kick, MD ?08/30/21 1111 ? ?

## 2021-08-30 NOTE — Progress Notes (Signed)
? ? ?  SUBJECTIVE:  ? ?CHIEF COMPLAINT / HPI:  ? ?Patient reports right-sided neck pain/soreness that was abrupt and started yesterday and then it felt like "something popped" in his neck/shoulder while he was pushing himself up.  The pain does sometimes radiate down to his elbow but it does not originate from his neck.  He has limited ability to move his arm around but is not because of it. ? ?He was seen at the urgent care yesterday and given a prescription of tizanidine, which he has not started.  He states he did not use it because he was afraid it would make him sleepy and he is someone who drives.  He has not tried the medication and is not using anything else to help the area besides [place] and has used ice intermittently. ? ?PERTINENT  PMH / PSH: Reviewed ? ?OBJECTIVE:  ? ?BP 130/73   Pulse 60   Ht 5\' 10"  (1.778 m)   Wt 184 lb 3.2 oz (83.6 kg)   SpO2 100%   BMI 26.43 kg/m?   ?General: NAD, well-appearing, well-nourished ?Respiratory: No respiratory distress, breathing comfortably, able to speak in full sentences ?Skin: warm and dry, no rashes noted on exposed skin ?Psych: Appropriate affect and mood' ?Right Shoulder: ?Inspection reveals no obvious deformity, atrophy.  Mild swelling of the right trapezius. No bruising. No swelling ?Palpation TTP over the belly of the upper trapezius muscle, no tenderness over AC joint or bicipital groove. ?Decreased ROM in all directions ?NV intact distally ?Normal scapular function observed. ?Special Tests:  ?- Impingement: Pain with empty can sign neg [place], neers ?- Supraspinatous: Positive empty can.  4/5 strength with resisted flexion at 20 degrees ?- Subscapularis: negative belly press, negative bear hug.  ?- Biceps tendon: Negative Speeds, Yerrgason's  ? ? ?ASSESSMENT/PLAN:  ? ?Muscle strain/torticollis ?Patient was seen at urgent care on 3/1 and diagnosed with muscular strain and torticollis.  Patient presented for the same presentation, he had not taken any of  the medications.  Given better safety profile, will switch to baclofen instead of tizanidine. ?- Voltaren gel as needed ?- Patient to discard tizanidine, expressed understanding (as did his wife) ?- Baclofen 5 mg 3 times daily as needed ?- Return precautions given if not improving in the next 1-2 weeks ? ? ?Gerry Blanchfield, DO ?[place]  ?

## 2021-08-31 ENCOUNTER — Other Ambulatory Visit: Payer: Self-pay | Admitting: Family Medicine

## 2021-09-06 ENCOUNTER — Other Ambulatory Visit: Payer: Self-pay

## 2021-09-06 ENCOUNTER — Ambulatory Visit (INDEPENDENT_AMBULATORY_CARE_PROVIDER_SITE_OTHER): Payer: Medicare Other | Admitting: Orthopedic Surgery

## 2021-09-06 DIAGNOSIS — B351 Tinea unguium: Secondary | ICD-10-CM | POA: Diagnosis not present

## 2021-09-06 DIAGNOSIS — Z89511 Acquired absence of right leg below knee: Secondary | ICD-10-CM | POA: Diagnosis not present

## 2021-09-06 DIAGNOSIS — I87323 Chronic venous hypertension (idiopathic) with inflammation of bilateral lower extremity: Secondary | ICD-10-CM | POA: Diagnosis not present

## 2021-09-09 ENCOUNTER — Encounter: Payer: Self-pay | Admitting: Orthopedic Surgery

## 2021-09-09 NOTE — Progress Notes (Signed)
Office Visit Note   Patient: Adam Ray           Date of Birth: 1956-02-27           MRN: [id number] Visit Date: 09/06/2021              Requested by: [name], MD [address] [place],  [place] [address] PCP: [name], MD  Chief Complaint  Patient presents with   Left Foot - Follow-up    Nail trim       HPI: Patient is a 66 year old gentleman who presents with multiple medical problems.  Patient complains of swelling in his leg with venous insufficiency complains of poor fitting prosthesis on the right and onychomycotic nails on the left.  Assessment & Plan: Visit Diagnoses:  1. Acquired absence of right leg below knee ([place])   2. Onychomycosis   3. Idiopathic chronic venous hypertension of both lower extremities with inflammation     Plan: Patient was given a prescription for biotech to evaluate for new socket liner materials and supplies.  Recommended that he use a sock underneath the liner for the sweating and dermatitis.  Offered a compression wrap for the left lower extremity which patient did not want to proceed with.  Follow-Up Instructions: Return in about 4 weeks (around 10/04/2021).   Ortho Exam  Patient is alert, oriented, no adenopathy, well-dressed, normal affect, normal respiratory effort. Examination patient has significant sweating and fluid within his liner there is dermatitis of the skin.  There is no cellulitis no signs of infection.  Examination of the left foot patient has thickened discolored onychomycotic nails x5.  After informed consent the nails were trimmed x5 without complication.  Patient has significant swelling of the left leg with venous insufficiency there is no cellulitis no ulcers there is significant pitting edema up to the tibial tubercle.  Recommended a compression wrap patient wanted to continue with his compression socks and would not proceed with a compression wrap.  Imaging: No results found. No images  are attached to the encounter.  Labs: Lab Results  Component Value Date   HGBA1C 7.0 02/27/2021   HGBA1C 11.9 (A) 12/06/2020   HGBA1C 11.3 (A) 08/10/2020   ESRSEDRATE 54 (H) 01/01/2020   ESRSEDRATE 8 01/29/2016   ESRSEDRATE 9 04/11/2012   CRP 2.6 (H) 01/29/2016   CRP 0.5 02/28/2014   REPTSTATUS 01/18/2020 FINAL 01/17/2020   REPTSTATUS 01/22/2020 FINAL 01/17/2020   REPTSTATUS 01/22/2020 FINAL 01/17/2020   GRAMSTAIN  04/11/2012    NO WBC SEEN RARE SQUAMOUS EPITHELIAL CELLS PRESENT MODERATE GRAM POSITIVE COCCI IN PAIRS RARE GRAM NEGATIVE RODS   GRAMSTAIN  04/11/2012    NO WBC SEEN RARE SQUAMOUS EPITHELIAL CELLS PRESENT MODERATE GRAM POSITIVE COCCI IN PAIRS RARE GRAM NEGATIVE RODS   CULT (A) 01/17/2020    <10,000 COLONIES/mL INSIGNIFICANT GROWTH Performed at [facility], [address] [place], [place] [address]    CULT  01/17/2020    NO GROWTH 5 DAYS Performed at [facility] [address], [place], [place] [address]    CULT  01/17/2020    NO GROWTH 5 DAYS Performed at [facility] [address] [place], [place] [address]    LABORGA NO GROWTH 2 DAYS 07/19/2014     Lab Results  Component Value Date   ALBUMIN 4.1 12/06/2020   ALBUMIN 4.3 12/01/2020   ALBUMIN 3.7 02/23/2020    No results found for: MG No results  found for: VD25OH  No results found for: PREALBUMIN CBC EXTENDED Latest Ref Rng & Units 12/06/2020 12/01/2020 02/23/2020  WBC 3.4 - 10.8 x10E3/uL 5.5 6.3 3.9(L)  RBC 4.14 - 5.80 x10E6/uL 4.90 4.66 3.97(L)  HGB 13.0 - 17.7 g/dL 13.7 13.1 11.6(L)  HCT 37.5 - 51.0 % 39.8 38.8(L) 35.9(L)  PLT 150 - 450 x10E3/uL 138(L) 104.0(L) 178  NEUTROABS 1.4 - 7.7 K/uL - 4.8 -  LYMPHSABS 0.7 - 4.0 K/uL - 0.9 -     There is no height or weight on file to calculate BMI.  Orders:  No orders of the defined types were placed in this encounter.  No orders of the defined types were placed in this encounter.    Procedures: No procedures  performed  Clinical Data: No additional findings.  ROS:  All other systems negative, except as noted in the HPI. Review of Systems  Objective: Vital Signs: There were no vitals taken for this visit.  Specialty Comments:  No specialty comments available.  PMFS History: Patient Active Problem List   Diagnosis Date Noted   Hematuria 08/16/2021   Illness 12/06/2020   Urinary incontinence 08/10/2020   Blurred vision, bilateral 07/13/2020   Increased urinary frequency 05/09/2020   Dehiscence of amputation stump ([place])    Coronary artery disease 01/18/2020   Nocturnal leg cramps 10/28/2019   Hyperlipidemia 05/26/2019   Edema leg 12/29/2018   STEMI involving left circumflex coronary artery ([place]) 07/12/2018   Status post transmetatarsal amputation of foot, right ([place]) 07/08/2018   Limited mobility 05/12/2018   Gingival foreign body 07/22/2017   Anxiety state 06/02/2017   Leg swelling 01/29/2017   Idiopathic chronic venous hypertension of both lower extremities with inflammation 09/24/2016   Testicular mass 04/18/2016   Subacute osteomyelitis, right ankle and foot ([place]) 01/29/2016   Diabetic polyneuropathy associated with type 2 diabetes mellitus ([place])    Type 2 diabetes mellitus with neurologic complication, with long-term current use of insulin ([place]) 12/08/2015   Pulmonary nodule 02/01/2015   Depression 12/09/2013   Warts, genital 08/20/2013   Diabetic retinopathy ([place]) 01/28/2013   Sleep apnea 09/06/2010   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   Essential hypertension 11/09/2008   COPD, mild ([place]) 10/06/2006   SICKLE-CELL TRAIT 04/26/2006   ERECTILE DYSFUNCTION 04/26/2006   Tobacco abuse 04/26/2006   PAD (peripheral artery disease) ([place]) 04/26/2006   ALLERGIC RHINITIS 04/26/2006   Past Medical History:  Diagnosis Date   Allergy    Arthritis    Bronchitis    Cataract    CHF (congestive heart failure) ([place])    Chronic kidney disease    Claudication ([place])    right foot  ray resection   Colon polyps    hyperplastic   COPD (chronic obstructive pulmonary disease) ([place])    Coronary artery disease    Diabetes mellitus    type II   Genital warts    Gout    Hyperlipidemia    Hypertension    Myocardial infarction ([place])    Osteomyelitis of third toe of right foot ([place])    Pneumonia    Status post amputation of toe of right foot ([place]) 09/24/2016    Family History  Problem Relation Age of Onset   Diabetes Mother    Stroke Mother    Heart failure Father    Heart attack Father    Diabetes Sister        multiple siblings   Diabetes Brother        muliple siblings  Heart disease Brother    Colon cancer Neg Hx    Esophageal cancer Neg Hx    Rectal cancer Neg Hx    Stomach cancer Neg Hx     Past Surgical History:  Procedure Laterality Date   AMPUTATION Right 01/31/2016   Procedure: Right 2nd Toe Amputation;  Surgeon: Newt Minion, MD;  Location: [facility];  Service: Orthopedics;  Laterality: Right;   AMPUTATION Right 07/08/2018   Procedure: RIGHT TRANSMETATARSAL AMPUTATION;  Surgeon: Newt Minion, MD;  Location: [facility];  Service: Orthopedics;  Laterality: Right;   AMPUTATION Right 01/05/2020   Procedure: RIGHT BELOW KNEE AMPUTATION;  Surgeon: Newt Minion, MD;  Location: [facility];  Service: Orthopedics;  Laterality: Right;   AMPUTATION Right 02/23/2020   Procedure: RIGHT BELOW KNEE AMPUTATION REVISION;  Surgeon: Newt Minion, MD;  Location: [facility];  Service: Orthopedics;  Laterality: Right;   CATARACT EXTRACTION     right eye   COLONOSCOPY     CORONARY STENT INTERVENTION N/A 07/12/2018   Procedure: CORONARY STENT INTERVENTION;  Surgeon: Troy Sine, MD;  Location: [facility];  Service: Cardiovascular;  Laterality: N/A;   CORONARY/GRAFT ACUTE MI REVASCULARIZATION N/A 07/12/2018   Procedure: Coronary/Graft Acute MI Revascularization;  Surgeon: Troy Sine, MD;  Location: [facility];  Service: Cardiovascular;  Laterality: N/A;   I & D  EXTREMITY  04/11/2012   Procedure: IRRIGATION AND DEBRIDEMENT EXTREMITY;  Surgeon: Wylene Simmer, MD;  Location: [facility];  Service: Orthopedics;  Laterality: Right;   LEFT HEART CATH AND CORONARY ANGIOGRAPHY N/A 07/12/2018   Procedure: LEFT HEART CATH AND CORONARY ANGIOGRAPHY;  Surgeon: Troy Sine, MD;  Location: [facility];  Service: Cardiovascular;  Laterality: N/A;   Surgery left great toe     Tear ducts bilateral eyes     TRANSMETATARSAL AMPUTATION Right 07/08/2018   Social History   Occupational History   Occupation: disabled  Tobacco Use   Smoking status: Former    Packs/day: 0.30    Years: 48.00    Pack years: 14.40    Types: 22, Cigarettes    Start date: 07/02/1963    Quit date: 06/01/2019    Years since quitting: 2.2   Smokeless tobacco: Former  Scientific laboratory technician Use: Former  Substance and Sexual Activity   Alcohol use: Yes    Alcohol/week: 0.0 standard drinks   Drug use: No   Sexual activity: Not on file

## 2021-09-12 ENCOUNTER — Ambulatory Visit: Payer: Medicare Other | Admitting: Family Medicine

## 2021-09-13 NOTE — Progress Notes (Signed)
? ? ?  SUBJECTIVE:  ? ?CHIEF COMPLAINT / HPI:  ? ?Accompanied by wife today.  ? ?Blood in urine ?For 2 weeks associated with dysuria and difficulty urinating. Last urinated at 8 am this morning. Desires STD testing. Denies fever, unintentional weight loss, night sweats, penile lesions and new sexual partner.  ? ?Diabetes ?Current Regimen: Novolog 40 units TID, Lantus 30 units, metformin 500 mg twice daily ?CBGs: 200s  ?Last A1c: 7.0 on 02/27/21  ?Denies polyuria, polydipsia, hypoglycemia ?Last Eye Exam: 2021 ?Statin: Crestor 40 mg daily ?ACE/ARB: n/a ? ?PERTINENT  PMH / PSH: HTN, PAD, hx of testicular mass ? ?OBJECTIVE:  ? ?BP 124/62   Pulse 67   Wt 181 lb 6.4 oz (82.3 kg)   SpO2 98%   BMI 26.03 kg/m?  ?Physical Exam ?Vitals reviewed.  ?Constitutional:   ?   General: He is not in acute distress. ?   Appearance: He is not ill-appearing, toxic-appearing or diaphoretic.  ?Cardiovascular:  ?   Rate and Rhythm: Normal rate and regular rhythm.  ?Pulmonary:  ?   Effort: Pulmonary effort is normal.  ?   Breath sounds: Normal breath sounds.  ?Abdominal:  ?   Palpations: Abdomen is soft. There is no mass.  ?   Tenderness: There is no abdominal tenderness. There is no right CVA tenderness, left CVA tenderness or guarding.  ?Neurological:  ?   Mental Status: He is alert and oriented to person, place, and time.  ?Psychiatric:     ?   Mood and Affect: Mood normal.     ?   Behavior: Behavior normal.  ? ?ASSESSMENT/PLAN:  ? ?1. Type 2 diabetes mellitus with diabetic autonomic neuropathy, with long-term current use of insulin ([place]) ?A1c 7.8 today. No medication changes today; concern for low health literacy surrounding medication regimen. Would ideally like to have a visit with pharmacy and have patient bring all meds prior to making changes. Next A1c in 3 months.  ? ?2. Gross hematuria  Dysuria ?Ongoing for 2 weeks. Concern for infection v. Malignancy. Negative B symptoms at this time. Will work up for infection (STI, UTI) and  refer to urology. Prior PSA 08/10/20 was 0.1 unchanged since 2017.  ?- HIV antibody (with reflex) ?- RPR ?- Urine Culture ?- Urine cytology ancillary only ?- POCT urinalysis dipstick ?- Ambulatory referral to Urology  ? ? ?Ghassan Coggeshall Autry-Lott, DO ?[facility]  ?

## 2021-09-13 NOTE — Patient Instructions (Addendum)
It was wonderful to see you today. ? ?Please bring ALL of your medications with you to every visit.  ? ?Today we talked about: ? ?Follow-up with our pharmacy team and bring all of your medicines to that visit for medication reconciliation. ? ?Improving your diet to improve your diabetes. ? ?I have referred you to urology due to blood in your urine. ? ?Please be sure to schedule follow up at the front  desk before you leave today.  ? ?If you haven't already, sign up for My Chart to have easy access to your labs results, and communication with your primary care physician. ? ?Please call the clinic at [phone] if your symptoms worsen or you have any concerns. It was our pleasure to serve you. ? ?Dr. Janus Molder ?

## 2021-09-14 ENCOUNTER — Telehealth: Payer: Self-pay | Admitting: Family Medicine

## 2021-09-14 ENCOUNTER — Ambulatory Visit (INDEPENDENT_AMBULATORY_CARE_PROVIDER_SITE_OTHER): Payer: Medicare Other | Admitting: Family Medicine

## 2021-09-14 ENCOUNTER — Encounter: Payer: Self-pay | Admitting: Family Medicine

## 2021-09-14 ENCOUNTER — Other Ambulatory Visit (HOSPITAL_COMMUNITY)
Admission: RE | Admit: 2021-09-14 | Discharge: 2021-09-14 | Disposition: A | Discharge status: Home / Self Care | Payer: Medicare Other | Source: Ambulatory Visit | Attending: Family Medicine | Admitting: Family Medicine

## 2021-09-14 ENCOUNTER — Other Ambulatory Visit: Payer: Self-pay

## 2021-09-14 VITALS — BP 124/62 | HR 67 | Wt 181.4 lb

## 2021-09-14 DIAGNOSIS — Z794 Long term (current) use of insulin: Secondary | ICD-10-CM

## 2021-09-14 DIAGNOSIS — R300 Dysuria: Secondary | ICD-10-CM | POA: Insufficient documentation

## 2021-09-14 DIAGNOSIS — E1143 Type 2 diabetes mellitus with diabetic autonomic (poly)neuropathy: Secondary | ICD-10-CM | POA: Diagnosis not present

## 2021-09-14 DIAGNOSIS — R310 Gross hematuria: Secondary | ICD-10-CM

## 2021-09-14 LAB — POCT URINALYSIS DIP (MANUAL ENTRY)
Bilirubin, UA: NEGATIVE
Blood, UA: NEGATIVE
Glucose, UA: 100 mg/dL — AB
Ketones, POC UA: NEGATIVE mg/dL
Leukocytes, UA: NEGATIVE
Nitrite, UA: NEGATIVE
Protein Ur, POC: NEGATIVE mg/dL
Spec Grav, UA: 1.025 (ref 1.010–1.025)
Urobilinogen, UA: 0.2 E.U./dL
pH, UA: 5 (ref 5.0–8.0)

## 2021-09-14 LAB — POCT GLYCOSYLATED HEMOGLOBIN (HGB A1C): HbA1c, POC (controlled diabetic range): 7.8 % — AB (ref 0.0–7.0)

## 2021-09-14 NOTE — Telephone Encounter (Signed)
**  After Hours/ Emergency Line Call** ? ?Received a page to call Adam Ray.  Pt states he got an e-mail about a result.   He states he had STD testing in the office today.  Reviewed results (a1c, UA) with patient. Explained that STD testing results have not yet returned.  Will forward to Dr. Janus Molder who saw pt in clinic to day to see if she by chance messaged/contacted the patient.  ? ? ?[name], DO ?PGY-3, [facility] ?09/14/2021 5:26 PM  ? ? ?

## 2021-09-15 LAB — RPR: RPR Ser Ql: NONREACTIVE

## 2021-09-15 LAB — HIV ANTIBODY (ROUTINE TESTING W REFLEX): HIV Screen 4th Generation wRfx: NONREACTIVE

## 2021-09-16 LAB — URINE CULTURE: Organism ID, Bacteria: NO GROWTH

## 2021-09-17 LAB — URINE CYTOLOGY ANCILLARY ONLY
Chlamydia: NEGATIVE
Comment: NEGATIVE
Comment: NEGATIVE
Comment: NORMAL
Neisseria Gonorrhea: NEGATIVE
Trichomonas: NEGATIVE

## 2021-09-24 ENCOUNTER — Ambulatory Visit: Payer: Medicare Other | Admitting: Pharmacist

## 2021-10-04 ENCOUNTER — Ambulatory Visit: Payer: Medicare Other | Admitting: Orthopedic Surgery

## 2021-10-09 ENCOUNTER — Ambulatory Visit (INDEPENDENT_AMBULATORY_CARE_PROVIDER_SITE_OTHER): Payer: Medicare Other

## 2021-10-09 DIAGNOSIS — Z23 Encounter for immunization: Secondary | ICD-10-CM | POA: Diagnosis not present

## 2021-10-18 LAB — HM DIABETES EYE EXAM

## 2021-11-01 ENCOUNTER — Ambulatory Visit (INDEPENDENT_AMBULATORY_CARE_PROVIDER_SITE_OTHER): Payer: Medicare Other | Admitting: Family Medicine

## 2021-11-01 ENCOUNTER — Encounter: Payer: Self-pay | Admitting: Family Medicine

## 2021-11-01 ENCOUNTER — Other Ambulatory Visit: Payer: Self-pay

## 2021-11-01 VITALS — BP 124/70 | HR 60 | Wt 185.0 lb

## 2021-11-01 DIAGNOSIS — Z794 Long term (current) use of insulin: Secondary | ICD-10-CM

## 2021-11-01 DIAGNOSIS — N5313 Anejaculatory orgasm: Secondary | ICD-10-CM

## 2021-11-01 DIAGNOSIS — E1143 Type 2 diabetes mellitus with diabetic autonomic (poly)neuropathy: Secondary | ICD-10-CM | POA: Diagnosis not present

## 2021-11-01 NOTE — Progress Notes (Signed)
?  Date of Visit: 11/01/2021  ? ?SUBJECTIVE:  ? ?HPI: ? ?Adam Ray presents today to discuss issue with ejaculation. ? ?Diabetes - currently taking lantus 30u twice daily and novolog 40u three times daily with meals. This AM sugar was 40. This was unusually low for him. Typically 105-110s while fasting. Ate some candy to bring sugar up. He has upcoming appointment with his [facility] PCP next week. ? ?Ejaculation problem - for the last 3 months has been unable to ejaculate during intercourse. He is able to orgasm, just does not ejaculate semen during that process. He is very concerned about this, and says his wife is also concerned. About one month ago there was some blood in produced after an orgasm. He has seen urology for this issue, most recently in the last two weeks. He reports they found no cause for this issue and were not concerned about it being a problem. Records from most recent urology visit ([facility]) are not visible in our system today. ? ?Unable to thoroughly complete medication reconciliation today as patient is unsure which medications he is taking. ? ?OBJECTIVE:  ? ?BP 124/70   Pulse 60   Wt 185 lb (83.9 kg)   SpO2 97%   BMI 26.54 kg/m?  ?Gen: no acute distress, pleasant, cooperative ?HEENT: normocephalic, atraumatic  ?Lungs: normal work of breathing  ?Neuro: alert, speech normal, grossly nonfocal ? ?ASSESSMENT/PLAN:  ? ?Health maintenance:  ?-last eye visit was last week at [facility], will abstract in ? ?Anejaculation ?Has already had workup by urology. Discussed that as they are experts in this area, I defer any specific workup or recommendations to them. Encouraged him to request second opinion by [facility] urologist if he would like. Will request records from most recent [facility] visit. ? ?Diabetes ?Advised continuing to check sugars, if has more lows like this AM to let us know as may need to adjust insulin. He did not want to adjust insulin based on this one specific sugar. ? ?Advised bringing  medications to every appointment  ? ?Dodson. [name], MD ?[facility] ?

## 2021-11-01 NOTE — Patient Instructions (Signed)
It was great to see you again today! ? ?Recommend talking to [facility] doctor about seeing [facility] urologist for second opinion. ? ?Keep checking sugars fasting, if any low sugars like this AM please let us know because we will likely need to adjust medication. ? ?Bring all medications to every appointment so we can make sure our records are accurate. ? ?Be well, ?Dr. [name] ?

## 2021-11-06 ENCOUNTER — Ambulatory Visit: Payer: Medicare Other | Admitting: Family Medicine

## 2021-11-06 ENCOUNTER — Other Ambulatory Visit: Payer: Self-pay

## 2021-11-09 MED ORDER — TAMSULOSIN HCL 0.4 MG PO CAPS: 0.4000 mg | ORAL_CAPSULE | Freq: Every day | ORAL | 3 refills | Status: DC

## 2021-11-13 ENCOUNTER — Encounter: Payer: Self-pay | Admitting: Podiatry

## 2021-11-13 ENCOUNTER — Ambulatory Visit (INDEPENDENT_AMBULATORY_CARE_PROVIDER_SITE_OTHER): Payer: Medicare Other | Admitting: Podiatry

## 2021-11-13 DIAGNOSIS — B351 Tinea unguium: Secondary | ICD-10-CM

## 2021-11-13 DIAGNOSIS — S88119A Complete traumatic amputation at level between knee and ankle, unspecified lower leg, initial encounter: Secondary | ICD-10-CM

## 2021-11-13 DIAGNOSIS — E1151 Type 2 diabetes mellitus with diabetic peripheral angiopathy without gangrene: Secondary | ICD-10-CM

## 2021-11-13 DIAGNOSIS — M79675 Pain in left toe(s): Secondary | ICD-10-CM

## 2021-11-13 DIAGNOSIS — I739 Peripheral vascular disease, unspecified: Secondary | ICD-10-CM | POA: Diagnosis not present

## 2021-11-13 DIAGNOSIS — L84 Corns and callosities: Secondary | ICD-10-CM | POA: Diagnosis not present

## 2021-11-13 NOTE — Progress Notes (Signed)
This patient returns to my office for at risk foot care.  This patient requires this care by a professional since this patient will be at risk due to having diabetic neuropathy and PAD.    This patient is unable to cut nails himself since the patient cannot reach his nails.These nails are painful walking and wearing shoes. He presents to the office with his wife. This patient presents for at risk foot care today. ? ?General Appearance  Alert, conversant and in no acute stress. ? ?Vascular  Dorsalis pedis and posterior tibial  pulses are  not palpable  Left..  Capillary return is within normal limits . Cold feet  left. bilaterally. ? ?Neurologic  Senn-Weinstein monofilament wire test diminished  left. Muscle power within normal limits left. ? ?Nails Thick disfigured discolored nails with subungual debris  from hallux to fifth toes left. No evidence of bacterial infection or drainage left. ? ?Orthopedic  No limitations of motion  feet .  No crepitus or effusions noted.  No bony pathology or digital deformities noted. BK amputation right.  HAV  left.  Hammer toes 2-5 left foot..  Prominent metatarsal left. ? ?Skin  normotropic skin with no porokeratosis noted .  No signs of infections or ulcers noted.    ? ?Onychomycosis   Pain in left toes ? ?Consent was obtained for treatment procedures.   Mechanical debridement of nails 1-5  left performed with a nail nipper.  Filed with dremel without incident. Patient qualifies for diabetic shoes.  Patient to make an appointment with pedorthist. ? ? ?Return office visit   3 months                  Told patient to return for periodic foot care and evaluation due to potential at risk complications. ? ? ?Gardiner Barefoot DPM  ?

## 2021-11-20 ENCOUNTER — Ambulatory Visit: Payer: Medicare Other | Admitting: Family Medicine

## 2021-12-04 ENCOUNTER — Encounter: Payer: Self-pay | Admitting: *Deleted

## 2021-12-13 ENCOUNTER — Ambulatory Visit (INDEPENDENT_AMBULATORY_CARE_PROVIDER_SITE_OTHER): Payer: Medicare Other | Admitting: Family Medicine

## 2021-12-13 ENCOUNTER — Other Ambulatory Visit (HOSPITAL_COMMUNITY)
Admission: RE | Admit: 2021-12-13 | Discharge: 2021-12-13 | Disposition: A | Discharge status: Home / Self Care | Payer: Medicare Other | Source: Ambulatory Visit | Attending: Family Medicine | Admitting: Family Medicine

## 2021-12-13 VITALS — BP 111/65 | HR 76 | Ht 70.0 in | Wt 176.8 lb

## 2021-12-13 DIAGNOSIS — Z113 Encounter for screening for infections with a predominantly sexual mode of transmission: Secondary | ICD-10-CM | POA: Insufficient documentation

## 2021-12-13 NOTE — Progress Notes (Signed)
    SUBJECTIVE:   CHIEF COMPLAINT / HPI:   Concern for STDs: Patient's spouse presents with the patient with come concerned that he may have an STD.  She thought that he had a rash on the underside of the penis after they had intercourse recently.  She also states that after intercourse he sometimes has a drainage from his penis.  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  She request STD testing for him.  On further discussion with the patient he would like to get STD testing.  PERTINENT  PMH / PSH: History of BPH  OBJECTIVE:   BP 111/65   Pulse 76   Ht '5\' 10"'$  (1.778 m)   Wt 176 lb 12.8 oz (80.2 kg)   SpO2 99%   BMI 25.37 kg/m    General: NAD, pleasant, able to participate in exam Respiratory: No respiratory distress GU: No lesions seen around the head or the base of the penis in the region where his spouse was concerned about a rash.  He does have some normal-appearing skin wrinkles in that region but no vesicles, open lesions, or signs of healed lesions present.  No drainage seen from the tip of the penis during my exam. Psych: Normal affect and mood  ASSESSMENT/PLAN:   Concern for possible STI: Patient presents with his spouse with concern for STD due to drainage from the tip of the penis.  He request STD testing.  She was initially concerned about a rash on the side of the penis but this is not seen on my physical exam.  There is some wrinkled skin in that region.  With no rash, vesicles, or signs of recent vesicles or rashes do not think there is a reason to swab or do treatment for genital herpes.  On further discussion she is more concerned about some drainage from the tip of his penis after intercourse.  He does take Flomax and has had a history of anejaculatory orgasm which sometimes does lead to some drainage from the tip of the penis after orgasm.  I discussed this with them but as the patient is interested in doing STD testing we will perform  this today.  We will let him know the result when it returns.    Lurline Del, [place]

## 2021-12-13 NOTE — Patient Instructions (Addendum)
We are checking some labs today and I will let you know the results when it returns.  We will decide next steps once we get these results.

## 2021-12-14 LAB — URINE CYTOLOGY ANCILLARY ONLY
Chlamydia: NEGATIVE
Comment: NEGATIVE
Comment: NORMAL
Neisseria Gonorrhea: NEGATIVE

## 2021-12-14 LAB — HIV ANTIBODY (ROUTINE TESTING W REFLEX): HIV Screen 4th Generation wRfx: NONREACTIVE

## 2021-12-14 LAB — RPR: RPR Ser Ql: NONREACTIVE

## 2021-12-17 ENCOUNTER — Telehealth: Payer: Self-pay

## 2021-12-17 NOTE — Telephone Encounter (Signed)
Patient calls nurse line requesting results from visit on 12/13/21.  Advised of negative results per result note.   Patient has no further questions at this time.   Talbot Grumbling, RN

## 2022-01-11 ENCOUNTER — Ambulatory Visit: Payer: Medicare Other | Admitting: Family

## 2022-01-11 LAB — HM DIABETES EYE EXAM

## 2022-01-17 ENCOUNTER — Encounter: Payer: Self-pay | Admitting: Family Medicine

## 2022-01-17 ENCOUNTER — Ambulatory Visit (INDEPENDENT_AMBULATORY_CARE_PROVIDER_SITE_OTHER): Payer: Medicare Other | Admitting: Family Medicine

## 2022-01-17 VITALS — BP 134/71 | HR 69 | Wt 175.8 lb

## 2022-01-17 DIAGNOSIS — Z794 Long term (current) use of insulin: Secondary | ICD-10-CM

## 2022-01-17 DIAGNOSIS — Z720 Tobacco use: Secondary | ICD-10-CM

## 2022-01-17 DIAGNOSIS — Z122 Encounter for screening for malignant neoplasm of respiratory organs: Secondary | ICD-10-CM | POA: Diagnosis not present

## 2022-01-17 DIAGNOSIS — I10 Essential (primary) hypertension: Secondary | ICD-10-CM

## 2022-01-17 DIAGNOSIS — F32A Depression, unspecified: Secondary | ICD-10-CM

## 2022-01-17 DIAGNOSIS — M7989 Other specified soft tissue disorders: Secondary | ICD-10-CM | POA: Diagnosis not present

## 2022-01-17 DIAGNOSIS — E1141 Type 2 diabetes mellitus with diabetic mononeuropathy: Secondary | ICD-10-CM

## 2022-01-17 MED ORDER — FUROSEMIDE 20 MG PO TABS: 20.0000 mg | ORAL_TABLET | Freq: Every day | ORAL | 0 refills | Status: DC

## 2022-01-17 NOTE — Patient Instructions (Signed)
It was great seeing you today!  Today we are checking your work to check your liver, cholesterol, and A1c.  I will call you if anything is abnormal, or send a letter if normal.  I have referred you to psychiatry for a PTSD assessment.   I have also placed an order for your lung cancer screening, we will schedule your appointment and give you a call with that date and time.  We have also schedule you for a physical with your PCP.  Please bring in all of your medications at that time.   Visit Reminders: - Stop by the pharmacy to pick up your lasix refill - Continue to work on your healthy eating habits and incorporating exercise into your daily life.   Feel free to call with any questions or concerns at any time, at [phone].   Take care,  Dr. Shary Key [facility]

## 2022-01-17 NOTE — Progress Notes (Signed)
    SUBJECTIVE:   CHIEF COMPLAINT / HPI:   Patient reports needing some things done at this clinic visit by the [facility]. States he has a PCP with the [place] as well. Requesting to get liver checked and evaluate for PTSD .  Denies any SI/HI. Denies feeling sad or anxious. Open to seeing a psychiatrist for PTSD evaluation   Requests lung cancer screen. Denies any particular symptoms. Smoked from 9 years and quit 3 years ago. Smoked about a half pack a day at that time.   Requesting liver checked. Denies abdominal pain. Drinks 1-2 shots of liquor a night.   Unclear of exactly what medications he is taking. Recommended bringing them at his next appt with PCP.  PERTINENT  PMH / PSH: Reviewed   OBJECTIVE:   BP 134/71   Pulse 69   Wt 175 lb 12.8 oz (79.7 kg)   SpO2 96%   BMI 25.22 kg/m    Physical exam General: well appearing, NAD Cardiovascular: RRR, no murmurs Lungs: CTAB. Normal WOB Abdomen: soft, non-distended, non-tender Skin: warm, dry. Edema of L leg to knee  Psych: mood and affect appropriate. Speech non pressured.   ASSESSMENT/PLAN:   No problem-specific Assessment & Plan notes found for this encounter.   Tobacco use Approx 27 pack year smoking history. Lung cancer screen ordered per patient request.   Type 2 diabetes A1c 9.4, down from 11.3 one year ago. Currently on Metformin '500mg'$  BID, Trulicity .'75mg'$  weekly per chart review. Appt scheduled with PCP for diabetes follow up and adjustment of medications which he was advised to bring all meds with him at that time.  LE edema  Patient with chronic edema, on exam  up to left knee. Right leg with amputation. Refilled lasix '20mg'$  daily. Recommended continue using compression sock, and to prop up leg when not on it.   Misc. - Patient requesting PTSD evaluation from PCP at the [place]. No immediate concerns today. No concern about mood. Denies SI/HI. Referral placed to psych  - Will check CMP for liver enzymes per patient request as  well as CBC, lipid panel  - Scheduled follow up with PCP and recommended bringing in medications   Shary Key, [place]

## 2022-01-18 ENCOUNTER — Telehealth: Payer: Self-pay

## 2022-01-18 LAB — CBC
Hematocrit: 39.6 % (ref 37.5–51.0)
Hemoglobin: 13.5 g/dL (ref 13.0–17.7)
MCH: 29.1 pg (ref 26.6–33.0)
MCHC: 34.1 g/dL (ref 31.5–35.7)
MCV: 85 fL (ref 79–97)
Platelets: 104 10*3/uL — ABNORMAL LOW (ref 150–450)
RBC: 4.64 x10E6/uL (ref 4.14–5.80)
RDW: 13.1 % (ref 11.6–15.4)
WBC: 3.8 10*3/uL (ref 3.4–10.8)

## 2022-01-18 LAB — COMPREHENSIVE METABOLIC PANEL
ALT: 23 IU/L (ref 0–44)
AST: 19 IU/L (ref 0–40)
Albumin/Globulin Ratio: 2 (ref 1.2–2.2)
Albumin: 4.5 g/dL (ref 3.9–4.9)
Alkaline Phosphatase: 115 IU/L (ref 44–121)
BUN/Creatinine Ratio: 12 (ref 10–24)
BUN: 14 mg/dL (ref 8–27)
Bilirubin Total: 0.4 mg/dL (ref 0.0–1.2)
CO2: 25 mmol/L (ref 20–29)
Calcium: 9.1 mg/dL (ref 8.6–10.2)
Chloride: 98 mmol/L (ref 96–106)
Creatinine, Ser: 1.2 mg/dL (ref 0.76–1.27)
Globulin, Total: 2.2 g/dL (ref 1.5–4.5)
Glucose: 492 mg/dL — ABNORMAL HIGH (ref 70–99)
Potassium: 4.6 mmol/L (ref 3.5–5.2)
Sodium: 134 mmol/L (ref 134–144)
Total Protein: 6.7 g/dL (ref 6.0–8.5)
eGFR: 67 mL/min/{1.73_m2} (ref >59)

## 2022-01-18 LAB — HEMOGLOBIN A1C
Est. average glucose Bld gHb Est-mCnc: 223 mg/dL
Hgb A1c MFr Bld: 9.4 % — ABNORMAL HIGH (ref 4.8–5.6)

## 2022-01-18 LAB — LIPID PANEL
Chol/HDL Ratio: 2.4 ratio (ref 0.0–5.0)
Cholesterol, Total: 97 mg/dL — ABNORMAL LOW (ref 100–199)
HDL: 40 mg/dL (ref >39)
LDL Chol Calc (NIH): 14 mg/dL (ref 0–99)
Triglycerides: 299 mg/dL — ABNORMAL HIGH (ref 0–149)
VLDL Cholesterol Cal: 43 mg/dL — ABNORMAL HIGH (ref 5–40)

## 2022-01-18 NOTE — Telephone Encounter (Signed)
Patient calls nurse line requesting to speak with Dr. Arby Barrette regarding continued neck pain. Patient states that he discussed this at visit yesterday with Dr. Arby Barrette. Patient is rating pain at 10/10. Ibuprofen has not been helping.   Patient is requesting that muscle relaxer [facility] [address].   Advised patient that due to pain be significantly worse, I would recommend being seen in UC.   Patient declines at this time.   Will forward to Dr. Arby Barrette.   Please advise.   Talbot Grumbling, RN

## 2022-01-20 MED ORDER — TIZANIDINE HCL 4 MG PO CAPS: 4.0000 mg | ORAL_CAPSULE | Freq: Three times a day (TID) | ORAL | 0 refills | Status: DC

## 2022-01-20 NOTE — Assessment & Plan Note (Signed)
A1c 9.4, down from 11.3 one year ago. Currently on Metformin '500mg'$  BID, Trulicity .'75mg'$  weekly per chart review. Appt scheduled with PCP for diabetes follow up and adjustment of medications which he was advised to bring all meds with him at that time.

## 2022-01-20 NOTE — Assessment & Plan Note (Signed)
Patient with chronic edema, on exam up to left knee. Right leg with amputation. Refilled lasix '20mg'$  daily. Recommended continue using compression sock, and to prop up leg when not on it.

## 2022-01-21 ENCOUNTER — Telehealth: Payer: Self-pay | Admitting: *Deleted

## 2022-01-21 NOTE — Telephone Encounter (Signed)
Contacted pt and informed him of his CT that is scheduled 01/25/22 @ [facility].  It is scheduled at 3pm. Carlton Sweaney Katharina Caper, CMA

## 2022-01-22 ENCOUNTER — Encounter: Payer: Self-pay | Admitting: Family

## 2022-01-22 ENCOUNTER — Ambulatory Visit (INDEPENDENT_AMBULATORY_CARE_PROVIDER_SITE_OTHER): Payer: Medicare Other | Admitting: Family

## 2022-01-22 ENCOUNTER — Ambulatory Visit (INDEPENDENT_AMBULATORY_CARE_PROVIDER_SITE_OTHER): Payer: Medicare Other

## 2022-01-22 DIAGNOSIS — Z89511 Acquired absence of right leg below knee: Secondary | ICD-10-CM | POA: Diagnosis not present

## 2022-01-22 NOTE — Progress Notes (Signed)
Office Visit Note   Patient: Adam Ray           Date of Birth: [birth year]           MRN: [id number] Visit Date: 01/22/2022              Requested by: [name], MD [address] [place],  [place] [address] PCP: [name], MD  Chief Complaint  Patient presents with   Right Leg - Pain      HPI: The patient is a 66 year old gentleman who is status post right below-knee amputation he states that he is having significant issues with his new prosthesis.  He was given a new socket about 6 months ago he states he has had some issues with this he has had padding and modification Xu his socket but this continues to fit poorly he complains that when he places his residual limb in the socket it will twisted and curved he states over the course of the day he experiences volume loss he does don extra ply but the socket continues to fit poorly.  He has had significant pain to the tibia and anterior shin from end bearing this has not improved with padding.  He currently does not have any ulcers but fears skin breakdown  Assessment & Plan: Visit Diagnoses:  1. Acquired absence of right leg below knee [facility])     Plan: Discussed management with the patient.  Unfortunately he will need to repeat turn to his prosthetists for modification to his socket or new fabrication of the socket.  Order given today.  Follow-Up Instructions: Return if symptoms worsen or fail to improve.   Ortho Exam  Patient is alert, oriented, no adenopathy, well-dressed, normal affect, normal respiratory effort. On examination of the right residual limb the limb is well consolidated.  There is no callus buildup today no erythema no impending skin breakdown  Imaging: XR Tibia/Fibula Right  Result Date: 01/22/2022 Radiographs of the right tibia and fibula show surgical transtibial amputation.  There is no spurring no complicating feature  No images are attached to the encounter.  Labs: Lab  Results  Component Value Date   HGBA1C 9.4 (H) 01/17/2022   HGBA1C 7.8 (A) 09/14/2021   HGBA1C 7.0 02/27/2021   ESRSEDRATE 54 (H) 01/01/2020   ESRSEDRATE 8 01/29/2016   ESRSEDRATE 9 04/11/2012   CRP 2.6 (H) 01/29/2016   CRP 0.5 02/28/2014   REPTSTATUS 01/18/2020 FINAL 01/17/2020   REPTSTATUS 01/22/2020 FINAL 01/17/2020   REPTSTATUS 01/22/2020 FINAL 01/17/2020   GRAMSTAIN  04/11/2012    NO WBC SEEN RARE SQUAMOUS EPITHELIAL CELLS PRESENT MODERATE GRAM POSITIVE COCCI IN PAIRS RARE GRAM NEGATIVE RODS   GRAMSTAIN  04/11/2012    NO WBC SEEN RARE SQUAMOUS EPITHELIAL CELLS PRESENT MODERATE GRAM POSITIVE COCCI IN PAIRS RARE GRAM NEGATIVE RODS   CULT (A) 01/17/2020    <10,000 COLONIES/mL INSIGNIFICANT GROWTH Performed at [facility], [address], [place], [place] [address]    CULT  01/17/2020    NO GROWTH 5 DAYS Performed at [facility] [address], [place], [place] [address]    CULT  01/17/2020    NO GROWTH 5 DAYS Performed at [facility] [address] [place], [place] [address]    LABORGA NO GROWTH 2 DAYS 07/19/2014     Lab Results  Component Value Date   ALBUMIN 4.5 01/17/2022   ALBUMIN 4.1 12/06/2020   ALBUMIN 4.3 12/01/2020  No results found for: "MG" No results found for: "VD25OH"  No results found for: "PREALBUMIN"    Latest Ref Rng & Units 01/17/2022    3:14 PM 12/06/2020    4:42 PM 12/01/2020    3:51 PM  CBC EXTENDED  WBC 3.4 - 10.8 x10E3/uL 3.8  5.5  6.3   RBC 4.14 - 5.80 x10E6/uL 4.64  4.90  4.66   Hemoglobin 13.0 - 17.7 g/dL 13.5  13.7  13.1   HCT 37.5 - 51.0 % 39.6  39.8  38.8   Platelets 150 - 450 x10E3/uL 104  138  104.0   NEUT# 1.4 - 7.7 K/uL   4.8   Lymph# 0.7 - 4.0 K/uL   0.9      There is no height or weight on file to calculate BMI.  Orders:  Orders Placed This Encounter  Procedures   XR Tibia/Fibula Right   No orders of the defined types were placed in this encounter.    Procedures: No procedures  performed  Clinical Data: No additional findings.  ROS:  All other systems negative, except as noted in the HPI. Review of Systems  Objective: Vital Signs: There were no vitals taken for this visit.  Specialty Comments:  No specialty comments available.  PMFS History: Patient Active Problem List   Diagnosis Date Noted   Hematuria 08/16/2021   Illness 12/06/2020   Urinary incontinence 08/10/2020   Blurred vision, bilateral 07/13/2020   Increased urinary frequency 05/09/2020   Dehiscence of amputation stump ([place])    Coronary artery disease 01/18/2020   Nocturnal leg cramps 10/28/2019   Hyperlipidemia 05/26/2019   Edema leg 12/29/2018   STEMI involving left circumflex coronary artery ([place]) 07/12/2018   Status post transmetatarsal amputation of foot, right ([place]) 07/08/2018   Limited mobility 05/12/2018   Gingival foreign body 07/22/2017   Anxiety state 06/02/2017   Leg swelling 01/29/2017   Idiopathic chronic venous hypertension of both lower extremities with inflammation 09/24/2016   Testicular mass 04/18/2016   Subacute osteomyelitis, right ankle and foot ([place]) 01/29/2016   Diabetic polyneuropathy associated with type 2 diabetes mellitus ([place])    Type 2 diabetes mellitus with neurologic complication, with long-term current use of insulin ([place]) 12/08/2015   Pulmonary nodule 02/01/2015   Depression 12/09/2013   Warts, genital 08/20/2013   Diabetic retinopathy ([place]) 01/28/2013   Sleep apnea 09/06/2010   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   Essential hypertension 11/09/2008   COPD, mild ([place]) 10/06/2006   SICKLE-CELL TRAIT 04/26/2006   ERECTILE DYSFUNCTION 04/26/2006   Tobacco use 04/26/2006   PAD (peripheral artery disease) ([place]) 04/26/2006   ALLERGIC RHINITIS 04/26/2006   Past Medical History:  Diagnosis Date   Allergy    Arthritis    Bronchitis    Cataract    CHF (congestive heart failure) ([place])    Chronic kidney disease    Claudication ([place])    right foot  ray resection   Colon polyps    hyperplastic   COPD (chronic obstructive pulmonary disease) ([place])    Coronary artery disease    Diabetes mellitus    type II   Genital warts    Gout    Hyperlipidemia    Hypertension    Myocardial infarction ([employer])    Osteomyelitis of third toe of right foot ([place])    Pneumonia    Status post amputation of toe of right foot ([place]) 09/24/2016    Family History  Problem Relation Age of Onset   Diabetes Mother  Stroke Mother    Heart failure Father    Heart attack Father    Diabetes Sister        multiple siblings   Diabetes Brother        muliple siblings   Heart disease Brother    Colon cancer Neg Hx    Esophageal cancer Neg Hx    Rectal cancer Neg Hx    Stomach cancer Neg Hx     Past Surgical History:  Procedure Laterality Date   AMPUTATION Right 01/31/2016   Procedure: Right 2nd Toe Amputation;  Surgeon: Newt Minion, MD;  Location: [facility];  Service: Orthopedics;  Laterality: Right;   AMPUTATION Right 07/08/2018   Procedure: RIGHT TRANSMETATARSAL AMPUTATION;  Surgeon: Newt Minion, MD;  Location: [facility];  Service: Orthopedics;  Laterality: Right;   AMPUTATION Right 01/05/2020   Procedure: RIGHT BELOW KNEE AMPUTATION;  Surgeon: Newt Minion, MD;  Location: [facility];  Service: Orthopedics;  Laterality: Right;   AMPUTATION Right 02/23/2020   Procedure: RIGHT BELOW KNEE AMPUTATION REVISION;  Surgeon: Newt Minion, MD;  Location: [facility];  Service: Orthopedics;  Laterality: Right;   CATARACT EXTRACTION     right eye   COLONOSCOPY     CORONARY STENT INTERVENTION N/A 07/12/2018   Procedure: CORONARY STENT INTERVENTION;  Surgeon: Troy Sine, MD;  Location: [facility];  Service: Cardiovascular;  Laterality: N/A;   CORONARY/GRAFT ACUTE MI REVASCULARIZATION N/A 07/12/2018   Procedure: Coronary/Graft Acute MI Revascularization;  Surgeon: Troy Sine, MD;  Location: [facility];  Service: Cardiovascular;  Laterality: N/A;   I & D  EXTREMITY  04/11/2012   Procedure: IRRIGATION AND DEBRIDEMENT EXTREMITY;  Surgeon: Wylene Simmer, MD;  Location: [facility];  Service: Orthopedics;  Laterality: Right;   LEFT HEART CATH AND CORONARY ANGIOGRAPHY N/A 07/12/2018   Procedure: LEFT HEART CATH AND CORONARY ANGIOGRAPHY;  Surgeon: Troy Sine, MD;  Location: [facility];  Service: Cardiovascular;  Laterality: N/A;   Surgery left great toe     Tear ducts bilateral eyes     TRANSMETATARSAL AMPUTATION Right 07/08/2018   Social History   Occupational History   Occupation: disabled  Tobacco Use   Smoking status: Former    Packs/day: 0.30    Years: 48.00    Total pack years: 14.40    Types: Cigars, Cigarettes    Start date: 07/02/1963    Quit date: 06/01/2019    Years since quitting: 2.6   Smokeless tobacco: Former  Scientific laboratory technician Use: Former  Substance and Sexual Activity   Alcohol use: Yes    Alcohol/week: 0.0 standard drinks of alcohol   Drug use: No   Sexual activity: Not on file

## 2022-01-23 NOTE — Telephone Encounter (Signed)
Called patient to inform that prescription had been sent in. Rx was accidentally sent to [facility]. Patient requested that this prescription be canceled and resent to [facility] on [address].   Called [facility]. Representative reports that I cannot cancel prescription over the phone. I was also unable to check the status to see if the medication has already been processed. Representative states that patient will need to call in to check the status as they can not give this information to third party.   Attempted to call patient back. Patient did not answer. LVM asking patient to return call to office.   Patient will need to call [facility] to check if medication has shipped. If not, we can fax a discontinue order to [phone]. However, if it has already processed, patient will need to wait for shipment.   Talbot Grumbling, RN

## 2022-01-25 ENCOUNTER — Ambulatory Visit (HOSPITAL_COMMUNITY)
Admission: RE | Admit: 2022-01-25 | Discharge: 2022-01-25 | Disposition: A | Discharge status: Home / Self Care | Payer: Medicare Other | Source: Ambulatory Visit | Attending: Family Medicine | Admitting: Family Medicine

## 2022-01-25 DIAGNOSIS — I2510 Atherosclerotic heart disease of native coronary artery without angina pectoris: Secondary | ICD-10-CM | POA: Insufficient documentation

## 2022-01-25 DIAGNOSIS — I700 Atherosclerosis of aorta: Secondary | ICD-10-CM | POA: Diagnosis not present

## 2022-01-25 DIAGNOSIS — Z122 Encounter for screening for malignant neoplasm of respiratory organs: Secondary | ICD-10-CM | POA: Insufficient documentation

## 2022-01-25 DIAGNOSIS — Z87891 Personal history of nicotine dependence: Secondary | ICD-10-CM | POA: Insufficient documentation

## 2022-01-25 DIAGNOSIS — J432 Centrilobular emphysema: Secondary | ICD-10-CM | POA: Insufficient documentation

## 2022-02-04 ENCOUNTER — Ambulatory Visit (HOSPITAL_BASED_OUTPATIENT_CLINIC_OR_DEPARTMENT_OTHER): Payer: Medicare Other | Admitting: Student in an Organized Health Care Education/Training Program

## 2022-02-04 ENCOUNTER — Encounter (HOSPITAL_COMMUNITY): Payer: Self-pay | Admitting: Student in an Organized Health Care Education/Training Program

## 2022-02-04 DIAGNOSIS — F320 Major depressive disorder, single episode, mild: Secondary | ICD-10-CM

## 2022-02-04 MED ORDER — ESCITALOPRAM OXALATE 10 MG PO TABS: 10.0000 mg | ORAL_TABLET | Freq: Every day | ORAL | 1 refills | Status: DC
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
## 2022-02-10 NOTE — Patient Instructions (Signed)
It was great to see you today! Thank you for choosing [facility] for your primary care. Adam Ray was seen for lower extremity swelling.  Today we addressed:  I have refilled your lasix prescription In the meantime, use compression stockings and elevate the legs.  Trying the dash diet  The DASH diet is rich in vegetables, fruits and whole grains. It includes fat-free or low-fat dairy products, fish, poultry, beans and nuts. It limits foods that are high in saturated fat, such as fatty meats and full-fat dairy products. The DASH diet also limits salt, also called sodium, to between 1,500 and 2,300 milligrams a day. Here are the number of servings to eat from each food group for two calorie levels of the DASH diet. Following are examples of what a single serving is. Recommended number of servings Food group 1,600-calorie diet 2,000-calorie diet  Grains (mainly whole grains) 6 a day 6-8 a day  Vegetables 3-4 a day 4-5 a day  Fruits 4 a day 4-5 a day  Low-fat or fat-free milk and milk products 2-3 a day 2-3 a day  Lean meats, poultry and fish 3-4 one-ounce servings or fewer a day 6 one-ounce servings or fewer a day  Nuts, seeds and legumes 3-4 a week 4-5 a week  Fats and oils 2 a day 2-3 a day  Sweets and added sugars 3 or fewer a week 5 or fewer a week  Source: National Heart, Lung, and [place]   If you haven't already, sign up for My Chart to have easy access to your labs results, and communication with your primary care physician.  We are checking some labs today. If they are abnormal, I will call you. If they are normal, I will send you a MyChart message (if it is active) or a letter in the mail. If you do not hear about your labs in the next 2 weeks, please call the office.   You should return to our clinic No follow-ups on file.  I recommend that you always bring your medications to each appointment as this makes it easy to ensure you are on the correct medications  and helps [place] not miss refills when you need them.  Please arrive 15 minutes before your appointment to ensure smooth check in process.  We appreciate your efforts in making this happen.  Please call the clinic at [phone] if your symptoms worsen or you have any concerns.  Thank you for allowing me to participate in your care, Gerrit Heck, MD 02/10/2022, 10:09 PM PGY-2, [place]

## 2022-02-10 NOTE — Progress Notes (Unsigned)
    SUBJECTIVE:   CHIEF COMPLAINT / HPI: Chronic left leg swelling  Patient says for the past 9 months has been having worsening left leg swelling. Denies any shortness of breath. Denies any leg pain. Right leg is amputated. Says he would like to solve his left leg edema. He says he has been out of lasix for a while. Has been using compression wrapping.  PERTINENT  PMH / PSH: chronic left leg edema, right leg amputated  OBJECTIVE:   BP 119/65   Pulse 64   Ht '5\' 10"'$  (1.778 m)   Wt 174 lb 9.6 oz (79.2 kg)   SpO2 97%   BMI 25.05 kg/m   General: NAD, awake, alert, responsive to questions Head: Normocephalic atraumatic CV: Regular rate and rhythm no murmurs rubs or gallops Respiratory: no increased work of breathing Extremities: prosthetic on right side, left leg with edema to knee, no tenderness to palpation, wrapped in compression stocking, good cap refill/pulses on exam of left leg  ASSESSMENT/PLAN:   Leg swelling The most likely diagnosis is venous insufficiency given no pain/tenderness and compression helping. I have considered and concluded the patient has a very low likelihood of DVT given no pain. Patient is breathing well on RA and is hemodynamically stable. Encouraged using compression stockings and elevating legs with utilization of DASH diet if able, see AVS for more.  -refilled lasix 20 mg   Gerrit Heck, MD [place]

## 2022-02-11 ENCOUNTER — Ambulatory Visit (INDEPENDENT_AMBULATORY_CARE_PROVIDER_SITE_OTHER): Payer: No Typology Code available for payment source | Admitting: Student

## 2022-02-11 VITALS — BP 119/65 | HR 64 | Ht 70.0 in | Wt 174.6 lb

## 2022-02-11 DIAGNOSIS — M7989 Other specified soft tissue disorders: Secondary | ICD-10-CM | POA: Diagnosis not present

## 2022-02-11 MED ORDER — FUROSEMIDE 20 MG PO TABS: 20.0000 mg | ORAL_TABLET | Freq: Every day | ORAL | 3 refills | Status: DC

## 2022-02-12 NOTE — Assessment & Plan Note (Signed)
The most likely diagnosis is venous insufficiency given no pain/tenderness and compression helping. I have considered and concluded the patient has a very low likelihood of DVT given no pain. Patient is breathing well on RA and is hemodynamically stable. Encouraged using compression stockings and elevating legs with utilization of DASH diet if able, see AVS for more.  -refilled lasix 20 mg

## 2022-02-13 ENCOUNTER — Ambulatory Visit (INDEPENDENT_AMBULATORY_CARE_PROVIDER_SITE_OTHER): Payer: Medicare Other | Admitting: Podiatry

## 2022-02-13 ENCOUNTER — Encounter: Payer: Self-pay | Admitting: Podiatry

## 2022-02-13 ENCOUNTER — Ambulatory Visit: Payer: Medicare Other | Admitting: Podiatry

## 2022-02-13 DIAGNOSIS — M79675 Pain in left toe(s): Secondary | ICD-10-CM | POA: Diagnosis not present

## 2022-02-13 DIAGNOSIS — E1151 Type 2 diabetes mellitus with diabetic peripheral angiopathy without gangrene: Secondary | ICD-10-CM

## 2022-02-13 DIAGNOSIS — B351 Tinea unguium: Secondary | ICD-10-CM | POA: Diagnosis not present

## 2022-02-13 NOTE — Progress Notes (Signed)
This patient returns to my office for at risk foot care.  This patient requires this care by a professional since this patient will be at risk due to having diabetic neuropathy and PAD.    This patient is unable to cut nails himself since the patient cannot reach his nails.These nails are painful walking and wearing shoes. He presents to the office with his wife. This patient presents for at risk foot care today.  General Appearance  Alert, conversant and in no acute stress.  Vascular  Dorsalis pedis and posterior tibial  pulses are  not palpable  Left..  Capillary return is within normal limits . Cold feet  left. bilaterally.  Neurologic  Senn-Weinstein monofilament wire test diminished  left. Muscle power within normal limits left.  Nails Thick disfigured discolored nails with subungual debris  from hallux to fifth toes left. No evidence of bacterial infection or drainage left.  Orthopedic  No limitations of motion  feet .  No crepitus or effusions noted.  No bony pathology or digital deformities noted. BK amputation right.  HAV  left.  Hammer toes 2-5 left foot..  Prominent metatarsal left.  Skin  normotropic skin with no porokeratosis noted .  No signs of infections or ulcers noted.     Onychomycosis   Pain in left toes  Consent was obtained for treatment procedures.   Mechanical debridement of nails 1-5  left performed with a nail nipper.  Filed with dremel without incident. Patient presented to the officwe wearing an  Unna boot on his left leg which was removed by the CMA.  This was reapplied following his nail treatment.  Return office visit   3 months                  Told patient to return for periodic foot care and evaluation due to potential at risk complications.   Gardiner Barefoot DPM

## 2022-02-14 ENCOUNTER — Ambulatory Visit (INDEPENDENT_AMBULATORY_CARE_PROVIDER_SITE_OTHER): Payer: Medicare Other | Admitting: Family Medicine

## 2022-02-14 ENCOUNTER — Other Ambulatory Visit: Payer: Self-pay

## 2022-02-14 ENCOUNTER — Encounter: Payer: Self-pay | Admitting: Pharmacist

## 2022-02-14 ENCOUNTER — Encounter: Payer: Self-pay | Admitting: Family Medicine

## 2022-02-14 ENCOUNTER — Ambulatory Visit (INDEPENDENT_AMBULATORY_CARE_PROVIDER_SITE_OTHER): Payer: No Typology Code available for payment source | Admitting: Pharmacist

## 2022-02-14 VITALS — BP 116/70 | HR 74 | Wt 177.2 lb

## 2022-02-14 VITALS — Wt 175.8 lb

## 2022-02-14 DIAGNOSIS — K148 Other diseases of tongue: Secondary | ICD-10-CM | POA: Insufficient documentation

## 2022-02-14 DIAGNOSIS — E1141 Type 2 diabetes mellitus with diabetic mononeuropathy: Secondary | ICD-10-CM

## 2022-02-14 DIAGNOSIS — Z794 Long term (current) use of insulin: Secondary | ICD-10-CM

## 2022-02-14 DIAGNOSIS — M7989 Other specified soft tissue disorders: Secondary | ICD-10-CM

## 2022-02-14 MED ORDER — SHINGRIX 50 MCG/0.5ML IM SUSR: INTRAMUSCULAR | 1 refills | Status: DC

## 2022-02-14 MED ORDER — TRULICITY 1.5 MG/0.5ML ~~LOC~~ SOAJ: 1.5000 mg | SUBCUTANEOUS | 3 refills | Status: DC

## 2022-02-14 NOTE — Patient Instructions (Signed)
It was nice to see you today!  Your goal blood sugar is 80-130 before eating and less than 180 after eating.  Medication Changes: Reduce Novolog (meal time insulin) to 30 units three times daily before meals Increase Trulicity to 1.5 mg weekly   Monitor blood sugars at home and keep a log (glucometer or piece of paper) to bring with you to your next visit.  Keep up the good work with diet and exercise. Aim for a diet full of vegetables, fruit and lean meats (chicken, [place], fish). Try to limit salt intake by eating fresh or frozen vegetables (instead of canned), rinse canned vegetables prior to cooking and do not add any additional salt to meals.

## 2022-02-14 NOTE — Assessment & Plan Note (Signed)
Reviewed information on hairy tongue visible in Care Everywhere, from virtual ENT consult. Discussed management with patient - gentle brushing of tongue, eating normal diet, staying hydrated. Patient agreeable and will do this.

## 2022-02-14 NOTE — Progress Notes (Signed)
  Date of Visit: 02/14/2022   SUBJECTIVE:   HPI:  Adam Ray presents today to discuss leg swelling.  Leg swelling - reports chronic swelling of LLE. He came for a visit 4 days ago and saw Dr. Jinny Ray, got rx for lasix '20mg'$  daily which he has not yet picked up. Additionally he saw podiatry yesterday and had a compression dressing applied to his entire L leg and foot. Notably at that visit decreased pedal pulses were found.  Tongue issue - has white material on his tongue, present for years. Does not cause pain or other issues, he just dislikes it. 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  OBJECTIVE:   BP 116/70   Pulse 74   Wt 177 lb 3.2 oz (80.4 kg)   SpO2 97%   BMI 25.43 kg/m  Gen: no acute distress, pleasant, cooperative HEENT: normocephalic, atraumatic. Mild white material on papillae of tongue Heart: regular rate and rhythm, no murmur Lungs: clear to auscultation bilaterally, normal work of breathing  Neuro: alert, speech normal Ext: LLE with swelling palpable above compression wrap. Wrap is present over entire left foot up to just below the knee, with toes sticking out of wrap. Did not unwrap since was placed at podiatry office yesterday.     ASSESSMENT/PLAN:   Health maintenance:  -patient to return for flu shot when available -given rx for shingrix vaccine to get at his pharmacy  Leg swelling Did not unwrap leg today since patient saw podiatry yesterday and had this compression dressing placed I am concerned about him having reported history of PAD, prior amputation on the R, and decreased pedal pulses noted at his podiatry office yesterday Have ordered & scheduled ABI of L leg to evaluate further. Patient agreeable with this plan In the meantime continue compressive dressing as per podiatry, also  encouraged to pick up the lasix prescribed this week by Dr. Jinny Ray. Follow up with me in 1 month   Tongue discoloration Reviewed information on hairy tongue visible in Care Everywhere, from virtual ENT consult. Discussed management with patient - gentle brushing of tongue, eating normal diet, staying hydrated. Patient agreeable and will do this.  Patient with multiple medications on medication list, gets care at [facility] and here at our facility. He did not bring his medications with him today so we were unable to perform medication reconciliation. He was agreeable to returning for a pharmacy clinic visit here with all his medications for medication reconciliation and adjustment of diabetes medications. Dr. Valentina Ray had an opening at 1:30pm today so I scheduled him there. Appreciate pharmacy team's assistance.  FOLLOW UP: Follow up in 1 month with me for chronic medical issues  Adam Ray, [place]

## 2022-02-14 NOTE — Patient Instructions (Signed)
It was great to see you again today!  Pick up the lasix and start taking it  Get all your medications including insulin and bring them back with you to the appointment with Dr. Valentina Lucks at 1:30p today.  Stay hydrated, gently brush your tongue  See shingles vaccine prescription  Scheduling test of blood flow to your leg  Follow up with me in 1 month   Be well, Dr. [name]

## 2022-02-14 NOTE — Progress Notes (Signed)
    S:    Chief Complaint  Patient presents with   Medication Management    Diabetes, medication review   Adam Ray is a 66 y.o. male who presents for diabetes evaluation, education, and management.  PMH is significant for T2DM, COPD, HTN, and HLD. Patient was referred and last seen by Primary Care Provider, Dr. [name], on 02/14/2022. At last visit, there were no changes made to his diabetes regimen.  Today, patient arrives in good spirits with his wife and presents without any assistance. Patient states he has been on Trulicity 3.97 for many years and has tolerated it well. He reports he checks his blood sugar every day; however, he did not bring his meter to his visit today.   Current diabetes medications include: Lantus (insulin glargine) 30 units BID, Novolog (insuiln aspart) 40 units TID  Patient reports adherence to taking all medications as prescribed.   Insurance coverage: [employer] Medicare, [place]   Patient denies hypoglycemic events.  Reported home fasting blood sugars: mostly 140-160s   Patient reported dietary habits: Eats 3 meals/day Breakfast: Sparrow bowl, broccoli, cauliflower Lunch: broccoli, carrots  Dinner: cauliflower, salad with baloney Drinks: diet coke, diet Dr. Malachi Bonds, water   O:  Review of Systems  All other systems reviewed and are negative.   Physical Exam Constitutional:      Appearance: Normal appearance.  Pulmonary:     Effort: Pulmonary effort is normal.     Breath sounds: Normal breath sounds.  Neurological:     Mental Status: He is alert.  Psychiatric:        Mood and Affect: Mood normal.        Behavior: Behavior normal.    Lab Results  Component Value Date   HGBA1C 9.4 (H) 01/17/2022   Lipid Panel     Component Value Date/Time   CHOL 97 (L) 01/17/2022 1514   TRIG 299 (H) 01/17/2022 1514   HDL 40 01/17/2022 1514   CHOLHDL 2.4 01/17/2022 1514   CHOLHDL 2.4 10/18/2018 1612   VLDL 44 (H) 10/18/2018 1612   LDLCALC 14 01/17/2022 1514    LDLDIRECT 78 09/11/2011 1503    A/P: Diabetes longstanding currently uncontrolled based on A1c. Patient is able to verbalize appropriate hypoglycemia management plan. Medication adherence appears ok. Control is suboptimal due to unbalanced insulin regimen and patient needing medication titration. -Continued basal insulin Lantus (insulin glargine) 30 units BID. -Decreased dose of rapid insulin Novolog (insulin aspart) from 40 units TID to 30 units TID.  -Increased dose of GLP-1 Trulicity (dulaglutide) to 1.5 mg weekly.  -Continued metformin 500 mg BID  -Extensively discussed pathophysiology of diabetes, recommended lifestyle interventions, dietary effects on blood sugar control.  -Counseled on s/sx of and management of hypoglycemia.  -Next A1c anticipated 2 months.   Written patient instructions provided. Patient verbalized understanding of treatment plan.  Total time in face to face counseling 37 minutes.    Follow-up:  Pharmacist PRN. PCP clinic visit in 1 month.  Patient seen with Martina Sinner, PharmD Candidate, Titus Dubin, PharmD PGY-1 Resident, and Joseph Art, PharmD, PGY2 Pharmacy Resident.  [name]

## 2022-02-14 NOTE — Assessment & Plan Note (Signed)
Diabetes longstanding currently uncontrolled based on A1c. Patient is able to verbalize appropriate hypoglycemia management plan. Medication adherence appears ok. Control is suboptimal due to unbalanced insulin regimen and patient needing medication titration. -Continued basal insulin Lantus (insulin glargine) 30 units BID. -Decreased dose of rapid insulin Novolog (insulin aspart) from 40 units TID to 30 units TID.  -Increased dose of GLP-1 Trulicity (dulaglutide) to 1.5 mg weekly.  -Continued metformin 500 mg BID  -Extensively discussed pathophysiology of diabetes, recommended lifestyle interventions, dietary effects on blood sugar control.  -Counseled on s/sx of and management of hypoglycemia.  -Next A1c anticipated 2 months.

## 2022-02-14 NOTE — Assessment & Plan Note (Signed)
Did not unwrap leg today since patient saw podiatry yesterday and had this compression dressing placed I am concerned about him having reported history of PAD, prior amputation on the R, and decreased pedal pulses noted at his podiatry office yesterday Have ordered & scheduled ABI of L leg to evaluate further. Patient agreeable with this plan In the meantime continue compressive dressing as per podiatry, also encouraged to pick up the lasix prescribed this week by Dr. Jinny Sanders. Follow up with me in 1 month

## 2022-02-14 NOTE — Progress Notes (Signed)
Reviewed: I agree with Dr. Koval's documentation and management. 

## 2022-02-18 ENCOUNTER — Ambulatory Visit (INDEPENDENT_AMBULATORY_CARE_PROVIDER_SITE_OTHER): Payer: Medicare Other | Admitting: Podiatry

## 2022-02-18 ENCOUNTER — Ambulatory Visit (HOSPITAL_COMMUNITY)
Admission: RE | Admit: 2022-02-18 | Discharge: 2022-02-18 | Disposition: A | Discharge status: Home / Self Care | Payer: Medicare Other | Source: Ambulatory Visit | Attending: Family Medicine | Admitting: Family Medicine

## 2022-02-18 DIAGNOSIS — R600 Localized edema: Secondary | ICD-10-CM

## 2022-02-18 DIAGNOSIS — M7989 Other specified soft tissue disorders: Secondary | ICD-10-CM | POA: Insufficient documentation

## 2022-02-18 NOTE — Progress Notes (Signed)
ABI's have been completed. Preliminary results can be found in CV Proc through chart review.   02/18/22 10:36 AM Adam Ray RVT

## 2022-02-20 NOTE — Progress Notes (Signed)
Subjective:   Patient ID: Adam Ray, male   DOB: 66 y.o.   MRN: [id number]   HPI Patient presents with caregiver with having had an Unna boot applied which was beneficial to reduce his swelling but had to be taken off when he had to go to the hospital today and they sent him over for reapplication of Unna boot   ROS      Objective:  Physical Exam  Patient does have swelling of his foot and leg left localized that has is not currently creating ulceration with circulatory status that is reduced currently and high risk type patient especially with swelling condition     Assessment:  Chronic swelling that is creating pressure with patient who is in poor health     Plan:  Reviewed Risk manager boot applied from the foot to the mid lower leg instructed on elevation and if it should treat any compression issues or any pain he is to just take off the entire [employer] and I explained this to his caregiver.  This can be done back at home with his nurse at his home visits as its been done previously and patient is encouraged to call with any questions concerns which arise

## 2022-02-22 ENCOUNTER — Telehealth: Payer: Self-pay | Admitting: Family Medicine

## 2022-02-22 NOTE — Telephone Encounter (Signed)
Attempted to reach patient by phone to discuss ABI result.  TBI result is decreased - indicative of likely PAD, recommend he see a vascular surgeon for further evaluation.  No answer on any of his 3 listed numbers, left hipaa-compliant VMs on the two lines that accepted voicemails [phone] and [phone]) asking him to call back.  When patient returns call please let me know so I can talk with him about these results.  [name], MD

## 2022-02-26 ENCOUNTER — Ambulatory Visit
Payer: No Typology Code available for payment source | Attending: Cardiovascular Disease | Admitting: Cardiovascular Disease

## 2022-02-26 ENCOUNTER — Encounter: Payer: Self-pay | Admitting: Cardiovascular Disease

## 2022-02-26 DIAGNOSIS — Z720 Tobacco use: Secondary | ICD-10-CM | POA: Diagnosis not present

## 2022-02-26 DIAGNOSIS — I10 Essential (primary) hypertension: Secondary | ICD-10-CM

## 2022-02-26 DIAGNOSIS — M7989 Other specified soft tissue disorders: Secondary | ICD-10-CM

## 2022-02-26 DIAGNOSIS — I739 Peripheral vascular disease, unspecified: Secondary | ICD-10-CM | POA: Diagnosis not present

## 2022-02-26 DIAGNOSIS — I2121 ST elevation (STEMI) myocardial infarction involving left circumflex coronary artery: Secondary | ICD-10-CM

## 2022-02-26 MED ORDER — FUROSEMIDE 20 MG PO TABS: 20.0000 mg | ORAL_TABLET | Freq: Two times a day (BID) | ORAL | 3 refills | Status: DC

## 2022-02-26 NOTE — Patient Instructions (Signed)
Medication Instructions:   -Increase furosemide (lasix) to '20mg'$  twice daily.  *If you need a refill on your cardiac medications before your next appointment, please call your pharmacy*   Lab Work: Your physician recommends that you return for lab work in: 7-10 days for BMET   If you have labs (blood work) drawn today and your tests are completely normal, you will receive your results only by: [place] (if you have MyChart) OR A paper copy in the mail If you have any lab test that is abnormal or we need to change your treatment, we will call you to review the results.   Follow-Up: At [facility], you and your health needs are our priority.  As part of our continuing mission to provide you with exceptional heart care, we have created designated Provider Care Teams.  These Care Teams include your primary Cardiologist (physician) and Advanced Practice Providers (APPs -  Physician Assistants and Nurse Practitioners) who all work together to provide you with the care you need, when you need it.  We recommend signing up for the patient portal called "MyChart".  Sign up information is provided on this After Visit Summary.  MyChart is used to connect with patients for Virtual Visits (Telemedicine).  Patients are able to view lab/test results, encounter notes, upcoming appointments, etc.  Non-urgent messages can be sent to your provider as well.   To learn more about what you can do with MyChart, go to [email or web address].    Your next appointment:   1 month(s)  The format for your next appointment:   In Person  Provider:   Fabian Sharp, PA-C, Sande Rives, PA-C, Caron Presume, PA-C, Jory Sims, DNP, ANP, Almyra Deforest, PA-C, or Diona Browner, NP       Then, Quay Burow, MD will plan to see you again in 12 month(s).

## 2022-02-26 NOTE — Assessment & Plan Note (Signed)
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 Sharol Given 07/08/2018.  He does wear a prosthesis.  He had Doppler studies performed 02/18/2022 revealing a normal left ABI.

## 2022-02-26 NOTE — Assessment & Plan Note (Signed)
History of essential hypertension a blood pressure measured today at 100/60.  He is on metoprolol.

## 2022-02-26 NOTE — Assessment & Plan Note (Signed)
History of discontinue tobacco abuse.

## 2022-02-26 NOTE — Assessment & Plan Note (Signed)
History of CAD status post STEMI 07/12/2018.  He had cardiac catheterization performed by Dr. Claiborne Billings revealing a high-grade mid AV groove circumflex which was stented using a Synergy drug-eluting stent.  His LAD and RCA were free of significant disease.  LV function was normal.  He denies chest pain.

## 2022-02-26 NOTE — Progress Notes (Signed)
02/26/2022 Adam Ray   1956/03/22  [id number]  Primary Physician [name] Delorse Limber, MD Primary Cardiologist: Lorretta Harp MD FACP, [place], [place], [place]  HPI:  Adam Ray is a 66 y.o.  married Caucasian male father of 2 children, grandfather of 8 grandchildren who I last saw in the office 02/08/2020.  He is accompanied by his wife Adam Ray today.  He was recently discharged from [facility] after having had a "STEMI" for an occluded circumflex coronary artery on 07/12/2018.  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  His risk factors include recently discontinue tobacco abuse having smoked the majority of his life and diabetes as well as hyperlipidemia.  He does have a family history for heart disease with a father and brother both of who had myocardial infarction's.   Dr. Claiborne Billings placed a synergy drug-eluting stent in his circumflex.  The LAD and RCA were free of significant disease.  He was discharged home on aspirin and Brilinta.    Since I saw him 2 years ago in the office, he continues to do well.  He has stopped smoking.  He was initially confined to wheelchair but now walks with his prosthesis and a cane.  It sounds like he gets phantom pain in his right leg.  He does have left lower extremity swelling which is why he was referred back.  Echo performed 07/13/2018 revealed normal LV systolic function with grade 1 diastolic dysfunction.  His leg is wrapped.  He is on torsemide 20 mg a day.  Current Meds  Medication Sig   albuterol (VENTOLIN HFA) 108 (90 Base) MCG/ACT inhaler Inhale 2 puffs into the lungs every 4 (four) hours as needed for wheezing or shortness of breath.   aspirin 81 MG chewable tablet Chew 81 mg by mouth daily.   Baclofen 5 MG TABS Take 5 mg by mouth 3 (three) times daily as needed. (Patient taking differently: Take 5 mg by mouth 2 (two) times daily as needed.)   calcium carbonate (OSCAL) 1500 (600 Ca) MG TABS tablet  Take 600 mg of elemental calcium by mouth daily.   Cholecalciferol (VITAMIN D3) 50 MCG (2000 UT) TABS Take 2,000 Units by mouth daily.   CINNAMON PO Take 1 tablet by mouth daily.   Dulaglutide (TRULICITY) 1.5 IP/3.8SN SOPN Inject 1.5 mg into the skin once a week.   escitalopram (LEXAPRO) 10 MG tablet Take 1 tablet (10 mg total) by mouth daily.   famotidine (PEPCID) 20 MG tablet Take 20 mg by mouth daily.   finasteride (PROSCAR) 5 MG tablet Take 5 mg by mouth daily.   furosemide (LASIX) 20 MG tablet Take 1 tablet (20 mg total) by mouth daily.   gabapentin (NEURONTIN) 100 MG capsule Take 1 capsule (100 mg total) by mouth 2 (two) times daily.   Guaifenesin 100 MG PACK Take 1 each by mouth daily.   insulin aspart (NOVOLOG) 100 UNIT/ML injection Inject 30 Units into the skin 3 (three) times daily before meals.   insulin glargine (LANTUS) 100 UNIT/ML injection Inject 30 Units into the skin 2 (two) times daily.   metFORMIN (GLUCOPHAGE) 500 MG tablet TAKE 1 TABLET BY MOUTH 2 TIMES DAILY WITH A MEAL.   metoprolol tartrate (LOPRESSOR) 25 MG tablet Take 25 mg by mouth 2 (two) times daily.   Multiple Vitamin (MULTIVITAMIN WITH MINERALS) TABS tablet Take 1 tablet by mouth daily.   nitroGLYCERIN (NITROSTAT) 0.4 MG SL tablet Place 1  tablet (0.4 mg total) under the tongue every 5 (five) minutes x 3 doses as needed for chest pain.   rosuvastatin (CRESTOR) 40 MG tablet Take 1 tablet (40 mg total) by mouth daily at 6 PM.   sildenafil (VIAGRA) 100 MG tablet Take 100 mg by mouth daily as needed for erectile dysfunction.   tamsulosin (FLOMAX) 0.4 MG CAPS capsule Take 1 capsule (0.4 mg total) by mouth daily.   ticagrelor (BRILINTA) 90 MG TABS tablet Take 1 tablet (90 mg total) by mouth 2 (two) times daily.   tiZANidine (ZANAFLEX) 4 MG capsule Take 1 capsule (4 mg total) by mouth 3 (three) times daily.   Zoster Vaccine Adjuvanted [facility]) injection Administer Shingrix vaccination now and repeat in two months      Allergies  Allergen Reactions   Codeine Other (See Comments)    Heart attack.    Social History   Socioeconomic History   Marital status: Married    Spouse name: Not on file   Number of children: 3   Years of education: Not on file   Highest education level: Not on file  Occupational History   Occupation: disabled  Tobacco Use   Smoking status: Former    Packs/day: 0.30    Years: 48.00    Total pack years: 14.40    Types: Cigars, Cigarettes    Start date: 07/02/1963    Quit date: 06/01/2019    Years since quitting: 2.7   Smokeless tobacco: Former  Scientific laboratory technician Use: Former  Substance and Sexual Activity   Alcohol use: Yes    Alcohol/week: 0.0 standard drinks of alcohol   Drug use: No   Sexual activity: Not on file  Other Topics Concern   Not on file  Social History Narrative   Not on file   Social Determinants of Health   Financial Resource Strain: Not on file  Food Insecurity: Not on file  Transportation Needs: Not on file  Physical Activity: Not on file  Stress: Not on file  Social Connections: Not on file  Intimate Partner Violence: Not on file     Review of Systems: General: negative for chills, fever, night sweats or weight changes.  Cardiovascular: negative for chest pain, dyspnea on exertion, edema, orthopnea, palpitations, paroxysmal nocturnal dyspnea or shortness of breath Dermatological: negative for rash Respiratory: negative for cough or wheezing Urologic: negative for hematuria Abdominal: negative for nausea, vomiting, diarrhea, bright red blood per rectum, melena, or hematemesis Neurologic: negative for visual changes, syncope, or dizziness All other systems reviewed and are otherwise negative except as noted above.    Blood pressure 100/60, pulse (!) 56, height '5\' 10"'$  (1.778 m), weight 176 lb 12.8 oz (80.2 kg), SpO2 92 %.  General appearance: alert and no distress Neck: no adenopathy, no carotid bruit, no JVD, supple, symmetrical,  trachea midline, and thyroid not enlarged, symmetric, no tenderness/mass/nodules Lungs: clear to auscultation bilaterally Heart: regular rate and rhythm, S1, S2 normal, no murmur, click, rub or gallop Extremities: 2+ left lower extremity edema, right BKA Pulses: Diminished left pedal pulse Skin: Skin color, texture, turgor normal. No rashes or lesions Neurologic: Grossly normal  EKG sinus bradycardia at 56 with septal Q waves and left axis deviation.  There was poor R wave progression.  There was voltage criteria for LVH.  I personally reviewed this EKG.  ASSESSMENT AND PLAN:   Essential hypertension History of essential hypertension a blood pressure measured today at 100/60.  He is on metoprolol.  Tobacco use History of discontinue tobacco abuse.  PAD (peripheral artery disease) (HCC) History of PAD status post right lower extremity BKA by Dr. Sharol Given 07/08/2018.  He does wear a prosthesis.  He had Doppler studies performed 02/18/2022 revealing a normal left ABI.  Leg swelling Left lower extremity edema.  His leg is currently wrapped.  He is on furosemide 20 mg a day.  Echo performed 07/13/2018 revealed normal LV systolic function with grade 1 diastolic dysfunction.  We talked about salt avoidance and raising his leg.  I am going to increase his Gypsy Balsam from 20 mg once a day to twice daily and we will check a basic metabolic panel in 7 to 10 days.  STEMI involving left circumflex coronary artery (HCC) History of CAD status post STEMI 07/12/2018.  He had cardiac catheterization performed by Dr. Claiborne Billings revealing a high-grade mid AV groove circumflex which was stented using a Synergy drug-eluting stent.  His LAD and RCA were free of significant disease.  LV function was normal.  He denies chest pain.     Lorretta Harp MD FACP,FACC,FAHA, [facility] 02/26/2022 9:42 AM

## 2022-02-26 NOTE — Assessment & Plan Note (Signed)
Left lower extremity edema.  His leg is currently wrapped.  He is on furosemide 20 mg a day.  Echo performed 07/13/2018 revealed normal LV systolic function with grade 1 diastolic dysfunction.  We talked about salt avoidance and raising his leg.  I am going to increase his Gypsy Balsam from 20 mg once a day to twice daily and we will check a basic metabolic panel in 7 to 10 days.

## 2022-03-11 ENCOUNTER — Encounter (HOSPITAL_COMMUNITY): Payer: Self-pay | Admitting: Student in an Organized Health Care Education/Training Program

## 2022-03-11 ENCOUNTER — Ambulatory Visit (HOSPITAL_BASED_OUTPATIENT_CLINIC_OR_DEPARTMENT_OTHER): Payer: Medicare Other | Admitting: Student in an Organized Health Care Education/Training Program

## 2022-03-11 DIAGNOSIS — F320 Major depressive disorder, single episode, mild: Secondary | ICD-10-CM

## 2022-03-11 MED ORDER — BUPROPION HCL 75 MG PO TABS: 75.0000 mg | ORAL_TABLET | Freq: Every day | ORAL | 1 refills | Status: DC

## 2022-03-11 NOTE — Addendum Note (Signed)
Addended by: Charlette Caffey on: 03/11/2022 02:26 PM   Modules accepted: Level of Service

## 2022-03-11 NOTE — Progress Notes (Signed)
[place] MD/PA/NP OP Progress Note  03/11/2022 11:47 AM AMEDIO BOWLBY  MRN:  [id number]  Chief Complaint: No chief complaint on file.  HPI:  DEMONI GERGEN is a 66 yr old male who presents for follow up and medication management.  PPHx is significant for no past Diagnosis', Suicide Attempts, Self Injurious Behavior, or Hospitalizations.    He presents with his wife today.  He reports that he has been doing better since starting the Lexapro, however, he reports a significant side effect to the medication.  He reports sexual side effects to the medication.  His wife reports then states that this could be due to several different issues ranging from his poorly controlled diabetes to his heart issues.  Discussed that this can be a side effect of these medications which could be contributing to this effect.  He reports that he will not take this medication anymore and wants to get off of it.  Discussed tapering off of it and starting Wellbutrin which does not have any sexual side effects and he was agreeable to this.  Discussed potential risks and side effects and he was agreeable to it.  He reports no SI, HI, or AVH.  He reports sleep is doing good.  He reports appetite is doing good.  He reports no other concerns at present.  He will return in approximately 4 weeks for follow-up.   Visit Diagnosis:    ICD-10-CM   1. Current mild episode of major depressive disorder, unspecified whether recurrent (HCC)  F32.0 buPROPion (WELLBUTRIN) 75 MG tablet      Past Psychiatric History: No past Diagnosis', Suicide Attempts, Self Injurious Behavior, or Hospitalizations.  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    Past Surgical History:  Procedure Laterality Date   AMPUTATION Right 01/31/2016   Procedure: Right 2nd Toe Amputation;  Surgeon: Newt Minion, MD;  Location: [place];  Service: Orthopedics;  Laterality: Right;   AMPUTATION Right 07/08/2018   Procedure: RIGHT TRANSMETATARSAL AMPUTATION;  Surgeon: Newt Minion, MD;  Location: [place];  Service: Orthopedics;  Laterality: Right;   AMPUTATION Right 01/05/2020   Procedure: RIGHT BELOW KNEE AMPUTATION;  Surgeon: Newt Minion, MD;  Location: [place];  Service: Orthopedics;  Laterality: Right;   AMPUTATION Right 02/23/2020   Procedure: RIGHT BELOW KNEE AMPUTATION REVISION;  Surgeon: Newt Minion, MD;  Location: [place];  Service: Orthopedics;  Laterality: Right;   CATARACT EXTRACTION     right eye   COLONOSCOPY     CORONARY STENT INTERVENTION N/A 07/12/2018   Procedure: CORONARY STENT INTERVENTION;  Surgeon: Troy Sine, MD;  Location: [place] [facility];  Service: Cardiovascular;  Laterality: N/A;   CORONARY/GRAFT ACUTE MI REVASCULARIZATION N/A 07/12/2018   Procedure: Coronary/Graft  Acute MI Revascularization;  Surgeon: Troy Sine, MD;  Location: [place] [facility];  Service: Cardiovascular;  Laterality: N/A;   I & D EXTREMITY  04/11/2012   Procedure: IRRIGATION AND DEBRIDEMENT EXTREMITY;  Surgeon: Wylene Simmer, MD;  Location: [place];  Service: Orthopedics;  Laterality: Right;   LEFT HEART CATH AND CORONARY ANGIOGRAPHY N/A 07/12/2018   Procedure: LEFT HEART CATH AND CORONARY ANGIOGRAPHY;  Surgeon: Troy Sine, MD;  Location: [place] [facility];  Service: Cardiovascular;  Laterality: N/A;   Surgery left great toe     Tear ducts bilateral eyes     TRANSMETATARSAL AMPUTATION Right 07/08/2018     Family Psychiatric History: No Known Diagnosis', Substance Abuse, or Suicides.  Family History:  Family History  Problem Relation Age of Onset   Diabetes Mother    Stroke Mother    Heart failure Father    Heart attack Father    Diabetes Sister        multiple siblings   Diabetes Brother        muliple siblings   Heart disease Brother    Colon cancer Neg Hx    Esophageal cancer Neg Hx    Rectal cancer Neg Hx    Stomach cancer Neg Hx     Social History:  Social History   Socioeconomic History   Marital status: Married    Spouse name: Not on file   Number of children: 3   Years of education: Not on file   Highest education level: Not on file  Occupational History   Occupation: disabled  Tobacco Use   Smoking status: Former    Packs/day: 0.30    Years: 48.00    Total pack years: 14.40    Types: Cigars, Cigarettes    Start date: 07/02/1963    Quit date: 06/01/2019    Years since quitting: 2.7   Smokeless tobacco: Former  Scientific laboratory technician Use: Former  Substance and Sexual Activity   Alcohol use: Yes    Alcohol/week: 0.0 standard drinks of alcohol   Drug use: No   Sexual activity: Not on file  Other Topics Concern   Not on file  Social History Narrative   Not on file   Social Determinants of Health   Financial Resource Strain: Not on file  Food Insecurity: Not on file  Transportation Needs: Not on file  Physical Activity: Not on file  Stress: Not on file  Social Connections: Not on file    Allergies:  Allergies  Allergen Reactions   Codeine Other (See Comments)    Heart attack.    Metabolic Disorder Labs: Lab Results  Component Value Date   HGBA1C 9.4 (H) 01/17/2022   MPG 202.99 01/19/2020   MPG 151.33 10/18/2018   No results found for: "PROLACTIN" Lab Results  Component Value Date   CHOL 97 (L) 01/17/2022   TRIG 299 (H) 01/17/2022   HDL 40 01/17/2022   CHOLHDL 2.4 01/17/2022   VLDL 44 (H) 10/18/2018   LDLCALC 14 01/17/2022    LDLCALC 33 12/06/2020   Lab Results  Component Value Date   TSH 1.131 07/12/2018   TSH 0.297 (L) 11/19/2013    Therapeutic Level Labs: No results found for: "LITHIUM" No results found for: "VALPROATE" No results found for: "CBMZ"  Current Medications: Current Outpatient Medications  Medication Sig Dispense Refill   buPROPion (WELLBUTRIN) 75 MG tablet Take 1 tablet (75 mg total) by mouth daily. 30 tablet 1   albuterol (  VENTOLIN HFA) 108 (90 Base) MCG/ACT inhaler Inhale 2 puffs into the lungs every 4 (four) hours as needed for wheezing or shortness of breath. 18 g 2   aspirin 81 MG chewable tablet Chew 81 mg by mouth daily.     Baclofen 5 MG TABS Take 5 mg by mouth 3 (three) times daily as needed. (Patient taking differently: Take 5 mg by mouth 2 (two) times daily as needed.) 20 tablet 0   calcium carbonate (OSCAL) 1500 (600 Ca) MG TABS tablet Take 600 mg of elemental calcium by mouth daily.     Cholecalciferol (VITAMIN D3) 50 MCG (2000 UT) TABS Take 2,000 Units by mouth daily.     CINNAMON PO Take 1 tablet by mouth daily.     Dulaglutide (TRULICITY) 1.5 OH/6.0VP SOPN Inject 1.5 mg into the skin once a week. 2 mL 3   famotidine (PEPCID) 20 MG tablet Take 20 mg by mouth daily.     finasteride (PROSCAR) 5 MG tablet Take 5 mg by mouth daily.     furosemide (LASIX) 20 MG tablet Take 1 tablet (20 mg total) by mouth 2 (two) times daily. 180 tablet 3   gabapentin (NEURONTIN) 100 MG capsule Take 1 capsule (100 mg total) by mouth 2 (two) times daily. 60 capsule 2   Guaifenesin 100 MG PACK Take 1 each by mouth daily.     insulin aspart (NOVOLOG) 100 UNIT/ML injection Inject 30 Units into the skin 3 (three) times daily before meals.     insulin glargine (LANTUS) 100 UNIT/ML injection Inject 30 Units into the skin 2 (two) times daily.     metFORMIN (GLUCOPHAGE) 500 MG tablet TAKE 1 TABLET BY MOUTH 2 TIMES DAILY WITH A MEAL. 180 tablet 1   metoprolol tartrate (LOPRESSOR) 25 MG tablet Take 25 mg by  mouth 2 (two) times daily.     Multiple Vitamin (MULTIVITAMIN WITH MINERALS) TABS tablet Take 1 tablet by mouth daily.     nitroGLYCERIN (NITROSTAT) 0.4 MG SL tablet Place 1 tablet (0.4 mg total) under the tongue every 5 (five) minutes x 3 doses as needed for chest pain. 25 tablet 11   rosuvastatin (CRESTOR) 40 MG tablet Take 1 tablet (40 mg total) by mouth daily at 6 PM.     sildenafil (VIAGRA) 100 MG tablet Take 100 mg by mouth daily as needed for erectile dysfunction.     tamsulosin (FLOMAX) 0.4 MG CAPS capsule Take 1 capsule (0.4 mg total) by mouth daily. 30 capsule 3   ticagrelor (BRILINTA) 90 MG TABS tablet Take 1 tablet (90 mg total) by mouth 2 (two) times daily. 60 tablet 11   tiZANidine (ZANAFLEX) 4 MG capsule Take 1 capsule (4 mg total) by mouth 3 (three) times daily. 15 capsule 0   Zoster Vaccine Adjuvanted [facility]) injection Administer Shingrix vaccination now and repeat in two months 1 each 1   No current facility-administered medications for this visit.     Musculoskeletal: Strength & Muscle Tone: within normal limits Gait & Station: Limp due to Right Leg BKA Patient leans: N/A  Psychiatric Specialty Exam: Review of Systems  Respiratory:  Negative for cough and shortness of breath.   Cardiovascular:  Negative for chest pain.  Gastrointestinal:  Negative for abdominal pain, constipation, diarrhea, nausea and vomiting.  Neurological:  Negative for weakness and headaches.  Psychiatric/Behavioral:  Negative for agitation, dysphoric mood, self-injury, sleep disturbance and suicidal ideas. The patient is not nervous/anxious.     There were no vitals taken for  this visit.There is no height or weight on file to calculate BMI.  General Appearance: Casual and Fairly Groomed  Eye Contact:  Fair  Speech:  Clear and Coherent and Normal Rate  Volume:  Normal  Mood:   "ok"  Affect:   minimizing  Thought Process:  Coherent  Orientation:  Full (Time, Place, and Person)  Thought  Content: Logical   Suicidal Thoughts:  No  Homicidal Thoughts:  No  Memory:  Immediate;   Fair  Judgement:  Fair  Insight:  Fair  Psychomotor Activity:  Normal  Concentration:  Concentration: Fair and Attention Span: Fair  Recall:  [employer] of Knowledge: Fair  Language: Good  Akathisia:  Negative  Handed:  Right  AIMS (if indicated): not done  Assets:  Communication Skills Housing Resilience Social Support  ADL's:  Intact  Cognition: WNL  Sleep:  Good   Screenings: GAD-7    Flowsheet Row Office Visit from 06/16/2017 in [place] Office Visit from 06/02/2017 in [place]  Total GAD-7 Score 7 10      PHQ2-9    [place] Office Visit from 02/14/2022 in [place] Office Visit from 02/11/2022 in [place] Office Visit from 02/04/2022 in [facility] Office Visit from 01/17/2022 in [place] Office Visit from 12/13/2021 in [place]  PHQ-2 Total Score 0 0 0 0 0  PHQ-9 Total Score 0 0 -- 0 0      [place] ED from 08/29/2021 in [facility] ED from 12/11/2020 in [facility] ED from 10/25/2020 in [facility] [place] No Risk Error: Question 1 not populated No Risk        Assessment and Plan:  ERI PLATTEN is a 66 yr old male who presents for follow up and medication management.  PPHx is significant for no past Diagnosis', Suicide Attempts, Self Injurious Behavior, or Hospitalizations.   Trajan reports that he has been doing better but has been having Sexual Side Effects.  Due to the side effect we will begin a taper off the Lexapro and then start Wellbutrin.  He will return for follow up in approximately 4 weeks.   MDD, Recurrent, Mild  r/o PTSD: -Decrease Lexapro to 5 mg daily for 5 days then stop.   -Start Wellbutrin 100 mg daily after stopping the Lexapro.  30 tablets with 1 refill    Collaboration of Care:   Patient/Guardian was advised Release of Information must be obtained prior to any record release in order to collaborate their care with an outside provider. Patient/Guardian was advised if they have not already done so to contact the registration department to sign all necessary forms in order for [place] to release information regarding their care.   Consent: Patient/Guardian gives verbal consent for treatment and assignment of benefits for services provided during this visit. Patient/Guardian expressed understanding and agreed to proceed.    Briant Cedar, MD 03/11/2022, 11:47 AM

## 2022-03-14 ENCOUNTER — Encounter: Payer: Self-pay | Admitting: Family Medicine

## 2022-03-14 ENCOUNTER — Ambulatory Visit (INDEPENDENT_AMBULATORY_CARE_PROVIDER_SITE_OTHER): Payer: No Typology Code available for payment source | Admitting: Family Medicine

## 2022-03-14 VITALS — BP 102/66 | HR 61 | Ht 70.0 in | Wt 177.2 lb

## 2022-03-14 DIAGNOSIS — E1141 Type 2 diabetes mellitus with diabetic mononeuropathy: Secondary | ICD-10-CM

## 2022-03-14 DIAGNOSIS — I10 Essential (primary) hypertension: Secondary | ICD-10-CM

## 2022-03-14 DIAGNOSIS — Z794 Long term (current) use of insulin: Secondary | ICD-10-CM | POA: Diagnosis not present

## 2022-03-14 DIAGNOSIS — I739 Peripheral vascular disease, unspecified: Secondary | ICD-10-CM

## 2022-03-14 NOTE — Assessment & Plan Note (Signed)
Recent abnormal left TBI.  Given known PAD and history of amputation on the right, referred to vascular surgery today.  He is agreeable to this.  I will also route a copy of the ABI result to [facility] Raechel Chute.

## 2022-03-14 NOTE — Patient Instructions (Addendum)
It was great to see you again today!  Decrease lantus to 25u twice daily. Follow up on 10/2 as scheduled Checking kidney function today  Call if having sugars under 70 consistently  Leave leg unwrapped until seen by vascular specialist Will send copy of your record to Dr. Kenn File at the [facility]  Be well, Dr. [name]

## 2022-03-14 NOTE — Progress Notes (Signed)
  Date of Visit: 03/14/2022   SUBJECTIVE:   HPI:  Adam Ray presents today for follow-up.  ABI follow-up: Had ABI of the left foot showing normal ABI but decreased TBI.  I had messaged his cardiologist about this, but have not heard back about their recommendations.  Patient is agreeable to seeing a vascular specialist given that he has already had an amputation on the right side.  Of note he has had an Haematologist in place for several weeks.  He had it placed last at the podiatry office on August 21.  It has not been removed since that time.  He does not receive any form of home health care nursing as was noted in the podiatry note.  Diabetes: Currently taking Lantus 30 units twice daily, NovoLog 30 units 3 times daily with meals, as well as Trulicity.  He last saw Dr. Valentina Lucks and Trulicity was increased from 0.75 mg weekly to 1.5 mg weekly.  Patient is unsure whether he actually has gone up on the dose.  He did bring his glucometer with him.  Has had multiple sugars under 70 this week.  He does report feeling sweaty and hot when the sugars were low.  Swelling: Saw cardiology on August 29, recommended to increase Lasix to 20 mg twice daily.  They also asked him to return for lab work to recheck a basic metabolic panel.  He has not had this drawn.  He is agreeable to having it drawn today.   OBJECTIVE:   BP 102/66   Pulse 61   Ht '5\' 10"'$  (1.778 m)   Wt 177 lb 3.2 oz (80.4 kg)   SpO2 97%   BMI 25.43 kg/m  Gen: no acute distress, pleasant, cooperative HEENT: normocephalic, atraumatic  Heart: regular rate and rhythm  Lungs: normal work of breathing  Neuro: alert speech normal Ext: LLE with unna boot in place, and swelling distal to the unnaboot over left toes. Charcot deformity of L foot noted. Unnaboot removed, no skin breakdown appreciated. Foot warm/well perfused though distal pulses not palpable.  ASSESSMENT/PLAN:   PAD (peripheral artery disease) (HCC) Recent abnormal left TBI.  Given  known PAD and history of amputation on the right, referred to vascular surgery today.  He is agreeable to this.  I will also route a copy of the ABI result [facility] Raechel Chute.  Type 2 diabetes mellitus with neurologic complication, with long-term current use of insulin (HCC) Reporting low sugars, corroborated by his glucometer readings.  Decrease Lantus to 25 units twice daily.  Follow-up with me in 1 month.  Advised to call if he is persistently having sugars under 70 in the interim.  Left leg swelling Explained to patient that Unna boot should not be left on for weeks at a time.  I am concerned with using chronic compressive therapy with his known PAD.  After discussion with patient we removed the Unna boot today, and he will use basic compression stockings in the interim until he is evaluated by vascular surgery.  No skin breakdown noted on his legs or feet. Update BMP today given recent increase in Lasix.  FOLLOW UP: Follow up in 1 mo for diabetes Referring to vascular surgery  [name], [place]

## 2022-03-14 NOTE — Assessment & Plan Note (Signed)
Reporting low sugars, corroborated by his glucometer readings.  Decrease Lantus to 25 units twice daily.  Follow-up with me in 1 month.  Advised to call if he is persistently having sugars under 70 in the interim.

## 2022-03-15 ENCOUNTER — Encounter: Payer: Self-pay | Admitting: Family Medicine

## 2022-03-15 LAB — BASIC METABOLIC PANEL
BUN/Creatinine Ratio: 11 (ref 10–24)
BUN: 13 mg/dL (ref 8–27)
CO2: 27 mmol/L (ref 20–29)
Calcium: 9.5 mg/dL (ref 8.6–10.2)
Chloride: 105 mmol/L (ref 96–106)
Creatinine, Ser: 1.14 mg/dL (ref 0.76–1.27)
Glucose: 72 mg/dL (ref 70–99)
Potassium: 4.5 mmol/L (ref 3.5–5.2)
Sodium: 144 mmol/L (ref 134–144)
eGFR: 70 mL/min/{1.73_m2} (ref >59)

## 2022-03-24 NOTE — Progress Notes (Deleted)
Cardiology Office Note:    Date:  03/24/2022   ID:  Adam Ray, DOB 02-11-1956, MRN [id number]  PCP:  [name], MD  Cardiologist:  None  Electrophysiologist:  None   Referring MD: [name], MD   Chief Complaint: follow-up of lower extremity edema  History of Present Illness:    Adam Ray is a 66 y.o. male with a history of CAD with STEMI in 07/2018 s/p DES to LCX, bicuspid aortic valve, lower extremity edema, hypertension, hyperlipidemia, type 2 diabetes mellitus, osteomyelitis s/p right transmetatarsal amputation in 07/2018 and then right below knee amputation in 01/2020 who is followed by Dr. Gwenlyn Found and presents today for follow-up of lower extremity edema.  Patient was admitted in 07/2018 with an acute STEMI and was found to have 95% stenosis of proximal to mid LCX with otherwise normal coronaries. He underwent successful PCI with DES to LCX. Echo at that time showed LVEF of 65-70% with no regional wall motion abnormalities and grade 1 diastolic dysfunction. Also showed a bicuspid aortic valve but no significant AS or AI and mild MR. Patient also had a right transmetatarsal amputation in 07/2018 due to osteomyelitis. He then ultimately required a right below knee amputation in 01/2020 due to wound dehiscence with failure of conservative measures. ABI of left leg was normal in 01/2022.   Patient was recently seen by Dr. Gwenlyn Found on 02/26/2022 at which time he was having some left lower extremity edema. His Lasix was increased from once daily to twice daily.  Patient presents today for follow-up. ***  Left Lower Extremity Edema S/p Right Below Knee Amputation in 01/2020 History of osteomyelitis s/p right transmetatarsal amputation in 07/2018 and then right BKA in 01/2020 due to wound dehiscence with failure of conservative measures. ABI of left leg was normal in 01/2022. Patient was seen in the office last month with worsening lower extremity edema and Lasix was increased.   - *** - Continue Lasix '20mg'$  twice daily. - Continue elevating leg when possible and limiting sodium intake.   CAD History of STEMI in 07/2018 s/p DES to LCX. - No chest pain.  - Currently on DAPT with Aspirin and Brilinta. We are over 3 years out from PCI now so will discuss with Dr. Gwenlyn Found about stopping Brilinta.  - Continue beta-blocker and statin.  Bicuspid Aortic Valve  Noted on Echo in 07/2018. No significant aortic stenosis or insufficiency noted at that time.  - Will repeat Echo. ***  Hypertension BP well controlled. *** - Continue Lopressor '25mg'$  twice daily.  Hyperlipidemia Lipid panel in 12/2021: Total Cholesterol 97, Triglycerides 299, HDL 40, LDL 14. - Continue Crestor '40mg'$  daily.  - Bad cholesterol looks great but triglycerides elevated. Will add Vascepa 2g twice daily and then repeat lipid panel and LFTs in 3 months.  Type 2 Diabetes Mellitus  Hemoglobin A1c 9.4 in 12/2021. - On Metformin, Trulicity, and Insulin (Novolog and Lantus). - Management per PCP.  Past Medical History:  Diagnosis Date   Allergy    Arthritis    Bronchitis    Cataract    CHF (congestive heart failure) (HCC)    Chronic kidney disease    Claudication (HCC)    right foot ray resection   Colon polyps    hyperplastic   COPD (chronic obstructive pulmonary disease) (HCC)    Coronary artery disease    Diabetes mellitus    type II   Genital warts    Gout    Hyperlipidemia  Hypertension    Myocardial infarction [facility])    Osteomyelitis of third toe of right foot ([place])    Pneumonia    Status post amputation of toe of right foot ([place]) 09/24/2016    Past Surgical History:  Procedure Laterality Date   AMPUTATION Right 01/31/2016   Procedure: Right 2nd Toe Amputation;  Surgeon: Newt Minion, MD;  Location: [place];  Service: Orthopedics;  Laterality: Right;   AMPUTATION Right 07/08/2018   Procedure: RIGHT TRANSMETATARSAL AMPUTATION;  Surgeon: Newt Minion, MD;  Location: [place];  Service:  Orthopedics;  Laterality: Right;   AMPUTATION Right 01/05/2020   Procedure: RIGHT BELOW KNEE AMPUTATION;  Surgeon: Newt Minion, MD;  Location: [place];  Service: Orthopedics;  Laterality: Right;   AMPUTATION Right 02/23/2020   Procedure: RIGHT BELOW KNEE AMPUTATION REVISION;  Surgeon: Newt Minion, MD;  Location: [place];  Service: Orthopedics;  Laterality: Right;   CATARACT EXTRACTION     right eye   COLONOSCOPY     CORONARY STENT INTERVENTION N/A 07/12/2018   Procedure: CORONARY STENT INTERVENTION;  Surgeon: Troy Sine, MD;  Location: [place] [facility];  Service: Cardiovascular;  Laterality: N/A;   CORONARY/GRAFT ACUTE MI REVASCULARIZATION N/A 07/12/2018   Procedure: Coronary/Graft Acute MI Revascularization;  Surgeon: Troy Sine, MD;  Location: [place] [facility];  Service: Cardiovascular;  Laterality: N/A;   I & D EXTREMITY  04/11/2012   Procedure: IRRIGATION AND DEBRIDEMENT EXTREMITY;  Surgeon: Wylene Simmer, MD;  Location: [place];  Service: Orthopedics;  Laterality: Right;   LEFT HEART CATH AND CORONARY ANGIOGRAPHY N/A 07/12/2018   Procedure: LEFT HEART CATH AND CORONARY ANGIOGRAPHY;  Surgeon: Troy Sine, MD;  Location: [place] [facility];  Service: Cardiovascular;  Laterality: N/A;   Surgery left great toe     Tear ducts bilateral eyes     TRANSMETATARSAL AMPUTATION Right 07/08/2018    Current Medications: No outpatient medications have been marked as taking for the 03/27/22 encounter (Appointment) with Darreld Mclean, PA-C.     Allergies:   Codeine   Social History   Socioeconomic History   Marital status: Married    Spouse name: Not on file   Number of children: 3   Years of education: Not on file   Highest education level: Not on file  Occupational History   Occupation: disabled  Tobacco Use   Smoking status: Former    Packs/day: 0.30    Years: 48.00    Total pack years: 14.40    Types: Cigars, Cigarettes    Start date: 07/02/1963    Quit date: 06/01/2019     Years since quitting: 2.8   Smokeless tobacco: Former  Scientific laboratory technician Use: Former  Substance and Sexual Activity   Alcohol use: Yes    Alcohol/week: 0.0 standard drinks of alcohol   Drug use: No   Sexual activity: Not on file  Other Topics Concern   Not on file  Social History Narrative   Not on file   Social Determinants of Health   Financial Resource Strain: Not on file  Food Insecurity: Not on file  Transportation Needs: Not on file  Physical Activity: Not on file  Stress: Not on file  Social Connections: Not on file     Family History: The patient's family history includes Diabetes in his brother, mother, and sister; Heart attack in his father; Heart disease in his brother; Heart failure in his father; Stroke in his mother.  There is no history of Colon cancer, Esophageal cancer, Rectal cancer, or Stomach cancer.  ROS:    EKGs/Labs/Other Studies Reviewed:    The following studies were reviewed:  Cardiac Catheterization 07/13/2018: Prox Cx to Mid Cx lesion is 95% stenosed. Post intervention, there is a 0% residual stenosis. A stent was successfully placed.   Acute inferior ST segment elevation myocardial infarction secondary to subtotal occlusion of a codominant mid left circumflex coronary artery.   Normal LAD and RCA.   LVEDP 20 mmHg.   Successful PCI to the mid left circumflex coronary artery with ultimate insertion of a 2.25 x 24 mm Synergy DES stent postdilated to 2.4 mm with the stenosis being reduced to 0%.   Recommendation: DAPT therapy for a minimum of 12 months of aspirin/Brilinta.  Optimal blood pressure control.  Aggressive lipid intervention with target LDL less than 70.  Diagnostic Dominance: Right  Intervention   _______________  Echocardiogram 07/13/2018: Study Conclusions: - Left ventricle: The cavity size was normal. Wall thickness was    normal. Systolic function was vigorous. The estimated ejection    fraction was in the range  of 65% to 70%. Wall motion was normal;    there were no regional wall motion abnormalities. Doppler    parameters are consistent with abnormal left ventricular    relaxation (grade 1 diastolic dysfunction). The E&'e&' ratio is    >15, suggesting elevated LV filling pressure.  - Aortic valve: Bicuspid aortic valve with thickened leaflets. No    significant stenosis. There was no regurgitation.  - Mitral valve: Mildly thickened leaflets . There was mild    regurgitation.  - Left atrium: The atrium was normal in size.  - Inferior vena cava: The vessel was dilated. The respirophasic    diameter changes were blunted (< 50%), consistent with elevated    central venous pressure.   Impressions: - LVEF 65-70%, normal wall thickness, normal wall motion, grade 1    DD, elevated LV filling pressure, bicuspid aortic valve is again    noted (compared to prior echo in 2011) without stenosis or    regurgitation, mild MR, normal LA size, dilated IVC. _______________  ABI 02/18/2022: Summary: - Right: Unable to obtain ABI due to BKA.  - Left: Resting left ankle-brachial index is within normal range. The left  toe-brachial index is abnormal.   EKG:  EKG not ordered today.   Recent Labs: 01/17/2022: ALT 23; Hemoglobin 13.5; Platelets 104 03/14/2022: BUN 13; Creatinine, Ser 1.14; Potassium 4.5; Sodium 144  Recent Lipid Panel    Component Value Date/Time   CHOL 97 (L) 01/17/2022 1514   TRIG 299 (H) 01/17/2022 1514   HDL 40 01/17/2022 1514   CHOLHDL 2.4 01/17/2022 1514   CHOLHDL 2.4 10/18/2018 1612   VLDL 44 (H) 10/18/2018 1612   LDLCALC 14 01/17/2022 1514   LDLDIRECT 78 09/11/2011 1503    Physical Exam:    Vital Signs: There were no vitals taken for this visit.    Wt Readings from Last 3 Encounters:  03/14/22 177 lb 3.2 oz (80.4 kg)  02/26/22 176 lb 12.8 oz (80.2 kg)  02/14/22 175 lb 12.8 oz (79.7 kg)     General: 67 y.o. male in no acute distress. HEENT: Normocephalic and atraumatic.  Sclera clear. EOMs intact. Neck: Supple. No carotid bruits. No JVD. Heart: *** RRR. Distinct S1 and S2. No murmurs, gallops, or rubs. Radial and distal pedal pulses 2+ and equal bilaterally. Lungs: No increased work of breathing. Clear to  ausculation bilaterally. No wheezes, rhonchi, or rales.  Abdomen: Soft, non-distended, and non-tender to palpation. Bowel sounds present in all 4 quadrants.  MSK: Normal strength and tone for age. *** Extremities: No lower extremity edema.    Skin: Warm and dry. Neuro: Alert and oriented x3. No focal deficits. Psych: Normal affect. Responds appropriately.   Assessment:    No diagnosis found.  Plan:     Disposition: Follow up in ***   Medication Adjustments/Labs and Tests Ordered: Current medicines are reviewed at length with the patient today.  Concerns regarding medicines are outlined above.  No orders of the defined types were placed in this encounter.  No orders of the defined types were placed in this encounter.   There are no Patient Instructions on file for this visit.   Signed, Darreld Mclean, PA-C  03/24/2022 12:19 PM    [facility]

## 2022-03-27 ENCOUNTER — Ambulatory Visit: Payer: No Typology Code available for payment source | Attending: Student | Admitting: Student

## 2022-04-01 ENCOUNTER — Ambulatory Visit (INDEPENDENT_AMBULATORY_CARE_PROVIDER_SITE_OTHER): Payer: No Typology Code available for payment source | Admitting: Family Medicine

## 2022-04-01 ENCOUNTER — Other Ambulatory Visit: Payer: Self-pay

## 2022-04-01 ENCOUNTER — Encounter: Payer: Self-pay | Admitting: Family Medicine

## 2022-04-01 VITALS — BP 112/64 | HR 71 | Wt 178.0 lb

## 2022-04-01 DIAGNOSIS — Z794 Long term (current) use of insulin: Secondary | ICD-10-CM | POA: Diagnosis not present

## 2022-04-01 DIAGNOSIS — I739 Peripheral vascular disease, unspecified: Secondary | ICD-10-CM

## 2022-04-01 DIAGNOSIS — E1141 Type 2 diabetes mellitus with diabetic mononeuropathy: Secondary | ICD-10-CM | POA: Diagnosis not present

## 2022-04-01 DIAGNOSIS — Z23 Encounter for immunization: Secondary | ICD-10-CM

## 2022-04-01 NOTE — Progress Notes (Signed)
  Date of Visit: 04/01/2022   SUBJECTIVE:   HPI:  Adam Ray presents today for follow up.  Diabetes - currently taking lantus 25u twice daily and novolog 30u three times daily. Sugars have been in the 40s since I saw him. Did not bring meter today but is recalling numbers from memory. Highest he's seen is close to 300. He reports only eating two meals per day, and the low sugars are primarily in the mornings after he's not eaten. He's been taking the novolog 30u at night despite not eating much at that time. He is minimally symptomatic when these episodes happen.  Has upcoming appointment with vascular surgery for abnormal ABIs. He has questions about this today. Currently foot is not causing him any issues.  OBJECTIVE:   BP 112/64   Pulse 71   Wt 178 lb (80.7 kg)   SpO2 100%   BMI 25.54 kg/m  Gen: no acute distress, pleasant, cooperative HEENT: normocephalic, atraumatic  Heart: regular rate and rhythm Lungs: clear to auscultation bilaterally normal work of breathing  Neuro: alert speech normal, grossly nonfocal  ASSESSMENT/PLAN:   Health maintenance:  -encouraged to schedule second shingles vaccine and updated COVID booster at his pharmacy -flu shot given today -urine microalbumin collected tdoay  Type 2 diabetes mellitus with neurologic complication, with long-term current use of insulin (HCC) Uncontrolled by last A1c, but having low sugars still. Hypoglycemia primarily in AM, suspect the at night dose of novolog may be driving this. Plan: - stop third daily dose of novolog, just do 30u twice daily with meals - continue lantus 25u twice daily  - continue checking sugars regularly, and call if sugars continue to run low - follow up in several weeks as scheduled to continue adjusting insulin  PAD (peripheral artery disease) ([place]) Has appointment with vascular in 2 days. Answered questions for him today about PAD.  FOLLOW UP: Follow up in a few weeks for diabetes   [place]  J. [name], [place]

## 2022-04-01 NOTE — Patient Instructions (Signed)
It was great to see you again today!  Do your novolog only TWICE a day when you eat a meal.  Continue lantus  Follow up with me in a few weeks  If still having low sugars like that please call before then  See vascular specialist in 2 days  Checking urine protein level today  Flu shot today  Go get shingles shot and COVID vaccine at your pharmacy  Be well, Dr. [name]

## 2022-04-02 ENCOUNTER — Encounter: Payer: Self-pay | Admitting: Family Medicine

## 2022-04-02 ENCOUNTER — Telehealth: Payer: Self-pay

## 2022-04-02 LAB — MICROALBUMIN / CREATININE URINE RATIO
Creatinine, Urine: 37.3 mg/dL
Microalb/Creat Ratio: <8 mg/g creat (ref 0–29)
Microalbumin, Urine: <3 ug/mL

## 2022-04-02 NOTE — Assessment & Plan Note (Signed)
Uncontrolled by last A1c, but having low sugars still. Hypoglycemia primarily in AM, suspect the at night dose of novolog may be driving this. Plan: - stop third daily dose of novolog, just do 30u twice daily with meals - continue lantus 25u twice daily  - continue checking sugars regularly, and call if sugars continue to run low - follow up in several weeks as scheduled to continue adjusting insulin

## 2022-04-02 NOTE — Telephone Encounter (Signed)
[name] with Vascular Dr. Jamse Mead office calls nurse line in regards to authorization.   She reports his apt is tomorrow, however his [employer] is requiring an authorization.   [name] reports this can be done easily tomorrow and no need to reschedule his apt since [facility] is out of the office today.   She reports to contact her at [phone].   Will forward to [place].

## 2022-04-02 NOTE — Assessment & Plan Note (Signed)
Has appointment with vascular in 2 days. Answered questions for him today about PAD.

## 2022-04-03 ENCOUNTER — Ambulatory Visit (INDEPENDENT_AMBULATORY_CARE_PROVIDER_SITE_OTHER): Payer: Medicare Other | Admitting: Vascular Surgery

## 2022-04-03 ENCOUNTER — Encounter: Payer: Self-pay | Admitting: Vascular Surgery

## 2022-04-03 VITALS — BP 126/75 | HR 58 | Temp 98.0°F | Resp 20 | Ht 70.0 in | Wt 178.0 lb

## 2022-04-03 DIAGNOSIS — I739 Peripheral vascular disease, unspecified: Secondary | ICD-10-CM | POA: Diagnosis not present

## 2022-04-03 NOTE — Progress Notes (Signed)
Patient ID: Adam Ray, male   DOB: March 15, 1956, 66 y.o.   MRN: [id number]  Reason for Consult: New Patient (Initial Visit)   Referred by [name], MD  Subjective:     HPI:  Adam Ray is a 66 y.o. male without previous vascular history other than previous right lower extremity amputation by Dr. Sharol Given.  He shows up today for decreased blood flow in the left foot.  Denies any wounds.  He does have significant swelling of the left leg states this has been present for several months.  He does wear compression stocking on the left.  He denies any history of DVT.  He does walk with a prosthetic.  Past Medical History:  Diagnosis Date   Allergy    Arthritis    Bronchitis    Cataract    CHF (congestive heart failure) (HCC)    Chronic kidney disease    Claudication (HCC)    right foot ray resection   Colon polyps    hyperplastic   COPD (chronic obstructive pulmonary disease) (HCC)    Coronary artery disease    Diabetes mellitus    type II   Genital warts    Gout    Hyperlipidemia    Hypertension    Myocardial infarction ([place])    Osteomyelitis of third toe of right foot (HCC)    Pneumonia    Status post amputation of toe of right foot ([place]) 09/24/2016   Family History  Problem Relation Age of Onset   Diabetes Mother    Stroke Mother    Heart failure Father    Heart attack Father    Diabetes Sister        multiple siblings   Diabetes Brother        muliple siblings   Heart disease Brother    Colon cancer Neg Hx    Esophageal cancer Neg Hx    Rectal cancer Neg Hx    Stomach cancer Neg Hx    Past Surgical History:  Procedure Laterality Date   AMPUTATION Right 01/31/2016   Procedure: Right 2nd Toe Amputation;  Surgeon: Newt Minion, MD;  Location: [facility];  Service: Orthopedics;  Laterality: Right;   AMPUTATION Right 07/08/2018   Procedure: RIGHT TRANSMETATARSAL AMPUTATION;  Surgeon: Newt Minion, MD;  Location: [facility] [facility];  Service: Orthopedics;  Laterality:  Right;   AMPUTATION Right 01/05/2020   Procedure: RIGHT BELOW KNEE AMPUTATION;  Surgeon: Newt Minion, MD;  Location: [facility];  Service: Orthopedics;  Laterality: Right;   AMPUTATION Right 02/23/2020   Procedure: RIGHT BELOW KNEE AMPUTATION REVISION;  Surgeon: Newt Minion, MD;  Location: [facility];  Service: Orthopedics;  Laterality: Right;   CATARACT EXTRACTION     right eye   COLONOSCOPY     CORONARY STENT INTERVENTION N/A 07/12/2018   Procedure: CORONARY STENT INTERVENTION;  Surgeon: Troy Sine, MD;  Location: [facility] [place] [facility];  Service: Cardiovascular;  Laterality: N/A;   CORONARY/GRAFT ACUTE MI REVASCULARIZATION N/A 07/12/2018   Procedure: Coronary/Graft Acute MI Revascularization;  Surgeon: Troy Sine, MD;  Location: [place] [facility] [place] [facility];  Service: Cardiovascular;  Laterality: N/A;   I & D EXTREMITY  04/11/2012   Procedure: IRRIGATION AND DEBRIDEMENT EXTREMITY;  Surgeon: Wylene Simmer, MD;  Location: [facility];  Service: Orthopedics;  Laterality: Right;   LEFT HEART CATH AND CORONARY ANGIOGRAPHY N/A 07/12/2018   Procedure: LEFT HEART CATH AND CORONARY ANGIOGRAPHY;  Surgeon: Troy Sine, MD;  Location: [facility];  Service: Cardiovascular;  Laterality: N/A;   Surgery left great toe     Tear ducts bilateral eyes     TRANSMETATARSAL AMPUTATION Right 07/08/2018    Short Social History:  Social History   Tobacco Use   Smoking status: Former    Packs/day: 0.30    Years: 48.00    Total pack years: 14.40    Types: Cigars, Cigarettes    Start date: 07/02/1963    Quit date: 06/01/2019    Years since quitting: 2.8   Smokeless tobacco: Former  Substance Use Topics   Alcohol use: Yes    Alcohol/week: 0.0 standard drinks of alcohol    Allergies  Allergen Reactions   Codeine Other (See Comments)    Heart attack.    Current Outpatient Medications  Medication Sig Dispense Refill   albuterol (VENTOLIN HFA) 108 (90 Base) MCG/ACT inhaler Inhale 2 puffs into the lungs every 4  (four) hours as needed for wheezing or shortness of breath. 18 g 2   aspirin 81 MG chewable tablet Chew 81 mg by mouth daily.     Baclofen 5 MG TABS Take 5 mg by mouth 3 (three) times daily as needed. (Patient taking differently: Take 5 mg by mouth 2 (two) times daily as needed.) 20 tablet 0   calcium carbonate (OSCAL) 1500 (600 Ca) MG TABS tablet Take 600 mg of elemental calcium by mouth daily.     Cholecalciferol (VITAMIN D3) 50 MCG (2000 UT) TABS Take 2,000 Units by mouth daily.     CINNAMON PO Take 1 tablet by mouth daily.     Dulaglutide (TRULICITY) 1.5 WU/9.8JX SOPN Inject 1.5 mg into the skin once a week. 2 mL 3   famotidine (PEPCID) 20 MG tablet Take 20 mg by mouth daily.     finasteride (PROSCAR) 5 MG tablet Take 5 mg by mouth daily.     furosemide (LASIX) 20 MG tablet Take 1 tablet (20 mg total) by mouth 2 (two) times daily. 180 tablet 3   gabapentin (NEURONTIN) 100 MG capsule Take 1 capsule (100 mg total) by mouth 2 (two) times daily. 60 capsule 2   Guaifenesin 100 MG PACK Take 1 each by mouth daily.     insulin aspart (NOVOLOG) 100 UNIT/ML injection Inject 30 Units into the skin 2 (two) times daily before a meal.     insulin glargine (LANTUS) 100 UNIT/ML injection Inject 25 Units into the skin 2 (two) times daily.     metFORMIN (GLUCOPHAGE) 500 MG tablet TAKE 1 TABLET BY MOUTH 2 TIMES DAILY WITH A MEAL. 180 tablet 1   metoprolol tartrate (LOPRESSOR) 25 MG tablet Take 25 mg by mouth 2 (two) times daily.     Multiple Vitamin (MULTIVITAMIN WITH MINERALS) TABS tablet Take 1 tablet by mouth daily.     nitroGLYCERIN (NITROSTAT) 0.4 MG SL tablet Place 1 tablet (0.4 mg total) under the tongue every 5 (five) minutes x 3 doses as needed for chest pain. 25 tablet 11   rosuvastatin (CRESTOR) 40 MG tablet Take 1 tablet (40 mg total) by mouth daily at 6 PM.     sildenafil (VIAGRA) 100 MG tablet Take 100 mg by mouth daily as needed for erectile dysfunction.     tamsulosin (FLOMAX) 0.4 MG CAPS  capsule Take 1 capsule (0.4 mg total) by mouth daily. 30 capsule 3   ticagrelor (BRILINTA) 90 MG TABS tablet Take 1 tablet (90 mg total) by mouth 2 (two) times daily. [place]  tablet 11   tiZANidine (ZANAFLEX) 4 MG capsule Take 1 capsule (4 mg total) by mouth 3 (three) times daily. 15 capsule 0   Zoster Vaccine Adjuvanted [facility]) injection Administer Shingrix vaccination now and repeat in two months 1 each 1   No current facility-administered medications for this visit.    Review of Systems  Constitutional:  Constitutional negative. HENT: HENT negative.  Eyes: Eyes negative.  Cardiovascular: Positive for leg swelling.  GI: Gastrointestinal negative.  Musculoskeletal: Positive for leg pain.  Skin: Skin negative.  Neurological: Neurological negative. Hematologic: Hematologic/lymphatic negative.  Psychiatric: Psychiatric negative.        Objective:  Objective   Vitals:   04/03/22 1457  BP: 126/75  Pulse: (!) 58  Resp: 20  Temp: 98 F (36.7 C)  SpO2: 94%  Weight: 178 lb (80.7 kg)  Height: '5\' 10"'$  (1.778 m)   Body mass index is 25.54 kg/m.  Physical Exam HENT:     Head: Normocephalic.     Nose: Nose normal.  Eyes:     Pupils: Pupils are equal, round, and reactive to light.  Cardiovascular:     Pulses:          Femoral pulses are 2+ on the left side.      Popliteal pulses are 1+ on the left side.     Comments: Left DP and PT unable to palpate possibly secondary to oedema Musculoskeletal:     Left lower leg: Edema present.     Comments: Right lower extremity prosthetic in place  Skin:    General: Skin is warm.     Capillary Refill: Capillary refill takes less than 2 seconds.  Neurological:     General: No focal deficit present.     Mental Status: He is alert.  Psychiatric:        Mood and Affect: Mood normal.        Thought Content: Thought content normal.      Data: ABI Findings:  +--------+------------------+-----+---------+--------+  Right   Rt Pressure  (mmHg)IndexWaveform Comment   +--------+------------------+-----+---------+--------+  [id number]                    triphasic          +--------+------------------+-----+---------+--------+  PTA                                     BKA       +--------+------------------+-----+---------+--------+  DP                                      BKA       +--------+------------------+-----+---------+--------+   +---------+------------------+-----+-----------+-------+  Left     Lt Pressure (mmHg)IndexWaveform   Comment  +---------+------------------+-----+-----------+-------+  Brachial 117                    triphasic           +---------+------------------+-----+-----------+-------+  PTA      129               1.02 multiphasic         +---------+------------------+-----+-----------+-------+  DP       136               1.07 multiphasic         +---------+------------------+-----+-----------+-------+  [place] Mates  0.50                     +---------+------------------+-----+-----------+-------+   +-------+-----------+-----------+------------+------------+  ABI/TBIToday's ABIToday's TBIPrevious ABIPrevious TBI  +-------+-----------+-----------+------------+------------+  Left   1.07       0.5                                  +-------+-----------+-----------+------------+------------+           Summary:  Right:   Unable to obtain ABI due to BKA.  Left: Resting left ankle-brachial index is within normal range. The left  toe-brachial index is abnormal.      Assessment/Plan:     A 6 mm male with a history of right lower extremity amputation now with left lower extremity swelling and pain in the leg wearing compression socks.  ABIs are preserved with toe pressure greater than 60 does not have any wounds or evidence of ischemic rest pain at this time.  Plan will be for venous reflux study in 3 months.  He is wearing  compression socks that are thigh-high he will continue this and I have also recommended protecting his foot with a shoe at all times given his risk for wounds with his edema.  He demonstrates good understanding we will follow-up in 3 months.     Waynetta Sandy MD [facility]

## 2022-04-04 ENCOUNTER — Other Ambulatory Visit: Payer: Self-pay

## 2022-04-04 DIAGNOSIS — M7989 Other specified soft tissue disorders: Secondary | ICD-10-CM

## 2022-04-05 NOTE — Telephone Encounter (Signed)
Attempted to call [name] back but their office was closed.  Unsure what she is referring to that he needs a PA for.  I haven't had to reach out to his [place] insurance for other appointments. Will have to speak with her before moving forward.  Mylie Mccurley,CMA

## 2022-04-08 NOTE — Telephone Encounter (Signed)
Referral form completed and placed in Dr. Lennie Odor box to sign. It will then be faxed to [phone].  Adam Ray,CMA

## 2022-04-08 NOTE — Telephone Encounter (Signed)
Routing to Dr. Thompson Grayer as I am out of the office this week and she is covering for me.  [name], MD

## 2022-04-10 NOTE — Telephone Encounter (Signed)
Request for referral authorization faxed.  Raven Harmes,CMA

## 2022-04-15 ENCOUNTER — Telehealth: Payer: Self-pay

## 2022-04-15 ENCOUNTER — Ambulatory Visit (HOSPITAL_COMMUNITY): Payer: Medicare Other | Admitting: Student in an Organized Health Care Education/Training Program

## 2022-04-15 NOTE — Telephone Encounter (Signed)
Pt called requesting an appt. Pt was just seen on 10/4 with no wounds or pain. Pt's first 3 toes on his L foot are black and his foot is painful. Pt has been wearing the compression stockings and elevating, but the swelling has worsened. Spoke to [place], [place] who advised to get pt appt scheduled asap. Confirmed understanding.

## 2022-04-17 ENCOUNTER — Encounter: Payer: Self-pay | Admitting: Physician Assistant

## 2022-04-17 ENCOUNTER — Ambulatory Visit (INDEPENDENT_AMBULATORY_CARE_PROVIDER_SITE_OTHER): Payer: No Typology Code available for payment source | Admitting: Physician Assistant

## 2022-04-17 VITALS — BP 125/69 | HR 58 | Temp 97.8°F | Ht 70.0 in | Wt 172.0 lb

## 2022-04-17 DIAGNOSIS — M7989 Other specified soft tissue disorders: Secondary | ICD-10-CM

## 2022-04-17 NOTE — Progress Notes (Signed)
[facility] [place]   Reason for referral: Swollen left leg  History of Present Illness  Adam Ray is a 66 y.o. male who presents with chief complaint: swollen leg.  He was seen by Dr. Donzetta Matters 04/03/22.  His last ABI was performed 02/18/22 and it demonstrated left LE multiphasic wave forms with toe pressure of 64.  He has history of Right  BKA by Dr. Sharol Given Patient notes, onset of swelling >7 years ago, associated with prolonged sitting and standing.  The patient has had no history of DVT.  He wears compression dai;y as well as at night.   They called and had noted dark discoloration under the toe nails 2nd and 3erd toes.  No open wounds, no claudication, or rest pain.  He does have a bunion on the left foot and keeps a band aide between the 1st and 2nd toes to protect his skin.  No wounds between the toes.  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    Past Surgical History:  Procedure Laterality Date   AMPUTATION Right 01/31/2016   Procedure: Right 2nd Toe Amputation;  Surgeon: Newt Minion, MD;  Location: [place];  Service: Orthopedics;  Laterality: Right;   AMPUTATION Right 07/08/2018   Procedure: RIGHT TRANSMETATARSAL AMPUTATION;  Surgeon: Newt Minion, MD;  Location: [facility];  Service: Orthopedics;  Laterality: Right;   AMPUTATION Right 01/05/2020   Procedure: RIGHT BELOW KNEE AMPUTATION;  Surgeon: Newt Minion, MD;  Location: [place];  Service:  Orthopedics;  Laterality: Right;   AMPUTATION Right 02/23/2020   Procedure: RIGHT BELOW KNEE AMPUTATION REVISION;  Surgeon: Newt Minion, MD;  Location: [facility];  Service: Orthopedics;  Laterality: Right;   CATARACT EXTRACTION     right eye   COLONOSCOPY     CORONARY STENT INTERVENTION N/A 07/12/2018   Procedure: CORONARY STENT INTERVENTION;  Surgeon: Troy Sine, MD;  Location: [place] [facility];  Service: Cardiovascular;  Laterality: N/A;   CORONARY/GRAFT ACUTE MI REVASCULARIZATION N/A 07/12/2018   Procedure: Coronary/Graft Acute MI Revascularization;  Surgeon: Troy Sine, MD;  Location: [place] [facility];  Service: Cardiovascular;  Laterality: N/A;   I & D EXTREMITY  04/11/2012   Procedure: IRRIGATION AND DEBRIDEMENT EXTREMITY;  Surgeon: Wylene Simmer, MD;  Location: [place];  Service: Orthopedics;  Laterality: Right;   LEFT HEART CATH AND CORONARY ANGIOGRAPHY N/A 07/12/2018   Procedure: LEFT HEART CATH AND CORONARY ANGIOGRAPHY;  Surgeon: Troy Sine, MD;  Location: [place] [facility];  Service: Cardiovascular;  Laterality: N/A;   Surgery left great toe     Tear ducts bilateral eyes     TRANSMETATARSAL AMPUTATION Right 07/08/2018    Social History   Socioeconomic History   Marital status: Married  Spouse name: Not on file   Number of children: 3   Years of education: Not on file   Highest education level: Not on file  Occupational History   Occupation: disabled  Tobacco Use   Smoking status: Former    Packs/day: 0.30    Years: 48.00    Total pack years: 14.40    Types: Cigars, Cigarettes    Start date: 07/02/1963    Quit date: 06/01/2019    Years since quitting: 2.8   Smokeless tobacco: Former  Scientific laboratory technician Use: Former  Substance and Sexual Activity   Alcohol use: Yes    Alcohol/week: 0.0 standard drinks of alcohol   Drug use: No   Sexual activity: Not on file  Other Topics Concern   Not on file  Social History Narrative   Not on file   Social  Determinants of Health   Financial Resource Strain: Not on file  Food Insecurity: Not on file  Transportation Needs: Not on file  Physical Activity: Not on file  Stress: Not on file  Social Connections: Not on file  Intimate Partner Violence: Not on file    Family History  Problem Relation Age of Onset   Diabetes Mother    Stroke Mother    Heart failure Father    Heart attack Father    Diabetes Sister        multiple siblings   Diabetes Brother        muliple siblings   Heart disease Brother    Colon cancer Neg Hx    Esophageal cancer Neg Hx    Rectal cancer Neg Hx    Stomach cancer Neg Hx     Current Outpatient Medications on File Prior to Visit  Medication Sig Dispense Refill   albuterol (VENTOLIN HFA) 108 (90 Base) MCG/ACT inhaler Inhale 2 puffs into the lungs every 4 (four) hours as needed for wheezing or shortness of breath. 18 g 2   aspirin 81 MG chewable tablet Chew 81 mg by mouth daily.     Baclofen 5 MG TABS Take 5 mg by mouth 3 (three) times daily as needed. (Patient taking differently: Take 5 mg by mouth 2 (two) times daily as needed.) 20 tablet 0   calcium carbonate (OSCAL) 1500 (600 Ca) MG TABS tablet Take 600 mg of elemental calcium by mouth daily.     Cholecalciferol (VITAMIN D3) 50 MCG (2000 UT) TABS Take 2,000 Units by mouth daily.     CINNAMON PO Take 1 tablet by mouth daily.     Dulaglutide (TRULICITY) 1.5 UU/7.2ZD SOPN Inject 1.5 mg into the skin once a week. 2 mL 3   famotidine (PEPCID) 20 MG tablet Take 20 mg by mouth daily.     finasteride (PROSCAR) 5 MG tablet Take 5 mg by mouth daily.     furosemide (LASIX) 20 MG tablet Take 1 tablet (20 mg total) by mouth 2 (two) times daily. 180 tablet 3   gabapentin (NEURONTIN) 100 MG capsule Take 1 capsule (100 mg total) by mouth 2 (two) times daily. 60 capsule 2   Guaifenesin 100 MG PACK Take 1 each by mouth daily.     insulin aspart (NOVOLOG) 100 UNIT/ML injection Inject 30 Units into the skin 2 (two) times  daily before a meal.     insulin glargine (LANTUS) 100 UNIT/ML injection Inject 25 Units into the skin 2 (two) times daily.     metFORMIN (GLUCOPHAGE) 500 MG tablet TAKE 1 TABLET BY  MOUTH 2 TIMES DAILY WITH A MEAL. 180 tablet 1   metoprolol tartrate (LOPRESSOR) 25 MG tablet Take 25 mg by mouth 2 (two) times daily.     Multiple Vitamin (MULTIVITAMIN WITH MINERALS) TABS tablet Take 1 tablet by mouth daily.     nitroGLYCERIN (NITROSTAT) 0.4 MG SL tablet Place 1 tablet (0.4 mg total) under the tongue every 5 (five) minutes x 3 doses as needed for chest pain. 25 tablet 11   rosuvastatin (CRESTOR) 40 MG tablet Take 1 tablet (40 mg total) by mouth daily at 6 PM.     sildenafil (VIAGRA) 100 MG tablet Take 100 mg by mouth daily as needed for erectile dysfunction.     tamsulosin (FLOMAX) 0.4 MG CAPS capsule Take 1 capsule (0.4 mg total) by mouth daily. 30 capsule 3   ticagrelor (BRILINTA) 90 MG TABS tablet Take 1 tablet (90 mg total) by mouth 2 (two) times daily. 60 tablet 11   tiZANidine (ZANAFLEX) 4 MG capsule Take 1 capsule (4 mg total) by mouth 3 (three) times daily. 15 capsule 0   Zoster Vaccine Adjuvanted [facility]) injection Administer Shingrix vaccination now and repeat in two months 1 each 1   [DISCONTINUED] levofloxacin (LEVAQUIN) 750 MG tablet Take 1 tablet (750 mg total) by mouth daily. For 14 days 14 tablet 0   No current facility-administered medications on file prior to visit.    Allergies as of 04/17/2022 - Review Complete 04/17/2022  Allergen Reaction Noted   Codeine Other (See Comments) 11/17/2018     ROS:   General:  No weight loss, Fever, chills  HEENT: No recent headaches, no nasal bleeding, no visual changes, no sore throat  Neurologic: No dizziness, blackouts, seizures. No recent symptoms of stroke or mini- stroke. No recent episodes of slurred speech, or temporary blindness.  Cardiac: No recent episodes of chest pain/pressure, no shortness of breath at rest.  No shortness  of breath with exertion.  Denies history of atrial fibrillation or irregular heartbeat  Vascular: No history of rest pain in feet.  No history of claudication.  No history of non-healing ulcer, No history of DVT   Pulmonary: No home oxygen, no productive cough, no hemoptysis,  No asthma or wheezing  Musculoskeletal:  [ x] Arthritis, '[ ]'$  Low back pain,  '[ ]'$  Joint pain  Hematologic:No history of hypercoagulable state.  No history of easy bleeding.  No history of anemia  Gastrointestinal: No hematochezia or melena,  No gastroesophageal reflux, no trouble swallowing  Urinary: '[ ]'$  chronic Kidney disease, '[ ]'$  on HD - '[ ]'$  MWF or '[ ]'$  TTHS, '[ ]'$  Burning with urination, '[ ]'$  Frequent urination, '[ ]'$  Difficulty urinating;   Skin: No rashes  Psychological: No history of anxiety,  No history of depression  Physical Examination  Vitals:   04/17/22 0942  BP: 125/69  Pulse: (!) 58  Temp: 97.8 F (36.6 C)  TempSrc: Temporal  SpO2: 100%  Weight: 172 lb (78 kg)  Height: '5\' 10"'$  (1.778 m)    Body mass index is 24.68 kg/m.  General:  Alert and oriented, no acute distress HEENT: Normal Neck: No bruit or JVD Pulmonary: Clear to auscultation bilaterally Cardiac: Regular Rate and Rhythm without murmur Abdomen: Soft, non-tender, non-distended, no mass, no scars Skin: No rash   Dark thickened toe nail left 2/3 erd toe nails with onychomycosis  Extremity Pulses:  Brisk multiphasic DP/PT/Peroneal signals.  Cap refill brisk all digits left LE. Musculoskeletal: No deformity or moderate edema  Left  LE chronic Neurologic: Upper and lower extremity motor 5/5 and symmetric  DATA:none  Assessment: Chronic left LE edema without varicose veins, venous wounds or history of DVT. Good inflow with brisk doppler flow left LE.  Right LE BKA by Dr. Sharol Given. No wounds or skin changes.  The toes do not appear ischemic.  He has a Podiatrist that follows him as an OP.    Plan: He will f/u in Jan for venous reflux  study and recommendations.  He will continue elevation and compression.  Roxy Horseman PA-C [facility] [place] Office: [phone]  MD in clinic [place]

## 2022-04-18 ENCOUNTER — Ambulatory Visit: Payer: No Typology Code available for payment source | Admitting: Orthopedic Surgery

## 2022-04-22 ENCOUNTER — Ambulatory Visit (HOSPITAL_COMMUNITY): Payer: Medicare Other | Admitting: Student in an Organized Health Care Education/Training Program

## 2022-04-23 ENCOUNTER — Ambulatory Visit: Payer: No Typology Code available for payment source | Attending: Student | Admitting: Nurse Practitioner

## 2022-04-23 ENCOUNTER — Encounter: Payer: Self-pay | Admitting: Nurse Practitioner

## 2022-04-23 VITALS — BP 110/62 | HR 62 | Ht 70.0 in | Wt 175.8 lb

## 2022-04-23 DIAGNOSIS — I2510 Atherosclerotic heart disease of native coronary artery without angina pectoris: Secondary | ICD-10-CM | POA: Diagnosis not present

## 2022-04-23 DIAGNOSIS — I10 Essential (primary) hypertension: Secondary | ICD-10-CM | POA: Diagnosis not present

## 2022-04-23 DIAGNOSIS — E785 Hyperlipidemia, unspecified: Secondary | ICD-10-CM

## 2022-04-23 DIAGNOSIS — I739 Peripheral vascular disease, unspecified: Secondary | ICD-10-CM

## 2022-04-23 DIAGNOSIS — M7989 Other specified soft tissue disorders: Secondary | ICD-10-CM | POA: Diagnosis not present

## 2022-04-23 DIAGNOSIS — Z720 Tobacco use: Secondary | ICD-10-CM

## 2022-04-23 DIAGNOSIS — E1141 Type 2 diabetes mellitus with diabetic mononeuropathy: Secondary | ICD-10-CM

## 2022-04-23 DIAGNOSIS — Z794 Long term (current) use of insulin: Secondary | ICD-10-CM

## 2022-04-23 MED ORDER — FUROSEMIDE 20 MG PO TABS: ORAL_TABLET | ORAL | 3 refills | Status: DC

## 2022-04-23 NOTE — Progress Notes (Signed)
Office Visit    Patient Name: Adam Ray Date of Encounter: 04/23/2022  Primary Care Provider:  [name], MD Primary Cardiologist:  Quay Burow, MD  Chief Complaint    66 year old male with a history of CAD, hypertension, hyperlipidemia, PAD, right BKA, left lower extremity edema, type 2 diabetes, and tobacco use who presents for follow-up related to lower extremity edema.  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[place]) 09/24/2016   Past Surgical History:  Procedure Laterality Date   AMPUTATION Right 01/31/2016   Procedure: Right 2nd Toe Amputation;  Surgeon: Newt Minion, MD;  Location: [facility];  Service: Orthopedics;  Laterality: Right;   AMPUTATION Right 07/08/2018   Procedure: RIGHT TRANSMETATARSAL AMPUTATION;  Surgeon: Newt Minion, MD;  Location: [facility];  Service: Orthopedics;  Laterality: Right;   AMPUTATION Right 01/05/2020   Procedure: RIGHT BELOW KNEE AMPUTATION;  Surgeon: Newt Minion, MD;  Location: [facility];  Service: Orthopedics;  Laterality: Right;   AMPUTATION Right 02/23/2020   Procedure: RIGHT BELOW KNEE AMPUTATION REVISION;  Surgeon: Newt Minion, MD;  Location: [facility];  Service: Orthopedics;  Laterality: Right;   CATARACT EXTRACTION     right eye   COLONOSCOPY     CORONARY STENT INTERVENTION N/A 07/12/2018   Procedure: CORONARY STENT INTERVENTION;  Surgeon: Troy Sine, MD;  Location: [facility];   Service: Cardiovascular;  Laterality: N/A;   CORONARY/GRAFT ACUTE MI REVASCULARIZATION N/A 07/12/2018   Procedure: Coronary/Graft Acute MI Revascularization;  Surgeon: Troy Sine, MD;  Location: [facility];  Service: Cardiovascular;  Laterality: N/A;   I & D EXTREMITY  04/11/2012   Procedure: IRRIGATION AND DEBRIDEMENT EXTREMITY;  Surgeon: Wylene Simmer, MD;  Location: [facility];  Service: Orthopedics;  Laterality: Right;   LEFT HEART CATH AND CORONARY ANGIOGRAPHY N/A 07/12/2018   Procedure: LEFT HEART CATH AND CORONARY ANGIOGRAPHY;  Surgeon: Troy Sine, MD;  Location: [facility];  Service: Cardiovascular;  Laterality: N/A;   Surgery left great toe     Tear ducts bilateral eyes     TRANSMETATARSAL AMPUTATION Right 07/08/2018    Allergies  Allergies  Allergen Reactions   Codeine Other (See Comments)    Heart attack.    History of Present Illness    66 year old male with the above past medical history including CAD, hypertension, hyperlipidemia, PAD, osteomyelitis, right BKA, left lower extremity edema, type 2 diabetes, and tobacco use.  He has a history of CAD s/p STEMI in January 2020, s/p DES-LCx, LAD and RCA were free of significant disease. He has a history of PAD and osteomyelitis s/p right transmetatarsal amputation.  He ambulates with a prosthesis.  ABIs in 01/2022 showed overall normal left ABI, left toe-brachial  index was abnormal. Most recent echocardiogram in January 2020 revealed normal LV systolic function, G1 DD. He was last seen in the office on 02/26/2022 and reported left lower extremity swelling. His furosemide was increased from 20 mg daily to 20 mg twice daily.  He presents today for follow-up accompanied by his wife.  Since his last visit he has been stable from a cardiac standpoint.  He states he has not been taking his Lasix 20 milligrams twice a day and has only been taking 20 mg daily.  He was concerned that it was making him urinate too frequently  and through the night.  He does note some improvement in his edema with elevation.  He denies chest pain, dyspnea, PND, orthopnea, weight gain. Other than his ongoing left lower extremity edema, he reports feeling well and denies any additional concerns today.  Home Medications    Current Outpatient Medications  Medication Sig Dispense Refill   aspirin 81 MG chewable tablet Chew 81 mg by mouth daily.     Baclofen 5 MG TABS Take 5 mg by mouth 3 (three) times daily as needed. 20 tablet 0   calcium carbonate (OSCAL) 1500 (600 Ca) MG TABS tablet Take 600 mg of elemental calcium by mouth daily.     Cholecalciferol (VITAMIN D3) 50 MCG (2000 UT) TABS Take 2,000 Units by mouth daily.     CINNAMON PO Take 1 tablet by mouth daily.     Dulaglutide (TRULICITY) 1.5 BP/1.0CH SOPN Inject 1.5 mg into the skin once a week. 2 mL 3   famotidine (PEPCID) 20 MG tablet Take 20 mg by mouth daily.     Guaifenesin 100 MG PACK Take 1 each by mouth daily.     insulin aspart (NOVOLOG) 100 UNIT/ML injection Inject 30 Units into the skin 2 (two) times daily before a meal.     insulin glargine (LANTUS) 100 UNIT/ML injection Inject 25 Units into the skin 2 (two) times daily.     metFORMIN (GLUCOPHAGE) 500 MG tablet TAKE 1 TABLET BY MOUTH 2 TIMES DAILY WITH A MEAL. 180 tablet 1   metoprolol tartrate (LOPRESSOR) 25 MG tablet Take 25 mg by mouth 2 (two) times daily.     Multiple Vitamin (MULTIVITAMIN WITH MINERALS) TABS tablet Take 1 tablet by mouth daily.     rosuvastatin (CRESTOR) 40 MG tablet Take 1 tablet (40 mg total) by mouth daily at 6 PM.     sildenafil (VIAGRA) 100 MG tablet Take 100 mg by mouth daily as needed for erectile dysfunction.     tamsulosin (FLOMAX) 0.4 MG CAPS capsule Take 1 capsule (0.4 mg total) by mouth daily. 30 capsule 3   tiZANidine (ZANAFLEX) 4 MG capsule Take 1 capsule (4 mg total) by mouth 3 (three) times daily. 15 capsule 0   Zoster Vaccine Adjuvanted [facility]) injection Administer Shingrix  vaccination now and repeat in two months 1 each 1   furosemide (LASIX) 20 MG tablet Take 1 tablet in the morning and 1 tablet at noon 180 tablet 3   gabapentin (NEURONTIN) 100 MG capsule Take 1 capsule (100 mg total) by mouth 2 (two) times daily. (Patient not taking: Reported on 04/23/2022) 60 capsule 2   No current facility-administered medications for this visit.     Review of Systems    He denies chest pain, palpitations, dyspnea, pnd, orthopnea, n, v, dizziness, syncope, weight gain, or early satiety. All other systems reviewed and are otherwise negative except as noted above.   Physical Exam  VS:  BP 110/62   Pulse 62   Ht '5\' 10"'$  (1.778 m)   Wt 175 lb 12.8 oz (79.7 kg)   SpO2 97%   BMI 25.22 kg/m  GEN: Well nourished, well developed, in no acute distress. HEENT: normal. Neck: Supple, no JVD, carotid bruits, or masses. Cardiac: RRR, no murmurs, rubs, or gallops. No clubbing, cyanosis, nonpitting left lower extremity edema.  Radials/DP/PT 2+ and equal bilaterally.  Respiratory:  Respirations regular and unlabored, clear to auscultation bilaterally. GI: Soft, nontender, nondistended, BS + x 4. MS: no deformity or atrophy. Skin: warm and dry, no rash. Neuro:  Strength and sensation are intact. Psych: Normal affect.  Accessory Clinical Findings    ECG personally reviewed by me today - No EKG in office today.   Lab Results  Component Value Date   WBC 3.8 01/17/2022   HGB 13.5 01/17/2022   HCT 39.6 01/17/2022   MCV 85 01/17/2022   PLT 104 (L) 01/17/2022   Lab Results  Component Value Date   CREATININE 1.14 03/14/2022   BUN 13 03/14/2022   NA 144 03/14/2022   K 4.5 03/14/2022   CL 105 03/14/2022   CO2 27 03/14/2022   Lab Results  Component Value Date   ALT 23 01/17/2022   AST 19 01/17/2022   ALKPHOS 115 01/17/2022   BILITOT 0.4 01/17/2022   Lab Results  Component Value Date   CHOL 97 (L) 01/17/2022   HDL 40 01/17/2022   LDLCALC 14 01/17/2022   LDLDIRECT  78 09/11/2011   TRIG 299 (H) 01/17/2022   CHOLHDL 2.4 01/17/2022    Lab Results  Component Value Date   HGBA1C 9.4 (H) 01/17/2022    Assessment & Plan    1. Left lower extremity edema: Improved overall though he does have ongoing nonpitting left lower extremity edema.  He denies dyspnea, PND, orthopnea, weight gain.  He has not been taking his Lasix as prescribed.  He has only been taking 20 mg daily as it was making him go to the bathroom frequently.  We discussed the importance of increased Lasix dosing in relation to managing his fluid volume status. Discussed daily weights, sodium recommendations. Continue metoprolol, Lasix.  2. CAD: S/p STEMI, DES-LCx in 2020. Stable with no anginal symptoms. No indication for ischemic evaluation.  Continue aspirin, metoprolol, Crestor.  3. Hypertension: BP well controlled. Continue current antihypertensive regimen.   4. Hyperlipidemia: LDL was 14 in 12/2021.  Continue aspirin, Crestor.  5. PAD: S/p right BKA. ABIs in 01/2022 were stable.  Does note some intermittent aching in his left toes.  It appears his PCP referred him to vascular surgery for further evaluation.  6. Type 2 diabetes: A1c was 9.4 in 12/2021.  Monitored and managed per PCP.  7. Tobacco use: He is no longer smoking.  8. Disposition: Follow-up in 3 to 5 months with Dr. Gwenlyn Found, sooner if needed.      Lenna Sciara, NP 04/23/2022, 5:15 PM

## 2022-04-23 NOTE — Patient Instructions (Addendum)
Medication Instructions:  Your physician recommends that you continue on your current medications as directed. Please refer to the Current Medication list given to you today.   *If you need a refill on your cardiac medications before your next appointment, please call your pharmacy*   Lab Work: NONE ordered at this time of appointment   If you have labs (blood work) drawn today and your tests are completely normal, you will receive your results only by: [place] (if you have MyChart) OR A paper copy in the mail If you have any lab test that is abnormal or we need to change your treatment, we will call you to review the results.   Testing/Procedures: NONE ordered at this time of appointment     Follow-Up: At [facility], you and your health needs are our priority.  As part of our continuing mission to provide you with exceptional heart care, we have created designated Provider Care Teams.  These Care Teams include your primary Cardiologist (physician) and Advanced Practice Providers (APPs -  Physician Assistants and Nurse Practitioners) who all work together to provide you with the care you need, when you need it.  We recommend signing up for the patient portal called "MyChart".  Sign up information is provided on this After Visit Summary.  MyChart is used to connect with patients for Virtual Visits (Telemedicine).  Patients are able to view lab/test results, encounter notes, upcoming appointments, etc.  Non-urgent messages can be sent to your provider as well.   To learn more about what you can do with MyChart, go to [email or web address].    Your next appointment:   3-5 month(s)  The format for your next appointment:   In Person  Provider:   Quay Burow, MD     Other Instructions Please call our office if you have excessive weight gain of 3 lb over night or 5 lb in 1 week, shortness of breath and increased swelling.   Important Information About  [place]

## 2022-04-24 ENCOUNTER — Telehealth: Payer: Self-pay | Admitting: *Deleted

## 2022-04-24 ENCOUNTER — Encounter: Payer: Self-pay | Admitting: Family

## 2022-04-24 ENCOUNTER — Ambulatory Visit (INDEPENDENT_AMBULATORY_CARE_PROVIDER_SITE_OTHER): Payer: 59 | Admitting: Family

## 2022-04-24 DIAGNOSIS — Z89511 Acquired absence of right leg below knee: Secondary | ICD-10-CM | POA: Diagnosis not present

## 2022-04-24 NOTE — Telephone Encounter (Signed)
[name] w/ [facility] is calling for a [facility] prior authorization for this patient, explained that it was the wrong office , wrong doctor, we do not have a Dr Mackey Birchwood and the doctor that patient saw in Aug. Did not order any studies. She verbalized understanding and apologized.

## 2022-04-24 NOTE — Progress Notes (Signed)
Office Visit Note   Patient: Adam Ray           Date of Birth: December 25, 1955           MRN: [id number] Visit Date: 04/24/2022              Requested by: [name], [place],  [place] [address] PCP: [name], MD  No chief complaint on file.     HPI: The patient is a 66 year old gentleman who presents today concern for ill fitting prosthesis on the right.  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  He has had many modifications to his current socket heals but he is unstable and rocking with inside the socket also having some pain with his patella against the socket.  His wife reports he is walking with an abnormal gait along the medial column of his foot valgus deformity of the knee due to ill fitting of the socket.  Assessment & Plan: Visit Diagnoses: No diagnosis found.  Plan: Order provided for new prosthesis set up.  The patient will call for an appointment order faxed today.  He will follow-up in the office as needed.  Follow-Up Instructions: No follow-ups on file.   Ortho Exam  Patient is alert, oriented, no adenopathy, well-dressed, normal affect, normal respiratory effort. On examination of the right residual limb this is well consolidated well-healed there is no callus no open ulcer  Imaging: No results found. No images are attached to the encounter.  Labs: Lab Results  Component Value Date   HGBA1C 9.4 (H) 01/17/2022   HGBA1C 7.8 (A) 09/14/2021   HGBA1C 7.0 02/27/2021   ESRSEDRATE 54 (H) 01/01/2020   ESRSEDRATE 8 01/29/2016   ESRSEDRATE 9 04/11/2012   CRP 2.6 (H) 01/29/2016   CRP 0.5 02/28/2014   REPTSTATUS 01/18/2020 FINAL 01/17/2020   REPTSTATUS 01/22/2020 FINAL 01/17/2020   REPTSTATUS 01/22/2020 FINAL 01/17/2020   GRAMSTAIN  04/11/2012    NO WBC SEEN RARE SQUAMOUS EPITHELIAL CELLS PRESENT MODERATE GRAM POSITIVE COCCI IN PAIRS RARE GRAM NEGATIVE RODS   GRAMSTAIN  04/11/2012    NO  WBC SEEN RARE SQUAMOUS EPITHELIAL CELLS PRESENT MODERATE GRAM POSITIVE COCCI IN PAIRS RARE GRAM NEGATIVE RODS   CULT (A) 01/17/2020    <10,000 COLONIES/mL INSIGNIFICANT GROWTH Performed at [facility], [address] [place], [place] [address]    CULT  01/17/2020    NO GROWTH 5 DAYS Performed at [facility] [address], [place], [place] [address]    CULT  01/17/2020    NO GROWTH 5 DAYS Performed at [facility] [address], [place], [place] [address]    LABORGA NO GROWTH 2 DAYS 07/19/2014     Lab Results  Component Value Date   ALBUMIN 4.5 01/17/2022   ALBUMIN 4.1 12/06/2020   ALBUMIN 4.3 12/01/2020    No results found for: "MG" No results found for: "VD25OH"  No results found for: "PREALBUMIN"    Latest Ref Rng & Units 01/17/2022    3:14 PM 12/06/2020    4:42 PM 12/01/2020    3:51 PM  CBC EXTENDED  WBC 3.4 - 10.8 x10E3/uL 3.8  5.5  6.3   RBC 4.14 - 5.80 x10E6/uL 4.64  4.90  4.66   Hemoglobin 13.0 - 17.7 g/dL 13.5  13.7  13.1   HCT 37.5 - 51.0 % 39.6  39.8  38.8   Platelets 150 - 450 x10E3/uL  104  138  104.0   NEUT# 1.4 - 7.7 K/uL   4.8   Lymph# 0.7 - 4.0 K/uL   0.9      There is no height or weight on file to calculate BMI.  Orders:  No orders of the defined types were placed in this encounter.  No orders of the defined types were placed in this encounter.    Procedures: No procedures performed  Clinical Data: No additional findings.  ROS:  All other systems negative, except as noted in the HPI. Review of Systems  Objective: Vital Signs: There were no vitals taken for this visit.  Specialty Comments:  No specialty comments available.  PMFS History: Patient Active Problem List   Diagnosis Date Noted   Tongue discoloration 02/14/2022   Hematuria 08/16/2021   Illness 12/06/2020   Urinary incontinence 08/10/2020   Blurred vision, bilateral 07/13/2020   Increased urinary frequency 05/09/2020   Dehiscence of  amputation stump ([place])    Coronary artery disease 01/18/2020   Nocturnal leg cramps 10/28/2019   Hyperlipidemia 05/26/2019   Edema leg 12/29/2018   STEMI involving left circumflex coronary artery ([place]) 07/12/2018   Status post transmetatarsal amputation of foot, right ([place]) 07/08/2018   Limited mobility 05/12/2018   Gingival foreign body 07/22/2017   Anxiety state 06/02/2017   Leg swelling 01/29/2017   Idiopathic chronic venous hypertension of both lower extremities with inflammation 09/24/2016   Testicular mass 04/18/2016   Subacute osteomyelitis, right ankle and foot ([place]) 01/29/2016   Diabetic polyneuropathy associated with type 2 diabetes mellitus ([place])    Type 2 diabetes mellitus with neurologic complication, with long-term current use of insulin ([place]) 12/08/2015   Pulmonary nodule 02/01/2015   Depression 12/09/2013   Warts, genital 08/20/2013   Diabetic retinopathy ([place]) 01/28/2013   Sleep apnea 09/06/2010   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   Essential hypertension 11/09/2008   COPD, mild ([place]) 10/06/2006   SICKLE-CELL TRAIT 04/26/2006   ERECTILE DYSFUNCTION 04/26/2006   Tobacco use 04/26/2006   PAD (peripheral artery disease) ([place]) 04/26/2006   ALLERGIC RHINITIS 04/26/2006   Past Medical History:  Diagnosis Date   Allergy    Arthritis    Bronchitis    Cataract    CHF (congestive heart failure) ([place])    Chronic kidney disease    Claudication ([place])    right foot ray resection   Colon polyps    hyperplastic   COPD (chronic obstructive pulmonary disease) ([place])    Coronary artery disease    Diabetes mellitus    type II   Genital warts    Gout    Hyperlipidemia    Hypertension    Myocardial infarction ([place])    Osteomyelitis of third toe of right foot ([place])    Pneumonia    Status post amputation of toe of right foot ([place]) 09/24/2016    Family History  Problem Relation Age of Onset   Diabetes Mother    Stroke Mother    Heart failure Father    Heart attack  Father    Diabetes Sister        multiple siblings   Diabetes Brother        muliple siblings   Heart disease Brother    Colon cancer Neg Hx    Esophageal cancer Neg Hx    Rectal cancer Neg Hx    Stomach cancer Neg Hx     Past Surgical History:  Procedure Laterality Date   AMPUTATION Right 01/31/2016   Procedure:  Right 2nd Toe Amputation;  Surgeon: Newt Minion, MD;  Location: [employer];  Service: Orthopedics;  Laterality: Right;   AMPUTATION Right 07/08/2018   Procedure: RIGHT TRANSMETATARSAL AMPUTATION;  Surgeon: Newt Minion, MD;  Location: [facility];  Service: Orthopedics;  Laterality: Right;   AMPUTATION Right 01/05/2020   Procedure: RIGHT BELOW KNEE AMPUTATION;  Surgeon: Newt Minion, MD;  Location: [facility];  Service: Orthopedics;  Laterality: Right;   AMPUTATION Right 02/23/2020   Procedure: RIGHT BELOW KNEE AMPUTATION REVISION;  Surgeon: Newt Minion, MD;  Location: [facility];  Service: Orthopedics;  Laterality: Right;   CATARACT EXTRACTION     right eye   COLONOSCOPY     CORONARY STENT INTERVENTION N/A 07/12/2018   Procedure: CORONARY STENT INTERVENTION;  Surgeon: Troy Sine, MD;  Location: [facility];  Service: Cardiovascular;  Laterality: N/A;   CORONARY/GRAFT ACUTE MI REVASCULARIZATION N/A 07/12/2018   Procedure: Coronary/Graft Acute MI Revascularization;  Surgeon: Troy Sine, MD;  Location: [facility];  Service: Cardiovascular;  Laterality: N/A;   I & D EXTREMITY  04/11/2012   Procedure: IRRIGATION AND DEBRIDEMENT EXTREMITY;  Surgeon: Wylene Simmer, MD;  Location: [facility];  Service: Orthopedics;  Laterality: Right;   LEFT HEART CATH AND CORONARY ANGIOGRAPHY N/A 07/12/2018   Procedure: LEFT HEART CATH AND CORONARY ANGIOGRAPHY;  Surgeon: Troy Sine, MD;  Location: [facility];  Service: Cardiovascular;  Laterality: N/A;   Surgery left great toe     Tear ducts bilateral eyes     TRANSMETATARSAL AMPUTATION Right 07/08/2018   Social History    Occupational History   Occupation: disabled  Tobacco Use   Smoking status: Former    Packs/day: 0.30    Years: 48.00    Total pack years: 14.40    Types: Cigars, Cigarettes    Start date: 07/02/1963    Quit date: 06/01/2019    Years since quitting: 2.8   Smokeless tobacco: Former  Scientific laboratory technician Use: Former  Substance and Sexual Activity   Alcohol use: Yes    Alcohol/week: 0.0 standard drinks of alcohol   Drug use: No   Sexual activity: Not on file

## 2022-04-24 NOTE — Telephone Encounter (Signed)
Per [employer] prior auth was never received by [facility] and they requested it be faxed to [email or web address].   Referral faxed.  Deran Barro,CMA

## 2022-04-25 ENCOUNTER — Ambulatory Visit: Payer: No Typology Code available for payment source | Admitting: Family Medicine

## 2022-04-29 ENCOUNTER — Ambulatory Visit: Payer: No Typology Code available for payment source | Admitting: Family Medicine

## 2022-04-30 ENCOUNTER — Ambulatory Visit (INDEPENDENT_AMBULATORY_CARE_PROVIDER_SITE_OTHER): Payer: Medicare Other | Admitting: Podiatry

## 2022-04-30 ENCOUNTER — Encounter: Payer: Self-pay | Admitting: Podiatry

## 2022-04-30 DIAGNOSIS — B351 Tinea unguium: Secondary | ICD-10-CM

## 2022-04-30 DIAGNOSIS — E1151 Type 2 diabetes mellitus with diabetic peripheral angiopathy without gangrene: Secondary | ICD-10-CM | POA: Diagnosis not present

## 2022-04-30 DIAGNOSIS — M79675 Pain in left toe(s): Secondary | ICD-10-CM

## 2022-04-30 DIAGNOSIS — I739 Peripheral vascular disease, unspecified: Secondary | ICD-10-CM

## 2022-04-30 DIAGNOSIS — S88119A Complete traumatic amputation at level between knee and ankle, unspecified lower leg, initial encounter: Secondary | ICD-10-CM

## 2022-04-30 DIAGNOSIS — L84 Corns and callosities: Secondary | ICD-10-CM

## 2022-04-30 NOTE — Progress Notes (Signed)
This patient returns to my office for at risk foot care.  This patient requires this care by a professional since this patient will be at risk due to having diabetic neuropathy and PAD.    This patient is unable to cut nails himself since the patient cannot reach his nails.These nails are painful walking and wearing shoes. He presents to the office with his wife. This patient presents for at risk foot care today.  General Appearance  Alert, conversant and in no acute stress.  Vascular  Dorsalis pedis and posterior tibial  pulses are  not palpable  Left..  Capillary return is within normal limits . Cold feet  left. bilaterally.  Neurologic  Senn-Weinstein monofilament wire test diminished  left. Muscle power within normal limits left.  Nails Thick disfigured discolored nails with subungual debris  from hallux to fifth toes left. No evidence of bacterial infection or drainage left.  Orthopedic  No limitations of motion  feet .  No crepitus or effusions noted.  No bony pathology or digital deformities noted. BK amputation right.  HAV  left.  Hammer toes 2-5 left foot..    Skin  normotropic skin with no porokeratosis noted .  No signs of infections or ulcers noted.   Localized callus with ecchymosis noted with no fluctuance 1st MPJ left foot plantarly.  Onychomycosis   Pain in left toes  Consent was obtained for treatment procedures.   Mechanical debridement of nails 1-5  left performed with a nail nipper.  Filed with dremel without incident. Patient had padding applied to his diabetic insole inside his shoe.  Return office visit   10 weeks                 Told patient to return for periodic foot care and evaluation due to potential at risk complications.   Gardiner Barefoot DPM

## 2022-05-12 ENCOUNTER — Other Ambulatory Visit: Payer: Self-pay | Admitting: Student

## 2022-05-12 DIAGNOSIS — M7989 Other specified soft tissue disorders: Secondary | ICD-10-CM

## 2022-05-15 ENCOUNTER — Ambulatory Visit: Payer: Medicare Other | Admitting: Podiatry

## 2022-07-02 NOTE — Progress Notes (Unsigned)
VASCULAR & VEIN SPECIALISTS           OF [place]  History and Physical   Adam Ray is a 67 y.o. male who presents with LLE swelling.    He was seen by Dr. Donzetta Matters on 04/03/2022.  Pt was without previous vascular history other than previous right lower extremity amputation by Dr. Sharol Given. He was evaluated for PAD, but had preserved ABI and toe pressure greater than 60 and was scheduled to return for venous duplex study.  He did not have hx of DVT.  He ambulates with a prosthesis.    He comes in today for leg swelling evaluation.  He states he wears his compression but was noted by RN when replacing that his compression has holes in it.  He gets them from the [place].  When asked about blood clots, he says that he had one in the leg that travelled to his heart.  I cannot find documentation of this.   He had normal creatinine in September.   He has hx of heart failure.  He does not get any claudication or rest pain in the left leg with walking.  He does not have any non healing wounds.    The pt is on a statin for cholesterol management.  The pt is on a daily aspirin.   Other AC:  none The pt is on BB, diuretic for hypertension.   The pt does have diabetic.   Tobacco hx:  former  Pt does not have family hx of AAA.  Past Medical History:  Diagnosis Date   Allergy    Arthritis    Bronchitis    Cataract    CHF (congestive heart failure) (HCC)    Chronic kidney disease    Claudication (HCC)    right foot ray resection   Colon polyps    hyperplastic   COPD (chronic obstructive pulmonary disease) (HCC)    Coronary artery disease    Diabetes mellitus    type II   Genital warts    Gout    Hyperlipidemia    Hypertension    Myocardial infarction ([place])    Osteomyelitis of third toe of right foot ([place])    Pneumonia    Status post amputation of toe of right foot ([place]) 09/24/2016    Past Surgical History:  Procedure Laterality Date   AMPUTATION Right 01/31/2016   Procedure:  Right 2nd Toe Amputation;  Surgeon: Newt Minion, MD;  Location: [place];  Service: Orthopedics;  Laterality: Right;   AMPUTATION Right 07/08/2018   Procedure: RIGHT TRANSMETATARSAL AMPUTATION;  Surgeon: Newt Minion, MD;  Location: [place];  Service: Orthopedics;  Laterality: Right;   AMPUTATION Right 01/05/2020   Procedure: RIGHT BELOW KNEE AMPUTATION;  Surgeon: Newt Minion, MD;  Location: [place];  Service: Orthopedics;  Laterality: Right;   AMPUTATION Right 02/23/2020   Procedure: RIGHT BELOW KNEE AMPUTATION REVISION;  Surgeon: Newt Minion, MD;  Location: [place];  Service: Orthopedics;  Laterality: Right;   CATARACT EXTRACTION     right eye   COLONOSCOPY     CORONARY STENT INTERVENTION N/A 07/12/2018   Procedure: CORONARY STENT INTERVENTION;  Surgeon: Troy Sine, MD;  Location: [place] [facility];  Service: Cardiovascular;  Laterality: N/A;   CORONARY/GRAFT ACUTE MI REVASCULARIZATION N/A 07/12/2018   Procedure: Coronary/Graft Acute MI Revascularization;  Surgeon: Troy Sine, MD;  Location: [place] [facility];  Service: Cardiovascular;  Laterality: N/A;   I & D EXTREMITY  04/11/2012   Procedure: IRRIGATION AND DEBRIDEMENT EXTREMITY;  Surgeon: Wylene Simmer, MD;  Location: [place];  Service: Orthopedics;  Laterality: Right;   LEFT HEART CATH AND CORONARY ANGIOGRAPHY N/A 07/12/2018   Procedure: LEFT HEART CATH AND CORONARY ANGIOGRAPHY;  Surgeon: Troy Sine, MD;  Location: [place] [facility];  Service: Cardiovascular;  Laterality: N/A;   Surgery left great toe     Tear ducts bilateral eyes     TRANSMETATARSAL AMPUTATION Right 07/08/2018    Social History   Socioeconomic History   Marital status: Married    Spouse name: Not on file   Number of children: 3   Years of education: Not on file   Highest education level: Not on file  Occupational History   Occupation: disabled  Tobacco Use   Smoking status: Former    Packs/day: 0.30    Years: 48.00    Total pack years: 14.40     Types: Cigars, Cigarettes    Start date: 07/02/1963    Quit date: 06/01/2019    Years since quitting: 3.0   Smokeless tobacco: Former  Scientific laboratory technician Use: Former  Substance and Sexual Activity   Alcohol use: Yes    Alcohol/week: 0.0 standard drinks of alcohol   Drug use: No   Sexual activity: Not on file  Other Topics Concern   Not on file  Social History Narrative   Not on file   Social Determinants of Health   Financial Resource Strain: Not on file  Food Insecurity: Not on file  Transportation Needs: Not on file  Physical Activity: Not on file  Stress: Not on file  Social Connections: Not on file  Intimate Partner Violence: Not on file     Family History  Problem Relation Age of Onset   Diabetes Mother    Stroke Mother    Heart failure Father    Heart attack Father    Diabetes Sister        multiple siblings   Diabetes Brother        muliple siblings   Heart disease Brother    Colon cancer Neg Hx    Esophageal cancer Neg Hx    Rectal cancer Neg Hx    Stomach cancer Neg Hx     Current Outpatient Medications  Medication Sig Dispense Refill   aspirin 81 MG chewable tablet Chew 81 mg by mouth daily.     Baclofen 5 MG TABS Take 5 mg by mouth 3 (three) times daily as needed. 20 tablet 0   calcium carbonate (OSCAL) 1500 (600 Ca) MG TABS tablet Take 600 mg of elemental calcium by mouth daily.     Cholecalciferol (VITAMIN D3) 50 MCG (2000 UT) TABS Take 2,000 Units by mouth daily.     CINNAMON PO Take 1 tablet by mouth daily.     Dulaglutide (TRULICITY) 1.5 GE/9.5MW SOPN Inject 1.5 mg into the skin once a week. 2 mL 3   famotidine (PEPCID) 20 MG tablet Take 20 mg by mouth daily.     furosemide (LASIX) 20 MG tablet TAKE 1 TABLET BY MOUTH EVERY DAY 90 tablet 1   gabapentin (NEURONTIN) 100 MG capsule Take 1 capsule (100 mg total) by mouth 2 (two) times daily. (Patient not taking: Reported on 04/23/2022) 60 capsule 2   Guaifenesin 100 MG PACK Take 1 each by mouth daily.      insulin aspart (NOVOLOG) 100 UNIT/ML injection Inject  30 Units into the skin 2 (two) times daily before a meal.     insulin glargine (LANTUS) 100 UNIT/ML injection Inject 25 Units into the skin 2 (two) times daily.     metFORMIN (GLUCOPHAGE) 500 MG tablet TAKE 1 TABLET BY MOUTH 2 TIMES DAILY WITH A MEAL. 180 tablet 1   metoprolol tartrate (LOPRESSOR) 25 MG tablet Take 25 mg by mouth 2 (two) times daily.     Multiple Vitamin (MULTIVITAMIN WITH MINERALS) TABS tablet Take 1 tablet by mouth daily.     rosuvastatin (CRESTOR) 40 MG tablet Take 1 tablet (40 mg total) by mouth daily at 6 PM.     sildenafil (VIAGRA) 100 MG tablet Take 100 mg by mouth daily as needed for erectile dysfunction.     tamsulosin (FLOMAX) 0.4 MG CAPS capsule Take 1 capsule (0.4 mg total) by mouth daily. 30 capsule 3   tiZANidine (ZANAFLEX) 4 MG capsule Take 1 capsule (4 mg total) by mouth 3 (three) times daily. 15 capsule 0   Zoster Vaccine Adjuvanted [facility]) injection Administer Shingrix vaccination now and repeat in two months 1 each 1   No current facility-administered medications for this visit.    Allergies  Allergen Reactions   Codeine Other (See Comments)    Heart attack.    REVIEW OF SYSTEMS:   '[X]'$  denotes positive finding, '[ ]'$  denotes negative finding Cardiac  Comments:  Chest pain or chest pressure:    Shortness of breath upon exertion:    Short of breath when lying flat:    Irregular heart rhythm:        Vascular    Pain in calf, thigh, or hip brought on by ambulation:    Pain in feet at night that wakes you up from your sleep:     Blood clot in your veins:    Leg swelling:  x       Pulmonary    Oxygen at home:    Productive cough:     Wheezing:         Neurologic    Sudden weakness in arms or legs:     Sudden numbness in arms or legs:     Sudden onset of difficulty speaking or slurred speech:    Temporary loss of vision in one eye:     Problems with dizziness:          Gastrointestinal    Blood in stool:     Vomited blood:         Genitourinary    Burning when urinating:     Blood in urine:        Psychiatric    Major depression:         Hematologic    Bleeding problems:    Problems with blood clotting too easily:        Skin    Rashes or ulcers:        Constitutional    Fever or chills:      PHYSICAL EXAMINATION:  Today's Vitals   07/03/22 1349  BP: 129/77  Pulse: 70  Temp: 98 F (36.7 C)  TempSrc: Temporal  SpO2: 100%  Weight: 175 lb (79.4 kg)  Height: '5\' 10"'$  (1.778 m)   Body mass index is 25.11 kg/m.   General:  WDWN in NAD; vital signs documented above Gait: Not observed HENT: WNL, normocephalic Pulmonary: normal non-labored breathing without wheezing Cardiac: regular HR; without carotid bruits Abdomen: soft, NT, aortic pulse is not palpable Skin: without  rashes Vascular Exam/Pulses:  Right Left  Radial 2+ (normal) 2+ (normal)  DP amputation 2+ (normal) brisk multiphasic doppler  PT amputation Brisk multiphasic doppler   Extremities: +LLE edema  Neurologic: A&O X 3;  moving all extremities equally Psychiatric:  The pt has Normal affect.   Non-Invasive Vascular Imaging:   Venous duplex on 07/03/2022: +--------------+---------+------+-----------+------------+--------+  LEFT         Reflux NoRefluxReflux TimeDiameter cmsComments                          Yes                                   +--------------+---------+------+-----------+------------+--------+  CFV          no                                              +--------------+---------+------+-----------+------------+--------+  FV prox       no                                              +--------------+---------+------+-----------+------------+--------+  FV mid        no                                              +--------------+---------+------+-----------+------------+--------+  FV dist       no                                               +--------------+---------+------+-----------+------------+--------+  Popliteal    no                                              +--------------+---------+------+-----------+------------+--------+  GSV at [facility]    no                            0.77              +--------------+---------+------+-----------+------------+--------+  GSV prox thighno                            0.69              +--------------+---------+------+-----------+------------+--------+  GSV mid thigh no                            0.40              +--------------+---------+------+-----------+------------+--------+  GSV dist thighno                            0.37              +--------------+---------+------+-----------+------------+--------+  GSV at knee   no                            0.34              +--------------+---------+------+-----------+------------+--------+  GSV prox calf           yes    >500 ms      0.32              +--------------+---------+------+-----------+------------+--------+  SSV Pop Fossa no                            0.26              +--------------+---------+------+-----------+------------+--------+  SSV prox calf no                            0.23              +--------------+---------+------+-----------+------------+--------+   Summary:  Left:  - No evidence of deep vein thrombosis seen in the left lower extremity,  from the common femoral through the popliteal veins.  - No evidence of superficial venous thrombosis in the left lower extremity.    - No evidence of superficial venous reflux seen in the left short saphenous vein.    - Venous reflux is noted in the left greater saphenous vein in the calf.     Adam Ray is a 67 y.o. male who presents with: LLE swelling    -pt has palpable DP pedal pulse with brisk multiphasic doppler flow left DP/PT -pt does not have evidence of DVT.  Pt  does have venous reflux in the GSV in the proximal calf but no other venous reflux -discussed with pt about continuing to wear his compression stockings.  When RN putting his stocking back on, she noted holes present.  He gets his compression from the [place] and was instructed to try to get new pair.   -discussed with him to also continue with leg elevation. -given he has LLE swelling, we will send him for CT venogram of abdomen and pelvis to evaluate for May Thurner and see Dr. Donzetta Matters back in the next few weeks.      Leontine Locket, [facility] Vascular and Vein Specialists [phone]  Clinic MD:  Donzetta Matters

## 2022-07-03 ENCOUNTER — Ambulatory Visit (INDEPENDENT_AMBULATORY_CARE_PROVIDER_SITE_OTHER): Payer: Medicare Other | Admitting: Physician Assistant

## 2022-07-03 ENCOUNTER — Ambulatory Visit (HOSPITAL_COMMUNITY)
Admission: RE | Admit: 2022-07-03 | Discharge: 2022-07-03 | Disposition: A | Discharge status: Home / Self Care | Payer: Medicare Other | Source: Ambulatory Visit | Attending: Vascular Surgery | Admitting: Vascular Surgery

## 2022-07-03 VITALS — BP 129/77 | HR 70 | Temp 98.0°F | Ht 70.0 in | Wt 175.0 lb

## 2022-07-03 DIAGNOSIS — M7989 Other specified soft tissue disorders: Secondary | ICD-10-CM

## 2022-07-05 ENCOUNTER — Ambulatory Visit (INDEPENDENT_AMBULATORY_CARE_PROVIDER_SITE_OTHER): Payer: Medicare Other

## 2022-07-05 VITALS — Temp 98.9°F

## 2022-07-05 DIAGNOSIS — Z794 Long term (current) use of insulin: Secondary | ICD-10-CM

## 2022-07-05 DIAGNOSIS — E1141 Type 2 diabetes mellitus with diabetic mononeuropathy: Secondary | ICD-10-CM | POA: Diagnosis not present

## 2022-07-05 DIAGNOSIS — Z23 Encounter for immunization: Secondary | ICD-10-CM | POA: Diagnosis not present

## 2022-07-05 LAB — POCT GLYCOSYLATED HEMOGLOBIN (HGB A1C): HbA1c, POC (controlled diabetic range): 7.9 % — AB (ref 0.0–7.0)

## 2022-07-07 ENCOUNTER — Ambulatory Visit (INDEPENDENT_AMBULATORY_CARE_PROVIDER_SITE_OTHER): Payer: No Typology Code available for payment source

## 2022-07-07 ENCOUNTER — Encounter (HOSPITAL_COMMUNITY): Payer: Self-pay | Admitting: *Deleted

## 2022-07-07 ENCOUNTER — Other Ambulatory Visit: Payer: Self-pay

## 2022-07-07 ENCOUNTER — Ambulatory Visit (HOSPITAL_COMMUNITY)
Admission: EM | Admit: 2022-07-07 | Discharge: 2022-07-07 | Disposition: A | Discharge status: Home / Self Care | Payer: No Typology Code available for payment source | Attending: Emergency Medicine | Admitting: Emergency Medicine

## 2022-07-07 DIAGNOSIS — R011 Cardiac murmur, unspecified: Secondary | ICD-10-CM

## 2022-07-07 DIAGNOSIS — M25511 Pain in right shoulder: Secondary | ICD-10-CM

## 2022-07-07 DIAGNOSIS — M75101 Unspecified rotator cuff tear or rupture of right shoulder, not specified as traumatic: Secondary | ICD-10-CM

## 2022-07-07 MED ORDER — DICLOFENAC SODIUM 1 % EX GEL: 2.0000 g | Freq: Three times a day (TID) | CUTANEOUS | 0 refills | Status: DC | PRN

## 2022-07-07 NOTE — Discharge Instructions (Addendum)
Your x-ray showed that you have some problems with the rotator cuff..  There is no fracture or dislocation.  Try the topical diclofenac as written and capsaicin cream.  Follow-up with [facility] if not better with the capsaicin and diclofenac gel.  Also, follow-up with your cardiologist.  I believe I heard a faint murmur today.

## 2022-07-07 NOTE — ED Provider Notes (Signed)
HPI  SUBJECTIVE:  Adam Ray is a left handed 67 y.o. male who presents with constant, sharp, right shoulder pain after getting a COVID shot in his left arm 3 days ago.  No trauma to the area, change in his physical activity.  He wonders if he may have slept on it wrong.  No bruising, joint swelling, erythema, fevers, grip weakness, distal numbness or tingling.  He has had symptoms like this before, which was found to be a sprain.  He denies chest pain, pressure, heaviness or exertional component to the shoulder pain.  He tried ibuprofen with improvement in his symptoms.  Symptoms are worse with movement, specifically abduction, backward extension.  He has a past medical history of diabetes, smoking, hypertension, peripheral arterial disease, STEMI on aspirin, hypercholesterolemia, coronary disease, COPD, chronic kidney disease, CHF.  No history of murmur, right shoulder injury.  PCP: [facility]. Orthopedics: [facility]-last saw them in October 23  Past Medical History:  Diagnosis Date   Allergy    Arthritis    Bronchitis    Cataract    CHF (congestive heart failure) ([place])    Chronic kidney disease    Claudication (HCC)    right foot ray resection   Colon polyps    hyperplastic   COPD (chronic obstructive pulmonary disease) (HCC)    Coronary artery disease    Diabetes mellitus    type II   Genital warts    Gout    Hyperlipidemia    Hypertension    Myocardial infarction ([place])    Osteomyelitis of third toe of right foot ([place])    Pneumonia    Status post amputation of toe of right foot ([place]) 09/24/2016    Past Surgical History:  Procedure Laterality Date   AMPUTATION Right 01/31/2016   Procedure: Right 2nd Toe Amputation;  Surgeon: Newt Minion, MD;  Location: [place];  Service: Orthopedics;  Laterality: Right;   AMPUTATION Right 07/08/2018   Procedure: RIGHT TRANSMETATARSAL AMPUTATION;  Surgeon: Newt Minion, MD;  Location: [place];  Service: Orthopedics;   Laterality: Right;   AMPUTATION Right 01/05/2020   Procedure: RIGHT BELOW KNEE AMPUTATION;  Surgeon: Newt Minion, MD;  Location: [place];  Service: Orthopedics;  Laterality: Right;   AMPUTATION Right 02/23/2020   Procedure: RIGHT BELOW KNEE AMPUTATION REVISION;  Surgeon: Newt Minion, MD;  Location: [place];  Service: Orthopedics;  Laterality: Right;   CATARACT EXTRACTION     right eye   COLONOSCOPY     CORONARY STENT INTERVENTION N/A 07/12/2018   Procedure: CORONARY STENT INTERVENTION;  Surgeon: Troy Sine, MD;  Location: [place] [facility];  Service: Cardiovascular;  Laterality: N/A;   CORONARY/GRAFT ACUTE MI REVASCULARIZATION N/A 07/12/2018   Procedure: Coronary/Graft Acute MI Revascularization;  Surgeon: Troy Sine, MD;  Location: [place] [facility];  Service: Cardiovascular;  Laterality: N/A;   I & D EXTREMITY  04/11/2012   Procedure: IRRIGATION AND DEBRIDEMENT EXTREMITY;  Surgeon: Wylene Simmer, MD;  Location: [place];  Service: Orthopedics;  Laterality: Right;   LEFT HEART CATH AND CORONARY ANGIOGRAPHY N/A 07/12/2018   Procedure: LEFT HEART CATH AND CORONARY ANGIOGRAPHY;  Surgeon: Troy Sine, MD;  Location: [place] [facility];  Service: Cardiovascular;  Laterality: N/A;   Surgery left great toe     Tear ducts bilateral eyes     TRANSMETATARSAL AMPUTATION Right 07/08/2018    Family History  Problem Relation Age of Onset   Diabetes Mother  Stroke Mother    Heart failure Father    Heart attack Father    Diabetes Sister        multiple siblings   Diabetes Brother        muliple siblings   Heart disease Brother    Colon cancer Neg Hx    Esophageal cancer Neg Hx    Rectal cancer Neg Hx    Stomach cancer Neg Hx     Social History   Tobacco Use   Smoking status: Former    Packs/day: 0.30    Years: 48.00    Total pack years: 14.40    Types: Cigars, Cigarettes    Start date: 07/02/1963    Quit date: 06/01/2019    Years since quitting: 3.1   Smokeless tobacco: Former   Scientific laboratory technician Use: Former  Substance Use Topics   Alcohol use: Yes    Alcohol/week: 0.0 standard drinks of alcohol   Drug use: No    No current facility-administered medications for this encounter.  Current Outpatient Medications:    diclofenac Sodium (VOLTAREN) 1 % GEL, Apply 2 g topically 3 (three) times daily as needed., Disp: 100 g, Rfl: 0   aspirin 81 MG chewable tablet, Chew 81 mg by mouth daily., Disp: , Rfl:    Baclofen 5 MG TABS, Take 5 mg by mouth 3 (three) times daily as needed., Disp: 20 tablet, Rfl: 0   calcium carbonate (OSCAL) 1500 (600 Ca) MG TABS tablet, Take 600 mg of elemental calcium by mouth daily., Disp: , Rfl:    Cholecalciferol (VITAMIN D3) 50 MCG (2000 UT) TABS, Take 2,000 Units by mouth daily., Disp: , Rfl:    CINNAMON PO, Take 1 tablet by mouth daily., Disp: , Rfl:    Dulaglutide (TRULICITY) 1.5 ZY/6.0YT SOPN, Inject 1.5 mg into the skin once a week., Disp: 2 mL, Rfl: 3   famotidine (PEPCID) 20 MG tablet, Take 20 mg by mouth daily., Disp: , Rfl:    furosemide (LASIX) 20 MG tablet, TAKE 1 TABLET BY MOUTH EVERY DAY, Disp: 90 tablet, Rfl: 1   gabapentin (NEURONTIN) 100 MG capsule, Take 1 capsule (100 mg total) by mouth 2 (two) times daily., Disp: 60 capsule, Rfl: 2   Guaifenesin 100 MG PACK, Take 1 each by mouth daily., Disp: , Rfl:    insulin aspart (NOVOLOG) 100 UNIT/ML injection, Inject 30 Units into the skin 2 (two) times daily before a meal., Disp: , Rfl:    insulin glargine (LANTUS) 100 UNIT/ML injection, Inject 25 Units into the skin 2 (two) times daily., Disp: , Rfl:    metFORMIN (GLUCOPHAGE) 500 MG tablet, TAKE 1 TABLET BY MOUTH 2 TIMES DAILY WITH A MEAL., Disp: 180 tablet, Rfl: 1   metoprolol tartrate (LOPRESSOR) 25 MG tablet, Take 25 mg by mouth 2 (two) times daily., Disp: , Rfl:    Multiple Vitamin (MULTIVITAMIN WITH MINERALS) TABS tablet, Take 1 tablet by mouth daily., Disp: , Rfl:    rosuvastatin (CRESTOR) 40 MG tablet, Take 1 tablet (40 mg  total) by mouth daily at 6 PM., Disp: , Rfl:    sildenafil (VIAGRA) 100 MG tablet, Take 100 mg by mouth daily as needed for erectile dysfunction., Disp: , Rfl:    tamsulosin (FLOMAX) 0.4 MG CAPS capsule, Take 1 capsule (0.4 mg total) by mouth daily., Disp: 30 capsule, Rfl: 3   tiZANidine (ZANAFLEX) 4 MG capsule, Take 1 capsule (4 mg total) by mouth 3 (three) times daily., Disp: 15 capsule,  Rfl: 0   Zoster Vaccine Adjuvanted [facility]) injection, Administer Shingrix vaccination now and repeat in two months, Disp: 1 each, Rfl: 1  Allergies  Allergen Reactions   Codeine Other (See Comments)    Heart attack.     ROS  As noted in HPI.   Physical Exam  BP (!) 148/79   Pulse 73   Temp 98.4 F (36.9 C)   Resp 20   SpO2 95%   Constitutional: Well developed, well nourished, no acute distress Eyes:  EOMI, conjunctiva normal bilaterally HENT: Normocephalic, atraumatic,mucus membranes moist Respiratory: Normal inspiratory effort Cardiovascular: Normal rate.  Regular rhythm, positive systolic ejection murmur.  No carotid bruit.  No tenderness over the anterior neck. GI: nondistended skin: No rash, skin intact Musculoskeletal: no deformity, erythema, swelling, bruising of the shoulder. R  shoulder with ROM  somewhat limited, Drop test abnormal,  clavicle NT, A/C joint tender, scapula NT, proximal humerus tender, trapezius  NT, motor strength decreased at shoulder due to pain, Sensation intact LT over deltoid region, distal NVI with hand having intact sensation and strength in the median, radial, and ulnar nerve distribution.   Pain  with internal rotation, pain with external rotation, negative tenderness in bicipital groove, positive empty can test,  positive  liftoff test, no instability with abduction/external rotation. RP 2+  Neurologic: Alert & oriented x 3, no focal neuro deficits Psychiatric: Speech and behavior appropriate   ED Course   Medications - No data to display  Orders  Placed This Encounter  Procedures   DG Shoulder Right    Standing Status:   Standing    Number of Occurrences:   1    Order Specific Question:   Reason for Exam (SYMPTOM  OR DIAGNOSIS REQUIRED)    Answer:   no trauma . diffuse pain with movement. r/o acute changes    No results found. However, due to the size of the patient record, not all encounters were searched. Please check Results Review for a complete set of results. DG Shoulder Right  Result Date: 07/07/2022 CLINICAL DATA:  Pain without trauma EXAM: RIGHT SHOULDER - 2+ VIEW COMPARISON:  None Available. FINDINGS: Irregularity along the lateral humeral head at the rotator cuff insertion site. Irregularity along the undersurface of the acromion. The humeral head is relatively high riding with narrowed space between the acromion and humeral head. No fractures or dislocations. No other abnormalities. IMPRESSION: Irregularity along the lateral humeral head at the rotator cuff insertion site and along the undersurface of the acromion. The humeral head is relatively high riding with narrowed space between the acromion and humeral head. The findings suggest rotator cuff pathology. No other abnormalities. No fracture or dislocation. Electronically Signed   By: Dorise Bullion III M.D.   On: 07/07/2022 15:58    ED Clinical Impression  1. Rotator cuff syndrome of right shoulder   2. Undiagnosed cardiac murmurs      ED Assessment/Plan     The pain is reproducible with movement, there is no exertional component to it, he states that he has had identical symptoms before which was found to be due to a strain, he does have some tenderness at the [facility] joint/proximal deltoid suggestive of a bursitis versus rotator cuff syndrome.  Doubt ACS.  Deferring EKG.  Will check x-ray per patient request.  Reviewed imaging independently.  Irregularity along the lateral humeral head of the rotator cuff insertion site and along the undersurface of the acromion.   Humeral head relatively high riding  with narrowed space between the acromion and humeral head.  Findings consistent with rotator cuff pathology.  No fracture or dislocation.  See radiology report for full details.  Calculated creatinine clearance from labs done on 03/14/2022 71 mL/min.  Patient states that he cannot take Tylenol due to an allergy.  He can take NSAIDs.  Will send home with topical diclofenac, capsaicin cream.  Follow-up with [facility] or with [facility] if still having problems in a week.  ER return precautions given.  Discussed  imaging, MDM, treatment plan, and plan for follow-up with patient and spouse.  Discussed sn/sx that should prompt return to the ED. they agree with plan.   Meds ordered this encounter  Medications   diclofenac Sodium (VOLTAREN) 1 % GEL    Sig: Apply 2 g topically 3 (three) times daily as needed.    Dispense:  100 g    Refill:  0      *This clinic note was created using [employer]. Therefore, there may be occasional mistakes despite careful proofreading.  ?    Melynda Ripple, MD 07/08/22 1511

## 2022-07-07 NOTE — ED Triage Notes (Signed)
Pt reports huis rt shoulder and rt neck pain started on Friday. Pt reports he had a COVID shot on Friday on LT Shoulder. PT thinks the COVID shot caused the pain.

## 2022-07-08 ENCOUNTER — Telehealth: Payer: Self-pay

## 2022-07-08 NOTE — Progress Notes (Signed)
Patient in clinic on Friday afternoon, 07/05/22 and received flu vaccination. Administered by Mercer Pod, CMA.   Talbot Grumbling, RN

## 2022-07-08 NOTE — Telephone Encounter (Signed)
Pt called stating he had important information in order for this office "to get paid".  Reviewed pt's chart, returned call for clarification, no answer, vm full.

## 2022-07-09 ENCOUNTER — Other Ambulatory Visit: Payer: Self-pay

## 2022-07-09 ENCOUNTER — Ambulatory Visit: Payer: No Typology Code available for payment source | Admitting: Podiatry

## 2022-07-09 DIAGNOSIS — M7989 Other specified soft tissue disorders: Secondary | ICD-10-CM

## 2022-07-11 ENCOUNTER — Telehealth: Payer: Self-pay | Admitting: Podiatry

## 2022-07-11 NOTE — Telephone Encounter (Addendum)
Called and spoke with Adam Ray and she stated she called the wrong office. She was trying to contact [place] with another office.

## 2022-07-11 NOTE — Telephone Encounter (Signed)
This is Adam Ray with [facility] calling about an authorization we've been working on to get since October. I've spoken about it with you also. If you would call me back, I'm in the office this morning. My number is [phone]. Thanks.

## 2022-07-12 ENCOUNTER — Encounter: Payer: Self-pay | Admitting: Family Medicine

## 2022-07-18 ENCOUNTER — Ambulatory Visit (INDEPENDENT_AMBULATORY_CARE_PROVIDER_SITE_OTHER): Payer: 59 | Admitting: Orthopedic Surgery

## 2022-07-18 DIAGNOSIS — Z89511 Acquired absence of right leg below knee: Secondary | ICD-10-CM | POA: Diagnosis not present

## 2022-07-18 DIAGNOSIS — I890 Lymphedema, not elsewhere classified: Secondary | ICD-10-CM

## 2022-07-18 DIAGNOSIS — I872 Venous insufficiency (chronic) (peripheral): Secondary | ICD-10-CM | POA: Diagnosis not present

## 2022-07-22 ENCOUNTER — Ambulatory Visit (INDEPENDENT_AMBULATORY_CARE_PROVIDER_SITE_OTHER): Payer: No Typology Code available for payment source | Admitting: Podiatry

## 2022-07-22 ENCOUNTER — Encounter: Payer: Self-pay | Admitting: Podiatry

## 2022-07-22 DIAGNOSIS — S88119A Complete traumatic amputation at level between knee and ankle, unspecified lower leg, initial encounter: Secondary | ICD-10-CM | POA: Diagnosis not present

## 2022-07-22 DIAGNOSIS — I739 Peripheral vascular disease, unspecified: Secondary | ICD-10-CM

## 2022-07-22 DIAGNOSIS — L84 Corns and callosities: Secondary | ICD-10-CM

## 2022-07-22 DIAGNOSIS — M79675 Pain in left toe(s): Secondary | ICD-10-CM | POA: Diagnosis not present

## 2022-07-22 DIAGNOSIS — B351 Tinea unguium: Secondary | ICD-10-CM | POA: Diagnosis not present

## 2022-07-22 DIAGNOSIS — E1151 Type 2 diabetes mellitus with diabetic peripheral angiopathy without gangrene: Secondary | ICD-10-CM | POA: Diagnosis not present

## 2022-07-22 NOTE — Progress Notes (Signed)
This patient returns to my office for at risk foot care.  This patient requires this care by a professional since this patient will be at risk due to having diabetic neuropathy and PAD.    This patient is unable to cut nails himself since the patient cannot reach his  left foot nails.These nails are painful walking and wearing shoes. He presents to the office with his wife. This patient presents for at risk foot care today.  General Appearance  Alert, conversant and in no acute stress.  Vascular  Dorsalis pedis and posterior tibial  pulses are  not palpable  Left..  Capillary return is within normal limits . Cold feet  left. bilaterally.  Neurologic  Senn-Weinstein monofilament wire test diminished  left. Muscle power within normal limits left.  Nails Thick disfigured discolored nails with subungual debris  from hallux to fifth toes left. No evidence of bacterial infection or drainage left.  Orthopedic  No limitations of motion  feet .  No crepitus or effusions noted.  No bony pathology or digital deformities noted. BK amputation right.  HAV  left.  Hammer toes 2-5 left foot..    Skin  normotropic skin with no porokeratosis noted .  No signs of infections or ulcers noted.   Localized callus1st MPJ left foot plantarly.  Onychomycosis   Pain in left toes  Consent was obtained for treatment procedures.   Mechanical debridement of nails 1-5  left performed with a nail nipper.  Filed with dremel without incident.   Filed callus left foot with dremel tool.  Return office visit   10 weeks                 Told patient to return for periodic foot care and evaluation due to potential at risk complications.   Gardiner Barefoot DPM

## 2022-07-23 ENCOUNTER — Encounter: Payer: Self-pay | Admitting: Orthopedic Surgery

## 2022-07-23 ENCOUNTER — Ambulatory Visit (HOSPITAL_COMMUNITY)
Admission: RE | Admit: 2022-07-23 | Discharge: 2022-07-23 | Disposition: A | Discharge status: Home / Self Care | Payer: 59 | Source: Ambulatory Visit | Attending: Vascular Surgery | Admitting: Vascular Surgery

## 2022-07-23 DIAGNOSIS — I878 Other specified disorders of veins: Secondary | ICD-10-CM | POA: Diagnosis not present

## 2022-07-23 DIAGNOSIS — M7989 Other specified soft tissue disorders: Secondary | ICD-10-CM | POA: Insufficient documentation

## 2022-07-23 DIAGNOSIS — N271 Small kidney, bilateral: Secondary | ICD-10-CM | POA: Diagnosis not present

## 2022-07-23 DIAGNOSIS — K429 Umbilical hernia without obstruction or gangrene: Secondary | ICD-10-CM | POA: Diagnosis not present

## 2022-07-23 MED ORDER — IOHEXOL 350 MG/ML SOLN
75.0000 mL | Freq: Once | INTRAVENOUS | Status: AC | PRN
  Administered 2022-07-23: 75 mL via INTRAVENOUS

## 2022-07-23 NOTE — Progress Notes (Signed)
Office Visit Note   Patient: Adam Ray           Date of Birth: [birth year]           MRN: [id number] Visit Date: 07/18/2022              Requested by: [name], MD [address] [place],  [place] [address] PCP: [name], MD  Chief Complaint  Patient presents with   Left Leg - Pain      HPI: Patient is a 67 year old gentleman who presents with 2 separate issues.  He status post right transtibial amputation and has lymphedema swelling left lower extremity with venous insufficiency.  Patient states that as soon as his leg is dependent the swelling comes back.  Assessment & Plan: Visit Diagnoses:  1. Acquired absence of right leg below knee (HCC)   2. Lymphedema due to venous insufficiency     Plan: Recommended continue with elevation and compression we will investigate availability to obtain lymphedema pumps.  Follow-Up Instructions: Return in about 4 weeks (around 08/15/2022).   Ortho Exam  Patient is alert, oriented, no adenopathy, well-dressed, normal affect, normal respiratory effort. Examination patient is a stable right transtibial amputation no ulcers preexamination the left leg his thigh is 50 cm in circumference the calf is 42 cm in circumference and the ankle is 35 cm in circumference.  There is pitting edema in the foot ankle leg and thigh without cellulitis drainage or ulceration.  Imaging: No results found. No images are attached to the encounter.  Labs: Lab Results  Component Value Date   HGBA1C 7.9 (A) 07/05/2022   HGBA1C 9.4 (H) 01/17/2022   HGBA1C 7.8 (A) 09/14/2021   ESRSEDRATE 54 (H) 01/01/2020   ESRSEDRATE 8 01/29/2016   ESRSEDRATE 9 04/11/2012   CRP 2.6 (H) 01/29/2016   CRP 0.5 02/28/2014   REPTSTATUS 01/18/2020 FINAL 01/17/2020   REPTSTATUS 01/22/2020 FINAL 01/17/2020   REPTSTATUS 01/22/2020 FINAL 01/17/2020   GRAMSTAIN  04/11/2012    NO WBC SEEN RARE SQUAMOUS EPITHELIAL CELLS PRESENT MODERATE GRAM POSITIVE  COCCI IN PAIRS RARE GRAM NEGATIVE RODS   GRAMSTAIN  04/11/2012    NO WBC SEEN RARE SQUAMOUS EPITHELIAL CELLS PRESENT MODERATE GRAM POSITIVE COCCI IN PAIRS RARE GRAM NEGATIVE RODS   CULT (A) 01/17/2020    <10,000 COLONIES/mL INSIGNIFICANT GROWTH Performed at [facility], [address], [place], [place] [address]    CULT  01/17/2020    NO GROWTH 5 DAYS Performed at [facility] [address] [place], [place] [address]    CULT  01/17/2020    NO GROWTH 5 DAYS Performed at [facility] [address], [place], [place] [address]    LABORGA NO GROWTH 2 DAYS 07/19/2014     Lab Results  Component Value Date   ALBUMIN 4.5 01/17/2022   ALBUMIN 4.1 12/06/2020   ALBUMIN 4.3 12/01/2020    No results found for: "MG" No results found for: "VD25OH"  No results found for: "PREALBUMIN"    Latest Ref Rng & Units 01/17/2022    3:14 PM 12/06/2020    4:42 PM 12/01/2020    3:51 PM  CBC EXTENDED  WBC 3.4 - 10.8 x10E3/uL 3.8  5.5  6.3   RBC 4.14 - 5.80 x10E6/uL 4.64  4.90  4.66   Hemoglobin 13.0 - 17.7 g/dL 13.5  13.7  13.1   HCT 37.5 - 51.0 % 39.6  39.8  38.8  Platelets 150 - 450 x10E3/uL 104  138  104.0   NEUT# 1.4 - 7.7 K/uL   4.8   Lymph# 0.7 - 4.0 K/uL   0.9      There is no height or weight on file to calculate BMI.  Orders:  No orders of the defined types were placed in this encounter.  No orders of the defined types were placed in this encounter.    Procedures: No procedures performed  Clinical Data: No additional findings.  ROS:  All other systems negative, except as noted in the HPI. Review of Systems  Objective: Vital Signs: There were no vitals taken for this visit.  Specialty Comments:  No specialty comments available.  PMFS History: Patient Active Problem List   Diagnosis Date Noted   Tongue discoloration 02/14/2022   Hematuria 08/16/2021   Illness 12/06/2020   Urinary incontinence 08/10/2020   Blurred vision, bilateral  07/13/2020   Increased urinary frequency 05/09/2020   Dehiscence of amputation stump ([place])    Coronary artery disease 01/18/2020   Nocturnal leg cramps 10/28/2019   Hyperlipidemia 05/26/2019   Edema leg 12/29/2018   STEMI involving left circumflex coronary artery ([place]) 07/12/2018   Status post transmetatarsal amputation of foot, right ([place]) 07/08/2018   Limited mobility 05/12/2018   Gingival foreign body 07/22/2017   Anxiety state 06/02/2017   Leg swelling 01/29/2017   Idiopathic chronic venous hypertension of both lower extremities with inflammation 09/24/2016   Testicular mass 04/18/2016   Subacute osteomyelitis, right ankle and foot ([place]) 01/29/2016   Diabetic polyneuropathy associated with type 2 diabetes mellitus ([place])    Type 2 diabetes mellitus with neurologic complication, with long-term current use of insulin ([place]) 12/08/2015   Pulmonary nodule 02/01/2015   Depression 12/09/2013   Warts, genital 08/20/2013   Diabetic retinopathy ([place]) 01/28/2013   Sleep apnea 09/06/2010   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   Essential hypertension 11/09/2008   COPD, mild ([place]) 10/06/2006   SICKLE-CELL TRAIT 04/26/2006   ERECTILE DYSFUNCTION 04/26/2006   Tobacco use 04/26/2006   PAD (peripheral artery disease) ([place]) 04/26/2006   ALLERGIC RHINITIS 04/26/2006   Past Medical History:  Diagnosis Date   Allergy    Arthritis    Bronchitis    Cataract    CHF (congestive heart failure) ([place])    Chronic kidney disease    Claudication ([place])    right foot ray resection   Colon polyps    hyperplastic   COPD (chronic obstructive pulmonary disease) ([place])    Coronary artery disease    Diabetes mellitus    type II   Genital warts    Gout    Hyperlipidemia    Hypertension    Myocardial infarction ([place])    Osteomyelitis of third toe of right foot ([place])    Pneumonia    Status post amputation of toe of right foot ([place]) 09/24/2016    Family History  Problem Relation Age of Onset   Diabetes  Mother    Stroke Mother    Heart failure Father    Heart attack Father    Diabetes Sister        multiple siblings   Diabetes Brother        muliple siblings   Heart disease Brother    Colon cancer Neg Hx    Esophageal cancer Neg Hx    Rectal cancer Neg Hx    Stomach cancer Neg Hx     Past Surgical History:  Procedure Laterality Date   AMPUTATION  Right 01/31/2016   Procedure: Right 2nd Toe Amputation;  Surgeon: Newt Minion, MD;  Location: [facility];  Service: Orthopedics;  Laterality: Right;   AMPUTATION Right 07/08/2018   Procedure: RIGHT TRANSMETATARSAL AMPUTATION;  Surgeon: Newt Minion, MD;  Location: [name];  Service: Orthopedics;  Laterality: Right;   AMPUTATION Right 01/05/2020   Procedure: RIGHT BELOW KNEE AMPUTATION;  Surgeon: Newt Minion, MD;  Location: [facility];  Service: Orthopedics;  Laterality: Right;   AMPUTATION Right 02/23/2020   Procedure: RIGHT BELOW KNEE AMPUTATION REVISION;  Surgeon: Newt Minion, MD;  Location: [facility];  Service: Orthopedics;  Laterality: Right;   CATARACT EXTRACTION     right eye   COLONOSCOPY     CORONARY STENT INTERVENTION N/A 07/12/2018   Procedure: CORONARY STENT INTERVENTION;  Surgeon: Troy Sine, MD;  Location: [facility];  Service: Cardiovascular;  Laterality: N/A;   CORONARY/GRAFT ACUTE MI REVASCULARIZATION N/A 07/12/2018   Procedure: Coronary/Graft Acute MI Revascularization;  Surgeon: Troy Sine, MD;  Location: [facility];  Service: Cardiovascular;  Laterality: N/A;   I & D EXTREMITY  04/11/2012   Procedure: IRRIGATION AND DEBRIDEMENT EXTREMITY;  Surgeon: Wylene Simmer, MD;  Location: [facility];  Service: Orthopedics;  Laterality: Right;   LEFT HEART CATH AND CORONARY ANGIOGRAPHY N/A 07/12/2018   Procedure: LEFT HEART CATH AND CORONARY ANGIOGRAPHY;  Surgeon: Troy Sine, MD;  Location: [facility];  Service: Cardiovascular;  Laterality: N/A;   Surgery left great toe     Tear ducts bilateral eyes      TRANSMETATARSAL AMPUTATION Right 07/08/2018   Social History   Occupational History   Occupation: disabled  Tobacco Use   Smoking status: Former    Packs/day: 0.30    Years: 48.00    Total pack years: 14.40    Types: Cigars, Cigarettes    Start date: 07/02/1963    Quit date: 06/01/2019    Years since quitting: 3.1   Smokeless tobacco: Former  Scientific laboratory technician Use: Former  Substance and Sexual Activity   Alcohol use: Yes    Alcohol/week: 0.0 standard drinks of alcohol   Drug use: No   Sexual activity: Not on file

## 2022-07-24 LAB — POCT I-STAT CREATININE: Creatinine, Ser: 1.2 mg/dL (ref 0.61–1.24)

## 2022-07-26 ENCOUNTER — Encounter: Payer: Self-pay | Admitting: Cardiovascular Disease

## 2022-07-26 ENCOUNTER — Ambulatory Visit
Payer: No Typology Code available for payment source | Attending: Cardiovascular Disease | Admitting: Cardiovascular Disease

## 2022-07-26 VITALS — BP 120/70 | HR 67 | Ht 70.0 in | Wt 179.4 lb

## 2022-07-26 DIAGNOSIS — I739 Peripheral vascular disease, unspecified: Secondary | ICD-10-CM | POA: Diagnosis not present

## 2022-07-26 DIAGNOSIS — M7989 Other specified soft tissue disorders: Secondary | ICD-10-CM

## 2022-07-26 DIAGNOSIS — I2510 Atherosclerotic heart disease of native coronary artery without angina pectoris: Secondary | ICD-10-CM | POA: Diagnosis not present

## 2022-07-26 DIAGNOSIS — E782 Mixed hyperlipidemia: Secondary | ICD-10-CM

## 2022-07-26 DIAGNOSIS — I10 Essential (primary) hypertension: Secondary | ICD-10-CM | POA: Diagnosis not present

## 2022-07-26 DIAGNOSIS — I2121 ST elevation (STEMI) myocardial infarction involving left circumflex coronary artery: Secondary | ICD-10-CM

## 2022-07-26 MED ORDER — FUROSEMIDE 20 MG PO TABS: 20.0000 mg | ORAL_TABLET | Freq: Two times a day (BID) | ORAL | 3 refills | Status: DC

## 2022-07-26 NOTE — Assessment & Plan Note (Signed)
History of hyperlipidemia on statin therapy with lipid profile performed 01/17/2022 revealing total cholesterol 97, LDL 14 HDL 40.

## 2022-07-26 NOTE — Patient Instructions (Signed)
Medication Instructions:  Your physician has recommended you make the following change in your medication:   -Increase furosemide (lasix) to '20mg'$  twice daily.  *If you need a refill on your cardiac medications before your next appointment, please call your pharmacy*   Follow-Up: At [facility], you and your health needs are our priority.  As part of our continuing mission to provide you with exceptional heart care, we have created designated Provider Care Teams.  These Care Teams include your primary Cardiologist (physician) and Advanced Practice Providers (APPs -  Physician Assistants and Nurse Practitioners) who all work together to provide you with the care you need, when you need it.  We recommend signing up for the patient portal called "MyChart".  Sign up information is provided on this After Visit Summary.  MyChart is used to connect with patients for Virtual Visits (Telemedicine).  Patients are able to view lab/test results, encounter notes, upcoming appointments, etc.  Non-urgent messages can be sent to your provider as well.   To learn more about what you can do with MyChart, go to [email or web address].    Your next appointment:   6 month(s)  Provider:   Diona Browner, NP      Then, Quay Burow, MD will plan to see you again in 12 month(s).

## 2022-07-26 NOTE — Progress Notes (Signed)
07/26/2022 Adam Ray   1955/10/19  [id number]  Primary Physician [name] Delorse Limber, MD Primary Cardiologist: Lorretta Harp MD FACP, [place], [place], [place]  HPI:  Adam Ray is a 67 y.o.  married Caucasian male father of 2 children, grandfather of 8 grandchildren who I last saw in the office 02/26/2022.  He is accompanied by his wife [name] today.  He was recently discharged from [facility] after having had a "STEMI" for an occluded circumflex coronary artery on 07/12/2018.  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  His risk factors include recently discontinue tobacco abuse having smoked the majority of his life and diabetes as well as hyperlipidemia.  He does have a family history for heart disease with a father and brother both of who had myocardial infarction's.   Dr. Claiborne Billings placed a synergy drug-eluting stent in his circumflex.  The LAD and RCA were free of significant disease.  He was discharged home on aspirin and Brilinta.    He has stopped smoking.  He was initially confined to wheelchair but now walks with his prosthesis and a cane.  It sounds like he gets phantom pain in his right leg.  He does have left lower extremity swelling which is why he was referred back.  Echo performed 07/13/2018 revealed normal LV systolic function with grade 1 diastolic dysfunction.  His leg is wrapped.  He was on torsemide once a day.  Since I saw him 5 months ago he has remained stable.  He walks with a cane.  He denies chest pain or shortness of breath.  He does continue to have left lower extremity edema wearing a compression stocking on Lasix 20 mg once a day which I plan on increasing to twice daily.  Current Meds  Medication Sig   aspirin 81 MG chewable tablet Chew 81 mg by mouth daily.   Baclofen 5 MG TABS Take 5 mg by mouth 3 (three) times daily as needed.   calcium carbonate (OSCAL) 1500 (600 Ca) MG TABS tablet Take 600 mg of elemental  calcium by mouth daily.   Cholecalciferol (VITAMIN D3) 50 MCG (2000 UT) TABS Take 2,000 Units by mouth daily.   CINNAMON PO Take 1 tablet by mouth daily.   diclofenac Sodium (VOLTAREN) 1 % GEL Apply 2 g topically 3 (three) times daily as needed.   Dulaglutide (TRULICITY) 1.5 CB/7.6EG SOPN Inject 1.5 mg into the skin once a week.   famotidine (PEPCID) 20 MG tablet Take 20 mg by mouth daily.   gabapentin (NEURONTIN) 100 MG capsule Take 1 capsule (100 mg total) by mouth 2 (two) times daily.   Guaifenesin 100 MG PACK Take 1 each by mouth daily.   insulin aspart (NOVOLOG) 100 UNIT/ML injection Inject 30 Units into the skin 2 (two) times daily before a meal.   insulin glargine (LANTUS) 100 UNIT/ML injection Inject 25 Units into the skin 2 (two) times daily.   metFORMIN (GLUCOPHAGE) 500 MG tablet TAKE 1 TABLET BY MOUTH 2 TIMES DAILY WITH A MEAL.   metoprolol tartrate (LOPRESSOR) 25 MG tablet Take 25 mg by mouth 2 (two) times daily.   Multiple Vitamin (MULTIVITAMIN WITH MINERALS) TABS tablet Take 1 tablet by mouth daily.   rosuvastatin (CRESTOR) 40 MG tablet Take 1 tablet (40 mg total) by mouth daily at 6 PM.   sildenafil (VIAGRA) 100 MG tablet Take 100 mg by mouth daily as needed for erectile dysfunction.  tamsulosin (FLOMAX) 0.4 MG CAPS capsule Take 1 capsule (0.4 mg total) by mouth daily.   tiZANidine (ZANAFLEX) 4 MG capsule Take 1 capsule (4 mg total) by mouth 3 (three) times daily.   Zoster Vaccine Adjuvanted [facility]) injection Administer Shingrix vaccination now and repeat in two months   [DISCONTINUED] furosemide (LASIX) 20 MG tablet TAKE 1 TABLET BY MOUTH EVERY DAY     Allergies  Allergen Reactions   Codeine Other (See Comments)    Heart attack.    Social History   Socioeconomic History   Marital status: Married    Spouse name: Not on file   Number of children: 3   Years of education: Not on file   Highest education level: Not on file  Occupational History   Occupation:  disabled  Tobacco Use   Smoking status: Former    Packs/day: 0.30    Years: 48.00    Total pack years: 14.40    Types: Cigars, Cigarettes    Start date: 07/02/1963    Quit date: 06/01/2019    Years since quitting: 3.1   Smokeless tobacco: Former  Scientific laboratory technician Use: Former  Substance and Sexual Activity   Alcohol use: Yes    Alcohol/week: 0.0 standard drinks of alcohol   Drug use: No   Sexual activity: Not on file  Other Topics Concern   Not on file  Social History Narrative   Not on file   Social Determinants of Health   Financial Resource Strain: Not on file  Food Insecurity: Not on file  Transportation Needs: Not on file  Physical Activity: Not on file  Stress: Not on file  Social Connections: Not on file  Intimate Partner Violence: Not on file     Review of Systems: General: negative for chills, fever, night sweats or weight changes.  Cardiovascular: negative for chest pain, dyspnea on exertion, edema, orthopnea, palpitations, paroxysmal nocturnal dyspnea or shortness of breath Dermatological: negative for rash Respiratory: negative for cough or wheezing Urologic: negative for hematuria Abdominal: negative for nausea, vomiting, diarrhea, bright red blood per rectum, melena, or hematemesis Neurologic: negative for visual changes, syncope, or dizziness All other systems reviewed and are otherwise negative except as noted above.    Blood pressure 120/70, pulse 67, height '5\' 10"'$  (1.778 m), weight 179 lb 6.4 oz (81.4 kg), SpO2 99 %.  General appearance: alert and no distress Neck: no adenopathy, no carotid bruit, no JVD, supple, symmetrical, trachea midline, and thyroid not enlarged, symmetric, no tenderness/mass/nodules Lungs: clear to auscultation bilaterally Heart: regular rate and rhythm, S1, S2 normal, no murmur, click, rub or gallop Extremities: extremities normal, atraumatic, no cyanosis or edema Pulses: 2+ and symmetric Skin: Skin color, texture, turgor  normal. No rashes or lesions Neurologic: Grossly normal  EKG sinus rhythm at 67 with LVH voltage and septal Q waves.  Personally reviewed this EKG.  ASSESSMENT AND PLAN:   Essential hypertension History of essential hypertension blood pressure measured today at 120/70.  He is on metoprolol.  Leg swelling Lower extremity edema thought to be related to diastolic dysfunction plus or minus venous insufficiency.  He is wearing compression stockings.  He is status post right BKA.  He is wearing compression stockings in the left.  He is on furosemide 20 mg a day which we will increase to twice daily.  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 Claiborne Billings placed a drug-eluting stent in his circumflex.  RCA  and LAD were free of significant disease.  He denies chest pain or shortness of breath.  Hyperlipidemia History of hyperlipidemia on statin therapy with lipid profile performed 01/17/2022 revealing total cholesterol 97, LDL 14 HDL 40.     Lorretta Harp MD FACP,FACC,FAHA, [facility] 07/26/2022 1:58 PM

## 2022-07-26 NOTE — Assessment & Plan Note (Signed)
Lower extremity edema thought to be related to diastolic dysfunction plus or minus venous insufficiency.  He is wearing compression stockings.  He is status post right BKA.  He is wearing compression stockings in the left.  He is on furosemide 20 mg a day which we will increase to twice daily.

## 2022-07-26 NOTE — Assessment & Plan Note (Signed)
History of CAD status post coronary intervention by Dr. Claiborne Billings placed a drug-eluting stent in his circumflex.  RCA and LAD were free of significant disease.  He denies chest pain or shortness of breath.

## 2022-07-26 NOTE — Assessment & Plan Note (Signed)
History of essential hypertension blood pressure measured today at 120/70.  He is on metoprolol.

## 2022-07-29 LAB — PROTEIN / CREATININE RATIO, URINE
Albumin, U: 0.87
Creatinine, Urine: 1.32

## 2022-07-29 LAB — HEMOGLOBIN A1C: Hemoglobin A1C: 8.5

## 2022-07-29 LAB — COMPREHENSIVE METABOLIC PANEL: eGFR: 59

## 2022-07-29 LAB — MICROALBUMIN / CREATININE URINE RATIO: Microalb Creat Ratio: 4

## 2022-07-31 ENCOUNTER — Telehealth: Payer: Self-pay | Admitting: Cardiovascular Disease

## 2022-07-31 ENCOUNTER — Ambulatory Visit: Payer: 59 | Admitting: Vascular Surgery

## 2022-07-31 NOTE — Telephone Encounter (Signed)
Pt c/o medication issue:  1. Name of Medication: ticagrelor 90 mg  2. How are you currently taking this medication (dosage and times per day)? 1 tablet twice a day  3. Are you having a reaction (difficulty breathing--STAT)? no  4. What is your medication issue? Patient called to refill the medication, but it is not currently listed on his chart.

## 2022-07-31 NOTE — Telephone Encounter (Signed)
Will route to MD to review-   Per notes in chart, patient heart cath with stent was in 2020- per notes: DAPT therapy for a minimum of 12 months of aspirin/Brilinta. Optimal blood pressure control. Aggressive lipid intervention with target LDL less than 70.    Patient had continued on the Brilinta, was recently removed 03/2022 visit with Raquel Sarna, NP- nothing in notes about continuing or discontinuing. Just wanted to make sure as per cath notes from 2020. He does not need to take Brilitna any longer.   Thanks!

## 2022-08-02 NOTE — Telephone Encounter (Signed)
Adam Harp, MD  Adam Ray, LPN2 days ago    OK to stop Brilenta and take ASA alone   Patient wife aware of notes from MD. No further assistance needed

## 2022-08-02 NOTE — Telephone Encounter (Signed)
Patient returned RN's call. 

## 2022-08-02 NOTE — Telephone Encounter (Signed)
Pt c/o medication issue:  1. Name of Medication:  Ticagrelor 90   2. How are you currently taking this medication (dosage and times per day)?   3. Are you having a reaction (difficulty breathing--STAT)?   4. What is your medication issue?   Patient's wife is following up. She would like to know why this medication has been discontinued. Please advise.

## 2022-08-02 NOTE — Telephone Encounter (Signed)
Called patient advised of message from [facility].   Patient verbalized understanding, thankful for call back.

## 2022-08-02 NOTE — Telephone Encounter (Signed)
Called patient, advised to call back to discuss.   Left call back number.

## 2022-08-07 ENCOUNTER — Ambulatory Visit (INDEPENDENT_AMBULATORY_CARE_PROVIDER_SITE_OTHER): Payer: 59 | Admitting: Vascular Surgery

## 2022-08-07 ENCOUNTER — Encounter: Payer: Self-pay | Admitting: Vascular Surgery

## 2022-08-07 VITALS — BP 114/62 | HR 68 | Temp 98.0°F | Resp 20 | Ht 70.0 in | Wt 179.0 lb

## 2022-08-07 DIAGNOSIS — M7989 Other specified soft tissue disorders: Secondary | ICD-10-CM | POA: Diagnosis not present

## 2022-08-07 NOTE — Progress Notes (Signed)
Patient ID: Adam Ray, male   DOB: 01/09/1956, 67 y.o.   MRN: [id number]  Reason for Consult: Follow-up   Referred by [name], MD  Subjective:     HPI:  Adam Ray is a 67 y.o. male with history of right lower extremity amputation and has been followed for PAD although studies here demonstrated toe pressure greater than 60.  His current complaint has been left lower extremity swelling.  He does wear compression stockings that he gets from the [place] although he states they are not very tight.  He follows up today with CT scan for evaluation of May Thurner syndrome.  Patient without history of DVT does have a history of heart failure and walks with the help of a cane and his right lower extremity prosthetic.  Leg swelling associated with pain heaviness.  Past Medical History:  Diagnosis Date   Allergy    Arthritis    Bronchitis    Cataract    CHF (congestive heart failure) (HCC)    Chronic kidney disease    Claudication (HCC)    right foot ray resection   Colon polyps    hyperplastic   COPD (chronic obstructive pulmonary disease) (HCC)    Coronary artery disease    Diabetes mellitus    type II   Genital warts    Gout    Hyperlipidemia    Hypertension    Myocardial infarction ([place])    Osteomyelitis of third toe of right foot (HCC)    Pneumonia    Status post amputation of toe of right foot ([place]) 09/24/2016   Family History  Problem Relation Age of Onset   Diabetes Mother    Stroke Mother    Heart failure Father    Heart attack Father    Diabetes Sister        multiple siblings   Diabetes Brother        muliple siblings   Heart disease Brother    Colon cancer Neg Hx    Esophageal cancer Neg Hx    Rectal cancer Neg Hx    Stomach cancer Neg Hx    Past Surgical History:  Procedure Laterality Date   AMPUTATION Right 01/31/2016   Procedure: Right 2nd Toe Amputation;  Surgeon: Newt Minion, MD;  Location: [facility];  Service: Orthopedics;  Laterality: Right;    AMPUTATION Right 07/08/2018   Procedure: RIGHT TRANSMETATARSAL AMPUTATION;  Surgeon: Newt Minion, MD;  Location: [facility];  Service: Orthopedics;  Laterality: Right;   AMPUTATION Right 01/05/2020   Procedure: RIGHT BELOW KNEE AMPUTATION;  Surgeon: Newt Minion, MD;  Location: [facility];  Service: Orthopedics;  Laterality: Right;   AMPUTATION Right 02/23/2020   Procedure: RIGHT BELOW KNEE AMPUTATION REVISION;  Surgeon: Newt Minion, MD;  Location: [facility];  Service: Orthopedics;  Laterality: Right;   CATARACT EXTRACTION     right eye   COLONOSCOPY     CORONARY STENT INTERVENTION N/A 07/12/2018   Procedure: CORONARY STENT INTERVENTION;  Surgeon: Troy Sine, MD;  Location: [facility];  Service: Cardiovascular;  Laterality: N/A;   CORONARY/GRAFT ACUTE MI REVASCULARIZATION N/A 07/12/2018   Procedure: Coronary/Graft Acute MI Revascularization;  Surgeon: Troy Sine, MD;  Location: [facility];  Service: Cardiovascular;  Laterality: N/A;   I & D EXTREMITY  04/11/2012   Procedure: IRRIGATION AND DEBRIDEMENT EXTREMITY;  Surgeon: Wylene Simmer, MD;  Location: [facility];  Service: Orthopedics;  Laterality: Right;  LEFT HEART CATH AND CORONARY ANGIOGRAPHY N/A 07/12/2018   Procedure: LEFT HEART CATH AND CORONARY ANGIOGRAPHY;  Surgeon: Troy Sine, MD;  Location: [facility];  Service: Cardiovascular;  Laterality: N/A;   Surgery left great toe     Tear ducts bilateral eyes     TRANSMETATARSAL AMPUTATION Right 07/08/2018    Short Social History:  Social History   Tobacco Use   Smoking status: Former    Packs/day: 0.30    Years: 48.00    Total pack years: 14.40    Types: Cigars, Cigarettes    Start date: 07/02/1963    Quit date: 06/01/2019    Years since quitting: 3.1   Smokeless tobacco: Former  Substance Use Topics   Alcohol use: Yes    Alcohol/week: 0.0 standard drinks of alcohol    Allergies  Allergen Reactions   Codeine Other (See Comments)    Heart attack.     Current Outpatient Medications  Medication Sig Dispense Refill   aspirin 81 MG chewable tablet Chew 81 mg by mouth daily.     Baclofen 5 MG TABS Take 5 mg by mouth 3 (three) times daily as needed. 20 tablet 0   calcium carbonate (OSCAL) 1500 (600 Ca) MG TABS tablet Take 600 mg of elemental calcium by mouth daily.     Cholecalciferol (VITAMIN D3) 50 MCG (2000 UT) TABS Take 2,000 Units by mouth daily.     CINNAMON PO Take 1 tablet by mouth daily.     diclofenac Sodium (VOLTAREN) 1 % GEL Apply 2 g topically 3 (three) times daily as needed. 100 g 0   Dulaglutide (TRULICITY) 1.5 QI/2.9NL SOPN Inject 1.5 mg into the skin once a week. 2 mL 3   famotidine (PEPCID) 20 MG tablet Take 20 mg by mouth daily.     furosemide (LASIX) 20 MG tablet Take 1 tablet (20 mg total) by mouth 2 (two) times daily. 180 tablet 3   gabapentin (NEURONTIN) 100 MG capsule Take 1 capsule (100 mg total) by mouth 2 (two) times daily. 60 capsule 2   Guaifenesin 100 MG PACK Take 1 each by mouth daily.     insulin aspart (NOVOLOG) 100 UNIT/ML injection Inject 30 Units into the skin 2 (two) times daily before a meal.     insulin glargine (LANTUS) 100 UNIT/ML injection Inject 25 Units into the skin 2 (two) times daily.     metFORMIN (GLUCOPHAGE) 500 MG tablet TAKE 1 TABLET BY MOUTH 2 TIMES DAILY WITH A MEAL. 180 tablet 1   metoprolol tartrate (LOPRESSOR) 25 MG tablet Take 25 mg by mouth 2 (two) times daily.     Multiple Vitamin (MULTIVITAMIN WITH MINERALS) TABS tablet Take 1 tablet by mouth daily.     rosuvastatin (CRESTOR) 40 MG tablet Take 1 tablet (40 mg total) by mouth daily at 6 PM.     sildenafil (VIAGRA) 100 MG tablet Take 100 mg by mouth daily as needed for erectile dysfunction.     tamsulosin (FLOMAX) 0.4 MG CAPS capsule Take 1 capsule (0.4 mg total) by mouth daily. 30 capsule 3   tiZANidine (ZANAFLEX) 4 MG capsule Take 1 capsule (4 mg total) by mouth 3 (three) times daily. 15 capsule 0   Zoster Vaccine Adjuvanted  [facility] injection Administer Shingrix vaccination now and repeat in two months 1 each 1   No current facility-administered medications for this visit.    Review of Systems  Constitutional:  Constitutional negative. HENT: HENT negative.  Eyes: Eyes negative.  Cardiovascular: Positive for leg swelling.  GI: Gastrointestinal negative.  Musculoskeletal: Positive for leg pain.  Hematologic: Hematologic/lymphatic negative.  Psychiatric: Psychiatric negative.        Objective:  Objective   Vitals:   08/07/22 1417  BP: 114/62  Pulse: 68  Resp: 20  Temp: 98 F (36.7 C)  SpO2: 93%  Weight: 179 lb (81.2 kg)  Height: '5\' 10"'$  (1.778 m)   Body mass index is 25.68 kg/m.  Physical Exam HENT:     Head: Normocephalic.     Nose: Nose normal.  Eyes:     Pupils: Pupils are equal, round, and reactive to light.  Cardiovascular:     Rate and Rhythm: Normal rate.  Pulmonary:     Effort: Pulmonary effort is normal.  Abdominal:     General: Abdomen is flat.  Musculoskeletal:     Left lower leg: Edema present.     Comments: Right lower extremity prosthesis and he is wearing a left lower extremity compression sock  Neurological:     Mental Status: He is alert.  Psychiatric:        Mood and Affect: Mood normal.     Data: LEFT          Reflux NoRefluxReflux TimeDiameter cmsComments                          Yes                                   +--------------+---------+------+-----------+------------+--------+  CFV          no                                              +--------------+---------+------+-----------+------------+--------+  FV prox       no                                              +--------------+---------+------+-----------+------------+--------+  FV mid        no                                              +--------------+---------+------+-----------+------------+--------+  FV dist       no                                               +--------------+---------+------+-----------+------------+--------+  Popliteal    no                                              +--------------+---------+------+-----------+------------+--------+  GSV at [facility]    no                            0.77              +--------------+---------+------+-----------+------------+--------+  GSV prox thighno                            0.69              +--------------+---------+------+-----------+------------+--------+  GSV mid thigh no                            0.40              +--------------+---------+------+-----------+------------+--------+  GSV dist thighno                            0.37              +--------------+---------+------+-----------+------------+--------+  GSV at knee   no                            0.34              +--------------+---------+------+-----------+------------+--------+  GSV prox calf           yes    >500 ms      0.32              +--------------+---------+------+-----------+------------+--------+  SSV Pop Fossa no                            0.26              +--------------+---------+------+-----------+------------+--------+  SSV prox calf no                            0.23              +--------------+---------+------+-----------+------------+--------+    Summary Left:  - No evidence of deep vein thrombosis seen in the left lower extremity,  from the common femoral through the popliteal veins.  - No evidence of superficial venous thrombosis in the left lower  extremity.  - No evidence of superficial venous reflux seen in the left short  saphenous vein.  - Venous reflux is noted in the left greater saphenous vein in the calf.     CT IMPRESSION: VASCULAR   1. Mild atherosclerotic disease in the abdominal aorta and iliac arteries. No evidence for aneurysm, dissection or significant stenosis involving the abdominopelvic arterial  structures. 2. Limited evaluation of the veins due to the arterial phase of contrast. However, the IVC and iliac veins are very similar to the exam on 09/12/2021. No significant compression on the left common iliac vein. 3. Left common femoral vein is slightly enlarged. Previous lower extremity venous duplex on 07/03/2022 was negative for DVT but if there is clinical concern for DVT recommend a repeat ultrasound examination.        Assessment/Plan:    67 year old male here for evaluation of significant left lower extremity edema that is symptomatic.  He does not have significant reflux on recent evaluation and there is no evidence of May Thurner on CT scan which we reviewed together today.  I recommended tighter compression stockings and we will fit these for him today and he can follow-up with me on an as-needed basis.     Waynetta Sandy MD [facility]

## 2022-08-14 ENCOUNTER — Ambulatory Visit: Payer: 59 | Admitting: Vascular Surgery

## 2022-08-15 ENCOUNTER — Ambulatory Visit: Payer: 59 | Admitting: Orthopedic Surgery

## 2022-08-15 ENCOUNTER — Ambulatory Visit (INDEPENDENT_AMBULATORY_CARE_PROVIDER_SITE_OTHER): Payer: 59 | Admitting: Orthopedic Surgery

## 2022-08-15 DIAGNOSIS — Z89511 Acquired absence of right leg below knee: Secondary | ICD-10-CM

## 2022-08-15 DIAGNOSIS — L97529 Non-pressure chronic ulcer of other part of left foot with unspecified severity: Secondary | ICD-10-CM

## 2022-08-15 DIAGNOSIS — I890 Lymphedema, not elsewhere classified: Secondary | ICD-10-CM | POA: Diagnosis not present

## 2022-08-15 DIAGNOSIS — I87323 Chronic venous hypertension (idiopathic) with inflammation of bilateral lower extremity: Secondary | ICD-10-CM

## 2022-08-15 DIAGNOSIS — E11621 Type 2 diabetes mellitus with foot ulcer: Secondary | ICD-10-CM

## 2022-08-15 DIAGNOSIS — I872 Venous insufficiency (chronic) (peripheral): Secondary | ICD-10-CM

## 2022-08-16 ENCOUNTER — Encounter: Payer: Self-pay | Admitting: Orthopedic Surgery

## 2022-08-16 NOTE — Progress Notes (Signed)
Office Visit Note   Patient: Adam Ray           Date of Birth: 1956-06-19           MRN: [id number] Visit Date: 08/15/2022              Requested by: [name], MD [address] [place],  [place] [address] PCP: [name], MD  Chief Complaint  Patient presents with   Left Leg - Edema      HPI: Patient is a 67 year old gentleman who presents in follow-up for right below-knee amputation and lymphedema and venous insufficiency left leg.  Patient states the swelling has progressively gotten worse.  Also complains of a Wagner grade 1 ulcer on the plantar aspect the left great toe.  Assessment & Plan: Visit Diagnoses:  1. 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 Acquired absence of right leg below knee (HCC)   3. Idiopathic chronic venous hypertension of both lower extremities with inflammation     Plan: Patient is trialed today for lymphedema pumps.  Ulcer debrided x 1.  Follow-Up Instructions: Return in about 4 weeks (around 09/12/2022).   Ortho Exam  Patient is alert, oriented, no adenopathy, well-dressed, normal affect, normal respiratory effort. Examination patient is a stable right transtibial amputation left leg shows increased swelling with pitting edema.  The calf measures 41 cm in circumference the ankle is 34 cm in circumference.  He has a Wagner grade 1 ulcer on the plantar aspect of the left great toe.  After informed consent a 10 blade knife was used to debride the skin and soft tissue back to healthy viable tissue.  The ulcer is 3 cm in diameter and 1 mm deep after debridement.  Imaging: No results found. No images are attached to the encounter.  Labs: Lab Results  Component Value Date   HGBA1C 7.9 (A) 07/05/2022   HGBA1C 9.4 (H) 01/17/2022   HGBA1C 7.8 (A) 09/14/2021   ESRSEDRATE 54 (H) 01/01/2020   ESRSEDRATE 8 01/29/2016   ESRSEDRATE 9 04/11/2012   CRP 2.6 (H) 01/29/2016   CRP 0.5 02/28/2014   REPTSTATUS 01/18/2020  FINAL 01/17/2020   REPTSTATUS 01/22/2020 FINAL 01/17/2020   REPTSTATUS 01/22/2020 FINAL 01/17/2020   GRAMSTAIN  04/11/2012    NO WBC SEEN RARE SQUAMOUS EPITHELIAL CELLS PRESENT MODERATE GRAM POSITIVE COCCI IN PAIRS RARE GRAM NEGATIVE RODS   GRAMSTAIN  04/11/2012    NO WBC SEEN RARE SQUAMOUS EPITHELIAL CELLS PRESENT MODERATE GRAM POSITIVE COCCI IN PAIRS RARE GRAM NEGATIVE RODS   CULT (A) 01/17/2020    <10,000 COLONIES/mL INSIGNIFICANT GROWTH Performed at [facility], [address], [place], [place] [address]    CULT  01/17/2020    NO GROWTH 5 DAYS Performed at [facility] [address], [place], [place] [address]    CULT  01/17/2020    NO GROWTH 5 DAYS Performed at [facility] [address] [place], [place] [address]    LABORGA NO GROWTH 2 DAYS 07/19/2014     Lab Results  Component Value Date   ALBUMIN 4.5 01/17/2022   ALBUMIN 4.1 12/06/2020   ALBUMIN 4.3 12/01/2020    No results found for: "MG" No results found for: "VD25OH"  No results found for: "PREALBUMIN"    Latest Ref Rng & Units 01/17/2022    3:14 PM 12/06/2020    4:42 PM 12/01/2020    3:51 PM  CBC EXTENDED  WBC 3.4 -  10.8 x10E3/uL 3.8  5.5  6.3   RBC 4.14 - 5.80 x10E6/uL 4.64  4.90  4.66   Hemoglobin 13.0 - 17.7 g/dL 13.5  13.7  13.1   HCT 37.5 - 51.0 % 39.6  39.8  38.8   Platelets 150 - 450 x10E3/uL 104  138  104.0   NEUT# 1.4 - 7.7 K/uL   4.8   Lymph# 0.7 - 4.0 K/uL   0.9      There is no height or weight on file to calculate BMI.  Orders:  No orders of the defined types were placed in this encounter.  No orders of the defined types were placed in this encounter.    Procedures: No procedures performed  Clinical Data: No additional findings.  ROS:  All other systems negative, except as noted in the HPI. Review of Systems  Objective: Vital Signs: There were no vitals taken for this visit.  Specialty Comments:  No specialty comments available.  PMFS  History: Patient Active Problem List   Diagnosis Date Noted   Tongue discoloration 02/14/2022   Hematuria 08/16/2021   Illness 12/06/2020   Urinary incontinence 08/10/2020   Blurred vision, bilateral 07/13/2020   Increased urinary frequency 05/09/2020   Dehiscence of amputation stump ([place])    Coronary artery disease 01/18/2020   Nocturnal leg cramps 10/28/2019   Hyperlipidemia 05/26/2019   Edema leg 12/29/2018   STEMI involving left circumflex coronary artery ([place]) 07/12/2018   Status post transmetatarsal amputation of foot, right ([place]) 07/08/2018   Limited mobility 05/12/2018   Gingival foreign body 07/22/2017   Anxiety state 06/02/2017   Leg swelling 01/29/2017   Idiopathic chronic venous hypertension of both lower extremities with inflammation 09/24/2016   Testicular mass 04/18/2016   Subacute osteomyelitis, right ankle and foot ([place]) 01/29/2016   Diabetic polyneuropathy associated with type 2 diabetes mellitus ([place])    Type 2 diabetes mellitus with neurologic complication, with long-term current use of insulin ([place]) 12/08/2015   Pulmonary nodule 02/01/2015   Depression 12/09/2013   Warts, genital 08/20/2013   Diabetic retinopathy ([place]) 01/28/2013   Sleep apnea 09/06/2010   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) 10/06/2006   SICKLE-CELL TRAIT 04/26/2006   ERECTILE DYSFUNCTION 04/26/2006   Tobacco use 04/26/2006   PAD (peripheral artery disease) ([place]) 04/26/2006   ALLERGIC RHINITIS 04/26/2006   Past Medical History:  Diagnosis Date   Allergy    Arthritis    Bronchitis    Cataract    CHF (congestive heart failure) ([place])    Chronic kidney disease    Claudication ([place])    right foot ray resection   Colon polyps    hyperplastic   COPD (chronic obstructive pulmonary disease) ([place])    Coronary artery disease    Diabetes mellitus    type II   Genital warts    Gout    Hyperlipidemia    Hypertension    Myocardial  infarction ([place])    Osteomyelitis of third toe of right foot ([place])    Pneumonia    Status post amputation of toe of right foot ([place]) 09/24/2016    Family History  Problem Relation Age of Onset   Diabetes Mother    Stroke Mother    Heart failure Father    Heart attack Father    Diabetes Sister        multiple siblings   Diabetes Brother        muliple siblings  Heart disease Brother    Colon cancer Neg Hx    Esophageal cancer Neg Hx    Rectal cancer Neg Hx    Stomach cancer Neg Hx     Past Surgical History:  Procedure Laterality Date   AMPUTATION Right 01/31/2016   Procedure: Right 2nd Toe Amputation;  Surgeon: Newt Minion, MD;  Location: [place];  Service: Orthopedics;  Laterality: Right;   AMPUTATION Right 07/08/2018   Procedure: RIGHT TRANSMETATARSAL AMPUTATION;  Surgeon: Newt Minion, MD;  Location: [facility];  Service: Orthopedics;  Laterality: Right;   AMPUTATION Right 01/05/2020   Procedure: RIGHT BELOW KNEE AMPUTATION;  Surgeon: Newt Minion, MD;  Location: [facility];  Service: Orthopedics;  Laterality: Right;   AMPUTATION Right 02/23/2020   Procedure: RIGHT BELOW KNEE AMPUTATION REVISION;  Surgeon: Newt Minion, MD;  Location: [facility];  Service: Orthopedics;  Laterality: Right;   CATARACT EXTRACTION     right eye   COLONOSCOPY     CORONARY STENT INTERVENTION N/A 07/12/2018   Procedure: CORONARY STENT INTERVENTION;  Surgeon: Troy Sine, MD;  Location: [facility];  Service: Cardiovascular;  Laterality: N/A;   CORONARY/GRAFT ACUTE MI REVASCULARIZATION N/A 07/12/2018   Procedure: Coronary/Graft Acute MI Revascularization;  Surgeon: Troy Sine, MD;  Location: [place] [place] [facility];  Service: Cardiovascular;  Laterality: N/A;   I & D EXTREMITY  04/11/2012   Procedure: IRRIGATION AND DEBRIDEMENT EXTREMITY;  Surgeon: Wylene Simmer, MD;  Location: [place];  Service: Orthopedics;  Laterality: Right;   LEFT HEART CATH AND CORONARY ANGIOGRAPHY N/A 07/12/2018   Procedure: LEFT HEART  CATH AND CORONARY ANGIOGRAPHY;  Surgeon: Troy Sine, MD;  Location: [facility] [facility];  Service: Cardiovascular;  Laterality: N/A;   Surgery left great toe     Tear ducts bilateral eyes     TRANSMETATARSAL AMPUTATION Right 07/08/2018   Social History   Occupational History   Occupation: disabled  Tobacco Use   Smoking status: Former    Packs/day: 0.30    Years: 48.00    Total pack years: 14.40    Types: Cigars, Cigarettes    Start date: 07/02/1963    Quit date: 06/01/2019    Years since quitting: 3.2   Smokeless tobacco: Former  Scientific laboratory technician Use: Former  Substance and Sexual Activity   Alcohol use: Yes    Alcohol/week: 0.0 standard drinks of alcohol   Drug use: No   Sexual activity: Not on file

## 2022-08-23 ENCOUNTER — Ambulatory Visit (INDEPENDENT_AMBULATORY_CARE_PROVIDER_SITE_OTHER): Payer: No Typology Code available for payment source

## 2022-08-23 DIAGNOSIS — Z0000 Encounter for general adult medical examination without abnormal findings: Secondary | ICD-10-CM | POA: Diagnosis not present

## 2022-08-23 DIAGNOSIS — Z5982 Transportation insecurity: Secondary | ICD-10-CM | POA: Diagnosis not present

## 2022-08-23 DIAGNOSIS — Z5941 Food insecurity: Secondary | ICD-10-CM | POA: Diagnosis not present

## 2022-08-23 NOTE — Progress Notes (Signed)
I connected with  Adam Ray on 08/23/22 by a audio enabled telemedicine application and verified that I am speaking with the correct person using two identifiers.  Patient Location: Home  Provider Location: Home Office  I discussed the limitations of evaluation and management by telemedicine. The patient expressed understanding and agreed to proceed.   Subjective:   Adam Ray is a 67 y.o. male who presents for an Initial Medicare Annual Wellness Visit.  Review of Systems    Per HPI unless specifically indicated below.  Cardiac Risk Factors include: advanced age (>85mn, >>94women);male gender, Essential Hypertension, CHF, and CAD.          Objective:       08/07/2022    2:17 PM 07/26/2022    1:34 PM 07/07/2022    2:50 PM  Vitals with BMI  Height '5\' 10"'$  '5\' 10"'$    Weight 179 lbs 179 lbs 6 oz   BMI [id number]  Systolic [id number] Diastolic 62 70 79  Pulse 68 67 73    There were no vitals filed for this visit. There is no height or weight on file to calculate BMI.     04/01/2022   10:07 AM 03/14/2022    9:36 AM 02/14/2022    9:44 AM 01/17/2022    2:12 PM 12/13/2021   10:58 AM 09/14/2021    1:56 PM 08/30/2021    3:43 PM  Advanced Directives  Does Patient Have a Medical Advance Directive? No No No No No No No  Would patient like information on creating a medical advance directive? No - Patient declined No - Patient declined No - Patient declined No - Patient declined No - Patient declined No - Patient declined No - Patient declined    Current Medications (verified) Outpatient Encounter Medications as of 08/23/2022  Medication Sig   aspirin 81 MG chewable tablet Chew 81 mg by mouth daily.   Baclofen 5 MG TABS Take 5 mg by mouth 3 (three) times daily as needed.   calcium carbonate (OSCAL) 1500 (600 Ca) MG TABS tablet Take 600 mg of elemental calcium by mouth daily.   Cholecalciferol (VITAMIN D3) 50 MCG (2000 UT) TABS Take 2,000 Units by mouth daily.   CINNAMON PO Take  1 tablet by mouth daily.   Dulaglutide (TRULICITY) 1.5 M0000000SOPN Inject 1.5 mg into the skin once a week.   famotidine (PEPCID) 20 MG tablet Take 20 mg by mouth daily.   furosemide (LASIX) 20 MG tablet Take 1 tablet (20 mg total) by mouth 2 (two) times daily.   gabapentin (NEURONTIN) 100 MG capsule Take 1 capsule (100 mg total) by mouth 2 (two) times daily.   Guaifenesin 100 MG PACK Take 1 each by mouth daily.   insulin aspart (NOVOLOG) 100 UNIT/ML injection Inject 30 Units into the skin 2 (two) times daily before a meal.   insulin glargine (LANTUS) 100 UNIT/ML injection Inject 25 Units into the skin 2 (two) times daily.   metFORMIN (GLUCOPHAGE) 500 MG tablet TAKE 1 TABLET BY MOUTH 2 TIMES DAILY WITH A MEAL.   metoprolol tartrate (LOPRESSOR) 25 MG tablet Take 25 mg by mouth 2 (two) times daily.   Multiple Vitamin (MULTIVITAMIN WITH MINERALS) TABS tablet Take 1 tablet by mouth daily.   rosuvastatin (CRESTOR) 40 MG tablet Take 1 tablet (40 mg total) by mouth daily at 6 PM.   sildenafil (VIAGRA) 100 MG tablet Take 100 mg by mouth daily as needed for  erectile dysfunction.   tamsulosin (FLOMAX) 0.4 MG CAPS capsule Take 1 capsule (0.4 mg total) by mouth daily.   tiZANidine (ZANAFLEX) 4 MG capsule Take 1 capsule (4 mg total) by mouth 3 (three) times daily.   Zoster Vaccine Adjuvanted [facility]) injection Administer Shingrix vaccination now and repeat in two months   diclofenac Sodium (VOLTAREN) 1 % GEL Apply 2 g topically 3 (three) times daily as needed. (Patient not taking: Reported on 08/23/2022)   [DISCONTINUED] levofloxacin (LEVAQUIN) 750 MG tablet Take 1 tablet (750 mg total) by mouth daily. For 14 days   No facility-administered encounter medications on file as of 08/23/2022.    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   Past Surgical History:  Procedure Laterality Date   AMPUTATION Right 01/31/2016   Procedure: Right 2nd Toe Amputation;  Surgeon: Newt Minion, MD;  Location: [place];  Service: Orthopedics;  Laterality: Right;   AMPUTATION Right 07/08/2018   Procedure: RIGHT TRANSMETATARSAL AMPUTATION;  Surgeon: Newt Minion, MD;  Location: [place];  Service: Orthopedics;  Laterality: Right;   AMPUTATION Right 01/05/2020   Procedure: RIGHT BELOW KNEE AMPUTATION;  Surgeon: Newt Minion, MD;  Location: [place];  Service: Orthopedics;  Laterality: Right;   AMPUTATION Right 02/23/2020   Procedure: RIGHT BELOW KNEE AMPUTATION REVISION;  Surgeon: Newt Minion, MD;  Location: [place];  Service: Orthopedics;  Laterality: Right;   CATARACT EXTRACTION     right eye   COLONOSCOPY     CORONARY STENT INTERVENTION N/A 07/12/2018   Procedure: CORONARY STENT INTERVENTION;  Surgeon: Troy Sine, MD;  Location: [place] [facility];  Service: Cardiovascular;  Laterality: N/A;   CORONARY/GRAFT ACUTE MI REVASCULARIZATION N/A 07/12/2018   Procedure: Coronary/Graft Acute MI Revascularization;  Surgeon: Troy Sine, MD;  Location: [place] [facility];  Service: Cardiovascular;  Laterality: N/A;   I & D EXTREMITY  04/11/2012   Procedure: IRRIGATION AND DEBRIDEMENT EXTREMITY;  Surgeon: Wylene Simmer, MD;  Location: [place];  Service: Orthopedics;  Laterality: Right;   LEFT HEART CATH AND CORONARY ANGIOGRAPHY N/A 07/12/2018   Procedure: LEFT HEART CATH AND CORONARY ANGIOGRAPHY;  Surgeon: Troy Sine, MD;  Location: [place]  [facility];  Service: Cardiovascular;  Laterality: N/A;   Surgery left great toe     Tear ducts bilateral eyes     TRANSMETATARSAL AMPUTATION Right 07/08/2018   Family History  Problem Relation Age of Onset   Diabetes Mother    Stroke Mother    Heart failure Father    Heart attack Father    Diabetes Sister        multiple siblings   Diabetes Brother        muliple siblings   Heart disease  Brother    Colon cancer Neg Hx    Esophageal cancer Neg Hx    Rectal cancer Neg Hx    Stomach cancer Neg Hx    Social History   Socioeconomic History   Marital status: Married    Spouse name: [name]   Number of children: 3   Years of education: Not on file   Highest education level: Not on file  Occupational History   Occupation: disabled  Tobacco Use   Smoking status: Former    Packs/day: 0.30    Years: 48.00    Total pack years: 14.40    Types: Cigars, Cigarettes    Start date: 07/02/1963    Quit date: 06/01/2019    Years since quitting: 3.2   Smokeless tobacco: Former  Scientific laboratory technician Use: Former  Substance and Sexual Activity   Alcohol use: Yes    Alcohol/week: 0.0 standard drinks of alcohol   Drug use: No   Sexual activity: Not on file  Other Topics Concern   Not on file  Social History Narrative   Not on file   Social Determinants of Health   Financial Resource Strain: Medium Risk (08/23/2022)   Overall Financial Resource Strain (CARDIA)    Difficulty of Paying Living Expenses: Somewhat hard  Food Insecurity: Food Insecurity Present (08/23/2022)   Hunger Vital Sign    Worried About Running Out of Food in the Last Year: Sometimes true    Ran Out of Food in the Last Year: Never true  Transportation Needs: No Transportation Needs (08/23/2022)   PRAPARE - Hydrologist (Medical): No    Lack of Transportation (Non-Medical): No  Physical Activity: Insufficiently Active (08/23/2022)   Exercise Vital Sign    Days of Exercise per Week: 2 days     Minutes of Exercise per Session: 60 min  Stress: No Stress Concern Present (08/23/2022)   [place]    Feeling of Stress : Not at all  Social Connections: Moderately Integrated (08/23/2022)   Social Connection and Isolation Panel [NHANES]    Frequency of Communication with Friends and Family: Once a week    Frequency of Social Gatherings with Friends and Family: Never    Attends Religious Services: More than 4 times per year    Active Member of [employer] or Organizations: Yes    Attends Archivist Meetings: 1 to 4 times per year    Marital Status: Married    Tobacco Counseling Counseling given: No   Clinical Intake:  Pre-visit preparation completed: No  Pain : No/denies pain     Nutritional Status: BMI of 19-24  Normal Nutritional Risks: Non-healing wound Diabetes: Yes CBG done?: Yes CBG resulted in Enter/ Edit results?: No Did pt. bring in CBG monitor from home?: No  How often do you need to have someone help you when you read instructions, pamphlets, or other written materials from your doctor or pharmacy?: 1 - Never  Diabetic?Nutrition Risk Assessment:  Has the patient had any N/V/D within the last 2 months?  No  Does the patient have any non-healing wounds?  No  Has the patient had any unintentional weight loss or weight gain?  No   Diabetes:  Is the patient diabetic?  Yes  If diabetic, was a CBG obtained today?  Yes  Did the patient bring in their glucometer from home?  Yes  How often do you monitor your CBG's? Three time  a day.   Financial Strains and Diabetes Management:  Are you having any financial strains with the device, your supplies or your medication? No .  Does the patient want to be seen by Chronic Care Management for management of their diabetes?  No  Would the patient like to be referred to a Nutritionist or for Diabetic Management?  No   Diabetic Exams:  Diabetic Eye Exam:  Completed 10/18/2021 Diabetic Foot Exam: Completed 02/13/2022    Interpreter Needed?: No  Information entered by :: Donnie [place], [place]   Activities of Daily Living    08/23/2022    2:38 PM  In your present state of health, do you have any difficulty performing the following activities:  Hearing? 1  Vision? 1  Difficulty concentrating or making decisions? 1  Walking or climbing stairs? 1  Dressing or bathing? 0  Doing errands, shopping? 0    Patient Care Team: [name], MD as PCP - General (Family Medicine) Lorretta Harp, MD as PCP - Cardiology (Cardiology)  Indicate any recent Medical Services you may have received from other than [facility] providers in the past year (date may be approximate).     Assessment:   This is a routine wellness examination for [place].  Hearing/Vision screen Admits to some hearing loss.  Admits to some vision issues. Annual Eye Exam, [facility]  Dietary issues and exercise activities discussed: Current Exercise Habits: Structured exercise class, Type of exercise: strength training/weights, Time (Minutes): 60, Frequency (Times/Week): 3, Weekly Exercise (Minutes/Week): 180, Intensity: Mild, Exercise limited by: cardiac condition(s)   Goals Addressed   None    Depression Screen    08/23/2022    2:37 PM 04/01/2022   12:20 PM 03/14/2022   10:00 AM 02/14/2022    9:44 AM 02/11/2022    4:03 PM 02/04/2022   10:42 AM 01/17/2022    2:10 PM  PHQ 2/9 Scores  PHQ - 2 Score 0 0 0 0 0  0  PHQ- 9 Score  5 0 0 0  0     Information is confidential and restricted. Go to Review Flowsheets to unlock data.    Fall Risk    08/23/2022    2:38 PM 04/01/2022   10:07 AM 03/14/2022   10:02 AM 03/14/2022   10:00 AM 02/14/2022    9:44 AM  Fall Risk   Falls in the past year? 0 0  0 0  Number falls in past yr: 0 0 0  0  Injury with Fall? 0 0 0  0  Risk for fall due to : No Fall Risks      Follow up Falls evaluation completed        FALL RISK  PREVENTION PERTAINING TO THE HOME:  Any stairs in or around the home? No  If so, are there any without handrails? No  Home free of loose throw rugs in walkways, pet beds, electrical cords, etc? Yes  Adequate lighting in your home to reduce risk of falls? Yes   ASSISTIVE DEVICES UTILIZED TO PREVENT FALLS:  Life alert? No  Use of a cane, walker or w/c? Yes  Grab bars in the bathroom? Yes  Shower chair or bench in shower? No  Elevated toilet seat or a handicapped toilet? No   TIMED UP AND GO:  Was the test performed? Unable to perform, virtual appointment   Cognitive Function:        08/23/2022    2:39 PM  6CIT Screen  What Year?  0 points  What month? 0 points  What time? 0 points  Count back from 20 0 points  Months in reverse 4 points  Repeat phrase 10 points  Total Score 14 points    Immunizations Immunization History  Administered Date(s) Administered   COVID-19, mRNA, vaccine(Comirnaty)12 years and older 07/05/2022   Fluad Quad(high Dose 65+) 03/27/2020, 06/12/2021, 04/01/2022   Influenza Split 06/11/2011   Influenza Whole 06/01/2008, 04/28/2009   Influenza, Quadrivalent, Recombinant, Inj, Pf 05/17/2019   Influenza,inj,Quad PF,6+ Mos 04/06/2013, 03/25/2014, 06/12/2015, 03/15/2016, 03/21/2017, 05/11/2018   Influenza-Unspecified 03/28/2012   PFIZER Comirnaty(Gray Top)Covid-19 Tri-Sucrose Vaccine 12/28/2020   PFIZER(Purple Top)SARS-COV-2 Vaccination 08/29/2019, 09/28/2019, 07/05/2020   PNEUMOCOCCAL CONJUGATE-20 06/12/2021   Pfizer Covid-19 Vaccine Bivalent Booster 35yr & up 10/09/2021   Pneumococcal Conjugate-13 06/12/2015   Pneumococcal Polysaccharide-23 06/01/2009, 03/15/2016, 01/24/2022   Td 08/23/2008   Tdap 05/31/2019   Zoster, Live 09/15/2015    TDAP status: Up to date  Flu Vaccine status: Up to date  Pneumococcal vaccine status: Up to date  Covid-19 vaccine status: Information provided on how to obtain vaccines.   Qualifies for Shingles Vaccine?  Yes   Zostavax completed No   Shingrix Completed?: No.    Education has been provided regarding the importance of this vaccine. Patient has been advised to call insurance company to determine out of pocket expense if they have not yet received this vaccine. Advised may also receive vaccine at local pharmacy or Health Dept. Verbalized acceptance and understanding.  Screening Tests Health Maintenance  Topic Date Due   Zoster Vaccines- Shingrix (2 of 2) 03/21/2022   COVID-19 Vaccine (7 - 2023-24 season) 08/30/2022   OPHTHALMOLOGY EXAM  10/19/2022   HEMOGLOBIN A1C  01/03/2023   LIPID PANEL  01/18/2023   FOOT EXAM  02/14/2023   Diabetic kidney evaluation - eGFR measurement  03/15/2023   Diabetic kidney evaluation - Urine ACR  04/02/2023   Medicare Annual Wellness (AWV)  08/24/2023   COLONOSCOPY (Pts 45-448yrInsurance coverage will need to be confirmed)  07/11/2026   DTaP/Tdap/Td (3 - Td or Tdap) 05/30/2029   Pneumonia Vaccine 6588Years old  Completed   INFLUENZA VACCINE  Completed   Hepatitis C Screening  Completed   HPV VACCINES  Aged Out    Health Maintenance  Health Maintenance Due  Topic Date Due   Zoster Vaccines- Shingrix (2 of 2) 03/21/2022    Colorectal cancer screening: Type of screening: Colonoscopy. Completed 07/11/2016. Repeat every 10 years  Lung Cancer Screening: (Low Dose CT Chest recommended if Age 474-80ears, 30 pack-year currently smoking OR have quit w/in 15years.) does not qualify.   Lung Cancer Screening Referral: not applicable   Additional Screening:  Hepatitis C Screening: does qualify; Completed 10/28/2012  Vision Screening: Recommended annual ophthalmology exams for early detection of glaucoma and other disorders of the eye. Is the patient up to date with their annual eye exam?  Yes  Who is the provider or what is the name of the office in which the patient attends annual eye exams? Dr. [facility]Alanda Slimt [facility] [place]  If pt is not  established with a provider, would they like to be referred to a provider to establish care? No .   Dental Screening: Recommended annual dental exams for proper oral hygiene  Community Resource Referral / Chronic Care Management: CRR required this visit?  No   CCM required this visit?  No      Plan:     I have personally  reviewed and noted the following in the patient's chart:   Medical and social history Use of alcohol, tobacco or illicit drugs  Current medications and supplements including opioid prescriptions. Patient is not currently taking opioid prescriptions. Functional ability and status Nutritional status Physical activity Advanced directives List of other physicians Hospitalizations, surgeries, and ER visits in previous 12 months Vitals Screenings to include cognitive, depression, and falls Referrals and appointments  In addition, I have reviewed and discussed with patient certain preventive protocols, quality metrics, and best practice recommendations. A written personalized care plan for preventive services as well as general preventive health recommendations were provided to patient.   Mr. Sharpley , Thank you for taking time to come for your Medicare Wellness Visit. I appreciate your ongoing commitment to your health goals. Please review the following plan we discussed and let me know if I can assist you in the future.   These are the goals we discussed:  Goals   None     This is a list of the screening recommended for you and due dates:  Health Maintenance  Topic Date Due   Zoster (Shingles) Vaccine (2 of 2) 03/21/2022   COVID-19 Vaccine (7 - 2023-24 season) 08/30/2022   Eye exam for diabetics  10/19/2022   Hemoglobin A1C  01/03/2023   Lipid (cholesterol) test  01/18/2023   Complete foot exam   02/14/2023   Yearly kidney function blood test for diabetes  03/15/2023   Yearly kidney health urinalysis for diabetes  04/02/2023   Medicare Annual Wellness Visit   08/24/2023   Colon Cancer Screening  07/11/2026   DTaP/Tdap/Td vaccine (3 - Td or Tdap) 05/30/2029   Pneumonia Vaccine  Completed   Flu Shot  Completed   Hepatitis C Screening: USPSTF Recommendation to screen - Ages 59-79 yo.  Completed   HPV Vaccine  Aged [address] [place]   08/23/2022   Nurse Notes: Approximately 30 minute Non-Face -To-Face Medicare Wellness Visit

## 2022-08-23 NOTE — Patient Instructions (Signed)
Health Maintenance, Male Adopting a healthy lifestyle and getting preventive care are important in promoting health and wellness. Ask your health care provider about: The right schedule for you to have regular tests and exams. Things you can do on your own to prevent diseases and keep yourself healthy. What should I know about diet, weight, and exercise? Eat a healthy diet  Eat a diet that includes plenty of vegetables, fruits, low-fat dairy products, and lean protein. Do not eat a lot of foods that are high in solid fats, added sugars, or sodium. Maintain a healthy weight Body mass index (BMI) is a measurement that can be used to identify possible weight problems. It estimates body fat based on height and weight. Your health care provider can help determine your BMI and help you achieve or maintain a healthy weight. Get regular exercise Get regular exercise. This is one of the most important things you can do for your health. Most adults should: Exercise for at least 150 minutes each week. The exercise should increase your heart rate and make you sweat (moderate-intensity exercise). Do strengthening exercises at least twice a week. This is in addition to the moderate-intensity exercise. Spend less time sitting. Even light physical activity can be beneficial. Watch cholesterol and blood lipids Have your blood tested for lipids and cholesterol at 68 years of age, then have this test every 5 years. You may need to have your cholesterol levels checked more often if: Your lipid or cholesterol levels are high. You are older than 68 years of age. You are at high risk for heart disease. What should I know about cancer screening? Many types of cancers can be detected early and may often be prevented. Depending on your health history and family history, you may need to have cancer screening at various ages. This may include screening for: Colorectal cancer. Prostate cancer. Skin cancer. Lung  cancer. What should I know about heart disease, diabetes, and high blood pressure? Blood pressure and heart disease High blood pressure causes heart disease and increases the risk of stroke. This is more likely to develop in people who have high blood pressure readings or are overweight. Talk with your health care provider about your target blood pressure readings. Have your blood pressure checked: Every 3-5 years if you are 18-39 years of age. Every year if you are 40 years old or older. If you are between the ages of 65 and 75 and are a current or former smoker, ask your health care provider if you should have a one-time screening for abdominal aortic aneurysm (AAA). Diabetes Have regular diabetes screenings. This checks your fasting blood sugar level. Have the screening done: Once every three years after age 45 if you are at a normal weight and have a low risk for diabetes. More often and at a younger age if you are overweight or have a high risk for diabetes. What should I know about preventing infection? Hepatitis B If you have a higher risk for hepatitis B, you should be screened for this virus. Talk with your health care provider to find out if you are at risk for hepatitis B infection. Hepatitis C Blood testing is recommended for: Everyone born from 1945 through 1965. Anyone with known risk factors for hepatitis C. Sexually transmitted infections (STIs) You should be screened each year for STIs, including gonorrhea and chlamydia, if: You are sexually active and are younger than 68 years of age. You are older than 68 years of age and your   health care provider tells you that you are at risk for this type of infection. Your sexual activity has changed since you were last screened, and you are at increased risk for chlamydia or gonorrhea. Ask your health care provider if you are at risk. Ask your health care provider about whether you are at high risk for HIV. Your health care provider  may recommend a prescription medicine to help prevent HIV infection. If you choose to take medicine to prevent HIV, you should first get tested for HIV. You should then be tested every 3 months for as long as you are taking the medicine. Follow these instructions at home: Alcohol use Do not drink alcohol if your health care provider tells you not to drink. If you drink alcohol: Limit how much you have to 0-2 drinks a day. Know how much alcohol is in your drink. In the [place] one drink equals one 12 oz bottle of beer (355 mL), one 5 oz glass of wine (148 mL), or one 1 oz glass of hard liquor (44 mL). Lifestyle Do not use any products that contain nicotine or tobacco. These products include cigarettes, chewing tobacco, and vaping devices, such as e-cigarettes. If you need help quitting, ask your health care provider. Do not use street drugs. Do not share needles. Ask your health care provider for help if you need support or information about quitting drugs. General instructions Schedule regular health, dental, and eye exams. Stay current with your vaccines. Tell your health care provider if: You often feel depressed. You have ever been abused or do not feel safe at home. Summary Adopting a healthy lifestyle and getting preventive care are important in promoting health and wellness. Follow your health care provider's instructions about healthy diet, exercising, and getting tested or screened for diseases. Follow your health care provider's instructions on monitoring your cholesterol and blood pressure. This information is not intended to replace advice given to you by your health care provider. Make sure you discuss any questions you have with your health care provider. Document Revised: 11/06/2020 Document Reviewed: 11/06/2020 Elsevier Patient Education  2023 [employer]  

## 2022-08-26 ENCOUNTER — Ambulatory Visit (INDEPENDENT_AMBULATORY_CARE_PROVIDER_SITE_OTHER): Payer: 59 | Admitting: Orthopedic Surgery

## 2022-08-26 ENCOUNTER — Encounter: Payer: Self-pay | Admitting: Orthopedic Surgery

## 2022-08-26 ENCOUNTER — Telehealth: Payer: Self-pay | Admitting: *Deleted

## 2022-08-26 ENCOUNTER — Ambulatory Visit (INDEPENDENT_AMBULATORY_CARE_PROVIDER_SITE_OTHER): Payer: 59

## 2022-08-26 DIAGNOSIS — I890 Lymphedema, not elsewhere classified: Secondary | ICD-10-CM | POA: Diagnosis not present

## 2022-08-26 DIAGNOSIS — M25562 Pain in left knee: Secondary | ICD-10-CM

## 2022-08-26 DIAGNOSIS — M1712 Unilateral primary osteoarthritis, left knee: Secondary | ICD-10-CM

## 2022-08-26 DIAGNOSIS — I872 Venous insufficiency (chronic) (peripheral): Secondary | ICD-10-CM

## 2022-08-26 NOTE — Progress Notes (Signed)
  Care Coordination   Note   08/26/2022 Name: Adam Ray MRN: [id number] DOB: 1955-07-06  Adam Ray is a 67 y.o. year old male who sees [name], Delorse Limber, MD for primary care. I reached out to Marcello Moores by phone today to offer care coordination services.  [name] was given information about Care Coordination services today including:   The Care Coordination services include support from the care team which includes your Nurse Coordinator, Clinical Social Worker, or Pharmacist.  The Care Coordination team is here to help remove barriers to the health concerns and goals most important to you. Care Coordination services are voluntary, and the patient may decline or stop services at any time by request to their care team member.   Care Coordination Consent Status: Patient agreed to services and verbal consent obtained.   Follow up plan:  Telephone appointment with care coordination team member scheduled for:  08/29/22  Encounter Outcome:  Pt. Scheduled  [place]  Direct Dial: [phone]

## 2022-08-26 NOTE — Progress Notes (Signed)
Office Visit Note   Patient: Adam Ray           Date of Birth: 01-Oct-1955           MRN: [id number] Visit Date: 08/26/2022              Requested by: [name], MD [address] [place],  [place] [address] PCP: [name], MD  Chief Complaint  Patient presents with   Left Leg - Pain      HPI: Patient is a 67 year old gentleman with lymphedema of the left lower extremity right transtibial amputation who presents with increasing pain of the left knee.  Patient states the pain has been present for 2 weeks.  Assessment & Plan: Visit Diagnoses:  1. Acute pain of left knee   2. Unilateral primary osteoarthritis, left knee     Plan: Recommended topical Voltaren gel 3 times a day for the left knee.  Discussed that we could proceed with an injection.  Patient has not received his lymphedema pumps yet.  Follow-Up Instructions: Return if symptoms worsen or fail to improve.   Ortho Exam  Patient is alert, oriented, no adenopathy, well-dressed, normal affect, normal respiratory effort. Examination patient has lymphatic swelling of the left lower extremity no open ulcers no cellulitis.  Left knee has no effusion.  Radiograph shows mild arthritic changes.  Will wrap the left leg until the lymphedema pumps are available.  Imaging: XR Knee 1-2 Views Left  Result Date: 08/26/2022 2 view radiographs of the left knee shows small periarticular bony spurs with no joint space narrowing.  No images are attached to the encounter.  Labs: Lab Results  Component Value Date   HGBA1C 7.9 (A) 07/05/2022   HGBA1C 9.4 (H) 01/17/2022   HGBA1C 7.8 (A) 09/14/2021   ESRSEDRATE 54 (H) 01/01/2020   ESRSEDRATE 8 01/29/2016   ESRSEDRATE 9 04/11/2012   CRP 2.6 (H) 01/29/2016   CRP 0.5 02/28/2014   REPTSTATUS 01/18/2020 FINAL 01/17/2020   REPTSTATUS 01/22/2020 FINAL 01/17/2020   REPTSTATUS 01/22/2020 FINAL 01/17/2020   GRAMSTAIN  04/11/2012    NO WBC SEEN RARE SQUAMOUS  EPITHELIAL CELLS PRESENT MODERATE GRAM POSITIVE COCCI IN PAIRS RARE GRAM NEGATIVE RODS   GRAMSTAIN  04/11/2012    NO WBC SEEN RARE SQUAMOUS EPITHELIAL CELLS PRESENT MODERATE GRAM POSITIVE COCCI IN PAIRS RARE GRAM NEGATIVE RODS   CULT (A) 01/17/2020    <10,000 COLONIES/mL INSIGNIFICANT GROWTH Performed at [facility], [address] [place], [place] [address]    CULT  01/17/2020    NO GROWTH 5 DAYS Performed at [facility] [address], [place], [place] [address]    CULT  01/17/2020    NO GROWTH 5 DAYS Performed at [facility] [address], [place], [place] [address]    LABORGA NO GROWTH 2 DAYS 07/19/2014     Lab Results  Component Value Date   ALBUMIN 4.5 01/17/2022   ALBUMIN 4.1 12/06/2020   ALBUMIN 4.3 12/01/2020    No results found for: "MG" No results found for: "VD25OH"  No results found for: "PREALBUMIN"    Latest Ref Rng & Units 01/17/2022    3:14 PM 12/06/2020    4:42 PM 12/01/2020    3:51 PM  CBC EXTENDED  WBC 3.4 - 10.8 x10E3/uL 3.8  5.5  6.3   RBC 4.14 - 5.80 x10E6/uL 4.64  4.90  4.66   Hemoglobin 13.0 - 17.7 g/dL 13.5  13.7  13.1   HCT 37.5 - 51.0 % 39.6  39.8  38.8   Platelets 150 - 450 x10E3/uL 104  138  104.0   NEUT# 1.4 - 7.7 K/uL   4.8   Lymph# 0.7 - 4.0 K/uL   0.9      There is no height or weight on file to calculate BMI.  Orders:  Orders Placed This Encounter  Procedures   XR Knee 1-2 Views Left   No orders of the defined types were placed in this encounter.    Procedures: No procedures performed  Clinical Data: No additional findings.  ROS:  All other systems negative, except as noted in the HPI. Review of Systems  Objective: Vital Signs: There were no vitals taken for this visit.  Specialty Comments:  No specialty comments available.  PMFS History: Patient Active Problem List   Diagnosis Date Noted   Tongue discoloration 02/14/2022   Hematuria 08/16/2021   Illness 12/06/2020   Urinary  incontinence 08/10/2020   Blurred vision, bilateral 07/13/2020   Increased urinary frequency 05/09/2020   Dehiscence of amputation stump ([place])    Coronary artery disease 01/18/2020   Nocturnal leg cramps 10/28/2019   Hyperlipidemia 05/26/2019   Edema leg 12/29/2018   STEMI involving left circumflex coronary artery ([place]) 07/12/2018   Status post transmetatarsal amputation of foot, right ([place]) 07/08/2018   Limited mobility 05/12/2018   Gingival foreign body 07/22/2017   Anxiety state 06/02/2017   Leg swelling 01/29/2017   Idiopathic chronic venous hypertension of both lower extremities with inflammation 09/24/2016   Testicular mass 04/18/2016   Subacute osteomyelitis, right ankle and foot ([place]) 01/29/2016   Diabetic polyneuropathy associated with type 2 diabetes mellitus ([place])    Type 2 diabetes mellitus with neurologic complication, with long-term current use of insulin ([place]) 12/08/2015   Pulmonary nodule 02/01/2015   Depression 12/09/2013   Warts, genital 08/20/2013   Diabetic retinopathy ([place]) 01/28/2013   Sleep apnea 09/06/2010   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   Essential hypertension 11/09/2008   COPD, mild ([place]) 10/06/2006   SICKLE-CELL TRAIT 04/26/2006   ERECTILE DYSFUNCTION 04/26/2006   Tobacco use 04/26/2006   PAD (peripheral artery disease) ([place]) 04/26/2006   ALLERGIC RHINITIS 04/26/2006   Past Medical History:  Diagnosis Date   Allergy    Arthritis    Bronchitis    Cataract    CHF (congestive heart failure) ([place])    Chronic kidney disease    Claudication ([place])    right foot ray resection   Colon polyps    hyperplastic   COPD (chronic obstructive pulmonary disease) ([place])    Coronary artery disease    Diabetes mellitus    type II   Genital warts    Gout    Hyperlipidemia    Hypertension    Myocardial infarction ([place])    Osteomyelitis of third toe of right foot ([place])    Pneumonia    Status post amputation of toe of right foot ([place]) 09/24/2016    Family  History  Problem Relation Age of Onset   Diabetes Mother    Stroke Mother    Heart failure Father    Heart attack Father    Diabetes Sister        multiple siblings   Diabetes Brother        muliple siblings   Heart disease Brother    Colon cancer Neg Hx    Esophageal cancer Neg Hx    Rectal cancer Neg Hx  Stomach cancer Neg Hx     Past Surgical History:  Procedure Laterality Date   AMPUTATION Right 01/31/2016   Procedure: Right 2nd Toe Amputation;  Surgeon: Newt Minion, MD;  Location: [facility];  Service: Orthopedics;  Laterality: Right;   AMPUTATION Right 07/08/2018   Procedure: RIGHT TRANSMETATARSAL AMPUTATION;  Surgeon: Newt Minion, MD;  Location: [facility];  Service: Orthopedics;  Laterality: Right;   AMPUTATION Right 01/05/2020   Procedure: RIGHT BELOW KNEE AMPUTATION;  Surgeon: Newt Minion, MD;  Location: [facility];  Service: Orthopedics;  Laterality: Right;   AMPUTATION Right 02/23/2020   Procedure: RIGHT BELOW KNEE AMPUTATION REVISION;  Surgeon: Newt Minion, MD;  Location: [facility];  Service: Orthopedics;  Laterality: Right;   CATARACT EXTRACTION     right eye   COLONOSCOPY     CORONARY STENT INTERVENTION N/A 07/12/2018   Procedure: CORONARY STENT INTERVENTION;  Surgeon: Troy Sine, MD;  Location: [facility];  Service: Cardiovascular;  Laterality: N/A;   CORONARY/GRAFT ACUTE MI REVASCULARIZATION N/A 07/12/2018   Procedure: Coronary/Graft Acute MI Revascularization;  Surgeon: Troy Sine, MD;  Location: [facility];  Service: Cardiovascular;  Laterality: N/A;   I & D EXTREMITY  04/11/2012   Procedure: IRRIGATION AND DEBRIDEMENT EXTREMITY;  Surgeon: Wylene Simmer, MD;  Location: [facility];  Service: Orthopedics;  Laterality: Right;   LEFT HEART CATH AND CORONARY ANGIOGRAPHY N/A 07/12/2018   Procedure: LEFT HEART CATH AND CORONARY ANGIOGRAPHY;  Surgeon: Troy Sine, MD;  Location: [facility];  Service: Cardiovascular;  Laterality: N/A;   Surgery left great  toe     Tear ducts bilateral eyes     TRANSMETATARSAL AMPUTATION Right 07/08/2018   Social History   Occupational History   Occupation: disabled  Tobacco Use   Smoking status: Former    Packs/day: 0.30    Years: 48.00    Total pack years: 14.40    Types: Cigars, Cigarettes    Start date: 07/02/1963    Quit date: 06/01/2019    Years since quitting: 3.2   Smokeless tobacco: Former  Scientific laboratory technician Use: Former  Substance and Sexual Activity   Alcohol use: Yes    Alcohol/week: 0.0 standard drinks of alcohol   Drug use: No   Sexual activity: Not on file

## 2022-08-26 NOTE — Progress Notes (Signed)
  Care Coordination  Outreach Note  08/26/2022 Name: Adam Ray MRN: [id number] DOB: August 14, 1955   Care Coordination Outreach Attempts: An unsuccessful telephone outreach was attempted today to offer the patient information about available care coordination services as a benefit of their health plan.   Follow Up Plan:  Additional outreach attempts will be made to offer the patient care coordination information and services.   Encounter Outcome:  No Answer  [place]  Direct Dial: [phone]

## 2022-08-29 ENCOUNTER — Ambulatory Visit: Payer: Self-pay

## 2022-08-29 DIAGNOSIS — I10 Essential (primary) hypertension: Secondary | ICD-10-CM

## 2022-08-29 NOTE — Patient Outreach (Signed)
  Care Coordination   Initial Visit Note   08/29/2022 Name: Adam Ray MRN: [id number] DOB: 1955-09-01  Adam Ray is a 67 y.o. year old male who sees [name], Delorse Limber, MD for primary care. I spoke with  Adam Ray by phone today.  What matters to the patients health and wellness today?  Resource linking to address food insecurity and transportation barriers to medical appointments.    Goals Addressed             This Visit's Progress    Care Coordination Activities       Care Coordination Interventions: Referral received indicating patient is experiencing food insecurity and in need of transportation resources Reviewed health plan U Card benefit that assists with purchasing healthy foods - patient endorses use of this benefit Education provided on Food and Nutrittion Services benefits - patient would like a paper application mailed to his home for him to complete Determined the patient uses his health plan and bus system for in county transportation needs. Patient reports he is in need of resources to get him to his [place]. Patient does not have a future appointment scheduled but would like to understand resources available for when it is needed Discussed plan for SW to place a referral to the community resource care guide team to educate patient on available resources Scheduled follow up call with the patient on 3/21         SDOH assessments and interventions completed:  Yes  SDOH Interventions Today    Flowsheet Row Most Recent Value  SDOH Interventions   Food Insecurity Interventions Assist with SNAP Application  Housing Interventions Intervention Not Indicated  Transportation Interventions AMB Referral  [CRR CG Referral for transportation needs to [facility]]  Utilities Interventions Intervention Not Indicated        Care Coordination Interventions:  Yes, provided   Interventions Today    Flowsheet Row Most Recent Value  Chronic  Disease   Chronic disease during today's visit Hypertension (HTN), Chronic Obstructive Pulmonary Disease (COPD), Diabetes  General Interventions   General Interventions Discussed/Reviewed General Interventions Discussed, Hospital doctor to Care Guide team: Transportation]  Education Interventions   Education Provided Provided Education  Provided Orthoptist On [employer]        Follow up plan: Follow up call scheduled for 3/21    Encounter Outcome:  Pt. Visit Completed   Daneen Schick, Arita Miss, CDP Social Worker, Certified Dementia Practitioner [facility]  Care Coordination [phone]

## 2022-08-29 NOTE — Progress Notes (Addendum)
  SUBJECTIVE:   CHIEF COMPLAINT / HPI:   STD testing -wife wants him to be tested for all STDs -Wife states that the patient "peed during sex" which she had not seen before and wanted to get him tested -Patient denies rash, lesions, discharge, pain, fevers -Patient does mention  Swelling in L leg -Ongoing x2-3 months, has not worsened recently, is not giving him any new pain or irritation -Follows w/ Dr. Sharol Given orthopedics, diagnosed w/ chronic venous insufficiency -Takes lasix, is curious if he can increase this dose   PERTINENT  PMH / PSH: Venous insufficiency   OBJECTIVE:  BP (!) 105/58   Pulse 72   Ht '5\' 10"'$  (1.778 m)   Wt 83.9 kg   SpO2 99%   BMI 26.54 kg/m   General: NAD, pleasant, able to participate in exam Respiratory: No respiratory distress Abd: Soft NT/ND Skin: warm and dry, no rashes noted Psych: Normal affect and mood MSK: RLE prosthesis.  LLE with chronic swelling, wrap/dressing in place   ASSESSMENT/PLAN:   1. Routine screening for STI (sexually transmitted infection) Patient is asymptomatic at this time, but will obtain STI testing per shared decision making with him and his wife - Urine cytology ancillary only - HIV antibody (with reflex) - RPR  2. Leg swelling 2/2 known chronic venous insufficiency, recommend that he continue his current dose of Lasix and not increase it.  Continue follow-up with orthopedics.  Kidney function stable per last check.  Return if symptoms worsen or fail to improve.  August Albino, MD [facility]

## 2022-08-29 NOTE — Patient Instructions (Signed)
Visit Information  Thank you for taking time to visit with me today. Please don't hesitate to contact me if I can be of assistance to you.   Following are the goals we discussed today:   Goals Addressed             This Visit's Progress    Care Coordination Activities       Care Coordination Interventions: Referral received indicating patient is experiencing food insecurity and in need of transportation resources Reviewed health plan U Card benefit that assists with purchasing healthy foods - patient endorses use of this benefit Education provided on Food and Nutrittion Services benefits - patient would like a paper application mailed to his home for him to complete Determined the patient uses his health plan and bus system for in county transportation needs. Patient reports he is in need of resources to get him to his [place]. Patient does not have a future appointment scheduled but would like to understand resources available for when it is needed Discussed plan for SW to place a referral to the community resource care guide team to educate patient on available resources Scheduled follow up call with the patient on 3/21         Our next appointment is by telephone on 3/21 at 10:00   Please call the care guide team at [phone] if you need to cancel or reschedule your appointment.   If you are experiencing a Mental Health or [place] or need someone to talk to, please go to [facility] [address] [place] [phone]) call 911  Patient verbalizes understanding of instructions and care plan provided today and agrees to view in [place]. Active MyChart status and patient understanding of how to access instructions and care plan via MyChart confirmed with patient.     Telephone follow up appointment with care management team member scheduled for:3/21  Daneen Schick, BSW, CDP Social Worker, Certified  Dementia Practitioner [facility]  Care Coordination [phone]

## 2022-08-30 ENCOUNTER — Ambulatory Visit (INDEPENDENT_AMBULATORY_CARE_PROVIDER_SITE_OTHER): Payer: 59 | Admitting: Family Medicine

## 2022-08-30 ENCOUNTER — Encounter: Payer: Self-pay | Admitting: Family Medicine

## 2022-08-30 VITALS — BP 105/58 | HR 72 | Ht 70.0 in | Wt 185.0 lb

## 2022-08-30 DIAGNOSIS — Z113 Encounter for screening for infections with a predominantly sexual mode of transmission: Secondary | ICD-10-CM

## 2022-08-30 DIAGNOSIS — M7989 Other specified soft tissue disorders: Secondary | ICD-10-CM | POA: Diagnosis not present

## 2022-08-30 NOTE — Patient Instructions (Addendum)
I will give you a call if any of your results come back positive.  If not, I will send you a MyChart message or letter in the mail  Please continue taking your current Lasix dose

## 2022-08-30 NOTE — Addendum Note (Signed)
Addended byJoeseph Amor, Takeshi Teasdale on: 08/30/2022 02:55 PM   Modules accepted: Orders

## 2022-08-31 LAB — RPR: RPR Ser Ql: NONREACTIVE

## 2022-08-31 LAB — HIV ANTIBODY (ROUTINE TESTING W REFLEX): HIV Screen 4th Generation wRfx: NONREACTIVE

## 2022-09-02 ENCOUNTER — Encounter: Payer: Self-pay | Admitting: Orthopedic Surgery

## 2022-09-02 ENCOUNTER — Ambulatory Visit (INDEPENDENT_AMBULATORY_CARE_PROVIDER_SITE_OTHER): Payer: No Typology Code available for payment source | Admitting: Orthopedic Surgery

## 2022-09-02 DIAGNOSIS — L97529 Non-pressure chronic ulcer of other part of left foot with unspecified severity: Secondary | ICD-10-CM | POA: Diagnosis not present

## 2022-09-02 DIAGNOSIS — E11621 Type 2 diabetes mellitus with foot ulcer: Secondary | ICD-10-CM | POA: Diagnosis not present

## 2022-09-02 DIAGNOSIS — I872 Venous insufficiency (chronic) (peripheral): Secondary | ICD-10-CM

## 2022-09-02 DIAGNOSIS — I890 Lymphedema, not elsewhere classified: Secondary | ICD-10-CM

## 2022-09-02 NOTE — Progress Notes (Signed)
Office Visit Note   Patient: Adam Ray           Date of Birth: 19-Mar-1956           MRN: [id number] Visit Date: 09/02/2022              Requested by: [name], MD [address] [place],  [place] [address] PCP: [name], MD  Chief Complaint  Patient presents with   Left Leg - Follow-up      HPI: Patient is a 67 year old gentleman who is seen for 2 separate issues.  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  Patient also presents for a Wagner grade 1 ulcer left great toe.  Assessment & Plan: Visit Diagnoses:  1. 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 Ulcer of left great toe due to diabetes mellitus ([place])     Plan: Patient will follow-up with tactile for demonstration and use of the lymphedema pumps.  The ulcer was debrided.  Will apply a Dynaflex wrap for compression of the left lower extremity until patient is ready to proceed with the pumps.  Follow-Up Instructions: No follow-ups on file.   Ortho Exam  Patient is alert, oriented, no adenopathy, well-dressed, normal affect, normal respiratory effort. Examination patient still has pitting lymphedema of the left lower extremity.  The skin is starting to wrinkle in his leg.  Patient has a Wagner grade 1 ulcer beneath the left great toe first metatarsal head.  After informed consent a 10 blade knife was used debride the skin and soft tissue back to healthy viable tissue.  The ulcer was 2 cm in diameter 0.1 mm deep without any tunneling or exposed bone or tendon.  Imaging: No results found. No images are attached to the encounter.  Labs: Lab Results  Component Value Date   HGBA1C 7.9 (A) 07/05/2022   HGBA1C 9.4 (H) 01/17/2022   HGBA1C 7.8 (A) 09/14/2021   ESRSEDRATE 54 (H) 01/01/2020   ESRSEDRATE 8 01/29/2016   ESRSEDRATE 9 04/11/2012   CRP 2.6 (H) 01/29/2016   CRP 0.5 02/28/2014   REPTSTATUS 01/18/2020  FINAL 01/17/2020   REPTSTATUS 01/22/2020 FINAL 01/17/2020   REPTSTATUS 01/22/2020 FINAL 01/17/2020   GRAMSTAIN  04/11/2012    NO WBC SEEN RARE SQUAMOUS EPITHELIAL CELLS PRESENT MODERATE GRAM POSITIVE COCCI IN PAIRS RARE GRAM NEGATIVE RODS   GRAMSTAIN  04/11/2012    NO WBC SEEN RARE SQUAMOUS EPITHELIAL CELLS PRESENT MODERATE GRAM POSITIVE COCCI IN PAIRS RARE GRAM NEGATIVE RODS   CULT (A) 01/17/2020    <10,000 COLONIES/mL INSIGNIFICANT GROWTH Performed at [facility], [address], [place], [place] [address]    CULT  01/17/2020    NO GROWTH 5 DAYS Performed at [facility] [address], [place], [place] [address]    CULT  01/17/2020    NO GROWTH 5 DAYS Performed at [facility] [address] [place],  [address]    LABORGA NO GROWTH 2 DAYS 07/19/2014     Lab Results  Component Value Date   ALBUMIN 4.5 01/17/2022   ALBUMIN 4.1 12/06/2020   ALBUMIN 4.3 12/01/2020    No results found for: "MG" No results found for: "VD25OH"  No results found for: "PREALBUMIN"    Latest Ref Rng & Units 01/17/2022    3:14 PM 12/06/2020    4:42 PM 12/01/2020    3:51  PM  CBC EXTENDED  WBC 3.4 - 10.8 x10E3/uL 3.8  5.5  6.3   RBC 4.14 - 5.80 x10E6/uL 4.64  4.90  4.66   Hemoglobin 13.0 - 17.7 g/dL 13.5  13.7  13.1   HCT 37.5 - 51.0 % 39.6  39.8  38.8   Platelets 150 - 450 x10E3/uL 104  138  104.0   NEUT# 1.4 - 7.7 K/uL   4.8   Lymph# 0.7 - 4.0 K/uL   0.9      There is no height or weight on file to calculate BMI.  Orders:  No orders of the defined types were placed in this encounter.  No orders of the defined types were placed in this encounter.    Procedures: No procedures performed  Clinical Data: No additional findings.  ROS:  All other systems negative, except as noted in the HPI. Review of Systems  Objective: Vital Signs: There were no vitals taken for this visit.  Specialty Comments:  No specialty comments available.  PMFS  History: Patient Active Problem List   Diagnosis Date Noted   Tongue discoloration 02/14/2022   Hematuria 08/16/2021   Illness 12/06/2020   Urinary incontinence 08/10/2020   Blurred vision, bilateral 07/13/2020   Increased urinary frequency 05/09/2020   Dehiscence of amputation stump ([place])    Coronary artery disease 01/18/2020   Nocturnal leg cramps 10/28/2019   Hyperlipidemia 05/26/2019   Edema leg 12/29/2018   STEMI involving left circumflex coronary artery ([place]) 07/12/2018   Status post transmetatarsal amputation of foot, right ([place]) 07/08/2018   Limited mobility 05/12/2018   Gingival foreign body 07/22/2017   Anxiety state 06/02/2017   Leg swelling 01/29/2017   Idiopathic chronic venous hypertension of both lower extremities with inflammation 09/24/2016   Testicular mass 04/18/2016   Subacute osteomyelitis, right ankle and foot ([place]) 01/29/2016   Diabetic polyneuropathy associated with type 2 diabetes mellitus ([place])    Type 2 diabetes mellitus with neurologic complication, with long-term current use of insulin ([place]) 12/08/2015   Pulmonary nodule 02/01/2015   Depression 12/09/2013   Warts, genital 08/20/2013   Diabetic retinopathy ([place]) 01/28/2013   Sleep apnea 09/06/2010   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   Essential hypertension 11/09/2008   COPD, mild ([place]) 10/06/2006   SICKLE-CELL TRAIT 04/26/2006   ERECTILE DYSFUNCTION 04/26/2006   Tobacco use 04/26/2006   PAD (peripheral artery disease) ([place]) 04/26/2006   ALLERGIC RHINITIS 04/26/2006   Past Medical History:  Diagnosis Date   Allergy    Arthritis    Bronchitis    Cataract    CHF (congestive heart failure) ([place])    Chronic kidney disease    Claudication ([place])    right foot ray resection   Colon polyps    hyperplastic   COPD (chronic obstructive pulmonary disease) ([place])    Coronary artery disease    Diabetes mellitus    type II   Genital warts    Gout    Hyperlipidemia    Hypertension    Myocardial  infarction ([place])    Osteomyelitis of third toe of right foot ([place])    Pneumonia    Status post amputation of toe of right foot ([place]) 09/24/2016    Family History  Problem Relation Age of Onset   Diabetes Mother    Stroke Mother    Heart failure Father    Heart attack Father    Diabetes Sister        multiple siblings   Diabetes Brother  muliple siblings   Heart disease Brother    Colon cancer Neg Hx    Esophageal cancer Neg Hx    Rectal cancer Neg Hx    Stomach cancer Neg Hx     Past Surgical History:  Procedure Laterality Date   AMPUTATION Right 01/31/2016   Procedure: Right 2nd Toe Amputation;  Surgeon: Newt Minion, MD;  Location: [facility];  Service: Orthopedics;  Laterality: Right;   AMPUTATION Right 07/08/2018   Procedure: RIGHT TRANSMETATARSAL AMPUTATION;  Surgeon: Newt Minion, MD;  Location: [facility];  Service: Orthopedics;  Laterality: Right;   AMPUTATION Right 01/05/2020   Procedure: RIGHT BELOW KNEE AMPUTATION;  Surgeon: Newt Minion, MD;  Location: [facility];  Service: Orthopedics;  Laterality: Right;   AMPUTATION Right 02/23/2020   Procedure: RIGHT BELOW KNEE AMPUTATION REVISION;  Surgeon: Newt Minion, MD;  Location: [facility];  Service: Orthopedics;  Laterality: Right;   CATARACT EXTRACTION     right eye   COLONOSCOPY     CORONARY STENT INTERVENTION N/A 07/12/2018   Procedure: CORONARY STENT INTERVENTION;  Surgeon: Troy Sine, MD;  Location: [facility];  Service: Cardiovascular;  Laterality: N/A;   CORONARY/GRAFT ACUTE MI REVASCULARIZATION N/A 07/12/2018   Procedure: Coronary/Graft Acute MI Revascularization;  Surgeon: Troy Sine, MD;  Location: [facility];  Service: Cardiovascular;  Laterality: N/A;   I & D EXTREMITY  04/11/2012   Procedure: IRRIGATION AND DEBRIDEMENT EXTREMITY;  Surgeon: Wylene Simmer, MD;  Location: [facility];  Service: Orthopedics;  Laterality: Right;   LEFT HEART CATH AND CORONARY ANGIOGRAPHY N/A 07/12/2018   Procedure: LEFT HEART  CATH AND CORONARY ANGIOGRAPHY;  Surgeon: Troy Sine, MD;  Location: [facility];  Service: Cardiovascular;  Laterality: N/A;   Surgery left great toe     Tear ducts bilateral eyes     TRANSMETATARSAL AMPUTATION Right 07/08/2018   Social History   Occupational History   Occupation: disabled  Tobacco Use   Smoking status: Former    Packs/day: 0.30    Years: 48.00    Total pack years: 14.40    Types: Cigars, Cigarettes    Start date: 07/02/1963    Quit date: 06/01/2019    Years since quitting: 3.2   Smokeless tobacco: Former  Scientific laboratory technician Use: Former  Substance and Sexual Activity   Alcohol use: Yes    Alcohol/week: 0.0 standard drinks of alcohol   Drug use: No   Sexual activity: Not on file

## 2022-09-03 ENCOUNTER — Telehealth: Payer: Self-pay | Admitting: *Deleted

## 2022-09-03 ENCOUNTER — Telehealth: Payer: Self-pay

## 2022-09-03 NOTE — Telephone Encounter (Signed)
   Telephone encounter was:  Unsuccessful.  09/03/2022 Name: Adam Ray MRN: [id number] DOB: October 19, 1955  Unsuccessful outbound call made today to assist with:  Transportation Needs   Outreach Attempt:  1st Attempt  A HIPAA compliant voice message was left requesting a return call.  Instructed patient to call back at [phone].  [place] [phone] [address] , [place] [address] Email : [email or web address]

## 2022-09-03 NOTE — Telephone Encounter (Signed)
Patient calls nurse line requesting to discuss results from visit on 08/30/22. Verified name and DOB. Advised of negative results.   Patient asked that I call his wife and inform her of these results.   DPR on file giving permission to discuss these results. Called and spoke with wife. Provided with negative results.   No further questions at this time.   Talbot Grumbling, RN

## 2022-09-04 ENCOUNTER — Telehealth: Payer: Self-pay | Admitting: *Deleted

## 2022-09-04 NOTE — Telephone Encounter (Signed)
   Telephone encounter was:  Successful.  09/04/2022 Name: LAVALE SENDER MRN: [id number] DOB: 05-09-1956  Marcello Moores is a 67 y.o. year old male who is a primary care patient of [name] Delorse Limber, MD . The community resource team was consulted for assistance with Transportation Needs   Care guide performed the following interventions: Follow up call placed to the patient to discuss status of referral. mailbox full on house phone wife answered at work instructed me to call the other number   [place], [place] [phone] [address] , [place] [address] Email : [email or web address]

## 2022-09-05 ENCOUNTER — Telehealth: Payer: Self-pay

## 2022-09-05 NOTE — Telephone Encounter (Signed)
Patient LVM on nurse line requesting to speak with PCP as soon as possible regarding labs.   Attempted to call patient back to gather more information. He did not answer and VM was full.   Will attempt to call patient again tomorrow.   Talbot Grumbling, RN

## 2022-09-07 ENCOUNTER — Other Ambulatory Visit: Payer: Self-pay

## 2022-09-07 ENCOUNTER — Emergency Department (HOSPITAL_COMMUNITY)
Admission: EM | Admit: 2022-09-07 | Discharge: 2022-09-07 | Disposition: A | Discharge status: Home / Self Care | Payer: No Typology Code available for payment source | Attending: Emergency Medicine | Admitting: Emergency Medicine

## 2022-09-07 ENCOUNTER — Emergency Department (HOSPITAL_COMMUNITY): Payer: No Typology Code available for payment source

## 2022-09-07 DIAGNOSIS — M791 Myalgia, unspecified site: Secondary | ICD-10-CM | POA: Insufficient documentation

## 2022-09-07 DIAGNOSIS — Z743 Need for continuous supervision: Secondary | ICD-10-CM | POA: Diagnosis not present

## 2022-09-07 DIAGNOSIS — Z7982 Long term (current) use of aspirin: Secondary | ICD-10-CM | POA: Insufficient documentation

## 2022-09-07 DIAGNOSIS — Y92410 Unspecified street and highway as the place of occurrence of the external cause: Secondary | ICD-10-CM | POA: Insufficient documentation

## 2022-09-07 DIAGNOSIS — M79603 Pain in arm, unspecified: Secondary | ICD-10-CM | POA: Diagnosis not present

## 2022-09-07 DIAGNOSIS — Z79899 Other long term (current) drug therapy: Secondary | ICD-10-CM | POA: Insufficient documentation

## 2022-09-07 DIAGNOSIS — M7918 Myalgia, other site: Secondary | ICD-10-CM

## 2022-09-07 DIAGNOSIS — Z7984 Long term (current) use of oral hypoglycemic drugs: Secondary | ICD-10-CM | POA: Diagnosis not present

## 2022-09-07 DIAGNOSIS — M19012 Primary osteoarthritis, left shoulder: Secondary | ICD-10-CM | POA: Insufficient documentation

## 2022-09-07 DIAGNOSIS — Z794 Long term (current) use of insulin: Secondary | ICD-10-CM | POA: Insufficient documentation

## 2022-09-07 DIAGNOSIS — M25512 Pain in left shoulder: Secondary | ICD-10-CM | POA: Diagnosis present

## 2022-09-07 MED ORDER — ACETAMINOPHEN 325 MG PO TABS
650.0000 mg | ORAL_TABLET | Freq: Once | ORAL | Status: AC
  Administered 2022-09-07: 650 mg via ORAL
  Filled 2022-09-07: qty 2
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
## 2022-09-07 NOTE — Discharge Instructions (Addendum)
Take Tylenol as directed for pain.  Can also apply Lidoderm gel.  Recheck with your primary care provider.  Return to ER for severe or concerning symptoms.

## 2022-09-09 ENCOUNTER — Ambulatory Visit: Payer: No Typology Code available for payment source | Admitting: Orthopedic Surgery

## 2022-09-09 ENCOUNTER — Telehealth: Payer: Self-pay | Admitting: *Deleted

## 2022-09-09 NOTE — Telephone Encounter (Signed)
   Telephone encounter was:  Successful.  09/09/2022 Name: Adam Ray MRN: [id number] DOB: 12/01/1955  Adam Ray is a 67 y.o. year old male who is a primary care patient of [name] Delorse Limber, MD . The community resource team was consulted for assistance with Transportation Needs   Care guide performed the following interventions: Patient provided with information about care guide support team and interviewed to confirm resource needs. has [employer] for transportation but they will not go to [facility] due to mileage so provided information on [facility] transportation to use for [facility] trips  Follow Up Plan:  No further follow up planned at this time. The patient has been provided with needed resources. [facility] [phone] [address] , [place] [address] Email : [email or web address]

## 2022-09-11 ENCOUNTER — Encounter: Payer: Self-pay | Admitting: Family Medicine

## 2022-09-11 ENCOUNTER — Ambulatory Visit (INDEPENDENT_AMBULATORY_CARE_PROVIDER_SITE_OTHER): Payer: 59 | Admitting: Family Medicine

## 2022-09-11 DIAGNOSIS — M25512 Pain in left shoulder: Secondary | ICD-10-CM | POA: Diagnosis not present

## 2022-09-11 DIAGNOSIS — E11618 Type 2 diabetes mellitus with other diabetic arthropathy: Secondary | ICD-10-CM | POA: Diagnosis not present

## 2022-09-11 MED ORDER — DICLOFENAC SODIUM 1 % EX GEL: 4.0000 g | Freq: Four times a day (QID) | CUTANEOUS | 3 refills | Status: DC

## 2022-09-11 MED ORDER — IBUPROFEN 800 MG PO TABS: 800.0000 mg | ORAL_TABLET | Freq: Three times a day (TID) | ORAL | 3 refills | Status: DC | PRN

## 2022-09-11 MED ORDER — ACETAMINOPHEN ER 650 MG PO TBCR: 650.0000 mg | EXTENDED_RELEASE_TABLET | Freq: Three times a day (TID) | ORAL | 0 refills | Status: DC | PRN

## 2022-09-11 MED ORDER — OXYCODONE HCL 5 MG PO CAPS: 5.0000 mg | ORAL_CAPSULE | Freq: Four times a day (QID) | ORAL | 0 refills | Status: DC | PRN
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
## 2022-09-13 ENCOUNTER — Telehealth: Payer: Self-pay
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
## 2022-09-16 ENCOUNTER — Telehealth: Payer: Self-pay
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
## 2022-09-17 ENCOUNTER — Ambulatory Visit: Payer: No Typology Code available for payment source | Admitting: Sports Medicine

## 2022-09-17 ENCOUNTER — Telehealth: Payer: Self-pay

## 2022-09-17 NOTE — Telephone Encounter (Signed)
     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  Have you been able to follow up with your primary care physician? Appointment scheduled 09/25/22.  The patient was or was not able to obtain any needed medicine or equipment. No medication prescribed.  Are there diet recommendations that you are having difficulty following? No  Patient expresses understanding of discharge instructions and education provided has no other needs at this time. Yes  Verified home mailing address to mail SCAT application.   [place] Resource Care Guide   [email or web address]  ?? [email or web address]   Website: [email or web address]  [email or web address]

## 2022-09-19 ENCOUNTER — Telehealth: Payer: Self-pay

## 2022-09-19 NOTE — Patient Outreach (Signed)
  Care Coordination   09/19/2022 Name: Adam Ray MRN: [id number] DOB: [birth year]   Care Coordination Outreach Attempts:  An unsuccessful telephone outreach was attempted for a scheduled appointment today.  Follow Up Plan:  Additional outreach attempts will be made to offer the patient care coordination information and services.   Encounter Outcome:  No Answer   Care Coordination Interventions:  No, not indicated    Daneen Schick, BSW, CDP Social Worker, Certified Dementia Practitioner [facility]  Care Coordination [phone]

## 2022-09-23 ENCOUNTER — Telehealth: Payer: Self-pay

## 2022-09-23 ENCOUNTER — Ambulatory Visit (INDEPENDENT_AMBULATORY_CARE_PROVIDER_SITE_OTHER): Payer: 59 | Admitting: Orthopedic Surgery

## 2022-09-23 DIAGNOSIS — I890 Lymphedema, not elsewhere classified: Secondary | ICD-10-CM | POA: Diagnosis not present

## 2022-09-23 DIAGNOSIS — I872 Venous insufficiency (chronic) (peripheral): Secondary | ICD-10-CM

## 2022-09-23 DIAGNOSIS — L97529 Non-pressure chronic ulcer of other part of left foot with unspecified severity: Secondary | ICD-10-CM

## 2022-09-23 DIAGNOSIS — E11621 Type 2 diabetes mellitus with foot ulcer: Secondary | ICD-10-CM

## 2022-09-23 NOTE — Telephone Encounter (Signed)
Pt has an appt tomorrow with Junie Panning  ok per Dr. Sharol Given

## 2022-09-23 NOTE — Telephone Encounter (Signed)
Pt called back and stated he doesn't want to be seen tomorrow, says needs to be seen today. I talked to [place] who okd pt to be worked in today

## 2022-09-23 NOTE — Telephone Encounter (Signed)
Pt called and stated his right big toe has "a hole on it" with a smell and wants to be worked in today or tomorrow. No fever or chills. States is draining blood. I worked pt in Set [employer]. Please advise if this is ok or would Dr. Sharol Given like to see him today?

## 2022-09-24 ENCOUNTER — Ambulatory Visit: Payer: No Typology Code available for payment source | Admitting: Family

## 2022-09-30 ENCOUNTER — Ambulatory Visit: Payer: 59 | Admitting: Podiatry

## 2022-10-01 ENCOUNTER — Ambulatory Visit: Payer: 59 | Admitting: Podiatry

## 2022-10-01 ENCOUNTER — Telehealth: Payer: Self-pay

## 2022-10-01 NOTE — Patient Outreach (Signed)
  Care Coordination   10/01/2022 Name: Adam Ray MRN: [id number] DOB: 09-22-1955   Care Coordination Outreach Attempts:  A second unsuccessful outreach was attempted today to offer the patient with information about available care coordination services as a benefit of their health plan.     Follow Up Plan:  Additional outreach attempts will be made to offer the patient care coordination information and services.   Encounter Outcome:  No Answer   Care Coordination Interventions:  No, not indicated    Daneen Schick, BSW, CDP Social Worker, Certified Dementia Practitioner [facility]  Care Coordination [phone]

## 2022-10-02 ENCOUNTER — Encounter: Payer: Self-pay | Admitting: Family Medicine

## 2022-10-02 DIAGNOSIS — H2512 Age-related nuclear cataract, left eye: Secondary | ICD-10-CM | POA: Insufficient documentation

## 2022-10-02 DIAGNOSIS — I2510 Atherosclerotic heart disease of native coronary artery without angina pectoris: Secondary | ICD-10-CM | POA: Insufficient documentation

## 2022-10-02 DIAGNOSIS — S88119A Complete traumatic amputation at level between knee and ankle, unspecified lower leg, initial encounter: Secondary | ICD-10-CM | POA: Insufficient documentation

## 2022-10-02 DIAGNOSIS — E1149 Type 2 diabetes mellitus with other diabetic neurological complication: Secondary | ICD-10-CM | POA: Insufficient documentation

## 2022-10-02 DIAGNOSIS — K144 Atrophy of tongue papillae: Secondary | ICD-10-CM | POA: Insufficient documentation

## 2022-10-02 DIAGNOSIS — E113591 Type 2 diabetes mellitus with proliferative diabetic retinopathy without macular edema, right eye: Secondary | ICD-10-CM | POA: Insufficient documentation

## 2022-10-02 DIAGNOSIS — K143 Hypertrophy of tongue papillae: Secondary | ICD-10-CM | POA: Insufficient documentation

## 2022-10-02 DIAGNOSIS — E1122 Type 2 diabetes mellitus with diabetic chronic kidney disease: Secondary | ICD-10-CM | POA: Insufficient documentation

## 2022-10-03 ENCOUNTER — Ambulatory Visit: Payer: Self-pay

## 2022-10-03 NOTE — Patient Instructions (Signed)
Visit Information  Thank you for taking time to visit with me today. Please don't hesitate to contact me if I can be of assistance to you.   Following are the goals we discussed today:   Goals Addressed             This Visit's Progress    COMPLETED: Care Coordination Activities       Care Coordination Interventions: Performed chart review to confirm patient was engaged by the community resource care guide team whom provided transportation resource educations Confirmed patient did receive an FNS application via mail Advised the patient to submit the application directly to DSS - patient stated understanding No other care coordination needs identified at this time - instructed the patient to contact his primary care providers office as needed         If you are experiencing a Mental Health or [place] or need someone to talk to, please go to [facility] [address], [place] [phone])  Patient verbalizes understanding of instructions and care plan provided today and agrees to view in [place]. Active MyChart status and patient understanding of how to access instructions and care plan via MyChart confirmed with patient.     No further follow up required: Please contact your primary care provider as needed.  Daneen Schick, BSW, CDP Social Worker, Certified Dementia Practitioner [facility]  Care Coordination [phone]

## 2022-10-03 NOTE — Patient Outreach (Signed)
  Care Coordination   Follow Up Visit Note   10/03/2022 Name: Adam Ray MRN: [id number] DOB: July 09, 1955  Adam Ray is a 67 y.o. year old male who sees [name], Delorse Limber, MD for primary care. I spoke with  Adam Ray by phone today.  What matters to the patients health and wellness today?  Patient is doing well at this time. 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    Goals Addressed             This Visit's Progress    COMPLETED: Care Coordination Activities       Care Coordination Interventions: Performed chart review to confirm patient was engaged by the community resource care guide team whom provided transportation resource educations Confirmed patient did receive an FNS application via mail Advised the patient to submit the application directly to DSS - patient stated understanding No other care coordination needs identified at this time - instructed the patient to contact his primary care providers office as needed         SDOH assessments and interventions completed:  No     Care Coordination Interventions:  Yes, provided  Interventions Today    Flowsheet Row Most Recent Value  Chronic Disease   Chronic disease during today's visit Chronic Obstructive Pulmonary Disease (COPD)  General Interventions   General Interventions Discussed/Reviewed General Interventions Reviewed  Education Interventions   Education Provided Provided Education  Provided Verbal Education On Community Resources        Follow up plan: No further intervention required.   Encounter Outcome:  Pt. Visit Completed   Daneen Schick, BSW, CDP Social Worker, Certified Dementia Practitioner [place] [facility]  Care Coordination [phone]

## 2022-10-06 ENCOUNTER — Encounter: Payer: Self-pay | Admitting: Orthopedic Surgery

## 2022-10-06 NOTE — Progress Notes (Signed)
Office Visit Note   Patient: Adam Ray           Date of Birth: 1955-09-20           MRN: [id number] Visit Date: 09/23/2022              Requested by: Latrelle Dodrill, MD [address] [place],  [place] [address] PCP: Latrelle Dodrill, MD  Chief Complaint  Patient presents with   Right Foot - Wound Check    Great toe      HPI: Patient is a 67 year old gentleman who presents in follow-up for venous lymphatic insufficiency of both lower extremities.  Patient also has a left great toe wound.  Patient states he is not sure how to use the lymphedema pumps.  He states that he is getting an in-service again.  Patient has a right transtibial amputation.  Assessment & Plan: Visit Diagnoses:  1. 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 Ulcer of left great toe due to diabetes mellitus (HCC)     Plan: Recommend the patient with the wound open in the first webspace on the left.  Patient's wife states she would like to have the left great toe amputated.  Recommended continuing conservative wound care patient is a high risk of the wound not healing.  Follow-Up Instructions: Return in about 2 weeks (around 10/07/2022).   Ortho Exam  Patient is alert, oriented, no adenopathy, well-dressed, normal affect, normal respiratory effort. Examination patient has a palpable anterior tibial pulse he has massive venous stasis insufficiency of the left lower extremity with edema there is no leg ulcers.  There is an ulcer in the first webspace great toe.  Imaging: No results found. No images are attached to the encounter.  Labs: Lab Results  Component Value Date   HGBA1C 7.9 (A) 07/05/2022   HGBA1C 9.4 (H) 01/17/2022   HGBA1C 7.8 (A) 09/14/2021   ESRSEDRATE 54 (H) 01/01/2020   ESRSEDRATE 8 01/29/2016   ESRSEDRATE 9 04/11/2012   CRP 2.6 (H) 01/29/2016   CRP 0.5 02/28/2014   REPTSTATUS 01/18/2020 FINAL 01/17/2020   REPTSTATUS 01/22/2020 FINAL 01/17/2020   REPTSTATUS  01/22/2020 FINAL 01/17/2020   GRAMSTAIN  04/11/2012    NO WBC SEEN RARE SQUAMOUS EPITHELIAL CELLS PRESENT MODERATE GRAM POSITIVE COCCI IN PAIRS RARE GRAM NEGATIVE RODS   GRAMSTAIN  04/11/2012    NO WBC SEEN RARE SQUAMOUS EPITHELIAL CELLS PRESENT MODERATE GRAM POSITIVE COCCI IN PAIRS RARE GRAM NEGATIVE RODS   CULT (A) 01/17/2020    <10,000 COLONIES/mL INSIGNIFICANT GROWTH Performed at [facility], [address], [place], [place] [address]    CULT  01/17/2020    NO GROWTH 5 DAYS Performed at [facility], [address], [place], [place] [address]    CULT  01/17/2020    NO GROWTH 5 DAYS Performed at [facility], [address], [place], [place] [address]    LABORGA NO GROWTH 2 DAYS 07/19/2014     Lab Results  Component Value Date   ALBUMIN 4.5 01/17/2022   ALBUMIN 4.1 12/06/2020   ALBUMIN 4.3 12/01/2020    No results found for: "MG" No results found for: "VD25OH"  No results found for: "PREALBUMIN"    Latest Ref Rng & Units 01/17/2022    3:14 PM 12/06/2020    4:42 PM 12/01/2020    3:51 PM  CBC EXTENDED  WBC 3.4 - 10.8 x10E3/uL 3.8  5.5  6.3   RBC  4.14 - 5.80 x10E6/uL 4.64  4.90  4.66   Hemoglobin 13.0 - 17.7 g/dL 76.2  83.1  51.7   HCT 37.5 - 51.0 % 39.6  39.8  38.8   Platelets 150 - 450 x10E3/uL 104  138  104.0   NEUT# 1.4 - 7.7 K/uL   4.8   Lymph# 0.7 - 4.0 K/uL   0.9      There is no height or weight on file to calculate BMI.  Orders:  No orders of the defined types were placed in this encounter.  No orders of the defined types were placed in this encounter.    Procedures: No procedures performed  Clinical Data: No additional findings.  ROS:  All other systems negative, except as noted in the HPI. Review of Systems  Objective: Vital Signs: There were no vitals taken for this visit.  Specialty Comments:  No specialty comments available.  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 10/02/2022   Atrophy of tongue papillae 10/02/2022   Atherosclerosis of coronary artery without angina pectoris 10/02/2022   Age-related nuclear cataract of left eye 10/02/2022   Type 2 diabetes mellitus with neurological complications 10/02/2022   Brown hairy tongue 10/02/2022   Proliferative diabetic retinopathy of right eye associated with type 2 diabetes mellitus 10/02/2022   Type 2 diabetes mellitus with diabetic chronic kidney disease 10/02/2022   Tongue discoloration 02/14/2022   Hematuria 08/16/2021   Cervical spondylosis without myelopathy 12/14/2020   Pain in joint of left shoulder 12/14/2020   Urinary incontinence 08/10/2020   Increased urinary frequency 05/09/2020   Nocturnal leg cramps 10/28/2019   Hyperlipidemia associated with type 2 diabetes mellitus 05/26/2019   Limited mobility 05/12/2018   Anxiety state 06/02/2017   Leg swelling 01/29/2017   Idiopathic chronic venous hypertension of both lower extremities with inflammation 09/24/2016   Diabetic polyneuropathy associated with type 2 diabetes mellitus    Type 2 diabetes mellitus with neurologic complication, with long-term current use of insulin 12/08/2015   Left shoulder pain 08/10/2015   Pulmonary nodule 02/01/2015   Depression 12/09/2013   Warts, genital 08/20/2013   Diabetic retinopathy (HCC) 01/28/2013   Sleep apnea 09/06/2010   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   Hypertension associated with diabetes 11/09/2008   COPD, mild (HCC) 10/06/2006   SICKLE-CELL TRAIT 04/26/2006   ERECTILE DYSFUNCTION 04/26/2006   Tobacco use 04/26/2006   Peripheral arterial disease 04/26/2006   Allergic rhinitis 04/26/2006   Past Medical History:  Diagnosis Date   Allergy    Arthritis    Bronchitis    Cataract    CHF (congestive heart failure)    Chronic kidney disease    Claudication    right foot ray resection   Colon polyps    hyperplastic   COPD (chronic  obstructive pulmonary disease)    Coronary artery disease    Diabetes mellitus    type II   Genital warts    Gout    Hyperlipidemia    Hypertension    Myocardial infarction    Osteomyelitis of third toe of right foot    Pneumonia    Status post amputation of toe of right foot 09/24/2016   Status post transmetatarsal amputation of foot, right 07/08/2018   STEMI involving left circumflex coronary artery 07/12/2018   Coronary artery disease   Subacute osteomyelitis, right ankle and foot 01/29/2016   Testicular mass 04/18/2016  Family History  Problem Relation Age of Onset   Diabetes Mother    Stroke Mother    Heart failure Father    Heart attack Father    Diabetes Sister        multiple siblings   Diabetes Brother        muliple siblings   Heart disease Brother    Colon cancer Neg Hx    Esophageal cancer Neg Hx    Rectal cancer Neg Hx    Stomach cancer Neg Hx     Past Surgical History:  Procedure Laterality Date   AMPUTATION Right 01/31/2016   Procedure: Right 2nd Toe Amputation;  Surgeon: Nadara MustardMarcus V Kisean Rollo, MD;  [facility];  Service: Orthopedics;  Laterality: Right;   AMPUTATION Right 07/08/2018   Procedure: RIGHT TRANSMETATARSAL AMPUTATION;  Surgeon: Nadara Mustarduda, Hawthorne Day V, MD;  [facility];  Service: Orthopedics;  Laterality: Right;   AMPUTATION Right 01/05/2020   Procedure: RIGHT BELOW KNEE AMPUTATION;  Surgeon: Nadara Mustarduda, Charnell Peplinski V, MD;  [facility];  Service: Orthopedics;  Laterality: Right;   AMPUTATION Right 02/23/2020   Procedure: RIGHT BELOW KNEE AMPUTATION REVISION;  Surgeon: Nadara Mustarduda, Kymberli Wiegand V, MD;  [facility];  Service: Orthopedics;  Laterality: Right;   CATARACT EXTRACTION     right eye   COLONOSCOPY     CORONARY STENT INTERVENTION N/A 07/12/2018   Procedure: CORONARY STENT INTERVENTION;  Surgeon: Lennette BihariKelly, Thomas A, MD;  [facility] INVAS[facility] LAB;  Service: Cardiovascular;  Laterality: N/A;   CORONARY/GRAFT ACUTE MI REVASCULARIZATION N/A 07/12/2018   Procedure:  Coronary/Graft Acute MI Revascularization;  Surgeon: Lennette BihariKelly, Thomas A, MD;  [facility] LAB;  Service: Cardiovascular;  Laterality: N/A;   I & D EXTREMITY  04/11/2012   Procedure: IRRIGATION AND DEBRIDEMENT EXTREMITY;  Surgeon: Toni ArthursJohn Hewitt, MD;  [facility]: [facility] OR;  Service: Orthopedics;  Laterality: Right;   LEFT HEART CATH AND CORONARY ANGIOGRAPHY N/A 07/12/2018   Procedure: LEFT HEART CATH AND CORONARY ANGIOGRAPHY;  Surgeon: Lennette BihariKelly, Thomas A, MD;  [facility];  Service: Cardiovascular;  Laterality: N/A;   Surgery left great toe     Tear ducts bilateral eyes     TRANSMETATARSAL AMPUTATION Right 07/08/2018   Social History   Occupational History   Occupation: disabled  Tobacco Use   Smoking status: Former    Packs/day: 0.30    Years: 48.00    Additional pack years: 0.00    Total pack years: 14.40    Types: Cigars, Cigarettes    Start date: 07/02/1963    Quit date: 06/01/2019    Years since quitting: 3.3   Smokeless tobacco: Former  Building services engineerVaping Use   Vaping Use: Former  Substance and Sexual Activity   Alcohol use: Yes    Alcohol/week: 0.0 standard drinks of alcohol   Drug use: No   Sexual activity: Not on file

## 2022-10-08 ENCOUNTER — Encounter: Payer: Self-pay | Admitting: Orthopedic Surgery

## 2022-10-08 ENCOUNTER — Ambulatory Visit (INDEPENDENT_AMBULATORY_CARE_PROVIDER_SITE_OTHER): Payer: 59 | Admitting: Orthopedic Surgery

## 2022-10-08 DIAGNOSIS — I739 Peripheral vascular disease, unspecified: Secondary | ICD-10-CM

## 2022-10-08 DIAGNOSIS — I87323 Chronic venous hypertension (idiopathic) with inflammation of bilateral lower extremity: Secondary | ICD-10-CM | POA: Diagnosis not present

## 2022-10-08 DIAGNOSIS — E11621 Type 2 diabetes mellitus with foot ulcer: Secondary | ICD-10-CM | POA: Diagnosis not present

## 2022-10-08 DIAGNOSIS — M869 Osteomyelitis, unspecified: Secondary | ICD-10-CM

## 2022-10-08 DIAGNOSIS — L97529 Non-pressure chronic ulcer of other part of left foot with unspecified severity: Secondary | ICD-10-CM

## 2022-10-08 MED ORDER — DOXYCYCLINE HYCLATE 100 MG PO TABS: 100.0000 mg | ORAL_TABLET | Freq: Two times a day (BID) | ORAL | 0 refills | Status: DC

## 2022-10-08 NOTE — Progress Notes (Signed)
Office Visit Note   Patient: Adam Ray           Date of Birth: 07-04-1955           MRN: [id number] Visit Date: 10/08/2022              Requested by: Latrelle Dodrill, MD [address] [place],  [place] [address] PCP: Latrelle Dodrill, MD  Chief Complaint  Patient presents with   Left Foot - Follow-up    GT ulcer       HPI: Patient is a 67 year old gentleman with a right transtibial amputation.  Patient has had overlapping of the great toe and second toe and using gauze to unload the pressure.  Patient at this time has developed a full-thickness ulcer over the PIP joint of the second toe and a purulent abscess of the MTP joint of the great toe.  Assessment & Plan: Visit Diagnoses:  1. Ulcer of left great toe due to diabetes mellitus   2. Idiopathic chronic venous hypertension of both lower extremities with inflammation   3. Peripheral vascular disease   4. Osteomyelitis of second toe of left foot     Plan: With the abscess of the great toe and exposed bone of the second toe have recommended proceeding with amputation of the great toe and second toe.  Risk and benefits were discussed including risk of the wound not healing need for additional surgery.  Patient and his wife state they understand and wish to proceed at this time a prescription for doxycycline is called in.  Plan for surgery on Friday with follow-up in the office a week after surgery.  Follow-Up Instructions: Return in about 2 weeks (around 10/22/2022).   Ortho Exam  Patient is alert, oriented, no adenopathy, well-dressed, normal affect, normal respiratory effort. Examination patient has developed a new full-thickness ulcer of the second toe with exposed bone of the PIP joint in the first webspace.  The great toe is overlapping the second toe and has a large ulcer over the first metatarsal head.  After informed consent a 10 blade knife was used to debride the skin and soft tissue down to a deep  purulent abscess.  This was decompressed.  After debridement the ulcer is 2 cm in diameter.  Recent ankle-brachial indices shows multiphasic flow with good circulation on the left.  Imaging: No results found. No images are attached to the encounter.  Labs: Lab Results  Component Value Date   HGBA1C 7.9 (A) 07/05/2022   HGBA1C 9.4 (H) 01/17/2022   HGBA1C 7.8 (A) 09/14/2021   ESRSEDRATE 54 (H) 01/01/2020   ESRSEDRATE 8 01/29/2016   ESRSEDRATE 9 04/11/2012   CRP 2.6 (H) 01/29/2016   CRP 0.5 02/28/2014   REPTSTATUS 01/18/2020 FINAL 01/17/2020   REPTSTATUS 01/22/2020 FINAL 01/17/2020   REPTSTATUS 01/22/2020 FINAL 01/17/2020   GRAMSTAIN  04/11/2012    NO WBC SEEN RARE SQUAMOUS EPITHELIAL CELLS PRESENT MODERATE GRAM POSITIVE COCCI IN PAIRS RARE GRAM NEGATIVE RODS   GRAMSTAIN  04/11/2012    NO WBC SEEN RARE SQUAMOUS EPITHELIAL CELLS PRESENT MODERATE GRAM POSITIVE COCCI IN PAIRS RARE GRAM NEGATIVE RODS   CULT (A) 01/17/2020    <10,000 COLONIES/mL INSIGNIFICANT GROWTH Performed at [facility], [address] [place], [place] [address]    CULT  01/17/2020    NO GROWTH 5 DAYS Performed at [facility], [address], [place], [place] [address]    CULT  01/17/2020  NO GROWTH 5 DAYS Performed at [facility], [address], [place], [place] [address]    LABORGA NO GROWTH 2 DAYS 07/19/2014     Lab Results  Component Value Date   ALBUMIN 4.5 01/17/2022   ALBUMIN 4.1 12/06/2020   ALBUMIN 4.3 12/01/2020    No results found for: "MG" No results found for: "VD25OH"  No results found for: "PREALBUMIN"    Latest Ref Rng & Units 01/17/2022    3:14 PM 12/06/2020    4:42 PM 12/01/2020    3:51 PM  CBC EXTENDED  WBC 3.4 - 10.8 x10E3/uL 3.8  5.5  6.3   RBC 4.14 - 5.80 x10E6/uL 4.64  4.90  4.66   Hemoglobin 13.0 - 17.7 g/dL 60.4  54.0  98.1   HCT 37.5 - 51.0 % 39.6  39.8  38.8   Platelets 150 - 450 x10E3/uL 104  138  104.0   NEUT# 1.4 - 7.7 K/uL   4.8    Lymph# 0.7 - 4.0 K/uL   0.9      There is no height or weight on file to calculate BMI.  Orders:  No orders of the defined types were placed in this encounter.  Meds ordered this encounter  Medications   doxycycline (VIBRA-TABS) 100 MG tablet    Sig: Take 1 tablet (100 mg total) by mouth 2 (two) times daily.    Dispense:  30 tablet    Refill:  0     Procedures: No procedures performed  Clinical Data: No additional findings.  ROS:  All other systems negative, except as noted in the HPI. Review of Systems  Objective: Vital Signs: There were no vitals taken for this visit.  Specialty Comments:  No specialty comments available.  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 10/02/2022   Atrophy of tongue papillae 10/02/2022   Atherosclerosis of coronary artery without angina pectoris 10/02/2022   Age-related nuclear cataract of left eye 10/02/2022   Type 2 diabetes mellitus with neurological complications 10/02/2022   Brown hairy tongue 10/02/2022   Proliferative diabetic retinopathy of right eye associated with type 2 diabetes mellitus 10/02/2022   Type 2 diabetes mellitus with diabetic chronic kidney disease 10/02/2022   Tongue discoloration 02/14/2022   Hematuria 08/16/2021   Cervical spondylosis without myelopathy 12/14/2020   Pain in joint of left shoulder 12/14/2020   Urinary incontinence 08/10/2020   Increased urinary frequency 05/09/2020   Nocturnal leg cramps 10/28/2019   Hyperlipidemia associated with type 2 diabetes mellitus 05/26/2019   Limited mobility 05/12/2018   Anxiety state 06/02/2017   Leg swelling 01/29/2017   Idiopathic chronic venous hypertension of both lower extremities with inflammation 09/24/2016   Diabetic polyneuropathy associated with type 2 diabetes mellitus    Type 2 diabetes mellitus with neurologic complication, with long-term current use of  insulin 12/08/2015   Left shoulder pain 08/10/2015   Pulmonary nodule 02/01/2015   Depression 12/09/2013   Warts, genital 08/20/2013   Diabetic retinopathy (HCC) 01/28/2013   Sleep apnea 09/06/2010   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   Hypertension associated with diabetes 11/09/2008   COPD, mild (HCC) 10/06/2006   SICKLE-CELL TRAIT 04/26/2006   ERECTILE DYSFUNCTION 04/26/2006   Tobacco use 04/26/2006   Peripheral arterial disease 04/26/2006   Allergic rhinitis 04/26/2006   Past Medical History:  Diagnosis Date   Allergy    Arthritis    Bronchitis  Cataract    CHF (congestive heart failure)    Chronic kidney disease    Claudication    right foot ray resection   Colon polyps    hyperplastic   COPD (chronic obstructive pulmonary disease)    Coronary artery disease    Diabetes mellitus    type II   Genital warts    Gout    Hyperlipidemia    Hypertension    Myocardial infarction    Osteomyelitis of third toe of right foot    Pneumonia    Status post amputation of toe of right foot 09/24/2016   Status post transmetatarsal amputation of foot, right 07/08/2018   STEMI involving left circumflex coronary artery 07/12/2018   Coronary artery disease   Subacute osteomyelitis, right ankle and foot 01/29/2016   Testicular mass 04/18/2016    Family History  Problem Relation Age of Onset   Diabetes Mother    Stroke Mother    Heart failure Father    Heart attack Father    Diabetes Sister        multiple siblings   Diabetes Brother        muliple siblings   Heart disease Brother    Colon cancer Neg Hx    Esophageal cancer Neg Hx    Rectal cancer Neg Hx    Stomach cancer Neg Hx     Past Surgical History:  Procedure Laterality Date   AMPUTATION Right 01/31/2016   Procedure: Right 2nd Toe Amputation;  Surgeon: Nadara MustardMarcus V Shakir Petrosino, MD;  [facility];  Service: Orthopedics;  Laterality: Right;   AMPUTATION Right 07/08/2018   Procedure: RIGHT TRANSMETATARSAL AMPUTATION;  Surgeon:  Nadara Mustarduda, Thadeus Gandolfi V, MD;  [facility];  Service: Orthopedics;  Laterality: Right;   AMPUTATION Right 01/05/2020   Procedure: RIGHT BELOW KNEE AMPUTATION;  Surgeon: Nadara Mustarduda, Spiros Greenfeld V, MD;  [facility];  Service: Orthopedics;  Laterality: Right;   AMPUTATION Right 02/23/2020   Procedure: RIGHT BELOW KNEE AMPUTATION REVISION;  Surgeon: Nadara Mustarduda, Linn Clavin V, MD;  [facility];  Service: Orthopedics;  Laterality: Right;   CATARACT EXTRACTION     right eye   COLONOSCOPY     CORONARY STENT INTERVENTION N/A 07/12/2018   Procedure: CORONARY STENT INTERVENTION;  Surgeon: Lennette BihariKelly, Thomas A, MD;  [facility];  Service: Cardiovascular;  Laterality: N/A;   CORONARY/GRAFT ACUTE MI REVASCULARIZATION N/A 07/12/2018   Procedure: Coronary/Graft Acute MI Revascularization;  Surgeon: Lennette BihariKelly, Thomas A, MD;  [facility];  Service: Cardiovascular;  Laterality: N/A;   I & D EXTREMITY  04/11/2012   Procedure: IRRIGATION AND DEBRIDEMENT EXTREMITY;  Surgeon: Toni ArthursJohn Hewitt, MD;  [facility];  Service: Orthopedics;  Laterality: Right;   LEFT HEART CATH AND CORONARY ANGIOGRAPHY N/A 07/12/2018   Procedure: LEFT HEART CATH AND CORONARY ANGIOGRAPHY;  Surgeon: Lennette BihariKelly, Thomas A, MD;  [facility];  Service: Cardiovascular;  Laterality: N/A;   Surgery left great toe     Tear ducts bilateral eyes     TRANSMETATARSAL AMPUTATION Right 07/08/2018   Social History   Occupational History   Occupation: disabled  Tobacco Use   Smoking status: Former    Packs/day: 0.30    Years: 48.00    Additional pack years: 0.00    Total pack years: 14.40    Types: Cigars, Cigarettes    Start date: 07/02/1963    Quit date: 06/01/2019    Years since quitting: 3.3   Smokeless tobacco: Former  Vaping Use   Vaping Use: Former  Substance and Sexual Activity   Alcohol use: Yes    Alcohol/week: 0.0 standard drinks of alcohol   Drug use: No   Sexual activity: Not on file

## 2022-10-10 ENCOUNTER — Telehealth: Payer: Self-pay | Admitting: Orthopedic Surgery

## 2022-10-10 ENCOUNTER — Encounter (HOSPITAL_COMMUNITY): Payer: Self-pay | Admitting: Orthopedic Surgery

## 2022-10-10 ENCOUNTER — Other Ambulatory Visit: Payer: Self-pay

## 2022-10-10 NOTE — Anesthesia Preprocedure Evaluation (Addendum)
Anesthesia Evaluation  Patient identified by MRN, date of birth, ID band Patient awake    Reviewed: Allergy & Precautions, NPO status , Patient's Chart, lab work & pertinent test results, Unable to perform ROS - Chart review only  Airway Mallampati: II  TM Distance: >3 FB Neck ROM: Full    Dental no notable dental hx. (+) Teeth Intact, Dental Advisory Given   Pulmonary COPD, former smoker   Pulmonary exam normal breath sounds clear to auscultation       Cardiovascular hypertension, + CAD, + Past MI and +CHF  Normal cardiovascular exam Rhythm:Regular Rate:Normal     Neuro/Psych   Anxiety Depression       GI/Hepatic negative GI ROS, Neg liver ROS,,,  Endo/Other  diabetes, Type 2    Renal/GU Lab Results      Component                Value               Date                      CREATININE               1.20                07/23/2022                     Musculoskeletal  (+) Arthritis ,    Abdominal   Peds  Hematology   Anesthesia Other Findings Codeine Morphine Propafol  Reproductive/Obstetrics                             Anesthesia Physical Anesthesia Plan  ASA: 3  Anesthesia Plan: Regional   Post-op Pain Management: Regional block* and Minimal or no pain anticipated   Induction:   PONV Risk Score and Plan: Midazolam, Propofol infusion and Treatment may vary due to age or medical condition  Airway Management Planned: Natural Airway and Nasal Cannula  Additional Equipment: None  Intra-op Plan:   Post-operative Plan:   Informed Consent: I have reviewed the patients History and Physical, chart, labs and discussed the procedure including the risks, benefits and alternatives for the proposed anesthesia with the patient or authorized representative who has indicated his/her understanding and acceptance.     Dental advisory given  Plan Discussed with:   Anesthesia Plan Comments:  (PAT note written 10/10/2022 by Shonna Chock, PA-C.  L popliteal   )       Anesthesia Quick Evaluation

## 2022-10-10 NOTE — Progress Notes (Signed)
Anesthesia Chart Review: Adam Ray  Case: [id number] Date/Time: 10/11/22 0815   Procedure: LEFT GREAT TOE AMPUTATION AND LEFT 2ND TOE AMPUTATION (Left)   Anesthesia type: Choice   Pre-op diagnosis: Abscess Left Great Toe, Osteomyelitis 2nd Toe   [facility]   Surgeons: Adam Mustard, MD       DISCUSSION: Patient is a 67 year old male scheduled for the above procedure.  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   Last cardiology visit with Dr. Allyson Ray was on 07/26/22. No chest pain or SOB. Lasix increased to BID for LE edema thought to be related to diastolic dysfunction +/- venous insufficiency. Six month follow-up planned.   He is a same day work-up, so anesthesia team to evaluate on the day of surgery. By 07/19/22 [facility] labs, Creatinine 1.32, A1c 8.5%, H/H 11.3/34.2, mild thrombocytopenia with PLT count 95K (104K 01/17/22). He will get updated labs on arrival.   He said he is not currently taking Trulicity since he ran out weeks ago. By medication list, he in on Novolog 100 unit/mL 40 units TID with meals, Lantus 35 units daily, metformin 500 mg BID.    VS:  BP Readings from Last 3 Encounters:  09/11/22 (!) 109/59  09/07/22 (!) 155/80  08/30/22 (!) 105/58   Pulse Readings from Last 3 Encounters:  09/11/22 (!) 58  09/07/22 60  08/30/22 72     PROVIDERS: Adam Dodrill, MD is PCP. He also receives some care through the [facility]. Adam Batty, MD is cardiologist Adam Livings, MD is vascular surgeon   LABS: For updated labs on arrival. Cr 1.32, BUN 24, glucose 181, AST 21, ALT 27, H/H 11.4/34.2, PLT 95K, A1c 8.5% 07/29/22 ([facility] CE)   IMAGES: CTA Abd/pelvis 07/23/22: MPRESSION: VASCULAR  1. Mild atherosclerotic disease in the abdominal aorta and iliac arteries. No evidence  for aneurysm, dissection or significant stenosis involving the abdominopelvic arterial structures. 2. Limited evaluation of the veins due to the arterial phase of contrast. However, the IVC and iliac veins are very similar to the exam on 09/12/2021. No significant compression on the left common iliac vein. 3. Left common femoral vein is slightly enlarged. Previous lower extremity venous duplex on 07/03/2022 was negative for DVT but if there is clinical concern for DVT recommend a repeat ultrasound examination.  NON-VASCULAR 1. No acute abnormality in the abdomen or pelvis. 2. Scattered lucent areas throughout the bones particularly in the pelvis. Similar findings on previous examinations but this may have progressed or the findings are more conspicuous on this examination. Consider hematologic evaluation.  CT Chest LCS 01/25/22: IMPRESSION: 1. Lung-RADS 1S, negative. Continue annual screening with low-dose chest CT without contrast in 12 months. 2. The "S" modifier above refers to potentially clinically significant non lung cancer related findings. Specifically, there is aortic atherosclerosis, in addition to left circumflex coronary artery disease. Please note that although the presence of coronary artery calcium documents the presence of coronary artery disease, the severity of this disease and any potential stenosis cannot be assessed on this non-gated CT examination. Assessment for potential risk factor modification, dietary therapy or pharmacologic therapy may be warranted, if clinically indicated. 3. Mild diffuse bronchial wall thickening with mild centrilobular and paraseptal emphysema; imaging findings suggestive of underlying COPD. 4. There are calcifications of the aortic valve. Echocardiographic correlation  for evaluation of potential valvular dysfunction may be warranted if clinically indicated. - Aortic Atherosclerosis (ICD10-I70.0) and Emphysema (ICD10-J43.9).     EKG: EKG 07/26/22: Sinus rhythm with first degree AV block Left axis deviation Voltage criteria for LVH Anteroseptal infarct, age undetermined   CV: Echo 07/13/18: Study Conclusions  - Left ventricle: The cavity size was normal. Wall thickness was    normal. Systolic function was vigorous. The estimated ejection    fraction was in the range of 65% to 70%. Wall motion was normal;    there were no regional wall motion abnormalities. Doppler    parameters are consistent with abnormal left ventricular    relaxation (grade 1 diastolic dysfunction). The E&'e&' ratio is    >15, suggesting elevated LV filling pressure.  - Aortic valve: Bicuspid aortic valve with thickened leaflets. No    significant stenosis. There was no regurgitation.  - Mitral valve: Mildly thickened leaflets . There was mild    regurgitation.  - Left atrium: The atrium was normal in size.  - Inferior vena cava: The vessel was dilated. The respirophasic    diameter changes were blunted (< 50%), consistent with elevated    central venous pressure.  Impressions:  - LVEF 65-70%, normal wall thickness, normal wall motion, grade 1    DD, elevated LV filling pressure, bicuspid aortic valve is again    noted (compared to prior echo in 2011) without stenosis or    regurgitation, mild MR, normal LA size, dilated IVC.    Cardiac cath PCI 07/12/18: Prox Cx to Mid Cx lesion is 95% stenosed. Post intervention, there is a 0% residual stenosis. A stent was successfully placed.   Acute inferior ST segment elevation myocardial infarction secondary to subtotal occlusion of a codominant mid left circumflex coronary artery.   Normal LAD and RCA.   LVEDP 20 mmHg.   Successful PCI to the mid left circumflex coronary artery with ultimate insertion of a 2.25 x 24 mm Synergy DES stent postdilated to 2.4 mm with the stenosis being reduced to 0%.   RECOMMENDATION: DAPT therapy for a minimum of 12 months of aspirin/Brilinta.  Optimal  blood pressure control.  Aggressive lipid intervention with target LDL less than 70.  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   STEMI involving left circumflex coronary artery 07/12/2018   Coronary artery disease   Subacute osteomyelitis, right ankle and foot 01/29/2016   Testicular mass 04/18/2016    Past Surgical History:  Procedure Laterality Date   AMPUTATION Right 01/31/2016   Procedure: Right 2nd Toe Amputation;  Surgeon: Adam Mustard, MD;  Location: [facility] OR;  Service: Orthopedics;  Laterality: Right;   AMPUTATION Right 07/08/2018   Procedure: RIGHT TRANSMETATARSAL AMPUTATION;  Surgeon: Adam Mustard, MD;  [facility];  Service: Orthopedics;  Laterality: Right;   AMPUTATION Right 01/05/2020   Procedure: RIGHT BELOW KNEE AMPUTATION;  Surgeon: Adam Mustarduda, Marcus V, MD;  [facility];  Service: Orthopedics;  Laterality: Right;   AMPUTATION Right 02/23/2020   Procedure: RIGHT BELOW KNEE AMPUTATION REVISION;  Surgeon: Adam Mustarduda, Marcus V, MD;  [facility];  Service: Orthopedics;  Laterality: Right;   CATARACT EXTRACTION     right eye   COLONOSCOPY     CORONARY STENT INTERVENTION N/A 07/12/2018   Procedure: CORONARY STENT INTERVENTION;  Surgeon: Lennette BihariKelly, Thomas A, MD;  [facility];  Service: Cardiovascular;  Laterality: N/A;   CORONARY/GRAFT ACUTE MI REVASCULARIZATION N/A 07/12/2018   Procedure: Coronary/Graft Acute MI  Revascularization;  Surgeon: Lennette BihariKelly, Thomas A, MD;  [facility];  Service: Cardiovascular;  Laterality: N/A;   I & D EXTREMITY  04/11/2012   Procedure: IRRIGATION AND DEBRIDEMENT EXTREMITY;  Surgeon: Toni ArthursJohn Hewitt, MD;  [facility];  Service: Orthopedics;  Laterality: Right;   LEFT HEART CATH AND CORONARY ANGIOGRAPHY N/A 07/12/2018   Procedure: LEFT HEART CATH AND CORONARY ANGIOGRAPHY;  Surgeon: Lennette BihariKelly, Thomas A, MD;  [facility];  Service: Cardiovascular;  Laterality: N/A;   Surgery left great toe     Tear ducts bilateral eyes     TRANSMETATARSAL AMPUTATION Right 07/08/2018    MEDICATIONS: No current facility-administered medications for this encounter.    acetaminophen (TYLENOL) 500 MG tablet   aspirin 81 MG chewable tablet   Baclofen 5 MG TABS   Cholecalciferol (VITAMIN D3) 50 MCG (2000 UT) TABS   CINNAMON PO   diclofenac Sodium (VOLTAREN ARTHRITIS PAIN) 1 % GEL   doxycycline (VIBRA-TABS) 100 MG tablet   famotidine (PEPCID) 20 MG tablet   furosemide (LASIX) 20 MG tablet   insulin aspart (NOVOLOG) 100 UNIT/ML injection   insulin glargine (LANTUS) 100 UNIT/ML injection   metFORMIN (GLUCOPHAGE) 500 MG tablet   metoprolol tartrate (LOPRESSOR) 25 MG tablet   Multiple Vitamin (MULTIVITAMIN WITH MINERALS) TABS tablet   rosuvastatin (CRESTOR) 40 MG tablet   sildenafil (VIAGRA) 100 MG tablet   tamsulosin (FLOMAX) 0.4 MG CAPS capsule   Vibegron (GEMTESA) 75 MG TABS   acetaminophen (TYLENOL 8 HOUR) 650 MG CR tablet   Dulaglutide (TRULICITY) 1.5 MG/0.5ML SOPN   Guaifenesin 100 MG PACK   ibuprofen (ADVIL) 800 MG tablet   tiZANidine (ZANAFLEX) 4 MG capsule   Zoster Vaccine Adjuvanted [facility](SHINGRIX) injection    Shonna ChockAllison Fleeta Kunde, PA-C Surgical Short Stay/Anesthesiology [facility] Phone [phone] [facility] Phone [phone] 10/10/2022 5:32 PM

## 2022-10-10 NOTE — Progress Notes (Signed)
SDW call  Patient was given pre-op instructions over the phone. Patient verbalized understanding of instructions provided.     PCP - Dr. Pollie Meyer Cardiologist - Dr. Gery Pray   PPM/ICD - Denies   Chest x-ray -  EKG -  07/26/2022 Stress Test -01/22/2011 ECHO - 07/13/2018 Cardiac Cath - 07/12/2018  Sleep Study/sleep apnea/CPAP: Denies  Type II diabetic Fasting Blood sugar range: 270-350 How often check sugars: once a day On 10/10/2022: Insulin Aspart (Novolog) Use 40 units TID with meals.  On 10/11/2022: Insulin Aspart (Novolog) Only use 1/2 the correction dose if blood sugar is > 220  On 10/10/2022: Insulin Glargine (Lantus) Use 35 units (100%) in the morning On 10/11/2022: Insulin Glargine (Lantus) Use 17.5 units (50%) in the morning.    Blood Thinner Instructions: Denies Aspirin Instructions: Told to continue ASA   ERAS Protcol - Yes, clear liquids until 0530 PRE-SURGERY Ensure or G2- No   COVID TEST- n/a    Anesthesia review: Yes. DM, HTN, CAD, MI, CHF, Stents, DOPD, high cholesterol   Patient denies shortness of breath, fever, cough and chest pain over the phone call    Your procedure is scheduled on Friday April 12,2024  Report to [facility] Main Entrance "A" at 0600   A.M., then check in with the Admitting office.  Call this number if you have problems the morning of surgery:  [phone]   If you have any questions prior to your surgery date call [phone]: Open Monday-Friday 8am-4pm If you experience any cold or flu symptoms such as cough, fever, chills, shortness of breath, etc. between now and your scheduled surgery, please notify us at the above number     Remember:  Do not eat after midnight the night before your surgery  You may drink clear liquids until 0530    the morning of your surgery.   Clear liquids allowed are: Water, Non-Citrus Juices (without pulp), Carbonated Beverages, Clear Tea, Black Coffee ONLY (NO MILK, CREAM OR POWDERED CREAMER of any kind),  and Gatorade   Take these medicines the morning of surgery with A SIP OF WATER:  Doxycycline, pepcid, metoprolol, flomax, tylenol  As of today, STOP taking any Aleve, Naproxen, Ibuprofen, Motrin, Advil, Goody's, BC's, all herbal medications, fish oil, and all vitamins.

## 2022-10-11 ENCOUNTER — Ambulatory Visit (HOSPITAL_COMMUNITY)
Admission: RE | Admit: 2022-10-11 | Discharge: 2022-10-11 | Disposition: A | Discharge status: Home / Self Care | Payer: 59 | Source: Ambulatory Visit | Attending: Orthopedic Surgery | Admitting: Orthopedic Surgery

## 2022-10-11 ENCOUNTER — Ambulatory Visit (INDEPENDENT_AMBULATORY_CARE_PROVIDER_SITE_OTHER): Payer: 59

## 2022-10-11 ENCOUNTER — Telehealth: Payer: Self-pay | Admitting: Orthopedic Surgery

## 2022-10-11 ENCOUNTER — Ambulatory Visit (HOSPITAL_COMMUNITY): Payer: 59 | Admitting: Vascular Surgery

## 2022-10-11 ENCOUNTER — Ambulatory Visit (HOSPITAL_BASED_OUTPATIENT_CLINIC_OR_DEPARTMENT_OTHER): Payer: 59 | Admitting: Vascular Surgery

## 2022-10-11 ENCOUNTER — Telehealth: Payer: Self-pay | Admitting: Radiology

## 2022-10-11 ENCOUNTER — Encounter (HOSPITAL_COMMUNITY)
Admission: RE | Disposition: A | Discharge status: Home / Self Care | Payer: Self-pay | Source: Ambulatory Visit | Attending: Orthopedic Surgery

## 2022-10-11 ENCOUNTER — Encounter (HOSPITAL_COMMUNITY): Payer: Self-pay | Admitting: Orthopedic Surgery

## 2022-10-11 ENCOUNTER — Other Ambulatory Visit: Payer: Self-pay

## 2022-10-11 DIAGNOSIS — N189 Chronic kidney disease, unspecified: Secondary | ICD-10-CM | POA: Diagnosis not present

## 2022-10-11 DIAGNOSIS — E1169 Type 2 diabetes mellitus with other specified complication: Secondary | ICD-10-CM

## 2022-10-11 DIAGNOSIS — M869 Osteomyelitis, unspecified: Secondary | ICD-10-CM | POA: Diagnosis not present

## 2022-10-11 DIAGNOSIS — E1151 Type 2 diabetes mellitus with diabetic peripheral angiopathy without gangrene: Secondary | ICD-10-CM | POA: Diagnosis not present

## 2022-10-11 DIAGNOSIS — I130 Hypertensive heart and chronic kidney disease with heart failure and stage 1 through stage 4 chronic kidney disease, or unspecified chronic kidney disease: Secondary | ICD-10-CM | POA: Diagnosis not present

## 2022-10-11 DIAGNOSIS — E785 Hyperlipidemia, unspecified: Secondary | ICD-10-CM | POA: Diagnosis not present

## 2022-10-11 DIAGNOSIS — M86172 Other acute osteomyelitis, left ankle and foot: Secondary | ICD-10-CM | POA: Diagnosis not present

## 2022-10-11 DIAGNOSIS — I87323 Chronic venous hypertension (idiopathic) with inflammation of bilateral lower extremity: Secondary | ICD-10-CM | POA: Diagnosis not present

## 2022-10-11 DIAGNOSIS — I509 Heart failure, unspecified: Secondary | ICD-10-CM | POA: Diagnosis not present

## 2022-10-11 DIAGNOSIS — E11621 Type 2 diabetes mellitus with foot ulcer: Secondary | ICD-10-CM | POA: Insufficient documentation

## 2022-10-11 DIAGNOSIS — Z79899 Other long term (current) drug therapy: Secondary | ICD-10-CM | POA: Diagnosis not present

## 2022-10-11 DIAGNOSIS — I2510 Atherosclerotic heart disease of native coronary artery without angina pectoris: Secondary | ICD-10-CM | POA: Diagnosis not present

## 2022-10-11 DIAGNOSIS — Z7982 Long term (current) use of aspirin: Secondary | ICD-10-CM | POA: Diagnosis not present

## 2022-10-11 DIAGNOSIS — I252 Old myocardial infarction: Secondary | ICD-10-CM | POA: Diagnosis not present

## 2022-10-11 DIAGNOSIS — Z87891 Personal history of nicotine dependence: Secondary | ICD-10-CM

## 2022-10-11 DIAGNOSIS — L97529 Non-pressure chronic ulcer of other part of left foot with unspecified severity: Secondary | ICD-10-CM | POA: Diagnosis not present

## 2022-10-11 DIAGNOSIS — Z794 Long term (current) use of insulin: Secondary | ICD-10-CM | POA: Insufficient documentation

## 2022-10-11 DIAGNOSIS — Z955 Presence of coronary angioplasty implant and graft: Secondary | ICD-10-CM | POA: Insufficient documentation

## 2022-10-11 DIAGNOSIS — Z89511 Acquired absence of right leg below knee: Secondary | ICD-10-CM | POA: Diagnosis not present

## 2022-10-11 DIAGNOSIS — I110 Hypertensive heart disease with heart failure: Secondary | ICD-10-CM

## 2022-10-11 DIAGNOSIS — I700 Atherosclerosis of aorta: Secondary | ICD-10-CM | POA: Diagnosis not present

## 2022-10-11 DIAGNOSIS — L02612 Cutaneous abscess of left foot: Secondary | ICD-10-CM | POA: Insufficient documentation

## 2022-10-11 DIAGNOSIS — E1122 Type 2 diabetes mellitus with diabetic chronic kidney disease: Secondary | ICD-10-CM | POA: Diagnosis not present

## 2022-10-11 DIAGNOSIS — Z7985 Long-term (current) use of injectable non-insulin antidiabetic drugs: Secondary | ICD-10-CM | POA: Diagnosis not present

## 2022-10-11 DIAGNOSIS — J432 Centrilobular emphysema: Secondary | ICD-10-CM | POA: Diagnosis not present

## 2022-10-11 DIAGNOSIS — Z7984 Long term (current) use of oral hypoglycemic drugs: Secondary | ICD-10-CM | POA: Insufficient documentation

## 2022-10-11 HISTORY — PX: AMPUTATION: SHX166

## 2022-10-11 LAB — BASIC METABOLIC PANEL
Anion gap: 12 (ref 5–15)
BUN: 21 mg/dL (ref 8–23)
CO2: 23 mmol/L (ref 22–32)
Calcium: 8.8 mg/dL — ABNORMAL LOW (ref 8.9–10.3)
Chloride: 102 mmol/L (ref 98–111)
Creatinine, Ser: 1 mg/dL (ref 0.61–1.24)
GFR, Estimated: >60 mL/min (ref >60)
Glucose, Bld: 255 mg/dL — ABNORMAL HIGH (ref 70–99)
Potassium: 4.2 mmol/L (ref 3.5–5.1)
Sodium: 137 mmol/L (ref 135–145)

## 2022-10-11 LAB — GLUCOSE, CAPILLARY
Glucose-Capillary: 272 mg/dL — ABNORMAL HIGH (ref 70–99)
Glucose-Capillary: 213 mg/dL — ABNORMAL HIGH (ref 70–99)

## 2022-10-11 LAB — CBC
HCT: 36.6 % — ABNORMAL LOW (ref 39.0–52.0)
Hemoglobin: 11.8 g/dL — ABNORMAL LOW (ref 13.0–17.0)
MCH: 28.3 pg (ref 26.0–34.0)
MCHC: 32.2 g/dL (ref 30.0–36.0)
MCV: 87.8 fL (ref 80.0–100.0)
Platelets: 117 10*3/uL — ABNORMAL LOW (ref 150–400)
RBC: 4.17 MIL/uL — ABNORMAL LOW (ref 4.22–5.81)
RDW: 14.3 % (ref 11.5–15.5)
WBC: 3.1 10*3/uL — ABNORMAL LOW (ref 4.0–10.5)
nRBC: 0 % (ref 0.0–0.2)

## 2022-10-11 SURGERY — AMPUTATION DIGIT
Anesthesia: Regional | Laterality: Left

## 2022-10-11 MED ORDER — OXYCODONE-ACETAMINOPHEN 5-325 MG PO TABS: 1.0000 | ORAL_TABLET | ORAL | 0 refills | Status: DC | PRN

## 2022-10-11 MED ORDER — PROPOFOL 10 MG/ML IV BOLUS
INTRAVENOUS | Status: AC
  Filled 2022-10-11: qty 20

## 2022-10-11 MED ORDER — CHLORHEXIDINE GLUCONATE 0.12 % MT SOLN
OROMUCOSAL | Status: AC
  Administered 2022-10-11: 15 mL
  Filled 2022-10-11: qty 15

## 2022-10-11 MED ORDER — MIDAZOLAM HCL 2 MG/2ML IJ SOLN
INTRAMUSCULAR | Status: AC
  Filled 2022-10-11: qty 2

## 2022-10-11 MED ORDER — FENTANYL CITRATE (PF) 100 MCG/2ML IJ SOLN
INTRAMUSCULAR | Status: AC
  Administered 2022-10-11: 100 ug
  Filled 2022-10-11: qty 2

## 2022-10-11 MED ORDER — FENTANYL CITRATE (PF) 250 MCG/5ML IJ SOLN
INTRAMUSCULAR | Status: AC
  Filled 2022-10-11: qty 5

## 2022-10-11 MED ORDER — ORAL CARE MOUTH RINSE: 15.0000 mL | Freq: Once | OROMUCOSAL | Status: DC

## 2022-10-11 MED ORDER — ACETAMINOPHEN 10 MG/ML IV SOLN: 1000.0000 mg | Freq: Once | INTRAVENOUS | Status: DC | PRN

## 2022-10-11 MED ORDER — LIDOCAINE 2% (20 MG/ML) 5 ML SYRINGE
INTRAMUSCULAR | Status: DC | PRN
  Administered 2022-10-11: 40 mg via INTRAVENOUS

## 2022-10-11 MED ORDER — 0.9 % SODIUM CHLORIDE (POUR BTL) OPTIME
TOPICAL | Status: DC | PRN
  Administered 2022-10-11: 1000 mL

## 2022-10-11 MED ORDER — CLONIDINE HCL (ANALGESIA) 100 MCG/ML EP SOLN
EPIDURAL | Status: DC | PRN
  Administered 2022-10-11: 100 ug

## 2022-10-11 MED ORDER — ROPIVACAINE HCL 5 MG/ML IJ SOLN
INTRAMUSCULAR | Status: DC | PRN
  Administered 2022-10-11: 30 mL via PERINEURAL

## 2022-10-11 MED ORDER — CEFAZOLIN SODIUM-DEXTROSE 2-4 GM/100ML-% IV SOLN
2.0000 g | INTRAVENOUS | Status: DC
  Filled 2022-10-11: qty 100

## 2022-10-11 MED ORDER — CHLORHEXIDINE GLUCONATE 0.12 % MT SOLN: 15.0000 mL | Freq: Once | OROMUCOSAL | Status: DC

## 2022-10-11 MED ORDER — ONDANSETRON HCL 4 MG/2ML IJ SOLN: 4.0000 mg | Freq: Once | INTRAMUSCULAR | Status: DC | PRN

## 2022-10-11 MED ORDER — LACTATED RINGERS IV SOLN: INTRAVENOUS | Status: DC

## 2022-10-11 MED ORDER — PROPOFOL 500 MG/50ML IV EMUL
INTRAVENOUS | Status: DC | PRN
  Administered 2022-10-11: 100 ug/kg/min via INTRAVENOUS

## 2022-10-11 MED ORDER — INSULIN ASPART 100 UNIT/ML IJ SOLN
0.0000 [IU] | INTRAMUSCULAR | Status: DC | PRN
  Administered 2022-10-11: 4 [IU] via SUBCUTANEOUS
  Filled 2022-10-11: qty 1

## 2022-10-11 MED ORDER — FENTANYL CITRATE (PF) 100 MCG/2ML IJ SOLN: 25.0000 ug | INTRAMUSCULAR | Status: DC | PRN

## 2022-10-11 MED ORDER — MIDAZOLAM HCL 2 MG/2ML IJ SOLN
INTRAMUSCULAR | Status: AC
  Administered 2022-10-11: 2 mg
  Filled 2022-10-11: qty 2

## 2022-10-11 SURGICAL SUPPLY — 36 items
BAG COUNTER SPONGE SURGICOUNT (BAG) ×1 IMPLANT
BAG SPNG CNTER NS LX DISP (BAG)
BLADE LONG MED 31X9 (MISCELLANEOUS) IMPLANT
BLADE SURG 21 STRL SS (BLADE) ×1 IMPLANT
BNDG CMPR 9X4 STRL LF SNTH (GAUZE/BANDAGES/DRESSINGS)
BNDG COHESIVE 4X5 TAN STRL (GAUZE/BANDAGES/DRESSINGS) ×1 IMPLANT
BNDG COHESIVE 6X5 TAN NS LF (GAUZE/BANDAGES/DRESSINGS) IMPLANT
BNDG ESMARK 4X9 LF (GAUZE/BANDAGES/DRESSINGS) IMPLANT
BNDG GAUZE DERMACEA FLUFF 4 (GAUZE/BANDAGES/DRESSINGS) ×1 IMPLANT
BNDG GZE DERMACEA 4 6PLY (GAUZE/BANDAGES/DRESSINGS) ×1
COVER SURGICAL LIGHT HANDLE (MISCELLANEOUS) ×2 IMPLANT
DRAPE U-SHAPE 47X51 STRL (DRAPES) ×1 IMPLANT
DRSG ADAPTIC 3X8 NADH LF (GAUZE/BANDAGES/DRESSINGS) IMPLANT
DURAPREP 26ML APPLICATOR (WOUND CARE) ×1 IMPLANT
ELECT REM PT RETURN 9FT ADLT (ELECTROSURGICAL) ×1
ELECTRODE REM PT RTRN 9FT ADLT (ELECTROSURGICAL) ×1 IMPLANT
GAUZE PAD ABD 8X10 STRL (GAUZE/BANDAGES/DRESSINGS) ×1 IMPLANT
GAUZE SPONGE 4X4 12PLY STRL (GAUZE/BANDAGES/DRESSINGS) IMPLANT
GAUZE SPONGE 4X4 12PLY STRL LF (GAUZE/BANDAGES/DRESSINGS) IMPLANT
GLOVE BIOGEL PI IND STRL 9 (GLOVE) ×1 IMPLANT
GLOVE SURG ORTHO 9.0 STRL STRW (GLOVE) ×1 IMPLANT
GOWN STRL REUS W/ TWL XL LVL3 (GOWN DISPOSABLE) ×2 IMPLANT
GOWN STRL REUS W/TWL XL LVL3 (GOWN DISPOSABLE) ×2
GRAFT SKIN MARIGEN MICRO 38 (Tissue) IMPLANT
KIT BASIN OR (CUSTOM PROCEDURE TRAY) ×1 IMPLANT
KIT TURNOVER KIT B (KITS) ×1 IMPLANT
MANIFOLD NEPTUNE II (INSTRUMENTS) ×1 IMPLANT
NDL 22X1.5 STRL (OR ONLY) (MISCELLANEOUS) IMPLANT
NEEDLE 22X1.5 STRL (OR ONLY) (MISCELLANEOUS) IMPLANT
NS IRRIG 1000ML POUR BTL (IV SOLUTION) ×1 IMPLANT
PACK ORTHO EXTREMITY (CUSTOM PROCEDURE TRAY) ×1 IMPLANT
PAD ABD 8X10 STRL (GAUZE/BANDAGES/DRESSINGS) IMPLANT
PAD ARMBOARD 7.5X6 YLW CONV (MISCELLANEOUS) ×2 IMPLANT
SUT ETHILON 2 0 PSLX (SUTURE) ×1 IMPLANT
SYR CONTROL 10ML LL (SYRINGE) IMPLANT
TOWEL GREEN STERILE (TOWEL DISPOSABLE) ×1 IMPLANT

## 2022-10-11 NOTE — Telephone Encounter (Signed)
Pts wife Marylene Land called triage and lmom, states that he is bleeding thru the dressing. I spoke with [place], she advsied the change dressing and keep it elevated. I called and spoke with Marylene Land, she states that she doesn't have supplies to change the dressing. I asked if she could get him in the office and she said that they would have to call [facility] to see if they could pick him up.- she states that she will call us back and let us know.

## 2022-10-11 NOTE — H&P (Signed)
Adam Ray is an 68 y.o. male.   Chief Complaint: Osteomyelitis and ulceration left great toe and left second toe. HPI: Patient is a 67 year old gentleman with a right transtibial amputation. Patient has had overlapping of the great toe and second toe and using gauze to unload the pressure. Patient at this time has developed a full-thickness ulcer over the PIP joint of the second toe and a purulent abscess of the MTP joint of the great toe.   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   STEMI involving left circumflex coronary artery 07/12/2018   Coronary artery disease   Subacute osteomyelitis, right ankle and foot 01/29/2016   Testicular mass 04/18/2016    Past Surgical History:  Procedure Laterality Date   AMPUTATION Right 01/31/2016   Procedure: Right 2nd Toe Amputation;  Surgeon: [name], MD;  Location: [facility] OR;  Service: Orthopedics;  Laterality: Right;   AMPUTATION Right 07/08/2018   Procedure: RIGHT TRANSMETATARSAL AMPUTATION;  Surgeon: [name], MD;  Location: [facility] OR;  Service: Orthopedics;  Laterality: Right;   AMPUTATION Right 01/05/2020   Procedure: RIGHT BELOW KNEE AMPUTATION;  Surgeon: [name], MD;  Location: [facility] OR;  Service: Orthopedics;  Laterality: Right;   AMPUTATION Right 02/23/2020   Procedure: RIGHT BELOW KNEE AMPUTATION REVISION;  Surgeon: [name], MD;  Location: [facility] OR;  Service: Orthopedics;   Laterality: Right;   CATARACT EXTRACTION     right eye   COLONOSCOPY     CORONARY STENT INTERVENTION N/A 07/12/2018   Procedure: CORONARY STENT INTERVENTION;  Surgeon: Lennette Bihari, MD;  [facility];  Service: Cardiovascular;  Laterality: N/A;   CORONARY/GRAFT ACUTE MI REVASCULARIZATION N/A 07/12/2018   Procedure: Coronary/Graft Acute MI Revascularization;  Surgeon: Lennette Bihari, MD;  [facility];  Service: Cardiovascular;  Laterality: N/A;   I & D EXTREMITY  04/11/2012   Procedure: IRRIGATION AND DEBRIDEMENT EXTREMITY;  Surgeon: Toni Arthurs, MD;  [facility];  Service: Orthopedics;  Laterality: Right;   LEFT HEART CATH AND CORONARY ANGIOGRAPHY N/A 07/12/2018   Procedure: LEFT HEART CATH AND CORONARY ANGIOGRAPHY;  Surgeon: Lennette Bihari, MD;  [facility];  Service: Cardiovascular;  Laterality: N/A;   Surgery left great toe     Tear ducts bilateral eyes     TRANSMETATARSAL AMPUTATION Right 07/08/2018    Family History  Problem Relation Age of Onset   Diabetes Mother    Stroke Mother    Heart failure Father    Heart attack Father    Diabetes Sister        multiple siblings   Diabetes Brother        muliple siblings   Heart disease Brother  Colon cancer Neg Hx    Esophageal cancer Neg Hx    Rectal cancer Neg Hx    Stomach cancer Neg Hx    Social History:  reports that he quit smoking about 3 years ago. His smoking use included cigars and cigarettes. He started smoking about 59 years ago. He has a 14.40 pack-year smoking history. He has quit using smokeless tobacco. He reports current alcohol use. He reports that he does not use drugs.  Allergies:  Allergies  Allergen Reactions   Codeine Other (See Comments)    Heart attack.   Morphine Other (See Comments)    Patient preference   Propofol Other (See Comments)    Patient preference    Medications Prior to Admission  Medication Sig Dispense Refill   acetaminophen  (TYLENOL) 500 MG tablet Take 1,500 mg by mouth every 6 (six) hours as needed for moderate pain, mild pain or headache.     aspirin 81 MG chewable tablet Chew 81 mg by mouth daily.     Baclofen 5 MG TABS Take 5 mg by mouth 3 (three) times daily as needed. (Patient taking differently: Take 10 mg by mouth 3 (three) times daily as needed (pain).) 20 tablet 0   Cholecalciferol (VITAMIN D3) 50 MCG (2000 UT) TABS Take 2,000 Units by mouth daily.     CINNAMON PO Take 1,200 mg by mouth 2 (two) times daily. [place]     diclofenac Sodium (VOLTAREN ARTHRITIS PAIN) 1 % GEL Apply 4 g topically 4 (four) times daily. (Patient taking differently: Apply 4 g topically daily as needed (Pain).) 50 g 3   doxycycline (VIBRA-TABS) 100 MG tablet Take 1 tablet (100 mg total) by mouth 2 (two) times daily. 30 tablet 0   famotidine (PEPCID) 20 MG tablet Take 20 mg by mouth daily.     furosemide (LASIX) 20 MG tablet Take 1 tablet (20 mg total) by mouth 2 (two) times daily. 180 tablet 3   insulin aspart (NOVOLOG) 100 UNIT/ML injection Inject 40 Units into the skin 3 (three) times daily with meals.     insulin glargine (LANTUS) 100 UNIT/ML injection Inject 35 Units into the skin daily.     metFORMIN (GLUCOPHAGE) 500 MG tablet TAKE 1 TABLET BY MOUTH 2 TIMES DAILY WITH A MEAL. 180 tablet 1   metoprolol tartrate (LOPRESSOR) 25 MG tablet Take 12.5 mg by mouth 2 (two) times daily.     Multiple Vitamin (MULTIVITAMIN WITH MINERALS) TABS tablet Take 1 tablet by mouth daily.  iron     rosuvastatin (CRESTOR) 40 MG tablet Take 1 tablet (40 mg total) by mouth daily at 6 PM. (Patient taking differently: Take 20 mg by mouth daily at 6 PM.)     sildenafil (VIAGRA) 100 MG tablet Take 100 mg by mouth daily as needed for erectile dysfunction.     tamsulosin (FLOMAX) 0.4 MG CAPS capsule Take 1 capsule (0.4 mg total) by mouth daily. (Patient taking differently: Take 0.4 mg by mouth 2 (two) times daily.) 30 capsule 3   Vibegron (GEMTESA) 75 MG TABS  Take 75 mg by mouth daily.     acetaminophen (TYLENOL 8 HOUR) 650 MG CR tablet Take 1 tablet (650 mg total) by mouth every 8 (eight) hours as needed for pain. (Patient not taking: Reported on 10/10/2022) 30 tablet 0   Dulaglutide (TRULICITY) 1.5 MG/0.5ML SOPN Inject 1.5 mg into the skin once a week. (Patient not taking: Reported on 10/10/2022) 2 mL 3   Guaifenesin 100 MG PACK  Take 1 each by mouth daily.     ibuprofen (ADVIL) 800 MG tablet Take 1 tablet (800 mg total) by mouth 3 (three) times daily with meals as needed for headache or moderate pain. (Patient not taking: Reported on 10/10/2022) 30 tablet 3   tiZANidine (ZANAFLEX) 4 MG capsule Take 1 capsule (4 mg total) by mouth 3 (three) times daily. (Patient not taking: Reported on 10/10/2022) 15 capsule 0   Zoster Vaccine Adjuvanted [facility]) injection Administer Shingrix vaccination now and repeat in two months 1 each 1    No results found. However, due to the size of the patient record, not all encounters were searched. Please check Results Review for a complete set of results. No results found.  Review of Systems  All other systems reviewed and are negative.   Blood pressure 131/65, pulse 69, temperature 98.9 F (37.2 C), temperature source Oral, resp. rate 18, height 5\' 10"  (1.778 m), weight 80.7 kg, SpO2 98 %. Physical Exam  Patient is alert, oriented, no adenopathy, well-dressed, normal affect, normal respiratory effort. Examination patient has developed a new full-thickness ulcer of the second toe with exposed bone of the PIP joint in the first webspace.  The great toe is overlapping the second toe and has a large ulcer over the first metatarsal head.  After informed consent a 10 blade knife was used to debride the skin and soft tissue down to a deep purulent abscess.  This was decompressed.  After debridement the ulcer is 2 cm in diameter.  Recent ankle-brachial indices shows multiphasic flow with good circulation on the  left. Assessment/Plan 1. Ulcer of left great toe due to diabetes mellitus   2. Idiopathic chronic venous hypertension of both lower extremities with inflammation   3. Peripheral vascular disease   4. Osteomyelitis of second toe of left foot       Plan: With the abscess of the great toe and exposed bone of the second toe have recommended proceeding with amputation of the great toe and second toe.  Risk and benefits were discussed including risk of the wound not healing need for additional surgery.  Patient and his wife state they understand and wish to proceed at this time a prescription for doxycycline is called in.   [name], MD 10/11/2022, 6:42 AM

## 2022-10-11 NOTE — Op Note (Signed)
10/11/2022  8:42 AM  PATIENT:  Adam Ray    PRE-OPERATIVE DIAGNOSIS:  Abscess Left Great Toe, Osteomyelitis 2nd Toe  POST-OPERATIVE DIAGNOSIS:  Same  PROCEDURE:  LEFT FOOT FIRST RAY AMPUTATION AND LEFT FOOT SECOND RAY AMPUTATION Application Kerecis micro graft 38 cm.  SURGEON:  [name], MD  PHYSICIAN ASSISTANT:None ANESTHESIA:   General  PREOPERATIVE INDICATIONS:  Adam Ray is a  67 y.o. male with a diagnosis of Abscess Left Great Toe, Osteomyelitis 2nd Toe who failed conservative measures and elected for surgical management.    The risks benefits and alternatives were discussed with the patient preoperatively including but not limited to the risks of infection, bleeding, nerve injury, cardiopulmonary complications, the need for revision surgery, among others, and the patient was willing to proceed.  OPERATIVE IMPLANTS:   Implant Name Type Inv. Item Serial No. Manufacturer Lot No. LRB No. Used Action  GRAFT SKIN MARIGEN MICRO 38 - [id number] Tissue GRAFT SKIN MARIGEN MICRO 38  [employer] [id number] Left 1 Implanted    @ENCIMAGES @  OPERATIVE FINDINGS: Tissue margins were clear significant amount of fluid in the soft tissue.  Good petechial bleeding with calcified arteries.  OPERATIVE PROCEDURE: Patient was brought the operating room underwent general anesthetic.  After adequate levels anesthesia were obtained patient's left lower extremity was prepped using DuraPrep draped into a sterile field a timeout was called.  A racquet incision was made around the first and second ray to incorporate the ulcerative tissue beneath the first metatarsal head.  A oscillating saw was used to perform a first and second ray amputation.  The wound was 4 x 10 cm.  Electrocautery was used for hemostasis the wound was irrigated with normal saline.  The wound bed was filled with 38 cm of Kerecis micro graft for a wound surface area of 40 cm.  Local tissue rearrangement was used to close the  wound 4 x 10 cm with 2-0 nylon.  The wound margins were undermined to allow for tissue advancement.  A sterile dressing was applied patient was taken the PACU in stable condition.   DISCHARGE PLANNING:  Antibiotic duration: Preoperative antibiotics  Weightbearing: Minimize weightbearing on the left  Pain medication: Prescription for Percocet  Dressing care/ Wound VAC: Dry dressing.  Ambulatory devices: Walker or crutches.  Discharge to: Home.  Follow-up: In the office 1 week post operative.

## 2022-10-11 NOTE — Progress Notes (Unsigned)
Patient came in the office today on nurse schedule due to bleeding out of his surgical bandage. I did not cut open the surgical bandage but did apply more gauze over top and coban. I told them that if it keeps draining then he needed to go to the ER. He stated he was not elevating his leg at heart level and he has already been walking on foot. He is s/p first and second toe amputation this morning 10/11/22. I explained this is the reason it is bleeding so much. This incision can open up and cause more issues later with delayed healing. He understood.

## 2022-10-11 NOTE — Telephone Encounter (Signed)
Noted. I had also stated if they continue to see more bleeding from outside of dressing that they need to go to the ER.

## 2022-10-11 NOTE — Anesthesia Procedure Notes (Signed)
Anesthesia Regional Block: Popliteal block   Pre-Anesthetic Checklist: , timeout performed,  Correct Patient, Correct Site, Correct Laterality,  Correct Procedure, Correct Position, site marked,  Risks and benefits discussed,  Pre-op evaluation,  At surgeon's request and post-op pain management  Laterality: Lower and Left  Prep: Maximum Sterile Barrier Precautions used, chloraprep       Needles:  Injection technique: Single-shot  Needle Type: Echogenic Needle     Needle Length: 9cm  Needle Gauge: 21     Additional Needles:   Procedures:,,,, ultrasound used (permanent image in chart),,    Narrative:  Start time: 10/11/2022 7:25 AM End time: 10/11/2022 7:30 AM Injection made incrementally with aspirations every 5 mL.  Performed by: Personally  Anesthesiologist: Trevor Iha, MD  Additional Notes: Block assessed. Patient tolerated procedure well.

## 2022-10-11 NOTE — Telephone Encounter (Signed)
Patient's wife says he is on the way to get his feet wrapped. Her call back number is [phone]

## 2022-10-15 ENCOUNTER — Encounter (HOSPITAL_COMMUNITY): Payer: Self-pay | Admitting: Orthopedic Surgery

## 2022-10-15 NOTE — Transfer of Care (Signed)
Immediate Anesthesia Transfer of Care Note  Patient: Adam Ray  Procedure(s) Performed: LEFT GREAT TOE AMPUTATION AND LEFT 2ND TOE AMPUTATION (Left)  Patient Location: PACU  Anesthesia Type:MAC combined with regional for post-op pain  Level of Consciousness: awake  Airway & Oxygen Therapy: Patient Spontanous Breathing  Post-op Assessment: Report given to RN  Post vital signs: stable  Last Vitals:  Vitals Value Taken Time  BP 103/57 10/11/22 1000  Temp 36.5 C 10/11/22 1000  Pulse 57 10/11/22 1000  Resp 22 10/11/22 1000  SpO2 97 % 10/11/22 1000    Last Pain:  Vitals:   10/11/22 1000  TempSrc:   PainSc: 0-No pain         Complications: No notable events documented.

## 2022-10-15 NOTE — Addendum Note (Signed)
Addendum  created 10/15/22 1633 by Trevor Iha, MD   Intraprocedure Staff edited

## 2022-10-15 NOTE — Anesthesia Postprocedure Evaluation (Signed)
Anesthesia Post Note  Patient: Adam Ray  Procedure(s) Performed: LEFT GREAT TOE AMPUTATION AND LEFT 2ND TOE AMPUTATION (Left)     Patient location during evaluation: PACU Anesthesia Type: Regional and MAC Level of consciousness: awake and alert Pain management: pain level controlled Vital Signs Assessment: post-procedure vital signs reviewed and stable Respiratory status: spontaneous breathing, nonlabored ventilation, respiratory function stable and patient connected to nasal cannula oxygen Cardiovascular status: stable and blood pressure returned to baseline Postop Assessment: no apparent nausea or vomiting Anesthetic complications: no  No notable events documented.  Last Vitals:  Vitals:   10/11/22 0948 10/11/22 1000  BP: (!) 110/56 (!) 103/57  Pulse: (!) 59 (!) 57  Resp: (!) 26 (!) 22  Temp:  36.5 C  SpO2: 98% 97%    Last Pain:  Vitals:   10/11/22 1000  TempSrc:   PainSc: 0-No pain                 Trevor Iha

## 2022-10-17 ENCOUNTER — Ambulatory Visit: Payer: No Typology Code available for payment source | Admitting: Orthopedic Surgery

## 2022-10-17 DIAGNOSIS — M869 Osteomyelitis, unspecified: Secondary | ICD-10-CM

## 2022-10-17 DIAGNOSIS — L97529 Non-pressure chronic ulcer of other part of left foot with unspecified severity: Secondary | ICD-10-CM

## 2022-10-17 DIAGNOSIS — E11621 Type 2 diabetes mellitus with foot ulcer: Secondary | ICD-10-CM

## 2022-10-17 MED ORDER — DOXYCYCLINE HYCLATE 100 MG PO TABS: 100.0000 mg | ORAL_TABLET | Freq: Two times a day (BID) | ORAL | 0 refills | Status: DC

## 2022-10-21 ENCOUNTER — Telehealth: Payer: Self-pay | Admitting: Orthopedic Surgery

## 2022-10-21 ENCOUNTER — Ambulatory Visit: Payer: 59 | Admitting: Podiatry

## 2022-10-21 NOTE — Telephone Encounter (Signed)
Patient's wife called. Says his foot looks infected. Wants him seen tomorrow. Her call back number is [phone]

## 2022-10-21 NOTE — Telephone Encounter (Signed)
I called and made and appt for tomorrow with Dr. Lajoyce Corners at 2:30

## 2022-10-22 ENCOUNTER — Encounter: Payer: Self-pay | Admitting: Orthopedic Surgery

## 2022-10-22 ENCOUNTER — Ambulatory Visit (INDEPENDENT_AMBULATORY_CARE_PROVIDER_SITE_OTHER): Payer: No Typology Code available for payment source | Admitting: Orthopedic Surgery

## 2022-10-22 DIAGNOSIS — I739 Peripheral vascular disease, unspecified: Secondary | ICD-10-CM

## 2022-10-22 DIAGNOSIS — B351 Tinea unguium: Secondary | ICD-10-CM

## 2022-10-22 DIAGNOSIS — M869 Osteomyelitis, unspecified: Secondary | ICD-10-CM

## 2022-10-22 DIAGNOSIS — Z89511 Acquired absence of right leg below knee: Secondary | ICD-10-CM

## 2022-10-22 DIAGNOSIS — I872 Venous insufficiency (chronic) (peripheral): Secondary | ICD-10-CM

## 2022-10-22 DIAGNOSIS — L97529 Non-pressure chronic ulcer of other part of left foot with unspecified severity: Secondary | ICD-10-CM

## 2022-10-22 DIAGNOSIS — I890 Lymphedema, not elsewhere classified: Secondary | ICD-10-CM

## 2022-10-22 DIAGNOSIS — E11621 Type 2 diabetes mellitus with foot ulcer: Secondary | ICD-10-CM
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
## 2022-10-23 DIAGNOSIS — N289 Disorder of kidney and ureter, unspecified: Secondary | ICD-10-CM | POA: Diagnosis not present

## 2022-10-23 DIAGNOSIS — R310 Gross hematuria: Secondary | ICD-10-CM | POA: Diagnosis not present

## 2022-10-23 DIAGNOSIS — N3289 Other specified disorders of bladder: Secondary | ICD-10-CM | POA: Diagnosis not present

## 2022-10-23 DIAGNOSIS — N200 Calculus of kidney: Secondary | ICD-10-CM | POA: Diagnosis not present

## 2022-10-24 ENCOUNTER — Encounter: Payer: No Typology Code available for payment source | Admitting: Orthopedic Surgery

## 2022-10-25 ENCOUNTER — Other Ambulatory Visit: Payer: Self-pay | Admitting: Orthopedic Surgery

## 2022-10-27 ENCOUNTER — Encounter: Payer: Self-pay | Admitting: Orthopedic Surgery

## 2022-10-27 NOTE — Progress Notes (Signed)
Office Visit Note   Patient: Adam Ray           Date of Birth: Jul 31, 1955           MRN: [id number] Visit Date: 10/22/2022              Requested by: Latrelle Dodrill, MD [address] [place],  [place] [address] PCP: Latrelle Dodrill, MD  Chief Complaint  Patient presents with   Left Foot - Routine Post Op    10/11/2022 left first ray amputation and 2nd toe amputation       HPI: Patient is a 67 year old gentleman who is status post left first ray and second toe amputation 1 week ago.  Patient's wife states she is worried that there is a foot infection and would like the toenails trimmed.  Worried about the swelling in the leg.  Patient has not been using his pumps.  Assessment & Plan: Visit Diagnoses:  1. Peripheral vascular disease (HCC)   2. Lymphedema due to venous insufficiency   3. Acquired absence of right leg below knee (HCC)   4. Onychomycosis   5. Osteomyelitis of second toe of left foot (HCC)   6. Ulcer of left great toe due to diabetes mellitus (HCC)     Plan: Nails were trimmed x 3.  Discussed the importance of elevation and compression and use of the pumps.  Follow-Up Instructions: No follow-ups on file.   Ortho Exam  Patient is alert, oriented, no adenopathy, well-dressed, normal affect, normal respiratory effort. Examination there is significant pitting edema in the left lower extremity.  There is slight wound dehiscence from swelling there is no cellulitis.  No odor or drainage.  Imaging: No results found.   Labs: Lab Results  Component Value Date   HGBA1C 7.9 (A) 07/05/2022   HGBA1C 9.4 (H) 01/17/2022   HGBA1C 7.8 (A) 09/14/2021   ESRSEDRATE 54 (H) 01/01/2020   ESRSEDRATE 8 01/29/2016   ESRSEDRATE 9 04/11/2012   CRP 2.6 (H) 01/29/2016   CRP 0.5 02/28/2014   REPTSTATUS 01/18/2020 FINAL 01/17/2020   REPTSTATUS 01/22/2020 FINAL 01/17/2020   REPTSTATUS 01/22/2020 FINAL 01/17/2020   GRAMSTAIN  04/11/2012    NO WBC SEEN RARE  SQUAMOUS EPITHELIAL CELLS PRESENT MODERATE GRAM POSITIVE COCCI IN PAIRS RARE GRAM NEGATIVE RODS   GRAMSTAIN  04/11/2012    NO WBC SEEN RARE SQUAMOUS EPITHELIAL CELLS PRESENT MODERATE GRAM POSITIVE COCCI IN PAIRS RARE GRAM NEGATIVE RODS   CULT (A) 01/17/2020    <10,000 COLONIES/mL INSIGNIFICANT GROWTH Performed at [facility], [address], [place], [place] [address]    CULT  01/17/2020    NO GROWTH 5 DAYS Performed at [facility], [address] [place], [place] [address]    CULT  01/17/2020    NO GROWTH 5 DAYS Performed at [facility], [address] [place], [place] [address]    LABORGA NO GROWTH 2 DAYS 07/19/2014     Lab Results  Component Value Date   ALBUMIN 4.5 01/17/2022   ALBUMIN 4.1 12/06/2020   ALBUMIN 4.3 12/01/2020    No results found for: "MG" No results found for: "VD25OH"  No results found for: "PREALBUMIN"    Latest Ref Rng & Units 10/11/2022    8:09 AM 01/17/2022    3:14 PM 12/06/2020    4:42 PM  CBC EXTENDED  WBC 4.0 - 10.5 K/uL 3.1  3.8  5.5   RBC 4.22 - 5.81 MIL/uL  4.17  4.64  4.90   Hemoglobin 13.0 - 17.0 g/dL 16.1  09.6  04.5   HCT 39.0 - 52.0 % 36.6  39.6  39.8   Platelets 150 - 400 K/uL 117  104  138      There is no height or weight on file to calculate BMI.  Orders:  No orders of the defined types were placed in this encounter.  No orders of the defined types were placed in this encounter.    Procedures: No procedures performed  Clinical Data: No additional findings.  ROS:  All other systems negative, except as noted in the HPI. Review of Systems  Objective: Vital Signs: There were no vitals taken for this visit.  Specialty Comments:  No specialty comments available.  PMFS History: Patient Active Problem List   Diagnosis Date Noted   Osteomyelitis of second toe of left foot (HCC) 10/11/2022   Osteomyelitis of great toe of left foot (HCC) 10/11/2022   Complete traumatic amputation at level  between knee and ankle (HCC) 10/02/2022   Atrophy of tongue papillae 10/02/2022   Atherosclerosis of coronary artery without angina pectoris 10/02/2022   Age-related nuclear cataract of left eye 10/02/2022   Type 2 diabetes mellitus with neurological complications (HCC) 10/02/2022   Brown hairy tongue 10/02/2022   Proliferative diabetic retinopathy of right eye associated with type 2 diabetes mellitus (HCC) 10/02/2022   Type 2 diabetes mellitus with diabetic chronic kidney disease (HCC) 10/02/2022   Tongue discoloration 02/14/2022   Hematuria 08/16/2021   Cervical spondylosis without myelopathy 12/14/2020   Pain in joint of left shoulder 12/14/2020   Urinary incontinence 08/10/2020   Increased urinary frequency 05/09/2020   Nocturnal leg cramps 10/28/2019   Hyperlipidemia associated with type 2 diabetes mellitus (HCC) 05/26/2019   Limited mobility 05/12/2018   Anxiety state 06/02/2017   Leg swelling 01/29/2017   Idiopathic chronic venous hypertension of both lower extremities with inflammation 09/24/2016   Diabetic polyneuropathy associated with type 2 diabetes mellitus (HCC)    Type 2 diabetes mellitus with neurologic complication, with long-term current use of insulin (HCC) 12/08/2015   Left shoulder pain 08/10/2015   Pulmonary nodule 02/01/2015   Depression 12/09/2013   Warts, genital 08/20/2013   Diabetic retinopathy (HCC) 01/28/2013   Sleep apnea 09/06/2010   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   Hypertension associated with diabetes (HCC) 11/09/2008   COPD, mild (HCC) 10/06/2006   SICKLE-CELL TRAIT 04/26/2006   ERECTILE DYSFUNCTION 04/26/2006   Tobacco use 04/26/2006   Peripheral arterial disease (HCC) 04/26/2006   Allergic rhinitis 04/26/2006   Past Medical History:  Diagnosis Date   Allergy    Arthritis    Bronchitis    Cataract    CHF (congestive heart failure) (HCC)    Chronic kidney disease    Claudication (HCC)    right foot ray resection   Colon polyps     hyperplastic   COPD (chronic obstructive pulmonary disease) (HCC)    Coronary artery disease    Diabetes mellitus    type II   Genital warts    Gout    Hyperlipidemia    Hypertension    Myocardial infarction (HCC)    Osteomyelitis of third toe of right foot (HCC)    Pneumonia    Status post amputation of toe of right foot (HCC) 09/24/2016   Status post transmetatarsal amputation of foot, 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/2018   STEMI involving left circumflex coronary artery (HCC) 07/12/2018   Coronary artery disease  Subacute osteomyelitis, right ankle and foot (HCC) 01/29/2016   Testicular mass 04/18/2016    Family History  Problem Relation Age of Onset   Diabetes Mother    Stroke Mother    Heart failure Father    Heart attack Father    Diabetes Sister        multiple siblings   Diabetes Brother        muliple siblings   Heart disease Brother    Colon cancer Neg Hx    Esophageal cancer Neg Hx    Rectal cancer Neg Hx    Stomach cancer Neg Hx     Past Surgical History:  Procedure Laterality Date   AMPUTATION Right 01/31/2016   Procedure: Right 2nd Toe Amputation;  Surgeon: [name], MD;  Location: [facility] OR;  Service: Orthopedics;  Laterality: Right;   AMPUTATION Right 07/08/2018   Procedure: RIGHT TRANSMETATARSAL AMPUTATION;  Surgeon: [name], MD;  Location: [facility] OR;  Service: Orthopedics;  Laterality: Right;   AMPUTATION Right 01/05/2020   Procedure: RIGHT BELOW KNEE AMPUTATION;  Surgeon: [name], MD;  Location: [facility] OR;  Service: Orthopedics;  Laterality: Right;   AMPUTATION Right 02/23/2020   Procedure: RIGHT BELOW KNEE AMPUTATION REVISION;  Surgeon: [name], MD;  Location: [facility] OR;  Service: Orthopedics;  Laterality: Right;   AMPUTATION Left 10/11/2022   Procedure: LEFT GREAT TOE AMPUTATION AND LEFT 2ND TOE AMPUTATION;  Surgeon: [name], MD;  Location: [facility] OR;  Service: Orthopedics;  Laterality: Left;   CATARACT EXTRACTION     right eye   COLONOSCOPY     CORONARY  STENT INTERVENTION N/A 07/12/2018   Procedure: CORONARY STENT INTERVENTION;  Surgeon: Lennette Bihari, MD;  [facility]: [facility];  Service: Cardiovascular;  Laterality: N/A;   CORONARY/GRAFT ACUTE MI REVASCULARIZATION N/A 07/12/2018   Procedure: Coronary/Graft Acute MI Revascularization;  Surgeon: Lennette Bihari, MD;  Location: [facility];  Service: Cardiovascular;  Laterality: N/A;   I & D EXTREMITY  04/11/2012   Procedure: IRRIGATION AND DEBRIDEMENT EXTREMITY;  Surgeon: Toni Arthurs, MD;  Location: [facility] OR;  Service: Orthopedics;  Laterality: Right;   LEFT HEART CATH AND CORONARY ANGIOGRAPHY N/A 07/12/2018   Procedure: LEFT HEART CATH AND CORONARY ANGIOGRAPHY;  Surgeon: Lennette Bihari, MD;  Location: [facility];  Service: Cardiovascular;  Laterality: N/A;   Surgery left great toe     Tear ducts bilateral eyes     TRANSMETATARSAL AMPUTATION Right 07/08/2018   Social History   Occupational History   Occupation: disabled  Tobacco Use   Smoking status: Former    Packs/day: 0.30    Years: 48.00    Additional pack years: 0.00    Total pack years: 14.40    Types: Cigars, Cigarettes    Start date: 07/02/1963    Quit date: 06/01/2019    Years since quitting: 3.4   Smokeless tobacco: Former  Building services engineer Use: Former  Substance and Sexual Activity   Alcohol use: Yes    Alcohol/week: 0.0 standard drinks of alcohol   Drug use: No   Sexual activity: Not on file

## 2022-11-05 ENCOUNTER — Ambulatory Visit (INDEPENDENT_AMBULATORY_CARE_PROVIDER_SITE_OTHER): Payer: 59 | Admitting: Orthopedic Surgery

## 2022-11-05 DIAGNOSIS — I739 Peripheral vascular disease, unspecified: Secondary | ICD-10-CM

## 2022-11-05 DIAGNOSIS — I890 Lymphedema, not elsewhere classified: Secondary | ICD-10-CM

## 2022-11-05 DIAGNOSIS — I872 Venous insufficiency (chronic) (peripheral): Secondary | ICD-10-CM

## 2022-11-05 DIAGNOSIS — I87323 Chronic venous hypertension (idiopathic) with inflammation of bilateral lower extremity: Secondary | ICD-10-CM

## 2022-11-08 ENCOUNTER — Ambulatory Visit (INDEPENDENT_AMBULATORY_CARE_PROVIDER_SITE_OTHER): Payer: No Typology Code available for payment source | Admitting: Family

## 2022-11-08 ENCOUNTER — Encounter: Payer: Self-pay | Admitting: Family

## 2022-11-08 DIAGNOSIS — I890 Lymphedema, not elsewhere classified: Secondary | ICD-10-CM

## 2022-11-08 DIAGNOSIS — I739 Peripheral vascular disease, unspecified: Secondary | ICD-10-CM

## 2022-11-08 DIAGNOSIS — M869 Osteomyelitis, unspecified: Secondary | ICD-10-CM

## 2022-11-08 DIAGNOSIS — I872 Venous insufficiency (chronic) (peripheral): Secondary | ICD-10-CM

## 2022-11-08 NOTE — Progress Notes (Signed)
Post-Op Visit Note   Patient: Adam Ray           Date of Birth: 1956/04/26           MRN: [id number] Visit Date: 11/08/2022 PCP: Latrelle Dodrill, MD  Chief Complaint:  Chief Complaint  Patient presents with   Left Foot - Routine Post Op    10/11/2022 left GT and second toe amputation     HPI:  HPI The patient is a 67 year old gentleman seen status post left great toe and second toe amputation he is in a power scooter today.  He did have a compression wrap on which unfortunately is soaked with urine today. Ortho Exam On examination of the left foot incision this did dehisce it is healing slowly there the area of the dehiscence is 3 cm x 1 cm filled in with 50% fibrinous exudative tissue there is no surrounding erythema or sign of infection no purulence  Visit Diagnoses: No diagnosis found.  Plan: Sutures harvested today.  Begin daily Dial soap cleansing.  Elevate and wrap with Ace for compression continue lymphedema pumps.  Follow-Up Instructions: No follow-ups on file.   Imaging: No results found.  Orders:  No orders of the defined types were placed in this encounter.  No orders of the defined types were placed in this encounter.    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   Atrophy of tongue papillae 10/02/2022   Atherosclerosis of coronary artery without angina pectoris 10/02/2022   Age-related nuclear cataract of left eye 10/02/2022   Type 2 diabetes mellitus with neurological complications (HCC) 10/02/2022   Brown hairy tongue 10/02/2022   Proliferative diabetic retinopathy of right eye associated with type 2 diabetes mellitus (HCC) 10/02/2022   Type 2 diabetes mellitus with diabetic chronic kidney disease (HCC) 10/02/2022   Tongue  discoloration 02/14/2022   Hematuria 08/16/2021   Cervical spondylosis without myelopathy 12/14/2020   Pain in joint of left shoulder 12/14/2020   Urinary incontinence 08/10/2020   Increased urinary frequency 05/09/2020   Nocturnal leg cramps 10/28/2019   Hyperlipidemia associated with type 2 diabetes mellitus (HCC) 05/26/2019   Limited mobility 05/12/2018   Anxiety state 06/02/2017   Leg swelling 01/29/2017   Idiopathic chronic venous hypertension of both lower extremities with inflammation 09/24/2016   Diabetic polyneuropathy associated with type 2 diabetes mellitus (HCC)    Type 2 diabetes mellitus with neurologic complication, with long-term current use of insulin (HCC) 12/08/2015   Left shoulder pain 08/10/2015   Pulmonary nodule 02/01/2015   Depression 12/09/2013   Warts, genital 08/20/2013   Diabetic retinopathy (HCC) 01/28/2013   Sleep apnea 09/06/2010   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   Hypertension associated with diabetes (HCC) 11/09/2008   COPD, mild (HCC) 10/06/2006   SICKLE-CELL TRAIT 04/26/2006   ERECTILE DYSFUNCTION 04/26/2006   Tobacco use 04/26/2006   Peripheral arterial disease (HCC) 04/26/2006   Allergic rhinitis 04/26/2006   Past Medical History:  Diagnosis Date   Allergy    Arthritis    Bronchitis    Cataract    CHF (congestive heart failure) (HCC)    Chronic kidney disease    Claudication (HCC)    right foot ray resection   Colon polyps    hyperplastic   COPD (  chronic obstructive pulmonary disease) (HCC)    Coronary artery disease    Diabetes mellitus    type II   Genital warts    Gout    Hyperlipidemia    Hypertension    Myocardial infarction (HCC)    Osteomyelitis of third toe of right foot (HCC)    Pneumonia    Status post amputation of toe of right foot (HCC) 09/24/2016   Status post transmetatarsal amputation of foot, 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/2018   STEMI involving left circumflex coronary artery (HCC) 07/12/2018   Coronary artery disease    Subacute osteomyelitis, right ankle and foot (HCC) 01/29/2016   Testicular mass 04/18/2016    Family History  Problem Relation Age of Onset   Diabetes Mother    Stroke Mother    Heart failure Father    Heart attack Father    Diabetes Sister        multiple siblings   Diabetes Brother        muliple siblings   Heart disease Brother    Colon cancer Neg Hx    Esophageal cancer Neg Hx    Rectal cancer Neg Hx    Stomach cancer Neg Hx     Past Surgical History:  Procedure Laterality Date   AMPUTATION Right 01/31/2016   Procedure: Right 2nd Toe Amputation;  Surgeon: [name], MD;  Location: [facility] OR;  Service: Orthopedics;  Laterality: Right;   AMPUTATION Right 07/08/2018   Procedure: RIGHT TRANSMETATARSAL AMPUTATION;  Surgeon: [name], MD;  Location: [facility] OR;  Service: Orthopedics;  Laterality: Right;   AMPUTATION Right 01/05/2020   Procedure: RIGHT BELOW KNEE AMPUTATION;  Surgeon: [name], MD;  Location: [facility] OR;  Service: Orthopedics;  Laterality: Right;   AMPUTATION Right 02/23/2020   Procedure: RIGHT BELOW KNEE AMPUTATION REVISION;  Surgeon: [name], MD;  Location: [facility] OR;  Service: Orthopedics;  Laterality: Right;   AMPUTATION Left 10/11/2022   Procedure: LEFT GREAT TOE AMPUTATION AND LEFT 2ND TOE AMPUTATION;  Surgeon: [name], MD;  Location: [facility] OR;  Service: Orthopedics;  Laterality: Left;   CATARACT EXTRACTION     right eye   COLONOSCOPY     CORONARY STENT INTERVENTION N/A 07/12/2018   Procedure: CORONARY STENT INTERVENTION;  Surgeon: Lennette Bihari, MD;  [facility];  Service: Cardiovascular;  Laterality: N/A;   CORONARY/GRAFT ACUTE MI REVASCULARIZATION N/A 07/12/2018   Procedure: Coronary/Graft Acute MI Revascularization;  Surgeon: Lennette Bihari, MD;  [facility]: [facility];  Service: Cardiovascular;  Laterality: N/A;   I & D EXTREMITY  04/11/2012   Procedure: IRRIGATION AND DEBRIDEMENT EXTREMITY;  Surgeon: Toni Arthurs, MD;   Location: [facility] OR;  Service: Orthopedics;  Laterality: Right;   LEFT HEART CATH AND CORONARY ANGIOGRAPHY N/A 07/12/2018   Procedure: LEFT HEART CATH AND CORONARY ANGIOGRAPHY;  Surgeon: Lennette Bihari, MD;  [facility];  Service: Cardiovascular;  Laterality: N/A;   Surgery left great toe     Tear ducts bilateral eyes     TRANSMETATARSAL AMPUTATION Right 07/08/2018   Social History   Occupational History   Occupation: disabled  Tobacco Use   Smoking status: Former    Packs/day: 0.30    Years: 48.00    Additional pack years: 0.00    Total pack years: 14.40    Types: Cigars, Cigarettes    Start date: 07/02/1963    Quit date: 06/01/2019    Years since quitting: 3.4  Smokeless tobacco: Former  Building services engineer Use: Former  Substance and Sexual Activity   Alcohol use: Yes    Alcohol/week: 0.0 standard drinks of alcohol   Drug use: No   Sexual activity: Not on file

## 2022-11-15 ENCOUNTER — Telehealth: Payer: Self-pay | Admitting: Student

## 2022-11-15 NOTE — Telephone Encounter (Signed)
**  After Hours/ Emergency Line Call**  Received a page to call [phone]) - [phone].  Patient: Adam Ray  Caller:  Wife of patient Confirmed name & DOB of patient with caller  Subjective:  Called by wife who notes patient has been sleeping a lot and has an insulin of 297. Husband reports that he is taking his Novolog 40 units TID w/ meals. Also reports taking 30 units of his lantus. Notes that he took 70 units of lantus yesterday and got hypoglycemic to the 20's and they had to call EMS.   Objective:  Observations: NAD, conversant   Assessment & Plan  Adam Ray is a 67 y.o. male with PMHx s/f Diabetes who calls with the following complaints and concerns: hypoglycemia. Patient notes they have been taking more insulin than prescribed and has been taking SAI and not eating. Patient check CBG on the phone and it was 183, after haven taken 40 units around 3 pm. Patient encouraged to eat w/ SAI, and to take insulin as prescribed. Patient scheduled for f/u appointment on Monday at 9:30 am. Patient counseled on eating for CBG under 70, and not eating if CBG above 250. Patient's DM regimen is:  Novolog 40 units TID w/ meals  Lantus 35 units daily   Recommendations:  F/u in clinic   -- Red flags discussed.   -- Will forward to PCP.  Bess Kinds, MD [facility], PGY-1

## 2022-11-18 ENCOUNTER — Ambulatory Visit: Payer: No Typology Code available for payment source | Admitting: Family Medicine

## 2022-11-18 ENCOUNTER — Ambulatory Visit (INDEPENDENT_AMBULATORY_CARE_PROVIDER_SITE_OTHER): Payer: 59 | Admitting: Family Medicine

## 2022-11-18 VITALS — BP 115/72 | HR 75 | Ht 70.0 in

## 2022-11-18 DIAGNOSIS — E1141 Type 2 diabetes mellitus with diabetic mononeuropathy: Secondary | ICD-10-CM

## 2022-11-18 DIAGNOSIS — Z794 Long term (current) use of insulin: Secondary | ICD-10-CM | POA: Diagnosis not present

## 2022-11-18 LAB — POCT GLYCOSYLATED HEMOGLOBIN (HGB A1C): HbA1c, POC (controlled diabetic range): 6.6 % (ref 0.0–7.0)

## 2022-11-18 MED ORDER — INSULIN GLARGINE 100 UNIT/ML ~~LOC~~ SOLN: 10.0000 [IU] | Freq: Every day | SUBCUTANEOUS | Status: DC

## 2022-11-18 NOTE — Patient Instructions (Signed)
It was great to see you!  STOP your Novolog.  DECREASE your Glargine (Lantus) to 10 units daily. Continue your Metformin.  Continue checking your sugar 3 times daily.  TAKE YOUR INSULIN EXACTLY AS PRESCRIBED. Do not self-adjust. This is very dangerous.  Follow up on 5/28 (see below).   Take care, Dr Anner Crete

## 2022-11-18 NOTE — Assessment & Plan Note (Addendum)
A1c 6.6%. Having frequent and profound hypoglycemic episodes. Likely related to improper insulin use. -STOP novolog -Decrease glargine to 10 units daily -Continue Metformin -Continue CBG monitoring -Recommend CGM -Follow up with Dr Raymondo Band next week -Would consider SGLT2 and restarting GLP-1, would be ideal if he could get off insulin given improper use although this may be ambitious

## 2022-11-18 NOTE — Progress Notes (Signed)
    SUBJECTIVE:   CHIEF COMPLAINT / HPI:   Hypoglycemia Patient having hypoglycemic episodes over the past week or so.  History of diabetes, current meds: insulin glargine 30u daily in the morning, Novolog 40u TID with meals, and Metformin 1000mg  BID.  Has had 6 episodes of hypoglycemia in the past 4 days alone CBG 57, 70, 34, 57, 48, 69  Has been taking insulin differently than prescribed. One day took 70 units of Novolog at a time when his sugar was high. Also taking Novolog even if he doesn't eat  Feels sweaty, confused, weak with these episodes.  Follows at the [place] as well. Sometimes gets conflicting information from [place] vs PCP office in terms of how much insulin to take.  Has [place], chocolate candies, and peanut butter crackers in the basket of his power scooter today  PERTINENT  PMH / PSH: HTN, PAD, COPD  OBJECTIVE:   BP 115/72   Pulse 75   Ht 5\' 10"  (1.778 m)   SpO2 100%   BMI 25.54 kg/m   General: NAD, sitting in power scooter Respiratory: No respiratory distress Skin: warm and dry, no rashes noted Psych: Normal affect and mood Neuro: no obvious focal deficits Ext: s/p R BKA   ASSESSMENT/PLAN:   Type 2 diabetes mellitus with neurologic complication, with long-term current use of insulin (HCC) A1c 6.6%. Having frequent and profound hypoglycemic episodes. Likely related to improper insulin use. -STOP novolog -Decrease glargine to 10 units daily -Continue Metformin -Continue CBG monitoring -Recommend CGM -Follow up with Dr Raymondo Band next week -Would consider SGLT2 and restarting GLP-1, would be ideal if he could get off insulin given improper use although this may be ambitious     Maury Dus, MD [facility]

## 2022-11-19 ENCOUNTER — Encounter: Payer: Self-pay | Admitting: Orthopedic Surgery

## 2022-11-19 NOTE — Progress Notes (Signed)
Office Visit Note   Patient: Adam Ray           Date of Birth: 1955-12-10           MRN: [id number] Visit Date: 11/05/2022              Requested by: Latrelle Dodrill, MD [address] [place],  [place] [address] PCP: Latrelle Dodrill, MD  Chief Complaint  Patient presents with   Left Foot - Routine Post Op    10/11/2022 left first ray amputation and 2nd toe amputation      HPI: Patient is a 67 year old gentleman who is status post left first ray and second toe amputation 1 month ago.  Patient states he is using elevation and compression and lymphedema pumps.  Assessment & Plan: Visit Diagnoses:  1. Peripheral vascular disease (HCC)   2. Lymphedema due to venous insufficiency   3. Idiopathic chronic venous hypertension of both lower extremities with inflammation     Plan: Patient has increased venous and lymphatic swelling.  Will start with Dynaflex wrap silver cell to the wounds and change twice a week until the swelling is better under control.  Follow-Up Instructions: Return in about 1 week (around 11/12/2022).   Ortho Exam  Examination the foot and leg have increased swelling with the wound dehiscing secondary to swelling.  Will need to better control swelling to prevent complete wound dehiscence.  Discussed the importance of elevation.   Imaging: No results found.   Labs: Lab Results  Component Value Date   HGBA1C 6.6 11/18/2022   HGBA1C 7.9 (A) 07/05/2022   HGBA1C 9.4 (H) 01/17/2022   ESRSEDRATE 54 (H) 01/01/2020   ESRSEDRATE 8 01/29/2016   ESRSEDRATE 9 04/11/2012   CRP 2.6 (H) 01/29/2016   CRP 0.5 02/28/2014   REPTSTATUS 01/18/2020 FINAL 01/17/2020   REPTSTATUS 01/22/2020 FINAL 01/17/2020   REPTSTATUS 01/22/2020 FINAL 01/17/2020   GRAMSTAIN  04/11/2012    NO WBC SEEN RARE SQUAMOUS EPITHELIAL CELLS PRESENT MODERATE GRAM POSITIVE COCCI IN PAIRS RARE GRAM NEGATIVE RODS   GRAMSTAIN  04/11/2012    NO WBC SEEN RARE SQUAMOUS EPITHELIAL  CELLS PRESENT MODERATE GRAM POSITIVE COCCI IN PAIRS RARE GRAM NEGATIVE RODS   CULT (A) 01/17/2020    <10,000 COLONIES/mL INSIGNIFICANT GROWTH Performed at [facility], [address], [place], [place] [address]    CULT  01/17/2020    NO GROWTH 5 DAYS Performed at [facility], [address], [place], [place] [address]    CULT  01/17/2020    NO GROWTH 5 DAYS Performed at [facility], [address], [place], [place] [address]    LABORGA NO GROWTH 2 DAYS 07/19/2014     Lab Results  Component Value Date   ALBUMIN 4.5 01/17/2022   ALBUMIN 4.1 12/06/2020   ALBUMIN 4.3 12/01/2020    No results found for: "MG" No results found for: "VD25OH"  No results found for: "PREALBUMIN"    Latest Ref Rng & Units 10/11/2022    8:09 AM 01/17/2022    3:14 PM 12/06/2020    4:42 PM  CBC EXTENDED  WBC 4.0 - 10.5 K/uL 3.1  3.8  5.5   RBC 4.22 - 5.81 MIL/uL 4.17  4.64  4.90   Hemoglobin 13.0 - 17.0 g/dL 65.7  84.6  96.2   HCT 39.0 - 52.0 % 36.6  39.6  39.8   Platelets 150 - 400 K/uL 117  104  138  There is no height or weight on file to calculate BMI.  Orders:  No orders of the defined types were placed in this encounter.  No orders of the defined types were placed in this encounter.    Procedures: No procedures performed  Clinical Data: No additional findings.  ROS:  All other systems negative, except as noted in the HPI. Review of Systems  Objective: Vital Signs: There were no vitals taken for this visit.  Specialty Comments:  No specialty comments available.  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   Atrophy of tongue papillae 10/02/2022   Atherosclerosis of coronary artery without angina pectoris 10/02/2022   Age-related  nuclear cataract of left eye 10/02/2022   Brown hairy tongue 10/02/2022   Proliferative diabetic retinopathy of right eye associated with type 2 diabetes mellitus (HCC) 10/02/2022   Tongue discoloration 02/14/2022   Hematuria 08/16/2021   Cervical spondylosis without myelopathy 12/14/2020   Pain in joint of left shoulder 12/14/2020   Urinary incontinence 08/10/2020   Increased urinary frequency 05/09/2020   Nocturnal leg cramps 10/28/2019   Hyperlipidemia associated with type 2 diabetes mellitus (HCC) 05/26/2019   Limited mobility 05/12/2018   Anxiety state 06/02/2017   Leg swelling 01/29/2017   Idiopathic chronic venous hypertension of both lower extremities with inflammation 09/24/2016   Diabetic polyneuropathy associated with type 2 diabetes mellitus (HCC)    Type 2 diabetes mellitus with neurologic complication, with long-term current use of insulin (HCC) 12/08/2015   Left shoulder pain 08/10/2015   Pulmonary nodule 02/01/2015   Depression 12/09/2013   Warts, genital 08/20/2013   Diabetic retinopathy (HCC) 01/28/2013   Sleep apnea 09/06/2010   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   Hypertension associated with diabetes (HCC) 11/09/2008   COPD, mild (HCC) 10/06/2006   SICKLE-CELL TRAIT 04/26/2006   ERECTILE DYSFUNCTION 04/26/2006   Tobacco use 04/26/2006   Peripheral arterial disease (HCC) 04/26/2006   Allergic rhinitis 04/26/2006   Past Medical History:  Diagnosis Date   Allergy    Arthritis    Bronchitis    Cataract    CHF (congestive heart failure) (HCC)    Chronic kidney disease    Claudication (HCC)    right foot ray resection   Colon polyps    hyperplastic   COPD (chronic obstructive pulmonary disease) (HCC)    Coronary artery disease    Diabetes mellitus    type II   Genital warts    Gout    Hyperlipidemia    Hypertension    Myocardial infarction (HCC)    Osteomyelitis of third toe of right foot (HCC)    Pneumonia    Status post amputation of toe of right  foot (HCC) 09/24/2016   Status post transmetatarsal amputation of foot, 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/2018   STEMI involving left circumflex coronary artery (HCC) 07/12/2018   Coronary artery disease   Subacute osteomyelitis, right ankle and foot (HCC) 01/29/2016   Testicular mass 04/18/2016    Family History  Problem Relation Age of Onset   Diabetes Mother    Stroke Mother    Heart failure Father    Heart attack Father    Diabetes Sister        multiple siblings   Diabetes Brother  muliple siblings   Heart disease Brother    Colon cancer Neg Hx    Esophageal cancer Neg Hx    Rectal cancer Neg Hx    Stomach cancer Neg Hx     Past Surgical History:  Procedure Laterality Date   AMPUTATION Right 01/31/2016   Procedure: Right 2nd Toe Amputation;  Surgeon: [name], MD;  Location: [facility] OR;  Service: Orthopedics;  Laterality: Right;   AMPUTATION Right 07/08/2018   Procedure: RIGHT TRANSMETATARSAL AMPUTATION;  Surgeon: [name], MD;  Location: [facility] OR;  Service: Orthopedics;  Laterality: Right;   AMPUTATION Right 01/05/2020   Procedure: RIGHT BELOW KNEE AMPUTATION;  Surgeon: [name], MD;  Location: [facility] OR;  Service: Orthopedics;  Laterality: Right;   AMPUTATION Right 02/23/2020   Procedure: RIGHT BELOW KNEE AMPUTATION REVISION;  Surgeon: [name], MD;  Location: [facility] OR;  Service: Orthopedics;  Laterality: Right;   AMPUTATION Left 10/11/2022   Procedure: LEFT GREAT TOE AMPUTATION AND LEFT 2ND TOE AMPUTATION;  Surgeon: [name], MD;  Location: [facility] OR;  Service: Orthopedics;  Laterality: Left;   CATARACT EXTRACTION     right eye   COLONOSCOPY     CORONARY STENT INTERVENTION N/A 07/12/2018   Procedure: CORONARY STENT INTERVENTION;  Surgeon: Lennette Bihari, MD;  Location: [facility];  Service: Cardiovascular;  Laterality: N/A;   CORONARY/GRAFT ACUTE MI REVASCULARIZATION N/A 07/12/2018   Procedure: Coronary/Graft Acute MI Revascularization;  Surgeon: Lennette Bihari, MD;   Location: [facility];  Service: Cardiovascular;  Laterality: N/A;   I & D EXTREMITY  04/11/2012   Procedure: IRRIGATION AND DEBRIDEMENT EXTREMITY;  Surgeon: Toni Arthurs, MD;  Location: [facility] OR;  Service: Orthopedics;  Laterality: Right;   LEFT HEART CATH AND CORONARY ANGIOGRAPHY N/A 07/12/2018   Procedure: LEFT HEART CATH AND CORONARY ANGIOGRAPHY;  Surgeon: Lennette Bihari, MD;  Location: [facility];  Service: Cardiovascular;  Laterality: N/A;   Surgery left great toe     Tear ducts bilateral eyes     TRANSMETATARSAL AMPUTATION Right 07/08/2018   Social History   Occupational History   Occupation: disabled  Tobacco Use   Smoking status: Former    Packs/day: 0.30    Years: 48.00    Additional pack years: 0.00    Total pack years: 14.40    Types: Cigars, Cigarettes    Start date: 07/02/1963    Quit date: 06/01/2019    Years since quitting: 3.4   Smokeless tobacco: Former  Building services engineer Use: Former  Substance and Sexual Activity   Alcohol use: Yes    Alcohol/week: 0.0 standard drinks of alcohol   Drug use: No   Sexual activity: Not on file

## 2022-11-21 ENCOUNTER — Encounter: Payer: Self-pay | Admitting: Orthopedic Surgery

## 2022-11-21 ENCOUNTER — Ambulatory Visit: Payer: No Typology Code available for payment source | Admitting: Orthopedic Surgery

## 2022-11-21 DIAGNOSIS — S98112A Complete traumatic amputation of left great toe, initial encounter: Secondary | ICD-10-CM

## 2022-11-21 DIAGNOSIS — I739 Peripheral vascular disease, unspecified: Secondary | ICD-10-CM

## 2022-11-21 DIAGNOSIS — Z89412 Acquired absence of left great toe: Secondary | ICD-10-CM

## 2022-11-21 DIAGNOSIS — I890 Lymphedema, not elsewhere classified: Secondary | ICD-10-CM

## 2022-11-21 DIAGNOSIS — I872 Venous insufficiency (chronic) (peripheral): Secondary | ICD-10-CM

## 2022-11-21 NOTE — Progress Notes (Signed)
Office Visit Note   Patient: Adam Ray           Date of Birth: 1955/09/26           MRN: [id number] Visit Date: 11/21/2022              Requested by: Latrelle Dodrill, MD [address] [place],  [place] [address] PCP: Latrelle Dodrill, MD  Chief Complaint  Patient presents with   Left Foot - Routine Post Op    10/11/2022 left GT and second toe amputation       HPI: Patient is a 67 year old gentleman who presents in follow-up status post amputation of the great toe and second toe left foot.  Patient has had increased swelling in the left lower extremity.  Patient has been using Ace wraps for compression.  Patient originally had a Dynaflex compression wrap however this was not reapplied secondary to the last compression wrap being saturated with urine.  Assessment & Plan: Visit Diagnoses:  1. Peripheral vascular disease (HCC)   2. Lymphedema due to venous insufficiency   3. 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  Discussed that with persistent swelling patient is at risk of ulceration and risk of limb loss.  Follow-Up Instructions: No follow-ups on file.   Ortho Exam  Patient is alert, oriented, no adenopathy, well-dressed, normal affect, normal respiratory effort. Examination the wound bed has healthy granulation tissue and has interval healing.  There is a area of granulation tissue that is 1 cm in diameter has no tunneling no cellulitis no drainage.  Patient has increased pitting edema in the left leg with a 45 cm in circumference.  There are no open ulcers or drainage.  Imaging: No results found. No images are attached to the encounter.  Labs: Lab Results  Component Value Date   HGBA1C 6.6 11/18/2022   HGBA1C 7.9 (A) 07/05/2022   HGBA1C 9.4 (H) 01/17/2022   ESRSEDRATE 54 (H) 01/01/2020    ESRSEDRATE 8 01/29/2016   ESRSEDRATE 9 04/11/2012   CRP 2.6 (H) 01/29/2016   CRP 0.5 02/28/2014   REPTSTATUS 01/18/2020 FINAL 01/17/2020   REPTSTATUS 01/22/2020 FINAL 01/17/2020   REPTSTATUS 01/22/2020 FINAL 01/17/2020   GRAMSTAIN  04/11/2012    NO WBC SEEN RARE SQUAMOUS EPITHELIAL CELLS PRESENT MODERATE GRAM POSITIVE COCCI IN PAIRS RARE GRAM NEGATIVE RODS   GRAMSTAIN  04/11/2012    NO WBC SEEN RARE SQUAMOUS EPITHELIAL CELLS PRESENT MODERATE GRAM POSITIVE COCCI IN PAIRS RARE GRAM NEGATIVE RODS   CULT (A) 01/17/2020    <10,000 COLONIES/mL INSIGNIFICANT GROWTH Performed at [facility], [address] [place], [place] [address]    CULT  01/17/2020    NO GROWTH 5 DAYS Performed at [facility], [address], [place], [place] [address]    CULT  01/17/2020    NO GROWTH 5 DAYS Performed at [facility], [address] [place], [place] [address]    LABORGA NO GROWTH 2 DAYS 07/19/2014     Lab Results  Component Value Date   ALBUMIN 4.5 01/17/2022   ALBUMIN 4.1 12/06/2020   ALBUMIN 4.3 12/01/2020    No results found for: "MG" No results found for: "VD25OH"  No results found for: "PREALBUMIN"    Latest Ref Rng & Units 10/11/2022  8:09 AM 01/17/2022    3:14 PM 12/06/2020    4:42 PM  CBC EXTENDED  WBC 4.0 - 10.5 K/uL 3.1  3.8  5.5   RBC 4.22 - 5.81 MIL/uL 4.17  4.64  4.90   Hemoglobin 13.0 - 17.0 g/dL 16.1  09.6  04.5   HCT 39.0 - 52.0 % 36.6  39.6  39.8   Platelets 150 - 400 K/uL 117  104  138      There is no height or weight on file to calculate BMI.  Orders:  No orders of the defined types were placed in this encounter.  No orders of the defined types were placed in this encounter.    Procedures: No procedures performed  Clinical Data: No additional findings.  ROS:  All other systems negative, except as noted in the HPI. Review of Systems  Objective: Vital Signs: There were no vitals taken for this visit.  Specialty Comments:   No specialty comments available.  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   Atrophy of tongue papillae 10/02/2022   Atherosclerosis of coronary artery without angina pectoris 10/02/2022   Age-related nuclear cataract of left eye 10/02/2022   Brown hairy tongue 10/02/2022   Proliferative diabetic retinopathy of right eye associated with type 2 diabetes mellitus (HCC) 10/02/2022   Tongue discoloration 02/14/2022   Hematuria 08/16/2021   Cervical spondylosis without myelopathy 12/14/2020   Pain in joint of left shoulder 12/14/2020   Urinary incontinence 08/10/2020   Increased urinary frequency 05/09/2020   Nocturnal leg cramps 10/28/2019   Hyperlipidemia associated with type 2 diabetes mellitus (HCC) 05/26/2019   Limited mobility 05/12/2018   Anxiety state 06/02/2017   Leg swelling 01/29/2017   Idiopathic chronic venous hypertension of both lower extremities with inflammation 09/24/2016   Diabetic polyneuropathy associated with type 2 diabetes mellitus (HCC)    Type 2 diabetes mellitus with neurologic complication, with long-term current use of insulin (HCC) 12/08/2015   Left shoulder pain 08/10/2015   Pulmonary nodule 02/01/2015   Depression 12/09/2013   Warts, genital 08/20/2013   Diabetic retinopathy (HCC) 01/28/2013   Sleep apnea 09/06/2010   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   Hypertension associated with diabetes (HCC) 11/09/2008   COPD, mild (HCC) 10/06/2006   SICKLE-CELL TRAIT 04/26/2006   ERECTILE DYSFUNCTION 04/26/2006   Tobacco use 04/26/2006   Peripheral arterial disease (HCC) 04/26/2006   Allergic rhinitis 04/26/2006   Past Medical History:  Diagnosis Date   Allergy    Arthritis    Bronchitis    Cataract    CHF (congestive heart failure)  (HCC)    Chronic kidney disease    Claudication (HCC)    right foot ray resection   Colon polyps    hyperplastic   COPD (chronic obstructive pulmonary disease) (HCC)    Coronary artery disease    Diabetes mellitus    type II   Genital warts    Gout    Hyperlipidemia    Hypertension    Myocardial infarction (HCC)    Osteomyelitis of third toe of right foot (HCC)    Pneumonia    Status post amputation of toe of right foot (HCC) 09/24/2016   Status post transmetatarsal amputation of foot, 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/2018   STEMI involving left circumflex  coronary artery (HCC) 07/12/2018   Coronary artery disease   Subacute osteomyelitis, right ankle and foot (HCC) 01/29/2016   Testicular mass 04/18/2016    Family History  Problem Relation Age of Onset   Diabetes Mother    Stroke Mother    Heart failure Father    Heart attack Father    Diabetes Sister        multiple siblings   Diabetes Brother        muliple siblings   Heart disease Brother    Colon cancer Neg Hx    Esophageal cancer Neg Hx    Rectal cancer Neg Hx    Stomach cancer Neg Hx     Past Surgical History:  Procedure Laterality Date   AMPUTATION Right 01/31/2016   Procedure: Right 2nd Toe Amputation;  Surgeon: [name], MD;  Location: [facility] OR;  Service: Orthopedics;  Laterality: Right;   AMPUTATION Right 07/08/2018   Procedure: RIGHT TRANSMETATARSAL AMPUTATION;  Surgeon: [name], MD;  Location: [facility] OR;  Service: Orthopedics;  Laterality: Right;   AMPUTATION Right 01/05/2020   Procedure: RIGHT BELOW KNEE AMPUTATION;  Surgeon: [name], MD;  Location: [facility] OR;  Service: Orthopedics;  Laterality: Right;   AMPUTATION Right 02/23/2020   Procedure: RIGHT BELOW KNEE AMPUTATION REVISION;  Surgeon: [name], MD;  Location: [facility] OR;  Service: Orthopedics;  Laterality: Right;   AMPUTATION Left 10/11/2022   Procedure: LEFT GREAT TOE AMPUTATION AND LEFT 2ND TOE AMPUTATION;  Surgeon: [name], MD;  Location: [facility] OR;   Service: Orthopedics;  Laterality: Left;   CATARACT EXTRACTION     right eye   COLONOSCOPY     CORONARY STENT INTERVENTION N/A 07/12/2018   Procedure: CORONARY STENT INTERVENTION;  Surgeon: Lennette Bihari, MD;  [facility];  Service: Cardiovascular;  Laterality: N/A;   CORONARY/GRAFT ACUTE MI REVASCULARIZATION N/A 07/12/2018   Procedure: Coronary/Graft Acute MI Revascularization;  Surgeon: Lennette Bihari, MD;  [facility];  Service: Cardiovascular;  Laterality: N/A;   I & D EXTREMITY  04/11/2012   Procedure: IRRIGATION AND DEBRIDEMENT EXTREMITY;  Surgeon: Toni Arthurs, MD;  Location: [facility] OR;  Service: Orthopedics;  Laterality: Right;   LEFT HEART CATH AND CORONARY ANGIOGRAPHY N/A 07/12/2018   Procedure: LEFT HEART CATH AND CORONARY ANGIOGRAPHY;  Surgeon: Lennette Bihari, MD;  Location: [facility];  Service: Cardiovascular;  Laterality: N/A;   Surgery left great toe     Tear ducts bilateral eyes     TRANSMETATARSAL AMPUTATION Right 07/08/2018   Social History   Occupational History   Occupation: disabled  Tobacco Use   Smoking status: Former    Packs/day: 0.30    Years: 48.00    Additional pack years: 0.00    Total pack years: 14.40    Types: Cigars, Cigarettes    Start date: 07/02/1963    Quit date: 06/01/2019    Years since quitting: 3.4   Smokeless tobacco: Former  Building services engineer Use: Former  Substance and Sexual Activity   Alcohol use: Yes    Alcohol/week: 0.0 standard drinks of alcohol   Drug use: No   Sexual activity: Not on file

## 2022-11-22 ENCOUNTER — Other Ambulatory Visit: Payer: Self-pay | Admitting: Orthopedic Surgery

## 2022-11-26 ENCOUNTER — Ambulatory Visit (INDEPENDENT_AMBULATORY_CARE_PROVIDER_SITE_OTHER): Payer: 59 | Admitting: Family Medicine

## 2022-11-26 ENCOUNTER — Encounter: Payer: Self-pay | Admitting: Family Medicine

## 2022-11-26 ENCOUNTER — Ambulatory Visit (INDEPENDENT_AMBULATORY_CARE_PROVIDER_SITE_OTHER): Payer: 59 | Admitting: Pharmacist

## 2022-11-26 ENCOUNTER — Other Ambulatory Visit: Payer: Self-pay

## 2022-11-26 ENCOUNTER — Other Ambulatory Visit (HOSPITAL_COMMUNITY): Payer: Self-pay

## 2022-11-26 ENCOUNTER — Encounter: Payer: Self-pay | Admitting: Pharmacist

## 2022-11-26 VITALS — BP 96/64 | HR 89

## 2022-11-26 DIAGNOSIS — Z794 Long term (current) use of insulin: Secondary | ICD-10-CM

## 2022-11-26 DIAGNOSIS — E1141 Type 2 diabetes mellitus with diabetic mononeuropathy: Secondary | ICD-10-CM | POA: Diagnosis not present

## 2022-11-26 DIAGNOSIS — E1165 Type 2 diabetes mellitus with hyperglycemia: Secondary | ICD-10-CM

## 2022-11-26 DIAGNOSIS — E1142 Type 2 diabetes mellitus with diabetic polyneuropathy: Secondary | ICD-10-CM

## 2022-11-26 DIAGNOSIS — E11319 Type 2 diabetes mellitus with unspecified diabetic retinopathy without macular edema: Secondary | ICD-10-CM | POA: Diagnosis not present

## 2022-11-26 MED ORDER — FREESTYLE LIBRE 3 READER DEVI
1.0000 | 0 refills | Status: DC
Start: 2022-11-26 — End: 2023-09-02

## 2022-11-26 MED ORDER — FREESTYLE LIBRE 3 SENSOR MISC
1.0000 | 2 refills | Status: DC
Start: 2022-11-26 — End: 2023-01-21

## 2022-11-26 MED ORDER — LANTUS SOLOSTAR 100 UNIT/ML ~~LOC~~ SOPN
30.0000 [IU] | PEN_INJECTOR | Freq: Every day | SUBCUTANEOUS | 3 refills | Status: DC
Start: 2022-11-26 — End: 2023-01-06

## 2022-11-26 NOTE — Assessment & Plan Note (Signed)
Diabetes longstanding currently uncontrolled with home BG readings >200-400s with hypoglycemia episodes when meals are skipped. Medication adherence appears variable with insulin. Control is suboptimal due to varying insulin administration and diet.  Left foot continues to heal from amputation. -Increased dose of basal insulin Lantus (insulin glargine) from 10 units to 30 units daily in the morning.  (Previously has taken 30 units BID). -Continued to hold rapid insulin Novolog (insulin aspart). -Continued GLP-1 Trulicity (dulaglutide) 1.5 mg weekly.  -Continued metformin 1000 mg BID.  -Sent prescriptions for CGM Freestyle Libre 3 sensors and reader to pharmacy. Instructed to pick up and return to office with them at follow-up visit for setup.  -Patient educated on purpose, proper use, and potential adverse effects of medications.  Encouraged to avoid missing  -Extensively discussed pathophysiology of diabetes, recommended lifestyle interventions, dietary effects on blood sugar control.  -Counseled on s/sx of and management of hypoglycemia.  -Next A1c anticipated July 2024.

## 2022-11-26 NOTE — Patient Instructions (Addendum)
It was wonderful to see you today.  Please bring ALL of your medications with you to every visit.   Today we talked about:  You are overdue for a diabetic eye examination. Their office will call you for an appointment.  It seems that your sugars are improving as you are having less lows.   Thank you for coming to your visit as scheduled. We have had a large "no-show" problem lately, and this significantly limits our ability to see and care for patients. As a friendly reminder- if you cannot make your appointment please call to cancel. We do have a no show policy for those who do not cancel within 24 hours. Our policy is that if you miss or fail to cancel an appointment within 24 hours, 3 times in a 60-month period, you may be dismissed from our clinic.   Thank you for choosing [facility].   Please call [phone] with any questions about today's appointment.  Please be sure to schedule follow up at the front  desk before you leave today.   Sabino Dick, DO PGY-3 Family Medicine

## 2022-11-26 NOTE — Progress Notes (Signed)
S:     Chief Complaint  Patient presents with   Medication Management    Diabetes   67 y.o. male who presents for diabetes evaluation, education, and management.  PMH is significant for diabetes,.  Also seen by Dr. Melba Coon today. Last seen by Dr. Anner Crete on 11/18/2022, where Novolog was discontinued after episodes of severe hypoglycemia due to taking insulin differently than prescribed.  Today, patient arrives in good spirits, though somnolent, and in an electric wheelchair, left foot bandaged. Accompanied by wife, Marylene Land.   Current diabetes medications include: insulin glargine (Lantus) 10 units daily, dulaglutide 1.5 mg weekly (Mondays), metformin 1000 mg BID Previously taking 40 units of Novolog multiple times daily Current hypertension medications include: metoprolol 25 mg daily Current hyperlipidemia medications include: rosuvastatin 20 mg daily  Patient reports adherence to taking all medications as prescribed.   Do you feel that your medications are working for you? yes Have you been experiencing any side effects to the medications prescribed? no Do you have any problems obtaining medications due to transportation or finances? no Insurance coverage: [employer], [employer]  Patient reports hypoglycemic events. Wife called 911 a few weeks ago d/t hypoglycemia event. Wife reports they gave him something to eat and drink. Wife reports patient was awake, but shaking and sweating. EMS took BG - 29 mg/dL  Patient reports visual changes. Increased blurred vision over the last few days.  Patient reported dietary habits: Eats 3 meals/day Snacks: grapes, peanut butter crackers  Within the past 12 months, did you worry whether your food would run out before you got money to buy more? no Within the past 12 months, did the food you bought run out, and you didn't have money to get more? no  O:   Review of Systems  Eyes:  Positive for blurred vision.  All other systems reviewed and  are negative.   Physical Exam Pulmonary:     Effort: Pulmonary effort is normal.     Patient brought home glucose monitor:  Most readings in the 200s-300s; a few high readings. Some hypoglycemia events - 40s BG 206 today   Lab Results  Component Value Date   HGBA1C 6.6 11/18/2022    Lipid Panel     Component Value Date/Time   CHOL 97 (L) 01/17/2022 1514   TRIG 299 (H) 01/17/2022 1514   HDL 40 01/17/2022 1514   CHOLHDL 2.4 01/17/2022 1514   CHOLHDL 2.4 10/18/2018 1612   VLDL 44 (H) 10/18/2018 1612   LDLCALC 14 01/17/2022 1514   LDLDIRECT 78 09/11/2011 1503    Clinical Atherosclerotic Cardiovascular Disease (ASCVD): Yes  The ASCVD Risk score (Arnett DK, et al., 2019) failed to calculate for the following reasons:   The patient has a prior MI or stroke diagnosis   A/P: Diabetes longstanding currently uncontrolled with home BG readings >200-400s with hypoglycemia episodes when meals are skipped. Medication adherence appears variable with insulin. Control is suboptimal due to varying insulin administration and diet.  Left foot continues to heal from amputation. -Increased dose of basal insulin Lantus (insulin glargine) from 10 units to 30 units daily in the morning.  (Previously has taken 30 units BID).  Switched from vials to pens.  - Continued to hold  rapid insulin Novolog (insulin aspart). -Continued GLP-1 Trulicity (dulaglutide) 1.5 mg weekly.  -Continued metformin 1000 mg BID.  -Sent prescriptions for CGM Freestyle Libre 3 sensors and reader to pharmacy. Instructed to pick up and return to office with them at follow-up  visit for setup.  -Patient educated on purpose, proper use, and potential adverse effects of medications.  Encouraged to avoid missing  -Extensively discussed pathophysiology of diabetes, recommended lifestyle interventions, dietary effects on blood sugar control.  -Counseled on s/sx of and management of hypoglycemia.  -Next A1c anticipated July 2024.    ASCVD risk - secondary prevention in patient with diabetes. Last LDL is 14 at goal of <70  mg/dL.  -Continued rosuvastatin 20 mg.   Hypertension currently controlled. Blood pressure goal of <130/80 mmHg. Medication adherence good.  -Continued metoprolol 25 mg BID.  Written patient instructions provided. Patient verbalized understanding of treatment plan.  Total time in face to face counseling 27 minutes.    Follow-up:  Pharmacist 12/02/2022 for CGM set up. Patient seen with Haze Boyden PharmD Candidate.

## 2022-11-26 NOTE — Patient Instructions (Addendum)
It was nice to see you today!  Your goal blood sugar is 80-130 before eating and less than 180 after eating.  Medication Changes: Increase Lantus insulin glargine to 30 units daily.  Do not take Novolog.  Pick up Continuous Glucose Meter sensors and reader from the pharmacy. Bring them to the next appointment on Monday June 3rd.  Monitor blood sugars at home and keep a log (glucometer or piece of paper) to bring with you to your next visit.  Keep up the good work with diet and exercise. Aim for a diet full of vegetables, fruit and lean meats (chicken, [place], fish). Try to limit salt intake by eating fresh or frozen vegetables (instead of canned), rinse canned vegetables prior to cooking and do not add any additional salt to meals.

## 2022-11-26 NOTE — Progress Notes (Signed)
    SUBJECTIVE:   CHIEF COMPLAINT / HPI:   Adam Ray is a 67 y.o. male who presents to the [facility] clinic today to discuss the following concerns:   Eye Concern Reports both of his eyes are a little blurry. Has been this way for a while. He has hx of diabetic retinopathy and is overdue for an ophthalmologic examination.  Wife seemed frustrated as she thought he was going to be receiving his diabetic eye exam today.  Type 2 Diabetes  Was last seen in our office on 5/20.  Was having several hypoglycemic episodes at that time, thought to be attributed to improper insulin use (taking more than prescribed and taking novolog even if he did not eat).  He was advised to stop NovoLog and to decrease his glargine to 10 units daily and continue Metformin. His A1c is at goal of 6.6%.   States about an hour ago his sugar was 44. Prior to this, he did not have any low sugar readings since last office visit. He has stopped his Novolog. He is only taking 10U of Lantus as prescribed. He is still taking his Metformin.  He is also meeting with Dr. Raymondo Band today at 230PM.    Not on anti-hypertensives.   PERTINENT  PMH / PSH: Hypertension with diabetes, COPD, PAD, HLD   OBJECTIVE:   BP 96/64   Pulse 89   SpO2 97%    General: Elderly, chronically ill-appearing, sitting in power scooter Cardiac: RRR Respiratory: CTAB, normal effort, No wheezes, rales or rhonchi Psych: Normal affect and mood  ASSESSMENT/PLAN:   1. Diabetic retinopathy associated with type 2 diabetes mellitus, macular edema presence unspecified, unspecified laterality, unspecified retinopathy severity (HCC) Apologized for the misunderstanding. Discussed that he has had abnormal eye exams in the past and requires at least annual follow-up with ophthalmology. Will send another referrral.  - Ambulatory referral to Ophthalmology  2. Type 2 diabetes mellitus with hyperglycemia, with long-term current use of insulin (HCC) Has only had 1  hypoglycemic episode since last visit.  Reviewed glucometer and has had several readings above 200.  Completely off NovoLog at this time. Met with Dr. Raymondo Band and plan was to increase to 30U of Lantus. Has close f/u with Dr. Raymondo Band.     Sabino Dick, DO [facility]

## 2022-11-29 NOTE — Progress Notes (Signed)
Reviewed and agree with Dr Koval's plan.   

## 2022-12-02 ENCOUNTER — Encounter: Payer: Self-pay | Admitting: Pharmacist

## 2022-12-02 ENCOUNTER — Ambulatory Visit (INDEPENDENT_AMBULATORY_CARE_PROVIDER_SITE_OTHER): Payer: 59 | Admitting: Pharmacist

## 2022-12-02 VITALS — BP 105/60 | HR 78

## 2022-12-02 DIAGNOSIS — E1142 Type 2 diabetes mellitus with diabetic polyneuropathy: Secondary | ICD-10-CM

## 2022-12-02 NOTE — Assessment & Plan Note (Signed)
Diabetes longstanding currently controlled based on most recent A1c (6.6% on 11/18/22). Medication adherence appears appropriate. Control is suboptimal due to confusion with medication regimen. Will plan to slowly titrate back up on insulin regimen once CGM data available.  Patient denies any symptoms of hypoglycemia since last visit.  -Continued basal insulin Lantus(insulin glargine) 30 units daily in the morning.  -Continued GLP-1 Trulicity (dulaglutide) 1.5 mg once weekly -Continued metformin 500 mg BID.

## 2022-12-02 NOTE — Progress Notes (Signed)
S:     Chief Complaint  Patient presents with   Medication Management    DM + CGM placement   67 y.o. male who presents for diabetes evaluation, education, and management.  PMH is significant for T2DM, COPD, and depression.  Patient was referred and last seen by Primary Care Provider, Dr. Anner Crete, on 11/18/2022.   At last visit with the pharmacy team, Lantus (insulin glargine) was increased from 10 to 30 units. Previously decreased due to incorrect Novolog (insulin aspart) dosing leading to hypoglycemia.    Today, patient arrives in good spirits and presents with assistance of a wheelchair, left foot bandaged. He  Current diabetes medications include: insulin glargine (Lantus) 30 units daily, dulaglutide (Trulicity) 1.5 mg weekly (Mondays), metformin 1000 mg BID Current hypertension medications include: metoprolol 25 mg daily Current hyperlipidemia medications include: rosuvastatin 20 mg daily  Patient reports adherence to taking all medications as prescribed.   Insurance coverage: [employer], [employer]  Patient reports visual changes. Increased blurred vision over the last few days.   Patient denies hypoglycemic events.   O:   Review of Systems  Eyes:  Positive for blurred vision.  All other systems reviewed and are negative.   Physical Exam Constitutional:      Appearance: Normal appearance.  Neurological:     Mental Status: He is alert.  Psychiatric:        Mood and Affect: Mood normal.        Behavior: Behavior normal.        Thought Content: Thought content normal.        Judgment: Judgment normal.     Lab Results  Component Value Date   HGBA1C 6.6 11/18/2022   Vitals:   12/02/22 1122  BP: 105/60  Pulse: 78  SpO2: 100%    Lipid Panel     Component Value Date/Time   CHOL 97 (L) 01/17/2022 1514   TRIG 299 (H) 01/17/2022 1514   HDL 40 01/17/2022 1514   CHOLHDL 2.4 01/17/2022 1514   CHOLHDL 2.4 10/18/2018 1612   VLDL 44 (H) 10/18/2018 1612    LDLCALC 14 01/17/2022 1514   LDLDIRECT 78 09/11/2011 1503    Clinical Atherosclerotic Cardiovascular Disease (ASCVD): Yes  The ASCVD Risk score (Arnett DK, et al., 2019) failed to calculate for the following reasons:   The patient has a prior MI or stroke diagnosis   A/P: Diabetes longstanding currently controlled based on most recent A1c (6.6% on 11/18/22). Medication adherence appears appropriate. Control is suboptimal due to confusion with medication regimen. Will plan to slowly titrate back up on insulin regimen once CGM data available.  Patient denies any symptoms of hypoglycemia since last visit.  -Continued basal insulin Lantus(insulin glargine) 30 units daily in the morning.  -Continued GLP-1 Trulicity (dulaglutide) 1.5 mg once weekly -Continued metformin 500 mg BID.  - Placed Freestyle Libre 3 CGM. Will return in 2 weeks for follow up and to place next sensor -Next A1c anticipated 01/2023.   ASCVD risk - secondary prevention in patient with diabetes. Last LDL is 14 at goal of <70  mg/dL.  -Continued rosuvastatin 20 mg.    Hypertension currently controlled. Blood pressure goal of <130/80 mmHg. Medication adherence good.  -Continued metoprolol tartrate 25 mg BID.  Written patient instructions provided. Patient verbalized understanding of treatment plan.  Total time in face to face counseling 22 minutes.    Follow-up:  Pharmacist 12/16/2022. PCP clinic visit on 12/16/22.  Patient seen with Lily Peer, PharmD,  PGY-1 resident.

## 2022-12-02 NOTE — Patient Instructions (Signed)
   Sensor Application If using the App, you can tap Help in the Main Menu to access an in-app tutorial on applying a Sensor. See below for instructions on how to download the app. Apply Sensors only on the back of your upper arm. If placed in other areas, the Sensor may not function properly and could give you inaccurate readings. Avoid areas with scars, moles, stretch marks, or lumps.   Select an area of skin that generally stays flat during your normal daily activities (no bending or folding). Choose a site that is at least 1 inch (2.5 cm) away from any injection sites. To prevent discomfort or skin irritation, you should select a different site other than the one most recently used. Wash application site using a plain soap, dry, and then clean with an alcohol wipe. This will help remove any oily residue that may prevent the sensor from sticking properly. Allow site to air dry before proceeding. Note: The area MUST be clean and dry, or the Sensor may not stay on for the full wear duration specified by your Sensor insert. 4. Unscrew the cap from the Sensor Applicator and set the cap aside.  5. Place the Sensor Applicator over the prepared site and push down firmly to apply the Sensor to your body. 6. Gently pull the Sensor Applicator away from your body. The Sensor should now be attached to your skin. 7. Make sure the Sensor is secure after application. Put the cap back on the Sensor Applicator. Discard the used Sensor Applicator according to local regulations.  What If My Sensor Falls Off or What If My Sensor Isn't Working? Call [employer] Customer Care Team at [phone] Available 7 days a week from 8AM-8PM EST, excluding holidays If yo have multiple sensors fall off prior to 14 days of use, contact  Family Medicine at [phone]   The App Download the FreeStyle Libre 3 App in your phone's app store   Load the app and select get started now Create an account  Tap scan new  sensor Follow the prompts on the screen. If your sensor does not sync, try moving your phone slowly around the sensor. Phone cases may affect scanning. This will be the only time you have to scan the sensor until you apply a new sensor in 14 days.  There will be a 60 minute start up period until the app will display your glucose reading   How To Share Your Readings With Us Once in the app, go to settings -> connected apps -> LibreView -> Enter Practice ID -> [id number]  

## 2022-12-03 NOTE — Progress Notes (Signed)
Reviewed and agree with Dr Koval's plan.   

## 2022-12-05 ENCOUNTER — Encounter: Payer: No Typology Code available for payment source | Admitting: Orthopedic Surgery

## 2022-12-09 ENCOUNTER — Telehealth: Payer: Self-pay | Admitting: Orthopedic Surgery

## 2022-12-09 NOTE — Telephone Encounter (Signed)
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 He must contact the DMV and they will set up a rad test for him to take so that this can be udated on his license. He states that he is having ahand controls put on his car and advised that this may also require a road test and that he should contact the DMV to find out what the policy is. Voiced understanding and will call with any other questions.

## 2022-12-09 NOTE — Telephone Encounter (Signed)
Adam Ray would like to know if he is able to start driving again.  He is s/p GT and 2nd toe amputation on the left 10/11/22.  His call back # is [phone].

## 2022-12-12 ENCOUNTER — Encounter: Payer: Self-pay | Admitting: Orthopedic Surgery

## 2022-12-12 ENCOUNTER — Ambulatory Visit (INDEPENDENT_AMBULATORY_CARE_PROVIDER_SITE_OTHER): Payer: 59 | Admitting: Orthopedic Surgery

## 2022-12-12 DIAGNOSIS — S98112A Complete traumatic amputation of left great toe, initial encounter: Secondary | ICD-10-CM

## 2022-12-12 DIAGNOSIS — Z89511 Acquired absence of right leg below knee: Secondary | ICD-10-CM | POA: Diagnosis not present

## 2022-12-12 DIAGNOSIS — I872 Venous insufficiency (chronic) (peripheral): Secondary | ICD-10-CM

## 2022-12-12 DIAGNOSIS — I890 Lymphedema, not elsewhere classified: Secondary | ICD-10-CM | POA: Diagnosis not present

## 2022-12-12 DIAGNOSIS — I739 Peripheral vascular disease, unspecified: Secondary | ICD-10-CM | POA: Diagnosis not present

## 2022-12-12 NOTE — Progress Notes (Signed)
Office Visit Note   Patient: Adam Ray           Date of Birth: March 09, 1956           MRN: [id number] Visit Date: 12/12/2022              Requested by: Latrelle Dodrill, MD [address] [place],  [place] [address] PCP: Latrelle Dodrill, MD  Chief Complaint  Patient presents with   Left Foot - Routine Post Op     10/11/2022 left GT and second toe amputation         HPI: Patient is a 67 year old gentleman who was seen in follow-up for right transtibial amputation and left foot first ray amputation patient has been having increasing swelling and ulceration over the residual limb.  Assessment & Plan: Visit Diagnoses:  1. Peripheral vascular disease (HCC)   2. Lymphedema due to venous insufficiency   3. 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 Acquired absence of right leg below knee (HCC)     Plan: Ulcer was debrided on the left foot.  Recommended wearing his compression socks recommended using the lymphedema pumps recommended elevation.  Discussed that once the swelling and ulcers have healed he could resume activities such as driving or mowing.  Follow-Up Instructions: Return in about 4 weeks (around 01/09/2023).   Ortho Exam  Patient is alert, oriented, no adenopathy, well-dressed, normal affect, normal respiratory effort. Examination patient has pitting edema of the left lower extremity there are no ulcers in the leg.  Patient has a large ulcer over the medial column of the left foot.  After informed consent a 10 blade knife was used to debride the skin and soft tissue back to healthy viable tissue.  The ulcer measures 6 x 1 cm and is 1 mm deep.  Imaging: No results found. No images are attached to the encounter.  Labs: Lab Results  Component Value Date   HGBA1C 6.6 11/18/2022   HGBA1C 7.9 (A) 07/05/2022   HGBA1C 9.4 (H) 01/17/2022   ESRSEDRATE 54 (H) 01/01/2020   ESRSEDRATE 8 01/29/2016   ESRSEDRATE 9 04/11/2012   CRP 2.6 (H) 01/29/2016    CRP 0.5 02/28/2014   REPTSTATUS 01/18/2020 FINAL 01/17/2020   REPTSTATUS 01/22/2020 FINAL 01/17/2020   REPTSTATUS 01/22/2020 FINAL 01/17/2020   GRAMSTAIN  04/11/2012    NO WBC SEEN RARE SQUAMOUS EPITHELIAL CELLS PRESENT MODERATE GRAM POSITIVE COCCI IN PAIRS RARE GRAM NEGATIVE RODS   GRAMSTAIN  04/11/2012    NO WBC SEEN RARE SQUAMOUS EPITHELIAL CELLS PRESENT MODERATE GRAM POSITIVE COCCI IN PAIRS RARE GRAM NEGATIVE RODS   CULT (A) 01/17/2020    <10,000 COLONIES/mL INSIGNIFICANT GROWTH Performed at [facility], [address] [place], [place] [address]    CULT  01/17/2020    NO GROWTH 5 DAYS Performed at [facility], [address] [place], [place] [address]    CULT  01/17/2020    NO GROWTH 5 DAYS Performed at [facility], [address] [place], [place] [address]    LABORGA NO GROWTH 2 DAYS 07/19/2014     Lab Results  Component Value Date   ALBUMIN 4.5 01/17/2022   ALBUMIN 4.1 12/06/2020   ALBUMIN 4.3 12/01/2020    No results found for: "MG" No results found for: "VD25OH"  No results found for: "PREALBUMIN"    Latest Ref Rng & Units 10/11/2022    8:09 AM 01/17/2022    3:14  PM 12/06/2020    4:42 PM  CBC EXTENDED  WBC 4.0 - 10.5 K/uL 3.1  3.8  5.5   RBC 4.22 - 5.81 MIL/uL 4.17  4.64  4.90   Hemoglobin 13.0 - 17.0 g/dL 16.1  09.6  04.5   HCT 39.0 - 52.0 % 36.6  39.6  39.8   Platelets 150 - 400 K/uL 117  104  138      There is no height or weight on file to calculate BMI.  Orders:  No orders of the defined types were placed in this encounter.  No orders of the defined types were placed in this encounter.    Procedures: No procedures performed  Clinical Data: No additional findings.  ROS:  All other systems negative, except as noted in the HPI. Review of Systems  Objective: Vital Signs: There were no vitals taken for this visit.  Specialty Comments:  No specialty comments available.  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   Atrophy of tongue papillae 10/02/2022   Atherosclerosis of coronary artery without angina pectoris 10/02/2022   Age-related nuclear cataract of left eye 10/02/2022   Brown hairy tongue 10/02/2022   Proliferative diabetic retinopathy of right eye associated with type 2 diabetes mellitus (HCC) 10/02/2022   Tongue discoloration 02/14/2022   Hematuria 08/16/2021   Cervical spondylosis without myelopathy 12/14/2020   Pain in joint of left shoulder 12/14/2020   Urinary incontinence 08/10/2020   Increased urinary frequency 05/09/2020   Nocturnal leg cramps 10/28/2019   Hyperlipidemia associated with type 2 diabetes mellitus (HCC) 05/26/2019   Limited mobility 05/12/2018   Anxiety state 06/02/2017   Leg swelling 01/29/2017   Idiopathic chronic venous hypertension of both lower extremities with inflammation 09/24/2016   Diabetic polyneuropathy associated with type 2 diabetes mellitus (HCC)    Type 2 diabetes mellitus with neurologic complication, with long-term current use of insulin (HCC) 12/08/2015   Left shoulder pain 08/10/2015   Pulmonary nodule 02/01/2015   Depression 12/09/2013   Warts, genital 08/20/2013   Diabetic retinopathy (HCC) 01/28/2013   Sleep apnea 09/06/2010   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   Hypertension associated with diabetes (HCC) 11/09/2008   COPD, mild (HCC) 10/06/2006   SICKLE-CELL TRAIT 04/26/2006   ERECTILE DYSFUNCTION 04/26/2006   Tobacco use 04/26/2006   Peripheral arterial disease (HCC) 04/26/2006   Allergic rhinitis 04/26/2006   Past Medical History:  Diagnosis Date   Allergy    Arthritis    Bronchitis    Cataract    CHF (congestive heart failure) (HCC)    Chronic kidney disease    Claudication (HCC)    right foot ray  resection   Colon polyps    hyperplastic   COPD (chronic obstructive pulmonary disease) (HCC)    Coronary artery disease    Diabetes mellitus    type II   Genital warts    Gout    Hyperlipidemia    Hypertension    Myocardial infarction (HCC)    Osteomyelitis of third toe of right foot (HCC)    Pneumonia    Status post amputation of toe of right foot (HCC) 09/24/2016   Status post transmetatarsal amputation of foot, 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/2018   STEMI involving left circumflex coronary artery (HCC) 07/12/2018   Coronary  artery disease   Subacute osteomyelitis, right ankle and foot (HCC) 01/29/2016   Testicular mass 04/18/2016    Family History  Problem Relation Age of Onset   Diabetes Mother    Stroke Mother    Heart failure Father    Heart attack Father    Diabetes Sister        multiple siblings   Diabetes Brother        muliple siblings   Heart disease Brother    Colon cancer Neg Hx    Esophageal cancer Neg Hx    Rectal cancer Neg Hx    Stomach cancer Neg Hx     Past Surgical History:  Procedure Laterality Date   AMPUTATION Right 01/31/2016   Procedure: Right 2nd Toe Amputation;  Surgeon: [name], MD;  Location: [facility] OR;  Service: Orthopedics;  Laterality: Right;   AMPUTATION Right 07/08/2018   Procedure: RIGHT TRANSMETATARSAL AMPUTATION;  Surgeon: [name], MD;  Location: [facility] OR;  Service: Orthopedics;  Laterality: Right;   AMPUTATION Right 01/05/2020   Procedure: RIGHT BELOW KNEE AMPUTATION;  Surgeon: [name], MD;  Location: [facility] OR;  Service: Orthopedics;  Laterality: Right;   AMPUTATION Right 02/23/2020   Procedure: RIGHT BELOW KNEE AMPUTATION REVISION;  Surgeon: [name], MD;  Location: [facility] OR;  Service: Orthopedics;  Laterality: Right;   AMPUTATION Left 10/11/2022   Procedure: LEFT GREAT TOE AMPUTATION AND LEFT 2ND TOE AMPUTATION;  Surgeon: [name], MD;  Location: [facility] OR;  Service: Orthopedics;  Laterality: Left;   CATARACT EXTRACTION     right  eye   COLONOSCOPY     CORONARY STENT INTERVENTION N/A 07/12/2018   Procedure: CORONARY STENT INTERVENTION;  Surgeon: Lennette Bihari, MD;  Location: [facility] INVASIVE CV LAB;  Service: Cardiovascular;  Laterality: N/A;   CORONARY/GRAFT ACUTE MI REVASCULARIZATION N/A 07/12/2018   Procedure: Coronary/Graft Acute MI Revascularization;  Surgeon: Lennette Bihari, MD;  Location: [facility] LAB;  Service: Cardiovascular;  Laterality: N/A;   I & D EXTREMITY  04/11/2012   Procedure: IRRIGATION AND DEBRIDEMENT EXTREMITY;  Surgeon: Toni Arthurs, MD;  Location: [facility] OR;  Service: Orthopedics;  Laterality: Right;   LEFT HEART CATH AND CORONARY ANGIOGRAPHY N/A 07/12/2018   Procedure: LEFT HEART CATH AND CORONARY ANGIOGRAPHY;  Surgeon: Lennette Bihari, MD;  Location: [facility] LAB;  Service: Cardiovascular;  Laterality: N/A;   Surgery left great toe     Tear ducts bilateral eyes     TRANSMETATARSAL AMPUTATION Right 07/08/2018   Social History   Occupational History   Occupation: disabled  Tobacco Use   Smoking status: Former    Packs/day: 0.30    Years: 48.00    Additional pack years: 0.00    Total pack years: 14.40    Types: Cigars, Cigarettes    Start date: 07/02/1963    Quit date: 06/01/2019    Years since quitting: 3.5   Smokeless tobacco: Former  Building services engineer Use: Former  Substance and Sexual Activity   Alcohol use: Yes    Alcohol/week: 0.0 standard drinks of alcohol   Drug use: No   Sexual activity: Not on file

## 2022-12-16 ENCOUNTER — Other Ambulatory Visit: Payer: Self-pay

## 2022-12-16 ENCOUNTER — Ambulatory Visit (INDEPENDENT_AMBULATORY_CARE_PROVIDER_SITE_OTHER): Payer: 59 | Admitting: Family Medicine

## 2022-12-16 ENCOUNTER — Ambulatory Visit: Payer: No Typology Code available for payment source | Admitting: Pharmacist

## 2022-12-16 ENCOUNTER — Encounter: Payer: Self-pay | Admitting: Family Medicine

## 2022-12-16 VITALS — BP 103/63 | HR 68 | Ht 70.0 in | Wt 175.0 lb

## 2022-12-16 DIAGNOSIS — E1141 Type 2 diabetes mellitus with diabetic mononeuropathy: Secondary | ICD-10-CM | POA: Diagnosis not present

## 2022-12-16 DIAGNOSIS — M7989 Other specified soft tissue disorders: Secondary | ICD-10-CM

## 2022-12-16 DIAGNOSIS — Z794 Long term (current) use of insulin: Secondary | ICD-10-CM

## 2022-12-16 DIAGNOSIS — R937 Abnormal findings on diagnostic imaging of other parts of musculoskeletal system: Secondary | ICD-10-CM

## 2022-12-16 NOTE — Patient Instructions (Signed)
It was great to see you again today.  Checking labwork today to evaluate the areas seen on your CT scan  Good job with your diabetes    Be well, Dr. Pollie Meyer

## 2022-12-16 NOTE — Progress Notes (Signed)
  Date of Visit: 12/16/2022   SUBJECTIVE:   HPI:  Adam Ray presents today for routine follow-up.  Diabetes: Currently taking Lantus 30 units daily and metformin 1000 mg twice daily.  Following regularly with Dr. Raymondo Band for this.  Recent A1c was well-controlled at 6.6.  Phlegm in throat: Has had phlegm in his throat for a while.  Request medication to help with this.  Feels like it is coming from his stomach.  Abnormal CT scan: Was seen at urology office and had CT urogram ordered by NP to evaluate hematuria.  I was copied on this result and it noted "similar scattered areas of lucency throughout the vertebral bodies and bony pelvis, nonspecific but warranting evaluation for possible underlying hematologic malignancy including multiple myeloma."  Patient denies being told about these areas in his bony pelvis.  He denies any night sweats, weight loss, or fevers.  He is agreeable to evaluation for hematologic malignancy.  Leg swelling: Status post BKA of right lower extremity.  Following regularly with Dr. Lajoyce Corners for wounds and swelling of his left lower extremity.  Reports Dr. Lajoyce Corners to his told him it is only a matter of time before he requires amputation of the left leg as well.  Has chronic swelling in this area.  OBJECTIVE:   BP 103/63   Pulse 68   Ht 5\' 10"  (1.778 m)   Wt 175 lb (79.4 kg)   SpO2 95%   BMI 25.11 kg/m  Gen: No acute distress, pleasant, cooperative HEENT: Normocephalic, atraumatic, OP clear and moist, no anterior cervical lymphadenopathy or supraclavicular lymphadenopathy Heart: Regular rate and rhythm, no murmur  Lungs: Clear to auscultation bilaterally, normal effort Neuro: Alert, grossly nonfocal, speech normal Ext: Status post BKA RLE.  LLE with dressing in place covering entirety of leg from shin down to toes.  Did not unwrap.  Swelling present.  ASSESSMENT/PLAN:   Health maintenance:  -Patient reports he has not yet received the shingles vaccine  Abnormal bone  radiograph Areas of lucency noted on recent CT is incidental finding.  Check labs today to evaluate for hematologic malignancy.  Checking CBC with differential, CMET, SPEP with IFE, free light chains, quantitative immunoglobulins, and peripheral smear.  Anticipate may need to refer to hematology for further evaluation, but will await results of labs.  Type 2 diabetes mellitus with neurologic complication, with long-term current use of insulin (HCC) Continue follow-up with Dr. Raymondo Band.  Appreciate his expertise.  Leg swelling Stable.  Continue regular follow-up with Dr. Lajoyce Corners.  Phlegm in throat - suspect may be related to reflux. Normal oral exam and no anterior cervical lymphadenopathy.   [name], MD [facility]

## 2022-12-17 LAB — IFE, PE AND FLC, SERUM

## 2022-12-17 LAB — CMP14+EGFR
BUN: 16 mg/dL (ref 8–27)
Bilirubin Total: 0.3 mg/dL (ref 0.0–1.2)
Calcium: 9 mg/dL (ref 8.6–10.2)
Glucose: 110 mg/dL — ABNORMAL HIGH (ref 70–99)
Potassium: 4 mmol/L (ref 3.5–5.2)

## 2022-12-17 LAB — CBC WITH DIFFERENTIAL/PLATELET

## 2022-12-18 DIAGNOSIS — R937 Abnormal findings on diagnostic imaging of other parts of musculoskeletal system: Secondary | ICD-10-CM | POA: Insufficient documentation

## 2022-12-18 LAB — PATHOLOGIST SMEAR REVIEW
Basos: 2 %
EOS (ABSOLUTE): 0.1 10*3/uL (ref 0.0–0.4)
Eos: 3 %
MCHC: 33.2 g/dL (ref 31.5–35.7)
MCV: 83 fL (ref 79–97)
RBC: 4.17 x10E6/uL (ref 4.14–5.80)
RDW: 13.5 % (ref 11.6–15.4)

## 2022-12-18 LAB — CBC WITH DIFFERENTIAL/PLATELET
Basophils Absolute: 0 10*3/uL (ref 0.0–0.2)
Hematocrit: 34.6 % — ABNORMAL LOW (ref 37.5–51.0)
Lymphocytes Absolute: 1.1 10*3/uL (ref 0.7–3.1)
Lymphs: 40 %

## 2022-12-18 LAB — IFE, PE AND FLC, SERUM

## 2022-12-18 LAB — CMP14+EGFR
ALT: 20 IU/L (ref 0–44)
Chloride: 102 mmol/L (ref 96–106)
Globulin, Total: 2.1 g/dL (ref 1.5–4.5)
eGFR: 82 mL/min/{1.73_m2} (ref >59)

## 2022-12-18 LAB — IMMUNOGLOBULINS A/E/G/M, SERUM

## 2022-12-18 NOTE — Assessment & Plan Note (Signed)
Stable.  Continue regular follow-up with Dr. Lajoyce Corners.

## 2022-12-18 NOTE — Assessment & Plan Note (Signed)
Continue follow-up with Dr. Raymondo Band.  Appreciate his expertise.

## 2022-12-18 NOTE — Assessment & Plan Note (Signed)
Areas of lucency noted on recent CT is incidental finding.  Check labs today to evaluate for hematologic malignancy.  Checking CBC with differential, CMET, SPEP with IFE, free light chains, quantitative immunoglobulins, and peripheral smear.  Anticipate may need to refer to hematology for further evaluation, but will await results of labs.

## 2022-12-19 ENCOUNTER — Encounter: Payer: Self-pay | Admitting: Family Medicine

## 2022-12-19 LAB — CMP14+EGFR
Albumin: 4.1 g/dL (ref 3.9–4.9)
Alkaline Phosphatase: 92 IU/L (ref 44–121)

## 2022-12-19 LAB — IFE, PE AND FLC, SERUM

## 2022-12-19 LAB — CBC WITH DIFFERENTIAL/PLATELET
Hemoglobin: 11.4 g/dL — ABNORMAL LOW (ref 13.0–17.7)
Immature Granulocytes: 0 %
MCH: 27.5 pg (ref 26.6–33.0)
Monocytes Absolute: 0.5 10*3/uL (ref 0.1–0.9)
Neutrophils: 37 %
RBC: 4.15 x10E6/uL (ref 4.14–5.80)
WBC: 2.8 10*3/uL — ABNORMAL LOW (ref 3.4–10.8)

## 2022-12-20 LAB — PATHOLOGIST SMEAR REVIEW
Basophils Absolute: 0 10*3/uL (ref 0.0–0.2)
Hematocrit: 34.6 % — ABNORMAL LOW (ref 37.5–51.0)
Hemoglobin: 11.5 g/dL — ABNORMAL LOW (ref 13.0–17.7)
Immature Grans (Abs): 0 10*3/uL (ref 0.0–0.1)
Immature Granulocytes: 0 %
Lymphocytes Absolute: 1.1 10*3/uL (ref 0.7–3.1)
Lymphs: 40 %
MCH: 27.6 pg (ref 26.6–33.0)
Monocytes Absolute: 0.5 10*3/uL (ref 0.1–0.9)
Monocytes: 18 %
Neutrophils Absolute: 1 10*3/uL — ABNORMAL LOW (ref 1.4–7.0)
Neutrophils: 37 %
Platelets: 97 10*3/uL — CL (ref 150–450)
WBC: 2.8 10*3/uL — ABNORMAL LOW (ref 3.4–10.8)

## 2022-12-21 LAB — CMP14+EGFR
AST: 25 IU/L (ref 0–40)
BUN/Creatinine Ratio: 16 (ref 10–24)
CO2: 26 mmol/L (ref 20–29)
Creatinine, Ser: 1 mg/dL (ref 0.76–1.27)
Sodium: 141 mmol/L (ref 134–144)

## 2022-12-21 LAB — IFE, PE AND FLC, SERUM
Albumin SerPl Elph-Mcnc: 3.7 g/dL (ref 2.9–4.4)
Alpha 1: 0.2 g/dL (ref 0.0–0.4)
B-Globulin SerPl Elph-Mcnc: 1 g/dL (ref 0.7–1.3)
Gamma Glob SerPl Elph-Mcnc: 0.9 g/dL (ref 0.4–1.8)
Globulin, Total: 2.5 g/dL (ref 2.2–3.9)
Ig Lambda Free Light Chain: 25.4 mg/L (ref 5.7–26.3)
IgA/Immunoglobulin A, Serum: 298 mg/dL (ref 61–437)

## 2022-12-21 LAB — CBC WITH DIFFERENTIAL/PLATELET
EOS (ABSOLUTE): 0.1 10*3/uL (ref 0.0–0.4)
Eos: 4 %
Platelets: 91 10*3/uL — CL (ref 150–450)

## 2022-12-23 ENCOUNTER — Encounter: Payer: Self-pay | Admitting: Orthopedic Surgery

## 2022-12-23 ENCOUNTER — Telehealth: Payer: Self-pay | Admitting: Family Medicine

## 2022-12-23 ENCOUNTER — Telehealth: Payer: Self-pay | Admitting: Orthopedic Surgery

## 2022-12-23 ENCOUNTER — Ambulatory Visit (INDEPENDENT_AMBULATORY_CARE_PROVIDER_SITE_OTHER): Payer: 59 | Admitting: Orthopedic Surgery

## 2022-12-23 DIAGNOSIS — T8131XA Disruption of external operation (surgical) wound, not elsewhere classified, initial encounter: Secondary | ICD-10-CM

## 2022-12-23 DIAGNOSIS — I890 Lymphedema, not elsewhere classified: Secondary | ICD-10-CM

## 2022-12-23 DIAGNOSIS — B879 Myiasis, unspecified: Secondary | ICD-10-CM

## 2022-12-23 DIAGNOSIS — I739 Peripheral vascular disease, unspecified: Secondary | ICD-10-CM

## 2022-12-23 DIAGNOSIS — D61818 Other pancytopenia: Secondary | ICD-10-CM

## 2022-12-23 DIAGNOSIS — I872 Venous insufficiency (chronic) (peripheral): Secondary | ICD-10-CM

## 2022-12-23 NOTE — Telephone Encounter (Signed)
SW wife she will bring him in at 1 pm today

## 2022-12-23 NOTE — Progress Notes (Signed)
Office Visit Note   Patient: Adam Ray           Date of Birth: Jul 14, 1955           MRN: [id number] Visit Date: 12/23/2022              Requested by: Latrelle Dodrill, MD [address] [place],  [place] [address] PCP: Latrelle Dodrill, MD  Chief Complaint  Patient presents with   Left Foot - Routine Post Op    10/11/2022 left GT and second toe amputation       HPI: Patient is a 67 year old gentleman who is 2 months status post left great toe and second toe amputation.  He is status post transtibial amputation on the right.  Patient presents today with acute maggot infestation of the wound.  Assessment & Plan: Visit Diagnoses:  1. 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 Peripheral vascular disease (HCC)   3. Maggot infestation   4. Postoperative wound dehiscence, initial encounter     Plan: Will plan for transmetatarsal amputation on Wednesday.  Risk and benefits were discussed including risk of the wound not healing need for additional surgery.  Patient and his wife state they understand wish to proceed at this time.  Follow-Up Instructions: Return in about 2 weeks (around 01/06/2023).   Ortho Exam  Patient is alert, oriented, no adenopathy, well-dressed, normal affect, normal respiratory effort. Examination patient does have venous and lymphatic swelling of the left lower extremity.  He has a palpable dorsalis pedis pulse.  There is wound dehiscence with infestation with maggots of the wound.  There is no ascending cellulitis no purulent drainage.  Imaging: No results found.   Labs: Lab Results  Component Value Date   HGBA1C 6.6 11/18/2022   HGBA1C 7.9 (A) 07/05/2022   HGBA1C 9.4 (H) 01/17/2022   ESRSEDRATE 54 (H) 01/01/2020   ESRSEDRATE 8 01/29/2016   ESRSEDRATE 9 04/11/2012   CRP 2.6 (H) 01/29/2016   CRP 0.5 02/28/2014   REPTSTATUS 01/18/2020 FINAL 01/17/2020   REPTSTATUS 01/22/2020 FINAL 01/17/2020   REPTSTATUS 01/22/2020 FINAL  01/17/2020   GRAMSTAIN  04/11/2012    NO WBC SEEN RARE SQUAMOUS EPITHELIAL CELLS PRESENT MODERATE GRAM POSITIVE COCCI IN PAIRS RARE GRAM NEGATIVE RODS   GRAMSTAIN  04/11/2012    NO WBC SEEN RARE SQUAMOUS EPITHELIAL CELLS PRESENT MODERATE GRAM POSITIVE COCCI IN PAIRS RARE GRAM NEGATIVE RODS   CULT (A) 01/17/2020    <10,000 COLONIES/mL INSIGNIFICANT GROWTH Performed at [facility], [address] [place], [place] [address]    CULT  01/17/2020    NO GROWTH 5 DAYS Performed at [facility], [address] [place], [place] [address]    CULT  01/17/2020    NO GROWTH 5 DAYS Performed at [facility], [address], [place], [place] [address]    LABORGA NO GROWTH 2 DAYS 07/19/2014     Lab Results  Component Value Date   ALBUMIN 4.1 12/16/2022   ALBUMIN 4.5 01/17/2022   ALBUMIN 4.1 12/06/2020    No results found for: "MG" No results found for: "VD25OH"  No results found for: "PREALBUMIN"    Latest Ref Rng & Units 12/16/2022    5:13 PM 12/16/2022    5:12 PM 10/11/2022    8:09 AM  CBC EXTENDED  WBC 3.4 - 10.8 x10E3/uL 2.8  2.8  3.1   RBC 4.14 - 5.80 x10E6/uL 4.17  4.15  4.17  Hemoglobin 13.0 - 17.7 g/dL 21.3  08.6  57.8   HCT 37.5 - 51.0 % 34.6  34.6  36.6   Platelets 150 - 450 x10E3/uL 97  91  117   NEUT# 1.4 - 7.0 x10E3/uL 1.0  1.0    Lymph# 0.7 - 3.1 x10E3/uL 1.1  1.1       There is no height or weight on file to calculate BMI.  Orders:  No orders of the defined types were placed in this encounter.  No orders of the defined types were placed in this encounter.    Procedures: No procedures performed  Clinical Data: No additional findings.  ROS:  All other systems negative, except as noted in the HPI. Review of Systems  Objective: Vital Signs: There were no vitals taken for this visit.  Specialty Comments:  No specialty comments available.  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   Atrophy of tongue papillae 10/02/2022   Atherosclerosis of coronary artery without angina pectoris 10/02/2022   Age-related nuclear cataract of left eye 10/02/2022   Brown hairy tongue 10/02/2022   Proliferative diabetic retinopathy of right eye associated with type 2 diabetes mellitus (HCC) 10/02/2022   Tongue discoloration 02/14/2022   Hematuria 08/16/2021   Cervical spondylosis without myelopathy 12/14/2020   Pain in joint of left shoulder 12/14/2020   Urinary incontinence 08/10/2020   Increased urinary frequency 05/09/2020   Nocturnal leg cramps 10/28/2019   Hyperlipidemia associated with type 2 diabetes mellitus (HCC) 05/26/2019   Limited mobility 05/12/2018   Anxiety state 06/02/2017   Leg swelling 01/29/2017   Idiopathic chronic venous hypertension of both lower extremities with inflammation 09/24/2016   Diabetic polyneuropathy associated with type 2 diabetes mellitus (HCC)    Type 2 diabetes mellitus with neurologic complication, with long-term current use of insulin (HCC) 12/08/2015   Left shoulder pain 08/10/2015   Pulmonary nodule 02/01/2015   Depression 12/09/2013   Warts, genital 08/20/2013   Diabetic retinopathy (HCC) 01/28/2013   Sleep apnea 09/06/2010   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   Hypertension associated with diabetes (HCC) 11/09/2008   COPD, mild (HCC) 10/06/2006   SICKLE-CELL TRAIT 04/26/2006   ERECTILE DYSFUNCTION 04/26/2006   Tobacco use 04/26/2006   Peripheral arterial disease (HCC) 04/26/2006   Allergic rhinitis 04/26/2006   Past Medical History:  Diagnosis Date   Allergy    Arthritis    Bronchitis    Cataract    CHF (congestive heart failure) (HCC)    Chronic kidney disease    Claudication (HCC)    right foot ray  resection   Colon polyps    hyperplastic   COPD (chronic obstructive pulmonary disease) (HCC)    Coronary artery disease    Diabetes mellitus    type II   Genital warts    Gout    Hyperlipidemia    Hypertension    Myocardial infarction (HCC)    Osteomyelitis of third toe of right foot (HCC)    Pneumonia    Status post amputation of toe of right foot (HCC) 09/24/2016   Status post transmetatarsal amputation of foot, 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/2018   STEMI involving left circumflex coronary artery (HCC) 07/12/2018   Coronary artery disease   Subacute osteomyelitis, right  ankle and foot (HCC) 01/29/2016   Testicular mass 04/18/2016    Family History  Problem Relation Age of Onset   Diabetes Mother    Stroke Mother    Heart failure Father    Heart attack Father    Diabetes Sister        multiple siblings   Diabetes Brother        muliple siblings   Heart disease Brother    Colon cancer Neg Hx    Esophageal cancer Neg Hx    Rectal cancer Neg Hx    Stomach cancer Neg Hx     Past Surgical History:  Procedure Laterality Date   AMPUTATION Right 01/31/2016   Procedure: Right 2nd Toe Amputation;  Surgeon: [name], MD;  Location: [facility] OR;  Service: Orthopedics;  Laterality: Right;   AMPUTATION Right 07/08/2018   Procedure: RIGHT TRANSMETATARSAL AMPUTATION;  Surgeon: [name], MD;  Location: [facility] OR;  Service: Orthopedics;  Laterality: Right;   AMPUTATION Right 01/05/2020   Procedure: RIGHT BELOW KNEE AMPUTATION;  Surgeon: [name], MD;  Location: [facility] OR;  Service: Orthopedics;  Laterality: Right;   AMPUTATION Right 02/23/2020   Procedure: RIGHT BELOW KNEE AMPUTATION REVISION;  Surgeon: [name], MD;  Location: [facility] OR;  Service: Orthopedics;  Laterality: Right;   AMPUTATION Left 10/11/2022   Procedure: LEFT GREAT TOE AMPUTATION AND LEFT 2ND TOE AMPUTATION;  Surgeon: [name], MD;  Location: [facility] OR;  Service: Orthopedics;  Laterality: Left;   CATARACT EXTRACTION     right  eye   COLONOSCOPY     CORONARY STENT INTERVENTION N/A 07/12/2018   Procedure: CORONARY STENT INTERVENTION;  Surgeon: Lennette Bihari, MD;  [facility] LAB;  Service: Cardiovascular;  Laterality: N/A;   CORONARY/GRAFT ACUTE MI REVASCULARIZATION N/A 07/12/2018   Procedure: Coronary/Graft Acute MI Revascularization;  Surgeon: Lennette Bihari, MD;  [facility]: [facility];  Service: Cardiovascular;  Laterality: N/A;   I & D EXTREMITY  04/11/2012   Procedure: IRRIGATION AND DEBRIDEMENT EXTREMITY;  Surgeon: Toni Arthurs, MD;  Location: [facility] OR;  Service: Orthopedics;  Laterality: Right;   LEFT HEART CATH AND CORONARY ANGIOGRAPHY N/A 07/12/2018   Procedure: LEFT HEART CATH AND CORONARY ANGIOGRAPHY;  Surgeon: Lennette Bihari, MD;  [facility];  Service: Cardiovascular;  Laterality: N/A;   Surgery left great toe     Tear ducts bilateral eyes     TRANSMETATARSAL AMPUTATION Right 07/08/2018   Social History   Occupational History   Occupation: disabled  Tobacco Use   Smoking status: Former    Packs/day: 0.30    Years: 48.00    Additional pack years: 0.00    Total pack years: 14.40    Types: Cigars, Cigarettes    Start date: 07/02/1963    Quit date: 06/01/2019    Years since quitting: 3.5   Smokeless tobacco: Former  Building services engineer Use: Former  Substance and Sexual Activity   Alcohol use: Yes    Alcohol/week: 0.0 standard drinks of alcohol   Drug use: No   Sexual activity: Not on file

## 2022-12-23 NOTE — Telephone Encounter (Signed)
Pt's wife states pt need to be seen today. He has magets in his wound and need to be seen today. Please call pt with appt today. Pt phone is [phone].

## 2022-12-23 NOTE — Telephone Encounter (Signed)
Called patient to discuss labs.  No M spike detected on SPEP.  Elevated free kappa light chain, but normal kappa/lambda ratio.  Does have leukopenia, anemia, and thrombocytopenia.  At this point I think he would best be served by seeing a hematologist.  Discussed this with him, he is agreeable.  Referral entered.  Latrelle Dodrill, MD

## 2022-12-24 ENCOUNTER — Encounter (HOSPITAL_COMMUNITY): Payer: Self-pay | Admitting: Orthopedic Surgery

## 2022-12-24 NOTE — Progress Notes (Signed)
Case: [id number] Date/Time: 12/25/22 0957   Procedure: LEFT TRANSMETATARSAL AMPUTATION (Left: Foot)   Anesthesia type: Choice   Pre-op diagnosis: Gangrene Left Foot   [facility] ROOM 04 / [facility]   Surgeons: [name], MD       DISCUSSION: Adam Ray is a 67 yo male who is being evaluated prior to surgery listed above. 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 Currently being referred for hematologic eval for pancytopenia.    No prior anesthesia complications. Patient had 1st and 2nd toe amputations on 10/11/22. Now has wound dehiscence with maggot infestation so is scheduled for TMA.   Last cardiology visit with Dr. Allyson Sabal was on 07/26/22. No chest pain or SOB. Lasix increased to BID for LE edema thought to be related to diastolic dysfunction +/- venous insufficiency. Six month follow-up planned.    He is a same day work-up, so anesthesia team to evaluate on the day of surgery. CBC on 6/17 remarkable for pancytopenia (WBC 2.8, Hgb 11.5, Platelets 97). During prior surgery on 10/11/22 patient had popliteal block (platelets were 117 at that time).   Last took his Trulicity (weekly dosing) on 6/24. Discussed with Dr. Krista Blue. Ok to proceed due to urgency of case.  VS:     12/24/2022    2:13 PM 12/16/2022   10:29 AM 12/02/2022   11:22 AM  Vitals with BMI  Height 5\' 10"  5\' 10"    Weight 170 lbs 175 lbs   BMI 24.39 25.11   Systolic  103 105  Diastolic  63 60  Pulse  68 78     PROVIDERS: Latrelle Dodrill, MD is PCP. He also receives some care through the [facility]. Nanetta Batty, MD is cardiologist Lemar Livings, MD is vascular surgeon   LABS: Labs reviewed: Acceptable for surgery. (all labs ordered are listed, but only abnormal results are displayed)  Labs Reviewed - No data to display   IMAGES: CTA  Abd/pelvis 07/23/22: MPRESSION: VASCULAR  1. Mild atherosclerotic disease in the abdominal aorta and iliac arteries. No evidence for aneurysm, dissection or significant stenosis involving the abdominopelvic arterial structures. 2. Limited evaluation of the veins due to the arterial phase of contrast. However, the IVC and iliac veins are very similar to the exam on 09/12/2021. No significant compression on the left common iliac vein. 3. Left common femoral vein is slightly enlarged. Previous lower extremity venous duplex on 07/03/2022 was negative for DVT but if there is clinical concern for DVT recommend a repeat ultrasound examination.  NON-VASCULAR 1. No acute abnormality in the abdomen or pelvis. 2. Scattered lucent areas throughout the bones particularly in the pelvis. Similar findings on previous examinations but this may have progressed or the findings are more conspicuous on this examination. Consider hematologic evaluation.   CT Chest LCS 01/25/22: IMPRESSION: 1. Lung-RADS 1S, negative. Continue annual screening with low-dose chest CT without contrast in 12 months. 2. The "S" modifier above refers to potentially clinically significant non lung cancer related findings. Specifically, there is aortic atherosclerosis, in addition to left circumflex coronary artery disease. Please note that although the presence of coronary artery calcium documents the presence of coronary artery disease, the severity of this disease and any potential stenosis cannot be assessed on this non-gated CT examination. Assessment for potential risk factor modification, dietary therapy or pharmacologic therapy may be warranted,  if clinically indicated. 3. Mild diffuse bronchial wall thickening with mild centrilobular and paraseptal emphysema; imaging findings suggestive of underlying COPD. 4. There are calcifications of the aortic valve. Echocardiographic correlation for evaluation of potential  valvular dysfunction may be warranted if clinically indicated. - Aortic Atherosclerosis (ICD10-I70.0) and Emphysema (ICD10-J43.9).     EKG: EKG 07/26/22: Sinus rhythm with first degree AV block Left axis deviation Voltage criteria for LVH Anteroseptal infarct, age undetermined     CV: Echo 07/13/18: Study Conclusions  - Left ventricle: The cavity size was normal. Wall thickness was    normal. Systolic function was vigorous. The estimated ejection    fraction was in the range of 65% to 70%. Wall motion was normal;    there were no regional wall motion abnormalities. Doppler    parameters are consistent with abnormal left ventricular    relaxation (grade 1 diastolic dysfunction). The E&'e&' ratio is    >15, suggesting elevated LV filling pressure.  - Aortic valve: Bicuspid aortic valve with thickened leaflets. No    significant stenosis. There was no regurgitation.  - Mitral valve: Mildly thickened leaflets . There was mild    regurgitation.  - Left atrium: The atrium was normal in size.  - Inferior vena cava: The vessel was dilated. The respirophasic    diameter changes were blunted (< 50%), consistent with elevated    central venous pressure.  Impressions:  - LVEF 65-70%, normal wall thickness, normal wall motion, grade 1    DD, elevated LV filling pressure, bicuspid aortic valve is again    noted (compared to prior echo in 2011) without stenosis or    regurgitation, mild MR, normal LA size, dilated IVC.      Cardiac cath PCI 07/12/18: Prox Cx to Mid Cx lesion is 95% stenosed. Post intervention, there is a 0% residual stenosis. A stent was successfully placed.   Acute inferior ST segment elevation myocardial infarction secondary to subtotal occlusion of a codominant mid left circumflex coronary artery.   Normal LAD and RCA.   LVEDP 20 mmHg.   Successful PCI to the mid left circumflex coronary artery with ultimate insertion of a 2.25 x 24 mm Synergy DES stent postdilated  to 2.4 mm with the stenosis being reduced to 0%.   RECOMMENDATION: DAPT therapy for a minimum of 12 months of aspirin/Brilinta.  Optimal blood pressure control.  Aggressive lipid intervention with target LDL less than 70.  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   Status post transmetatarsal amputation of foot, 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/2018   STEMI involving left circumflex coronary artery (HCC) 07/12/2018   Coronary artery disease   Subacute osteomyelitis, right ankle and foot (HCC) 01/29/2016   Testicular mass 04/18/2016    Past Surgical History:  Procedure Laterality Date  AMPUTATION Right 01/31/2016   Procedure: Right 2nd Toe Amputation;  Surgeon: [name], MD;  Location: [facility] OR;  Service: Orthopedics;  Laterality: Right;   AMPUTATION Right 07/08/2018   Procedure: RIGHT TRANSMETATARSAL AMPUTATION;  Surgeon: [name], MD;  Location: [facility] OR;  Service: Orthopedics;  Laterality: Right;   AMPUTATION Right 01/05/2020   Procedure: RIGHT BELOW KNEE AMPUTATION;  Surgeon: [name], MD;  Location: [facility] OR;  Service: Orthopedics;  Laterality: Right;   AMPUTATION Right 02/23/2020   Procedure: RIGHT BELOW KNEE AMPUTATION REVISION;  Surgeon: [name], MD;  Location: [facility] OR;  Service: Orthopedics;  Laterality: Right;   AMPUTATION Left 10/11/2022   Procedure: LEFT GREAT TOE AMPUTATION AND LEFT 2ND TOE AMPUTATION;  Surgeon: [name], MD;  Location: [facility] OR;   Service: Orthopedics;  Laterality: Left;   CATARACT EXTRACTION     right eye   COLONOSCOPY     CORONARY STENT INTERVENTION N/A 07/12/2018   Procedure: CORONARY STENT INTERVENTION;  Surgeon: Lennette Bihari, MD;  [facility];  Service: Cardiovascular;  Laterality: N/A;   CORONARY/GRAFT ACUTE MI REVASCULARIZATION N/A 07/12/2018   Procedure: Coronary/Graft Acute MI Revascularization;  Surgeon: Lennette Bihari, MD;  [facility];  Service: Cardiovascular;  Laterality: N/A;   I & D EXTREMITY  04/11/2012   Procedure: IRRIGATION AND DEBRIDEMENT EXTREMITY;  Surgeon: Toni Arthurs, MD;  Location: [facility] OR;  Service: Orthopedics;  Laterality: Right;   LEFT HEART CATH AND CORONARY ANGIOGRAPHY N/A 07/12/2018   Procedure: LEFT HEART CATH AND CORONARY ANGIOGRAPHY;  Surgeon: Lennette Bihari, MD;  [facility];  Service: Cardiovascular;  Laterality: N/A;   Surgery left great toe     Tear ducts bilateral eyes     TRANSMETATARSAL AMPUTATION Right 07/08/2018    MEDICATIONS: No current facility-administered medications for this encounter.    acetaminophen (TYLENOL) 500 MG tablet   aspirin 81 MG chewable tablet   Cholecalciferol (VITAMIN D3) 50 MCG (2000 UT) TABS   CINNAMON PO   diclofenac Sodium (VOLTAREN ARTHRITIS PAIN) 1 % GEL   Dulaglutide (TRULICITY) 1.5 MG/0.5ML SOPN   famotidine (PEPCID) 20 MG tablet   furosemide (LASIX) 20 MG tablet   insulin glargine (LANTUS SOLOSTAR) 100 UNIT/ML Solostar Pen   metFORMIN (GLUCOPHAGE) 500 MG tablet   metoprolol tartrate (LOPRESSOR) 25 MG tablet   Multiple Vitamin (MULTIVITAMIN WITH MINERALS) TABS tablet   rosuvastatin (CRESTOR) 40 MG tablet   tamsulosin (FLOMAX) 0.4 MG CAPS capsule   tolterodine (DETROL LA) 4 MG 24 hr capsule   Vibegron (GEMTESA) 75 MG TABS   Continuous Glucose Receiver (FREESTYLE LIBRE 3 READER) DEVI   Continuous Glucose Sensor (FREESTYLE LIBRE 3 SENSOR) MISC   sildenafil (VIAGRA) 100 MG tablet    Marcille Blanco [facility] Surgical Short Stay/Anesthesiology [facility] Phone [phone] 12/24/2022 2:31 PM

## 2022-12-24 NOTE — Progress Notes (Signed)
SDW call  Patient was given pre-op instructions over the phone. Patient verbalized understanding of instructions provided.    PCP - Dr. Levert Feinstein Cardiologist - Dr. Allyson Sabal Endocrinologist: Dr. Raymondo Band Pulmonary:    PPM/ICD - denies  Chest x-ray - n/a EKG -  07/26/2022 Stress Test - ECHO - 07/13/2018 Cardiac Cath -   Sleep Study/sleep apnea/CPAP: denies  Type II diabetic Fasting Blood sugar range: 110-250 How often check sugars: daily Trulicity, states last dose yesterday 12/23/2022 Metformin, instructed to hold day of surgery Insulin glargine (Lantus solostar), instructed to only take 15 units (50% regular dose) this evening 12/24/2022 and day of surgery 12/25/2022   Blood Thinner Instructions: denies Aspirin Instructions:denies   ERAS Protcol - Yes, clear fluids until 0700 PRE-SURGERY Ensure or G2-    COVID TEST- n/a    Anesthesia review: Yes. HTN, DM, CHF, CAD, MI, COPD, GLP-1   Patient denies shortness of breath, fever, cough and chest pain over the phone call  Your procedure is scheduled on Wednesday December 25, 2022  Report to [facility] Main Entrance "A" at  0745  A.M., then check in with the Admitting office.  Call this number if you have problems the morning of surgery:  [phone]   If you have any questions prior to your surgery date call [phone]: Open Monday-Friday 8am-4pm If you experience any cold or flu symptoms such as cough, fever, chills, shortness of breath, etc. between now and your scheduled surgery, please notify us at the above number    Remember:  Do not eat after midnight the night before your surgery  You may drink clear liquids until 0700  the morning of your surgery.   Clear liquids allowed are: Water, Non-Citrus Juices (without pulp), Carbonated Beverages, Clear Tea, Black Coffee ONLY (NO MILK, CREAM OR POWDERED CREAMER of any kind), and Gatorade   Take these medicines the morning of surgery with A SIP OF WATER:  ASA, metoprolol,  crestor, flomax, detrol LA, vibegran  As needed: Tylenol, pepcid  As of today, STOP taking any Aspirin (unless otherwise instructed by your surgeon) Aleve, Naproxen, Ibuprofen, Motrin, Advil, Goody's, BC's, all herbal medications, fish oil, and all vitamins.

## 2022-12-24 NOTE — Anesthesia Preprocedure Evaluation (Addendum)
Anesthesia Evaluation  Patient identified by MRN, date of birth, ID band Patient awake    Reviewed: Allergy & Precautions, NPO status , Patient's Chart, lab work & pertinent test results, Unable to perform ROS - Chart review only  Airway Mallampati: II  TM Distance: >3 FB Neck ROM: Full    Dental no notable dental hx. (+) Teeth Intact, Dental Advisory Given   Pulmonary COPD, former smoker   Pulmonary exam normal breath sounds clear to auscultation       Cardiovascular hypertension, + CAD, + Past MI and +CHF  Normal cardiovascular exam Rhythm:Regular Rate:Normal  2020 Tte Left ventricle: The cavity size was normal. Wall thickness was    normal. Systolic function was vigorous. The estimated ejection    fraction was in the range of 65% to 70%. Wall motion was normal;    there were no regional wall motion abnormalities. Doppler    parameters are consistent with abnormal left ventricular    relaxation (grade 1 diastolic dysfunction). The E&'e&' ratio is    >15, suggesting elevated LV filling pressure.  - Aortic valve: Bicuspid aortic valve with thickened leaflets. No    significant stenosis. There was no regurgitation.  - Mitral valve: Mildly thickened leaflets . There was mild    regurgitation.  - Left atrium: The atrium was normal in size.  - Inferior vena cava: The vessel was dilated. The respirophasic    diameter changes were blunted (< 50%), consistent with elevated    central venous pressure.     Neuro/Psych   Anxiety Depression       GI/Hepatic negative GI ROS, Neg liver ROS,,,  Endo/Other  diabetes, Type 2    Renal/GU Lab Results      Component                Value               Date                      CREATININE               1.00                12/16/2022                     K                        4.0                 12/16/2022                          Musculoskeletal  (+) Arthritis ,    Abdominal    Peds  Hematology Lab Results      Component                Value               Date                       HGB                      11.5 (L)            12/16/2022                HCT  34.6 (L)            12/16/2022                       PLT                      97 (LL)             12/16/2022              Anesthesia Other Findings All: codeine  Reproductive/Obstetrics                             Anesthesia Physical Anesthesia Plan  ASA: 3  Anesthesia Plan: General   Post-op Pain Management: Ketamine IV* and Toradol IV (intra-op)*   Induction: Cricoid pressure planned, Rapid sequence and Intravenous  PONV Risk Score and Plan: Midazolam, Propofol infusion and Treatment may vary due to age or medical condition  Airway Management Planned: Oral ETT  Additional Equipment: None  Intra-op Plan:   Post-operative Plan: Extubation in OR  Informed Consent: I have reviewed the patients History and Physical, chart, labs and discussed the procedure including the risks, benefits and alternatives for the proposed anesthesia with the patient or authorized representative who has indicated his/her understanding and acceptance.     Dental advisory given  Plan Discussed with:   Anesthesia Plan Comments: (See PAT note from 6/25 by K Gekas PA-C. Platelets 97. Took Trulicity 6/24. Discussed with Dr. Krista Blue - ok to proceed if emergent )       Anesthesia Quick Evaluation

## 2022-12-24 NOTE — Progress Notes (Signed)
Spoke with patient for SDW, states he wants to re-schedule his surgery.  Advised patient to reach out to Dr. Audrie Lia office so they can re-schedule and remove him from the surgery schedule.  I left a message with triage desk at Dr. Audrie Lia office requesting they reach out to the patient as well. Made Shay at surgery scheduling aware.

## 2022-12-24 NOTE — Telephone Encounter (Signed)
Patsy with preadmission testing at [facility] stating she called pt about surgery tomorrow. He told her he wants to reschedule this surgery to another day. Patsy would like a call back once surgery has been rescheduled. [phone]

## 2022-12-25 ENCOUNTER — Encounter (HOSPITAL_COMMUNITY)
Admission: RE | Disposition: A | Discharge status: Home / Self Care | Payer: Self-pay | Source: Ambulatory Visit | Attending: Orthopedic Surgery

## 2022-12-25 ENCOUNTER — Ambulatory Visit (HOSPITAL_BASED_OUTPATIENT_CLINIC_OR_DEPARTMENT_OTHER): Payer: 59 | Admitting: Medical

## 2022-12-25 ENCOUNTER — Other Ambulatory Visit: Payer: Self-pay | Admitting: Orthopedic Surgery

## 2022-12-25 ENCOUNTER — Ambulatory Visit (HOSPITAL_COMMUNITY): Payer: 59 | Admitting: Medical

## 2022-12-25 ENCOUNTER — Ambulatory Visit: Payer: No Typology Code available for payment source | Admitting: Pharmacist

## 2022-12-25 ENCOUNTER — Ambulatory Visit (HOSPITAL_COMMUNITY)
Admission: RE | Admit: 2022-12-25 | Discharge: 2022-12-25 | Disposition: A | Discharge status: Home / Self Care | Payer: 59 | Source: Ambulatory Visit | Attending: Orthopedic Surgery | Admitting: Orthopedic Surgery

## 2022-12-25 ENCOUNTER — Other Ambulatory Visit: Payer: Self-pay

## 2022-12-25 ENCOUNTER — Telehealth: Payer: Self-pay | Admitting: Orthopedic Surgery

## 2022-12-25 DIAGNOSIS — B871 Wound myiasis: Secondary | ICD-10-CM | POA: Diagnosis not present

## 2022-12-25 DIAGNOSIS — E1122 Type 2 diabetes mellitus with diabetic chronic kidney disease: Secondary | ICD-10-CM | POA: Diagnosis not present

## 2022-12-25 DIAGNOSIS — I872 Venous insufficiency (chronic) (peripheral): Secondary | ICD-10-CM | POA: Diagnosis not present

## 2022-12-25 DIAGNOSIS — B879 Myiasis, unspecified: Secondary | ICD-10-CM

## 2022-12-25 DIAGNOSIS — Z87891 Personal history of nicotine dependence: Secondary | ICD-10-CM | POA: Diagnosis not present

## 2022-12-25 DIAGNOSIS — N189 Chronic kidney disease, unspecified: Secondary | ICD-10-CM | POA: Insufficient documentation

## 2022-12-25 DIAGNOSIS — T8781 Dehiscence of amputation stump: Secondary | ICD-10-CM | POA: Diagnosis not present

## 2022-12-25 DIAGNOSIS — Z89511 Acquired absence of right leg below knee: Secondary | ICD-10-CM | POA: Diagnosis not present

## 2022-12-25 DIAGNOSIS — J449 Chronic obstructive pulmonary disease, unspecified: Secondary | ICD-10-CM | POA: Diagnosis not present

## 2022-12-25 DIAGNOSIS — I252 Old myocardial infarction: Secondary | ICD-10-CM | POA: Diagnosis not present

## 2022-12-25 DIAGNOSIS — I130 Hypertensive heart and chronic kidney disease with heart failure and stage 1 through stage 4 chronic kidney disease, or unspecified chronic kidney disease: Secondary | ICD-10-CM | POA: Diagnosis not present

## 2022-12-25 DIAGNOSIS — Y835 Amputation of limb(s) as the cause of abnormal reaction of the patient, or of later complication, without mention of misadventure at the time of the procedure: Secondary | ICD-10-CM | POA: Insufficient documentation

## 2022-12-25 DIAGNOSIS — E1152 Type 2 diabetes mellitus with diabetic peripheral angiopathy with gangrene: Secondary | ICD-10-CM | POA: Diagnosis not present

## 2022-12-25 DIAGNOSIS — T8131XA Disruption of external operation (surgical) wound, not elsewhere classified, initial encounter: Secondary | ICD-10-CM | POA: Diagnosis not present

## 2022-12-25 DIAGNOSIS — M1990 Unspecified osteoarthritis, unspecified site: Secondary | ICD-10-CM | POA: Diagnosis not present

## 2022-12-25 DIAGNOSIS — F418 Other specified anxiety disorders: Secondary | ICD-10-CM | POA: Diagnosis not present

## 2022-12-25 DIAGNOSIS — I2510 Atherosclerotic heart disease of native coronary artery without angina pectoris: Secondary | ICD-10-CM | POA: Diagnosis not present

## 2022-12-25 DIAGNOSIS — I890 Lymphedema, not elsewhere classified: Secondary | ICD-10-CM | POA: Insufficient documentation

## 2022-12-25 HISTORY — PX: AMPUTATION: SHX166

## 2022-12-25 LAB — GLUCOSE, CAPILLARY
Glucose-Capillary: 359 mg/dL — ABNORMAL HIGH (ref 70–99)
Glucose-Capillary: 270 mg/dL — ABNORMAL HIGH (ref 70–99)
Glucose-Capillary: 296 mg/dL — ABNORMAL HIGH (ref 70–99)
Glucose-Capillary: 331 mg/dL — ABNORMAL HIGH (ref 70–99)

## 2022-12-25 SURGERY — AMPUTATION, FOOT, PARTIAL
Anesthesia: General | Site: Foot | Laterality: Left

## 2022-12-25 MED ORDER — PHENYLEPHRINE 80 MCG/ML (10ML) SYRINGE FOR IV PUSH (FOR BLOOD PRESSURE SUPPORT)
PREFILLED_SYRINGE | INTRAVENOUS | Status: DC | PRN
  Administered 2022-12-25 (×2): 80 ug via INTRAVENOUS
  Administered 2022-12-25: 160 ug via INTRAVENOUS

## 2022-12-25 MED ORDER — METOPROLOL TARTRATE 12.5 MG HALF TABLET
ORAL_TABLET | ORAL | Status: AC
  Administered 2022-12-25: 25 mg via ORAL
  Filled 2022-12-25: qty 2

## 2022-12-25 MED ORDER — PROPOFOL 10 MG/ML IV BOLUS
INTRAVENOUS | Status: DC | PRN
  Administered 2022-12-25: 150 mg via INTRAVENOUS

## 2022-12-25 MED ORDER — LIDOCAINE 2% (20 MG/ML) 5 ML SYRINGE
INTRAMUSCULAR | Status: DC | PRN
  Administered 2022-12-25: 100 mg via INTRAVENOUS

## 2022-12-25 MED ORDER — FENTANYL CITRATE (PF) 250 MCG/5ML IJ SOLN
INTRAMUSCULAR | Status: AC
  Filled 2022-12-25: qty 5

## 2022-12-25 MED ORDER — INSULIN ASPART 100 UNIT/ML IJ SOLN
INTRAMUSCULAR | Status: AC
  Administered 2022-12-25: 12 [IU] via SUBCUTANEOUS
  Filled 2022-12-25: qty 1

## 2022-12-25 MED ORDER — OXYCODONE-ACETAMINOPHEN 5-325 MG PO TABS: 1.0000 | ORAL_TABLET | ORAL | 0 refills | Status: DC | PRN

## 2022-12-25 MED ORDER — SUCCINYLCHOLINE CHLORIDE 200 MG/10ML IV SOSY
PREFILLED_SYRINGE | INTRAVENOUS | Status: DC | PRN
  Administered 2022-12-25: 80 mg via INTRAVENOUS

## 2022-12-25 MED ORDER — INSULIN ASPART 100 UNIT/ML IJ SOLN: 0.0000 [IU] | INTRAMUSCULAR | Status: DC | PRN

## 2022-12-25 MED ORDER — KETAMINE HCL 50 MG/5ML IJ SOSY
PREFILLED_SYRINGE | INTRAMUSCULAR | Status: AC
  Filled 2022-12-25: qty 5

## 2022-12-25 MED ORDER — KETAMINE HCL 10 MG/ML IJ SOLN
INTRAMUSCULAR | Status: DC | PRN
  Administered 2022-12-25: 10 mg via INTRAVENOUS

## 2022-12-25 MED ORDER — MIDAZOLAM HCL 2 MG/2ML IJ SOLN
INTRAMUSCULAR | Status: AC
  Filled 2022-12-25: qty 2

## 2022-12-25 MED ORDER — CHLORHEXIDINE GLUCONATE 0.12 % MT SOLN: 15.0000 mL | Freq: Once | OROMUCOSAL | Status: AC

## 2022-12-25 MED ORDER — OXYCODONE HCL 5 MG/5ML PO SOLN: 5.0000 mg | Freq: Once | ORAL | Status: DC | PRN

## 2022-12-25 MED ORDER — FENTANYL CITRATE (PF) 100 MCG/2ML IJ SOLN: 25.0000 ug | INTRAMUSCULAR | Status: DC | PRN

## 2022-12-25 MED ORDER — CHLORHEXIDINE GLUCONATE 0.12 % MT SOLN
OROMUCOSAL | Status: AC
  Administered 2022-12-25: 15 mL via OROMUCOSAL
  Filled 2022-12-25: qty 15

## 2022-12-25 MED ORDER — PROPOFOL 10 MG/ML IV BOLUS
INTRAVENOUS | Status: AC
  Filled 2022-12-25: qty 20

## 2022-12-25 MED ORDER — 0.9 % SODIUM CHLORIDE (POUR BTL) OPTIME
TOPICAL | Status: DC | PRN
  Administered 2022-12-25: 1000 mL

## 2022-12-25 MED ORDER — OXYCODONE HCL 5 MG PO TABS: 5.0000 mg | ORAL_TABLET | Freq: Once | ORAL | Status: DC | PRN

## 2022-12-25 MED ORDER — AMISULPRIDE (ANTIEMETIC) 5 MG/2ML IV SOLN: 10.0000 mg | Freq: Once | INTRAVENOUS | Status: DC | PRN

## 2022-12-25 MED ORDER — EPHEDRINE SULFATE (PRESSORS) 50 MG/ML IJ SOLN
INTRAMUSCULAR | Status: DC | PRN
  Administered 2022-12-25: 5 mg via INTRAVENOUS

## 2022-12-25 MED ORDER — CEFAZOLIN SODIUM-DEXTROSE 2-4 GM/100ML-% IV SOLN
INTRAVENOUS | Status: AC
  Filled 2022-12-25: qty 100

## 2022-12-25 MED ORDER — LACTATED RINGERS IV SOLN: INTRAVENOUS | Status: DC

## 2022-12-25 MED ORDER — METOPROLOL TARTRATE 25 MG PO TABS: 25.0000 mg | ORAL_TABLET | Freq: Once | ORAL | Status: AC

## 2022-12-25 MED ORDER — ONDANSETRON HCL 4 MG/2ML IJ SOLN: 4.0000 mg | Freq: Once | INTRAMUSCULAR | Status: DC | PRN

## 2022-12-25 MED ORDER — ONDANSETRON HCL 4 MG/2ML IJ SOLN
INTRAMUSCULAR | Status: DC | PRN
  Administered 2022-12-25: 4 mg via INTRAVENOUS

## 2022-12-25 MED ORDER — INSULIN ASPART 100 UNIT/ML IJ SOLN
6.0000 [IU] | Freq: Once | INTRAMUSCULAR | Status: AC
  Administered 2022-12-25: 6 [IU] via SUBCUTANEOUS

## 2022-12-25 MED ORDER — FENTANYL CITRATE (PF) 250 MCG/5ML IJ SOLN
INTRAMUSCULAR | Status: DC | PRN
  Administered 2022-12-25 (×2): 50 ug via INTRAVENOUS

## 2022-12-25 MED ORDER — CEFAZOLIN SODIUM-DEXTROSE 2-4 GM/100ML-% IV SOLN
2.0000 g | INTRAVENOUS | Status: AC
  Administered 2022-12-25: 2 g via INTRAVENOUS
  Filled 2022-12-25: qty 100

## 2022-12-25 MED ORDER — ACETAMINOPHEN 10 MG/ML IV SOLN: 1000.0000 mg | Freq: Once | INTRAVENOUS | Status: DC | PRN

## 2022-12-25 MED ORDER — ORAL CARE MOUTH RINSE: 15.0000 mL | Freq: Once | OROMUCOSAL | Status: AC

## 2022-12-25 MED ORDER — MIDAZOLAM HCL 2 MG/2ML IJ SOLN
INTRAMUSCULAR | Status: DC | PRN
  Administered 2022-12-25: 2 mg via INTRAVENOUS

## 2022-12-25 MED ORDER — LIDOCAINE 2% (20 MG/ML) 5 ML SYRINGE
INTRAMUSCULAR | Status: AC
  Filled 2022-12-25: qty 10

## 2022-12-25 SURGICAL SUPPLY — 37 items
APL SKNCLS NONHYPOALLERGENIC (GAUZE/BANDAGES/DRESSINGS)
APL SKNCLS STERI-STRIP NONHPOA (GAUZE/BANDAGES/DRESSINGS)
BAG COUNTER SPONGE SURGICOUNT (BAG) ×1 IMPLANT
BAG SPNG CNTER NS LX DISP (BAG) ×1
BENZOIN TINCTURE PRP APPL 2/3 (GAUZE/BANDAGES/DRESSINGS) ×1 IMPLANT
BLADE SAW SGTL HD 18.5X60.5X1. (BLADE) ×1 IMPLANT
BLADE SURG 21 STRL SS (BLADE) ×1 IMPLANT
BNDG COHESIVE 4X5 TAN STRL (GAUZE/BANDAGES/DRESSINGS) IMPLANT
BNDG COHESIVE 6X5 TAN NS LF (GAUZE/BANDAGES/DRESSINGS) IMPLANT
BNDG GAUZE DERMACEA FLUFF 4 (GAUZE/BANDAGES/DRESSINGS) IMPLANT
BNDG GZE DERMACEA 4 6PLY (GAUZE/BANDAGES/DRESSINGS) ×1
COVER SURGICAL LIGHT HANDLE (MISCELLANEOUS) ×1 IMPLANT
DRAPE INCISE IOBAN 66X45 STRL (DRAPES) ×1 IMPLANT
DRAPE U-SHAPE 47X51 STRL (DRAPES) ×1 IMPLANT
DRSG ADAPTIC 3X8 NADH LF (GAUZE/BANDAGES/DRESSINGS) IMPLANT
DURAPREP 26ML APPLICATOR (WOUND CARE) ×1 IMPLANT
ELECT REM PT RETURN 9FT ADLT (ELECTROSURGICAL) ×1
ELECTRODE REM PT RTRN 9FT ADLT (ELECTROSURGICAL) ×1 IMPLANT
GAUZE PAD ABD 8X10 STRL (GAUZE/BANDAGES/DRESSINGS) IMPLANT
GAUZE SPONGE 4X4 12PLY STRL (GAUZE/BANDAGES/DRESSINGS) IMPLANT
GLOVE BIOGEL PI IND STRL 9 (GLOVE) ×1 IMPLANT
GLOVE SURG ORTHO 9.0 STRL STRW (GLOVE) ×1 IMPLANT
GOWN STRL REUS W/ TWL XL LVL3 (GOWN DISPOSABLE) ×3 IMPLANT
GOWN STRL REUS W/TWL XL LVL3 (GOWN DISPOSABLE) ×3
GRAFT SKIN WND MICRO 38 (Tissue) IMPLANT
KIT BASIN OR (CUSTOM PROCEDURE TRAY) ×1 IMPLANT
KIT TURNOVER KIT B (KITS) ×1 IMPLANT
NS IRRIG 1000ML POUR BTL (IV SOLUTION) ×1 IMPLANT
PACK ORTHO EXTREMITY (CUSTOM PROCEDURE TRAY) ×1 IMPLANT
PAD ARMBOARD 7.5X6 YLW CONV (MISCELLANEOUS) ×2 IMPLANT
SPONGE T-LAP 18X18 ~~LOC~~+RFID (SPONGE) IMPLANT
SUT ETHILON 2 0 PSLX (SUTURE) ×2 IMPLANT
TOWEL GREEN STERILE (TOWEL DISPOSABLE) ×1 IMPLANT
TOWEL GREEN STERILE FF (TOWEL DISPOSABLE) ×1 IMPLANT
TUBE CONNECTING 12X1/4 (SUCTIONS) ×1 IMPLANT
WATER STERILE IRR 1000ML POUR (IV SOLUTION) ×1 IMPLANT
YANKAUER SUCT BULB TIP NO VENT (SUCTIONS) ×1 IMPLANT

## 2022-12-25 NOTE — Telephone Encounter (Signed)
SW wife, advised to make sure he is OFF his foot and elevating. He had this same issue with bleeding after his toe amputations due to already walking on the foot the same day as surgery. If they have any issues this evening or over night, please go to ER for eval. She will make sure he is elevating and staying off it.

## 2022-12-25 NOTE — H&P (Signed)
Adam Ray is an 67 y.o. male.   Chief Complaint: Dehiscence left great toe and second toe amputation.  With maggot infestation. HPI: Patient is 3-month status post left great toe and second toe amputation.  He has a transtibial amputation on the right.  Patient has maggot infestation of the wound.  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   Status post transmetatarsal amputation of foot, 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/2018   STEMI involving left circumflex coronary artery (HCC) 07/12/2018   Coronary artery disease   Subacute osteomyelitis, right ankle and foot (HCC) 01/29/2016   Testicular mass 04/18/2016    Past Surgical History:  Procedure Laterality Date   AMPUTATION Right 01/31/2016   Procedure: Right 2nd Toe Amputation;  Surgeon: [name], MD;  Location: [facility] OR;  Service: Orthopedics;  Laterality: Right;   AMPUTATION Right 07/08/2018   Procedure: RIGHT TRANSMETATARSAL AMPUTATION;  Surgeon: [name], MD;  Location: [facility] OR;  Service: Orthopedics;  Laterality: Right;   AMPUTATION Right 01/05/2020   Procedure: RIGHT BELOW KNEE AMPUTATION;  Surgeon: [name], MD;  Location: [facility] OR;  Service: Orthopedics;  Laterality: Right;   AMPUTATION Right 02/23/2020   Procedure: RIGHT BELOW KNEE AMPUTATION REVISION;  Surgeon: [name], MD;  Location: [facility] OR;  Service: Orthopedics;  Laterality: Right;   AMPUTATION Left 10/11/2022   Procedure: LEFT GREAT TOE AMPUTATION AND  LEFT 2ND TOE AMPUTATION;  Surgeon: [name], MD;  Location: [facility] OR;  Service: Orthopedics;  Laterality: Left;   CATARACT EXTRACTION     right eye   COLONOSCOPY     CORONARY STENT INTERVENTION N/A 07/12/2018   Procedure: CORONARY STENT INTERVENTION;  Surgeon: Lennette Bihari, MD;  Location: [facility] INVASIVE CV LAB;  Service: Cardiovascular;  Laterality: N/A;   CORONARY/GRAFT ACUTE MI REVASCULARIZATION N/A 07/12/2018   Procedure: Coronary/Graft Acute MI Revascularization;  Surgeon: Lennette Bihari, MD;  Location: [facility] INVASIVE CV LAB;  Service: Cardiovascular;  Laterality: N/A;   I & D EXTREMITY  04/11/2012   Procedure: IRRIGATION AND DEBRIDEMENT EXTREMITY;  Surgeon: Toni Arthurs, MD;  Location: [facility] OR;  Service: Orthopedics;  Laterality: Right;   LEFT HEART CATH AND CORONARY ANGIOGRAPHY N/A 07/12/2018   Procedure: LEFT HEART CATH AND CORONARY ANGIOGRAPHY;  Surgeon: Lennette Bihari, MD;  Location: [facility] INVASIVE CV LAB;  Service: Cardiovascular;  Laterality: N/A;   Surgery left great toe     Tear ducts bilateral eyes     TRANSMETATARSAL AMPUTATION Right 07/08/2018    Family History  Problem Relation Age of Onset   Diabetes Mother    Stroke Mother    Heart failure Father    Heart attack Father    Diabetes Sister        multiple siblings   Diabetes Brother  muliple siblings   Heart disease Brother    Colon cancer Neg Hx    Esophageal cancer Neg Hx    Rectal cancer Neg Hx    Stomach cancer Neg Hx    Social History:  reports that he quit smoking about 3 years ago. His smoking use included cigars and cigarettes. He started smoking about 59 years ago. He has a 14.40 pack-year smoking history. He has quit using smokeless tobacco. He reports that he does not currently use alcohol. He reports that he does not use drugs.  Allergies:  Allergies  Allergen Reactions   Codeine Other (See Comments)    Heart attack.   Morphine Other (See Comments)    Patient preference   Propofol Other (See  Comments)    Patient preference    No medications prior to admission.    No results found. However, due to the size of the patient record, not all encounters were searched. Please check Results Review for a complete set of results. No results found.  Review of Systems  All other systems reviewed and are negative.   Height 5\' 10"  (1.778 m), weight 77.1 kg. Physical Exam  Patient is alert, oriented, no adenopathy, well-dressed, normal affect, normal respiratory effort. Examination patient does have venous and lymphatic swelling of the left lower extremity.  He has a palpable dorsalis pedis pulse.  There is wound dehiscence with infestation with maggots of the wound.  There is no ascending cellulitis no purulent drainage.      Assessment/Plan 1. 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 Peripheral vascular disease (HCC)   3. Maggot infestation   4. Postoperative wound dehiscence, initial encounter       Plan: Will plan for transmetatarsal amputation on Wednesday.  Risk and benefits were discussed including risk of the wound not healing need for additional surgery.  Patient and his wife state they understand wish to proceed at this time.  [name], MD 12/25/2022, 6:53 AM

## 2022-12-25 NOTE — Anesthesia Procedure Notes (Signed)
Procedure Name: Intubation Date/Time: 12/25/2022 10:10 AM  Performed by: [name], CRNAPre-anesthesia Checklist: Patient identified, Emergency Drugs available, Suction available and Patient being monitored Patient Re-evaluated:Patient Re-evaluated prior to induction Oxygen Delivery Method: Circle system utilized Preoxygenation: Pre-oxygenation with 100% oxygen Induction Type: IV induction Ventilation: Mask ventilation without difficulty Laryngoscope Size: Mac and 4 Grade View: Grade I Tube type: Oral Number of attempts: 1 Airway Equipment and Method: Stylet and Oral airway Placement Confirmation: ETT inserted through vocal cords under direct vision, positive ETCO2 and breath sounds checked- equal and bilateral Secured at: 22 cm Tube secured with: Tape Dental Injury: Teeth and Oropharynx as per pre-operative assessment

## 2022-12-25 NOTE — Transfer of Care (Signed)
Immediate Anesthesia Transfer of Care Note  Patient: Adam Ray  Procedure(s) Performed: LEFT TRANSMETATARSAL AMPUTATION (Left: Foot)  Patient Location: PACU  Anesthesia Type:General  Level of Consciousness: awake  Airway & Oxygen Therapy: Patient Spontanous Breathing  Post-op Assessment: Report given to RN and Post -op Vital signs reviewed and stable  Post vital signs: Reviewed and stable  Last Vitals:  Vitals Value Taken Time  BP 97/61 12/25/22 1058  Temp    Pulse 82 12/25/22 1059  Resp 20 12/25/22 1059  SpO2 98 % 12/25/22 1059  Vitals shown include unvalidated device data.  Last Pain:  Vitals:   12/25/22 0943  TempSrc:   PainSc: 0-No pain         Complications: No notable events documented.

## 2022-12-25 NOTE — Progress Notes (Signed)
Orthopedic Tech Progress Note Patient Details:  Adam Ray 1956-01-17 [id number]  PACU RN called requesting a POST OP SHOE  Ortho Devices Type of Ortho Device: Postop shoe/boot Ortho Device/Splint Location: LLE Ortho Device/Splint Interventions: Ordered, Application, Adjustment   Post Interventions Patient Tolerated: Well Instructions Provided: Care of device  [name] 12/25/2022, 12:06 PM

## 2022-12-25 NOTE — Anesthesia Postprocedure Evaluation (Signed)
Anesthesia Post Note  Patient: Adam Ray  Procedure(s) Performed: LEFT TRANSMETATARSAL AMPUTATION (Left: Foot)     Patient location during evaluation: PACU Anesthesia Type: General Level of consciousness: awake and alert Pain management: pain level controlled Vital Signs Assessment: post-procedure vital signs reviewed and stable Respiratory status: spontaneous breathing, nonlabored ventilation, respiratory function stable and patient connected to nasal cannula oxygen Cardiovascular status: blood pressure returned to baseline and stable Postop Assessment: no apparent nausea or vomiting Anesthetic complications: no   No notable events documented.  Last Vitals:  Vitals:   12/25/22 1130 12/25/22 1145  BP: 107/63 (!) 106/59  Pulse: 79 77  Resp: (!) 21 (!) 22  Temp: 37 C   SpO2: 97% 96%    Last Pain:  Vitals:   12/25/22 1130  TempSrc:   PainSc: 0-No pain                 Trevor Iha

## 2022-12-25 NOTE — Telephone Encounter (Signed)
Patients wife called in Stating her husbands foot is still bleeding. Looks like he had surgery today? Wants a call back ASAP  [phone]

## 2022-12-25 NOTE — Interval H&P Note (Signed)
History and Physical Interval Note:  12/25/2022 10:08 AM  Adam Ray  has presented today for surgery, with the diagnosis of Gangrene Left Foot.  The various methods of treatment have been discussed with the patient and family. After consideration of risks, benefits and other options for treatment, the patient has consented to  Procedure(s): LEFT TRANSMETATARSAL AMPUTATION (Left) as a surgical intervention.  The patient's history has been reviewed, patient examined, no change in status, stable for surgery.  I have reviewed the patient's chart and labs.  Questions were answered to the patient's satisfaction.     [name]

## 2022-12-25 NOTE — Op Note (Signed)
12/25/2022  10:57 AM  PATIENT:  Adam Ray    PRE-OPERATIVE DIAGNOSIS:  Gangrene Left Foot  POST-OPERATIVE DIAGNOSIS:  Same  PROCEDURE:  LEFT TRANSMETATARSAL AMPUTATION  SURGEON:  [name], MD  PHYSICIAN ASSISTANT:None ANESTHESIA:   General  PREOPERATIVE INDICATIONS:  Adam Ray is a  67 y.o. male with a diagnosis of Gangrene Left Foot who failed conservative measures and elected for surgical management.    The risks benefits and alternatives were discussed with the patient preoperatively including but not limited to the risks of infection, bleeding, nerve injury, cardiopulmonary complications, the need for revision surgery, among others, and the patient was willing to proceed.  OPERATIVE IMPLANTS:   Implant Name Type Inv. Item Serial No. Manufacturer Lot No. LRB No. Used Action  GRAFT SKIN WND MICRO 38 - [id number] Tissue GRAFT SKIN WND MICRO 38  [employer] [id number] Left 1 Implanted    @ENCIMAGES @  OPERATIVE FINDINGS: Tissue margins were clear arteries were calcified.  OPERATIVE PROCEDURE: Patient was brought the operating room after undergoing a regional anesthetic.  After adequate levels anesthesia obtained patient's left lower extremity was prepped using DuraPrep draped into a sterile field a timeout was called.  A fishmouth incision was made proximal to the wound dehiscence.  This was carried sharply down to bone and a transmetatarsal amputation was performed with an oscillating saw.  Electrocautery was used for hemostasis the wound was irrigated with normal saline.  38 cm of Kerecis micro graft was applied wound was closed using 2-0 nylon a sterile dressing was applied including compression up to the tibial tubercle patient was taken the PACU in stable condition.   DISCHARGE PLANNING:  Antibiotic duration: Preoperative antibiotics  Weightbearing: Touchdown weightbearing on the left  Pain medication: Prescription for Percocet  Dressing care/ Wound VAC:  Follow-up in the office to change the dressing  Ambulatory devices: Walker  Discharge to: Home.  Follow-up: In the office 1 week post operative.

## 2022-12-26 ENCOUNTER — Ambulatory Visit: Payer: No Typology Code available for payment source | Admitting: Orthopedic Surgery

## 2022-12-26 ENCOUNTER — Encounter (HOSPITAL_COMMUNITY): Payer: Self-pay | Admitting: Orthopedic Surgery

## 2022-12-26 DIAGNOSIS — Z89432 Acquired absence of left foot: Secondary | ICD-10-CM

## 2022-12-26 NOTE — Progress Notes (Signed)
Office Visit Note   Patient: Adam Ray           Date of Birth: [birth year]           MRN: [id number] Visit Date: 12/26/2022              Requested by: Latrelle Dodrill, MD [address] [place],  [place] [address] PCP: Latrelle Dodrill, MD  Chief Complaint  Patient presents with   Left Foot - Routine Post Op    12/25/22 right transmet amputation, bleeding complication      HPI: Patient is a 67 year old gentleman who is postoperative day 1 transmetatarsal amputation on the left.  Patient was ambulating on his foot immediately postoperatively and started bleeding through his dressing.  Assessment & Plan: Visit Diagnoses:  1. History of transmetatarsal amputation of left foot (HCC)     Plan: Patient will have the dressing changed daily at home recommended elevation and nonweightbearing.  Follow-Up Instructions: Return in about 1 week (around 01/02/2023).   Ortho Exam  Patient is alert, oriented, no adenopathy, well-dressed, normal affect, normal respiratory effort. Examination the dressing was bloody there is no active bleeding at this time.  4 x 4's and Ace wrap was applied from the knee through the dressing.  Imaging: No results found. No images are attached to the encounter.  Labs: Lab Results  Component Value Date   HGBA1C 6.6 11/18/2022   HGBA1C 7.9 (A) 07/05/2022   HGBA1C 9.4 (H) 01/17/2022   ESRSEDRATE 54 (H) 01/01/2020   ESRSEDRATE 8 01/29/2016   ESRSEDRATE 9 04/11/2012   CRP 2.6 (H) 01/29/2016   CRP 0.5 02/28/2014   REPTSTATUS 01/18/2020 FINAL 01/17/2020   REPTSTATUS 01/22/2020 FINAL 01/17/2020   REPTSTATUS 01/22/2020 FINAL 01/17/2020   GRAMSTAIN  04/11/2012    NO WBC SEEN RARE SQUAMOUS EPITHELIAL CELLS PRESENT MODERATE GRAM POSITIVE COCCI IN PAIRS RARE GRAM NEGATIVE RODS   GRAMSTAIN  04/11/2012    NO WBC SEEN RARE SQUAMOUS EPITHELIAL CELLS PRESENT MODERATE GRAM POSITIVE COCCI IN PAIRS RARE GRAM NEGATIVE RODS   CULT (A) 01/17/2020     <10,000 COLONIES/mL INSIGNIFICANT GROWTH Performed at [facility], [address] [place], [place] [address]    CULT  01/17/2020    NO GROWTH 5 DAYS Performed at [facility], [address], [place], [place] [address]    CULT  01/17/2020    NO GROWTH 5 DAYS Performed at [facility], [address], [place], [place] [address]    LABORGA NO GROWTH 2 DAYS 07/19/2014     Lab Results  Component Value Date   ALBUMIN 4.1 12/16/2022   ALBUMIN 4.5 01/17/2022   ALBUMIN 4.1 12/06/2020    No results found for: "MG" No results found for: "VD25OH"  No results found for: "PREALBUMIN"    Latest Ref Rng & Units 12/16/2022    5:13 PM 12/16/2022    5:12 PM 10/11/2022    8:09 AM  CBC EXTENDED  WBC 3.4 - 10.8 x10E3/uL 2.8  2.8  3.1   RBC 4.14 - 5.80 x10E6/uL 4.17  4.15  4.17   Hemoglobin 13.0 - 17.7 g/dL 95.2  84.1  32.4   HCT 37.5 - 51.0 % 34.6  34.6  36.6   Platelets 150 - 450 x10E3/uL 97  91  117   NEUT# 1.4 - 7.0 x10E3/uL 1.0  1.0    Lymph# 0.7 - 3.1 x10E3/uL 1.1  1.1  There is no height or weight on file to calculate BMI.  Orders:  No orders of the defined types were placed in this encounter.  No orders of the defined types were placed in this encounter.    Procedures: No procedures performed  Clinical Data: No additional findings.  ROS:  All other systems negative, except as noted in the HPI. Review of Systems  Objective: Vital Signs: There were no vitals taken for this visit.  Specialty Comments:  No specialty comments available.  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   Atrophy  of tongue papillae 10/02/2022   Atherosclerosis of coronary artery without angina pectoris 10/02/2022   Age-related nuclear cataract of left eye 10/02/2022   Manson Passey hairy tongue 10/02/2022   Proliferative diabetic retinopathy of right eye associated with type 2 diabetes mellitus (HCC) 10/02/2022   Tongue discoloration 02/14/2022   Hematuria 08/16/2021   Cervical spondylosis without myelopathy 12/14/2020   Pain in joint of left shoulder 12/14/2020   Urinary incontinence 08/10/2020   Increased urinary frequency 05/09/2020   Nocturnal leg cramps 10/28/2019   Hyperlipidemia associated with type 2 diabetes mellitus (HCC) 05/26/2019   Limited mobility 05/12/2018   Anxiety state 06/02/2017   Leg swelling 01/29/2017   Idiopathic chronic venous hypertension of both lower extremities with inflammation 09/24/2016   Diabetic polyneuropathy associated with type 2 diabetes mellitus (HCC)    Type 2 diabetes mellitus with neurologic complication, with long-term current use of insulin (HCC) 12/08/2015   Left shoulder pain 08/10/2015   Pulmonary nodule 02/01/2015   Depression 12/09/2013   Warts, genital 08/20/2013   Diabetic retinopathy (HCC) 01/28/2013   Sleep apnea 09/06/2010   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   Hypertension associated with diabetes (HCC) 11/09/2008   COPD, mild (HCC) 10/06/2006   SICKLE-CELL TRAIT 04/26/2006   ERECTILE DYSFUNCTION 04/26/2006   Tobacco use 04/26/2006   Peripheral arterial disease (HCC) 04/26/2006   Allergic rhinitis 04/26/2006   Past Medical History:  Diagnosis Date   Allergy    Arthritis    Bronchitis    Cataract    CHF (congestive heart failure) (HCC)    Chronic kidney disease    Claudication (HCC)    right foot ray resection   Colon polyps    hyperplastic   COPD (chronic obstructive pulmonary disease) (HCC)    Coronary artery disease    Diabetes mellitus    type II   Genital warts    Gout    Hyperlipidemia    Hypertension    Myocardial  infarction (HCC)    Osteomyelitis of third toe of right foot (HCC)    Pneumonia    Status post amputation of toe of right foot (HCC) 09/24/2016   Status post transmetatarsal amputation of foot, 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/2018   STEMI involving left circumflex coronary artery (HCC) 07/12/2018   Coronary artery disease   Subacute osteomyelitis, right ankle and foot (HCC) 01/29/2016   Testicular mass 04/18/2016    Family History  Problem Relation Age of Onset   Diabetes Mother    Stroke Mother    Heart failure Father    Heart attack Father    Diabetes Sister  multiple siblings   Diabetes Brother        muliple siblings   Heart disease Brother    Colon cancer Neg Hx    Esophageal cancer Neg Hx    Rectal cancer Neg Hx    Stomach cancer Neg Hx     Past Surgical History:  Procedure Laterality Date   AMPUTATION Right 01/31/2016   Procedure: Right 2nd Toe Amputation;  Surgeon: [name], MD;  Location: [facility] OR;  Service: Orthopedics;  Laterality: Right;   AMPUTATION Right 07/08/2018   Procedure: RIGHT TRANSMETATARSAL AMPUTATION;  Surgeon: [name], MD;  Location: [facility] OR;  Service: Orthopedics;  Laterality: Right;   AMPUTATION Right 01/05/2020   Procedure: RIGHT BELOW KNEE AMPUTATION;  Surgeon: [name], MD;  Location: [facility] OR;  Service: Orthopedics;  Laterality: Right;   AMPUTATION Right 02/23/2020   Procedure: RIGHT BELOW KNEE AMPUTATION REVISION;  Surgeon: [name], MD;  Location: [facility] OR;  Service: Orthopedics;  Laterality: Right;   AMPUTATION Left 10/11/2022   Procedure: LEFT GREAT TOE AMPUTATION AND LEFT 2ND TOE AMPUTATION;  Surgeon: [name], MD;  Location: [facility] OR;  Service: Orthopedics;  Laterality: Left;   AMPUTATION Left 12/25/2022   Procedure: LEFT TRANSMETATARSAL AMPUTATION;  Surgeon: [name], MD;  Location: [facility] OR;  Service: Orthopedics;  Laterality: Left;   CATARACT EXTRACTION     right eye   COLONOSCOPY     CORONARY STENT INTERVENTION N/A 07/12/2018    Procedure: CORONARY STENT INTERVENTION;  Surgeon: Lennette Bihari, MD;  Location: [facility] LAB;  Service: Cardiovascular;  Laterality: N/A;   CORONARY/GRAFT ACUTE MI REVASCULARIZATION N/A 07/12/2018   Procedure: Coronary/Graft Acute MI Revascularization;  Surgeon: Lennette Bihari, MD;  Location: [facility];  Service: Cardiovascular;  Laterality: N/A;   I & D EXTREMITY  04/11/2012   Procedure: IRRIGATION AND DEBRIDEMENT EXTREMITY;  Surgeon: Toni Arthurs, MD;  Location: [facility] OR;  Service: Orthopedics;  Laterality: Right;   LEFT HEART CATH AND CORONARY ANGIOGRAPHY N/A 07/12/2018   Procedure: LEFT HEART CATH AND CORONARY ANGIOGRAPHY;  Surgeon: Lennette Bihari, MD;  Location: [facility];  Service: Cardiovascular;  Laterality: N/A;   Surgery left great toe     Tear ducts bilateral eyes     TRANSMETATARSAL AMPUTATION Right 07/08/2018   Social History   Occupational History   Occupation: disabled  Tobacco Use   Smoking status: Former    Packs/day: 0.30    Years: 48.00    Additional pack years: 0.00    Total pack years: 14.40    Types: Cigars, Cigarettes    Start date: 07/02/1963    Quit date: 06/01/2019    Years since quitting: 3.5   Smokeless tobacco: Former  Building services engineer Use: Former  Substance and Sexual Activity   Alcohol use: Not Currently   Drug use: No   Sexual activity: Not on file

## 2022-12-27 ENCOUNTER — Telehealth: Payer: Self-pay | Admitting: Student

## 2022-12-27 ENCOUNTER — Other Ambulatory Visit: Payer: Self-pay

## 2022-12-27 ENCOUNTER — Emergency Department (HOSPITAL_COMMUNITY)
Admission: EM | Admit: 2022-12-27 | Discharge: 2022-12-28 | Disposition: A | Discharge status: Home / Self Care | Payer: 59 | Attending: Emergency Medicine | Admitting: Emergency Medicine

## 2022-12-27 ENCOUNTER — Encounter (HOSPITAL_COMMUNITY): Payer: Self-pay

## 2022-12-27 ENCOUNTER — Telehealth: Payer: Self-pay | Admitting: Radiology

## 2022-12-27 DIAGNOSIS — Z79899 Other long term (current) drug therapy: Secondary | ICD-10-CM | POA: Insufficient documentation

## 2022-12-27 DIAGNOSIS — I2510 Atherosclerotic heart disease of native coronary artery without angina pectoris: Secondary | ICD-10-CM | POA: Insufficient documentation

## 2022-12-27 DIAGNOSIS — E1165 Type 2 diabetes mellitus with hyperglycemia: Secondary | ICD-10-CM | POA: Insufficient documentation

## 2022-12-27 DIAGNOSIS — J449 Chronic obstructive pulmonary disease, unspecified: Secondary | ICD-10-CM | POA: Diagnosis not present

## 2022-12-27 DIAGNOSIS — R739 Hyperglycemia, unspecified: Secondary | ICD-10-CM | POA: Diagnosis not present

## 2022-12-27 DIAGNOSIS — Z91040 Latex allergy status: Secondary | ICD-10-CM | POA: Diagnosis not present

## 2022-12-27 DIAGNOSIS — Z743 Need for continuous supervision: Secondary | ICD-10-CM | POA: Diagnosis not present

## 2022-12-27 DIAGNOSIS — Z794 Long term (current) use of insulin: Secondary | ICD-10-CM | POA: Insufficient documentation

## 2022-12-27 DIAGNOSIS — Z7982 Long term (current) use of aspirin: Secondary | ICD-10-CM | POA: Insufficient documentation

## 2022-12-27 DIAGNOSIS — R0682 Tachypnea, not elsewhere classified: Secondary | ICD-10-CM | POA: Diagnosis not present

## 2022-12-27 DIAGNOSIS — I10 Essential (primary) hypertension: Secondary | ICD-10-CM | POA: Insufficient documentation

## 2022-12-27 DIAGNOSIS — D709 Neutropenia, unspecified: Secondary | ICD-10-CM | POA: Insufficient documentation

## 2022-12-27 DIAGNOSIS — Z7984 Long term (current) use of oral hypoglycemic drugs: Secondary | ICD-10-CM | POA: Insufficient documentation

## 2022-12-27 LAB — CBC
HCT: 25.4 % — ABNORMAL LOW (ref 39.0–52.0)
Hemoglobin: 8.3 g/dL — ABNORMAL LOW (ref 13.0–17.0)
MCH: 27.2 pg (ref 26.0–34.0)
MCHC: 32.7 g/dL (ref 30.0–36.0)
MCV: 83.3 fL (ref 80.0–100.0)
Platelets: 104 10*3/uL — ABNORMAL LOW (ref 150–400)
RBC: 3.05 MIL/uL — ABNORMAL LOW (ref 4.22–5.81)
RDW: 13.7 % (ref 11.5–15.5)
WBC: 2.9 10*3/uL — ABNORMAL LOW (ref 4.0–10.5)
nRBC: 0 % (ref 0.0–0.2)

## 2022-12-27 LAB — I-STAT VENOUS BLOOD GAS, ED
Acid-Base Excess: 0 mmol/L (ref 0.0–2.0)
Bicarbonate: 25.6 mmol/L (ref 20.0–28.0)
Calcium, Ion: 1.13 mmol/L — ABNORMAL LOW (ref 1.15–1.40)
HCT: 26 % — ABNORMAL LOW (ref 39.0–52.0)
Hemoglobin: 8.8 g/dL — ABNORMAL LOW (ref 13.0–17.0)
O2 Saturation: 85 %
Potassium: 3.6 mmol/L (ref 3.5–5.1)
Sodium: 134 mmol/L — ABNORMAL LOW (ref 135–145)
TCO2: 27 mmol/L (ref 22–32)
pCO2, Ven: 44.9 mmHg (ref 44–60)
pH, Ven: 7.365 (ref 7.25–7.43)
pO2, Ven: 51 mmHg — ABNORMAL HIGH (ref 32–45)

## 2022-12-27 LAB — CBG MONITORING, ED
Glucose-Capillary: 314 mg/dL — ABNORMAL HIGH (ref 70–99)
Glucose-Capillary: 343 mg/dL — ABNORMAL HIGH (ref 70–99)
Glucose-Capillary: 413 mg/dL — ABNORMAL HIGH (ref 70–99)

## 2022-12-27 LAB — BASIC METABOLIC PANEL
Anion gap: 7 (ref 5–15)
BUN: 16 mg/dL (ref 8–23)
CO2: 22 mmol/L (ref 22–32)
Calcium: 7.1 mg/dL — ABNORMAL LOW (ref 8.9–10.3)
Chloride: 103 mmol/L (ref 98–111)
Creatinine, Ser: 0.92 mg/dL (ref 0.61–1.24)
GFR, Estimated: >60 mL/min (ref >60)
Glucose, Bld: 354 mg/dL — ABNORMAL HIGH (ref 70–99)
Potassium: 3.5 mmol/L (ref 3.5–5.1)
Sodium: 132 mmol/L — ABNORMAL LOW (ref 135–145)

## 2022-12-27 LAB — MAGNESIUM: Magnesium: 1.5 mg/dL — ABNORMAL LOW (ref 1.7–2.4)

## 2022-12-27 LAB — BETA-HYDROXYBUTYRIC ACID: Beta-Hydroxybutyric Acid: 0.28 mmol/L — ABNORMAL HIGH (ref 0.05–0.27)

## 2022-12-27 MED ORDER — SODIUM CHLORIDE 0.9 % IV BOLUS
1000.0000 mL | Freq: Once | INTRAVENOUS | Status: AC
  Administered 2022-12-27: 1000 mL via INTRAVENOUS

## 2022-12-27 MED ORDER — INSULIN ASPART 100 UNIT/ML IJ SOLN
8.0000 [IU] | Freq: Once | INTRAMUSCULAR | Status: AC
  Administered 2022-12-27: 8 [IU] via SUBCUTANEOUS

## 2022-12-27 NOTE — Telephone Encounter (Signed)
Adam Ray called the triage line, asking if she could change Adam Ray's dressing, she thinks she was told by Dr. Lajoyce Corners that she could today but she can't remember. Please call her to advise @ [phone]

## 2022-12-27 NOTE — Telephone Encounter (Signed)
**  After Hours/ Emergency Line Call**  Received a page to call [phone]) - [phone].  Patient: Adam Ray  Caller:  Wife Confirmed name & DOB of patient with caller  Subjective:  Called because took CBG and it said high. Takes 30 units of lantus and metformin 500 mg daily. Patient and wife unsure about what meds he's on, noting that he is/used to be on novolog. They note that he took his meds today. Wife asking what can be done to bring down CBG.   Objective:  Observations: NAD    Assessment & Plan  Adam Ray is a 67 y.o. male with PMHx Dm2 who calls with the following complaints and concerns, hyperglycemia. Patient's CBG check at home measured "high" and has been high all day. Given high CBG, after taking meds today, concern for HHS vs DKA vs Hyperglycemia. Couple sounded confused on phone, about medications, and said they had some novolog. Provider does not feel comfortable managing hyperglycemia over the phone, and patient unable to get an appointment until next week. Will recommend sending patient to ED via EMS for hyperglycemia. Patient's wife note's transportation issue, encouraged them to call EMS. They agreed w/ plan.  Recommendations:  Go to ED via EMS, for evaluation and management of hyperglycemia   -- Will forward to PCP.  Bess Kinds, MD [facility], PGY-1

## 2022-12-27 NOTE — Telephone Encounter (Signed)
Called sw pt's wife to advise change dressing daily ( dry dressing) and keep appt on 01/01/2023. Voiced understanding and will call with any other questions.

## 2022-12-27 NOTE — ED Provider Notes (Signed)
[place] EMERGENCY DEPARTMENT AT [facility] Provider Note   CSN: [id number] Arrival date & time: 12/27/22  1914     History {Add pertinent medical, surgical, social history, OB history to HPI:1} Chief Complaint  Patient presents with   Hyperglycemia    Adam Ray is a 67 y.o. male who presents with concern for elevated CBGs at home today. States his novolog dosage was decreased one month ago, has been doing well until today. Did have left transmetatarsal amputation on 12/25/22 with Dr. Lajoyce Corners, complicated by post-operative bleeding with the patient ambulated on the foot immediately post-op.   Endorses polyuria, denies polydipsia, nausea, vomiting, confusion, or lightheadedness today.   I have reviewed his medical records; in addition to hx of insulin dependent DMT2, patient with hx of CAD, bicuspid aortic valve, HTN, COPD. NO anticoagulation per chart review and confirmed with patient.   Unrelated, but requesting a clean dressing on his surgical wound, as he accidentally urinated all over his dressing while at home.  HX of R transtibial amputation with Dr. Lajoyce Corners in the past.   HPI     Home Medications Prior to Admission medications   Medication Sig Start Date End Date Taking? Authorizing Provider  acetaminophen (TYLENOL) 500 MG tablet Take 1,500 mg by mouth every 6 (six) hours as needed for moderate pain, mild pain or headache.    [provider]  aspirin 81 MG chewable tablet Chew 81 mg by mouth daily.    [provider]  Cholecalciferol (VITAMIN D3) 50 MCG (2000 UT) TABS Take 2,000 Units by mouth daily.    [provider]  CINNAMON PO Take 1,200 mg by mouth 2 (two) times daily. [place]    [provider]  Continuous Glucose Receiver (FREESTYLE LIBRE 3 READER) DEVI 1 each by Does not apply route as directed. Use with sensor to monitor blood sugar 11/26/22   McDiarmid, Leighton Roach, MD  Continuous Glucose Sensor (FREESTYLE LIBRE 3 SENSOR) MISC  1 each by Does not apply route every 14 (fourteen) days. Place 1 sensor on the skin every 14 days. Use to check glucose continuously 11/26/22   McDiarmid, Leighton Roach, MD  diclofenac Sodium (VOLTAREN ARTHRITIS PAIN) 1 % GEL Apply 4 g topically 4 (four) times daily. Patient taking differently: Apply 4 g topically 4 (four) times daily as needed (joint pain). 09/11/22   Lockie Mola, MD  Dulaglutide (TRULICITY) 1.5 MG/0.5ML SOPN Inject 1.5 mg into the skin once a week. 02/14/22   Moses Manners, MD  famotidine (PEPCID) 20 MG tablet Take 20 mg by mouth daily as needed for heartburn or indigestion.    [provider]  furosemide (LASIX) 20 MG tablet Take 1 tablet (20 mg total) by mouth 2 (two) times daily. 07/26/22   Runell Gess, MD  insulin glargine (LANTUS SOLOSTAR) 100 UNIT/ML Solostar Pen Inject 30 Units into the skin daily. Patient taking differently: Inject 30 Units into the skin 2 (two) times daily. 11/26/22   McDiarmid, Leighton Roach, MD  metFORMIN (GLUCOPHAGE) 500 MG tablet TAKE 1 TABLET BY MOUTH 2 TIMES DAILY WITH A MEAL. Patient taking differently: Take 1,000 mg by mouth 2 (two) times daily with a meal. 06/29/21   Westley Chandler, MD  metoprolol tartrate (LOPRESSOR) 25 MG tablet Take 25 mg by mouth 2 (two) times daily.    [provider]  Multiple Vitamin (MULTIVITAMIN WITH MINERALS) TABS tablet Take 1 tablet by mouth daily.  iron    [provider]  oxyCODONE-acetaminophen (PERCOCET/ROXICET) 5-325 MG tablet Take 1 tablet by mouth every 4 (four) hours as needed. 12/25/22   [name], MD  rosuvastatin (CRESTOR) 40 MG tablet Take 1 tablet (40 mg total) by mouth daily at 6 PM. Patient taking differently: Take 20 mg by mouth daily at 6 PM. 05/15/20   Mirian Mo, MD  sildenafil (VIAGRA) 100 MG tablet Take 100 mg by mouth daily as needed for erectile dysfunction.    [provider]  tamsulosin (FLOMAX) 0.4 MG CAPS capsule Take 1 capsule (0.4 mg total) by mouth  daily. 11/09/21   Latrelle Dodrill, MD  tolterodine (DETROL LA) 4 MG 24 hr capsule Take 4 mg by mouth daily. 10/13/22   [provider]  Vibegron (GEMTESA) 75 MG TABS Take 75 mg by mouth daily.    [provider]  levofloxacin (LEVAQUIN) 750 MG tablet Take 1 tablet (750 mg total) by mouth daily. For 14 days 07/07/12 07/24/12  Madolyn Frieze, Etta Quill, MD      Allergies    Codeine, Latex, Morphine, and Propofol    Review of Systems   Review of Systems  Constitutional: Negative.   HENT: Negative.    Respiratory: Negative.    Cardiovascular: Negative.   Gastrointestinal: Negative.   Genitourinary:  Positive for frequency.  Musculoskeletal: Negative.   Skin:  Positive for wound.    Physical Exam Updated Vital Signs BP 129/61 (BP Location: Right Arm)   Pulse 93   Temp 98.4 F (36.9 C) (Oral)   Resp 18   Ht 5\' 10"  (1.778 m)   Wt 77.1 kg   SpO2 100%   BMI 24.39 kg/m  Physical Exam Vitals and nursing note reviewed.  Constitutional:      Appearance: He is not ill-appearing or toxic-appearing.  HENT:     Head: Normocephalic and atraumatic.     Mouth/Throat:     Mouth: Mucous membranes are moist.     Pharynx: No oropharyngeal exudate or posterior oropharyngeal erythema.  Eyes:     General:        Right eye: No discharge.        Left eye: No discharge.     Conjunctiva/sclera: Conjunctivae normal.  Cardiovascular:     Rate and Rhythm: Normal rate and regular rhythm.     Pulses: Normal pulses.  Pulmonary:     Effort: Pulmonary effort is normal. No respiratory distress.     Breath sounds: Normal breath sounds. No wheezing or rales.  Abdominal:     General: Bowel sounds are normal. There is no distension.     Palpations: Abdomen is soft.     Tenderness: There is no abdominal tenderness. There is no guarding or rebound.  Musculoskeletal:     Cervical back: Neck supple.       Feet:     Right Lower Extremity: Right leg is amputated below knee.  (Transtibial) Skin:    General: Skin is warm and dry.  Neurological:     General: No focal deficit present.     Mental Status: He is alert and oriented to person, place, and time. Mental status is at baseline.  Psychiatric:        Mood and Affect: Mood normal.     ED Results / Procedures / Treatments   Labs (all labs ordered are listed, but only abnormal results are displayed) Labs Reviewed  BASIC METABOLIC PANEL - Abnormal; Notable for the following components:      Result Value  Sodium 132 (*)    Glucose, Bld 354 (*)    Calcium 7.1 (*)    All other components within normal limits  CBC - Abnormal; Notable for the following components:   WBC 2.9 (*)    RBC 3.05 (*)    Hemoglobin 8.3 (*)    HCT 25.4 (*)    Platelets 104 (*)    All other components within normal limits  BETA-HYDROXYBUTYRIC ACID - Abnormal; Notable for the following components:   Beta-Hydroxybutyric Acid 0.28 (*)    All other components within normal limits  CBG MONITORING, ED - Abnormal; Notable for the following components:   Glucose-Capillary 343 (*)    All other components within normal limits  CBG MONITORING, ED - Abnormal; Notable for the following components:   Glucose-Capillary 413 (*)    All other components within normal limits  URINALYSIS, ROUTINE W REFLEX MICROSCOPIC  BLOOD GAS, VENOUS    EKG None  Radiology No results found.  Procedures Procedures  {Document cardiac monitor, telemetry assessment procedure when appropriate:1}  Medications Ordered in ED Medications - No data to display  ED Course/ Medical Decision Making/ A&P   {   Click here for ABCD2, HEART and other calculatorsREFRESH Note before signing :1}                          Medical Decision Making Amount and/or Complexity of Data Reviewed Labs: ordered.   ***  {Document critical care time when appropriate:1} {Document review of labs and clinical decision tools ie heart score, Chads2Vasc2 etc:1}  {Document your  independent review of radiology images, and any outside records:1} {Document your discussion with family members, caretakers, and with consultants:1} {Document social determinants of health affecting pt's care:1} {Document your decision making why or why not admission, treatments were needed:1} Final Clinical Impression(s) / ED Diagnoses Final diagnoses:  None    Rx / DC Orders ED Discharge Orders     None

## 2022-12-27 NOTE — ED Provider Triage Note (Signed)
Emergency Medicine Provider Triage Evaluation Note  Adam Ray , a 67 y.o. male  was evaluated in triage.  Pt complains of hyperglycemia  Review of Systems  Positive:  Negative:   Physical Exam  BP 126/68 (BP Location: Right Arm)   Pulse 77   Temp 98.3 F (36.8 C) (Oral)   Resp 19   Ht 5\' 10"  (1.778 m)   Wt 77.1 kg   SpO2 96%   BMI 24.39 kg/m  Gen:   Awake, no distress   Resp:  Normal effort  MSK:   Moves extremities without difficulty  Other:    Medical Decision Making  Medically screening exam initiated at 7:35 PM.  Appropriate orders placed.  Adam Ray was informed that the remainder of the evaluation will be completed by another provider, this initial triage assessment does not replace that evaluation, and the importance of remaining in the ED until their evaluation is complete.  Complaining of high blood sugar. Also with urinary frequency. Denies abdominal pain, nausea, vomiting, chest pain, dyspnea.  Ordering labs. Patient is stable at this time.   Dorthy Cooler, [place] 12/27/22 [phone]

## 2022-12-27 NOTE — ED Triage Notes (Signed)
Pt arrived from home via [facility] c/op hyperglycemia that started this am. Pt states that home cbg read HI all day. Pt had recent anterior left foot amputation last Wed

## 2022-12-28 LAB — URINALYSIS, ROUTINE W REFLEX MICROSCOPIC
Bacteria, UA: NONE SEEN
Bilirubin Urine: NEGATIVE
Glucose, UA: >=500 mg/dL — AB
Hgb urine dipstick: NEGATIVE
Ketones, ur: NEGATIVE mg/dL
Leukocytes,Ua: NEGATIVE
Nitrite: NEGATIVE
Protein, ur: NEGATIVE mg/dL
Specific Gravity, Urine: 1.013 (ref 1.005–1.030)
pH: 5 (ref 5.0–8.0)

## 2022-12-28 LAB — CBG MONITORING, ED
Glucose-Capillary: 272 mg/dL — ABNORMAL HIGH (ref 70–99)
Glucose-Capillary: 168 mg/dL — ABNORMAL HIGH (ref 70–99)

## 2022-12-28 LAB — OSMOLALITY: Osmolality: 292 mOsm/kg (ref 275–295)

## 2022-12-28 MED ORDER — LACTATED RINGERS IV BOLUS
1000.0000 mL | Freq: Once | INTRAVENOUS | Status: AC
  Administered 2022-12-28: 1000 mL via INTRAVENOUS

## 2022-12-28 NOTE — Discharge Instructions (Signed)
You were seen in the ER today for hyperglycemia.  This was significantly improved following insulin and IV fluids in the emergency department.  Follow-up with your primary care doctor to discuss dosing of your insulin and return to the ER with any severe symptoms.

## 2022-12-31 ENCOUNTER — Telehealth: Payer: Self-pay | Admitting: Student

## 2022-12-31 ENCOUNTER — Ambulatory Visit: Payer: No Typology Code available for payment source | Admitting: Family Medicine

## 2022-12-31 NOTE — Telephone Encounter (Addendum)
**   AFTER- HOURS CALL/ EMERGENCY LINE **  I called and spoke with patient and his wife.  They are concerned about his hyperglycemia.  His current blood glucose level is 412.  He took his 30 units of Lantus (insulin glargine) this morning.  He says he is prescribed NovoLog (insulin aspart), but does not take it.  He did take 4 units of NovoLog yesterday, but has not taken any today.  He denies nausea, vomiting.  He says he has been drinking fluids well without difficulty.  Given he is currently feeling well, does not have any signs or symptoms concerning for DKA at this time, I encouraged him to take 8 to 10 units of insulin aspart and recheck his CBG in 1 hour.  I educated him that his glucose level should trend downward after this.  If it does not, he should go to the ER for further evaluation.  Discussed that I am not able to assess him over the phone, and the best recommendation is always to have an in person evaluation.  I scheduled him for an appointment at [facility] clinic on 01/01/2023 per patient request.  Due to transportation barriers, he is unable to make an appointment on 7/2.  Patient and his wife agree and understand to the above.  Darral Dash, DO PGY-3 [facility]

## 2023-01-01 ENCOUNTER — Encounter: Payer: Self-pay | Admitting: Family

## 2023-01-01 ENCOUNTER — Ambulatory Visit: Payer: No Typology Code available for payment source | Admitting: Family Medicine

## 2023-01-01 ENCOUNTER — Ambulatory Visit: Payer: No Typology Code available for payment source | Admitting: Family

## 2023-01-01 DIAGNOSIS — Z89432 Acquired absence of left foot: Secondary | ICD-10-CM

## 2023-01-01 NOTE — Progress Notes (Signed)
Post-Op Visit Note   Patient: Adam Ray           Date of Birth: 1956-04-05           MRN: [id number] Visit Date: 01/01/2023 PCP: Latrelle Dodrill, MD  Chief Complaint:  Chief Complaint  Patient presents with   Left Foot - Routine Post Op    12/25/22 left transmet amputation    HPI:  HPI The patient is a 67 year old gentleman seen status post left transmetatarsal amputation June 26 Ortho Exam On examination left transmetatarsal amputation this is approximated sutures there is a small area of dehiscence laterally this is 15 mm in length open 5 mm in width that is filled in with granulation there is scant serosanguineous drainage she does have some edema no erythema or warmth  Visit Diagnoses: No diagnosis found.  Plan: Begin daily dose of cleansing dry dressings elevate for swelling have recommended the use compression garments.  Follow-Up Instructions: No follow-ups on file.   Imaging: No results found.  Orders:  No orders of the defined types were placed in this encounter.  No orders of the defined types were placed in this encounter.    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   Atrophy of tongue papillae 10/02/2022   Atherosclerosis of coronary artery without angina pectoris 10/02/2022   Age-related nuclear cataract of left eye 10/02/2022   Brown hairy tongue 10/02/2022   Proliferative diabetic retinopathy of right eye associated with type 2 diabetes mellitus (HCC) 10/02/2022   Tongue discoloration 02/14/2022   Hematuria 08/16/2021   Cervical spondylosis without myelopathy 12/14/2020   Pain in joint of  left shoulder 12/14/2020   Urinary incontinence 08/10/2020   Increased urinary frequency 05/09/2020   Nocturnal leg cramps 10/28/2019   Hyperlipidemia associated with type 2 diabetes mellitus (HCC) 05/26/2019   Limited mobility 05/12/2018   Anxiety state 06/02/2017   Leg swelling 01/29/2017   Idiopathic chronic venous hypertension of both lower extremities with inflammation 09/24/2016   Diabetic polyneuropathy associated with type 2 diabetes mellitus (HCC)    Type 2 diabetes mellitus with neurologic complication, with long-term current use of insulin (HCC) 12/08/2015   Left shoulder pain 08/10/2015   Pulmonary nodule 02/01/2015   Depression 12/09/2013   Warts, genital 08/20/2013   Diabetic retinopathy (HCC) 01/28/2013   Sleep apnea 09/06/2010   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   Hypertension associated with diabetes (HCC) 11/09/2008   COPD, mild (HCC) 10/06/2006   SICKLE-CELL TRAIT 04/26/2006   ERECTILE DYSFUNCTION 04/26/2006   Tobacco use 04/26/2006   Peripheral arterial disease (HCC) 04/26/2006   Allergic rhinitis 04/26/2006   Past Medical History:  Diagnosis Date   Allergy    Arthritis    Bronchitis    Cataract    CHF (congestive heart failure) (HCC)    Chronic kidney disease    Claudication (HCC)    right foot ray resection   Colon polyps    hyperplastic   COPD (chronic obstructive pulmonary disease) (HCC)    Coronary artery disease    Diabetes mellitus    type II   Genital warts    Gout    Hyperlipidemia  Hypertension    Myocardial infarction [facility])    Osteomyelitis of third toe of right foot (HCC)    Pneumonia    Status post amputation of toe of right foot (HCC) 09/24/2016   Status post transmetatarsal amputation of foot, 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/2018   STEMI involving left circumflex coronary artery (HCC) 07/12/2018   Coronary artery disease   Subacute osteomyelitis, right ankle and foot (HCC) 01/29/2016   Testicular mass 04/18/2016    Family History  Problem  Relation Age of Onset   Diabetes Mother    Stroke Mother    Heart failure Father    Heart attack Father    Diabetes Sister        multiple siblings   Diabetes Brother        muliple siblings   Heart disease Brother    Colon cancer Neg Hx    Esophageal cancer Neg Hx    Rectal cancer Neg Hx    Stomach cancer Neg Hx     Past Surgical History:  Procedure Laterality Date   AMPUTATION Right 01/31/2016   Procedure: Right 2nd Toe Amputation;  Surgeon: [name], MD;  Location: [facility] OR;  Service: Orthopedics;  Laterality: Right;   AMPUTATION Right 07/08/2018   Procedure: RIGHT TRANSMETATARSAL AMPUTATION;  Surgeon: [name], MD;  Location: [facility] OR;  Service: Orthopedics;  Laterality: Right;   AMPUTATION Right 01/05/2020   Procedure: RIGHT BELOW KNEE AMPUTATION;  Surgeon: [name], MD;  Location: [facility] OR;  Service: Orthopedics;  Laterality: Right;   AMPUTATION Right 02/23/2020   Procedure: RIGHT BELOW KNEE AMPUTATION REVISION;  Surgeon: [name], MD;  Location: [facility] OR;  Service: Orthopedics;  Laterality: Right;   AMPUTATION Left 10/11/2022   Procedure: LEFT GREAT TOE AMPUTATION AND LEFT 2ND TOE AMPUTATION;  Surgeon: [name], MD;  Location: [facility] OR;  Service: Orthopedics;  Laterality: Left;   AMPUTATION Left 12/25/2022   Procedure: LEFT TRANSMETATARSAL AMPUTATION;  Surgeon: [name], MD;  Location: [facility] OR;  Service: Orthopedics;  Laterality: Left;   CATARACT EXTRACTION     right eye   COLONOSCOPY     CORONARY STENT INTERVENTION N/A 07/12/2018   Procedure: CORONARY STENT INTERVENTION;  Surgeon: Lennette Bihari, MD;  Location: [facility] INVASIVE CV LAB;  Service: Cardiovascular;  Laterality: N/A;   CORONARY/GRAFT ACUTE MI REVASCULARIZATION N/A 07/12/2018   Procedure: Coronary/Graft Acute MI Revascularization;  Surgeon: Lennette Bihari, MD;  Location: [facility] INVASIVE CV LAB;  Service: Cardiovascular;  Laterality: N/A;   I & D EXTREMITY  04/11/2012   Procedure: IRRIGATION AND DEBRIDEMENT  EXTREMITY;  Surgeon: Toni Arthurs, MD;  Location: [facility] OR;  Service: Orthopedics;  Laterality: Right;   LEFT HEART CATH AND CORONARY ANGIOGRAPHY N/A 07/12/2018   Procedure: LEFT HEART CATH AND CORONARY ANGIOGRAPHY;  Surgeon: Lennette Bihari, MD;  Location: [facility] INVASIVE CV LAB;  Service: Cardiovascular;  Laterality: N/A;   Surgery left great toe     Tear ducts bilateral eyes     TRANSMETATARSAL AMPUTATION Right 07/08/2018   Social History   Occupational History   Occupation: disabled  Tobacco Use   Smoking status: Former    Packs/day: 0.30    Years: 48.00    Additional pack years: 0.00    Total pack years: 14.40    Types: Cigars, Cigarettes    Start date: 07/02/1963    Quit date: 06/01/2019    Years since quitting: 3.5   Smokeless tobacco: Former  Advertising account planner  Vaping Use: Former  Substance and Sexual Activity   Alcohol use: Not Currently   Drug use: No   Sexual activity: Not on file

## 2023-01-06 ENCOUNTER — Other Ambulatory Visit: Payer: Self-pay | Admitting: Student

## 2023-01-06 ENCOUNTER — Telehealth: Payer: Self-pay | Admitting: Pharmacist

## 2023-01-06 ENCOUNTER — Telehealth: Payer: Self-pay | Admitting: Orthopedic Surgery

## 2023-01-06 DIAGNOSIS — Z794 Long term (current) use of insulin: Secondary | ICD-10-CM

## 2023-01-06 MED ORDER — LANTUS SOLOSTAR 100 UNIT/ML ~~LOC~~ SOPN
40.0000 [IU] | PEN_INJECTOR | Freq: Every day | SUBCUTANEOUS | 3 refills | Status: DC
Start: 2023-01-06 — End: 2023-01-16

## 2023-01-06 NOTE — Telephone Encounter (Signed)
Reviewed and agree with Dr Koval's plan.   

## 2023-01-06 NOTE — Telephone Encounter (Signed)
LM for Adam Ray okay per Dr. Lajoyce Corners to use neosporin on incision.

## 2023-01-06 NOTE — Telephone Encounter (Signed)
Pt's wife called to see if it's ok to put neosporin on his incision. Please call pt at [phone].

## 2023-01-06 NOTE — Progress Notes (Signed)
**   AFTER- HOURS/EMERGENCY LINE **  Received call from patient regarding blood glucose. His wife had Mr. Groebner on speaker phone with her.  They say that his glucometer was reading "high" at 9:45 PM. Took 10 units of insulin novolog at 9:30 PM. He is feeling okay other than feeling thirsty and urinating more frequently.  He took his Lantus 40 units this morning, metformin 500 mg, and dulaglutide 1.5 mg.  Today, he ate a hot dog, hamburger, potato salad.   Per chart review, his insulin NovoLog was discontinued due to hypoglycemic episodes to the 40s.  He is currently prescribed Lantus 40 units daily, and metformin 500 mg daily.  I encouraged them to monitor CBGs throughout the evening.  Discouraged using any more doses of short acting insulin this evening and educated them on the risks of hypoglycemia.  His most recent A1c is 6.6%, very well-controlled.  He seems to have some lability in his glucose.  His wife asked if broccoli or lemon water will help to bring down his blood sugar.  He has an appointment in the morning at [facility] clinic with Dr. Raymondo Band to have CGM placed as well as follow-up on 7/18 with PCP to discuss type 2 diabetes.  I am hopeful that CGM monitoring will give [place] more information into his glucose curve.  ED return precautions discussed.  Also counseled him on the signs and symptoms and management of hypoglycemia should this occur.  Darral Dash, DO PGY-3 [facility]

## 2023-01-06 NOTE — Telephone Encounter (Signed)
Patient called reporting her blood sugars are running "high".   Since last contact patient reports he has not been using CGM due to them not sticking to skin.  He states he is checking with his blood glucose meter.   Current Medications include: lantus 30 units daily.  Patient denies any significant medication related side effects.  Medication Plan: -increasing Lantus (insulin glargine  to 40 units daily.  Asked to take additional 10 units of lantus now AND then increase to 40 units daily with next dose in the AM.   I scheduled an appointment with me tomorrow to be seen for assistance with CGM and insuln use.  He has appointment with Dr. Lajoyce Corners on 7/11 and Dr. Pollie Meyer on 7/18    Total time with patient call and documentation of interaction: 11 minutes.

## 2023-01-07 ENCOUNTER — Encounter: Payer: Self-pay | Admitting: Pharmacist

## 2023-01-07 ENCOUNTER — Ambulatory Visit (INDEPENDENT_AMBULATORY_CARE_PROVIDER_SITE_OTHER): Payer: 59 | Admitting: Pharmacist

## 2023-01-07 ENCOUNTER — Telehealth: Payer: Self-pay | Admitting: Orthopedic Surgery

## 2023-01-07 DIAGNOSIS — E1142 Type 2 diabetes mellitus with diabetic polyneuropathy: Secondary | ICD-10-CM

## 2023-01-07 NOTE — Progress Notes (Signed)
S:     Chief Complaint  Patient presents with   Medication Management    Diabetes - CGM    67 y.o. male who presents for diabetes evaluation, education, and management. Patient arrives in good spirits and presents with assistance of motorized scooter. Patient is accompanied by his spouse, Marylene Land.   Patient was referred and last seen by Primary Care Provider, Dr. Pollie Meyer, on 12/16/2022.   PMH is significant for inability to effectively place CGM sensor at home.  Reporting it did not work/connect.  Yesterday, I asked him to come back in to review CGM placement and discuss options for managing blood sugar.   Current diabetes medications include: insulin glargine (Lantus) 30 units daily, dulaglutide (Trulicity) 1.5 mg weekly (Mondays), metformin 1000 mg BID Current hypertension medications include: metoprolol 25 mg BID Current hyperlipidemia medications include: rosuvastatin 20 mg daily    Patient reports adherence to taking all medications as prescribed.   Do you feel that your medications are working for you? yes Have you been experiencing any side effects to the medications prescribed? no  Patient denies hypoglycemic events.  Reported home fasting blood sugars: Hi and readings in the higher 300s over the last week.    Patient reports nocturia (nighttime urination).   Patient reported dietary habits: Eats 3 meals/day Breakfast: moderate sized meal Lunch: small Dinner: largest meal of the day Snacks: fruits including melons and grapes - discussed limiting quantities/decreasing intake of sweet fruits Drinks: asked patient to STOP drinking all sweet tea.  Patient-reported exercise habits: limited mobility - stays in the bed more than his wife would like him to.    O:   Review of Systems  Genitourinary:  Positive for frequency.    Physical Exam Constitutional:      Appearance: Normal appearance.  Pulmonary:     Effort: Pulmonary effort is normal.  Neurological:      General: No focal deficit present.     Mental Status: He is alert and oriented to person, place, and time. Mental status is at baseline.  Psychiatric:        Mood and Affect: Mood normal.        Thought Content: Thought content normal.     7 day average blood glucose: > 350  LIbre 3 CGM Download: unavailable    Lab Results  Component Value Date   HGBA1C 6.6 11/18/2022    Lipid Panel     Component Value Date/Time   CHOL 97 (L) 01/17/2022 1514   TRIG 299 (H) 01/17/2022 1514   HDL 40 01/17/2022 1514   CHOLHDL 2.4 01/17/2022 1514   CHOLHDL 2.4 10/18/2018 1612   VLDL 44 (H) 10/18/2018 1612   LDLCALC 14 01/17/2022 1514   LDLDIRECT 78 09/11/2011 1503    Clinical Atherosclerotic Cardiovascular Disease (ASCVD): Yes  The ASCVD Risk score (Arnett DK, et al., 2019) failed to calculate for the following reasons:   The patient has a prior MI or stroke diagnosis    A/P: Diabetes longstanding and currently with poor control likely related to PAD and pain/stress associated with wound healing of left leg. Patient is able to verbalize appropriate hypoglycemia management plan. Medication adherence reported to be good. Control is suboptimal due to stress and inadequate insulin dose. CGM placement and application reviewed again. Sample sensor applied by patient. Patient verbalized understanding of placement and technique.    -Continued basal insulin Lantus (insulin glargine) at dose of 40 units (increased yesterday)  -Restarted rapid insulin Novolog (insulin aspart)  at 10 units prior to largest meal of the day.  -Continued GLP-1 Trulicity (dulaglutide) 1.5mg  weekly.   -Continued metformin 500mg  BID.  -Patient educated on purpose, proper use, and potential adverse effects. -Extensively discussed pathophysiology of diabetes, recommended lifestyle interventions, dietary effects on blood sugar control.  -Counseled on s/sx of and management of hypoglycemia.  - discussed short-term goal (by next  appointment with PCP) seeing more readings in the 200-300 range.    Written patient instructions provided. Patient verbalized understanding of treatment plan.  Total time in face to face counseling 22 minutes.    Follow-up:  Pharmacist PRN. PCP clinic visit in 7/18

## 2023-01-07 NOTE — Telephone Encounter (Signed)
Patient called asked if he can get a Rx refilled Doxycycline? The number to contact patient is [phone]

## 2023-01-07 NOTE — Telephone Encounter (Signed)
Called pt asked to return call with some details about request for abx. Asked on VM if he had any fever/chills/sweats, any increased pain more than usually baseline. Asked to call back to discuss.

## 2023-01-07 NOTE — Patient Instructions (Signed)
It was nice to see you today!  Your goal blood sugar is 80-130 before eating and less than 180 after eating.   Medication Changes: Continue Lantus 40 units once daily   Begin Novolog 10 units with largest meal of the day.    Monitor blood sugars at home and keep a log (glucometer or piece of paper) to bring with you to your next visit.  Keep up the good work with diet and exercise. Aim for a diet full of vegetables, fruit and lean meats (chicken, [place], fish). Try to limit salt intake by eating fresh or frozen vegetables (instead of canned), rinse canned vegetables prior to cooking and do not add any additional salt to meals.

## 2023-01-07 NOTE — Assessment & Plan Note (Signed)
Diabetes longstanding and currently with poor control likely related to PAD and pain/stress associated with wound healing of left leg. Patient is able to verbalize appropriate hypoglycemia management plan. Medication adherence reported to be good. Control is suboptimal due to stress and inadequate insulin dose. CGM placement and application reviewed again. Sample sensor applied by patient. Patient verbalized understanding of placement and technique.    -Continued basal insulin Lantus (insulin glargine) at dose of 40 units (increased yesterday)  -Restarted rapid insulin Novolog (insulin aspart) at 10 units prior to largest meal of the day.  -Continued GLP-1 Trulicity (dulaglutide) 1.5mg  weekly.   -Continued metformin 500mg  BID.  -Patient educated on purpose, proper use, and potential adverse effects. -Extensively discussed pathophysiology of diabetes, recommended lifestyle interventions, dietary effects on blood sugar control.  -Counseled on s/sx of and management of hypoglycemia.  - discussed short-term goal (by next appointment with PCP) seeing more readings in the 200-300 range.

## 2023-01-08 NOTE — Telephone Encounter (Signed)
Still no return call from my VM left yesterday. He does have an appt tomorrow with Dr. Lajoyce Corners.

## 2023-01-08 NOTE — Progress Notes (Signed)
Reviewed and agree with Dr Koval's plan.   

## 2023-01-09 ENCOUNTER — Ambulatory Visit (INDEPENDENT_AMBULATORY_CARE_PROVIDER_SITE_OTHER): Payer: 59 | Admitting: Orthopedic Surgery

## 2023-01-09 DIAGNOSIS — Z89432 Acquired absence of left foot: Secondary | ICD-10-CM

## 2023-01-13 ENCOUNTER — Other Ambulatory Visit: Payer: Self-pay | Admitting: Orthopedic Surgery

## 2023-01-13 ENCOUNTER — Telehealth: Payer: Self-pay | Admitting: Orthopedic Surgery

## 2023-01-13 MED ORDER — DOXYCYCLINE HYCLATE 100 MG PO TABS: 100.0000 mg | ORAL_TABLET | Freq: Two times a day (BID) | ORAL | 0 refills | Status: DC

## 2023-01-13 NOTE — Telephone Encounter (Signed)
Patient's wife Marylene Land called advised the Rx for Doxycycline was not sent to the pharmacy. Marylene Land asked for a call back when Rx is sent in. The number to contact Marylene Land is [phone]

## 2023-01-13 NOTE — Telephone Encounter (Signed)
I called and sw pt's wife to advise rx available at pharmacy

## 2023-01-13 NOTE — Telephone Encounter (Signed)
Pt was in the office 01/09/2023. Pt's wife states that ABX was going to be called in but has not been sent to pharmacy. Please advise.

## 2023-01-16 ENCOUNTER — Encounter: Payer: Self-pay | Admitting: Family Medicine

## 2023-01-16 ENCOUNTER — Ambulatory Visit (INDEPENDENT_AMBULATORY_CARE_PROVIDER_SITE_OTHER): Payer: 59 | Admitting: Family Medicine

## 2023-01-16 ENCOUNTER — Encounter: Payer: Self-pay | Admitting: Orthopedic Surgery

## 2023-01-16 ENCOUNTER — Other Ambulatory Visit: Payer: Self-pay

## 2023-01-16 ENCOUNTER — Ambulatory Visit: Payer: No Typology Code available for payment source | Admitting: Orthopedic Surgery

## 2023-01-16 ENCOUNTER — Encounter: Payer: No Typology Code available for payment source | Admitting: Orthopedic Surgery

## 2023-01-16 VITALS — BP 105/65 | HR 69 | Ht 70.0 in | Wt 166.4 lb

## 2023-01-16 DIAGNOSIS — E1141 Type 2 diabetes mellitus with diabetic mononeuropathy: Secondary | ICD-10-CM | POA: Diagnosis not present

## 2023-01-16 DIAGNOSIS — Z794 Long term (current) use of insulin: Secondary | ICD-10-CM | POA: Diagnosis not present

## 2023-01-16 DIAGNOSIS — Z89432 Acquired absence of left foot: Secondary | ICD-10-CM

## 2023-01-16 DIAGNOSIS — R937 Abnormal findings on diagnostic imaging of other parts of musculoskeletal system: Secondary | ICD-10-CM

## 2023-01-16 DIAGNOSIS — I890 Lymphedema, not elsewhere classified: Secondary | ICD-10-CM

## 2023-01-16 DIAGNOSIS — I872 Venous insufficiency (chronic) (peripheral): Secondary | ICD-10-CM

## 2023-01-16 MED ORDER — LANTUS SOLOSTAR 100 UNIT/ML ~~LOC~~ SOPN: 43.0000 [IU] | PEN_INJECTOR | Freq: Every day | SUBCUTANEOUS | Status: DC

## 2023-01-16 NOTE — Assessment & Plan Note (Signed)
Again discussed need for hematology consultation with patient.  He has not yet heard about an appointment.  I touched base with our referrals coordinator who advised this has been sent to the heme office.  Hopefully he will have heard by the next appointment with me.

## 2023-01-16 NOTE — Progress Notes (Signed)
  Date of Visit: 01/16/2023   SUBJECTIVE:   HPI:  Adam Ray presents today for routine follow-up.  Diabetes: Currently taking Lantus 40 units daily and NovoLog 10 units before his largest meal of the day.  Also taking Trulicity 1.5 mg weekly and metformin 500 mg twice daily.  He has a CGM in place.  Denies any low sugar warnings.  On review of his meter, approximately 90% of his sugars have been above range in the last week.  Hematology referral: He has not yet heard about the hematology referral I sent in last month.  OBJECTIVE:   BP 105/65   Pulse 69   Ht 5\' 10"  (1.778 m)   Wt 166 lb 6.4 oz (75.5 kg)   SpO2 99%   BMI 23.88 kg/m  Gen: No acute stress, pleasant, cooperative HEENT: Normocephalic, atraumatic Lungs: Normal respiratory effort on room air  ASSESSMENT/PLAN:   Type 2 diabetes mellitus with neurologic complication, with long-term current use of insulin (HCC) Glucose not well-controlled based on CGM readings.  Will cautiously increase Lantus to 43 units daily and NovoLog to 12 units daily before his largest meal.  Notably has had hypoglycemic episodes in the past, so I am slowly titrating him back up.  Scheduled follow-up appointment for him on 7/29 with myself.  Abnormal bone radiograph Again discussed need for hematology consultation with patient.  He has not yet heard about an appointment.  I touched base with our referrals coordinator who advised this has been sent to the heme office.  Hopefully he will have heard by the next appointment with me.  FOLLOW UP: Follow up in 11 days for diabetes as scheduled, sooner if needed  [name], MD [facility]

## 2023-01-16 NOTE — Assessment & Plan Note (Signed)
Glucose not well-controlled based on CGM readings.  Will cautiously increase Lantus to 43 units daily and NovoLog to 12 units daily before his largest meal.  Notably has had hypoglycemic episodes in the past, so I am slowly titrating him back up.  Scheduled follow-up appointment for him on 7/29 with myself.

## 2023-01-16 NOTE — Progress Notes (Signed)
Office Visit Note   Patient: Adam Ray           Date of Birth: 11/29/1955           MRN: [id number] Visit Date: 01/16/2023              Requested by: Latrelle Dodrill, MD [address] [place],  [place] [address] PCP: Latrelle Dodrill, MD  Chief Complaint  Patient presents with   Left Foot - Routine Post Op    12/25/22 left transmet amputation      HPI: Patient is a 67 year old gentleman who is 3 weeks status post left transmetatarsal amputation.  Assessment & Plan: Visit Diagnoses:  1. History of transmetatarsal amputation of left foot (HCC)   2. Lymphedema due to venous insufficiency     Plan: Patient still has significant edema.  We will give him a extra extra-large sock he was given instructions for elevation and Dial soap cleansing.  Harvest sutures at follow-up.  Follow-Up Instructions: Return in about 2 weeks (around 01/30/2023).   Ortho Exam  Patient is alert, oriented, no adenopathy, well-dressed, normal affect, normal respiratory effort. Examination patient has significant pitting edema of the left lower extremity the incision is gaped open but there is no depth to the wound it is flat.  Again reviewed the importance of elevation.  Imaging: No results found. No images are attached to the encounter.  Labs: Lab Results  Component Value Date   HGBA1C 6.6 11/18/2022   HGBA1C 7.9 (A) 07/05/2022   HGBA1C 9.4 (H) 01/17/2022   ESRSEDRATE 54 (H) 01/01/2020   ESRSEDRATE 8 01/29/2016   ESRSEDRATE 9 04/11/2012   CRP 2.6 (H) 01/29/2016   CRP 0.5 02/28/2014   REPTSTATUS 01/18/2020 FINAL 01/17/2020   REPTSTATUS 01/22/2020 FINAL 01/17/2020   REPTSTATUS 01/22/2020 FINAL 01/17/2020   GRAMSTAIN  04/11/2012    NO WBC SEEN RARE SQUAMOUS EPITHELIAL CELLS PRESENT MODERATE GRAM POSITIVE COCCI IN PAIRS RARE GRAM NEGATIVE RODS   GRAMSTAIN  04/11/2012    NO WBC SEEN RARE SQUAMOUS EPITHELIAL CELLS PRESENT MODERATE GRAM POSITIVE COCCI IN PAIRS RARE GRAM  NEGATIVE RODS   CULT (A) 01/17/2020    <10,000 COLONIES/mL INSIGNIFICANT GROWTH Performed at [facility], [address], [place], [place] [address]    CULT  01/17/2020    NO GROWTH 5 DAYS Performed at [facility], [address] [place], [place] [address]    CULT  01/17/2020    NO GROWTH 5 DAYS Performed at [facility], [address] [place], [place] [address]    LABORGA NO GROWTH 2 DAYS 07/19/2014     Lab Results  Component Value Date   ALBUMIN 4.1 12/16/2022   ALBUMIN 4.5 01/17/2022   ALBUMIN 4.1 12/06/2020    Lab Results  Component Value Date   MG 1.5 (L) 12/27/2022   No results found for: "VD25OH"  No results found for: "PREALBUMIN"    Latest Ref Rng & Units 12/27/2022   11:42 PM 12/27/2022    7:37 PM 12/16/2022    5:13 PM  CBC EXTENDED  WBC 4.0 - 10.5 K/uL  2.9  2.8   RBC 4.22 - 5.81 MIL/uL  3.05  4.17   Hemoglobin 13.0 - 17.0 g/dL 8.8  8.3  03.4   HCT 74.2 - 52.0 % 26.0  25.4  34.6   Platelets 150 - 400 K/uL  104  97   NEUT# 1.4 - 7.0 x10E3/uL   1.0  Lymph# 0.7 - 3.1 x10E3/uL   1.1      There is no height or weight on file to calculate BMI.  Orders:  No orders of the defined types were placed in this encounter.  No orders of the defined types were placed in this encounter.    Procedures: No procedures performed  Clinical Data: No additional findings.  ROS:  All other systems negative, except as noted in the HPI. Review of Systems  Objective: Vital Signs: There were no vitals taken for this visit.  Specialty Comments:  No specialty comments available.  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) 10/02/2022   Atrophy of tongue papillae 10/02/2022   Atherosclerosis of coronary artery without angina pectoris 10/02/2022   Age-related nuclear cataract of left eye 10/02/2022   Manson Passey hairy tongue 10/02/2022   Proliferative diabetic retinopathy of right eye associated with type 2 diabetes mellitus (HCC) 10/02/2022   Tongue discoloration 02/14/2022   Hematuria 08/16/2021   Cervical spondylosis without myelopathy 12/14/2020   Pain in joint of left shoulder 12/14/2020   Urinary incontinence 08/10/2020   Increased urinary frequency 05/09/2020   Nocturnal leg cramps 10/28/2019   Hyperlipidemia associated with type 2 diabetes mellitus (HCC) 05/26/2019   Limited mobility 05/12/2018   Anxiety state 06/02/2017   Leg swelling 01/29/2017   Idiopathic chronic venous hypertension of both lower extremities with inflammation 09/24/2016   Diabetic polyneuropathy associated with type 2 diabetes mellitus (HCC)    Type 2 diabetes mellitus with neurologic complication, with long-term current use of insulin (HCC) 12/08/2015   Left shoulder pain 08/10/2015   Pulmonary nodule 02/01/2015   Depression 12/09/2013   Warts, genital 08/20/2013   Diabetic retinopathy (HCC) 01/28/2013   Sleep apnea 09/06/2010   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   Hypertension associated with diabetes (HCC) 11/09/2008   COPD, mild (HCC) 10/06/2006   SICKLE-CELL TRAIT 04/26/2006   ERECTILE DYSFUNCTION 04/26/2006   Tobacco use 04/26/2006   Peripheral arterial disease (HCC) 04/26/2006   Allergic rhinitis 04/26/2006   Past Medical History:  Diagnosis Date   Allergy    Arthritis    Bronchitis    Cataract    CHF (congestive heart failure) (HCC)    Chronic kidney disease    Claudication (HCC)    right foot ray resection   Colon polyps    hyperplastic   COPD (chronic obstructive pulmonary disease) (HCC)    Coronary artery disease    Diabetes mellitus    type II   Genital warts    Gout     Hyperlipidemia    Hypertension    Myocardial infarction (HCC)    Osteomyelitis of third toe of right foot (HCC)    Pneumonia    Status post amputation of toe of right foot (HCC) 09/24/2016   Status post transmetatarsal amputation of foot, 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/2018   STEMI involving left circumflex coronary artery (HCC) 07/12/2018   Coronary artery disease   Subacute osteomyelitis, right ankle and foot (HCC) 01/29/2016   Testicular mass 04/18/2016    Family History  Problem Relation Age of Onset   Diabetes Mother    Stroke Mother    Heart failure  Father    Heart attack Father    Diabetes Sister        multiple siblings   Diabetes Brother        muliple siblings   Heart disease Brother    Colon cancer Neg Hx    Esophageal cancer Neg Hx    Rectal cancer Neg Hx    Stomach cancer Neg Hx     Past Surgical History:  Procedure Laterality Date   AMPUTATION Right 01/31/2016   Procedure: Right 2nd Toe Amputation;  Surgeon: [name], MD;  Location: [facility] OR;  Service: Orthopedics;  Laterality: Right;   AMPUTATION Right 07/08/2018   Procedure: RIGHT TRANSMETATARSAL AMPUTATION;  Surgeon: [name], MD;  Location: [facility] OR;  Service: Orthopedics;  Laterality: Right;   AMPUTATION Right 01/05/2020   Procedure: RIGHT BELOW KNEE AMPUTATION;  Surgeon: [name], MD;  Location: [facility] OR;  Service: Orthopedics;  Laterality: Right;   AMPUTATION Right 02/23/2020   Procedure: RIGHT BELOW KNEE AMPUTATION REVISION;  Surgeon: [name], MD;  Location: [facility] OR;  Service: Orthopedics;  Laterality: Right;   AMPUTATION Left 10/11/2022   Procedure: LEFT GREAT TOE AMPUTATION AND LEFT 2ND TOE AMPUTATION;  Surgeon: [name], MD;  Location: [facility] OR;  Service: Orthopedics;  Laterality: Left;   AMPUTATION Left 12/25/2022   Procedure: LEFT TRANSMETATARSAL AMPUTATION;  Surgeon: [name], MD;  Location: [facility] OR;  Service: Orthopedics;  Laterality: Left;   CATARACT EXTRACTION     right eye   COLONOSCOPY      CORONARY STENT INTERVENTION N/A 07/12/2018   Procedure: CORONARY STENT INTERVENTION;  Surgeon: Lennette Bihari, MD;  [facility]: [facility];  Service: Cardiovascular;  Laterality: N/A;   CORONARY/GRAFT ACUTE MI REVASCULARIZATION N/A 07/12/2018   Procedure: Coronary/Graft Acute MI Revascularization;  Surgeon: Lennette Bihari, MD;  Location: [facility];  Service: Cardiovascular;  Laterality: N/A;   I & D EXTREMITY  04/11/2012   Procedure: IRRIGATION AND DEBRIDEMENT EXTREMITY;  Surgeon: Toni Arthurs, MD;  Location: [facility] OR;  Service: Orthopedics;  Laterality: Right;   LEFT HEART CATH AND CORONARY ANGIOGRAPHY N/A 07/12/2018   Procedure: LEFT HEART CATH AND CORONARY ANGIOGRAPHY;  Surgeon: Lennette Bihari, MD;  Location: [facility];  Service: Cardiovascular;  Laterality: N/A;   Surgery left great toe     Tear ducts bilateral eyes     TRANSMETATARSAL AMPUTATION Right 07/08/2018   Social History   Occupational History   Occupation: disabled  Tobacco Use   Smoking status: Former    Current packs/day: 0.00    Average packs/day: 0.3 packs/day for 55.9 years (16.8 ttl pk-yrs)    Types: Cigars, Cigarettes    Start date: 07/02/1963    Quit date: 06/01/2019    Years since quitting: 3.6   Smokeless tobacco: Former  Building services engineer status: Former  Substance and Sexual Activity   Alcohol use: Not Currently   Drug use: No   Sexual activity: Not on file

## 2023-01-16 NOTE — Patient Instructions (Addendum)
It was great to see you again today.  Go up to 43 units of lantus per day Increase novolog to 12 units with your biggest meal of the day  Follow up in 2 weeks as scheduled  Be well, Dr. Pollie Meyer

## 2023-01-17 ENCOUNTER — Encounter: Payer: Self-pay | Admitting: Orthopedic Surgery

## 2023-01-17 NOTE — Progress Notes (Signed)
Office Visit Note   Patient: Adam Ray           Date of Birth: 08-17-1955           MRN: [id number] Visit Date: 01/09/2023              Requested by: Latrelle Dodrill, MD [address] [place],  [place] [address] PCP: Latrelle Dodrill, MD  Chief Complaint  Patient presents with   Left Foot - Routine Post Op     HPI: Reports pain and swelling  Assessment & Plan: Visit Diagnoses:  1. History of transmetatarsal amputation of left foot (HCC)     Plan: evation, compression, daily cleansing  Follow-Up Instructions: Return in about 1 week (around 01/16/2023).   Ortho Exam  Patient is alert, oriented, no adenopathy, well-dressed, normal affect, normal respiratory effort. Swelling with slight wound dehiscence, wound flat, no cellulitis  Imaging: No results found. No images are attached to the encounter.  Labs: Lab Results  Component Value Date   HGBA1C 6.6 11/18/2022   HGBA1C 7.9 (A) 07/05/2022   HGBA1C 9.4 (H) 01/17/2022   ESRSEDRATE 54 (H) 01/01/2020   ESRSEDRATE 8 01/29/2016   ESRSEDRATE 9 04/11/2012   CRP 2.6 (H) 01/29/2016   CRP 0.5 02/28/2014   REPTSTATUS 01/18/2020 FINAL 01/17/2020   REPTSTATUS 01/22/2020 FINAL 01/17/2020   REPTSTATUS 01/22/2020 FINAL 01/17/2020   GRAMSTAIN  04/11/2012    NO WBC SEEN RARE SQUAMOUS EPITHELIAL CELLS PRESENT MODERATE GRAM POSITIVE COCCI IN PAIRS RARE GRAM NEGATIVE RODS   GRAMSTAIN  04/11/2012    NO WBC SEEN RARE SQUAMOUS EPITHELIAL CELLS PRESENT MODERATE GRAM POSITIVE COCCI IN PAIRS RARE GRAM NEGATIVE RODS   CULT (A) 01/17/2020    <10,000 COLONIES/mL INSIGNIFICANT GROWTH Performed at [facility], [address], [place], [place] [address]    CULT  01/17/2020    NO GROWTH 5 DAYS Performed at [facility], [address], [place], [place] [address]    CULT  01/17/2020    NO GROWTH 5 DAYS Performed at [facility], [address], [place], [place] [address]    LABORGA NO GROWTH 2  DAYS 07/19/2014     Lab Results  Component Value Date   ALBUMIN 4.1 12/16/2022   ALBUMIN 4.5 01/17/2022   ALBUMIN 4.1 12/06/2020    Lab Results  Component Value Date   MG 1.5 (L) 12/27/2022   No results found for: "VD25OH"  No results found for: "PREALBUMIN"    Latest Ref Rng & Units 12/27/2022   11:42 PM 12/27/2022    7:37 PM 12/16/2022    5:13 PM  CBC EXTENDED  WBC 4.0 - 10.5 K/uL  2.9  2.8   RBC 4.22 - 5.81 MIL/uL  3.05  4.17   Hemoglobin 13.0 - 17.0 g/dL 8.8  8.3  29.5   HCT 28.4 - 52.0 % 26.0  25.4  34.6   Platelets 150 - 400 K/uL  104  97   NEUT# 1.4 - 7.0 x10E3/uL   1.0   Lymph# 0.7 - 3.1 x10E3/uL   1.1      There is no height or weight on file to calculate BMI.  Orders:  No orders of the defined types were placed in this encounter.  No orders of the defined types were placed in this encounter.    Procedures: No procedures performed  Clinical Data: No additional findings.  ROS:  All other systems negative, except as noted in  the HPI. Review of Systems  Objective: Vital Signs: There were no vitals taken for this visit.  Specialty Comments:  No specialty comments available.  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   Atrophy of tongue papillae 10/02/2022   Atherosclerosis of coronary artery without angina pectoris 10/02/2022   Age-related nuclear cataract of left eye 10/02/2022   Manson Passey hairy tongue 10/02/2022   Proliferative diabetic retinopathy of right eye associated with type 2 diabetes mellitus (HCC) 10/02/2022   Tongue discoloration 02/14/2022   Hematuria 08/16/2021   Cervical spondylosis  without myelopathy 12/14/2020   Pain in joint of left shoulder 12/14/2020   Urinary incontinence 08/10/2020   Increased urinary frequency 05/09/2020   Nocturnal leg cramps 10/28/2019   Hyperlipidemia associated with type 2 diabetes mellitus (HCC) 05/26/2019   Limited mobility 05/12/2018   Anxiety state 06/02/2017   Leg swelling 01/29/2017   Idiopathic chronic venous hypertension of both lower extremities with inflammation 09/24/2016   Diabetic polyneuropathy associated with type 2 diabetes mellitus (HCC)    Type 2 diabetes mellitus with neurologic complication, with long-term current use of insulin (HCC) 12/08/2015   Left shoulder pain 08/10/2015   Pulmonary nodule 02/01/2015   Depression 12/09/2013   Warts, genital 08/20/2013   Diabetic retinopathy (HCC) 01/28/2013   Sleep apnea 09/06/2010   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   Hypertension associated with diabetes (HCC) 11/09/2008   COPD, mild (HCC) 10/06/2006   SICKLE-CELL TRAIT 04/26/2006   ERECTILE DYSFUNCTION 04/26/2006   Tobacco use 04/26/2006   Peripheral arterial disease (HCC) 04/26/2006   Allergic rhinitis 04/26/2006   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   Status post transmetatarsal amputation of foot, 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/2018   STEMI involving left circumflex coronary artery (HCC) 07/12/2018   Coronary artery disease   Subacute osteomyelitis, right ankle and foot (HCC) 01/29/2016    Testicular mass 04/18/2016    Family History  Problem Relation Age of Onset   Diabetes Mother    Stroke Mother    Heart failure Father    Heart attack Father    Diabetes Sister        multiple siblings   Diabetes Brother        muliple siblings   Heart disease Brother    Colon cancer Neg Hx    Esophageal cancer Neg Hx    Rectal cancer Neg Hx    Stomach cancer Neg Hx     Past Surgical History:  Procedure Laterality Date   AMPUTATION Right 01/31/2016   Procedure: Right 2nd Toe Amputation;  Surgeon: [name], MD;  Location: [facility] OR;  Service: Orthopedics;  Laterality: Right;   AMPUTATION Right 07/08/2018   Procedure: RIGHT TRANSMETATARSAL AMPUTATION;  Surgeon: [name], MD;  Location: [facility] OR;  Service: Orthopedics;  Laterality: Right;   AMPUTATION Right 01/05/2020   Procedure: RIGHT BELOW KNEE AMPUTATION;  Surgeon: [name], MD;  Location: [facility] OR;  Service: Orthopedics;  Laterality: Right;   AMPUTATION Right 02/23/2020   Procedure: RIGHT BELOW KNEE AMPUTATION REVISION;  Surgeon: [name], MD;  Location: [facility] OR;  Service: Orthopedics;  Laterality: Right;   AMPUTATION Left 10/11/2022   Procedure: LEFT GREAT TOE AMPUTATION AND LEFT 2ND TOE AMPUTATION;  Surgeon: [name], MD;  Location: [facility] OR;  Service: Orthopedics;  Laterality: Left;   AMPUTATION Left 12/25/2022   Procedure: LEFT TRANSMETATARSAL AMPUTATION;  Surgeon: [name], MD;  Location: [facility] OR;  Service: Orthopedics;  Laterality: Left;   CATARACT EXTRACTION     right eye   COLONOSCOPY     CORONARY STENT INTERVENTION N/A 07/12/2018   Procedure: CORONARY STENT INTERVENTION;  Surgeon: Lennette Bihari, MD;  [facility];  Service: Cardiovascular;  Laterality: N/A;   CORONARY/GRAFT ACUTE MI REVASCULARIZATION N/A 07/12/2018   Procedure: Coronary/Graft Acute MI Revascularization;  Surgeon: Lennette Bihari, MD;  Location: [facility];  Service: Cardiovascular;  Laterality: N/A;   I & D EXTREMITY   04/11/2012   Procedure: IRRIGATION AND DEBRIDEMENT EXTREMITY;  Surgeon: Toni Arthurs, MD;  Location: [facility] OR;  Service: Orthopedics;  Laterality: Right;   LEFT HEART CATH AND CORONARY ANGIOGRAPHY N/A 07/12/2018   Procedure: LEFT HEART CATH AND CORONARY ANGIOGRAPHY;  Surgeon: Lennette Bihari, MD;  [facility];  Service: Cardiovascular;  Laterality: N/A;   Surgery left great toe     Tear ducts bilateral eyes     TRANSMETATARSAL AMPUTATION Right 07/08/2018   Social History   Occupational History   Occupation: disabled  Tobacco Use   Smoking status: Former    Current packs/day: 0.00    Average packs/day: 0.3 packs/day for 55.9 years (16.8 ttl pk-yrs)    Types: Cigars, Cigarettes    Start date: 07/02/1963    Quit date: 06/01/2019    Years since quitting: 3.6   Smokeless tobacco: Former  Building services engineer status: Former  Substance and Sexual Activity   Alcohol use: Not Currently   Drug use: No   Sexual activity: Not on file

## 2023-01-19 ENCOUNTER — Telehealth: Payer: Self-pay | Admitting: Student

## 2023-01-19 NOTE — Telephone Encounter (Signed)
Received after-hours page.  Called patient back unaware who I would be calling.  After obtaining identifying information, I made aware that this was the emergency after-hours line.  Patient states he is not having emergency and is requesting a refill on his freestyle libre.  I informed him that this is not a prescription I can place for after hours we can call and I would advise him to call back during office hours tomorrow.  He was amenable to this.

## 2023-01-21 ENCOUNTER — Telehealth: Payer: Self-pay | Admitting: Pharmacist

## 2023-01-21 ENCOUNTER — Other Ambulatory Visit: Payer: Self-pay | Admitting: Orthopedic Surgery

## 2023-01-21 DIAGNOSIS — Z794 Long term (current) use of insulin: Secondary | ICD-10-CM

## 2023-01-21 MED ORDER — FREESTYLE LIBRE 3 SENSOR MISC
1.0000 | 11 refills | Status: DC
Start: 2023-01-21 — End: 2023-09-02

## 2023-01-21 NOTE — Telephone Encounter (Signed)
Patient called and requested new prescription for Libre3 sensors.   New prescription provided with additional refills to requested pharmacy.

## 2023-01-22 NOTE — Telephone Encounter (Signed)
Reviewed and agree with Dr Koval's plan.   

## 2023-01-27 ENCOUNTER — Encounter: Payer: Self-pay | Admitting: Family Medicine

## 2023-01-27 ENCOUNTER — Other Ambulatory Visit: Payer: Self-pay

## 2023-01-27 ENCOUNTER — Ambulatory Visit (INDEPENDENT_AMBULATORY_CARE_PROVIDER_SITE_OTHER): Payer: 59 | Admitting: Family Medicine

## 2023-01-27 VITALS — BP 96/70 | HR 74 | Ht 70.0 in | Wt 164.0 lb

## 2023-01-27 DIAGNOSIS — Z794 Long term (current) use of insulin: Secondary | ICD-10-CM

## 2023-01-27 DIAGNOSIS — E1141 Type 2 diabetes mellitus with diabetic mononeuropathy: Secondary | ICD-10-CM

## 2023-01-27 DIAGNOSIS — R937 Abnormal findings on diagnostic imaging of other parts of musculoskeletal system: Secondary | ICD-10-CM | POA: Diagnosis not present

## 2023-01-27 NOTE — Progress Notes (Unsigned)
  Date of Visit: 01/27/2023   SUBJECTIVE:   HPI:  Adam Ray presents today for follow-up of diabetes.  Diabetes: Currently taking Lantus 30 units daily.  Also taking NovoLog 12 units with his largest meal of the day.  CGM has been malfunctioning recently due to sensor issues.  He has been checking sugars with CBGs and they have all been high.  Notably at last visit we had instructed him to go up to 43 units of Lantus daily, but for some reason he went down to 30.  It is unclear why.  Needs info on the eye doctor we referred him to, has not heard about that appointment yet.  Does have upcoming appointment with hematologist regarding abnormal CT findings from urology office.  OBJECTIVE:   BP 96/70   Pulse 74   Ht 5\' 10"  (1.778 m)   Wt 164 lb (74.4 kg)   SpO2 99%   BMI 23.53 kg/m  Gen: No acute distress, pleasant, cooperative, well-appearing HEENT: Normocephalic, atraumatic Heart: Regular rate and rhythm, no murmur Lungs: Clear to auscultation bilaterally, normal effort Neuro: Grossly nonfocal, speech normal  ASSESSMENT/PLAN:   Health maintenance:  -Provided contact information for ophthalmology office so he can call to be scheduled  Type 2 diabetes mellitus with neurologic complication, with long-term current use of insulin (HCC) Unclear why patient went down on his Lantus rather than up.  In any case, we will increase it to Lantus 35 units daily.  I will see him back next week to see how he is doing on this.  He is agreeable to this plan.  Abnormal bone radiograph Has upcoming hematology appointment.  FOLLOW UP: Follow up in next week for diabetes  [name], MD [facility]

## 2023-01-27 NOTE — Progress Notes (Signed)
Asked by Dr. Pollie Meyer to assist with obtaining [facility] 3 sample and applying device.   Patient and spouse educated on purpose, proper use and potential adverse effects of Libre 3 CGM.   Following instruction patient verbalized understanding of treatment plan.  Patient and spouse were able to place device appropriately.

## 2023-01-27 NOTE — Patient Instructions (Addendum)
It was great to see you again today.  For eye doctor, please call: [facility] Address: [address] [place], [place] [address] Phone: [phone]  Go up to 35 units daily of Lantus Hopefully can get CGM working soon  Follow up with me next week as scheduled  Be well, Dr. Pollie Meyer

## 2023-01-29 MED ORDER — LANTUS SOLOSTAR 100 UNIT/ML ~~LOC~~ SOPN
35.0000 [IU] | PEN_INJECTOR | Freq: Every day | SUBCUTANEOUS | Status: DC
Start: 2023-01-29 — End: 2023-04-28

## 2023-01-29 NOTE — Assessment & Plan Note (Signed)
Has upcoming hematology appointment.

## 2023-01-29 NOTE — Assessment & Plan Note (Addendum)
Unclear why patient went down on his Lantus rather than up.  In any case, we will increase it to Lantus 35 units daily.  I will see him back next week to see how he is doing on this.  He is agreeable to this plan.  Pharmacy team was able to work with him today to get CGM functioning.

## 2023-01-30 ENCOUNTER — Telehealth: Payer: Self-pay

## 2023-01-30 ENCOUNTER — Ambulatory Visit (INDEPENDENT_AMBULATORY_CARE_PROVIDER_SITE_OTHER): Payer: No Typology Code available for payment source | Admitting: Orthopedic Surgery

## 2023-01-30 DIAGNOSIS — Z89432 Acquired absence of left foot: Secondary | ICD-10-CM

## 2023-01-30 NOTE — Telephone Encounter (Signed)
RN called and spoke with patient's wife regarding upcoming Dr. Mosetta Putt appointment scheduled for Saturday, 8/3 at 1000. Patient's wife verbalized understanding of date, time and location of appointment and states they will be here.

## 2023-01-31 NOTE — Progress Notes (Unsigned)
[facility]   [phone] [phone]   Clinic New consult Note   Patient Care Team: Latrelle Dodrill, MD as PCP - General (Family Medicine) Runell Gess, MD as PCP - Cardiology (Cardiology) 02/01/2023  CHIEF COMPLAINTS/PURPOSE OF CONSULTATION:  Pancytopenia and bone lesions  Referral physician: Latrelle Dodrill, MD    HISTORY OF PRESENTING ILLNESS:  Adam Ray 67 y.o. male is here because of pancytopenia and bone lesions on recent CT scan.  He was referred by primary care physician, presents to the clinic with his wife today.  He has longstanding history of diabetes and osteomyelitis in the past, status post right BKA.  He also has multiple other medical comorbidities including coronary artery disease, congestive heart failure, COPD.  He has been having left lower extremity edema for the past year, he had CTA of abdomen pelvis in January 2024, which reviewed a mild atherosclerotic disease in the abdomen abdominal aorta and the iliac arteries.  Scattered lucent areas throughout the bones particularly in the pelvis, was seen on previous scans, and again on the CT urogram in April 2024.  SPEP and immunofixation was done which was negative for M protein.  Serum kappa light chain was slightly elevated at 29.6, lambda light chain and ratio 1 normal.  He had a PSA checked in 2022 which was normal.   He had a mild anemia for many years, and he has developed mild leukopenia, with predominant mild neutropenia, and thrombocytopenia in the past year, with the lowest platelets 91 K, and ANC 1.0.  Hemoglobin also dropped to 8.3 in June 2024.  Not received any blood transfusion.  He is clinically stable, he uses automobile wheelchair when he goes out.  He denies any pain, no recent weight loss.  He has normal appetite and eats well.  He used to drink liquor 1 shot a day for about 10 years, but quit long time ago.   MEDICAL HISTORY:  Past Medical History:   Diagnosis Date   Allergy    Arthritis    Bronchitis    Cataract    CHF (congestive heart failure) (HCC)    Chronic kidney disease    Claudication (HCC)    right foot ray resection   Colon polyps    hyperplastic   COPD (chronic obstructive pulmonary disease) (HCC)    Coronary artery disease    Diabetes mellitus    type II   Genital warts    Gout    Hyperlipidemia    Hypertension    Myocardial infarction (HCC)    Osteomyelitis of third toe of right foot (HCC)    Pneumonia    Status post amputation of toe of right foot (HCC) 09/24/2016   Status post transmetatarsal amputation of foot, 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/2018   STEMI involving left circumflex coronary artery (HCC) 07/12/2018   Coronary artery disease   Subacute osteomyelitis, right ankle and foot (HCC) 01/29/2016   Testicular mass 04/18/2016    SURGICAL HISTORY: Past Surgical History:  Procedure Laterality Date   AMPUTATION Right 01/31/2016   Procedure: Right 2nd Toe Amputation;  Surgeon: [name], MD;  Location: [facility] OR;  Service: Orthopedics;  Laterality: Right;   AMPUTATION Right 07/08/2018   Procedure: RIGHT TRANSMETATARSAL AMPUTATION;  Surgeon: [name], MD;  Location: [facility] OR;  Service: Orthopedics;  Laterality: Right;   AMPUTATION Right 01/05/2020   Procedure: RIGHT BELOW KNEE AMPUTATION;  Surgeon: [name], MD;  Location: [facility] OR;  Service:  Orthopedics;  Laterality: Right;   AMPUTATION Right 02/23/2020   Procedure: RIGHT BELOW KNEE AMPUTATION REVISION;  Surgeon: [name], MD;  Location[facility] OR;  Service: Orthopedics;  Laterality: Right;   AMPUTATION Left 10/11/2022   Procedure: LEFT GREAT TOE AMPUTATION AND LEFT 2ND TOE AMPUTATION;  Surgeon: [name], MD;  Location: [facility] OR;  Service: Orthopedics;  Laterality: Left;   AMPUTATION Left 12/25/2022   Procedure: LEFT TRANSMETATARSAL AMPUTATION;  Surgeon: [name], MD;  Location: [facility] OR;  Service: Orthopedics;  Laterality: Left;   CATARACT EXTRACTION      right eye   COLONOSCOPY     CORONARY STENT INTERVENTION N/A 07/12/2018   Procedure: CORONARY STENT INTERVENTION;  Surgeon: Lennette Bihari, MD;  Location: [facility] INVASIVE CV LAB;  Service: Cardiovascular;  Laterality: N/A;   CORONARY/GRAFT ACUTE MI REVASCULARIZATION N/A 07/12/2018   Procedure: Coronary/Graft Acute MI Revascularization;  Surgeon: Lennette Bihari, MD;  Location: [facility] INVASIVE CV LAB;  Service: Cardiovascular;  Laterality: N/A;   I & D EXTREMITY  04/11/2012   Procedure: IRRIGATION AND DEBRIDEMENT EXTREMITY;  Surgeon: Toni Arthurs, MD;  Location: [facility] OR;  Service: Orthopedics;  Laterality: Right;   LEFT HEART CATH AND CORONARY ANGIOGRAPHY N/A 07/12/2018   Procedure: LEFT HEART CATH AND CORONARY ANGIOGRAPHY;  Surgeon: Lennette Bihari, MD;  Location: [facility] INVASIVE CV LAB;  Service: Cardiovascular;  Laterality: N/A;   Surgery left great toe     Tear ducts bilateral eyes     TRANSMETATARSAL AMPUTATION Right 07/08/2018    SOCIAL HISTORY: Social History   Socioeconomic History   Marital status: Married    Spouse name: Marylene Land   Number of children: 3   Years of education: Not on file   Highest education level: Not on file  Occupational History   Occupation: disabled  Tobacco Use   Smoking status: Former    Current packs/day: 0.00    Average packs/day: 0.3 packs/day for 55.9 years (16.8 ttl pk-yrs)    Types: Cigars, Cigarettes    Start date: 07/02/1963    Quit date: 06/01/2019    Years since quitting: 3.6   Smokeless tobacco: Former  Building services engineer status: Former  Substance and Sexual Activity   Alcohol use: Not Currently   Drug use: No   Sexual activity: Not on file  Other Topics Concern   Not on file  Social History Narrative   Not on file   Social Determinants of Health   Financial Resource Strain: Medium Risk (08/23/2022)   Overall Financial Resource Strain (CARDIA)    Difficulty of Paying Living Expenses: Somewhat hard  Food Insecurity: No Food Insecurity (02/01/2023)    Hunger Vital Sign    Worried About Running Out of Food in the Last Year: Never true    Ran Out of Food in the Last Year: Never true  Transportation Needs: Unmet Transportation Needs (02/01/2023)   PRAPARE - Transportation    Lack of Transportation (Medical): Yes    Lack of Transportation (Non-Medical): Yes  Physical Activity: Insufficiently Active (08/23/2022)   Exercise Vital Sign    Days of Exercise per Week: 2 days    Minutes of Exercise per Session: 60 min  Stress: No Stress Concern Present (08/23/2022)   [employer] of Occupational Health - Occupational Stress Questionnaire    Feeling of Stress : Not at all  Social Connections: Moderately Integrated (08/23/2022)   Social Connection and Isolation Panel [NHANES]    Frequency of Communication with Friends  and Family: Once a week    Frequency of Social Gatherings with Friends and Family: Never    Attends Religious Services: More than 4 times per year    Active Member of [employer] or Organizations: Yes    Attends Banker Meetings: 1 to 4 times per year    Marital Status: Married  Catering manager Violence: Not At Risk (02/01/2023)   Humiliation, Afraid, Rape, and Kick questionnaire    Fear of Current or Ex-Partner: No    Emotionally Abused: No    Physically Abused: No    Sexually Abused: No    FAMILY HISTORY: Family History  Problem Relation Age of Onset   Diabetes Mother    Stroke Mother    Heart failure Father    Heart attack Father    Diabetes Sister        multiple siblings   Diabetes Brother        muliple siblings   Heart disease Brother    Colon cancer Neg Hx    Esophageal cancer Neg Hx    Rectal cancer Neg Hx    Stomach cancer Neg Hx     ALLERGIES:  is allergic to codeine, latex, morphine, and propofol.  MEDICATIONS:  Current Outpatient Medications  Medication Sig Dispense Refill   acetaminophen (TYLENOL) 500 MG tablet Take 1,500 mg by mouth every 6 (six) hours as needed for moderate pain, mild  pain or headache.     aspirin 81 MG chewable tablet Chew 81 mg by mouth daily.     Cholecalciferol (VITAMIN D3) 50 MCG (2000 UT) TABS Take 2,000 Units by mouth daily.     CINNAMON PO Take 1,200 mg by mouth 2 (two) times daily. [place]     Continuous Glucose Receiver (FREESTYLE LIBRE 3 READER) DEVI 1 each by Does not apply route as directed. Use with sensor to monitor blood sugar 1 each 0   Continuous Glucose Sensor (FREESTYLE LIBRE 3 SENSOR) MISC 1 each by Does not apply route every 14 (fourteen) days. Place 1 sensor on the skin every 14 days. Use to check glucose continuously 2 each 11   diclofenac Sodium (VOLTAREN ARTHRITIS PAIN) 1 % GEL Apply 4 g topically 4 (four) times daily. (Patient taking differently: Apply 4 g topically 4 (four) times daily as needed (joint pain).) 50 g 3   doxycycline (VIBRA-TABS) 100 MG tablet Take 1 tablet (100 mg total) by mouth 2 (two) times daily. 30 tablet 0   Dulaglutide (TRULICITY) 1.5 MG/0.5ML SOPN Inject 1.5 mg into the skin once a week. 2 mL 3   famotidine (PEPCID) 20 MG tablet Take 20 mg by mouth daily as needed for heartburn or indigestion.     furosemide (LASIX) 20 MG tablet Take 1 tablet (20 mg total) by mouth 2 (two) times daily. 180 tablet 3   insulin aspart (NOVOLOG) 100 UNIT/ML injection Inject 12 Units into the skin daily before supper. Take with largest meal of day     insulin glargine (LANTUS SOLOSTAR) 100 UNIT/ML Solostar Pen Inject 35 Units into the skin daily.     metFORMIN (GLUCOPHAGE) 500 MG tablet TAKE 1 TABLET BY MOUTH 2 TIMES DAILY WITH A MEAL. (Patient taking differently: Take 500 mg by mouth 2 (two) times daily with a meal.) 180 tablet 1   metoprolol tartrate (LOPRESSOR) 25 MG tablet Take 25 mg by mouth 2 (two) times daily.     Multiple Vitamin (MULTIVITAMIN WITH MINERALS) TABS tablet Take 1 tablet by mouth daily.  iron     rosuvastatin (CRESTOR) 40 MG tablet Take 1 tablet (40 mg total) by mouth daily at 6 PM. (Patient taking differently: Take  20 mg by mouth daily at 6 PM.)     sildenafil (VIAGRA) 100 MG tablet Take 100 mg by mouth daily as needed for erectile dysfunction.     tamsulosin (FLOMAX) 0.4 MG CAPS capsule Take 1 capsule (0.4 mg total) by mouth daily. 30 capsule 3   tolterodine (DETROL LA) 4 MG 24 hr capsule Take 4 mg by mouth daily.     Vibegron (GEMTESA) 75 MG TABS Take 75 mg by mouth daily.     No current facility-administered medications for this visit.    REVIEW OF SYSTEMS:   Constitutional: Denies fevers, chills or abnormal night sweats Eyes: Denies blurriness of vision, double vision or watery eyes Ears, nose, mouth, throat, and face: Denies mucositis or sore throat Respiratory: Denies cough, dyspnea or wheezes Cardiovascular: Denies palpitation, chest discomfort or lower extremity swelling Gastrointestinal:  Denies nausea, heartburn or change in bowel habits Skin: Denies abnormal skin rashes Lymphatics: Denies new lymphadenopathy or easy bruising Neurological:Denies numbness, tingling or new weaknesses Behavioral/Psych: Mood is stable, no new changes  All other systems were reviewed with the patient and are negative.  PHYSICAL EXAMINATION: ECOG PERFORMANCE STATUS: 1 - Symptomatic but completely ambulatory  Vitals:   02/01/23 0913  BP: (!) 104/55  Pulse: 63  Resp: 18  Temp: 98.3 F (36.8 C)  SpO2: 100%   There were no vitals filed for this visit.  GENERAL:alert, no distress and comfortable SKIN: skin color, texture, turgor are normal, no rashes or significant lesions EYES: normal, conjunctiva are pink and non-injected, sclera clear OROPHARYNX:no exudate, no erythema and lips, buccal mucosa, and tongue normal  NECK: supple, thyroid normal size, non-tender, without nodularity LYMPH:  no palpable lymphadenopathy in the cervical, axillary or inguinal LUNGS: clear to auscultation and percussion with normal breathing effort HEART: regular rate & rhythm and no murmurs and no lower extremity  edema ABDOMEN:abdomen soft, non-tender and normal bowel sounds Musculoskeletal:no cyanosis of digits and no clubbing  PSYCH: alert & oriented x 3 with fluent speech NEURO: no focal motor/sensory deficits  LABORATORY DATA:  I have reviewed the data as listed    Latest Ref Rng & Units 02/01/2023    9:21 AM 12/27/2022   11:42 PM 12/27/2022    7:37 PM  CBC  WBC 4.0 - 10.5 K/uL 3.7   2.9   Hemoglobin 13.0 - 17.0 g/dL 74.2  8.8  8.3   Hematocrit 39.0 - 52.0 % 35.0  26.0  25.4   Platelets 150 - 400 K/uL 123   104        Latest Ref Rng & Units 12/27/2022   11:42 PM 12/27/2022    7:37 PM 12/16/2022    5:12 PM  CMP  Glucose 70 - 99 mg/dL  595  638   BUN 8 - 23 mg/dL  16  16   Creatinine 7.56 - 1.24 mg/dL  4.33  2.95   Sodium 188 - 145 mmol/L 134  132  141   Potassium 3.5 - 5.1 mmol/L 3.6  3.5  4.0   Chloride 98 - 111 mmol/L  103  102   CO2 22 - 32 mmol/L  22  26   Calcium 8.9 - 10.3 mg/dL  7.1  9.0   Total Protein 6.0 - 8.5 g/dL   6.2   Total Bilirubin 0.0 - 1.2 mg/dL  0.3   Alkaline Phos 44 - 121 IU/L   92   AST 0 - 40 IU/L   25   ALT 0 - 44 IU/L   20      RADIOGRAPHIC STUDIES: I have personally reviewed the radiological images as listed and agreed with the findings in the report. No results found.  ASSESSMENT & PLAN: 67 year old male with past medical history of diabetes, coronary artery disease, congestive heart failure  Pancytopenia -He had mild chronic anemia in the past, but got much worse lately, also developed a mild neutropenia and thrombocytopenia in the past year. -Will obtain anemia workup today, including iron study, B12, methylmalonic acid level, folate level, reticulocyte count, immature platelet panel -If the above labs is unrevealing, recommend a bone marrow biopsy.  His pancytopenia is concerning for MDS or other primary bone marrow disease.   2.  Diffuse bone lesions -Stable, he is not symptomatic, probably benign. -His multiple myeloma panel were  negative -Will check his PSA, if it is normal, I do not feel he needs a bone biopsy  Plan -lab today -A phone visit in a few weeks, to decide if he needs a bone marrow biopsy.   All questions were answered. The patient knows to call the clinic with any problems, questions or concerns. I spent 35 minutes counseling the patient face to face. The total time spent in the appointment was 45 minutes and more than 50% was on counseling.     Malachy Mood, MD 02/01/23 10:29 AM

## 2023-02-01 ENCOUNTER — Inpatient Hospital Stay: Payer: 59 | Attending: Hematology | Admitting: Hematology

## 2023-02-01 ENCOUNTER — Other Ambulatory Visit: Payer: Self-pay

## 2023-02-01 ENCOUNTER — Encounter: Payer: Self-pay | Admitting: Hematology

## 2023-02-01 ENCOUNTER — Inpatient Hospital Stay: Payer: 59

## 2023-02-01 VITALS — BP 104/55 | HR 63 | Temp 98.3°F | Resp 18

## 2023-02-01 DIAGNOSIS — M899 Disorder of bone, unspecified: Secondary | ICD-10-CM | POA: Insufficient documentation

## 2023-02-01 DIAGNOSIS — I509 Heart failure, unspecified: Secondary | ICD-10-CM | POA: Diagnosis not present

## 2023-02-01 DIAGNOSIS — J449 Chronic obstructive pulmonary disease, unspecified: Secondary | ICD-10-CM | POA: Diagnosis not present

## 2023-02-01 DIAGNOSIS — E119 Type 2 diabetes mellitus without complications: Secondary | ICD-10-CM | POA: Insufficient documentation

## 2023-02-01 DIAGNOSIS — D61818 Other pancytopenia: Secondary | ICD-10-CM

## 2023-02-01 DIAGNOSIS — I252 Old myocardial infarction: Secondary | ICD-10-CM | POA: Insufficient documentation

## 2023-02-01 DIAGNOSIS — I2510 Atherosclerotic heart disease of native coronary artery without angina pectoris: Secondary | ICD-10-CM | POA: Insufficient documentation

## 2023-02-01 DIAGNOSIS — Z87891 Personal history of nicotine dependence: Secondary | ICD-10-CM | POA: Insufficient documentation

## 2023-02-01 DIAGNOSIS — D649 Anemia, unspecified: Secondary | ICD-10-CM | POA: Insufficient documentation

## 2023-02-01 LAB — RETICULOCYTES
Immature Retic Fract: 28.4 % — ABNORMAL HIGH (ref 2.3–15.9)
RBC.: 4.27 MIL/uL (ref 4.22–5.81)
Retic Count, Absolute: 88.4 10*3/uL (ref 19.0–186.0)
Retic Ct Pct: 2.1 % (ref 0.4–3.1)

## 2023-02-01 LAB — CBC WITH DIFFERENTIAL (CANCER CENTER ONLY)
Abs Immature Granulocytes: 0.01 10*3/uL (ref 0.00–0.07)
Basophils Absolute: 0.1 10*3/uL (ref 0.0–0.1)
Basophils Relative: 1 %
Eosinophils Absolute: 0.2 10*3/uL (ref 0.0–0.5)
Eosinophils Relative: 5 %
HCT: 35 % — ABNORMAL LOW (ref 39.0–52.0)
Hemoglobin: 11.7 g/dL — ABNORMAL LOW (ref 13.0–17.0)
Immature Granulocytes: 0 %
Lymphocytes Relative: 37 %
Lymphs Abs: 1.4 10*3/uL (ref 0.7–4.0)
MCH: 27.5 pg (ref 26.0–34.0)
MCHC: 33.4 g/dL (ref 30.0–36.0)
MCV: 82.4 fL (ref 80.0–100.0)
Monocytes Absolute: 0.4 10*3/uL (ref 0.1–1.0)
Monocytes Relative: 10 %
Neutro Abs: 1.7 10*3/uL (ref 1.7–7.7)
Neutrophils Relative %: 47 %
Platelet Count: 123 10*3/uL — ABNORMAL LOW (ref 150–400)
RBC: 4.25 MIL/uL (ref 4.22–5.81)
RDW: 14.1 % (ref 11.5–15.5)
WBC Count: 3.7 10*3/uL — ABNORMAL LOW (ref 4.0–10.5)
nRBC: 0 % (ref 0.0–0.2)

## 2023-02-01 LAB — FOLATE: Folate: 25.1 ng/mL (ref >5.9)

## 2023-02-01 LAB — IRON AND IRON BINDING CAPACITY (CC-WL,HP ONLY)
Iron: 70 ug/dL (ref 45–182)
Saturation Ratios: 24 % (ref 17.9–39.5)
TIBC: 293 ug/dL (ref 250–450)
UIBC: 223 ug/dL (ref 117–376)

## 2023-02-01 LAB — FERRITIN: Ferritin: 113 ng/mL (ref 24–336)

## 2023-02-01 LAB — VITAMIN B12: Vitamin B-12: 221 pg/mL (ref 180–914)

## 2023-02-01 LAB — IMMATURE PLATELET FRACTION: Immature Platelet Fraction: 5 % (ref 1.2–8.6)

## 2023-02-03 ENCOUNTER — Telehealth: Payer: Self-pay | Admitting: Hematology

## 2023-02-03 LAB — PROSTATE-SPECIFIC AG, SERUM (LABCORP): Prostate Specific Ag, Serum: <0.1 ng/mL (ref 0.0–4.0)

## 2023-02-03 NOTE — Telephone Encounter (Signed)
Scheduled per 08/02 los, patient has been called and notified.

## 2023-02-04 ENCOUNTER — Other Ambulatory Visit: Payer: Self-pay

## 2023-02-06 ENCOUNTER — Other Ambulatory Visit: Payer: Self-pay

## 2023-02-06 ENCOUNTER — Encounter: Payer: Self-pay | Admitting: Family Medicine

## 2023-02-06 ENCOUNTER — Ambulatory Visit (INDEPENDENT_AMBULATORY_CARE_PROVIDER_SITE_OTHER): Payer: 59 | Admitting: Family Medicine

## 2023-02-06 VITALS — BP 95/69 | HR 86

## 2023-02-06 DIAGNOSIS — Z794 Long term (current) use of insulin: Secondary | ICD-10-CM

## 2023-02-06 DIAGNOSIS — E1141 Type 2 diabetes mellitus with diabetic mononeuropathy: Secondary | ICD-10-CM

## 2023-02-06 DIAGNOSIS — R196 Halitosis: Secondary | ICD-10-CM | POA: Diagnosis not present

## 2023-02-06 LAB — METHYLMALONIC ACID, SERUM: Methylmalonic Acid, Quantitative: 154 nmol/L (ref 0–378)

## 2023-02-06 LAB — POCT GLYCOSYLATED HEMOGLOBIN (HGB A1C): HbA1c, POC (controlled diabetic range): 10.3 % — AB (ref 0.0–7.0)

## 2023-02-06 MED ORDER — FAMOTIDINE 20 MG PO TABS: 20.0000 mg | ORAL_TABLET | Freq: Two times a day (BID) | ORAL | 1 refills | Status: DC | PRN

## 2023-02-06 NOTE — Assessment & Plan Note (Addendum)
CGM appears to be working for patient. Patient requests to continue Lantus 35 units and increase Novolog 12 units to twice daily from once daily. Given patient's increased blood sugar readings at home, Novolog to be increased to 12 units twice daily with largest meals. Patient has a postop appointment on 8/15 with orthopedics to follow up on his foot.

## 2023-02-06 NOTE — Progress Notes (Signed)
    SUBJECTIVE:   CHIEF COMPLAINT / HPI:   Adam Ray is a 67 y/o male presenting for TII DM follow up.  TII DM: Patient is currently taking Lantus 35 units daily in addition to Novolog 12 units with his largest meal of the day and Metformin 500 mg BID with meals. He reports his blood sugars have been running between 75-300. After reviewing home glucometer, average blood sugar in the past 7 days was 311; in the past 14 days, average blood sugar read 294. On 8/1, glucometer read 75. Patient denies feeling sickness or lightheadedness at that time but his CGM does alert him if it drops low.  Breath odor: Patient reports tasting his food at times in his mouth. He states his breath sometimes has an odor. Feels like taste of his food hangs out in his mouth all day. Has just one tooth that he wants to get removed.  PERTINENT  PMH / PSH: HTN, COPD  OBJECTIVE:   BP 95/69   Pulse 86   SpO2 97%    General: NAD, cooperative HEENT: Normocephalic, atraumatic Cardiovascular: RRR, no murmurs Respiratory: Clear to auscultation bilaterally, normal work of breathing Abdomen: soft, nontender to palpation   ASSESSMENT/PLAN:   Type 2 diabetes mellitus with neurologic complication, with long-term current use of insulin (HCC) CGM appears to be working for patient. Patient requests to continue Lantus 35 units and increase Novolog 12 units to twice daily from once daily. Given patient's increased blood sugar readings at home, Novolog to be increased to 12 units twice daily with largest meals. Patient has a postop appointment on 8/15 with orthopedics to follow up on his foot.    Breath Odor Suspect could be due to reflux. Patient encouraged to take Pepcid 20 mg twice daily as needed for indigestion. Refill for Pepcid to be sent to pharmacy. Patient encouraged to use mints and gum for breath odor as well.   FOLLOW UP: Patient to follow up on 8/26 for TII DM follow up.  Bubba Hales, Medical Student [facility]  Patient seen along with medical student Filomena Jungling. I personally evaluated this patient along with the student, and verified all aspects of the history, physical exam, and medical decision making as documented by the student. I agree with the student's documentation and have made all necessary edits.  Levert Feinstein, MD  [facility]

## 2023-02-06 NOTE — Patient Instructions (Signed)
It was great to see you again today.  Do novolog 12u twice daily with your largest meals Continue lantus 35u  Try pepcid 20mg  twice daily as needed for reflux or feeling like your food taste is remaining in your mouth  Follow up in a few weeks as scheduled  Be well, Dr. Pollie Meyer

## 2023-02-12 ENCOUNTER — Telehealth: Payer: Self-pay | Admitting: Orthopedic Surgery

## 2023-02-12 NOTE — Telephone Encounter (Signed)
Patient's wife called. Would like to know if patient could come in today instead of tomorrow. Says there's a lot of drainage. Cb# [phone]

## 2023-02-12 NOTE — Telephone Encounter (Signed)
I called and sw pt's wife and offered appt today. Said that they could not come in today but wanted an earlier appt for tomorrow he was sch at 1 pm and I moved to 10:45. This works well for the pt and they will call with any other questions.

## 2023-02-13 ENCOUNTER — Encounter: Payer: Self-pay | Admitting: Orthopedic Surgery

## 2023-02-13 ENCOUNTER — Encounter: Payer: 59 | Admitting: Orthopedic Surgery

## 2023-02-13 ENCOUNTER — Ambulatory Visit (INDEPENDENT_AMBULATORY_CARE_PROVIDER_SITE_OTHER): Payer: 59 | Admitting: Orthopedic Surgery

## 2023-02-13 DIAGNOSIS — Z89432 Acquired absence of left foot: Secondary | ICD-10-CM

## 2023-02-13 NOTE — Progress Notes (Signed)
Patient is a 67 year old gentleman who is status post left transmetatarsal amputation June 26.  He is currently wearing a 2 extra-large Vive sock.  Patient states he has had some drainage and swelling.  Examination there is swelling the incision is well opposed but no epithelialization.  There is no cellulitis or drainage.  We will leave the sutures in for 2 weeks continue with the compression sock.  Recommended floating the foot to keep pressure off the heel.  Currently no ulcers on the heel.

## 2023-02-13 NOTE — Progress Notes (Signed)
Patient is a 67 year old gentleman who is status post left transmetatarsal amputation June 24.  Patient has swelling.  Examination the wound is flat there is an area of granulation tissue that is 1 x 2 cm no active drainage.  Will harvest the sutures today patient will continue with Dial soap cleansing 4 x 4 and Ace wrap.  Discussed the difficulty of healing secondary to swelling.  Recommended elevating his foot level with his heart at all times.  There is no cellulitis no odor no drainage.  Patient's surgery was June 26.  Patient states that his surgery was in March.

## 2023-02-16 ENCOUNTER — Encounter (HOSPITAL_COMMUNITY): Payer: Self-pay

## 2023-02-16 ENCOUNTER — Ambulatory Visit (HOSPITAL_COMMUNITY)
Admission: EM | Admit: 2023-02-16 | Discharge: 2023-02-16 | Disposition: A | Discharge status: Home / Self Care | Payer: 59 | Attending: Internal Medicine | Admitting: Internal Medicine

## 2023-02-16 DIAGNOSIS — Z7984 Long term (current) use of oral hypoglycemic drugs: Secondary | ICD-10-CM | POA: Diagnosis not present

## 2023-02-16 DIAGNOSIS — J449 Chronic obstructive pulmonary disease, unspecified: Secondary | ICD-10-CM | POA: Diagnosis not present

## 2023-02-16 DIAGNOSIS — Z20828 Contact with and (suspected) exposure to other viral communicable diseases: Secondary | ICD-10-CM | POA: Diagnosis not present

## 2023-02-16 DIAGNOSIS — I130 Hypertensive heart and chronic kidney disease with heart failure and stage 1 through stage 4 chronic kidney disease, or unspecified chronic kidney disease: Secondary | ICD-10-CM | POA: Diagnosis not present

## 2023-02-16 DIAGNOSIS — Z20822 Contact with and (suspected) exposure to covid-19: Secondary | ICD-10-CM | POA: Diagnosis not present

## 2023-02-16 DIAGNOSIS — Z8249 Family history of ischemic heart disease and other diseases of the circulatory system: Secondary | ICD-10-CM | POA: Insufficient documentation

## 2023-02-16 DIAGNOSIS — Z87891 Personal history of nicotine dependence: Secondary | ICD-10-CM | POA: Diagnosis not present

## 2023-02-16 DIAGNOSIS — E1122 Type 2 diabetes mellitus with diabetic chronic kidney disease: Secondary | ICD-10-CM | POA: Diagnosis not present

## 2023-02-16 DIAGNOSIS — I509 Heart failure, unspecified: Secondary | ICD-10-CM | POA: Insufficient documentation

## 2023-02-16 DIAGNOSIS — E1151 Type 2 diabetes mellitus with diabetic peripheral angiopathy without gangrene: Secondary | ICD-10-CM | POA: Insufficient documentation

## 2023-02-16 DIAGNOSIS — Z833 Family history of diabetes mellitus: Secondary | ICD-10-CM | POA: Insufficient documentation

## 2023-02-16 DIAGNOSIS — N189 Chronic kidney disease, unspecified: Secondary | ICD-10-CM | POA: Diagnosis not present

## 2023-02-16 DIAGNOSIS — Z794 Long term (current) use of insulin: Secondary | ICD-10-CM | POA: Diagnosis not present

## 2023-02-16 DIAGNOSIS — Z1152 Encounter for screening for COVID-19: Secondary | ICD-10-CM

## 2023-02-16 NOTE — Discharge Instructions (Signed)
COVID testing is pending, staff will call you if this is positive. Wear a mask for 5 days of symptoms while you are in public, then you may remove your mask. You may go back to work if you do not have a fever for 24 hours without any medicines.  You may take OTC medications if you develop symptoms.  I recommend mucinex and tylenol as needed.  If you develop any new or worsening symptoms or if your symptoms do not start to improve, please return here or follow-up with your primary care provider. If your symptoms are severe, please go to the emergency room.

## 2023-02-16 NOTE — ED Triage Notes (Signed)
Patient not having any symptoms but his wife is sick.

## 2023-02-16 NOTE — ED Provider Notes (Signed)
[facility]    CSN: [id number] Arrival date & time: 02/16/23  1001      History   Chief Complaint Chief Complaint  Patient presents with   COVID Test    HPI Adam Ray is a 67 y.o. male.   Patient presents to urgent care for evaluation after exposure to his wife who is sick with COVID-19 symptoms.  Wife does not have known COVID-19 diagnosis, however is receiving testing today as well.  He is requesting a COVID-19 test as he is at risk for severe disease.  He denies cough, nasal congestion, fever/chills, nausea, vomiting, rash, sore throat, etc.  History of COPD, CHF, diabetes, CKD, and PAD.  No confirmed COVID-19 infection in the last 90 days.  Denies recent antibiotic or steroid use.  No shortness of breath, chest pain, or heart palpitations.     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   Status post transmetatarsal amputation of foot, 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/2018   STEMI involving left circumflex coronary artery (HCC) 07/12/2018   Coronary artery disease   Subacute osteomyelitis, right ankle and foot (HCC) 01/29/2016   Testicular mass 04/18/2016    Patient Active Problem List   Diagnosis Date Noted   Wound dehiscence, surgical, initial encounter 12/25/2022   Maggot infestation 12/25/2022   Abnormal bone radiograph 12/18/2022   Osteomyelitis of second toe of left foot (HCC) 10/11/2022   Osteomyelitis of great toe of left foot (HCC)  10/11/2022   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   Atrophy of tongue papillae 10/02/2022   Atherosclerosis of coronary artery without angina pectoris 10/02/2022   Age-related nuclear cataract of left eye 10/02/2022   Brown hairy tongue 10/02/2022   Proliferative diabetic retinopathy of right eye associated with type 2 diabetes mellitus (HCC) 10/02/2022   Tongue discoloration 02/14/2022   Hematuria 08/16/2021   Cervical spondylosis without myelopathy 12/14/2020   Pain in joint of left shoulder 12/14/2020   Urinary incontinence 08/10/2020   Increased urinary frequency 05/09/2020   Nocturnal leg cramps 10/28/2019   Hyperlipidemia associated with type 2 diabetes mellitus (HCC) 05/26/2019   Limited mobility 05/12/2018   Anxiety state 06/02/2017   Leg swelling 01/29/2017   Idiopathic chronic venous hypertension of both lower extremities with inflammation 09/24/2016   Diabetic polyneuropathy associated with type 2 diabetes mellitus (HCC)    Type 2 diabetes mellitus with neurologic complication, with long-term current use of insulin (HCC) 12/08/2015   Left shoulder pain 08/10/2015   Pulmonary nodule 02/01/2015   Depression 12/09/2013   Warts, genital 08/20/2013   Diabetic retinopathy (HCC) 01/28/2013   Sleep apnea 09/06/2010   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   Hypertension associated with diabetes (HCC) 11/09/2008   COPD, mild (HCC) 10/06/2006   SICKLE-CELL TRAIT  04/26/2006   ERECTILE DYSFUNCTION 04/26/2006   Tobacco use 04/26/2006   Peripheral arterial disease (HCC) 04/26/2006   Allergic rhinitis 04/26/2006    Past Surgical History:  Procedure Laterality Date   AMPUTATION Right 01/31/2016   Procedure: Right 2nd Toe Amputation;  Surgeon: [name], MD;  Location: [facility] OR;  Service: Orthopedics;  Laterality: Right;   AMPUTATION Right 07/08/2018   Procedure: RIGHT TRANSMETATARSAL AMPUTATION;  Surgeon: [name], MD;  Location: [facility] OR;   Service: Orthopedics;  Laterality: Right;   AMPUTATION Right 01/05/2020   Procedure: RIGHT BELOW KNEE AMPUTATION;  Surgeon: [name], MD;  Location: [facility] OR;  Service: Orthopedics;  Laterality: Right;   AMPUTATION Right 02/23/2020   Procedure: RIGHT BELOW KNEE AMPUTATION REVISION;  Surgeon: [name], MD;  Location: [facility] OR;  Service: Orthopedics;  Laterality: Right;   AMPUTATION Left 10/11/2022   Procedure: LEFT GREAT TOE AMPUTATION AND LEFT 2ND TOE AMPUTATION;  Surgeon: [name], MD;  Location: [facility] OR;  Service: Orthopedics;  Laterality: Left;   AMPUTATION Left 12/25/2022   Procedure: LEFT TRANSMETATARSAL AMPUTATION;  Surgeon: [name], MD;  Location: [facility] OR;  Service: Orthopedics;  Laterality: Left;   CATARACT EXTRACTION     right eye   COLONOSCOPY     CORONARY STENT INTERVENTION N/A 07/12/2018   Procedure: CORONARY STENT INTERVENTION;  Surgeon: Lennette Bihari, MD;  Location: [facility];  Service: Cardiovascular;  Laterality: N/A;   CORONARY/GRAFT ACUTE MI REVASCULARIZATION N/A 07/12/2018   Procedure: Coronary/Graft Acute MI Revascularization;  Surgeon: Lennette Bihari, MD;  Location: [facility];  Service: Cardiovascular;  Laterality: N/A;   I & D EXTREMITY  04/11/2012   Procedure: IRRIGATION AND DEBRIDEMENT EXTREMITY;  Surgeon: Toni Arthurs, MD;  Location: [facility] OR;  Service: Orthopedics;  Laterality: Right;   LEFT HEART CATH AND CORONARY ANGIOGRAPHY N/A 07/12/2018   Procedure: LEFT HEART CATH AND CORONARY ANGIOGRAPHY;  Surgeon: Lennette Bihari, MD;  [facility];  Service: Cardiovascular;  Laterality: N/A;   Surgery left great toe     Tear ducts bilateral eyes     TRANSMETATARSAL AMPUTATION Right 07/08/2018       Home Medications    Prior to Admission medications   Medication Sig Start Date End Date Taking? Authorizing Provider  aspirin 81 MG chewable tablet Chew 81 mg by mouth daily.   Yes [provider]  Cholecalciferol (VITAMIN  D3) 50 MCG (2000 UT) TABS Take 2,000 Units by mouth daily.   Yes [provider]  CINNAMON PO Take 1,200 mg by mouth 2 (two) times daily. [place]   Yes [provider]  diclofenac Sodium (VOLTAREN ARTHRITIS PAIN) 1 % GEL Apply 4 g topically 4 (four) times daily. 09/11/22  Yes Lockie Mola, MD  Dulaglutide (TRULICITY) 1.5 MG/0.5ML SOPN Inject 1.5 mg into the skin once a week. 02/14/22  Yes Hensel, Santiago Bumpers, MD  famotidine (PEPCID) 20 MG tablet Take 1 tablet (20 mg total) by mouth 2 (two) times daily as needed for heartburn or indigestion. 02/06/23  Yes Latrelle Dodrill, MD  furosemide (LASIX) 20 MG tablet Take 1 tablet (20 mg total) by mouth 2 (two) times daily. 07/26/22  Yes Runell Gess, MD  insulin aspart (NOVOLOG) 100 UNIT/ML injection Inject 12 Units into the skin daily before supper. Take with largest meal of day   Yes [provider]  insulin glargine (LANTUS SOLOSTAR) 100 UNIT/ML Solostar Pen Inject 35 Units  into the skin daily. 01/29/23  Yes Latrelle Dodrill, MD  metFORMIN (GLUCOPHAGE) 500 MG tablet TAKE 1 TABLET BY MOUTH 2 TIMES DAILY WITH A MEAL. Patient taking differently: Take 500 mg by mouth 2 (two) times daily with a meal. 06/29/21  Yes Westley Chandler, MD  metoprolol tartrate (LOPRESSOR) 25 MG tablet Take 25 mg by mouth 2 (two) times daily.   Yes [provider]  Multiple Vitamin (MULTIVITAMIN WITH MINERALS) TABS tablet Take 1 tablet by mouth daily.  iron   Yes [provider]  rosuvastatin (CRESTOR) 40 MG tablet Take 1 tablet (40 mg total) by mouth daily at 6 PM. Patient taking differently: Take 20 mg by mouth daily at 6 PM. 05/15/20  Yes Mirian Mo, MD  sildenafil (VIAGRA) 100 MG tablet Take 100 mg by mouth daily as needed for erectile dysfunction.   Yes [provider]  tamsulosin (FLOMAX) 0.4 MG CAPS capsule Take 1 capsule (0.4 mg total) by mouth daily. 11/09/21  Yes Latrelle Dodrill, MD  tolterodine (DETROL LA) 4  MG 24 hr capsule Take 4 mg by mouth daily. 10/13/22  Yes [provider]  Vibegron (GEMTESA) 75 MG TABS Take 75 mg by mouth daily.   Yes [provider]  acetaminophen (TYLENOL) 500 MG tablet Take 1,500 mg by mouth every 6 (six) hours as needed for moderate pain, mild pain or headache.    [provider]  Continuous Glucose Receiver (FREESTYLE LIBRE 3 READER) DEVI 1 each by Does not apply route as directed. Use with sensor to monitor blood sugar 11/26/22   McDiarmid, Leighton Roach, MD  Continuous Glucose Sensor (FREESTYLE LIBRE 3 SENSOR) MISC 1 each by Does not apply route every 14 (fourteen) days. Place 1 sensor on the skin every 14 days. Use to check glucose continuously 01/21/23   McDiarmid, Leighton Roach, MD  doxycycline (VIBRA-TABS) 100 MG tablet Take 1 tablet (100 mg total) by mouth 2 (two) times daily. Patient not taking: Reported on 02/06/2023 01/13/23   [name], MD  levofloxacin (LEVAQUIN) 750 MG tablet Take 1 tablet (750 mg total) by mouth daily. For 14 days 07/07/12 07/24/12  Madolyn Frieze, Etta Quill, MD    Family History Family History  Problem Relation Age of Onset   Diabetes Mother    Stroke Mother    Heart failure Father    Heart attack Father    Diabetes Sister        multiple siblings   Diabetes Brother        muliple siblings   Heart disease Brother    Colon cancer Neg Hx    Esophageal cancer Neg Hx    Rectal cancer Neg Hx    Stomach cancer Neg Hx     Social History Social History   Tobacco Use   Smoking status: Former    Current packs/day: 0.00    Average packs/day: 0.3 packs/day for 55.9 years (16.8 ttl pk-yrs)    Types: Cigars, Cigarettes    Start date: 07/02/1963    Quit date: 06/01/2019    Years since quitting: 3.7   Smokeless tobacco: Former  Building services engineer status: Former  Substance Use Topics   Alcohol use: Not Currently   Drug use: No     Allergies   Codeine, Latex, Morphine, and Propofol   Review of Systems Review of Systems Per  HPI  Physical Exam Triage Vital Signs ED Triage Vitals  Encounter Vitals Group     BP  02/16/23 1025 105/69     Systolic BP Percentile --      Diastolic BP Percentile --      Pulse Rate 02/16/23 1025 68     Resp 02/16/23 1025 18     Temp 02/16/23 1025 98.6 F (37 C)     Temp Source 02/16/23 1025 Oral     SpO2 02/16/23 1025 93 %     Weight 02/16/23 1025 164 lb 0.4 oz (74.4 kg)     Height 02/16/23 1025 5\' 10"  (1.778 m)     Head Circumference --      Peak Flow --      Pain Score 02/16/23 1023 0     Pain Loc --      Pain Education --      Exclude from Growth Chart --    No data found.  Updated Vital Signs BP 105/69 (BP Location: Right Arm)   Pulse 68   Temp 98.6 F (37 C) (Oral)   Resp 18   Ht 5\' 10"  (1.778 m)   Wt 164 lb 0.4 oz (74.4 kg)   SpO2 93%   BMI 23.53 kg/m   Visual Acuity Right Eye Distance:   Left Eye Distance:   Bilateral Distance:    Right Eye Near:   Left Eye Near:    Bilateral Near:     Physical Exam Vitals and nursing note reviewed.  Constitutional:      Appearance: He is not ill-appearing or toxic-appearing.  HENT:     Head: Normocephalic and atraumatic.     Right Ear: Hearing and external ear normal.     Left Ear: Hearing and external ear normal.     Nose: Nose normal.     Mouth/Throat:     Lips: Pink.  Eyes:     General: Lids are normal. Vision grossly intact. Gaze aligned appropriately.     Extraocular Movements: Extraocular movements intact.     Conjunctiva/sclera: Conjunctivae normal.  Cardiovascular:     Rate and Rhythm: Normal rate and regular rhythm.     Heart sounds: Normal heart sounds, S1 normal and S2 normal.  Pulmonary:     Effort: Pulmonary effort is normal. No respiratory distress.     Breath sounds: Normal breath sounds and air entry.  Musculoskeletal:     Cervical back: Neck supple.  Skin:    General: Skin is warm and dry.     Capillary Refill: Capillary refill takes less than 2 seconds.     Findings: No rash.   Neurological:     General: No focal deficit present.     Mental Status: He is alert and oriented to person, place, and time. Mental status is at baseline.     Cranial Nerves: No dysarthria or facial asymmetry.  Psychiatric:        Mood and Affect: Mood normal.        Speech: Speech normal.        Behavior: Behavior normal.        Thought Content: Thought content normal.        Judgment: Judgment normal.      UC Treatments / Results  Labs (all labs ordered are listed, but only abnormal results are displayed) Labs Reviewed  SARS CORONAVIRUS 2 (TAT 6-24 HRS)    EKG   Radiology No results found.  Procedures Procedures (including critical care time)  Medications Ordered in UC Medications - No data to display  Initial Impression / Assessment and Plan / UC  Course  I have reviewed the triage vital signs and the nursing notes.  Pertinent labs & imaging results that were available during my care of the patient were reviewed by me and considered in my medical decision making (see chart for details).   1.  Encounter for screening for COVID-19, contact with or exposure to viral disease COVID-19 testing is pending, CDC guidelines discussed.  Patient is a candidate for molnupiravir should he test positive for COVID-19.  Recommend over-the-counter Tylenol and guaifenesin should he develop any symptoms.  Advised to return to urgent care for evaluation if he develops any concerning/red flag symptoms such as shortness of breath, wheezing, etc. to rule out COPD exacerbation as he is at risk for significant illness with COVID-19.  Counseled patient on potential for adverse effects with medications prescribed/recommended today, strict ER and return-to-clinic precautions discussed, patient verbalized understanding.    Final Clinical Impressions(s) / UC Diagnoses   Final diagnoses:  Encounter for screening for COVID-19  Contact with or exposure to viral disease     Discharge  Instructions      COVID testing is pending, staff will call you if this is positive. Wear a mask for 5 days of symptoms while you are in public, then you may remove your mask. You may go back to work if you do not have a fever for 24 hours without any medicines.  You may take OTC medications if you develop symptoms.  I recommend mucinex and tylenol as needed.  If you develop any new or worsening symptoms or if your symptoms do not start to improve, please return here or follow-up with your primary care provider. If your symptoms are severe, please go to the emergency room.    ED Prescriptions   None    PDMP not reviewed this encounter.   Carlisle Beers, FNP 02/16/23 1100

## 2023-02-17 DIAGNOSIS — E119 Type 2 diabetes mellitus without complications: Secondary | ICD-10-CM | POA: Diagnosis not present

## 2023-02-17 LAB — SARS CORONAVIRUS 2 (TAT 6-24 HRS): SARS Coronavirus 2: NEGATIVE

## 2023-02-18 ENCOUNTER — Telehealth: Payer: Self-pay

## 2023-02-18 ENCOUNTER — Encounter: Payer: Self-pay | Admitting: Hematology

## 2023-02-18 ENCOUNTER — Inpatient Hospital Stay (HOSPITAL_BASED_OUTPATIENT_CLINIC_OR_DEPARTMENT_OTHER): Payer: 59 | Admitting: Hematology

## 2023-02-18 DIAGNOSIS — M899 Disorder of bone, unspecified: Secondary | ICD-10-CM

## 2023-02-18 DIAGNOSIS — D519 Vitamin B12 deficiency anemia, unspecified: Secondary | ICD-10-CM | POA: Diagnosis not present

## 2023-02-18 DIAGNOSIS — D61818 Other pancytopenia: Secondary | ICD-10-CM

## 2023-02-18 NOTE — Progress Notes (Signed)
[facility]   Telephone:(336) [phone] Fax:(336) [phone]   Clinic Follow up Note   Patient Care Team: Latrelle Dodrill, MD as PCP - General (Family Medicine) Runell Gess, MD as PCP - Cardiology (Cardiology)  Date of Service:  02/18/2023  I connected with [name] on 02/18/2023 at  2:40 PM EDT by telephone visit and verified that I am speaking with the correct person using two identifiers.  I discussed the limitations, risks, security and privacy concerns of performing an evaluation and management service by telephone and the availability of in person appointments. I also discussed with the patient that there may be a patient responsible charge related to this service. The patient expressed understanding and agreed to proceed.   Other persons participating in the visit and their role in the encounter:  wife  Patient's location:  Home Provider's location:  [facility] Office  CHIEF COMPLAINT: f/u of Pancytopenia and bone lesions   CURRENT THERAPY: pending B12 injection monthly   ASSESSMENT & PLAN:  Adam Ray is a 67 y.o. male with    Pancytopenia (HCC) -He had mild chronic anemia in the past, but got much worse lately, also developed a mild neutropenia and thrombocytopenia in the past year.  -Anemia workup in early August 2024 showed normal iron study, folate level, B12 level was slightly low 221, normal immature platelet, episodes of reticulocyte count 88, his anemia improved hemoglobin 11.7, mild leukopenia and thrombocytopenia. -I recommend him to start oral B12 1000 mcg daily, and will give B12 injection monthly for 3 months, to see if his cytopenias improved.  Given the spontaneous improvement of hemoglobin, I will hold on bone marrow biopsy for now.  Bone lesion -Stable, he is not symptomatic, probably benign. -His multiple myeloma panel were negative -Repeated PSA in August 2024 was undetectable -I did not think he needed a bone biopsy.  PLAN: -Lab  reviewed hg -B12 low, I recommend  B12 injection in 2 wks monthly -PSA-normal -I recommend Oral B12 as well. -lab and f/u in 2.5 months    INTERVAL HISTORY:  JEREMI KRESS was contacted for a follow up of Pancytopenia and bone lesions .He was last seen by me on 02/01/2023.  Pt state that he was in urgent care for Covid screening and it was negative.   All other systems were reviewed with the patient and are negative.  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   Status post transmetatarsal amputation of foot, 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/2018   STEMI involving left circumflex coronary artery (HCC) 07/12/2018   Coronary artery disease   Subacute osteomyelitis, right ankle and foot (HCC) 01/29/2016   Testicular mass 04/18/2016    SURGICAL HISTORY: Past Surgical History:  Procedure Laterality Date   AMPUTATION  Right 01/31/2016   Procedure: Right 2nd Toe Amputation;  Surgeon: [name], MD;  Location: [facility] OR;  Service: Orthopedics;  Laterality: Right;   AMPUTATION Right 07/08/2018   Procedure: RIGHT TRANSMETATARSAL AMPUTATION;  Surgeon: [name], MD;  Location: [facility] OR;  Service: Orthopedics;  Laterality: Right;   AMPUTATION Right 01/05/2020   Procedure: RIGHT BELOW KNEE AMPUTATION;  Surgeon: [name], MD;  Location: [facility] OR;  Service: Orthopedics;  Laterality: Right;   AMPUTATION Right 02/23/2020   Procedure: RIGHT BELOW KNEE AMPUTATION REVISION;   Surgeon: [name], MD;  Location: [facility] OR;  Service: Orthopedics;  Laterality: Right;   AMPUTATION Left 10/11/2022   Procedure: LEFT GREAT TOE AMPUTATION AND LEFT 2ND TOE AMPUTATION;  Surgeon: [name], MD;  Location: [facility] OR;  Service: Orthopedics;  Laterality: Left;   AMPUTATION Left 12/25/2022   Procedure: LEFT TRANSMETATARSAL AMPUTATION;  Surgeon: [name], MD;  Location: [facility] OR;  Service: Orthopedics;  Laterality: Left;   CATARACT EXTRACTION     right eye   COLONOSCOPY     CORONARY STENT INTERVENTION N/A 07/12/2018   Procedure: CORONARY STENT INTERVENTION;  Surgeon: Lennette Bihari, MD;  Location: [facility] INVASIVE CV LAB;  Service: Cardiovascular;  Laterality: N/A;   CORONARY/GRAFT ACUTE MI REVASCULARIZATION N/A 07/12/2018   Procedure: Coronary/Graft Acute MI Revascularization;  Surgeon: Lennette Bihari, MD;  Location: [facility] INVASIVE CV LAB;  Service: Cardiovascular;  Laterality: N/A;   I & D EXTREMITY  04/11/2012   Procedure: IRRIGATION AND DEBRIDEMENT EXTREMITY;  Surgeon: Toni Arthurs, MD;  Location: [facility] OR;  Service: Orthopedics;  Laterality: Right;   LEFT HEART CATH AND CORONARY ANGIOGRAPHY N/A 07/12/2018   Procedure: LEFT HEART CATH AND CORONARY ANGIOGRAPHY;  Surgeon: Lennette Bihari, MD;  Location: [facility] INVAS[facility] CV LAB;  Service: Cardiovascular;  Laterality: N/A;   Surgery left great toe     Tear ducts bilateral eyes     TRANSMETATARSAL AMPUTATION Right 07/08/2018    I have reviewed the social history and family history with the patient and they are unchanged from previous note.  ALLERGIES:  is allergic to codeine, latex, morphine, and propofol.  MEDICATIONS:  Current Outpatient Medications  Medication Sig Dispense Refill   acetaminophen (TYLENOL) 500 MG tablet Take 1,500 mg by mouth every 6 (six) hours as needed for moderate pain, mild pain or headache.     aspirin 81 MG chewable tablet Chew 81 mg by mouth daily.     Cholecalciferol (VITAMIN D3) 50 MCG (2000 UT) TABS Take 2,000  Units by mouth daily.     CINNAMON PO Take 1,200 mg by mouth 2 (two) times daily. [place]     Continuous Glucose Receiver (FREESTYLE LIBRE 3 READER) DEVI 1 each by Does not apply route as directed. Use with sensor to monitor blood sugar 1 each 0   Continuous Glucose Sensor (FREESTYLE LIBRE 3 SENSOR) MISC 1 each by Does not apply route every 14 (fourteen) days. Place 1 sensor on the skin every 14 days. Use to check glucose continuously 2 each 11   diclofenac Sodium (VOLTAREN ARTHRITIS PAIN) 1 % GEL Apply 4 g topically 4 (four) times daily. 50 g 3   doxycycline (VIBRA-TABS) 100 MG tablet Take 1 tablet (100 mg total) by mouth 2 (two) times daily. (Patient not taking: Reported on 02/06/2023) 30 tablet 0   Dulaglutide (TRULICITY) 1.5 MG/0.5ML SOPN Inject 1.5 mg into the skin once a week. 2 mL 3   famotidine (PEPCID) 20 MG  tablet Take 1 tablet (20 mg total) by mouth 2 (two) times daily as needed for heartburn or indigestion. 60 tablet 1   furosemide (LASIX) 20 MG tablet Take 1 tablet (20 mg total) by mouth 2 (two) times daily. 180 tablet 3   insulin aspart (NOVOLOG) 100 UNIT/ML injection Inject 12 Units into the skin daily before supper. Take with largest meal of day     insulin glargine (LANTUS SOLOSTAR) 100 UNIT/ML Solostar Pen Inject 35 Units into the skin daily.     metFORMIN (GLUCOPHAGE) 500 MG tablet TAKE 1 TABLET BY MOUTH 2 TIMES DAILY WITH A MEAL. (Patient taking differently: Take 500 mg by mouth 2 (two) times daily with a meal.) 180 tablet 1   metoprolol tartrate (LOPRESSOR) 25 MG tablet Take 25 mg by mouth 2 (two) times daily.     Multiple Vitamin (MULTIVITAMIN WITH MINERALS) TABS tablet Take 1 tablet by mouth daily.  iron     rosuvastatin (CRESTOR) 40 MG tablet Take 1 tablet (40 mg total) by mouth daily at 6 PM. (Patient taking differently: Take 20 mg by mouth daily at 6 PM.)     sildenafil (VIAGRA) 100 MG tablet Take 100 mg by mouth daily as needed for erectile dysfunction.     tamsulosin (FLOMAX)  0.4 MG CAPS capsule Take 1 capsule (0.4 mg total) by mouth daily. 30 capsule 3   tolterodine (DETROL LA) 4 MG 24 hr capsule Take 4 mg by mouth daily.     Vibegron (GEMTESA) 75 MG TABS Take 75 mg by mouth daily.     No current facility-administered medications for this visit.    PHYSICAL EXAMINATION: ECOG PERFORMANCE STATUS: 1 - Symptomatic but completely ambulatory  There were no vitals filed for this visit. Wt Readings from Last 3 Encounters:  02/16/23 164 lb 0.4 oz (74.4 kg)  01/27/23 164 lb (74.4 kg)  01/16/23 166 lb 6.4 oz (75.5 kg)     No vitals taken today, Exam not performed today  LABORATORY DATA:  I have reviewed the data as listed    Latest Ref Rng & Units 02/01/2023    9:21 AM 12/27/2022   11:42 PM 12/27/2022    7:37 PM  CBC  WBC 4.0 - 10.5 K/uL 3.7   2.9   Hemoglobin 13.0 - 17.0 g/dL 69.6  8.8  8.3   Hematocrit 39.0 - 52.0 % 35.0  26.0  25.4   Platelets 150 - 400 K/uL 123   104         Latest Ref Rng & Units 12/27/2022   11:42 PM 12/27/2022    7:37 PM 12/16/2022    5:12 PM  CMP  Glucose 70 - 99 mg/dL  295  284   BUN 8 - 23 mg/dL  16  16   Creatinine 1.32 - 1.24 mg/dL  4.40  1.02   Sodium 725 - 145 mmol/L 134  132  141   Potassium 3.5 - 5.1 mmol/L 3.6  3.5  4.0   Chloride 98 - 111 mmol/L  103  102   CO2 22 - 32 mmol/L  22  26   Calcium 8.9 - 10.3 mg/dL  7.1  9.0   Total Protein 6.0 - 8.5 g/dL   6.2   Total Bilirubin 0.0 - 1.2 mg/dL   0.3   Alkaline Phos 44 - 121 IU/L   92   AST 0 - 40 IU/L   25   ALT 0 - 44 IU/L   20  RADIOGRAPHIC STUDIES: I have personally reviewed the radiological images as listed and agreed with the findings in the report. No results found.    Orders Placed This Encounter  Procedures   Intrinsic factor antibodies    Standing Status:   Future    Standing Expiration Date:   02/18/2024   All questions were answered. The patient knows to call the clinic with any problems, questions or concerns. No barriers to learning was  detected. The total time spent in the appointment was 15 minutes.     Malachy Mood, MD 02/18/2023   Carolin Coy am acting as scribe for Malachy Mood, MD.   I have reviewed the above documentation for accuracy and completeness, and I agree with the above.

## 2023-02-18 NOTE — Assessment & Plan Note (Signed)
-  He had mild chronic anemia in the past, but got much worse lately, also developed a mild neutropenia and thrombocytopenia in the past year.  -Anemia workup in early August 2024 showed normal iron study, folate level, B12 level was slightly low 221, normal immature platelet, episodes of reticulocyte count 88, his anemia improved hemoglobin 11.7, mild leukopenia and thrombocytopenia. -I recommend him to start oral B12 1000 mcg daily, and will give B12 injection monthly for 3 months, to see if his cytopenias improved.  Given the spontaneous improvement of hemoglobin, I will hold on bone marrow biopsy for now.

## 2023-02-18 NOTE — Assessment & Plan Note (Signed)
-  Stable, he is not symptomatic, probably benign. -His multiple myeloma panel were negative -Repeated PSA in August 2024 was undetectable -I did not think he needed a bone biopsy.

## 2023-02-18 NOTE — Telephone Encounter (Signed)
Will need to contact the pt and have him reach out to his PCP for [facility] auth to treat his left foot. This Adam Ray was apparently not in place and will need then to submit request in order for [facility] to approve continued treatment.

## 2023-02-19 NOTE — Telephone Encounter (Signed)
I called pt and advised of message below and he and his wife advised that they would call.

## 2023-02-20 ENCOUNTER — Telehealth: Payer: Self-pay | Admitting: Hematology

## 2023-02-24 ENCOUNTER — Ambulatory Visit: Payer: No Typology Code available for payment source | Admitting: Family Medicine

## 2023-03-01 ENCOUNTER — Other Ambulatory Visit: Payer: Self-pay | Admitting: Family Medicine

## 2023-03-04 ENCOUNTER — Inpatient Hospital Stay: Payer: Medicaid Other | Attending: Hematology

## 2023-03-10 ENCOUNTER — Telehealth: Payer: Self-pay

## 2023-03-10 ENCOUNTER — Encounter: Payer: Self-pay | Admitting: Hematology

## 2023-03-10 DIAGNOSIS — M7989 Other specified soft tissue disorders: Secondary | ICD-10-CM

## 2023-03-10 NOTE — Telephone Encounter (Signed)
Caller: Pt's wife, Marylene Land (verbal consent given by pt)  Concern: LE swelling, pain, intermittent pus drainage from amputation  Location: left leg  Description:  off & on for months  Aggravating Factors: movement, walking  Treatments:  Instructed pt on proper elevation, pt wears compression, but states it doesn't do much most of the time  Resolution: Appointment scheduled as a non-urgent triage working with [facility] and pt's wife's work schedule  Next Appt: Appointment scheduled for 9/30 @ 1430 for LE reflux and 1515 PA

## 2023-03-13 ENCOUNTER — Encounter: Payer: Self-pay | Admitting: Student

## 2023-03-13 ENCOUNTER — Ambulatory Visit (INDEPENDENT_AMBULATORY_CARE_PROVIDER_SITE_OTHER): Payer: 59 | Admitting: Student

## 2023-03-13 VITALS — BP 113/68 | HR 78

## 2023-03-13 DIAGNOSIS — R011 Cardiac murmur, unspecified: Secondary | ICD-10-CM

## 2023-03-13 DIAGNOSIS — M7989 Other specified soft tissue disorders: Secondary | ICD-10-CM

## 2023-03-13 NOTE — Patient Instructions (Addendum)
It was great to see you! Thank you for allowing me to participate in your care!  I recommend that you always bring your medications to each appointment as this makes it easy to ensure we are on the correct medications and helps [place] not miss when refills are needed.  Our plans for today:  - Leg Swelling This is likely coming from your Venous Insufficiency. We will check some labs to be sure nothing else is going on. Be sure to elevate your foot, when you are sitting Continue to wear compression leggings/socks *Seek medical care if:  You develop redness, pain, or warmth to leg swelling  - Heart Murmur  I want you to follow up with your doctor in 2-4 weeks about this heart murmur.   We are checking some labs today, I will call you if they are abnormal will send you a MyChart message or a letter if they are normal.  If you do not hear about your labs in the next 2 weeks please let us know.  Take care and seek immediate care sooner if you develop any concerns.   Dr. Bess Kinds, MD [facility]

## 2023-03-13 NOTE — Progress Notes (Signed)
  SUBJECTIVE:   CHIEF COMPLAINT / HPI:   Left leg Issues Leg has been swollen for last 6 months, this has happened in the past, and he's wrapped his leg for it, to help with the swelling. Sometime leg will hurt w/ the swelling if it get's too big. Leg doesn't get warm or red. Wife note's that patient does not elevate his leg. Patient was told this is coming from Venous Insufficiency, but is wondering if it will ever improve.   PERTINENT  PMH / PSH:    OBJECTIVE:  BP 113/68   Pulse 78   SpO2 98%  Physical Exam Constitutional:      General: He is not in acute distress.    Appearance: Normal appearance. He is not ill-appearing.  Musculoskeletal:     Left lower leg: No deformity, lacerations, tenderness or bony tenderness. 3+ Pitting Edema present.  Neurological:     Mental Status: He is alert.      ASSESSMENT/PLAN:  Leg swelling Assessment & Plan: Patient comes in for continued concern of his lower left extremity swelling.  Patient was told that he has venous insufficiency in the past however he was wondering if this would ever improve.  Patient does wear compression leggings, but does not elevate his leg at baseline.  Patient denies any tenderness or redness with leg swelling, and no lesion, erythema, tenderness on exam today low concern for DVT, or infection.  Patient reports his breathing is fine and gone per usual, low concern for CHF, patient has BKA of right leg thus cannot assess volume status. Patient report's breathing is going fine, per normal, with normal lung exam.   -Wear compression leggings -Elevate feet when seated  Orders: -     CBC -     Vitamin B12 -     Folate -     Iron, TIBC and Ferritin Panel  Heart murmur, systolic Assessment & Plan: Detected heart murmur while doing lung exam on patient.  Heart murmur 3 out of 6, systolic.  Will check patient for anemia and have patient follow-up with PCP.  May consider echocardiogram in future.  Patient notes he has an  appointment with cardiology coming up.  Will have patient follow-up with PCP after cardiology. - CBC, B12, folate, iron studies - Follow-up with PCP in 2 to 4 weeks  Orders: -     CBC -     Vitamin B12 -     Folate -     Iron, TIBC and Ferritin Panel   No follow-ups on file. Bess Kinds, MD 03/13/2023, 3:18 PM PGY-3, [facility]

## 2023-03-13 NOTE — Assessment & Plan Note (Signed)
Patient comes in for continued concern of his lower left extremity swelling.  Patient was told that he has venous insufficiency in the past however he was wondering if this would ever improve.  Patient does wear compression leggings, but does not elevate his leg at baseline.  Patient denies any tenderness or redness with leg swelling, and no lesion, erythema, tenderness on exam today low concern for DVT, or infection.  Patient reports his breathing is fine and gone per usual, low concern for CHF, patient has BKA of right leg thus cannot assess volume status. Patient report's breathing is going fine, per normal, with normal lung exam.   -Wear compression leggings -Elevate feet when seated

## 2023-03-13 NOTE — Assessment & Plan Note (Addendum)
Detected heart murmur while doing lung exam on patient.  Heart murmur 3 out of 6, systolic.  Will check patient for anemia and have patient follow-up with PCP.  May consider echocardiogram in future.  Patient notes he has an appointment with cardiology coming up.  Will have patient follow-up with PCP after cardiology. - CBC, B12, folate, iron studies - Follow-up with PCP in 2 to 4 weeks

## 2023-03-14 ENCOUNTER — Encounter: Payer: Self-pay | Admitting: Student

## 2023-03-14 LAB — CBC
Hematocrit: 34.2 % — ABNORMAL LOW (ref 37.5–51.0)
Hemoglobin: 11 g/dL — ABNORMAL LOW (ref 13.0–17.7)
MCH: 26.7 pg (ref 26.6–33.0)
MCHC: 32.2 g/dL (ref 31.5–35.7)
MCV: 83 fL (ref 79–97)
Platelets: 134 10*3/uL — ABNORMAL LOW (ref 150–450)
RBC: 4.12 x10E6/uL — ABNORMAL LOW (ref 4.14–5.80)
RDW: 13.9 % (ref 11.6–15.4)
WBC: 3.3 10*3/uL — ABNORMAL LOW (ref 3.4–10.8)

## 2023-03-14 LAB — IRON,TIBC AND FERRITIN PANEL
Ferritin: 163 ng/mL (ref 30–400)
Iron Saturation: 20 % (ref 15–55)
Iron: 49 ug/dL (ref 38–169)
Total Iron Binding Capacity: 245 ug/dL — ABNORMAL LOW (ref 250–450)
UIBC: 196 ug/dL (ref 111–343)

## 2023-03-14 LAB — VITAMIN B12: Vitamin B-12: 276 pg/mL (ref 232–1245)

## 2023-03-14 LAB — FOLATE: Folate: >20 ng/mL (ref >3.0)

## 2023-03-17 ENCOUNTER — Ambulatory Visit: Payer: No Typology Code available for payment source | Admitting: Family Medicine

## 2023-03-18 ENCOUNTER — Ambulatory Visit (INDEPENDENT_AMBULATORY_CARE_PROVIDER_SITE_OTHER): Payer: 59 | Admitting: Orthopedic Surgery

## 2023-03-18 DIAGNOSIS — Z89432 Acquired absence of left foot: Secondary | ICD-10-CM | POA: Diagnosis not present

## 2023-03-18 DIAGNOSIS — T8781 Dehiscence of amputation stump: Secondary | ICD-10-CM

## 2023-03-18 DIAGNOSIS — I872 Venous insufficiency (chronic) (peripheral): Secondary | ICD-10-CM

## 2023-03-18 DIAGNOSIS — I890 Lymphedema, not elsewhere classified: Secondary | ICD-10-CM

## 2023-03-18 DIAGNOSIS — I87323 Chronic venous hypertension (idiopathic) with inflammation of bilateral lower extremity: Secondary | ICD-10-CM

## 2023-03-19 ENCOUNTER — Encounter: Payer: Self-pay | Admitting: Orthopedic Surgery

## 2023-03-19 NOTE — Progress Notes (Signed)
Office Visit Note   Patient: Adam Ray           Date of Birth: [birth year]           MRN: [id number] Visit Date: 03/18/2023              Requested by: Latrelle Dodrill, MD [address] [place],  [place] [address] PCP: Latrelle Dodrill, MD  Chief Complaint  Patient presents with   Left Foot - Routine Post Op    12/23/22 left TMA      HPI: Patient is a 67 year old gentleman who is status post left transmetatarsal amputation approximately 3 months ago.  Patient is currently using gauze on the surface of the wound and then a Vive compression sock.  Assessment & Plan: Visit Diagnoses:  1. History of transmetatarsal amputation of left foot (HCC)   2. Dehiscence of amputation stump of left lower extremity (HCC)     Plan: Ulcer was debrided back to healthy viable granulation tissue.  Continue with Dial soap cleansing dry dressing changes and a compression sock.  Minimize weightbearing.  With the increased venous swelling discussed the importance of elevation.  Follow-Up Instructions: Return in about 4 weeks (around 04/15/2023).   Ortho Exam  Patient is alert, oriented, no adenopathy, well-dressed, normal affect, normal respiratory effort. Examination patient has increased swelling with pitting edema of the left lower extremity.  There are no open ulcers in the leg.  Examination of the foot patient has a ulcer on the plantar aspect.  After informed consent a 10 blade knife was used to debride the skin and soft tissue back to healthy viable tissue.  The wound measures 2 x 3 cm after debridement and 1 mm deep.  There is no tunneling no exposed bone or tendon there is 100% flat healthy granulation tissue.  Imaging: No results found. No images are attached to the encounter.  Labs: Lab Results  Component Value Date   HGBA1C 10.3 (A) 02/06/2023   HGBA1C 6.6 11/18/2022   HGBA1C 7.9 (A) 07/05/2022   ESRSEDRATE 54 (H) 01/01/2020   ESRSEDRATE 8 01/29/2016   ESRSEDRATE  9 04/11/2012   CRP 2.6 (H) 01/29/2016   CRP 0.5 02/28/2014   REPTSTATUS 01/18/2020 FINAL 01/17/2020   REPTSTATUS 01/22/2020 FINAL 01/17/2020   REPTSTATUS 01/22/2020 FINAL 01/17/2020   GRAMSTAIN  04/11/2012    NO WBC SEEN RARE SQUAMOUS EPITHELIAL CELLS PRESENT MODERATE GRAM POSITIVE COCCI IN PAIRS RARE GRAM NEGATIVE RODS   GRAMSTAIN  04/11/2012    NO WBC SEEN RARE SQUAMOUS EPITHELIAL CELLS PRESENT MODERATE GRAM POSITIVE COCCI IN PAIRS RARE GRAM NEGATIVE RODS   CULT (A) 01/17/2020    <10,000 COLONIES/mL INSIGNIFICANT GROWTH Performed at [facility], [address], [place], [place] [address]    CULT  01/17/2020    NO GROWTH 5 DAYS Performed at [facility], [address], [place], [place] [address]    CULT  01/17/2020    NO GROWTH 5 DAYS Performed at [facility], [address] [place], [place] [address]    LABORGA NO GROWTH 2 DAYS 07/19/2014     Lab Results  Component Value Date   ALBUMIN 4.1 12/16/2022   ALBUMIN 4.5 01/17/2022   ALBUMIN 4.1 12/06/2020    Lab Results  Component Value Date   MG 1.5 (L) 12/27/2022   No results found for: "VD25OH"  No results found for: "PREALBUMIN"    Latest Ref Rng & Units 03/13/2023  5:13 PM 02/01/2023    9:21 AM 12/27/2022   11:42 PM  CBC EXTENDED  WBC 3.4 - 10.8 x10E3/uL 3.3  3.7    RBC 4.14 - 5.80 x10E6/uL 4.12  4.27    4.25    Hemoglobin 13.0 - 17.7 g/dL 40.9  81.1  8.8   HCT 91.4 - 51.0 % 34.2  35.0  26.0   Platelets 150 - 450 x10E3/uL 134  123    NEUT# 1.7 - 7.7 K/uL  1.7    Lymph# 0.7 - 4.0 K/uL  1.4       There is no height or weight on file to calculate BMI.  Orders:  No orders of the defined types were placed in this encounter.  No orders of the defined types were placed in this encounter.    Procedures: No procedures performed  Clinical Data: No additional findings.  ROS:  All other systems negative, except as noted in the HPI. Review of Systems  Objective: Vital Signs:  There were no vitals taken for this visit.  Specialty Comments:  No specialty comments available.  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   Atrophy of tongue papillae 10/02/2022   Atherosclerosis of coronary artery without angina pectoris 10/02/2022   Age-related nuclear cataract of left eye 10/02/2022   Manson Passey hairy tongue 10/02/2022   Proliferative diabetic retinopathy of right eye associated with type 2 diabetes mellitus (HCC) 10/02/2022   Tongue discoloration 02/14/2022   Hematuria 08/16/2021   Cervical spondylosis without myelopathy 12/14/2020   Pain in joint of left shoulder 12/14/2020   Urinary incontinence 08/10/2020   Increased urinary frequency 05/09/2020   Nocturnal leg cramps 10/28/2019   Hyperlipidemia associated with type 2 diabetes mellitus (HCC) 05/26/2019   Limited mobility 05/12/2018   Anxiety state 06/02/2017   Leg swelling 01/29/2017   Idiopathic chronic venous hypertension of both lower extremities with inflammation 09/24/2016   Diabetic polyneuropathy associated with type 2 diabetes mellitus (HCC)    Type 2 diabetes mellitus with neurologic complication, with long-term current use of insulin (HCC) 12/08/2015   Left shoulder pain 08/10/2015   Pulmonary nodule 02/01/2015   Depression 12/09/2013   Warts, genital 08/20/2013   Diabetic retinopathy (HCC) 01/28/2013   Sleep apnea 09/06/2010   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   Hypertension associated with diabetes (HCC) 11/09/2008    COPD, mild (HCC) 10/06/2006   SICKLE-CELL TRAIT 04/26/2006   ERECTILE DYSFUNCTION 04/26/2006   Tobacco use 04/26/2006   Peripheral arterial disease (HCC) 04/26/2006   Allergic rhinitis 04/26/2006   Past Medical History:  Diagnosis Date   Allergy    Arthritis    Bronchitis    Cataract    CHF (congestive heart failure) (HCC)    Chronic kidney disease    Claudication (HCC)    right foot ray resection   Colon polyps    hyperplastic   COPD (chronic obstructive pulmonary disease) (HCC)    Coronary artery disease    Diabetes mellitus    type II   Genital warts  Gout    Hyperlipidemia    Hypertension    Myocardial infarction [facility]    Osteomyelitis of third toe of right foot (HCC)    Pneumonia    Status post amputation of toe of right foot (HCC) 09/24/2016   Status post transmetatarsal amputation of foot, 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/2018   STEMI involving left circumflex coronary artery (HCC) 07/12/2018   Coronary artery disease   Subacute osteomyelitis, right ankle and foot (HCC) 01/29/2016   Testicular mass 04/18/2016    Family History  Problem Relation Age of Onset   Diabetes Mother    Stroke Mother    Heart failure Father    Heart attack Father    Diabetes Sister        multiple siblings   Diabetes Brother        muliple siblings   Heart disease Brother    Colon cancer Neg Hx    Esophageal cancer Neg Hx    Rectal cancer Neg Hx    Stomach cancer Neg Hx     Past Surgical History:  Procedure Laterality Date   AMPUTATION Right 01/31/2016   Procedure: Right 2nd Toe Amputation;  Surgeon: [name], MD;  Location: [facility] OR;  Service: Orthopedics;  Laterality: Right;   AMPUTATION Right 07/08/2018   Procedure: RIGHT TRANSMETATARSAL AMPUTATION;  Surgeon: [name], MD;  Location: [facility] OR;  Service: Orthopedics;  Laterality: Right;   AMPUTATION Right 01/05/2020   Procedure: RIGHT BELOW KNEE AMPUTATION;  Surgeon: [name], MD;  Location: [facility] OR;  Service: Orthopedics;   Laterality: Right;   AMPUTATION Right 02/23/2020   Procedure: RIGHT BELOW KNEE AMPUTATION REVISION;  Surgeon: [name], MD;  Location: [facility] OR;  Service: Orthopedics;  Laterality: Right;   AMPUTATION Left 10/11/2022   Procedure: LEFT GREAT TOE AMPUTATION AND LEFT 2ND TOE AMPUTATION;  Surgeon: [name], MD;  Location: [facility] OR;  Service: Orthopedics;  Laterality: Left;   AMPUTATION Left 12/25/2022   Procedure: LEFT TRANSMETATARSAL AMPUTATION;  Surgeon: [name], MD;  Location: [facility] OR;  Service: Orthopedics;  Laterality: Left;   CATARACT EXTRACTION     right eye   COLONOSCOPY     CORONARY STENT INTERVENTION N/A 07/12/2018   Procedure: CORONARY STENT INTERVENTION;  Surgeon: Lennette Bihari, MD;  Location: [facility] LAB;  Service: Cardiovascular;  Laterality: N/A;   CORONARY/GRAFT ACUTE MI REVASCULARIZATION N/A 07/12/2018   Procedure: Coronary/Graft Acute MI Revascularization;  Surgeon: Lennette Bihari, MD;  Location: [facility];  Service: Cardiovascular;  Laterality: N/A;   I & D EXTREMITY  04/11/2012   Procedure: IRRIGATION AND DEBRIDEMENT EXTREMITY;  Surgeon: Toni Arthurs, MD;  Location: [facility] OR;  Service: Orthopedics;  Laterality: Right;   LEFT HEART CATH AND CORONARY ANGIOGRAPHY N/A 07/12/2018   Procedure: LEFT HEART CATH AND CORONARY ANGIOGRAPHY;  Surgeon: Lennette Bihari, MD;  Location: [facility];  Service: Cardiovascular;  Laterality: N/A;   Surgery left great toe     Tear ducts bilateral eyes     TRANSMETATARSAL AMPUTATION Right 07/08/2018   Social History   Occupational History   Occupation: disabled  Tobacco Use   Smoking status: Former    Current packs/day: 0.00    Average packs/day: 0.3 packs/day for 55.9 years (16.8 ttl pk-yrs)    Types: Cigars, Cigarettes    Start date: 07/02/1963    Quit date: 06/01/2019    Years since quitting: 3.8    Passive exposure: Past  Smokeless tobacco: Former  Building services engineer status: Former  Substance and Sexual  Activity   Alcohol use: Not Currently   Drug use: No   Sexual activity: Not on file

## 2023-03-24 ENCOUNTER — Ambulatory Visit: Payer: No Typology Code available for payment source | Admitting: Family Medicine

## 2023-03-24 NOTE — Progress Notes (Unsigned)
Cardiology Clinic Note   Patient Name: Adam Ray Date of Encounter: 03/25/2023  Primary Care Provider:  Latrelle Dodrill, MD Primary Cardiologist:  Adam Batty, MD  Patient Profile    67 year old male with  history of CAD, hypertension, hyperlipidemia, PAD, right BKA, chronic left lower extremity edema, type 2 diabetes, and tobacco use.   Past Medical History    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   Status post transmetatarsal amputation of foot, 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/2018   STEMI involving left circumflex coronary artery (HCC) 07/12/2018   Coronary artery disease   Subacute osteomyelitis, right ankle and foot (HCC) 01/29/2016   Testicular mass 04/18/2016   Past Surgical History:  Procedure Laterality Date   AMPUTATION Right 01/31/2016   Procedure: Right 2nd Toe Amputation;  Surgeon: Adam Mustard, MD;  Location: [facility] OR;  Service: Orthopedics;  Laterality: Right;   AMPUTATION Right 07/08/2018   Procedure: RIGHT TRANSMETATARSAL AMPUTATION;  Surgeon: Adam Mustard, MD;  Location: [facility] OR;  Service: Orthopedics;  Laterality: Right;   AMPUTATION Right 01/05/2020   Procedure: RIGHT BELOW KNEE AMPUTATION;  Surgeon: Adam Mustard, MD;  Location: [facility] OR;  Service: Orthopedics;  Laterality: Right;   AMPUTATION Right 02/23/2020   Procedure: RIGHT BELOW KNEE AMPUTATION REVISION;  Surgeon: Adam Mustard, MD;  Location: [facility] OR;  Service: Orthopedics;  Laterality:  Right;   AMPUTATION Left 10/11/2022   Procedure: LEFT GREAT TOE AMPUTATION AND LEFT 2ND TOE AMPUTATION;  Surgeon: Adam Mustard, MD;  Location: [facility] OR;  Service: Orthopedics;  Laterality: Left;   AMPUTATION Left 12/25/2022   Procedure: LEFT TRANSMETATARSAL AMPUTATION;  Surgeon: Adam Mustard, MD;  Location: [facility] OR;  Service: Orthopedics;  Laterality: Left;   CATARACT EXTRACTION     right eye   COLONOSCOPY     CORONARY STENT INTERVENTION N/A 07/12/2018   Procedure: CORONARY STENT INTERVENTION;  Surgeon: Adam Bihari, MD;  Location: [facility] INVASIVE [facility] LAB;  Service: Cardiovascular;  Laterality: N/A;   CORONARY/GRAFT ACUTE MI REVASCULARIZATION N/A 07/12/2018   Procedure: Coronary/Graft Acute MI Revascularization;  Surgeon: Adam Bihari, MD;  Location: [facility]VAS[facility] CV LAB;  Service: Cardiovascular;  Laterality: N/A;   I & D EXTREMITY  04/11/2012   Procedure: IRRIGATION AND DEBRIDEMENT EXTREMITY;  Surgeon: Adam Arthurs, MD;  Location: [facility] OR;  Service: Orthopedics;  Laterality: Right;   LEFT HEART CATH AND CORONARY ANGIOGRAPHY N/A 07/12/2018   Procedure: LEFT HEART CATH AND CORONARY ANGIOGRAPHY;  Surgeon: Adam Bihari, MD;  Location: [facility];  Service: Cardiovascular;  Laterality: N/A;   Surgery left great toe     Tear ducts bilateral eyes     TRANSMETATARSAL AMPUTATION Right 07/08/2018    Allergies  Allergies  Allergen Reactions   Codeine  Other (See Comments)    Heart attack.   Latex Hives and Itching   Morphine Other (See Comments)    Patient preference   Propofol Other (See Comments)    Patient preference    History of Present Illness    [name] returns today for ongoing assessment and management of PAD, hypertension, and has been told that he is a heart murmur.  He is afraid that he "has a hole in my heart" because of the heart murmur.  He has a history of bicuspid aortic valve, hypertension, and COPD.  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  He is due to follow-up with vein and vascular for ABIs and vascular ultrasound on March 31, 2023.  He denies any chest pain, he is not very ambulatory does walk around in his home with a walker and is here today in a wheelchair.  He denies any significant dyspnea on exertion but he is not very active.  He denies palpitations, heart racing, dizziness or near syncope.   Home Medications    Current Outpatient Medications  Medication Sig Dispense Refill   acetaminophen (TYLENOL) 500 MG tablet Take 1,500 mg by mouth every 6 (six) hours as needed for moderate pain, mild pain or headache.     aspirin 81 MG chewable tablet Chew 81 mg by mouth daily.     Cholecalciferol (VITAMIN D3) 50 MCG (2000 UT) TABS Take 2,000 Units by mouth daily.     CINNAMON PO Take 1,200 mg by mouth 2 (two) times daily. [place]     Continuous Glucose Receiver (FREESTYLE LIBRE 3 READER) DEVI 1 each by Does not apply route as directed. Use with sensor to monitor blood sugar 1 each 0   Continuous Glucose Sensor (FREESTYLE LIBRE 3 SENSOR) MISC 1 each by Does not apply route every 14 (fourteen) days. Place 1 sensor on the skin every 14 days. Use to check glucose continuously 2 each 11   diclofenac Sodium (VOLTAREN ARTHRITIS PAIN) 1 % GEL Apply 4 g topically 4 (four) times daily. 50 g 3   doxycycline (VIBRA-TABS) 100 MG tablet Take 1 tablet (100 mg total) by mouth 2 (two) times daily. 30 tablet 0   famotidine (PEPCID) 20 MG tablet TAKE 1 TABLET (20 MG TOTAL) BY MOUTH 2 (TWO) TIMES DAILY AS NEEDED FOR HEARTBURN OR INDIGESTION. 180 tablet 1   finasteride (PROSCAR) 5 MG tablet Take 5 mg by mouth daily.     furosemide (LASIX) 20 MG tablet Take 1 tablet (20 mg total) by mouth 2 (two) times daily. 180 tablet 3   insulin aspart (NOVOLOG) 100 UNIT/ML injection Inject 12 Units into the skin daily before supper. Take with largest meal of day     insulin glargine (LANTUS SOLOSTAR) 100 UNIT/ML Solostar Pen  Inject 35 Units into the skin daily.     metFORMIN (GLUCOPHAGE) 500 MG tablet TAKE 1 TABLET BY MOUTH 2 TIMES DAILY WITH A MEAL. (Patient taking differently: Take 500 mg by mouth 2 (two) times daily with a meal.) 180 tablet 1   metoprolol tartrate (LOPRESSOR) 25 MG tablet Take 25 mg by mouth 2 (two) times daily.     Multiple Vitamin (MULTIVITAMIN WITH MINERALS) TABS tablet Take 1 tablet by mouth daily.  iron     rosuvastatin (CRESTOR) 40 MG tablet Take 1 tablet (40 mg total) by mouth daily at 6 PM. (Patient taking differently: Take 20 mg  by mouth daily at 6 PM.)     sildenafil (VIAGRA) 100 MG tablet Take 100 mg by mouth daily as needed for erectile dysfunction.     tamsulosin (FLOMAX) 0.4 MG CAPS capsule Take 1 capsule (0.4 mg total) by mouth daily. 30 capsule 3   tolterodine (DETROL LA) 4 MG 24 hr capsule Take 4 mg by mouth daily.     Vibegron (GEMTESA) 75 MG TABS Take 75 mg by mouth daily.     No current facility-administered medications for this visit.     Family History    Family History  Problem Relation Age of Onset   Diabetes Mother    Stroke Mother    Heart failure Father    Heart attack Father    Diabetes Sister        multiple siblings   Diabetes Brother        muliple siblings   Heart disease Brother    Colon cancer Neg Hx    Esophageal cancer Neg Hx    Rectal cancer Neg Hx    Stomach cancer Neg Hx    He indicated that his mother is deceased. He indicated that his father is deceased. He indicated that the status of his sister is unknown. He indicated that the status of his brother is unknown. He indicated that the status of his neg hx is unknown.  Social History    Social History   Socioeconomic History   Marital status: Married    Spouse name: Marylene Land   Number of children: 3   Years of education: Not on file   Highest education level: Not on file  Occupational History   Occupation: disabled  Tobacco Use   Smoking status: Former    Current packs/day: 0.00     Average packs/day: 0.3 packs/day for 55.9 years (16.8 ttl pk-yrs)    Types: Cigars, Cigarettes    Start date: 07/02/1963    Quit date: 06/01/2019    Years since quitting: 3.8    Passive exposure: Past   Smokeless tobacco: Former  Building services engineer status: Former  Substance and Sexual Activity   Alcohol use: Not Currently   Drug use: No   Sexual activity: Not on file  Other Topics Concern   Not on file  Social History Narrative   Not on file   Social Determinants of Health   Financial Resource Strain: Medium Risk (08/23/2022)   Overall Financial Resource Strain (CARDIA)    Difficulty of Paying Living Expenses: Somewhat hard  Food Insecurity: No Food Insecurity (02/01/2023)   Hunger Vital Sign    Worried About Running Out of Food in the Last Year: Never true    Ran Out of Food in the Last Year: Never true  Transportation Needs: Unmet Transportation Needs (02/01/2023)   PRAPARE - Transportation    Lack of Transportation (Medical): Yes    Lack of Transportation (Non-Medical): Yes  Physical Activity: Insufficiently Active (08/23/2022)   Exercise Vital Sign    Days of Exercise per Week: 2 days    Minutes of Exercise per Session: 60 min  Stress: No Stress Concern Present (08/23/2022)   [employer] of Occupational Health - Occupational Stress Questionnaire    Feeling of Stress : Not at all  Social Connections: Moderately Integrated (08/23/2022)   Social Connection and Isolation Panel [NHANES]    Frequency of Communication with Friends and Family: Once a week    Frequency of Social Gatherings with Friends and Family: Never  Attends Religious Services: More than 4 times per year    Active Member of Clubs or Organizations: Yes    Attends Banker Meetings: 1 to 4 times per year    Marital Status: Married  Catering manager Violence: Not At Risk (02/01/2023)   Humiliation, Afraid, Rape, and Kick questionnaire    Fear of Current or Ex-Partner: No    Emotionally Abused: No     Physically Abused: No    Sexually Abused: No     Review of Systems    General:  No chills, fever, night sweats or weight changes.  Cardiovascular:  No chest pain, dyspnea on exertion, edema, orthopnea, palpitations, paroxysmal nocturnal dyspnea. Dermatological: No rash, lesions/masses Respiratory: No cough, dyspnea Urologic: No hematuria, dysuria Abdominal:   No nausea, vomiting, diarrhea, bright red blood per rectum, melena, or hematemesis Neurologic:  No visual changes, wkns, changes in mental status. All other systems reviewed and are otherwise negative except as noted above.       Physical Exam    VS:  BP 122/62 (BP Location: Left Arm, Patient Position: Sitting, Cuff Size: Normal)   Pulse 73   Ht 5\' 10"  (1.778 m)   Wt 168 lb 3.2 oz (76.3 kg)   SpO2 97%   BMI 24.13 kg/m  , BMI Body mass index is 24.13 kg/m.     GEN: Well nourished, well developed, in no acute distress.  Sitting in a wheelchair HEENT: normal. Neck: Supple, no JVD, carotid bruits, or masses. Cardiac: RRR, 2/6 holosystolic murmur heard best at the left sternal border and apex, no, rubs, or gallops. No clubbing, cyanosis, edema.  Radials/ 2+ and equal bilaterally.  AKA on the right with prostatic leg, diminished pulses on the left lower extremity dorsalis pedis posterior tibial..  Respiratory:  Respirations regular and unlabored, mild crackles in the bases without wheezes. GI: Soft, nontender, nondistended, BS + x 4. MS: no deformity or atrophy. Skin: warm and dry, no rash. Neuro:  Strength and sensation are intact. Psych: Normal affect.      Lab Results  Component Value Date   WBC 3.3 (L) 03/13/2023   HGB 11.0 (L) 03/13/2023   HCT 34.2 (L) 03/13/2023   MCV 83 03/13/2023   PLT 134 (L) 03/13/2023   Lab Results  Component Value Date   CREATININE 0.92 12/27/2022   BUN 16 12/27/2022   NA 134 (L) 12/27/2022   K 3.6 12/27/2022   CL 103 12/27/2022   CO2 22 12/27/2022   Lab Results  Component  Value Date   ALT 20 12/16/2022   AST 25 12/16/2022   ALKPHOS 92 12/16/2022   BILITOT 0.3 12/16/2022   Lab Results  Component Value Date   CHOL 97 (L) 01/17/2022   HDL 40 01/17/2022   LDLCALC 14 01/17/2022   LDLDIRECT 78 09/11/2011   TRIG 299 (H) 01/17/2022   CHOLHDL 2.4 01/17/2022    Lab Results  Component Value Date   HGBA1C 10.3 (A) 02/06/2023     Review of Prior Studies    Echocardiogram 07/13/2018 Left ventricle: The cavity size was normal. Wall thickness was    normal. Systolic function was vigorous. The estimated ejection    fraction was in the range of 65% to 70%. Wall motion was normal;    there were no regional wall motion abnormalities. Doppler    parameters are consistent with abnormal left ventricular    relaxation (grade 1 diastolic dysfunction). The E&'e&' ratio is    >15, suggesting elevated  LV filling pressure.  - Aortic valve: Bicuspid aortic valve with thickened leaflets. No    significant stenosis. There was no regurgitation.  - Mitral valve: Mildly thickened leaflets . There was mild    regurgitation.  - Left atrium: The atrium was normal in size.  - Inferior vena cava: The vessel was dilated. The respirophasic    diameter changes were blunted (< 50%), consistent with elevated    central venous pressure.   LHC with PCI Prox Cx to Mid Cx lesion is 95% stenosed. Post intervention, there is a 0% residual stenosis. A stent was successfully placed.   Acute inferior ST segment elevation myocardial infarction secondary to subtotal occlusion of a codominant mid left circumflex coronary artery.   Normal LAD and RCA.   LVEDP 20 mmHg.   Successful PCI to the mid left circumflex coronary artery with ultimate insertion of a 2.25 x 24 mm Synergy DES stent postdilated to 2.4 mm with the stenosis being reduced to 0%.   RECOMMENDATION: DAPT therapy for a minimum of 12 months of aspirin/Brilinta.  Optimal blood pressure control.  Aggressive lipid intervention  with target LDL less than 70.    Assessment & Plan   1.  Bicuspid aortic valve: Repeating echocardiogram for ongoing evaluation along with history of mitral valve regurgitation.  Last echocardiogram was done 4 years ago.  I have given him reassurance that he does not in fact, have a "hole in his heart" but does have a heart murmur which may be indicative of mild regurgitation.  He verbalizes understanding with my explanation.  Will continue ongoing evaluation.  The patient has had a CT of his abdomen and pelvis in January 2024 without evidence of aortic aneurysm dissection or stenosis.  No aneurysms were found in distal arterial system.  2.  Hypertension: Blood pressure is very well-controlled today on current medication regimen.  No changes are made.  Labs are ordered by PCP.  3.  Coronary artery disease: Most recent cardiac catheterization 07/12/2018 with proximal to mid circumflex lesion which was 95% stenosis status post PCI with 0 residual stenosis.  The patient had a normal LAD and RCA.  Continue secondary prevention with blood pressure control, statin therapy, low-cholesterol diet.  4.  PAD: Followed by vein and vascular service.  The patient has follow-up ultrasound of lower extremities on March 31, 2023.  Defer to those providers.  5.  Hypercholesterolemia: Continue rosuvastatin 40 mg daily.  Goal of LDL less than 70.  He will need follow-up fasting lipids and LFTs.       Signed, Bettey Mare. Liborio Nixon, ANP, AACC   03/25/2023 4:56 PM      Office [phone] Fax [phone]  Notice: This dictation was prepared with Dragon dictation along with smaller phrase technology. Any transcriptional errors that result from this process are unintentional and may not be corrected upon review.

## 2023-03-25 ENCOUNTER — Encounter: Payer: Self-pay | Admitting: Adult Health

## 2023-03-25 ENCOUNTER — Ambulatory Visit: Payer: 59 | Attending: Adult Health | Admitting: Adult Health

## 2023-03-25 VITALS — BP 122/62 | HR 73 | Ht 70.0 in | Wt 168.2 lb

## 2023-03-25 DIAGNOSIS — I059 Rheumatic mitral valve disease, unspecified: Secondary | ICD-10-CM

## 2023-03-25 NOTE — Patient Instructions (Signed)
Medication Instructions:  NONE   Lab Work: NONE   Testing/Procedures: [address]  Your physician has requested that you have an echocardiogram. Echocardiography is a painless test that uses sound waves to create images of your heart. It provides your doctor with information about the size and shape of your heart and how well your heart's chambers and valves are working. This procedure takes approximately one hour. There are no restrictions for this procedure. Please do NOT wear cologne, perfume, aftershave, or lotions (deodorant is allowed). Please arrive 15 minutes prior to your appointment time.    Follow-Up: At [facility], you and your health needs are our priority.  As part of our continuing mission to provide you with exceptional heart care, we have created designated Provider Care Teams.  These Care Teams include your primary Cardiologist (physician) and Advanced Practice Providers (APPs -  Physician Assistants and Nurse Practitioners) who all work together to provide you with the care you need, when you need it.  We recommend signing up for the patient portal called "MyChart".  Sign up information is provided on this After Visit Summary.  MyChart is used to connect with patients for Virtual Visits (Telemedicine).  Patients are able to view lab/test results, encounter notes, upcoming appointments, etc.  Non-urgent messages can be sent to your provider as well.   To learn more about what you can do with MyChart, go to [email or web address].    Your next appointment:   1 month(s)  Provider:   Joni Reining, DNP, ANP

## 2023-03-26 ENCOUNTER — Other Ambulatory Visit: Payer: Self-pay | Admitting: Orthopedic Surgery

## 2023-03-28 ENCOUNTER — Other Ambulatory Visit: Payer: Self-pay

## 2023-03-31 ENCOUNTER — Other Ambulatory Visit: Payer: Self-pay | Admitting: Vascular Surgery

## 2023-03-31 ENCOUNTER — Ambulatory Visit (INDEPENDENT_AMBULATORY_CARE_PROVIDER_SITE_OTHER): Payer: 59 | Admitting: Physician Assistant

## 2023-03-31 ENCOUNTER — Encounter: Payer: Self-pay | Admitting: Physician Assistant

## 2023-03-31 ENCOUNTER — Ambulatory Visit (HOSPITAL_COMMUNITY)
Admission: RE | Admit: 2023-03-31 | Discharge: 2023-03-31 | Disposition: A | Discharge status: Home / Self Care | Payer: 59 | Source: Ambulatory Visit | Attending: Surgery | Admitting: Surgery

## 2023-03-31 VITALS — BP 112/69 | HR 69 | Temp 97.8°F | Resp 18 | Ht 70.0 in | Wt 164.8 lb

## 2023-03-31 DIAGNOSIS — I890 Lymphedema, not elsewhere classified: Secondary | ICD-10-CM

## 2023-03-31 DIAGNOSIS — M7989 Other specified soft tissue disorders: Secondary | ICD-10-CM | POA: Insufficient documentation

## 2023-03-31 NOTE — Progress Notes (Signed)
Office Note     CC:  follow up Requesting Provider:  Latrelle Dodrill, MD  HPI: Adam Ray is a 67 y.o. (12/27/1955) male who presents for evaluation of left lower extremity edema.  He was seen earlier this year by Dr. Randie Heinz for the same complaint.  He was found to have no significant venous reflux disease.  He has since had a transmetatarsal amputation by Dr. Lajoyce Corners in June.  Amputation site has been slow to heal.  He underwent debridement of TMA site by Dr. Lajoyce Corners a couple weeks ago.  He has not been wearing compression or focusing on leg elevation.  He denies history of DVT.  Surgical history also significant for right below the knee amputation by Dr. Lajoyce Corners.  He is a former smoker.  He is on a daily aspirin and statin.  Past medical history also significant for insulin-dependent diabetes mellitus.   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   Status post transmetatarsal amputation of foot, 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/2018   STEMI involving left circumflex coronary artery (HCC) 07/12/2018   Coronary artery disease   Subacute osteomyelitis, right ankle and foot (HCC) 01/29/2016   Testicular mass 04/18/2016    Past Surgical History:  Procedure Laterality Date   AMPUTATION Right 01/31/2016   Procedure: Right 2nd Toe Amputation;  Surgeon: [name], MD;  Location: [facility] OR;  Service: Orthopedics;  Laterality: Right;   AMPUTATION Right 07/08/2018   Procedure: RIGHT  TRANSMETATARSAL AMPUTATION;  Surgeon: [name], MD;  Location: [facility] OR;  Service: Orthopedics;  Laterality: Right;   AMPUTATION Right 01/05/2020   Procedure: RIGHT BELOW KNEE AMPUTATION;  Surgeon: [name], MD;  Location: [facility] OR;  Service: Orthopedics;  Laterality: Right;   AMPUTATION Right 02/23/2020   Procedure: RIGHT BELOW KNEE AMPUTATION REVISION;  Surgeon: [name], MD;  Location: [facility] OR;  Service: Orthopedics;  Laterality: Right;   AMPUTATION Left 10/11/2022   Procedure: LEFT GREAT TOE AMPUTATION AND LEFT 2ND TOE AMPUTATION;  Surgeon: [name], MD;  Location: [facility] OR;  Service: Orthopedics;  Laterality: Left;   AMPUTATION Left 12/25/2022   Procedure: LEFT TRANSMETATARSAL AMPUTATION;  Surgeon: [name], MD;  Location: [facility] OR;  Service: Orthopedics;  Laterality: Left;   CATARACT EXTRACTION     right eye   COLONOSCOPY     CORONARY STENT INTERVENTION N/A 07/12/2018   Procedure: CORONARY STENT INTERVENTION;  Surgeon: Lennette Bihari, MD;  Location: [facility] IN[facility];  Service: Cardiovascular;  Laterality: N/A;   CORONARY/GRAFT ACUTE MI REVASCULARIZATION N/A 07/12/2018   Procedure: Coronary/Graft Acute MI Revascularization;  Surgeon: Lennette Bihari, MD;  Location: [facility];  Service: Cardiovascular;  Laterality: N/A;   I & D EXTREMITY  04/11/2012   Procedure: IRRIGATION  AND DEBRIDEMENT EXTREMITY;  Surgeon: Toni Arthurs, MD;  Location: [facility] OR;  Service: Orthopedics;  Laterality: Right;   LEFT HEART CATH AND CORONARY ANGIOGRAPHY N/A 07/12/2018   Procedure: LEFT HEART CATH AND CORONARY ANGIOGRAPHY;  Surgeon: Lennette Bihari, MD;  Location: [facility];  Service: Cardiovascular;  Laterality: N/A;   Surgery left great toe     Tear ducts bilateral eyes     TRANSMETATARSAL AMPUTATION Right 07/08/2018    Social History   Socioeconomic History   Marital status: Married    Spouse name: Adam Ray   Number of children: 3   Years of education: Not on file   Highest  education level: Not on file  Occupational History   Occupation: disabled  Tobacco Use   Smoking status: Former    Current packs/day: 0.00    Average packs/day: 0.3 packs/day for 55.9 years (16.8 ttl pk-yrs)    Types: Cigars, Cigarettes    Start date: 07/02/1963    Quit date: 06/01/2019    Years since quitting: 3.8    Passive exposure: Past   Smokeless tobacco: Former  Building services engineer status: Former  Substance and Sexual Activity   Alcohol use: Not Currently   Drug use: No   Sexual activity: Not on file  Other Topics Concern   Not on file  Social History Narrative   Not on file   Social Determinants of Health   Financial Resource Strain: Medium Risk (08/23/2022)   Overall Financial Resource Strain (CARDIA)    Difficulty of Paying Living Expenses: Somewhat hard  Food Insecurity: No Food Insecurity (02/01/2023)   Hunger Vital Sign    Worried About Running Out of Food in the Last Year: Never true    Ran Out of Food in the Last Year: Never true  Transportation Needs: Unmet Transportation Needs (02/01/2023)   PRAPARE - Transportation    Lack of Transportation (Medical): Yes    Lack of Transportation (Non-Medical): Yes  Physical Activity: Insufficiently Active (08/23/2022)   Exercise Vital Sign    Days of Exercise per Week: 2 days    Minutes of Exercise per Session: 60 min  Stress: No Stress Concern Present (08/23/2022)   [employer] of Occupational Health - Occupational Stress Questionnaire    Feeling of Stress : Not at all  Social Connections: Moderately Integrated (08/23/2022)   Social Connection and Isolation Panel [NHANES]    Frequency of Communication with Friends and Family: Once a week    Frequency of Social Gatherings with Friends and Family: Never    Attends Religious Services: More than 4 times per year    Active Member of [employer] or Organizations: Yes    Attends Banker Meetings: 1 to 4 times per year    Marital Status: Married  Catering manager  Violence: Not At Risk (02/01/2023)   Humiliation, Afraid, Rape, and Kick questionnaire    Fear of Current or Ex-Partner: No    Emotionally Abused: No    Physically Abused: No    Sexually Abused: No    Family History  Problem Relation Age of Onset   Diabetes Mother    Stroke Mother    Heart failure Father    Heart attack Father    Diabetes Sister        multiple siblings   Diabetes Brother        muliple siblings   Heart disease Brother    Colon cancer Neg Hx    Esophageal cancer Neg Hx  Rectal cancer Neg Hx    Stomach cancer Neg Hx     Current Outpatient Medications  Medication Sig Dispense Refill   acetaminophen (TYLENOL) 500 MG tablet Take 1,500 mg by mouth every 6 (six) hours as needed for moderate pain, mild pain or headache.     aspirin 81 MG chewable tablet Chew 81 mg by mouth daily.     Cholecalciferol (VITAMIN D3) 50 MCG (2000 UT) TABS Take 2,000 Units by mouth daily.     CINNAMON PO Take 1,200 mg by mouth 2 (two) times daily. [place]     Continuous Glucose Receiver (FREESTYLE LIBRE 3 READER) DEVI 1 each by Does not apply route as directed. Use with sensor to monitor blood sugar 1 each 0   Continuous Glucose Sensor (FREESTYLE LIBRE 3 SENSOR) MISC 1 each by Does not apply route every 14 (fourteen) days. Place 1 sensor on the skin every 14 days. Use to check glucose continuously 2 each 11   diclofenac Sodium (VOLTAREN ARTHRITIS PAIN) 1 % GEL Apply 4 g topically 4 (four) times daily. 50 g 3   doxycycline (VIBRA-TABS) 100 MG tablet Take 1 tablet (100 mg total) by mouth 2 (two) times daily. 30 tablet 0   famotidine (PEPCID) 20 MG tablet TAKE 1 TABLET (20 MG TOTAL) BY MOUTH 2 (TWO) TIMES DAILY AS NEEDED FOR HEARTBURN OR INDIGESTION. 180 tablet 1   finasteride (PROSCAR) 5 MG tablet Take 5 mg by mouth daily.     furosemide (LASIX) 20 MG tablet Take 1 tablet (20 mg total) by mouth 2 (two) times daily. 180 tablet 3   insulin aspart (NOVOLOG) 100 UNIT/ML injection Inject 12 Units  into the skin daily before supper. Take with largest meal of day     insulin glargine (LANTUS SOLOSTAR) 100 UNIT/ML Solostar Pen Inject 35 Units into the skin daily.     metFORMIN (GLUCOPHAGE) 500 MG tablet TAKE 1 TABLET BY MOUTH 2 TIMES DAILY WITH A MEAL. (Patient taking differently: Take 500 mg by mouth 2 (two) times daily with a meal.) 180 tablet 1   metoprolol tartrate (LOPRESSOR) 25 MG tablet Take 25 mg by mouth 2 (two) times daily.     Multiple Vitamin (MULTIVITAMIN WITH MINERALS) TABS tablet Take 1 tablet by mouth daily.  iron     rosuvastatin (CRESTOR) 40 MG tablet Take 1 tablet (40 mg total) by mouth daily at 6 PM. (Patient taking differently: Take 20 mg by mouth daily at 6 PM.)     sildenafil (VIAGRA) 100 MG tablet Take 100 mg by mouth daily as needed for erectile dysfunction.     tamsulosin (FLOMAX) 0.4 MG CAPS capsule Take 1 capsule (0.4 mg total) by mouth daily. 30 capsule 3   tolterodine (DETROL LA) 4 MG 24 hr capsule Take 4 mg by mouth daily.     Vibegron (GEMTESA) 75 MG TABS Take 75 mg by mouth daily.     No current facility-administered medications for this visit.    Allergies  Allergen Reactions   Codeine Other (See Comments)    Heart attack.   Latex Hives and Itching   Morphine Other (See Comments)    Patient preference   Propofol Other (See Comments)    Patient preference     REVIEW OF SYSTEMS:  [X]  denotes positive finding, [ ]  denotes negative finding Cardiac  Comments:  Chest pain or chest pressure:    Shortness of breath upon exertion:    Short of breath when lying flat:  Irregular heart rhythm:        Vascular    Pain in calf, thigh, or hip brought on by ambulation:    Pain in feet at night that wakes you up from your sleep:     Blood clot in your veins:    Leg swelling:         Pulmonary    Oxygen at home:    Productive cough:     Wheezing:         Neurologic    Sudden weakness in arms or legs:     Sudden numbness in arms or legs:     Sudden  onset of difficulty speaking or slurred speech:    Temporary loss of vision in one eye:     Problems with dizziness:         Gastrointestinal    Blood in stool:     Vomited blood:         Genitourinary    Burning when urinating:     Blood in urine:        Psychiatric    Major depression:         Hematologic    Bleeding problems:    Problems with blood clotting too easily:        Skin    Rashes or ulcers:        Constitutional    Fever or chills:      PHYSICAL EXAMINATION:  Vitals:   03/31/23 1515  BP: 112/69  Pulse: 69  Resp: 18  Temp: 97.8 F (36.6 C)  TempSrc: Temporal  SpO2: 96%  Weight: 164 lb 12.8 oz (74.8 kg)  Height: 5\' 10"  (1.778 m)    General:  WDWN in NAD; vital signs documented above Gait: Not observed HENT: WNL, normocephalic Pulmonary: normal non-labored breathing , without Rales, rhonchi,  wheezing Cardiac: regular HR Abdomen: soft, NT, no masses Skin: without rashes Vascular Exam/Pulses: Palpable left ATA and PTA Extremities: Pitting edema below the knee; small shallow ulcer TMA with granulation tissue present in the wound bed Musculoskeletal: no muscle wasting or atrophy  Neurologic: A&O X 3 Psychiatric:  The pt has Normal affect.   Non-Invasive Vascular Imaging:   Left lower extremity venous study negative for DVT    ASSESSMENT/PLAN:: 67 y.o. male here for follow up for evaluation of left leg swelling  Adam Ray is a 67 year old male who returns to clinic for evaluation of left lower extremity edema.  He was evaluated in office for the same by Dr. Randie Heinz in February of this year.  At the time he had a left lower extremity venous reflux study which was negative for deep or superficial venous reflux.  Left leg venous duplex today was negative for DVT.  Based on his pattern of swelling, he appears to have lymphedema.  Since last office visit he has had a transmetatarsal amputation by Dr. Lajoyce Corners.  He continues to follow regularly with Dr.  Lajoyce Corners for wound care including debridement of an ulcer 2 weeks ago.  I stressed the importance of managing his edema to help with wound healing.  He does not appear to have any arterial insufficiency given a palpable ATA and PTA on exam.  He needs to wear tight knee-high compression socks at least 15 to 20 mmHg on a daily basis.  He should periodically elevate his leg above the level of his heart 10 to 20 minutes at a time at least 3 times per day.  When not  resting and elevating he should try to avoid prolonged sitting and standing and be as active as possible.  He was wrapped to the knee in Ace wrap's after our office visit today.  He has a follow-up visit with Dr. Lajoyce Corners in a couple weeks.  For now he will follow-up on an as-needed basis.   Emilie Rutter, PA-C [facility] [phone]  Clinic MD:   Myra Gianotti

## 2023-04-01 ENCOUNTER — Inpatient Hospital Stay: Payer: 59 | Attending: Hematology

## 2023-04-01 DIAGNOSIS — E538 Deficiency of other specified B group vitamins: Secondary | ICD-10-CM | POA: Insufficient documentation

## 2023-04-01 DIAGNOSIS — D519 Vitamin B12 deficiency anemia, unspecified: Secondary | ICD-10-CM

## 2023-04-01 MED ORDER — CYANOCOBALAMIN 1000 MCG/ML IJ SOLN
1000.0000 ug | Freq: Once | INTRAMUSCULAR | Status: AC
  Administered 2023-04-01: 1000 ug via INTRAMUSCULAR

## 2023-04-01 NOTE — Patient Instructions (Signed)
Vitamin B12 Deficiency Vitamin B12 deficiency means that your body does not have enough vitamin B12. The body needs this important vitamin: To make red blood cells. To make genes (DNA). To help the nerves work. If you do not have enough vitamin B12 in your body, you can have health problems, such as not having enough red blood cells in the blood (anemia). What are the causes? Not eating enough foods that contain vitamin B12. Not being able to take in (absorb) vitamin B12 from the food that you eat. Certain diseases. A condition in which the body does not make enough of a certain protein. This results in your body not taking in enough vitamin B12. Having a surgery in which part of the stomach or small intestine is taken out. Taking medicines that make it hard for the body to take in vitamin B12. These include: Heartburn medicines. Some medicines that are used to treat diabetes. What increases the risk? Being an older adult. Eating a vegetarian or vegan diet that does not include any foods that come from animals. Not eating enough foods that contain vitamin B12 while you are pregnant. Taking certain medicines. Having alcoholism. What are the signs or symptoms? In some cases, there are no symptoms. If the condition leads to too few blood cells or nerve damage, symptoms can occur, such as: Feeling weak or tired. Not being hungry. Losing feeling (numbness) or tingling in your hands and feet. Redness and burning of the tongue. Feeling sad (depressed). Confusion or memory problems. Trouble walking. If anemia is very bad, symptoms can include: Being short of breath. Being dizzy. Having a very fast heartbeat. How is this treated? Changing the way you eat and drink, such as: Eating more foods that contain vitamin B12. Drinking little or no alcohol. Getting vitamin B12 shots. Taking vitamin B12 supplements by mouth (orally). Your doctor will tell you the dose that is best for you. Follow  these instructions at home: Eating and drinking  Eat foods that come from animals and have a lot of vitamin B12 in them. These include: Meats and poultry. This includes beef, pork, chicken, [place], and organ meats, such as liver. Seafood, such as clams, rainbow trout, salmon, tuna, and haddock. Eggs. Dairy foods such as milk, yogurt, and cheese. Eat breakfast cereals that have vitamin B12 added to them (are fortified). Check the label. The items listed above may not be a complete list of foods and beverages you can eat and drink. Contact a dietitian for more information. Alcohol use Do not drink alcohol if: Your doctor tells you not to drink. You are pregnant, may be pregnant, or are planning to become pregnant. If you drink alcohol: Limit how much you have to: 0-1 drink a day for women. 0-2 drinks a day for men. Know how much alcohol is in your drink. In the [place] one drink equals one 12 oz bottle of beer (355 mL), one 5 oz glass of wine (148 mL), or one 1 oz glass of hard liquor (44 mL). General instructions Get any vitamin B12 shots if told by your doctor. Take supplements only as told by your doctor. Follow the directions. Keep all follow-up visits. Contact a doctor if: Your symptoms come back. Your symptoms get worse or do not get better with treatment. Get help right away if: You have trouble breathing. You have a very fast heartbeat. You have chest pain. You get dizzy. You faint. These symptoms may be an emergency. Get help right away. Call 911.  Do not wait to see if the symptoms will go away. Do not drive yourself to the hospital. Summary Vitamin B12 deficiency means that your body is not getting enough of the vitamin. In some cases, there are no symptoms of this condition. Treatment may include making a change in the way you eat and drink, getting shots, or taking supplements. Eat foods that have vitamin B12 in them. This information is not intended to replace advice  given to you by your health care provider. Make sure you discuss any questions you have with your health care provider. Document Revised: 02/09/2021 Document Reviewed: 02/09/2021 Elsevier Patient Education  2024 [employer].

## 2023-04-06 ENCOUNTER — Other Ambulatory Visit: Payer: Self-pay | Admitting: Cardiovascular Disease

## 2023-04-06 DIAGNOSIS — M7989 Other specified soft tissue disorders: Secondary | ICD-10-CM

## 2023-04-06 DIAGNOSIS — I10 Essential (primary) hypertension: Secondary | ICD-10-CM

## 2023-04-06 DIAGNOSIS — I2510 Atherosclerotic heart disease of native coronary artery without angina pectoris: Secondary | ICD-10-CM

## 2023-04-06 DIAGNOSIS — I739 Peripheral vascular disease, unspecified: Secondary | ICD-10-CM

## 2023-04-07 ENCOUNTER — Ambulatory Visit: Payer: No Typology Code available for payment source | Admitting: Family Medicine

## 2023-04-10 ENCOUNTER — Ambulatory Visit (HOSPITAL_COMMUNITY): Payer: No Typology Code available for payment source | Attending: Adult Health

## 2023-04-13 NOTE — Progress Notes (Deleted)
Cardiology Clinic Note   Patient Name: Adam Ray Date of Encounter: 04/13/2023  Primary Care Provider:  Latrelle Dodrill, MD Primary Cardiologist:  Nanetta Batty, MD  Patient Profile    67 year old male with  history of CAD, hypertension, hyperlipidemia, PAD, right BKA, chronic left lower extremity edema, type 2 diabetes, and tobacco use.  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  Last seen in the office by me on 03/25/2023.  I repeated his echocardiogram due to back cuspid aortic valve by history and mitral valve regurgitation it was noted that he did have a CT scan of his abdomen and pelvis in January 2024 with no evidence of aortic aneurysm or dissection.  Blood pressure was well-controlled, and he was to follow-up with vein and vascular for PAD.  His echocardiogram is scheduled for 04/24/2023.  Past Medical History    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   Status post transmetatarsal amputation of foot, 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/2018   STEMI involving left circumflex coronary artery (HCC) 07/12/2018   Coronary artery disease   Subacute osteomyelitis, right ankle and foot (HCC) 01/29/2016   Testicular mass 04/18/2016   Past Surgical History:  Procedure Laterality Date   AMPUTATION Right 01/31/2016   Procedure: Right 2nd Toe Amputation;  Surgeon: [name], MD;  [facility]: [facility];  Service: Orthopedics;  Laterality: Right;    AMPUTATION Right 07/08/2018   Procedure: RIGHT TRANSMETATARSAL AMPUTATION;  Surgeon: [name], MD;  Location: [facility] OR;  Service: Orthopedics;  Laterality: Right;   AMPUTATION Right 01/05/2020   Procedure: RIGHT BELOW KNEE AMPUTATION;  Surgeon: [name], MD;  Location: [facility] OR;  Service: Orthopedics;  Laterality: Right;   AMPUTATION Right 02/23/2020   Procedure: RIGHT BELOW KNEE AMPUTATION REVISION;  Surgeon: [name], MD;  Location: [facility] OR;  Service: Orthopedics;  Laterality: Right;   AMPUTATION Left 10/11/2022   Procedure: LEFT GREAT TOE AMPUTATION AND LEFT 2ND TOE AMPUTATION;  Surgeon: [name], MD;  Location: [facility] OR;  Service: Orthopedics;  Laterality: Left;   AMPUTATION Left 12/25/2022   Procedure: LEFT TRANSMETATARSAL AMPUTATION;  Surgeon: [name], MD;  Location: [facility] OR;  Service: Orthopedics;  Laterality: Left;   CATARACT EXTRACTION     right eye   COLONOSCOPY     CORONARY STENT INTERVENTION N/A 07/12/2018   Procedure: CORONARY STENT INTERVENTION;  Surgeon: Lennette Bihari, MD;  [facility];  Service: Cardiovascular;  Laterality: N/A;   CORONARY/GRAFT ACUTE MI REVASCULARIZATION N/A 07/12/2018   Procedure: Coronary/Graft Acute MI Revascularization;  Surgeon: Lennette Bihari, MD;  [facility];  Service: Cardiovascular;  Laterality: N/A;   I & D EXTREMITY  04/11/2012   Procedure: IRRIGATION AND  DEBRIDEMENT EXTREMITY;  Surgeon: Toni Arthurs, MD;  Location: [facility] OR;  Service: Orthopedics;  Laterality: Right;   LEFT HEART CATH AND CORONARY ANGIOGRAPHY N/A 07/12/2018   Procedure: LEFT HEART CATH AND CORONARY ANGIOGRAPHY;  Surgeon: Lennette Bihari, MD;  [facility];  Service: Cardiovascular;  Laterality: N/A;   Surgery left great toe     Tear ducts bilateral eyes     TRANSMETATARSAL AMPUTATION Right 07/08/2018    Allergies  Allergies  Allergen Reactions   Codeine Other (See Comments)    Heart attack.   Latex Hives and  Itching   Morphine Other (See Comments)    Patient preference   Propofol Other (See Comments)    Patient preference    History of Present Illness    ***  Home Medications    Current Outpatient Medications  Medication Sig Dispense Refill   acetaminophen (TYLENOL) 500 MG tablet Take 1,500 mg by mouth every 6 (six) hours as needed for moderate pain, mild pain or headache.     aspirin 81 MG chewable tablet Chew 81 mg by mouth daily.     Cholecalciferol (VITAMIN D3) 50 MCG (2000 UT) TABS Take 2,000 Units by mouth daily.     CINNAMON PO Take 1,200 mg by mouth 2 (two) times daily. [place]     Continuous Glucose Receiver (FREESTYLE LIBRE 3 READER) DEVI 1 each by Does not apply route as directed. Use with sensor to monitor blood sugar 1 each 0   Continuous Glucose Sensor (FREESTYLE LIBRE 3 SENSOR) MISC 1 each by Does not apply route every 14 (fourteen) days. Place 1 sensor on the skin every 14 days. Use to check glucose continuously 2 each 11   diclofenac Sodium (VOLTAREN ARTHRITIS PAIN) 1 % GEL Apply 4 g topically 4 (four) times daily. 50 g 3   doxycycline (VIBRA-TABS) 100 MG tablet Take 1 tablet (100 mg total) by mouth 2 (two) times daily. 30 tablet 0   famotidine (PEPCID) 20 MG tablet TAKE 1 TABLET (20 MG TOTAL) BY MOUTH 2 (TWO) TIMES DAILY AS NEEDED FOR HEARTBURN OR INDIGESTION. 180 tablet 1   finasteride (PROSCAR) 5 MG tablet Take 5 mg by mouth daily.     furosemide (LASIX) 20 MG tablet TAKE 1 TABLET BY MOUTH TWICE A DAY 180 tablet 3   insulin aspart (NOVOLOG) 100 UNIT/ML injection Inject 12 Units into the skin daily before supper. Take with largest meal of day     insulin glargine (LANTUS SOLOSTAR) 100 UNIT/ML Solostar Pen Inject 35 Units into the skin daily.     metFORMIN (GLUCOPHAGE) 500 MG tablet TAKE 1 TABLET BY MOUTH 2 TIMES DAILY WITH A MEAL. (Patient taking differently: Take 500 mg by mouth 2 (two) times daily with a meal.) 180 tablet 1   metoprolol tartrate (LOPRESSOR) 25 MG tablet  Take 25 mg by mouth 2 (two) times daily.     Multiple Vitamin (MULTIVITAMIN WITH MINERALS) TABS tablet Take 1 tablet by mouth daily.  iron     rosuvastatin (CRESTOR) 40 MG tablet Take 1 tablet (40 mg total) by mouth daily at 6 PM. (Patient taking differently: Take 20 mg by mouth daily at 6 PM.)     sildenafil (VIAGRA) 100 MG tablet Take 100 mg by mouth daily as needed for erectile dysfunction.     tamsulosin (FLOMAX) 0.4 MG CAPS capsule Take 1 capsule (0.4 mg total) by mouth daily. 30 capsule 3   tolterodine (DETROL LA) 4 MG 24 hr capsule Take  4 mg by mouth daily.     Vibegron (GEMTESA) 75 MG TABS Take 75 mg by mouth daily.     No current facility-administered medications for this visit.     Family History    Family History  Problem Relation Age of Onset   Diabetes Mother    Stroke Mother    Heart failure Father    Heart attack Father    Diabetes Sister        multiple siblings   Diabetes Brother        muliple siblings   Heart disease Brother    Colon cancer Neg Hx    Esophageal cancer Neg Hx    Rectal cancer Neg Hx    Stomach cancer Neg Hx    He indicated that his mother is deceased. He indicated that his father is deceased. He indicated that the status of his sister is unknown. He indicated that the status of his brother is unknown. He indicated that the status of his neg hx is unknown.  Social History    Social History   Socioeconomic History   Marital status: Married    Spouse name: Marylene Land   Number of children: 3   Years of education: Not on file   Highest education level: Not on file  Occupational History   Occupation: disabled  Tobacco Use   Smoking status: Former    Current packs/day: 0.00    Average packs/day: 0.3 packs/day for 55.9 years (16.8 ttl pk-yrs)    Types: Cigars, Cigarettes    Start date: 07/02/1963    Quit date: 06/01/2019    Years since quitting: 3.8    Passive exposure: Past   Smokeless tobacco: Former  Building services engineer status: Former   Substance and Sexual Activity   Alcohol use: Not Currently   Drug use: No   Sexual activity: Not on file  Other Topics Concern   Not on file  Social History Narrative   Not on file   Social Determinants of Health   Financial Resource Strain: Medium Risk (08/23/2022)   Overall Financial Resource Strain (CARDIA)    Difficulty of Paying Living Expenses: Somewhat hard  Food Insecurity: No Food Insecurity (02/01/2023)   Hunger Vital Sign    Worried About Running Out of Food in the Last Year: Never true    Ran Out of Food in the Last Year: Never true  Transportation Needs: Unmet Transportation Needs (02/01/2023)   PRAPARE - Transportation    Lack of Transportation (Medical): Yes    Lack of Transportation (Non-Medical): Yes  Physical Activity: Insufficiently Active (08/23/2022)   Exercise Vital Sign    Days of Exercise per Week: 2 days    Minutes of Exercise per Session: 60 min  Stress: No Stress Concern Present (08/23/2022)   [employer] of Occupational Health - Occupational Stress Questionnaire    Feeling of Stress : Not at all  Social Connections: Moderately Integrated (08/23/2022)   Social Connection and Isolation Panel [NHANES]    Frequency of Communication with Friends and Family: Once a week    Frequency of Social Gatherings with Friends and Family: Never    Attends Religious Services: More than 4 times per year    Active Member of [employer] or Organizations: Yes    Attends Banker Meetings: 1 to 4 times per year    Marital Status: Married  Catering manager Violence: Not At Risk (02/01/2023)   Humiliation, Afraid, Rape, and Kick questionnaire  Fear of Current or Ex-Partner: No    Emotionally Abused: No    Physically Abused: No    Sexually Abused: No     Review of Systems    General:  No chills, fever, night sweats or weight changes.  Cardiovascular:  No chest pain, dyspnea on exertion, edema, orthopnea, palpitations, paroxysmal nocturnal  dyspnea. Dermatological: No rash, lesions/masses Respiratory: No cough, dyspnea Urologic: No hematuria, dysuria Abdominal:   No nausea, vomiting, diarrhea, bright red blood per rectum, melena, or hematemesis Neurologic:  No visual changes, wkns, changes in mental status. All other systems reviewed and are otherwise negative except as noted above.       Physical Exam    VS:  There were no vitals taken for this visit. , BMI There is no height or weight on file to calculate BMI.     GEN: Well nourished, well developed, in no acute distress. HEENT: normal. Neck: Supple, no JVD, carotid bruits, or masses. Cardiac: RRR, no murmurs, rubs, or gallops. No clubbing, cyanosis, edema.  Radials/DP/PT 2+ and equal bilaterally.  Respiratory:  Respirations regular and unlabored, clear to auscultation bilaterally. GI: Soft, nontender, nondistended, BS + x 4. MS: no deformity or atrophy. Skin: warm and dry, no rash. Neuro:  Strength and sensation are intact. Psych: Normal affect.      Lab Results  Component Value Date   WBC 3.3 (L) 03/13/2023   HGB 11.0 (L) 03/13/2023   HCT 34.2 (L) 03/13/2023   MCV 83 03/13/2023   PLT 134 (L) 03/13/2023   Lab Results  Component Value Date   CREATININE 0.92 12/27/2022   BUN 16 12/27/2022   NA 134 (L) 12/27/2022   K 3.6 12/27/2022   CL 103 12/27/2022   CO2 22 12/27/2022   Lab Results  Component Value Date   ALT 20 12/16/2022   AST 25 12/16/2022   ALKPHOS 92 12/16/2022   BILITOT 0.3 12/16/2022   Lab Results  Component Value Date   CHOL 97 (L) 01/17/2022   HDL 40 01/17/2022   LDLCALC 14 01/17/2022   LDLDIRECT 78 09/11/2011   TRIG 299 (H) 01/17/2022   CHOLHDL 2.4 01/17/2022    Lab Results  Component Value Date   HGBA1C 10.3 (A) 02/06/2023     Review of Prior Studies      Assessment & Plan   1.  ***     {Are you ordering a CV Procedure (e.g. stress test, cath, DCCV, TEE, etc)?   Press F2        :[id number]}   Signed, Bettey Mare.  Liborio Nixon, ANP, AACC   04/13/2023 2:47 PM      Office [phone] Fax [phone]  Notice: This dictation was prepared with Dragon dictation along with smaller phrase technology. Any transcriptional errors that result from this process are unintentional and may not be corrected upon review.

## 2023-04-14 ENCOUNTER — Ambulatory Visit: Payer: No Typology Code available for payment source | Admitting: Family Medicine

## 2023-04-14 ENCOUNTER — Telehealth: Payer: Self-pay

## 2023-04-14 NOTE — Telephone Encounter (Signed)
Called patient to reschedule appointment from 04/15/23 to 05/09/2023.

## 2023-04-15 ENCOUNTER — Ambulatory Visit (INDEPENDENT_AMBULATORY_CARE_PROVIDER_SITE_OTHER): Payer: 59 | Admitting: Orthopedic Surgery

## 2023-04-15 ENCOUNTER — Ambulatory Visit: Payer: No Typology Code available for payment source | Admitting: Adult Health

## 2023-04-15 DIAGNOSIS — I890 Lymphedema, not elsewhere classified: Secondary | ICD-10-CM

## 2023-04-15 DIAGNOSIS — T8781 Dehiscence of amputation stump: Secondary | ICD-10-CM

## 2023-04-15 DIAGNOSIS — Z89432 Acquired absence of left foot: Secondary | ICD-10-CM | POA: Diagnosis not present

## 2023-04-15 DIAGNOSIS — I872 Venous insufficiency (chronic) (peripheral): Secondary | ICD-10-CM | POA: Diagnosis not present

## 2023-04-16 ENCOUNTER — Encounter: Payer: Self-pay | Admitting: Orthopedic Surgery

## 2023-04-16 NOTE — Progress Notes (Signed)
Office Visit Note   Patient: Adam Ray           Date of Birth: [birth year]           MRN: [id number] Visit Date: 04/15/2023              Requested by: Latrelle Dodrill, MD [address] [place],  [place] [address] PCP: Latrelle Dodrill, MD  Chief Complaint  Patient presents with   Left Foot - Follow-up    12/23/22 left TMA      HPI: Patient is a 67 year old gentleman status post left transmetatarsal amputation with venous and lymphatic insufficiency and swelling of the left lower extremity.  Assessment & Plan: Visit Diagnoses:  1. History of transmetatarsal amputation of left foot (HCC)   2. Dehiscence of amputation stump of left lower extremity (HCC)   3. Lymphedema due to venous insufficiency     Plan: The residual ulcer is flat with healthy granulation tissue discussed the importance of elevation and compression.  Patient's wife will also need to provide care for him at home for activities of daily living.  She will need to stop work to care for him.  Follow-Up Instructions: Return in about 4 weeks (around 05/13/2023).   Ortho Exam  Patient is alert, oriented, no adenopathy, well-dressed, normal affect, normal respiratory effort. Examination the wound bed is flat with healthy granulation tissue and measures 1 x 2 cm there is no exposed bone or tendon.  Patient does have significant pitting lymphatic insufficiency edema of the left lower extremity.  Imaging: No results found. No images are attached to the encounter.  Labs: Lab Results  Component Value Date   HGBA1C 10.3 (A) 02/06/2023   HGBA1C 6.6 11/18/2022   HGBA1C 7.9 (A) 07/05/2022   ESRSEDRATE 54 (H) 01/01/2020   ESRSEDRATE 8 01/29/2016   ESRSEDRATE 9 04/11/2012   CRP 2.6 (H) 01/29/2016   CRP 0.5 02/28/2014   REPTSTATUS 01/18/2020 FINAL 01/17/2020   REPTSTATUS 01/22/2020 FINAL 01/17/2020   REPTSTATUS 01/22/2020 FINAL 01/17/2020   GRAMSTAIN  04/11/2012    NO WBC SEEN RARE SQUAMOUS  EPITHELIAL CELLS PRESENT MODERATE GRAM POSITIVE COCCI IN PAIRS RARE GRAM NEGATIVE RODS   GRAMSTAIN  04/11/2012    NO WBC SEEN RARE SQUAMOUS EPITHELIAL CELLS PRESENT MODERATE GRAM POSITIVE COCCI IN PAIRS RARE GRAM NEGATIVE RODS   CULT (A) 01/17/2020    <10,000 COLONIES/mL INSIGNIFICANT GROWTH Performed at [facility], [address], [place], [place] [address]    CULT  01/17/2020    NO GROWTH 5 DAYS Performed at [facility], [address], [place], [place] [address]    CULT  01/17/2020    NO GROWTH 5 DAYS Performed at [facility], [address], [place], [place] [address]    LABORGA NO GROWTH 2 DAYS 07/19/2014     Lab Results  Component Value Date   ALBUMIN 4.1 12/16/2022   ALBUMIN 4.5 01/17/2022   ALBUMIN 4.1 12/06/2020    Lab Results  Component Value Date   MG 1.5 (L) 12/27/2022   No results found for: "VD25OH"  No results found for: "PREALBUMIN"    Latest Ref Rng & Units 03/13/2023    5:13 PM 02/01/2023    9:21 AM 12/27/2022   11:42 PM  CBC EXTENDED  WBC 3.4 - 10.8 x10E3/uL 3.3  3.7    RBC 4.14 - 5.80 x10E6/uL 4.12  4.27    4.25    Hemoglobin 13.0 -  17.7 g/dL 16.1  09.6  8.8   HCT 04.5 - 51.0 % 34.2  35.0  26.0   Platelets 150 - 450 x10E3/uL 134  123    NEUT# 1.7 - 7.7 K/uL  1.7    Lymph# 0.7 - 4.0 K/uL  1.4       There is no height or weight on file to calculate BMI.  Orders:  No orders of the defined types were placed in this encounter.  No orders of the defined types were placed in this encounter.    Procedures: No procedures performed  Clinical Data: No additional findings.  ROS:  All other systems negative, except as noted in the HPI. Review of Systems  Objective: Vital Signs: There were no vitals taken for this visit.  Specialty Comments:  No specialty comments available.  PMFS History: Patient Active Problem List   Diagnosis Date Noted   Heart murmur, systolic 03/13/2023   Pancytopenia (HCC) 02/18/2023    Bone lesion 02/18/2023   B12 deficiency anemia 02/18/2023   Wound dehiscence, surgical, initial encounter 12/25/2022   Maggot infestation 12/25/2022   Abnormal bone radiograph 12/18/2022   Osteomyelitis of second toe of left foot (HCC) 10/11/2022   Osteomyelitis of great toe of left foot (HCC) 10/11/2022   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   Atrophy of tongue papillae 10/02/2022   Atherosclerosis of coronary artery without angina pectoris 10/02/2022   Age-related nuclear cataract of left eye 10/02/2022   Manson Passey hairy tongue 10/02/2022   Proliferative diabetic retinopathy of right eye associated with type 2 diabetes mellitus (HCC) 10/02/2022   Tongue discoloration 02/14/2022   Hematuria 08/16/2021   Cervical spondylosis without myelopathy 12/14/2020   Pain in joint of left shoulder 12/14/2020   Urinary incontinence 08/10/2020   Increased urinary frequency 05/09/2020   Nocturnal leg cramps 10/28/2019   Hyperlipidemia associated with type 2 diabetes mellitus (HCC) 05/26/2019   Limited mobility 05/12/2018   Anxiety state 06/02/2017   Leg swelling 01/29/2017   Idiopathic chronic venous hypertension of both lower extremities with inflammation 09/24/2016   Diabetic polyneuropathy associated with type 2 diabetes mellitus (HCC)    Type 2 diabetes mellitus with neurologic complication, with long-term current use of insulin (HCC) 12/08/2015   Left shoulder pain 08/10/2015   Pulmonary nodule 02/01/2015   Depression 12/09/2013   Warts, genital 08/20/2013   Diabetic retinopathy (HCC) 01/28/2013   Sleep apnea 09/06/2010   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   Hypertension associated with diabetes (HCC) 11/09/2008   COPD, mild (HCC) 10/06/2006   SICKLE-CELL TRAIT 04/26/2006   ERECTILE DYSFUNCTION 04/26/2006   Tobacco use 04/26/2006   Peripheral arterial disease (HCC) 04/26/2006   Allergic rhinitis 04/26/2006   Past Medical History:  Diagnosis Date    Allergy    Arthritis    Bronchitis    Cataract    CHF (congestive heart failure) (HCC)    Chronic kidney disease    Claudication (HCC)    right foot ray resection   Colon polyps    hyperplastic   COPD (chronic obstructive pulmonary disease) (HCC)    Coronary artery disease    Diabetes mellitus    type II   Genital warts    Gout    Hyperlipidemia    Hypertension    Myocardial infarction (HCC)    Osteomyelitis of third toe of right foot (HCC)    Pneumonia    Status post amputation of toe of right foot (HCC) 09/24/2016  Status post transmetatarsal amputation of foot, 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/2018   STEMI involving left circumflex coronary artery (HCC) 07/12/2018   Coronary artery disease   Subacute osteomyelitis, right ankle and foot (HCC) 01/29/2016   Testicular mass 04/18/2016    Family History  Problem Relation Age of Onset   Diabetes Mother    Stroke Mother    Heart failure Father    Heart attack Father    Diabetes Sister        multiple siblings   Diabetes Brother        muliple siblings   Heart disease Brother    Colon cancer Neg Hx    Esophageal cancer Neg Hx    Rectal cancer Neg Hx    Stomach cancer Neg Hx     Past Surgical History:  Procedure Laterality Date   AMPUTATION Right 01/31/2016   Procedure: Right 2nd Toe Amputation;  Surgeon: [name], MD;  [facility];  Service: Orthopedics;  Laterality: Right;   AMPUTATION Right 07/08/2018   Procedure: RIGHT TRANSMETATARSAL AMPUTATION;  Surgeon: [name], MD;  Location: [facility] OR;  Service: Orthopedics;  Laterality: Right;   AMPUTATION Right 01/05/2020   Procedure: RIGHT BELOW KNEE AMPUTATION;  Surgeon: [name], MD;  Location: [facility] OR;  Service: Orthopedics;  Laterality: Right;   AMPUTATION Right 02/23/2020   Procedure: RIGHT BELOW KNEE AMPUTATION REVISION;  Surgeon: [name], MD;  Location: [facility] OR;  Service: Orthopedics;  Laterality: Right;   AMPUTATION Left 10/11/2022   Procedure: LEFT GREAT TOE  AMPUTATION AND LEFT 2ND TOE AMPUTATION;  Surgeon: [name], MD;  Location: [facility] OR;  Service: Orthopedics;  Laterality: Left;   AMPUTATION Left 12/25/2022   Procedure: LEFT TRANSMETATARSAL AMPUTATION;  Surgeon: [name], MD;  Location: [facility] OR;  Service: Orthopedics;  Laterality: Left;   CATARACT EXTRACTION     right eye   COLONOSCOPY     CORONARY STENT INTERVENTION N/A 07/12/2018   Procedure: CORONARY STENT INTERVENTION;  Surgeon: Lennette Bihari, MD;  [facility];  Service: Cardiovascular;  Laterality: N/A;   CORONARY/GRAFT ACUTE MI REVASCULARIZATION N/A 07/12/2018   Procedure: Coronary/Graft Acute MI Revascularization;  Surgeon: Lennette Bihari, MD;  [facility];  Service: Cardiovascular;  Laterality: N/A;   I & D EXTREMITY  04/11/2012   Procedure: IRRIGATION AND DEBRIDEMENT EXTREMITY;  Surgeon: Toni Arthurs, MD;  Location: [facility];  Service: Orthopedics;  Laterality: Right;   LEFT HEART CATH AND CORONARY ANGIOGRAPHY N/A 07/12/2018   Procedure: LEFT HEART CATH AND CORONARY ANGIOGRAPHY;  Surgeon: Lennette Bihari, MD;  [facility];  Service: Cardiovascular;  Laterality: N/A;   Surgery left great toe     Tear ducts bilateral eyes     TRANSMETATARSAL AMPUTATION Right 07/08/2018   Social History   Occupational History   Occupation: disabled  Tobacco Use   Smoking status: Former    Current packs/day: 0.00    Average packs/day: 0.3 packs/day for 55.9 years (16.8 ttl pk-yrs)    Types: Cigars, Cigarettes    Start date: 07/02/1963    Quit date: 06/01/2019    Years since quitting: 3.8    Passive exposure: Past   Smokeless tobacco: Former  Building services engineer status: Former  Substance and Sexual Activity   Alcohol use: Not Currently   Drug use: No   Sexual activity: Not on file

## 2023-04-23 ENCOUNTER — Encounter: Payer: Self-pay | Admitting: Family Medicine

## 2023-04-28 ENCOUNTER — Encounter: Payer: Self-pay | Admitting: Family Medicine

## 2023-04-28 ENCOUNTER — Ambulatory Visit (INDEPENDENT_AMBULATORY_CARE_PROVIDER_SITE_OTHER): Payer: 59 | Admitting: Family Medicine

## 2023-04-28 VITALS — BP 114/64 | HR 69 | Ht 70.0 in | Wt 167.0 lb

## 2023-04-28 DIAGNOSIS — Z794 Long term (current) use of insulin: Secondary | ICD-10-CM | POA: Diagnosis not present

## 2023-04-28 DIAGNOSIS — E1141 Type 2 diabetes mellitus with diabetic mononeuropathy: Secondary | ICD-10-CM | POA: Diagnosis not present

## 2023-04-28 DIAGNOSIS — H9191 Unspecified hearing loss, right ear: Secondary | ICD-10-CM | POA: Diagnosis not present

## 2023-04-28 DIAGNOSIS — Z0000 Encounter for general adult medical examination without abnormal findings: Secondary | ICD-10-CM

## 2023-04-28 LAB — POCT GLYCOSYLATED HEMOGLOBIN (HGB A1C): HbA1c, POC (controlled diabetic range): 10 % — AB (ref 0.0–7.0)

## 2023-04-28 MED ORDER — LANTUS SOLOSTAR 100 UNIT/ML ~~LOC~~ SOPN
35.0000 [IU] | PEN_INJECTOR | Freq: Every day | SUBCUTANEOUS | Status: DC
Start: 2023-04-28 — End: 2023-05-05

## 2023-04-28 NOTE — Progress Notes (Unsigned)
  Date of Visit: 04/28/2023   SUBJECTIVE:   HPI:  Haaris presents today for routine follow up.  Diabetes - currently taking lantus 30u daily and novolog 15-20u twice daily with meals (has been self adjusting upwards). Wears CGM but did not have it on for several weeks due to device malfunction, now working correctly. All recent sugars in last few days have exceeded 200s. No recent hypoglycemia  R ear stopped up - diminished hearing in R ear for several weeks. Brought this up at very end of visit.   OBJECTIVE:   BP 114/64   Pulse 69   Ht 5\' 10"  (1.778 m)   Wt 167 lb (75.8 kg)   SpO2 100%   BMI 23.96 kg/m  Gen: no acute distress, pleasant, cooperative HEENT: normocephalic, atraumatic  Heart: regular rate and rhythm Lungs: clear to auscultation bilaterally normal work of breathing  Neuro: alert, speech normal, grossly nonfocal  ASSESSMENT/PLAN:   Assessment & Plan Type 2 diabetes mellitus with diabetic mononeuropathy, with long-term current use of insulin (HCC) Uncontrolled with A1c of 10 Increase lantus to 35 units daily Advised not to self increase novolog, ok to do 15u twice daily Follow up in several weeks Routine adult health maintenance Will send records request for last eye exam ([facility]) States will get shingrix #2 at some point Had lipids earlier this year at [place], reviewed records in chart Hearing decreased, right Normal exam today Refer to audiology per patient request    FOLLOW UP: Follow up in several weeks for diabetes   [name], MD [facility]

## 2023-04-28 NOTE — Patient Instructions (Signed)
It was great to see you again today.  Increase Lantus to 35 units daily Take novolog 15 units twice daily  Keep glucose monitor on at all times to make sure sugar does not drop too low  Follow up with me in a few weeks, sooner if needed  Be well, Dr. Pollie Meyer

## 2023-04-29 ENCOUNTER — Inpatient Hospital Stay: Payer: 59

## 2023-04-29 ENCOUNTER — Telehealth: Payer: Self-pay | Admitting: Orthopedic Surgery

## 2023-04-29 DIAGNOSIS — D519 Vitamin B12 deficiency anemia, unspecified: Secondary | ICD-10-CM

## 2023-04-29 DIAGNOSIS — E538 Deficiency of other specified B group vitamins: Secondary | ICD-10-CM | POA: Diagnosis not present

## 2023-04-29 NOTE — Telephone Encounter (Signed)
Pt wife Marylene Land called in stating she spoke with Dr. Lajoyce Corners about getting paperwork filled out so she can be his caretaker and be paid for doing so they just need the paperwork filled out no appt needed he a;ready has one set for 11/11 but would like to speak with a nurse when bringing paperwork, please advise

## 2023-04-30 ENCOUNTER — Telehealth: Payer: Self-pay | Admitting: Family Medicine

## 2023-04-30 DIAGNOSIS — Z0000 Encounter for general adult medical examination without abnormal findings: Secondary | ICD-10-CM | POA: Insufficient documentation

## 2023-04-30 LAB — INTRINSIC FACTOR ANTIBODIES: Intrinsic Factor: 1 [AU]/ml (ref 0.0–1.1)

## 2023-04-30 NOTE — Telephone Encounter (Addendum)
After-hours call  Patient's wife into the phone he confirmed date of birth.  States patient's glucose monitor was reading "high."  He had given himself 20 units of NovoLog.  I asked her to recheck at which time the meter said "396."  Patient is on Lantus 35 units daily in the morning and NovoLog 15 units twice daily.  This morning he took his Lantus as prescribed.  He also took 12 units of NovoLog this morning.  I advised patient to take 5 more units of NovoLog and to give me a call back in 1 hour to assess where CBG was.  Reassuringly, patient without mental status changes, nausea, vomiting, bowel changes.  He is eating normally.  He has also been urinating normally.  Will follow-up CBG in 1 hour after total of 25 units aspart.  ADDENDUM: Spoke with patient 1 hour later.  CBG improved at 314.  Unable to follow-up in clinic tomorrow, 10/31.  No appointments available for Friday 11/1.  Appointment scheduled with Dr. Barb Merino on 11/4 at 9:30 AM for further assessment of glucose control.  Advised to return to care sooner should the the symptoms mentioned above occur.  Patient's wife thankful and amenable.

## 2023-04-30 NOTE — Assessment & Plan Note (Signed)
Uncontrolled with A1c of 10 Increase lantus to 35 units daily Advised not to self increase novolog, ok to do 15u twice daily Follow up in several weeks

## 2023-04-30 NOTE — Assessment & Plan Note (Signed)
Will send records request for last eye exam ([facility]) States will get shingrix #2 at some point Had lipids earlier this year at [place], reviewed records in chart

## 2023-05-01 ENCOUNTER — Encounter: Payer: Self-pay | Admitting: Hematology

## 2023-05-01 ENCOUNTER — Other Ambulatory Visit: Payer: Self-pay

## 2023-05-01 ENCOUNTER — Inpatient Hospital Stay: Payer: 59

## 2023-05-01 ENCOUNTER — Inpatient Hospital Stay (HOSPITAL_BASED_OUTPATIENT_CLINIC_OR_DEPARTMENT_OTHER): Payer: 59 | Admitting: Hematology

## 2023-05-01 VITALS — BP 125/63 | HR 71 | Temp 97.6°F | Resp 18 | Wt 168.0 lb

## 2023-05-01 DIAGNOSIS — E538 Deficiency of other specified B group vitamins: Secondary | ICD-10-CM | POA: Diagnosis not present

## 2023-05-01 DIAGNOSIS — D519 Vitamin B12 deficiency anemia, unspecified: Secondary | ICD-10-CM

## 2023-05-01 DIAGNOSIS — D61818 Other pancytopenia: Secondary | ICD-10-CM | POA: Diagnosis not present

## 2023-05-01 MED ORDER — CYANOCOBALAMIN 1000 MCG/ML IJ SOLN
1000.0000 ug | Freq: Once | INTRAMUSCULAR | Status: AC
  Administered 2023-05-01: 1000 ug via INTRAMUSCULAR
  Filled 2023-05-01: qty 1

## 2023-05-01 MED ORDER — CYANOCOBALAMIN 1000 MCG/ML IJ SOLN: 1000.0000 ug | INTRAMUSCULAR | 2 refills | Status: DC

## 2023-05-01 NOTE — Patient Instructions (Signed)
 Vitamin B12 Injection What is this medication? Vitamin B12 (VAHY tuh min B12) prevents and treats low vitamin B12 levels in your body. It is used in people who do not get enough vitamin B12 from their diet or when their digestive tract does not absorb enough. Vitamin B12 plays an important role in maintaining the health of your nervous system and red blood cells. This medicine may be used for other purposes; ask your health care provider or pharmacist if you have questions. COMMON BRAND NAME(S): B-12 Compliance Kit, B-12 Injection Kit, Cyomin, Dodex, LA-12, Nutri-Twelve, Physicians EZ Use B-12, Primabalt, Vitamin Deficiency Injectable System - B12 What should I tell my care team before I take this medication? They need to know if you have any of these conditions: Kidney disease Leber's disease Megaloblastic anemia An unusual or allergic reaction to cyanocobalamin, cobalt, other medications, foods, dyes, or preservatives Pregnant or trying to get pregnant Breast-feeding How should I use this medication? This medication is injected into a muscle or deeply under the skin. It is usually given in a clinic or care team's office. However, your care team may teach you how to inject yourself. Follow all instructions. Talk to your care team about the use of this medication in children. Special care may be needed. Overdosage: If you think you have taken too much of this medicine contact a poison control center or emergency room at once. NOTE: This medicine is only for you. Do not share this medicine with others. What if I miss a dose? If you are given your dose at a clinic or care team's office, call to reschedule your appointment. If you give your own injections, and you miss a dose, take it as soon as you can. If it is almost time for your next dose, take only that dose. Do not take double or extra doses. What may interact with this medication? Alcohol Colchicine This list may not describe all possible  interactions. Give your health care provider a list of all the medicines, herbs, non-prescription drugs, or dietary supplements you use. Also tell them if you smoke, drink alcohol, or use illegal drugs. Some items may interact with your medicine. What should I watch for while using this medication? Visit your care team regularly. You may need blood work done while you are taking this medication. You may need to follow a special diet. Talk to your care team. Limit your alcohol intake and avoid smoking to get the best benefit. What side effects may I notice from receiving this medication? Side effects that you should report to your care team as soon as possible: Allergic reactions--skin rash, itching, hives, swelling of the face, lips, tongue, or throat Swelling of the ankles, hands, or feet Trouble breathing Side effects that usually do not require medical attention (report to your care team if they continue or are bothersome): Diarrhea This list may not describe all possible side effects. Call your doctor for medical advice about side effects. You may report side effects to FDA at 1-800-FDA-1088. Where should I keep my medication? Keep out of the reach of children. Store at room temperature between 15 and 30 degrees C (59 and 85 degrees F). Protect from light. Throw away any unused medication after the expiration date. NOTE: This sheet is a summary. It may not cover all possible information. If you have questions about this medicine, talk to your doctor, pharmacist, or health care provider.  2024 Elsevier/Gold Standard (2021-02-27 00:00:00)

## 2023-05-01 NOTE — Progress Notes (Signed)
[facility]   Telephone:([phone]) [phone] Fax:([phone]) [phone]   Clinic Follow up Note   Patient Care Team: Latrelle Dodrill, MD as PCP - General (Family Medicine) Runell Gess, MD as PCP - Cardiology (Cardiology)  Date of Service:  05/01/2023  CHIEF COMPLAINT: f/u of pancytopenia  CURRENT THERAPY:  B12 injection every months   Assessment and Plan    Pancytopenia -He had mild chronic anemia in the past, but got much worse lately, also developed a mild neutropenia and thrombocytopenia in the past year.  -Anemia workup in early August 2024 showed normal iron study, folate level, B12 level was slightly low 221, normal immature platelet, episodes of reticulocyte count 88, his anemia improved hemoglobin 11.7, mild leukopenia and thrombocytopenia. -Mild improvement in blood counts since last visit. B12 level slightly low, currently on B12 injections. -Continue B12 injections monthly. -Check blood counts and B12 level in 3 months. -If no improvement or worsening, consider bone marrow biopsy.  Diabetes Mellitus Poorly controlled with recent A1c of 10.0. Patient does not follow a diabetic diet and is not under the care of a specialist. -Recommend patient discuss with primary care physician about seeing an endocrinologist and starting additional diabetic medications. -Encourage patient to follow a diabetic diet to help control blood sugars.  General Health Maintenance -Last colonoscopy in 2018, due for next one in 2028 unless symptoms arise. -Continue monitoring PSA as part of cancer screening.      Plan -Will proceed to B12 injection today and continue every months -Patient wants to do B12 injection at home, I called a prescription to his pharmacy -Lab, follow-up and injection in 3 months    Discussed the use of AI scribe software for clinical note transcription with the patient, who gave verbal consent to proceed.  History of Present Illness   A 67 year old  patient with a history of pancytopenia and diabetes presents for a follow-up visit. The patient reports fluctuating blood sugar levels, with recent A1c levels ranging from 6.5 to 14. Despite these fluctuations, the patient admits to not adhering to a controlled diet and consuming any food he desires. The patient is currently under the care of a family doctor for his diabetes but expresses a desire to see a specialist due to the difficulty in managing his condition.  In addition to his diabetes, the patient has been receiving B12 injections due to low B12 levels. The patient received his second dose of B12 during this visit and has expressed interest in self-administering the injections at home. The patient has previously administered insulin injections and feels confident in his ability to administer the B12 injections. The patient is also taking oral B12 supplements.  The patient has a history of low blood counts across all three types of blood cells - white blood cells, red blood cells, and platelets. The patient's blood counts have shown some improvement since his last visit in September, but he is not yet back to normal. The patient is hopeful that the B12 injections will help improve his blood counts.         All other systems were reviewed with the patient and are negative.  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   Status post transmetatarsal  amputation of foot, 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/2018   STEMI involving left circumflex coronary artery (HCC) 07/12/2018   Coronary artery disease   Subacute osteomyelitis, right ankle and foot (HCC) 01/29/2016   Testicular mass 04/18/2016    SURGICAL HISTORY: Past Surgical History:  Procedure Laterality Date   AMPUTATION Right 01/31/2016   Procedure: Right 2nd Toe Amputation;  Surgeon: [name], MD;  Location: [facility] OR;  Service: Orthopedics;  Laterality: Right;   AMPUTATION Right 07/08/2018   Procedure: RIGHT TRANSMETATARSAL AMPUTATION;  Surgeon: [name], MD;  Location: [facility] OR;  Service: Orthopedics;  Laterality: Right;   AMPUTATION Right 01/05/2020   Procedure: RIGHT BELOW KNEE AMPUTATION;  Surgeon: [name], MD;  Location: [facility] OR;  Service: Orthopedics;  Laterality: Right;   AMPUTATION Right 02/23/2020   Procedure: RIGHT BELOW KNEE AMPUTATION REVISION;  Surgeon: [name], MD;  Location: [facility] OR;  Service: Orthopedics;  Laterality: Right;   AMPUTATION Left 10/11/2022   Procedure: LEFT GREAT TOE AMPUTATION AND LEFT 2ND TOE AMPUTATION;  Surgeon: [name], MD;  Location: [facility] OR;  Service: Orthopedics;  Laterality: Left;   AMPUTATION Left 12/25/2022   Procedure: LEFT TRANSMETATARSAL AMPUTATION;  Surgeon: [name], MD;  Location: [facility] OR;  Service: Orthopedics;  Laterality: Left;   CATARACT EXTRACTION     right eye   COLONOSCOPY     CORONARY STENT INTERVENTION N/A 07/12/2018   Procedure: CORONARY STENT INTERVENTION;  Surgeon: Lennette Bihari, MD;  Location: [facility] INVASIVE CV LAB;  Service: Cardiovascular;  Laterality: N/A;   CORONARY/GRAFT ACUTE MI REVASCULARIZATION N/A 07/12/2018   Procedure: Coronary/Graft Acute MI Revascularization;  Surgeon: Lennette Bihari, MD;  Location: [facility] INVASIVE CV LAB;  Service: Cardiovascular;  Laterality: N/A;   I & D EXTREMITY  04/11/2012   Procedure: IRRIGATION AND DEBRIDEMENT EXTREMITY;  Surgeon: Toni Arthurs, MD;  Location: [facility] OR;  Service: Orthopedics;   Laterality: Right;   LEFT HEART CATH AND CORONARY ANGIOGRAPHY N/A 07/12/2018   Procedure: LEFT HEART CATH AND CORONARY ANGIOGRAPHY;  Surgeon: Lennette Bihari, MD;  Location: [facility] INVASIVE CV LAB;  Service: Cardiovascular;  Laterality: N/A;   Surgery left great toe     Tear ducts bilateral eyes     TRANSMETATARSAL AMPUTATION Right 07/08/2018    I have reviewed the social history and family history with the patient and they are unchanged from previous note.  ALLERGIES:  is allergic to codeine, latex, morphine, and propofol.  MEDICATIONS:  Current Outpatient Medications  Medication Sig Dispense Refill   cyanocobalamin (VITAMIN B12) 1000 MCG/ML injection Inject 1 mL (1,000 mcg total) into the muscle every 30 (thirty) days. 1 mL 2   acetaminophen (TYLENOL) 500 MG tablet Take 1,500 mg by mouth every 6 (six) hours as needed for moderate pain, mild pain or headache.     aspirin 81 MG chewable tablet Chew 81 mg by mouth daily.     Cholecalciferol (VITAMIN D3) 50 MCG (2000 UT) TABS Take 2,000 Units by mouth daily.     CINNAMON PO Take  1,200 mg by mouth 2 (two) times daily. [place]     Continuous Glucose Receiver (FREESTYLE LIBRE 3 READER) DEVI 1 each by Does not apply route as directed. Use with sensor to monitor blood sugar 1 each 0   Continuous Glucose Sensor (FREESTYLE LIBRE 3 SENSOR) MISC 1 each by Does not apply route every 14 (fourteen) days. Place 1 sensor on the skin every 14 days. Use to check glucose continuously 2 each 11   famotidine (PEPCID) 20 MG tablet TAKE 1 TABLET (20 MG TOTAL) BY MOUTH 2 (TWO) TIMES DAILY AS NEEDED FOR HEARTBURN OR INDIGESTION. 180 tablet 1   finasteride (PROSCAR) 5 MG tablet Take 5 mg by mouth daily.     furosemide (LASIX) 20 MG tablet TAKE 1 TABLET BY MOUTH TWICE A DAY 180 tablet 3   insulin aspart (NOVOLOG) 100 UNIT/ML injection Inject 15 Units into the skin 2 (two) times daily.     insulin glargine (LANTUS SOLOSTAR) 100 UNIT/ML Solostar Pen Inject 35 Units into the  skin daily.     metFORMIN (GLUCOPHAGE) 500 MG tablet TAKE 1 TABLET BY MOUTH 2 TIMES DAILY WITH A MEAL. (Patient taking differently: Take 500 mg by mouth 2 (two) times daily with a meal.) 180 tablet 1   metoprolol tartrate (LOPRESSOR) 25 MG tablet Take 25 mg by mouth 2 (two) times daily.     Multiple Vitamin (MULTIVITAMIN WITH MINERALS) TABS tablet Take 1 tablet by mouth daily.  iron     rosuvastatin (CRESTOR) 40 MG tablet Take 1 tablet (40 mg total) by mouth daily at 6 PM. (Patient taking differently: Take 20 mg by mouth daily at 6 PM.)     sildenafil (VIAGRA) 100 MG tablet Take 100 mg by mouth daily as needed for erectile dysfunction.     tamsulosin (FLOMAX) 0.4 MG CAPS capsule Take 1 capsule (0.4 mg total) by mouth daily. 30 capsule 3   tolterodine (DETROL LA) 4 MG 24 hr capsule Take 4 mg by mouth daily.     Vibegron (GEMTESA) 75 MG TABS Take 75 mg by mouth daily.     No current facility-administered medications for this visit.    PHYSICAL EXAMINATION: ECOG PERFORMANCE STATUS: 2 - Symptomatic, <50% confined to bed  Vitals:   05/01/23 1403  BP: 125/63  Pulse: 71  Resp: 18  Temp: 97.6 F (36.4 C)  SpO2: 96%   Wt Readings from Last 3 Encounters:  05/01/23 168 lb (76.2 kg)  04/28/23 167 lb (75.8 kg)  03/31/23 164 lb 12.8 oz (74.8 kg)     GENERAL:alert, no distress and comfortable SKIN: skin color, texture, turgor are normal, no rashes or significant lesions EYES: normal, Conjunctiva are pink and non-injected, sclera clear Musculoskeletal:no cyanosis of digits and no clubbing  NEURO: alert & oriented x 3 with fluent speech, no focal motor/sensory deficits      LABORATORY DATA:  I have reviewed the data as listed    Latest Ref Rng & Units 03/13/2023    5:13 PM 02/01/2023    9:21 AM 12/27/2022   11:42 PM  CBC  WBC 3.4 - 10.8 x10E3/uL 3.3  3.7    Hemoglobin 13.0 - 17.7 g/dL 18.2  99.3  8.8   Hematocrit 37.5 - 51.0 % 34.2  35.0  26.0   Platelets 150 - 450 x10E3/uL 134  123           Latest Ref Rng & Units 12/27/2022   11:42 PM 12/27/2022    7:37 PM 12/16/2022  5:12 PM  CMP  Glucose 70 - 99 mg/dL  161  096   BUN 8 - 23 mg/dL  16  16   Creatinine 0.45 - 1.24 mg/dL  4.09  8.11   Sodium 914 - 145 mmol/L 134  132  141   Potassium 3.5 - 5.1 mmol/L 3.6  3.5  4.0   Chloride 98 - 111 mmol/L  103  102   CO2 22 - 32 mmol/L  22  26   Calcium 8.9 - 10.3 mg/dL  7.1  9.0   Total Protein 6.0 - 8.5 g/dL   6.2   Total Bilirubin 0.0 - 1.2 mg/dL   0.3   Alkaline Phos 44 - 121 IU/L   92   AST 0 - 40 IU/L   25   ALT 0 - 44 IU/L   20       RADIOGRAPHIC STUDIES: I have personally reviewed the radiological images as listed and agreed with the findings in the report. No results found.    No orders of the defined types were placed in this encounter.  All questions were answered. The patient knows to call the clinic with any problems, questions or concerns. No barriers to learning was detected. The total time spent in the appointment was 20 minutes.     Malachy Mood, MD 05/01/2023

## 2023-05-01 NOTE — Telephone Encounter (Signed)
Noted. We will look at the forms and advise at time of visit.

## 2023-05-02 ENCOUNTER — Telehealth: Payer: Self-pay | Admitting: Hematology

## 2023-05-02 NOTE — Telephone Encounter (Signed)
Spoke with patient wife confirming upcoming appointment  

## 2023-05-05 ENCOUNTER — Encounter: Payer: Self-pay | Admitting: Family Medicine

## 2023-05-05 ENCOUNTER — Ambulatory Visit (INDEPENDENT_AMBULATORY_CARE_PROVIDER_SITE_OTHER): Payer: 59 | Admitting: Family Medicine

## 2023-05-05 ENCOUNTER — Ambulatory Visit (HOSPITAL_COMMUNITY): Payer: 59 | Attending: Adult Health

## 2023-05-05 VITALS — BP 126/67 | HR 62 | Wt 170.5 lb

## 2023-05-05 DIAGNOSIS — Z794 Long term (current) use of insulin: Secondary | ICD-10-CM

## 2023-05-05 DIAGNOSIS — R739 Hyperglycemia, unspecified: Secondary | ICD-10-CM | POA: Diagnosis not present

## 2023-05-05 DIAGNOSIS — I059 Rheumatic mitral valve disease, unspecified: Secondary | ICD-10-CM | POA: Diagnosis not present

## 2023-05-05 DIAGNOSIS — E1141 Type 2 diabetes mellitus with diabetic mononeuropathy: Secondary | ICD-10-CM

## 2023-05-05 LAB — ECHOCARDIOGRAM COMPLETE
AR max vel: 1.42 cm2
AV Area VTI: 1.53 cm2
AV Area mean vel: 1.34 cm2
AV Mean grad: 20 mm[Hg]
AV Peak grad: 32.7 mm[Hg]
Ao pk vel: 2.86 m/s
Area-P 1/2: 4.49 cm2
S' Lateral: 2.4 cm
Weight: 2728 [oz_av]

## 2023-05-05 LAB — GLUCOSE, POCT (MANUAL RESULT ENTRY): POC Glucose: 301 mg/dL — AB (ref 70–99)

## 2023-05-05 MED ORDER — LANTUS SOLOSTAR 100 UNIT/ML ~~LOC~~ SOPN
40.0000 [IU] | PEN_INJECTOR | Freq: Every day | SUBCUTANEOUS | 0 refills | Status: DC
Start: 2023-05-05 — End: 2023-06-03

## 2023-05-05 NOTE — Assessment & Plan Note (Signed)
Uncontrolled on current regimen.  Increase Lantus to 40 units daily.  Continue NovoLog 15 units twice daily.  Continue metformin.  Patient reports that he occasionally takes 20 units of NovoLog in the evenings which I am comfortable with him doing.  Discussed close monitoring of CBGs.  Follow-up in 1 week

## 2023-05-05 NOTE — Progress Notes (Signed)
    SUBJECTIVE:   CHIEF COMPLAINT / HPI:   Called the after-hours line 10/30 due to elevated CBGs to 396  T2DM -Current medication regimen: Lantus 35 units daily qAM, NovoLog 15 units twice daily. Metformin 500mg  BID -Home CBGs: elevated, fasting above 300 for the past several days -Denies polyuria, polydipsia, abdominal pain, chest pain, shortness of breath  Lab Results  Component Value Date   HGBA1C 10.0 (A) 04/28/2023   HGBA1C 10.3 (A) 02/06/2023   HGBA1C 6.6 11/18/2022    PERTINENT  PMH / PSH: T2DM  OBJECTIVE:   BP 126/67   Pulse 62   Wt 170 lb 8 oz (77.3 kg)   SpO2 100%   BMI 24.46 kg/m    General: NAD, in wheelchair/scooter Respiratory: No respiratory distress Skin: warm and dry, no rashes noted Psych: Normal affect and mood  ASSESSMENT/PLAN:   Assessment & Plan Type 2 diabetes mellitus with diabetic mononeuropathy, with long-term current use of insulin (HCC) Uncontrolled on current regimen.  Increase Lantus to 40 units daily.  Continue NovoLog 15 units twice daily.  Continue metformin.  Patient reports that he occasionally takes 20 units of NovoLog in the evenings which I am comfortable with him doing.  Discussed close monitoring of CBGs.  Follow-up in 1 week   Vonna Drafts, MD [facility]

## 2023-05-05 NOTE — Patient Instructions (Signed)
Please take 40 units of Lantus every morning and continue to check your blood sugar every day  Please continue taking 15 units of novolog twice daily. You can take 20 units in the evenings if your blood sugar is elevated.

## 2023-05-06 ENCOUNTER — Telehealth: Payer: Self-pay

## 2023-05-06 NOTE — Telephone Encounter (Addendum)
Called patient regarding results. Patient had understanding of results.----- Message from Joni Reining sent at 05/06/2023  7:35 AM EST ----- I have reviewed the echo report.  He has worsening aortic valve stenosis moving to moderate from mild. Will need to watch this more closely with every 6 months echo.  Will not send for repair unless his symptoms change.

## 2023-05-09 ENCOUNTER — Ambulatory Visit: Payer: No Typology Code available for payment source | Attending: Adult Health | Admitting: Adult Health

## 2023-05-09 ENCOUNTER — Encounter: Payer: Self-pay | Admitting: Adult Health

## 2023-05-09 VITALS — BP 122/60 | HR 67 | Ht 70.0 in | Wt 166.8 lb

## 2023-05-09 DIAGNOSIS — Z794 Long term (current) use of insulin: Secondary | ICD-10-CM | POA: Diagnosis not present

## 2023-05-09 DIAGNOSIS — I350 Nonrheumatic aortic (valve) stenosis: Secondary | ICD-10-CM | POA: Diagnosis not present

## 2023-05-09 DIAGNOSIS — E1141 Type 2 diabetes mellitus with diabetic mononeuropathy: Secondary | ICD-10-CM

## 2023-05-09 DIAGNOSIS — I739 Peripheral vascular disease, unspecified: Secondary | ICD-10-CM

## 2023-05-09 DIAGNOSIS — E7800 Pure hypercholesterolemia, unspecified: Secondary | ICD-10-CM | POA: Diagnosis not present

## 2023-05-09 LAB — HM DIABETES EYE EXAM

## 2023-05-09 NOTE — Patient Instructions (Signed)
Medication Instructions:  No Changes *If you need a refill on your cardiac medications before your next appointment, please call your pharmacy*   Lab Work: No labs If you have labs (blood work) drawn today and your tests are completely normal, you will receive your results only by: MyChart Message (if you have MyChart) OR A paper copy in the mail If you have any lab test that is abnormal or we need to change your treatment, we will call you to review the results.   Testing/Procedures: Your physician has requested that you have an echocardiogram. Echocardiography is a painless test that uses sound waves to create images of your heart. It provides your doctor with information about the size and shape of your heart and how well your heart's chambers and valves are working. This procedure takes approximately one hour. There are no restrictions for this procedure. Please do NOT wear cologne, perfume, aftershave, or lotions (deodorant is allowed). Please arrive 15 minutes prior to your appointment time.  Please note: We ask at that you not bring children with you during ultrasound (echo/ vascular) testing. Due to room size and safety concerns, children are not allowed in the ultrasound rooms during exams. Our front office staff cannot provide observation of children in our lobby area while testing is being conducted. An adult accompanying a patient to their appointment will only be allowed in the ultrasound room at the discretion of the ultrasound technician under special circumstances. We apologize for any inconvenience.    Follow-Up: At [facility], you and your health needs are our priority.  As part of our continuing mission to provide you with exceptional heart care, we have created designated Provider Care Teams.  These Care Teams include your primary Cardiologist (physician) and Advanced Practice Providers (APPs -  Physician Assistants and Nurse Practitioners) who all work together  to provide you with the care you need, when you need it.  We recommend signing up for the patient portal called "MyChart".  Sign up information is provided on this After Visit Summary.  MyChart is used to connect with patients for Virtual Visits (Telemedicine).  Patients are able to view lab/test results, encounter notes, upcoming appointments, etc.  Non-urgent messages can be sent to your provider as well.   To learn more about what you can do with MyChart, go to [email or web address].    Your next appointment:   6 month(s)  Provider:   Nanetta Batty, MD

## 2023-05-09 NOTE — Progress Notes (Signed)
Cardiology Office Note:  .   Date:  05/09/2023  ID:  RAFAN MIAL, DOB [birth year], MRN [id number] PCP: Latrelle Dodrill, MD  [facility] Providers Cardiologist:  Nanetta Batty, MD { History of Present Illness: [name]   Adam Ray is a 67 y.o. male ongoing assessment and management of PAD, hypertension, and has been told that he is a heart murmur. He has a history of bicuspid aortic valve, hypertension, and COPD. 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   When last seen by me in August 2024 I ordered echocardiogram for evaluation of LV function and valvular abnormalities.  Echocardiogram was completed on 05/05/2023.  This revealed LVEF of 7075% with hyperdynamic function mild left ventricular hypertrophy, mild mitral valve regurgitation with moderate mitral annular calcification, aortic valve was thickened and calcified with restricted motion, gradient in the valve are 32 and 20 mm respectively.  AVA 1.52 cm.  Consistent with moderate aortic valve stenosis.  The patient was without complaints of chest pain or shortness of breath currently.  He uses a cane for ambulation but is not very mobile.  He continues to have some chronic left lower leg edema for which she is wearing support hose.  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   ROS: As above otherwise negative  Studies Reviewed: .   Echocardiogram 05/05/2023  1. Left ventricular ejection fraction, by estimation, is 70 to 75%. The  left ventricle has hyperdynamic function. The left ventricle has no  regional wall motion abnormalities. There is mild left ventricular  hypertrophy.   2. Right ventricular systolic function is normal. The right ventricular  size is normal. There is normal pulmonary artery systolic pressure.   3. Mild mitral valve regurgitation. Moderate mitral annular  calcification.   4. AV is thickened, calcified with restricted motion. Peak and  mean  gradients through the valve are 32 and 20 mm HG respectively. AVA (VTI) is  1.52 cm2. Dimensionless index is 0.34. Overall consistent with moderate  AS. This is new wen compared to report   from 2020. The aortic valve is tricuspid. Aortic valve regurgitation is  not visualized.   5. The inferior vena cava is normal in size with greater than 50%  respiratory variability, suggesting right atrial pressure of 3 mmHg.     LHC with PCI  07/12/2018 Prox Cx to Mid Cx lesion is 95% stenosed. Post intervention, there is a 0% residual stenosis. A stent was successfully placed.   Acute inferior ST segment elevation myocardial infarction secondary to subtotal occlusion of a codominant mid left circumflex coronary artery.   Normal LAD and RCA.   LVEDP 20 mmHg.   Successful PCI to the mid left circumflex coronary artery with ultimate insertion of a 2.25 x 24 mm Synergy DES stent postdilated to 2.4 mm with the stenosis being reduced to 0%.   RECOMMENDATION: DAPT therapy for a minimum of 12 months of aspirin/Brilinta.  Optimal blood pressure control.  Aggressive lipid intervention with target LDL less than 70.    Physical Exam:   VS:  BP 122/60   Pulse 67   Ht 5\' 10"  (1.778 m)   Wt 166 lb 12.8 oz (75.7 kg)   SpO2 97%   BMI 23.93 kg/m     Wt Readings from Last 3 Encounters:  05/09/23 166 lb 12.8 oz (75.7 kg)  05/05/23 170 lb 8 oz (77.3 kg)  05/01/23 168 lb (76.2 kg)    GEN: Well nourished, well developed in no acute distress NECK: No JVD; No carotid bruits CARDIAC: RRR, 2/6 systolic murmur nonradiating into the carotids at the left sternal border, soft systolic murmur at the apex, no rubs, gallops RESPIRATORY:  Clear to auscultation without rales, wheezing or rhonchi  ABDOMEN: Soft, non-tender, non-distended EXTREMITIES: Edema of the left lower leg wearing support hose, right AKA,  ASSESSMENT AND PLAN: .    Moderate aortic valve stenosis: The patient is asymptomatic for chest  pain or shortness of breath.  He is very sedentary.  Uses a cane for ambulation or a scooter when he is out of his home.  Will repeat his echocardiogram in 6 months to evaluate for progression of stenosis.  With hyperdynamic LV function, would consider increasing metoprolol from 25 mg daily to 50 mg daily for better cardiac output.  Today's heart rate is in the 60s.  Will follow this.  2.  Hypercholesterolemia: Remains on rosuvastatin 40 mg daily.  Fasting lipids and LFTs to be completed on next office visit if not done through primary care.  Goal of LDL less than 70 with PAD.  3.  PAD: Status post right AKA.  Has prosthesis.  Uses walker for cane for ambulation.  Continue to be seen through vein and vascular.         Signed, Bettey Mare. Liborio Nixon, ANP, AACC

## 2023-05-12 ENCOUNTER — Ambulatory Visit (INDEPENDENT_AMBULATORY_CARE_PROVIDER_SITE_OTHER): Payer: 59 | Admitting: Orthopedic Surgery

## 2023-05-12 DIAGNOSIS — Z89432 Acquired absence of left foot: Secondary | ICD-10-CM

## 2023-05-13 ENCOUNTER — Telehealth: Payer: Self-pay | Admitting: Orthopedic Surgery

## 2023-05-13 ENCOUNTER — Encounter: Payer: Self-pay | Admitting: Orthopedic Surgery

## 2023-05-13 NOTE — Progress Notes (Signed)
Office Visit Note   Patient: Adam Ray           Date of Birth: Jan 18, 1956           MRN: [id number] Visit Date: 05/12/2023              Requested by: Latrelle Dodrill, MD [address] [place],  [place] [address] PCP: Latrelle Dodrill, MD  Chief Complaint  Patient presents with   Left Foot - Follow-up    12/23/22 left TMA      HPI: Patient is a 67 year old gentleman who presents in follow-up for ulceration left transmetatarsal amputation.  Patient is currently wearing Ted compression stockings.  Assessment & Plan: Visit Diagnoses:  1. History of transmetatarsal amputation of left foot (HCC)     Plan: Recommended continue elevation and compression.  Dry dressing changes daily.  Follow-Up Instructions: Return in about 4 weeks (around 06/09/2023).   Ortho Exam  Patient is alert, oriented, no adenopathy, well-dressed, normal affect, normal respiratory effort. Examination patient's ill has significant pitting edema in the left lower extremity but there has been an improvement.  With the decreased swelling the ulcer is now flat with 100% healthy granulation tissue.  This is approximately 2 cm in diameter.  There is no tunneling no cellulitis no odor or drainage.  Imaging: No results found. No images are attached to the encounter.  Labs: Lab Results  Component Value Date   HGBA1C 10.0 (A) 04/28/2023   HGBA1C 10.3 (A) 02/06/2023   HGBA1C 6.6 11/18/2022   ESRSEDRATE 54 (H) 01/01/2020   ESRSEDRATE 8 01/29/2016   ESRSEDRATE 9 04/11/2012   CRP 2.6 (H) 01/29/2016   CRP 0.5 02/28/2014   REPTSTATUS 01/18/2020 FINAL 01/17/2020   REPTSTATUS 01/22/2020 FINAL 01/17/2020   REPTSTATUS 01/22/2020 FINAL 01/17/2020   GRAMSTAIN  04/11/2012    NO WBC SEEN RARE SQUAMOUS EPITHELIAL CELLS PRESENT MODERATE GRAM POSITIVE COCCI IN PAIRS RARE GRAM NEGATIVE RODS   GRAMSTAIN  04/11/2012    NO WBC SEEN RARE SQUAMOUS EPITHELIAL CELLS PRESENT MODERATE GRAM POSITIVE COCCI IN  PAIRS RARE GRAM NEGATIVE RODS   CULT (A) 01/17/2020    <10,000 COLONIES/mL INSIGNIFICANT GROWTH Performed at [facility], [address] [place], [place] [address]    CULT  01/17/2020    NO GROWTH 5 DAYS Performed at [facility], [address], [place], [place] [address]    CULT  01/17/2020    NO GROWTH 5 DAYS Performed at [facility], [address], [place], [place] [address]    LABORGA NO GROWTH 2 DAYS 07/19/2014     Lab Results  Component Value Date   ALBUMIN 4.1 12/16/2022   ALBUMIN 4.5 01/17/2022   ALBUMIN 4.1 12/06/2020    Lab Results  Component Value Date   MG 1.5 (L) 12/27/2022   No results found for: "VD25OH"  No results found for: "PREALBUMIN"    Latest Ref Rng & Units 03/13/2023    5:13 PM 02/01/2023    9:21 AM 12/27/2022   11:42 PM  CBC EXTENDED  WBC 3.4 - 10.8 x10E3/uL 3.3  3.7    RBC 4.14 - 5.80 x10E6/uL 4.12  4.27    4.25    Hemoglobin 13.0 - 17.7 g/dL 65.7  84.6  8.8   HCT 96.2 - 51.0 % 34.2  35.0  26.0   Platelets 150 - 450 x10E3/uL 134  123    NEUT# 1.7 - 7.7 K/uL  1.7  Lymph# 0.7 - 4.0 K/uL  1.4       There is no height or weight on file to calculate BMI.  Orders:  No orders of the defined types were placed in this encounter.  No orders of the defined types were placed in this encounter.    Procedures: No procedures performed  Clinical Data: No additional findings.  ROS:  All other systems negative, except as noted in the HPI. Review of Systems  Objective: Vital Signs: There were no vitals taken for this visit.  Specialty Comments:  No specialty comments available.  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   Atrophy of tongue papillae 10/02/2022   Atherosclerosis of coronary artery without angina pectoris 10/02/2022   Age-related nuclear cataract of left eye 10/02/2022   Manson Passey hairy tongue 10/02/2022   Proliferative diabetic retinopathy of right eye associated with type 2 diabetes mellitus (HCC) 10/02/2022   Tongue discoloration 02/14/2022   Hematuria 08/16/2021   Cervical spondylosis without myelopathy 12/14/2020   Pain in joint of left shoulder 12/14/2020   Urinary incontinence 08/10/2020   Increased urinary frequency 05/09/2020   Nocturnal leg cramps 10/28/2019   Hyperlipidemia associated with type 2 diabetes mellitus (HCC) 05/26/2019   Limited mobility 05/12/2018   Anxiety state 06/02/2017   Leg swelling 01/29/2017   Idiopathic chronic venous hypertension of both lower extremities with inflammation 09/24/2016   Diabetic polyneuropathy associated with type 2 diabetes mellitus (HCC)    Type 2 diabetes mellitus with neurologic complication, with long-term current use of insulin (HCC) 12/08/2015   Left shoulder pain 08/10/2015   Pulmonary nodule 02/01/2015   Depression 12/09/2013   Warts, genital 08/20/2013   Diabetic retinopathy (HCC) 01/28/2013   Sleep apnea 09/06/2010   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   Hypertension associated with diabetes (HCC) 11/09/2008   COPD, mild (HCC) 10/06/2006   SICKLE-CELL TRAIT 04/26/2006   ERECTILE DYSFUNCTION 04/26/2006   Tobacco use 04/26/2006   Peripheral arterial disease (HCC) 04/26/2006   Allergic rhinitis 04/26/2006   Past Medical History:  Diagnosis Date   Allergy    Arthritis    Bronchitis    Cataract    CHF (congestive heart failure) (HCC)    Chronic kidney disease    Claudication (HCC)    right  foot ray resection   Colon polyps    hyperplastic   COPD (chronic obstructive pulmonary disease) (HCC)    Coronary artery disease    Diabetes mellitus    type II   Genital warts    Gout    Hyperlipidemia    Hypertension    Myocardial infarction (HCC)    Osteomyelitis of third toe of right foot (HCC)    Pneumonia    Status post amputation of toe of right foot (HCC) 09/24/2016   Status post transmetatarsal amputation of foot, 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/2018   STEMI involving left circumflex coronary artery (HCC) 07/12/2018   Coronary artery disease   Subacute osteomyelitis, right ankle and foot (HCC) 01/29/2016  Testicular mass 04/18/2016    Family History  Problem Relation Age of Onset   Diabetes Mother    Stroke Mother    Heart failure Father    Heart attack Father    Diabetes Sister        multiple siblings   Diabetes Brother        muliple siblings   Heart disease Brother    Colon cancer Neg Hx    Esophageal cancer Neg Hx    Rectal cancer Neg Hx    Stomach cancer Neg Hx     Past Surgical History:  Procedure Laterality Date   AMPUTATION Right 01/31/2016   Procedure: Right 2nd Toe Amputation;  Surgeon: [name], MD;  Location: [facility] OR;  Service: Orthopedics;  Laterality: Right;   AMPUTATION Right 07/08/2018   Procedure: RIGHT TRANSMETATARSAL AMPUTATION;  Surgeon: [name], MD;  Location: [facility] OR;  Service: Orthopedics;  Laterality: Right;   AMPUTATION Right 01/05/2020   Procedure: RIGHT BELOW KNEE AMPUTATION;  Surgeon: [name], MD;  Location: [facility] OR;  Service: Orthopedics;  Laterality: Right;   AMPUTATION Right 02/23/2020   Procedure: RIGHT BELOW KNEE AMPUTATION REVISION;  Surgeon: [name], MD;  Location: [facility] OR;  Service: Orthopedics;  Laterality: Right;   AMPUTATION Left 10/11/2022   Procedure: LEFT GREAT TOE AMPUTATION AND LEFT 2ND TOE AMPUTATION;  Surgeon: [name], MD;  Location: [facility] OR;  Service: Orthopedics;  Laterality: Left;   AMPUTATION Left  12/25/2022   Procedure: LEFT TRANSMETATARSAL AMPUTATION;  Surgeon: [name], MD;  Location: [facility] OR;  Service: Orthopedics;  Laterality: Left;   CATARACT EXTRACTION     right eye   COLONOSCOPY     CORONARY STENT INTERVENTION N/A 07/12/2018   Procedure: CORONARY STENT INTERVENTION;  Surgeon: Lennette Bihari, MD;  Location: [facility];  Service: Cardiovascular;  Laterality: N/A;   CORONARY/GRAFT ACUTE MI REVASCULARIZATION N/A 07/12/2018   Procedure: Coronary/Graft Acute MI Revascularization;  Surgeon: Lennette Bihari, MD;  Location: [facility];  Service: Cardiovascular;  Laterality: N/A;   I & D EXTREMITY  04/11/2012   Procedure: IRRIGATION AND DEBRIDEMENT EXTREMITY;  Surgeon: Toni Arthurs, MD;  Location: [facility] OR;  Service: Orthopedics;  Laterality: Right;   LEFT HEART CATH AND CORONARY ANGIOGRAPHY N/A 07/12/2018   Procedure: LEFT HEART CATH AND CORONARY ANGIOGRAPHY;  Surgeon: Lennette Bihari, MD;  Location: [facility];  Service: Cardiovascular;  Laterality: N/A;   Surgery left great toe     Tear ducts bilateral eyes     TRANSMETATARSAL AMPUTATION Right 07/08/2018   Social History   Occupational History   Occupation: disabled  Tobacco Use   Smoking status: Former    Current packs/day: 0.00    Average packs/day: 0.3 packs/day for 55.9 years (16.8 ttl pk-yrs)    Types: Cigars, Cigarettes    Start date: 07/02/1963    Quit date: 06/01/2019    Years since quitting: 3.9    Passive exposure: Past   Smokeless tobacco: Former  Building services engineer status: Former  Substance and Sexual Activity   Alcohol use: Not Currently   Drug use: No   Sexual activity: Not on file

## 2023-05-13 NOTE — Telephone Encounter (Signed)
Patient's wife Marylene Land call asked if the paperwork she dropped off  yesterday was filled out. Marylene Land said the paperwork was for patient to continue riding [employer] and for her to continue taking care of him. The number to contact Marylene Land is [phone]

## 2023-05-14 ENCOUNTER — Ambulatory Visit: Payer: No Typology Code available for payment source | Admitting: Family Medicine

## 2023-05-14 ENCOUNTER — Ambulatory Visit: Payer: Self-pay | Admitting: Family Medicine

## 2023-05-14 NOTE — Telephone Encounter (Signed)
We are still working on these papers, were were a couple different packets to fill out. I told her before the end of visit that I would call once completed.

## 2023-05-14 NOTE — Telephone Encounter (Signed)
Adam Ray informed forms are at the front desk ready for pick up.

## 2023-05-16 ENCOUNTER — Ambulatory Visit: Payer: No Typology Code available for payment source | Admitting: Adult Health

## 2023-05-28 ENCOUNTER — Other Ambulatory Visit: Payer: Self-pay | Admitting: Family Medicine

## 2023-05-28 DIAGNOSIS — E1141 Type 2 diabetes mellitus with diabetic mononeuropathy: Secondary | ICD-10-CM

## 2023-06-02 DIAGNOSIS — R350 Frequency of micturition: Secondary | ICD-10-CM | POA: Diagnosis not present

## 2023-06-03 ENCOUNTER — Telehealth: Payer: Self-pay

## 2023-06-03 ENCOUNTER — Other Ambulatory Visit: Payer: Self-pay

## 2023-06-03 NOTE — Telephone Encounter (Signed)
[facility] Scheduling Team came by Dr. Latanya Maudlin office stating that pt contacted the Scheduling Team to schedule an appt for B12 injection but based on LOS sent by Dr. Mosetta Putt on 05/01/2023 pt was to do B12 injections at home.  Zella Ball stated the pt stated that pt's wife does not trust pt with administering the B12 injections at home.  Pt is currently scheduled to return to clinic to see Dr. Mosetta Putt and injection in January 2025.  Contacted pt regarding B12 injections.  LVM stating that Dr. Latanya Maudlin office was notified that pt has questions regarding B12 injections being administered at home.  Requested if pt or spouse would please give Dr. Latanya Maudlin office a call.  Awaiting the return call.

## 2023-06-09 ENCOUNTER — Ambulatory Visit: Payer: No Typology Code available for payment source | Admitting: Family Medicine

## 2023-06-09 ENCOUNTER — Ambulatory Visit (HOSPITAL_COMMUNITY): Payer: No Typology Code available for payment source | Attending: Adult Health

## 2023-06-09 ENCOUNTER — Ambulatory Visit: Payer: No Typology Code available for payment source | Admitting: Orthopedic Surgery

## 2023-06-16 ENCOUNTER — Encounter (HOSPITAL_COMMUNITY): Payer: Self-pay | Admitting: Adult Health

## 2023-06-18 ENCOUNTER — Telehealth: Payer: Self-pay | Admitting: Hematology

## 2023-07-10 ENCOUNTER — Ambulatory Visit (INDEPENDENT_AMBULATORY_CARE_PROVIDER_SITE_OTHER): Payer: No Typology Code available for payment source | Admitting: Orthopedic Surgery

## 2023-07-10 DIAGNOSIS — L97421 Non-pressure chronic ulcer of left heel and midfoot limited to breakdown of skin: Secondary | ICD-10-CM | POA: Diagnosis not present

## 2023-07-11 ENCOUNTER — Encounter: Payer: Self-pay | Admitting: Orthopedic Surgery

## 2023-07-11 NOTE — Progress Notes (Signed)
 Office Visit Note   Patient: Adam Ray           Date of Birth: 1955/08/04           MRN: [id number] Visit Date: 07/10/2023              Requested by: Donah Laymon PARAS, MD [address] [place],  [place] [address] PCP: Donah Laymon PARAS, MD  Chief Complaint  Patient presents with   Left Foot - Wound Check    Drainage/Odor- 12/23/22 left TMA      HPI: Patient is a 68 year old gentleman who presents with a new ulcer left heel.  He is status post a left transmetatarsal amputation.  Patient complains of odor and drainage from the wound for 2 weeks.  Patient denies any specific injury.  Assessment & Plan: Visit Diagnoses:  1. 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  He is to wash the foot with soap and water and apply dry dressing.  Follow-Up Instructions: Return in about 4 weeks (around 08/07/2023).   Ortho Exam  Patient is alert, oriented, no adenopathy, well-dressed, normal affect, normal respiratory effort. Examination patient has increased venous swelling in the left leg there is pitting edema up to the tibial tubercle.  Patient sits with his legs dependent.  Patient has a new superficial pressure ulcer over the left heel that is 2 cm in diameter with clear drainage there is no depth to the wound no exposed bone or tendon.  There is also an ulcer over the transmetatarsal amputation that is 2 cm in diameter with flat healthy granulation tissue no exposed bone or tendon no drainage.  Imaging: No results found. No images are attached to the encounter.  Labs: Lab Results  Component Value Date   HGBA1C 10.0 (A) 04/28/2023   HGBA1C 10.3 (A) 02/06/2023   HGBA1C 6.6 11/18/2022   ESRSEDRATE 54 (H) 01/01/2020   ESRSEDRATE 8 01/29/2016   ESRSEDRATE 9 04/11/2012   CRP 2.6 (H) 01/29/2016   CRP 0.5 02/28/2014   REPTSTATUS 01/18/2020 FINAL  01/17/2020   REPTSTATUS 01/22/2020 FINAL 01/17/2020   REPTSTATUS 01/22/2020 FINAL 01/17/2020   GRAMSTAIN  04/11/2012    NO WBC SEEN RARE SQUAMOUS EPITHELIAL CELLS PRESENT MODERATE GRAM POSITIVE COCCI IN PAIRS RARE GRAM NEGATIVE RODS   GRAMSTAIN  04/11/2012    NO WBC SEEN RARE SQUAMOUS EPITHELIAL CELLS PRESENT MODERATE GRAM POSITIVE COCCI IN PAIRS RARE GRAM NEGATIVE RODS   CULT (A) 01/17/2020    <10,000 COLONIES/mL INSIGNIFICANT GROWTH Performed at [facility], [address], [place], [place] [address]    CULT  01/17/2020    NO GROWTH 5 DAYS Performed at [facility], [address], [place], [place] [address]    CULT  01/17/2020    NO GROWTH 5 DAYS Performed at [facility], [address], [place], [place] [address]    LABORGA NO GROWTH 2 DAYS 07/19/2014     Lab Results  Component Value Date   ALBUMIN 4.1 12/16/2022   ALBUMIN 4.5 01/17/2022   ALBUMIN 4.1 12/06/2020    Lab Results  Component Value Date   MG 1.5 (L) 12/27/2022   No results found for: VD25OH  No results found for: PREALBUMIN    Latest Ref Rng & Units 03/13/2023    5:13 PM 02/01/2023    9:21 AM  12/27/2022   11:42 PM  CBC EXTENDED  WBC 3.4 - 10.8 x10E3/uL 3.3  3.7    RBC 4.14 - 5.80 x10E6/uL 4.12  4.27    4.25    Hemoglobin 13.0 - 17.7 g/dL 88.9  88.2  8.8   HCT 62.4 - 51.0 % 34.2  35.0  26.0   Platelets 150 - 450 x10E3/uL 134  123    NEUT# 1.7 - 7.7 K/uL  1.7    Lymph# 0.7 - 4.0 K/uL  1.4       There is no height or weight on file to calculate BMI.  Orders:  No orders of the defined types were placed in this encounter.  No orders of the defined types were placed in this encounter.    Procedures: No procedures performed  Clinical Data: No additional findings.  ROS:  All other systems negative, except as noted in the HPI. Review of Systems  Objective: Vital Signs: There were no vitals taken for this visit.  Specialty Comments:  No specialty comments  available.  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   Atrophy of tongue papillae 10/02/2022   Atherosclerosis of coronary artery without angina pectoris 10/02/2022   Age-related nuclear cataract of left eye 10/02/2022   Brown hairy tongue 10/02/2022   Proliferative diabetic retinopathy of right eye associated with type 2 diabetes mellitus (HCC) 10/02/2022   Tongue discoloration 02/14/2022   Hematuria 08/16/2021   Cervical spondylosis without myelopathy 12/14/2020   Pain in joint of left shoulder 12/14/2020   Urinary incontinence 08/10/2020   Increased urinary frequency 05/09/2020   Nocturnal leg cramps 10/28/2019   Hyperlipidemia associated with type 2 diabetes mellitus (HCC) 05/26/2019   Limited mobility 05/12/2018   Anxiety state 06/02/2017   Leg swelling 01/29/2017   Idiopathic chronic venous hypertension of both lower extremities with inflammation 09/24/2016   Diabetic polyneuropathy associated with type 2 diabetes mellitus (HCC)    Type 2 diabetes mellitus with neurologic complication, with long-term current use of insulin  (HCC) 12/08/2015   Left shoulder pain 08/10/2015   Pulmonary nodule 02/01/2015   Depression 12/09/2013   Warts, genital 08/20/2013   Diabetic retinopathy (HCC) 01/28/2013   Sleep apnea 09/06/2010   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   Hypertension associated with diabetes (HCC) 11/09/2008   COPD, mild (HCC) 10/06/2006   SICKLE-CELL  TRAIT 04/26/2006   ERECTILE DYSFUNCTION 04/26/2006   Tobacco use 04/26/2006   Peripheral arterial disease (HCC) 04/26/2006   Allergic rhinitis 04/26/2006   Past Medical History:  Diagnosis Date   Allergy    Arthritis    Bronchitis    Cataract    CHF (congestive heart failure) (HCC)    Chronic kidney disease    Claudication (HCC)    right foot ray resection   Colon polyps    hyperplastic   COPD (chronic obstructive pulmonary disease) (HCC)    Coronary artery disease    Diabetes mellitus    type II   Genital warts  Gout    Hyperlipidemia    Hypertension    Myocardial infarction [facility])    Osteomyelitis of third toe of right foot (HCC)    Pneumonia    Status post amputation of toe of right foot (HCC) 09/24/2016   Status post transmetatarsal amputation of foot, 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/2018   STEMI involving left circumflex coronary artery (HCC) 07/12/2018   Coronary artery disease   Subacute osteomyelitis, right ankle and foot (HCC) 01/29/2016   Testicular mass 04/18/2016    Family History  Problem Relation Age of Onset   Diabetes Mother    Stroke Mother    Heart failure Father    Heart attack Father    Diabetes Sister        multiple siblings   Diabetes Brother        muliple siblings   Heart disease Brother    Colon cancer Neg Hx    Esophageal cancer Neg Hx    Rectal cancer Neg Hx    Stomach cancer Neg Hx     Past Surgical History:  Procedure Laterality Date   AMPUTATION Right 01/31/2016   Procedure: Right 2nd Toe Amputation;  Surgeon: Jerona LULLA Sage, MD;  Location: [facility] OR;  Service: Orthopedics;  Laterality: Right;   AMPUTATION Right 07/08/2018   Procedure: RIGHT TRANSMETATARSAL AMPUTATION;  Surgeon: Sage Jerona LULLA, MD;  Location: [facility] OR;  Service: Orthopedics;  Laterality: Right;   AMPUTATION Right 01/05/2020   Procedure: RIGHT BELOW KNEE AMPUTATION;  Surgeon: Sage Jerona LULLA, MD;  Location: [facility] OR;  Service: Orthopedics;  Laterality: Right;   AMPUTATION Right 02/23/2020    Procedure: RIGHT BELOW KNEE AMPUTATION REVISION;  Surgeon: Sage Jerona LULLA, MD;  Location: [facility] OR;  Service: Orthopedics;  Laterality: Right;   AMPUTATION Left 10/11/2022   Procedure: LEFT GREAT TOE AMPUTATION AND LEFT 2ND TOE AMPUTATION;  Surgeon: Sage Jerona LULLA, MD;  Location: [facility] OR;  Service: Orthopedics;  Laterality: Left;   AMPUTATION Left 12/25/2022   Procedure: LEFT TRANSMETATARSAL AMPUTATION;  Surgeon: Sage Jerona LULLA, MD;  Location: [facility] OR;  Service: Orthopedics;  Laterality: Left;   CATARACT EXTRACTION     right eye   COLONOSCOPY     CORONARY STENT INTERVENTION N/A 07/12/2018   Procedure: CORONARY STENT INTERVENTION;  Surgeon: Burnard Debby LABOR, MD;  [facility];  Service: Cardiovascular;  Laterality: N/A;   CORONARY/GRAFT ACUTE MI REVASCULARIZATION N/A 07/12/2018   Procedure: Coronary/Graft Acute MI Revascularization;  Surgeon: Burnard Debby LABOR, MD;  [facility]: [facility] LAB;  Service: Cardiovascular;  Laterality: N/A;   I & D EXTREMITY  04/11/2012   Procedure: IRRIGATION AND DEBRIDEMENT EXTREMITY;  Surgeon: Norleen Armor, MD;  Location: [facility] OR;  Service: Orthopedics;  Laterality: Right;   LEFT HEART CATH AND CORONARY ANGIOGRAPHY N/A 07/12/2018   Procedure: LEFT HEART CATH AND CORONARY ANGIOGRAPHY;  Surgeon: Burnard Debby LABOR, MD;  [facility];  Service: Cardiovascular;  Laterality: N/A;   Surgery left great toe     Tear ducts bilateral eyes     TRANSMETATARSAL AMPUTATION Right 07/08/2018   Social History   Occupational History   Occupation: disabled  Tobacco Use   Smoking status: Former    Current packs/day: 0.00    Average packs/day: 0.3 packs/day for 55.9 years (16.8 ttl pk-yrs)    Types: Cigars, Cigarettes    Start date: 07/02/1963    Quit date: 06/01/2019    Years since quitting: 4.1    Passive exposure: Past  Smokeless tobacco: Former  Building Services Engineer status: Former  Substance and Sexual Activity   Alcohol use: Not Currently   Drug use: No    Sexual activity: Not on file

## 2023-07-14 ENCOUNTER — Other Ambulatory Visit: Payer: Self-pay | Admitting: Family Medicine

## 2023-07-29 ENCOUNTER — Other Ambulatory Visit: Payer: Self-pay | Admitting: Hematology

## 2023-07-29 ENCOUNTER — Telehealth: Payer: Self-pay

## 2023-07-29 ENCOUNTER — Other Ambulatory Visit: Payer: Self-pay

## 2023-07-29 NOTE — Telephone Encounter (Signed)
SW pt's wife, Marylene Land, advised that we need to see the foot in office to determine if there is any infection and to Rx the proper abx, if needed. They are scheduled tomorrow 1/29 at 2:45

## 2023-07-29 NOTE — Telephone Encounter (Signed)
[phone] Patient calling asking for pain medication and an antibiotic. I told him he might have to be seen for these. I told him you would call him back and let him know

## 2023-07-30 ENCOUNTER — Other Ambulatory Visit (INDEPENDENT_AMBULATORY_CARE_PROVIDER_SITE_OTHER): Payer: 59

## 2023-07-30 ENCOUNTER — Encounter: Payer: Self-pay | Admitting: Family

## 2023-07-30 ENCOUNTER — Other Ambulatory Visit: Payer: Self-pay

## 2023-07-30 ENCOUNTER — Ambulatory Visit (INDEPENDENT_AMBULATORY_CARE_PROVIDER_SITE_OTHER): Payer: 59 | Admitting: Family

## 2023-07-30 DIAGNOSIS — I890 Lymphedema, not elsewhere classified: Secondary | ICD-10-CM

## 2023-07-30 DIAGNOSIS — T8781 Dehiscence of amputation stump: Secondary | ICD-10-CM

## 2023-07-30 DIAGNOSIS — I872 Venous insufficiency (chronic) (peripheral): Secondary | ICD-10-CM | POA: Diagnosis not present

## 2023-07-30 DIAGNOSIS — L97421 Non-pressure chronic ulcer of left heel and midfoot limited to breakdown of skin: Secondary | ICD-10-CM | POA: Diagnosis not present

## 2023-07-30 DIAGNOSIS — M86272 Subacute osteomyelitis, left ankle and foot: Secondary | ICD-10-CM

## 2023-07-30 DIAGNOSIS — D519 Vitamin B12 deficiency anemia, unspecified: Secondary | ICD-10-CM

## 2023-07-30 DIAGNOSIS — D61818 Other pancytopenia: Secondary | ICD-10-CM

## 2023-07-30 MED ORDER — DOXYCYCLINE HYCLATE 100 MG PO TABS
100.0000 mg | ORAL_TABLET | Freq: Two times a day (BID) | ORAL | 0 refills | Status: DC
Start: 2023-07-30 — End: 2023-08-05

## 2023-07-30 NOTE — Progress Notes (Signed)
Office Visit Note   Patient: Adam Ray           Date of Birth: 02/01/1956           MRN: [id number] Visit Date: 07/30/2023              Requested by: Latrelle Dodrill, MD [address] [place],  [place] [address] PCP: Latrelle Dodrill, MD  Chief Complaint  Patient presents with   Left Foot - Wound Check      HPI: The patient is a 68 year old gentleman who is seen for evaluation of chronic ulcer to his left forefoot he is status post transmetatarsal amputation left foot many years ago wife reports he has had trouble with an ulcer to the forefoot for many years she is concerned that it is now worsening she reports a foul odor.  He has also had increased pain difficulty bearing weight on the left due to pain in the forefoot  Assessment & Plan: Visit Diagnoses:  1. Dehiscence of amputation stump of left lower extremity (HCC)   2. 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 Lymphedema due to venous insufficiency     Plan: Discussed radiographs with patient and family.  They understand the risk and benefit and would like to proceed with below-knee amputation on the left.  Will place him on a course of doxycycline in the interim.  Follow-Up Instructions: No follow-ups on file.   Ortho Exam  Patient is alert, oriented, no adenopathy, well-dressed, normal affect, normal respiratory effort.  Chronic lymphedema to the left lower extremity without weeping or ulceration. On examination left foot the patient has significant pitting edema of to the thigh the ulcer over the medial transmetatarsal amputation is now 1/2 cm in diameter filled in with 90% eschar this does probe to bone over 1st metatarsal. There is scant serous drainage no surrounding erythema The ulcer over his posterior heel has fully healed.  Imaging: No results found. No images are attached to the encounter.  Labs: Lab Results  Component Value Date    HGBA1C 10.0 (A) 04/28/2023   HGBA1C 10.3 (A) 02/06/2023   HGBA1C 6.6 11/18/2022   ESRSEDRATE 54 (H) 01/01/2020   ESRSEDRATE 8 01/29/2016   ESRSEDRATE 9 04/11/2012   CRP 2.6 (H) 01/29/2016   CRP 0.5 02/28/2014   REPTSTATUS 01/18/2020 FINAL 01/17/2020   REPTSTATUS 01/22/2020 FINAL 01/17/2020   REPTSTATUS 01/22/2020 FINAL 01/17/2020   GRAMSTAIN  04/11/2012    NO WBC SEEN RARE SQUAMOUS EPITHELIAL CELLS PRESENT MODERATE GRAM POSITIVE COCCI IN PAIRS RARE GRAM NEGATIVE RODS   GRAMSTAIN  04/11/2012    NO WBC SEEN RARE SQUAMOUS EPITHELIAL CELLS PRESENT MODERATE GRAM POSITIVE COCCI IN PAIRS RARE GRAM NEGATIVE RODS   CULT (A) 01/17/2020    <10,000 COLONIES/mL INSIGNIFICANT GROWTH Performed at [facility], [address], [place], [place] [address]    CULT  01/17/2020    NO GROWTH 5 DAYS Performed at [facility], [address] [place], [place] [address]    CULT  01/17/2020    NO GROWTH 5 DAYS Performed at [facility], [address] [place], [place] [address]    LABORGA NO GROWTH 2 DAYS 07/19/2014     Lab Results  Component Value Date   ALBUMIN 4.1 12/16/2022   ALBUMIN 4.5 01/17/2022   ALBUMIN 4.1 12/06/2020    Lab Results  Component Value Date  MG 1.5 (L) 12/27/2022   No results found for: "VD25OH"  No results found for: "PREALBUMIN"    Latest Ref Rng & Units 03/13/2023    5:13 PM 02/01/2023    9:21 AM 12/27/2022   11:42 PM  CBC EXTENDED  WBC 3.4 - 10.8 x10E3/uL 3.3  3.7    RBC 4.14 - 5.80 x10E6/uL 4.12  4.27    4.25    Hemoglobin 13.0 - 17.7 g/dL 16.1  09.6  8.8   HCT 04.5 - 51.0 % 34.2  35.0  26.0   Platelets 150 - 450 x10E3/uL 134  123    NEUT# 1.7 - 7.7 K/uL  1.7    Lymph# 0.7 - 4.0 K/uL  1.4       There is no height or weight on file to calculate BMI.  Orders:  Orders Placed This Encounter  Procedures   XR Foot Complete Left   No orders of the defined types were placed in this encounter.    Procedures: No procedures  performed  Clinical Data: No additional findings.  ROS:  All other systems negative, except as noted in the HPI. Review of Systems  Objective: Vital Signs: There were no vitals taken for this visit.  Specialty Comments:  No specialty comments available.  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   Atrophy of tongue papillae 10/02/2022   Atherosclerosis of coronary artery without angina pectoris 10/02/2022   Age-related nuclear cataract of left eye 10/02/2022   Brown hairy tongue 10/02/2022   Proliferative diabetic retinopathy of right eye associated with type 2 diabetes mellitus (HCC) 10/02/2022   Tongue discoloration 02/14/2022   Hematuria 08/16/2021   Cervical spondylosis without myelopathy 12/14/2020   Pain in joint of left shoulder 12/14/2020   Urinary incontinence 08/10/2020   Increased urinary frequency 05/09/2020   Nocturnal leg cramps 10/28/2019   Hyperlipidemia associated with type 2 diabetes mellitus (HCC) 05/26/2019   Limited mobility 05/12/2018   Anxiety state 06/02/2017   Leg swelling 01/29/2017   Idiopathic chronic venous hypertension of both lower extremities with inflammation 09/24/2016   Diabetic polyneuropathy associated with type 2 diabetes mellitus (HCC)    Type 2 diabetes mellitus with neurologic complication, with long-term current use of insulin (HCC) 12/08/2015   Left shoulder pain 08/10/2015   Pulmonary nodule 02/01/2015    Depression 12/09/2013   Warts, genital 08/20/2013   Diabetic retinopathy (HCC) 01/28/2013   Sleep apnea 09/06/2010   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   Hypertension associated with diabetes (HCC) 11/09/2008   COPD, mild (HCC) 10/06/2006   SICKLE-CELL TRAIT 04/26/2006   ERECTILE DYSFUNCTION 04/26/2006   Tobacco use 04/26/2006   Peripheral arterial disease (HCC) 04/26/2006   Allergic rhinitis 04/26/2006   Past Medical History:  Diagnosis Date   Allergy    Arthritis    Bronchitis    Cataract    CHF (congestive heart failure) (HCC)    Chronic kidney disease    Claudication (HCC)    right foot ray resection  Colon polyps    hyperplastic   COPD (chronic obstructive pulmonary disease) (HCC)    Coronary artery disease    Diabetes mellitus    type II   Genital warts    Gout    Hyperlipidemia    Hypertension    Myocardial infarction (HCC)    Osteomyelitis of third toe of right foot (HCC)    Pneumonia    Status post amputation of toe of right foot (HCC) 09/24/2016   Status post transmetatarsal amputation of foot, 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/2018   STEMI involving left circumflex coronary artery (HCC) 07/12/2018   Coronary artery disease   Subacute osteomyelitis, right ankle and foot (HCC) 01/29/2016   Testicular mass 04/18/2016    Family History  Problem Relation Age of Onset   Diabetes Mother    Stroke Mother    Heart failure Father    Heart attack Father    Diabetes Sister        multiple siblings   Diabetes Brother        muliple siblings   Heart disease Brother    Colon cancer Neg Hx    Esophageal cancer Neg Hx    Rectal cancer Neg Hx    Stomach cancer Neg Hx     Past Surgical History:  Procedure Laterality Date   AMPUTATION Right 01/31/2016   Procedure: Right 2nd Toe Amputation;  Surgeon: [name], MD;  Location: [facility] OR;  Service: Orthopedics;  Laterality: Right;   AMPUTATION Right 07/08/2018   Procedure: RIGHT TRANSMETATARSAL AMPUTATION;  Surgeon: [name], MD;   Location: [facility] OR;  Service: Orthopedics;  Laterality: Right;   AMPUTATION Right 01/05/2020   Procedure: RIGHT BELOW KNEE AMPUTATION;  Surgeon: [name], MD;  Location: [facility] OR;  Service: Orthopedics;  Laterality: Right;   AMPUTATION Right 02/23/2020   Procedure: RIGHT BELOW KNEE AMPUTATION REVISION;  Surgeon: [name], MD;  Location: [facility] OR;  Service: Orthopedics;  Laterality: Right;   AMPUTATION Left 10/11/2022   Procedure: LEFT GREAT TOE AMPUTATION AND LEFT 2ND TOE AMPUTATION;  Surgeon: [name], MD;  Location: [facility] OR;  Service: Orthopedics;  Laterality: Left;   AMPUTATION Left 12/25/2022   Procedure: LEFT TRANSMETATARSAL AMPUTATION;  Surgeon: [name], MD;  Location: [facility] OR;  Service: Orthopedics;  Laterality: Left;   CATARACT EXTRACTION     right eye   COLONOSCOPY     CORONARY STENT INTERVENTION N/A 07/12/2018   Procedure: CORONARY STENT INTERVENTION;  Surgeon: Lennette Bihari, MD;  [facility] LAB;  Service: Cardiovascular;  Laterality: N/A;   CORONARY/GRAFT ACUTE MI REVASCULARIZATION N/A 07/12/2018   Procedure: Coronary/Graft Acute MI Revascularization;  Surgeon: Lennette Bihari, MD;  Location: [facility];  Service: Cardiovascular;  Laterality: N/A;   I & D EXTREMITY  04/11/2012   Procedure: IRRIGATION AND DEBRIDEMENT EXTREMITY;  Surgeon: Toni Arthurs, MD;  Location: [facility] OR;  Service: Orthopedics;  Laterality: Right;   LEFT HEART CATH AND CORONARY ANGIOGRAPHY N/A 07/12/2018   Procedure: LEFT HEART CATH AND CORONARY ANGIOGRAPHY;  Surgeon: Lennette Bihari, MD;  Location: [facility];  Service: Cardiovascular;  Laterality: N/A;   Surgery left great toe     Tear ducts bilateral eyes     TRANSMETATARSAL AMPUTATION Right 07/08/2018   Social History   Occupational History   Occupation: disabled  Tobacco Use   Smoking status: Former    Current packs/day: 0.00    Average packs/day: 0.3 packs/day for 55.9  years (16.8 ttl pk-yrs)    Types: Cigars,  Cigarettes    Start date: 07/02/1963    Quit date: 06/01/2019    Years since quitting: 4.1    Passive exposure: Past   Smokeless tobacco: Former  Building services engineer status: Former  Substance and Sexual Activity   Alcohol use: Not Currently   Drug use: No   Sexual activity: Not on file

## 2023-07-31 ENCOUNTER — Inpatient Hospital Stay: Payer: No Typology Code available for payment source

## 2023-07-31 ENCOUNTER — Other Ambulatory Visit: Payer: Self-pay

## 2023-07-31 ENCOUNTER — Inpatient Hospital Stay: Payer: No Typology Code available for payment source | Attending: Family Medicine

## 2023-07-31 ENCOUNTER — Inpatient Hospital Stay: Payer: No Typology Code available for payment source | Admitting: Hematology

## 2023-07-31 ENCOUNTER — Encounter (HOSPITAL_COMMUNITY): Payer: Self-pay | Admitting: Orthopedic Surgery

## 2023-07-31 ENCOUNTER — Telehealth: Payer: Self-pay

## 2023-07-31 MED ORDER — TRANEXAMIC ACID 1000 MG/10ML IV SOLN
2000.0000 mg | INTRAVENOUS | Status: DC
  Filled 2023-07-31: qty 20

## 2023-07-31 NOTE — Assessment & Plan Note (Deleted)
 -  Stable, he is not symptomatic, probably benign. -His multiple myeloma panel were negative -Repeated PSA in August 2024 was undetectable -I did not think he needed a bone biopsy.

## 2023-07-31 NOTE — Progress Notes (Signed)
SDW CALL  Patient's wife and patient was given pre-op instructions over the phone. The opportunity was given for the patient to ask questions. No further questions asked. Patient verbalized understanding of instructions given.   PCP - Levert Feinstein Cardiologist - Nanetta Batty  PPM/ICD - denies Device Orders - n/a Rep Notified - n/a  Chest x-ray -  EKG - 12/27/22 Stress Test -  ECHO - 05/05/23 Cardiac Cath -   Sleep Study - denies CPAP - n/a  Fasting Blood Sugar - 369 Checks Blood Sugar __3___ times a day  Last dose of GLP1 agonist-  07/28/23 GLP1 instructions:  do not take Trulicity tonight or tomorrow  Patient no longer has a Freestyle Libre  Blood Thinner Instructions: n/a Aspirin Instructions: n/a  ERAS Protcol - clears until 0700 PRE-SURGERY Ensure or G2- n/a  COVID TEST-  n/a   Anesthesia review: yes  Patient denies shortness of breath, fever, cough and chest pain over the phone call   All instructions explained to the patient, with a verbal understanding of the material. Patient agrees to go over the instructions while at home for a better understanding.     All instructions were explained to the patient and patient's wife.  Medications to take were spelled out for wife as she wrote them down.  It was said multiple times that patient can not put milk, creamer or powder cream in his coffee.  Wife verbalized understanding.

## 2023-07-31 NOTE — Assessment & Plan Note (Deleted)
-  He had mild chronic anemia in the past, but got much worse in 2024, also developed a mild neutropenia and thrombocytopenia  -Anemia workup in early August 2024 showed normal iron study, folate level, B12 level was slightly low 221, normal immature platelet, episodes of reticulocyte count 88, his anemia improved hemoglobin 11.7, mild leukopenia and thrombocytopenia. -I recommend him to start oral B12 1000 mcg daily, and started B12 injection monthly in 04/2023.  Given the spontaneous improvement of hemoglobin, I will hold on bone marrow biopsy for now.

## 2023-07-31 NOTE — Anesthesia Preprocedure Evaluation (Addendum)
Anesthesia Evaluation  Patient identified by MRN, date of birth, ID band Patient awake    Reviewed: Allergy & Precautions, H&P , NPO status , Patient's Chart, lab work & pertinent test results, Unable to perform ROS - Chart review only  Airway Mallampati: II  TM Distance: >3 FB Neck ROM: Full    Dental no notable dental hx. (+) Teeth Intact, Dental Advisory Given   Pulmonary sleep apnea , pneumonia, COPD, former smoker   Pulmonary exam normal breath sounds clear to auscultation       Cardiovascular Exercise Tolerance: Good hypertension, + CAD, + Past MI, + Peripheral Vascular Disease and +CHF  Normal cardiovascular exam+ Valvular Problems/Murmurs AS  Rhythm:Regular Rate:Normal  TEE 24    1. Left ventricular ejection fraction, by estimation, is 70 to 75%. The left ventricle has hyperdynamic function. The left ventricle has no regional wall motion abnormalities. There is mild left ventricular hypertrophy.  2. Right ventricular systolic function is normal. The right ventricular size is normal. There is normal pulmonary artery systolic pressure.  3. Mild mitral valve regurgitation. Moderate mitral annular calcification.  4. AV is thickened, calcified with restricted motion. Peak and mean gradients through the valve are 32 and 20 mm HG respectively. AVA (VTI) is 1.52 cm2. Dimensionless index is 0.34. Overall consistent with moderate AS. This is new wen compared to report  from 2020. The aortic valve is tricuspid. Aortic valve regurgitation is not visualized.  5. The inferior vena cava is normal in size with greater than 50% respiratory variability, suggesting right atrial pressure of 3 mmHg.      Neuro/Psych  PSYCHIATRIC DISORDERS Anxiety Depression     Neuromuscular disease    GI/Hepatic negative GI ROS, Neg liver ROS,,,  Endo/Other  diabetes, Type 2    Renal/GU Renal disease  negative genitourinary   Musculoskeletal  (+)  Arthritis ,    Abdominal   Peds  Hematology  (+) Blood dyscrasia, anemia   Anesthesia Other Findings All: codeine  Reproductive/Obstetrics negative OB ROS                             Anesthesia Physical Anesthesia Plan  ASA: 4  Anesthesia Plan: General and Regional   Post-op Pain Management: Regional block*   Induction: Cricoid pressure planned, Rapid sequence and Intravenous  PONV Risk Score and Plan: 3 and Midazolam, Propofol infusion and Treatment may vary due to age or medical condition  Airway Management Planned: LMA  Additional Equipment: None  Intra-op Plan:   Post-operative Plan: Extubation in OR  Informed Consent: I have reviewed the patients History and Physical, chart, labs and discussed the procedure including the risks, benefits and alternatives for the proposed anesthesia with the patient or authorized representative who has indicated his/her understanding and acceptance.     Dental advisory given  Plan Discussed with:   Anesthesia Plan Comments: (PAT note by Antionette Poles, PA-C:  68 year old male follows with cardiology for history of PAD, HTN, moderate aortic stenosis, CAD s/p inferior STEMI treated with DES to mid left circumflex 07/2018.  Echo 05/2023 showed EF 70 to 75% with hyperdynamic function, mild left ventricular hypertrophy, mild mitral valve regurgitation, moderate aortic stenosis with mean gradient 20 mmHg and AVA 1.52 cm2.  Last seen in follow-up by Joni Reining, NP on 05/09/2023.  Per note, asymptomatic for chest pain or shortness of breath, however he is noted to be very sedentary at baseline.  Recommended repeat echo in  6 months.  Follows with orthopedics for history of left TMA, now with dehiscence of amputation stump.  Last seen on 07/30/2023 and left BKA was recommended due to worsening ulcer over TMA amputation.  Follows with hematology for history of pancytopenia.  He is currently receiving B12  injections.  Former smoker (16.8 pack years, quit 2020) with associated COPD, not on any daily inhaled medications.  Poorly controlled IDDM 2, last A1c 10.0 07/10/2022.  Patient will need day of surgery labs and evaluation.  EKG 12/27/2022: Sinus rhythm.  Rate 75.  Short PR interval.  Anterior infarct, old.  TTE 05/05/2023: 1. Left ventricular ejection fraction, by estimation, is 70 to 75%. The  left ventricle has hyperdynamic function. The left ventricle has no  regional wall motion abnormalities. There is mild left ventricular  hypertrophy.  2. Right ventricular systolic function is normal. The right ventricular  size is normal. There is normal pulmonary artery systolic pressure.  3. Mild mitral valve regurgitation. Moderate mitral annular  calcification.  4. AV is thickened, calcified with restricted motion. Peak and mean  gradients through the valve are 32 and 20 mm HG respectively. AVA (VTI) is  1.52 cm2. Dimensionless index is 0.34. Overall consistent with moderate  AS. This is new wen compared to report  from 2020. The aortic valve is tricuspid. Aortic valve regurgitation is  not visualized.  5. The inferior vena cava is normal in size with greater than 50%  respiratory variability, suggesting right atrial pressure of 3 mmHg.   Cath/PCI 07/12/2018:  Prox Cx to Mid Cx lesion is 95% stenosed.  Post intervention, there is a 0% residual stenosis.  A stent was successfully placed.   Acute inferior ST segment elevation myocardial infarction secondary to subtotal occlusion of a codominant mid left circumflex coronary artery.  Normal LAD and RCA.  LVEDP 20 mmHg.  Successful PCI to the mid left circumflex coronary artery with ultimate insertion of a 2.25 x 24 mm Synergy DES stent postdilated to 2.4 mm with the stenosis being reduced to 0%.    )       Anesthesia Quick Evaluation

## 2023-07-31 NOTE — Telephone Encounter (Signed)
Spoke with pt and spouse regarding appts scheduled for today.  Pt's wife stated she was going to call to cancel the appts but did not know the number.  Pt's wife stated the pt is scheduled for surgery on 08/01/2023 to have leg amputated.  Instructed pt's wife to give our office a call when the pt is d/c home and cleared by the surgeon or d/c from rehab.  Pt's wife verbalized understanding of instructions.

## 2023-07-31 NOTE — Progress Notes (Signed)
Anesthesia Chart Review: Same-day workup  68 year old male follows with cardiology for history of PAD, HTN, moderate aortic stenosis, CAD s/p inferior STEMI treated with DES to mid left circumflex 07/2018.  Echo 05/2023 showed EF 70 to 75% with hyperdynamic function, mild left ventricular hypertrophy, mild mitral valve regurgitation, moderate aortic stenosis with mean gradient 20 mmHg and AVA 1.52 cm2.  Last seen in follow-up by Joni Reining, NP on 05/09/2023.  Per note, asymptomatic for chest pain or shortness of breath, however he is noted to be very sedentary at baseline.  Recommended repeat echo in 6 months.  Follows with orthopedics for history of left TMA, now with dehiscence of amputation stump.  Last seen on 07/30/2023 and left BKA was recommended due to worsening ulcer over TMA amputation.  Follows with hematology for history of pancytopenia.  He is currently receiving B12 injections.  Former smoker (16.8 pack years, quit 2020) with associated COPD, not on any daily inhaled medications.  Poorly controlled IDDM 2, last A1c 10.0 07/10/2022.  Patient will need day of surgery labs and evaluation.  EKG 12/27/2022: Sinus rhythm.  Rate 75.  Short PR interval.  Anterior infarct, old.  TTE 05/05/2023: 1. Left ventricular ejection fraction, by estimation, is 70 to 75%. The  left ventricle has hyperdynamic function. The left ventricle has no  regional wall motion abnormalities. There is mild left ventricular  hypertrophy.   2. Right ventricular systolic function is normal. The right ventricular  size is normal. There is normal pulmonary artery systolic pressure.   3. Mild mitral valve regurgitation. Moderate mitral annular  calcification.   4. AV is thickened, calcified with restricted motion. Peak and mean  gradients through the valve are 32 and 20 mm HG respectively. AVA (VTI) is  1.52 cm2. Dimensionless index is 0.34. Overall consistent with moderate  AS. This is new wen compared to  report   from 2020. The aortic valve is tricuspid. Aortic valve regurgitation is  not visualized.   5. The inferior vena cava is normal in size with greater than 50%  respiratory variability, suggesting right atrial pressure of 3 mmHg.   Cath/PCI 07/12/2018: Prox Cx to Mid Cx lesion is 95% stenosed. Post intervention, there is a 0% residual stenosis. A stent was successfully placed.   Acute inferior ST segment elevation myocardial infarction secondary to subtotal occlusion of a codominant mid left circumflex coronary artery.   Normal LAD and RCA.   LVEDP 20 mmHg.   Successful PCI to the mid left circumflex coronary artery with ultimate insertion of a 2.25 x 24 mm Synergy DES stent postdilated to 2.4 mm with the stenosis being reduced to 0%.   RECOMMENDATION: DAPT therapy for a minimum of 12 months of aspirin/Brilinta.  Optimal blood pressure control.  Aggressive lipid intervention with target LDL less than 70.    Zannie Cove [facility]/Anesthesiology Phone [phone] 07/31/2023 12:08 PM

## 2023-08-01 ENCOUNTER — Encounter (HOSPITAL_COMMUNITY)
Admission: RE | Disposition: A | Discharge status: Rehabilitation Facility | Payer: Self-pay | Source: Home / Self Care | Attending: Orthopedic Surgery

## 2023-08-01 ENCOUNTER — Inpatient Hospital Stay (HOSPITAL_COMMUNITY)
Admission: RE | Admit: 2023-08-01 | Discharge: 2023-08-05 | DRG: 464 | Disposition: A | Discharge status: Rehabilitation Facility | Payer: 59 | Attending: Orthopedic Surgery | Admitting: Orthopedic Surgery

## 2023-08-01 ENCOUNTER — Inpatient Hospital Stay (HOSPITAL_COMMUNITY): Payer: 59 | Admitting: Physician Assistant

## 2023-08-01 ENCOUNTER — Encounter (HOSPITAL_COMMUNITY): Payer: Self-pay | Admitting: Orthopedic Surgery

## 2023-08-01 ENCOUNTER — Other Ambulatory Visit: Payer: Self-pay

## 2023-08-01 DIAGNOSIS — K5900 Constipation, unspecified: Secondary | ICD-10-CM | POA: Diagnosis present

## 2023-08-01 DIAGNOSIS — Z91040 Latex allergy status: Secondary | ICD-10-CM

## 2023-08-01 DIAGNOSIS — M869 Osteomyelitis, unspecified: Secondary | ICD-10-CM | POA: Diagnosis present

## 2023-08-01 DIAGNOSIS — E785 Hyperlipidemia, unspecified: Secondary | ICD-10-CM | POA: Diagnosis present

## 2023-08-01 DIAGNOSIS — E1165 Type 2 diabetes mellitus with hyperglycemia: Secondary | ICD-10-CM | POA: Diagnosis not present

## 2023-08-01 DIAGNOSIS — K5901 Slow transit constipation: Secondary | ICD-10-CM | POA: Diagnosis not present

## 2023-08-01 DIAGNOSIS — M13162 Monoarthritis, not elsewhere classified, left knee: Secondary | ICD-10-CM | POA: Diagnosis not present

## 2023-08-01 DIAGNOSIS — Z89511 Acquired absence of right leg below knee: Secondary | ICD-10-CM | POA: Diagnosis not present

## 2023-08-01 DIAGNOSIS — T8781 Dehiscence of amputation stump: Secondary | ICD-10-CM | POA: Diagnosis not present

## 2023-08-01 DIAGNOSIS — I2510 Atherosclerotic heart disease of native coronary artery without angina pectoris: Secondary | ICD-10-CM

## 2023-08-01 DIAGNOSIS — Z7984 Long term (current) use of oral hypoglycemic drugs: Secondary | ICD-10-CM

## 2023-08-01 DIAGNOSIS — Z87891 Personal history of nicotine dependence: Secondary | ICD-10-CM

## 2023-08-01 DIAGNOSIS — I872 Venous insufficiency (chronic) (peripheral): Secondary | ICD-10-CM | POA: Diagnosis present

## 2023-08-01 DIAGNOSIS — Z955 Presence of coronary angioplasty implant and graft: Secondary | ICD-10-CM

## 2023-08-01 DIAGNOSIS — J449 Chronic obstructive pulmonary disease, unspecified: Secondary | ICD-10-CM

## 2023-08-01 DIAGNOSIS — I350 Nonrheumatic aortic (valve) stenosis: Secondary | ICD-10-CM | POA: Diagnosis not present

## 2023-08-01 DIAGNOSIS — N400 Enlarged prostate without lower urinary tract symptoms: Secondary | ICD-10-CM | POA: Diagnosis present

## 2023-08-01 DIAGNOSIS — Z01818 Encounter for other preprocedural examination: Principal | ICD-10-CM

## 2023-08-01 DIAGNOSIS — L97421 Non-pressure chronic ulcer of left heel and midfoot limited to breakdown of skin: Secondary | ICD-10-CM | POA: Diagnosis present

## 2023-08-01 DIAGNOSIS — I110 Hypertensive heart disease with heart failure: Secondary | ICD-10-CM | POA: Diagnosis not present

## 2023-08-01 DIAGNOSIS — Z9841 Cataract extraction status, right eye: Secondary | ICD-10-CM

## 2023-08-01 DIAGNOSIS — E1151 Type 2 diabetes mellitus with diabetic peripheral angiopathy without gangrene: Secondary | ICD-10-CM | POA: Diagnosis not present

## 2023-08-01 DIAGNOSIS — E11621 Type 2 diabetes mellitus with foot ulcer: Secondary | ICD-10-CM | POA: Diagnosis not present

## 2023-08-01 DIAGNOSIS — I739 Peripheral vascular disease, unspecified: Secondary | ICD-10-CM | POA: Diagnosis not present

## 2023-08-01 DIAGNOSIS — Z823 Family history of stroke: Secondary | ICD-10-CM

## 2023-08-01 DIAGNOSIS — I70202 Unspecified atherosclerosis of native arteries of extremities, left leg: Secondary | ICD-10-CM | POA: Diagnosis not present

## 2023-08-01 DIAGNOSIS — G8918 Other acute postprocedural pain: Secondary | ICD-10-CM | POA: Diagnosis not present

## 2023-08-01 DIAGNOSIS — M67472 Ganglion, left ankle and foot: Secondary | ICD-10-CM | POA: Diagnosis not present

## 2023-08-01 DIAGNOSIS — Z794 Long term (current) use of insulin: Secondary | ICD-10-CM

## 2023-08-01 DIAGNOSIS — Z885 Allergy status to narcotic agent status: Secondary | ICD-10-CM | POA: Diagnosis not present

## 2023-08-01 DIAGNOSIS — I890 Lymphedema, not elsewhere classified: Secondary | ICD-10-CM | POA: Diagnosis not present

## 2023-08-01 DIAGNOSIS — Z833 Family history of diabetes mellitus: Secondary | ICD-10-CM

## 2023-08-01 DIAGNOSIS — I10 Essential (primary) hypertension: Secondary | ICD-10-CM | POA: Diagnosis not present

## 2023-08-01 DIAGNOSIS — Z89512 Acquired absence of left leg below knee: Secondary | ICD-10-CM

## 2023-08-01 DIAGNOSIS — I509 Heart failure, unspecified: Secondary | ICD-10-CM | POA: Diagnosis not present

## 2023-08-01 DIAGNOSIS — I252 Old myocardial infarction: Secondary | ICD-10-CM

## 2023-08-01 DIAGNOSIS — Z79899 Other long term (current) drug therapy: Secondary | ICD-10-CM | POA: Diagnosis not present

## 2023-08-01 DIAGNOSIS — E1169 Type 2 diabetes mellitus with other specified complication: Secondary | ICD-10-CM | POA: Diagnosis not present

## 2023-08-01 DIAGNOSIS — I959 Hypotension, unspecified: Secondary | ICD-10-CM | POA: Diagnosis not present

## 2023-08-01 DIAGNOSIS — Z860102 Personal history of hyperplastic colon polyps: Secondary | ICD-10-CM

## 2023-08-01 DIAGNOSIS — Z8249 Family history of ischemic heart disease and other diseases of the circulatory system: Secondary | ICD-10-CM | POA: Diagnosis not present

## 2023-08-01 DIAGNOSIS — E118 Type 2 diabetes mellitus with unspecified complications: Secondary | ICD-10-CM | POA: Diagnosis not present

## 2023-08-01 DIAGNOSIS — Y835 Amputation of limb(s) as the cause of abnormal reaction of the patient, or of later complication, without mention of misadventure at the time of the procedure: Secondary | ICD-10-CM | POA: Diagnosis present

## 2023-08-01 DIAGNOSIS — Z7982 Long term (current) use of aspirin: Secondary | ICD-10-CM

## 2023-08-01 DIAGNOSIS — L859 Epidermal thickening, unspecified: Secondary | ICD-10-CM | POA: Diagnosis not present

## 2023-08-01 DIAGNOSIS — I340 Nonrheumatic mitral (valve) insufficiency: Secondary | ICD-10-CM | POA: Diagnosis present

## 2023-08-01 HISTORY — PX: AMPUTATION: SHX166

## 2023-08-01 LAB — HEMOGLOBIN A1C
Hgb A1c MFr Bld: 10 % — ABNORMAL HIGH (ref 4.8–5.6)
Mean Plasma Glucose: 240.3 mg/dL

## 2023-08-01 LAB — COMPREHENSIVE METABOLIC PANEL
ALT: 18 U/L (ref 0–44)
AST: 24 U/L (ref 15–41)
Albumin: 3 g/dL — ABNORMAL LOW (ref 3.5–5.0)
Alkaline Phosphatase: 105 U/L (ref 38–126)
Anion gap: 11 (ref 5–15)
BUN: 16 mg/dL (ref 8–23)
CO2: 28 mmol/L (ref 22–32)
Calcium: 9 mg/dL (ref 8.9–10.3)
Chloride: 96 mmol/L — ABNORMAL LOW (ref 98–111)
Creatinine, Ser: 0.92 mg/dL (ref 0.61–1.24)
GFR, Estimated: >60 mL/min (ref >60)
Glucose, Bld: 316 mg/dL — ABNORMAL HIGH (ref 70–99)
Potassium: 3.9 mmol/L (ref 3.5–5.1)
Sodium: 135 mmol/L (ref 135–145)
Total Bilirubin: 0.6 mg/dL (ref 0.0–1.2)
Total Protein: 6.6 g/dL (ref 6.5–8.1)

## 2023-08-01 LAB — CBC WITH DIFFERENTIAL/PLATELET
Abs Immature Granulocytes: 0.02 10*3/uL (ref 0.00–0.07)
Basophils Absolute: 0 10*3/uL (ref 0.0–0.1)
Basophils Relative: 1 %
Eosinophils Absolute: 0.1 10*3/uL (ref 0.0–0.5)
Eosinophils Relative: 2 %
HCT: 29.5 % — ABNORMAL LOW (ref 39.0–52.0)
Hemoglobin: 9.8 g/dL — ABNORMAL LOW (ref 13.0–17.0)
Immature Granulocytes: 1 %
Lymphocytes Relative: 22 %
Lymphs Abs: 0.8 10*3/uL (ref 0.7–4.0)
MCH: 27.3 pg (ref 26.0–34.0)
MCHC: 33.2 g/dL (ref 30.0–36.0)
MCV: 82.2 fL (ref 80.0–100.0)
Monocytes Absolute: 0.3 10*3/uL (ref 0.1–1.0)
Monocytes Relative: 9 %
Neutro Abs: 2.3 10*3/uL (ref 1.7–7.7)
Neutrophils Relative %: 65 %
Platelets: 109 10*3/uL — ABNORMAL LOW (ref 150–400)
RBC: 3.59 MIL/uL — ABNORMAL LOW (ref 4.22–5.81)
RDW: 13.9 % (ref 11.5–15.5)
WBC: 3.5 10*3/uL — ABNORMAL LOW (ref 4.0–10.5)
nRBC: 0 % (ref 0.0–0.2)

## 2023-08-01 LAB — GLUCOSE, CAPILLARY
Glucose-Capillary: 288 mg/dL — ABNORMAL HIGH (ref 70–99)
Glucose-Capillary: 278 mg/dL — ABNORMAL HIGH (ref 70–99)
Glucose-Capillary: 300 mg/dL — ABNORMAL HIGH (ref 70–99)
Glucose-Capillary: 218 mg/dL — ABNORMAL HIGH (ref 70–99)

## 2023-08-01 LAB — PREALBUMIN: Prealbumin: 8 mg/dL — ABNORMAL LOW (ref 18–38)

## 2023-08-01 SURGERY — AMPUTATION BELOW KNEE
Anesthesia: Regional | Site: Knee | Laterality: Left

## 2023-08-01 MED ORDER — PANTOPRAZOLE SODIUM 40 MG PO TBEC
40.0000 mg | DELAYED_RELEASE_TABLET | Freq: Every day | ORAL | Status: DC
  Administered 2023-08-01 – 2023-08-05 (×5): 40 mg via ORAL
  Filled 2023-08-01 (×5): qty 1

## 2023-08-01 MED ORDER — METOPROLOL TARTRATE 25 MG PO TABS
25.0000 mg | ORAL_TABLET | Freq: Two times a day (BID) | ORAL | Status: DC
  Administered 2023-08-02 – 2023-08-05 (×7): 25 mg via ORAL
  Filled 2023-08-01 (×8): qty 1

## 2023-08-01 MED ORDER — JUVEN PO PACK
1.0000 | PACK | Freq: Two times a day (BID) | ORAL | Status: DC
  Administered 2023-08-02 – 2023-08-05 (×8): 1 via ORAL
  Filled 2023-08-01 (×8): qty 1

## 2023-08-01 MED ORDER — CEFAZOLIN SODIUM-DEXTROSE 2-4 GM/100ML-% IV SOLN
2.0000 g | Freq: Three times a day (TID) | INTRAVENOUS | Status: AC
  Administered 2023-08-01 – 2023-08-02 (×2): 2 g via INTRAVENOUS
  Filled 2023-08-01 (×2): qty 100

## 2023-08-01 MED ORDER — VITAMIN C 500 MG PO TABS
1000.0000 mg | ORAL_TABLET | Freq: Every day | ORAL | Status: DC
  Administered 2023-08-01 – 2023-08-05 (×5): 1000 mg via ORAL
  Filled 2023-08-01 (×6): qty 2

## 2023-08-01 MED ORDER — MIDAZOLAM HCL 2 MG/2ML IJ SOLN
INTRAMUSCULAR | Status: AC
  Administered 2023-08-01: 2 mg
  Filled 2023-08-01: qty 2

## 2023-08-01 MED ORDER — ONDANSETRON HCL 4 MG/2ML IJ SOLN: 4.0000 mg | Freq: Four times a day (QID) | INTRAMUSCULAR | Status: DC | PRN

## 2023-08-01 MED ORDER — POTASSIUM CHLORIDE CRYS ER 20 MEQ PO TBCR: 20.0000 meq | EXTENDED_RELEASE_TABLET | Freq: Every day | ORAL | Status: DC | PRN

## 2023-08-01 MED ORDER — FENTANYL CITRATE (PF) 100 MCG/2ML IJ SOLN: 25.0000 ug | INTRAMUSCULAR | Status: DC | PRN

## 2023-08-01 MED ORDER — POLYETHYLENE GLYCOL 3350 17 G PO PACK
17.0000 g | PACK | Freq: Every day | ORAL | Status: DC | PRN
Start: 2023-08-01 — End: 2023-08-05

## 2023-08-01 MED ORDER — INSULIN ASPART 100 UNIT/ML IJ SOLN
4.0000 [IU] | Freq: Three times a day (TID) | INTRAMUSCULAR | Status: DC
  Administered 2023-08-01 – 2023-08-05 (×8): 4 [IU] via SUBCUTANEOUS

## 2023-08-01 MED ORDER — OXYCODONE HCL 5 MG/5ML PO SOLN: 5.0000 mg | Freq: Once | ORAL | Status: DC | PRN

## 2023-08-01 MED ORDER — PHENYLEPHRINE 80 MCG/ML (10ML) SYRINGE FOR IV PUSH (FOR BLOOD PRESSURE SUPPORT)
PREFILLED_SYRINGE | INTRAVENOUS | Status: DC | PRN
  Administered 2023-08-01: 120 ug via INTRAVENOUS
  Administered 2023-08-01 (×2): 80 ug via INTRAVENOUS
  Administered 2023-08-01: 160 ug via INTRAVENOUS
  Administered 2023-08-01: 120 ug via INTRAVENOUS

## 2023-08-01 MED ORDER — BUPIVACAINE HCL (PF) 0.5 % IJ SOLN
INTRAMUSCULAR | Status: DC | PRN
  Administered 2023-08-01: 10 mL

## 2023-08-01 MED ORDER — ACETAMINOPHEN 10 MG/ML IV SOLN
INTRAVENOUS | Status: AC
  Filled 2023-08-01: qty 100

## 2023-08-01 MED ORDER — ONDANSETRON HCL 4 MG/2ML IJ SOLN
INTRAMUSCULAR | Status: DC | PRN
  Administered 2023-08-01: 4 mg via INTRAVENOUS

## 2023-08-01 MED ORDER — LIDOCAINE 2% (20 MG/ML) 5 ML SYRINGE
INTRAMUSCULAR | Status: AC
  Filled 2023-08-01: qty 5

## 2023-08-01 MED ORDER — ROCURONIUM BROMIDE 10 MG/ML (PF) SYRINGE
PREFILLED_SYRINGE | INTRAVENOUS | Status: AC
  Filled 2023-08-01: qty 10

## 2023-08-01 MED ORDER — LIDOCAINE 2% (20 MG/ML) 5 ML SYRINGE
INTRAMUSCULAR | Status: DC | PRN
  Administered 2023-08-01: 40 mg via INTRAVENOUS

## 2023-08-01 MED ORDER — OXYCODONE HCL 5 MG PO TABS
10.0000 mg | ORAL_TABLET | ORAL | Status: DC | PRN
  Administered 2023-08-01 – 2023-08-02 (×2): 10 mg via ORAL
  Administered 2023-08-02: 15 mg via ORAL
  Administered 2023-08-03: 10 mg via ORAL
  Administered 2023-08-03 – 2023-08-04 (×3): 15 mg via ORAL
  Filled 2023-08-01 (×2): qty 3
  Filled 2023-08-01: qty 2
  Filled 2023-08-01: qty 3

## 2023-08-01 MED ORDER — BUPIVACAINE LIPOSOME 1.3 % IJ SUSP
INTRAMUSCULAR | Status: DC | PRN
  Administered 2023-08-01: 10 mL via PERINEURAL

## 2023-08-01 MED ORDER — ZINC SULFATE 220 (50 ZN) MG PO CAPS
220.0000 mg | ORAL_CAPSULE | Freq: Every day | ORAL | Status: DC
  Administered 2023-08-01 – 2023-08-05 (×5): 220 mg via ORAL
  Filled 2023-08-01 (×5): qty 1

## 2023-08-01 MED ORDER — CEFAZOLIN SODIUM-DEXTROSE 2-4 GM/100ML-% IV SOLN
2.0000 g | INTRAVENOUS | Status: AC
Start: 2023-08-01 — End: 2023-08-01
  Administered 2023-08-01: 2 g via INTRAVENOUS
  Filled 2023-08-01: qty 100

## 2023-08-01 MED ORDER — MIDAZOLAM HCL 2 MG/2ML IJ SOLN
INTRAMUSCULAR | Status: AC
  Filled 2023-08-01: qty 2

## 2023-08-01 MED ORDER — ORAL CARE MOUTH RINSE: 15.0000 mL | Freq: Once | OROMUCOSAL | Status: AC

## 2023-08-01 MED ORDER — INSULIN GLARGINE-YFGN 100 UNIT/ML ~~LOC~~ SOLN
30.0000 [IU] | Freq: Every day | SUBCUTANEOUS | Status: DC
  Administered 2023-08-01 – 2023-08-03 (×3): 30 [IU] via SUBCUTANEOUS
  Filled 2023-08-01 (×7): qty 0.3

## 2023-08-01 MED ORDER — ROPIVACAINE HCL 5 MG/ML IJ SOLN
INTRAMUSCULAR | Status: DC | PRN
  Administered 2023-08-01: 15 mL via PERINEURAL

## 2023-08-01 MED ORDER — TAMSULOSIN HCL 0.4 MG PO CAPS
0.4000 mg | ORAL_CAPSULE | Freq: Every day | ORAL | Status: DC
  Administered 2023-08-02 – 2023-08-05 (×4): 0.4 mg via ORAL
  Filled 2023-08-01 (×4): qty 1

## 2023-08-01 MED ORDER — VASHE WOUND IRRIGATION OPTIME
TOPICAL | Status: DC | PRN
  Administered 2023-08-01: 34 [oz_av]

## 2023-08-01 MED ORDER — FINASTERIDE 5 MG PO TABS
5.0000 mg | ORAL_TABLET | Freq: Every day | ORAL | Status: DC
  Administered 2023-08-02 – 2023-08-05 (×4): 5 mg via ORAL
  Filled 2023-08-01 (×4): qty 1

## 2023-08-01 MED ORDER — ONDANSETRON HCL 4 MG/2ML IJ SOLN: 4.0000 mg | Freq: Once | INTRAMUSCULAR | Status: DC | PRN

## 2023-08-01 MED ORDER — HYDROMORPHONE HCL 1 MG/ML IJ SOLN: 0.5000 mg | INTRAMUSCULAR | Status: DC | PRN

## 2023-08-01 MED ORDER — GLYCOPYRROLATE PF 0.2 MG/ML IJ SOSY
PREFILLED_SYRINGE | INTRAMUSCULAR | Status: DC | PRN
  Administered 2023-08-01: .2 mg via INTRAVENOUS

## 2023-08-01 MED ORDER — KETOROLAC TROMETHAMINE 30 MG/ML IJ SOLN
INTRAMUSCULAR | Status: DC | PRN
  Administered 2023-08-01: 15 mg via INTRAVENOUS

## 2023-08-01 MED ORDER — OXYCODONE HCL 5 MG PO TABS
5.0000 mg | ORAL_TABLET | ORAL | Status: DC | PRN
  Administered 2023-08-03: 10 mg via ORAL
  Filled 2023-08-01 (×3): qty 2
  Filled 2023-08-01: qty 1
  Filled 2023-08-01: qty 2

## 2023-08-01 MED ORDER — LABETALOL HCL 5 MG/ML IV SOLN: 10.0000 mg | INTRAVENOUS | Status: DC | PRN

## 2023-08-01 MED ORDER — BISACODYL 5 MG PO TBEC: 5.0000 mg | DELAYED_RELEASE_TABLET | Freq: Every day | ORAL | Status: DC | PRN

## 2023-08-01 MED ORDER — INSULIN ASPART 100 UNIT/ML IJ SOLN
0.0000 [IU] | Freq: Three times a day (TID) | INTRAMUSCULAR | Status: DC
  Administered 2023-08-01: 8 [IU] via SUBCUTANEOUS
  Administered 2023-08-01 – 2023-08-02 (×2): 11 [IU] via SUBCUTANEOUS
  Administered 2023-08-02: 8 [IU] via SUBCUTANEOUS
  Administered 2023-08-03: 3 [IU] via SUBCUTANEOUS
  Administered 2023-08-03 (×2): 5 [IU] via SUBCUTANEOUS
  Administered 2023-08-04 (×2): 3 [IU] via SUBCUTANEOUS
  Administered 2023-08-04: 2 [IU] via SUBCUTANEOUS
  Administered 2023-08-05 (×2): 5 [IU] via SUBCUTANEOUS
  Administered 2023-08-05: 3 [IU] via SUBCUTANEOUS

## 2023-08-01 MED ORDER — ACETAMINOPHEN 160 MG/5ML PO SOLN
325.0000 mg | ORAL | Status: DC | PRN
Start: 2023-08-01 — End: 2023-08-01

## 2023-08-01 MED ORDER — GUAIFENESIN-DM 100-10 MG/5ML PO SYRP
15.0000 mL | ORAL_SOLUTION | ORAL | Status: DC | PRN
Start: 2023-08-01 — End: 2023-08-05

## 2023-08-01 MED ORDER — SODIUM CHLORIDE 0.9 % IV SOLN: INTRAVENOUS | Status: DC | PRN

## 2023-08-01 MED ORDER — HYDRALAZINE HCL 20 MG/ML IJ SOLN: 5.0000 mg | INTRAMUSCULAR | Status: DC | PRN

## 2023-08-01 MED ORDER — KETAMINE HCL 50 MG/5ML IJ SOSY
PREFILLED_SYRINGE | INTRAMUSCULAR | Status: AC
  Filled 2023-08-01: qty 5

## 2023-08-01 MED ORDER — FENTANYL CITRATE (PF) 250 MCG/5ML IJ SOLN
INTRAMUSCULAR | Status: AC
  Filled 2023-08-01: qty 5

## 2023-08-01 MED ORDER — OXYCODONE HCL 5 MG PO TABS: 5.0000 mg | ORAL_TABLET | Freq: Once | ORAL | Status: DC | PRN

## 2023-08-01 MED ORDER — SODIUM CHLORIDE 0.9 % IV SOLN: INTRAVENOUS | Status: DC

## 2023-08-01 MED ORDER — FENTANYL CITRATE (PF) 250 MCG/5ML IJ SOLN
INTRAMUSCULAR | Status: DC | PRN
  Administered 2023-08-01 (×2): 50 ug via INTRAVENOUS

## 2023-08-01 MED ORDER — ACETAMINOPHEN 325 MG PO TABS
325.0000 mg | ORAL_TABLET | ORAL | Status: DC | PRN
Start: 2023-08-01 — End: 2023-08-01

## 2023-08-01 MED ORDER — MIRABEGRON ER 25 MG PO TB24
25.0000 mg | ORAL_TABLET | Freq: Every day | ORAL | Status: DC
  Administered 2023-08-02 – 2023-08-05 (×4): 25 mg via ORAL
  Filled 2023-08-01 (×4): qty 1

## 2023-08-01 MED ORDER — ROSUVASTATIN CALCIUM 20 MG PO TABS
40.0000 mg | ORAL_TABLET | Freq: Every day | ORAL | Status: DC
  Administered 2023-08-01 – 2023-08-05 (×5): 40 mg via ORAL
  Filled 2023-08-01 (×5): qty 2

## 2023-08-01 MED ORDER — GLYCOPYRROLATE PF 0.2 MG/ML IJ SOSY
PREFILLED_SYRINGE | INTRAMUSCULAR | Status: AC
  Filled 2023-08-01: qty 1

## 2023-08-01 MED ORDER — FESOTERODINE FUMARATE ER 4 MG PO TB24
4.0000 mg | ORAL_TABLET | Freq: Every day | ORAL | Status: DC
  Administered 2023-08-01 – 2023-08-05 (×5): 4 mg via ORAL
  Filled 2023-08-01 (×5): qty 1

## 2023-08-01 MED ORDER — PHENOL 1.4 % MT LIQD
1.0000 | OROMUCOSAL | Status: DC | PRN
  Administered 2023-08-02: 1 via OROMUCOSAL
  Filled 2023-08-01: qty 177

## 2023-08-01 MED ORDER — ROCURONIUM BROMIDE 10 MG/ML (PF) SYRINGE
PREFILLED_SYRINGE | INTRAVENOUS | Status: DC | PRN
  Administered 2023-08-01: 50 mg via INTRAVENOUS

## 2023-08-01 MED ORDER — 0.9 % SODIUM CHLORIDE (POUR BTL) OPTIME
TOPICAL | Status: DC | PRN
  Administered 2023-08-01: 1000 mL

## 2023-08-01 MED ORDER — MAGNESIUM CITRATE PO SOLN: 1.0000 | Freq: Once | ORAL | Status: DC | PRN

## 2023-08-01 MED ORDER — MIDAZOLAM HCL 2 MG/2ML IJ SOLN
2.0000 mg | Freq: Once | INTRAMUSCULAR | Status: DC
Start: 2023-08-01 — End: 2023-08-01

## 2023-08-01 MED ORDER — MEPERIDINE HCL 25 MG/ML IJ SOLN: 6.2500 mg | INTRAMUSCULAR | Status: DC | PRN

## 2023-08-01 MED ORDER — LACTATED RINGERS IV SOLN: INTRAVENOUS | Status: DC

## 2023-08-01 MED ORDER — DOCUSATE SODIUM 100 MG PO CAPS
100.0000 mg | ORAL_CAPSULE | Freq: Every day | ORAL | Status: DC
  Administered 2023-08-02 – 2023-08-05 (×4): 100 mg via ORAL
  Filled 2023-08-01 (×4): qty 1

## 2023-08-01 MED ORDER — PHENYLEPHRINE 80 MCG/ML (10ML) SYRINGE FOR IV PUSH (FOR BLOOD PRESSURE SUPPORT)
PREFILLED_SYRINGE | INTRAVENOUS | Status: AC
  Filled 2023-08-01: qty 10

## 2023-08-01 MED ORDER — PROPOFOL 10 MG/ML IV BOLUS
INTRAVENOUS | Status: DC | PRN
  Administered 2023-08-01: 100 mg via INTRAVENOUS

## 2023-08-01 MED ORDER — TRANEXAMIC ACID-NACL 1000-0.7 MG/100ML-% IV SOLN
1000.0000 mg | INTRAVENOUS | Status: AC
Start: 2023-08-01 — End: 2023-08-01
  Administered 2023-08-01: 1000 mg via INTRAVENOUS
  Filled 2023-08-01: qty 100

## 2023-08-01 MED ORDER — KETAMINE HCL 10 MG/ML IJ SOLN
INTRAMUSCULAR | Status: DC | PRN
  Administered 2023-08-01: 30 mg via INTRAVENOUS

## 2023-08-01 MED ORDER — CHLORHEXIDINE GLUCONATE 0.12 % MT SOLN
15.0000 mL | Freq: Once | OROMUCOSAL | Status: AC
  Administered 2023-08-01: 15 mL via OROMUCOSAL
  Filled 2023-08-01: qty 15

## 2023-08-01 MED ORDER — SUGAMMADEX SODIUM 200 MG/2ML IV SOLN
INTRAVENOUS | Status: DC | PRN
  Administered 2023-08-01: 140 mg via INTRAVENOUS

## 2023-08-01 MED ORDER — ONDANSETRON HCL 4 MG/2ML IJ SOLN
INTRAMUSCULAR | Status: AC
  Filled 2023-08-01: qty 2

## 2023-08-01 MED ORDER — ACETAMINOPHEN 325 MG PO TABS
325.0000 mg | ORAL_TABLET | Freq: Four times a day (QID) | ORAL | Status: DC | PRN
Start: 2023-08-02 — End: 2023-08-05
  Administered 2023-08-02: 650 mg via ORAL
  Administered 2023-08-04: 325 mg via ORAL
  Filled 2023-08-01 (×2): qty 2

## 2023-08-01 MED ORDER — MAGNESIUM SULFATE 2 GM/50ML IV SOLN: 2.0000 g | Freq: Every day | INTRAVENOUS | Status: DC | PRN

## 2023-08-01 MED ORDER — METOPROLOL TARTRATE 5 MG/5ML IV SOLN: 2.0000 mg | INTRAVENOUS | Status: DC | PRN

## 2023-08-01 MED ORDER — METFORMIN HCL 500 MG PO TABS
500.0000 mg | ORAL_TABLET | Freq: Two times a day (BID) | ORAL | Status: DC
  Administered 2023-08-01 – 2023-08-05 (×9): 500 mg via ORAL
  Filled 2023-08-01 (×9): qty 1

## 2023-08-01 MED ORDER — ALUM & MAG HYDROXIDE-SIMETH 200-200-20 MG/5ML PO SUSP
15.0000 mL | ORAL | Status: DC | PRN
Start: 2023-08-01 — End: 2023-08-05

## 2023-08-01 MED ORDER — FENTANYL CITRATE (PF) 100 MCG/2ML IJ SOLN
INTRAMUSCULAR | Status: AC
  Filled 2023-08-01: qty 2

## 2023-08-01 SURGICAL SUPPLY — 35 items
BAG COUNTER SPONGE SURGICOUNT (BAG) IMPLANT
BLADE SAW RECIP 87.9 MT (BLADE) ×1 IMPLANT
BLADE SURG 21 STRL SS (BLADE) ×1 IMPLANT
BNDG COHESIVE 6X5 TAN ST LF (GAUZE/BANDAGES/DRESSINGS) IMPLANT
CANISTER WOUND CARE 500ML ATS (WOUND CARE) ×1 IMPLANT
CLEANSER WND VASHE INSTL 34OZ (WOUND CARE) IMPLANT
COVER SURGICAL LIGHT HANDLE (MISCELLANEOUS) ×1 IMPLANT
CUFF TRNQT CYL 34X4.125X (TOURNIQUET CUFF) ×1 IMPLANT
DRAPE INCISE IOBAN 66X45 STRL (DRAPES) ×1 IMPLANT
DRAPE U-SHAPE 47X51 STRL (DRAPES) ×1 IMPLANT
DRESSING PREVENA PLUS CUSTOM (GAUZE/BANDAGES/DRESSINGS) ×1 IMPLANT
DRSG PREVENA PLUS CUSTOM (GAUZE/BANDAGES/DRESSINGS) ×1
DURAPREP 26ML APPLICATOR (WOUND CARE) ×1 IMPLANT
ELECT REM PT RETURN 9FT ADLT (ELECTROSURGICAL) ×1
ELECTRODE REM PT RTRN 9FT ADLT (ELECTROSURGICAL) ×1 IMPLANT
GLOVE BIOGEL PI IND STRL 9 (GLOVE) ×1 IMPLANT
GLOVE SURG ORTHO 9.0 STRL STRW (GLOVE) ×1 IMPLANT
GOWN STRL REUS W/ TWL XL LVL3 (GOWN DISPOSABLE) ×2 IMPLANT
GRAFT SKIN WND MICRO 38 (Tissue) IMPLANT
KIT BASIN OR (CUSTOM PROCEDURE TRAY) ×1 IMPLANT
KIT TURNOVER KIT B (KITS) ×1 IMPLANT
MANIFOLD NEPTUNE II (INSTRUMENTS) ×1 IMPLANT
NS IRRIG 1000ML POUR BTL (IV SOLUTION) ×1 IMPLANT
PACK ORTHO EXTREMITY (CUSTOM PROCEDURE TRAY) ×1 IMPLANT
PAD ARMBOARD 7.5X6 YLW CONV (MISCELLANEOUS) ×1 IMPLANT
PREVENA RESTOR ARTHOFORM 46X30 (CANNISTER) ×1 IMPLANT
SPONGE T-LAP 18X18 ~~LOC~~+RFID (SPONGE) IMPLANT
STAPLER VISISTAT 35W (STAPLE) IMPLANT
STOCKINETTE IMPERVIOUS LG (DRAPES) ×1 IMPLANT
SUT ETHILON 2 0 PSLX (SUTURE) IMPLANT
SUT SILK 2-0 18XBRD TIE 12 (SUTURE) ×1 IMPLANT
SUT VIC AB 1 CTX 27 (SUTURE) ×2 IMPLANT
TOWEL GREEN STERILE (TOWEL DISPOSABLE) ×1 IMPLANT
TUBE CONNECTING 12X1/4 (SUCTIONS) ×1 IMPLANT
YANKAUER SUCT BULB TIP NO VENT (SUCTIONS) ×1 IMPLANT

## 2023-08-01 NOTE — Anesthesia Procedure Notes (Signed)
Anesthesia Regional Block: Adductor canal block   Pre-Anesthetic Checklist: , timeout performed,  Correct Patient, Correct Site, Correct Laterality,  Correct Procedure, Correct Position, site marked,  Risks and benefits discussed,  Surgical consent,  Pre-op evaluation,  At surgeon's request and post-op pain management  Laterality: Left  Prep: chloraprep       Needles:  Injection technique: Single-shot  Needle Type: Echogenic Stimulator Needle     Needle Length: 5cm  Needle Gauge: 22     Additional Needles:   Procedures:,,,, ultrasound used (permanent image in chart),,    Narrative:  Start time: 08/01/2023 9:25 AM End time: 08/01/2023 9:30 AM Injection made incrementally with aspirations every 5 mL.  Performed by: Personally  Anesthesiologist: Bethena Midget, MD  Additional Notes: Functioning IV was confirmed and monitors were applied.  A 50mm 22ga Arrow echogenic stimulator needle was used. Sterile prep and drape,hand hygiene and sterile gloves were used. Ultrasound guidance: relevant anatomy identified, needle position confirmed, local anesthetic spread visualized around nerve(s)., vascular puncture avoided.  Image printed for medical record. Negative aspiration and negative test dose prior to incremental administration of local anesthetic. The patient tolerated the procedure well.

## 2023-08-01 NOTE — Op Note (Signed)
08/01/2023  10:50 AM  PATIENT:  Adam Ray    PRE-OPERATIVE DIAGNOSIS:  Osteomyelitis Left Foot  POST-OPERATIVE DIAGNOSIS:  Same  PROCEDURE:  LEFT BELOW KNEE AMPUTATION Application of Kerecis micro graft 38 cm  Application of Prevena customizable and Prevena arthroform wound VAC dressings Application of Vive Wear stump shrinker and the Hanger limb protector  SURGEON:  [name], MD  ANESTHESIA:   General  PREOPERATIVE INDICATIONS:  Adam Ray is a  68 y.o. male with a diagnosis of Osteomyelitis Left Foot who failed conservative measures and elected for surgical management.    The risks benefits and alternatives were discussed with the patient preoperatively including but not limited to the risks of infection, bleeding, nerve injury, cardiopulmonary complications, the need for revision surgery, among others, and the patient was willing to proceed.  OPERATIVE IMPLANTS:   Implant Name Type Inv. Item Serial No. Manufacturer Lot No. LRB No. Used Action  GRAFT SKIN WND MICRO 38 - [id number] Tissue GRAFT SKIN WND MICRO 38  [employer] [id number] Left 1 Implanted     OPERATIVE FINDINGS: Muscle had decreased color and contractility.  Arteries were calcified.  OPERATIVE PROCEDURE: Patient was brought to the operating room after undergoing a regional anesthetic.  After adequate levels anesthesia were obtained a thigh tourniquet was placed and the lower extremity was prepped using DuraPrep draped into a sterile field. The foot was draped out of the sterile field with impervious stockinette.  A timeout was called and the tourniquet inflated.  A transverse skin incision was made 12 cm distal to the tibial tubercle, the incision curved proximally, and a large posterior flap was created.  The tibia was transected just proximal to the skin incision and beveled anteriorly.  The fibula was transected just proximal to the tibial incision.  The sciatic nerve was pulled cut and allowed to  retract.  The vascular bundles were suture ligated with 2-0 silk.  The tourniquet was deflated and hemostasis obtained.        The Kerecis micro powder 38 cm was applied to the open wound that has a 200 cm surface area.    The deep and superficial fascial layers were closed using #1 Vicryl.  The skin was closed using staples.    The Prevena customizable dressing was applied this was overwrapped with the arthroform sponge.  Adam Ray was used to secure the sponges and the circumferential compression was secured to the skin with Dermatac.  This was connected to the wound VAC pump and had a good suction fit this was covered with a stump shrinker and a limb protector.  Patient was taken to the PACU in stable condition.   DISCHARGE PLANNING:  Antibiotic duration: 24-hour antibiotics  Weightbearing: Nonweightbearing on the operative extremity  Pain medication: Opioid pathway  Dressing care/ Wound VAC: Continue wound VAC with the Prevena plus pump at discharge for 1 week  Ambulatory devices: Walker or kneeling scooter  Discharge to: Discharge planning based on recommendations per physical therapy  Follow-up: In the office 1 week after discharge.

## 2023-08-01 NOTE — Anesthesia Procedure Notes (Signed)
Procedure Name: Intubation Date/Time: 08/01/2023 9:48 AM  Performed by: Ayesha Rumpf, CRNAPre-anesthesia Checklist: Patient identified, Emergency Drugs available, Suction available and Patient being monitored Patient Re-evaluated:Patient Re-evaluated prior to induction Oxygen Delivery Method: Circle System Utilized Preoxygenation: Pre-oxygenation with 100% oxygen Induction Type: IV induction Ventilation: Mask ventilation without difficulty Laryngoscope Size: Mac and 4 Grade View: Grade I Tube type: Oral Tube size: 7.5 mm Number of attempts: 1 Airway Equipment and Method: Stylet and Oral airway Placement Confirmation: ETT inserted through vocal cords under direct vision, positive ETCO2 and breath sounds checked- equal and bilateral Secured at: 23 cm Tube secured with: Tape Dental Injury: Teeth and Oropharynx as per pre-operative assessment

## 2023-08-01 NOTE — Anesthesia Postprocedure Evaluation (Signed)
Anesthesia Post Note  Patient: Adam Ray  Procedure(s) Performed: LEFT BELOW KNEE AMPUTATION (Left: Knee)     Patient location during evaluation: PACU Anesthesia Type: Regional and General Level of consciousness: awake and alert Pain management: pain level controlled Vital Signs Assessment: post-procedure vital signs reviewed and stable Respiratory status: spontaneous breathing, nonlabored ventilation, respiratory function stable and patient connected to nasal cannula oxygen Cardiovascular status: blood pressure returned to baseline and stable Postop Assessment: no apparent nausea or vomiting Anesthetic complications: no   No notable events documented.  Last Vitals:  Vitals:   08/01/23 1049 08/01/23 1100  BP: 102/64 92/62  Pulse: 96 89  Resp: 11 14  Temp: 36.5 C   SpO2: 93% 100%    Last Pain:  Vitals:   08/01/23 1049  TempSrc:   PainSc: Asleep                 Abbott Jasinski

## 2023-08-01 NOTE — Anesthesia Procedure Notes (Addendum)
Anesthesia Regional Block: Popliteal block   Pre-Anesthetic Checklist: , timeout performed,  Correct Patient, Correct Site, Correct Laterality,  Correct Procedure, Correct Position, site marked,  Risks and benefits discussed,  Surgical consent,  Pre-op evaluation,  At surgeon's request and post-op pain management  Laterality: Left  Prep: chloraprep       Needles:  Injection technique: Single-shot  Needle Type: Echogenic Stimulator Needle     Needle Length: 5cm  Needle Gauge: 22     Additional Needles:   Procedures:, nerve stimulator,,, ultrasound used (permanent image in chart),,     Nerve Stimulator or Paresthesia:  Response: foot, 0.45 mA  Additional Responses:   Narrative:  Start time: 08/01/2023 9:20 AM End time: 08/01/2023 9:25 AM Injection made incrementally with aspirations every 5 mL.  Performed by: Personally  Anesthesiologist: Collene Schlichter, MD  Additional Notes: Functioning IV was confirmed and monitors were applied.  A 50mm 22ga Arrow echogenic stimulator needle was used. Sterile prep and drape,hand hygiene and sterile gloves were used. Ultrasound guidance: relevant anatomy identified, needle position confirmed, local anesthetic spread visualized around nerve(s)., vascular puncture avoided.  Image printed for medical record. Negative aspiration and negative test dose prior to incremental administration of local anesthetic. The patient tolerated the procedure well.

## 2023-08-01 NOTE — Progress Notes (Signed)
CBG 288. Per pt's wife pt had 15 units of Lantus and 7 units of Novolog around 6 am today. Dr. Lajoyce Corners and Dr. Tacy Dura are aware. No new orders.

## 2023-08-01 NOTE — Transfer of Care (Signed)
Immediate Anesthesia Transfer of Care Note  Patient: CHRISTOS MIXSON  Procedure(s) Performed: LEFT BELOW KNEE AMPUTATION (Left: Knee)  Patient Location: PACU  Anesthesia Type:General and Regional  Level of Consciousness: drowsy, patient cooperative, and responds to stimulation  Airway & Oxygen Therapy: Patient Spontanous Breathing and Patient connected to face mask oxygen  Post-op Assessment: Report given to RN and Post -op Vital signs reviewed and stable  Post vital signs: Reviewed and stable  Last Vitals:  Vitals Value Taken Time  BP 102/64 08/01/23 1049  Temp    Pulse 98 08/01/23 1050  Resp 14 08/01/23 1050  SpO2 100 % 08/01/23 1050  Vitals shown include unfiled device data.  Last Pain:  Vitals:   08/01/23 0820  TempSrc: Oral  PainSc:          Complications: No notable events documented.

## 2023-08-01 NOTE — Progress Notes (Signed)
Labs are pending. Dr. Tacy Dura is aware. Pt is going to OR.

## 2023-08-01 NOTE — Interval H&P Note (Signed)
History and Physical Interval Note:  08/01/2023 9:54 AM  Adam Ray  has presented today for surgery, with the diagnosis of Osteomyelitis Left Foot.  The various methods of treatment have been discussed with the patient and family. After consideration of risks, benefits and other options for treatment, the patient has consented to  Procedure(s): LEFT BELOW KNEE AMPUTATION (Left) as a surgical intervention.  The patient's history has been reviewed, patient examined, no change in status, stable for surgery.  I have reviewed the patient's chart and labs.  Questions were answered to the patient's satisfaction.     [name]

## 2023-08-01 NOTE — H&P (Signed)
Adam Ray is an 68 y.o. male.   Chief Complaint: Left foot pain and ulceration HPI: Patient is a 68 year old gentleman who presents with a new ulcer left heel. He is status post a left transmetatarsal amputation. Patient complains of odor and drainage from the wound for 2 weeks. Patient denies any specific injury.   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   Status post transmetatarsal amputation of foot, 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/2018   STEMI involving left circumflex coronary artery (HCC) 07/12/2018   Coronary artery disease   Subacute osteomyelitis, right ankle and foot (HCC) 01/29/2016   Testicular mass 04/18/2016    Past Surgical History:  Procedure Laterality Date   AMPUTATION Right 01/31/2016   Procedure: Right 2nd Toe Amputation;  Surgeon: [name], MD;  Location: [facility] OR;  Service: Orthopedics;  Laterality: Right;   AMPUTATION Right 07/08/2018   Procedure: RIGHT TRANSMETATARSAL AMPUTATION;  Surgeon: [name], MD;  Location: [facility] OR;  Service: Orthopedics;  Laterality: Right;   AMPUTATION Right 01/05/2020   Procedure: RIGHT BELOW KNEE AMPUTATION;  Surgeon: [name], MD;  Location: [facility] OR;  Service: Orthopedics;  Laterality: Right;   AMPUTATION Right 02/23/2020   Procedure: RIGHT BELOW KNEE AMPUTATION REVISION;  Surgeon: [name], MD;  Location: [facility] OR;  Service: Orthopedics;  Laterality: Right;   AMPUTATION Left 10/11/2022   Procedure: LEFT GREAT  TOE AMPUTATION AND LEFT 2ND TOE AMPUTATION;  Surgeon: [name], MD;  Location: [facility] OR;  Service: Orthopedics;  Laterality: Left;   AMPUTATION Left 12/25/2022   Procedure: LEFT TRANSMETATARSAL AMPUTATION;  Surgeon: [name], MD;  Location: [facility] OR;  Service: Orthopedics;  Laterality: Left;   CATARACT EXTRACTION     right eye   COLONOSCOPY     CORONARY STENT INTERVENTION N/A 07/12/2018   Procedure: CORONARY STENT INTERVENTION;  Surgeon: Lennette Bihari, MD;  Location: [facility] INVASIVE [facility] LAB;  Service: Cardiovascular;  Laterality: N/A;   CORONARY/GRAFT ACUTE MI REVASCULARIZATION N/A 07/12/2018   Procedure: Coronary/Graft Acute MI Revascularization;  Surgeon: Lennette Bihari, MD;  Location: [facility] INVASIVE CV LAB;  Service: Cardiovascular;  Laterality: N/A;   I & D EXTREMITY  04/11/2012   Procedure: IRRIGATION AND DEBRIDEMENT EXTREMITY;  Surgeon: Toni Arthurs, MD;  Location: [facility] OR;  Service: Orthopedics;  Laterality: Right;   LEFT HEART CATH AND CORONARY ANGIOGRAPHY N/A 07/12/2018   Procedure: LEFT HEART CATH AND CORONARY ANGIOGRAPHY;  Surgeon: Lennette Bihari, MD;  Location: [facility] INV[facility]IVE CV LAB;  Service: Cardiovascular;  Laterality: N/A;   Surgery left great toe     Tear ducts bilateral eyes     TRANSMETATARSAL AMPUTATION Right 07/08/2018    Family History  Problem Relation Age of Onset   Diabetes Mother    Stroke Mother  Heart failure Father    Heart attack Father    Diabetes Sister        multiple siblings   Diabetes Brother        muliple siblings   Heart disease Brother    Colon cancer Neg Hx    Esophageal cancer Neg Hx    Rectal cancer Neg Hx    Stomach cancer Neg Hx    Social History:  reports that he quit smoking about 4 years ago. His smoking use included cigars and cigarettes. He started smoking about 60 years ago. He has a 16.8 pack-year smoking history. He has been exposed to tobacco smoke. He has quit using smokeless tobacco. He reports that he does not currently use  alcohol. He reports that he does not use drugs.  Allergies:  Allergies  Allergen Reactions   Codeine Other (See Comments)    Heart attack.   Latex Hives and Itching   Morphine Other (See Comments)    Patient preference   Propofol Other (See Comments)    Patient preference    No medications prior to admission.    No results found. However, due to the size of the patient record, not all encounters were searched. Please check Results Review for a complete set of results. XR Foot Complete Left Result Date: 07/30/2023 Radiographs of the left foot show probing of the silver nitrate stick down to the first metatarsal   Review of Systems  All other systems reviewed and are negative.   There were no vitals taken for this visit. Physical Exam  Patient is alert, oriented, no adenopathy, well-dressed, normal affect, normal respiratory effort. Examination patient has increased venous swelling in the left leg there is pitting edema up to the tibial tubercle.  Patient sits with his legs dependent.  Patient has a new superficial pressure ulcer over the left heel that is 2 cm in diameter with clear drainage there is no depth to the wound no exposed bone or tendon.  There is also an ulcer over the transmetatarsal amputation that is 2 cm in diameter with flat healthy granulation tissue no exposed bone or tendon no drainage. Assessment/Plan 1. Dehiscence of amputation stump of left lower extremity (HCC)   2. 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 Lymphedema due to venous insufficiency       Plan: Discussed radiographs with patient and family.  They understand the risk and benefit and would like to proceed with below-knee amputation on the left.  Will place him on a course of doxycycline in the interim.  [name], MD 08/01/2023, 6:42 AM

## 2023-08-02 LAB — GLUCOSE, CAPILLARY
Glucose-Capillary: 272 mg/dL — ABNORMAL HIGH (ref 70–99)
Glucose-Capillary: 323 mg/dL — ABNORMAL HIGH (ref 70–99)
Glucose-Capillary: 294 mg/dL — ABNORMAL HIGH (ref 70–99)
Glucose-Capillary: 80 mg/dL (ref 70–99)
Glucose-Capillary: 256 mg/dL — ABNORMAL HIGH (ref 70–99)

## 2023-08-02 NOTE — Progress Notes (Signed)
 Inpatient Rehab Admissions Coordinator:  ? ?Per therapy recommendations,  patient was screened for CIR candidacy by Megan Salon, MS, CCC-SLP. At this time, Pt. Appears to be a a potential candidate for CIR. I will place   order for rehab consult per protocol for full assessment. Please contact me any with questions. ? ?Megan Salon, MS, CCC-SLP ?Rehab Admissions Coordinator  ?[phone] (celll) ?[phone] (office) ? ?

## 2023-08-02 NOTE — Evaluation (Signed)
Occupational Therapy Evaluation Patient Details Name: Adam Ray MRN: [id number] DOB: [birth year] Today's Date: 08/02/2023   History of Present Illness 68 y/o male presenting to acute care with chief complaints of left foot pain and ulceration on 1/31 s/p L BKA on 1/31.  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   Clinical Impression   Pt admitted for above, he is generally weak and needs Mod to Max A +2 for bed mobility and is Max A +2 for transfer attempts, unable to offload RLE prosthetic to hop even with BUE support. Pt needing Max to CGA for ADLs at this time. Pt would benefit from continued acute skilled OT services to address listed deficits and help transition to next level of care. Patient has the potential to reach Mod I and demos the ability to tolerate 3 hours of therapy. Pt would benefit from an intensive rehab program to help maximize functional independence.       If plan is discharge home, recommend the following: A lot of help with walking and/or transfers;Two people to help with walking and/or transfers;A lot of help with bathing/dressing/bathroom;Assistance with cooking/housework;Two people to help with bathing/dressing/bathroom;Assist for transportation    Functional Status Assessment  Patient has had a recent decline in their functional status and demonstrates the ability to make significant improvements in function in a reasonable and predictable amount of time.  Equipment Recommendations  None recommended by OT (defer)    Recommendations for Other Services       Precautions / Restrictions Precautions Precautions: Fall Precaution Comments: LLE Wound Vac Required Braces or Orthoses: Other Brace Other Brace: LLE Limb Guard Brace Restrictions Weight Bearing Restrictions Per Provider Order: Yes LLE Weight Bearing Per Provider Order: Non weight bearing      Mobility Bed Mobility Overal bed mobility:  Independent Bed Mobility: Rolling, Sit to Supine Rolling: Mod assist, Used rails     Sit to supine: Max assist, Used rails        Transfers Overall transfer level: Needs assistance Equipment used: 2 person hand held assist Transfers: Sit to/from Stand, Bed to chair/wheelchair/BSC Sit to Stand: +2 safety/equipment, +2 physical assistance, Max assist   Squat pivot transfers: Max assist, +2 physical assistance       General transfer comment: Pt not able to perform hop to gait to reposition R prosthetic leg pain, Max A +2 with pads to assist with scooting to [facility] cues provided to pt to help faciliate offloading of bottom      Balance Overall balance assessment: Needs assistance Sitting-balance support: Feet unsupported, Bilateral upper extremity supported Sitting balance-Leahy Scale: Fair   Postural control: Posterior lean Standing balance support: Bilateral upper extremity supported Standing balance-Leahy Scale: Zero                             ADL either performed or assessed with clinical judgement   ADL Overall ADL's : Needs assistance/impaired Eating/Feeding: Independent;Sitting Eating/Feeding Details (indicate cue type and reason): supported sitting Grooming: Contact guard assist;Sitting   Upper Body Bathing: Sitting;Contact guard assist   Lower Body Bathing: Minimal assistance;Bed level   Upper Body Dressing : Bed level;Minimal assistance   Lower Body Dressing: Maximal assistance;Sitting/lateral leans Lower Body Dressing Details (indicate cue type and reason): doff prosthetic sitting EOB Toilet Transfer: Maximal assistance;+2 for physical assistance;+2 for safety/equipment Toilet Transfer Details (indicate cue type and reason): sim  with recliner to bed t/f Toileting- Clothing Manipulation and Hygiene: Maximal assistance;+2 for safety/equipment       Functional mobility during ADLs: +2 for safety/equipment;+2 for physical assistance;Maximal  assistance (pivot transfer) General ADL Comments: Pt started urinating in bed while OT and MS were helping with repositioning. He report he can tell when he needs to go but did not alert anyone that he was about to actively begin urinating     Vision         Perception         Praxis         Pertinent Vitals/Pain Pain Assessment Pain Assessment: Faces Faces Pain Scale: No hurt Pain Location: Pt grunting with mobility, when asked if he was in pain he stated no     Extremity/Trunk Assessment Upper Extremity Assessment Upper Extremity Assessment: Generalized weakness   Lower Extremity Assessment Lower Extremity Assessment: RLE deficits/detail;LLE deficits/detail RLE Deficits / Details: BKA RLE Sensation: WNL RLE Coordination: decreased gross motor LLE Deficits / Details: BKA LLE: Unable to fully assess due to immobilization LLE Sensation: WNL LLE Coordination: decreased gross motor   Cervical / Trunk Assessment Cervical / Trunk Assessment: Normal   Communication Communication Communication: Difficulty communicating thoughts/reduced clarity of speech   Cognition Arousal: Alert Behavior During Therapy: WFL for tasks assessed/performed Overall Cognitive Status: Within Functional Limits for tasks assessed                                       General Comments  VSS on RA    Exercises     Shoulder Instructions      Home Living Family/patient expects to be discharged to:: Private residence Living Arrangements: Spouse/significant other Available Help at Discharge: Family Type of Home: House Home Access: Ramped entrance     Home Layout: One level     Bathroom Shower/Tub: Chief Strategy Officer: Handicapped height Bathroom Accessibility: Yes   Home Equipment: Agricultural consultant (2 wheels);Cane - single point;Electric scooter          Prior Functioning/Environment Prior Level of Function : Needs assist       Physical Assist :  ADLs (physical)   ADLs (physical): Feeding;Grooming;Bathing;Dressing;Toileting;IADLs Mobility Comments: Using RW vs cane for household distances, electric scooter for community distances          OT Problem List: Decreased strength;Impaired balance (sitting and/or standing);Decreased knowledge of precautions      OT Treatment/Interventions: Self-care/ADL training;DME and/or AE instruction;Therapeutic activities;Balance training;Therapeutic exercise;Patient/family education    OT Goals(Current goals can be found in the care plan section) Acute Rehab OT Goals Patient Stated Goal: get stronger OT Goal Formulation: With patient Time For Goal Achievement: 08/16/23 Potential to Achieve Goals: Fair ADL Goals Pt Will Perform Lower Body Dressing: with set-up;sitting/lateral leans;with supervision;bed level Pt Will Transfer to Toilet: bedside commode;stand pivot transfer;with min assist;with +2 assist Pt Will Perform Toileting - Clothing Manipulation and hygiene: with contact guard assist;sitting/lateral leans Pt/caregiver will Perform Home Exercise Program: Both right and left upper extremity;With written HEP provided;With theraputty;Independently;Increased strength;With theraband  OT Frequency: Min 1X/week    Co-evaluation              AM-PAC OT "6 Clicks" Daily Activity     Outcome Measure Help from another person eating meals?: None Help from another person taking care of personal grooming?: A Little Help from another person toileting, which includes using toliet,  bedpan, or urinal?: A Lot Help from another person bathing (including washing, rinsing, drying)?: A Lot Help from another person to put on and taking off regular upper body clothing?: A Little Help from another person to put on and taking off regular lower body clothing?: A Lot 6 Click Score: 16   End of Session Equipment Utilized During Treatment: Gait belt Nurse Communication: Mobility status  Activity Tolerance:  Patient tolerated treatment well Patient left: in bed;with call bell/phone within reach  OT Visit Diagnosis: Unsteadiness on feet (R26.81);Other abnormalities of gait and mobility (R26.89);Muscle weakness (generalized) (M62.81)                Time: 1610-9604 OT Time Calculation (min): 22 min Charges:  OT General Charges $OT Visit: 1 Visit OT Evaluation $OT Eval Low Complexity: 1 Low  08/02/2023  AB, OTR/L  Acute Rehabilitation Services  Office: [phone]   Tristan Schroeder 08/02/2023, 12:03 PM

## 2023-08-02 NOTE — Progress Notes (Signed)
Patient ID: Adam Ray, male   DOB: 1955-07-09, 68 y.o.   MRN: [id number] Patient is postoperative day 1 transtibial amputation on the left.  Patient had significant venous and lymphatic insufficiency.  There is no drainage in the wound VAC canister there is a good suction fit.  Plan for discharge to skilled nursing.

## 2023-08-02 NOTE — Progress Notes (Signed)
Mobility Specialist Progress Note:   08/02/23 1218  Mobility  Activity Transferred from chair to bed  Level of Assistance Maximum assist, patient does 25-49% (+2)  Assistive Device  (HHA)  Distance Ambulated (ft) 2 ft  LLE Weight Bearing Per Provider Order NWB  Activity Response Tolerated well  Mobility Referral Yes  Mobility visit 1 Mobility  Mobility Specialist Start Time (ACUTE ONLY) 1120  Mobility Specialist Stop Time (ACUTE ONLY) 1145  Mobility Specialist Time Calculation (min) (ACUTE ONLY) 25 min   Pt received in chair, requesting to return back to bed. Pt needing MaxA +2 to stand and pivot to bed from chair. Pt unable to lateral scoot hips to top of bed needing total assist +2. Once returned to supine pt found urinating bed. Assisted with gown and chuck pad change. Pt denied any discomfort during session. Pt left in bed with call bell in reach and all needs met.   Adam Ray  Mobility Specialist Please contact via [employer] office at [phone]

## 2023-08-02 NOTE — Evaluation (Signed)
Physical Therapy Evaluation Patient Details Name: Adam Ray MRN: [id number] DOB: 06-23-1956 Today's Date: 08/02/2023  History of Present Illness  68 y/o male presenting to acute care with chief complaints of left foot pain and ulceration on 1/31 s/p L BKA on 1/31.  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  Clinical Impression  Patient lives at home with his wife. Prior to admittance to [facility], the patient was able to ambulate around his home with a RW and cane, and used a mechanical scooter when ambulating in the community. Patient was educated about his weightbearing restrictions as well as  desensitization techniques to help mitigate pain. The patient required Max A +2 for all transfers due bilateral BKA. The patient was able to reach for bed railings, however demonstrated difficulty moving legs to EOB and trunk control to sit upright was initially challenging. Once the patient was sitting upright, the patient was able to maintain his posture in a seated position using bilateral UE support. Patient required Max A +2 physical assistance for a sit>stand and squat pivot transfers to chair due to being NWB on the LLE and wearing a prosthesis on the RLE. During the assessment, the patient reported some lightheadedness when seated and vitals were assessed: BP - 121/69, O2 - 92%, Pulse - 107bpm. Patient will benefit from intensive inpatient follow-up therapy, >3 hours/day in order to improve transfers and reach an optimal level of independence.        If plan is discharge home, recommend the following: A lot of help with walking and/or transfers;A lot of help with bathing/dressing/bathroom;Assistance with cooking/housework;Assist for transportation;Help with stairs or ramp for entrance   Can travel by private vehicle        Equipment Recommendations BSC/3in1  Recommendations for Other Services  Rehab consult    Functional Status  Assessment Patient has had a recent decline in their functional status and demonstrates the ability to make significant improvements in function in a reasonable and predictable amount of time.     Precautions / Restrictions Precautions Precautions: Fall Precaution Comments: LLE Wound Vac Required Braces or Orthoses: Other Brace Other Brace: LLE Limb Guard Brace Restrictions Weight Bearing Restrictions Per Provider Order: Yes LLE Weight Bearing Per Provider Order: Non weight bearing      Mobility  Bed Mobility Overal bed mobility: Needs Assistance Bed Mobility: Supine to Sit     Supine to sit: Max assist     General bed mobility comments: Patient required assistance for trunk control and moving bilateral LEs to EOB    Transfers Overall transfer level: Needs assistance Equipment used: Rolling walker (2 wheels) Transfers: Sit to/from Stand, Bed to chair/wheelchair/BSC Sit to Stand: Max assist, +2 physical assistance     Squat pivot transfers: Max assist, +2 physical assistance          Ambulation/Gait                  Stairs            Wheelchair Mobility     Tilt Bed    Modified Rankin (Stroke Patients Only)       Balance Overall balance assessment: Needs assistance Sitting-balance support: Bilateral upper extremity supported, Feet unsupported Sitting balance-Leahy Scale: Fair Sitting balance - Comments: Patient maintains bilateral UE support when seated, RLE foot set on the floor with prosthesis on Postural control: Posterior lean   Standing balance-Leahy Scale: Zero Standing balance  comment: Patient required additonal support due to LLE NWB status and RLE prosthesis                             Pertinent Vitals/Pain Pain Assessment Pain Assessment: 0-10 Pain Score: 10-Worst pain ever Pain Location: LLE Pain Descriptors / Indicators: Aching, Burning, Sharp Pain Intervention(s): Limited activity within patient's tolerance,  Monitored during session    Home Living Family/patient expects to be discharged to:: Private residence Living Arrangements: Spouse/significant other Available Help at Discharge: Family Type of Home: House Home Access: Ramped entrance       Home Layout: One level Home Equipment: Agricultural consultant (2 wheels);Cane - single point;Electric scooter      Prior Function Prior Level of Function : Needs assist       Physical Assist : ADLs (physical)   ADLs (physical): Feeding;Grooming;Bathing;Dressing;Toileting;IADLs Mobility Comments: Using RW vs cane for household distances, electric scooter for community distances       Extremity/Trunk Assessment   Upper Extremity Assessment Upper Extremity Assessment: Overall WFL for tasks assessed    Lower Extremity Assessment Lower Extremity Assessment: RLE deficits/detail;LLE deficits/detail RLE Deficits / Details: BKA RLE Sensation: WNL RLE Coordination: decreased gross motor LLE Deficits / Details: BKA LLE: Unable to fully assess due to immobilization LLE Sensation: WNL LLE Coordination: decreased gross motor    Cervical / Trunk Assessment Cervical / Trunk Assessment: Normal  Communication   Communication Communication: Difficulty communicating thoughts/reduced clarity of speech (Just reduced clarity of speech)  Cognition Arousal: Alert Behavior During Therapy: WFL for tasks assessed/performed Overall Cognitive Status: Within Functional Limits for tasks assessed                                          General Comments General comments (skin integrity, edema, etc.): O2: 97%; Pulse: 88bpm(Before mobility)       O2: 92%; Pulse:107bpm; BP: 121/69bpm (When transitioned to EOB)    Exercises     Assessment/Plan    PT Assessment Patient needs continued PT services  PT Problem List Decreased strength;Decreased activity tolerance;Decreased balance;Decreased mobility;Decreased coordination;Pain;Decreased range of  motion       PT Treatment Interventions DME instruction;Gait training;Functional mobility training;Therapeutic activities;Therapeutic exercise;Balance training;Neuromuscular re-education;Manual techniques    PT Goals (Current goals can be found in the Care Plan section)  Acute Rehab PT Goals Patient Stated Goal: Improve mobility PT Goal Formulation: With patient Time For Goal Achievement: 08/16/23 Potential to Achieve Goals: Good    Frequency Min 1X/week     Co-evaluation               AM-PAC PT "6 Clicks" Mobility  Outcome Measure Help needed turning from your back to your side while in a flat bed without using bedrails?: A Lot Help needed moving from lying on your back to sitting on the side of a flat bed without using bedrails?: A Lot Help needed moving to and from a bed to a chair (including a wheelchair)?: A Lot Help needed standing up from a chair using your arms (e.g., wheelchair or bedside chair)?: A Lot Help needed to walk in hospital room?: Total Help needed climbing 3-5 steps with a railing? : Total 6 Click Score: 10    End of Session Equipment Utilized During Treatment: Gait belt;Oxygen Activity Tolerance: Patient tolerated treatment well Patient left: in chair;with call bell/phone within  reach;with chair alarm set   PT Visit Diagnosis: Other abnormalities of gait and mobility (R26.89);Muscle weakness (generalized) (M62.81);Difficulty in walking, not elsewhere classified (R26.2);Pain Pain - Right/Left: Left Pain - part of body: Leg    Time: 0827-0913 PT Time Calculation (min) (ACUTE ONLY): 46 min   Charges:   PT Evaluation $PT Eval Low Complexity: 1 Low PT Treatments $Therapeutic Activity: 23-37 mins PT General Charges $$ ACUTE PT VISIT: 1 Visit        Doreen Beam, SPT   Adam Ray 08/02/2023, 10:48 AM

## 2023-08-02 NOTE — Plan of Care (Signed)
  Problem: Acute Rehab PT Goals(only PT should resolve) Goal: Pt Will Go Supine/Side To Sit Outcome: Progressing Flowsheets (Taken 08/02/2023 1113) Pt will go Supine/Side to Sit: with minimal assist Goal: Patient Will Transfer Sit To/From Stand Outcome: Progressing Flowsheets (Taken 08/02/2023 1113) Patient will transfer sit to/from stand: with minimal assist Goal: Pt Will Transfer Bed To Chair/Chair To Bed Outcome: Progressing Flowsheets (Taken 08/02/2023 1113) Pt will Transfer Bed to Chair/Chair to Bed: with min assist Goal: Pt Will Perform Standing Balance Or Pre-Gait Outcome: Progressing Flowsheets (Taken 08/02/2023 1113) Pt will perform standing balance or pre-gait:  with minimal assist  with bilateral UE support Goal: Pt Will Verbalize and Adhere to Precautions While Description: PT Will Verbalize and Adhere to Precautions While Performing Mobility Outcome: Progressing

## 2023-08-03 LAB — GLUCOSE, CAPILLARY
Glucose-Capillary: 217 mg/dL — ABNORMAL HIGH (ref 70–99)
Glucose-Capillary: 204 mg/dL — ABNORMAL HIGH (ref 70–99)
Glucose-Capillary: 159 mg/dL — ABNORMAL HIGH (ref 70–99)
Glucose-Capillary: 228 mg/dL — ABNORMAL HIGH (ref 70–99)
Glucose-Capillary: 156 mg/dL — ABNORMAL HIGH (ref 70–99)

## 2023-08-03 NOTE — Progress Notes (Signed)
Patient is POD 2 s/p left transtibial amputation.  No complaints and pain is controlled.  Wound VAC sponge in place with good suction intact.  No drainage in the wound VAC canister.  Plan for discharge to skilled nursing facility versus CIR.

## 2023-08-04 ENCOUNTER — Encounter (HOSPITAL_COMMUNITY): Payer: Self-pay | Admitting: Orthopedic Surgery

## 2023-08-04 LAB — SURGICAL PATHOLOGY

## 2023-08-04 LAB — GLUCOSE, CAPILLARY
Glucose-Capillary: 169 mg/dL — ABNORMAL HIGH (ref 70–99)
Glucose-Capillary: 167 mg/dL — ABNORMAL HIGH (ref 70–99)
Glucose-Capillary: 137 mg/dL — ABNORMAL HIGH (ref 70–99)
Glucose-Capillary: 93 mg/dL (ref 70–99)

## 2023-08-04 NOTE — Progress Notes (Addendum)
Inpatient Rehab Coordinator Note:  I met with patient at bedside and spoke to wife on phone to discuss CIR recommendations and goals/expectations of CIR stay.  We reviewed 3 hrs/day of therapy, physician follow up, and average length of stay 2 weeks (dependent upon progress) with goals of Mod I. Wife works part time, (20 hours a week) Patient will likely be at a level where he can transfer to motorized wheelchair Mod I. Will proceed with prior authorization from insurance for potential CIR admission.  Rehab Admissons Coordinator [place], [place], [place] [phone]

## 2023-08-04 NOTE — PMR Pre-admission (Signed)
PMR Admission Coordinator Pre-Admission Assessment  Patient: Adam Ray is an 68 y.o., male MRN: [id number] DOB: 07-30-1955 Height: 5\' 10"  (177.8 cm) Weight: 77.1 kg  Insurance Information HMO: ***    PPO: ***     PCP: ***     IPA: ***     80/20: ***     OTHER: *** [facility] Ray)    Policy#: [id number]      Subscriber: patient CM Name: Adam Ray      Phone#: [phone]     Fax#: [phone] Pre-Cert#: ***      Employer: *** Benefits:  Phone #: ***     Name: *** Eff. Date: 11/29/2017-still active     Deduct: ***      Out of Pocket Max: ***      Life Max: *** CIR: ***      SNF: *** Outpatient: ***     Co-Pay: *** Home Health: ***      Co-Pay: *** DME: ***     Co-Pay: *** Providers: *** SECONDARY: ***      Policy#: ***     Phone#: ***  Financial Counselor: ***      Phone#: ***  The "Data Collection Information Summary" for patients in Inpatient Rehabilitation Facilities with attached "Privacy Act Statement-Health Care Records" was provided and verbally reviewed with: Patient  Emergency Contact Information Contact Information     Name Relation Home Work Mobile   Adam Ray Spouse [phone]  [phone]      Other Contacts   None on File     Current Medical History  Patient Admitting Diagnosis: L BKA History of Present Illness:  Patient is a 68 year old gentleman who [facility]23 with a new ulcer left heel. He is status post a left transmetatarsal amputation. Patient complains of odor and drainage from the wound for 2 weeks. Patient denies any specific injury. 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 Lajoyce Corners. Patient has wound vac present. Patient has history of R BKA with prosthetic.     Patient's medical record from [facility] has been reviewed by the rehabilitation admission coordinator and physician.  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 Yes  Family History   family history includes Diabetes in his brother, mother, and sister; Heart attack in his father; Heart disease in his brother; Heart failure in his father; Stroke in his mother.  Current Medications  Current  Facility-Administered Medications:    0.9 %  sodium chloride infusion, , Intravenous, Continuous, [name], MD, Last Rate: 10 mL/hr at 08/01/23 1650, New Bag at 08/01/23 1650   acetaminophen (TYLENOL) tablet 325-650 mg, 325-650 mg, Oral, Q6H PRN, [name], MD, 325 mg at 08/04/23 0513   alum & mag hydroxide-simeth (MAALOX/MYLANTA) 200-200-20 MG/5ML suspension 15-30 mL, 15-30 mL, Oral, Q2H PRN, [name], MD   ascorbic acid (VITAMIN C) tablet 1,000 mg, 1,000 mg, Oral, Daily, [name], MD, 1,000 mg at 08/04/23 8657   bisacodyl (DULCOLAX) EC tablet 5 mg, 5 mg, Oral, Daily PRN, [name], MD   docusate sodium (COLACE) capsule 100 mg, 100 mg, Oral, Daily, [name], MD, 100 mg at 08/04/23 8469   fesoterodine (TOVIAZ) tablet 4 mg, 4 mg, Oral, Daily, [name], MD, 4 mg at 08/04/23 0948   finasteride (PROSCAR) tablet 5 mg, 5 mg,  Oral, Daily, [name], MD, 5 mg at 08/04/23 0949   guaiFENesin-dextromethorphan (ROBITUSSIN DM) 100-10 MG/5ML syrup 15 mL, 15 mL, Oral, Q4H PRN, [name], MD   hydrALAZINE (APRESOLINE) injection 5 mg, 5 mg, Intravenous, Q20 Min PRN, [name], MD   HYDROmorphone (DILAUDID) injection 0.5-1 mg, 0.5-1 mg, Intravenous, Q4H PRN, [name], MD   insulin aspart (novoLOG) injection 0-15 Units, 0-15 Units, Subcutaneous, TID WC, [name], MD, 3 Units at 08/04/23 1300   insulin aspart (novoLOG) injection 4 Units, 4 Units, Subcutaneous, TID WC, [name], MD, 4 Units at 08/04/23 1300   insulin glargine-yfgn (SEMGLEE) injection 30 Units, 30 Units, Subcutaneous, QHS, [name], MD, 30 Units at 08/03/23 2317   labetalol (NORMODYNE) injection 10 mg, 10 mg, Intravenous, Q10 min PRN, [name], MD   magnesium citrate solution 1 Bottle, 1 Bottle, Oral, Once PRN, [name], MD   magnesium sulfate IVPB 2 g 50 mL, 2 g, Intravenous, Daily PRN, [name], MD   metFORMIN (GLUCOPHAGE) tablet 500 mg, 500 mg, Oral, BID WC, [name], MD, 500 mg at 08/04/23 0948   metoprolol tartrate (LOPRESSOR) injection 2-5 mg, 2-5 mg, Intravenous, Q2H PRN, [name], MD   metoprolol tartrate (LOPRESSOR) tablet 25 mg, 25 mg, Oral, BID, [name], MD, 25 mg at 08/04/23 6295   mirabegron ER (MYRBETRIQ) tablet 25 mg, 25 mg, Oral, Daily, [name], MD, 25 mg at 08/04/23 2841   nutrition supplement (JUVEN) (JUVEN) powder packet 1 packet, 1 packet, Oral, BID BM, [name], MD, 1 packet at 08/04/23 1301   ondansetron [facility]) injection 4 mg, 4 mg, Intravenous, Q6H PRN, [name], MD   oxyCODONE (Oxy IR/ROXICODONE) immediate release tablet 10-15 mg, 10-15 mg, Oral, Q4H PRN, [name], MD, 15 mg at 08/03/23 1755   oxyCODONE (Oxy IR/ROXICODONE) immediate release tablet 5-10 mg, 5-10 mg, Oral, Q4H PRN, [name], MD, 10 mg at 08/03/23 0900   pantoprazole  (PROTONIX) EC tablet 40 mg, 40 mg, Oral, Daily, [name], MD, 40 mg at 08/04/23 0948   phenol (CHLORASEPTIC) mouth spray 1 spray, 1 spray, Mouth/Throat, PRN, [name], MD, 1 spray at 08/02/23 1500   polyethylene glycol (MIRALAX / GLYCOLAX) packet 17 g, 17 g, Oral, Daily PRN, [name], MD   potassium chloride SA (KLOR-CON M) CR tablet 20-40 mEq, 20-40 mEq, Oral, Daily PRN, [name], MD   rosuvastatin (CRESTOR) tablet 40 mg, 40 mg,  Oral, q1800, [name], MD, 40 mg at 08/03/23 1756   tamsulosin [facility]) capsule 0.4 mg, 0.4 mg, Oral, Daily, [name], MD, 0.4 mg at 08/04/23 7846   zinc sulfate (50mg  elemental zinc) capsule 220 mg, 220 mg, Oral, Daily, [name], MD, 220 mg at 08/04/23 9629  Patients Current Diet:  Diet Order             Diet Carb Modified Fluid consistency: Thin; Room service appropriate? Yes  Diet effective now                   Precautions / Restrictions Precautions Precautions: Fall Precaution Comments: LLE Wound Vac Other Brace: LLE Limb Guard Brace Restrictions Weight Bearing Restrictions Per Provider Order: Yes LLE Weight Bearing Per Provider Order: Non weight bearing   Has the patient had 2 or more falls or a fall with injury in the past year? Yes  Prior Activity Level    Prior Functional Level Self Care: Did the patient need help bathing, dressing, using the toilet or eating? Independent  Indoor Mobility: Did the patient need assistance with walking from room to room (with or without device)? Independent  Stairs: Did the patient need assistance with internal or external stairs (with or without device)? Needed some help  Functional Cognition: Did the patient need help planning regular tasks such as shopping or remembering to take medications? Needed some help  Patient Information Are you of Hispanic, Latino/a,or Spanish origin?: A. No, not of Hispanic, Latino/a, or Spanish origin What is your race?: B. Black or  African American Do you need or want an interpreter to communicate with a doctor or health care staff?: 0. No  Patient's Response To:  Health Literacy and Transportation Is the patient able to respond to health literacy and transportation needs?: Yes Health Literacy - How often do you need to have someone help you when you read instructions, pamphlets, or other written material from your doctor or pharmacy?: Never In the past 12 months, has lack of transportation kept you from medical appointments or from getting medications?: No In the past 12 months, has lack of transportation kept you from meetings, work, or from getting things needed for daily living?: No  Home Assistive Devices / Equipment Home Equipment: Agricultural consultant (2 wheels), [employer] - single point, Art gallery manager  Prior Device Use: Indicate devices/aids used by the patient prior to current illness, exacerbation or injury? Motorized wheelchair or scooter, Environmental consultant, and cane  Current Functional Level Cognition  Overall Cognitive Status: Within Functional Limits for tasks assessed Orientation Level: Oriented X4    Extremity Assessment (includes Sensation/Coordination)  Upper Extremity Assessment: Generalized weakness  Lower Extremity Assessment: RLE deficits/detail, LLE deficits/detail RLE Deficits / Details: BKA RLE Sensation: WNL RLE Coordination: decreased gross motor LLE Deficits / Details: BKA LLE: Unable to fully assess due to immobilization LLE Sensation: WNL LLE Coordination: decreased gross motor    ADLs  Overall ADL's : Needs assistance/impaired Eating/Feeding: Independent, Sitting Eating/Feeding Details (indicate cue type and reason): supported sitting Grooming: Contact guard assist, Sitting Upper Body Bathing: Sitting, Contact guard assist Lower Body Bathing: Minimal assistance, Bed level Upper Body Dressing : Bed level, Minimal assistance Lower Body Dressing: Maximal assistance, Sitting/lateral leans Lower  Body Dressing Details (indicate cue type and reason): doff prosthetic sitting EOB Toilet Transfer: Maximal assistance, +2 for physical assistance, +2 for safety/equipment Toilet Transfer Details (indicate cue type and reason): sim with recliner to bed t/f Toileting- Clothing Manipulation and Hygiene: Maximal  assistance, +2 for safety/equipment Functional mobility during ADLs: +2 for safety/equipment, +2 for physical assistance, Maximal assistance (pivot transfer) General ADL Comments: Pt started urinating in bed while OT and MS were helping with repositioning. He report he can tell when he needs to go but did not alert anyone that he was about to actively begin urinating    Mobility  Overal bed mobility: Independent Bed Mobility: Rolling, Sit to Supine Rolling: Mod assist, Used rails Supine to sit: Max assist Sit to supine: Max assist, Used rails General bed mobility comments: Patient required assistance for trunk control and moving bilateral LEs to EOB    Transfers  Overall transfer level: Needs assistance Equipment used: 2 person hand held assist Transfers: Sit to/from Stand, Bed to chair/wheelchair/BSC Sit to Stand: +2 safety/equipment, +2 physical assistance, Max assist Bed to/from chair/wheelchair/BSC transfer type:: Stand pivot Squat pivot transfers: Max assist, +2 physical assistance General transfer comment: Pt not able to perform hop to gait to reposition R prosthetic leg pain, Max A +2 with pads to assist with scooting to [facility] cues provided to pt to help faciliate offloading of bottom    Ambulation / Gait / Stairs / Wheelchair Mobility       Posture / Balance Dynamic Sitting Balance Sitting balance - Comments: Patient maintains bilateral UE support when seated, RLE foot set on the floor with prosthesis on Balance Overall balance assessment: Needs assistance Sitting-balance support: Feet unsupported, Bilateral upper extremity supported Sitting balance-Leahy Scale:  Fair Sitting balance - Comments: Patient maintains bilateral UE support when seated, RLE foot set on the floor with prosthesis on Postural control: Posterior lean Standing balance support: Bilateral upper extremity supported Standing balance-Leahy Scale: Zero Standing balance comment: Patient required additonal support due to LLE NWB status and RLE prosthesis    Special needs/care consideration Wound Vac ***, Skin ***, and Diabetic management ***   Previous Home Environment (from acute therapy documentation) Living Arrangements: Spouse/significant other  Lives With: Spouse Available Help at Discharge: Family Type of Home: House Home Layout: One level Home Access: Ramped entrance Bathroom Shower/Tub: Engineer, manufacturing systems: Handicapped height Bathroom Accessibility: Yes How Accessible: Accessible via walker, Accessible via wheelchair Home Care Services: No  Discharge Living Setting Plans for Discharge Living Setting: Patient's home, Lives with (comment) (spouse) Type of Home at Discharge: House Discharge Home Layout: One level Discharge Home Access: Ramped entrance Discharge Bathroom Shower/Tub: Tub/shower unit Discharge Bathroom Toilet: Handicapped height Discharge Bathroom Accessibility: Yes How Accessible: Accessible via wheelchair, Accessible via walker Does the patient have any problems obtaining your medications?: No  Social/Family/Support Systems Anticipated Caregiver: wife, Marylene Land Anticipated Caregiver's Contact Information: [phone] Ability/Limitations of Caregiver: wife works 20 hours a week, may look into having someone stay with patient when she is at work if needed. Caregiver Availability: Intermittent Discharge Plan Discussed with Primary Caregiver: Yes Is Caregiver In Agreement with Plan?: Yes Does Caregiver/Family have Issues with Lodging/Transportation while Pt is in Rehab?: No  Goals Patient/Family Goal for Rehab: Mod I PT/OT Expected length  of stay: 10-12 days Pt/Family Agrees to Admission and willing to participate: Yes Program Orientation Provided & Reviewed with Pt/Caregiver Including Roles  & Responsibilities: Yes  Barriers to Discharge: Insurance for SNF coverage  Decrease burden of Care through IP rehab admission: Othern/a  Possible need for SNF placement upon discharge: not anticipated  Patient Condition: I have reviewed medical records from [facility], spoken with  TOC , and patient and spouse. I met with patient at the bedside and discussed  via phone for inpatient rehabilitation assessment.  Patient will benefit from ongoing PT and OT, can actively participate in 3 hours of therapy a day 5 days of the week, and can make measurable gains during the admission.  Patient will also benefit from the coordinated team approach during an Inpatient Acute Rehabilitation admission.  The patient will receive intensive therapy as well as Rehabilitation physician, nursing, social worker, and care management interventions.  Due to safety, skin/wound care, disease management, medication administration, pain management, and patient education the patient requires 24 hour a day rehabilitation nursing.  The patient is currently *** with mobility and basic ADLs.  Discharge setting and therapy post discharge at home with home health is anticipated.  Patient has agreed to participate in the Acute Inpatient Rehabilitation Program and will admit {Time; today/tomorrow:10263}.  Preadmission Screen Completed By:  Beryle Beams, 08/04/2023 1:17 PM ______________________________________________________________________   Discussed status with Dr. [name] on *** at *** and received approval for admission today.  Admission Coordinator:  Beryle Beams, time ***/Date ***   Assessment/Plan: Diagnosis: *** Does the need for close, 24 hr/day Medical supervision in concert with the patient's rehab needs make it unreasonable for this patient to be served in a  less intensive setting? {yes_no_potentially:3041433} Co-Morbidities requiring supervision/potential complications: *** Due to {due AV:4098119}, does the patient require 24 hr/day rehab nursing? {yes_no_potentially:3041433} Does the patient require coordinated care of a physician, rehab nurse, PT, OT, and SLP to address physical and functional deficits in the context of the above medical diagnosis(es)? {yes_no_potentially:3041433} Addressing deficits in the following areas: {deficits:3041436} Can the patient actively participate in an intensive therapy program of at least 3 hrs of therapy 5 days a week? {yes_no_potentially:3041433} The potential for patient to make measurable gains while on inpatient rehab is {potential:3041437} Anticipated functional outcomes upon discharge from inpatient rehab: {functional outcomes:304600100} PT, {functional outcomes:304600100} OT, {functional outcomes:304600100} SLP Estimated rehab length of stay to reach the above functional goals is: *** Anticipated discharge destination: {anticipated dc setting:21604} 10. Overall Rehab/Functional Prognosis: {potential:3041437}   MD Signature: ***

## 2023-08-04 NOTE — Plan of Care (Signed)

## 2023-08-04 NOTE — Progress Notes (Signed)
Inpatient Rehab Admissions Coordinator:   Received approval for CIR admission from the [place]. Will continue to follow for hopeful admission later this week dependent on bed availability and medical readiness.   Rehab Admissons Coordinator [place], [place], [place] [phone]

## 2023-08-04 NOTE — Progress Notes (Signed)
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    PT Comments  Patient willing and agreeable to participate with therapy today. Session began with educating the patient about improving trunk control/activation in order to ease with transitions for bed mobility and transfers. Patient was able to use good UE support and trunk control to transition from supine to a seated position. Pt required Min A +2 to shift hips to EOB, however the patient demonstrated great improvement with bed mobility. Therapists utilized a Medical sales representative transfer to w/c, requiring Max A +2 physical assistance. Patient was able to use UE support to pull himself to the chair, however additional assistance from the therapists was needed to ease transition. Patient propelled himself 48ft within the unit and was educated on w/c parts.  Patient will benefit from intensive inpatient follow-up therapy, >3 hours/day in order to improve transfers and reach an optimal level of independence.      If plan is discharge home, recommend the following: A lot of help with walking and/or transfers;A lot of help with bathing/dressing/bathroom;Assistance with cooking/housework;Assist for transportation;Help with stairs or ramp for entrance   Can travel by private vehicle        Equipment Recommendations  BSC/3in1    Recommendations for Other Services       Precautions / Restrictions Precautions Precautions: Fall Precaution Comments: LLE Wound Vac Required Braces or Orthoses: Other Brace Other Brace: LLE Limb Guard Brace Restrictions Weight Bearing Restrictions Per Provider Order: Yes LLE Weight Bearing Per  Provider Order: Non weight bearing     Mobility  Bed Mobility Overal bed mobility: Modified Independent Bed Mobility: Sit to Supine, Supine to Sit     Supine to sit: Min assist, HOB elevated, Used rails Sit to supine: Min assist   General bed mobility comments: Patient demonstrates improved trunk control needed to transition EOB. Required Min A +1 to shift hips EOB    Transfers Overall transfer level: Needs assistance Equipment used: Sliding board Transfers: Bed to chair/wheelchair/BSC            Lateral/Scoot Transfers: Max assist, +2 physical assistance General transfer comment: Patient unable to use bilateral LE support to assist with transfer due to LLE NWB and RLE prosthesis is donned    Ambulation/Gait                   Psychologist, counselling mobility: Yes Wheelchair propulsion: Both upper extremities Wheelchair parts: Supervision/cueing Distance: 81 Wheelchair Assistance Details (indicate cue type and reason): Patient educated on w/c parts, safely propells himself within the unit   Tilt Bed    Modified Rankin (Stroke Patients Only)       Balance Overall balance assessment: Needs assistance Sitting-balance support: Feet unsupported, Bilateral upper extremity supported Sitting balance-Leahy Scale: Fair Sitting balance - Comments: Patient maintains bilateral UE support when seated, RLE foot set on the floor with prosthesis on Postural control: Posterior lean  Cognition Arousal: Alert Behavior During Therapy: WFL for tasks assessed/performed Overall Cognitive Status: Within Functional Limits for tasks assessed                                          Exercises Other Exercises Other Exercises: Crunches w/ UE support 5 reps Other Exercises: Crunches w/o UE support 5 reps    General Comments        Pertinent Vitals/Pain  Pain Assessment Pain Assessment: Faces Faces Pain Scale: No hurt Pain Location: Pt grunting with mobility, when asked if he was in pain he stated no Pain Intervention(s): Monitored during session    Home Living     Available Help at Discharge: Family Type of Home: House Home Access: Ramped entrance       Home Layout: One level        Prior Function            PT Goals (current goals can now be found in the care plan section) Acute Rehab PT Goals Patient Stated Goal: Improve mobility PT Goal Formulation: With patient Time For Goal Achievement: 08/16/23 Potential to Achieve Goals: Good Progress towards PT goals: Progressing toward goals    Frequency    Min 1X/week      PT Plan      Co-evaluation              AM-PAC PT "6 Clicks" Mobility   Outcome Measure  Help needed turning from your back to your side while in a flat bed without using bedrails?: A Lot Help needed moving from lying on your back to sitting on the side of a flat bed without using bedrails?: A Lot Help needed moving to and from a bed to a chair (including a wheelchair)?: A Lot Help needed standing up from a chair using your arms (e.g., wheelchair or bedside chair)?: Total Help needed to walk in hospital room?: Total Help needed climbing 3-5 steps with a railing? : Total 6 Click Score: 9    End of Session Equipment Utilized During Treatment: Gait belt Activity Tolerance: Patient tolerated treatment well Patient left: in bed;with bed alarm set;with call bell/phone within reach   PT Visit Diagnosis: Other abnormalities of gait and mobility (R26.89);Muscle weakness (generalized) (M62.81);Difficulty in walking, not elsewhere classified (R26.2);Pain Pain - Right/Left: Left Pain - part of body: Leg     Time: 7829-5621 PT Time Calculation (min) (ACUTE ONLY): 52 min  Charges:    $Therapeutic Activity: 38-52 mins PT General Charges $$ ACUTE PT VISIT: 1 Visit                    Doreen Beam, SPT   Abigail Marsiglia 08/04/2023, 4:12 PM

## 2023-08-04 NOTE — Inpatient Diabetes Management (Signed)
Inpatient Diabetes Program Recommendations  AACE/ADA: New Consensus Statement on Inpatient Glycemic Control (2015)  Target Ranges:  Prepandial:   less than 140 mg/dL      Peak postprandial:   less than 180 mg/dL (1-2 hours)      Critically ill patients:  140 - 180 mg/dL   Lab Results  Component Value Date   GLUCAP 167 (H) 08/04/2023   HGBA1C 10.0 (H) 08/01/2023    Review of Glycemic Control  Latest Reference Range & Units 08/03/23 11:27 08/03/23 16:39 08/03/23 22:02 08/04/23 06:25 08/04/23 11:49  Glucose-Capillary 70 - 99 mg/dL 811 (H) 914 (H) 782 (H) 169 (H) 167 (H)   Diabetes history: DM  Outpatient Diabetes medications:  Trulicity 0.75 mg weekly Novolog 15 units bid Lantus 30 units q AM Metformin 500 mg bid Current orders for Inpatient glycemic control:  Novolog 0-15 units tid with meals Novolog 4 units tid with meals Semglee 30 units daily  Inpatient Diabetes Program Recommendations:    Spoke with patient regarding A1C.  He states that blood sugars are usually in the 120-140's in the AM but they do go up through out the day.  He used to have FSL 3 however states he had issues with them connecting.  He uses a reader (instead of his phone).  He states that his control was better when he was using the sensor consistently.  Told patient to call [phone] number when he has issues with FSL and to request refill on sensors.   Patient agreeable. MD please reorder freestyle Libre 3 at discharge (order [phone]).   Thanks,  Adam Cambridge, RN, BC-ADM Inpatient Diabetes Coordinator Pager [phone]  (8a-5p)

## 2023-08-05 ENCOUNTER — Encounter (HOSPITAL_COMMUNITY): Payer: Self-pay | Admitting: Physical Medicine and Rehabilitation

## 2023-08-05 ENCOUNTER — Inpatient Hospital Stay (HOSPITAL_COMMUNITY)
Admission: AD | Admit: 2023-08-05 | Discharge: 2023-08-23 | DRG: 560 | Disposition: A | Discharge status: Home Health | Payer: No Typology Code available for payment source | Source: Intra-hospital | Attending: Physical Medicine and Rehabilitation | Admitting: Physical Medicine and Rehabilitation

## 2023-08-05 ENCOUNTER — Other Ambulatory Visit: Payer: Self-pay

## 2023-08-05 DIAGNOSIS — N3281 Overactive bladder: Secondary | ICD-10-CM | POA: Diagnosis present

## 2023-08-05 DIAGNOSIS — Z79899 Other long term (current) drug therapy: Secondary | ICD-10-CM

## 2023-08-05 DIAGNOSIS — E1141 Type 2 diabetes mellitus with diabetic mononeuropathy: Secondary | ICD-10-CM

## 2023-08-05 DIAGNOSIS — R443 Hallucinations, unspecified: Secondary | ICD-10-CM | POA: Diagnosis not present

## 2023-08-05 DIAGNOSIS — H547 Unspecified visual loss: Secondary | ICD-10-CM | POA: Diagnosis present

## 2023-08-05 DIAGNOSIS — I252 Old myocardial infarction: Secondary | ICD-10-CM

## 2023-08-05 DIAGNOSIS — M109 Gout, unspecified: Secondary | ICD-10-CM | POA: Diagnosis present

## 2023-08-05 DIAGNOSIS — M6281 Muscle weakness (generalized): Secondary | ICD-10-CM | POA: Diagnosis present

## 2023-08-05 DIAGNOSIS — Z4781 Encounter for orthopedic aftercare following surgical amputation: Principal | ICD-10-CM

## 2023-08-05 DIAGNOSIS — N400 Enlarged prostate without lower urinary tract symptoms: Secondary | ICD-10-CM | POA: Diagnosis present

## 2023-08-05 DIAGNOSIS — Z9842 Cataract extraction status, left eye: Secondary | ICD-10-CM

## 2023-08-05 DIAGNOSIS — M79661 Pain in right lower leg: Secondary | ICD-10-CM | POA: Diagnosis not present

## 2023-08-05 DIAGNOSIS — Y92238 Other place in hospital as the place of occurrence of the external cause: Secondary | ICD-10-CM | POA: Diagnosis not present

## 2023-08-05 DIAGNOSIS — I350 Nonrheumatic aortic (valve) stenosis: Secondary | ICD-10-CM | POA: Diagnosis present

## 2023-08-05 DIAGNOSIS — Z87891 Personal history of nicotine dependence: Secondary | ICD-10-CM

## 2023-08-05 DIAGNOSIS — Z89511 Acquired absence of right leg below knee: Secondary | ICD-10-CM | POA: Diagnosis not present

## 2023-08-05 DIAGNOSIS — J449 Chronic obstructive pulmonary disease, unspecified: Secondary | ICD-10-CM | POA: Diagnosis present

## 2023-08-05 DIAGNOSIS — D61818 Other pancytopenia: Secondary | ICD-10-CM | POA: Diagnosis present

## 2023-08-05 DIAGNOSIS — Z8701 Personal history of pneumonia (recurrent): Secondary | ICD-10-CM

## 2023-08-05 DIAGNOSIS — Q2381 Bicuspid aortic valve: Secondary | ICD-10-CM | POA: Diagnosis not present

## 2023-08-05 DIAGNOSIS — Z7984 Long term (current) use of oral hypoglycemic drugs: Secondary | ICD-10-CM | POA: Diagnosis not present

## 2023-08-05 DIAGNOSIS — Z794 Long term (current) use of insulin: Secondary | ICD-10-CM | POA: Diagnosis not present

## 2023-08-05 DIAGNOSIS — E1151 Type 2 diabetes mellitus with diabetic peripheral angiopathy without gangrene: Secondary | ICD-10-CM | POA: Diagnosis present

## 2023-08-05 DIAGNOSIS — Z8249 Family history of ischemic heart disease and other diseases of the circulatory system: Secondary | ICD-10-CM

## 2023-08-05 DIAGNOSIS — Z9889 Other specified postprocedural states: Secondary | ICD-10-CM

## 2023-08-05 DIAGNOSIS — Z89519 Acquired absence of unspecified leg below knee: Secondary | ICD-10-CM

## 2023-08-05 DIAGNOSIS — I959 Hypotension, unspecified: Secondary | ICD-10-CM | POA: Diagnosis present

## 2023-08-05 DIAGNOSIS — Z7982 Long term (current) use of aspirin: Secondary | ICD-10-CM

## 2023-08-05 DIAGNOSIS — M7981 Nontraumatic hematoma of soft tissue: Secondary | ICD-10-CM | POA: Diagnosis present

## 2023-08-05 DIAGNOSIS — Z884 Allergy status to anesthetic agent status: Secondary | ICD-10-CM

## 2023-08-05 DIAGNOSIS — G3184 Mild cognitive impairment, so stated: Secondary | ICD-10-CM | POA: Diagnosis present

## 2023-08-05 DIAGNOSIS — Z91040 Latex allergy status: Secondary | ICD-10-CM

## 2023-08-05 DIAGNOSIS — Z7409 Other reduced mobility: Secondary | ICD-10-CM | POA: Diagnosis present

## 2023-08-05 DIAGNOSIS — Z955 Presence of coronary angioplasty implant and graft: Secondary | ICD-10-CM

## 2023-08-05 DIAGNOSIS — I2510 Atherosclerotic heart disease of native coronary artery without angina pectoris: Secondary | ICD-10-CM | POA: Diagnosis present

## 2023-08-05 DIAGNOSIS — E785 Hyperlipidemia, unspecified: Secondary | ICD-10-CM | POA: Diagnosis present

## 2023-08-05 DIAGNOSIS — I10 Essential (primary) hypertension: Secondary | ICD-10-CM | POA: Diagnosis present

## 2023-08-05 DIAGNOSIS — Z7985 Long-term (current) use of injectable non-insulin antidiabetic drugs: Secondary | ICD-10-CM

## 2023-08-05 DIAGNOSIS — K5900 Constipation, unspecified: Secondary | ICD-10-CM | POA: Diagnosis present

## 2023-08-05 DIAGNOSIS — E118 Type 2 diabetes mellitus with unspecified complications: Secondary | ICD-10-CM | POA: Diagnosis not present

## 2023-08-05 DIAGNOSIS — Z885 Allergy status to narcotic agent status: Secondary | ICD-10-CM

## 2023-08-05 DIAGNOSIS — T402X5A Adverse effect of other opioids, initial encounter: Secondary | ICD-10-CM | POA: Diagnosis not present

## 2023-08-05 DIAGNOSIS — Z860102 Personal history of hyperplastic colon polyps: Secondary | ICD-10-CM

## 2023-08-05 DIAGNOSIS — Z89512 Acquired absence of left leg below knee: Secondary | ICD-10-CM | POA: Diagnosis not present

## 2023-08-05 DIAGNOSIS — K5901 Slow transit constipation: Secondary | ICD-10-CM | POA: Diagnosis not present

## 2023-08-05 DIAGNOSIS — Z823 Family history of stroke: Secondary | ICD-10-CM

## 2023-08-05 DIAGNOSIS — Z833 Family history of diabetes mellitus: Secondary | ICD-10-CM

## 2023-08-05 DIAGNOSIS — I739 Peripheral vascular disease, unspecified: Secondary | ICD-10-CM | POA: Diagnosis not present

## 2023-08-05 LAB — GLUCOSE, CAPILLARY
Glucose-Capillary: 216 mg/dL — ABNORMAL HIGH (ref 70–99)
Glucose-Capillary: 203 mg/dL — ABNORMAL HIGH (ref 70–99)
Glucose-Capillary: 168 mg/dL — ABNORMAL HIGH (ref 70–99)
Glucose-Capillary: 225 mg/dL — ABNORMAL HIGH (ref 70–99)

## 2023-08-05 MED ORDER — INSULIN GLARGINE-YFGN 100 UNIT/ML ~~LOC~~ SOLN
15.0000 [IU] | Freq: Every day | SUBCUTANEOUS | Status: DC
  Filled 2023-08-05: qty 0.15

## 2023-08-05 MED ORDER — ACETAMINOPHEN 325 MG PO TABS
325.0000 mg | ORAL_TABLET | ORAL | Status: DC | PRN
  Administered 2023-08-05 – 2023-08-06 (×3): 650 mg via ORAL
  Administered 2023-08-07: 325 mg via ORAL
  Administered 2023-08-07 – 2023-08-08 (×2): 650 mg via ORAL
  Filled 2023-08-05 (×6): qty 2

## 2023-08-05 MED ORDER — ROSUVASTATIN CALCIUM 20 MG PO TABS
40.0000 mg | ORAL_TABLET | Freq: Every day | ORAL | Status: DC
  Administered 2023-08-06 – 2023-08-22 (×17): 40 mg via ORAL
  Filled 2023-08-05 (×17): qty 2

## 2023-08-05 MED ORDER — METHOCARBAMOL 500 MG PO TABS
500.0000 mg | ORAL_TABLET | Freq: Four times a day (QID) | ORAL | Status: DC | PRN
  Administered 2023-08-14: 500 mg via ORAL
  Filled 2023-08-05 (×2): qty 1

## 2023-08-05 MED ORDER — INSULIN ASPART 100 UNIT/ML IJ SOLN
0.0000 [IU] | Freq: Three times a day (TID) | INTRAMUSCULAR | Status: DC
  Administered 2023-08-06 – 2023-08-07 (×4): 5 [IU] via SUBCUTANEOUS
  Administered 2023-08-07: 8 [IU] via SUBCUTANEOUS
  Administered 2023-08-07 – 2023-08-08 (×3): 5 [IU] via SUBCUTANEOUS
  Administered 2023-08-08 – 2023-08-09 (×2): 2 [IU] via SUBCUTANEOUS
  Administered 2023-08-09: 5 [IU] via SUBCUTANEOUS
  Administered 2023-08-10 (×2): 3 [IU] via SUBCUTANEOUS
  Administered 2023-08-10: 2 [IU] via SUBCUTANEOUS
  Administered 2023-08-11: 5 [IU] via SUBCUTANEOUS
  Administered 2023-08-11 – 2023-08-12 (×3): 3 [IU] via SUBCUTANEOUS
  Administered 2023-08-12: 5 [IU] via SUBCUTANEOUS
  Administered 2023-08-13: 2 [IU] via SUBCUTANEOUS
  Administered 2023-08-13 (×2): 5 [IU] via SUBCUTANEOUS
  Administered 2023-08-14 – 2023-08-18 (×5): 2 [IU] via SUBCUTANEOUS

## 2023-08-05 MED ORDER — FESOTERODINE FUMARATE ER 4 MG PO TB24
4.0000 mg | ORAL_TABLET | Freq: Every day | ORAL | Status: DC
  Administered 2023-08-06 – 2023-08-23 (×18): 4 mg via ORAL
  Filled 2023-08-05 (×18): qty 1

## 2023-08-05 MED ORDER — FLEET ENEMA RE ENEM: 1.0000 | ENEMA | Freq: Once | RECTAL | Status: DC | PRN

## 2023-08-05 MED ORDER — INSULIN GLARGINE-YFGN 100 UNIT/ML ~~LOC~~ SOLN
15.0000 [IU] | Freq: Every day | SUBCUTANEOUS | Status: DC
  Administered 2023-08-05 – 2023-08-07 (×3): 15 [IU] via SUBCUTANEOUS
  Filled 2023-08-05 (×4): qty 0.15

## 2023-08-05 MED ORDER — INSULIN ASPART 100 UNIT/ML IJ SOLN
4.0000 [IU] | Freq: Three times a day (TID) | INTRAMUSCULAR | Status: DC
  Administered 2023-08-06 – 2023-08-19 (×38): 4 [IU] via SUBCUTANEOUS

## 2023-08-05 MED ORDER — ONDANSETRON HCL 4 MG PO TABS
4.0000 mg | ORAL_TABLET | Freq: Four times a day (QID) | ORAL | Status: DC | PRN
  Administered 2023-08-21: 4 mg via ORAL
  Filled 2023-08-05: qty 1

## 2023-08-05 MED ORDER — GUAIFENESIN-DM 100-10 MG/5ML PO SYRP
10.0000 mL | ORAL_SOLUTION | Freq: Four times a day (QID) | ORAL | Status: DC | PRN
Start: 2023-08-05 — End: 2023-08-23

## 2023-08-05 MED ORDER — VITAMIN C 500 MG PO TABS
1000.0000 mg | ORAL_TABLET | Freq: Every day | ORAL | Status: DC
  Administered 2023-08-06 – 2023-08-23 (×18): 1000 mg via ORAL
  Filled 2023-08-05 (×18): qty 2

## 2023-08-05 MED ORDER — PANTOPRAZOLE SODIUM 40 MG PO TBEC
40.0000 mg | DELAYED_RELEASE_TABLET | Freq: Every day | ORAL | Status: DC
  Administered 2023-08-06 – 2023-08-23 (×18): 40 mg via ORAL
  Filled 2023-08-05 (×18): qty 1

## 2023-08-05 MED ORDER — FINASTERIDE 5 MG PO TABS
5.0000 mg | ORAL_TABLET | Freq: Every day | ORAL | Status: DC
  Administered 2023-08-06 – 2023-08-21 (×16): 5 mg via ORAL
  Filled 2023-08-05 (×16): qty 1

## 2023-08-05 MED ORDER — ONDANSETRON HCL 4 MG/2ML IJ SOLN: 4.0000 mg | Freq: Four times a day (QID) | INTRAMUSCULAR | Status: DC | PRN

## 2023-08-05 MED ORDER — METFORMIN HCL 500 MG PO TABS
500.0000 mg | ORAL_TABLET | Freq: Two times a day (BID) | ORAL | Status: DC
  Administered 2023-08-06 – 2023-08-12 (×13): 500 mg via ORAL
  Filled 2023-08-05 (×13): qty 1

## 2023-08-05 MED ORDER — METOPROLOL TARTRATE 25 MG PO TABS
25.0000 mg | ORAL_TABLET | Freq: Two times a day (BID) | ORAL | Status: DC
  Administered 2023-08-05 – 2023-08-06 (×2): 25 mg via ORAL
  Filled 2023-08-05 (×2): qty 1

## 2023-08-05 MED ORDER — DIPHENHYDRAMINE HCL 25 MG PO CAPS: 25.0000 mg | ORAL_CAPSULE | Freq: Four times a day (QID) | ORAL | Status: DC | PRN

## 2023-08-05 MED ORDER — BISACODYL 5 MG PO TBEC
5.0000 mg | DELAYED_RELEASE_TABLET | Freq: Every day | ORAL | Status: DC | PRN
  Filled 2023-08-05: qty 1

## 2023-08-05 MED ORDER — OXYCODONE HCL 5 MG PO TABS: 5.0000 mg | ORAL_TABLET | ORAL | Status: DC | PRN

## 2023-08-05 MED ORDER — POLYETHYLENE GLYCOL 3350 17 G PO PACK: 17.0000 g | PACK | Freq: Every day | ORAL | Status: DC | PRN

## 2023-08-05 MED ORDER — TAMSULOSIN HCL 0.4 MG PO CAPS
0.4000 mg | ORAL_CAPSULE | Freq: Every day | ORAL | Status: DC
  Administered 2023-08-06 – 2023-08-23 (×18): 0.4 mg via ORAL
  Filled 2023-08-05 (×18): qty 1

## 2023-08-05 MED ORDER — DOCUSATE SODIUM 100 MG PO CAPS
100.0000 mg | ORAL_CAPSULE | Freq: Every day | ORAL | Status: DC
  Administered 2023-08-06 – 2023-08-09 (×4): 100 mg via ORAL
  Filled 2023-08-05 (×4): qty 1

## 2023-08-05 MED ORDER — JUVEN PO PACK
1.0000 | PACK | Freq: Two times a day (BID) | ORAL | Status: DC
  Administered 2023-08-06 – 2023-08-22 (×22): 1 via ORAL
  Filled 2023-08-05 (×22): qty 1

## 2023-08-05 MED ORDER — ZINC SULFATE 220 (50 ZN) MG PO CAPS
220.0000 mg | ORAL_CAPSULE | Freq: Every day | ORAL | Status: AC
  Administered 2023-08-06 – 2023-08-14 (×9): 220 mg via ORAL
  Filled 2023-08-05 (×9): qty 1

## 2023-08-05 MED ORDER — OXYCODONE HCL 5 MG PO TABS: 10.0000 mg | ORAL_TABLET | ORAL | Status: DC | PRN

## 2023-08-05 MED ORDER — MIRABEGRON ER 25 MG PO TB24
25.0000 mg | ORAL_TABLET | Freq: Every day | ORAL | Status: DC
  Administered 2023-08-06 – 2023-08-23 (×18): 25 mg via ORAL
  Filled 2023-08-05 (×18): qty 1

## 2023-08-05 MED ORDER — ALUM & MAG HYDROXIDE-SIMETH 200-200-20 MG/5ML PO SUSP: 30.0000 mL | ORAL | Status: DC | PRN

## 2023-08-05 MED ORDER — MELATONIN 5 MG PO TABS: 5.0000 mg | ORAL_TABLET | Freq: Every evening | ORAL | Status: DC | PRN

## 2023-08-05 NOTE — Progress Notes (Signed)
Patient ID: Adam Ray, male   DOB: Oct 31, 1955, 68 y.o.   MRN: [id number] Patient has been accepted in transfer to inpatient rehab.  Orders are placed for discharge.

## 2023-08-05 NOTE — Progress Notes (Signed)
 Inpatient Rehabilitation Admissions Coordinator   I spoke with patient at bedside and his wife by phone. She and he give permission to pursue admit to CIR today. She is currently having car issues and is delayed on her arrival today.  Heron Leavell, RN, MSN Rehab Admissions Coordinator [phone] 08/05/2023 12:25 PM

## 2023-08-05 NOTE — Progress Notes (Signed)
  Inpatient Rehabilitation Admissions Coordinator   I met with patient at bedside and spoke with his wife by phone. I have [facility] approval for [facility] and bed available today. Wife asks that I wait for her arrival by 12 noon today before arranging admit. I contacted Dr Harden and he is aware and in agreement.  Heron Leavell, RN, MSN Rehab Admissions Coordinator [phone] 08/05/2023 10:23 AM

## 2023-08-05 NOTE — Progress Notes (Signed)
 Cornelio Bouchard, MD  Physician Physical Medicine and Rehabilitation   PMR Pre-admission    Signed   Date of Service: 08/04/2023  1:17 PM  Related encounter: Admission (Current) from 08/01/2023 in [facility]   Signed     Expand All Collapse All  Show:Clear all [x] Written[x] Templated[x] Copied  Added by: [x] Riki Gehring, Heron MATSU, RN[x] Conetta, Kristyn H[x] Lovorn, Megan, MD  [] Hover for details PMR Admission Coordinator Pre-Admission Assessment   Patient: Adam Ray is an 68 y.o., male MRN: [id number] DOB: [birth year] Height: 5' 10 (177.8 cm) Weight: 77.1 kg   Insurance Information HMO:     PPO:      PCP:      IPA:      80/20:      OTHER:  [facility] [place])    Policy#: [id number]      Subscriber: patient CM Name:Cassie Hansen    Phone#:[phone]    Fax#: [phone] and email [email or web address] Pre-Cert#: [id number] approved for 30 days 2/4 until 3/6     Employer:  Benefits:  Phone #: [phone]     Name: 2/3 Eff. Date: 11/29/2017-still active     Deduct: none      Out of Pocket Max: none      Life Max: none CIR: per [place]      [place] per [place] per [place]     Co-Pay:  Home Health: per [place]      Co-Pay:  DME: per [facility]     Co-Pay:  Providers: in network  SECONDARY:[employer] Dual Complete       Policy#:[id number]      Phone#: [phone]   Financial Counselor:       Phone#:    The "Data Collection Information Summary" for patients in Inpatient Rehabilitation Facilities with attached "Privacy Act Statement-Health Care Records" was provided and verbally reviewed with: Patient   Emergency Contact Information Contact Information       Name Relation Home Work Mobile    [place] Spouse [phone]   [phone]         Other Contacts   None on File      Current Medical History  Patient Admitting Diagnosis: L BKA   History of Present Illness:  Patient is a 68 year old gentleman  who presents to [facility]/01/23 with a new ulcer left heel. He is status post a left transmetatarsal amputation. Patient complains of odor and drainage from the wound for 2 weeks. Patient denies any specific injury.    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 Harden. Patient has wound vac present. Patient has history of R BKA with prosthetic.    Patient's medical record from [facility] has been reviewed by the rehabilitation admission coordinator and physician.   Past Medical History      Past Medical History:  Diagnosis Date   Allergy     Arthritis     Bronchitis     Cataract     CHF (congestive heart failure) (HCC)     Chronic kidney disease     Claudication (HCC)      right foot ray resection   Colon polyps      hyperplastic   COPD (chronic obstructive pulmonary disease) (HCC)     Coronary artery disease     Diabetes mellitus      type II   Genital warts     Gout     Hyperlipidemia     Hypertension  Myocardial infarction [facility])     Osteomyelitis of third toe of right foot (HCC)     Pneumonia     Status post amputation of toe of right foot (HCC) 09/24/2016   Status post transmetatarsal amputation of foot, 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 Yes   Family History   family history includes Diabetes in his brother, mother, and sister; Heart attack in his father; Heart disease in his brother; Heart failure in his father; Stroke in his mother.   Current Medications  Current Medications    Current Facility-Administered Medications:    0.9 %  sodium chloride  infusion, , Intravenous, Continuous, Harden Jerona GAILS, MD, Last Rate: 10 mL/hr at 08/01/23 1650, New Bag at 08/01/23 1650   acetaminophen  (TYLENOL ) tablet  325-650 mg, 325-650 mg, Oral, Q6H PRN, Duda, Marcus V, MD, 325 mg at 08/04/23 0513   alum & mag hydroxide-simeth (MAALOX/MYLANTA) 200-200-20 MG/5ML suspension 15-30 mL, 15-30 mL, Oral, Q2H PRN, Duda, Marcus V, MD   ascorbic acid  (VITAMIN C ) tablet 1,000 mg, 1,000 mg, Oral, Daily, Harden Jerona GAILS, MD, 1,000 mg at 08/05/23 0830   bisacodyl  (DULCOLAX) EC tablet 5 mg, 5 mg, Oral, Daily PRN, Duda, Marcus V, MD   docusate sodium  (COLACE) capsule 100 mg, 100 mg, Oral, Daily, Duda, Marcus V, MD, 100 mg at 08/05/23 9168   fesoterodine  (TOVIAZ ) tablet 4 mg, 4 mg, Oral, Daily, Harden Jerona GAILS, MD, 4 mg at 08/05/23 0831   finasteride  (PROSCAR ) tablet 5 mg, 5 mg, Oral, Daily, Harden Jerona GAILS, MD, 5 mg at 08/05/23 0830   guaiFENesin -dextromethorphan (ROBITUSSIN DM) 100-10 MG/5ML syrup 15 mL, 15 mL, Oral, Q4H PRN, Duda, Marcus V, MD   hydrALAZINE  (APRESOLINE ) injection 5 mg, 5 mg, Intravenous, Q20 Min PRN, Harden Jerona GAILS, MD   HYDROmorphone  (DILAUDID ) injection 0.5-1 mg, 0.5-1 mg, Intravenous, Q4H PRN, Duda, Marcus V, MD   insulin  aspart (novoLOG ) injection 0-15 Units, 0-15 Units, Subcutaneous, TID WC, Duda, Marcus V, MD, 5 Units at 08/05/23 9366   insulin  aspart (novoLOG ) injection 4 Units, 4 Units, Subcutaneous, TID WC, Harden Jerona GAILS, MD, 4 Units at 08/05/23 0830   insulin  glargine-yfgn (SEMGLEE ) injection 15 Units, 15 Units, Subcutaneous, QHS, Duda, Marcus V, MD   labetalol  (NORMODYNE ) injection 10 mg, 10 mg, Intravenous, Q10 min PRN, Harden Jerona GAILS, MD   magnesium  citrate solution 1 Bottle, 1 Bottle, Oral, Once PRN, Harden Jerona GAILS, MD   magnesium  sulfate IVPB 2 g 50 mL, 2 g, Intravenous, Daily PRN, Duda, Marcus V, MD   metFORMIN  (GLUCOPHAGE ) tablet 500 mg, 500 mg, Oral, BID WC, Harden Jerona GAILS, MD, 500 mg at 08/05/23 9168   metoprolol  tartrate (LOPRESSOR ) injection 2-5 mg, 2-5 mg, Intravenous, Q2H PRN, Duda, Marcus V, MD   metoprolol  tartrate (LOPRESSOR ) tablet 25 mg, 25 mg, Oral, BID, Duda, Marcus V, MD, 25 mg  at 08/05/23 0830   mirabegron  ER (MYRBETRIQ ) tablet 25 mg, 25 mg, Oral, Daily, Duda, Marcus V, MD, 25 mg at 08/05/23 0831   nutrition supplement (JUVEN) (JUVEN) powder packet 1 packet, 1 packet, Oral, BID BM, Harden Jerona GAILS, MD, 1 packet at 08/05/23 0830   ondansetron  (ZOFRAN ) injection 4 mg, 4 mg, Intravenous, Q6H PRN, Duda, Marcus V, MD  oxyCODONE  (Oxy IR/ROXICODONE ) immediate release tablet 10-15 mg, 10-15 mg, Oral, Q4H PRN, Duda, Marcus V, MD, 15 mg at 08/04/23 1718   oxyCODONE  (Oxy IR/ROXICODONE ) immediate release tablet 5-10 mg, 5-10 mg, Oral, Q4H PRN, Duda, Marcus V, MD, 10 mg at 08/03/23 0900   pantoprazole  (PROTONIX ) EC tablet 40 mg, 40 mg, Oral, Daily, Duda, Marcus V, MD, 40 mg at 08/05/23 0831   phenol (CHLORASEPTIC) mouth spray 1 spray, 1 spray, Mouth/Throat, PRN, Harden Jerona GAILS, MD, 1 spray at 08/02/23 1500   polyethylene glycol (MIRALAX  / GLYCOLAX ) packet 17 g, 17 g, Oral, Daily PRN, Duda, Marcus V, MD   potassium chloride  SA (KLOR-CON  M) CR tablet 20-40 mEq, 20-40 mEq, Oral, Daily PRN, Duda, Marcus V, MD   rosuvastatin  (CRESTOR ) tablet 40 mg, 40 mg, Oral, q1800, Harden Jerona GAILS, MD, 40 mg at 08/04/23 1712   tamsulosin  (FLOMAX ) capsule 0.4 mg, 0.4 mg, Oral, Daily, Duda, Marcus V, MD, 0.4 mg at 08/05/23 0830   zinc  sulfate (50mg  elemental zinc ) capsule 220 mg, 220 mg, Oral, Daily, Duda, Marcus V, MD, 220 mg at 08/05/23 0831     Patients Current Diet:  Diet Order                  Diet Carb Modified Fluid consistency: Thin; Room service appropriate? Yes  Diet effective now                       Precautions / Restrictions Precautions Precautions: Fall Precaution Comments: LLE Wound Vac Other Brace: LLE Limb Guard Brace Restrictions Weight Bearing Restrictions Per Provider Order: Yes LLE Weight Bearing Per Provider Order: Non weight bearing    Has the patient had 2 or more falls or a fall with injury in the past year? Yes   Prior Activity Level Community (5-7x/wk): Mod  I with R prosthetic and power wheelchair   Prior Functional Level Self Care: Did the patient need help bathing, dressing, using the toilet or eating? Independent   Indoor Mobility: Did the patient need assistance with walking from room to room (with or without device)? Independent   Stairs: Did the patient need assistance with internal or external stairs (with or without device)? Needed some help   Functional Cognition: Did the patient need help planning regular tasks such as shopping or remembering to take medications? Needed some help   Patient Information Are you of Hispanic, Latino/a,or Spanish origin?: A. No, not of Hispanic, Latino/a, or Spanish origin What is your race?: B. Black or African American Do you need or want an interpreter to communicate with a doctor or health care staff?: 0. No   Patient's Response To:  Health Literacy and Transportation Is the patient able to respond to health literacy and transportation needs?: Yes Health Literacy - How often do you need to have someone help you when you read instructions, pamphlets, or other written material from your doctor or pharmacy?: Never In the past 12 months, has lack of transportation kept you from medical appointments or from getting medications?: No In the past 12 months, has lack of transportation kept you from meetings, work, or from getting things needed for daily living?: No   Home Assistive Devices / Equipment Home Equipment: Agricultural Consultant (2 wheels), [employer] - single point, Art gallery manager   Prior Device Use: Indicate devices/aids used by the patient prior to current illness, exacerbation or injury? Motorized wheelchair or scooter, Environmental Consultant, and cane   Current Functional Level Cognition   Overall  Cognitive Status: Within Functional Limits for tasks assessed Orientation Level: Oriented X4    Extremity Assessment (includes Sensation/Coordination)   Upper Extremity Assessment: Generalized weakness  Lower Extremity  Assessment: RLE deficits/detail, LLE deficits/detail RLE Deficits / Details: BKA RLE Sensation: WNL RLE Coordination: decreased gross motor LLE Deficits / Details: BKA LLE: Unable to fully assess due to immobilization LLE Sensation: WNL LLE Coordination: decreased gross motor     ADLs   Overall ADL's : Needs assistance/impaired Eating/Feeding: Independent, Sitting Eating/Feeding Details (indicate cue type and reason): supported sitting Grooming: Contact guard assist, Sitting Upper Body Bathing: Sitting, Contact guard assist Lower Body Bathing: Minimal assistance, Bed level Upper Body Dressing : Bed level, Minimal assistance Lower Body Dressing: Maximal assistance, Sitting/lateral leans Lower Body Dressing Details (indicate cue type and reason): doff prosthetic sitting EOB Toilet Transfer: Maximal assistance, +2 for physical assistance, +2 for safety/equipment Toilet Transfer Details (indicate cue type and reason): sim with recliner to bed t/f Toileting- Clothing Manipulation and Hygiene: Maximal assistance, +2 for safety/equipment Functional mobility during ADLs: +2 for safety/equipment, +2 for physical assistance, Maximal assistance (pivot transfer) General ADL Comments: Pt started urinating in bed while OT and MS were helping with repositioning. He report he can tell when he needs to go but did not alert anyone that he was about to actively begin urinating     Mobility   Overal bed mobility: Modified Independent Bed Mobility: Sit to Supine, Supine to Sit Rolling: Mod assist, Used rails Supine to sit: Min assist, HOB elevated, Used rails Sit to supine: Min assist General bed mobility comments: Patient demonstrates improved trunk control needed to transition EOB. Required Min A +1 to shift hips EOB     Transfers   Overall transfer level: Needs assistance Equipment used: Sliding board Transfers: Bed to chair/wheelchair/BSC Sit to Stand: +2 safety/equipment, +2 physical assistance,  Max assist Bed to/from chair/wheelchair/BSC transfer type:: Lateral/scoot transfer Squat pivot transfers: Max assist, +2 physical assistance  Lateral/Scoot Transfers: Max assist, +2 physical assistance General transfer comment: Patient unable to use bilateral LE support to assist with transfer due to LLE NWB and RLE prosthesis is donned     Ambulation / Gait / Stairs / Theatre Manager mobility: Yes Wheelchair propulsion: Both upper extremities Wheelchair parts: Supervision/cueing Distance: 81 Wheelchair Assistance Details (indicate cue type and reason): Patient educated on w/c parts, safely propells himself within the unit     Posture / Balance Dynamic Sitting Balance Sitting balance - Comments: Patient maintains bilateral UE support when seated, RLE foot set on the floor with prosthesis on Balance Overall balance assessment: Needs assistance Sitting-balance support: Feet unsupported, Bilateral upper extremity supported Sitting balance-Leahy Scale: Fair Sitting balance - Comments: Patient maintains bilateral UE support when seated, RLE foot set on the floor with prosthesis on Postural control: Posterior lean Standing balance support: Bilateral upper extremity supported Standing balance-Leahy Scale: Zero Standing balance comment: Patient required additonal support due to LLE NWB status and RLE prosthesis     Special needs/care consideration Wound VAC to operative site Power Wheelchair      Previous Home Environment Living Arrangements: Spouse/significant other  Lives With: Spouse Available Help at Discharge: Family Type of Home: House Home Layout: One level Home Access: Ramped entrance Bathroom Shower/Tub: Engineer, Manufacturing Systems: Handicapped height Bathroom Accessibility: Yes How Accessible: Accessible via walker, Accessible via wheelchair Home Care Services: No   Discharge Living Setting Plans for Discharge Living Setting:  Patient's home, Lives with (comment) (spouse) Type  of Home at Discharge: House Discharge Home Layout: One level Discharge Home Access: Ramped entrance Discharge Bathroom Shower/Tub: Tub/shower unit Discharge Bathroom Toilet: Handicapped height Discharge Bathroom Accessibility: Yes How Accessible: Accessible via wheelchair, Accessible via walker Does the patient have any problems obtaining your medications?: No   Social/Family/Support Systems Anticipated Caregiver: wife, Jon Anticipated Caregiver's Contact Information: [phone] Ability/Limitations of Caregiver: wife works 20 hours a week, may look into having someone stay with patient when she is at work if needed. Caregiver Availability: Intermittent Discharge Plan Discussed with Primary Caregiver: Yes Is Caregiver In Agreement with Plan?: Yes Does Caregiver/Family have Issues with Lodging/Transportation while Pt is in Rehab?: No   Goals Patient/Family Goal for Rehab: Mod I PT/OT Expected length of stay: 10-12 days Pt/Family Agrees to Admission and willing to participate: Yes Program Orientation Provided & Reviewed with Pt/Caregiver Including Roles  & Responsibilities: Yes  Barriers to Discharge: Insurance for SNF coverage   Decrease burden of Care through IP rehab admission: Othern/a   Possible need for SNF placement upon discharge: not anticipated   Patient Condition: I have reviewed medical records from [facility], spoken with  TOC , and patient and spouse. I met with patient at the bedside and discussed via phone for inpatient rehabilitation assessment.  Patient will benefit from ongoing PT and OT, can actively participate in 3 hours of therapy a day 5 days of the week, and can make measurable gains during the admission.  Patient will also benefit from the coordinated team approach during an Inpatient Acute Rehabilitation admission.  The patient will receive intensive therapy as well as Rehabilitation physician, nursing,  social worker, and care management interventions.  Due to safety, skin/wound care, disease management, medication administration, pain management, and patient education the patient requires 24 hour a day rehabilitation nursing.  The patient is currently max assist slide board transfers with mobility and basic ADLs.  Discharge setting and therapy post discharge at home with home health is anticipated.  Patient has agreed to participate in the Acute Inpatient Rehabilitation Program and will admit today.   Preadmission Screen Completed By:  Alison Heron Lot, 08/05/2023 10:52 AM ______________________________________________________________________   Discussed status with Dr. Cornelio on 08/05/23 at 1052 and received approval for admission today.   Admission Coordinator:  Alison Heron Lot, RN, time [id number] Date 08/05/23    Assessment/Plan: Diagnosis: L BKA in setting of R BKA Does the need for close, 24 hr/day Medical supervision in concert with the patient's rehab needs make it unreasonable for this patient to be served in a less intensive setting? Yes Co-Morbidities requiring supervision/potential complications: DM, R BKA; hypoglycemia; HTN; ost op and phantom pain Due to bladder management, bowel management, safety, skin/wound care, disease management, medication administration, pain management, and patient education, does the patient require 24 hr/day rehab nursing? Yes Does the patient require coordinated care of a physician, rehab nurse, PT, OT, and SLP to address physical and functional deficits in the context of the above medical diagnosis(es)? Yes Addressing deficits in the following areas: balance, endurance, strength, transferring, bowel/bladder control, bathing, dressing, feeding, grooming, and toileting Can the patient actively participate in an intensive therapy program of at least 3 hrs of therapy 5 days a week? Yes The potential for patient to make measurable gains while on inpatient  rehab is good Anticipated functional outcomes upon discharge from inpatient rehab: modified independent PT, modified independent OT, n/a SLP Estimated rehab length of stay to reach the above functional goals is: 10-12 days- at w/c level  Anticipated discharge destination: Home 10. Overall Rehab/Functional Prognosis: good     MD Signature:            Revision History

## 2023-08-05 NOTE — H&P (Signed)
 Physical Medicine and Rehabilitation Admission H&P   CC: Functional deficits secondary to bilateral lower extremity amputations- new L BKA  HPI: Adam Ray is a 68 year old diabetic with PAD and HTN who presented for left below amputation on 08/01/2023. He presented with a new ulcer left heel. He was status post a left transmetatarsal amputation. He complained of odor and drainage from the wound for 2 weeks. There was also an ulcer over the transmetatarsal amputation that is 2 cm in diameter with flat healthy granulation tissue no exposed bone or tendon no drainage. Patient denied any specific injury. Prevena wound VAC in place. 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 Wears right lower extremity prosthesis. And uses scooter to get around.    History of DM with A1c of 10% on 08/01/2023, CAD, moderate AS, hypertension, hyperlipidemia, pancytopenia on B12 injections monthly. Patient required Min A +2 to shift hips to EOB, however the patient demonstrated great improvement with bed mobility. The patient requires inpatient medicine and rehabilitation evaluations and services for ongoing dysfunction secondary to bilateral lower extremity amputations.   Awake and alert.Positioned in bed and provided dinner tray. Good appetite. Says pain in leg is bad, but I can stand it.  Said pain yesterday was better today then yesterday. Admits not sure able to call wife- couldn't remember her number.  Said usually has 5 Bms/day but no BM since surgery.   Review of Systems  Constitutional:  Negative for chills and fever.  Respiratory:  Negative for cough and shortness of breath.   Gastrointestinal:  Negative for nausea and vomiting.  Genitourinary:  Positive for urgency. Negative for dysuria.  Neurological:  Negative for dizziness and headaches.  Psychiatric/Behavioral:  Negative for depression. The patient does not have insomnia.   All other systems reviewed and are negative.  Past Medical History:  Diagnosis Date    Allergy    Arthritis    Bronchitis    Cataract    CHF (congestive heart failure) (HCC)    Chronic kidney disease    Claudication (HCC)    right foot ray resection   Colon polyps    hyperplastic   COPD (chronic obstructive pulmonary disease) (HCC)    Coronary artery disease    Diabetes mellitus    type II   Genital warts    Gout    Hyperlipidemia    Hypertension    Myocardial infarction (HCC)    Osteomyelitis of third toe of right foot (HCC)    Pneumonia    Status post amputation of toe of right foot (HCC) 09/24/2016   Status post transmetatarsal amputation of foot, 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/2018   STEMI involving left circumflex coronary artery (HCC) 07/12/2018   Coronary artery disease   Subacute osteomyelitis, right ankle and foot (HCC) 01/29/2016   Testicular mass 04/18/2016   Past Surgical History:  Procedure Laterality Date   AMPUTATION Right 01/31/2016   Procedure: Right 2nd Toe Amputation;  Surgeon: Jerona LULLA Sage, MD;  Location: [facility] OR;  Service: Orthopedics;  Laterality: Right;   AMPUTATION Right 07/08/2018   Procedure: RIGHT TRANSMETATARSAL AMPUTATION;  Surgeon: Sage Jerona LULLA, MD;  Location: [facility] OR;  Service: Orthopedics;  Laterality: Right;   AMPUTATION Right 01/05/2020   Procedure: RIGHT BELOW KNEE AMPUTATION;  Surgeon: Sage Jerona LULLA, MD;  Location: [facility] OR;  Service: Orthopedics;  Laterality: Right;   AMPUTATION Right 02/23/2020   Procedure: RIGHT BELOW KNEE AMPUTATION REVISION;  Surgeon: Sage Jerona LULLA, MD;  Location: [facility] OR;  Service: Orthopedics;  Laterality: Right;   AMPUTATION Left 10/11/2022   Procedure: LEFT GREAT TOE AMPUTATION AND LEFT 2ND TOE AMPUTATION;  Surgeon: Harden Jerona GAILS, MD;  Location: [facility] OR;  Service: Orthopedics;  Laterality: Left;   AMPUTATION Left 12/25/2022   Procedure: LEFT TRANSMETATARSAL AMPUTATION;  Surgeon: Harden Jerona GAILS, MD;  Location: [facility] OR;  Service: Orthopedics;  Laterality: Left;   AMPUTATION Left 08/01/2023   Procedure: LEFT BELOW KNEE AMPUTATION;   Surgeon: Harden Jerona GAILS, MD;  Location: [facility] OR;  Service: Orthopedics;  Laterality: Left;   CATARACT EXTRACTION     right eye   COLONOSCOPY     CORONARY STENT INTERVENTION N/A 07/12/2018   Procedure: CORONARY STENT INTERVENTION;  Surgeon: Burnard Debby LABOR, MD;  Location: [facility] INVASIVE [facility] LAB;  Service: Cardiovascular;  Laterality: N/A;   CORONARY/GRAFT ACUTE MI REVASCULARIZATION N/A 07/12/2018   Procedure: Coronary/Graft Acute MI Revascularization;  Surgeon: Burnard Debby LABOR, MD;  Location: [facility] INVAS[facility] CV LAB;  Service: Cardiovascular;  Laterality: N/A;   I & D EXTREMITY  04/11/2012   Procedure: IRRIGATION AND DEBRIDEMENT EXTREMITY;  Surgeon: Norleen Armor, MD;  Location: [facility] OR;  Service: Orthopedics;  Laterality: Right;   LEFT HEART CATH AND CORONARY ANGIOGRAPHY N/A 07/12/2018   Procedure: LEFT HEART CATH AND CORONARY ANGIOGRAPHY;  Surgeon: Burnard Debby LABOR, MD;  Location: [facility] INVASIVE CV LAB;  Service: Cardiovascular;  Laterality: N/A;   Surgery left great toe     Tear ducts bilateral eyes     TRANSMETATARSAL AMPUTATION Right 07/08/2018   Family History  Problem Relation Age of Onset   Diabetes Mother    Stroke Mother    Heart failure Father    Heart attack Father    Diabetes Sister        multiple siblings   Diabetes Brother        muliple siblings   Heart disease Brother    Colon cancer Neg Hx    Esophageal cancer Neg Hx    Rectal cancer Neg Hx    Stomach cancer Neg Hx    Social History:  reports that he quit smoking about 4 years ago. His smoking use included cigars and cigarettes. He started smoking about 60 years ago. He has a 16.8 pack-year smoking history. He has been exposed to tobacco smoke. He has quit using smokeless tobacco. He reports that he does not currently use alcohol. He reports that he does not use drugs. Allergies:  Allergies  Allergen Reactions   Codeine  Other (See Comments)    Heart attack.   Latex Hives and Itching   Morphine  Other (See Comments)    Patient  preference   Propofol  Other (See Comments)    Patient preference   Medications Prior to Admission  Medication Sig Dispense Refill   aspirin  81 MG chewable tablet Chew 81 mg by mouth daily.     CINNAMON PO Take 1,200 mg by mouth 2 (two) times daily. [place]     Continuous Glucose Receiver (FREESTYLE LIBRE 3 READER) DEVI 1 each by Does not apply route as directed. Use with sensor to monitor blood sugar 1 each 0   cyanocobalamin  (VITAMIN B12) 1000 MCG/ML injection INJECT 1 ML (1,000 MCG TOTAL) INTO THE MUSCLE EVERY 30 DAYS. 3 mL 2   Dulaglutide  (TRULICITY ) 0.75 MG/0.5ML SOAJ Inject 0.75 mg into the skin every 30 (thirty) days.     famotidine  (PEPCID ) 20 MG tablet TAKE 1 TABLET (20 MG TOTAL) BY MOUTH 2 (TWO) TIMES DAILY AS NEEDED FOR HEARTBURN OR INDIGESTION. (  Patient taking differently: Take 20 mg by mouth daily.) 180 tablet 1   finasteride  (PROSCAR ) 5 MG tablet Take 5 mg by mouth daily.     furosemide  (LASIX ) 20 MG tablet TAKE 1 TABLET BY MOUTH TWICE A DAY (Patient taking differently: Take 20 mg by mouth daily.) 180 tablet 3   guaifenesin  (HUMIBID E) 400 MG TABS tablet Take 400 mg by mouth 2 (two) times daily.     insulin  aspart (NOVOLOG ) 100 UNIT/ML injection Inject 15 Units into the skin 2 (two) times daily.     insulin  glargine (LANTUS  SOLOSTAR) 100 UNIT/ML Solostar Pen INJECT 40 UNITS INTO THE SKIN DAILY (Patient taking differently: Inject 30 Units into the skin in the morning.) 45 mL 1   metFORMIN  (GLUCOPHAGE ) 500 MG tablet TAKE 1 TABLET BY MOUTH 2 TIMES DAILY WITH A MEAL. (Patient taking differently: Take 500 mg by mouth 2 (two) times daily with a meal.) 180 tablet 1   metoprolol  tartrate (LOPRESSOR ) 25 MG tablet Take 25 mg by mouth 2 (two) times daily.     Multiple Vitamin (MULTIVITAMIN WITH MINERALS) TABS tablet Take 1 tablet by mouth daily.  iron     OVER THE COUNTER MEDICATION Take 1 tablet by mouth 2 (two) times daily. [place] cinnamon     rosuvastatin  (CRESTOR ) 40 MG tablet Take 1 tablet  (40 mg total) by mouth daily at 6 PM. (Patient taking differently: Take 20 mg by mouth at bedtime.)     tamsulosin  (FLOMAX ) 0.4 MG CAPS capsule Take 1 capsule (0.4 mg total) by mouth daily. 30 capsule 3   Vibegron (GEMTESA) 75 MG TABS Take 75 mg by mouth daily.     vitamin B-12 (CYANOCOBALAMIN ) 500 MCG tablet Take 500 mcg by mouth 2 (two) times daily.     acetaminophen  (TYLENOL ) 500 MG tablet Take 1,500 mg by mouth every 6 (six) hours as needed for moderate pain, mild pain or headache.     Continuous Glucose Sensor (FREESTYLE LIBRE 3 SENSOR) MISC 1 each by Does not apply route every 14 (fourteen) days. Place 1 sensor on the skin every 14 days. Use to check glucose continuously 2 each 11   doxycycline  (VIBRA -TABS) 100 MG tablet Take 1 tablet (100 mg total) by mouth 2 (two) times daily. 60 tablet 0   sildenafil  (VIAGRA ) 100 MG tablet Take 100 mg by mouth daily as needed for erectile dysfunction.     tolterodine (DETROL LA) 4 MG 24 hr capsule Take 4 mg by mouth daily.        Home: Home Living Family/patient expects to be discharged to:: Private residence Living Arrangements: Spouse/significant other Available Help at Discharge: Family Type of Home: House Home Access: Ramped entrance Home Layout: One level Bathroom Shower/Tub: Engineer, Manufacturing Systems: Handicapped height Bathroom Accessibility: Yes Home Equipment: Agricultural Consultant (2 wheels), [employer] - single point, Art gallery manager  Lives With: Spouse   Functional History: Prior Function Prior Level of Function : Needs assist Physical Assist : ADLs (physical) ADLs (physical): Feeding, Grooming, Bathing, Dressing, Toileting, IADLs Mobility Comments: Using RW vs cane for household distances, art gallery manager for community distances  Functional Status:  Mobility: Bed Mobility Overal bed mobility: Modified Independent Bed Mobility: Sit to Supine, Supine to Sit Rolling: Mod assist, Used rails Supine to sit: Min assist, HOB elevated,  Used rails Sit to supine: Min assist General bed mobility comments: Patient demonstrates improved trunk control needed to transition EOB. Required Min A +1 to shift hips EOB Transfers Overall transfer level: Needs  assistance Equipment used: Sliding board Transfers: Bed to chair/wheelchair/BSC Sit to Stand: +2 safety/equipment, +2 physical assistance, Max assist Bed to/from chair/wheelchair/BSC transfer type:: Lateral/scoot transfer Squat pivot transfers: Max assist, +2 physical assistance  Lateral/Scoot Transfers: Max assist, +2 physical assistance General transfer comment: Patient unable to use bilateral LE support to assist with transfer due to LLE NWB and RLE prosthesis is donned   Wheelchair Mobility Wheelchair mobility: Yes Wheelchair propulsion: Both upper extremities Wheelchair parts: Supervision/cueing Distance: 81 Wheelchair Assistance Details (indicate cue type and reason): Patient educated on w/c parts, safely propells himself within the unit  ADL: ADL Overall ADL's : Needs assistance/impaired Eating/Feeding: Independent, Sitting Eating/Feeding Details (indicate cue type and reason): supported sitting Grooming: Contact guard assist, Sitting Upper Body Bathing: Sitting, Contact guard assist Lower Body Bathing: Minimal assistance, Bed level Upper Body Dressing : Bed level, Minimal assistance Lower Body Dressing: Maximal assistance, Sitting/lateral leans Lower Body Dressing Details (indicate cue type and reason): doff prosthetic sitting EOB Toilet Transfer: Maximal assistance, +2 for physical assistance, +2 for safety/equipment Toilet Transfer Details (indicate cue type and reason): sim with recliner to bed t/f Toileting- Clothing Manipulation and Hygiene: Maximal assistance, +2 for safety/equipment Functional mobility during ADLs: +2 for safety/equipment, +2 for physical assistance, Maximal assistance (pivot transfer) General ADL Comments: Pt started urinating in bed while  OT and MS were helping with repositioning. He report he can tell when he needs to go but did not alert anyone that he was about to actively begin urinating  Cognition: Cognition Overall Cognitive Status: Within Functional Limits for tasks assessed Orientation Level: Oriented X4 Cognition Arousal: Alert Behavior During Therapy: WFL for tasks assessed/performed Overall Cognitive Status: Within Functional Limits for tasks assessed  Physical Exam: Blood pressure (!) 98/51, pulse 71, temperature 98.7 F (37.1 C), temperature source Oral, resp. rate 17, height 5' 10 (1.778 m), weight 77.1 kg, SpO2 96%. Physical Exam Vitals and nursing note reviewed.  Constitutional:      General: He is not in acute distress.    Comments: Confused- not able to dial wife's number on phone, sitting up in bed- scooter at bedside- not wearing R prosthesis; NAD  HENT:     Head: Normocephalic and atraumatic.     Comments: No teeth    Right Ear: External ear normal.     Left Ear: External ear normal.     Nose: Nose normal. No congestion.     Mouth/Throat:     Mouth: Mucous membranes are dry.     Pharynx: Oropharynx is clear. No oropharyngeal exudate.  Eyes:     General:        Right eye: No discharge.        Left eye: No discharge.     Extraocular Movements: Extraocular movements intact.  Cardiovascular:     Rate and Rhythm: Normal rate and regular rhythm.     Heart sounds: Normal heart sounds. No murmur heard.    No gallop.  Pulmonary:     Effort: Pulmonary effort is normal. No respiratory distress.     Breath sounds: Normal breath sounds. No wheezing, rhonchi or rales.  Abdominal:     Palpations: Abdomen is soft.     Comments: Soft, but distended- hypoactive BS  Musculoskeletal:     Cervical back: Neck supple. No tenderness.     Comments: Left prevena dressing in place with good seal and no output. Black stocking in place. R BKA healed- prosthesis in room Ue's 5/5 and RLE 5/5 ending in BKA  LLE-  at least 3/5 - has L BKA  Skin:    General: Skin is warm and dry.     Comments: VAC in place on LLE- no drainage in cannister  Neurological:     General: No focal deficit present.     Mental Status: He is alert and oriented to person, place, and time.  Psychiatric:        Mood and Affect: Mood normal.        Thought Content: Thought content normal.     Results for orders placed or performed during the hospital encounter of 08/01/23 (from the past 48 hours)  Glucose, capillary     Status: Abnormal   Collection Time: 08/03/23 11:27 AM  Result Value Ref Range   Glucose-Capillary 159 (H) 70 - 99 mg/dL    Comment: Glucose reference range applies only to samples taken after fasting for at least 8 hours.  Glucose, capillary     Status: Abnormal   Collection Time: 08/03/23  4:39 PM  Result Value Ref Range   Glucose-Capillary 228 (H) 70 - 99 mg/dL    Comment: Glucose reference range applies only to samples taken after fasting for at least 8 hours.  Glucose, capillary     Status: Abnormal   Collection Time: 08/03/23 10:02 PM  Result Value Ref Range   Glucose-Capillary 156 (H) 70 - 99 mg/dL    Comment: Glucose reference range applies only to samples taken after fasting for at least 8 hours.  Glucose, capillary     Status: Abnormal   Collection Time: 08/04/23  6:25 AM  Result Value Ref Range   Glucose-Capillary 169 (H) 70 - 99 mg/dL    Comment: Glucose reference range applies only to samples taken after fasting for at least 8 hours.  Glucose, capillary     Status: Abnormal   Collection Time: 08/04/23 11:49 AM  Result Value Ref Range   Glucose-Capillary 167 (H) 70 - 99 mg/dL    Comment: Glucose reference range applies only to samples taken after fasting for at least 8 hours.  Glucose, capillary     Status: Abnormal   Collection Time: 08/04/23  4:35 PM  Result Value Ref Range   Glucose-Capillary 137 (H) 70 - 99 mg/dL    Comment: Glucose reference range applies only to samples taken after  fasting for at least 8 hours.  Glucose, capillary     Status: None   Collection Time: 08/04/23 10:15 PM  Result Value Ref Range   Glucose-Capillary 93 70 - 99 mg/dL    Comment: Glucose reference range applies only to samples taken after fasting for at least 8 hours.  Glucose, capillary     Status: Abnormal   Collection Time: 08/05/23  6:23 AM  Result Value Ref Range   Glucose-Capillary 216 (H) 70 - 99 mg/dL    Comment: Glucose reference range applies only to samples taken after fasting for at least 8 hours.   *Note: Due to a large number of results and/or encounters for the requested time period, some results have not been displayed. A complete set of results can be found in Results Review.   No results found.    Blood pressure (!) 98/51, pulse 71, temperature 98.7 F (37.1 C), temperature source Oral, resp. rate 17, height 5' 10 (1.778 m), weight 77.1 kg, SpO2 96%.  Medical Problem List and Plan: 1. Functional deficits secondary to L BKA in setting of R BKA  -patient may not  shower while VAC in  place  -ELOS/Goals: 10-12 days at w/c level  2.  Antithrombotics: This patient has no DVT prophlaxis- -DVT/anticoagulation:     -antiplatelet therapy: none  3. Pain Management: Tylenol , oxycodone  as needed  4. Mood/Behavior/Sleep: LCSW to evaluate and provide emotional support  -antipsychotic agents: n/a  5. Neuropsych/cognition: currently, patient is not  capable of making decisions on his  own behalf. Confused, but hasn't received pain meds today.   6. Skin/Wound Care: Routine skin care checks   7. Fluids/Electrolytes/Nutrition: Routine Is and Os and follow-up chemistries  -continue vit C, zinc  supplements  8: Hypertension: monitor TID and prn  -continue Lopressor  25 mg BID  -on multiple bladder meds  9: Hyperlipidemia: continue statin  10: ? OAB/? BPH: on Proscar , Toviaz , Myrbetriq  and Flomax   11: GI prophylaxis: continue Protonix   12: Left BKA 1/31 Dr. Harden  -Prevena  off on 2/07  13: DM: CBGs QID, A1c = 10%  -continue moderate scale SSI  -continue Novolog  4 units with meals  -continue Semglee  15 units at bedtime  -continue metformin  500 mg BID with meals  14: History of bicuspid aortic valve, moderate AV stenosis  15: History of CAD s/p LHC with PCI/stent on 07/12/2018  -completed DAPT for 12 months; on statin  -follows with Dr. Court  16: Pancytopenia: follows with Dr. Lanny and gets B12 injection monthly  -follow-up CBC   17. Constipation- LBM before surgery- needs to get cleaned out but didn't want Sorbitol until seen by therapy.   18. Confusion- monitor pain meds, to make sure pt clears.   Sandra J Setzer, PA-C 08/05/2023   I have personally performed a face to face diagnostic evaluation of this patient and formulated the key components of the plan.  Additionally, I have personally reviewed laboratory data, imaging studies, as well as relevant notes and concur with the physician assistant's documentation above.   The patient's status has not changed from the original H&P.  Any changes in documentation from the acute care chart have been noted above.

## 2023-08-05 NOTE — Discharge Summary (Signed)
 Physician Discharge Summary  Patient ID: SALOME COZBY MRN: [id number] DOB/AGE: 08-14-1955 68 y.o.  Admit date: 08/01/2023 Discharge date: 08/05/2023  Admission Diagnoses:  Principal Problem:   S/P BKA (below knee amputation) unilateral, left (HCC) Active Problems:   Ganglion of foot, left   Discharge Diagnoses:  Same  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   Status post transmetatarsal amputation of foot, 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 male who was admitted 08/01/2023 with a chief complaint of No chief complaint on file. , and found to have a diagnosis of S/P BKA (below knee amputation) unilateral, left (HCC).  They were brought to the operating room on 08/01/2023 and underwent the above named procedures.    They were given perioperative antibiotics:  Anti-infectives (From admission, onward)    Start     Dose/Rate Route Frequency Ordered Stop   08/01/23 1800  ceFAZolin  (ANCEF ) IVPB 2g/100 mL premix        2 g 200 mL/hr over 30 Minutes Intravenous Every 8 hours 08/01/23 1614 08/02/23 0224   08/01/23 0815   ceFAZolin  (ANCEF ) IVPB 2g/100 mL premix        2 g 200 mL/hr over 30 Minutes Intravenous On call to O.R. 08/01/23 9196 08/01/23 9048     .  They were given compression stockings, early ambulation, and chemoprophylaxis for DVT prophylaxis.  They benefited maximally from their hospital stay and there were no complications.    Recent vital signs:  Vitals:   08/05/23 0544 08/05/23 0730  BP: (!) 102/53 (!) 98/51  Pulse: 71 71  Resp: 18 17  Temp: 98.7 F (37.1 C) 98.7 F (37.1 C)  SpO2: 96% 96%    Recent laboratory studies:  Results for orders placed or performed during the hospital encounter of 08/01/23  Glucose, capillary   Collection Time: 08/01/23  8:18 AM  Result Value Ref Range   Glucose-Capillary 288 (H) 70 - 99 mg/dL  Comprehensive metabolic panel   Collection Time: 08/01/23  9:23 AM  Result Value Ref Range   Sodium 135 135 - 145 mmol/L   Potassium 3.9 3.5 - 5.1 mmol/L   Chloride 96 (L) 98 - 111 mmol/L   CO2 28 22 - 32 mmol/L   Glucose, Bld 316 (H) 70 - 99 mg/dL   BUN 16 8 - 23 mg/dL   Creatinine, Ser 9.07 0.61 - 1.24 mg/dL   Calcium  9.0 8.9 - 10.3 mg/dL   Total Protein 6.6  6.5 - 8.1 g/dL   Albumin 3.0 (L) 3.5 - 5.0 g/dL   AST 24 15 - 41 U/L   ALT 18 0 - 44 U/L   Alkaline Phosphatase 105 38 - 126 U/L   Total Bilirubin 0.6 0.0 - 1.2 mg/dL   GFR, Estimated >39 >39 mL/min   Anion gap 11 5 - 15  Prealbumin   Collection Time: 08/01/23  9:25 AM  Result Value Ref Range   Prealbumin 8 (L) 18 - 38 mg/dL  Surgical pathology   Collection Time: 08/01/23  9:34 AM  Result Value Ref Range   SURGICAL PATHOLOGY      SURGICAL PATHOLOGY CASE: [id number] PATIENT: Jennifer Fringer Surgical Pathology Report     Clinical History: osteomyelitis left foot (cm)     FINAL MICROSCOPIC DIAGNOSIS:  A.   LEG, LEFT BELOW KNEE, AMPUTATION: -  Skin with hyperkeratosis, dermal fibrosis and chronic inflammation. -  Underlying bone with bony remodeling, negative for  acute osteomyelitis in sections examined. -  Vessels with mild atherosclerosis    GROSS DESCRIPTION:  A.  Left leg below-knee amputation, received fresh is a left leg below-knee amputation with the forefoot surgically absent (15.5 cm from heel to forefoot, 41.5 cm from heel to soft tissue margin, with 2.0 cm of tibia and fibula extending past the soft tissue margin.  There is a 4.0 x 2.5 cm indurated, ulcerated wound on the posterior heel with softening of the underlying bone. The soft tissue and skeletal muscle of the lower leg and foot are severely edematous.  The remaining skin is brown-gray and hyperkeratotic on the foot .  Sectioning reveals moderately atherosclerotic vasculature. The bony and soft tissue margins are edematous but grossly viable. Block summary:  A1: Marrow from tibial margin, following decalcification A2: Bone from heel wound, following decalcification A3: Heel wound A4: Anterior tibial artery cross-sections A5: Posterior tibial artery cross-sections  SMB 08/01/23  Final Diagnosis performed by Mark LeGolvan DO.   Electronically signed 08/04/2023 Technical component performed at [employer], [address], [place], [place] [address].  Professional component performed at [facility], [address], [place], [place] [address].  Immunohistochemistry Technical component (if applicable) was performed at [facility]. [address], [place], [place] [address].   IMMUNOHISTOCHEMISTRY DISCLAIMER (if applicable): Some of these immunohistochemical stains may have been developed and the performance characteri stics determine by [facility]. Some may not have been cleared or approved by the U.S. Food and Drug Administration. The FDA has determined that such clearance or approval is not necessary. This test is used for clinical purposes. It should not be regarded as investigational or  for research. This laboratory is certified under the Clinical Laboratory Improvement Amendments of 1988 (CLIA-88) as qualified to perform high complexity clinical laboratory testing.  The controls stained appropriately.   IHC stains are performed on formalin fixed, paraffin embedded tissue using a 3,3diaminobenzidine (DAB) chromogen and Leica Bond Autostainer System. The staining intensity of the nucleus is score manually and is reported as the percentage of tumor cell nuclei demonstrating specific nuclear staining. The specimens are fixed in 10% Neutral Formalin for at least 6 hours and up to 72hrs. These tests are validated on decalcified tissue. Results should be interpreted with c aution given the possibility of false negative results on decalcified specimens. Antibody Clones are as follows ER-clone [id number], PR-clone 16, Ki67- clone MM1. Some of these immunohistochemical stains may have been developed and the  performance characteristics determined by [facility].   Glucose, capillary   Collection Time: 08/01/23 10:53 AM  Result Value Ref Range   Glucose-Capillary 278 (H) 70 - 99 mg/dL  CBC with Differential/Platelet   Collection Time: 08/01/23  2:56 PM  Result Value Ref Range   WBC 3.5 (L) 4.0 - 10.5 K/uL   RBC 3.59 (L) 4.22 - 5.81 MIL/uL   Hemoglobin 9.8 (L) 13.0 - 17.0 g/dL   HCT 70.4 (L) 60.9 - 47.9 %   MCV 82.2 80.0 - 100.0 fL   MCH 27.3 26.0 - 34.0 pg   MCHC 33.2 30.0 - 36.0 g/dL   RDW 86.0 88.4 - 84.4 %   Platelets 109 (L) 150 - 400 K/uL   nRBC 0.0 0.0 - 0.2 %   Neutrophils Relative % 65 %   Neutro Abs 2.3 1.7 - 7.7 K/uL   Lymphocytes Relative 22 %   Lymphs Abs 0.8 0.7 - 4.0 K/uL   Monocytes Relative 9 %   Monocytes Absolute 0.3 0.1 - 1.0 K/uL   Eosinophils Relative 2 %   Eosinophils Absolute 0.1 0.0 - 0.5 K/uL   Basophils Relative 1 %   Basophils Absolute 0.0 0.0 - 0.1 K/uL   Immature Granulocytes 1 %   Abs Immature Granulocytes 0.02 0.00 - 0.07 K/uL   Hemoglobin A1c   Collection Time: 08/01/23  2:56 PM  Result Value Ref Range   Hgb A1c MFr Bld 10.0 (H) 4.8 - 5.6 %   Mean Plasma Glucose 240.3 mg/dL  Glucose, capillary   Collection Time: 08/01/23  4:47 PM  Result Value Ref Range   Glucose-Capillary 300 (H) 70 - 99 mg/dL  Glucose, capillary   Collection Time: 08/01/23  9:13 PM  Result Value Ref Range   Glucose-Capillary 218 (H) 70 - 99 mg/dL  Glucose, capillary   Collection Time: 08/02/23  6:22 AM  Result Value Ref Range   Glucose-Capillary 272 (H) 70 - 99 mg/dL  Glucose, capillary   Collection Time: 08/02/23  9:27 AM  Result Value Ref Range   Glucose-Capillary 323 (H) 70 - 99 mg/dL  Glucose, capillary   Collection Time: 08/02/23 11:41 AM  Result Value Ref Range   Glucose-Capillary 294 (H) 70 - 99 mg/dL  Glucose, capillary   Collection Time: 08/02/23  6:01 PM  Result Value Ref Range   Glucose-Capillary 80 70 - 99 mg/dL  Glucose, capillary   Collection Time: 08/02/23 10:45 PM  Result Value Ref Range   Glucose-Capillary 256 (H) 70 - 99 mg/dL   Comment 1 Notify RN   Glucose, capillary   Collection Time: 08/03/23  6:24 AM  Result Value Ref Range   Glucose-Capillary 217 (H) 70 - 99 mg/dL   Comment 1 Notify RN   Glucose, capillary   Collection Time: 08/03/23  9:49 AM  Result Value Ref Range   Glucose-Capillary 204 (H) 70 - 99 mg/dL  Glucose, capillary   Collection Time: 08/03/23 11:27 AM  Result Value Ref Range   Glucose-Capillary 159 (H) 70 - 99 mg/dL  Glucose, capillary   Collection Time: 08/03/23  4:39 PM  Result Value Ref Range   Glucose-Capillary 228 (H) 70 - 99 mg/dL  Glucose, capillary   Collection Time: 08/03/23 10:02 PM  Result Value Ref Range   Glucose-Capillary 156 (H) 70 - 99 mg/dL  Glucose, capillary   Collection Time: 08/04/23  6:25 AM  Result Value Ref Range   Glucose-Capillary 169 (H) 70 - 99 mg/dL  Glucose, capillary  Collection Time: 08/04/23 11:49 AM  Result Value Ref Range    Glucose-Capillary 167 (H) 70 - 99 mg/dL  Glucose, capillary   Collection Time: 08/04/23  4:35 PM  Result Value Ref Range   Glucose-Capillary 137 (H) 70 - 99 mg/dL  Glucose, capillary   Collection Time: 08/04/23 10:15 PM  Result Value Ref Range   Glucose-Capillary 93 70 - 99 mg/dL  Glucose, capillary   Collection Time: 08/05/23  6:23 AM  Result Value Ref Range   Glucose-Capillary 216 (H) 70 - 99 mg/dL  Glucose, capillary   Collection Time: 08/05/23 11:17 AM  Result Value Ref Range   Glucose-Capillary 203 (H) 70 - 99 mg/dL   *Note: Due to a large number of results and/or encounters for the requested time period, some results have not been displayed. A complete set of results can be found in Results Review.    Discharge Medications:   Allergies as of 08/05/2023       Reactions   Codeine  Other (See Comments)   Heart attack.   Latex Hives, Itching   Morphine  Other (See Comments)   Patient preference   Propofol  Other (See Comments)   Patient preference        Medication List     STOP taking these medications    doxycycline  100 MG tablet Commonly known as: VIBRA -TABS       TAKE these medications    acetaminophen  500 MG tablet Commonly known as: TYLENOL  Take 1,500 mg by mouth every 6 (six) hours as needed for moderate pain, mild pain or headache.   aspirin  81 MG chewable tablet Chew 81 mg by mouth daily.   CINNAMON PO Take 1,200 mg by mouth 2 (two) times daily. [place]   vitamin B-12 500 MCG tablet Commonly known as: CYANOCOBALAMIN  Take 500 mcg by mouth 2 (two) times daily.   cyanocobalamin  1000 MCG/ML injection Commonly known as: VITAMIN B12 INJECT 1 ML (1,000 MCG TOTAL) INTO THE MUSCLE EVERY 30 DAYS.   famotidine  20 MG tablet Commonly known as: PEPCID  TAKE 1 TABLET (20 MG TOTAL) BY MOUTH 2 (TWO) TIMES DAILY AS NEEDED FOR HEARTBURN OR INDIGESTION. What changed: when to take this   finasteride  5 MG tablet Commonly known as: PROSCAR  Take 5 mg by mouth  daily.   FreeStyle Libre 3 Reader [place] 1 each by Does not apply route as directed. Use with sensor to monitor blood sugar   FreeStyle Libre 3 Sensor Misc 1 each by Does not apply route every 14 (fourteen) days. Place 1 sensor on the skin every 14 days. Use to check glucose continuously   furosemide  20 MG tablet Commonly known as: LASIX  TAKE 1 TABLET BY MOUTH TWICE A DAY What changed: when to take this   Gemtesa 75 MG Tabs Generic drug: Vibegron Take 75 mg by mouth daily.   guaifenesin  400 MG Tabs tablet Commonly known as: HUMIBID E Take 400 mg by mouth 2 (two) times daily.   insulin  aspart 100 UNIT/ML injection Commonly known as: novoLOG  Inject 15 Units into the skin 2 (two) times daily.   Lantus  SoloStar 100 UNIT/ML Solostar Pen Generic drug: insulin  glargine INJECT 40 UNITS INTO THE SKIN DAILY What changed:  how much to take when to take this   metFORMIN  500 MG tablet Commonly known as: GLUCOPHAGE  TAKE 1 TABLET BY MOUTH 2 TIMES DAILY WITH A MEAL.   metoprolol  tartrate 25 MG tablet Commonly known as: LOPRESSOR  Take 25 mg by mouth 2 (two) times daily.  multivitamin with minerals Tabs tablet Take 1 tablet by mouth daily.  iron   OVER THE COUNTER MEDICATION Take 1 tablet by mouth 2 (two) times daily. [place] cinnamon   rosuvastatin  40 MG tablet Commonly known as: CRESTOR  Take 1 tablet (40 mg total) by mouth daily at 6 PM. What changed:  how much to take when to take this   sildenafil  100 MG tablet Commonly known as: VIAGRA  Take 100 mg by mouth daily as needed for erectile dysfunction.   tamsulosin  0.4 MG Caps capsule Commonly known as: FLOMAX  Take 1 capsule (0.4 mg total) by mouth daily.   tolterodine 4 MG 24 hr capsule Commonly known as: DETROL LA Take 4 mg by mouth daily.   Trulicity  0.75 MG/0.5ML [place] Generic drug: Dulaglutide  Inject 0.75 mg into the skin every 30 (thirty) days.        Diagnostic Studies: XR Foot Complete Left Result Date:  07/30/2023 Radiographs of the left foot show probing of the silver  nitrate stick down to the first metatarsal   Disposition: Discharge disposition: 02-Transferred to [facility]       Discharge Instructions     Call MD / Call 911   Complete by: As directed    If you experience chest pain or shortness of breath, CALL 911 and be transported to the hospital emergency room.  If you develope a fever above 101 F, pus (white drainage) or increased drainage or redness at the wound, or calf pain, call your surgeon's office.   Constipation Prevention   Complete by: As directed    Drink plenty of fluids.  Prune juice may be helpful.  You may use a stool softener, such as Colace (over the counter) 100 mg twice a day.  Use MiraLax  (over the counter) for constipation as needed.   Diet - low sodium heart healthy   Complete by: As directed    Increase activity slowly as tolerated   Complete by: As directed    Negative Pressure Wound Therapy - Incisional   Complete by: As directed    Patient may transition from the hospital VAC to the Prevena plus portable wound VAC pump for discharge to inpatient rehab.   Post-operative opioid taper instructions:   Complete by: As directed    POST-OPERATIVE OPIOID TAPER INSTRUCTIONS: It is important to wean off of your opioid medication as soon as possible. If you do not need pain medication after your surgery it is ok to stop day one. Opioids include: Codeine , Hydrocodone (Norco, Vicodin), Oxycodone (Percocet, oxycontin ) and hydromorphone  amongst others.  Long term and even short term use of opiods can cause: Increased pain response Dependence Constipation Depression Respiratory depression And more.  Withdrawal symptoms can include Flu like symptoms Nausea, vomiting And more Techniques to manage these symptoms Hydrate well Eat regular healthy meals Stay active Use relaxation techniques(deep breathing, meditating, yoga) Do Not substitute Alcohol  to help with tapering If you have been on opioids for less than two weeks and do not have pain than it is ok to stop all together.  Plan to wean off of opioids This plan should start within one week post op of your joint replacement. Maintain the same interval or time between taking each dose and first decrease the dose.  Cut the total daily intake of opioids by one tablet each day Next start to increase the time between doses. The last dose that should be eliminated is the evening dose.           Follow-up  Information     Harden Jerona GAILS, MD Follow up in 1 week(s).   Specialty: Orthopedic Surgery Contact information: [address] [place] [address] [phone]                  Signed: Jerona GAILS Harden 08/05/2023, 12:28 PM

## 2023-08-05 NOTE — Progress Notes (Signed)
 Patient ID: Adam Ray, male   DOB: [birth year], 68 y.o.   MRN: [id number] Patient is status post transtibial amputation.  Insurance has approved inpatient rehab.  Will plan for discharge to inpatient rehab when bed is available.  There is no drainage in the wound VAC canister.  Patient's blood glucose has run low while hospitalized secondary to decreased oral intake.  Will decrease his long-acting insulin .

## 2023-08-05 NOTE — H&P (Signed)
 Physical Medicine and Rehabilitation Admission H&P     CC: Functional deficits secondary to bilateral lower extremity amputations- new L BKA   HPI: Adam Ray is a 68 year old diabetic with PAD and HTN who presented for left below amputation on 08/01/2023. He presented with a new ulcer left heel. He was status post a left transmetatarsal amputation. He complained of odor and drainage from the wound for 2 weeks. There was also an ulcer over the transmetatarsal amputation that is 2 cm in diameter with flat healthy granulation tissue no exposed bone or tendon no drainage. Patient denied any specific injury. Prevena wound VAC in place. 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 Wears right lower extremity prosthesis. And uses scooter to get around.     History of DM with A1c of 10% on 08/01/2023, CAD, moderate AS, hypertension, hyperlipidemia, pancytopenia on B12 injections monthly. Patient required Min A +2 to shift hips to EOB, however the patient demonstrated great improvement with bed mobility. The patient requires inpatient medicine and rehabilitation evaluations and services for ongoing dysfunction secondary to bilateral lower extremity amputations.    Awake and alert.Positioned in bed and provided dinner tray. Good appetite. Says pain in leg is bad, but I can stand it.   Said pain yesterday was better today then yesterday. Admits not sure able to call wife- couldn't remember her number.  Said usually has 5 Bms/day but no BM since surgery.    Review of Systems  Constitutional:  Negative for chills and fever.  Respiratory:  Negative for cough and shortness of breath.   Gastrointestinal:  Negative for nausea and vomiting.  Genitourinary:  Positive for urgency. Negative for dysuria.  Neurological:  Negative for dizziness and headaches.  Psychiatric/Behavioral:  Negative for depression. The patient does not have insomnia.   All other systems reviewed and are negative.       Past Medical History:   Diagnosis Date   Allergy     Arthritis     Bronchitis     Cataract     CHF (congestive heart failure) (HCC)     Chronic kidney disease     Claudication (HCC)      right foot ray resection   Colon polyps      hyperplastic   COPD (chronic obstructive pulmonary disease) (HCC)     Coronary artery disease     Diabetes mellitus      type II   Genital warts     Gout     Hyperlipidemia     Hypertension     Myocardial infarction (HCC)     Osteomyelitis of third toe of right foot (HCC)     Pneumonia     Status post amputation of toe of right foot (HCC) 09/24/2016   Status post transmetatarsal amputation of foot, 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/2018   STEMI involving left circumflex coronary artery (HCC) 07/12/2018    Coronary artery disease   Subacute osteomyelitis, right ankle and foot (HCC) 01/29/2016   Testicular mass 04/18/2016             Past Surgical History:  Procedure Laterality Date   AMPUTATION Right 01/31/2016    Procedure: Right 2nd Toe Amputation;  Surgeon: Jerona LULLA Sage, MD;  Location: [facility] OR;  Service: Orthopedics;  Laterality: Right;   AMPUTATION Right 07/08/2018    Procedure: RIGHT TRANSMETATARSAL AMPUTATION;  Surgeon: Sage Jerona LULLA, MD;  Location[facility];  Service: Orthopedics;  Laterality: Right;   AMPUTATION Right 01/05/2020  Procedure: RIGHT BELOW KNEE AMPUTATION;  Surgeon: Harden Jerona GAILS, MD;  Location: [facility] OR;  Service: Orthopedics;  Laterality: Right;   AMPUTATION Right 02/23/2020    Procedure: RIGHT BELOW KNEE AMPUTATION REVISION;  Surgeon: Harden Jerona GAILS, MD;  Location: [facility] OR;  Service: Orthopedics;  Laterality: Right;   AMPUTATION Left 10/11/2022    Procedure: LEFT GREAT TOE AMPUTATION AND LEFT 2ND TOE AMPUTATION;  Surgeon: Harden Jerona GAILS, MD;  Location: [facility] OR;  Service: Orthopedics;  Laterality: Left;   AMPUTATION Left 12/25/2022    Procedure: LEFT TRANSMETATARSAL AMPUTATION;  Surgeon: Harden Jerona GAILS, MD;  Location: [facility] OR;  Service: Orthopedics;  Laterality: Left;    AMPUTATION Left 08/01/2023    Procedure: LEFT BELOW KNEE AMPUTATION;  Surgeon: Harden Jerona GAILS, MD;  Location: [facility] OR;  Service: Orthopedics;  Laterality: Left;   CATARACT EXTRACTION        right eye   COLONOSCOPY       CORONARY STENT INTERVENTION N/A 07/12/2018    Procedure: CORONARY STENT INTERVENTION;  Surgeon: Burnard Debby LABOR, MD;  Location: [facility] INVASIVE CV LAB;  Service: Cardiovascular;  Laterality: N/A;   CORONARY/GRAFT ACUTE MI REVASCULARIZATION N/A 07/12/2018    Procedure: Coronary/Graft Acute MI Revascularization;  Surgeon: Burnard Debby LABOR, MD;  Location: [facility] INVASIVE CV LAB;  Service: Cardiovascular;  Laterality: N/A;   I & D EXTREMITY   04/11/2012    Procedure: IRRIGATION AND DEBRIDEMENT EXTREMITY;  Surgeon: Norleen Armor, MD;  Location: [facility] OR;  Service: Orthopedics;  Laterality: Right;   LEFT HEART CATH AND CORONARY ANGIOGRAPHY N/A 07/12/2018    Procedure: LEFT HEART CATH AND CORONARY ANGIOGRAPHY;  Surgeon: Burnard Debby LABOR, MD;  Location: [facility] INVASIVE CV LAB;  Service: Cardiovascular;  Laterality: N/A;   Surgery left great toe       Tear ducts bilateral eyes       TRANSMETATARSAL AMPUTATION Right 07/08/2018             Family History  Problem Relation Age of Onset   Diabetes Mother     Stroke Mother     Heart failure Father     Heart attack Father     Diabetes Sister          multiple siblings   Diabetes Brother          muliple siblings   Heart disease Brother     Colon cancer Neg Hx     Esophageal cancer Neg Hx     Rectal cancer Neg Hx     Stomach cancer Neg Hx          Social History:  reports that he quit smoking about 4 years ago. His smoking use included cigars and cigarettes. He started smoking about 60 years ago. He has a 16.8 pack-year smoking history. He has been exposed to tobacco smoke. He has quit using smokeless tobacco. He reports that he does not currently use alcohol. He reports that he does not use drugs. Allergies:  Allergies       Allergies  Allergen  Reactions   Codeine  Other (See Comments)      Heart attack.   Latex Hives and Itching   Morphine  Other (See Comments)      Patient preference   Propofol  Other (See Comments)      Patient preference            Medications Prior to Admission  Medication Sig Dispense Refill   aspirin  81 MG chewable tablet Chew 81 mg by  mouth daily.       CINNAMON PO Take 1,200 mg by mouth 2 (two) times daily. [place]       Continuous Glucose Receiver (FREESTYLE LIBRE 3 READER) DEVI 1 each by Does not apply route as directed. Use with sensor to monitor blood sugar 1 each 0   cyanocobalamin  (VITAMIN B12) 1000 MCG/ML injection INJECT 1 ML (1,000 MCG TOTAL) INTO THE MUSCLE EVERY 30 DAYS. 3 mL 2   Dulaglutide  (TRULICITY ) 0.75 MG/0.5ML SOAJ Inject 0.75 mg into the skin every 30 (thirty) days.       famotidine  (PEPCID ) 20 MG tablet TAKE 1 TABLET (20 MG TOTAL) BY MOUTH 2 (TWO) TIMES DAILY AS NEEDED FOR HEARTBURN OR INDIGESTION. (Patient taking differently: Take 20 mg by mouth daily.) 180 tablet 1   finasteride  (PROSCAR ) 5 MG tablet Take 5 mg by mouth daily.       furosemide  (LASIX ) 20 MG tablet TAKE 1 TABLET BY MOUTH TWICE A DAY (Patient taking differently: Take 20 mg by mouth daily.) 180 tablet 3   guaifenesin  (HUMIBID E) 400 MG TABS tablet Take 400 mg by mouth 2 (two) times daily.       insulin  aspart (NOVOLOG ) 100 UNIT/ML injection Inject 15 Units into the skin 2 (two) times daily.       insulin  glargine (LANTUS  SOLOSTAR) 100 UNIT/ML Solostar Pen INJECT 40 UNITS INTO THE SKIN DAILY (Patient taking differently: Inject 30 Units into the skin in the morning.) 45 mL 1   metFORMIN  (GLUCOPHAGE ) 500 MG tablet TAKE 1 TABLET BY MOUTH 2 TIMES DAILY WITH A MEAL. (Patient taking differently: Take 500 mg by mouth 2 (two) times daily with a meal.) 180 tablet 1   metoprolol  tartrate (LOPRESSOR ) 25 MG tablet Take 25 mg by mouth 2 (two) times daily.       Multiple Vitamin (MULTIVITAMIN WITH MINERALS) TABS tablet Take 1 tablet by  mouth daily.  iron       OVER THE COUNTER MEDICATION Take 1 tablet by mouth 2 (two) times daily. [place] cinnamon       rosuvastatin  (CRESTOR ) 40 MG tablet Take 1 tablet (40 mg total) by mouth daily at 6 PM. (Patient taking differently: Take 20 mg by mouth at bedtime.)       tamsulosin  (FLOMAX ) 0.4 MG CAPS capsule Take 1 capsule (0.4 mg total) by mouth daily. 30 capsule 3   Vibegron (GEMTESA) 75 MG TABS Take 75 mg by mouth daily.       vitamin B-12 (CYANOCOBALAMIN ) 500 MCG tablet Take 500 mcg by mouth 2 (two) times daily.       acetaminophen  (TYLENOL ) 500 MG tablet Take 1,500 mg by mouth every 6 (six) hours as needed for moderate pain, mild pain or headache.       Continuous Glucose Sensor (FREESTYLE LIBRE 3 SENSOR) MISC 1 each by Does not apply route every 14 (fourteen) days. Place 1 sensor on the skin every 14 days. Use to check glucose continuously 2 each 11   doxycycline  (VIBRA -TABS) 100 MG tablet Take 1 tablet (100 mg total) by mouth 2 (two) times daily. 60 tablet 0   sildenafil  (VIAGRA ) 100 MG tablet Take 100 mg by mouth daily as needed for erectile dysfunction.       tolterodine (DETROL LA) 4 MG 24 hr capsule Take 4 mg by mouth daily.                  Home: Home Living Family/patient expects to be discharged to:: Private residence  Living Arrangements: Spouse/significant other Available Help at Discharge: Family Type of Home: House Home Access: Ramped entrance Home Layout: One level Bathroom Shower/Tub: Engineer, Manufacturing Systems: Handicapped height Bathroom Accessibility: Yes Home Equipment: Agricultural Consultant (2 wheels), [employer] - single point, Art gallery manager  Lives With: Spouse   Functional History: Prior Function Prior Level of Function : Needs assist Physical Assist : ADLs (physical) ADLs (physical): Feeding, Grooming, Bathing, Dressing, Toileting, IADLs Mobility Comments: Using RW vs cane for household distances, art gallery manager for community distances   Functional  Status:  Mobility: Bed Mobility Overal bed mobility: Modified Independent Bed Mobility: Sit to Supine, Supine to Sit Rolling: Mod assist, Used rails Supine to sit: Min assist, HOB elevated, Used rails Sit to supine: Min assist General bed mobility comments: Patient demonstrates improved trunk control needed to transition EOB. Required Min A +1 to shift hips EOB Transfers Overall transfer level: Needs assistance Equipment used: Sliding board Transfers: Bed to chair/wheelchair/BSC Sit to Stand: +2 safety/equipment, +2 physical assistance, Max assist Bed to/from chair/wheelchair/BSC transfer type:: Lateral/scoot transfer Squat pivot transfers: Max assist, +2 physical assistance  Lateral/Scoot Transfers: Max assist, +2 physical assistance General transfer comment: Patient unable to use bilateral LE support to assist with transfer due to LLE NWB and RLE prosthesis is Interior And Spatial Designer mobility: Yes Wheelchair propulsion: Both upper extremities Wheelchair parts: Supervision/cueing Distance: 81 Wheelchair Assistance Details (indicate cue type and reason): Patient educated on w/c parts, safely propells himself within the unit   ADL: ADL Overall ADL's : Needs assistance/impaired Eating/Feeding: Independent, Sitting Eating/Feeding Details (indicate cue type and reason): supported sitting Grooming: Contact guard assist, Sitting Upper Body Bathing: Sitting, Contact guard assist Lower Body Bathing: Minimal assistance, Bed level Upper Body Dressing : Bed level, Minimal assistance Lower Body Dressing: Maximal assistance, Sitting/lateral leans Lower Body Dressing Details (indicate cue type and reason): doff prosthetic sitting EOB Toilet Transfer: Maximal assistance, +2 for physical assistance, +2 for safety/equipment Toilet Transfer Details (indicate cue type and reason): sim with recliner to bed t/f Toileting- Clothing Manipulation and Hygiene: Maximal assistance, +2 for  safety/equipment Functional mobility during ADLs: +2 for safety/equipment, +2 for physical assistance, Maximal assistance (pivot transfer) General ADL Comments: Pt started urinating in bed while OT and MS were helping with repositioning. He report he can tell when he needs to go but did not alert anyone that he was about to actively begin urinating   Cognition: Cognition Overall Cognitive Status: Within Functional Limits for tasks assessed Orientation Level: Oriented X4 Cognition Arousal: Alert Behavior During Therapy: WFL for tasks assessed/performed Overall Cognitive Status: Within Functional Limits for tasks assessed   Physical Exam: Blood pressure (!) 98/51, pulse 71, temperature 98.7 F (37.1 C), temperature source Oral, resp. rate 17, height 5' 10 (1.778 m), weight 77.1 kg, SpO2 96%. Physical Exam Vitals and nursing note reviewed.  Constitutional:      General: He is not in acute distress.    Comments: Confused- not able to dial wife's number on phone, sitting up in bed- scooter at bedside- not wearing R prosthesis; NAD  HENT:     Head: Normocephalic and atraumatic.     Comments: No teeth    Right Ear: External ear normal.     Left Ear: External ear normal.     Nose: Nose normal. No congestion.     Mouth/Throat:     Mouth: Mucous membranes are dry.     Pharynx: Oropharynx is clear. No oropharyngeal exudate.  Eyes:     General:  Right eye: No discharge.        Left eye: No discharge.     Extraocular Movements: Extraocular movements intact.  Cardiovascular:     Rate and Rhythm: Normal rate and regular rhythm.     Heart sounds: Normal heart sounds. No murmur heard.    No gallop.  Pulmonary:     Effort: Pulmonary effort is normal. No respiratory distress.     Breath sounds: Normal breath sounds. No wheezing, rhonchi or rales.  Abdominal:     Palpations: Abdomen is soft.     Comments: Soft, but distended- hypoactive BS  Musculoskeletal:     Cervical back: Neck  supple. No tenderness.     Comments: Left prevena dressing in place with good seal and no output. Black stocking in place. R BKA healed- prosthesis in room Ue's 5/5 and RLE 5/5 ending in BKA LLE- at least 3/5 - has L BKA  Skin:    General: Skin is warm and dry.     Comments: VAC in place on LLE- no drainage in cannister  Neurological:     General: No focal deficit present.     Mental Status: He is alert and oriented to person, place, and time.  Psychiatric:        Mood and Affect: Mood normal.        Thought Content: Thought content normal.        Lab Results Last 48 Hours        Results for orders placed or performed during the hospital encounter of 08/01/23 (from the past 48 hours)  Glucose, capillary     Status: Abnormal    Collection Time: 08/03/23 11:27 AM  Result Value Ref Range    Glucose-Capillary 159 (H) 70 - 99 mg/dL      Comment: Glucose reference range applies only to samples taken after fasting for at least 8 hours.  Glucose, capillary     Status: Abnormal    Collection Time: 08/03/23  4:39 PM  Result Value Ref Range    Glucose-Capillary 228 (H) 70 - 99 mg/dL      Comment: Glucose reference range applies only to samples taken after fasting for at least 8 hours.  Glucose, capillary     Status: Abnormal    Collection Time: 08/03/23 10:02 PM  Result Value Ref Range    Glucose-Capillary 156 (H) 70 - 99 mg/dL      Comment: Glucose reference range applies only to samples taken after fasting for at least 8 hours.  Glucose, capillary     Status: Abnormal    Collection Time: 08/04/23  6:25 AM  Result Value Ref Range    Glucose-Capillary 169 (H) 70 - 99 mg/dL      Comment: Glucose reference range applies only to samples taken after fasting for at least 8 hours.  Glucose, capillary     Status: Abnormal    Collection Time: 08/04/23 11:49 AM  Result Value Ref Range    Glucose-Capillary 167 (H) 70 - 99 mg/dL      Comment: Glucose reference range applies only to samples  taken after fasting for at least 8 hours.  Glucose, capillary     Status: Abnormal    Collection Time: 08/04/23  4:35 PM  Result Value Ref Range    Glucose-Capillary 137 (H) 70 - 99 mg/dL      Comment: Glucose reference range applies only to samples taken after fasting for at least 8 hours.  Glucose, capillary  Status: None    Collection Time: 08/04/23 10:15 PM  Result Value Ref Range    Glucose-Capillary 93 70 - 99 mg/dL      Comment: Glucose reference range applies only to samples taken after fasting for at least 8 hours.  Glucose, capillary     Status: Abnormal    Collection Time: 08/05/23  6:23 AM  Result Value Ref Range    Glucose-Capillary 216 (H) 70 - 99 mg/dL      Comment: Glucose reference range applies only to samples taken after fasting for at least 8 hours.    *Note: Due to a large number of results and/or encounters for the requested time period, some results have not been displayed. A complete set of results can be found in Results Review.      Imaging Results (Last 48 hours)  No results found.         Blood pressure (!) 98/51, pulse 71, temperature 98.7 F (37.1 C), temperature source Oral, resp. rate 17, height 5' 10 (1.778 m), weight 77.1 kg, SpO2 96%.   Medical Problem List and Plan: 1. Functional deficits secondary to L BKA in setting of R BKA             -patient may not  shower while VAC in place             -ELOS/Goals: 10-12 days at w/c level   2.  Antithrombotics: This patient has no DVT prophlaxis- -DVT/anticoagulation:                -antiplatelet therapy: none   3. Pain Management: Tylenol , oxycodone  as needed   4. Mood/Behavior/Sleep: LCSW to evaluate and provide emotional support             -antipsychotic agents: n/a   5. Neuropsych/cognition: currently, patient is not  capable of making decisions on his  own behalf. Confused, but hasn't received pain meds today.    6. Skin/Wound Care: Routine skin care checks   7.  Fluids/Electrolytes/Nutrition: Routine Is and Os and follow-up chemistries             -continue vit C, zinc  supplements   8: Hypertension: monitor TID and prn             -continue Lopressor  25 mg BID             -on multiple bladder meds   9: Hyperlipidemia: continue statin   10: ? OAB/? 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 Harden             -Prevena off on 2/07   13: DM: CBGs QID, A1c = 10%             -continue moderate scale SSI             -continue Novolog  4 units with meals             -continue Semglee  15 units at bedtime             -continue metformin  500 mg BID with meals   14: History of bicuspid aortic valve, moderate AV stenosis   15: History of CAD s/p LHC with PCI/stent on 07/12/2018             -completed DAPT for 12 months; on statin             -follows with Dr. Court  16: Pancytopenia: follows with Dr. Lanny and gets B12 injection monthly             -follow-up CBC     17. Constipation- LBM before surgery- needs to get cleaned out but didn't want Sorbitol until seen by therapy.    18. Confusion- monitor pain meds, to make sure pt clears.    Sandra J Setzer, PA-C 08/05/2023     I have personally performed a face to face diagnostic evaluation of this patient and formulated the key components of the plan.  Additionally, I have personally reviewed laboratory data, imaging studies, as well as relevant notes and concur with the physician assistant's documentation above.   The patient's status has not changed from the original H&P.  Any changes in documentation from the acute care chart have been noted above.

## 2023-08-05 NOTE — Progress Notes (Signed)
 This nurse spoke with patient about pain medication. Patient stated that oxycodone  and hydrocodone  cause him to have hallucinations and does not want to have these on his medication list. Patient states he prefers only tylenol  and ibuprofen  for pain relief. Nurse educated patient since his pain is currently 10/10 then the tylenol  would not relieve his pain completely. Patient understands and would not like any other pain medication. Nurse advised on call PA [facility] about patient's decision.

## 2023-08-05 NOTE — Plan of Care (Signed)

## 2023-08-06 DIAGNOSIS — Z89512 Acquired absence of left leg below knee: Secondary | ICD-10-CM | POA: Diagnosis not present

## 2023-08-06 LAB — CBC WITH DIFFERENTIAL/PLATELET
Abs Immature Granulocytes: 0.06 10*3/uL (ref 0.00–0.07)
Basophils Absolute: 0 10*3/uL (ref 0.0–0.1)
Basophils Relative: 1 %
Eosinophils Absolute: 0.2 10*3/uL (ref 0.0–0.5)
Eosinophils Relative: 4 %
HCT: 24.6 % — ABNORMAL LOW (ref 39.0–52.0)
Hemoglobin: 8.3 g/dL — ABNORMAL LOW (ref 13.0–17.0)
Immature Granulocytes: 1 %
Lymphocytes Relative: 20 %
Lymphs Abs: 0.9 10*3/uL (ref 0.7–4.0)
MCH: 27.7 pg (ref 26.0–34.0)
MCHC: 33.7 g/dL (ref 30.0–36.0)
MCV: 82 fL (ref 80.0–100.0)
Monocytes Absolute: 0.5 10*3/uL (ref 0.1–1.0)
Monocytes Relative: 10 %
Neutro Abs: 3.1 10*3/uL (ref 1.7–7.7)
Neutrophils Relative %: 64 %
Platelets: 165 10*3/uL (ref 150–400)
RBC: 3 MIL/uL — ABNORMAL LOW (ref 4.22–5.81)
RDW: 13.6 % (ref 11.5–15.5)
WBC: 4.8 10*3/uL (ref 4.0–10.5)
nRBC: 0 % (ref 0.0–0.2)

## 2023-08-06 LAB — GLUCOSE, CAPILLARY
Glucose-Capillary: 214 mg/dL — ABNORMAL HIGH (ref 70–99)
Glucose-Capillary: 249 mg/dL — ABNORMAL HIGH (ref 70–99)
Glucose-Capillary: 208 mg/dL — ABNORMAL HIGH (ref 70–99)
Glucose-Capillary: 217 mg/dL — ABNORMAL HIGH (ref 70–99)

## 2023-08-06 LAB — COMPREHENSIVE METABOLIC PANEL
ALT: 42 U/L (ref 0–44)
AST: 48 U/L — ABNORMAL HIGH (ref 15–41)
Albumin: 2.2 g/dL — ABNORMAL LOW (ref 3.5–5.0)
Alkaline Phosphatase: 110 U/L (ref 38–126)
Anion gap: 10 (ref 5–15)
BUN: 20 mg/dL (ref 8–23)
CO2: 27 mmol/L (ref 22–32)
Calcium: 8.6 mg/dL — ABNORMAL LOW (ref 8.9–10.3)
Chloride: 95 mmol/L — ABNORMAL LOW (ref 98–111)
Creatinine, Ser: 0.92 mg/dL (ref 0.61–1.24)
GFR, Estimated: >60 mL/min (ref >60)
Glucose, Bld: 272 mg/dL — ABNORMAL HIGH (ref 70–99)
Potassium: 4.1 mmol/L (ref 3.5–5.1)
Sodium: 132 mmol/L — ABNORMAL LOW (ref 135–145)
Total Bilirubin: 0.4 mg/dL (ref 0.0–1.2)
Total Protein: 5.8 g/dL — ABNORMAL LOW (ref 6.5–8.1)

## 2023-08-06 MED ORDER — VITAMIN D 25 MCG (1000 UNIT) PO TABS
1000.0000 [IU] | ORAL_TABLET | Freq: Every day | ORAL | Status: DC
Start: 2023-08-06 — End: 2023-08-23
  Administered 2023-08-06 – 2023-08-23 (×18): 1000 [IU] via ORAL
  Filled 2023-08-06 (×18): qty 1

## 2023-08-06 MED ORDER — CYANOCOBALAMIN 1000 MCG/ML IJ SOLN
1000.0000 ug | Freq: Once | INTRAMUSCULAR | Status: AC
Start: 2023-08-06 — End: 2023-08-06
  Administered 2023-08-06: 1000 ug via INTRAMUSCULAR
  Filled 2023-08-06: qty 1

## 2023-08-06 MED ORDER — METOPROLOL SUCCINATE ER 25 MG PO TB24
25.0000 mg | ORAL_TABLET | Freq: Every day | ORAL | Status: DC
  Administered 2023-08-06: 25 mg via ORAL
  Filled 2023-08-06: qty 1

## 2023-08-06 MED ORDER — ASPIRIN 81 MG PO CHEW
81.0000 mg | CHEWABLE_TABLET | Freq: Every day | ORAL | Status: DC
  Administered 2023-08-06 – 2023-08-23 (×18): 81 mg via ORAL
  Filled 2023-08-06 (×18): qty 1

## 2023-08-06 MED ORDER — ENOXAPARIN SODIUM 30 MG/0.3ML IJ SOSY
30.0000 mg | PREFILLED_SYRINGE | INTRAMUSCULAR | Status: DC
  Administered 2023-08-06 – 2023-08-10 (×5): 30 mg via SUBCUTANEOUS
  Filled 2023-08-06 (×5): qty 0.3

## 2023-08-06 NOTE — Progress Notes (Signed)
 Physical Therapy Session Note  Patient Details  Name: Adam Ray MRN: [id number] Date of Birth: Nov 22, 1955  Today's Date: 08/06/2023 PT Individual Time: 8598-8542 PT Individual Time Calculation (min): 56 min   Short Term Goals: Week 1:  PT Short Term Goal 1 (Week 1): pt will perform bed mobility with min A PT Short Term Goal 2 (Week 1): pt will transfer bed<>chair with LRAD and mod A PT Short Term Goal 3 (Week 1): pt will transfer sit<>stand with LRAD and mod A  Skilled Therapeutic Interventions/Progress Updates:   Received pt sitting in WC, pt agreeable to PT treatment, and did not c/o pain during session. Session with emphasis on functional mobility/transfers, generalized strengthening and endurance, dynamic standing balance/coordination, and limb loss education.   Pt transported to/from room in [facility] dependently for time management purposes. Transferred onto mat via lateral scoot to R with CGA, increased time, and assist to stabilize WC. Removed limb guard and pt performed the following exercises with emphasis on contracture prevention, LE strength, and ROM: -L knee extension 2x15  -L hip flexion 2x15 -tricep extensions on yoga blocks 2x10 Noted skin tear on L lateral thigh near limb guard - RN notified and foam dressing applied to area. Donned limb guard with max A and increased time spent adjusting prosthetic for proper alignment (pt able to don/doff prosthetic without assist this afternoon). Pt required multiple attempts initially, but able to stand from elevated EOM with RW and max A on trial 1 fading to mod A on remaining 4 trials with BUE support on RW. Pt transferred mat<>WC to L via lateral scoot with CGA and returned to room. Pt requested to return to bed and transferred WC<>bed via lateral scoot with min A. Removed prosthetic and transitioned into supine with CGA and increased time. Concluded session with pt semi-reclined in bed, needs within reach, and bed alarm on.   Therapy  Documentation Precautions:  Restrictions Weight Bearing Restrictions Per Provider Order: Yes LLE Weight Bearing Per Provider Order: Non weight bearing  Therapy/Group: Individual Therapy Therisa HERO Zaunegger Therisa Stains PT, DPT 08/06/2023, 7:14 AM

## 2023-08-06 NOTE — Plan of Care (Signed)
  Problem: RH Balance Goal: LTG: Patient will maintain dynamic sitting balance (OT) Description: LTG:  Patient will maintain dynamic sitting balance with assistance during activities of daily living (OT) Flowsheets (Taken 08/06/2023 1207) LTG: Pt will maintain dynamic sitting balance during ADLs with: Supervision/Verbal cueing Goal: LTG Patient will maintain dynamic standing with ADLs (OT) Description: LTG:  Patient will maintain dynamic standing balance with assist during activities of daily living (OT)  Flowsheets (Taken 08/06/2023 1207) LTG: Pt will maintain dynamic standing balance during ADLs with: Contact Guard/Touching assist   Problem: Sit to Stand Goal: LTG:  Patient will perform sit to stand in prep for activites of daily living with assistance level (OT) Description: LTG:  Patient will perform sit to stand in prep for activites of daily living with assistance level (OT) Flowsheets (Taken 08/06/2023 1207) LTG: PT will perform sit to stand in prep for activites of daily living with assistance level: Contact Guard/Touching assist   Problem: RH Eating Goal: LTG Patient will perform eating w/assist, cues/equip (OT) Description: LTG: Patient will perform eating with assist, with/without cues using equipment (OT) Flowsheets (Taken 08/06/2023 1207) LTG: Pt will perform eating with assistance level of: Set up assist    Problem: RH Grooming Goal: LTG Patient will perform grooming w/assist,cues/equip (OT) Description: LTG: Patient will perform grooming with assist, with/without cues using equipment (OT) Flowsheets (Taken 08/06/2023 1207) LTG: Pt will perform grooming with assistance level of: Set up assist    Problem: RH Bathing Goal: LTG Patient will bathe all body parts with assist levels (OT) Description: LTG: Patient will bathe all body parts with assist levels (OT) Flowsheets (Taken 08/06/2023 1207) LTG: Pt will perform bathing with assistance level/cueing: Minimal Assistance - Patient >  75%   Problem: RH Dressing Goal: LTG Patient will perform upper body dressing (OT) Description: LTG Patient will perform upper body dressing with assist, with/without cues (OT). Flowsheets (Taken 08/06/2023 1207) LTG: Pt will perform upper body dressing with assistance level of: Set up assist Goal: LTG Patient will perform lower body dressing w/assist (OT) Description: LTG: Patient will perform lower body dressing with assist, with/without cues in positioning using equipment (OT) Flowsheets (Taken 08/06/2023 1207) LTG: Pt will perform lower body dressing with assistance level of: Minimal Assistance - Patient > 75%   Problem: RH Toileting Goal: LTG Patient will perform toileting task (3/3 steps) with assistance level (OT) Description: LTG: Patient will perform toileting task (3/3 steps) with assistance level (OT)  Flowsheets (Taken 08/06/2023 1207) LTG: Pt will perform toileting task (3/3 steps) with assistance level: Minimal Assistance - Patient > 75%   Problem: RH Toilet Transfers Goal: LTG Patient will perform toilet transfers w/assist (OT) Description: LTG: Patient will perform toilet transfers with assist, with/without cues using equipment (OT) Flowsheets (Taken 08/06/2023 1207) LTG: Pt will perform toilet transfers with assistance level of: Contact Guard/Touching assist   Problem: RH Awareness Goal: LTG: Patient will demonstrate awareness during functional activites type of (OT) Description: LTG: Patient will demonstrate awareness during functional activites type of (OT) Flowsheets (Taken 08/06/2023 1207) Patient will demonstrate awareness during functional activites type of: Anticipatory LTG: Patient will demonstrate awareness during functional activites type of (OT): Supervision

## 2023-08-06 NOTE — Progress Notes (Signed)
 Inpatient Rehabilitation  Patient information reviewed and entered into [facility] system by Burnard Mealing, OTR/L, Rehab Quality Coordinator.   Information including medical coding, functional ability and quality indicators will be reviewed and updated through discharge.

## 2023-08-06 NOTE — Progress Notes (Addendum)
 PROGRESS NOTE   Subjective/Complaints: No new complaints this morning Therapy notes reviewed and he has not been ambulating, will add lovenox  30mg  q12H  Objective:   No results found. Recent Labs    08/06/23 0517  WBC 4.8  HGB 8.3*  HCT 24.6*  PLT 165   Recent Labs    08/06/23 0517  NA 132*  K 4.1  CL 95*  CO2 27  GLUCOSE 272*  BUN 20  CREATININE 0.92  CALCIUM  8.6*   No intake or output data in the 24 hours ending 08/06/23 1030      Physical Exam: Vital Signs Blood pressure (!) 120/59, pulse 70, temperature 98.2 F (36.8 C), resp. rate 18, height 5' 10 (1.778 m), weight 69.9 kg, SpO2 98%. Constitutional:      General: He is not in acute distress.    Comments: Confused- not able to dial wife's number on phone, sitting up in bed- scooter at bedside- not wearing R prosthesis; NAD  HENT:     Head: Normocephalic and atraumatic.     Comments: No teeth    Right Ear: External ear normal.     Left Ear: External ear normal.     Nose: Nose normal. No congestion.     Mouth/Throat:     Mouth: Mucous membranes are dry.     Pharynx: Oropharynx is clear. No oropharyngeal exudate.  Eyes:     General:        Right eye: No discharge.        Left eye: No discharge.     Extraocular Movements: Extraocular movements intact.  Cardiovascular:     Rate and Rhythm: Normal rate and regular rhythm.     Heart sounds: Normal heart sounds. No murmur heard.    No gallop.  Pulmonary:     Effort: Pulmonary effort is normal. No respiratory distress.     Breath sounds: Normal breath sounds. No wheezing, rhonchi or rales.  Abdominal:     Palpations: Abdomen is soft.     Comments: Soft, but distended- hypoactive BS  Musculoskeletal:     Cervical back: Neck supple. No tenderness.     Comments: Left prevena dressing in place with good seal and no output. Black stocking in place. R BKA healed- prosthesis in room Ue's 5/5 and RLE 5/5  ending in BKA LLE- at least 3/5 - has L BKA  Skin:    General: Skin is warm and dry.     Comments: VAC in place on LLE- no drainage in cannister, stable 2/5 Neurological:     General: No focal deficit present.     Mental Status: He is alert and oriented to person, place, and time.  Psychiatric:        Mood and Affect: Mood normal.        Thought Content: Thought content normal.   Assessment/Plan: 1. Functional deficits which require 3+ hours per day of interdisciplinary therapy in a comprehensive inpatient rehab setting. Physiatrist is providing close team supervision and 24 hour management of active medical problems listed below. Physiatrist and rehab team continue to assess barriers to discharge/monitor patient progress toward functional and medical goals  Care Tool:  Bathing              Bathing assist       Upper Body Dressing/Undressing Upper body dressing        Upper body assist      Lower Body Dressing/Undressing Lower body dressing            Lower body assist       Toileting Toileting    Toileting assist Assist for toileting: Total Assistance - Patient < 25%     Transfers Chair/bed transfer  Transfers assist     Chair/bed transfer assist level: Maximal Assistance - Patient 25 - 49% (SB)     Locomotion Ambulation   Ambulation assist   Ambulation activity did not occur: Safety/medical concerns (pain, decreased balance on R prosthetic)          Walk 10 feet activity   Assist  Walk 10 feet activity did not occur: Safety/medical concerns (pain, decreased balance on R prosthetic)        Walk 50 feet activity   Assist Walk 50 feet with 2 turns activity did not occur: Safety/medical concerns (pain, decreased balance on R prosthetic)         Walk 150 feet activity   Assist Walk 150 feet activity did not occur: Safety/medical concerns (pain, decreased balance on R prosthetic)         Walk 10 feet on uneven surface   activity   Assist Walk 10 feet on uneven surfaces activity did not occur: Safety/medical concerns (pain, decreased balance on R prosthetic)         Wheelchair     Assist Is the patient using a wheelchair?: Yes Type of Wheelchair: Manual    Wheelchair assist level: Supervision/Verbal cueing Max wheelchair distance: 39ft    Wheelchair 50 feet with 2 turns activity    Assist        Assist Level: Moderate Assistance - Patient 50 - 74%   Wheelchair 150 feet activity     Assist      Assist Level: Maximal Assistance - Patient 25 - 49%   Blood pressure (!) 120/59, pulse 70, temperature 98.2 F (36.8 C), resp. rate 18, height 5' 10 (1.778 m), weight 69.9 kg, SpO2 98%.  Medical Problem List and Plan: 1. 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 Nonambulatory: 30mg  Lovenox  daily started   3. Pain Management: Tylenol , oxycodone  as needed   4. Mood/Behavior/Sleep: LCSW to evaluate and provide emotional support             -antipsychotic agents: n/a   5. Neuropsych/cognition: currently, patient is not  capable of making decisions on his  own behalf. Confused, but hasn't received pain meds today.    6. Skin/Wound Care: Routine skin care checks   7. Fluids/Electrolytes/Nutrition: Routine Is and Os and follow-up chemistries             -continue vit C, zinc  supplements   8: Hypertension: monitor TID and prn             -continue Lopressor  25 mg BID             -on multiple bladder meds   9: Hyperlipidemia: continue statin  10: ? OAB/? 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 Court   16: Pancytopenia: follows with Dr. Lanny and gets B12 injection monthly             -follow-up CBC     17. Constipation- LBM before surgery- needs to get cleaned out but didn't want Sorbitol until seen by therapy.    18. Confusion: d/c oxycodone   19, Hypotension: changed lopressor  to Toprol  XL 25mg  HS  20. Fatigue: B12 ordered IM on 2/5  LOS: 1 days A FACE TO FACE EVALUATION WAS PERFORMED  Rudine Rieger P Shahzain Kiester 08/06/2023, 10:30 AM

## 2023-08-06 NOTE — Discharge Summary (Signed)
 Physician Discharge Summary  Patient ID: Adam Ray MRN: [id number] DOB/AGE: 1956-03-29 68 y.o.  Admit date: 08/05/2023 Discharge date: 08/23/2023  Discharge Diagnoses:  Principal Problem:   S/P BKA (below knee amputation) unilateral, left (HCC) Active Problems:   S/P BKA (below knee amputation) (HCC) Hypertension Hyperlipidemia Diabetes mellitus History of moderate aortic valve stenosis, bicuspid aortic valve History of CAD History of pancytopenia Constipation  Discharged Condition: stable  Significant Diagnostic Studies:  Lower Venous DVT Study   Patient Name:  Adam Ray  Date of Exam:   08/13/2023  Medical Rec #: [id number]     Accession #:    [id number]  Date of Birth: [birth year]     Patient Gender: M  Patient Age:   68 years  Exam Location:  [facility]  Procedure:      VAS [place] LOWER EXTREMITY VENOUS (DVT)  Referring Phys: Sula Soda   ---------------------------------------------------------------------------  -----    Indications: Pain, and Swelling.    Risk Factors: Surgery left below knee amputation 08/01/23; right below knee  amputation 02/23/20.  Comparison Study: 03/31/23 - Negative   Performing Technologist: [place] Sink Sturdiv[place] RDMS, RVT     Examination Guidelines:  A complete evaluation includes B-mode imaging, spectral Doppler, color  Doppler,  and power Doppler as needed of all accessible portions of each vessel.  Bilateral  testing is considered an integral part of a complete examination. Limited  examinations for reoccurring indications may be performed as noted. The  reflux  portion of the exam is performed with the patient in reverse  Trendelenburg.    +---------+---------------+---------+-----------+----------+--------------+   RIGHT   CompressibilityPhasicitySpontaneityPropertiesThrombus  Aging  +---------+---------------+---------+-----------+----------+--------------+   CFV     Full           Yes      Yes                                    +---------+---------------+---------+-----------+----------+--------------+   SFJ     Full                                                          +---------+---------------+---------+-----------+----------+--------------+   FV Prox  Full                                                          +---------+---------------+---------+-----------+----------+--------------+   FV Mid   Full                                                          +---------+---------------+---------+-----------+----------+--------------+   FV DistalFull           Yes      Yes                                   +---------+---------------+---------+-----------+----------+--------------+   PFV  Full                                                          +---------+---------------+---------+-----------+----------+--------------+   POP     Full                                                          +---------+---------------+---------+-----------+----------+--------------+   PTV                                                  BKA              +---------+---------------+---------+-----------+----------+--------------+     +---------+---------------+---------+-----------+----------+--------------+   LEFT    CompressibilityPhasicitySpontaneityPropertiesThrombus  Aging  +---------+---------------+---------+-----------+----------+--------------+   CFV     Full           Yes      Yes                                   +---------+---------------+---------+-----------+----------+--------------+   SFJ     Full                                                          +---------+---------------+---------+-----------+----------+--------------+   FV Prox  Full                                                          +---------+---------------+---------+-----------+----------+--------------+    FV Mid   Full                                                          +---------+---------------+---------+-----------+----------+--------------+   FV DistalFull           Yes      Yes                                   +---------+---------------+---------+-----------+----------+--------------+   PFV     Full                                                          +---------+---------------+---------+-----------+----------+--------------+   POP     Full                                                          +---------+---------------+---------+-----------+----------+--------------+  PTV                                                  BKA              +---------+---------------+---------+-----------+----------+--------------+   Summary:  BILATERAL:  - No evidence of deep vein thrombosis seen in the lower extremities,  bilaterally.  -   See table(s) above for measurements and observations.   Electronically signed by Heath Lark on 08/14/2023 at 4:02:55 PM.     Final     Labs:  Basic Metabolic Panel: No results for input(s): "NA", "K", "CL", "CO2", "GLUCOSE", "BUN", "CREATININE", "CALCIUM", "MG", "PHOS" in the last 168 hours.   CBC: Recent Labs  Lab 08/19/23 0526  WBC 4.2  NEUTROABS 1.9  HGB 8.6*  HCT 27.9*  MCV 86.4  PLT 223    CBG: Recent Labs  Lab 08/21/23 1634 08/21/23 2109 08/22/23 0606 08/22/23 2150 08/23/23 0550  GLUCAP 163* 185* 143* 192* 113*    Brief HPI:   Adam Ray is a 68 y.o. male with PAD and HTN who presented for left below amputation on 08/01/2023. He presented with a new ulcer left heel. He was status post a left transmetatarsal amputation. He complained of odor and drainage from the wound for 2 weeks. There was also an ulcer over the transmetatarsal amputation that is 2 cm in diameter with flat healthy granulation tissue no exposed bone or tendon no drainage. Patient denied any specific injury.  Prevena wound VAC in place. 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 Wears right lower extremity prosthesis. And uses scooter to get around.     History of DM with A1c of 10% on 08/01/2023, CAD, moderate AS, hypertension, hyperlipidemia, pancytopenia on B12 injections monthly. Patient required Min A +2 to shift hips to EOB, however the patient demonstrated great improvement with bed mobility. The patient requires inpatient medicine and rehabilitation evaluations and services for ongoing dysfunction secondary to bilateral lower extremity amputations.   Hospital Course: Adam Ray was admitted to rehab 08/05/2023 for inpatient therapies to consist of PT, ST and OT at least three hours five days a week. Past admission physiatrist, therapy team and rehab RN have worked together to provide customized collaborative inpatient rehab. Lovenox 30 mg BID started 2/05 due to immobility. IM B12 ordered times one dose. Incisional vacuum dressing removed 2/07, shrinker applied. Bleeding noted from incision on 2/10 and Lovenox discontinued. No further bleeding. Seen by Dr. Lajoyce Corners on 2/12: Examination of the left transtibial amputation shows some mild ischemic changes along the incision line. There is no wound dehiscence. Patient has some small amount of drainage from resolving hematoma. Recommend continue with Dial soap cleansing daily dry dressing changes and wear the stump shrinker.  LE venous duplex negative for DVT. Ischemic changes to skin edges noted and documented and Dr. Lajoyce Corners aware. Compressive dressing applied due to oozing. No further medical interventions warranted at this time. Patient appealed discharge on 2/19 and was ultimately denied.  Blood pressures were monitored on TID basis and Lopressor 25 mg twice daily continued. Changed beta blocker to Toprol XL 25 mg q HS due to hypotension and decreased dose to 12.5 mg 2/06.  Diabetes has been monitored with ac/hs CBG checks and SSI was use prn for tighter BS control.   NovoLog 4 units with meals and  Semglee 15 units at bedtime continued.  Metformin 500 mg twice daily continued. Increased Semglee to 20 units at bedtime on 2/08. Semglee increased to 22 units and metformin to 850 mg BID.   Rehab course: During patient's stay in rehab weekly team conferences were held to monitor patient's progress, set goals and discuss barriers to discharge. At admission, patient required max with mobility and max-total A with basic self-care skills   He has had improvement in activity tolerance, balance, postural control as well as ability to compensate for deficits. He has had improvement in functional use RUE/LUE  and RLE/LLE as well as improvement in awareness. Patient has met 8 of 9 long term goals due to improved activity tolerance, improved balance, improved postural control, increased strength, increased range of motion, decreased pain, ability to compensate for deficits, improved awareness, and improved coordination. Patient to discharge at a wheelchair level Supervision. Patient's care partner is independent to provide the necessary physical assistance at discharge. Pt's wife attended family education training and verbalized and demonstrated confidence with tasks to ensure safe discharge home.     PT, OT and SNA arranged with [facility]  Discharge disposition: 06-Home-Health Care Svc     Diet: carb modified  Special Instructions: No driving, alcohol consumption or tobacco use.  Recommend referral to ophthalmology to assess for diabetic retinopathy.  Allergies as of 08/23/2023       Reactions   Codeine Other (See Comments)   Heart attack.   Hydrocodone Other (See Comments)   hallucinations   Latex Hives, Itching   Oxycodone Other (See Comments)   hallucinations   Morphine Other (See Comments)   Patient preference   Propofol Other (See Comments)   Patient preference        Medication List     STOP taking these medications    furosemide 20 MG  tablet Commonly known as: LASIX   guaifenesin 400 MG Tabs tablet Commonly known as: HUMIBID E   insulin aspart 100 UNIT/ML injection Commonly known as: novoLOG   metoprolol tartrate 25 MG tablet Commonly known as: LOPRESSOR   OVER THE COUNTER MEDICATION       TAKE these medications    Accu-Chek Guide Test test strip Generic drug: glucose blood Use 1 each by In Vitro route in the morning, at noon, and at bedtime. May substitute to any manufacturer covered by patient's insurance.   Accu-Chek Guide w/Device Kit Use in the morning, at noon, and at bedtime.   Accu-Chek Softclix Lancets lancets Use as directed.   acetaminophen 325 MG tablet Commonly known as: Tylenol Take 1-2 tablets (325-650 mg total) by mouth every 6 (six) hours as needed. What changed:  medication strength how much to take reasons to take this   ascorbic acid 1000 MG tablet Commonly known as: VITAMIN C Take 1 tablet (1,000 mg total) by mouth daily.   aspirin 81 MG chewable tablet Chew 81 mg by mouth daily.   CINNAMON PO Take 1,200 mg by mouth 2 (two) times daily. [place]   cyanocobalamin 1000 MCG/ML injection Commonly known as: VITAMIN B12 Inject 1 mL (1,000 mcg total) into the muscle every 30 (thirty) days. What changed:  See the new instructions. Another medication with the same name was removed. Continue taking this medication, and follow the directions you see here.   cyclobenzaprine 5 MG tablet Commonly known as: FLEXERIL Take 1 tablet (5 mg total) by mouth 3 (three) times daily as needed for muscle spasms.   famotidine 20  MG tablet Commonly known as: PEPCID TAKE 1 TABLET (20 MG TOTAL) BY MOUTH 2 (TWO) TIMES DAILY AS NEEDED FOR HEARTBURN OR INDIGESTION. What changed: when to take this   finasteride 5 MG tablet Commonly known as: PROSCAR Take 5 mg by mouth daily.   FreeStyle Libre 3 Reader [place] 1 each by Does not apply route as directed. Use with sensor to monitor blood sugar    FreeStyle Libre 3 Sensor Misc 1 each by Does not apply route every 14 (fourteen) days. Place 1 sensor on the skin every 14 days. Use to check glucose continuously   Gemtesa 75 MG Tabs Generic drug: Vibegron Take 75 mg by mouth daily.   Lancet Device Misc 1 each by Does not apply route in the morning, at noon, and at bedtime. May substitute to any manufacturer covered by patient's insurance.   Lancets Misc. Misc 1 each by Does not apply route in the morning, at noon, and at bedtime. May substitute to any manufacturer covered by patient's insurance.   Lantus SoloStar 100 UNIT/ML Solostar Pen Generic drug: insulin glargine Inject 23 Units into the skin daily. What changed: how much to take   metFORMIN 850 MG tablet Commonly known as: GLUCOPHAGE Take 1 tablet (850 mg total) by mouth 2 (two) times daily with a meal. What changed:  medication strength how much to take   multivitamin with minerals Tabs tablet Take 1 tablet by mouth daily.  iron   rosuvastatin 40 MG tablet Commonly known as: CRESTOR Take 1 tablet (40 mg total) by mouth daily at 6 PM. What changed:  how much to take when to take this   sildenafil 100 MG tablet Commonly known as: VIAGRA Take 100 mg by mouth daily as needed for erectile dysfunction.   tamsulosin 0.4 MG Caps capsule Commonly known as: FLOMAX Take 1 capsule (0.4 mg total) by mouth daily.   tolterodine 4 MG 24 hr capsule Commonly known as: DETROL LA Take 4 mg by mouth daily.   traMADol 50 MG tablet Commonly known as: ULTRAM Take 1-2 tablets (50-100 mg total) by mouth every 6 (six) hours as needed for severe pain (pain score 7-10).   Trulicity 0.75 MG/0.5ML Soaj Generic drug: Dulaglutide Inject 0.75 mg into the skin every 30 (thirty) days.   vitamin D3 25 MCG tablet Commonly known as: CHOLECALCIFEROL Take 1 tablet (1,000 Units total) by mouth daily.        Follow-up Information     Latrelle Dodrill, MD Follow up.   Specialty:  Family Medicine Why: Call the office in 1 to 2 days to make arrangements for hospital follow-up appointment Contact information: [address] [place] [address] [phone]         Horton Chin, MD Follow up.   Specialty: Physical Medicine and Rehabilitation Why: As needed Contact information: [address] [place] [address] [phone]         [name], MD Follow up.   Specialty: Orthopedic Surgery Why: Call the office in 1 to 2 days to make arrangements for hospital follow-up appointment Contact information: [address] [place] [address] [phone]         Runell Gess, MD Follow up.   Specialties: Cardiology, Radiology Why: Follow-up as instructed Contact information: [address] [place] [address] [phone]                 Signed: Carlos Levering 08/25/2023, 2:20 PM

## 2023-08-06 NOTE — Progress Notes (Addendum)
 Inpatient Rehabilitation Admission Medication Review by a Pharmacist  A complete drug regimen review was completed for this patient to identify any potential clinically significant medication issues.  High Risk Drug Classes Is patient taking? Indication by Medication  Antipsychotic No   Anticoagulant Yes Enoxaparin  - VTE prophylaxis  Antibiotic No   Opioid No   Antiplatelet Yes Aspirin  81 mg - CAD  Hypoglycemics/insulin  Yes SSI, Insulin  Glargine, Metformin  - Type 2 DM  Vasoactive Medication Yes Metoprolol  - hypertension  Chemotherapy No   Other Yes Finaseteride, Tamsulosin  - BPH Fesoterodine , [place] - overactive bladder Pantoprazole  - GI prophylaxis Rosuvasatin - hyperlipidemia Vitamin D , Vitamin C , Zinc  x 14 days, B-12 IM x 1 on 2/5  - supplements Docusate - laxative  PRNs: Acetaminophen  - mild pain Maalox - indigestion Guaifenesin -dextromethorphan - cough Melatonin - sleep Methocarbamol  - muscle spasms Ondansetron  - nausea, vomiting Bisacodyl , Miralax , Fleets enema - constipation     Type of Medication Issue Identified Description of Issue Recommendation(s)  Drug Interaction(s) (clinically significant)     Duplicate Therapy     Allergy     No Medication Administration End Date     Incorrect Dose     Additional Drug Therapy Needed     Significant med changes from prior encounter (inform family/care partners about these prior to discharge). Metoprolol  tartrate changed to succinate Rosuvastatin  dose increased 20>40 mg Communicate changes with patient/ family prior to discharge.  Other  DC summary indicates plan to resume prior Aspirin  81 mg and Furosemide  20 mg BID (reported taking daily PTA) Also Famotidine  20 mg BID prn   Also off: Trulicity . - Humibid DM - monthly B-12 injections     Therapeutic substitutions: Fesoterodine  for Tolterodine Mirabegron  for Vibegron Resume Aspirin  per Dr. Lorilee Monitor BP and fluid balance and resume Furosemide  if  clinically indicated.   On pantoprazole  daily, prn Maalox  Resume Trulicity  at discharge. Resume Humibid E if needed. Given x 1 on 2/5    Resume home products at discharge.    Clinically significant medication issues were identified that warrant physician communication and completion of prescribed/recommended actions by midnight of the next day:  No  Pharmacist comments:  - confirmed Enoxaparin  30 mg SQ Q24h dosing with Dr. Lorilee; she may increase to 40 mg q24h on 2/7 if no bleeding issues. - PTA Aspirin  81 mg daily to resume.  Time spent performing this drug regimen review (minutes):  25   Adam Ray 08/06/2023 11:29 AM

## 2023-08-06 NOTE — Patient Care Conference (Signed)
 Inpatient RehabilitationTeam Conference and Plan of Care Update Date: 08/06/2023   Time: 11:46 AM    Patient Name: Adam Ray      Medical Record Number: [id number]  Date of Birth: Jul 01, 1956 Sex: Male         Room/Bed: 4W22C/4W22C-01 Payor Info: Payor: [employer] / Plan: [facility] / Product Type: *No Product type* /    Admit Date/Time:  08/05/2023  6:00 PM  Primary Diagnosis:  S/P BKA (below knee amputation) unilateral, left [facility])  Hospital Problems: Principal Problem:   S/P BKA (below knee amputation) unilateral, left (HCC) Active Problems:   S/P BKA (below knee amputation) [facility])    Expected Discharge Date: Expected Discharge Date: 08/20/23  Team Members Present: Physician leading conference: Dr. Sven Elks Social Worker Present: Graeme Jude, LCSWA Nurse Present: Barnie Ronde, RN PT Present: Therisa Stains, PT OT Present: Lorrayne Fritter, OT SLP Present: Rosina Downy, SLP     Current Status/Progress Goal Weekly Team Focus  Bowel/Bladder   incontinent of b/b. LBM 1/30   regain continence   timed toileting    Swallow/Nutrition/ Hydration               ADL's   Mod A UB, Max A LB, Total A toileting   Min A   Barriers- global deconditioning, cognitive impairments, initiation deficits, dynamic sitting/standing balance    Mobility   bed mobility mod A, stand<>stands from elevated EOB with RW and max A, slideboard transfers max A, WC mobility 53ft supervision   supervision  barriers: pain, decreased balance strategies, cognitive impairments    Communication                Safety/Cognition/ Behavioral Observations               Pain   patient complains of 10/10 pain but pt and family do not want anything stronger than tylenol  or ibuprofen    keep pain controlled <5/10   assess pain qshift and PRN offering alternatives from pharmalogical pain relief    Skin   New left BKA. some scratches on left leg   maintain  infection free incision and encourage healing  assess dressing and encourage good nutrition      Discharge Planning:  TBA. per EMR, pt will d/c tohome with his s/o. WIll confirm there are no barriers to discharge.   Team Discussion: Patient post BKA with global deconditioning, new onset visual deficits/blurred vision, poor initiation and cognitive deficits.  Patient on target to meet rehab goals: Currently needs mod assist for upper body care and max assist for lower body care and total assist for toileting.  Able to complete sit-stand from an elevated surface with max assist wearing the other protsthesis and completes a slide-board transfer with max assist. Goals for discharge set for supervision - min assist overall.  *See Care Plan and progress notes for long and short-term goals.   Revisions to Treatment Plan:  Medications adjusted for cognitive changes/hallucinations   Teaching Needs: Safety, medications, skin care/residual limb care, transfers, toileting, etc.   Current Barriers to Discharge: Decreased caregiver support  Possible Resolutions to Barriers: Family education     Medical Summary Current Status: constipation, hypotension, impaired mobility, type 2 diabetes  Barriers to Discharge: Medical stability  Barriers to Discharge Comments: constipation, hypotension, impaired mobility, type 2 diabetes Possible Resolutions to [employer] Focus: continue colace, d/c oxycodone , change lopressor  to Toprol  XL HS, lovenox  and aspirin  started, discussed current blood sugars and advised avoiding added  sugars   Continued Need for Acute Rehabilitation Level of Care: The patient requires daily medical management by a physician with specialized training in physical medicine and rehabilitation for the following reasons: Direction of a multidisciplinary physical rehabilitation program to maximize functional independence : Yes Medical management of patient stability for increased  activity during participation in an intensive rehabilitation regime.: Yes Analysis of laboratory values and/or radiology reports with any subsequent need for medication adjustment and/or medical intervention. : Yes   I attest that I was present, lead the team conference, and concur with the assessment and plan of the team.   Fredericka Sober B 08/06/2023, 3:34 PM

## 2023-08-06 NOTE — Progress Notes (Signed)
 Patient ID: VANESSA ALESI, male   DOB: 1956-05-29, 67 y.o.   MRN: [id number]  SW met with pt in room to inform on updates from team conference, and d/c date 2/19. He is aware SW will follow-up with his wife.   1500-SW spoke with pt wife Jon to inform on above. She reports she works PT M-F 8am-12:30pm, and needs help with lifting him. SW informed will [facility] about [facility] and what service eligibility. She is aware family edu will be recommended closer towards discharge.   SW emailed [place] [facility] to inquire about service connection. SW sent H&P and requested aide referral.  *Pt is non service connected, no SNF,  but eligible for [facility] and DME. She will place aide referral.   Graeme Jude, MSW, LCSW Office: [phone] Cell: [phone] Fax: [phone]

## 2023-08-06 NOTE — Evaluation (Signed)
 Physical Therapy Assessment and Plan  Patient Details  Name: Adam Ray MRN: [id number] Date of Birth: 10-12-1955  PT Diagnosis: Abnormal posture, Abnormality of gait, Cognitive deficits, Coordination disorder, Difficulty walking, Edema, Impaired cognition, Impaired sensation, Muscle weakness, and Pain in L residual limb Rehab Potential: Good ELOS: 2 weeks   Today's Date: 08/06/2023 PT Individual Time: 0815-0926 PT Individual Time Calculation (min): 71 min    Hospital Problem: Principal Problem:   S/P BKA (below knee amputation) unilateral, left (HCC) Active Problems:   S/P BKA (below knee amputation) (HCC)   Past Medical History:  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   Status post transmetatarsal amputation of foot, 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/2018   STEMI involving left circumflex coronary artery (HCC) 07/12/2018   Coronary artery disease   Subacute osteomyelitis, right ankle and foot (HCC) 01/29/2016   Testicular mass 04/18/2016   Past Surgical History:  Past Surgical History:  Procedure Laterality Date   AMPUTATION Right 01/31/2016   Procedure: Right 2nd Toe Amputation;  Surgeon: Jerona LULLA Sage, MD;  Location: [facility] OR;  Service: Orthopedics;  Laterality: Right;   AMPUTATION Right 07/08/2018   Procedure: RIGHT TRANSMETATARSAL AMPUTATION;  Surgeon: Sage Jerona LULLA, MD;  Location: [facility] OR;  Service: Orthopedics;  Laterality: Right;   AMPUTATION Right 01/05/2020    Procedure: RIGHT BELOW KNEE AMPUTATION;  Surgeon: Sage Jerona LULLA, MD;  Location: [facility] OR;  Service: Orthopedics;  Laterality: Right;   AMPUTATION Right 02/23/2020   Procedure: RIGHT BELOW KNEE AMPUTATION REVISION;  Surgeon: Sage Jerona LULLA, MD;  Location: [facility] OR;  Service: Orthopedics;  Laterality: Right;   AMPUTATION Left 10/11/2022   Procedure: LEFT GREAT TOE AMPUTATION AND LEFT 2ND TOE AMPUTATION;  Surgeon: Sage Jerona LULLA, MD;  Location: [facility] OR;  Service: Orthopedics;  Laterality: Left;   AMPUTATION Left 12/25/2022   Procedure: LEFT TRANSMETATARSAL AMPUTATION;  Surgeon: Sage Jerona LULLA, MD;  Location: [facility] OR;  Service: Orthopedics;  Laterality: Left;   AMPUTATION Left 08/01/2023   Procedure: LEFT BELOW KNEE AMPUTATION;  Surgeon: Sage Jerona LULLA, MD;  Location: [facility] OR;  Service: Orthopedics;  Laterality: Left;   CATARACT EXTRACTION     right eye   COLONOSCOPY     CORONARY STENT INTERVENTION N/A 07/12/2018   Procedure: CORONARY STENT INTERVENTION;  Surgeon: Burnard Debby LABOR, MD;  Location: [facility] INVASIVE CV LAB;  Service: Cardiovascular;  Laterality: N/A;   CORONARY/GRAFT ACUTE MI REVASCULARIZATION N/A 07/12/2018   Procedure: Coronary/Graft Acute MI Revascularization;  Surgeon: Burnard Debby LABOR, MD;  Location: [facility] INVASIVE CV LAB;  Service: Cardiovascular;  Laterality: N/A;   I & D EXTREMITY  04/11/2012   Procedure: IRRIGATION AND DEBRIDEMENT EXTREMITY;  Surgeon: Norleen Armor, MD;  Location: [facility] OR;  Service: Orthopedics;  Laterality: Right;   LEFT HEART CATH AND  CORONARY ANGIOGRAPHY N/A 07/12/2018   Procedure: LEFT HEART CATH AND CORONARY ANGIOGRAPHY;  Surgeon: Burnard Debby LABOR, MD;  Location: [facility] INVAS[facility] CV LAB;  Service: Cardiovascular;  Laterality: N/A;   Surgery left great toe     Tear ducts bilateral eyes     TRANSMETATARSAL AMPUTATION Right 07/08/2018    Assessment & Plan Clinical Impression: Patient is a 68 y.o. year old male with  PAD and HTN who presented for left below amputation on 08/01/2023. He  presented with a new ulcer left heel. He was status post a left transmetatarsal amputation. He complained of odor and drainage from the wound for 2 weeks. There was also an ulcer over the transmetatarsal amputation that is 2 cm in diameter with flat healthy granulation tissue no exposed bone or tendon no drainage. Patient denied any specific injury. Prevena wound VAC in place. 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 Wears right lower extremity prosthesis. And uses scooter to get around.     History of DM with A1c of 10% on 08/01/2023, CAD, moderate AS, hypertension, hyperlipidemia, pancytopenia on B12 injections monthly. Patient required Min A +2 to shift hips to EOB, however the patient demonstrated great improvement with bed mobility. The patient requires inpatient medicine and rehabilitation evaluations and services for ongoing dysfunction secondary to bilateral lower extremity amputations.   Patient currently requires max with mobility secondary to muscle weakness and muscle joint tightness, decreased cardiorespiratoy endurance, and decreased sitting balance, decreased standing balance, decreased postural control, decreased balance strategies, and difficulty maintaining precautions.  Prior to hospitalization, patient was modified independent  with mobility and lived with Spouse in a House home.  Home access is  Ramped entrance.  Patient will benefit from skilled PT intervention to maximize safe functional mobility, minimize fall risk, and decrease caregiver burden for planned discharge home with intermittent assist.  Anticipate patient will benefit from follow up [facility] at discharge.  PT - End of Session Activity Tolerance: Tolerates 30+ min activity with multiple rests Endurance Deficit: Yes Endurance Deficit Description: significantly delayed initiation and frequent rest needed PT Assessment Rehab Potential (ACUTE/IP ONLY): Good PT Barriers to Discharge: Decreased caregiver support;Wound Care;Weight bearing  restrictions;Other (comments) PT Barriers to Discharge Comments: pain, cognitive impairments, wife works during the day PT Patient demonstrates impairments in the following area(s): Balance;Behavior;Edema;Endurance;Motor;Nutrition;Pain;Perception;Safety;Sensory;Skin Integrity PT Transfers Functional Problem(s): Bed Mobility;Bed to Chair;Car;Furniture PT Locomotion Functional Problem(s): Ambulation;Wheelchair Mobility;Stairs PT Plan PT Intensity: Minimum of 1-2 x/day ,45 to 90 minutes PT Frequency: 5 out of 7 days PT Duration Estimated Length of Stay: 2 weeks PT Treatment/Interventions: Ambulation/gait training;Discharge planning;Functional mobility training;Psychosocial support;Therapeutic Activities;Visual/perceptual remediation/compensation;Balance/vestibular training;Disease management/prevention;Neuromuscular re-education;Skin care/wound management;Therapeutic Exercise;Wheelchair propulsion/positioning;Cognitive remediation/compensation;DME/adaptive equipment instruction;Pain management;Splinting/orthotics;UE/LE Strength taining/ROM;Community reintegration;Functional electrical stimulation;Patient/family education;Stair training;UE/LE Coordination activities PT Transfers Anticipated Outcome(s): supervision with LRAD PT Locomotion Anticipated Outcome(s): min A with LRAD PT Recommendation Follow Up Recommendations: Home health PT Patient destination: Home Equipment Recommended: To be determined Equipment Details: has RW, art gallery manager, and cane   PT Evaluation Precautions/Restrictions Precautions Precautions: Fall Precaution Comments: LLE Wound Vac Required Braces or Orthoses: Other Brace Other Brace: LLE Limb Guard Restrictions Weight Bearing Restrictions Per Provider Order: Yes LLE Weight Bearing Per Provider Order: Non weight bearing Pain Interference Pain Interference Pain Effect on Sleep: 1. Rarely or not at all (unsure of pt's understanding of question) Pain Interference  with Therapy Activities: 1. Rarely or not at all (unsure of pt's understanding of question) Pain Interference with Day-to-Day Activities: 1. Rarely or not at all Home Living/Prior Functioning  Home Living Available Help at Discharge: Family;Available PRN/intermittently (wife works 8-12 Mon-Fri for [employer]) Type of Home: House Home Access: Ramped entrance Home Layout: One level Bathroom Shower/Tub: Forensic Scientist: Standard Bathroom Accessibility: Yes Additional Comments: Pt reports being mod I using R prosthetic and occasional use of RW inside home. Used art gallery manager for h&r block. Has w/c (without leg rests & > 5 years ago), RW, SPC, shower chair, had BSC but lost it  Lives With: Spouse Prior Function Level of Independence: Requires assistive device for independence;Independent with basic ADLs  Able to Take Stairs?: Yes Driving: Yes Vision/Perception  Vision - History Ability to See in Adequate Light: 2 Moderately impaired Vision - Assessment Additional Comments: Pt reports being unable to see details or words, but can see figures/outlines and objects functionally, reporting this has changed since surgery Perception Perception: Impaired Preception Impairment Details: Spatial orientation Praxis Praxis: Impaired Praxis Impairment Details: Initiation;Motor planning  Cognition Overall Cognitive Status: Impaired/Different from baseline Arousal/Alertness: Lethargic Orientation Level: Oriented to person;Oriented to place;Oriented to situation Memory: Impaired Awareness: Impaired Problem Solving: Impaired Safety/Judgment: Impaired Sensation Sensation Light Touch: Impaired Detail Hot/Cold: Not tested Proprioception: Impaired by gross assessment Stereognosis: Not tested Additional Comments: pt getting L and R confused; absent sensation along distal aspect of R residual limb. Coordination Gross Motor Movements are Fluid and  Coordinated: No Fine Motor Movements are Fluid and Coordinated: No Coordination and Movement Description: grossly uncoordinated due to altered balance strategies 2/2 bilateral BKA, global weakness/deconditioning, cognitive impairments, and pain Finger Nose Finger Test: slow bilaterally Heel Shin Test: unable to perform due to bilateral BKA. Motor  Motor Motor: Abnormal postural alignment and control Motor - Skilled Clinical Observations: grossly uncoordinated due to altered balance strategies 2/2 bilateral BKA, global weakness/deconditioning, cognitive impairments, and pain  Trunk/Postural Assessment  Cervical Assessment Cervical Assessment: Exceptions to [facility] (forward head) Thoracic Assessment Thoracic Assessment: Exceptions to [facility] (rounded shoulders) Lumbar Assessment Lumbar Assessment: Exceptions to [facility] (posterior pelvic tilt) Postural Control Postural Control: Deficits on evaluation Trunk Control: posterior lean in sitting Protective Responses: delayed and inadequate  Balance Balance Balance Assessed: Yes Static Sitting Balance Static Sitting - Balance Support: Feet supported;Bilateral upper extremity supported Static Sitting - Level of Assistance: 5: Stand by assistance (supervision) Dynamic Sitting Balance Dynamic Sitting - Balance Support: Feet unsupported;No upper extremity supported Dynamic Sitting - Level of Assistance: 5: Stand by assistance (CGA) Static Standing Balance Static Standing - Balance Support: Bilateral upper extremity supported;During functional activity (RW) Static Standing - Level of Assistance: 3: Mod assist Static Stance: on Foam, Eyes Closed: with sit<>stand transfers Extremity Assessment  RLE Assessment RLE Assessment: Not tested General Strength Comments: grossly 3+/5 LLE Assessment LLE Assessment: Not tested General Strength Comments: grossly 3-/5  Care Tool Care Tool Bed Mobility Roll left and right activity   Roll left and right assist  level: Moderate Assistance - Patient 50 - 74%    Sit to lying activity        Lying to sitting on side of bed activity   Lying to sitting on side of bed assist level: the ability to move from lying on the back to sitting on the side of the bed with no back support.: Moderate Assistance - Patient 50 - 74%     Care Tool Transfers Sit to stand transfer   Sit to stand assist level: 2 Helpers    Chair/bed transfer   Chair/bed transfer assist level: Maximal Assistance - Patient 25 - 49% (SB)  Licensed Conveyancer transfer activity did not occur: Safety/medical concerns (pain)        Care Tool Locomotion Ambulation Ambulation activity did not occur: Safety/medical concerns (pain, decreased balance on R prosthetic)        Walk 10 feet activity Walk 10 feet activity did not occur: Safety/medical concerns (pain, decreased balance on R prosthetic)       Walk 50 feet with 2 turns activity Walk 50 feet with 2 turns activity did not occur: Safety/medical concerns (pain, decreased balance on R prosthetic)      Walk 150 feet activity Walk 150 feet activity did not occur: Safety/medical concerns (pain, decreased balance on R prosthetic)      Walk 10 feet on uneven surfaces activity Walk 10 feet on uneven surfaces activity did not occur: Safety/medical concerns (pain, decreased balance on R prosthetic)      Stairs Stair activity did not occur: Safety/medical concerns (pain, decreased balance on R prosthetic)        Walk up/down 1 step activity Walk up/down 1 step or curb (drop down) activity did not occur: Safety/medical concerns (pain, decreased balance on R prosthetic)      Walk up/down 4 steps activity Walk up/down 4 steps activity did not occur: Safety/medical concerns (pain, decreased balance on R prosthetic)      Walk up/down 12 steps activity Walk up/down 12 steps activity did not occur: Safety/medical concerns (pain, decreased balance on R prosthetic)      Pick up small objects  from floor Pick up small object from the floor (from standing position) activity did not occur: Safety/medical concerns (pain, decreased balance on R prosthetic)      Wheelchair Is the patient using a wheelchair?: Yes Type of Wheelchair: Manual   Wheelchair assist level: Supervision/Verbal cueing Max wheelchair distance: 51ft  Wheel 50 feet with 2 turns activity   Assist Level: Moderate Assistance - Patient 50 - 74%  Wheel 150 feet activity   Assist Level: Maximal Assistance - Patient 25 - 49%    Refer to Care Plan for Long Term Goals  SHORT TERM GOAL WEEK 1 PT Short Term Goal 1 (Week 1): pt will perform bed mobility with min A PT Short Term Goal 2 (Week 1): pt will transfer bed<>chair with LRAD and mod A PT Short Term Goal 3 (Week 1): pt will transfer sit<>stand with LRAD and mod A  Recommendations for other services: None   Skilled Therapeutic Intervention Evaluation completed (see details above and below) with education on PT POC and goals and individual treatment initiated with focus on limb loss education, dressing, functional mobility/transfers, generalized strengthening and endurance, dynamic standing balance/coordination. Received pt semi-reclined in bed with RN and wife at bedside. Pt educated on PT evaluation, CIR policies, and therapy schedule and agreeable. Pt reported pain 9/10 in L residual limb (premedicated), however pts wife reports Oxycontin  makes pt hallucinate and does not want pt to have it.  Provided pt with 16x16 manual WC L amputee support pad and slideboard. Donned pants in supine with max A provided by wife (assist from therapist to disconnect/reconnect wound vac). Pt rolled L/R with mod A using bedrails with cues for rolling technique and dependent to pull pants over hips. Pt transferred semi-reclined<>sitting L EOB with HOB elevated and use of bedrails with cues for hand placement with mod A for trunk control. Pt required increased time but able to scoot to EOB  without physical assist and donned limb guard with max A. Donned gel liner  with set up assist but required total A to click in prosthetic. Stood from EOB with RW x 2 trials - trial 1 with max A (wife providing +2 min A) and trial 2 with max A provided by therapist only. Pt with heavy reliance on BUE support and unable to pivot or clear prosthetic leg - therefore returned to sitting and performed slideboard transfer bed<>WC with max A - max cues for anterior weight shifting, hand placement, and scooting technique. Pt performed WC mobility ~79ft using BUE and supervision. Transported back to room in [facility] dependently and concluded session with pt sitting in WC, needs within reach, and seatbelt alarm on. Wife at bedside and safety plan updated.   Mobility Bed Mobility Bed Mobility: Rolling Right;Rolling Left;Supine to Sit Rolling Right: Moderate Assistance - Patient 50-74% Rolling Left: Moderate Assistance - Patient 50-74% Supine to Sit: Moderate Assistance - Patient 50-74% Transfers Transfers: Sit to Stand;Stand to Sit;Lateral/Scoot Transfers Sit to Stand: 2 Helpers Stand to Sit: Moderate Assistance - Patient 50-74% Lateral/Scoot Transfers: Maximal Assistance - Patient 25-49% Transfer (Assistive device): Rolling walker (and slideboard) Locomotion  Gait Ambulation: No Gait Gait: No Stairs / Additional Locomotion Stairs: No Wheelchair Mobility Wheelchair Mobility: Yes Wheelchair Assistance: Doctor, General Practice: Both upper extremities Wheelchair Parts Management: Needs assistance Distance: 43ft   Discharge Criteria: Patient will be discharged from PT if patient refuses treatment 3 consecutive times without medical reason, if treatment goals not met, if there is a change in medical status, if patient makes no progress towards goals or if patient is discharged from hospital.  The above assessment, treatment plan, treatment alternatives and goals were discussed and  mutually agreed upon: by patient and by family  Brendy Ficek M Zaunegger Enrique Weiss Zaunegger PT, DPT 08/06/2023, 12:07 PM

## 2023-08-06 NOTE — Evaluation (Signed)
 Occupational Therapy Assessment and Plan  Patient Details  Name: Adam Ray MRN: [id number] Date of Birth: [birth year]  OT Diagnosis: abnormal posture, acute pain, cognitive deficits, disturbance of vision, and swelling of limb Rehab Potential: Rehab Potential (ACUTE ONLY): Fair ELOS: 2 weeks   Today's Date: 08/06/2023 OT Individual Time: 1020-1130 OT Individual Time Calculation (min): 70 min     Hospital Problem: Principal Problem:   S/P BKA (below knee amputation) unilateral, left (HCC) Active Problems:   S/P BKA (below knee amputation) (HCC)   Past Medical History:  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   Status post transmetatarsal amputation of foot, 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/2018   STEMI involving left circumflex coronary artery (HCC) 07/12/2018   Coronary artery disease   Subacute osteomyelitis, right ankle and foot (HCC) 01/29/2016   Testicular mass 04/18/2016   Past Surgical History:  Past Surgical History:  Procedure Laterality Date   AMPUTATION Right 01/31/2016   Procedure: Right 2nd Toe Amputation;  Surgeon: Jerona LULLA Sage, MD;  Location: [facility] OR;  Service: Orthopedics;  Laterality: Right;   AMPUTATION Right 07/08/2018   Procedure: RIGHT TRANSMETATARSAL AMPUTATION;  Surgeon: Sage Jerona LULLA, MD;  Location: [facility] OR;  Service: Orthopedics;  Laterality: Right;   AMPUTATION Right 01/05/2020   Procedure: RIGHT BELOW KNEE AMPUTATION;  Surgeon: Sage Jerona LULLA,  MD;  Location: [facility] OR;  Service: Orthopedics;  Laterality: Right;   AMPUTATION Right 02/23/2020   Procedure: RIGHT BELOW KNEE AMPUTATION REVISION;  Surgeon: Sage Jerona LULLA, MD;  Location: [facility] OR;  Service: Orthopedics;  Laterality: Right;   AMPUTATION Left 10/11/2022   Procedure: LEFT GREAT TOE AMPUTATION AND LEFT 2ND TOE AMPUTATION;  Surgeon: Sage Jerona LULLA, MD;  Location: [facility] OR;  Service: Orthopedics;  Laterality: Left;   AMPUTATION Left 12/25/2022   Procedure: LEFT TRANSMETATARSAL AMPUTATION;  Surgeon: Sage Jerona LULLA, MD;  Location: [facility] OR;  Service: Orthopedics;  Laterality: Left;   AMPUTATION Left 08/01/2023   Procedure: LEFT BELOW KNEE AMPUTATION;  Surgeon: Sage Jerona LULLA, MD;  Location: [facility] OR;  Service: Orthopedics;  Laterality: Left;   CATARACT EXTRACTION     right eye   COLONOSCOPY     CORONARY STENT INTERVENTION N/A 07/12/2018   Procedure: CORONARY STENT INTERVENTION;  Surgeon: Burnard Debby LABOR, MD;  Location: [facility] INVASIVE CV LAB;  Service: Cardiovascular;  Laterality: N/A;   CORONARY/GRAFT ACUTE MI REVASCULARIZATION N/A 07/12/2018   Procedure: Coronary/Graft Acute MI Revascularization;  Surgeon: Burnard Debby LABOR, MD;  Location: [facility] INVASIVE CV LAB;  Service: Cardiovascular;  Laterality: N/A;   I & D EXTREMITY  04/11/2012   Procedure: IRRIGATION AND DEBRIDEMENT EXTREMITY;  Surgeon: Norleen Armor, MD;  Location: [facility] OR;  Service: Orthopedics;  Laterality: Right;   LEFT HEART CATH AND CORONARY ANGIOGRAPHY N/A 07/12/2018  Procedure: LEFT HEART CATH AND CORONARY ANGIOGRAPHY;  Surgeon: Burnard Debby LABOR, MD;  Location: [facility] INVASIVE CV LAB;  Service: Cardiovascular;  Laterality: N/A;   Surgery left great toe     Tear ducts bilateral eyes     TRANSMETATARSAL AMPUTATION Right 07/08/2018    Assessment & Plan Clinical Impression:  Adam Ray is a 68 year old diabetic with PAD and HTN who presented for left below amputation on 08/01/2023. He presented with a new ulcer left heel. He was status post a left  transmetatarsal amputation. He complained of odor and drainage from the wound for 2 weeks. There was also an ulcer over the transmetatarsal amputation that is 2 cm in diameter with flat healthy granulation tissue no exposed bone or tendon no drainage. Patient denied any specific injury. Prevena wound VAC in place. 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 Wears right lower extremity prosthesis. And uses scooter to get around.     History of DM with A1c of 10% on 08/01/2023, CAD, moderate AS, hypertension, hyperlipidemia, pancytopenia on B12 injections monthly. Patient required Min A +2 to shift hips to EOB, however the patient demonstrated great improvement with bed mobility. The patient requires inpatient medicine and rehabilitation evaluations and services for ongoing dysfunction secondary to bilateral lower extremity amputations.  Patient transferred to CIR on 08/05/2023 .    Patient currently requires max-total A with basic self-care skills secondary to muscle weakness, decreased cardiorespiratoy endurance, decreased coordination, decreased visual acuity, decreased initiation, decreased awareness, decreased problem solving, decreased safety awareness, decreased memory, and delayed processing, and decreased sitting balance and decreased standing balance.  Prior to hospitalization, patient could complete self-care with mod I.  Patient will benefit from skilled intervention to decrease level of assist with basic self-care skills prior to discharge home with care partner.  Anticipate patient will require minimal physical assistance and follow up home health.  OT - End of Session Activity Tolerance: Tolerates < 10 min activity, no significant change in vital signs Endurance Deficit: Yes Endurance Deficit Description: significantly delayed initiation and frequent rest needed OT Assessment Rehab Potential (ACUTE ONLY): Fair OT Barriers to Discharge: Home environment access/layout;Decreased caregiver support;Wound  Care;Other (comments) OT Barriers to Discharge Comments: wound vac OT Patient demonstrates impairments in the following area(s): Balance;Cognition;Edema;Endurance;Motor;Pain;Safety;Sensory;Skin Integrity;Vision OT Basic ADL's Functional Problem(s): Eating;Grooming;Bathing;Dressing;Toileting OT Advanced ADL's Functional Problem(s): Simple Meal Preparation OT Transfers Functional Problem(s): Toilet;Tub/Shower OT Additional Impairment(s): None OT Plan OT Intensity: Minimum of 1-2 x/day, 45 to 90 minutes OT Frequency: 5 out of 7 days OT Duration/Estimated Length of Stay: 2 weeks OT Treatment/Interventions: Balance/vestibular training;Discharge planning;Pain management;Self Care/advanced ADL retraining;Therapeutic Activities;UE/LE Coordination activities;Cognitive remediation/compensation;Disease mangement/prevention;Functional mobility training;Patient/family education;Skin care/wound managment;Therapeutic Exercise;Visual/perceptual remediation/compensation;DME/adaptive equipment instruction;Neuromuscular re-education;Psychosocial support;UE/LE Strength taining/ROM;Wheelchair propulsion/positioning OT Self Feeding Anticipated Outcome(s): Set up A OT Basic Self-Care Anticipated Outcome(s): Min A OT Toileting Anticipated Outcome(s): Min A OT Bathroom Transfers Anticipated Outcome(s): Min A OT Recommendation Recommendations for Other Services: Neuropsych consult;Therapeutic Recreation consult Therapeutic Recreation Interventions: Pet therapy Patient destination: Home Follow Up Recommendations: Home health OT Equipment Recommended: To be determined   OT Evaluation Precautions/Restrictions  Precautions Precautions: Fall Precaution Comments: LLE Wound Vac Required Braces or Orthoses: Other Brace Other Brace: LLE Limb Guard Restrictions Weight Bearing Restrictions Per Provider Order: Yes LLE Weight Bearing Per Provider Order: Non weight bearing Home Living/Prior Functioning Home  Living Family/patient expects to be discharged to:: Private residence Living Arrangements: Spouse/significant other Available Help at Discharge: Family, Available PRN/intermittently (wife works 8-12 Mon-Fri for [employer]) Type of Home: House Home Access: Ramped entrance Home Layout: One level Bathroom Shower/Tub: Tub/shower unit, Engineer, Building Services: Standard Bathroom Accessibility: Yes Additional Comments: Pt reports being mod I using R prosthetic and occasional use of RW inside home. Used art gallery manager for h&r block. Has w/c (without leg rests & > 5 years ago), RW, SPC, shower chair, had BSC but lost it  Lives With: Spouse IADL History Homemaking Responsibilities: Yes Meal Prep Responsibility: Primary Leisure and Hobbies: Baseball Prior Function Level of Independence: Requires assistive device for independence, Independent with basic ADLs  Able to Take Stairs?: Yes Driving: Yes Vision Baseline Vision/History: 1 Wears glasses Ability to See in Adequate Light: 2 Moderately impaired Patient Visual Report: Blurring of vision Vision Assessment?: Vision impaired- to be further tested in functional context Additional Comments: Pt reports being unable to see details or words, but can see figures/outlines and objects functionally, reporting this has changed since surgery Perception  Perception: Impaired Praxis Praxis: Impaired Praxis Impairment Details: Initiation;Motor planning Cognition Cognition Overall Cognitive Status: Impaired/Different from baseline Arousal/Alertness: Lethargic Orientation Level: Person;Place;Situation Person: Oriented Place: Oriented Situation: Oriented Memory: Impaired Awareness: Impaired Problem Solving: Impaired Safety/Judgment: Impaired Brief Interview for Mental Status (BIMS) Repetition of Three Words (First Attempt): 3 Temporal Orientation: Year: Correct Temporal Orientation: Month: Missed by 6 days to 1  month Temporal Orientation: Day: Incorrect Recall: Sock: Yes, no cue required Recall: Blue: Yes, no cue required Recall: Bed: Yes, no cue required BIMS Summary Score: 13 Sensation Sensation Light Touch: Impaired Detail Hot/Cold: Not tested Proprioception: Impaired by gross assessment Stereognosis: Not tested Additional Comments: pt getting L and R confused; absent sensation along distal aspect of R residual limb. Coordination Gross Motor Movements are Fluid and Coordinated: No Fine Motor Movements are Fluid and Coordinated: No Coordination and Movement Description: grossly uncoordinated due to altered balance strategies 2/2 bilateral BKA, global weakness/deconditioning, cognitive impairments, and pain Finger Nose Finger Test: slow bilaterally Heel Shin Test: unable to perform due to bilateral BKA. Motor  Motor Motor: Abnormal postural alignment and control Motor - Skilled Clinical Observations: grossly uncoordinated due to altered balance strategies 2/2 bilateral BKA, global weakness/deconditioning, cognitive impairments, and pain  Trunk/Postural Assessment  Cervical Assessment Cervical Assessment: Exceptions to [facility] (forward head) Thoracic Assessment Thoracic Assessment: Exceptions to [facility] (rounded shoulders) Lumbar Assessment Lumbar Assessment: Exceptions to [facility] (posterior pelvic tilt) Postural Control Postural Control: Deficits on evaluation Trunk Control: posterior lean in sitting Protective Responses: delayed and inadequate  Balance Balance Balance Assessed: Yes Static Sitting Balance Static Sitting - Balance Support: Feet supported;Bilateral upper extremity supported Static Sitting - Level of Assistance: 5: Stand by assistance (supervision) Dynamic Sitting Balance Dynamic Sitting - Balance Support: Feet unsupported;No upper extremity supported Dynamic Sitting - Level of Assistance: 5: Stand by assistance (CGA) Static Standing Balance Static Standing - Balance  Support: Bilateral upper extremity supported;During functional activity (RW) Static Standing - Level of Assistance: 3: Mod assist Static Stance: on Foam, Eyes Closed: with sit<>stand transfers Extremity/Trunk Assessment RUE Assessment RUE Assessment: Exceptions to [facility] Active Range of Motion (AROM) Comments: Limited to 100 degrees shoulder flexion, WFL distally General Strength Comments: 3-/5 grossly LUE Assessment LUE Assessment: Exceptions to [facility] Active Range of Motion (AROM) Comments: Limited to 90 degrees shoulder flexion, WFL distally General Strength Comments: 3-/5 grossly  Care Tool Care Tool Self Care Eating   Eating Assist Level: Minimal Assistance - Patient > 75%    Oral Care    Oral Care Assist Level: Minimal Assistance - Patient >  75%    Bathing   Body parts bathed by patient: Right arm;Left arm;Chest;Abdomen Body parts bathed by helper: Front perineal area;Buttocks;Right upper leg;Left upper leg;Face Body parts n/a: Right lower leg;Left lower leg (bilateral BKA) Assist Level: Maximal Assistance - Patient 24 - 49%    Upper Body Dressing(including orthotics)   What is the patient wearing?: Pull over shirt   Assist Level: Moderate Assistance - Patient 50 - 74%    Lower Body Dressing (excluding footwear)   What is the patient wearing?: Incontinence brief;Pants Assist for lower body dressing: Total Assistance - Patient < 25%    Putting on/Taking off footwear   What is the patient wearing?: Orthosis (RLE prosthesis) Assist for footwear: Dependent - Patient 0%       Care Tool Toileting Toileting activity   Assist for toileting: Total Assistance - Patient < 25%     Care Tool Bed Mobility Roll left and right activity   Roll left and right assist level: Moderate Assistance - Patient 50 - 74%    Sit to lying activity        Lying to sitting on side of bed activity   Lying to sitting on side of bed assist level: the ability to move from lying on the back to  sitting on the side of the bed with no back support.: Moderate Assistance - Patient 50 - 74%     Care Tool Transfers Sit to stand transfer   Sit to stand assist level: 2 Helpers    Chair/bed transfer   Chair/bed transfer assist level: Maximal Assistance - Patient 25 - 49% (SB)     Toilet transfer   Assist Level: Dependent - Patient 0% (stedy)     Care Tool Cognition  Expression of Ideas and Wants Expression of Ideas and Wants: 4. Without difficulty (complex and basic) - expresses complex messages without difficulty and with speech that is clear and easy to understand  Understanding Verbal and Non-Verbal Content Understanding Verbal and Non-Verbal Content: 3. Usually understands - understands most conversations, but misses some part/intent of message. Requires cues at times to understand   Memory/Recall Ability Memory/Recall Ability : Current season   Refer to Care Plan for Long Term Goals  SHORT TERM GOAL WEEK 1 OT Short Term Goal 1 (Week 1): Pt will complete sit > stand in prep for ADL with mod A using LRAD OT Short Term Goal 2 (Week 1): Pt will complete 1/3 toileting steps with mod A for balance OT Short Term Goal 3 (Week 1): Pt will donn LB clothing with mod A OT Short Term Goal 4 (Week 1): Pt will complete toilet transfer with mod A using LRAD  Recommendations for other services: Neuropsych and Therapeutic Recreation  Pet therapy   Skilled Therapeutic Intervention Patient received seated in w/c upon therapy arrival and agreeable to participate in OT evaluation. Wife present. Education provided on OT purpose, therapy schedule, goals for therapy, and safety policy while in rehab. No pain reported during session. MD notified of pt's wife concern regarding pt's slowness suggesting he normally receives B12 injection for energy, and it concern for it to be medication related.   Patient demonstrates cognitive (initiation, problem solving, awareness, sequencing, safety), global  strength, visual (complete blurred vision- only able to see figures vs details), and dynamic sitting/standing balance deficits resulting in difficulty completing BADL tasks without increased physical assist. Cues needed throughout for initiation, task completion, safety and body positioning. Pt will benefit from skilled OT services to focus  on mentioned deficits. See below for ADL and functional transfer performance. ADLs completed at sink side, with LB care simulated. Stedy utilized for toileting transfer. Pt remained seated in w/c at conclusion of session with belt alarm on and all needs met at end of session.    ADL ADL Eating: Minimal assistance Where Assessed-Eating: Wheelchair Grooming: Minimal assistance Where Assessed-Grooming: Sitting at sink Upper Body Bathing: Supervision/safety Where Assessed-Upper Body Bathing: Sitting at sink Lower Body Bathing: Dependent (simulated) Where Assessed-Lower Body Bathing: Sitting at sink;Standing at sink Upper Body Dressing: Moderate assistance Where Assessed-Upper Body Dressing: Wheelchair Lower Body Dressing: Dependent (simulated) Where Assessed-Lower Body Dressing: Sitting at sink;Standing at sink Toileting: Dependent Where Assessed-Toileting: Bedside Commode Toilet Transfer: Dependent Toilet Transfer Method: Other (comment) (stedy) Toilet Transfer Equipment: Bedside commode Tub/Shower Transfer: Unable to assess Tub/Shower Transfer Method: Unable to assess Film/video Editor: Unable to assess Film/video Editor Method: Unable to assess Mobility  Bed Mobility Bed Mobility: Rolling Right;Rolling Left;Supine to Sit Rolling Right: Moderate Assistance - Patient 50-74% Rolling Left: Moderate Assistance - Patient 50-74% Supine to Sit: Moderate Assistance - Patient 50-74% Transfers Sit to Stand: 2 Helpers Stand to Sit: Moderate Assistance - Patient 50-74%   Discharge Criteria: Patient will be discharged from OT if patient refuses  treatment 3 consecutive times without medical reason, if treatment goals not met, if there is a change in medical status, if patient makes no progress towards goals or if patient is discharged from hospital.  The above assessment, treatment plan, treatment alternatives and goals were discussed and mutually agreed upon: by patient and by family  Lorrayne FORBES Fritter, MS, OTR/L  08/06/2023, 12:03 PM

## 2023-08-07 ENCOUNTER — Ambulatory Visit: Payer: No Typology Code available for payment source | Admitting: Orthopedic Surgery

## 2023-08-07 DIAGNOSIS — Z89512 Acquired absence of left leg below knee: Secondary | ICD-10-CM | POA: Diagnosis not present

## 2023-08-07 LAB — GLUCOSE, CAPILLARY
Glucose-Capillary: 252 mg/dL — ABNORMAL HIGH (ref 70–99)
Glucose-Capillary: 247 mg/dL — ABNORMAL HIGH (ref 70–99)
Glucose-Capillary: 214 mg/dL — ABNORMAL HIGH (ref 70–99)
Glucose-Capillary: 166 mg/dL — ABNORMAL HIGH (ref 70–99)

## 2023-08-07 MED ORDER — L-METHYLFOLATE-B6-B12 3-35-2 MG PO TABS
1.0000 | ORAL_TABLET | Freq: Every day | ORAL | Status: DC
  Administered 2023-08-07 – 2023-08-23 (×17): 1 via ORAL
  Filled 2023-08-07 (×17): qty 1

## 2023-08-07 MED ORDER — METOPROLOL SUCCINATE ER 25 MG PO TB24
12.5000 mg | ORAL_TABLET | Freq: Every day | ORAL | Status: DC
Start: 2023-08-07 — End: 2023-08-11
  Administered 2023-08-07 – 2023-08-10 (×4): 12.5 mg via ORAL
  Filled 2023-08-07 (×4): qty 1

## 2023-08-07 NOTE — Progress Notes (Signed)
 Patient ID: [name], male   DOB: Dec 15, 1955, 68 y.o.   MRN: [id number]  [id number] SW left message for pt wife to inform on ELOS being 2 weeks, and updates from [place]. SW shared there will be updates after team conference.   Graeme Jude, MSW, LCSW Office: [phone] Cell: [phone] Fax: [phone]

## 2023-08-07 NOTE — Progress Notes (Signed)
 Occupational Therapy Session Note  Patient Details  Name: BARTOLO MONTANYE MRN: [id number] Date of Birth: [birth year]  Today's Date: 08/07/2023 OT Individual Time: 1346-1501 OT Individual Time Calculation (min): 75 min    Short Term Goals: Week 1:  OT Short Term Goal 1 (Week 1): Pt will complete sit > stand in prep for ADL with mod A using LRAD OT Short Term Goal 2 (Week 1): Pt will complete 1/3 toileting steps with mod A for balance OT Short Term Goal 3 (Week 1): Pt will donn LB clothing with mod A OT Short Term Goal 4 (Week 1): Pt will complete toilet transfer with mod A using LRAD  Skilled Therapeutic Interventions/Progress Updates:   Pt greeted seated in w/c, pt agreeable to OT intervention.       ADLs:  Bathing: pt completed bathing seated at sink with overall supervision ( pt only bathed UB) pt needed cues to problem solve how to open soap, pt noted to slightly perseverate on bathing tasks noted to wash the same area multiple times.   Transfers: pt completed squat pivot to toilet with use of grab bar with MODA, pt able to pivot on R prosthetic with + time. Slow to descend onto seat needing extra support when lowering on toilet. Heavy MODA to rise from low toilet with use of grab bar on R side. Once in standing pt able to hold grab bar with BUEs to pivot back to w/c to L side.  Toileting: pt completed 3/3 toileting tasks with MAXA, pt was able to complete pericare via anterior lean but needed assist to manage pants/brief.  continent b/b void.    Education: discussed DME needs with pt reporting tub at home. Recommended TTB for home and provided visual demo of transfer with pt in agreement with DME.   Pt stated  I'm going to have to change my whole life when discussing hobbies ( pt reports he likes to farm/garden). Discussed that pt would likely need to wait to get prosthetic for LLE before being able to fully return to this hobby but encouraged pt to recall what exact;y he likes about  these tasks in order to see if he can facilitate the same core feeling completing another task.    Ended session with pt seated in w/c at sink finishing up bathing, NT present.                Therapy Documentation Precautions:  Precautions Precautions: Fall Precaution Comments: LLE Wound Vac Required Braces or Orthoses: Other Brace Other Brace: LLE Limb Guard Restrictions Weight Bearing Restrictions Per Provider Order: Yes LLE Weight Bearing Per Provider Order: Non weight bearing  Pain: Pt reports soreness in LLE, rest breaks provided as needed.    Therapy/Group: Individual Therapy  Ronal Mallie Needy 08/07/2023, 3:11 PM

## 2023-08-07 NOTE — Progress Notes (Signed)
 Physical Therapy Session Note  Patient Details  Name: Adam Ray MRN: [id number] Date of Birth: [birth year]  Today's Date: 08/07/2023 PT Individual Time: [phone] and [id number] PT Individual Time Calculation (min): 55 min and 70 min  Short Term Goals: Week 1:  PT Short Term Goal 1 (Week 1): pt will perform bed mobility with min A PT Short Term Goal 2 (Week 1): pt will transfer bed<>chair with LRAD and mod A PT Short Term Goal 3 (Week 1): pt will transfer sit<>stand with LRAD and mod A  Skilled Therapeutic Interventions/Progress Updates:   Treatment Session 1 Received pt semi-reclined in bed with NT changing brief. Pt agreeable to PT treatment and reported pain 10/10 in L residual limb (premedicated). Session with emphasis on dressing, functional mobility/transfers, generalized strengthening and endurance, dynamic standing balance/coordination, and WC mobility. Donned scrub shorts in supine with increased time but without assist, rolled L/R with use of bedrails and chuck pads and required max A to pull shorts over hips. Donned limb guard with mod A and increased time and pt transferred semi-reclined<>sitting L EOB with HOB elevated and use of bedrails with supervision and increased time/effort. Pt able to don gel liner and R prosthetic with set up assist and increased time. Stood from elevated EOB with RW and light max A x 4 trials with BUE support on RW. Pt performed L hip flexion 2x10 and L hip abduction 2x10 in standing with min A for balance with emphasis on LE strength/ROM - noted poor eccentric control when sitting down onto bed with tendency to plop down.   Pt transferred bed<>WC via lateral scoot to R with CGA and numerous small scoots. Pt performed WC mobility 185ft using BUE and supervision with increased time to dayroom with emphasis on BUE strength - WC pulling to R and provided cues to maximize stride length/efficiency. Discussed getting lightweight WC to ease propulsion and to decrease  caregiver burden at home. Transported back to room in [facility] dependently and concluded session with pt sitting in WC, needs within reach, and seatbelt alarm on. Pt requesting to return to bed despite encouragement to remain up - RN notified.   Treatment Session 2 Received pt sitting in WC, pt agreeable to PT treatment, and did not state pain level. Session with emphasis on functional mobility/transfers, generalized strengthening and endurance, dynamic standing balance/coordination, and limb loss education. Donned limb guard with mod A and educated pt on importance of keeping limb guard on and keeping shrinker pulled up above the knee. Pt transported to/from room in [facility] dependently for time management purposes. Transferred on/off mat via lateral scoot with CGA via multiple small scoots with assist to stabilize WC. Transferred into supine on wedge and prone with supervision. Removed prosthetic dependently and performed the following exercises with emphasis on LE strength, ROM, and contracture prevention: -supine SLR 2x12 bilaterally -supine hip abduction 2x15 bilaterally -supine R single leg bridges 2x8 -supine hip adduction ball squeezes 2x10 with 3 second isometric hold  -prone hip extension 2x10 bilaterally -prone <>prone on extended elbows 10x5 second hold Pt required increased time with exercises due to deconditioning.Transferred prone<>supine<>long sitting<>scooting to EOM with supervision and significantly increased time. Donned gel liner and prosthetic with set up assist and transported back to room in [facility] dependently. Concluded session with pt sitting in WC, needs within reach, and seatbelt alarm on.   Therapy Documentation Precautions:  Precautions Precautions: Fall Precaution Comments: LLE Wound Vac Required Braces or Orthoses: Other Brace Other Brace: LLE Limb Guard Restrictions  Weight Bearing Restrictions Per Provider Order: Yes LLE Weight Bearing Per Provider Order: Non weight  bearing  Therapy/Group: Individual Therapy Therisa HERO Zaunegger Therisa Stains PT, DPT 08/07/2023, 6:33 AM

## 2023-08-07 NOTE — Progress Notes (Signed)
 PROGRESS NOTE   Subjective/Complaints: No new complaints this morning Provided list of foods for diabetes Discussed visual deficits, possible diabetic retinopathy  ROS: +visual deficits  Objective:   No results found. Recent Labs    08/06/23 0517  WBC 4.8  HGB 8.3*  HCT 24.6*  PLT 165   Recent Labs    08/06/23 0517  NA 132*  K 4.1  CL 95*  CO2 27  GLUCOSE 272*  BUN 20  CREATININE 0.92  CALCIUM  8.6*    Intake/Output Summary (Last 24 hours) at 08/07/2023 1022 Last data filed at 08/07/2023 0801 Gross per 24 hour  Intake 720 ml  Output --  Net 720 ml        Physical Exam: Vital Signs Blood pressure (!) 104/52, pulse 62, temperature 98.4 F (36.9 C), temperature source Oral, resp. rate 16, height 5' 10 (1.778 m), weight 65.4 kg, SpO2 95%. Constitutional:      General: He is not in acute distress.    Comments: Confused- not able to dial wife's number on phone, sitting up in bed- scooter at bedside- not wearing R prosthesis; NAD  HENT:     Head: Normocephalic and atraumatic.     Comments: No teeth    Right Ear: External ear normal.     Left Ear: External ear normal.     Nose: Nose normal. No congestion.     Mouth/Throat:     Mouth: Mucous membranes are dry.     Pharynx: Oropharynx is clear. No oropharyngeal exudate.  Eyes:     General:        Right eye: No discharge.        Left eye: No discharge.     Extraocular Movements: Extraocular movements intact.  Cardiovascular:     Rate and Rhythm: Normal rate and regular rhythm.     Heart sounds: Normal heart sounds. No murmur heard.    No gallop.  Pulmonary:     Effort: Pulmonary effort is normal. No respiratory distress.     Breath sounds: Normal breath sounds. No wheezing, rhonchi or rales.  Abdominal:     Palpations: Abdomen is soft.     Comments: Soft, but distended- hypoactive BS  Musculoskeletal:     Cervical back: Neck supple. No tenderness.      Comments: Left prevena dressing in place with good seal and no output. Black stocking in place. R BKA healed- prosthesis in room Ue's 5/5 and RLE 5/5 ending in BKA LLE- at least 3/5 - has L BKA  Skin:    General: Skin is warm and dry.     Comments: VAC in place on LLE- no drainage in cannister, stable 2/6 Neurological:     General: No focal deficit present.     Mental Status: He is alert and oriented to person, place, and time.  Psychiatric:        Mood and Affect: Mood normal.        Thought Content: Thought content normal.   Assessment/Plan: 1. Functional deficits which require 3+ hours per day of interdisciplinary therapy in a comprehensive inpatient rehab setting. Physiatrist is providing close team supervision and 24 hour management of active  medical problems listed below. Physiatrist and rehab team continue to assess barriers to discharge/monitor patient progress toward functional and medical goals  Care Tool:  Bathing    Body parts bathed by patient: Right arm, Left arm, Chest, Abdomen   Body parts bathed by helper: Front perineal area, Buttocks, Right upper leg, Left upper leg, Face Body parts n/a: Right lower leg, Left lower leg (bilateral BKA)   Bathing assist Assist Level: Maximal Assistance - Patient 24 - 49%     Upper Body Dressing/Undressing Upper body dressing   What is the patient wearing?: Pull over shirt    Upper body assist Assist Level: Moderate Assistance - Patient 50 - 74%    Lower Body Dressing/Undressing Lower body dressing      What is the patient wearing?: Incontinence brief, Pants     Lower body assist Assist for lower body dressing: Total Assistance - Patient < 25%     Toileting Toileting    Toileting assist Assist for toileting: Total Assistance - Patient < 25%     Transfers Chair/bed transfer  Transfers assist  Chair/bed transfer activity did not occur: Safety/medical concerns (unsafet to get up)  Chair/bed transfer assist  level: Contact Guard/Touching assist (lateral scoot)     Locomotion Ambulation   Ambulation assist   Ambulation activity did not occur: Safety/medical concerns (pain, decreased balance on R prosthetic)          Walk 10 feet activity   Assist  Walk 10 feet activity did not occur: Safety/medical concerns (pain, decreased balance on R prosthetic)        Walk 50 feet activity   Assist Walk 50 feet with 2 turns activity did not occur: Safety/medical concerns (pain, decreased balance on R prosthetic)         Walk 150 feet activity   Assist Walk 150 feet activity did not occur: Safety/medical concerns (pain, decreased balance on R prosthetic)         Walk 10 feet on uneven surface  activity   Assist Walk 10 feet on uneven surfaces activity did not occur: Safety/medical concerns (pain, decreased balance on R prosthetic)         Wheelchair     Assist Is the patient using a wheelchair?: Yes Type of Wheelchair: Manual    Wheelchair assist level: Supervision/Verbal cueing Max wheelchair distance: 156ft    Wheelchair 50 feet with 2 turns activity    Assist        Assist Level: Supervision/Verbal cueing   Wheelchair 150 feet activity     Assist      Assist Level: Supervision/Verbal cueing   Blood pressure (!) 104/52, pulse 62, temperature 98.4 F (36.9 C), temperature source Oral, resp. rate 16, height 5' 10 (1.778 m), weight 65.4 kg, SpO2 95%.  Medical Problem List and Plan: 1. 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 Nonambulatory: 30mg  Lovenox  daily started   3. Pain Management: Tylenol , oxycodone  as needed   4. Mood/Behavior/Sleep: LCSW to evaluate and provide emotional support             -antipsychotic agents: n/a   5. Neuropsych/cognition: currently, patient is not  capable of  making decisions on his  own  behalf. Confused, but hasn't received pain meds today.    6. Skin/Wound Care: Routine skin care checks   7. Fluids/Electrolytes/Nutrition: Routine Is and Os and follow-up chemistries             -continue vit C, zinc  supplements   8: Hypertension: monitor TID and prn             -continue Lopressor  25 mg BID             -on multiple bladder meds   9: Hyperlipidemia: continue statin   10: ? OAB/? 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 Harden             -Prevena off on 2/07   13: DM: CBGs QID, A1c = 10%             -continue moderate scale SSI             -continue Novolog  4 units with meals             -continue Semglee  15 units at bedtime             -continue metformin  500 mg BID with meals  -provided list of foods for diabetes   14: History of bicuspid aortic valve, moderate AV stenosis   15: History of CAD s/p LHC with PCI/stent on 07/12/2018             -completed DAPT for 12 months; on statin             -follows with Dr. Court   16: Pancytopenia: follows with Dr. Lanny and gets B12 injection monthly             -follow-up CBC     17. Constipation- LBM before surgery- needs to get cleaned out but didn't want Sorbitol until seen by therapy.    18. Confusion: d/c oxycodone   19, Hypotension: changed lopressor  to Toprol  XL 25mg  HS, decrease dose to 12.5mg   20. Fatigue: B12 ordered IM on 2/5, will order metanx  21. Polypharmacy: advised wife that we will provide her with a list of patient's medications  LOS: 2 days A FACE TO FACE EVALUATION WAS PERFORMED  Shoaib Siefker P Kimberly Nieland 08/07/2023, 10:22 AM

## 2023-08-07 NOTE — Plan of Care (Signed)
  Problem: Consults Goal: RH LIMB LOSS PATIENT EDUCATION Description: Description: See Patient Education module for eduction specifics. Outcome: Progressing   Problem: RH BOWEL ELIMINATION Goal: RH STG MANAGE BOWEL WITH ASSISTANCE Description: STG Manage Bowel with mod I Assistance. Outcome: Progressing Goal: RH STG MANAGE BOWEL W/MEDICATION W/ASSISTANCE Description: STG Manage Bowel with Medication with mod I Assistance. Outcome: Progressing   Problem: RH BLADDER ELIMINATION Goal: RH STG MANAGE BLADDER WITH ASSISTANCE Description: STG Manage Bladder With mod I Assistance Outcome: Progressing Goal: RH STG MANAGE BLADDER WITH MEDICATION WITH ASSISTANCE Description: STG Manage Bladder With Medication With mod I Assistance. Outcome: Progressing   Problem: RH SKIN INTEGRITY Goal: RH STG SKIN FREE OF INFECTION/BREAKDOWN Description: Manage skin w min assist Outcome: Progressing   Problem: RH SAFETY Goal: RH STG ADHERE TO SAFETY PRECAUTIONS W/ASSISTANCE/DEVICE Description: STG Adhere to Safety Precautions With cues  Assistance/Device. Outcome: Progressing   Problem: RH PAIN MANAGEMENT Goal: RH STG PAIN MANAGED AT OR BELOW PT'S PAIN GOAL Description: Manage pain <4 with prns Outcome: Progressing   Problem: RH KNOWLEDGE DEFICIT LIMB LOSS Goal: RH STG INCREASE KNOWLEDGE OF SELF CARE AFTER LIMB LOSS Description: Patient and wife will be able to manage care at discharge using educational resources for medications and dietary modifications and skin care independently. Outcome: Progressing

## 2023-08-07 NOTE — Care Management (Signed)
 Inpatient Rehabilitation Center Individual Statement of Services  Patient Name:  Adam Ray  Date:  08/07/2023  Welcome to the Inpatient Rehabilitation Center.  Our goal is to provide you with an individualized program based on your diagnosis and situation, designed to meet your specific needs.  With this comprehensive rehabilitation program, you will be expected to participate in at least 3 hours of rehabilitation therapies Monday-Friday, with modified therapy programming on the weekends.  Your rehabilitation program will include the following services:  Physical Therapy (PT), Occupational Therapy (OT), Speech Therapy (ST), 24 hour per day rehabilitation nursing, Therapeutic Recreaction (TR), Psychology, Neuropsychology, Care Coordinator, Rehabilitation Medicine, Nutrition Services, Pharmacy Services, and Other  Weekly team conferences will be held on Wednesdays to discuss your progress.  Your Inpatient Rehabilitation Care Coordinator will talk with you frequently to get your input and to update you on team discussions.  Team conferences with you and your family in attendance may also be held.  Expected length of stay: 2 weeks    Overall anticipated outcome: Contact Guard/Supervision  Depending on your progress and recovery, your program may change. Your Inpatient Rehabilitation Care Coordinator will coordinate services and will keep you informed of any changes. Your Inpatient Rehabilitation Care Coordinator's name and contact numbers are listed  below.  The following services may also be recommended but are not provided by the Inpatient Rehabilitation Center:  Driving Evaluations Home Health Rehabiltiation Services Outpatient Rehabilitation Services Vocational Rehabilitation   Arrangements will be made to provide these services after discharge if needed.  Arrangements include referral to agencies that provide these services.  Your insurance has been verified to be:  [employer]  Your primary doctor is:  Laymon Legions  Pertinent information will be shared with your doctor and your insurance company.  Inpatient Rehabilitation Care Coordinator:  Adam Ray SILK [phone] or [phone]  Information discussed with and copy given to patient by: Adam Ray, 08/07/2023, 3:52 PM

## 2023-08-08 DIAGNOSIS — Z89512 Acquired absence of left leg below knee: Secondary | ICD-10-CM | POA: Diagnosis not present

## 2023-08-08 LAB — GLUCOSE, CAPILLARY
Glucose-Capillary: 246 mg/dL — ABNORMAL HIGH (ref 70–99)
Glucose-Capillary: 220 mg/dL — ABNORMAL HIGH (ref 70–99)
Glucose-Capillary: 133 mg/dL — ABNORMAL HIGH (ref 70–99)

## 2023-08-08 MED ORDER — IBUPROFEN 200 MG PO TABS
200.0000 mg | ORAL_TABLET | Freq: Four times a day (QID) | ORAL | Status: DC | PRN
  Administered 2023-08-08 – 2023-08-13 (×5): 200 mg via ORAL
  Filled 2023-08-08 (×6): qty 1

## 2023-08-08 MED ORDER — ACETAMINOPHEN 500 MG PO TABS
1000.0000 mg | ORAL_TABLET | Freq: Three times a day (TID) | ORAL | Status: DC
  Administered 2023-08-08 – 2023-08-23 (×39): 1000 mg via ORAL
  Filled 2023-08-08 (×42): qty 2

## 2023-08-08 MED ORDER — TRAMADOL HCL 50 MG PO TABS
50.0000 mg | ORAL_TABLET | Freq: Two times a day (BID) | ORAL | Status: DC | PRN
  Administered 2023-08-08 – 2023-08-16 (×8): 50 mg via ORAL
  Filled 2023-08-08 (×9): qty 1

## 2023-08-08 MED ORDER — INSULIN GLARGINE-YFGN 100 UNIT/ML ~~LOC~~ SOLN
16.0000 [IU] | Freq: Every day | SUBCUTANEOUS | Status: DC
  Administered 2023-08-08: 16 [IU] via SUBCUTANEOUS
  Filled 2023-08-08 (×2): qty 0.16

## 2023-08-08 NOTE — Progress Notes (Signed)
 Occupational Therapy Session Note  Patient Details  Name: Adam Ray MRN: [id number] Date of Birth: 1955-12-31  Today's Date: 08/08/2023 OT Individual Time: 1105-1200 OT Individual Time Calculation (min): 55 min    Short Term Goals: Week 1:  OT Short Term Goal 1 (Week 1): Pt will complete sit > stand in prep for ADL with mod A using LRAD OT Short Term Goal 2 (Week 1): Pt will complete 1/3 toileting steps with mod A for balance OT Short Term Goal 3 (Week 1): Pt will donn LB clothing with mod A OT Short Term Goal 4 (Week 1): Pt will complete toilet transfer with mod A using LRAD  Skilled Therapeutic Interventions/Progress Updates:  Skilled OT intervention completed with focus on activity tolerance, BUE strengthening, functional transfers. Pt received seated in w/c, agreeable to session. Unrated pain reported in Lt residual limb; pre-medicated. OT offered rest breaks and repositioning throughout for pain reduction.  Pt requested to brush teeth. Required extensive time do so due to low vision strategies and perseveration but overall supervision. Transported dependently in w/c > gym for time.  Seated in w/c, pt completed the following BUE exercises to promote BUE strength/endurance needed for independence with functional transfers and BADLs: (With red theraband) 10 reps Horizontal abduction Self-anchored bicep flexion each arm Self-anchored tricep extension each arm Shoulder external rotation Shoulder diagonal pulls  Pt self-propelled in w/c about 50 ft prior to fatigue with supervision however directional cues due to lower vision. Back in room, completed CGA lateral scoot transfer > EOB with cues for sequencing. Pt was able to doff both limb guard and prosthesis with + time but overall supervision. Transitioned to upright in bed with supervision. Pt remained upright in bed, with bed alarm on/activated, and with all needs in reach at end of session.   Therapy Documentation Precautions:   Precautions Precautions: Fall Precaution Comments: LLE Wound Vac Required Braces or Orthoses: Other Brace Other Brace: LLE Limb Guard Restrictions Weight Bearing Restrictions Per Provider Order: Yes LLE Weight Bearing Per Provider Order: Non weight bearing    Therapy/Group: Individual Therapy  Lorrayne FORBES Fritter, MS, OTR/L  08/08/2023, 12:13 PM

## 2023-08-08 NOTE — Plan of Care (Signed)
  Problem: RH BOWEL ELIMINATION Goal: RH STG MANAGE BOWEL WITH ASSISTANCE Description: STG Manage Bowel with mod I Assistance. Outcome: Progressing   Problem: RH BLADDER ELIMINATION Goal: RH STG MANAGE BLADDER WITH ASSISTANCE Description: STG Manage Bladder With mod I Assistance Outcome: Progressing   Problem: RH SKIN INTEGRITY Goal: RH STG SKIN FREE OF INFECTION/BREAKDOWN Description: Manage skin w min assist Outcome: Progressing

## 2023-08-08 NOTE — IPOC Note (Signed)
 Overall Plan of Care [facility]) Patient Details Name: Adam Ray MRN: [id number] DOB: 1956/01/17  Admitting Diagnosis: S/P BKA (below knee amputation) unilateral, left [facility])  Hospital Problems: Principal Problem:   S/P BKA (below knee amputation) unilateral, left (HCC) Active Problems:   S/P BKA (below knee amputation) (HCC)     Functional Problem List: Nursing Safety, Endurance, Medication Management, Bowel, Bladder, Pain, Skin Integrity  PT Balance, Behavior, Edema, Endurance, Motor, Nutrition, Pain, Perception, Safety, Sensory, Skin Integrity  OT Balance, Cognition, Edema, Endurance, Motor, Pain, Safety, Sensory, Skin Integrity, Vision  SLP    TR         Basic ADL's: OT Eating, Grooming, Bathing, Dressing, Toileting     Advanced  ADL's: OT Simple Meal Preparation     Transfers: PT Bed Mobility, Bed to Chair, Car, Occupational Psychologist, Research Scientist (life Sciences): PT Ambulation, Psychologist, Prison And [employer] Services, Stairs     Additional Impairments: OT None  SLP        TR      Anticipated Outcomes Item Anticipated Outcome  Self Feeding Set up A  Swallowing      Basic self-care  Min A  Toileting  Min A   Bathroom Transfers Min A  Bowel/Bladder  manage bowel  and bladder w mod I assist  Transfers  supervision with LRAD  Locomotion  min A with LRAD  Communication     Cognition     Pain  manage pain < 4 with prns  Safety/Judgment  manage safety w cues   Therapy Plan: PT Intensity: Minimum of 1-2 x/day ,45 to 90 minutes PT Frequency: 5 out of 7 days PT Duration Estimated Length of Stay: 2 weeks OT Intensity: Minimum of 1-2 x/day, 45 to 90 minutes OT Frequency: 5 out of 7 days OT Duration/Estimated Length of Stay: 2 weeks     Team Interventions: Nursing Interventions Medication Management, Bowel Management, Disease Management/Prevention, Bladder Management, Skin Care/Wound Management, Discharge Planning, Patient/Family Education, Pain Management  PT  interventions Ambulation/gait training, Discharge planning, Functional mobility training, Psychosocial support, Therapeutic Activities, Visual/perceptual remediation/compensation, Balance/vestibular training, Disease management/prevention, Neuromuscular re-education, Skin care/wound management, Therapeutic Exercise, Wheelchair propulsion/positioning, Cognitive remediation/compensation, DME/adaptive equipment instruction, Pain management, Splinting/orthotics, UE/LE Strength taining/ROM, Community reintegration, Development worker, international aid stimulation, Patient/family education, Museum/gallery curator, UE/LE Coordination activities  OT Interventions Warden/ranger, Discharge planning, Pain management, Self Care/advanced ADL retraining, Therapeutic Activities, UE/LE Coordination activities, Cognitive remediation/compensation, Disease mangement/prevention, Functional mobility training, Patient/family education, Skin care/wound managment, Therapeutic Exercise, Visual/perceptual remediation/compensation, DME/adaptive equipment instruction, Neuromuscular re-education, Psychosocial support, UE/LE Strength taining/ROM, Wheelchair propulsion/positioning  SLP Interventions    TR Interventions    SW/CM Interventions Discharge Planning, Psychosocial Support, Patient/Family Education   Barriers to Discharge MD  Medical stability  Nursing Decreased caregiver support, Home environment access/layout, Weight bearing restrictions 1 level ramped entry w spouse;wife works 20 hours a week, may look into having someone stay with patient when she is at work.Using RW vs cane for household distances, electric scooter for community distances  PT Decreased caregiver support, Wound Care, Weight bearing restrictions, Other (comments) pain, cognitive impairments, wife works during the day  OT Home environment access/layout, Decreased caregiver support, Wound Care, Other (comments) wound vac  SLP      SW Decreased caregiver support,  Lack of/limited family support, Community Education Officer for SNF coverage     Team Discharge Planning: Destination: PT-Home ,OT- Home , SLP-  Projected Follow-up: PT-Home health PT, OT-  Home health OT, SLP-  Projected Equipment Needs: PT-To be determined,  OT- To be determined, SLP-  Equipment Details: PT-has RW, electric scooter, and cane, OT-  Patient/family involved in discharge planning: PT- Patient, Family adult nurse,  OT-Patient, Family member/caregiver, SLP-   MD ELOS: 15 days Medical Rehab Prognosis:  Excellent Assessment: The patient has been admitted for CIR therapies with the diagnosis of left BKA. The team will be addressing functional mobility, strength, stamina, balance, safety, adaptive techniques and equipment, self-care, bowel and bladder mgt, patient and caregiver education. Goals have been set at supervision. Anticipated discharge destination is home.        See Team Conference Notes for weekly updates to the plan of care

## 2023-08-08 NOTE — Progress Notes (Signed)
 Inpatient Rehabilitation Care Coordinator Assessment and Plan Patient Details  Name: DAYTON KENLEY MRN: [id number] Date of Birth: 18-Jul-1955  Today's Date: 08/08/2023  Hospital Problems: Principal Problem:   S/P BKA (below knee amputation) unilateral, left (HCC) Active Problems:   S/P BKA (below knee amputation) (HCC)  Past Medical History:  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   Status post transmetatarsal amputation of foot, 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/2018   STEMI involving left circumflex coronary artery (HCC) 07/12/2018   Coronary artery disease   Subacute osteomyelitis, right ankle and foot (HCC) 01/29/2016   Testicular mass 04/18/2016   Past Surgical History:  Past Surgical History:  Procedure Laterality Date   AMPUTATION Right 01/31/2016   Procedure: Right 2nd Toe Amputation;  Surgeon: Jerona LULLA Sage, MD;  Location: [facility] OR;  Service: Orthopedics;  Laterality: Right;   AMPUTATION Right 07/08/2018   Procedure: RIGHT TRANSMETATARSAL AMPUTATION;  Surgeon: Sage Jerona LULLA, MD;  Location: [facility] OR;  Service: Orthopedics;  Laterality: Right;   AMPUTATION Right 01/05/2020   Procedure: RIGHT BELOW KNEE AMPUTATION;  Surgeon: Sage Jerona LULLA, MD;  Location: [facility] OR;  Service: Orthopedics;  Laterality: Right;   AMPUTATION Right 02/23/2020   Procedure: RIGHT BELOW KNEE AMPUTATION REVISION;  Surgeon: Sage Jerona LULLA, MD;  Location: [facility] OR;  Service: Orthopedics;  Laterality: Right;    AMPUTATION Left 10/11/2022   Procedure: LEFT GREAT TOE AMPUTATION AND LEFT 2ND TOE AMPUTATION;  Surgeon: Sage Jerona LULLA, MD;  Location: [facility] OR;  Service: Orthopedics;  Laterality: Left;   AMPUTATION Left 12/25/2022   Procedure: LEFT TRANSMETATARSAL AMPUTATION;  Surgeon: Sage Jerona LULLA, MD;  Location: [facility] OR;  Service: Orthopedics;  Laterality: Left;   AMPUTATION Left 08/01/2023   Procedure: LEFT BELOW KNEE AMPUTATION;  Surgeon: Sage Jerona LULLA, MD;  Location: [facility] OR;  Service: Orthopedics;  Laterality: Left;   CATARACT EXTRACTION     right eye   COLONOSCOPY     CORONARY STENT INTERVENTION N/A 07/12/2018   Procedure: CORONARY STENT INTERVENTION;  Surgeon: Burnard Debby LABOR, MD;  Location: [facility] INVASIVE CV LAB;  Service: Cardiovascular;  Laterality: N/A;   CORONARY/GRAFT ACUTE MI REVASCULARIZATION N/A 07/12/2018   Procedure: Coronary/Graft Acute MI Revascularization;  Surgeon: Burnard Debby LABOR, MD;  Location: [facility] INVASIVE CV LAB;  Service: Cardiovascular;  Laterality: N/A;   I & D EXTREMITY  04/11/2012   Procedure: IRRIGATION AND DEBRIDEMENT EXTREMITY;  Surgeon: Norleen Armor, MD;  Location: [facility] OR;  Service: Orthopedics;  Laterality: Right;   LEFT HEART CATH AND CORONARY ANGIOGRAPHY N/A 07/12/2018   Procedure: LEFT HEART CATH AND CORONARY ANGIOGRAPHY;  Surgeon: Burnard Debby LABOR, MD;  Location: [facility] INVASIVE CV LAB;  Service: Cardiovascular;  Laterality: N/A;   Surgery left great toe     Tear ducts bilateral eyes  TRANSMETATARSAL AMPUTATION Right 07/08/2018   Social History:  reports that he quit smoking about 4 years ago. His smoking use included cigars and cigarettes. He started smoking about 60 years ago. He has a 16.8 pack-year smoking history. He has been exposed to tobacco smoke. He has quit using smokeless tobacco. He reports that he does not currently use alcohol. He reports that he does not use drugs.  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 Education: college Health Literacy - How often do you need to have someone help you when you read instructions, pamphlets, or other written material from your doctor or pharmacy?: Never Writes: Yes Employment Status: Retired Marine Scientist Issues: Denies Guardian/Conservator: Denies   Abuse/Neglect Abuse/Neglect Assessment Can Be Completed: Yes Physical Abuse: Denies Verbal Abuse: Denies Sexual Abuse: Denies Exploitation of patient/patient's resources: Denies Self-Neglect: Denies  Patient response to: Social Isolation - How often do you feel lonely or isolated from those around you?: Rarely  Emotional Status Pt's affect, behavior and adjustment status: Pt in good spirits at time of visit Recent Psychosocial Issues: Denies Psychiatric History: Denies Substance Abuse History: Denies  Patient / Family Perceptions, Expectations & Goals Pt/Family understanding of illness & functional limitations: Pt and wife have a general understanding of care needs Premorbid pt/family roles/activities: Independent Anticipated changes in roles/activities/participation: Assistance with ADLs/IADLs Pt/family expectations/goals: pt goal is to work towards getting through  [employer]: None Premorbid Home Care/DME Agencies: None Transportation available at discharge: TBD Is the patient able to respond to transportation needs?: Yes In the past 12 months, has lack of transportation  kept you from medical appointments or from getting medications?: No In the past 12 months, has lack of transportation kept you from meetings, work, or from getting things needed for daily living?: No Resource referrals recommended: Neuropsychology  Discharge Planning Living Arrangements: Spouse/significant other Support Systems: Spouse/significant other Type of Residence: Private residence Insurance Resources: Media Planner (specify) ([place] ([place] U[place] (secondary)) Financial Resources: Restaurant Manager, Fast Food Screen Referred: No Living Expenses: Banker Management: Patient, Spouse Does the patient have any problems obtaining your medications?: No Home Management: Pt wife manages all home care needs Patient/Family Preliminary Plans: no changes Care Coordinator Barriers to Discharge: Decreased caregiver support, Lack of/limited family support, Insurance for SNF coverage Care Coordinator Anticipated Follow Up Needs: HH/OP  Clinical Impression SW met with pt at bedside to introduce self, explain role, and discuss discharge process. Pt is not a cytogeneticist. No HCPOA. DME: w/c, shower chair, RW, ramped entrance, and scooter (in room).   Audie Wieser A Ryen Rhames 08/08/2023, 10:18 AM

## 2023-08-08 NOTE — Progress Notes (Signed)
 Orthopedic Tech Progress Note Patient Details:  STELLAN VICK 09-Oct-1955 [id number] Called in order to [facility] for Shrinkers Patient ID: RIAN BUSCHE, male   DOB: Jan 12, 1956, 68 y.o.   MRN: [id number]  Delynn Fill 08/08/2023, 8:34 AM

## 2023-08-08 NOTE — Progress Notes (Signed)
 Physical Therapy Session Note  Patient Details  Name: Adam Ray MRN: [id number] Date of Birth: 10-Mar-1956  Today's Date: 08/08/2023 PT Individual Time: [phone] and [phone] PT Individual Time Calculation (min): 56 min and 70 min  Short Term Goals: Week 1:  PT Short Term Goal 1 (Week 1): pt will perform bed mobility with min A PT Short Term Goal 2 (Week 1): pt will transfer bed<>chair with LRAD and mod A PT Short Term Goal 3 (Week 1): pt will transfer sit<>stand with LRAD and mod A  Skilled Therapeutic Interventions/Progress Updates:   Treatment Session 1 Received pt semi-reclined in bed, pt agreeable to PT treatment, and reported pain 10/10 in R residual limb - RN notified of request for pain medicine. Session with emphasis on functional mobility/transfers, generalized strengthening and endurance, dynamic standing balance/coordination. Donned scrub shorts in supine with supervision and increased time to get legs through - rolled L/R using bedrails with supervision and required max A to pull shorts over hips. Donned limb guard with min A and increased time - provided pt with handout on limb guard education. Pt transferred semi-reclined<>sitting L EOB with HOB slightly elevated and use of bedrails with supervision and increased time/effort. RN present to adminster medications and pt donned gel liner and prosthetic with set up assist and increased time.   Worked on blocked practice sit<>stands from elevated EOB with RW and light max A x 5 trials pushing up with BUE support on RW. Worked on eccentric control and reaching back with RUE when sitting to control descent - pt required mod A fading to light min A for eccentric control. On final stand, worked on boosting up with BUE on RW x3 reps to attempt to clear prosthetic; pt able to minimally clear foot but not enough for stand<>pivot transfer. Performed lateral scoot bed<>WC to R with CGA and assist to stabilize WC. Educated pt on importance of  maintaining knee extension. Concluded session with pt sitting in WC, needs within reach, and seatbelt alarm on.   Treatment Session 2 Received pt sitting in WC, pt agreeable to PT treatment, and reported pain 10/10 in R residual limb - RN/MD notified and adjusting medications in preparation for wound vac removal today. Session with emphasis on functional mobility/transfers, generalized strengthening and endurance, dynamic standing balance/coordination, and WC mobility. Pt continues to require increased time with mobility and frequent rest breaks due to fatigue/deconditioning. Pt performed WC mobility 168ft using BUE and supervision with increased time to dayroom with emphasis on BUE strength - cues for propulsion efficiency but WC continues to veer to R. Educated pt on transfer set up and removing legrests/flip back armrests with mod cues and hand over hand guidance (pt needs continued practice). Pt transferred WC<>mat via lateral scoot with CGA and assist to stabilize WC.  Stood from 22in high mat with RW and max A and from 23in high mat with RW and mod A x 2 trials and worked on dynamic standing balance/coordination and standing tolerance tossing horseshoes using RUE x 3 trials with min A for balance - cues to reach back with RUE when sitting with emphasis on eccentric control. Removed limb guard and pt performed seated L knee extension 2x15 and L hip flexion 2x15 with emphasis on LE strength/ROM followed by bicep curls with 4lb dumbbell 2x15 with emphasis on UE strength. During rest breaks discussed DME for D/C with recommendation for lightweight (K4/K5) WC - discussed with CSW and took appropriate measurements and values needed for justification. Donned limb  guard and transferred mat<>WC via lateral scoot with CGA. Transported back to room in [facility] dependently and concluded session with pt sitting in WC, needs within reach, and seatbelt alarm on.   Therapy Documentation Precautions:   Precautions Precautions: Fall Precaution Comments: LLE Wound Vac Required Braces or Orthoses: Other Brace Other Brace: LLE Limb Guard Restrictions Weight Bearing Restrictions Per Provider Order: Yes LLE Weight Bearing Per Provider Order: Non weight bearing  Therapy/Group: Individual Therapy Therisa HERO Zaunegger Therisa Stains PT, DPT 08/08/2023, 6:51 AM

## 2023-08-08 NOTE — Progress Notes (Signed)
 Per orders instructed to wash with VASHE upon removing wound vac. VASHE has not been sent up by pharmacy at this time. When calling pharmacy, this nurse was told by the pharmacist that VASHE is not something that is stocked by them. Will pass on to night shift nurse to follow up with care coordinator in the AM to find VASHE. Care ongoing.     Saide Lanuza  VEAR Miyamoto, LPN

## 2023-08-08 NOTE — Progress Notes (Signed)
 PROGRESS NOTE   Subjective/Complaints: Placed order for wound vac removal [place]  LPN will contact wife to see if she is ok for us  to give dilaudid  prior to removal, he has hallucinations with codeine   ROS: +visual deficits, +hallucinations in response to codeine   Objective:   No results found. Recent Labs    08/06/23 0517  WBC 4.8  HGB 8.3*  HCT 24.6*  PLT 165   Recent Labs    08/06/23 0517  NA 132*  K 4.1  CL 95*  CO2 27  GLUCOSE 272*  BUN 20  CREATININE 0.92  CALCIUM  8.6*    Intake/Output Summary (Last 24 hours) at 08/08/2023 1026 Last data filed at 08/08/2023 0727 Gross per 24 hour  Intake 596 ml  Output 775 ml  Net -179 ml        Physical Exam: Vital Signs Blood pressure 102/65, pulse 70, temperature 98.6 F (37 C), resp. rate 16, height 5' 10 (1.778 m), weight 65.4 kg, SpO2 96%. Constitutional:      General: He is not in acute distress.    Comments: Confused- not able to dial wife's number on phone, sitting up in bed- scooter at bedside- not wearing R prosthesis; NAD  HENT:     Head: Normocephalic and atraumatic.     Comments: No teeth    Right Ear: External ear normal.     Left Ear: External ear normal.     Nose: Nose normal. No congestion.     Mouth/Throat:     Mouth: Mucous membranes are dry.     Pharynx: Oropharynx is clear. No oropharyngeal exudate.  Eyes:     General:        Right eye: No discharge.        Left eye: No discharge.     Extraocular Movements: Extraocular movements intact.  Cardiovascular:     Rate and Rhythm: Normal rate and regular rhythm.     Heart sounds: Normal heart sounds. No murmur heard.    No gallop.  Pulmonary:     Effort: Pulmonary effort is normal. No respiratory distress.     Breath sounds: Normal breath sounds. No wheezing, rhonchi or rales.  Abdominal:     Palpations: Abdomen is soft.     Comments: Soft, but distended- hypoactive BS  Musculoskeletal:      Cervical back: Neck supple. No tenderness.     Comments: Left prevena dressing in place with good seal and no output. Black stocking in place. R BKA healed- prosthesis in room Ue's 5/5 and RLE 5/5 ending in BKA LLE- at least 3/5 - has L BKA  Skin:    General: Skin is warm and dry.     Comments: VAC in place on LLE- no drainage in cannister, stable 2/7 Neurological:     General: No focal deficit present.     Mental Status: He is alert and oriented to person, place, and time.  Psychiatric:        Mood and Affect: Mood normal.        Thought Content: Thought content normal.   Assessment/Plan: 1. Functional deficits which require 3+ hours per day of interdisciplinary therapy in a comprehensive  inpatient rehab setting. Physiatrist is providing close team supervision and 24 hour management of active medical problems listed below. Physiatrist and rehab team continue to assess barriers to discharge/monitor patient progress toward functional and medical goals  Care Tool:  Bathing    Body parts bathed by patient: Right arm, Left arm, Chest, Abdomen, Face   Body parts bathed by helper: Front perineal area, Buttocks, Right upper leg, Left upper leg, Face Body parts n/a: Right lower leg, Left lower leg (bilateral BKA)   Bathing assist Assist Level: Supervision/Verbal cueing     Upper Body Dressing/Undressing Upper body dressing   What is the patient wearing?: Pull over shirt    Upper body assist Assist Level: Moderate Assistance - Patient 50 - 74%    Lower Body Dressing/Undressing Lower body dressing      What is the patient wearing?: Incontinence brief, Pants     Lower body assist Assist for lower body dressing: Total Assistance - Patient < 25%     Toileting Toileting    Toileting assist Assist for toileting: Maximal Assistance - Patient 25 - 49%     Transfers Chair/bed transfer  Transfers assist  Chair/bed transfer activity did not occur: Safety/medical concerns  (unsafet to get up)  Chair/bed transfer assist level: Contact Guard/Touching assist (lateral scoot)     Locomotion Ambulation   Ambulation assist   Ambulation activity did not occur: Safety/medical concerns (pain, decreased balance on R prosthetic)          Walk 10 feet activity   Assist  Walk 10 feet activity did not occur: Safety/medical concerns (pain, decreased balance on R prosthetic)        Walk 50 feet activity   Assist Walk 50 feet with 2 turns activity did not occur: Safety/medical concerns (pain, decreased balance on R prosthetic)         Walk 150 feet activity   Assist Walk 150 feet activity did not occur: Safety/medical concerns (pain, decreased balance on R prosthetic)         Walk 10 feet on uneven surface  activity   Assist Walk 10 feet on uneven surfaces activity did not occur: Safety/medical concerns (pain, decreased balance on R prosthetic)         Wheelchair     Assist Is the patient using a wheelchair?: Yes Type of Wheelchair: Manual    Wheelchair assist level: Supervision/Verbal cueing Max wheelchair distance: 155ft    Wheelchair 50 feet with 2 turns activity    Assist        Assist Level: Supervision/Verbal cueing   Wheelchair 150 feet activity     Assist      Assist Level: Supervision/Verbal cueing   Blood pressure 102/65, pulse 70, temperature 98.6 F (37 C), resp. rate 16, height 5' 10 (1.778 m), weight 65.4 kg, SpO2 96%.  Medical Problem List and Plan: 1. Functional deficits secondary to L BKA in setting of R BKA 2/2 Left heel ulcer             -patient may not  shower while VAC in place             -ELOS/Goals: 10-12 days at w/c level  IPOC completed   2. Nonambulatory: 30mg  Lovenox  daily started, discussed with patient that goal is to discharge at wheelchair level   3. Pain Management: Tylenol , oxycodone  d/ced due to codeine  allergy   4. Hallucinations: discussed with wife that these are  a side effect to codeine , oxycodone  d/ced  5. Neuropsych/cognition: currently, patient is not  capable of making decisions on his  own behalf. Confused, but hasn't received pain meds today.    6. Skin/Wound Care: Routine skin care checks   7. Fluids/Electrolytes/Nutrition: Routine Is and Os and follow-up chemistries             -continue vit C, zinc  supplements   8: Hypertension: monitor TID and prn             -continue Lopressor  25 mg BID             -on multiple bladder meds   9: Hyperlipidemia: continue statin   10: ? OAB/? 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 Harden             -Prevena off on 2/07   13: DM: CBGs QID, A1c = 10%             -continue moderate scale SSI             -continue Novolog  4 units with meals             -continue Semglee  15 units at bedtime             -continue metformin  500 mg BID with meals  -provided list of foods for diabetes   14: History of bicuspid aortic valve, moderate AV stenosis   15: History of CAD s/Adam LHC with PCI/stent on 07/12/2018             -completed DAPT for 12 months; on statin             -follows with Dr. Court   16: Pancytopenia: follows with Dr. Lanny and gets B12 injection monthly             -follow-up CBC     17. Constipation- LBM before surgery- needs to get cleaned out but didn't want Sorbitol until seen by therapy.    18. Confusion: d/c oxycodone   19, Hypotension: changed lopressor  to Toprol  XL 25mg  HS, decrease dose to 12.5mg   20. Fatigue: B12 ordered IM on 2/5, will order metanx  21. Polypharmacy: advised wife that we will provide her with a list of patient's medications  LOS: 3 days A FACE TO FACE EVALUATION WAS PERFORMED  Adam Ray Adam Ray 08/08/2023, 10:26 AM
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
## 2023-08-09 DIAGNOSIS — I739 Peripheral vascular disease, unspecified: Secondary | ICD-10-CM | POA: Diagnosis not present

## 2023-08-09 DIAGNOSIS — Z794 Long term (current) use of insulin: Secondary | ICD-10-CM

## 2023-08-09 DIAGNOSIS — E118 Type 2 diabetes mellitus with unspecified complications: Secondary | ICD-10-CM | POA: Diagnosis not present

## 2023-08-09 DIAGNOSIS — Z89512 Acquired absence of left leg below knee: Secondary | ICD-10-CM | POA: Diagnosis not present

## 2023-08-09 DIAGNOSIS — K5901 Slow transit constipation: Secondary | ICD-10-CM | POA: Diagnosis not present

## 2023-08-09 LAB — GLUCOSE, CAPILLARY
Glucose-Capillary: 224 mg/dL — ABNORMAL HIGH (ref 70–99)
Glucose-Capillary: 132 mg/dL — ABNORMAL HIGH (ref 70–99)
Glucose-Capillary: 177 mg/dL — ABNORMAL HIGH (ref 70–99)

## 2023-08-09 MED ORDER — INSULIN GLARGINE-YFGN 100 UNIT/ML ~~LOC~~ SOLN
20.0000 [IU] | Freq: Every day | SUBCUTANEOUS | Status: DC
  Administered 2023-08-09 – 2023-08-10 (×2): 20 [IU] via SUBCUTANEOUS
  Filled 2023-08-09 (×3): qty 0.2

## 2023-08-09 MED ORDER — SENNOSIDES-DOCUSATE SODIUM 8.6-50 MG PO TABS
2.0000 | ORAL_TABLET | Freq: Every day | ORAL | Status: DC
  Administered 2023-08-09 – 2023-08-20 (×10): 2 via ORAL
  Filled 2023-08-09 (×13): qty 2

## 2023-08-09 NOTE — Progress Notes (Signed)
 Physical Therapy Session Note  Patient Details  Name: Adam Ray MRN: [id number] Date of Birth: 01-05-1956  Today's Date: 08/09/2023 PT Individual Time: 8884-8844 PT Individual Time Calculation (min): 40 min   Short Term Goals: Week 1:  PT Short Term Goal 1 (Week 1): pt will perform bed mobility with min A PT Short Term Goal 2 (Week 1): pt will transfer bed<>chair with LRAD and mod A PT Short Term Goal 3 (Week 1): pt will transfer sit<>stand with LRAD and mod A  Skilled Therapeutic Interventions/Progress Updates:    Chart reviewed and pt agreeable to therapy. Pt received semi-reclined in bed with no c/o pain. Session focused on functional transfers and WC mobility to promote safe home access. Pt initiated session with donning of limb guard and R prosthetic with S for limb guard. Pt then completed SPT to WC using minA + RW. Pt noted to have poor safety awareness during stand to sit and required VC to ensure pt lined up to chair before sitting. Pt then completed management of WC requiring strong VC for leg rest management. Pt repeated leg rest management, but continued to require strong VC.  Pt then completed 146ft of WC mobility with S. Pt then returned to room and practiced slideboard transfer back to bed. Pt required minA to safely set up slideboard and close S/periodic CGA during transfer. Pt then completed blocked practice of sit to stand requiring minA with elevated bed and VC for safe hand placement. At end of session, pt was left semi-reclined in bed with alarm engaged, nurse call bell and all needs in reach.     Therapy Documentation Precautions:  Precautions Precautions: Fall Precaution Comments: LLE Wound Vac Required Braces or Orthoses: Other Brace Other Brace: LLE Limb Guard Restrictions Weight Bearing Restrictions Per Provider Order: Yes LLE Weight Bearing Per Provider Order: Non weight bearing General:     Therapy/Group: Individual Therapy  Warrick KANDICE Raspberry, PT,  DPT 08/09/2023, 12:12 PM

## 2023-08-09 NOTE — Progress Notes (Signed)
 Wound vac removed per MD orders. Pt tolerated removal well. Minimal sanguineous drainage output from BKA site. Cleansed BKA with VASHE solution. Shrinker applied to BKA. Pt tolerated shrinker well. Care ongoing.     Adam Ray  Verline Glow, LPN

## 2023-08-09 NOTE — Plan of Care (Signed)
  Problem: Consults Goal: RH LIMB LOSS PATIENT EDUCATION Description: Description: See Patient Education module for eduction specifics. 08/09/2023 0656 by Rhett Durwood BRAVO, RN Outcome: Progressing 08/09/2023 0654 by Rhett Durwood BRAVO, RN Outcome: Progressing   Problem: RH BOWEL ELIMINATION Goal: RH STG MANAGE BOWEL WITH ASSISTANCE Description: STG Manage Bowel with mod I Assistance. 08/09/2023 0656 by Rhett Durwood BRAVO, RN Outcome: Progressing 08/09/2023 0654 by Rhett Durwood BRAVO, RN Outcome: Progressing   Problem: RH BLADDER ELIMINATION Goal: RH STG MANAGE BLADDER WITH ASSISTANCE Description: STG Manage Bladder With mod I Assistance Outcome: Progressing

## 2023-08-09 NOTE — Progress Notes (Signed)
 Physical Therapy Session Note  Patient Details  Name: Adam Ray MRN: [id number] Date of Birth: Aug 27, 1955  Today's Date: 08/09/2023 PT Individual Time: 0730-0827 PT Individual Time Calculation (min): 57 min   Short Term Goals: Week 1:  PT Short Term Goal 1 (Week 1): pt will perform bed mobility with min A PT Short Term Goal 2 (Week 1): pt will transfer bed<>chair with LRAD and mod A PT Short Term Goal 3 (Week 1): pt will transfer sit<>stand with LRAD and mod A  Skilled Therapeutic Interventions/Progress Updates:   Received pt semi-reclined in bed finishing breakfast. Pt reported pain 8/10 in L residual limb - LPN notified. Per RN, VASHE delivered last night and plan for wound vac removal today. Pt reported feeling worn out from therapy this week and very tired this morning. Donned scrub pants with supervision, then rolled L/R with supervision and use of bedrails, and required mod A to pull pants over hips. Donned limb guard with max A for time management purposes - noted small skin tear on lateral thigh and foam dressing applied. MD arrived for morning rounds, then when prepping for OOB mobility pt refusing due to fatigue stating, I don't wanna do therapy today, I don't wanna leave this bed due to fatigue. Referred to schedule and informed pt that he should have rest day tomorrow. With gentle encouragement, pt agreed to bed level exercises and performed the following exercises with emphasis on LE strength, ROM, and contracture prevention: -SLR 2x10 bilaterally -hip abduction 2x10 bilaterally   Educated/discussed with pt the following resources and placed handouts in patient resource notebook: -home measurement sheet for wife to complete -shrinker care guidelines for after wound vac removal -general rehabilitation timeline -contracture prevention LPN arrived to remove wound vac. Upon removal noted mild/moderate bright red drainage with 2 areas of dehiscence along L and R lateral aspects  of insicion along with warmness and discoloration along distal aspect of limb. Provided shrinker for LPN and pt left in care of LPN due to time restrictions.   Therapy Documentation Precautions:  Precautions Precautions: Fall Precaution Comments: LLE Wound Vac Required Braces or Orthoses: Other Brace Other Brace: LLE Limb Guard Restrictions Weight Bearing Restrictions Per Provider Order: Yes LLE Weight Bearing Per Provider Order: Non weight bearing  Therapy/Group: Individual Therapy Therisa HERO Zaunegger Therisa Stains PT, DPT 08/09/2023, 6:51 AM

## 2023-08-09 NOTE — Progress Notes (Signed)
 PROGRESS NOTE   Subjective/Complaints: Pt's vac removed today. Waiting on VASHE solution which was available today. Pt fatigued from therapy but participating. Stump pain controlled  ROS: Patient denies fever, rash, sore throat, blurred vision, dizziness, nausea, vomiting, diarrhea, cough, shortness of breath or chest pain,  Objective:   No results found. No results for input(s): WBC, HGB, HCT, PLT in the last 72 hours.  No results for input(s): NA, K, CL, CO2, GLUCOSE, BUN, CREATININE, CALCIUM  in the last 72 hours.   Intake/Output Summary (Last 24 hours) at 08/09/2023 1053 Last data filed at 08/09/2023 0700 Gross per 24 hour  Intake 700 ml  Output 900 ml  Net -200 ml        Physical Exam: Vital Signs Blood pressure (!) 111/53, pulse 63, temperature 97.9 F (36.6 C), temperature source Oral, resp. rate 17, height 5' 10 (1.778 m), weight 65.4 kg, SpO2 97%. Constitutional: No distress . Vital signs reviewed. HEENT: NCAT, EOMI, oral membranes moist Neck: supple Cardiovascular: RRR without murmur. No JVD    Respiratory/Chest: CTA Bilaterally without wheezes or rales. Normal effort    GI/Abdomen: BS +, non-tender, non-distended Ext: no clubbing, cyanosis, or edema Psych: pleasant and cooperative  Musculoskeletal:     Cervical back: Neck supple. No tenderness.       Ue's 5/5 and RLE 5/5 ending in BKA LLE- at least 3/5 - has L BKA  Skin:    General: Skin is warm and dry.     Comments: VAC in place on LLE- when I examined him this morning  Right BKA well healed. Neurological:     General: No focal deficit present.     Mental Status: He is alert and oriented to person, place, and time. Seems to be cognitively appropriate on a basic level. No focal weakness.      Assessment/Plan: 1. Functional deficits which require 3+ hours per day of interdisciplinary therapy in a comprehensive inpatient rehab  setting. Physiatrist is providing close team supervision and 24 hour management of active medical problems listed below. Physiatrist and rehab team continue to assess barriers to discharge/monitor patient progress toward functional and medical goals  Care Tool:  Bathing    Body parts bathed by patient: Right arm, Left arm, Chest, Abdomen, Face   Body parts bathed by helper: Front perineal area, Buttocks, Right upper leg, Left upper leg, Face Body parts n/a: Right lower leg, Left lower leg (bilateral BKA)   Bathing assist Assist Level: Supervision/Verbal cueing     Upper Body Dressing/Undressing Upper body dressing   What is the patient wearing?: Pull over shirt    Upper body assist Assist Level: Moderate Assistance - Patient 50 - 74%    Lower Body Dressing/Undressing Lower body dressing      What is the patient wearing?: Incontinence brief, Pants     Lower body assist Assist for lower body dressing: Total Assistance - Patient < 25%     Toileting Toileting    Toileting assist Assist for toileting: Maximal Assistance - Patient 25 - 49%     Transfers Chair/bed transfer  Transfers assist  Chair/bed transfer activity did not occur: Safety/medical concerns (unsafet to get up)  Chair/bed transfer assist level: Contact Guard/Touching assist (lateral scoot)     Locomotion Ambulation   Ambulation assist   Ambulation activity did not occur: Safety/medical concerns (pain, decreased balance on R prosthetic)          Walk 10 feet activity   Assist  Walk 10 feet activity did not occur: Safety/medical concerns (pain, decreased balance on R prosthetic)        Walk 50 feet activity   Assist Walk 50 feet with 2 turns activity did not occur: Safety/medical concerns (pain, decreased balance on R prosthetic)         Walk 150 feet activity   Assist Walk 150 feet activity did not occur: Safety/medical concerns (pain, decreased balance on R prosthetic)          Walk 10 feet on uneven surface  activity   Assist Walk 10 feet on uneven surfaces activity did not occur: Safety/medical concerns (pain, decreased balance on R prosthetic)         Wheelchair     Assist Is the patient using a wheelchair?: Yes Type of Wheelchair: Manual    Wheelchair assist level: Supervision/Verbal cueing Max wheelchair distance: 18ft    Wheelchair 50 feet with 2 turns activity    Assist        Assist Level: Supervision/Verbal cueing   Wheelchair 150 feet activity     Assist      Assist Level: Supervision/Verbal cueing   Blood pressure (!) 111/53, pulse 63, temperature 97.9 F (36.6 C), temperature source Oral, resp. rate 17, height 5' 10 (1.778 m), weight 65.4 kg, SpO2 97%.  Medical Problem List and Plan: 1. 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             -ELOS/Goals: 10-12 days at w/c level  IPOC completed   -Continue CIR therapies including PT, OT  2. Nonambulatory: 30mg  Lovenox  daily started, discussed with patient that goal is to discharge at wheelchair level   3. Pain Management: Tylenol , oxycodone  d/ced due to codeine  allergy   4. Hallucinations: discussed with wife that these are a side effect to codeine , oxycodone  d/ced   5. Neuropsych/cognition: currently, patient is not  capable of making decisions on his  own behalf. Confused, but hasn't received pain meds today.    6. Skin/Wound Care: Routine skin care checks   7. Fluids/Electrolytes/Nutrition: Routine Is and Os and follow-up chemistries             -continue vit C, zinc  supplements   8: Hypertension: monitor TID and prn             -continue Lopressor  25 mg BID             -on multiple bladder meds   9: Hyperlipidemia: continue statin   10: ? OAB/? 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 Harden             -Prevena off on 2/07--VASHE solution applied---shrinker  -wound appears clean with s/s drainage   13: DM: CBGs QID, A1c = 10%             -continue moderate scale SSI             -continue Novolog  4  units with meals             -continue Semglee  15 units at bedtime             -continue metformin  500 mg BID with meals  -provided list of foods for diabetes   CBG (last 3)  Recent Labs    08/08/23 0603 08/08/23 1213 08/08/23 1615  GLUCAP 246* 220* 133*    2/8 cbg's uncontrolled. Increase semglee  to 20u at bedtime 14: History of bicuspid aortic valve, moderate AV stenosis   15: History of CAD s/p LHC with PCI/stent on 07/12/2018             -completed DAPT for 12 months; on statin             -follows with Dr. Court   16: Pancytopenia: follows with Dr. Lanny and gets B12 injection monthly             -follow-up CBC     17. Constipation- LBM 2/5--needs bm this weekend.    -add senna to colace 18. Confusion: d/c oxycodone ---improved?  19, Hypotension: changed lopressor  to Toprol  XL 25mg  HS, decrease dose to 12.5mg   20. Fatigue: B12 ordered IM on 2/5, will order metanx  21. Polypharmacy: advised wife that we will provide her with a list of patient's medications  LOS: 4 days A FACE TO FACE EVALUATION WAS PERFORMED  Arthea ONEIDA Gunther 08/09/2023, 10:53 AM

## 2023-08-09 NOTE — Progress Notes (Signed)
 Occupational Therapy Session Note  Patient Details  Name: Adam Ray MRN: [id number] Date of Birth: 1955/08/21  Today's Date: 08/09/2023 OT Individual Time: 0920-1000 OT Individual Time Calculation (min): 40 min    Short Term Goals: Week 1:  OT Short Term Goal 1 (Week 1): Pt will complete sit > stand in prep for ADL with mod A using LRAD OT Short Term Goal 2 (Week 1): Pt will complete 1/3 toileting steps with mod A for balance OT Short Term Goal 3 (Week 1): Pt will donn LB clothing with mod A OT Short Term Goal 4 (Week 1): Pt will complete toilet transfer with mod A using LRAD  Skilled Therapeutic Interventions/Progress Updates:     Pt received resting in bed presenting to be in good spirits, however to be fatigued following previous PT session and having wound VAC removed earlier this AM. Pt receptive to skilled OT session with mod encouragement +increased time and education on importance of participating in therapy sessions to increase safety, endurance, independence, and strength for d/c home. Pt reporting 0/10 pain- OT offering intermittent rest breaks, repositioning, and therapeutic support to optimize participation in therapy session. Session completed in Pt's room to support moral and participation following Pt request. Pt transitioned to EOB using bed rail with supervision. Sitting EOB, engaged Pt in completing the following exercises to increase B UE strength and trunk control for increased safety and independence in BADLs and functional transfers:  -2x10 Shoulder flexion using 3# weighted dowel -2x10 AP Chest press using 3# weighted dowel -2x10 Bicep curls using 3# weighed dowel -2x12 Cross body punches to target unweighted -2x12 Lateral leans R/L -2x10 reps Vertical rows using 3# weighted dowel Pt requesting to return to bed at end of session. EOB>supine supervision using bed rails. Pt was left resting in bed with call bell in reach, bed alarm on, and all needs met.    Therapy  Documentation Precautions:  Precautions Precautions: Fall Precaution Comments: LLE Wound Vac Required Braces or Orthoses: Other Brace Other Brace: LLE Limb Guard Restrictions Weight Bearing Restrictions Per Provider Order: Yes LLE Weight Bearing Per Provider Order: Non weight bearing   Therapy/Group: Individual Therapy  [name] 08/09/2023, 7:59 AM

## 2023-08-10 DIAGNOSIS — Z89512 Acquired absence of left leg below knee: Secondary | ICD-10-CM | POA: Diagnosis not present

## 2023-08-10 DIAGNOSIS — K5901 Slow transit constipation: Secondary | ICD-10-CM | POA: Diagnosis not present

## 2023-08-10 DIAGNOSIS — I739 Peripheral vascular disease, unspecified: Secondary | ICD-10-CM | POA: Diagnosis not present

## 2023-08-10 DIAGNOSIS — E118 Type 2 diabetes mellitus with unspecified complications: Secondary | ICD-10-CM | POA: Diagnosis not present

## 2023-08-10 LAB — GLUCOSE, CAPILLARY
Glucose-Capillary: 159 mg/dL — ABNORMAL HIGH (ref 70–99)
Glucose-Capillary: 188 mg/dL — ABNORMAL HIGH (ref 70–99)
Glucose-Capillary: 149 mg/dL — ABNORMAL HIGH (ref 70–99)
Glucose-Capillary: 176 mg/dL — ABNORMAL HIGH (ref 70–99)

## 2023-08-10 NOTE — Progress Notes (Signed)
 PROGRESS NOTE   Subjective/Complaints: Pt reports no problems after vac removal yesterday. Says his pain is controlled. Asked him if he's watching Super Bowl and he told me no, he's going to relax.   ROS: Patient denies fever, rash, sore throat, blurred vision, dizziness, nausea, vomiting, diarrhea, cough, shortness of breath or chest pain,  headache, or mood change.   Objective:   No results found. No results for input(s): WBC, HGB, HCT, PLT in the last 72 hours.  No results for input(s): NA, K, CL, CO2, GLUCOSE, BUN, CREATININE, CALCIUM  in the last 72 hours.   Intake/Output Summary (Last 24 hours) at 08/10/2023 1019 Last data filed at 08/09/2023 2058 Gross per 24 hour  Intake 515 ml  Output 200 ml  Net 315 ml        Physical Exam: Vital Signs Blood pressure 108/60, pulse 63, temperature 98.6 F (37 C), temperature source Oral, resp. rate 17, height 5' 10 (1.778 m), weight 65.4 kg, SpO2 97%. Constitutional: No distress . Vital signs reviewed. HEENT: NCAT, EOMI, oral membranes moist Neck: supple Cardiovascular: RRR without murmur. No JVD    Respiratory/Chest: CTA Bilaterally without wheezes or rales. Normal effort    GI/Abdomen: BS +, non-tender, non-distended Ext: no clubbing, cyanosis, or edema Psych: pleasant and cooperative  Musculoskeletal:     Cervical back: Neck supple. No tenderness.       Ue's 5/5 and RLE 5/5 ending in BKA LLE- at least 3/5 - has L BKA  Skin:    Right BKA well healed.  Some superficial blistering along incision as below but no drainage. Pink epithelium under the blistered spots     Neurological:     General: No focal deficit present.     Mental Status: He is alert and oriented to person, place, and time. Seems to be cognitively appropriate on a basic level. No focal weakness.      Assessment/Plan: 1. Functional deficits which require 3+ hours per day of  interdisciplinary therapy in a comprehensive inpatient rehab setting. Physiatrist is providing close team supervision and 24 hour management of active medical problems listed below. Physiatrist and rehab team continue to assess barriers to discharge/monitor patient progress toward functional and medical goals  Care Tool:  Bathing    Body parts bathed by patient: Right arm, Left arm, Chest, Abdomen, Face   Body parts bathed by helper: Front perineal area, Buttocks, Right upper leg, Left upper leg, Face Body parts n/a: Right lower leg, Left lower leg (bilateral BKA)   Bathing assist Assist Level: Supervision/Verbal cueing     Upper Body Dressing/Undressing Upper body dressing   What is the patient wearing?: Pull over shirt    Upper body assist Assist Level: Moderate Assistance - Patient 50 - 74%    Lower Body Dressing/Undressing Lower body dressing      What is the patient wearing?: Incontinence brief, Pants     Lower body assist Assist for lower body dressing: Total Assistance - Patient < 25%     Toileting Toileting    Toileting assist Assist for toileting: Maximal Assistance - Patient 25 - 49%     Transfers Chair/bed transfer  Transfers assist  Chair/bed  transfer activity did not occur: Safety/medical concerns (unsafet to get up)  Chair/bed transfer assist level: Contact Guard/Touching assist (lateral scoot)     Locomotion Ambulation   Ambulation assist   Ambulation activity did not occur: Safety/medical concerns (pain, decreased balance on R prosthetic)          Walk 10 feet activity   Assist  Walk 10 feet activity did not occur: Safety/medical concerns (pain, decreased balance on R prosthetic)        Walk 50 feet activity   Assist Walk 50 feet with 2 turns activity did not occur: Safety/medical concerns (pain, decreased balance on R prosthetic)         Walk 150 feet activity   Assist Walk 150 feet activity did not occur: Safety/medical  concerns (pain, decreased balance on R prosthetic)         Walk 10 feet on uneven surface  activity   Assist Walk 10 feet on uneven surfaces activity did not occur: Safety/medical concerns (pain, decreased balance on R prosthetic)         Wheelchair     Assist Is the patient using a wheelchair?: Yes Type of Wheelchair: Manual    Wheelchair assist level: Supervision/Verbal cueing Max wheelchair distance: 158ft    Wheelchair 50 feet with 2 turns activity    Assist        Assist Level: Supervision/Verbal cueing   Wheelchair 150 feet activity     Assist      Assist Level: Supervision/Verbal cueing   Blood pressure 108/60, pulse 63, temperature 98.6 F (37 C), temperature source Oral, resp. rate 17, height 5' 10 (1.778 m), weight 65.4 kg, SpO2 97%.  Medical Problem List and Plan: 1. 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             -ELOS/Goals: 10-12 days at w/c level  IPOC completed   -Continue CIR therapies including PT, OT  2. Nonambulatory: 30mg  Lovenox  daily started, discussed with patient that goal is to discharge at wheelchair level   3. Pain Management: Tylenol , oxycodone  d/ced due to codeine  allergy   4. Hallucinations: discussed with wife that these are a side effect to codeine , oxycodone  d/ced   5. Neuropsych/cognition: currently, patient is not  capable of making decisions on his  own behalf. Confused, but hasn't received pain meds today.    6. Skin/Wound Care: Routine skin care checks   7. Fluids/Electrolytes/Nutrition: Routine Is and Os and follow-up chemistries             -continue vit C, zinc  supplements   8: Hypertension: monitor TID and prn             -continue Lopressor  25 mg BID             -on multiple bladder meds   9: Hyperlipidemia: continue statin   10: ? OAB/? BPH: on Proscar , Toviaz , Myrbetriq  and Flomax     11: GI prophylaxis: continue Protonix    12: Left BKA 1/31 Dr. Harden             -Prevena off on 2/07--VASHE solution applied---shrinker in place  -wound examined today and is clean, fairly intact   -new shrinker was placed   13: DM: CBGs QID, A1c = 10%             -  continue moderate scale SSI             -continue Novolog  4 units with meals             -continue Semglee  15 units at bedtime             -continue metformin  500 mg BID with meals  -provided list of foods for diabetes   CBG (last 3)  Recent Labs    08/09/23 1637 08/09/23 2054 08/10/23 0547  GLUCAP 132* 177* 159*    2/8 cbg's uncontrolled. Increase semglee  to 20u at bedtime  2/9 initial improvement today--obsv for consistent pattern 14: History of bicuspid aortic valve, moderate AV stenosis   15: History of CAD s/p LHC with PCI/stent on 07/12/2018             -completed DAPT for 12 months; on statin             -follows with Dr. Court   16: Pancytopenia: follows with Dr. Lanny and gets B12 injection monthly             -follow-up CBC     17. Constipation-    -add senna to colace  -2/9 had large type 6 bm this morning 18. Confusion: d/c oxycodone ---improved?  19, Hypotension: changed lopressor  to Toprol  XL 25mg  HS, decrease dose to 12.5mg   20. Fatigue: B12 ordered IM on 2/5, will order metanx  21. Polypharmacy: advised wife that we will provide her with a list of patient's medications  LOS: 5 days A FACE TO FACE EVALUATION WAS PERFORMED  Arthea ONEIDA Gunther 08/10/2023, 10:19 AM

## 2023-08-10 NOTE — Plan of Care (Signed)
  Problem: Consults Goal: RH LIMB LOSS PATIENT EDUCATION Description: Description: See Patient Education module for eduction specifics. Outcome: Progressing   Problem: RH BOWEL ELIMINATION Goal: RH STG MANAGE BOWEL WITH ASSISTANCE Description: STG Manage Bowel with mod I Assistance. Outcome: Progressing   Problem: RH BLADDER ELIMINATION Goal: RH STG MANAGE BLADDER WITH ASSISTANCE Description: STG Manage Bladder With mod I Assistance Outcome: Progressing

## 2023-08-11 DIAGNOSIS — Z89512 Acquired absence of left leg below knee: Secondary | ICD-10-CM | POA: Diagnosis not present

## 2023-08-11 LAB — BASIC METABOLIC PANEL
Anion gap: 16 — ABNORMAL HIGH (ref 5–15)
BUN: 21 mg/dL (ref 8–23)
CO2: 25 mmol/L (ref 22–32)
Calcium: 9.3 mg/dL (ref 8.9–10.3)
Chloride: 96 mmol/L — ABNORMAL LOW (ref 98–111)
Creatinine, Ser: 1.14 mg/dL (ref 0.61–1.24)
GFR, Estimated: >60 mL/min (ref >60)
Glucose, Bld: 239 mg/dL — ABNORMAL HIGH (ref 70–99)
Potassium: 4.3 mmol/L (ref 3.5–5.1)
Sodium: 137 mmol/L (ref 135–145)

## 2023-08-11 LAB — CBC
HCT: 27.7 % — ABNORMAL LOW (ref 39.0–52.0)
Hemoglobin: 8.7 g/dL — ABNORMAL LOW (ref 13.0–17.0)
MCH: 26.9 pg (ref 26.0–34.0)
MCHC: 31.4 g/dL (ref 30.0–36.0)
MCV: 85.5 fL (ref 80.0–100.0)
Platelets: 258 10*3/uL (ref 150–400)
RBC: 3.24 MIL/uL — ABNORMAL LOW (ref 4.22–5.81)
RDW: 14.2 % (ref 11.5–15.5)
WBC: 4.7 10*3/uL (ref 4.0–10.5)
nRBC: 0.4 % — ABNORMAL HIGH (ref 0.0–0.2)

## 2023-08-11 LAB — GLUCOSE, CAPILLARY
Glucose-Capillary: 229 mg/dL — ABNORMAL HIGH (ref 70–99)
Glucose-Capillary: 172 mg/dL — ABNORMAL HIGH (ref 70–99)
Glucose-Capillary: 188 mg/dL — ABNORMAL HIGH (ref 70–99)

## 2023-08-11 MED ORDER — INSULIN GLARGINE-YFGN 100 UNIT/ML ~~LOC~~ SOLN
21.0000 [IU] | Freq: Every day | SUBCUTANEOUS | Status: DC
  Administered 2023-08-11: 21 [IU] via SUBCUTANEOUS
  Filled 2023-08-11 (×2): qty 0.21

## 2023-08-11 MED ORDER — METOPROLOL SUCCINATE ER 25 MG PO TB24
25.0000 mg | ORAL_TABLET | Freq: Every day | ORAL | Status: DC
  Administered 2023-08-11 – 2023-08-13 (×3): 25 mg via ORAL
  Filled 2023-08-11 (×3): qty 1

## 2023-08-11 NOTE — Progress Notes (Signed)
 Physical Therapy Session Note  Patient Details  Name: SENAD URBEN MRN: [id number] Date of Birth: 11-Aug-1955  Today's Date: 08/11/2023 PT Individual Time: 1500-1530 PT Individual Time Calculation (min): 30 min   Short Term Goals: Week 1:  PT Short Term Goal 1 (Week 1): pt will perform bed mobility with min A PT Short Term Goal 2 (Week 1): pt will transfer bed<>chair with LRAD and mod A PT Short Term Goal 3 (Week 1): pt will transfer sit<>stand with LRAD and mod A  Skilled Therapeutic Interventions/Progress Updates:      Pt in bed to start - agreeable to PT tx. Bed mobility completed with supervision with HOB Flat. Able to scoot to EOB without assist. Don's his prosthesis and limb guard with setupA as he sit's EOB. Completed lateral scoot transfer with light minA and no AD - pt "sits on the wheel" mid transfer - pt reports this is how he typically does his transfers.  Transported to main rehab gym for time.  Pt setup at 6" stairs with 2 hand rails. Pt confident he would be able to go up/down x4 stairs without difficulty. Educated on awareness and insight but patient persistent on trying. Sit<>Stand by pulling himself up using 2 hand rails with CGA. Patient needing modA to "hop" up the 1st step. Unable to safely stand full upright when on the 1st step and also having difficulty advancing R foot to make sure fully on step. Returned to wheelchair for safety concerns and educated patient on limitations.   In // bars, patient completed functional gait training 2x44ft with no rest break with CGA and BUE support from // bars. Increased difficulty with turning to sit and turning at the end of the // bars as his knee twists with foot planted.   Returned to his room with and patient assisted back to bed with lateral scoot transfer with CGA/minA. Able to remove prosthesis and limb guard without assist. Bed mobility back to supine completed without assist. All needs met at end.   Therapy  Documentation Precautions:  Precautions Precautions: Fall Precaution Comments: LLE Wound Vac Required Braces or Orthoses: Other Brace Other Brace: LLE Limb Guard Restrictions Weight Bearing Restrictions Per Provider Order: Yes LLE Weight Bearing Per Provider Order: Non weight bearing General:    Therapy/Group: Individual Therapy  Pheobe Brass 08/11/2023, 10:49 AM

## 2023-08-11 NOTE — Progress Notes (Signed)
 Physical Therapy Session Note  Patient Details  Name: Adam Ray MRN: [id number] Date of Birth: [birth year]  Today's Date: 08/11/2023 PT Individual Time: 0915-0942 PT Individual Time Calculation (min): 27 min   Short Term Goals: Week 1:  PT Short Term Goal 1 (Week 1): pt will perform bed mobility with min A PT Short Term Goal 2 (Week 1): pt will transfer bed<>chair with LRAD and mod A PT Short Term Goal 3 (Week 1): pt will transfer sit<>stand with LRAD and mod A  Skilled Therapeutic Interventions/Progress Updates:      Pt supine in bed upon arrival, pt initially hesitant to participate in therapy today 2/2 staple coming out during previous OT session. Verified with nurse and nurse coordinator, okay to continue therapy. Pt has dressing and ACE wrap in place to L BKA. Pt eventually agreeable to bed level therex with education.   Pt reports 10/10 L LE incisional pain, notified nurse, nurse present to administer pain medication.    Pt performed the following therex for B LE strengthening/ROM, contracture prevention:   1x15 SLR BLR   1x15 supine hip abduction B  1x15 glute sets holding for 5 seconds  1x15 quads sets holding for 5 seconds B  Positioned pt in supine, education provided on importance of lying in supine to stretch hip flexors, and prevent hip flexor contracture. Pt able to provide teach back of need to keep knee extended for prevention of knee flexion contraction.   Pt supine in bed with all needs within reach and bed alarm on at end of session.     Therapy Documentation Precautions:  Precautions Precautions: Fall Precaution Comments: LLE Wound Vac Required Braces or Orthoses: Other Brace Other Brace: LLE Limb Guard Restrictions Weight Bearing Restrictions Per Provider Order: Yes LLE Weight Bearing Per Provider Order: Non weight bearing  Therapy/Group: Individual Therapy  Jenney Modest 08/11/2023, 7:47 AM

## 2023-08-11 NOTE — Progress Notes (Signed)
 Occupational Therapy Session Note  Patient Details  Name: Adam Ray MRN: [id number] Date of Birth: 1956-04-14  Today's Date: 08/11/2023 OT Individual Time: 6295-2841 OT Individual Time Calculation (min): 22 min  and Today's Date: 08/11/2023 OT Missed Time: 8 Minutes Missed Time Reason: Nursing care   Short Term Goals: Week 1:  OT Short Term Goal 1 (Week 1): Pt will complete sit > stand in prep for ADL with mod A using LRAD OT Short Term Goal 2 (Week 1): Pt will complete 1/3 toileting steps with mod A for balance OT Short Term Goal 3 (Week 1): Pt will donn LB clothing with mod A OT Short Term Goal 4 (Week 1): Pt will complete toilet transfer with mod A using LRAD  Skilled Therapeutic Interventions/Progress Updates:  Pain: upon sitting , pt c/o of heavy pressure in L limb (see note below)   Pt received in bed stating his brief needed to be changed, it was quite wet. Pt stated he is having difficulty knowing when he needs to urinate. To doff brief,  cleanse and then don new brief had pt work on rolling actively in bed to use his abdominals to increase core strength.  Pt did well with this and following all directions.  Pt cleansed his perineal area and bottom himself, assisted with brief.  He then was able to don pants from supine without A.  Sat to EOB with Supervision.  Upon sitting pt stated he felt pressure in his L limb, pt had shrinker on.  Suggested he shift back in the bed to give his leg more support.  Once he did pt about to doff his shirt, and then blood started to profuse in a fast manner from L limb, with a significant amount of blood on the floor.  Immediately had pt lay down and wrapped towels around limb with compression keeping his leg elevated.  Call nursing right away. Nursing arrived to attend to his leg.    Therapy Documentation Precautions:  Precautions Precautions: Fall Precaution Comments: LLE Wound Vac Required Braces or Orthoses: Other Brace Other Brace: LLE Limb  Guard Restrictions Weight Bearing Restrictions Per Provider Order: Yes LLE Weight Bearing Per Provider Order: Non weight bearing   Vital Signs: Therapy Vitals Temp: 98.3 F (36.8 C) Temp Source: Oral Pulse Rate: 75 Resp: 18 BP: (!) 149/66 Oxygen Therapy SpO2: 99 %    ADL: ADL Eating: Minimal assistance Where Assessed-Eating: Wheelchair Grooming: Minimal assistance Where Assessed-Grooming: Sitting at sink Upper Body Bathing: Supervision/safety Where Assessed-Upper Body Bathing: Sitting at sink Lower Body Bathing: Dependent (simulated) Where Assessed-Lower Body Bathing: Sitting at sink, Standing at sink Upper Body Dressing: Moderate assistance Where Assessed-Upper Body Dressing: Wheelchair Lower Body Dressing: Dependent (simulated) Where Assessed-Lower Body Dressing: Sitting at sink, Standing at sink Toileting: Dependent Where Assessed-Toileting: Bedside Commode Toilet Transfer: Dependent Toilet Transfer Method: Other (comment) (stedy) Toilet Transfer Equipment: Bedside commode Tub/Shower Transfer: Unable to assess Tub/Shower Transfer Method: Unable to assess [employer] Transfer: Unable to assess [employer] Method: Unable to assess  Therapy/Group: Individual Therapy  Akyia Borelli 08/11/2023, 8:29 AM

## 2023-08-11 NOTE — Progress Notes (Signed)
 PROGRESS NOTE   Subjective/Complaints: +bleeding from residual limb, Hgb is improved, will d/c lovenox  given bleeding- has not yet received this morning, continue aspirin   ROS: Patient denies fever, rash, sore throat, blurred vision, dizziness, nausea, vomiting, diarrhea, cough, shortness of breath or chest pain,  headache, or mood change. +bleeding from residual limb incision  Objective:   No results found. Recent Labs    08/11/23 0552  WBC 4.7  HGB 8.7*  HCT 27.7*  PLT 258    Recent Labs    08/11/23 0552  NA 137  K 4.3  CL 96*  CO2 25  GLUCOSE 239*  BUN 21  CREATININE 1.14  CALCIUM  9.3     Intake/Output Summary (Last 24 hours) at 08/11/2023 0924 Last data filed at 08/11/2023 0600 Gross per 24 hour  Intake 475 ml  Output 950 ml  Net -475 ml        Physical Exam: Vital Signs Blood pressure (!) 149/66, pulse 75, temperature 98.3 F (36.8 C), temperature source Oral, resp. rate 18, height 5\' 10"  (1.778 m), weight 65.4 kg, SpO2 99%. Constitutional: No distress . Vital signs reviewed. HEENT: NCAT, EOMI, oral membranes moist Neck: supple Cardiovascular: RRR without murmur. No JVD    Respiratory/Chest: CTA Bilaterally without wheezes or rales. Normal effort    GI/Abdomen: BS +, non-tender, non-distended Ext: no clubbing, cyanosis, or edema Psych: pleasant and cooperative  Musculoskeletal:     Cervical back: Neck supple. No tenderness.       Ue's 5/5 and RLE 5/5 ending in BKA LLE- at least 3/5 - has L BKA  Skin:    Right BKA well healed.  Some superficial blistering along incision as below but no drainage. Pink epithelium under the blistered spots, with bleeding 2/10     Neurological:     General: No focal deficit present.     Mental Status: He is alert and oriented to person, place, and time. Seems to be cognitively appropriate on a basic level. No focal weakness.      Assessment/Plan: 1.  Functional deficits which require 3+ hours per day of interdisciplinary therapy in a comprehensive inpatient rehab setting. Physiatrist is providing close team supervision and 24 hour management of active medical problems listed below. Physiatrist and rehab team continue to assess barriers to discharge/monitor patient progress toward functional and medical goals  Care Tool:  Bathing    Body parts bathed by patient: Right arm, Left arm, Chest, Abdomen, Face   Body parts bathed by helper: Front perineal area, Buttocks, Right upper leg, Left upper leg, Face Body parts n/a: Right lower leg, Left lower leg (bilateral BKA)   Bathing assist Assist Level: Supervision/Verbal cueing     Upper Body Dressing/Undressing Upper body dressing   What is the patient wearing?: Pull over shirt    Upper body assist Assist Level: Moderate Assistance - Patient 50 - 74%    Lower Body Dressing/Undressing Lower body dressing      What is the patient wearing?: Incontinence brief, Pants     Lower body assist Assist for lower body dressing: Total Assistance - Patient < 25%     Toileting Toileting    Toileting assist  Assist for toileting: Maximal Assistance - Patient 25 - 49%     Transfers Chair/bed transfer  Transfers assist  Chair/bed transfer activity did not occur: Safety/medical concerns (unsafet to get up)  Chair/bed transfer assist level: Contact Guard/Touching assist (lateral scoot)     Locomotion Ambulation   Ambulation assist   Ambulation activity did not occur: Safety/medical concerns (pain, decreased balance on R prosthetic)          Walk 10 feet activity   Assist  Walk 10 feet activity did not occur: Safety/medical concerns (pain, decreased balance on R prosthetic)        Walk 50 feet activity   Assist Walk 50 feet with 2 turns activity did not occur: Safety/medical concerns (pain, decreased balance on R prosthetic)         Walk 150 feet  activity   Assist Walk 150 feet activity did not occur: Safety/medical concerns (pain, decreased balance on R prosthetic)         Walk 10 feet on uneven surface  activity   Assist Walk 10 feet on uneven surfaces activity did not occur: Safety/medical concerns (pain, decreased balance on R prosthetic)         Wheelchair     Assist Is the patient using a wheelchair?: Yes Type of Wheelchair: Manual    Wheelchair assist level: Supervision/Verbal cueing Max wheelchair distance: 14ft    Wheelchair 50 feet with 2 turns activity    Assist        Assist Level: Supervision/Verbal cueing   Wheelchair 150 feet activity     Assist      Assist Level: Supervision/Verbal cueing   Blood pressure (!) 149/66, pulse 75, temperature 98.3 F (36.8 C), temperature source Oral, resp. rate 18, height 5\' 10"  (1.778 m), weight 65.4 kg, SpO2 99%.  Medical Problem List and Plan: 1. 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             -ELOS/Goals: 10-12 days at w/c level  IPOC completed   -continue CIR therapies including PT, OT   2. Bleeding from residual limb incision: lovenox  d/ced   3. Pain Management: Tylenol , oxycodone  d/ced due to codeine  allergy, tramadol  ordered prn   4. Hallucinations: discussed with wife that these are a side effect to codeine , oxycodone  d/ced   5. Neuropsych/cognition: currently, patient is not  capable of making decisions on his  own behalf. Confused, but hasn't received pain meds today.    6. Skin/Wound Care: Routine skin care checks   7. Fluids/Electrolytes/Nutrition: Routine Is and Os and follow-up chemistries             -continue vit C, zinc  supplements   8: Hypertension: monitor TID and prn             -increase toprol  XL to 25mg  HS             -on multiple bladder meds   9: Hyperlipidemia: continue statin   10: ? OAB/? 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 Julio Ohm             -Prevena off on 2/07--"VASHE" solution applied---shrinker in place  -wound examined today and is clean, fairly intact  -new shrinker was placed  13: DM: CBGs QID, A1c = 10%             -continue moderate scale SSI             -continue Novolog  4 units with meals             -continue Semglee  15 units at bedtime             -continue metformin  500 mg BID with meals  -provided list of foods for diabetes   CBG (last 3)  Recent Labs    08/10/23 1626 08/10/23 2053 08/11/23 0611  GLUCAP 149* 176* 229*    2/8 cbg's uncontrolled. Increase semglee  to 20u at bedtime  2/9 initial improvement today--obsv for consistent pattern 14: History of bicuspid aortic valve, moderate AV stenosis   15: History of CAD s/p LHC with PCI/stent on 07/12/2018             -completed DAPT for 12 months; on statin             -follows with Dr. Katheryne Pane   16: Pancytopenia: follows with Dr. Maryalice Smaller and gets B12 injection monthly             -follow-up CBC     17. Constipation-    -add senna to colace  -2/9 had large type 6 bm this morning 18. Confusion: d/c oxycodone ---improved?  19, Hypotension: changed lopressor  to Toprol  XL 25mg  HS, decrease dose to 12.5mg   20. Fatigue: B12 ordered IM on 2/5, will order metanx  21. Polypharmacy: advised wife that we will provide her with a list of patient's medications  LOS: 6 days A FACE TO FACE EVALUATION WAS PERFORMED  Keven Pel Tashaya Ancrum 08/11/2023, 9:24 AM

## 2023-08-11 NOTE — Plan of Care (Signed)
  Problem: Consults Goal: RH LIMB LOSS PATIENT EDUCATION Description: Description: See Patient Education module for eduction specifics. Outcome: Progressing   Problem: RH BOWEL ELIMINATION Goal: RH STG MANAGE BOWEL WITH ASSISTANCE Description: STG Manage Bowel with mod I Assistance. Outcome: Progressing   Problem: RH BOWEL ELIMINATION Goal: RH STG MANAGE BOWEL W/MEDICATION W/ASSISTANCE Description: STG Manage Bowel with Medication with mod I Assistance. Outcome: Progressing   Problem: RH BLADDER ELIMINATION Goal: RH STG MANAGE BLADDER WITH ASSISTANCE Description: STG Manage Bladder With mod I Assistance Outcome: Progressing

## 2023-08-11 NOTE — Progress Notes (Signed)
 Charge nurse presented to patient room. Therapist in room holding patient left BKA. Stated that the patient stated that he was feeling pressure and started bleeding from stump. Patient legs elevated. MD Raulkar called new order to discontinue lovenox  and change shrinker to non adhesive wrap with kerlex gauze and ace wrap. Update and order give to assigned nurse. MD to follow up. Jenetta Misty, RN

## 2023-08-11 NOTE — Progress Notes (Signed)
 Occupational Therapy Session Note  Patient Details  Name: Adam Ray MRN: [id number] Date of Birth: 1955-08-26  Today's Date: 08/11/2023 OT Individual Time: 1001-1115 1st Session, 1401-1444 2nd Session  OT Individual Time Calculation (min): 74 min, 43 min    Short Term Goals: Week 1:  OT Short Term Goal 1 (Week 1): Pt will complete sit > stand in prep for ADL with mod A using LRAD OT Short Term Goal 2 (Week 1): Pt will complete 1/3 toileting steps with mod A for balance OT Short Term Goal 3 (Week 1): Pt will donn LB clothing with mod A OT Short Term Goal 4 (Week 1): Pt will complete toilet transfer with mod A using LRAD  Skilled Therapeutic Interventions/Progress Updates:   Session 1:  Pt seen for skilled OT session this am. Care coord with previous OT clinician who rep[orted pt had significant bleeding in her session from a staple on L LE incision site. OT spoke with nursing and there are no restrictions as pain meds administered and LE wrapped. Pt agreeable for light UE therex this visit and OT will mobilize to ADL apt for 2nd session later day. Pt also is interested in shower retraining and will trial if up to it tomorrow. Pt open to B UE cane ex with 2 lb bar 2 sets 10 reps, scap, sh, elbow then 2 sets of 20 1 lb weighted ball biceps curls and 2 sets of 10 reps triceps press with red (med) resistance bands. Issued LH sponge and instructed in use for residual limbs and back as well as issued and trained with a demo [facility] skin inspection mirror. Pt able to teach back use bed level with no bleeding noted through bandages. Pt left resting bed level with bed exit alarm on, needs and nurse call button in reach.   Pain: 5/10 L LE with pain meds and repositioning    Session 2:  Pt seen for skilled OT. Pt bed level and agreeable for OOB this session. L residual limb still wrapped and pt used demo skin inspection mirror to observe if any bleeding occurred since last visit before lunch. Pt  transferred to EOB with min cues and CGA to apply R prosthesis with set up and CGA. Limb guard donning with min A. Pt directed care for transfer laterally to R side with min A/CGA. Self propelled 3 sets of 25 ft with CGA. UB [employer] in ortho gym 6 sets of 2 min seated forward and reverse with rests in between on L1. Once back in room, pt hand off to NT for assist to set up by bed after bed making completed.   Pain: 2/10 with limb guard and amp pad in w/c in place    Therapy Documentation Precautions:  Precautions Precautions: Fall Precaution Comments: LLE Wound Vac Required Braces or Orthoses: Other Brace Other Brace: LLE Limb Guard Restrictions Weight Bearing Restrictions Per Provider Order: Yes LLE Weight Bearing Per Provider Order: Non weight bearing  Therapy/Group: Individual Therapy  Merrill Abide 08/11/2023, 7:54 AM

## 2023-08-11 NOTE — Discharge Instructions (Addendum)
 Inpatient Rehab Discharge Instructions  Adam Ray Discharge date and time: 08/23/2023  Activities/Precautions/ Functional Status: Activity: no lifting, driving, or strenuous exercise until cleared by Md Diet: diabetic diet Wound Care: keep wound clean and dry Functional status:  ___ No restrictions     ___ Walk up steps independently ___ 24/7 supervision/assistance   ___ Walk up steps with assistance __x_ Intermittent supervision/assistance  ___ Bathe/dress independently ___ Walk with walker     ___ Bathe/dress with assistance ___ Walk Independently    ___ Shower independently ___ Walk with assistance    __x_ Shower with assistance _x__ No alcohol     ___ Return to work/school ________  Special Instructions: No driving, alcohol consumption or tobacco use.  COMMUNITY REFERRALS UPON DISCHARGE:    Home Health:   PT      OT      SNA                Agency: [facility]  Phone: [phone] *Please expect follow-up within 2-3 business days for discharge to schedule your home visit. If you have not received follow-up, be sure to contact the site directly.*     Medical Equipment/Items Ordered: light weight wheelchair, bariatric bedside commode, and tub transfer bench                                                 Agency[facility]Supplier[facility]   My questions have been answered and I understand these instructions. I will adhere to these goals and the provided educational materials after my discharge from the hospital.  Patient/Caregiver Signature _______________________________ Date __________  Clinician Signature _______________________________________ Date __________  Please bring this form and your medication list with you to all your follow-up doctor's appointments.

## 2023-08-12 ENCOUNTER — Encounter (HOSPITAL_COMMUNITY): Payer: Self-pay | Admitting: Physical Medicine and Rehabilitation

## 2023-08-12 DIAGNOSIS — Z89512 Acquired absence of left leg below knee: Secondary | ICD-10-CM | POA: Diagnosis not present

## 2023-08-12 LAB — GLUCOSE, CAPILLARY
Glucose-Capillary: 212 mg/dL — ABNORMAL HIGH (ref 70–99)
Glucose-Capillary: 167 mg/dL — ABNORMAL HIGH (ref 70–99)
Glucose-Capillary: 188 mg/dL — ABNORMAL HIGH (ref 70–99)
Glucose-Capillary: 181 mg/dL — ABNORMAL HIGH (ref 70–99)

## 2023-08-12 MED ORDER — METFORMIN HCL 850 MG PO TABS
850.0000 mg | ORAL_TABLET | Freq: Two times a day (BID) | ORAL | Status: DC
  Administered 2023-08-12 – 2023-08-14 (×4): 850 mg via ORAL
  Filled 2023-08-12 (×4): qty 1

## 2023-08-12 MED ORDER — INSULIN GLARGINE-YFGN 100 UNIT/ML ~~LOC~~ SOLN
22.0000 [IU] | Freq: Every day | SUBCUTANEOUS | Status: DC
  Administered 2023-08-12: 22 [IU] via SUBCUTANEOUS
  Filled 2023-08-12 (×2): qty 0.22

## 2023-08-12 NOTE — Progress Notes (Signed)
Occupational Therapy Session Note  Patient Details  Name: Adam Ray MRN: [id number] Date of Birth: 06-27-1956  Today's Date: 08/12/2023 OT Individual Time: 4403-4742 OT Individual Time Calculation (min): 55 min    Short Term Goals: Week 1:  OT Short Term Goal 1 (Week 1): Pt will complete sit > stand in prep for ADL with mod A using LRAD OT Short Term Goal 2 (Week 1): Pt will complete 1/3 toileting steps with mod A for balance OT Short Term Goal 3 (Week 1): Pt will donn LB clothing with mod A OT Short Term Goal 4 (Week 1): Pt will complete toilet transfer with mod A using LRAD  Skilled Therapeutic Interventions/Progress Updates:  Skilled OT intervention completed with focus on w/c mobility, BUE endurance, functional transfers, pressure relief education/practice. Pt received seated in w/c, agreeable to session. No pain reported.  Pt was perseverative on the Lt stump bleeding events from yesterday with several questions about wound management and why surgeon didn't do certain methods during procedure. Advised pt that if wound dressing has not bled through, best to leave in place for today and reassess tomorrow.   Pt refused to trial going to the bathroom to manage incontinence, stating "I haven't peed since this morning, but I normally only pee once or twice a day at home." Encouraged pt to hydrate and that toileting more frequent is healthier for him. Pt with continued poor insight to deficits.   Pt self-propelled in w/c about 100 ft using BUE with supervision; cues for direction. Transported rest of way for energy conservation to gym. Pt completed the following intervals while seated at the SCIFIT to promote global/BUE endurance needed for independence with BADLs and functional transfers: -5 min, level 5, forwards -5 min, level 5, backwards Intermittent rest breaks needed for fatigue  Pt able to manage doffing of leg rests with min A/min cues for technique but able to position w/c next  to mat with verbal cues, then laterally scoot with close supervision <> EOM.   Seated EOM, pt completed 3x5 push ups with push up blocks stability provided only at push up blocks. Pt was able to fully clear buttocks and come to partial stand with good control and descent control. Discussed pressure relief techniques similar to this in w/c via arm rests however discussed safety precautions.  Back in room, pt remained seated in w/c, with belt alarm on/activated, and with all needs in reach at end of session.   Therapy Documentation Precautions:  Precautions Precautions: Fall Required Braces or Orthoses: Other Brace Other Brace: LLE Limb Guard Restrictions Weight Bearing Restrictions Per Provider Order: Yes LLE Weight Bearing Per Provider Order: Non weight bearing    Therapy/Group: Individual Therapy  Melvyn Novas, MS, OTR/L  08/12/2023, 1:58 PM

## 2023-08-12 NOTE — Progress Notes (Signed)
 Patient ID: Adam Ray, male   DOB: [birth year], 68 y.o.   MRN: [id number]  1224- SW left detailed message for pt wife providing updates from team conference, and requested return phone call to discuss family edu.   *SW received return call from pt wife Marylene Land to discuss above.  Wife expressed concerns about his current incision and feels it needs to be addressed. SW shared her concerns will be relayed to medical team.   Cecile Sheerer, MSW, LCSW Office: [phone] Cell: [phone] Fax: [phone]

## 2023-08-12 NOTE — Progress Notes (Signed)
Physical Therapy Session Note  Patient Details  Name: Adam Ray MRN: [id number] Date of Birth: 1956/04/13  Today's Date: 08/12/2023 PT Individual Time: 0800-0913 + 1415-1530 PT Individual Time Calculation (min): 73 min   + 75 min  Short Term Goals: Week 1:  PT Short Term Goal 1 (Week 1): pt will perform bed mobility with min A PT Short Term Goal 2 (Week 1): pt will transfer bed<>chair with LRAD and mod A PT Short Term Goal 3 (Week 1): pt will transfer sit<>stand with LRAD and mod A  Skilled Therapeutic Interventions/Progress Updates:      1st session: Pt in bed to start - has no c/o pain. Dressing on L BKA c/d/I.  Supine<>Sitting EOB with supervision. Don's R prosthesis and L limb guard with setupA as he sit's EOB. Completes lateral scoot transfer with CGA from EOB to wheelchair, assist for wheelchair setup.   Pt propels himself at supervision level from his room to main rehab gym, ~110ft - cues for increasing shoulder extension during propulsion to improve speed and efficiency.   Worked on BLE strengthening, sit<>stands, and gait training in // bars. Sit<>Stand in // bar (pulling to stand) with CGA. Gait training 2x40ft with minA and // bars - assist for stability and support, increased assist needed to safely turn within the // bars. Posterior lean noted while turning.   Forward/backward hopping 2x34ft with CGA/minA and // bar - continues to demo posterior bias and relies heavily on UE strength/support. 1x5 sit<>stands in // bar with supervision (pulling to stand). Finished LE strengthening in standing -1x25 hip abd on L, 1x25 hip flex on L.   Finished session working on core strengthening and stretching for BLE. At [facility] - completed sit ups 1x15 with pulsing crunches at end range. Worked on lying flat on table to promote hip extension bilaterally during rest breaks.   Pt returned to his room at the end of treatment - left sitting upright with safety measures in place, call bell within  reach. Pt aware of daily therapy schedule.    2nd session: Pt sitting in wheelchair to start - no reports of pain. Wearing his RLE prosthesis and LLE limb guard. Patient requesting to trial his mobility scooter. MinA for squat pivot/lateral scoot transfer onto the mobility scooter - would benefit from further practice due to surface height difference. Pt drove his electric mobility scooter through CIR hallways > 592ft with supervision for safety, progressing to mod I. Supervision for entering and exiting the elevators. Patient demonstrated ability to drive outside on paved surfaces, min cues for safety while turning for slowing down his speed. Pt reports his electric mobility scooter will fit in the house, will need to confirm with family if plan is to use this in the home. Returned upstairs to [employer] floor and assisted patient off electric scooter onto mat table.  Worked on supine there-ex for BLE strengthening -2x20 SLR -2x20 hip abd/add -2x20 quad sets with towel roll at end of residual limb on LLE -2x20 R sidelying hip abd/add -2x20 R sidelying hip ext/flex  Returned to mobility scooter with minA squat pivot transfer with height equal to mat table. Pt drove himself using electric scooter >158ft back to his room at supervision level. Assisted back to bed with minA and patient removed prosthesis and limb guard without assist as he sat EOB. All needs met at end with bed alarm on.    Therapy Documentation Precautions:  Precautions Precautions: Fall Precaution/Restrictions Comments: LLE Wound Vac Required Braces  or Orthoses: Other Brace Other Brace: LLE Limb Guard Restrictions Weight Bearing Restrictions Per Provider Order: Yes LLE Weight Bearing Per Provider Order: Non weight bearing General:      Therapy/Group: Individual Therapy  Orrin Brigham 08/12/2023, 7:46 AM

## 2023-08-12 NOTE — Progress Notes (Signed)
PROGRESS NOTE   Subjective/Complaints: No new complaints this morning Bleeding has stopped Receiving morning medications from nursing Nursing has no new concerns  ROS: Patient denies fever, rash, sore throat, blurred vision, dizziness, nausea, vomiting, diarrhea, cough, shortness of breath or chest pain,  headache, or mood change. +bleeding from residual limb incision  Objective:   No results found. Recent Labs    08/11/23 0552  WBC 4.7  HGB 8.7*  HCT 27.7*  PLT 258    Recent Labs    08/11/23 0552  NA 137  K 4.3  CL 96*  CO2 25  GLUCOSE 239*  BUN 21  CREATININE 1.14  CALCIUM 9.3     Intake/Output Summary (Last 24 hours) at 08/12/2023 1054 Last data filed at 08/11/2023 2200 Gross per 24 hour  Intake --  Output 400 ml  Net -400 ml        Physical Exam: Vital Signs Blood pressure (!) 105/54, pulse 66, temperature 98.6 F (37 C), temperature source Oral, resp. rate 16, height 5\' 10"  (1.778 m), weight 65.4 kg, SpO2 97%. Constitutional: No distress . Vital signs reviewed. HEENT: NCAT, EOMI, oral membranes moist Neck: supple Cardiovascular: RRR without murmur. No JVD    Respiratory/Chest: CTA Bilaterally without wheezes or rales. Normal effort    GI/Abdomen: BS +, non-tender, non-distended Ext: no clubbing, cyanosis, or edema Psych: pleasant and cooperative  Musculoskeletal:     Cervical back: Neck supple. No tenderness.       Ue's 5/5 and RLE 5/5 ending in BKA LLE- at least 3/5 - has L BKA  Skin:    Right BKA well healed.  Some superficial blistering along incision as below but no drainage. Pink epithelium under the blistered spots, bleeding resolved 2/11     Neurological:     General: No focal deficit present.     Mental Status: He is alert and oriented to person, place, and time. Seems to be cognitively appropriate on a basic level. No focal weakness.      Assessment/Plan: 1. Functional  deficits which require 3+ hours per day of interdisciplinary therapy in a comprehensive inpatient rehab setting. Physiatrist is providing close team supervision and 24 hour management of active medical problems listed below. Physiatrist and rehab team continue to assess barriers to discharge/monitor patient progress toward functional and medical goals  Care Tool:  Bathing    Body parts bathed by patient: Right arm, Left arm, Chest, Abdomen, Face   Body parts bathed by helper: Front perineal area, Buttocks, Right upper leg, Left upper leg, Face Body parts n/a: Right lower leg, Left lower leg (bilateral BKA)   Bathing assist Assist Level: Supervision/Verbal cueing     Upper Body Dressing/Undressing Upper body dressing   What is the patient wearing?: Pull over shirt    Upper body assist Assist Level: Moderate Assistance - Patient 50 - 74%    Lower Body Dressing/Undressing Lower body dressing      What is the patient wearing?: Incontinence brief, Pants     Lower body assist Assist for lower body dressing: Total Assistance - Patient < 25%     Toileting Toileting    Toileting assist Assist for toileting:  Maximal Assistance - Patient 25 - 49%     Transfers Chair/bed transfer  Transfers assist  Chair/bed transfer activity did not occur: Safety/medical concerns (unsafet to get up)  Chair/bed transfer assist level: Contact Guard/Touching assist (lateral scoot)     Locomotion Ambulation   Ambulation assist   Ambulation activity did not occur: Safety/medical concerns (pain, decreased balance on R prosthetic)          Walk 10 feet activity   Assist  Walk 10 feet activity did not occur: Safety/medical concerns (pain, decreased balance on R prosthetic)        Walk 50 feet activity   Assist Walk 50 feet with 2 turns activity did not occur: Safety/medical concerns (pain, decreased balance on R prosthetic)         Walk 150 feet activity   Assist Walk 150  feet activity did not occur: Safety/medical concerns (pain, decreased balance on R prosthetic)         Walk 10 feet on uneven surface  activity   Assist Walk 10 feet on uneven surfaces activity did not occur: Safety/medical concerns (pain, decreased balance on R prosthetic)         Wheelchair     Assist Is the patient using a wheelchair?: Yes Type of Wheelchair: Manual    Wheelchair assist level: Supervision/Verbal cueing Max wheelchair distance: 164ft    Wheelchair 50 feet with 2 turns activity    Assist        Assist Level: Supervision/Verbal cueing   Wheelchair 150 feet activity     Assist      Assist Level: Supervision/Verbal cueing   Blood pressure (!) 105/54, pulse 66, temperature 98.6 F (37 C), temperature source Oral, resp. rate 16, height 5\' 10"  (1.778 m), weight 65.4 kg, SpO2 97%.  Medical Problem List and Plan: 1. 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             -ELOS/Goals: 10-12 days at w/c level  IPOC completed   -continue CIR therapies including PT, OT   2. Bleeding from residual limb incision: resolved, lovenox d/ced, continue aspirin   3. Pain Management: Tylenol, oxycodone d/ced due to codeine allergy, tramadol ordered prn   4. Hallucinations: discussed with wife that these are a side effect to codeine, oxycodone d/ced   5. Neuropsych/cognition: currently, patient is not  capable of making decisions on his  own behalf. Confused, but hasn't received pain meds today.    6. Skin/Wound Care: Routine skin care checks   7. Fluids/Electrolytes/Nutrition: Routine Is and Os and follow-up chemistries             -continue vit C, zinc supplements   8: Hypertension: monitor TID and prn             -increase toprol XL to 25mg  HS             -on multiple bladder meds   9: Hyperlipidemia: continue statin   10: ? OAB/? 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 Lajoyce Corners              -new shrinker was placed, continue   13: DM: CBGs QID, A1c = 10%             -  continue moderate scale SSI             -continue Novolog 4 units with meals             -continue metformin 500 mg BID with meals  -provided list of foods for diabetes   CBG (last 3)  Recent Labs    08/11/23 1214 08/11/23 2123 08/12/23 0638  GLUCAP 172* 188* 212*    Increase semglee to 22U  Increase metformin to 850mg  BID 14: History of bicuspid aortic valve, moderate AV stenosis   15: History of CAD s/p LHC with PCI/stent on 07/12/2018             -completed DAPT for 12 months; continue statin             -follows with Dr. Allyson Sabal   16: Pancytopenia: follows with Dr. Mosetta Putt and gets B12 injection monthly             -follow-up CBC     17. Constipation-    -add senna to colace   18. Confusion: d/c oxycodone---improved?  19, Hypotension: changed lopressor to Toprol XL 25mg  HS, decrease dose to 12.5mg   20. Fatigue: B12 ordered IM on 2/5, will order metanx  21. Polypharmacy: advised wife that we will provide her with a list of patient's medications  LOS: 7 days A FACE TO FACE EVALUATION WAS PERFORMED  Drema Pry Deneshia Zucker 08/12/2023, 10:54 AM

## 2023-08-13 ENCOUNTER — Inpatient Hospital Stay (HOSPITAL_COMMUNITY): Payer: No Typology Code available for payment source

## 2023-08-13 DIAGNOSIS — Z89512 Acquired absence of left leg below knee: Secondary | ICD-10-CM | POA: Diagnosis not present

## 2023-08-13 DIAGNOSIS — M79661 Pain in right lower leg: Secondary | ICD-10-CM | POA: Diagnosis not present

## 2023-08-13 LAB — GLUCOSE, CAPILLARY
Glucose-Capillary: 208 mg/dL — ABNORMAL HIGH (ref 70–99)
Glucose-Capillary: 204 mg/dL — ABNORMAL HIGH (ref 70–99)
Glucose-Capillary: 129 mg/dL — ABNORMAL HIGH (ref 70–99)
Glucose-Capillary: 182 mg/dL — ABNORMAL HIGH (ref 70–99)

## 2023-08-13 MED ORDER — INSULIN GLARGINE-YFGN 100 UNIT/ML ~~LOC~~ SOLN
23.0000 [IU] | Freq: Every day | SUBCUTANEOUS | Status: DC
Start: 2023-08-13 — End: 2023-08-21
  Administered 2023-08-13 – 2023-08-20 (×8): 23 [IU] via SUBCUTANEOUS
  Filled 2023-08-13 (×9): qty 0.23

## 2023-08-13 MED ORDER — IBUPROFEN 200 MG PO TABS
200.0000 mg | ORAL_TABLET | ORAL | Status: DC | PRN
  Administered 2023-08-13 – 2023-08-19 (×7): 200 mg via ORAL
  Filled 2023-08-13 (×8): qty 1

## 2023-08-13 NOTE — Progress Notes (Signed)
Venous duplex lower ext.  has been completed. Refer to [facility] under chart review to view preliminary results.   08/13/2023  5:10 PM Adam Ray, Adam Ray

## 2023-08-13 NOTE — Progress Notes (Signed)
Patient ID: Adam Ray, male   DOB: 08-09-1955, 68 y.o.   MRN: [id number] Examination of the left transtibial amputation shows some mild ischemic changes along the incision line.  There is no wound dehiscence.  Patient has some small amount of drainage from resolving hematoma.  Recommend continue with Dial soap cleansing daily dry dressing changes and wear the stump shrinker.

## 2023-08-13 NOTE — Progress Notes (Signed)
Occupational Therapy Weekly Progress Note  Patient Details  Name: Adam Ray MRN: [id number] Date of Birth: 05-12-1956  Beginning of progress report period: August 06, 2023 End of progress report period: August 13, 2023  Patient has met 4 of 4 short term goals. Pt is making appropriate progress towards LTGs. He is able to bathe, dress and complete toileting tasks with mod A overall. He has made improvements in his functional standing (min/mod A with RW) and transfers at [facility] level. Pt continues to demonstrate vision, dynamic standing, functional endurance, and GM coordination deficits resulting in difficulty completing BADL tasks without increased physical assist. Pt will benefit from continued skilled OT services to focus on mentioned deficits. Family ed not initiated as of date and will be needed in prep for DC.   Patient continues to demonstrate the following deficits: muscle weakness, decreased cardiorespiratoy endurance, decreased coordination, decreased visual acuity, decreased problem solving, and decreased standing balance and therefore will continue to benefit from skilled OT intervention to enhance overall performance with BADL and Reduce care partner burden.  Patient progressing toward long term goals..  Continue plan of care.  OT Short Term Goals Week 1:  OT Short Term Goal 1 (Week 1): Pt will complete sit > stand in prep for ADL with mod A using LRAD OT Short Term Goal 1 - Progress (Week 1): Met OT Short Term Goal 2 (Week 1): Pt will complete 1/3 toileting steps with mod A for balance OT Short Term Goal 2 - Progress (Week 1): Met OT Short Term Goal 3 (Week 1): Pt will donn LB clothing with mod A OT Short Term Goal 3 - Progress (Week 1): Met OT Short Term Goal 4 (Week 1): Pt will complete toilet transfer with mod A using LRAD OT Short Term Goal 4 - Progress (Week 1): Met Week 2:  OT Short Term Goal 1 (Week 2): STG = LTG due to ELOS   [name] E Roman Sandall, MS, OTR/L  08/13/2023,  12:13 PM

## 2023-08-13 NOTE — Progress Notes (Signed)
PROGRESS NOTE   Subjective/Complaints: No new complaints this morning, continues to have pain Appreciate Dr. Audrie Lia eval- hematoma resolving, updated patient's wife   ROS: Patient denies fever, rash, sore throat, blurred vision, dizziness, nausea, vomiting, diarrhea, cough, shortness of breath or chest pain,  headache, or mood change. +bleeding from residual limb incision- resolved  Objective:   No results found. Recent Labs    08/11/23 0552  WBC 4.7  HGB 8.7*  HCT 27.7*  PLT 258    Recent Labs    08/11/23 0552  NA 137  K 4.3  CL 96*  CO2 25  GLUCOSE 239*  BUN 21  CREATININE 1.14  CALCIUM 9.3     Intake/Output Summary (Last 24 hours) at 08/13/2023 1328 Last data filed at 08/13/2023 1320 Gross per 24 hour  Intake 480 ml  Output 1200 ml  Net -720 ml        Physical Exam: Vital Signs Blood pressure 107/62, pulse 70, temperature 98.8 F (37.1 C), resp. rate 18, height 5\' 10"  (1.778 m), weight 65.4 kg, SpO2 98%. Constitutional: No distress . Vital signs reviewed. HEENT: NCAT, EOMI, oral membranes moist Neck: supple Cardiovascular: RRR without murmur. No JVD    Respiratory/Chest: CTA Bilaterally without wheezes or rales. Normal effort    GI/Abdomen: BS +, non-tender, non-distended Ext: no clubbing, cyanosis, or edema Psych: pleasant and cooperative  Musculoskeletal:     Cervical back: Neck supple. No tenderness.       Ue's 5/5 and RLE 5/5 ending in BKA LLE- at least 3/5 - has L BKA  Skin:    Right BKA well healed on 2/12  Some superficial blistering along incision as below but no drainage. Pink epithelium under the blistered spots, bleeding resolved 2/11     Neurological:     General: No focal deficit present.     Mental Status: He is alert and oriented to person, place, and time. Seems to be cognitively appropriate on a basic level. No focal weakness.      Assessment/Plan: 1. Functional  deficits which require 3+ hours per day of interdisciplinary therapy in a comprehensive inpatient rehab setting. Physiatrist is providing close team supervision and 24 hour management of active medical problems listed below. Physiatrist and rehab team continue to assess barriers to discharge/monitor patient progress toward functional and medical goals  Care Tool:  Bathing    Body parts bathed by patient: Right arm, Left arm, Chest, Abdomen, Face   Body parts bathed by helper: Front perineal area, Buttocks, Right upper leg, Left upper leg, Face Body parts n/a: Right lower leg, Left lower leg (bilateral BKA)   Bathing assist Assist Level: Supervision/Verbal cueing     Upper Body Dressing/Undressing Upper body dressing   What is the patient wearing?: Pull over shirt    Upper body assist Assist Level: Moderate Assistance - Patient 50 - 74%    Lower Body Dressing/Undressing Lower body dressing      What is the patient wearing?: Incontinence brief, Pants     Lower body assist Assist for lower body dressing: Total Assistance - Patient < 25%     Toileting Toileting    Toileting assist Assist for  toileting: Maximal Assistance - Patient 25 - 49%     Transfers Chair/bed transfer  Transfers assist  Chair/bed transfer activity did not occur: Safety/medical concerns (unsafet to get up)  Chair/bed transfer assist level: Supervision/Verbal cueing (lateral scoot)     Locomotion Ambulation   Ambulation assist   Ambulation activity did not occur: Safety/medical concerns (pain, decreased balance on R prosthetic)  Assist level: 2 helpers Assistive device: Walker-rolling Max distance: 80ft   Walk 10 feet activity   Assist  Walk 10 feet activity did not occur: Safety/medical concerns (pain, decreased balance on R prosthetic)  Assist level: 2 helpers Assistive device: Walker-rolling   Walk 50 feet activity   Assist Walk 50 feet with 2 turns activity did not occur:  Safety/medical concerns (pain, decreased balance on R prosthetic)         Walk 150 feet activity   Assist Walk 150 feet activity did not occur: Safety/medical concerns (pain, decreased balance on R prosthetic)         Walk 10 feet on uneven surface  activity   Assist Walk 10 feet on uneven surfaces activity did not occur: Safety/medical concerns (pain, decreased balance on R prosthetic)         Wheelchair     Assist Is the patient using a wheelchair?: Yes Type of Wheelchair: Manual    Wheelchair assist level: Supervision/Verbal cueing Max wheelchair distance: 152ft    Wheelchair 50 feet with 2 turns activity    Assist        Assist Level: Supervision/Verbal cueing   Wheelchair 150 feet activity     Assist      Assist Level: Supervision/Verbal cueing   Blood pressure 107/62, pulse 70, temperature 98.8 F (37.1 C), resp. rate 18, height 5\' 10"  (1.778 m), weight 65.4 kg, SpO2 98%.  Medical Problem List and Plan: 1. 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             -ELOS/Goals: 10-12 days at w/c level  IPOC completed   Continue CIR therapies including PT, OT   2. Bleeding from residual limb incision: resolved, lovenox d/ced, continue aspirin   3. Pain Management: Tylenol, oxycodone d/ced due to codeine allergy, tramadol ordered prn   4. Hallucinations: discussed with wife that these are a side effect to codeine, oxycodone d/ced   5. Neuropsych/cognition: currently, patient is not  capable of making decisions on his  own behalf. Confused, but hasn't received pain meds today.    6. Skin/Wound Care: Routine skin care checks   7. Fluids/Electrolytes/Nutrition: Routine Is and Os and follow-up chemistries             -continue vit C, zinc supplements   8: Hypertension: monitor TID and prn             -increase toprol XL to 25mg  HS              -on multiple bladder meds   9: Hyperlipidemia: continue statin   10: ? OAB/? 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 Lajoyce Corners              -new shrinker was placed, continue   13: DM: CBGs QID, A1c = 10%             -  continue moderate scale SSI             -continue Novolog 4 units with meals             -continue metformin 500 mg BID with meals  -provided list of foods for diabetes   CBG (last 3)  Recent Labs    08/12/23 2045 08/13/23 0558 08/13/23 1152  GLUCAP 181* 208* 204*    Increase semglee to 22U  Increase metformin to 850mg  BID 14: History of bicuspid aortic valve, moderate AV stenosis   15: History of CAD s/p LHC with PCI/stent on 07/12/2018             -completed DAPT for 12 months; continue statin             -follows with Dr. Allyson Sabal   16: Pancytopenia: follows with Dr. Mosetta Putt and gets B12 injection monthly             -follow-up CBC     17. Constipation-    -add senna to colace   18. Confusion: d/c oxycodone---improved?  19, Hypotension: changed lopressor to Toprol XL 25mg  HS, decrease dose to 12.5mg , continue this dose  20. Fatigue: B12 ordered IM on 2/5, continue metanx  21. Polypharmacy: advised wife that we will provide her with a list of patient's medications  22. Hematoma: lovenox d/ced  23. Impaired mobility: vas [place] ordered  LOS: 8 days A FACE TO FACE EVALUATION WAS PERFORMED  Adam Ray 08/13/2023, 1:28 PM

## 2023-08-13 NOTE — Progress Notes (Signed)
Physical Therapy Session Note  Patient Details  Name: Adam Ray MRN: [id number] Date of Birth: 08/02/1955  Today's Date: 08/13/2023 PT Individual Time: [phone] and [id number] PT Individual Time Calculation (min): 41 min and 72 min  Short Term Goals: Week 1:  PT Short Term Goal 1 (Week 1): pt will perform bed mobility with min A PT Short Term Goal 2 (Week 1): pt will transfer bed<>chair with LRAD and mod A PT Short Term Goal 3 (Week 1): pt will transfer sit<>stand with LRAD and mod A  Skilled Therapeutic Interventions/Progress Updates:   Treatment Session 1 Received pt sitting in WC in dayroom - handoff from OTA. Pt agreeable to PT treatment and reported pain 8/10 in L residual limb (premedicated). Session with emphasis on functional mobility/transfers, generalized strengthening and endurance, dynamic standing balance/coordination, and gait training.  Pt instructed in transfer to/from mat and required supervision and verbal cues for WC parts management but no physical assist x 2 trials. Pt transferred onto mat via lateral scoot with supervision x 2 trials and therapist stabilizing WC. Stood from elevated EOB with RW and mod A x 2 trials (pt self selecting to push up with BUE support on RW) and required cues to reach back prior to sitting. Performed 2x5 standing tricep extensions with emphasis on clearing R foot with CGA for balance. Pt then ambulated 2ft with RW and light mod A with +2 for WC follow. Pt taking too large of steps with R foot but unable to control step length. Pt demonstrating posterior bias and landing at front of RW, requiring manual facilitation to advance RW forward to get center of mass over base of support. Pt then performed seated L knee extensions 2x15 with emphasis on contracture prevention. Donned limb guard with max A for time management purposes and returned to room. Concluded session with pt sitting in WC, needs within reach, and seatbelt alarm on. NT at bedside.    Treatment Session 2 Received pt semi-reclined in bed, pt agreeable to PT treatment, and reported pain 5/10 in L residual limb (premedicated). Session with emphasis on functional mobility/transfers, generalized strengthening and endurance, and mobility using electric scooter. Pt reported seeing Dr. Lajoyce Corners this morning and with numerous questions regarding hematoma L residual limb and requesting imaging - MD notified and increased time spent calming and reassuring pt. Pt transferred supine<>sitting L EOB from flat bed with supervision. Donned limb guard with supervision and increased time and R gel liner and prosthetic with set up assist. Pt transferred bed<>electric scooter via lateral scoot with CGA using multiple small scoots.  Pt propelled electric scooter >568ft with supervision/mod I around rehab unit. Navigated through obstacle course consisting of weaving in/out of cones forwards and backwards for a total of 4 trials. Pt running over 2 cones when propelling forward and running into fall a few times when propelling backwards. Returned to room and pt set scooter up for transfer back to bed but leaving too large of gap between scooter and bed. Pt demonstrating poor safety awareness and requesting therapist "back off". For safety, brought bed closer and provided min A for squat<>pivot transfer to bed (pt's L hip not completley on bed upon landing). Transferred sit<>supine<>sitting EOB with supervision and removed prosthetic and limb guard without assist. Pt performed supine SLR x12 bilaterally and sidelying hip abduction x12 bilaterally. Concluded session with pt semi-reclined in bed, needs within reach, and bed alarm on.   Therapy Documentation Precautions:  Precautions Precautions: Fall Precaution/Restrictions Comments: LLE Wound Vac Required  Braces or Orthoses: Other Brace Other Brace: LLE Limb Guard Restrictions Weight Bearing Restrictions Per Provider Order: Yes LLE Weight Bearing Per Provider  Order: Non weight bearing  Therapy/Group: Individual Therapy Marlana Salvage Zaunegger Blima Rich PT, DPT 08/13/2023, 6:52 AM

## 2023-08-13 NOTE — Patient Care Conference (Signed)
Inpatient RehabilitationTeam Conference and Plan of Care Update Date: 08/13/2023   Time: 11:52 AM    Patient Name: Adam Ray      Medical Record Number: [id number]  Date of Birth: 1955-08-12 Sex: Male         Room/Bed: 4W22C/4W22C-01 Payor Info: Payor: [employer] / Plan: [facility] / Product Type: *No Product type* /    Admit Date/Time:  08/05/2023  6:00 PM  Primary Diagnosis:  S/P BKA (below knee amputation) unilateral, left [facility])  Hospital Problems: Principal Problem:   S/P BKA (below knee amputation) unilateral, left (HCC) Active Problems:   S/P BKA (below knee amputation) [facility])    Expected Discharge Date: Expected Discharge Date: 08/20/23  Team Members Present: Physician leading conference: Dr. Sula Soda Social Worker Present: Cecile Sheerer, LCSWA Nurse Present: Chana Bode, RN PT Present: Blima Rich, PT OT Present: Candee Furbish, OT SLP Present: Jeannie Done, SLP PPS Coordinator present : Fae Pippin, SLP     Current Status/Progress Goal Weekly Team Focus  Bowel/Bladder      Continent of bowel and bladder          Swallow/Nutrition/ Hydration               ADL's   Min A UB, Mod A LB, Mod A toileting   Min A   Barriers- global deconditioning, cognitive impairments, initiation deficits, dynamic sitting/standing balance    Mobility   bed mobility supervision, lateral scoot transfers CGA, sit<>stands from elevated surfaces mod A, gait 55ft in // bars CGA, 1 6in step with 2 handrails mod A   supervision  barriers: pain, decreased balance strategies, stair navigation, limb loss education    Communication                Safety/Cognition/ Behavioral Observations               Pain      Pain managed with prn medications    < 4 with prns    Assess need for and effectiveness of medications for pain  Skin      Staple popped out of incision; some drainage now has subsided   Incision healing    Monitor  skin/incision q shift and assure shrinker applied as ordered    Discharge Planning:  Pt will d/c tohome with his s/o. Pt wife works PT M-F 8am-12:30pm. Pt wants all services through [place]. Pt is not service connected and only eligible for [facility] and DME. [facility] referral [facility]. WIll confirm there are no barriers to discharge.   Team Discussion: Patient   post left BKA limited by pain.   Patient on target to meet rehab goals: yes, currently needs min asisst for upper body care and mod assist for lower body care and toileting.  Still has difficulty with dynamic and static balance with overall deconditioning but cognition has improved  *See Care Plan and progress notes for long and short-term goals.   Revisions to Treatment Plan:  AP transfer training for toileting Transfers to/from the scooter   Teaching Needs: Safety, medications, skin care, transfers, toileting, etc.   Current Barriers to Discharge: Decreased caregiver support and Weight bearing restrictions  Possible Resolutions to Barriers: Family education HH follow up services DME: TTB , bari BSC and light weight wheelchair     Medical Summary Current Status: type 2 diabetes uncontrolled, postoperative pain  Barriers to Discharge: Medical stability  Barriers to Discharge Comments: type 2 diabetes uncontrolled, postoperative pain Possible Resolutions  to [employer] Focus: increase semglee, provided dietary education, advil increased to 200mg  q4H prn, educated regarding his current CBGs, continue CBG check   Continued Need for Acute Rehabilitation Level of Care: The patient requires daily medical management by a physician with specialized training in physical medicine and rehabilitation for the following reasons: Direction of a multidisciplinary physical rehabilitation program to maximize functional independence : Yes Medical management of patient stability for increased activity during participation in an intensive  rehabilitation regime.: Yes Analysis of laboratory values and/or radiology reports with any subsequent need for medication adjustment and/or medical intervention. : Yes   I attest that I was present, lead the team conference, and concur with the assessment and plan of the team.   Chana Bode B 08/13/2023, 1:16 PM

## 2023-08-13 NOTE — Progress Notes (Signed)
Occupational Therapy Session Note  Patient Details  Name: Adam Ray MRN: [id number] Date of Birth: [birth year]  Today's Date: 08/13/2023 OT Individual Time: 1005-1045 OT Individual Time Calculation (min): 40 min    Short Term Goals: Week 1:  OT Short Term Goal 1 (Week 1): Pt will complete sit > stand in prep for ADL with mod A using LRAD OT Short Term Goal 2 (Week 1): Pt will complete 1/3 toileting steps with mod A for balance OT Short Term Goal 3 (Week 1): Pt will donn LB clothing with mod A OT Short Term Goal 4 (Week 1): Pt will complete toilet transfer with mod A using LRAD  Skilled Therapeutic Interventions/Progress Updates:  Skilled OT intervention completed with focus on w/c mobility, dynamic standing balance, and stand pivot transfers. Pt received seated in w/c, agreeable to session. No pain reported.  Pt declined self care needs. Pt self propelled in w/c about 75 ft with supervision, then transported dependently rest of way <> gym.  Pt required cues for technique but able to doff leg rests, position w/c and laterally scoot with supervision > EOM.   Pt participated in the following dynamic standing balance and endurance tasks to promote independence and safety during BADLs and functional mobility: -retrieving squigz from long mirror. Min A sit > stand with RW all 3 trials, and CGA needed for balance when using RUE in task but min A for balance when using LUE; discussed relation to toileting tasks  Completed min A sit > stand then min/mod A stand pivot with RW with mod cueing for stepping back. Back in room, pt remained seated in w/c, with belt alarm on/activated, and with all needs in reach at end of session.   Therapy Documentation Precautions:  Precautions Precautions: Fall Required Braces or Orthoses: Other Brace Other Brace: LLE Limb Guard Restrictions Weight Bearing Restrictions Per Provider Order: Yes LLE Weight Bearing Per Provider Order: Non weight  bearing    Therapy/Group: Individual Therapy  Melvyn Novas, MS, OTR/L  08/13/2023, 11:57 AM

## 2023-08-13 NOTE — Progress Notes (Signed)
Occupational Therapy Session Note  Patient Details  Name: Adam Ray MRN: [id number] Date of Birth: 11-27-1955  Today's Date: 08/13/2023 OT Individual Time: 8657-8469 OT Individual Time Calculation (min): 57 min    Short Term Goals: Week 1:  OT Short Term Goal 1 (Week 1): Pt will complete sit > stand in prep for ADL with mod A using LRAD OT Short Term Goal 1 - Progress (Week 1): Met OT Short Term Goal 2 (Week 1): Pt will complete 1/3 toileting steps with mod A for balance OT Short Term Goal 2 - Progress (Week 1): Met OT Short Term Goal 3 (Week 1): Pt will donn LB clothing with mod A OT Short Term Goal 3 - Progress (Week 1): Met OT Short Term Goal 4 (Week 1): Pt will complete toilet transfer with mod A using LRAD OT Short Term Goal 4 - Progress (Week 1): Met  Skilled Therapeutic Interventions/Progress Updates:  Pt greeted supine in bed, pt agreeable to OT intervention.      Transfers/bed mobility/functional mobility:  Pt completed supine>sit to R side of bed with CGA. Pt completed lateral scoot to w/c to L side with CGA.   Therapeutic activity: pt completed standing tolerance task with pt instructed to stand at hi lo table to match cones to bean bags with an emphasis on unilateral standing balance and improving standing tolerance for ADL participation. Pt able to stand for 2 mins with MIN- MODA, heavy MODA to stand from w/c with cues for hand placement. Pt with difficulty reaching back when descending into chair.   Pt completed additional minute of standing with pt removing bean bags from cones with MIN A for standing balance and MODA to rise into standing. Pt with improved descent into chair with pt able to reach back this time.    ADLs:  Grooming: pt completed seated face washing at sink with supervision.  UB dressing:donned [place] shirt with set- up assist.  Footwear: pt donned R prosthetic from EOB with set- up assist. Pt donned L shrinker from EOB with light MINA  Education:  discussed ADL routine at home with pt reporting that he doesn't take showers at home and his w/c will fit into his bathroom to complete squat pivots to toilet. Pt reports that he changed his shrinker yesterday and that "dr Merla Riches" came to see him this AM and reported that residual limb is looking "good."      Handed pt off to PT for next session.                   Therapy Documentation Precautions:  Precautions Precautions: Fall Precaution/Restrictions Comments: LLE Wound Vac Required Braces or Orthoses: Other Brace Other Brace: LLE Limb Guard Restrictions Weight Bearing Restrictions Per Provider Order: Yes LLE Weight Bearing Per Provider Order: Non weight bearing  Pain: 10/10 pain reported in LLE, pain meds provided during session    Therapy/Group: Individual Therapy  Pollyann Glen [facility] 08/13/2023, 8:54 AM

## 2023-08-14 DIAGNOSIS — Z89512 Acquired absence of left leg below knee: Secondary | ICD-10-CM | POA: Diagnosis not present

## 2023-08-14 LAB — GLUCOSE, CAPILLARY
Glucose-Capillary: 115 mg/dL — ABNORMAL HIGH (ref 70–99)
Glucose-Capillary: 114 mg/dL — ABNORMAL HIGH (ref 70–99)
Glucose-Capillary: 137 mg/dL — ABNORMAL HIGH (ref 70–99)
Glucose-Capillary: 162 mg/dL — ABNORMAL HIGH (ref 70–99)

## 2023-08-14 MED ORDER — METFORMIN HCL 500 MG PO TABS
1000.0000 mg | ORAL_TABLET | Freq: Two times a day (BID) | ORAL | Status: DC
Start: 2023-08-14 — End: 2023-08-23
  Administered 2023-08-14 – 2023-08-23 (×18): 1000 mg via ORAL
  Filled 2023-08-14 (×18): qty 2

## 2023-08-14 MED ORDER — METOPROLOL SUCCINATE ER 25 MG PO TB24
12.5000 mg | ORAL_TABLET | Freq: Every day | ORAL | Status: DC
  Administered 2023-08-14 – 2023-08-17 (×4): 12.5 mg via ORAL
  Filled 2023-08-14 (×4): qty 1

## 2023-08-14 NOTE — Progress Notes (Signed)
Physical Therapy Session Note  Patient Details  Name: Adam Ray MRN: [id number] Date of Birth: 1956-02-06  Today's Date: 08/14/2023 PT Individual Time: 0730-0826 PT Individual Time Calculation (min): 56 min   Short Term Goals: Week 1:  PT Short Term Goal 1 (Week 1): pt will perform bed mobility with min A PT Short Term Goal 2 (Week 1): pt will transfer bed<>chair with LRAD and mod A PT Short Term Goal 3 (Week 1): pt will transfer sit<>stand with LRAD and mod A  Skilled Therapeutic Interventions/Progress Updates:   Received pt semi-reclined in bed, pt agreeable to PT treatment, and denied any pain during session. Session with emphasis on functional mobility/transfers, dressing, limb care, generalized strengthening and endurance, and simulated car transfers. Rolled L/R mod I using bedrails and donned clean brief with max A. Pt donned scrub shorts in long sitting<>supine with supervision and increased time. Pt removed shrinker without assist and therapist unwrapped limb - noted drainage from L lateral aspect of limb and edema. Attempted to locate Vashe, however pt reports his wife took it home - requested new bottle from MD to clean incision. Pt donned 4XL shrinker with supervision (cues to ensure writing on inside so copper/silver healing properties directly on incision). While pt donned shrinker, able to correctly direct therapist in how to wash dirty shrinker (cold water with non fragrance soap and laid to air dry).   Donned L limb guard with max A for time management purposes and pt transferred supine<>sitting L EOB from flat bed with supervision. Donned pull over shirt with supervision and increased time (noted posterior lean in unsupported sitting), gel liner and prosthetic with setup assist, then transferred onto electric scooter via multiple lateral scoots and close supervision. Pt propelled electric scooter >343ft x 2 trials with supervision to/from ortho gym. Pt performed simulated car  transfer via lateral scoot with supervision. Pt able to set scooter up for proper transfer and clear buttocks over large gap. Suggested slideboard, but pt politely declined. Pt reports his wife is selling their car and he will rely primarily on [employer] transportation services. Returned to room and concluded session with pt sitting in electric scooter, needs within reach, and seatbelt alarm on. Propped L residual limb up on pillows for edema management and for contracture prevention.   Therapy Documentation Precautions:  Precautions Precautions: Fall Precaution/Restrictions Comments: LLE Wound Vac Required Braces or Orthoses: Other Brace Other Brace: LLE Limb Guard Restrictions Weight Bearing Restrictions Per Provider Order: Yes LLE Weight Bearing Per Provider Order: Non weight bearing  Therapy/Group: Individual Therapy Marlana Salvage Zaunegger Blima Rich PT, DPT 08/14/2023, 6:53 AM

## 2023-08-14 NOTE — Progress Notes (Signed)
PROGRESS NOTE   Subjective/Complaints: Discussed patient's concern regarding clots- Vas [place] reviewed and is negative for clots Patient reports a little bleeding from incision site today   ROS: Patient denies fever, rash, sore throat, blurred vision, dizziness, nausea, vomiting, diarrhea, cough, shortness of breath or chest pain,  headache, or mood change. +bleeding from residual limb incision- improved  Objective:   VAS [place] LOWER EXTREMITY VENOUS (DVT) Result Date: 08/13/2023  Lower Venous DVT Study Patient Name:  Adam Ray  Date of Exam:   08/13/2023 Medical Rec #: [id number]     Accession #:    [id number] Date of Birth: 10/06/1955     Patient Gender: M Patient Age:   68 years Exam Location:  [facility] Procedure:      VAS [place] LOWER EXTREMITY VENOUS (DVT) Referring Phys: Sula Soda --------------------------------------------------------------------------------  Indications: Pain, and Swelling.  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 Comparison Study: 03/31/23 - Negative Performing Technologist: Alva Sink Sturdivant-Jones RDMS, RVT  Examination Guidelines: A complete evaluation includes B-mode imaging, spectral Doppler, color Doppler, and power Doppler as needed of all accessible portions of each vessel. Bilateral testing is considered an integral part of a complete examination. Limited examinations for reoccurring indications may be performed as noted. The reflux portion of the exam is performed with the patient in reverse Trendelenburg.  +---------+---------------+---------+-----------+----------+--------------+ RIGHT    CompressibilityPhasicitySpontaneityPropertiesThrombus Aging +---------+---------------+---------+-----------+----------+--------------+ CFV      Full           Yes      Yes                                  +---------+---------------+---------+-----------+----------+--------------+ SFJ      Full                                                        +---------+---------------+---------+-----------+----------+--------------+ FV Prox  Full                                                        +---------+---------------+---------+-----------+----------+--------------+ FV Mid   Full                                                        +---------+---------------+---------+-----------+----------+--------------+ FV DistalFull           Yes      Yes                                 +---------+---------------+---------+-----------+----------+--------------+ PFV      Full                                                        +---------+---------------+---------+-----------+----------+--------------+  POP      Full                                                        +---------+---------------+---------+-----------+----------+--------------+ PTV                                                   BKA            +---------+---------------+---------+-----------+----------+--------------+   +---------+---------------+---------+-----------+----------+--------------+ LEFT     CompressibilityPhasicitySpontaneityPropertiesThrombus Aging +---------+---------------+---------+-----------+----------+--------------+ CFV      Full           Yes      Yes                                 +---------+---------------+---------+-----------+----------+--------------+ SFJ      Full                                                        +---------+---------------+---------+-----------+----------+--------------+ FV Prox  Full                                                        +---------+---------------+---------+-----------+----------+--------------+ FV Mid   Full                                                         +---------+---------------+---------+-----------+----------+--------------+ FV DistalFull           Yes      Yes                                 +---------+---------------+---------+-----------+----------+--------------+ PFV      Full                                                        +---------+---------------+---------+-----------+----------+--------------+ POP      Full                                                        +---------+---------------+---------+-----------+----------+--------------+ PTV  BKA            +---------+---------------+---------+-----------+----------+--------------+     Summary: BILATERAL: - No evidence of deep vein thrombosis seen in the lower extremities, bilaterally. -   *See table(s) above for measurements and observations.    Preliminary    No results for input(s): "WBC", "HGB", "HCT", "PLT" in the last 72 hours.   No results for input(s): "NA", "K", "CL", "CO2", "GLUCOSE", "BUN", "CREATININE", "CALCIUM" in the last 72 hours.    Intake/Output Summary (Last 24 hours) at 08/14/2023 1350 Last data filed at 08/14/2023 1338 Gross per 24 hour  Intake 1320 ml  Output 1190 ml  Net 130 ml        Physical Exam: Vital Signs Blood pressure (!) 108/56, pulse (!) 59, temperature 97.7 F (36.5 C), temperature source Oral, resp. rate 17, height 5\' 10"  (1.778 m), weight 65.4 kg, SpO2 100%. Constitutional: No distress . Vital signs reviewed. HEENT: NCAT, EOMI, oral membranes moist Neck: supple Cardiovascular: RRR without murmur. No JVD    Respiratory/Chest: CTA Bilaterally without wheezes or rales. Normal effort    GI/Abdomen: BS +, non-tender, non-distended Ext: no clubbing, cyanosis, or edema Psych: pleasant and cooperative  Musculoskeletal:     Cervical back: Neck supple. No tenderness.       Ue's 5/5 and RLE 5/5 ending in BKA LLE- at least 3/5 - has L BKA  Skin:    Right BKA well  healed on 2/12  Some superficial blistering along incision as below but no drainage. Pink epithelium under the blistered spots, bleeding minimally present 2/13     Neurological:     General: No focal deficit present.     Mental Status: He is alert and oriented to person, place, and time. Seems to be cognitively appropriate on a basic level. No focal weakness.      Assessment/Plan: 1. Functional deficits which require 3+ hours per day of interdisciplinary therapy in a comprehensive inpatient rehab setting. Physiatrist is providing close team supervision and 24 hour management of active medical problems listed below. Physiatrist and rehab team continue to assess barriers to discharge/monitor patient progress toward functional and medical goals  Care Tool:  Bathing    Body parts bathed by patient: Right arm, Left arm, Chest, Abdomen, Face   Body parts bathed by helper: Front perineal area, Buttocks, Right upper leg, Left upper leg, Face Body parts n/a: Right lower leg, Left lower leg (bilateral BKA)   Bathing assist Assist Level: Supervision/Verbal cueing     Upper Body Dressing/Undressing Upper body dressing   What is the patient wearing?: Pull over shirt    Upper body assist Assist Level: Moderate Assistance - Patient 50 - 74%    Lower Body Dressing/Undressing Lower body dressing      What is the patient wearing?: Incontinence brief, Pants     Lower body assist Assist for lower body dressing: Total Assistance - Patient < 25%     Toileting Toileting    Toileting assist Assist for toileting: Maximal Assistance - Patient 25 - 49%     Transfers Chair/bed transfer  Transfers assist  Chair/bed transfer activity did not occur: Safety/medical concerns (unsafet to get up)  Chair/bed transfer assist level: Supervision/Verbal cueing (lateral scoot)     Locomotion Ambulation   Ambulation assist   Ambulation activity did not occur: Safety/medical concerns (pain,  decreased balance on R prosthetic)  Assist level: 2 helpers Assistive device: Walker-rolling Max distance: 5ft   Walk 10 feet activity  Assist  Walk 10 feet activity did not occur: Safety/medical concerns (pain, decreased balance on R prosthetic)  Assist level: 2 helpers Assistive device: Walker-rolling   Walk 50 feet activity   Assist Walk 50 feet with 2 turns activity did not occur: Safety/medical concerns (pain, decreased balance on R prosthetic)         Walk 150 feet activity   Assist Walk 150 feet activity did not occur: Safety/medical concerns (pain, decreased balance on R prosthetic)         Walk 10 feet on uneven surface  activity   Assist Walk 10 feet on uneven surfaces activity did not occur: Safety/medical concerns (pain, decreased balance on R prosthetic)         Wheelchair     Assist Is the patient using a wheelchair?: Yes Type of Wheelchair:  Armed forces technical officer)    Wheelchair assist level: Supervision/Verbal cueing Max wheelchair distance: >386ft    Wheelchair 50 feet with 2 turns activity    Assist        Assist Level: Supervision/Verbal cueing   Wheelchair 150 feet activity     Assist      Assist Level: Supervision/Verbal cueing   Blood pressure (!) 108/56, pulse (!) 59, temperature 97.7 F (36.5 C), temperature source Oral, resp. rate 17, height 5\' 10"  (1.778 m), weight 65.4 kg, SpO2 100%.  Medical Problem List and Plan: 1. 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             -ELOS/Goals: 10-12 days at w/c level  IPOC completed   Continue CIR therapies including PT, OT   2. Bleeding from residual limb incision: resolved, lovenox d/ced, continue aspirin   3. Pain Management: Tylenol, oxycodone d/ced due to codeine allergy, tramadol ordered prn   4. Hallucinations: discussed with wife that these are a side  effect to codeine, oxycodone d/ced   5. Neuropsych/cognition: currently, patient is not  capable of making decisions on his  own behalf. Confused, but hasn't received pain meds today.    6. Skin/Wound Care: Routine skin care checks   7. Fluids/Electrolytes/Nutrition: Routine Is and Os and follow-up chemistries             -continue vit C, zinc supplements   8: Hypertension: monitor TID and prn             -increase toprol XL to 25mg  HS             -on multiple bladder meds   9: Hyperlipidemia: continue statin   10: ? OAB/? 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 Lajoyce Corners              -new shrinker was placed, continue   13: DM: CBGs QID, A1c = 10%             -continue moderate scale SSI             -continue Novolog 4 units with meals             -increase metformin to 1,000mg  BID  -provided list of foods for diabetes   CBG (last 3)  Recent Labs    08/13/23 2058 08/14/23 0550 08/14/23 1135  GLUCAP 182* 115* 114*  Increase semglee to 22U  Increase metformin to 850mg  BID 14: History of bicuspid aortic valve, moderate AV stenosis   15: History of CAD s/p LHC with PCI/stent on 07/12/2018             -completed DAPT for 12 months; continue statin             -follows with Dr. Allyson Sabal   16: Pancytopenia: follows with Dr. Mosetta Putt and gets B12 injection monthly             -follow-up CBC     17. Constipation-    -add senna to colace   18. Confusion: d/c oxycodone---improved?  19, Hypotension: decrease Toprol XL to 12.5mg  HS  20. Fatigue: B12 ordered IM on 2/5, continue metanx  21. Polypharmacy: advised wife that we will provide her with a list of patient's medications  22. Hematoma: lovenox d/ced, continue aspirin  23. Impaired mobility: vas [place] ordered, reviewed and is negative for clots  LOS: 9 days A FACE TO FACE EVALUATION WAS PERFORMED  Adam Ray P Kayleanna Lorman 08/14/2023, 1:50 PM

## 2023-08-14 NOTE — Progress Notes (Signed)
Physical Therapy Weekly Progress Note  Patient Details  Name: Adam Ray MRN: [id number] Date of Birth: 1956-04-16  Beginning of progress report period: August 06, 2023 End of progress report period: August 14, 2023  Patient has met 3 of 3 short term goals.  Pt demonstrates steady progress towards long term goals. Pt is currently able to perform bed mobility from flat surface with supervision, lateral scoot transfers with CGA/close supervision, squat<>pivot with min A, sit<>stands with RW and mod A, and propel WC and electric scooter >194ft with supervision. Pt continues to be limited by pain, altered balance strategies, fatigue, decreased safety awareness, and deconditioning. Need to continue working on limb loss education in preparation for discharge.   Patient continues to demonstrate the following deficits muscle weakness, decreased cardiorespiratoy endurance, and decreased standing balance, decreased postural control, decreased balance strategies, and difficulty maintaining precautions and therefore will continue to benefit from skilled PT intervention to increase functional independence with mobility.  Patient progressing toward long term goals..  Continue plan of care.  PT Short Term Goals Week 1:  PT Short Term Goal 1 (Week 1): pt will perform bed mobility with min A PT Short Term Goal 1 - Progress (Week 1): Met PT Short Term Goal 2 (Week 1): pt will transfer bed<>chair with LRAD and mod A PT Short Term Goal 2 - Progress (Week 1): Met PT Short Term Goal 3 (Week 1): pt will transfer sit<>stand with LRAD and mod A PT Short Term Goal 3 - Progress (Week 1): Met Week 2:  PT Short Term Goal 1 (Week 2): STG=LTG due to LOS  Skilled Therapeutic Interventions/Progress Updates:  Ambulation/gait training;Discharge planning;Functional mobility training;Psychosocial support;Therapeutic Activities;Visual/perceptual remediation/compensation;Balance/vestibular training;Disease  management/prevention;Neuromuscular re-education;Skin care/wound management;Therapeutic Exercise;Wheelchair propulsion/positioning;Cognitive remediation/compensation;DME/adaptive equipment instruction;Pain management;Splinting/orthotics;UE/LE Strength taining/ROM;Community reintegration;Functional electrical stimulation;Patient/family education;Stair training;UE/LE Coordination activities   Therapy Documentation Precautions:  Precautions Precautions: Fall Precaution/Restrictions Comments: LLE Wound Vac Required Braces or Orthoses: Other Brace Other Brace: LLE Limb Guard Restrictions Weight Bearing Restrictions Per Provider Order: Yes LLE Weight Bearing Per Provider Order: Non weight bearing   Therapy/Group: Individual Therapy Marlana Salvage Zaunegger Blima Rich PT, DPT 08/14/2023, 7:25 AM

## 2023-08-14 NOTE — Progress Notes (Signed)
Physical Therapy Session Note  Patient Details  Name: Adam Ray MRN: [id number] Date of Birth: [birth year]  Today's Date: 08/14/2023 PT Individual Time: 1015-1127 PT Individual Time Calculation (min): 72 min   Short Term Goals: Week 2:  PT Short Term Goal 1 (Week 2): STG=LTG due to LOS  Skilled Therapeutic Interventions/Progress Updates:   Pt sitting in his electric scooter - agreeable to PT tx. Pt denies any pain.   He drives himself in scooter with supervision assist with min cues for safety awareness while turning into doorways and navigating tight spaces. Pt parked himself with too large of a gap while setting up to transfer - educated on fall prevention and safety while setting up transfers. He completed lateral scoot transfer from scooter to mat table with CGA.   Completed supine there-ex for BLE strengthening: -1x15 SLR -1x15 hip abd/add -2x15 hip ext/quad set into rolled towel at end of residual limb on LLE -2x15 glut sets -long sitting hamstring stretching, bilaterally -long sitting overhead press with 3# dowel rod 2x10 -long sitting trunk twists with 5# dumbbell 2x15 -sit ups 1x12  Pt assisted back to electric scooter with setupA for positioning the scooter and then minA for completing lateral scoot transfer - assist needed due to height difference b/w mat table and scooter.   Pt returned to his room and then assisted back to bed via lateral scoot transfer - CGA and improved positioning of scooter , closer to the bed. Removed limb guard and prosthesis without assist. All needs met at end of treatment.    Therapy Documentation Precautions:  Precautions Precautions: Fall Precaution/Restrictions Comments: LLE Wound Vac Required Braces or Orthoses: Other Brace Other Brace: LLE Limb Guard Restrictions Weight Bearing Restrictions Per Provider Order: Yes LLE Weight Bearing Per Provider Order: Non weight bearing General:     Therapy/Group: Individual  Therapy  Orrin Brigham 08/14/2023, 7:51 AM

## 2023-08-14 NOTE — Progress Notes (Signed)
Occupational Therapy Session Note  Patient Details  Name: Adam Ray MRN: [id number] Date of Birth: Jun 20, 1956  Today's Date: 08/14/2023 OT Individual Time: [phone] OT Individual Time Calculation (min): 57 min     Skilled Therapeutic Interventions/Progress Updates: Patient received sitting up in scooter. Agreeable to OT treatment. Patient able to safely guide his w/c to the therapy gym and park next to a mat for a SBA transfer to EOM. Sitting EOM worked on UE strength and endurance. Patient with good balance and UE strength. Able to transfer to higher and lower surfaced with only SBA/ set up assist. Patient motivated to return home. No bathroom equipment reported to be needed, but did state he would like a new power w/c because his scooter is not able to utilize transportation at times due to it's size. Continue with skilled OT POC to improve safety and endurance with ADL performance and functional transfers.      Therapy Documentation Precautions:  Precautions Precautions: Fall Precaution/Restrictions Comments: LLE Wound Vac Required Braces or Orthoses: Other Brace Other Brace: LLE Limb Guard Restrictions Weight Bearing Restrictions Per Provider Order: Yes LLE Weight Bearing Per Provider Order: Non weight bearing General:   Vital Signs: Therapy Vitals Temp: 97.7 F (36.5 C) Temp Source: Oral Pulse Rate: (!) 59 Resp: 17 BP: (!) 108/56 Patient Position (if appropriate): Lying Oxygen Therapy SpO2: 100 % O2 Device: Room Air Pain:6/10        Balance Good dynamic sitting balance.   Exercises: Dowel exercise working on UE strength and endurance for functional transfers with the use of a walker. Patient with good participation utilizing 4 lb dowel 2 x 10 in varied planes.      Therapy/Group: Individual Therapy  Warnell Forester 08/14/2023, 12:14 PM

## 2023-08-14 NOTE — Progress Notes (Addendum)
Patient ID: CHADD TOLLISON, male   DOB: Jul 11, 1955, 68 y.o.   MRN: [id number]  SW spoke with pt wife about HHA preference. No preference. Understands SW will work on finding an agency. Confirms she will be here for family edu tomorrow.   SW sent HHPT/OT/aide referral to [facility] and waiting on follow-up.  *no aide available.   Referral accepted by [facility]  SW updated [place] [place] SW on HHA as well so she can begin [place].   Cecile Sheerer, MSW, LCSW Office: [phone] Cell: [phone] Fax: [phone]

## 2023-08-15 DIAGNOSIS — Z89512 Acquired absence of left leg below knee: Secondary | ICD-10-CM | POA: Diagnosis not present

## 2023-08-15 LAB — GLUCOSE, CAPILLARY
Glucose-Capillary: 116 mg/dL — ABNORMAL HIGH (ref 70–99)
Glucose-Capillary: 124 mg/dL — ABNORMAL HIGH (ref 70–99)
Glucose-Capillary: 94 mg/dL (ref 70–99)
Glucose-Capillary: 136 mg/dL — ABNORMAL HIGH (ref 70–99)

## 2023-08-15 NOTE — Plan of Care (Signed)
  Problem: Consults Goal: RH LIMB LOSS PATIENT EDUCATION Description: Description: See Patient Education module for eduction specifics. Outcome: Progressing   Problem: RH BOWEL ELIMINATION Goal: RH STG MANAGE BOWEL WITH ASSISTANCE Description: STG Manage Bowel with mod I Assistance. Outcome: Progressing Goal: RH STG MANAGE BOWEL W/MEDICATION W/ASSISTANCE Description: STG Manage Bowel with Medication with mod I Assistance. Outcome: Progressing   Problem: RH BLADDER ELIMINATION Goal: RH STG MANAGE BLADDER WITH ASSISTANCE Description: STG Manage Bladder With mod I Assistance Outcome: Progressing Goal: RH STG MANAGE BLADDER WITH MEDICATION WITH ASSISTANCE Description: STG Manage Bladder With Medication With mod I Assistance. Outcome: Progressing   Problem: RH SKIN INTEGRITY Goal: RH STG SKIN FREE OF INFECTION/BREAKDOWN Description: Manage skin w min assist Outcome: Progressing   Problem: RH SAFETY Goal: RH STG ADHERE TO SAFETY PRECAUTIONS W/ASSISTANCE/DEVICE Description: STG Adhere to Safety Precautions With cues  Assistance/Device. Outcome: Progressing   Problem: RH PAIN MANAGEMENT Goal: RH STG PAIN MANAGED AT OR BELOW PT'S PAIN GOAL Description: Manage pain <4 with prns Outcome: Progressing   Problem: RH KNOWLEDGE DEFICIT LIMB LOSS Goal: RH STG INCREASE KNOWLEDGE OF SELF CARE AFTER LIMB LOSS Description: Patient and wife will be able to manage care at discharge using educational resources for medications and dietary modifications and skin care independently. Outcome: Progressing

## 2023-08-15 NOTE — Progress Notes (Signed)
PROGRESS NOTE   Subjective/Complaints: Asks for higher level amputation due to the muscle tightness he is experiencing, feels this will help strengthen his muscles, discussed that risks of higher level amputation will likely outweigh benefits for him   ROS: Patient denies fever, rash, sore throat, blurred vision, dizziness, nausea, vomiting, diarrhea, cough, shortness of breath or chest pain,  headache, or mood change. +bleeding from residual limb incision- improved, +muscle tightness in residual limb  Objective:   VAS [place] LOWER EXTREMITY VENOUS (DVT) Result Date: 08/14/2023  Lower Venous DVT Study Patient Name:  Adam Ray  Date of Exam:   08/13/2023 Medical Rec #: [id number]     Accession #:    [id number] Date of Birth: June 17, 1956     Patient Gender: M Patient Age:   68 years Exam Location:  [facility] Procedure:      VAS [place] LOWER EXTREMITY VENOUS (DVT) Referring Phys: Sula Soda --------------------------------------------------------------------------------  Indications: Pain, and Swelling.  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 Comparison Study: 03/31/23 - Negative Performing Technologist: Convoy Sink Sturdivant-Jones RDMS, RVT  Examination Guidelines: A complete evaluation includes B-mode imaging, spectral Doppler, color Doppler, and power Doppler as needed of all accessible portions of each vessel. Bilateral testing is considered an integral part of a complete examination. Limited examinations for reoccurring indications may be performed as noted. The reflux portion of the exam is performed with the patient in reverse Trendelenburg.  +---------+---------------+---------+-----------+----------+--------------+ RIGHT    CompressibilityPhasicitySpontaneityPropertiesThrombus Aging +---------+---------------+---------+-----------+----------+--------------+ CFV      Full            Yes      Yes                                 +---------+---------------+---------+-----------+----------+--------------+ SFJ      Full                                                        +---------+---------------+---------+-----------+----------+--------------+ FV Prox  Full                                                        +---------+---------------+---------+-----------+----------+--------------+ FV Mid   Full                                                        +---------+---------------+---------+-----------+----------+--------------+ FV DistalFull           Yes      Yes                                 +---------+---------------+---------+-----------+----------+--------------+  PFV      Full                                                        +---------+---------------+---------+-----------+----------+--------------+ POP      Full                                                        +---------+---------------+---------+-----------+----------+--------------+ PTV                                                   BKA            +---------+---------------+---------+-----------+----------+--------------+   +---------+---------------+---------+-----------+----------+--------------+ LEFT     CompressibilityPhasicitySpontaneityPropertiesThrombus Aging +---------+---------------+---------+-----------+----------+--------------+ CFV      Full           Yes      Yes                                 +---------+---------------+---------+-----------+----------+--------------+ SFJ      Full                                                        +---------+---------------+---------+-----------+----------+--------------+ FV Prox  Full                                                        +---------+---------------+---------+-----------+----------+--------------+ FV Mid   Full                                                         +---------+---------------+---------+-----------+----------+--------------+ FV DistalFull           Yes      Yes                                 +---------+---------------+---------+-----------+----------+--------------+ PFV      Full                                                        +---------+---------------+---------+-----------+----------+--------------+ POP      Full                                                        +---------+---------------+---------+-----------+----------+--------------+  PTV                                                   BKA            +---------+---------------+---------+-----------+----------+--------------+     Summary: BILATERAL: - No evidence of deep vein thrombosis seen in the lower extremities, bilaterally. -   *See table(s) above for measurements and observations. Electronically signed by Heath Lark on 08/14/2023 at 4:02:55 PM.    Final    No results for input(s): "WBC", "HGB", "HCT", "PLT" in the last 72 hours.   No results for input(s): "NA", "K", "CL", "CO2", "GLUCOSE", "BUN", "CREATININE", "CALCIUM" in the last 72 hours.    Intake/Output Summary (Last 24 hours) at 08/15/2023 1134 Last data filed at 08/15/2023 0820 Gross per 24 hour  Intake 1433 ml  Output 1390 ml  Net 43 ml        Physical Exam: Vital Signs Blood pressure 109/62, pulse 64, temperature 98.1 F (36.7 C), temperature source Oral, resp. rate 17, height 5\' 10"  (1.778 m), weight 65.4 kg, SpO2 98%. Constitutional: No distress . Vital signs reviewed. HEENT: NCAT, EOMI, oral membranes moist Neck: supple Cardiovascular: RRR without murmur. No JVD    Respiratory/Chest: CTA Bilaterally without wheezes or rales. Normal effort    GI/Abdomen: BS +, non-tender, non-distended Ext: no clubbing, cyanosis, or edema Psych: pleasant and cooperative  Musculoskeletal:     Cervical back: Neck supple. No tenderness.       Ue's 5/5 and RLE 5/5 ending in BKA LLE-  at least 3/5 - has L BKA  Skin:    Right BKA well healed on 2/14, hematoma present  Some superficial blistering along incision as below but no drainage. Pink epithelium under the blistered spots, bleeding minimally present 2/13     Neurological:     General: No focal deficit present.     Mental Status: He is alert and oriented to person, place, and time. Seems to be cognitively appropriate on a basic level. No focal weakness.      Assessment/Plan: 1. Functional deficits which require 3+ hours per day of interdisciplinary therapy in a comprehensive inpatient rehab setting. Physiatrist is providing close team supervision and 24 hour management of active medical problems listed below. Physiatrist and rehab team continue to assess barriers to discharge/monitor patient progress toward functional and medical goals  Care Tool:  Bathing    Body parts bathed by patient: Right arm, Left arm, Chest, Abdomen, Front perineal area, Right upper leg, Buttocks, Left upper leg, Face   Body parts bathed by helper: Front perineal area, Buttocks, Right upper leg, Left upper leg, Face Body parts n/a: Right lower leg, Left lower leg   Bathing assist Assist Level: Set up assist     Upper Body Dressing/Undressing Upper body dressing   What is the patient wearing?: Pull over shirt    Upper body assist Assist Level: Minimal Assistance - Patient > 75%    Lower Body Dressing/Undressing Lower body dressing      What is the patient wearing?: Incontinence brief, Pants     Lower body assist Assist for lower body dressing: Moderate Assistance - Patient 50 - 74%     Toileting Toileting    Toileting assist Assist for toileting: Maximal Assistance - Patient 25 - 49%     Transfers Chair/bed transfer  Transfers assist  Chair/bed transfer activity did not occur: Safety/medical concerns (unsafet to get up)  Chair/bed transfer assist level: Supervision/Verbal cueing (lateral scoot)      Locomotion Ambulation   Ambulation assist   Ambulation activity did not occur: Safety/medical concerns (pain, decreased balance on R prosthetic)  Assist level: 2 helpers Assistive device: Walker-rolling Max distance: 64ft   Walk 10 feet activity   Assist  Walk 10 feet activity did not occur: Safety/medical concerns (pain, decreased balance on R prosthetic)  Assist level: 2 helpers Assistive device: Walker-rolling   Walk 50 feet activity   Assist Walk 50 feet with 2 turns activity did not occur: Safety/medical concerns (pain, decreased balance on R prosthetic)         Walk 150 feet activity   Assist Walk 150 feet activity did not occur: Safety/medical concerns (pain, decreased balance on R prosthetic)         Walk 10 feet on uneven surface  activity   Assist Walk 10 feet on uneven surfaces activity did not occur: Safety/medical concerns (pain, decreased balance on R prosthetic)         Wheelchair     Assist Is the patient using a wheelchair?: Yes Type of Wheelchair:  Armed forces technical officer)    Wheelchair assist level: Supervision/Verbal cueing Max wheelchair distance: >369ft    Wheelchair 50 feet with 2 turns activity    Assist        Assist Level: Supervision/Verbal cueing   Wheelchair 150 feet activity     Assist      Assist Level: Supervision/Verbal cueing   Blood pressure 109/62, pulse 64, temperature 98.1 F (36.7 C), temperature source Oral, resp. rate 17, height 5\' 10"  (1.778 m), weight 65.4 kg, SpO2 98%.  Medical Problem List and Plan: 1. 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             -ELOS/Goals: 10-12 days at w/c level  IPOC completed   Continue CIR therapies including PT, OT   2. Bleeding from residual limb incision: resolved, lovenox d/ced, continue aspirin   3. Pain Management: Tylenol, oxycodone d/ced due  to codeine allergy, tramadol ordered prn   4. Hallucinations: discussed with wife that these are a side effect to codeine, oxycodone d/ced   5. Neuropsych/cognition: currently, patient is not  capable of making decisions on his  own behalf. Confused, but hasn't received pain meds today.    6. Skin/Wound Care: Routine skin care checks   7. Fluids/Electrolytes/Nutrition: Routine Is and Os and follow-up chemistries             -continue vit C, zinc supplements   8: Hypertension: monitor TID and prn             -increase toprol XL to 25mg  HS             -on multiple bladder meds   9: Hyperlipidemia: continue statin   10: ? OAB/? 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 Lajoyce Corners              -new shrinker was placed, continue   13: DM: CBGs QID, A1c = 10%             -continue moderate scale SSI             -  continue Novolog 4 units with meals             -increase metformin to 1,000mg  BID  -provided list of foods for diabetes   CBG (last 3)  Recent Labs    08/14/23 1659 08/14/23 2122 08/15/23 0613  GLUCAP 137* 162* 116*    Increase semglee to 22U  Increase metformin to 850mg  BID 14: History of bicuspid aortic valve, moderate AV stenosis   15: History of CAD s/p LHC with PCI/stent on 07/12/2018             -completed DAPT for 12 months; continue statin             -follows with Dr. Allyson Sabal   16: Pancytopenia: follows with Dr. Mosetta Putt and gets B12 injection monthly             -follow-up CBC     17. Constipation-    -add senna to colace   18. Confusion: d/c oxycodone---improved?  19, Hypotension: resolved, continue Toprol XL to 12.5mg  HS  20. Fatigue: B12 ordered IM on 2/5, continue metanx  21. Polypharmacy: advised wife that we will provide her with a list of patient's medications  22. Hematoma: lovenox d/ced, continue aspirin  23. Impaired mobility: vas [place] ordered, reviewed and is negative for clots,  discussed with patient  LOS: 10 days A FACE TO FACE EVALUATION WAS PERFORMED  Clint Bolder P Faye Sanfilippo 08/15/2023, 11:34 AM

## 2023-08-15 NOTE — Progress Notes (Signed)
Physical Therapy Session Note  Patient Details  Name: Adam Ray MRN: [id number] Date of Birth: Oct 14, 1955  Today's Date: 08/15/2023 PT Individual Time: 1100-1155 + 1448-1530 PT Individual Time Calculation (min): 55 min  + 42 min  Short Term Goals: Week 1:  PT Short Term Goal 1 (Week 1): pt will perform bed mobility with min A PT Short Term Goal 1 - Progress (Week 1): Met PT Short Term Goal 2 (Week 1): pt will transfer bed<>chair with LRAD and mod A PT Short Term Goal 2 - Progress (Week 1): Met PT Short Term Goal 3 (Week 1): pt will transfer sit<>stand with LRAD and mod A PT Short Term Goal 3 - Progress (Week 1): Met Week 2:  PT Short Term Goal 1 (Week 2): STG=LTG due to LOS  Skilled Therapeutic Interventions/Progress Updates:      1st session: Pt in bed to start - agreeable to therapy treatment. Has no c/o pain. Supine<>sitting EOB with supervision. Don's his prosthesis and limb guard with setupA - mild difficulty with velcro on limb guard. Completed lateral scoot transfer with CGA from EOB into wheelchair, transferring towards his R side. Propelled himself in manual wheelchair 171ft to main rehab gym with supervision assist - cues for increasing B shoulder extension to improve stroke efficiency. In // bars, facilitated gait training. Able to rise to stand with supervision while he pulls himself to standing. Able to hop the length of // bars x4 times (65ft) with CGA. He does require minA for turning within the // bars. Progressed gait to using the RW outside // bars. Pt completed gait training 2x31ft with CGA/minA and RW in straight path only. Pt begins to fatigue after ~8ft of gait. He demonstrates a hop-to gait pattern with posterior bias during initial contact with his RLE, needing minA for stability. Would benefit from non-linear gait challenges to work on turns and home environment navigation. Pt returned to his room at end of treatment, requesting to return to bed to rest. CGA for  lateral scoot transfer back to bed with min cues for safety awareness. Able to remove prosthesis and limb guard without assist. Bed mobility completed mod I. All needs met at end.   2nd session: Pt in room to start with his wife present for family education and training. Patient reports 8/10 pain in his L residual limb, asking for pain medication. LPN made aware of request via secure chat.   Wife voices her concerns and has repeated questions regarding follow up care - specifically she requests home health aid services while she's at work. She's upset with [employer] insurance and length of time to delivery DME or coordinate care with [employer]. She also feels that he hasn't been getting adequate therapy since admission, states he's only been getting 30 minute sessions. Reviewed daily schedules for the past 3 days, all having 45 - 75 minute treatment sessions. Wife also upset with CSW and feels that she's not getting her answers addressed and feels that they need more help with social aspects prior to DC. CSW aware via secure chat.  PT discussed PT goals, PT POC, anticipated DME rec's, etc. Wife asking for an electric PWC that will be small enough to fit in the home. She then asks for a light weight manual wheelchair with arm rests that flip back so that he can transfer more easily at home. She also makes it clear that the wheelchair at home does not have leg rests. Will relay concerns to primary team.  Remainder of session focused on family training and education. Transported patient to ortho gym to review car transfers. Wife reports patient will take [facility] transport home (sits in wheelchair during transport) so car transfer not needed. Pt requesting to complete the car transfer to demonstrate his ability - did so at [facility] level via lateral scoot transfer.  Demonstrate short distance gait to show the wife patient's abilities and limitations. Reiterated recommendation of wheelchair only level of mobility at home  until patient gets more balanced and stronger with standing mobility. Pt ambulated ~18ft with minA and RW with a hop to gait pattern with the wife providing a wheelchair follow for safety. Similar cues and deficits as noted in 1st session.   Returned to room and pt requesting to return to bed to rest. Completes lateral scoot transfer with close supervision but needing setupA for wheelchair management to transfer. Ended treatment with patient sitting EOB, wife present, all needs met.     Therapy Documentation Precautions:  Precautions Precautions: Fall Precaution/Restrictions Comments: LLE Wound Vac Required Braces or Orthoses: Other Brace Other Brace: LLE Limb Guard Restrictions Weight Bearing Restrictions Per Provider Order: Yes LLE Weight Bearing Per Provider Order: Non weight bearing General:    Therapy/Group: Individual Therapy  Dimas Scheck P Maxx Calaway PT 08/15/2023, 7:51 AM

## 2023-08-15 NOTE — Progress Notes (Signed)
Occupational Therapy Session Note  Patient Details  Name: Adam Ray MRN: [id number] Date of Birth: 1956/06/23  Today's Date: 08/15/2023 OT Individual Time: 0905-1000 & 4098-1191 OT Individual Time Calculation (min): 55 min & 42 min   Short Term Goals: Week 2:  OT Short Term Goal 1 (Week 2): STG = LTG due to ELOS  Skilled Therapeutic Interventions/Progress Updates:  Session 1 Skilled OT intervention completed with focus on ADL retraining, functional endurance, and mobility within a shower context. Pt received upright in bed, agreeable to session. No pain reported. OT offered pt a shower for BADLs.  Pt completed all lateral scoot transfers with CGA for level surfaces however min A for uneven or larger gaps without prosthesis donned. Verbal cues needed for technique and body positioning for safety.  Transitioned > EOB with mod I. Lateral scoot > w/c with + time due to pt without prosthesis donned however still effective transfer. Transported dependently in w/c > shower. Lateral scoot > TTB.  Waterproof cover applied to L BKA prior to shower. Pt was able to bathe all parts with set up A, at the seated level only. Pt laterally leaned to access periareas as applicable. Min A lateral scoot > w/c. Able to donn lotion/shirt/deo with set up A. Doffed waterproof bag and L BKA shrinker dependently. Pt noted to have earlier drainage on bed sheets with more than 20 spots of old/new blood, however upon removal of shrinker, noted to only have 1 spot with minimal bleeding. Nurse present for inspection. Per nursing order, OT cleansed wound with vashe solution, patted dry then donned new clean shrinker with mod A. Direct care handoff to nursing due to OT time constraint. Pt remained seated in w/c with all needs in reach at end of session.  Session 2 Skilled OT intervention completed with focus on family education with wife present. Pt received upright in bed, agreeable to session. No pain reported.  Pt's  wife had many complaints, with poor awareness noted. Pt's wife with repeated questions that have already been addressed by both OT/PT and CSW however re-education offered by present OT and CSW regarding importance of her finding assist for pt while she is at work, and the coordination of DME through the [place].  OT provided education on the following in prep for DC: -Recommended CGA/supervision for all lateral scoot transfers with/without prosthesis and up to min A ADLs  -Reviewed NWB on L limb precautions -Reviewed use of bariatric DABSC at bedside to reduce fall risk at night when pt is less alert or for urgency vs standard due to lateral scoot transfers and decreased safety when there is a larger gap in between -Dicussed time needed to heal in prep for prosthesis, and importance of wearing limb guard on LLE for safety with mobility when OOB -Discussed HHOT vs care giving/sitter -Discussed home set up, that since he will receive custom w/c after DC, he can reportedly use his currently owned w/c with flip back arm rests to access his bathroom. -Recommended TTB for tub/shower transfer with plan to trial different day but discussed how it is similar to bari [facility]  OT assisted pt in demonstrating the following during session: -supervision lateral scoot from EOB <> bari DABSC with cues for technique -set up A for donning RLE socket/prosthesis, and LLE limb guard   Wife assisted with the following: -CGA lateral scoot from EOB > w/c  Pt remained seated in w/c, with all needs in reach at end of session.   Therapy Documentation  Precautions:  Precautions Precautions: Fall Required Braces or Orthoses: Other Brace Other Brace: LLE Limb Guard Restrictions Weight Bearing Restrictions Per Provider Order: Yes LLE Weight Bearing Per Provider Order: Non weight bearing    Therapy/Group: Individual Therapy  Melvyn Novas, MS, OTR/L  08/15/2023, 3:17 PM

## 2023-08-16 DIAGNOSIS — Z89512 Acquired absence of left leg below knee: Secondary | ICD-10-CM | POA: Diagnosis not present

## 2023-08-16 LAB — BASIC METABOLIC PANEL
Anion gap: 9 (ref 5–15)
BUN: 23 mg/dL (ref 8–23)
CO2: 27 mmol/L (ref 22–32)
Calcium: 9 mg/dL (ref 8.9–10.3)
Chloride: 102 mmol/L (ref 98–111)
Creatinine, Ser: 1.46 mg/dL — ABNORMAL HIGH (ref 0.61–1.24)
GFR, Estimated: 52 mL/min — ABNORMAL LOW (ref >60)
Glucose, Bld: 126 mg/dL — ABNORMAL HIGH (ref 70–99)
Potassium: 4.3 mmol/L (ref 3.5–5.1)
Sodium: 138 mmol/L (ref 135–145)

## 2023-08-16 LAB — GLUCOSE, CAPILLARY
Glucose-Capillary: 122 mg/dL — ABNORMAL HIGH (ref 70–99)
Glucose-Capillary: 156 mg/dL — ABNORMAL HIGH (ref 70–99)
Glucose-Capillary: 106 mg/dL — ABNORMAL HIGH (ref 70–99)
Glucose-Capillary: 139 mg/dL — ABNORMAL HIGH (ref 70–99)

## 2023-08-16 LAB — CBC WITH DIFFERENTIAL/PLATELET
Abs Immature Granulocytes: 0.01 10*3/uL (ref 0.00–0.07)
Basophils Absolute: 0.1 10*3/uL (ref 0.0–0.1)
Basophils Relative: 2 %
Eosinophils Absolute: 0.2 10*3/uL (ref 0.0–0.5)
Eosinophils Relative: 6 %
HCT: 27.2 % — ABNORMAL LOW (ref 39.0–52.0)
Hemoglobin: 8.6 g/dL — ABNORMAL LOW (ref 13.0–17.0)
Immature Granulocytes: 0 %
Lymphocytes Relative: 26 %
Lymphs Abs: 0.9 10*3/uL (ref 0.7–4.0)
MCH: 27.2 pg (ref 26.0–34.0)
MCHC: 31.6 g/dL (ref 30.0–36.0)
MCV: 86.1 fL (ref 80.0–100.0)
Monocytes Absolute: 0.4 10*3/uL (ref 0.1–1.0)
Monocytes Relative: 11 %
Neutro Abs: 1.9 10*3/uL (ref 1.7–7.7)
Neutrophils Relative %: 55 %
Platelets: 264 10*3/uL (ref 150–400)
RBC: 3.16 MIL/uL — ABNORMAL LOW (ref 4.22–5.81)
RDW: 15.2 % (ref 11.5–15.5)
WBC: 3.4 10*3/uL — ABNORMAL LOW (ref 4.0–10.5)
nRBC: 0 % (ref 0.0–0.2)

## 2023-08-16 NOTE — Plan of Care (Signed)
  Problem: Consults Goal: RH LIMB LOSS PATIENT EDUCATION Description: Description: See Patient Education module for eduction specifics. Outcome: Progressing   Problem: RH BOWEL ELIMINATION Goal: RH STG MANAGE BOWEL WITH ASSISTANCE Description: STG Manage Bowel with mod I Assistance. Outcome: Progressing Goal: RH STG MANAGE BOWEL W/MEDICATION W/ASSISTANCE Description: STG Manage Bowel with Medication with mod I Assistance. Outcome: Progressing   Problem: RH BLADDER ELIMINATION Goal: RH STG MANAGE BLADDER WITH ASSISTANCE Description: STG Manage Bladder With mod I Assistance Outcome: Progressing Goal: RH STG MANAGE BLADDER WITH MEDICATION WITH ASSISTANCE Description: STG Manage Bladder With Medication With mod I Assistance. Outcome: Progressing   Problem: RH SKIN INTEGRITY Goal: RH STG SKIN FREE OF INFECTION/BREAKDOWN Description: Manage skin w min assist Outcome: Progressing   Problem: RH SAFETY Goal: RH STG ADHERE TO SAFETY PRECAUTIONS W/ASSISTANCE/DEVICE Description: STG Adhere to Safety Precautions With cues  Assistance/Device. Outcome: Progressing   Problem: RH PAIN MANAGEMENT Goal: RH STG PAIN MANAGED AT OR BELOW PT'S PAIN GOAL Description: Manage pain <4 with prns Outcome: Progressing   Problem: RH KNOWLEDGE DEFICIT LIMB LOSS Goal: RH STG INCREASE KNOWLEDGE OF SELF CARE AFTER LIMB LOSS Description: Patient and wife will be able to manage care at discharge using educational resources for medications and dietary modifications and skin care independently. Outcome: Progressing

## 2023-08-16 NOTE — Progress Notes (Signed)
PROGRESS NOTE   Subjective/Complaints: No events overnight.  Some ongoing left lower extremity pain.  No other complaints today. Vitals significant for some mild hypotension, 100s over 60s Blood sugars well-controlled. Eating 100% of meals. Continent of bowel bladder; last bowel movement 2-14  ROS: Patient denies fever, rash, sore throat, blurred vision, dizziness, nausea, vomiting, diarrhea, cough, shortness of breath or chest pain,  headache, or mood change.  +bleeding from residual limb incision- improved,  +muscle tightness in residual limb-ongoing  Objective:   No results found.  No results for input(s): "WBC", "HGB", "HCT", "PLT" in the last 72 hours.   No results for input(s): "NA", "K", "CL", "CO2", "GLUCOSE", "BUN", "CREATININE", "CALCIUM" in the last 72 hours.    Intake/Output Summary (Last 24 hours) at 08/16/2023 1107 Last data filed at 08/16/2023 0931 Gross per 24 hour  Intake 714 ml  Output 1000 ml  Net -286 ml        Physical Exam: Vital Signs Blood pressure (!) 100/56, pulse 64, temperature 98.5 F (36.9 C), resp. rate 17, height 5\' 10"  (1.778 m), weight 65.4 kg, SpO2 98%. Constitutional: No distress . Vital signs reviewed. HEENT: NCAT, EOMI, oral membranes moist Neck: supple Cardiovascular: RRR without murmur. No JVD    Respiratory/Chest: CTA Bilaterally without wheezes or rales. Normal effort    GI/Abdomen: BS +, non-tender, non-distended Ext: no clubbing, cyanosis, or edema Psych: pleasant and cooperative  Musculoskeletal:     Cervical back: Neck supple. No tenderness.   Ue's 5/5 and RLE 5/5 ending in BKA LLE- at least 3/5 - has L BKA   Skin:    Right BKA well healed on 2/14   2-15: Appears some increased necrotic tissue along incisional line compared to last photograph, with a few areas of bleeding/adherent to shrinker sock.  No obvious discharge or dehiscence with palpation.  2+ edema  throughout distal limb.       Neurological:     General: No focal deficit present.     Mental Status: He is alert and oriented to person, place, and time. Seems to be cognitively appropriate on a basic level--some mild delays. No focal weakness.      Assessment/Plan: 1. Functional deficits which require 3+ hours per day of interdisciplinary therapy in a comprehensive inpatient rehab setting. Physiatrist is providing close team supervision and 24 hour management of active medical problems listed below. Physiatrist and rehab team continue to assess barriers to discharge/monitor patient progress toward functional and medical goals  Care Tool:  Bathing    Body parts bathed by patient: Right arm, Left arm, Chest, Abdomen, Front perineal area, Right upper leg, Buttocks, Left upper leg, Face   Body parts bathed by helper: Front perineal area, Buttocks, Right upper leg, Left upper leg, Face Body parts n/a: Right lower leg, Left lower leg   Bathing assist Assist Level: Set up assist     Upper Body Dressing/Undressing Upper body dressing   What is the patient wearing?: Pull over shirt    Upper body assist Assist Level: Minimal Assistance - Patient > 75%    Lower Body Dressing/Undressing Lower body dressing      What is the patient wearing?:  Incontinence brief, Pants     Lower body assist Assist for lower body dressing: Moderate Assistance - Patient 50 - 74%     Toileting Toileting    Toileting assist Assist for toileting: Maximal Assistance - Patient 25 - 49%     Transfers Chair/bed transfer  Transfers assist  Chair/bed transfer activity did not occur: Safety/medical concerns (unsafet to get up)  Chair/bed transfer assist level: Supervision/Verbal cueing (lateral scoot)     Locomotion Ambulation   Ambulation assist   Ambulation activity did not occur: Safety/medical concerns (pain, decreased balance on R prosthetic)  Assist level: 2 helpers Assistive device:  Walker-rolling Max distance: 22ft   Walk 10 feet activity   Assist  Walk 10 feet activity did not occur: Safety/medical concerns (pain, decreased balance on R prosthetic)  Assist level: 2 helpers Assistive device: Walker-rolling   Walk 50 feet activity   Assist Walk 50 feet with 2 turns activity did not occur: Safety/medical concerns (pain, decreased balance on R prosthetic)         Walk 150 feet activity   Assist Walk 150 feet activity did not occur: Safety/medical concerns (pain, decreased balance on R prosthetic)         Walk 10 feet on uneven surface  activity   Assist Walk 10 feet on uneven surfaces activity did not occur: Safety/medical concerns (pain, decreased balance on R prosthetic)         Wheelchair     Assist Is the patient using a wheelchair?: Yes Type of Wheelchair:  Armed forces technical officer)    Wheelchair assist level: Supervision/Verbal cueing Max wheelchair distance: >381ft    Wheelchair 50 feet with 2 turns activity    Assist        Assist Level: Supervision/Verbal cueing   Wheelchair 150 feet activity     Assist      Assist Level: Supervision/Verbal cueing   Blood pressure (!) 100/56, pulse 64, temperature 98.5 F (36.9 C), resp. rate 17, height 5\' 10"  (1.778 m), weight 65.4 kg, SpO2 98%.  Medical Problem List and Plan: 1. 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             -ELOS/Goals: 10-12 days at w/c level  IPOC completed   Continue CIR therapies including PT, OT   2. Bleeding from residual limb incision: resolved, lovenox d/ced, continue aspirin   3. Pain Management: Tylenol, oxycodone d/ced due to codeine allergy, tramadol ordered prn   -2-15: Using Tramadol and Advil consistently; refused Robaxin overnight, is having ongoing hypotension anyway so we will DC  4. Hallucinations: discussed with wife that  these are a side effect to codeine, oxycodone d/ced   5. Neuropsych/cognition: currently, patient is not  capable of making decisions on his  own behalf. Confused, but hasn't received pain meds today.    6. Skin/Wound Care: Routine skin care checks   -2-15: Some skin breakdown along incisional site with adherence to shrinker sock; changed wound care orders to petroleum infused gauze along incision line, reinforced with dry gauze and tape to prevent debridement when shrinker sock is donned/doffed  7. Fluids/Electrolytes/Nutrition: Routine Is and Os and follow-up chemistries             -continue vit C, zinc supplements   8: Hypertension: monitor TID and prn             -  increase toprol XL to 25mg  HS             -on multiple bladder meds  - 2/15: Ongoing hypotension.  Heart rate well-controlled 60s to 70s.  Toprol was already reduced to 12.5 mg nightly on Thursday.  Placed hold parameters for SBP less than 90, DBP less than 50.   9: Hyperlipidemia: continue statin   10: ? OAB/? 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 Lajoyce Corners  -new shrinker was placed, continue  -2-15: Area of skin breakdown does seem slightly increased from admission, however no concerning findings of dehiscence or infection.  Paged vascular PA on-call to review photos when able.   13: DM: CBGs QID, A1c = 10%             -continue moderate scale SSI             -continue Novolog 4 units with meals             -increase metformin to 1,000mg  BID  -provided list of foods for diabetes   CBG (last 3)  Recent Labs    08/15/23 1636 08/15/23 2132 08/16/23 0629  GLUCAP 94 136* 122*    Increase semglee to 22U  Increase metformin to 850mg  BID  -2-15: Blood sugars well-controlled.  Continue current regimen  14: History of bicuspid aortic valve, moderate AV stenosis   15: History of CAD s/p LHC with PCI/stent on 07/12/2018             -completed DAPT for  12 months; continue statin             -follows with Dr. Allyson Sabal   16: Pancytopenia: follows with Dr. Mosetta Putt and gets B12 injection monthly             -follow-up CBC   17. Constipation-    -add senna to colace  -Having regular bowel movements  18. Confusion: d/c oxycodone---improved?  -Stable on tramadol  19, Hypotension: resolved, continue Toprol XL to 12.5mg  HS  See number-8 above  20. Fatigue: B12 ordered IM on 2/5, continue metanx  21. Polypharmacy: advised wife that we will provide her with a list of patient's medications  22. Hematoma: lovenox d/ced, continue aspirin  23. Impaired mobility: vas [place] ordered, reviewed and is negative for clots, discussed with patient  LOS: 11 days A FACE TO FACE EVALUATION WAS PERFORMED  Angelina Sheriff 08/16/2023, 11:07 AM

## 2023-08-17 DIAGNOSIS — Z89512 Acquired absence of left leg below knee: Secondary | ICD-10-CM | POA: Diagnosis not present

## 2023-08-17 LAB — GLUCOSE, CAPILLARY
Glucose-Capillary: 119 mg/dL — ABNORMAL HIGH (ref 70–99)
Glucose-Capillary: 119 mg/dL — ABNORMAL HIGH (ref 70–99)
Glucose-Capillary: 106 mg/dL — ABNORMAL HIGH (ref 70–99)
Glucose-Capillary: 134 mg/dL — ABNORMAL HIGH (ref 70–99)

## 2023-08-17 MED ORDER — TRAMADOL HCL 50 MG PO TABS
50.0000 mg | ORAL_TABLET | Freq: Two times a day (BID) | ORAL | Status: DC | PRN
  Administered 2023-08-17 – 2023-08-19 (×3): 100 mg via ORAL
  Filled 2023-08-17 (×3): qty 2

## 2023-08-17 NOTE — Progress Notes (Signed)
Physical Therapy Session Note  Patient Details  Name: Adam Ray MRN: [id number] Date of Birth: [birth year]  Today's Date: 08/17/2023 PT Individual Time: 1102-1203 PT Individual Time Calculation (min): 61 min   Short Term Goals: Week 2:  PT Short Term Goal 1 (Week 2): STG=LTG due to LOS  Skilled Therapeutic Interventions/Progress Updates:      Pt supine in bed upon arrival. Pt agreeable to therapy. Pt reports 8/10 pain in L LE at site of incision, increased to 8/10 with dressing change. MD present for AM rounds to assess site and change dressing.   Utilized saline to [facility] with total A. Of note: bleeding on medial aspect of incision.   While MD doffed/donned new dressing, Discussed importance/frequency of changing shrinker. Pt able to recall frequency, as well as how to clean shrinker. Discussed importance of performing skin inspection daily. Pt attempted to perform with skin inspection mirror however unable to see 2/2 visual deficits. Pt reports he has glasses at home, recommended pt have wife bring them in. Also recommended pt wife perform skin inspection each day.   Pt practiced donning/doffing shrinker, pt donned/doffed with min A progressing to supervision and increased time. Verbal cues provided technique.   Pt wife called during session (after MD left) , pt reports continued bleeding at incision site. PT present to report dressing has been changed, and MD reports MD plan for vascular to following up this week, emphasized no signs and symptoms of infection at this time. Pt wife requesting Dr. Lajoyce Corners follow up with pt. Notified MD via secure chat.   Pt donned pants with supervision while supine in bed, verbal cues provided for lowering HOB to allow improved mobility for rolling bilaterally. Donned L LE limb protector and R LE prosthesis with supervision, verbal cues for proper technique. Pt performed bed to chair transfer with CGA/min A.   Pt requesting to stay in chair to eat  lunch. Pt seated in WC at end of session with seatbelt alarm on and needs within reach.        Therapy Documentation Precautions:  Precautions Precautions: Fall Precaution/Restrictions Comments: LLE Wound Vac Required Braces or Orthoses: Other Brace Other Brace: LLE Limb Guard Restrictions Weight Bearing Restrictions Per Provider Order: Yes LLE Weight Bearing Per Provider Order: Non weight bearing   Therapy/Group: Individual Therapy  [facility] [place], [place], DPT  08/17/2023, 11:12 AM

## 2023-08-17 NOTE — Progress Notes (Signed)
PROGRESS NOTE   Subjective/Complaints: 8 out of 10 leg pain recorded overnight, constant.  Patient states that the pain has not improved, but that he is able to work with it.  He remains concerned about ongoing bleeding in the distal limb, nursing also reporting a dime sized blood clot out today.  Patient reporting new vision deficits since his surgery, states that he "cannot see anything".  Does normally wear glasses, which she has not had access to.  He does seem a little more confused today, attempts to demonstrate donning/doffing shrinker sock and struggles considerably.  Vital stable. Monitor bowel bladder, last bowel movement 2-15  ROS: Patient denies fever, rash, sore throat, blurred vision, dizziness, nausea, vomiting, diarrhea, cough, shortness of breath or chest pain,  headache, or mood change.  +bleeding from residual limb incision-ongoing +muscle tightness in residual limb-ongoing + Vision changes  Objective:   No results found.  Recent Labs    08/16/23 1409  WBC 3.4*  HGB 8.6*  HCT 27.2*  PLT 264     Recent Labs    08/16/23 1409  NA 138  K 4.3  CL 102  CO2 27  GLUCOSE 126*  BUN 23  CREATININE 1.46*  CALCIUM 9.0      Intake/Output Summary (Last 24 hours) at 08/17/2023 1002 Last data filed at 08/17/2023 0829 Gross per 24 hour  Intake 717 ml  Output 1600 ml  Net -883 ml        Physical Exam: Vital Signs Blood pressure 110/62, pulse (!) 57, temperature 98.5 F (36.9 C), resp. rate 17, height 5\' 10"  (1.778 m), weight 65.4 kg, SpO2 97%. Constitutional: No distress . Vital signs reviewed.  Laying in bed. HEENT: NCAT, EOMI.  Unable to complete visual field testing throughout all fields in right eye, lateral fields and left eye.  Does blink to threat in all 4 quadrants.  Pupils constricted but equal and reactive.  Neck: supple Cardiovascular: RRR without murmur. No JVD    Respiratory/Chest: CTA  Bilaterally without wheezes or rales. Normal effort    GI/Abdomen: BS +, non-tender, non-distended Ext: no clubbing, cyanosis; 2+ edema left lower extremity Psych: pleasant and cooperative  MSK: Bilateral BKA's  Skin:  2-15: Appears some increased necrotic tissue along incisional line compared to last photograph, with a few areas of bleeding/adherent to shrinker sock.  No obvious discharge or dehiscence with palpation.  2+ edema throughout distal limb.  2-16: Overlying gauze with mild amount of blood, redressed with Xeroform underlying.  No expressible drainage.       Neurological:     General: No focal deficit present.     Mental Status: He is alert and oriented to person, place, and time. Seems to be cognitively appropriate on a basic level--some mild delays. No focal weakness. --Consistent 2-16     Assessment/Plan: 1. Functional deficits which require 3+ hours per day of interdisciplinary therapy in a comprehensive inpatient rehab setting. Physiatrist is providing close team supervision and 24 hour management of active medical problems listed below. Physiatrist and rehab team continue to assess barriers to discharge/monitor patient progress toward functional and medical goals  Care Tool:  Bathing    Body parts  bathed by patient: Right arm, Left arm, Chest, Abdomen, Front perineal area, Right upper leg, Buttocks, Left upper leg, Face   Body parts bathed by helper: Front perineal area, Buttocks, Right upper leg, Left upper leg, Face Body parts n/a: Right lower leg, Left lower leg   Bathing assist Assist Level: Set up assist     Upper Body Dressing/Undressing Upper body dressing   What is the patient wearing?: Pull over shirt    Upper body assist Assist Level: Minimal Assistance - Patient > 75%    Lower Body Dressing/Undressing Lower body dressing      What is the patient wearing?: Incontinence brief, Pants     Lower body assist Assist for lower body dressing:  Moderate Assistance - Patient 50 - 74%     Toileting Toileting    Toileting assist Assist for toileting: Maximal Assistance - Patient 25 - 49%     Transfers Chair/bed transfer  Transfers assist  Chair/bed transfer activity did not occur: Safety/medical concerns (unsafet to get up)  Chair/bed transfer assist level: Supervision/Verbal cueing (lateral scoot)     Locomotion Ambulation   Ambulation assist   Ambulation activity did not occur: Safety/medical concerns (pain, decreased balance on R prosthetic)  Assist level: 2 helpers Assistive device: Walker-rolling Max distance: 42ft   Walk 10 feet activity   Assist  Walk 10 feet activity did not occur: Safety/medical concerns (pain, decreased balance on R prosthetic)  Assist level: 2 helpers Assistive device: Walker-rolling   Walk 50 feet activity   Assist Walk 50 feet with 2 turns activity did not occur: Safety/medical concerns (pain, decreased balance on R prosthetic)         Walk 150 feet activity   Assist Walk 150 feet activity did not occur: Safety/medical concerns (pain, decreased balance on R prosthetic)         Walk 10 feet on uneven surface  activity   Assist Walk 10 feet on uneven surfaces activity did not occur: Safety/medical concerns (pain, decreased balance on R prosthetic)         Wheelchair     Assist Is the patient using a wheelchair?: Yes Type of Wheelchair:  Armed forces technical officer)    Wheelchair assist level: Supervision/Verbal cueing Max wheelchair distance: >372ft    Wheelchair 50 feet with 2 turns activity    Assist        Assist Level: Supervision/Verbal cueing   Wheelchair 150 feet activity     Assist      Assist Level: Supervision/Verbal cueing   Blood pressure 110/62, pulse (!) 57, temperature 98.5 F (36.9 C), resp. rate 17, height 5\' 10"  (1.778 m), weight 65.4 kg, SpO2 97%.  Medical Problem List and Plan: 1. 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             -ELOS/Goals: 10-12 days at w/c level  IPOC completed   Continue CIR therapies including PT, OT   2. Bleeding from residual limb incision: resolved, lovenox d/ced, continue aspirin   3. Pain Management: Tylenol, oxycodone d/ced due to codeine allergy, tramadol ordered prn   -2-15: Using Tramadol and Advil consistently; refused Robaxin overnight, is having ongoing hypotension anyway so we will DC  2-16: BPs improved, but 8 out of 10 pain overnight.  Increase tramadol to 50 to 100 mg as  needed  4. Hallucinations: discussed with wife that these are a side effect to codeine, oxycodone d/ced    5. Neuropsych/cognition: currently, patient is not  capable of making decisions on his  own behalf. Confused, but hasn't received pain meds today.    -2-16: Some ongoing mild confusion, but no gross deficits on exam today  6. Skin/Wound Care: Routine skin care checks   -2-15: Some skin breakdown and bleeding along incisional site with adherence to shrinker sock; changed wound care orders to petroleum infused gauze along incision line, reinforced with dry kerlex and tape   7. Fluids/Electrolytes/Nutrition: Routine Is and Os and follow-up chemistries             -continue vit C, zinc supplements   8: Hypertension: monitor TID and prn             -increase toprol XL to 25mg  HS             -on multiple bladder meds  - 2/15: Ongoing hypotension.  Heart rate well-controlled 60s to 70s.  Toprol was already reduced to 12.5 mg nightly on Thursday.  Placed hold parameters for SBP less than 90, DBP less than 50.  2-16: BPs improved   9: Hyperlipidemia: continue statin   10: ? OAB/? 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 Lajoyce Corners  -new shrinker was placed, continue  -2-15: Area of skin breakdown does seem slightly increased  from admission, however no concerning findings of dehiscence or infection.  Photos placed in chart.  Dressings changed as above.  Will reexamine in a.m.  2/16: D/w Vascular Lianne Cure, PA, will see today or tomorrow for wound monitoring given skin breakdown and continued bleeding   13: DM: CBGs QID, A1c = 10%             -continue moderate scale SSI             -continue Novolog 4 units with meals             -increase metformin to 1,000mg  BID  -provided list of foods for diabetes   CBG (last 3)  Recent Labs    08/16/23 1620 08/16/23 2104 08/17/23 0612  GLUCAP 106* 139* 119*    Increase semglee to 22U  Increase metformin to 850mg  BID  -2-15: Blood sugars well-controlled.  Continue current regimen  14: History of bicuspid aortic valve, moderate AV stenosis   15: History of CAD s/p LHC with PCI/stent on 07/12/2018             -completed DAPT for 12 months; continue statin             -follows with Dr. Allyson Sabal   16: Pancytopenia: follows with Dr. Mosetta Putt and gets B12 injection monthly             -follow-up CBC   17. Constipation-    -add senna to colace  -Having regular bowel movements  18. Confusion: d/c oxycodone---improved?  -Stable on tramadol  19, Hypotension: resolved, continue Toprol XL to 12.5mg  HS  See number-8 above  20. Fatigue: B12 ordered IM on 2/5, continue metanx  21. Polypharmacy: advised wife that we will provide her with a list of patient's medications  22. Hematoma: lovenox d/ced, continue aspirin  -2-16: Per nursing reports, passed small clot from wound site today.  23. Impaired mobility: vas [place] ordered, reviewed and is negative for clots, discussed with patient  24.  Vision deficit.  Encourage patient to bring glasses from home.  Seems worse in right eye, but responsive to blink to threat in all fields  LOS: 12 days A FACE TO FACE EVALUATION WAS PERFORMED  Angelina Sheriff 08/17/2023, 10:02 AM

## 2023-08-17 NOTE — Plan of Care (Signed)
  Problem: Consults Goal: RH LIMB LOSS PATIENT EDUCATION Description: Description: See Patient Education module for eduction specifics. Outcome: Progressing   Problem: RH BOWEL ELIMINATION Goal: RH STG MANAGE BOWEL WITH ASSISTANCE Description: STG Manage Bowel with mod I Assistance. Outcome: Adequate for Discharge Goal: RH STG MANAGE BOWEL W/MEDICATION W/ASSISTANCE Description: STG Manage Bowel with Medication with mod I Assistance. Outcome: Adequate for Discharge   Problem: RH BLADDER ELIMINATION Goal: RH STG MANAGE BLADDER WITH ASSISTANCE Description: STG Manage Bladder With mod I Assistance Outcome: Adequate for Discharge Goal: RH STG MANAGE BLADDER WITH MEDICATION WITH ASSISTANCE Description: STG Manage Bladder With Medication With mod I Assistance. Outcome: Adequate for Discharge   Problem: RH SKIN INTEGRITY Goal: RH STG SKIN FREE OF INFECTION/BREAKDOWN Description: Manage skin w min assist Outcome: Progressing   Problem: RH SAFETY Goal: RH STG ADHERE TO SAFETY PRECAUTIONS W/ASSISTANCE/DEVICE Description: STG Adhere to Safety Precautions With cues  Assistance/Device. Outcome: Progressing   Problem: RH PAIN MANAGEMENT Goal: RH STG PAIN MANAGED AT OR BELOW PT'S PAIN GOAL Description: Manage pain <4 with prns Outcome: Progressing   Problem: RH KNOWLEDGE DEFICIT LIMB LOSS Goal: RH STG INCREASE KNOWLEDGE OF SELF CARE AFTER LIMB LOSS Description: Patient and wife will be able to manage care at discharge using educational resources for medications and dietary modifications and skin care independently. Outcome: Progressing

## 2023-08-18 DIAGNOSIS — Z89512 Acquired absence of left leg below knee: Secondary | ICD-10-CM | POA: Diagnosis not present

## 2023-08-18 LAB — BASIC METABOLIC PANEL
Anion gap: 11 (ref 5–15)
BUN: 26 mg/dL — ABNORMAL HIGH (ref 8–23)
CO2: 26 mmol/L (ref 22–32)
Calcium: 9.2 mg/dL (ref 8.9–10.3)
Chloride: 99 mmol/L (ref 98–111)
Creatinine, Ser: 1 mg/dL (ref 0.61–1.24)
GFR, Estimated: >60 mL/min (ref >60)
Glucose, Bld: 123 mg/dL — ABNORMAL HIGH (ref 70–99)
Potassium: 4.1 mmol/L (ref 3.5–5.1)
Sodium: 136 mmol/L (ref 135–145)

## 2023-08-18 LAB — GLUCOSE, CAPILLARY
Glucose-Capillary: 116 mg/dL — ABNORMAL HIGH (ref 70–99)
Glucose-Capillary: 133 mg/dL — ABNORMAL HIGH (ref 70–99)
Glucose-Capillary: 80 mg/dL (ref 70–99)
Glucose-Capillary: 150 mg/dL — ABNORMAL HIGH (ref 70–99)

## 2023-08-18 LAB — CBC
HCT: 25.4 % — ABNORMAL LOW (ref 39.0–52.0)
Hemoglobin: 7.9 g/dL — ABNORMAL LOW (ref 13.0–17.0)
MCH: 26.7 pg (ref 26.0–34.0)
MCHC: 31.1 g/dL (ref 30.0–36.0)
MCV: 85.8 fL (ref 80.0–100.0)
Platelets: 216 10*3/uL (ref 150–400)
RBC: 2.96 MIL/uL — ABNORMAL LOW (ref 4.22–5.81)
RDW: 15.3 % (ref 11.5–15.5)
WBC: 4.1 10*3/uL (ref 4.0–10.5)
nRBC: 0 % (ref 0.0–0.2)

## 2023-08-18 MED ORDER — POLYETHYLENE GLYCOL 3350 17 G PO PACK
17.0000 g | PACK | Freq: Every day | ORAL | Status: DC
  Administered 2023-08-18 – 2023-08-20 (×2): 17 g via ORAL
  Filled 2023-08-18 (×5): qty 1

## 2023-08-18 NOTE — Progress Notes (Addendum)
PROGRESS NOTE   Subjective/Complaints: Continues to have clots in distal limb, pictures taken and placed in chart, ortho aware of ischemic changes, patient requests family meeting today   ROS: Patient denies fever, rash, sore throat, blurred vision, dizziness, nausea, vomiting, diarrhea, cough, shortness of breath or chest pain,  headache, or mood change.  +bleeding from residual limb incision-ongoing +muscle tightness in residual limb-ongoing + Vision changes  Objective:   No results found.  Recent Labs    08/16/23 1409 08/18/23 0540  WBC 3.4* 4.1  HGB 8.6* 7.9*  HCT 27.2* 25.4*  PLT 264 216     Recent Labs    08/16/23 1409 08/18/23 0540  NA 138 136  K 4.3 4.1  CL 102 99  CO2 27 26  GLUCOSE 126* 123*  BUN 23 26*  CREATININE 1.46* 1.00  CALCIUM 9.0 9.2      Intake/Output Summary (Last 24 hours) at 08/18/2023 1335 Last data filed at 08/18/2023 0347 Gross per 24 hour  Intake 240 ml  Output 1100 ml  Net -860 ml        Physical Exam: Vital Signs Blood pressure (!) 104/59, pulse 63, temperature 98 F (36.7 C), resp. rate 18, height 5\' 10"  (1.778 m), weight 65.4 kg, SpO2 97%. Constitutional: No distress . Vital signs reviewed.  Laying in bed. HEENT: NCAT, EOMI.  Unable to complete visual field testing throughout all fields in right eye, lateral fields and left eye.  Does blink to threat in all 4 quadrants.  Pupils constricted but equal and reactive.  Neck: supple Cardiovascular: RRR without murmur. No JVD    Respiratory/Chest: CTA Bilaterally without wheezes or rales. Normal effort    GI/Abdomen: BS +, non-tender, non-distended Ext: no clubbing, cyanosis; 2+ edema left lower extremity Psych: pleasant and cooperative  MSK: Bilateral BKA's  Skin:  2-15: Appears some increased necrotic tissue along incisional line compared to last photograph, with a few areas of bleeding/adherent to shrinker sock.  No  obvious discharge or dehiscence with palpation.  2+ edema throughout distal limb.  Ischemic changes 2/17       Neurological:     General: No focal deficit present.     Mental Status: He is alert and oriented to person, place, and time. Seems to be cognitively appropriate on a basic level--some mild delays. No focal weakness. --Consistent 2-16     Assessment/Plan: 1. Functional deficits which require 3+ hours per day of interdisciplinary therapy in a comprehensive inpatient rehab setting. Physiatrist is providing close team supervision and 24 hour management of active medical problems listed below. Physiatrist and rehab team continue to assess barriers to discharge/monitor patient progress toward functional and medical goals  Care Tool:  Bathing    Body parts bathed by patient: Right arm, Left arm, Chest, Abdomen, Front perineal area, Right upper leg, Buttocks, Left upper leg, Face   Body parts bathed by helper: Front perineal area, Buttocks, Right upper leg, Left upper leg, Face Body parts n/a: Right lower leg, Left lower leg   Bathing assist Assist Level: Set up assist     Upper Body Dressing/Undressing Upper body dressing   What is the patient wearing?: Pull over shirt  Upper body assist Assist Level: Minimal Assistance - Patient > 75%    Lower Body Dressing/Undressing Lower body dressing      What is the patient wearing?: Incontinence brief, Pants     Lower body assist Assist for lower body dressing: Moderate Assistance - Patient 50 - 74%     Toileting Toileting    Toileting assist Assist for toileting: Maximal Assistance - Patient 25 - 49%     Transfers Chair/bed transfer  Transfers assist  Chair/bed transfer activity did not occur: Safety/medical concerns (unsafet to get up)  Chair/bed transfer assist level: Supervision/Verbal cueing (lateral scoot)     Locomotion Ambulation   Ambulation assist   Ambulation activity did not occur:  Safety/medical concerns (pain, decreased balance on R prosthetic)  Assist level: 2 helpers Assistive device: Walker-rolling Max distance: 29ft   Walk 10 feet activity   Assist  Walk 10 feet activity did not occur: Safety/medical concerns (pain, decreased balance on R prosthetic)  Assist level: 2 helpers Assistive device: Walker-rolling   Walk 50 feet activity   Assist Walk 50 feet with 2 turns activity did not occur: Safety/medical concerns (pain, decreased balance on R prosthetic)         Walk 150 feet activity   Assist Walk 150 feet activity did not occur: Safety/medical concerns (pain, decreased balance on R prosthetic)         Walk 10 feet on uneven surface  activity   Assist Walk 10 feet on uneven surfaces activity did not occur: Safety/medical concerns (pain, decreased balance on R prosthetic)         Wheelchair     Assist Is the patient using a wheelchair?: Yes Type of Wheelchair:  Armed forces technical officer)    Wheelchair assist level: Supervision/Verbal cueing Max wheelchair distance: >325ft    Wheelchair 50 feet with 2 turns activity    Assist        Assist Level: Supervision/Verbal cueing   Wheelchair 150 feet activity     Assist      Assist Level: Supervision/Verbal cueing   Blood pressure (!) 104/59, pulse 63, temperature 98 F (36.7 C), resp. rate 18, height 5\' 10"  (1.778 m), weight 65.4 kg, SpO2 97%.  Medical Problem List and Plan: 1. 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             -ELOS/Goals: 10-12 days at w/c level  IPOC completed   Continue CIR therapies including PT, OT   Would benefit from hospital bed for safety  2. Bleeding from residual limb incision: resolved, lovenox d/ced, continue aspirin   3. Pain Management: Tylenol, oxycodone d/ced due to codeine allergy, tramadol ordered prn   -2-15: Using  Tramadol and Advil consistently; refused Robaxin overnight, is having ongoing hypotension anyway so we will DC  2-16: BPs improved, but 8 out of 10 pain overnight.  Increase tramadol to 50 to 100 mg as needed  4. Hallucinations: discussed with wife that these are a side effect to codeine, oxycodone d/ced    5. Neuropsych/cognition: currently, patient is not  capable of making decisions on his  own behalf. Confused, but hasn't received pain meds today.    -2-16: Some ongoing mild confusion, but no gross deficits on exam today  6. Skin/Wound Care: Routine skin care checks   -2-15: Some skin  breakdown and bleeding along incisional site with adherence to shrinker sock; changed wound care orders to petroleum infused gauze along incision line, reinforced with dry kerlex and tape   7. Fluids/Electrolytes/Nutrition: Routine Is and Os and follow-up chemistries             -continue vit C, zinc supplements   8: Hypertension: monitor TID and prn             -increase toprol XL to 25mg  HS             -on multiple bladder meds  - 2/15: Ongoing hypotension.  Heart rate well-controlled 60s to 70s.  Toprol was already reduced to 12.5 mg nightly on Thursday.  Placed hold parameters for SBP less than 90, DBP less than 50.  2-16: BPs improved   9: Hyperlipidemia: continue statin   10: ? OAB/? 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 Lajoyce Corners  -new shrinker was placed, continue  -2-15: Area of skin breakdown does seem slightly increased from admission, however no concerning findings of dehiscence or infection.  Photos placed in chart.  Dressings changed as above.  Will reexamine in a.m.  2/16: D/w Vascular Lianne Cure, PA, will see today or tomorrow for wound monitoring given skin breakdown and continued bleeding   13: DM: CBGs QID, A1c = 10%             -continue moderate scale SSI             -continue Novolog 4 units with meals              -increase metformin to 1,000mg  BID  -provided list of foods for diabetes   CBG (last 3)  Recent Labs    08/17/23 2050 08/18/23 0617 08/18/23 1127  GLUCAP 134* 116* 133*    Increase semglee to 22U  Increase metformin to 850mg  BID  -2-15: Blood sugars well-controlled.  Continue current regimen  14: History of bicuspid aortic valve, moderate AV stenosis   15: History of CAD s/p LHC with PCI/stent on 07/12/2018             -completed DAPT for 12 months; continue statin             -follows with Dr. Allyson Sabal   16: Pancytopenia: follows with Dr. Mosetta Putt and gets B12 injection monthly             -follow-up CBC   17. Constipation-    -add senna to colace, schedule miralax  -Having regular bowel movements  18. Confusion: resolved with cessation of oxycodone  -Stable on tramadol  19, Hypotension: d/c toprol-XL  20. Fatigue: B12 ordered IM on 2/5, continue metanx  21. Polypharmacy: advised wife that we will provide her with a list of patient's medications  22. Hematoma: lovenox d/ced, continue aspirin, discussed with ortho and team ischemic changes to lim  23. Impaired mobility: vas [place] ordered, reviewed and is negative for clots, discussed with patient  24.  Vision deficit.  Encourage patient to bring glasses from home.  Seems worse in right eye, but responsive to blink to threat in all fields  LOS: 13 days A FACE TO FACE EVALUATION WAS PERFORMED  Clint Bolder P Karry Causer 08/18/2023, 1:35 PM

## 2023-08-18 NOTE — Progress Notes (Signed)
Occupational Therapy Session Note  Patient Details  Name: Adam Ray MRN: [id number] Date of Birth: 07-15-1955  Today's Date: 08/18/2023 OT Individual Time: 1305-1330 OT Individual Time Calculation (min): 25 min    Short Term Goals: Week 2:  OT Short Term Goal 1 (Week 2): STG = LTG due to ELOS  Skilled Therapeutic Interventions/Progress Updates:  Skilled OT intervention completed with focus on family education with pt's wife present. Pt received upright in bed, agreeable to session. No pain reported.  Pt's wife was under the impression that pt's medical team including surgeon, was planning to meet at 2 PM for a confirmed meeting, and was very upset about OT arrival for scheduled therapy. Wife was perseverative on pt's "lack of care" by the medical team as "his leg is too swollen" and therapy staff has only taught him to "scoot on his butt." OT attempted to provide education on the multiple team members following pt's care, with visit by the surgeon this morning who indicated that no intervention needed for pt's L BKA other than wound care and incision checks, however pt's wife was not very receptive. Relayed all of the wife's concerns and requests to CSW.  Pt's wife stated she doesn't know how pt will transfer to his scooter therefore OT utilized rest of session to demo transfer with pt. Pt transitioned to EOB with mod I. Donned LLE limb guard independently. Wife brought scooter around to L side of bed with education provided on achieving proximity and as level of surface as possible. Supervision lateral scoot > scooter, then set up A to donn RLE prosthesis. Pt remained seated on scooter with wife present, with all needs in reach at end of session.   Therapy Documentation Precautions:  Precautions Precautions: Fall Required Braces or Orthoses: Other Brace Other Brace: LLE Limb Guard Restrictions Weight Bearing Restrictions Per Provider Order: Yes LLE Weight Bearing Per Provider Order:  Non weight bearing    Therapy/Group: Individual Therapy  Melvyn Novas, MS, OTR/L  08/18/2023, 3:27 PM

## 2023-08-18 NOTE — Progress Notes (Signed)
Occupational Therapy Session Note  Patient Details  Name: Adam Ray MRN: [id number] Date of Birth: Mar 02, 1956  Today's Date: 08/18/2023 OT Individual Time: 1914-7829 OT Individual Time Calculation (min): 44 min  and Today's Date: 08/18/2023 OT Missed Time: 15 Minutes Missed Time Reason: Other (comment) (SW/MD education with spouse)   Short Term Goals: Week 2:  OT Short Term Goal 1 (Week 2): STG = LTG due to ELOS  Skilled Therapeutic Interventions/Progress Updates:   Pt greeted sitting in scooter, skilled OT session with focus on caregiver education. Pt presents rather drowsy, assisted with making phone call to [facility] for continuation of care. Spouse assisted with connection to [employer] for improved health literacy/awareness of patient care. Spouse present/observant during lateral/scoot transfers from scooter<>EOB (x2), emphasis placed on turning off scooter prior to movement alongside position of R prosthetic. OT educating on need for limb-guard OOB for skin/incision integrity. Residual limb positioning at bed-level also reviewed. Questioning cuing used throughout with successful recall from patient  Pt remained on phone with MD for further medical education, also having spoken with SW at beginning of session  (missing ~15 mins) .4Ps assessed and immediate needs met. Pt continues to be appropriate for skilled OT intervention to promote further functional independence in ADLs/IADLs.   Therapy Documentation Precautions:  Precautions Precautions: Fall Precaution/Restrictions Comments: LLE Wound Vac Required Braces or Orthoses: Other Brace Other Brace: LLE Limb Guard Restrictions Weight Bearing Restrictions Per Provider Order: Yes LLE Weight Bearing Per Provider Order: Non weight bearing   Therapy/Group: Individual Therapy  Lou Cal, OTR/L, MSOT  08/18/2023, 6:32 AM

## 2023-08-18 NOTE — Progress Notes (Signed)
Physical Therapy Session Note  Patient Details  Name: Adam Ray MRN: [id number] Date of Birth: 01/29/1956  Today's Date: 08/18/2023 PT Individual Time: 0730-0813 PT Individual Time Calculation (min): 43 min   Short Term Goals: Week 1:  PT Short Term Goal 1 (Week 1): pt will perform bed mobility with min A PT Short Term Goal 1 - Progress (Week 1): Met PT Short Term Goal 2 (Week 1): pt will transfer bed<>chair with LRAD and mod A PT Short Term Goal 2 - Progress (Week 1): Met PT Short Term Goal 3 (Week 1): pt will transfer sit<>stand with LRAD and mod A PT Short Term Goal 3 - Progress (Week 1): Met Week 2:  PT Short Term Goal 1 (Week 2): STG=LTG due to LOS  Skilled Therapeutic Interventions/Progress Updates:   Received pt semi-reclined in bed, pt agreeable to PT treatment, and reported pain 7/10 in L residual limb - RN notified. Pt requesting meeting with PT, OT, MD, CSW, and Dr. Lajoyce Corners today at 2pm with his wife to discuss healing of his limb - treatment team notified. Session with emphasis on functional mobility/transfers and generalized strengthening and endurance. Attempted to don limb guard, but found larger than quarter sized blood clot sitting inside. Care coordinator notified and present to collect clot. Cleaned limb guard and donned limb guard with supervision - cues to place black strap over knee to promote extension. and pt transferred supine<>sitting L EOB from flat bed with mod I and increased time. Donned gel liner and prosthetic with set up assist and transferred into [facility] via lateral scoot with close supervision. Worked on Engineer, maintenance with min A overall due to visual impairments. Concluded session with pt sitting in WC, needs within reach, and seatbelt alarm on. RN at bedside attending to care.   Therapy Documentation Precautions:  Precautions Precautions: Fall Precaution/Restrictions Comments: LLE Wound Vac Required Braces or Orthoses: Other Brace Other Brace: LLE Limb  Guard Restrictions Weight Bearing Restrictions Per Provider Order: Yes LLE Weight Bearing Per Provider Order: Non weight bearing  Therapy/Group: Individual Therapy Marlana Salvage Zaunegger Blima Rich PT, DPT 08/18/2023, 7:15 AM

## 2023-08-18 NOTE — Progress Notes (Signed)
Physical Therapy Session Note  Patient Details  Name: Adam Ray MRN: [id number] Date of Birth: 14-Jun-1956  Today's Date: 08/18/2023 PT Individual Time: 4098-1191 PT Individual Time Calculation (min): 60 min   Short Term Goals: Week 2:  PT Short Term Goal 1 (Week 2): STG=LTG due to LOS  Skilled Therapeutic Interventions/Progress Updates:      Pt sitting in wheelchair - has prosthesis and limb guard on. Reports unrated RLE discomfort, mobility and repositioning provided for pain management.   Pt propels himself at supervision level using manual wheelchair from his room to day room rehab gym ~158ft - tends to drift to the R and has poor B shoulder extension during propulsion resulting in poor efficiency and slow speed.   Gait training 2x35ft with min/modA and RW - cues for shifting balance and COG more anteriorly as he has posterior tendencies. Patient needs modA for safely turning to sit - has difficulty with RW management and hopping to turn, especially as he fatigues.   At [facility], worked on core strengthening and BUE strengthening: -2x20 sit ups -2x20 trunk twists with 7# dumbbell -1x10 "steering wheels" with 3# dumbbell -1x15 chest press with 3# dumbbell  Pt returned to his room and ended session sitting upright in wheelchair. Pt requesting to return to bed but linen dirty with dried blood - NT made aware of linen change before returning to bed. Left in wheelchair with all needs met and seat belt alarm on.   Therapy Documentation Precautions:  Precautions Precautions: Fall Precaution/Restrictions Comments: LLE Wound Vac Required Braces or Orthoses: Other Brace Other Brace: LLE Limb Guard Restrictions Weight Bearing Restrictions Per Provider Order: Yes LLE Weight Bearing Per Provider Order: Non weight bearing General:    Therapy/Group: Individual Therapy  Orrin Brigham 08/18/2023, 7:57 AM

## 2023-08-18 NOTE — Progress Notes (Addendum)
Patient ID: Adam Ray, male   DOB: 09-04-1955, 68 y.o.   MRN: [id number]  SW received updates from therapy reporting pt wife was upset about a meeting being held at 2pm. SW went to room and met with pt and pt wife. Through long discussion, pt reports he put ina request to nursing for a meeting today and informed his wife that a meeting was being held. SW explained that a meeting has to be scheduled and it had not been. Pt wife realizes she was misinformed. She would like to retrieve his medical records. SW provided contact information for medical records. CM reports she will inform wife on how to use mychart. Pt wife reports pt is getting a hospital bed. SW shared there has been no discussion of a hospital bed needed. SW shared will need to confirm with medical team if this is needed. SW encouraged pt wife to contact SW if any questions/concerns in relation to his discharge needs.     SW waiting on confirmation from medical team if hospital bed is needed. *MD approved.    Cecile Sheerer, MSW, LCSW Office: [phone] Cell: [phone] Fax: [phone]

## 2023-08-19 ENCOUNTER — Telehealth: Payer: Self-pay | Admitting: Orthopedic Surgery

## 2023-08-19 DIAGNOSIS — Z89512 Acquired absence of left leg below knee: Secondary | ICD-10-CM | POA: Diagnosis not present

## 2023-08-19 LAB — CBC WITH DIFFERENTIAL/PLATELET
Abs Immature Granulocytes: 0.01 10*3/uL (ref 0.00–0.07)
Basophils Absolute: 0.1 10*3/uL (ref 0.0–0.1)
Basophils Relative: 1 %
Eosinophils Absolute: 0.3 10*3/uL (ref 0.0–0.5)
Eosinophils Relative: 8 %
HCT: 27.9 % — ABNORMAL LOW (ref 39.0–52.0)
Hemoglobin: 8.6 g/dL — ABNORMAL LOW (ref 13.0–17.0)
Immature Granulocytes: 0 %
Lymphocytes Relative: 31 %
Lymphs Abs: 1.3 10*3/uL (ref 0.7–4.0)
MCH: 26.6 pg (ref 26.0–34.0)
MCHC: 30.8 g/dL (ref 30.0–36.0)
MCV: 86.4 fL (ref 80.0–100.0)
Monocytes Absolute: 0.5 10*3/uL (ref 0.1–1.0)
Monocytes Relative: 13 %
Neutro Abs: 1.9 10*3/uL (ref 1.7–7.7)
Neutrophils Relative %: 47 %
Platelets: 223 10*3/uL (ref 150–400)
RBC: 3.23 MIL/uL — ABNORMAL LOW (ref 4.22–5.81)
RDW: 15.6 % — ABNORMAL HIGH (ref 11.5–15.5)
WBC: 4.2 10*3/uL (ref 4.0–10.5)
nRBC: 0 % (ref 0.0–0.2)

## 2023-08-19 LAB — GLUCOSE, CAPILLARY
Glucose-Capillary: 82 mg/dL (ref 70–99)
Glucose-Capillary: 78 mg/dL (ref 70–99)
Glucose-Capillary: 162 mg/dL — ABNORMAL HIGH (ref 70–99)
Glucose-Capillary: 126 mg/dL — ABNORMAL HIGH (ref 70–99)

## 2023-08-19 MED ORDER — TRAMADOL HCL 50 MG PO TABS
50.0000 mg | ORAL_TABLET | Freq: Four times a day (QID) | ORAL | Status: DC | PRN
  Administered 2023-08-19 (×2): 100 mg via ORAL
  Administered 2023-08-21: 50 mg via ORAL
  Administered 2023-08-21 – 2023-08-23 (×5): 100 mg via ORAL
  Filled 2023-08-19 (×8): qty 2

## 2023-08-19 MED ORDER — INSULIN ASPART 100 UNIT/ML IJ SOLN
3.0000 [IU] | Freq: Three times a day (TID) | INTRAMUSCULAR | Status: DC
Start: 2023-08-19 — End: 2023-08-19

## 2023-08-19 MED ORDER — CYCLOBENZAPRINE HCL 5 MG PO TABS
5.0000 mg | ORAL_TABLET | Freq: Three times a day (TID) | ORAL | Status: DC | PRN
  Administered 2023-08-19 – 2023-08-21 (×3): 5 mg via ORAL
  Filled 2023-08-19 (×4): qty 1

## 2023-08-19 NOTE — Telephone Encounter (Signed)
Patient called requesting for a call back please. [phone]

## 2023-08-19 NOTE — Progress Notes (Signed)
Occupational Therapy Discharge Summary  Patient Details  Name: BRODIE CORRELL MRN: [id number] Date of Birth: Nov 13, 1955  Date of Discharge from OT service:{MONTH:10108} {NUMBERS 1-31 (DATE):31396}, {YEAR HISTORY:31397}   Patient has met 10 of 11 long term goals due to improved activity tolerance, improved balance, postural control, ability to compensate for deficits, and improved coordination.  Patient to discharge at [facility] Assist level.  Patient's care partner is independent to provide the necessary physical and cognitive assistance at discharge. Pt's wife completed hands on training/education prior to DC regarding functional transfer and ADL recommendations.   Reasons goals not met: Pt did not meet sit > stand goal of CGA, due to increased assist needed for global deconditioning, dynamic standing balance and GM coordination deficits  Recommendation:  Patient will benefit from ongoing skilled OT services in home health setting to continue to advance functional skills in the area of BADL and Reduce care partner burden.  Equipment: Bariatric drop arm BSC  Reasons for discharge: treatment goals met and discharge from hospital  Patient/family agrees with progress made and goals achieved: Yes  OT Discharge Precautions/Restrictions  Precautions Precautions: Fall Required Braces or Orthoses: Other Brace Other Brace: LLE Limb Guard Restrictions Weight Bearing Restrictions Per Provider Order: Yes LLE Weight Bearing Per Provider Order: Non weight bearing ADL ADL Eating: Set up Where Assessed-Eating: Wheelchair Grooming: Setup Where Assessed-Grooming: Sitting at sink Upper Body Bathing: Setup Where Assessed-Upper Body Bathing: Shower Lower Body Bathing: Setup Where Assessed-Lower Body Bathing: Shower Upper Body Dressing: Setup Where Assessed-Upper Body Dressing: Wheelchair Lower Body Dressing: Setup Where Assessed-Lower Body Dressing: Bed level Toileting: Minimal  assistance Where Assessed-Toileting: Bedside Commode Toilet Transfer: Close supervision Toilet Transfer Method: Other (comment) (lateral scoot) Acupuncturist: Extra wide drop arm bedside commode Tub/Shower Transfer: Contact guard Tub/Shower Transfer Method: Squat pivot (lateral scoot) Tub/Shower Equipment: Insurance underwriter: Administrator, arts Method: Other (comment) (lateral scoot) Astronomer: Emergency planning/management officer, Grab bars Vision Baseline Vision/History: 1 Wears glasses Patient Visual Report: Blurring of vision Vision Assessment?: Vision impaired- to be further tested in functional context Additional Comments: Pt reports being unable to see details or words, but can see figures/outlines and objects functionally, reporting this has changed since surgery Perception  Perception: Impaired Praxis Praxis: WFL Cognition Cognition Overall Cognitive Status: Within Functional Limits for tasks assessed Arousal/Alertness: Awake/alert Memory: Appears intact Awareness: Impaired Problem Solving: Impaired Safety/Judgment: Impaired Comments: decreased insight into deficits Sensation Sensation Light Touch: Impaired Detail Hot/Cold: Not tested Proprioception: Impaired by gross assessment Stereognosis: Not tested Additional Comments: getting L and R confused; absent sensation along distal aspect of R residual limb. 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, global weakness/deconditioning,  decreased standing balance/coordination, and pain Mobility  Bed Mobility Bed Mobility: Rolling Right;Rolling Left;Sit to Supine;Supine to Sit Rolling Right: Independent with assistive device Rolling Left: Independent with assistive device Supine to Sit: Independent with assistive device Sit to Supine: Independent with assistive device Transfers Sit to Stand: Minimal Assistance - Patient > 75% Stand to Sit: Supervision/Verbal cueing  Trunk/Postural Assessment  Cervical Assessment Cervical Assessment: Exceptions to [facility] (forward head) Thoracic Assessment Thoracic  Assessment: Exceptions to [facility] (rounded shoulders) Lumbar Assessment Lumbar Assessment: Exceptions to [facility] (posterior pelvic tilt) Postural Control Postural Control: Deficits on evaluation Trunk Control: posterior lean in sitting but able to self correct Protective Responses: delayed and inadequate - improved since eval  Balance Balance Balance Assessed: Yes Static Sitting Balance Static Sitting - Balance Support: Feet supported;Bilateral upper extremity supported Static Sitting - Level of Assistance: 7: Independent Dynamic Sitting Balance Dynamic Sitting - Balance Support: No upper extremity supported;Feet supported Dynamic Sitting - Level of Assistance: 6: Modified independent (Device/Increase time) Static Standing Balance Static Standing - Balance Support: Bilateral upper extremity supported;During functional activity (RW) Static Standing - Level of Assistance: 5: Stand by assistance (CGA) Dynamic Standing Balance Dynamic Standing - Balance Support: Bilateral upper extremity supported;During functional activity (RW) Dynamic Standing - Level of Assistance: 4: Min assist Dynamic Standing - Comments: gait Extremity/Trunk Assessment RUE Assessment RUE Assessment: Within Functional Limits Active Range of Motion (AROM) Comments: Limited to 100 degrees shoulder flexion, WFL distally General Strength Comments: 4 LUE  Assessment LUE Assessment: Within Functional Limits   Maxwell Martorano E Meeka Cartelli, MS, OTR/L  08/19/2023, 12:25 PM

## 2023-08-19 NOTE — Progress Notes (Shared)
Physical Therapy Discharge Summary  Patient Details  Name: Adam Ray MRN: [id number] Date of Birth: [birth year]  Date of Discharge from PT service:August 19, 2023  Patient has met 8 of 9 long term goals due to improved activity tolerance, improved balance, improved postural control, increased strength, increased range of motion, decreased pain, ability to compensate for deficits, improved awareness, and improved coordination. Patient to discharge at a wheelchair level Supervision. Patient's care partner is independent to provide the necessary physical assistance at discharge. Pt's wife attended family education training and verbalized and demonstrated confidence with tasks to ensure safe discharge home.   Reasons goals not met: pt did not meet sit<>stand goal of CGA as pt currently requires min A to stand due to global weakness/deconditioning  Recommendation:  Patient will benefit from ongoing skilled PT services in home health setting to continue to advance safe functional mobility, address ongoing impairments in transfers, generalized strengthening and endurance, dynamic standing balance/coordination, limb care, gait training, and tominimize fall risk.  Equipment: No equipment provided - pt already has electric WC at home and plans on getting lightweight WC from [place] upon discharge  Reasons for discharge: treatment goals met and discharge from hospital  Patient/family agrees with progress made and goals achieved: Yes  PT Discharge Precautions/Restrictions Precautions Precautions: Fall Required Braces or Orthoses: Other Brace Other Brace: LLE Limb Guard Restrictions Weight Bearing Restrictions Per Provider Order: Yes LLE Weight Bearing Per Provider Order: Non weight bearing Pain Interference Pain Interference Pain Effect on Sleep: 2. Occasionally Pain Interference with Therapy Activities: 0. Does not apply - I have not received rehabilitationtherapy in the past 5 days Pain  Interference with Day-to-Day Activities: 1. Rarely or not at all Cognition Overall Cognitive Status: Within Functional Limits for tasks assessed Arousal/Alertness: Awake/alert Orientation Level: Oriented X4 Memory: Appears intact Awareness: Impaired Problem Solving: Impaired Safety/Judgment: Impaired Comments: decreased insight into deficits Sensation Sensation Light Touch: Impaired Detail Hot/Cold: Not tested Proprioception: Impaired by gross assessment Stereognosis: Not tested Additional Comments: getting L and R confused; absent sensation along distal aspect of R residual limb. 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, global weakness/deconditioning, decreased standing balance/coordination, and pain  Mobility Bed Mobility Bed Mobility: Rolling Right;Rolling Left;Sit to Supine;Supine to Sit Rolling Right: Independent with assistive device Rolling Left: Independent with assistive device Supine to Sit: Independent with assistive device Sit to Supine: Independent with assistive device Transfers Transfers: Sit to Stand;Stand to Sit;Lateral/Scoot Transfers Sit to Stand: Minimal Assistance - Patient > 75% Stand to Sit: Supervision/Verbal cueing Lateral/Scoot Transfers: Supervision/Verbal cueing Transfer (Assistive device): Rolling walker Locomotion  Gait Ambulation: Yes Gait Assistance: Minimal Assistance - Patient > 75% Gait Distance (Feet): 20 Feet Assistive device: Rolling walker Gait Assistance Details: Verbal cues for gait  pattern;Verbal cues for sequencing;Verbal cues for technique;Verbal cues for safe use of DME/AE Gait Assistance Details: verbal cues for appropriate step length and sequencing with RW Gait Gait: Yes Gait Pattern: Impaired ("hop to") Gait velocity: decreased Stairs / Additional Locomotion Stairs: No Pick up small object from the floor assist level: Dependent - Patient 0% Wheelchair Mobility Wheelchair Mobility: Yes Wheelchair Assistance: Doctor, general practice: Both upper  extremities Wheelchair Parts Management: Needs assistance Distance: 117ft  Trunk/Postural Assessment  Cervical Assessment Cervical Assessment: Exceptions to [facility] (forward head) Thoracic Assessment Thoracic Assessment: Exceptions to [facility] (rounded shoulders) Lumbar Assessment Lumbar Assessment: Exceptions to [facility] (posterior pelvic tilt) Postural Control Postural Control: Deficits on evaluation Trunk Control: posterior lean in sitting but able to self correct Protective Responses: delayed and inadequate - improved since eval  Balance Balance Balance Assessed: Yes Static Sitting Balance Static Sitting - Balance Support: Feet supported;Bilateral upper extremity supported Static Sitting - Level of Assistance: 7: Independent Dynamic Sitting Balance Dynamic Sitting - Balance Support: No upper extremity supported;Feet supported Dynamic Sitting - Level of Assistance: 6: Modified independent (Device/Increase time) Static Standing Balance Static Standing - Balance Support: Bilateral upper extremity supported;During functional activity (RW) Static Standing - Level of Assistance: 5: Stand by assistance (CGA) Dynamic Standing Balance Dynamic Standing - Balance Support: Bilateral upper extremity supported;During functional activity (RW) Dynamic Standing - Level of Assistance: 4: Min assist Dynamic Standing - Comments: gait Extremity Assessment  RLE Assessment RLE Assessment: Not tested General  Strength Comments: grossly 3+/5 LLE Assessment LLE Assessment: Not tested General Strength Comments: grossly 3+/5  Marlana Salvage Zaunegger Blima Rich PT, DPT 08/19/2023, 8:45 AM

## 2023-08-19 NOTE — Progress Notes (Signed)
Physical Therapy Session Note  Patient Details  Name: Adam Ray MRN: [id number] Date of Birth: [birth year]  Today's Date: 08/19/2023 PT Individual Time: 8295-6213 PT Individual Time Calculation (min): 55 min   Short Term Goals: Week 1:  PT Short Term Goal 1 (Week 1): pt will perform bed mobility with min A PT Short Term Goal 1 - Progress (Week 1): Met PT Short Term Goal 2 (Week 1): pt will transfer bed<>chair with LRAD and mod A PT Short Term Goal 2 - Progress (Week 1): Met PT Short Term Goal 3 (Week 1): pt will transfer sit<>stand with LRAD and mod A PT Short Term Goal 3 - Progress (Week 1): Met Week 2:  PT Short Term Goal 1 (Week 2): STG=LTG due to LOS  Skilled Therapeutic Interventions/Progress Updates:   Received pt semi-reclined in bed, pt agreeable to PT treatment, and denied any pain during session. Session with emphasis on discharge planning, functional mobility/transfers, generalized strengthening and endurance, and limb care. Went through pain interference questionnaire, sensation, and MMT in preparation for discharge. Removed shrinker, ace wrap, kirlex, and xeroform and inspected incision. Blood weeping through all dressings and required use of saline to loosen - MD arrived to inspect incision. Noted drainage on L lateral aspect of incision with areas of necrosis/ischemic changes in addition to pink healthy tissue. Discussed with treatment team possibility of getting home health nurse to come out 1-2x/week as well as arranging transportation home tomorrow. Redressed limb with xeroform, kirlex, ace wrap, and shrinker per Dr. Audrie Lia orders and encouraged pt to get wife to come in to practice with dressing changes prior to discharge. Donned limb guard with supervision and increased time and pt transferred supine<>sitting R EOB from flat bed without rails mod I. Donned gel liner and prosthetic with set up assist, then performed lateral scoot into WC with supervision. Concluded session with  pt sitting in [facility], needs within reach, and seatbelt alarm on speaking with wife on phone.   Therapy Documentation Precautions:  Precautions Precautions: Fall Precaution/Restrictions Comments: LLE Wound Vac Required Braces or Orthoses: Other Brace Other Brace: LLE Limb Guard Restrictions Weight Bearing Restrictions Per Provider Order: Yes LLE Weight Bearing Per Provider Order: Non weight bearing  Therapy/Group: Individual Therapy Marlana Salvage Zaunegger Blima Rich PT, DPT 08/19/2023, 6:53 AM

## 2023-08-19 NOTE — Progress Notes (Signed)
Patient ID: BALEN WOOLUM, male   DOB: 1956/05/14, 68 y.o.   MRN: [id number]  Per therapy team, asked if [facility] can be added for wound.   SW scheduled transportation with [employer]; SW spoke with Alona Bene and requested pick up at 1:30pm. Nursing station number was provided for follow-up when en route for pick up.   SW sent clinicals and order for hospital bed to [facility] SW Cassie.   Cecile Sheerer, MSW, LCSW Office: [phone] Cell: [phone] Fax: [phone]

## 2023-08-19 NOTE — Progress Notes (Signed)
Physical Therapy Session Note  Patient Details  Name: Adam Ray MRN: [id number] Date of Birth: 01-Jun-1956  Today's Date: 08/19/2023 PT Individual Time: 1300-1320 PT Individual Time Calculation (min): 20 min  and Today's Date: 08/19/2023 PT Missed Time: 40 Minutes Missed Time Reason: Patient fatigue;Pain  Short Term Goals: Week 2:  PT Short Term Goal 1 (Week 2): STG=LTG due to LOS  Skilled Therapeutic Interventions/Progress Updates:      Pt sitting upright in wheelchair - has L residual limb wrapped with ace wrap and ice compression as well. Patient appearing anxious and uncomfortable, calling out "nurse!" For help. Offered assistance but patient requesting the nurse only to help with reducing the compression from wrap. RN arriving and loosened the ace wrap and removed the ice. Patient adamant on not participating in therapy this PM - says he is in too much pain. Assisted back to bed with CGA lateral scoot transfer from w/c to bed. Able to doff his prosthesis without assist and able to reposition himself in bed without assistance. SetupA for his meal. Pt apologizing for inability to participate in therapy session. He missed 50 minutes of therapy due to pain.   Therapy Documentation Precautions:  Precautions Precautions: Fall Precaution/Restrictions Comments: LLE Wound Vac Required Braces or Orthoses: Other Brace Other Brace: LLE Limb Guard Restrictions Weight Bearing Restrictions Per Provider Order: Yes LLE Weight Bearing Per Provider Order: Non weight bearing General:    Therapy/Group: Individual Therapy  Orrin Brigham 08/19/2023, 7:53 AM

## 2023-08-19 NOTE — Telephone Encounter (Signed)
I called and sw pt and he said that he wants Dr. Lajoyce Corners to say that he is not ready to d/c home. I advised the pt that this is up to PT and if he has met his goals in CIR and if they feel he is ready to d/c home. From a surgical stand point Dr. Lajoyce Corners can sign off on if his surgical site is well enough for him to leave but the decision is not up to him. Advised for him to address his concerns with case management and PT. Pt voiced understanding and will call with any questions.

## 2023-08-19 NOTE — Progress Notes (Signed)
PROGRESS NOTE   Subjective/Complaints: He continues to have bleeding from residual limb, does not feel ready for d/c, discussed with Dr. Lajoyce Corners and no further medical interventions warranted at this time   ROS: Patient denies fever, rash, sore throat, blurred vision, dizziness, nausea, vomiting, diarrhea, cough, shortness of breath or chest pain,  headache, or mood change.  +bleeding from residual limb incision-ongoing +muscle tightness in residual limb-ongoing + Vision changes  Objective:   No results found.  Recent Labs    08/18/23 0540 08/19/23 0526  WBC 4.1 4.2  HGB 7.9* 8.6*  HCT 25.4* 27.9*  PLT 216 223     Recent Labs    08/16/23 1409 08/18/23 0540  NA 138 136  K 4.3 4.1  CL 102 99  CO2 27 26  GLUCOSE 126* 123*  BUN 23 26*  CREATININE 1.46* 1.00  CALCIUM 9.0 9.2      Intake/Output Summary (Last 24 hours) at 08/19/2023 1333 Last data filed at 08/19/2023 0900 Gross per 24 hour  Intake 240 ml  Output 1375 ml  Net -1135 ml        Physical Exam: Vital Signs Blood pressure 118/68, pulse 94, temperature 98.1 F (36.7 C), resp. rate 18, height 5\' 10"  (1.778 m), weight 65.4 kg, SpO2 97%. Constitutional: No distress . Vital signs reviewed.  Laying in bed. HEENT: NCAT, EOMI.  Unable to complete visual field testing throughout all fields in right eye, lateral fields and left eye.  Does blink to threat in all 4 quadrants.  Pupils constricted but equal and reactive.  Neck: supple Cardiovascular: RRR without murmur. No JVD    Respiratory/Chest: CTA Bilaterally without wheezes or rales. Normal effort    GI/Abdomen: BS +, non-tender, non-distended Ext: no clubbing, cyanosis; 2+ edema left lower extremity Psych: pleasant and cooperative  MSK: Bilateral BKA's  Skin:  2-15: Appears some increased necrotic tissue along incisional line compared to last photograph, with a few areas of bleeding/adherent to  shrinker sock.  No obvious discharge or dehiscence with palpation.  2+ edema throughout distal limb.  Ischemic changes 2/18 with bleeding       Neurological:     General: No focal deficit present.     Mental Status: He is alert and oriented to person, place, and time. Seems to be cognitively appropriate on a basic level--some mild delays. No focal weakness. --Consistent 2-16     Assessment/Plan: 1. Functional deficits which require 3+ hours per day of interdisciplinary therapy in a comprehensive inpatient rehab setting. Physiatrist is providing close team supervision and 24 hour management of active medical problems listed below. Physiatrist and rehab team continue to assess barriers to discharge/monitor patient progress toward functional and medical goals  Care Tool:  Bathing    Body parts bathed by patient: Right arm, Left arm, Chest, Abdomen, Front perineal area, Right upper leg, Buttocks, Left upper leg, Face   Body parts bathed by helper: Front perineal area, Buttocks, Right upper leg, Left upper leg, Face Body parts n/a: Right lower leg, Left lower leg   Bathing assist Assist Level: Set up assist     Upper Body Dressing/Undressing Upper body dressing   What is the  patient wearing?: Pull over shirt    Upper body assist Assist Level: Set up assist    Lower Body Dressing/Undressing Lower body dressing      What is the patient wearing?: Incontinence brief, Pants     Lower body assist Assist for lower body dressing: Minimal Assistance - Patient > 75%     Toileting Toileting    Toileting assist Assist for toileting: Minimal Assistance - Patient > 75%     Transfers Chair/bed transfer  Transfers assist  Chair/bed transfer activity did not occur: Safety/medical concerns (unsafet to get up)  Chair/bed transfer assist level: Supervision/Verbal cueing (lateral scoot)     Locomotion Ambulation   Ambulation assist   Ambulation activity did not occur:  Safety/medical concerns (pain, decreased balance on R prosthetic)  Assist level: Minimal Assistance - Patient > 75% Assistive device: Walker-rolling Max distance: 2ft   Walk 10 feet activity   Assist  Walk 10 feet activity did not occur: Safety/medical concerns (pain, decreased balance on R prosthetic)  Assist level: Minimal Assistance - Patient > 75% Assistive device: Walker-rolling   Walk 50 feet activity   Assist Walk 50 feet with 2 turns activity did not occur: Safety/medical concerns (fatigue, decreased balance/coordination)         Walk 150 feet activity   Assist Walk 150 feet activity did not occur: Safety/medical concerns (fatigue, decreased balance/coordination)         Walk 10 feet on uneven surface  activity   Assist Walk 10 feet on uneven surfaces activity did not occur: Safety/medical concerns (fatigue, decreased balance)         Wheelchair     Assist Is the patient using a wheelchair?: Yes Type of Wheelchair:  Armed forces technical officer)    Wheelchair assist level: Supervision/Verbal cueing Max wheelchair distance: >336ft    Wheelchair 50 feet with 2 turns activity    Assist        Assist Level: Supervision/Verbal cueing   Wheelchair 150 feet activity     Assist      Assist Level: Supervision/Verbal cueing   Blood pressure 118/68, pulse 94, temperature 98.1 F (36.7 C), resp. rate 18, height 5\' 10"  (1.778 m), weight 65.4 kg, SpO2 97%.  Medical Problem List and Plan: 1. 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             -ELOS/Goals: 10-12 days at w/c level  IPOC completed   Continue CIR therapies including PT, OT   Would benefit from hospital bed for safety  2. Bleeding from residual limb incision: resolved, lovenox d/ced, continue aspirin   3. Pain Management: Tylenol, oxycodone d/ced due to codeine allergy, tramadol  ordered prn   -2-15: Using Tramadol and Advil consistently; refused Robaxin overnight, is having ongoing hypotension anyway so we will DC  2-16: BPs improved, but 8 out of 10 pain overnight.  Increase tramadol to 50 to 100 mg as needed  4. Hallucinations: discussed with wife that these are a side effect to codeine, oxycodone d/ced    5. Neuropsych/cognition: currently, patient is not  capable of making decisions on his  own behalf. Confused, but hasn't received pain meds today.    -2-16: Some ongoing mild confusion, but no gross deficits on exam today  6. Skin/Wound Care: Routine skin care checks   -2-15: Some  skin breakdown and bleeding along incisional site with adherence to shrinker sock; changed wound care orders to petroleum infused gauze along incision line, reinforced with dry kerlex and tape   7. Fluids/Electrolytes/Nutrition: Routine Is and Os and follow-up chemistries             -continue vit C, zinc supplements   8: Hypertension: monitor TID and prn             -increase toprol XL to 25mg  HS             -on multiple bladder meds  - 2/15: Ongoing hypotension.  Heart rate well-controlled 60s to 70s.  Toprol was already reduced to 12.5 mg nightly on Thursday.  Placed hold parameters for SBP less than 90, DBP less than 50.  2-16: BPs improved   9: Hyperlipidemia: continue statin   10: ? OAB/? 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 Lajoyce Corners  -new shrinker was placed, continue  -2-15: Area of skin breakdown does seem slightly increased from admission, however no concerning findings of dehiscence or infection.  Photos placed in chart.  Dressings changed as above.  Will reexamine in a.m.  2/16: D/w Vascular Lianne Cure, PA, will see today or tomorrow for wound monitoring given skin breakdown and continued bleeding   13: DM: CBGs QID, A1c = 10%                   -increase metformin to 1,000mg  BID  -provided  list of foods for diabetes   CBG (last 3)  Recent Labs    08/18/23 2047 08/19/23 0601 08/19/23 1220  GLUCAP 150* 82 78    Increase semglee to 22U  Increase metformin to 850mg  BID  D/c aspart with meals  14: History of bicuspid aortic valve, moderate AV stenosis   15: History of CAD s/p LHC with PCI/stent on 07/12/2018             -completed DAPT for 12 months; continue statin             -follows with Dr. Allyson Sabal   16: Pancytopenia: follows with Dr. Mosetta Putt and gets B12 injection monthly             -follow-up CBC   17. Constipation-    -add senna to colace, schedule miralax  -Having regular bowel movements  18. Confusion: resolved with cessation of oxycodone  -Stable on tramadol  19, Hypotension: d/c toprol-XL  20. Fatigue: B12 ordered IM on 2/5, conitnue metanx  21. Polypharmacy: advised wife that we will provide her with a list of patient's medications  22. Hematoma: lovenox d/ced, continue aspirin, discussed with ortho and team ischemic changes to limb, d/c advil  23. Impaired mobility: vas [place] ordered, reviewed and is negative for clots, discussed with patient  24.  Vision deficit.  Encourage patient to bring glasses from home.  Seems worse in right eye, but responsive to blink to threat in all fields  LOS: 14 days A FACE TO FACE EVALUATION WAS PERFORMED  Clint Bolder P Datha Kissinger 08/19/2023, 1:33 PM

## 2023-08-19 NOTE — Progress Notes (Signed)
Occupational Therapy Session Note  Patient Details  Name: Adam Ray MRN: [id number] Date of Birth: 05/15/1956  Today's Date: 08/19/2023 OT Individual Time: 1120-1200 OT Individual Time Calculation (min): 40 min  OT missed time: 45 min Missed time reason: pain/refusal    Short Term Goals: Week 2:  OT Short Term Goal 1 (Week 2): STG = LTG due to ELOS  Skilled Therapeutic Interventions/Progress Updates:  Session 1 Skilled OT intervention completed with focus on sit > stands, dynamic standing balance, DC planning. Pt received seated in w/c, agreeable to session. LLE residual limb pain reported; pt requested meds however not due for any per nursing. OT offered rest breaks, repositioning and distraction throughout for pain reduction.  Pt verbalized that he refuses to leave tomorrow because his "leg isn't right." Pt requested in person visit with MD. Care team notified however did advise pt of his medical team's decision and planned DC date since last week.   Pt declined self-care needs. Transported dependently in w/c <> gym for time. Pt participated in the following dynamic standing balance and endurance tasks to promote independence and safety during BADLs and functional mobility: -placing and retrieving squigz from long mirror. Min A sit > stand with RW x2, and CGA/min A needed for balance using RW  Upon sitting, OT noticed small pool of blood on floor. Inspected LLE limb guard, with blood with large clots noted to be filled in the bottom of the guard and residual limb dressing that was just changed this AM was completely saturated. Back in room, OT retrieved PA and notified pt's care team regarding pt's medical status. Assisted PA with removal of limb guard and limb positioning, then direct care handoff to PA at end of session for wound care. All immediate needs met.  Session 2 Pt received upright in bed. Pt c/o pain in L residual limb from the tight wrapping however per pt has already  been adjusted. OT inspected limb and was intact/without drainage. Pt stated his nurse was already aware and on the way with pain meds. Pt declined therapy stating "I refuse to leave tomorrow" and "I am not going to do anything with anyone until that's figured out." Per pt, OT notified therapy supervisor regarding pt's complaints with DC plan as well as care team. All other immediate needs met. Pt missed 45 mins of OT intervention secondary to pain/refusal; OT will make up missed time as able.     Therapy Documentation Precautions:  Precautions Precautions: Fall Precaution/Restrictions Comments: LLE Wound Vac Required Braces or Orthoses: Other Brace Other Brace: LLE Limb Guard Restrictions Weight Bearing Restrictions Per Provider Order: Yes LLE Weight Bearing Per Provider Order: Non weight bearing    Therapy/Group: Individual Therapy  Melvyn Novas, MS, OTR/L  08/19/2023, 3:42 PM

## 2023-08-20 ENCOUNTER — Telehealth: Payer: Self-pay | Admitting: Orthopedic Surgery

## 2023-08-20 LAB — GLUCOSE, CAPILLARY
Glucose-Capillary: 93 mg/dL (ref 70–99)
Glucose-Capillary: 94 mg/dL (ref 70–99)
Glucose-Capillary: 122 mg/dL — ABNORMAL HIGH (ref 70–99)
Glucose-Capillary: 230 mg/dL — ABNORMAL HIGH (ref 70–99)

## 2023-08-20 NOTE — Progress Notes (Incomplete)
Inpatient Rehabilitation Discharge Medication Review by a Pharmacist  A complete drug regimen review was completed for this patient to identify any potential clinically significant medication issues.  High Risk Drug Classes Is patient taking? Indication by Medication  Antipsychotic No   Anticoagulant No   Antibiotic No   Opioid No   Antiplatelet Yes Aspirin 81 mg - CAD  Hypoglycemics/insulin Yes SSI, Insulin Glargine, Metformin - Type 2 DM  Vasoactive Medication Yes Metoprolol - hypertension  Chemotherapy No   Other Yes Finaseteride, Tamsulosin - BPH Fesoterodine, Miragebron - overactive bladder Pantoprazole - GI prophylaxis Rosuvasatin - hyperlipidemia Vitamin D, Vitamin C, Zinc x 14 days, B-12 IM x 1 on 2/5  - supplements Docusate - laxative  PRNs: Acetaminophen - mild pain Maalox - indigestion Guaifenesin-dextromethorphan - cough Melatonin - sleep Methocarbamol - muscle spasms Ondansetron - nausea, vomiting Bisacodyl, Miralax, Fleets enema - constipation     Type of Medication Issue Identified Description of Issue Recommendation(s)  Drug Interaction(s) (clinically significant)     Duplicate Therapy     Allergy     No Medication Administration End Date     Incorrect Dose     Additional Drug Therapy Needed     Significant med changes from prior encounter (inform family/care partners about these prior to discharge). Metoprolol tartrate changed to succinate Rosuvastatin dose increased 20>40 mg Communicate changes with patient/ family prior to discharge.  Other  DC summary indicates plan to resume prior Aspirin 81 mg and Furosemide 20 mg BID (reported taking daily PTA) Also Famotidine 20 mg BID prn   Also off: Trulicity. - Humibid DM - monthly B-12 injections     Therapeutic substitutions: Fesoterodine for Tolterodine Mirabegron for Vibegron Resume Aspirin per Dr. Carlis Abbott Monitor BP and fluid balance and resume Furosemide if clinically indicated.   On  pantoprazole daily, prn Maalox  Resume Trulicity at discharge. Resume Humibid E if needed. Given x 1 on 2/5    Resume home products at discharge.    Clinically significant medication issues were identified that warrant physician communication and completion of prescribed/recommended actions by midnight of the next day:  No  Pharmacist comments:    Time spent performing this drug regimen review (minutes):    Noah Delaine, RPh Clinical Pharmacist 08/20/2023 8:18 AM

## 2023-08-20 NOTE — Progress Notes (Signed)
PROGRESS NOTE   Subjective/Complaints: No new complaints this morning Patient feels unsafe regarding discharge, still fears that the blood clots we are referring to are DVTs despite education   ROS: Patient denies fever, rash, sore throat, blurred vision, dizziness, nausea, vomiting, diarrhea, cough, shortness of breath or chest pain,  headache, or mood change.  +bleeding from residual limb incision-ongoing +muscle tightness in residual limb-ongoing + Vision changes  Objective:   No results found.  Recent Labs    08/18/23 0540 08/19/23 0526  WBC 4.1 4.2  HGB 7.9* 8.6*  HCT 25.4* 27.9*  PLT 216 223     Recent Labs    08/18/23 0540  NA 136  K 4.1  CL 99  CO2 26  GLUCOSE 123*  BUN 26*  CREATININE 1.00  CALCIUM 9.2      Intake/Output Summary (Last 24 hours) at 08/20/2023 1106 Last data filed at 08/20/2023 0811 Gross per 24 hour  Intake 717 ml  Output 1100 ml  Net -383 ml        Physical Exam: Vital Signs Blood pressure 112/67, pulse 69, temperature 98.3 F (36.8 C), resp. rate 17, height 5\' 10"  (1.778 m), weight 65.4 kg, SpO2 97%. Constitutional: No distress . Vital signs reviewed.  Laying in bed. HEENT: NCAT, EOMI.  Unable to complete visual field testing throughout all fields in right eye, lateral fields and left eye.  Does blink to threat in all 4 quadrants.  Pupils constricted but equal and reactive.  Neck: supple Cardiovascular: RRR without murmur. No JVD    Respiratory/Chest: CTA Bilaterally without wheezes or rales. Normal effort    GI/Abdomen: BS +, non-tender, non-distended Ext: no clubbing, cyanosis; 2+ edema left lower extremity Psych: pleasant and cooperative  MSK: Bilateral BKA's  Skin:  2-15: Appears some increased necrotic tissue along incisional line compared to last photograph, with a few areas of bleeding/adherent to shrinker sock.  No obvious discharge or dehiscence with  palpation.  2+ edema throughout distal limb.  Ischemic changes 2/18 with bleeding       Neurological:     General: No focal deficit present.     Mental Status: He is alert and oriented to person, place, and time. Seems to be cognitively appropriate on a basic level--some mild delays. No focal weakness. --stable 2/19     Assessment/Plan: 1. Functional deficits which require 3+ hours per day of interdisciplinary therapy in a comprehensive inpatient rehab setting. Physiatrist is providing close team supervision and 24 hour management of active medical problems listed below. Physiatrist and rehab team continue to assess barriers to discharge/monitor patient progress toward functional and medical goals  Care Tool:  Bathing    Body parts bathed by patient: Right arm, Left arm, Chest, Abdomen, Front perineal area, Right upper leg, Buttocks, Left upper leg, Face   Body parts bathed by helper: Front perineal area, Buttocks, Right upper leg, Left upper leg, Face Body parts n/a: Right lower leg, Left lower leg   Bathing assist Assist Level: Set up assist     Upper Body Dressing/Undressing Upper body dressing   What is the patient wearing?: Pull over shirt    Upper body assist Assist Level:  Set up assist    Lower Body Dressing/Undressing Lower body dressing      What is the patient wearing?: Incontinence brief, Pants     Lower body assist Assist for lower body dressing: Minimal Assistance - Patient > 75%     Toileting Toileting    Toileting assist Assist for toileting: Minimal Assistance - Patient > 75%     Transfers Chair/bed transfer  Transfers assist  Chair/bed transfer activity did not occur: Safety/medical concerns (unsafet to get up)  Chair/bed transfer assist level: Supervision/Verbal cueing (lateral scoot)     Locomotion Ambulation   Ambulation assist   Ambulation activity did not occur: Safety/medical concerns (pain, decreased balance on R  prosthetic)  Assist level: Minimal Assistance - Patient > 75% Assistive device: Walker-rolling Max distance: 33ft   Walk 10 feet activity   Assist  Walk 10 feet activity did not occur: Safety/medical concerns (pain, decreased balance on R prosthetic)  Assist level: Minimal Assistance - Patient > 75% Assistive device: Walker-rolling   Walk 50 feet activity   Assist Walk 50 feet with 2 turns activity did not occur: Safety/medical concerns (fatigue, decreased balance/coordination)         Walk 150 feet activity   Assist Walk 150 feet activity did not occur: Safety/medical concerns (fatigue, decreased balance/coordination)         Walk 10 feet on uneven surface  activity   Assist Walk 10 feet on uneven surfaces activity did not occur: Safety/medical concerns (fatigue, decreased balance)         Wheelchair     Assist Is the patient using a wheelchair?: Yes Type of Wheelchair:  Armed forces technical officer)    Wheelchair assist level: Supervision/Verbal cueing Max wheelchair distance: >346ft    Wheelchair 50 feet with 2 turns activity    Assist        Assist Level: Supervision/Verbal cueing   Wheelchair 150 feet activity     Assist      Assist Level: Supervision/Verbal cueing   Blood pressure 112/67, pulse 69, temperature 98.3 F (36.8 C), resp. rate 17, height 5\' 10"  (1.778 m), weight 65.4 kg, SpO2 97%.  Medical Problem List and Plan: 1. 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             -ELOS/Goals: 10-12 days at w/c level  IPOC completed   Stable for d/c today, but patient feels unsafe discharging, discussed he has right to appeal discharge  Would benefit from hospital bed for safety  2. Bleeding from residual limb incision: resolved, lovenox d/ced, continue aspirin   3. Pain Management: Tylenol, oxycodone d/ced due to codeine allergy,  tramadol ordered prn, continue  4. Hallucinations: discussed with wife that these are a side effect to codeine, oxycodone d/ced    5. Neuropsych/cognition: currently, patient is not  capable of making decisions on his  own behalf.   6. Bleeding from incision: Hgb ordered and improved  7. Fluids/Electrolytes/Nutrition: Routine Is and Os and follow-up chemistries             -continue vit C, zinc supplements   8: Hypertension: monitor TID and prn            D/c toprol XL   9: Hyperlipidemia: continue statin   10: ? OAB/? BPH: on Proscar, Toviaz, Myrbetriq and Flomax  11: GI prophylaxis: continue Protonix   12: Left BKA 1/31 Dr. Lajoyce Corners  -new shrinker was placed, continue  -2-15: Area of skin breakdown does seem slightly increased from admission, however no concerning findings of dehiscence or infection.  Photos placed in chart.  Dressings changed as above.  Will reexamine in a.m.  2/16: D/w Vascular Lianne Cure, PA, will see today or tomorrow for wound monitoring given skin breakdown and continued bleeding   13: DM: CBGs QID, A1c = 10%                   -increase metformin to 1,000mg  BID  -provided list of foods for diabetes   CBG (last 3)  Recent Labs    08/19/23 1636 08/19/23 2046 08/20/23 0635  GLUCAP 162* 126* 93    Increase semglee to 22U  Increase metformin to 850mg  BID  D/c aspart with meals  14: History of bicuspid aortic valve, moderate AV stenosis   15: History of CAD s/p LHC with PCI/stent on 07/12/2018             -completed DAPT for 12 months; continue statin             -follows with Dr. Allyson Sabal   16: Pancytopenia: follows with Dr. Mosetta Putt and gets B12 injection monthly             -follow-up CBC   17. Constipation-    -add senna to colace, schedule miralax  -Having regular bowel movements  18. Confusion: resolved with cessation of oxycodone  -Stable on tramadol  19, Hypotension: d/c toprol-XL  20. Fatigue: B12 ordered IM on 2/5, conitnue  metanx  21. Polypharmacy: advised wife that we will provide her with a list of patient's medications  22. Hematoma: lovenox d/ced, continue aspirin, discussed with ortho and team ischemic changes to limb, d/c advil  23. Impaired mobility: vas [place] ordered, reviewed and is negative for clots, discussed with patient  24.  Vision deficit.  Encourage patient to bring glasses from home.  Seems worse in right eye, but responsive to blink to threat in all fields  LOS: 15 days A FACE TO FACE EVALUATION WAS PERFORMED  Nadelyn Enriques P Yasira Engelson 08/20/2023, 11:06 AM

## 2023-08-20 NOTE — Plan of Care (Signed)
  Problem: Sit to Stand Goal: LTG:  Patient will perform sit to stand in prep for activites of daily living with assistance level (OT) Description: LTG:  Patient will perform sit to stand in prep for activites of daily living with assistance level (OT) Outcome: Not Met (add Reason) Flowsheets (Taken 08/20/2023 1145) LTG: PT will perform sit to stand in prep for activites of daily living with assistance level: (not met due to impaired dynamic standing balance, endurance, and awareness) --   Problem: RH Balance Goal: LTG: Patient will maintain dynamic sitting balance (OT) Description: LTG:  Patient will maintain dynamic sitting balance with assistance during activities of daily living (OT) Outcome: Completed/Met Goal: LTG Patient will maintain dynamic standing with ADLs (OT) Description: LTG:  Patient will maintain dynamic standing balance with assist during activities of daily living (OT)  Outcome: Completed/Met   Problem: RH Eating Goal: LTG Patient will perform eating w/assist, cues/equip (OT) Description: LTG: Patient will perform eating with assist, with/without cues using equipment (OT) Outcome: Completed/Met   Problem: RH Grooming Goal: LTG Patient will perform grooming w/assist,cues/equip (OT) Description: LTG: Patient will perform grooming with assist, with/without cues using equipment (OT) Outcome: Completed/Met   Problem: RH Bathing Goal: LTG Patient will bathe all body parts with assist levels (OT) Description: LTG: Patient will bathe all body parts with assist levels (OT) Outcome: Completed/Met   Problem: RH Dressing Goal: LTG Patient will perform upper body dressing (OT) Description: LTG Patient will perform upper body dressing with assist, with/without cues (OT). Outcome: Completed/Met Goal: LTG Patient will perform lower body dressing w/assist (OT) Description: LTG: Patient will perform lower body dressing with assist, with/without cues in positioning using equipment  (OT) Outcome: Completed/Met   Problem: RH Toileting Goal: LTG Patient will perform toileting task (3/3 steps) with assistance level (OT) Description: LTG: Patient will perform toileting task (3/3 steps) with assistance level (OT)  Outcome: Completed/Met   Problem: RH Toilet Transfers Goal: LTG Patient will perform toilet transfers w/assist (OT) Description: LTG: Patient will perform toilet transfers with assist, with/without cues using equipment (OT) Outcome: Completed/Met   Problem: RH Awareness Goal: LTG: Patient will demonstrate awareness during functional activites type of (OT) Description: LTG: Patient will demonstrate awareness during functional activites type of (OT) Outcome: Completed/Met

## 2023-08-20 NOTE — Telephone Encounter (Signed)
Pt wife, Adam Ray, has called the office reporting that pt is supposed to be discharged today but she doesn't want him to be discharged due to him having blood clots. Please advise.

## 2023-08-20 NOTE — Telephone Encounter (Signed)
Yes, we are aware, he just had surgery last week and is in inpatient rehab.

## 2023-08-20 NOTE — Progress Notes (Addendum)
Patient ID: Adam Ray, male   DOB: 08/27/1955, 68 y.o.   MRN: [id number]  Per attending, pt would like to appeal his discharge.   8657- SW called pt wife to inform on above, and shared will provide appeal information to patient. SW discussed appeal process, informing if appeal is denied, that he will be responsible for any days from date of discharge until appeal decision is made. She reported discontent with pt leaving, sharing that he should not be discharged due to a blood clot. She would like to hear from Dr. Carlis Abbott  SW provided pt with appeals sheet and explained appeals process.   SW updated [facility] on appeal and will share updates once available.   SW canceled transportation with [employer].   Cecile Sheerer, MSW, LCSW Office: [phone] Cell: [phone] Fax: [phone]

## 2023-08-20 NOTE — Telephone Encounter (Signed)
Patient called. Would like Dr. Lajoyce Corners to know that he is in the hospital

## 2023-08-20 NOTE — Patient Care Conference (Signed)
Inpatient RehabilitationTeam Conference and Plan of Care Update Date: 08/20/2023   Time: 11:34 AM    Patient Name: Adam Ray      Medical Record Number: [id number]  Date of Birth: 05-17-1956 Sex: Male         Room/Bed: 4W22C/4W22C-01 Payor Info: Payor: [employer] / Plan: [facility] / Product Type: *No Product type* /    Admit Date/Time:  08/05/2023  6:00 PM  Primary Diagnosis:  S/P BKA (below knee amputation) unilateral, left [facility])  Hospital Problems: Principal Problem:   S/P BKA (below knee amputation) unilateral, left (HCC) Active Problems:   S/P BKA (below knee amputation) (HCC)    Expected Discharge Date: Expected Discharge Date:  (d/c pending appeal)  Team Members Present: Physician leading conference: Dr. Sula Soda Social Worker Present: Dossie Der, LCSW Nurse Present: Chana Bode, RN PT Present: Ambrose Finland, PT OT Present: Candee Furbish, OT PPS Coordinator present : Fae Pippin, SLP     Current Status/Progress Goal Weekly Team Focus  Bowel/Bladder   continent of b/b LBM per patient 2/17 charted BM 2/15   maintain continence   offer toileting qshift and PRN    Swallow/Nutrition/ Hydration               ADL's   Set up A UB, Min A LB, Min A toileting; at goal level   Min A   Pt appealing DC    Mobility   bed mobility mod I, lateral scoot transfers supervision, sit to stand: min A gait 20 feet RW and min A, WC mobility supervision   supervision  pt appealing discharge    Communication                Safety/Cognition/ Behavioral Observations               Pain   Patient c/o 10/10 pain in stump using scheduled tylenol and q6 PRN tramadol, but no relief in pain   get control of pain <5/10   assess pain qshift and PRN    Skin   New BKA. stump has been bleeding with clots using compression and ice to help with bleeding. Stump has not bled at night when stump is elevated   promote healing of stomp and  control bleeding  assess dressing and perform dressing changes PRN also encourage patient to keep limb elevated      Discharge Planning:  Pt will d/c tohome with his s/o. Pt wife works PT M-F 8am-12:30pm. Pt wants all services through [place]. Pt is not service connected and only eligible for [facility] and DME. [facility] aide referral placed. [facility]HA- [facility] for HHPT/OT/SLP/aide/SN. DME- hospital bed, TTB, bariatric DABSC, and speicalty w/c -to be delivered to home by [place].  WIll confirm there are no barriers to discharge.   Team Discussion: Patient post left BKA with bleeding from incision/hematoma addressed and ischemic changes but deemed medically ready for discharge. Limited by cognitive impairment and poor compliance with recommendations for elevation of limb  Patient on target to meet rehab goals: Yes, currently needs set up for upper body care and min assist for lower body and toileting.Able to complete sit - stand with min assist but completes lateral scoots with supervision and ambulate 120' using a RW with supervision  *See Care Plan and progress notes for long and short-term goals.   Revisions to Treatment Plan:  Hemistat absorb dressing application/compression dressing application   Teaching Needs: Safety, skin care, medications, dietary modification, transfers, toileting,  etc.   Current Barriers to Discharge: Decreased caregiver support and Weight bearing restrictions  Possible Resolutions to Barriers: Family education completed HH follow up services DME: TTB/[facility]     Medical Summary Current Status: type 2 diabetes, left BKA, bleeding from incision  Barriers to Discharge: Medical stability  Barriers to Discharge Comments: type 2 diabetes, left BKA, bleeding from incision Possible Resolutions to Barriers/Weekly Focus: d/c aspart, d/c novolog, continue long acting semglee, continue metformin, discussed iscemic changes, d/c advil, d/c lovenox, vascular consulted   Continued Need for  Acute Rehabilitation Level of Care: The patient requires daily medical management by a physician with specialized training in physical medicine and rehabilitation for the following reasons: Direction of a multidisciplinary physical rehabilitation program to maximize functional independence : Yes Medical management of patient stability for increased activity during participation in an intensive rehabilitation regime.: Yes Analysis of laboratory values and/or radiology reports with any subsequent need for medication adjustment and/or medical intervention. : Yes   I attest that I was present, lead the team conference, and concur with the assessment and plan of the team.   Chana Bode B 08/21/2023, 7:58 AM

## 2023-08-20 NOTE — Telephone Encounter (Signed)
SW wife, she is informed. She said she SW the inpatient rehab MD and they have it figured out for him. He is going to be staying longer.

## 2023-08-21 ENCOUNTER — Telehealth: Payer: Self-pay

## 2023-08-21 ENCOUNTER — Telehealth: Payer: Self-pay | Admitting: Orthopedic Surgery

## 2023-08-21 LAB — GLUCOSE, CAPILLARY
Glucose-Capillary: 161 mg/dL — ABNORMAL HIGH (ref 70–99)
Glucose-Capillary: 184 mg/dL — ABNORMAL HIGH (ref 70–99)
Glucose-Capillary: 163 mg/dL — ABNORMAL HIGH (ref 70–99)
Glucose-Capillary: 185 mg/dL — ABNORMAL HIGH (ref 70–99)

## 2023-08-21 MED ORDER — INSULIN GLARGINE-YFGN 100 UNIT/ML ~~LOC~~ SOLN
24.0000 [IU] | Freq: Every day | SUBCUTANEOUS | Status: DC
  Administered 2023-08-21 – 2023-08-22 (×2): 24 [IU] via SUBCUTANEOUS
  Filled 2023-08-21 (×3): qty 0.24

## 2023-08-21 NOTE — Telephone Encounter (Signed)
Pt called this morning concerned about being discharged from hospital, he doesn't feel safe going home. Says he has a blood clot and is still bleeding from his incision.  Pt wife just called a minute ago with same concerns. She said that the [place] said he can stay in there as long as he can. I stated he is in inpatient rehab, it is not ultimately up to Dr. Lajoyce Corners to make that discharge  decision at this point.     I also explained this to pt's wife yesterday when she called the office.  Can you please call Marylene Land, pt's wife and explain to her what the process is? [phone]

## 2023-08-21 NOTE — Progress Notes (Signed)
Per Provider contact the OR for hemostatic gauze dressing. OR contacted.

## 2023-08-21 NOTE — Progress Notes (Signed)
Patient ID: Adam Ray, male   DOB: 04/08/1956, 68 y.o.   MRN: [id number]   1132- SW called [facility] [phone]) to inquire if pt had submitted appeal. Pt has not.   SW met with pt during PT session to complete appeal. SW reiterated pt would need to follow through with appeal process given he was reported to be medically ready for discharge. He has reported that he would not leave if appeal was denied. He states he hsa 30 more days. SW shared this is not the case, and the reason he must appeal the hospital discharge is due to the [place] adhering to recommendations provided by medical team. Appeal completed and SW waiting on follow-up from [facility].  SW received fax from [place] asking about clinicals. SW uploaded to portal. SW waiting on updates on if appeal is accepted.   Appeal with [facility] ID# [id number]  Cecile Sheerer, MSW, LCSW Office: [phone] Cell: [phone] Fax: [phone]

## 2023-08-21 NOTE — Progress Notes (Signed)
PROGRESS NOTE   Subjective/Complaints: Patient reports that he is not good as he does not feel ready to go home yet, discussed with PT and he has not yet met sit to stand goal   ROS: Patient denies fever, rash, sore throat, blurred vision, dizziness, nausea, vomiting, diarrhea, cough, shortness of breath or chest pain,  headache, or mood change.  +bleeding from residual limb- resolved +muscle tightness in residual limb-ongoing + Vision changes  Objective:   No results found.  Recent Labs    08/19/23 0526  WBC 4.2  HGB 8.6*  HCT 27.9*  PLT 223     No results for input(s): "NA", "K", "CL", "CO2", "GLUCOSE", "BUN", "CREATININE", "CALCIUM" in the last 72 hours.     Intake/Output Summary (Last 24 hours) at 08/21/2023 1350 Last data filed at 08/21/2023 1318 Gross per 24 hour  Intake 711 ml  Output 1750 ml  Net -1039 ml        Physical Exam: Vital Signs Blood pressure 134/76, pulse 94, temperature 98.3 F (36.8 C), temperature source Oral, resp. rate 18, height 5\' 10"  (1.778 m), weight 65.4 kg, SpO2 99%. Constitutional: No distress . Vital signs reviewed.  Laying in bed. HEENT: NCAT, EOMI.  Unable to complete visual field testing throughout all fields in right eye, lateral fields and left eye.  Does blink to threat in all 4 quadrants.  Pupils constricted but equal and reactive.  Neck: supple Cardiovascular: RRR without murmur. No JVD    Respiratory/Chest: CTA Bilaterally without wheezes or rales. Normal effort    GI/Abdomen: BS +, non-tender, non-distended Ext: no clubbing, cyanosis; 2+ edema left lower extremity Psych: pleasant and cooperative  MSK: Bilateral BKA's  Skin:  2-15: Appears some increased necrotic tissue along incisional line compared to last photograph, with a few areas of bleeding/adherent to shrinker sock.  No obvious discharge or dehiscence with palpation.  2+ edema throughout distal  limb.  Ischemic changes 2/20, bleeding has stopped       Neurological:     General: No focal deficit present.     Mental Status: He is alert and oriented to person, place, and time. Seems to be cognitively appropriate on a basic level--some mild delays. No focal weakness. --stable 2/19     Assessment/Plan: 1. Functional deficits which require 3+ hours per day of interdisciplinary therapy in a comprehensive inpatient rehab setting. Physiatrist is providing close team supervision and 24 hour management of active medical problems listed below. Physiatrist and rehab team continue to assess barriers to discharge/monitor patient progress toward functional and medical goals  Care Tool:  Bathing    Body parts bathed by patient: Right arm, Left arm, Chest, Abdomen, Front perineal area, Right upper leg, Buttocks, Left upper leg, Face   Body parts bathed by helper: Front perineal area, Buttocks, Right upper leg, Left upper leg, Face Body parts n/a: Right lower leg, Left lower leg   Bathing assist Assist Level: Set up assist     Upper Body Dressing/Undressing Upper body dressing   What is the patient wearing?: Pull over shirt    Upper body assist Assist Level: Set up assist    Lower Body Dressing/Undressing Lower body  dressing      What is the patient wearing?: Incontinence brief, Pants     Lower body assist Assist for lower body dressing: Minimal Assistance - Patient > 75%     Toileting Toileting    Toileting assist Assist for toileting: Minimal Assistance - Patient > 75%     Transfers Chair/bed transfer  Transfers assist  Chair/bed transfer activity did not occur: Safety/medical concerns (unsafet to get up)  Chair/bed transfer assist level: Supervision/Verbal cueing (lateral scoot)     Locomotion Ambulation   Ambulation assist   Ambulation activity did not occur: Safety/medical concerns (pain, decreased balance on R prosthetic)  Assist level: Minimal  Assistance - Patient > 75% Assistive device: Walker-rolling Max distance: 63ft   Walk 10 feet activity   Assist  Walk 10 feet activity did not occur: Safety/medical concerns (pain, decreased balance on R prosthetic)  Assist level: Minimal Assistance - Patient > 75% Assistive device: Walker-rolling   Walk 50 feet activity   Assist Walk 50 feet with 2 turns activity did not occur: Safety/medical concerns (fatigue, decreased balance/coordination)         Walk 150 feet activity   Assist Walk 150 feet activity did not occur: Safety/medical concerns (fatigue, decreased balance/coordination)         Walk 10 feet on uneven surface  activity   Assist Walk 10 feet on uneven surfaces activity did not occur: Safety/medical concerns (fatigue, decreased balance)         Wheelchair     Assist Is the patient using a wheelchair?: Yes Type of Wheelchair:  Armed forces technical officer)    Wheelchair assist level: Supervision/Verbal cueing Max wheelchair distance: >334ft    Wheelchair 50 feet with 2 turns activity    Assist        Assist Level: Supervision/Verbal cueing   Wheelchair 150 feet activity     Assist      Assist Level: Supervision/Verbal cueing   Blood pressure 134/76, pulse 94, temperature 98.3 F (36.8 C), temperature source Oral, resp. rate 18, height 5\' 10"  (1.778 m), weight 65.4 kg, SpO2 99%.  Medical Problem List and Plan: 1. 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             -ELOS/Goals: 10-12 days at w/c level  IPOC completed   Continues to be stable for discharge, discharge order placed, but patient feels unsafe discharging, discussed he has right to appeal discharge  Would benefit from hospital bed for safety  2. Bleeding from residual limb incision: resolved, lovenox d/ced, continue aspirin   3. Pain Management: Tylenol, oxycodone  d/ced due to codeine allergy, tramadol ordered prn, continue  4. Hallucinations: discussed with wife that these are a side effect to codeine, oxycodone d/ced    5. Neuropsych/cognition: currently, patient is not  capable of making decisions on his  own behalf.   6. Bleeding from incision: Hgb ordered and improved, resolved  7. Fluids/Electrolytes/Nutrition: Routine Is and Os and follow-up chemistries             -continue vit C, zinc supplements   8: Hypertension: monitor TID and prn            D/c toprol XL, d/c finasteride   9: Hyperlipidemia: continue statin   10: ? OAB/? BPH: d/c finasteride due to hypotension, continue Toviaz,  Myrbetriq and Flomax   11: GI prophylaxis: continue Protonix   12: Left BKA 1/31 Dr. Lajoyce Corners  -new shrinker was placed, continue  -2-15: Area of skin breakdown does seem slightly increased from admission, however no concerning findings of dehiscence or infection.  Photos placed in chart.  Dressings changed as above.  Will reexamine in a.m.  2/16: D/w Vascular Lianne Cure, PA, will see today or tomorrow for wound monitoring given skin breakdown and continued bleeding   13: DM: CBGs QID, A1c = 10%                   -increase metformin to 1,000mg  BID  -provided list of foods for diabetes   CBG (last 3)  Recent Labs    08/20/23 2045 08/21/23 0611 08/21/23 1238  GLUCAP 230* 161* 184*    Increase semglee to 23U  Increase metformin to 850mg  BID  D/c aspart with meals  14: History of bicuspid aortic valve, moderate AV stenosis   15: History of CAD s/p LHC with PCI/stent on 07/12/2018             -completed DAPT for 12 months; continue statin             -follows with Dr. Allyson Sabal   16: Pancytopenia: follows with Dr. Mosetta Putt and gets B12 injection monthly             -follow-up CBC   17. Constipation-    -add senna to colace, schedule miralax  -Having regular bowel movements  18. Confusion: resolved with cessation of oxycodone  -Stable on  tramadol  19, Hypotension: d/c toprol-XL  20. Fatigue: B12 ordered IM on 2/5, conitnue metanx  21. Polypharmacy: advised wife that we will provide her with a list of patient's medications  22. Hematoma: lovenox d/ced, continue aspirin, discussed with ortho and team ischemic changes to limb, d/c advil  23. Impaired mobility: vas [place] ordered, reviewed and is negative for clots, discussed with patient  24.  Vision deficit.  Encourage patient to bring glasses from home.  Seems worse in right eye, but responsive to blink to threat in all fields  LOS: 16 days A FACE TO FACE EVALUATION WAS PERFORMED  Adam Ray 08/21/2023, 1:50 PM

## 2023-08-21 NOTE — Telephone Encounter (Signed)
Patient called wanting to speak with Dr. Audrie Lia nurse.  Asked him if I could help, stated that he needed to speak with Dr. Audrie Lia nurse.  CB# [phone].  Please advise.  Thank you

## 2023-08-21 NOTE — Progress Notes (Signed)
Occupational Therapy Session Note  Patient Details  Name: Adam Ray MRN: [id number] Date of Birth: October 17, 1955  Today's Date: 08/21/2023 OT Individual Time: 1345-1425 OT Individual Time Calculation (min): 40 min    Short Term Goals: Week 2:  OT Short Term Goal 1 (Week 2): STG = LTG due to ELOS  Skilled Therapeutic Interventions/Progress Updates:    Pt resting in bed upon arrival. Pt declined getting OOB until MD visit to inspect LLE. Pt agreeable to bed level BUE therex: 3# bar reaching out and up 3x10 and red theraband 3x15 punches with rest breaks. Pt with concerns about LLE keeps bleeding. Therapeutic listening employed and emotional support provided. Pt remained in bed with all needs within reach. Bed alarm ativted.   Therapy Documentation Precautions:  Precautions Precautions: Fall Precaution/Restrictions Comments: LLE Wound Vac Required Braces or Orthoses: Other Brace Other Brace: LLE Limb Guard Restrictions Weight Bearing Restrictions Per Provider Order: Yes LLE Weight Bearing Per Provider Order: Non weight bearing Pain:  Pt reports 8/10 LLE pain; meds admin by LPN during session, emotional support   Therapy/Group: Individual Therapy  Rich Brave 08/21/2023, 2:46 PM

## 2023-08-21 NOTE — Progress Notes (Signed)
Physical Therapy Session Note  Patient Details  Name: Adam Ray MRN: [id number] Date of Birth: Dec 08, 1955  Today's Date: 08/21/2023 PT Individual Time: 0800-0915, 1100-1140 PT Individual Time Calculation (min): 75 min, 40 min  Missed time: 20 minutes  Short Term Goals: Week 2:  PT Short Term Goal 1 (Week 2): STG=LTG due to LOS  Skilled Therapeutic Interventions/Progress Updates:    Session 1: pt received in bed and agreeable to therapy. Pt reports "no pain right now."  Bed mobility with supervision for safety. Donned/doffed RLE prosthetic independently x 2 during session. Donned LLE limb guard with min a to position and increased time for fastening straps. Pt stood to RW with min a and elevated bed after attempting from low bed without success. Stand pivot transfer with RW and CGA, VC for safety regarding chair position.   Pt propelled w/c to day room with BUE, PT provided assist to maintain course d/t chair pulling to R side to allow pt to propel faster.   Pt performed CGA lateral scoot transfer <>mat table and was able to describe technique to this therapist in order to direct care and set up transfer. Mat mobility including supine<>sit and rolling with supervision.   Pt performed the following exercises to promote LE strength and endurance:  -prone hip extension 3 x 10 BIL-during 1-2 minute rest breaks pt educated on prone stretching to prevent hip contractures, pt expressed understanding -supine bridges with B residual limbs supported on bolster with 20# on hips for increased difficulty, 2 x 2 x 20 with 30 sec rest breaks to target endurance  -supine chest press with 10# dumbbell, 4 x 12 with 1 min rest breaks (2 sets alternated with 2 sets of bridges  Pt returned to room and to bed with lateral scoot, was left with all needs in reach and alarm active.   Session 2: Pt recd as direct hand off from previous PT. No complaint of pain.   Pt returned directed in sidelying abduction 3  x 10 BIL, requiring extensive cueing for technique to avoid hip flexion. Min a to complete rolling d/t repetition. Supine<>sit with supervision.  Pt required increased time and assist for dinning RLE prosthetic d/t difficulty lining up pin, he states this sometimes happens at home as well. Pt directed in Sit to stand from elevated bed (near height of scooter seat) for strength and balance. Min a to CGA for intermittent reps, cues for pushing off RUE for best power production and slow controlled descent. Performed 2 x 5.   CSW in to discuss pending d/c and appeal process, pt missed x 20 min while completing this process. Pt was assisted back to bed at end of session, was left with all needs in reach and alarm active.    Therapy Documentation Precautions:  Precautions Precautions: Fall Precaution/Restrictions Comments: LLE Wound Vac Required Braces or Orthoses: Other Brace Other Brace: LLE Limb Guard Restrictions Weight Bearing Restrictions Per Provider Order: Yes LLE Weight Bearing Per Provider Order: Non weight bearing General:   Vital Signs:   Pain:   Mobility:   Locomotion :    Trunk/Postural Assessment :    Balance:   Exercises:   Other Treatments:      Therapy/Group: Individual Therapy  Juluis Rainier 08/21/2023, 8:32 AM

## 2023-08-21 NOTE — Telephone Encounter (Signed)
Pt informed that Dr. Lajoyce Corners cannot make the decision of him being discharged since pt is in inpatient rehab. Pt doesn't want to be discharged, this is something he needs to discuss with that team.

## 2023-08-21 NOTE — Progress Notes (Signed)
Physical Therapy Session Note  Patient Details  Name: NICHAEL EHLY MRN: [id number] Date of Birth: 1956-05-13  Today's Date: 08/21/2023 PT Individual Time: 1035-1100 PT Individual Time Calculation (min): 25 min   Short Term Goals: Week 2:  PT Short Term Goal 1 (Week 2): STG=LTG due to LOS  Skilled Therapeutic Interventions/Progress Updates:     Pt received supine in bed and agreeable to therapy session. Pt expressed desire to take shower and explained role of PT vs OT. Pt notes significant unrated pain in L residual limb, nsg notified for pain medication.   Pt L limb found to be in ace wrap instead of shrinker so discussed with nsg and case manager to discuss dressing changes and most updated orders. See nsg note from Karie Chimera for clarification with provider. No change made to ace wrap during session.  Supine TherEx for LE strength and ROM: - supine SLR 1x10, increased difficulty on L - supine hip abduction 1x10  Direct handoff to PT.  Therapy Documentation Precautions:  Precautions Precautions: Fall Precaution/Restrictions Comments: LLE Wound Vac Required Braces or Orthoses: Other Brace Other Brace: LLE Limb Guard Restrictions Weight Bearing Restrictions Per Provider Order: Yes LLE Weight Bearing Per Provider Order: Non weight bearing General:   Therapy/Group: Individual Therapy  Collins [place] 08/21/2023, 12:52 PM

## 2023-08-21 NOTE — Progress Notes (Signed)
Provider contacted to verify orders for wound dressing for patient's left BKA surgical incision area. Per Provider due to issues regarding bleeding from incision site Ace Wrap/Kurlex/ABD Pads used till bleeding stopped. Dressing changed this morning no bleeding observed at this time. Provider explained to return back to original orders in chart.

## 2023-08-22 ENCOUNTER — Other Ambulatory Visit (HOSPITAL_COMMUNITY): Payer: Self-pay

## 2023-08-22 ENCOUNTER — Encounter: Payer: Self-pay | Admitting: Hematology

## 2023-08-22 LAB — GLUCOSE, CAPILLARY
Glucose-Capillary: 143 mg/dL — ABNORMAL HIGH (ref 70–99)
Glucose-Capillary: 192 mg/dL — ABNORMAL HIGH (ref 70–99)

## 2023-08-22 MED ORDER — CYANOCOBALAMIN 1000 MCG/ML IJ SOLN: 1000.0000 ug | INTRAMUSCULAR | Status: DC

## 2023-08-22 MED ORDER — ASCORBIC ACID 1000 MG PO TABS: 1000.0000 mg | ORAL_TABLET | Freq: Every day | ORAL | 0 refills | Status: DC

## 2023-08-22 MED ORDER — CYCLOBENZAPRINE HCL 5 MG PO TABS
5.0000 mg | ORAL_TABLET | Freq: Three times a day (TID) | ORAL | 0 refills | Status: DC | PRN
  Filled 2023-08-22: qty 10, 4d supply, fill #0

## 2023-08-22 MED ORDER — BLOOD GLUCOSE MONITOR SYSTEM W/DEVICE KIT
1.0000 | PACK | Freq: Three times a day (TID) | 0 refills | Status: AC
  Filled 2023-08-22: qty 1, 365d supply, fill #0

## 2023-08-22 MED ORDER — TRAMADOL HCL 50 MG PO TABS
50.0000 mg | ORAL_TABLET | Freq: Four times a day (QID) | ORAL | 0 refills | Status: DC | PRN
Start: 2023-08-22 — End: 2023-09-02
  Filled 2023-08-22: qty 30, 4d supply, fill #0

## 2023-08-22 MED ORDER — ASCORBIC ACID 1000 MG PO TABS
1000.0000 mg | ORAL_TABLET | Freq: Every day | ORAL | 0 refills | Status: DC
  Filled 2023-08-22: qty 30, 30d supply, fill #0

## 2023-08-22 MED ORDER — ACETAMINOPHEN 325 MG PO TABS: 325.0000 mg | ORAL_TABLET | Freq: Four times a day (QID) | ORAL | Status: DC | PRN

## 2023-08-22 MED ORDER — CYANOCOBALAMIN 1000 MCG/ML IJ SOLN
1000.0000 ug | INTRAMUSCULAR | 0 refills | Status: DC
  Filled 2023-08-22: qty 1, 30d supply, fill #0

## 2023-08-22 MED ORDER — METFORMIN HCL 850 MG PO TABS
850.0000 mg | ORAL_TABLET | Freq: Two times a day (BID) | ORAL | 0 refills | Status: DC
  Filled 2023-08-22: qty 60, 30d supply, fill #0

## 2023-08-22 MED ORDER — LANTUS SOLOSTAR 100 UNIT/ML ~~LOC~~ SOPN: 23.0000 [IU] | PEN_INJECTOR | Freq: Every day | SUBCUTANEOUS | Status: DC

## 2023-08-22 MED ORDER — LANCETS MISC. MISC
1.0000 | Freq: Three times a day (TID) | 0 refills | Status: AC
Start: 2023-08-22 — End: 2023-09-21
  Filled 2023-08-22: qty 100, 30d supply, fill #0

## 2023-08-22 MED ORDER — ACCU-CHEK SOFTCLIX LANCETS MISC
0 refills | Status: AC
  Filled 2023-08-22: qty 100, 30d supply, fill #0

## 2023-08-22 MED ORDER — DICLOFENAC SODIUM 1 % EX GEL
2.0000 g | Freq: Four times a day (QID) | CUTANEOUS | Status: DC | PRN
  Administered 2023-08-22: 2 g via TOPICAL
  Filled 2023-08-22: qty 100

## 2023-08-22 MED ORDER — BLOOD GLUCOSE TEST VI STRP
1.0000 | ORAL_STRIP | Freq: Three times a day (TID) | 0 refills | Status: AC
  Filled 2023-08-22: qty 100, 34d supply, fill #0

## 2023-08-22 MED ORDER — ACETAMINOPHEN 325 MG PO TABS: 325.0000 mg | ORAL_TABLET | Freq: Four times a day (QID) | ORAL | Status: AC | PRN

## 2023-08-22 MED ORDER — LANCET DEVICE MISC
1.0000 | Freq: Three times a day (TID) | 0 refills | Status: AC
Start: 2023-08-22 — End: 2023-09-21
  Filled 2023-08-22: qty 1, 30d supply, fill #0

## 2023-08-22 MED ORDER — VITAMIN D3 25 MCG PO TABS
1000.0000 [IU] | ORAL_TABLET | Freq: Every day | ORAL | 0 refills | Status: AC
  Filled 2023-08-22: qty 30, 30d supply, fill #0

## 2023-08-22 NOTE — Progress Notes (Signed)
Occupational Therapy Session Note  Patient Details  Name: Adam Ray MRN: [id number] Date of Birth: 1955-11-11  Today's Date: 08/22/2023 OT Individual Time: 0805-0900 & 1115-1200 OT Individual Time Calculation (min): 55 min & 45 min   Short Term Goals: Week 2:  OT Short Term Goal 1 (Week 2): STG = LTG due to ELOS  Skilled Therapeutic Interventions/Progress Updates:  Session 1 Skilled OT intervention completed with focus on ADL retraining, functional endurance, and mobility within a shower context. Pt received upright in bed, agreeable to session. Unrated pain reported in L knee of residual limb; nurse notified of pain med request and present to administer. OT offered rest breaks, repositioning and moist heat via shower for pain relief.  Pt completed all lateral scoot transfers with CGA/supervision from level surface, but min A to unlevel/larger gap surfaces with verbal cues for technique.  Transitioned to EOB with mod I, then transfer > w/c > TTB. Pt doffed all clothing with supervision, with brief noted to be heavily incontinent of urinary void; pt unaware. Waterproof cover applied to L AKA prior to shower. Pt was able to bathe all parts with set up A, at the seated level only, completing lateral leans to access periareas. Transfer > w/c. Able to donn shirt/deo with set up A. Threaded LB clothing with supervision, RLE prosthesis with set up A, however min A for donning clothes over buttocks with pt using BUE on arm rest for push up. Donned limb guard with set up A.  Pt remained seated in w/c, seated at sink set up with oral care items, with belt alarm on/activated, and with all needs in reach at end of session.  Session 2 Skilled OT intervention completed with focus on tub/shower transfer education, BUE endurance. Pt received seated in w/c, agreeable to session. No pain reported.  Pt declined self-care needs. Transported dependently in w/c > ADL bathroom. Pt reports bathroom set up as  tub/shower with curtain. Discussed use of TTB for increasing safety with transferring in/out of shower and for conserving energy during bathing. Advised use of hand held shower head for ease of bathing. Demonstrated method of tucking shower curtain under buttocks to prevent water spillage in floor. Discussed using lateral leans for peri-washing and use/installation of grab bars for balance. Pt with very poor awareness/problem solving stating "I am going to shower with my prosthesis on so I can stand in shower, I just have to take the shoe of the foot." OT strongly advised against this and educated on importance of doffing LLE limb guard, donning bag to keep wound water proof, and doffing prosthesis and remaining seated. Pt was able to return demo CGA lateral scoot w/c <> TTB. Cues needed for body positioning and sequencing.   Pt completed the following intervals while seated at the SCIFIT to promote global/BUE endurance needed for independence with BADLs and functional transfers: -5 min, level 8, forwards -5 min, level 8, backwards Intermittent rest breaks needed for fatigue  Transported dependently in w/c > room. Pt remained seated in w/c, with belt alarm on/activated, and with all needs in reach at end of session.   Therapy Documentation Precautions:  Precautions Precautions: Fall Required Braces or Orthoses: Other Brace Other Brace: LLE Limb Guard Restrictions Weight Bearing Restrictions Per Provider Order: Yes LLE Weight Bearing Per Provider Order: Non-Weight bearing    Therapy/Group: Individual Therapy  Josephmichael Lisenbee E Galo Sayed, MS, OTR/L  08/22/2023, 12:12 PM

## 2023-08-22 NOTE — Progress Notes (Signed)
Occupational Therapy Weekly Progress Note  Patient Details  Name: CHAYTON MURATA MRN: [id number] Date of Birth: Jan 23, 1956  Beginning of progress report period: August 13, 2023 End of progress report period: August 22, 2023   Pt has met 10/11 LTGs. STG not created due to pt scheduled to DC on 2/19, however pt currently appealing the DC and awaiting approval/decision. Pt is at goal level for BADLs supervision/min A, however only barrier is inability to stand without min A vs CGA and functional endurance/cognition (awareness, problem solving, memory). Pt can complete lateral scoot transfer to various surfaces with supervision/CGA. Pt will benefit from continued skilled OT services to progress his standing ability and other mentioned deficits. Family ed already completed.    Patient continues to demonstrate the following deficits: muscle weakness, decreased cardiorespiratoy endurance, decreased coordination, decreased awareness, decreased problem solving, decreased safety awareness, and decreased memory, and decreased standing balance and therefore will continue to benefit from skilled OT intervention to enhance overall performance with BADL and Reduce care partner burden.  Patient progressing toward long term goals..  Continue plan of care.  OT Short Term Goals Week 1:  OT Short Term Goal 1 (Week 1): Pt will complete sit > stand in prep for ADL with mod A using LRAD OT Short Term Goal 1 - Progress (Week 1): Met OT Short Term Goal 2 (Week 1): Pt will complete 1/3 toileting steps with mod A for balance OT Short Term Goal 2 - Progress (Week 1): Met OT Short Term Goal 3 (Week 1): Pt will donn LB clothing with mod A OT Short Term Goal 3 - Progress (Week 1): Met OT Short Term Goal 4 (Week 1): Pt will complete toilet transfer with mod A using LRAD OT Short Term Goal 4 - Progress (Week 1): Met Week 2:  OT Short Term Goal 1 (Week 2): STG = LTG due to ELOS Week 3:  OT Short Term Goal 1 (Week 3): STG =  LTG due to ELOS    Bitha Fauteux E Tomie Elko, MS, OTR/L  08/22/2023, 10:20 AM

## 2023-08-22 NOTE — Progress Notes (Signed)
Occupational Therapy Discharge Summary   Patient Details  Name: LABRANDON KNOCH MRN: [id number] Date of Birth: [birth year]   Date of Discharge from OT service:August 22, 2023     Patient has met 10 of 11 long term goals due to improved activity tolerance, improved balance, postural control, ability to compensate for deficits, and improved coordination.  Patient to discharge at [facility] Assist level.  Patient's care partner is independent to provide the necessary physical and cognitive assistance at discharge. Pt's wife completed hands on training/education prior to DC regarding functional transfer and ADL recommendations.     Reasons goals not met: Pt did not meet sit > stand goal of CGA, due to increased assist needed for global deconditioning, dynamic standing balance and GM coordination deficits   Recommendation:  Patient will benefit from ongoing skilled OT services in home health setting to continue to advance functional skills in the area of BADL and Reduce care partner burden.   Equipment: Bariatric drop arm BSC   Reasons for discharge: treatment goals met and discharge from hospital   Patient/family agrees with progress made and goals achieved: Yes   OT Discharge Precautions/Restrictions  Precautions Precautions: Fall Required Braces or Orthoses: Other Brace Other Brace: LLE Limb Guard Restrictions Weight Bearing Restrictions Per Provider Order: Yes LLE Weight Bearing Per Provider Order: Non weight bearing ADL ADL Eating: Set up Where Assessed-Eating: Wheelchair Grooming: Setup Where Assessed-Grooming: Sitting at sink Upper Body Bathing: Setup Where Assessed-Upper Body Bathing: Shower Lower Body Bathing: Setup Where Assessed-Lower Body Bathing: Shower Upper Body Dressing: Setup Where Assessed-Upper Body Dressing: Wheelchair Lower Body Dressing: Setup Where Assessed-Lower Body Dressing: Bed level Toileting: Minimal assistance Where Assessed-Toileting: Bedside  Commode Toilet Transfer: Close supervision Toilet Transfer Method: Other (comment) (lateral scoot) Acupuncturist: Extra wide drop arm bedside commode Tub/Shower Transfer: Contact guard Tub/Shower Transfer Method: Squat pivot (lateral scoot) Tub/Shower Equipment: Insurance underwriter: Administrator, arts Method: Other (comment) (lateral scoot) Astronomer: Emergency planning/management officer, Grab bars Vision Baseline Vision/History: 1 Wears glasses Patient Visual Report: Blurring of vision Vision Assessment?: Vision impaired- to be further tested in functional context Additional Comments: Pt reports being unable to see details or words, but can see figures/outlines and objects functionally, reporting this has changed since surgery Perception  Perception: Impaired Praxis Praxis: WFL Cognition Cognition Overall Cognitive Status: Within Functional Limits for tasks assessed Arousal/Alertness: Awake/alert Memory: Appears intact Awareness: Impaired Problem Solving: Impaired Safety/Judgment: Impaired Comments: decreased insight into deficits Sensation Sensation Light Touch: Impaired Detail Hot/Cold: Not tested Proprioception: Impaired by gross assessment Stereognosis: Not tested Additional Comments: getting L and R confused; absent sensation along distal aspect of R residual limb. 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, global weakness/deconditioning, decreased standing balance/coordination, and  pain Mobility  Bed Mobility Bed Mobility: Rolling Right;Rolling Left;Sit to Supine;Supine to Sit Rolling Right: Independent with assistive device Rolling Left: Independent with assistive device Supine to Sit: Independent with assistive device Sit to Supine: Independent with assistive device Transfers Sit to Stand: Minimal Assistance - Patient > 75% Stand to Sit: Supervision/Verbal cueing  Trunk/Postural Assessment  Cervical Assessment Cervical Assessment:  Exceptions to [facility] (forward head) Thoracic Assessment Thoracic Assessment: Exceptions to [facility] (rounded shoulders) Lumbar Assessment Lumbar Assessment: Exceptions to [facility] (posterior pelvic tilt) Postural Control Postural Control: Deficits on evaluation Trunk Control: posterior lean in sitting but able to self correct Protective Responses: delayed and inadequate - improved since eval  Balance Balance Balance Assessed: Yes Static Sitting Balance Static Sitting - Balance Support: Feet supported;Bilateral upper extremity supported Static Sitting - Level of Assistance: 7: Independent Dynamic Sitting Balance Dynamic Sitting - Balance Support: No upper extremity supported;Feet supported Dynamic Sitting - Level of Assistance: 6: Modified independent (Device/Increase time) Static Standing Balance Static Standing - Balance Support: Bilateral upper extremity supported;During functional activity (RW) Static Standing - Level of Assistance: 5: Stand by assistance (CGA) Dynamic Standing Balance Dynamic Standing - Balance Support: Bilateral upper extremity supported;During functional activity (RW) Dynamic Standing - Level of Assistance: 4: Min assist Dynamic Standing - Comments: gait Extremity/Trunk Assessment RUE Assessment RUE Assessment: Within Functional Limits Active Range of Motion (AROM) Comments: Limited to 100 degrees shoulder flexion, WFL distally General Strength Comments: 4 LUE Assessment LUE Assessment: Within Functional  Limits       Kynzleigh Bandel E Berton Butrick, MS, OTR/L  08/22/2023, 12:14 PM

## 2023-08-22 NOTE — Progress Notes (Signed)
Inpatient Rehabilitation Discharge Medication Review by a Pharmacist  A complete drug regimen review was completed for this patient to identify any potential clinically significant medication issues.  High Risk Drug Classes Is patient taking? Indication by Medication  Antipsychotic No   Anticoagulant No   Antibiotic Yes Tramadol- acute pain  Opioid No   Antiplatelet Yes Aspirin 81 mg - CAD  Hypoglycemics/insulin Yes insulin, Metformin, trulicity - Type 2 DM  Vasoactive Medication No   Chemotherapy No   Other Yes Flomax, Proscar- BPH Viagra- ED Vibegron, Detrol LA - overactive bladder famotidine - GI prophylaxis Rosuvasatin - hyperlipidemia Flexeril- muscle spasms     Type of Medication Issue Identified Description of Issue Recommendation(s)  Drug Interaction(s) (clinically significant)     Duplicate Therapy     Allergy     No Medication Administration End Date     Incorrect Dose     Additional Drug Therapy Needed     Significant med changes from prior encounter (inform family/care partners about these prior to discharge).    Other       Clinically significant medication issues were identified that warrant physician communication and completion of prescribed/recommended actions by midnight of the next day:  No    Time spent performing this drug regimen review (minutes): 45  Jaquanda Wickersham BS, PharmD, BCPS Clinical Pharmacist 08/22/2023 3:16 PM  Contact: [phone] after 3 PM  "Be curious, not judgmental..." -Debbora Dus

## 2023-08-22 NOTE — Progress Notes (Signed)
 Patient ID: Adam Ray, male   DOB: [birth year], 68 y.o.   MRN: [id number]  SW checked [facility] portal,and appeal was denied as in agreement with pt to discharge to home.   1155- SW called [facility] to confirm if appeal stands. Confirms appeal was denied.   1159- SW spoke with pt wife to inform on above. Reports she will be arriving to the hospital shortly. SW shared SW will make arrangements to arrange for transportation to home today as the vendor does not have transportation over the weekend.  SW met with pt in room to discuss above. He does not want to use [facility] [employer], and would prefer to use [facility]. SW will confirm if transportation can be arranged. Pt is aware per [facility] and hospital appeal process, that he is no longer covered after 12pm tomorrow. SW provided pt with HINN 12 in which he refused to sign.  1231-SW spoke with [facility] and arranged for pick up tomorrow. Pick up is tomorrow 10:45am-11:15am.   SW later met with pt and pt wife while Marlis Edelson was present to review discharge.   SW updated [facility] on pt appeal denied and discharging tomorrow.  Cecile Sheerer, MSW, LCSW Office: [phone] Cell: [phone] Fax: [phone]

## 2023-08-22 NOTE — Progress Notes (Signed)
PROGRESS NOTE   Subjective/Complaints: Patient's appeal was denied. Discussed with patient, wife, SW plan for d/c home tomorrow Voltaren gel ordered for left sided knee pain   ROS: Patient denies fever, rash, sore throat, blurred vision, dizziness, nausea, vomiting, diarrhea, cough, shortness of breath or chest pain,  headache, or mood change.  +bleeding from residual limb- resolved +muscle tightness in residual limb-ongoing + Vision changes +left sided knee pain  Objective:   No results found.  No results for input(s): "WBC", "HGB", "HCT", "PLT" in the last 72 hours.    No results for input(s): "NA", "K", "CL", "CO2", "GLUCOSE", "BUN", "CREATININE", "CALCIUM" in the last 72 hours.     Intake/Output Summary (Last 24 hours) at 08/22/2023 1421 Last data filed at 08/22/2023 1335 Gross per 24 hour  Intake 817 ml  Output 1725 ml  Net -908 ml        Physical Exam: Vital Signs Blood pressure 113/65, pulse 72, temperature 98.1 F (36.7 C), resp. rate 17, height 5\' 10"  (1.778 m), weight 65.4 kg, SpO2 98%. Constitutional: No distress . Vital signs reviewed.  Laying in bed. HEENT: NCAT, EOMI.  Unable to complete visual field testing throughout all fields in right eye, lateral fields and left eye.  Does blink to threat in all 4 quadrants.  Pupils constricted but equal and reactive.  Neck: supple Cardiovascular: RRR without murmur. No JVD    Respiratory/Chest: CTA Bilaterally without wheezes or rales. Normal effort    GI/Abdomen: BS +, non-tender, non-distended Ext: no clubbing, cyanosis; 2+ edema left lower extremity Psych: pleasant and cooperative  MSK: Bilateral BKA's  Skin:  2-15: Appears some increased necrotic tissue along incisional line compared to last photograph, with a few areas of bleeding/adherent to shrinker sock.  No obvious discharge or dehiscence with palpation.  2+ edema throughout distal  limb.  Ischemic changes 2/20, bleeding has stopped       Neurological:     General: No focal deficit present.     Mental Status: He is alert and oriented to person, place, and time. Seems to be cognitively appropriate on a basic level--some mild delays. No focal weakness. --stable 2/21     Assessment/Plan: 1. Functional deficits which require 3+ hours per day of interdisciplinary therapy in a comprehensive inpatient rehab setting. Physiatrist is providing close team supervision and 24 hour management of active medical problems listed below. Physiatrist and rehab team continue to assess barriers to discharge/monitor patient progress toward functional and medical goals  Care Tool:  Bathing    Body parts bathed by patient: Right arm, Left arm, Chest, Abdomen, Front perineal area, Right upper leg, Buttocks, Left upper leg, Face   Body parts bathed by helper: Front perineal area, Buttocks, Right upper leg, Left upper leg, Face Body parts n/a: Right lower leg, Left lower leg   Bathing assist Assist Level: Set up assist     Upper Body Dressing/Undressing Upper body dressing   What is the patient wearing?: Pull over shirt    Upper body assist Assist Level: Set up assist    Lower Body Dressing/Undressing Lower body dressing      What is the patient wearing?: Incontinence brief, Pants  Lower body assist Assist for lower body dressing: Minimal Assistance - Patient > 75%     Toileting Toileting    Toileting assist Assist for toileting: Minimal Assistance - Patient > 75%     Transfers Chair/bed transfer  Transfers assist  Chair/bed transfer activity did not occur: Safety/medical concerns (unsafet to get up)  Chair/bed transfer assist level: Supervision/Verbal cueing (lateral scoot)     Locomotion Ambulation   Ambulation assist   Ambulation activity did not occur: Safety/medical concerns (pain, decreased balance on R prosthetic)  Assist level: Minimal  Assistance - Patient > 75% Assistive device: Walker-rolling Max distance: 36ft   Walk 10 feet activity   Assist  Walk 10 feet activity did not occur: Safety/medical concerns (pain, decreased balance on R prosthetic)  Assist level: Minimal Assistance - Patient > 75% Assistive device: Walker-rolling   Walk 50 feet activity   Assist Walk 50 feet with 2 turns activity did not occur: Safety/medical concerns (fatigue, decreased balance/coordination, pain)         Walk 150 feet activity   Assist Walk 150 feet activity did not occur: Safety/medical concerns (fatigue, decreased balance/coordination, pain)         Walk 10 feet on uneven surface  activity   Assist Walk 10 feet on uneven surfaces activity did not occur: Safety/medical concerns (fatigue, decreased balance/coordination, pain)         Wheelchair     Assist Is the patient using a wheelchair?: Yes Type of Wheelchair:  Armed forces technical officer)    Wheelchair assist level: Independent Max wheelchair distance: 136ft    Wheelchair 50 feet with 2 turns activity    Assist        Assist Level: Independent   Wheelchair 150 feet activity     Assist      Assist Level: Independent   Blood pressure 113/65, pulse 72, temperature 98.1 F (36.7 C), resp. rate 17, height 5\' 10"  (1.778 m), weight 65.4 kg, SpO2 98%.  Medical Problem List and Plan: 1. 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             -ELOS/Goals: 10-12 days at w/c level  IPOC completed   Continues to be stable for discharge, discharge order placed, but patient feels unsafe discharging, discussed he has right to appeal discharge  Would benefit from hospital bed for safety  Discussed plan for d/c tomorrow  2. Bleeding from residual limb incision: resolved, lovenox d/ced, continue aspirin   3. Pain Management: Tylenol, oxycodone d/ced  due to codeine allergy, tramadol ordered prn, continue, voltaren gel ordered for left knee pain  4. Hallucinations: discussed with wife that these are a side effect to codeine, oxycodone d/ced    5. Neuropsych/cognition: currently, patient is not  capable of making decisions on his  own behalf.   6. Bleeding from incision: Hgb ordered and improved, resolved  7. Fluids/Electrolytes/Nutrition: Routine Is and Os and follow-up chemistries             -continue vit C, zinc supplements   8: Hypertension: monitor TID and prn            D/c toprol XL, d/c finasteride   9: Hyperlipidemia: continue statin   10: ? OAB/? 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 Lajoyce Corners  -new shrinker was placed, continue  13: DM: CBGs QID, A1c = 10%                   -increase metformin to 1,000mg  BID  -provided list of foods for diabetes   CBG (last 3)  Recent Labs    08/21/23 1634 08/21/23 2109 08/22/23 0606  GLUCAP 163* 185* 143*    Continue semglee to 23U  Increase metformin to 850mg  BID  D/c aspart with meals  14: History of bicuspid aortic valve, moderate AV stenosis   15: History of CAD s/p LHC with PCI/stent on 07/12/2018             -completed DAPT for 12 months; continue statin             -follows with Dr. Allyson Sabal   16: Pancytopenia: follows with Dr. Mosetta Putt and gets B12 injection monthly             -follow-up CBC   17. Constipation-    -add senna to colace, schedule miralax  -Having regular bowel movements  18. Confusion: resolved with cessation of oxycodone  -Stable on tramadol  19, Hypotension: d/c toprol-XL  20. Fatigue: B12 ordered IM on 2/5, conitnue metanx  21. Polypharmacy: advised wife that we will provide her with a list of patient's medications  22. Hematoma: lovenox d/ced, continue aspirin, discussed with ortho and team ischemic changes to limb, d/c advil  23. Impaired  mobility: vas [place] ordered, reviewed and is negative for clots, discussed with patient  24.  Vision deficit.  Encourage patient to bring glasses from home.  Seems worse in right eye, but responsive to blink to threat in all fields  LOS: 17 days A FACE TO FACE EVALUATION WAS PERFORMED  Clint Bolder P Hisako Bugh 08/22/2023, 2:21 PM

## 2023-08-22 NOTE — Progress Notes (Signed)
Physical Therapy Discharge Summary  Patient Details  Name: Adam Ray MRN: [id number] Date of Birth: 1956-05-27  Date of Discharge from PT service:August 22, 2023  Patient has met 8 of 9 long term goals due to improved activity tolerance, improved balance, improved postural control, increased strength, increased range of motion, decreased pain, ability to compensate for deficits, improved awareness, and improved coordination. Patient to discharge at a wheelchair level Supervision. Patient's care partner is independent to provide the necessary physical assistance at discharge. Pt's wife attended family education training and verbalized and demonstrated confidence with tasks to ensure safe discharge home.   Reasons goals not met: pt did not meet sit<>stand goal of CGA as pt currently requires min A to stand due to global weakness/deconditioning  Recommendation:  Patient will benefit from ongoing skilled PT services in home health setting to continue to advance safe functional mobility, address ongoing impairments in transfers, generalized strengthening and endurance, dynamic standing balance/coordination, limb care, gait training, and tominimize fall risk.  Equipment: No equipment provided - pt already has electric WC at home and plans on getting lightweight WC from [place] upon discharge  Reasons for discharge: treatment goals met and discharge from hospital  Patient/family agrees with progress made and goals achieved: Yes  PT Discharge Precautions/Restrictions Precautions Precautions: Fall Required Braces or Orthoses: Other Brace Other Brace: LLE Limb Guard Restrictions Weight Bearing Restrictions Per Provider Order: Yes LLE Weight Bearing Per Provider Order: Non weight bearing Pain Interference Pain Interference Pain Effect on Sleep: 2. Occasionally Pain Interference with Therapy Activities: 0. Does not apply - I have not received rehabilitationtherapy in the past 5 days Pain  Interference with Day-to-Day Activities: 1. Rarely or not at all Cognition Overall Cognitive Status: Within Functional Limits for tasks assessed Arousal/Alertness: Awake/alert Orientation Level: Oriented X4 Memory: Appears intact Awareness: Impaired Problem Solving: Impaired Safety/Judgment: Impaired Comments: decreased insight into deficits Sensation Sensation Light Touch: Impaired Detail Hot/Cold: Not tested Proprioception: Impaired by gross assessment Stereognosis: Not tested Additional Comments: getting L and R confused; absent sensation along distal aspect of R residual limb. 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, global weakness/deconditioning, decreased standing balance/coordination, and pain  Mobility Bed Mobility Bed Mobility: Rolling Right;Rolling Left;Sit to Supine;Supine to Sit Rolling Right: Independent with assistive device Rolling Left: Independent with assistive device Supine to Sit: Independent with assistive device Sit to Supine: Independent with assistive device Transfers Transfers: Sit to Stand;Stand to Sit;Lateral/Scoot Transfers Sit to Stand: Minimal Assistance - Patient > 75% Stand to Sit: Supervision/Verbal cueing Lateral/Scoot Transfers: Supervision/Verbal cueing Transfer (Assistive device): Rolling walker Locomotion  Gait Ambulation: Yes Gait Assistance: Minimal Assistance - Patient > 75% Gait Distance (Feet): 20 Feet Assistive device: Rolling walker Gait Assistance Details: Verbal cues for gait  pattern;Verbal cues for sequencing;Verbal cues for technique;Verbal cues for safe use of DME/AE Gait Assistance Details: verbal cues for appropriate step length and sequencing with RW Gait Gait: Yes Gait Pattern: Impaired ("hop to") Gait velocity: decreased Stairs / Additional Locomotion Stairs: No Pick up small object from the floor assist level: Dependent - Patient 0% Wheelchair Mobility Wheelchair Mobility: Yes Wheelchair Assistance: [employer] fast streamer: Both upper  extremities Wheelchair Parts Management: Needs assistance Distance: 133ft  Trunk/Postural Assessment  Cervical Assessment Cervical Assessment: Exceptions to [facility] (forward head) Thoracic Assessment Thoracic Assessment: Exceptions to [facility] (rounded shoulders) Lumbar Assessment Lumbar Assessment: Exceptions to [facility] (posterior pelvic tilt) Postural Control Postural Control: Deficits on evaluation Trunk Control: posterior lean in sitting but able to self correct Protective Responses: delayed and inadequate - improved since eval  Balance Balance Balance Assessed: Yes Static Sitting Balance Static Sitting - Balance Support: Feet supported;Bilateral upper extremity supported Static Sitting - Level of Assistance: 7: Independent Dynamic Sitting Balance Dynamic Sitting - Balance Support: No upper extremity supported;Feet supported Dynamic Sitting - Level of Assistance: 6: Modified independent (Device/Increase time) Static Standing Balance Static Standing - Balance Support: Bilateral upper extremity supported;During functional activity (RW) Static Standing - Level of Assistance: 5: Stand by assistance (CGA) Dynamic Standing Balance Dynamic Standing - Balance Support: Bilateral upper extremity supported;During functional activity (RW) Dynamic Standing - Level of Assistance: 4: Min assist Dynamic Standing - Comments: gait Extremity Assessment  RLE Assessment RLE Assessment: Not tested General Strength  Comments: grossly 3+/5 LLE Assessment LLE Assessment: Not tested General Strength Comments: grossly 3+/5  Marlana Salvage Zaunegger Blima Rich PT, DPT 08/22/2023, 12:17 PM

## 2023-08-22 NOTE — Progress Notes (Signed)
Physical Therapy Session Note  Patient Details  Name: Adam Ray MRN: [id number] Date of Birth: Jan 03, 1956  Today's Date: 08/22/2023 PT Individual Time: 7829-5621 PT Individual Time Calculation (min): 87 min   Short Term Goals: Week 1:  PT Short Term Goal 1 (Week 1): pt will perform bed mobility with min A PT Short Term Goal 1 - Progress (Week 1): Met PT Short Term Goal 2 (Week 1): pt will transfer bed<>chair with LRAD and mod A PT Short Term Goal 2 - Progress (Week 1): Met PT Short Term Goal 3 (Week 1): pt will transfer sit<>stand with LRAD and mod A PT Short Term Goal 3 - Progress (Week 1): Met Week 2:  PT Short Term Goal 1 (Week 2): STG=LTG due to LOS  Skilled Therapeutic Interventions/Progress Updates:   Received pt sitting in WC, pt agreeable to PT treatment, and reported pain 8/10 in L knee - RN notified. Session with emphasis on functional mobility/transfers, generalized strengthening and endurance, dynamic standing balance/coordination, WC mobility, and gait training.   Rinsed mouth at sink with set up assist, then performed WC mobility 182ft using BUE and supervision with emphasis on BUE strength. WC with tendency to veer to R and pt with poor and inefficient stride. Transported remainder of way to ortho gym in [facility] dependently. Stood from [facility] with RW and mod A and transferred onto Nustep with RW and mod A (difficulty backing up to seat and controlling prosthetic). Pt performed seated BUE and RLE strengthening on Nustep at workload 3 for 15 minutes for a total of 387 steps with emphasis on cardiovascular endurance. Pt required 3 seated rest breaks due to fatigue and therapist switched out current WC for different one with better alignment.  Required multiple attempts and mod A to stand from Nustep and ambulated 44ft with RW and mod A to mat. Pt with increased difficulty turning and positioning RW; required cues to position himself inside center of RW. Stood from [facility] with RW and mod A x  2 trials and worked on Airline pilot with RUE x 2 trials with min A for balance. Transferred into WC via stand<>pivot with RW and min A (again, difficulty stepping backwards). Performed WC mobility >359ft using BUE and supervision back to room. PA, Pam, arrived to inspect dressing. Concluded session with pt sitting in WC, needs within reach, and seatbelt alarm on awaiting upcoming OT session.   Therapy Documentation Precautions:  Precautions Precautions: Fall Precaution/Restrictions Comments: LLE Wound Vac Required Braces or Orthoses: Other Brace Other Brace: LLE Limb Guard Restrictions Weight Bearing Restrictions Per Provider Order: Yes LLE Weight Bearing Per Provider Order: Weight bearing as tolerated  Therapy/Group: Individual Therapy Marlana Salvage Zaunegger Blima Rich PT, DPT 08/22/2023, 6:43 AM

## 2023-08-23 DIAGNOSIS — I959 Hypotension, unspecified: Secondary | ICD-10-CM

## 2023-08-23 LAB — GLUCOSE, CAPILLARY: Glucose-Capillary: 113 mg/dL — ABNORMAL HIGH (ref 70–99)

## 2023-08-23 NOTE — Progress Notes (Signed)
 PROGRESS NOTE   Subjective/Complaints:  Pt doing well, hesitant to go home but ok with it. Slept well. Pain manageable. LBM yesterday per pt. Urinating fine per pt. Denies any other complaints or concerns.    ROS: as per HPI. Denies CP, SOB, abd pain, N/V/D/C, or any other complaints at this time.    +bleeding from residual limb- resolved +muscle tightness in residual limb-ongoing + Vision changes +left sided knee pain  Objective:   No results found.  No results for input(s): "WBC", "HGB", "HCT", "PLT" in the last 72 hours.    No results for input(s): "NA", "K", "CL", "CO2", "GLUCOSE", "BUN", "CREATININE", "CALCIUM" in the last 72 hours.     Intake/Output Summary (Last 24 hours) at 08/23/2023 0804 Last data filed at 08/22/2023 1806 Gross per 24 hour  Intake 358 ml  Output --  Net 358 ml        Physical Exam: Vital Signs Blood pressure 115/63, pulse 72, temperature 98.2 F (36.8 C), temperature source Oral, resp. rate 18, height 5\' 10"  (1.778 m), weight 65.4 kg, SpO2 97%.  Constitutional: No distress . Vital signs reviewed.  Laying in bed. HEENT: NCAT, EOMI.  Unable to complete visual field testing throughout all fields in right eye, lateral fields and left eye.  Does blink to threat in all 4 quadrants.  Pupils constricted but equal and reactive.--not reassessed in detail 2/22  Neck: supple Cardiovascular: RRR without murmur. No JVD    Respiratory/Chest: CTA Bilaterally without wheezes or rales. Normal effort    GI/Abdomen: BS +, non-tender, non-distended Ext: no clubbing, cyanosis; 2+ edema left lower extremity stump Psych: pleasant and cooperative  MSK: Bilateral BKA's  PRIOR EXAMS: Skin:  2-15: Appears some increased necrotic tissue along incisional line compared to last photograph, with a few areas of bleeding/adherent to shrinker sock.  No obvious discharge or dehiscence with palpation.  2+ edema  throughout distal limb.  Ischemic changes 2/20, bleeding has stopped       Neurological:     General: No focal deficit present.     Mental Status: He is alert and oriented to person, place, and time. Seems to be cognitively appropriate on a basic level--some mild delays. No focal weakness. --stable 2/21     Assessment/Plan: 1. Functional deficits which require 3+ hours per day of interdisciplinary therapy in a comprehensive inpatient rehab setting. Physiatrist is providing close team supervision and 24 hour management of active medical problems listed below. Physiatrist and rehab team continue to assess barriers to discharge/monitor patient progress toward functional and medical goals  Care Tool:  Bathing    Body parts bathed by patient: Right arm, Left arm, Chest, Abdomen, Front perineal area, Right upper leg, Buttocks, Left upper leg, Face   Body parts bathed by helper: Front perineal area, Buttocks, Right upper leg, Left upper leg, Face Body parts n/a: Right lower leg, Left lower leg   Bathing assist Assist Level: Set up assist     Upper Body Dressing/Undressing Upper body dressing   What is the patient wearing?: Pull over shirt    Upper body assist Assist Level: Set up assist    Lower Body Dressing/Undressing Lower body dressing  What is the patient wearing?: Incontinence brief, Pants     Lower body assist Assist for lower body dressing: Minimal Assistance - Patient > 75%     Toileting Toileting    Toileting assist Assist for toileting: Minimal Assistance - Patient > 75%     Transfers Chair/bed transfer  Transfers assist  Chair/bed transfer activity did not occur: Safety/medical concerns (unsafet to get up)  Chair/bed transfer assist level: Supervision/Verbal cueing (lateral scoot)     Locomotion Ambulation   Ambulation assist   Ambulation activity did not occur: Safety/medical concerns (pain, decreased balance on R prosthetic)  Assist  level: Minimal Assistance - Patient > 75% Assistive device: Walker-rolling Max distance: 17ft   Walk 10 feet activity   Assist  Walk 10 feet activity did not occur: Safety/medical concerns (pain, decreased balance on R prosthetic)  Assist level: Minimal Assistance - Patient > 75% Assistive device: Walker-rolling   Walk 50 feet activity   Assist Walk 50 feet with 2 turns activity did not occur: Safety/medical concerns (fatigue, decreased balance/coordination, pain)         Walk 150 feet activity   Assist Walk 150 feet activity did not occur: Safety/medical concerns (fatigue, decreased balance/coordination, pain)         Walk 10 feet on uneven surface  activity   Assist Walk 10 feet on uneven surfaces activity did not occur: Safety/medical concerns (fatigue, decreased balance/coordination, pain)         Wheelchair     Assist Is the patient using a wheelchair?: Yes Type of Wheelchair:  Armed forces technical officer)    Wheelchair assist level: Independent Max wheelchair distance: 133ft    Wheelchair 50 feet with 2 turns activity    Assist        Assist Level: Independent   Wheelchair 150 feet activity     Assist      Assist Level: Independent   Blood pressure 115/63, pulse 72, temperature 98.2 F (36.8 C), temperature source Oral, resp. rate 18, height 5\' 10"  (1.778 m), weight 65.4 kg, SpO2 97%.  Medical Problem List and Plan: 1. 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             -ELOS/Goals: 10-12 days at w/c level  IPOC completed   Continues to be stable for discharge, discharge order placed, but patient feels unsafe discharging, discussed he has right to appeal discharge  Would benefit from hospital bed for safety  Discussed plan for d/c tomorrow  -08/23/23 d/c home today, discussed d/c instructions and meds  2. Bleeding from residual  limb incision: resolved, lovenox d/ced, continue aspirin   3. Pain Management: Tylenol, oxycodone d/ced due to codeine allergy, tramadol ordered prn, continue, voltaren gel ordered for left knee pain  4. Hallucinations: discussed with wife that these are a side effect to codeine, oxycodone d/ced    5. Neuropsych/cognition: currently, patient is not  capable of making decisions on his  own behalf.   6. Bleeding from incision: Hgb ordered and improved, resolved  7. Fluids/Electrolytes/Nutrition: Routine Is and Os and follow-up chemistries             -continue vit C, zinc supplements   8: Hypertension: monitor TID and prn            D/c toprol XL, d/c finasteride  9: Hyperlipidemia: continue statin   10: ? OAB/? 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 Lajoyce Corners  -new shrinker was placed, continue  13: DM: CBGs QID, A1c = 10%                   -increase metformin to 1,000mg  BID  -provided list of foods for diabetes   CBG (last 3)  Recent Labs    08/22/23 0606 08/22/23 2150 08/23/23 0550  GLUCAP 143* 192* 113*    Continue semglee to 23U  Increase metformin to 850mg  BID  D/c aspart with meals  -08/23/23 CBGs decent, cont home regimen  14: History of bicuspid aortic valve, moderate AV stenosis   15: History of CAD s/p LHC with PCI/stent on 07/12/2018             -completed DAPT for 12 months; continue statin             -follows with Dr. Allyson Sabal   16: Pancytopenia: follows with Dr. Mosetta Putt and gets B12 injection monthly             -follow-up CBC   17. Constipation-    -add senna to colace, schedule miralax  -Having regular bowel movements  -08/23/23 LBM yesterday per pt, cont regimen outpatient  18. Confusion: resolved with cessation of oxycodone  -Stable on tramadol  19, Hypotension: d/c toprol-XL  -08/23/23 BPs fine, monitor outpatient Vitals:   08/19/23 1324 08/19/23  2011 08/20/23 0520 08/20/23 1325  BP: 118/68 113/63 112/67 104/62   08/20/23 2004 08/21/23 0431 08/21/23 1318 08/21/23 2014  BP: 110/62 (!) 111/57 134/76 114/61   08/22/23 0609 08/22/23 1704 08/22/23 1935 08/23/23 0335  BP: 113/65 123/65 (!) 108/58 115/63     20. Fatigue: B12 ordered IM on 2/5, conitnue metanx  21. Polypharmacy: advised wife that we will provide her with a list of patient's medications  22. Hematoma: lovenox d/ced, continue aspirin, discussed with ortho and team ischemic changes to limb, d/c advil  23. Impaired mobility: vas [place] ordered, reviewed and is negative for clots, discussed with patient  24.  Vision deficit.  Encourage patient to bring glasses from home.  Seems worse in right eye, but responsive to blink to threat in all fields  LOS: 18 days A FACE TO [facility] EVALUATION WAS PERFORMED  [address] 08/23/2023, 8:04 AM

## 2023-08-23 NOTE — Progress Notes (Signed)
 From night report dressing changed today. Serosanguinous draining observed with left BKA on left lateral side diameter size approximately the size of quarter. Edema + 2 left leg above BKA. Shrinker on patient at this time. Denies any pain. Not wanting to elevate leg or ice at this time.

## 2023-08-23 NOTE — Progress Notes (Signed)
 Contacted transportation services to verify time and ensure ride is still scheduled to pick patient up. Spoke to [place] on phone and patients' reservation for pick up is still scheduled. Informed Nurse Tech about having patient downstairs at 1045 as ride will be at Entrance A of the hospital at that time. Pt having no further questions or concerns to report. Voided prior to discharge. Belongings sent home with patient.

## 2023-08-25 ENCOUNTER — Other Ambulatory Visit: Payer: Self-pay

## 2023-08-25 ENCOUNTER — Encounter: Payer: Self-pay | Admitting: Hematology

## 2023-08-25 ENCOUNTER — Emergency Department (HOSPITAL_COMMUNITY): Payer: No Typology Code available for payment source

## 2023-08-25 ENCOUNTER — Inpatient Hospital Stay (HOSPITAL_COMMUNITY)
Admission: EM | Admit: 2023-08-25 | Discharge: 2023-09-02 | DRG: 464 | Disposition: A | Discharge status: Skilled Nursing Facility | Payer: No Typology Code available for payment source | Attending: Family Medicine | Admitting: Family Medicine

## 2023-08-25 ENCOUNTER — Encounter (HOSPITAL_COMMUNITY): Payer: Self-pay

## 2023-08-25 DIAGNOSIS — Z794 Long term (current) use of insulin: Secondary | ICD-10-CM

## 2023-08-25 DIAGNOSIS — Z5986 Financial insecurity: Secondary | ICD-10-CM

## 2023-08-25 DIAGNOSIS — Z87891 Personal history of nicotine dependence: Secondary | ICD-10-CM

## 2023-08-25 DIAGNOSIS — M6281 Muscle weakness (generalized): Secondary | ICD-10-CM | POA: Diagnosis not present

## 2023-08-25 DIAGNOSIS — D573 Sickle-cell trait: Secondary | ICD-10-CM | POA: Diagnosis present

## 2023-08-25 DIAGNOSIS — D62 Acute posthemorrhagic anemia: Secondary | ICD-10-CM | POA: Diagnosis not present

## 2023-08-25 DIAGNOSIS — E1122 Type 2 diabetes mellitus with diabetic chronic kidney disease: Secondary | ICD-10-CM | POA: Diagnosis present

## 2023-08-25 DIAGNOSIS — N3281 Overactive bladder: Secondary | ICD-10-CM | POA: Diagnosis not present

## 2023-08-25 DIAGNOSIS — I130 Hypertensive heart and chronic kidney disease with heart failure and stage 1 through stage 4 chronic kidney disease, or unspecified chronic kidney disease: Secondary | ICD-10-CM | POA: Diagnosis present

## 2023-08-25 DIAGNOSIS — E782 Mixed hyperlipidemia: Secondary | ICD-10-CM | POA: Diagnosis not present

## 2023-08-25 DIAGNOSIS — E119 Type 2 diabetes mellitus without complications: Secondary | ICD-10-CM

## 2023-08-25 DIAGNOSIS — Z8249 Family history of ischemic heart disease and other diseases of the circulatory system: Secondary | ICD-10-CM | POA: Diagnosis not present

## 2023-08-25 DIAGNOSIS — N182 Chronic kidney disease, stage 2 (mild): Secondary | ICD-10-CM | POA: Diagnosis present

## 2023-08-25 DIAGNOSIS — G8918 Other acute postprocedural pain: Secondary | ICD-10-CM | POA: Diagnosis not present

## 2023-08-25 DIAGNOSIS — N400 Enlarged prostate without lower urinary tract symptoms: Secondary | ICD-10-CM | POA: Diagnosis present

## 2023-08-25 DIAGNOSIS — S88112D Complete traumatic amputation at level between knee and ankle, left lower leg, subsequent encounter: Secondary | ICD-10-CM | POA: Diagnosis not present

## 2023-08-25 DIAGNOSIS — E1169 Type 2 diabetes mellitus with other specified complication: Secondary | ICD-10-CM | POA: Diagnosis not present

## 2023-08-25 DIAGNOSIS — K219 Gastro-esophageal reflux disease without esophagitis: Secondary | ICD-10-CM | POA: Diagnosis not present

## 2023-08-25 DIAGNOSIS — Z7984 Long term (current) use of oral hypoglycemic drugs: Secondary | ICD-10-CM

## 2023-08-25 DIAGNOSIS — Y835 Amputation of limb(s) as the cause of abnormal reaction of the patient, or of later complication, without mention of misadventure at the time of the procedure: Secondary | ICD-10-CM | POA: Diagnosis present

## 2023-08-25 DIAGNOSIS — I110 Hypertensive heart disease with heart failure: Secondary | ICD-10-CM | POA: Diagnosis not present

## 2023-08-25 DIAGNOSIS — Z833 Family history of diabetes mellitus: Secondary | ICD-10-CM

## 2023-08-25 DIAGNOSIS — R58 Hemorrhage, not elsewhere classified: Secondary | ICD-10-CM | POA: Diagnosis not present

## 2023-08-25 DIAGNOSIS — S88112A Complete traumatic amputation at level between knee and ankle, left lower leg, initial encounter: Secondary | ICD-10-CM

## 2023-08-25 DIAGNOSIS — E785 Hyperlipidemia, unspecified: Secondary | ICD-10-CM | POA: Diagnosis present

## 2023-08-25 DIAGNOSIS — S81802A Unspecified open wound, left lower leg, initial encounter: Secondary | ICD-10-CM | POA: Diagnosis present

## 2023-08-25 DIAGNOSIS — J449 Chronic obstructive pulmonary disease, unspecified: Secondary | ICD-10-CM | POA: Diagnosis not present

## 2023-08-25 DIAGNOSIS — W1830XA Fall on same level, unspecified, initial encounter: Secondary | ICD-10-CM | POA: Diagnosis present

## 2023-08-25 DIAGNOSIS — I97621 Postprocedural hematoma of a circulatory system organ or structure following other procedure: Secondary | ICD-10-CM | POA: Diagnosis present

## 2023-08-25 DIAGNOSIS — I2510 Atherosclerotic heart disease of native coronary artery without angina pectoris: Secondary | ICD-10-CM | POA: Diagnosis present

## 2023-08-25 DIAGNOSIS — Z823 Family history of stroke: Secondary | ICD-10-CM

## 2023-08-25 DIAGNOSIS — Z4789 Encounter for other orthopedic aftercare: Secondary | ICD-10-CM | POA: Diagnosis not present

## 2023-08-25 DIAGNOSIS — E11618 Type 2 diabetes mellitus with other diabetic arthropathy: Secondary | ICD-10-CM | POA: Diagnosis not present

## 2023-08-25 DIAGNOSIS — I959 Hypotension, unspecified: Secondary | ICD-10-CM | POA: Diagnosis present

## 2023-08-25 DIAGNOSIS — E1165 Type 2 diabetes mellitus with hyperglycemia: Secondary | ICD-10-CM | POA: Diagnosis present

## 2023-08-25 DIAGNOSIS — I447 Left bundle-branch block, unspecified: Secondary | ICD-10-CM | POA: Diagnosis not present

## 2023-08-25 DIAGNOSIS — Z884 Allergy status to anesthetic agent status: Secondary | ICD-10-CM

## 2023-08-25 DIAGNOSIS — T8781 Dehiscence of amputation stump: Secondary | ICD-10-CM | POA: Diagnosis not present

## 2023-08-25 DIAGNOSIS — S2242XA Multiple fractures of ribs, left side, initial encounter for closed fracture: Secondary | ICD-10-CM | POA: Diagnosis not present

## 2023-08-25 DIAGNOSIS — R600 Localized edema: Secondary | ICD-10-CM | POA: Diagnosis not present

## 2023-08-25 DIAGNOSIS — E1151 Type 2 diabetes mellitus with diabetic peripheral angiopathy without gangrene: Secondary | ICD-10-CM | POA: Diagnosis present

## 2023-08-25 DIAGNOSIS — E113591 Type 2 diabetes mellitus with proliferative diabetic retinopathy without macular edema, right eye: Secondary | ICD-10-CM | POA: Diagnosis not present

## 2023-08-25 DIAGNOSIS — I509 Heart failure, unspecified: Secondary | ICD-10-CM | POA: Diagnosis not present

## 2023-08-25 DIAGNOSIS — Z7985 Long-term (current) use of injectable non-insulin antidiabetic drugs: Secondary | ICD-10-CM

## 2023-08-25 DIAGNOSIS — Z89512 Acquired absence of left leg below knee: Secondary | ICD-10-CM | POA: Diagnosis not present

## 2023-08-25 DIAGNOSIS — E1159 Type 2 diabetes mellitus with other circulatory complications: Secondary | ICD-10-CM | POA: Diagnosis not present

## 2023-08-25 DIAGNOSIS — R2689 Other abnormalities of gait and mobility: Secondary | ICD-10-CM | POA: Diagnosis not present

## 2023-08-25 DIAGNOSIS — Z5982 Transportation insecurity: Secondary | ICD-10-CM

## 2023-08-25 DIAGNOSIS — R531 Weakness: Secondary | ICD-10-CM | POA: Diagnosis not present

## 2023-08-25 DIAGNOSIS — R61 Generalized hyperhidrosis: Secondary | ICD-10-CM | POA: Diagnosis not present

## 2023-08-25 DIAGNOSIS — I5032 Chronic diastolic (congestive) heart failure: Secondary | ICD-10-CM | POA: Diagnosis present

## 2023-08-25 DIAGNOSIS — Z7401 Bed confinement status: Secondary | ICD-10-CM | POA: Diagnosis not present

## 2023-08-25 DIAGNOSIS — Z7982 Long term (current) use of aspirin: Secondary | ICD-10-CM

## 2023-08-25 DIAGNOSIS — Z885 Allergy status to narcotic agent status: Secondary | ICD-10-CM

## 2023-08-25 DIAGNOSIS — Z91040 Latex allergy status: Secondary | ICD-10-CM | POA: Diagnosis not present

## 2023-08-25 DIAGNOSIS — Z0389 Encounter for observation for other suspected diseases and conditions ruled out: Secondary | ICD-10-CM | POA: Diagnosis not present

## 2023-08-25 DIAGNOSIS — I152 Hypertension secondary to endocrine disorders: Secondary | ICD-10-CM | POA: Diagnosis not present

## 2023-08-25 DIAGNOSIS — W19XXXA Unspecified fall, initial encounter: Secondary | ICD-10-CM | POA: Diagnosis not present

## 2023-08-25 DIAGNOSIS — N139 Obstructive and reflux uropathy, unspecified: Secondary | ICD-10-CM | POA: Diagnosis not present

## 2023-08-25 DIAGNOSIS — E11319 Type 2 diabetes mellitus with unspecified diabetic retinopathy without macular edema: Secondary | ICD-10-CM | POA: Diagnosis not present

## 2023-08-25 DIAGNOSIS — E1141 Type 2 diabetes mellitus with diabetic mononeuropathy: Secondary | ICD-10-CM

## 2023-08-25 DIAGNOSIS — M7989 Other specified soft tissue disorders: Secondary | ICD-10-CM | POA: Diagnosis not present

## 2023-08-25 DIAGNOSIS — Z79899 Other long term (current) drug therapy: Secondary | ICD-10-CM

## 2023-08-25 DIAGNOSIS — Z89519 Acquired absence of unspecified leg below knee: Secondary | ICD-10-CM | POA: Diagnosis not present

## 2023-08-25 DIAGNOSIS — T8744 Infection of amputation stump, left lower extremity: Secondary | ICD-10-CM | POA: Diagnosis not present

## 2023-08-25 DIAGNOSIS — I252 Old myocardial infarction: Secondary | ICD-10-CM

## 2023-08-25 LAB — PROTIME-INR
INR: 1.2 (ref 0.8–1.2)
Prothrombin Time: 15 s (ref 11.4–15.2)

## 2023-08-25 LAB — CBC WITH DIFFERENTIAL/PLATELET
Abs Immature Granulocytes: 0.03 10*3/uL (ref 0.00–0.07)
Basophils Absolute: 0 10*3/uL (ref 0.0–0.1)
Basophils Relative: 1 %
Eosinophils Absolute: 0 10*3/uL (ref 0.0–0.5)
Eosinophils Relative: 0 %
HCT: 24 % — ABNORMAL LOW (ref 39.0–52.0)
Hemoglobin: 7.2 g/dL — ABNORMAL LOW (ref 13.0–17.0)
Immature Granulocytes: 1 %
Lymphocytes Relative: 8 %
Lymphs Abs: 0.4 10*3/uL — ABNORMAL LOW (ref 0.7–4.0)
MCH: 26.4 pg (ref 26.0–34.0)
MCHC: 30 g/dL (ref 30.0–36.0)
MCV: 87.9 fL (ref 80.0–100.0)
Monocytes Absolute: 0.4 10*3/uL (ref 0.1–1.0)
Monocytes Relative: 7 %
Neutro Abs: 4.6 10*3/uL (ref 1.7–7.7)
Neutrophils Relative %: 83 %
Platelets: 167 10*3/uL (ref 150–400)
RBC: 2.73 MIL/uL — ABNORMAL LOW (ref 4.22–5.81)
RDW: 16.9 % — ABNORMAL HIGH (ref 11.5–15.5)
WBC: 5.5 10*3/uL (ref 4.0–10.5)
nRBC: 0.5 % — ABNORMAL HIGH (ref 0.0–0.2)

## 2023-08-25 LAB — COMPREHENSIVE METABOLIC PANEL
ALT: 17 U/L (ref 0–44)
AST: 21 U/L (ref 15–41)
Albumin: 3.1 g/dL — ABNORMAL LOW (ref 3.5–5.0)
Alkaline Phosphatase: 96 U/L (ref 38–126)
Anion gap: 13 (ref 5–15)
BUN: 22 mg/dL (ref 8–23)
CO2: 24 mmol/L (ref 22–32)
Calcium: 8.6 mg/dL — ABNORMAL LOW (ref 8.9–10.3)
Chloride: 101 mmol/L (ref 98–111)
Creatinine, Ser: 1.06 mg/dL (ref 0.61–1.24)
GFR, Estimated: >60 mL/min (ref >60)
Glucose, Bld: 209 mg/dL — ABNORMAL HIGH (ref 70–99)
Potassium: 3.7 mmol/L (ref 3.5–5.1)
Sodium: 138 mmol/L (ref 135–145)
Total Bilirubin: 0.6 mg/dL (ref 0.0–1.2)
Total Protein: 6.2 g/dL — ABNORMAL LOW (ref 6.5–8.1)

## 2023-08-25 LAB — I-STAT CHEM 8, ED
BUN: 22 mg/dL (ref 8–23)
Calcium, Ion: 1.17 mmol/L (ref 1.15–1.40)
Chloride: 99 mmol/L (ref 98–111)
Creatinine, Ser: 1.2 mg/dL (ref 0.61–1.24)
Glucose, Bld: 208 mg/dL — ABNORMAL HIGH (ref 70–99)
HCT: 22 % — ABNORMAL LOW (ref 39.0–52.0)
Hemoglobin: 7.5 g/dL — ABNORMAL LOW (ref 13.0–17.0)
Potassium: 3.7 mmol/L (ref 3.5–5.1)
Sodium: 138 mmol/L (ref 135–145)
TCO2: 25 mmol/L (ref 22–32)

## 2023-08-25 LAB — GLUCOSE, CAPILLARY: Glucose-Capillary: 207 mg/dL — ABNORMAL HIGH (ref 70–99)

## 2023-08-25 LAB — MAGNESIUM: Magnesium: 1.3 mg/dL — ABNORMAL LOW (ref 1.7–2.4)

## 2023-08-25 LAB — I-STAT CG4 LACTIC ACID, ED
Lactic Acid, Venous: 2.5 mmol/L (ref 0.5–1.9)
Lactic Acid, Venous: 1.4 mmol/L (ref 0.5–1.9)

## 2023-08-25 LAB — BRAIN NATRIURETIC PEPTIDE: B Natriuretic Peptide: 14.1 pg/mL (ref 0.0–100.0)

## 2023-08-25 LAB — PREPARE RBC (CROSSMATCH)

## 2023-08-25 MED ORDER — FINASTERIDE 5 MG PO TABS
5.0000 mg | ORAL_TABLET | Freq: Every day | ORAL | Status: DC
  Administered 2023-08-26 – 2023-09-02 (×8): 5 mg via ORAL
  Filled 2023-08-25 (×8): qty 1

## 2023-08-25 MED ORDER — TRAMADOL HCL 50 MG PO TABS
50.0000 mg | ORAL_TABLET | Freq: Once | ORAL | Status: AC
  Administered 2023-08-25: 50 mg via ORAL
  Filled 2023-08-25: qty 1

## 2023-08-25 MED ORDER — FAMOTIDINE 20 MG PO TABS
20.0000 mg | ORAL_TABLET | Freq: Every day | ORAL | Status: DC
  Administered 2023-08-26 – 2023-09-02 (×8): 20 mg via ORAL
  Filled 2023-08-25 (×8): qty 1

## 2023-08-25 MED ORDER — CYCLOBENZAPRINE HCL 5 MG PO TABS
5.0000 mg | ORAL_TABLET | Freq: Three times a day (TID) | ORAL | Status: DC | PRN
  Administered 2023-08-26 – 2023-08-29 (×4): 5 mg via ORAL
  Filled 2023-08-25 (×4): qty 1

## 2023-08-25 MED ORDER — TAMSULOSIN HCL 0.4 MG PO CAPS
0.4000 mg | ORAL_CAPSULE | Freq: Every day | ORAL | Status: DC
  Administered 2023-08-26 – 2023-09-02 (×8): 0.4 mg via ORAL
  Filled 2023-08-25 (×8): qty 1

## 2023-08-25 MED ORDER — MAGNESIUM SULFATE 2 GM/50ML IV SOLN
2.0000 g | Freq: Once | INTRAVENOUS | Status: AC
  Administered 2023-08-25: 2 g via INTRAVENOUS
  Filled 2023-08-25: qty 50

## 2023-08-25 MED ORDER — MIRABEGRON ER 25 MG PO TB24
25.0000 mg | ORAL_TABLET | Freq: Every day | ORAL | Status: DC
  Administered 2023-08-26 – 2023-09-02 (×8): 25 mg via ORAL
  Filled 2023-08-25 (×9): qty 1

## 2023-08-25 MED ORDER — TRAMADOL HCL 50 MG PO TABS
50.0000 mg | ORAL_TABLET | Freq: Four times a day (QID) | ORAL | Status: DC | PRN
  Administered 2023-08-25 – 2023-09-02 (×8): 50 mg via ORAL
  Filled 2023-08-25 (×9): qty 1

## 2023-08-25 MED ORDER — ASPIRIN 81 MG PO CHEW
81.0000 mg | CHEWABLE_TABLET | Freq: Every day | ORAL | Status: DC
  Administered 2023-08-26 – 2023-09-02 (×8): 81 mg via ORAL
  Filled 2023-08-25 (×8): qty 1

## 2023-08-25 MED ORDER — ROSUVASTATIN CALCIUM 20 MG PO TABS
20.0000 mg | ORAL_TABLET | Freq: Every day | ORAL | Status: DC
  Administered 2023-08-25 – 2023-09-01 (×8): 20 mg via ORAL
  Filled 2023-08-25 (×8): qty 1

## 2023-08-25 MED ORDER — SODIUM CHLORIDE 0.9% IV SOLUTION: Freq: Once | INTRAVENOUS | Status: DC

## 2023-08-25 MED ORDER — LACTATED RINGERS IV BOLUS (SEPSIS)
500.0000 mL | Freq: Once | INTRAVENOUS | Status: AC
  Administered 2023-08-25: 500 mL via INTRAVENOUS

## 2023-08-25 MED ORDER — INSULIN ASPART 100 UNIT/ML IJ SOLN
0.0000 [IU] | Freq: Three times a day (TID) | INTRAMUSCULAR | Status: DC
  Administered 2023-08-26: 3 [IU] via SUBCUTANEOUS
  Administered 2023-08-26: 8 [IU] via SUBCUTANEOUS
  Administered 2023-08-27: 3 [IU] via SUBCUTANEOUS
  Administered 2023-08-27: 5 [IU] via SUBCUTANEOUS
  Administered 2023-08-28 (×3): 8 [IU] via SUBCUTANEOUS
  Administered 2023-08-29 (×2): 5 [IU] via SUBCUTANEOUS
  Administered 2023-08-29: 8 [IU] via SUBCUTANEOUS
  Administered 2023-08-30 (×3): 5 [IU] via SUBCUTANEOUS
  Administered 2023-08-31 (×3): 3 [IU] via SUBCUTANEOUS
  Administered 2023-09-01: 5 [IU] via SUBCUTANEOUS

## 2023-08-25 MED ORDER — ADULT MULTIVITAMIN W/MINERALS CH
1.0000 | ORAL_TABLET | Freq: Every day | ORAL | Status: DC
  Administered 2023-08-26 – 2023-09-02 (×8): 1 via ORAL
  Filled 2023-08-25 (×8): qty 1

## 2023-08-25 MED ORDER — POTASSIUM CHLORIDE CRYS ER 20 MEQ PO TBCR
40.0000 meq | EXTENDED_RELEASE_TABLET | Freq: Once | ORAL | Status: AC
  Administered 2023-08-25: 40 meq via ORAL
  Filled 2023-08-25: qty 2

## 2023-08-25 MED ORDER — FESOTERODINE FUMARATE ER 4 MG PO TB24
4.0000 mg | ORAL_TABLET | Freq: Every day | ORAL | Status: DC
  Administered 2023-08-26 – 2023-09-02 (×8): 4 mg via ORAL
  Filled 2023-08-25 (×8): qty 1

## 2023-08-25 MED ORDER — VITAMIN C 500 MG PO TABS
1000.0000 mg | ORAL_TABLET | Freq: Every day | ORAL | Status: DC
  Administered 2023-08-26 – 2023-08-27 (×2): 1000 mg via ORAL
  Filled 2023-08-25 (×2): qty 2

## 2023-08-25 MED ORDER — VITAMIN D 25 MCG (1000 UNIT) PO TABS
1000.0000 [IU] | ORAL_TABLET | Freq: Every day | ORAL | Status: DC
  Administered 2023-08-26 – 2023-09-02 (×8): 1000 [IU] via ORAL
  Filled 2023-08-25 (×8): qty 1

## 2023-08-25 MED ORDER — ACETAMINOPHEN 500 MG PO TABS
1000.0000 mg | ORAL_TABLET | Freq: Four times a day (QID) | ORAL | Status: DC | PRN
  Administered 2023-08-26: 1000 mg via ORAL
  Filled 2023-08-25: qty 2

## 2023-08-25 NOTE — ED Provider Notes (Signed)
 [place] EMERGENCY DEPARTMENT AT [facility] Provider Note   CSN: [id number] Arrival date & time: 08/25/23  1617     History  No chief complaint on file.   Adam Ray is a 68 y.o. male.  HPI Patient presents for fall.  Medical history includes COPD, DM, HTN, PAD, anxiety, osteomyelitis, s/p bilateral BKA's.  He is currently 1 month postop from left BKA.  Earlier today, he got tangled up in his prosthetics and fell.  When he fell, he struck his left stump.  He had an open wound with an estimated 3 to 400 cc of blood loss per EMS.  He was pale and diaphoretic on scene.  SpO2 was in the 90s.  He received 150 cc IVF prior to arrival.  EMS noted foul smell to the wound.  Patient states that the was some small amount of bleeding before his fall.  He states that area is painful.  He also endorses pain "all over".    Home Medications Prior to Admission medications   Medication Sig Start Date End Date Taking? Authorizing Provider  acetaminophen (TYLENOL) 325 MG tablet Take 1-2 tablets (325-650 mg total) by mouth every 6 (six) hours as needed. 08/22/23  Yes Setzer, Lynnell Jude, PA-C  ascorbic acid (VITAMIN C) 1000 MG tablet Take 1 tablet (1,000 mg total) by mouth daily. 08/22/23  Yes Setzer, Lynnell Jude, PA-C  aspirin 81 MG chewable tablet Chew 81 mg by mouth daily.   Yes [provider]  CINNAMON PO Take 1,200 mg by mouth 2 (two) times daily. [place]   Yes [provider]  cyanocobalamin (VITAMIN B12) 1000 MCG/ML injection Inject 1 mL (1,000 mcg total) into the muscle every 30 (thirty) days. 08/22/23  Yes Setzer, Lynnell Jude, PA-C  cyclobenzaprine (FLEXERIL) 5 MG tablet Take 1 tablet (5 mg total) by mouth 3 (three) times daily as needed for muscle spasms. 08/22/23  Yes Setzer, Lynnell Jude, PA-C  Dulaglutide (TRULICITY) 0.75 MG/0.5ML SOAJ Inject 0.75 mg into the skin every 30 (thirty) days.   Yes [provider]  famotidine (PEPCID) 20 MG tablet TAKE 1 TABLET (20 MG TOTAL)  BY MOUTH 2 (TWO) TIMES DAILY AS NEEDED FOR HEARTBURN OR INDIGESTION. Patient taking differently: Take 20 mg by mouth daily. 07/14/23  Yes Latrelle Dodrill, MD  finasteride (PROSCAR) 5 MG tablet Take 5 mg by mouth daily. 01/11/23  Yes [provider]  insulin glargine (LANTUS SOLOSTAR) 100 UNIT/ML Solostar Pen Inject 23 Units into the skin daily. 08/22/23  Yes Setzer, Lynnell Jude, PA-C  metFORMIN (GLUCOPHAGE) 850 MG tablet Take 1 tablet (850 mg total) by mouth 2 (two) times daily with a meal. 08/22/23  Yes Setzer, Lynnell Jude, PA-C  Multiple Vitamin (MULTIVITAMIN WITH MINERALS) TABS tablet Take 1 tablet by mouth daily.  iron   Yes [provider]  rosuvastatin (CRESTOR) 40 MG tablet Take 1 tablet (40 mg total) by mouth daily at 6 PM. Patient taking differently: Take 20 mg by mouth at bedtime. 05/15/20  Yes Mirian Mo, MD  tamsulosin (FLOMAX) 0.4 MG CAPS capsule Take 1 capsule (0.4 mg total) by mouth daily. 11/09/21  Yes Latrelle Dodrill, MD  tolterodine (DETROL LA) 4 MG 24 hr capsule Take 4 mg by mouth daily. 10/13/22  Yes [provider]  traMADol (ULTRAM) 50 MG tablet Take 1-2 tablets (50-100 mg total) by mouth every 6 (six) hours as needed for severe pain (pain score 7-10). 08/22/23  Yes Setzer, Lynnell Jude, PA-C  Vibegron (GEMTESA) 75 MG TABS Take 75 mg by mouth daily.   Yes [provider]  vitamin D3 (CHOLECALCIFEROL) 25 MCG tablet Take 1 tablet (1,000 Units total) by mouth daily. 08/22/23  Yes Setzer, Lynnell Jude, PA-C  Accu-Chek Softclix Lancets lancets Use as directed. 08/22/23   Setzer, Lynnell Jude, PA-C  Blood Glucose Monitoring Suppl (BLOOD GLUCOSE MONITOR SYSTEM) w/Device KIT Use in the morning, at noon, and at bedtime. 08/22/23   Setzer, Lynnell Jude, PA-C  Continuous Glucose Receiver (FREESTYLE LIBRE 3 READER) DEVI 1 each by Does not apply route as directed. Use with sensor to monitor blood sugar Patient not taking: Reported on 08/25/2023 11/26/22   McDiarmid, Leighton Roach, MD   Continuous Glucose Sensor (FREESTYLE LIBRE 3 SENSOR) MISC 1 each by Does not apply route every 14 (fourteen) days. Place 1 sensor on the skin every 14 days. Use to check glucose continuously Patient not taking: Reported on 08/25/2023 01/21/23   McDiarmid, Leighton Roach, MD  Glucose Blood (BLOOD GLUCOSE TEST STRIPS) STRP Use 1 each by In Vitro route in the morning, at noon, and at bedtime. May substitute to any manufacturer covered by patient's insurance. 08/22/23 09/25/23  Setzer, Lynnell Jude, PA-C  Lancet Device MISC 1 each by Does not apply route in the morning, at noon, and at bedtime. May substitute to any manufacturer covered by patient's insurance. 08/22/23 09/21/23  Milinda Antis, PA-C  Lancets Misc. MISC 1 each by Does not apply route in the morning, at noon, and at bedtime. May substitute to any manufacturer covered by patient's insurance. 08/22/23 09/21/23  Setzer, Lynnell Jude, PA-C  levofloxacin (LEVAQUIN) 750 MG tablet Take 1 tablet (750 mg total) by mouth daily. For 14 days 07/07/12 07/24/12  Madolyn Frieze, Etta Quill, MD      Allergies    Codeine, Hydrocodone, Latex, Oxycodone, Morphine, and Propofol    Review of Systems   Review of Systems  Constitutional:  Positive for diaphoresis.  Musculoskeletal:  Positive for arthralgias and myalgias.  Skin:  Positive for wound.  Neurological:  Positive for light-headedness.  All other systems reviewed and are negative.   Physical Exam Updated Vital Signs BP 122/65   Pulse 91   Temp 98.4 F (36.9 C) (Oral)   Resp 18   Ht 5\' 10"  (1.778 m)   Wt 77.1 kg   SpO2 100%   BMI 24.39 kg/m  Physical Exam Vitals and nursing note reviewed.  Constitutional:      General: He is not in acute distress.    Appearance: Normal appearance. He is well-developed. He is not ill-appearing, toxic-appearing or diaphoretic.  HENT:     Head: Normocephalic and atraumatic.     Right Ear: External ear normal.     Left Ear: External ear normal.     Nose: Nose normal.      Mouth/Throat:     Mouth: Mucous membranes are moist.  Eyes:     Extraocular Movements: Extraocular movements intact.     Conjunctiva/sclera: Conjunctivae normal.  Cardiovascular:     Rate and Rhythm: Normal rate and regular rhythm.     Heart sounds: No murmur heard. Pulmonary:     Effort: Pulmonary effort is normal. No respiratory distress.  Abdominal:     General: There is no distension.     Palpations: Abdomen is soft.     Tenderness: There is no abdominal tenderness.  Musculoskeletal:        General: No swelling.     Cervical back: Normal  range of motion and neck supple.     Comments: Surgical wound present on left stump.  Staples are in place throughout anterior and lateral aspect.  Medial aspect is open.  There is ongoing high flow bleeding present at the site when dressing removed.  Skin:    General: Skin is warm and dry.  Neurological:     General: No focal deficit present.     Mental Status: He is alert and oriented to person, place, and time.  Psychiatric:        Mood and Affect: Mood normal.        Behavior: Behavior normal.     ED Results / Procedures / Treatments   Labs (all labs ordered are listed, but only abnormal results are displayed) Labs Reviewed  COMPREHENSIVE METABOLIC PANEL - Abnormal; Notable for the following components:      Result Value   Glucose, Bld 209 (*)    Calcium 8.6 (*)    Total Protein 6.2 (*)    Albumin 3.1 (*)    All other components within normal limits  CBC WITH DIFFERENTIAL/PLATELET - Abnormal; Notable for the following components:   RBC 2.73 (*)    Hemoglobin 7.2 (*)    HCT 24.0 (*)    RDW 16.9 (*)    nRBC 0.5 (*)    Lymphs Abs 0.4 (*)    All other components within normal limits  MAGNESIUM - Abnormal; Notable for the following components:   Magnesium 1.3 (*)    All other components within normal limits  I-STAT CG4 LACTIC ACID, ED - Abnormal; Notable for the following components:   Lactic Acid, Venous 2.5 (*)    All other  components within normal limits  I-STAT CHEM 8, ED - Abnormal; Notable for the following components:   Glucose, Bld 208 (*)    Hemoglobin 7.5 (*)    HCT 22.0 (*)    All other components within normal limits  CULTURE, BLOOD (ROUTINE X 2)  CULTURE, BLOOD (ROUTINE X 2)  PROTIME-INR  BRAIN NATRIURETIC PEPTIDE  URINALYSIS, W/ REFLEX TO CULTURE (INFECTION SUSPECTED)  MAGNESIUM  CBC  BASIC METABOLIC PANEL  LIPID PANEL  I-STAT CG4 LACTIC ACID, ED  TYPE AND SCREEN  PREPARE RBC (CROSSMATCH)    EKG None  Radiology DG Tibia/Fibula Left Result Date: 08/25/2023 CLINICAL DATA:  Questionable sepsis - evaluate for abnormality EXAM: LEFT TIBIA AND FIBULA - 2 VIEW COMPARISON:  None Available. FINDINGS: Changes of below the knee amputation. No acute bony abnormality. No joint effusion within the left knee. No soft tissue gas. The soft tissues at the stump appears swollen/edematous. No soft tissue gas. IMPRESSION: Prior left BKA.  No acute bony abnormality. Swollen/edematous soft tissues at the stump.  No soft tissue gas. Electronically Signed   By: Charlett Nose M.D.   On: 08/25/2023 19:22   DG Chest Port 1 View Result Date: 08/25/2023 CLINICAL DATA:  Questionable sepsis EXAM: PORTABLE CHEST 1 VIEW COMPARISON:  Chest x-ray 01/17/2020 FINDINGS: The heart size and mediastinal contours are within normal limits. Both lungs are clear. There are healed left upper rib fractures, unchanged. IMPRESSION: No active disease. Electronically Signed   By: Darliss Cheney M.D.   On: 08/25/2023 19:21    Procedures Procedures    Medications Ordered in ED Medications  0.9 %  sodium chloride infusion (Manually program via Guardrails IV Fluids) (0 mLs Intravenous Hold 08/25/23 1936)  acetaminophen (TYLENOL) tablet 1,000 mg (has no administration in time range)  aspirin chewable  tablet 81 mg (has no administration in time range)  traMADol (ULTRAM) tablet 50 mg (has no administration in time range)  rosuvastatin (CRESTOR)  tablet 20 mg (has no administration in time range)  famotidine (PEPCID) tablet 20 mg (has no administration in time range)  finasteride (PROSCAR) tablet 5 mg (has no administration in time range)  tamsulosin (FLOMAX) capsule 0.4 mg (has no administration in time range)  fesoterodine (TOVIAZ) tablet 4 mg (has no administration in time range)  mirabegron ER (MYRBETRIQ) tablet 25 mg (has no administration in time range)  cyclobenzaprine (FLEXERIL) tablet 5 mg (has no administration in time range)  ascorbic acid (VITAMIN C) tablet 1,000 mg (has no administration in time range)  multivitamin with minerals tablet 1 tablet (has no administration in time range)  cholecalciferol (VITAMIN D3) 25 MCG (1000 UNIT) tablet 1,000 Units (has no administration in time range)  insulin aspart (novoLOG) injection 0-15 Units (has no administration in time range)  lactated ringers bolus 500 mL (0 mLs Intravenous Stopped 08/25/23 1935)  traMADol (ULTRAM) tablet 50 mg (50 mg Oral Given 08/25/23 1721)  magnesium sulfate IVPB 2 g 50 mL (0 g Intravenous Stopped 08/25/23 2105)  potassium chloride SA (KLOR-CON M) CR tablet 40 mEq (40 mEq Oral Given 08/25/23 1935)    ED Course/ Medical Decision Making/ A&P                                 Medical Decision Making Amount and/or Complexity of Data Reviewed Labs: ordered. Radiology: ordered.  Risk Prescription drug management. Decision regarding hospitalization.   This patient presents to the ED for concern of bleeding wound, this involves an extensive number of treatment options, and is a complaint that carries with it a high risk of complications and morbidity.  The differential diagnosis includes blood loss anemia, infected wound   Co morbidities that complicate the patient evaluation  COPD, DM, HTN, PAD, anxiety, osteomyelitis, s/p bilateral BKA's   Additional history obtained:  Additional history obtained from EMS External records from outside source obtained  and reviewed including EMR   Lab Tests:  I Ordered, and personally interpreted labs.  The pertinent results include: 1.4 g/dL drop in hemoglobin when compared to lab work from a week ago.  No leukocytosis is present.  Hypomagnesemia is present with otherwise normal electrolytes.   Imaging Studies ordered:  I ordered imaging studies including x-ray of chest and left stump. I independently visualized and interpreted imaging which showed no acute findings I agree with the radiologist interpretation   Cardiac Monitoring: / EKG:  The patient was maintained on a cardiac monitor.  I personally viewed and interpreted the cardiac monitored which showed an underlying rhythm of: Sinus rhythm   Consultations Obtained:  I requested consultation with the orthopedic surgeon on-call, Dr. Magnus Ivan,  and discussed lab and imaging findings as well as pertinent plan - they recommend: Dr. Lajoyce Corners will see in the morning.   Problem List / ED Course / Critical interventions / Medication management  Patient presents after fall.  During his fall, he sustained an opening to his surgical wound from recent left BKA.  EMS noted large amount of blood loss on scene, estimated to be 300 to 400 cc.  On arrival, patient is awake and alert.  He remains hypotensive with SBP in the 90s.  He states that he does take blood pressure medications but takes these in the morning.  He states  that he has pain in the area of his wound in addition to pain all over.  He takes tramadol for pain at home.  This was ordered.  IV fluids were ordered for hydration.  Workup was initiated.  Patient's wound was dressed by EMS and gauze.  When gauze was removed, he has ongoing high flow bleeding from the medial aspect of his surgical wound.  Pressure dressing was applied with gauze and [place].  I spoke with orthopedic surgeon on-call who will notify Dr. Lajoyce Corners to see in the morning.  Lab work does show an acute drop in hemoglobin.  Given his history of  CHF patient would have a transfusion threshold of 8.  He was consented for blood transfusion.  1 unit PRBCs was ordered.  Patient was admitted for further management. I ordered medication including IV fluids for hydration; tramadol for analgesia; potassium chloride and magnesium sulfate for electrolyte optimization; PRBCs for symptomatic anemia Reevaluation of the patient after these medicines showed that the patient improved I have reviewed the patients home medicines and have made adjustments as needed   Social Determinants of Health:  Has access to outpatient care  CRITICAL CARE Performed by: Gloris Manchester   Total critical care time: 32 minutes  Critical care time was exclusive of separately billable procedures and treating other patients.  Critical care was necessary to treat or prevent imminent or life-threatening deterioration.  Critical care was time spent personally by me on the following activities: development of treatment plan with patient and/or surrogate as well as nursing, discussions with consultants, evaluation of patient's response to treatment, examination of patient, obtaining history from patient or surrogate, ordering and performing treatments and interventions, ordering and review of laboratory studies, ordering and review of radiographic studies, pulse oximetry and re-evaluation of patient's condition.         Final Clinical Impression(s) / ED Diagnoses Final diagnoses:  Dehiscence of amputation stump of left lower extremity (HCC)  Acute blood loss anemia  Hypomagnesemia  Type 2 diabetes mellitus with other diabetic arthropathy, with long-term current use of insulin (HCC)  LBBB (left bundle branch block)    Rx / DC Orders ED Discharge Orders     None         Gloris Manchester, MD 08/25/23 2140

## 2023-08-25 NOTE — Hospital Course (Addendum)
 Adam Ray is a 68 y.o. male who was admitted to the [facility] Medicine Teaching Service at [facility] for open wound on left BKA stump status post a fall.  Past medical history was significant for bilateral BKA's in the setting of PAD, COPD, OSA, T2DM, hypertension, CAD, bicuspid aortic valve, sickle cell trait.  Hospital course is outlined below by problem.   Left BKA stump wound s/p flap revision  Presented after a fall with getting tangled up in prosthetics. 300-400 mL of blood loss, as well as a foul smell of the stump, noticed by EMS after they were called.  Vitals overall stable though patient appeared pale and diaphoretic.  He endorsed pain.  X-rays of the left tib-fib demonstrated soft tissue edema without fracture.  Compression dressing was placed he was given home tramadol for pain control. Ortho surgery consulted who took him to the OR on 2/26 for flap repair.  Flap revision was successful, and patient recovered appropriately.  Recommendation was for patient to go to SNF for short-term rehab once he was stable for transfer.  Acute blood loss on chronic anemia  Per EMS, pt lost 300-476mL of blood prior to arrival from stump. Hgb was below threshold at 7.2 on admission and he was subsequently given [facility] with improvement.  Required a third unit of PRBCs following flap revision.  His hgb was monitored throughout the rest of his admission and stayed above threshold following transfusions.   Type II Diabetes  Pt presented with elevated sugars to low 200s. He was started on SSI.  He had a brief spike in sugars which required the addition of 15 units of Lantus which brought him back into the low mid 200s.  He was stable for several days sugar spiked again into the mid 200s; long-acting insulin was increased to 28 units daily.  At time of discharge he was receiving***.  LBBB:  New this admission from EKG on 06/24. QRS was prolonged to 130 and QTC to 513. No chest pain, shortness of breath, dizziness, or  other s/sx to suggest ACS.   Hypomagnesemia (resolved)  Pt presented with Mg to 1.3 on admission and was repleted appropriately. Mg was monitored and remained normal throughout admission.   Other conditions that were chronic and stable:  COPD: Clinically silent. No SOB. HTN: BP remained normal throughout admission.  PAD: Continued on home aspirin, Crestor  Hx of osteomyelitis: X-ray negative for obvious infectious process.   BPH: Continued on home finasteride.  Issues for follow up: Needs new walker (one side broken lock), and hooks/bars for shower Monitor sugars and need for further alterations to DM regimen  Consider repeat Mg

## 2023-08-25 NOTE — Progress Notes (Signed)
 Inpatient Rehabilitation Care Coordinator Discharge Note   Patient Details  Name: Adam Ray MRN: [id number] Date of Birth: [birth year]   Discharge location: D/c to home with his wife  Length of Stay: 17 days  Discharge activity level: wheelchair level Supervision  Home/community participation: Limited  Patient response WU:[id number] Literacy - How often do you need to have someone help you when you read instructions, pamphlets, or other written material from your doctor or pharmacy?: Never  Patient response [id number] Isolation - How often do you feel lonely or isolated from those around you?: Rarely  Services provided included: MD, RD, PT, OT, RN, CM, Pharmacy, Neuropsych, SW, TR  Financial Services:  Field seismologist Utilized: Media planner ([facility] Medicare) [place]  Choices offered to/list presented to: patient and pt wife  Follow-up services arranged:  Home Health, DME, Patient/Family has no preference for HH/DME agencies Home Health Agency: [facility] for HHPT/OT/SN/aide    DME : [facility] DME to home-light weight wheelchair (K4/K5), bariatric bedside commode, hospital bed, and tub transfer bench    Patient response to transportation need: Is the patient able to respond to transportation needs?: Yes In the past 12 months, has lack of transportation kept you from medical appointments or from getting medications?: No In the past 12 months, has lack of transportation kept you from meetings, work, or from getting things needed for daily living?: No   Patient/Family verbalized understanding of follow-up arrangements:  Yes  Individual responsible for coordination of the follow-up plan: contact pt wife [phone]  Confirmed correct DME delivered: Gretchen Short 08/25/2023    Comments (or additional information):fam edu completed. Pt will not have 24/7 care at discharge due to hs wife working- works in Development worker, community at [employer] and off at 12:30pm. Wife does  not have transportation and takes the bus. She typically arrives home by 1:30pm.   Summary of Stay    Date/Time Discharge Planning CSW  08/20/23 1620 Pt will d/c tohome with his s/o. Pt wife works PT M-F 8am-12:30pm. Pt wants all services through [place]. Pt is not service connected and only eligible for [facility] and DME. [facility] referral placed. [facility]HA- [facility] for HHPT/OT/SLP/aide/SN. DME- hospital bed, TTB, bariatric DABSC, and speicalty w/c -to be delivered to home by [place].  WIll confirm there are no barriers to discharge. AAC  08/12/23 0946 Pt will d/c tohome with his s/o. Pt wife works PT M-F 8am-12:30pm. Pt wants all services through [place]. Pt is not service connected and only eligible for [facility] and DME. [facility] aide referral placed. WIll confirm there are no barriers to discharge. AAC  08/06/23 1531 TBA. per EMR, pt will d/c tohome with his s/o. WIll confirm there are no barriers to discharge. AAC       Climmie Buelow A Lula Olszewski

## 2023-08-25 NOTE — ED Triage Notes (Addendum)
 [place] ems:  Arrived on scene at pts home, sitting on the floor  in a puddle of blood. Pt stats to ems he was tangled up in prosthetics causing wound from recent amputation (72 weeks old)in leg to open. Found to be pale and diaphoretic. Wound leaked thru dressing, but was stopped by ems by further dressing.   Estimated blood loss 300-400 ml.   Ems states wound Is odorous.  Pt complaining of weakness, dizziness and nausea which resolved with fluid bolus (250 ml of ns).  BP 120/84 90hr 100% spo2 room air 204 cbg  20 gauge iv In right arm placed by ems.

## 2023-08-25 NOTE — H&P (Cosign Needed Addendum)
 Hospital Admission History and Physical Service Pager: [phone]  Patient name: Adam Ray Medical record number: [id number] Date of Birth: Mar 11, 1956 Age: 68 y.o. Gender: male  Primary Care Provider: Latrelle Dodrill, MD Consultants: Orthopedic surgery Code Status: Full Preferred Emergency Contact: Granvel Proudfoot, wife [phone])  Chief Complaint: BKA dehiscence and blood loss  Assessment and Plan: Adam Ray is a 68 y.o. male presenting with fall complicated by left BKA dehiscence and 300-400 mL blood loss. Differential for this patient's presentation of this includes:  Deconditioning leading to fall: Likeliest etiology.  Per patient report, he was maneuvering on a defective roll later and was unused to the movement necessary to sit down, leading to fall.  He has since was mechanical in nature -- occurred as patient was removing right lower extremity from behind a dresser. Patient was conscious throughout his unwitnessed fall.  No dizziness, blurry vision, vertigo, loss of balance before mechanical fall.  Per patient's wife, patient was visibly struggling with transferring before discharge from rehab 2/22, and it is her opinion that he should not have been discharged at that time.  Patient dizziness, weakness and nausea was noted only after wound began to bleed, and resolved with 250 mL fluid bolus per chart review.  Wound infection leading to AMS and fall: Unlikely.  Afebrile throughout ED stay. WBC 5.5.  BPs WNL.  Patient denies fever, infectious symptoms both now and since discharge from rehab 2/22.  EMS noted foul smell to wound, however patient and patient's wife did not notice any smell prior to fall. On examination of patient's wound (see media below), trace serous fluid on anterior flap, but otherwise no purulence, weeping, discharge or odor. CVA/orthostasis/seizure/cardiac arrest leading to fall: Patient denies loss of consciousness, headache, vision change, chest pain,  dizziness or weakness immediately prior to fall.  Patient was conscious throughout fall. Assessment & Plan Dehiscence of amputation stump of left lower extremity (HCC) Likely secondary to fall from deconditioning and failure to properly transfer iso of recent surgery for L BKA.  Very low suspicion for infectious process at this time based on observations above. - Admit to [facility] Teaching [facility] (Dr. Jennette Kettle attending). - To be seen by orthopedic service in a.m. 2/24, tentatively plan for OR on 2/26 per Lajoyce Corners for flap revision - Defer IV antibiotics for now, CTM for infectious symptomatology.  - Continue home tramadol 50 mg every 6 hours for pain control. - Start Tylenol 1000 mg every 6 hours for pain control. - Trend CBCs, BMPs, repeat Lactic Acid.  - PT/OT consult placed. Acute blood loss anemia Per EMS, likely lost 300 to 400 mL on initial fall. Clot formed, bleeding now staunched and compression applied. - S/p 1 unit pRBC - Follow-up H&H. Replete Hgb <8.0 in setting of CAD. Hypomagnesemia 1.3 on admission. - S/p 2 g IV mag. - Follow-up mag in a.m. Replete if <2.0.  Type 2 diabetes mellitus with other diabetic arthropathy, with long-term current use of insulin (HCC) A1c 10%. Glucose 209 on admission.  - Start moderate SSI and add back home regimen as appropriate LBBB (left bundle branch block) Appears new from EKG 06/24.  QRS duration prolonged, 130.  No chest pain, SOB, dizziness to suggest ACS.  QTc 513. - Could consider repeat troponins/EKG in setting of new chest pain, SOB.  - Avoid QTc prolonging medications where possible.  Chronic and Stable Conditions: COPD: Clinically silent. No SOB. HTN: BPs WNL.  CTM. PAD: Continue home aspirin, Crestor  40 mg. Hx of osteomyelitis: X-ray negative for obvious infectious process.  Could do further imaging/biopsy if indicated. BPH: Continue home finasteride.  FEN/GI: Carb-modified/Heart healthy VTE Prophylaxis: Home aspirin (Lovenox  30 mL BID attempted during recent inpatient rehab stay, was discontinued d/t bleeding.)  Disposition: med-surg  History of Present Illness:  Adam Ray is a 68 y.o. male with PAD, CAD, HTN, T2DM (A1c 10%) and PAD complicated by bilateral BKA   Of note, patient underwent L BKA on 08/01/2023 and was admitted to inpatient rehab 2/4. Was on wound vac for two weeks. Noted ulcer over transmetatarsal amputation 2 cm in diameter at the time. Noted ischemic changes to skin edges, compressive dressing applied. Patient appealed discharge 2/19 and was denied. Patient was discharged 2/22 for wheelchair-level supervision in care of wife.   On interview with wife in room, patient had an amputation in January 2025 and discharged 2 days ago from inpatient rehab. Over the last two days, he had been trying to be more mobile. Today, he was trying to get up to shave. He was trying to use his walker with his prosthetics, and one side of the walker's lock is broken, so it was not stable, and he fell down on his left BKA stump and it went between the dresser. He pulled the stump out, and it started to bleed heavily at that time with clots. After touching the area, he smelled his hand, and he smelled a foul smell with the drainage from the stump. He had not previously noticed any bad smell.  No fevers, chest pain, shortness of breath. No changes in bowel habits. No headaches or changes in vision. He did not hit his head with this fall. He remembers the entire event. He did feel nauseous after the fall but did not vomit. He was noted to be pale and diaphoretic after the fall, as well. He was fatigued after losing this blood. His wife came back inside and helped him get back up. He was alert at that time. That is when EMS was called.  In the ED, patient was found to have HgB of 7.2, given 1u pRBC. Lactic acid 2.5. Also tachy to 104 and hypomagnesemic to 1.3. Given IV LR 500 mL x1, and IV mag 2g. K 3.7, repleted. Given PO tramodol  50 mg x1.   Review Of Systems: Per HPI with the following additions: none.  Pertinent Past Medical History: Bilateral BKA COPD OSA T2DM HTN CAD (STEMI 2020) PAD BPH Bicuspid aortic valve Cervical spondylosis  Unspecified anxiety Unspecified depression Osteomyelitis Urinary incontinence Sickle cell trait Tobacco Use Disorder MDD L eye cataract B12 deficiency  Remainder reviewed in history tab.   Pertinent Past Surgical History: R BKA 12/2019 L BKA 07/2023 Remainder reviewed in history tab.   Pertinent Social History: Tobacco use: Former, quit 4 years ago (16.8-pack-year smoking history) Alcohol use: None current Other Substance use: Denies Lives with wife  Pertinent Family History: Mother: Diabetes, stroke Father: MI, CHF Sister: T2DM Brother: T2DM, heart disease Remainder reviewed in history tab.   Important Outpatient Medications: Aspirin 81 daily Trulicity 0.5 mg monthly PRN Flexeril 5 mg 3 times daily Finasteride 5 mg Metformin and 50 mg twice daily 23 units insulin glargine daily Remainder reviewed in medication history.   Objective: BP 120/66   Pulse 92   Temp 98.4 F (36.9 C)   Resp (!) 22   Ht 5\' 10"  (1.778 m)   Wt 77.1 kg   SpO2 100%   BMI  24.39 kg/m  Exam: General: Ill-appearing man, sitting up in bed, attentive to interview Eyes: Anicteric Cardiovascular: Regular and rhythm no murmurs rubs or gallops Respiratory: Clear to auscultation in anterior lung fields bilaterally Gastrointestinal: Bowel sounds present, no tenderness MSK: Bilateral lower extremity below the knee amputations.  Right lower extremity in sleeve prosthesis device, superficially stained with blood likely from opposite limb.  Left lower extremity: Wound dehiscence, new clot on medial left leg, no gross bleeding, black eschar over anterior portion of flap with trace serosanguineous fluid.  No purulence, erythema, or foul odor noted.   Neuro: No gross deficits. Psych:  Appropriate mood and affect.  Labs:  CBC BMET  Recent Labs  Lab 08/25/23 1650 08/25/23 1714  WBC 5.5  --   HGB 7.2* 7.5*  HCT 24.0* 22.0*  PLT 167  --    Recent Labs  Lab 08/25/23 1650 08/25/23 1714  NA 138 138  K 3.7 3.7  CL 101 99  CO2 24  --   BUN 22 22  CREATININE 1.06 1.20  GLUCOSE 209* 208*  CALCIUM 8.6*  --     Pertinent additional labs A1c 10% Mg 1.3 Glu 209 PTT/INR WNL Lactic acid 2.5  BNP 14.1  EKG: My own interpretation (not copied from electronic read) LBBB QTc 513  Imaging Studies Performed: XR L Tibia/Fibula (2/24) IMPRESSION: Prior left BKA.  No acute bony abnormality. Swollen/edematous soft tissues at the stump.  No soft tissue gas.   XR Chest (2/24) IMPRESSION: No active disease.  Tomie China, MD 08/25/2023, 7:09 PM PGY-1, [facility] FPTS Intern pager: [phone], text pages welcome Secure chat group [facility] Teaching Service    I agree with the assessment and plan as documented above.  Janeal Holmes, MD PGY-2, [facility]

## 2023-08-26 ENCOUNTER — Encounter: Payer: Self-pay | Admitting: Hematology

## 2023-08-26 DIAGNOSIS — Z794 Long term (current) use of insulin: Secondary | ICD-10-CM

## 2023-08-26 DIAGNOSIS — D62 Acute posthemorrhagic anemia: Secondary | ICD-10-CM | POA: Diagnosis not present

## 2023-08-26 DIAGNOSIS — Z89512 Acquired absence of left leg below knee: Secondary | ICD-10-CM | POA: Diagnosis not present

## 2023-08-26 DIAGNOSIS — S88112A Complete traumatic amputation at level between knee and ankle, left lower leg, initial encounter: Secondary | ICD-10-CM

## 2023-08-26 DIAGNOSIS — I447 Left bundle-branch block, unspecified: Secondary | ICD-10-CM | POA: Diagnosis present

## 2023-08-26 DIAGNOSIS — T8781 Dehiscence of amputation stump: Secondary | ICD-10-CM

## 2023-08-26 DIAGNOSIS — E119 Type 2 diabetes mellitus without complications: Secondary | ICD-10-CM

## 2023-08-26 DIAGNOSIS — W1830XA Fall on same level, unspecified, initial encounter: Secondary | ICD-10-CM | POA: Diagnosis not present

## 2023-08-26 LAB — CBC
HCT: 23.5 % — ABNORMAL LOW (ref 39.0–52.0)
HCT: 26 % — ABNORMAL LOW (ref 39.0–52.0)
HCT: 28.3 % — ABNORMAL LOW (ref 39.0–52.0)
Hemoglobin: 7.7 g/dL — ABNORMAL LOW (ref 13.0–17.0)
Hemoglobin: 8.3 g/dL — ABNORMAL LOW (ref 13.0–17.0)
Hemoglobin: 9.2 g/dL — ABNORMAL LOW (ref 13.0–17.0)
MCH: 26.4 pg (ref 26.0–34.0)
MCH: 26.8 pg (ref 26.0–34.0)
MCH: 27.1 pg (ref 26.0–34.0)
MCHC: 31.9 g/dL (ref 30.0–36.0)
MCHC: 32.5 g/dL (ref 30.0–36.0)
MCHC: 32.8 g/dL (ref 30.0–36.0)
MCV: 81.9 fL (ref 80.0–100.0)
MCV: 82.8 fL (ref 80.0–100.0)
MCV: 83.2 fL (ref 80.0–100.0)
Platelets: 127 10*3/uL — ABNORMAL LOW (ref 150–400)
Platelets: 130 10*3/uL — ABNORMAL LOW (ref 150–400)
Platelets: 147 10*3/uL — ABNORMAL LOW (ref 150–400)
RBC: 2.87 MIL/uL — ABNORMAL LOW (ref 4.22–5.81)
RBC: 3.14 MIL/uL — ABNORMAL LOW (ref 4.22–5.81)
RBC: 3.4 MIL/uL — ABNORMAL LOW (ref 4.22–5.81)
RDW: 16.9 % — ABNORMAL HIGH (ref 11.5–15.5)
RDW: 17 % — ABNORMAL HIGH (ref 11.5–15.5)
RDW: 17.2 % — ABNORMAL HIGH (ref 11.5–15.5)
WBC: 3.8 10*3/uL — ABNORMAL LOW (ref 4.0–10.5)
WBC: 3.9 10*3/uL — ABNORMAL LOW (ref 4.0–10.5)
WBC: 4.1 10*3/uL (ref 4.0–10.5)
nRBC: 0 % (ref 0.0–0.2)
nRBC: 0.5 % — ABNORMAL HIGH (ref 0.0–0.2)
nRBC: 0.5 % — ABNORMAL HIGH (ref 0.0–0.2)

## 2023-08-26 LAB — BASIC METABOLIC PANEL
Anion gap: 11 (ref 5–15)
BUN: 19 mg/dL (ref 8–23)
CO2: 24 mmol/L (ref 22–32)
Calcium: 8.5 mg/dL — ABNORMAL LOW (ref 8.9–10.3)
Chloride: 100 mmol/L (ref 98–111)
Creatinine, Ser: 0.91 mg/dL (ref 0.61–1.24)
GFR, Estimated: >60 mL/min (ref >60)
Glucose, Bld: 225 mg/dL — ABNORMAL HIGH (ref 70–99)
Potassium: 4.1 mmol/L (ref 3.5–5.1)
Sodium: 135 mmol/L (ref 135–145)

## 2023-08-26 LAB — LIPID PANEL
Cholesterol: 75 mg/dL (ref 0–200)
HDL: 35 mg/dL — ABNORMAL LOW (ref >40)
LDL Cholesterol: 20 mg/dL (ref 0–99)
Total CHOL/HDL Ratio: 2.1 {ratio}
Triglycerides: 102 mg/dL (ref <150)
VLDL: 20 mg/dL (ref 0–40)

## 2023-08-26 LAB — URINALYSIS, W/ REFLEX TO CULTURE (INFECTION SUSPECTED)
Bacteria, UA: NONE SEEN
Bilirubin Urine: NEGATIVE
Glucose, UA: 50 mg/dL — AB
Hgb urine dipstick: NEGATIVE
Ketones, ur: 20 mg/dL — AB
Leukocytes,Ua: NEGATIVE
Nitrite: NEGATIVE
Protein, ur: NEGATIVE mg/dL
Specific Gravity, Urine: 1.016 (ref 1.005–1.030)
pH: 5 (ref 5.0–8.0)

## 2023-08-26 LAB — GLUCOSE, CAPILLARY
Glucose-Capillary: 169 mg/dL — ABNORMAL HIGH (ref 70–99)
Glucose-Capillary: 273 mg/dL — ABNORMAL HIGH (ref 70–99)
Glucose-Capillary: 201 mg/dL — ABNORMAL HIGH (ref 70–99)

## 2023-08-26 LAB — PREPARE RBC (CROSSMATCH)

## 2023-08-26 LAB — MAGNESIUM: Magnesium: 1.8 mg/dL (ref 1.7–2.4)

## 2023-08-26 LAB — PREALBUMIN: Prealbumin: 18 mg/dL (ref 18–38)

## 2023-08-26 MED ORDER — CHLORHEXIDINE GLUCONATE 4 % EX SOLN
60.0000 mL | Freq: Once | CUTANEOUS | Status: AC
  Administered 2023-08-27: 4 via TOPICAL
  Filled 2023-08-26: qty 60

## 2023-08-26 MED ORDER — SODIUM CHLORIDE 0.9% IV SOLUTION: Freq: Once | INTRAVENOUS | Status: AC

## 2023-08-26 MED ORDER — POVIDONE-IODINE 10 % EX SWAB
2.0000 | Freq: Once | CUTANEOUS | Status: AC
  Administered 2023-08-27: 2 via TOPICAL

## 2023-08-26 MED ORDER — CEFAZOLIN SODIUM-DEXTROSE 2-4 GM/100ML-% IV SOLN
2.0000 g | INTRAVENOUS | Status: AC
  Administered 2023-08-27: 2 g via INTRAVENOUS
  Filled 2023-08-26: qty 100

## 2023-08-26 NOTE — Consult Note (Signed)
 ORTHOPAEDIC CONSULTATION  REQUESTING PHYSICIAN: Nestor Ramp, MD  Chief Complaint: Fall with dehiscence left below-knee amputation.  HPI: Adam Ray is a 68 y.o. male who presents with dehiscence left below-knee amputation.  Patient states that he was in inpatient rehab states that he and his wife did not feel safe for discharge to home and after discharge to home patient fell at home sustaining dehiscence of the left below-knee amputation and blood loss.  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   Status post transmetatarsal amputation of foot, 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/2018   STEMI involving left circumflex coronary artery (HCC) 07/12/2018   Coronary artery disease   Subacute osteomyelitis, right ankle and foot (HCC) 01/29/2016   Testicular mass 04/18/2016   Past Surgical History:  Procedure Laterality Date   AMPUTATION Right 01/31/2016   Procedure: Right 2nd Toe Amputation;  Surgeon: [name], MD;  Location: [facility] OR;  Service: Orthopedics;  Laterality: Right;   AMPUTATION Right 07/08/2018   Procedure: RIGHT TRANSMETATARSAL AMPUTATION;  Surgeon: [name], MD;  Location: [facility] OR;  Service: Orthopedics;  Laterality: Right;   AMPUTATION Right 01/05/2020   Procedure: RIGHT BELOW KNEE AMPUTATION;  Surgeon: [name], MD;  Location: [facility] OR;  Service: Orthopedics;  Laterality: Right;   AMPUTATION Right 02/23/2020   Procedure: RIGHT BELOW KNEE AMPUTATION  REVISION;  Surgeon: [name], MD;  Location: [facility] OR;  Service: Orthopedics;  Laterality: Right;   AMPUTATION Left 10/11/2022   Procedure: LEFT GREAT TOE AMPUTATION AND LEFT 2ND TOE AMPUTATION;  Surgeon: [name], MD;  Location: [facility] OR;  Service: Orthopedics;  Laterality: Left;   AMPUTATION Left 12/25/2022   Procedure: LEFT TRANSMETATARSAL AMPUTATION;  Surgeon: [name], MD;  Location: [facility] OR;  Service: Orthopedics;  Laterality: Left;   AMPUTATION Left 08/01/2023   Procedure: LEFT BELOW KNEE AMPUTATION;  Surgeon: [name], MD;  Location: [facility] OR;  Service: Orthopedics;  Laterality: Left;   CATARACT EXTRACTION     right eye   COLONOSCOPY     CORONARY STENT INTERVENTION N/A 07/12/2018   Procedure: CORONARY STENT INTERVENTION;  Surgeon: Lennette Bihari, MD;  Location: [facility] INVASIVE CV LAB;  Service: Cardiovascular;  Laterality: N/A;   CORONARY/GRAFT ACUTE MI REVASCULARIZATION N/A 07/12/2018   Procedure: Coronary/Graft Acute MI Revascularization;  Surgeon: Lennette Bihari, MD;  Location: [facility] INVASIVE CV LAB;  Service: Cardiovascular;  Laterality: N/A;   I & D EXTREMITY  04/11/2012   Procedure: IRRIGATION AND DEBRIDEMENT EXTREMITY;  Surgeon: Toni Arthurs, MD;  Location: [facility] OR;  Service: Orthopedics;  Laterality: Right;   LEFT HEART CATH AND CORONARY ANGIOGRAPHY N/A 07/12/2018   Procedure: LEFT HEART CATH AND CORONARY ANGIOGRAPHY;  Surgeon: Lennette Bihari, MD;  Location: [facility] INVASIVE CV LAB;  Service: Cardiovascular;  Laterality: N/A;   Surgery left great toe     Tear ducts bilateral eyes     TRANSMETATARSAL AMPUTATION Right 07/08/2018   Social History   Socioeconomic History   Marital status: Married    Spouse name: Marylene Land   Number of children: 3   Years of education: Not on file   Highest education level: Not on file  Occupational History   Occupation: disabled  Tobacco Use   Smoking status: Former    Current packs/day: 0.00    Average packs/day: 0.3 packs/day for 55.9 years  (16.8 ttl pk-yrs)    Types: Cigars, Cigarettes    Start date: 07/02/1963    Quit date: 06/01/2019    Years since quitting: 4.2    Passive exposure: Past   Smokeless tobacco: Former  Building services engineer status: Former  Substance and Sexual Activity   Alcohol use: Not Currently   Drug use: No   Sexual activity: Not Currently  Other Topics Concern   Not on file  Social History Narrative   Not on file   Social Drivers of Health   Financial Resource Strain: Medium Risk (08/23/2022)   Overall Financial Resource Strain (CARDIA)    Difficulty of Paying Living Expenses: Somewhat hard  Food Insecurity: No Food Insecurity (08/25/2023)   Hunger Vital Sign    Worried About Running Out of Food in the Last Year: Never true    Ran Out of Food in the Last Year: Never true  Transportation Needs: Unmet Transportation Needs (08/25/2023)   PRAPARE - Transportation    Lack of Transportation (Medical): Yes    Lack of Transportation (Non-Medical): Yes  Physical Activity: Insufficiently Active (08/23/2022)   Exercise Vital Sign    Days of Exercise per Week: 2 days    Minutes of Exercise per Session: 60 min  Stress: No Stress Concern Present (08/23/2022)   [employer] of Occupational Health - Occupational Stress Questionnaire    Feeling of Stress : Not at all  Social Connections: Moderately Integrated (08/25/2023)   Social Connection and Isolation Panel [NHANES]    Frequency of Communication with Friends and Family: Twice a week    Frequency of Social Gatherings with Friends and Family: Never    Attends Religious Services: More than 4 times per year    Active Member of [employer] or Organizations: Yes    Attends Banker Meetings: 1 to 4 times per year    Marital Status: Married   Family History  Problem Relation Age of Onset   Diabetes Mother    Stroke Mother    Heart failure Father    Heart attack Father    Diabetes Sister        multiple siblings   Diabetes Brother        muliple  siblings   Heart disease Brother    Colon cancer Neg Hx    Esophageal cancer Neg Hx    Rectal cancer Neg Hx    Stomach cancer Neg Hx    - negative except otherwise stated in the family history section Allergies  Allergen Reactions   Codeine Other (See Comments)    Heart attack.   Hydrocodone Other (See Comments)    hallucinations   Latex Hives and Itching   Oxycodone Other (See Comments)    hallucinations   Morphine Other (See Comments)    Patient preference   Propofol Other (See Comments)    Patient preference   Prior to Admission medications  Medication Sig Start Date End Date Taking? Authorizing Provider  acetaminophen (TYLENOL) 325 MG tablet Take 1-2 tablets (325-650 mg total) by mouth every 6 (six) hours as needed. 08/22/23  Yes Setzer, Lynnell Jude, PA-C  ascorbic acid (VITAMIN C) 1000 MG tablet Take 1 tablet (1,000 mg total) by mouth daily. 08/22/23  Yes Setzer, Lynnell Jude, PA-C  aspirin 81 MG chewable tablet Chew 81 mg by mouth daily.   Yes [provider]  CINNAMON PO Take 1,200 mg by mouth 2 (two) times daily. [place]   Yes [provider]  cyanocobalamin (VITAMIN B12) 1000 MCG/ML injection Inject 1 mL (1,000 mcg total) into the muscle every 30 (thirty) days. 08/22/23  Yes Setzer, Lynnell Jude, PA-C  cyclobenzaprine (FLEXERIL) 5 MG tablet Take 1 tablet (5 mg total) by mouth 3 (three) times daily as needed for muscle spasms. 08/22/23  Yes Setzer, Lynnell Jude, PA-C  Dulaglutide (TRULICITY) 0.75 MG/0.5ML SOAJ Inject 0.75 mg into the skin every 30 (thirty) days.   Yes [provider]  famotidine (PEPCID) 20 MG tablet TAKE 1 TABLET (20 MG TOTAL) BY MOUTH 2 (TWO) TIMES DAILY AS NEEDED FOR HEARTBURN OR INDIGESTION. Patient taking differently: Take 20 mg by mouth daily. 07/14/23  Yes Latrelle Dodrill, MD  finasteride (PROSCAR) 5 MG tablet Take 5 mg by mouth daily. 01/11/23  Yes [provider]  insulin glargine (LANTUS SOLOSTAR) 100 UNIT/ML Solostar Pen Inject  23 Units into the skin daily. 08/22/23  Yes Setzer, Lynnell Jude, PA-C  metFORMIN (GLUCOPHAGE) 850 MG tablet Take 1 tablet (850 mg total) by mouth 2 (two) times daily with a meal. 08/22/23  Yes Setzer, Lynnell Jude, PA-C  Multiple Vitamin (MULTIVITAMIN WITH MINERALS) TABS tablet Take 1 tablet by mouth daily.  iron   Yes [provider]  rosuvastatin (CRESTOR) 40 MG tablet Take 1 tablet (40 mg total) by mouth daily at 6 PM. Patient taking differently: Take 20 mg by mouth at bedtime. 05/15/20  Yes Mirian Mo, MD  tamsulosin (FLOMAX) 0.4 MG CAPS capsule Take 1 capsule (0.4 mg total) by mouth daily. 11/09/21  Yes Latrelle Dodrill, MD  tolterodine (DETROL LA) 4 MG 24 hr capsule Take 4 mg by mouth daily. 10/13/22  Yes [provider]  traMADol (ULTRAM) 50 MG tablet Take 1-2 tablets (50-100 mg total) by mouth every 6 (six) hours as needed for severe pain (pain score 7-10). 08/22/23  Yes Setzer, Lynnell Jude, PA-C  Vibegron (GEMTESA) 75 MG TABS Take 75 mg by mouth daily.   Yes [provider]  vitamin D3 (CHOLECALCIFEROL) 25 MCG tablet Take 1 tablet (1,000 Units total) by mouth daily. 08/22/23  Yes Setzer, Lynnell Jude, PA-C  Accu-Chek Softclix Lancets lancets Use as directed. 08/22/23   Setzer, Lynnell Jude, PA-C  Blood Glucose Monitoring Suppl (BLOOD GLUCOSE MONITOR SYSTEM) w/Device KIT Use in the morning, at noon, and at bedtime. 08/22/23   Setzer, Lynnell Jude, PA-C  Continuous Glucose Receiver (FREESTYLE LIBRE 3 READER) DEVI 1 each by Does not apply route as directed. Use with sensor to monitor blood sugar Patient not taking: Reported on 08/25/2023 11/26/22   McDiarmid, Leighton Roach, MD  Continuous Glucose Sensor (FREESTYLE LIBRE 3 SENSOR) MISC 1 each by Does not apply route every 14 (fourteen) days. Place 1 sensor on the skin every 14 days. Use to check glucose continuously Patient not taking: Reported on 08/25/2023 01/21/23   McDiarmid, Leighton Roach, MD  Glucose Blood (BLOOD GLUCOSE TEST STRIPS) STRP Use 1 each by  In Vitro route in the morning, at noon, and at bedtime. May substitute to any manufacturer covered by patient's insurance. 08/22/23 09/25/23  Setzer, Lynnell Jude, PA-C  Lancet Device MISC 1 each by Does not apply route in the morning, at noon, and at bedtime. May substitute to any manufacturer covered by patient's insurance. 08/22/23 09/21/23  Milinda Antis, PA-C  Lancets Misc. MISC 1 each by Does not apply route in the morning, at noon, and at bedtime. May substitute to any manufacturer covered by patient's insurance. 08/22/23 09/21/23  Setzer, Lynnell Jude, PA-C  levofloxacin (LEVAQUIN) 750 MG tablet Take 1 tablet (750 mg total) by mouth daily. For 14 days 07/07/12 07/24/12  Madolyn Frieze, Etta Quill, MD   DG Tibia/Fibula Left Result Date: 08/25/2023 CLINICAL DATA:  Questionable sepsis - evaluate for abnormality EXAM: LEFT TIBIA AND FIBULA - 2 VIEW COMPARISON:  None Available. FINDINGS: Changes of below the knee amputation. No acute bony abnormality. No joint effusion within the left knee. No soft tissue gas. The soft tissues at the stump appears swollen/edematous. No soft tissue gas. IMPRESSION: Prior left BKA.  No acute bony abnormality. Swollen/edematous soft tissues at the stump.  No soft tissue gas. Electronically Signed   By: Charlett Nose M.D.   On: 08/25/2023 19:22   DG Chest Port 1 View Result Date: 08/25/2023 CLINICAL DATA:  Questionable sepsis EXAM: PORTABLE CHEST 1 VIEW COMPARISON:  Chest x-ray 01/17/2020 FINDINGS: The heart size and mediastinal contours are within normal limits. Both lungs are clear. There are healed left upper rib fractures, unchanged. IMPRESSION: No active disease. Electronically Signed   By: Darliss Cheney M.D.   On: 08/25/2023 19:21   - pertinent xrays, CT, MRI studies were reviewed and independently interpreted  Positive ROS: All other systems have been reviewed and were otherwise negative with the exception of those mentioned in the HPI and as above.  Physical Exam: General: Alert, no  acute distress Psychiatric: Patient is competent for consent with normal mood and affect Lymphatic: No axillary or cervical lymphadenopathy Cardiovascular: No pedal edema Respiratory: No cyanosis, no use of accessory musculature GI: No organomegaly, abdomen is soft and non-tender    Images:  @ENCIMAGES @  Labs:  Lab Results  Component Value Date   HGBA1C 10.0 (H) 08/01/2023   HGBA1C 10.0 (A) 04/28/2023   HGBA1C 10.3 (A) 02/06/2023   ESRSEDRATE 54 (H) 01/01/2020   ESRSEDRATE 8 01/29/2016   ESRSEDRATE 9 04/11/2012   CRP 2.6 (H) 01/29/2016   CRP 0.5 02/28/2014   REPTSTATUS PENDING 08/26/2023   GRAMSTAIN  04/11/2012    NO WBC SEEN RARE SQUAMOUS EPITHELIAL CELLS PRESENT MODERATE GRAM POSITIVE COCCI IN PAIRS RARE GRAM NEGATIVE RODS   GRAMSTAIN  04/11/2012    NO WBC SEEN RARE SQUAMOUS EPITHELIAL CELLS PRESENT MODERATE GRAM POSITIVE COCCI IN PAIRS RARE GRAM NEGATIVE RODS   CULT  08/26/2023    NO GROWTH < 12 HOURS Performed at [facility], [address], [place], [place] [address]    LABORGA NO GROWTH 2 DAYS 07/19/2014    Lab Results  Component Value Date   ALBUMIN 3.1 (L) 08/25/2023   ALBUMIN 2.2 (L) 08/06/2023   ALBUMIN 3.0 (L) 08/01/2023   PREALBUMIN 8 (L) 08/01/2023        Latest Ref Rng & Units 08/26/2023    4:08 AM 08/26/2023   12:08 AM 08/25/2023    5:14 PM  CBC EXTENDED  WBC 4.0 - 10.5 K/uL 3.8  4.1    RBC  4.22 - 5.81 MIL/uL 2.87  3.14    Hemoglobin 13.0 - 17.0 g/dL 7.7  8.3  7.5   HCT 96.0 - 52.0 % 23.5  26.0  22.0   Platelets 150 - 400 K/uL 127  147      Neurologic: Patient does not have protective sensation bilateral lower extremities.   MUSCULOSKELETAL:   Skin: Examination patient has ischemic changes around the incision with draining hematoma and dehiscence of the transtibial amputation.  There is no ascending cellulitis.  Hemoglobin 7.7 with a white cell count of 3.8.  Hemoglobin A1c consistently 10.  Assessment: Assessment:  Uncontrolled type 2 diabetes with acute fall and dehiscence left transtibial amputation.  Plan: Plan: Will plan for revision of the left below-knee amputation on Wednesday.  Risks and benefits were discussed including risk of the wound not healing need for additional surgery.  Patient states he understands wished to proceed at this time patient and wife state they would like to discharge to skilled nursing.  Thank you for the consult and the opportunity to see Mr. Puebla  Aldean Baker, MD [facility] Orth[facility] [phone] 7:22 AM

## 2023-08-26 NOTE — Progress Notes (Signed)
 OT Cancellation Note  Patient Details Name: Adam Ray MRN: [id number] DOB: [birth year]   Cancelled Treatment:    Reason Eval/Treat Not Completed: Other (comment). Pt currently in the middle of eating his lunch, will try bask as schedule allows.  [name] OT Acute Rehabilitation Services Office [phone]    Evette Georges 08/26/2023, 12:01 PM

## 2023-08-26 NOTE — Evaluation (Signed)
 Occupational Therapy Evaluation Patient Details Name: Adam Ray MRN: [id number] DOB: [birth year] Today's Date: 08/26/2023   History of Present Illness   This 68 yo male who presents 08/25/23 for another fall on L residual limb with wound dehiscence.  Plan for L BKA revision 08/27/23. PHMx: COPD, DM, HTN, PAD, anxiety, osteomyelitis, s/p bilateral BKA's.     Clinical Impressions This 68 yo male admitted with above presents to acute OT with PLOF of only being home a short period of time from rehab before he fell and dehissed his recent BKA. He currently is setup-min A+2 safety/assistance for basic ADLs and mobility. He will continue to benefit from acute OT with follow up from continued inpatient follow up therapy, <3 hours/day.      If plan is discharge home, recommend the following:   A little help with walking and/or transfers;A little help with bathing/dressing/bathroom;Assistance with cooking/housework;Help with stairs or ramp for entrance;Assist for transportation     Functional Status Assessment   Patient has had a recent decline in their functional status and demonstrates the ability to make significant improvements in function in a reasonable and predictable amount of time.     Equipment Recommendations   Other (comment) (TBD next venue)      Precautions/Restrictions   Precautions Precautions: Fall Restrictions Weight Bearing Restrictions Per Provider Order: Yes LLE Weight Bearing Per Provider Order: Non weight bearing     Mobility Bed Mobility Overal bed mobility: Modified Independent Bed Mobility: Supine to Sit           General bed mobility comments: pt able to pull self up into long sitting with use of rail    Transfers Overall transfer level: Needs assistance   Transfers: Bed to chair/wheelchair/BSC         Anterior-Posterior transfers: +2 physical assistance, Min assist   General transfer comment: Patient unable to use bilateral LE  support to assist with transfer due to LLE NWB and RLE prosthesis not present. Min A to elevate LE's, CGA at trunk for safety with bkwds scooting. One LOB bkwds with mod A to correct      Balance Overall balance assessment: Needs assistance Sitting-balance support: Feet unsupported, Bilateral upper extremity supported Sitting balance-Leahy Scale: Fair   Postural control: Posterior lean                                 ADL either performed or assessed with clinical judgement   ADL Overall ADL's : Needs assistance/impaired Eating/Feeding: Independent;Sitting   Grooming: Set up;Sitting   Upper Body Bathing: Set up;Sitting   Lower Body Bathing: Set up;Supervison/ safety;Bed level   Upper Body Dressing : Set up;Sitting   Lower Body Dressing: Moderate assistance;Bed level   Toilet Transfer: Minimal assistance;+2 for safety/equipment;+2 for physical assistance Toilet Transfer Details (indicate cue type and reason): posterior transfer from bed to recliner; A to not put weight through LLE Toileting- Clothing Manipulation and Hygiene: Minimal assistance;Sitting/lateral lean               Vision Baseline Vision/History: 1 Wears glasses Ability to See in Adequate Light: 2 Moderately impaired Patient Visual Report: No change from baseline              Pertinent Vitals/Pain Pain Assessment Pain Assessment: No/denies pain     Extremity/Trunk Assessment Upper Extremity Assessment Upper Extremity Assessment: Overall WFL for tasks assessed   Lower Extremity Assessment Lower Extremity Assessment: LLE  deficits/detail RLE Deficits / Details: BKA bleeding through bandages. PT/OT assisted RN with adding dressing to residual limb. RLE Sensation: WNL RLE Coordination: decreased gross motor LLE Deficits / Details: BKA LLE: Unable to fully assess due to immobilization LLE Sensation: WNL LLE Coordination: decreased gross motor   Cervical / Trunk Assessment Cervical /  Trunk Assessment: Normal   Communication Communication Communication: Impaired Factors Affecting Communication:  (sometimes difficult to understand)   Cognition Arousal: Alert Behavior During Therapy: WFL for tasks assessed/performed                                 Following commands: Intact       Cueing   Cueing Techniques: Verbal cues              Home Living Family/patient expects to be discharged to:: Skilled nursing facility Living Arrangements: Spouse/significant other Available Help at Discharge: Family;Available PRN/intermittently (wife works 8-12 Mon-Fri for [employer] school system) Type of Home: House Home Access: Ramped entrance     Home Layout: One level     Bathroom Shower/Tub: IT trainer: Standard Bathroom Accessibility: Yes   Home Equipment: Agricultural consultant (2 wheels);Cane - single point;Electric scooter   Additional Comments: Pt reports being mod I using R prosthetic and occasional use of RW inside home. Used Art gallery manager for [employer]. Has w/c (without leg rests & > 5 years ago), RW, SPC, shower chair, had BSC but lost it. Now planning to go to SNF for longer rehab  Lives With: Spouse    Prior Functioning/Environment Prior Level of Function : Needs assist       Physical Assist : ADLs (physical)   ADLs (physical): Feeding;Grooming;Bathing;Dressing;Toileting;IADLs        OT Problem List: Impaired balance (sitting and/or standing)   OT Treatment/Interventions: Self-care/ADL training;DME and/or AE instruction;Therapeutic activities;Balance training;Therapeutic exercise;Patient/family education      OT Goals(Current goals can be found in the care plan section)   Acute Rehab OT Goals Patient Stated Goal: to get stronger and safer by going to skilled nursing for rehab before going home OT Goal Formulation: With patient Time For Goal Achievement: 09/08/23 Potential to Achieve  Goals: Good   OT Frequency:  Min 1X/week    Co-evaluation PT/OT/SLP Co-Evaluation/Treatment: Yes Reason for Co-Treatment: For patient/therapist safety PT goals addressed during session: Mobility/safety with mobility;Balance OT goals addressed during session: ADL's and self-care;Strengthening/ROM      AM-PAC OT "6 Clicks" Daily Activity     Outcome Measure Help from another person eating meals?: None Help from another person taking care of personal grooming?: A Little Help from another person toileting, which includes using toliet, bedpan, or urinal?: A Little Help from another person bathing (including washing, rinsing, drying)?: A Little Help from another person to put on and taking off regular upper body clothing?: A Little Help from another person to put on and taking off regular lower body clothing?: A Lot 6 Click Score: 18   End of Session Nurse Communication: Mobility status  Activity Tolerance: Patient tolerated treatment well Patient left: in chair;with call bell/phone within reach  OT Visit Diagnosis: Other abnormalities of gait and mobility (R26.89);Muscle weakness (generalized) (M62.81)                Time: 4098-1191 OT Time Calculation (min): 16 min Charges:  OT General Charges $OT Visit: 1 Visit OT Evaluation $OT Eval Moderate Complexity: 1 Mod  [name] OT Acute Rehabilitation Services Office [phone]    Evette Georges 08/26/2023, 3:52 PM

## 2023-08-26 NOTE — Assessment & Plan Note (Addendum)
 Appears new from EKG 06/24. QRS duration prolonged, 130. No chest pain, SOB, dizziness to suggest ACS. QTc 513. - Will consider repeat troponins/EKG should he develop new symptomology - Avoid QTc prolonging medications where possible

## 2023-08-26 NOTE — Assessment & Plan Note (Addendum)
 S/p 2uPRBCs. No longer bleeding. - Follow-up CBC q8h - Transfusion threshold <8.0 hgb in setting of CAD

## 2023-08-26 NOTE — H&P (View-Only) (Signed)
 ORTHOPAEDIC CONSULTATION  REQUESTING PHYSICIAN: Nestor Ramp, MD  Chief Complaint: Fall with dehiscence left below-knee amputation.  HPI: Adam Ray is a 68 y.o. male who presents with dehiscence left below-knee amputation.  Patient states that he was in inpatient rehab states that he and his wife did not feel safe for discharge to home and after discharge to home patient fell at home sustaining dehiscence of the left below-knee amputation and blood loss.  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   Status post transmetatarsal amputation of foot, 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/2018   STEMI involving left circumflex coronary artery (HCC) 07/12/2018   Coronary artery disease   Subacute osteomyelitis, right ankle and foot (HCC) 01/29/2016   Testicular mass 04/18/2016   Past Surgical History:  Procedure Laterality Date   AMPUTATION Right 01/31/2016   Procedure: Right 2nd Toe Amputation;  Surgeon: [name], MD;  Location: [facility] OR;  Service: Orthopedics;  Laterality: Right;   AMPUTATION Right 07/08/2018   Procedure: RIGHT TRANSMETATARSAL AMPUTATION;  Surgeon: [name], MD;  Location: [facility] OR;  Service: Orthopedics;  Laterality: Right;   AMPUTATION Right 01/05/2020   Procedure: RIGHT BELOW KNEE AMPUTATION;  Surgeon: [name], MD;  Location: [facility] OR;  Service: Orthopedics;  Laterality: Right;   AMPUTATION Right 02/23/2020   Procedure: RIGHT BELOW KNEE AMPUTATION  REVISION;  Surgeon: [name], MD;  Location: [facility] OR;  Service: Orthopedics;  Laterality: Right;   AMPUTATION Left 10/11/2022   Procedure: LEFT GREAT TOE AMPUTATION AND LEFT 2ND TOE AMPUTATION;  Surgeon: [name], MD;  Location: [facility] OR;  Service: Orthopedics;  Laterality: Left;   AMPUTATION Left 12/25/2022   Procedure: LEFT TRANSMETATARSAL AMPUTATION;  Surgeon: [name], MD;  Location: [facility] OR;  Service: Orthopedics;  Laterality: Left;   AMPUTATION Left 08/01/2023   Procedure: LEFT BELOW KNEE AMPUTATION;  Surgeon: [name], MD;  Location: [facility] OR;  Service: Orthopedics;  Laterality: Left;   CATARACT EXTRACTION     right eye   COLONOSCOPY     CORONARY STENT INTERVENTION N/A 07/12/2018   Procedure: CORONARY STENT INTERVENTION;  Surgeon: Lennette Bihari, MD;  Location: [facility] INVASIVE CV LAB;  Service: Cardiovascular;  Laterality: N/A;   CORONARY/GRAFT ACUTE MI REVASCULARIZATION N/A 07/12/2018   Procedure: Coronary/Graft Acute MI Revascularization;  Surgeon: Lennette Bihari, MD;  Location: [facility] INVASIVE CV LAB;  Service: Cardiovascular;  Laterality: N/A;   I & D EXTREMITY  04/11/2012   Procedure: IRRIGATION AND DEBRIDEMENT EXTREMITY;  Surgeon: Toni Arthurs, MD;  Location: [facility] OR;  Service: Orthopedics;  Laterality: Right;   LEFT HEART CATH AND CORONARY ANGIOGRAPHY N/A 07/12/2018   Procedure: LEFT HEART CATH AND CORONARY ANGIOGRAPHY;  Surgeon: Lennette Bihari, MD;  Location: [facility] INVASIVE CV LAB;  Service: Cardiovascular;  Laterality: N/A;   Surgery left great toe     Tear ducts bilateral eyes     TRANSMETATARSAL AMPUTATION Right 07/08/2018   Social History   Socioeconomic History   Marital status: Married    Spouse name: Marylene Land   Number of children: 3   Years of education: Not on file   Highest education level: Not on file  Occupational History   Occupation: disabled  Tobacco Use   Smoking status: Former    Current packs/day: 0.00    Average packs/day: 0.3 packs/day for 55.9 years  (16.8 ttl pk-yrs)    Types: Cigars, Cigarettes    Start date: 07/02/1963    Quit date: 06/01/2019    Years since quitting: 4.2    Passive exposure: Past   Smokeless tobacco: Former  Building services engineer status: Former  Substance and Sexual Activity   Alcohol use: Not Currently   Drug use: No   Sexual activity: Not Currently  Other Topics Concern   Not on file  Social History Narrative   Not on file   Social Drivers of Health   Financial Resource Strain: Medium Risk (08/23/2022)   Overall Financial Resource Strain (CARDIA)    Difficulty of Paying Living Expenses: Somewhat hard  Food Insecurity: No Food Insecurity (08/25/2023)   Hunger Vital Sign    Worried About Running Out of Food in the Last Year: Never true    Ran Out of Food in the Last Year: Never true  Transportation Needs: Unmet Transportation Needs (08/25/2023)   PRAPARE - Transportation    Lack of Transportation (Medical): Yes    Lack of Transportation (Non-Medical): Yes  Physical Activity: Insufficiently Active (08/23/2022)   Exercise Vital Sign    Days of Exercise per Week: 2 days    Minutes of Exercise per Session: 60 min  Stress: No Stress Concern Present (08/23/2022)   [employer] of Occupational Health - Occupational Stress Questionnaire    Feeling of Stress : Not at all  Social Connections: Moderately Integrated (08/25/2023)   Social Connection and Isolation Panel [NHANES]    Frequency of Communication with Friends and Family: Twice a week    Frequency of Social Gatherings with Friends and Family: Never    Attends Religious Services: More than 4 times per year    Active Member of [employer] or Organizations: Yes    Attends Banker Meetings: 1 to 4 times per year    Marital Status: Married   Family History  Problem Relation Age of Onset   Diabetes Mother    Stroke Mother    Heart failure Father    Heart attack Father    Diabetes Sister        multiple siblings   Diabetes Brother        muliple  siblings   Heart disease Brother    Colon cancer Neg Hx    Esophageal cancer Neg Hx    Rectal cancer Neg Hx    Stomach cancer Neg Hx    - negative except otherwise stated in the family history section Allergies  Allergen Reactions   Codeine Other (See Comments)    Heart attack.   Hydrocodone Other (See Comments)    hallucinations   Latex Hives and Itching   Oxycodone Other (See Comments)    hallucinations   Morphine Other (See Comments)    Patient preference   Propofol Other (See Comments)    Patient preference   Prior to Admission medications  Medication Sig Start Date End Date Taking? Authorizing Provider  acetaminophen (TYLENOL) 325 MG tablet Take 1-2 tablets (325-650 mg total) by mouth every 6 (six) hours as needed. 08/22/23  Yes Setzer, Lynnell Jude, PA-C  ascorbic acid (VITAMIN C) 1000 MG tablet Take 1 tablet (1,000 mg total) by mouth daily. 08/22/23  Yes Setzer, Lynnell Jude, PA-C  aspirin 81 MG chewable tablet Chew 81 mg by mouth daily.   Yes [provider]  CINNAMON PO Take 1,200 mg by mouth 2 (two) times daily. [place]   Yes [provider]  cyanocobalamin (VITAMIN B12) 1000 MCG/ML injection Inject 1 mL (1,000 mcg total) into the muscle every 30 (thirty) days. 08/22/23  Yes Setzer, Lynnell Jude, PA-C  cyclobenzaprine (FLEXERIL) 5 MG tablet Take 1 tablet (5 mg total) by mouth 3 (three) times daily as needed for muscle spasms. 08/22/23  Yes Setzer, Lynnell Jude, PA-C  Dulaglutide (TRULICITY) 0.75 MG/0.5ML SOAJ Inject 0.75 mg into the skin every 30 (thirty) days.   Yes [provider]  famotidine (PEPCID) 20 MG tablet TAKE 1 TABLET (20 MG TOTAL) BY MOUTH 2 (TWO) TIMES DAILY AS NEEDED FOR HEARTBURN OR INDIGESTION. Patient taking differently: Take 20 mg by mouth daily. 07/14/23  Yes Latrelle Dodrill, MD  finasteride (PROSCAR) 5 MG tablet Take 5 mg by mouth daily. 01/11/23  Yes [provider]  insulin glargine (LANTUS SOLOSTAR) 100 UNIT/ML Solostar Pen Inject  23 Units into the skin daily. 08/22/23  Yes Setzer, Lynnell Jude, PA-C  metFORMIN (GLUCOPHAGE) 850 MG tablet Take 1 tablet (850 mg total) by mouth 2 (two) times daily with a meal. 08/22/23  Yes Setzer, Lynnell Jude, PA-C  Multiple Vitamin (MULTIVITAMIN WITH MINERALS) TABS tablet Take 1 tablet by mouth daily.  iron   Yes [provider]  rosuvastatin (CRESTOR) 40 MG tablet Take 1 tablet (40 mg total) by mouth daily at 6 PM. Patient taking differently: Take 20 mg by mouth at bedtime. 05/15/20  Yes Mirian Mo, MD  tamsulosin (FLOMAX) 0.4 MG CAPS capsule Take 1 capsule (0.4 mg total) by mouth daily. 11/09/21  Yes Latrelle Dodrill, MD  tolterodine (DETROL LA) 4 MG 24 hr capsule Take 4 mg by mouth daily. 10/13/22  Yes [provider]  traMADol (ULTRAM) 50 MG tablet Take 1-2 tablets (50-100 mg total) by mouth every 6 (six) hours as needed for severe pain (pain score 7-10). 08/22/23  Yes Setzer, Lynnell Jude, PA-C  Vibegron (GEMTESA) 75 MG TABS Take 75 mg by mouth daily.   Yes [provider]  vitamin D3 (CHOLECALCIFEROL) 25 MCG tablet Take 1 tablet (1,000 Units total) by mouth daily. 08/22/23  Yes Setzer, Lynnell Jude, PA-C  Accu-Chek Softclix Lancets lancets Use as directed. 08/22/23   Setzer, Lynnell Jude, PA-C  Blood Glucose Monitoring Suppl (BLOOD GLUCOSE MONITOR SYSTEM) w/Device KIT Use in the morning, at noon, and at bedtime. 08/22/23   Setzer, Lynnell Jude, PA-C  Continuous Glucose Receiver (FREESTYLE LIBRE 3 READER) DEVI 1 each by Does not apply route as directed. Use with sensor to monitor blood sugar Patient not taking: Reported on 08/25/2023 11/26/22   McDiarmid, Leighton Roach, MD  Continuous Glucose Sensor (FREESTYLE LIBRE 3 SENSOR) MISC 1 each by Does not apply route every 14 (fourteen) days. Place 1 sensor on the skin every 14 days. Use to check glucose continuously Patient not taking: Reported on 08/25/2023 01/21/23   McDiarmid, Leighton Roach, MD  Glucose Blood (BLOOD GLUCOSE TEST STRIPS) STRP Use 1 each by  In Vitro route in the morning, at noon, and at bedtime. May substitute to any manufacturer covered by patient's insurance. 08/22/23 09/25/23  Setzer, Lynnell Jude, PA-C  Lancet Device MISC 1 each by Does not apply route in the morning, at noon, and at bedtime. May substitute to any manufacturer covered by patient's insurance. 08/22/23 09/21/23  Milinda Antis, PA-C  Lancets Misc. MISC 1 each by Does not apply route in the morning, at noon, and at bedtime. May substitute to any manufacturer covered by patient's insurance. 08/22/23 09/21/23  Setzer, Lynnell Jude, PA-C  levofloxacin (LEVAQUIN) 750 MG tablet Take 1 tablet (750 mg total) by mouth daily. For 14 days 07/07/12 07/24/12  Madolyn Frieze, Etta Quill, MD   DG Tibia/Fibula Left Result Date: 08/25/2023 CLINICAL DATA:  Questionable sepsis - evaluate for abnormality EXAM: LEFT TIBIA AND FIBULA - 2 VIEW COMPARISON:  None Available. FINDINGS: Changes of below the knee amputation. No acute bony abnormality. No joint effusion within the left knee. No soft tissue gas. The soft tissues at the stump appears swollen/edematous. No soft tissue gas. IMPRESSION: Prior left BKA.  No acute bony abnormality. Swollen/edematous soft tissues at the stump.  No soft tissue gas. Electronically Signed   By: Charlett Nose M.D.   On: 08/25/2023 19:22   DG Chest Port 1 View Result Date: 08/25/2023 CLINICAL DATA:  Questionable sepsis EXAM: PORTABLE CHEST 1 VIEW COMPARISON:  Chest x-ray 01/17/2020 FINDINGS: The heart size and mediastinal contours are within normal limits. Both lungs are clear. There are healed left upper rib fractures, unchanged. IMPRESSION: No active disease. Electronically Signed   By: Darliss Cheney M.D.   On: 08/25/2023 19:21   - pertinent xrays, CT, MRI studies were reviewed and independently interpreted  Positive ROS: All other systems have been reviewed and were otherwise negative with the exception of those mentioned in the HPI and as above.  Physical Exam: General: Alert, no  acute distress Psychiatric: Patient is competent for consent with normal mood and affect Lymphatic: No axillary or cervical lymphadenopathy Cardiovascular: No pedal edema Respiratory: No cyanosis, no use of accessory musculature GI: No organomegaly, abdomen is soft and non-tender    Images:  @ENCIMAGES @  Labs:  Lab Results  Component Value Date   HGBA1C 10.0 (H) 08/01/2023   HGBA1C 10.0 (A) 04/28/2023   HGBA1C 10.3 (A) 02/06/2023   ESRSEDRATE 54 (H) 01/01/2020   ESRSEDRATE 8 01/29/2016   ESRSEDRATE 9 04/11/2012   CRP 2.6 (H) 01/29/2016   CRP 0.5 02/28/2014   REPTSTATUS PENDING 08/26/2023   GRAMSTAIN  04/11/2012    NO WBC SEEN RARE SQUAMOUS EPITHELIAL CELLS PRESENT MODERATE GRAM POSITIVE COCCI IN PAIRS RARE GRAM NEGATIVE RODS   GRAMSTAIN  04/11/2012    NO WBC SEEN RARE SQUAMOUS EPITHELIAL CELLS PRESENT MODERATE GRAM POSITIVE COCCI IN PAIRS RARE GRAM NEGATIVE RODS   CULT  08/26/2023    NO GROWTH < 12 HOURS Performed at [facility], [address], [place], [place] [address]    LABORGA NO GROWTH 2 DAYS 07/19/2014    Lab Results  Component Value Date   ALBUMIN 3.1 (L) 08/25/2023   ALBUMIN 2.2 (L) 08/06/2023   ALBUMIN 3.0 (L) 08/01/2023   PREALBUMIN 8 (L) 08/01/2023        Latest Ref Rng & Units 08/26/2023    4:08 AM 08/26/2023   12:08 AM 08/25/2023    5:14 PM  CBC EXTENDED  WBC 4.0 - 10.5 K/uL 3.8  4.1    RBC  4.22 - 5.81 MIL/uL 2.87  3.14    Hemoglobin 13.0 - 17.0 g/dL 7.7  8.3  7.5   HCT 96.0 - 52.0 % 23.5  26.0  22.0   Platelets 150 - 400 K/uL 127  147      Neurologic: Patient does not have protective sensation bilateral lower extremities.   MUSCULOSKELETAL:   Skin: Examination patient has ischemic changes around the incision with draining hematoma and dehiscence of the transtibial amputation.  There is no ascending cellulitis.  Hemoglobin 7.7 with a white cell count of 3.8.  Hemoglobin A1c consistently 10.  Assessment: Assessment:  Uncontrolled type 2 diabetes with acute fall and dehiscence left transtibial amputation.  Plan: Plan: Will plan for revision of the left below-knee amputation on Wednesday.  Risks and benefits were discussed including risk of the wound not healing need for additional surgery.  Patient states he understands wished to proceed at this time patient and wife state they would like to discharge to skilled nursing.  Thank you for the consult and the opportunity to see Mr. Puebla  Aldean Baker, MD [facility] [phone] 7:22 AM

## 2023-08-26 NOTE — Discharge Instructions (Addendum)
Dear [name],   Thank you so much for allowing [place] to be part of your care!  You were admitted to [facility] after a fall at home that resulted in a re-opening of your wound at the site of your recent amputation. This was repaired in the OR and **   POST-HOSPITAL & CARE INSTRUCTIONS ** Please let PCP/Specialists know of any changes that were made.  Please see medications section of this packet for any medication changes.   DOCTOR'S APPOINTMENT & FOLLOW UP CARE INSTRUCTIONS  Future Appointments  Date Time Provider Department Center  09/16/2023  3:00 PM Raulkar, Drema Pry, MD [facility] CPR    RETURN PRECAUTIONS:   Take care and be well!  Family Medicine Teaching Service  [place]  [facility] [place]  [address] [place], [place] [address] [phone]

## 2023-08-26 NOTE — TOC CM/SW Note (Signed)
 Transition of Care [facility]) - Inpatient Brief Assessment   Patient Details  Name: Adam Ray MRN: [id number] Date of Birth: 1956-04-16  Transition of Care [facility]) CM/SW Contact:    Tom-Johnson, Hershal Coria, RN Phone Number: 08/26/2023, 1:35 PM   Clinical Narrative:  Patient presented to the ED after a Fall at home with Dehiscence to recent Lt BKA. Patient was discharged from CIR on 08/23/23. Dr. Lajoyce Corners with Ortho consulted and following.  Plan for revision of the Lt BKA tomorrow Wednesday 08/27/23. Has hx of Rt BKA in 01/05/20.  From home with wife, four children and six siblings supportive. Patient has necessary DME through the [place] at discharge from CIR. PCP is Latrelle Dodrill, MD and uses [facility] on [address].  Active with [facility] for [facility] disciplines. Resumption of care order placed, info on AVS.   Patient not Medically ready for discharge.  CM will continue to follow as patient progresses with care towards discharge.              Transition of Care Asessment: Insurance and Status: Insurance coverage has been reviewed Patient has primary care physician: Yes Home environment has been reviewed: Yes Prior level of function:: Modified Independent   Social Drivers of Health Review: SDOH reviewed no interventions necessary Readmission risk has been reviewed: Yes Transition of care needs: transition of care needs identified, TOC will continue to follow

## 2023-08-26 NOTE — Assessment & Plan Note (Signed)
 1.3 on admission. - S/p 2 g IV mag. - Follow-up mag in a.m. Replete if <2.0.

## 2023-08-26 NOTE — Progress Notes (Cosign Needed Addendum)
 Daily Progress Note Intern Pager: [phone]  Patient name: Adam Ray Medical record number: [id number] Date of birth: February 15, 1956 Age: 68 y.o. Gender: male  Primary Care Provider: Latrelle Dodrill, MD Consultants: Ortho Code Status: Full   Pt Overview and Major Events to Date:  02/24: admitted, s/p 1uPRBC 02/25: s/p 1uPRBC   Assessment and Plan:  Adam Ray is a 68 y.o. male who presented s/p mechanical fall with entrapment of recent left BKA that precipitated dehiscence and moderate blood loss. Pertinent PMH/PSH includes PAD, T2DM, prior osteomyelitis, COPD, HTN, HLD, CAD, BPH. 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  Assessment & Plan Dehiscence of amputation stump of left lower extremity (HCC) Secondary to fall from deconditioning and failure to properly transfer iso of recent surgery for L BKA. XR of the extremity without acute fracture. Picture in media of wound. - Ortho following, appreciate recs.  - OR on 2/26 per Dr. Lajoyce Corners for flap revision. NPO at midnight - Defer IV antibiotics for now, CTM for infectious symptomatology - Continue home tramadol 50 mg every 6 hours for pain control - Start Tylenol 1000 mg every 6 hours for pain control - Trend CBCs, BMPs - PT/OT to evaluate - Will add DVT prophylaxis postop, continue ASA at preference of surgeon Acute blood loss anemia S/p 2uPRBCs. No longer bleeding. - Follow-up CBC q8h - Transfusion threshold <8.0 hgb in setting of CAD Type 2 diabetes mellitus (HCC) A1c 10%. On Lantus 23u daily at home, as well as Metformin, Trulicity. Glucose remains in the low 200s since admission.  - Start moderate SSI and add back home regimen as appropriate Left bundle branch block (LBBB) Appears new from EKG 06/24. QRS duration prolonged, 130.  No chest pain, SOB, dizziness to suggest ACS. QTc 513. - Will consider repeat troponins/EKG should he develop new symptomology - Avoid QTc prolonging medications where  possible Hypomagnesemia (Resolved: 08/26/2023) 1.3 on admission. S/p 2 g IV mag. - Monitor Mg   Chronic and Stable Issues: COPD: Clinically silent. No SOB. HTN: BPs WNL. CTM. PAD: Continue home aspirin, Crestor 40 mg. Hx of osteomyelitis: X-ray negative for obvious infectious process. Could do further imaging/biopsy if indicated. BPH: Continue home finasteride, flomax.  FEN/GI: heart healthy, NPO at midnight  PPx: Home aspirin (Lovenox 30 mL BID attempted during recent inpatient rehab stay, was discontinued d/t bleeding.)  Dispo:Pending PT recommendations   following flap revision . Barriers include need for flap revision with ortho.   Subjective:  NAEON. Reports his pain has improved to a 6/10 this AM (was 10+ on admission). No other acute symptoms or concerns at this time. Denies chest pain, shortness of breath, dizziness.    Objective: Temp:  [98.2 F (36.8 C)-98.4 F (36.9 C)] 98.3 F (36.8 C) (02/25 0639) Pulse Rate:  [72-104] 72 (02/25 0639) Resp:  [16-25] 18 (02/25 0639) BP: (94-126)/(55-75) 105/56 (02/25 0639) SpO2:  [94 %-100 %] 97 % (02/25 0639) Weight:  [77.1 kg] 77.1 kg (02/24 1632) Physical Exam: General: Resting comfortably in bed. NAD.  Cardiovascular: RRR. Systolic ejection murmur. No rubs, gallops.  Respiratory: No iWOB. Good air movement bilaterally. No wheezes, rales, or rhonchi.   Abdomen: Soft, NTND.  Extremities: s/p bilateral BKA. Left BKA bandaged but no overlying erythema or skin changes over the stump.    Laboratory: Most recent CBC Lab Results  Component Value Date   WBC 3.8 (L) 08/26/2023   HGB 7.7 (L) 08/26/2023   HCT 23.5 (L) 08/26/2023  MCV 81.9 08/26/2023   PLT 127 (L) 08/26/2023   Most recent BMP    Latest Ref Rng & Units 08/26/2023   12:08 AM  BMP  Glucose 70 - 99 mg/dL 098   BUN 8 - 23 mg/dL 19   Creatinine 1.19 - 1.24 mg/dL 1.47   Sodium 829 - 562 mmol/L 135   Potassium 3.5 - 5.1 mmol/L 4.1   Chloride 98 - 111 mmol/L 100    CO2 22 - 32 mmol/L 24   Calcium 8.9 - 10.3 mg/dL 8.5     Other pertinent labs:  -lipid panel with mild reduction in HDL, otherwise stable  -Mg normalized s/p repletion  -UA unremarkable  -Repeat lactic normalized   Imaging/Diagnostic Tests: DG tib/fib left (02/24):  IMPRESSION: Prior left BKA.  No acute bony abnormality. Swollen/edematous soft tissues at the stump.  No soft tissue gas.  CXR (02/24):  IMPRESSION: No active disease.  Lucia Estelle, Medical Student 08/26/2023, 7:42 AM [facility] FPTS Intern pager: [phone], text pages welcome Secure chat group [facility] Teaching Service   Upper Level Addendum:   I have seen and evaluated this patient along with Student Doctor Theo Dills and reviewed the above note, making necessary revisions as appropriate.  I agree with the medical decision making and physical exam as noted above.   Elberta Fortis, DO PGY-2, [facility] Residency

## 2023-08-26 NOTE — Evaluation (Signed)
 Physical Therapy Evaluation Patient Details Name: Adam Ray MRN: [id number] DOB: 11/25/1955 Today's Date: 08/26/2023  History of Present Illness  This 68 yo male who presents 08/25/23 for another fall on L residual limb with wound dehiscence.  Plan for L BKA revision 08/27/23. PHMx: COPD, DM, HTN, PAD, anxiety, osteomyelitis, s/p bilateral BKA's.  Clinical Impression  Pt admitted with above diagnosis. Pt received in bed, thinks prosthesis in room but it was not. Pt called wife and she reminded him it did not come to hospital. Pt asked her to bring it tomorrow. Pt with drainage through bandaging LLE and reinforcement added. Min A +2 needed for pt to scoot bkwds into recliner from bed. Due to recent events patient will benefit from continued inpatient follow up therapy, <3 hours/day.  Pt currently with functional limitations due to the deficits listed below (see PT Problem List). Pt will benefit from acute skilled PT to increase their independence and safety with mobility to allow discharge.           If plan is discharge home, recommend the following: A lot of help with walking and/or transfers;A lot of help with bathing/dressing/bathroom;Assistance with cooking/housework;Assist for transportation;Help with stairs or ramp for entrance   Can travel by private vehicle   No    Equipment Recommendations None recommended by PT  Recommendations for Other Services       Functional Status Assessment Patient has had a recent decline in their functional status and demonstrates the ability to make significant improvements in function in a reasonable and predictable amount of time.     Precautions / Restrictions Precautions Precautions: Fall Restrictions Weight Bearing Restrictions Per Provider Order: Yes LLE Weight Bearing Per Provider Order: Non weight bearing      Mobility  Bed Mobility Overal bed mobility: Modified Independent Bed Mobility: Supine to Sit     Supine to sit: Contact  guard, Used rails, HOB elevated     General bed mobility comments: pt able to pull self up into long sitting with use of rail    Transfers Overall transfer level: Needs assistance Equipment used: None Transfers: Bed to chair/wheelchair/BSC         Anterior-Posterior transfers: +2 physical assistance, Min assist   General transfer comment: Patient unable to use bilateral LE support to assist with transfer due to LLE NWB and RLE prosthesis not present. Min A to elevate LE's, CGA at trunk for safety with bkwds scooting. One LOB bkwds with mod A to correct    Ambulation/Gait               General Gait Details: prosthesis not in hospital, pt called wife and requested she bring it tomorrow  Stairs            Wheelchair Mobility     Tilt Bed    Modified Rankin (Stroke Patients Only)       Balance Overall balance assessment: Needs assistance Sitting-balance support: Feet unsupported, Bilateral upper extremity supported Sitting balance-Leahy Scale: Fair   Postural control: Posterior lean                                   Pertinent Vitals/Pain Pain Assessment Pain Assessment: Faces Faces Pain Scale: No hurt Breathing: normal Negative Vocalization: none Facial Expression: smiling or inexpressive Body Language: relaxed Consolability: no need to console PAINAD Score: 0 Pain Location: Pt grunting with mobility, when asked if he was in  pain he stated no    Home Living Family/patient expects to be discharged to:: Skilled nursing facility Living Arrangements: Spouse/significant other Available Help at Discharge: Family;Available PRN/intermittently (wife works 8-12 Mon-Fri for [employer] school system) Type of Home: House Home Access: Ramped entrance       Home Layout: One level Home Equipment: Agricultural consultant (2 wheels);Cane - single point;Electric scooter Additional Comments: Pt reports being mod I using R prosthetic and occasional use  of RW inside home. Used Art gallery manager for [employer]. Has w/c (without leg rests & > 5 years ago), RW, SPC, shower chair, had BSC but lost it. Now planning to go to SNF for longer rehab    Prior Function Prior Level of Function : Needs assist       Physical Assist : ADLs (physical)   ADLs (physical): Feeding;Grooming;Bathing;Dressing;Toileting;IADLs         Extremity/Trunk Assessment   Upper Extremity Assessment Upper Extremity Assessment: Defer to OT evaluation    Lower Extremity Assessment Lower Extremity Assessment: LLE deficits/detail RLE Deficits / Details: BKA bleeding through bandages. PT/OT assisted RN with adding dressing to residual limb. RLE Sensation: WNL RLE Coordination: decreased gross motor LLE Deficits / Details: BKA LLE: Unable to fully assess due to immobilization LLE Sensation: WNL LLE Coordination: decreased gross motor    Cervical / Trunk Assessment Cervical / Trunk Assessment: Normal  Communication   Communication Communication: Impaired Factors Affecting Communication: Other (comment) (sometimes difficult to understand)    Cognition Arousal: Alert Behavior During Therapy: WFL for tasks assessed/performed   PT - Cognitive impairments: Memory                       PT - Cognition Comments: pt thought he was brought in with his prosthesis but it wasn't in room but when he called his wife she reminded him that it was left at home Following commands: Intact       Cueing Cueing Techniques: Verbal cues     General Comments General comments (skin integrity, edema, etc.): VSS on RA.    Exercises     Assessment/Plan    PT Assessment Patient needs continued PT services  PT Problem List Decreased strength;Decreased activity tolerance;Decreased balance;Decreased mobility;Decreased coordination;Pain;Decreased range of motion       PT Treatment Interventions DME instruction;Gait training;Functional mobility  training;Therapeutic activities;Therapeutic exercise;Balance training;Neuromuscular re-education;Manual techniques    PT Goals (Current goals can be found in the Care Plan section)  Acute Rehab PT Goals Patient Stated Goal: LLE healing PT Goal Formulation: With patient Time For Goal Achievement: 09/09/23 Potential to Achieve Goals: Good    Frequency Min 1X/week     Ray-evaluation PT/OT/SLP Ray-Evaluation/Treatment: Yes Reason for Ray-Treatment: For patient/therapist safety PT goals addressed during session: Mobility/safety with mobility;Balance         AM-PAC PT "6 Clicks" Mobility  Outcome Measure Help needed turning from your back to your side while in a flat bed without using bedrails?: A Lot Help needed moving from lying on your back to sitting on the side of a flat bed without using bedrails?: A Lot Help needed moving to and from a bed to a chair (including a wheelchair)?: A Lot Help needed standing up from a chair using your arms (e.g., wheelchair or bedside chair)?: Total Help needed to walk in hospital room?: Total Help needed climbing 3-5 steps with a railing? : Total 6 Click Score: 9    End of Session   Activity Tolerance: Patient tolerated  treatment well Patient left: with call bell/phone within reach;in chair;with chair alarm set;Other (comment) (with LE's on bed) Nurse Communication: Mobility status PT Visit Diagnosis: Other abnormalities of gait and mobility (R26.89);Muscle weakness (generalized) (M62.81);Difficulty in walking, not elsewhere classified (R26.2);Pain Pain - Right/Left: Left Pain - part of body: Leg    Time: 4098-1191 PT Time Calculation (min) (ACUTE ONLY): 23 min   Charges:   PT Evaluation $PT Eval Moderate Complexity: 1 Mod   PT General Charges $$ ACUTE PT VISIT: 1 Visit         Adam Ray, PT  Acute Rehab Services Secure chat preferred Office [phone]   Adam Ray 08/26/2023, 2:54 PM

## 2023-08-26 NOTE — Assessment & Plan Note (Addendum)
 A1c 10%. On Lantus 23u daily at home, as well as Metformin, Trulicity. Glucose remains in the low 200s since admission.  - Start moderate SSI and add back home regimen as appropriate

## 2023-08-26 NOTE — Assessment & Plan Note (Addendum)
 Secondary to fall from deconditioning and failure to properly transfer iso of recent surgery for L BKA. XR of the extremity without acute fracture. Picture in media of wound. - Ortho following, appreciate recs.  - OR on 2/26 per Dr. Lajoyce Corners for flap revision. NPO at midnight - Defer IV antibiotics for now, CTM for infectious symptomatology - Continue home tramadol 50 mg every 6 hours for pain control - Start Tylenol 1000 mg every 6 hours for pain control - Trend CBCs, BMPs - PT/OT to evaluate - Will add DVT prophylaxis postop, continue ASA at preference of surgeon

## 2023-08-26 NOTE — Assessment & Plan Note (Signed)
 Per EMS, likely lost 300 to 400 mL on initial fall. Clot formed, bleeding now staunched and compression applied. - S/p 1 unit pRBC - Follow-up H&H. Replete Hgb <8.0 in setting of CAD.

## 2023-08-26 NOTE — Assessment & Plan Note (Addendum)
 1.3 on admission. S/p 2 g IV mag. - Monitor Mg

## 2023-08-26 NOTE — Assessment & Plan Note (Signed)
 Likely secondary to fall from deconditioning and failure to properly transfer iso of recent surgery for L BKA.  Very low suspicion for infectious process at this time based on observations above. - Admit to [facility] Medicine Teaching Service (Dr. Jennette Kettle attending). - To be seen by orthopedic service in a.m. 2/24, tentatively plan for OR on 2/26 per Lajoyce Corners for flap revision - Defer IV antibiotics for now, CTM for infectious symptomatology.  - Continue home tramadol 50 mg every 6 hours for pain control. - Start Tylenol 1000 mg every 6 hours for pain control. - Trend CBCs, BMPs, repeat Lactic Acid.  - PT/OT consult placed.

## 2023-08-26 NOTE — Assessment & Plan Note (Signed)
 A1c 10%. Glucose 209 on admission.  - Start moderate SSI and add back home regimen as appropriate

## 2023-08-27 ENCOUNTER — Inpatient Hospital Stay (HOSPITAL_COMMUNITY): Payer: No Typology Code available for payment source

## 2023-08-27 ENCOUNTER — Encounter (HOSPITAL_COMMUNITY)
Admission: EM | Disposition: A | Discharge status: Skilled Nursing Facility | Payer: Self-pay | Source: Home / Self Care | Attending: Family Medicine

## 2023-08-27 ENCOUNTER — Encounter (HOSPITAL_COMMUNITY): Payer: Self-pay

## 2023-08-27 ENCOUNTER — Other Ambulatory Visit: Payer: Self-pay

## 2023-08-27 DIAGNOSIS — E119 Type 2 diabetes mellitus without complications: Secondary | ICD-10-CM

## 2023-08-27 DIAGNOSIS — J449 Chronic obstructive pulmonary disease, unspecified: Secondary | ICD-10-CM

## 2023-08-27 DIAGNOSIS — Z794 Long term (current) use of insulin: Secondary | ICD-10-CM | POA: Diagnosis not present

## 2023-08-27 DIAGNOSIS — Z87891 Personal history of nicotine dependence: Secondary | ICD-10-CM

## 2023-08-27 DIAGNOSIS — I2510 Atherosclerotic heart disease of native coronary artery without angina pectoris: Secondary | ICD-10-CM

## 2023-08-27 DIAGNOSIS — W1830XA Fall on same level, unspecified, initial encounter: Secondary | ICD-10-CM | POA: Diagnosis not present

## 2023-08-27 DIAGNOSIS — T8781 Dehiscence of amputation stump: Secondary | ICD-10-CM | POA: Diagnosis not present

## 2023-08-27 HISTORY — PX: STUMP REVISION: SHX6102

## 2023-08-27 LAB — GLUCOSE, CAPILLARY
Glucose-Capillary: 215 mg/dL — ABNORMAL HIGH (ref 70–99)
Glucose-Capillary: 165 mg/dL — ABNORMAL HIGH (ref 70–99)
Glucose-Capillary: 114 mg/dL — ABNORMAL HIGH (ref 70–99)
Glucose-Capillary: 144 mg/dL — ABNORMAL HIGH (ref 70–99)
Glucose-Capillary: 402 mg/dL — ABNORMAL HIGH (ref 70–99)
Glucose-Capillary: 371 mg/dL — ABNORMAL HIGH (ref 70–99)

## 2023-08-27 LAB — CBC WITH DIFFERENTIAL/PLATELET
Abs Immature Granulocytes: 0.02 10*3/uL (ref 0.00–0.07)
Basophils Absolute: 0 10*3/uL (ref 0.0–0.1)
Basophils Relative: 1 %
Eosinophils Absolute: 0.1 10*3/uL (ref 0.0–0.5)
Eosinophils Relative: 2 %
HCT: 27.7 % — ABNORMAL LOW (ref 39.0–52.0)
Hemoglobin: 8.8 g/dL — ABNORMAL LOW (ref 13.0–17.0)
Immature Granulocytes: 0 %
Lymphocytes Relative: 11 %
Lymphs Abs: 0.6 10*3/uL — ABNORMAL LOW (ref 0.7–4.0)
MCH: 27.2 pg (ref 26.0–34.0)
MCHC: 31.8 g/dL (ref 30.0–36.0)
MCV: 85.8 fL (ref 80.0–100.0)
Monocytes Absolute: 0.1 10*3/uL (ref 0.1–1.0)
Monocytes Relative: 3 %
Neutro Abs: 4.7 10*3/uL (ref 1.7–7.7)
Neutrophils Relative %: 83 %
Platelets: 125 10*3/uL — ABNORMAL LOW (ref 150–400)
RBC: 3.23 MIL/uL — ABNORMAL LOW (ref 4.22–5.81)
RDW: 17.3 % — ABNORMAL HIGH (ref 11.5–15.5)
WBC: 5.6 10*3/uL (ref 4.0–10.5)
nRBC: 0 % (ref 0.0–0.2)

## 2023-08-27 LAB — BASIC METABOLIC PANEL
Anion gap: 3 — ABNORMAL LOW (ref 5–15)
BUN: 14 mg/dL (ref 8–23)
CO2: 26 mmol/L (ref 22–32)
Calcium: 8.3 mg/dL — ABNORMAL LOW (ref 8.9–10.3)
Chloride: 105 mmol/L (ref 98–111)
Creatinine, Ser: 0.87 mg/dL (ref 0.61–1.24)
GFR, Estimated: >60 mL/min (ref >60)
Glucose, Bld: 243 mg/dL — ABNORMAL HIGH (ref 70–99)
Potassium: 4 mmol/L (ref 3.5–5.1)
Sodium: 134 mmol/L — ABNORMAL LOW (ref 135–145)

## 2023-08-27 LAB — CBC
HCT: 29.3 % — ABNORMAL LOW (ref 39.0–52.0)
Hemoglobin: 9.4 g/dL — ABNORMAL LOW (ref 13.0–17.0)
MCH: 27.2 pg (ref 26.0–34.0)
MCHC: 32.1 g/dL (ref 30.0–36.0)
MCV: 84.7 fL (ref 80.0–100.0)
Platelets: 126 10*3/uL — ABNORMAL LOW (ref 150–400)
RBC: 3.46 MIL/uL — ABNORMAL LOW (ref 4.22–5.81)
RDW: 17.1 % — ABNORMAL HIGH (ref 11.5–15.5)
WBC: 3.7 10*3/uL — ABNORMAL LOW (ref 4.0–10.5)
nRBC: 0 % (ref 0.0–0.2)

## 2023-08-27 LAB — MAGNESIUM: Magnesium: 1.7 mg/dL (ref 1.7–2.4)

## 2023-08-27 LAB — MRSA NEXT GEN BY PCR, NASAL: MRSA by PCR Next Gen: NOT DETECTED

## 2023-08-27 SURGERY — REVISION, AMPUTATION SITE
Anesthesia: Regional | Site: Knee | Laterality: Left

## 2023-08-27 MED ORDER — MIDAZOLAM HCL 2 MG/2ML IJ SOLN
INTRAMUSCULAR | Status: AC
  Filled 2023-08-27: qty 2

## 2023-08-27 MED ORDER — ACETAMINOPHEN 10 MG/ML IV SOLN
INTRAVENOUS | Status: DC | PRN
  Administered 2023-08-27: 1000 mg via INTRAVENOUS

## 2023-08-27 MED ORDER — VANCOMYCIN HCL 1000 MG IV SOLR
INTRAVENOUS | Status: AC
  Filled 2023-08-27: qty 20

## 2023-08-27 MED ORDER — CHLORHEXIDINE GLUCONATE 0.12 % MT SOLN
OROMUCOSAL | Status: AC
  Administered 2023-08-27: 15 mL via OROMUCOSAL
  Filled 2023-08-27: qty 15

## 2023-08-27 MED ORDER — PROPOFOL 10 MG/ML IV BOLUS
INTRAVENOUS | Status: DC | PRN
  Administered 2023-08-27: 140 mg via INTRAVENOUS

## 2023-08-27 MED ORDER — PROPOFOL 10 MG/ML IV BOLUS
INTRAVENOUS | Status: AC
  Filled 2023-08-27: qty 20

## 2023-08-27 MED ORDER — DEXAMETHASONE SODIUM PHOSPHATE 10 MG/ML IJ SOLN
INTRAMUSCULAR | Status: DC | PRN
  Administered 2023-08-27: 4 mg via INTRAVENOUS

## 2023-08-27 MED ORDER — FENTANYL CITRATE (PF) 250 MCG/5ML IJ SOLN
INTRAMUSCULAR | Status: AC
  Filled 2023-08-27: qty 5

## 2023-08-27 MED ORDER — LIDOCAINE 2% (20 MG/ML) 5 ML SYRINGE
INTRAMUSCULAR | Status: DC | PRN
  Administered 2023-08-27: 40 mg via INTRAVENOUS

## 2023-08-27 MED ORDER — VANCOMYCIN HCL 1000 MG IV SOLR
INTRAVENOUS | Status: DC | PRN
  Administered 2023-08-27: 1000 mg

## 2023-08-27 MED ORDER — INSULIN GLARGINE 100 UNIT/ML ~~LOC~~ SOLN
15.0000 [IU] | Freq: Every day | SUBCUTANEOUS | Status: DC
  Filled 2023-08-27: qty 0.15

## 2023-08-27 MED ORDER — BUPIVACAINE HCL (PF) 0.25 % IJ SOLN
INTRAMUSCULAR | Status: DC | PRN
Start: 2023-08-27 — End: 2023-08-27
  Administered 2023-08-27: 25 mL via EPIDURAL
  Administered 2023-08-27: 20 mL via EPIDURAL

## 2023-08-27 MED ORDER — ACETAMINOPHEN 10 MG/ML IV SOLN
INTRAVENOUS | Status: AC
  Filled 2023-08-27: qty 100

## 2023-08-27 MED ORDER — ORAL CARE MOUTH RINSE: 15.0000 mL | Freq: Once | OROMUCOSAL | Status: AC

## 2023-08-27 MED ORDER — CHLORHEXIDINE GLUCONATE 0.12 % MT SOLN: 15.0000 mL | Freq: Once | OROMUCOSAL | Status: AC

## 2023-08-27 MED ORDER — PHENYLEPHRINE 80 MCG/ML (10ML) SYRINGE FOR IV PUSH (FOR BLOOD PRESSURE SUPPORT)
PREFILLED_SYRINGE | INTRAVENOUS | Status: DC | PRN
  Administered 2023-08-27 (×2): 160 ug via INTRAVENOUS

## 2023-08-27 MED ORDER — ONDANSETRON HCL 4 MG/2ML IJ SOLN
INTRAMUSCULAR | Status: DC | PRN
  Administered 2023-08-27: 4 mg via INTRAVENOUS

## 2023-08-27 MED ORDER — INSULIN GLARGINE 100 UNIT/ML ~~LOC~~ SOLN
15.0000 [IU] | Freq: Every day | SUBCUTANEOUS | Status: DC
  Administered 2023-08-28 (×2): 15 [IU] via SUBCUTANEOUS
  Filled 2023-08-27 (×3): qty 0.15

## 2023-08-27 MED ORDER — 0.9 % SODIUM CHLORIDE (POUR BTL) OPTIME
TOPICAL | Status: DC | PRN
Start: 2023-08-27 — End: 2023-08-27
  Administered 2023-08-27: 1000 mL

## 2023-08-27 MED ORDER — VASHE WOUND IRRIGATION OPTIME
TOPICAL | Status: DC | PRN
  Administered 2023-08-27: 34 [oz_av]

## 2023-08-27 MED ORDER — FENTANYL CITRATE (PF) 100 MCG/2ML IJ SOLN
INTRAMUSCULAR | Status: AC
  Administered 2023-08-27: 50 ug via INTRAVENOUS
  Filled 2023-08-27: qty 2

## 2023-08-27 MED ORDER — FENTANYL CITRATE (PF) 100 MCG/2ML IJ SOLN: 50.0000 ug | Freq: Once | INTRAMUSCULAR | Status: AC

## 2023-08-27 MED ORDER — LACTATED RINGERS IV SOLN: INTRAVENOUS | Status: AC

## 2023-08-27 SURGICAL SUPPLY — 30 items
BAG COUNTER SPONGE SURGICOUNT (BAG) ×1 IMPLANT
BLADE SAW RECIP 87.9 MT (BLADE) IMPLANT
BLADE SURG 21 STRL SS (BLADE) ×1 IMPLANT
CANISTER WOUND CARE 500ML ATS (WOUND CARE) ×1 IMPLANT
COVER SURGICAL LIGHT HANDLE (MISCELLANEOUS) ×1 IMPLANT
DRAPE DERMATAC (DRAPES) IMPLANT
DRAPE EXTREMITY T 121X128X90 (DISPOSABLE) ×1 IMPLANT
DRAPE HALF SHEET 40X57 (DRAPES) ×1 IMPLANT
DRAPE INCISE IOBAN 66X45 STRL (DRAPES) ×1 IMPLANT
DRAPE U-SHAPE 47X51 STRL (DRAPES) ×2 IMPLANT
DRESSING PREVENA PLUS CUSTOM (GAUZE/BANDAGES/DRESSINGS) ×1 IMPLANT
DRSG PREVENA PLUS CUSTOM (GAUZE/BANDAGES/DRESSINGS) ×1 IMPLANT
DURAPREP 26ML APPLICATOR (WOUND CARE) ×1 IMPLANT
ELECT REM PT RETURN 9FT ADLT (ELECTROSURGICAL) ×1 IMPLANT
ELECTRODE REM PT RTRN 9FT ADLT (ELECTROSURGICAL) ×1 IMPLANT
GLOVE BIOGEL PI IND STRL 9 (GLOVE) ×1 IMPLANT
GLOVE SURG ORTHO 9.0 STRL STRW (GLOVE) ×1 IMPLANT
GOWN STRL REUS W/ TWL XL LVL3 (GOWN DISPOSABLE) ×2 IMPLANT
GRAFT SKIN WND MICRO 38 (Tissue) IMPLANT
KIT BASIN OR (CUSTOM PROCEDURE TRAY) ×1 IMPLANT
KIT TURNOVER KIT B (KITS) ×1 IMPLANT
MANIFOLD NEPTUNE II (INSTRUMENTS) ×1 IMPLANT
NS IRRIG 1000ML POUR BTL (IV SOLUTION) ×1 IMPLANT
PACK GENERAL/GYN (CUSTOM PROCEDURE TRAY) ×1 IMPLANT
PAD ARMBOARD 7.5X6 YLW CONV (MISCELLANEOUS) ×1 IMPLANT
PREVENA RESTOR ARTHOFORM 46X30 (CANNISTER) ×1 IMPLANT
STAPLER VISISTAT 35W (STAPLE) IMPLANT
SUT ETHILON 2 0 PSLX (SUTURE) ×2 IMPLANT
SUT SILK 2-0 18XBRD TIE 12 (SUTURE) IMPLANT
TOWEL GREEN STERILE (TOWEL DISPOSABLE) ×1 IMPLANT

## 2023-08-27 NOTE — Assessment & Plan Note (Addendum)
 Secondary to fall from deconditioning and failure to properly transfer iso of recent surgery for L BKA. XR of the extremity without acute fracture. Picture in media of wound.  - Ortho following, appreciate recs.  - Flap revision in the OR this afternoon   - Defer antibiotics for now, CTM for infectious symptomatology - Continue home tramadol 50 mg every 6 hours for pain control - Continue Tylenol 1000 mg every 6 hours for pain control - Working with PT/OT - Trend CBCs, BMPs

## 2023-08-27 NOTE — Assessment & Plan Note (Addendum)
 A1c 10%. On Lantus 23u daily at home, as well as Metformin, Trulicity. Glucose remains in the low 200s since admission.  - Add back LAI tomorrow AM - Continue moderate SSI

## 2023-08-27 NOTE — Anesthesia Procedure Notes (Addendum)
 Anesthesia Regional Block: Popliteal block   Pre-Anesthetic Checklist: , timeout performed,  Correct Patient, Correct Site, Correct Laterality,  Correct Procedure, Correct Position, site marked,  Risks and benefits discussed,  Surgical consent,  Pre-op evaluation,  At surgeon's request and post-op pain management  Laterality: Left  Prep: chloraprep       Needles:  Injection technique: Single-shot  Needle Type: Echogenic Stimulator Needle     Needle Length: 9cm  Needle Gauge: 21     Additional Needles:   Procedures:,,,, ultrasound used (permanent image in chart),,    Narrative:  Start time: 08/27/2023 3:30 PM End time: 08/27/2023 3:40 PM Injection made incrementally with aspirations every 5 mL.  Performed by: Personally  Anesthesiologist: Crossville Nation, MD  Additional Notes: Discussed risks and benefits of the nerve block in detail, including but not limited vascular injury, permanent nerve damage and infection.   Patient tolerated the procedure well. Local anesthetic introduced in an incremental fashion under minimal resistance after negative aspirations. No paresthesias were elicited. After completion of the procedure, no acute issues were identified and patient continued to be monitored by RN.

## 2023-08-27 NOTE — Assessment & Plan Note (Addendum)
 Appears new from EKG 06/24. QRS duration prolonged, 130. No chest pain, SOB, dizziness to suggest ACS. QTc 513. - Will consider repeat troponins/EKG should he develop new symptomology - Avoid QTc prolonging medications where possible

## 2023-08-27 NOTE — Progress Notes (Addendum)
 Daily Progress Note Intern Pager: [phone]  Patient name: Adam Ray Medical record number: [id number] Date of birth: 01/21/1956 Age: 68 y.o. Gender: male  Primary Care Provider: Latrelle Dodrill, MD Consultants: Ortho  Code Status: Full   Pt Overview and Major Events to Date:  02/24: admitted, s/p 1uPRBC 02/25: s/p 1uPRBC  02/26: Underwent flap revision   Assessment and Plan:  Adam Ray is a 68 y.o. male who presented s/p fall at home with entrapment of recent left BKA that precipitated dehiscence and moderate blood loss. Pertinent PMH/PSH includes PAD, T2DM, prior osteomyelitis, COPD, HTN, HLD, CAD, BPH. 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  Assessment & Plan Dehiscence of amputation stump of left lower extremity (HCC) Secondary to fall from deconditioning and failure to properly transfer iso of recent surgery for L BKA. XR of the extremity without acute fracture. Picture in media of wound.  - Ortho following, appreciate recs.  - Flap revision in the OR this afternoon   - Defer antibiotics for now, CTM for infectious symptomatology - Continue home tramadol 50 mg every 6 hours for pain control - Continue Tylenol 1000 mg every 6 hours for pain control - Working with PT/OT - Trend CBCs, BMPs Acute blood loss anemia S/p 2uPRBCs. No longer bleeding. Hgb stable on follow up monitoring.  - Monitor AM CBCs - Transfusion threshold <8.0 hgb in setting of CAD Type 2 diabetes mellitus (HCC) A1c 10%. On Lantus 23u daily at home, as well as Metformin, Trulicity. Glucose remains in the low 200s since admission.  - Add back LAI tomorrow AM - Continue moderate SSI  Left bundle branch block (LBBB) Appears new from EKG 06/24. QRS duration prolonged, 130.  No chest pain, SOB, dizziness to suggest ACS. QTc 513. - Will consider repeat troponins/EKG should he develop new symptomology - Avoid QTc prolonging medications where possible  Chronic and Stable  Issues: COPD: Clinically silent. No SOB. HTN: BPs WNL. CTM. PAD: Continue home aspirin, Crestor 40 mg. Hx of osteomyelitis: X-ray negative for obvious infectious process. Could do further imaging/biopsy if indicated. BPH: Continue home finasteride, flomax.  FEN/GI: NPO for procedure, will add back following flap revision  PPx: home ASA for now; had residual bleeding from limb at acute rehab prior to this admission. Will consider adding this back following surgery.  Dispo: Likely SNF pending clinical improvement . Barriers include need for flap revision this afternoon.   Subjective:  NAEON. No acute complaints this morning. Continues to have 6/10 pain in the left extremity at site of BKA, but he states it has not worsened and is controlled overall.   Objective: Temp:  [98 F (36.7 C)-98.4 F (36.9 C)] 98 F (36.7 C) (02/25 2007) Pulse Rate:  [70-85] 70 (02/26 0529) Resp:  [17-18] 18 (02/26 0529) BP: (99-122)/(60-63) 122/62 (02/26 0529) SpO2:  [97 %-100 %] 98 % (02/26 0529)  Physical Exam: General: Resting comfortably in bed. NAD.  Cardiovascular: RRR. Systolic ejection murmur. No rubs, gallops.  Respiratory: Breathing comfortably on RA. Good air movement bilaterally. No wheezes, rales, rhonchi.  Abdomen: Soft, NTND.  Extremities: s/p bilateral BKAs. Compression bandage in place over site of left BKA. No surrounding swelling or erythema.   Laboratory: Most recent CBC Lab Results  Component Value Date   WBC 3.7 (L) 08/27/2023   HGB 9.4 (L) 08/27/2023   HCT 29.3 (L) 08/27/2023   MCV 84.7 08/27/2023   PLT 126 (L) 08/27/2023   Most recent BMP  Latest Ref Rng & Units 08/27/2023    3:44 AM  BMP  Glucose 70 - 99 mg/dL 295   BUN 8 - 23 mg/dL 14   Creatinine 6.21 - 1.24 mg/dL 3.08   Sodium 657 - 846 mmol/L 134   Potassium 3.5 - 5.1 mmol/L 4.0   Chloride 98 - 111 mmol/L 105   CO2 22 - 32 mmol/L 26   Calcium 8.9 - 10.3 mg/dL 8.3     Other pertinent labs: Mg wnl     Imaging/Diagnostic Tests: No new imaging in the last 24hr.   Lucia Estelle, Medical Student 08/27/2023, 7:21 AM [facility] FPTS Intern pager: [phone], text pages welcome Secure chat group [facility] Teaching Service    FPTS Upper-Level Resident Addendum   I have independently interviewed and examined the patient. I have discussed the above with [facility] and agree with the documented plan. My edits for correction/addition/clarification are included above. Please see any attending notes.   Vonna Drafts, MD PGY-2, [facility] 08/27/2023 11:12 AM  FPTS Service pager: [phone] (text pages welcome through [facility])

## 2023-08-27 NOTE — Transfer of Care (Signed)
 Immediate Anesthesia Transfer of Care Note  Patient: Adam Ray  Procedure(s) Performed: REVISION LEFT BELOW KNEE AMPUTATION (Left: Knee)  Patient Location: PACU  Anesthesia Type:General  Level of Consciousness: awake and drowsy  Airway & Oxygen Therapy: Patient Spontanous Breathing  Post-op Assessment: Report given to RN, Post -op Vital signs reviewed and stable, and Patient moving all extremities  Post vital signs: Reviewed and stable  Last Vitals:  Vitals Value Taken Time  BP    Temp    Pulse    Resp    SpO2      Last Pain:  Vitals:   08/27/23 1530  TempSrc:   PainSc: 0-No pain         Complications: No notable events documented.

## 2023-08-27 NOTE — Anesthesia Procedure Notes (Signed)
 Anesthesia Regional Block: Adductor canal block   Pre-Anesthetic Checklist: , timeout performed,  Correct Patient, Correct Site, Correct Laterality,  Correct Procedure, Correct Position, site marked,  Risks and benefits discussed,  Surgical consent,  Pre-op evaluation,  At surgeon's request and post-op pain management  Laterality: Left  Prep: chloraprep       Needles:  Injection technique: Single-shot  Needle Type: Echogenic Stimulator Needle     Needle Length: 9cm  Needle Gauge: 21     Additional Needles:   Procedures:,,,, ultrasound used (permanent image in chart),,    Narrative:  Start time: 08/27/2023 3:35 PM End time: 08/27/2023 3:40 PM Injection made incrementally with aspirations every 5 mL.  Performed by: Personally  Anesthesiologist: Taos Nation, MD  Additional Notes: Discussed risks and benefits of the nerve block in detail, including but not limited vascular injury, permanent nerve damage and infection.   Patient tolerated the procedure well. Local anesthetic introduced in an incremental fashion under minimal resistance after negative aspirations. No paresthesias were elicited. After completion of the procedure, no acute issues were identified and patient continued to be monitored by RN.

## 2023-08-27 NOTE — Plan of Care (Signed)
 FMTS Interim Progress Note  S: Patient is awake sitting up in bed without complaint on exam this p.m.  He denies any chest pain, shortness of breath, stomach pain nausea or vomiting.  O: BP 95/65   Pulse 73   Temp 97.8 F (36.6 C)   Resp 18   Ht 5\' 10"  (1.778 m)   Wt 77.1 kg   SpO2 99%   BMI 24.39 kg/m   General: Elderly-appearing gentleman, no distress Cardiac: RRR, no M/R/G Respiratory: CTAB, no increased work of breathing Abdomen: Flat, soft, nontender Extremities: Left stump clean and without any appearance of injury, right stump bandaged, without any appearance of swelling or redness.  A/P: Patient appears to have tolerated his procedure well and was without complaint on exam.  Will reinstate heart healthy diet at this time and continue to monitor for pain control overnight tonight.  Gerrit Heck, DO 08/27/2023, 6:22 PM PGY-1, [facility] Family Medicine Service pager [phone]

## 2023-08-27 NOTE — Op Note (Signed)
 08/27/2023  4:42 PM  PATIENT:  Adam Ray    PRE-OPERATIVE DIAGNOSIS:  Dehiscence Left Below Knee Amputation  POST-OPERATIVE DIAGNOSIS:  Same  PROCEDURE:  REVISION LEFT BELOW KNEE AMPUTATION Application Kerecis micro graft 38 cm and vancomycin powder 1 g. Application of Prevena customizable wound VAC. Hematoma tissue sent for cultures.   SURGEON:  [name], MD  PHYSICIAN ASSISTANT:None ANESTHESIA:   General  PREOPERATIVE INDICATIONS:  [name] is a  68 y.o. male with a diagnosis of Dehiscence Left Below Knee Amputation who failed conservative measures and elected for surgical management.    The risks benefits and alternatives were discussed with the patient preoperatively including but not limited to the risks of infection, bleeding, nerve injury, cardiopulmonary complications, the need for revision surgery, among others, and the patient was willing to proceed.  OPERATIVE IMPLANTS:   Implant Name Type Inv. Item Serial No. Manufacturer Lot No. LRB No. Used Action  GRAFT SKIN WND MICRO 38 - [id number] Tissue GRAFT SKIN WND MICRO 38  [employer] [id number] Left 1 Implanted    @ENCIMAGES @  OPERATIVE FINDINGS: Patient had a large hematoma that appeared infected.  This was sent for cultures.  OPERATIVE PROCEDURE: Patient was brought the operating room underwent a general anesthetic.  After adequate levels anesthesia were obtained patient's left lower extremity was prepped using DuraPrep draped into a sterile field a timeout was called.  A fishmouth incision was made around the left transtibial amputation proximal to the area of ischemic tissue.  This was carried sharply down to bone.  A reciprocating saw was used to resect the distal 2 cm of the tibia and fibula.  The bone was beveled anteriorly.  The 21 blade knife was used to resect nonviable tissue back to healthy viable bleeding tissue.  This was cauterized with electrocautery.  The vessels were suture-ligated with 2-0  silk.  Hemostasis was obtained.  The wound was irrigated with Vashe.  The wound bed was filled with 38 cm of Kerecis micro graft and 1 g vancomycin powder.  The incision was closed using 2-0 nylon.  A Prevena customizable was applied this had a good suction fit this was overwrapped from shrinker.  Patient was extubated taken the PACU in stable condition.   DISCHARGE PLANNING:  Antibiotic duration: Continue antibiotics for 24 hours.  Weightbearing: Weightbearing as needed for transfers  Pain medication: Prescription for Dilaudid  Dressing care/ Wound VAC: Continue wound VAC for 1 week  Ambulatory devices: Anticipate transfer training only  Discharge to: Plan for discharge to skilled nursing.  Follow-up: In the office 1 week post operative.

## 2023-08-27 NOTE — Interval H&P Note (Signed)
 History and Physical Interval Note:  08/27/2023 6:49 AM  Adam Ray  has presented today for surgery, with the diagnosis of Dehiscence Left Below Knee Amputation.  The various methods of treatment have been discussed with the patient and family. After consideration of risks, benefits and other options for treatment, the patient has consented to  Procedure(s): REVISION LEFT BELOW KNEE AMPUTATION (Left) as a surgical intervention.  The patient's history has been reviewed, patient examined, no change in status, stable for surgery.  I have reviewed the patient's chart and labs.  Questions were answered to the patient's satisfaction.     [name]

## 2023-08-27 NOTE — Consult Note (Signed)
 [facility] Liaison Consult Note    08/27/2023  JASPREET HOLLINGS [birth year] [id number]  Insurance: [place] [employer] Dual Complete   Primary Care Provider: Latrelle Dodrill, MD  this provider is listed for the transition of care follow up appointments  and [facility] calls   [facility] Liaison patient rounds family entering and patient being prepared for OR.    The patient was screened for less than 30 day readmission hospitalization with noted medium risk score for unplanned readmission risk 3 hospital admissions in 6 months.  The patient was assessed for potential Community Care Coordination service needs for post hospital transition for care coordination. Review of patient's electronic medical record reveals patient is for surgical intervention..   Plan: [facility] Liaison will continue to follow progress and disposition to asess for post hospital community care coordination/management needs.  Referral request for community care coordination: Pending post op needs. Following   [facility] Community Care, Population Health does not replace or interfere with any arrangements made by the Inpatient Transition of Care team.   For questions contact:   Charlesetta Shanks, RN, BSN, CCM [place]  [facility], [facility] Liaison Direct Dial: [phone] or secure chat Email: [email or web address]

## 2023-08-27 NOTE — Anesthesia Preprocedure Evaluation (Signed)
 Anesthesia Evaluation  Patient identified by MRN, date of birth, ID band Patient awake    Reviewed: Allergy & Precautions, H&P , NPO status , Patient's Chart, lab work & pertinent test results, Unable to perform ROS - Chart review only  Airway Mallampati: II  TM Distance: >3 FB Neck ROM: Full    Dental no notable dental hx. (+) Teeth Intact, Dental Advisory Given   Pulmonary sleep apnea , pneumonia, COPD, former smoker   Pulmonary exam normal breath sounds clear to auscultation       Cardiovascular Exercise Tolerance: Good hypertension, + CAD, + Past MI, + Peripheral Vascular Disease and +CHF  Normal cardiovascular exam+ Valvular Problems/Murmurs AS  Rhythm:Regular Rate:Normal  TEE 24    1. Left ventricular ejection fraction, by estimation, is 70 to 75%. The left ventricle has hyperdynamic function. The left ventricle has no regional wall motion abnormalities. There is mild left ventricular hypertrophy.  2. Right ventricular systolic function is normal. The right ventricular size is normal. There is normal pulmonary artery systolic pressure.  3. Mild mitral valve regurgitation. Moderate mitral annular calcification.  4. AV is thickened, calcified with restricted motion. Peak and mean gradients through the valve are 32 and 20 mm HG respectively. AVA (VTI) is 1.52 cm2. Dimensionless index is 0.34. Overall consistent with moderate AS. This is new wen compared to report  from 2020. The aortic valve is tricuspid. Aortic valve regurgitation is not visualized.  5. The inferior vena cava is normal in size with greater than 50% respiratory variability, suggesting right atrial pressure of 3 mmHg.      Neuro/Psych  PSYCHIATRIC DISORDERS Anxiety Depression     Neuromuscular disease    GI/Hepatic negative GI ROS, Neg liver ROS,,,  Endo/Other  diabetes, Type 2    Renal/GU Renal disease  negative genitourinary   Musculoskeletal  (+)  Arthritis ,    Abdominal   Peds  Hematology  (+) Blood dyscrasia, anemia   Anesthesia Other Findings All: codeine  Reproductive/Obstetrics negative OB ROS                              Anesthesia Physical Anesthesia Plan  ASA: 4  Anesthesia Plan: General and Regional   Post-op Pain Management: Regional block*   Induction: Intravenous  PONV Risk Score and Plan: 3 and Treatment may vary due to age or medical condition, Ondansetron and Dexamethasone  Airway Management Planned: LMA  Additional Equipment: None  Intra-op Plan:   Post-operative Plan: Extubation in OR  Informed Consent: I have reviewed the patients History and Physical, chart, labs and discussed the procedure including the risks, benefits and alternatives for the proposed anesthesia with the patient or authorized representative who has indicated his/her understanding and acceptance.     Dental advisory given  Plan Discussed with:   Anesthesia Plan Comments: (PAT note by Antionette Poles, PA-C:  68 year old male follows with cardiology for history of PAD, HTN, moderate aortic stenosis, CAD s/p inferior STEMI treated with DES to mid left circumflex 07/2018.  Echo 05/2023 showed EF 70 to 75% with hyperdynamic function, mild left ventricular hypertrophy, mild mitral valve regurgitation, moderate aortic stenosis with mean gradient 20 mmHg and AVA 1.52 cm2.  Last seen in follow-up by Joni Reining, NP on 05/09/2023.  Per note, asymptomatic for chest pain or shortness of breath, however he is noted to be very sedentary at baseline.  Recommended repeat echo in 6 months.  Follows with orthopedics  for history of left TMA, now with dehiscence of amputation stump.  Last seen on 07/30/2023 and left BKA was recommended due to worsening ulcer over TMA amputation.  Follows with hematology for history of pancytopenia.  He is currently receiving B12 injections.  Former smoker (16.8 pack years, quit 2020)  with associated COPD, not on any daily inhaled medications.  Poorly controlled IDDM 2, last A1c 10.0 07/10/2022.  Patient will need day of surgery labs and evaluation.  EKG 12/27/2022: Sinus rhythm.  Rate 75.  Short PR interval.  Anterior infarct, old.  TTE 05/05/2023: 1. Left ventricular ejection fraction, by estimation, is 70 to 75%. The  left ventricle has hyperdynamic function. The left ventricle has no  regional wall motion abnormalities. There is mild left ventricular  hypertrophy.   2. Right ventricular systolic function is normal. The right ventricular  size is normal. There is normal pulmonary artery systolic pressure.   3. Mild mitral valve regurgitation. Moderate mitral annular  calcification.   4. AV is thickened, calcified with restricted motion. Peak and mean  gradients through the valve are 32 and 20 mm HG respectively. AVA (VTI) is  1.52 cm2. Dimensionless index is 0.34. Overall consistent with moderate  AS. This is new wen compared to report   from 2020. The aortic valve is tricuspid. Aortic valve regurgitation is  not visualized.   5. The inferior vena cava is normal in size with greater than 50%  respiratory variability, suggesting right atrial pressure of 3 mmHg.   Cath/PCI 07/12/2018:  Prox Cx to Mid Cx lesion is 95% stenosed.  Post intervention, there is a 0% residual stenosis.  A stent was successfully placed.   Acute inferior ST segment elevation myocardial infarction secondary to subtotal occlusion of a codominant mid left circumflex coronary artery.   Normal LAD and RCA.   LVEDP 20 mmHg.   Successful PCI to the mid left circumflex coronary artery with ultimate insertion of a 2.25 x 24 mm Synergy DES stent postdilated to 2.4 mm with the stenosis being reduced to 0%.     )        Anesthesia Quick Evaluation

## 2023-08-27 NOTE — Inpatient Diabetes Management (Addendum)
 Inpatient Diabetes Program Recommendations  AACE/ADA: New Consensus Statement on Inpatient Glycemic Control (2015)  Target Ranges:  Prepandial:   less than 140 mg/dL      Peak postprandial:   less than 180 mg/dL (1-2 hours)      Critically ill patients:  140 - 180 mg/dL   Lab Results  Component Value Date   GLUCAP 215 (H) 08/27/2023   HGBA1C 10.0 (H) 08/01/2023    Review of Glycemic Control  Latest Reference Range & Units 08/25/23 23:38 08/26/23 07:32 08/26/23 17:25 08/26/23 20:03 08/27/23 08:03  Glucose-Capillary 70 - 99 mg/dL 161 (H) 096 (H) 045 (H) 201 (H) 215 (H)   Diabetes history: DM 2 Outpatient Diabetes medications: Trulicity 0.75 mg Weekly, Lantus 23 units, Metformin 850 mg bid Current orders for Inpatient glycemic control:  Novolog 0-15 units tid  A1c  10% on 1/31  Inpatient Diabetes Program Recommendations:    -   Start Lantus 8 units -   Reduce Novolog Correction to 0-6 units tid + hs  Pt in OR when I rounded. Will visit another time prior to discharge regarding A1c of 10% on 1/31.  Thanks,  Christena Deem RN, MSN, BC-ADM Inpatient Diabetes Coordinator Team Pager [phone] (8a-5p)

## 2023-08-27 NOTE — Assessment & Plan Note (Addendum)
 S/p 2uPRBCs. No longer bleeding. Hgb stable on follow up monitoring.  - Monitor AM CBCs - Transfusion threshold <8.0 hgb in setting of CAD

## 2023-08-28 ENCOUNTER — Encounter (HOSPITAL_COMMUNITY): Payer: Self-pay | Admitting: Orthopedic Surgery

## 2023-08-28 DIAGNOSIS — T8781 Dehiscence of amputation stump: Secondary | ICD-10-CM | POA: Diagnosis not present

## 2023-08-28 DIAGNOSIS — D62 Acute posthemorrhagic anemia: Secondary | ICD-10-CM | POA: Diagnosis not present

## 2023-08-28 LAB — CBC
HCT: 23.5 % — ABNORMAL LOW (ref 39.0–52.0)
Hemoglobin: 7.6 g/dL — ABNORMAL LOW (ref 13.0–17.0)
MCH: 27.5 pg (ref 26.0–34.0)
MCHC: 32.3 g/dL (ref 30.0–36.0)
MCV: 85.1 fL (ref 80.0–100.0)
Platelets: 127 10*3/uL — ABNORMAL LOW (ref 150–400)
RBC: 2.76 MIL/uL — ABNORMAL LOW (ref 4.22–5.81)
RDW: 16.9 % — ABNORMAL HIGH (ref 11.5–15.5)
WBC: 4.8 10*3/uL (ref 4.0–10.5)
nRBC: 0 % (ref 0.0–0.2)

## 2023-08-28 LAB — HEMOGLOBIN AND HEMATOCRIT, BLOOD
HCT: 25.8 % — ABNORMAL LOW (ref 39.0–52.0)
Hemoglobin: 8.7 g/dL — ABNORMAL LOW (ref 13.0–17.0)

## 2023-08-28 LAB — BASIC METABOLIC PANEL
Anion gap: 15 (ref 5–15)
BUN: 16 mg/dL (ref 8–23)
CO2: 24 mmol/L (ref 22–32)
Calcium: 9 mg/dL (ref 8.9–10.3)
Chloride: 96 mmol/L — ABNORMAL LOW (ref 98–111)
Creatinine, Ser: 1.03 mg/dL (ref 0.61–1.24)
GFR, Estimated: >60 mL/min (ref >60)
Glucose, Bld: 371 mg/dL — ABNORMAL HIGH (ref 70–99)
Potassium: 4.5 mmol/L (ref 3.5–5.1)
Sodium: 135 mmol/L (ref 135–145)

## 2023-08-28 LAB — GLUCOSE, CAPILLARY
Glucose-Capillary: 115 mg/dL — ABNORMAL HIGH (ref 70–99)
Glucose-Capillary: 373 mg/dL — ABNORMAL HIGH (ref 70–99)
Glucose-Capillary: 228 mg/dL — ABNORMAL HIGH (ref 70–99)
Glucose-Capillary: 264 mg/dL — ABNORMAL HIGH (ref 70–99)
Glucose-Capillary: 292 mg/dL — ABNORMAL HIGH (ref 70–99)
Glucose-Capillary: 239 mg/dL — ABNORMAL HIGH (ref 70–99)

## 2023-08-28 LAB — PREPARE RBC (CROSSMATCH)

## 2023-08-28 LAB — MAGNESIUM: Magnesium: 1.7 mg/dL (ref 1.7–2.4)

## 2023-08-28 MED ORDER — CEFAZOLIN SODIUM-DEXTROSE 2-4 GM/100ML-% IV SOLN
2.0000 g | Freq: Three times a day (TID) | INTRAVENOUS | Status: AC
  Administered 2023-08-28 (×2): 2 g via INTRAVENOUS
  Filled 2023-08-28 (×2): qty 100

## 2023-08-28 MED ORDER — POTASSIUM CHLORIDE CRYS ER 20 MEQ PO TBCR: 20.0000 meq | EXTENDED_RELEASE_TABLET | Freq: Every day | ORAL | Status: DC | PRN

## 2023-08-28 MED ORDER — JUVEN PO PACK
1.0000 | PACK | Freq: Two times a day (BID) | ORAL | Status: DC
  Administered 2023-08-28 – 2023-09-02 (×12): 1 via ORAL
  Filled 2023-08-28 (×11): qty 1

## 2023-08-28 MED ORDER — MAGNESIUM SULFATE 2 GM/50ML IV SOLN: 2.0000 g | Freq: Every day | INTRAVENOUS | Status: DC | PRN

## 2023-08-28 MED ORDER — INSULIN ASPART 100 UNIT/ML IJ SOLN
15.0000 [IU] | Freq: Once | INTRAMUSCULAR | Status: AC
  Administered 2023-08-28: 15 [IU] via SUBCUTANEOUS

## 2023-08-28 MED ORDER — SODIUM CHLORIDE 0.9 % IV SOLN: INTRAVENOUS | Status: DC

## 2023-08-28 MED ORDER — HYDROMORPHONE HCL 1 MG/ML IJ SOLN
0.5000 mg | INTRAMUSCULAR | Status: DC | PRN
  Administered 2023-08-28: 1 mg via INTRAVENOUS
  Filled 2023-08-28 (×3): qty 1

## 2023-08-28 MED ORDER — ZINC SULFATE 220 (50 ZN) MG PO CAPS
220.0000 mg | ORAL_CAPSULE | Freq: Every day | ORAL | Status: DC
  Administered 2023-08-28 – 2023-09-02 (×6): 220 mg via ORAL
  Filled 2023-08-28 (×6): qty 1

## 2023-08-28 MED ORDER — ACETAMINOPHEN 325 MG PO TABS
325.0000 mg | ORAL_TABLET | Freq: Four times a day (QID) | ORAL | Status: DC | PRN
  Administered 2023-08-29: 650 mg via ORAL
  Filled 2023-08-28: qty 2

## 2023-08-28 MED ORDER — SODIUM CHLORIDE 0.9% IV SOLUTION: Freq: Once | INTRAVENOUS | Status: AC

## 2023-08-28 MED ORDER — VITAMIN C 500 MG PO TABS
1000.0000 mg | ORAL_TABLET | Freq: Every day | ORAL | Status: DC
  Administered 2023-08-28 – 2023-09-02 (×6): 1000 mg via ORAL
  Filled 2023-08-28 (×6): qty 2

## 2023-08-28 NOTE — Assessment & Plan Note (Addendum)
 Secondary to fall from deconditioning and failure to properly transfer iso of recent surgery for L BKA. XR of the extremity without acute fracture. Picture in media of wound. Underwent flap revision in the OR on 02/26. - Ortho following, appreciate recs.  - POD#1, doing well      - Continue Tylenol PRN, Tramadol PRN for pain control - Ortho added Diluadid PRN q4h for further post-op pain control  - Working with PT/OT - Trend CBCs, BMPs - Continue Ancef today per ortho  - Follow wound cultures sent in OR

## 2023-08-28 NOTE — NC FL2 (Signed)
 [place] MEDICAID [id number] LEVEL OF CARE FORM     IDENTIFICATION  Patient Name: Adam Ray Birthdate: 1955-10-13 Sex: male Admission Date (Current Location): 08/25/2023  [facility] and [place] Number:  Producer, television/film/video and Address:  [facility], [address] [place], [place] [address]      Provider Number: [id number]  Attending Physician Name and Address:  Doreene Eland, MD  Relative Name and Phone Number:       Current Level of Care: Hospital Recommended Level of Care: Skilled Nursing Facility Prior Approval Number:    Date Approved/Denied: 08/28/23 PASRR Number: [id number]  Discharge Plan: SNF    Current Diagnoses: Patient Active Problem List   Diagnosis Date Noted   Amputation of left lower extremity below knee (HCC) 08/26/2023   Type 2 diabetes mellitus treated with insulin (HCC) 08/26/2023   Left bundle branch block (LBBB) 08/26/2023   Acute blood loss anemia 08/26/2023   Dehiscence of amputation stump of left lower extremity (HCC) 08/25/2023   S/P BKA (below knee amputation) (HCC) 08/05/2023   Ganglion of foot, left 08/01/2023   S/P BKA (below knee amputation) unilateral, left (HCC) 08/01/2023   Routine adult health maintenance 04/30/2023   Heart murmur, systolic 03/13/2023   Pancytopenia (HCC) 02/18/2023   Bone lesion 02/18/2023   B12 deficiency anemia 02/18/2023   Wound dehiscence, surgical, initial encounter 12/25/2022   Maggot infestation 12/25/2022   Abnormal bone radiograph 12/18/2022   Osteomyelitis of second toe of left foot (HCC) 10/11/2022   Osteomyelitis of great toe of left foot (HCC) 10/11/2022   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   Atrophy of tongue papillae 10/02/2022   Atherosclerosis of coronary artery without angina pectoris 10/02/2022   Age-related nuclear cataract of left eye 10/02/2022   Brown hairy tongue 10/02/2022   Proliferative diabetic retinopathy of  right eye associated with type 2 diabetes mellitus (HCC) 10/02/2022   Tongue discoloration 02/14/2022   Hematuria 08/16/2021   Cervical spondylosis without myelopathy 12/14/2020   Pain in joint of left shoulder 12/14/2020   Urinary incontinence 08/10/2020   Increased urinary frequency 05/09/2020   Nocturnal leg cramps 10/28/2019   Hyperlipidemia associated with type 2 diabetes mellitus (HCC) 05/26/2019   Limited mobility 05/12/2018   Anxiety state 06/02/2017   Leg swelling 01/29/2017   Idiopathic chronic venous hypertension of both lower extremities with inflammation 09/24/2016   Diabetic polyneuropathy associated with type 2 diabetes mellitus (HCC)    Type 2 diabetes mellitus with neurologic complication, with long-term current use of insulin (HCC) 12/08/2015   Left shoulder pain 08/10/2015   Pulmonary nodule 02/01/2015   Depression 12/09/2013   Warts, genital 08/20/2013   Diabetic retinopathy (HCC) 01/28/2013   Sleep apnea 09/06/2010   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   Hypertension associated with diabetes (HCC) 11/09/2008   COPD, mild (HCC) 10/06/2006   SICKLE-CELL TRAIT 04/26/2006   ERECTILE DYSFUNCTION 04/26/2006   Tobacco use 04/26/2006   Peripheral arterial disease (HCC) 04/26/2006   Allergic rhinitis 04/26/2006    Orientation RESPIRATION BLADDER Height & Weight     Self, Time, Situation, Place  Normal Continent Weight: 77.1 kg Height:  5\' 10"  (177.8 cm)  BEHAVIORAL SYMPTOMS/MOOD NEUROLOGICAL BOWEL NUTRITION STATUS      Continent Diet (see discharge summary)  AMBULATORY STATUS COMMUNICATION OF NEEDS Skin   Limited Assist Verbally Wound Vac (Prevena - Left leg)  Personal Care Assistance Level of Assistance  Bathing, Dressing Bathing Assistance: Limited assistance   Dressing Assistance: Limited assistance     Functional Limitations Info             SPECIAL CARE FACTORS FREQUENCY  PT (By licensed PT), OT (By licensed OT)     PT  Frequency: 5X weekly OT Frequency: 5X weekly            Contractures Contractures Info: Not present    Additional Factors Info  Code Status, Allergies, Insulin Sliding Scale Code Status Info: Full Allergies Info: Codeine   Hydrocodone    Oxycodone     Morphine  Propofol   Insulin Sliding Scale Info: insulin glargine (LANTUS SOLOSTAR) 100 UNIT/ML Solostar Pen Inject 23 Units       Current Medications (08/28/2023):  This is the current hospital active medication list Current Facility-Administered Medications  Medication Dose Route Frequency Provider Last Rate Last Admin   0.9 %  sodium chloride infusion (Manually program via Guardrails IV Fluids)   Intravenous Once [name], MD   Held at 08/25/23 1936   0.9 %  sodium chloride infusion   Intravenous Continuous [name], MD 10 mL/hr at 08/28/23 0106 New Bag at 08/28/23 0106   acetaminophen (TYLENOL) tablet 325-650 mg  325-650 mg Oral Q6H PRN [name], MD       ascorbic acid (VITAMIN C) tablet 1,000 mg  1,000 mg Oral Daily [name], MD   1,000 mg at 08/28/23 0932   aspirin chewable tablet 81 mg  81 mg Oral Daily [name], MD   81 mg at 08/28/23 0932   cholecalciferol (VITAMIN D3) 25 MCG (1000 UNIT) tablet 1,000 Units  1,000 Units Oral Daily [name], MD   1,000 Units at 08/28/23 0932   cyclobenzaprine (FLEXERIL) tablet 5 mg  5 mg Oral TID PRN [name], MD   5 mg at 08/26/23 1631   famotidine (PEPCID) tablet 20 mg  20 mg Oral Daily [name], MD   20 mg at 08/28/23 0932   fesoterodine (TOVIAZ) tablet 4 mg  4 mg Oral Daily [name], MD   4 mg at 08/28/23 0932   finasteride (PROSCAR) tablet 5 mg  5 mg Oral Daily [name], MD   5 mg at 08/28/23 0932   HYDROmorphone (DILAUDID) injection 0.5-1 mg  0.5-1 mg Intravenous Q4H PRN [name], MD       insulin aspart (novoLOG) injection 0-15 Units  0-15 Units Subcutaneous TID WC [name], MD   8 Units at 08/28/23 1241   insulin  glargine (LANTUS) injection 15 Units  15 Units Subcutaneous Daily Evette Georges, MD   15 Units at 08/28/23 0040   magnesium sulfate IVPB 2 g 50 mL  2 g Intravenous Daily PRN [name], MD       mirabegron ER [facility]) tablet 25 mg  25 mg Oral Daily [name], MD   25 mg at 08/28/23 1054   multivitamin with minerals tablet 1 tablet  1 tablet Oral Daily [name], MD   1 tablet at 08/28/23 0932   nutrition supplement (JUVEN) (JUVEN) powder packet 1 packet  1 packet Oral BID BM [name], MD   1 packet at 08/28/23 1427   potassium chloride SA (KLOR-CON M) CR tablet 20-40 mEq  20-40 mEq Oral Daily PRN [name], MD  rosuvastatin (CRESTOR) tablet 20 mg  20 mg Oral QHS [name], MD   20 mg at 08/27/23 2313   tamsulosin (FLOMAX) capsule 0.4 mg  0.4 mg Oral Daily [name], MD   0.4 mg at 08/28/23 0932   traMADol (ULTRAM) tablet 50 mg  50 mg Oral Q6H PRN [name], MD   50 mg at 08/28/23 1528   zinc sulfate (50mg  elemental zinc) capsule 220 mg  220 mg Oral Daily [name], MD   220 mg at 08/28/23 0932     Discharge Medications: Please see discharge summary for a list of discharge medications.  Relevant Imaging Results:  Relevant Lab Results:   Additional Information SS# [id number]   Prevena wound Vac  Franchesca Veneziano, Smith Robert, RN

## 2023-08-28 NOTE — Assessment & Plan Note (Addendum)
 S/p 2uPRBCs. Hgb has fallen below threshold again this AM following his flap revision.  - Monitor AM CBCs - Transfusion threshold <8.0 hgb in setting of CAD - Repeat transfusion this AM, will recheck H/H post-transfusion

## 2023-08-28 NOTE — Progress Notes (Addendum)
 Daily Progress Note Intern Pager: [phone]  Patient name: Adam Ray Medical record number: [id number] Date of birth: 04/24/1956 Age: 68 y.o. Gender: male  Primary Care Provider: Latrelle Dodrill, MD Consultants: ortho  Code Status: Full   Pt Overview and Major Events to Date:  02/24: admitted, s/p 1uPRBC 02/25: s/p 1uPRBC  02/26: Underwent flap revision  02/27: s/p 1uPRBC  Assessment and Plan:  Adam Ray is a 68 y.o. male who presented s/p fall at home with entrapment of recent left BKA that precipitated dehiscence and moderate blood loss and now s/p flap revision. Pertinent PMH/PSH includes PAD, T2DM, chronic pancytopenia, prior osteomyelitis, COPD, HTN, HLD, CAD, BPH. 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  Assessment & Plan Dehiscence of amputation stump of left lower extremity (HCC) Secondary to fall from deconditioning and failure to properly transfer iso of recent surgery for L BKA. XR of the extremity without acute fracture. Picture in media of wound. Underwent flap revision in the OR on 02/26. - Ortho following, appreciate recs.  - POD#1, doing well      - Continue Tylenol PRN, Tramadol PRN for pain control - Ortho added Diluadid PRN q4h for further post-op pain control  - Working with PT/OT - Trend CBCs, BMPs - Continue Ancef today per ortho  - Follow wound cultures sent in OR   Acute blood loss anemia S/p 2uPRBCs. Hgb has fallen below threshold again this AM following his flap revision.  - Monitor AM CBCs - Transfusion threshold <8.0 hgb in setting of CAD - Repeat transfusion this AM, will recheck H/H post-transfusion   Type 2 diabetes mellitus treated with insulin (HCC) A1c 10%. On Lantus 23u daily at home, as well as Metformin, Trulicity. Glucose spiked in the past 24hr following surgery.  - Added back 15u Lantus this AM, will monitor sugars throughout today  - Continue moderate SSI   Left bundle branch block (LBBB) Appears new from  EKG 06/24. QRS duration prolonged, 130. No chest pain, SOB, dizziness to suggest ACS. QTc 513. - Will consider repeat troponins/EKG should he develop new symptomology - Avoid QTc prolonging medications where possible   Chronic and Stable Issues: COPD: Clinically silent. No SOB. HTN: BPs WNL. CTM. PAD: Continue home aspirin, Crestor 40 mg. Hx of osteomyelitis: X-ray negative for obvious infectious process. Could do further imaging/biopsy if indicated. BPH: Continue home finasteride, flomax.  FEN/GI: Carb modified  PPx: ASA 81mg , had persistent bleeding from left extremity following Lovenox at skilled rehab prior to this admission.  Dispo:SNF  likely tomorrow or the following day . Barriers include post-op recovery, pain control, lab monitoring.   Subjective:  NAEON. Does have some minimal pain to the site of his surgery which has been increasing throughout the morning but otherwise no acute complaints. Denies chest pain, shortness of breath, nausea, vomiting. Has not had a BM since surgery; however, has been passing gas.    Objective: Temp:  [97.8 F (36.6 C)-99.1 F (37.3 C)] 99.1 F (37.3 C) (02/27 0550) Pulse Rate:  [63-110] 83 (02/27 0550) Resp:  [15-18] 18 (02/27 0550) BP: (91-120)/(58-71) 107/65 (02/27 0550) SpO2:  [97 %-100 %] 99 % (02/27 0550)  Physical Exam: General: Resting comfortably in bed. NAD.  Cardiovascular: RRR. Systolic murmur present. No rubs, gallops.  Respiratory: Breathing comfortably on RA. No wheezes, rales, or rhonchi.  Abdomen: Soft, NTND.  Extremities: s/p bilateral BKAs. Left extremity with bandage and wound vac in place draining serosanguinous fluid.  Laboratory: Most recent CBC Lab Results  Component Value Date   WBC 4.8 08/28/2023   HGB 7.6 (L) 08/28/2023   HCT 23.5 (L) 08/28/2023   MCV 85.1 08/28/2023   PLT 127 (L) 08/28/2023   Most recent BMP    Latest Ref Rng & Units 08/28/2023    3:39 AM  BMP  Glucose 70 - 99 mg/dL 161   BUN 8 -  23 mg/dL 16   Creatinine 0.96 - 1.24 mg/dL 0.45   Sodium 409 - 811 mmol/L 135   Potassium 3.5 - 5.1 mmol/L 4.5   Chloride 98 - 111 mmol/L 96   CO2 22 - 32 mmol/L 24   Calcium 8.9 - 10.3 mg/dL 9.0     Other pertinent labs: Mg wnl    Imaging/Diagnostic Tests: No new imaging in the last 24hr.   Adam Ray, Medical Student 08/28/2023, 7:09 AM [facility] FPTS Intern pager: [phone], text pages welcome Secure chat group [facility] Teaching Service   I was personally present and performed or re-performed the history, physical exam and medical decision making activities of this student/resident and have verified that the student/resident's  findings are accurately documented in the student/resident's note. I have made edits and changes where appropriate, and agree with plan.  Adam Mans, MD, PGY-2 [facility] 12:43 PM 08/28/2023                   08/28/2023, 12:43 PM

## 2023-08-28 NOTE — Assessment & Plan Note (Addendum)
 A1c 10%. On Lantus 23u daily at home, as well as Metformin, Trulicity. Glucose spiked in the past 24hr following surgery.  - Added back 15u Lantus this AM, will monitor sugars throughout today  - Continue moderate SSI

## 2023-08-28 NOTE — Inpatient Diabetes Management (Signed)
 Inpatient Diabetes Program Recommendations  AACE/ADA: New Consensus Statement on Inpatient Glycemic Control (2015)  Target Ranges:  Prepandial:   less than 140 mg/dL      Peak postprandial:   less than 180 mg/dL (1-2 hours)      Critically ill patients:  140 - 180 mg/dL   Lab Results  Component Value Date   GLUCAP 228 (H) 08/28/2023   HGBA1C 10.0 (H) 08/01/2023    Review of Glycemic Control  Latest Reference Range & Units 08/27/23 08:03 08/27/23 11:33 08/27/23 14:30 08/27/23 16:37 08/27/23 17:29 08/27/23 21:27 08/27/23 23:12 08/28/23 03:21 08/28/23 08:09  Glucose-Capillary 70 - 99 mg/dL 409 (H) 811 (H) 914 (H) 114 (H) 144 (H) 402 (H) 371 (H) 373 (H) 228 (H)   Diabetes history: DM 2 Outpatient Diabetes medications: Trulicity 0.75 mg Weekly, Lantus 23 units, Metformin 850 mg bid Current orders for Inpatient glycemic control:  Novolog 0-15 units tid Lantus 15 units Daily  A1c  10% on 1/31  Inpatient Diabetes Program Recommendations:    Spoke with pt at bedside regarding A1c of 10% on 1/31 during prior hospitalization. Pt reports glucose trends are up and down since that time even after hospital and rehab stay. Reviewed glucose and A1c goals. Discussed his glucose trends increasing after surgery yesterday due to decadron 4 mg given. Will monitor trends. Pt reports having all needed supplies at home when discharged from SNF.   Thanks,  Christena Deem RN, MSN, BC-ADM Inpatient Diabetes Coordinator Team Pager [phone] (8a-5p)

## 2023-08-28 NOTE — TOC Initial Note (Signed)
 Transition of Care [facility]) - Initial/Assessment Note    Patient Details  Name: Adam Ray MRN: [id number] Date of Birth: April 18, 1956  [facility]) CM/SW Contact:    Mearl Latin, LCSW Phone Number: 08/28/2023, 3:36 PM  Clinical Narrative:                 CSW received consult for possible SNF placement at time of discharge. CSW spoke with patient. Patient reported that patient's spouse is currently unable to care for patient at their home given patient's current physical needs and fall risk. Patient expressed understanding of PT recommendation but is requesting inpatient rehab at [facility]. He called his children and they confirmed for CSW to send referral there and [facility]. They stated they stated they do not feel patient should go to SNF but rather inpatient rehab. CSW explained that IR level of care is not recommended so may face barriers with insurance, especially since patient just discharged from CIR. Patient remains adamant that one of his insurances will pay for it. CSW discussed insurance authorization process and will provide Medicare SNF ratings list. CSW also cautioned family that ambulance will not transport patient past 50 miles without up front payment. They will consider the options and see if patient can ride in a car at discharge. They report that it will be easier to have patient near them to give spouse a break.   -CSW left voicemail for [facility] [phone]) and faxed referral to [phone]. [phone].   -MSW Intern left voicemail for [facility] [phone]).   -MSW Intern spoke with Tobey Bride and emailed referral securely to [email or web address]  Skilled Nursing Rehab Facilities-   [email or web address]   Ratings out of 5 stars (5 the highest)   Name Address  Phone # Quality Care Staffing Health Inspection Overall  [facility] [address] [phone] 3 3 4 4   [facility] [address], [place] [phone] 5 1 4 4   [facility] [address], [facility] [phone] 2 2 2 2   [facility] [address], [place] [phone] 5 2 4 5   [facility]  [address], [place] [phone] 4 3 5 5   [facility] [address] [facility] [phone] 4 2 2 2   [facility] [address] [place] [phone] 5 1 2 2   [facility] [phone] [address] [place] [phone] 2 1 3 2   [address] ([facility]) [address] [place] [phone] 2 3 3 3   [facility] [address] [place], [place] [phone] 3 3 2 2   [facility] [address], [place] [phone] 3 1 1 1   Eligha Bridegroom [address] [place] [phone] 2 3 4 4   [facility] [address], [place] [phone] 4 4 3 3   [address] ([place]) [address], [place] [phone] 1 2 4 3           [facility] [address] [place] [phone] 2 1 4 3   [facility] [address] [place] [phone] 4 2 1 1   [facility]  [address] [place], [place] [address] [phone] 2 2 2 2   [facility] [place] [address] [phone] 3 2 4 4   [facility] [address], [place] [phone] 2 1 2 1   [facility] (No U[employer]) [address], [place] [phone] 5 4 5 5   [facility] [address], [employer]  [place] [phone] 3 4 2 2   [facility] [address] [place] [phone] 2 1 1 1   [facility] [address] Liliane Shi [phone] 4 2 5 5   [facility] [address], [place] [phone] 4 1 1 1   [facility] [address] [place], [place] [phone] 2 2 3 3           [facility]  [phone] 3 1 1 1   [facility] [address] [place]lyn Clines  [phone] 3 3 3 3   [facility] (No [employer]) [address], [place] [phone] 2 2 4 4   [facility]  [facility]) [address], [place] [phone] 2 1 1 1   [facility] [address] [place] [phone] 4 3 5 5   [facility] [address], [place] [phone] 1 1 1 1           [facility] [address] [place], [place] [phone] 5 4 5 5   [facility])  [address] [place] [phone] 1 1 2 1   [facility]) [address] [place], [place] [phone]  2 4 4   [facility] [address] [place] [phone] 3 5 5 5   [address] [place], [place] [phone] 4 2 2 2   [facility]) [address] [place] [phone] 2 1 3 2        Barriers to Discharge: Continued Medical Work up, English as a second language teacher, SNF Pending bed offer   Patient Goals and CMS Choice Patient states their goals for this hospitalization and ongoing recovery are:: Rehab CMS Medicare.gov Compare Post Acute Care list provided to:: Patient Choice offered to / list presented to : Patient [place] ownership interest in [facility].provided to:: Adult Children    Expected Discharge Plan and Services In-house Referral: Clinical Social Work   Post Acute Care Choice: IP Rehab Living arrangements for the past 2 months: Single Family Home (and CIR)                                      Prior Living Arrangements/Services Living arrangements for the past 2 months: Single Family Home (and CIR) Lives with:: Spouse Patient language and need for interpreter reviewed:: Yes Do you feel safe going back to the place where you live?: Yes      Need for Family Participation in Patient Care: Yes (Comment) Care giver support system in place?: Yes (comment) Current home services: DME Criminal Activity/Legal Involvement Pertinent to Current Situation/Hospitalization: No - Comment as needed  Activities of Daily Living   ADL Screening (condition at time of admission) Independently  performs ADLs?: No Does the patient have a NEW difficulty with bathing/dressing/toileting/self-feeding that is expected to last >3 days?: No (needs assist) Does the patient have a NEW difficulty with getting in/out of bed, walking, or climbing stairs that is expected to last >3 days?: No (needs assist) Does the patient have a NEW difficulty with communication that is expected to last >3 days?: No Is the patient deaf or have difficulty hearing?: No Does the patient have difficulty seeing, even when wearing glasses/contacts?: Yes Does the patient have difficulty concentrating, remembering, or making decisions?: No  Permission Sought/Granted Permission sought to share information with : Facility Medical sales representative, Family Supports Permission granted to share information with : Yes, Verbal Permission Granted     Permission granted to share info w AGENCY:  IR        Emotional Assessment Appearance:: Appears stated age Attitude/Demeanor/Rapport: Engaged Affect (typically observed): Accepting, Guarded, Appropriate Orientation: : Oriented to Self, Oriented to Place, Oriented to  Time, Oriented to Situation Alcohol / Substance Use: Not Applicable Psych Involvement: No (comment)  Admission diagnosis:  Hypomagnesemia [E83.42] Acute blood loss anemia [D62] LBBB (left bundle branch block) [I44.7] Leg wound, left [S81.802A] Type 2 diabetes mellitus with other diabetic arthropathy, with long-term current use of insulin (HCC) [Z61.096, Z79.4] Dehiscence of amputation stump of left lower extremity (HCC) [T87.81] Patient Active Problem List   Diagnosis Date Noted   Amputation of left lower extremity below knee (HCC) 08/26/2023   Type 2 diabetes mellitus treated with insulin (HCC) 08/26/2023   Left bundle branch block (LBBB) 08/26/2023   Acute blood loss anemia 08/26/2023   Dehiscence of amputation stump of left lower extremity (HCC) 08/25/2023   S/P BKA (below knee amputation) (HCC) 08/05/2023    Ganglion of foot, left 08/01/2023   S/P BKA (below knee amputation) unilateral, left (HCC) 08/01/2023   Routine adult health maintenance 04/30/2023   Heart murmur, systolic 03/13/2023   Pancytopenia (HCC) 02/18/2023   Bone lesion 02/18/2023   B12 deficiency anemia 02/18/2023   Wound dehiscence, surgical, initial encounter 12/25/2022   Maggot infestation 12/25/2022   Abnormal bone radiograph 12/18/2022   Osteomyelitis of second toe of left foot (HCC) 10/11/2022   Osteomyelitis of great toe of left foot (HCC) 10/11/2022   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   Atrophy of tongue papillae 10/02/2022   Atherosclerosis of coronary artery without angina pectoris 10/02/2022   Age-related nuclear cataract of left eye 10/02/2022   Brown hairy tongue 10/02/2022   Proliferative diabetic retinopathy of right eye associated with type 2 diabetes mellitus (HCC) 10/02/2022   Tongue discoloration 02/14/2022   Hematuria 08/16/2021   Cervical spondylosis without myelopathy 12/14/2020   Pain in joint of left shoulder 12/14/2020   Urinary incontinence 08/10/2020   Increased urinary frequency 05/09/2020   Nocturnal leg cramps 10/28/2019   Hyperlipidemia associated with type 2 diabetes mellitus (HCC) 05/26/2019   Limited mobility 05/12/2018   Anxiety state 06/02/2017   Leg swelling 01/29/2017   Idiopathic chronic venous hypertension of both lower extremities with inflammation 09/24/2016   Diabetic polyneuropathy associated with type 2 diabetes mellitus (HCC)    Type 2 diabetes mellitus with neurologic complication, with long-term current use of insulin (HCC) 12/08/2015   Left shoulder pain 08/10/2015   Pulmonary nodule 02/01/2015   Depression 12/09/2013   Warts, genital 08/20/2013   Diabetic retinopathy (HCC) 01/28/2013   Sleep apnea 09/06/2010   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 [place] [address] Phone: [phone] Fax: [phone]  [facility] [address] [place] [address] Phone: [phone] Fax: [phone]     Social Drivers of Health (SDOH) Social History: SDOH Screenings   Food Insecurity: No Food Insecurity (08/25/2023)  Housing: Low Risk  (08/25/2023)  Transportation Needs: Unmet Transportation Needs (08/25/2023)  Utilities: Not At Risk (08/25/2023)  Alcohol Screen: Low  Risk  (08/23/2022)  Depression (PHQ2-9): Low Risk  (05/05/2023)  Financial Resource Strain: Medium Risk (08/23/2022)  Physical Activity: Insufficiently Active (08/23/2022)  Social Connections: Moderately Integrated (08/25/2023)  Stress: No Stress Concern Present (08/23/2022)  Tobacco Use: Medium Risk (08/27/2023)   SDOH Interventions: Transportation Interventions: Inpatient TOC   Readmission Risk Interventions    08/26/2023    1:34 PM  Readmission Risk Prevention Plan  Transportation Screening Complete  PCP or Specialist Appt within 5-7 Days Complete  Home Care Screening Complete  Medication Review (RN CM) Referral to Pharmacy

## 2023-08-28 NOTE — Assessment & Plan Note (Addendum)
 Appears new from EKG 06/24. QRS duration prolonged, 130. No chest pain, SOB, dizziness to suggest ACS. QTc 513. - Will consider repeat troponins/EKG should he develop new symptomology - Avoid QTc prolonging medications where possible

## 2023-08-28 NOTE — Progress Notes (Signed)
 Patient ID: Adam Ray, male   DOB: 25-Apr-1956, 68 y.o.   MRN: [id number] Patient is postoperative day 1 status post revision left below-knee amputation.  There is 200 cc in the wound VAC canister with a good suction fit.  Patient will be able to discharge with the Prevena portable pump.  Patient states that he does not want to go to a nursing home but states he will go to a rehab facility that is in [facility].

## 2023-08-28 NOTE — Anesthesia Postprocedure Evaluation (Signed)
 Anesthesia Post Note  Patient: Adam Ray  Procedure(s) Performed: REVISION LEFT BELOW KNEE AMPUTATION (Left: Knee)     Patient location during evaluation: PACU Anesthesia Type: Regional and General Level of consciousness: awake and alert Pain management: pain level controlled Vital Signs Assessment: post-procedure vital signs reviewed and stable Respiratory status: spontaneous breathing, nonlabored ventilation, respiratory function stable and patient connected to nasal cannula oxygen Cardiovascular status: blood pressure returned to baseline and stable Postop Assessment: no apparent nausea or vomiting Anesthetic complications: no   No notable events documented.               Shelton Silvas

## 2023-08-28 NOTE — Plan of Care (Signed)
  Problem: Education: Goal: Ability to describe self-care measures that may prevent or decrease complications (Diabetes Survival Skills Education) will improve Outcome: Progressing Goal: Individualized Educational Video(s) Outcome: Progressing   Problem: Coping: Goal: Ability to adjust to condition or change in health will improve Outcome: Progressing   Problem: Fluid Volume: Goal: Ability to maintain a balanced intake and output will improve Outcome: Progressing   Problem: Health Behavior/Discharge Planning: Goal: Ability to identify and utilize available resources and services will improve Outcome: Progressing Goal: Ability to manage health-related needs will improve Outcome: Progressing   Problem: Metabolic: Goal: Ability to maintain appropriate glucose levels will improve Outcome: Progressing   Problem: Nutritional: Goal: Maintenance of adequate nutrition will improve Outcome: Progressing Goal: Progress toward achieving an optimal weight will improve Outcome: Progressing   Problem: Skin Integrity: Goal: Risk for impaired skin integrity will decrease Outcome: Progressing   Problem: Tissue Perfusion: Goal: Adequacy of tissue perfusion will improve Outcome: Progressing   Problem: Education: Goal: Knowledge of General Education information will improve Description: Including pain rating scale, medication(s)/side effects and non-pharmacologic comfort measures Outcome: Progressing   Problem: Health Behavior/Discharge Planning: Goal: Ability to manage health-related needs will improve Outcome: Progressing   Problem: Clinical Measurements: Goal: Ability to maintain clinical measurements within normal limits will improve Outcome: Progressing Goal: Will remain free from infection Outcome: Progressing Goal: Diagnostic test results will improve Outcome: Progressing Goal: Respiratory complications will improve Outcome: Progressing Goal: Cardiovascular complication will  be avoided Outcome: Progressing   Problem: Activity: Goal: Risk for activity intolerance will decrease Outcome: Progressing   Problem: Nutrition: Goal: Adequate nutrition will be maintained Outcome: Progressing   Problem: Coping: Goal: Level of anxiety will decrease Outcome: Progressing   Problem: Elimination: Goal: Will not experience complications related to bowel motility Outcome: Progressing Goal: Will not experience complications related to urinary retention Outcome: Progressing   Problem: Pain Managment: Goal: General experience of comfort will improve and/or be controlled Outcome: Progressing   Problem: Safety: Goal: Ability to remain free from injury will improve Outcome: Progressing   Problem: Skin Integrity: Goal: Risk for impaired skin integrity will decrease Outcome: Progressing   Problem: Education: Goal: Knowledge of the prescribed therapeutic regimen will improve Outcome: Progressing Goal: Ability to verbalize activity precautions or restrictions will improve Outcome: Progressing Goal: Understanding of discharge needs will improve Outcome: Progressing   Problem: Activity: Goal: Ability to perform//tolerate increased activity and mobilize with assistive devices will improve Outcome: Progressing   Problem: Clinical Measurements: Goal: Postoperative complications will be avoided or minimized Outcome: Progressing   Problem: Self-Care: Goal: Ability to meet self-care needs will improve Outcome: Progressing   Problem: Self-Concept: Goal: Ability to maintain and perform role responsibilities to the fullest extent possible will improve Outcome: Progressing   Problem: Pain Management: Goal: Pain level will decrease with appropriate interventions Outcome: Progressing   Problem: Skin Integrity: Goal: Demonstration of wound healing without infection will improve Outcome: Progressing

## 2023-08-28 NOTE — Progress Notes (Signed)
 Physical Therapy Treatment Patient Details Name: Adam Ray MRN: [id number] DOB: 11/16/1955 Today's Date: 08/28/2023   History of Present Illness Pt is a 68 y/o M admitted on 08/25/23 for another fall on L residual limb with wound dehiscence. 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 PMH: COPD, DM, HTN, PAD, anxiety, osteomyelitis, s/p B BKAs    PT Comments  Pt seen for PT tx with pt agreeable to sitting EOB with encouragement from PT. Pt is able to complete bed mobility with mod I with HOB elevated. Pt tolerated sitting EOB ~10 minutes without LOB, able to scoot to L along EOB with good use of BUE to clear buttocks without assistance. Pt is able to turn & scoot bed>recliner via A/P method with supervision. Pt has RLE prosthesis in room but declined multiple times to don it for standing transfers. Will continue to follow pt acutely to progress mobility as able.    If plan is discharge home, recommend the following: A lot of help with walking and/or transfers;A lot of help with bathing/dressing/bathroom;Assistance with cooking/housework;Assist for transportation;Help with stairs or ramp for entrance   Can travel by private vehicle     No  Equipment Recommendations  None recommended by PT (defer to next venue)    Recommendations for Other Services Rehab consult     Precautions / Restrictions Precautions Precautions: Fall Precaution/Restrictions Comments: LLE Wound Vac Restrictions Weight Bearing Restrictions Per Provider Order: No LLE Weight Bearing Per Provider Order:  (orders state WBAT but maintained NWB LLE throughout session)     Mobility  Bed Mobility   Bed Mobility: Supine to Sit     Supine to sit: Modified independent (Device/Increase time), HOB elevated, Used rails (semi fowler to long sitting with HOB elevated)          Transfers Overall transfer level: Needs assistance Equipment used: None Transfers: Bed to chair/wheelchair/BSC         Anterior-Posterior transfers:  Supervision (bed>recliner)        Ambulation/Gait                   Stairs             Wheelchair Mobility     Tilt Bed    Modified Rankin (Stroke Patients Only)       Balance Overall balance assessment: Needs assistance Sitting-balance support: Bilateral upper extremity supported, Feet unsupported Sitting balance-Leahy Scale: Good Sitting balance - Comments: Pt able to scoot laterally to L using BUE to clear buttocks from bed, no LOB, supervision.                                    Communication Communication Communication: No apparent difficulties  Cognition Arousal: Alert Behavior During Therapy: WFL for tasks assessed/performed   PT - Cognitive impairments: Awareness                       PT - Cognition Comments: Pt reports therapy needs to let pt rest & heal prior to starting therapy, PT attempts to educate pt on benefits of early mobilization. Pt tangential conversation throughout session. Following commands: Intact      Cueing    Exercises      General Comments        Pertinent Vitals/Pain Pain Assessment Pain Assessment: 0-10 Pain Score: 6  Pain Location: LLE surgical site Pain Descriptors / Indicators: Discomfort Pain Intervention(s):  Monitored during session    Home Living                          Prior Function            PT Goals (current goals can now be found in the care plan section) Acute Rehab PT Goals Patient Stated Goal: LLE healing PT Goal Formulation: With patient Time For Goal Achievement: 09/11/23 Potential to Achieve Goals: Good Progress towards PT goals: Progressing toward goals;Goals updated    Frequency    Min 1X/week      PT Plan      Co-evaluation              AM-PAC PT "6 Clicks" Mobility   Outcome Measure  Help needed turning from your back to your side while in a flat bed without using bedrails?: None Help needed moving from lying on your back to  sitting on the side of a flat bed without using bedrails?: A Little Help needed moving to and from a bed to a chair (including a wheelchair)?: A Little Help needed standing up from a chair using your arms (e.g., wheelchair or bedside chair)?: Total Help needed to walk in hospital room?: Total Help needed climbing 3-5 steps with a railing? : Total 6 Click Score: 13    End of Session   Activity Tolerance: Patient tolerated treatment well Patient left: in chair;with chair alarm set;with call bell/phone within reach Nurse Communication: Mobility status (blood transfusion IV leaking) PT Visit Diagnosis: Other abnormalities of gait and mobility (R26.89);Muscle weakness (generalized) (M62.81);Difficulty in walking, not elsewhere classified (R26.2);Pain;History of falling (Z91.81) Pain - Right/Left: Left Pain - part of body: Shoulder     Time: 9147-8295 PT Time Calculation (min) (ACUTE ONLY): 20 min  Charges:    $Therapeutic Activity: 8-22 mins PT General Charges $$ ACUTE PT VISIT: 1 Visit                     Aleda Grana, PT, DPT 08/28/23, 3:10 PM  Sandi Mariscal 08/28/2023, 3:10 PM

## 2023-08-29 DIAGNOSIS — D62 Acute posthemorrhagic anemia: Secondary | ICD-10-CM | POA: Diagnosis not present

## 2023-08-29 DIAGNOSIS — T8781 Dehiscence of amputation stump: Secondary | ICD-10-CM | POA: Diagnosis not present

## 2023-08-29 LAB — TYPE AND SCREEN
ABO/RH(D): B POS
Antibody Screen: NEGATIVE
Unit division: 0
Unit division: 0
Unit division: 0

## 2023-08-29 LAB — BASIC METABOLIC PANEL
Anion gap: 7 (ref 5–15)
BUN: 24 mg/dL — ABNORMAL HIGH (ref 8–23)
CO2: 28 mmol/L (ref 22–32)
Calcium: 8.2 mg/dL — ABNORMAL LOW (ref 8.9–10.3)
Chloride: 99 mmol/L (ref 98–111)
Creatinine, Ser: 0.94 mg/dL (ref 0.61–1.24)
GFR, Estimated: >60 mL/min (ref >60)
Glucose, Bld: 250 mg/dL — ABNORMAL HIGH (ref 70–99)
Potassium: 4.1 mmol/L (ref 3.5–5.1)
Sodium: 134 mmol/L — ABNORMAL LOW (ref 135–145)

## 2023-08-29 LAB — CBC
HCT: 24.3 % — ABNORMAL LOW (ref 39.0–52.0)
HCT: 25.3 % — ABNORMAL LOW (ref 39.0–52.0)
Hemoglobin: 8 g/dL — ABNORMAL LOW (ref 13.0–17.0)
Hemoglobin: 8.3 g/dL — ABNORMAL LOW (ref 13.0–17.0)
MCH: 27.5 pg (ref 26.0–34.0)
MCH: 27.8 pg (ref 26.0–34.0)
MCHC: 32.9 g/dL (ref 30.0–36.0)
MCHC: 32.8 g/dL (ref 30.0–36.0)
MCV: 83.5 fL (ref 80.0–100.0)
MCV: 84.6 fL (ref 80.0–100.0)
Platelets: 104 10*3/uL — ABNORMAL LOW (ref 150–400)
Platelets: 116 10*3/uL — ABNORMAL LOW (ref 150–400)
RBC: 2.91 MIL/uL — ABNORMAL LOW (ref 4.22–5.81)
RBC: 2.99 MIL/uL — ABNORMAL LOW (ref 4.22–5.81)
RDW: 16.4 % — ABNORMAL HIGH (ref 11.5–15.5)
RDW: 16.5 % — ABNORMAL HIGH (ref 11.5–15.5)
WBC: 4.3 10*3/uL (ref 4.0–10.5)
WBC: 4.5 10*3/uL (ref 4.0–10.5)
nRBC: 0.5 % — ABNORMAL HIGH (ref 0.0–0.2)
nRBC: 0 % (ref 0.0–0.2)

## 2023-08-29 LAB — BPAM RBC
Blood Product Expiration Date: 202503012359
Blood Product Expiration Date: 202503062359
Blood Product Expiration Date: 202503192359
ISSUE DATE / TIME: 202502250617
ISSUE DATE / TIME: 202502271159
ISSUE DATE / TIME: 202502241933
Unit Type and Rh: 7300
Unit Type and Rh: 9500
Unit Type and Rh: 7300

## 2023-08-29 LAB — GLUCOSE, CAPILLARY
Glucose-Capillary: 272 mg/dL — ABNORMAL HIGH (ref 70–99)
Glucose-Capillary: 232 mg/dL — ABNORMAL HIGH (ref 70–99)
Glucose-Capillary: 227 mg/dL — ABNORMAL HIGH (ref 70–99)
Glucose-Capillary: 168 mg/dL — ABNORMAL HIGH (ref 70–99)

## 2023-08-29 MED ORDER — INSULIN GLARGINE 100 UNIT/ML ~~LOC~~ SOLN
23.0000 [IU] | Freq: Every day | SUBCUTANEOUS | Status: DC
  Administered 2023-08-29 – 2023-08-31 (×3): 23 [IU] via SUBCUTANEOUS
  Filled 2023-08-29 (×4): qty 0.23

## 2023-08-29 MED ORDER — ACETAMINOPHEN 325 MG PO TABS
650.0000 mg | ORAL_TABLET | Freq: Four times a day (QID) | ORAL | Status: DC
  Administered 2023-08-29 – 2023-09-02 (×16): 650 mg via ORAL
  Filled 2023-08-29 (×16): qty 2

## 2023-08-29 MED ORDER — INSULIN GLARGINE 100 UNIT/ML ~~LOC~~ SOLN
20.0000 [IU] | Freq: Every day | SUBCUTANEOUS | Status: DC
  Filled 2023-08-29: qty 0.2

## 2023-08-29 NOTE — Consult Note (Signed)
 [facility] Liaison Consult Note    08/29/2023  KIJUAN GALLICCHIO [birth year] [id number]  Post op follow up:  Primary Care Provider:  Latrelle Dodrill, MD with [facility]  Insurance: [facility] Dual Complete  [facility] Liaison met with patient admitted to [facility] at the bedside.  Patient was napping but easily awaken.  Patient states he goes to the [place] in [place] and that he is hopeful to go to rehab when he is ready to leave the hospital.   Patient is being considered for a skilled nursing facility level of care for post hospital transition but also being considered for an inpatient rehab level of care for transition. Current plan not finalized at the time of this review.  Plan:  If transitions to affiliated SNF facility, the [facility] RN can be notified and follow for any known needs for transitional care needs for returning to post facility care coordination needs for returning to community.  For questions or referrals, please contact:  Charlesetta Shanks, RN, BSN, CCM [place]  [facility], [facility] Liaison Direct Dial: [phone] or secure chat Email: [email or web address]

## 2023-08-29 NOTE — Assessment & Plan Note (Signed)
 S/p 3uPRBCs.  Hemoglobin stable at 8.0, will repeat CBC this p.m. to assess for further need for transfusion. - Monitor AM CBCs - Transfusion threshold <8.0 hgb in setting of CAD - PM CBC check

## 2023-08-29 NOTE — Progress Notes (Signed)
 OT Cancellation Note  Patient Details Name: Adam Ray MRN: [id number] DOB: Jun 29, 1956   Cancelled Treatment:    Reason Eval/Treat Not Completed: Other (comment). Pt reports he is in too much pain to try and work with me. From med review he had tylenol around 13:00 and is due to tramadol around 15:00 (made RN aware he wants pain meds). Tried to explain to him that we really needed to get up and try to have him stand with his RLE prothesis (since it is now here) so we could show that he was making progress--he politely declined due to pain.   [name] OT Acute Rehabilitation Services Office [phone]    Evette Georges 08/29/2023, 2:46 PM

## 2023-08-29 NOTE — TOC Progression Note (Signed)
 Transition of Care [facility]) - Progression Note    Patient Details  Name: Adam Ray MRN: [id number] Date of Birth: 05-18-1956  Transition of Care [facility]) CM/SW Contact  Mearl Latin, LCSW Phone Number: 08/29/2023, 5:10 PM  Clinical Narrative:    [facility] Re[facility] contacted CSW and stated that patient is not appropriate for their level of care. No response yet from [employer] or [place]. CSW expanded SNF search to local options in the event family can agree to one of those.      Barriers to Discharge: Continued Medical Work up, English as a second language teacher, SNF Pending bed offer  Expected Discharge Plan and Services In-house Referral: Clinical Social Work   Post Acute Care Choice: IP Rehab Living arrangements for the past 2 months: Single Family Home (and CIR)                                       Social Determinants of Health (SDOH) Interventions SDOH Screenings   Food Insecurity: No Food Insecurity (08/25/2023)  Housing: Low Risk  (08/25/2023)  Transportation Needs: Unmet Transportation Needs (08/25/2023)  Utilities: Not At Risk (08/25/2023)  Alcohol Screen: Low Risk  (08/23/2022)  Depression (PHQ2-9): Low Risk  (05/05/2023)  Financial Resource Strain: Medium Risk (08/23/2022)  Physical Activity: Insufficiently Active (08/23/2022)  Social Connections: Moderately Integrated (08/25/2023)  Stress: No Stress Concern Present (08/23/2022)  Tobacco Use: Medium Risk (08/27/2023)    Readmission Risk Interventions    08/26/2023    1:34 PM  Readmission Risk Prevention Plan  Transportation Screening Complete  PCP or Specialist Appt within 5-7 Days Complete  Home Care Screening Complete  Medication Review (RN CM) Referral to Pharmacy

## 2023-08-29 NOTE — Progress Notes (Signed)
 Mobility Specialist Progress Note:   08/29/23 1100  Mobility  Activity Dangled on edge of bed  Level of Assistance Contact guard assist, steadying assist  Assistive Device Other (Comment) (bed rails + HHA)  Range of Motion/Exercises Active;All extremities  LLE Weight Bearing Per Provider Order NWB  Activity Response Tolerated well  Mobility Referral Yes  Mobility visit 1 Mobility  Mobility Specialist Start Time (ACUTE ONLY) [id number]  Mobility Specialist Stop Time (ACUTE ONLY) 0831  Mobility Specialist Time Calculation (min) (ACUTE ONLY) 8 min   Pt received in bed and agreeable. Declined scooting to chair this date. Pt able to come EOB w/ bed rails and HHA/CG. C/o some LLE pain that was persistent throughout the night. Performed leg raises, arm raises and flexion/extension across all planes. Pt left in bed with call bell and all needs met. Bed alarm on.  Adam Ray Mobility Specialist Please contact via Special educational needs teacher or Rehab office at [phone]

## 2023-08-29 NOTE — Assessment & Plan Note (Signed)
 A1c 10%. On Lantus 23u daily at home, as well as Metformin, Trulicity. Glucose spiked in the past 24hr following surgery.  Decreased into the mid 200s following addition of long-acting insulin yesterday. -15u Lantus daily, will consider increasing to 20 units - Continue moderate SSI

## 2023-08-29 NOTE — Assessment & Plan Note (Addendum)
 Secondary to fall from deconditioning and failure to properly transfer iso of recent surgery for L BKA. XR of the extremity without acute fracture. Picture in media of wound. Underwent flap revision in the OR on 02/26. - Ortho following, appreciate recs.  - POD#2, doing well      - Scheduled Tylenol 650 mg, Tramadol PRN for pain control - Working with PT/OT - Trend CBCs, BMPs - Continue Ancef today per ortho  - Follow wound cultures sent in OR

## 2023-08-29 NOTE — Plan of Care (Signed)
  Problem: Education: Goal: Ability to describe self-care measures that may prevent or decrease complications (Diabetes Survival Skills Education) will improve Outcome: Progressing   Problem: Coping: Goal: Ability to adjust to condition or change in health will improve Outcome: Progressing   Problem: Fluid Volume: Goal: Ability to maintain a balanced intake and output will improve Outcome: Progressing   Problem: Health Behavior/Discharge Planning: Goal: Ability to identify and utilize available resources and services will improve Outcome: Progressing Goal: Ability to manage health-related needs will improve Outcome: Progressing   Problem: Metabolic: Goal: Ability to maintain appropriate glucose levels will improve Outcome: Progressing   Problem: Nutritional: Goal: Maintenance of adequate nutrition will improve Outcome: Progressing Goal: Progress toward achieving an optimal weight will improve Outcome: Progressing   Problem: Skin Integrity: Goal: Risk for impaired skin integrity will decrease Outcome: Progressing   Problem: Tissue Perfusion: Goal: Adequacy of tissue perfusion will improve Outcome: Progressing   Problem: Education: Goal: Knowledge of General Education information will improve Description: Including pain rating scale, medication(s)/side effects and non-pharmacologic comfort measures Outcome: Progressing   Problem: Health Behavior/Discharge Planning: Goal: Ability to manage health-related needs will improve Outcome: Progressing   Problem: Clinical Measurements: Goal: Ability to maintain clinical measurements within normal limits will improve Outcome: Progressing Goal: Will remain free from infection Outcome: Progressing Goal: Diagnostic test results will improve Outcome: Progressing Goal: Respiratory complications will improve Outcome: Progressing Goal: Cardiovascular complication will be avoided Outcome: Progressing   Problem: Activity: Goal:  Risk for activity intolerance will decrease Outcome: Progressing   Problem: Nutrition: Goal: Adequate nutrition will be maintained Outcome: Progressing   Problem: Coping: Goal: Level of anxiety will decrease Outcome: Progressing   Problem: Elimination: Goal: Will not experience complications related to bowel motility Outcome: Progressing Goal: Will not experience complications related to urinary retention Outcome: Progressing   Problem: Pain Managment: Goal: General experience of comfort will improve and/or be controlled Outcome: Progressing   Problem: Safety: Goal: Ability to remain free from injury will improve Outcome: Progressing   Problem: Skin Integrity: Goal: Risk for impaired skin integrity will decrease Outcome: Progressing   Problem: Education: Goal: Knowledge of the prescribed therapeutic regimen will improve Outcome: Progressing Goal: Ability to verbalize activity precautions or restrictions will improve Outcome: Progressing Goal: Understanding of discharge needs will improve Outcome: Progressing   Problem: Activity: Goal: Ability to perform//tolerate increased activity and mobilize with assistive devices will improve Outcome: Progressing   Problem: Clinical Measurements: Goal: Postoperative complications will be avoided or minimized Outcome: Progressing   Problem: Self-Care: Goal: Ability to meet self-care needs will improve Outcome: Progressing   Problem: Self-Concept: Goal: Ability to maintain and perform role responsibilities to the fullest extent possible will improve Outcome: Progressing   Problem: Pain Management: Goal: Pain level will decrease with appropriate interventions Outcome: Progressing   Problem: Skin Integrity: Goal: Demonstration of wound healing without infection will improve Outcome: Progressing

## 2023-08-29 NOTE — Progress Notes (Signed)
 Daily Progress Note Intern Pager: [phone]  Patient name: Adam Ray Medical record number: [id number] Date of birth: Jan 01, 1956 Age: 68 y.o. Gender: male  Primary Care Provider: Latrelle Dodrill, MD Consultants: ortho Code Status: full  Pt Overview and Major Events to Date:  2/24: Admitted, s/p 1U PRBCs 2/25: S/p 1U PRBC 2/26: Underwent flap revision 2/27: S/p 1U PRBCs  Assessment and Plan:  This is a 68 year old male patient who presented status post fall at home with entrapment of recent left BKA with wound dehiscence and moderate blood loss, now postop day 2 following flap revision.  Past medical history includes PAD, T2DM, chronic pancytopenia, prior osteomyelitis, COPD, HTN, HLD, CAD, BPH.  Patient has bilateral BKA's. Assessment & Plan Dehiscence of amputation stump of left lower extremity (HCC) Secondary to fall from deconditioning and failure to properly transfer iso of recent surgery for L BKA. XR of the extremity without acute fracture. Picture in media of wound. Underwent flap revision in the OR on 02/26. - Ortho following, appreciate recs.  - POD#2, doing well      - Scheduled Tylenol 650 mg, Tramadol PRN for pain control - Working with PT/OT - Trend CBCs, BMPs - Continue Ancef today per ortho  - Follow wound cultures sent in OR   Acute blood loss anemia S/p 3uPRBCs.  Hemoglobin stable at 8.0, will repeat CBC this p.m. to assess for further need for transfusion. - Monitor AM CBCs - Transfusion threshold <8.0 hgb in setting of CAD - PM CBC check  Type 2 diabetes mellitus treated with insulin (HCC) A1c 10%. On Lantus 23u daily at home, as well as Metformin, Trulicity. Glucose spiked in the past 24hr following surgery.  Decreased into the mid 200s following addition of long-acting insulin yesterday. -15u Lantus daily, will consider increasing to 20 units - Continue moderate SSI   Left bundle branch block (LBBB) Appears new from EKG 06/24. QRS duration  prolonged, 130. No chest pain, SOB, dizziness to suggest ACS. QTc 513. - Will consider repeat troponins/EKG should he develop new symptomology - Avoid QTc prolonging medications where possible  Chronic and Stable Problems:  COPD: Clinically silent.  No SOB HTN: BPs within normal limits, CTM PAD: Continue home aspirin, Crestor 40 mg BPH: Continue home finasteride, Flomax.  FEN/GI: Carb modified PPx: ASA 81 mg Dispo:SNF in 2-3 days.   Subjective:  Patient is awake sitting up in bed eating breakfast this morning.  Complains that his right stump is bothering him but had not yet received any pain medications.  Objective: Temp:  [98.3 F (36.8 C)-98.5 F (36.9 C)] 98.3 F (36.8 C) (02/28 0538) Pulse Rate:  [77-111] 77 (02/28 0538) Resp:  [17-18] 18 (02/28 0538) BP: (91-121)/(57-68) 110/58 (02/28 0538) SpO2:  [95 %-100 %] 100 % (02/28 0538) Physical Exam: General: Elderly appearing, no distress Cardiovascular: RRR, no M/R/G Respiratory: CTAB, no increased work of breathing Abdomen: Flat, soft, nontender Extremities: Wound VAC in place on right BKA stump, left BKA stump intact.  Laboratory: Most recent CBC Lab Results  Component Value Date   WBC 4.3 08/29/2023   HGB 8.0 (L) 08/29/2023   HCT 24.3 (L) 08/29/2023   MCV 83.5 08/29/2023   PLT 104 (L) 08/29/2023   Most recent BMP    Latest Ref Rng & Units 08/29/2023    5:04 AM  BMP  Glucose 70 - 99 mg/dL 147   BUN 8 - 23 mg/dL 24   Creatinine 8.29 - 1.24 mg/dL 5.62  Sodium 135 - 145 mmol/L 134   Potassium 3.5 - 5.1 mmol/L 4.1   Chloride 98 - 111 mmol/L 99   CO2 22 - 32 mmol/L 28   Calcium 8.9 - 10.3 mg/dL 8.2    Gerrit Heck, DO 08/29/2023, 7:59 AM  PGY-1, [facility] FPTS Intern pager: [phone], text pages welcome Secure chat group [facility] Teaching Service

## 2023-08-29 NOTE — Assessment & Plan Note (Signed)
 Appears new from EKG 06/24. QRS duration prolonged, 130. No chest pain, SOB, dizziness to suggest ACS. QTc 513. - Will consider repeat troponins/EKG should he develop new symptomology - Avoid QTc prolonging medications where possible

## 2023-08-30 DIAGNOSIS — E11618 Type 2 diabetes mellitus with other diabetic arthropathy: Secondary | ICD-10-CM

## 2023-08-30 DIAGNOSIS — T8781 Dehiscence of amputation stump: Secondary | ICD-10-CM | POA: Diagnosis not present

## 2023-08-30 DIAGNOSIS — D62 Acute posthemorrhagic anemia: Secondary | ICD-10-CM | POA: Diagnosis not present

## 2023-08-30 DIAGNOSIS — Z794 Long term (current) use of insulin: Secondary | ICD-10-CM | POA: Diagnosis not present

## 2023-08-30 LAB — CULTURE, BLOOD (ROUTINE X 2)
Culture: NO GROWTH
Special Requests: ADEQUATE

## 2023-08-30 LAB — CBC
HCT: 24.8 % — ABNORMAL LOW (ref 39.0–52.0)
Hemoglobin: 8.1 g/dL — ABNORMAL LOW (ref 13.0–17.0)
MCH: 28.1 pg (ref 26.0–34.0)
MCHC: 32.7 g/dL (ref 30.0–36.0)
MCV: 86.1 fL (ref 80.0–100.0)
Platelets: 116 10*3/uL — ABNORMAL LOW (ref 150–400)
RBC: 2.88 MIL/uL — ABNORMAL LOW (ref 4.22–5.81)
RDW: 17.1 % — ABNORMAL HIGH (ref 11.5–15.5)
WBC: 3.5 10*3/uL — ABNORMAL LOW (ref 4.0–10.5)
nRBC: 0 % (ref 0.0–0.2)

## 2023-08-30 LAB — BASIC METABOLIC PANEL
Anion gap: 6 (ref 5–15)
BUN: 22 mg/dL (ref 8–23)
CO2: 28 mmol/L (ref 22–32)
Calcium: 8.7 mg/dL — ABNORMAL LOW (ref 8.9–10.3)
Chloride: 99 mmol/L (ref 98–111)
Creatinine, Ser: 0.85 mg/dL (ref 0.61–1.24)
GFR, Estimated: >60 mL/min (ref >60)
Glucose, Bld: 222 mg/dL — ABNORMAL HIGH (ref 70–99)
Potassium: 3.8 mmol/L (ref 3.5–5.1)
Sodium: 133 mmol/L — ABNORMAL LOW (ref 135–145)

## 2023-08-30 LAB — MAGNESIUM: Magnesium: 1.7 mg/dL (ref 1.7–2.4)

## 2023-08-30 LAB — GLUCOSE, CAPILLARY
Glucose-Capillary: 220 mg/dL — ABNORMAL HIGH (ref 70–99)
Glucose-Capillary: 207 mg/dL — ABNORMAL HIGH (ref 70–99)
Glucose-Capillary: 226 mg/dL — ABNORMAL HIGH (ref 70–99)
Glucose-Capillary: 196 mg/dL — ABNORMAL HIGH (ref 70–99)

## 2023-08-30 NOTE — Assessment & Plan Note (Signed)
 POD#3, doing well. Secondary to fall from deconditioning and failure to properly transfer iso of recent surgery for L BKA. XR of the extremity without acute fracture. Picture in media of wound. Underwent flap revision in the OR on 02/26. - Ortho following, appreciate recs.  - Scheduled Tylenol 650 mg, Tramadol PRN for pain control - Working with PT/OT - Follow wound cultures sent in OR

## 2023-08-30 NOTE — Assessment & Plan Note (Addendum)
 S/p 3u PRBCs.  Hemoglobin stable at 8.1. - Monitor AM CBCs - Transfusion threshold <8.0 hgb in setting of CAD

## 2023-08-30 NOTE — Assessment & Plan Note (Signed)
 A1c 10%. On Lantus 23u daily at home, as well as Metformin, Trulicity.  - Lantus 23u daily - Continue moderate SSI

## 2023-08-30 NOTE — Progress Notes (Signed)
 Mobility Specialist: Progress Note   08/30/23 1303  Mobility  Activity Transferred from bed to chair  Level of Assistance Standby assist, set-up cues, supervision of patient - no hands on  Activity Response Tolerated well  Mobility Referral Yes  Mobility visit 1 Mobility  Mobility Specialist Start Time (ACUTE ONLY) 1009  Mobility Specialist Stop Time (ACUTE ONLY) 1016  Mobility Specialist Time Calculation (min) (ACUTE ONLY) 7 min    Pt was agreeable to mobility session - received in bed. Transferred to the chair via posterior scoot with SV. No complaints. Left in chair with all needs met, call bell in reach.   Maurene Capes Mobility Specialist Please contact via SecureChat or Rehab office at [phone]

## 2023-08-30 NOTE — Assessment & Plan Note (Addendum)
 Appears new from EKG 06/24. QRS duration prolonged, 130. No chest pain, SOB, dizziness to suggest ACS. QTc 513. - Will consider repeat troponins/EKG should he develop new symptomology - Avoid QTc prolonging medications where possible

## 2023-08-30 NOTE — Plan of Care (Signed)

## 2023-08-30 NOTE — Progress Notes (Signed)
     Daily Progress Note Intern Pager: [phone]  Patient name: Adam Ray Medical record number: [id number] Date of birth: 1955/09/01 Age: 68 y.o. Gender: male  Primary Care Provider: Latrelle Dodrill, MD Consultants: Orthopedic surgery Code Status: Full  Pt Overview and Major Events to Date:  2/24: Admitted, s/p 1U PRBCs 2/25: S/p 1U PRBC 2/26: Underwent flap revision 2/27: S/p 1U PRBCs  Assessment and Plan: Adam Ray is a 68 y.o. male who is s/p day 3 of flap revision due to falling at home with entrapment of recent left BKA. Pertinent PMH/PSH includes PAD, T2DM, chronic pancytopenia, prior osteomyelitis, COPD, HTN, HLD, CAD, BPH. [name]  Assessment & Plan Dehiscence of amputation stump of left lower extremity (HCC) POD#3, doing well. Secondary to fall from deconditioning and failure to properly transfer iso of recent surgery for L BKA. XR of the extremity without acute fracture. Picture in media of wound. Underwent flap revision in the OR on 02/26. - Ortho following, appreciate recs.  - Scheduled Tylenol 650 mg, Tramadol PRN for pain control - Working with PT/OT - Follow wound cultures sent in OR  Acute blood loss anemia S/p 3u PRBCs.  Hemoglobin stable at 8.1. - Monitor AM CBCs - Transfusion threshold <8.0 hgb in setting of CAD Type 2 diabetes mellitus treated with insulin (HCC) A1c 10%. On Lantus 23u daily at home, as well as Metformin, Trulicity.  - Lantus 23u daily - Continue moderate SSI  Left bundle branch block (LBBB) Appears new from EKG 06/24. QRS duration prolonged, 130. No chest pain, SOB, dizziness to suggest ACS. QTc 513. - Will consider repeat troponins/EKG should he develop new symptomology - Avoid QTc prolonging medications where possible  Chronic and Stable Issues: COPD: Clinically silent HTN: BPs within normal limits, CTM PAD: Continue home aspirin, Crestor 40 mg BPH: Continue home finasteride, Flomax, myrbetriq.  FEN/GI: Carb modified PPx: b/l  BKA, ASA Dispo:SNF in 2-3 days. Barriers include placement.   Subjective:  No complaints or concerns this morning.  States he is not in any pain at this time.  Objective: Temp:  [98 F (36.7 C)-98.4 F (36.9 C)] 98.3 F (36.8 C) (03/01 0429) Pulse Rate:  [62-92] 62 (03/01 0429) Resp:  [18] 18 (03/01 0429) BP: (95-107)/(56-68) 95/56 (03/01 0429) SpO2:  [96 %-100 %] 97 % (03/01 0429) Physical Exam: General: Appearing as stated age, NAD Cardiovascular: RRR, 3/6 holosystolic murmur auscultated Respiratory: CTAB, normal WOB Extremities: Bilateral BKA, wound VAC in place on right BKA stump  Laboratory: Most recent CBC Lab Results  Component Value Date   WBC 3.5 (L) 08/30/2023   HGB 8.1 (L) 08/30/2023   HCT 24.8 (L) 08/30/2023   MCV 86.1 08/30/2023   PLT 116 (L) 08/30/2023   Most recent BMP    Latest Ref Rng & Units 08/30/2023    4:07 AM  BMP  Glucose 70 - 99 mg/dL 629   BUN 8 - 23 mg/dL 22   Creatinine 5.28 - 1.24 mg/dL 4.13   Sodium 244 - 010 mmol/L 133   Potassium 3.5 - 5.1 mmol/L 3.8   Chloride 98 - 111 mmol/L 99   CO2 22 - 32 mmol/L 28   Calcium 8.9 - 10.3 mg/dL 8.7    Blood culture NGTD x 4 days  Imaging/Diagnostic Tests: No recent imaging results.  Shelby Mattocks, DO 08/30/2023, 6:49 AM  PGY-3, [place] [facility] FPTS Intern pager: [phone], text pages welcome Secure chat group [facility] Teaching Service

## 2023-08-31 DIAGNOSIS — E11618 Type 2 diabetes mellitus with other diabetic arthropathy: Secondary | ICD-10-CM | POA: Diagnosis not present

## 2023-08-31 DIAGNOSIS — D62 Acute posthemorrhagic anemia: Secondary | ICD-10-CM | POA: Diagnosis not present

## 2023-08-31 DIAGNOSIS — Z794 Long term (current) use of insulin: Secondary | ICD-10-CM | POA: Diagnosis not present

## 2023-08-31 DIAGNOSIS — T8781 Dehiscence of amputation stump: Secondary | ICD-10-CM | POA: Diagnosis not present

## 2023-08-31 LAB — GLUCOSE, CAPILLARY
Glucose-Capillary: 195 mg/dL — ABNORMAL HIGH (ref 70–99)
Glucose-Capillary: 249 mg/dL — ABNORMAL HIGH (ref 70–99)
Glucose-Capillary: 278 mg/dL — ABNORMAL HIGH (ref 70–99)

## 2023-08-31 LAB — CULTURE, BLOOD (ROUTINE X 2)
Culture: NO GROWTH
Special Requests: ADEQUATE

## 2023-08-31 NOTE — Assessment & Plan Note (Addendum)
 POD#4. Pain improving and controlled. - Ortho following, appreciate recs.  - Scheduled Tylenol 650 mg, Tramadol PRN for pain control - PT/OT - Surgical wound Cx NGTD

## 2023-08-31 NOTE — Assessment & Plan Note (Addendum)
 Appears new from EKG 06/24. QRS duration prolonged, 130.  - Will consider repeat troponins/EKG should he develop new symptomology - Avoid QTc prolonging medications where possible

## 2023-08-31 NOTE — Assessment & Plan Note (Addendum)
 Adequately controlled - Lantus 23u daily - Continue moderate SSI  - Holding home metformin and trulicity while admitted

## 2023-08-31 NOTE — Progress Notes (Signed)
     Daily Progress Note Intern Pager: [phone]  Patient name: Adam Ray Medical record number: [id number] Date of birth: 05/27/1956 Age: 68 y.o. Gender: male  Primary Care Provider: Latrelle Dodrill, MD Consultants: Orthopedic surgery Code Status: Full  Pt Overview and Major Events to Date:  2/24: Admitted, s/p 1U PRBCs 2/25: S/p 1U PRBC 2/26: Underwent flap revision 2/27: S/p 1U PRBCs  Assessment and Plan: Adam Ray is a 68 y.o. male who is POD#4 of flap revision due to falling at home with entrapment of recent left BKA.   Pertinent PMH/PSH includes PAD, T2DM, chronic pancytopenia, prior osteomyelitis, COPD, HTN, HLD, CAD, BPH. Assessment & Plan Dehiscence of amputation stump of left lower extremity (HCC) POD#4. Pain improving and controlled. - Ortho following, appreciate recs.  - Scheduled Tylenol 650 mg, Tramadol PRN for pain control - PT/OT - Surgical wound Cx NGTD Acute blood loss anemia S/p 3u PRBCs. No evidence of active bleed.  - Monitor AM CBCs - Transfusion threshold <8.0 hgb in setting of CAD Diabetes mellitus (HCC) Adequately controlled - Lantus 23u daily - Continue moderate SSI  - Holding home metformin and trulicity while admitted Left bundle branch block (LBBB) Appears new from EKG 06/24. QRS duration prolonged, 130.  - Will consider repeat troponins/EKG should he develop new symptomology - Avoid QTc prolonging medications where possible  Chronic and Stable Problems:  COPD: Clinically silent HTN: BPs within normal limits, CTM PAD: Continue home aspirin, Crestor 40 mg BPH: Continue home finasteride, Flomax, myrbetriq.   FEN/GI: Carb modified PPx: b/l BKA, ASA Dispo:SNF in 2-3 days. Barriers include placement.    Subjective:  Reports pain is improving. Eating and drinking well. Having BM's.   Objective: Temp:  [98.3 F (36.8 C)-98.4 F (36.9 C)] 98.4 F (36.9 C) (03/02 0419) Pulse Rate:  [67-93] 67 (03/02 0419) Resp:  [16-17] 16  (03/02 0419) BP: (105-115)/(60-66) 108/60 (03/02 0419) SpO2:  [96 %-98 %] 96 % (03/02 0419) Physical Exam: General: Alert man laying comfortably in bed. NAD HEENT: NCAT. MMM. Cardiovascular: RRR. Respiratory: CTAB. Normal WOB on RA. Abdomen: Soft, nontender, nondistended.  Laboratory: Most recent CBC Lab Results  Component Value Date   WBC 3.5 (L) 08/30/2023   HGB 8.1 (L) 08/30/2023   HCT 24.8 (L) 08/30/2023   MCV 86.1 08/30/2023   PLT 116 (L) 08/30/2023   Most recent BMP    Latest Ref Rng & Units 08/30/2023    4:07 AM  BMP  Glucose 70 - 99 mg/dL 846   BUN 8 - 23 mg/dL 22   Creatinine 9.62 - 1.24 mg/dL 9.52   Sodium 841 - 324 mmol/L 133   Potassium 3.5 - 5.1 mmol/L 3.8   Chloride 98 - 111 mmol/L 99   CO2 22 - 32 mmol/L 28   Calcium 8.9 - 10.3 mg/dL 8.7      Lincoln Brigham, MD 08/31/2023, 7:27 AM  PGY-2, [facility] FPTS Intern pager: [phone], text pages welcome Secure chat group [facility] Teaching Service

## 2023-08-31 NOTE — Progress Notes (Signed)
 Mobility Specialist: Progress Note   08/31/23 1556  Mobility  Activity Transferred from chair to bed  Level of Assistance Standby assist, set-up cues, supervision of patient - no hands on  Activity Response Tolerated well  Mobility Referral Yes  Mobility visit 1 Mobility  Mobility Specialist Start Time (ACUTE ONLY) 1420  Mobility Specialist Stop Time (ACUTE ONLY) 1435  Mobility Specialist Time Calculation (min) (ACUTE ONLY) 15 min    Pt requesting to return back to bed - received in chair. Completed anterior scoot transfer with SV; extra time and effort needed. No complaints. Left in bed with all needs met, call bell in reach. Bed alarm on.   Maurene Capes Mobility Specialist Please contact via SecureChat or Rehab office at [phone]

## 2023-08-31 NOTE — Progress Notes (Signed)
 Patient ID: Adam Ray, male   DOB: 03-18-1956, 68 y.o.   MRN: [id number] Patient is up sitting in a chair.  There is 250 cc in the wound VAC canister.  There is a good suction fit.  Patient will require discharge to skilled nursing facility.

## 2023-08-31 NOTE — Assessment & Plan Note (Addendum)
 S/p 3u PRBCs. No evidence of active bleed.  - Monitor AM CBCs - Transfusion threshold <8.0 hgb in setting of CAD

## 2023-08-31 NOTE — Plan of Care (Signed)

## 2023-09-01 ENCOUNTER — Ambulatory Visit: Payer: No Typology Code available for payment source | Admitting: Orthopedic Surgery

## 2023-09-01 DIAGNOSIS — T8781 Dehiscence of amputation stump: Secondary | ICD-10-CM | POA: Diagnosis not present

## 2023-09-01 LAB — BASIC METABOLIC PANEL
Anion gap: 9 (ref 5–15)
BUN: 22 mg/dL (ref 8–23)
CO2: 25 mmol/L (ref 22–32)
Calcium: 8.6 mg/dL — ABNORMAL LOW (ref 8.9–10.3)
Chloride: 101 mmol/L (ref 98–111)
Creatinine, Ser: 0.83 mg/dL (ref 0.61–1.24)
GFR, Estimated: >60 mL/min (ref >60)
Glucose, Bld: 247 mg/dL — ABNORMAL HIGH (ref 70–99)
Potassium: 3.6 mmol/L (ref 3.5–5.1)
Sodium: 135 mmol/L (ref 135–145)

## 2023-09-01 LAB — GLUCOSE, CAPILLARY
Glucose-Capillary: 234 mg/dL — ABNORMAL HIGH (ref 70–99)
Glucose-Capillary: 257 mg/dL — ABNORMAL HIGH (ref 70–99)
Glucose-Capillary: 239 mg/dL — ABNORMAL HIGH (ref 70–99)
Glucose-Capillary: 163 mg/dL — ABNORMAL HIGH (ref 70–99)

## 2023-09-01 LAB — CBC
HCT: 26.3 % — ABNORMAL LOW (ref 39.0–52.0)
Hemoglobin: 8.2 g/dL — ABNORMAL LOW (ref 13.0–17.0)
MCH: 27.5 pg (ref 26.0–34.0)
MCHC: 31.2 g/dL (ref 30.0–36.0)
MCV: 88.3 fL (ref 80.0–100.0)
Platelets: 125 10*3/uL — ABNORMAL LOW (ref 150–400)
RBC: 2.98 MIL/uL — ABNORMAL LOW (ref 4.22–5.81)
RDW: 16.9 % — ABNORMAL HIGH (ref 11.5–15.5)
WBC: 3.4 10*3/uL — ABNORMAL LOW (ref 4.0–10.5)
nRBC: 0 % (ref 0.0–0.2)

## 2023-09-01 LAB — AEROBIC/ANAEROBIC CULTURE W GRAM STAIN (SURGICAL/DEEP WOUND): Culture: NO GROWTH

## 2023-09-01 LAB — MAGNESIUM: Magnesium: 1.7 mg/dL (ref 1.7–2.4)

## 2023-09-01 MED ORDER — INSULIN ASPART 100 UNIT/ML IJ SOLN
0.0000 [IU] | Freq: Three times a day (TID) | INTRAMUSCULAR | Status: DC
  Administered 2023-09-01 – 2023-09-02 (×2): 7 [IU] via SUBCUTANEOUS
  Administered 2023-09-02: 3 [IU] via SUBCUTANEOUS
  Administered 2023-09-02: 4 [IU] via SUBCUTANEOUS

## 2023-09-01 MED ORDER — INSULIN GLARGINE 100 UNIT/ML ~~LOC~~ SOLN
28.0000 [IU] | Freq: Every day | SUBCUTANEOUS | Status: DC
  Administered 2023-09-01: 28 [IU] via SUBCUTANEOUS
  Filled 2023-09-01 (×2): qty 0.28

## 2023-09-01 MED ORDER — POLYETHYLENE GLYCOL 3350 17 G PO PACK
17.0000 g | PACK | Freq: Once | ORAL | Status: AC
  Administered 2023-09-01: 17 g via ORAL
  Filled 2023-09-01: qty 1

## 2023-09-01 NOTE — Progress Notes (Signed)
 Occupational Therapy Treatment Patient Details Name: Adam Ray MRN: [id number] DOB: 11/04/1955 Today's Date: 09/01/2023   History of present illness Pt is a 68 y/o M admitted on 08/25/23 for another fall on L residual limb with wound dehiscence. 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 PMH: COPD, DM, HTN, PAD, anxiety, osteomyelitis, s/p B BKAs   OT comments  Pt greeted supine in bed with pt eager to mobilize OOB. Pt currently requires CGA for bed mobility, CGA for lateral scoot from EOB and MODA for LB ADLS. Unable to don prosthetic on RLE this session d/t poor fit. Pt reports he is missing a shrinker for RLE however unsure of the validity of this statement. Patient will benefit from continued inpatient follow up therapy, <3 hours/day       If plan is discharge home, recommend the following:  A little help with walking and/or transfers;A little help with bathing/dressing/bathroom;Assistance with cooking/housework;Help with stairs or ramp for entrance;Assist for transportation   Equipment Recommendations  Other (comment) (TBD)    Recommendations for Other Services      Precautions / Restrictions Precautions Precautions: Fall Precaution/Restrictions Comments: LLE Wound Vac; shrinker' prosthetic for RLE Restrictions Weight Bearing Restrictions Per Provider Order: Yes LLE Weight Bearing Per Provider Order: Non weight bearing       Mobility Bed Mobility Overal bed mobility: Needs Assistance Bed Mobility: Supine to Sit, Sit to Supine, Rolling Rolling: Supervision   Supine to sit: Contact guard Sit to supine: Contact guard assist   General bed mobility comments: use of bed rails/features, CGA for safety, pt rolling R<>L to don underwear with supervision    Transfers Overall transfer level: Needs assistance Equipment used: None               General transfer comment: pt completed lateral scoots to [facility] with CGA for safety, unable to stand d/t poor fitting prosthetic      Balance Overall balance assessment: Needs assistance Sitting-balance support: Single extremity supported, Feet unsupported Sitting balance-Leahy Scale: Fair Sitting balance - Comments: sitting EOB for ~ 10 mins attempting to don prosthetic no LOB                                   ADL either performed or assessed with clinical judgement   ADL Overall ADL's : Needs assistance/impaired                     Lower Body Dressing: Moderate assistance;Bed level Lower Body Dressing Details (indicate cue type and reason): to don underwear from bed level, pt rolling R<>L to pull underwear to baseline   Toilet Transfer Details (indicate cue type and reason): unable to transfer d/t poor fitting prosthetic on RLE         Functional mobility during ADLs: Contact guard assist (bed mobility and lateral scoots on EOB) General ADL Comments: ADL participation impacted by poor fitting prosthetic on RLE today, pt motivated to work on functional mobility however limited to bed level d/t poor fitting prosthetic    Extremity/Trunk Assessment Upper Extremity Assessment Upper Extremity Assessment: Overall WFL for tasks assessed   Lower Extremity Assessment Lower Extremity Assessment: LLE deficits/detail LLE Deficits / Details: BKA LLE: Unable to fully assess due to immobilization LLE Sensation: WNL LLE Coordination: decreased gross motor   Cervical / Trunk Assessment Cervical / Trunk Assessment: Normal    Vision Baseline Vision/History: 1 Wears glasses Ability to  See in Adequate Light: 2 Moderately impaired Patient Visual Report: No change from baseline     Perception Perception Perception: Not tested   Praxis Praxis Praxis: Not tested   Communication Communication Communication: No apparent difficulties   Cognition Arousal: Alert Behavior During Therapy: WFL for tasks assessed/performed Cognition: No apparent impairments             OT - Cognition Comments:  cognition not formally challenged                 Following commands: Intact        Cueing   Cueing Techniques: Verbal cues  Exercises      Shoulder Instructions       General Comments unable to don prosthetic despite multiple attempts, pt reports he is missing shrinker for RLE however this pt is familiar to this therapist and do not recall him wearing shrinker in addition to socket sleeve.    Pertinent Vitals/ Pain       Pain Assessment Pain Assessment: No/denies pain  Home Living                                          Prior Functioning/Environment              Frequency  Min 1X/week        Progress Toward Goals  OT Goals(current goals can now be found in the care plan section)  Progress towards OT goals: Progressing toward goals  Acute Rehab OT Goals Patient Stated Goal: to get his prosthetic to fit and go to rehab OT Goal Formulation: With patient Time For Goal Achievement: 09/08/23 Potential to Achieve Goals: Good  Plan      Co-evaluation                 AM-PAC OT "6 Clicks" Daily Activity     Outcome Measure   Help from another person eating meals?: None Help from another person taking care of personal grooming?: A Little Help from another person toileting, which includes using toliet, bedpan, or urinal?: A Little Help from another person bathing (including washing, rinsing, drying)?: A Little Help from another person to put on and taking off regular upper body clothing?: A Little Help from another person to put on and taking off regular lower body clothing?: A Lot 6 Click Score: 18    End of Session    OT Visit Diagnosis: Other abnormalities of gait and mobility (R26.89);Muscle weakness (generalized) (M62.81)   Activity Tolerance Patient tolerated treatment well   Patient Left in bed;with call bell/phone within reach;with bed alarm set   Nurse Communication Mobility status        Time: 8295-6213 OT  Time Calculation (min): 35 min  Charges: OT General Charges $OT Visit: 1 Visit OT Treatments $Self Care/Home Management : 23-37 mins  Adam Derrick., COTA/L Acute Rehabilitation Services [phone]   Adam Ray 09/01/2023, 10:28 AM

## 2023-09-01 NOTE — Assessment & Plan Note (Addendum)
 Appears new from EKG 06/24. QRS duration prolonged, 130.  - Will consider repeat troponins/EKG should he develop new symptomology - Avoid QTc prolonging medications where possible

## 2023-09-01 NOTE — Progress Notes (Signed)
 Physical Therapy Treatment  Patient Details Name: Adam Ray MRN: [id number] DOB: 19-Dec-1955 Today's Date: 09/01/2023   History of Present Illness Pt is a 68 y/o M admitted on 08/25/23 for another fall on L residual limb with wound dehiscence. 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 PMH: COPD, DM, HTN, PAD, anxiety, osteomyelitis, s/p B BKAs    PT Comments  Pt initially reporting that a shrinker has been ordered for the R residual limb and pt was adamant that the prosthesis was not going to fit until he got the shrinker. Upon further investigation, pt states the shrinker was ordered for the L residual limb, not the R, and he has not had a shrinker for the R residual limb. Pt states initially that he had not had the prosthesis donned in a few days, and later reports a longer timeframe of "a week or so". Encouraged donning of prosthesis and we were able to get it locked into place with standing at EOB with RW. Once donned, +2 assist provided for transition to chair. Will continue to follow and progress as able per POC.     If plan is discharge home, recommend the following: A lot of help with walking and/or transfers;A lot of help with bathing/dressing/bathroom;Assistance with cooking/housework;Assist for transportation;Help with stairs or ramp for entrance   Can travel by private vehicle     No  Equipment Recommendations  None recommended by PT (defer to next venue)    Recommendations for Other Services Rehab consult     Precautions / Restrictions Precautions Precautions: Fall Precaution/Restrictions Comments: LLE Wound Vac; shrinker L; prosthetic for RLE Required Braces or Orthoses: Other Brace Other Brace: LLE Limb Guard Restrictions Weight Bearing Restrictions Per Provider Order: Yes LLE Weight Bearing Per Provider Order: Non weight bearing     Mobility  Bed Mobility Overal bed mobility: Needs Assistance Bed Mobility: Supine to Sit, Rolling Rolling: Supervision   Supine to sit:  Supervision     General bed mobility comments: Pt able to transition to EOB without assistance. Increased time and use of rails required. Pt demonstrating good scooting technique to position at EOB.    Transfers Overall transfer level: Needs assistance Equipment used: Rolling walker (2 wheels) Transfers: Bed to chair/wheelchair/BSC, Sit to/from Stand Sit to Stand: Min assist, Mod assist, +2 physical assistance, +2 safety/equipment, From elevated surface   Step pivot transfers: Mod assist, +2 physical assistance, +2 safety/equipment, From elevated surface       General transfer comment: Pt able to get prosthetic donned and +2 assist required for pivot from bed>chair. Pt took 2 good hops to position himself in front of the chair to prepare for stand>sit.    Ambulation/Gait                   Stairs             Wheelchair Mobility     Tilt Bed    Modified Rankin (Stroke Patients Only)       Balance Overall balance assessment: Needs assistance Sitting-balance support: Single extremity supported, Feet unsupported Sitting balance-Leahy Scale: Fair   Postural control: Posterior lean Standing balance support: Bilateral upper extremity supported Standing balance-Leahy Scale: Poor Standing balance comment: Patient required additonal support due to LLE NWB status and RLE prosthesis                            Communication Communication Communication: No apparent difficulties Factors Affecting Communication:  (  sometimes difficult to understand)  Cognition Arousal: Alert Behavior During Therapy: WFL for tasks assessed/performed   PT - Cognitive impairments: Awareness                         Following commands: Intact      Cueing Cueing Techniques: Verbal cues  Exercises      General Comments        Pertinent Vitals/Pain Pain Assessment Pain Assessment: Faces Faces Pain Scale: Hurts little more Pain Location: LLE surgical  site Pain Descriptors / Indicators: Discomfort Pain Intervention(s): Limited activity within patient's tolerance, Monitored during session, Repositioned    Home Living                          Prior Function            PT Goals (current goals can now be found in the care plan section) Acute Rehab PT Goals Patient Stated Goal: LLE healing PT Goal Formulation: With patient Time For Goal Achievement: 09/11/23 Potential to Achieve Goals: Good Progress towards PT goals: Progressing toward goals    Frequency    Min 1X/week      PT Plan      Co-evaluation              AM-PAC PT "6 Clicks" Mobility   Outcome Measure  Help needed turning from your back to your side while in a flat bed without using bedrails?: None Help needed moving from lying on your back to sitting on the side of a flat bed without using bedrails?: A Little Help needed moving to and from a bed to a chair (including a wheelchair)?: A Little Help needed standing up from a chair using your arms (e.g., wheelchair or bedside chair)?: Total Help needed to walk in hospital room?: Total Help needed climbing 3-5 steps with a railing? : Total 6 Click Score: 13    End of Session Equipment Utilized During Treatment: Gait belt Activity Tolerance: Patient tolerated treatment well Patient left: in chair;with chair alarm set;with call bell/phone within reach Nurse Communication: Mobility status PT Visit Diagnosis: Other abnormalities of gait and mobility (R26.89);Muscle weakness (generalized) (M62.81);Difficulty in walking, not elsewhere classified (R26.2);Pain;History of falling (Z91.81) Pain - Right/Left: Left Pain - part of body: Shoulder     Time: 1343-1411 PT Time Calculation (min) (ACUTE ONLY): 28 min  Charges:    $Gait Training: 8-22 mins $Therapeutic Activity: 8-22 mins PT General Charges $$ ACUTE PT VISIT: 1 Visit                     Conni Slipper, PT, DPT Acute Rehabilitation  Services Secure Chat Preferred Office: [phone]    Marylynn Pearson 09/01/2023, 3:24 PM

## 2023-09-01 NOTE — Assessment & Plan Note (Signed)
 POD#5. Pain improving and controlled. - Ortho following, appreciate recs.  - Scheduled Tylenol 650 mg, Tramadol PRN for pain control - PT/OT - Surgical wound Cx NGTD

## 2023-09-01 NOTE — Assessment & Plan Note (Addendum)
 CBG somewhat elevated. - Increase Lantus to 28u daily - Continue moderate SSI  - Holding home metformin and trulicity while admitted

## 2023-09-01 NOTE — TOC Progression Note (Signed)
 Transition of Care [facility]) - Progression Note    Patient Details  Name: Adam Ray MRN: [id number] Date of Birth: 07-Aug-1955  [facility] of [facility]) CM/SW Contact  Delilah Shan, LCSWA Phone Number: 09/01/2023, 5:08 PM  Clinical Narrative:     CSW spoke with patient and provided SNF bed offers. Patient still decided on SNF choice. CSW will follow up with patient on SNF choice.    Barriers to Discharge: Continued Medical Work up, English as a second language teacher, SNF Pending bed offer  Expected Discharge Plan and Services In-house Referral: Clinical Social Work   Post Acute Care Choice: IP Rehab Living arrangements for the past 2 months: Single Family Home (and CIR)                                       Social Determinants of Health (SDOH) Interventions SDOH Screenings   Food Insecurity: No Food Insecurity (08/25/2023)  Housing: Low Risk  (08/25/2023)  Transportation Needs: Unmet Transportation Needs (08/25/2023)  Utilities: Not At Risk (08/25/2023)  Alcohol Screen: Low Risk  (08/23/2022)  Depression (PHQ2-9): Low Risk  (05/05/2023)  Financial Resource Strain: Medium Risk (08/23/2022)  Physical Activity: Insufficiently Active (08/23/2022)  Social Connections: Moderately Integrated (08/25/2023)  Stress: No Stress Concern Present (08/23/2022)  Tobacco Use: Medium Risk (08/27/2023)    Readmission Risk Interventions    08/26/2023    1:34 PM  Readmission Risk Prevention Plan  Transportation Screening Complete  PCP or Specialist Appt within 5-7 Days Complete  Home Care Screening Complete  Medication Review (RN CM) Referral to Pharmacy

## 2023-09-01 NOTE — Assessment & Plan Note (Signed)
 S/p 3u PRBCs. No evidence of active bleed.  - Monitor AM CBCs - Transfusion threshold <8.0 hgb in setting of CAD

## 2023-09-01 NOTE — Progress Notes (Signed)
 Patient refused CPAP for the night

## 2023-09-01 NOTE — Progress Notes (Signed)
     Daily Progress Note Intern Pager: [phone]  Patient name: Adam Ray Medical record number: [id number] Date of birth: [birth year] Age: 68 y.o. Gender: male  Primary Care Provider: Latrelle Dodrill, MD Consultants: Orthopedic surgery Code Status: Full  Pt Overview and Major Events to Date:  2/24: Admitted, s/p 1U PRBCs 2/25: S/p 1U PRBC 2/26: Underwent flap revision 2/27: S/p 1U PRBCs  Assessment and Plan: Adam Ray is a 68 y.o. male who is POD#5 of flap revision due to falling at home with entrapment of recent left BKA.  Patient is doing very well overall today and awaiting placement for discharge.  Assessment & Plan Dehiscence of amputation stump of left lower extremity (HCC) POD#5. Pain improving and controlled. - Ortho following, appreciate recs.  - Scheduled Tylenol 650 mg, Tramadol PRN for pain control - PT/OT - Surgical wound Cx NGTD Acute blood loss anemia S/p 3u PRBCs. No evidence of active bleed.  - Monitor AM CBCs - Transfusion threshold <8.0 hgb in setting of CAD Diabetes mellitus (HCC) CBG somewhat elevated. - Increase Lantus to 28u daily - Continue moderate SSI  - Holding home metformin and trulicity while admitted Left bundle branch block (LBBB) Appears new from EKG 06/24. QRS duration prolonged, 130. - Will consider repeat troponins/EKG should he develop new symptomology. - Avoid QTc prolonging medications where possible.    Chronic and Stable Problems:  COPD: Clinically silent HTN: BPs within normal limits, CTM PAD: Continue home aspirin, Crestor 40 mg BPH: Continue home finasteride, Flomax, myrbetriq.  FEN/GI: Carb modified diet PPx: S/p bilateral BKA, continuing ASA Dispo:SNF  pending acceptable placement . Barriers include placement.   Subjective:  Patient seen this morning lying in bed comfortably.  He is without complaint or concern.  He is looking forward to hopefully being discharged soon.  Objective: Temp:  [97.7 F (36.5  C)-98.7 F (37.1 C)] 98.7 F (37.1 C) (03/03 0857) Pulse Rate:  [69-80] 72 (03/03 0857) Resp:  [17-18] 18 (03/03 0857) BP: (115-125)/(62-64) 115/63 (03/03 0857) SpO2:  [98 %-100 %] 100 % (03/03 0857) Physical Exam: General: Well-appearing, comfortable, NAD Cardiovascular: RRR, no murmurs Respiratory: CTA bilaterally, normal work of breathing on room air. Abdomen: Soft, nontender Extremities: Bilateral BKA.  Left leg wrapped and wound VAC in place with good output.  Laboratory: Most recent CBC Lab Results  Component Value Date   WBC 3.4 (L) 09/01/2023   HGB 8.2 (L) 09/01/2023   HCT 26.3 (L) 09/01/2023   MCV 88.3 09/01/2023   PLT 125 (L) 09/01/2023   Most recent BMP    Latest Ref Rng & Units 09/01/2023    3:36 AM  BMP  Glucose 70 - 99 mg/dL 433   BUN 8 - 23 mg/dL 22   Creatinine 2.95 - 1.24 mg/dL 1.88   Sodium 416 - 606 mmol/L 135   Potassium 3.5 - 5.1 mmol/L 3.6   Chloride 98 - 111 mmol/L 101   CO2 22 - 32 mmol/L 25   Calcium 8.9 - 10.3 mg/dL 8.6    Magnesium stable at 1.7   Cyndia Skeeters, DO 09/01/2023, 2:08 PM  PGY-1, [facility] FPTS Intern pager: [phone], text pages welcome Secure chat group [facility] Teaching Service

## 2023-09-02 ENCOUNTER — Encounter: Payer: Self-pay | Admitting: Hematology

## 2023-09-02 DIAGNOSIS — K5900 Constipation, unspecified: Secondary | ICD-10-CM | POA: Diagnosis present

## 2023-09-02 DIAGNOSIS — R2689 Other abnormalities of gait and mobility: Secondary | ICD-10-CM | POA: Diagnosis not present

## 2023-09-02 DIAGNOSIS — J449 Chronic obstructive pulmonary disease, unspecified: Secondary | ICD-10-CM | POA: Diagnosis not present

## 2023-09-02 DIAGNOSIS — E1159 Type 2 diabetes mellitus with other circulatory complications: Secondary | ICD-10-CM | POA: Diagnosis not present

## 2023-09-02 DIAGNOSIS — E782 Mixed hyperlipidemia: Secondary | ICD-10-CM | POA: Diagnosis not present

## 2023-09-02 DIAGNOSIS — N189 Chronic kidney disease, unspecified: Secondary | ICD-10-CM | POA: Diagnosis not present

## 2023-09-02 DIAGNOSIS — T8744 Infection of amputation stump, left lower extremity: Secondary | ICD-10-CM | POA: Diagnosis not present

## 2023-09-02 DIAGNOSIS — D62 Acute posthemorrhagic anemia: Secondary | ICD-10-CM | POA: Diagnosis not present

## 2023-09-02 DIAGNOSIS — I130 Hypertensive heart and chronic kidney disease with heart failure and stage 1 through stage 4 chronic kidney disease, or unspecified chronic kidney disease: Secondary | ICD-10-CM | POA: Diagnosis not present

## 2023-09-02 DIAGNOSIS — R739 Hyperglycemia, unspecified: Secondary | ICD-10-CM | POA: Diagnosis not present

## 2023-09-02 DIAGNOSIS — Z7982 Long term (current) use of aspirin: Secondary | ICD-10-CM | POA: Diagnosis not present

## 2023-09-02 DIAGNOSIS — Z743 Need for continuous supervision: Secondary | ICD-10-CM | POA: Diagnosis not present

## 2023-09-02 DIAGNOSIS — T8781 Dehiscence of amputation stump: Secondary | ICD-10-CM | POA: Diagnosis not present

## 2023-09-02 DIAGNOSIS — M6281 Muscle weakness (generalized): Secondary | ICD-10-CM | POA: Diagnosis not present

## 2023-09-02 DIAGNOSIS — R339 Retention of urine, unspecified: Secondary | ICD-10-CM | POA: Diagnosis not present

## 2023-09-02 DIAGNOSIS — E785 Hyperlipidemia, unspecified: Secondary | ICD-10-CM | POA: Diagnosis not present

## 2023-09-02 DIAGNOSIS — E119 Type 2 diabetes mellitus without complications: Secondary | ICD-10-CM | POA: Diagnosis not present

## 2023-09-02 DIAGNOSIS — Z7401 Bed confinement status: Secondary | ICD-10-CM | POA: Diagnosis not present

## 2023-09-02 DIAGNOSIS — Z89512 Acquired absence of left leg below knee: Secondary | ICD-10-CM | POA: Diagnosis not present

## 2023-09-02 DIAGNOSIS — E1169 Type 2 diabetes mellitus with other specified complication: Secondary | ICD-10-CM | POA: Diagnosis not present

## 2023-09-02 DIAGNOSIS — I509 Heart failure, unspecified: Secondary | ICD-10-CM | POA: Diagnosis not present

## 2023-09-02 DIAGNOSIS — K219 Gastro-esophageal reflux disease without esophagitis: Secondary | ICD-10-CM | POA: Diagnosis not present

## 2023-09-02 DIAGNOSIS — E113591 Type 2 diabetes mellitus with proliferative diabetic retinopathy without macular edema, right eye: Secondary | ICD-10-CM | POA: Diagnosis not present

## 2023-09-02 DIAGNOSIS — Z7984 Long term (current) use of oral hypoglycemic drugs: Secondary | ICD-10-CM | POA: Diagnosis not present

## 2023-09-02 DIAGNOSIS — I152 Hypertension secondary to endocrine disorders: Secondary | ICD-10-CM | POA: Diagnosis not present

## 2023-09-02 DIAGNOSIS — Z91040 Latex allergy status: Secondary | ICD-10-CM | POA: Diagnosis not present

## 2023-09-02 DIAGNOSIS — Z4789 Encounter for other orthopedic aftercare: Secondary | ICD-10-CM | POA: Diagnosis not present

## 2023-09-02 DIAGNOSIS — N3281 Overactive bladder: Secondary | ICD-10-CM | POA: Diagnosis not present

## 2023-09-02 DIAGNOSIS — R112 Nausea with vomiting, unspecified: Secondary | ICD-10-CM | POA: Diagnosis not present

## 2023-09-02 DIAGNOSIS — Z89519 Acquired absence of unspecified leg below knee: Secondary | ICD-10-CM | POA: Diagnosis not present

## 2023-09-02 DIAGNOSIS — N139 Obstructive and reflux uropathy, unspecified: Secondary | ICD-10-CM | POA: Diagnosis not present

## 2023-09-02 DIAGNOSIS — E11319 Type 2 diabetes mellitus with unspecified diabetic retinopathy without macular edema: Secondary | ICD-10-CM | POA: Diagnosis not present

## 2023-09-02 DIAGNOSIS — I447 Left bundle-branch block, unspecified: Secondary | ICD-10-CM | POA: Diagnosis not present

## 2023-09-02 DIAGNOSIS — E1122 Type 2 diabetes mellitus with diabetic chronic kidney disease: Secondary | ICD-10-CM | POA: Diagnosis not present

## 2023-09-02 DIAGNOSIS — Z794 Long term (current) use of insulin: Secondary | ICD-10-CM | POA: Diagnosis not present

## 2023-09-02 DIAGNOSIS — I2510 Atherosclerotic heart disease of native coronary artery without angina pectoris: Secondary | ICD-10-CM | POA: Diagnosis not present

## 2023-09-02 DIAGNOSIS — R531 Weakness: Secondary | ICD-10-CM | POA: Diagnosis not present

## 2023-09-02 DIAGNOSIS — S88112D Complete traumatic amputation at level between knee and ankle, left lower leg, subsequent encounter: Secondary | ICD-10-CM | POA: Diagnosis not present

## 2023-09-02 LAB — GLUCOSE, CAPILLARY
Glucose-Capillary: 134 mg/dL — ABNORMAL HIGH (ref 70–99)
Glucose-Capillary: 237 mg/dL — ABNORMAL HIGH (ref 70–99)
Glucose-Capillary: 174 mg/dL — ABNORMAL HIGH (ref 70–99)

## 2023-09-02 LAB — BASIC METABOLIC PANEL
Anion gap: 7 (ref 5–15)
BUN: 20 mg/dL (ref 8–23)
CO2: 26 mmol/L (ref 22–32)
Calcium: 8.4 mg/dL — ABNORMAL LOW (ref 8.9–10.3)
Chloride: 104 mmol/L (ref 98–111)
Creatinine, Ser: 0.8 mg/dL (ref 0.61–1.24)
GFR, Estimated: >60 mL/min (ref >60)
Glucose, Bld: 144 mg/dL — ABNORMAL HIGH (ref 70–99)
Potassium: 3.7 mmol/L (ref 3.5–5.1)
Sodium: 137 mmol/L (ref 135–145)

## 2023-09-02 LAB — CBC
HCT: 26.1 % — ABNORMAL LOW (ref 39.0–52.0)
Hemoglobin: 8.2 g/dL — ABNORMAL LOW (ref 13.0–17.0)
MCH: 27.2 pg (ref 26.0–34.0)
MCHC: 31.4 g/dL (ref 30.0–36.0)
MCV: 86.7 fL (ref 80.0–100.0)
Platelets: 129 10*3/uL — ABNORMAL LOW (ref 150–400)
RBC: 3.01 MIL/uL — ABNORMAL LOW (ref 4.22–5.81)
RDW: 16.8 % — ABNORMAL HIGH (ref 11.5–15.5)
WBC: 3.9 10*3/uL — ABNORMAL LOW (ref 4.0–10.5)
nRBC: 0 % (ref 0.0–0.2)

## 2023-09-02 LAB — MAGNESIUM: Magnesium: 1.8 mg/dL (ref 1.7–2.4)

## 2023-09-02 MED ORDER — LANTUS SOLOSTAR 100 UNIT/ML ~~LOC~~ SOPN
32.0000 [IU] | PEN_INJECTOR | Freq: Every day | SUBCUTANEOUS | Status: DC
Start: 2023-09-02 — End: 2023-12-22

## 2023-09-02 MED ORDER — INSULIN GLARGINE 100 UNIT/ML ~~LOC~~ SOLN
32.0000 [IU] | Freq: Every day | SUBCUTANEOUS | Status: DC
  Filled 2023-09-02: qty 0.32

## 2023-09-02 MED ORDER — TRAMADOL HCL 50 MG PO TABS: 50.0000 mg | ORAL_TABLET | Freq: Four times a day (QID) | ORAL | 0 refills | Status: DC | PRN

## 2023-09-02 NOTE — Assessment & Plan Note (Addendum)
 Appears new from EKG 06/24. QRS duration prolonged, 130.  - Will consider repeat troponins/EKG should he develop new symptomology - Avoid QTc prolonging medications where possible

## 2023-09-02 NOTE — Progress Notes (Addendum)
   09/02/23 1500  Spiritual Encounters  Type of Visit Initial   Chaplain responded to the consult upon pt's daughter request for HCPOA. The daughter preferred to be present during the education of the document. Chaplain called her on last Thursday 08/28/23 and called her again yesterday, 09/01/23, to know when she will be here. First, she said that she will be here on Monday and then she declared that she will be here on Tuesday.  If the daughter will be present, Chaplain will be available from Monday to Friday 8.30-5 pm.   [employer] Resident [phone]

## 2023-09-02 NOTE — Progress Notes (Signed)
 Adam Ray to be D/C'd Skilled nursing facility per MD order.  Discussed with the patient and all questions fully answered.  VSS, Skin clean, dry and intact without evidence of skin break down, no evidence of skin tears noted. IV catheter discontinued intact. Site without signs and symptoms of complications. Dressing and pressure applied.  An After Visit Summary was printed and given to the [facility] for [facility]. Patient prescription signed and sent for facility.  D/c education completed with patient/family including follow up instructions, medication list, d/c activities limitations if indicated, with other d/c instructions as indicated by MD - patient able to verbalize understanding, all questions fully answered.   Attempted to call report multiple times to [facility].   Patient escorted via stretcher, and D/C to SNF via non emergency ambulance.  Adam Ray L Adam Ray 09/02/2023 7:00 PM

## 2023-09-02 NOTE — TOC Progression Note (Addendum)
 Transition of Care [facility]) - Progression Note    Patient Details  Name: Adam Ray MRN: [id number] Date of Birth: 06-08-1956  Transition of Care [facility]) CM/SW Contact  Delilah Shan, LCSWA Phone Number: 09/02/2023, 11:00 AM  Clinical Narrative:     CSW followed up with patient on SNF choice. Patient accepted SNF bed offer with [facility] Patient informed CSW he would like to use his VA benefits. CSW spoke with the [facility] who informed CSW to fax over [facility] screening checklist. CSW faxed over [facility] screening checklist for review. CSW will continue to follow.  Update- CSW received call back from [place] patient coordinator Vernona Rieger who informed CSW that patient is not service connected . That we can send screening check list but that would be for a [facility] bed, If patient wants to go to community Rehab would need to use his UHC benefits. CSW informed patient. Patient agreeable to use his UHC benefit for rehab. Patient accepted SNF bed offer with [facility]. CSW informed Bjorn Loser with [facility] . [facility] confirmed SNF bed for patient. Insurance authorization started [employer] ID [id number]. Insurance authorization currently pending  [employer] authorization has been approved. Auth ID# [id number] 3/4-3/6. MD informed CSW no cpap needed at facility. CSW informed Bjorn Loser with [facility]. Rhonda informed CSW patient can dc over today if medically stable. CSW informed MD.    Barriers to Discharge: Continued Medical Work up, English as a second language teacher, SNF Pending bed offer  Expected Discharge Plan and Services In-house Referral: Clinical Social Work   Post Acute Care Choice: IP Rehab Living arrangements for the past 2 months: Single Family Home (and CIR)                                       Social Determinants of Health (SDOH) Interventions SDOH Screenings   Food Insecurity: No Food Insecurity (08/25/2023)  Housing: Low Risk  (08/25/2023)  Transportation Needs: Unmet Transportation  Needs (08/25/2023)  Utilities: Not At Risk (08/25/2023)  Alcohol Screen: Low Risk  (08/23/2022)  Depression (PHQ2-9): Low Risk  (05/05/2023)  Financial Resource Strain: Medium Risk (08/23/2022)  Physical Activity: Insufficiently Active (08/23/2022)  Social Connections: Moderately Integrated (08/25/2023)  Stress: No Stress Concern Present (08/23/2022)  Tobacco Use: Medium Risk (08/27/2023)    Readmission Risk Interventions    08/26/2023    1:34 PM  Readmission Risk Prevention Plan  Transportation Screening Complete  PCP or Specialist Appt within 5-7 Days Complete  Home Care Screening Complete  Medication Review (RN CM) Referral to Pharmacy

## 2023-09-02 NOTE — Assessment & Plan Note (Signed)
 S/p 3u PRBCs over the course of hospitalization. No evidence of active bleed.  Hemoglobin continues to be stable at 8.2 this morning. - Monitor AM CBCs - Transfusion threshold <8.0 hgb in setting of CAD

## 2023-09-02 NOTE — TOC Transition Note (Signed)
 Transition of Care [facility]) - Discharge Note   Patient Details  Name: Adam Ray MRN: [id number] Date of Birth: 1956-04-16  Transition of Care [facility]) CM/SW Contact:  Delilah Shan, LCSWA Phone Number: 09/02/2023, 3:11 PM   Clinical Narrative:     Patient will DC to: [facility]F   Anticipated DC date: 09/02/2023  Family notified: Marylene Land   Transport by: Sharin Mons  ?  Per MD patient ready for DC to [facility] SNF . RN, patient, patient's family, and facility notified of DC. Discharge Summary sent to facility. RN given number for report [phone] Ext:[phone] RM# [phone]. DC packet on chart. Ambulance transport requested for patient.  CSW signing off.   Final next level of care: Skilled Nursing Facility Barriers to Discharge: No Barriers Identified   Patient Goals and CMS Choice Patient states their goals for this hospitalization and ongoing recovery are:: SNF CMS Medicare.gov Compare Post Acute Care list provided to:: Patient Choice offered to / list presented to : Patient [place] ownership interest in [facility]provided to:: Adult Children    Discharge Placement              Patient chooses bed at: [facility] Patient to be transferred to facility by: [facility] Name of family member notified: Marylene Land Patient and family notified of of transfer: 09/02/23  Discharge Plan and Services Additional resources added to the After Visit Summary for   In-house Referral: Clinical Social Work   Post Acute Care Choice: IP Rehab                               Social Drivers of Health (SDOH) Interventions SDOH Screenings   Food Insecurity: No Food Insecurity (08/25/2023)  Housing: Low Risk  (08/25/2023)  Transportation Needs: Unmet Transportation Needs (08/25/2023)  Utilities: Not At Risk (08/25/2023)  Alcohol Screen: Low Risk  (08/23/2022)  Depression (PHQ2-9): Low Risk  (05/05/2023)  Financial Resource Strain: Medium Risk (08/23/2022)  Physical Activity:  Insufficiently Active (08/23/2022)  Social Connections: Moderately Integrated (08/25/2023)  Stress: No Stress Concern Present (08/23/2022)  Tobacco Use: Medium Risk (08/27/2023)     Readmission Risk Interventions    08/26/2023    1:34 PM  Readmission Risk Prevention Plan  Transportation Screening Complete  PCP or Specialist Appt within 5-7 Days Complete  Home Care Screening Complete  Medication Review (RN CM) Referral to Pharmacy

## 2023-09-02 NOTE — Progress Notes (Signed)
 Daily Progress Note Intern Pager: [phone]  Patient name: Adam Ray Medical record number: [id number] Date of birth: 1955/10/13 Age: 67 y.o. Gender: male  Primary Care Provider: Latrelle Dodrill, MD Consultants: Orthopedic surgery Code Status: Full  Pt Overview and Major Events to Date:  2/24: Admitted, s/p 1U PRBCs 2/25: S/p 1U PRBC 2/26: Underwent flap revision 2/27: S/p 1U PRBCs  Assessment and Plan: Adam Ray is a 68 y.o. male who is POD#6 of flap revision due to falling at home with entrapment of recent left BKA.  Patient is well-appearing and comfortable today.  Continue to await appropriate SNF discharge.  Assessment & Plan Dehiscence of amputation stump of left lower extremity (HCC) POD#6. Pain well controlled on current regimen. - Ortho following, appreciate recs.  - Scheduled Tylenol 650 mg, Tramadol PRN for pain control - PT/OT - Surgical wound Cx NGTD Acute blood loss anemia S/p 3u PRBCs over the course of hospitalization. No evidence of active bleed.  Hemoglobin continues to be stable at 8.2 this morning. - Monitor AM CBCs - Transfusion threshold <8.0 hgb in setting of CAD Diabetes mellitus (HCC) CBGs have been somewhat elevated.  134 this a.m. after increasing Lantus yesterday. - Increase Lantus to 32 units daily. - Continue moderate SSI  -Monitor CBGs - Holding home metformin and trulicity while admitted Left bundle branch block (LBBB) Appears new from EKG 06/24. QRS duration prolonged, 130. - Will consider repeat troponins/EKG should he develop new symptomology. - Avoid QTc prolonging medications where possible.   Chronic and Stable Problems:  COPD: Clinically silent HTN: BPs within normal limits, CTM PAD: Continue home aspirin, Crestor 40 mg BPH: Continue home finasteride, Flomax, myrbetriq.  FEN/GI: Carb modified diet PPx: S/p bilateral BKA, continuing ASA Dispo:SNF  pending placement . Barriers include placement.   Subjective:   Patient seen this morning lying in bed.  He reports pain well-controlled with scheduled Tylenol.  Denies constipation, though he did ask for MiraLAX yesterday.  Refused CPAP overnight.  He is very anxious to find SNF placement so he can leave the hospital.  Objective: Temp:  [98 F (36.7 C)-98.6 F (37 C)] 98 F (36.7 C) (03/04 0800) Pulse Rate:  [74-84] 84 (03/04 0800) Resp:  [18-20] 20 (03/04 0800) BP: (105-119)/(62-71) 109/71 (03/04 0800) SpO2:  [99 %-100 %] 100 % (03/04 0500) Physical Exam: General: Well-appearing, lying in bed comfortably, NAD Cardiovascular: RRR Respiratory: CTA bilaterally, normal work of breathing on room air Abdomen: Soft, nontender, nondistended Extremities: Bilateral BKA.  Left leg wrapped, wound VAC not in place.  Laboratory: Most recent CBC Lab Results  Component Value Date   WBC 3.9 (L) 09/02/2023   HGB 8.2 (L) 09/02/2023   HCT 26.1 (L) 09/02/2023   MCV 86.7 09/02/2023   PLT 129 (L) 09/02/2023   Most recent BMP    Latest Ref Rng & Units 09/02/2023    3:44 AM  BMP  Glucose 70 - 99 mg/dL 308   BUN 8 - 23 mg/dL 20   Creatinine 6.57 - 1.24 mg/dL 8.46   Sodium 962 - 952 mmol/L 137   Potassium 3.5 - 5.1 mmol/L 3.7   Chloride 98 - 111 mmol/L 104   CO2 22 - 32 mmol/L 26   Calcium 8.9 - 10.3 mg/dL 8.4    Magnesium 1.8  Cyndia Skeeters, DO 09/02/2023, 1:59 PM  PGY-1, [facility] Health Family Medicine FPTS Intern pager: [phone], text pages welcome Secure chat group [facility] Teaching Service

## 2023-09-02 NOTE — Plan of Care (Signed)
  Problem: Education: Goal: Ability to describe self-care measures that may prevent or decrease complications (Diabetes Survival Skills Education) will improve Outcome: Progressing Goal: Individualized Educational Video(s) Outcome: Progressing   Problem: Coping: Goal: Ability to adjust to condition or change in health will improve Outcome: Progressing   Problem: Fluid Volume: Goal: Ability to maintain a balanced intake and output will improve Outcome: Progressing   Problem: Health Behavior/Discharge Planning: Goal: Ability to identify and utilize available resources and services will improve Outcome: Progressing Goal: Ability to manage health-related needs will improve Outcome: Progressing   Problem: Metabolic: Goal: Ability to maintain appropriate glucose levels will improve Outcome: Progressing   Problem: Nutritional: Goal: Maintenance of adequate nutrition will improve Outcome: Progressing Goal: Progress toward achieving an optimal weight will improve Outcome: Progressing   Problem: Skin Integrity: Goal: Risk for impaired skin integrity will decrease Outcome: Progressing   Problem: Tissue Perfusion: Goal: Adequacy of tissue perfusion will improve Outcome: Progressing   Problem: Education: Goal: Knowledge of General Education information will improve Description: Including pain rating scale, medication(s)/side effects and non-pharmacologic comfort measures Outcome: Progressing   Problem: Health Behavior/Discharge Planning: Goal: Ability to manage health-related needs will improve Outcome: Progressing   Problem: Clinical Measurements: Goal: Ability to maintain clinical measurements within normal limits will improve Outcome: Progressing Goal: Will remain free from infection Outcome: Progressing Goal: Diagnostic test results will improve Outcome: Progressing Goal: Respiratory complications will improve Outcome: Progressing Goal: Cardiovascular complication will  be avoided Outcome: Progressing   Problem: Activity: Goal: Risk for activity intolerance will decrease Outcome: Progressing   Problem: Nutrition: Goal: Adequate nutrition will be maintained Outcome: Progressing   Problem: Coping: Goal: Level of anxiety will decrease Outcome: Progressing   Problem: Elimination: Goal: Will not experience complications related to bowel motility Outcome: Progressing Goal: Will not experience complications related to urinary retention Outcome: Progressing   Problem: Pain Managment: Goal: General experience of comfort will improve and/or be controlled Outcome: Progressing   Problem: Safety: Goal: Ability to remain free from injury will improve Outcome: Progressing   Problem: Skin Integrity: Goal: Risk for impaired skin integrity will decrease Outcome: Progressing   Problem: Education: Goal: Knowledge of the prescribed therapeutic regimen will improve Outcome: Progressing Goal: Ability to verbalize activity precautions or restrictions will improve Outcome: Progressing Goal: Understanding of discharge needs will improve Outcome: Progressing   Problem: Activity: Goal: Ability to perform//tolerate increased activity and mobilize with assistive devices will improve Outcome: Progressing   Problem: Clinical Measurements: Goal: Postoperative complications will be avoided or minimized Outcome: Progressing   Problem: Self-Care: Goal: Ability to meet self-care needs will improve Outcome: Progressing   Problem: Self-Concept: Goal: Ability to maintain and perform role responsibilities to the fullest extent possible will improve Outcome: Progressing   Problem: Pain Management: Goal: Pain level will decrease with appropriate interventions Outcome: Progressing   Problem: Skin Integrity: Goal: Demonstration of wound healing without infection will improve Outcome: Progressing

## 2023-09-02 NOTE — Assessment & Plan Note (Addendum)
 CBGs have been somewhat elevated.  134 this a.m. after increasing Lantus yesterday. - Increase Lantus to 32 units daily. - Continue moderate SSI  -Monitor CBGs - Holding home metformin and trulicity while admitted

## 2023-09-02 NOTE — Discharge Summary (Signed)
 Family Medicine Teaching [facility] Discharge Summary  Patient name: Adam Ray Medical record number: [id number] Date of birth: 09/25/1955 Age: 68 y.o. Gender: male Date of Admission: 08/25/2023  Date of Discharge: 09/02/23 Admitting Physician: Tomie China, MD  Primary Care Provider: Latrelle Dodrill, MD Consultants: Orthopedic surgery  Indication for Hospitalization: Dehiscence of amputation site  Brief Hospital Course:  Adam Ray is a 68 y.o. male who was admitted to the [facility] Medicine Teaching Service at [facility] for open wound on left BKA stump status post a fall.  Past medical history was significant for bilateral BKA's in the setting of PAD, COPD, OSA, T2DM, hypertension, CAD, bicuspid aortic valve, sickle cell trait.  Hospital course is outlined below by problem.   Left BKA stump wound s/p flap revision  Presented after a fall with getting tangled up in prosthetics. 300-400 mL of blood loss, as well as a foul smell of the stump, noticed by EMS after they were called.  Vitals overall stable though patient appeared pale and diaphoretic.  He endorsed pain.  X-rays of the left tib-fib demonstrated soft tissue edema without fracture.  Compression dressing was placed he was given home tramadol for pain control. Ortho surgery consulted who took him to the OR on 2/26 for flap repair.  Flap revision was successful, and patient recovered appropriately.  Recommendation was for patient to go to SNF for short-term rehab once he was stable for transfer.  Acute blood loss on chronic anemia  Per EMS, pt lost 300-444mL of blood prior to arrival from stump. Hgb was below threshold at 7.2 on admission and he was subsequently given [facility] with improvement.  Required a third unit of PRBCs following flap revision.  His hgb was monitored throughout the rest of his admission and stayed above threshold following transfusions.   Type II Diabetes  Pt presented with elevated sugars to low 200s.  He was started on SSI.  He had a brief spike in sugars which required the addition of 15 units of Lantus which brought him back into the low mid 200s.  He was stable for several days sugar spiked again into the mid 200s; long-acting insulin was increased to 28 units daily, then increased to 32 units.  At time of discharge he was receiving 32 units daily.  LBBB:  New this admission from EKG on 06/24. QRS was prolonged to 130 and QTC to 513. No chest pain, shortness of breath, dizziness, or other s/sx to suggest ACS.   Hypomagnesemia (resolved)  Pt presented with Mg to 1.3 on admission and was repleted appropriately. Mg was monitored and remained normal throughout admission.   Other conditions that were chronic and stable:  COPD: Clinically silent. No SOB. HTN: BP remained normal throughout admission.  PAD: Continued on home aspirin, Crestor  Hx of osteomyelitis: X-ray negative for obvious infectious process.   BPH: Continued on home finasteride.  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  Left leg wrapped, wound VAC not in place.  Significant Procedures:  08/27/2023-revision of left BKA  Significant Labs and Imaging:  Recent Labs  Lab 09/01/23 0336 09/02/23 0344  WBC 3.4* 3.9*  HGB 8.2* 8.2*  HCT  26.3* 26.1*  PLT 125* 129*   Recent Labs  Lab 09/01/23 0336 09/02/23 0344  NA 135 137  K 3.6 3.7  CL 101 104  CO2 25 26  GLUCOSE 247* 144*  BUN 22 20  CREATININE 0.83 0.80  CALCIUM 8.6* 8.4*  MG 1.7 1.8   Imaging Studies Performed: XR L Tibia/Fibula (2/24) IMPRESSION: Prior left BKA.  No acute bony abnormality. Swollen/edematous soft tissues at the stump.  No soft tissue gas.    XR Chest (2/24) IMPRESSION: No active disease.  Results/Tests Pending at Time of Discharge: None  Discharge Medications:  Allergies as of 09/02/2023       Reactions   Codeine Other (See Comments)   Heart attack.   Hydrocodone Other (See Comments)   hallucinations   Latex Hives, Itching   Oxycodone Other (See Comments)   hallucinations   Morphine Other (See Comments)   Patient preference   Propofol Other (See Comments)   Patient preference        Medication List     STOP taking these medications    FreeStyle Libre 3 Reader [facility] [place] 3 Sensor Misc       TAKE these medications    Accu-Chek Guide Test test strip Generic drug: glucose blood Use 1 each by In Vitro route in the morning, at noon, and at bedtime. May substitute to any manufacturer covered by patient's insurance.   Accu-Chek Guide w/Device Kit Use in the morning, at noon, and at bedtime.   Accu-Chek Softclix Lancets lancets Use as directed.   acetaminophen 325 MG tablet Commonly known as: Tylenol Take 1-2 tablets (325-650 mg total) by mouth every 6 (six) hours as needed.   ascorbic acid 1000 MG tablet Commonly known as: VITAMIN C Take 1 tablet (1,000 mg total) by mouth daily.   aspirin 81 MG chewable tablet Chew 81 mg by mouth daily.   CINNAMON PO Take 1,200 mg by mouth 2 (two) times daily. [place]   cyanocobalamin 1000 MCG/ML injection Commonly known as: VITAMIN B12 Inject 1 mL (1,000 mcg total) into the muscle every 30 (thirty) days.   cyclobenzaprine 5 MG tablet Commonly known as:  FLEXERIL Take 1 tablet (5 mg total) by mouth 3 (three) times daily as needed for muscle spasms.   famotidine 20 MG tablet Commonly known as: PEPCID TAKE 1 TABLET (20 MG TOTAL) BY MOUTH 2 (TWO) TIMES DAILY AS NEEDED FOR HEARTBURN OR INDIGESTION. What changed: when to take this   finasteride 5 MG tablet Commonly known as: PROSCAR Take 5 mg by mouth daily.   Gemtesa 75 MG Tabs Generic drug: Vibegron Take 75 mg by mouth daily.   Lancet Device Misc 1 each by Does not apply route in the morning, at noon, and at bedtime. May substitute to any manufacturer covered by patient's insurance.   Lancets Misc. Misc 1 each by Does not apply route in the morning, at noon, and at bedtime. May substitute to any manufacturer covered by patient's insurance.   Lantus SoloStar 100 UNIT/ML Solostar Pen Generic drug: insulin glargine Inject 32 Units into the skin daily. What changed: how much to take  metFORMIN 850 MG tablet Commonly known as: GLUCOPHAGE Take 1 tablet (850 mg total) by mouth 2 (two) times daily with a meal.   multivitamin with minerals Tabs tablet Take 1 tablet by mouth daily.  iron   rosuvastatin 40 MG tablet Commonly known as: CRESTOR Take 1 tablet (40 mg total) by mouth daily at 6 PM. What changed:  how much to take when to take this   tamsulosin 0.4 MG Caps capsule Commonly known as: FLOMAX Take 1 capsule (0.4 mg total) by mouth daily.   tolterodine 4 MG 24 hr capsule Commonly known as: DETROL LA Take 4 mg by mouth daily.   traMADol 50 MG tablet Commonly known as: ULTRAM Take 1-2 tablets (50-100 mg total) by mouth every 6 (six) hours as needed for severe pain (pain score 7-10).   Trulicity 0.75 MG/0.5ML Soaj Generic drug: Dulaglutide Inject 0.75 mg into the skin every 30 (thirty) days.   vitamin D3 25 MCG tablet Commonly known as: CHOLECALCIFEROL Take 1 tablet (1,000 Units total) by mouth daily.        Discharge Instructions: Please refer to Patient  Instructions section of EMR for full details.  Patient was counseled important signs and symptoms that should prompt return to medical care, changes in medications, dietary instructions, activity restrictions, and follow up appointments.   Follow-Up Appointments:  Follow-up Information     [facility] Follow up.   Specialty: Home Health Services Why: Someone will call you to schedule resumption of care visit. Contact information: [address] [place] [address] [phone]         [name], MD Follow up in 1 week(s).   Specialty: Orthopedic Surgery Contact information: [address] [place] [address] [phone]                 Cyndia Skeeters, DO 09/02/2023, 3:13 PM PGY-1, [facility] Health Family Medicine

## 2023-09-02 NOTE — Assessment & Plan Note (Addendum)
 POD#6. Pain well controlled on current regimen. - Ortho following, appreciate recs.  - Scheduled Tylenol 650 mg, Tramadol PRN for pain control - PT/OT - Surgical wound Cx NGTD

## 2023-09-02 NOTE — Progress Notes (Signed)
 Physical Therapy Treatment  Patient Details Name: Adam Ray MRN: [id number] DOB: 1955/10/05 Today's Date: 09/02/2023   History of Present Illness Pt is a 68 y/o M admitted on 08/25/23 for another fall on L residual limb with wound dehiscence. 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 PMH: COPD, DM, HTN, PAD, anxiety, osteomyelitis, s/p B BKAs    PT Comments  Pt progressing towards physical therapy goals. Pt able to get R prosthetic donned easier this date, however continues to perseverate on getting a shrinker for the R residual limb. Wife present and PT asked if wife could bring his shrinker from home for the R however both pt and wife unsatisfied with this, wanting a new one. RN notified. Pt was able to stand and demonstrate step-pivot transfers bed<>chair with +2 mod assist and RW for support. Pt declined staying in the recliner this date. Will continue to follow and progress as able per POC.    If plan is discharge home, recommend the following: A lot of help with walking and/or transfers;A lot of help with bathing/dressing/bathroom;Assistance with cooking/housework;Assist for transportation;Help with stairs or ramp for entrance   Can travel by private vehicle     No  Equipment Recommendations  None recommended by PT (defer to next venue)    Recommendations for Other Services Rehab consult     Precautions / Restrictions Precautions Precautions: Fall Precaution/Restrictions Comments: shrinker L; prosthetic for RLE Required Braces or Orthoses: Other Brace Other Brace: LLE Limb Guard Restrictions Weight Bearing Restrictions Per Provider Order: Yes LLE Weight Bearing Per Provider Order: Non weight bearing     Mobility  Bed Mobility Overal bed mobility: Needs Assistance Bed Mobility: Supine to Sit, Sit to Supine     Supine to sit: Supervision Sit to supine: Supervision   General bed mobility comments: Pt able to transition to EOB without assistance. Increased time and use of rails  required. Pt demonstrating good scooting technique to position at EOB.    Transfers Overall transfer level: Needs assistance Equipment used: Rolling walker (2 wheels) Transfers: Bed to chair/wheelchair/BSC, Sit to/from Stand Sit to Stand: Min assist, Mod assist, +2 physical assistance, +2 safety/equipment, From elevated surface   Step pivot transfers: Mod assist, +2 physical assistance, +2 safety/equipment, From elevated surface       General transfer comment: Pt able to get prosthetic donned and +2 assist required for pivot from bed>chair. Pt able to take hopping steps from bed>chair ~4 feet, and then chair was pushed up against bed for SPT back to bed due to fatigue.    Ambulation/Gait                   Stairs             Wheelchair Mobility     Tilt Bed    Modified Rankin (Stroke Patients Only)       Balance Overall balance assessment: Needs assistance Sitting-balance support: Single extremity supported, Feet unsupported Sitting balance-Leahy Scale: Fair   Postural control: Posterior lean Standing balance support: Bilateral upper extremity supported Standing balance-Leahy Scale: Poor                              Communication Communication Communication: No apparent difficulties Factors Affecting Communication:  (sometimes difficult to understand)  Cognition Arousal: Alert Behavior During Therapy: WFL for tasks assessed/performed   PT - Cognitive impairments: Awareness  Following commands: Intact      Cueing Cueing Techniques: Verbal cues  Exercises Amputee Exercises Knee Extension: 10 reps (LAQ)    General Comments        Pertinent Vitals/Pain Pain Assessment Pain Assessment: Faces Faces Pain Scale: Hurts little more Pain Location: LLE surgical site Pain Descriptors / Indicators: Discomfort Pain Intervention(s): Limited activity within patient's tolerance, Monitored during session,  Repositioned    Home Living                          Prior Function            PT Goals (current goals can now be found in the care plan section) Acute Rehab PT Goals Patient Stated Goal: LLE healing PT Goal Formulation: With patient Time For Goal Achievement: 09/11/23 Potential to Achieve Goals: Good Progress towards PT goals: Progressing toward goals    Frequency    Min 1X/week      PT Plan      Co-evaluation              AM-PAC PT "6 Clicks" Mobility   Outcome Measure  Help needed turning from your back to your side while in a flat bed without using bedrails?: None Help needed moving from lying on your back to sitting on the side of a flat bed without using bedrails?: A Little Help needed moving to and from a bed to a chair (including a wheelchair)?: A Little Help needed standing up from a chair using your arms (e.g., wheelchair or bedside chair)?: Total Help needed to walk in hospital room?: Total Help needed climbing 3-5 steps with a railing? : Total 6 Click Score: 13    End of Session Equipment Utilized During Treatment: Gait belt Activity Tolerance: Patient tolerated treatment well Patient left: in chair;with chair alarm set;with call bell/phone within reach Nurse Communication: Mobility status PT Visit Diagnosis: Other abnormalities of gait and mobility (R26.89);Muscle weakness (generalized) (M62.81);Difficulty in walking, not elsewhere classified (R26.2);Pain;History of falling (Z91.81) Pain - Right/Left: Left Pain - part of body: Shoulder     Time: 9604-5409 PT Time Calculation (min) (ACUTE ONLY): 30 min  Charges:    $Gait Training: 23-37 mins PT General Charges $$ ACUTE PT VISIT: 1 Visit                     Conni Slipper, PT, DPT Acute Rehabilitation Services Secure Chat Preferred Office: [phone]    Marylynn Pearson 09/02/2023, 3:11 PM

## 2023-09-03 DIAGNOSIS — E119 Type 2 diabetes mellitus without complications: Secondary | ICD-10-CM | POA: Diagnosis not present

## 2023-09-03 DIAGNOSIS — T8744 Infection of amputation stump, left lower extremity: Secondary | ICD-10-CM | POA: Diagnosis not present

## 2023-09-04 DIAGNOSIS — T8744 Infection of amputation stump, left lower extremity: Secondary | ICD-10-CM | POA: Diagnosis not present

## 2023-09-04 DIAGNOSIS — E1169 Type 2 diabetes mellitus with other specified complication: Secondary | ICD-10-CM | POA: Diagnosis not present

## 2023-09-04 DIAGNOSIS — E113591 Type 2 diabetes mellitus with proliferative diabetic retinopathy without macular edema, right eye: Secondary | ICD-10-CM | POA: Diagnosis not present

## 2023-09-04 DIAGNOSIS — D62 Acute posthemorrhagic anemia: Secondary | ICD-10-CM | POA: Diagnosis not present

## 2023-09-04 DIAGNOSIS — E119 Type 2 diabetes mellitus without complications: Secondary | ICD-10-CM | POA: Diagnosis not present

## 2023-09-05 ENCOUNTER — Telehealth: Payer: Self-pay | Admitting: Orthopedic Surgery

## 2023-09-05 ENCOUNTER — Ambulatory Visit (INDEPENDENT_AMBULATORY_CARE_PROVIDER_SITE_OTHER): Admitting: Family

## 2023-09-05 DIAGNOSIS — T8744 Infection of amputation stump, left lower extremity: Secondary | ICD-10-CM | POA: Diagnosis not present

## 2023-09-05 DIAGNOSIS — E119 Type 2 diabetes mellitus without complications: Secondary | ICD-10-CM | POA: Diagnosis not present

## 2023-09-05 DIAGNOSIS — Z89512 Acquired absence of left leg below knee: Secondary | ICD-10-CM

## 2023-09-05 NOTE — Telephone Encounter (Signed)
 Patient called requesting for a call back please. Lowella Grip [phone]

## 2023-09-05 NOTE — Telephone Encounter (Signed)
 I called and lm on vm to cb with details and I can call back with answers.

## 2023-09-06 DIAGNOSIS — T8744 Infection of amputation stump, left lower extremity: Secondary | ICD-10-CM | POA: Diagnosis not present

## 2023-09-06 DIAGNOSIS — E119 Type 2 diabetes mellitus without complications: Secondary | ICD-10-CM | POA: Diagnosis not present

## 2023-09-08 DIAGNOSIS — E119 Type 2 diabetes mellitus without complications: Secondary | ICD-10-CM | POA: Diagnosis not present

## 2023-09-08 DIAGNOSIS — T8744 Infection of amputation stump, left lower extremity: Secondary | ICD-10-CM | POA: Diagnosis not present

## 2023-09-09 ENCOUNTER — Emergency Department (HOSPITAL_COMMUNITY)

## 2023-09-09 ENCOUNTER — Emergency Department (HOSPITAL_COMMUNITY)
Admission: EM | Admit: 2023-09-09 | Discharge: 2023-09-09 | Disposition: A | Discharge status: Home / Self Care | Attending: Emergency Medicine | Admitting: Emergency Medicine

## 2023-09-09 ENCOUNTER — Encounter: Payer: Self-pay | Admitting: Hematology

## 2023-09-09 ENCOUNTER — Other Ambulatory Visit: Payer: Self-pay

## 2023-09-09 ENCOUNTER — Encounter (HOSPITAL_COMMUNITY): Payer: Self-pay | Admitting: Emergency Medicine

## 2023-09-09 DIAGNOSIS — Z7401 Bed confinement status: Secondary | ICD-10-CM | POA: Diagnosis not present

## 2023-09-09 DIAGNOSIS — E1122 Type 2 diabetes mellitus with diabetic chronic kidney disease: Secondary | ICD-10-CM | POA: Diagnosis not present

## 2023-09-09 DIAGNOSIS — T8744 Infection of amputation stump, left lower extremity: Secondary | ICD-10-CM | POA: Diagnosis not present

## 2023-09-09 DIAGNOSIS — I130 Hypertensive heart and chronic kidney disease with heart failure and stage 1 through stage 4 chronic kidney disease, or unspecified chronic kidney disease: Secondary | ICD-10-CM | POA: Insufficient documentation

## 2023-09-09 DIAGNOSIS — Z7984 Long term (current) use of oral hypoglycemic drugs: Secondary | ICD-10-CM | POA: Diagnosis not present

## 2023-09-09 DIAGNOSIS — Z7982 Long term (current) use of aspirin: Secondary | ICD-10-CM | POA: Insufficient documentation

## 2023-09-09 DIAGNOSIS — Z743 Need for continuous supervision: Secondary | ICD-10-CM | POA: Diagnosis not present

## 2023-09-09 DIAGNOSIS — R339 Retention of urine, unspecified: Secondary | ICD-10-CM | POA: Insufficient documentation

## 2023-09-09 DIAGNOSIS — Z794 Long term (current) use of insulin: Secondary | ICD-10-CM | POA: Insufficient documentation

## 2023-09-09 DIAGNOSIS — J449 Chronic obstructive pulmonary disease, unspecified: Secondary | ICD-10-CM | POA: Insufficient documentation

## 2023-09-09 DIAGNOSIS — Z89512 Acquired absence of left leg below knee: Secondary | ICD-10-CM | POA: Insufficient documentation

## 2023-09-09 DIAGNOSIS — K5900 Constipation, unspecified: Secondary | ICD-10-CM | POA: Diagnosis not present

## 2023-09-09 DIAGNOSIS — R531 Weakness: Secondary | ICD-10-CM | POA: Diagnosis not present

## 2023-09-09 DIAGNOSIS — Z91040 Latex allergy status: Secondary | ICD-10-CM | POA: Diagnosis not present

## 2023-09-09 DIAGNOSIS — N189 Chronic kidney disease, unspecified: Secondary | ICD-10-CM | POA: Insufficient documentation

## 2023-09-09 DIAGNOSIS — R739 Hyperglycemia, unspecified: Secondary | ICD-10-CM | POA: Diagnosis not present

## 2023-09-09 DIAGNOSIS — R112 Nausea with vomiting, unspecified: Secondary | ICD-10-CM | POA: Diagnosis not present

## 2023-09-09 DIAGNOSIS — I2510 Atherosclerotic heart disease of native coronary artery without angina pectoris: Secondary | ICD-10-CM | POA: Diagnosis not present

## 2023-09-09 DIAGNOSIS — I509 Heart failure, unspecified: Secondary | ICD-10-CM | POA: Insufficient documentation

## 2023-09-09 DIAGNOSIS — E119 Type 2 diabetes mellitus without complications: Secondary | ICD-10-CM | POA: Diagnosis not present

## 2023-09-09 LAB — CBC WITH DIFFERENTIAL/PLATELET
Abs Immature Granulocytes: 0.01 10*3/uL (ref 0.00–0.07)
Basophils Absolute: 0 10*3/uL (ref 0.0–0.1)
Basophils Relative: 1 %
Eosinophils Absolute: 0.1 10*3/uL (ref 0.0–0.5)
Eosinophils Relative: 2 %
HCT: 33 % — ABNORMAL LOW (ref 39.0–52.0)
Hemoglobin: 10 g/dL — ABNORMAL LOW (ref 13.0–17.0)
Immature Granulocytes: 0 %
Lymphocytes Relative: 21 %
Lymphs Abs: 1 10*3/uL (ref 0.7–4.0)
MCH: 26.8 pg (ref 26.0–34.0)
MCHC: 30.3 g/dL (ref 30.0–36.0)
MCV: 88.5 fL (ref 80.0–100.0)
Monocytes Absolute: 0.5 10*3/uL (ref 0.1–1.0)
Monocytes Relative: 12 %
Neutro Abs: 2.9 10*3/uL (ref 1.7–7.7)
Neutrophils Relative %: 64 %
Platelets: 194 10*3/uL (ref 150–400)
RBC: 3.73 MIL/uL — ABNORMAL LOW (ref 4.22–5.81)
RDW: 16.2 % — ABNORMAL HIGH (ref 11.5–15.5)
WBC: 4.5 10*3/uL (ref 4.0–10.5)
nRBC: 0 % (ref 0.0–0.2)

## 2023-09-09 LAB — URINALYSIS, W/ REFLEX TO CULTURE (INFECTION SUSPECTED)
Bacteria, UA: NONE SEEN
Bilirubin Urine: NEGATIVE
Glucose, UA: 50 mg/dL — AB
Hgb urine dipstick: NEGATIVE
Ketones, ur: NEGATIVE mg/dL
Leukocytes,Ua: NEGATIVE
Nitrite: NEGATIVE
Protein, ur: NEGATIVE mg/dL
Specific Gravity, Urine: 1.02 (ref 1.005–1.030)
pH: 6 (ref 5.0–8.0)

## 2023-09-09 LAB — COMPREHENSIVE METABOLIC PANEL
ALT: 13 U/L (ref 0–44)
AST: 16 U/L (ref 15–41)
Albumin: 3.4 g/dL — ABNORMAL LOW (ref 3.5–5.0)
Alkaline Phosphatase: 110 U/L (ref 38–126)
Anion gap: 7 (ref 5–15)
BUN: 15 mg/dL (ref 8–23)
CO2: 29 mmol/L (ref 22–32)
Calcium: 8.9 mg/dL (ref 8.9–10.3)
Chloride: 99 mmol/L (ref 98–111)
Creatinine, Ser: 0.73 mg/dL (ref 0.61–1.24)
GFR, Estimated: >60 mL/min (ref >60)
Glucose, Bld: 181 mg/dL — ABNORMAL HIGH (ref 70–99)
Potassium: 3.7 mmol/L (ref 3.5–5.1)
Sodium: 135 mmol/L (ref 135–145)
Total Bilirubin: 0.8 mg/dL (ref 0.0–1.2)
Total Protein: 6.8 g/dL (ref 6.5–8.1)

## 2023-09-09 LAB — MAGNESIUM: Magnesium: 1.7 mg/dL (ref 1.7–2.4)

## 2023-09-09 LAB — LIPASE, BLOOD: Lipase: 26 U/L (ref 11–51)

## 2023-09-09 LAB — TROPONIN I (HIGH SENSITIVITY): Troponin I (High Sensitivity): 5 ng/L (ref <18)

## 2023-09-09 MED ORDER — ALUM & MAG HYDROXIDE-SIMETH 200-200-20 MG/5ML PO SUSP
30.0000 mL | Freq: Once | ORAL | Status: AC
  Administered 2023-09-09: 30 mL via ORAL
  Filled 2023-09-09: qty 30

## 2023-09-09 MED ORDER — POLYETHYLENE GLYCOL 3350 17 G PO PACK
17.0000 g | PACK | Freq: Every day | ORAL | 0 refills | Status: DC
Start: 2023-09-09 — End: 2023-12-22

## 2023-09-09 MED ORDER — ONDANSETRON HCL 4 MG/2ML IJ SOLN
4.0000 mg | Freq: Four times a day (QID) | INTRAMUSCULAR | Status: DC | PRN
  Administered 2023-09-09: 4 mg via INTRAVENOUS
  Filled 2023-09-09: qty 2

## 2023-09-09 MED ORDER — LACTATED RINGERS IV BOLUS
500.0000 mL | Freq: Once | INTRAVENOUS | Status: AC
  Administered 2023-09-09: 500 mL via INTRAVENOUS

## 2023-09-09 MED ORDER — DOCUSATE SODIUM 100 MG PO CAPS: 100.0000 mg | ORAL_CAPSULE | Freq: Two times a day (BID) | ORAL | 0 refills | Status: DC

## 2023-09-09 MED ORDER — LIDOCAINE VISCOUS HCL 2 % MT SOLN
15.0000 mL | Freq: Once | OROMUCOSAL | Status: AC
  Administered 2023-09-09: 15 mL via ORAL
  Filled 2023-09-09: qty 15

## 2023-09-09 NOTE — ED Provider Notes (Addendum)
 [place] EMERGENCY DEPARTMENT AT [facility] Provider Note   CSN: [id number] Arrival date & time: 09/09/23  1411     History  Chief Complaint  Patient presents with   Constipation   Urinary Retention   Chest Pain    Adam Ray is a 68 y.o. male.  HPI Patient presents for multiple complaints.  Medical history includes COPD, DM, neuropathy, HTN, PAD, sleep apnea, anxiety, depression, HLD, gout, CHF, CKD, CAD.  He is currently 2 weeks postop from revision of left BKA.  He states that he typically has a bowel movement every 2 days.  He has not had a bowel movement in the past 4 days.  For the past 24 hours, he has had difficulty urinating.  He arrives today via EMS from [facility] nursing facility.  Per review of nursing facility paperwork, he did undergo In-N-Out catheter last night.  He was given Fleet enema yesterday.  Patient also reports that he has had chest pain and difficulty swallowing.  He describes odynophagia and esophageal phase.  He has has recent nausea and vomiting.  Currently, he denies any areas of discomfort.    Home Medications Prior to Admission medications   Medication Sig Start Date End Date Taking? Authorizing Provider  Accu-Chek Softclix Lancets lancets Use as directed. 08/22/23   Setzer, Lynnell Jude, PA-C  acetaminophen (TYLENOL) 325 MG tablet Take 1-2 tablets (325-650 mg total) by mouth every 6 (six) hours as needed. 08/22/23   Setzer, Lynnell Jude, PA-C  ascorbic acid (VITAMIN C) 1000 MG tablet Take 1 tablet (1,000 mg total) by mouth daily. 08/22/23   Setzer, Lynnell Jude, PA-C  aspirin 81 MG chewable tablet Chew 81 mg by mouth daily.    [provider]  Blood Glucose Monitoring Suppl (BLOOD GLUCOSE MONITOR SYSTEM) w/Device KIT Use in the morning, at noon, and at bedtime. 08/22/23   Setzer, Lynnell Jude, PA-C  CINNAMON PO Take 1,200 mg by mouth 2 (two) times daily. [place]    [provider]  cyanocobalamin (VITAMIN B12) 1000 MCG/ML injection  Inject 1 mL (1,000 mcg total) into the muscle every 30 (thirty) days. 08/22/23   Setzer, Lynnell Jude, PA-C  cyclobenzaprine (FLEXERIL) 5 MG tablet Take 1 tablet (5 mg total) by mouth 3 (three) times daily as needed for muscle spasms. 08/22/23   Setzer, Lynnell Jude, PA-C  Dulaglutide (TRULICITY) 0.75 MG/0.5ML SOAJ Inject 0.75 mg into the skin every 30 (thirty) days.    [provider]  famotidine (PEPCID) 20 MG tablet TAKE 1 TABLET (20 MG TOTAL) BY MOUTH 2 (TWO) TIMES DAILY AS NEEDED FOR HEARTBURN OR INDIGESTION. Patient taking differently: Take 20 mg by mouth daily. 07/14/23   Latrelle Dodrill, MD  finasteride (PROSCAR) 5 MG tablet Take 5 mg by mouth daily. 01/11/23   [provider]  Glucose Blood (BLOOD GLUCOSE TEST STRIPS) STRP Use 1 each by In Vitro route in the morning, at noon, and at bedtime. May substitute to any manufacturer covered by patient's insurance. 08/22/23 09/25/23  Setzer, Lynnell Jude, PA-C  insulin glargine (LANTUS SOLOSTAR) 100 UNIT/ML Solostar Pen Inject 32 Units into the skin daily. 09/02/23   Cyndia Skeeters, DO  Lancet Device MISC 1 each by Does not apply route in the morning, at noon, and at bedtime. May substitute to any manufacturer covered by patient's insurance. 08/22/23 09/21/23  Milinda Antis, PA-C  Lancets Misc. MISC 1 each by Does not apply route in the morning, at noon, and at bedtime. May  substitute to any manufacturer covered by patient's insurance. 08/22/23 09/21/23  Setzer, Lynnell Jude, PA-C  metFORMIN (GLUCOPHAGE) 850 MG tablet Take 1 tablet (850 mg total) by mouth 2 (two) times daily with a meal. 08/22/23   Setzer, Lynnell Jude, PA-C  Multiple Vitamin (MULTIVITAMIN WITH MINERALS) TABS tablet Take 1 tablet by mouth daily.  iron    [provider]  rosuvastatin (CRESTOR) 40 MG tablet Take 1 tablet (40 mg total) by mouth daily at 6 PM. Patient taking differently: Take 20 mg by mouth at bedtime. 05/15/20   Mirian Mo, MD  tamsulosin (FLOMAX) 0.4 MG CAPS capsule  Take 1 capsule (0.4 mg total) by mouth daily. 11/09/21   Latrelle Dodrill, MD  tolterodine (DETROL LA) 4 MG 24 hr capsule Take 4 mg by mouth daily. 10/13/22   [provider]  traMADol (ULTRAM) 50 MG tablet Take 1-2 tablets (50-100 mg total) by mouth every 6 (six) hours as needed for severe pain (pain score 7-10). 09/02/23   Cyndia Skeeters, DO  Vibegron (GEMTESA) 75 MG TABS Take 75 mg by mouth daily.    [provider]  vitamin D3 (CHOLECALCIFEROL) 25 MCG tablet Take 1 tablet (1,000 Units total) by mouth daily. 08/22/23   Setzer, Lynnell Jude, PA-C  levofloxacin (LEVAQUIN) 750 MG tablet Take 1 tablet (750 mg total) by mouth daily. For 14 days 07/07/12 07/24/12  Madolyn Frieze, Etta Quill, MD      Allergies    Codeine, Hydrocodone, Latex, Oxycodone, Morphine, and Propofol    Review of Systems   Review of Systems  HENT:  Positive for trouble swallowing.   Cardiovascular:  Positive for chest pain.  Gastrointestinal:  Positive for abdominal distention, abdominal pain, constipation, nausea and vomiting.  Genitourinary:  Positive for difficulty urinating.  All other systems reviewed and are negative.   Physical Exam Updated Vital Signs BP (!) 143/78   Pulse 86   Temp 98.3 F (36.8 C) (Oral)   Resp 13   SpO2 99%  Physical Exam Vitals and nursing note reviewed.  Constitutional:      General: He is not in acute distress.    Appearance: He is well-developed. He is not ill-appearing, toxic-appearing or diaphoretic.  HENT:     Head: Normocephalic and atraumatic.  Eyes:     Conjunctiva/sclera: Conjunctivae normal.  Cardiovascular:     Rate and Rhythm: Normal rate and regular rhythm.     Heart sounds: No murmur heard. Pulmonary:     Effort: Pulmonary effort is normal. No respiratory distress.     Breath sounds: Normal breath sounds.  Chest:     Chest wall: No tenderness.  Abdominal:     General: There is distension.     Palpations: Abdomen is soft.     Tenderness: There is no  abdominal tenderness.  Musculoskeletal:        General: No swelling. Normal range of motion.     Cervical back: Normal range of motion and neck supple.  Skin:    General: Skin is warm and dry.     Coloration: Skin is not cyanotic or pale.  Neurological:     General: No focal deficit present.     Mental Status: He is alert and oriented to person, place, and time.  Psychiatric:        Mood and Affect: Mood normal.        Behavior: Behavior normal.     ED Results / Procedures / Treatments   Labs (all labs ordered  are listed, but only abnormal results are displayed) Labs Reviewed  CBC WITH DIFFERENTIAL/PLATELET - Abnormal; Notable for the following components:      Result Value   RBC 3.73 (*)    Hemoglobin 10.0 (*)    HCT 33.0 (*)    RDW 16.2 (*)    All other components within normal limits  URINALYSIS, W/ REFLEX TO CULTURE (INFECTION SUSPECTED) - Abnormal; Notable for the following components:   Glucose, UA 50 (*)    All other components within normal limits  COMPREHENSIVE METABOLIC PANEL  LIPASE, BLOOD  MAGNESIUM  TROPONIN I (HIGH SENSITIVITY)    EKG EKG Interpretation Date/Time:  Tuesday September 09 2023 14:24:13 EDT Ventricular Rate:  88 PR Interval:  205 QRS Duration:  145 QT Interval:  411 QTC Calculation: 498 R Axis:   85  Text Interpretation: Sinus rhythm Left bundle branch block Confirmed by Gloris Manchester ([id number]) on 09/09/2023 2:53:31 PM  Radiology No results found.  Procedures Procedures    Medications Ordered in ED Medications  ondansetron (ZOFRAN) injection 4 mg (4 mg Intravenous Given 09/09/23 1523)  lactated ringers bolus 500 mL (has no administration in time range)  alum & mag hydroxide-simeth (MAALOX/MYLANTA) 200-200-20 MG/5ML suspension 30 mL (30 mLs Oral Given 09/09/23 1523)    And  lidocaine (XYLOCAINE) 2 % viscous mouth solution 15 mL (15 mLs Oral Given 09/09/23 1523)    ED Course/ Medical Decision Making/ A&P                                  Medical Decision Making Amount and/or Complexity of Data Reviewed Labs: ordered. Radiology: ordered.  Risk OTC drugs. Prescription drug management.   This patient presents to the ED for concern of multiple complaints, this involves an extensive number of treatment options, and is a complaint that carries with it a high risk of complications and morbidity.  The differential diagnosis includes slow transit constipation, bowel obstruction, polypharmacy, urine retention, UTI, metabolic derangements   Co morbidities that complicate the patient evaluation  COPD, DM, neuropathy, HTN, PAD, sleep apnea, anxiety, depression, HLD, gout, CHF, CKD, CAD   Additional history obtained:  Additional history obtained from EMS External records from outside source obtained and reviewed including EMR   Lab Tests:  I Ordered, and personally interpreted labs.  The pertinent results include: (pending at time of signout)   Imaging Studies ordered:  I ordered imaging studies including chest and abdomen x-ray I independently visualized and interpreted imaging which showed (pending at time of signout) I agree with the radiologist interpretation   Cardiac Monitoring: / EKG:  The patient was maintained on a cardiac monitor.  I personally viewed and interpreted the cardiac monitored which showed an underlying rhythm of: Sinus rhythm  Problem List / ED Course / Critical interventions / Medication management  Patient presenting for urine retention, constipation, nausea, vomiting.  Vital signs on arrival are normal.  Patient is overall well-appearing on exam.  His abdomen does seem mildly distended.  He denies any significant tenderness.  Per review of [facility] nursing facility paperwork, he did undergo In-N-Out catheterization yesterday and was given a Fleet enema.  Currently, he is unable to urinate.  On bedside ultrasound, he has 900 cc of urine in his bladder.  Patient was consented for placement of  indwelling Foley catheter.  Workup was initiated.  Foley was placed with 900 cc of dark urine output.  After drainage  of bladder, patient was able to have a bowel movement on a bedpan.  Care of patient was signed out to oncoming ED provider. I ordered medication including fluids for hydration; GI cocktail for odynophagia Reevaluation of the patient after these medicines showed that the patient improved I have reviewed the patients home medicines and have made adjustments as needed   Social Determinants of Health:  Resides in rehab facility        Final Clinical Impression(s) / ED Diagnoses Final diagnoses:  Constipation, unspecified constipation type  Urinary retention    Rx / DC Orders ED Discharge Orders     None         Gloris Manchester, MD 09/09/23 1526    Gloris Manchester, MD 09/09/23 1535

## 2023-09-09 NOTE — ED Provider Notes (Signed)
 Care transferred to me.  Patient is feeling a lot better after having a bowel movement and having his bladder drained via Foley catheter.  Labs are reassuring including normal kidney function.  Troponin is negative and he states has been having chest pain for about a week.  He has no abdominal tenderness or distention on my exam and I specked all that was from the urinary retention and constipation.  I do not think a CT would be helpful as he is now asymptomatic. Xray is benign. Will discharge.   Pricilla Loveless, MD 09/09/23 [phone]

## 2023-09-09 NOTE — Discharge Instructions (Addendum)
 Leave the Foley catheter in until you are seen by the urologist.  We are prescribing medicines to help with constipation as well.  Return to the ER if you develop any new or worsening symptoms.

## 2023-09-09 NOTE — Progress Notes (Signed)
 Post-Op Visit Note   Patient: Adam Ray           Date of Birth: 07-26-1955           MRN: [id number] Visit Date: 09/05/2023 PCP: Latrelle Dodrill, MD  Chief Complaint:  Chief Complaint  Patient presents with   Left Leg - Routine Post Op    08/27/23 revision left BKA    HPI:  HPI The patient is a 68 year old gentleman seen status post revision left below-knee amputation February 26.   Ortho Exam incisions well-approximated sutures there is no gaping or drainage  Visit Diagnoses: No diagnosis found.  Plan: Begin daily Dial soap cleansing dry dressings continue shrinker and limb protector follow-up in 2 weeks for suture removal  Follow-Up Instructions: No follow-ups on file.   Imaging: No results found.  Orders:  No orders of the defined types were placed in this encounter.  No orders of the defined types were placed in this encounter.    PMFS History: Patient Active Problem List   Diagnosis Date Noted   Amputation of left lower extremity below knee (HCC) 08/26/2023   Diabetes mellitus (HCC) 08/26/2023   Left bundle branch block (LBBB) 08/26/2023   Acute blood loss anemia 08/26/2023   Dehiscence of amputation stump of left lower extremity (HCC) 08/25/2023   S/P BKA (below knee amputation) (HCC) 08/05/2023   Ganglion of foot, left 08/01/2023   S/P BKA (below knee amputation) unilateral, left (HCC) 08/01/2023   Routine adult health maintenance 04/30/2023   Heart murmur, systolic 03/13/2023   Pancytopenia (HCC) 02/18/2023   Bone lesion 02/18/2023   B12 deficiency anemia 02/18/2023   Wound dehiscence, surgical, initial encounter 12/25/2022   Maggot infestation 12/25/2022   Abnormal bone radiograph 12/18/2022   Osteomyelitis of second toe of left foot (HCC) 10/11/2022   Osteomyelitis of great toe of left foot (HCC) 10/11/2022   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   Atrophy of tongue papillae 10/02/2022    Atherosclerosis of coronary artery without angina pectoris 10/02/2022   Age-related nuclear cataract of left eye 10/02/2022   Brown hairy tongue 10/02/2022   Proliferative diabetic retinopathy of right eye associated with type 2 diabetes mellitus (HCC) 10/02/2022   Tongue discoloration 02/14/2022   Hematuria 08/16/2021   Cervical spondylosis without myelopathy 12/14/2020   Pain in joint of left shoulder 12/14/2020   Urinary incontinence 08/10/2020   Increased urinary frequency 05/09/2020   Nocturnal leg cramps 10/28/2019   Hyperlipidemia associated with type 2 diabetes mellitus (HCC) 05/26/2019   Limited mobility 05/12/2018   Anxiety state 06/02/2017   Leg swelling 01/29/2017   Idiopathic chronic venous hypertension of both lower extremities with inflammation 09/24/2016   Diabetic polyneuropathy associated with type 2 diabetes mellitus (HCC)    Type 2 diabetes mellitus with neurologic complication, with long-term current use of insulin (HCC) 12/08/2015   Left shoulder pain 08/10/2015   Pulmonary nodule 02/01/2015   Depression 12/09/2013   Warts, genital 08/20/2013   Diabetic retinopathy (HCC) 01/28/2013   Sleep apnea 09/06/2010   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   Hypertension associated with diabetes (HCC) 11/09/2008   COPD, mild (HCC) 10/06/2006   SICKLE-CELL TRAIT 04/26/2006   ERECTILE DYSFUNCTION 04/26/2006   Tobacco use 04/26/2006   Peripheral arterial disease (HCC) 04/26/2006   Allergic rhinitis 04/26/2006   Past Medical History:  Diagnosis Date   Allergy    Arthritis    Bronchitis    Cataract  CHF (congestive heart failure) (HCC)    Chronic kidney disease    Claudication (HCC)    right foot ray resection   Colon polyps    hyperplastic   COPD (chronic obstructive pulmonary disease) (HCC)    Coronary artery disease    Diabetes mellitus    type II   Genital warts    Gout    Hyperlipidemia    Hypertension    Myocardial infarction (HCC)    Osteomyelitis of  third toe of right foot (HCC)    Pneumonia    Status post amputation of toe of right foot (HCC) 09/24/2016   Status post transmetatarsal amputation of foot, 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/2018   STEMI involving left circumflex coronary artery (HCC) 07/12/2018   Coronary artery disease   Subacute osteomyelitis, right ankle and foot (HCC) 01/29/2016   Testicular mass 04/18/2016    Family History  Problem Relation Age of Onset   Diabetes Mother    Stroke Mother    Heart failure Father    Heart attack Father    Diabetes Sister        multiple siblings   Diabetes Brother        muliple siblings   Heart disease Brother    Colon cancer Neg Hx    Esophageal cancer Neg Hx    Rectal cancer Neg Hx    Stomach cancer Neg Hx     Past Surgical History:  Procedure Laterality Date   AMPUTATION Right 01/31/2016   Procedure: Right 2nd Toe Amputation;  Surgeon: [name], MD;  Location: [facility] OR;  Service: Orthopedics;  Laterality: Right;   AMPUTATION Right 07/08/2018   Procedure: RIGHT TRANSMETATARSAL AMPUTATION;  Surgeon: [name], MD;  Location: [facility] OR;  Service: Orthopedics;  Laterality: Right;   AMPUTATION Right 01/05/2020   Procedure: RIGHT BELOW KNEE AMPUTATION;  Surgeon: [name], MD;  Location: [facility] OR;  Service: Orthopedics;  Laterality: Right;   AMPUTATION Right 02/23/2020   Procedure: RIGHT BELOW KNEE AMPUTATION REVISION;  Surgeon: [name], MD;  Location: [facility] OR;  Service: Orthopedics;  Laterality: Right;   AMPUTATION Left 10/11/2022   Procedure: LEFT GREAT TOE AMPUTATION AND LEFT 2ND TOE AMPUTATION;  Surgeon: [name], MD;  Location: [facility] OR;  Service: Orthopedics;  Laterality: Left;   AMPUTATION Left 12/25/2022   Procedure: LEFT TRANSMETATARSAL AMPUTATION;  Surgeon: [name], MD;  Location: [facility] OR;  Service: Orthopedics;  Laterality: Left;   AMPUTATION Left 08/01/2023   Procedure: LEFT BELOW KNEE AMPUTATION;  Surgeon: [name], MD;  Location: [facility] OR;  Service: Orthopedics;   Laterality: Left;   CATARACT EXTRACTION     right eye   COLONOSCOPY     CORONARY STENT INTERVENTION N/A 07/12/2018   Procedure: CORONARY STENT INTERVENTION;  Surgeon: Lennette Bihari, MD;  Location: [facility] INVASIVE CV LAB;  Service: Cardiovascular;  Laterality: N/A;   CORONARY/GRAFT ACUTE MI REVASCULARIZATION N/A 07/12/2018   Procedure: Coronary/Graft Acute MI Revascularization;  Surgeon: Lennette Bihari, MD;  Location: [facility] INVASIVE CV LAB;  Service: Cardiovascular;  Laterality: N/A;   I & D EXTREMITY  04/11/2012   Procedure: IRRIGATION AND DEBRIDEMENT EXTREMITY;  Surgeon: Toni Arthurs, MD;  Location: [facility] OR;  Service: Orthopedics;  Laterality: Right;   LEFT HEART CATH AND CORONARY ANGIOGRAPHY N/A 07/12/2018   Procedure: LEFT HEART CATH AND CORONARY ANGIOGRAPHY;  Surgeon: Lennette Bihari, MD;  Location: [facility] INVASIVE CV LAB;  Service: Cardiovascular;  Laterality: N/A;   STUMP  REVISION Left 08/27/2023   Procedure: REVISION LEFT BELOW KNEE AMPUTATION;  Surgeon: [name], MD;  Location: [facility] OR;  Service: Orthopedics;  Laterality: Left;   Surgery left great toe     Tear ducts bilateral eyes     TRANSMETATARSAL AMPUTATION Right 07/08/2018   Social History   Occupational History   Occupation: disabled  Tobacco Use   Smoking status: Former    Current packs/day: 0.00    Average packs/day: 0.3 packs/day for 55.9 years (16.8 ttl pk-yrs)    Types: Cigars, Cigarettes    Start date: 07/02/1963    Quit date: 06/01/2019    Years since quitting: 4.2    Passive exposure: Past   Smokeless tobacco: Former  Building services engineer status: Former  Substance and Sexual Activity   Alcohol use: Not Currently   Drug use: No   Sexual activity: Not Currently

## 2023-09-09 NOTE — ED Notes (Signed)
 MD performed bladder scan. seen. Instructed to place a foley cath.

## 2023-09-09 NOTE — ED Notes (Signed)
Bilateral BKA 

## 2023-09-10 ENCOUNTER — Telehealth: Payer: Self-pay | Admitting: Physician Assistant

## 2023-09-10 DIAGNOSIS — T8744 Infection of amputation stump, left lower extremity: Secondary | ICD-10-CM | POA: Diagnosis not present

## 2023-09-10 DIAGNOSIS — E119 Type 2 diabetes mellitus without complications: Secondary | ICD-10-CM | POA: Diagnosis not present

## 2023-09-10 NOTE — Telephone Encounter (Signed)
 Pt scheduled to see Quentin Mulling PA 09/18/23 at 1:30pm. Please let pt know about appt date and time.

## 2023-09-10 NOTE — Telephone Encounter (Signed)
 Patient spouse called and stated that he was seen in the ER yesterday and is now requesting an emergency follow up. Patient is requesting a call back. Please advise.

## 2023-09-11 DIAGNOSIS — T8744 Infection of amputation stump, left lower extremity: Secondary | ICD-10-CM | POA: Diagnosis not present

## 2023-09-11 DIAGNOSIS — E113591 Type 2 diabetes mellitus with proliferative diabetic retinopathy without macular edema, right eye: Secondary | ICD-10-CM | POA: Diagnosis not present

## 2023-09-11 DIAGNOSIS — D62 Acute posthemorrhagic anemia: Secondary | ICD-10-CM | POA: Diagnosis not present

## 2023-09-11 DIAGNOSIS — E119 Type 2 diabetes mellitus without complications: Secondary | ICD-10-CM | POA: Diagnosis not present

## 2023-09-11 DIAGNOSIS — E1169 Type 2 diabetes mellitus with other specified complication: Secondary | ICD-10-CM | POA: Diagnosis not present

## 2023-09-12 DIAGNOSIS — T8744 Infection of amputation stump, left lower extremity: Secondary | ICD-10-CM | POA: Diagnosis not present

## 2023-09-12 DIAGNOSIS — E119 Type 2 diabetes mellitus without complications: Secondary | ICD-10-CM | POA: Diagnosis not present

## 2023-09-15 DIAGNOSIS — E119 Type 2 diabetes mellitus without complications: Secondary | ICD-10-CM | POA: Diagnosis not present

## 2023-09-15 DIAGNOSIS — T8744 Infection of amputation stump, left lower extremity: Secondary | ICD-10-CM | POA: Diagnosis not present

## 2023-09-16 ENCOUNTER — Encounter
Payer: No Typology Code available for payment source | Attending: Physical Medicine and Rehabilitation | Admitting: Physical Medicine and Rehabilitation

## 2023-09-16 DIAGNOSIS — E1169 Type 2 diabetes mellitus with other specified complication: Secondary | ICD-10-CM | POA: Diagnosis not present

## 2023-09-16 DIAGNOSIS — E113591 Type 2 diabetes mellitus with proliferative diabetic retinopathy without macular edema, right eye: Secondary | ICD-10-CM | POA: Diagnosis not present

## 2023-09-16 DIAGNOSIS — D62 Acute posthemorrhagic anemia: Secondary | ICD-10-CM | POA: Diagnosis not present

## 2023-09-17 DIAGNOSIS — E119 Type 2 diabetes mellitus without complications: Secondary | ICD-10-CM | POA: Diagnosis not present

## 2023-09-17 DIAGNOSIS — T8744 Infection of amputation stump, left lower extremity: Secondary | ICD-10-CM | POA: Diagnosis not present

## 2023-09-17 NOTE — Progress Notes (Deleted)
 09/17/2023 Adam Ray [id number] 1955/07/05  Referring provider: Latrelle Dodrill, MD Primary GI doctor: {[id number]}  ASSESSMENT AND PLAN:   Assessment and Plan              Patient Care Team: Latrelle Dodrill, MD as PCP - General (Family Medicine) Runell Gess, MD as PCP - Cardiology (Cardiology) Malachy Mood, MD as Consulting Physician (Hematology and Oncology) Carlean Jews, NP as Nurse Practitioner (Hematology and Oncology)  HISTORY OF PRESENT ILLNESS: 68 y.o. male with a past medical history of ***and others listed below presents for evaluation of ***.   Discussed the use of AI scribe software for clinical note transcription with the patient, who gave verbal consent to proceed.  History of Present Illness            He  reports that he quit smoking about 4 years ago. His smoking use included cigars and cigarettes. He started smoking about 60 years ago. He has a 16.8 pack-year smoking history. He has been exposed to tobacco smoke. He has quit using smokeless tobacco. He reports that he does not currently use alcohol. He reports that he does not use drugs.  RELEVANT GI HISTORY, IMAGING AND LABS: Results          CBC    Component Value Date/Time   WBC 4.5 09/09/2023 1503   RBC 3.73 (L) 09/09/2023 1503   HGB 10.0 (L) 09/09/2023 1503   HGB 11.0 (L) 03/13/2023 1713   HCT 33.0 (L) 09/09/2023 1503   HCT 34.2 (L) 03/13/2023 1713   PLT 194 09/09/2023 1503   PLT 134 (L) 03/13/2023 1713   MCV 88.5 09/09/2023 1503   MCV 83 03/13/2023 1713   MCH 26.8 09/09/2023 1503   MCHC 30.3 09/09/2023 1503   RDW 16.2 (H) 09/09/2023 1503   RDW 13.9 03/13/2023 1713   LYMPHSABS 1.0 09/09/2023 1503   LYMPHSABS 1.1 12/16/2022 1713   MONOABS 0.5 09/09/2023 1503   EOSABS 0.1 09/09/2023 1503   EOSABS 0.1 12/16/2022 1713   BASOSABS 0.0 09/09/2023 1503   BASOSABS 0.0 12/16/2022 1713   Recent Labs    08/27/23 0344 08/27/23 1807 08/28/23 0339  08/28/23 1850 08/29/23 0504 08/29/23 1214 08/30/23 0407 09/01/23 0336 09/02/23 0344 09/09/23 1503  HGB 9.4* 8.8* 7.6* 8.7* 8.0* 8.3* 8.1* 8.2* 8.2* 10.0*    CMP     Component Value Date/Time   NA 135 09/09/2023 1503   NA 141 12/16/2022 1712   K 3.7 09/09/2023 1503   CL 99 09/09/2023 1503   CO2 29 09/09/2023 1503   GLUCOSE 181 (H) 09/09/2023 1503   BUN 15 09/09/2023 1503   BUN 16 12/16/2022 1712   CREATININE 0.73 09/09/2023 1503   CREATININE 1.04 09/15/2015 1521   CALCIUM 8.9 09/09/2023 1503   PROT 6.8 09/09/2023 1503   PROT 6.2 12/16/2022 1712   ALBUMIN 3.4 (L) 09/09/2023 1503   ALBUMIN 4.1 12/16/2022 1712   AST 16 09/09/2023 1503   ALT 13 09/09/2023 1503   ALKPHOS 110 09/09/2023 1503   BILITOT 0.8 09/09/2023 1503   BILITOT 0.3 12/16/2022 1712   GFRNONAA >60 09/09/2023 1503   GFRAA 64 05/15/2020 1450      Latest Ref Rng & Units 09/09/2023    3:03 PM 08/25/2023    4:50 PM 08/06/2023    5:17 AM  Hepatic Function  Total Protein 6.5 - 8.1 g/dL 6.8  6.2  5.8   Albumin 3.5 - 5.0 g/dL 3.4  3.1  2.2   AST 15 - 41 U/L 16  21  48   ALT 0 - 44 U/L 13  17  42   Alk Phosphatase 38 - 126 U/L 110  96  110   Total Bilirubin 0.0 - 1.2 mg/dL 0.8  0.6  0.4       Current Medications:   Current Outpatient Medications (Endocrine & Metabolic):    Dulaglutide (TRULICITY) 0.75 MG/0.5ML SOAJ, Inject 0.75 mg into the skin every 30 (thirty) days.   insulin glargine (LANTUS SOLOSTAR) 100 UNIT/ML Solostar Pen, Inject 32 Units into the skin daily.   metFORMIN (GLUCOPHAGE) 850 MG tablet, Take 1 tablet (850 mg total) by mouth 2 (two) times daily with a meal.  Current Outpatient Medications (Cardiovascular):    rosuvastatin (CRESTOR) 40 MG tablet, Take 1 tablet (40 mg total) by mouth daily at 6 PM. (Patient taking differently: Take 20 mg by mouth at bedtime.)   Current Outpatient Medications (Analgesics):    acetaminophen (TYLENOL) 325 MG tablet, Take 1-2 tablets (325-650 mg total) by mouth  every 6 (six) hours as needed.   aspirin 81 MG chewable tablet, Chew 81 mg by mouth daily.   traMADol (ULTRAM) 50 MG tablet, Take 1-2 tablets (50-100 mg total) by mouth every 6 (six) hours as needed for severe pain (pain score 7-10).  Current Outpatient Medications (Hematological):    cyanocobalamin (VITAMIN B12) 1000 MCG/ML injection, Inject 1 mL (1,000 mcg total) into the muscle every 30 (thirty) days.  Current Outpatient Medications (Other):    Accu-Chek Softclix Lancets lancets, Use as directed.   ascorbic acid (VITAMIN C) 1000 MG tablet, Take 1 tablet (1,000 mg total) by mouth daily.   Blood Glucose Monitoring Suppl (BLOOD GLUCOSE MONITOR SYSTEM) w/Device KIT, Use in the morning, at noon, and at bedtime.   CINNAMON PO, Take 1,200 mg by mouth 2 (two) times daily. [place]   cyclobenzaprine (FLEXERIL) 5 MG tablet, Take 1 tablet (5 mg total) by mouth 3 (three) times daily as needed for muscle spasms.   docusate sodium (COLACE) 100 MG capsule, Take 1 capsule (100 mg total) by mouth every 12 (twelve) hours.   famotidine (PEPCID) 20 MG tablet, TAKE 1 TABLET (20 MG TOTAL) BY MOUTH 2 (TWO) TIMES DAILY AS NEEDED FOR HEARTBURN OR INDIGESTION. (Patient taking differently: Take 20 mg by mouth daily.)   finasteride (PROSCAR) 5 MG tablet, Take 5 mg by mouth daily.   Glucose Blood (BLOOD GLUCOSE TEST STRIPS) STRP, Use 1 each by In Vitro route in the morning, at noon, and at bedtime. May substitute to any manufacturer covered by patient's insurance.   Lancet Device MISC, 1 each by Does not apply route in the morning, at noon, and at bedtime. May substitute to any manufacturer covered by patient's insurance.   Lancets Misc. MISC, 1 each by Does not apply route in the morning, at noon, and at bedtime. May substitute to any manufacturer covered by patient's insurance.   Multiple Vitamin (MULTIVITAMIN WITH MINERALS) TABS tablet, Take 1 tablet by mouth daily.  iron   polyethylene glycol (MIRALAX) 17 g packet, Take  17 g by mouth daily.   tamsulosin (FLOMAX) 0.4 MG CAPS capsule, Take 1 capsule (0.4 mg total) by mouth daily.   tolterodine (DETROL LA) 4 MG 24 hr capsule, Take 4 mg by mouth daily.   Vibegron (GEMTESA) 75 MG TABS, Take 75 mg by mouth daily.   vitamin D3 (CHOLECALCIFEROL) 25 MCG tablet, Take 1 tablet (1,000 Units total) by  mouth daily.  Medical History:  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   Status post transmetatarsal amputation of foot, 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/2018   STEMI involving left circumflex coronary artery (HCC) 07/12/2018   Coronary artery disease   Subacute osteomyelitis, right ankle and foot (HCC) 01/29/2016   Testicular mass 04/18/2016   Allergies:  Allergies  Allergen Reactions   Codeine Other (See Comments)    Heart attack.   Hydrocodone Other (See Comments)    hallucinations   Latex Hives and Itching   Oxycodone Other (See Comments)    hallucinations   Morphine Other (See Comments)    Patient preference   Propofol Other (See Comments)    Patient preference     Surgical History:  He  has a past surgical history that includes Surgery left great toe; Tear ducts bilateral eyes; I & D extremity (04/11/2012); Amputation (Right, 01/31/2016); Cataract extraction; Colonoscopy; Transmetatarsal amputation (Right, 07/08/2018); Amputation (Right, 07/08/2018); Coronary/Graft Acute MI Revascularization (N/A, 07/12/2018); LEFT HEART CATH AND CORONARY  ANGIOGRAPHY (N/A, 07/12/2018); CORONARY STENT INTERVENTION (N/A, 07/12/2018); Amputation (Right, 01/05/2020); Amputation (Right, 02/23/2020); Amputation (Left, 10/11/2022); Amputation (Left, 12/25/2022); Amputation (Left, 08/01/2023); and Stump revision (Left, 08/27/2023). Family History:  His family history includes Diabetes in his brother, mother, and sister; Heart attack in his father; Heart disease in his brother; Heart failure in his father; Stroke in his mother.  REVIEW OF SYSTEMS  : All other systems reviewed and negative except where noted in the History of Present Illness.  PHYSICAL EXAM: There were no vitals taken for this visit. Physical Exam          Doree Albee, PA-C 4:13 PM

## 2023-09-18 ENCOUNTER — Ambulatory Visit: Admitting: Physician Assistant

## 2023-09-18 DIAGNOSIS — E119 Type 2 diabetes mellitus without complications: Secondary | ICD-10-CM | POA: Diagnosis not present

## 2023-09-18 DIAGNOSIS — T8744 Infection of amputation stump, left lower extremity: Secondary | ICD-10-CM | POA: Diagnosis not present

## 2023-09-19 ENCOUNTER — Encounter: Admitting: Family

## 2023-09-19 DIAGNOSIS — T8744 Infection of amputation stump, left lower extremity: Secondary | ICD-10-CM | POA: Diagnosis not present

## 2023-09-19 DIAGNOSIS — E119 Type 2 diabetes mellitus without complications: Secondary | ICD-10-CM | POA: Diagnosis not present

## 2023-09-21 DIAGNOSIS — E119 Type 2 diabetes mellitus without complications: Secondary | ICD-10-CM | POA: Diagnosis not present

## 2023-09-21 DIAGNOSIS — T8744 Infection of amputation stump, left lower extremity: Secondary | ICD-10-CM | POA: Diagnosis not present

## 2023-09-24 ENCOUNTER — Encounter: Payer: Self-pay | Admitting: Family

## 2023-09-24 ENCOUNTER — Ambulatory Visit (INDEPENDENT_AMBULATORY_CARE_PROVIDER_SITE_OTHER): Admitting: Family

## 2023-09-24 DIAGNOSIS — Z89512 Acquired absence of left leg below knee: Secondary | ICD-10-CM

## 2023-09-24 MED ORDER — DOXYCYCLINE HYCLATE 100 MG PO TABS: 100.0000 mg | ORAL_TABLET | Freq: Two times a day (BID) | ORAL | 0 refills | Status: DC

## 2023-09-24 NOTE — Progress Notes (Signed)
 Post-Op Visit Note   Patient: Adam Ray           Date of Birth: Jun 13, 1956           MRN: [id number] Visit Date: 09/24/2023 PCP: Latrelle Dodrill, MD  Chief Complaint:  Chief Complaint  Patient presents with   Wound Check    1 month follow up revision of Left BKA    HPI:  HPI The patient is a 68 year old gentleman who presents status post revision left below-knee amputation.  He reports that he has had a recent fall and has noticed increased drainage from the medial aspect of his incision since  He is very worried that he may have a hematoma as he has had this in the past Ortho Exam On examination left residual limb he has moderate edema.  Laterally this is well-healed immediately there is an area of dehiscence a length of 2 cm in length there is epiboly of the wound edges there is scant old bloody drainage there is no bright red blood there is no purulence there is no erythema or warmth  Visit Diagnoses: No diagnosis found.  Plan: Return demonstration of packing open with silver cell use Ace wrap and ABD pad will wear shrinker over top of this  Out of abundance of caution and placed on doxycycline  Follow-Up Instructions: No follow-ups on file.   Imaging: No results found.  Orders:  No orders of the defined types were placed in this encounter.  No orders of the defined types were placed in this encounter.    PMFS History: Patient Active Problem List   Diagnosis Date Noted   Amputation of left lower extremity below knee (HCC) 08/26/2023   Diabetes mellitus (HCC) 08/26/2023   Left bundle branch block (LBBB) 08/26/2023   Acute blood loss anemia 08/26/2023   Dehiscence of amputation stump of left lower extremity (HCC) 08/25/2023   S/P BKA (below knee amputation) (HCC) 08/05/2023   Ganglion of foot, left 08/01/2023   S/P BKA (below knee amputation) unilateral, left (HCC) 08/01/2023   Routine adult health maintenance 04/30/2023   Heart murmur, systolic  03/13/2023   Pancytopenia (HCC) 02/18/2023   Bone lesion 02/18/2023   B12 deficiency anemia 02/18/2023   Wound dehiscence, surgical, initial encounter 12/25/2022   Maggot infestation 12/25/2022   Abnormal bone radiograph 12/18/2022   Osteomyelitis of second toe of left foot (HCC) 10/11/2022   Osteomyelitis of great toe of left foot (HCC) 10/11/2022   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   Atrophy of tongue papillae 10/02/2022   Atherosclerosis of coronary artery without angina pectoris 10/02/2022   Age-related nuclear cataract of left eye 10/02/2022   Brown hairy tongue 10/02/2022   Proliferative diabetic retinopathy of right eye associated with type 2 diabetes mellitus (HCC) 10/02/2022   Tongue discoloration 02/14/2022   Hematuria 08/16/2021   Cervical spondylosis without myelopathy 12/14/2020   Pain in joint of left shoulder 12/14/2020   Urinary incontinence 08/10/2020   Increased urinary frequency 05/09/2020   Nocturnal leg cramps 10/28/2019   Hyperlipidemia associated with type 2 diabetes mellitus (HCC) 05/26/2019   Limited mobility 05/12/2018   Anxiety state 06/02/2017   Leg swelling 01/29/2017   Idiopathic chronic venous hypertension of both lower extremities with inflammation 09/24/2016   Diabetic polyneuropathy associated with type 2 diabetes mellitus (HCC)    Type 2 diabetes mellitus with neurologic complication, with long-term current use of insulin (HCC) 12/08/2015   Left shoulder pain  08/10/2015   Pulmonary nodule 02/01/2015   Depression 12/09/2013   Warts, genital 08/20/2013   Diabetic retinopathy (HCC) 01/28/2013   Sleep apnea 09/06/2010   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   Hypertension associated with diabetes (HCC) 11/09/2008   COPD, mild (HCC) 10/06/2006   SICKLE-CELL TRAIT 04/26/2006   ERECTILE DYSFUNCTION 04/26/2006   Tobacco use 04/26/2006   Peripheral arterial disease (HCC) 04/26/2006   Allergic rhinitis  04/26/2006   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   Status post transmetatarsal amputation of foot, 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/2018   STEMI involving left circumflex coronary artery (HCC) 07/12/2018   Coronary artery disease   Subacute osteomyelitis, right ankle and foot (HCC) 01/29/2016   Testicular mass 04/18/2016    Family History  Problem Relation Age of Onset   Diabetes Mother    Stroke Mother    Heart failure Father    Heart attack Father    Diabetes Sister        multiple siblings   Diabetes Brother        muliple siblings   Heart disease Brother    Colon cancer Neg Hx    Esophageal cancer Neg Hx    Rectal cancer Neg Hx    Stomach cancer Neg Hx     Past Surgical History:  Procedure Laterality Date   AMPUTATION Right 01/31/2016   Procedure: Right 2nd Toe Amputation;  Surgeon: [name], MD;  Location: [facility] OR;  Service: Orthopedics;  Laterality: Right;   AMPUTATION Right 07/08/2018   Procedure: RIGHT TRANSMETATARSAL AMPUTATION;  Surgeon: [name], MD;  Location: [facility] OR;  Service: Orthopedics;  Laterality: Right;   AMPUTATION Right 01/05/2020   Procedure: RIGHT BELOW KNEE AMPUTATION;  Surgeon: [name], MD;  Location[facility];  Service: Orthopedics;  Laterality: Right;   AMPUTATION Right 02/23/2020   Procedure: RIGHT BELOW KNEE AMPUTATION REVISION;  Surgeon: [name], MD;  Location: [facility] OR;  Service: Orthopedics;  Laterality: Right;    AMPUTATION Left 10/11/2022   Procedure: LEFT GREAT TOE AMPUTATION AND LEFT 2ND TOE AMPUTATION;  Surgeon: [name], MD;  Location: [facility] OR;  Service: Orthopedics;  Laterality: Left;   AMPUTATION Left 12/25/2022   Procedure: LEFT TRANSMETATARSAL AMPUTATION;  Surgeon: [name], MD;  Location: [facility] OR;  Service: Orthopedics;  Laterality: Left;   AMPUTATION Left 08/01/2023   Procedure: LEFT BELOW KNEE AMPUTATION;  Surgeon: [name], MD;  Location: [facility] OR;  Service: Orthopedics;  Laterality: Left;   CATARACT EXTRACTION     right eye   COLONOSCOPY     CORONARY STENT INTERVENTION N/A 07/12/2018   Procedure: CORONARY STENT INTERVENTION;  Surgeon: Lennette Bihari, MD;  Location: [facility] INVASIVE CV LAB;  Service: Cardiovascular;  Laterality: N/A;   CORONARY/GRAFT ACUTE  MI REVASCULARIZATION N/A 07/12/2018   Procedure: Coronary/Graft Acute MI Revascularization;  Surgeon: Lennette Bihari, MD;  Location: [facility] INVASIVE CV LAB;  Service: Cardiovascular;  Laterality: N/A;   I & D EXTREMITY  04/11/2012   Procedure: IRRIGATION AND DEBRIDEMENT EXTREMITY;  Surgeon: Toni Arthurs, MD;  Location: [facility] OR;  Service: Orthopedics;  Laterality: Right;   LEFT HEART CATH AND CORONARY ANGIOGRAPHY N/A 07/12/2018   Procedure: LEFT HEART CATH AND CORONARY ANGIOGRAPHY;  Surgeon: Lennette Bihari, MD;  Location: [facility] INVASIVE CV LAB;  Service: Cardiovascular;  Laterality: N/A;   STUMP REVISION Left 08/27/2023   Procedure: REVISION LEFT BELOW KNEE AMPUTATION;  Surgeon: [name], MD;  Location: [facility] OR;  Service: Orthopedics;  Laterality: Left;   Surgery left great toe     Tear ducts bilateral eyes     TRANSMETATARSAL AMPUTATION Right 07/08/2018   Social History   Occupational History   Occupation: disabled  Tobacco Use   Smoking status: Former    Current packs/day: 0.00    Average packs/day: 0.3 packs/day for 55.9 years (16.8 ttl pk-yrs)    Types: Cigars, Cigarettes    Start date: 07/02/1963    Quit date: 06/01/2019    Years  since quitting: 4.3    Passive exposure: Past   Smokeless tobacco: Former  Building services engineer status: Former  Substance and Sexual Activity   Alcohol use: Not Currently   Drug use: No   Sexual activity: Not Currently

## 2023-09-30 ENCOUNTER — Encounter: Payer: Self-pay | Admitting: Hematology

## 2023-09-30 ENCOUNTER — Ambulatory Visit (INDEPENDENT_AMBULATORY_CARE_PROVIDER_SITE_OTHER): Admitting: Family Medicine

## 2023-09-30 VITALS — BP 109/62 | HR 73 | Ht 70.0 in

## 2023-09-30 DIAGNOSIS — E1141 Type 2 diabetes mellitus with diabetic mononeuropathy: Secondary | ICD-10-CM

## 2023-09-30 DIAGNOSIS — Z794 Long term (current) use of insulin: Secondary | ICD-10-CM | POA: Diagnosis not present

## 2023-09-30 NOTE — Progress Notes (Unsigned)
  Date of Visit: 09/30/2023   SUBJECTIVE:   HPI:  Adam Ray presents today for follow up.  He expresses frustration and disappointment that I did not see him in the hospital during his recent admission for a BKA. Has been following up regularly with ortho for wound management.  Diabetes - currently taking novolog 15 units twice daily and no Lantus according to wife on the phone. He was discharged from the hospital to SNF. Did not bring medication list with him from SNF discharge and is not sure what else he should be taking. CBGs in the 300s. When sugars get high he has difficulty seeing so he feels motivated to get them down.   OBJECTIVE:   BP 109/62   Pulse 73   Ht 5\' 10"  (1.778 m)   SpO2 100%   BMI 24.39 kg/m  Gen: no acute distress, pleasant cooperative HEENT: normocephalic, atraumatic  Heart: regular rate and rhythm, no murmur Lungs: clear to auscultation bilaterally  Neuro: alert, grossly nonfocal, speech normal Ext: LLE BKA site wrapped, did not unwrap  ASSESSMENT/PLAN:   Assessment & Plan Type 2 diabetes mellitus with diabetic mononeuropathy, with long-term current use of insulin (HCC) Uncontrolled, needs to be on basal dosing Start lantus 20u daily, advised hold off on novolog for now until see what his sugars are running with this long acting insulin Difficult to get patient to agree with this regimen, resistant to most advice I had to give today. Offered referral to endocrinology and he agreed. Scheduled close follow up with me on 4/10 at 2pm (outside of my regular clinic) so that his wife can accompany him Asked that he bring his SNF discharge medication list to that visit.    [name], MD [facility]

## 2023-09-30 NOTE — Patient Instructions (Signed)
 It was great to see you again today.   Recommend you take 20 units of lantus daily and let me know how your sugars are doing  Until then hold off on novolog  Come next Thursday at 2pm with the paperwork from the skilled nursing facility  Be well, Dr. Pollie Meyer

## 2023-10-01 ENCOUNTER — Telehealth: Payer: Self-pay | Admitting: Orthopedic Surgery

## 2023-10-01 NOTE — Telephone Encounter (Signed)
 Joni Reining from [facility] called and wanted to know what do you want them to do with the order for him? [phone]

## 2023-10-01 NOTE — Telephone Encounter (Signed)
 Called and sw [facility] advised that per Denny Peon the pt should have silvercell packing, ABD, ace and shrinker on top and change this three times a week. Pt has f/u in the office on Tuesday.

## 2023-10-02 NOTE — Assessment & Plan Note (Signed)
 Uncontrolled, needs to be on basal dosing Start lantus 20u daily, advised hold off on novolog for now until see what his sugars are running with this long acting insulin Difficult to get patient to agree with this regimen, resistant to most advice I had to give today. Offered referral to endocrinology and he agreed. Scheduled close follow up with me on 4/10 at 2pm (outside of my regular clinic) so that his wife can accompany him Asked that he bring his SNF discharge medication list to that visit.

## 2023-10-07 ENCOUNTER — Ambulatory Visit (INDEPENDENT_AMBULATORY_CARE_PROVIDER_SITE_OTHER): Admitting: Orthopedic Surgery

## 2023-10-07 DIAGNOSIS — Z89512 Acquired absence of left leg below knee: Secondary | ICD-10-CM

## 2023-10-07 DIAGNOSIS — T8781 Dehiscence of amputation stump: Secondary | ICD-10-CM

## 2023-10-08 ENCOUNTER — Encounter: Payer: Self-pay | Admitting: Orthopedic Surgery

## 2023-10-08 NOTE — Progress Notes (Signed)
 Office Visit Note   Patient: Adam Ray           Date of Birth: [birth year]           MRN: [id number] Visit Date: 10/07/2023              Requested by: Latrelle Dodrill, MD [address] [place],  [place] [address] PCP: Latrelle Dodrill, MD  Chief Complaint  Patient presents with   Left Leg - Routine Post Op    08/27/2023 revision left BKA       HPI: Patient is a 68 year old gentleman who is status post revision left below-knee amputation February 26.  Patient currently uses a dry dressing limb protector.  Assessment & Plan: Visit Diagnoses:  1. Status post below-knee amputation of left lower extremity (HCC)   2. Dehiscence of amputation stump of left lower extremity (HCC)     Plan: Recommended Dial soap cleansing wicking the wound open with 4 x 4 gauze.  Elevate his leg level with his heart and use compression.  Recommend he continue his 81 mg of aspirin.  Follow-Up Instructions: Return in about 4 weeks (around 11/04/2023).   Ortho Exam  Patient is alert, oriented, no adenopathy, well-dressed, normal affect, normal respiratory effort. Examination slowly there is a small residual wound medially.  There is serosanguineous drainage.  No cellulitis.  Hemoglobin A1c approximately 10.  Imaging: No results found. No images are attached to the encounter.  Labs: Lab Results  Component Value Date   HGBA1C 10.0 (H) 08/01/2023   HGBA1C 10.0 (A) 04/28/2023   HGBA1C 10.3 (A) 02/06/2023   ESRSEDRATE 54 (H) 01/01/2020   ESRSEDRATE 8 01/29/2016   ESRSEDRATE 9 04/11/2012   CRP 2.6 (H) 01/29/2016   CRP 0.5 02/28/2014   REPTSTATUS 09/01/2023 FINAL 08/27/2023   GRAMSTAIN  08/27/2023    RARE WBC PRESENT,BOTH PMN AND MONONUCLEAR NO ORGANISMS SEEN    CULT  08/27/2023    No growth aerobically or anaerobically. Performed at [facility], [address], [place], [place] [address]    LABORGA NO GROWTH 2 DAYS 07/19/2014     Lab Results  Component Value Date    ALBUMIN 3.4 (L) 09/09/2023   ALBUMIN 3.1 (L) 08/25/2023   ALBUMIN 2.2 (L) 08/06/2023   PREALBUMIN 18 08/26/2023   PREALBUMIN 8 (L) 08/01/2023    Lab Results  Component Value Date   MG 1.7 09/09/2023   MG 1.8 09/02/2023   MG 1.7 09/01/2023   No results found for: "VD25OH"  Lab Results  Component Value Date   PREALBUMIN 18 08/26/2023   PREALBUMIN 8 (L) 08/01/2023      Latest Ref Rng & Units 09/09/2023    3:03 PM 09/02/2023    3:44 AM 09/01/2023    3:36 AM  CBC EXTENDED  WBC 4.0 - 10.5 K/uL 4.5  3.9  3.4   RBC 4.22 - 5.81 MIL/uL 3.73  3.01  2.98   Hemoglobin 13.0 - 17.0 g/dL 95.2  8.2  8.2   HCT 84.1 - 52.0 % 33.0  26.1  26.3   Platelets 150 - 400 K/uL 194  129  125   NEUT# 1.7 - 7.7 K/uL 2.9     Lymph# 0.7 - 4.0 K/uL 1.0        There is no height or weight on file to calculate BMI.  Orders:  No orders of the defined types were placed in this encounter.  No orders of the defined types  were placed in this encounter.    Procedures: No procedures performed  Clinical Data: No additional findings.  ROS:  All other systems negative, except as noted in the HPI. Review of Systems  Objective: Vital Signs: There were no vitals taken for this visit.  Specialty Comments:  No specialty comments available.  PMFS History: Patient Active Problem List   Diagnosis Date Noted   Amputation of left lower extremity below knee (HCC) 08/26/2023   Diabetes mellitus (HCC) 08/26/2023   Left bundle branch block (LBBB) 08/26/2023   Acute blood loss anemia 08/26/2023   Dehiscence of amputation stump of left lower extremity (HCC) 08/25/2023   S/P BKA (below knee amputation) (HCC) 08/05/2023   Ganglion of foot, left 08/01/2023   S/P BKA (below knee amputation) unilateral, left (HCC) 08/01/2023   Routine adult health maintenance 04/30/2023   Heart murmur, systolic 03/13/2023   Pancytopenia (HCC) 02/18/2023   Bone lesion 02/18/2023   B12 deficiency anemia 02/18/2023   Wound  dehiscence, surgical, initial encounter 12/25/2022   Maggot infestation 12/25/2022   Abnormal bone radiograph 12/18/2022   Osteomyelitis of second toe of left foot (HCC) 10/11/2022   Osteomyelitis of great toe of left foot (HCC) 10/11/2022   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   Atrophy of tongue papillae 10/02/2022   Atherosclerosis of coronary artery without angina pectoris 10/02/2022   Age-related nuclear cataract of left eye 10/02/2022   Brown hairy tongue 10/02/2022   Proliferative diabetic retinopathy of right eye associated with type 2 diabetes mellitus (HCC) 10/02/2022   Tongue discoloration 02/14/2022   Hematuria 08/16/2021   Cervical spondylosis without myelopathy 12/14/2020   Pain in joint of left shoulder 12/14/2020   Urinary incontinence 08/10/2020   Increased urinary frequency 05/09/2020   Nocturnal leg cramps 10/28/2019   Hyperlipidemia associated with type 2 diabetes mellitus (HCC) 05/26/2019   Limited mobility 05/12/2018   Anxiety state 06/02/2017   Leg swelling 01/29/2017   Idiopathic chronic venous hypertension of both lower extremities with inflammation 09/24/2016   Diabetic polyneuropathy associated with type 2 diabetes mellitus (HCC)    Type 2 diabetes mellitus with neurologic complication, with long-term current use of insulin (HCC) 12/08/2015   Left shoulder pain 08/10/2015   Pulmonary nodule 02/01/2015   Depression 12/09/2013   Warts, genital 08/20/2013   Diabetic retinopathy (HCC) 01/28/2013   Sleep apnea 09/06/2010   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   Hypertension associated with diabetes (HCC) 11/09/2008   COPD, mild (HCC) 10/06/2006   SICKLE-CELL TRAIT 04/26/2006   ERECTILE DYSFUNCTION 04/26/2006   Tobacco use 04/26/2006   Peripheral arterial disease (HCC) 04/26/2006   Allergic rhinitis 04/26/2006   Past Medical History:  Diagnosis Date   Allergy    Arthritis    Bronchitis    Cataract    CHF  (congestive heart failure) (HCC)    Chronic kidney disease    Claudication (HCC)    right foot ray resection   Colon polyps    hyperplastic   COPD (chronic obstructive pulmonary disease) (HCC)    Coronary artery disease    Diabetes mellitus    type II   Genital warts    Gout    Hyperlipidemia    Hypertension    Myocardial infarction (HCC)    Osteomyelitis of third toe of right foot (HCC)    Pneumonia    Status post amputation of toe of right foot (HCC) 09/24/2016   Status post transmetatarsal amputation of foot, 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/2018  STEMI involving left circumflex coronary artery (HCC) 07/12/2018   Coronary artery disease   Subacute osteomyelitis, right ankle and foot (HCC) 01/29/2016   Testicular mass 04/18/2016    Family History  Problem Relation Age of Onset   Diabetes Mother    Stroke Mother    Heart failure Father    Heart attack Father    Diabetes Sister        multiple siblings   Diabetes Brother        muliple siblings   Heart disease Brother    Colon cancer Neg Hx    Esophageal cancer Neg Hx    Rectal cancer Neg Hx    Stomach cancer Neg Hx     Past Surgical History:  Procedure Laterality Date   AMPUTATION Right 01/31/2016   Procedure: Right 2nd Toe Amputation;  Surgeon: [name], MD;  Location: [facility] OR;  Service: Orthopedics;  Laterality: Right;   AMPUTATION Right 07/08/2018   Procedure: RIGHT TRANSMETATARSAL AMPUTATION;  Surgeon: [name], MD;  Location: [facility] OR;  Service: Orthopedics;  Laterality: Right;   AMPUTATION Right 01/05/2020   Procedure: RIGHT BELOW KNEE AMPUTATION;  Surgeon: [name], MD;  Location: [facility] OR;  Service: Orthopedics;  Laterality: Right;   AMPUTATION Right 02/23/2020   Procedure: RIGHT BELOW KNEE AMPUTATION REVISION;  Surgeon: [name], MD;  Location: [facility] OR;  Service: Orthopedics;  Laterality: Right;   AMPUTATION Left 10/11/2022   Procedure: LEFT GREAT TOE AMPUTATION AND LEFT 2ND TOE AMPUTATION;  Surgeon: [name], MD;  Location: [facility] OR;  Service: Orthopedics;  Laterality: Left;   AMPUTATION Left 12/25/2022   Procedure: LEFT TRANSMETATARSAL AMPUTATION;  Surgeon: [name], MD;  Location: [facility] OR;  Service: Orthopedics;  Laterality: Left;   AMPUTATION Left 08/01/2023   Procedure: LEFT BELOW KNEE AMPUTATION;  Surgeon: [name], MD;  Location: [facility] OR;  Service: Orthopedics;  Laterality: Left;   CATARACT EXTRACTION     right eye   COLONOSCOPY     CORONARY STENT INTERVENTION N/A 07/12/2018   Procedure: CORONARY STENT INTERVENTION;  Surgeon: Lennette Bihari, MD;  [facility]: [facility] LAB;  Service: Cardiovascular;  Laterality: N/A;   CORONARY/GRAFT ACUTE MI REVASCULARIZATION N/A 07/12/2018   Procedure: Coronary/Graft Acute MI Revascularization;  Surgeon: Lennette Bihari, MD;  Location: [facility] LAB;  Service: Cardiovascular;  Laterality: N/A;   I & D EXTREMITY  04/11/2012   Procedure: IRRIGATION AND DEBRIDEMENT EXTREMITY;  Surgeon: Toni Arthurs, MD;  Location: [facility] OR;  Service: Orthopedics;  Laterality: Right;   LEFT HEART CATH AND CORONARY ANGIOGRAPHY N/A 07/12/2018   Procedure: LEFT HEART CATH AND CORONARY ANGIOGRAPHY;  Surgeon: Lennette Bihari, MD;  Location: [facility] LAB;  Service: Cardiovascular;  Laterality: N/A;   STUMP REVISION Left 08/27/2023   Procedure: REVISION LEFT BELOW KNEE AMPUTATION;  Surgeon: [name], MD;  Location: [facility] OR;  Service: Orthopedics;  Laterality: Left;   Surgery left great toe     Tear ducts bilateral eyes     TRANSMETATARSAL AMPUTATION Right 07/08/2018   Social History   Occupational History   Occupation: disabled  Tobacco Use   Smoking status: Former    Current packs/day: 0.00    Average packs/day: 0.3 packs/day for 55.9 years (16.8 ttl pk-yrs)    Types: Cigars, Cigarettes    Start date: 07/02/1963    Quit date: 06/01/2019    Years since quitting: 4.3    Passive exposure: Past  Smokeless tobacco: Former  Building services engineer status: Former   Substance and Sexual Activity   Alcohol use: Not Currently   Drug use: No   Sexual activity: Not Currently

## 2023-10-09 ENCOUNTER — Ambulatory Visit: Admitting: Family Medicine

## 2023-10-14 ENCOUNTER — Ambulatory Visit (INDEPENDENT_AMBULATORY_CARE_PROVIDER_SITE_OTHER): Admitting: Orthopedic Surgery

## 2023-10-14 ENCOUNTER — Encounter: Payer: Self-pay | Admitting: Orthopedic Surgery

## 2023-10-14 DIAGNOSIS — T8781 Dehiscence of amputation stump: Secondary | ICD-10-CM

## 2023-10-14 DIAGNOSIS — Z89512 Acquired absence of left leg below knee: Secondary | ICD-10-CM

## 2023-10-14 NOTE — Addendum Note (Signed)
 Addended by: Gearldean Keepers on: 10/14/2023 01:26 PM   Modules accepted: Level of Service

## 2023-10-14 NOTE — Progress Notes (Addendum)
 Office Visit Note   Patient: Adam Ray           Date of Birth: January 30, 1956           MRN: [id number] Visit Date: 10/14/2023              Requested by: Latrelle Dodrill, MD [address] [place],  [place] [address] PCP: Latrelle Dodrill, MD  Chief Complaint  Patient presents with   Left Leg - Routine Post Op      HPI: Patient is a 68 year old gentleman who presents in follow-up for dehiscence left transtibial amputation.  He is 10 weeks out.  Assessment & Plan: Visit Diagnoses:  1. Status post below-knee amputation of left lower extremity (HCC)   2. Dehiscence of amputation stump of left lower extremity (HCC)     Plan: Continue with bowel soap cleansing pack the wound open with 4 x 4 gauze and an Ace wrap for compression.  Follow-Up Instructions: Return in about 2 weeks (around 10/28/2023).   Ortho Exam  Patient is alert, oriented, no adenopathy, well-dressed, normal affect, normal respiratory effort. Examination the clear serous drainage has resolved.  Patient has 1 remaining wound 1 cm diameter 1 cm deep over the medial residual limb.  There is no cellulitis.  This was touched with silver nitrate.  The remainder of the sutures are harvested today.  Imaging: No results found. No images are attached to the encounter.  Labs: Lab Results  Component Value Date   HGBA1C 10.0 (H) 08/01/2023   HGBA1C 10.0 (A) 04/28/2023   HGBA1C 10.3 (A) 02/06/2023   ESRSEDRATE 54 (H) 01/01/2020   ESRSEDRATE 8 01/29/2016   ESRSEDRATE 9 04/11/2012   CRP 2.6 (H) 01/29/2016   CRP 0.5 02/28/2014   REPTSTATUS 09/01/2023 FINAL 08/27/2023   GRAMSTAIN  08/27/2023    RARE WBC PRESENT,BOTH PMN AND MONONUCLEAR NO ORGANISMS SEEN    CULT  08/27/2023    No growth aerobically or anaerobically. Performed at [facility], [address], [place], [place] [address]    LABORGA NO GROWTH 2 DAYS 07/19/2014     Lab Results  Component Value Date   ALBUMIN 3.4 (L) 09/09/2023    ALBUMIN 3.1 (L) 08/25/2023   ALBUMIN 2.2 (L) 08/06/2023   PREALBUMIN 18 08/26/2023   PREALBUMIN 8 (L) 08/01/2023    Lab Results  Component Value Date   MG 1.7 09/09/2023   MG 1.8 09/02/2023   MG 1.7 09/01/2023   No results found for: "VD25OH"  Lab Results  Component Value Date   PREALBUMIN 18 08/26/2023   PREALBUMIN 8 (L) 08/01/2023      Latest Ref Rng & Units 09/09/2023    3:03 PM 09/02/2023    3:44 AM 09/01/2023    3:36 AM  CBC EXTENDED  WBC 4.0 - 10.5 K/uL 4.5  3.9  3.4   RBC 4.22 - 5.81 MIL/uL 3.73  3.01  2.98   Hemoglobin 13.0 - 17.0 g/dL 82.9  8.2  8.2   HCT 56.2 - 52.0 % 33.0  26.1  26.3   Platelets 150 - 400 K/uL 194  129  125   NEUT# 1.7 - 7.7 K/uL 2.9     Lymph# 0.7 - 4.0 K/uL 1.0        There is no height or weight on file to calculate BMI.  Orders:  No orders of the defined types were placed in this encounter.  No orders of the defined types were placed  in this encounter.    Procedures: No procedures performed  Clinical Data: No additional findings.  ROS:  All other systems negative, except as noted in the HPI. Review of Systems  Objective: Vital Signs: There were no vitals taken for this visit.  Specialty Comments:  No specialty comments available.  PMFS History: Patient Active Problem List   Diagnosis Date Noted   Amputation of left lower extremity below knee (HCC) 08/26/2023   Diabetes mellitus (HCC) 08/26/2023   Left bundle branch block (LBBB) 08/26/2023   Acute blood loss anemia 08/26/2023   Dehiscence of amputation stump of left lower extremity (HCC) 08/25/2023   S/P BKA (below knee amputation) (HCC) 08/05/2023   Ganglion of foot, left 08/01/2023   S/P BKA (below knee amputation) unilateral, left (HCC) 08/01/2023   Routine adult health maintenance 04/30/2023   Heart murmur, systolic 03/13/2023   Pancytopenia (HCC) 02/18/2023   Bone lesion 02/18/2023   B12 deficiency anemia 02/18/2023   Wound dehiscence, surgical, initial  encounter 12/25/2022   Maggot infestation 12/25/2022   Abnormal bone radiograph 12/18/2022   Osteomyelitis of second toe of left foot (HCC) 10/11/2022   Osteomyelitis of great toe of left foot (HCC) 10/11/2022   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   Atrophy of tongue papillae 10/02/2022   Atherosclerosis of coronary artery without angina pectoris 10/02/2022   Age-related nuclear cataract of left eye 10/02/2022   Brown hairy tongue 10/02/2022   Proliferative diabetic retinopathy of right eye associated with type 2 diabetes mellitus (HCC) 10/02/2022   Tongue discoloration 02/14/2022   Hematuria 08/16/2021   Cervical spondylosis without myelopathy 12/14/2020   Pain in joint of left shoulder 12/14/2020   Urinary incontinence 08/10/2020   Increased urinary frequency 05/09/2020   Nocturnal leg cramps 10/28/2019   Hyperlipidemia associated with type 2 diabetes mellitus (HCC) 05/26/2019   Limited mobility 05/12/2018   Anxiety state 06/02/2017   Leg swelling 01/29/2017   Idiopathic chronic venous hypertension of both lower extremities with inflammation 09/24/2016   Diabetic polyneuropathy associated with type 2 diabetes mellitus (HCC)    Type 2 diabetes mellitus with neurologic complication, with long-term current use of insulin (HCC) 12/08/2015   Left shoulder pain 08/10/2015   Pulmonary nodule 02/01/2015   Depression 12/09/2013   Warts, genital 08/20/2013   Diabetic retinopathy (HCC) 01/28/2013   Sleep apnea 09/06/2010   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   Hypertension associated with diabetes (HCC) 11/09/2008   COPD, mild (HCC) 10/06/2006   SICKLE-CELL TRAIT 04/26/2006   ERECTILE DYSFUNCTION 04/26/2006   Tobacco use 04/26/2006   Peripheral arterial disease (HCC) 04/26/2006   Allergic rhinitis 04/26/2006   Past Medical History:  Diagnosis Date   Allergy    Arthritis    Bronchitis    Cataract    CHF (congestive heart failure) (HCC)     Chronic kidney disease    Claudication (HCC)    right foot ray resection   Colon polyps    hyperplastic   COPD (chronic obstructive pulmonary disease) (HCC)    Coronary artery disease    Diabetes mellitus    type II   Genital warts    Gout    Hyperlipidemia    Hypertension    Myocardial infarction (HCC)    Osteomyelitis of third toe of right foot (HCC)    Pneumonia    Status post amputation of toe of right foot (HCC) 09/24/2016   Status post transmetatarsal amputation of foot, 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/2018  STEMI involving left circumflex coronary artery (HCC) 07/12/2018   Coronary artery disease   Subacute osteomyelitis, right ankle and foot (HCC) 01/29/2016   Testicular mass 04/18/2016    Family History  Problem Relation Age of Onset   Diabetes Mother    Stroke Mother    Heart failure Father    Heart attack Father    Diabetes Sister        multiple siblings   Diabetes Brother        muliple siblings   Heart disease Brother    Colon cancer Neg Hx    Esophageal cancer Neg Hx    Rectal cancer Neg Hx    Stomach cancer Neg Hx     Past Surgical History:  Procedure Laterality Date   AMPUTATION Right 01/31/2016   Procedure: Right 2nd Toe Amputation;  Surgeon: Timothy Ford, MD;  Location: [facility] OR;  Service: Orthopedics;  Laterality: Right;   AMPUTATION Right 07/08/2018   Procedure: RIGHT TRANSMETATARSAL AMPUTATION;  Surgeon: Timothy Ford, MD;  Location: [facility] OR;  Service: Orthopedics;  Laterality: Right;   AMPUTATION Right 01/05/2020   Procedure: RIGHT BELOW KNEE AMPUTATION;  Surgeon: Timothy Ford, MD;  Location: [facility] OR;  Service: Orthopedics;  Laterality: Right;   AMPUTATION Right 02/23/2020   Procedure: RIGHT BELOW KNEE AMPUTATION REVISION;  Surgeon: Timothy Ford, MD;  Location: [facility] OR;  Service: Orthopedics;  Laterality: Right;   AMPUTATION Left 10/11/2022   Procedure: LEFT GREAT TOE AMPUTATION AND LEFT 2ND TOE AMPUTATION;  Surgeon: Timothy Ford, MD;  Location: [facility] OR;  Service:  Orthopedics;  Laterality: Left;   AMPUTATION Left 12/25/2022   Procedure: LEFT TRANSMETATARSAL AMPUTATION;  Surgeon: Timothy Ford, MD;  Location: [facility] OR;  Service: Orthopedics;  Laterality: Left;   AMPUTATION Left 08/01/2023   Procedure: LEFT BELOW KNEE AMPUTATION;  Surgeon: Timothy Ford, MD;  Location: [facility] OR;  Service: Orthopedics;  Laterality: Left;   CATARACT EXTRACTION     right eye   COLONOSCOPY     CORONARY STENT INTERVENTION N/A 07/12/2018   Procedure: CORONARY STENT INTERVENTION;  Surgeon: Millicent Ally, MD;  Location: [facility] LAB;  Service: Cardiovascular;  Laterality: N/A;   CORONARY/GRAFT ACUTE MI REVASCULARIZATION N/A 07/12/2018   Procedure: Coronary/Graft Acute MI Revascularization;  Surgeon: Millicent Ally, MD;  Location: [facility] LAB;  Service: Cardiovascular;  Laterality: N/A;   I & D EXTREMITY  04/11/2012   Procedure: IRRIGATION AND DEBRIDEMENT EXTREMITY;  Surgeon: Amada Backer, MD;  Location: [facility] OR;  Service: Orthopedics;  Laterality: Right;   LEFT HEART CATH AND CORONARY ANGIOGRAPHY N/A 07/12/2018   Procedure: LEFT HEART CATH AND CORONARY ANGIOGRAPHY;  Surgeon: Millicent Ally, MD;  Location: [facility];  Service: Cardiovascular;  Laterality: N/A;   STUMP REVISION Left 08/27/2023   Procedure: REVISION LEFT BELOW KNEE AMPUTATION;  Surgeon: Timothy Ford, MD;  Location: [facility] OR;  Service: Orthopedics;  Laterality: Left;   Surgery left great toe     Tear ducts bilateral eyes     TRANSMETATARSAL AMPUTATION Right 07/08/2018   Social History   Occupational History   Occupation: disabled  Tobacco Use   Smoking status: Former    Current packs/day: 0.00    Average packs/day: 0.3 packs/day for 55.9 years (16.8 ttl pk-yrs)    Types: Cigars, Cigarettes    Start date: 07/02/1963    Quit date: 06/01/2019    Years since quitting: 4.3    Passive exposure: Past  Smokeless tobacco: Former  Building services engineer status: Former  Substance and Sexual Activity    Alcohol use: Not Currently   Drug use: No   Sexual activity: Not Currently

## 2023-10-16 ENCOUNTER — Other Ambulatory Visit (HOSPITAL_COMMUNITY): Payer: Self-pay

## 2023-10-21 ENCOUNTER — Encounter: Payer: Self-pay | Admitting: Hematology

## 2023-10-22 ENCOUNTER — Other Ambulatory Visit (INDEPENDENT_AMBULATORY_CARE_PROVIDER_SITE_OTHER)

## 2023-10-22 ENCOUNTER — Ambulatory Visit (INDEPENDENT_AMBULATORY_CARE_PROVIDER_SITE_OTHER): Admitting: Family

## 2023-10-22 ENCOUNTER — Encounter: Payer: Self-pay | Admitting: Family

## 2023-10-22 DIAGNOSIS — T8781 Dehiscence of amputation stump: Secondary | ICD-10-CM

## 2023-10-22 DIAGNOSIS — Z89512 Acquired absence of left leg below knee: Secondary | ICD-10-CM

## 2023-10-22 MED ORDER — AMOXICILLIN-POT CLAVULANATE 500-125 MG PO TABS: 1.0000 | ORAL_TABLET | Freq: Three times a day (TID) | ORAL | 0 refills | Status: DC

## 2023-10-22 NOTE — Progress Notes (Signed)
 Post-Op Visit Note   Patient: Adam Ray           Date of Birth: May 09, 1956           MRN: [id number] Visit Date: 10/22/2023 PCP: Candee Cha, MD  Chief Complaint:  Chief Complaint  Patient presents with   Left Leg - Routine Post Op    08/27/2023 revision left BKA     HPI:  HPI The patient is a 68 year old gentleman who seen today status post revision left below-knee amputation February 26.  He and his wife are quite concerned about the slow healing of his incision  Frustrated as he has had multiple surgeries to the left lower extremity  Complaining of a foul odor and increased pain to the shin Ortho Exam On examination left residual limb medially the area of dehiscence is about 3 cm in length this probes 15 mm deep.  Moderate edema present no pitting.  There is no exposed bone there is no surrounding erythema no weeping no purulence  Visit Diagnoses:  1. Status post below-knee amputation of left lower extremity (HCC)   2. Dehiscence of amputation stump of left lower extremity (HCC)     Plan: Concern for further limb salvage intervention.  Patient is adamant that we proceed with conservative measures will place him on a course of Bactrim he will pack open with Vashe soaked dressings cleanse daily discussed return precautions they will discuss mixups with Dr. Julio Ohm in 1 week Follow-Up Instructions: No follow-ups on file.   Imaging: No results found.  Orders:  Orders Placed This Encounter  Procedures   XR Tibia/Fibula Left   No orders of the defined types were placed in this encounter.    PMFS History: Patient Active Problem List   Diagnosis Date Noted   Amputation of left lower extremity below knee (HCC) 08/26/2023   Diabetes mellitus (HCC) 08/26/2023   Left bundle branch block (LBBB) 08/26/2023   Acute blood loss anemia 08/26/2023   Dehiscence of amputation stump of left lower extremity (HCC) 08/25/2023   S/P BKA (below knee amputation) (HCC)  08/05/2023   Ganglion of foot, left 08/01/2023   S/P BKA (below knee amputation) unilateral, left (HCC) 08/01/2023   Routine adult health maintenance 04/30/2023   Heart murmur, systolic 03/13/2023   Pancytopenia (HCC) 02/18/2023   Bone lesion 02/18/2023   B12 deficiency anemia 02/18/2023   Wound dehiscence, surgical, initial encounter 12/25/2022   Maggot infestation 12/25/2022   Abnormal bone radiograph 12/18/2022   Osteomyelitis of second toe of left foot (HCC) 10/11/2022   Osteomyelitis of great toe of left foot (HCC) 10/11/2022   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   Atrophy of tongue papillae 10/02/2022   Atherosclerosis of coronary artery without angina pectoris 10/02/2022   Age-related nuclear cataract of left eye 10/02/2022   Brown hairy tongue 10/02/2022   Proliferative diabetic retinopathy of right eye associated with type 2 diabetes mellitus (HCC) 10/02/2022   Tongue discoloration 02/14/2022   Hematuria 08/16/2021   Cervical spondylosis without myelopathy 12/14/2020   Pain in joint of left shoulder 12/14/2020   Urinary incontinence 08/10/2020   Increased urinary frequency 05/09/2020   Nocturnal leg cramps 10/28/2019   Hyperlipidemia associated with type 2 diabetes mellitus (HCC) 05/26/2019   Limited mobility 05/12/2018   Anxiety state 06/02/2017   Leg swelling 01/29/2017   Idiopathic chronic venous hypertension of both lower extremities with inflammation 09/24/2016   Diabetic polyneuropathy associated with type 2  diabetes mellitus (HCC)    Type 2 diabetes mellitus with neurologic complication, with long-term current use of insulin  (HCC) 12/08/2015   Left shoulder pain 08/10/2015   Pulmonary nodule 02/01/2015   Depression 12/09/2013   Warts, genital 08/20/2013   Diabetic retinopathy (HCC) 01/28/2013   Sleep apnea 09/06/2010   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   Hypertension associated with diabetes (HCC) 11/09/2008   COPD,  mild (HCC) 10/06/2006   SICKLE-CELL TRAIT 04/26/2006   ERECTILE DYSFUNCTION 04/26/2006   Tobacco use 04/26/2006   Peripheral arterial disease (HCC) 04/26/2006   Allergic rhinitis 04/26/2006   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   Status post transmetatarsal amputation of foot, 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/2018   STEMI involving left circumflex coronary artery (HCC) 07/12/2018   Coronary artery disease   Subacute osteomyelitis, right ankle and foot (HCC) 01/29/2016   Testicular mass 04/18/2016    Family History  Problem Relation Age of Onset   Diabetes Mother    Stroke Mother    Heart failure Father    Heart attack Father    Diabetes Sister        multiple siblings   Diabetes Brother        muliple siblings   Heart disease Brother    Colon cancer Neg Hx    Esophageal cancer Neg Hx    Rectal cancer Neg Hx    Stomach cancer Neg Hx     Past Surgical History:  Procedure Laterality Date   AMPUTATION Right 01/31/2016   Procedure: Right 2nd Toe Amputation;  Surgeon: Timothy Ford, MD;  Location: [facility] OR;  Service: Orthopedics;  Laterality: Right;   AMPUTATION Right 07/08/2018   Procedure: RIGHT TRANSMETATARSAL AMPUTATION;  Surgeon: Timothy Ford, MD;  Location: [facility] OR;  Service: Orthopedics;  Laterality: Right;   AMPUTATION Right 01/05/2020   Procedure: RIGHT BELOW KNEE AMPUTATION;  Surgeon: Timothy Ford, MD;  Location: [facility];  Service: Orthopedics;  Laterality:  Right;   AMPUTATION Right 02/23/2020   Procedure: RIGHT BELOW KNEE AMPUTATION REVISION;  Surgeon: Timothy Ford, MD;  Location: [facility] OR;  Service: Orthopedics;  Laterality: Right;   AMPUTATION Left 10/11/2022   Procedure: LEFT GREAT TOE AMPUTATION AND LEFT 2ND TOE AMPUTATION;  Surgeon: Timothy Ford, MD;  Location: [facility] OR;  Service: Orthopedics;  Laterality: Left;   AMPUTATION Left 12/25/2022   Procedure: LEFT TRANSMETATARSAL AMPUTATION;  Surgeon: Timothy Ford, MD;  Location: [facility] OR;  Service: Orthopedics;  Laterality: Left;   AMPUTATION Left 08/01/2023   Procedure: LEFT BELOW KNEE AMPUTATION;  Surgeon: Timothy Ford, MD;  Location: [facility] OR;  Service: Orthopedics;  Laterality: Left;   CATARACT EXTRACTION     right eye   COLONOSCOPY     CORONARY STENT INTERVENTION N/A 07/12/2018  Procedure: CORONARY STENT INTERVENTION;  Surgeon: Millicent Ally, MD;  Location: [facility] INVASIVE CV LAB;  Service: Cardiovascular;  Laterality: N/A;   CORONARY/GRAFT ACUTE MI REVASCULARIZATION N/A 07/12/2018   Procedure: Coronary/Graft Acute MI Revascularization;  Surgeon: Millicent Ally, MD;  Location: [facility] INVASIVE CV LAB;  Service: Cardiovascular;  Laterality: N/A;   I & D EXTREMITY  04/11/2012   Procedure: IRRIGATION AND DEBRIDEMENT EXTREMITY;  Surgeon: Amada Backer, MD;  Location: [facility] OR;  Service: Orthopedics;  Laterality: Right;   LEFT HEART CATH AND CORONARY ANGIOGRAPHY N/A 07/12/2018   Procedure: LEFT HEART CATH AND CORONARY ANGIOGRAPHY;  Surgeon: Millicent Ally, MD;  Location: [facility] INVASIVE CV LAB;  Service: Cardiovascular;  Laterality: N/A;   STUMP REVISION Left 08/27/2023   Procedure: REVISION LEFT BELOW KNEE AMPUTATION;  Surgeon: Timothy Ford, MD;  Location: [facility] OR;  Service: Orthopedics;  Laterality: Left;   Surgery left great toe     Tear ducts bilateral eyes     TRANSMETATARSAL AMPUTATION Right 07/08/2018   Social History   Occupational History   Occupation: disabled  Tobacco Use   Smoking status: Former     Current packs/day: 0.00    Average packs/day: 0.3 packs/day for 55.9 years (16.8 ttl pk-yrs)    Types: Cigars, Cigarettes    Start date: 07/02/1963    Quit date: 06/01/2019    Years since quitting: 4.3    Passive exposure: Past   Smokeless tobacco: Former  Building services engineer status: Former  Substance and Sexual Activity   Alcohol use: Not Currently   Drug use: No   Sexual activity: Not Currently

## 2023-10-28 ENCOUNTER — Encounter: Admitting: Orthopedic Surgery

## 2023-11-07 DIAGNOSIS — R3914 Feeling of incomplete bladder emptying: Secondary | ICD-10-CM | POA: Diagnosis not present

## 2023-12-02 ENCOUNTER — Telehealth: Payer: Self-pay | Admitting: Family Medicine

## 2023-12-02 NOTE — Telephone Encounter (Signed)
 Patient was identified as falling into the [place] Measure - Diabetes.   Patient was: Left voicemail to schedule with primary care provider.

## 2023-12-19 ENCOUNTER — Ambulatory Visit: Admitting: Family

## 2023-12-19 DIAGNOSIS — R350 Frequency of micturition: Secondary | ICD-10-CM | POA: Diagnosis not present

## 2023-12-19 DIAGNOSIS — R3914 Feeling of incomplete bladder emptying: Secondary | ICD-10-CM | POA: Diagnosis not present

## 2023-12-22 ENCOUNTER — Ambulatory Visit (INDEPENDENT_AMBULATORY_CARE_PROVIDER_SITE_OTHER): Admitting: Family Medicine

## 2023-12-22 ENCOUNTER — Encounter: Payer: Self-pay | Admitting: Family Medicine

## 2023-12-22 VITALS — BP 100/58 | HR 64 | Ht 70.0 in | Wt 145.6 lb

## 2023-12-22 DIAGNOSIS — Z794 Long term (current) use of insulin: Secondary | ICD-10-CM | POA: Diagnosis not present

## 2023-12-22 DIAGNOSIS — E1141 Type 2 diabetes mellitus with diabetic mononeuropathy: Secondary | ICD-10-CM | POA: Diagnosis not present

## 2023-12-22 DIAGNOSIS — Z0000 Encounter for general adult medical examination without abnormal findings: Secondary | ICD-10-CM

## 2023-12-22 DIAGNOSIS — Z89512 Acquired absence of left leg below knee: Secondary | ICD-10-CM | POA: Diagnosis not present

## 2023-12-22 DIAGNOSIS — Z89511 Acquired absence of right leg below knee: Secondary | ICD-10-CM | POA: Diagnosis not present

## 2023-12-22 DIAGNOSIS — K5900 Constipation, unspecified: Secondary | ICD-10-CM | POA: Diagnosis not present

## 2023-12-22 DIAGNOSIS — R32 Unspecified urinary incontinence: Secondary | ICD-10-CM

## 2023-12-22 LAB — POCT GLYCOSYLATED HEMOGLOBIN (HGB A1C): HbA1c, POC (controlled diabetic range): 10.7 % — AB (ref 0.0–7.0)

## 2023-12-22 MED ORDER — TAMSULOSIN HCL 0.4 MG PO CAPS: 0.8000 mg | ORAL_CAPSULE | Freq: Every day | ORAL | 1 refills | Status: AC

## 2023-12-22 MED ORDER — POLYETHYLENE GLYCOL 3350 17 G PO PACK: 17.0000 g | PACK | Freq: Every day | ORAL | 0 refills | Status: DC

## 2023-12-22 MED ORDER — LANTUS SOLOSTAR 100 UNIT/ML ~~LOC~~ SOPN
25.0000 [IU] | PEN_INJECTOR | Freq: Every day | SUBCUTANEOUS | 2 refills | Status: DC
Start: 2023-12-22 — End: 2023-12-31

## 2023-12-22 MED ORDER — FINASTERIDE 5 MG PO TABS: 5.0000 mg | ORAL_TABLET | Freq: Every day | ORAL | 0 refills | Status: AC

## 2023-12-22 MED ORDER — DOCUSATE SODIUM 100 MG PO CAPS: 100.0000 mg | ORAL_CAPSULE | Freq: Two times a day (BID) | ORAL | 0 refills | Status: DC

## 2023-12-22 NOTE — Progress Notes (Unsigned)
  Date of Visit: 12/22/2023   SUBJECTIVE:   HPI:  Adam Ray presents today for follow up.   Diabetes - currently taking lantus  20u daily. Fasting sugars are running 240s-300s. Previously was on metformin  but stopped it because he didn't want to take it. No low sugars. Reports he has not recently been seen at the [facility].  Leg issues - has followed regularly with ortho Dr. Harden after amputation. Patient reports concern that he has a blood clot in his leg. On further questioning I think he is referring to the fact that he had a postoperative hematoma. He and his wife say he has not discussed these concerns recently with Dr. Harden.  Constipation - requests miralax  and colace to help with constipation  OBJECTIVE:   BP (!) 100/58   Pulse 64   Ht 5' 10 (1.778 m)   Wt 145 lb 9.6 oz (66 kg)   SpO2 99%   BMI 20.89 kg/m  Gen: no acute distress, cooperative HEENT: normocephalic, atraumatic  Heart: regular rate and rhythm Lungs: clear to auscultation bilaterally, normal work of breathing  Neuro: alert nonfocal, speech normal Ext: LLE with bandage over lying BKA stump. Removed, no signs of infection. No tenderness of stump, knee, or thigh. No warmth, swelling, or erythema appreciated.  ASSESSMENT/PLAN:   Assessment & Plan Type 2 diabetes mellitus with diabetic mononeuropathy, with long-term current use of insulin  (HCC) Uncontrolled with A1c of 10.7 Increase lantus  to 25u daily Referring to nutritionist at patient's request Advised to let me know what sugars are doing fasting in 1 week Routine adult health maintenance UACR today Urinary incontinence, unspecified type Refilled prostate medications at patient's request - flomax  and finasteride  Had previously been told to stop tolterodine due to urinary retention based on recent urology notes Status post bilateral below knee amputation (HCC) No signs of DVT on my exam today - no warmth, tenderness, or swelling Recommend he follow up with  orthopedist to review his concerns Constipation, unspecified constipation type Rx miralax  and colace    FOLLOW UP: Follow up in 1 week by phone with sugars Referring to nutritionist Follow up with orthopedics  [place] J. Donah, MD [facility]

## 2023-12-22 NOTE — Patient Instructions (Signed)
 It was great to see you again today.  Go up to 25units daily of lantus  Call in 1 week with what your fasting blood sugars are running  Referring to nutritionist Checking bloodwork and urine  Stop tolterodine (per your urologist) Refilled flomax  and finasteride  for you  Please call Dr. Crist office regarding the concerns about your leg.  Be well, Dr. Donah     Prenatal Classes Go to [email or web address] for more information on the pregnancy, breastfeeding, and child birth classes that [place] has to offer.   Cost of Prenatal Genetic Testing: For information on cost of genetic testing bloodwork through the company [employer], please call or text Morna Misty at [phone].  To enroll in [facility] Assistance, please call [phone] Ask to speak with Eric. He speaks [place].  Eric is located at: [address] [place], [place]  His office is open Tuesday, Wednesday, Thursday from 8:30a to 4:30p (Closed Monday & Friday)  Vaccinations If you are with the [employer] and need vaccines, these are done the [facility] on [address] [place].  The number to call to schedule an appointment is [phone].  Safe Medications in Pregnancy   Acne:  Benzoyl Peroxide  Salicylic Acid    Backache/Headache:  Tylenol : 2 regular strength every 4 hours OR               2 extra strength every 6 hours  ** Do not exceed 4000 mg of tylenol  in 24 hours   Colds/Coughs/Allergies:  Benadryl  (alcohol free) 25 mg every 6 hours as needed  Breath right strips  Claritin   Cepacol throat lozenges  Chloraseptic throat spray  Cold-Eeze- up to three times per day  Cough drops, alcohol free  Flonase  (by prescription only)  Guaifenesin   Mucinex   Robitussin DM (plain only, alcohol free)  Saline nasal spray/drops  Sudafed (pseudoephedrine) & Actifed * use only after [redacted] weeks gestation and if you do not have high blood pressure   Tylenol   Vicks Vaporub  Zinc  lozenges  Zyrtec    Constipation:  Colace  Ducolax suppositories  Fleet enema  Glycerin suppositories  Metamucil  Milk of magnesia  Miralax   Senokot  Smooth move tea   Diarrhea:  Kaopectate  Imodium A-D  **NO pepto Bismol   Hemorrhoids:  Anusol   Anusol  HC  Preparation H  Tucks   Indigestion:  Tums  Maalox  Mylanta  Zantac  Pepcid    Insomnia:  Benadryl  (alcohol free) 25mg  every 6 hours as needed  Tylenol  PM  Unisom, no Gelcaps   Leg Cramps:  Tums  MagGel   Nausea/Vomiting:  Bonine  Dramamine  Emetrol  Ginger extract  Sea bands  Meclizine  Nausea medication to take during pregnancy:  Unisom (doxylamine succinate 25 mg tablets) Take one tablet daily at bedtime. If symptoms are not adequately controlled, the dose can be increased to a maximum recommended dose of two tablets daily (1/2 tablet in the morning, 1/2 tablet mid-afternoon and one at bedtime).  Vitamin B6 100mg  tablets. Take one tablet twice a day (up to 200 mg per day).   Skin Rashes:  Aveeno products  Benadryl  cream or 25mg  every 6 hours as needed  Calamine Lotion  1% cortisone cream   Yeast infection:  Gyne-lotrimin  7  Monistat 7   **If taking multiple medications, please check labels to avoid duplicating the same active ingredients  **Take medications as directed on the label  **Do not take medications that contain ibuprofen   or aspirin  without discussing with your physician  Preparing for Baby: Buy a rear-facing car seat. Learn how to install it in your car. Set up a safe place for baby to sleep - this should be a bassinet or crib where baby will sleep alone on their back without pillows, blankets, or stuffed animals. If you are planning to breastfeed, I encourage you to attend a breastfeeding class.

## 2023-12-23 ENCOUNTER — Ambulatory Visit: Admitting: Orthopedic Surgery

## 2023-12-23 LAB — MICROALBUMIN / CREATININE URINE RATIO
Creatinine, Urine: 63.3 mg/dL
Microalb/Creat Ratio: <5 mg/g{creat} (ref 0–29)
Microalbumin, Urine: <3 ug/mL

## 2023-12-23 LAB — BASIC METABOLIC PANEL WITH GFR
BUN/Creatinine Ratio: 15 (ref 10–24)
BUN: 15 mg/dL (ref 8–27)
CO2: 26 mmol/L (ref 20–29)
Calcium: 8.9 mg/dL (ref 8.6–10.2)
Chloride: 97 mmol/L (ref 96–106)
Creatinine, Ser: 1 mg/dL (ref 0.76–1.27)
Glucose: 327 mg/dL — ABNORMAL HIGH (ref 70–99)
Potassium: 4.7 mmol/L (ref 3.5–5.2)
Sodium: 136 mmol/L (ref 134–144)
eGFR: 82 mL/min/{1.73_m2} (ref >59)

## 2023-12-23 LAB — CBC
Hematocrit: 42.2 % (ref 37.5–51.0)
Hemoglobin: 13.2 g/dL (ref 13.0–17.7)
MCH: 25.6 pg — ABNORMAL LOW (ref 26.6–33.0)
MCHC: 31.3 g/dL — ABNORMAL LOW (ref 31.5–35.7)
MCV: 82 fL (ref 79–97)
Platelets: 101 10*3/uL — ABNORMAL LOW (ref 150–450)
RBC: 5.16 x10E6/uL (ref 4.14–5.80)
RDW: 15.7 % — ABNORMAL HIGH (ref 11.6–15.4)
WBC: 2.8 10*3/uL — ABNORMAL LOW (ref 3.4–10.8)

## 2023-12-24 ENCOUNTER — Ambulatory Visit (INDEPENDENT_AMBULATORY_CARE_PROVIDER_SITE_OTHER): Admitting: Family

## 2023-12-24 ENCOUNTER — Encounter: Payer: Self-pay | Admitting: Family

## 2023-12-24 DIAGNOSIS — Z89512 Acquired absence of left leg below knee: Secondary | ICD-10-CM | POA: Diagnosis not present

## 2023-12-24 DIAGNOSIS — K5900 Constipation, unspecified: Secondary | ICD-10-CM | POA: Insufficient documentation

## 2023-12-24 NOTE — Assessment & Plan Note (Signed)
UACR today

## 2023-12-24 NOTE — Progress Notes (Signed)
 Office Visit Note   Patient: Adam Ray           Date of Birth: [birth year]           MRN: [id number] Visit Date: 12/24/2023              Requested by: Donah Laymon PARAS, MD [address] [place],  [place] [address] PCP: Donah Laymon PARAS, MD  Chief Complaint  Patient presents with   Left Leg - Follow-up    08/27/2023 left BKA revision       HPI: The patient is a 68 year old gentleman seen in follow-up for left below-knee amputation revision.  Surgery February 26 of this year he has been slow to heal.  Today he is eager to be set up with his prosthesis has 1 remaining open area  Patient is a new left transtibial  amputee.  Patient's current comorbidities are not expected to impact the ability to function with the prescribed prosthesis. Patient verbally communicates a strong desire to use a prosthesis. Patient currently requires mobility aids to ambulate without a prosthesis.  Expects not to use mobility aids with a new prosthesis.  Patient is a K3 level ambulator that spends a lot of time walking around on uneven terrain over obstacles, up and down stairs, and ambulates with a variable cadence.     Assessment & Plan: Visit Diagnoses: No diagnosis found.  Plan: Given an order for his prosthesis set up encouraged him to call [facility] for an appointment  Continue daily Dial soap cleansing dry dressings follow-up in 4 weeks  Follow-Up Instructions: No follow-ups on file.   Ortho Exam  Patient is alert, oriented, no adenopathy, well-dressed, normal affect, normal respiratory effort. On examination left residual limb this is well consolidated medially there is 1 remaining open area this is 5 mm x 5 mm with 5 mm of depth there is scant bloody drainage no surrounding erythema no edema    Imaging: No results found. No images are attached to the encounter.  Labs: Lab Results  Component Value Date   HGBA1C 10.7 (A) 12/22/2023   HGBA1C 10.0 (H) 08/01/2023    HGBA1C 10.0 (A) 04/28/2023   ESRSEDRATE 54 (H) 01/01/2020   ESRSEDRATE 8 01/29/2016   ESRSEDRATE 9 04/11/2012   CRP 2.6 (H) 01/29/2016   CRP 0.5 02/28/2014   REPTSTATUS 09/01/2023 FINAL 08/27/2023   GRAMSTAIN  08/27/2023    RARE WBC PRESENT,BOTH PMN AND MONONUCLEAR NO ORGANISMS SEEN    CULT  08/27/2023    No growth aerobically or anaerobically. Performed at [facility], [address] [place], [place] [address]    LABORGA NO GROWTH 2 DAYS 07/19/2014     Lab Results  Component Value Date   ALBUMIN 3.4 (L) 09/09/2023   ALBUMIN 3.1 (L) 08/25/2023   ALBUMIN 2.2 (L) 08/06/2023   PREALBUMIN 18 08/26/2023   PREALBUMIN 8 (L) 08/01/2023    Lab Results  Component Value Date   MG 1.7 09/09/2023   MG 1.8 09/02/2023   MG 1.7 09/01/2023   No results found for: [facility]  Lab Results  Component Value Date   PREALBUMIN 18 08/26/2023   PREALBUMIN 8 (L) 08/01/2023      Latest Ref Rng & Units 12/22/2023   11:19 AM 09/09/2023    3:03 PM 09/02/2023    3:44 AM  CBC EXTENDED  WBC 3.4 - 10.8 x10E3/uL 2.8  4.5  3.9   RBC 4.14 - 5.80 x10E6/uL 5.16  3.73  3.01   Hemoglobin 13.0 - 17.7 g/dL 86.7  89.9  8.2   HCT 62.4 - 51.0 % 42.2  33.0  26.1   Platelets 150 - 450 x10E3/uL 101  194  129   NEUT# 1.7 - 7.7 K/uL  2.9    Lymph# 0.7 - 4.0 K/uL  1.0       There is no height or weight on file to calculate BMI.  Orders:  No orders of the defined types were placed in this encounter.  No orders of the defined types were placed in this encounter.    Procedures: No procedures performed  Clinical Data: No additional findings.  ROS:  All other systems negative, except as noted in the HPI. Review of Systems  Objective: Vital Signs: There were no vitals taken for this visit.  Specialty Comments:  No specialty comments available.  PMFS History: Patient Active Problem List   Diagnosis Date Noted   Amputation of left lower extremity below knee (HCC) 08/26/2023   Diabetes  mellitus (HCC) 08/26/2023   Left bundle branch block (LBBB) 08/26/2023   Acute blood loss anemia 08/26/2023   Dehiscence of amputation stump of left lower extremity (HCC) 08/25/2023   S/P BKA (below knee amputation) (HCC) 08/05/2023   Ganglion of foot, left 08/01/2023   S/P BKA (below knee amputation) unilateral, left (HCC) 08/01/2023   Routine adult health maintenance 04/30/2023   Heart murmur, systolic 03/13/2023   Pancytopenia (HCC) 02/18/2023   Bone lesion 02/18/2023   B12 deficiency anemia 02/18/2023   Wound dehiscence, surgical, initial encounter 12/25/2022   Maggot infestation 12/25/2022   Abnormal bone radiograph 12/18/2022   Osteomyelitis of second toe of left foot (HCC) 10/11/2022   Osteomyelitis of great toe of left foot (HCC) 10/11/2022   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   Atrophy of tongue papillae 10/02/2022   Atherosclerosis of coronary artery without angina pectoris 10/02/2022   Age-related nuclear cataract of left eye 10/02/2022   Brown hairy tongue 10/02/2022   Proliferative diabetic retinopathy of right eye associated with type 2 diabetes mellitus (HCC) 10/02/2022   Tongue discoloration 02/14/2022   Hematuria 08/16/2021   Cervical spondylosis without myelopathy 12/14/2020   Pain in joint of left shoulder 12/14/2020   Urinary incontinence 08/10/2020   Increased urinary frequency 05/09/2020   Nocturnal leg cramps 10/28/2019   Hyperlipidemia associated with type 2 diabetes mellitus (HCC) 05/26/2019   Limited mobility 05/12/2018   Anxiety state 06/02/2017   Leg swelling 01/29/2017   Idiopathic chronic venous hypertension of both lower extremities with inflammation 09/24/2016   Diabetic polyneuropathy associated with type 2 diabetes mellitus (HCC)    Type 2 diabetes mellitus with neurologic complication, with long-term current use of insulin  (HCC) 12/08/2015   Left shoulder pain 08/10/2015   Pulmonary nodule 02/01/2015    Depression 12/09/2013   Warts, genital 08/20/2013   Diabetic retinopathy (HCC) 01/28/2013   Sleep apnea 09/06/2010   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   Hypertension associated with diabetes (HCC) 11/09/2008   COPD, mild (HCC) 10/06/2006   SICKLE-CELL TRAIT 04/26/2006   ERECTILE DYSFUNCTION 04/26/2006   Tobacco use 04/26/2006   Peripheral arterial disease (HCC) 04/26/2006   Allergic rhinitis 04/26/2006   Past Medical History:  Diagnosis Date   Allergy    Arthritis    Bronchitis    Cataract    CHF (congestive heart failure) (HCC)    Chronic kidney disease    Claudication (HCC)    right foot ray  resection   Colon polyps    hyperplastic   COPD (chronic obstructive pulmonary disease) (HCC)    Coronary artery disease    Diabetes mellitus    type II   Genital warts    Gout    Hyperlipidemia    Hypertension    Myocardial infarction (HCC)    Osteomyelitis of third toe of right foot (HCC)    Pneumonia    Status post amputation of toe of right foot (HCC) 09/24/2016   Status post transmetatarsal amputation of foot, 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/2018   STEMI involving left circumflex coronary artery (HCC) 07/12/2018   Coronary artery disease   Subacute osteomyelitis, right ankle and foot (HCC) 01/29/2016   Testicular mass 04/18/2016    Family History  Problem Relation Age of Onset   Diabetes Mother    Stroke Mother    Heart failure Father    Heart attack Father    Diabetes Sister        multiple siblings   Diabetes Brother        muliple siblings   Heart disease Brother    Colon cancer Neg Hx    Esophageal cancer Neg Hx    Rectal cancer Neg Hx    Stomach cancer Neg Hx     Past Surgical History:  Procedure Laterality Date   AMPUTATION Right 01/31/2016   Procedure: Right 2nd Toe Amputation;  Surgeon: Jerona LULLA Sage, MD;  Location: [facility] OR;  Service: Orthopedics;  Laterality: Right;   AMPUTATION Right 07/08/2018   Procedure: RIGHT TRANSMETATARSAL AMPUTATION;  Surgeon: Sage Jerona LULLA,  MD;  Location: [facility] OR;  Service: Orthopedics;  Laterality: Right;   AMPUTATION Right 01/05/2020   Procedure: RIGHT BELOW KNEE AMPUTATION;  Surgeon: Sage Jerona LULLA, MD;  Location: [facility] OR;  Service: Orthopedics;  Laterality: Right;   AMPUTATION Right 02/23/2020   Procedure: RIGHT BELOW KNEE AMPUTATION REVISION;  Surgeon: Sage Jerona LULLA, MD;  Location: [facility] OR;  Service: Orthopedics;  Laterality: Right;   AMPUTATION Left 10/11/2022   Procedure: LEFT GREAT TOE AMPUTATION AND LEFT 2ND TOE AMPUTATION;  Surgeon: Sage Jerona LULLA, MD;  Location: [facility] OR;  Service: Orthopedics;  Laterality: Left;   AMPUTATION Left 12/25/2022   Procedure: LEFT TRANSMETATARSAL AMPUTATION;  Surgeon: Sage Jerona LULLA, MD;  Location: [facility] OR;  Service: Orthopedics;  Laterality: Left;   AMPUTATION Left 08/01/2023   Procedure: LEFT BELOW KNEE AMPUTATION;  Surgeon: Sage Jerona LULLA, MD;  Location: [facility] OR;  Service: Orthopedics;  Laterality: Left;   CATARACT EXTRACTION     right eye   COLONOSCOPY     CORONARY STENT INTERVENTION N/A 07/12/2018   Procedure: CORONARY STENT INTERVENTION;  Surgeon: Burnard Debby LABOR, MD;  Location: [facility] INVASIVE [facility] LAB;  Service: Cardiovascular;  Laterality: N/A;   CORONARY/GRAFT ACUTE MI REVASCULARIZATION N/A 07/12/2018   Procedure: Coronary/Graft Acute MI Revascularization;  Surgeon: Burnard Debby LABOR, MD;  Location: [facility]VAS[facility] CV LAB;  Service: Cardiovascular;  Laterality: N/A;   I & D EXTREMITY  04/11/2012   Procedure: IRRIGATION AND DEBRIDEMENT EXTREMITY;  Surgeon: Norleen Armor, MD;  Location: [facility] OR;  Service: Orthopedics;  Laterality: Right;   LEFT HEART CATH AND CORONARY ANGIOGRAPHY N/A 07/12/2018   Procedure: LEFT HEART CATH AND CORONARY ANGIOGRAPHY;  Surgeon: Burnard Debby LABOR, MD;  Location: [facility] CV LAB;  Service: Cardiovascular;  Laterality: N/A;   STUMP REVISION Left 08/27/2023   Procedure: REVISION LEFT BELOW KNEE AMPUTATION;  Surgeon: Sage Jerona LULLA, MD;  Location: [facility] OR;  Service:  Orthopedics;  Laterality:  Left;   Surgery left great toe     Tear ducts bilateral eyes     TRANSMETATARSAL AMPUTATION Right 07/08/2018   Social History   Occupational History   Occupation: disabled  Tobacco Use   Smoking status: Former    Current packs/day: 0.00    Average packs/day: 0.3 packs/day for 55.9 years (16.8 ttl pk-yrs)    Types: Cigars, Cigarettes    Start date: 07/02/1963    Quit date: 06/01/2019    Years since quitting: 4.5    Passive exposure: Past   Smokeless tobacco: Former  Building services engineer status: Former  Substance and Sexual Activity   Alcohol use: Not Currently   Drug use: No   Sexual activity: Not Currently

## 2023-12-24 NOTE — Assessment & Plan Note (Signed)
 Refilled prostate medications at patient's request - flomax  and finasteride  Had previously been told to stop tolterodine due to urinary retention based on recent urology notes

## 2023-12-24 NOTE — Assessment & Plan Note (Signed)
 Uncontrolled with A1c of 10.7 Increase lantus  to 25u daily Referring to nutritionist at patient's request Advised to let me know what sugars are doing fasting in 1 week

## 2023-12-24 NOTE — Assessment & Plan Note (Signed)
 Rx miralax  and colace

## 2023-12-24 NOTE — Assessment & Plan Note (Signed)
 No signs of DVT on my exam today - no warmth, tenderness, or swelling Recommend he follow up with orthopedist to review his concerns

## 2023-12-29 ENCOUNTER — Telehealth: Payer: Self-pay

## 2023-12-29 DIAGNOSIS — E1141 Type 2 diabetes mellitus with diabetic mononeuropathy: Secondary | ICD-10-CM

## 2023-12-29 NOTE — Telephone Encounter (Signed)
 Patient's wife LVM on nurse line regarding his lab results.   She states that she is not able to use mychart and that we either need to call her or send her a letter with these results. Patient is requesting these results as soon as possible.   Will forward to PCP.  Chiquita JAYSON English, RN

## 2023-12-31 ENCOUNTER — Ambulatory Visit: Payer: Self-pay | Admitting: Family Medicine

## 2023-12-31 MED ORDER — LANTUS SOLOSTAR 100 UNIT/ML ~~LOC~~ SOPN
30.0000 [IU] | PEN_INJECTOR | Freq: Every day | SUBCUTANEOUS | Status: AC
Start: 2023-12-31 — End: ?

## 2023-12-31 NOTE — Telephone Encounter (Signed)
 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 Recommend he follow up with heme/onc  He reports fasting sugars are still elevated, lowest was 140 this AM Advised going up to 28u of Lantus  daily, he requests to go up to 30u daily which I think is reasonable as long as he watches his sugars closely. Recommend he call me in 1 week with his latest CBGs.  Patient appreciative. He will call hematology office to schedule follow up with them.  Laymon JINNY Legions, MD

## 2024-01-16 ENCOUNTER — Other Ambulatory Visit: Payer: Self-pay | Admitting: Family Medicine

## 2024-02-06 ENCOUNTER — Emergency Department (HOSPITAL_COMMUNITY)
Admission: EM | Admit: 2024-02-06 | Discharge: 2024-02-07 | Disposition: A | Discharge status: Home / Self Care | Attending: Emergency Medicine | Admitting: Emergency Medicine

## 2024-02-06 ENCOUNTER — Other Ambulatory Visit: Payer: Self-pay

## 2024-02-06 ENCOUNTER — Encounter: Payer: Self-pay | Admitting: Hematology

## 2024-02-06 DIAGNOSIS — R739 Hyperglycemia, unspecified: Secondary | ICD-10-CM | POA: Insufficient documentation

## 2024-02-06 LAB — CBC WITH DIFFERENTIAL/PLATELET
Abs Immature Granulocytes: 0.01 K/uL (ref 0.00–0.07)
Basophils Absolute: 0 K/uL (ref 0.0–0.1)
Basophils Relative: 1 %
Eosinophils Absolute: 0.1 K/uL (ref 0.0–0.5)
Eosinophils Relative: 3 %
HCT: 39.6 % (ref 39.0–52.0)
Hemoglobin: 13 g/dL (ref 13.0–17.0)
Immature Granulocytes: 0 %
Lymphocytes Relative: 43 %
Lymphs Abs: 1.3 K/uL (ref 0.7–4.0)
MCH: 26.6 pg (ref 26.0–34.0)
MCHC: 32.8 g/dL (ref 30.0–36.0)
MCV: 81.1 fL (ref 80.0–100.0)
Monocytes Absolute: 0.4 K/uL (ref 0.1–1.0)
Monocytes Relative: 11 %
Neutro Abs: 1.3 K/uL — ABNORMAL LOW (ref 1.7–7.7)
Neutrophils Relative %: 42 %
Platelets: 103 K/uL — ABNORMAL LOW (ref 150–400)
RBC: 4.88 MIL/uL (ref 4.22–5.81)
RDW: 15.5 % (ref 11.5–15.5)
WBC: 3.2 K/uL — ABNORMAL LOW (ref 4.0–10.5)
nRBC: 0 % (ref 0.0–0.2)

## 2024-02-06 LAB — I-STAT VENOUS BLOOD GAS, ED
Acid-Base Excess: 5 mmol/L — ABNORMAL HIGH (ref 0.0–2.0)
Bicarbonate: 29.7 mmol/L — ABNORMAL HIGH (ref 20.0–28.0)
Calcium, Ion: 1.14 mmol/L — ABNORMAL LOW (ref 1.15–1.40)
HCT: 38 % — ABNORMAL LOW (ref 39.0–52.0)
Hemoglobin: 12.9 g/dL — ABNORMAL LOW (ref 13.0–17.0)
O2 Saturation: 57 %
Potassium: 3.9 mmol/L (ref 3.5–5.1)
Sodium: 137 mmol/L (ref 135–145)
TCO2: 31 mmol/L (ref 22–32)
pCO2, Ven: 45.1 mmHg (ref 44–60)
pH, Ven: 7.426 (ref 7.25–7.43)
pO2, Ven: 29 mmHg — CL (ref 32–45)

## 2024-02-06 LAB — BASIC METABOLIC PANEL WITH GFR
Anion gap: 10 (ref 5–15)
BUN: 18 mg/dL (ref 8–23)
CO2: 27 mmol/L (ref 22–32)
Calcium: 9.2 mg/dL (ref 8.9–10.3)
Chloride: 98 mmol/L (ref 98–111)
Creatinine, Ser: 1 mg/dL (ref 0.61–1.24)
GFR, Estimated: >60 mL/min (ref >60)
Glucose, Bld: 350 mg/dL — ABNORMAL HIGH (ref 70–99)
Potassium: 3.9 mmol/L (ref 3.5–5.1)
Sodium: 135 mmol/L (ref 135–145)

## 2024-02-06 LAB — CBG MONITORING, ED: Glucose-Capillary: 346 mg/dL — ABNORMAL HIGH (ref 70–99)

## 2024-02-06 NOTE — ED Notes (Signed)
 Pt unable to provide a urine sample in triage but aware that we need one.

## 2024-02-06 NOTE — ED Provider Triage Note (Signed)
 Emergency Medicine Provider Triage Evaluation Note  MARVION BASTIDAS , a 68 y.o. male  was evaluated in triage.  Pt complains of elevated blood sugar.  Review of Systems  Positive: Weakness Negative: Chest pain  Physical Exam  BP 115/62 (BP Location: Left Arm)   Pulse 69   Temp 98.1 F (36.7 C)   Resp 18   Ht 5' 10 (1.778 m)   Wt 66 kg   SpO2 94%   BMI 20.88 kg/m  Gen:   Awake, no distress   Resp:  Normal effort  MSK:   Moves extremities without difficulty  Other:    Medical Decision Making  Medically screening exam initiated at 5:16 PM.  Appropriate orders placed.  [name] was informed that the remainder of the evaluation will be completed by another provider, this initial triage assessment does not replace that evaluation, and the importance of remaining in the ED until their evaluation is complete.     Towana Ozell BROCKS, MD 02/07/24 [phone]

## 2024-02-06 NOTE — ED Triage Notes (Signed)
 Called by family for drowsiness. Found his sugar to be 402. Pt without complaints besides feeling drowsy. Reports med compliance. Hx of UTI as well : malodorous and dark in color.

## 2024-02-07 ENCOUNTER — Encounter (HOSPITAL_COMMUNITY): Payer: Self-pay

## 2024-02-07 LAB — CBG MONITORING, ED: Glucose-Capillary: 250 mg/dL — ABNORMAL HIGH (ref 70–99)

## 2024-02-07 LAB — URINALYSIS, W/ REFLEX TO CULTURE (INFECTION SUSPECTED)
Bacteria, UA: NONE SEEN
Bilirubin Urine: NEGATIVE
Glucose, UA: >=500 mg/dL — AB
Ketones, ur: 5 mg/dL — AB
Leukocytes,Ua: NEGATIVE
Nitrite: NEGATIVE
Protein, ur: NEGATIVE mg/dL
Specific Gravity, Urine: 1.021 (ref 1.005–1.030)
pH: 5 (ref 5.0–8.0)

## 2024-02-07 MED ORDER — INSULIN GLARGINE-YFGN 100 UNIT/ML ~~LOC~~ SOLN: 25.0000 [IU] | Freq: Every day | SUBCUTANEOUS | Status: DC

## 2024-02-07 MED ORDER — INSULIN GLARGINE-YFGN 100 UNIT/ML ~~LOC~~ SOLN
15.0000 [IU] | Freq: Every day | SUBCUTANEOUS | Status: DC
  Filled 2024-02-07: qty 0.15

## 2024-02-07 MED ORDER — LACTATED RINGERS IV BOLUS
1000.0000 mL | Freq: Once | INTRAVENOUS | Status: AC
  Administered 2024-02-07: 1000 mL via INTRAVENOUS

## 2024-02-07 NOTE — ED Provider Notes (Signed)
 [place] [facility] AT [facility] Provider Note   CSN: [id number] Arrival date & time: 02/06/24  1659     History Chief Complaint  Patient presents with   Hyperglycemia    HPI Adam Ray is a 68 y.o. male presenting for chief complaint of elevated blood sugar readings at home.  States that his blood sugars been higher than normal.  He states that he was recently changed from 40 units of NovoLog  split twice daily to 25 units of Lantus  once daily.  He states since then his blood sugars have been in the 4-6 100s.  His last A1c was 14.  He states he is trying to get follow-up with an endocrinologist but has been lost to follow-up. His wife at bedside states they do not know how much insulin  he supposed to be taking at this point. She states that she has been giving him 25 units of Lantus  and is quite confident in this.  Patient's recorded medical, surgical, social, medication list and allergies were reviewed in the Snapshot window as part of the initial history.   Review of Systems   Review of Systems  Constitutional:  Positive for fatigue. Negative for chills and fever.  HENT:  Negative for ear pain and sore throat.   Eyes:  Negative for pain and visual disturbance.  Respiratory:  Negative for cough and shortness of breath.   Cardiovascular:  Negative for chest pain and palpitations.  Gastrointestinal:  Negative for abdominal pain and vomiting.  Genitourinary:  Negative for dysuria and hematuria.  Musculoskeletal:  Negative for arthralgias and back pain.  Skin:  Negative for color change and rash.  Neurological:  Positive for weakness. Negative for seizures and syncope.  All other systems reviewed and are negative.   Physical Exam Updated Vital Signs BP 136/63 (BP Location: Left Arm)   Pulse (!) 57   Temp (!) 97.5 F (36.4 C) (Oral)   Resp 16   Ht 5' 10 (1.778 m)   Wt 66 kg   SpO2 100%   BMI 20.88 kg/m  Physical Exam Vitals and nursing note  reviewed.  Constitutional:      General: He is not in acute distress.    Appearance: He is well-developed.  HENT:     Head: Normocephalic and atraumatic.  Eyes:     Conjunctiva/sclera: Conjunctivae normal.  Cardiovascular:     Rate and Rhythm: Normal rate and regular rhythm.     Heart sounds: No murmur heard. Pulmonary:     Effort: Pulmonary effort is normal. No respiratory distress.     Breath sounds: Normal breath sounds.  Abdominal:     Palpations: Abdomen is soft.     Tenderness: There is no abdominal tenderness.  Musculoskeletal:        General: Deformity present. No swelling.     Cervical back: Neck supple.  Skin:    General: Skin is warm and dry.     Capillary Refill: Capillary refill takes less than 2 seconds.  Neurological:     Mental Status: He is alert.  Psychiatric:        Mood and Affect: Mood normal.      ED Course/ Medical Decision Making/ A&P    Procedures Procedures   Medications Ordered in ED Medications  insulin  glargine-yfgn (SEMGLEE ) injection 15 Units (has no administration in time range)  lactated ringers  bolus 1,000 mL (1,000 mLs Intravenous New Bag/Given 02/07/24 0304)   Medical Decision Making:   [name]  is a 68 y.o. male who presented to the ED today with altered mental status detailed above.    Additional history discussed with patient's family/caregivers.  Patient placed on continuous vitals and telemetry monitoring while in ED which was reviewed periodically.  Complete initial physical exam performed, notably the patient  was HDS in NAD.    Reviewed and confirmed nursing documentation for past medical history, family history, social history.    Initial Assessment:   With the patient's presentation of altered mental status, most likely diagnosis is delerium 2/2 infectious etiology (UTI/CAP/URI) vs metabolic abnormality (Na/K/Mg/Ca) vs nonspecific etiology. Other diagnoses were considered including (but not limited to) CVA, ICH,  intracranial mass, critical dehydration, heptatic dysfunction, uremia, hypercarbia, intoxication, endrocrine abnormality, toxidrome. These are considered less likely due to history of present illness and physical exam findings.   This is most consistent with an acute life/limb threatening illness complicated by underlying chronic conditions.  Initial Plan:  Screening labs including CBC and Metabolic panel to evaluate for infectious or metabolic etiology of disease.  Urinalysis with reflex culture ordered to evaluate for UTI or relevant urologic/nephrologic pathology.  VBG for acid/base status and further toxidrome evaulation objective evaluation as below reviewed   Initial Study Results:   Laboratory  All laboratory results reviewed without evidence of clinically relevant pathology.   Exceptions include: Hyperglycemia   Final Assessment and Plan:   Patient's lab work showed pretty substantial hyperglycemia.  He responded appropriately to additional insulin  dosing and IV fluid.  He feels much better on reassessment. Discussed with patient.  Appropriate treatment at this time would likely be to raise his insulin  dose that this should be primarily managed by his primary care provider.  Stated that we could increase him to 35 units of insulin  daily split twice daily and he should follow-up with his PCP within 72 hours for ongoing care and management. He is in no acute distress tolerating p.o. intake otherwise at his baseline on the serial assessment and he has been observed in the emergency department for 11-1/2 hours with gross resolution of all complaints. He stable for outpatient care management strict return precautions reinforced.  Disposition:  I have considered need for hospitalization, however, considering all of the above, I believe this patient is stable for discharge at this time.  Patient/family educated about specific return precautions for given chief complaint and symptoms.   Patient/family educated about follow-up with PCP.     Patient/family expressed understanding of return precautions and need for follow-up. Patient spoken to regarding all imaging and laboratory results and appropriate follow up for these results. All education provided in verbal form with additional information in written form. Time was allowed for answering of patient questions. Patient discharged.    Emergency Department Medication Summary:   Medications  insulin  glargine-yfgn (SEMGLEE ) injection 15 Units (has no administration in time range)  lactated ringers  bolus 1,000 mL (1,000 mLs Intravenous New Bag/Given 02/07/24 0304)           Clinical Impression:  1. Hyperglycemia      Discharge   Final Clinical Impression(s) / ED Diagnoses Final diagnoses:  Hyperglycemia    Rx / DC Orders ED Discharge Orders     None         Jerral Meth, MD 02/07/24 [phone]

## 2024-02-07 NOTE — ED Notes (Signed)
 This RN reviewed discharge instructions with patient. he verbalized understanding and denied any further questions. PT well appearing upon discharge and reports no pain. Pt ambulated with stable gait to exit. Pt endorses ride home and said they would get an uber.

## 2024-02-07 NOTE — Discharge Instructions (Addendum)
 You were seen today for malaise fatigue and found to have hyperglycemia.  It responded appropriately to treatment. We have discussed your ongoing care and management and I recommend that you change her Lantus  from what you have been doing which is 25 units once daily to 25 units in the morning and 10 units at night which seems to be controlling your blood sugar well at this time.  You will need to follow-up closely with your primary care doctor for ongoing care and management.

## 2024-02-10 ENCOUNTER — Encounter: Payer: Self-pay | Admitting: Student

## 2024-02-10 ENCOUNTER — Ambulatory Visit (INDEPENDENT_AMBULATORY_CARE_PROVIDER_SITE_OTHER): Admitting: Student

## 2024-02-10 VITALS — BP 112/63 | HR 64

## 2024-02-10 DIAGNOSIS — E1140 Type 2 diabetes mellitus with diabetic neuropathy, unspecified: Secondary | ICD-10-CM | POA: Diagnosis present

## 2024-02-10 DIAGNOSIS — Z794 Long term (current) use of insulin: Secondary | ICD-10-CM

## 2024-02-10 NOTE — Progress Notes (Signed)
    SUBJECTIVE:   CHIEF COMPLAINT / HPI:   Adam Ray is a 68 year old male with diabetes who presents with elevated blood sugar levels. He is accompanied by his wife, Adam Ray.  He was recently hospitalized for significantly elevated blood sugar levels. His A1c was initially reported over 13, later confirmed at 10.7. Home blood sugar monitoring shows consistent readings around 400 mg/dL in the morning and at night.  He is on an insulin  regimen with Lantus , 25 units in the morning and 10 units at night, and takes metformin  500 mg twice daily. He also uses Trulicity  weekly. His wife assists with medication management. He is concerned about frequent medication changes and discomfort with Trulicity  administration.  PERTINENT  PMH / PSH: Reviewed  OBJECTIVE:   BP 112/63   Pulse 64   SpO2 100%    Physical Exam General: Alert, well appearing, NAD Cardiovascular: RRR, No Murmurs, Normal S2/S2 Respiratory: CTAB, No wheezing or Rales Abdomen: No distension or tenderness Skin: Warm and dry  ASSESSMENT/PLAN:   Type 2 diabetes mellitus with neurologic complication, with long-term current use of insulin  (HCC) Recent hospitalization for hyperglycemia. A1c at 10.7. Home glucose consistently over 400 mg/dL. Current insulin  and metformin  regimen adhered to. Insulin  dosage adjustment needed for better control while avoiding hypoglycemia. - Increase morning Lantus  to 30 units. - Increase evening Lantus  to 15 units. - Continue metformin  500 mg twice daily. - Continue Trulicity  weekly. - Check blood glucose every morning and night before meals. - Document blood glucose readings on paper. - Follow-up with pharmacist on Monday to review glucose readings and medications. - Bring all medications at follow-up.    Adam April, MD [facility]

## 2024-02-10 NOTE — Assessment & Plan Note (Signed)
 Recent hospitalization for hyperglycemia. A1c at 10.7. Home glucose consistently over 400 mg/dL. Current insulin  and metformin  regimen adhered to. Insulin  dosage adjustment needed for better control while avoiding hypoglycemia. - Increase morning Lantus  to 30 units. - Increase evening Lantus  to 15 units. - Continue metformin  500 mg twice daily. - Continue Trulicity  weekly. - Check blood glucose every morning and night before meals. - Document blood glucose readings on paper. - Follow-up with pharmacist on Monday to review glucose readings and medications. - Bring all medications at follow-up.

## 2024-02-10 NOTE — Patient Instructions (Signed)
 Pleasure to meet you today.  I recommend increasing your morning insulin  from 25 units to 30 units and also increasing your nightly insulin  from 10 units to 15 units daily.  Please take your blood sugar as as you are currently taking them and keep a daily blood sugar logs.  Follow-up with the pharmacist (Dr. Koval) on Monday and please bring all your medications and the blood sugar logs which she will.  Continue your Trulicity  and metformin  how you are taking them.

## 2024-03-05 ENCOUNTER — Encounter: Payer: Self-pay | Admitting: Hematology

## 2024-03-12 ENCOUNTER — Ambulatory Visit: Admitting: Dietician

## 2024-04-05 ENCOUNTER — Encounter: Payer: Self-pay | Admitting: Hematology

## 2024-04-12 ENCOUNTER — Encounter

## 2024-05-03 ENCOUNTER — Encounter: Payer: Self-pay | Admitting: Radiology

## 2024-06-02 ENCOUNTER — Encounter: Payer: Self-pay | Admitting: Hematology

## 2024-08-05 ENCOUNTER — Ambulatory Visit: Admitting: Family Medicine

## 2024-08-05 ENCOUNTER — Encounter: Payer: Self-pay | Admitting: Family Medicine

## 2024-08-05 VITALS — BP 117/62 | HR 79 | Ht 70.0 in

## 2024-08-05 DIAGNOSIS — D696 Thrombocytopenia, unspecified: Secondary | ICD-10-CM

## 2024-08-05 DIAGNOSIS — E1141 Type 2 diabetes mellitus with diabetic mononeuropathy: Secondary | ICD-10-CM

## 2024-08-05 LAB — POCT GLYCOSYLATED HEMOGLOBIN (HGB A1C): HbA1c, POC (controlled diabetic range): 6.7 % (ref 0.0–7.0)

## 2024-08-05 MED ORDER — FUROSEMIDE 40 MG PO TABS: 40.0000 mg | ORAL_TABLET | Freq: Every day | ORAL | 1 refills | Status: AC

## 2024-08-05 NOTE — Progress Notes (Unsigned)
"  °  Date of Visit: 08/05/2024   SUBJECTIVE:   HPI:  Discussed the use of AI scribe software for clinical note transcription with the patient, who gave verbal consent to proceed.  History of Present Illness Adam Ray is a 69 year old male who presents for a physical exam and medication refill.  Peripheral edema and weight gain - Swelling in the legs, particularly in the knee and abdominal area - Out of Lasix  (furosemide ) for approximately 2-3 weeks; previously took 20 mg daily - Requests diuretic refill - Weight gain attributed to fluid retention  Left knee pain - Severe pain localized to the left knee - Pain sometimes prevents movement or straightening of the leg - Uncertain if pain originates from bone or other structures - Occasionally takes Tylenol  for pain relief - No recent x-rays of the knee  Diabetes mellitus management - Last recorded A1c was 6.7 - Takes Lantus  insulin : 25 units in the morning, 10 units at night - Takes metformin : two pills twice a day - Takes Trulicity  0.75 mg once a week  Visual disturbance - Difficulty with vision - Has not seen an eye doctor in years - Needs glasses suitable for current eyesight  Medication refill and review - Requests refill of Lasix  (furosemide ) - Current medications include: aspirin  81 mg daily, Pepcid  20 mg twice a day as needed, finasteride  5 mg daily, metoprolol  half a tablet twice a day, Crestor  20 mg daily, Flomax  as needed, tramadol , Gemtesa 75 mg daily - Not currently taking vitamin C , Flexeril , Colace, or Miralax   Immunization status - Interested in receiving COVID-19 and flu vaccines; not available at clinic today - Has not had a recent pneumonia shot and is unsure about the need for it      OBJECTIVE:   BP 117/62   Pulse 79   Ht 5' 10 (1.778 m)   SpO2 100%   BMI 20.88 kg/m  Gen: *** HEENT: *** Heart: *** Lungs: *** Neuro: *** Ext: ***  ASSESSMENT/PLAN:   Assessment & Plan Type 2 diabetes  mellitus with diabetic mononeuropathy, with long-term current use of insulin  (HCC)      Assessment and Plan Assessment & Plan Type 2 diabetes mellitus with diabetic mononeuropathy A1c controlled at 6.7. No hypoglycemic episodes. Current insulin  regimen effective. - Continue Lantus  25 units AM, 10 units PM. - Checked blood work including cholesterol and platelet levels.  Osteoarthritis of the left knee Chronic severe pain affecting mobility. Previous surgery. No recent imaging. - Ordered x-ray of the left knee. - Consider referral to orthopedic surgeon.  Heart failure Leg and abdominal swelling suggest fluid retention. Out of Lasix . No recent cardiology follow-up. - Refilled Lasix  20 mg daily. - Advised follow-up with cardiologist.  Thrombocytopenia Low platelet levels. - Checked platelet levels.  Urinary incontinence Managed with Flomax  and finasteride . - Continue current medications.  Constipation Reports constipation. - Recommended magnesium  supplementation.  General health maintenance Due for flu, COVID, and shingles vaccines. No pneumonia vaccine needed. - Advised flu and COVID vaccinations at pharmacy. - Recommended shingles vaccine at pharmacy.      FOLLOW UP: Follow up in *** for ***  Allison Deshotels J. Donah, MD [facility] "

## 2024-08-05 NOTE — Patient Instructions (Signed)
 It was great to see you again today.  Refilled your lasix  Schedule follow up with Dr. Harden for your knee Stay on current diabetes medications You are due for flu vaccine, COVID vaccine, and second dose of shingles vaccine Referring to eye doctor  Follow up with me in 3 months, sooner if needed  Be well, Dr. Donah

## 2024-08-06 LAB — CMP14+EGFR
ALT: 22 [IU]/L (ref 0–44)
AST: 27 [IU]/L (ref 0–40)
Albumin: 3.9 g/dL (ref 3.9–4.9)
Alkaline Phosphatase: 96 [IU]/L (ref 47–123)
BUN/Creatinine Ratio: 14 (ref 10–24)
BUN: 16 mg/dL (ref 8–27)
Bilirubin Total: 0.3 mg/dL (ref 0.0–1.2)
CO2: 21 mmol/L (ref 20–29)
Calcium: 9.2 mg/dL (ref 8.6–10.2)
Chloride: 103 mmol/L (ref 96–106)
Creatinine, Ser: 1.12 mg/dL (ref 0.76–1.27)
Globulin, Total: 2.2 g/dL (ref 1.5–4.5)
Glucose: 99 mg/dL (ref 70–99)
Potassium: 4.4 mmol/L (ref 3.5–5.2)
Sodium: 141 mmol/L (ref 134–144)
Total Protein: 6.1 g/dL (ref 6.0–8.5)
eGFR: 71 mL/min/{1.73_m2}

## 2024-08-06 LAB — CBC
Hematocrit: 36 % — ABNORMAL LOW (ref 37.5–51.0)
Hemoglobin: 11.9 g/dL — ABNORMAL LOW (ref 13.0–17.7)
MCH: 28.3 pg (ref 26.6–33.0)
MCHC: 33.1 g/dL (ref 31.5–35.7)
MCV: 86 fL (ref 79–97)
Platelets: 106 10*3/uL — ABNORMAL LOW (ref 150–450)
RBC: 4.2 x10E6/uL (ref 4.14–5.80)
RDW: 13.6 % (ref 11.6–15.4)
WBC: 3 10*3/uL — ABNORMAL LOW (ref 3.4–10.8)

## 2024-08-06 LAB — LIPID PANEL
Chol/HDL Ratio: 1.7 ratio (ref 0.0–5.0)
Cholesterol, Total: 71 mg/dL — ABNORMAL LOW (ref 100–199)
HDL: 41 mg/dL
LDL Chol Calc (NIH): 13 mg/dL (ref 0–99)
Triglycerides: 80 mg/dL (ref 0–149)
VLDL Cholesterol Cal: 17 mg/dL (ref 5–40)

## 2024-09-30 ENCOUNTER — Encounter
